# Patient Record
Sex: Male | Born: 1935
Health system: Southern US, Community
[De-identification: ages and names within clinical notes are randomized; demographics above are authoritative.]

## PROBLEM LIST (undated history)

## (undated) DIAGNOSIS — I77810 Thoracic aortic ectasia: Secondary | ICD-10-CM

## (undated) DIAGNOSIS — G250 Essential tremor: Principal | ICD-10-CM

## (undated) DIAGNOSIS — Z8719 Personal history of other diseases of the digestive system: Secondary | ICD-10-CM

## (undated) DIAGNOSIS — H3530 Unspecified macular degeneration: Secondary | ICD-10-CM

## (undated) DIAGNOSIS — G459 Transient cerebral ischemic attack, unspecified: Secondary | ICD-10-CM

## (undated) DIAGNOSIS — C801 Malignant (primary) neoplasm, unspecified: Secondary | ICD-10-CM

## (undated) DIAGNOSIS — M1990 Unspecified osteoarthritis, unspecified site: Secondary | ICD-10-CM

## (undated) DIAGNOSIS — N183 Chronic kidney disease, stage 3 unspecified: Secondary | ICD-10-CM

## (undated) DIAGNOSIS — G709 Myoneural disorder, unspecified: Secondary | ICD-10-CM

## (undated) DIAGNOSIS — I272 Pulmonary hypertension, unspecified: Secondary | ICD-10-CM

## (undated) DIAGNOSIS — I495 Sick sinus syndrome: Secondary | ICD-10-CM

## (undated) DIAGNOSIS — K469 Unspecified abdominal hernia without obstruction or gangrene: Secondary | ICD-10-CM

## (undated) DIAGNOSIS — Z950 Presence of cardiac pacemaker: Secondary | ICD-10-CM

## (undated) DIAGNOSIS — M545 Low back pain: Secondary | ICD-10-CM

## (undated) DIAGNOSIS — I480 Paroxysmal atrial fibrillation: Secondary | ICD-10-CM

## (undated) DIAGNOSIS — I5032 Chronic diastolic (congestive) heart failure: Secondary | ICD-10-CM

## (undated) DIAGNOSIS — G8929 Other chronic pain: Secondary | ICD-10-CM

## (undated) DIAGNOSIS — I499 Cardiac arrhythmia, unspecified: Secondary | ICD-10-CM

## (undated) DIAGNOSIS — K219 Gastro-esophageal reflux disease without esophagitis: Secondary | ICD-10-CM

## (undated) DIAGNOSIS — D649 Anemia, unspecified: Secondary | ICD-10-CM

## (undated) DIAGNOSIS — I2510 Atherosclerotic heart disease of native coronary artery without angina pectoris: Secondary | ICD-10-CM

## (undated) DIAGNOSIS — E785 Hyperlipidemia, unspecified: Secondary | ICD-10-CM

## (undated) DIAGNOSIS — I10 Essential (primary) hypertension: Secondary | ICD-10-CM

## (undated) DIAGNOSIS — I712 Thoracic aortic aneurysm, without rupture, unspecified: Secondary | ICD-10-CM

## (undated) DIAGNOSIS — D696 Thrombocytopenia, unspecified: Secondary | ICD-10-CM

## (undated) HISTORY — DX: Other chronic pain: G89.29

## (undated) HISTORY — DX: Thoracic aortic ectasia: I77.810

## (undated) HISTORY — DX: Essential tremor: G25.0

## (undated) HISTORY — PX: HERNIA REPAIR: SHX51

## (undated) HISTORY — DX: Pulmonary hypertension, unspecified: I27.20

## (undated) HISTORY — DX: Anemia, unspecified: D64.9

## (undated) HISTORY — DX: Low back pain: M54.5

## (undated) HISTORY — PX: APPENDECTOMY: SHX54

## (undated) HISTORY — PX: EYE SURGERY: SHX253

## (undated) HISTORY — DX: Thoracic aortic aneurysm, without rupture, unspecified: I71.20

## (undated) HISTORY — DX: Thrombocytopenia, unspecified: D69.6

## (undated) HISTORY — DX: Chronic diastolic (congestive) heart failure: I50.32

---

## 1999-05-01 ENCOUNTER — Other Ambulatory Visit: Admission: RE | Admit: 1999-05-01 | Discharge: 1999-05-01 | Payer: Self-pay | Admitting: *Deleted

## 2004-09-12 ENCOUNTER — Encounter (INDEPENDENT_AMBULATORY_CARE_PROVIDER_SITE_OTHER): Payer: Self-pay | Admitting: Specialist

## 2004-09-12 ENCOUNTER — Ambulatory Visit (HOSPITAL_COMMUNITY): Admission: RE | Admit: 2004-09-12 | Discharge: 2004-09-12 | Payer: Self-pay | Admitting: Urology

## 2006-04-24 ENCOUNTER — Encounter: Admission: RE | Admit: 2006-04-24 | Discharge: 2006-04-24 | Payer: Self-pay | Admitting: Urology

## 2006-04-28 ENCOUNTER — Ambulatory Visit (HOSPITAL_BASED_OUTPATIENT_CLINIC_OR_DEPARTMENT_OTHER): Admission: RE | Admit: 2006-04-28 | Discharge: 2006-04-28 | Payer: Self-pay | Admitting: Urology

## 2006-04-28 ENCOUNTER — Encounter (INDEPENDENT_AMBULATORY_CARE_PROVIDER_SITE_OTHER): Payer: Self-pay | Admitting: *Deleted

## 2006-06-25 ENCOUNTER — Ambulatory Visit (HOSPITAL_COMMUNITY): Admission: RE | Admit: 2006-06-25 | Discharge: 2006-06-25 | Payer: Self-pay | Admitting: Internal Medicine

## 2006-06-25 ENCOUNTER — Encounter: Payer: Self-pay | Admitting: Vascular Surgery

## 2007-04-29 ENCOUNTER — Ambulatory Visit: Payer: Self-pay | Admitting: Internal Medicine

## 2007-05-08 ENCOUNTER — Ambulatory Visit: Payer: Self-pay | Admitting: Internal Medicine

## 2007-05-15 ENCOUNTER — Ambulatory Visit: Payer: Self-pay | Admitting: Internal Medicine

## 2007-06-26 ENCOUNTER — Ambulatory Visit: Payer: Self-pay | Admitting: Internal Medicine

## 2007-10-18 ENCOUNTER — Encounter
Admission: RE | Admit: 2007-10-18 | Discharge: 2007-10-18 | Payer: Self-pay | Admitting: Physical Medicine and Rehabilitation

## 2007-10-29 ENCOUNTER — Ambulatory Visit: Payer: Self-pay | Admitting: Internal Medicine

## 2008-02-23 ENCOUNTER — Encounter: Payer: Self-pay | Admitting: Internal Medicine

## 2011-05-14 NOTE — Assessment & Plan Note (Signed)
[facility]                             PULMONARY OFFICE NOTE   Douglas Walker, Douglas Walker                   MRN:          [id number]  DATE:06/26/2007                            DOB:          [birth year]    PROBLEMS:  1. History of pneumonia.  2. Chronic bronchitis.  3. Rhinitis.   HISTORY:  Still coughing, but he remains much better than immediately  after his pneumonia. He and his wife report no new problems saying that  he is very active working outdoors all day. Dr. Kevan Ny tried changing  Propranolol to Metoprolol, but unfortunately that made no difference in  his cough which produces just scant white sputum. On careful  questioning, he denied reflux, indigestion, chest pain, or wheeze, and  he is really minimally aware of exertional dyspnea. He feels that he can  go on as he is indefinitely.   MEDICATIONS:  1. Simvastatin 40 mg.  2. Aspirin 81 mg x2.  3. Ocuvite.  4. Symbicort 160/4.5.  5. Metoprolol 25 mg b.i.d.  6. Gaviscon.   ALLERGIES:  No medication allergy.   OBJECTIVE:  Weight 244 pounds, blood pressure 136/78, pulse 54, room air  saturation 94%. Mild raspy airway rattle bilaterally, but unlabored. No  strider. No dullness. Heart sounds are regular without murmur. There is  no edema.   IMPRESSION:  Chronic bronchitis.   PLAN:  Mucinex. Continue Symbicort 160/4.5. Try sample of Singular 10  mg. Schedule return 4 months, earlier p.r.n.     Clinton D. Maple Hudson, MD, Tonny Bollman, FACP  Electronically Signed    CDY/MedQ  DD: 06/28/2007  DT: 06/29/2007  Job #: [id number]   cc:   Candyce Churn, M.D.

## 2011-05-14 NOTE — Assessment & Plan Note (Signed)
[facility]                             PULMONARY OFFICE NOTE   EVELYN, MOCH                   MRN:          [id number]  DATE:05/15/2007                            DOB:          Sep 25, 1925    PROBLEM:  1. Bronchitis, status post pneumonia.  2. Rhinitis.   HISTORY:  Mr. Petrik and his wife come back extremely pleased with his  improvement.  He is still coughing but has got his sense of energy  mostly back. He describes being able to get up, work some in his garden,  wash his car, etc.  He is not recognizing problems from the seasonal  pollens. Not coughing up much of anything.   MEDICATIONS:  Simvastatin 40 mg, aspirin 81 mg, Ocu-Vite, Symbicort  160/4.5, Propranolol 60 mg.   ALLERGIES:  No medication allergy.   OBJECTIVE:  VITAL SIGNS:  Weight 243 pounds, pulse regular 62, room air  saturation 96%.  GENERAL:  Raspy bronchitic type cough without focal dullness or wheeze,  no adenopathy, no stridor.  CARDIAC:  Heart sounds regular without murmur.   Chest x-ray on April 30 showed no acute cardiopulmonary disease, mild  hyperinflation could related to COPD and minimal left base scar.  Pulmonary function testing here on May 08, 2007:  Mild obstructive  disease mainly in small airways with insignificant response to  bronchodilator, normal lung volumes. Diffusion was moderately reduced.  His FEV1 was 2.09 (70% of predicted), and FEV1:FVC ratio was 0.66.  Diffusion was 64% of predicted.   IMPRESSION:  Residual bronchitis. He responded to the nebulizer  treatment of Depo-Medrol last time.  We discussed these and expectations  for persistent improvement with the decision that we would repeat it  once.   PLAN:  Xopenex 1.25 mg nebulizer treatments.  Depo-Medrol 80 mg IM.  Continue Symbicort.   Schedule return in 6 weeks, earlier p.r.n.     Clinton D. Maple Hudson, MD, Tonny Bollman, FACP  Electronically Signed    CDY/MedQ  DD: 05/16/2007  DT:  05/16/2007  Job #: [id number]   cc:   Candyce Churn, M.D.

## 2011-05-14 NOTE — Assessment & Plan Note (Signed)
[facility]                             PULMONARY OFFICE NOTE   Douglas Walker, Douglas Walker                   MRN:          [id number]  DATE:10/29/2007                            DOB:          [birth year]    PROBLEM:  1. History of pneumonia.  2. Chronic bronchitis.  3. Rhinitis.   HISTORY:  Four-month followup.  He is most bothered currently by  degenerative disc disease with MRI and pending epidural injection.  Occasional cough, not often, produces only scant white sputum.  He  plans flu vaccine at Dr. Isac Walker office.  No acute respiratory issues  related to the fall weather.  He seems satisfied with his pulmonary  status currently.   MEDICATIONS:  1. Simvastatin 40 mg.  2. Aspirin 81 mg x2.  3. Ocuvite.  4. Symbicort 160/4.5.  5. Metoprolol 25 mg b.i.d.  6. Gaviscon p.r.n.  7. Hydrocodone for his back.  8. He has not been using an acute respiratory inhaler.   ALLERGIES:  No medication allergies.   CLINICAL DATA:  Chest x-ray in April showed no acute cardiopulmonary  disease with mild hyperinflation, minimal left base scar.   OBJECTIVE:  VITAL SIGNS:  Weight 252 pounds, noting further increase.  Blood pressure 130/78.  Pulse 52.  Room air saturation 98%.  Pulse is  regular without murmur or gallop.  NECK:  There is no neck vein distention, adenopathy or peripheral edema.  CHEST:  Is very quiet.  I do not hear rales, rhonchi or wheeze.   IMPRESSION:  Chronic bronchitis, fairly well controlled, currently.  He  is very sensitive to cough because of his back trouble, which is a good  measure that he is not having significant bronchitis symptoms currently.  He has felt stable enough that I have suggested that he follow for his  primary care with Dr. Kevan Walker and return to see me p.r.n. He thought this  was a good approach.  I will be happy to see him if I can be helpful.     Douglas D. Maple Hudson, MD, Douglas Walker, FACP  Electronically Signed    CDY/MedQ  DD: 11/01/2007  DT: 11/02/2007  Job #: [id number]   cc:   Douglas Walker, M.D.

## 2011-05-14 NOTE — Assessment & Plan Note (Signed)
[facility]                             PULMONARY OFFICE NOTE   Douglas Walker, Douglas Walker                   MRN:          [id number]  DATE:04/29/2007                            DOB:          [birth year]    PULMONARY CONSULTATION:   PROBLEM:  [age]-year-old man seen at the kind request of Dr.  Kevan Ny in pulmonary consultation because of persistent cough and  bronchitis syndrome after pneumonia.   HISTORY:  His wife and son are patients here.  He denies significant  respiratory complaints before a pneumonia diagnosed by Dr. Kevan Ny in  January.  He has had several office visits since, multiple antibiotics,  at least 1 predinsone taper.  He has never really cleared, although he  did feel better.  He complains that he gives out.  This weekend he was  doing better, some gardening.  He still feels a rattle and congestion,  especially supine, and his wife says he will wheeze.  To avoid this, he  has tried to sleep propped up.  Phlegm has never been more than trace-  yellow, although cough has remained productive.  There has been no blood  and no fever, at least recently.  Chest just feels somewhat tight.  He  has had no chest pain.  He had a stress test by Dr. Katrinka Blazing fine.  No  fevers or sweats, no gasping, but he feels that he is using just part of  his lung.  He has never had an experience like this.   MEDICATIONS:  1. Simvastatin 40 mg.  2. Aspirin 81 mg x2.  3. Ocuvite two daily.  4. Benicar HCT 12.5 mg.  5. Symbicort 160/4.5.  6. P.r.n. use of Gaviscon and Zyrtec.   ALLERGIES:  No medication allergy.   REVIEW OF SYSTEMS:  Dyspnea with rest and activity, productive cough,  some anorexia, nasal congestion, no real pain, no palpitation,  adenopathy, purulent or bloody discharge.   PAST HISTORY:  1. Esophageal reflux treated with Gaviscon.  2. Bladder cancer resected in the past by Dr. Isabel Caprice, now clear.  3. Elevated cholesterol.  4. No prior  pneumonia or other lung disease and no history of      allergies.  5. He has had pneumococcal vaccine.  6. Appendectomy.  7. No intolerance to Latex, contrast dye or aspirin.   SOCIAL HISTORY:  He quit smoking in 1975.  Married with grown children.  He is retired from work as a Lawyer.  No exposure to  asbestos.   FAMILY HISTORY:  Wife is on chemotherapy for cervical cancer, metastatic  to lung.  Son Brett Canales is our patient with allergy problems.  No blood  relatives with other respiratory complaints.   OBJECTIVE:  Weight 240 pounds.  BP 136/80, pulse regular at 72, room air  saturation 95%.  This is an obese man with a rattling, wheezy, congested cough, but not  using accessory muscles.  There is no stridor, no dullness or pleural rub.  Heart sounds are regular.  I do not hear a murmur or gallop.  There is  no neck vein  distention and no peripheral edema.  I do not find adenopathy.  He does some have moderate nasal congestion with no visible postnasal  drainage.   CHEST X-RAY:  He brings films from [facility] [place].  Chest film of  April of this year shows faint residual infiltrate at the left base,  improved from March.  On the earliest films, there is a suggestion of a  little bit of interstitial prominence in the left base.  The overall  impression would be of a resolving pneumonia, but with some question of  underlying disease.   IMPRESSION:  1. Resolving pneumonia with residual bronchitis.  2. Watch for underlying disease, left lower lobe, in this man who quit      smoking 30 years ago.  3. Rhinitis with uncertain components of sinusitis versus seasonal      allergy.   PLAN:  1. Nebulizer treatments with Xopenex 1.25 mg with Depo-Medrol 80 mg      IM.  2. Doxycycline for 10 days.  3. Chest x-ray.  4. Schedule PFT.  5. He was using Symbicort just one puff b.i.d. and I have asked him to      increase to two puffs b.i.d. with mouth care.  6. Continue  Zyrtec if helpful.  7. Schedule return in 3 weeks, earlier p.r.n.   I appreciate the chance to meet him and hope we can be helpful.     Clinton D. Maple Hudson, MD, Tonny Bollman, FACP  Electronically Signed    CDY/MedQ  DD: 04/29/2007  DT: 04/30/2007  Job #: [id number]   cc:   Candyce Churn, M.D.

## 2011-05-17 NOTE — Op Note (Signed)
NAME:  Douglas Walker, Douglas Walker NO.:  [id number]   MEDICAL RECORD NO.:  [id number]          PATIENT TYPE:  AMB   LOCATION:  [facility]                         FACILITY:  [facility]   PHYSICIAN:  Valetta Fuller, M.D.  DATE OF BIRTH:  08-10-1925   DATE OF PROCEDURE:  04/28/2006  DATE OF DISCHARGE:                                 OPERATIVE REPORT   PREOPERATIVE DIAGNOSIS:  History of transitional cell carcinoma with recent  atypical urine cytology.   POSTOPERATIVE DIAGNOSIS:  History of transitional cell carcinoma with recent  atypical urine cytology.   OPERATION/PROCEDURE:  Cystoscopy, bilateral retrograde pyelogram, bladder  washings for cytology with random biopsies x3 and fulguration.   SURGEON:  Valetta Fuller, M.D.   ANESTHESIA:  General.   INDICATIONS:  Mr. Doro has had a history of transitional cell carcinoma  of the bladder.  He has had tumor originally diagnosed in September 2005.  The tumor was low grade and noninvasive.  The patient has never had a  recurrence.  Recent cystoscopy was unremarkable but urine cytology showed  moderate atypia.  We felt that he needed further evaluation to make sure  there was no obvious issues.   TECHNIQUE AND FINDINGS:  The patient was brought to the operating room where  he had the successful induction of general anesthesia.  He was placed in the  lithotomy position and prepped and draped in the usual manner.  Cystoscopy  revealed moderate trilobar hyperplasia and some chronic mild trabecular  change.   Attention was turned to retrograde pyelography.  A [place] cone-tipped  catheter was used.  Real time fluoroscopy was used for bilateral retrograde  pyelograms.  Both ureters and collecting systems were noted to be delicate  without evidence of obstruction, filling defector other abnormalities.  There were interpreted in real time.   Attention was then turned to the bladder itself.  Saline barbotage was  performed and sent for  cytology.  We then took cold cut biopsies of the  trigone, right and left lateral walls of the bladder which were then  fulgurated.  The patient appeared to tolerate the procedure well.  There  were no obvious complications or difficulties.           ______________________________  Valetta Fuller, M.D.  Electronically Signed     DSG/MEDQ  D:  04/28/2006  T:  04/29/2006  Job:  [id number]

## 2011-05-17 NOTE — Op Note (Signed)
NAME:  Douglas Walker, Douglas Walker                      ACCOUNT NO.:  [id number]   MEDICAL RECORD NO.:  [id number]                   PATIENT TYPE:  AMB   LOCATION:  DAY                                  [facility]ACILITY:  [facility]   PHYSICIAN:  Valetta Fuller, M.D.               DATE OF BIRTH:  10/29/1925   DATE OF PROCEDURE:  09/12/2004  DATE OF DISCHARGE:                                 OPERATIVE REPORT   PREOPERATIVE DIAGNOSIS:  Bladder tumor.   POSTOPERATIVE DIAGNOSIS:  Bladder tumor.   PROCEDURE PERFORMED:  1.  Cystoscopy.  2.  Transurethral resection of bladder tumor (2 cm).   SURGEON:  Valetta Fuller, M.D.   RESIDENT:  Thyra Breed, M.D.   ANESTHESIA:  General endotracheal anesthesia.   DRAINS:  20 French Foley catheter straight drain.   COMPLICATIONS:  None.   INDICATIONS FOR PROCEDURE:  Douglas Walker is a 77 year old male who was found  on a hematuria evaluation to have evidence of a bladder tumor.  Specifically, CT scan of his upper tracts was normal.  Office cystoscopy  demonstrated a papillary appearing tumor near the right ureteral orifice,  approximately 2 cm.  He is brought to the operating room at this time to  undergo transurethral resection of his tumor.  In addition, he understands  we may have to place a stent given the proximity of the tumor to his  ureteral orifice.  He understands the risks, benefits, and alternatives of  the procedure including bleeding, infection, damage to adjacent structures,  failure of the procedure, damage to the ureter, as well as risks of  anesthesia.  Informed consent was obtained.   PROCEDURE IN DETAIL:  Following identification by his arm bracelet, the  patient was brought to the operating room and placed in supine position.  Here, he underwent successful induction of general endotracheal anesthesia  and received preoperative IV antibiotics.  He was then moved to the dorsal  lithotomy position.  We initially placed a 12 degree cystoscopic  lens  through a 22.5 French sheath transurethrally.  This revealed a normal  appearing anterior and posterior urethra.  Upon entering the bladder, there  was bleeding noted which was coming from the base of the papillary tumor  just posterolateral to the right ureteral orifice.  Further cystoscopic  investigation of the bladder revealed no other evidence of tumors, mucosal  irregularities, bodies or stones.  The cystoscope was then removed.  Sissy Hoff sounds were then used to dilate to approximately 30 [place].  The  resectoscope sheath with obturator was then placed transurethrally into the  bladder.  The resectoscope was then assembled and continuous flow utilized.  In fact, upon initially draining the bladder through the resectoscope  sheath, most of the friable tumor exited through the resectoscope sheath.  Upon re-entry into the bladder, there was a small base of the papillary  stalk left posterolateral to the right ureteral  orifice.  Using the loop, a  deep bite of mucosa including muscle was taken for pathologic analysis.  The  edges were then fulgurated using the loop cautery.  There was no further  evidence of bleeding.  Indigo carmine had been given preoperatively and the  right ureteral orifice was seen to be effluxing without difficulty,  therefore, with no damage to the right ureteral orifice.  Therefore, we  elected not to place a stent.  However, we did elect to place a Foley  catheter postoperatively given the deep bite taken for biopsy.  The bladder  was then drained, the resectoscope removed, a 20 French Foley catheter was  placed.  15 mL of fluid were used to fill the balloon.  The Foley catheter  was then irrigated.  A few small blood clots were removed and the irrigant  was seen to be clear.  This marked the termination of the procedure.  The  patient tolerated the procedure well and there were no complications.  Please note that Dr. Isabel Caprice was present and participated  in the entire  procedure as he was the responsible surgeon.   DISPOSITION:  After awakening from general anesthesia, the patient was  transferred to the post anesthesia care unit in stable condition.  From  here, he will be transferred to home.  He is asked to call [phone] for a  return appointment with Dr. Isabel Caprice at which time his catheter will be  removed.  He is given a prescription for Vicodin, #30, as well as Cipro to  begin one day prior to his appointment in preparation for Foley catheter  removal.     Thyra Breed, MD                            Valetta Fuller, M.D.    EG/MEDQ  D:  09/12/2004  T:  09/12/2004  Job:  [id number]

## 2012-01-27 DIAGNOSIS — Z7901 Long term (current) use of anticoagulants: Secondary | ICD-10-CM | POA: Diagnosis not present

## 2012-01-27 DIAGNOSIS — R5383 Other fatigue: Secondary | ICD-10-CM | POA: Diagnosis not present

## 2012-01-27 DIAGNOSIS — R059 Cough, unspecified: Secondary | ICD-10-CM | POA: Diagnosis not present

## 2012-01-27 DIAGNOSIS — I4891 Unspecified atrial fibrillation: Secondary | ICD-10-CM | POA: Diagnosis not present

## 2012-01-27 DIAGNOSIS — R5381 Other malaise: Secondary | ICD-10-CM | POA: Diagnosis not present

## 2012-01-27 DIAGNOSIS — I10 Essential (primary) hypertension: Secondary | ICD-10-CM | POA: Diagnosis not present

## 2012-01-27 DIAGNOSIS — I5030 Unspecified diastolic (congestive) heart failure: Secondary | ICD-10-CM | POA: Diagnosis not present

## 2012-01-27 DIAGNOSIS — R05 Cough: Secondary | ICD-10-CM | POA: Diagnosis not present

## 2012-02-03 DIAGNOSIS — R5381 Other malaise: Secondary | ICD-10-CM | POA: Diagnosis not present

## 2012-02-03 DIAGNOSIS — I10 Essential (primary) hypertension: Secondary | ICD-10-CM | POA: Diagnosis not present

## 2012-02-03 DIAGNOSIS — R059 Cough, unspecified: Secondary | ICD-10-CM | POA: Diagnosis not present

## 2012-02-03 DIAGNOSIS — R05 Cough: Secondary | ICD-10-CM | POA: Diagnosis not present

## 2012-02-03 DIAGNOSIS — I5030 Unspecified diastolic (congestive) heart failure: Secondary | ICD-10-CM | POA: Diagnosis not present

## 2012-02-11 DIAGNOSIS — I4891 Unspecified atrial fibrillation: Secondary | ICD-10-CM | POA: Diagnosis not present

## 2012-02-11 DIAGNOSIS — Z7901 Long term (current) use of anticoagulants: Secondary | ICD-10-CM | POA: Diagnosis not present

## 2012-02-14 DIAGNOSIS — Z7901 Long term (current) use of anticoagulants: Secondary | ICD-10-CM | POA: Diagnosis not present

## 2012-02-14 DIAGNOSIS — I4891 Unspecified atrial fibrillation: Secondary | ICD-10-CM | POA: Diagnosis not present

## 2012-02-24 DIAGNOSIS — H353290 Exudative age-related macular degeneration, unspecified eye, stage unspecified: Secondary | ICD-10-CM | POA: Diagnosis not present

## 2012-02-24 DIAGNOSIS — H35059 Retinal neovascularization, unspecified, unspecified eye: Secondary | ICD-10-CM | POA: Diagnosis not present

## 2012-02-25 DIAGNOSIS — I4891 Unspecified atrial fibrillation: Secondary | ICD-10-CM | POA: Diagnosis not present

## 2012-02-25 DIAGNOSIS — Z7901 Long term (current) use of anticoagulants: Secondary | ICD-10-CM | POA: Diagnosis not present

## 2012-03-17 DIAGNOSIS — I4891 Unspecified atrial fibrillation: Secondary | ICD-10-CM | POA: Diagnosis not present

## 2012-03-17 DIAGNOSIS — Z7901 Long term (current) use of anticoagulants: Secondary | ICD-10-CM | POA: Diagnosis not present

## 2012-04-07 DIAGNOSIS — N3020 Other chronic cystitis without hematuria: Secondary | ICD-10-CM | POA: Diagnosis not present

## 2012-04-07 DIAGNOSIS — N401 Enlarged prostate with lower urinary tract symptoms: Secondary | ICD-10-CM | POA: Diagnosis not present

## 2012-04-07 DIAGNOSIS — Z8551 Personal history of malignant neoplasm of bladder: Secondary | ICD-10-CM | POA: Diagnosis not present

## 2012-04-07 DIAGNOSIS — N138 Other obstructive and reflux uropathy: Secondary | ICD-10-CM | POA: Diagnosis not present

## 2012-04-13 DIAGNOSIS — Z7901 Long term (current) use of anticoagulants: Secondary | ICD-10-CM | POA: Diagnosis not present

## 2012-04-13 DIAGNOSIS — I4891 Unspecified atrial fibrillation: Secondary | ICD-10-CM | POA: Diagnosis not present

## 2012-04-27 DIAGNOSIS — H353290 Exudative age-related macular degeneration, unspecified eye, stage unspecified: Secondary | ICD-10-CM | POA: Diagnosis not present

## 2012-04-27 DIAGNOSIS — H35059 Retinal neovascularization, unspecified, unspecified eye: Secondary | ICD-10-CM | POA: Diagnosis not present

## 2012-05-11 DIAGNOSIS — I4891 Unspecified atrial fibrillation: Secondary | ICD-10-CM | POA: Diagnosis not present

## 2012-05-11 DIAGNOSIS — Z7901 Long term (current) use of anticoagulants: Secondary | ICD-10-CM | POA: Diagnosis not present

## 2012-06-05 DIAGNOSIS — I4891 Unspecified atrial fibrillation: Secondary | ICD-10-CM | POA: Diagnosis not present

## 2012-06-05 DIAGNOSIS — Z7901 Long term (current) use of anticoagulants: Secondary | ICD-10-CM | POA: Diagnosis not present

## 2012-06-25 DIAGNOSIS — H353290 Exudative age-related macular degeneration, unspecified eye, stage unspecified: Secondary | ICD-10-CM | POA: Diagnosis not present

## 2012-06-25 DIAGNOSIS — H35059 Retinal neovascularization, unspecified, unspecified eye: Secondary | ICD-10-CM | POA: Diagnosis not present

## 2012-06-29 DIAGNOSIS — Z7901 Long term (current) use of anticoagulants: Secondary | ICD-10-CM | POA: Diagnosis not present

## 2012-06-29 DIAGNOSIS — I4891 Unspecified atrial fibrillation: Secondary | ICD-10-CM | POA: Diagnosis not present

## 2012-07-27 DIAGNOSIS — Z7901 Long term (current) use of anticoagulants: Secondary | ICD-10-CM | POA: Diagnosis not present

## 2012-07-27 DIAGNOSIS — I4891 Unspecified atrial fibrillation: Secondary | ICD-10-CM | POA: Diagnosis not present

## 2012-08-10 DIAGNOSIS — Z7901 Long term (current) use of anticoagulants: Secondary | ICD-10-CM | POA: Diagnosis not present

## 2012-08-10 DIAGNOSIS — I4891 Unspecified atrial fibrillation: Secondary | ICD-10-CM | POA: Diagnosis not present

## 2012-08-10 DIAGNOSIS — I5030 Unspecified diastolic (congestive) heart failure: Secondary | ICD-10-CM | POA: Diagnosis not present

## 2012-08-10 DIAGNOSIS — I10 Essential (primary) hypertension: Secondary | ICD-10-CM | POA: Diagnosis not present

## 2012-08-19 DIAGNOSIS — I10 Essential (primary) hypertension: Secondary | ICD-10-CM | POA: Diagnosis not present

## 2012-08-19 DIAGNOSIS — Z0000 Encounter for general adult medical examination without abnormal findings: Secondary | ICD-10-CM | POA: Diagnosis not present

## 2012-08-19 DIAGNOSIS — I4891 Unspecified atrial fibrillation: Secondary | ICD-10-CM | POA: Diagnosis not present

## 2012-08-19 DIAGNOSIS — Z1331 Encounter for screening for depression: Secondary | ICD-10-CM | POA: Diagnosis not present

## 2012-08-19 DIAGNOSIS — E782 Mixed hyperlipidemia: Secondary | ICD-10-CM | POA: Diagnosis not present

## 2012-08-19 DIAGNOSIS — C44319 Basal cell carcinoma of skin of other parts of face: Secondary | ICD-10-CM | POA: Diagnosis not present

## 2012-08-19 DIAGNOSIS — I5030 Unspecified diastolic (congestive) heart failure: Secondary | ICD-10-CM | POA: Diagnosis not present

## 2012-08-19 DIAGNOSIS — N400 Enlarged prostate without lower urinary tract symptoms: Secondary | ICD-10-CM | POA: Diagnosis not present

## 2012-08-24 DIAGNOSIS — I4891 Unspecified atrial fibrillation: Secondary | ICD-10-CM | POA: Diagnosis not present

## 2012-08-24 DIAGNOSIS — Z7901 Long term (current) use of anticoagulants: Secondary | ICD-10-CM | POA: Diagnosis not present

## 2012-08-26 DIAGNOSIS — H35059 Retinal neovascularization, unspecified, unspecified eye: Secondary | ICD-10-CM | POA: Diagnosis not present

## 2012-08-26 DIAGNOSIS — H353290 Exudative age-related macular degeneration, unspecified eye, stage unspecified: Secondary | ICD-10-CM | POA: Diagnosis not present

## 2012-08-27 DIAGNOSIS — I10 Essential (primary) hypertension: Secondary | ICD-10-CM | POA: Diagnosis not present

## 2012-09-04 DIAGNOSIS — H353290 Exudative age-related macular degeneration, unspecified eye, stage unspecified: Secondary | ICD-10-CM | POA: Diagnosis not present

## 2012-09-07 DIAGNOSIS — E782 Mixed hyperlipidemia: Secondary | ICD-10-CM | POA: Diagnosis not present

## 2012-09-11 DIAGNOSIS — I4891 Unspecified atrial fibrillation: Secondary | ICD-10-CM | POA: Diagnosis not present

## 2012-09-11 DIAGNOSIS — Z7901 Long term (current) use of anticoagulants: Secondary | ICD-10-CM | POA: Diagnosis not present

## 2012-09-14 DIAGNOSIS — C44319 Basal cell carcinoma of skin of other parts of face: Secondary | ICD-10-CM | POA: Diagnosis not present

## 2012-10-16 DIAGNOSIS — E782 Mixed hyperlipidemia: Secondary | ICD-10-CM | POA: Diagnosis not present

## 2012-10-16 DIAGNOSIS — Z7901 Long term (current) use of anticoagulants: Secondary | ICD-10-CM | POA: Diagnosis not present

## 2012-10-16 DIAGNOSIS — I4891 Unspecified atrial fibrillation: Secondary | ICD-10-CM | POA: Diagnosis not present

## 2012-10-23 DIAGNOSIS — I4891 Unspecified atrial fibrillation: Secondary | ICD-10-CM | POA: Diagnosis not present

## 2012-10-23 DIAGNOSIS — Z7901 Long term (current) use of anticoagulants: Secondary | ICD-10-CM | POA: Diagnosis not present

## 2012-10-28 DIAGNOSIS — H353290 Exudative age-related macular degeneration, unspecified eye, stage unspecified: Secondary | ICD-10-CM | POA: Diagnosis not present

## 2012-11-06 DIAGNOSIS — Z7901 Long term (current) use of anticoagulants: Secondary | ICD-10-CM | POA: Diagnosis not present

## 2012-11-06 DIAGNOSIS — I4891 Unspecified atrial fibrillation: Secondary | ICD-10-CM | POA: Diagnosis not present

## 2012-12-04 DIAGNOSIS — Z23 Encounter for immunization: Secondary | ICD-10-CM | POA: Diagnosis not present

## 2012-12-04 DIAGNOSIS — Z7901 Long term (current) use of anticoagulants: Secondary | ICD-10-CM | POA: Diagnosis not present

## 2012-12-04 DIAGNOSIS — I4891 Unspecified atrial fibrillation: Secondary | ICD-10-CM | POA: Diagnosis not present

## 2012-12-09 DIAGNOSIS — H35059 Retinal neovascularization, unspecified, unspecified eye: Secondary | ICD-10-CM | POA: Diagnosis not present

## 2012-12-09 DIAGNOSIS — H35359 Cystoid macular degeneration, unspecified eye: Secondary | ICD-10-CM | POA: Diagnosis not present

## 2012-12-09 DIAGNOSIS — H353290 Exudative age-related macular degeneration, unspecified eye, stage unspecified: Secondary | ICD-10-CM | POA: Diagnosis not present

## 2013-01-04 DIAGNOSIS — Z7901 Long term (current) use of anticoagulants: Secondary | ICD-10-CM | POA: Diagnosis not present

## 2013-01-04 DIAGNOSIS — I4891 Unspecified atrial fibrillation: Secondary | ICD-10-CM | POA: Diagnosis not present

## 2013-01-04 LAB — PROTIME-INR: INR: 1.7 — AB (ref 0.9–1.1)

## 2013-01-26 DIAGNOSIS — I4891 Unspecified atrial fibrillation: Secondary | ICD-10-CM | POA: Diagnosis not present

## 2013-01-26 DIAGNOSIS — Z7901 Long term (current) use of anticoagulants: Secondary | ICD-10-CM | POA: Diagnosis not present

## 2013-02-02 DIAGNOSIS — H35059 Retinal neovascularization, unspecified, unspecified eye: Secondary | ICD-10-CM | POA: Diagnosis not present

## 2013-02-02 DIAGNOSIS — H353290 Exudative age-related macular degeneration, unspecified eye, stage unspecified: Secondary | ICD-10-CM | POA: Diagnosis not present

## 2013-02-04 DIAGNOSIS — H353290 Exudative age-related macular degeneration, unspecified eye, stage unspecified: Secondary | ICD-10-CM | POA: Diagnosis not present

## 2013-02-04 DIAGNOSIS — H44009 Unspecified purulent endophthalmitis, unspecified eye: Secondary | ICD-10-CM | POA: Diagnosis not present

## 2013-02-04 DIAGNOSIS — H35059 Retinal neovascularization, unspecified, unspecified eye: Secondary | ICD-10-CM | POA: Diagnosis not present

## 2013-02-04 DIAGNOSIS — H35359 Cystoid macular degeneration, unspecified eye: Secondary | ICD-10-CM | POA: Diagnosis not present

## 2013-02-05 DIAGNOSIS — H44009 Unspecified purulent endophthalmitis, unspecified eye: Secondary | ICD-10-CM | POA: Diagnosis not present

## 2013-02-05 DIAGNOSIS — Z0000 Encounter for general adult medical examination without abnormal findings: Secondary | ICD-10-CM | POA: Diagnosis not present

## 2013-02-05 DIAGNOSIS — H20049 Secondary noninfectious iridocyclitis, unspecified eye: Secondary | ICD-10-CM | POA: Diagnosis not present

## 2013-02-08 DIAGNOSIS — H18239 Secondary corneal edema, unspecified eye: Secondary | ICD-10-CM | POA: Diagnosis not present

## 2013-02-12 ENCOUNTER — Emergency Department (INDEPENDENT_AMBULATORY_CARE_PROVIDER_SITE_OTHER): Payer: Medicare Other

## 2013-02-12 ENCOUNTER — Emergency Department (INDEPENDENT_AMBULATORY_CARE_PROVIDER_SITE_OTHER)
Admission: EM | Admit: 2013-02-12 | Discharge: 2013-02-12 | Disposition: A | Discharge status: Home / Self Care | Payer: Medicare Other | Source: Home / Self Care | Attending: Family Medicine | Admitting: Family Medicine

## 2013-02-12 ENCOUNTER — Encounter (HOSPITAL_COMMUNITY): Payer: Self-pay | Admitting: *Deleted

## 2013-02-12 DIAGNOSIS — N390 Urinary tract infection, site not specified: Secondary | ICD-10-CM

## 2013-02-12 DIAGNOSIS — R509 Fever, unspecified: Secondary | ICD-10-CM | POA: Diagnosis not present

## 2013-02-12 HISTORY — DX: Atherosclerotic heart disease of native coronary artery without angina pectoris: I25.10

## 2013-02-12 HISTORY — DX: Essential (primary) hypertension: I10

## 2013-02-12 HISTORY — DX: Hyperlipidemia, unspecified: E78.5

## 2013-02-12 LAB — CBC WITH DIFFERENTIAL/PLATELET
Basophils Absolute: 0 10*3/uL (ref 0.0–0.1)
Eosinophils Absolute: 0.1 10*3/uL (ref 0.0–0.7)
Lymphocytes Relative: 10 % — ABNORMAL LOW (ref 12–46)
Lymphs Abs: 0.9 10*3/uL (ref 0.7–4.0)
MCH: 32 pg (ref 26.0–34.0)
MCV: 93.9 fL (ref 78.0–100.0)
Monocytes Absolute: 0.6 10*3/uL (ref 0.1–1.0)
Monocytes Relative: 7 % (ref 3–12)
Neutro Abs: 6.8 10*3/uL (ref 1.7–7.7)
Neutrophils Relative %: 81 % — ABNORMAL HIGH (ref 43–77)
RBC: 4.25 MIL/uL (ref 4.22–5.81)

## 2013-02-12 LAB — POCT URINALYSIS DIP (DEVICE)
Nitrite: NEGATIVE
Protein, ur: 100 mg/dL — AB
Urobilinogen, UA: 1 mg/dL (ref 0.0–1.0)

## 2013-02-12 MED ORDER — CEPHALEXIN 500 MG PO CAPS: 500.0000 mg | ORAL_CAPSULE | Freq: Four times a day (QID) | ORAL | Status: DC

## 2013-02-12 NOTE — ED Notes (Signed)
Pt reports sudden onset of generalized body aches and extreme headache. Denies problems with urination, coughing or chest pain. Does report nighttime chills.

## 2013-02-12 NOTE — ED Provider Notes (Signed)
History     CSN: [id number]  Arrival date & time 02/12/13  1051   First MD Initiated Contact with Patient 02/12/13 1401      Chief Complaint  Patient presents with  . Generalized Body Aches    (Consider location/radiation/quality/duration/timing/severity/associated sxs/prior treatment) HPI Comments: Pt developed body aches 2 days ago.  Denies any other sx except maybe mild cough.  Simply states he does not feel good.   Patient is a 79 y.o. male presenting with general illness. The history is provided by the patient and a relative.  Illness  The current episode started 2 days ago. The onset was sudden. The problem occurs continuously. The problem has been unchanged. The problem is moderate. Nothing relieves the symptoms. Nothing aggravates the symptoms. Associated symptoms include a fever, muscle aches and cough. Pertinent negatives include no abdominal pain, no diarrhea, no nausea, no vomiting, no congestion, no ear pain, no rhinorrhea, no sore throat, no swollen glands, no neck pain, no URI, no rash, no eye discharge, no eye pain and no eye redness. He has been eating less than usual. Urine output has been normal.    Past Medical History  Diagnosis Date  . Hypertension   . Hyperlipemia   . Coronary artery disease     History reviewed. No pertinent past surgical history.  Family History  Problem Relation Age of Onset  . Family history unknown: Yes    History  Substance Use Topics  . Smoking status: Never Smoker   . Smokeless tobacco: Not on file  . Alcohol Use: No      Review of Systems  Constitutional: Positive for fever and chills.  HENT: Negative for ear pain, congestion, sore throat, rhinorrhea and neck pain.   Eyes: Negative for pain, discharge and redness.  Respiratory: Positive for cough.   Gastrointestinal: Negative for nausea, vomiting, abdominal pain and diarrhea.  Genitourinary: Negative for dysuria and flank pain.  Musculoskeletal: Positive for  myalgias.  Skin: Negative for rash.    Allergies  Review of patient's allergies indicates no known allergies.  Home Medications   Current Outpatient Rx  Name  Route  Sig  Dispense  Refill  . aspirin 81 MG tablet   Oral   Take 81 mg by mouth daily.         . cephALEXin (KEFLEX) 500 MG capsule   Oral   Take 1 capsule (500 mg total) by mouth 4 (four) times daily.   28 capsule   0   . metoprolol tartrate (LOPRESSOR) 25 MG tablet   Oral   Take 25 mg by mouth daily.         . rosuvastatin (CRESTOR) 20 MG tablet   Oral   Take 20 mg by mouth. M/w/f only         . simvastatin (ZOCOR) 40 MG tablet   Oral   Take 40 mg by mouth every evening.         . Tamsulosin HCl (FLOMAX) 0.4 MG CAPS   Oral   Take by mouth daily.         . valsartan-hydrochlorothiazide (DIOVAN-HCT) 160-25 MG per tablet   Oral   Take 1 tablet by mouth daily.         Marland Kitchen warfarin (COUMADIN) 2.5 MG tablet   Oral   Take 2.5 mg by mouth daily. 2.5 mg on m/w/f 5.0 on t/th/s/s/           BP 114/68  Pulse 66  Temp(Src) 100.8 F (  38.2 C) (Oral)  Resp 22  SpO2 97%  Physical Exam  Constitutional: He appears well-developed and well-nourished. No distress.  HENT:  Right Ear: Tympanic membrane, external ear and ear canal normal.  Left Ear: Tympanic membrane, external ear and ear canal normal.  Nose: Nose normal.  Mouth/Throat: Oropharynx is clear and moist.  Neck: Normal range of motion. Neck supple.  Cardiovascular: Normal rate and regular rhythm.   Pulmonary/Chest: Effort normal and breath sounds normal.  Abdominal: Soft. Bowel sounds are normal.  Lymphadenopathy:    He has no cervical adenopathy.  Skin: Skin is warm and dry. No rash noted.    ED Course  Procedures (including critical care time)  Labs Reviewed  CBC WITH DIFFERENTIAL - Abnormal; Notable for the following:    Platelets 80 (*)    Neutrophils Relative 81 (*)    Lymphocytes Relative 10 (*)    All other components  within normal limits  POCT URINALYSIS DIP (DEVICE) - Abnormal; Notable for the following:    Bilirubin Urine SMALL (*)    Ketones, ur TRACE (*)    Hgb urine dipstick MODERATE (*)    Protein, ur 100 (*)    Leukocytes, UA TRACE (*)    All other components within normal limits  URINE CULTURE   Dg Chest 2 View  02/12/2013  *RADIOLOGY REPORT*  Clinical Data: Fever and body aches.  CHEST - 2 VIEW  Comparison: PA and lateral chest 01/27/2012 and 08/15/2010.  Findings: The chest is hyperexpanded.  Lungs are clear.  Heart size is upper normal.  No pneumothorax or pleural effusion. The aorta is ectatic.  IMPRESSION: No acute finding.   Original Report Authenticated By: Holley Dexter, M.D.      1. UTI (lower urinary tract infection)       MDM  Pt sx c/w infection. WBC count normal, cxr normal. Urine shows some leuk and blood.  Sx could be from uti or from viral infection.  Will tx with keflex and have f/u with pcp next week. Reviewed concerning sx/reasons to go to ER.         Cathlyn Parsons, NP 02/12/13 1447

## 2013-02-13 NOTE — ED Provider Notes (Signed)
Medical screening examination/treatment/procedure(s) were performed by non-physician practitioner and as supervising physician I was immediately available for consultation/collaboration.   [facility]; MD  Sharin Grave, MD 02/13/13 [phone]

## 2013-02-15 ENCOUNTER — Telehealth (HOSPITAL_COMMUNITY): Payer: Self-pay | Admitting: *Deleted

## 2013-02-15 LAB — URINE CULTURE

## 2013-02-15 MED ORDER — SULFAMETHOXAZOLE-TRIMETHOPRIM 800-160 MG PO TABS: 1.0000 | ORAL_TABLET | Freq: Two times a day (BID) | ORAL | Status: DC

## 2013-02-15 NOTE — ED Notes (Signed)
Urine culture: 25,000 colonies E. Coli. Confirmed Extended Spectrum Beta-Lactamase producer ( ESBL).  It is resistant to Keflex.  Dr. Artis Flock notified and changed antibiotic to Septra DS.  He  e-prescribed Rx. to [facility] on [address].  I called and pt. was in the shower.  Pt.'s wife verified and given result.  Instructed her to tell him to stop the Keflex and start and take all of the Septra DS.  She said he can't drive at night but he will get it in the morning. Douglas Walker 02/15/2013

## 2013-02-22 DIAGNOSIS — I10 Essential (primary) hypertension: Secondary | ICD-10-CM | POA: Diagnosis not present

## 2013-02-22 DIAGNOSIS — N390 Urinary tract infection, site not specified: Secondary | ICD-10-CM | POA: Diagnosis not present

## 2013-02-22 DIAGNOSIS — Z79899 Other long term (current) drug therapy: Secondary | ICD-10-CM | POA: Diagnosis not present

## 2013-02-22 DIAGNOSIS — E782 Mixed hyperlipidemia: Secondary | ICD-10-CM | POA: Diagnosis not present

## 2013-02-22 DIAGNOSIS — R0789 Other chest pain: Secondary | ICD-10-CM | POA: Diagnosis not present

## 2013-02-22 DIAGNOSIS — R5381 Other malaise: Secondary | ICD-10-CM | POA: Diagnosis not present

## 2013-02-23 DIAGNOSIS — Z7901 Long term (current) use of anticoagulants: Secondary | ICD-10-CM | POA: Diagnosis not present

## 2013-02-23 DIAGNOSIS — I4891 Unspecified atrial fibrillation: Secondary | ICD-10-CM | POA: Diagnosis not present

## 2013-02-23 LAB — PROTIME-INR: INR: 3.7 — AB (ref 0.9–1.1)

## 2013-03-03 DIAGNOSIS — Z7901 Long term (current) use of anticoagulants: Secondary | ICD-10-CM | POA: Diagnosis not present

## 2013-03-03 DIAGNOSIS — I4891 Unspecified atrial fibrillation: Secondary | ICD-10-CM | POA: Diagnosis not present

## 2013-03-03 DIAGNOSIS — R0789 Other chest pain: Secondary | ICD-10-CM | POA: Diagnosis not present

## 2013-03-10 DIAGNOSIS — H353190 Nonexudative age-related macular degeneration, unspecified eye, stage unspecified: Secondary | ICD-10-CM | POA: Diagnosis not present

## 2013-03-10 DIAGNOSIS — H353290 Exudative age-related macular degeneration, unspecified eye, stage unspecified: Secondary | ICD-10-CM | POA: Diagnosis not present

## 2013-03-12 DIAGNOSIS — R5381 Other malaise: Secondary | ICD-10-CM | POA: Diagnosis not present

## 2013-03-12 DIAGNOSIS — I10 Essential (primary) hypertension: Secondary | ICD-10-CM | POA: Diagnosis not present

## 2013-03-12 DIAGNOSIS — E782 Mixed hyperlipidemia: Secondary | ICD-10-CM | POA: Diagnosis not present

## 2013-03-12 DIAGNOSIS — Z79899 Other long term (current) drug therapy: Secondary | ICD-10-CM | POA: Diagnosis not present

## 2013-03-12 DIAGNOSIS — N390 Urinary tract infection, site not specified: Secondary | ICD-10-CM | POA: Diagnosis not present

## 2013-03-12 DIAGNOSIS — I4891 Unspecified atrial fibrillation: Secondary | ICD-10-CM | POA: Diagnosis not present

## 2013-03-12 DIAGNOSIS — G609 Hereditary and idiopathic neuropathy, unspecified: Secondary | ICD-10-CM | POA: Diagnosis not present

## 2013-03-16 ENCOUNTER — Telehealth: Payer: Self-pay | Admitting: Internal Medicine

## 2013-03-16 NOTE — Telephone Encounter (Signed)
C/D 03/16/13 for appt. 04/06/13

## 2013-03-16 NOTE — Telephone Encounter (Signed)
S/W pt in re NP appt 4/9 @ 1:30 w/Dr. Arbutus Ped.  Referring Dr. Kevan Ny Dx- Abn labwork; low plts Welcome packet mailed.

## 2013-03-19 DIAGNOSIS — I4891 Unspecified atrial fibrillation: Secondary | ICD-10-CM | POA: Diagnosis not present

## 2013-03-19 DIAGNOSIS — Z7901 Long term (current) use of anticoagulants: Secondary | ICD-10-CM | POA: Diagnosis not present

## 2013-04-06 ENCOUNTER — Ambulatory Visit: Payer: Medicare Other

## 2013-04-06 ENCOUNTER — Encounter: Payer: Self-pay | Admitting: Internal Medicine

## 2013-04-06 ENCOUNTER — Other Ambulatory Visit (HOSPITAL_BASED_OUTPATIENT_CLINIC_OR_DEPARTMENT_OTHER): Payer: Medicare Other | Admitting: Lab

## 2013-04-06 ENCOUNTER — Ambulatory Visit (HOSPITAL_BASED_OUTPATIENT_CLINIC_OR_DEPARTMENT_OTHER): Payer: Medicare Other | Admitting: Internal Medicine

## 2013-04-06 ENCOUNTER — Telehealth: Payer: Self-pay | Admitting: Internal Medicine

## 2013-04-06 VITALS — BP 182/75 | HR 61 | Temp 97.0°F | Resp 20 | Ht 70.0 in | Wt 242.9 lb

## 2013-04-06 DIAGNOSIS — D696 Thrombocytopenia, unspecified: Secondary | ICD-10-CM

## 2013-04-06 DIAGNOSIS — N390 Urinary tract infection, site not specified: Secondary | ICD-10-CM | POA: Diagnosis not present

## 2013-04-06 LAB — CBC WITH DIFFERENTIAL/PLATELET
Basophils Absolute: 0 10*3/uL (ref 0.0–0.1)
EOS%: 2 % (ref 0.0–7.0)
HGB: 12 g/dL — ABNORMAL LOW (ref 13.0–17.1)
LYMPH%: 23.4 % (ref 14.0–49.0)
MCHC: 32.4 g/dL (ref 32.0–36.0)
MONO#: 0.5 10*3/uL (ref 0.1–0.9)
MONO%: 8.5 % (ref 0.0–14.0)
RBC: 3.89 10*6/uL — ABNORMAL LOW (ref 4.20–5.82)
RDW: 16 % — ABNORMAL HIGH (ref 11.0–14.6)
lymph#: 1.3 10*3/uL (ref 0.9–3.3)

## 2013-04-06 LAB — COMPREHENSIVE METABOLIC PANEL
ALT: 21 U/L (ref 0–53)
Alkaline Phosphatase: 72 U/L (ref 39–117)
BUN: 22 mg/dL (ref 6–23)
CO2: 25 mEq/L (ref 19–32)
Creatinine, Ser: 1.45 mg/dL — ABNORMAL HIGH (ref 0.50–1.35)
Potassium: 4.1 mEq/L (ref 3.5–5.3)
Sodium: 139 mEq/L (ref 135–145)
Total Bilirubin: 1.1 mg/dL (ref 0.3–1.2)

## 2013-04-06 NOTE — Progress Notes (Signed)
[facility] [phone]   [phone]) [phone]  CONSULT NOTE  REFERRING PHYSICIAN: Dr. Marden Noble  REASON FOR CONSULTATION:  79 years old white male with low platelets count  HPI Douglas Walker is a 79 y.o. male was past medical history significant for hypertension, dyslipidemia, GERD,COPD, atrial fibrillation as well as history of localized bladder cancer and TIA. The patient has been complaining recently of increasing fatigue and weakness as well as flulike symptoms and chest congestion. He was seen at one of the urgent care center and treated for urinary tract infection with Bactrim but he was allergic to it. This was switched to Keflex by his primary care physician. During his evaluation CBC was performed on 03/12/2013 and showed platelets count were down to 85,000. He was referred to me today for evaluation and recommendation regarding his low platelets count. The patient had low platelet count since 2008 but has always been above 100,000. He denied having any significant bleeding problems but he has some ecchymosis secondary to treatment with Coumadin. He has no bleeding per rectum. The patient has been on treatment for dyslipidemia with Zocor which was which it for a short period to Crestor but was discontinued recently secondary to myopathy.  He does not take any over-the-counter medication especially NSAIDs. He takes aspirin 81 mg by mouth daily. The patient denied having any significant weight loss or night sweats. He denied having any chest pain, shortness breath, cough or hemoptysis. His family history is unremarkable for any blood disease or leukemia. The patient is married and has one son, Douglas Walker who accompanied him to the visit today. He is currently retired and used to work for [employer]. He has remote history of smoking but quit more than 4 years ago, no history of alcohol or drug abuse.  @SFHPI @  Past Medical History  Diagnosis Date  .  Hypertension   . Hyperlipemia   . Coronary artery disease     History reviewed. No pertinent family history.  Social History History  Substance Use Topics  . Smoking status: Former Smoker    Quit date: 12/30/1962  . Smokeless tobacco: Not on file  . Alcohol Use: No    Allergies  Allergen Reactions  . Sulfa Antibiotics     Weakness, dizziness    Current Outpatient Prescriptions  Medication Sig Dispense Refill  . aspirin 81 MG tablet Take 81 mg by mouth daily.      . cephALEXin (KEFLEX) 500 MG capsule Take 1 capsule (500 mg total) by mouth 4 (four) times daily.  28 capsule  0  . metoprolol tartrate (LOPRESSOR) 25 MG tablet Take 25 mg by mouth daily.      . rosuvastatin (CRESTOR) 20 MG tablet Take 20 mg by mouth. M/w/f only      . Tamsulosin HCl (FLOMAX) 0.4 MG CAPS Take by mouth daily.      Marland Kitchen warfarin (COUMADIN) 2.5 MG tablet Take 2.5 mg by mouth daily. 2.5 mg on m/w/f 5.0 on t/th/s/s/       No current facility-administered medications for this visit.    Review of Systems  A comprehensive review of systems was negative except for: Constitutional: positive for fatigue  Physical Exam  AVW:UJWJX, healthy, no distress, well nourished and well developed SKIN: skin color, texture, turgor are normal HEAD: Normocephalic, No masses, lesions, tenderness or abnormalities EYES: normal EARS: External ears normal OROPHARYNX:no exudate and no erythema  NECK: supple, no adenopathy LYMPH:  no palpable lymphadenopathy, no  hepatosplenomegaly LUNGS: clear to auscultation  HEART: regular rate & rhythm and no murmurs ABDOMEN:abdomen soft, non-tender, normal bowel sounds and no masses or organomegaly BACK: Back symmetric, no curvature. EXTREMITIES:no joint deformities, effusion, or inflammation, no edema  NEURO: alert & oriented x 3 with fluent speech, no focal motor/sensory deficits  PERFORMANCE STATUS: ECOG 1  LABORATORY DATA: Lab Results  Component Value Date   WBC 5.6 04/06/2013    HGB 12.0* 04/06/2013   HCT 37.0* 04/06/2013   MCV 95.1 04/06/2013   PLT 107* 04/06/2013      Chemistry   No results found for this basename: NA, K, CL, CO2, BUN, CREATININE, GLU   No results found for this basename: CALCIUM, ALKPHOS, AST, ALT, BILITOT       RADIOGRAPHIC STUDIES: No results found.  ASSESSMENT: This is a very pleasant 79 years old white male with persistent thrombocytopenia most likely drug-induced (Zocor) plus/minus mild ITP. The patient had thrombocytopenia for more than 6 years and currently asymptomatic.   PLAN: I had a lengthy discussion with the patient and his son today about his condition. His platelets count is better today up to 107,000 I recommended for him to continue holding his Zocor for now I would see the patient back for follow up visit in one month with repeat CBC. If he has any further decline in his platelets count, I will consider the patient for further investigation but for now he is currently asymptomatic and he will continue on observation. He was advised to call me immediately if he has any concerning symptoms in the interval. All questions were answered. The patient knows to call the clinic with any problems, questions or concerns. We can certainly see the patient much sooner if necessary.  Thank you so much for allowing me to participate in the care of Douglas Walker. I will continue to follow up the patient with you and assist in his care.  I spent 30 minutes counseling the patient face to face. The total time spent in the appointment was 55 minutes.  Zyrah Wiswell K. 04/06/2013, 2:09 PM

## 2013-04-06 NOTE — Patient Instructions (Signed)
Your low platelets count are most likely secondary to medications especially Zocor. Continue to hold Zocor for now. Repeat CBC in one month.

## 2013-04-09 DIAGNOSIS — N138 Other obstructive and reflux uropathy: Secondary | ICD-10-CM | POA: Diagnosis not present

## 2013-04-09 DIAGNOSIS — Z7901 Long term (current) use of anticoagulants: Secondary | ICD-10-CM | POA: Diagnosis not present

## 2013-04-09 DIAGNOSIS — N401 Enlarged prostate with lower urinary tract symptoms: Secondary | ICD-10-CM | POA: Diagnosis not present

## 2013-04-09 DIAGNOSIS — Z8551 Personal history of malignant neoplasm of bladder: Secondary | ICD-10-CM | POA: Diagnosis not present

## 2013-04-09 DIAGNOSIS — N3020 Other chronic cystitis without hematuria: Secondary | ICD-10-CM | POA: Diagnosis not present

## 2013-04-09 DIAGNOSIS — I4891 Unspecified atrial fibrillation: Secondary | ICD-10-CM | POA: Diagnosis not present

## 2013-04-14 DIAGNOSIS — H353290 Exudative age-related macular degeneration, unspecified eye, stage unspecified: Secondary | ICD-10-CM | POA: Diagnosis not present

## 2013-04-14 DIAGNOSIS — H35059 Retinal neovascularization, unspecified, unspecified eye: Secondary | ICD-10-CM | POA: Diagnosis not present

## 2013-04-23 DIAGNOSIS — N183 Chronic kidney disease, stage 3 unspecified: Secondary | ICD-10-CM | POA: Diagnosis not present

## 2013-04-23 DIAGNOSIS — I10 Essential (primary) hypertension: Secondary | ICD-10-CM | POA: Diagnosis not present

## 2013-04-23 DIAGNOSIS — G609 Hereditary and idiopathic neuropathy, unspecified: Secondary | ICD-10-CM | POA: Diagnosis not present

## 2013-04-23 DIAGNOSIS — R946 Abnormal results of thyroid function studies: Secondary | ICD-10-CM | POA: Diagnosis not present

## 2013-04-23 DIAGNOSIS — I4891 Unspecified atrial fibrillation: Secondary | ICD-10-CM | POA: Diagnosis not present

## 2013-04-23 DIAGNOSIS — R5381 Other malaise: Secondary | ICD-10-CM | POA: Diagnosis not present

## 2013-04-23 DIAGNOSIS — E782 Mixed hyperlipidemia: Secondary | ICD-10-CM | POA: Diagnosis not present

## 2013-04-23 DIAGNOSIS — Z7901 Long term (current) use of anticoagulants: Secondary | ICD-10-CM | POA: Diagnosis not present

## 2013-04-23 DIAGNOSIS — N390 Urinary tract infection, site not specified: Secondary | ICD-10-CM | POA: Diagnosis not present

## 2013-04-23 DIAGNOSIS — R5383 Other fatigue: Secondary | ICD-10-CM | POA: Diagnosis not present

## 2013-04-23 DIAGNOSIS — D696 Thrombocytopenia, unspecified: Secondary | ICD-10-CM | POA: Diagnosis not present

## 2013-05-04 ENCOUNTER — Encounter: Payer: Self-pay | Admitting: Internal Medicine

## 2013-05-04 ENCOUNTER — Ambulatory Visit (HOSPITAL_BASED_OUTPATIENT_CLINIC_OR_DEPARTMENT_OTHER): Payer: Medicare Other | Admitting: Internal Medicine

## 2013-05-04 ENCOUNTER — Other Ambulatory Visit (HOSPITAL_BASED_OUTPATIENT_CLINIC_OR_DEPARTMENT_OTHER): Payer: Medicare Other | Admitting: Lab

## 2013-05-04 VITALS — BP 137/71 | HR 63 | Temp 98.5°F | Resp 18 | Ht 70.0 in | Wt 232.6 lb

## 2013-05-04 DIAGNOSIS — D696 Thrombocytopenia, unspecified: Secondary | ICD-10-CM

## 2013-05-04 DIAGNOSIS — N390 Urinary tract infection, site not specified: Secondary | ICD-10-CM | POA: Diagnosis not present

## 2013-05-04 LAB — CBC WITH DIFFERENTIAL/PLATELET
Basophils Absolute: 0 10*3/uL (ref 0.0–0.1)
Eosinophils Absolute: 0.2 10*3/uL (ref 0.0–0.5)
HGB: 13.1 g/dL (ref 13.0–17.1)
MCV: 94.6 fL (ref 79.3–98.0)
MONO%: 12.1 % (ref 0.0–14.0)
NEUT#: 3.1 10*3/uL (ref 1.5–6.5)
RDW: 14.7 % — ABNORMAL HIGH (ref 11.0–14.6)
lymph#: 1.3 10*3/uL (ref 0.9–3.3)

## 2013-05-04 NOTE — Progress Notes (Signed)
[facility] [phone]   [phone]  OFFICE PROGRESS NOTE  Hollice Espy, MD [address] [place] [address]  DIAGNOSIS: Thrombocytopenia most likely drug-induced plus/minus ITP  PRIOR THERAPY: None  CURRENT THERAPY: Observation   INTERVAL HISTORY: Douglas Walker 79 y.o. male returns to the clinic today for followup visit accompanied by his son. The patient is feeling fine today with no specific complaints. He denied having any significant bleeding issues. He continues to have some bruises especially in the upper extremities secondary to his Coumadin treatment. He denied having any weight loss or night sweats. He has no chest pain but continues to have shortness breath with exertion, no cough or hemoptysis. Dr. Kevan Ny discontinued his treatment with Zocor for the last few weeks. The patient is here today for reevaluation with repeat CBC.  MEDICAL HISTORY: Past Medical History  Diagnosis Date  . Hypertension   . Hyperlipemia   . Coronary artery disease     ALLERGIES:  is allergic to sulfa antibiotics.  MEDICATIONS:  Current Outpatient Prescriptions  Medication Sig Dispense Refill  . aspirin 81 MG tablet Take 81 mg by mouth daily.      . cephALEXin (KEFLEX) 500 MG capsule Take 1 capsule (500 mg total) by mouth 4 (four) times daily.  28 capsule  0  . metoprolol tartrate (LOPRESSOR) 25 MG tablet Take 25 mg by mouth daily.      . rosuvastatin (CRESTOR) 20 MG tablet Take 20 mg by mouth. M/w/f only      . Tamsulosin HCl (FLOMAX) 0.4 MG CAPS Take by mouth daily.      Marland Kitchen warfarin (COUMADIN) 2.5 MG tablet Take 2.5 mg by mouth daily. 2.5 mg on m/w/f 5.0 on t/th/s/s/       No current facility-administered medications for this visit.    REVIEW OF SYSTEMS:  A comprehensive review of systems was negative.   PHYSICAL EXAMINATION: General appearance: alert, cooperative and no distress Head: Normocephalic, without obvious  abnormality, atraumatic Neck: no adenopathy Lymph nodes: Cervical, supraclavicular, and axillary nodes normal. Resp: clear to auscultation bilaterally Cardio: regular rate and rhythm, S1, S2 normal, no murmur, click, rub or gallop GI: soft, non-tender; bowel sounds normal; no masses,  no organomegaly Extremities: extremities normal, atraumatic, no cyanosis or edema  ECOG PERFORMANCE STATUS: 1 - Symptomatic but completely ambulatory  Blood pressure 137/71, pulse 63, temperature 98.5 F (36.9 C), temperature source Oral, resp. rate 18, height 5\' 10"  (1.778 m), weight 232 lb 9.6 oz (105.507 kg).  LABORATORY DATA: Lab Results  Component Value Date   WBC 5.3 05/04/2013   HGB 13.1 05/04/2013   HCT 39.9 05/04/2013   MCV 94.6 05/04/2013   PLT 116* 05/04/2013      Chemistry      Component Value Date/Time   NA 139 04/06/2013 1322   K 4.1 04/06/2013 1322   CL 105 04/06/2013 1322   CO2 25 04/06/2013 1322   BUN 22 04/06/2013 1322   CREATININE 1.45* 04/06/2013 1322      Component Value Date/Time   CALCIUM 9.2 04/06/2013 1322   ALKPHOS 72 04/06/2013 1322   AST 18 04/06/2013 1322   ALT 21 04/06/2013 1322   BILITOT 1.1 04/06/2013 1322       RADIOGRAPHIC STUDIES: No results found.  ASSESSMENT: This is a very pleasant 79 years old white male with thrombocytopenia, drug-induced plus/minus ITP. He has improvement in his platelets count of the discontinued Zocor for the  last few weeks.   PLAN: I discussed the lab result with the patient today. I recommended for him to continue on observation with regular followup visit with his primary care physician Dr. Kevan Ny. I don't see a need to see the patient on a regular basis unless his platelets count is less than 50,000 or he has significant bleeding issues. The patient and his son agreed to the current plan.  All questions were answered. The patient knows to call the clinic with any problems, questions or concerns. We can certainly see the patient much sooner if  necessary.

## 2013-05-04 NOTE — Patient Instructions (Signed)
There is improvement in her platelets count if that he discontinue treatment with Zocor. Followup visit with your primary care physician as scheduled.

## 2013-05-31 DIAGNOSIS — Z7901 Long term (current) use of anticoagulants: Secondary | ICD-10-CM | POA: Diagnosis not present

## 2013-05-31 DIAGNOSIS — I4891 Unspecified atrial fibrillation: Secondary | ICD-10-CM | POA: Diagnosis not present

## 2013-06-28 DIAGNOSIS — Z7901 Long term (current) use of anticoagulants: Secondary | ICD-10-CM | POA: Diagnosis not present

## 2013-06-28 DIAGNOSIS — I4891 Unspecified atrial fibrillation: Secondary | ICD-10-CM | POA: Diagnosis not present

## 2013-07-23 DIAGNOSIS — G609 Hereditary and idiopathic neuropathy, unspecified: Secondary | ICD-10-CM | POA: Diagnosis not present

## 2013-07-23 DIAGNOSIS — R5381 Other malaise: Secondary | ICD-10-CM | POA: Diagnosis not present

## 2013-07-23 DIAGNOSIS — E669 Obesity, unspecified: Secondary | ICD-10-CM | POA: Diagnosis not present

## 2013-07-23 DIAGNOSIS — R5383 Other fatigue: Secondary | ICD-10-CM | POA: Diagnosis not present

## 2013-07-23 DIAGNOSIS — R946 Abnormal results of thyroid function studies: Secondary | ICD-10-CM | POA: Diagnosis not present

## 2013-07-23 DIAGNOSIS — I10 Essential (primary) hypertension: Secondary | ICD-10-CM | POA: Diagnosis not present

## 2013-07-23 DIAGNOSIS — D696 Thrombocytopenia, unspecified: Secondary | ICD-10-CM | POA: Diagnosis not present

## 2013-07-23 DIAGNOSIS — E782 Mixed hyperlipidemia: Secondary | ICD-10-CM | POA: Diagnosis not present

## 2013-07-23 DIAGNOSIS — Z7901 Long term (current) use of anticoagulants: Secondary | ICD-10-CM | POA: Diagnosis not present

## 2013-07-23 DIAGNOSIS — I4891 Unspecified atrial fibrillation: Secondary | ICD-10-CM | POA: Diagnosis not present

## 2013-08-10 DIAGNOSIS — Z7901 Long term (current) use of anticoagulants: Secondary | ICD-10-CM | POA: Diagnosis not present

## 2013-08-10 DIAGNOSIS — I5030 Unspecified diastolic (congestive) heart failure: Secondary | ICD-10-CM | POA: Diagnosis not present

## 2013-08-10 DIAGNOSIS — I4430 Unspecified atrioventricular block: Secondary | ICD-10-CM | POA: Diagnosis not present

## 2013-08-10 DIAGNOSIS — I4891 Unspecified atrial fibrillation: Secondary | ICD-10-CM | POA: Diagnosis not present

## 2013-08-10 DIAGNOSIS — I10 Essential (primary) hypertension: Secondary | ICD-10-CM | POA: Diagnosis not present

## 2013-08-17 DIAGNOSIS — I4430 Unspecified atrioventricular block: Secondary | ICD-10-CM | POA: Diagnosis not present

## 2013-08-17 DIAGNOSIS — Z7901 Long term (current) use of anticoagulants: Secondary | ICD-10-CM | POA: Diagnosis not present

## 2013-08-17 DIAGNOSIS — I5030 Unspecified diastolic (congestive) heart failure: Secondary | ICD-10-CM | POA: Diagnosis not present

## 2013-08-17 DIAGNOSIS — I10 Essential (primary) hypertension: Secondary | ICD-10-CM | POA: Diagnosis not present

## 2013-08-17 DIAGNOSIS — I4891 Unspecified atrial fibrillation: Secondary | ICD-10-CM | POA: Diagnosis not present

## 2013-08-20 DIAGNOSIS — I4891 Unspecified atrial fibrillation: Secondary | ICD-10-CM | POA: Diagnosis not present

## 2013-08-23 DIAGNOSIS — I4891 Unspecified atrial fibrillation: Secondary | ICD-10-CM | POA: Diagnosis not present

## 2013-08-25 DIAGNOSIS — I4891 Unspecified atrial fibrillation: Secondary | ICD-10-CM | POA: Diagnosis not present

## 2013-08-25 DIAGNOSIS — Z7901 Long term (current) use of anticoagulants: Secondary | ICD-10-CM | POA: Diagnosis not present

## 2013-08-25 DIAGNOSIS — Z961 Presence of intraocular lens: Secondary | ICD-10-CM | POA: Diagnosis not present

## 2013-08-25 DIAGNOSIS — Z947 Corneal transplant status: Secondary | ICD-10-CM | POA: Diagnosis not present

## 2013-09-15 DIAGNOSIS — Z7901 Long term (current) use of anticoagulants: Secondary | ICD-10-CM | POA: Diagnosis not present

## 2013-09-15 DIAGNOSIS — I4891 Unspecified atrial fibrillation: Secondary | ICD-10-CM | POA: Diagnosis not present

## 2013-09-28 ENCOUNTER — Other Ambulatory Visit: Payer: Self-pay | Admitting: Dermatology

## 2013-09-28 DIAGNOSIS — D2320 Other benign neoplasm of skin of unspecified ear and external auricular canal: Secondary | ICD-10-CM | POA: Diagnosis not present

## 2013-09-28 DIAGNOSIS — L821 Other seborrheic keratosis: Secondary | ICD-10-CM | POA: Diagnosis not present

## 2013-09-28 DIAGNOSIS — D239 Other benign neoplasm of skin, unspecified: Secondary | ICD-10-CM | POA: Diagnosis not present

## 2013-09-28 DIAGNOSIS — C44519 Basal cell carcinoma of skin of other part of trunk: Secondary | ICD-10-CM | POA: Diagnosis not present

## 2013-09-28 DIAGNOSIS — Z85828 Personal history of other malignant neoplasm of skin: Secondary | ICD-10-CM | POA: Diagnosis not present

## 2013-09-28 DIAGNOSIS — C44611 Basal cell carcinoma of skin of unspecified upper limb, including shoulder: Secondary | ICD-10-CM | POA: Diagnosis not present

## 2013-09-28 DIAGNOSIS — D485 Neoplasm of uncertain behavior of skin: Secondary | ICD-10-CM | POA: Diagnosis not present

## 2013-09-28 DIAGNOSIS — D230 Other benign neoplasm of skin of lip: Secondary | ICD-10-CM | POA: Diagnosis not present

## 2013-09-28 DIAGNOSIS — L570 Actinic keratosis: Secondary | ICD-10-CM | POA: Diagnosis not present

## 2013-10-15 DIAGNOSIS — Z23 Encounter for immunization: Secondary | ICD-10-CM | POA: Diagnosis not present

## 2013-10-28 ENCOUNTER — Ambulatory Visit (INDEPENDENT_AMBULATORY_CARE_PROVIDER_SITE_OTHER): Payer: Medicare Other | Admitting: Pharmacist

## 2013-10-28 DIAGNOSIS — I480 Paroxysmal atrial fibrillation: Secondary | ICD-10-CM | POA: Insufficient documentation

## 2013-10-28 DIAGNOSIS — I4891 Unspecified atrial fibrillation: Secondary | ICD-10-CM | POA: Diagnosis not present

## 2013-10-28 HISTORY — DX: Paroxysmal atrial fibrillation: I48.0

## 2013-11-02 ENCOUNTER — Encounter (HOSPITAL_COMMUNITY): Payer: Self-pay | Admitting: Emergency Medicine

## 2013-11-02 ENCOUNTER — Emergency Department (HOSPITAL_COMMUNITY)
Admission: EM | Admit: 2013-11-02 | Discharge: 2013-11-02 | Disposition: A | Discharge status: Home / Self Care | Payer: Medicare Other | Attending: Emergency Medicine | Admitting: Emergency Medicine

## 2013-11-02 ENCOUNTER — Emergency Department (HOSPITAL_COMMUNITY): Payer: Medicare Other

## 2013-11-02 DIAGNOSIS — Z8673 Personal history of transient ischemic attack (TIA), and cerebral infarction without residual deficits: Secondary | ICD-10-CM | POA: Diagnosis not present

## 2013-11-02 DIAGNOSIS — Z87891 Personal history of nicotine dependence: Secondary | ICD-10-CM | POA: Insufficient documentation

## 2013-11-02 DIAGNOSIS — K219 Gastro-esophageal reflux disease without esophagitis: Secondary | ICD-10-CM | POA: Insufficient documentation

## 2013-11-02 DIAGNOSIS — Z7901 Long term (current) use of anticoagulants: Secondary | ICD-10-CM | POA: Diagnosis not present

## 2013-11-02 DIAGNOSIS — Z7982 Long term (current) use of aspirin: Secondary | ICD-10-CM | POA: Insufficient documentation

## 2013-11-02 DIAGNOSIS — R404 Transient alteration of awareness: Secondary | ICD-10-CM | POA: Diagnosis not present

## 2013-11-02 DIAGNOSIS — Z8639 Personal history of other endocrine, nutritional and metabolic disease: Secondary | ICD-10-CM | POA: Insufficient documentation

## 2013-11-02 DIAGNOSIS — Z862 Personal history of diseases of the blood and blood-forming organs and certain disorders involving the immune mechanism: Secondary | ICD-10-CM | POA: Diagnosis not present

## 2013-11-02 DIAGNOSIS — I10 Essential (primary) hypertension: Secondary | ICD-10-CM | POA: Diagnosis not present

## 2013-11-02 DIAGNOSIS — Z8669 Personal history of other diseases of the nervous system and sense organs: Secondary | ICD-10-CM | POA: Insufficient documentation

## 2013-11-02 DIAGNOSIS — R55 Syncope and collapse: Secondary | ICD-10-CM | POA: Diagnosis not present

## 2013-11-02 DIAGNOSIS — E860 Dehydration: Secondary | ICD-10-CM | POA: Diagnosis not present

## 2013-11-02 DIAGNOSIS — Z79899 Other long term (current) drug therapy: Secondary | ICD-10-CM | POA: Diagnosis not present

## 2013-11-02 DIAGNOSIS — N289 Disorder of kidney and ureter, unspecified: Secondary | ICD-10-CM | POA: Diagnosis not present

## 2013-11-02 DIAGNOSIS — I2510 Atherosclerotic heart disease of native coronary artery without angina pectoris: Secondary | ICD-10-CM | POA: Insufficient documentation

## 2013-11-02 DIAGNOSIS — R61 Generalized hyperhidrosis: Secondary | ICD-10-CM | POA: Diagnosis not present

## 2013-11-02 DIAGNOSIS — R5381 Other malaise: Secondary | ICD-10-CM | POA: Diagnosis not present

## 2013-11-02 HISTORY — DX: Gastro-esophageal reflux disease without esophagitis: K21.9

## 2013-11-02 HISTORY — DX: Unspecified abdominal hernia without obstruction or gangrene: K46.9

## 2013-11-02 HISTORY — DX: Transient cerebral ischemic attack, unspecified: G45.9

## 2013-11-02 HISTORY — DX: Unspecified macular degeneration: H35.30

## 2013-11-02 LAB — CBC WITH DIFFERENTIAL/PLATELET
Basophils Absolute: 0 10*3/uL (ref 0.0–0.1)
Eosinophils Absolute: 0.1 10*3/uL (ref 0.0–0.7)
Eosinophils Relative: 2 % (ref 0–5)
Lymphs Abs: 1.8 10*3/uL (ref 0.7–4.0)
MCH: 32.2 pg (ref 26.0–34.0)
MCHC: 34.4 g/dL (ref 30.0–36.0)
MCV: 93.7 fL (ref 78.0–100.0)
Platelets: 126 10*3/uL — ABNORMAL LOW (ref 150–400)
RDW: 15.1 % (ref 11.5–15.5)

## 2013-11-02 LAB — LACTIC ACID, PLASMA: Lactic Acid, Venous: 2 mmol/L (ref 0.5–2.2)

## 2013-11-02 LAB — COMPREHENSIVE METABOLIC PANEL
ALT: 21 U/L (ref 0–53)
Albumin: 3.7 g/dL (ref 3.5–5.2)
Alkaline Phosphatase: 68 U/L (ref 39–117)
CO2: 23 mEq/L (ref 19–32)
Calcium: 9.2 mg/dL (ref 8.4–10.5)
GFR calc Af Amer: 30 mL/min — ABNORMAL LOW (ref >90)
Glucose, Bld: 111 mg/dL — ABNORMAL HIGH (ref 70–99)
Potassium: 4 mEq/L (ref 3.5–5.1)
Sodium: 141 mEq/L (ref 135–145)
Total Protein: 6.7 g/dL (ref 6.0–8.3)

## 2013-11-02 LAB — PROTIME-INR
INR: 3.06 — ABNORMAL HIGH (ref 0.00–1.49)
Prothrombin Time: 30.5 seconds — ABNORMAL HIGH (ref 11.6–15.2)

## 2013-11-02 MED ORDER — SODIUM CHLORIDE 0.9 % IV BOLUS (SEPSIS)
1000.0000 mL | Freq: Once | INTRAVENOUS | Status: AC
  Administered 2013-11-02: 1000 mL via INTRAVENOUS

## 2013-11-02 MED ORDER — SODIUM CHLORIDE 0.9 % IV BOLUS (SEPSIS)
500.0000 mL | Freq: Once | INTRAVENOUS | Status: AC
  Administered 2013-11-02: 500 mL via INTRAVENOUS

## 2013-11-02 NOTE — ED Notes (Signed)
Pt arrives via EMS due to becoming lethargic and diaphoretic as noted by family.  EMS arrived and pt was still lethargic and diaphoretic and obtunded.  After 5 min pt started coming around and is talkative and A&O x 4 upon arrival

## 2013-11-02 NOTE — ED Notes (Signed)
Patient transported to X-ray 

## 2013-11-02 NOTE — ED Provider Notes (Signed)
CSN: [id number]     Arrival date & time 11/02/13  2027 History   First MD Initiated Contact with Patient 11/02/13 2029     Chief Complaint  Patient presents with  . Hypotension   (Consider location/radiation/quality/duration/timing/severity/associated sxs/prior Treatment) HPI Here for presyncope, diaphoresis. Pt was at visitation for wife's recent death. Talking to friend and felt like he was going to pass out. Onset was sudden, just prior to arrival.  The pain is none. Pt without complaint at this time. Modifying factors: symptoms improved with sitting down in chair.  Associated symptoms: no chest pain or SOB.  Recent medical care: EMS placed PIV, gave some IVF. Pt reports similar episodes in the past that occurred at home for which he did not seek medical care.    Past Medical History  Diagnosis Date  . Hypertension   . Hyperlipemia   . Coronary artery disease   . TIA (transient ischemic attack)   . Hernia   . GERD (gastroesophageal reflux disease)   . Macular degeneration    Past Surgical History  Procedure Laterality Date  . Hernia repair    . Appendectomy     No family history on file. History  Substance Use Topics  . Smoking status: Former Smoker    Quit date: 12/30/1962  . Smokeless tobacco: Not on file  . Alcohol Use: No    Review of Systems Constitutional: Negative for fever.  Eyes: Negative for vision loss.  ENT: Negative for difficulty swallowing.  Cardiovascular: Negative for chest pain. Respiratory: Negative for respiratory distress.  Gastrointestinal:  Negative for vomiting.  Genitourinary: Negative for inability to void.  Musculoskeletal: Negative for gait problem.  Integumentary: Negative for rash.  Neurological: Negative for new focal weakness.     Allergies  Sulfa antibiotics  Home Medications   Current Outpatient Rx  Name  Route  Sig  Dispense  Refill  . acetaminophen (TYLENOL) 500 MG tablet   Oral   Take 500 mg by mouth daily as needed for  mild pain.         Marland Kitchen Alum Hydroxide-Mag Carbonate (GAVISCON PO)   Oral   Take 1 tablet by mouth daily as needed (for gas).         Marland Kitchen aspirin 81 MG tablet   Oral   Take 81 mg by mouth daily.         . beta carotene w/minerals (OCUVITE) tablet   Oral   Take 2 tablets by mouth 2 (two) times daily.         . Tamsulosin HCl (FLOMAX) 0.4 MG CAPS   Oral   Take 0.4 mg by mouth daily after breakfast.          . valsartan-hydrochlorothiazide (DIOVAN-HCT) 160-25 MG per tablet   Oral   Take 1 tablet by mouth daily.         Marland Kitchen warfarin (COUMADIN) 2.5 MG tablet   Oral   Take 2.5 mg by mouth daily. Takes 2.5 mg daily on Sun, Tues Takes 5.0mg  daily on Mon, Wed, Thurs, Fri, Sat          BP 129/56  Pulse 82  Temp(Src) 97.6 F (36.4 C) (Oral)  Resp 19  Ht 5\' 10"  (1.778 m)  Wt 240 lb (108.863 kg)  BMI 34.44 kg/m2  SpO2 99% Physical Exam Nursing note and vitals reviewed.  Constitutional: Pt is alert and appears stated age. Eyes: No injection, no scleral icterus. HENT: Atraumatic, airway open without erythema or exudate.  Respiratory:  No respiratory distress. Equal breathing bilaterally. Cardiovascular: Normal rate. Extremities warm and well perfused.  Abdomen: Soft, non-tender. MSK: Extremities are atraumatic without deformity. Skin: No rash, no wounds.   Neuro: No motor nor sensory deficit. GCS 15, normal coordination.      ED Course  Procedures (including critical care time) Labs Review Labs Reviewed  CBC WITH DIFFERENTIAL - Abnormal; Notable for the following:    RBC 3.97 (*)    Hemoglobin 12.8 (*)    HCT 37.2 (*)    Platelets 126 (*)    All other components within normal limits  COMPREHENSIVE METABOLIC PANEL - Abnormal; Notable for the following:    Glucose, Bld 111 (*)    BUN 41 (*)    Creatinine, Ser 2.17 (*)    GFR calc non Af Amer 26 (*)    GFR calc Af Amer 30 (*)    All other components within normal limits  PROTIME-INR - Abnormal; Notable for  the following:    Prothrombin Time 30.5 (*)    INR 3.06 (*)    All other components within normal limits  GLUCOSE, CAPILLARY - Abnormal; Notable for the following:    Glucose-Capillary 107 (*)    All other components within normal limits  TROPONIN I  LACTIC ACID, PLASMA  URINALYSIS, ROUTINE W REFLEX MICROSCOPIC   Imaging Review Dg Chest 2 View  11/02/2013   CLINICAL DATA:  79 year old male with altered mental status weakness and hypertension. Initial encounter.  EXAM: CHEST  2 VIEW  COMPARISON:  02/12/2013 and earlier.  FINDINGS: Stable tortuous thoracic aorta contour. Other mediastinal contours are within normal limits. Stable lung volumes. No pneumothorax, pulmonary edema, pleural effusion or acute pulmonary opacity. No acute osseous abnormality identified.  IMPRESSION: No acute cardiopulmonary abnormality.   Electronically Signed   By: Augusto Gamble M.D.   On: 11/02/2013 21:47    EKG Interpretation   None       MDM   1. Acute renal insufficiency   2. Dehydration    79 y.o. male w/ PMHx of HTN, HL, CAD, TIA presents w/ presyncope, hypotension. Arrived GCS 15, asking to go home. Initial BP here 87/52. No chest pain, no SOB. IVF ordered. Plan for w/u.   EKG without acute ischemic change. CXR without acute disease. INR slightly elevated at 3.06. Lactic acid wnl, Troponin neg. CMP with renal insufficiency. CBC without significant anemia.   On re-eval, blood pressure improved. Pt has been able to stand and ambulate here in ED. He is here with family requesting to be discharged so he can attend wife's funeral tomorrow. Family states pt didn't eat or drink much today, stood for 3 hours prior to event. Given circumstances, will proceed with discharge with plan for patient to present to ED tomorrow for repeat BMP. Counseling provided regarding diagnosis, treatment plan, follow up recommendations, and return precautions. Questions answered.      I independently viewed, interpreted, and used  in my medical decision making all ordered lab and imaging tests. Medical Decision Making discussed with ED attending Dr. Freida Busman.      Charm Barges, MD 11/03/13 [phone]

## 2013-11-02 NOTE — ED Provider Notes (Addendum)
I saw and evaluated the patient, reviewed the resident's note and I agree with the findings and plan.  EKG Interpretation   None     pt seen and examined. Patient relates that he needs to attend the funeral of his wife tomorrow. He was given a liter of IV fluids here and denies any dizziness with standing. He will return here tomorrow for a repeat bmet  Agree with residents interpretation of ecg which I reviewed myself    Toy Baker, MD 11/02/13 1610  Toy Baker, MD 12/13/13 [phone]

## 2013-11-03 ENCOUNTER — Emergency Department (HOSPITAL_COMMUNITY)
Admission: EM | Admit: 2013-11-03 | Discharge: 2013-11-03 | Disposition: A | Discharge status: Home / Self Care | Payer: Medicare Other | Attending: Emergency Medicine | Admitting: Emergency Medicine

## 2013-11-03 ENCOUNTER — Encounter (HOSPITAL_COMMUNITY): Payer: Self-pay | Admitting: Emergency Medicine

## 2013-11-03 DIAGNOSIS — Z8719 Personal history of other diseases of the digestive system: Secondary | ICD-10-CM | POA: Insufficient documentation

## 2013-11-03 DIAGNOSIS — I2510 Atherosclerotic heart disease of native coronary artery without angina pectoris: Secondary | ICD-10-CM | POA: Diagnosis not present

## 2013-11-03 DIAGNOSIS — N289 Disorder of kidney and ureter, unspecified: Secondary | ICD-10-CM

## 2013-11-03 DIAGNOSIS — Z8673 Personal history of transient ischemic attack (TIA), and cerebral infarction without residual deficits: Secondary | ICD-10-CM | POA: Insufficient documentation

## 2013-11-03 DIAGNOSIS — Z8669 Personal history of other diseases of the nervous system and sense organs: Secondary | ICD-10-CM | POA: Insufficient documentation

## 2013-11-03 DIAGNOSIS — Z79899 Other long term (current) drug therapy: Secondary | ICD-10-CM | POA: Diagnosis not present

## 2013-11-03 DIAGNOSIS — Z7901 Long term (current) use of anticoagulants: Secondary | ICD-10-CM | POA: Insufficient documentation

## 2013-11-03 DIAGNOSIS — Z87891 Personal history of nicotine dependence: Secondary | ICD-10-CM | POA: Insufficient documentation

## 2013-11-03 DIAGNOSIS — I10 Essential (primary) hypertension: Secondary | ICD-10-CM | POA: Insufficient documentation

## 2013-11-03 DIAGNOSIS — Z7982 Long term (current) use of aspirin: Secondary | ICD-10-CM | POA: Diagnosis not present

## 2013-11-03 DIAGNOSIS — Z862 Personal history of diseases of the blood and blood-forming organs and certain disorders involving the immune mechanism: Secondary | ICD-10-CM | POA: Diagnosis not present

## 2013-11-03 DIAGNOSIS — Z8639 Personal history of other endocrine, nutritional and metabolic disease: Secondary | ICD-10-CM | POA: Insufficient documentation

## 2013-11-03 LAB — COMPREHENSIVE METABOLIC PANEL
ALT: 31 U/L (ref 0–53)
AST: 35 U/L (ref 0–37)
Albumin: 3.4 g/dL — ABNORMAL LOW (ref 3.5–5.2)
Alkaline Phosphatase: 68 U/L (ref 39–117)
Creatinine, Ser: 1.93 mg/dL — ABNORMAL HIGH (ref 0.50–1.35)
Potassium: 4.1 mEq/L (ref 3.5–5.1)
Sodium: 135 mEq/L (ref 135–145)
Total Protein: 6.6 g/dL (ref 6.0–8.3)

## 2013-11-03 LAB — CBC WITH DIFFERENTIAL/PLATELET
Basophils Absolute: 0 10*3/uL (ref 0.0–0.1)
Basophils Relative: 0 % (ref 0–1)
Eosinophils Absolute: 0.1 10*3/uL (ref 0.0–0.7)
Eosinophils Relative: 1 % (ref 0–5)
Hemoglobin: 12.6 g/dL — ABNORMAL LOW (ref 13.0–17.0)
Lymphs Abs: 1 10*3/uL (ref 0.7–4.0)
MCH: 32.1 pg (ref 26.0–34.0)
MCHC: 34.2 g/dL (ref 30.0–36.0)
MCV: 93.9 fL (ref 78.0–100.0)
Monocytes Absolute: 0.5 10*3/uL (ref 0.1–1.0)
Neutrophils Relative %: 84 % — ABNORMAL HIGH (ref 43–77)
Platelets: 116 10*3/uL — ABNORMAL LOW (ref 150–400)
RBC: 3.92 MIL/uL — ABNORMAL LOW (ref 4.22–5.81)
RDW: 15.4 % (ref 11.5–15.5)

## 2013-11-03 NOTE — ED Notes (Signed)
Pt returns today for blook work today.  Yesterday patient had syncopal episode at funeral home, brought to the ED and was dx with acute renal insufficiency.  Patient had to leave last night so that he could go to a funeral this morning.  Pt does not have any new symptoms, denies cp, sob, n/v, dizziness, changes in vision.  VSS, a&Ox4

## 2013-11-03 NOTE — ED Notes (Signed)
The pt was sen here last pm and diagnosed with renal insufficiency and the edp wanted to admit him but his wife just died and her funeral was today and he would not stay last pm.  He was told to return today for blood work .  Alert no distress

## 2013-11-03 NOTE — ED Provider Notes (Signed)
CSN: [id number]     Arrival date & time 11/03/13  1615 History   First MD Initiated Contact with Patient 11/03/13 1817     Chief Complaint  Patient presents with  . needs blood work    (Consider location/radiation/quality/duration/timing/severity/associated sxs/prior Treatment) HPI Comments: Douglas Walker is a 79 y.o. male who is here to have repeat blood work to evaluate for renal insufficiency. He was in the emergency department last night after an episode of near syncope. He declined admission because he wanted to attend his wife's funeral today. She had had a prolonged illness and subsequently died 5 days ago. The patient denies recent illnesses. He reports that today, he is eating well, drinking fluids well, and able to ambulate without difficulty. He was able to attend his lisinopril earlier today. He has no additional complaints. There no other known modifying factors.   The history is provided by the patient.    Past Medical History  Diagnosis Date  . Hypertension   . Hyperlipemia   . Coronary artery disease   . TIA (transient ischemic attack)   . Hernia   . GERD (gastroesophageal reflux disease)   . Macular degeneration    Past Surgical History  Procedure Laterality Date  . Hernia repair    . Appendectomy    . Hernia repair     No family history on file. History  Substance Use Topics  . Smoking status: Former Smoker    Quit date: 12/30/1962  . Smokeless tobacco: Not on file  . Alcohol Use: No    Review of Systems  All other systems reviewed and are negative.    Allergies  Sulfa antibiotics  Home Medications   Current Outpatient Rx  Name  Route  Sig  Dispense  Refill  . acetaminophen (TYLENOL) 500 MG tablet   Oral   Take 500 mg by mouth daily as needed for mild pain.         Marland Kitchen Alum Hydroxide-Mag Carbonate (GAVISCON PO)   Oral   Take 1 tablet by mouth daily as needed (for gas).         Marland Kitchen aspirin 81 MG tablet   Oral   Take 81 mg by mouth  daily.         . beta carotene w/minerals (OCUVITE) tablet   Oral   Take 2 tablets by mouth 2 (two) times daily.         . Tamsulosin HCl (FLOMAX) 0.4 MG CAPS   Oral   Take 0.4 mg by mouth daily after breakfast.          . valsartan-hydrochlorothiazide (DIOVAN-HCT) 160-25 MG per tablet   Oral   Take 1 tablet by mouth daily.         Marland Kitchen warfarin (COUMADIN) 2.5 MG tablet   Oral   Take 2.5 mg by mouth daily. Takes 2.5 mg daily on Sun, Tues Takes 5.0mg  daily on Mon, Wed, Thurs, Fri, Sat          BP 115/58  Pulse 66  Temp(Src) 100.4 F (38 C) (Oral)  Resp 20  Wt 232 lb (105.235 kg)  SpO2 98% Physical Exam  Nursing note and vitals reviewed. Constitutional: He is oriented to person, place, and time. He appears well-developed.  Elderly, robust  HENT:  Head: Normocephalic and atraumatic.  Right Ear: External ear normal.  Left Ear: External ear normal.  Eyes: Conjunctivae and EOM are normal. Pupils are equal, round, and reactive to light.  Neck: Normal range  of motion and phonation normal. Neck supple.  Cardiovascular: Normal rate, regular rhythm, normal heart sounds and intact distal pulses.   Pulmonary/Chest: Effort normal and breath sounds normal. He exhibits no bony tenderness.  Abdominal: Soft. Normal appearance. There is no tenderness. There is no guarding.  Musculoskeletal: Normal range of motion.  Neurological: He is alert and oriented to person, place, and time. No cranial nerve deficit or sensory deficit. He exhibits normal muscle tone. Coordination normal.  Skin: Skin is warm, dry and intact.  Psychiatric: He has a normal mood and affect. His behavior is normal. Judgment and thought content normal.    ED Course  Procedures (including critical care time)   Findings discussed with patient and son, who is with him. All questions answered. They agree with discharge to followup with PCP.  Labs Review Labs Reviewed  CBC WITH DIFFERENTIAL - Abnormal; Notable  for the following:    RBC 3.92 (*)    Hemoglobin 12.6 (*)    HCT 36.8 (*)    Platelets 116 (*)    Neutrophils Relative % 84 (*)    Neutro Abs 8.2 (*)    Lymphocytes Relative 10 (*)    All other components within normal limits  COMPREHENSIVE METABOLIC PANEL - Abnormal; Notable for the following:    BUN 37 (*)    Creatinine, Ser 1.93 (*)    Albumin 3.4 (*)    GFR calc non Af Amer 30 (*)    GFR calc Af Amer 35 (*)    All other components within normal limits   Imaging Review Dg Chest 2 View  11/02/2013   CLINICAL DATA:  79 year old male with altered mental status weakness and hypertension. Initial encounter.  EXAM: CHEST  2 VIEW  COMPARISON:  02/12/2013 and earlier.  FINDINGS: Stable tortuous thoracic aorta contour. Other mediastinal contours are within normal limits. Stable lung volumes. No pneumothorax, pulmonary edema, pleural effusion or acute pulmonary opacity. No acute osseous abnormality identified.  IMPRESSION: No acute cardiopulmonary abnormality.   Electronically Signed   By: Augusto Gamble M.D.   On: 11/02/2013 21:47    EKG Interpretation   None       MDM   1. Renal insufficiency    Renal insufficiency, improving. Clinically, he is better. He can be managed at home with observation, and followup with his PCP for check up in one week.   Nursing Notes Reviewed/ Care Coordinated, and agree without changes. Applicable Imaging Reviewed.  Interpretation of Laboratory Data incorporated into ED treatment   Plan: Home Medications- usual; Home Treatments and Observation- rest and fluids; return here if the recommended treatment, does not improve the symptoms; Recommended follow up- PCP, in 5-7 days      Flint Melter, MD 11/03/13 2028

## 2013-11-03 NOTE — ED Notes (Signed)
Family at bedside. 

## 2013-11-04 NOTE — ED Provider Notes (Signed)
I saw and evaluated the patient, reviewed the resident's note and I agree with the findings and plan.  Tymber Stallings T Hosteen Kienast, MD 11/04/13 1922 

## 2013-11-11 DIAGNOSIS — G5790 Unspecified mononeuropathy of unspecified lower limb: Secondary | ICD-10-CM | POA: Diagnosis not present

## 2013-11-11 DIAGNOSIS — I10 Essential (primary) hypertension: Secondary | ICD-10-CM | POA: Diagnosis not present

## 2013-11-11 DIAGNOSIS — N183 Chronic kidney disease, stage 3 unspecified: Secondary | ICD-10-CM | POA: Diagnosis not present

## 2013-11-24 ENCOUNTER — Ambulatory Visit (INDEPENDENT_AMBULATORY_CARE_PROVIDER_SITE_OTHER): Payer: Medicare Other | Admitting: *Deleted

## 2013-11-24 DIAGNOSIS — I4891 Unspecified atrial fibrillation: Secondary | ICD-10-CM | POA: Diagnosis not present

## 2013-12-15 ENCOUNTER — Ambulatory Visit (INDEPENDENT_AMBULATORY_CARE_PROVIDER_SITE_OTHER): Payer: Medicare Other | Admitting: *Deleted

## 2013-12-15 DIAGNOSIS — I4891 Unspecified atrial fibrillation: Secondary | ICD-10-CM

## 2014-01-12 ENCOUNTER — Ambulatory Visit (INDEPENDENT_AMBULATORY_CARE_PROVIDER_SITE_OTHER): Payer: Medicare Other | Admitting: *Deleted

## 2014-01-12 DIAGNOSIS — I4891 Unspecified atrial fibrillation: Secondary | ICD-10-CM

## 2014-01-12 LAB — POCT INR: INR: 2.8

## 2014-01-31 ENCOUNTER — Other Ambulatory Visit: Payer: Self-pay | Admitting: *Deleted

## 2014-01-31 MED ORDER — WARFARIN SODIUM 2.5 MG PO TABS: ORAL_TABLET | ORAL | Status: DC

## 2014-02-02 DIAGNOSIS — D696 Thrombocytopenia, unspecified: Secondary | ICD-10-CM | POA: Diagnosis not present

## 2014-02-02 DIAGNOSIS — N183 Chronic kidney disease, stage 3 unspecified: Secondary | ICD-10-CM | POA: Diagnosis not present

## 2014-02-02 DIAGNOSIS — R5381 Other malaise: Secondary | ICD-10-CM | POA: Diagnosis not present

## 2014-02-02 DIAGNOSIS — G5790 Unspecified mononeuropathy of unspecified lower limb: Secondary | ICD-10-CM | POA: Diagnosis not present

## 2014-02-02 DIAGNOSIS — I10 Essential (primary) hypertension: Secondary | ICD-10-CM | POA: Diagnosis not present

## 2014-02-02 DIAGNOSIS — E782 Mixed hyperlipidemia: Secondary | ICD-10-CM | POA: Diagnosis not present

## 2014-02-02 DIAGNOSIS — N400 Enlarged prostate without lower urinary tract symptoms: Secondary | ICD-10-CM | POA: Diagnosis not present

## 2014-02-02 DIAGNOSIS — I4891 Unspecified atrial fibrillation: Secondary | ICD-10-CM | POA: Diagnosis not present

## 2014-02-02 DIAGNOSIS — R5383 Other fatigue: Secondary | ICD-10-CM | POA: Diagnosis not present

## 2014-02-09 ENCOUNTER — Ambulatory Visit (INDEPENDENT_AMBULATORY_CARE_PROVIDER_SITE_OTHER): Payer: Medicare Other | Admitting: *Deleted

## 2014-02-09 DIAGNOSIS — I4891 Unspecified atrial fibrillation: Secondary | ICD-10-CM | POA: Diagnosis not present

## 2014-02-09 DIAGNOSIS — Z5181 Encounter for therapeutic drug level monitoring: Secondary | ICD-10-CM | POA: Diagnosis not present

## 2014-02-09 LAB — POCT INR: INR: 2.6

## 2014-03-23 ENCOUNTER — Ambulatory Visit (INDEPENDENT_AMBULATORY_CARE_PROVIDER_SITE_OTHER): Payer: Medicare Other | Admitting: Pharmacist

## 2014-03-23 DIAGNOSIS — I4891 Unspecified atrial fibrillation: Secondary | ICD-10-CM

## 2014-03-23 DIAGNOSIS — Z5181 Encounter for therapeutic drug level monitoring: Secondary | ICD-10-CM | POA: Diagnosis not present

## 2014-03-23 LAB — POCT INR: INR: 1.8

## 2014-04-08 DIAGNOSIS — N401 Enlarged prostate with lower urinary tract symptoms: Secondary | ICD-10-CM | POA: Diagnosis not present

## 2014-04-08 DIAGNOSIS — N138 Other obstructive and reflux uropathy: Secondary | ICD-10-CM | POA: Diagnosis not present

## 2014-04-08 DIAGNOSIS — N390 Urinary tract infection, site not specified: Secondary | ICD-10-CM | POA: Diagnosis not present

## 2014-04-08 DIAGNOSIS — N139 Obstructive and reflux uropathy, unspecified: Secondary | ICD-10-CM | POA: Diagnosis not present

## 2014-04-08 DIAGNOSIS — Z8551 Personal history of malignant neoplasm of bladder: Secondary | ICD-10-CM | POA: Diagnosis not present

## 2014-04-20 ENCOUNTER — Ambulatory Visit (INDEPENDENT_AMBULATORY_CARE_PROVIDER_SITE_OTHER): Payer: Medicare Other | Admitting: Pharmacist

## 2014-04-20 DIAGNOSIS — Z5181 Encounter for therapeutic drug level monitoring: Secondary | ICD-10-CM

## 2014-04-20 DIAGNOSIS — I4891 Unspecified atrial fibrillation: Secondary | ICD-10-CM | POA: Diagnosis not present

## 2014-04-20 LAB — POCT INR: INR: 4.4

## 2014-04-28 ENCOUNTER — Ambulatory Visit (INDEPENDENT_AMBULATORY_CARE_PROVIDER_SITE_OTHER): Payer: Medicare Other | Admitting: *Deleted

## 2014-04-28 DIAGNOSIS — I4891 Unspecified atrial fibrillation: Secondary | ICD-10-CM | POA: Diagnosis not present

## 2014-04-28 DIAGNOSIS — Z5181 Encounter for therapeutic drug level monitoring: Secondary | ICD-10-CM

## 2014-04-28 LAB — POCT INR: INR: 2

## 2014-05-02 ENCOUNTER — Other Ambulatory Visit: Payer: Self-pay | Admitting: *Deleted

## 2014-05-02 MED ORDER — WARFARIN SODIUM 2.5 MG PO TABS: ORAL_TABLET | ORAL | Status: DC

## 2014-05-13 ENCOUNTER — Ambulatory Visit (INDEPENDENT_AMBULATORY_CARE_PROVIDER_SITE_OTHER): Payer: Medicare Other | Admitting: *Deleted

## 2014-05-13 DIAGNOSIS — I4891 Unspecified atrial fibrillation: Secondary | ICD-10-CM

## 2014-05-13 DIAGNOSIS — Z5181 Encounter for therapeutic drug level monitoring: Secondary | ICD-10-CM

## 2014-05-13 LAB — POCT INR: INR: 3.7

## 2014-05-27 ENCOUNTER — Ambulatory Visit (INDEPENDENT_AMBULATORY_CARE_PROVIDER_SITE_OTHER): Payer: Medicare Other | Admitting: Pharmacist

## 2014-05-27 DIAGNOSIS — I4891 Unspecified atrial fibrillation: Secondary | ICD-10-CM | POA: Diagnosis not present

## 2014-05-27 DIAGNOSIS — Z5181 Encounter for therapeutic drug level monitoring: Secondary | ICD-10-CM | POA: Diagnosis not present

## 2014-05-27 LAB — POCT INR: INR: 4.8

## 2014-06-03 ENCOUNTER — Ambulatory Visit (INDEPENDENT_AMBULATORY_CARE_PROVIDER_SITE_OTHER): Payer: Medicare Other | Admitting: *Deleted

## 2014-06-03 DIAGNOSIS — Z5181 Encounter for therapeutic drug level monitoring: Secondary | ICD-10-CM | POA: Diagnosis not present

## 2014-06-03 DIAGNOSIS — I4891 Unspecified atrial fibrillation: Secondary | ICD-10-CM

## 2014-06-03 LAB — POCT INR: INR: 1.9

## 2014-06-17 ENCOUNTER — Ambulatory Visit (INDEPENDENT_AMBULATORY_CARE_PROVIDER_SITE_OTHER): Payer: Medicare Other | Admitting: Pharmacist

## 2014-06-17 DIAGNOSIS — Z5181 Encounter for therapeutic drug level monitoring: Secondary | ICD-10-CM | POA: Diagnosis not present

## 2014-06-17 DIAGNOSIS — I4891 Unspecified atrial fibrillation: Secondary | ICD-10-CM

## 2014-06-17 LAB — POCT INR: INR: 2.6

## 2014-07-19 ENCOUNTER — Ambulatory Visit (INDEPENDENT_AMBULATORY_CARE_PROVIDER_SITE_OTHER): Payer: Medicare Other | Admitting: *Deleted

## 2014-07-19 DIAGNOSIS — Z5181 Encounter for therapeutic drug level monitoring: Secondary | ICD-10-CM | POA: Diagnosis not present

## 2014-07-19 DIAGNOSIS — I4891 Unspecified atrial fibrillation: Secondary | ICD-10-CM

## 2014-07-19 LAB — POCT INR: INR: 3.3

## 2014-08-02 ENCOUNTER — Ambulatory Visit (INDEPENDENT_AMBULATORY_CARE_PROVIDER_SITE_OTHER): Payer: Medicare Other | Admitting: Pharmacist

## 2014-08-02 DIAGNOSIS — I4891 Unspecified atrial fibrillation: Secondary | ICD-10-CM

## 2014-08-02 DIAGNOSIS — Z5181 Encounter for therapeutic drug level monitoring: Secondary | ICD-10-CM | POA: Diagnosis not present

## 2014-08-02 LAB — POCT INR: INR: 3.5

## 2014-08-15 DIAGNOSIS — Z0000 Encounter for general adult medical examination without abnormal findings: Secondary | ICD-10-CM | POA: Diagnosis not present

## 2014-08-15 DIAGNOSIS — R259 Unspecified abnormal involuntary movements: Secondary | ICD-10-CM | POA: Diagnosis not present

## 2014-08-15 DIAGNOSIS — R5381 Other malaise: Secondary | ICD-10-CM | POA: Diagnosis not present

## 2014-08-15 DIAGNOSIS — Z23 Encounter for immunization: Secondary | ICD-10-CM | POA: Diagnosis not present

## 2014-08-15 DIAGNOSIS — R5383 Other fatigue: Secondary | ICD-10-CM | POA: Diagnosis not present

## 2014-08-15 DIAGNOSIS — G5790 Unspecified mononeuropathy of unspecified lower limb: Secondary | ICD-10-CM | POA: Diagnosis not present

## 2014-08-15 DIAGNOSIS — F3289 Other specified depressive episodes: Secondary | ICD-10-CM | POA: Diagnosis not present

## 2014-08-15 DIAGNOSIS — E559 Vitamin D deficiency, unspecified: Secondary | ICD-10-CM | POA: Diagnosis not present

## 2014-08-15 DIAGNOSIS — Z1331 Encounter for screening for depression: Secondary | ICD-10-CM | POA: Diagnosis not present

## 2014-08-15 DIAGNOSIS — F329 Major depressive disorder, single episode, unspecified: Secondary | ICD-10-CM | POA: Diagnosis not present

## 2014-08-15 DIAGNOSIS — I672 Cerebral atherosclerosis: Secondary | ICD-10-CM | POA: Diagnosis not present

## 2014-08-16 ENCOUNTER — Ambulatory Visit (INDEPENDENT_AMBULATORY_CARE_PROVIDER_SITE_OTHER): Payer: Medicare Other | Admitting: Pharmacist Clinician (PhC)/ Clinical Pharmacy Specialist

## 2014-08-16 DIAGNOSIS — I4891 Unspecified atrial fibrillation: Secondary | ICD-10-CM | POA: Diagnosis not present

## 2014-08-16 DIAGNOSIS — Z5181 Encounter for therapeutic drug level monitoring: Secondary | ICD-10-CM | POA: Diagnosis not present

## 2014-08-16 LAB — POCT INR: INR: 2.7

## 2014-08-31 ENCOUNTER — Other Ambulatory Visit: Payer: Self-pay

## 2014-08-31 DIAGNOSIS — L570 Actinic keratosis: Secondary | ICD-10-CM | POA: Diagnosis not present

## 2014-08-31 DIAGNOSIS — Z85828 Personal history of other malignant neoplasm of skin: Secondary | ICD-10-CM | POA: Diagnosis not present

## 2014-08-31 DIAGNOSIS — D485 Neoplasm of uncertain behavior of skin: Secondary | ICD-10-CM | POA: Diagnosis not present

## 2014-09-04 ENCOUNTER — Encounter: Payer: Self-pay | Admitting: *Deleted

## 2014-09-13 ENCOUNTER — Encounter: Payer: Self-pay | Admitting: *Deleted

## 2014-09-13 ENCOUNTER — Encounter: Payer: Self-pay | Admitting: Interventional Cardiology

## 2014-09-13 ENCOUNTER — Encounter (INDEPENDENT_AMBULATORY_CARE_PROVIDER_SITE_OTHER): Payer: Medicare Other

## 2014-09-13 ENCOUNTER — Ambulatory Visit (INDEPENDENT_AMBULATORY_CARE_PROVIDER_SITE_OTHER): Payer: Medicare Other | Admitting: *Deleted

## 2014-09-13 ENCOUNTER — Ambulatory Visit (INDEPENDENT_AMBULATORY_CARE_PROVIDER_SITE_OTHER): Payer: Medicare Other | Admitting: Interventional Cardiology

## 2014-09-13 VITALS — BP 112/60 | HR 68 | Ht 70.0 in | Wt 239.0 lb

## 2014-09-13 DIAGNOSIS — I4891 Unspecified atrial fibrillation: Secondary | ICD-10-CM

## 2014-09-13 DIAGNOSIS — I480 Paroxysmal atrial fibrillation: Secondary | ICD-10-CM

## 2014-09-13 DIAGNOSIS — I495 Sick sinus syndrome: Secondary | ICD-10-CM

## 2014-09-13 DIAGNOSIS — Z5181 Encounter for therapeutic drug level monitoring: Secondary | ICD-10-CM | POA: Diagnosis not present

## 2014-09-13 DIAGNOSIS — Z7901 Long term (current) use of anticoagulants: Secondary | ICD-10-CM | POA: Diagnosis not present

## 2014-09-13 DIAGNOSIS — I441 Atrioventricular block, second degree: Secondary | ICD-10-CM

## 2014-09-13 DIAGNOSIS — I10 Essential (primary) hypertension: Secondary | ICD-10-CM | POA: Insufficient documentation

## 2014-09-13 HISTORY — DX: Sick sinus syndrome: I49.5

## 2014-09-13 LAB — POCT INR: INR: 1.7

## 2014-09-13 NOTE — Patient Instructions (Signed)
Your physician has recommended you make the following change in your medication:  1) STOP Aspirin Take all other medications as prescribed  Your physician has recommended that you wear a holter monitor. Holter monitors are medical devices that record the heart's electrical activity. Doctors most often use these monitors to diagnose arrhythmias. Arrhythmias are problems with the speed or rhythm of the heartbeat. The monitor is a small, portable device. You can wear one while you do your normal daily activities. This is usually used to diagnose what is causing palpitations/syncope (passing out).   Your physician wants you to follow-up in: 1 year You will receive a reminder letter in the mail two months in advance. If you don't receive a letter, please call our office to schedule the follow-up appointment.

## 2014-09-13 NOTE — Progress Notes (Signed)
Patient ID: Douglas Walker, male   DOB: [birth year], 80 y.o.   MRN: [id number] E-Cardio 24 hour holter monitor applied to patient.

## 2014-09-13 NOTE — Progress Notes (Signed)
Patient ID: TORRIN CRIHFIELD, male   DOB: 17-May-1925, 80 y.o.   MRN: [id number]    [address], [address] [place], [place]  [address] Phone: [phone] Fax:  [phone]  Date:  09/13/2014   ID:  [name], DOB June 14, 1925, MRN [id number]  PCP:  Marjorie Smolder, MD   ASSESSMENT:  1. Tachycardia/bradycardia syndrome with previous documentation of atrial fibrillation and also a history of AV block and bradycardia with 2:1 conduction on beta blocker therapy. 2. Paroxysmal atrial fibrillation, currently in sinus bradycardia 3. Sinus bradycardia with severe first degree AV block 4. Chronic anticoagulation for stroke prophylaxis, without complications 5. Recurring episodes of sudden weakness, and frequent 6. Chronic diastolic heart failure   PLAN:  1. 24 hour Holter monitor. This would help to exclude transient high grade AV block that might require pacing therapy to keep the patient safe. 2. Discontinue aspirin to decrease the risk of bleeding 3. Clinical followup in one year   SUBJECTIVE: SHAIL URBAS is a 80 y.o. male who has been previously diagnosed with atrial fibrillation. He is currently doing well. Since last seeing him his wife has died. He lives independently. He has exertional fatigue. He has not had syncope or near syncope. There is no orthopnea or dyspnea. No bleeding on Coumadin and he denies chest discomfort.   Wt Readings from Last 3 Encounters:  09/13/14 239 lb (108.41 kg)  11/03/13 232 lb (105.235 kg)  11/02/13 240 lb (108.863 kg)     Past Medical History  Diagnosis Date  . Hypertension   . Hyperlipemia   . Coronary artery disease   . TIA (transient ischemic attack)   . Hernia   . GERD (gastroesophageal reflux disease)   . Macular degeneration     Current Outpatient Prescriptions  Medication Sig Dispense Refill  . acetaminophen (TYLENOL) 500 MG tablet Take 500 mg by mouth daily as needed for mild pain.      . beta carotene  w/minerals (OCUVITE) tablet Take 2 tablets by mouth once.       . primidone (MYSOLINE) 50 MG tablet Take 50 mg by mouth once.       . Tamsulosin HCl (FLOMAX) 0.4 MG CAPS Take 0.4 mg by mouth daily after breakfast.       . valsartan-hydrochlorothiazide (DIOVAN-HCT) 160-25 MG per tablet Take 1 tablet by mouth daily.      Marland Kitchen warfarin (COUMADIN) 2.5 MG tablet 2 tablets on all days except 1 tablet on Tuesdays, Thursday, and Sundays or as directed by coumadin clinic      . Alum Hydroxide-Mag Carbonate (GAVISCON PO) Take 1 tablet by mouth daily as needed (for gas).       No current facility-administered medications for this visit.    Allergies:    Allergies  Allergen Reactions  . Sulfa Antibiotics Other (See Comments)    Weakness, dizziness    Social History:  The patient  reports that he quit smoking about 51 years ago. He does not have any smokeless tobacco history on file. He reports that he does not drink alcohol or use illicit drugs.   ROS:  Please see the history of present illness.   Denies blood in his urine or stool. No transient neurological complaints. No symptoms to suggest claudication.   All other systems reviewed and negative.   OBJECTIVE: VS:  BP 112/60  Pulse 68  Ht 5\' 10"  (1.778 m)  Wt 239 lb (108.41 kg)  BMI 34.29 kg/m2 Well nourished,  well developed, in no acute distress, elderly but healthy appearing HEENT: normal Neck: JVD flat. Carotid bruit absent  Cardiac:  normal S1, S2; RRR; no murmur. A loud S4 gallop is audible Lungs:  clear to auscultation bilaterally, no wheezing, rhonchi or rales Abd: soft, nontender, no hepatomegaly Ext: Edema absent. Pulses 2+ Skin: warm and dry Neuro:  CNs 2-12 intact, no focal abnormalities noted  EKG:  EKG demonstrates sinus rhythm at 60 beats per minute with first degree AV block and a PR interval of 360 ms       Signed, Illene Labrador III, MD 09/13/2014 10:24 AM

## 2014-09-27 ENCOUNTER — Ambulatory Visit (INDEPENDENT_AMBULATORY_CARE_PROVIDER_SITE_OTHER): Payer: Medicare Other | Admitting: Pharmacist

## 2014-09-27 DIAGNOSIS — I480 Paroxysmal atrial fibrillation: Secondary | ICD-10-CM

## 2014-09-27 DIAGNOSIS — Z5181 Encounter for therapeutic drug level monitoring: Secondary | ICD-10-CM

## 2014-09-27 DIAGNOSIS — I4891 Unspecified atrial fibrillation: Secondary | ICD-10-CM | POA: Diagnosis not present

## 2014-09-27 LAB — POCT INR: INR: 1.5

## 2014-09-28 ENCOUNTER — Other Ambulatory Visit: Payer: Self-pay | Admitting: Dermatology

## 2014-09-28 DIAGNOSIS — D485 Neoplasm of uncertain behavior of skin: Secondary | ICD-10-CM | POA: Diagnosis not present

## 2014-09-28 DIAGNOSIS — L570 Actinic keratosis: Secondary | ICD-10-CM | POA: Diagnosis not present

## 2014-09-28 DIAGNOSIS — Z85828 Personal history of other malignant neoplasm of skin: Secondary | ICD-10-CM | POA: Diagnosis not present

## 2014-09-28 DIAGNOSIS — L821 Other seborrheic keratosis: Secondary | ICD-10-CM | POA: Diagnosis not present

## 2014-09-28 DIAGNOSIS — C44621 Squamous cell carcinoma of skin of unspecified upper limb, including shoulder: Secondary | ICD-10-CM | POA: Diagnosis not present

## 2014-09-28 DIAGNOSIS — L739 Follicular disorder, unspecified: Secondary | ICD-10-CM | POA: Diagnosis not present

## 2014-10-05 ENCOUNTER — Telehealth: Payer: Self-pay

## 2014-10-05 NOTE — Telephone Encounter (Signed)
pt aware of holter monitor results -OK -No high risk findings pt verbalized understanding.

## 2014-10-11 ENCOUNTER — Ambulatory Visit (INDEPENDENT_AMBULATORY_CARE_PROVIDER_SITE_OTHER): Payer: Medicare Other

## 2014-10-11 DIAGNOSIS — Z5181 Encounter for therapeutic drug level monitoring: Secondary | ICD-10-CM

## 2014-10-11 DIAGNOSIS — I480 Paroxysmal atrial fibrillation: Secondary | ICD-10-CM

## 2014-10-11 DIAGNOSIS — I4891 Unspecified atrial fibrillation: Secondary | ICD-10-CM | POA: Diagnosis not present

## 2014-10-11 LAB — POCT INR: INR: 1.6

## 2014-10-25 ENCOUNTER — Ambulatory Visit (INDEPENDENT_AMBULATORY_CARE_PROVIDER_SITE_OTHER): Payer: Medicare Other | Admitting: *Deleted

## 2014-10-25 DIAGNOSIS — I480 Paroxysmal atrial fibrillation: Secondary | ICD-10-CM | POA: Diagnosis not present

## 2014-10-25 DIAGNOSIS — Z5181 Encounter for therapeutic drug level monitoring: Secondary | ICD-10-CM

## 2014-10-25 DIAGNOSIS — I4891 Unspecified atrial fibrillation: Secondary | ICD-10-CM | POA: Diagnosis not present

## 2014-10-25 LAB — POCT INR: INR: 2.2

## 2014-11-07 DIAGNOSIS — J209 Acute bronchitis, unspecified: Secondary | ICD-10-CM | POA: Diagnosis not present

## 2014-11-15 ENCOUNTER — Ambulatory Visit (INDEPENDENT_AMBULATORY_CARE_PROVIDER_SITE_OTHER): Payer: Medicare Other

## 2014-11-15 DIAGNOSIS — I480 Paroxysmal atrial fibrillation: Secondary | ICD-10-CM

## 2014-11-15 DIAGNOSIS — Z5181 Encounter for therapeutic drug level monitoring: Secondary | ICD-10-CM | POA: Diagnosis not present

## 2014-11-15 DIAGNOSIS — R51 Headache: Secondary | ICD-10-CM | POA: Diagnosis not present

## 2014-11-15 DIAGNOSIS — I4891 Unspecified atrial fibrillation: Secondary | ICD-10-CM | POA: Diagnosis not present

## 2014-11-15 LAB — POCT INR: INR: 3.1

## 2014-11-21 ENCOUNTER — Ambulatory Visit (INDEPENDENT_AMBULATORY_CARE_PROVIDER_SITE_OTHER): Payer: Medicare Other

## 2014-11-21 DIAGNOSIS — I480 Paroxysmal atrial fibrillation: Secondary | ICD-10-CM | POA: Diagnosis not present

## 2014-11-21 DIAGNOSIS — I4891 Unspecified atrial fibrillation: Secondary | ICD-10-CM | POA: Diagnosis not present

## 2014-11-21 DIAGNOSIS — Z5181 Encounter for therapeutic drug level monitoring: Secondary | ICD-10-CM | POA: Diagnosis not present

## 2014-11-21 LAB — POCT INR: INR: 2.5

## 2014-11-26 ENCOUNTER — Other Ambulatory Visit: Payer: Self-pay | Admitting: Interventional Cardiology

## 2014-11-28 ENCOUNTER — Ambulatory Visit (INDEPENDENT_AMBULATORY_CARE_PROVIDER_SITE_OTHER): Payer: Medicare Other | Admitting: Pharmacist

## 2014-11-28 DIAGNOSIS — I480 Paroxysmal atrial fibrillation: Secondary | ICD-10-CM | POA: Diagnosis not present

## 2014-11-28 DIAGNOSIS — Z5181 Encounter for therapeutic drug level monitoring: Secondary | ICD-10-CM

## 2014-11-28 DIAGNOSIS — I4891 Unspecified atrial fibrillation: Secondary | ICD-10-CM | POA: Diagnosis not present

## 2014-11-28 LAB — POCT INR: INR: 2.1

## 2014-12-12 ENCOUNTER — Ambulatory Visit (INDEPENDENT_AMBULATORY_CARE_PROVIDER_SITE_OTHER): Payer: Medicare Other | Admitting: *Deleted

## 2014-12-12 DIAGNOSIS — I4891 Unspecified atrial fibrillation: Secondary | ICD-10-CM | POA: Diagnosis not present

## 2014-12-12 DIAGNOSIS — I480 Paroxysmal atrial fibrillation: Secondary | ICD-10-CM | POA: Diagnosis not present

## 2014-12-12 DIAGNOSIS — Z5181 Encounter for therapeutic drug level monitoring: Secondary | ICD-10-CM

## 2014-12-12 LAB — POCT INR: INR: 5.5

## 2014-12-19 ENCOUNTER — Ambulatory Visit (INDEPENDENT_AMBULATORY_CARE_PROVIDER_SITE_OTHER): Payer: Medicare Other | Admitting: Surgery

## 2014-12-19 DIAGNOSIS — I480 Paroxysmal atrial fibrillation: Secondary | ICD-10-CM | POA: Diagnosis not present

## 2014-12-19 DIAGNOSIS — Z5181 Encounter for therapeutic drug level monitoring: Secondary | ICD-10-CM | POA: Diagnosis not present

## 2014-12-19 DIAGNOSIS — I4891 Unspecified atrial fibrillation: Secondary | ICD-10-CM

## 2014-12-19 LAB — POCT INR: INR: 1.2

## 2014-12-26 ENCOUNTER — Ambulatory Visit (INDEPENDENT_AMBULATORY_CARE_PROVIDER_SITE_OTHER): Payer: Medicare Other | Admitting: *Deleted

## 2014-12-26 DIAGNOSIS — I4891 Unspecified atrial fibrillation: Secondary | ICD-10-CM

## 2014-12-26 DIAGNOSIS — I480 Paroxysmal atrial fibrillation: Secondary | ICD-10-CM

## 2014-12-26 DIAGNOSIS — Z5181 Encounter for therapeutic drug level monitoring: Secondary | ICD-10-CM | POA: Diagnosis not present

## 2014-12-26 LAB — POCT INR: INR: 2

## 2015-01-09 ENCOUNTER — Ambulatory Visit (INDEPENDENT_AMBULATORY_CARE_PROVIDER_SITE_OTHER): Payer: Medicare Other | Admitting: Pharmacist

## 2015-01-09 DIAGNOSIS — I4891 Unspecified atrial fibrillation: Secondary | ICD-10-CM | POA: Diagnosis not present

## 2015-01-09 DIAGNOSIS — Z5181 Encounter for therapeutic drug level monitoring: Secondary | ICD-10-CM | POA: Diagnosis not present

## 2015-01-09 DIAGNOSIS — I480 Paroxysmal atrial fibrillation: Secondary | ICD-10-CM

## 2015-01-09 LAB — POCT INR: INR: 2

## 2015-01-30 ENCOUNTER — Ambulatory Visit (INDEPENDENT_AMBULATORY_CARE_PROVIDER_SITE_OTHER): Payer: Medicare Other | Admitting: Pharmacist

## 2015-01-30 DIAGNOSIS — I480 Paroxysmal atrial fibrillation: Secondary | ICD-10-CM

## 2015-01-30 DIAGNOSIS — I4891 Unspecified atrial fibrillation: Secondary | ICD-10-CM

## 2015-01-30 DIAGNOSIS — Z5181 Encounter for therapeutic drug level monitoring: Secondary | ICD-10-CM | POA: Diagnosis not present

## 2015-01-30 LAB — POCT INR: INR: 1.9

## 2015-02-20 ENCOUNTER — Ambulatory Visit (INDEPENDENT_AMBULATORY_CARE_PROVIDER_SITE_OTHER): Payer: Medicare Other

## 2015-02-20 DIAGNOSIS — I4891 Unspecified atrial fibrillation: Secondary | ICD-10-CM

## 2015-02-20 DIAGNOSIS — Z5181 Encounter for therapeutic drug level monitoring: Secondary | ICD-10-CM

## 2015-02-20 DIAGNOSIS — I480 Paroxysmal atrial fibrillation: Secondary | ICD-10-CM

## 2015-02-20 LAB — POCT INR: INR: 1.7

## 2015-03-06 ENCOUNTER — Ambulatory Visit (INDEPENDENT_AMBULATORY_CARE_PROVIDER_SITE_OTHER): Payer: Medicare Other | Admitting: Pharmacist

## 2015-03-06 DIAGNOSIS — I4891 Unspecified atrial fibrillation: Secondary | ICD-10-CM | POA: Diagnosis not present

## 2015-03-06 DIAGNOSIS — I480 Paroxysmal atrial fibrillation: Secondary | ICD-10-CM | POA: Diagnosis not present

## 2015-03-06 DIAGNOSIS — Z5181 Encounter for therapeutic drug level monitoring: Secondary | ICD-10-CM | POA: Diagnosis not present

## 2015-03-06 LAB — POCT INR: INR: 1.7

## 2015-03-10 DIAGNOSIS — G629 Polyneuropathy, unspecified: Secondary | ICD-10-CM | POA: Diagnosis not present

## 2015-03-20 ENCOUNTER — Ambulatory Visit (INDEPENDENT_AMBULATORY_CARE_PROVIDER_SITE_OTHER): Payer: Medicare Other

## 2015-03-20 DIAGNOSIS — Z5181 Encounter for therapeutic drug level monitoring: Secondary | ICD-10-CM

## 2015-03-20 DIAGNOSIS — I480 Paroxysmal atrial fibrillation: Secondary | ICD-10-CM

## 2015-03-20 DIAGNOSIS — I4891 Unspecified atrial fibrillation: Secondary | ICD-10-CM

## 2015-03-20 LAB — POCT INR: INR: 2.2

## 2015-04-10 ENCOUNTER — Ambulatory Visit (INDEPENDENT_AMBULATORY_CARE_PROVIDER_SITE_OTHER): Payer: Medicare Other | Admitting: *Deleted

## 2015-04-10 DIAGNOSIS — I4891 Unspecified atrial fibrillation: Secondary | ICD-10-CM

## 2015-04-10 DIAGNOSIS — I480 Paroxysmal atrial fibrillation: Secondary | ICD-10-CM | POA: Diagnosis not present

## 2015-04-10 DIAGNOSIS — Z5181 Encounter for therapeutic drug level monitoring: Secondary | ICD-10-CM

## 2015-04-10 LAB — POCT INR: INR: 1.8

## 2015-04-21 DIAGNOSIS — R251 Tremor, unspecified: Secondary | ICD-10-CM | POA: Diagnosis not present

## 2015-04-21 DIAGNOSIS — E782 Mixed hyperlipidemia: Secondary | ICD-10-CM | POA: Diagnosis not present

## 2015-04-21 DIAGNOSIS — Z79899 Other long term (current) drug therapy: Secondary | ICD-10-CM | POA: Diagnosis not present

## 2015-04-21 DIAGNOSIS — D696 Thrombocytopenia, unspecified: Secondary | ICD-10-CM | POA: Diagnosis not present

## 2015-04-21 DIAGNOSIS — R946 Abnormal results of thyroid function studies: Secondary | ICD-10-CM | POA: Diagnosis not present

## 2015-04-21 DIAGNOSIS — N183 Chronic kidney disease, stage 3 (moderate): Secondary | ICD-10-CM | POA: Diagnosis not present

## 2015-04-21 DIAGNOSIS — G629 Polyneuropathy, unspecified: Secondary | ICD-10-CM | POA: Diagnosis not present

## 2015-04-21 DIAGNOSIS — I10 Essential (primary) hypertension: Secondary | ICD-10-CM | POA: Diagnosis not present

## 2015-04-24 ENCOUNTER — Ambulatory Visit (INDEPENDENT_AMBULATORY_CARE_PROVIDER_SITE_OTHER): Payer: Medicare Other | Admitting: *Deleted

## 2015-04-24 DIAGNOSIS — Z5181 Encounter for therapeutic drug level monitoring: Secondary | ICD-10-CM | POA: Diagnosis not present

## 2015-04-24 DIAGNOSIS — I480 Paroxysmal atrial fibrillation: Secondary | ICD-10-CM | POA: Diagnosis not present

## 2015-04-24 DIAGNOSIS — I4891 Unspecified atrial fibrillation: Secondary | ICD-10-CM

## 2015-04-24 LAB — POCT INR: INR: 1.6

## 2015-05-08 ENCOUNTER — Ambulatory Visit (INDEPENDENT_AMBULATORY_CARE_PROVIDER_SITE_OTHER): Payer: Medicare Other | Admitting: *Deleted

## 2015-05-08 DIAGNOSIS — I4891 Unspecified atrial fibrillation: Secondary | ICD-10-CM

## 2015-05-08 DIAGNOSIS — I480 Paroxysmal atrial fibrillation: Secondary | ICD-10-CM | POA: Diagnosis not present

## 2015-05-08 DIAGNOSIS — Z5181 Encounter for therapeutic drug level monitoring: Secondary | ICD-10-CM

## 2015-05-08 LAB — POCT INR: INR: 1.2

## 2015-05-19 ENCOUNTER — Inpatient Hospital Stay (HOSPITAL_COMMUNITY): Payer: Medicare Other

## 2015-05-19 ENCOUNTER — Encounter (HOSPITAL_COMMUNITY): Payer: Self-pay | Admitting: *Deleted

## 2015-05-19 ENCOUNTER — Emergency Department (HOSPITAL_COMMUNITY): Payer: Medicare Other

## 2015-05-19 ENCOUNTER — Inpatient Hospital Stay (HOSPITAL_COMMUNITY)
Admission: EM | Admit: 2015-05-19 | Discharge: 2015-05-21 | DRG: 312 | Disposition: A | Discharge status: Home Health | Payer: Medicare Other | Attending: Internal Medicine | Admitting: Internal Medicine

## 2015-05-19 DIAGNOSIS — Z8673 Personal history of transient ischemic attack (TIA), and cerebral infarction without residual deficits: Secondary | ICD-10-CM | POA: Diagnosis not present

## 2015-05-19 DIAGNOSIS — I959 Hypotension, unspecified: Secondary | ICD-10-CM | POA: Diagnosis present

## 2015-05-19 DIAGNOSIS — I2510 Atherosclerotic heart disease of native coronary artery without angina pectoris: Secondary | ICD-10-CM | POA: Diagnosis present

## 2015-05-19 DIAGNOSIS — Z882 Allergy status to sulfonamides status: Secondary | ICD-10-CM | POA: Diagnosis not present

## 2015-05-19 DIAGNOSIS — Z7901 Long term (current) use of anticoagulants: Secondary | ICD-10-CM | POA: Diagnosis not present

## 2015-05-19 DIAGNOSIS — Z87891 Personal history of nicotine dependence: Secondary | ICD-10-CM

## 2015-05-19 DIAGNOSIS — N400 Enlarged prostate without lower urinary tract symptoms: Secondary | ICD-10-CM | POA: Diagnosis present

## 2015-05-19 DIAGNOSIS — R0902 Hypoxemia: Secondary | ICD-10-CM | POA: Diagnosis present

## 2015-05-19 DIAGNOSIS — I441 Atrioventricular block, second degree: Secondary | ICD-10-CM | POA: Diagnosis present

## 2015-05-19 DIAGNOSIS — T148 Other injury of unspecified body region: Secondary | ICD-10-CM | POA: Diagnosis not present

## 2015-05-19 DIAGNOSIS — Z66 Do not resuscitate: Secondary | ICD-10-CM | POA: Diagnosis present

## 2015-05-19 DIAGNOSIS — G629 Polyneuropathy, unspecified: Secondary | ICD-10-CM | POA: Diagnosis present

## 2015-05-19 DIAGNOSIS — E860 Dehydration: Secondary | ICD-10-CM | POA: Diagnosis not present

## 2015-05-19 DIAGNOSIS — K219 Gastro-esophageal reflux disease without esophagitis: Secondary | ICD-10-CM | POA: Diagnosis present

## 2015-05-19 DIAGNOSIS — W1830XA Fall on same level, unspecified, initial encounter: Secondary | ICD-10-CM | POA: Diagnosis present

## 2015-05-19 DIAGNOSIS — Z79899 Other long term (current) drug therapy: Secondary | ICD-10-CM

## 2015-05-19 DIAGNOSIS — I5032 Chronic diastolic (congestive) heart failure: Secondary | ICD-10-CM | POA: Diagnosis present

## 2015-05-19 DIAGNOSIS — Y92009 Unspecified place in unspecified non-institutional (private) residence as the place of occurrence of the external cause: Secondary | ICD-10-CM | POA: Diagnosis not present

## 2015-05-19 DIAGNOSIS — H3530 Unspecified macular degeneration: Secondary | ICD-10-CM | POA: Diagnosis present

## 2015-05-19 DIAGNOSIS — E785 Hyperlipidemia, unspecified: Secondary | ICD-10-CM | POA: Diagnosis present

## 2015-05-19 DIAGNOSIS — I480 Paroxysmal atrial fibrillation: Secondary | ICD-10-CM | POA: Diagnosis present

## 2015-05-19 DIAGNOSIS — B952 Enterococcus as the cause of diseases classified elsewhere: Secondary | ICD-10-CM | POA: Diagnosis present

## 2015-05-19 DIAGNOSIS — S2232XA Fracture of one rib, left side, initial encounter for closed fracture: Secondary | ICD-10-CM | POA: Diagnosis present

## 2015-05-19 DIAGNOSIS — I495 Sick sinus syndrome: Secondary | ICD-10-CM | POA: Diagnosis present

## 2015-05-19 DIAGNOSIS — S2232XB Fracture of one rib, left side, initial encounter for open fracture: Secondary | ICD-10-CM | POA: Diagnosis not present

## 2015-05-19 DIAGNOSIS — S3991XA Unspecified injury of abdomen, initial encounter: Secondary | ICD-10-CM | POA: Diagnosis not present

## 2015-05-19 DIAGNOSIS — W19XXXA Unspecified fall, initial encounter: Secondary | ICD-10-CM | POA: Insufficient documentation

## 2015-05-19 DIAGNOSIS — I10 Essential (primary) hypertension: Secondary | ICD-10-CM | POA: Diagnosis not present

## 2015-05-19 DIAGNOSIS — R0781 Pleurodynia: Secondary | ICD-10-CM | POA: Diagnosis not present

## 2015-05-19 DIAGNOSIS — S2242XA Multiple fractures of ribs, left side, initial encounter for closed fracture: Secondary | ICD-10-CM | POA: Diagnosis not present

## 2015-05-19 DIAGNOSIS — R079 Chest pain, unspecified: Secondary | ICD-10-CM | POA: Diagnosis not present

## 2015-05-19 DIAGNOSIS — R55 Syncope and collapse: Principal | ICD-10-CM | POA: Diagnosis present

## 2015-05-19 DIAGNOSIS — R51 Headache: Secondary | ICD-10-CM | POA: Diagnosis not present

## 2015-05-19 DIAGNOSIS — S0990XA Unspecified injury of head, initial encounter: Secondary | ICD-10-CM | POA: Diagnosis not present

## 2015-05-19 DIAGNOSIS — S199XXA Unspecified injury of neck, initial encounter: Secondary | ICD-10-CM | POA: Diagnosis not present

## 2015-05-19 DIAGNOSIS — M542 Cervicalgia: Secondary | ICD-10-CM | POA: Diagnosis not present

## 2015-05-19 DIAGNOSIS — R251 Tremor, unspecified: Secondary | ICD-10-CM | POA: Diagnosis present

## 2015-05-19 DIAGNOSIS — B9689 Other specified bacterial agents as the cause of diseases classified elsewhere: Secondary | ICD-10-CM | POA: Diagnosis not present

## 2015-05-19 DIAGNOSIS — N390 Urinary tract infection, site not specified: Secondary | ICD-10-CM | POA: Diagnosis present

## 2015-05-19 HISTORY — DX: Paroxysmal atrial fibrillation: I48.0

## 2015-05-19 HISTORY — DX: Sick sinus syndrome: I49.5

## 2015-05-19 LAB — URINALYSIS, ROUTINE W REFLEX MICROSCOPIC
Bilirubin Urine: NEGATIVE
Glucose, UA: NEGATIVE mg/dL
Ketones, ur: NEGATIVE mg/dL
NITRITE: NEGATIVE
PH: 7 (ref 5.0–8.0)
Protein, ur: NEGATIVE mg/dL
SPECIFIC GRAVITY, URINE: 1.012 (ref 1.005–1.030)
Urobilinogen, UA: 0.2 mg/dL (ref 0.0–1.0)

## 2015-05-19 LAB — ETHANOL

## 2015-05-19 LAB — CBC WITH DIFFERENTIAL/PLATELET
BASOS ABS: 0 10*3/uL (ref 0.0–0.1)
Basophils Relative: 0 % (ref 0–1)
Eosinophils Absolute: 0.1 10*3/uL (ref 0.0–0.7)
Eosinophils Relative: 2 % (ref 0–5)
HCT: 40.6 % (ref 39.0–52.0)
Hemoglobin: 13.5 g/dL (ref 13.0–17.0)
Lymphocytes Relative: 29 % (ref 12–46)
Lymphs Abs: 2.1 10*3/uL (ref 0.7–4.0)
MCH: 31.4 pg (ref 26.0–34.0)
MCHC: 33.3 g/dL (ref 30.0–36.0)
MCV: 94.4 fL (ref 78.0–100.0)
MONO ABS: 0.6 10*3/uL (ref 0.1–1.0)
Monocytes Relative: 8 % (ref 3–12)
NEUTROS ABS: 4.3 10*3/uL (ref 1.7–7.7)
NEUTROS PCT: 61 % (ref 43–77)
Platelets: 135 10*3/uL — ABNORMAL LOW (ref 150–400)
RBC: 4.3 MIL/uL (ref 4.22–5.81)
RDW: 14.1 % (ref 11.5–15.5)
WBC: 7.1 10*3/uL (ref 4.0–10.5)

## 2015-05-19 LAB — COMPREHENSIVE METABOLIC PANEL
ALK PHOS: 73 U/L (ref 38–126)
ALT: 16 U/L — ABNORMAL LOW (ref 17–63)
ANION GAP: 11 (ref 5–15)
AST: 18 U/L (ref 15–41)
Albumin: 3.3 g/dL — ABNORMAL LOW (ref 3.5–5.0)
BUN: 22 mg/dL — ABNORMAL HIGH (ref 6–20)
CO2: 26 mmol/L (ref 22–32)
Calcium: 9.1 mg/dL (ref 8.9–10.3)
Chloride: 99 mmol/L — ABNORMAL LOW (ref 101–111)
Creatinine, Ser: 1.54 mg/dL — ABNORMAL HIGH (ref 0.61–1.24)
GFR calc Af Amer: 45 mL/min — ABNORMAL LOW (ref >60)
GFR calc non Af Amer: 39 mL/min — ABNORMAL LOW (ref >60)
GLUCOSE: 119 mg/dL — AB (ref 65–99)
POTASSIUM: 3.6 mmol/L (ref 3.5–5.1)
SODIUM: 136 mmol/L (ref 135–145)
TOTAL PROTEIN: 6 g/dL — AB (ref 6.5–8.1)
Total Bilirubin: 0.6 mg/dL (ref 0.3–1.2)

## 2015-05-19 LAB — I-STAT CHEM 8, ED
BUN: 25 mg/dL — AB (ref 6–20)
CHLORIDE: 101 mmol/L (ref 101–111)
Calcium, Ion: 1.11 mmol/L — ABNORMAL LOW (ref 1.13–1.30)
Creatinine, Ser: 1.5 mg/dL — ABNORMAL HIGH (ref 0.61–1.24)
GLUCOSE: 123 mg/dL — AB (ref 65–99)
HCT: 43 % (ref 39.0–52.0)
Hemoglobin: 14.6 g/dL (ref 13.0–17.0)
Potassium: 3.6 mmol/L (ref 3.5–5.1)
Sodium: 138 mmol/L (ref 135–145)
TCO2: 24 mmol/L (ref 0–100)

## 2015-05-19 LAB — I-STAT TROPONIN, ED: Troponin i, poc: 0 ng/mL (ref 0.00–0.08)

## 2015-05-19 LAB — PROTIME-INR
INR: 1.99 — ABNORMAL HIGH (ref 0.00–1.49)
Prothrombin Time: 22.5 seconds — ABNORMAL HIGH (ref 11.6–15.2)

## 2015-05-19 LAB — CBG MONITORING, ED: Glucose-Capillary: 115 mg/dL — ABNORMAL HIGH (ref 65–99)

## 2015-05-19 LAB — URINE MICROSCOPIC-ADD ON

## 2015-05-19 LAB — SAMPLE TO BLOOD BANK

## 2015-05-19 LAB — I-STAT CG4 LACTIC ACID, ED: Lactic Acid, Venous: 1.64 mmol/L (ref 0.5–2.0)

## 2015-05-19 LAB — LIPASE, BLOOD: LIPASE: 48 U/L (ref 22–51)

## 2015-05-19 LAB — TSH: TSH: 4.839 u[IU]/mL — ABNORMAL HIGH (ref 0.350–4.500)

## 2015-05-19 MED ORDER — OCUVITE PO TABS: 2.0000 | ORAL_TABLET | Freq: Once | ORAL | Status: DC

## 2015-05-19 MED ORDER — WARFARIN - PHARMACIST DOSING INPATIENT
Freq: Every day | Status: DC
  Administered 2015-05-19: 18:00:00

## 2015-05-19 MED ORDER — DULOXETINE HCL 20 MG PO CPEP
20.0000 mg | ORAL_CAPSULE | Freq: Two times a day (BID) | ORAL | Status: DC
  Administered 2015-05-19 – 2015-05-21 (×5): 20 mg via ORAL
  Filled 2015-05-19 (×6): qty 1

## 2015-05-19 MED ORDER — LORAZEPAM 2 MG/ML IJ SOLN: 1.0000 mg | Freq: Once | INTRAMUSCULAR | Status: DC

## 2015-05-19 MED ORDER — DEXTROSE 5 % IV SOLN
1.0000 g | Freq: Once | INTRAVENOUS | Status: DC
  Administered 2015-05-19: 1 g via INTRAVENOUS
  Filled 2015-05-19: qty 10

## 2015-05-19 MED ORDER — ACETAMINOPHEN 325 MG PO TABS: 650.0000 mg | ORAL_TABLET | Freq: Four times a day (QID) | ORAL | Status: DC | PRN

## 2015-05-19 MED ORDER — MORPHINE SULFATE 4 MG/ML IJ SOLN
4.0000 mg | Freq: Once | INTRAMUSCULAR | Status: AC
  Administered 2015-05-19: 4 mg via INTRAVENOUS
  Filled 2015-05-19: qty 1

## 2015-05-19 MED ORDER — HYDROCODONE-ACETAMINOPHEN 5-325 MG PO TABS
1.0000 | ORAL_TABLET | ORAL | Status: DC | PRN
  Administered 2015-05-19 – 2015-05-21 (×7): 2 via ORAL
  Administered 2015-05-21: 1 via ORAL
  Filled 2015-05-19 (×8): qty 2

## 2015-05-19 MED ORDER — ONDANSETRON HCL 4 MG/2ML IJ SOLN: 4.0000 mg | Freq: Four times a day (QID) | INTRAMUSCULAR | Status: DC | PRN

## 2015-05-19 MED ORDER — SODIUM CHLORIDE 0.9 % IJ SOLN
3.0000 mL | Freq: Two times a day (BID) | INTRAMUSCULAR | Status: DC
  Administered 2015-05-19 – 2015-05-21 (×5): 3 mL via INTRAVENOUS

## 2015-05-19 MED ORDER — PROSIGHT PO TABS
2.0000 | ORAL_TABLET | Freq: Once | ORAL | Status: AC
  Administered 2015-05-19: 2 via ORAL
  Filled 2015-05-19: qty 2

## 2015-05-19 MED ORDER — ALUM & MAG HYDROXIDE-SIMETH 200-200-20 MG/5ML PO SUSP: 30.0000 mL | Freq: Four times a day (QID) | ORAL | Status: DC | PRN

## 2015-05-19 MED ORDER — PRIMIDONE 50 MG PO TABS
50.0000 mg | ORAL_TABLET | Freq: Three times a day (TID) | ORAL | Status: DC
  Administered 2015-05-19 – 2015-05-21 (×7): 50 mg via ORAL
  Filled 2015-05-19 (×9): qty 1

## 2015-05-19 MED ORDER — TAMSULOSIN HCL 0.4 MG PO CAPS
0.4000 mg | ORAL_CAPSULE | Freq: Every day | ORAL | Status: DC
  Administered 2015-05-20 – 2015-05-21 (×2): 0.4 mg via ORAL
  Filled 2015-05-19 (×3): qty 1

## 2015-05-19 MED ORDER — GABAPENTIN 100 MG PO CAPS
100.0000 mg | ORAL_CAPSULE | Freq: Three times a day (TID) | ORAL | Status: DC
  Administered 2015-05-19 – 2015-05-21 (×7): 100 mg via ORAL
  Filled 2015-05-19 (×9): qty 1

## 2015-05-19 MED ORDER — ONDANSETRON HCL 4 MG PO TABS: 4.0000 mg | ORAL_TABLET | Freq: Four times a day (QID) | ORAL | Status: DC | PRN

## 2015-05-19 MED ORDER — SODIUM CHLORIDE 0.9 % IV BOLUS (SEPSIS)
1000.0000 mL | Freq: Once | INTRAVENOUS | Status: AC
  Administered 2015-05-19: 1000 mL via INTRAVENOUS

## 2015-05-19 MED ORDER — DEXTROSE 5 % IV SOLN: 1.0000 g | INTRAVENOUS | Status: DC

## 2015-05-19 MED ORDER — SENNOSIDES-DOCUSATE SODIUM 8.6-50 MG PO TABS
1.0000 | ORAL_TABLET | Freq: Every evening | ORAL | Status: DC | PRN
  Filled 2015-05-19: qty 1

## 2015-05-19 MED ORDER — CEFTRIAXONE SODIUM IN DEXTROSE 20 MG/ML IV SOLN
1.0000 g | INTRAVENOUS | Status: DC
  Administered 2015-05-20 – 2015-05-21 (×2): 1 g via INTRAVENOUS
  Filled 2015-05-19 (×2): qty 50

## 2015-05-19 MED ORDER — DOCUSATE SODIUM 100 MG PO CAPS
100.0000 mg | ORAL_CAPSULE | Freq: Two times a day (BID) | ORAL | Status: DC
  Administered 2015-05-19 – 2015-05-21 (×5): 100 mg via ORAL
  Filled 2015-05-19 (×6): qty 1

## 2015-05-19 MED ORDER — LIDOCAINE 5 % EX PTCH
1.0000 | MEDICATED_PATCH | CUTANEOUS | Status: DC
  Administered 2015-05-19 – 2015-05-21 (×3): 1 via TRANSDERMAL
  Filled 2015-05-19 (×3): qty 1

## 2015-05-19 MED ORDER — ACETAMINOPHEN 650 MG RE SUPP: 650.0000 mg | Freq: Four times a day (QID) | RECTAL | Status: DC | PRN

## 2015-05-19 MED ORDER — WARFARIN SODIUM 5 MG PO TABS
5.0000 mg | ORAL_TABLET | Freq: Once | ORAL | Status: AC
  Administered 2015-05-19: 5 mg via ORAL
  Filled 2015-05-19: qty 1

## 2015-05-19 MED ORDER — IOHEXOL 300 MG/ML  SOLN
100.0000 mL | Freq: Once | INTRAMUSCULAR | Status: AC | PRN
  Administered 2015-05-19: 100 mL via INTRAVENOUS

## 2015-05-19 MED ORDER — SODIUM CHLORIDE 0.9 % IV SOLN
INTRAVENOUS | Status: AC
  Administered 2015-05-19 – 2015-05-20 (×2): via INTRAVENOUS

## 2015-05-19 MED ORDER — SODIUM CHLORIDE 0.9 % IV SOLN
INTRAVENOUS | Status: DC
  Administered 2015-05-19: 11:00:00 via INTRAVENOUS

## 2015-05-19 NOTE — Consult Note (Signed)
CARDIOLOGY CONSULT NOTE   Patient ID: Douglas Walker MRN: [id number], DOB/AGE: 1925-06-04   Admit date: 05/19/2015 Date of Consult: 05/19/2015   Primary Physician: Marjorie Smolder, MD Primary Cardiologist: Dr. Tamala Julian  Pt. Profile   81 year old Caucasian male with PMH of tachybradycardia with intermittent 2nd degree Mobitz II AV block on BB, history of atrial fibrillation on Coumadin, chronic diastolic heart failure on HCTZ, history of TIA, hypertension and hyperlipidemia present with syncope during urination  Problem List  Past Medical History  Diagnosis Date  . Hypertension   . Hyperlipemia   . Coronary artery disease   . TIA (transient ischemic attack)   . Hernia   . GERD (gastroesophageal reflux disease)   . Macular degeneration   . Paroxysmal atrial fibrillation 10/28/2013  . Tachycardia-bradycardia syndrome 09/13/2014    Past Surgical History  Procedure Laterality Date  . Hernia repair    . Appendectomy    . Hernia repair       Allergies  Allergies  Allergen Reactions  . Sulfa Antibiotics Other (See Comments)    Weakness, dizziness    HPI   The patient is a 81 year old Caucasian male with PMH of tachybradycardia with intermittent 2nd degree Mobitz II AV block on BB, history of atrial fibrillation on Coumadin, chronic diastolic heart failure on HCTZ, history of TIA, hypertension and hyperlipidemia. Although patient carries a diagnosis of CAD, however according to himself, he was noted to have chronic bradycardia in the 1980s, due to abnormal workup, he underwent cardiac catheterization at that time which showed a normal coronary. He has been followed by Dr. Tamala Julian and his last office visit was in September 2015, at which time he was doing well. He has chronic recurring weakness.  He was taken off of beta blocker due to presence of AV block. In order to further assess the degree of AV block, he was sent home to do a 24-hour Holter monitor. Holter monitor captured  40 PVCs, 2 couplets, single run of 6 beats of nonsustained VT. The longest captured pause was 2.1 second. No further invasive workup or PPM was felt to be necessary in this case. since then, he has been doing well at home. He has been living independently at home since his wife passed away. However for the past several weeks, he has been living with his son as he has bed bugs infestation in his own house. He denies any recent fever, chill, cough, lower extremity edema, orthopnea or paroxysmal nocturnal dyspnea. He states he always rest on the side of the bed for 5 minutes before gets up every morning.  He woke up around 5 AM in the morning of 05/19/2015 to go to the bathroom. He was standing in the bathroom urinating when he subsequently passed out. He does not remember any chest pain, or shortness breath prior to the event. He does not recall any sign of dizziness nor remember falling down. He states he woke up on the floor in a pool blood as he had hit his lips going down. He also urinated on the floor as well. He was subsequently sent to [facility] ED for further evaluation and was admitted to internal medicine service. Cardiology has been consulted for evaluation of syncope.  Inpatient Medications  . [START ON 05/20/2015] cefTRIAXone (ROCEPHIN)  IV  1 g Intravenous Q24H  . docusate sodium  100 mg Oral BID  . DULoxetine  20 mg Oral BID  . gabapentin  100 mg Oral TID  . lidocaine  1 patch Transdermal Q24H  . LORazepam  1 mg Intravenous Once  . primidone  50 mg Oral TID  . sodium chloride  3 mL Intravenous Q12H  . [START ON 05/20/2015] tamsulosin  0.4 mg Oral QPC breakfast  . warfarin  5 mg Oral ONCE-1800  . Warfarin - Pharmacist Dosing Inpatient   Does not apply q1800    Family History Family History  Problem Relation Age of Onset  . Family history unknown: Yes     Social History History   Social History  . Marital Status: Married    Spouse Name: N/A  . Number of Children: N/A  . Years  of Education: N/A   Occupational History  . Not on file.   Social History Main Topics  . Smoking status: Former Smoker    Quit date: 12/30/1962  . Smokeless tobacco: Never Used  . Alcohol Use: No  . Drug Use: No  . Sexual Activity: No   Other Topics Concern  . Not on file   Social History Narrative     Review of Systems  General:  No chills, fever, night sweats or weight changes.  Cardiovascular:  No dyspnea on exertion, edema, orthopnea, palpitations, paroxysmal nocturnal dyspnea. L sided rib pain after fall Dermatological: No rash, lesions/masses Respiratory: No cough, dyspnea Urologic: No hematuria, dysuria Abdominal:   No nausea, vomiting, diarrhea, bright red blood per rectum, melena, or hematemesis Neurologic:  No visual changes, changes in mental status. +syncope All other systems reviewed and are otherwise negative except as noted above.  Physical Exam  Blood pressure 129/55, pulse 58, temperature 98.3 F (36.8 C), temperature source Oral, resp. rate 18, height 5\' 9"  (1.753 m), weight 244 lb 4.3 oz (110.8 kg), SpO2 98 %.  General: Pleasant, NAD Psych: Normal affect. Neuro: Alert and oriented X 3. Moves all extremities spontaneously. HEENT: Normal  Neck: Supple without bruits or JVD. Lungs:  Resp regular and unlabored, CTA. Heart: RRR no s3, s4, or murmurs. Abdomen: Soft, non-tender, non-distended, BS + x 4.  Extremities: No clubbing, cyanosis or edema. DP/PT/Radials 2+ and equal bilaterally.  Labs  No results for input(s): CKTOTAL, CKMB, TROPONINI in the last 72 hours. Lab Results  Component Value Date   WBC 7.1 05/19/2015   HGB 14.6 05/19/2015   HCT 43.0 05/19/2015   MCV 94.4 05/19/2015   PLT 135* 05/19/2015     Recent Labs Lab 05/19/15 0630 05/19/15 0638  NA 136 138  K 3.6 3.6  CL 99* 101  CO2 26  --   BUN 22* 25*  CREATININE 1.54* 1.50*  CALCIUM 9.1  --   PROT 6.0*  --   BILITOT 0.6  --   ALKPHOS 73  --   ALT 16*  --   AST 18  --     GLUCOSE 119* 123*   No results found for: CHOL, HDL, LDLCALC, TRIG No results found for: DDIMER  Radiology/Studies  Ct Head Wo Contrast  05/19/2015   CLINICAL DATA:  [place] this morning while using the bathroom, posterior neck and head pain, initial encounter, history hypertension, hyperlipidemia, coronary artery disease, TIA, former smoker  EXAM: CT HEAD WITHOUT CONTRAST  CT CERVICAL SPINE WITHOUT CONTRAST  TECHNIQUE: Multidetector CT imaging of the head and cervical spine was performed following the standard protocol without intravenous contrast. Multiplanar CT image reconstructions of the cervical spine were also generated.  COMPARISON:  None  FINDINGS: CT HEAD FINDINGS  Generalized atrophy.  Normal ventricular morphology.  No midline shift  or mass effect.  Small vessel chronic ischemic changes of deep cerebral white matter.  Tiny old lacunar infarct at LEFT basal ganglia and questionably RIGHT thalamus and LEFT pons.  No intracranial hemorrhage, mass lesion, or acute infarction.  No extra-axial fluid collections.  Visualized paranasal sinuses and mastoid air cells clear.  Bones unremarkable.  Atherosclerotic calcifications at the carotid siphons bilaterally.  CT CERVICAL SPINE FINDINGS  Prevertebral soft tissues normal thickness.  Scattered mild facet degenerative changes.  Minimal disc space narrowing and endplate spur formation C4-C5, C5-C6, C6-C7.  Vertebral body heights maintained without fracture or subluxation.  No bone destruction.  Visualized skullbase intact.  Visualized lung apices clear.  IMPRESSION: Atrophy with small vessel chronic ischemic changes of deep cerebral white matter.  Tiny old lacunar infarcts questioned.  No acute intracranial abnormalities.  Multilevel degenerative disc and facet disease changes cervical spine.  No acute cervical spine abnormalities.   Electronically Signed   By: Lavonia Dana M.D.   On: 05/19/2015 08:34   Ct Chest W Contrast  05/19/2015   CLINICAL DATA:  Fall  this morning, neck pain, left rib pain  EXAM: CT CHEST, ABDOMEN, AND PELVIS WITH CONTRAST  TECHNIQUE: Multidetector CT imaging of the chest, abdomen and pelvis was performed following the standard protocol during bolus administration of intravenous contrast.  CONTRAST:  173mL OMNIPAQUE IOHEXOL 300 MG/ML  SOLN  COMPARISON:  None.  FINDINGS: CT CHEST FINDINGS  Sagittal images of the spine shows degenerative changes thoracic spine. Sagittal view of the sternum is unremarkable. No scapular fracture is noted.  Central airways are patent.  Mild atherosclerotic calcifications of thoracic aorta. No mediastinal hematoma or adenopathy. Atherosclerotic calcifications of coronary arteries. Heart size within normal limits. Small hiatal hernia.  No displaced rib fractures are identified. Mild atelectasis noted bilateral lung bases posteriorly.  Axial image 49 there is probable subtle nondisplaced fracture in left lower anterior ribs/left base anteriorly. Probable left sixth rib. Mild adjacent pleural thickening.  Images of the lung parenchyma shows no lung contusion. There is no pneumothorax. There is mild displaced fracture of the left lateral ninth rib see axial image 65.  CT ABDOMEN AND PELVIS FINDINGS  Sagittal images of the spine shows degenerative changes lumbar spine. No acute fractures are noted within lumbar spine. Mild degenerative changes bilateral SI joints. No pelvic fractures are noted.  Enhanced liver shows no laceration. Probable cyst in right hepatic lobe laterally measures 1 cm. The pancreas, spleen and adrenal glands are unremarkable. Kidneys are symmetrical in size and enhancement. A cyst in lower pole of the right kidney measures 1 cm. Cyst in midpole of the left kidney posterior aspect measures 2.5 cm. Atherosclerotic calcifications of abdominal aorta and iliac arteries.  No hydronephrosis or hydroureter. Delayed renal images shows bilateral renal symmetrical excretion. No evidence of renal laceration. No  calcified gallstones are noted within gallbladder.  No small bowel obstruction.  No ascites or free air.  No adenopathy.  Sigmoid colon diverticula are noted. No evidence of acute diverticulitis. There is mild thickening of wall of under distended urinary bladder. Mild cystitis cannot be excluded. Prostate gland calcifications are noted. The prostate gland is not enlarged. Seminal vesicles are unremarkable. Mild degenerative changes pubic symphysis.  Coronal images shows mild degenerative changes bilateral hip joints.  No definite sacral fracture is noted.  No aortic aneurysm.  IMPRESSION: 1. There is subtle nondisplaced fracture of the left anterior sixth rib. Mild displaced fracture of the left ninth rib. 2. No mediastinal hematoma or  adenopathy. 3. No lung contusion or pneumothorax. 4. Small hiatal hernia. 5. No acute visceral injury within abdomen or pelvis. 6. Degenerative changes lumbar spine. 7. Degenerative changes bilateral SI joints and hip joints. Mild degenerative changes pubic symphysis. 8. No pelvic fractures. 9. Sigmoid colon diverticula are noted. No evidence of acute diverticulitis. 10. No small bowel obstruction. 11. Mild thickening of wall of under distended urinary bladder. Mild cystitis cannot be excluded.   Electronically Signed   By: Lahoma Crocker M.D.   On: 05/19/2015 08:41   Ct Cervical Spine Wo Contrast  05/19/2015   CLINICAL DATA:  [place] this morning while using the bathroom, posterior neck and head pain, initial encounter, history hypertension, hyperlipidemia, coronary artery disease, TIA, former smoker  EXAM: CT HEAD WITHOUT CONTRAST  CT CERVICAL SPINE WITHOUT CONTRAST  TECHNIQUE: Multidetector CT imaging of the head and cervical spine was performed following the standard protocol without intravenous contrast. Multiplanar CT image reconstructions of the cervical spine were also generated.  COMPARISON:  None  FINDINGS: CT HEAD FINDINGS  Generalized atrophy.  Normal ventricular morphology.   No midline shift or mass effect.  Small vessel chronic ischemic changes of deep cerebral white matter.  Tiny old lacunar infarct at LEFT basal ganglia and questionably RIGHT thalamus and LEFT pons.  No intracranial hemorrhage, mass lesion, or acute infarction.  No extra-axial fluid collections.  Visualized paranasal sinuses and mastoid air cells clear.  Bones unremarkable.  Atherosclerotic calcifications at the carotid siphons bilaterally.  CT CERVICAL SPINE FINDINGS  Prevertebral soft tissues normal thickness.  Scattered mild facet degenerative changes.  Minimal disc space narrowing and endplate spur formation C4-C5, C5-C6, C6-C7.  Vertebral body heights maintained without fracture or subluxation.  No bone destruction.  Visualized skullbase intact.  Visualized lung apices clear.  IMPRESSION: Atrophy with small vessel chronic ischemic changes of deep cerebral white matter.  Tiny old lacunar infarcts questioned.  No acute intracranial abnormalities.  Multilevel degenerative disc and facet disease changes cervical spine.  No acute cervical spine abnormalities.   Electronically Signed   By: Lavonia Dana M.D.   On: 05/19/2015 08:34   Mr Brain Wo Contrast  05/19/2015   CLINICAL DATA:  Syncope.  EXAM: MRI HEAD WITHOUT CONTRAST  TECHNIQUE: Multiplanar, multiecho pulse sequences of the brain and surrounding structures were obtained without intravenous contrast.  COMPARISON:  Head CT 05/19/2015 and MRI 06/25/2006  FINDINGS: There is no evidence of acute infarct, intracranial hemorrhage, mass, midline shift, or extra-axial fluid collection. Punctate focus of susceptibility artifact at the inferior right cerebellum corresponds to calcification on CT. There is mild generalized cerebral atrophy. Patchy T2 hyperintensities throughout the subcortical and deep cerebral white matter have mildly progressed from the prior MRI and are nonspecific but compatible with moderate chronic small vessel ischemic disease.  Prior bilateral  cataract extraction is noted. Small maxillary sinus mucous retention cysts are present. Mastoid air cells are clear. Major intracranial vascular flow voids are preserved.  IMPRESSION: 1. No acute intracranial abnormality. 2. Moderate chronic small vessel ischemic disease.   Electronically Signed   By: Logan Bores   On: 05/19/2015 14:03   Ct Abdomen Pelvis W Contrast  05/19/2015   CLINICAL DATA:  Fall this morning, neck pain, left rib pain  EXAM: CT CHEST, ABDOMEN, AND PELVIS WITH CONTRAST  TECHNIQUE: Multidetector CT imaging of the chest, abdomen and pelvis was performed following the standard protocol during bolus administration of intravenous contrast.  CONTRAST:  12mL OMNIPAQUE IOHEXOL 300 MG/ML  SOLN  COMPARISON:  None.  FINDINGS: CT CHEST FINDINGS  Sagittal images of the spine shows degenerative changes thoracic spine. Sagittal view of the sternum is unremarkable. No scapular fracture is noted.  Central airways are patent.  Mild atherosclerotic calcifications of thoracic aorta. No mediastinal hematoma or adenopathy. Atherosclerotic calcifications of coronary arteries. Heart size within normal limits. Small hiatal hernia.  No displaced rib fractures are identified. Mild atelectasis noted bilateral lung bases posteriorly.  Axial image 49 there is probable subtle nondisplaced fracture in left lower anterior ribs/left base anteriorly. Probable left sixth rib. Mild adjacent pleural thickening.  Images of the lung parenchyma shows no lung contusion. There is no pneumothorax. There is mild displaced fracture of the left lateral ninth rib see axial image 65.  CT ABDOMEN AND PELVIS FINDINGS  Sagittal images of the spine shows degenerative changes lumbar spine. No acute fractures are noted within lumbar spine. Mild degenerative changes bilateral SI joints. No pelvic fractures are noted.  Enhanced liver shows no laceration. Probable cyst in right hepatic lobe laterally measures 1 cm. The pancreas, spleen and adrenal  glands are unremarkable. Kidneys are symmetrical in size and enhancement. A cyst in lower pole of the right kidney measures 1 cm. Cyst in midpole of the left kidney posterior aspect measures 2.5 cm. Atherosclerotic calcifications of abdominal aorta and iliac arteries.  No hydronephrosis or hydroureter. Delayed renal images shows bilateral renal symmetrical excretion. No evidence of renal laceration. No calcified gallstones are noted within gallbladder.  No small bowel obstruction.  No ascites or free air.  No adenopathy.  Sigmoid colon diverticula are noted. No evidence of acute diverticulitis. There is mild thickening of wall of under distended urinary bladder. Mild cystitis cannot be excluded. Prostate gland calcifications are noted. The prostate gland is not enlarged. Seminal vesicles are unremarkable. Mild degenerative changes pubic symphysis.  Coronal images shows mild degenerative changes bilateral hip joints.  No definite sacral fracture is noted.  No aortic aneurysm.  IMPRESSION: 1. There is subtle nondisplaced fracture of the left anterior sixth rib. Mild displaced fracture of the left ninth rib. 2. No mediastinal hematoma or adenopathy. 3. No lung contusion or pneumothorax. 4. Small hiatal hernia. 5. No acute visceral injury within abdomen or pelvis. 6. Degenerative changes lumbar spine. 7. Degenerative changes bilateral SI joints and hip joints. Mild degenerative changes pubic symphysis. 8. No pelvic fractures. 9. Sigmoid colon diverticula are noted. No evidence of acute diverticulitis. 10. No small bowel obstruction. 11. Mild thickening of wall of under distended urinary bladder. Mild cystitis cannot be excluded.   Electronically Signed   By: Lahoma Crocker M.D.   On: 05/19/2015 08:41   Dg Chest Portable 1 View  05/19/2015   CLINICAL DATA:  Recent fall with left-sided chest pain  EXAM: PORTABLE CHEST - 1 VIEW  COMPARISON:  11/02/2013  FINDINGS: Cardiac shadow is stable. Thoracic aorta is again tortuous  with mild calcifications. The lungs are well aerated with very minimal left basilar atelectasis. No bony abnormality is seen. No pneumothorax is noted.  IMPRESSION: Mild left basilar atelectasis.   Electronically Signed   By: Inez Catalina M.D.   On: 05/19/2015 07:19    ECG  NSR with prolonged 1st degree AV block  ASSESSMENT AND PLAN  1. Syncope  - Unclear cause, micturation vasovagal vs high grade AV block with prolonged pause.  - Echo 05/19/2015 EF 55-60%, no regional wall motion abnormality, mild AR.  - check orthostatic vital signs, observe overnight. Will do 30 day outpatient  event monitor on discharge unless high risk AV disease noted overnight to warrant PPM 2. L sided rib fracture 3. tachybradycardia with intermittent 2nd degree Mobitz II AV block on BB 4. history of atrial fibrillation on Coumadin 5. chronic diastolic heart failure on HCTZ 6. history of TIA 7. Hypertension 8. hyperlipidemia    Signed, Almyra Deforest, PA-C 05/19/2015, 3:02 PM   Patient seen and examined with Almyra Deforest, PA-C. We discussed all aspects of the encounter. I agree with the assessment and plan as stated above.  81 y/o male with h/o PAF and known AVN disease on b-blocker. Admitted with syncope after urinating. Clinically event sounds like micturition syncope. However, ECG reveals significant AV nodal disease with marked 1st AVB (~455ms) and normal infra-hisian system (QRS 89ms). Has not had any high degree block on tele.   I have discussed with Dr. Rayann Heman. Will ambulate and see if he progresses to higher-degree heart block. If not, will send home with 30-day event monitor and very close f/u with EP Clinic. Suspect he will need PPM in near future. He does not drive due to macular degeneration and I have cautioned him to sit when going to bathroom and use a walker as needed. Avoid all AV nodal blocking agents.   Bensimhon, Daniel,MD 3:39 PM

## 2015-05-19 NOTE — ED Provider Notes (Signed)
CSN: [id number]     Arrival date & time 05/19/15  8416 History   First MD Initiated Contact with Patient 05/19/15 [id number]     Chief Complaint  Patient presents with  . Fall     (Consider location/radiation/quality/duration/timing/severity/associated sxs/prior Treatment) Patient is a 81 y.o. male presenting with fall. The history is provided by the patient.  Fall This is a new problem. Associated symptoms include chest pain. Pertinent negatives include no abdominal pain, no headaches and no shortness of breath.   patient presents after syncope. Patient got up to urinate and woke up on the floor in the bathroom. He does not know what happened. He does not remember getting lightheaded. Complaining of pain in his neck and left chest. States that he thinks he injured his ribs. No previous episode of syncope. He's been ambulatory since the event. No difficulty breathing. He is on Coumadin. He also bit his cheek on the left side.  Past Medical History  Diagnosis Date  . Hypertension   . Hyperlipemia   . Coronary artery disease   . TIA (transient ischemic attack)   . Hernia   . GERD (gastroesophageal reflux disease)   . Macular degeneration    Past Surgical History  Procedure Laterality Date  . Hernia repair    . Appendectomy    . Hernia repair     No family history on file. History  Substance Use Topics  . Smoking status: Former Smoker    Quit date: 12/30/1962  . Smokeless tobacco: Never Used  . Alcohol Use: No    Review of Systems  Constitutional: Negative for activity change and appetite change.  Eyes: Negative for pain.  Respiratory: Negative for chest tightness and shortness of breath.   Cardiovascular: Positive for chest pain. Negative for leg swelling.  Gastrointestinal: Negative for nausea, vomiting, abdominal pain and diarrhea.  Genitourinary: Negative for flank pain.  Musculoskeletal: Positive for neck pain. Negative for back pain and neck stiffness.  Skin: Negative for  rash.  Neurological: Negative for weakness, numbness and headaches.  Hematological: Bruises/bleeds easily.  Psychiatric/Behavioral: Negative for behavioral problems.      Allergies  Sulfa antibiotics  Home Medications   Prior to Admission medications   Medication Sig Start Date End Date Taking? Authorizing Provider  beta carotene w/minerals (OCUVITE) tablet Take 2 tablets by mouth once.    Yes Historical Provider, MD  DULoxetine (CYMBALTA) 20 MG capsule Take 20 mg by mouth 2 (two) times daily.   Yes Historical Provider, MD  gabapentin (NEURONTIN) 100 MG capsule Take 100 mg by mouth 3 (three) times daily.   Yes Historical Provider, MD  primidone (MYSOLINE) 50 MG tablet Take 50 mg by mouth 3 (three) times daily.  08/15/14  Yes Historical Provider, MD  Tamsulosin HCl (FLOMAX) 0.4 MG CAPS Take 0.4 mg by mouth daily after breakfast.    Yes Historical Provider, MD  valsartan-hydrochlorothiazide (DIOVAN-HCT) 160-25 MG per tablet Take 1 tablet by mouth daily.   Yes Historical Provider, MD  warfarin (COUMADIN) 2.5 MG tablet Take 3.75-5 mg by mouth See admin instructions. Take 1 and 1/2 tablets on Tuesday and Saturday then take 2 tablets the other days 05/02/14  Yes Belva Crome, MD  warfarin (COUMADIN) 2.5 MG tablet 2 TABLETS ON ALL DAYS EXCEPT 1 TABLET ON TUESDAYS AND SUNDAYS OR AS DIRECTED BY COUMADIN CLINIC Patient not taking: Reported on 05/19/2015 11/28/14   Belva Crome, MD   BP 107/55 mmHg  Pulse 62  Temp(Src) 97.4 F (  36.3 C) (Oral)  Resp 16  Ht 5\' 9"  (1.753 m)  Wt 246 lb (111.585 kg)  BMI 36.31 kg/m2  SpO2 96% Physical Exam  Constitutional: He is oriented to person, place, and time. He appears well-developed.  HENT:  Head: Normocephalic.  There is a possible bite mark to left cheek on the mucosal surface somewhat anteriorly.  Eyes: Pupils are equal, round, and reactive to light.  Neck: Normal range of motion. Neck supple.  Mild upper midline tenderness. No step-off deformity   Cardiovascular: Normal rate and regular rhythm.   Pulmonary/Chest: He exhibits tenderness.  Tenderness with some possible crepitance to left lower anterior chest wall. Good breath sounds bilaterally.  Abdominal: There is tenderness.  Left upper quadrant to subcostal tenderness.  Musculoskeletal: He exhibits tenderness (tenderness to chest wall and superior neck. No extremity tenderness).  Neurological: He is alert and oriented to person, place, and time.  Skin: Skin is warm.    ED Course  Procedures (including critical care time) Labs Review Labs Reviewed  CBC WITH DIFFERENTIAL/PLATELET - Abnormal; Notable for the following:    Platelets 135 (*)    All other components within normal limits  COMPREHENSIVE METABOLIC PANEL - Abnormal; Notable for the following:    Chloride 99 (*)    Glucose, Bld 119 (*)    BUN 22 (*)    Creatinine, Ser 1.54 (*)    Total Protein 6.0 (*)    Albumin 3.3 (*)    ALT 16 (*)    GFR calc non Af Amer 39 (*)    GFR calc Af Amer 45 (*)    All other components within normal limits  PROTIME-INR - Abnormal; Notable for the following:    Prothrombin Time 22.5 (*)    INR 1.99 (*)    All other components within normal limits  URINALYSIS, ROUTINE W REFLEX MICROSCOPIC - Abnormal; Notable for the following:    APPearance HAZY (*)    Hgb urine dipstick TRACE (*)    Leukocytes, UA MODERATE (*)    All other components within normal limits  URINE MICROSCOPIC-ADD ON - Abnormal; Notable for the following:    Bacteria, UA FEW (*)    All other components within normal limits  CBG MONITORING, ED - Abnormal; Notable for the following:    Glucose-Capillary 115 (*)    All other components within normal limits  I-STAT CHEM 8, ED - Abnormal; Notable for the following:    BUN 25 (*)    Creatinine, Ser 1.50 (*)    Glucose, Bld 123 (*)    Calcium, Ion 1.11 (*)    All other components within normal limits  URINE CULTURE  LIPASE, BLOOD  ETHANOL  I-STAT CG4 LACTIC ACID,  ED  I-STAT TROPOININ, ED  SAMPLE TO BLOOD BANK    Imaging Review Ct Head Wo Contrast  05/19/2015   CLINICAL DATA:  [place] this morning while using the bathroom, posterior neck and head pain, initial encounter, history hypertension, hyperlipidemia, coronary artery disease, TIA, former smoker  EXAM: CT HEAD WITHOUT CONTRAST  CT CERVICAL SPINE WITHOUT CONTRAST  TECHNIQUE: Multidetector CT imaging of the head and cervical spine was performed following the standard protocol without intravenous contrast. Multiplanar CT image reconstructions of the cervical spine were also generated.  COMPARISON:  None  FINDINGS: CT HEAD FINDINGS  Generalized atrophy.  Normal ventricular morphology.  No midline shift or mass effect.  Small vessel chronic ischemic changes of deep cerebral white matter.  Tiny old lacunar infarct at  LEFT basal ganglia and questionably RIGHT thalamus and LEFT pons.  No intracranial hemorrhage, mass lesion, or acute infarction.  No extra-axial fluid collections.  Visualized paranasal sinuses and mastoid air cells clear.  Bones unremarkable.  Atherosclerotic calcifications at the carotid siphons bilaterally.  CT CERVICAL SPINE FINDINGS  Prevertebral soft tissues normal thickness.  Scattered mild facet degenerative changes.  Minimal disc space narrowing and endplate spur formation C4-C5, C5-C6, C6-C7.  Vertebral body heights maintained without fracture or subluxation.  No bone destruction.  Visualized skullbase intact.  Visualized lung apices clear.  IMPRESSION: Atrophy with small vessel chronic ischemic changes of deep cerebral white matter.  Tiny old lacunar infarcts questioned.  No acute intracranial abnormalities.  Multilevel degenerative disc and facet disease changes cervical spine.  No acute cervical spine abnormalities.   Electronically Signed   By: Lavonia Dana M.D.   On: 05/19/2015 08:34   Ct Chest W Contrast  05/19/2015   CLINICAL DATA:  Fall this morning, neck pain, left rib pain  EXAM: CT  CHEST, ABDOMEN, AND PELVIS WITH CONTRAST  TECHNIQUE: Multidetector CT imaging of the chest, abdomen and pelvis was performed following the standard protocol during bolus administration of intravenous contrast.  CONTRAST:  184mL OMNIPAQUE IOHEXOL 300 MG/ML  SOLN  COMPARISON:  None.  FINDINGS: CT CHEST FINDINGS  Sagittal images of the spine shows degenerative changes thoracic spine. Sagittal view of the sternum is unremarkable. No scapular fracture is noted.  Central airways are patent.  Mild atherosclerotic calcifications of thoracic aorta. No mediastinal hematoma or adenopathy. Atherosclerotic calcifications of coronary arteries. Heart size within normal limits. Small hiatal hernia.  No displaced rib fractures are identified. Mild atelectasis noted bilateral lung bases posteriorly.  Axial image 49 there is probable subtle nondisplaced fracture in left lower anterior ribs/left base anteriorly. Probable left sixth rib. Mild adjacent pleural thickening.  Images of the lung parenchyma shows no lung contusion. There is no pneumothorax. There is mild displaced fracture of the left lateral ninth rib see axial image 65.  CT ABDOMEN AND PELVIS FINDINGS  Sagittal images of the spine shows degenerative changes lumbar spine. No acute fractures are noted within lumbar spine. Mild degenerative changes bilateral SI joints. No pelvic fractures are noted.  Enhanced liver shows no laceration. Probable cyst in right hepatic lobe laterally measures 1 cm. The pancreas, spleen and adrenal glands are unremarkable. Kidneys are symmetrical in size and enhancement. A cyst in lower pole of the right kidney measures 1 cm. Cyst in midpole of the left kidney posterior aspect measures 2.5 cm. Atherosclerotic calcifications of abdominal aorta and iliac arteries.  No hydronephrosis or hydroureter. Delayed renal images shows bilateral renal symmetrical excretion. No evidence of renal laceration. No calcified gallstones are noted within gallbladder.   No small bowel obstruction.  No ascites or free air.  No adenopathy.  Sigmoid colon diverticula are noted. No evidence of acute diverticulitis. There is mild thickening of wall of under distended urinary bladder. Mild cystitis cannot be excluded. Prostate gland calcifications are noted. The prostate gland is not enlarged. Seminal vesicles are unremarkable. Mild degenerative changes pubic symphysis.  Coronal images shows mild degenerative changes bilateral hip joints.  No definite sacral fracture is noted.  No aortic aneurysm.  IMPRESSION: 1. There is subtle nondisplaced fracture of the left anterior sixth rib. Mild displaced fracture of the left ninth rib. 2. No mediastinal hematoma or adenopathy. 3. No lung contusion or pneumothorax. 4. Small hiatal hernia. 5. No acute visceral injury within abdomen or pelvis.  6. Degenerative changes lumbar spine. 7. Degenerative changes bilateral SI joints and hip joints. Mild degenerative changes pubic symphysis. 8. No pelvic fractures. 9. Sigmoid colon diverticula are noted. No evidence of acute diverticulitis. 10. No small bowel obstruction. 11. Mild thickening of wall of under distended urinary bladder. Mild cystitis cannot be excluded.   Electronically Signed   By: Lahoma Crocker M.D.   On: 05/19/2015 08:41   Ct Cervical Spine Wo Contrast  05/19/2015   CLINICAL DATA:  [place] this morning while using the bathroom, posterior neck and head pain, initial encounter, history hypertension, hyperlipidemia, coronary artery disease, TIA, former smoker  EXAM: CT HEAD WITHOUT CONTRAST  CT CERVICAL SPINE WITHOUT CONTRAST  TECHNIQUE: Multidetector CT imaging of the head and cervical spine was performed following the standard protocol without intravenous contrast. Multiplanar CT image reconstructions of the cervical spine were also generated.  COMPARISON:  None  FINDINGS: CT HEAD FINDINGS  Generalized atrophy.  Normal ventricular morphology.  No midline shift or mass effect.  Small vessel  chronic ischemic changes of deep cerebral white matter.  Tiny old lacunar infarct at LEFT basal ganglia and questionably RIGHT thalamus and LEFT pons.  No intracranial hemorrhage, mass lesion, or acute infarction.  No extra-axial fluid collections.  Visualized paranasal sinuses and mastoid air cells clear.  Bones unremarkable.  Atherosclerotic calcifications at the carotid siphons bilaterally.  CT CERVICAL SPINE FINDINGS  Prevertebral soft tissues normal thickness.  Scattered mild facet degenerative changes.  Minimal disc space narrowing and endplate spur formation C4-C5, C5-C6, C6-C7.  Vertebral body heights maintained without fracture or subluxation.  No bone destruction.  Visualized skullbase intact.  Visualized lung apices clear.  IMPRESSION: Atrophy with small vessel chronic ischemic changes of deep cerebral white matter.  Tiny old lacunar infarcts questioned.  No acute intracranial abnormalities.  Multilevel degenerative disc and facet disease changes cervical spine.  No acute cervical spine abnormalities.   Electronically Signed   By: Lavonia Dana M.D.   On: 05/19/2015 08:34   Ct Abdomen Pelvis W Contrast  05/19/2015   CLINICAL DATA:  Fall this morning, neck pain, left rib pain  EXAM: CT CHEST, ABDOMEN, AND PELVIS WITH CONTRAST  TECHNIQUE: Multidetector CT imaging of the chest, abdomen and pelvis was performed following the standard protocol during bolus administration of intravenous contrast.  CONTRAST:  122mL OMNIPAQUE IOHEXOL 300 MG/ML  SOLN  COMPARISON:  None.  FINDINGS: CT CHEST FINDINGS  Sagittal images of the spine shows degenerative changes thoracic spine. Sagittal view of the sternum is unremarkable. No scapular fracture is noted.  Central airways are patent.  Mild atherosclerotic calcifications of thoracic aorta. No mediastinal hematoma or adenopathy. Atherosclerotic calcifications of coronary arteries. Heart size within normal limits. Small hiatal hernia.  No displaced rib fractures are  identified. Mild atelectasis noted bilateral lung bases posteriorly.  Axial image 49 there is probable subtle nondisplaced fracture in left lower anterior ribs/left base anteriorly. Probable left sixth rib. Mild adjacent pleural thickening.  Images of the lung parenchyma shows no lung contusion. There is no pneumothorax. There is mild displaced fracture of the left lateral ninth rib see axial image 65.  CT ABDOMEN AND PELVIS FINDINGS  Sagittal images of the spine shows degenerative changes lumbar spine. No acute fractures are noted within lumbar spine. Mild degenerative changes bilateral SI joints. No pelvic fractures are noted.  Enhanced liver shows no laceration. Probable cyst in right hepatic lobe laterally measures 1 cm. The pancreas, spleen and adrenal glands are unremarkable. Kidneys are  symmetrical in size and enhancement. A cyst in lower pole of the right kidney measures 1 cm. Cyst in midpole of the left kidney posterior aspect measures 2.5 cm. Atherosclerotic calcifications of abdominal aorta and iliac arteries.  No hydronephrosis or hydroureter. Delayed renal images shows bilateral renal symmetrical excretion. No evidence of renal laceration. No calcified gallstones are noted within gallbladder.  No small bowel obstruction.  No ascites or free air.  No adenopathy.  Sigmoid colon diverticula are noted. No evidence of acute diverticulitis. There is mild thickening of wall of under distended urinary bladder. Mild cystitis cannot be excluded. Prostate gland calcifications are noted. The prostate gland is not enlarged. Seminal vesicles are unremarkable. Mild degenerative changes pubic symphysis.  Coronal images shows mild degenerative changes bilateral hip joints.  No definite sacral fracture is noted.  No aortic aneurysm.  IMPRESSION: 1. There is subtle nondisplaced fracture of the left anterior sixth rib. Mild displaced fracture of the left ninth rib. 2. No mediastinal hematoma or adenopathy. 3. No lung  contusion or pneumothorax. 4. Small hiatal hernia. 5. No acute visceral injury within abdomen or pelvis. 6. Degenerative changes lumbar spine. 7. Degenerative changes bilateral SI joints and hip joints. Mild degenerative changes pubic symphysis. 8. No pelvic fractures. 9. Sigmoid colon diverticula are noted. No evidence of acute diverticulitis. 10. No small bowel obstruction. 11. Mild thickening of wall of under distended urinary bladder. Mild cystitis cannot be excluded.   Electronically Signed   By: Lahoma Crocker M.D.   On: 05/19/2015 08:41   Dg Chest Portable 1 View  05/19/2015   CLINICAL DATA:  Recent fall with left-sided chest pain  EXAM: PORTABLE CHEST - 1 VIEW  COMPARISON:  11/02/2013  FINDINGS: Cardiac shadow is stable. Thoracic aorta is again tortuous with mild calcifications. The lungs are well aerated with very minimal left basilar atelectasis. No bony abnormality is seen. No pneumothorax is noted.  IMPRESSION: Mild left basilar atelectasis.   Electronically Signed   By: Inez Catalina M.D.   On: 05/19/2015 07:19     EKG Interpretation   Date/Time:  Friday May 19 2015 06:21:12 EDT Ventricular Rate:  58 PR Interval:    QRS Duration: 97 QT Interval:  432 QTC Calculation: 424 R Axis:   -83 Text Interpretation:  Atrial fibrillation Left anterior fascicular block  Confirmed by Glynn Octave [id number]) on 05/19/2015 6:26:00 AM Also  confirmed by Alvino Chapel  MD, Ovid Curd [phone])  on 05/19/2015 7:10:32 AM      MDM   Final diagnoses:  Syncope, unspecified syncope type  UTI (lower urinary tract infection)  Rib fractures, left, closed, initial encounter    Patient with syncope. History of A. fib and bradycardia. Also has had Mobitz block in the past. Has 2 rib fractures from a fall. No other severe injury. Will admit to internal medicine.     Davonna Belling, MD 05/19/15 1012

## 2015-05-19 NOTE — ED Notes (Signed)
Dr. Pickering back in to speak with patient.  

## 2015-05-19 NOTE — Progress Notes (Signed)
  Echocardiogram 2D Echocardiogram has been performed.  Donata Clay 05/19/2015, 12:27 PM

## 2015-05-19 NOTE — Progress Notes (Signed)
ANTICOAGULATION CONSULT NOTE - Initial Consult  Pharmacy Consult for coumadin Indication: atrial fibrillation  Allergies  Allergen Reactions  . Sulfa Antibiotics Other (See Comments)    Weakness, dizziness    Patient Measurements: Height: 5\' 9"  (175.3 cm) Weight: 246 lb (111.585 kg) IBW/kg (Calculated) : 70.7   Vital Signs: Temp: 97.4 F (36.3 C) (05/20 0628) Temp Source: Oral (05/20 0628) BP: 113/56 mmHg (05/20 1100) Pulse Rate: 55 (05/20 1100)  Labs:  Recent Labs  05/19/15 0630 05/19/15 0638  HGB 13.5 14.6  HCT 40.6 43.0  PLT 135*  --   LABPROT 22.5*  --   INR 1.99*  --   CREATININE 1.54* 1.50*    Estimated Creatinine Clearance: 42.7 mL/min (by C-G formula based on Cr of 1.5).   Medical History: Past Medical History  Diagnosis Date  . Hypertension   . Hyperlipemia   . Coronary artery disease   . TIA (transient ischemic attack)   . Hernia   . GERD (gastroesophageal reflux disease)   . Macular degeneration   . Paroxysmal atrial fibrillation 10/28/2013  . Tachycardia-bradycardia syndrome 09/13/2014    Medications:  Home coumadin dose 3.75 Tu and Sat and 5 mg other days, last dose 5/19  Assessment: 81 yo man to continue coumadin for afib.  INR 1.99 Goal of Therapy:  INR 2-3 Monitor platelets by anticoagulation protocol: Yes   Plan:  Coumadin 5 mg po today Daily PT/INR  Markan Cazarez Poteet 05/19/2015,11:13 AM

## 2015-05-19 NOTE — ED Notes (Signed)
Admitting at bedside 

## 2015-05-19 NOTE — ED Notes (Signed)
CBG: 115 

## 2015-05-19 NOTE — Progress Notes (Signed)
   05/19/15 0600  Clinical Encounter Type  Visited With Family;Health care provider  Visit Type Initial;ED;Trauma   Chaplain was paged to the ED for a level 2 trauma at 6:16 AM. Patient sustained injury due to a fall. When chaplain arrived, patient was alert and communicating with ED physician. Patient's son was already at the hospital waiting in the lobby. Chaplain introduced himself to patient's son and escorted him to bedside. No further chaplain support appeared needed at this time. Page Elodia Florence chaplain if patient or patient's family needs further support this morning.  Gar Ponto, Chaplain  6:34 AM

## 2015-05-19 NOTE — H&P (Signed)
[facility] History and Physical                                                                                    Douglas Walker, is a 81 y.o. male  MRN: [id number]   DOB - 07-02-1925  Admit Date - 05/19/2015  Outpatient Primary MD for the patient is Marjorie Smolder, MD  Referring Physician:    Chief Complaint:   Chief Complaint  Patient presents with  . Fall     HPI  Douglas Walker  is a 81 y.o. male, with a past medical history of paroxysmal atrial fibrillation (on Coumadin), Mobitz type II AV block, coronary artery disease, TIA, and hypertension who presents from home with his son after a syncopal episode last night. The patient reports that he went to bed about 11 PM he got up 2 times during the night to urinate. He got up a third time and proximally 5 AM to go to the bathroom. He doesn't remember anything after that. He had no dizziness or double vision, and no headache prior to losing consciousness. His son states that he heard his father fall and got up to go check on him. He found him sleeping/snoring on the floor with blood on his face and head. The patient denies any dizziness or headaches, recent illness, difficulty urinating or dysuria. He denies any palpitations, chest pain, difficulty breathing. There is no history of sudden cardiac death in his family.  In the emergency department the patient was found to have 2 broken ribs.  He has been relatively hypotensive and hypoxic (88%). Hgb is stable.  Creatinine is elevated at 1.5 but it appears that this is below his baseline.  U/A is positive for infection.  Review of Systems   In addition to the HPI above,  No Fever-chills, No Headache, No changes with Vision or hearing, No problems swallowing food or Liquids, No Chest pain, Cough or Shortness of Breath, No Abdominal pain, No Nausea or Vomiting, Bowel movements are regular, No Blood in stool or Urine, No dysuria, No new skin rashes or bruises, No new joints  pains-aches,  No new weakness, tingling, numbness in any extremity, No recent weight gain or loss, A full 10 point Review of Systems was done, except as stated above, all other Review of Systems were negative.  Past Medical History  Past Medical History  Diagnosis Date  . Hypertension   . Hyperlipemia   . Coronary artery disease   . TIA (transient ischemic attack)   . Hernia   . GERD (gastroesophageal reflux disease)   . Macular degeneration   . Paroxysmal atrial fibrillation 10/28/2013  . Tachycardia-bradycardia syndrome 09/13/2014    Past Surgical History  Procedure Laterality Date  . Hernia repair    . Appendectomy    . Hernia repair        Social History History  Substance Use Topics  . Smoking status: Former Smoker    Quit date: 12/30/1962  . Smokeless tobacco: Never Used  . Alcohol Use: No  Currently living with son.  Has Bed bugs in his home. Independent with ADLs.  Family History his mother died age 48 from  stomach problems. He did not know his father.   Prior to Admission medications   Medication Sig Start Date End Date Taking? Authorizing Provider  beta carotene w/minerals (OCUVITE) tablet Take 2 tablets by mouth once.    Yes Historical Provider, MD  DULoxetine (CYMBALTA) 20 MG capsule Take 20 mg by mouth 2 (two) times daily.   Yes Historical Provider, MD  gabapentin (NEURONTIN) 100 MG capsule Take 100 mg by mouth 3 (three) times daily.   Yes Historical Provider, MD  primidone (MYSOLINE) 50 MG tablet Take 50 mg by mouth 3 (three) times daily.  08/15/14  Yes Historical Provider, MD  Tamsulosin HCl (FLOMAX) 0.4 MG CAPS Take 0.4 mg by mouth daily after breakfast.    Yes Historical Provider, MD  valsartan-hydrochlorothiazide (DIOVAN-HCT) 160-25 MG per tablet Take 1 tablet by mouth daily.   Yes Historical Provider, MD  warfarin (COUMADIN) 2.5 MG tablet Take 3.75-5 mg by mouth See admin instructions. Take 1 and 1/2 tablets on Tuesday and Saturday then take 2 tablets  the other days 05/02/14  Yes Belva Crome, MD  warfarin (COUMADIN) 2.5 MG tablet 2 TABLETS ON ALL DAYS EXCEPT 1 TABLET ON TUESDAYS AND SUNDAYS OR AS DIRECTED BY COUMADIN CLINIC Patient not taking: Reported on 05/19/2015 11/28/14   Belva Crome, MD    Allergies  Allergen Reactions  . Sulfa Antibiotics Other (See Comments)    Weakness, dizziness    Physical Exam  Vitals  Blood pressure 113/56, pulse 55, temperature 97.4 F (36.3 C), temperature source Oral, resp. rate 16, height 5\' 9"  (1.753 m), weight 111.585 kg (246 lb), SpO2 99 %.   General: Well-developed, well-nourished elderly male lying in bed in NAD, hard of hearing. Son at bedside  Psych:  Normal affect and insight, Not Suicidal or Homicidal, Awake Alert, Oriented X 3.  Neuro:   No F.N deficits, ALL C.Nerves Intact, Strength 5/5 all 4 extremities, Sensation intact all 4 extremities. Mild horizontal nystagmus noted when testing extraocular movements.  ENT:  Ears and Eyes appear Normal, Conjunctivae clear, PER. Moist oral mucosa without erythema or exudates.  Neck:  Supple, No lymphadenopathy appreciated  Respiratory:  Symmetrical chest wall movement, Good air movement bilaterally, CTAB. Breathing is stilted due to pain.  Cardiac: Difficult to hear, bradycardic  No Murmurs, no LE edema noted, no JVD.    Abdomen:  Positive bowel sounds, Soft, Non tender, mildly distended,  No masses appreciated  Skin:  No Cyanosis, Normal Skin Turgor, No Skin Rash or Bruise.  Extremities:  Able to move all 4. 5/5 strength in each,  no effusions.  Data Review  CBC  Recent Labs Lab 05/19/15 0630 05/19/15 0638  WBC 7.1  --   HGB 13.5 14.6  HCT 40.6 43.0  PLT 135*  --   MCV 94.4  --   MCH 31.4  --   MCHC 33.3  --   RDW 14.1  --   LYMPHSABS 2.1  --   MONOABS 0.6  --   EOSABS 0.1  --   BASOSABS 0.0  --     Chemistries   Recent Labs Lab 05/19/15 0630 05/19/15 0638  NA 136 138  K 3.6 3.6  CL 99* 101  CO2 26  --    GLUCOSE 119* 123*  BUN 22* 25*  CREATININE 1.54* 1.50*  CALCIUM 9.1  --   AST 18  --   ALT 16*  --   ALKPHOS 73  --   BILITOT 0.6  --  Coagulation profile  Recent Labs Lab 05/19/15 0630  INR 1.99*    Urinalysis    Component Value Date/Time   COLORURINE YELLOW 05/19/2015 0738   APPEARANCEUR HAZY* 05/19/2015 0738   LABSPEC 1.012 05/19/2015 0738   PHURINE 7.0 05/19/2015 [place] 05/19/2015 0738   HGBUR TRACE* 05/19/2015 0738   BILIRUBINUR NEGATIVE 05/19/2015 0738   KETONESUR NEGATIVE 05/19/2015 0738   PROTEINUR NEGATIVE 05/19/2015 0738   UROBILINOGEN 0.2 05/19/2015 0738   NITRITE NEGATIVE 05/19/2015 0738   LEUKOCYTESUR MODERATE* 05/19/2015 0738    Imaging results:   Ct Head Wo Contrast  05/19/2015   CLINICAL DATA:  [place] this morning while using the bathroom, posterior neck and head pain, initial encounter, history hypertension, hyperlipidemia, coronary artery disease, TIA, former smoker  EXAM: CT HEAD WITHOUT CONTRAST  CT CERVICAL SPINE WITHOUT CONTRAST  TECHNIQUE: Multidetector CT imaging of the head and cervical spine was performed following the standard protocol without intravenous contrast. Multiplanar CT image reconstructions of the cervical spine were also generated.  COMPARISON:  None  FINDINGS: CT HEAD FINDINGS  Generalized atrophy.  Normal ventricular morphology.  No midline shift or mass effect.  Small vessel chronic ischemic changes of deep cerebral white matter.  Tiny old lacunar infarct at LEFT basal ganglia and questionably RIGHT thalamus and LEFT pons.  No intracranial hemorrhage, mass lesion, or acute infarction.  No extra-axial fluid collections.  Visualized paranasal sinuses and mastoid air cells clear.  Bones unremarkable.  Atherosclerotic calcifications at the carotid siphons bilaterally.  CT CERVICAL SPINE FINDINGS  Prevertebral soft tissues normal thickness.  Scattered mild facet degenerative changes.  Minimal disc space narrowing and  endplate spur formation C4-C5, C5-C6, C6-C7.  Vertebral body heights maintained without fracture or subluxation.  No bone destruction.  Visualized skullbase intact.  Visualized lung apices clear.  IMPRESSION: Atrophy with small vessel chronic ischemic changes of deep cerebral white matter.  Tiny old lacunar infarcts questioned.  No acute intracranial abnormalities.  Multilevel degenerative disc and facet disease changes cervical spine.  No acute cervical spine abnormalities.   Electronically Signed   By: Lavonia Dana M.D.   On: 05/19/2015 08:34   Ct Chest W Contrast  05/19/2015   CLINICAL DATA:  Fall this morning, neck pain, left rib pain  EXAM: CT CHEST, ABDOMEN, AND PELVIS WITH CONTRAST  TECHNIQUE: Multidetector CT imaging of the chest, abdomen and pelvis was performed following the standard protocol during bolus administration of intravenous contrast.  CONTRAST:  162mL OMNIPAQUE IOHEXOL 300 MG/ML  SOLN  COMPARISON:  None.  FINDINGS: CT CHEST FINDINGS  Sagittal images of the spine shows degenerative changes thoracic spine. Sagittal view of the sternum is unremarkable. No scapular fracture is noted.  Central airways are patent.  Mild atherosclerotic calcifications of thoracic aorta. No mediastinal hematoma or adenopathy. Atherosclerotic calcifications of coronary arteries. Heart size within normal limits. Small hiatal hernia.  No displaced rib fractures are identified. Mild atelectasis noted bilateral lung bases posteriorly.  Axial image 49 there is probable subtle nondisplaced fracture in left lower anterior ribs/left base anteriorly. Probable left sixth rib. Mild adjacent pleural thickening.  Images of the lung parenchyma shows no lung contusion. There is no pneumothorax. There is mild displaced fracture of the left lateral ninth rib see axial image 65.  CT ABDOMEN AND PELVIS FINDINGS  Sagittal images of the spine shows degenerative changes lumbar spine. No acute fractures are noted within lumbar spine. Mild  degenerative changes bilateral SI joints. No pelvic fractures are noted.  Enhanced liver shows no laceration. Probable cyst in right hepatic lobe laterally measures 1 cm. The pancreas, spleen and adrenal glands are unremarkable. Kidneys are symmetrical in size and enhancement. A cyst in lower pole of the right kidney measures 1 cm. Cyst in midpole of the left kidney posterior aspect measures 2.5 cm. Atherosclerotic calcifications of abdominal aorta and iliac arteries.  No hydronephrosis or hydroureter. Delayed renal images shows bilateral renal symmetrical excretion. No evidence of renal laceration. No calcified gallstones are noted within gallbladder.  No small bowel obstruction.  No ascites or free air.  No adenopathy.  Sigmoid colon diverticula are noted. No evidence of acute diverticulitis. There is mild thickening of wall of under distended urinary bladder. Mild cystitis cannot be excluded. Prostate gland calcifications are noted. The prostate gland is not enlarged. Seminal vesicles are unremarkable. Mild degenerative changes pubic symphysis.  Coronal images shows mild degenerative changes bilateral hip joints.  No definite sacral fracture is noted.  No aortic aneurysm.  IMPRESSION: 1. There is subtle nondisplaced fracture of the left anterior sixth rib. Mild displaced fracture of the left ninth rib. 2. No mediastinal hematoma or adenopathy. 3. No lung contusion or pneumothorax. 4. Small hiatal hernia. 5. No acute visceral injury within abdomen or pelvis. 6. Degenerative changes lumbar spine. 7. Degenerative changes bilateral SI joints and hip joints. Mild degenerative changes pubic symphysis. 8. No pelvic fractures. 9. Sigmoid colon diverticula are noted. No evidence of acute diverticulitis. 10. No small bowel obstruction. 11. Mild thickening of wall of under distended urinary bladder. Mild cystitis cannot be excluded.   Electronically Signed   By: Lahoma Crocker M.D.   On: 05/19/2015 08:41   Ct Cervical Spine  Wo Contrast  05/19/2015   CLINICAL DATA:  [place] this morning while using the bathroom, posterior neck and head pain, initial encounter, history hypertension, hyperlipidemia, coronary artery disease, TIA, former smoker  EXAM: CT HEAD WITHOUT CONTRAST  CT CERVICAL SPINE WITHOUT CONTRAST  TECHNIQUE: Multidetector CT imaging of the head and cervical spine was performed following the standard protocol without intravenous contrast. Multiplanar CT image reconstructions of the cervical spine were also generated.  COMPARISON:  None  FINDINGS: CT HEAD FINDINGS  Generalized atrophy.  Normal ventricular morphology.  No midline shift or mass effect.  Small vessel chronic ischemic changes of deep cerebral white matter.  Tiny old lacunar infarct at LEFT basal ganglia and questionably RIGHT thalamus and LEFT pons.  No intracranial hemorrhage, mass lesion, or acute infarction.  No extra-axial fluid collections.  Visualized paranasal sinuses and mastoid air cells clear.  Bones unremarkable.  Atherosclerotic calcifications at the carotid siphons bilaterally.  CT CERVICAL SPINE FINDINGS  Prevertebral soft tissues normal thickness.  Scattered mild facet degenerative changes.  Minimal disc space narrowing and endplate spur formation C4-C5, C5-C6, C6-C7.  Vertebral body heights maintained without fracture or subluxation.  No bone destruction.  Visualized skullbase intact.  Visualized lung apices clear.  IMPRESSION: Atrophy with small vessel chronic ischemic changes of deep cerebral white matter.  Tiny old lacunar infarcts questioned.  No acute intracranial abnormalities.  Multilevel degenerative disc and facet disease changes cervical spine.  No acute cervical spine abnormalities.   Electronically Signed   By: Lavonia Dana M.D.   On: 05/19/2015 08:34   Ct Abdomen Pelvis W Contrast  05/19/2015   CLINICAL DATA:  Fall this morning, neck pain, left rib pain  EXAM: CT CHEST, ABDOMEN, AND PELVIS WITH CONTRAST  TECHNIQUE: Multidetector CT  imaging of the chest, abdomen  and pelvis was performed following the standard protocol during bolus administration of intravenous contrast.  CONTRAST:  135mL OMNIPAQUE IOHEXOL 300 MG/ML  SOLN  COMPARISON:  None.  FINDINGS: CT CHEST FINDINGS  Sagittal images of the spine shows degenerative changes thoracic spine. Sagittal view of the sternum is unremarkable. No scapular fracture is noted.  Central airways are patent.  Mild atherosclerotic calcifications of thoracic aorta. No mediastinal hematoma or adenopathy. Atherosclerotic calcifications of coronary arteries. Heart size within normal limits. Small hiatal hernia.  No displaced rib fractures are identified. Mild atelectasis noted bilateral lung bases posteriorly.  Axial image 49 there is probable subtle nondisplaced fracture in left lower anterior ribs/left base anteriorly. Probable left sixth rib. Mild adjacent pleural thickening.  Images of the lung parenchyma shows no lung contusion. There is no pneumothorax. There is mild displaced fracture of the left lateral ninth rib see axial image 65.  CT ABDOMEN AND PELVIS FINDINGS  Sagittal images of the spine shows degenerative changes lumbar spine. No acute fractures are noted within lumbar spine. Mild degenerative changes bilateral SI joints. No pelvic fractures are noted.  Enhanced liver shows no laceration. Probable cyst in right hepatic lobe laterally measures 1 cm. The pancreas, spleen and adrenal glands are unremarkable. Kidneys are symmetrical in size and enhancement. A cyst in lower pole of the right kidney measures 1 cm. Cyst in midpole of the left kidney posterior aspect measures 2.5 cm. Atherosclerotic calcifications of abdominal aorta and iliac arteries.  No hydronephrosis or hydroureter. Delayed renal images shows bilateral renal symmetrical excretion. No evidence of renal laceration. No calcified gallstones are noted within gallbladder.  No small bowel obstruction.  No ascites or free air.  No adenopathy.   Sigmoid colon diverticula are noted. No evidence of acute diverticulitis. There is mild thickening of wall of under distended urinary bladder. Mild cystitis cannot be excluded. Prostate gland calcifications are noted. The prostate gland is not enlarged. Seminal vesicles are unremarkable. Mild degenerative changes pubic symphysis.  Coronal images shows mild degenerative changes bilateral hip joints.  No definite sacral fracture is noted.  No aortic aneurysm.  IMPRESSION: 1. There is subtle nondisplaced fracture of the left anterior sixth rib. Mild displaced fracture of the left ninth rib. 2. No mediastinal hematoma or adenopathy. 3. No lung contusion or pneumothorax. 4. Small hiatal hernia. 5. No acute visceral injury within abdomen or pelvis. 6. Degenerative changes lumbar spine. 7. Degenerative changes bilateral SI joints and hip joints. Mild degenerative changes pubic symphysis. 8. No pelvic fractures. 9. Sigmoid colon diverticula are noted. No evidence of acute diverticulitis. 10. No small bowel obstruction. 11. Mild thickening of wall of under distended urinary bladder. Mild cystitis cannot be excluded.   Electronically Signed   By: Lahoma Crocker M.D.   On: 05/19/2015 08:41   Dg Chest Portable 1 View  05/19/2015   CLINICAL DATA:  Recent fall with left-sided chest pain  EXAM: PORTABLE CHEST - 1 VIEW  COMPARISON:  11/02/2013  FINDINGS: Cardiac shadow is stable. Thoracic aorta is again tortuous with mild calcifications. The lungs are well aerated with very minimal left basilar atelectasis. No bony abnormality is seen. No pneumothorax is noted.  IMPRESSION: Mild left basilar atelectasis.   Electronically Signed   By: Inez Catalina M.D.   On: 05/19/2015 07:19    My personal review of EKG: 1 AV block, bradycardic, normal QT   Assessment & Plan  Principal Problem:   Syncope Active Problems:   UTI (lower urinary tract infection)  Hypotension   Fracture of rib of left side   Paroxysmal atrial fibrillation    Mobitz type II atrioventricular block   Essential hypertension   Chronic anticoagulation   Dehydration   Syncope and collapse  Syncope and collapse Multifactorial: UTI, dehydration, possible orthostasis, possible arrhythmia. Patient had some confusion when waking - will check MR to rule out CVA. Will check TSH, 2-D echo, check orthostatic vital signs, monitor on telemetry with continuous pulse ox. Physical therapy and occupational therapy evaluations requested. Cardiology consulted.  Rib fractures (6th and 9th ribs on the left side) Due to fall.  Now causing significant pain. Incentive spirometry ordered. Pain medications ordered. PT, OT evaluations requested.  Urinary tract infection History of BPH.  UA positive Culture pending Started on Rocephin  Hypotension. Normally hypertensive Possibly due to dehydration and infection Hold HCTZ and valsartan. Give gentle IV hydration.  Paroxysmal atrial fibrillation/history of Mobitz type II AV block and tachybradycardia syndrome Currently bradycardic. Not on beta blockers Is on chronic Coumadin. Coumadin per pharmacy ordered  Tremor Continue primidone  Neuropathy Continue gabapentin     Consultants Called:  None  Family Communication:   Son [name] at bedside  Code Status:  DO NOT RESUSCITATE  Condition:  Guarded  Potential Disposition: To be determined. Potentially home in 48 hours  Time spent in minutes : [address],  PA-C on 05/19/2015 at 11:18 AM Between 7am to 7pm - Pager - [phone] After 7pm go to [email or web address] - password [email or web address] And look for the night coverage person covering me after hours  [facility]

## 2015-05-19 NOTE — ED Notes (Signed)
Patient presents via EMS after a fall at home.  Patient states he got up to use the BR and remembers urinating and the next thing he remembers is laying on the floor with family talking to him. Bruising noted to upper lip and c/o pain to the left rib area  EMS adm 262ml saline

## 2015-05-19 NOTE — ED Notes (Signed)
Pt transported to CT ?

## 2015-05-19 NOTE — ED Notes (Signed)
Family at bedside. 

## 2015-05-20 ENCOUNTER — Other Ambulatory Visit: Payer: Self-pay | Admitting: Physician Assistant

## 2015-05-20 DIAGNOSIS — R55 Syncope and collapse: Secondary | ICD-10-CM

## 2015-05-20 DIAGNOSIS — I480 Paroxysmal atrial fibrillation: Secondary | ICD-10-CM

## 2015-05-20 DIAGNOSIS — S2232XB Fracture of one rib, left side, initial encounter for open fracture: Secondary | ICD-10-CM

## 2015-05-20 DIAGNOSIS — I959 Hypotension, unspecified: Secondary | ICD-10-CM

## 2015-05-20 DIAGNOSIS — N390 Urinary tract infection, site not specified: Secondary | ICD-10-CM

## 2015-05-20 DIAGNOSIS — W19XXXS Unspecified fall, sequela: Secondary | ICD-10-CM

## 2015-05-20 DIAGNOSIS — W19XXXA Unspecified fall, initial encounter: Secondary | ICD-10-CM | POA: Insufficient documentation

## 2015-05-20 LAB — BASIC METABOLIC PANEL
Anion gap: 7 (ref 5–15)
BUN: 19 mg/dL (ref 6–20)
CO2: 27 mmol/L (ref 22–32)
Calcium: 8.7 mg/dL — ABNORMAL LOW (ref 8.9–10.3)
Chloride: 102 mmol/L (ref 101–111)
Creatinine, Ser: 1.51 mg/dL — ABNORMAL HIGH (ref 0.61–1.24)
GFR calc Af Amer: 46 mL/min — ABNORMAL LOW (ref >60)
GFR calc non Af Amer: 40 mL/min — ABNORMAL LOW (ref >60)
Glucose, Bld: 97 mg/dL (ref 65–99)
Potassium: 3.9 mmol/L (ref 3.5–5.1)
Sodium: 136 mmol/L (ref 135–145)

## 2015-05-20 LAB — CBC
HCT: 37.2 % — ABNORMAL LOW (ref 39.0–52.0)
Hemoglobin: 12.4 g/dL — ABNORMAL LOW (ref 13.0–17.0)
MCH: 31.9 pg (ref 26.0–34.0)
MCHC: 33.3 g/dL (ref 30.0–36.0)
MCV: 95.6 fL (ref 78.0–100.0)
Platelets: 120 10*3/uL — ABNORMAL LOW (ref 150–400)
RBC: 3.89 MIL/uL — ABNORMAL LOW (ref 4.22–5.81)
RDW: 14.4 % (ref 11.5–15.5)
WBC: 7.3 10*3/uL (ref 4.0–10.5)

## 2015-05-20 LAB — PROTIME-INR
INR: 1.88 — ABNORMAL HIGH (ref 0.00–1.49)
Prothrombin Time: 21.5 seconds — ABNORMAL HIGH (ref 11.6–15.2)

## 2015-05-20 LAB — GLUCOSE, CAPILLARY: Glucose-Capillary: 110 mg/dL — ABNORMAL HIGH (ref 65–99)

## 2015-05-20 MED ORDER — WARFARIN SODIUM 5 MG PO TABS
5.0000 mg | ORAL_TABLET | Freq: Once | ORAL | Status: AC
  Administered 2015-05-20: 5 mg via ORAL
  Filled 2015-05-20: qty 1

## 2015-05-20 NOTE — Progress Notes (Signed)
[facility]                                                                              Patient Demographics  Douglas Walker, is a 81 y.o. male, DOB - [birth year], XBD:[id number]  Admit date - 05/19/2015   Admitting Physician Janece Canterbury, MD  Outpatient Primary MD for the patient is Marjorie Smolder, MD  LOS - 1   Chief Complaint  Patient presents with  . Fall      HPI on 05/19/2015 by Ms. Imogene Burn, [place] Douglas Walker is a 81 y.o. male, with a past medical history of paroxysmal atrial fibrillation (on Coumadin), Mobitz type II AV block, coronary artery disease, TIA, and hypertension who presents from home with his son after a syncopal episode last night. The patient reports that he went to bed about 11 PM he got up 2 times during the night to urinate. He got up a third time and proximally 5 AM to go to the bathroom. He doesn't remember anything after that. He had no dizziness or double vision, and no headache prior to losing consciousness. His son states that he heard his father fall and got up to go check on him. He found him sleeping/snoring on the floor with blood on his face and head. The patient denies any dizziness or headaches, recent illness, difficulty urinating or dysuria. He denies any palpitations, chest pain, difficulty breathing. There is no history of sudden cardiac death in his family. In the emergency department the patient was found to have 2 broken ribs. He has been relatively hypotensive and hypoxic (88%). Hgb is stable. Creatinine is elevated at 1.5 but it appears that this is below his baseline. U/A is positive for infection.  Assessment & Plan   Syncope -Unclear etiology, possibly vasovagal with maturation versus AV block -Echo: EF of 55-60%, no regional wall abnormality, mild AR -Cardiology consultation appreciated as patient had questionable second-degree Mobitz type II AV block -Patient may get a 30 day event monitor -Pending further  recommendations will cardiology  Rib fractures (6th and 9th ribs on the left side) -Secondary to ground-level fall -Continue incentive spirometry -PT and OT consult -Continue pain management  Urinary tract infection -History of BPH. UA positive -Culture pending -Continue Rocephin and Flomax   Hypotension. Normally hypertensive -Possibly due to dehydration and infection -Appears to have resolved  -Valsartan HCTZ held   Paroxysmal atrial fibrillation/history of Mobitz type II AV block and tachybradycardia syndrome -Patient is on Coumadin, continue per pharmacy -INR subtherapeutic at 1.88  Tremor -Continue primidone  Neuropathy -Continue gabapentin  Code Status: DNR  Family Communication: None at bedside  Disposition Plan: Admitted  Time Spent in minutes   30 minutes  Procedures  None  Consults   Cardiology  DVT Prophylaxis  Coumadin  Lab Results  Component Value Date   PLT 120* 05/20/2015    Medications  Scheduled Meds: . cefTRIAXone (ROCEPHIN)  IV  1 g Intravenous Q24H  . docusate sodium  100 mg Oral BID  . DULoxetine  20 mg Oral BID  . gabapentin  100 mg Oral TID  . lidocaine  1 patch Transdermal Q24H  . LORazepam  1 mg Intravenous  Once  . primidone  50 mg Oral TID  . sodium chloride  3 mL Intravenous Q12H  . tamsulosin  0.4 mg Oral QPC breakfast  . warfarin  5 mg Oral ONCE-1800  . Warfarin - Pharmacist Dosing Inpatient   Does not apply q1800   Continuous Infusions:  PRN Meds:.acetaminophen **OR** acetaminophen, alum & mag hydroxide-simeth, HYDROcodone-acetaminophen, ondansetron **OR** ondansetron (ZOFRAN) IV, senna-docusate  Antibiotics    Anti-infectives    Start     Dose/Rate Route Frequency Ordered Stop   05/20/15 1100  cefTRIAXone (ROCEPHIN) 1 g in dextrose 5 % 50 mL IVPB - Premix     1 g 100 mL/hr over 30 Minutes Intravenous Every 24 hours 05/19/15 1132     05/19/15 1115  cefTRIAXone (ROCEPHIN) 1 g in dextrose 5 % 50 mL IVPB  Status:   Discontinued     1 g 100 mL/hr over 30 Minutes Intravenous Every 24 hours 05/19/15 1103 05/19/15 1132   05/19/15 0930  cefTRIAXone (ROCEPHIN) 1 g in dextrose 5 % 50 mL IVPB  Status:  Discontinued     1 g 100 mL/hr over 30 Minutes Intravenous  Once 05/19/15 8119 05/19/15 1103        Subjective:   Douglas Walker seen and examined today.  Patient complains of soreness on the left.  He feels it is difficult to breath in deeply.  He denies chest pain, abdominal pain, dizziness, headache.    Objective:   Filed Vitals:   05/19/15 1833 05/19/15 2208 05/20/15 0251 05/20/15 0543  BP: 109/52 128/64 122/68 125/52  Pulse: 64 68 65 68  Temp: 98.4 F (36.9 C) 98.3 F (36.8 C) 98.2 F (36.8 C) 98.2 F (36.8 C)  TempSrc: Oral Oral Oral Oral  Resp: 18 18 18 18   Height:      Weight:    109.34 kg (241 lb 0.8 oz)  SpO2: 94% 96% 98% 100%    Wt Readings from Last 3 Encounters:  05/20/15 109.34 kg (241 lb 0.8 oz)  09/13/14 108.41 kg (239 lb)  11/03/13 105.235 kg (232 lb)     Intake/Output Summary (Last 24 hours) at 05/20/15 1245 Last data filed at 05/20/15 0600  Gross per 24 hour  Intake    360 ml  Output    100 ml  Net    260 ml    Exam  General: Well developed, well nourished, NAD, appears stated age  29: NCAT,mucous membranes moist.   Cardiovascular: S1 S2 auscultated, no rubs, murmurs or gallops. Regular rate and rhythm.  Respiratory: Clear to auscultation bilaterally with equal chest rise  Abdomen: Soft, nontender, nondistended, + bowel sounds  Extremities: warm dry without cyanosis clubbing or edema  Neuro: AAOx3, cranial nerves grossly intact. Strength 5/5 in patient's upper and lower extremities bilaterally  Psych: Normal affect and demeanor with intact judgement and insight  Data Review   Micro Results No results found for this or any previous visit (from the past 240 hour(s)).  Radiology Reports Ct Head Wo Contrast  05/19/2015   CLINICAL DATA:  [place]  this morning while using the bathroom, posterior neck and head pain, initial encounter, history hypertension, hyperlipidemia, coronary artery disease, TIA, former smoker  EXAM: CT HEAD WITHOUT CONTRAST  CT CERVICAL SPINE WITHOUT CONTRAST  TECHNIQUE: Multidetector CT imaging of the head and cervical spine was performed following the standard protocol without intravenous contrast. Multiplanar CT image reconstructions of the cervical spine were also generated.  COMPARISON:  None  FINDINGS: CT HEAD FINDINGS  Generalized atrophy.  Normal ventricular morphology.  No midline shift or mass effect.  Small vessel chronic ischemic changes of deep cerebral white matter.  Tiny old lacunar infarct at LEFT basal ganglia and questionably RIGHT thalamus and LEFT pons.  No intracranial hemorrhage, mass lesion, or acute infarction.  No extra-axial fluid collections.  Visualized paranasal sinuses and mastoid air cells clear.  Bones unremarkable.  Atherosclerotic calcifications at the carotid siphons bilaterally.  CT CERVICAL SPINE FINDINGS  Prevertebral soft tissues normal thickness.  Scattered mild facet degenerative changes.  Minimal disc space narrowing and endplate spur formation C4-C5, C5-C6, C6-C7.  Vertebral body heights maintained without fracture or subluxation.  No bone destruction.  Visualized skullbase intact.  Visualized lung apices clear.  IMPRESSION: Atrophy with small vessel chronic ischemic changes of deep cerebral white matter.  Tiny old lacunar infarcts questioned.  No acute intracranial abnormalities.  Multilevel degenerative disc and facet disease changes cervical spine.  No acute cervical spine abnormalities.   Electronically Signed   By: Lavonia Dana M.D.   On: 05/19/2015 08:34   Ct Chest W Contrast  05/19/2015   CLINICAL DATA:  Fall this morning, neck pain, left rib pain  EXAM: CT CHEST, ABDOMEN, AND PELVIS WITH CONTRAST  TECHNIQUE: Multidetector CT imaging of the chest, abdomen and pelvis was performed  following the standard protocol during bolus administration of intravenous contrast.  CONTRAST:  156mL OMNIPAQUE IOHEXOL 300 MG/ML  SOLN  COMPARISON:  None.  FINDINGS: CT CHEST FINDINGS  Sagittal images of the spine shows degenerative changes thoracic spine. Sagittal view of the sternum is unremarkable. No scapular fracture is noted.  Central airways are patent.  Mild atherosclerotic calcifications of thoracic aorta. No mediastinal hematoma or adenopathy. Atherosclerotic calcifications of coronary arteries. Heart size within normal limits. Small hiatal hernia.  No displaced rib fractures are identified. Mild atelectasis noted bilateral lung bases posteriorly.  Axial image 49 there is probable subtle nondisplaced fracture in left lower anterior ribs/left base anteriorly. Probable left sixth rib. Mild adjacent pleural thickening.  Images of the lung parenchyma shows no lung contusion. There is no pneumothorax. There is mild displaced fracture of the left lateral ninth rib see axial image 65.  CT ABDOMEN AND PELVIS FINDINGS  Sagittal images of the spine shows degenerative changes lumbar spine. No acute fractures are noted within lumbar spine. Mild degenerative changes bilateral SI joints. No pelvic fractures are noted.  Enhanced liver shows no laceration. Probable cyst in right hepatic lobe laterally measures 1 cm. The pancreas, spleen and adrenal glands are unremarkable. Kidneys are symmetrical in size and enhancement. A cyst in lower pole of the right kidney measures 1 cm. Cyst in midpole of the left kidney posterior aspect measures 2.5 cm. Atherosclerotic calcifications of abdominal aorta and iliac arteries.  No hydronephrosis or hydroureter. Delayed renal images shows bilateral renal symmetrical excretion. No evidence of renal laceration. No calcified gallstones are noted within gallbladder.  No small bowel obstruction.  No ascites or free air.  No adenopathy.  Sigmoid colon diverticula are noted. No evidence of  acute diverticulitis. There is mild thickening of wall of under distended urinary bladder. Mild cystitis cannot be excluded. Prostate gland calcifications are noted. The prostate gland is not enlarged. Seminal vesicles are unremarkable. Mild degenerative changes pubic symphysis.  Coronal images shows mild degenerative changes bilateral hip joints.  No definite sacral fracture is noted.  No aortic aneurysm.  IMPRESSION: 1. There is subtle nondisplaced fracture of the left anterior sixth rib. Mild displaced fracture  of the left ninth rib. 2. No mediastinal hematoma or adenopathy. 3. No lung contusion or pneumothorax. 4. Small hiatal hernia. 5. No acute visceral injury within abdomen or pelvis. 6. Degenerative changes lumbar spine. 7. Degenerative changes bilateral SI joints and hip joints. Mild degenerative changes pubic symphysis. 8. No pelvic fractures. 9. Sigmoid colon diverticula are noted. No evidence of acute diverticulitis. 10. No small bowel obstruction. 11. Mild thickening of wall of under distended urinary bladder. Mild cystitis cannot be excluded.   Electronically Signed   By: Lahoma Crocker M.D.   On: 05/19/2015 08:41   Ct Cervical Spine Wo Contrast  05/19/2015   CLINICAL DATA:  [place] this morning while using the bathroom, posterior neck and head pain, initial encounter, history hypertension, hyperlipidemia, coronary artery disease, TIA, former smoker  EXAM: CT HEAD WITHOUT CONTRAST  CT CERVICAL SPINE WITHOUT CONTRAST  TECHNIQUE: Multidetector CT imaging of the head and cervical spine was performed following the standard protocol without intravenous contrast. Multiplanar CT image reconstructions of the cervical spine were also generated.  COMPARISON:  None  FINDINGS: CT HEAD FINDINGS  Generalized atrophy.  Normal ventricular morphology.  No midline shift or mass effect.  Small vessel chronic ischemic changes of deep cerebral white matter.  Tiny old lacunar infarct at LEFT basal ganglia and questionably RIGHT  thalamus and LEFT pons.  No intracranial hemorrhage, mass lesion, or acute infarction.  No extra-axial fluid collections.  Visualized paranasal sinuses and mastoid air cells clear.  Bones unremarkable.  Atherosclerotic calcifications at the carotid siphons bilaterally.  CT CERVICAL SPINE FINDINGS  Prevertebral soft tissues normal thickness.  Scattered mild facet degenerative changes.  Minimal disc space narrowing and endplate spur formation C4-C5, C5-C6, C6-C7.  Vertebral body heights maintained without fracture or subluxation.  No bone destruction.  Visualized skullbase intact.  Visualized lung apices clear.  IMPRESSION: Atrophy with small vessel chronic ischemic changes of deep cerebral white matter.  Tiny old lacunar infarcts questioned.  No acute intracranial abnormalities.  Multilevel degenerative disc and facet disease changes cervical spine.  No acute cervical spine abnormalities.   Electronically Signed   By: Lavonia Dana M.D.   On: 05/19/2015 08:34   Mr Brain Wo Contrast  05/19/2015   CLINICAL DATA:  Syncope.  EXAM: MRI HEAD WITHOUT CONTRAST  TECHNIQUE: Multiplanar, multiecho pulse sequences of the brain and surrounding structures were obtained without intravenous contrast.  COMPARISON:  Head CT 05/19/2015 and MRI 06/25/2006  FINDINGS: There is no evidence of acute infarct, intracranial hemorrhage, mass, midline shift, or extra-axial fluid collection. Punctate focus of susceptibility artifact at the inferior right cerebellum corresponds to calcification on CT. There is mild generalized cerebral atrophy. Patchy T2 hyperintensities throughout the subcortical and deep cerebral white matter have mildly progressed from the prior MRI and are nonspecific but compatible with moderate chronic small vessel ischemic disease.  Prior bilateral cataract extraction is noted. Small maxillary sinus mucous retention cysts are present. Mastoid air cells are clear. Major intracranial vascular flow voids are preserved.   IMPRESSION: 1. No acute intracranial abnormality. 2. Moderate chronic small vessel ischemic disease.   Electronically Signed   By: Logan Bores   On: 05/19/2015 14:03   Ct Abdomen Pelvis W Contrast  05/19/2015   CLINICAL DATA:  Fall this morning, neck pain, left rib pain  EXAM: CT CHEST, ABDOMEN, AND PELVIS WITH CONTRAST  TECHNIQUE: Multidetector CT imaging of the chest, abdomen and pelvis was performed following the standard protocol during bolus administration of intravenous contrast.  CONTRAST:  132mL OMNIPAQUE IOHEXOL 300 MG/ML  SOLN  COMPARISON:  None.  FINDINGS: CT CHEST FINDINGS  Sagittal images of the spine shows degenerative changes thoracic spine. Sagittal view of the sternum is unremarkable. No scapular fracture is noted.  Central airways are patent.  Mild atherosclerotic calcifications of thoracic aorta. No mediastinal hematoma or adenopathy. Atherosclerotic calcifications of coronary arteries. Heart size within normal limits. Small hiatal hernia.  No displaced rib fractures are identified. Mild atelectasis noted bilateral lung bases posteriorly.  Axial image 49 there is probable subtle nondisplaced fracture in left lower anterior ribs/left base anteriorly. Probable left sixth rib. Mild adjacent pleural thickening.  Images of the lung parenchyma shows no lung contusion. There is no pneumothorax. There is mild displaced fracture of the left lateral ninth rib see axial image 65.  CT ABDOMEN AND PELVIS FINDINGS  Sagittal images of the spine shows degenerative changes lumbar spine. No acute fractures are noted within lumbar spine. Mild degenerative changes bilateral SI joints. No pelvic fractures are noted.  Enhanced liver shows no laceration. Probable cyst in right hepatic lobe laterally measures 1 cm. The pancreas, spleen and adrenal glands are unremarkable. Kidneys are symmetrical in size and enhancement. A cyst in lower pole of the right kidney measures 1 cm. Cyst in midpole of the left kidney  posterior aspect measures 2.5 cm. Atherosclerotic calcifications of abdominal aorta and iliac arteries.  No hydronephrosis or hydroureter. Delayed renal images shows bilateral renal symmetrical excretion. No evidence of renal laceration. No calcified gallstones are noted within gallbladder.  No small bowel obstruction.  No ascites or free air.  No adenopathy.  Sigmoid colon diverticula are noted. No evidence of acute diverticulitis. There is mild thickening of wall of under distended urinary bladder. Mild cystitis cannot be excluded. Prostate gland calcifications are noted. The prostate gland is not enlarged. Seminal vesicles are unremarkable. Mild degenerative changes pubic symphysis.  Coronal images shows mild degenerative changes bilateral hip joints.  No definite sacral fracture is noted.  No aortic aneurysm.  IMPRESSION: 1. There is subtle nondisplaced fracture of the left anterior sixth rib. Mild displaced fracture of the left ninth rib. 2. No mediastinal hematoma or adenopathy. 3. No lung contusion or pneumothorax. 4. Small hiatal hernia. 5. No acute visceral injury within abdomen or pelvis. 6. Degenerative changes lumbar spine. 7. Degenerative changes bilateral SI joints and hip joints. Mild degenerative changes pubic symphysis. 8. No pelvic fractures. 9. Sigmoid colon diverticula are noted. No evidence of acute diverticulitis. 10. No small bowel obstruction. 11. Mild thickening of wall of under distended urinary bladder. Mild cystitis cannot be excluded.   Electronically Signed   By: Lahoma Crocker M.D.   On: 05/19/2015 08:41   Dg Chest Portable 1 View  05/19/2015   CLINICAL DATA:  Recent fall with left-sided chest pain  EXAM: PORTABLE CHEST - 1 VIEW  COMPARISON:  11/02/2013  FINDINGS: Cardiac shadow is stable. Thoracic aorta is again tortuous with mild calcifications. The lungs are well aerated with very minimal left basilar atelectasis. No bony abnormality is seen. No pneumothorax is noted.  IMPRESSION:  Mild left basilar atelectasis.   Electronically Signed   By: Inez Catalina M.D.   On: 05/19/2015 07:19    CBC  Recent Labs Lab 05/19/15 0630 05/19/15 0638 05/20/15 0329  WBC 7.1  --  7.3  HGB 13.5 14.6 12.4*  HCT 40.6 43.0 37.2*  PLT 135*  --  120*  MCV 94.4  --  95.6  MCH 31.4  --  31.9  MCHC 33.3  --  33.3  RDW 14.1  --  14.4  LYMPHSABS 2.1  --   --   MONOABS 0.6  --   --   EOSABS 0.1  --   --   BASOSABS 0.0  --   --     Chemistries   Recent Labs Lab 05/19/15 0630 05/19/15 0638 05/20/15 0329  NA 136 138 136  K 3.6 3.6 3.9  CL 99* 101 102  CO2 26  --  27  GLUCOSE 119* 123* 97  BUN 22* 25* 19  CREATININE 1.54* 1.50* 1.51*  CALCIUM 9.1  --  8.7*  AST 18  --   --   ALT 16*  --   --   ALKPHOS 73  --   --   BILITOT 0.6  --   --    ------------------------------------------------------------------------------------------------------------------ estimated creatinine clearance is 42 mL/min (by C-G formula based on Cr of 1.51). ------------------------------------------------------------------------------------------------------------------ No results for input(s): HGBA1C in the last 72 hours. ------------------------------------------------------------------------------------------------------------------ No results for input(s): CHOL, HDL, LDLCALC, TRIG, CHOLHDL, LDLDIRECT in the last 72 hours. ------------------------------------------------------------------------------------------------------------------  Recent Labs  05/19/15 1559  TSH 4.839*   ------------------------------------------------------------------------------------------------------------------ No results for input(s): VITAMINB12, FOLATE, FERRITIN, TIBC, IRON, RETICCTPCT in the last 72 hours.  Coagulation profile  Recent Labs Lab 05/19/15 0630 05/20/15 0329  INR 1.99* 1.88*    No results for input(s): DDIMER in the last 72 hours.  Cardiac Enzymes No results for input(s): CKMB,  TROPONINI, MYOGLOBIN in the last 168 hours.  Invalid input(s): CK ------------------------------------------------------------------------------------------------------------------ Invalid input(s): POCBNP    Carnita Golob D.O. on 05/20/2015 at 12:45 PM  Between 7am to 7pm - Pager - [phone]  After 7pm go to [email or web address] - password [email or web address]  And look for the night coverage person covering for me after hours  [facility] Office  [phone]

## 2015-05-20 NOTE — Progress Notes (Signed)
SUBJECTIVE: Feeling well. No episodes of syncope since hospitalization.     Intake/Output Summary (Last 24 hours) at 05/20/15 1317 Last data filed at 05/20/15 0600  Gross per 24 hour  Intake    360 ml  Output    100 ml  Net    260 ml    Current Facility-Administered Medications  Medication Dose Route Frequency Provider Last Rate Last Dose  . acetaminophen (TYLENOL) tablet 650 mg  650 mg Oral Q6H PRN Melton Alar, PA-C       Or  . acetaminophen (TYLENOL) suppository 650 mg  650 mg Rectal Q6H PRN Melton Alar, PA-C      . alum & mag hydroxide-simeth (MAALOX/MYLANTA) 200-200-20 MG/5ML suspension 30 mL  30 mL Oral Q6H PRN Melton Alar, PA-C      . cefTRIAXone (ROCEPHIN) 1 g in dextrose 5 % 50 mL IVPB - Premix  1 g Intravenous Q24H Melton Alar, PA-C   1 g at 05/20/15 1316  . docusate sodium (COLACE) capsule 100 mg  100 mg Oral BID Melton Alar, PA-C   100 mg at 05/20/15 1021  . DULoxetine (CYMBALTA) DR capsule 20 mg  20 mg Oral BID Melton Alar, PA-C   20 mg at 05/20/15 1021  . gabapentin (NEURONTIN) capsule 100 mg  100 mg Oral TID Melton Alar, PA-C   100 mg at 05/20/15 1022  . HYDROcodone-acetaminophen (NORCO/VICODIN) 5-325 MG per tablet 1-2 tablet  1-2 tablet Oral Q4H PRN Melton Alar, PA-C   2 tablet at 05/20/15 0845  . lidocaine (LIDODERM) 5 % 1 patch  1 patch Transdermal Q24H Melton Alar, PA-C   1 patch at 05/20/15 1022  . LORazepam (ATIVAN) injection 1 mg  1 mg Intravenous Once Janece Canterbury, MD   1 mg at 05/19/15 1213  . ondansetron (ZOFRAN) tablet 4 mg  4 mg Oral Q6H PRN Melton Alar, PA-C       Or  . ondansetron [facility]) injection 4 mg  4 mg Intravenous Q6H PRN Melton Alar, PA-C      . primidone (MYSOLINE) tablet 50 mg  50 mg Oral TID Melton Alar, PA-C   50 mg at 05/20/15 1022  . senna-docusate (Senokot-S) tablet 1 tablet  1 tablet Oral QHS PRN Melton Alar, PA-C      . sodium chloride 0.9 % injection 3 mL  3 mL Intravenous  Q12H Marianne L York, PA-C   3 mL at 05/20/15 1023  . tamsulosin (FLOMAX) capsule 0.4 mg  0.4 mg Oral QPC breakfast Melton Alar, PA-C   0.4 mg at 05/20/15 1022  . warfarin (COUMADIN) tablet 5 mg  5 mg Oral ONCE-1800 Maryann Mikhail, DO      . Warfarin - Pharmacist Dosing Inpatient   Does not apply q1800 Janece Canterbury, MD        Filed Vitals:   05/19/15 6834 05/19/15 2208 05/20/15 0251 05/20/15 0543  BP: 109/52 128/64 122/68 125/52  Pulse: 64 68 65 68  Temp: 98.4 F (36.9 C) 98.3 F (36.8 C) 98.2 F (36.8 C) 98.2 F (36.8 C)  TempSrc: Oral Oral Oral Oral  Resp: 18 18 18 18   Height:      Weight:    241 lb 0.8 oz (109.34 kg)  SpO2: 94% 96% 98% 100%    PHYSICAL EXAM General: NAD HEENT: Normal. Neck: No JVD, no thyromegaly.  Lungs: Clear to auscultation bilaterally with normal  respiratory effort. CV: Nondisplaced PMI.  Regular rate and rhythm, normal S1/S2, no S3/S4, no murmur.  No pretibial edema.    Abdomen: Firm, obese. Neurologic: Alert and oriented.  Psych: Normal affect. Musculoskeletal: No gross deformities. Extremities: No clubbing or cyanosis.   TELEMETRY: Reviewed telemetry pt in sinus rhythm with marked 1st degree AV block.  LABS: Basic Metabolic Panel:  Recent Labs  05/19/15 0630 05/19/15 0638 05/20/15 0329  NA 136 138 136  K 3.6 3.6 3.9  CL 99* 101 102  CO2 26  --  27  GLUCOSE 119* 123* 97  BUN 22* 25* 19  CREATININE 1.54* 1.50* 1.51*  CALCIUM 9.1  --  8.7*   Liver Function Tests:  Recent Labs  05/19/15 0630  AST 18  ALT 16*  ALKPHOS 73  BILITOT 0.6  PROT 6.0*  ALBUMIN 3.3*    Recent Labs  05/19/15 0630  LIPASE 48   CBC:  Recent Labs  05/19/15 0630 05/19/15 0638 05/20/15 0329  WBC 7.1  --  7.3  NEUTROABS 4.3  --   --   HGB 13.5 14.6 12.4*  HCT 40.6 43.0 37.2*  MCV 94.4  --  95.6  PLT 135*  --  120*   Cardiac Enzymes: No results for input(s): CKTOTAL, CKMB, CKMBINDEX, TROPONINI in the last 72 hours. BNP: Invalid  input(s): POCBNP D-Dimer: No results for input(s): DDIMER in the last 72 hours. Hemoglobin A1C: No results for input(s): HGBA1C in the last 72 hours. Fasting Lipid Panel: No results for input(s): CHOL, HDL, LDLCALC, TRIG, CHOLHDL, LDLDIRECT in the last 72 hours. Thyroid Function Tests:  Recent Labs  05/19/15 1559  TSH 4.839*   Anemia Panel: No results for input(s): VITAMINB12, FOLATE, FERRITIN, TIBC, IRON, RETICCTPCT in the last 72 hours.  RADIOLOGY: Ct Head Wo Contrast  05/19/2015   CLINICAL DATA:  [place] this morning while using the bathroom, posterior neck and head pain, initial encounter, history hypertension, hyperlipidemia, coronary artery disease, TIA, former smoker  EXAM: CT HEAD WITHOUT CONTRAST  CT CERVICAL SPINE WITHOUT CONTRAST  TECHNIQUE: Multidetector CT imaging of the head and cervical spine was performed following the standard protocol without intravenous contrast. Multiplanar CT image reconstructions of the cervical spine were also generated.  COMPARISON:  None  FINDINGS: CT HEAD FINDINGS  Generalized atrophy.  Normal ventricular morphology.  No midline shift or mass effect.  Small vessel chronic ischemic changes of deep cerebral white matter.  Tiny old lacunar infarct at LEFT basal ganglia and questionably RIGHT thalamus and LEFT pons.  No intracranial hemorrhage, mass lesion, or acute infarction.  No extra-axial fluid collections.  Visualized paranasal sinuses and mastoid air cells clear.  Bones unremarkable.  Atherosclerotic calcifications at the carotid siphons bilaterally.  CT CERVICAL SPINE FINDINGS  Prevertebral soft tissues normal thickness.  Scattered mild facet degenerative changes.  Minimal disc space narrowing and endplate spur formation C4-C5, C5-C6, C6-C7.  Vertebral body heights maintained without fracture or subluxation.  No bone destruction.  Visualized skullbase intact.  Visualized lung apices clear.  IMPRESSION: Atrophy with small vessel chronic ischemic changes  of deep cerebral white matter.  Tiny old lacunar infarcts questioned.  No acute intracranial abnormalities.  Multilevel degenerative disc and facet disease changes cervical spine.  No acute cervical spine abnormalities.   Electronically Signed   By: Lavonia Dana M.D.   On: 05/19/2015 08:34   Ct Chest W Contrast  05/19/2015   CLINICAL DATA:  Fall this morning, neck pain, left rib pain  EXAM: CT  CHEST, ABDOMEN, AND PELVIS WITH CONTRAST  TECHNIQUE: Multidetector CT imaging of the chest, abdomen and pelvis was performed following the standard protocol during bolus administration of intravenous contrast.  CONTRAST:  137mL OMNIPAQUE IOHEXOL 300 MG/ML  SOLN  COMPARISON:  None.  FINDINGS: CT CHEST FINDINGS  Sagittal images of the spine shows degenerative changes thoracic spine. Sagittal view of the sternum is unremarkable. No scapular fracture is noted.  Central airways are patent.  Mild atherosclerotic calcifications of thoracic aorta. No mediastinal hematoma or adenopathy. Atherosclerotic calcifications of coronary arteries. Heart size within normal limits. Small hiatal hernia.  No displaced rib fractures are identified. Mild atelectasis noted bilateral lung bases posteriorly.  Axial image 49 there is probable subtle nondisplaced fracture in left lower anterior ribs/left base anteriorly. Probable left sixth rib. Mild adjacent pleural thickening.  Images of the lung parenchyma shows no lung contusion. There is no pneumothorax. There is mild displaced fracture of the left lateral ninth rib see axial image 65.  CT ABDOMEN AND PELVIS FINDINGS  Sagittal images of the spine shows degenerative changes lumbar spine. No acute fractures are noted within lumbar spine. Mild degenerative changes bilateral SI joints. No pelvic fractures are noted.  Enhanced liver shows no laceration. Probable cyst in right hepatic lobe laterally measures 1 cm. The pancreas, spleen and adrenal glands are unremarkable. Kidneys are symmetrical in size  and enhancement. A cyst in lower pole of the right kidney measures 1 cm. Cyst in midpole of the left kidney posterior aspect measures 2.5 cm. Atherosclerotic calcifications of abdominal aorta and iliac arteries.  No hydronephrosis or hydroureter. Delayed renal images shows bilateral renal symmetrical excretion. No evidence of renal laceration. No calcified gallstones are noted within gallbladder.  No small bowel obstruction.  No ascites or free air.  No adenopathy.  Sigmoid colon diverticula are noted. No evidence of acute diverticulitis. There is mild thickening of wall of under distended urinary bladder. Mild cystitis cannot be excluded. Prostate gland calcifications are noted. The prostate gland is not enlarged. Seminal vesicles are unremarkable. Mild degenerative changes pubic symphysis.  Coronal images shows mild degenerative changes bilateral hip joints.  No definite sacral fracture is noted.  No aortic aneurysm.  IMPRESSION: 1. There is subtle nondisplaced fracture of the left anterior sixth rib. Mild displaced fracture of the left ninth rib. 2. No mediastinal hematoma or adenopathy. 3. No lung contusion or pneumothorax. 4. Small hiatal hernia. 5. No acute visceral injury within abdomen or pelvis. 6. Degenerative changes lumbar spine. 7. Degenerative changes bilateral SI joints and hip joints. Mild degenerative changes pubic symphysis. 8. No pelvic fractures. 9. Sigmoid colon diverticula are noted. No evidence of acute diverticulitis. 10. No small bowel obstruction. 11. Mild thickening of wall of under distended urinary bladder. Mild cystitis cannot be excluded.   Electronically Signed   By: Lahoma Crocker M.D.   On: 05/19/2015 08:41   Ct Cervical Spine Wo Contrast  05/19/2015   CLINICAL DATA:  [place] this morning while using the bathroom, posterior neck and head pain, initial encounter, history hypertension, hyperlipidemia, coronary artery disease, TIA, former smoker  EXAM: CT HEAD WITHOUT CONTRAST  CT CERVICAL  SPINE WITHOUT CONTRAST  TECHNIQUE: Multidetector CT imaging of the head and cervical spine was performed following the standard protocol without intravenous contrast. Multiplanar CT image reconstructions of the cervical spine were also generated.  COMPARISON:  None  FINDINGS: CT HEAD FINDINGS  Generalized atrophy.  Normal ventricular morphology.  No midline shift or mass effect.  Small vessel  chronic ischemic changes of deep cerebral white matter.  Tiny old lacunar infarct at LEFT basal ganglia and questionably RIGHT thalamus and LEFT pons.  No intracranial hemorrhage, mass lesion, or acute infarction.  No extra-axial fluid collections.  Visualized paranasal sinuses and mastoid air cells clear.  Bones unremarkable.  Atherosclerotic calcifications at the carotid siphons bilaterally.  CT CERVICAL SPINE FINDINGS  Prevertebral soft tissues normal thickness.  Scattered mild facet degenerative changes.  Minimal disc space narrowing and endplate spur formation C4-C5, C5-C6, C6-C7.  Vertebral body heights maintained without fracture or subluxation.  No bone destruction.  Visualized skullbase intact.  Visualized lung apices clear.  IMPRESSION: Atrophy with small vessel chronic ischemic changes of deep cerebral white matter.  Tiny old lacunar infarcts questioned.  No acute intracranial abnormalities.  Multilevel degenerative disc and facet disease changes cervical spine.  No acute cervical spine abnormalities.   Electronically Signed   By: Lavonia Dana M.D.   On: 05/19/2015 08:34   Mr Brain Wo Contrast  05/19/2015   CLINICAL DATA:  Syncope.  EXAM: MRI HEAD WITHOUT CONTRAST  TECHNIQUE: Multiplanar, multiecho pulse sequences of the brain and surrounding structures were obtained without intravenous contrast.  COMPARISON:  Head CT 05/19/2015 and MRI 06/25/2006  FINDINGS: There is no evidence of acute infarct, intracranial hemorrhage, mass, midline shift, or extra-axial fluid collection. Punctate focus of susceptibility artifact  at the inferior right cerebellum corresponds to calcification on CT. There is mild generalized cerebral atrophy. Patchy T2 hyperintensities throughout the subcortical and deep cerebral white matter have mildly progressed from the prior MRI and are nonspecific but compatible with moderate chronic small vessel ischemic disease.  Prior bilateral cataract extraction is noted. Small maxillary sinus mucous retention cysts are present. Mastoid air cells are clear. Major intracranial vascular flow voids are preserved.  IMPRESSION: 1. No acute intracranial abnormality. 2. Moderate chronic small vessel ischemic disease.   Electronically Signed   By: Logan Bores   On: 05/19/2015 14:03   Ct Abdomen Pelvis W Contrast  05/19/2015   CLINICAL DATA:  Fall this morning, neck pain, left rib pain  EXAM: CT CHEST, ABDOMEN, AND PELVIS WITH CONTRAST  TECHNIQUE: Multidetector CT imaging of the chest, abdomen and pelvis was performed following the standard protocol during bolus administration of intravenous contrast.  CONTRAST:  151mL OMNIPAQUE IOHEXOL 300 MG/ML  SOLN  COMPARISON:  None.  FINDINGS: CT CHEST FINDINGS  Sagittal images of the spine shows degenerative changes thoracic spine. Sagittal view of the sternum is unremarkable. No scapular fracture is noted.  Central airways are patent.  Mild atherosclerotic calcifications of thoracic aorta. No mediastinal hematoma or adenopathy. Atherosclerotic calcifications of coronary arteries. Heart size within normal limits. Small hiatal hernia.  No displaced rib fractures are identified. Mild atelectasis noted bilateral lung bases posteriorly.  Axial image 49 there is probable subtle nondisplaced fracture in left lower anterior ribs/left base anteriorly. Probable left sixth rib. Mild adjacent pleural thickening.  Images of the lung parenchyma shows no lung contusion. There is no pneumothorax. There is mild displaced fracture of the left lateral ninth rib see axial image 65.  CT ABDOMEN AND  PELVIS FINDINGS  Sagittal images of the spine shows degenerative changes lumbar spine. No acute fractures are noted within lumbar spine. Mild degenerative changes bilateral SI joints. No pelvic fractures are noted.  Enhanced liver shows no laceration. Probable cyst in right hepatic lobe laterally measures 1 cm. The pancreas, spleen and adrenal glands are unremarkable. Kidneys are symmetrical in size and enhancement.  A cyst in lower pole of the right kidney measures 1 cm. Cyst in midpole of the left kidney posterior aspect measures 2.5 cm. Atherosclerotic calcifications of abdominal aorta and iliac arteries.  No hydronephrosis or hydroureter. Delayed renal images shows bilateral renal symmetrical excretion. No evidence of renal laceration. No calcified gallstones are noted within gallbladder.  No small bowel obstruction.  No ascites or free air.  No adenopathy.  Sigmoid colon diverticula are noted. No evidence of acute diverticulitis. There is mild thickening of wall of under distended urinary bladder. Mild cystitis cannot be excluded. Prostate gland calcifications are noted. The prostate gland is not enlarged. Seminal vesicles are unremarkable. Mild degenerative changes pubic symphysis.  Coronal images shows mild degenerative changes bilateral hip joints.  No definite sacral fracture is noted.  No aortic aneurysm.  IMPRESSION: 1. There is subtle nondisplaced fracture of the left anterior sixth rib. Mild displaced fracture of the left ninth rib. 2. No mediastinal hematoma or adenopathy. 3. No lung contusion or pneumothorax. 4. Small hiatal hernia. 5. No acute visceral injury within abdomen or pelvis. 6. Degenerative changes lumbar spine. 7. Degenerative changes bilateral SI joints and hip joints. Mild degenerative changes pubic symphysis. 8. No pelvic fractures. 9. Sigmoid colon diverticula are noted. No evidence of acute diverticulitis. 10. No small bowel obstruction. 11. Mild thickening of wall of under distended  urinary bladder. Mild cystitis cannot be excluded.   Electronically Signed   By: Lahoma Crocker M.D.   On: 05/19/2015 08:41   Dg Chest Portable 1 View  05/19/2015   CLINICAL DATA:  Recent fall with left-sided chest pain  EXAM: PORTABLE CHEST - 1 VIEW  COMPARISON:  11/02/2013  FINDINGS: Cardiac shadow is stable. Thoracic aorta is again tortuous with mild calcifications. The lungs are well aerated with very minimal left basilar atelectasis. No bony abnormality is seen. No pneumothorax is noted.  IMPRESSION: Mild left basilar atelectasis.   Electronically Signed   By: Inez Catalina M.D.   On: 05/19/2015 07:19      ASSESSMENT AND PLAN: 1. Syncope: Probably micturition-induced (vasovagal). Has known intermittent 2nd degree Mobitz II AV block when he had been on beta blocker. Does have marked 1st degree AV block (PR approximately 400 ms). Will plan for 30-day event monitor and our office will call patient to have arranged (I have discussed with Melina Copa PA-C who will assist with this). Will need close f/u in EP clinic. Will likely require pacemaker in the future.  Avoid all AV nodal blocking agents.  2. Paroxysmal atrial fibrillation: Continue warfarin. INR 1.88.  3. Chronic diastolic HF: Stable, euvolemic. On Diovan-HCTZ at home.  4. Essential HTN: Normal.  Dispo: Awaiting PT/OT eval prior to discharge. Discussed with Dr. Ree Kida.   Kate Sable, M.D., F.A.C.C.

## 2015-05-20 NOTE — Progress Notes (Signed)
I have left a message with office schedulers requesting a follow-up appointment and 30 day event monitor per Dr. Court Joy recommendation, and our office will call the patient with this information.  Zenon Leaf PA-C

## 2015-05-20 NOTE — Progress Notes (Signed)
ANTICOAGULATION CONSULT NOTE - Initial Consult  Pharmacy Consult for coumadin Indication: atrial fibrillation  Allergies  Allergen Reactions  . Sulfa Antibiotics Other (See Comments)    Weakness, dizziness    Patient Measurements: Height: 5\' 9"  (175.3 cm) Weight: 241 lb 0.8 oz (109.34 kg) IBW/kg (Calculated) : 70.7   Vital Signs: Temp: 98.2 F (36.8 C) (05/21 0543) Temp Source: Oral (05/21 0543) BP: 125/52 mmHg (05/21 0543) Pulse Rate: 68 (05/21 0543)  Labs:  Recent Labs  05/19/15 0630 05/19/15 3646 05/20/15 0329  HGB 13.5 14.6 12.4*  HCT 40.6 43.0 37.2*  PLT 135*  --  120*  LABPROT 22.5*  --  21.5*  INR 1.99*  --  1.88*  CREATININE 1.54* 1.50* 1.51*    Estimated Creatinine Clearance: 42 mL/min (by C-G formula based on Cr of 1.51).   Medical History: Past Medical History  Diagnosis Date  . Hypertension   . Hyperlipemia   . Coronary artery disease   . TIA (transient ischemic attack)   . Hernia   . GERD (gastroesophageal reflux disease)   . Macular degeneration   . Paroxysmal atrial fibrillation 10/28/2013  . Tachycardia-bradycardia syndrome 09/13/2014    Medications:  Home coumadin dose 3.75mg  Tu and Sat and 5 mg other days, last dose 5/19  Assessment: 81 yo man to continue coumadin for afib.  INR is still subtherapeutic this AM at 1.88. Going to use the higher dose today.   Goal of Therapy:  INR 2-3 Monitor platelets by anticoagulation protocol: Yes   Plan:   Coumadin 5 mg po today Daily PT/INR  Onnie Boer, PharmD Pager: [phone] 05/20/2015 8:31 AM

## 2015-05-20 NOTE — Progress Notes (Signed)
Pt informed PT staff that he has an infestation of bedbugs at home. First treatment ineffective- second treatment sched for this Friday. Pt has not been treated. Placed on contact precautions

## 2015-05-20 NOTE — Evaluation (Signed)
Physical Therapy Evaluation Patient Details Name: Douglas Walker MRN: [id number] DOB: [birth year] Today's Date: 05/20/2015   History of Present Illness  Pt is an 81 y/o male with a PMH of paroxysmal a-fib, AV block, CAD, TIA, HTN, who presents to the [facility] after a syncopal episode at home. In the ED, pt was found to have 2 broken ribs.  Clinical Impression  Pt admitted with above diagnosis. Pt currently with functional limitations due to the deficits listed below (see PT Problem List). At the time of PT eval pt was able to perform transfers and ambulation with occasional assist due to pain. Anticipate that pt will progress well as pain is controlled. Pt will benefit from skilled PT to increase their independence and safety with mobility to allow discharge to the venue listed below.       Follow Up Recommendations Home health PT    Equipment Recommendations  None recommended by PT    Recommendations for Other Services       Precautions / Restrictions Precautions Precautions: Fall Restrictions Weight Bearing Restrictions: No      Mobility  Bed Mobility Overal bed mobility: Needs Assistance Bed Mobility: Supine to Sit     Supine to sit: Supervision     General bed mobility comments: Increased time and supervision for safety.   Transfers Overall transfer level: Needs assistance Equipment used: Rolling walker (2 wheeled) Transfers: Sit to/from Stand Sit to Stand: Min assist         General transfer comment: Assist to power-up to full stand due to pain in ribs  Ambulation/Gait Ambulation/Gait assistance: Min guard Ambulation Distance (Feet): 100 Feet Assistive device: Rolling walker (2 wheeled) Gait Pattern/deviations: Step-through pattern;Decreased stride length;Trunk flexed Gait velocity: Decreased Gait velocity interpretation: Below normal speed for age/gender General Gait Details: Pt with noted intentional tremor in the R hand during walker use.   Stairs            Wheelchair Mobility    Modified Rankin (Stroke Patients Only)       Balance Overall balance assessment: Needs assistance Sitting-balance support: Feet supported;No upper extremity supported Sitting balance-Leahy Scale: Good     Standing balance support: Bilateral upper extremity supported Standing balance-Leahy Scale: Poor                               Pertinent Vitals/Pain Pain Assessment: 0-10 Pain Score: 8  Pain Location: Fx ribs Pain Descriptors / Indicators: Stabbing Pain Intervention(s): Limited activity within patient's tolerance;Monitored during session;Repositioned    Home Living Family/patient expects to be discharged to:: Private residence Living Arrangements: Alone (With son for a couple more weeks) Available Help at Discharge: Family;Available PRN/intermittently Type of Home: House Home Access: Level entry     Home Layout: Two level;Laundry or work area in [place]: Environmental consultant - 2 wheels;Cane - single point      Prior Function Level of Independence: Independent with assistive device(s)               Hand Dominance   Dominant Hand: Right    Extremity/Trunk Assessment   Upper Extremity Assessment: Defer to OT evaluation           Lower Extremity Assessment: Overall WFL for tasks assessed      Cervical / Trunk Assessment: Other exceptions  Communication   Communication: No difficulties  Cognition Arousal/Alertness: Awake/alert Behavior During Therapy: WFL for tasks assessed/performed Overall Cognitive Status: Within Functional Limits  for tasks assessed                      General Comments      Exercises        Assessment/Plan    PT Assessment Patient needs continued PT services  PT Diagnosis Difficulty walking;Acute pain   PT Problem List Decreased strength;Decreased range of motion;Decreased activity tolerance;Decreased balance;Decreased mobility;Decreased knowledge of use of  DME;Decreased safety awareness;Decreased knowledge of precautions;Pain  PT Treatment Interventions DME instruction;Gait training;Stair training;Functional mobility training;Therapeutic exercise;Therapeutic activities;Neuromuscular re-education;Patient/family education   PT Goals (Current goals can be found in the Care Plan section) Acute Rehab PT Goals Patient Stated Goal: Decrease his pain, get back to his home PT Goal Formulation: With patient Time For Goal Achievement: 05/27/15 Potential to Achieve Goals: Good    Frequency Min 3X/week   Barriers to discharge Decreased caregiver support Pt will eventually be going back to his home alone in the next few weeks    Co-evaluation               End of Session Equipment Utilized During Treatment: Gait belt Activity Tolerance: Patient tolerated treatment well Patient left: in chair;with call bell/phone within reach Nurse Communication: Mobility status         Time: 0272-5366 PT Time Calculation (min) (ACUTE ONLY): 36 min   Charges:   PT Evaluation $Initial PT Evaluation Tier I: 1 Procedure PT Treatments $Gait Training: 8-22 mins   PT G Codes:        Rolinda Roan 06-11-15, 1:33 PM   Rolinda Roan, PT, DPT Acute Rehabilitation Services Pager: [phone]

## 2015-05-21 DIAGNOSIS — W19XXXA Unspecified fall, initial encounter: Secondary | ICD-10-CM

## 2015-05-21 DIAGNOSIS — I9589 Other hypotension: Secondary | ICD-10-CM

## 2015-05-21 DIAGNOSIS — E860 Dehydration: Secondary | ICD-10-CM

## 2015-05-21 LAB — URINE CULTURE: Colony Count: >=100000

## 2015-05-21 LAB — PROTIME-INR
INR: 2.01 — ABNORMAL HIGH (ref 0.00–1.49)
PROTHROMBIN TIME: 22.6 s — AB (ref 11.6–15.2)

## 2015-05-21 LAB — GLUCOSE, CAPILLARY: GLUCOSE-CAPILLARY: 101 mg/dL — AB (ref 65–99)

## 2015-05-21 MED ORDER — FOSFOMYCIN TROMETHAMINE 3 G PO PACK
3.0000 g | PACK | Freq: Once | ORAL | Status: DC
  Filled 2015-05-21: qty 3

## 2015-05-21 MED ORDER — WARFARIN SODIUM 5 MG PO TABS
5.0000 mg | ORAL_TABLET | ORAL | Status: DC
  Filled 2015-05-21: qty 1

## 2015-05-21 MED ORDER — FOSFOMYCIN TROMETHAMINE 3 G PO PACK
3.0000 g | PACK | Freq: Once | ORAL | Status: AC
  Administered 2015-05-21: 3 g via ORAL
  Filled 2015-05-21 (×2): qty 3

## 2015-05-21 MED ORDER — HYDROCODONE-ACETAMINOPHEN 5-325 MG PO TABS: 1.0000 | ORAL_TABLET | ORAL | Status: DC | PRN

## 2015-05-21 MED ORDER — WARFARIN SODIUM 2.5 MG PO TABS: 3.7500 mg | ORAL_TABLET | ORAL | Status: DC

## 2015-05-21 MED ORDER — VALSARTAN-HYDROCHLOROTHIAZIDE 160-25 MG PO TABS: 0.5000 | ORAL_TABLET | Freq: Every day | ORAL | Status: DC

## 2015-05-21 NOTE — Progress Notes (Signed)
ANTICOAGULATION CONSULT NOTE - Initial Consult  Pharmacy Consult for coumadin Indication: atrial fibrillation  Allergies  Allergen Reactions  . Sulfa Antibiotics Other (See Comments)    Weakness, dizziness    Patient Measurements: Height: 5\' 9"  (175.3 cm) Weight: 244 lb 4.3 oz (110.8 kg) (bedscale ) IBW/kg (Calculated) : 70.7   Vital Signs: Temp: 98.1 F (36.7 C) (05/22 0525) Temp Source: Oral (05/22 0525) BP: 108/43 mmHg (05/22 0525) Pulse Rate: 62 (05/22 0525)  Labs:  Recent Labs  05/19/15 0630 05/19/15 3354 05/20/15 0329 05/21/15 0548  HGB 13.5 14.6 12.4*  --   HCT 40.6 43.0 37.2*  --   PLT 135*  --  120*  --   LABPROT 22.5*  --  21.5* 22.6*  INR 1.99*  --  1.88* 2.01*  CREATININE 1.54* 1.50* 1.51*  --     Estimated Creatinine Clearance: 42.3 mL/min (by C-G formula based on Cr of 1.51).   Medical History: Past Medical History  Diagnosis Date  . Hypertension   . Hyperlipemia   . Coronary artery disease   . TIA (transient ischemic attack)   . Hernia   . GERD (gastroesophageal reflux disease)   . Macular degeneration   . Paroxysmal atrial fibrillation 10/28/2013  . Tachycardia-bradycardia syndrome 09/13/2014    Medications:  Home coumadin dose 3.75mg  Tu and Sat and 5 mg other days, last dose 5/19  Assessment: 81 yo man to continue coumadin for afib.  INR is therapeutic today. Possible home today. Prob will need to increase home dose slightly.   Goal of Therapy:  INR 2-3 Monitor platelets by anticoagulation protocol: Yes   Plan:   Coumadin 5 mg po qday except 3.75mg  Tuesday Daily PT/INR  Onnie Boer, PharmD Pager: [phone] 05/21/2015 9:18 AM

## 2015-05-21 NOTE — Care Management Note (Signed)
Case Management Note  Patient Details  Name: Douglas Walker MRN: [id number] Date of Birth: September 11, 1925  Subjective/Objective:                    Action/Plan: CM spoke with son of pt, [name], [address] [place], [place] where pt will be recuperating.  Referral called to [facility]  With appropriate address and contact information.  No other CM needs were communicated.   Expected Discharge Date:                  Expected Discharge Plan:  [place]  In-House Referral:     Discharge planning Services  CM Consult  Post Acute Care Choice:  Home Health Choice offered to:  Adult Children  DME Arranged:    DME Agency:     HH Arranged:  RN, PT [place] Agency:  [facility]  Status of Service:  Completed, signed off  Medicare Important Message Given:    Date Medicare IM Given:    Medicare IM give by:    Date Additional Medicare IM Given:    Additional Medicare Important Message give by:     If discussed at [place] of Stay Meetings, dates discussed:    Additional Comments:  Dellie Catholic, RN 05/21/2015, 3:52 PM

## 2015-05-21 NOTE — Discharge Summary (Addendum)
Physician Discharge Summary  Douglas Walker OIN:[id number] DOB: September 19, 1925 DOA: 05/19/2015  PCP: Marjorie Smolder, MD  Admit date: 05/19/2015 Discharge date: 05/21/2015  Time spent: 45 minutes  Recommendations for Outpatient Follow-up:  Patient will be discharged to home with home health services, PT and RN.  Patient will need to follow up with primary care provider within one week of discharge.  Patient should continue medications as prescribed.  Patient should follow a heart healthy diet.  Patient will need to follow up with cardiology, regarding event monitor.   Discharge Diagnoses:  Syncope Rib fractures (6th and 9th ribs on the left side) Urinary tract infection Hypotension. Normally hypertensive Paroxysmal atrial fibrillation/history of Mobitz type II AV block and tachybradycardia syndrome Tremor Neuropathy  Discharge Condition: Stable  Diet recommendation: heart healthy diet  Filed Weights   05/19/15 1123 05/20/15 0543 05/21/15 0525  Weight: 110.8 kg (244 lb 4.3 oz) 109.34 kg (241 lb 0.8 oz) 110.8 kg (244 lb 4.3 oz)    History of present illness:  on 05/19/2015 by Ms. Imogene Burn, [place] Douglas Walker is a 81 y.o. male, with a past medical history of paroxysmal atrial fibrillation (on Coumadin), Mobitz type II AV block, coronary artery disease, TIA, and hypertension who presents from home with his son after a syncopal episode last night. The patient reports that he went to bed about 11 PM he got up 2 times during the night to urinate. He got up a third time and proximally 5 AM to go to the bathroom. He doesn't remember anything after that. He had no dizziness or double vision, and no headache prior to losing consciousness. His son states that he heard his father fall and got up to go check on him. He found him sleeping/snoring on the floor with blood on his face and head. The patient denies any dizziness or headaches, recent illness, difficulty urinating or dysuria. He  denies any palpitations, chest pain, difficulty breathing. There is no history of sudden cardiac death in his family. In the emergency department the patient was found to have 2 broken ribs. He has been relatively hypotensive and hypoxic (88%). Hgb is stable. Creatinine is elevated at 1.5 but it appears that this is below his baseline. U/A is positive for infection.  Hospital Course:  Syncope -Unclear etiology, possibly vasovagal with maturation versus AV block -Echo: EF of 55-60%, no regional wall abnormality, mild AR -Cardiology consultation appreciated as patient had questionable second-degree Mobitz type II AV block -Cardiology recommended 30 day event monitor- will be arranged for by cardiology.  Patient will also need close follow up with EP clinic for possible PM in the future.  Rib fractures (6th and 9th ribs on the left side) -Secondary to ground-level fall -Continue incentive spirometry -PT recommended home health -Continue pain management- patient give script for hydrocodone  Urinary tract infection -History of BPH. UA positive -Culture >100K Enterococcus, specificities pending -Initially placed on rocephin -Has history of ESBL Ecoli -One dose of fosfomycin  Hypotension. Normally hypertensive -Possibly due to dehydration and infection -Appears to have resolved  -Valsartan HCTZ held - but may continue upon discharge, however, decreased dose  Paroxysmal atrial fibrillation/history of Mobitz type II AV block and tachybradycardia syndrome -Patient is on Coumadin, continue home dose -INR subtherapeutic at 2.01  Tremor -Continue primidone  Neuropathy -Continue gabapentin  Code Status: DNR  Procedures  None  Consults  Cardiology Discharge Exam: Filed Vitals:   05/21/15 0525  BP: 108/43  Pulse: 62  Temp: 98.1 F (  36.7 C)  Resp: 18    General: Well developed, well nourished, No distress  HEENT: NCAT,mucous membranes moist.   Cardiovascular: S1 S2  auscultated, no murmurs, RRR  Respiratory: Clear to auscultation   Abdomen: Soft, nontender, nondistended, + bowel sounds  Extremities: warm dry without cyanosis clubbing or edema  Neuro: AAOx3, nonfocal  Psych: Normal affect and demeanor  Discharge Instructions      Discharge Instructions    Discharge instructions    Complete by:  As directed   Patient will be discharged to home with home health services, PT and RN.  Patient will need to follow up with primary care provider within one week of discharge.  Patient should continue medications as prescribed.  Patient should follow a heart healthy diet.            Medication List    TAKE these medications        beta carotene w/minerals tablet  Take 2 tablets by mouth once.     DULoxetine 20 MG capsule  Commonly known as:  CYMBALTA  Take 20 mg by mouth 2 (two) times daily.     gabapentin 100 MG capsule  Commonly known as:  NEURONTIN  Take 100 mg by mouth 3 (three) times daily.     HYDROcodone-acetaminophen 5-325 MG per tablet  Commonly known as:  NORCO/VICODIN  Take 1-2 tablets by mouth every 4 (four) hours as needed for moderate pain.     primidone 50 MG tablet  Commonly known as:  MYSOLINE  Take 50 mg by mouth 3 (three) times daily.     tamsulosin 0.4 MG Caps capsule  Commonly known as:  FLOMAX  Take 0.4 mg by mouth daily after breakfast.     valsartan-hydrochlorothiazide 160-25 MG per tablet  Commonly known as:  DIOVAN-HCT  Take 0.5 tablets by mouth daily.     warfarin 2.5 MG tablet  Commonly known as:  COUMADIN  Take 3.75-5 mg by mouth See admin instructions. Take 1 and 1/2 tablets on Tuesday and Saturday then take 2 tablets the other days       Allergies  Allergen Reactions  . Sulfa Antibiotics Other (See Comments)    Weakness, dizziness   Follow-up Information    Follow up with Sinclair Grooms, MD.   Specialty:  Cardiology   Why:  Office will call you for your followup appointment and to set up  your heart monitor. Call office if you have not heard back in 3 days.   Contact information:   [address] [place] [address] [phone]        The results of significant diagnostics from this hospitalization (including imaging, microbiology, ancillary and laboratory) are listed below for reference.    Significant Diagnostic Studies: Ct Head Wo Contrast  05/19/2015   CLINICAL DATA:  [place] this morning while using the bathroom, posterior neck and head pain, initial encounter, history hypertension, hyperlipidemia, coronary artery disease, TIA, former smoker  EXAM: CT HEAD WITHOUT CONTRAST  CT CERVICAL SPINE WITHOUT CONTRAST  TECHNIQUE: Multidetector CT imaging of the head and cervical spine was performed following the standard protocol without intravenous contrast. Multiplanar CT image reconstructions of the cervical spine were also generated.  COMPARISON:  None  FINDINGS: CT HEAD FINDINGS  Generalized atrophy.  Normal ventricular morphology.  No midline shift or mass effect.  Small vessel chronic ischemic changes of deep cerebral white matter.  Tiny old lacunar infarct at LEFT basal ganglia and questionably RIGHT thalamus  and LEFT pons.  No intracranial hemorrhage, mass lesion, or acute infarction.  No extra-axial fluid collections.  Visualized paranasal sinuses and mastoid air cells clear.  Bones unremarkable.  Atherosclerotic calcifications at the carotid siphons bilaterally.  CT CERVICAL SPINE FINDINGS  Prevertebral soft tissues normal thickness.  Scattered mild facet degenerative changes.  Minimal disc space narrowing and endplate spur formation C4-C5, C5-C6, C6-C7.  Vertebral body heights maintained without fracture or subluxation.  No bone destruction.  Visualized skullbase intact.  Visualized lung apices clear.  IMPRESSION: Atrophy with small vessel chronic ischemic changes of deep cerebral white matter.  Tiny old lacunar infarcts questioned.  No acute intracranial  abnormalities.  Multilevel degenerative disc and facet disease changes cervical spine.  No acute cervical spine abnormalities.   Electronically Signed   By: Lavonia Dana M.D.   On: 05/19/2015 08:34   Ct Chest W Contrast  05/19/2015   CLINICAL DATA:  Fall this morning, neck pain, left rib pain  EXAM: CT CHEST, ABDOMEN, AND PELVIS WITH CONTRAST  TECHNIQUE: Multidetector CT imaging of the chest, abdomen and pelvis was performed following the standard protocol during bolus administration of intravenous contrast.  CONTRAST:  169mL OMNIPAQUE IOHEXOL 300 MG/ML  SOLN  COMPARISON:  None.  FINDINGS: CT CHEST FINDINGS  Sagittal images of the spine shows degenerative changes thoracic spine. Sagittal view of the sternum is unremarkable. No scapular fracture is noted.  Central airways are patent.  Mild atherosclerotic calcifications of thoracic aorta. No mediastinal hematoma or adenopathy. Atherosclerotic calcifications of coronary arteries. Heart size within normal limits. Small hiatal hernia.  No displaced rib fractures are identified. Mild atelectasis noted bilateral lung bases posteriorly.  Axial image 49 there is probable subtle nondisplaced fracture in left lower anterior ribs/left base anteriorly. Probable left sixth rib. Mild adjacent pleural thickening.  Images of the lung parenchyma shows no lung contusion. There is no pneumothorax. There is mild displaced fracture of the left lateral ninth rib see axial image 65.  CT ABDOMEN AND PELVIS FINDINGS  Sagittal images of the spine shows degenerative changes lumbar spine. No acute fractures are noted within lumbar spine. Mild degenerative changes bilateral SI joints. No pelvic fractures are noted.  Enhanced liver shows no laceration. Probable cyst in right hepatic lobe laterally measures 1 cm. The pancreas, spleen and adrenal glands are unremarkable. Kidneys are symmetrical in size and enhancement. A cyst in lower pole of the right kidney measures 1 cm. Cyst in midpole of  the left kidney posterior aspect measures 2.5 cm. Atherosclerotic calcifications of abdominal aorta and iliac arteries.  No hydronephrosis or hydroureter. Delayed renal images shows bilateral renal symmetrical excretion. No evidence of renal laceration. No calcified gallstones are noted within gallbladder.  No small bowel obstruction.  No ascites or free air.  No adenopathy.  Sigmoid colon diverticula are noted. No evidence of acute diverticulitis. There is mild thickening of wall of under distended urinary bladder. Mild cystitis cannot be excluded. Prostate gland calcifications are noted. The prostate gland is not enlarged. Seminal vesicles are unremarkable. Mild degenerative changes pubic symphysis.  Coronal images shows mild degenerative changes bilateral hip joints.  No definite sacral fracture is noted.  No aortic aneurysm.  IMPRESSION: 1. There is subtle nondisplaced fracture of the left anterior sixth rib. Mild displaced fracture of the left ninth rib. 2. No mediastinal hematoma or adenopathy. 3. No lung contusion or pneumothorax. 4. Small hiatal hernia. 5. No acute visceral injury within abdomen or pelvis. 6. Degenerative changes lumbar spine. 7. Degenerative  changes bilateral SI joints and hip joints. Mild degenerative changes pubic symphysis. 8. No pelvic fractures. 9. Sigmoid colon diverticula are noted. No evidence of acute diverticulitis. 10. No small bowel obstruction. 11. Mild thickening of wall of under distended urinary bladder. Mild cystitis cannot be excluded.   Electronically Signed   By: Lahoma Crocker M.D.   On: 05/19/2015 08:41   Ct Cervical Spine Wo Contrast  05/19/2015   CLINICAL DATA:  [place] this morning while using the bathroom, posterior neck and head pain, initial encounter, history hypertension, hyperlipidemia, coronary artery disease, TIA, former smoker  EXAM: CT HEAD WITHOUT CONTRAST  CT CERVICAL SPINE WITHOUT CONTRAST  TECHNIQUE: Multidetector CT imaging of the head and cervical spine  was performed following the standard protocol without intravenous contrast. Multiplanar CT image reconstructions of the cervical spine were also generated.  COMPARISON:  None  FINDINGS: CT HEAD FINDINGS  Generalized atrophy.  Normal ventricular morphology.  No midline shift or mass effect.  Small vessel chronic ischemic changes of deep cerebral white matter.  Tiny old lacunar infarct at LEFT basal ganglia and questionably RIGHT thalamus and LEFT pons.  No intracranial hemorrhage, mass lesion, or acute infarction.  No extra-axial fluid collections.  Visualized paranasal sinuses and mastoid air cells clear.  Bones unremarkable.  Atherosclerotic calcifications at the carotid siphons bilaterally.  CT CERVICAL SPINE FINDINGS  Prevertebral soft tissues normal thickness.  Scattered mild facet degenerative changes.  Minimal disc space narrowing and endplate spur formation C4-C5, C5-C6, C6-C7.  Vertebral body heights maintained without fracture or subluxation.  No bone destruction.  Visualized skullbase intact.  Visualized lung apices clear.  IMPRESSION: Atrophy with small vessel chronic ischemic changes of deep cerebral white matter.  Tiny old lacunar infarcts questioned.  No acute intracranial abnormalities.  Multilevel degenerative disc and facet disease changes cervical spine.  No acute cervical spine abnormalities.   Electronically Signed   By: Lavonia Dana M.D.   On: 05/19/2015 08:34   Mr Brain Wo Contrast  05/19/2015   CLINICAL DATA:  Syncope.  EXAM: MRI HEAD WITHOUT CONTRAST  TECHNIQUE: Multiplanar, multiecho pulse sequences of the brain and surrounding structures were obtained without intravenous contrast.  COMPARISON:  Head CT 05/19/2015 and MRI 06/25/2006  FINDINGS: There is no evidence of acute infarct, intracranial hemorrhage, mass, midline shift, or extra-axial fluid collection. Punctate focus of susceptibility artifact at the inferior right cerebellum corresponds to calcification on CT. There is mild  generalized cerebral atrophy. Patchy T2 hyperintensities throughout the subcortical and deep cerebral white matter have mildly progressed from the prior MRI and are nonspecific but compatible with moderate chronic small vessel ischemic disease.  Prior bilateral cataract extraction is noted. Small maxillary sinus mucous retention cysts are present. Mastoid air cells are clear. Major intracranial vascular flow voids are preserved.  IMPRESSION: 1. No acute intracranial abnormality. 2. Moderate chronic small vessel ischemic disease.   Electronically Signed   By: Logan Bores   On: 05/19/2015 14:03   Ct Abdomen Pelvis W Contrast  05/19/2015   CLINICAL DATA:  Fall this morning, neck pain, left rib pain  EXAM: CT CHEST, ABDOMEN, AND PELVIS WITH CONTRAST  TECHNIQUE: Multidetector CT imaging of the chest, abdomen and pelvis was performed following the standard protocol during bolus administration of intravenous contrast.  CONTRAST:  164mL OMNIPAQUE IOHEXOL 300 MG/ML  SOLN  COMPARISON:  None.  FINDINGS: CT CHEST FINDINGS  Sagittal images of the spine shows degenerative changes thoracic spine. Sagittal view of the sternum is unremarkable. No  scapular fracture is noted.  Central airways are patent.  Mild atherosclerotic calcifications of thoracic aorta. No mediastinal hematoma or adenopathy. Atherosclerotic calcifications of coronary arteries. Heart size within normal limits. Small hiatal hernia.  No displaced rib fractures are identified. Mild atelectasis noted bilateral lung bases posteriorly.  Axial image 49 there is probable subtle nondisplaced fracture in left lower anterior ribs/left base anteriorly. Probable left sixth rib. Mild adjacent pleural thickening.  Images of the lung parenchyma shows no lung contusion. There is no pneumothorax. There is mild displaced fracture of the left lateral ninth rib see axial image 65.  CT ABDOMEN AND PELVIS FINDINGS  Sagittal images of the spine shows degenerative changes lumbar  spine. No acute fractures are noted within lumbar spine. Mild degenerative changes bilateral SI joints. No pelvic fractures are noted.  Enhanced liver shows no laceration. Probable cyst in right hepatic lobe laterally measures 1 cm. The pancreas, spleen and adrenal glands are unremarkable. Kidneys are symmetrical in size and enhancement. A cyst in lower pole of the right kidney measures 1 cm. Cyst in midpole of the left kidney posterior aspect measures 2.5 cm. Atherosclerotic calcifications of abdominal aorta and iliac arteries.  No hydronephrosis or hydroureter. Delayed renal images shows bilateral renal symmetrical excretion. No evidence of renal laceration. No calcified gallstones are noted within gallbladder.  No small bowel obstruction.  No ascites or free air.  No adenopathy.  Sigmoid colon diverticula are noted. No evidence of acute diverticulitis. There is mild thickening of wall of under distended urinary bladder. Mild cystitis cannot be excluded. Prostate gland calcifications are noted. The prostate gland is not enlarged. Seminal vesicles are unremarkable. Mild degenerative changes pubic symphysis.  Coronal images shows mild degenerative changes bilateral hip joints.  No definite sacral fracture is noted.  No aortic aneurysm.  IMPRESSION: 1. There is subtle nondisplaced fracture of the left anterior sixth rib. Mild displaced fracture of the left ninth rib. 2. No mediastinal hematoma or adenopathy. 3. No lung contusion or pneumothorax. 4. Small hiatal hernia. 5. No acute visceral injury within abdomen or pelvis. 6. Degenerative changes lumbar spine. 7. Degenerative changes bilateral SI joints and hip joints. Mild degenerative changes pubic symphysis. 8. No pelvic fractures. 9. Sigmoid colon diverticula are noted. No evidence of acute diverticulitis. 10. No small bowel obstruction. 11. Mild thickening of wall of under distended urinary bladder. Mild cystitis cannot be excluded.   Electronically Signed   By:  Lahoma Crocker M.D.   On: 05/19/2015 08:41   Dg Chest Portable 1 View  05/19/2015   CLINICAL DATA:  Recent fall with left-sided chest pain  EXAM: PORTABLE CHEST - 1 VIEW  COMPARISON:  11/02/2013  FINDINGS: Cardiac shadow is stable. Thoracic aorta is again tortuous with mild calcifications. The lungs are well aerated with very minimal left basilar atelectasis. No bony abnormality is seen. No pneumothorax is noted.  IMPRESSION: Mild left basilar atelectasis.   Electronically Signed   By: Inez Catalina M.D.   On: 05/19/2015 07:19    Microbiology: Recent Results (from the past 240 hour(s))  Urine culture     Status: None (Preliminary result)   Collection Time: 05/19/15  7:38 AM  Result Value Ref Range Status   Specimen Description URINE, RANDOM  Final   Special Requests NONE  Final   Colony Count   Final    >=100,000 COLONIES/ML Performed at [facility]    Culture   Final    ENTEROCOCCUS SPECIES Performed at [facility]  Report Status PENDING  Incomplete     Labs: Basic Metabolic Panel:  Recent Labs Lab 05/19/15 0630 05/19/15 0638 05/20/15 0329  NA 136 138 136  K 3.6 3.6 3.9  CL 99* 101 102  CO2 26  --  27  GLUCOSE 119* 123* 97  BUN 22* 25* 19  CREATININE 1.54* 1.50* 1.51*  CALCIUM 9.1  --  8.7*   Liver Function Tests:  Recent Labs Lab 05/19/15 0630  AST 18  ALT 16*  ALKPHOS 73  BILITOT 0.6  PROT 6.0*  ALBUMIN 3.3*    Recent Labs Lab 05/19/15 0630  LIPASE 48   No results for input(s): AMMONIA in the last 168 hours. CBC:  Recent Labs Lab 05/19/15 0630 05/19/15 0638 05/20/15 0329  WBC 7.1  --  7.3  NEUTROABS 4.3  --   --   HGB 13.5 14.6 12.4*  HCT 40.6 43.0 37.2*  MCV 94.4  --  95.6  PLT 135*  --  120*   Cardiac Enzymes: No results for input(s): CKTOTAL, CKMB, CKMBINDEX, TROPONINI in the last 168 hours. BNP: BNP (last 3 results) No results for input(s): BNP in the last 8760 hours.  ProBNP (last 3 results) No results for  input(s): PROBNP in the last 8760 hours.  CBG:  Recent Labs Lab 05/19/15 0644 05/20/15 0603 05/21/15 0640  GLUCAP 115* 110* 101*       Signed:  Cristal Ford  [facility] 05/21/2015, 10:52 AM

## 2015-05-21 NOTE — Evaluation (Signed)
Occupational Therapy Evaluation Patient Details Name: Douglas Walker MRN: [id number] DOB: 1925-05-18 Today's Date: 05/21/2015    History of Present Illness Pt is an 81 y/o male with a PMH of paroxysmal a-fib, AV block, CAD, TIA, HTN, who presents to the [facility] after a syncopal episode at home. In the ED, pt was found to have 2 broken ribs.   Clinical Impression   PTA pt lived at home and was independent with use of SPC. Pt is limited by pain in ribs and generalized weakness and requires assist for functional mobility and ADLs. Pt will benefit from acute OT to progress to Supervision level.      Follow Up Recommendations  Supervision/Assistance - 24 hour;No OT follow up    Equipment Recommendations  None recommended by OT    Recommendations for Other Services       Precautions / Restrictions Precautions Precautions: Fall Restrictions Weight Bearing Restrictions: No      Mobility Bed Mobility Overal bed mobility: Needs Assistance Bed Mobility: Supine to Sit     Supine to sit: Supervision     General bed mobility comments: VC's for sequencing as pt requesting assistance. Heavy use of bed rails. Increased time to complete  Transfers Overall transfer level: Needs assistance Equipment used: Rolling walker (2 wheeled) Transfers: Sit to/from Stand Sit to Stand: Min guard         General transfer comment: Min guard for safety. No physical assist needed.          ADL Overall ADL's : Needs assistance/impaired Eating/Feeding: Independent;Sitting   Grooming: Set up;Sitting   Upper Body Bathing: Set up;Sitting   Lower Body Bathing: Minimal assistance;Sit to/from stand   Upper Body Dressing : Set up;Sitting   Lower Body Dressing: Moderate assistance;Sit to/from stand   Toilet Transfer: Min guard;Ambulation;RW Toilet Transfer Details (indicate cue type and reason): bed>recliner         Functional mobility during ADLs: Min guard;Rolling walker General ADL  Comments: R hand tremors began after short distance ambulation and impaired safety with use of RW     Vision Additional Comments: No change from baseline          Pertinent Vitals/Pain Pain Assessment: 0-10 Pain Score: 6  Pain Location: Rib fxs Pain Descriptors / Indicators: Stabbing;Aching Pain Intervention(s): Limited activity within patient's tolerance;Monitored during session;Repositioned     Hand Dominance Right   Extremity/Trunk Assessment Upper Extremity Assessment Upper Extremity Assessment: Overall WFL for tasks assessed   Lower Extremity Assessment Lower Extremity Assessment: Defer to PT evaluation   Cervical / Trunk Assessment Cervical / Trunk Assessment: Other exceptions Cervical / Trunk Exceptions: Forward head posture   Communication Communication Communication: No difficulties   Cognition Arousal/Alertness: Awake/alert Behavior During Therapy: WFL for tasks assessed/performed Overall Cognitive Status: Within Functional Limits for tasks assessed                                Home Living Family/patient expects to be discharged to:: Private residence Living Arrangements: Alone (with son for a couple weeks) Available Help at Discharge: Family;Available PRN/intermittently Type of Home: House Home Access: Level entry     Home Layout: Two level;Laundry or work area in Building surveyor of Steps: Building control surveyor: [place]: Environmental consultant - 2 wheels;Cane - single point          Prior Functioning/Environment Level of  Independence: Independent with assistive device(s)             OT Diagnosis: Generalized weakness;Acute pain   OT Problem List: Decreased strength;Decreased range of motion;Decreased activity tolerance;Impaired balance (sitting and/or standing);Decreased safety awareness;Decreased knowledge of use of DME or AE;Decreased knowledge of precautions;Impaired UE functional  use;Pain   OT Treatment/Interventions: Self-care/ADL training;Therapeutic exercise;Energy conservation;DME and/or AE instruction;Therapeutic activities;Patient/family education;Balance training    OT Goals(Current goals can be found in the care plan section) Acute Rehab OT Goals Patient Stated Goal: Decrease his pain, get back to his home OT Goal Formulation: With patient Time For Goal Achievement: 06/04/15 Potential to Achieve Goals: Good ADL Goals Pt Will Perform Grooming: with supervision;standing Pt Will Perform Lower Body Bathing: with supervision;sit to/from stand Pt Will Perform Lower Body Dressing: with supervision;sit to/from stand Pt Will Transfer to Toilet: with supervision;ambulating Pt Will Perform Toileting - Clothing Manipulation and hygiene: with supervision;sit to/from stand  OT Frequency: Min 2X/week    End of Session Equipment Utilized During Treatment: Rolling walker;Gait belt Nurse Communication: Mobility status  Activity Tolerance: Patient tolerated treatment well Patient left: in chair;with call bell/phone within reach;with chair alarm set   Time: 0037-0488 OT Time Calculation (min): 18 min Charges:  OT General Charges $OT Visit: 1 Procedure OT Evaluation $Initial OT Evaluation Tier I: 1 Procedure G-Codes:    Juluis Rainier Jun 18, 2015, 11:34 AM  Cyndie Chime, OTR/L Occupational Therapist [phone] (pager)

## 2015-05-22 DIAGNOSIS — I2510 Atherosclerotic heart disease of native coronary artery without angina pectoris: Secondary | ICD-10-CM | POA: Diagnosis not present

## 2015-05-22 DIAGNOSIS — S4992XD Unspecified injury of left shoulder and upper arm, subsequent encounter: Secondary | ICD-10-CM | POA: Diagnosis not present

## 2015-05-22 DIAGNOSIS — W19XXXD Unspecified fall, subsequent encounter: Secondary | ICD-10-CM | POA: Diagnosis not present

## 2015-05-22 DIAGNOSIS — R269 Unspecified abnormalities of gait and mobility: Secondary | ICD-10-CM | POA: Diagnosis not present

## 2015-05-22 DIAGNOSIS — I10 Essential (primary) hypertension: Secondary | ICD-10-CM | POA: Diagnosis not present

## 2015-05-22 DIAGNOSIS — Z87440 Personal history of urinary (tract) infections: Secondary | ICD-10-CM | POA: Diagnosis not present

## 2015-05-22 DIAGNOSIS — I441 Atrioventricular block, second degree: Secondary | ICD-10-CM | POA: Diagnosis not present

## 2015-05-22 DIAGNOSIS — R251 Tremor, unspecified: Secondary | ICD-10-CM | POA: Diagnosis not present

## 2015-05-22 DIAGNOSIS — Z7901 Long term (current) use of anticoagulants: Secondary | ICD-10-CM | POA: Diagnosis not present

## 2015-05-22 DIAGNOSIS — I480 Paroxysmal atrial fibrillation: Secondary | ICD-10-CM | POA: Diagnosis not present

## 2015-05-22 DIAGNOSIS — S2242XD Multiple fractures of ribs, left side, subsequent encounter for fracture with routine healing: Secondary | ICD-10-CM | POA: Diagnosis not present

## 2015-05-23 DIAGNOSIS — S4992XD Unspecified injury of left shoulder and upper arm, subsequent encounter: Secondary | ICD-10-CM | POA: Diagnosis not present

## 2015-05-23 DIAGNOSIS — I480 Paroxysmal atrial fibrillation: Secondary | ICD-10-CM | POA: Diagnosis not present

## 2015-05-23 DIAGNOSIS — I441 Atrioventricular block, second degree: Secondary | ICD-10-CM | POA: Diagnosis not present

## 2015-05-23 DIAGNOSIS — S2242XD Multiple fractures of ribs, left side, subsequent encounter for fracture with routine healing: Secondary | ICD-10-CM | POA: Diagnosis not present

## 2015-05-23 DIAGNOSIS — I2510 Atherosclerotic heart disease of native coronary artery without angina pectoris: Secondary | ICD-10-CM | POA: Diagnosis not present

## 2015-05-23 DIAGNOSIS — I10 Essential (primary) hypertension: Secondary | ICD-10-CM | POA: Diagnosis not present

## 2015-05-25 DIAGNOSIS — I10 Essential (primary) hypertension: Secondary | ICD-10-CM | POA: Diagnosis not present

## 2015-05-25 DIAGNOSIS — I441 Atrioventricular block, second degree: Secondary | ICD-10-CM | POA: Diagnosis not present

## 2015-05-25 DIAGNOSIS — I480 Paroxysmal atrial fibrillation: Secondary | ICD-10-CM | POA: Diagnosis not present

## 2015-05-25 DIAGNOSIS — S4992XD Unspecified injury of left shoulder and upper arm, subsequent encounter: Secondary | ICD-10-CM | POA: Diagnosis not present

## 2015-05-25 DIAGNOSIS — I2510 Atherosclerotic heart disease of native coronary artery without angina pectoris: Secondary | ICD-10-CM | POA: Diagnosis not present

## 2015-05-25 DIAGNOSIS — S2242XD Multiple fractures of ribs, left side, subsequent encounter for fracture with routine healing: Secondary | ICD-10-CM | POA: Diagnosis not present

## 2015-05-30 ENCOUNTER — Telehealth: Payer: Self-pay | Admitting: Interventional Cardiology

## 2015-05-30 ENCOUNTER — Encounter: Payer: Self-pay | Admitting: Physician Assistant

## 2015-05-30 ENCOUNTER — Ambulatory Visit (INDEPENDENT_AMBULATORY_CARE_PROVIDER_SITE_OTHER): Payer: Medicare Other | Admitting: *Deleted

## 2015-05-30 ENCOUNTER — Ambulatory Visit (INDEPENDENT_AMBULATORY_CARE_PROVIDER_SITE_OTHER): Payer: Medicare Other | Admitting: Physician Assistant

## 2015-05-30 VITALS — BP 130/70 | HR 56 | Ht 69.0 in | Wt 248.0 lb

## 2015-05-30 DIAGNOSIS — I480 Paroxysmal atrial fibrillation: Secondary | ICD-10-CM | POA: Diagnosis not present

## 2015-05-30 DIAGNOSIS — R55 Syncope and collapse: Secondary | ICD-10-CM

## 2015-05-30 DIAGNOSIS — S4992XD Unspecified injury of left shoulder and upper arm, subsequent encounter: Secondary | ICD-10-CM | POA: Diagnosis not present

## 2015-05-30 DIAGNOSIS — Z5181 Encounter for therapeutic drug level monitoring: Secondary | ICD-10-CM

## 2015-05-30 DIAGNOSIS — I10 Essential (primary) hypertension: Secondary | ICD-10-CM

## 2015-05-30 DIAGNOSIS — I5032 Chronic diastolic (congestive) heart failure: Secondary | ICD-10-CM | POA: Insufficient documentation

## 2015-05-30 DIAGNOSIS — I4891 Unspecified atrial fibrillation: Secondary | ICD-10-CM

## 2015-05-30 DIAGNOSIS — S2242XD Multiple fractures of ribs, left side, subsequent encounter for fracture with routine healing: Secondary | ICD-10-CM | POA: Diagnosis not present

## 2015-05-30 DIAGNOSIS — I441 Atrioventricular block, second degree: Secondary | ICD-10-CM | POA: Diagnosis not present

## 2015-05-30 DIAGNOSIS — I2510 Atherosclerotic heart disease of native coronary artery without angina pectoris: Secondary | ICD-10-CM | POA: Diagnosis not present

## 2015-05-30 LAB — POCT INR: INR: 1.8

## 2015-05-30 MED ORDER — VALSARTAN-HYDROCHLOROTHIAZIDE 160-25 MG PO TABS: 1.0000 | ORAL_TABLET | Freq: Every day | ORAL | Status: DC

## 2015-05-30 MED ORDER — FUROSEMIDE 20 MG PO TABS: 20.0000 mg | ORAL_TABLET | Freq: Every day | ORAL | Status: DC | PRN

## 2015-05-30 NOTE — Patient Instructions (Addendum)
Medication Instructions:  You can take Lasix 20 mg once a day as needed.  See the instructions below.  Labwork: None today  Testing/Procedures: You will be set up for an Event Monitor for 30 days.  Follow-Up: 1. You are being referred to one of our Electrophysiologist once your monitor is completed.  This cardiologist will help [place] determine whether or not you should have a pacemaker implanted.  2. Schedule a follow up with Dr. Daneen Schick in 3 mos.  Any Other Special Instructions Will Be Listed Below (If Applicable).  Weigh daily:  If Weight goes up 3 lbs in one day, or you have increased swelling or increased shortness of breath, you can take one Lasix tablet.  Call us if you take more than 1 Lasix in 1 week's time.

## 2015-05-30 NOTE — Progress Notes (Signed)
Cardiology Office Note   Date:  05/30/2015   ID:  Douglas Walker, DOB July 20, 1925, MRN [id number]  PCP:  Marjorie Smolder, MD  Cardiologist:  Dr. Daneen Schick     Chief Complaint  Patient presents with  . Loss of Consciousness    post hospital follow up     History of Present Illness: Douglas Walker is a 81 y.o. male with a hx of PAF on Coumadin anticoagulation, tachy-brady syndrome with prior 2nd degree AVB Type 2 (2:1 conduction) on beta-blocker, 1st degree AVB, diastolic HF, HTN, HL, prior TIA, GERD, macular degen.  Last seen by Dr. Daneen Schick in 9/15.  Holter at that time did not demonstrate any significant bradycardia arrhythmias.    Admitted 5/20-5/22 with post micturition syncope. Patient was noted to have a marked first-degree AV block with a PR of 400 ms. No significant arrhythmias were noted on telemetry. Echocardiogram demonstrated normal LV function, mild AI, mild LAE. Outpatient 30 day event monitor was planned. The patient was to follow up with EP.  He suffered rib fractures from his fall on the left side. This was treated conservatively. He was also treated for UTI. He was noted to be hypotensive and his antihypertensives medications were held/adjusted.  He returns for FU.  Of note, the RN from [facility] called today b/c of LE edema.  He is here with his son.  He does not drive.  He denies any further syncope.  The episode of syncope that prompted his admission occurred shortly after urinating in the middle of the night.  He was standing.  His son found him on the floor.  He was snoring and initially thought he was in bed.  He denies any chest pain. He is short of breath with some activities.  This is overall stable.  He is NYHA 2b.  He sleeps on 2 pillows chronically. No PND.  He does not feel that his LE edema is any worse.  Denies any cough or wheezing.  He denies any further syncope.  Denies any dizziness or near syncope.     Studies/Reports Reviewed Today:  Echo  05/19/15 - EF 55%to 60%. Wall motion was normal;   - Aortic valve: There was mild regurgitation. - Left atrium: The atrium was mildly dilated. - Atrial septum: No defect or patent foramen ovale was identified.  Holter 09/13/14 NSR, sinus bradycardia, first-degree AV block  Past Medical History  Diagnosis Date  . Hypertension   . Hyperlipemia   . Coronary artery disease   . TIA (transient ischemic attack)   . Hernia   . GERD (gastroesophageal reflux disease)   . Macular degeneration   . Paroxysmal atrial fibrillation 10/28/2013  . Tachycardia-bradycardia syndrome 09/13/2014    Past Surgical History  Procedure Laterality Date  . Hernia repair    . Appendectomy    . Hernia repair       Current Outpatient Prescriptions  Medication Sig Dispense Refill  . beta carotene w/minerals (OCUVITE) tablet Take 2 tablets by mouth once.     . DULoxetine (CYMBALTA) 20 MG capsule Take 20 mg by mouth 2 (two) times daily.    Marland Kitchen gabapentin (NEURONTIN) 100 MG capsule Take 100 mg by mouth 3 (three) times daily.    Marland Kitchen HYDROcodone-acetaminophen (NORCO/VICODIN) 5-325 MG per tablet Take 1-2 tablets by mouth every 4 (four) hours as needed for moderate pain. 30 tablet 0  . primidone (MYSOLINE) 50 MG tablet Take 50 mg by mouth 3 (three) times daily.     Marland Kitchen  Tamsulosin HCl (FLOMAX) 0.4 MG CAPS Take 0.4 mg by mouth daily after breakfast.     . valsartan-hydrochlorothiazide (DIOVAN-HCT) 160-25 MG per tablet Take 1 tablet by mouth daily.    Marland Kitchen warfarin (COUMADIN) 2.5 MG tablet Take 3.75-5 mg by mouth See admin instructions. Take 1 and 1/2 tablets on Tuesday and Saturday then take 2 tablets the other days     No current facility-administered medications for this visit.    Allergies:   Sulfa antibiotics    Social History:  The patient  reports that he quit smoking about 52 years ago. He has never used smokeless tobacco. He reports that he does not drink alcohol or use illicit drugs.   Family History:  The  patient's family history is negative for Heart attack and Stroke.    ROS:   Please see the history of present illness.   Review of Systems  Cardiovascular: Positive for dyspnea on exertion, leg swelling and syncope.  Respiratory: Positive for snoring.   Hematologic/Lymphatic: Bruises/bleeds easily.  Musculoskeletal: Positive for back pain.  Gastrointestinal: Positive for diarrhea.  Neurological: Positive for loss of balance.     PHYSICAL EXAM: VS:  BP 130/70 mmHg  Pulse 56  Ht 5\' 9"  (1.753 m)  Wt 248 lb (112.492 kg)  BMI 36.61 kg/m2    Wt Readings from Last 3 Encounters:  05/30/15 248 lb (112.492 kg)  05/21/15 244 lb 4.3 oz (110.8 kg)  09/13/14 239 lb (108.41 kg)     GEN: Well nourished, well developed, in no acute distress HEENT: normal Neck: no JVD,   no masses Cardiac:  Normal S1/S2, RRR; no murmur, no rubs or gallops, 1+ bilateral LE edema   Respiratory:  clear to auscultation bilaterally, no wheezing, rhonchi or rales. GI: soft, nontender, nondistended, + BS MS: no deformity or atrophy Skin: warm and dry  Neuro:  CNs II-XII intact, Strength and sensation are intact Psych: Normal affect   EKG:  EKG is ordered today.  It demonstrates:   Sinus brady, HR 56, 1st degree AVB (PR 400 ms), no change from prior tracing   Recent Labs: 05/19/2015: ALT 16*; TSH 4.839* 05/20/2015: BUN 19; Creatinine 1.51*; Hemoglobin 12.4*; Platelets 120*; Potassium 3.9; Sodium 136    Lipid Panel No results found for: CHOL, TRIG, HDL, CHOLHDL, VLDL, LDLCALC, LDLDIRECT    ASSESSMENT AND PLAN:  Syncope, unspecified syncope type:  Likely post micturition syncope.  However, he has a very long 1st degree AVB.  He needs an event monitor arranged and then FU with EP for further recommendations, if any. This was the recommendation of the attending cardiologist in the hospital. He does not drive.  Avoid AVN blocking agents.  If he has any evidence of high grade heart block, he will likely need a  pacer.    Paroxysmal atrial fibrillation:  Maintaining NSR.  Continue Coumadin.  Arrange Event monitor.  Will try to get him in for FU with the [facility] today.    Essential hypertension:  BP controlled.   Chronic diastolic CHF (congestive heart failure):  He has some LE edema which is likely chronic.  He does not feel like it is any worse.  He is up 9 lbs since 9/15.  He already takes HCTZ in his Diovan/HCT.  I will give him Lasix 20 mg to take QD prn for increasing edema, weight or worsening dyspnea.  I do not want him to take this on a regular basis.  I have asked him to call us  if he has to take Lasix more than 1 time a week.    Current medicines are reviewed at length with the patient today.  Concerns regarding medicines are as outlined above.  The following changes have been made:    Lasix 20 QD prn for edema, weight gain, increasing dyspnea.   Labs/ tests ordered today include:  Orders Placed This Encounter  Procedures  . Cardiac event monitor  . EKG 12-Lead    Disposition:   Refer to EP for FU after Event Monitor completed.  FU with Dr. Daneen Schick 3 mos.    Signed, Versie Starks, MHS 05/30/2015 2:29 PM    [facility] [place], [place], [place]  [address] Phone: [phone]; Fax: [phone]

## 2015-05-30 NOTE — Telephone Encounter (Signed)
Pt seeing [name], [place] today which at that time he will address edema per [facility] RN

## 2015-05-30 NOTE — Telephone Encounter (Signed)
New Message  Rn from [facility] calling to let office know that after visit today (5/31) she noticed edema in LE and feet and that he had no fluid rx. Rn wanted to make sure our office address that issue during his OV today ( 5/31 @ 1:30).

## 2015-06-01 DIAGNOSIS — S4992XD Unspecified injury of left shoulder and upper arm, subsequent encounter: Secondary | ICD-10-CM | POA: Diagnosis not present

## 2015-06-01 DIAGNOSIS — I480 Paroxysmal atrial fibrillation: Secondary | ICD-10-CM | POA: Diagnosis not present

## 2015-06-01 DIAGNOSIS — I441 Atrioventricular block, second degree: Secondary | ICD-10-CM | POA: Diagnosis not present

## 2015-06-01 DIAGNOSIS — S2242XD Multiple fractures of ribs, left side, subsequent encounter for fracture with routine healing: Secondary | ICD-10-CM | POA: Diagnosis not present

## 2015-06-01 DIAGNOSIS — I10 Essential (primary) hypertension: Secondary | ICD-10-CM | POA: Diagnosis not present

## 2015-06-01 DIAGNOSIS — I2510 Atherosclerotic heart disease of native coronary artery without angina pectoris: Secondary | ICD-10-CM | POA: Diagnosis not present

## 2015-06-06 ENCOUNTER — Ambulatory Visit (INDEPENDENT_AMBULATORY_CARE_PROVIDER_SITE_OTHER): Payer: Medicare Other

## 2015-06-06 DIAGNOSIS — R55 Syncope and collapse: Secondary | ICD-10-CM | POA: Diagnosis not present

## 2015-06-07 DIAGNOSIS — I10 Essential (primary) hypertension: Secondary | ICD-10-CM | POA: Diagnosis not present

## 2015-06-07 DIAGNOSIS — I441 Atrioventricular block, second degree: Secondary | ICD-10-CM | POA: Diagnosis not present

## 2015-06-07 DIAGNOSIS — S2242XD Multiple fractures of ribs, left side, subsequent encounter for fracture with routine healing: Secondary | ICD-10-CM | POA: Diagnosis not present

## 2015-06-07 DIAGNOSIS — I480 Paroxysmal atrial fibrillation: Secondary | ICD-10-CM | POA: Diagnosis not present

## 2015-06-07 DIAGNOSIS — S4992XD Unspecified injury of left shoulder and upper arm, subsequent encounter: Secondary | ICD-10-CM | POA: Diagnosis not present

## 2015-06-07 DIAGNOSIS — I2510 Atherosclerotic heart disease of native coronary artery without angina pectoris: Secondary | ICD-10-CM | POA: Diagnosis not present

## 2015-06-09 DIAGNOSIS — S2242XD Multiple fractures of ribs, left side, subsequent encounter for fracture with routine healing: Secondary | ICD-10-CM | POA: Diagnosis not present

## 2015-06-09 DIAGNOSIS — I441 Atrioventricular block, second degree: Secondary | ICD-10-CM | POA: Diagnosis not present

## 2015-06-09 DIAGNOSIS — S4992XD Unspecified injury of left shoulder and upper arm, subsequent encounter: Secondary | ICD-10-CM | POA: Diagnosis not present

## 2015-06-09 DIAGNOSIS — I480 Paroxysmal atrial fibrillation: Secondary | ICD-10-CM | POA: Diagnosis not present

## 2015-06-09 DIAGNOSIS — I10 Essential (primary) hypertension: Secondary | ICD-10-CM | POA: Diagnosis not present

## 2015-06-09 DIAGNOSIS — I2510 Atherosclerotic heart disease of native coronary artery without angina pectoris: Secondary | ICD-10-CM | POA: Diagnosis not present

## 2015-06-12 DIAGNOSIS — I441 Atrioventricular block, second degree: Secondary | ICD-10-CM | POA: Diagnosis not present

## 2015-06-12 DIAGNOSIS — S2242XD Multiple fractures of ribs, left side, subsequent encounter for fracture with routine healing: Secondary | ICD-10-CM | POA: Diagnosis not present

## 2015-06-12 DIAGNOSIS — I480 Paroxysmal atrial fibrillation: Secondary | ICD-10-CM | POA: Diagnosis not present

## 2015-06-12 DIAGNOSIS — S4992XD Unspecified injury of left shoulder and upper arm, subsequent encounter: Secondary | ICD-10-CM | POA: Diagnosis not present

## 2015-06-12 DIAGNOSIS — I10 Essential (primary) hypertension: Secondary | ICD-10-CM | POA: Diagnosis not present

## 2015-06-12 DIAGNOSIS — I2510 Atherosclerotic heart disease of native coronary artery without angina pectoris: Secondary | ICD-10-CM | POA: Diagnosis not present

## 2015-06-13 ENCOUNTER — Ambulatory Visit (INDEPENDENT_AMBULATORY_CARE_PROVIDER_SITE_OTHER): Payer: Medicare Other

## 2015-06-13 DIAGNOSIS — Z5181 Encounter for therapeutic drug level monitoring: Secondary | ICD-10-CM

## 2015-06-13 DIAGNOSIS — I4891 Unspecified atrial fibrillation: Secondary | ICD-10-CM | POA: Diagnosis not present

## 2015-06-13 DIAGNOSIS — I480 Paroxysmal atrial fibrillation: Secondary | ICD-10-CM

## 2015-06-13 LAB — POCT INR: INR: 2.3

## 2015-06-15 DIAGNOSIS — I480 Paroxysmal atrial fibrillation: Secondary | ICD-10-CM | POA: Diagnosis not present

## 2015-06-15 DIAGNOSIS — I2510 Atherosclerotic heart disease of native coronary artery without angina pectoris: Secondary | ICD-10-CM | POA: Diagnosis not present

## 2015-06-15 DIAGNOSIS — I441 Atrioventricular block, second degree: Secondary | ICD-10-CM | POA: Diagnosis not present

## 2015-06-15 DIAGNOSIS — S4992XD Unspecified injury of left shoulder and upper arm, subsequent encounter: Secondary | ICD-10-CM | POA: Diagnosis not present

## 2015-06-15 DIAGNOSIS — S2242XD Multiple fractures of ribs, left side, subsequent encounter for fracture with routine healing: Secondary | ICD-10-CM | POA: Diagnosis not present

## 2015-06-15 DIAGNOSIS — I10 Essential (primary) hypertension: Secondary | ICD-10-CM | POA: Diagnosis not present

## 2015-06-18 ENCOUNTER — Inpatient Hospital Stay (HOSPITAL_COMMUNITY)
Admission: EM | Admit: 2015-06-18 | Discharge: 2015-06-21 | DRG: 149 | Disposition: A | Discharge status: Home / Self Care | Payer: Medicare Other | Attending: Internal Medicine | Admitting: Internal Medicine

## 2015-06-18 ENCOUNTER — Encounter (HOSPITAL_COMMUNITY): Payer: Self-pay | Admitting: Emergency Medicine

## 2015-06-18 ENCOUNTER — Emergency Department (HOSPITAL_COMMUNITY): Payer: Medicare Other

## 2015-06-18 DIAGNOSIS — Z8673 Personal history of transient ischemic attack (TIA), and cerebral infarction without residual deficits: Secondary | ICD-10-CM

## 2015-06-18 DIAGNOSIS — D696 Thrombocytopenia, unspecified: Secondary | ICD-10-CM | POA: Diagnosis present

## 2015-06-18 DIAGNOSIS — E785 Hyperlipidemia, unspecified: Secondary | ICD-10-CM | POA: Diagnosis present

## 2015-06-18 DIAGNOSIS — N183 Chronic kidney disease, stage 3 unspecified: Secondary | ICD-10-CM | POA: Diagnosis present

## 2015-06-18 DIAGNOSIS — I10 Essential (primary) hypertension: Secondary | ICD-10-CM

## 2015-06-18 DIAGNOSIS — R42 Dizziness and giddiness: Secondary | ICD-10-CM

## 2015-06-18 DIAGNOSIS — N3000 Acute cystitis without hematuria: Secondary | ICD-10-CM

## 2015-06-18 DIAGNOSIS — I2510 Atherosclerotic heart disease of native coronary artery without angina pectoris: Secondary | ICD-10-CM | POA: Diagnosis present

## 2015-06-18 DIAGNOSIS — S0990XA Unspecified injury of head, initial encounter: Secondary | ICD-10-CM | POA: Diagnosis not present

## 2015-06-18 DIAGNOSIS — H8110 Benign paroxysmal vertigo, unspecified ear: Principal | ICD-10-CM | POA: Diagnosis present

## 2015-06-18 DIAGNOSIS — Z882 Allergy status to sulfonamides status: Secondary | ICD-10-CM

## 2015-06-18 DIAGNOSIS — I129 Hypertensive chronic kidney disease with stage 1 through stage 4 chronic kidney disease, or unspecified chronic kidney disease: Secondary | ICD-10-CM | POA: Diagnosis present

## 2015-06-18 DIAGNOSIS — Z7901 Long term (current) use of anticoagulants: Secondary | ICD-10-CM | POA: Diagnosis not present

## 2015-06-18 DIAGNOSIS — Z87891 Personal history of nicotine dependence: Secondary | ICD-10-CM

## 2015-06-18 DIAGNOSIS — K219 Gastro-esophageal reflux disease without esophagitis: Secondary | ICD-10-CM | POA: Diagnosis present

## 2015-06-18 DIAGNOSIS — N390 Urinary tract infection, site not specified: Secondary | ICD-10-CM | POA: Diagnosis present

## 2015-06-18 DIAGNOSIS — I480 Paroxysmal atrial fibrillation: Secondary | ICD-10-CM | POA: Diagnosis present

## 2015-06-18 DIAGNOSIS — I495 Sick sinus syndrome: Secondary | ICD-10-CM | POA: Diagnosis present

## 2015-06-18 DIAGNOSIS — Z66 Do not resuscitate: Secondary | ICD-10-CM | POA: Diagnosis present

## 2015-06-18 DIAGNOSIS — R8271 Bacteriuria: Secondary | ICD-10-CM | POA: Diagnosis present

## 2015-06-18 DIAGNOSIS — I441 Atrioventricular block, second degree: Secondary | ICD-10-CM | POA: Diagnosis present

## 2015-06-18 DIAGNOSIS — N1831 Chronic kidney disease, stage 3a: Secondary | ICD-10-CM | POA: Diagnosis present

## 2015-06-18 DIAGNOSIS — H8113 Benign paroxysmal vertigo, bilateral: Secondary | ICD-10-CM | POA: Diagnosis not present

## 2015-06-18 DIAGNOSIS — I5032 Chronic diastolic (congestive) heart failure: Secondary | ICD-10-CM | POA: Diagnosis not present

## 2015-06-18 LAB — URINALYSIS, ROUTINE W REFLEX MICROSCOPIC
Bilirubin Urine: NEGATIVE
Glucose, UA: NEGATIVE mg/dL
KETONES UR: NEGATIVE mg/dL
Nitrite: NEGATIVE
PH: 6.5 (ref 5.0–8.0)
Protein, ur: NEGATIVE mg/dL
Specific Gravity, Urine: 1.009 (ref 1.005–1.030)
Urobilinogen, UA: 0.2 mg/dL (ref 0.0–1.0)

## 2015-06-18 LAB — BASIC METABOLIC PANEL
Anion gap: 8 (ref 5–15)
BUN: 23 mg/dL — ABNORMAL HIGH (ref 6–20)
CO2: 28 mmol/L (ref 22–32)
Calcium: 9 mg/dL (ref 8.9–10.3)
Chloride: 103 mmol/L (ref 101–111)
Creatinine, Ser: 1.58 mg/dL — ABNORMAL HIGH (ref 0.61–1.24)
GFR calc Af Amer: 44 mL/min — ABNORMAL LOW (ref >60)
GFR calc non Af Amer: 38 mL/min — ABNORMAL LOW (ref >60)
Glucose, Bld: 102 mg/dL — ABNORMAL HIGH (ref 65–99)
Potassium: 4.1 mmol/L (ref 3.5–5.1)
SODIUM: 139 mmol/L (ref 135–145)

## 2015-06-18 LAB — I-STAT CHEM 8, ED
BUN: 23 mg/dL — ABNORMAL HIGH (ref 6–20)
CALCIUM ION: 1.09 mmol/L — AB (ref 1.13–1.30)
Chloride: 102 mmol/L (ref 101–111)
Creatinine, Ser: 1.7 mg/dL — ABNORMAL HIGH (ref 0.61–1.24)
Glucose, Bld: 96 mg/dL (ref 65–99)
HEMATOCRIT: 40 % (ref 39.0–52.0)
HEMOGLOBIN: 13.6 g/dL (ref 13.0–17.0)
POTASSIUM: 4 mmol/L (ref 3.5–5.1)
Sodium: 138 mmol/L (ref 135–145)
TCO2: 24 mmol/L (ref 0–100)

## 2015-06-18 LAB — CBG MONITORING, ED: Glucose-Capillary: 92 mg/dL (ref 65–99)

## 2015-06-18 LAB — PROTIME-INR
INR: 2.19 — ABNORMAL HIGH (ref 0.00–1.49)
Prothrombin Time: 24.2 seconds — ABNORMAL HIGH (ref 11.6–15.2)

## 2015-06-18 LAB — CBC
HEMATOCRIT: 38 % — AB (ref 39.0–52.0)
HEMOGLOBIN: 12.3 g/dL — AB (ref 13.0–17.0)
MCH: 31.2 pg (ref 26.0–34.0)
MCHC: 32.4 g/dL (ref 30.0–36.0)
MCV: 96.4 fL (ref 78.0–100.0)
Platelets: 129 10*3/uL — ABNORMAL LOW (ref 150–400)
RBC: 3.94 MIL/uL — ABNORMAL LOW (ref 4.22–5.81)
RDW: 14.1 % (ref 11.5–15.5)
WBC: 5.7 10*3/uL (ref 4.0–10.5)

## 2015-06-18 LAB — URINE MICROSCOPIC-ADD ON

## 2015-06-18 LAB — MAGNESIUM: MAGNESIUM: 1.9 mg/dL (ref 1.7–2.4)

## 2015-06-18 LAB — APTT: aPTT: 38 seconds — ABNORMAL HIGH (ref 24–37)

## 2015-06-18 MED ORDER — MECLIZINE HCL 25 MG PO TABS
25.0000 mg | ORAL_TABLET | Freq: Once | ORAL | Status: AC
  Administered 2015-06-18: 25 mg via ORAL
  Filled 2015-06-18: qty 1

## 2015-06-18 MED ORDER — SODIUM CHLORIDE 0.9 % IJ SOLN
3.0000 mL | Freq: Two times a day (BID) | INTRAMUSCULAR | Status: DC
  Administered 2015-06-18 – 2015-06-21 (×5): 3 mL via INTRAVENOUS

## 2015-06-18 MED ORDER — GABAPENTIN 100 MG PO CAPS
100.0000 mg | ORAL_CAPSULE | Freq: Three times a day (TID) | ORAL | Status: DC
  Administered 2015-06-18 – 2015-06-21 (×10): 100 mg via ORAL
  Filled 2015-06-18 (×13): qty 1

## 2015-06-18 MED ORDER — WARFARIN - PHARMACIST DOSING INPATIENT
Freq: Every day | Status: DC
Start: 2015-06-18 — End: 2015-06-21

## 2015-06-18 MED ORDER — SODIUM CHLORIDE 0.9 % IV BOLUS (SEPSIS)
1000.0000 mL | Freq: Once | INTRAVENOUS | Status: AC
  Administered 2015-06-18: 1000 mL via INTRAVENOUS

## 2015-06-18 MED ORDER — ALUM & MAG HYDROXIDE-SIMETH 200-200-20 MG/5ML PO SUSP: 30.0000 mL | Freq: Four times a day (QID) | ORAL | Status: DC | PRN

## 2015-06-18 MED ORDER — ACETAMINOPHEN 500 MG PO TABS: 1000.0000 mg | ORAL_TABLET | Freq: Four times a day (QID) | ORAL | Status: DC | PRN

## 2015-06-18 MED ORDER — CEFTRIAXONE SODIUM IN DEXTROSE 20 MG/ML IV SOLN
1.0000 g | INTRAVENOUS | Status: DC
  Administered 2015-06-18 – 2015-06-20 (×3): 1 g via INTRAVENOUS
  Filled 2015-06-18 (×3): qty 50

## 2015-06-18 MED ORDER — POLYETHYLENE GLYCOL 3350 17 G PO PACK: 17.0000 g | PACK | Freq: Every day | ORAL | Status: DC | PRN

## 2015-06-18 MED ORDER — PRIMIDONE 50 MG PO TABS
50.0000 mg | ORAL_TABLET | Freq: Three times a day (TID) | ORAL | Status: DC
  Administered 2015-06-18 – 2015-06-21 (×11): 50 mg via ORAL
  Filled 2015-06-18 (×13): qty 1

## 2015-06-18 MED ORDER — WARFARIN SODIUM 5 MG PO TABS
5.0000 mg | ORAL_TABLET | Freq: Once | ORAL | Status: AC
  Administered 2015-06-18: 5 mg via ORAL
  Filled 2015-06-18: qty 1

## 2015-06-18 MED ORDER — OCUVITE PO TABS
2.0000 | ORAL_TABLET | Freq: Once | ORAL | Status: AC
  Administered 2015-06-18: 2 via ORAL
  Filled 2015-06-18: qty 2

## 2015-06-18 MED ORDER — PANTOPRAZOLE SODIUM 40 MG PO TBEC
40.0000 mg | DELAYED_RELEASE_TABLET | Freq: Every day | ORAL | Status: DC
  Administered 2015-06-18 – 2015-06-21 (×4): 40 mg via ORAL
  Filled 2015-06-18 (×5): qty 1

## 2015-06-18 MED ORDER — DOCUSATE SODIUM 100 MG PO CAPS
100.0000 mg | ORAL_CAPSULE | Freq: Two times a day (BID) | ORAL | Status: DC
  Administered 2015-06-18 – 2015-06-21 (×6): 100 mg via ORAL
  Filled 2015-06-18 (×9): qty 1

## 2015-06-18 MED ORDER — SORBITOL 70 % SOLN
30.0000 mL | Freq: Every day | Status: DC | PRN
Start: 2015-06-18 — End: 2015-06-21
  Filled 2015-06-18: qty 30

## 2015-06-18 MED ORDER — SODIUM CHLORIDE 0.9 % IV SOLN
INTRAVENOUS | Status: AC
  Administered 2015-06-18: 10:00:00 via INTRAVENOUS
  Administered 2015-06-18: 75 mL/h via INTRAVENOUS

## 2015-06-18 MED ORDER — DULOXETINE HCL 20 MG PO CPEP
20.0000 mg | ORAL_CAPSULE | Freq: Two times a day (BID) | ORAL | Status: DC
  Administered 2015-06-18 – 2015-06-21 (×7): 20 mg via ORAL
  Filled 2015-06-18 (×9): qty 1

## 2015-06-18 MED ORDER — TAMSULOSIN HCL 0.4 MG PO CAPS
0.4000 mg | ORAL_CAPSULE | Freq: Every day | ORAL | Status: DC
  Administered 2015-06-18 – 2015-06-21 (×4): 0.4 mg via ORAL
  Filled 2015-06-18 (×4): qty 1

## 2015-06-18 MED ORDER — ONDANSETRON HCL 4 MG/2ML IJ SOLN: 4.0000 mg | Freq: Four times a day (QID) | INTRAMUSCULAR | Status: DC | PRN

## 2015-06-18 MED ORDER — HYDROCODONE-ACETAMINOPHEN 5-325 MG PO TABS
1.0000 | ORAL_TABLET | ORAL | Status: DC | PRN
  Administered 2015-06-19 (×2): 1 via ORAL
  Administered 2015-06-21: 2 via ORAL
  Filled 2015-06-18 (×2): qty 1
  Filled 2015-06-18: qty 2

## 2015-06-18 MED ORDER — ALBUTEROL SULFATE (2.5 MG/3ML) 0.083% IN NEBU: 2.5000 mg | INHALATION_SOLUTION | RESPIRATORY_TRACT | Status: DC | PRN

## 2015-06-18 MED ORDER — ONDANSETRON HCL 4 MG PO TABS: 4.0000 mg | ORAL_TABLET | Freq: Four times a day (QID) | ORAL | Status: DC | PRN

## 2015-06-18 NOTE — ED Notes (Signed)
Per PTAR, patient woke up this am, was unable to get up from bed due to dizziness. Patient states after @ 20 minutes he called his son. PTAR states that patient was eventually able to stand and ambulate to stretcher. Patient with recent fall, is wearing a portable cardiac monitor on arrival to ER. Patient denies any pain, only complaint is dizziness.

## 2015-06-18 NOTE — ED Notes (Signed)
Spoke to patient's cardiac halter monitoring company, states they will fax over the patient's records.

## 2015-06-18 NOTE — ED Provider Notes (Signed)
Date: 06/18/2015  Rate: 56  Rhythm: normal sinus rhythm  QRS Axis: normal  Intervals: marked first degree av block ~639ms  ST/T Wave abnormalities: normal  Conduction Disutrbances: none  Narrative Interpretation: sinus bradycardia with marked 1st deg av block    Medical screening examination/treatment/procedure(s) were conducted as a shared visit with non-physician practitioner(s) and myself.  I personally evaluated the patient during the encounter.   EKG Interpretation None       Briefly, pt is a 81 y.o. male presenting with near syncopal episode.  He has recently been admitted for identical symptoms, with suspicion of tachy-brady syndrome.  I performed an examination on the patient including cardiac, pulmonary, and gi systems which were unremarkable.  Pt's symptoms could be orthostatic, but given the history, admitted for telemetry.  I attempted to call cardiology, however despite multiple attempts was unable to reach anyone.     Debby Freiberg, MD 06/18/15 [phone]

## 2015-06-18 NOTE — H&P (Signed)
[facility] History and Physical  Douglas Walker QQP:[id number] DOB: Aug 13, 1925 DOA: 06/18/2015  Referring physician: Antonietta Breach, PA PCP: Marjorie Smolder, MD  Cardiology: Dr. Daneen Schick  Chief Complaint: Dizziness  HPI: Douglas Walker is a 81 y.o. male  With history of hypertension, hyperlipidemia, coronary artery disease, prior history of TIA, paroxysmal atrial fibrillation on chronic Coumadin therapy, tachybradycardia syndrome with history of intermittent Mobitz type II AV block, who was recently hospitalized 05/19/2015 to 05/21/2015 with a syncopal episode with concerns for cardiogenic etiology and patient subsequently discharged with a Holter monitor which was placed on 06/06/2015. Patient presents to the ED with sudden onset of dizziness. Vision stated that one day prior to admission he felt fine all day had lunch with his nieces then subsequently went home his son came by to undo his Holter monitor so he could take a shower and Holter monitor was placed back on after that. Patient stated he ate dinner with his [employer] television and subsequently went to bed around 10 PM. Vision states woke up around 2 AM to go to the bathroom however when he tried to get up he felt very swimmy headed and it took 5 attempts following to get up from a supine to a seated position. Patient states a family got up and went to the bathroom and subsequently went back to bed however when sitting on the side of the bed felt very dizzy and subsequently laid back down. Patient denies any syncopal events. Patient denies any spinning sensations. Patient subsequently called his son who came by and 911 was called and patient was brought to the emergency room. Patient does endorse some generalized weakness, dysuria. Patient denies any chest pain, no shortness of breath, no fever, no chills, no nausea, no vomiting, no diaphoresis, no abdominal pain, no melanoma, no hematemesis, no hematochezia, no cough, no other  associated symptoms. Patient does state a Holter monitor was placed on 06/06/2015 and is 2, off on 07/06/2015. Patient was seen in the emergency room. EKG was not done. Basic metabolic profile are the BUN of 23 creatinine of 1.58 and subsequently 1.70 otherwise was within normal limits. CBC had a platelet count of 129 otherwise was within normal limits. INR was 2.19. CT head was negative. [facility] were consulted to admit the patient for further evaluation and management.   Review of Systems: As per history of present illness otherwise negative.  Constitutional:  No weight loss, night sweats, Fevers, chills, fatigue.  HEENT:  No headaches, Difficulty swallowing,Tooth/dental problems,Sore throat,  No sneezing, itching, ear ache, nasal congestion, post nasal drip,  Cardio-vascular:  No chest pain, Orthopnea, PND, swelling in lower extremities, anasarca, dizziness, palpitations  GI:  No heartburn, indigestion, abdominal pain, nausea, vomiting, diarrhea, change in bowel habits, loss of appetite  Resp:  No shortness of breath with exertion or at rest. No excess mucus, no productive cough, No non-productive cough, No coughing up of blood.No change in color of mucus.No wheezing.No chest wall deformity  Skin:  no rash or lesions.  GU:  no dysuria, change in color of urine, no urgency or frequency. No flank pain.  Musculoskeletal:  No joint pain or swelling. No decreased range of motion. No back pain.  Psych:  No change in mood or affect. No depression or anxiety. No memory loss.   Past Medical History  Diagnosis Date  . Hypertension   . Hyperlipemia   . Coronary artery disease   . TIA (transient ischemic attack)   . Hernia   .  GERD (gastroesophageal reflux disease)   . Macular degeneration   . Paroxysmal atrial fibrillation 10/28/2013  . Tachycardia-bradycardia syndrome 09/13/2014   Past Surgical History  Procedure Laterality Date  . Hernia repair    . Appendectomy    .  Hernia repair     Social History:  reports that he quit smoking about 52 years ago. He has never used smokeless tobacco. He reports that he does not drink alcohol or use illicit drugs.  Allergies  Allergen Reactions  . Sulfa Antibiotics Other (See Comments)    Weakness, dizziness    Family History  Problem Relation Age of Onset  . Heart attack Neg Hx   . Stroke Neg Hx    patient states did not know his father who left when he was very young around age 33. Patient states mother deceased at age 68 with unknown etiology of her cause of death. Patient is currently widowed.  Prior to Admission medications   Medication Sig Start Date End Date Taking? Authorizing Provider  acetaminophen (TYLENOL) 500 MG tablet Take 1,000 mg by mouth every 6 (six) hours as needed for mild pain.   Yes Historical Provider, MD  beta carotene w/minerals (OCUVITE) tablet Take 2 tablets by mouth once.    Yes Historical Provider, MD  DULoxetine (CYMBALTA) 20 MG capsule Take 20 mg by mouth 2 (two) times daily.   Yes Historical Provider, MD  gabapentin (NEURONTIN) 100 MG capsule Take 100 mg by mouth 3 (three) times daily.   Yes Historical Provider, MD  HYDROcodone-acetaminophen (NORCO/VICODIN) 5-325 MG per tablet Take 1-2 tablets by mouth every 4 (four) hours as needed for moderate pain. 05/21/15  Yes Maryann Mikhail, DO  primidone (MYSOLINE) 50 MG tablet Take 50 mg by mouth 3 (three) times daily.  08/15/14  Yes Historical Provider, MD  Tamsulosin HCl (FLOMAX) 0.4 MG CAPS Take 0.4 mg by mouth daily after breakfast.    Yes Historical Provider, MD  valsartan-hydrochlorothiazide (DIOVAN-HCT) 160-25 MG per tablet Take 1 tablet by mouth daily. 05/30/15  Yes Liliane Shi, PA-C  warfarin (COUMADIN) 2.5 MG tablet Take 5-6.25 mg by mouth See admin instructions. Take 2 and 1/2 tablets on Tuesday, Thursday, Saturday then take 2 tablets the other days 05/02/14  Yes Belva Crome, MD   Physical Exam: Filed Vitals:   06/18/15 0541  06/18/15 0647 06/18/15 0700 06/18/15 0701  BP: 123/55 145/57  145/73  Pulse: 57 58  54  Temp:    97.5 F (36.4 C)  TempSrc:    Oral  Resp: 15 16  18   Height:   5\' 9"  (1.753 m)   Weight:   112.2 kg (247 lb 5.7 oz)   SpO2: 99% 98%  99%    Wt Readings from Last 3 Encounters:  06/18/15 112.2 kg (247 lb 5.7 oz)  05/30/15 112.492 kg (248 lb)  05/21/15 110.8 kg (244 lb 4.3 oz)    General:  Well-developed well-nourished in no acute cardiopulmonary distress. Speaking in full sentences.  Eyes: PERRLA, EOMI, normal lids, irises & conjunctiva ENT: grossly normal hearing, lips & tongue Neck: no LAD, masses or thyromegaly Cardiovascular: bradycardia, no m/r/g. No LE edema. Telemetry:  probable 2.10 second pause with probable Mobitz type II AV block. Respiratory: CTA bilaterally, no w/r/r. Normal respiratory effort. Abdomen: soft, ntnd, positive bowel sounds, no rebound, no guarding Skin: no rash or induration seen on limited exam Musculoskeletal: grossly normal tone BUE/BLE Psychiatric: grossly normal mood and affect, speech fluent and appropriate Neurologic:  alert  and oriented 3. Cranial nerves II through XII are grossly intact. Sensation is intact. Gait not tested secondary to safety. No focal deficits.           Labs on Admission:  Basic Metabolic Panel:  Recent Labs Lab 06/18/15 0404 06/18/15 0411  NA 139 138  K 4.1 4.0  CL 103 102  CO2 28  --   GLUCOSE 102* 96  BUN 23* 23*  CREATININE 1.58* 1.70*  CALCIUM 9.0  --    Liver Function Tests: No results for input(s): AST, ALT, ALKPHOS, BILITOT, PROT, ALBUMIN in the last 168 hours. No results for input(s): LIPASE, AMYLASE in the last 168 hours. No results for input(s): AMMONIA in the last 168 hours. CBC:  Recent Labs Lab 06/18/15 0404 06/18/15 0411  WBC 5.7  --   HGB 12.3* 13.6  HCT 38.0* 40.0  MCV 96.4  --   PLT 129*  --    Cardiac Enzymes: No results for input(s): CKTOTAL, CKMB, CKMBINDEX, TROPONINI in the last  168 hours.  BNP (last 3 results) No results for input(s): BNP in the last 8760 hours.  ProBNP (last 3 results) No results for input(s): PROBNP in the last 8760 hours.  CBG:  Recent Labs Lab 06/18/15 0405  GLUCAP 92    Radiological Exams on Admission: Ct Head Wo Contrast  06/18/2015   CLINICAL DATA:  Patient woke up this morning with severe dizziness. Recent fall.  EXAM: CT HEAD WITHOUT CONTRAST  TECHNIQUE: Contiguous axial images were obtained from the base of the skull through the vertex without intravenous contrast.  COMPARISON:  MRI brain 05/19/2015.  CT head 05/19/2015.  FINDINGS: Diffuse cerebral atrophy. No ventricular dilatation. Patchy low-attenuation changes in the deep white matter consistent with small vessel ischemia. No mass effect or midline shift. No abnormal extra-axial fluid collections. Gray-white matter junctions are distinct. Basal cisterns are not effaced. No evidence of acute intracranial hemorrhage. No depressed skull fractures. Visualized paranasal sinuses and mastoid air cells are not opacified.  IMPRESSION: No acute intracranial abnormalities. Mild chronic atrophy and small vessel ischemic changes.   Electronically Signed   By: Lucienne Capers M.D.   On: 06/18/2015 05:58    EKG: Not done.  Assessment/Plan Principal Problem:   Dizziness Active Problems:   Thrombocytopenia   Paroxysmal atrial fibrillation   Mobitz type II atrioventricular block   Essential hypertension   Chronic anticoagulation   Tachycardia-bradycardia syndrome   Chronic diastolic CHF (congestive heart failure)   UTI (urinary tract infection)   CKD (chronic kidney disease) stage 3, GFR 30-59 ml/min: Probable  #1 dizziness/presyncope Questionable etiology. May be cardiogenic in nature. Patient recently hospitalized for syncopal episode and sent home on a Holter monitor. Patient with history of atrial fibrillation, intermittent mobile it's type II AV block with tachybradycardia syndrome,  significant first degree AV block. Patient on antihypertensives. Patient had a workup during his last hospitalization 05/19/2015 with a 2-D echo which was done which showed a EF of 55-60% with no wall abnormalities. Mild aortic valvular regurgitation. Mildly dilated left atrium. CT head done on admission was negative. Patient with no focal neurological symptoms. MRI of the head which was done during last hospitalization was negative for any acute abnormalities. Admit the patient to telemetry. Check a EKG. Check orthostatics. PT for vestibular evaluation. Cardiology consultation for further evaluation and management and concern for need for pacemaker. Gentle hydration. Supportive care. Recordings from Holter monitor have been requested. ED physician. Follow.  #2 atrial fibrillation/tachybradycardia syndrome/intermittent mobitz  type II AV block Patient currently rate controlled. Continue Coumadin for anticoagulation.  #3 hypertension Stable. Hold antihypertensives medications for now.  #4 probable urinary tract infection Urinalysis with moderate leukocytes negative nitrate 7-10 WBCs. Patient complaining of dysuria. Check a urine culture. Will place on  IV Rocephin. Follow.  #5 chronic diastolic CHF Stable. Follow for now. Patient is not on chronic diuretics. Unable to place on a beta blocker secondary to history of tachybradycardia syndrome with intermittent Mobitz type II AV block. Cardiology consultation pending.  #6 probable chronic kidney disease stage III Patient's baseline creatinine seems to range from 1.5-2 area currently at baseline. Follow for now.  #7 prophylaxis PPI for GI prophylaxis. On Coumadin for DVT prophylaxis.    Code Status: DNR DVT Prophylaxis: On coumadin Family Communication: updated patient. No family present Disposition Plan: ADMIT TO TELEMETRY  Time spent: [place] MD [facility] Pager [phone]

## 2015-06-18 NOTE — ED Notes (Signed)
Patient taken to CT.

## 2015-06-18 NOTE — ED Notes (Signed)
Pt states that yesterday planting flowers in the sun for a few hours. Afterwards he called his son to tell him the he was "beat".

## 2015-06-18 NOTE — ED Notes (Signed)
Bed: WA17 Expected date:  Expected time:  Means of arrival:  Comments: EMS 47M dizzy

## 2015-06-18 NOTE — Consult Note (Signed)
Admit date: 06/18/2015 Referring Physician  Dr. Grandville Silos Primary Physician Marjorie Smolder, MD Primary Cardiologist  Dr. Tamala Julian Reason for Consultation  Syncope  HPI: 81 year old male with tachycardia-bradycardia syndrome, paroxysmal atrial fibrillation on Coumadin, history of first-degree AV block as well as second-degree AV block type II with 2-1 conduction, prior TIA hypertension, hyperlipidemia who was admitted earlier this morning with weakness, near syncope, dizziness. Recently on June 7 had event monitor placed because of recurrent episodes of severe dizziness. Earlier this morning/late last night he was trying to get up out of bed and couldn't sit up. He fell 5 times back under was pillow out of significant weakness, feeling dizzy, swimmy headedness. His son came, pushed the event monitor button, took him to the hospital. EMS reported normal blood pressure.  This morning, when trying to sit up in bed, he feels "swimmy headed" I had him reduplicate this sensation and he struggled quite a bit to try to sit up. Telemetry monitor showed no evidence of any adverse arrhythmias.  Holter monitor back in September 2015 did not demonstrate any significant bradycardic arrhythmias. He was admitted May 20-22 of 2016 with micturition syncope. No significant arrhythmias were noted on telemetry. EF was normal with mild aortic insufficiency. Plan was to follow-up with electrophysiology. He unfortunately suffered rib fractures from his fall on his left side and this was treated conservatively. He was treated for urinary tract infection and is found to be hypotensive and several of his medications were adjusted.  He does not drive, lower extremity edema has been present. He has been taking Lasix 20 mg a day as needed for edema.  Currently we are waiting the faxed results from the event monitor company of what happened last night.     PMH:   Past Medical History  Diagnosis Date  . Hypertension   .  Hyperlipemia   . Coronary artery disease   . TIA (transient ischemic attack)   . Hernia   . GERD (gastroesophageal reflux disease)   . Macular degeneration   . Paroxysmal atrial fibrillation 10/28/2013  . Tachycardia-bradycardia syndrome 09/13/2014    PSH:   Past Surgical History  Procedure Laterality Date  . Hernia repair    . Appendectomy    . Hernia repair     Allergies:  Sulfa antibiotics Prior to Admit Meds:   Prior to Admission medications   Medication Sig Start Date End Date Taking? Authorizing Provider  acetaminophen (TYLENOL) 500 MG tablet Take 1,000 mg by mouth every 6 (six) hours as needed for mild pain.   Yes Historical Provider, MD  beta carotene w/minerals (OCUVITE) tablet Take 2 tablets by mouth once.    Yes Historical Provider, MD  DULoxetine (CYMBALTA) 20 MG capsule Take 20 mg by mouth 2 (two) times daily.   Yes Historical Provider, MD  gabapentin (NEURONTIN) 100 MG capsule Take 100 mg by mouth 3 (three) times daily.   Yes Historical Provider, MD  HYDROcodone-acetaminophen (NORCO/VICODIN) 5-325 MG per tablet Take 1-2 tablets by mouth every 4 (four) hours as needed for moderate pain. 05/21/15  Yes Maryann Mikhail, DO  primidone (MYSOLINE) 50 MG tablet Take 50 mg by mouth 3 (three) times daily.  08/15/14  Yes Historical Provider, MD  Tamsulosin HCl (FLOMAX) 0.4 MG CAPS Take 0.4 mg by mouth daily after breakfast.    Yes Historical Provider, MD  valsartan-hydrochlorothiazide (DIOVAN-HCT) 160-25 MG per tablet Take 1 tablet by mouth daily. 05/30/15  Yes Liliane Shi, PA-C  warfarin (COUMADIN) 2.5 MG  tablet Take 5-6.25 mg by mouth See admin instructions. Take 2 and 1/2 tablets on Tuesday, Thursday, Saturday then take 2 tablets the other days 05/02/14  Yes Belva Crome, MD  furosemide (LASIX) 20 MG tablet Take 1 tablet (20 mg total) by mouth daily as needed. Patient taking differently: Take 20 mg by mouth daily as needed for fluid.  05/30/15 05/29/16  Liliane Shi, PA-C   Fam  HX:    Family History  Problem Relation Age of Onset  . Heart attack Neg Hx   . Stroke Neg Hx    Social HX:    History   Social History  . Marital Status: Married    Spouse Name: N/A  . Number of Children: N/A  . Years of Education: N/A   Occupational History  . Not on file.   Social History Main Topics  . Smoking status: Former Smoker    Quit date: 12/30/1962  . Smokeless tobacco: Never Used  . Alcohol Use: No  . Drug Use: No  . Sexual Activity: No   Other Topics Concern  . Not on file   Social History Narrative     ROS: No recent fevers, chills, orthopnea, PND. Positive for weakness, dizziness. No syncope on this admission. All 11 ROS were addressed and are negative except what is stated in the HPI   Physical Exam: Blood pressure 145/73, pulse 54, temperature 97.5 F (36.4 C), temperature source Oral, resp. rate 18, height 5\' 9"  (1.753 m), weight 247 lb 5.7 oz (112.2 kg), SpO2 99 %.   General: Elderly in no acute distress Head: Eyes PERRLA, No xanthomas.   Normal cephalic and atramatic  Lungs:   Clear bilaterally to auscultation and percussion. Normal respiratory effort. No wheezes, no rales. Heart:   HRRR S1 S2 Pulses are 2+ & equal. No murmur, rubs, gallops.  No carotid bruit. No JVD.  No abdominal bruits.  Abdomen: Bowel sounds are positive, abdomen soft and non-tender without masses. No hepatosplenomegaly. Overweight Msk:  Back normal. Had difficulty trying to sit up, decreased strength Extremities:  No clubbing, cyanosis , 1+ bilateral lower extremity edema.  DP +1 Neuro: Alert and oriented X 3, non-focal, MAE x 4 GU: Deferred Rectal: Deferred Psych:  Good affect, responds appropriately      Labs: Lab Results  Component Value Date   WBC 5.7 06/18/2015   HGB 13.6 06/18/2015   HCT 40.0 06/18/2015   MCV 96.4 06/18/2015   PLT 129* 06/18/2015     Recent Labs Lab 06/18/15 0404 06/18/15 0411  NA 139 138  K 4.1 4.0  CL 103 102  CO2 28  --   BUN  23* 23*  CREATININE 1.58* 1.70*  CALCIUM 9.0  --   GLUCOSE 102* 96   No results for input(s): CKTOTAL, CKMB, TROPONINI in the last 72 hours. No results found for: CHOL, HDL, LDLCALC, TRIG No results found for: DDIMER   Radiology:  Ct Head Wo Contrast  06/18/2015   CLINICAL DATA:  Patient woke up this morning with severe dizziness. Recent fall.  EXAM: CT HEAD WITHOUT CONTRAST  TECHNIQUE: Contiguous axial images were obtained from the base of the skull through the vertex without intravenous contrast.  COMPARISON:  MRI brain 05/19/2015.  CT head 05/19/2015.  FINDINGS: Diffuse cerebral atrophy. No ventricular dilatation. Patchy low-attenuation changes in the deep white matter consistent with small vessel ischemia. No mass effect or midline shift. No abnormal extra-axial fluid collections. Gray-white matter junctions are distinct. Basal cisterns  are not effaced. No evidence of acute intracranial hemorrhage. No depressed skull fractures. Visualized paranasal sinuses and mastoid air cells are not opacified.  IMPRESSION: No acute intracranial abnormalities. Mild chronic atrophy and small vessel ischemic changes.   Electronically Signed   By: Lucienne Capers M.D.   On: 06/18/2015 05:58   Personally viewed.  EKG:   05/30/15-sinus rhythm with marked first-degree AV block, 400 ms Personally viewed.   Telemetry: Most recently, very low amplitude P waves are seen consistent with atrial fibrillation/flutter. Occasional pauses of 2.1 seconds noted. No pauses greater than 3 seconds. Awaiting faxed report from event monitor company  Echocardiogram: 05/19/15 - Left ventricle: The cavity size was normal. Systolic function was normal. The estimated ejection fraction was in the range of 55% to 60%. Wall motion was normal; there were no regional wall motion abnormalities. - Aortic valve: There was mild regurgitation. - Left atrium: The atrium was mildly dilated. - Atrial septum: No defect or patent foramen  ovale was identified.  ASSESSMENT/PLAN:    81 year old male with multiple recurrent syncopal episodes, last admission in May after suffering rib fractures from a fall with marked first-degree AV block, prior history of Mobitz 2, 2-1 conduction, dizziness, prior stroke, micturition syncope.  1. Near Syncope-more so dizziness, swimmy headedness he states. He adamantly states that he did not have syncope prompting this admission. Mostly weakness, dizziness, inability to get out of bed effectively. Awaiting results of event monitor. Marked first-degree AV block noted on prior EKG with history of 2-1 conduction. During prior hospitalization, it was thought that he was experiencing micturition syncope or perhaps vasovagal syncope. There certainly could be an autonomic component however he does have a marked conduction disorder. After his event monitor was going to be completed, started on June 7, plan was to have electrophysiology see him for possible evaluation of pacemaker. When I had him try to sit up in bed, he was extremely weak and was struggling to do so. He went back onto his parallel and stated that he had the "swimmy headed feeling ". I promptly reviewed telemetry which showed no evidence of any bradycardia or pauses. Despite his significant underlying conduction disorder, I'm not convinced that a pacemaker will rid him of his current symptoms and I explained this to him. Certainly, the prior episode of syncope where he fell and hit his flank, broke ribs could have been mediated by significant bradycardia but he was not under monitoring presence at that time. Continue to monitor on telemetry today. If no further adverse arrhythmias, I would recommend further physical therapy, potential he vestibular physical therapy and continue with outpatient electrophysiology referral as has been previously discussed by [name] PA in outpatient clinic.  2. Paroxysmal atrial fibrillation-warfarin for chronic  anticoagulation. Stable.  3. Essential hypertension-on Diovan HCTZ. Currently normotensive, slightly elevated. Checking orthostatics.  4. Chronic diastolic heart failure-stable. Euvolemic. Does have lower extremity edema at times. He will occasionally take as needed Lasix 20 mg.  We will follow.  Candee Furbish, MD  06/18/2015  8:39 AM

## 2015-06-18 NOTE — Evaluation (Signed)
Physical Therapy Evaluation Patient Details Name: Douglas Walker MRN: [id number] DOB: [birth year] Today's Date: 06/18/2015   History of Present Illness  Douglas Walker is a 81 y.o. male with history of hypertension, hyperlipidemia, coronary artery disease, prior history of TIA, paroxysmal atrial fibrillation on chronic Coumadin therapy, tachybradycardia syndrome with history of intermittent Mobitz type II AV block, who was recently hospitalized 05/19/2015 to 05/21/2015 with a syncopal episode, with L broken ribs, with concerns for cardiogenic etiology and patient subsequently discharged with a Holter monitor which was placed on 06/06/2015. Patient presents this visit with sudden onset of dizziness. Vision stated that one day prior to admission he felt fine all day had lunch with his nieces then subsequently went home his son came by to undo his Holter monitor so he could take a shower and Holter monitor was placed back on after that. Patient stated he ate dinner with his [employer] television and subsequently went to bed around 10 PM. Vision states woke up around 2 AM to go to the bathroom however when he tried to get up he felt very swimmy headed and it took 5 attempts following to get up from a supine to a seated position. Patient states a family got up and went to the bathroom and subsequently went back to bed however when sitting on the side of the bed felt very dizzy and subsequently laid back down..   Clinical Impression  Performed some vestibular testing with this very pleasant gentleman with modified sit to side lying test with very positive nystagmus for left side (seated turned head to right side and side lying to the left side with downward beating /left beating very evident nystagmus). This was performed on the other side with very minimal provoked nystagmus. Eye tests were all negative as well. Rolling to pt's left was also very positive for nystagmus. Pt seems positive for BPPV on Left side  and started some treatment today. Will need at least one more session for repositioning tomorrow before pt can begin to get upright on his feet safely. Will continue to follow. Ortho BPS were taken as well and entered, not positive for orthostatic hypertension during this session.     Follow Up Recommendations Home health PT (depending on pt's progress, will need to request VESTIBULAR f/u with HHPT )    Equipment Recommendations  None recommended by PT    Recommendations for Other Services       Precautions / Restrictions Precautions Precautions: Fall Precaution Comments: having great amounts of dizziness with movment      Mobility  Bed Mobility Overal bed mobility: Needs Assistance Bed Mobility: Supine to Sit;Sit to Supine     Supine to sit: Mod assist Sit to supine: Mod assist   General bed mobility comments: Mod A due to dizziness the movment provoked an pt unable to maiantian upright without assist and cues. rolling to pt's left provokes dizziness and great nystagmus. Educated pt and wrote on board and nursing that if pt needs to sit EOB, please exit on pt's right side of bed to minimize dizziness and nystagmus at this time.   Transfers Overall transfer level: Needs assistance   Transfers: Sit to/from Stand Sit to Stand: +2 safety/equipment;Min assist         General transfer comment: very garded educated to keep eyes and head fixed staright ahead with sit to stand and pt did very well with this. Pt very fearful of provoking dizziness for it is servere. Totlerated sadnign for about  2 minutes then sat back down. Strong on feet, just limited due to nystagumus and dizziness.   Ambulation/Gait Ambulation/Gait assistance:  (not tested today for we were focusingon testing for vestibular nad system was taxed. )              Stairs            Wheelchair Mobility    Modified Rankin (Stroke Patients Only)       Balance                                              Pertinent Vitals/Pain Pain Assessment: No/denies pain (occassion pain in L ribs from previous fall previous hopsitalization)    Home Living Family/patient expects to be discharged to:: Private residence Living Arrangements: Alone Available Help at Discharge: Family;Available PRN/intermittently (good family support with son) Type of Home: House Home Access: Stairs to enter Entrance Stairs-Rails: Right Entrance Stairs-Number of Steps: 2-3 Home Layout: Two level;Able to live on main level with bedroom/bathroom;Laundry or work area in [place]: Kasandra Knudsen - single point;Walker - 2 wheels;Bedside commode;Shower seat (all this equipment was for his wife)      Prior Function Level of Independence: Independent         Comments: occassionally uses wlaking stick when out int he yard. Does not drive anymore.      Hand Dominance        Extremity/Trunk Assessment               Lower Extremity Assessment: Overall WFL for tasks assessed (strength fairly well, just limited due to dizziness and vestibular issues. )         Communication   Communication: No difficulties  Cognition Arousal/Alertness: Awake/alert Behavior During Therapy: WFL for tasks assessed/performed Overall Cognitive Status: Within Functional Limits for tasks assessed                      General Comments      Exercises        Assessment/Plan    PT Assessment Patient needs continued PT services  PT Diagnosis Difficulty walking   PT Problem List Decreased activity tolerance;Decreased balance;Decreased mobility  PT Treatment Interventions Gait training;Functional mobility training;Therapeutic activities;Therapeutic exercise;Patient/family education   PT Goals (Current goals can be found in the Care Plan section) Acute Rehab PT Goals Patient Stated Goal: I want this spinning to go away PT Goal Formulation: With patient Time For Goal Achievement:  07/02/15 Potential to Achieve Goals: Good    Frequency Min 3X/week   Barriers to discharge        Co-evaluation               End of Session Equipment Utilized During Treatment: Gait belt Activity Tolerance: Patient tolerated treatment well Patient left: in bed;with call bell/phone within reach (with HOB elevated to minimize rolling and supine at this time after testing. ) Nurse Communication: Mobility status         Time: 6144-3154 PT Time Calculation (min) (ACUTE ONLY): 59 min   Charges:   PT Evaluation $Initial PT Evaluation Tier I: 1 Procedure PT Treatments $Therapeutic Exercise: 8-22 mins $Therapeutic Activity: 23-37 mins   PT G CodesClide Dales 07/04/2015, 6:31 PM  Clide Dales, PT Pager: [phone] 07/04/2015

## 2015-06-18 NOTE — ED Provider Notes (Signed)
CSN: [id number]     Arrival date & time 06/18/15  0092 History   First MD Initiated Contact with Patient 06/18/15 0405     Chief Complaint  Patient presents with  . Dizziness    (Consider location/radiation/quality/duration/timing/severity/associated sxs/prior Treatment) HPI Comments: 81 year old male with history of hypertension, hyperlipidemia, CAD, TIA, paroxysmal atrial fibrillation on chronic Coumadin, and tachycardia-bradycardia syndrome presents to the emergency department for further evaluation of dizziness. Patient reports going to bed at 2200 yesterday evening. He woke up to use the bathroom at approximately 2 AM and reports feeling "woozy" when trying to sit up. Patient reports persistent symptoms with position change. He reports that it took him 5 attempts to get from a supine to a seated position in his bed. Patient has had persistence of his symptoms which are relieved when he is not moving. He denies any headache, chest pain, shortness of breath, nausea, vomiting, extremity numbness/paresthesias, or extremity weakness with his symptoms. Patient is currently on a Holter monitor following admission at the end of May for a syncopal event. He denies any syncope prior to arrival. He was able to call his son who called [facility] to bring him to the emergency department. No falls PTA.  Cardiologist - Dr. Tamala Julian PCP - Dr. Inda Merlin  The history is provided by the patient. No language interpreter was used.    Past Medical History  Diagnosis Date  . Hypertension   . Hyperlipemia   . Coronary artery disease   . TIA (transient ischemic attack)   . Hernia   . GERD (gastroesophageal reflux disease)   . Macular degeneration   . Paroxysmal atrial fibrillation 10/28/2013  . Tachycardia-bradycardia syndrome 09/13/2014   Past Surgical History  Procedure Laterality Date  . Hernia repair    . Appendectomy    . Hernia repair     Family History  Problem Relation Age of Onset  . Heart attack Neg Hx    . Stroke Neg Hx    History  Substance Use Topics  . Smoking status: Former Smoker    Quit date: 12/30/1962  . Smokeless tobacco: Never Used  . Alcohol Use: No    Review of Systems  Constitutional: Negative for fever.  Respiratory: Negative for shortness of breath.   Cardiovascular: Negative for chest pain.  Gastrointestinal: Negative for nausea and vomiting.  Neurological: Positive for dizziness. Negative for syncope and headaches.  All other systems reviewed and are negative.   Allergies  Sulfa antibiotics  Home Medications   Prior to Admission medications   Medication Sig Start Date End Date Taking? Authorizing Provider  acetaminophen (TYLENOL) 500 MG tablet Take 1,000 mg by mouth every 6 (six) hours as needed for mild pain.   Yes Historical Provider, MD  beta carotene w/minerals (OCUVITE) tablet Take 2 tablets by mouth once.    Yes Historical Provider, MD  DULoxetine (CYMBALTA) 20 MG capsule Take 20 mg by mouth 2 (two) times daily.   Yes Historical Provider, MD  gabapentin (NEURONTIN) 100 MG capsule Take 100 mg by mouth 3 (three) times daily.   Yes Historical Provider, MD  HYDROcodone-acetaminophen (NORCO/VICODIN) 5-325 MG per tablet Take 1-2 tablets by mouth every 4 (four) hours as needed for moderate pain. 05/21/15  Yes Maryann Mikhail, DO  primidone (MYSOLINE) 50 MG tablet Take 50 mg by mouth 3 (three) times daily.  08/15/14  Yes Historical Provider, MD  Tamsulosin HCl (FLOMAX) 0.4 MG CAPS Take 0.4 mg by mouth daily after breakfast.    Yes Historical  Provider, MD  valsartan-hydrochlorothiazide (DIOVAN-HCT) 160-25 MG per tablet Take 1 tablet by mouth daily. 05/30/15  Yes Liliane Shi, PA-C  warfarin (COUMADIN) 2.5 MG tablet Take 5-6.25 mg by mouth See admin instructions. Take 2 and 1/2 tablets on Tuesday, Thursday, Saturday then take 2 tablets the other days 05/02/14  Yes Belva Crome, MD  furosemide (LASIX) 20 MG tablet Take 1 tablet (20 mg total) by mouth daily as  needed. Patient taking differently: Take 20 mg by mouth daily as needed for fluid.  05/30/15 05/29/16  Scott T Weaver, PA-C   BP 123/55 mmHg  Pulse 57  Temp(Src) 97.9 F (36.6 C) (Oral)  Resp 15  SpO2 99%   Physical Exam  Constitutional: He is oriented to person, place, and time. He appears well-developed and well-nourished. No distress.  Nontoxic/nonseptic appearing  HENT:  Head: Normocephalic and atraumatic.  Mouth/Throat: Oropharynx is clear and moist. No oropharyngeal exudate.  Symmetric rise of the uvula with phonation.  Eyes: Conjunctivae and EOM are normal. Pupils are equal, round, and reactive to light. No scleral icterus.  Neck: Normal range of motion.  Cardiovascular: Regular rhythm and intact distal pulses.   Bradycardic rate, regular rhythm  Pulmonary/Chest: Effort normal and breath sounds normal. No respiratory distress. He has no wheezes. He has no rales.  Lungs CTAB. Chest expansion symmetric.  Musculoskeletal: Normal range of motion.  Neurological: He is alert and oriented to person, place, and time. Coordination normal.  GCS 15. Speech is goal oriented. No notable cranial nerve deficits appreciated; difficult to assess nystagmus as patient with poor eyesight to track fingers. Tremor noted in RUE with purposeful movements; son reports that this is per patient's baseline. Sensation to light touch intact in all extremities.  Skin: Skin is warm and dry. No rash noted. He is not diaphoretic. No erythema. No pallor.  Psychiatric: He has a normal mood and affect. His behavior is normal.  Nursing note and vitals reviewed.   ED Course  Procedures (including critical care time) Labs Review Labs Reviewed  CBC - Abnormal; Notable for the following:    RBC 3.94 (*)    Hemoglobin 12.3 (*)    HCT 38.0 (*)    Platelets 129 (*)    All other components within normal limits  BASIC METABOLIC PANEL - Abnormal; Notable for the following:    Glucose, Bld 102 (*)    BUN 23 (*)     Creatinine, Ser 1.58 (*)    GFR calc non Af Amer 38 (*)    GFR calc Af Amer 44 (*)    All other components within normal limits  PROTIME-INR - Abnormal; Notable for the following:    Prothrombin Time 24.2 (*)    INR 2.19 (*)    All other components within normal limits  APTT - Abnormal; Notable for the following:    aPTT 38 (*)    All other components within normal limits  URINALYSIS, ROUTINE W REFLEX MICROSCOPIC (NOT AT [facility]) - Abnormal; Notable for the following:    Hgb urine dipstick MODERATE (*)    Leukocytes, UA MODERATE (*)    All other components within normal limits  I-STAT CHEM 8, ED - Abnormal; Notable for the following:    BUN 23 (*)    Creatinine, Ser 1.70 (*)    Calcium, Ion 1.09 (*)    All other components within normal limits  URINE MICROSCOPIC-ADD ON  CBG MONITORING, ED  Randolm Idol, ED    Imaging Review Ct  Head Wo Contrast  06/18/2015   CLINICAL DATA:  Patient woke up this morning with severe dizziness. Recent fall.  EXAM: CT HEAD WITHOUT CONTRAST  TECHNIQUE: Contiguous axial images were obtained from the base of the skull through the vertex without intravenous contrast.  COMPARISON:  MRI brain 05/19/2015.  CT head 05/19/2015.  FINDINGS: Diffuse cerebral atrophy. No ventricular dilatation. Patchy low-attenuation changes in the deep white matter consistent with small vessel ischemia. No mass effect or midline shift. No abnormal extra-axial fluid collections. Gray-white matter junctions are distinct. Basal cisterns are not effaced. No evidence of acute intracranial hemorrhage. No depressed skull fractures. Visualized paranasal sinuses and mastoid air cells are not opacified.  IMPRESSION: No acute intracranial abnormalities. Mild chronic atrophy and small vessel ischemic changes.   Electronically Signed   By: Lucienne Capers M.D.   On: 06/18/2015 05:58     EKG Interpretation None      MDM   Final diagnoses:  Dizziness    81 year old male presents to the  emergency department for further evaluation of dizziness. No syncope prior to arrival, the patient has a history of a syncopal event at the end of May. He is currently wearing a Holter monitor for further evaluation of this syncope. He has a history significant for paroxysmal atrial fibrillation. He also has a marked first degree AV block with history of intermittent Mobitz type II AV block. In addition, patient with tachycardia-bradycardia syndrome. Cardiology note from prior admission mentions low threshold for pacemaker placement in the future.  During evaluations by myself and my attending, Dr. Colin Rhein, patient's symptoms appear to be more consistent with orthostasis rather than vertigo. He has a negative head CT today and no focal neurologic deficits on exam. No complaints of headache. Holter monitor company in the process of faxing over results to see if symptoms correlate with any changes on the Holter monitor.  Patient lives alone in a house. He continues to have persistent symptoms with position change in the emergency department. I do not believe he is stable for discharge for this reason. Given cardiology's seemingly low threshold for pacemaker placement, he will likely benefit from an inpatient cardiac consult for further evaluation of his symptoms today. Case also discussed with Dr. Blaine Hamper of [facility]; [facility] to admit patient to hospital for further work up. Will consult morning cardiology team regarding patient's admission to the hospital today.   Filed Vitals:   06/18/15 0430 06/18/15 0504 06/18/15 0504 06/18/15 0541  BP:  123/55  123/55  Pulse: 56 53  57  Temp:   97.9 F (36.6 C)   TempSrc:      Resp: 13 15  15   SpO2: 99% 99%  99%     Antonietta Breach, PA-C 06/18/15 1660  Debby Freiberg, MD 06/22/15 [phone]

## 2015-06-18 NOTE — Progress Notes (Signed)
ANTICOAGULATION CONSULT NOTE - Initial Consult  Pharmacy Consult for warfarin Indication: atrial fibrillation  Allergies  Allergen Reactions  . Sulfa Antibiotics Other (See Comments)    Weakness, dizziness    Patient Measurements: Height: 5\' 9"  (175.3 cm) Weight: 247 lb 5.7 oz (112.2 kg) IBW/kg (Calculated) : 70.7  Vital Signs: Temp: 97.5 F (36.4 C) (06/19 0701) Temp Source: Oral (06/19 0701) BP: 145/73 mmHg (06/19 0701) Pulse Rate: 54 (06/19 0701)  Labs:  Recent Labs  06/18/15 0404 06/18/15 0411  HGB 12.3* 13.6  HCT 38.0* 40.0  PLT 129*  --   APTT 38*  --   LABPROT 24.2*  --   INR 2.19*  --   CREATININE 1.58* 1.70*    Estimated Creatinine Clearance: 37.8 mL/min (by C-G formula based on Cr of 1.7).   Medical History: Past Medical History  Diagnosis Date  . Hypertension   . Hyperlipemia   . Coronary artery disease   . TIA (transient ischemic attack)   . Hernia   . GERD (gastroesophageal reflux disease)   . Macular degeneration   . Paroxysmal atrial fibrillation 10/28/2013  . Tachycardia-bradycardia syndrome 09/13/2014    Medications:  Scheduled:  . beta carotene w/minerals  2 tablet Oral Once  . cefTRIAXone (ROCEPHIN)  IV  1 g Intravenous Q24H  . docusate sodium  100 mg Oral BID  . DULoxetine  20 mg Oral BID  . gabapentin  100 mg Oral TID  . pantoprazole  40 mg Oral Q0600  . primidone  50 mg Oral TID  . sodium chloride  3 mL Intravenous Q12H  . tamsulosin  0.4 mg Oral QPC breakfast    Assessment: 81 year old male with history of hypertension, hyperlipidemia, CAD, TIA, paroxysmal atrial fibrillation on chronic Coumadin, and tachycardia-bradycardia syndrome presents to the emergency department for further evaluation of dizziness.  Pharmacy consulted to continue dosing warfarin for atrial fibrillation.  Home dose: 5mg  on Sun, Mon, Wed,Fri and 6.25mg  on Tues, Thur, Sat.       Last dose 6/18  Today 6/19: INR 2.19 Scr 1.7 H/H WNL Cardiac diet  ordered No drug interactions noted  Goal of Therapy:  INR 2-3   Plan:  Warfarin 5mg  po x 1 @ 1800 Daily INR  Dolly Rias RPh 06/18/2015, 9:30 AM Pager [phone]

## 2015-06-19 DIAGNOSIS — H8110 Benign paroxysmal vertigo, unspecified ear: Secondary | ICD-10-CM | POA: Diagnosis present

## 2015-06-19 DIAGNOSIS — N183 Chronic kidney disease, stage 3 (moderate): Secondary | ICD-10-CM

## 2015-06-19 LAB — BASIC METABOLIC PANEL
ANION GAP: 6 (ref 5–15)
BUN: 21 mg/dL — ABNORMAL HIGH (ref 6–20)
CO2: 27 mmol/L (ref 22–32)
CREATININE: 1.47 mg/dL — AB (ref 0.61–1.24)
Calcium: 8.6 mg/dL — ABNORMAL LOW (ref 8.9–10.3)
Chloride: 105 mmol/L (ref 101–111)
GFR calc non Af Amer: 41 mL/min — ABNORMAL LOW (ref >60)
GFR, EST AFRICAN AMERICAN: 48 mL/min — AB (ref >60)
Glucose, Bld: 101 mg/dL — ABNORMAL HIGH (ref 65–99)
POTASSIUM: 4 mmol/L (ref 3.5–5.1)
Sodium: 138 mmol/L (ref 135–145)

## 2015-06-19 LAB — CBC
HCT: 35.9 % — ABNORMAL LOW (ref 39.0–52.0)
Hemoglobin: 11.8 g/dL — ABNORMAL LOW (ref 13.0–17.0)
MCH: 31.7 pg (ref 26.0–34.0)
MCHC: 32.9 g/dL (ref 30.0–36.0)
MCV: 96.5 fL (ref 78.0–100.0)
PLATELETS: 118 10*3/uL — AB (ref 150–400)
RBC: 3.72 MIL/uL — AB (ref 4.22–5.81)
RDW: 14.3 % (ref 11.5–15.5)
WBC: 6.2 10*3/uL (ref 4.0–10.5)

## 2015-06-19 LAB — URINE CULTURE

## 2015-06-19 LAB — PROTIME-INR
INR: 2.28 — ABNORMAL HIGH (ref 0.00–1.49)
PROTHROMBIN TIME: 24.9 s — AB (ref 11.6–15.2)

## 2015-06-19 MED ORDER — WARFARIN SODIUM 5 MG PO TABS
5.0000 mg | ORAL_TABLET | Freq: Once | ORAL | Status: AC
  Administered 2015-06-19: 5 mg via ORAL
  Filled 2015-06-19: qty 1

## 2015-06-19 MED ORDER — MECLIZINE HCL 25 MG PO TABS
25.0000 mg | ORAL_TABLET | Freq: Three times a day (TID) | ORAL | Status: DC
  Administered 2015-06-19 – 2015-06-21 (×7): 25 mg via ORAL
  Filled 2015-06-19 (×8): qty 1

## 2015-06-19 NOTE — Progress Notes (Signed)
[facility] PROGRESS NOTE  Douglas Walker FKC:[id number] DOB: 05-16-1925 DOA: 06/18/2015 PCP: Henrine Screws, MD  Assessment/Plan: #1 dizziness/Prob BPPV Likely multifactorial. May likely be secondary to BPPV as patient was noted to have a positive evaluation per PT. Patient with vestibular exercises yesterday and today. Patient still with dizziness. We'll place on meclizine 25 mg by mouth 3 times a day. PT following. Patient also noted to have 2-3 brief pauses over the past 12 hours. Patient has been assessed by cardiology who are not convinced that pacemaker will resolve his symptoms. Per cardiology patient has an appointment with EP on 07/19/2015 and is to follow-up at that time. No AV nodal blocking agents. Follow.  #2 paroxysmal atrial fibrillation/tachybradycardia syndrome/intermittent Mobitz type II AV block Currently rate controlled. Continue Coumadin for anticoagulation.  #3 hypertension Stable. Continue Flomax.  #4 probable UTI Urine cultures pending. Continue IV Rocephin.  #5 chronic diastolic CHF Stable. Unable to place patient on a beta blocker secondary to problem #2. Patient has been seen by cardiology and recommend outpatient follow-up.  #6 probable chronic kidney disease stage III Stable.  #7 prophylaxis PPI for GI prophylaxis. On Coumadin for DVT prophylaxis.   Code Status: DO NOT RESUSCITATE Family Communication: Updated patient no family present. Disposition Plan:  Home when dizziness has improved.   Consultants:  Cardiology: Dr. Marlou Porch 06/18/2015  Procedures:  CT head 06/18/2015    Antibiotics:  IV Rocephin 06/18/2015  HPI/Subjective: Patient still complaining of dizziness especially with positional changes of the head to the left. Patient denies any chest pain or shortness of breath.  Objective: Filed Vitals:   06/19/15 0525  BP: 132/63  Pulse: 59  Temp: 97.8 F (36.6 C)  Resp: 18    Intake/Output Summary (Last 24 hours)  at 06/19/15 0944 Last data filed at 06/19/15 0559  Gross per 24 hour  Intake 988.75 ml  Output    975 ml  Net  13.75 ml   Filed Weights   06/18/15 0700 06/19/15 0525  Weight: 112.2 kg (247 lb 5.7 oz) 113.2 kg (249 lb 9 oz)    Exam:   General:  NAD  Cardiovascular: RRR  Respiratory: CTAB  Abdomen: Soft/NT/ND/+BS  Musculoskeletal: No c/c/e  Data Reviewed: Basic Metabolic Panel:  Recent Labs Lab 06/18/15 0404 06/18/15 0411 06/19/15 0417  NA 139 138 138  K 4.1 4.0 4.0  CL 103 102 105  CO2 28  --  27  GLUCOSE 102* 96 101*  BUN 23* 23* 21*  CREATININE 1.58* 1.70* 1.47*  CALCIUM 9.0  --  8.6*  MG 1.9  --   --    Liver Function Tests: No results for input(s): AST, ALT, ALKPHOS, BILITOT, PROT, ALBUMIN in the last 168 hours. No results for input(s): LIPASE, AMYLASE in the last 168 hours. No results for input(s): AMMONIA in the last 168 hours. CBC:  Recent Labs Lab 06/18/15 0404 06/18/15 0411 06/19/15 0417  WBC 5.7  --  6.2  HGB 12.3* 13.6 11.8*  HCT 38.0* 40.0 35.9*  MCV 96.4  --  96.5  PLT 129*  --  118*   Cardiac Enzymes: No results for input(s): CKTOTAL, CKMB, CKMBINDEX, TROPONINI in the last 168 hours. BNP (last 3 results) No results for input(s): BNP in the last 8760 hours.  ProBNP (last 3 results) No results for input(s): PROBNP in the last 8760 hours.  CBG:  Recent Labs Lab 06/18/15 0405  GLUCAP 92    No results found for this or any previous visit (  from the past 240 hour(s)).   Studies: Ct Head Wo Contrast  06/18/2015   CLINICAL DATA:  Patient woke up this morning with severe dizziness. Recent fall.  EXAM: CT HEAD WITHOUT CONTRAST  TECHNIQUE: Contiguous axial images were obtained from the base of the skull through the vertex without intravenous contrast.  COMPARISON:  MRI brain 05/19/2015.  CT head 05/19/2015.  FINDINGS: Diffuse cerebral atrophy. No ventricular dilatation. Patchy low-attenuation changes in the deep white matter consistent  with small vessel ischemia. No mass effect or midline shift. No abnormal extra-axial fluid collections. Gray-white matter junctions are distinct. Basal cisterns are not effaced. No evidence of acute intracranial hemorrhage. No depressed skull fractures. Visualized paranasal sinuses and mastoid air cells are not opacified.  IMPRESSION: No acute intracranial abnormalities. Mild chronic atrophy and small vessel ischemic changes.   Electronically Signed   By: Lucienne Capers M.D.   On: 06/18/2015 05:58    Scheduled Meds: . cefTRIAXone (ROCEPHIN)  IV  1 g Intravenous Q24H  . docusate sodium  100 mg Oral BID  . DULoxetine  20 mg Oral BID  . gabapentin  100 mg Oral TID  . pantoprazole  40 mg Oral Q0600  . primidone  50 mg Oral TID  . sodium chloride  3 mL Intravenous Q12H  . tamsulosin  0.4 mg Oral QPC breakfast  . warfarin  5 mg Oral ONCE-1800  . Warfarin - Pharmacist Dosing Inpatient   Does not apply q1800   Continuous Infusions:   Principal Problem:   Dizziness Active Problems:   Thrombocytopenia   Paroxysmal atrial fibrillation   Mobitz type II atrioventricular block   Essential hypertension   Chronic anticoagulation   Tachycardia-bradycardia syndrome   Chronic diastolic CHF (congestive heart failure)   UTI (urinary tract infection)   CKD (chronic kidney disease) stage 3, GFR 30-59 ml/min: Probable   BPPV (benign paroxysmal positional vertigo)    Time spent: 40 mins    [facility] MD [facility] Pager [phone]. If 7PM-7AM, please contact night-coverage at [email or web address], password [facility] 06/19/2015, 9:44 AM  LOS: 1 day

## 2015-06-19 NOTE — Progress Notes (Signed)
Physical Therapy Treatment Patient Details Name: Douglas Walker MRN: [id number] DOB: 1925/06/21 Today's Date: 06/19/2015    History of Present Illness Douglas Walker is a 81 y.o. male with history of hypertension, hyperlipidemia, coronary artery disease, prior history of TIA, paroxysmal atrial fibrillation on chronic Coumadin therapy, tachybradycardia syndrome with history of intermittent Mobitz type II AV block, who was recently hospitalized 05/19/2015 to 05/21/2015 with a syncopal episode, with L broken ribs, with concerns for cardiogenic etiology and patient subsequently discharged with a Holter monitor which was placed on 06/06/2015. Patient presents this visit with sudden onset of dizziness. Vision stated that one day prior to admission he felt fine all day had lunch with his nieces then subsequently went home his son came by to undo his Holter monitor so he could take a shower and Holter monitor was placed back on after that. Patient stated he ate dinner with his Worst television and subsequently went to bed around 10 PM. Vision states woke up around 2 AM to go to the bathroom however when he tried to get up he felt very swimmy headed and it took 5 attempts following to get up from a supine to a seated position. Patient states a family got up and went to the bathroom and subsequently went back to bed however when sitting on the side of the bed felt very dizzy and subsequently laid back down..     PT Comments    Discussed vestibular exam with evaluating physical therapist, which indicates L BPPV.  Performed Marye Round on L side with positive nystagmus, thus performed canalith repositioning technique (Epleys) thereafter.  Pt also reports "swimmy-headed" feeling upon looking downwards.  Pt provided with cues to maintain forward or upward gaze and focus on stationary distant object during mobility.  Encouraged pt to continue bed mobility to R side as well as keep head and neck in neutral  position with no sudden movement since performing technique.  Discussed with RN and pt not likely to d/c today so will check on progress tomorrow.  Pt also to start Meclizine as well.  Pt will likely need f/u vestibular PT.    Follow Up Recommendations  Supervision/Assistance - 24 hour;Home health PT (needs vestibular therapist)     Equipment Recommendations  None recommended by PT    Recommendations for Other Services       Precautions / Restrictions Precautions Precautions: Fall Precaution Comments: vertigo, R UE tremor    Mobility  Bed Mobility Overal bed mobility: Needs Assistance;+ 2 for safety/equipment Bed Mobility: Supine to Sit;Sit to Supine     Supine to sit: Min assist Sit to supine: Supervision   General bed mobility comments: assist for steadying trunk EOB  Transfers Overall transfer level: Needs assistance Equipment used: Rolling walker (2 wheeled) Transfers: Sit to/from Stand Sit to Stand: +2 safety/equipment;Min assist         General transfer comment: slight assist to steady with rise, verbal cues for safe technique, pt appears to become "swimmy-headed" upon looking downward so educated pt to keep eyes level or looking upward  Ambulation/Gait Ambulation/Gait assistance: Min assist;+2 safety/equipment Ambulation Distance (Feet): 140 Feet Assistive device: Rolling walker (2 wheeled) Gait Pattern/deviations: Step-through pattern;Decreased stride length     General Gait Details: verbal cues for looking straight ahead or upward gaze as well as focusing on stationary distant object to decrease dizziness, pt reports only occasional minimal dizziness which became better with previous mentioned cues.recliner following for safety, R UE tremoring (pt reports chronic)  also observed   Financial trader Rankin (Stroke Patients Only)       Balance                                    Cognition  Arousal/Alertness: Awake/alert Behavior During Therapy: WFL for tasks assessed/performed Overall Cognitive Status: Within Functional Limits for tasks assessed                      Exercises      General Comments        Pertinent Vitals/Pain Pain Assessment: No/denies pain    Home Living                      Prior Function            PT Goals (current goals can now be found in the care plan section) Progress towards PT goals: Progressing toward goals    Frequency  Min 3X/week    PT Plan Current plan remains appropriate    Co-evaluation             End of Session Equipment Utilized During Treatment: Gait belt Activity Tolerance: Patient tolerated treatment well Patient left: in bed;with call bell/phone within reach     Time: 1336-1408 PT Time Calculation (min) (ACUTE ONLY): 32 min  Charges:  $Gait Training: 8-22 mins $Canalith Rep Proc: 8-22 mins                    G Codes:      Yorel Redder,KATHrine E Jul 07, 2015, 2:26 PM Carmelia Bake, PT, DPT 07-Jul-2015 Pager: [phone]

## 2015-06-19 NOTE — Progress Notes (Signed)
OT Cancellation Note  Patient Details Name: Douglas Walker MRN: [id number] DOB: [birth year]   Cancelled Treatment:    Reason Eval/Treat Not Completed: Other (comment) Note dizziness issues. Will monitor how pt is responding to vestibular treatment for ability to participate in OOB ADL. Thanks.  [place], [place] 06/19/2015, 1:00 PM

## 2015-06-19 NOTE — Progress Notes (Signed)
ANTICOAGULATION CONSULT NOTE - Power for warfarin Indication: atrial fibrillation  Allergies  Allergen Reactions  . Sulfa Antibiotics Other (See Comments)    Weakness, dizziness    Patient Measurements: Height: 5\' 9"  (175.3 cm) Weight: 249 lb 9 oz (113.2 kg) IBW/kg (Calculated) : 70.7  Vital Signs: Temp: 97.8 F (36.6 C) (06/20 0525) Temp Source: Oral (06/20 0525) BP: 132/63 mmHg (06/20 0525) Pulse Rate: 59 (06/20 0525)  Labs:  Recent Labs  06/18/15 0404 06/18/15 0411 06/19/15 0417  HGB 12.3* 13.6 11.8*  HCT 38.0* 40.0 35.9*  PLT 129*  --  118*  APTT 38*  --   --   LABPROT 24.2*  --  24.9*  INR 2.19*  --  2.28*  CREATININE 1.58* 1.70* 1.47*    Estimated Creatinine Clearance: 43.9 mL/min (by C-G formula based on Cr of 1.47).   Medical History: Past Medical History  Diagnosis Date  . Hypertension   . Hyperlipemia   . Coronary artery disease   . TIA (transient ischemic attack)   . Hernia   . GERD (gastroesophageal reflux disease)   . Macular degeneration   . Paroxysmal atrial fibrillation 10/28/2013  . Tachycardia-bradycardia syndrome 09/13/2014    Medications:  Scheduled:  . cefTRIAXone (ROCEPHIN)  IV  1 g Intravenous Q24H  . docusate sodium  100 mg Oral BID  . DULoxetine  20 mg Oral BID  . gabapentin  100 mg Oral TID  . pantoprazole  40 mg Oral Q0600  . primidone  50 mg Oral TID  . sodium chloride  3 mL Intravenous Q12H  . tamsulosin  0.4 mg Oral QPC breakfast  . warfarin  5 mg Oral ONCE-1800  . Warfarin - Pharmacist Dosing Inpatient   Does not apply q1800    Assessment: 81 y/o M with PMH of HTN, hyperlipidemia, CAD, TIA, paroxysmal atrial fibrillation on chronic warfarin therapy, and tachycardia-bradycardia syndrome who presented to [facility] ED 6/19 for further evaluation of dizziness.  Pharmacy consulted to continue dosing warfarin for atrial fibrillation.  Home dose: 5 mg on Sun, Mon, Wed, Fri and 6.25 mg on Tues,  Thurs, Sat. Last dose PTA 6/18  Today, 06/19/2015:  INR remains therapeutic at 2.28  CBC: Hgb decreased to 11.8, Pltc 118 (appears chronically low)  No bleeding issues reported/documented  Cardiac diet ordered  No new significant drug interactions noted  Goal of Therapy:  INR 2-3    Plan:   Warfarin 5 mg PO x 1 today as per home regimen.  Daily PT/INR  Monitor for s/s of bleeding.   Lindell Spar, PharmD, BCPS Pager: [phone] 06/19/2015 8:46 AM

## 2015-06-19 NOTE — Care Management Note (Signed)
Case Management Note  Patient Details  Name: JAYCEON TROY MRN: [id number] Date of Birth: 1925-08-04  Subjective/Objective:   81 y/o m admitted w/dizziness. From home. PT-HHC.HHPT ordered.                 Action/Plan: d/c plan home w/HHPT.   Expected Discharge Date:                  Expected Discharge Plan:  [place]  In-House Referral:     Discharge planning Services  CM Consult  Post Acute Care Choice:    Choice offered to:  Patient  DME Arranged:    DME Agency:     HH Arranged:    [place] Agency:     Status of Service:  In process, will continue to follow  Medicare Important Message Given:    Date Medicare IM Given:    Medicare IM give by:    Date Additional Medicare IM Given:    Additional Medicare Important Message give by:     If discussed at [place] of Stay Meetings, dates discussed:    Additional Comments:  Dessa Phi, RN 06/19/2015, 1:27 PM

## 2015-06-19 NOTE — Progress Notes (Signed)
Patient Profile: 81 year old male with tachycardia-bradycardia syndrome, paroxysmal atrial fibrillation on Coumadin, history of first-degree AV block as well as second-degree AV block type II with 2-1 conduction, normal EF of 55-60% on recent echo 05/19/15, prior TIA hypertension, hyperlipidemia who was admitted 06/18/15 with weakness, near syncope, dizziness. Scheduled for OP EP consult 7/20 with Dr. Lovena Le to consider PPM.  Subjective: Still with dizziness. Worse with positional changes, especially moving head to the left side. No chest pain, dyspnea, syncope.  Objective: Vital signs in last 24 hours: Temp:  [97.6 F (36.4 C)-97.8 F (36.6 C)] 97.8 F (36.6 C) (06/20 0525) Pulse Rate:  [59-74] 59 (06/20 0525) Resp:  [16-18] 18 (06/20 0525) BP: (132-154)/(63-77) 132/63 mmHg (06/20 0525) SpO2:  [94 %-99 %] 94 % (06/20 0525) Weight:  [249 lb 9 oz (113.2 kg)] 249 lb 9 oz (113.2 kg) (06/20 0525) Last BM Date: 06/17/15  Intake/Output from previous day: 06/19 0701 - 06/20 0700 In: 1038.8 [P.O.:600; I.V.:388.8; IV Piggyback:50] Out: 0093 [Urine:1175] Intake/Output this shift:    Medications Current Facility-Administered Medications  Medication Dose Route Frequency Provider Last Rate Last Dose  . 0.9 %  sodium chloride infusion   Intravenous Continuous Eugenie Filler, MD 75 mL/hr at 06/18/15 2304 75 mL/hr at 06/18/15 2304  . acetaminophen (TYLENOL) tablet 1,000 mg  1,000 mg Oral Q6H PRN Irine Seal V, MD      . albuterol (PROVENTIL) (2.5 MG/3ML) 0.083% nebulizer solution 2.5 mg  2.5 mg Nebulization Q2H PRN Eugenie Filler, MD      . alum & mag hydroxide-simeth (MAALOX/MYLANTA) 200-200-20 MG/5ML suspension 30 mL  30 mL Oral Q6H PRN Eugenie Filler, MD      . cefTRIAXone (ROCEPHIN) 1 g in dextrose 5 % 50 mL IVPB - Premix  1 g Intravenous Q24H Eugenie Filler, MD   1 g at 06/18/15 0930  . docusate sodium (COLACE) capsule 100 mg  100 mg Oral BID Eugenie Filler, MD   100 mg  at 06/18/15 2145  . DULoxetine (CYMBALTA) DR capsule 20 mg  20 mg Oral BID Eugenie Filler, MD   20 mg at 06/18/15 2145  . gabapentin (NEURONTIN) capsule 100 mg  100 mg Oral TID Eugenie Filler, MD   100 mg at 06/18/15 2145  . HYDROcodone-acetaminophen (NORCO/VICODIN) 5-325 MG per tablet 1-2 tablet  1-2 tablet Oral Q4H PRN Eugenie Filler, MD   1 tablet at 06/19/15 0002  . ondansetron (ZOFRAN) tablet 4 mg  4 mg Oral Q6H PRN Eugenie Filler, MD       Or  . ondansetron [facility]) injection 4 mg  4 mg Intravenous Q6H PRN Eugenie Filler, MD      . pantoprazole (PROTONIX) EC tablet 40 mg  40 mg Oral Q0600 Eugenie Filler, MD   40 mg at 06/19/15 0558  . polyethylene glycol (MIRALAX / GLYCOLAX) packet 17 g  17 g Oral Daily PRN Eugenie Filler, MD      . primidone (MYSOLINE) tablet 50 mg  50 mg Oral TID Eugenie Filler, MD   50 mg at 06/18/15 2145  . sodium chloride 0.9 % injection 3 mL  3 mL Intravenous Q12H Eugenie Filler, MD   3 mL at 06/18/15 0930  . sorbitol 70 % solution 30 mL  30 mL Oral Daily PRN Irine Seal V, MD      . tamsulosin [facility]) capsule 0.4 mg  0.4 mg Oral QPC breakfast Irine Seal  V, MD   0.4 mg at 06/18/15 0929  . Warfarin - Pharmacist Dosing Inpatient   Does not apply q1800 Eugenie Filler, MD        PE: General appearance: alert, cooperative, no distress and mild resting tremor Neck: no carotid bruit and no JVD Lungs: clear to auscultation bilaterally Heart: regular rate and rhythm, S1, S2 normal, no murmur, click, rub or gallop Extremities: 1+ bilateral edema Pulses: 2+ and symmetric Skin: warm and dry Neurologic: Grossly normal  Lab Results:   Recent Labs  06/18/15 0404 06/18/15 0411 06/19/15 0417  WBC 5.7  --  6.2  HGB 12.3* 13.6 11.8*  HCT 38.0* 40.0 35.9*  PLT 129*  --  118*   BMET  Recent Labs  06/18/15 0404 06/18/15 0411 06/19/15 0417  NA 139 138 138  K 4.1 4.0 4.0  CL 103 102 105  CO2 28  --  27  GLUCOSE 102* 96 101*   BUN 23* 23* 21*  CREATININE 1.58* 1.70* 1.47*  CALCIUM 9.0  --  8.6*   PT/INR  Recent Labs  06/18/15 0404 06/19/15 0417  LABPROT 24.2* 24.9*  INR 2.19* 2.28*   Cardiac Panel (last 3 results) No results for input(s): CKTOTAL, CKMB, TROPONINI, RELINDX in the last 72 hours.  Orthostatic VS for the past 24 hrs:  BP- Lying BP- Sitting BP- Standing at 0 minutes  06/18/15 1819 134/65 mmHg 147/65 mmHg 130/68 mmHg     Assessment/Plan  Principal Problem:   Dizziness Active Problems:   Thrombocytopenia   Paroxysmal atrial fibrillation   Mobitz type II atrioventricular block   Essential hypertension   Chronic anticoagulation   Tachycardia-bradycardia syndrome   Chronic diastolic CHF (congestive heart failure)   UTI (urinary tract infection)   CKD (chronic kidney disease) stage 3, GFR 30-59 ml/min: Probable    1. Dizziness: orthostatic vital signs ok (see above). Telemetry reviewed. He has had 2-3 brief pauses in the last 12 hrs, the maximum being 2.53 sec, but no severe, persistent, bradycardia. Currently NSR with HR of 67 bpm. His symptoms seem more consistent with vestibular issues (exacerbated by head positional changes). Per consult note, he complained of active symptoms at time of Dr. Marlou Porch' evaluation and telemetry showed no bradycardia at the time. Agree with Dr. Marlou Porch, "Despite his significant underlying conduction disorder, not convinced that a pacemaker will rid him of his current symptoms". Recommend continuation of vestibular physical therapy. Continue with outpatient monitor and EP referral. Appt scheduled for 7/20 with Dr. Lovena Le. No AVN blocking agents.   2. Paroxysmal atrial fibrillation: rate well controlled. Continue warfarin for chronic anticoagulation.  INR therapeutic at 2.28.   3. Essential hypertension: well controlled. Not orthostatic. Continue Diovan HCTZ.   4. Chronic diastolic heart failure:  Stable, but does have mild lower extremity edema. He takes  PRN Lasix at home. Monitor and give PO lasix as needed.    LOS: 1 day    Brittainy M. Ladoris Gene 06/19/2015 7:46 AM  History and all data above reviewed.  Patient examined.  I agree with the findings as above.  He was dizzy again today.  However, he did not have any orthostatic BP drop.    The patient exam reveals COR:RRR  ,  Lungs: Clear  ,  Abd: Positive bowel sounds, no rebound no guarding, Ext No edema  .  All available labs, radiology testing, previous records reviewed. Agree with documented assessment and plan.   Dizziness:  No clear cardiac etiology.  No  plan for further in patient cardiac work up.  Please call us if there are further questions.  He has follow up with EP already scheduled.   Jeneen Rinks Martrice Apt  12:05 PM  06/19/2015

## 2015-06-19 NOTE — Care Management Note (Signed)
Case Management Note  Patient Details  Name: DELTA DESHMUKH MRN: [id number] Date of Birth: 1925-10-04  Subjective/Objective:     Per patient request spoke to son [name] on phone about d/c plans.HHPT recommended.[facility] chosen [employer] rep aware of referral, & order.               Action/Plan:d/c plan home w/HHPT.   Expected Discharge Date:                  Expected Discharge Plan:  [place]  In-House Referral:     Discharge planning Services  CM Consult  Post Acute Care Choice:    Choice offered to:  Patient  DME Arranged:    DME Agency:     HH Arranged:  PT [place]:  [place]  Status of Service:  In process, will continue to follow  Medicare Important Message Given:    Date Medicare IM Given:    Medicare IM give by:    Date Additional Medicare IM Given:    Additional Medicare Important Message give by:     If discussed at [place] of Stay Meetings, dates discussed:    Additional Comments:  Dessa Phi, RN 06/19/2015, 4:18 PM

## 2015-06-20 ENCOUNTER — Telehealth: Payer: Self-pay | Admitting: Interventional Cardiology

## 2015-06-20 DIAGNOSIS — H8113 Benign paroxysmal vertigo, bilateral: Secondary | ICD-10-CM

## 2015-06-20 LAB — CBC
HEMATOCRIT: 36.2 % — AB (ref 39.0–52.0)
Hemoglobin: 11.8 g/dL — ABNORMAL LOW (ref 13.0–17.0)
MCH: 31.2 pg (ref 26.0–34.0)
MCHC: 32.6 g/dL (ref 30.0–36.0)
MCV: 95.8 fL (ref 78.0–100.0)
Platelets: 116 10*3/uL — ABNORMAL LOW (ref 150–400)
RBC: 3.78 MIL/uL — ABNORMAL LOW (ref 4.22–5.81)
RDW: 14.1 % (ref 11.5–15.5)
WBC: 6.4 10*3/uL (ref 4.0–10.5)

## 2015-06-20 LAB — BASIC METABOLIC PANEL
Anion gap: 9 (ref 5–15)
BUN: 20 mg/dL (ref 6–20)
CO2: 28 mmol/L (ref 22–32)
Calcium: 8.7 mg/dL — ABNORMAL LOW (ref 8.9–10.3)
Chloride: 100 mmol/L — ABNORMAL LOW (ref 101–111)
Creatinine, Ser: 1.4 mg/dL — ABNORMAL HIGH (ref 0.61–1.24)
GFR calc Af Amer: 51 mL/min — ABNORMAL LOW (ref >60)
GFR, EST NON AFRICAN AMERICAN: 44 mL/min — AB (ref >60)
Glucose, Bld: 95 mg/dL (ref 65–99)
Potassium: 4 mmol/L (ref 3.5–5.1)
Sodium: 137 mmol/L (ref 135–145)

## 2015-06-20 LAB — PROTIME-INR
INR: 2.22 — AB (ref 0.00–1.49)
Prothrombin Time: 24.4 seconds — ABNORMAL HIGH (ref 11.6–15.2)

## 2015-06-20 MED ORDER — WARFARIN 1.25 MG HALF TABLET
6.2500 mg | ORAL_TABLET | Freq: Once | ORAL | Status: AC
  Administered 2015-06-20: 6.25 mg via ORAL
  Filled 2015-06-20: qty 1

## 2015-06-20 NOTE — Telephone Encounter (Signed)
New message      Douglas Walker wanted to make sure Dr Tamala Julian knew his dad was in the [place],  He has been at [facility] since sunday

## 2015-06-20 NOTE — Evaluation (Signed)
Occupational Therapy Evaluation Patient Details Name: BRICK KETCHER MRN: [id number] DOB: [birth year] Today's Date: 06/20/2015    History of Present Illness ARRON TETRAULT is a 81 y.o. male with history of hypertension, hyperlipidemia, coronary artery disease, prior history of TIA, paroxysmal atrial fibrillation on chronic Coumadin therapy, tachybradycardia syndrome with history of intermittent Mobitz type II AV block, who was recently hospitalized 05/19/2015 to 05/21/2015 with a syncopal episode, with L broken ribs, with concerns for cardiogenic etiology and patient subsequently discharged with a Holter monitor which was placed on 06/06/2015. Patient presents this visit with sudden onset of dizziness. Vision stated that one day prior to admission he felt fine all day had lunch with his nieces then subsequently went home his son came by to undo his Holter monitor so he could take a shower and Holter monitor was placed back on after that. Patient stated he ate dinner with his Worst television and subsequently went to bed around 10 PM. Vision states woke up around 2 AM to go to the bathroom however when he tried to get up he felt very swimmy headed and it took 5 attempts following to get up from a supine to a seated position. Patient states a family got up and went to the bathroom and subsequently went back to bed however when sitting on the side of the bed felt very dizzy and subsequently laid back down..    Clinical Impression   Pt up for functional ADL in room with supervision to min guard assist and walker. Educated extensively on safety with ADL considering his episodes of dizziness--including having tubseat in shower just in case he gets dizzy/start out sitting down, crossing LEs up instead of bending forward to don clothing and having supervision initially especially for showering. Will follow to progress ADL safety.    Follow Up Recommendations  No OT follow up;Supervision/Assistance - 24  hour (initial supervision for safety)    Equipment Recommendations  None recommended by OT    Recommendations for Other Services       Precautions / Restrictions Precautions Precautions: Fall Precaution Comments: vertigo, R UE tremor Restrictions Weight Bearing Restrictions: No      Mobility Bed Mobility         Supine to sit: Supervision Sit to supine: Supervision   General bed mobility comments: supervision for safety.  Transfers Overall transfer level: Needs assistance Equipment used: Rolling walker (2 wheeled) Transfers: Sit to/from Stand Sit to Stand: Supervision         General transfer comment: cues for hand placement.    Balance                                            ADL Overall ADL's : Needs assistance/impaired Eating/Feeding: Independent;Sitting   Grooming: Wash/dry hands;Min guard;Standing   Upper Body Bathing: Set up;Sitting   Lower Body Bathing: Min guard;Sit to/from stand   Upper Body Dressing : Set up;Sitting   Lower Body Dressing: Min guard;Sit to/from stand   Toilet Transfer: Min guard;Ambulation;Comfort height toilet;Grab bars;RW   Toileting- Water quality scientist and Hygiene: Min guard;Sit to/from stand         General ADL Comments: Pt not complaining of dizziness during session. Exited bed on R side as per PT recommendation for managing dizziness. Educated extensively on safety with ADL including having tubseat already in tub or very close by in case he  gets dizzy while showering that son can place seat in tub. Advised pt that it would be even safer to already have seat in tub but pt states it may be more in the way. So advised pt to at least have it handy for son to place inside tub quickly. Discussed having supervision for showering initially as well as crossing LEs up to don clothing rather than bending forward which can provoke dizziness. Discussed not going down flight of stairs to basement right now to do  laundry and allow son to assist.      Vision     Perception     Praxis      Pertinent Vitals/Pain Pain Assessment: No/denies pain     Hand Dominance     Extremity/Trunk Assessment Upper Extremity Assessment Upper Extremity Assessment: RUE deficits/detail RUE Deficits / Details: note tremor in R UE; strength and ROM WFL           Communication Communication Communication: No difficulties   Cognition Arousal/Alertness: Awake/alert Behavior During Therapy: WFL for tasks assessed/performed Overall Cognitive Status: Within Functional Limits for tasks assessed                     General Comments       Exercises       Shoulder Instructions      Home Living Family/patient expects to be discharged to:: Private residence Living Arrangements: Alone Available Help at Discharge: Family;Available PRN/intermittently (good family support with son) Type of Home: House Home Access: Stairs to enter [employer] of Steps: 2-3 Entrance Stairs-Rails: Right Home Layout: Two level;Able to live on main level with bedroom/bathroom;Laundry or work area in basement Alternate Therapist, sports of Steps: flight Alternate Level Stairs-Rails: Can reach both Bathroom Shower/Tub: Teacher, early years/pre: Handicapped height     Home Equipment: [place] - single point;Walker - 2 wheels;Bedside commode;Shower seat;Grab bars - tub/shower (all this equipment was for his wife)          Prior Functioning/Environment Level of Independence: Independent        Comments: occassionally uses wlaking stick when out int he yard. Does not drive anymore.     OT Diagnosis: Generalized weakness   OT Problem List: Decreased strength;Decreased knowledge of use of DME or AE   OT Treatment/Interventions: Self-care/ADL training;Patient/family education;Therapeutic activities;DME and/or AE instruction    OT Goals(Current goals can be found in the care plan section) Acute  Rehab OT Goals Patient Stated Goal: to return to independence. OT Goal Formulation: With patient Time For Goal Achievement: 07/04/15 Potential to Achieve Goals: Good  OT Frequency: Min 2X/week   Barriers to D/C:            Co-evaluation              End of Session Equipment Utilized During Treatment: Rolling walker  Activity Tolerance: Patient tolerated treatment well Patient left: in bed;with call bell/phone within reach;with bed alarm set   Time: 1015-1036 OT Time Calculation (min): 21 min Charges:  OT General Charges $OT Visit: 1 Procedure OT Evaluation $Initial OT Evaluation Tier I: 1 Procedure G-Codes:    Jules Schick  [phone] 06/20/2015, 12:09 PM

## 2015-06-20 NOTE — Telephone Encounter (Signed)
I advised pt's son that I cannot locate DPR so I am unable to discuss pt's medical condition with him. I advised son to ask nurse at hospital if she can help him,he said he would and thanked me for the call.

## 2015-06-20 NOTE — Progress Notes (Signed)
Physical Therapy Treatment Patient Details Name: Douglas Walker MRN: [id number] DOB: March 11, 1925 Today's Date: 06/20/2015    History of Present Illness Douglas Walker is a 81 y.o. male with history of hypertension, hyperlipidemia, coronary artery disease, prior history of TIA, paroxysmal atrial fibrillation on chronic Coumadin therapy, tachybradycardia syndrome with history of intermittent Mobitz type II AV block, who was recently hospitalized 05/19/2015 to 05/21/2015 with a syncopal episode, with L broken ribs, with concerns for cardiogenic etiology and patient subsequently discharged with a Holter monitor which was placed on 06/06/2015. Patient presents this visit with sudden onset of dizziness. Vision stated that one day prior to admission he felt fine all day had lunch with his nieces then subsequently went home his son came by to undo his Holter monitor so he could take a shower and Holter monitor was placed back on after that. Patient stated he ate dinner with his Worst television and subsequently went to bed around 10 PM. Vision states woke up around 2 AM to go to the bathroom however when he tried to get up he felt very swimmy headed and it took 5 attempts following to get up from a supine to a seated position. Patient states a family got up and went to the bathroom and subsequently went back to bed however when sitting on the side of the bed felt very dizzy and subsequently laid back down..     PT Comments    Pt reports dizziness improved since yesterday.  Retested with modified Dix-Hallpike (sidelying test) to Right side (negative) and Left side (positive upbeating nystagmus - non-rotational today which only lasted approx 15 sec).  From Left Modified Dix Hallpike, performed Semont maneuver.  Pt then assisted with ambulation which was much improved compared to yesterday and pt denied dizziness.  Will need to provide Semont handout and review technique with pt prior to d/c.   Follow Up  Recommendations  Home health PT;Supervision - Intermittent (needs vestibular PT)     Equipment Recommendations  None recommended by PT    Recommendations for Other Services       Precautions / Restrictions Precautions Precautions: Fall Precaution Comments: vertigo, R UE tremor Restrictions Weight Bearing Restrictions: No    Mobility  Bed Mobility Overal bed mobility: Needs Assistance Bed Mobility: Supine to Sit     Supine to sit: Supervision Sit to supine: Supervision   General bed mobility comments: supervision for safety.  Transfers Overall transfer level: Needs assistance Equipment used: Rolling walker (2 wheeled) Transfers: Sit to/from Stand Sit to Stand: Supervision         General transfer comment: verbal cues for hand placement  Ambulation/Gait Ambulation/Gait assistance: Min guard Ambulation Distance (Feet): 400 Feet Assistive device: Rolling walker (2 wheeled) Gait Pattern/deviations: Step-through pattern;Decreased stride length Gait velocity: improved pace compared to yesterday   General Gait Details: only one verbal cue for looking straight ahead or upward gaze as well as focusing on stationary distant object to decrease dizziness, pt reports no dizziness during ambulation today   Stairs            Wheelchair Mobility    Modified Rankin (Stroke Patients Only)       Balance                                    Cognition Arousal/Alertness: Awake/alert Behavior During Therapy: WFL for tasks assessed/performed Overall Cognitive Status: Within Functional Limits for  tasks assessed                      Exercises      General Comments        Pertinent Vitals/Pain Pain Assessment: No/denies pain    Home Living Family/patient expects to be discharged to:: Private residence Living Arrangements: Alone Available Help at Discharge: Family;Available PRN/intermittently (good family support with son) Type of Home:  House Home Access: Stairs to enter Entrance Stairs-Rails: Right Home Layout: Two level;Able to live on main level with bedroom/bathroom;Laundry or work area in [place]: Kasandra Knudsen - single point;Walker - 2 wheels;Bedside commode;Shower seat;Grab bars - tub/shower (all this equipment was for his wife)      Prior Function Level of Independence: Independent      Comments: occassionally uses wlaking stick when out int he yard. Does not drive anymore.    PT Goals (current goals can now be found in the care plan section) Acute Rehab PT Goals Patient Stated Goal: to return to independence. Progress towards PT goals: Progressing toward goals    Frequency  Min 3X/week    PT Plan Current plan remains appropriate    Co-evaluation             End of Session Equipment Utilized During Treatment: Gait belt Activity Tolerance: Patient tolerated treatment well Patient left: in bed;with call bell/phone within reach     Time: 1145-1210 PT Time Calculation (min) (ACUTE ONLY): 25 min  Charges:  $Gait Training: 8-22 mins $Canalith Rep Proc: 8-22 mins                    G Codes:      Kemi Gell,KATHrine E 27-Jun-2015, 2:00 PM Carmelia Bake, PT, DPT 06/27/2015 Pager: [phone]

## 2015-06-20 NOTE — Progress Notes (Signed)
ANTICOAGULATION CONSULT NOTE - [place] for warfarin Indication: atrial fibrillation  Allergies  Allergen Reactions  . Sulfa Antibiotics Other (See Comments)    Weakness, dizziness    Patient Measurements: Height: 5\' 9"  (175.3 cm) Weight: 248 lb 10.9 oz (112.8 kg) IBW/kg (Calculated) : 70.7  Vital Signs: Temp: 97.6 F (36.4 C) (06/21 0504) Temp Source: Oral (06/21 0504) BP: 130/65 mmHg (06/21 0504) Pulse Rate: 55 (06/21 0504)  Labs:  Recent Labs  06/18/15 0404 06/18/15 0411 06/19/15 0417 06/20/15 0413  HGB 12.3* 13.6 11.8*  --   HCT 38.0* 40.0 35.9*  --   PLT 129*  --  118*  --   APTT 38*  --   --   --   LABPROT 24.2*  --  24.9* 24.4*  INR 2.19*  --  2.28* 2.22*  CREATININE 1.58* 1.70* 1.47*  --     Estimated Creatinine Clearance: 43.8 mL/min (by C-G formula based on Cr of 1.47).   Medical History: Past Medical History  Diagnosis Date  . Hypertension   . Hyperlipemia   . Coronary artery disease   . TIA (transient ischemic attack)   . Hernia   . GERD (gastroesophageal reflux disease)   . Macular degeneration   . Paroxysmal atrial fibrillation 10/28/2013  . Tachycardia-bradycardia syndrome 09/13/2014    Medications:  Scheduled:  . docusate sodium  100 mg Oral BID  . DULoxetine  20 mg Oral BID  . gabapentin  100 mg Oral TID  . meclizine  25 mg Oral TID  . pantoprazole  40 mg Oral Q0600  . primidone  50 mg Oral TID  . sodium chloride  3 mL Intravenous Q12H  . tamsulosin  0.4 mg Oral QPC breakfast  . warfarin  6.25 mg Oral ONCE-1800  . Warfarin - Pharmacist Dosing Inpatient   Does not apply q1800    Assessment: 81 y/o M with PMH of HTN, hyperlipidemia, CAD, TIA, paroxysmal atrial fibrillation on chronic warfarin therapy, and tachycardia-bradycardia syndrome who presented to [facility] ED 6/19 for further evaluation of dizziness.  Pharmacy consulted to continue dosing warfarin for atrial fibrillation.  Home dose: 5 mg on Sun, Mon,  Wed, Fri and 6.25 mg on Tues, Thurs, Sat. Last dose PTA 6/18  Today, 06/20/2015:  INR remains therapeutic at 2.22  CBC: Hgb decreased to 11.8 (6/20), Pltc 118 (appears chronically low)  No bleeding issues reported/documented  Cardiac diet ordered  No new significant drug interactions noted  Goal of Therapy:  INR 2-3    Plan:   Warfarin 6.25 mg PO x 1 today as per home regimen.  Daily PT/INR  Monitor for s/s of bleeding.    Lindell Spar, PharmD, BCPS Pager: [phone] 06/20/2015 7:58 AM

## 2015-06-20 NOTE — Progress Notes (Signed)
[facility] PROGRESS NOTE  Douglas Walker RDE:[id number] DOB: Sep 09, 1925 DOA: 06/18/2015 PCP: Henrine Screws, MD  Assessment/Plan: #1 dizziness secondary to BPPV Likely multifactorial. May likely be secondary to BPPV as patient was noted to have a positive evaluation per PT. Patient with vestibular exercises yesterday and today. Patient states improvement with his dizziness to the point is able to ambulate without looking down. Continue meclizine 25 mg by mouth 3 times a day. PT following. Patient also noted to have 2-3 brief pauses yesterday. Patient has been assessed by cardiology who are not convinced that pacemaker will resolve his symptoms. Per cardiology patient has an appointment with EP on 07/19/2015 and is to follow-up at that time. No AV nodal blocking agents. Follow.  #2 paroxysmal atrial fibrillation/tachybradycardia syndrome/intermittent Mobitz type II AV block Currently rate controlled. Continue Coumadin for anticoagulation.  #3 hypertension Stable. Continue Flomax.  #4 bacteriuria Urine cultures negative. DC IV Rocephin.   #5 chronic diastolic CHF Stable. Unable to place patient on a beta blocker secondary to problem #2. Patient has been seen by cardiology and recommend outpatient follow-up.  #6 probable chronic kidney disease stage III Stable.  #7 prophylaxis PPI for GI prophylaxis. On Coumadin for DVT prophylaxis.   Code Status: DO NOT RESUSCITATE Family Communication: Updated patient no family present. Disposition Plan:  Home when dizziness has improved hopefully tomorrow,   Consultants:  Cardiology: Dr. Marlou Porch 06/18/2015  Procedures:  CT head 06/18/2015    Antibiotics:  IV Rocephin 06/18/2015>>>>> 06/20/2015  HPI/Subjective: Patient states dizziness improving with head exercises been done by physical therapy. Patient is very impressed with physical therapies head exercises and treatments.  Objective: Filed Vitals:   06/20/15 1302   BP: 127/66  Pulse: 72  Temp: 98.5 F (36.9 C)  Resp: 18    Intake/Output Summary (Last 24 hours) at 06/20/15 1915 Last data filed at 06/20/15 1747  Gross per 24 hour  Intake   1320 ml  Output   2000 ml  Net   -680 ml   Filed Weights   06/18/15 0700 06/19/15 0525 06/20/15 0504  Weight: 112.2 kg (247 lb 5.7 oz) 113.2 kg (249 lb 9 oz) 112.8 kg (248 lb 10.9 oz)    Exam:   General:  NAD  Cardiovascular: RRR  Respiratory: CTAB  Abdomen: Soft/NT/ND/+BS  Musculoskeletal: No c/c/e  Data Reviewed: Basic Metabolic Panel:  Recent Labs Lab 06/18/15 0404 06/18/15 0411 06/19/15 0417 06/20/15 0413  NA 139 138 138 137  K 4.1 4.0 4.0 4.0  CL 103 102 105 100*  CO2 28  --  27 28  GLUCOSE 102* 96 101* 95  BUN 23* 23* 21* 20  CREATININE 1.58* 1.70* 1.47* 1.40*  CALCIUM 9.0  --  8.6* 8.7*  MG 1.9  --   --   --    Liver Function Tests: No results for input(s): AST, ALT, ALKPHOS, BILITOT, PROT, ALBUMIN in the last 168 hours. No results for input(s): LIPASE, AMYLASE in the last 168 hours. No results for input(s): AMMONIA in the last 168 hours. CBC:  Recent Labs Lab 06/18/15 0404 06/18/15 0411 06/19/15 0417 06/20/15 0413  WBC 5.7  --  6.2 6.4  HGB 12.3* 13.6 11.8* 11.8*  HCT 38.0* 40.0 35.9* 36.2*  MCV 96.4  --  96.5 95.8  PLT 129*  --  118* 116*   Cardiac Enzymes: No results for input(s): CKTOTAL, CKMB, CKMBINDEX, TROPONINI in the last 168 hours. BNP (last 3 results) No results for input(s): BNP in the last  8760 hours.  ProBNP (last 3 results) No results for input(s): PROBNP in the last 8760 hours.  CBG:  Recent Labs Lab 06/18/15 0405  GLUCAP 92    Recent Results (from the past 240 hour(s))  Culture, Urine     Status: None   Collection Time: 06/18/15  3:30 AM  Result Value Ref Range Status   Specimen Description URINE, CLEAN CATCH  Final   Special Requests NONE  Final   Culture   Final    <10,000 COLONIES/mL INSIGNIFICANT GROWTH Performed at [facility]    Report Status 06/19/2015 FINAL  Final     Studies: No results found.  Scheduled Meds: . docusate sodium  100 mg Oral BID  . DULoxetine  20 mg Oral BID  . gabapentin  100 mg Oral TID  . meclizine  25 mg Oral TID  . pantoprazole  40 mg Oral Q0600  . primidone  50 mg Oral TID  . sodium chloride  3 mL Intravenous Q12H  . tamsulosin  0.4 mg Oral QPC breakfast  . Warfarin - Pharmacist Dosing Inpatient   Does not apply q1800   Continuous Infusions:   Principal Problem:   Dizziness Active Problems:   BPPV (benign paroxysmal positional vertigo)   Thrombocytopenia   Paroxysmal atrial fibrillation   Mobitz type II atrioventricular block   Essential hypertension   Chronic anticoagulation   Tachycardia-bradycardia syndrome   Chronic diastolic CHF (congestive heart failure)   Bacteria in urine   CKD (chronic kidney disease) stage 3, GFR 30-59 ml/min: Probable    Time spent: 40 mins    [facility] MD [facility] Pager [phone]. If 7PM-7AM, please contact night-coverage at [email or web address], password [facility] 06/20/2015, 7:15 PM  LOS: 2 days

## 2015-06-21 LAB — BASIC METABOLIC PANEL
Anion gap: 9 (ref 5–15)
BUN: 23 mg/dL — ABNORMAL HIGH (ref 6–20)
CO2: 29 mmol/L (ref 22–32)
CREATININE: 1.39 mg/dL — AB (ref 0.61–1.24)
Calcium: 9 mg/dL (ref 8.9–10.3)
Chloride: 101 mmol/L (ref 101–111)
GFR calc Af Amer: 51 mL/min — ABNORMAL LOW (ref >60)
GFR calc non Af Amer: 44 mL/min — ABNORMAL LOW (ref >60)
Glucose, Bld: 99 mg/dL (ref 65–99)
Potassium: 4.1 mmol/L (ref 3.5–5.1)
Sodium: 139 mmol/L (ref 135–145)

## 2015-06-21 LAB — CBC
HCT: 37.3 % — ABNORMAL LOW (ref 39.0–52.0)
HEMOGLOBIN: 12.2 g/dL — AB (ref 13.0–17.0)
MCH: 31.2 pg (ref 26.0–34.0)
MCHC: 32.7 g/dL (ref 30.0–36.0)
MCV: 95.4 fL (ref 78.0–100.0)
Platelets: 114 10*3/uL — ABNORMAL LOW (ref 150–400)
RBC: 3.91 MIL/uL — ABNORMAL LOW (ref 4.22–5.81)
RDW: 14.1 % (ref 11.5–15.5)
WBC: 6.6 10*3/uL (ref 4.0–10.5)

## 2015-06-21 LAB — PROTIME-INR
INR: 2.17 — ABNORMAL HIGH (ref 0.00–1.49)
Prothrombin Time: 24 seconds — ABNORMAL HIGH (ref 11.6–15.2)

## 2015-06-21 MED ORDER — WARFARIN SODIUM 5 MG PO TABS
5.0000 mg | ORAL_TABLET | Freq: Once | ORAL | Status: DC
  Filled 2015-06-21: qty 1

## 2015-06-21 MED ORDER — MECLIZINE HCL 25 MG PO TABS: 25.0000 mg | ORAL_TABLET | Freq: Three times a day (TID) | ORAL | Status: AC | PRN

## 2015-06-21 NOTE — Care Management Note (Signed)
Case Management Note  Patient Details  Name: WILTON THRALL MRN: [id number] Date of Birth: Apr 02, 1925  Subjective/Objective:                    Action/Plan:d/c home w/HHC.AHC. No further d/c needs.   Expected Discharge Date:                  Expected Discharge Plan:  [place]  In-House Referral:     Discharge planning Services  CM Consult  Post Acute Care Choice:    Choice offered to:  Patient  DME Arranged:    DME Agency:     HH Arranged:  PT, OT, RN, Nurse's Aide [place] Agency:  [place]  Status of Service:  Completed, signed off  Medicare Important Message Given:  Yes Date Medicare IM Given:  06/21/15 Medicare IM give by:  Dessa Phi Date Additional Medicare IM Given:    Additional Medicare Important Message give by:     If discussed at [place] of Stay Meetings, dates discussed:    Additional Comments:  Dessa Phi, RN 06/21/2015, 9:28 AM

## 2015-06-21 NOTE — Discharge Instructions (Signed)
Follow with Primary MD GATES,ROBERT NEVILL, MD in 7 days  Continue to follow with warfarin clinic Get CBC, CMP, 2 view Chest X ray checked  by Primary MD next visit.    Activity: As tolerated with Full fall precautions use walker/cane & assistance as needed   Disposition Home    Diet: Heart Healthy  , with feeding assistance and aspiration precautions.  For Heart failure patients - Check your Weight same time everyday, if you gain over 2 pounds, or you develop in leg swelling, experience more shortness of breath or chest pain, call your Primary MD immediately. Follow Cardiac Low Salt Diet and 1.5 lit/day fluid restriction.   On your next visit with your primary care physician please Get Medicines reviewed and adjusted.   Please request your Prim.MD to go over all Hospital Tests and Procedure/Radiological results at the follow up, please get all Hospital records sent to your Prim MD by signing hospital release before you go home.   If you experience worsening of your admission symptoms, develop shortness of breath, life threatening emergency, suicidal or homicidal thoughts you must seek medical attention immediately by calling 911 or calling your MD immediately  if symptoms less severe.  You Must read complete instructions/literature along with all the possible adverse reactions/side effects for all the Medicines you take and that have been prescribed to you. Take any new Medicines after you have completely understood and accpet all the possible adverse reactions/side effects.   Do not drive, operating heavy machinery, perform activities at heights, swimming or participation in water activities or provide baby sitting services if your were admitted for syncope or siezures until you have seen by Primary MD or a Neurologist and advised to do so again.  Do not drive when taking Pain medications.    Do not take more than prescribed Pain, Sleep and Anxiety Medications  Special  Instructions: If you have smoked or chewed Tobacco  in the last 2 yrs please stop smoking, stop any regular Alcohol  and or any Recreational drug use.  Wear Seat belts while driving.   Please note  You were cared for by a hospitalist during your hospital stay. If you have any questions about your discharge medications or the care you received while you were in the hospital after you are discharged, you can call the unit and asked to speak with the hospitalist on call if the hospitalist that took care of you is not available. Once you are discharged, your primary care physician will handle any further medical issues. Please note that NO REFILLS for any discharge medications will be authorized once you are discharged, as it is imperative that you return to your primary care physician (or establish a relationship with a primary care physician if you do not have one) for your aftercare needs so that they can reassess your need for medications and monitor your lab values.

## 2015-06-21 NOTE — Progress Notes (Signed)
Physical Therapy Treatment Patient Details Name: KEVIS QU MRN: [id number] DOB: 20-May-1925 Today's Date: 06/21/2015    History of Present Illness Douglas Walker is a 81 y.o. male with history of hypertension, hyperlipidemia, coronary artery disease, prior history of TIA, paroxysmal atrial fibrillation on chronic Coumadin therapy, tachybradycardia syndrome with history of intermittent Mobitz type II AV block, who was recently hospitalized 05/19/2015 to 05/21/2015 with a syncopal episode, with L broken ribs, with concerns for cardiogenic etiology and patient subsequently discharged with a Holter monitor which was placed on 06/06/2015. Patient presents this visit with sudden onset of dizziness. Vision stated that one day prior to admission he felt fine all day had lunch with his nieces then subsequently went home his son came by to undo his Holter monitor so he could take a shower and Holter monitor was placed back on after that. Patient stated he ate dinner with his Worst television and subsequently went to bed around 10 PM. Vision states woke up around 2 AM to go to the bathroom however when he tried to get up he felt very swimmy headed and it took 5 attempts following to get up from a supine to a seated position. Patient states a family got up and went to the bathroom and subsequently went back to bed however when sitting on the side of the bed felt very dizzy and subsequently laid back down..     PT Comments    Performed sidelying test again today with positive rotational nystagmus L side so performed semont maneuver.  Pt educated to perform Semont and able to verbalize back to PT correct technique to perform at home.  Pt seemed hesitant about maneuver so recommended he perform with another person to supervise for safety and provided handout (pt unable to read due to macular degeneration but provided for second person and/or next therapist for reference).  Pt reports overall improvement  since admission however still presenting with dizziness with bed mobility to Left side and occasionally when looking downwards.  Also reviewed avoiding these positions which cause dizziness for safety at home.  Pt uncertain of d/c today or tomorrow however if remains will check back tomorrow.   Follow Up Recommendations  Home health PT;Supervision - Intermittent (needs vestibular PT)     Equipment Recommendations  None recommended by PT    Recommendations for Other Services       Precautions / Restrictions Precautions Precautions: Fall Precaution Comments: vertigo, R UE tremor Restrictions Weight Bearing Restrictions: No    Mobility  Bed Mobility               General bed mobility comments: pt sitting EOB on arrival  Transfers Overall transfer level: Needs assistance Equipment used: Rolling walker (2 wheeled) Transfers: Sit to/from Stand Sit to Stand: Supervision         General transfer comment: verbal cues for hand placement  Ambulation/Gait Ambulation/Gait assistance: Min guard;Supervision Ambulation Distance (Feet): 400 Feet Assistive device: Rolling walker (2 wheeled) Gait Pattern/deviations: Step-through pattern     General Gait Details: pt able to recall cues for looking straight ahead or upward gaze as well as focusing on stationary distant object to decrease dizziness from yesterday, pt reports no dizziness during ambulation today   Stairs            Wheelchair Mobility    Modified Rankin (Stroke Patients Only)       Balance  Cognition Arousal/Alertness: Awake/alert Behavior During Therapy: WFL for tasks assessed/performed Overall Cognitive Status: Within Functional Limits for tasks assessed                      Exercises      General Comments        Pertinent Vitals/Pain Pain Assessment: No/denies pain    Home Living                      Prior Function             PT Goals (current goals can now be found in the care plan section) Progress towards PT goals: Progressing toward goals    Frequency  Min 3X/week    PT Plan Current plan remains appropriate    Co-evaluation             End of Session Equipment Utilized During Treatment: Gait belt Activity Tolerance: Patient tolerated treatment well Patient left: in bed;with call bell/phone within reach (returned to sitting EOB)     Time: 9518-8416 PT Time Calculation (min) (ACUTE ONLY): 32 min  Charges:  $Gait Training: 8-22 mins $Canalith Rep Proc: 8-22 mins                    G Codes:      Jakim Drapeau,KATHrine E 07-18-15, 2:24 PM Carmelia Bake, PT, DPT 07-18-15 Pager: [phone]

## 2015-06-21 NOTE — Progress Notes (Signed)
ANTICOAGULATION CONSULT NOTE - [place] for warfarin Indication: atrial fibrillation  Allergies  Allergen Reactions  . Sulfa Antibiotics Other (See Comments)    Weakness, dizziness    Patient Measurements: Height: 5\' 9"  (175.3 cm) Weight: 245 lb 6 oz (111.3 kg) IBW/kg (Calculated) : 70.7  Vital Signs: Temp: 98.4 F (36.9 C) (06/22 0527) Temp Source: Oral (06/22 0527) BP: 127/65 mmHg (06/22 0527) Pulse Rate: 62 (06/22 0527)  Labs:  Recent Labs  06/19/15 0417 06/20/15 0413 06/21/15 0425  HGB 11.8* 11.8* 12.2*  HCT 35.9* 36.2* 37.3*  PLT 118* 116* 114*  LABPROT 24.9* 24.4* 24.0*  INR 2.28* 2.22* 2.17*  CREATININE 1.47* 1.40* 1.39*    Estimated Creatinine Clearance: 46 mL/min (by C-G formula based on Cr of 1.39).   Medical History: Past Medical History  Diagnosis Date  . Hypertension   . Hyperlipemia   . Coronary artery disease   . TIA (transient ischemic attack)   . Hernia   . GERD (gastroesophageal reflux disease)   . Macular degeneration   . Paroxysmal atrial fibrillation 10/28/2013  . Tachycardia-bradycardia syndrome 09/13/2014    Medications:  Scheduled:  . docusate sodium  100 mg Oral BID  . DULoxetine  20 mg Oral BID  . gabapentin  100 mg Oral TID  . meclizine  25 mg Oral TID  . pantoprazole  40 mg Oral Q0600  . primidone  50 mg Oral TID  . sodium chloride  3 mL Intravenous Q12H  . tamsulosin  0.4 mg Oral QPC breakfast  . Warfarin - Pharmacist Dosing Inpatient   Does not apply q1800    Assessment: 81 y/o M with PMH of HTN, hyperlipidemia, CAD, TIA, paroxysmal atrial fibrillation on chronic warfarin therapy, and tachycardia-bradycardia syndrome who presented to [facility] ED 6/19 for further evaluation of dizziness.  Pharmacy consulted to continue dosing warfarin for atrial fibrillation.  Home dose: 5 mg on Sun, Mon, Wed, Fri and 6.25 mg on Tues, Thurs, Sat. Last dose PTA 6/18  Today, 06/21/2015:  INR remains therapeutic  at 2.17  CBC: Hgb stable at 12.2, Pltc 114 (appears chronically low)  No bleeding issues reported/documented  Cardiac diet ordered  No new significant drug interactions noted  Goal of Therapy:  INR 2-3    Plan:   Warfarin 5 mg PO x 1 today as per home regimen.  Daily PT/INR  Monitor for s/s of bleeding.    Lindell Spar, PharmD, BCPS Pager: [phone] 06/21/2015 11:29 AM

## 2015-06-21 NOTE — Discharge Summary (Signed)
Douglas Walker, is a 81 y.o. male  DOB 05-Jan-1925  MRN [id number].  Admission date:  06/18/2015  Admitting Physician  Ivor Costa, MD  Discharge Date:  06/21/2015   Primary MD  Henrine Screws, MD  Recommendations for primary care physician for things to follow:  - Check basic labs including CBC, BMP due to next visit - Home care has been arranged with vestibular PT therapy   Admission Diagnosis  Dizziness [R42]   Discharge Diagnosis  Dizziness [R42]    Principal Problem:   Dizziness Active Problems:   Thrombocytopenia   Paroxysmal atrial fibrillation   Mobitz type II atrioventricular block   Essential hypertension   Chronic anticoagulation   Tachycardia-bradycardia syndrome   Chronic diastolic CHF (congestive heart failure)   Bacteria in urine   CKD (chronic kidney disease) stage 3, GFR 30-59 ml/min: Probable   BPPV (benign paroxysmal positional vertigo)      Past Medical History  Diagnosis Date  . Hypertension   . Hyperlipemia   . Coronary artery disease   . TIA (transient ischemic attack)   . Hernia   . GERD (gastroesophageal reflux disease)   . Macular degeneration   . Paroxysmal atrial fibrillation 10/28/2013  . Tachycardia-bradycardia syndrome 09/13/2014    Past Surgical History  Procedure Laterality Date  . Hernia repair    . Appendectomy    . Hernia repair         History of present illness and  Hospital Course:     Kindly see H&P for history of present illness and admission details, please review complete Labs, Consult reports and Test reports for all details in brief  HPI  from the history and physical done on the day of admission  Douglas Walker is a 81 y.o. male  With history of hypertension, hyperlipidemia, coronary artery disease, prior history of TIA, paroxysmal atrial fibrillation on chronic Coumadin therapy, tachybradycardia syndrome with  history of intermittent Mobitz type II AV block, who was recently hospitalized 05/19/2015 to 05/21/2015 with a syncopal episode with concerns for cardiogenic etiology and patient subsequently discharged with a Holter monitor which was placed on 06/06/2015. Patient presents to the ED with sudden onset of dizziness. Vision stated that one day prior to admission he felt fine all day had lunch with his nieces then subsequently went home his son came by to undo his Holter monitor so he could take a shower and Holter monitor was placed back on after that. Patient stated he ate dinner with his Worst television and subsequently went to bed around 10 PM. Vision states woke up around 2 AM to go to the bathroom however when he tried to get up he felt very swimmy headed and it took 5 attempts following to get up from a supine to a seated position. Patient states a family got up and went to the bathroom and subsequently went back to bed however when sitting on the side of the bed felt very dizzy and subsequently laid back down. Patient denies any syncopal  events. Patient denies any spinning sensations. Patient subsequently called his son who came by and 911 was called and patient was brought to the emergency room. Patient does endorse some generalized weakness, dysuria. Patient denies any chest pain, no shortness of breath, no fever, no chills, no nausea, no vomiting, no diaphoresis, no abdominal pain, no melanoma, no hematemesis, no hematochezia, no cough, no other associated symptoms. Patient does state a Holter monitor was placed on 06/06/2015 and is 2, off on 07/06/2015. Patient was seen in the emergency room. EKG was not done. Basic metabolic profile are the BUN of 23 creatinine of 1.58 and subsequently 1.70 otherwise was within normal limits. CBC had a platelet count of 129 otherwise was within normal limits. INR was 2.19. CT head was negative. [facility] were consulted to admit the patient for further  evaluation and management.    Hospital Course   #1 dizziness secondary to BPPV Likely multifactorial. May likely be secondary to BPPV as patient was noted to have a positive evaluation per PT. Patient with vestibular exercises during hospital stay. Patient states significant improvement with his dizziness to the point is able to ambulate .  shunt will continue with vestibular PT at home , kidney with meclizine at home . - Patient also noted to have 2-3 brief pauses on telemetry, he has been assessed by cardiology who are not convinced that pacemaker will resolve his symptoms. Per cardiology patient has an appointment with EP on 07/19/2015 and is to follow-up at that time. No AV nodal blocking agents.   #2 paroxysmal atrial fibrillation/tachybradycardia syndrome/intermittent Mobitz type II AV block Currently rate controlled. Continue Coumadin for anticoagulation.  #3 hypertensioresume home medication  #4 bacteriuria Urine cultures negative. stopped IV Rocephin.   #5 chronic diastolic CHF Stable. Unable to place patient on a beta blocker secondary to problem #2. Patient has been seen by cardiology and recommend outpatient follow-up.   #6 probable chronic kidney disease stage III Stable.creatinine is 1.39 at day of discharge       Consultants:  Cardiology: Dr. Marlou Porch 06/18/2015  Procedures:  CT head 06/18/2015    Antibiotics:  IV Rocephin 06/18/2015>>>>> 06/20/2015   Discharge Condition: stable   Follow UP  Follow-up Information    Follow up with [facility].   Why:  [facility]   Contact information:   [address] [place] [address] [phone]       Follow up with GATES,ROBERT NEVILL, MD. Schedule an appointment as soon as possible for a visit in 1 week.   Specialty:  Internal Medicine   Why:  Posthospitalization follow-up   Contact information:   [address] [place] [address] [phone]          Discharge Instructions  and  Discharge Medications         Discharge Instructions    Diet - low sodium heart healthy    Complete by:  As directed      Discharge instructions    Complete by:  As directed   Follow with Primary MD GATES,ROBERT NEVILL, MD in 7 days  Continue to follow with your warfarin clinic Get CBC, CMP, 2 view Chest X ray checked  by Primary MD next visit.    Activity: As tolerated with Full fall precautions use walker/cane & assistance as needed   Disposition Home    Diet: Heart Healthy  , with feeding assistance and aspiration precautions.  For Heart failure patients - Check your Weight same  time everyday, if you gain over 2 pounds, or you develop in leg swelling, experience more shortness of breath or chest pain, call your Primary MD immediately. Follow Cardiac Low Salt Diet and 1.5 lit/day fluid restriction.   On your next visit with your primary care physician please Get Medicines reviewed and adjusted.   Please request your Prim.MD to go over all Hospital Tests and Procedure/Radiological results at the follow up, please get all Hospital records sent to your Prim MD by signing hospital release before you go home.   If you experience worsening of your admission symptoms, develop shortness of breath, life threatening emergency, suicidal or homicidal thoughts you must seek medical attention immediately by calling 911 or calling your MD immediately  if symptoms less severe.  You Must read complete instructions/literature along with all the possible adverse reactions/side effects for all the Medicines you take and that have been prescribed to you. Take any new Medicines after you have completely understood and accpet all the possible adverse reactions/side effects.   Do not drive, operating heavy machinery, perform activities at heights, swimming or participation in water activities or provide baby sitting services if your were admitted for syncope or  siezures until you have seen by Primary MD or a Neurologist and advised to do so again.  Do not drive when taking Pain medications.    Do not take more than prescribed Pain, Sleep and Anxiety Medications  Special Instructions: If you have smoked or chewed Tobacco  in the last 2 yrs please stop smoking, stop any regular Alcohol  and or any Recreational drug use.  Wear Seat belts while driving.   Please note  You were cared for by a hospitalist during your hospital stay. If you have any questions about your discharge medications or the care you received while you were in the hospital after you are discharged, you can call the unit and asked to speak with the hospitalist on call if the hospitalist that took care of you is not available. Once you are discharged, your primary care physician will handle any further medical issues. Please note that NO REFILLS for any discharge medications will be authorized once you are discharged, as it is imperative that you return to your primary care physician (or establish a relationship with a primary care physician if you do not have one) for your aftercare needs so that they can reassess your need for medications and monitor your lab values.     Increase activity slowly    Complete by:  As directed             Medication List    TAKE these medications        acetaminophen 500 MG tablet  Commonly known as:  TYLENOL  Take 1,000 mg by mouth every 6 (six) hours as needed for mild pain.     beta carotene w/minerals tablet  Take 2 tablets by mouth once.     DULoxetine 20 MG capsule  Commonly known as:  CYMBALTA  Take 20 mg by mouth 2 (two) times daily.     gabapentin 100 MG capsule  Commonly known as:  NEURONTIN  Take 100 mg by mouth 3 (three) times daily.     HYDROcodone-acetaminophen 5-325 MG per tablet  Commonly known as:  NORCO/VICODIN  Take 1-2 tablets by mouth every 4 (four) hours as needed for moderate pain.     meclizine 25 MG tablet   Commonly known as:  ANTIVERT  Take 1 tablet (  25 mg total) by mouth 3 (three) times daily as needed for dizziness.     primidone 50 MG tablet  Commonly known as:  MYSOLINE  Take 50 mg by mouth 3 (three) times daily.     tamsulosin 0.4 MG Caps capsule  Commonly known as:  FLOMAX  Take 0.4 mg by mouth daily after breakfast.     valsartan-hydrochlorothiazide 160-25 MG per tablet  Commonly known as:  DIOVAN-HCT  Take 1 tablet by mouth daily.     warfarin 2.5 MG tablet  Commonly known as:  COUMADIN  Take 5-6.25 mg by mouth See admin instructions. Take 2 and 1/2 tablets on Tuesday, Thursday, Saturday then take 2 tablets the other days          Diet and Activity recommendation: See Discharge Instructions above   Consults obtained -  cardiology   Major procedures and Radiology Reports - PLEASE review detailed and final reports for all details, in brief -      Ct Head Wo Contrast  06/18/2015   CLINICAL DATA:  Patient woke up this morning with severe dizziness. Recent fall.  EXAM: CT HEAD WITHOUT CONTRAST  TECHNIQUE: Contiguous axial images were obtained from the base of the skull through the vertex without intravenous contrast.  COMPARISON:  MRI brain 05/19/2015.  CT head 05/19/2015.  FINDINGS: Diffuse cerebral atrophy. No ventricular dilatation. Patchy low-attenuation changes in the deep white matter consistent with small vessel ischemia. No mass effect or midline shift. No abnormal extra-axial fluid collections. Gray-white matter junctions are distinct. Basal cisterns are not effaced. No evidence of acute intracranial hemorrhage. No depressed skull fractures. Visualized paranasal sinuses and mastoid air cells are not opacified.  IMPRESSION: No acute intracranial abnormalities. Mild chronic atrophy and small vessel ischemic changes.   Electronically Signed   By: Lucienne Capers M.D.   On: 06/18/2015 05:58    Micro Results    Recent Results (from the past 240 hour(s))  Culture,  Urine     Status: None   Collection Time: 06/18/15  3:30 AM  Result Value Ref Range Status   Specimen Description URINE, CLEAN CATCH  Final   Special Requests NONE  Final   Culture   Final    <10,000 COLONIES/mL INSIGNIFICANT GROWTH Performed at [facility]    Report Status 06/19/2015 FINAL  Final       Today   Subjective:   Douglas Walker today has no headache,no chest abdominal pain,no new weakness tingling or numbness, feels much better today,  vertigo significantly subsided     Blood pressure 135/63, pulse 61, temperature 98.1 F (36.7 C), temperature source Oral, resp. rate 18, height 5\' 9"  (1.753 m), weight 111.3 kg (245 lb 6 oz), SpO2 97 %.   Intake/Output Summary (Last 24 hours) at 06/21/15 1456 Last data filed at 06/21/15 1244  Gross per 24 hour  Intake   1800 ml  Output   1775 ml  Net     25 ml    Exam Awake Alert, Oriented x 3, No new F.N deficits, Normal affect [place].AT,PERRAL Supple Neck,No JVD, No cervical lymphadenopathy appriciated.  Symmetrical Chest wall movement, Good air movement bilaterally, CTAB No Gallops,Rubs or new Murmurs, No Parasternal Heave +ve B.Sounds, Abd Soft, Non tender, No organomegaly appriciated, No rebound -guarding or rigidity. No Cyanosis, Clubbing or edema, No new Rash or bruise, patient was able to walk with a walker , without any problem with gait or instability .  Data Review   CBC w  Diff:  Lab Results  Component Value Date   WBC 6.6 06/21/2015   WBC 5.3 05/04/2013   HGB 12.2* 06/21/2015   HGB 13.1 05/04/2013   HCT 37.3* 06/21/2015   HCT 39.9 05/04/2013   PLT 114* 06/21/2015   PLT 116* 05/04/2013   LYMPHOPCT 29 05/19/2015   LYMPHOPCT 25.5 05/04/2013   MONOPCT 8 05/19/2015   MONOPCT 12.1 05/04/2013   EOSPCT 2 05/19/2015   EOSPCT 3.3 05/04/2013   BASOPCT 0 05/19/2015   BASOPCT 0.9 05/04/2013    CMP:  Lab Results  Component Value Date   NA 139 06/21/2015   K 4.1 06/21/2015   CL 101 06/21/2015    CO2 29 06/21/2015   BUN 23* 06/21/2015   CREATININE 1.39* 06/21/2015   PROT 6.0* 05/19/2015   ALBUMIN 3.3* 05/19/2015   BILITOT 0.6 05/19/2015   ALKPHOS 73 05/19/2015   AST 18 05/19/2015   ALT 16* 05/19/2015  .   Total Time in preparing paper work, data evaluation and todays exam - 35 minutes  Douglas Walker M.D on 06/21/2015 at 2:56 PM  [facility]   Office  [phone]

## 2015-06-23 DIAGNOSIS — S2242XD Multiple fractures of ribs, left side, subsequent encounter for fracture with routine healing: Secondary | ICD-10-CM | POA: Diagnosis not present

## 2015-06-23 DIAGNOSIS — I2510 Atherosclerotic heart disease of native coronary artery without angina pectoris: Secondary | ICD-10-CM | POA: Diagnosis not present

## 2015-06-23 DIAGNOSIS — I480 Paroxysmal atrial fibrillation: Secondary | ICD-10-CM | POA: Diagnosis not present

## 2015-06-23 DIAGNOSIS — S4992XD Unspecified injury of left shoulder and upper arm, subsequent encounter: Secondary | ICD-10-CM | POA: Diagnosis not present

## 2015-06-23 DIAGNOSIS — I10 Essential (primary) hypertension: Secondary | ICD-10-CM | POA: Diagnosis not present

## 2015-06-23 DIAGNOSIS — I441 Atrioventricular block, second degree: Secondary | ICD-10-CM | POA: Diagnosis not present

## 2015-06-26 DIAGNOSIS — I10 Essential (primary) hypertension: Secondary | ICD-10-CM | POA: Diagnosis not present

## 2015-06-26 DIAGNOSIS — I480 Paroxysmal atrial fibrillation: Secondary | ICD-10-CM | POA: Diagnosis not present

## 2015-06-26 DIAGNOSIS — I441 Atrioventricular block, second degree: Secondary | ICD-10-CM | POA: Diagnosis not present

## 2015-06-26 DIAGNOSIS — I2510 Atherosclerotic heart disease of native coronary artery without angina pectoris: Secondary | ICD-10-CM | POA: Diagnosis not present

## 2015-06-26 DIAGNOSIS — S2242XD Multiple fractures of ribs, left side, subsequent encounter for fracture with routine healing: Secondary | ICD-10-CM | POA: Diagnosis not present

## 2015-06-26 DIAGNOSIS — S4992XD Unspecified injury of left shoulder and upper arm, subsequent encounter: Secondary | ICD-10-CM | POA: Diagnosis not present

## 2015-06-27 ENCOUNTER — Other Ambulatory Visit: Payer: Self-pay | Admitting: Interventional Cardiology

## 2015-06-27 DIAGNOSIS — I480 Paroxysmal atrial fibrillation: Secondary | ICD-10-CM | POA: Diagnosis not present

## 2015-06-27 DIAGNOSIS — I10 Essential (primary) hypertension: Secondary | ICD-10-CM | POA: Diagnosis not present

## 2015-06-27 DIAGNOSIS — I441 Atrioventricular block, second degree: Secondary | ICD-10-CM | POA: Diagnosis not present

## 2015-06-27 DIAGNOSIS — S4992XD Unspecified injury of left shoulder and upper arm, subsequent encounter: Secondary | ICD-10-CM | POA: Diagnosis not present

## 2015-06-27 DIAGNOSIS — S2242XD Multiple fractures of ribs, left side, subsequent encounter for fracture with routine healing: Secondary | ICD-10-CM | POA: Diagnosis not present

## 2015-06-27 DIAGNOSIS — I2510 Atherosclerotic heart disease of native coronary artery without angina pectoris: Secondary | ICD-10-CM | POA: Diagnosis not present

## 2015-06-28 DIAGNOSIS — R55 Syncope and collapse: Secondary | ICD-10-CM | POA: Diagnosis not present

## 2015-06-28 DIAGNOSIS — R42 Dizziness and giddiness: Secondary | ICD-10-CM | POA: Diagnosis not present

## 2015-06-29 DIAGNOSIS — I10 Essential (primary) hypertension: Secondary | ICD-10-CM | POA: Diagnosis not present

## 2015-06-29 DIAGNOSIS — I2510 Atherosclerotic heart disease of native coronary artery without angina pectoris: Secondary | ICD-10-CM | POA: Diagnosis not present

## 2015-06-29 DIAGNOSIS — I441 Atrioventricular block, second degree: Secondary | ICD-10-CM | POA: Diagnosis not present

## 2015-06-29 DIAGNOSIS — S2242XD Multiple fractures of ribs, left side, subsequent encounter for fracture with routine healing: Secondary | ICD-10-CM | POA: Diagnosis not present

## 2015-06-29 DIAGNOSIS — I480 Paroxysmal atrial fibrillation: Secondary | ICD-10-CM | POA: Diagnosis not present

## 2015-06-29 DIAGNOSIS — S4992XD Unspecified injury of left shoulder and upper arm, subsequent encounter: Secondary | ICD-10-CM | POA: Diagnosis not present

## 2015-07-04 ENCOUNTER — Ambulatory Visit (INDEPENDENT_AMBULATORY_CARE_PROVIDER_SITE_OTHER): Payer: Medicare Other | Admitting: *Deleted

## 2015-07-04 DIAGNOSIS — Z5181 Encounter for therapeutic drug level monitoring: Secondary | ICD-10-CM

## 2015-07-04 DIAGNOSIS — I480 Paroxysmal atrial fibrillation: Secondary | ICD-10-CM | POA: Diagnosis not present

## 2015-07-04 DIAGNOSIS — I4891 Unspecified atrial fibrillation: Secondary | ICD-10-CM

## 2015-07-04 LAB — POCT INR: INR: 2.1

## 2015-07-05 DIAGNOSIS — I441 Atrioventricular block, second degree: Secondary | ICD-10-CM | POA: Diagnosis not present

## 2015-07-05 DIAGNOSIS — S4992XD Unspecified injury of left shoulder and upper arm, subsequent encounter: Secondary | ICD-10-CM | POA: Diagnosis not present

## 2015-07-05 DIAGNOSIS — I480 Paroxysmal atrial fibrillation: Secondary | ICD-10-CM | POA: Diagnosis not present

## 2015-07-05 DIAGNOSIS — I2510 Atherosclerotic heart disease of native coronary artery without angina pectoris: Secondary | ICD-10-CM | POA: Diagnosis not present

## 2015-07-05 DIAGNOSIS — S2242XD Multiple fractures of ribs, left side, subsequent encounter for fracture with routine healing: Secondary | ICD-10-CM | POA: Diagnosis not present

## 2015-07-05 DIAGNOSIS — I10 Essential (primary) hypertension: Secondary | ICD-10-CM | POA: Diagnosis not present

## 2015-07-07 DIAGNOSIS — I441 Atrioventricular block, second degree: Secondary | ICD-10-CM | POA: Diagnosis not present

## 2015-07-07 DIAGNOSIS — S4992XD Unspecified injury of left shoulder and upper arm, subsequent encounter: Secondary | ICD-10-CM | POA: Diagnosis not present

## 2015-07-07 DIAGNOSIS — I10 Essential (primary) hypertension: Secondary | ICD-10-CM | POA: Diagnosis not present

## 2015-07-07 DIAGNOSIS — I2510 Atherosclerotic heart disease of native coronary artery without angina pectoris: Secondary | ICD-10-CM | POA: Diagnosis not present

## 2015-07-07 DIAGNOSIS — S2242XD Multiple fractures of ribs, left side, subsequent encounter for fracture with routine healing: Secondary | ICD-10-CM | POA: Diagnosis not present

## 2015-07-07 DIAGNOSIS — I480 Paroxysmal atrial fibrillation: Secondary | ICD-10-CM | POA: Diagnosis not present

## 2015-07-11 DIAGNOSIS — S4992XD Unspecified injury of left shoulder and upper arm, subsequent encounter: Secondary | ICD-10-CM | POA: Diagnosis not present

## 2015-07-11 DIAGNOSIS — I2510 Atherosclerotic heart disease of native coronary artery without angina pectoris: Secondary | ICD-10-CM | POA: Diagnosis not present

## 2015-07-11 DIAGNOSIS — I10 Essential (primary) hypertension: Secondary | ICD-10-CM | POA: Diagnosis not present

## 2015-07-11 DIAGNOSIS — I480 Paroxysmal atrial fibrillation: Secondary | ICD-10-CM | POA: Diagnosis not present

## 2015-07-11 DIAGNOSIS — I441 Atrioventricular block, second degree: Secondary | ICD-10-CM | POA: Diagnosis not present

## 2015-07-11 DIAGNOSIS — S2242XD Multiple fractures of ribs, left side, subsequent encounter for fracture with routine healing: Secondary | ICD-10-CM | POA: Diagnosis not present

## 2015-07-13 DIAGNOSIS — S2242XD Multiple fractures of ribs, left side, subsequent encounter for fracture with routine healing: Secondary | ICD-10-CM | POA: Diagnosis not present

## 2015-07-13 DIAGNOSIS — I480 Paroxysmal atrial fibrillation: Secondary | ICD-10-CM | POA: Diagnosis not present

## 2015-07-13 DIAGNOSIS — I441 Atrioventricular block, second degree: Secondary | ICD-10-CM | POA: Diagnosis not present

## 2015-07-13 DIAGNOSIS — S4992XD Unspecified injury of left shoulder and upper arm, subsequent encounter: Secondary | ICD-10-CM | POA: Diagnosis not present

## 2015-07-13 DIAGNOSIS — I2510 Atherosclerotic heart disease of native coronary artery without angina pectoris: Secondary | ICD-10-CM | POA: Diagnosis not present

## 2015-07-13 DIAGNOSIS — I10 Essential (primary) hypertension: Secondary | ICD-10-CM | POA: Diagnosis not present

## 2015-07-17 DIAGNOSIS — N390 Urinary tract infection, site not specified: Secondary | ICD-10-CM | POA: Diagnosis not present

## 2015-07-18 DIAGNOSIS — I441 Atrioventricular block, second degree: Secondary | ICD-10-CM | POA: Diagnosis not present

## 2015-07-18 DIAGNOSIS — S4992XD Unspecified injury of left shoulder and upper arm, subsequent encounter: Secondary | ICD-10-CM | POA: Diagnosis not present

## 2015-07-18 DIAGNOSIS — I480 Paroxysmal atrial fibrillation: Secondary | ICD-10-CM | POA: Diagnosis not present

## 2015-07-18 DIAGNOSIS — S2242XD Multiple fractures of ribs, left side, subsequent encounter for fracture with routine healing: Secondary | ICD-10-CM | POA: Diagnosis not present

## 2015-07-18 DIAGNOSIS — I2510 Atherosclerotic heart disease of native coronary artery without angina pectoris: Secondary | ICD-10-CM | POA: Diagnosis not present

## 2015-07-18 DIAGNOSIS — I10 Essential (primary) hypertension: Secondary | ICD-10-CM | POA: Diagnosis not present

## 2015-07-19 ENCOUNTER — Other Ambulatory Visit: Payer: Self-pay

## 2015-07-19 ENCOUNTER — Ambulatory Visit (INDEPENDENT_AMBULATORY_CARE_PROVIDER_SITE_OTHER): Payer: Medicare Other | Admitting: Internal Medicine

## 2015-07-19 ENCOUNTER — Encounter: Payer: Self-pay | Admitting: Internal Medicine

## 2015-07-19 VITALS — BP 124/68 | HR 69 | Ht 70.0 in | Wt 248.8 lb

## 2015-07-19 DIAGNOSIS — I441 Atrioventricular block, second degree: Secondary | ICD-10-CM | POA: Diagnosis not present

## 2015-07-19 DIAGNOSIS — I480 Paroxysmal atrial fibrillation: Secondary | ICD-10-CM

## 2015-07-19 DIAGNOSIS — S4992XD Unspecified injury of left shoulder and upper arm, subsequent encounter: Secondary | ICD-10-CM | POA: Diagnosis not present

## 2015-07-19 DIAGNOSIS — S2242XD Multiple fractures of ribs, left side, subsequent encounter for fracture with routine healing: Secondary | ICD-10-CM | POA: Diagnosis not present

## 2015-07-19 DIAGNOSIS — I10 Essential (primary) hypertension: Secondary | ICD-10-CM | POA: Diagnosis not present

## 2015-07-19 DIAGNOSIS — I5032 Chronic diastolic (congestive) heart failure: Secondary | ICD-10-CM

## 2015-07-19 DIAGNOSIS — I2510 Atherosclerotic heart disease of native coronary artery without angina pectoris: Secondary | ICD-10-CM | POA: Diagnosis not present

## 2015-07-19 NOTE — Assessment & Plan Note (Signed)
His blood pressure is well controlled. No change in meds.  

## 2015-07-19 NOTE — Assessment & Plan Note (Signed)
The real question today is whether a PPM might help prevent his spells or help his energy level. He gets sob with exertion. I asked the patient to walk in the halls today with pulse oximetry. His HR increased from 60 to 110 while walking around the office. His pulse ox remained in the low 90's.

## 2015-07-19 NOTE — Assessment & Plan Note (Signed)
He is maintaining NSR. Hopefully he will not have any more falls. If so, warfarin should be stopped.

## 2015-07-19 NOTE — Progress Notes (Signed)
HPI Douglas Walker is referred today for evaluation of bradycardia. He is a pleasant 81 yo man with HTN, PAF, and sinus bradycardia. He has severe dizziness and was diagnosed with vertigo and has undergone PT/OT. He is better. He has had several syncopal episodes.  Allergies  Allergen Reactions  . Sulfa Antibiotics Other (See Comments)    Weakness, dizziness     Current Outpatient Prescriptions  Medication Sig Dispense Refill  . acetaminophen (TYLENOL) 500 MG tablet Take 1,000 mg by mouth every 6 (six) hours as needed for mild pain.    . beta carotene w/minerals (OCUVITE) tablet Take 2 tablets by mouth daily.     . ciprofloxacin (CIPRO) 500 MG tablet Take 1 tablet by mouth 2 (two) times daily.    . DULoxetine (CYMBALTA) 20 MG capsule Take 20 mg by mouth 2 (two) times daily.    . furosemide (LASIX) 20 MG tablet Take 1 tablet by mouth daily as needed. Swelling  3  . gabapentin (NEURONTIN) 100 MG capsule Take 100 mg by mouth 3 (three) times daily.    . primidone (MYSOLINE) 50 MG tablet Take 50 mg by mouth 3 (three) times daily.     . Tamsulosin HCl (FLOMAX) 0.4 MG CAPS Take 0.4 mg by mouth daily after breakfast.     . valsartan-hydrochlorothiazide (DIOVAN-HCT) 160-25 MG per tablet Take 1 tablet by mouth daily.    Marland Kitchen warfarin (COUMADIN) 2.5 MG tablet Take 1 tablet (2.5 mg total) by mouth as directed. 190 tablet 1   No current facility-administered medications for this visit.     Past Medical History  Diagnosis Date  . Hypertension   . Hyperlipemia   . Coronary artery disease   . TIA (transient ischemic attack)   . Hernia   . GERD (gastroesophageal reflux disease)   . Macular degeneration   . Paroxysmal atrial fibrillation 10/28/2013  . Tachycardia-bradycardia syndrome 09/13/2014    ROS:   All systems reviewed and negative except as noted in the HPI.   Past Surgical History  Procedure Laterality Date  . Hernia repair    . Appendectomy    . Hernia repair        Family History  Problem Relation Age of Onset  . Heart attack Neg Hx   . Stroke Neg Hx      History   Social History  . Marital Status: Married    Spouse Name: N/A  . Number of Children: N/A  . Years of Education: N/A   Occupational History  . Not on file.   Social History Main Topics  . Smoking status: Former Smoker    Quit date: 12/30/1962  . Smokeless tobacco: Never Used  . Alcohol Use: No  . Drug Use: No  . Sexual Activity: No   Other Topics Concern  . Not on file   Social History Narrative     BP 124/68 mmHg  Pulse 69  Ht 5\' 10"  (1.778 m)  Wt 248 lb 12.8 oz (112.855 kg)  BMI 35.70 kg/m2  Physical Exam:  Well appearing 81 yo man, looks younger than his stated age, NAD HEENT: Unremarkable Neck:  6 cm JVD, no thyromegally Back:  No CVA tenderness Lungs:  Clear with no wheezes HEART:  Regular rate rhythm, no murmurs, no rubs, no clicks Abd:  soft, positive bowel sounds, no organomegally, no rebound, no guarding Ext:  2 plus pulses, no edema, no cyanosis, no clubbing Skin:  No rashes no nodules Neuro:  CN II through XII intact, motor grossly intact  EKG - nsr with first degree AV block   Assess/Plan:

## 2015-07-19 NOTE — Patient Instructions (Signed)
Medication Instructions:   Your physician recommends that you continue on your current medications as directed. Please refer to the Current Medication list given to you today.    Labwork:   Testing/Procedures:   Follow-Up:  As needed for cardiac related symptoms     Any Other Special Instructions Will Be Listed Below (If Applicable).

## 2015-08-01 ENCOUNTER — Ambulatory Visit (INDEPENDENT_AMBULATORY_CARE_PROVIDER_SITE_OTHER): Payer: Medicare Other | Admitting: *Deleted

## 2015-08-01 DIAGNOSIS — I4891 Unspecified atrial fibrillation: Secondary | ICD-10-CM | POA: Diagnosis not present

## 2015-08-01 DIAGNOSIS — Z5181 Encounter for therapeutic drug level monitoring: Secondary | ICD-10-CM

## 2015-08-01 DIAGNOSIS — I480 Paroxysmal atrial fibrillation: Secondary | ICD-10-CM

## 2015-08-01 LAB — POCT INR: INR: 2.1

## 2015-08-09 DIAGNOSIS — H31301 Unspecified choroidal hemorrhage, right eye: Secondary | ICD-10-CM | POA: Diagnosis not present

## 2015-08-09 DIAGNOSIS — H3532 Exudative age-related macular degeneration: Secondary | ICD-10-CM | POA: Diagnosis not present

## 2015-08-09 DIAGNOSIS — H1851 Endothelial corneal dystrophy: Secondary | ICD-10-CM | POA: Diagnosis not present

## 2015-08-09 DIAGNOSIS — Z947 Corneal transplant status: Secondary | ICD-10-CM | POA: Diagnosis not present

## 2015-08-10 DIAGNOSIS — H3532 Exudative age-related macular degeneration: Secondary | ICD-10-CM | POA: Diagnosis not present

## 2015-08-10 DIAGNOSIS — H3561 Retinal hemorrhage, right eye: Secondary | ICD-10-CM | POA: Diagnosis not present

## 2015-08-10 DIAGNOSIS — H3531 Nonexudative age-related macular degeneration: Secondary | ICD-10-CM | POA: Diagnosis not present

## 2015-08-10 DIAGNOSIS — H35051 Retinal neovascularization, unspecified, right eye: Secondary | ICD-10-CM | POA: Diagnosis not present

## 2015-08-18 DIAGNOSIS — Z79899 Other long term (current) drug therapy: Secondary | ICD-10-CM | POA: Diagnosis not present

## 2015-08-18 DIAGNOSIS — E039 Hypothyroidism, unspecified: Secondary | ICD-10-CM | POA: Diagnosis not present

## 2015-08-18 DIAGNOSIS — D696 Thrombocytopenia, unspecified: Secondary | ICD-10-CM | POA: Diagnosis not present

## 2015-08-18 DIAGNOSIS — Z23 Encounter for immunization: Secondary | ICD-10-CM | POA: Diagnosis not present

## 2015-08-18 DIAGNOSIS — I480 Paroxysmal atrial fibrillation: Secondary | ICD-10-CM | POA: Diagnosis not present

## 2015-08-18 DIAGNOSIS — N183 Chronic kidney disease, stage 3 (moderate): Secondary | ICD-10-CM | POA: Diagnosis not present

## 2015-08-18 DIAGNOSIS — I10 Essential (primary) hypertension: Secondary | ICD-10-CM | POA: Diagnosis not present

## 2015-08-18 DIAGNOSIS — N390 Urinary tract infection, site not specified: Secondary | ICD-10-CM | POA: Diagnosis not present

## 2015-08-18 DIAGNOSIS — Z1389 Encounter for screening for other disorder: Secondary | ICD-10-CM | POA: Diagnosis not present

## 2015-08-18 DIAGNOSIS — I5030 Unspecified diastolic (congestive) heart failure: Secondary | ICD-10-CM | POA: Diagnosis not present

## 2015-08-18 DIAGNOSIS — Z0001 Encounter for general adult medical examination with abnormal findings: Secondary | ICD-10-CM | POA: Diagnosis not present

## 2015-08-18 DIAGNOSIS — E782 Mixed hyperlipidemia: Secondary | ICD-10-CM | POA: Diagnosis not present

## 2015-08-31 ENCOUNTER — Ambulatory Visit (INDEPENDENT_AMBULATORY_CARE_PROVIDER_SITE_OTHER): Payer: Medicare Other | Admitting: *Deleted

## 2015-08-31 ENCOUNTER — Ambulatory Visit (INDEPENDENT_AMBULATORY_CARE_PROVIDER_SITE_OTHER): Payer: Medicare Other | Admitting: Interventional Cardiology

## 2015-08-31 ENCOUNTER — Encounter: Payer: Self-pay | Admitting: Interventional Cardiology

## 2015-08-31 VITALS — BP 110/52 | HR 67 | Ht 70.0 in | Wt 252.0 lb

## 2015-08-31 DIAGNOSIS — N183 Chronic kidney disease, stage 3 unspecified: Secondary | ICD-10-CM

## 2015-08-31 DIAGNOSIS — I495 Sick sinus syndrome: Secondary | ICD-10-CM

## 2015-08-31 DIAGNOSIS — I5032 Chronic diastolic (congestive) heart failure: Secondary | ICD-10-CM

## 2015-08-31 DIAGNOSIS — I441 Atrioventricular block, second degree: Secondary | ICD-10-CM | POA: Diagnosis not present

## 2015-08-31 DIAGNOSIS — I480 Paroxysmal atrial fibrillation: Secondary | ICD-10-CM

## 2015-08-31 DIAGNOSIS — I4891 Unspecified atrial fibrillation: Secondary | ICD-10-CM | POA: Diagnosis not present

## 2015-08-31 DIAGNOSIS — Z5181 Encounter for therapeutic drug level monitoring: Secondary | ICD-10-CM

## 2015-08-31 LAB — POCT INR: INR: 1.4

## 2015-08-31 NOTE — Progress Notes (Signed)
Cardiology Office Note   Date:  08/31/2015   ID:  Douglas Walker, DOB [birth year], MRN [id number]  PCP:  Henrine Screws, MD  Cardiologist:  Sinclair Grooms, MD   Chief Complaint  Patient presents with  . Atrial Fibrillation      History of Present Illness: Douglas Walker is a 81 y.o. male who presents for tachycardia-bradycardia syndrome per 30 day monitor, history of recurring syncopal episodes, history of paroxysmal atrial fibrillation, chronic anticoagulation therapy, prior history of TIA, coronary artery disease.  Patient had an episode of syncope while peering up proximally 6 weeks ago. Subsequent to that he had another episode of severe dizziness and was diagnosed as vertigo. Otherwise he is done well. No other episodes of dizziness or syncope. He did see Dr. Lovena Le in July for consideration of pacemaker therapy, and based upon the node this was not felt to be indicated.  Past Medical History  Diagnosis Date  . Hypertension   . Hyperlipemia   . Coronary artery disease   . TIA (transient ischemic attack)   . Hernia   . GERD (gastroesophageal reflux disease)   . Macular degeneration   . Paroxysmal atrial fibrillation 10/28/2013  . Tachycardia-bradycardia syndrome 09/13/2014    Past Surgical History  Procedure Laterality Date  . Hernia repair    . Appendectomy    . Hernia repair       Current Outpatient Prescriptions  Medication Sig Dispense Refill  . acetaminophen (TYLENOL) 500 MG tablet Take 1,000 mg by mouth every 6 (six) hours as needed for mild pain.    Marland Kitchen amoxicillin (AMOXIL) 500 MG capsule Take 500 mg by mouth 3 (three) times daily.  0  . beta carotene w/minerals (OCUVITE) tablet Take 2 tablets by mouth daily.     . DULoxetine (CYMBALTA) 20 MG capsule Take 20 mg by mouth 2 (two) times daily.    . furosemide (LASIX) 20 MG tablet Take 1 tablet by mouth daily as needed. Swelling  3  . gabapentin (NEURONTIN) 100 MG capsule Take 200 mg by mouth 3  (three) times daily.    . primidone (MYSOLINE) 250 MG tablet Take 250 mg by mouth daily.  11  . Tamsulosin HCl (FLOMAX) 0.4 MG CAPS Take 0.4 mg by mouth daily after breakfast.     . valsartan-hydrochlorothiazide (DIOVAN-HCT) 160-25 MG per tablet Take 1 tablet by mouth daily.    Marland Kitchen warfarin (COUMADIN) 2.5 MG tablet Take 1 tablet (2.5 mg total) by mouth as directed. 190 tablet 1   No current facility-administered medications for this visit.    Allergies:   Sulfa antibiotics    Social History:  The patient  reports that he quit smoking about 52 years ago. He has never used smokeless tobacco. He reports that he does not drink alcohol or use illicit drugs.   Family History:  The patient's family history includes Blindness in his sister; GI problems in his mother; Hearing loss in his sister; Other in his sister. There is no history of Heart attack or Stroke.    ROS:  Please see the history of present illness.   Otherwise, review of systems are positive for snoring, difficulty with balance and gait, vertigo, easy bruising, back pain, visual disturbance..   All other systems are reviewed and negative.    PHYSICAL EXAM: VS:  BP 110/52 mmHg  Pulse 67  Ht 5\' 10"  (1.778 m)  Wt 114.306 kg (252 lb)  BMI 36.16 kg/m2  SpO2 96% , BMI  Body mass index is 36.16 kg/(m^2). GEN: Well nourished, well developed, in no acute distress. Obese HEENT: normal Neck: no JVD, carotid bruits, or masses Cardiac: RRR; no murmurs, rubs, or gallops,no edema  Respiratory:  clear to auscultation bilaterally, normal work of breathing GI: soft, nontender, nondistended, + BS MS: no deformity or atrophy Skin: warm and dry, no rash Neuro:  Strength and sensation are intact Psych: euthymic mood, full affect   EKG:  EKG is not ordered today.    Recent Labs: 05/19/2015: ALT 16*; TSH 4.839* 06/18/2015: Magnesium 1.9 06/21/2015: BUN 23*; Creatinine, Ser 1.39*; Hemoglobin 12.2*; Platelets 114*; Potassium 4.1; Sodium 139     Lipid Panel No results found for: CHOL, TRIG, HDL, CHOLHDL, VLDL, LDLCALC, LDLDIRECT    Wt Readings from Last 3 Encounters:  08/31/15 114.306 kg (252 lb)  07/19/15 112.855 kg (248 lb 12.8 oz)  06/21/15 111.3 kg (245 lb 6 oz)      Other studies Reviewed: Additional studies/ records that were reviewed today include: . Review of the above records demonstrates: reviewed the recent hospital records. Reviewed Dr. Tanna Furry electrophysiology consult.   ASSESSMENT AND PLAN:  1. Chronic diastolic CHF (congestive heart failure) No evidence of volume overload  2. Paroxysmal atrial fibrillation No symptomatic recurrences of A. fib  3. Mobitz type II atrioventricular block No documented high-grade AV block associated with symptoms  4. Tachycardia-bradycardia syndrome Based upon 30 day monitor done September 2015  5. CKD (chronic kidney disease) stage 3, GFR 30-59 ml/min: Probable stable    Current medicines are reviewed at length with the patient today.  The patient does not have concerns regarding medicines.  The following changes have been made:  Having reviewed Dr. Tanna Furry note, we will simply continue to follow the patient. No active cardiovascular evaluation or additional therapy at this time. He'll remain on anticoagulation therapy. They have been cautioned to notify me if any recurrences of syncope occur.  Labs/ tests ordered today include:  No orders of the defined types were placed in this encounter.     Disposition:   FU with HS in 1 years  Signed, Sinclair Grooms, MD  08/31/2015 1:53 PM    [facility] [place], [place], [place]  [address] Phone: [phone]; Fax: [phone]

## 2015-08-31 NOTE — Patient Instructions (Signed)
Medication Instructions:  Your physician recommends that you continue on your current medications as directed. Please refer to the Current Medication list given to you today.   Labwork: None ordered  Testing/Procedures: None ordered  Follow-Up: Your physician wants you to follow-up in: 1 year with Dr.Smith You will receive a reminder letter in the mail two months in advance. If you don't receive a letter, please call our office to schedule the follow-up appointment.   Any Other Special Instructions Will Be Listed Below (If Applicable). Call the office if you have any reoccurrences of syncope (passing out)

## 2015-09-06 DIAGNOSIS — S81802A Unspecified open wound, left lower leg, initial encounter: Secondary | ICD-10-CM | POA: Diagnosis not present

## 2015-09-06 DIAGNOSIS — L02419 Cutaneous abscess of limb, unspecified: Secondary | ICD-10-CM | POA: Diagnosis not present

## 2015-09-07 DIAGNOSIS — L03116 Cellulitis of left lower limb: Secondary | ICD-10-CM | POA: Diagnosis not present

## 2015-09-07 DIAGNOSIS — S81802A Unspecified open wound, left lower leg, initial encounter: Secondary | ICD-10-CM | POA: Diagnosis not present

## 2015-09-11 DIAGNOSIS — S81802A Unspecified open wound, left lower leg, initial encounter: Secondary | ICD-10-CM | POA: Diagnosis not present

## 2015-09-11 DIAGNOSIS — L03116 Cellulitis of left lower limb: Secondary | ICD-10-CM | POA: Diagnosis not present

## 2015-09-13 ENCOUNTER — Ambulatory Visit (INDEPENDENT_AMBULATORY_CARE_PROVIDER_SITE_OTHER): Payer: Medicare Other | Admitting: *Deleted

## 2015-09-13 DIAGNOSIS — I4891 Unspecified atrial fibrillation: Secondary | ICD-10-CM

## 2015-09-13 DIAGNOSIS — I480 Paroxysmal atrial fibrillation: Secondary | ICD-10-CM | POA: Diagnosis not present

## 2015-09-13 DIAGNOSIS — Z5181 Encounter for therapeutic drug level monitoring: Secondary | ICD-10-CM

## 2015-09-13 DIAGNOSIS — L02419 Cutaneous abscess of limb, unspecified: Secondary | ICD-10-CM | POA: Diagnosis not present

## 2015-09-13 LAB — POCT INR: INR: 3

## 2015-09-14 DIAGNOSIS — H3532 Exudative age-related macular degeneration: Secondary | ICD-10-CM | POA: Diagnosis not present

## 2015-09-14 DIAGNOSIS — L03116 Cellulitis of left lower limb: Secondary | ICD-10-CM | POA: Diagnosis not present

## 2015-09-14 DIAGNOSIS — S81802A Unspecified open wound, left lower leg, initial encounter: Secondary | ICD-10-CM | POA: Diagnosis not present

## 2015-09-15 DIAGNOSIS — L03116 Cellulitis of left lower limb: Secondary | ICD-10-CM | POA: Diagnosis not present

## 2015-09-15 DIAGNOSIS — S81802A Unspecified open wound, left lower leg, initial encounter: Secondary | ICD-10-CM | POA: Diagnosis not present

## 2015-09-18 DIAGNOSIS — S81802A Unspecified open wound, left lower leg, initial encounter: Secondary | ICD-10-CM | POA: Diagnosis not present

## 2015-09-18 DIAGNOSIS — L03116 Cellulitis of left lower limb: Secondary | ICD-10-CM | POA: Diagnosis not present

## 2015-09-20 DIAGNOSIS — L03116 Cellulitis of left lower limb: Secondary | ICD-10-CM | POA: Diagnosis not present

## 2015-09-20 DIAGNOSIS — S81802A Unspecified open wound, left lower leg, initial encounter: Secondary | ICD-10-CM | POA: Diagnosis not present

## 2015-09-22 DIAGNOSIS — L03116 Cellulitis of left lower limb: Secondary | ICD-10-CM | POA: Diagnosis not present

## 2015-09-22 DIAGNOSIS — S81802A Unspecified open wound, left lower leg, initial encounter: Secondary | ICD-10-CM | POA: Diagnosis not present

## 2015-09-25 DIAGNOSIS — L03116 Cellulitis of left lower limb: Secondary | ICD-10-CM | POA: Diagnosis not present

## 2015-09-25 DIAGNOSIS — S81802A Unspecified open wound, left lower leg, initial encounter: Secondary | ICD-10-CM | POA: Diagnosis not present

## 2015-09-27 ENCOUNTER — Ambulatory Visit (INDEPENDENT_AMBULATORY_CARE_PROVIDER_SITE_OTHER): Payer: Medicare Other | Admitting: *Deleted

## 2015-09-27 DIAGNOSIS — I4891 Unspecified atrial fibrillation: Secondary | ICD-10-CM | POA: Diagnosis not present

## 2015-09-27 DIAGNOSIS — S81802A Unspecified open wound, left lower leg, initial encounter: Secondary | ICD-10-CM | POA: Diagnosis not present

## 2015-09-27 DIAGNOSIS — Z5181 Encounter for therapeutic drug level monitoring: Secondary | ICD-10-CM | POA: Diagnosis not present

## 2015-09-27 DIAGNOSIS — L03116 Cellulitis of left lower limb: Secondary | ICD-10-CM | POA: Diagnosis not present

## 2015-09-27 DIAGNOSIS — I480 Paroxysmal atrial fibrillation: Secondary | ICD-10-CM | POA: Diagnosis not present

## 2015-09-27 LAB — POCT INR: INR: 2.5

## 2015-09-29 DIAGNOSIS — L03116 Cellulitis of left lower limb: Secondary | ICD-10-CM | POA: Diagnosis not present

## 2015-09-29 DIAGNOSIS — S81802A Unspecified open wound, left lower leg, initial encounter: Secondary | ICD-10-CM | POA: Diagnosis not present

## 2015-10-02 DIAGNOSIS — C44319 Basal cell carcinoma of skin of other parts of face: Secondary | ICD-10-CM | POA: Diagnosis not present

## 2015-10-02 DIAGNOSIS — L821 Other seborrheic keratosis: Secondary | ICD-10-CM | POA: Diagnosis not present

## 2015-10-02 DIAGNOSIS — D1801 Hemangioma of skin and subcutaneous tissue: Secondary | ICD-10-CM | POA: Diagnosis not present

## 2015-10-02 DIAGNOSIS — C44311 Basal cell carcinoma of skin of nose: Secondary | ICD-10-CM | POA: Diagnosis not present

## 2015-10-02 DIAGNOSIS — Z85828 Personal history of other malignant neoplasm of skin: Secondary | ICD-10-CM | POA: Diagnosis not present

## 2015-10-02 DIAGNOSIS — D485 Neoplasm of uncertain behavior of skin: Secondary | ICD-10-CM | POA: Diagnosis not present

## 2015-10-02 DIAGNOSIS — L570 Actinic keratosis: Secondary | ICD-10-CM | POA: Diagnosis not present

## 2015-10-25 ENCOUNTER — Ambulatory Visit (INDEPENDENT_AMBULATORY_CARE_PROVIDER_SITE_OTHER): Payer: Medicare Other | Admitting: *Deleted

## 2015-10-25 DIAGNOSIS — Z5181 Encounter for therapeutic drug level monitoring: Secondary | ICD-10-CM

## 2015-10-25 DIAGNOSIS — I4891 Unspecified atrial fibrillation: Secondary | ICD-10-CM

## 2015-10-25 DIAGNOSIS — I480 Paroxysmal atrial fibrillation: Secondary | ICD-10-CM | POA: Diagnosis not present

## 2015-10-25 LAB — POCT INR: INR: 1.7

## 2015-10-26 DIAGNOSIS — H353211 Exudative age-related macular degeneration, right eye, with active choroidal neovascularization: Secondary | ICD-10-CM | POA: Diagnosis not present

## 2015-11-15 ENCOUNTER — Telehealth: Payer: Self-pay | Admitting: *Deleted

## 2015-11-15 ENCOUNTER — Ambulatory Visit (INDEPENDENT_AMBULATORY_CARE_PROVIDER_SITE_OTHER): Payer: Medicare Other | Admitting: *Deleted

## 2015-11-15 DIAGNOSIS — I480 Paroxysmal atrial fibrillation: Secondary | ICD-10-CM

## 2015-11-15 DIAGNOSIS — I4891 Unspecified atrial fibrillation: Secondary | ICD-10-CM | POA: Diagnosis not present

## 2015-11-15 DIAGNOSIS — Z5181 Encounter for therapeutic drug level monitoring: Secondary | ICD-10-CM | POA: Diagnosis not present

## 2015-11-15 LAB — POCT INR: INR: 1.4

## 2015-11-15 NOTE — Telephone Encounter (Signed)
Pt's son called stating he had forgotten to tell us on coumadin visit this am that his father is getting injection in his eye for macular degeneration and that the opthomologist states he has bleed behind eye. Asked if the MD knew that he is on coumadin and the son states he does not know so instructed to call Opthomologist today and inform him of this and to call us back with any further information and he states understanding. Also informed son that we have many pts that are on coumadin and do receive injections in their eyes due to macular degeneration  wthout any problems

## 2015-11-21 ENCOUNTER — Ambulatory Visit (INDEPENDENT_AMBULATORY_CARE_PROVIDER_SITE_OTHER): Payer: Medicare Other | Admitting: *Deleted

## 2015-11-21 DIAGNOSIS — I4891 Unspecified atrial fibrillation: Secondary | ICD-10-CM | POA: Diagnosis not present

## 2015-11-21 DIAGNOSIS — I480 Paroxysmal atrial fibrillation: Secondary | ICD-10-CM

## 2015-11-21 DIAGNOSIS — Z5181 Encounter for therapeutic drug level monitoring: Secondary | ICD-10-CM | POA: Diagnosis not present

## 2015-11-21 LAB — POCT INR: INR: 1.7

## 2015-12-01 ENCOUNTER — Ambulatory Visit (INDEPENDENT_AMBULATORY_CARE_PROVIDER_SITE_OTHER): Payer: Medicare Other | Admitting: *Deleted

## 2015-12-01 DIAGNOSIS — Z5181 Encounter for therapeutic drug level monitoring: Secondary | ICD-10-CM

## 2015-12-01 DIAGNOSIS — I480 Paroxysmal atrial fibrillation: Secondary | ICD-10-CM

## 2015-12-01 DIAGNOSIS — I4891 Unspecified atrial fibrillation: Secondary | ICD-10-CM

## 2015-12-01 LAB — POCT INR: INR: 1.9

## 2015-12-15 ENCOUNTER — Ambulatory Visit (INDEPENDENT_AMBULATORY_CARE_PROVIDER_SITE_OTHER): Payer: Medicare Other | Admitting: *Deleted

## 2015-12-15 DIAGNOSIS — Z5181 Encounter for therapeutic drug level monitoring: Secondary | ICD-10-CM | POA: Diagnosis not present

## 2015-12-15 DIAGNOSIS — I4891 Unspecified atrial fibrillation: Secondary | ICD-10-CM | POA: Diagnosis not present

## 2015-12-15 DIAGNOSIS — I480 Paroxysmal atrial fibrillation: Secondary | ICD-10-CM | POA: Diagnosis not present

## 2015-12-15 LAB — POCT INR: INR: 2.3

## 2015-12-18 ENCOUNTER — Other Ambulatory Visit: Payer: Self-pay | Admitting: Interventional Cardiology

## 2015-12-21 DIAGNOSIS — H353211 Exudative age-related macular degeneration, right eye, with active choroidal neovascularization: Secondary | ICD-10-CM | POA: Diagnosis not present

## 2015-12-27 DIAGNOSIS — N390 Urinary tract infection, site not specified: Secondary | ICD-10-CM | POA: Diagnosis not present

## 2015-12-27 DIAGNOSIS — N401 Enlarged prostate with lower urinary tract symptoms: Secondary | ICD-10-CM | POA: Diagnosis not present

## 2015-12-27 DIAGNOSIS — N138 Other obstructive and reflux uropathy: Secondary | ICD-10-CM | POA: Diagnosis not present

## 2016-01-04 ENCOUNTER — Ambulatory Visit (INDEPENDENT_AMBULATORY_CARE_PROVIDER_SITE_OTHER): Payer: Medicare Other | Admitting: *Deleted

## 2016-01-04 DIAGNOSIS — I480 Paroxysmal atrial fibrillation: Secondary | ICD-10-CM | POA: Diagnosis not present

## 2016-01-04 DIAGNOSIS — I4891 Unspecified atrial fibrillation: Secondary | ICD-10-CM | POA: Diagnosis not present

## 2016-01-04 DIAGNOSIS — Z5181 Encounter for therapeutic drug level monitoring: Secondary | ICD-10-CM

## 2016-01-04 LAB — POCT INR: INR: 1.6

## 2016-01-18 ENCOUNTER — Ambulatory Visit (INDEPENDENT_AMBULATORY_CARE_PROVIDER_SITE_OTHER): Payer: Medicare Other | Admitting: *Deleted

## 2016-01-18 ENCOUNTER — Telehealth: Payer: Self-pay

## 2016-01-18 DIAGNOSIS — I4891 Unspecified atrial fibrillation: Secondary | ICD-10-CM

## 2016-01-18 DIAGNOSIS — Z5181 Encounter for therapeutic drug level monitoring: Secondary | ICD-10-CM

## 2016-01-18 DIAGNOSIS — I480 Paroxysmal atrial fibrillation: Secondary | ICD-10-CM | POA: Diagnosis not present

## 2016-01-18 LAB — POCT INR: INR: 2.1

## 2016-01-18 NOTE — Telephone Encounter (Signed)
Digoxin removed from pt med list

## 2016-01-18 NOTE — Telephone Encounter (Signed)
-----   Message from Fernande Boyden, RN sent at 01/18/2016 12:14 PM EST ----- Regarding: digoxin Son states he told dr Tamala Julian his father was taking digoxin but in fact it was his father in law and his dad has never taken digoxin, needs to be removed. Thanks, Lovett Sox, RN

## 2016-02-08 ENCOUNTER — Ambulatory Visit (INDEPENDENT_AMBULATORY_CARE_PROVIDER_SITE_OTHER): Payer: Medicare Other | Admitting: *Deleted

## 2016-02-08 DIAGNOSIS — I4891 Unspecified atrial fibrillation: Secondary | ICD-10-CM | POA: Diagnosis not present

## 2016-02-08 DIAGNOSIS — Z5181 Encounter for therapeutic drug level monitoring: Secondary | ICD-10-CM

## 2016-02-08 DIAGNOSIS — I480 Paroxysmal atrial fibrillation: Secondary | ICD-10-CM | POA: Diagnosis not present

## 2016-02-08 LAB — POCT INR: INR: 2

## 2016-02-15 DIAGNOSIS — H353133 Nonexudative age-related macular degeneration, bilateral, advanced atrophic without subfoveal involvement: Secondary | ICD-10-CM | POA: Diagnosis not present

## 2016-02-15 DIAGNOSIS — H35351 Cystoid macular degeneration, right eye: Secondary | ICD-10-CM | POA: Diagnosis not present

## 2016-02-15 DIAGNOSIS — H353211 Exudative age-related macular degeneration, right eye, with active choroidal neovascularization: Secondary | ICD-10-CM | POA: Diagnosis not present

## 2016-02-21 DIAGNOSIS — R251 Tremor, unspecified: Secondary | ICD-10-CM | POA: Diagnosis not present

## 2016-02-21 DIAGNOSIS — I480 Paroxysmal atrial fibrillation: Secondary | ICD-10-CM | POA: Diagnosis not present

## 2016-02-21 DIAGNOSIS — I10 Essential (primary) hypertension: Secondary | ICD-10-CM | POA: Diagnosis not present

## 2016-02-21 DIAGNOSIS — I5030 Unspecified diastolic (congestive) heart failure: Secondary | ICD-10-CM | POA: Diagnosis not present

## 2016-02-21 DIAGNOSIS — N183 Chronic kidney disease, stage 3 (moderate): Secondary | ICD-10-CM | POA: Diagnosis not present

## 2016-02-28 ENCOUNTER — Ambulatory Visit (INDEPENDENT_AMBULATORY_CARE_PROVIDER_SITE_OTHER): Payer: Medicare Other | Admitting: Neurology

## 2016-02-28 ENCOUNTER — Encounter: Payer: Self-pay | Admitting: Neurology

## 2016-02-28 VITALS — BP 135/64 | HR 67 | Ht 69.0 in | Wt 255.0 lb

## 2016-02-28 DIAGNOSIS — G250 Essential tremor: Secondary | ICD-10-CM

## 2016-02-28 HISTORY — DX: Essential tremor: G25.0

## 2016-02-28 MED ORDER — TOPIRAMATE 25 MG PO TABS: ORAL_TABLET | ORAL | Status: DC

## 2016-02-28 NOTE — Patient Instructions (Addendum)
Topamax (topiramate) is a seizure medication that has an FDA approval for seizures and for migraine headache. Potential side effects of this medication include weight loss, cognitive slowing, tingling in the fingers and toes, and carbonated drinks will taste bad. If any significant side effects are noted on this drug, please contact our office.  Essential Tremor A tremor is trembling or shaking that you cannot control. Most tremors affect the hands or arms. Tremors can also affect the head, vocal cords, face, and other parts of the body.  Essential tremor is a tremor without a known cause.  CAUSES Essential tremor has no known cause.  RISK FACTORS You may be at greater risk of essential tremor if:   You have a family member with essential tremor.   You are age 80 or older.   You take certain medicines. SIGNS AND SYMPTOMS The main sign of a tremor is uncontrolled and unintentional rhythmic shaking of a body part.  You may have difficulty eating with a spoon or fork.   You may have difficulty writing.   You may nod your head up and down or side to side.   You may have a quivering voice.  Your tremors:  May get worse over time.   May come and go.   May be more noticeable on one side of your body.   May get worse due to stress, fatigue, caffeine, and extreme heat or cold.  DIAGNOSIS Your health care provider can diagnose essential tremor based on your symptoms, medical history, and a physical examination. There is no single test to diagnose an essential tremor. However, your health care provider may perform a variety of tests to rule out other conditions. Tests may include:   Blood and urine tests.   Imaging studies of your brain, such as:   CT scan.   MRI.   A test that measures involuntary muscle movement (electromyogram). TREATMENT Your tremors may go away without treatment. Mild tremors may not need treatment if they do not affect your day-to-day  life. Severe tremors may need to be treated using one or a combination of the following options:   Medicines. This may include medicine that is injected.  Lifestyle changes.   Physical therapy.  HOME CARE INSTRUCTIONS  Take medicines only as directed by your health care provider.   Limit alcohol intake to no more than 1 drink per day for nonpregnant women and 2 drinks per day for men. One drink equals 12 oz of beer, 5 oz of wine, or 1 oz of hard liquor.  Do not use any tobacco products, including cigarettes, chewing tobacco, or electronic cigarettes. If you need help quitting, ask your health care provider.  Take medicines only as directed by your health care provider.   Avoid extreme heat or cold.   Limit the amount of caffeine you consumeas directed by your health care provider.   Try to get eight hours of sleep each night.  Find ways to manage your stress, such as meditation or yoga.  Keep all follow-up visits as directed by your health care provider. This is important. This includes any physical therapy visits. SEEK MEDICAL CARE IF:  You experience any changes in the location or intensity of your tremors.   You start having a tremor after starting a new medicine.   You have tremor with other symptoms such as:   Numbness.   Tingling.   Pain.   Weakness.   Your tremor gets worse.   Your tremor  interferes with your daily life.    This information is not intended to replace advice given to you by your health care provider. Make sure you discuss any questions you have with your health care provider.   Document Released: 01/06/2015 Document Reviewed: 01/06/2015 Elsevier Interactive Patient Education [employer].

## 2016-02-28 NOTE — Progress Notes (Signed)
Reason for visit: Tremor  Referring physician: Dr. Anner Crete is a 82 y.o. male  History of present illness:  Douglas Walker is an 82 year old right-handed white male with a history of a tremor that has been involving his arms primarily, right greater than left, for at least 15-20 years. Over the years, the tremor has become a bit more prominent. Within the last several years he has been developing a more prominent jaw tremor. He denies any vocal tremor. He has difficulty with handwriting, and difficulty holding objects or feeding himself. He has been placed on Mysoline, recently increased from 250 mg to 500 mg daily. He is having some drowsiness on the medication, but it is not suppressing his tremor to a significant degree. The patient is using his left hand primarily to feed himself at this point. The patient has a history of a peripheral neuropathy, he does have some gait instability, with occasional falls. He denies any issues controlling the bowels or the bladder. He denies any family history of tremor. He did not know his father, however. He is sent to this office for an evaluation.  Past Medical History  Diagnosis Date  . Hypertension   . Hyperlipemia   . Coronary artery disease   . TIA (transient ischemic attack)   . Hernia   . GERD (gastroesophageal reflux disease)   . Macular degeneration   . Paroxysmal atrial fibrillation ([place]) 10/28/2013  . Tachycardia-bradycardia syndrome ([place]) 09/13/2014  . Essential tremor 02/28/2016    Past Surgical History  Procedure Laterality Date  . Hernia repair    . Appendectomy    . Hernia repair      Family History  Problem Relation Age of Onset  . Heart attack Neg Hx   . Stroke Neg Hx   . GI problems Mother   . Other Sister     PAIN ISSUES  . Hearing loss Sister   . Blindness Sister     Social history:  reports that he quit smoking about 53 years ago. He has never used smokeless tobacco. He reports that he does not  drink alcohol or use illicit drugs.  Medications:  Prior to Admission medications   Medication Sig Start Date End Date Taking? Authorizing Provider  acetaminophen (TYLENOL) 500 MG tablet Take 1,000 mg by mouth every 6 (six) hours as needed for mild pain.   Yes Historical Provider, MD  beta carotene w/minerals (OCUVITE) tablet Take 2 tablets by mouth daily.    Yes Historical Provider, MD  DULoxetine (CYMBALTA) 20 MG capsule Take 20 mg by mouth 2 (two) times daily.   Yes Historical Provider, MD  furosemide (LASIX) 20 MG tablet Take 1 tablet by mouth daily as needed. Swelling 05/30/15  Yes Historical Provider, MD  gabapentin (NEURONTIN) 100 MG capsule Take 200 mg by mouth 3 (three) times daily. 08/29/15  Yes Historical Provider, MD  meclizine (ANTIVERT) 25 MG tablet Take 25 mg by mouth 2 (two) times daily.   Yes Historical Provider, MD  primidone (MYSOLINE) 250 MG tablet Take 250 mg by mouth daily. 08/18/15  Yes Historical Provider, MD  Tamsulosin HCl (FLOMAX) 0.4 MG CAPS Take 0.4 mg by mouth daily after breakfast.    Yes Historical Provider, MD  valsartan-hydrochlorothiazide (DIOVAN-HCT) 160-25 MG per tablet Take 1 tablet by mouth daily. 05/30/15  Yes Liliane Shi, PA-C  warfarin (COUMADIN) 2.5 MG tablet TAKE 1 TABLET (2.5 MG TOTAL) BY MOUTH AS DIRECTED. 12/18/15  Yes Belva Crome,  MD      Allergies  Allergen Reactions  . Sulfa Antibiotics Other (See Comments)    Weakness, dizziness    ROS:  Out of a complete 14 system review of symptoms, the patient complains only of the following symptoms, and all other reviewed systems are negative.  Swelling in the legs Hearing loss Loss of vision Snoring Dizziness, passing out, tremor Depression  Blood pressure 135/64, pulse 67, height 5\' 9"  (1.753 m), weight 255 lb (115.667 kg).  Physical Exam  General: The patient is alert and cooperative at the time of the examination. The patient is moderately obese.  Eyes: Pupils are equal, round, and  reactive to light. Discs are flat bilaterally.  Neck: The neck is supple, no carotid bruits are noted.  Respiratory: The respiratory examination is clear.  Cardiovascular: The cardiovascular examination reveals a regular rate and rhythm, no obvious murmurs or rubs are noted.  Skin: Extremities are with 1+ edema at the ankles bilaterally.  Neurologic Exam  Mental status: The patient is alert and oriented x 3 at the time of the examination. The patient has apparent normal recent and remote memory, with an apparently normal attention span and concentration ability.  Cranial nerves: Facial symmetry is present. There is good sensation of the face to pinprick and soft touch bilaterally. The strength of the facial muscles and the muscles to head turning and shoulder shrug are normal bilaterally. Speech is well enunciated, no aphasia or dysarthria is noted. Extraocular movements are full. Visual fields are full. The tongue is midline, and the patient has symmetric elevation of the soft palate. No obvious hearing deficits are noted. Mild masking of the face is seen. A prominent jaw tremor is noted.  Motor: The motor testing reveals 5 over 5 strength of all 4 extremities. Good symmetric motor tone is noted throughout.  Sensory: Sensory testing is intact to pinprick, soft touch, vibration sensation, and position sense on the upper extremities. With the lower extremities, there is a stocking pattern pinprick sensory deficit up to the knees. The patient has some moderate impairment of vibration sensation in both feet, left greater than right. Position sense is minimally impaired bilaterally in the feet. No evidence of extinction is noted.  Coordination: Cerebellar testing reveals good heel-to-shin bilaterally. The patient does have an intention tremor with the right greater than left upper extremity with finger-nose-finger.  Gait and station: Gait is normal. The patient has bilateral arm swing that is  symmetric. Tandem gait is slightly unsteady. Romberg is negative. No drift is seen.  Reflexes: Deep tendon reflexes are symmetric, but are depressed bilaterally. Toes are downgoing bilaterally.   MRI brain 05/19/15:  IMPRESSION: 1. No acute intracranial abnormality. 2. Moderate chronic small vessel ischemic disease.  * MRI scan images were reviewed online. I agree with the written report.    Assessment/Plan:  1. Essential tremor  2. Peripheral neuropathy  The patient has features that are most consistent with an essential tremor. The patient does have an intention type tremor with the upper extremities that can be quite severe with the right arm. The patient gained benefit with the Mysoline earlier in his treatment course, but as the tremor has gotten worse the Mysoline has become less effective. It is unlikely that any medication is going to completely eliminate the tremor. I may add a low-dose of Topamax at this time, if the patient is tolerating the medication and it is effective the dose can be increased. He will follow-up in about 4 months.  He is not a candidate for a deep brain stimulator.  Jill Alexanders MD 02/28/2016 7:02 PM  [facility] [address] [place], [place] [address]  Phone [phone] Fax [phone]

## 2016-03-07 ENCOUNTER — Ambulatory Visit (INDEPENDENT_AMBULATORY_CARE_PROVIDER_SITE_OTHER): Payer: Medicare Other | Admitting: *Deleted

## 2016-03-07 DIAGNOSIS — I480 Paroxysmal atrial fibrillation: Secondary | ICD-10-CM

## 2016-03-07 DIAGNOSIS — Z5181 Encounter for therapeutic drug level monitoring: Secondary | ICD-10-CM

## 2016-03-07 DIAGNOSIS — I4891 Unspecified atrial fibrillation: Secondary | ICD-10-CM

## 2016-03-07 LAB — POCT INR: INR: 2.3

## 2016-03-11 ENCOUNTER — Other Ambulatory Visit: Payer: Self-pay | Admitting: Interventional Cardiology

## 2016-03-13 DIAGNOSIS — R609 Edema, unspecified: Secondary | ICD-10-CM | POA: Diagnosis not present

## 2016-03-13 DIAGNOSIS — R300 Dysuria: Secondary | ICD-10-CM | POA: Diagnosis not present

## 2016-04-04 ENCOUNTER — Encounter: Payer: Self-pay | Admitting: Podiatry

## 2016-04-04 ENCOUNTER — Ambulatory Visit (INDEPENDENT_AMBULATORY_CARE_PROVIDER_SITE_OTHER): Payer: Medicare Other | Admitting: Podiatry

## 2016-04-04 VITALS — BP 130/69 | HR 60 | Resp 12

## 2016-04-04 DIAGNOSIS — M79676 Pain in unspecified toe(s): Secondary | ICD-10-CM

## 2016-04-04 DIAGNOSIS — E1142 Type 2 diabetes mellitus with diabetic polyneuropathy: Secondary | ICD-10-CM | POA: Diagnosis not present

## 2016-04-04 DIAGNOSIS — B351 Tinea unguium: Secondary | ICD-10-CM

## 2016-04-04 NOTE — Progress Notes (Signed)
   Subjective:    Patient ID: Douglas Walker, male    DOB: [birth year], 82 y.o.   MRN: [id number]  HPI he presents today with a chief complaint of painful toenails bilateral. He would like to have them trimmed. He states that he is unable to do so due to his tremors. He also states that he is on Coumadin and is afraid he may cut itself and bleeding to death.    Review of Systems  HENT: Positive for hearing loss.   Skin: Positive for color change.       Objective:   Physical Exam: Vital signs are stable alert and oriented 3 pulses are palpable bilateral considerable edema nonpitting in nature bilateral lower extremity. Neurologic sensorium is slightly diminished. Vibratory sensation but intact epicritic sensation is present. Muscle strength is normal orthopedic evaluation was resolved with distal to the angle full range of motion without crepitation. Cutaneous evaluation and straight supple hydrated cutis no erythema edema saline as drainage or odor other than after mentioned. Toenails are thick yellow dystrophic onychomycotic and painful on palpation with sharp incurvated nail margins.        Assessment & Plan:  Assessment: Pain and limb secondary to onychomycosis and sharp incurvated nail margins.  Plan: Debridement of toenails 1 through 5 bilateral. Follow up with [place] in 3 months

## 2016-04-11 DIAGNOSIS — H353211 Exudative age-related macular degeneration, right eye, with active choroidal neovascularization: Secondary | ICD-10-CM | POA: Diagnosis not present

## 2016-04-11 DIAGNOSIS — H353133 Nonexudative age-related macular degeneration, bilateral, advanced atrophic without subfoveal involvement: Secondary | ICD-10-CM | POA: Diagnosis not present

## 2016-04-12 ENCOUNTER — Ambulatory Visit (INDEPENDENT_AMBULATORY_CARE_PROVIDER_SITE_OTHER): Payer: Medicare Other

## 2016-04-12 ENCOUNTER — Encounter (HOSPITAL_COMMUNITY): Payer: Self-pay

## 2016-04-12 ENCOUNTER — Ambulatory Visit (HOSPITAL_COMMUNITY)
Admission: EM | Admit: 2016-04-12 | Discharge: 2016-04-12 | Disposition: A | Discharge status: Home / Self Care | Payer: Medicare Other | Attending: Emergency Medicine | Admitting: Emergency Medicine

## 2016-04-12 DIAGNOSIS — J189 Pneumonia, unspecified organism: Secondary | ICD-10-CM

## 2016-04-12 DIAGNOSIS — S2232XA Fracture of one rib, left side, initial encounter for closed fracture: Secondary | ICD-10-CM

## 2016-04-12 MED ORDER — AZITHROMYCIN 250 MG PO TABS: ORAL_TABLET | ORAL | Status: DC

## 2016-04-12 MED ORDER — AMOXICILLIN 500 MG PO CAPS: 1000.0000 mg | ORAL_CAPSULE | Freq: Two times a day (BID) | ORAL | Status: DC

## 2016-04-12 NOTE — ED Notes (Signed)
Patient states he fell on Tuesday 04/09/2016 in the bathroom and hurt his ribs on the left side and is in severe pain. Patient took tylenol today at 12:00pm  No acute distress Son at bedside

## 2016-04-12 NOTE — ED Provider Notes (Addendum)
CSN: [id number]     Arrival date & time 04/12/16  1329 History   First MD Initiated Contact with Patient 04/12/16 1549     Chief Complaint  Patient presents with  . Rib Injury   (Consider location/radiation/quality/duration/timing/severity/associated sxs/prior Treatment) HPI  He is an 82 year old man here with his son for evaluation of left rib pain. He states on Tuesday he fell in the bathroom, hitting his left side against the bathtub. He denies any dizziness or syncope. He states he over balanced which caused him to fall. He denies any head injury. He states he was doing okay until last night and this morning and he developed worsening pain in the left lateral ribs. He is on Coumadin for paroxysmal A. fib. His son states he has had several broken ribs in the past, also on the left side. He denies any chest pain or shortness of breath. Pain is worse with deep breaths.  Past Medical History  Diagnosis Date  . Hypertension   . Hyperlipemia   . Coronary artery disease   . TIA (transient ischemic attack)   . Hernia   . GERD (gastroesophageal reflux disease)   . Macular degeneration   . Paroxysmal atrial fibrillation ([place]) 10/28/2013  . Tachycardia-bradycardia syndrome ([place]) 09/13/2014  . Essential tremor 02/28/2016   Past Surgical History  Procedure Laterality Date  . Hernia repair    . Appendectomy    . Hernia repair     Family History  Problem Relation Age of Onset  . Heart attack Neg Hx   . Stroke Neg Hx   . GI problems Mother   . Other Sister     PAIN ISSUES  . Hearing loss Sister   . Blindness Sister    Social History  Substance Use Topics  . Smoking status: Former Smoker    Quit date: 12/30/1962  . Smokeless tobacco: Never Used  . Alcohol Use: No    Review of Systems As in history of present illness Allergies  Sulfa antibiotics  Home Medications   Prior to Admission medications   Medication Sig Start Date End Date Taking? Authorizing Provider  acetaminophen  (TYLENOL) 500 MG tablet Take 1,000 mg by mouth every 6 (six) hours as needed for mild pain.   Yes Historical Provider, MD  beta carotene w/minerals (OCUVITE) tablet Take 2 tablets by mouth daily.    Yes Historical Provider, MD  DULoxetine (CYMBALTA) 20 MG capsule Take 20 mg by mouth 2 (two) times daily.   Yes Historical Provider, MD  furosemide (LASIX) 20 MG tablet Take 1 tablet by mouth daily as needed. Swelling 05/30/15  Yes Historical Provider, MD  gabapentin (NEURONTIN) 100 MG capsule Take 200 mg by mouth 3 (three) times daily. 08/29/15  Yes Historical Provider, MD  primidone (MYSOLINE) 250 MG tablet Take 250 mg by mouth daily. 08/18/15  Yes Historical Provider, MD  Tamsulosin HCl (FLOMAX) 0.4 MG CAPS Take 0.4 mg by mouth daily after breakfast.    Yes Historical Provider, MD  topiramate (TOPAMAX) 25 MG tablet One tablet daily for 2 weeks then take one tablet twice a day 02/28/16  Yes Kathrynn Ducking, MD  valsartan-hydrochlorothiazide (DIOVAN-HCT) 160-25 MG per tablet Take 1 tablet by mouth daily. 05/30/15  Yes Liliane Shi, PA-C  warfarin (COUMADIN) 2.5 MG tablet Take as directed by Coumadin clinic. 03/11/16  Yes Belva Crome, MD  amoxicillin (AMOXIL) 500 MG capsule Take 2 capsules (1,000 mg total) by mouth 2 (two) times daily. 04/12/16  Melony Overly, MD  azithromycin (ZITHROMAX Z-PAK) 250 MG tablet Take 2 pills today, then 1 pill daily until gone. 04/12/16   Melony Overly, MD  meclizine (ANTIVERT) 25 MG tablet Take 25 mg by mouth 2 (two) times daily.    Historical Provider, MD   Meds Ordered and Administered this Visit  Medications - No data to display  BP 132/62 mmHg  Pulse 58  Temp(Src) 98.3 F (36.8 C) (Oral)  Resp 20  SpO2 97% No data found.   Physical Exam  Constitutional: He is oriented to person, place, and time. He appears well-developed and well-nourished. No distress.  Cardiovascular: Normal rate, regular rhythm and normal heart sounds.   No murmur heard. Pulmonary/Chest:  Effort normal and breath sounds normal. No respiratory distress. He has no wheezes. He has no rales.  He has a small amount of bruising in the left side. He is tender over the inferior ribs in the anterior to midaxillary line. This is superior to the bruising.  Neurological: He is alert and oriented to person, place, and time.    ED Course  Procedures (including critical care time)  Labs Review Labs Reviewed - No data to display  Imaging Review Dg Ribs Unilateral W/chest Left  04/12/2016  CLINICAL DATA:  Pain following fall 3 days prior EXAM: LEFT RIBS AND CHEST - 3+ VIEW COMPARISON:  Chest radiograph May 19, 2015 and chest CT May 19, 2015 FINDINGS: Frontal chest as well as oblique and cone-down lower rib images were obtained. There is a small area of infiltrate in the lateral left base. Lungs elsewhere clear. Heart size and pulmonary vascular normal. Aorta is tortuous but stable. There is a fracture of the anterior left eleventh rib in near anatomic alignment. IMPRESSION: Fracture anterior left eleventh rib in near anatomic alignment. No other fracture. No pneumothorax. Smaller of infiltrate lateral left base. These results will be called to the ordering clinician or representative by the Radiologist Assistant, and communication documented in the PACS or zVision Dashboard. Electronically Signed   By: Lowella [place] III M.D.   On: 04/12/2016 16:48     MDM   1. Left rib fracture, closed, initial encounter   2. CAP (community acquired pneumonia)    I had an extensive discussion with the patient and his son about his diagnosis. The patient currently lives on his own. He is adamant about returning home today. I have discussed that given his age and concern for pneumonia on x-ray, he does warrant observation in the hospital. He does not want to do this. I have also encouraged him to stay with his son, but he prefers to go home. I have sent in prescriptions for azithromycin and amoxicillin to  cover potential pneumonia. Given that he lives alone, will not prescribe narcotic medications for pain. Patient states he will use Tylenol as needed. I did review his INR and it has been well controlled the last several months. The son will check in frequently with his father over the next several days. He will continue to try and get his father to stay with him. If things are not going well at home or he develops worsening pain or trouble breathing, they will go directly to the emergency room. I have recommended follow-up with his primary care doctor on Monday or Tuesday.    Melony Overly, MD 04/12/16 1724  Called and spoke with patient on 04/13/16.  He states he is doing well.  He does report some soreness, worse  in the mornings.  He has been taking deep breaths as often as he can.  He denies any fevers or shortness of breath.    Melony Overly, MD 04/13/16 1320

## 2016-04-12 NOTE — Discharge Instructions (Signed)
You have a broken rib. The x-ray is also concerning for a developing pneumonia by the rib. Take azithromycin and amoxicillin as prescribed. You can take Tylenol as needed for pain. Make sure you are taking deep breaths at least every hour. I would prefer if you stayed with your son and his wife this weekend. If things are not going well, you are having worsening pain, or trouble breathing, please call your son and go to the emergency room right away. Follow-up with your regular doctor on Monday or Tuesday.

## 2016-04-16 DIAGNOSIS — S2232XA Fracture of one rib, left side, initial encounter for closed fracture: Secondary | ICD-10-CM | POA: Diagnosis not present

## 2016-04-16 DIAGNOSIS — R251 Tremor, unspecified: Secondary | ICD-10-CM | POA: Diagnosis not present

## 2016-04-18 ENCOUNTER — Ambulatory Visit (INDEPENDENT_AMBULATORY_CARE_PROVIDER_SITE_OTHER): Payer: Medicare Other | Admitting: *Deleted

## 2016-04-18 DIAGNOSIS — I4891 Unspecified atrial fibrillation: Secondary | ICD-10-CM | POA: Diagnosis not present

## 2016-04-18 DIAGNOSIS — I480 Paroxysmal atrial fibrillation: Secondary | ICD-10-CM | POA: Diagnosis not present

## 2016-04-18 DIAGNOSIS — Z5181 Encounter for therapeutic drug level monitoring: Secondary | ICD-10-CM

## 2016-04-18 LAB — POCT INR: INR: 2.7

## 2016-05-28 DIAGNOSIS — N390 Urinary tract infection, site not specified: Secondary | ICD-10-CM | POA: Diagnosis not present

## 2016-05-30 ENCOUNTER — Ambulatory Visit (INDEPENDENT_AMBULATORY_CARE_PROVIDER_SITE_OTHER): Payer: Medicare Other

## 2016-05-30 DIAGNOSIS — I4891 Unspecified atrial fibrillation: Secondary | ICD-10-CM | POA: Diagnosis not present

## 2016-05-30 DIAGNOSIS — Z5181 Encounter for therapeutic drug level monitoring: Secondary | ICD-10-CM | POA: Diagnosis not present

## 2016-05-30 DIAGNOSIS — I480 Paroxysmal atrial fibrillation: Secondary | ICD-10-CM

## 2016-05-30 LAB — POCT INR: INR: 2.3

## 2016-06-03 ENCOUNTER — Ambulatory Visit (INDEPENDENT_AMBULATORY_CARE_PROVIDER_SITE_OTHER): Payer: Medicare Other | Admitting: *Deleted

## 2016-06-03 DIAGNOSIS — I4891 Unspecified atrial fibrillation: Secondary | ICD-10-CM | POA: Diagnosis not present

## 2016-06-03 DIAGNOSIS — Z5181 Encounter for therapeutic drug level monitoring: Secondary | ICD-10-CM | POA: Diagnosis not present

## 2016-06-03 DIAGNOSIS — I480 Paroxysmal atrial fibrillation: Secondary | ICD-10-CM | POA: Diagnosis not present

## 2016-06-03 LAB — POCT INR: INR: 2.2

## 2016-06-06 ENCOUNTER — Other Ambulatory Visit: Payer: Self-pay | Admitting: Interventional Cardiology

## 2016-06-11 DIAGNOSIS — N390 Urinary tract infection, site not specified: Secondary | ICD-10-CM | POA: Diagnosis not present

## 2016-06-17 DIAGNOSIS — H353211 Exudative age-related macular degeneration, right eye, with active choroidal neovascularization: Secondary | ICD-10-CM | POA: Diagnosis not present

## 2016-06-18 ENCOUNTER — Other Ambulatory Visit: Payer: Self-pay | Admitting: Neurology

## 2016-07-01 ENCOUNTER — Encounter: Payer: Self-pay | Admitting: Neurology

## 2016-07-01 ENCOUNTER — Ambulatory Visit (INDEPENDENT_AMBULATORY_CARE_PROVIDER_SITE_OTHER): Payer: Medicare Other | Admitting: Neurology

## 2016-07-01 VITALS — Ht 69.5 in | Wt 247.5 lb

## 2016-07-01 DIAGNOSIS — G250 Essential tremor: Secondary | ICD-10-CM | POA: Diagnosis not present

## 2016-07-01 DIAGNOSIS — G609 Hereditary and idiopathic neuropathy, unspecified: Secondary | ICD-10-CM | POA: Insufficient documentation

## 2016-07-01 MED ORDER — TOPIRAMATE 25 MG PO TABS: ORAL_TABLET | ORAL | Status: DC

## 2016-07-01 NOTE — Progress Notes (Signed)
Reason for visit: Tremor  Douglas Walker is an 82 y.o. male  History of present illness:  Douglas Walker is an 82 year old right-handed white male with a history of a benign essential tremor that mainly affects the jaw, but also affects both upper extremities. The patient is on Mysoline and Topamax was recently added with some improvement. The jaw tremor has not been affected much, but he has better control over the arms. The patient has some difficulty with handwriting and feeding himself, he indicates that when he is holding an object in his hand he will start tremoring and he may drop the object. The patient has a peripheral neuropathy with some numbness up to the knees bilaterally, he denies any falls, he uses a cane for ambulation. Within the last 2 weeks he has noted some numbness in the hands, particularly in the middle and ring fingers. This does not keep him awake at night. He returns to this office for an evaluation.  Past Medical History  Diagnosis Date  . Hypertension   . Hyperlipemia   . Coronary artery disease   . TIA (transient ischemic attack)   . Hernia   . GERD (gastroesophageal reflux disease)   . Macular degeneration   . Paroxysmal atrial fibrillation ([facility]) 10/28/2013  . Tachycardia-bradycardia syndrome ([place]) 09/13/2014  . Essential tremor 02/28/2016    Past Surgical History  Procedure Laterality Date  . Hernia repair    . Appendectomy    . Hernia repair      Family History  Problem Relation Age of Onset  . Heart attack Neg Hx   . Stroke Neg Hx   . GI problems Mother   . Other Sister     PAIN ISSUES  . Hearing loss Sister   . Blindness Sister     Social history:  reports that he quit smoking about 53 years ago. He has never used smokeless tobacco. He reports that he does not drink alcohol or use illicit drugs.    Allergies  Allergen Reactions  . Sulfa Antibiotics Other (See Comments)    Weakness, dizziness    Medications:  Prior to Admission  medications   Medication Sig Start Date End Date Taking? Authorizing Provider  acetaminophen (TYLENOL) 500 MG tablet Take 1,000 mg by mouth every 6 (six) hours as needed for mild pain.    Historical Provider, MD  beta carotene w/minerals (OCUVITE) tablet Take 2 tablets by mouth daily.     Historical Provider, MD  DULoxetine (CYMBALTA) 20 MG capsule Take 20 mg by mouth 2 (two) times daily.    Historical Provider, MD  furosemide (LASIX) 20 MG tablet Take 1 tablet by mouth daily as needed. Swelling 05/30/15   Historical Provider, MD  gabapentin (NEURONTIN) 100 MG capsule Take 200 mg by mouth 3 (three) times daily. 08/29/15   Historical Provider, MD  meclizine (ANTIVERT) 25 MG tablet Take 25 mg by mouth 2 (two) times daily.    Historical Provider, MD  primidone (MYSOLINE) 250 MG tablet Take 250 mg by mouth daily. 08/18/15   Historical Provider, MD  Tamsulosin HCl (FLOMAX) 0.4 MG CAPS Take 0.4 mg by mouth daily after breakfast.     Historical Provider, MD  topiramate (TOPAMAX) 25 MG tablet Two tablets in the morning and one in the evening 07/01/16   Kathrynn Ducking, MD  traMADol (ULTRAM) 50 MG tablet Take by mouth every 6 (six) hours as needed.    Historical Provider, MD  valsartan (DIOVAN) 160 MG  tablet Take 160 mg by mouth daily. 05/28/16   Historical Provider, MD  valsartan-hydrochlorothiazide (DIOVAN-HCT) 160-25 MG per tablet Take 1 tablet by mouth daily. 05/30/15   Liliane Shi, PA-C  warfarin (COUMADIN) 2.5 MG tablet TAKE AS DIRECTED BY COUMADIN CLINIC. 06/07/16   Belva Crome, MD    ROS:  Out of a complete 14 system review of symptoms, the patient complains only of the following symptoms, and all other reviewed systems are negative.  Loss of vision, macular degeneration Numbness Tremor  Height 5' 9.5" (1.765 m), weight 247 lb 8 oz (112.265 kg).  Physical Exam  General: The patient is alert and cooperative at the time of the examination. The patient is moderately obese.  Skin: 2+ edema is  noted below the knees bilaterally.   Neurologic Exam  Mental status: The patient is alert and oriented x 3 at the time of the examination. The patient has apparent normal recent and remote memory, with an apparently normal attention span and concentration ability.   Cranial nerves: Facial symmetry is present. Speech is normal, no aphasia or dysarthria is noted. Extraocular movements are full. Visual fields are full. A prominent jaw tremor is noted.  Motor: The patient has good strength in all 4 extremities.  Sensory examination: Soft touch sensation is symmetric on the face, arms, and legs. Tinel's sign is positive at the wrists bilaterally.  Coordination: The patient has good finger-nose-finger and heel-to-shin bilaterally. The patient has mild tremor with finger-nose-finger bilaterally.  Gait and station: The patient has a slightly wide-based gait, the patient walks with a cane. Tandem gait was not attempted. Romberg is negative.  Reflexes: Deep tendon reflexes are symmetric, but are depressed bilaterally.   Assessment/Plan:  1. Peripheral neuropathy  2. Essential tremor  3. Gait disturbance  The patient will be increased on the Topamax taking 50 mg in the morning and 25 mg in the evening, he will continue the Mysoline. He will follow-up in 5 months, sooner if needed. He may have bilateral carpal tunnel syndrome symptoms of numbness in the hands. I have given him a prescription for wrist splints, if this does not improve the symptoms after 6 weeks, he is to contact our office. We may consider EMG and nerve conduction study evaluation.  Jill Alexanders MD 07/01/2016 6:32 PM  [facility] [address] [place], [place] [address]  Phone [phone] Fax [phone]

## 2016-07-04 ENCOUNTER — Ambulatory Visit (INDEPENDENT_AMBULATORY_CARE_PROVIDER_SITE_OTHER): Payer: Medicare Other | Admitting: Podiatry

## 2016-07-04 DIAGNOSIS — M79676 Pain in unspecified toe(s): Secondary | ICD-10-CM | POA: Diagnosis not present

## 2016-07-04 DIAGNOSIS — B351 Tinea unguium: Secondary | ICD-10-CM | POA: Diagnosis not present

## 2016-07-04 NOTE — Progress Notes (Signed)
He presents today with a chief complaint of painful elongated toenails.  Objective: Pulses are palpable bilateral. Nails are sharply incurvated thick yellow dystrophic and clinically mycotic painful palpation as well as debridement.  Assessment: Limb secondary to onychomycosis.  Plan: Debridement of toenails 1 through 5 bilateral.

## 2016-07-14 ENCOUNTER — Other Ambulatory Visit: Payer: Self-pay | Admitting: Neurology

## 2016-07-16 ENCOUNTER — Ambulatory Visit (INDEPENDENT_AMBULATORY_CARE_PROVIDER_SITE_OTHER): Payer: Medicare Other | Admitting: *Deleted

## 2016-07-16 DIAGNOSIS — Z5181 Encounter for therapeutic drug level monitoring: Secondary | ICD-10-CM

## 2016-07-16 DIAGNOSIS — I480 Paroxysmal atrial fibrillation: Secondary | ICD-10-CM | POA: Diagnosis not present

## 2016-07-16 LAB — POCT INR: INR: 2.2

## 2016-07-24 DIAGNOSIS — N3020 Other chronic cystitis without hematuria: Secondary | ICD-10-CM | POA: Diagnosis not present

## 2016-08-26 DIAGNOSIS — Z1389 Encounter for screening for other disorder: Secondary | ICD-10-CM | POA: Diagnosis not present

## 2016-08-26 DIAGNOSIS — Z6834 Body mass index (BMI) 34.0-34.9, adult: Secondary | ICD-10-CM | POA: Diagnosis not present

## 2016-08-26 DIAGNOSIS — Z0000 Encounter for general adult medical examination without abnormal findings: Secondary | ICD-10-CM | POA: Diagnosis not present

## 2016-08-26 DIAGNOSIS — Z23 Encounter for immunization: Secondary | ICD-10-CM | POA: Diagnosis not present

## 2016-08-26 DIAGNOSIS — I4891 Unspecified atrial fibrillation: Secondary | ICD-10-CM | POA: Diagnosis not present

## 2016-08-26 DIAGNOSIS — G629 Polyneuropathy, unspecified: Secondary | ICD-10-CM | POA: Diagnosis not present

## 2016-08-26 DIAGNOSIS — I480 Paroxysmal atrial fibrillation: Secondary | ICD-10-CM | POA: Diagnosis not present

## 2016-08-26 DIAGNOSIS — R609 Edema, unspecified: Secondary | ICD-10-CM | POA: Diagnosis not present

## 2016-08-26 DIAGNOSIS — N390 Urinary tract infection, site not specified: Secondary | ICD-10-CM | POA: Diagnosis not present

## 2016-08-26 DIAGNOSIS — D696 Thrombocytopenia, unspecified: Secondary | ICD-10-CM | POA: Diagnosis not present

## 2016-08-26 DIAGNOSIS — N183 Chronic kidney disease, stage 3 (moderate): Secondary | ICD-10-CM | POA: Diagnosis not present

## 2016-08-26 DIAGNOSIS — E782 Mixed hyperlipidemia: Secondary | ICD-10-CM | POA: Diagnosis not present

## 2016-08-26 DIAGNOSIS — R251 Tremor, unspecified: Secondary | ICD-10-CM | POA: Diagnosis not present

## 2016-08-26 DIAGNOSIS — I672 Cerebral atherosclerosis: Secondary | ICD-10-CM | POA: Diagnosis not present

## 2016-08-26 DIAGNOSIS — R946 Abnormal results of thyroid function studies: Secondary | ICD-10-CM | POA: Diagnosis not present

## 2016-08-27 ENCOUNTER — Ambulatory Visit (INDEPENDENT_AMBULATORY_CARE_PROVIDER_SITE_OTHER): Payer: Medicare Other

## 2016-08-27 DIAGNOSIS — Z5181 Encounter for therapeutic drug level monitoring: Secondary | ICD-10-CM | POA: Diagnosis not present

## 2016-08-27 DIAGNOSIS — I480 Paroxysmal atrial fibrillation: Secondary | ICD-10-CM

## 2016-08-27 LAB — POCT INR: INR: 2.3

## 2016-08-30 DIAGNOSIS — N3020 Other chronic cystitis without hematuria: Secondary | ICD-10-CM | POA: Diagnosis not present

## 2016-08-30 DIAGNOSIS — N390 Urinary tract infection, site not specified: Secondary | ICD-10-CM | POA: Diagnosis not present

## 2016-08-30 DIAGNOSIS — N401 Enlarged prostate with lower urinary tract symptoms: Secondary | ICD-10-CM | POA: Diagnosis not present

## 2016-08-30 DIAGNOSIS — R3915 Urgency of urination: Secondary | ICD-10-CM | POA: Diagnosis not present

## 2016-08-30 DIAGNOSIS — R351 Nocturia: Secondary | ICD-10-CM | POA: Diagnosis not present

## 2016-09-23 DIAGNOSIS — H353211 Exudative age-related macular degeneration, right eye, with active choroidal neovascularization: Secondary | ICD-10-CM | POA: Diagnosis not present

## 2016-09-26 DIAGNOSIS — I5030 Unspecified diastolic (congestive) heart failure: Secondary | ICD-10-CM | POA: Diagnosis not present

## 2016-09-26 DIAGNOSIS — N390 Urinary tract infection, site not specified: Secondary | ICD-10-CM | POA: Diagnosis not present

## 2016-09-26 DIAGNOSIS — F322 Major depressive disorder, single episode, severe without psychotic features: Secondary | ICD-10-CM | POA: Diagnosis not present

## 2016-09-26 DIAGNOSIS — G629 Polyneuropathy, unspecified: Secondary | ICD-10-CM | POA: Diagnosis not present

## 2016-09-26 DIAGNOSIS — M25512 Pain in left shoulder: Secondary | ICD-10-CM | POA: Diagnosis not present

## 2016-09-26 DIAGNOSIS — R609 Edema, unspecified: Secondary | ICD-10-CM | POA: Diagnosis not present

## 2016-09-26 DIAGNOSIS — M7582 Other shoulder lesions, left shoulder: Secondary | ICD-10-CM | POA: Diagnosis not present

## 2016-09-26 DIAGNOSIS — I10 Essential (primary) hypertension: Secondary | ICD-10-CM | POA: Diagnosis not present

## 2016-09-27 ENCOUNTER — Encounter: Payer: Self-pay | Admitting: Interventional Cardiology

## 2016-09-27 ENCOUNTER — Encounter (INDEPENDENT_AMBULATORY_CARE_PROVIDER_SITE_OTHER): Payer: Self-pay

## 2016-09-27 ENCOUNTER — Ambulatory Visit (INDEPENDENT_AMBULATORY_CARE_PROVIDER_SITE_OTHER): Payer: Medicare Other | Admitting: *Deleted

## 2016-09-27 ENCOUNTER — Ambulatory Visit (INDEPENDENT_AMBULATORY_CARE_PROVIDER_SITE_OTHER): Payer: Medicare Other | Admitting: Interventional Cardiology

## 2016-09-27 VITALS — BP 128/66 | HR 57 | Ht 69.5 in | Wt 232.0 lb

## 2016-09-27 DIAGNOSIS — I480 Paroxysmal atrial fibrillation: Secondary | ICD-10-CM | POA: Diagnosis not present

## 2016-09-27 DIAGNOSIS — Z7901 Long term (current) use of anticoagulants: Secondary | ICD-10-CM | POA: Diagnosis not present

## 2016-09-27 DIAGNOSIS — I10 Essential (primary) hypertension: Secondary | ICD-10-CM

## 2016-09-27 DIAGNOSIS — I5032 Chronic diastolic (congestive) heart failure: Secondary | ICD-10-CM | POA: Diagnosis not present

## 2016-09-27 DIAGNOSIS — I441 Atrioventricular block, second degree: Secondary | ICD-10-CM

## 2016-09-27 DIAGNOSIS — Z5181 Encounter for therapeutic drug level monitoring: Secondary | ICD-10-CM

## 2016-09-27 LAB — POCT INR: INR: 1.5

## 2016-09-27 NOTE — Progress Notes (Signed)
Cardiology Office Note    Date:  09/27/2016   ID:  Douglas Walker, DOB [birth year], MRN [id number]  PCP:  Henrine Screws, MD  Cardiologist: Sinclair Grooms, MD   Chief Complaint  Patient presents with  . Atrial Fibrillation    History of Present Illness:  Douglas Walker is a 82 y.o. male for tachycardia-bradycardia syndrome per 30 day monitor, history of recurring syncopal episodes, history of paroxysmal atrial fibrillation, chronic anticoagulation therapy, prior history of TIA, coronary artery disease.  Fell twice in April with rib fractures. One episode was a true syncopal episode during micturition. He denies orthopnea, PND, and recurrent syncope. He denies chest pain. He was seen by EP, Dr. Beckie Salts. It was felt that pacemaker was not indicated at that time.  No bleeding on Coumadin.  Past Medical History:  Diagnosis Date  . Coronary artery disease   . Essential tremor 02/28/2016  . GERD (gastroesophageal reflux disease)   . Hernia   . Hyperlipemia   . Hypertension   . Macular degeneration   . Paroxysmal atrial fibrillation ([place]) 10/28/2013  . Tachycardia-bradycardia syndrome ([place]) 09/13/2014  . TIA (transient ischemic attack)     Past Surgical History:  Procedure Laterality Date  . APPENDECTOMY    . HERNIA REPAIR    . HERNIA REPAIR      Current Medications: Outpatient Medications Prior to Visit  Medication Sig Dispense Refill  . acetaminophen (TYLENOL) 500 MG tablet Take 1,000 mg by mouth every 6 (six) hours as needed for mild pain.    . beta carotene w/minerals (OCUVITE) tablet Take 2 tablets by mouth daily.     . DULoxetine (CYMBALTA) 20 MG capsule Take 20 mg by mouth 2 (two) times daily.    . furosemide (LASIX) 20 MG tablet Take 1 tablet by mouth daily as needed. Swelling  3  . primidone (MYSOLINE) 250 MG tablet Take 250 mg by mouth daily.  11  . Tamsulosin HCl (FLOMAX) 0.4 MG CAPS Take 0.4 mg by mouth daily after breakfast.     . topiramate  (TOPAMAX) 25 MG tablet Two tablets in the morning and one in the evening 270 tablet 1  . valsartan (DIOVAN) 160 MG tablet Take 160 mg by mouth daily.  3  . warfarin (COUMADIN) 2.5 MG tablet TAKE AS DIRECTED BY COUMADIN CLINIC. 230 tablet 1  . gabapentin (NEURONTIN) 100 MG capsule Take 200 mg by mouth 3 (three) times daily.    . meclizine (ANTIVERT) 25 MG tablet Take 25 mg by mouth 2 (two) times daily.    . traMADol (ULTRAM) 50 MG tablet Take by mouth every 6 (six) hours as needed.    . valsartan-hydrochlorothiazide (DIOVAN-HCT) 160-25 MG per tablet Take 1 tablet by mouth daily. (Patient not taking: Reported on 09/27/2016)     No facility-administered medications prior to visit.      Allergies:   Sulfa antibiotics   Social History   Social History  . Marital status: Widowed    Spouse name: N/A  . Number of children: 1  . Years of education: 10   Occupational History  .      retired   Social History Main Topics  . Smoking status: Former Smoker    Quit date: 12/30/1962  . Smokeless tobacco: Never Used  . Alcohol use No  . Drug use: No  . Sexual activity: No   Other Topics Concern  . None   Social History Narrative   Lives alone  Right-handed   Caffeine use- caffeine free coffee, occas soda     Family History:  The patient's family history includes Blindness in his sister; GI problems in his mother; Hearing loss in his sister; Other in his sister.   ROS:   Please see the history of present illness.    20 pound weight loss with resolution of significant edema after discontinuation of gabapentin. Other complaints include recurrent abdominal discomfort, depression, dyspnea on exertion, back discomfort, muscle pain, easy bruising, snoring, difficulty urinating, and difficulty with balance. Occasional headaches.  All other systems reviewed and are negative.   PHYSICAL EXAM:   VS:  BP 128/66   Pulse (!) 57   Ht 5' 9.5" (1.765 m)   Wt 232 lb (105.2 kg)   BMI 33.77 kg/m      GEN: Well nourished, well developed, in no acute distress  HEENT: normal  Neck: no JVD, carotid bruits, or masses Cardiac: IIRR; no murmurs, rubs, or gallops,no edema  Respiratory:  clear to auscultation bilaterally, normal work of breathing GI: soft, nontender, nondistended, + BS MS: no deformity or atrophy  Skin: warm and dry, no rash Neuro:  Alert and Oriented x 3, Strength and sensation are intact Psych: euthymic mood, full affect  Wt Readings from Last 3 Encounters:  09/27/16 232 lb (105.2 kg)  07/01/16 247 lb 8 oz (112.3 kg)  02/28/16 255 lb (115.7 kg)      Studies/Labs Reviewed:   EKG:  EKG  Atrial fibrillation, left axis deviation, nonspecific ST abnormality, left anterior hemiblock. When compared to prior tracings  Recent Labs: No results found for requested labs within last 8760 hours.   Lipid Panel No results found for: CHOL, TRIG, HDL, CHOLHDL, VLDL, LDLCALC, LDLDIRECT  Additional studies/ records that were reviewed today include:  No new data    ASSESSMENT:    1. Chronic diastolic CHF (congestive heart failure) ([place])   2. Essential hypertension   3. Paroxysmal atrial fibrillation ([facility])   4. Chronic anticoagulation   5. Mobitz type II atrioventricular block      PLAN:  In order of problems listed above:  1. Lower extremity edema resolved after discontinuation of gabapentin. There is no clinical evidence of volume overload. 2. Very well controlled. 3. Atrial fibrillation with slow ventricular response implies AV conduction system disease. He was seen by EP this year after an episode of syncope but not felt to be a candidate for pacemaker at this time. We did discuss the possibility that pacemaker therapy may become possible. He will be willing if indicated. 4. No complications. He is on warfarin. Followed in Coumadin clinic. 5. Note above #3.    Medication Adjustments/Labs and Tests Ordered: Current medicines are reviewed at length with the patient  today.  Concerns regarding medicines are outlined above.  Medication changes, Labs and Tests ordered today are listed in the Patient Instructions below. Patient Instructions  Medication Instructions:  Your physician recommends that you continue on your current medications as directed. Please refer to the Current Medication list given to you today.   Labwork: None ordered  Testing/Procedures: None ordered  Follow-Up: Your physician wants you to follow-up in: 1 year with Dr.Smith You will receive a reminder letter in the mail two months in advance. If you don't receive a letter, please call our office to schedule the follow-up appointment.   Any Other Special Instructions Will Be Listed Below (If Applicable).     If you need a refill on your cardiac medications before  your next appointment, please call your pharmacy.      Signed, Sinclair Grooms, MD  09/27/2016 11:57 AM    [facility] [place], [place], [place]  [address] Phone: [phone]; Fax: [phone]

## 2016-09-27 NOTE — Patient Instructions (Signed)
Medication Instructions:  Your physician recommends that you continue on your current medications as directed. Please refer to the Current Medication list given to you today.   Labwork: None ordered  Testing/Procedures: None ordered  Follow-Up: Your physician wants you to follow-up in: 1 year with Dr.Smith You will receive a reminder letter in the mail two months in advance. If you don't receive a letter, please call our office to schedule the follow-up appointment.   Any Other Special Instructions Will Be Listed Below (If Applicable).     If you need a refill on your cardiac medications before your next appointment, please call your pharmacy.   

## 2016-10-03 ENCOUNTER — Encounter: Payer: Self-pay | Admitting: Podiatry

## 2016-10-03 ENCOUNTER — Ambulatory Visit (INDEPENDENT_AMBULATORY_CARE_PROVIDER_SITE_OTHER): Payer: Medicare Other | Admitting: Podiatry

## 2016-10-03 DIAGNOSIS — E1142 Type 2 diabetes mellitus with diabetic polyneuropathy: Secondary | ICD-10-CM | POA: Diagnosis not present

## 2016-10-03 DIAGNOSIS — B351 Tinea unguium: Secondary | ICD-10-CM | POA: Diagnosis not present

## 2016-10-03 DIAGNOSIS — M79676 Pain in unspecified toe(s): Secondary | ICD-10-CM | POA: Diagnosis not present

## 2016-10-03 NOTE — Progress Notes (Signed)
He presents today with chief complaint of painful elongated toenails.  Objective: Vital signs are stable he is alert and oriented 3. Pulses are palpable. Toenails are thick yellow dystrophic with mycotic no open lesions or wounds.  Assessment: Pain limb secondary to onychomycosis.  Plan: Debridement of toenails 1 through 5 bilateral.

## 2016-10-11 ENCOUNTER — Ambulatory Visit (INDEPENDENT_AMBULATORY_CARE_PROVIDER_SITE_OTHER): Payer: Medicare Other | Admitting: *Deleted

## 2016-10-11 DIAGNOSIS — I480 Paroxysmal atrial fibrillation: Secondary | ICD-10-CM

## 2016-10-11 DIAGNOSIS — Z5181 Encounter for therapeutic drug level monitoring: Secondary | ICD-10-CM | POA: Diagnosis not present

## 2016-10-11 LAB — POCT INR: INR: 1.1

## 2016-10-17 ENCOUNTER — Ambulatory Visit (INDEPENDENT_AMBULATORY_CARE_PROVIDER_SITE_OTHER): Payer: Medicare Other

## 2016-10-17 DIAGNOSIS — Z5181 Encounter for therapeutic drug level monitoring: Secondary | ICD-10-CM | POA: Diagnosis not present

## 2016-10-17 DIAGNOSIS — I480 Paroxysmal atrial fibrillation: Secondary | ICD-10-CM

## 2016-10-17 LAB — POCT INR: INR: 1.2

## 2016-10-24 ENCOUNTER — Ambulatory Visit (INDEPENDENT_AMBULATORY_CARE_PROVIDER_SITE_OTHER): Payer: Medicare Other

## 2016-10-24 DIAGNOSIS — Z5181 Encounter for therapeutic drug level monitoring: Secondary | ICD-10-CM | POA: Diagnosis not present

## 2016-10-24 DIAGNOSIS — I480 Paroxysmal atrial fibrillation: Secondary | ICD-10-CM

## 2016-10-24 LAB — POCT INR: INR: 1.3

## 2016-10-25 DIAGNOSIS — N3020 Other chronic cystitis without hematuria: Secondary | ICD-10-CM | POA: Diagnosis not present

## 2016-10-25 DIAGNOSIS — N401 Enlarged prostate with lower urinary tract symptoms: Secondary | ICD-10-CM | POA: Diagnosis not present

## 2016-10-25 DIAGNOSIS — Z8551 Personal history of malignant neoplasm of bladder: Secondary | ICD-10-CM | POA: Diagnosis not present

## 2016-11-01 ENCOUNTER — Ambulatory Visit (INDEPENDENT_AMBULATORY_CARE_PROVIDER_SITE_OTHER): Payer: Medicare Other | Admitting: *Deleted

## 2016-11-01 ENCOUNTER — Other Ambulatory Visit: Payer: Self-pay | Admitting: *Deleted

## 2016-11-01 DIAGNOSIS — Z5181 Encounter for therapeutic drug level monitoring: Secondary | ICD-10-CM

## 2016-11-01 DIAGNOSIS — I480 Paroxysmal atrial fibrillation: Secondary | ICD-10-CM | POA: Diagnosis not present

## 2016-11-01 LAB — POCT INR: INR: 1.3

## 2016-11-01 MED ORDER — WARFARIN SODIUM 5 MG PO TABS: 5.0000 mg | ORAL_TABLET | ORAL | 3 refills | Status: DC

## 2016-11-04 DIAGNOSIS — K5901 Slow transit constipation: Secondary | ICD-10-CM | POA: Diagnosis not present

## 2016-11-08 ENCOUNTER — Ambulatory Visit (INDEPENDENT_AMBULATORY_CARE_PROVIDER_SITE_OTHER): Payer: Medicare Other | Admitting: *Deleted

## 2016-11-08 DIAGNOSIS — I480 Paroxysmal atrial fibrillation: Secondary | ICD-10-CM

## 2016-11-08 DIAGNOSIS — Z5181 Encounter for therapeutic drug level monitoring: Secondary | ICD-10-CM

## 2016-11-08 LAB — PROTIME-INR
INR: 1.14
PROTHROMBIN TIME: 14.7 s (ref 11.4–15.2)

## 2016-11-08 LAB — POCT INR: INR: 1.1

## 2016-11-15 ENCOUNTER — Ambulatory Visit (INDEPENDENT_AMBULATORY_CARE_PROVIDER_SITE_OTHER): Payer: Medicare Other | Admitting: Pharmacist Clinician (PhC)/ Clinical Pharmacy Specialist

## 2016-11-15 DIAGNOSIS — I480 Paroxysmal atrial fibrillation: Secondary | ICD-10-CM | POA: Diagnosis not present

## 2016-11-15 DIAGNOSIS — Z5181 Encounter for therapeutic drug level monitoring: Secondary | ICD-10-CM | POA: Diagnosis not present

## 2016-11-15 LAB — POCT INR: INR: 1.2

## 2016-11-20 ENCOUNTER — Ambulatory Visit (INDEPENDENT_AMBULATORY_CARE_PROVIDER_SITE_OTHER): Payer: Medicare Other | Admitting: *Deleted

## 2016-11-20 DIAGNOSIS — I480 Paroxysmal atrial fibrillation: Secondary | ICD-10-CM | POA: Diagnosis not present

## 2016-11-20 DIAGNOSIS — Z5181 Encounter for therapeutic drug level monitoring: Secondary | ICD-10-CM

## 2016-11-20 LAB — POCT INR: INR: 1.5

## 2016-11-28 ENCOUNTER — Encounter (INDEPENDENT_AMBULATORY_CARE_PROVIDER_SITE_OTHER): Payer: Self-pay

## 2016-11-28 ENCOUNTER — Ambulatory Visit (INDEPENDENT_AMBULATORY_CARE_PROVIDER_SITE_OTHER): Payer: Medicare Other | Admitting: *Deleted

## 2016-11-28 DIAGNOSIS — I480 Paroxysmal atrial fibrillation: Secondary | ICD-10-CM

## 2016-11-28 DIAGNOSIS — Z5181 Encounter for therapeutic drug level monitoring: Secondary | ICD-10-CM | POA: Diagnosis not present

## 2016-11-28 LAB — POCT INR: INR: 1.2

## 2016-11-29 ENCOUNTER — Other Ambulatory Visit: Payer: Self-pay | Admitting: *Deleted

## 2016-11-29 MED ORDER — WARFARIN SODIUM 5 MG PO TABS: 5.0000 mg | ORAL_TABLET | ORAL | 3 refills | Status: DC

## 2016-12-09 ENCOUNTER — Ambulatory Visit (INDEPENDENT_AMBULATORY_CARE_PROVIDER_SITE_OTHER): Payer: Medicare Other | Admitting: *Deleted

## 2016-12-09 ENCOUNTER — Telehealth: Payer: Self-pay | Admitting: Neurology

## 2016-12-09 ENCOUNTER — Encounter: Payer: Self-pay | Admitting: Neurology

## 2016-12-09 ENCOUNTER — Ambulatory Visit (INDEPENDENT_AMBULATORY_CARE_PROVIDER_SITE_OTHER): Payer: Medicare Other | Admitting: Neurology

## 2016-12-09 VITALS — BP 129/70 | HR 57 | Ht 69.5 in | Wt 227.0 lb

## 2016-12-09 DIAGNOSIS — G250 Essential tremor: Secondary | ICD-10-CM | POA: Diagnosis not present

## 2016-12-09 DIAGNOSIS — Z5181 Encounter for therapeutic drug level monitoring: Secondary | ICD-10-CM | POA: Diagnosis not present

## 2016-12-09 DIAGNOSIS — I480 Paroxysmal atrial fibrillation: Secondary | ICD-10-CM | POA: Diagnosis not present

## 2016-12-09 LAB — POCT INR: INR: 2.1

## 2016-12-09 NOTE — Progress Notes (Signed)
Reason for visit:  Essential tremor  Douglas Walker is an 82 y.o. male  History of present illness:   Douglas Walker is an 82 year old right-handed white male with a history of an essential tremor that affects both upper extremities and the jaw. The patient has significantly impaired vision secondary to macular degeneration, and he does not operate a motor vehicle. He has had some benefit with the Topamax initially, but increasing the dose did not benefit him. The patient is on Primidone taking 250 mg twice daily. The patient is also on Coumadin therapy. The patient indicates that his jaw tremor will keep him awake at night. He is able to feed himself with some difficulty, he does not do much handwriting. He has difficulty using tools such as screwdrivers or pliers. He comes to this office for an evaluation.  Past Medical History:  Diagnosis Date  . Coronary artery disease   . Essential tremor 02/28/2016  . GERD (gastroesophageal reflux disease)   . Hernia   . Hyperlipemia   . Hypertension   . Macular degeneration   . Paroxysmal atrial fibrillation ([place]) 10/28/2013  . Tachycardia-bradycardia syndrome ([place]) 09/13/2014  . TIA (transient ischemic attack)     Past Surgical History:  Procedure Laterality Date  . APPENDECTOMY    . HERNIA REPAIR    . HERNIA REPAIR      Family History  Problem Relation Age of Onset  . GI problems Mother   . Other Sister     PAIN ISSUES  . Hearing loss Sister   . Blindness Sister   . Heart attack Neg Hx   . Stroke Neg Hx     Social history:  reports that he quit smoking about 53 years ago. He has never used smokeless tobacco. He reports that he does not drink alcohol or use drugs.    Allergies  Allergen Reactions  . Sulfa Antibiotics Other (See Comments)    Weakness, dizziness    Medications:  Prior to Admission medications   Medication Sig Start Date End Date Taking? Authorizing Provider  acetaminophen (TYLENOL) 500 MG tablet Take  1,000 mg by mouth every 6 (six) hours as needed for mild pain.   Yes Historical Provider, MD  beta carotene w/minerals (OCUVITE) tablet Take 2 tablets by mouth daily.    Yes Historical Provider, MD  DULoxetine (CYMBALTA) 20 MG capsule Take 20 mg by mouth 2 (two) times daily.   Yes Historical Provider, MD  furosemide (LASIX) 40 MG tablet  11/15/16  Yes Historical Provider, MD  meclizine (ANTIVERT) 25 MG tablet Take 25 mg by mouth 2 (two) times daily as needed for dizziness.   Yes Historical Provider, MD  primidone (MYSOLINE) 250 MG tablet Take 250 mg by mouth daily. 08/18/15  Yes Historical Provider, MD  Tamsulosin HCl (FLOMAX) 0.4 MG CAPS Take 0.4 mg by mouth daily after breakfast.    Yes Historical Provider, MD  topiramate (TOPAMAX) 25 MG tablet Two tablets in the morning and one in the evening 07/01/16  Yes Kathrynn Ducking, MD  traMADol (ULTRAM) 50 MG tablet Take 50 mg by mouth every 6 (six) hours as needed for moderate pain.   Yes Historical Provider, MD  valsartan (DIOVAN) 160 MG tablet Take 160 mg by mouth daily. 05/28/16  Yes Historical Provider, MD  warfarin (COUMADIN) 5 MG tablet Take 1 tablet (5 mg total) by mouth as directed. 11/29/16  Yes Belva Crome, MD    ROS:  Out of a complete  14 system review of symptoms, the patient complains only of the following symptoms, and all other reviewed systems are negative.   Hearing loss  Loss of vision  Frequency of urination, urinary urgency  Back pain , joint pain, achy muscles, walking difficulty  Daytime drowsiness, snoring  Numbness, tremors  Depression  Blood pressure 129/70, pulse (!) 57, height 5' 9.5" (1.765 m), weight 227 lb (103 kg).  Physical Exam  General: The patient is alert and cooperative at the time of the examination. The patient is moderately obese.  Skin: No significant peripheral edema is noted.   Neurologic Exam  Mental status: The patient is alert and oriented x 3 at the time of the examination. The patient has  apparent normal recent and remote memory, with an apparently normal attention span and concentration ability.   Cranial nerves: Facial symmetry is present. Speech is normal, no aphasia or dysarthria is noted. Extraocular movements are full. Visual fields are full. A prominent jaw tremor is seen.  Motor: The patient has good strength in all 4 extremities.  Sensory examination: Soft touch sensation is symmetric on the face, arms, and legs.  Coordination: The patient has good finger-nose-finger and heel-to-shin bilaterally. Prominent tremors are seen with finger-nose-finger bilaterally. The patient was able to draw a spiral with minimal tremor.  Gait and station: The patient has a slightly wide-based gait, the patient uses a cane for ambulation. Tandem gait was not attempted. Romberg is negative. No drift is seen.  Reflexes: Deep tendon reflexes are symmetric.   Assessment/Plan:   1. Essential tremor   The patient will have blood work checked today to see if the primidone dosing can be increased slightly. The patient has had significant ankle swelling from gabapentin in the past. He is to continue the current dose of Topamax, he will follow-up in 6 months.  Jill Alexanders MD 12/09/2016 3:02 PM  [facility] [address] [place], [place] [address]  Phone [phone] Fax [phone]

## 2016-12-09 NOTE — Telephone Encounter (Signed)
Med list updated

## 2016-12-09 NOTE — Telephone Encounter (Signed)
Patient stated at checkout that he is taking primidone 250 mg TWICE daily (not once).

## 2016-12-10 ENCOUNTER — Telehealth: Payer: Self-pay | Admitting: Neurology

## 2016-12-10 LAB — PRIMIDONE, SERUM
PRIMIDONE LVL: 12.1 ug/mL — AB (ref 5.0–12.0)
Phenobarbital, Serum: 15 ug/mL (ref 15–40)

## 2016-12-10 NOTE — Telephone Encounter (Signed)
I called the patient. The patient has had blood levels of tremor known that are just over the therapeutic range. Should probably not increase the dose.  Could increase the dose of the Topamax, the patient is willing to go up to 50 mg twice daily of this medication. He will call me if he needs any other dose adjustments.

## 2016-12-13 DIAGNOSIS — G629 Polyneuropathy, unspecified: Secondary | ICD-10-CM | POA: Diagnosis not present

## 2016-12-13 DIAGNOSIS — R609 Edema, unspecified: Secondary | ICD-10-CM | POA: Diagnosis not present

## 2016-12-13 DIAGNOSIS — K5901 Slow transit constipation: Secondary | ICD-10-CM | POA: Diagnosis not present

## 2016-12-13 DIAGNOSIS — I5030 Unspecified diastolic (congestive) heart failure: Secondary | ICD-10-CM | POA: Diagnosis not present

## 2016-12-13 DIAGNOSIS — I10 Essential (primary) hypertension: Secondary | ICD-10-CM | POA: Diagnosis not present

## 2016-12-13 DIAGNOSIS — M7582 Other shoulder lesions, left shoulder: Secondary | ICD-10-CM | POA: Diagnosis not present

## 2016-12-13 DIAGNOSIS — F322 Major depressive disorder, single episode, severe without psychotic features: Secondary | ICD-10-CM | POA: Diagnosis not present

## 2016-12-22 IMAGING — CT CT CHEST W/ CM
2 of 5 series · 13 of 36 positions shown, 16 images · IV contrast (APPLIED)
Comparison: None.

CLINICAL DATA: Fall this morning, neck pain, left rib pain

EXAM:
CT CHEST, ABDOMEN, AND PELVIS WITH CONTRAST
TECHNIQUE: Multidetector CT imaging of the chest, abdomen and pelvis was
performed following the standard protocol during bolus
administration of intravenous contrast.
CONTRAST:  100mL OMNIPAQUE IOHEXOL 300 MG/ML  SOLN

[Series 2: cap 5.0 i31f 1 · axial · 0.86mm/px · z∈[-1274,-694]mm · 10 of 134 slices shown, 13 images]
[im 9/134  mediastinal]
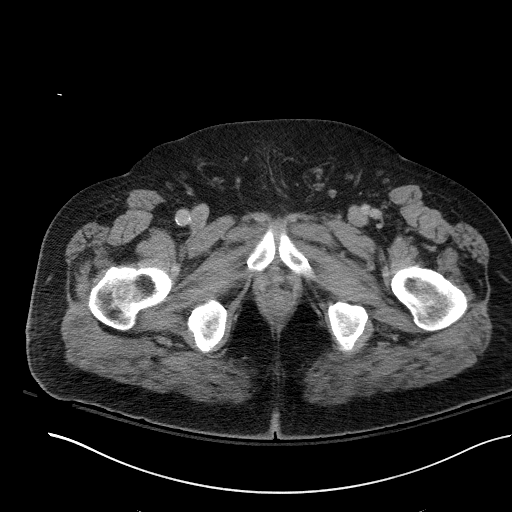
[im 9/134  lung]
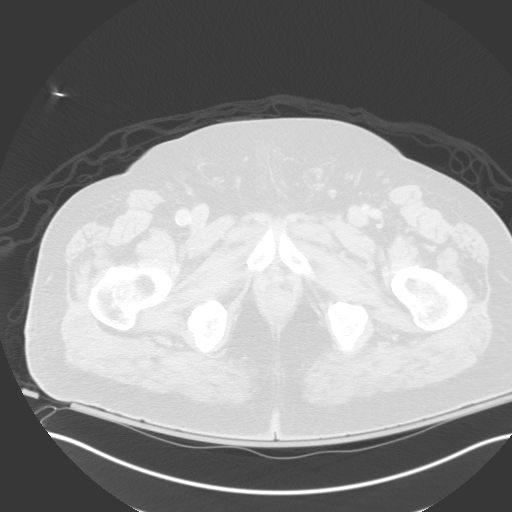
[im 25/134  lung]
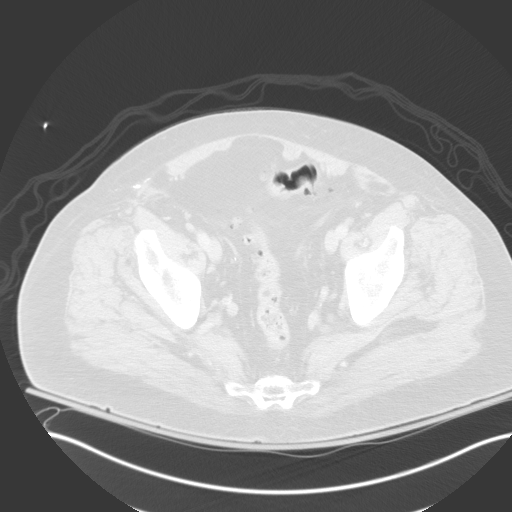
[im 34/134  lung]
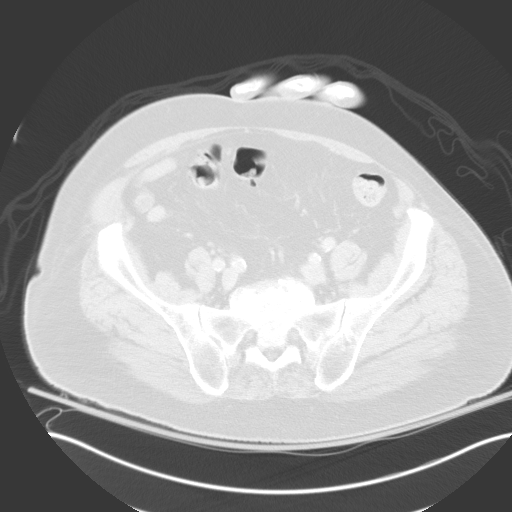
[im 50/134  lung]
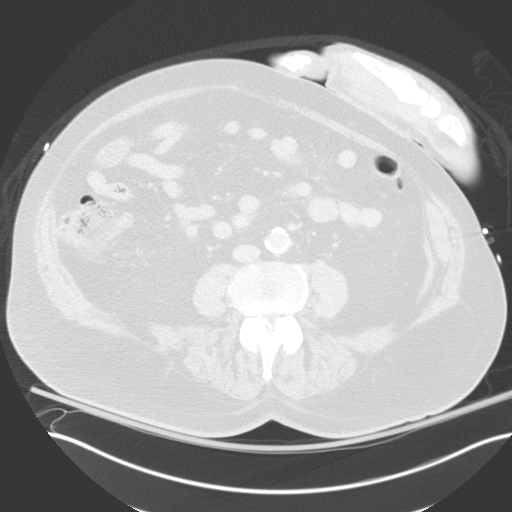
[im 59/134  mediastinal]
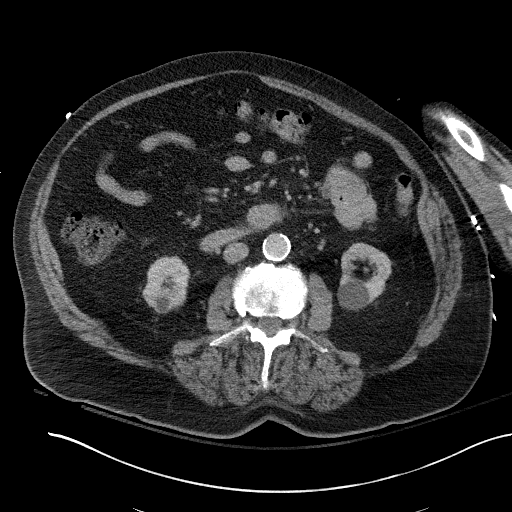
[im 59/134  lung]
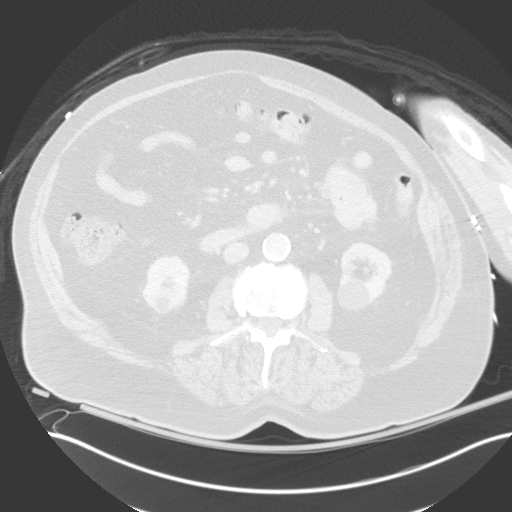
[im 75/134  lung]
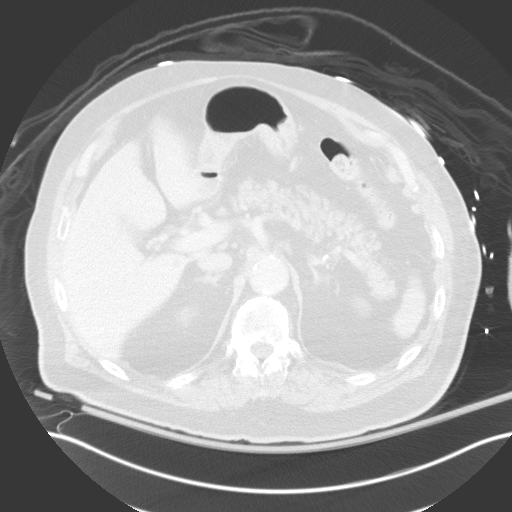
[im 84/134  lung]
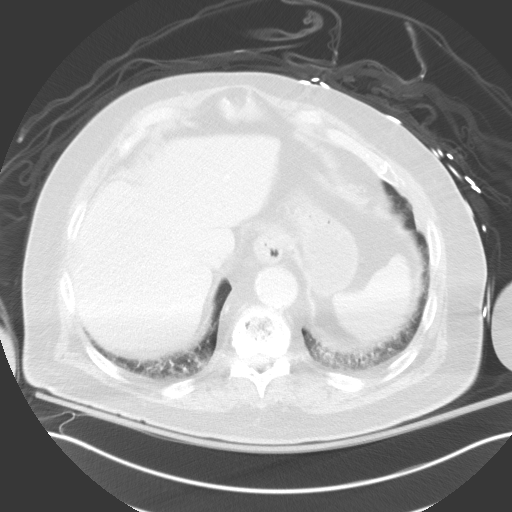
[im 100/134  lung]
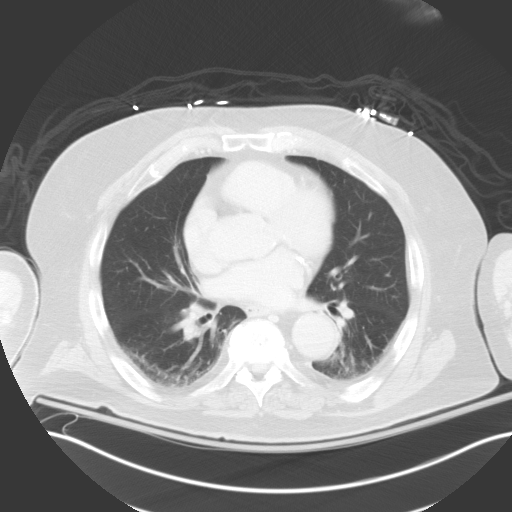
[im 109/134  mediastinal]
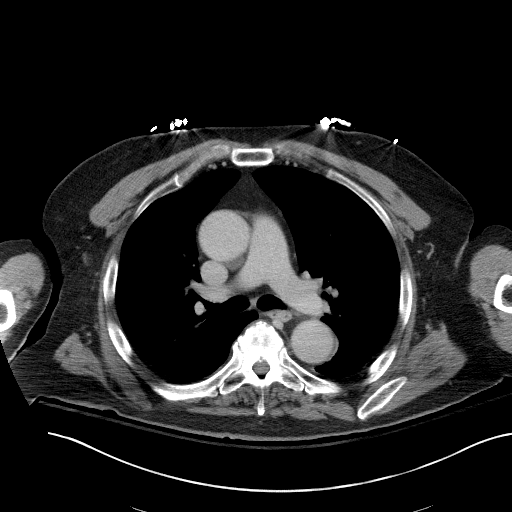
[im 109/134  lung]
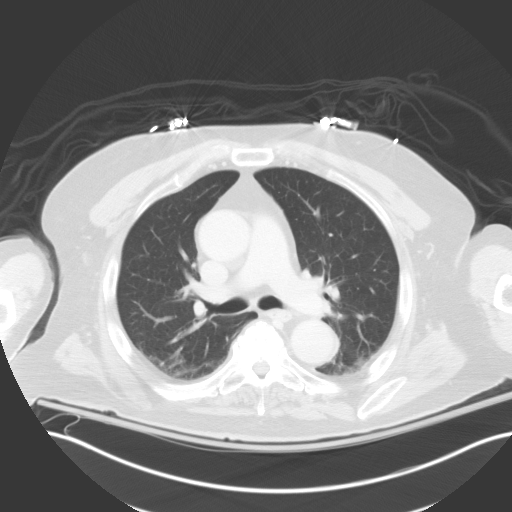
[im 125/134  lung]
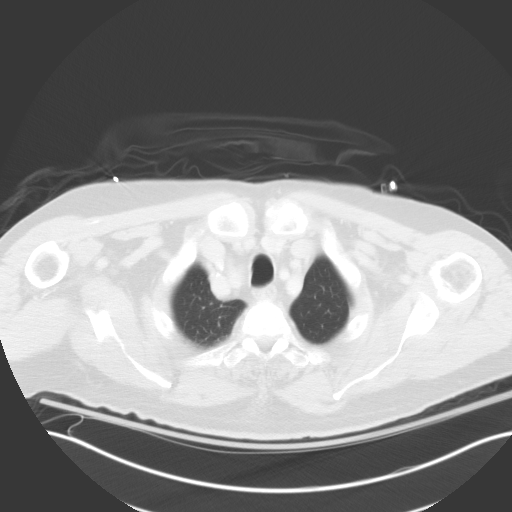

[Series 5: coronal · coronal · 0.85mm/px · 3 of 97 slices shown]
[im 20/97  lung]
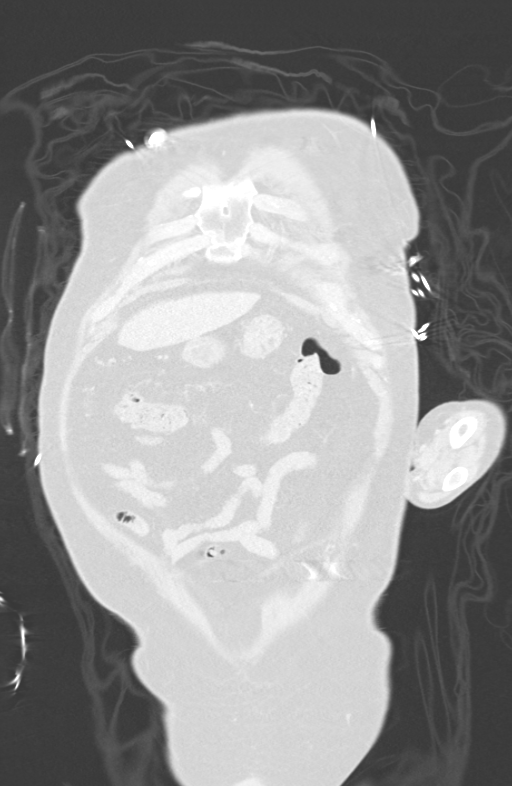
[im 39/97  lung]
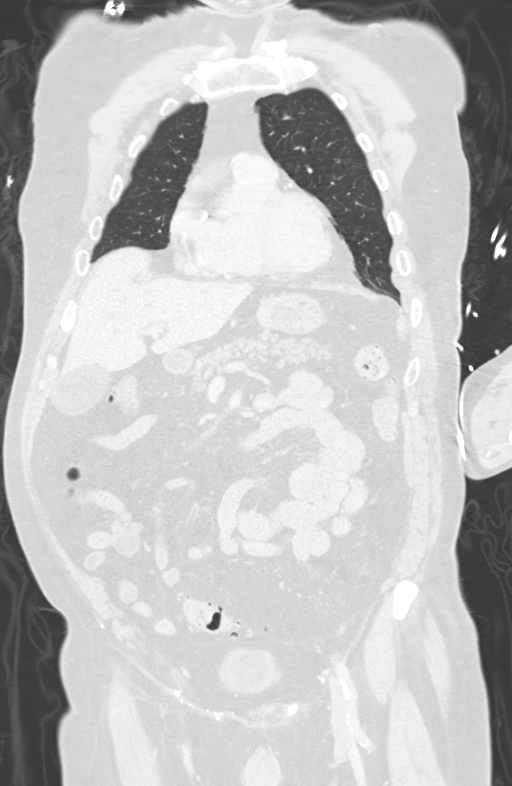
[im 58/97  lung]
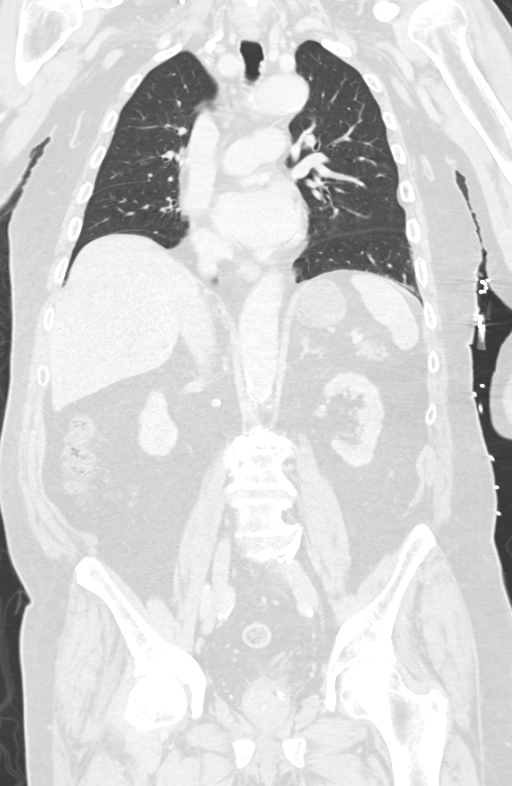

[13 of 36 positions shown; findings below may reference images not displayed]

FINDINGS: CT CHEST FINDINGS

Sagittal images of the spine shows degenerative changes thoracic
spine. Sagittal view of the sternum is unremarkable. No scapular
fracture is noted.

Central airways are patent.

Mild atherosclerotic calcifications of thoracic aorta. No
mediastinal hematoma or adenopathy. Atherosclerotic calcifications
of coronary arteries. Heart size within normal limits. Small hiatal
hernia.

No displaced rib fractures are identified. Mild atelectasis noted
bilateral lung bases posteriorly.

Axial image 49 there is probable subtle nondisplaced fracture in
left lower anterior ribs/left base anteriorly. Probable left sixth
rib. Mild adjacent pleural thickening.

Images of the lung parenchyma shows no lung contusion. There is no
pneumothorax. There is mild displaced fracture of the left lateral
ninth rib see axial image 65.

CT ABDOMEN AND PELVIS FINDINGS

Sagittal images of the spine shows degenerative changes lumbar
spine. No acute fractures are noted within lumbar spine. Mild
degenerative changes bilateral SI joints. No pelvic fractures are
noted.

Enhanced liver shows no laceration. Probable cyst in right hepatic
lobe laterally measures 1 cm. The pancreas, spleen and adrenal
glands are unremarkable. Kidneys are symmetrical in size and
enhancement. A cyst in lower pole of the right kidney measures 1 cm.
Cyst in midpole of the left kidney posterior aspect measures 2.5 cm.
Atherosclerotic calcifications of abdominal aorta and iliac
arteries.

No hydronephrosis or hydroureter. Delayed renal images shows
bilateral renal symmetrical excretion. No evidence of renal
laceration. No calcified gallstones are noted within gallbladder.

No small bowel obstruction.  No ascites or free air.  No adenopathy.

Sigmoid colon diverticula are noted. No evidence of acute
diverticulitis. There is mild thickening of wall of under distended
urinary bladder. Mild cystitis cannot be excluded. Prostate gland
calcifications are noted. The prostate gland is not enlarged.
Seminal vesicles are unremarkable. Mild degenerative changes pubic
symphysis.

Coronal images shows mild degenerative changes bilateral hip joints.

No definite sacral fracture is noted.  No aortic aneurysm.
IMPRESSION: 1. There is subtle nondisplaced fracture of the left anterior sixth
rib. Mild displaced fracture of the left ninth rib.
2. No mediastinal hematoma or adenopathy.
3. No lung contusion or pneumothorax.
4. Small hiatal hernia.
5. No acute visceral injury within abdomen or pelvis.
6. Degenerative changes lumbar spine.
7. Degenerative changes bilateral SI joints and hip joints. Mild
degenerative changes pubic symphysis.
8. No pelvic fractures.
9. Sigmoid colon diverticula are noted. No evidence of acute
diverticulitis.
10. No small bowel obstruction.
11. Mild thickening of wall of under distended urinary bladder. Mild
cystitis cannot be excluded.

## 2016-12-26 ENCOUNTER — Other Ambulatory Visit: Payer: Self-pay | Admitting: *Deleted

## 2016-12-26 DIAGNOSIS — H353133 Nonexudative age-related macular degeneration, bilateral, advanced atrophic without subfoveal involvement: Secondary | ICD-10-CM | POA: Diagnosis not present

## 2016-12-26 DIAGNOSIS — H353211 Exudative age-related macular degeneration, right eye, with active choroidal neovascularization: Secondary | ICD-10-CM | POA: Diagnosis not present

## 2016-12-26 DIAGNOSIS — H35351 Cystoid macular degeneration, right eye: Secondary | ICD-10-CM | POA: Diagnosis not present

## 2016-12-31 ENCOUNTER — Ambulatory Visit (INDEPENDENT_AMBULATORY_CARE_PROVIDER_SITE_OTHER): Payer: Medicare Other | Admitting: Pharmacist

## 2016-12-31 DIAGNOSIS — Z5181 Encounter for therapeutic drug level monitoring: Secondary | ICD-10-CM | POA: Diagnosis not present

## 2016-12-31 DIAGNOSIS — I480 Paroxysmal atrial fibrillation: Secondary | ICD-10-CM | POA: Diagnosis not present

## 2016-12-31 LAB — POCT INR: INR: 1.7

## 2017-01-02 ENCOUNTER — Ambulatory Visit: Payer: Medicare Other | Admitting: Podiatry

## 2017-01-09 ENCOUNTER — Other Ambulatory Visit: Payer: Self-pay | Admitting: Neurology

## 2017-01-09 DIAGNOSIS — N3020 Other chronic cystitis without hematuria: Secondary | ICD-10-CM | POA: Diagnosis not present

## 2017-01-09 DIAGNOSIS — R351 Nocturia: Secondary | ICD-10-CM | POA: Diagnosis not present

## 2017-01-14 ENCOUNTER — Ambulatory Visit (INDEPENDENT_AMBULATORY_CARE_PROVIDER_SITE_OTHER): Payer: Medicare Other | Admitting: *Deleted

## 2017-01-14 DIAGNOSIS — I480 Paroxysmal atrial fibrillation: Secondary | ICD-10-CM

## 2017-01-14 DIAGNOSIS — Z5181 Encounter for therapeutic drug level monitoring: Secondary | ICD-10-CM

## 2017-01-14 LAB — POCT INR: INR: 1.8

## 2017-01-20 ENCOUNTER — Ambulatory Visit (INDEPENDENT_AMBULATORY_CARE_PROVIDER_SITE_OTHER): Payer: Medicare Other | Admitting: Podiatry

## 2017-01-20 DIAGNOSIS — M79676 Pain in unspecified toe(s): Secondary | ICD-10-CM

## 2017-01-20 DIAGNOSIS — B351 Tinea unguium: Secondary | ICD-10-CM | POA: Diagnosis not present

## 2017-01-20 DIAGNOSIS — E1142 Type 2 diabetes mellitus with diabetic polyneuropathy: Secondary | ICD-10-CM

## 2017-01-22 NOTE — Progress Notes (Signed)
Subjective:     Patient ID: Douglas Walker, male   DOB: Aug 09, 1925, 83 y.o.   MRN: [id number]  HPI patient presents with thick yellow painful nailbeds 1-5 both feet that he's been unable to take care of   Review of Systems     Objective:   Physical Exam Neurovascular status intact with thick yellow brittle nailbeds 1-5 both feet with pain    Assessment:     Mycotic nail infection with pain 1-5 both feet    Plan:     Debris painful nailbeds 1-5 both feet with no iatrogenic bleeding noted

## 2017-01-28 ENCOUNTER — Ambulatory Visit (INDEPENDENT_AMBULATORY_CARE_PROVIDER_SITE_OTHER): Payer: Medicare Other | Admitting: *Deleted

## 2017-01-28 DIAGNOSIS — I480 Paroxysmal atrial fibrillation: Secondary | ICD-10-CM | POA: Diagnosis not present

## 2017-01-28 DIAGNOSIS — Z5181 Encounter for therapeutic drug level monitoring: Secondary | ICD-10-CM

## 2017-01-28 LAB — POCT INR: INR: 2.8

## 2017-02-18 ENCOUNTER — Ambulatory Visit (INDEPENDENT_AMBULATORY_CARE_PROVIDER_SITE_OTHER): Payer: Medicare Other | Admitting: *Deleted

## 2017-02-18 DIAGNOSIS — I480 Paroxysmal atrial fibrillation: Secondary | ICD-10-CM

## 2017-02-18 DIAGNOSIS — Z5181 Encounter for therapeutic drug level monitoring: Secondary | ICD-10-CM | POA: Diagnosis not present

## 2017-02-18 LAB — POCT INR: INR: 3.6

## 2017-03-05 ENCOUNTER — Ambulatory Visit (INDEPENDENT_AMBULATORY_CARE_PROVIDER_SITE_OTHER): Payer: Medicare Other | Admitting: *Deleted

## 2017-03-05 DIAGNOSIS — I480 Paroxysmal atrial fibrillation: Secondary | ICD-10-CM

## 2017-03-05 DIAGNOSIS — Z5181 Encounter for therapeutic drug level monitoring: Secondary | ICD-10-CM

## 2017-03-05 LAB — POCT INR: INR: 3.1

## 2017-03-19 ENCOUNTER — Ambulatory Visit (INDEPENDENT_AMBULATORY_CARE_PROVIDER_SITE_OTHER): Payer: Medicare Other | Admitting: *Deleted

## 2017-03-19 DIAGNOSIS — Z5181 Encounter for therapeutic drug level monitoring: Secondary | ICD-10-CM | POA: Diagnosis not present

## 2017-03-19 DIAGNOSIS — I480 Paroxysmal atrial fibrillation: Secondary | ICD-10-CM

## 2017-03-19 LAB — POCT INR: INR: 2.4

## 2017-03-27 DIAGNOSIS — H353212 Exudative age-related macular degeneration, right eye, with inactive choroidal neovascularization: Secondary | ICD-10-CM | POA: Diagnosis not present

## 2017-03-27 DIAGNOSIS — H3561 Retinal hemorrhage, right eye: Secondary | ICD-10-CM | POA: Diagnosis not present

## 2017-03-27 DIAGNOSIS — H353133 Nonexudative age-related macular degeneration, bilateral, advanced atrophic without subfoveal involvement: Secondary | ICD-10-CM | POA: Diagnosis not present

## 2017-03-27 DIAGNOSIS — H353222 Exudative age-related macular degeneration, left eye, with inactive choroidal neovascularization: Secondary | ICD-10-CM | POA: Diagnosis not present

## 2017-04-02 ENCOUNTER — Other Ambulatory Visit: Payer: Self-pay | Admitting: Interventional Cardiology

## 2017-04-09 ENCOUNTER — Ambulatory Visit (INDEPENDENT_AMBULATORY_CARE_PROVIDER_SITE_OTHER): Payer: Medicare Other | Admitting: *Deleted

## 2017-04-09 DIAGNOSIS — I480 Paroxysmal atrial fibrillation: Secondary | ICD-10-CM | POA: Diagnosis not present

## 2017-04-09 DIAGNOSIS — Z5181 Encounter for therapeutic drug level monitoring: Secondary | ICD-10-CM

## 2017-04-09 LAB — POCT INR: INR: 3.3

## 2017-04-18 DIAGNOSIS — R609 Edema, unspecified: Secondary | ICD-10-CM | POA: Diagnosis not present

## 2017-04-18 DIAGNOSIS — M7582 Other shoulder lesions, left shoulder: Secondary | ICD-10-CM | POA: Diagnosis not present

## 2017-04-18 DIAGNOSIS — R351 Nocturia: Secondary | ICD-10-CM | POA: Diagnosis not present

## 2017-04-22 ENCOUNTER — Ambulatory Visit (INDEPENDENT_AMBULATORY_CARE_PROVIDER_SITE_OTHER): Payer: Medicare Other | Admitting: Podiatry

## 2017-04-22 ENCOUNTER — Encounter: Payer: Self-pay | Admitting: Podiatry

## 2017-04-22 DIAGNOSIS — M79676 Pain in unspecified toe(s): Secondary | ICD-10-CM

## 2017-04-22 DIAGNOSIS — B351 Tinea unguium: Secondary | ICD-10-CM

## 2017-04-22 DIAGNOSIS — E1142 Type 2 diabetes mellitus with diabetic polyneuropathy: Secondary | ICD-10-CM

## 2017-04-22 NOTE — Progress Notes (Signed)
He presents today with chief complaint of painful elongated toenails bilateral.  Objective: Vital signs are stable alert and oriented 3. Pulses are palpable. His toenails are long thick yellow dystrophic lytic mycotic.  Assessment: Pain limb secondary onychomycosis.  Plan: Debridement of toenails 1 through 5 bilateral.

## 2017-04-30 ENCOUNTER — Ambulatory Visit (INDEPENDENT_AMBULATORY_CARE_PROVIDER_SITE_OTHER): Payer: Medicare Other | Admitting: *Deleted

## 2017-04-30 DIAGNOSIS — I480 Paroxysmal atrial fibrillation: Secondary | ICD-10-CM | POA: Diagnosis not present

## 2017-04-30 DIAGNOSIS — Z5181 Encounter for therapeutic drug level monitoring: Secondary | ICD-10-CM

## 2017-04-30 LAB — POCT INR: INR: 1.8

## 2017-05-21 ENCOUNTER — Ambulatory Visit (INDEPENDENT_AMBULATORY_CARE_PROVIDER_SITE_OTHER): Payer: Medicare Other | Admitting: *Deleted

## 2017-05-21 DIAGNOSIS — I480 Paroxysmal atrial fibrillation: Secondary | ICD-10-CM | POA: Diagnosis not present

## 2017-05-21 DIAGNOSIS — Z5181 Encounter for therapeutic drug level monitoring: Secondary | ICD-10-CM | POA: Diagnosis not present

## 2017-05-21 LAB — POCT INR: INR: 2.2

## 2017-06-10 ENCOUNTER — Encounter: Payer: Self-pay | Admitting: Neurology

## 2017-06-10 ENCOUNTER — Ambulatory Visit (INDEPENDENT_AMBULATORY_CARE_PROVIDER_SITE_OTHER): Payer: Medicare Other | Admitting: Neurology

## 2017-06-10 VITALS — BP 165/73 | HR 45 | Ht 69.5 in | Wt 221.0 lb

## 2017-06-10 DIAGNOSIS — G250 Essential tremor: Secondary | ICD-10-CM | POA: Diagnosis not present

## 2017-06-10 MED ORDER — TOPIRAMATE 50 MG PO TABS: 50.0000 mg | ORAL_TABLET | Freq: Two times a day (BID) | ORAL | 3 refills | Status: DC

## 2017-06-10 NOTE — Patient Instructions (Signed)
   We will continue Topamax 50 mg tablets, take one twice a day.

## 2017-06-10 NOTE — Progress Notes (Signed)
Reason for visit: Essential tremor  Douglas Walker is an 83 y.o. male  History of present illness:  Douglas Walker is an 83 year old right-handed white male with a history of an essential tremor that affects the jaw and both upper extremities. The patient has been on Mysoline, he is on a maximum dose taking 250 mg twice daily. He is on Topamax as well, he is not sure that this has helped, he has good days and bad days with the tremor. He is not sleeping well at night because of urinary frequency, he now has to get up 3 or 4 times each night to urinate. The patient does have some balance issues, uses a cane for ambulation, he has not reported any falls, he believes that his walking has actually improved over the last year. The patient does have decreased visual acuity, he does not operate a motor vehicle. He returns to this office for an evaluation.  Past Medical History:  Diagnosis Date  . Coronary artery disease   . Essential tremor 02/28/2016  . GERD (gastroesophageal reflux disease)   . Hernia   . Hyperlipemia   . Hypertension   . Macular degeneration   . Paroxysmal atrial fibrillation ([place]) 10/28/2013  . Tachycardia-bradycardia syndrome ([place]) 09/13/2014  . TIA (transient ischemic attack)     Past Surgical History:  Procedure Laterality Date  . APPENDECTOMY    . HERNIA REPAIR    . HERNIA REPAIR      Family History  Problem Relation Age of Onset  . GI problems Mother   . Other Sister        PAIN ISSUES  . Hearing loss Sister   . Blindness Sister   . Heart attack Neg Hx   . Stroke Neg Hx     Social history:  reports that he quit smoking about 54 years ago. He has never used smokeless tobacco. He reports that he does not drink alcohol or use drugs.    Allergies  Allergen Reactions  . Sulfa Antibiotics Other (See Comments)    Weakness, dizziness    Medications:  Prior to Admission medications   Medication Sig Start Date End Date Taking? Authorizing Provider    acetaminophen (TYLENOL) 500 MG tablet Take 1,000 mg by mouth every 6 (six) hours as needed for mild pain.   Yes [provider]  beta carotene w/minerals (OCUVITE) tablet Take 2 tablets by mouth daily.    Yes [provider]  DULoxetine (CYMBALTA) 20 MG capsule Take 20 mg by mouth 2 (two) times daily.   Yes [provider]  furosemide (LASIX) 40 MG tablet  11/15/16  Yes [provider]  meclizine (ANTIVERT) 25 MG tablet Take 25 mg by mouth 2 (two) times daily as needed for dizziness.   Yes [provider]  Mirabegron (MYRBETRIQ PO) Take 25 mg by mouth daily.    Yes [provider]  primidone (MYSOLINE) 250 MG tablet Take 250 mg by mouth 2 (two) times daily.  08/18/15  Yes [provider]  Tamsulosin HCl (FLOMAX) 0.4 MG CAPS Take 0.4 mg by mouth daily after breakfast.    Yes [provider]  topiramate (TOPAMAX) 25 MG tablet TWO TABLETS IN THE MORNING AND ONE IN THE EVENING Patient taking differently: TWO TABLETS IN THE MORNING AND two IN THE EVENING 01/09/17  Yes Kathrynn Ducking, MD  traMADol (ULTRAM) 50 MG tablet Take 50 mg by mouth every 6 (six) hours as needed for moderate pain.  Yes [provider]  valsartan (DIOVAN) 160 MG tablet Take 160 mg by mouth daily. 05/28/16  Yes [provider]  warfarin (COUMADIN) 5 MG tablet TAKE 1 TABLET (5 MG TOTAL) BY MOUTH AS DIRECTED. 04/02/17  Yes Belva Crome, MD    ROS:  Out of a complete 14 system review of symptoms, the patient complains only of the following symptoms, and all other reviewed systems are negative.  Hearing loss, runny nose Loss of vision, blurred vision Snoring Frequency of urination Joint pain, back pain, achy muscles, walking difficulty, neck pain Tremors  Blood pressure (!) 165/73, pulse (!) 45, height 5' 9.5" (1.765 m), weight 221 lb (100.2 kg).  Physical Exam  General: The patient is alert and cooperative at the time of the  examination.  Skin: 2+ edema below the knees is seen bilaterally.   Neurologic Exam  Mental status: The patient is alert and oriented x 3 at the time of the examination. The patient has apparent normal recent and remote memory, with an apparently normal attention span and concentration ability.   Cranial nerves: Facial symmetry is present. Speech is normal, no aphasia or dysarthria is noted. Extraocular movements are full. Visual fields are full. A jaw tremor is noted.  Motor: The patient has good strength in all 4 extremities.  Sensory examination: Soft touch sensation is symmetric on the face, arms, and legs.  Coordination: The patient has good finger-nose-finger and heel-to-shin bilaterally. The patient does have an intention tremor with both upper extremities with finger-nose-finger.  Gait and station: The patient has a slightly wide-based gait, the patient walks with a cane. Tandem gait was not attempted. Romberg is negative. No drift is seen.  Reflexes: Deep tendon reflexes are symmetric.   Assessment/Plan:  1. Essential tremor  2. Gait disorder  The patient is having good days and bad days with the tremor. He is not sleeping well, this could have some impact on the tremor. He is having significant urinary frequency night. He will go up on the Topamax dose to 50 mg twice daily, the prescription was sent in. The patient will follow up in 6 months.   Jill Alexanders MD 06/10/2017 10:16 AM  [facility] [address] [place],  [address]  Phone [phone] Fax [phone]

## 2017-06-18 ENCOUNTER — Ambulatory Visit (INDEPENDENT_AMBULATORY_CARE_PROVIDER_SITE_OTHER): Payer: Medicare Other | Admitting: *Deleted

## 2017-06-18 DIAGNOSIS — Z5181 Encounter for therapeutic drug level monitoring: Secondary | ICD-10-CM | POA: Diagnosis not present

## 2017-06-18 DIAGNOSIS — I480 Paroxysmal atrial fibrillation: Secondary | ICD-10-CM | POA: Diagnosis not present

## 2017-06-18 LAB — POCT INR: INR: 3.1

## 2017-06-18 MED ORDER — WARFARIN SODIUM 5 MG PO TABS: ORAL_TABLET | ORAL | 0 refills | Status: DC

## 2017-07-15 DIAGNOSIS — H353212 Exudative age-related macular degeneration, right eye, with inactive choroidal neovascularization: Secondary | ICD-10-CM | POA: Diagnosis not present

## 2017-07-15 DIAGNOSIS — H353133 Nonexudative age-related macular degeneration, bilateral, advanced atrophic without subfoveal involvement: Secondary | ICD-10-CM | POA: Diagnosis not present

## 2017-07-15 DIAGNOSIS — H3561 Retinal hemorrhage, right eye: Secondary | ICD-10-CM | POA: Diagnosis not present

## 2017-07-15 DIAGNOSIS — H353222 Exudative age-related macular degeneration, left eye, with inactive choroidal neovascularization: Secondary | ICD-10-CM | POA: Diagnosis not present

## 2017-07-16 ENCOUNTER — Ambulatory Visit (INDEPENDENT_AMBULATORY_CARE_PROVIDER_SITE_OTHER): Payer: Medicare Other | Admitting: *Deleted

## 2017-07-16 DIAGNOSIS — I480 Paroxysmal atrial fibrillation: Secondary | ICD-10-CM

## 2017-07-16 DIAGNOSIS — Z5181 Encounter for therapeutic drug level monitoring: Secondary | ICD-10-CM | POA: Diagnosis not present

## 2017-07-16 LAB — POCT INR: INR: 3.9

## 2017-07-22 ENCOUNTER — Ambulatory Visit (INDEPENDENT_AMBULATORY_CARE_PROVIDER_SITE_OTHER): Payer: Medicare Other | Admitting: Podiatry

## 2017-07-22 ENCOUNTER — Encounter: Payer: Self-pay | Admitting: Podiatry

## 2017-07-22 DIAGNOSIS — B351 Tinea unguium: Secondary | ICD-10-CM

## 2017-07-22 DIAGNOSIS — M79676 Pain in unspecified toe(s): Secondary | ICD-10-CM

## 2017-07-22 DIAGNOSIS — E1142 Type 2 diabetes mellitus with diabetic polyneuropathy: Secondary | ICD-10-CM

## 2017-07-22 NOTE — Progress Notes (Signed)
Presents with his son today for chief complaint of painful elongated toenails.  Objective: Pulses are palpable. Neurologic sensorium is intact. No open lesions or wounds are noted.  Assessment: Pain elicited onychomycosis.  Plan: Debridement toenails 1 through 5 bilateral.

## 2017-07-30 ENCOUNTER — Ambulatory Visit (INDEPENDENT_AMBULATORY_CARE_PROVIDER_SITE_OTHER): Payer: Medicare Other | Admitting: *Deleted

## 2017-07-30 DIAGNOSIS — Z5181 Encounter for therapeutic drug level monitoring: Secondary | ICD-10-CM

## 2017-07-30 DIAGNOSIS — I480 Paroxysmal atrial fibrillation: Secondary | ICD-10-CM

## 2017-07-30 LAB — POCT INR: INR: 3

## 2017-08-20 ENCOUNTER — Ambulatory Visit (INDEPENDENT_AMBULATORY_CARE_PROVIDER_SITE_OTHER): Payer: Medicare Other

## 2017-08-20 DIAGNOSIS — I480 Paroxysmal atrial fibrillation: Secondary | ICD-10-CM | POA: Diagnosis not present

## 2017-08-20 DIAGNOSIS — Z5181 Encounter for therapeutic drug level monitoring: Secondary | ICD-10-CM

## 2017-08-20 LAB — POCT INR: INR: 3.3

## 2017-08-27 DIAGNOSIS — I5030 Unspecified diastolic (congestive) heart failure: Secondary | ICD-10-CM | POA: Diagnosis not present

## 2017-08-27 DIAGNOSIS — I10 Essential (primary) hypertension: Secondary | ICD-10-CM | POA: Diagnosis not present

## 2017-08-27 DIAGNOSIS — K5901 Slow transit constipation: Secondary | ICD-10-CM | POA: Diagnosis not present

## 2017-08-27 DIAGNOSIS — Z0001 Encounter for general adult medical examination with abnormal findings: Secondary | ICD-10-CM | POA: Diagnosis not present

## 2017-08-27 DIAGNOSIS — M7582 Other shoulder lesions, left shoulder: Secondary | ICD-10-CM | POA: Diagnosis not present

## 2017-08-27 DIAGNOSIS — F322 Major depressive disorder, single episode, severe without psychotic features: Secondary | ICD-10-CM | POA: Diagnosis not present

## 2017-08-27 DIAGNOSIS — R351 Nocturia: Secondary | ICD-10-CM | POA: Diagnosis not present

## 2017-08-27 DIAGNOSIS — G629 Polyneuropathy, unspecified: Secondary | ICD-10-CM | POA: Diagnosis not present

## 2017-08-27 DIAGNOSIS — I672 Cerebral atherosclerosis: Secondary | ICD-10-CM | POA: Diagnosis not present

## 2017-08-27 DIAGNOSIS — R609 Edema, unspecified: Secondary | ICD-10-CM | POA: Diagnosis not present

## 2017-08-27 DIAGNOSIS — Z79899 Other long term (current) drug therapy: Secondary | ICD-10-CM | POA: Diagnosis not present

## 2017-09-10 ENCOUNTER — Ambulatory Visit (INDEPENDENT_AMBULATORY_CARE_PROVIDER_SITE_OTHER): Payer: Medicare Other | Admitting: *Deleted

## 2017-09-10 DIAGNOSIS — Z5181 Encounter for therapeutic drug level monitoring: Secondary | ICD-10-CM

## 2017-09-10 DIAGNOSIS — I480 Paroxysmal atrial fibrillation: Secondary | ICD-10-CM

## 2017-09-10 LAB — POCT INR: INR: 2.2

## 2017-09-29 ENCOUNTER — Encounter: Payer: Self-pay | Admitting: Interventional Cardiology

## 2017-09-29 ENCOUNTER — Ambulatory Visit (INDEPENDENT_AMBULATORY_CARE_PROVIDER_SITE_OTHER): Payer: Medicare Other | Admitting: *Deleted

## 2017-09-29 ENCOUNTER — Ambulatory Visit (INDEPENDENT_AMBULATORY_CARE_PROVIDER_SITE_OTHER): Payer: Medicare Other | Admitting: Interventional Cardiology

## 2017-09-29 VITALS — BP 132/56 | HR 63 | Ht 69.5 in | Wt 218.4 lb

## 2017-09-29 DIAGNOSIS — I10 Essential (primary) hypertension: Secondary | ICD-10-CM

## 2017-09-29 DIAGNOSIS — I5032 Chronic diastolic (congestive) heart failure: Secondary | ICD-10-CM

## 2017-09-29 DIAGNOSIS — N183 Chronic kidney disease, stage 3 unspecified: Secondary | ICD-10-CM

## 2017-09-29 DIAGNOSIS — I495 Sick sinus syndrome: Secondary | ICD-10-CM

## 2017-09-29 DIAGNOSIS — R55 Syncope and collapse: Secondary | ICD-10-CM | POA: Diagnosis not present

## 2017-09-29 DIAGNOSIS — Z7901 Long term (current) use of anticoagulants: Secondary | ICD-10-CM | POA: Diagnosis not present

## 2017-09-29 DIAGNOSIS — I480 Paroxysmal atrial fibrillation: Secondary | ICD-10-CM

## 2017-09-29 DIAGNOSIS — Z5181 Encounter for therapeutic drug level monitoring: Secondary | ICD-10-CM

## 2017-09-29 LAB — POCT INR: INR: 1.7

## 2017-09-29 NOTE — Progress Notes (Signed)
Cardiology Office Note    Date:  09/29/2017   ID:  Douglas Walker, DOB 1925/06/15, MRN [id number]  PCP:  Josetta Huddle, MD  Cardiologist: Sinclair Grooms, MD   No chief complaint on file.   History of Present Illness:  Douglas Walker is a 83 y.o. male for tachycardia-bradycardia syndrome per 30 day monitor, history of recurring syncopal episodes, history of paroxysmal atrial fibrillation, chronic anticoagulation therapy, prior history of TIA,  and coronary artery disease. He was seen by EP, Dr. Beckie Salts. It was felt that pacemaker was not indicated at that time   Doing well without cardiac complaints. He is very limited because vision is poor, he tires easily, and feels weak. He denies chest pain, or dyspnea, PND, lower extremity swelling, and syncope.  Past Medical History:  Diagnosis Date  . Coronary artery disease   . Essential tremor 02/28/2016  . GERD (gastroesophageal reflux disease)   . Hernia   . Hyperlipemia   . Hypertension   . Macular degeneration   . Paroxysmal atrial fibrillation ([place]) 10/28/2013  . Tachycardia-bradycardia syndrome ([place]) 09/13/2014  . TIA (transient ischemic attack)     Past Surgical History:  Procedure Laterality Date  . APPENDECTOMY    . HERNIA REPAIR    . HERNIA REPAIR      Current Medications: Outpatient Medications Prior to Visit  Medication Sig Dispense Refill  . acetaminophen (TYLENOL) 500 MG tablet Take 1,000 mg by mouth every 6 (six) hours as needed for mild pain.    . beta carotene w/minerals (OCUVITE) tablet Take 2 tablets by mouth daily.     . DULoxetine (CYMBALTA) 20 MG capsule Take 20 mg by mouth 2 (two) times daily.    . furosemide (LASIX) 40 MG tablet     . meclizine (ANTIVERT) 25 MG tablet Take 25 mg by mouth 2 (two) times daily as needed for dizziness.    . primidone (MYSOLINE) 250 MG tablet Take 250 mg by mouth 2 (two) times daily.   11  . Tamsulosin HCl (FLOMAX) 0.4 MG CAPS Take 0.4 mg by mouth daily after  breakfast.     . topiramate (TOPAMAX) 50 MG tablet Take 1 tablet (50 mg total) by mouth 2 (two) times daily. 180 tablet 3  . traMADol (ULTRAM) 50 MG tablet Take 50 mg by mouth every 6 (six) hours as needed for moderate pain.    Marland Kitchen warfarin (COUMADIN) 5 MG tablet Take as directed by coumadin clinic 165 tablet 0  . valsartan (DIOVAN) 160 MG tablet Take 160 mg by mouth daily.  3   No facility-administered medications prior to visit.      Allergies:   Sulfa antibiotics   Social History   Social History  . Marital status: Widowed    Spouse name: N/A  . Number of children: 1  . Years of education: 10   Occupational History  .      retired   Social History Main Topics  . Smoking status: Former Smoker    Quit date: 12/30/1962  . Smokeless tobacco: Never Used  . Alcohol use No  . Drug use: No  . Sexual activity: No   Other Topics Concern  . None   Social History Narrative   Lives alone   Right-handed   Caffeine use- caffeine free coffee, occas soda     Family History:  The patient's family history includes Blindness in his sister; GI problems in his mother; Hearing loss in his sister;  Other in his sister.   ROS:   Please see the history of present illness.    Some mild lower extremity swelling. Snoring, wheezing, and occasional leg pain. Difficulty with balance, back pain, shoulder pain, muscle pain, depression related to limitations. Easy bruising.  All other systems reviewed and are negative.   PHYSICAL EXAM:   VS:  BP (!) 132/56 (BP Location: Left Arm)   Pulse 63   Ht 5' 9.5" (1.765 m)   Wt 218 lb 6.4 oz (99.1 kg)   BMI 31.79 kg/m    GEN: Well nourished, well developed, in no acute distress  HEENT: normal  Neck: no JVD, carotid bruits, or masses Cardiac: RRR; no murmurs, rubs, or gallops,no edema  Respiratory:  clear to auscultation bilaterally, normal work of breathing GI: soft, nontender, nondistended, + BS MS: no deformity or atrophy  Skin: warm and dry, no  rash Neuro:  Alert and Oriented x 3, Strength and sensation are intact Psych: euthymic mood, full affect  Wt Readings from Last 3 Encounters:  09/29/17 218 lb 6.4 oz (99.1 kg)  06/10/17 221 lb (100.2 kg)  12/09/16 227 lb (103 kg)      Studies/Labs Reviewed:   EKG:  EKG  Atrial fibrillation with controlled ventricular response. Left axis deviation. When compared to prior tracings, no change has occurred.  Recent Labs: No results found for requested labs within last 8760 hours.   Lipid Panel No results found for: CHOL, TRIG, HDL, CHOLHDL, VLDL, LDLCALC, LDLDIRECT  Additional studies/ records that were reviewed today include:  None    ASSESSMENT:    1. Paroxysmal atrial fibrillation (HCC)   2. Essential hypertension   3. Tachycardia-bradycardia syndrome ([place])   4. Syncope, unspecified syncope type   5. Chronic diastolic CHF (congestive heart failure) ([place])   6. CKD (chronic kidney disease) stage 3, GFR 30-59 ml/min: Probable   7. Chronic anticoagulation      PLAN:  In order of problems listed above:  1. Controlled rate. 2. Excellent blood pressure control. 3. Asymptomatic. No fainting or clinical requirement for pacemaker at this time. 4. Has not recurred. 5. No evidence of significant volume overload. 6. Not addressed. Most recent blood work revealed creatinine of 1.48. 7. No bleeding complications on Coumadin therapy.  Overall stable. Clinical follow-up in one year. Call of syncope, chest pain, or dyspnea. Monitor for evidence of bleeding on Coumadin.  Medication Adjustments/Labs and Tests Ordered: Current medicines are reviewed at length with the patient today.  Concerns regarding medicines are outlined above.  Medication changes, Labs and Tests ordered today are listed in the Patient Instructions below. There are no Patient Instructions on file for this visit.   Signed, Sinclair Grooms, MD  09/29/2017 2:50 PM    [facility] [place], [place], [place]  [address] Phone: [phone]; Fax: [phone]

## 2017-09-29 NOTE — Patient Instructions (Signed)

## 2017-10-08 DIAGNOSIS — R001 Bradycardia, unspecified: Secondary | ICD-10-CM | POA: Diagnosis not present

## 2017-10-08 DIAGNOSIS — I5030 Unspecified diastolic (congestive) heart failure: Secondary | ICD-10-CM | POA: Diagnosis not present

## 2017-10-08 DIAGNOSIS — R351 Nocturia: Secondary | ICD-10-CM | POA: Diagnosis not present

## 2017-10-08 DIAGNOSIS — Z6834 Body mass index (BMI) 34.0-34.9, adult: Secondary | ICD-10-CM | POA: Diagnosis not present

## 2017-10-08 DIAGNOSIS — N390 Urinary tract infection, site not specified: Secondary | ICD-10-CM | POA: Diagnosis not present

## 2017-10-08 DIAGNOSIS — M7582 Other shoulder lesions, left shoulder: Secondary | ICD-10-CM | POA: Diagnosis not present

## 2017-10-08 DIAGNOSIS — Z23 Encounter for immunization: Secondary | ICD-10-CM | POA: Diagnosis not present

## 2017-10-08 DIAGNOSIS — R609 Edema, unspecified: Secondary | ICD-10-CM | POA: Diagnosis not present

## 2017-10-08 DIAGNOSIS — F322 Major depressive disorder, single episode, severe without psychotic features: Secondary | ICD-10-CM | POA: Diagnosis not present

## 2017-10-08 DIAGNOSIS — G629 Polyneuropathy, unspecified: Secondary | ICD-10-CM | POA: Diagnosis not present

## 2017-10-13 ENCOUNTER — Ambulatory Visit (INDEPENDENT_AMBULATORY_CARE_PROVIDER_SITE_OTHER): Payer: Medicare Other

## 2017-10-13 DIAGNOSIS — Z5181 Encounter for therapeutic drug level monitoring: Secondary | ICD-10-CM | POA: Diagnosis not present

## 2017-10-13 DIAGNOSIS — I480 Paroxysmal atrial fibrillation: Secondary | ICD-10-CM | POA: Diagnosis not present

## 2017-10-13 LAB — POCT INR: INR: 1.5

## 2017-10-21 ENCOUNTER — Ambulatory Visit: Payer: Medicare Other | Admitting: Podiatry

## 2017-10-27 ENCOUNTER — Other Ambulatory Visit: Payer: Self-pay | Admitting: Interventional Cardiology

## 2017-10-27 ENCOUNTER — Ambulatory Visit (INDEPENDENT_AMBULATORY_CARE_PROVIDER_SITE_OTHER): Payer: Medicare Other | Admitting: *Deleted

## 2017-10-27 DIAGNOSIS — I480 Paroxysmal atrial fibrillation: Secondary | ICD-10-CM

## 2017-10-27 DIAGNOSIS — Z5181 Encounter for therapeutic drug level monitoring: Secondary | ICD-10-CM | POA: Diagnosis not present

## 2017-10-27 LAB — POCT INR: INR: 1.5

## 2017-10-28 ENCOUNTER — Ambulatory Visit (INDEPENDENT_AMBULATORY_CARE_PROVIDER_SITE_OTHER): Payer: Medicare Other | Admitting: Podiatry

## 2017-10-28 ENCOUNTER — Encounter: Payer: Self-pay | Admitting: Podiatry

## 2017-10-28 DIAGNOSIS — B351 Tinea unguium: Secondary | ICD-10-CM | POA: Diagnosis not present

## 2017-10-28 DIAGNOSIS — E1142 Type 2 diabetes mellitus with diabetic polyneuropathy: Secondary | ICD-10-CM

## 2017-10-28 DIAGNOSIS — M79676 Pain in unspecified toe(s): Secondary | ICD-10-CM | POA: Diagnosis not present

## 2017-10-28 NOTE — Progress Notes (Signed)
He presents today chief complaint of painful elongated toenails 1 through 5 bilateral.  Pulses remain palpable no open lesions or wounds. Toenails are thick yellow dystrophic onychomycotic and painful on palpation.  Assessment: Pain in limb secondary to onychomycosis.  Plan: Debridement of toenails 1 through 5 bilateral.

## 2017-11-10 ENCOUNTER — Ambulatory Visit (INDEPENDENT_AMBULATORY_CARE_PROVIDER_SITE_OTHER): Payer: Medicare Other | Admitting: *Deleted

## 2017-11-10 DIAGNOSIS — I480 Paroxysmal atrial fibrillation: Secondary | ICD-10-CM | POA: Diagnosis not present

## 2017-11-10 DIAGNOSIS — Z5181 Encounter for therapeutic drug level monitoring: Secondary | ICD-10-CM | POA: Diagnosis not present

## 2017-11-10 LAB — POCT INR: INR: 2.2

## 2017-11-24 ENCOUNTER — Ambulatory Visit (INDEPENDENT_AMBULATORY_CARE_PROVIDER_SITE_OTHER): Payer: Medicare Other | Admitting: Pharmacist

## 2017-11-24 DIAGNOSIS — Z5181 Encounter for therapeutic drug level monitoring: Secondary | ICD-10-CM

## 2017-11-24 DIAGNOSIS — I480 Paroxysmal atrial fibrillation: Secondary | ICD-10-CM

## 2017-11-24 LAB — POCT INR: INR: 1.3

## 2017-11-24 NOTE — Patient Instructions (Signed)
Take 2 tablets today and tomorrow, then start taking 1.5 tablets every day, except 2 tablets on Saturdays. Recheck INR in 1 week. Call # [phone] if placed on any new medications.

## 2017-11-25 ENCOUNTER — Ambulatory Visit
Admission: RE | Admit: 2017-11-25 | Discharge: 2017-11-25 | Disposition: A | Discharge status: Home / Self Care | Payer: Medicare Other | Source: Ambulatory Visit | Attending: Internal Medicine | Admitting: Internal Medicine

## 2017-11-25 ENCOUNTER — Other Ambulatory Visit: Payer: Self-pay | Admitting: Internal Medicine

## 2017-11-25 DIAGNOSIS — M545 Low back pain, unspecified: Secondary | ICD-10-CM

## 2017-11-25 DIAGNOSIS — M5136 Other intervertebral disc degeneration, lumbar region: Secondary | ICD-10-CM | POA: Diagnosis not present

## 2017-12-01 ENCOUNTER — Ambulatory Visit (INDEPENDENT_AMBULATORY_CARE_PROVIDER_SITE_OTHER): Payer: Medicare Other | Admitting: *Deleted

## 2017-12-01 DIAGNOSIS — Z5181 Encounter for therapeutic drug level monitoring: Secondary | ICD-10-CM | POA: Diagnosis not present

## 2017-12-01 DIAGNOSIS — I480 Paroxysmal atrial fibrillation: Secondary | ICD-10-CM

## 2017-12-01 DIAGNOSIS — R1032 Left lower quadrant pain: Secondary | ICD-10-CM | POA: Diagnosis not present

## 2017-12-01 DIAGNOSIS — M545 Low back pain: Secondary | ICD-10-CM | POA: Diagnosis not present

## 2017-12-01 LAB — POCT INR: INR: 2.7

## 2017-12-01 NOTE — Patient Instructions (Signed)
Continue taking 1.5 tablets every day. Recheck INR in 2 weeks.  Call # [phone] if placed on any new medications.

## 2017-12-04 DIAGNOSIS — R1032 Left lower quadrant pain: Secondary | ICD-10-CM | POA: Diagnosis not present

## 2017-12-04 DIAGNOSIS — M545 Low back pain: Secondary | ICD-10-CM | POA: Diagnosis not present

## 2017-12-15 ENCOUNTER — Ambulatory Visit (INDEPENDENT_AMBULATORY_CARE_PROVIDER_SITE_OTHER): Payer: Medicare Other | Admitting: *Deleted

## 2017-12-15 ENCOUNTER — Encounter: Payer: Self-pay | Admitting: Neurology

## 2017-12-15 ENCOUNTER — Ambulatory Visit (INDEPENDENT_AMBULATORY_CARE_PROVIDER_SITE_OTHER): Payer: Medicare Other | Admitting: Neurology

## 2017-12-15 VITALS — BP 147/57 | HR 57 | Ht 69.5 in | Wt 214.5 lb

## 2017-12-15 DIAGNOSIS — M545 Low back pain, unspecified: Secondary | ICD-10-CM

## 2017-12-15 DIAGNOSIS — G250 Essential tremor: Secondary | ICD-10-CM | POA: Diagnosis not present

## 2017-12-15 DIAGNOSIS — G8929 Other chronic pain: Secondary | ICD-10-CM | POA: Diagnosis not present

## 2017-12-15 DIAGNOSIS — I480 Paroxysmal atrial fibrillation: Secondary | ICD-10-CM | POA: Diagnosis not present

## 2017-12-15 DIAGNOSIS — Z5181 Encounter for therapeutic drug level monitoring: Secondary | ICD-10-CM | POA: Diagnosis not present

## 2017-12-15 HISTORY — DX: Low back pain, unspecified: M54.50

## 2017-12-15 HISTORY — DX: Other chronic pain: G89.29

## 2017-12-15 LAB — POCT INR: INR: 2.4

## 2017-12-15 MED ORDER — PREGABALIN 25 MG PO CAPS
50.0000 mg | ORAL_CAPSULE | Freq: Every day | ORAL | 3 refills | Status: DC
Start: 2017-12-15 — End: 2018-06-16

## 2017-12-15 NOTE — Patient Instructions (Signed)
Description   Continue taking 1.5 tablets every day. Recheck INR in 3 weeks.  Call # [phone] if placed on any new medications.

## 2017-12-15 NOTE — Patient Instructions (Signed)
We will add low dose lyrica at night. Start at 25 mg at night for 2 weeks, then take 50 mg at night.  Look out for leg swelling.  Lyrica (pregabalin) may cause drowsiness, gait instability, ankle swelling, or cognitive slowing as well as possible dizziness. If any significant side effects occur associated with this medication, please contact our office.

## 2017-12-15 NOTE — Progress Notes (Signed)
Reason for visit: Essential tremor, low back pain  Douglas Walker is an 83 y.o. male  History of present illness:  Douglas Walker is a 83 year old right-handed white male with a history of an essential tremor, the patient is on Mysoline and Topamax without full control of the tremor.  The patient has come in with a new problem today, he began having low back pain about 1 month ago.  He has had back pain in the past, epidural steroid injections have helped him significantly but the pain that started again spontaneously in November.  The patient indicates that he feels well during the day, but when he first gets up in the morning or when he gets up to go the bathroom at night he has significant back pain with electric shock pains across the back without radiation down either leg.  He reports no weakness in the legs.  He has not had any falls, he walks with a cane.  Once he gets up and moves around, the back pain improves.  X-ray of the low back showed degenerative changes, but no fractures.  The patient has been given a trial on prednisone without much benefit.  He has seen a chiropractor in the past for his back.  He is on Coumadin.  Past Medical History:  Diagnosis Date  . Chronic low back pain 12/15/2017  . Coronary artery disease   . Essential tremor 02/28/2016  . GERD (gastroesophageal reflux disease)   . Hernia   . Hyperlipemia   . Hypertension   . Macular degeneration   . Paroxysmal atrial fibrillation ([place]) 10/28/2013  . Tachycardia-bradycardia syndrome ([place]) 09/13/2014  . TIA (transient ischemic attack)     Past Surgical History:  Procedure Laterality Date  . APPENDECTOMY    . HERNIA REPAIR    . HERNIA REPAIR      Family History  Problem Relation Age of Onset  . GI problems Mother   . Other Sister        PAIN ISSUES  . Hearing loss Sister   . Blindness Sister   . Heart attack Neg Hx   . Stroke Neg Hx     Social history:  reports that he quit smoking about 54 years  ago. he has never used smokeless tobacco. He reports that he does not drink alcohol or use drugs.    Allergies  Allergen Reactions  . Sulfa Antibiotics Other (See Comments)    Weakness, dizziness    Medications:  Prior to Admission medications   Medication Sig Start Date End Date Taking? Authorizing Provider  acetaminophen (TYLENOL) 500 MG tablet Take 1,000 mg by mouth every 6 (six) hours as needed for mild pain.    [provider]  beta carotene w/minerals (OCUVITE) tablet Take 2 tablets by mouth daily.     [provider]  DULoxetine (CYMBALTA) 20 MG capsule Take 20 mg by mouth 2 (two) times daily.    [provider]  furosemide (LASIX) 40 MG tablet  11/15/16   [provider]  losartan (COZAAR) 50 MG tablet Take 50 mg by mouth daily. 08/18/17   [provider]  meclizine (ANTIVERT) 25 MG tablet Take 25 mg by mouth 2 (two) times daily as needed for dizziness.    [provider]  primidone (MYSOLINE) 250 MG tablet Take 250 mg by mouth 2 (two) times daily.  08/18/15   [provider]  Tamsulosin HCl (FLOMAX) 0.4 MG CAPS Take 0.4 mg by mouth daily  after breakfast.     [provider]  tiZANidine (ZANAFLEX) 4 MG tablet Take 4 mg by mouth at bedtime as needed (for back pain).    [provider]  topiramate (TOPAMAX) 50 MG tablet Take 1 tablet (50 mg total) by mouth 2 (two) times daily. 06/10/17   Kathrynn Ducking, MD  traMADol (ULTRAM) 50 MG tablet Take 50 mg by mouth every 6 (six) hours as needed for moderate pain.    [provider]  warfarin (COUMADIN) 5 MG tablet TAKE AS DIRECTED BY COUMADIN CLINIC 10/28/17   Belva Crome, MD    ROS:  Out of a complete 14 system review of symptoms, the patient complains only of the following symptoms, and all other reviewed systems are negative.  Hearing loss Loss of vision Frequency of urination Joint pain, back pain, walking difficulty Numbness,  tremors Depression  Blood pressure (!) 147/57, pulse (!) 57, height 5' 9.5" (1.765 m), weight 214 lb 8 oz (97.3 kg).  Physical Exam  General: The patient is alert and cooperative at the time of the examination. Patient is moderately obese.  Skin: 1-2+ edema below the knees is seen bilaterally..   Neurologic Exam  Mental status: The patient is alert and oriented x 3 at the time of the examination. The patient has apparent normal recent and remote memory, with an apparently normal attention span and concentration ability.   Cranial nerves: Facial symmetry is present. Speech is normal, no aphasia or dysarthria is noted. Extraocular movements are full. Visual fields are full.  A prominent jaw tremor seen.  Motor: The patient has good strength in all 4 extremities.  Some tremors noted with finger-nose-finger bilaterally, worse on the left.  Sensory examination: Soft touch sensation is symmetric on the face, arms, and legs.  Coordination: The patient has good finger-nose-finger and heel-to-shin bilaterally.  Gait and station: The patient has a slightly wide-based gait, he does use a cane for ambulation.  Tandem gait was not attempted.  Romberg is negative. No drift is seen.  Reflexes: Deep tendon reflexes are symmetric.   Assessment/Plan:  1.  Essential tremor  2.  Low back pain  We will give a trial on low-dose Lyrica starting at 25 mg at night for 1 week and then go to 50 mg at night.  In the past, the patient has had severe ankle swelling on gabapentin necessitating discontinuance of this medication.  If he does develop swelling on the Lyrica, he will need to stop the drug.  He will continue the Mysoline and Topamax, he will follow-up in 6 months.  Jill Alexanders MD 12/15/2017 10:42 AM  [facility] [address] [place], [place] [address]  Phone [phone] Fax [phone]

## 2017-12-15 NOTE — Progress Notes (Signed)
Faxed printed/signed pregabalin to [facility] at [phone]. Received fax confirmation.

## 2017-12-24 ENCOUNTER — Other Ambulatory Visit: Payer: Self-pay | Admitting: Internal Medicine

## 2017-12-24 DIAGNOSIS — M5416 Radiculopathy, lumbar region: Secondary | ICD-10-CM

## 2017-12-24 DIAGNOSIS — M545 Low back pain: Secondary | ICD-10-CM | POA: Diagnosis not present

## 2017-12-24 DIAGNOSIS — R1032 Left lower quadrant pain: Secondary | ICD-10-CM | POA: Diagnosis not present

## 2017-12-29 DIAGNOSIS — Z85828 Personal history of other malignant neoplasm of skin: Secondary | ICD-10-CM | POA: Diagnosis not present

## 2017-12-29 DIAGNOSIS — L821 Other seborrheic keratosis: Secondary | ICD-10-CM | POA: Diagnosis not present

## 2017-12-29 DIAGNOSIS — L570 Actinic keratosis: Secondary | ICD-10-CM | POA: Diagnosis not present

## 2017-12-29 DIAGNOSIS — C44622 Squamous cell carcinoma of skin of right upper limb, including shoulder: Secondary | ICD-10-CM | POA: Diagnosis not present

## 2017-12-29 DIAGNOSIS — D485 Neoplasm of uncertain behavior of skin: Secondary | ICD-10-CM | POA: Diagnosis not present

## 2018-01-03 ENCOUNTER — Ambulatory Visit
Admission: RE | Admit: 2018-01-03 | Discharge: 2018-01-03 | Disposition: A | Discharge status: Home / Self Care | Payer: Medicare Other | Source: Ambulatory Visit | Attending: Internal Medicine | Admitting: Internal Medicine

## 2018-01-03 DIAGNOSIS — M48061 Spinal stenosis, lumbar region without neurogenic claudication: Secondary | ICD-10-CM | POA: Diagnosis not present

## 2018-01-03 DIAGNOSIS — M5416 Radiculopathy, lumbar region: Secondary | ICD-10-CM

## 2018-01-05 ENCOUNTER — Ambulatory Visit (INDEPENDENT_AMBULATORY_CARE_PROVIDER_SITE_OTHER): Payer: Medicare Other | Admitting: *Deleted

## 2018-01-05 DIAGNOSIS — I480 Paroxysmal atrial fibrillation: Secondary | ICD-10-CM

## 2018-01-05 DIAGNOSIS — Z5181 Encounter for therapeutic drug level monitoring: Secondary | ICD-10-CM

## 2018-01-05 LAB — POCT INR: INR: 3.2

## 2018-01-05 NOTE — Patient Instructions (Signed)
Description   Skip today's dose, then continue taking 1.5 tablets every day. Recheck INR in 3 weeks.  Call # [phone] if placed on any new medications.

## 2018-01-26 ENCOUNTER — Encounter (INDEPENDENT_AMBULATORY_CARE_PROVIDER_SITE_OTHER): Payer: Self-pay

## 2018-01-26 ENCOUNTER — Ambulatory Visit (INDEPENDENT_AMBULATORY_CARE_PROVIDER_SITE_OTHER): Payer: Medicare Other | Admitting: *Deleted

## 2018-01-26 DIAGNOSIS — I480 Paroxysmal atrial fibrillation: Secondary | ICD-10-CM

## 2018-01-26 DIAGNOSIS — Z5181 Encounter for therapeutic drug level monitoring: Secondary | ICD-10-CM

## 2018-01-26 LAB — POCT INR: INR: 3

## 2018-01-26 NOTE — Patient Instructions (Signed)
Description   Continue taking 1.5 tablets every day. Recheck INR in 4 weeks.  Call # [phone] if placed on any new medications.

## 2018-01-29 ENCOUNTER — Ambulatory Visit: Payer: Medicare Other | Admitting: Podiatry

## 2018-02-03 ENCOUNTER — Ambulatory Visit (INDEPENDENT_AMBULATORY_CARE_PROVIDER_SITE_OTHER): Payer: Medicare Other | Admitting: Podiatry

## 2018-02-03 ENCOUNTER — Encounter: Payer: Self-pay | Admitting: Podiatry

## 2018-02-03 DIAGNOSIS — B351 Tinea unguium: Secondary | ICD-10-CM | POA: Diagnosis not present

## 2018-02-03 DIAGNOSIS — M545 Low back pain: Secondary | ICD-10-CM | POA: Diagnosis not present

## 2018-02-03 DIAGNOSIS — M47816 Spondylosis without myelopathy or radiculopathy, lumbar region: Secondary | ICD-10-CM | POA: Diagnosis not present

## 2018-02-03 DIAGNOSIS — M546 Pain in thoracic spine: Secondary | ICD-10-CM | POA: Diagnosis not present

## 2018-02-03 DIAGNOSIS — M48061 Spinal stenosis, lumbar region without neurogenic claudication: Secondary | ICD-10-CM | POA: Diagnosis not present

## 2018-02-03 DIAGNOSIS — E1142 Type 2 diabetes mellitus with diabetic polyneuropathy: Secondary | ICD-10-CM

## 2018-02-03 DIAGNOSIS — M5126 Other intervertebral disc displacement, lumbar region: Secondary | ICD-10-CM | POA: Diagnosis not present

## 2018-02-03 DIAGNOSIS — M5136 Other intervertebral disc degeneration, lumbar region: Secondary | ICD-10-CM | POA: Diagnosis not present

## 2018-02-03 DIAGNOSIS — M79676 Pain in unspecified toe(s): Secondary | ICD-10-CM

## 2018-02-03 DIAGNOSIS — M549 Dorsalgia, unspecified: Secondary | ICD-10-CM | POA: Diagnosis not present

## 2018-02-03 NOTE — Progress Notes (Signed)
He presents today with his son for chief complaint of painful elongated toenails bilateral.  Objective: Vital signs are stable he is alert and oriented x3.  Pulses are palpable.  Cerumen is intact muscle strength is normal symmetrical.  Walks with an antalgic gait.  Cane left hand.  Toenails are thick yellow dystrophic-like mycotic painful palpation sharply incurvated nail margins are noted.  Assessment: Pain in limb secondary onychomycosis incurvated nail margins.  Plan: Toenails 1 through 5 bilateral cover service secondary to pain.

## 2018-02-05 DIAGNOSIS — N3020 Other chronic cystitis without hematuria: Secondary | ICD-10-CM | POA: Diagnosis not present

## 2018-02-05 DIAGNOSIS — N401 Enlarged prostate with lower urinary tract symptoms: Secondary | ICD-10-CM | POA: Diagnosis not present

## 2018-02-05 DIAGNOSIS — R3915 Urgency of urination: Secondary | ICD-10-CM | POA: Diagnosis not present

## 2018-02-05 DIAGNOSIS — R351 Nocturia: Secondary | ICD-10-CM | POA: Diagnosis not present

## 2018-02-06 DIAGNOSIS — J0110 Acute frontal sinusitis, unspecified: Secondary | ICD-10-CM | POA: Diagnosis not present

## 2018-02-23 ENCOUNTER — Ambulatory Visit (INDEPENDENT_AMBULATORY_CARE_PROVIDER_SITE_OTHER): Payer: Medicare Other | Admitting: *Deleted

## 2018-02-23 DIAGNOSIS — I480 Paroxysmal atrial fibrillation: Secondary | ICD-10-CM | POA: Diagnosis not present

## 2018-02-23 DIAGNOSIS — Z5181 Encounter for therapeutic drug level monitoring: Secondary | ICD-10-CM

## 2018-02-23 LAB — POCT INR: INR: 3.5

## 2018-02-23 NOTE — Patient Instructions (Signed)
Description   Do not take any Coumadin today then start taking 1.5 tablets every day except 1 tablet on Mondays. Recheck INR in 3 weeks.  Call # [phone] if placed on any new medications.

## 2018-03-16 ENCOUNTER — Ambulatory Visit (INDEPENDENT_AMBULATORY_CARE_PROVIDER_SITE_OTHER): Payer: Medicare Other | Admitting: *Deleted

## 2018-03-16 DIAGNOSIS — Z5181 Encounter for therapeutic drug level monitoring: Secondary | ICD-10-CM | POA: Diagnosis not present

## 2018-03-16 DIAGNOSIS — I480 Paroxysmal atrial fibrillation: Secondary | ICD-10-CM | POA: Diagnosis not present

## 2018-03-16 LAB — POCT INR: INR: 2.5

## 2018-03-16 NOTE — Patient Instructions (Signed)
Description   Continue taking 1.5 tablets every day except 1 tablet on Mondays. Recheck INR in 4 weeks.  Call # [phone] if placed on any new medications.

## 2018-04-13 DIAGNOSIS — R251 Tremor, unspecified: Secondary | ICD-10-CM | POA: Diagnosis not present

## 2018-04-13 DIAGNOSIS — I5030 Unspecified diastolic (congestive) heart failure: Secondary | ICD-10-CM | POA: Diagnosis not present

## 2018-04-13 DIAGNOSIS — H353222 Exudative age-related macular degeneration, left eye, with inactive choroidal neovascularization: Secondary | ICD-10-CM | POA: Diagnosis not present

## 2018-04-13 DIAGNOSIS — I672 Cerebral atherosclerosis: Secondary | ICD-10-CM | POA: Diagnosis not present

## 2018-04-13 DIAGNOSIS — M545 Low back pain: Secondary | ICD-10-CM | POA: Diagnosis not present

## 2018-04-13 DIAGNOSIS — M5416 Radiculopathy, lumbar region: Secondary | ICD-10-CM | POA: Diagnosis not present

## 2018-04-13 DIAGNOSIS — H353133 Nonexudative age-related macular degeneration, bilateral, advanced atrophic without subfoveal involvement: Secondary | ICD-10-CM | POA: Diagnosis not present

## 2018-04-13 DIAGNOSIS — N400 Enlarged prostate without lower urinary tract symptoms: Secondary | ICD-10-CM | POA: Diagnosis not present

## 2018-04-13 DIAGNOSIS — F322 Major depressive disorder, single episode, severe without psychotic features: Secondary | ICD-10-CM | POA: Diagnosis not present

## 2018-04-13 DIAGNOSIS — H3561 Retinal hemorrhage, right eye: Secondary | ICD-10-CM | POA: Diagnosis not present

## 2018-04-13 DIAGNOSIS — H353212 Exudative age-related macular degeneration, right eye, with inactive choroidal neovascularization: Secondary | ICD-10-CM | POA: Diagnosis not present

## 2018-04-13 DIAGNOSIS — I10 Essential (primary) hypertension: Secondary | ICD-10-CM | POA: Diagnosis not present

## 2018-04-13 DIAGNOSIS — G629 Polyneuropathy, unspecified: Secondary | ICD-10-CM | POA: Diagnosis not present

## 2018-04-13 DIAGNOSIS — M7580 Other shoulder lesions, unspecified shoulder: Secondary | ICD-10-CM | POA: Diagnosis not present

## 2018-04-14 ENCOUNTER — Ambulatory Visit (INDEPENDENT_AMBULATORY_CARE_PROVIDER_SITE_OTHER): Payer: Medicare Other

## 2018-04-14 DIAGNOSIS — I480 Paroxysmal atrial fibrillation: Secondary | ICD-10-CM | POA: Diagnosis not present

## 2018-04-14 DIAGNOSIS — Z5181 Encounter for therapeutic drug level monitoring: Secondary | ICD-10-CM | POA: Diagnosis not present

## 2018-04-14 LAB — POCT INR: INR: 3

## 2018-04-14 NOTE — Patient Instructions (Signed)
Description   Continue taking 1.5 tablets every day except 1 tablet on Mondays. Recheck INR in 4 weeks.  Call # [phone] if placed on any new medications.

## 2018-05-05 ENCOUNTER — Ambulatory Visit (INDEPENDENT_AMBULATORY_CARE_PROVIDER_SITE_OTHER): Payer: Medicare Other | Admitting: Podiatry

## 2018-05-05 ENCOUNTER — Other Ambulatory Visit: Payer: Self-pay

## 2018-05-05 ENCOUNTER — Encounter: Payer: Self-pay | Admitting: Podiatry

## 2018-05-05 DIAGNOSIS — M79676 Pain in unspecified toe(s): Secondary | ICD-10-CM

## 2018-05-05 DIAGNOSIS — B351 Tinea unguium: Secondary | ICD-10-CM | POA: Diagnosis not present

## 2018-05-05 DIAGNOSIS — E1142 Type 2 diabetes mellitus with diabetic polyneuropathy: Secondary | ICD-10-CM

## 2018-05-05 NOTE — Progress Notes (Signed)
Presents today chief complaint of painful elongated toenails.  Objective pulses remain palpable toenails are long thick yellow dystrophic-like mycotic no open lesions or wounds.  Assessment: Pain in limb secondary to onychomycosis.  Plan: Debridement of toenails 1 through 5 bilateral.  Recommended he purchase a size larger shoe to wear.  Currently the ones here to wearing just a little bit too small this could be causing his foot pain.

## 2018-05-13 ENCOUNTER — Ambulatory Visit (INDEPENDENT_AMBULATORY_CARE_PROVIDER_SITE_OTHER): Payer: Medicare Other | Admitting: Pharmacist

## 2018-05-13 DIAGNOSIS — Z5181 Encounter for therapeutic drug level monitoring: Secondary | ICD-10-CM

## 2018-05-13 DIAGNOSIS — I480 Paroxysmal atrial fibrillation: Secondary | ICD-10-CM | POA: Diagnosis not present

## 2018-05-13 LAB — POCT INR: INR: 2.4

## 2018-05-13 NOTE — Patient Instructions (Signed)
Description   Continue taking 1.5 tablets every day except 1 tablet on Mondays. Recheck INR in 6 weeks.  Call # [phone] if placed on any new medications.      

## 2018-05-21 ENCOUNTER — Other Ambulatory Visit: Payer: Self-pay | Admitting: Interventional Cardiology

## 2018-06-16 ENCOUNTER — Encounter: Payer: Self-pay | Admitting: Neurology

## 2018-06-16 ENCOUNTER — Ambulatory Visit (INDEPENDENT_AMBULATORY_CARE_PROVIDER_SITE_OTHER): Payer: Medicare Other | Admitting: Neurology

## 2018-06-16 VITALS — BP 100/60 | Ht 69.5 in | Wt 219.5 lb

## 2018-06-16 DIAGNOSIS — G8929 Other chronic pain: Secondary | ICD-10-CM | POA: Diagnosis not present

## 2018-06-16 DIAGNOSIS — G609 Hereditary and idiopathic neuropathy, unspecified: Secondary | ICD-10-CM | POA: Diagnosis not present

## 2018-06-16 DIAGNOSIS — M545 Low back pain, unspecified: Secondary | ICD-10-CM

## 2018-06-16 DIAGNOSIS — G250 Essential tremor: Secondary | ICD-10-CM

## 2018-06-16 MED ORDER — PREGABALIN 25 MG PO CAPS: 25.0000 mg | ORAL_CAPSULE | Freq: Every day | ORAL | 3 refills | Status: DC

## 2018-06-16 NOTE — Progress Notes (Signed)
Reason for visit: Essential tremor, peripheral neuropathy  Douglas Walker is an 84 y.o. male  History of present illness:  Douglas Walker is a 84 year old right-handed white male with a history of chronic low back pain, a recent MRI of the lumbar spine showed evidence of significant spinal stenosis at the L3-4 level.  The patient has a peripheral neuropathy with tingling sensations below the knees bilaterally, he has to wiggle his feet constantly throughout the day.  The patient does have some mild gait instability, uses a cane for ambulation.  He has had one fall since last seen when he fell over when he tried to stand up from a seated position.  The patient takes Lyrica 25 mg at night which has offered him good improvement in his symptoms, he sleeps well at night on the medication.  He takes Mysoline and Topamax for the tremor.  The patient has some right shoulder discomfort as well.  The patient is on Cymbalta in low-dose taking 20 mg twice daily.  The swelling in the ankles goes away overnight, but comes back as soon as he gets up and moves around.  He returns to this office for an evaluation.  Past Medical History:  Diagnosis Date  . Chronic low back pain 12/15/2017  . Coronary artery disease   . Essential tremor 02/28/2016  . GERD (gastroesophageal reflux disease)   . Hernia   . Hyperlipemia   . Hypertension   . Macular degeneration   . Paroxysmal atrial fibrillation ([place]) 10/28/2013  . Tachycardia-bradycardia syndrome ([place]) 09/13/2014  . TIA (transient ischemic attack)     Past Surgical History:  Procedure Laterality Date  . APPENDECTOMY    . HERNIA REPAIR    . HERNIA REPAIR      Family History  Problem Relation Age of Onset  . GI problems Mother   . Other Sister        PAIN ISSUES  . Hearing loss Sister   . Blindness Sister   . Heart attack Neg Hx   . Stroke Neg Hx     Social history:  reports that he quit smoking about 55 years ago. He has never used smokeless  tobacco. He reports that he does not drink alcohol or use drugs.    Allergies  Allergen Reactions  . Sulfa Antibiotics Other (See Comments)    Weakness, dizziness    Medications:  Prior to Admission medications   Medication Sig Start Date End Date Taking? Authorizing Provider  acetaminophen (TYLENOL) 500 MG tablet Take 1,000 mg by mouth every 6 (six) hours as needed for mild pain.   Yes [provider]  beta carotene w/minerals (OCUVITE) tablet Take 2 tablets by mouth daily.    Yes [provider]  DULoxetine (CYMBALTA) 20 MG capsule Take 20 mg by mouth 2 (two) times daily.   Yes [provider]  furosemide (LASIX) 40 MG tablet  11/15/16  Yes [provider]  losartan (COZAAR) 50 MG tablet Take 50 mg by mouth daily. 08/18/17  Yes [provider]  meclizine (ANTIVERT) 25 MG tablet Take 25 mg by mouth 2 (two) times daily as needed for dizziness.   Yes [provider]  pregabalin (LYRICA) 25 MG capsule Take 2 capsules (50 mg total) by mouth at bedtime. 12/15/17  Yes Kathrynn Ducking, MD  primidone (MYSOLINE) 250 MG tablet Take 250 mg by mouth 2 (two) times daily.  08/18/15  Yes [provider]  Tamsulosin HCl (FLOMAX) 0.4  MG CAPS Take 0.4 mg by mouth daily after breakfast.    Yes [provider]  tiZANidine (ZANAFLEX) 4 MG tablet Take 4 mg by mouth at bedtime as needed (for back pain).   Yes [provider]  topiramate (TOPAMAX) 50 MG tablet Take 1 tablet (50 mg total) by mouth 2 (two) times daily. 06/10/17  Yes Kathrynn Ducking, MD  traMADol (ULTRAM) 50 MG tablet Take 50 mg by mouth every 6 (six) hours as needed for moderate pain.   Yes [provider]  warfarin (COUMADIN) 5 MG tablet TAKE AS DIRECTED BY COUMADIN CLINIC 05/21/18  Yes Belva Crome, MD    ROS:  Out of a complete 14 system review of symptoms, the patient complains only of the following symptoms, and all other reviewed systems are  negative.  Hearing loss, drooling Loss of vision Leg swelling Snoring Back pain, walking difficulty Tremors Depression  Blood pressure 100/60, height 5' 9.5" (1.765 m), weight 219 lb 8 oz (99.6 kg).  Physical Exam  General: The patient is alert and cooperative at the time of the examination.  The patient is moderately obese.  Skin: 3+ edema below the knees is seen bilaterally.   Neurologic Exam  Mental status: The patient is alert and oriented x 3 at the time of the examination. The patient has apparent normal recent and remote memory, with an apparently normal attention span and concentration ability.   Cranial nerves: Facial symmetry is present. Speech is normal, no aphasia or dysarthria is noted. Extraocular movements are full. Visual fields are full.  A prominent jaw tremor is seen.  Motor: The patient has good strength in all 4 extremities.  Sensory examination: Soft touch sensation is symmetric on the face, arms, and legs.  Coordination: The patient has good finger-nose-finger and heel-to-shin bilaterally.  No significant tremor seen with finger-nose-finger on either side.  Gait and station: The patient has a slightly wide-based gait, the patient uses a cane for ambulation.  Tandem gait was not attempted.  Romberg is negative.  Reflexes: Deep tendon reflexes are symmetric, but are depressed.   MRI lumbar 01/03/18:  IMPRESSION: 1. Progressive disc degeneration at multiple levels, most notably L3-4 where a new disc extrusion contributes to severe spinal stenosis. 2. Mild lateral recess and neural foraminal narrowing at L4-5. 3. Chronic 12 mm intradural nodule at L1 which may represent a lipoma or dermoid.  * MRI scan images were reviewed online. I agree with the written report.    Assessment/Plan:  1.  Peripheral neuropathy  2.  Gait disturbance  3.  Essential tremor  4.  Lumbar spinal stenosis  The patient does not wish to go up on the Cymbalta dose to  help with the neuropathy discomfort.  He is encouraged to keep his feet propped up during the day when he is sitting.  The patient gets some benefit from very low-dose Lyrica, he will remain on 25 mg at night.  The patient will continue the Topamax for the tremor.  He will follow-up in 6 months.  Jill Alexanders MD 06/16/2018 10:27 AM  [facility] [address] [place],  [address]  Phone [phone] Fax [phone]

## 2018-06-24 ENCOUNTER — Ambulatory Visit (INDEPENDENT_AMBULATORY_CARE_PROVIDER_SITE_OTHER): Payer: Medicare Other | Admitting: *Deleted

## 2018-06-24 DIAGNOSIS — Z5181 Encounter for therapeutic drug level monitoring: Secondary | ICD-10-CM | POA: Diagnosis not present

## 2018-06-24 DIAGNOSIS — I480 Paroxysmal atrial fibrillation: Secondary | ICD-10-CM | POA: Diagnosis not present

## 2018-06-24 LAB — POCT INR: INR: 2 (ref 2.0–3.0)

## 2018-06-24 NOTE — Patient Instructions (Addendum)
Description   Continue taking 1.5 tablets every day except 1 tablet on Mondays. Recheck INR in 5-6 weeks.  Call # [phone] if placed on any new medications.

## 2018-06-29 DIAGNOSIS — D485 Neoplasm of uncertain behavior of skin: Secondary | ICD-10-CM | POA: Diagnosis not present

## 2018-06-29 DIAGNOSIS — L821 Other seborrheic keratosis: Secondary | ICD-10-CM | POA: Diagnosis not present

## 2018-06-29 DIAGNOSIS — L570 Actinic keratosis: Secondary | ICD-10-CM | POA: Diagnosis not present

## 2018-06-29 DIAGNOSIS — D692 Other nonthrombocytopenic purpura: Secondary | ICD-10-CM | POA: Diagnosis not present

## 2018-06-29 DIAGNOSIS — Z85828 Personal history of other malignant neoplasm of skin: Secondary | ICD-10-CM | POA: Diagnosis not present

## 2018-07-25 ENCOUNTER — Other Ambulatory Visit: Payer: Self-pay | Admitting: Neurology

## 2018-07-29 ENCOUNTER — Ambulatory Visit (INDEPENDENT_AMBULATORY_CARE_PROVIDER_SITE_OTHER): Payer: Medicare Other | Admitting: *Deleted

## 2018-07-29 DIAGNOSIS — Z5181 Encounter for therapeutic drug level monitoring: Secondary | ICD-10-CM | POA: Diagnosis not present

## 2018-07-29 DIAGNOSIS — I480 Paroxysmal atrial fibrillation: Secondary | ICD-10-CM | POA: Diagnosis not present

## 2018-07-29 LAB — POCT INR: INR: 2.7 (ref 2.0–3.0)

## 2018-07-29 NOTE — Patient Instructions (Signed)
Description   Continue taking 1.5 tablets every day except 1 tablet on Mondays. Recheck INR in 6 weeks.  Call # [phone] if placed on any new medications.

## 2018-08-13 ENCOUNTER — Encounter: Payer: Self-pay | Admitting: Podiatry

## 2018-08-13 ENCOUNTER — Ambulatory Visit (INDEPENDENT_AMBULATORY_CARE_PROVIDER_SITE_OTHER): Payer: Medicare Other | Admitting: Podiatry

## 2018-08-13 DIAGNOSIS — M79676 Pain in unspecified toe(s): Secondary | ICD-10-CM | POA: Diagnosis not present

## 2018-08-13 DIAGNOSIS — E1142 Type 2 diabetes mellitus with diabetic polyneuropathy: Secondary | ICD-10-CM

## 2018-08-13 DIAGNOSIS — D689 Coagulation defect, unspecified: Secondary | ICD-10-CM

## 2018-08-13 DIAGNOSIS — B351 Tinea unguium: Secondary | ICD-10-CM

## 2018-08-13 NOTE — Progress Notes (Signed)
He presents today chief complaint of painful elongated toenails.  Objective: Toenails are long thick yellow dystrophic-like mycotic painful palpation.  No open lesions or wounds.  Assessment: Pain in limb secondary to onychomycosis.  Plan: Debridement of toenails 1 through 5 bilateral cover service secondary to pain.

## 2018-09-07 DIAGNOSIS — I10 Essential (primary) hypertension: Secondary | ICD-10-CM | POA: Diagnosis not present

## 2018-09-07 DIAGNOSIS — M5416 Radiculopathy, lumbar region: Secondary | ICD-10-CM | POA: Diagnosis not present

## 2018-09-07 DIAGNOSIS — Z23 Encounter for immunization: Secondary | ICD-10-CM | POA: Diagnosis not present

## 2018-09-07 DIAGNOSIS — R251 Tremor, unspecified: Secondary | ICD-10-CM | POA: Diagnosis not present

## 2018-09-07 DIAGNOSIS — G259 Extrapyramidal and movement disorder, unspecified: Secondary | ICD-10-CM | POA: Diagnosis not present

## 2018-09-07 DIAGNOSIS — N400 Enlarged prostate without lower urinary tract symptoms: Secondary | ICD-10-CM | POA: Diagnosis not present

## 2018-09-07 DIAGNOSIS — I480 Paroxysmal atrial fibrillation: Secondary | ICD-10-CM | POA: Diagnosis not present

## 2018-09-07 DIAGNOSIS — G629 Polyneuropathy, unspecified: Secondary | ICD-10-CM | POA: Diagnosis not present

## 2018-09-07 DIAGNOSIS — I672 Cerebral atherosclerosis: Secondary | ICD-10-CM | POA: Diagnosis not present

## 2018-09-07 DIAGNOSIS — Z0000 Encounter for general adult medical examination without abnormal findings: Secondary | ICD-10-CM | POA: Diagnosis not present

## 2018-09-07 DIAGNOSIS — M545 Low back pain: Secondary | ICD-10-CM | POA: Diagnosis not present

## 2018-09-07 DIAGNOSIS — I5030 Unspecified diastolic (congestive) heart failure: Secondary | ICD-10-CM | POA: Diagnosis not present

## 2018-09-09 ENCOUNTER — Ambulatory Visit (INDEPENDENT_AMBULATORY_CARE_PROVIDER_SITE_OTHER): Payer: Medicare Other | Admitting: *Deleted

## 2018-09-09 DIAGNOSIS — I480 Paroxysmal atrial fibrillation: Secondary | ICD-10-CM | POA: Diagnosis not present

## 2018-09-09 DIAGNOSIS — Z5181 Encounter for therapeutic drug level monitoring: Secondary | ICD-10-CM | POA: Diagnosis not present

## 2018-09-09 LAB — POCT INR: INR: 2.3 (ref 2.0–3.0)

## 2018-09-09 NOTE — Patient Instructions (Signed)
Description   Continue taking 1.5 tablets every day except 1 tablet on Mondays. Recheck INR in 6 weeks.  Call # [phone] if placed on any new medications.

## 2018-09-27 ENCOUNTER — Other Ambulatory Visit: Payer: Self-pay | Admitting: Interventional Cardiology

## 2018-09-28 MED ORDER — WARFARIN SODIUM 5 MG PO TABS: ORAL_TABLET | ORAL | 1 refills | Status: DC

## 2018-09-28 NOTE — Addendum Note (Signed)
Addended by: Derrel Nip B on: 09/28/2018 02:59 PM   Modules accepted: Orders

## 2018-10-20 NOTE — Progress Notes (Signed)
Cardiology Office Note:    Date:  10/21/2018   ID:  Douglas Walker, DOB 01-07-1925, MRN [id number]  PCP:  Josetta Huddle, MD  Cardiologist:  No primary care provider on file.   Referring MD: Josetta Huddle, MD   Chief Complaint  Patient presents with  . Atrial Fibrillation  . Bradycardia    History of Present Illness:    Douglas Walker is a 84 y.o. male with a hx of tachycardia-bradycardia syndrome per 30 day monitor, history of recurring syncopal episodes, history of paroxysmal atrial fibrillation, chronic anticoagulation therapy, prior history of TIA,  and coronary artery disease.   84 year old gentleman, still living independently at home.  Has had relatively frequent falls.  He is on anticoagulation therapy and has not had any hot head trauma.  He has had some extremity lacerations with prolonged bleeding.  He otherwise only complains of exertional dyspnea.  There is no orthopnea.  He has significant lower extremity edema which was treated initially with diuretic therapy by his primary, Dr.Neville Gates.  Edema has recurred despite continuing to take furosemide.   Past Medical History:  Diagnosis Date  . Chronic low back pain 12/15/2017  . Coronary artery disease   . Essential tremor 02/28/2016  . GERD (gastroesophageal reflux disease)   . Hernia   . Hyperlipemia   . Hypertension   . Macular degeneration   . Paroxysmal atrial fibrillation ([place]) 10/28/2013  . Tachycardia-bradycardia syndrome ([place]) 09/13/2014  . TIA (transient ischemic attack)     Past Surgical History:  Procedure Laterality Date  . APPENDECTOMY    . HERNIA REPAIR    . HERNIA REPAIR      Current Medications: Current Meds  Medication Sig  . acetaminophen (TYLENOL) 500 MG tablet Take 1,000 mg by mouth every 6 (six) hours as needed for mild pain.  . beta carotene w/minerals (OCUVITE) tablet Take 2 tablets by mouth daily.   . DULoxetine (CYMBALTA) 20 MG capsule Take 20 mg by mouth 2 (two) times  daily.  . furosemide (LASIX) 40 MG tablet Take one and a half (1 1/2 tablets (60 mg) by mouth daily.  Marland Kitchen losartan (COZAAR) 50 MG tablet Take 50 mg by mouth daily.  Marland Kitchen LYRICA 25 MG capsule TAKE 2 CAPSULES BY MOUTH AT BED TIME.  . meclizine (ANTIVERT) 25 MG tablet Take 25 mg by mouth 2 (two) times daily as needed for dizziness.  . primidone (MYSOLINE) 250 MG tablet Take 250 mg by mouth 2 (two) times daily.   . Tamsulosin HCl (FLOMAX) 0.4 MG CAPS Take 0.4 mg by mouth daily after breakfast.   . tiZANidine (ZANAFLEX) 4 MG tablet Take 4 mg by mouth at bedtime as needed (for back pain).  . topiramate (TOPAMAX) 50 MG tablet TAKE 1 TABLET BY MOUTH TWICE A DAY  . traMADol (ULTRAM) 50 MG tablet Take 50 mg by mouth every 6 (six) hours as needed for moderate pain.  Marland Kitchen warfarin (COUMADIN) 5 MG tablet TAKE AS DIRECTED BY COUMADIN CLINIC     Allergies:   Sulfa antibiotics   Social History   Socioeconomic History  . Marital status: Widowed    Spouse name: Not on file  . Number of children: 1  . Years of education: 55  . Highest education level: Not on file  Occupational History    Comment: retired  Scientific laboratory technician  . Financial resource strain: Not on file  . Food insecurity:    Worry: Not on file    Inability: Not  on file  . Transportation needs:    Medical: Not on file    Non-medical: Not on file  Tobacco Use  . Smoking status: Former Smoker    Last attempt to quit: 12/30/1962    Years since quitting: 55.8  . Smokeless tobacco: Never Used  Substance and Sexual Activity  . Alcohol use: No  . Drug use: No  . Sexual activity: Never  Lifestyle  . Physical activity:    Days per week: Not on file    Minutes per session: Not on file  . Stress: Not on file  Relationships  . Social connections:    Talks on phone: Not on file    Gets together: Not on file    Attends religious service: Not on file    Active member of club or organization: Not on file    Attends meetings of clubs or organizations:  Not on file    Relationship status: Not on file  Other Topics Concern  . Not on file  Social History Narrative   Lives alone   Right-handed   Caffeine use- caffeine free coffee, occas soda     Family History: The patient's family history includes Blindness in his sister; GI problems in his mother; Hearing loss in his sister; Other in his sister. There is no history of Heart attack or Stroke.  ROS:   Please see the history of present illness.    He is concerned about lower extremity swelling, hearing loss, depression, leg pain, difficulty with balance, and recurring vertigo.  All other systems reviewed and are negative.  EKGs/Labs/Other Studies Reviewed:    The following studies were reviewed today: Continuous cardiac monitor performed in June 2016: Study Highlights    The study is difficult to interpret because of wandering baseline and decreased P-wave amplitude  Bradycardia to normal sinus rhythm noted most frequently  Accelerated junctional rhythm is felt to be present at times.  Possible wandering atrial pacemaker rhythm  No excessive pauses or bradycardia less than 45 bpm  No definite instances of atrial fibrillation were identified.    Findings are compatible with sinus node dysfunction  No pauses greater than 3 seconds.  No tach arrhythmias.  No definite atrial fibrillation     TSH performed by Dr. Inda Merlin on 09/07/2018 was 7.8  EKG:  EKG is  ordered today.  The ekg ordered today demonstrates atrial fibrillation with severe bradycardia, left axis deviation, and when compared to prior significant reduction in heart rate is noted.  Recent Labs: No results found for requested labs within last 8760 hours.  Recent Lipid Panel No results found for: CHOL, TRIG, HDL, CHOLHDL, VLDL, LDLCALC, LDLDIRECT  Physical Exam:    VS:  BP 136/64   Pulse (!) 37   Ht 5\' 9"  (1.753 m)   Wt 215 lb (97.5 kg)   BMI 31.75 kg/m     Wt Readings from Last 3 Encounters:  10/21/18 215  lb (97.5 kg)  06/16/18 219 lb 8 oz (99.6 kg)  12/15/17 214 lb 8 oz (97.3 kg)     GEN: Bili.  Well nourished, well developed in no acute distress HEENT: Normal NECK: No JVD. LYMPHATICS: No lymphadenopathy CARDIAC: Irregular slow RR, no murmur, no gallop, bilateral 2-3+ ankle to mid shin edema. VASCULAR: 2+ bilateral bradycardic pulses.  No bruits. RESPIRATORY:  Clear to auscultation without rales, wheezing or rhonchi  ABDOMEN: Soft, non-tender, non-distended, No pulsatile mass, MUSCULOSKELETAL: No deformity  SKIN: Warm and dry NEUROLOGIC:  Alert and  oriented x 3 PSYCHIATRIC:  Normal affect   ASSESSMENT:    1. Paroxysmal atrial fibrillation (HCC)   2. Bradycardia   3. Essential hypertension   4. Tachycardia-bradycardia syndrome ([place])   5. Chronic diastolic CHF (congestive heart failure) ([place])   6. Chronic anticoagulation   7. Elevated TSH    PLAN:    In order of problems listed above:  1. Continuous atrial fibrillation with marked bradycardia.  Heart rate on last office visit a year ago was 52.  He is on no rate controlling medications. 2. Severe bradycardia with chronic atrial fibrillation.  Long-standing suspicion of tachybradycardia syndrome.  Also noted today that a TSH performed by his primary physician within the past 6 weeks was elevated.  We will recheck TSH today we will also start the patient on Levoxyl 25 mcg/day.  TSH in 4 to 6 weeks and office follow-up with me in 3 months.  Refer to Dr. Osie Cheeks to consider permanent pacemaker implantation. 3. Adequate blood pressure control. 4. Needs pacemaker therapy most likely but will defer to Dr. Tanna Furry judgment. 5. Lower extremity edema possibly related to acute on chronic diastolic heart failure in the setting of severe bradycardia.  Rule out component related to hypothyroidism. 6. Continue anticoagulation therapy.  INR today.  Also gave counseling concerning dental work with relationship to anticoagulation.  If INR is  less than 1.8 today, they should go to the dentist and attempt to have extraction.  Otherwise they should wait and we should plan cessation of anticoagulation.  He has been off Coumadin for several days because he had a fall and laceration.  Coumadin was stopped by his family. 7. Could have myxedema.  Bradycardia possibly being contributed to by hypothyroidism.  Multiple significant problems.  Counseling concerning management of thyroid and bradycardia are discussed.  Prior imaging and monitoring studies also reviewed.  Greater than 50% of the time during this office visit was spent in education, counseling, and coordination of care related to underlying disease process and testing as outlined.   Medication Adjustments/Labs and Tests Ordered: Current medicines are reviewed at length with the patient today.  Concerns regarding medicines are outlined above.  Orders Placed This Encounter  Procedures  . TSH  . EKG 12-Lead   Meds ordered this encounter  Medications  . levothyroxine (SYNTHROID, LEVOTHROID) 25 MCG tablet    Sig: Take 1 tablet (25 mcg total) by mouth daily before breakfast.    Dispense:  90 tablet    Refill:  1    Patient Instructions  Your physician has recommended you make the following change in your medication: START LEVOTHYROXINE 25 MCG  EVERY DAY   Your physician recommends that you return for lab work in: TODAY TSH AND 6 [place] physician recommends that you schedule a follow-up appointment in: [place], Sinclair Grooms, MD  10/21/2018 10:21 AM    [place]

## 2018-10-21 ENCOUNTER — Telehealth: Payer: Self-pay | Admitting: Cardiology

## 2018-10-21 ENCOUNTER — Telehealth: Payer: Self-pay | Admitting: *Deleted

## 2018-10-21 ENCOUNTER — Encounter: Payer: Self-pay | Admitting: Interventional Cardiology

## 2018-10-21 ENCOUNTER — Ambulatory Visit (INDEPENDENT_AMBULATORY_CARE_PROVIDER_SITE_OTHER): Payer: Medicare Other | Admitting: Interventional Cardiology

## 2018-10-21 ENCOUNTER — Ambulatory Visit (INDEPENDENT_AMBULATORY_CARE_PROVIDER_SITE_OTHER): Payer: Medicare Other | Admitting: *Deleted

## 2018-10-21 VITALS — BP 136/64 | HR 37 | Ht 69.0 in | Wt 215.0 lb

## 2018-10-21 DIAGNOSIS — Z5181 Encounter for therapeutic drug level monitoring: Secondary | ICD-10-CM

## 2018-10-21 DIAGNOSIS — Z7901 Long term (current) use of anticoagulants: Secondary | ICD-10-CM | POA: Diagnosis not present

## 2018-10-21 DIAGNOSIS — I5032 Chronic diastolic (congestive) heart failure: Secondary | ICD-10-CM

## 2018-10-21 DIAGNOSIS — I480 Paroxysmal atrial fibrillation: Secondary | ICD-10-CM | POA: Diagnosis not present

## 2018-10-21 DIAGNOSIS — R001 Bradycardia, unspecified: Secondary | ICD-10-CM

## 2018-10-21 DIAGNOSIS — I10 Essential (primary) hypertension: Secondary | ICD-10-CM | POA: Diagnosis not present

## 2018-10-21 DIAGNOSIS — I495 Sick sinus syndrome: Secondary | ICD-10-CM | POA: Diagnosis not present

## 2018-10-21 DIAGNOSIS — R7989 Other specified abnormal findings of blood chemistry: Secondary | ICD-10-CM | POA: Diagnosis not present

## 2018-10-21 LAB — POCT INR: INR: 2.8 (ref 2.0–3.0)

## 2018-10-21 LAB — TSH: TSH: 10.78 u[IU]/mL — AB (ref 0.450–4.500)

## 2018-10-21 MED ORDER — LEVOTHYROXINE SODIUM 25 MCG PO TABS: 25.0000 ug | ORAL_TABLET | Freq: Every day | ORAL | 1 refills | Status: DC

## 2018-10-21 NOTE — Telephone Encounter (Signed)
Pt takes warfarin for afib with CHADS2VASc score of 7 (age x2, HTN, CHF, CAD, TIA). Do not want patient holding warfarin for 2 days prior to single dental extraction due to elevated cardiac risk (typically do not hold at all for single dental extraction).  Will advise pt to hold warfarin only on Sunday and will recheck INR Monday. Pt is aware of plan.

## 2018-10-21 NOTE — Patient Instructions (Signed)
Description   Continue taking 1.5 tablets every day except 1 tablet on Mondays. Recheck INR in 6 weeks.  Call # [phone] if placed on any new medications.

## 2018-10-21 NOTE — Patient Instructions (Addendum)
Your physician has recommended you make the following change in your medication: START LEVOTHYROXINE 25 [place]   Your physician recommends that you return for lab work in: TODAY TSH AND 6 [place] physician recommends that you schedule a follow-up appointment in: [place]

## 2018-10-21 NOTE — Telephone Encounter (Signed)
Called from dentist office. Patient was in the chair for single tooth extraction and they wanted to make sure this was OK with Dr Tamala Julian. In reviewing the patients chart it appears Dr Tamala Julian actually saw the patient today and made specific recommendations about this- "if INR less than 1.8 go ahead with extraction". The patients INR is 2.8. I spoke with the dentist. Plan is to hold Coumadin Sat and Sun and check INR again Monday.   Kerin Ransom PA-C 10/21/2018 2:54 PM

## 2018-10-21 NOTE — Telephone Encounter (Signed)
Discussed with Dr Tamala Julian and if dentist is okay with extracting a tooth with INR 2.8 that is his decision and from a heart standpoint is clear.Dentist office aware of recommendations ./cy

## 2018-10-21 NOTE — Telephone Encounter (Signed)
Message from front desk. Pt's son is at front desk and states patient is in dental chair and he needs a tooth pulled and the dental office wants clearance for this. Adv front desk that that following the clearance/pre op schedule, the office will need to send a clearance request if they need clearance.  Pt on chronic anticoagulation.

## 2018-10-26 ENCOUNTER — Ambulatory Visit (INDEPENDENT_AMBULATORY_CARE_PROVIDER_SITE_OTHER): Payer: Medicare Other | Admitting: Pharmacist

## 2018-10-26 DIAGNOSIS — I480 Paroxysmal atrial fibrillation: Secondary | ICD-10-CM | POA: Diagnosis not present

## 2018-10-26 DIAGNOSIS — Z5181 Encounter for therapeutic drug level monitoring: Secondary | ICD-10-CM

## 2018-10-26 LAB — POCT INR: INR: 1.9 — AB (ref 2.0–3.0)

## 2018-10-26 NOTE — Patient Instructions (Signed)
Description   Continue taking 1.5 tablets every day except 1 tablet on Mondays. Recheck INR in 6 weeks.  Call # [phone] if placed on any new medications.

## 2018-11-11 ENCOUNTER — Encounter: Payer: Self-pay | Admitting: Podiatry

## 2018-11-11 ENCOUNTER — Ambulatory Visit (INDEPENDENT_AMBULATORY_CARE_PROVIDER_SITE_OTHER): Payer: Medicare Other | Admitting: Podiatry

## 2018-11-11 DIAGNOSIS — L608 Other nail disorders: Secondary | ICD-10-CM

## 2018-11-11 DIAGNOSIS — B351 Tinea unguium: Secondary | ICD-10-CM | POA: Diagnosis not present

## 2018-11-11 DIAGNOSIS — D689 Coagulation defect, unspecified: Secondary | ICD-10-CM

## 2018-11-11 DIAGNOSIS — E1142 Type 2 diabetes mellitus with diabetic polyneuropathy: Secondary | ICD-10-CM

## 2018-11-11 DIAGNOSIS — M79676 Pain in unspecified toe(s): Secondary | ICD-10-CM | POA: Diagnosis not present

## 2018-11-11 NOTE — Progress Notes (Signed)
Complaint:  Visit Type: Patient returns to my office for continued preventative foot care services. Complaint: Patient states" my nails have grown long and thick and become painful to walk and wear shoes" Patient has been taking coumadin.. The patient presents for preventative foot care services. No changes to ROS  Podiatric Exam: Vascular: dorsalis pedis and posterior tibial pulses are not  palpable bilateral due tp severe swelling feet.. Capillary return is immediate. Temperature gradient is WNL. Skin turgor WNL  Sensorium: Normal Semmes Weinstein monofilament test. Normal tactile sensation bilaterally. Nail Exam: Pt has thick disfigured discolored nails with subungual debris noted bilateral entire nail hallux through fifth toenails.  Pincer nails hallux  B/L Ulcer Exam: There is no evidence of ulcer or pre-ulcerative changes or infection. Orthopedic Exam: Muscle tone and strength are WNL. No limitations in general ROM. No crepitus or effusions noted. Foot type and digits show no abnormalities. Bony prominences are unremarkable. Skin: No Porokeratosis. No infection or ulcers  Diagnosis:  Onychomycosis, , Pain in right toe, pain in left toes  Treatment & Plan Procedures and Treatment: Consent by patient was obtained for treatment procedures.   Debridement of mycotic and hypertrophic toenails, 1 through 5 bilateral and clearing of subungual debris. No ulceration, no infection noted.  Return Visit-Office Procedure: Patient instructed to return to the office for a follow up visit 3 months for continued evaluation and treatment.    Gardiner Barefoot DPM

## 2018-11-17 ENCOUNTER — Ambulatory Visit (INDEPENDENT_AMBULATORY_CARE_PROVIDER_SITE_OTHER): Payer: Medicare Other | Admitting: Internal Medicine

## 2018-11-17 ENCOUNTER — Encounter: Payer: Self-pay | Admitting: Internal Medicine

## 2018-11-17 VITALS — BP 138/64 | HR 40 | Ht 69.0 in | Wt 207.0 lb

## 2018-11-17 DIAGNOSIS — R001 Bradycardia, unspecified: Secondary | ICD-10-CM

## 2018-11-17 DIAGNOSIS — I480 Paroxysmal atrial fibrillation: Secondary | ICD-10-CM

## 2018-11-17 NOTE — Progress Notes (Signed)
HPI Mr. Shackett is re-referred today for evaluation of bradycardia. He is a pleasant now 84 yo man with HTN, PAF, and sinus bradycardia. He has severe dizziness and was diagnosed with vertigo and has undergone PT/OT. He is better. He has had several syncopal episodes though none recently. The patient now has persistent atrial fib and slow heart rates. He has a lack of energy. He is also bothered by peripheral neuropathy. Allergies  Allergen Reactions  . Sulfa Antibiotics Other (See Comments)    Weakness, dizziness     Current Outpatient Medications  Medication Sig Dispense Refill  . acetaminophen (TYLENOL) 500 MG tablet Take 1,000 mg by mouth every 6 (six) hours as needed for mild pain.    . beta carotene w/minerals (OCUVITE) tablet Take 2 tablets by mouth daily.     . DULoxetine (CYMBALTA) 20 MG capsule Take 20 mg by mouth 2 (two) times daily.    . furosemide (LASIX) 40 MG tablet Take one and a half (1 1/2 tablets (60 mg) by mouth daily.    Marland Kitchen levothyroxine (SYNTHROID, LEVOTHROID) 25 MCG tablet Take 1 tablet (25 mcg total) by mouth daily before breakfast. 90 tablet 1  . losartan (COZAAR) 50 MG tablet Take 50 mg by mouth daily.  1  . LYRICA 25 MG capsule TAKE 2 CAPSULES BY MOUTH AT BED TIME. 180 capsule 1  . meclizine (ANTIVERT) 25 MG tablet Take 25 mg by mouth 2 (two) times daily as needed for dizziness.    . primidone (MYSOLINE) 250 MG tablet Take 250 mg by mouth 2 (two) times daily.   11  . Tamsulosin HCl (FLOMAX) 0.4 MG CAPS Take 0.4 mg by mouth daily after breakfast.     . tiZANidine (ZANAFLEX) 4 MG tablet Take 4 mg by mouth at bedtime as needed (for back pain).    . topiramate (TOPAMAX) 50 MG tablet TAKE 1 TABLET BY MOUTH TWICE A DAY 180 tablet 3  . traMADol (ULTRAM) 50 MG tablet Take 50 mg by mouth every 6 (six) hours as needed for moderate pain.    Marland Kitchen warfarin (COUMADIN) 5 MG tablet TAKE AS DIRECTED BY COUMADIN CLINIC 150 tablet 1   No current facility-administered  medications for this visit.      Past Medical History:  Diagnosis Date  . Chronic low back pain 12/15/2017  . Coronary artery disease   . Essential tremor 02/28/2016  . GERD (gastroesophageal reflux disease)   . Hernia   . Hyperlipemia   . Hypertension   . Macular degeneration   . Paroxysmal atrial fibrillation ([place]) 10/28/2013  . Tachycardia-bradycardia syndrome ([place]) 09/13/2014  . TIA (transient ischemic attack)     ROS:   All systems reviewed and negative except as noted in the HPI.   Past Surgical History:  Procedure Laterality Date  . APPENDECTOMY    . HERNIA REPAIR    . HERNIA REPAIR       Family History  Problem Relation Age of Onset  . GI problems Mother   . Other Sister        PAIN ISSUES  . Hearing loss Sister   . Blindness Sister   . Heart attack Neg Hx   . Stroke Neg Hx      Social History   Socioeconomic History  . Marital status: Widowed    Spouse name: Not on file  . Number of children: 1  . Years of education: 18  . Highest education level: Not on file  Occupational History    Comment: retired  Scientific laboratory technician  . Financial resource strain: Not on file  . Food insecurity:    Worry: Not on file    Inability: Not on file  . Transportation needs:    Medical: Not on file    Non-medical: Not on file  Tobacco Use  . Smoking status: Former Smoker    Last attempt to quit: 12/30/1962    Years since quitting: 55.9  . Smokeless tobacco: Never Used  Substance and Sexual Activity  . Alcohol use: No  . Drug use: No  . Sexual activity: Never  Lifestyle  . Physical activity:    Days per week: Not on file    Minutes per session: Not on file  . Stress: Not on file  Relationships  . Social connections:    Talks on phone: Not on file    Gets together: Not on file    Attends religious service: Not on file    Active member of club or organization: Not on file    Attends meetings of clubs or organizations: Not on file    Relationship status: Not on  file  . Intimate partner violence:    Fear of current or ex partner: Not on file    Emotionally abused: Not on file    Physically abused: Not on file    Forced sexual activity: Not on file  Other Topics Concern  . Not on file  Social History Narrative   Lives alone   Right-handed   Caffeine use- caffeine free coffee, occas soda     BP 138/64   Pulse (!) 40   Ht 5\' 9"  (1.753 m)   Wt 207 lb (93.9 kg)   SpO2 98%   BMI 30.57 kg/m   Physical Exam:  elderly appearing 84 yo man, NAD HEENT: Unremarkable Neck:  7 cm JVD, no thyromegally Lymphatics:  No adenopathy Back:  No CVA tenderness Lungs:  Clear with no wheezes HEART:  IRegular brady rhythm, no murmurs, no rubs, no clicks Abd:  soft, positive bowel sounds, no organomegally, no rebound, no guarding Ext:  2 plus pulses, no edema, no cyanosis, no clubbing Skin:  No rashes no nodules Neuro:  CN II through XII intact, motor grossly intact  EKG - atrial fib with a slow VR  DEVICE  Normal device function.  See PaceArt for details.   Assess/Plan: 1. Atrial fib - he is asymptomatic as far as palpitations are concerned.  2. CHB - he does have some conduction but minimal. I discussed a PPM with the patient and explained the risks/benefits/goals/expectations of PPM insertion and he will call us after we have discussed with Dr. Tamala Julian. 3. Diastolic heart failure - his symptoms are class 2.   Mikle Bosworth.D.

## 2018-11-17 NOTE — H&P (View-Only) (Signed)
HPI [name] is re-referred today for evaluation of bradycardia. He is a pleasant now 84 yo man with HTN, PAF, and sinus bradycardia. He has severe dizziness and was diagnosed with vertigo and has undergone PT/OT. He is better. He has had several syncopal episodes though none recently. The patient now has persistent atrial fib and slow heart rates. He has a lack of energy. He is also bothered by peripheral neuropathy. Allergies  Allergen Reactions  . Sulfa Antibiotics Other (See Comments)    Weakness, dizziness     Current Outpatient Medications  Medication Sig Dispense Refill  . acetaminophen (TYLENOL) 500 MG tablet Take 1,000 mg by mouth every 6 (six) hours as needed for mild pain.    . beta carotene w/minerals (OCUVITE) tablet Take 2 tablets by mouth daily.     . DULoxetine (CYMBALTA) 20 MG capsule Take 20 mg by mouth 2 (two) times daily.    . furosemide (LASIX) 40 MG tablet Take one and a half (1 1/2 tablets (60 mg) by mouth daily.    Marland Kitchen levothyroxine (SYNTHROID, LEVOTHROID) 25 MCG tablet Take 1 tablet (25 mcg total) by mouth daily before breakfast. 90 tablet 1  . losartan (COZAAR) 50 MG tablet Take 50 mg by mouth daily.  1  . LYRICA 25 MG capsule TAKE 2 CAPSULES BY MOUTH AT BED TIME. 180 capsule 1  . meclizine (ANTIVERT) 25 MG tablet Take 25 mg by mouth 2 (two) times daily as needed for dizziness.    . primidone (MYSOLINE) 250 MG tablet Take 250 mg by mouth 2 (two) times daily.   11  . Tamsulosin HCl (FLOMAX) 0.4 MG CAPS Take 0.4 mg by mouth daily after breakfast.     . tiZANidine (ZANAFLEX) 4 MG tablet Take 4 mg by mouth at bedtime as needed (for back pain).    . topiramate (TOPAMAX) 50 MG tablet TAKE 1 TABLET BY MOUTH TWICE A DAY 180 tablet 3  . traMADol (ULTRAM) 50 MG tablet Take 50 mg by mouth every 6 (six) hours as needed for moderate pain.    Marland Kitchen warfarin (COUMADIN) 5 MG tablet TAKE AS DIRECTED BY COUMADIN CLINIC 150 tablet 1   No current facility-administered  medications for this visit.      Past Medical History:  Diagnosis Date  . Chronic low back pain 12/15/2017  . Coronary artery disease   . Essential tremor 02/28/2016  . GERD (gastroesophageal reflux disease)   . Hernia   . Hyperlipemia   . Hypertension   . Macular degeneration   . Paroxysmal atrial fibrillation ([place]) 10/28/2013  . Tachycardia-bradycardia syndrome ([place]) 09/13/2014  . TIA (transient ischemic attack)     ROS:   All systems reviewed and negative except as noted in the HPI.   Past Surgical History:  Procedure Laterality Date  . APPENDECTOMY    . HERNIA REPAIR    . HERNIA REPAIR       Family History  Problem Relation Age of Onset  . GI problems Mother   . Other Sister        PAIN ISSUES  . Hearing loss Sister   . Blindness Sister   . Heart attack Neg Hx   . Stroke Neg Hx      Social History   Socioeconomic History  . Marital status: Widowed    Spouse name: Not on file  . Number of children: 1  . Years of education: 65  . Highest education level: Not on file  Occupational History    Comment: retired  Scientific laboratory technician  . Financial resource strain: Not on file  . Food insecurity:    Worry: Not on file    Inability: Not on file  . Transportation needs:    Medical: Not on file    Non-medical: Not on file  Tobacco Use  . Smoking status: Former Smoker    Last attempt to quit: 12/30/1962    Years since quitting: 55.9  . Smokeless tobacco: Never Used  Substance and Sexual Activity  . Alcohol use: No  . Drug use: No  . Sexual activity: Never  Lifestyle  . Physical activity:    Days per week: Not on file    Minutes per session: Not on file  . Stress: Not on file  Relationships  . Social connections:    Talks on phone: Not on file    Gets together: Not on file    Attends religious service: Not on file    Active member of club or organization: Not on file    Attends meetings of clubs or organizations: Not on file    Relationship status: Not on  file  . Intimate partner violence:    Fear of current or ex partner: Not on file    Emotionally abused: Not on file    Physically abused: Not on file    Forced sexual activity: Not on file  Other Topics Concern  . Not on file  Social History Narrative   Lives alone   Right-handed   Caffeine use- caffeine free coffee, occas soda     BP 138/64   Pulse (!) 40   Ht 5\' 9"  (1.753 m)   Wt 207 lb (93.9 kg)   SpO2 98%   BMI 30.57 kg/m   Physical Exam:  elderly appearing 84 yo man, NAD HEENT: Unremarkable Neck:  7 cm JVD, no thyromegally Lymphatics:  No adenopathy Back:  No CVA tenderness Lungs:  Clear with no wheezes HEART:  IRegular brady rhythm, no murmurs, no rubs, no clicks Abd:  soft, positive bowel sounds, no organomegally, no rebound, no guarding Ext:  2 plus pulses, no edema, no cyanosis, no clubbing Skin:  No rashes no nodules Neuro:  CN II through XII intact, motor grossly intact  EKG - atrial fib with a slow VR  DEVICE  Normal device function.  See PaceArt for details.   Assess/Plan: 1. Atrial fib - he is asymptomatic as far as palpitations are concerned.  2. CHB - he does have some conduction but minimal. I discussed a PPM with the patient and explained the risks/benefits/goals/expectations of PPM insertion and he will call us after we have discussed with Dr. Tamala Julian. 3. Diastolic heart failure - his symptoms are class 2.   Douglas Walker.D.

## 2018-11-17 NOTE — Patient Instructions (Addendum)
Medication Instructions:  Your physician recommends that you continue on your current medications as directed. Please refer to the Current Medication list given to you today.  Labwork: None ordered.  Testing/Procedures: None ordered.  Follow-Up: Your physician wants you to follow-up based on conversation with Dr. Tamala Julian.   Any Other Special Instructions Will Be Listed Below (If Applicable).  If you need a refill on your cardiac medications before your next appointment, please call your pharmacy.

## 2018-11-20 ENCOUNTER — Telehealth: Payer: Self-pay | Admitting: Internal Medicine

## 2018-11-20 DIAGNOSIS — I495 Sick sinus syndrome: Secondary | ICD-10-CM

## 2018-11-20 NOTE — Telephone Encounter (Signed)
Returned call to son.  Dr. Lovena Le advises placing a pacemaker.  Dates available in December given.  Will call back to discuss with son 11/24/2018

## 2018-11-20 NOTE — Telephone Encounter (Signed)
New Message   Patients son [name] is calling on behalf of his father. He states that when they were in the office on the 19th they were advised that Dr. Lovena Le was going to speak with Dr. Tamala Julian about the possibility of a pacemaker. Please call.

## 2018-11-24 ENCOUNTER — Other Ambulatory Visit: Payer: Self-pay | Admitting: *Deleted

## 2018-11-24 DIAGNOSIS — E039 Hypothyroidism, unspecified: Secondary | ICD-10-CM

## 2018-11-24 NOTE — Telephone Encounter (Signed)
Returned call to son.  Device scheduled for December 04, 2018.  Labs entered.  Instruction letter with soap left at front.

## 2018-12-02 ENCOUNTER — Telehealth: Payer: Self-pay | Admitting: Internal Medicine

## 2018-12-02 ENCOUNTER — Other Ambulatory Visit: Payer: Medicare Other | Admitting: *Deleted

## 2018-12-02 DIAGNOSIS — I495 Sick sinus syndrome: Secondary | ICD-10-CM

## 2018-12-02 DIAGNOSIS — E039 Hypothyroidism, unspecified: Secondary | ICD-10-CM

## 2018-12-02 NOTE — Telephone Encounter (Signed)
New Message:    Patient son calling, because he is having a pac maker put in and he is not sure if this is the right blood work for his father. Please call patient son.

## 2018-12-03 LAB — CBC WITH DIFFERENTIAL/PLATELET
BASOS: 1 %
Basophils Absolute: 0 10*3/uL (ref 0.0–0.2)
EOS (ABSOLUTE): 0.1 10*3/uL (ref 0.0–0.4)
EOS: 3 %
HEMATOCRIT: 33.8 % — AB (ref 37.5–51.0)
HEMOGLOBIN: 11.2 g/dL — AB (ref 13.0–17.7)
IMMATURE GRANULOCYTES: 1 %
Immature Grans (Abs): 0 10*3/uL (ref 0.0–0.1)
Lymphocytes Absolute: 1.2 10*3/uL (ref 0.7–3.1)
Lymphs: 28 %
MCH: 32.9 pg (ref 26.6–33.0)
MCHC: 33.1 g/dL (ref 31.5–35.7)
MCV: 99 fL — ABNORMAL HIGH (ref 79–97)
MONOS ABS: 0.5 10*3/uL (ref 0.1–0.9)
Monocytes: 11 %
NEUTROS ABS: 2.5 10*3/uL (ref 1.4–7.0)
NEUTROS PCT: 56 %
Platelets: 94 10*3/uL — CL (ref 150–450)
RBC: 3.4 x10E6/uL — ABNORMAL LOW (ref 4.14–5.80)
RDW: 14.6 % (ref 12.3–15.4)
WBC: 4.4 10*3/uL (ref 3.4–10.8)

## 2018-12-03 LAB — BASIC METABOLIC PANEL
BUN/Creatinine Ratio: 20 (ref 10–24)
BUN: 29 mg/dL (ref 10–36)
CO2: 23 mmol/L (ref 20–29)
CREATININE: 1.45 mg/dL — AB (ref 0.76–1.27)
Calcium: 9.1 mg/dL (ref 8.6–10.2)
Chloride: 105 mmol/L (ref 96–106)
GFR, EST AFRICAN AMERICAN: 48 mL/min/{1.73_m2} — AB (ref >59)
GFR, EST NON AFRICAN AMERICAN: 42 mL/min/{1.73_m2} — AB (ref >59)
Glucose: 117 mg/dL — ABNORMAL HIGH (ref 65–99)
POTASSIUM: 4.3 mmol/L (ref 3.5–5.2)
SODIUM: 142 mmol/L (ref 134–144)

## 2018-12-03 LAB — PROTIME-INR
INR: 2.3 — ABNORMAL HIGH (ref 0.8–1.2)
PROTHROMBIN TIME: 22.2 s — AB (ref 9.1–12.0)

## 2018-12-03 LAB — TSH: TSH: 6.73 u[IU]/mL — ABNORMAL HIGH (ref 0.450–4.500)

## 2018-12-04 ENCOUNTER — Encounter (HOSPITAL_COMMUNITY): Payer: Self-pay | Admitting: General Practice

## 2018-12-04 ENCOUNTER — Inpatient Hospital Stay (HOSPITAL_COMMUNITY)
Admission: RE | Admit: 2018-12-04 | Discharge: 2018-12-07 | DRG: 244 | Disposition: A | Discharge status: Skilled Nursing Facility | Payer: Medicare Other | Attending: Internal Medicine | Admitting: Internal Medicine

## 2018-12-04 ENCOUNTER — Other Ambulatory Visit: Payer: Self-pay

## 2018-12-04 ENCOUNTER — Inpatient Hospital Stay (HOSPITAL_COMMUNITY)
Admission: RE | Disposition: A | Discharge status: Skilled Nursing Facility | Payer: Self-pay | Source: Home / Self Care | Attending: Internal Medicine

## 2018-12-04 DIAGNOSIS — G8929 Other chronic pain: Secondary | ICD-10-CM | POA: Diagnosis present

## 2018-12-04 DIAGNOSIS — I4819 Other persistent atrial fibrillation: Secondary | ICD-10-CM | POA: Diagnosis present

## 2018-12-04 DIAGNOSIS — Z8673 Personal history of transient ischemic attack (TIA), and cerebral infarction without residual deficits: Secondary | ICD-10-CM

## 2018-12-04 DIAGNOSIS — R55 Syncope and collapse: Secondary | ICD-10-CM | POA: Diagnosis present

## 2018-12-04 DIAGNOSIS — E785 Hyperlipidemia, unspecified: Secondary | ICD-10-CM | POA: Diagnosis present

## 2018-12-04 DIAGNOSIS — Z882 Allergy status to sulfonamides status: Secondary | ICD-10-CM | POA: Diagnosis not present

## 2018-12-04 DIAGNOSIS — Z7901 Long term (current) use of anticoagulants: Secondary | ICD-10-CM

## 2018-12-04 DIAGNOSIS — M545 Low back pain: Secondary | ICD-10-CM | POA: Diagnosis present

## 2018-12-04 DIAGNOSIS — K219 Gastro-esophageal reflux disease without esophagitis: Secondary | ICD-10-CM | POA: Diagnosis present

## 2018-12-04 DIAGNOSIS — Z79899 Other long term (current) drug therapy: Secondary | ICD-10-CM

## 2018-12-04 DIAGNOSIS — I495 Sick sinus syndrome: Secondary | ICD-10-CM | POA: Diagnosis present

## 2018-12-04 DIAGNOSIS — G250 Essential tremor: Secondary | ICD-10-CM | POA: Diagnosis present

## 2018-12-04 DIAGNOSIS — I2510 Atherosclerotic heart disease of native coronary artery without angina pectoris: Secondary | ICD-10-CM | POA: Diagnosis present

## 2018-12-04 DIAGNOSIS — I10 Essential (primary) hypertension: Secondary | ICD-10-CM | POA: Diagnosis present

## 2018-12-04 DIAGNOSIS — H3530 Unspecified macular degeneration: Secondary | ICD-10-CM | POA: Diagnosis present

## 2018-12-04 DIAGNOSIS — R296 Repeated falls: Secondary | ICD-10-CM | POA: Diagnosis present

## 2018-12-04 DIAGNOSIS — Z87891 Personal history of nicotine dependence: Secondary | ICD-10-CM | POA: Diagnosis not present

## 2018-12-04 DIAGNOSIS — G629 Polyneuropathy, unspecified: Secondary | ICD-10-CM | POA: Diagnosis present

## 2018-12-04 DIAGNOSIS — Z79890 Hormone replacement therapy (postmenopausal): Secondary | ICD-10-CM

## 2018-12-04 DIAGNOSIS — Z950 Presence of cardiac pacemaker: Secondary | ICD-10-CM

## 2018-12-04 DIAGNOSIS — I442 Atrioventricular block, complete: Secondary | ICD-10-CM | POA: Diagnosis not present

## 2018-12-04 HISTORY — PX: INSERT / REPLACE / REMOVE PACEMAKER: SUR710

## 2018-12-04 HISTORY — PX: PACEMAKER IMPLANT: EP1218

## 2018-12-04 HISTORY — DX: Chronic kidney disease, stage 3 (moderate): N18.3

## 2018-12-04 HISTORY — DX: Presence of cardiac pacemaker: Z95.0

## 2018-12-04 HISTORY — DX: Chronic kidney disease, stage 3 unspecified: N18.30

## 2018-12-04 LAB — SURGICAL PCR SCREEN
MRSA, PCR: NEGATIVE
STAPHYLOCOCCUS AUREUS: NEGATIVE

## 2018-12-04 LAB — PROTIME-INR
INR: 1.37
PROTHROMBIN TIME: 16.7 s — AB (ref 11.4–15.2)

## 2018-12-04 SURGERY — PACEMAKER IMPLANT

## 2018-12-04 MED ORDER — FUROSEMIDE 40 MG PO TABS
60.0000 mg | ORAL_TABLET | Freq: Every day | ORAL | Status: DC
  Administered 2018-12-04 – 2018-12-07 (×4): 60 mg via ORAL
  Filled 2018-12-04 (×4): qty 1

## 2018-12-04 MED ORDER — DULOXETINE HCL 20 MG PO CPEP
20.0000 mg | ORAL_CAPSULE | Freq: Two times a day (BID) | ORAL | Status: DC
  Administered 2018-12-04 – 2018-12-07 (×6): 20 mg via ORAL
  Filled 2018-12-04 (×8): qty 1

## 2018-12-04 MED ORDER — LEVOTHYROXINE SODIUM 25 MCG PO TABS
25.0000 ug | ORAL_TABLET | Freq: Every day | ORAL | Status: DC
  Administered 2018-12-05 – 2018-12-07 (×3): 25 ug via ORAL
  Filled 2018-12-04 (×3): qty 1

## 2018-12-04 MED ORDER — LOSARTAN POTASSIUM 50 MG PO TABS
50.0000 mg | ORAL_TABLET | Freq: Every day | ORAL | Status: DC
  Administered 2018-12-05 – 2018-12-07 (×3): 50 mg via ORAL
  Filled 2018-12-04 (×4): qty 1

## 2018-12-04 MED ORDER — ONDANSETRON HCL 4 MG/2ML IJ SOLN: 4.0000 mg | Freq: Four times a day (QID) | INTRAMUSCULAR | Status: DC | PRN

## 2018-12-04 MED ORDER — OCUVITE-LUTEIN PO CAPS
1.0000 | ORAL_CAPSULE | Freq: Every day | ORAL | Status: DC
  Filled 2018-12-04: qty 1

## 2018-12-04 MED ORDER — MUPIROCIN 2 % EX OINT
TOPICAL_OINTMENT | CUTANEOUS | Status: AC
  Administered 2018-12-04: 1 via NASAL
  Filled 2018-12-04: qty 22

## 2018-12-04 MED ORDER — HEPARIN (PORCINE) IN NACL 1000-0.9 UT/500ML-% IV SOLN
INTRAVENOUS | Status: DC | PRN
  Administered 2018-12-04: 500 mL

## 2018-12-04 MED ORDER — HEPARIN (PORCINE) IN NACL 1000-0.9 UT/500ML-% IV SOLN
INTRAVENOUS | Status: AC
  Filled 2018-12-04: qty 500

## 2018-12-04 MED ORDER — IOPAMIDOL (ISOVUE-370) INJECTION 76%
INTRAVENOUS | Status: DC | PRN
  Administered 2018-12-04: 10 mL via INTRAVENOUS

## 2018-12-04 MED ORDER — SODIUM CHLORIDE 0.9 % IV SOLN
80.0000 mg | INTRAVENOUS | Status: AC
  Administered 2018-12-04: 80 mg
  Filled 2018-12-04: qty 2

## 2018-12-04 MED ORDER — ACETAMINOPHEN 325 MG PO TABS
325.0000 mg | ORAL_TABLET | ORAL | Status: DC | PRN
  Administered 2018-12-05 – 2018-12-06 (×2): 650 mg via ORAL
  Filled 2018-12-04 (×2): qty 2

## 2018-12-04 MED ORDER — CEFAZOLIN SODIUM-DEXTROSE 1-4 GM/50ML-% IV SOLN
1.0000 g | Freq: Four times a day (QID) | INTRAVENOUS | Status: AC
  Administered 2018-12-04 – 2018-12-05 (×3): 1 g via INTRAVENOUS
  Filled 2018-12-04 (×3): qty 50

## 2018-12-04 MED ORDER — PRIMIDONE 250 MG PO TABS
250.0000 mg | ORAL_TABLET | Freq: Two times a day (BID) | ORAL | Status: DC
  Administered 2018-12-04 – 2018-12-07 (×6): 250 mg via ORAL
  Filled 2018-12-04 (×8): qty 1

## 2018-12-04 MED ORDER — PROSIGHT PO TABS
1.0000 | ORAL_TABLET | Freq: Every day | ORAL | Status: DC
  Administered 2018-12-05 – 2018-12-07 (×3): 1 via ORAL
  Filled 2018-12-04 (×4): qty 1

## 2018-12-04 MED ORDER — CHLORHEXIDINE GLUCONATE 4 % EX LIQD
60.0000 mL | Freq: Once | CUTANEOUS | Status: DC
  Filled 2018-12-04: qty 60

## 2018-12-04 MED ORDER — ACETAMINOPHEN 500 MG PO TABS
1000.0000 mg | ORAL_TABLET | Freq: Four times a day (QID) | ORAL | Status: DC | PRN
  Administered 2018-12-05: 1000 mg via ORAL
  Filled 2018-12-04: qty 2

## 2018-12-04 MED ORDER — CEFAZOLIN SODIUM-DEXTROSE 2-4 GM/100ML-% IV SOLN
INTRAVENOUS | Status: AC
  Filled 2018-12-04: qty 100

## 2018-12-04 MED ORDER — CEFAZOLIN SODIUM-DEXTROSE 2-4 GM/100ML-% IV SOLN
2.0000 g | INTRAVENOUS | Status: AC
  Administered 2018-12-04: 2 g via INTRAVENOUS
  Filled 2018-12-04: qty 100

## 2018-12-04 MED ORDER — HEPARIN (PORCINE) IN NACL 1000-0.9 UT/500ML-% IV SOLN
INTRAVENOUS | Status: AC
  Filled 2018-12-04: qty 1000

## 2018-12-04 MED ORDER — LIDOCAINE HCL 1 % IJ SOLN
INTRAMUSCULAR | Status: AC
  Filled 2018-12-04: qty 60

## 2018-12-04 MED ORDER — YOU HAVE A PACEMAKER BOOK
Freq: Once | Status: AC
  Administered 2018-12-04: 1
  Filled 2018-12-04: qty 1

## 2018-12-04 MED ORDER — SODIUM CHLORIDE 0.9 % IV SOLN
INTRAVENOUS | Status: AC
  Filled 2018-12-04: qty 2

## 2018-12-04 MED ORDER — SODIUM CHLORIDE 0.9 % IV SOLN
INTRAVENOUS | Status: DC
  Administered 2018-12-04: 10:00:00 via INTRAVENOUS

## 2018-12-04 MED ORDER — LIDOCAINE HCL (PF) 1 % IJ SOLN
INTRAMUSCULAR | Status: DC | PRN
  Administered 2018-12-04: 60 mL

## 2018-12-04 MED ORDER — TAMSULOSIN HCL 0.4 MG PO CAPS
0.4000 mg | ORAL_CAPSULE | Freq: Every evening | ORAL | Status: DC
  Administered 2018-12-04 – 2018-12-06 (×3): 0.4 mg via ORAL
  Filled 2018-12-04 (×3): qty 1

## 2018-12-04 MED ORDER — IOPAMIDOL (ISOVUE-370) INJECTION 76%
INTRAVENOUS | Status: AC
  Filled 2018-12-04: qty 50

## 2018-12-04 SURGICAL SUPPLY — 12 items
CABLE SURGICAL S-101-97-12 (CABLE) ×3 IMPLANT
CATH RIGHTSITE C315HIS02 (CATHETERS) ×3 IMPLANT
IPG PACE AZUR XT DR MRI W1DR01 (Pacemaker) ×1 IMPLANT
LEAD CAPSURE NOVUS 5076-58CM (Lead) ×3 IMPLANT
LEAD SELECT SECURE 3830 383069 (Lead) ×1 IMPLANT
PACE AZURE XT DR MRI W1DR01 (Pacemaker) ×3 IMPLANT
PAD PRO RADIOLUCENT 2001M-C (PAD) ×3 IMPLANT
SELECT SECURE 3830 383069 (Lead) ×3 IMPLANT
SHEATH CLASSIC 7F (SHEATH) ×6 IMPLANT
SLITTER 6232ADJ (MISCELLANEOUS) ×3 IMPLANT
TRAY PACEMAKER INSERTION (PACKS) ×3 IMPLANT
WIRE HI TORQ VERSACORE-J 145CM (WIRE) ×3 IMPLANT

## 2018-12-04 NOTE — Progress Notes (Signed)
Patient with multiple risk factors for safety at home, including:  Advanced age, lives alone, vertigo, poor vision, poor hearing, bilateral LE edema, bilateral LE neuropathy, coumadin use, impulsivity and questionable judgement re: safety issues.   Sates he uses a cane and "the walls" to get around at home.  Multiple falls, including falling into the bathtub while reaching for a towel.  Mobility further impaired now by restrictions to left arm s/p PPM implantation.  Also questionable whether he will be compliant with left arm restrictions.  Safety concerns discussed with patient and son.  PT and OT evals ordered.

## 2018-12-04 NOTE — Interval H&P Note (Signed)
History and Physical Interval Note:  12/04/2018 9:57 AM  Douglas Walker  has presented today for surgery, with the diagnosis of hb  The various methods of treatment have been discussed with the patient and family. After consideration of risks, benefits and other options for treatment, the patient has consented to  Procedure(s): PACEMAKER IMPLANT (N/A) as a surgical intervention .  The patient's history has been reviewed, patient examined, no change in status, stable for surgery.  I have reviewed the patient's chart and labs.  Questions were answered to the patient's satisfaction.     Douglas Walker

## 2018-12-04 NOTE — Discharge Instructions (Signed)
° ° °  Supplemental Discharge Instructions for  Pacemaker/Defibrillator Patients  Activity No heavy lifting or vigorous activity with your left/right arm for 6 to 8 weeks.  Do not raise your left/right arm above your head for one week.  Gradually raise your affected arm as drawn below.             12/08/18                   12/09/18                   12/10/18                12/11/18 __  NO DRIVING for  1 week   ; you may begin driving on   02/63/78  .  WOUND CARE - Keep the wound area clean and dry.  Do not get this area wet for one week. No showers for one week; you may shower on   12/11/18  . - The tape/steri-strips on your wound will fall off; do not pull them off.  No bandage is needed on the site.  DO  NOT apply any creams, oils, or ointments to the wound area. - If you notice any drainage or discharge from the wound, any swelling or bruising at the site, or you develop a fever > 101? F after you are discharged home, call the office at once.  Special Instructions - You are still able to use cellular telephones; use the ear opposite the side where you have your pacemaker/defibrillator.  Avoid carrying your cellular phone near your device. - When traveling through airports, show security personnel your identification card to avoid being screened in the metal detectors.  Ask the security personnel to use the hand wand. - Avoid arc welding equipment, MRI testing (magnetic resonance imaging), TENS units (transcutaneous nerve stimulators).  Call the office for questions about other devices. - Avoid electrical appliances that are in poor condition or are not properly grounded. - Microwave ovens are safe to be near or to operate.

## 2018-12-05 ENCOUNTER — Ambulatory Visit (HOSPITAL_COMMUNITY): Payer: Medicare Other

## 2018-12-05 DIAGNOSIS — I2510 Atherosclerotic heart disease of native coronary artery without angina pectoris: Secondary | ICD-10-CM | POA: Diagnosis not present

## 2018-12-05 DIAGNOSIS — I4819 Other persistent atrial fibrillation: Secondary | ICD-10-CM | POA: Diagnosis not present

## 2018-12-05 DIAGNOSIS — E785 Hyperlipidemia, unspecified: Secondary | ICD-10-CM | POA: Diagnosis not present

## 2018-12-05 DIAGNOSIS — G250 Essential tremor: Secondary | ICD-10-CM | POA: Diagnosis not present

## 2018-12-05 DIAGNOSIS — I442 Atrioventricular block, complete: Principal | ICD-10-CM

## 2018-12-05 DIAGNOSIS — I10 Essential (primary) hypertension: Secondary | ICD-10-CM | POA: Diagnosis not present

## 2018-12-05 DIAGNOSIS — R918 Other nonspecific abnormal finding of lung field: Secondary | ICD-10-CM | POA: Diagnosis not present

## 2018-12-05 DIAGNOSIS — R55 Syncope and collapse: Secondary | ICD-10-CM | POA: Diagnosis not present

## 2018-12-05 DIAGNOSIS — M545 Low back pain: Secondary | ICD-10-CM | POA: Diagnosis not present

## 2018-12-05 DIAGNOSIS — G629 Polyneuropathy, unspecified: Secondary | ICD-10-CM | POA: Diagnosis not present

## 2018-12-05 DIAGNOSIS — K219 Gastro-esophageal reflux disease without esophagitis: Secondary | ICD-10-CM | POA: Diagnosis not present

## 2018-12-05 DIAGNOSIS — G8929 Other chronic pain: Secondary | ICD-10-CM | POA: Diagnosis not present

## 2018-12-05 DIAGNOSIS — I495 Sick sinus syndrome: Secondary | ICD-10-CM | POA: Diagnosis not present

## 2018-12-05 MED ORDER — WARFARIN - PHYSICIAN DOSING INPATIENT: Freq: Every day | Status: DC

## 2018-12-05 MED ORDER — MECLIZINE HCL 25 MG PO TABS
12.5000 mg | ORAL_TABLET | Freq: Once | ORAL | Status: DC | PRN
  Filled 2018-12-05: qty 1

## 2018-12-05 MED ORDER — WARFARIN SODIUM 5 MG PO TABS
5.0000 mg | ORAL_TABLET | Freq: Once | ORAL | Status: AC
  Administered 2018-12-05: 5 mg via ORAL
  Filled 2018-12-05: qty 1

## 2018-12-05 NOTE — Progress Notes (Signed)
Rehab Admissions Coordinator Note:  Per PT and OT recommendation, Patient was screened by Jhonnie Garner for appropriateness for an Inpatient Acute Rehab Consult. Noted pt is currently in "Outpatient in Bed" status; With such status, I doubt he has the medical necessity to justify an inpatient rehab stay. However, if his status were to change to inpatient, he may then be considered for an inpatient rehab consult.     Jhonnie Garner 12/05/2018, 4:53 PM  I can be reached at [phone].

## 2018-12-05 NOTE — Progress Notes (Signed)
Doing well s/p PPM implantation  CXR reveals stable leads, no ptx.  Device interrogation is reviewed and reveals normal His bundle pacing.  Interestingly, the His lead is in the atrial port and there is an RV lead in the RV port.  The device is programmed DDI with an 80 msec delay and pacing from both leads is noted with pseudofusion from the RV lead.  There is no atrial lead.  EKG reveals nonselective His capture.  DC to home Routine wound care and follow-up Resume anticoagulation with coumadin  Thompson Grayer MD, [facility] 12/05/2018 8:50 AM

## 2018-12-05 NOTE — Evaluation (Signed)
Physical Therapy Evaluation Patient Details Name: Douglas Walker MRN: [id number] DOB: May 25, 1925 Today's Date: 12/05/2018   History of Present Illness  Pt is a 84 yo man with HTN, PAF, and sinus bradycardia. PMH includes vertigo and has undergone PT (2016). He has had several syncopal episodes and increasing falls at home recently. The patient now has persistent atrial fib and slow heart rates. He has a lack of energy. He is also bothered by peripheral neuropathy. Pt now s/p PPM implantation.   Clinical Impression  Pt was assessed and recommend to CIR as he should have help with caregivers based on families' planning to get people to come stay with him.  They are aware that the need is currently 24/7 and should be able to recover to some degree from rehab.  Should have a better idea after inpt care what assistance at home is finally needed.  Follow acutely for standing balance and control of his use of some different assistive devices.  Currently does not have a recommendation for an AD as he is backward leaning and unable to stand or transfer without assistance.    Follow Up Recommendations CIR    Equipment Recommendations  None recommended by PT    Recommendations for Other Services Rehab consult     Precautions / Restrictions Precautions Precautions: Fall;ICD/Pacemaker Precaution Comments: multiple falls recently Required Braces or Orthoses: Sling Restrictions Weight Bearing Restrictions: Yes LUE Weight Bearing: Non weight bearing      Mobility  Bed Mobility Overal bed mobility: Needs Assistance Bed Mobility: Supine to Sit;Sit to Supine     Supine to sit: Mod assist;+2 for physical assistance;+2 for safety/equipment;HOB elevated Sit to supine: Mod assist;+2 for physical assistance;+2 for safety/equipment;HOB elevated   General bed mobility comments: 2 person assist due to pt follow through on instructions being slow and concerns for restrictions of pacemaker  surgery  Transfers Overall transfer level: Needs assistance Equipment used: 1 person hand held assist;Straight cane Transfers: Sit to/from Stand Sit to Stand: Mod assist;+2 physical assistance;+2 safety/equipment;From elevated surface         General transfer comment: pt is slow to respond to cues from possibly hearing and cognition  Ambulation/Gait Ambulation/Gait assistance: Min assist;+2 physical assistance;+2 safety/equipment(two person due to his response time and backward lean ) Gait Distance (Feet): 5 Feet Assistive device: 1 person hand held assist;Straight cane Gait Pattern/deviations: Step-to pattern;Decreased stride length;Wide base of support;Ataxic;Shuffle;Decreased weight shift to left Gait velocity: reduced(lists against bed to control backward LOB) Gait velocity interpretation: <1.8 ft/sec, indicate of risk for recurrent falls General Gait Details: pt is struggling to control standing with LLE not accepting wgt well and being in sling on LUE  Stairs            Wheelchair Mobility    Modified Rankin (Stroke Patients Only)       Balance Overall balance assessment: History of Falls;Needs assistance Sitting-balance support: Feet supported;Bilateral upper extremity supported Sitting balance-Leahy Scale: Fair     Standing balance support: Single extremity supported;During functional activity Standing balance-Leahy Scale: Poor Standing balance comment: hard backward lean using bed to steady                             Pertinent Vitals/Pain Pain Assessment: 0-10 Pain Score: 2  Pain Location: incision site Pain Descriptors / Indicators: Operative site guarding    Home Living Family/patient expects to be discharged to:: Private residence Living Arrangements: Alone(family aware of need  for care 24/7) Available Help at Discharge: Family;Friend(s);Available 24 hours/day Type of Home: House Home Access: Stairs to enter Entrance Stairs-Rails:  Right Entrance Stairs-Number of Steps: 3 Home Layout: Multi-level;Laundry or work area in basement;Able to live on main level with bedroom/bathroom Home Equipment: Kasandra Knudsen - single point;Bedside commode;Shower seat;Grab bars - tub/shower Additional Comments: Pt's son and daughter in law very active and helpful    Prior Function Level of Independence: Independent with assistive device(s)         Comments: uses SPC     Hand Dominance   Dominant Hand: Right    Extremity/Trunk Assessment   Upper Extremity Assessment Upper Extremity Assessment: LUE deficits/detail;Defer to OT evaluation    Lower Extremity Assessment Lower Extremity Assessment: Generalized weakness    Cervical / Trunk Assessment Cervical / Trunk Assessment: Kyphotic  Communication   Communication: HOH  Cognition Arousal/Alertness: Awake/alert Behavior During Therapy: WFL for tasks assessed/performed Overall Cognitive Status: Difficult to assess                                 General Comments: pt is not very verbal and waits to respond to questions, some communication delay      General Comments General comments (skin integrity, edema, etc.): Pt is up to stand on [facility] but cannot self correct balance and cannot demonstrate full awareness of his limits, and how to control them    Exercises     Assessment/Plan    PT Assessment Patient needs continued PT services  PT Problem List Decreased strength;Decreased range of motion;Decreased activity tolerance;Decreased balance;Decreased mobility;Decreased coordination;Decreased knowledge of use of DME;Decreased safety awareness;Cardiopulmonary status limiting activity;Obesity;Decreased skin integrity;Pain       PT Treatment Interventions DME instruction;Gait training;Stair training;Functional mobility training;Therapeutic activities;Therapeutic exercise;Balance training;Neuromuscular re-education;Patient/family education    PT Goals (Current goals can  be found in the Care Plan section)  Acute Rehab PT Goals Patient Stated Goal: to get home and walk PT Goal Formulation: With patient/family Time For Goal Achievement: 12/19/18 Potential to Achieve Goals: Good    Frequency Min 2X/week   Barriers to discharge Inaccessible home environment;Decreased caregiver support home alone with stairs    Co-evaluation PT/OT/SLP Co-Evaluation/Treatment: Yes Reason for Co-Treatment: Complexity of the patient's impairments (multi-system involvement);For patient/therapist safety;To address functional/ADL transfers PT goals addressed during session: Mobility/safety with mobility;Proper use of DME;Balance         AM-PAC PT "6 Clicks" Mobility  Outcome Measure Help needed turning from your back to your side while in a flat bed without using bedrails?: A Lot Help needed moving from lying on your back to sitting on the side of a flat bed without using bedrails?: A Lot Help needed moving to and from a bed to a chair (including a wheelchair)?: A Lot Help needed standing up from a chair using your arms (e.g., wheelchair or bedside chair)?: A Lot Help needed to walk in hospital room?: A Lot Help needed climbing 3-5 steps with a railing? : Total 6 Click Score: 11    End of Session Equipment Utilized During Treatment: Gait belt Activity Tolerance: Patient limited by fatigue;Other (comment)(new sling on LUE layered onto balance issues emerging at hom) Patient left: in bed;with call bell/phone within reach;with bed alarm set;with family/visitor present Nurse Communication: Mobility status;Other (comment)(plans for DC) PT Visit Diagnosis: Unsteadiness on feet (R26.81);Muscle weakness (generalized) (M62.81);Repeated falls (R29.6);Ataxic gait (R26.0);Difficulty in walking, not elsewhere classified (R26.2);Adult, failure to thrive (R62.7);Pain Pain -  Right/Left: Left Pain - part of body: Shoulder    Time: 1101-1131 PT Time Calculation (min) (ACUTE ONLY): 30  min   Charges:   PT Evaluation $PT Eval Moderate Complexity: 1 Mod PT Treatments $Therapeutic Activity: 8-22 mins       Ramond Dial 12/05/2018, 12:38 PM   Mee Hives, PT MS Acute Rehab Dept. Number: [place]

## 2018-12-05 NOTE — Progress Notes (Addendum)
Pt has discharge order. Seen by Case Manager. Face to face completed and order for Home Health with [facility] received. OOB to ambulate to chair for dinner. C/O weakness and dizziness. Son and daughter in law in room refusing to take pt home. Dr. Garrison Columbus notified. Order received for Meclizine.

## 2018-12-05 NOTE — Discharge Summary (Addendum)
Discharge Summary    Patient ID: RECTOR DEVONSHIRE MRN: [id number]; DOB: [birth year]  Admit date: 12/04/2018 Discharge date:  12/07/18  Primary Care Provider: Josetta Huddle, MD  Primary Cardiologist: Sinclair Grooms, MD  Primary Electrophysiologist:  [name], MD   Discharge Diagnoses    Active Problems:   Complete heart block (HCC)   Recurrent syncope   PAF   Recurrent falls   CAD    Coumadin therapy   Allergies Allergies  Allergen Reactions  . Sulfa Antibiotics Other (See Comments)    Weakness, dizziness    Diagnostic Studies/Procedures    PACEMAKER IMPLANT  Conclusion   CONCLUSIONS:   1. Successful implantation of a Medtronic dual-chamber pacemaker for symptomatic bradycardia due to complete heart block  2. No early apparent complications.    Details SURGEON: [name], MD   PREPROCEDURE DIAGNOSIS: Symptomatic Bradycardia due to atrial fib and complete heart block  POSTPROCEDURE DIAGNOSIS: Same as preprocedure diagnosis  PROCEDURES:  1. Left upper extremity venography.  2. Pacemaker implantation.   INTRODUCTION: Douglas Walker is a 84 y.o. male with a history of bradycardia who presents today for pacemaker implantation. The patient reports intermittent episodes of dizziness over the past few months. No reversible causes have been identified. The patient therefore presents today for pacemaker implantation.   DESCRIPTION OF PROCEDURE: Informed written consent was obtained, and  the patient was brought to the electrophysiology lab in a fasting state. The patient required no sedation for the procedure today. The patients left chest was prepped and draped in the usual sterile fashion by the EP lab staff. The skin overlying the left deltopectoral region was infiltrated with lidocaine for local analgesia. A 4-cm incision was made over the left deltopectoral region. A left subcutaneous pacemaker pocket was fashioned using a combination of sharp and blunt  dissection. Electrocautery was required to assure hemostasis.   Left Upper Extremity Venography: A venogram of the left upper extremity was performed, which revealed a large left cephalic vein, which emptied into a large left subclavian vein. The left axillary vein was moderate in size.   RA/RV Lead Placement: The left axillary vein was punctured. Through the left axillary vein, a Medtronic [id number] (serial number [id number]) right atrial/His bundle lead and a Medtronic [id number] (serial number [id number]) right ventricular lead were advanced with fluoroscopic visualization into the right atrial appendage and right ventricular apical septal positions respectively. Initial atrial lead P- waves measured 2 mV with impedance of 532 ohms and a threshold of 1 V at 1 msec. Right ventricular lead R-waves measured 6 mV with an impedance of 954 ohms and a threshold of 0.5 V at 0.5 msec. Both leads were secured to the pectoralis fascia using #2-0 silk over the suture sleeves.   Device Placement: The leads were then connected to a Medtronic Azure (serial number [id number]) pacemaker. The pocket was irrigated with copious gentamicin solution. The pacemaker was then placed into the pocket. The pocket was then closed in 2 layers with 2.0 Vicryl suture for the subcutaneous and subcuticular layers. Steri-Strips and a sterile dressing were then applied. There were no early apparent complications.    Estimated blood loss <50 mL.   During this procedure no sedation was administered.      History of Present Illness     Douglas Walker is a 84 y.o. male with a hx of tachycardia-bradycardia syndrome per 30 day monitor, recurring syncopal episodes, paroxysmal atrial fibrillation, chronic anticoagulation therapy with warfarin, prior TIA,andcoronary artery disease  presented for schedule Pacemaker placement.   84 year old gentleman, still living independently at home.  Has had relatively frequent falls.  He is on  anticoagulation therapy and has not had any hot head trauma.  He has had some extremity lacerations with prolonged bleeding. He has severe dizziness and was diagnosed with vertigo and has undergone PT/OT. The patient was evaluated by Dr. Lovena Le and recommended pacemaker.   Hospital Course     Consultants: None  [name] underwent successful implantation of a Medtronic dual-chamber pacemaker for symptomatic bradycardia due to complete heart block. No immediate complications. CXR without pneumothorax. Device interrogation revealed normal His bundle pacing.  Interestingly, the His lead is in the atrial port and there is an RV lead in the RV port.  The device is programmed DDI with an 80 msec delay and pacing from both leads is noted with pseudofusion from the RV lead. Evaluated by PT/OT due to frequent falls. Resume home meds.   She has been seen by Dr. Rayann Heman today and deemed ready for discharge home. All follow-up appointments have been scheduled. Discharge medications are listed below.   Discharge Vitals Blood pressure 129/66, pulse 70, temperature 98.9 F (37.2 C), temperature source Oral, resp. rate 18, height 5\' 9"  (1.753 m), weight 96.5 kg, SpO2 94 %.  Filed Weights   12/04/18 0924 12/05/18 0304  Weight: 95.3 kg 96.5 kg   Physical Exam  Constitutional: He is oriented to person, place, and time and well-developed, well-nourished, and in no distress.  HENT:  Head: Normocephalic and atraumatic.  Eyes: Pupils are equal, round, and reactive to light. EOM are normal.  Neck: Normal range of motion. Neck supple.  Cardiovascular: Normal rate and regular rhythm.  L upper chest pain without erythema or edema  Pulmonary/Chest: Effort normal and breath sounds normal.  Abdominal: Soft. Bowel sounds are normal.  Musculoskeletal: Normal range of motion.  Neurological: He is alert and oriented to person, place, and time.  Skin: Skin is warm and dry.  Psychiatric: Affect normal.   Labs &  Radiologic Studies    CBC Recent Labs    12/02/18 1017  WBC 4.4  NEUTROABS 2.5  HGB 11.2*  HCT 33.8*  MCV 99*  PLT 94*   Basic Metabolic Panel Recent Labs    12/02/18 1017  NA 142  K 4.3  CL 105  CO2 23  GLUCOSE 117*  BUN 29  CREATININE 1.45*  CALCIUM 9.1   Thyroid Function Tests Recent Labs    12/02/18 1017  TSH 6.730*   _____________  Dg Chest 2 View  Result Date: 12/05/2018 CLINICAL DATA:  Pacemaker placement. EXAM: CHEST - 2 VIEW COMPARISON:  Chest x-ray dated April 12, 2016. FINDINGS: Interval dual lead left chest wall AICD placement. Cardiomegaly. Tortuous, atherosclerotic thoracic aorta. Normal pulmonary vascularity. Wedge-shaped opacity in the posterior left lower lobe. Trace left pleural effusion. No pneumothorax. Skin fold over the left hemithorax. No acute osseous abnormality. IMPRESSION: 1. Wedge-shaped opacity in the posterior left lower lobe, favored to reflect atelectasis. Trace left pleural effusion. 2. Cardiomegaly status post interval left chest wall AICD placement. No pneumothorax. Electronically Signed   By: Titus Dubin M.D.   On: 12/05/2018 09:09   Disposition   Pt is being discharged home today in good condition.  Follow-up Plans & Appointments    Follow-up Information    [facility].   Specialty:  Cardiology Why:  12/17/18 @ 11:00AM, coumadin clinic Contact information: [address]  [address] Follow up.   Specialty:  Cardiology Why:  12/17/18 @ 12:00PM (noon), wound check visit Contact information: [address] [place]       Evans Lance, MD Follow up.   Specialty:  Cardiology Why:  03/17/19 @ 11:15AM Contact information: [address] [address] [phone]          Discharge Instructions    Diet - low sodium  heart healthy   Complete by:  As directed    Increase activity slowly   Complete by:  As directed       Discharge Medications   Allergies as of 12/05/2018      Reactions   Sulfa Antibiotics Other (See Comments)   Weakness, dizziness      Medication List    TAKE these medications   acetaminophen 500 MG tablet Commonly known as:  TYLENOL Take 1,000 mg by mouth every 6 (six) hours as needed for mild pain.   beta carotene w/minerals tablet Take 1 tablet by mouth daily.   DULoxetine 20 MG capsule Commonly known as:  CYMBALTA Take 20 mg by mouth 2 (two) times daily.   furosemide 40 MG tablet Commonly known as:  LASIX Take 60 mg by mouth daily. Take one and a half (1 1/2 tablets (60 mg) by mouth daily.   levothyroxine 25 MCG tablet Commonly known as:  SYNTHROID, LEVOTHROID Take 1 tablet (25 mcg total) by mouth daily before breakfast.   losartan 50 MG tablet Commonly known as:  COZAAR Take 50 mg by mouth daily.   LYRICA 25 MG capsule Generic drug:  pregabalin TAKE 2 CAPSULES BY MOUTH AT BED TIME. What changed:  See the new instructions.   primidone 250 MG tablet Commonly known as:  MYSOLINE Take 250 mg by mouth 2 (two) times daily.   tamsulosin 0.4 MG Caps capsule Commonly known as:  FLOMAX Take 0.4 mg by mouth every evening.   topiramate 50 MG tablet Commonly known as:  TOPAMAX TAKE 1 TABLET BY MOUTH TWICE A DAY   warfarin 5 MG tablet Commonly known as:  COUMADIN Take as directed. If you are unsure how to take this medication, talk to your nurse or doctor. Original instructions:  TAKE AS DIRECTED BY COUMADIN CLINIC What changed:    how much to take  how to take this  when to take this  additional instructions        Acute coronary syndrome (MI, NSTEMI, STEMI, etc) this admission?: No.    Outstanding Labs/Studies   None  Duration of Discharge Encounter   Greater than 30 minutes including physician time.  Signed, Leanor Kail,  PA 12/05/2018, 10:16 AM   PT recommends SNF.  Patient and family are agreeable.  Will therefore keep in hospital over the weekend with plans for SNF placement early next week.  Thompson Grayer MD, [facility] 12/05/2018 3:58 PM   ADDEND 12/07/18: DISCHARGE DATE CORRECTED Patient's discharge held for SNF/rehab placement. The patient was seen and examined by Dr. Lovena Le this morning.   Cleared for discharge when SNF arrangements are completed, no change to home medicines.  OK to continue home dose warfarin/regime to allow therapeutic INR at this juncture. (his last dosing schedule was 5mg  on Monday's, 7.5mg  all other days) Follow up is in place  ACTIVITY RESTRICTIONS No heavy lifting or vigorous activity with your  left/right arm for 6 to 8 weeks.  Do not raise your left/right arm above your head for one week.  Gradually raise your affected arm as drawn below.             12/08/18                   12/09/18                   12/10/18                12/11/18 __  NO DRIVING for  1 week   ; you may begin driving on   37/85/88  .  WOUND CARE  Keep the wound area clean and dry.  Do not get this area wet for one week. No showers for one week; you may shower on   12/11/18  .  The tape/steri-strips on your wound will fall off; do not pull them off.  No bandage is needed on the site.  DO  NOT apply any creams, oils, or ointments to the wound area.  If you notice any drainage or discharge from the wound, any swelling or bruising at the site, or you develop a fever > 101? F after you are discharged home, call the office at once.  Tommye Standard, PA-C 12/07/2018   EP Attending  Patient seen and examined. Agree with the findings as noted above. He is stable for DC if ok with the PT/OT service.  Mikle Bosworth.D.

## 2018-12-05 NOTE — Evaluation (Signed)
Occupational Therapy Evaluation Patient Details Name: Douglas Walker MRN: [id number] DOB: 07-Sep-1925 Today's Date: 12/05/2018    History of Present Illness Pt is a 84 yo man with HTN, macular degeneration, PAF, and sinus bradycardia. PMH includes vertigo and has undergone PT (2016). He has had several syncopal episodes and increasing falls at home recently. The patient now has persistent atrial fib and slow heart rates. He has a lack of energy. He is also bothered by peripheral neuropathy. Pt now s/p PPM implantation.    Clinical Impression   PTA Pt was mod I for ADL and mobility at home with [facility]. Pt has macular degeneration, increased falls recently (once in the bathroom). Pt sharing that he is having dizziness again with head turns to the right (did not assess BPPV this session). Currently Pt is mod A +2 assist to perform sit <>stand transfer with heavy posterior lean requiring physical assist to maintain static standing (and Pt is unable to self-correct at this time). There is concern about Pt maintaining LUE in regards to post-op pacemaker precautions. Pt is highly motivated, good family support (already working on 24 hour caregivers), and very motivated to return home. A short stay at a CIR level with increased levels of therapy to challenge and improve balance, ADL compensatory strategies, activity tolerance, and comfort with DME would maximize safety and independence in ADL and functional transfers. Next session, please take pacemaker handout and focus on standing balance in conjunction with sink level grooming. This patient definitely requires post-acute OT.  Of Note: After the session, the son came out and spoke with the therapist about post-acute options and stated "His (the Pt's) problem is that he's ready to die, and he needs to get out of that mind frame" Family seems to need more education on DNR status, and setting goals of care with the patient being able to control his own  wants/needs. A Palliative consult seems appropriate to honor and respect the Patient.     Follow Up Recommendations  CIR;Supervision/Assistance - 24 hour    Equipment Recommendations  3 in 1 bedside commode;Other (comment)(defer to next venue)    Recommendations for Other Services Rehab consult;Other (comment)(Palliative Medicine)     Precautions / Restrictions Precautions Precautions: Fall;ICD/Pacemaker Precaution Comments: multiple falls recently Required Braces or Orthoses: Sling Restrictions Weight Bearing Restrictions: Yes LUE Weight Bearing: Non weight bearing      Mobility Bed Mobility Overal bed mobility: Needs Assistance Bed Mobility: Supine to Sit;Sit to Supine     Supine to sit: Mod assist;+2 for physical assistance;+2 for safety/equipment;HOB elevated Sit to supine: Mod assist;+2 for physical assistance;+2 for safety/equipment;HOB elevated   General bed mobility comments: 2 person assist due to pt follow through on instructions being slow and concerns for restrictions of pacemaker surgery  Transfers Overall transfer level: Needs assistance Equipment used: 1 person hand held assist;Straight cane Transfers: Sit to/from Stand Sit to Stand: Mod assist;+2 physical assistance;+2 safety/equipment;From elevated surface         General transfer comment: pt is slow to respond to cues from possibly hearing and cognition    Balance Overall balance assessment: History of Falls;Needs assistance Sitting-balance support: Feet supported;Bilateral upper extremity supported Sitting balance-Leahy Scale: Fair     Standing balance support: Single extremity supported;During functional activity Standing balance-Leahy Scale: Poor Standing balance comment: dependent on therapist/external support                           ADL  either performed or assessed with clinical judgement   ADL Overall ADL's : Needs assistance/impaired Eating/Feeding: Set up;Sitting    Grooming: Set up;Sitting Grooming Details (indicate cue type and reason): unable to maintain static standing without assist for balance Upper Body Bathing: Minimal assistance;Sitting   Lower Body Bathing: Minimal assistance;Sitting/lateral leans   Upper Body Dressing : Moderate assistance;Cueing for UE precautions Upper Body Dressing Details (indicate cue type and reason): to don sling, and for pacemaker precautions Lower Body Dressing: Moderate assistance;+2 for physical assistance;+2 for safety/equipment;Sit to/from stand   Toilet Transfer: Moderate assistance;+2 for physical assistance;+2 for safety/equipment;Stand-pivot Toilet Transfer Details (indicate cue type and reason): simulated through side steps up the bed         Functional mobility during ADLs: Moderate assistance;+2 for physical assistance;+2 for safety/equipment(2 person HHA) General ADL Comments: Pt with decreased safety awareness, vision problems, decreased balance, concern for maintaining LUE precautions     Vision Baseline Vision/History: Macular Degeneration       Perception     Praxis      Pertinent Vitals/Pain Pain Assessment: 0-10 Pain Score: 2  Pain Location: incision site Pain Descriptors / Indicators: Operative site guarding Pain Intervention(s): Monitored during session;Repositioned     Hand Dominance Right   Extremity/Trunk Assessment Upper Extremity Assessment Upper Extremity Assessment: LUE deficits/detail LUE Deficits / Details: per pacemaker protocol LUE Coordination: decreased gross motor   Lower Extremity Assessment Lower Extremity Assessment: Generalized weakness(noted edema in BLE)   Cervical / Trunk Assessment Cervical / Trunk Assessment: Kyphotic   Communication Communication Communication: HOH   Cognition Arousal/Alertness: Awake/alert Behavior During Therapy: WFL for tasks assessed/performed Overall Cognitive Status: Difficult to assess                                  General Comments: pt is not very verbal and waits to respond to questions, some communication delay   General Comments  VSS, although Pt is complaining of dizziness to the right (he claims it has caused at least 2 falls) After the session was over the son came to me in the hallway about how his Dad is always saying "I'm ready to die" according to the son, His father spoke with his PCP about being ready to die. Pt is a full code, I think that a Palliative medicine consult is warranted so that the PATIENTS wishes can be respected for his goals for plan of care    Exercises Exercises: Other exercises(LE strength is 4- throughout)   Shoulder Instructions      Home Living Family/patient expects to be discharged to:: Private residence Living Arrangements: Alone(excellent support- they are knowledgeable about 24 hour care) Available Help at Discharge: Family;Friend(s);Available 24 hours/day Type of Home: House Home Access: Stairs to enter [employer] of Steps: 3 Entrance Stairs-Rails: Right Home Layout: Multi-level;Laundry or work area in basement;Able to live on main level with bedroom/bathroom Alternate Therapist, sports of Steps: flight Alternate Level Stairs-Rails: Right;Left;Can reach both Bathroom Shower/Tub: Tub/shower unit;Curtain   Bathroom Toilet: Handicapped height     Home Equipment: Tripp - single point;Bedside commode;Shower seat;Grab bars - tub/shower   Additional Comments: Pt's son and daughter in law very active and helpful      Prior Functioning/Environment Level of Independence: Independent with assistive device(s)        Comments: uses SPC        OT Problem List: Decreased strength;Decreased range of motion;Decreased activity tolerance;Impaired balance (  sitting and/or standing);Impaired vision/perception;Decreased cognition;Decreased safety awareness;Decreased knowledge of use of DME or AE;Decreased knowledge of precautions;Impaired UE  functional use;Pain;Increased edema      OT Treatment/Interventions: Self-care/ADL training;Therapeutic exercise;DME and/or AE instruction;Energy conservation;Therapeutic activities;Patient/family education;Balance training;Visual/perceptual remediation/compensation    OT Goals(Current goals can be found in the care plan section) Acute Rehab OT Goals Patient Stated Goal: to get home and walk OT Goal Formulation: With patient/family Time For Goal Achievement: 12/19/18 Potential to Achieve Goals: Good ADL Goals Pt Will Perform Grooming: with supervision;standing Pt Will Perform Upper Body Dressing: with modified independence;sitting Pt Will Perform Lower Body Dressing: with supervision;sit to/from stand Pt Will Transfer to Toilet: with supervision;ambulating Pt Will Perform Toileting - Clothing Manipulation and hygiene: with set-up;sitting/lateral leans Pt Will Perform Tub/Shower Transfer: Tub transfer;with min assist;ambulating;grab bars  OT Frequency: Min 3X/week   Barriers to D/C:    family is very supportive and is working on 24 hour paid caregivers to have with him at discharge.        Co-evaluation PT/OT/SLP Co-Evaluation/Treatment: Yes Reason for Co-Treatment: Complexity of the patient's impairments (multi-system involvement);To address functional/ADL transfers;For patient/therapist safety PT goals addressed during session: Mobility/safety with mobility;Balance;Proper use of DME OT goals addressed during session: ADL's and self-care;Proper use of Adaptive equipment and DME      AM-PAC OT "6 Clicks" Daily Activity     Outcome Measure Help from another person eating meals?: A Little Help from another person taking care of personal grooming?: A Little Help from another person toileting, which includes using toliet, bedpan, or urinal?: A Lot Help from another person bathing (including washing, rinsing, drying)?: A Lot Help from another person to put on and taking off regular  upper body clothing?: A Lot Help from another person to put on and taking off regular lower body clothing?: A Lot 6 Click Score: 14   End of Session Equipment Utilized During Treatment: Gait belt(SPC) Nurse Communication: Mobility status;Weight bearing status  Activity Tolerance: Patient tolerated treatment well Patient left: in bed;with call bell/phone within reach;with bed alarm set;with family/visitor present  OT Visit Diagnosis: Unsteadiness on feet (R26.81);Other abnormalities of gait and mobility (R26.89);Repeated falls (R29.6);History of falling (Z91.81);Other symptoms and signs involving cognitive function                Time: 1012-1052 OT Time Calculation (min): 40 min Charges:  OT General Charges $OT Visit: 1 Visit OT Evaluation $OT Eval Moderate Complexity: [place] OTR/L Acute Rehabilitation Services Pager: [phone] Office: [place] 12/05/2018, 3:09 PM

## 2018-12-05 NOTE — Care Management Note (Addendum)
Case Management Note  Patient Details  Name: Douglas Walker MRN: [id number] Date of Birth: Oct 23, 1925  Subjective/Objective:                    Action/Plan:  Patient transferred from [facility] with DC order to home and supportive documentation in MD notes. PT OT evals rec CIR, discussed with RN and family that patient does not qualify for CIR or SNF. Patient's daughter in law stated that if she knew that at 11:00 this morning she would have made arrangements for additional help at home and taken him home at that time. Son and daughter in law state that prior to admission he was walking with a cane, he lived alone and they would be able to help at DC, and he would be staying with them. They were concerned that he is not able to stand on his own.  Spoke to UR RN to see if patient could switch to Inpatient, she confirmed that patient is appropriate in current level of OIB.  Discussed status and DC with Kerin Ransom. Requested [place] orders. Referral made to [facility] states that RN will be able to see patient tomorrow. Tommi Rumps will be in contact with family tonight.  Updated family at bedside that [facility] was accepted into [facility] and [facility] care would start in morning along with HHA help for several hours during the day. They are now refusing to leave.    Expected Discharge Date:  12/05/18               Expected Discharge Plan:  [place]  In-House Referral:     Discharge planning Services  CM Consult  Post Acute Care Choice:  Home Health Choice offered to:  Adult Children  DME Arranged:    DME Agency:     HH Arranged:  RN, PT, Nurse's Aide, Social Work [employer] Agency:  [facility])  Status of Service:  Completed, signed off  If discussed at [employer], dates discussed:    Additional Comments:  Carles Collet, RN 12/05/2018, 4:12 PM

## 2018-12-06 DIAGNOSIS — Z87891 Personal history of nicotine dependence: Secondary | ICD-10-CM | POA: Diagnosis not present

## 2018-12-06 DIAGNOSIS — Z7401 Bed confinement status: Secondary | ICD-10-CM | POA: Diagnosis not present

## 2018-12-06 DIAGNOSIS — I442 Atrioventricular block, complete: Secondary | ICD-10-CM | POA: Diagnosis present

## 2018-12-06 DIAGNOSIS — I495 Sick sinus syndrome: Secondary | ICD-10-CM | POA: Diagnosis present

## 2018-12-06 DIAGNOSIS — G8929 Other chronic pain: Secondary | ICD-10-CM | POA: Diagnosis present

## 2018-12-06 DIAGNOSIS — G250 Essential tremor: Secondary | ICD-10-CM | POA: Diagnosis present

## 2018-12-06 DIAGNOSIS — G629 Polyneuropathy, unspecified: Secondary | ICD-10-CM | POA: Diagnosis present

## 2018-12-06 DIAGNOSIS — Z79890 Hormone replacement therapy (postmenopausal): Secondary | ICD-10-CM | POA: Diagnosis not present

## 2018-12-06 DIAGNOSIS — M545 Low back pain: Secondary | ICD-10-CM | POA: Diagnosis present

## 2018-12-06 DIAGNOSIS — R296 Repeated falls: Secondary | ICD-10-CM | POA: Diagnosis present

## 2018-12-06 DIAGNOSIS — I4819 Other persistent atrial fibrillation: Secondary | ICD-10-CM | POA: Diagnosis present

## 2018-12-06 DIAGNOSIS — H3530 Unspecified macular degeneration: Secondary | ICD-10-CM | POA: Diagnosis present

## 2018-12-06 DIAGNOSIS — R55 Syncope and collapse: Secondary | ICD-10-CM | POA: Diagnosis present

## 2018-12-06 DIAGNOSIS — I2510 Atherosclerotic heart disease of native coronary artery without angina pectoris: Secondary | ICD-10-CM | POA: Diagnosis present

## 2018-12-06 DIAGNOSIS — R531 Weakness: Secondary | ICD-10-CM | POA: Diagnosis not present

## 2018-12-06 DIAGNOSIS — Z79899 Other long term (current) drug therapy: Secondary | ICD-10-CM | POA: Diagnosis not present

## 2018-12-06 DIAGNOSIS — Z7901 Long term (current) use of anticoagulants: Secondary | ICD-10-CM | POA: Diagnosis not present

## 2018-12-06 DIAGNOSIS — Z882 Allergy status to sulfonamides status: Secondary | ICD-10-CM | POA: Diagnosis not present

## 2018-12-06 DIAGNOSIS — I480 Paroxysmal atrial fibrillation: Secondary | ICD-10-CM | POA: Diagnosis not present

## 2018-12-06 DIAGNOSIS — K219 Gastro-esophageal reflux disease without esophagitis: Secondary | ICD-10-CM | POA: Diagnosis present

## 2018-12-06 DIAGNOSIS — E785 Hyperlipidemia, unspecified: Secondary | ICD-10-CM | POA: Diagnosis present

## 2018-12-06 DIAGNOSIS — E039 Hypothyroidism, unspecified: Secondary | ICD-10-CM | POA: Diagnosis not present

## 2018-12-06 DIAGNOSIS — Z950 Presence of cardiac pacemaker: Secondary | ICD-10-CM | POA: Diagnosis not present

## 2018-12-06 DIAGNOSIS — M2550 Pain in unspecified joint: Secondary | ICD-10-CM | POA: Diagnosis not present

## 2018-12-06 DIAGNOSIS — R001 Bradycardia, unspecified: Secondary | ICD-10-CM | POA: Diagnosis not present

## 2018-12-06 DIAGNOSIS — Z8673 Personal history of transient ischemic attack (TIA), and cerebral infarction without residual deficits: Secondary | ICD-10-CM | POA: Diagnosis not present

## 2018-12-06 DIAGNOSIS — I10 Essential (primary) hypertension: Secondary | ICD-10-CM | POA: Diagnosis present

## 2018-12-06 DIAGNOSIS — R251 Tremor, unspecified: Secondary | ICD-10-CM | POA: Diagnosis not present

## 2018-12-06 LAB — PROTIME-INR
INR: 1.2
Prothrombin Time: 15.1 seconds (ref 11.4–15.2)

## 2018-12-06 MED ORDER — WARFARIN SODIUM 5 MG PO TABS
5.0000 mg | ORAL_TABLET | Freq: Every day | ORAL | Status: AC
  Administered 2018-12-06: 5 mg via ORAL
  Filled 2018-12-06: qty 1

## 2018-12-06 NOTE — Plan of Care (Signed)
Monitor surgical site for s/s infection or other complications.

## 2018-12-06 NOTE — Plan of Care (Signed)
  Problem: Infection: Goal: Knowledge of Plan of Care will increase Outcome: Progressing

## 2018-12-06 NOTE — Progress Notes (Addendum)
   Progress Note   Subjective   Patient deconditioned and unsteady yesterday.  PT/OT recommended rehab.  Family does not feel that he can thrive independently without additional assistance.  Inpatient Medications    Scheduled Meds: . DULoxetine  20 mg Oral BID  . furosemide  60 mg Oral Daily  . levothyroxine  25 mcg Oral QAC breakfast  . losartan  50 mg Oral Daily  . multivitamin  1 tablet Oral Daily  . primidone  250 mg Oral BID  . tamsulosin  0.4 mg Oral QPM  . Warfarin - Physician Dosing Inpatient   Does not apply q1800   Continuous Infusions:  PRN Meds: acetaminophen, acetaminophen, meclizine, ondansetron (ZOFRAN) IV   Vital Signs    Vitals:   12/05/18 1521 12/05/18 1800 12/05/18 1949 12/06/18 0632  BP: 130/67  123/63 (!) 142/65  Pulse: 70  70 70  Resp:  19 15 (!) 21  Temp: 98.6 F (37 C)  98.8 F (37.1 C) 98.5 F (36.9 C)  TempSrc: Oral  Oral Oral  SpO2: 96%  96% 91%  Weight:    92 kg  Height:        Intake/Output Summary (Last 24 hours) at 12/06/2018 0827 Last data filed at 12/05/2018 1700 Gross per 24 hour  Intake -  Output 600 ml  Net -600 ml   Filed Weights   12/04/18 0924 12/05/18 0304 12/06/18 5056  Weight: 95.3 kg 96.5 kg 92 kg    Telemetry    V paced - Personally Reviewed  Physical Exam   GEN- The patient is elderly and frail appearing, alert and oriented x 3 today.   Head- normocephalic, atraumatic Eyes-  Sclera clear, conjunctiva pink Ears- hearing intact Oropharynx- clear Neck- supple, Lungs- Clear to ausculation bilaterally, normal work of breathing Heart- Regular rate and rhythm  GI- soft, NT, ND, + BS Extremities- no clubbing, cyanosis, + dependant edema  MS- diffuse atrophy Skin- no rash or lesion Psych- euthymic mood, full affect Neuro- strength and sensation are intact   Labs    Chemistry Recent Labs  Lab 12/02/18 1017  NA 142  K 4.3  CL 105  CO2 23  GLUCOSE 117*  BUN 29  CREATININE 1.45*  CALCIUM 9.1    GFRNONAA 42*  GFRAA 48*     Hematology Recent Labs  Lab 12/02/18 1017  WBC 4.4  RBC 3.40*  HGB 11.2*  HCT 33.8*  MCV 99*  MCH 32.9  MCHC 33.1  RDW 14.6  PLT 94*    Cardiac EnzymesNo results for input(s): TROPONINI in the last 168 hours. No results for input(s): TROPIPOC in the last 168 hours.      Assessment & Plan    1.  Deconditioning, unsteadiness, fall risks Family has requested consideration of rehab PT/OT also suggests rehab.  We will explore this option. Hopefully he will qualify for SNF.  If he does not then we will try to make sure that he has everything that he needs to live safely at home.  2. afib On coumadin Rate controlled Will need outpatient INR arranged prior to discharge  3. CHB/ SSS S/p pacemaker implant by Dr Lovena Le on Friday Stable No change required today  4. CAD No ischemic symptoms   Thompson Grayer MD, [facility] 12/06/2018 8:27 AM  ERROR

## 2018-12-06 NOTE — Progress Notes (Signed)
Inpatient Rehabilitation-Admissions Coordinator   Noted pt status has changed to inpatient. If pt and family would like to have pt be considered for CIR, please have MD place an [place].    Please call if questions .  Jhonnie Garner, OTR/L  Rehab Admissions Coordinator  [phone] 12/06/2018 8:57 AM

## 2018-12-07 ENCOUNTER — Encounter (HOSPITAL_COMMUNITY): Payer: Self-pay | Admitting: Internal Medicine

## 2018-12-07 DIAGNOSIS — D696 Thrombocytopenia, unspecified: Secondary | ICD-10-CM | POA: Diagnosis not present

## 2018-12-07 DIAGNOSIS — H3530 Unspecified macular degeneration: Secondary | ICD-10-CM | POA: Diagnosis not present

## 2018-12-07 DIAGNOSIS — Z7401 Bed confinement status: Secondary | ICD-10-CM | POA: Diagnosis not present

## 2018-12-07 DIAGNOSIS — Z95810 Presence of automatic (implantable) cardiac defibrillator: Secondary | ICD-10-CM | POA: Diagnosis not present

## 2018-12-07 DIAGNOSIS — M6281 Muscle weakness (generalized): Secondary | ICD-10-CM | POA: Diagnosis not present

## 2018-12-07 DIAGNOSIS — R001 Bradycardia, unspecified: Secondary | ICD-10-CM | POA: Diagnosis not present

## 2018-12-07 DIAGNOSIS — R251 Tremor, unspecified: Secondary | ICD-10-CM | POA: Diagnosis not present

## 2018-12-07 DIAGNOSIS — M2550 Pain in unspecified joint: Secondary | ICD-10-CM | POA: Diagnosis not present

## 2018-12-07 DIAGNOSIS — R296 Repeated falls: Secondary | ICD-10-CM | POA: Diagnosis not present

## 2018-12-07 DIAGNOSIS — R000 Tachycardia, unspecified: Secondary | ICD-10-CM | POA: Diagnosis not present

## 2018-12-07 DIAGNOSIS — R55 Syncope and collapse: Secondary | ICD-10-CM | POA: Diagnosis not present

## 2018-12-07 DIAGNOSIS — R41841 Cognitive communication deficit: Secondary | ICD-10-CM | POA: Diagnosis not present

## 2018-12-07 DIAGNOSIS — I2510 Atherosclerotic heart disease of native coronary artery without angina pectoris: Secondary | ICD-10-CM | POA: Diagnosis not present

## 2018-12-07 DIAGNOSIS — R531 Weakness: Secondary | ICD-10-CM | POA: Diagnosis not present

## 2018-12-07 DIAGNOSIS — I495 Sick sinus syndrome: Secondary | ICD-10-CM | POA: Diagnosis not present

## 2018-12-07 DIAGNOSIS — I442 Atrioventricular block, complete: Secondary | ICD-10-CM | POA: Diagnosis not present

## 2018-12-07 DIAGNOSIS — F329 Major depressive disorder, single episode, unspecified: Secondary | ICD-10-CM | POA: Diagnosis not present

## 2018-12-07 DIAGNOSIS — G629 Polyneuropathy, unspecified: Secondary | ICD-10-CM | POA: Diagnosis not present

## 2018-12-07 DIAGNOSIS — H8110 Benign paroxysmal vertigo, unspecified ear: Secondary | ICD-10-CM | POA: Diagnosis not present

## 2018-12-07 DIAGNOSIS — N400 Enlarged prostate without lower urinary tract symptoms: Secondary | ICD-10-CM | POA: Diagnosis not present

## 2018-12-07 DIAGNOSIS — I480 Paroxysmal atrial fibrillation: Secondary | ICD-10-CM | POA: Diagnosis not present

## 2018-12-07 DIAGNOSIS — M48061 Spinal stenosis, lumbar region without neurogenic claudication: Secondary | ICD-10-CM | POA: Diagnosis not present

## 2018-12-07 DIAGNOSIS — I10 Essential (primary) hypertension: Secondary | ICD-10-CM | POA: Diagnosis not present

## 2018-12-07 DIAGNOSIS — E039 Hypothyroidism, unspecified: Secondary | ICD-10-CM | POA: Diagnosis not present

## 2018-12-07 DIAGNOSIS — I4819 Other persistent atrial fibrillation: Secondary | ICD-10-CM | POA: Diagnosis not present

## 2018-12-07 DIAGNOSIS — Z9181 History of falling: Secondary | ICD-10-CM | POA: Diagnosis not present

## 2018-12-07 DIAGNOSIS — W19XXXA Unspecified fall, initial encounter: Secondary | ICD-10-CM | POA: Diagnosis not present

## 2018-12-07 DIAGNOSIS — Z950 Presence of cardiac pacemaker: Secondary | ICD-10-CM | POA: Diagnosis not present

## 2018-12-07 DIAGNOSIS — M4800 Spinal stenosis, site unspecified: Secondary | ICD-10-CM | POA: Diagnosis not present

## 2018-12-07 DIAGNOSIS — M6389 Disorders of muscle in diseases classified elsewhere, multiple sites: Secondary | ICD-10-CM | POA: Diagnosis not present

## 2018-12-07 LAB — PROTIME-INR
INR: 1.17
Prothrombin Time: 14.8 seconds (ref 11.4–15.2)

## 2018-12-07 MED ORDER — TOPIRAMATE 25 MG PO TABS
50.0000 mg | ORAL_TABLET | Freq: Two times a day (BID) | ORAL | Status: DC
  Administered 2018-12-07: 50 mg via ORAL
  Filled 2018-12-07 (×2): qty 2

## 2018-12-07 MED ORDER — WARFARIN SODIUM 10 MG PO TABS: 10.0000 mg | ORAL_TABLET | Freq: Once | ORAL | Status: DC

## 2018-12-07 MED FILL — Lidocaine HCl Local Inj 1%: INTRAMUSCULAR | Qty: 60 | Status: AC

## 2018-12-07 MED FILL — Heparin Sod (Porcine)-NaCl IV Soln 1000 Unit/500ML-0.9%: INTRAVENOUS | Qty: 500 | Status: AC

## 2018-12-07 NOTE — Progress Notes (Addendum)
Patient is approved through [facility] for Medicare 3 day waiver for SNF. Case ID # [id number].   Patient will discharge to [place] Anticipated discharge date: 12/07/18 Family notified: Jabez Molner, son Transportation by: PTAR  Nurse to call report to [phone]. Patient will go to room 206 at the facility.  CSW signing off.  Estanislado Emms, [place]  Clinical Social Worker

## 2018-12-07 NOTE — Progress Notes (Signed)
Physical Therapy Treatment Patient Details Name: Douglas Walker MRN: [id number] DOB: 07-06-1925 Today's Date: 12/07/2018    History of Present Illness Pt is a 84 yo man with HTN, macular degeneration, PAF, and sinus bradycardia. PMH includes vertigo and has undergone PT (2016). He has had several syncopal episodes and increasing falls at home recently. The patient now has persistent atrial fib and slow heart rates. He has a lack of energy. He is also bothered by peripheral neuropathy. Pt now s/p PPM implantation.     PT Comments    Patient seen for activity progression. Tolerated session well with increased ambulation distance. Continues to require hands on physical assist for all aspects of mobility and task performance. Current POC remains appropriate, patient will need post acute rehabilitation recommend SNF setting. Will continue to see as indicated and progress as tolerated.    Follow Up Recommendations  SNF;Supervision/Assistance - 24 hour     Equipment Recommendations  None recommended by PT    Recommendations for Other Services Rehab consult     Precautions / Restrictions Precautions Precautions: Fall;ICD/Pacemaker Precaution Comments: multiple falls recently Required Braces or Orthoses: Sling Restrictions Weight Bearing Restrictions: Yes LUE Weight Bearing: Weight bearing as tolerated Other Position/Activity Restrictions: no flexion beyond 90degrees    Mobility  Bed Mobility               General bed mobility comments: received sitting EOB supervision   Transfers Overall transfer level: Needs assistance Equipment used: 1 person hand held assist;Straight cane Transfers: Sit to/from Stand Sit to Stand: +2 physical assistance;Min assist         General transfer comment: +2 min assist to power up to standing with noted instability   Ambulation/Gait Ambulation/Gait assistance: Min assist;+2 physical assistance;+2 safety/equipment Gait Distance (Feet):  70 Feet(3 standing rest breaks) Assistive device: 1 person hand held assist;Straight cane Gait Pattern/deviations: Step-to pattern;Decreased stride length;Wide base of support;Ataxic;Shuffle;Decreased weight shift to left Gait velocity: decreased(lists against bed to control backward LOB) Gait velocity interpretation: <1.31 ft/sec, indicative of household ambulator General Gait Details: Patient with difficulty initiating stride requiring cues for pacing and performance. Bilateral physical assist with min assist and multiple incidens requiring moderate assist to prevent complete LOB. right lateral list during standing and mobility   Stairs             Wheelchair Mobility    Modified Rankin (Stroke Patients Only)       Balance Overall balance assessment: History of Falls;Needs assistance Sitting-balance support: Feet supported;Bilateral upper extremity supported Sitting balance-Leahy Scale: Fair   Postural control: Right lateral lean Standing balance support: During functional activity Standing balance-Leahy Scale: Poor Standing balance comment: right lateral list, reliance on external assist. [place] resting against counter surface                            Cognition Arousal/Alertness: Awake/alert Behavior During Therapy: WFL for tasks assessed/performed                                          Exercises      General Comments General comments (skin integrity, edema, etc.): VSS throughout session but patient did endorse multiple bouts of dizziness with activity      Pertinent Vitals/Pain Pain Assessment: Faces Faces Pain Scale: Hurts a little bit Pain Intervention(s): Monitored during session  Home Living                      Prior Function            PT Goals (current goals can now be found in the care plan section) Acute Rehab PT Goals Patient Stated Goal: to get better and walk PT Goal Formulation: With  patient/family Time For Goal Achievement: 12/19/18 Potential to Achieve Goals: Good Progress towards PT goals: Progressing toward goals    Frequency    Min 2X/week      PT Plan Current plan remains appropriate    Co-evaluation PT/OT/SLP Co-Evaluation/Treatment: Yes Reason for Co-Treatment: Complexity of the patient's impairments (multi-system involvement) PT goals addressed during session: Mobility/safety with mobility OT goals addressed during session: ADL's and self-care      AM-PAC PT "6 Clicks" Mobility   Outcome Measure  Help needed turning from your back to your side while in a flat bed without using bedrails?: A Little Help needed moving from lying on your back to sitting on the side of a flat bed without using bedrails?: A Lot Help needed moving to and from a bed to a chair (including a wheelchair)?: A Lot Help needed standing up from a chair using your arms (e.g., wheelchair or bedside chair)?: A Lot Help needed to walk in hospital room?: A Lot Help needed climbing 3-5 steps with a railing? : Total 6 Click Score: 12    End of Session Equipment Utilized During Treatment: Gait belt Activity Tolerance: Patient tolerated treatment well Patient left: in chair;with call bell/phone within reach;with chair alarm set;with family/visitor present Nurse Communication: Mobility status PT Visit Diagnosis: Unsteadiness on feet (R26.81);Muscle weakness (generalized) (M62.81);Repeated falls (R29.6);Ataxic gait (R26.0);Difficulty in walking, not elsewhere classified (R26.2);Adult, failure to thrive (R62.7);Pain Pain - Right/Left: Left Pain - part of body: Shoulder     Time: 6063-0160 PT Time Calculation (min) (ACUTE ONLY): 27 min  Charges:  $Gait Training: 8-22 mins                     Alben Deeds, PT DPT  Board Certified Neurologic Specialist [place] Pager [phone] Office [phone]    Duncan Dull 12/07/2018, 11:05 AM

## 2018-12-07 NOTE — Clinical Social Work Placement (Signed)
   CLINICAL SOCIAL WORK PLACEMENT  NOTE  Date:  12/07/2018  Patient Details  Name: Douglas Walker MRN: [id number] Date of Birth: [birth year]  Clinical Social Work is seeking post-discharge placement for this patient at the [place] level of care (*CSW will initial, date and re-position this form in  chart as items are completed):  Yes   Patient/family provided with [place] Work Department's list of facilities offering this level of care within the geographic area requested by the patient (or if unable, by the patient's family).  Yes   Patient/family informed of their freedom to choose among providers that offer the needed level of care, that participate in Medicare, Medicaid or managed care program needed by the patient, have an available bed and are willing to accept the patient.  Yes   Patient/family informed of [facility]'s ownership interest in [facility] and [facility], as well as of the fact that they are under no obligation to receive care at these facilities.  PASRR submitted to EDS on 12/07/18     PASRR number received on 12/07/18     Existing PASRR number confirmed on       FL2 transmitted to all facilities in geographic area requested by pt/family on 12/07/18     FL2 transmitted to all facilities within larger geographic area on       Patient informed that his/her managed care company has contracts with or will negotiate with certain facilities, including the following:  [facility], [place]     Yes   Patient/family informed of bed offers received.  Patient chooses bed at [place], [place]     Physician recommends and patient chooses bed at      Patient to be transferred to [place] on 12/07/18.  Patient to be transferred to facility by PTAR     Patient family notified on 12/07/18 of transfer.  Name of family member notified:  Ashad Fawbush, son     PHYSICIAN Please prepare priority  discharge summary, including medications, Please prepare prescriptions, Please sign FL2     Additional Comment:    _______________________________________________ Estanislado Emms, LCSW 12/07/2018, 2:40 PM

## 2018-12-07 NOTE — Progress Notes (Signed)
Report given to a nurse at [facility].  Milicent Acheampong, RN 

## 2018-12-07 NOTE — Progress Notes (Signed)
ANTICOAGULATION CONSULT NOTE - Follow Up Consult  Pharmacy Consult for Coumadin Indication: atrial fibrillation  Allergies  Allergen Reactions  . Sulfa Antibiotics Other (See Comments)    Weakness, dizziness    Patient Measurements: Height: 5\' 9"  (175.3 cm) Weight: 201 lb 6.4 oz (91.4 kg) IBW/kg (Calculated) : 70.7  Vital Signs: Temp: 98.6 F (37 C) (12/09 0444) Temp Source: Oral (12/09 0444) BP: 144/71 (12/09 0444) Pulse Rate: 69 (12/09 0444)  Labs: Recent Labs    12/06/18 0908 12/07/18 0431  LABPROT 15.1 14.8  INR 1.20 1.17    Estimated Creatinine Clearance: 37.1 mL/min (A) (by C-G formula based on SCr of 1.45 mg/dL (H)).   Assessment: Anticoag: Coumadin PTA for PAF. Cbc ok on 12/4. INR 1.17. Rx to manage. - PTA dose, 5mg  Mon, 7.5mg  all other days.  Goal of Therapy:  INR 2-3 Monitor platelets by anticoagulation protocol: Yes   Plan:  -Warfarin increase to 10mg  po x 1 today per pharmacy - Daily INR - Discharge when SNF available  Jorgen Wolfinger S. Alford Highland, PharmD, BCPS Clinical Staff Pharmacist Eilene Ghazi Stillinger 12/07/2018,10:25 AM

## 2018-12-07 NOTE — Progress Notes (Signed)
Progress Note  Patient Name: Douglas Walker Date of Encounter: 12/07/2018  Primary Cardiologist: Douglas Grooms, Walker   Subjective   "I am weak when I try to get up but then I'm ok." No chest pain or sob. Minimal incisional pain.   Inpatient Medications    Scheduled Meds: . DULoxetine  20 mg Oral BID  . furosemide  60 mg Oral Daily  . levothyroxine  25 mcg Oral QAC breakfast  . losartan  50 mg Oral Daily  . multivitamin  1 tablet Oral Daily  . primidone  250 mg Oral BID  . tamsulosin  0.4 mg Oral QPM  . Warfarin - Physician Dosing Inpatient   Does not apply q1800   Continuous Infusions:  PRN Meds: acetaminophen, acetaminophen, meclizine, ondansetron (ZOFRAN) IV   Vital Signs    Vitals:   12/06/18 1500 12/06/18 2027 12/07/18 0143 12/07/18 0444  BP: (!) 144/75 133/64  (!) 144/71  Pulse:  70  69  Resp:  20 19 15   Temp: 98.7 F (37.1 C) 98.8 F (37.1 C)  98.6 F (37 C)  TempSrc: Oral Oral  Oral  SpO2: 96% 95%  90%  Weight:    91.4 kg  Height:        Intake/Output Summary (Last 24 hours) at 12/07/2018 0843 Last data filed at 12/06/2018 1846 Gross per 24 hour  Intake -  Output 1930 ml  Net -1930 ml   Filed Weights   12/05/18 0304 12/06/18 0632 12/07/18 0444  Weight: 96.5 kg 92 kg 91.4 kg    Telemetry    Atrial fib with ventricular pacing - Personally Reviewed  ECG    none - Personally Reviewed  Physical Exam   GEN: elderly man, no acute distress.   Neck: 6 cm JVD Cardiac: RRR, no murmurs, rubs, or gallops.  Respiratory: Clear to auscultation bilaterally. GI: Soft, nontender, non-distended  MS: trace edema; No deformity. Neuro:  Nonfocal  Psych: Normal affect   Labs    Chemistry Recent Labs  Lab 12/02/18 1017  NA 142  K 4.3  CL 105  CO2 23  GLUCOSE 117*  BUN 29  CREATININE 1.45*  CALCIUM 9.1  GFRNONAA 42*  GFRAA 48*     Hematology Recent Labs  Lab 12/02/18 1017  WBC 4.4  RBC 3.40*  HGB 11.2*  HCT 33.8*  MCV 99*  MCH  32.9  MCHC 33.1  RDW 14.6  PLT 94*    Cardiac EnzymesNo results for input(s): TROPONINI in the last 168 hours. No results for input(s): TROPIPOC in the last 168 hours.   BNPNo results for input(s): BNP, PROBNP in the last 168 hours.   DDimer No results for input(s): DDIMER in the last 168 hours.   Radiology    No results found.  Cardiac Studies   none  Patient Profile     84 y.o. male admitted for PPM, with weakness, seen by PT and recommended rehab.  Assessment & Plan    1. CHB - he is stable s/p PPM 2. Atrial fib - his rate is well controlled.  3. PPM - his Medtronic device is working normally. 4. Disp. - he is stable for discharge to rehab.   Douglas Walker,M.D.  For questions or updates, please contact Douglas Walker Please consult [email or web address] for contact info under Cardiology/STEMI.      Signed, Douglas Walker  12/07/2018, 8:43 AM  Patient ID: Douglas Walker, male   DOB: 1925-01-19, 84 y.o.  MRN: [id number]

## 2018-12-07 NOTE — Clinical Social Work Note (Signed)
Clinical Social Work Assessment  Patient Details  Name: Douglas Walker MRN: [id number] Date of Birth: 05/20/1925  Date of referral:  12/07/18               Reason for consult:  Facility Placement, Discharge Planning                Permission sought to share information with:  Facility Contact Representative, Family Supports Permission granted to share information::  Yes, Verbal Permission Granted  Name::     [name] Wimberly  Agency::  SNFs  Relationship::  son  Contact Information:  [phone]  Housing/Transportation Living arrangements for the past 2 months:  Single Family Home Source of Information:  Patient, Adult Children Patient Interpreter Needed:  None Criminal Activity/Legal Involvement Pertinent to Current Situation/Hospitalization:  No - Comment as needed Significant Relationships:  Adult Children, Other Family Members Lives with:  Self Do you feel safe going back to the place where you live?  Yes Need for family participation in patient care:  Yes (Comment)  Care giving concerns: Patient from home alone. PT recommending SNF. Patient does not have a qualifying three night stay under Medicare for SNF.   Social Worker assessment / plan: CSW met with patient and family at bedside, including son, [name]. Patient alert and oriented, sitting up on edge of bed preparing to work with OT. CSW introduced self and role and discussed disposition planning.  Patient and son agreeable to SNF. CSW explained Medicare coverage for SNF and the fact that patient did not have a qualifying three night inpatient stay. Patient is a [facility] participant. CSW reviewed [facility] 3-day SNF waiver and provided patient and family with list of facilities that participate in waiver program. Also provided patient and family with list of SNF ratings from CMS. Explained that SNF choice will be based on bed offers.   CSW sent out initial referrals, awaiting bed offers. Patient does not currently have any  offers. Opened case with [facility], but facility must be identified before case ID # is assigned and patient is approved.  CSW to follow and provide bed offers to patient and family when available. Will support with discharge when bed available.  Employment status:  Retired Insurance information:  Medicare PT Recommendations:  Skilled Nursing Facility Information / Referral to community resources:  Skilled Nursing Facility  Patient/Family's Response to care: Patient and son appreciative of care.  Patient/Family's Understanding of and Emotional Response to Diagnosis, Current Treatment, and Prognosis: Patient and son with understanding of patient's condition and recommendation for SNF.  Emotional Assessment Appearance:  Appears stated age Attitude/Demeanor/Rapport:  Engaged Affect (typically observed):  Accepting, Calm, Appropriate Orientation:  Oriented to Self, Oriented to Place, Oriented to  Time, Oriented to Situation Alcohol / Substance use:  Not Applicable Psych involvement (Current and /or in the community):  No (Comment)  Discharge Needs  Concerns to be addressed:  Discharge Planning Concerns, Care Coordination Readmission within the last 30 days:  No Current discharge risk:  Physical Impairment, Lives alone Barriers to Discharge:  No SNF bed, Insurance Authorization    , LCSW 12/07/2018, 11:23 AM  

## 2018-12-07 NOTE — Progress Notes (Signed)
Patient has a bed offer from a [facility] Medicare waiver participating SNF - [place]. However, [facility] rep stating that patient does not need waiver due to being switched to inpatient yesterday. Have left message with [facility] to clarify, as Medicare still will not cover a SNF stay unless patient has been inpatient status for three nights (confirmed this with case management and with [facility] that a waiver is still needed.)  CSW updated patient's son on bed offer. Son is agreeable to [facility].  CSW to follow.  Estanislado Emms, LCSW [phone]

## 2018-12-07 NOTE — Discharge Summary (Addendum)
Please see discharge summary from 12/05/18 and Dr. Tanna Furry full note from today. Initially discharge planned for 12/05/18 though held the weekend for SNF/rehab placement base on PT recommendations and patient/family request.  Dr. Lovena Le has seen and examined the patient today, cleared for discharge when bed /arrangements completed.  I have reached out to case managment to follow up on SNF/rehab acceptance? vs home health, if he did not qualify over the weekend?  Patient is now in-patient status, not sure if that changes this or not.  Tommye Standard, PA-C  EP Attending  Agree with above.   Mikle Bosworth.D.

## 2018-12-07 NOTE — NC FL2 (Signed)
[place] LEVEL OF CARE SCREENING TOOL     IDENTIFICATION  Patient Name: Douglas Walker Birthdate: [birth year] Sex: male Admission Date (Current Location): 12/04/2018  Ambulatory Care Center and [place] Number:  Herbalist and Address:  [facility], [place] [address], [place], [place] [address]      Provider Number: [id number]  Attending Physician Name and Address:  Thompson Grayer, MD  Relative Name and Phone Number:  Rajohn Henery, son, [phone]    Current Level of Care: Hospital Recommended Level of Care: [place] Prior Approval Number:    Date Approved/Denied:   PASRR Number: [id number]  Discharge Plan: SNF    Current Diagnoses: Patient Active Problem List   Diagnosis Date Noted  . Sick sinus syndrome ([place]) 12/06/2018  . Complete heart block ([place]) 12/04/2018  . Chronic low back pain 12/15/2017  . Hereditary and idiopathic peripheral neuropathy 07/01/2016  . Essential tremor 02/28/2016  . BPPV (benign paroxysmal positional vertigo) 06/19/2015  . Dizziness 06/18/2015  . Bacteria in urine 06/18/2015  . CKD (chronic kidney disease) stage 3, GFR 30-59 ml/min: Probable 06/18/2015  . Chronic diastolic CHF (congestive heart failure) ([place]) 05/30/2015  . Fall   . Syncope 05/19/2015  . UTI (lower urinary tract infection) 05/19/2015  . Hypotension 05/19/2015  . Dehydration 05/19/2015  . Fracture of rib of left side 05/19/2015  . Syncope and collapse 05/19/2015  . Mobitz type II atrioventricular block 09/13/2014  . Essential hypertension 09/13/2014  . Chronic anticoagulation 09/13/2014  . Tachycardia-bradycardia syndrome ([place]) 09/13/2014  . Encounter for therapeutic drug monitoring 02/09/2014  . Paroxysmal atrial fibrillation ([place]) 10/28/2013  . Thrombocytopenia ([place]) 04/06/2013    Orientation RESPIRATION BLADDER Height & Weight     Self, Time, Situation, Place  Normal Incontinent, External catheter Weight: 91.4  kg Height:  5\' 9"  (175.3 cm)  BEHAVIORAL SYMPTOMS/MOOD NEUROLOGICAL BOWEL NUTRITION STATUS      Continent Diet(please see DC summary)  AMBULATORY STATUS COMMUNICATION OF NEEDS Skin   Limited Assist Verbally Surgical wounds(closed incision L side chest (pace maker))                       Personal Care Assistance Level of Assistance  Bathing, Feeding, Dressing Bathing Assistance: Limited assistance Feeding assistance: Independent Dressing Assistance: Limited assistance     Functional Limitations Info  Sight, Hearing, Speech Sight Info: Impaired Hearing Info: Impaired Speech Info: Adequate    SPECIAL CARE FACTORS FREQUENCY  PT (By licensed PT), OT (By licensed OT)     PT Frequency: 5x/week OT Frequency: 5x/week            Contractures Contractures Info: Not present    Additional Factors Info  Code Status, Allergies, Psychotropic Code Status Info: DNR Allergies Info: Sulfa Antibiotics Psychotropic Info: cymbalta         Current Medications (12/07/2018):  This is the current hospital active medication list Current Facility-Administered Medications  Medication Dose Route Frequency Provider Last Rate Last Dose  . acetaminophen (TYLENOL) tablet 1,000 mg  1,000 mg Oral Q6H PRN Evans Lance, MD   1,000 mg at 12/05/18 0930  . acetaminophen (TYLENOL) tablet 325-650 mg  325-650 mg Oral Q4H PRN Evans Lance, MD   650 mg at 12/06/18 2228  . DULoxetine (CYMBALTA) DR capsule 20 mg  20 mg Oral BID Evans Lance, MD   20 mg at 12/07/18 0950  . furosemide (LASIX) tablet 60 mg  60 mg Oral  Daily Evans Lance, MD   60 mg at 12/07/18 0947  . levothyroxine (SYNTHROID, LEVOTHROID) tablet 25 mcg  25 mcg Oral QAC breakfast Evans Lance, MD   25 mcg at 12/07/18 0631  . losartan (COZAAR) tablet 50 mg  50 mg Oral Daily Evans Lance, MD   50 mg at 12/07/18 0950  . meclizine (ANTIVERT) tablet 12.5 mg  12.5 mg Oral Once PRN Chriss Czar, MD      . multivitamin (PROSIGHT)  tablet 1 tablet  1 tablet Oral Daily Evans Lance, MD   1 tablet at 12/07/18 0946  . ondansetron (ZOFRAN) injection 4 mg  4 mg Intravenous Q6H PRN Evans Lance, MD      . primidone (MYSOLINE) tablet 250 mg  250 mg Oral BID Evans Lance, MD   250 mg at 12/07/18 0950  . tamsulosin (FLOMAX) capsule 0.4 mg  0.4 mg Oral QPM Evans Lance, MD   0.4 mg at 12/06/18 1815  . Warfarin - Physician Dosing Inpatient   Does not apply q1800 Thompson Grayer, MD         Discharge Medications: Please see discharge summary for a list of discharge medications.  Relevant Imaging Results:  Relevant Lab Results:   Additional Information SSN: [id number]  Estanislado Emms, LCSW

## 2018-12-07 NOTE — Progress Notes (Signed)
Occupational Therapy Treatment Patient Details Name: Douglas Walker MRN: [id number] DOB: 09/05/1925 Today's Date: 12/07/2018    History of present illness Pt is a 84 yo man with HTN, macular degeneration, PAF, and sinus bradycardia. PMH includes vertigo and has undergone PT (2016). He has had several syncopal episodes and increasing falls at home recently. The patient now has persistent atrial fib and slow heart rates. He has a lack of energy. He is also bothered by peripheral neuropathy. Pt now s/p PPM implantation.    OT comments  Pt presents sitting EOB, pleasant and motivated to work with therapy. Pt with improvements in activity tolerance this session, though continues to require consistent external assist during mobility and functional tasks with two person assist provided during mobility today and requiring at least minA for static standing balance during standing grooming ADL. Continued education regarding pacemaker precautions and compensatory techniques for performing ADLs given precautions/UE limitations. Pt verbalizing understanding and will benefit from continued review. Continue to recommend post-acute rehab services prior to discharge home to maximize pt's overall safety and independence with ADLs and mobility. Will continue to follow acutely.    Follow Up Recommendations  SNF;Supervision/Assistance - 24 hour    Equipment Recommendations  3 in 1 bedside commode;Other (comment)(defer to next venue)          Precautions / Restrictions Precautions Precautions: Fall;ICD/Pacemaker Precaution Comments: multiple falls recently Required Braces or Orthoses: Sling Restrictions Weight Bearing Restrictions: Yes LUE Weight Bearing: Weight bearing as tolerated Other Position/Activity Restrictions: no flexion beyond 90degrees       Mobility Bed Mobility               General bed mobility comments: received sitting EOB supervision   Transfers Overall transfer level:  Needs assistance Equipment used: 1 person hand held assist;Straight cane Transfers: Sit to/from Stand Sit to Stand: +2 physical assistance;Min assist         General transfer comment: +2 min assist to power up to standing with noted instability     Balance Overall balance assessment: History of Falls;Needs assistance Sitting-balance support: Feet supported;Bilateral upper extremity supported Sitting balance-Leahy Scale: Fair   Postural control: Right lateral lean Standing balance support: During functional activity Standing balance-Leahy Scale: Poor Standing balance comment: right lateral list, reliance on external assist. [place] resting against counter surface                           ADL either performed or assessed with clinical judgement   ADL Overall ADL's : Needs assistance/impaired     Grooming: Minimal assistance;Wash/dry face;Standing Grooming Details (indicate cue type and reason): +2 safety while standing; minA standing balance with pt bracing himself against the sink         Upper Body Dressing : Moderate assistance;Cueing for UE precautions Upper Body Dressing Details (indicate cue type and reason): reviewed compensatory techniques for UB dressing given pacemaker precautions Lower Body Dressing: Moderate assistance;+2 for physical assistance;+2 for safety/equipment;Sit to/from stand Lower Body Dressing Details (indicate cue type and reason): assisted with adjusting socks this session to maintain LUE precuations             Functional mobility during ADLs: Minimal assistance;+2 for physical assistance;+2 for safety/equipment;Cane General ADL Comments: pt requires consistent assist for mobility as he continues to have decreased stability and fatigue with moving     Vision       Perception     Praxis  Cognition Arousal/Alertness: Awake/alert Behavior During Therapy: WFL for tasks assessed/performed Overall Cognitive Status: Difficult  to assess                                 General Comments: when performing grooming ADLs pt verbalizing he is going to take off his glasses to wash his face, though pt not wearing glasses at this time        Exercises     Shoulder Instructions       General Comments VSS throughout session but patient did endorse multiple bouts of dizziness with activity    Pertinent Vitals/ Pain       Pain Assessment: Faces Faces Pain Scale: Hurts a little bit Pain Location: incision site Pain Descriptors / Indicators: Operative site guarding Pain Intervention(s): Monitored during session;Repositioned  Home Living                                          Prior Functioning/Environment              Frequency  Min 3X/week        Progress Toward Goals  OT Goals(current goals can now be found in the care plan section)  Progress towards OT goals: Progressing toward goals  Acute Rehab OT Goals Patient Stated Goal: to get better and walk OT Goal Formulation: With patient/family Time For Goal Achievement: 12/19/18 Potential to Achieve Goals: Good  Plan Discharge plan needs to be updated    Co-evaluation    PT/OT/SLP Co-Evaluation/Treatment: Yes Reason for Co-Treatment: Complexity of the patient's impairments (multi-system involvement);For patient/therapist safety;Necessary to address cognition/behavior during functional activity;To address functional/ADL transfers PT goals addressed during session: Mobility/safety with mobility OT goals addressed during session: ADL's and self-care;Strengthening/ROM      AM-PAC OT "6 Clicks" Daily Activity     Outcome Measure   Help from another person eating meals?: A Little Help from another person taking care of personal grooming?: A Little Help from another person toileting, which includes using toliet, bedpan, or urinal?: A Lot Help from another person bathing (including washing, rinsing, drying)?: A  Lot Help from another person to put on and taking off regular upper body clothing?: A Lot Help from another person to put on and taking off regular lower body clothing?: A Lot 6 Click Score: 14    End of Session Equipment Utilized During Treatment: Gait belt(SPC)  OT Visit Diagnosis: Unsteadiness on feet (R26.81);Other abnormalities of gait and mobility (R26.89);Repeated falls (R29.6);History of falling (Z91.81);Other symptoms and signs involving cognitive function   Activity Tolerance Patient tolerated treatment well   Patient Left in chair;with call bell/phone within reach;with chair alarm set;with family/visitor present   Nurse Communication Mobility status        Time: 4562-5638 OT Time Calculation (min): 30 min  Charges: OT General Charges $OT Visit: 1 Visit OT Treatments $Self Care/Home Management : 8-22 mins  Lou Cal, OT Supplemental Rehabilitation Services Pager [phone] Office [phone]    Raymondo Band 12/07/2018, 11:13 AM

## 2018-12-08 ENCOUNTER — Telehealth: Payer: Self-pay | Admitting: Pharmacist

## 2018-12-08 ENCOUNTER — Telehealth: Payer: Self-pay | Admitting: Neurology

## 2018-12-08 NOTE — Telephone Encounter (Signed)
Patients son called and stated that he is unsure if he should keep his fathers apt. He states that he doesn't know if it is necessary to keep it, the patient also has another apt that same day at 12 and he is a little concerned about transportation/timing issues. Please call and advise [phone]

## 2018-12-08 NOTE — Telephone Encounter (Signed)
I contacted the patient's son. He stated the patient is currently in [facility] for rehab due to leg weakness. The patient has not been advised yet by [facility] how long his rehabilitation will be and would like to cancel the appt scheduled for 12/17/2018 due to a pacemaker appt on the same day. Patient's appointment has been canceled and [name](pt's son) will cal back to reschedule once rehabilitation plan is recommended.  MB RN

## 2018-12-08 NOTE — Telephone Encounter (Signed)
Pt's son called to report that [facility] is monitoring coumadin therapy while there.

## 2018-12-08 NOTE — Telephone Encounter (Signed)
Son states that [facility] has been giving him different dose and that someone will be calling from the facility to ask about dose. Advised that we can tell them what dose he was on previously, but will defer to them for dose since they have INR results. Also advised that once pt is discharged to call our office so that we can resume management. He states understanding.

## 2018-12-11 DIAGNOSIS — Z9181 History of falling: Secondary | ICD-10-CM | POA: Diagnosis not present

## 2018-12-11 DIAGNOSIS — R55 Syncope and collapse: Secondary | ICD-10-CM | POA: Diagnosis not present

## 2018-12-11 DIAGNOSIS — I480 Paroxysmal atrial fibrillation: Secondary | ICD-10-CM | POA: Diagnosis not present

## 2018-12-11 DIAGNOSIS — I442 Atrioventricular block, complete: Secondary | ICD-10-CM | POA: Diagnosis not present

## 2018-12-15 NOTE — Consult Note (Signed)
            [facility] CM Primary Care Navigator  12/15/2018  Douglas Walker Mar 30, 1925 [id number]   Attempt to see patientat the bedsideto identify possible discharge needs but hewasalreadydischarged.  Per Inpatient CM note, patientwas discharged to skilled nursing facility per therapy recommendation. (SNF-[facility])  Per MD note, patient presented for pacemaker implantation related to intermittent episodes of dizziness over the past few months. (Symptomatic Bradycardia due to atrial fibrillation and complete heart block status post implantation of a Medtronic dual-chamber pacemaker)  Primary care provider's office is listed as providing transition of care (TOC) follow-up.  Patient has discharge instruction to follow-up with cardiology on 12/17/18.   For additional questions please contact:  Edwena Felty A. Jancie Kercher, BSN, RN-BC [facility] PRIMARY CARE Navigator Cell: [phone]

## 2018-12-16 ENCOUNTER — Other Ambulatory Visit: Payer: Self-pay | Admitting: *Deleted

## 2018-12-16 NOTE — Patient Outreach (Signed)
[facility]) Care Management  12/16/2018  Douglas Walker [birth year] [id number]   Met with d/c planner Douglas Walker at [facility] prior to visiting this patient .  Douglas Walker made me aware this patient's discharge plan is to stay at [facility] facility until Friday  And discharge to home on Saturday 12/19/18 with [facility] services.  Patient has a cardiology appointment on 12/17/18 which his son Douglas Walker is attending with him.  Met with patient at the bedside.  Patient sitting up in wheelchair.  Patient noted to be HOH. Patient aware his discharge plan is for Saturday.  Patient states he is frustrated to now have to constantly use a walker because he was walking fine prior to surgery.  [facility] Management services and patient agreed to services.  Patient stated he son fills his medication box and takes him to all his MD appointments.  Patient states calling his son about his care is best and gave verbal permission to reach out to son. [place].   Referral sent for [facility] Manager to reach out to patient at discharge. Made [facility] UM Team member [place] aware of [facility] CM involvement.   Rutherford Limerick RN, BSN [place] Acute Care Coordinator [phone]) Business Mobile [phone]) Toll free office

## 2018-12-17 ENCOUNTER — Ambulatory Visit (INDEPENDENT_AMBULATORY_CARE_PROVIDER_SITE_OTHER): Payer: Medicare Other | Admitting: *Deleted

## 2018-12-17 ENCOUNTER — Ambulatory Visit: Payer: Medicare Other | Admitting: Neurology

## 2018-12-17 DIAGNOSIS — I442 Atrioventricular block, complete: Secondary | ICD-10-CM | POA: Diagnosis not present

## 2018-12-17 NOTE — Progress Notes (Signed)
Wound check appointment. Steri-strips removed. Wound without redness or edema. Incision edges approximated, wound well healed. Normal device function. Thresholds, sensing, and impedances consistent with implant measurements. RA lead is HIS lead. HIS lead appears non selective until LOC at 0.50V@ 1.17ms  Device programmed at 3.5V programmed on for extra safety margin until 3 month visit. Histogram distribution appropriate for patient and level of activity. No mode switches or high ventricular rates noted. Patient educated about wound care, arm mobility, lifting restrictions. ROV w/ GT 03/17/2019

## 2018-12-19 DIAGNOSIS — I442 Atrioventricular block, complete: Secondary | ICD-10-CM | POA: Diagnosis not present

## 2018-12-19 DIAGNOSIS — R55 Syncope and collapse: Secondary | ICD-10-CM | POA: Diagnosis not present

## 2018-12-19 DIAGNOSIS — Z950 Presence of cardiac pacemaker: Secondary | ICD-10-CM | POA: Diagnosis not present

## 2018-12-19 DIAGNOSIS — W19XXXA Unspecified fall, initial encounter: Secondary | ICD-10-CM | POA: Diagnosis not present

## 2018-12-19 DIAGNOSIS — I480 Paroxysmal atrial fibrillation: Secondary | ICD-10-CM | POA: Diagnosis not present

## 2018-12-20 DIAGNOSIS — D696 Thrombocytopenia, unspecified: Secondary | ICD-10-CM | POA: Diagnosis not present

## 2018-12-20 DIAGNOSIS — I442 Atrioventricular block, complete: Secondary | ICD-10-CM | POA: Diagnosis not present

## 2018-12-20 DIAGNOSIS — G629 Polyneuropathy, unspecified: Secondary | ICD-10-CM | POA: Diagnosis not present

## 2018-12-20 DIAGNOSIS — N400 Enlarged prostate without lower urinary tract symptoms: Secondary | ICD-10-CM | POA: Diagnosis not present

## 2018-12-20 DIAGNOSIS — Z7901 Long term (current) use of anticoagulants: Secondary | ICD-10-CM | POA: Diagnosis not present

## 2018-12-20 DIAGNOSIS — Z95810 Presence of automatic (implantable) cardiac defibrillator: Secondary | ICD-10-CM | POA: Diagnosis not present

## 2018-12-20 DIAGNOSIS — M48061 Spinal stenosis, lumbar region without neurogenic claudication: Secondary | ICD-10-CM | POA: Diagnosis not present

## 2018-12-20 DIAGNOSIS — Z8673 Personal history of transient ischemic attack (TIA), and cerebral infarction without residual deficits: Secondary | ICD-10-CM | POA: Diagnosis not present

## 2018-12-20 DIAGNOSIS — I2510 Atherosclerotic heart disease of native coronary artery without angina pectoris: Secondary | ICD-10-CM | POA: Diagnosis not present

## 2018-12-20 DIAGNOSIS — H3530 Unspecified macular degeneration: Secondary | ICD-10-CM | POA: Diagnosis not present

## 2018-12-20 DIAGNOSIS — I5032 Chronic diastolic (congestive) heart failure: Secondary | ICD-10-CM | POA: Diagnosis not present

## 2018-12-20 DIAGNOSIS — Z48812 Encounter for surgical aftercare following surgery on the circulatory system: Secondary | ICD-10-CM | POA: Diagnosis not present

## 2018-12-20 DIAGNOSIS — E039 Hypothyroidism, unspecified: Secondary | ICD-10-CM | POA: Diagnosis not present

## 2018-12-20 DIAGNOSIS — F329 Major depressive disorder, single episode, unspecified: Secondary | ICD-10-CM | POA: Diagnosis not present

## 2018-12-20 DIAGNOSIS — I480 Paroxysmal atrial fibrillation: Secondary | ICD-10-CM | POA: Diagnosis not present

## 2018-12-20 DIAGNOSIS — I130 Hypertensive heart and chronic kidney disease with heart failure and stage 1 through stage 4 chronic kidney disease, or unspecified chronic kidney disease: Secondary | ICD-10-CM | POA: Diagnosis not present

## 2018-12-20 DIAGNOSIS — H8110 Benign paroxysmal vertigo, unspecified ear: Secondary | ICD-10-CM | POA: Diagnosis not present

## 2018-12-20 DIAGNOSIS — N183 Chronic kidney disease, stage 3 (moderate): Secondary | ICD-10-CM | POA: Diagnosis not present

## 2018-12-20 DIAGNOSIS — Z5181 Encounter for therapeutic drug level monitoring: Secondary | ICD-10-CM | POA: Diagnosis not present

## 2018-12-20 DIAGNOSIS — Z9181 History of falling: Secondary | ICD-10-CM | POA: Diagnosis not present

## 2018-12-20 DIAGNOSIS — R000 Tachycardia, unspecified: Secondary | ICD-10-CM | POA: Diagnosis not present

## 2018-12-21 ENCOUNTER — Other Ambulatory Visit: Payer: Self-pay

## 2018-12-21 NOTE — Patient Outreach (Signed)
[facility]) Care Management  12/21/2018  MARIOS GAISER 01-16-1925 [id number]   Received referral on 12-17-2018 due to member's recent discharge from [facility] Saturday Dec. 21 2019. Patient states his son [name] is his main care coordinator, but the patient lives alone. Per referral, patient gave permission to call his son [name] at [phone] to discuss any care concerns.   PMH: Recent hospital discharge due to PPM from 12-04-2018 to 12-07-2018; PAFIB; Mobitz type II atrioventricular block; Essential HTN; Tachycardia; BPPV; Complete Heart Block; SSS; Fracture of L rib; CKD-3; Thrombocytopenia (follows up at [facility]); Fall; Chronic Low Back pain.  Called member's son [name] at number above and introduced self and confirmed HIPPA identity. Explained [facility] and it's benefits and [name] stated that [facility] is currently providing all member's needs at this time and stated that [facility] services are not needed at this time.   Case closed. Will notify PCP.  Benjamine Mola "ANN" Josiah Lobo, RN-BSN  [facility] Care Management  Community Care Management Coordinator  [phone] [email or web address]

## 2018-12-22 DIAGNOSIS — Z48812 Encounter for surgical aftercare following surgery on the circulatory system: Secondary | ICD-10-CM | POA: Diagnosis not present

## 2018-12-22 DIAGNOSIS — I442 Atrioventricular block, complete: Secondary | ICD-10-CM | POA: Diagnosis not present

## 2018-12-22 DIAGNOSIS — I480 Paroxysmal atrial fibrillation: Secondary | ICD-10-CM | POA: Diagnosis not present

## 2018-12-22 DIAGNOSIS — I130 Hypertensive heart and chronic kidney disease with heart failure and stage 1 through stage 4 chronic kidney disease, or unspecified chronic kidney disease: Secondary | ICD-10-CM | POA: Diagnosis not present

## 2018-12-22 DIAGNOSIS — I5032 Chronic diastolic (congestive) heart failure: Secondary | ICD-10-CM | POA: Diagnosis not present

## 2018-12-22 DIAGNOSIS — I2510 Atherosclerotic heart disease of native coronary artery without angina pectoris: Secondary | ICD-10-CM | POA: Diagnosis not present

## 2018-12-24 ENCOUNTER — Ambulatory Visit (INDEPENDENT_AMBULATORY_CARE_PROVIDER_SITE_OTHER): Payer: Medicare Other | Admitting: *Deleted

## 2018-12-24 DIAGNOSIS — I130 Hypertensive heart and chronic kidney disease with heart failure and stage 1 through stage 4 chronic kidney disease, or unspecified chronic kidney disease: Secondary | ICD-10-CM | POA: Diagnosis not present

## 2018-12-24 DIAGNOSIS — I480 Paroxysmal atrial fibrillation: Secondary | ICD-10-CM

## 2018-12-24 DIAGNOSIS — I5032 Chronic diastolic (congestive) heart failure: Secondary | ICD-10-CM | POA: Diagnosis not present

## 2018-12-24 DIAGNOSIS — Z5181 Encounter for therapeutic drug level monitoring: Secondary | ICD-10-CM

## 2018-12-24 DIAGNOSIS — I442 Atrioventricular block, complete: Secondary | ICD-10-CM | POA: Diagnosis not present

## 2018-12-24 DIAGNOSIS — Z48812 Encounter for surgical aftercare following surgery on the circulatory system: Secondary | ICD-10-CM | POA: Diagnosis not present

## 2018-12-24 DIAGNOSIS — I2510 Atherosclerotic heart disease of native coronary artery without angina pectoris: Secondary | ICD-10-CM | POA: Diagnosis not present

## 2018-12-24 LAB — POCT INR: INR: 1.3 — AB (ref 2.0–3.0)

## 2018-12-24 NOTE — Patient Instructions (Signed)
Description   Today take 2 tablets then restart your normal dose which is 1.5 tablets every day except 1 tablet on Mondays. Recheck INR in 1 week.  Call # [phone] if placed on any new medications.

## 2018-12-25 DIAGNOSIS — Z48812 Encounter for surgical aftercare following surgery on the circulatory system: Secondary | ICD-10-CM | POA: Diagnosis not present

## 2018-12-25 DIAGNOSIS — I130 Hypertensive heart and chronic kidney disease with heart failure and stage 1 through stage 4 chronic kidney disease, or unspecified chronic kidney disease: Secondary | ICD-10-CM | POA: Diagnosis not present

## 2018-12-25 DIAGNOSIS — I480 Paroxysmal atrial fibrillation: Secondary | ICD-10-CM | POA: Diagnosis not present

## 2018-12-25 DIAGNOSIS — I442 Atrioventricular block, complete: Secondary | ICD-10-CM | POA: Diagnosis not present

## 2018-12-25 DIAGNOSIS — I5032 Chronic diastolic (congestive) heart failure: Secondary | ICD-10-CM | POA: Diagnosis not present

## 2018-12-25 DIAGNOSIS — I2510 Atherosclerotic heart disease of native coronary artery without angina pectoris: Secondary | ICD-10-CM | POA: Diagnosis not present

## 2018-12-28 DIAGNOSIS — I442 Atrioventricular block, complete: Secondary | ICD-10-CM | POA: Diagnosis not present

## 2018-12-28 DIAGNOSIS — I130 Hypertensive heart and chronic kidney disease with heart failure and stage 1 through stage 4 chronic kidney disease, or unspecified chronic kidney disease: Secondary | ICD-10-CM | POA: Diagnosis not present

## 2018-12-28 DIAGNOSIS — Z48812 Encounter for surgical aftercare following surgery on the circulatory system: Secondary | ICD-10-CM | POA: Diagnosis not present

## 2018-12-28 DIAGNOSIS — I480 Paroxysmal atrial fibrillation: Secondary | ICD-10-CM | POA: Diagnosis not present

## 2018-12-28 DIAGNOSIS — I2510 Atherosclerotic heart disease of native coronary artery without angina pectoris: Secondary | ICD-10-CM | POA: Diagnosis not present

## 2018-12-28 DIAGNOSIS — I5032 Chronic diastolic (congestive) heart failure: Secondary | ICD-10-CM | POA: Diagnosis not present

## 2018-12-29 DIAGNOSIS — I5032 Chronic diastolic (congestive) heart failure: Secondary | ICD-10-CM | POA: Diagnosis not present

## 2018-12-29 DIAGNOSIS — I2510 Atherosclerotic heart disease of native coronary artery without angina pectoris: Secondary | ICD-10-CM | POA: Diagnosis not present

## 2018-12-29 DIAGNOSIS — Z48812 Encounter for surgical aftercare following surgery on the circulatory system: Secondary | ICD-10-CM | POA: Diagnosis not present

## 2018-12-29 DIAGNOSIS — I130 Hypertensive heart and chronic kidney disease with heart failure and stage 1 through stage 4 chronic kidney disease, or unspecified chronic kidney disease: Secondary | ICD-10-CM | POA: Diagnosis not present

## 2018-12-29 DIAGNOSIS — I442 Atrioventricular block, complete: Secondary | ICD-10-CM | POA: Diagnosis not present

## 2018-12-29 DIAGNOSIS — I480 Paroxysmal atrial fibrillation: Secondary | ICD-10-CM | POA: Diagnosis not present

## 2018-12-31 ENCOUNTER — Ambulatory Visit (INDEPENDENT_AMBULATORY_CARE_PROVIDER_SITE_OTHER): Payer: Medicare Other

## 2018-12-31 DIAGNOSIS — I480 Paroxysmal atrial fibrillation: Secondary | ICD-10-CM

## 2018-12-31 DIAGNOSIS — M7581 Other shoulder lesions, right shoulder: Secondary | ICD-10-CM | POA: Diagnosis not present

## 2018-12-31 DIAGNOSIS — Z5181 Encounter for therapeutic drug level monitoring: Secondary | ICD-10-CM

## 2018-12-31 DIAGNOSIS — I442 Atrioventricular block, complete: Secondary | ICD-10-CM | POA: Diagnosis not present

## 2018-12-31 LAB — POCT INR: INR: 1.6 — AB (ref 2.0–3.0)

## 2018-12-31 NOTE — Patient Instructions (Signed)
Description   Today take 2 tablets then start taking 1.5 tablets every day.  Recheck INR in 1 week.  Call # [phone] if placed on any new medications.

## 2019-01-01 DIAGNOSIS — I480 Paroxysmal atrial fibrillation: Secondary | ICD-10-CM | POA: Diagnosis not present

## 2019-01-01 DIAGNOSIS — I2510 Atherosclerotic heart disease of native coronary artery without angina pectoris: Secondary | ICD-10-CM | POA: Diagnosis not present

## 2019-01-01 DIAGNOSIS — Z48812 Encounter for surgical aftercare following surgery on the circulatory system: Secondary | ICD-10-CM | POA: Diagnosis not present

## 2019-01-01 DIAGNOSIS — I5032 Chronic diastolic (congestive) heart failure: Secondary | ICD-10-CM | POA: Diagnosis not present

## 2019-01-01 DIAGNOSIS — I442 Atrioventricular block, complete: Secondary | ICD-10-CM | POA: Diagnosis not present

## 2019-01-01 DIAGNOSIS — I130 Hypertensive heart and chronic kidney disease with heart failure and stage 1 through stage 4 chronic kidney disease, or unspecified chronic kidney disease: Secondary | ICD-10-CM | POA: Diagnosis not present

## 2019-01-04 DIAGNOSIS — I2510 Atherosclerotic heart disease of native coronary artery without angina pectoris: Secondary | ICD-10-CM | POA: Diagnosis not present

## 2019-01-04 DIAGNOSIS — I130 Hypertensive heart and chronic kidney disease with heart failure and stage 1 through stage 4 chronic kidney disease, or unspecified chronic kidney disease: Secondary | ICD-10-CM | POA: Diagnosis not present

## 2019-01-04 DIAGNOSIS — I442 Atrioventricular block, complete: Secondary | ICD-10-CM | POA: Diagnosis not present

## 2019-01-04 DIAGNOSIS — I480 Paroxysmal atrial fibrillation: Secondary | ICD-10-CM | POA: Diagnosis not present

## 2019-01-04 DIAGNOSIS — I5032 Chronic diastolic (congestive) heart failure: Secondary | ICD-10-CM | POA: Diagnosis not present

## 2019-01-04 DIAGNOSIS — Z48812 Encounter for surgical aftercare following surgery on the circulatory system: Secondary | ICD-10-CM | POA: Diagnosis not present

## 2019-01-05 DIAGNOSIS — I5032 Chronic diastolic (congestive) heart failure: Secondary | ICD-10-CM | POA: Diagnosis not present

## 2019-01-05 DIAGNOSIS — I442 Atrioventricular block, complete: Secondary | ICD-10-CM | POA: Diagnosis not present

## 2019-01-05 DIAGNOSIS — Z48812 Encounter for surgical aftercare following surgery on the circulatory system: Secondary | ICD-10-CM | POA: Diagnosis not present

## 2019-01-05 DIAGNOSIS — I2510 Atherosclerotic heart disease of native coronary artery without angina pectoris: Secondary | ICD-10-CM | POA: Diagnosis not present

## 2019-01-05 DIAGNOSIS — I130 Hypertensive heart and chronic kidney disease with heart failure and stage 1 through stage 4 chronic kidney disease, or unspecified chronic kidney disease: Secondary | ICD-10-CM | POA: Diagnosis not present

## 2019-01-05 DIAGNOSIS — I480 Paroxysmal atrial fibrillation: Secondary | ICD-10-CM | POA: Diagnosis not present

## 2019-01-06 DIAGNOSIS — I2510 Atherosclerotic heart disease of native coronary artery without angina pectoris: Secondary | ICD-10-CM | POA: Diagnosis not present

## 2019-01-06 DIAGNOSIS — I5032 Chronic diastolic (congestive) heart failure: Secondary | ICD-10-CM | POA: Diagnosis not present

## 2019-01-06 DIAGNOSIS — I480 Paroxysmal atrial fibrillation: Secondary | ICD-10-CM | POA: Diagnosis not present

## 2019-01-06 DIAGNOSIS — I442 Atrioventricular block, complete: Secondary | ICD-10-CM | POA: Diagnosis not present

## 2019-01-06 DIAGNOSIS — Z48812 Encounter for surgical aftercare following surgery on the circulatory system: Secondary | ICD-10-CM | POA: Diagnosis not present

## 2019-01-06 DIAGNOSIS — I130 Hypertensive heart and chronic kidney disease with heart failure and stage 1 through stage 4 chronic kidney disease, or unspecified chronic kidney disease: Secondary | ICD-10-CM | POA: Diagnosis not present

## 2019-01-07 ENCOUNTER — Ambulatory Visit (INDEPENDENT_AMBULATORY_CARE_PROVIDER_SITE_OTHER): Payer: Medicare Other

## 2019-01-07 DIAGNOSIS — I480 Paroxysmal atrial fibrillation: Secondary | ICD-10-CM | POA: Diagnosis not present

## 2019-01-07 DIAGNOSIS — Z5181 Encounter for therapeutic drug level monitoring: Secondary | ICD-10-CM | POA: Diagnosis not present

## 2019-01-07 LAB — POCT INR: INR: 1.6 — AB (ref 2.0–3.0)

## 2019-01-07 NOTE — Patient Instructions (Signed)
Description   Today take 2 tablets then start taking 1.5 tablets daily except 2 tablets on Mondays.  Recheck INR in 1 week.  Call # [phone] if placed on any new medications.

## 2019-01-08 DIAGNOSIS — I2510 Atherosclerotic heart disease of native coronary artery without angina pectoris: Secondary | ICD-10-CM | POA: Diagnosis not present

## 2019-01-08 DIAGNOSIS — I5032 Chronic diastolic (congestive) heart failure: Secondary | ICD-10-CM | POA: Diagnosis not present

## 2019-01-08 DIAGNOSIS — Z48812 Encounter for surgical aftercare following surgery on the circulatory system: Secondary | ICD-10-CM | POA: Diagnosis not present

## 2019-01-08 DIAGNOSIS — I130 Hypertensive heart and chronic kidney disease with heart failure and stage 1 through stage 4 chronic kidney disease, or unspecified chronic kidney disease: Secondary | ICD-10-CM | POA: Diagnosis not present

## 2019-01-08 DIAGNOSIS — I442 Atrioventricular block, complete: Secondary | ICD-10-CM | POA: Diagnosis not present

## 2019-01-08 DIAGNOSIS — I480 Paroxysmal atrial fibrillation: Secondary | ICD-10-CM | POA: Diagnosis not present

## 2019-01-11 DIAGNOSIS — I5032 Chronic diastolic (congestive) heart failure: Secondary | ICD-10-CM | POA: Diagnosis not present

## 2019-01-11 DIAGNOSIS — I480 Paroxysmal atrial fibrillation: Secondary | ICD-10-CM | POA: Diagnosis not present

## 2019-01-11 DIAGNOSIS — Z48812 Encounter for surgical aftercare following surgery on the circulatory system: Secondary | ICD-10-CM | POA: Diagnosis not present

## 2019-01-11 DIAGNOSIS — I130 Hypertensive heart and chronic kidney disease with heart failure and stage 1 through stage 4 chronic kidney disease, or unspecified chronic kidney disease: Secondary | ICD-10-CM | POA: Diagnosis not present

## 2019-01-11 DIAGNOSIS — I442 Atrioventricular block, complete: Secondary | ICD-10-CM | POA: Diagnosis not present

## 2019-01-11 DIAGNOSIS — I2510 Atherosclerotic heart disease of native coronary artery without angina pectoris: Secondary | ICD-10-CM | POA: Diagnosis not present

## 2019-01-14 ENCOUNTER — Ambulatory Visit (INDEPENDENT_AMBULATORY_CARE_PROVIDER_SITE_OTHER): Payer: Medicare Other | Admitting: Pharmacist

## 2019-01-14 DIAGNOSIS — I480 Paroxysmal atrial fibrillation: Secondary | ICD-10-CM | POA: Diagnosis not present

## 2019-01-14 DIAGNOSIS — Z5181 Encounter for therapeutic drug level monitoring: Secondary | ICD-10-CM | POA: Diagnosis not present

## 2019-01-14 LAB — POCT INR: INR: 2.1 (ref 2.0–3.0)

## 2019-01-14 NOTE — Patient Instructions (Signed)
Description   Continue taking 1.5 tablets daily except 2 tablets on Mondays.  Recheck INR in 2 weeks.  Call # [phone] if placed on any new medications.

## 2019-01-19 DIAGNOSIS — I442 Atrioventricular block, complete: Secondary | ICD-10-CM | POA: Diagnosis not present

## 2019-01-19 DIAGNOSIS — I480 Paroxysmal atrial fibrillation: Secondary | ICD-10-CM | POA: Diagnosis not present

## 2019-01-19 DIAGNOSIS — I130 Hypertensive heart and chronic kidney disease with heart failure and stage 1 through stage 4 chronic kidney disease, or unspecified chronic kidney disease: Secondary | ICD-10-CM | POA: Diagnosis not present

## 2019-01-19 DIAGNOSIS — Z48812 Encounter for surgical aftercare following surgery on the circulatory system: Secondary | ICD-10-CM | POA: Diagnosis not present

## 2019-01-19 DIAGNOSIS — I2510 Atherosclerotic heart disease of native coronary artery without angina pectoris: Secondary | ICD-10-CM | POA: Diagnosis not present

## 2019-01-19 DIAGNOSIS — I5032 Chronic diastolic (congestive) heart failure: Secondary | ICD-10-CM | POA: Diagnosis not present

## 2019-01-20 ENCOUNTER — Ambulatory Visit (INDEPENDENT_AMBULATORY_CARE_PROVIDER_SITE_OTHER): Payer: Medicare Other | Admitting: Podiatry

## 2019-01-20 ENCOUNTER — Encounter: Payer: Self-pay | Admitting: Podiatry

## 2019-01-20 DIAGNOSIS — M79676 Pain in unspecified toe(s): Secondary | ICD-10-CM | POA: Diagnosis not present

## 2019-01-20 DIAGNOSIS — D689 Coagulation defect, unspecified: Secondary | ICD-10-CM

## 2019-01-20 DIAGNOSIS — L608 Other nail disorders: Secondary | ICD-10-CM | POA: Diagnosis not present

## 2019-01-20 DIAGNOSIS — E1142 Type 2 diabetes mellitus with diabetic polyneuropathy: Secondary | ICD-10-CM | POA: Diagnosis not present

## 2019-01-20 DIAGNOSIS — B351 Tinea unguium: Secondary | ICD-10-CM | POA: Diagnosis not present

## 2019-01-20 NOTE — Progress Notes (Signed)
Complaint:  Visit Type: Patient returns to my office for continued preventative foot care services. Complaint: Patient states" my nails have grown long and thick and become painful to walk and wear shoes" Patient has been taking coumadin.. The patient presents for preventative foot care services. No changes to ROS  Podiatric Exam: Vascular: dorsalis pedis and posterior tibial pulses are not  palpable bilateral due tp severe swelling feet.. Capillary return is immediate. Temperature gradient is WNL. Skin turgor WNL  Sensorium: Normal Semmes Weinstein monofilament test. Normal tactile sensation bilaterally. Nail Exam: Pt has thick disfigured discolored nails with subungual debris noted bilateral entire nail hallux through fifth toenails.  Pincer nails hallux  B/L.  Incurvation lateral border right great toenail.  No infection. Ulcer Exam: There is no evidence of ulcer or pre-ulcerative changes or infection. Orthopedic Exam: Muscle tone and strength are WNL. No limitations in general ROM. No crepitus or effusions noted. Foot type and digits show no abnormalities. Bony prominences are unremarkable. Skin: No Porokeratosis. No infection or ulcers  Diagnosis:  Onychomycosis, , Pain in right toe, pain in left toes  Treatment & Plan Procedures and Treatment: Consent by patient was obtained for treatment procedures.   Debridement of mycotic and hypertrophic toenails, 1 through 5 bilateral and clearing of subungual debris. No ulceration, no infection noted. Iatrogenic lesion cauterized in lateral border right hallux. Return Visit-Office Procedure: Patient instructed to return to the office for a follow up visit 3 months for continued evaluation and treatment.    Gardiner Barefoot DPM

## 2019-01-24 NOTE — Progress Notes (Signed)
Cardiology Office Note:    Date:  01/25/2019   ID:  Douglas Walker, DOB 1925/09/02, MRN [id number]  PCP:  Josetta Huddle, MD  Cardiologist:  Sinclair Grooms, MD   Referring MD: Josetta Huddle, MD   Chief Complaint  Patient presents with  . Loss of Consciousness    Pacemaker    History of Present Illness:    Douglas Walker is a 85 y.o. male with a hx of tachycardia-bradycardia syndrome per 30 day monitor, history of recurring syncopal episodes, history of paroxysmal atrial fibrillation, chronic anticoagulation therapy, prior history of TIA,coronary artery disease, and PPM therapy 11/24/2018.  He is accompanied by his son.  After permanent pacemaker insertion by Dr. Lovena Le late last year, 2 days of inactivity in the hospital led to significant deconditioning which resulted in a 10-day rehab stay.  He has been encouraged to use a walker to avoid falls.  No recurrence of near syncope/syncope.  He is very thankful that the pacemaker was placed.  Low energy, dyspnea, and significant lower extremity swelling has completely resolved.  His son also comments on his increased energy and ability to be more independent.  He has not been compliant with using his 4-prong walker as recommended by PT.  Past Medical History:  Diagnosis Date  . Chronic low back pain 12/15/2017  . CKD (chronic kidney disease) stage 3, GFR 30-59 ml/min (HCC)   . Coronary artery disease   . Essential tremor 02/28/2016  . GERD (gastroesophageal reflux disease)   . Hernia   . Hyperlipemia   . Hypertension   . Macular degeneration   . Paroxysmal atrial fibrillation ([place]) 10/28/2013  . Presence of permanent cardiac pacemaker 12/04/2018  . Tachycardia-bradycardia syndrome ([place]) 09/13/2014  . TIA (transient ischemic attack)     Past Surgical History:  Procedure Laterality Date  . APPENDECTOMY    . EYE SURGERY    . HERNIA REPAIR    . HERNIA REPAIR    . INSERT / REPLACE / REMOVE PACEMAKER  12/04/2018  .  PACEMAKER IMPLANT N/A 12/04/2018   Procedure: PACEMAKER IMPLANT;  Surgeon: Evans Lance, MD;  Location: [facility];  Service: Cardiovascular;  Laterality: N/A;    Current Medications: Current Meds  Medication Sig  . acetaminophen (TYLENOL) 500 MG tablet Take 1,000 mg by mouth every 6 (six) hours as needed for mild pain.  . beta carotene w/minerals (OCUVITE) tablet Take 1 tablet by mouth daily.   . DULoxetine (CYMBALTA) 20 MG capsule Take 20 mg by mouth 2 (two) times daily.  . furosemide (LASIX) 40 MG tablet Take 60 mg by mouth daily. Take one and a half (1 1/2 tablets (60 mg) by mouth daily.  Marland Kitchen levothyroxine (SYNTHROID, LEVOTHROID) 25 MCG tablet Take 1 tablet (25 mcg total) by mouth daily before breakfast.  . losartan (COZAAR) 50 MG tablet Take 50 mg by mouth daily.  . pregabalin (LYRICA) 25 MG capsule Take 25 mg by mouth at bedtime.  . primidone (MYSOLINE) 250 MG tablet Take 250 mg by mouth 2 (two) times daily.   . Tamsulosin HCl (FLOMAX) 0.4 MG CAPS Take 0.4 mg by mouth every evening.   . topiramate (TOPAMAX) 50 MG tablet Take 50 mg by mouth 2 (two) times daily.  Marland Kitchen warfarin (COUMADIN) 5 MG tablet TAKE AS DIRECTED BY COUMADIN CLINIC (Patient taking differently: Take 5-7.5 mg by mouth See admin instructions. TAKE AS DIRECTED BY COUMADIN CLINIC 5 mg on Monday; 7.5 mg all other days)  Allergies:   Sulfa antibiotics   Social History   Socioeconomic History  . Marital status: Widowed    Spouse name: Not on file  . Number of children: 1  . Years of education: 35  . Highest education level: Not on file  Occupational History    Comment: retired  Scientific laboratory technician  . Financial resource strain: Not on file  . Food insecurity:    Worry: Not on file    Inability: Not on file  . Transportation needs:    Medical: Not on file    Non-medical: Not on file  Tobacco Use  . Smoking status: Former Smoker    Last attempt to quit: 12/30/1962    Years since quitting: 56.1  . Smokeless  tobacco: Never Used  Substance and Sexual Activity  . Alcohol use: No  . Drug use: No  . Sexual activity: Not Currently  Lifestyle  . Physical activity:    Days per week: Not on file    Minutes per session: Not on file  . Stress: Not on file  Relationships  . Social connections:    Talks on phone: Not on file    Gets together: Not on file    Attends religious service: Not on file    Active member of club or organization: Not on file    Attends meetings of clubs or organizations: Not on file    Relationship status: Not on file  Other Topics Concern  . Not on file  Social History Narrative   Lives alone   Right-handed   Caffeine use- caffeine free coffee, occas soda     Family History: The patient's family history includes Blindness in his sister; GI problems in his mother; Hearing loss in his sister; Other in his sister. There is no history of Heart attack or Stroke.  ROS:   Please see the history of present illness.    Depression, back discomfort, and arthritis.  All other systems reviewed and are negative.  EKGs/Labs/Other Studies Reviewed:    The following studies were reviewed today: No new functional data.  EKG:  EKG performed on December 05, 2018 demonstrates atrial tracking, ventricular pacing, nonspecific ST-T wave abnormality.  The tracing was personally reviewed.  Recent Labs: 12/02/2018: BUN 29; Creatinine, Ser 1.45; Hemoglobin 11.2; Platelets 94; Potassium 4.3; Sodium 142; TSH 6.730  Recent Lipid Panel No results found for: CHOL, TRIG, HDL, CHOLHDL, VLDL, LDLCALC, LDLDIRECT  Physical Exam:    VS:  BP (!) 142/76   Pulse 70   Ht 5\' 9"  (1.753 m)   Wt 193 lb 12.8 oz (87.9 kg)   SpO2 95%   BMI 28.62 kg/m     Wt Readings from Last 3 Encounters:  01/25/19 193 lb 12.8 oz (87.9 kg)  12/07/18 201 lb 6.4 oz (91.4 kg)  11/17/18 207 lb (93.9 kg)     GEN: Appears compatible with his stated age.. No acute distress HEENT: Normal NECK: No JVD. LYMPHATICS: No  lymphadenopathy CARDIAC: RRR.  No murmur, no gallop, there is been complete resolution of peripheral edema VASCULAR: 2+ bilateral radial pulses, no carotid bruits RESPIRATORY:  Clear to auscultation without rales, wheezing or rhonchi  ABDOMEN: Soft, non-tender, non-distended, No pulsatile mass, MUSCULOSKELETAL: No deformity  SKIN: Warm and dry NEUROLOGIC:  Alert and oriented x 3 PSYCHIATRIC:  Normal affect   ASSESSMENT:    1. Paroxysmal atrial fibrillation (HCC)   2. Mobitz type II atrioventricular block   3. Essential hypertension   4. Chronic anticoagulation  5. Chronic diastolic CHF (congestive heart failure) ([place])   6. CKD (chronic kidney disease) stage 3, GFR 30-59 ml/min: Probable   7. Sick sinus syndrome (HCC)    PLAN:    In order of problems listed above:  1. Tachycardia-bradycardia syndrome related to paroxysmal atrial fibrillation and high-grade AV block is now been successfully treated with permanent pacemaker therapy.  No specific device related complications. 2. Pacemaker therapy has been instituted. 3. Blood pressure is relatively high today.  At his age it is not excessive.  Continue current therapy. 4. No bleeding complications on Coumadin.  Follows in the Coumadin clinic.  BUN and creatinine performed prior to pacemaker implantation were adequate.  Now that he has completely mobilize peripheral edema, he needs to have a repeat basic metabolic panel which I failed to order while he was in the office.  We will see if we can get this done sometime within the next several days to 1 week. 5. Volume status is back to normal.  Basic metabolic panel needs to be performed. 6. Needs basic metabolic panel.  Clinical follow-up with me in 9 to 12 months.  Cautioned to use his walker to avoid injury.   Medication Adjustments/Labs and Tests Ordered: Current medicines are reviewed at length with the patient today.  Concerns regarding medicines are outlined above.  No orders of  the defined types were placed in this encounter.  No orders of the defined types were placed in this encounter.   Patient Instructions  Medication Instructions:  Your physician recommends that you continue on your current medications as directed. Please refer to the Current Medication list given to you today.  If you need a refill on your cardiac medications before your next appointment, please call your pharmacy.   Lab work: None ordered If you have labs (blood work) drawn today and your tests are completely normal, you will receive your results only by: Marland Kitchen MyChart Message (if you have MyChart) OR . A paper copy in the mail If you have any lab test that is abnormal or we need to change your treatment, we will call you to review the results.  Testing/Procedures: None ordered  Follow-Up: At [facility], you and your health needs are our priority.  As part of our continuing mission to provide you with exceptional heart care, we have created designated Provider Care Teams.  These Care Teams include your primary Cardiologist (physician) and Advanced Practice Providers (APPs -  Physician Assistants and Nurse Practitioners) who all work together to provide you with the care you need, when you need it. . You will need a follow up appointment in 1 year.  Please call our office 2 months in advance to schedule this appointment.  You may see Sinclair Grooms, MD or one of the following Advanced Practice Providers on your designated Care Team:   . Truitt Merle, NP . Cecilie Kicks, NP . Kathyrn Drown, NP   Any Other Special Instructions Will Be Listed Below (If Applicable).     Signed, Sinclair Grooms, MD  01/25/2019 2:45 PM    [facility]

## 2019-01-25 ENCOUNTER — Telehealth: Payer: Self-pay | Admitting: Neurology

## 2019-01-25 ENCOUNTER — Ambulatory Visit (INDEPENDENT_AMBULATORY_CARE_PROVIDER_SITE_OTHER): Payer: Medicare Other | Admitting: Interventional Cardiology

## 2019-01-25 ENCOUNTER — Encounter: Payer: Self-pay | Admitting: Interventional Cardiology

## 2019-01-25 ENCOUNTER — Ambulatory Visit (INDEPENDENT_AMBULATORY_CARE_PROVIDER_SITE_OTHER): Payer: Medicare Other

## 2019-01-25 ENCOUNTER — Telehealth: Payer: Self-pay

## 2019-01-25 VITALS — BP 142/76 | HR 70 | Ht 69.0 in | Wt 193.8 lb

## 2019-01-25 DIAGNOSIS — N183 Chronic kidney disease, stage 3 unspecified: Secondary | ICD-10-CM

## 2019-01-25 DIAGNOSIS — Z5181 Encounter for therapeutic drug level monitoring: Secondary | ICD-10-CM

## 2019-01-25 DIAGNOSIS — I441 Atrioventricular block, second degree: Secondary | ICD-10-CM | POA: Diagnosis not present

## 2019-01-25 DIAGNOSIS — I480 Paroxysmal atrial fibrillation: Secondary | ICD-10-CM | POA: Diagnosis not present

## 2019-01-25 DIAGNOSIS — I10 Essential (primary) hypertension: Secondary | ICD-10-CM

## 2019-01-25 DIAGNOSIS — I495 Sick sinus syndrome: Secondary | ICD-10-CM | POA: Diagnosis not present

## 2019-01-25 DIAGNOSIS — Z7901 Long term (current) use of anticoagulants: Secondary | ICD-10-CM | POA: Diagnosis not present

## 2019-01-25 DIAGNOSIS — I5032 Chronic diastolic (congestive) heart failure: Secondary | ICD-10-CM

## 2019-01-25 LAB — POCT INR: INR: 1.9 — AB (ref 2.0–3.0)

## 2019-01-25 NOTE — Patient Instructions (Signed)
Description   Take 2 tablets today and tomorrow, then resume same dosage 1.5 tablets daily except 2 tablets on Mondays.  Recheck INR in 3 weeks.  Call # [phone] if placed on any new medications.

## 2019-01-25 NOTE — Telephone Encounter (Signed)
Dr. Tamala Julian decided after being seen in the clinic today that the patient needs a BMET in 3-10 days.   Attempted to call the patient and the caretaker answered and asked that I call the son [name] [phone]. Attempted to contact the son but there was no answer an VM not set up. Will try again at another time.

## 2019-01-25 NOTE — Patient Instructions (Signed)
Medication Instructions:  Your physician recommends that you continue on your current medications as directed. Please refer to the Current Medication list given to you today.  If you need a refill on your cardiac medications before your next appointment, please call your pharmacy.   Lab work: None ordered If you have labs (blood work) drawn today and your tests are completely normal, you will receive your results only by: Marland Kitchen MyChart Message (if you have MyChart) OR . A paper copy in the mail If you have any lab test that is abnormal or we need to change your treatment, we will call you to review the results.  Testing/Procedures: None ordered  Follow-Up: At [facility], you and your health needs are our priority.  As part of our continuing mission to provide you with exceptional heart care, we have created designated Provider Care Teams.  These Care Teams include your primary Cardiologist (physician) and Advanced Practice Providers (APPs -  Physician Assistants and Nurse Practitioners) who all work together to provide you with the care you need, when you need it. . You will need a follow up appointment in 1 year.  Please call our office 2 months in advance to schedule this appointment.  You may see Sinclair Grooms, MD or one of the following Advanced Practice Providers on your designated Care Team:   . Truitt Merle, NP . Cecilie Kicks, NP . Kathyrn Drown, NP   Any Other Special Instructions Will Be Listed Below (If Applicable).

## 2019-01-25 NOTE — Telephone Encounter (Signed)
Pts son [name] (on [place]) states that the pt had to miss his last appt on 12/17/18 due to being in a rehab. He states that he was told to call when the pt is out to see if he can be fitted in the schedule. Please advise.

## 2019-01-25 NOTE — Telephone Encounter (Signed)
Douglas Walker returning the phone call. Made him aware that Dr. Tamala Julian would like for patient to have BMET checked. Appointment made on 1/30.

## 2019-01-25 NOTE — Telephone Encounter (Signed)
I contacted pt's son [name] and was able to schedule f/u for 02/02/19 appt 12 pm with a check in time of 11:30. Son had no further questions/concerns.

## 2019-01-26 DIAGNOSIS — I2510 Atherosclerotic heart disease of native coronary artery without angina pectoris: Secondary | ICD-10-CM | POA: Diagnosis not present

## 2019-01-26 DIAGNOSIS — I480 Paroxysmal atrial fibrillation: Secondary | ICD-10-CM | POA: Diagnosis not present

## 2019-01-26 DIAGNOSIS — I5032 Chronic diastolic (congestive) heart failure: Secondary | ICD-10-CM | POA: Diagnosis not present

## 2019-01-26 DIAGNOSIS — I442 Atrioventricular block, complete: Secondary | ICD-10-CM | POA: Diagnosis not present

## 2019-01-26 DIAGNOSIS — I130 Hypertensive heart and chronic kidney disease with heart failure and stage 1 through stage 4 chronic kidney disease, or unspecified chronic kidney disease: Secondary | ICD-10-CM | POA: Diagnosis not present

## 2019-01-26 DIAGNOSIS — Z48812 Encounter for surgical aftercare following surgery on the circulatory system: Secondary | ICD-10-CM | POA: Diagnosis not present

## 2019-01-28 ENCOUNTER — Other Ambulatory Visit: Payer: Medicare Other | Admitting: *Deleted

## 2019-01-28 DIAGNOSIS — N183 Chronic kidney disease, stage 3 unspecified: Secondary | ICD-10-CM

## 2019-01-28 LAB — BASIC METABOLIC PANEL
BUN/Creatinine Ratio: 24 (ref 10–24)
BUN: 35 mg/dL (ref 10–36)
CO2: 25 mmol/L (ref 20–29)
Calcium: 9.3 mg/dL (ref 8.6–10.2)
Chloride: 101 mmol/L (ref 96–106)
Creatinine, Ser: 1.46 mg/dL — ABNORMAL HIGH (ref 0.76–1.27)
GFR calc Af Amer: 48 mL/min/{1.73_m2} — ABNORMAL LOW (ref >59)
GFR calc non Af Amer: 41 mL/min/{1.73_m2} — ABNORMAL LOW (ref >59)
Glucose: 80 mg/dL (ref 65–99)
Potassium: 3.8 mmol/L (ref 3.5–5.2)
Sodium: 140 mmol/L (ref 134–144)

## 2019-02-02 ENCOUNTER — Ambulatory Visit (INDEPENDENT_AMBULATORY_CARE_PROVIDER_SITE_OTHER): Payer: Medicare Other | Admitting: Neurology

## 2019-02-02 ENCOUNTER — Encounter: Payer: Self-pay | Admitting: Neurology

## 2019-02-02 VITALS — BP 127/69 | HR 69 | Ht 69.0 in | Wt 193.0 lb

## 2019-02-02 DIAGNOSIS — G609 Hereditary and idiopathic neuropathy, unspecified: Secondary | ICD-10-CM

## 2019-02-02 DIAGNOSIS — G250 Essential tremor: Secondary | ICD-10-CM

## 2019-02-02 DIAGNOSIS — M545 Low back pain: Secondary | ICD-10-CM

## 2019-02-02 DIAGNOSIS — G8929 Other chronic pain: Secondary | ICD-10-CM

## 2019-02-02 NOTE — Progress Notes (Signed)
Reason for visit: Essential tremor, peripheral neuropathy, gait disturbance  Douglas Walker is an 85 y.o. male  History of present illness:  Douglas Walker is a 85 year old right-handed white male with a history of an essential tremor.  The patient is on Mysoline taking 250 mg twice daily, he also takes Topamax for this, he is on Lyrica for a peripheral neuropathy.  He seems to tolerate the medications fairly well.  The patient is also on Cymbalta in low-dose.  He was just recently in the hospital for pacemaker placement on 04 December 2018.  The patient feels better following the pacemaker placement, his peripheral edema has improved.  The patient complains of some right shoulder pain, he does not sleep well at night at times because of this.  He is not having a lot of troubles with his low back or his peripheral neuropathy discomfort at this point.  He returns to this office for an evaluation.  Usually walks with a walker, he has not had any falls since the pacemaker has been placed.  Past Medical History:  Diagnosis Date  . Chronic low back pain 12/15/2017  . CKD (chronic kidney disease) stage 3, GFR 30-59 ml/min (HCC)   . Coronary artery disease   . Essential tremor 02/28/2016  . GERD (gastroesophageal reflux disease)   . Hernia   . Hyperlipemia   . Hypertension   . Macular degeneration   . Paroxysmal atrial fibrillation ([place]) 10/28/2013  . Presence of permanent cardiac pacemaker 12/04/2018  . Tachycardia-bradycardia syndrome ([place]) 09/13/2014  . TIA (transient ischemic attack)     Past Surgical History:  Procedure Laterality Date  . APPENDECTOMY    . EYE SURGERY    . HERNIA REPAIR    . HERNIA REPAIR    . INSERT / REPLACE / REMOVE PACEMAKER  12/04/2018  . PACEMAKER IMPLANT N/A 12/04/2018   Procedure: PACEMAKER IMPLANT;  Surgeon: Evans Lance, MD;  Location: [place];  Service: Cardiovascular;  Laterality: N/A;    Family History  Problem Relation Age of Onset  .  GI problems Mother   . Other Sister        PAIN ISSUES  . Hearing loss Sister   . Blindness Sister   . Heart attack Neg Hx   . Stroke Neg Hx     Social history:  reports that he quit smoking about 56 years ago. He has never used smokeless tobacco. He reports that he does not drink alcohol or use drugs.    Allergies  Allergen Reactions  . Sulfa Antibiotics Other (See Comments)    Weakness, dizziness    Medications:  Prior to Admission medications   Medication Sig Start Date End Date Taking? Authorizing Provider  acetaminophen (TYLENOL) 500 MG tablet Take 1,000 mg by mouth every 6 (six) hours as needed for mild pain.   Yes [provider]  beta carotene w/minerals (OCUVITE) tablet Take 1 tablet by mouth daily.    Yes [provider]  DULoxetine (CYMBALTA) 20 MG capsule Take 20 mg by mouth 2 (two) times daily.   Yes [provider]  furosemide (LASIX) 40 MG tablet Take 60 mg by mouth daily. Take one and a half (1 1/2 tablets (60 mg) by mouth daily. 11/15/16  Yes [provider]  levothyroxine (SYNTHROID, LEVOTHROID) 25 MCG tablet Take 1 tablet (25 mcg total) by mouth daily before breakfast. 10/21/18  Yes Belva Crome, MD  losartan (COZAAR) 50 MG tablet Take 50  mg by mouth daily. 08/18/17  Yes [provider]  pregabalin (LYRICA) 25 MG capsule Take 25 mg by mouth at bedtime.   Yes [provider]  primidone (MYSOLINE) 250 MG tablet Take 250 mg by mouth 2 (two) times daily.  08/18/15  Yes [provider]  Tamsulosin HCl (FLOMAX) 0.4 MG CAPS Take 0.4 mg by mouth every evening.    Yes [provider]  topiramate (TOPAMAX) 50 MG tablet Take 50 mg by mouth 2 (two) times daily.   Yes [provider]  warfarin (COUMADIN) 5 MG tablet TAKE AS DIRECTED BY COUMADIN CLINIC Patient taking differently: Take 5-7.5 mg by mouth See admin instructions. TAKE AS DIRECTED BY COUMADIN CLINIC 5 mg on Monday; 7.5 mg all other days  09/28/18  Yes Belva Crome, MD    ROS:  Out of a complete 14 system review of symptoms, the patient complains only of the following symptoms, and all other reviewed systems are negative.  Hearing loss Decreased vision Joint pain, back pain Difficulty urinating Numbness, tremors Depression  Blood pressure 127/69, pulse 69, height 5\' 9"  (1.753 m), weight 193 lb (87.5 kg).  Physical Exam  General: The patient is alert and cooperative at the time of the examination.  Skin: No significant peripheral edema is noted.   Neurologic Exam  Mental status: The patient is alert and oriented x 3 at the time of the examination. The patient has apparent normal recent and remote memory, with an apparently normal attention span and concentration ability.   Cranial nerves: Facial symmetry is present. Speech is normal, no aphasia or dysarthria is noted. Extraocular movements are full. Visual fields are full.  Some masking of the face is seen.  A jaw tremor is noted.  Motor: The patient has good strength in all 4 extremities.  Sensory examination: Soft touch sensation is symmetric on the face, arms, and legs.  Coordination: The patient has prominent tremors of the upper extremities, right greater than left.  Gait and station: The patient has a slightly wide-based gait, the patient can walk with a walker.  Romberg is negative.  Tandem gait was not attempted.  Reflexes: Deep tendon reflexes are symmetric.   Assessment/Plan:  1.  Peripheral neuropathy  2.  Essential tremor  3.  Chronic low back pain  The patient is overall doing fairly well at this point.  He feels better following the pacemaker placement.  We will not alter any medications today, he will follow-up in 6 months.  Jill Alexanders MD 02/02/2019 11:53 AM  [facility] [address] [place], [place] [address]  Phone [phone] Fax [phone]

## 2019-02-03 DIAGNOSIS — I2510 Atherosclerotic heart disease of native coronary artery without angina pectoris: Secondary | ICD-10-CM | POA: Diagnosis not present

## 2019-02-03 DIAGNOSIS — I130 Hypertensive heart and chronic kidney disease with heart failure and stage 1 through stage 4 chronic kidney disease, or unspecified chronic kidney disease: Secondary | ICD-10-CM | POA: Diagnosis not present

## 2019-02-03 DIAGNOSIS — I480 Paroxysmal atrial fibrillation: Secondary | ICD-10-CM | POA: Diagnosis not present

## 2019-02-03 DIAGNOSIS — I5032 Chronic diastolic (congestive) heart failure: Secondary | ICD-10-CM | POA: Diagnosis not present

## 2019-02-03 DIAGNOSIS — I442 Atrioventricular block, complete: Secondary | ICD-10-CM | POA: Diagnosis not present

## 2019-02-03 DIAGNOSIS — Z48812 Encounter for surgical aftercare following surgery on the circulatory system: Secondary | ICD-10-CM | POA: Diagnosis not present

## 2019-02-08 DIAGNOSIS — I5032 Chronic diastolic (congestive) heart failure: Secondary | ICD-10-CM | POA: Diagnosis not present

## 2019-02-08 DIAGNOSIS — I442 Atrioventricular block, complete: Secondary | ICD-10-CM | POA: Diagnosis not present

## 2019-02-08 DIAGNOSIS — I480 Paroxysmal atrial fibrillation: Secondary | ICD-10-CM | POA: Diagnosis not present

## 2019-02-08 DIAGNOSIS — I130 Hypertensive heart and chronic kidney disease with heart failure and stage 1 through stage 4 chronic kidney disease, or unspecified chronic kidney disease: Secondary | ICD-10-CM | POA: Diagnosis not present

## 2019-02-08 DIAGNOSIS — I2510 Atherosclerotic heart disease of native coronary artery without angina pectoris: Secondary | ICD-10-CM | POA: Diagnosis not present

## 2019-02-08 DIAGNOSIS — Z48812 Encounter for surgical aftercare following surgery on the circulatory system: Secondary | ICD-10-CM | POA: Diagnosis not present

## 2019-02-15 ENCOUNTER — Ambulatory Visit (INDEPENDENT_AMBULATORY_CARE_PROVIDER_SITE_OTHER): Payer: Medicare Other

## 2019-02-15 DIAGNOSIS — Z5181 Encounter for therapeutic drug level monitoring: Secondary | ICD-10-CM | POA: Diagnosis not present

## 2019-02-15 DIAGNOSIS — I480 Paroxysmal atrial fibrillation: Secondary | ICD-10-CM

## 2019-02-15 LAB — POCT INR: INR: 2.9 (ref 2.0–3.0)

## 2019-02-15 NOTE — Patient Instructions (Signed)
Description   Continue on same dosage 1.5 tablets daily except 2 tablets on Mondays.  Recheck INR in 4 weeks.  Call # [phone] if placed on any new medications.

## 2019-03-11 ENCOUNTER — Other Ambulatory Visit: Payer: Self-pay | Admitting: Neurology

## 2019-03-11 MED ORDER — PREGABALIN 25 MG PO CAPS: 25.0000 mg | ORAL_CAPSULE | Freq: Every day | ORAL | 1 refills | Status: DC

## 2019-03-11 NOTE — Telephone Encounter (Signed)
A prescription for Lyrica will be sent in.

## 2019-03-11 NOTE — Telephone Encounter (Signed)
[place] drug registry verified. Last refill was 07/27/18 # 180 for a 90 day supply provided by Dr. Jannifer Franklin. Pt has been compliant with f/u's.

## 2019-03-11 NOTE — Telephone Encounter (Signed)
Pt son(on DPR) has called re: pregabalin (LYRICA) 25 MG capsule, pt now has a new pharmacy [facility] on [address] [phone], son is asking the new script be sent there

## 2019-03-11 NOTE — Addendum Note (Signed)
Addended by: Kathrynn Ducking on: 03/11/2019 05:04 PM   Modules accepted: Orders

## 2019-03-15 ENCOUNTER — Telehealth: Payer: Self-pay

## 2019-03-15 NOTE — Telephone Encounter (Signed)
Call placed to Pt.  Spoke with Pt caregiver.  Advised at this time-routine follow up appointments are being cancelled to reduce possible exposure for our patients. Advised at this time his coumadin appointment was still scheduled.  Caregiver would receive a call if that appointment was cancelled.  Per caregiver--Pt is doing great.  Advised appointment would be cancelled and Pt would be contacted to reschedule.  Advised to call office if any needs.

## 2019-03-17 ENCOUNTER — Encounter: Payer: Medicare Other | Admitting: Internal Medicine

## 2019-03-17 ENCOUNTER — Other Ambulatory Visit: Payer: Self-pay

## 2019-03-17 ENCOUNTER — Ambulatory Visit (INDEPENDENT_AMBULATORY_CARE_PROVIDER_SITE_OTHER): Payer: Medicare Other | Admitting: *Deleted

## 2019-03-17 DIAGNOSIS — I480 Paroxysmal atrial fibrillation: Secondary | ICD-10-CM

## 2019-03-17 DIAGNOSIS — Z5181 Encounter for therapeutic drug level monitoring: Secondary | ICD-10-CM

## 2019-03-17 LAB — POCT INR: INR: 2.4 (ref 2.0–3.0)

## 2019-03-17 NOTE — Patient Instructions (Signed)
Description   Continue on same dosage 1.5 tablets daily except 2 tablets on Mondays.  Recheck INR in 5 weeks. Call # [phone] if placed on any new medications.

## 2019-03-31 ENCOUNTER — Ambulatory Visit (INDEPENDENT_AMBULATORY_CARE_PROVIDER_SITE_OTHER): Payer: Medicare Other | Admitting: Podiatry

## 2019-03-31 ENCOUNTER — Other Ambulatory Visit: Payer: Self-pay

## 2019-03-31 ENCOUNTER — Encounter: Payer: Self-pay | Admitting: Podiatry

## 2019-03-31 VITALS — Temp 97.2°F

## 2019-03-31 DIAGNOSIS — M79676 Pain in unspecified toe(s): Secondary | ICD-10-CM

## 2019-03-31 DIAGNOSIS — E1142 Type 2 diabetes mellitus with diabetic polyneuropathy: Secondary | ICD-10-CM | POA: Diagnosis not present

## 2019-03-31 DIAGNOSIS — L608 Other nail disorders: Secondary | ICD-10-CM

## 2019-03-31 DIAGNOSIS — D689 Coagulation defect, unspecified: Secondary | ICD-10-CM | POA: Diagnosis not present

## 2019-03-31 DIAGNOSIS — B351 Tinea unguium: Secondary | ICD-10-CM | POA: Diagnosis not present

## 2019-03-31 NOTE — Progress Notes (Signed)
Complaint:  Visit Type: Patient returns to my office for continued preventative foot care services. Complaint: Patient states" my nails have grown long and thick and become painful to walk and wear shoes" Patient has been taking coumadin.. The patient presents for preventative foot care services. No changes to ROS.  Patient is taking coumadin.  Podiatric Exam: Vascular: dorsalis pedis and posterior tibial pulses are not  palpable bilateral due tp severe swelling feet.. Capillary return is immediate. Temperature gradient is WNL. Skin turgor WNL  Sensorium: Normal Semmes Weinstein monofilament test. Normal tactile sensation bilaterally. Nail Exam: Pt has thick disfigured discolored nails with subungual debris noted bilateral entire nail hallux through fifth toenails.  Pincer nails hallux  B/L.  Incurvation lateral border right great toenail.  No infection. Ulcer Exam: There is no evidence of ulcer or pre-ulcerative changes or infection. Orthopedic Exam: Muscle tone and strength are WNL. No limitations in general ROM. No crepitus or effusions noted. Foot type and digits show no abnormalities. Bony prominences are unremarkable. Skin: No Porokeratosis. No infection or ulcers  Diagnosis:  Onychomycosis, , Pain in right toe, pain in left toes  Treatment & Plan Procedures and Treatment: Consent by patient was obtained for treatment procedures.   Debridement of mycotic and hypertrophic toenails, 1 through 5 bilateral and clearing of subungual debris. No ulceration, no infection noted.  Return Visit-Office Procedure: Patient instructed to return to the office for a follow up visit 10 weeks  for continued evaluation and treatment.    Gardiner Barefoot DPM

## 2019-04-05 DIAGNOSIS — Z7901 Long term (current) use of anticoagulants: Secondary | ICD-10-CM | POA: Diagnosis not present

## 2019-04-05 DIAGNOSIS — G629 Polyneuropathy, unspecified: Secondary | ICD-10-CM | POA: Diagnosis not present

## 2019-04-05 DIAGNOSIS — I480 Paroxysmal atrial fibrillation: Secondary | ICD-10-CM | POA: Diagnosis not present

## 2019-04-05 DIAGNOSIS — R251 Tremor, unspecified: Secondary | ICD-10-CM | POA: Diagnosis not present

## 2019-04-05 DIAGNOSIS — I2510 Atherosclerotic heart disease of native coronary artery without angina pectoris: Secondary | ICD-10-CM | POA: Diagnosis not present

## 2019-04-05 DIAGNOSIS — I442 Atrioventricular block, complete: Secondary | ICD-10-CM | POA: Diagnosis not present

## 2019-04-05 DIAGNOSIS — I672 Cerebral atherosclerosis: Secondary | ICD-10-CM | POA: Diagnosis not present

## 2019-04-05 DIAGNOSIS — I5032 Chronic diastolic (congestive) heart failure: Secondary | ICD-10-CM | POA: Diagnosis not present

## 2019-04-05 DIAGNOSIS — R197 Diarrhea, unspecified: Secondary | ICD-10-CM | POA: Diagnosis not present

## 2019-04-08 ENCOUNTER — Ambulatory Visit (INDEPENDENT_AMBULATORY_CARE_PROVIDER_SITE_OTHER): Payer: Medicare Other | Admitting: *Deleted

## 2019-04-08 ENCOUNTER — Other Ambulatory Visit: Payer: Self-pay

## 2019-04-08 DIAGNOSIS — I495 Sick sinus syndrome: Secondary | ICD-10-CM | POA: Diagnosis not present

## 2019-04-08 LAB — CUP PACEART REMOTE DEVICE CHECK
Battery Remaining Longevity: 54 mo
Battery Voltage: 3.01 V
Brady Statistic AP VP Percent: 99.73 %
Brady Statistic AP VS Percent: 0 %
Brady Statistic AS VP Percent: 0 %
Brady Statistic AS VS Percent: 0.28 %
Brady Statistic RA Percent Paced: 99.84 %
Brady Statistic RV Percent Paced: 99.73 %
Date Time Interrogation Session: 20200409084757
Implantable Lead Implant Date: 20191206
Implantable Lead Implant Date: 20191206
Implantable Lead Location: 753860
Implantable Lead Location: 753860
Implantable Lead Model: 3830
Implantable Lead Model: 5076
Implantable Pulse Generator Implant Date: 20191206
Lead Channel Impedance Value: 285 Ohm
Lead Channel Impedance Value: 361 Ohm
Lead Channel Impedance Value: 399 Ohm
Lead Channel Impedance Value: 456 Ohm
Lead Channel Sensing Intrinsic Amplitude: 18 mV
Lead Channel Setting Pacing Amplitude: 3.5 V
Lead Channel Setting Pacing Amplitude: 3.5 V
Lead Channel Setting Pacing Pulse Width: 0.4 ms
Lead Channel Setting Sensing Sensitivity: 1.2 mV

## 2019-04-19 ENCOUNTER — Telehealth: Payer: Self-pay

## 2019-04-19 ENCOUNTER — Telehealth: Payer: Self-pay | Admitting: Interventional Cardiology

## 2019-04-19 ENCOUNTER — Encounter: Payer: Self-pay | Admitting: Cardiology

## 2019-04-19 NOTE — Telephone Encounter (Signed)
Ok to fill.  He prescribed it.

## 2019-04-19 NOTE — Telephone Encounter (Signed)
This is not a cardiac med that we prescribe. It needs to come from PCP

## 2019-04-19 NOTE — Telephone Encounter (Signed)
[facility] pharmacy is requesting a refill on levothyroxine 25 mcg. Would Dr. Tamala Julian like to refill this medication? Please address

## 2019-04-19 NOTE — Progress Notes (Signed)
Remote pacemaker transmission.   

## 2019-04-19 NOTE — Telephone Encounter (Signed)
Contacted pharmacy to inform them the refills need to be addressed with PCP.

## 2019-04-19 NOTE — Telephone Encounter (Signed)

## 2019-04-21 ENCOUNTER — Ambulatory Visit (INDEPENDENT_AMBULATORY_CARE_PROVIDER_SITE_OTHER): Payer: Medicare Other | Admitting: Pharmacist

## 2019-04-21 ENCOUNTER — Other Ambulatory Visit: Payer: Self-pay

## 2019-04-21 DIAGNOSIS — Z5181 Encounter for therapeutic drug level monitoring: Secondary | ICD-10-CM | POA: Diagnosis not present

## 2019-04-21 DIAGNOSIS — I480 Paroxysmal atrial fibrillation: Secondary | ICD-10-CM | POA: Diagnosis not present

## 2019-04-21 LAB — POCT INR: INR: 2.6 (ref 2.0–3.0)

## 2019-04-21 NOTE — Patient Instructions (Signed)
Description   Spoke with son and instructed to continue on same dosage 1.5 tablets daily except 2 tablets on Mondays.  Recheck INR in 8 weeks. Call # [phone] if placed on any new medications.

## 2019-05-15 ENCOUNTER — Other Ambulatory Visit: Payer: Self-pay | Admitting: Interventional Cardiology

## 2019-05-19 DIAGNOSIS — H353133 Nonexudative age-related macular degeneration, bilateral, advanced atrophic without subfoveal involvement: Secondary | ICD-10-CM | POA: Diagnosis not present

## 2019-05-19 DIAGNOSIS — H353212 Exudative age-related macular degeneration, right eye, with inactive choroidal neovascularization: Secondary | ICD-10-CM | POA: Diagnosis not present

## 2019-05-19 DIAGNOSIS — H3561 Retinal hemorrhage, right eye: Secondary | ICD-10-CM | POA: Diagnosis not present

## 2019-05-19 DIAGNOSIS — H353222 Exudative age-related macular degeneration, left eye, with inactive choroidal neovascularization: Secondary | ICD-10-CM | POA: Diagnosis not present

## 2019-06-09 ENCOUNTER — Ambulatory Visit (INDEPENDENT_AMBULATORY_CARE_PROVIDER_SITE_OTHER): Payer: Medicare Other | Admitting: Podiatry

## 2019-06-09 ENCOUNTER — Other Ambulatory Visit: Payer: Self-pay

## 2019-06-09 ENCOUNTER — Encounter: Payer: Self-pay | Admitting: Podiatry

## 2019-06-09 VITALS — Temp 97.3°F

## 2019-06-09 DIAGNOSIS — B351 Tinea unguium: Secondary | ICD-10-CM

## 2019-06-09 DIAGNOSIS — E1142 Type 2 diabetes mellitus with diabetic polyneuropathy: Secondary | ICD-10-CM

## 2019-06-09 DIAGNOSIS — L608 Other nail disorders: Secondary | ICD-10-CM

## 2019-06-09 DIAGNOSIS — D689 Coagulation defect, unspecified: Secondary | ICD-10-CM

## 2019-06-09 DIAGNOSIS — M79676 Pain in unspecified toe(s): Secondary | ICD-10-CM

## 2019-06-09 NOTE — Progress Notes (Signed)
Complaint:  Visit Type: Patient returns to my office for continued preventative foot care services. Complaint: Patient states" my nails have grown long and thick and become painful to walk and wear shoes" Patient has been taking coumadin.. The patient presents for preventative foot care services. No changes to ROS.  Patient is taking coumadin.  Podiatric Exam: Vascular: dorsalis pedis and posterior tibial pulses are not  palpable bilateral due tp severe swelling feet.. Capillary return is immediate. Temperature gradient is WNL. Skin turgor WNL  Sensorium: Normal Semmes Weinstein monofilament test. Normal tactile sensation bilaterally. Nail Exam: Pt has thick disfigured discolored nails with subungual debris noted bilateral entire nail hallux through fifth toenails.  Pincer nails hallux  B/L.  Incurvation lateral border right great toenail.  No infection. Ulcer Exam: There is no evidence of ulcer or pre-ulcerative changes or infection. Orthopedic Exam: Muscle tone and strength are WNL. No limitations in general ROM. No crepitus or effusions noted. Foot type and digits show no abnormalities. Bony prominences are unremarkable. Skin: No Porokeratosis. No infection or ulcers  Diagnosis:  Onychomycosis, , Pain in right toe, pain in left toes  Treatment & Plan Procedures and Treatment: Consent by patient was obtained for treatment procedures.   Debridement of mycotic and hypertrophic toenails, 1 through 5 bilateral and clearing of subungual debris. No ulceration, no infection noted.  Return Visit-Office Procedure: Patient instructed to return to the office for a follow up visit 10 weeks  for continued evaluation and treatment.    Gardiner Barefoot DPM

## 2019-06-10 ENCOUNTER — Telehealth: Payer: Self-pay

## 2019-06-10 DIAGNOSIS — N644 Mastodynia: Secondary | ICD-10-CM | POA: Diagnosis not present

## 2019-06-10 NOTE — Telephone Encounter (Signed)

## 2019-06-16 ENCOUNTER — Other Ambulatory Visit: Payer: Self-pay

## 2019-06-16 ENCOUNTER — Ambulatory Visit (INDEPENDENT_AMBULATORY_CARE_PROVIDER_SITE_OTHER): Payer: Medicare Other | Admitting: *Deleted

## 2019-06-16 DIAGNOSIS — Z5181 Encounter for therapeutic drug level monitoring: Secondary | ICD-10-CM

## 2019-06-16 DIAGNOSIS — I480 Paroxysmal atrial fibrillation: Secondary | ICD-10-CM | POA: Diagnosis not present

## 2019-06-16 LAB — POCT INR: INR: 2.3 (ref 2.0–3.0)

## 2019-06-16 NOTE — Patient Instructions (Signed)
Description    Pt's son present. Instructed pt to continue on same dosage 1.5 tablets daily except 2 tablets on Mondays.  Recheck INR in 8 weeks. Call # [phone] if placed on any new medications.

## 2019-07-08 ENCOUNTER — Ambulatory Visit (INDEPENDENT_AMBULATORY_CARE_PROVIDER_SITE_OTHER): Payer: Medicare Other | Admitting: *Deleted

## 2019-07-08 DIAGNOSIS — I442 Atrioventricular block, complete: Secondary | ICD-10-CM

## 2019-07-08 DIAGNOSIS — I495 Sick sinus syndrome: Secondary | ICD-10-CM

## 2019-07-09 LAB — CUP PACEART REMOTE DEVICE CHECK
Battery Remaining Longevity: 52 mo
Battery Voltage: 2.97 V
Brady Statistic AP VP Percent: 99.96 %
Brady Statistic AP VS Percent: 0 %
Brady Statistic AS VP Percent: 0 %
Brady Statistic AS VS Percent: 0.04 %
Brady Statistic RA Percent Paced: 99.97 %
Brady Statistic RV Percent Paced: 99.96 %
Date Time Interrogation Session: 20200709074150
Implantable Lead Implant Date: 20191206
Implantable Lead Implant Date: 20191206
Implantable Lead Location: 753860
Implantable Lead Location: 753860
Implantable Lead Model: 3830
Implantable Lead Model: 5076
Implantable Pulse Generator Implant Date: 20191206
Lead Channel Impedance Value: 285 Ohm
Lead Channel Impedance Value: 380 Ohm
Lead Channel Impedance Value: 399 Ohm
Lead Channel Impedance Value: 494 Ohm
Lead Channel Sensing Intrinsic Amplitude: 18.5 mV
Lead Channel Sensing Intrinsic Amplitude: 18.5 mV
Lead Channel Sensing Intrinsic Amplitude: 2 mV
Lead Channel Setting Pacing Amplitude: 3.5 V
Lead Channel Setting Pacing Amplitude: 3.5 V
Lead Channel Setting Pacing Pulse Width: 0.4 ms
Lead Channel Setting Sensing Sensitivity: 1.2 mV

## 2019-07-17 ENCOUNTER — Other Ambulatory Visit: Payer: Self-pay | Admitting: Interventional Cardiology

## 2019-07-19 ENCOUNTER — Telehealth: Payer: Self-pay | Admitting: Interventional Cardiology

## 2019-07-19 NOTE — Telephone Encounter (Signed)
Returned call to the Pharmacy and spoke with Abby advised that we do not want to mix his refill as he is 85 yrs old and don't want to have him taking different manufactures and cause variations in his INR. She will have his normal manufacture pills on hand tomorrow and can fill at that time. So she will not fill RX today and fill the RX tomorrow so pt has the same manufacturer.

## 2019-07-19 NOTE — Progress Notes (Signed)
Remote pacemaker transmission.   

## 2019-07-19 NOTE — Telephone Encounter (Signed)
New Message  Pt c/o medication issue:  1. Name of Medication:   warfarin (COUMADIN) 5 MG tablet    2. How are you currently taking this medication (dosage and times per day)?  Take 1-1.5 tablets (5-7.5 mg total) by mouth See admin instructions. TAKE AS DIRECTED BY COUMADIN CLINIC 5 mg on Monday; 7.5 mg all other days  3. Are you having a reaction (difficulty breathing--STAT)? No  4. What is your medication issue?  Pharmacist states that patient is supposed to be getting a prescription for 135 pills. She has 100 of the pills in one manufacturer and 35 in another manufacturer. Is it ok her her to give the patient the prescription that way or does it all need to be under one manufacturer? Please contact.

## 2019-08-02 NOTE — Progress Notes (Signed)
PATIENT: Douglas Walker DOB: 09/13/1925  REASON FOR VISIT: follow up HISTORY FROM: patient  HISTORY OF PRESENT ILLNESS: Today 08/03/19  Douglas Walker is a 85 year old male with history of an essential tremor.  Patient remains on Mysoline, Topamax, and is taking Lyrica for peripheral neuropathy.  He is also on Cymbalta for neuropathy.  He had a pacemaker placed in December 2019. He reports his tremor is the same.  He has tremor to both extremities and his head.  He lives alone but has a Actuary for 12 hours during the day.  On average he sleeps well at night.  He uses a walker.  He has not had any recent falls.  He describes his neuropathy as feeling like he is walking on 2 pegs.  Both of his feet feel numb.  He reports regular follow-up with his primary care doctor.  He seems to tolerate medications well.  He presents today for follow-up accompanied by his son.  HISTORY 02/02/2019 Dr. Jannifer Franklin: Douglas Walker is a 85 year old right-handed white male with history of an essential tremor.  The patient is on Mysoline taking 250 mg twice daily, he also takes Topamax for this, he is on Lyrica for a peripheral neuropathy.  He seems to tolerate the medications fairly well.  The patient is also on Cymbalta in low-dose.  He was just recently in the hospital for pacemaker placement on 04 December 2018.  The patient feels better following the pacemaker placement, his peripheral edema has improved.  The patient complains of some right shoulder pain, he does not sleep well at night at times because of this.  He is not having a lot of troubles with his low back or his peripheral neuropathy discomfort at this point.  He returns to this office for an evaluation.  Usually walks with a walker, he has not had any falls since the pacemaker has been placed.  REVIEW OF SYSTEMS: Out of a complete 14 system review of symptoms, the patient complains only of the following symptoms, and all other reviewed systems are negative.   History of falls, neuropathy, tremor  ALLERGIES: Allergies  Allergen Reactions  . Sulfa Antibiotics Other (See Comments)    Weakness, dizziness    HOME MEDICATIONS: Outpatient Medications Prior to Visit  Medication Sig Dispense Refill  . acetaminophen (TYLENOL) 500 MG tablet Take 1,000 mg by mouth every 6 (six) hours as needed for mild pain.    . beta carotene w/minerals (OCUVITE) tablet Take 1 tablet by mouth daily.     . DULoxetine (CYMBALTA) 20 MG capsule Take 20 mg by mouth 2 (two) times daily.    . furosemide (LASIX) 40 MG tablet Take 60 mg by mouth daily. Take one and a half (1 1/2 tablets (60 mg) by mouth daily.    Marland Kitchen levothyroxine (SYNTHROID) 25 MCG tablet TAKE 1 TABLET (25 MCG TOTAL) BY MOUTH DAILY BEFORE BREAKFAST. 90 tablet 1  . losartan (COZAAR) 50 MG tablet Take 50 mg by mouth daily.  1  . pregabalin (LYRICA) 25 MG capsule Take 1 capsule (25 mg total) by mouth at bedtime. 90 capsule 1  . primidone (MYSOLINE) 250 MG tablet Take 250 mg by mouth 2 (two) times daily.   11  . Tamsulosin HCl (FLOMAX) 0.4 MG CAPS Take 0.4 mg by mouth every evening.     . topiramate (TOPAMAX) 50 MG tablet Take 50 mg by mouth 2 (two) times daily.    Marland Kitchen warfarin (COUMADIN) 5 MG tablet Take 1-1.5  tablets (5-7.5 mg total) by mouth See admin instructions. TAKE AS DIRECTED BY COUMADIN CLINIC 5 mg on Monday; 7.5 mg all other days 135 tablet 0  . metroNIDAZOLE (FLAGYL) 250 MG tablet TK 1 T PO BID FOR 7 DAYS     No facility-administered medications prior to visit.     PAST MEDICAL HISTORY: Past Medical History:  Diagnosis Date  . Chronic low back pain 12/15/2017  . CKD (chronic kidney disease) stage 3, GFR 30-59 ml/min (HCC)   . Coronary artery disease   . Essential tremor 02/28/2016  . GERD (gastroesophageal reflux disease)   . Hernia   . Hyperlipemia   . Hypertension   . Macular degeneration   . Paroxysmal atrial fibrillation ([place]) 10/28/2013  . Presence of permanent cardiac pacemaker 12/04/2018   . Tachycardia-bradycardia syndrome ([place]) 09/13/2014  . TIA (transient ischemic attack)     PAST SURGICAL HISTORY: Past Surgical History:  Procedure Laterality Date  . APPENDECTOMY    . EYE SURGERY    . HERNIA REPAIR    . HERNIA REPAIR    . INSERT / REPLACE / REMOVE PACEMAKER  12/04/2018  . PACEMAKER IMPLANT N/A 12/04/2018   Procedure: PACEMAKER IMPLANT;  Surgeon: Evans Lance, MD;  Location: [facility];  Service: Cardiovascular;  Laterality: N/A;    FAMILY HISTORY: Family History  Problem Relation Age of Onset  . GI problems Mother   . Other Sister        PAIN ISSUES  . Hearing loss Sister   . Blindness Sister   . Heart attack Neg Hx   . Stroke Neg Hx     SOCIAL HISTORY: Social History   Socioeconomic History  . Marital status: Widowed    Spouse name: Not on file  . Number of children: 1  . Years of education: 38  . Highest education level: Not on file  Occupational History    Comment: retired  Scientific laboratory technician  . Financial resource strain: Not on file  . Food insecurity    Worry: Not on file    Inability: Not on file  . Transportation needs    Medical: Not on file    Non-medical: Not on file  Tobacco Use  . Smoking status: Former Smoker    Quit date: 12/30/1962    Years since quitting: 56.6  . Smokeless tobacco: Never Used  Substance and Sexual Activity  . Alcohol use: No  . Drug use: No  . Sexual activity: Not Currently  Lifestyle  . Physical activity    Days per week: Not on file    Minutes per session: Not on file  . Stress: Not on file  Relationships  . Social Herbalist on phone: Not on file    Gets together: Not on file    Attends religious service: Not on file    Active member of club or organization: Not on file    Attends meetings of clubs or organizations: Not on file    Relationship status: Not on file  . Intimate partner violence    Fear of current or ex partner: Not on file    Emotionally abused: Not on file     Physically abused: Not on file    Forced sexual activity: Not on file  Other Topics Concern  . Not on file  Social History Narrative   Lives alone   Right-handed   Caffeine use- caffeine free coffee, occas soda    PHYSICAL EXAM  Vitals:   08/03/19 1257  BP: 118/78  Pulse: 73  Temp: 98.1 F (36.7 C)  SpO2: 98%  Weight: 196 lb 9.6 oz (89.2 kg)  Height: 5\' 9"  (1.753 m)   Body mass index is 29.03 kg/m.  Generalized: Well developed, in no acute distress   Neurological examination  Mentation: Alert oriented to time, place, history taking. Follows all commands speech and language fluent Cranial nerve II-XII:  Extraocular movements were full, visual field were full on confrontational test. Facial sensation and strength were normal. Head turning and shoulder shrug  were normal and symmetric.  A jaw tremor is noted. Motor: The motor testing reveals 5 over 5 strength of all 4 extremities. Good symmetric motor tone is noted throughout.  Sensory: Sensory testing is intact to soft touch on all 4 extremities. No evidence of extinction is noted.  Coordination: Cerebellar testing reveals good finger-nose-finger and heel-to-shin bilaterally.  He has prominent tremor of upper extremities. Gait and station: Gait is wide-based, moderate pace, using walker.  Tandem gait was not attempted Reflexes: Deep tendon reflexes are symmetric and normal bilaterally.   DIAGNOSTIC DATA (LABS, IMAGING, TESTING) - I reviewed patient records, labs, notes, testing and imaging myself where available.  Lab Results  Component Value Date   WBC 4.4 12/02/2018   HGB 11.2 (L) 12/02/2018   HCT 33.8 (L) 12/02/2018   MCV 99 (H) 12/02/2018   PLT 94 (LL) 12/02/2018      Component Value Date/Time   NA 140 01/28/2019 1112   K 3.8 01/28/2019 1112   CL 101 01/28/2019 1112   CO2 25 01/28/2019 1112   GLUCOSE 80 01/28/2019 1112   GLUCOSE 99 06/21/2015 0425   BUN 35 01/28/2019 1112   CREATININE 1.46 (H) 01/28/2019 1112    CALCIUM 9.3 01/28/2019 1112   PROT 6.0 (L) 05/19/2015 0630   ALBUMIN 3.3 (L) 05/19/2015 0630   AST 18 05/19/2015 0630   ALT 16 (L) 05/19/2015 0630   ALKPHOS 73 05/19/2015 0630   BILITOT 0.6 05/19/2015 0630   GFRNONAA 41 (L) 01/28/2019 1112   GFRAA 48 (L) 01/28/2019 1112   No results found for: CHOL, HDL, LDLCALC, LDLDIRECT, TRIG, CHOLHDL No results found for: HGBA1C No results found for: VITAMINB12 Lab Results  Component Value Date   TSH 6.730 (H) 12/02/2018    ASSESSMENT AND PLAN 85 y.o. year old male  has a past medical history of Chronic low back pain (12/15/2017), CKD (chronic kidney disease) stage 3, GFR 30-59 ml/min (HCC), Coronary artery disease, Essential tremor (02/28/2016), GERD (gastroesophageal reflux disease), Hernia, Hyperlipemia, Hypertension, Macular degeneration, Paroxysmal atrial fibrillation ([place]) (10/28/2013), Presence of permanent cardiac pacemaker (12/04/2018), Tachycardia-bradycardia syndrome ([place]) (09/13/2014), and TIA (transient ischemic attack). here with:  1.  Essential tremor 2.  Peripheral neuropathy  The patient is overall doing fairly well.  He will continue on Topamax and Lyrica prescribed by this office.  His tremor has been fairly stable over time.  He will continue on Mysoline and Cymbalta which is prescribed by his primary care doctor.  I will refill Topamax, he does not need a refill Lyrica at this time.  He will follow-up in 1 year or sooner if needed.  I advised him that if symptoms worsen or if he develops any new symptoms he should let us know.   I spent 15 minutes with the patient. 50% of this time was spent discussing his plan of care.   Butler Denmark, AGNP-C, DNP 08/03/2019, 1:31 PM [facility] [id number]  [address] [place], [place] [address] [phone]

## 2019-08-03 ENCOUNTER — Encounter: Payer: Self-pay | Admitting: Neurology

## 2019-08-03 ENCOUNTER — Other Ambulatory Visit: Payer: Self-pay

## 2019-08-03 ENCOUNTER — Ambulatory Visit (INDEPENDENT_AMBULATORY_CARE_PROVIDER_SITE_OTHER): Payer: Medicare Other | Admitting: Neurology

## 2019-08-03 VITALS — BP 118/78 | HR 73 | Temp 98.1°F | Ht 69.0 in | Wt 196.6 lb

## 2019-08-03 DIAGNOSIS — G250 Essential tremor: Secondary | ICD-10-CM | POA: Diagnosis not present

## 2019-08-03 DIAGNOSIS — G609 Hereditary and idiopathic neuropathy, unspecified: Secondary | ICD-10-CM | POA: Diagnosis not present

## 2019-08-03 MED ORDER — TOPIRAMATE 50 MG PO TABS: 50.0000 mg | ORAL_TABLET | Freq: Two times a day (BID) | ORAL | 1 refills | Status: DC

## 2019-08-03 NOTE — Progress Notes (Signed)
I have read the note, and I agree with the clinical assessment and plan.  Charles K Willis   

## 2019-08-03 NOTE — Patient Instructions (Signed)
It was wonderful to meet you today! We will keep the medications the same and not change anything.

## 2019-08-11 ENCOUNTER — Other Ambulatory Visit: Payer: Self-pay

## 2019-08-11 ENCOUNTER — Ambulatory Visit (INDEPENDENT_AMBULATORY_CARE_PROVIDER_SITE_OTHER): Payer: Medicare Other

## 2019-08-11 DIAGNOSIS — Z5181 Encounter for therapeutic drug level monitoring: Secondary | ICD-10-CM

## 2019-08-11 DIAGNOSIS — I480 Paroxysmal atrial fibrillation: Secondary | ICD-10-CM | POA: Diagnosis not present

## 2019-08-11 LAB — POCT INR: INR: 3.5 — AB (ref 2.0–3.0)

## 2019-08-11 NOTE — Patient Instructions (Signed)
Description    Pt's son present. Skip today's dosage of Coumadin, then resume same dosage 1.5 tablets daily except 2 tablets on Mondays.  Recheck INR in 4 weeks. Call # [phone] if placed on any new medications.

## 2019-08-18 ENCOUNTER — Encounter: Payer: Self-pay | Admitting: Podiatry

## 2019-08-18 ENCOUNTER — Other Ambulatory Visit: Payer: Self-pay

## 2019-08-18 ENCOUNTER — Ambulatory Visit (INDEPENDENT_AMBULATORY_CARE_PROVIDER_SITE_OTHER): Payer: Medicare Other | Admitting: Podiatry

## 2019-08-18 VITALS — Temp 97.7°F

## 2019-08-18 DIAGNOSIS — B351 Tinea unguium: Secondary | ICD-10-CM | POA: Diagnosis not present

## 2019-08-18 DIAGNOSIS — E1142 Type 2 diabetes mellitus with diabetic polyneuropathy: Secondary | ICD-10-CM

## 2019-08-18 DIAGNOSIS — M79676 Pain in unspecified toe(s): Secondary | ICD-10-CM | POA: Diagnosis not present

## 2019-08-18 DIAGNOSIS — L608 Other nail disorders: Secondary | ICD-10-CM

## 2019-08-18 DIAGNOSIS — D689 Coagulation defect, unspecified: Secondary | ICD-10-CM

## 2019-08-18 NOTE — Progress Notes (Signed)
Complaint:  Visit Type: Patient returns to my office for continued preventative foot care services. Complaint: Patient states" my nails have grown long and thick and become painful to walk and wear shoes" Patient has been taking coumadin.. The patient presents for preventative foot care services. No changes to ROS.  Patient is taking coumadin.  Podiatric Exam: Vascular: dorsalis pedis and posterior tibial pulses are not  palpable bilateral due tp severe swelling feet.. Capillary return is immediate. Temperature gradient is WNL. Skin turgor WNL  Sensorium: Normal Semmes Weinstein monofilament test. Normal tactile sensation bilaterally. Nail Exam: Pt has thick disfigured discolored nails with subungual debris noted bilateral entire nail hallux through fifth toenails.  Pincer nails hallux  B/L.  Incurvation lateral border right great toenail.  No infection. Ulcer Exam: There is no evidence of ulcer or pre-ulcerative changes or infection. Orthopedic Exam: Muscle tone and strength are WNL. No limitations in general ROM. No crepitus or effusions noted. Foot type and digits show no abnormalities. Bony prominences are unremarkable. Skin: No Porokeratosis. No infection or ulcers  Diagnosis:  Onychomycosis, , Pain in right toe, pain in left toes  Treatment & Plan Procedures and Treatment: Consent by patient was obtained for treatment procedures.   Debridement of mycotic and hypertrophic toenails, 1 through 5 bilateral and clearing of subungual debris. No ulceration, no infection noted.  Return Visit-Office Procedure: Patient instructed to return to the office for a follow up visit 10 weeks  for continued evaluation and treatment.    Gardiner Barefoot DPM

## 2019-08-30 DIAGNOSIS — F322 Major depressive disorder, single episode, severe without psychotic features: Secondary | ICD-10-CM | POA: Diagnosis not present

## 2019-08-30 DIAGNOSIS — I5030 Unspecified diastolic (congestive) heart failure: Secondary | ICD-10-CM | POA: Diagnosis not present

## 2019-08-30 DIAGNOSIS — I2510 Atherosclerotic heart disease of native coronary artery without angina pectoris: Secondary | ICD-10-CM | POA: Diagnosis not present

## 2019-08-30 DIAGNOSIS — N400 Enlarged prostate without lower urinary tract symptoms: Secondary | ICD-10-CM | POA: Diagnosis not present

## 2019-08-30 DIAGNOSIS — I480 Paroxysmal atrial fibrillation: Secondary | ICD-10-CM | POA: Diagnosis not present

## 2019-08-30 DIAGNOSIS — N183 Chronic kidney disease, stage 3 (moderate): Secondary | ICD-10-CM | POA: Diagnosis not present

## 2019-08-30 DIAGNOSIS — F329 Major depressive disorder, single episode, unspecified: Secondary | ICD-10-CM | POA: Diagnosis not present

## 2019-08-30 DIAGNOSIS — I10 Essential (primary) hypertension: Secondary | ICD-10-CM | POA: Diagnosis not present

## 2019-08-30 DIAGNOSIS — E782 Mixed hyperlipidemia: Secondary | ICD-10-CM | POA: Diagnosis not present

## 2019-08-30 DIAGNOSIS — G459 Transient cerebral ischemic attack, unspecified: Secondary | ICD-10-CM | POA: Diagnosis not present

## 2019-08-30 DIAGNOSIS — I5032 Chronic diastolic (congestive) heart failure: Secondary | ICD-10-CM | POA: Diagnosis not present

## 2019-08-30 DIAGNOSIS — I4891 Unspecified atrial fibrillation: Secondary | ICD-10-CM | POA: Diagnosis not present

## 2019-09-04 ENCOUNTER — Other Ambulatory Visit: Payer: Self-pay | Admitting: Neurology

## 2019-09-08 ENCOUNTER — Ambulatory Visit (INDEPENDENT_AMBULATORY_CARE_PROVIDER_SITE_OTHER): Payer: Medicare Other | Admitting: *Deleted

## 2019-09-08 ENCOUNTER — Other Ambulatory Visit: Payer: Self-pay

## 2019-09-08 DIAGNOSIS — Z5181 Encounter for therapeutic drug level monitoring: Secondary | ICD-10-CM

## 2019-09-08 DIAGNOSIS — I480 Paroxysmal atrial fibrillation: Secondary | ICD-10-CM | POA: Diagnosis not present

## 2019-09-08 LAB — POCT INR: INR: 3.2 — AB (ref 2.0–3.0)

## 2019-09-08 NOTE — Patient Instructions (Signed)
Description   Today take 1 tablet then start taking 1.5 tablets daily.  Recheck INR in 3 weeks. Call # [phone] if placed on any new medications.

## 2019-09-20 DIAGNOSIS — G629 Polyneuropathy, unspecified: Secondary | ICD-10-CM | POA: Diagnosis not present

## 2019-09-20 DIAGNOSIS — N644 Mastodynia: Secondary | ICD-10-CM | POA: Diagnosis not present

## 2019-09-20 DIAGNOSIS — N183 Chronic kidney disease, stage 3 (moderate): Secondary | ICD-10-CM | POA: Diagnosis not present

## 2019-09-20 DIAGNOSIS — I672 Cerebral atherosclerosis: Secondary | ICD-10-CM | POA: Diagnosis not present

## 2019-09-20 DIAGNOSIS — E039 Hypothyroidism, unspecified: Secondary | ICD-10-CM | POA: Diagnosis not present

## 2019-09-20 DIAGNOSIS — I2510 Atherosclerotic heart disease of native coronary artery without angina pectoris: Secondary | ICD-10-CM | POA: Diagnosis not present

## 2019-09-20 DIAGNOSIS — D6869 Other thrombophilia: Secondary | ICD-10-CM | POA: Diagnosis not present

## 2019-09-20 DIAGNOSIS — Z1389 Encounter for screening for other disorder: Secondary | ICD-10-CM | POA: Diagnosis not present

## 2019-09-20 DIAGNOSIS — I480 Paroxysmal atrial fibrillation: Secondary | ICD-10-CM | POA: Diagnosis not present

## 2019-09-20 DIAGNOSIS — I5030 Unspecified diastolic (congestive) heart failure: Secondary | ICD-10-CM | POA: Diagnosis not present

## 2019-09-20 DIAGNOSIS — Z23 Encounter for immunization: Secondary | ICD-10-CM | POA: Diagnosis not present

## 2019-09-20 DIAGNOSIS — I10 Essential (primary) hypertension: Secondary | ICD-10-CM | POA: Diagnosis not present

## 2019-09-20 DIAGNOSIS — Z79899 Other long term (current) drug therapy: Secondary | ICD-10-CM | POA: Diagnosis not present

## 2019-09-20 DIAGNOSIS — Z0000 Encounter for general adult medical examination without abnormal findings: Secondary | ICD-10-CM | POA: Diagnosis not present

## 2019-09-27 ENCOUNTER — Other Ambulatory Visit: Payer: Self-pay

## 2019-09-27 ENCOUNTER — Ambulatory Visit (INDEPENDENT_AMBULATORY_CARE_PROVIDER_SITE_OTHER): Payer: Medicare Other | Admitting: *Deleted

## 2019-09-27 DIAGNOSIS — Z5181 Encounter for therapeutic drug level monitoring: Secondary | ICD-10-CM

## 2019-09-27 DIAGNOSIS — I480 Paroxysmal atrial fibrillation: Secondary | ICD-10-CM | POA: Diagnosis not present

## 2019-09-27 LAB — POCT INR: INR: 2.1 (ref 2.0–3.0)

## 2019-09-27 NOTE — Patient Instructions (Signed)
Description   Continue taking 1.5 tablets daily.  Recheck INR in 3 weeks. Call # [phone] if placed on any new medications.

## 2019-09-28 DIAGNOSIS — F329 Major depressive disorder, single episode, unspecified: Secondary | ICD-10-CM | POA: Diagnosis not present

## 2019-09-28 DIAGNOSIS — I4891 Unspecified atrial fibrillation: Secondary | ICD-10-CM | POA: Diagnosis not present

## 2019-09-28 DIAGNOSIS — E039 Hypothyroidism, unspecified: Secondary | ICD-10-CM | POA: Diagnosis not present

## 2019-09-28 DIAGNOSIS — G459 Transient cerebral ischemic attack, unspecified: Secondary | ICD-10-CM | POA: Diagnosis not present

## 2019-09-28 DIAGNOSIS — N400 Enlarged prostate without lower urinary tract symptoms: Secondary | ICD-10-CM | POA: Diagnosis not present

## 2019-09-28 DIAGNOSIS — N183 Chronic kidney disease, stage 3 (moderate): Secondary | ICD-10-CM | POA: Diagnosis not present

## 2019-09-28 DIAGNOSIS — I5030 Unspecified diastolic (congestive) heart failure: Secondary | ICD-10-CM | POA: Diagnosis not present

## 2019-09-28 DIAGNOSIS — I480 Paroxysmal atrial fibrillation: Secondary | ICD-10-CM | POA: Diagnosis not present

## 2019-09-28 DIAGNOSIS — I2510 Atherosclerotic heart disease of native coronary artery without angina pectoris: Secondary | ICD-10-CM | POA: Diagnosis not present

## 2019-09-28 DIAGNOSIS — I5032 Chronic diastolic (congestive) heart failure: Secondary | ICD-10-CM | POA: Diagnosis not present

## 2019-09-28 DIAGNOSIS — I10 Essential (primary) hypertension: Secondary | ICD-10-CM | POA: Diagnosis not present

## 2019-09-28 DIAGNOSIS — F322 Major depressive disorder, single episode, severe without psychotic features: Secondary | ICD-10-CM | POA: Diagnosis not present

## 2019-10-08 ENCOUNTER — Ambulatory Visit (INDEPENDENT_AMBULATORY_CARE_PROVIDER_SITE_OTHER): Payer: Medicare Other | Admitting: *Deleted

## 2019-10-08 DIAGNOSIS — I495 Sick sinus syndrome: Secondary | ICD-10-CM | POA: Diagnosis not present

## 2019-10-08 DIAGNOSIS — I442 Atrioventricular block, complete: Secondary | ICD-10-CM

## 2019-10-08 LAB — CUP PACEART REMOTE DEVICE CHECK
Battery Remaining Longevity: 49 mo
Battery Voltage: 2.96 V
Brady Statistic AP VP Percent: 99.95 %
Brady Statistic AP VS Percent: 0 %
Brady Statistic AS VP Percent: 0 %
Brady Statistic AS VS Percent: 0.05 %
Brady Statistic RA Percent Paced: 99.97 %
Brady Statistic RV Percent Paced: 99.95 %
Date Time Interrogation Session: 20201009073318
Implantable Lead Implant Date: 20191206
Implantable Lead Implant Date: 20191206
Implantable Lead Location: 753860
Implantable Lead Location: 753860
Implantable Lead Model: 3830
Implantable Lead Model: 5076
Implantable Pulse Generator Implant Date: 20191206
Lead Channel Impedance Value: 285 Ohm
Lead Channel Impedance Value: 361 Ohm
Lead Channel Impedance Value: 399 Ohm
Lead Channel Impedance Value: 475 Ohm
Lead Channel Sensing Intrinsic Amplitude: 2 mV
Lead Channel Sensing Intrinsic Amplitude: 6.75 mV
Lead Channel Sensing Intrinsic Amplitude: 6.75 mV
Lead Channel Setting Pacing Amplitude: 3.5 V
Lead Channel Setting Pacing Amplitude: 3.5 V
Lead Channel Setting Pacing Pulse Width: 0.4 ms
Lead Channel Setting Sensing Sensitivity: 1.2 mV

## 2019-10-18 ENCOUNTER — Other Ambulatory Visit: Payer: Self-pay | Admitting: Pharmacist

## 2019-10-18 ENCOUNTER — Other Ambulatory Visit: Payer: Self-pay

## 2019-10-18 ENCOUNTER — Ambulatory Visit (INDEPENDENT_AMBULATORY_CARE_PROVIDER_SITE_OTHER): Payer: Medicare Other | Admitting: *Deleted

## 2019-10-18 DIAGNOSIS — Z5181 Encounter for therapeutic drug level monitoring: Secondary | ICD-10-CM

## 2019-10-18 DIAGNOSIS — I480 Paroxysmal atrial fibrillation: Secondary | ICD-10-CM | POA: Diagnosis not present

## 2019-10-18 LAB — POCT INR: INR: 1.7 — AB (ref 2.0–3.0)

## 2019-10-18 MED ORDER — WARFARIN SODIUM 5 MG PO TABS: ORAL_TABLET | ORAL | 0 refills | Status: DC

## 2019-10-18 NOTE — Patient Instructions (Signed)
Description    Start taking 1.5 tablets daily except for 2 tablets on Monday. Recheck INR in 2 weeks. Call # [phone] if placed on any new medications.

## 2019-10-20 NOTE — Progress Notes (Signed)
Remote pacemaker transmission.   

## 2019-10-25 ENCOUNTER — Other Ambulatory Visit: Payer: Self-pay

## 2019-10-25 DIAGNOSIS — Z20822 Contact with and (suspected) exposure to covid-19: Secondary | ICD-10-CM

## 2019-10-26 LAB — NOVEL CORONAVIRUS, NAA: SARS-CoV-2, NAA: NOT DETECTED

## 2019-10-29 ENCOUNTER — Encounter: Payer: Self-pay | Admitting: Podiatry

## 2019-10-29 ENCOUNTER — Other Ambulatory Visit: Payer: Self-pay

## 2019-10-29 ENCOUNTER — Ambulatory Visit (INDEPENDENT_AMBULATORY_CARE_PROVIDER_SITE_OTHER): Payer: Medicare Other | Admitting: Podiatry

## 2019-10-29 DIAGNOSIS — L608 Other nail disorders: Secondary | ICD-10-CM

## 2019-10-29 DIAGNOSIS — D689 Coagulation defect, unspecified: Secondary | ICD-10-CM

## 2019-10-29 DIAGNOSIS — M79676 Pain in unspecified toe(s): Secondary | ICD-10-CM | POA: Diagnosis not present

## 2019-10-29 DIAGNOSIS — B351 Tinea unguium: Secondary | ICD-10-CM | POA: Diagnosis not present

## 2019-10-29 DIAGNOSIS — E1142 Type 2 diabetes mellitus with diabetic polyneuropathy: Secondary | ICD-10-CM

## 2019-10-29 NOTE — Progress Notes (Signed)
Complaint:  Visit Type: Patient returns to my office for continued preventative foot care services. Complaint: Patient states" my nails have grown long and thick and become painful to walk and wear shoes" Patient has been taking coumadin.. The patient presents for preventative foot care services. No changes to ROS.  Patient is taking coumadin.  Podiatric Exam: Vascular: dorsalis pedis and posterior tibial pulses are not  palpable bilateral due tp severe swelling feet.. Capillary return is immediate. Temperature gradient is WNL. Skin turgor WNL  Sensorium: Normal Semmes Weinstein monofilament test. Normal tactile sensation bilaterally. Nail Exam: Pt has thick disfigured discolored nails with subungual debris noted bilateral entire nail hallux through fifth toenails.  Pincer nails hallux  B/L.  Incurvation lateral border right great toenail.  No infection. Ulcer Exam: There is no evidence of ulcer or pre-ulcerative changes or infection. Orthopedic Exam: Muscle tone and strength are WNL. No limitations in general ROM. No crepitus or effusions noted. Foot type and digits show no abnormalities. Bony prominences are unremarkable. Skin: No Porokeratosis. No infection or ulcers  Diagnosis:  Onychomycosis, , Pain in right toe, pain in left toes  Treatment & Plan Procedures and Treatment: Consent by patient was obtained for treatment procedures.   Debridement of mycotic and hypertrophic toenails, 1 through 5 bilateral and clearing of subungual debris. No ulceration, no infection noted.  Return Visit-Office Procedure: Patient instructed to return to the office for a follow up visit 10 weeks  for continued evaluation and treatment.    Gerrod Maule DPM 

## 2019-11-01 ENCOUNTER — Other Ambulatory Visit: Payer: Self-pay

## 2019-11-01 ENCOUNTER — Ambulatory Visit (INDEPENDENT_AMBULATORY_CARE_PROVIDER_SITE_OTHER): Payer: Medicare Other | Admitting: *Deleted

## 2019-11-01 DIAGNOSIS — I480 Paroxysmal atrial fibrillation: Secondary | ICD-10-CM | POA: Diagnosis not present

## 2019-11-01 DIAGNOSIS — Z5181 Encounter for therapeutic drug level monitoring: Secondary | ICD-10-CM

## 2019-11-01 LAB — POCT INR: INR: 2.4 (ref 2.0–3.0)

## 2019-11-01 NOTE — Patient Instructions (Signed)
Description   Continue taking 1.5 tablets daily except for 2 tablets on Monday. Recheck INR in 4 weeks. Call # [phone] if placed on any new medications.

## 2019-11-26 DIAGNOSIS — F329 Major depressive disorder, single episode, unspecified: Secondary | ICD-10-CM | POA: Diagnosis not present

## 2019-11-26 DIAGNOSIS — I10 Essential (primary) hypertension: Secondary | ICD-10-CM | POA: Diagnosis not present

## 2019-11-26 DIAGNOSIS — I4891 Unspecified atrial fibrillation: Secondary | ICD-10-CM | POA: Diagnosis not present

## 2019-11-26 DIAGNOSIS — G459 Transient cerebral ischemic attack, unspecified: Secondary | ICD-10-CM | POA: Diagnosis not present

## 2019-11-26 DIAGNOSIS — E039 Hypothyroidism, unspecified: Secondary | ICD-10-CM | POA: Diagnosis not present

## 2019-11-26 DIAGNOSIS — F322 Major depressive disorder, single episode, severe without psychotic features: Secondary | ICD-10-CM | POA: Diagnosis not present

## 2019-11-26 DIAGNOSIS — N400 Enlarged prostate without lower urinary tract symptoms: Secondary | ICD-10-CM | POA: Diagnosis not present

## 2019-11-26 DIAGNOSIS — I480 Paroxysmal atrial fibrillation: Secondary | ICD-10-CM | POA: Diagnosis not present

## 2019-11-26 DIAGNOSIS — I5030 Unspecified diastolic (congestive) heart failure: Secondary | ICD-10-CM | POA: Diagnosis not present

## 2019-11-26 DIAGNOSIS — N1831 Chronic kidney disease, stage 3a: Secondary | ICD-10-CM | POA: Diagnosis not present

## 2019-11-26 DIAGNOSIS — E782 Mixed hyperlipidemia: Secondary | ICD-10-CM | POA: Diagnosis not present

## 2019-11-29 ENCOUNTER — Ambulatory Visit (INDEPENDENT_AMBULATORY_CARE_PROVIDER_SITE_OTHER): Payer: Medicare Other | Admitting: *Deleted

## 2019-11-29 ENCOUNTER — Other Ambulatory Visit: Payer: Self-pay

## 2019-11-29 DIAGNOSIS — Z5181 Encounter for therapeutic drug level monitoring: Secondary | ICD-10-CM | POA: Diagnosis not present

## 2019-11-29 DIAGNOSIS — I480 Paroxysmal atrial fibrillation: Secondary | ICD-10-CM | POA: Diagnosis not present

## 2019-11-29 LAB — POCT INR: INR: 2 (ref 2.0–3.0)

## 2019-11-29 NOTE — Patient Instructions (Addendum)
Description   Continue taking 1.5 tablets daily except for 2 tablets on Monday. Recheck INR in 5 weeks. Call # [phone] if placed on any new medications.

## 2020-01-03 ENCOUNTER — Other Ambulatory Visit: Payer: Self-pay

## 2020-01-03 ENCOUNTER — Ambulatory Visit (INDEPENDENT_AMBULATORY_CARE_PROVIDER_SITE_OTHER): Payer: Medicare Other

## 2020-01-03 DIAGNOSIS — Z5181 Encounter for therapeutic drug level monitoring: Secondary | ICD-10-CM | POA: Diagnosis not present

## 2020-01-03 DIAGNOSIS — I480 Paroxysmal atrial fibrillation: Secondary | ICD-10-CM

## 2020-01-03 LAB — POCT INR: INR: 3.1 — AB (ref 2.0–3.0)

## 2020-01-03 NOTE — Patient Instructions (Signed)
Description   Take 1 tablet today, then resume same dosage 1.5 tablets daily except for 2 tablets on Mondays. Recheck INR in 5 weeks. Call # [phone] if placed on any new medications.

## 2020-01-07 ENCOUNTER — Ambulatory Visit (INDEPENDENT_AMBULATORY_CARE_PROVIDER_SITE_OTHER): Payer: Medicare Other | Admitting: *Deleted

## 2020-01-07 DIAGNOSIS — I495 Sick sinus syndrome: Secondary | ICD-10-CM | POA: Diagnosis not present

## 2020-01-07 LAB — CUP PACEART REMOTE DEVICE CHECK
Battery Remaining Longevity: 45 mo
Battery Voltage: 2.95 V
Brady Statistic AP VP Percent: 99.96 %
Brady Statistic AP VS Percent: 0 %
Brady Statistic AS VP Percent: 0 %
Brady Statistic AS VS Percent: 0.04 %
Brady Statistic RA Percent Paced: 99.97 %
Brady Statistic RV Percent Paced: 99.96 %
Date Time Interrogation Session: 20210107224500
Implantable Lead Implant Date: 20191206
Implantable Lead Implant Date: 20191206
Implantable Lead Location: 753860
Implantable Lead Location: 753860
Implantable Lead Model: 3830
Implantable Lead Model: 5076
Implantable Pulse Generator Implant Date: 20191206
Lead Channel Impedance Value: 285 Ohm
Lead Channel Impedance Value: 361 Ohm
Lead Channel Impedance Value: 399 Ohm
Lead Channel Impedance Value: 456 Ohm
Lead Channel Sensing Intrinsic Amplitude: 2 mV
Lead Channel Sensing Intrinsic Amplitude: 6.75 mV
Lead Channel Sensing Intrinsic Amplitude: 6.75 mV
Lead Channel Setting Pacing Amplitude: 3.5 V
Lead Channel Setting Pacing Amplitude: 3.5 V
Lead Channel Setting Pacing Pulse Width: 0.4 ms
Lead Channel Setting Sensing Sensitivity: 1.2 mV

## 2020-01-12 ENCOUNTER — Ambulatory Visit (INDEPENDENT_AMBULATORY_CARE_PROVIDER_SITE_OTHER): Payer: Medicare Other | Admitting: Podiatry

## 2020-01-12 ENCOUNTER — Other Ambulatory Visit: Payer: Self-pay

## 2020-01-12 ENCOUNTER — Encounter: Payer: Self-pay | Admitting: Podiatry

## 2020-01-12 DIAGNOSIS — E1142 Type 2 diabetes mellitus with diabetic polyneuropathy: Secondary | ICD-10-CM | POA: Diagnosis not present

## 2020-01-12 DIAGNOSIS — L608 Other nail disorders: Secondary | ICD-10-CM

## 2020-01-12 DIAGNOSIS — B351 Tinea unguium: Secondary | ICD-10-CM | POA: Diagnosis not present

## 2020-01-12 DIAGNOSIS — D689 Coagulation defect, unspecified: Secondary | ICD-10-CM | POA: Diagnosis not present

## 2020-01-12 DIAGNOSIS — M79676 Pain in unspecified toe(s): Secondary | ICD-10-CM

## 2020-01-12 NOTE — Progress Notes (Signed)
Complaint:  Visit Type: Patient returns to my office for continued preventative foot care services. Complaint: Patient states" my nails have grown long and thick and become painful to walk and wear shoes" Patient has been taking coumadin.. The patient presents for preventative foot care services. No changes to ROS.  Patient is taking coumadin.  Podiatric Exam: Vascular: dorsalis pedis and posterior tibial pulses are not  palpable bilateral due tp severe swelling feet.. Capillary return is immediate. Temperature gradient is WNL. Skin turgor WNL  Sensorium: Normal Semmes Weinstein monofilament test. Normal tactile sensation bilaterally. Nail Exam: Pt has thick disfigured discolored nails with subungual debris noted bilateral entire nail hallux through fifth toenails.  Pincer nails hallux  B/L.  Incurvation lateral border right great toenail.  No infection. Ulcer Exam: There is no evidence of ulcer or pre-ulcerative changes or infection. Orthopedic Exam: Muscle tone and strength are WNL. No limitations in general ROM. No crepitus or effusions noted. Foot type and digits show no abnormalities. Bony prominences are unremarkable. Skin: No Porokeratosis. No infection or ulcers  Diagnosis:  Onychomycosis, , Pain in right toe, pain in left toes  Treatment & Plan Procedures and Treatment: Consent by patient was obtained for treatment procedures.   Debridement of mycotic and hypertrophic toenails, 1 through 5 bilateral and clearing of subungual debris. No ulceration, no infection noted.  Return Visit-Office Procedure: Patient instructed to return to the office for a follow up visit 10 weeks  for continued evaluation and treatment.    Gardiner Barefoot DPM

## 2020-01-24 ENCOUNTER — Other Ambulatory Visit: Payer: Self-pay | Admitting: *Deleted

## 2020-01-24 MED ORDER — WARFARIN SODIUM 5 MG PO TABS: ORAL_TABLET | ORAL | 0 refills | Status: DC

## 2020-01-26 DIAGNOSIS — F322 Major depressive disorder, single episode, severe without psychotic features: Secondary | ICD-10-CM | POA: Diagnosis not present

## 2020-01-26 DIAGNOSIS — N1831 Chronic kidney disease, stage 3a: Secondary | ICD-10-CM | POA: Diagnosis not present

## 2020-01-26 DIAGNOSIS — N400 Enlarged prostate without lower urinary tract symptoms: Secondary | ICD-10-CM | POA: Diagnosis not present

## 2020-01-26 DIAGNOSIS — G459 Transient cerebral ischemic attack, unspecified: Secondary | ICD-10-CM | POA: Diagnosis not present

## 2020-01-26 DIAGNOSIS — I5032 Chronic diastolic (congestive) heart failure: Secondary | ICD-10-CM | POA: Diagnosis not present

## 2020-01-26 DIAGNOSIS — I480 Paroxysmal atrial fibrillation: Secondary | ICD-10-CM | POA: Diagnosis not present

## 2020-01-26 DIAGNOSIS — I5030 Unspecified diastolic (congestive) heart failure: Secondary | ICD-10-CM | POA: Diagnosis not present

## 2020-01-26 DIAGNOSIS — F329 Major depressive disorder, single episode, unspecified: Secondary | ICD-10-CM | POA: Diagnosis not present

## 2020-01-26 DIAGNOSIS — I10 Essential (primary) hypertension: Secondary | ICD-10-CM | POA: Diagnosis not present

## 2020-01-26 DIAGNOSIS — I4891 Unspecified atrial fibrillation: Secondary | ICD-10-CM | POA: Diagnosis not present

## 2020-01-26 DIAGNOSIS — E782 Mixed hyperlipidemia: Secondary | ICD-10-CM | POA: Diagnosis not present

## 2020-01-26 DIAGNOSIS — E039 Hypothyroidism, unspecified: Secondary | ICD-10-CM | POA: Diagnosis not present

## 2020-02-07 ENCOUNTER — Ambulatory Visit (INDEPENDENT_AMBULATORY_CARE_PROVIDER_SITE_OTHER): Payer: Medicare Other | Admitting: *Deleted

## 2020-02-07 ENCOUNTER — Other Ambulatory Visit: Payer: Self-pay

## 2020-02-07 ENCOUNTER — Other Ambulatory Visit: Payer: Self-pay | Admitting: *Deleted

## 2020-02-07 DIAGNOSIS — I480 Paroxysmal atrial fibrillation: Secondary | ICD-10-CM | POA: Diagnosis not present

## 2020-02-07 DIAGNOSIS — Z5181 Encounter for therapeutic drug level monitoring: Secondary | ICD-10-CM | POA: Diagnosis not present

## 2020-02-07 LAB — POCT INR: INR: 3.4 — AB (ref 2.0–3.0)

## 2020-02-07 MED ORDER — TOPIRAMATE 50 MG PO TABS: 50.0000 mg | ORAL_TABLET | Freq: Two times a day (BID) | ORAL | 1 refills | Status: DC

## 2020-02-07 NOTE — Patient Instructions (Signed)
Description   Hold today and then start taking 1.5 tablets everyday. Recheck INR in 3 weeks. Call # [phone] if placed on any new medications.

## 2020-02-23 DIAGNOSIS — I10 Essential (primary) hypertension: Secondary | ICD-10-CM | POA: Diagnosis not present

## 2020-02-23 DIAGNOSIS — E039 Hypothyroidism, unspecified: Secondary | ICD-10-CM | POA: Diagnosis not present

## 2020-02-23 DIAGNOSIS — I4891 Unspecified atrial fibrillation: Secondary | ICD-10-CM | POA: Diagnosis not present

## 2020-02-23 DIAGNOSIS — I480 Paroxysmal atrial fibrillation: Secondary | ICD-10-CM | POA: Diagnosis not present

## 2020-02-23 DIAGNOSIS — G459 Transient cerebral ischemic attack, unspecified: Secondary | ICD-10-CM | POA: Diagnosis not present

## 2020-02-23 DIAGNOSIS — I5030 Unspecified diastolic (congestive) heart failure: Secondary | ICD-10-CM | POA: Diagnosis not present

## 2020-02-23 DIAGNOSIS — N183 Chronic kidney disease, stage 3 unspecified: Secondary | ICD-10-CM | POA: Diagnosis not present

## 2020-02-23 DIAGNOSIS — N400 Enlarged prostate without lower urinary tract symptoms: Secondary | ICD-10-CM | POA: Diagnosis not present

## 2020-02-23 DIAGNOSIS — F322 Major depressive disorder, single episode, severe without psychotic features: Secondary | ICD-10-CM | POA: Diagnosis not present

## 2020-02-23 DIAGNOSIS — F329 Major depressive disorder, single episode, unspecified: Secondary | ICD-10-CM | POA: Diagnosis not present

## 2020-02-23 DIAGNOSIS — E782 Mixed hyperlipidemia: Secondary | ICD-10-CM | POA: Diagnosis not present

## 2020-02-27 NOTE — Progress Notes (Signed)
Cardiology Office Note:    Date:  02/28/2020   ID:  Douglas Walker, DOB 02/10/1925, MRN [id number]  PCP:  Josetta Huddle, MD  Cardiologist:  Sinclair Grooms, MD   Referring MD: Josetta Huddle, MD   Chief Complaint  Patient presents with  . Atrial Fibrillation  . Coronary Artery Disease    History of Present Illness:    Douglas Walker is a 86 y.o. male with a hx of tachycardia-bradycardia syndrome per 30 day monitor, history of recurring syncopal episodes, history of paroxysmal atrial fibrillation, chronic anticoagulation therapy, prior history of TIA,coronary artery disease, and PPM therapy 11/24/2018.  He is here today, being brought in by his son.  He has no particular cardiac complaints.  Specifically, he denies angina, orthopnea, PND, peripheral edema, and transient neurological symptoms.  He has not had blood in his urine or stool.  He has not had transient neurological symptoms.   Past Medical History:  Diagnosis Date  . Chronic low back pain 12/15/2017  . CKD (chronic kidney disease) stage 3, GFR 30-59 ml/min   . Coronary artery disease   . Essential tremor 02/28/2016  . GERD (gastroesophageal reflux disease)   . Hernia   . Hyperlipemia   . Hypertension   . Macular degeneration   . Paroxysmal atrial fibrillation ([place]) 10/28/2013  . Presence of permanent cardiac pacemaker 12/04/2018  . Tachycardia-bradycardia syndrome ([place]) 09/13/2014  . TIA (transient ischemic attack)     Past Surgical History:  Procedure Laterality Date  . APPENDECTOMY    . EYE SURGERY    . HERNIA REPAIR    . HERNIA REPAIR    . INSERT / REPLACE / REMOVE PACEMAKER  12/04/2018  . PACEMAKER IMPLANT N/A 12/04/2018   Procedure: PACEMAKER IMPLANT;  Surgeon: Evans Lance, MD;  Location: [facility];  Service: Cardiovascular;  Laterality: N/A;    Current Medications: Current Meds  Medication Sig  . acetaminophen (TYLENOL) 500 MG tablet Take 1,000 mg by mouth every 6 (six) hours as  needed for mild pain.  . beta carotene w/minerals (OCUVITE) tablet Take 1 tablet by mouth daily.   . DULoxetine (CYMBALTA) 20 MG capsule Take 20 mg by mouth 2 (two) times daily.  . furosemide (LASIX) 20 MG tablet Take 60 mg by mouth daily.  Marland Kitchen levothyroxine (SYNTHROID) 25 MCG tablet TAKE 1 TABLET (25 MCG TOTAL) BY MOUTH DAILY BEFORE BREAKFAST.  Marland Kitchen losartan (COZAAR) 50 MG tablet Take 50 mg by mouth daily.  . pregabalin (LYRICA) 25 MG capsule TAKE 1 CAPSULE(25 MG) BY MOUTH AT BEDTIME  . primidone (MYSOLINE) 250 MG tablet Take 250 mg by mouth 2 (two) times daily.   . Tamsulosin HCl (FLOMAX) 0.4 MG CAPS Take 0.4 mg by mouth every evening.   . topiramate (TOPAMAX) 50 MG tablet Take 1 tablet (50 mg total) by mouth 2 (two) times daily.  . traMADol (ULTRAM) 50 MG tablet TK 1 T PO Q 6 HOURS PRN FOR 30 DAYS  . warfarin (COUMADIN) 5 MG tablet Take 1.5 to 2 tablets daily as directed by Coumadin clinic     Allergies:   Sulfa antibiotics   Social History   Socioeconomic History  . Marital status: Widowed    Spouse name: Not on file  . Number of children: 1  . Years of education: 71  . Highest education level: Not on file  Occupational History    Comment: retired  Tobacco Use  . Smoking status: Former Smoker  Quit date: 12/30/1962    Years since quitting: 57.2  . Smokeless tobacco: Never Used  Substance and Sexual Activity  . Alcohol use: No  . Drug use: No  . Sexual activity: Not Currently  Other Topics Concern  . Not on file  Social History Narrative   Lives alone   Right-handed   Caffeine use- caffeine free coffee, occas soda   Social Determinants of Health   Financial Resource Strain:   . Difficulty of Paying Living Expenses: Not on file  Food Insecurity:   . Worried About Charity fundraiser in the Last Year: Not on file  . Ran Out of Food in the Last Year: Not on file  Transportation Needs:   . Lack of Transportation (Medical): Not on file  . Lack of Transportation  (Non-Medical): Not on file  Physical Activity:   . Days of Exercise per Week: Not on file  . Minutes of Exercise per Session: Not on file  Stress:   . Feeling of Stress : Not on file  Social Connections:   . Frequency of Communication with Friends and Family: Not on file  . Frequency of Social Gatherings with Friends and Family: Not on file  . Attends Religious Services: Not on file  . Active Member of Clubs or Organizations: Not on file  . Attends Archivist Meetings: Not on file  . Marital Status: Not on file     Family History: The patient's family history includes Blindness in his sister; GI problems in his mother; Hearing loss in his sister; Other in his sister. There is no history of Heart attack or Stroke.  ROS:   Please see the history of present illness.    Has good appetite.  He is not as strong as he would like to be.  He walks using a walker.  He has not had falls or trauma.  All other systems reviewed and are negative.  EKGs/Labs/Other Studies Reviewed:    The following studies were reviewed today: Reviewed pacemaker clinic records and no significant abnormalities are noted.  EKG:  EKG atrial fibrillation background with ventricular pacing at 70 bpm.  Recent Labs: No results found for requested labs within last 8760 hours.  Recent Lipid Panel No results found for: CHOL, TRIG, HDL, CHOLHDL, VLDL, LDLCALC, LDLDIRECT  Physical Exam:    VS:  BP 124/64   Pulse 70   Ht 5\' 9"  (1.753 m)   Wt 198 lb 1.9 oz (89.9 kg)   SpO2 97%   BMI 29.26 kg/m     Wt Readings from Last 3 Encounters:  02/28/20 198 lb 1.9 oz (89.9 kg)  08/03/19 196 lb 9.6 oz (89.2 kg)  02/02/19 193 lb (87.5 kg)     GEN: Appears younger than stated age. No acute distress HEENT: Normal NECK: No JVD. LYMPHATICS: No lymphadenopathy CARDIAC:  RRR without murmur, gallop, or edema. VASCULAR:  Normal Pulses. No bruits. RESPIRATORY:  Clear to auscultation without rales, wheezing or rhonchi    ABDOMEN: Soft, non-tender, non-distended, No pulsatile mass, MUSCULOSKELETAL: No deformity  SKIN: Warm and dry NEUROLOGIC:  Alert and oriented x 3 PSYCHIATRIC:  Normal affect   ASSESSMENT:    1. Paroxysmal atrial fibrillation (HCC)   2. Sick sinus syndrome ([place])   3. Complete heart block ([place])   4. Essential hypertension   5. Chronic diastolic CHF (congestive heart failure) ([place])   6. Chronic anticoagulation   7. Pacemaker   8. Educated about COVID-19 virus infection  PLAN:    In order of problems listed above:  1. Continuous atrial fibrillation with pacing at 70 bpm 2. No syncope or palpitations 3. Being treated with pacemaker 4. Excellent blood pressure 5. No evidence of volume overload.  Continue 60 mg of furosemide. 6. Continue Coumadin.  INR checked today. 7. He will continue to follow in the pacemaker clinic. 8. 3W's is being practiced.  He has not yet gotten the vaccine.  His son whom he depends upon to get him from place to place is trying to coordinate his shop with his father shot.  The son is not quite 72 but wants him to get it at the same time.  Hopefully the 55 and above group will become eligible by April.  I did encourage an earlier vaccine for Mr. Baldassari.  Overall education and awareness concerning primary/secondary risk prevention was discussed in detail: LDL less than 70, hemoglobin A1c less than 7, blood pressure target less than 130/80 mmHg, >150 minutes of moderate aerobic activity per week, avoidance of smoking, weight control (via diet and exercise), and continued surveillance/management of/for obstructive sleep apnea.    Medication Adjustments/Labs and Tests Ordered: Current medicines are reviewed at length with the patient today.  Concerns regarding medicines are outlined above.  Orders Placed This Encounter  Procedures  . EKG 12-Lead   No orders of the defined types were placed in this encounter.   There are no Patient Instructions on file  for this visit.   Signed, Sinclair Grooms, MD  02/28/2020 8:54 AM    [place]

## 2020-02-28 ENCOUNTER — Ambulatory Visit (INDEPENDENT_AMBULATORY_CARE_PROVIDER_SITE_OTHER): Payer: Medicare Other | Admitting: Interventional Cardiology

## 2020-02-28 ENCOUNTER — Other Ambulatory Visit: Payer: Self-pay

## 2020-02-28 ENCOUNTER — Encounter: Payer: Self-pay | Admitting: Interventional Cardiology

## 2020-02-28 ENCOUNTER — Ambulatory Visit (INDEPENDENT_AMBULATORY_CARE_PROVIDER_SITE_OTHER): Payer: Medicare Other | Admitting: Pharmacist

## 2020-02-28 VITALS — BP 124/64 | HR 70 | Ht 69.0 in | Wt 198.1 lb

## 2020-02-28 DIAGNOSIS — Z5181 Encounter for therapeutic drug level monitoring: Secondary | ICD-10-CM | POA: Diagnosis not present

## 2020-02-28 DIAGNOSIS — Z7901 Long term (current) use of anticoagulants: Secondary | ICD-10-CM

## 2020-02-28 DIAGNOSIS — I442 Atrioventricular block, complete: Secondary | ICD-10-CM

## 2020-02-28 DIAGNOSIS — I10 Essential (primary) hypertension: Secondary | ICD-10-CM

## 2020-02-28 DIAGNOSIS — I5032 Chronic diastolic (congestive) heart failure: Secondary | ICD-10-CM

## 2020-02-28 DIAGNOSIS — I495 Sick sinus syndrome: Secondary | ICD-10-CM

## 2020-02-28 DIAGNOSIS — Z950 Presence of cardiac pacemaker: Secondary | ICD-10-CM

## 2020-02-28 DIAGNOSIS — I480 Paroxysmal atrial fibrillation: Secondary | ICD-10-CM

## 2020-02-28 DIAGNOSIS — Z7189 Other specified counseling: Secondary | ICD-10-CM | POA: Diagnosis not present

## 2020-02-28 LAB — POCT INR: INR: 3.2 — AB (ref 2.0–3.0)

## 2020-02-28 NOTE — Patient Instructions (Signed)
Description   Hold today and then start taking 1.5 tablets everyday. Recheck INR in 3 weeks. Call # [phone] if placed on any new medications.

## 2020-02-28 NOTE — Patient Instructions (Signed)
Medication Instructions:  Your physician recommends that you continue on your current medications as directed. Please refer to the Current Medication list given to you today.  *If you need a refill on your cardiac medications before your next appointment, please call your pharmacy*   Lab Work: None If you have labs (blood work) drawn today and your tests are completely normal, you will receive your results only by: . MyChart Message (if you have MyChart) OR . A paper copy in the mail If you have any lab test that is abnormal or we need to change your treatment, we will call you to review the results.   Testing/Procedures: None   Follow-Up: At [facility], you and your health needs are our priority.  As part of our continuing mission to provide you with exceptional heart care, we have created designated Provider Care Teams.  These Care Teams include your primary Cardiologist (physician) and Advanced Practice Providers (APPs -  Physician Assistants and Nurse Practitioners) who all work together to provide you with the care you need, when you need it.  We recommend signing up for the patient portal called "MyChart".  Sign up information is provided on this After Visit Summary.  MyChart is used to connect with patients for Virtual Visits (Telemedicine).  Patients are able to view lab/test results, encounter notes, upcoming appointments, etc.  Non-urgent messages can be sent to your provider as well.   To learn more about what you can do with MyChart, go to [email or web address].    Your next appointment:   12 month(s)  The format for your next appointment:   In Person  Provider:   You may see Henry W Smith III, MD or one of the following Advanced Practice Providers on your designated Care Team:    Lori Gerhardt, NP  Laura Ingold, NP  Jill McDaniel, NP    Other Instructions   

## 2020-03-14 ENCOUNTER — Other Ambulatory Visit: Payer: Self-pay | Admitting: *Deleted

## 2020-03-14 MED ORDER — PREGABALIN 25 MG PO CAPS: ORAL_CAPSULE | ORAL | 1 refills | Status: DC

## 2020-03-20 ENCOUNTER — Other Ambulatory Visit: Payer: Self-pay

## 2020-03-20 ENCOUNTER — Ambulatory Visit (INDEPENDENT_AMBULATORY_CARE_PROVIDER_SITE_OTHER): Payer: Medicare Other | Admitting: *Deleted

## 2020-03-20 DIAGNOSIS — I5030 Unspecified diastolic (congestive) heart failure: Secondary | ICD-10-CM | POA: Diagnosis not present

## 2020-03-20 DIAGNOSIS — Z5181 Encounter for therapeutic drug level monitoring: Secondary | ICD-10-CM | POA: Diagnosis not present

## 2020-03-20 DIAGNOSIS — N183 Chronic kidney disease, stage 3 unspecified: Secondary | ICD-10-CM | POA: Diagnosis not present

## 2020-03-20 DIAGNOSIS — E538 Deficiency of other specified B group vitamins: Secondary | ICD-10-CM | POA: Diagnosis not present

## 2020-03-20 DIAGNOSIS — I480 Paroxysmal atrial fibrillation: Secondary | ICD-10-CM | POA: Diagnosis not present

## 2020-03-20 DIAGNOSIS — F322 Major depressive disorder, single episode, severe without psychotic features: Secondary | ICD-10-CM | POA: Diagnosis not present

## 2020-03-20 DIAGNOSIS — I10 Essential (primary) hypertension: Secondary | ICD-10-CM | POA: Diagnosis not present

## 2020-03-20 DIAGNOSIS — N400 Enlarged prostate without lower urinary tract symptoms: Secondary | ICD-10-CM | POA: Diagnosis not present

## 2020-03-20 DIAGNOSIS — I4891 Unspecified atrial fibrillation: Secondary | ICD-10-CM | POA: Diagnosis not present

## 2020-03-20 DIAGNOSIS — E782 Mixed hyperlipidemia: Secondary | ICD-10-CM | POA: Diagnosis not present

## 2020-03-20 DIAGNOSIS — I5032 Chronic diastolic (congestive) heart failure: Secondary | ICD-10-CM | POA: Diagnosis not present

## 2020-03-20 DIAGNOSIS — D6869 Other thrombophilia: Secondary | ICD-10-CM | POA: Diagnosis not present

## 2020-03-20 DIAGNOSIS — F329 Major depressive disorder, single episode, unspecified: Secondary | ICD-10-CM | POA: Diagnosis not present

## 2020-03-20 DIAGNOSIS — G459 Transient cerebral ischemic attack, unspecified: Secondary | ICD-10-CM | POA: Diagnosis not present

## 2020-03-20 DIAGNOSIS — E039 Hypothyroidism, unspecified: Secondary | ICD-10-CM | POA: Diagnosis not present

## 2020-03-20 LAB — POCT INR: INR: 5 — AB (ref 2.0–3.0)

## 2020-03-20 NOTE — Patient Instructions (Signed)
Description   Hold today and tomorrow  then start taking 1.5 tablets everyday except for 1 tablet on Mondays and Fridays. Recheck INR in 2 weeks. Call # [phone] if placed on any new medications.

## 2020-03-29 DIAGNOSIS — I5032 Chronic diastolic (congestive) heart failure: Secondary | ICD-10-CM | POA: Diagnosis not present

## 2020-04-03 ENCOUNTER — Other Ambulatory Visit: Payer: Self-pay

## 2020-04-03 ENCOUNTER — Ambulatory Visit (INDEPENDENT_AMBULATORY_CARE_PROVIDER_SITE_OTHER): Payer: Medicare Other | Admitting: *Deleted

## 2020-04-03 DIAGNOSIS — Z5181 Encounter for therapeutic drug level monitoring: Secondary | ICD-10-CM

## 2020-04-03 DIAGNOSIS — I480 Paroxysmal atrial fibrillation: Secondary | ICD-10-CM

## 2020-04-03 LAB — POCT INR: INR: 2.5 (ref 2.0–3.0)

## 2020-04-03 NOTE — Patient Instructions (Signed)
Description   Continue taking 1.5 tablets everyday except for 1 tablet on Mondays and Fridays. Recheck INR in 3 weeks. Call # [phone] if placed on any new medications.

## 2020-04-05 ENCOUNTER — Other Ambulatory Visit: Payer: Self-pay

## 2020-04-05 ENCOUNTER — Encounter: Payer: Self-pay | Admitting: Podiatry

## 2020-04-05 ENCOUNTER — Ambulatory Visit (INDEPENDENT_AMBULATORY_CARE_PROVIDER_SITE_OTHER): Payer: Medicare Other | Admitting: Podiatry

## 2020-04-05 VITALS — Temp 97.5°F

## 2020-04-05 DIAGNOSIS — B351 Tinea unguium: Secondary | ICD-10-CM

## 2020-04-05 DIAGNOSIS — E1142 Type 2 diabetes mellitus with diabetic polyneuropathy: Secondary | ICD-10-CM

## 2020-04-05 DIAGNOSIS — L608 Other nail disorders: Secondary | ICD-10-CM | POA: Diagnosis not present

## 2020-04-05 DIAGNOSIS — M79676 Pain in unspecified toe(s): Secondary | ICD-10-CM

## 2020-04-05 DIAGNOSIS — D689 Coagulation defect, unspecified: Secondary | ICD-10-CM | POA: Diagnosis not present

## 2020-04-05 NOTE — Progress Notes (Signed)
This patient returns to my office for at risk foot care.  This patient requires this care by a professional since this patient will be at risk due to having peripheral neuropathy, CKD, Coagulation defect and thrombocytopenia.  This patient is unable to cut nails himself since the patient cannot reach his nails.These nails are painful walking and wearing shoes.  This patient presents for at risk foot care today.  He presents to the office with male caregiver.  He is taking coumadin.  General Appearance  Alert, conversant and in no acute stress.  Vascular  Dorsalis pedis and posterior tibial  pulses are not  palpable secondary to severe swelling. bilaterally.  Capillary return is within normal limits  bilaterally. Temperature is within normal limits  bilaterally.  Neurologic  Senn-Weinstein monofilament wire test within normal limits  bilaterally. Muscle power within normal limits bilaterally.  Nails Thick disfigured discolored nails with subungual debris  from hallux to fifth toes bilaterally. Pincer nails hallux  B/L. No evidence of bacterial infection or drainage bilaterally.  Orthopedic  No limitations of motion  feet .  No crepitus or effusions noted.  No bony pathology or digital deformities noted.  Skin  normotropic skin with no porokeratosis noted bilaterally.  No signs of infections or ulcers noted.     Onychomycosis  Pain in right toes  Pain in left toes  Consent was obtained for treatment procedures.   Mechanical debridement of nails 1-5  bilaterally performed with a nail nipper.  Filed with dremel without incident.    Return office visit  10 weeks                   Told patient to return for periodic foot care and evaluation due to potential at risk complications.   Nicoya Friel DPM  

## 2020-04-07 ENCOUNTER — Ambulatory Visit (INDEPENDENT_AMBULATORY_CARE_PROVIDER_SITE_OTHER): Payer: Medicare Other | Admitting: *Deleted

## 2020-04-07 DIAGNOSIS — I495 Sick sinus syndrome: Secondary | ICD-10-CM | POA: Diagnosis not present

## 2020-04-07 LAB — CUP PACEART REMOTE DEVICE CHECK
Battery Remaining Longevity: 41 mo
Battery Voltage: 2.94 V
Brady Statistic AP VP Percent: 99.98 %
Brady Statistic AP VS Percent: 0 %
Brady Statistic AS VP Percent: 0 %
Brady Statistic AS VS Percent: 0.02 %
Brady Statistic RA Percent Paced: 99.99 %
Brady Statistic RV Percent Paced: 99.98 %
Date Time Interrogation Session: 20210408221814
Implantable Lead Implant Date: 20191206
Implantable Lead Implant Date: 20191206
Implantable Lead Location: 753860
Implantable Lead Location: 753860
Implantable Lead Model: 3830
Implantable Lead Model: 5076
Implantable Pulse Generator Implant Date: 20191206
Lead Channel Impedance Value: 285 Ohm
Lead Channel Impedance Value: 323 Ohm
Lead Channel Impedance Value: 399 Ohm
Lead Channel Impedance Value: 418 Ohm
Lead Channel Sensing Intrinsic Amplitude: 2 mV
Lead Channel Sensing Intrinsic Amplitude: 4 mV
Lead Channel Sensing Intrinsic Amplitude: 4 mV
Lead Channel Setting Pacing Amplitude: 3.5 V
Lead Channel Setting Pacing Amplitude: 3.5 V
Lead Channel Setting Pacing Pulse Width: 0.4 ms
Lead Channel Setting Sensing Sensitivity: 1.2 mV

## 2020-04-07 NOTE — Progress Notes (Signed)
PPM Remote  

## 2020-04-21 ENCOUNTER — Other Ambulatory Visit: Payer: Self-pay | Admitting: *Deleted

## 2020-04-21 MED ORDER — WARFARIN SODIUM 5 MG PO TABS: ORAL_TABLET | ORAL | 0 refills | Status: DC

## 2020-04-24 ENCOUNTER — Other Ambulatory Visit: Payer: Self-pay

## 2020-04-24 ENCOUNTER — Ambulatory Visit (INDEPENDENT_AMBULATORY_CARE_PROVIDER_SITE_OTHER): Payer: Medicare Other | Admitting: Pharmacist

## 2020-04-24 DIAGNOSIS — I480 Paroxysmal atrial fibrillation: Secondary | ICD-10-CM | POA: Diagnosis not present

## 2020-04-24 LAB — POCT INR: INR: 3.6 — AB (ref 2.0–3.0)

## 2020-04-24 NOTE — Patient Instructions (Signed)
Description   Hold your warfarin today, then start taking 1.5 tablets everyday except for 1 tablet on Mondays AND Fridays. Recheck INR in 2 weeks. Call # [phone] if placed on any new medications.

## 2020-04-27 DIAGNOSIS — E782 Mixed hyperlipidemia: Secondary | ICD-10-CM | POA: Diagnosis not present

## 2020-04-27 DIAGNOSIS — I2510 Atherosclerotic heart disease of native coronary artery without angina pectoris: Secondary | ICD-10-CM | POA: Diagnosis not present

## 2020-04-27 DIAGNOSIS — F322 Major depressive disorder, single episode, severe without psychotic features: Secondary | ICD-10-CM | POA: Diagnosis not present

## 2020-04-27 DIAGNOSIS — I10 Essential (primary) hypertension: Secondary | ICD-10-CM | POA: Diagnosis not present

## 2020-04-27 DIAGNOSIS — I5030 Unspecified diastolic (congestive) heart failure: Secondary | ICD-10-CM | POA: Diagnosis not present

## 2020-04-27 DIAGNOSIS — G459 Transient cerebral ischemic attack, unspecified: Secondary | ICD-10-CM | POA: Diagnosis not present

## 2020-04-27 DIAGNOSIS — I5032 Chronic diastolic (congestive) heart failure: Secondary | ICD-10-CM | POA: Diagnosis not present

## 2020-04-27 DIAGNOSIS — N400 Enlarged prostate without lower urinary tract symptoms: Secondary | ICD-10-CM | POA: Diagnosis not present

## 2020-04-27 DIAGNOSIS — I480 Paroxysmal atrial fibrillation: Secondary | ICD-10-CM | POA: Diagnosis not present

## 2020-04-27 DIAGNOSIS — E039 Hypothyroidism, unspecified: Secondary | ICD-10-CM | POA: Diagnosis not present

## 2020-04-27 DIAGNOSIS — F329 Major depressive disorder, single episode, unspecified: Secondary | ICD-10-CM | POA: Diagnosis not present

## 2020-04-28 DIAGNOSIS — I5032 Chronic diastolic (congestive) heart failure: Secondary | ICD-10-CM | POA: Diagnosis not present

## 2020-05-03 ENCOUNTER — Ambulatory Visit
Admission: RE | Admit: 2020-05-03 | Discharge: 2020-05-03 | Disposition: A | Discharge status: Home / Self Care | Payer: Medicare Other | Source: Ambulatory Visit | Attending: Internal Medicine | Admitting: Internal Medicine

## 2020-05-03 ENCOUNTER — Other Ambulatory Visit: Payer: Self-pay | Admitting: Internal Medicine

## 2020-05-03 DIAGNOSIS — K5900 Constipation, unspecified: Secondary | ICD-10-CM

## 2020-05-03 DIAGNOSIS — J9811 Atelectasis: Secondary | ICD-10-CM | POA: Diagnosis not present

## 2020-05-03 DIAGNOSIS — R0781 Pleurodynia: Secondary | ICD-10-CM | POA: Diagnosis not present

## 2020-05-05 ENCOUNTER — Other Ambulatory Visit: Payer: Self-pay | Admitting: Internal Medicine

## 2020-05-05 DIAGNOSIS — R222 Localized swelling, mass and lump, trunk: Secondary | ICD-10-CM

## 2020-05-08 ENCOUNTER — Ambulatory Visit (INDEPENDENT_AMBULATORY_CARE_PROVIDER_SITE_OTHER): Payer: Medicare Other | Admitting: *Deleted

## 2020-05-08 ENCOUNTER — Other Ambulatory Visit: Payer: Self-pay

## 2020-05-08 DIAGNOSIS — I480 Paroxysmal atrial fibrillation: Secondary | ICD-10-CM

## 2020-05-08 DIAGNOSIS — Z5181 Encounter for therapeutic drug level monitoring: Secondary | ICD-10-CM | POA: Diagnosis not present

## 2020-05-08 LAB — POCT INR: INR: 2.3 (ref 2.0–3.0)

## 2020-05-08 NOTE — Patient Instructions (Addendum)
Description   Continue taking 1.5 tablets everyday except for 1 tablet on Mondays and Fridays. Recheck INR in 3 weeks. Call # [phone] if placed on any new medications.

## 2020-05-10 ENCOUNTER — Encounter (INDEPENDENT_AMBULATORY_CARE_PROVIDER_SITE_OTHER): Payer: Self-pay | Admitting: Ophthalmology

## 2020-05-10 ENCOUNTER — Ambulatory Visit (INDEPENDENT_AMBULATORY_CARE_PROVIDER_SITE_OTHER): Payer: Medicare Other | Admitting: Ophthalmology

## 2020-05-10 ENCOUNTER — Other Ambulatory Visit: Payer: Self-pay

## 2020-05-10 DIAGNOSIS — H16213 Exposure keratoconjunctivitis, bilateral: Secondary | ICD-10-CM | POA: Diagnosis not present

## 2020-05-10 DIAGNOSIS — H353222 Exudative age-related macular degeneration, left eye, with inactive choroidal neovascularization: Secondary | ICD-10-CM

## 2020-05-10 DIAGNOSIS — H04123 Dry eye syndrome of bilateral lacrimal glands: Secondary | ICD-10-CM | POA: Diagnosis not present

## 2020-05-10 DIAGNOSIS — H353134 Nonexudative age-related macular degeneration, bilateral, advanced atrophic with subfoveal involvement: Secondary | ICD-10-CM | POA: Diagnosis not present

## 2020-05-10 DIAGNOSIS — H353212 Exudative age-related macular degeneration, right eye, with inactive choroidal neovascularization: Secondary | ICD-10-CM

## 2020-05-10 DIAGNOSIS — H3561 Retinal hemorrhage, right eye: Secondary | ICD-10-CM

## 2020-05-10 NOTE — Assessment & Plan Note (Signed)
I have informed Douglas Walker and his family that incomplete lid closure will lead to ocular surface drying and secondary reflex tearing.  I have encouraged [name] to either use artificial tears or even better to remember consciously to blink completely every 1 to 2 minutes

## 2020-05-10 NOTE — Progress Notes (Signed)
05/10/2020     CHIEF COMPLAINT Patient presents for Retina Follow Up   HISTORY OF PRESENT ILLNESS: Douglas Walker is a 86 y.o. male who presents to the clinic today for:   HPI    Retina Follow Up    Patient presents with  Wet AMD.  In both eyes.  This started 1 year ago.  Severity is mild.  Duration of 1 year.  Since onset it is gradually improving.          Comments    1 Year AMD F/U OU  Pt reports black spots in [place] OU are improving. Pt denies blur OU. Pt denies ocular pain or distortion OU.       Last edited by Rockie Neighbours, [place] on 05/10/2020  9:16 AM. (History)      Referring physician: Josetta Huddle, MD [address],  [place] [address]  HISTORICAL INFORMATION:   Selected notes from the [facility]: No current outpatient medications on file. (Ophthalmic Drugs)   No current facility-administered medications for this visit. (Ophthalmic Drugs)   Current Outpatient Medications (Other)  Medication Sig  . acetaminophen (TYLENOL) 500 MG tablet Take 1,000 mg by mouth every 6 (six) hours as needed for mild pain.  . beta carotene w/minerals (OCUVITE) tablet Take 1 tablet by mouth daily.   . DULoxetine (CYMBALTA) 20 MG capsule Take 20 mg by mouth 2 (two) times daily.  . furosemide (LASIX) 20 MG tablet Take 60 mg by mouth daily.  Marland Kitchen levothyroxine (SYNTHROID) 25 MCG tablet TAKE 1 TABLET (25 MCG TOTAL) BY MOUTH DAILY BEFORE BREAKFAST.  Marland Kitchen losartan (COZAAR) 50 MG tablet Take 50 mg by mouth daily.  . pregabalin (LYRICA) 25 MG capsule TAKE 1 CAPSULE(25 MG) BY MOUTH AT BEDTIME  . primidone (MYSOLINE) 250 MG tablet Take 250 mg by mouth 2 (two) times daily.   . Tamsulosin HCl (FLOMAX) 0.4 MG CAPS Take 0.4 mg by mouth every evening.   . topiramate (TOPAMAX) 50 MG tablet Take 1 tablet (50 mg total) by mouth 2 (two) times daily.  . traMADol (ULTRAM) 50 MG tablet TK 1 T PO Q 6 HOURS PRN FOR 30 DAYS  . warfarin (COUMADIN) 5  MG tablet Take as directed by the coumadin clinic   No current facility-administered medications for this visit. (Other)      REVIEW OF SYSTEMS:    ALLERGIES Allergies  Allergen Reactions  . Sulfa Antibiotics Other (See Comments)    Weakness, dizziness    PAST MEDICAL HISTORY Past Medical History:  Diagnosis Date  . Chronic low back pain 12/15/2017  . CKD (chronic kidney disease) stage 3, GFR 30-59 ml/min   . Coronary artery disease   . Essential tremor 02/28/2016  . GERD (gastroesophageal reflux disease)   . Hernia   . Hyperlipemia   . Hypertension   . Macular degeneration   . Paroxysmal atrial fibrillation ([place]) 10/28/2013  . Presence of permanent cardiac pacemaker 12/04/2018  . Tachycardia-bradycardia syndrome ([place]) 09/13/2014  . TIA (transient ischemic attack)    Past Surgical History:  Procedure Laterality Date  . APPENDECTOMY    . EYE SURGERY    . HERNIA REPAIR    . HERNIA REPAIR    . INSERT / REPLACE / REMOVE PACEMAKER  12/04/2018  . PACEMAKER IMPLANT N/A 12/04/2018   Procedure: PACEMAKER IMPLANT;  Surgeon: Evans Lance, MD;  Location: [facility];  Service: Cardiovascular;  Laterality: N/A;    FAMILY HISTORY Family History  Problem Relation Age of Onset  . GI problems Mother   . Other Sister        PAIN ISSUES  . Hearing loss Sister   . Blindness Sister   . Heart attack Neg Hx   . Stroke Neg Hx     SOCIAL HISTORY Social History   Tobacco Use  . Smoking status: Former Smoker    Quit date: 12/30/1962    Years since quitting: 57.4  . Smokeless tobacco: Never Used  Substance Use Topics  . Alcohol use: No  . Drug use: No         OPHTHALMIC EXAM:  Base Eye Exam    Visual Acuity (ETDRS)      Right Left   Dist cc 20/400 20/400   Dist ph cc NI NI   Correction: Glasses       Tonometry (Tonopen, 9:21 AM)      Right Left   Pressure 11 11       Pupils      Pupils Dark Light Shape React APD   Right PERRL 2 1 Round Minimal None     Left PERRL 2 1 Round Minimal None       Visual Fields (Counting fingers)      Left Right    Full    Restrictions  Total inferior temporal deficiency       Extraocular Movement      Right Left    Full Full       Neuro/Psych    Oriented x3: Yes   Mood/Affect: Normal       Dilation    Both eyes: 1.0% Mydriacyl, 2.5% Phenylephrine @ 9:21 AM        Slit Lamp and Fundus Exam    External Exam      Right Left   External Normal Normal       Slit Lamp Exam      Right Left   Lids/Lashes Normal Normal   Conjunctiva/Sclera White and quiet White and quiet   Cornea Clear,, old PKP graft.   Clear   Anterior Chamber Deep and quiet Deep and quiet   Iris Round and reactive Round and reactive   Lens Posterior chamber intraocular lens, Centered posterior chamber intraocular lens Posterior chamber intraocular lens, Centered posterior chamber intraocular lens   Anterior Vitreous Normal Normal       Fundus Exam      Right Left   Posterior Vitreous Posterior vitreous detachment Posterior vitreous detachment   Disc Normal Normal   C/D Ratio 0.45 0.45   Macula Geographic atrophy, Macular atrophy, Hard drusen, Disciform scar Geographic atrophy, Macular atrophy, Hard drusen, Disciform scar   Vessels Normal Normal   Periphery Normal Normal          IMAGING AND PROCEDURES  Imaging and Procedures for 05/10/20  Color Fundus Photography Optos - OU - Both Eyes       Right Eye Progression has been stable. Disc findings include normal observations. Macula : retinal pigment epithelium abnormalities, geographic atrophy, drusen. Vessels : normal observations. Periphery : normal observations.   Left Eye Progression has been stable. Disc findings include normal observations. Macula : drusen, geographic atrophy, retinal pigment epithelium abnormalities.   Notes Macular disciform scars, geographic retinal pigment epithelial atrophy in each eye centrally no change in stable                 ASSESSMENT/PLAN:  Exposure  keratopathy, bilateral I have informed Mr. Higinbotham and his family that incomplete lid closure will lead to ocular surface drying and secondary reflex tearing.  I have encouraged Mr. Vardeman to either use artificial tears or even better to remember consciously to blink completely every 1 to 2 minutes       ICD-10-CM   1. Exudative age-related macular degeneration of right eye with inactive choroidal neovascularization (HCC)  H35.3212 Color Fundus Photography Optos - OU - Both Eyes  2. Exudative age-related macular degeneration of left eye with inactive choroidal neovascularization ([place])  H35.3222   3. Retinal hemorrhage of right eye  H35.61   4. Bilateral dry eyes  H04.123   5. Exposure keratopathy, bilateral  H16.213   6. Advanced nonexudative age-related macular degeneration of both eyes with subfoveal involvement  H35.3134     1.  No interval changes in #1 or 2  2.  Dry age-related macular degeneration accounts for vision in addition to ocular surface drying  3. 4.  I have informed Mr. Stickels and his family that incomplete lid closure will lead to ocular surface drying and secondary reflex tearing.  I have encouraged Mr. Weitzner to either use artificial tears or even better to remember consciously to blink completely every 1 to 2 minutes   Ophthalmic Meds Ordered this visit:  No orders of the defined types were placed in this encounter.      No follow-ups on file.  There are no Patient Instructions on file for this visit.   Explained the diagnoses, plan, and follow up with the patient and they expressed understanding.  Patient expressed understanding of the importance of proper follow up care.   Clent Demark Kamali Sakata M.D. Diseases & Surgery of the Retina and Vitreous [facility] [place] 05/10/20     Abbreviations: M myopia (nearsighted); A astigmatism; H hyperopia (farsighted); P presbyopia; Mrx spectacle prescription;  CTL contact  lenses; OD right eye; OS left eye; OU both eyes  XT exotropia; ET esotropia; PEK punctate epithelial keratitis; PEE punctate epithelial erosions; DES dry eye syndrome; MGD meibomian gland dysfunction; ATs artificial tears; PFAT's preservative free artificial tears; [place] nuclear sclerotic cataract; PSC posterior subcapsular cataract; ERM epi-retinal membrane; PVD posterior vitreous detachment; RD retinal detachment; DM diabetes mellitus; DR diabetic retinopathy; NPDR non-proliferative diabetic retinopathy; PDR proliferative diabetic retinopathy; CSME clinically significant macular edema; DME diabetic macular edema; dbh dot blot hemorrhages; CWS cotton wool spot; POAG primary open angle glaucoma; C/D cup-to-disc ratio; HVF humphrey visual field; GVF goldmann visual field; OCT optical coherence tomography; IOP intraocular pressure; BRVO Branch retinal vein occlusion; CRVO central retinal vein occlusion; CRAO central retinal artery occlusion; BRAO branch retinal artery occlusion; RT retinal tear; SB scleral buckle; PPV pars plana vitrectomy; VH Vitreous hemorrhage; PRP panretinal laser photocoagulation; IVK intravitreal kenalog; VMT vitreomacular traction; MH Macular hole;  NVD neovascularization of the disc; NVE neovascularization elsewhere; AREDS age related eye disease study; ARMD age related macular degeneration; POAG primary open angle glaucoma; EBMD epithelial/anterior basement membrane dystrophy; ACIOL anterior chamber intraocular lens; IOL intraocular lens; PCIOL posterior chamber intraocular lens; Phaco/IOL phacoemulsification with intraocular lens placement; [place] photorefractive keratectomy; LASIK laser assisted in situ keratomileusis; HTN hypertension; DM diabetes mellitus; COPD chronic obstructive pulmonary disease

## 2020-05-17 ENCOUNTER — Other Ambulatory Visit: Payer: Self-pay

## 2020-05-17 ENCOUNTER — Ambulatory Visit
Admission: RE | Admit: 2020-05-17 | Discharge: 2020-05-17 | Disposition: A | Discharge status: Home / Self Care | Payer: Medicare Other | Source: Ambulatory Visit | Attending: Internal Medicine | Admitting: Internal Medicine

## 2020-05-17 DIAGNOSIS — R222 Localized swelling, mass and lump, trunk: Secondary | ICD-10-CM

## 2020-05-17 DIAGNOSIS — R918 Other nonspecific abnormal finding of lung field: Secondary | ICD-10-CM | POA: Diagnosis not present

## 2020-05-17 DIAGNOSIS — J8410 Pulmonary fibrosis, unspecified: Secondary | ICD-10-CM | POA: Diagnosis not present

## 2020-05-19 DIAGNOSIS — E039 Hypothyroidism, unspecified: Secondary | ICD-10-CM | POA: Diagnosis not present

## 2020-05-19 DIAGNOSIS — I2510 Atherosclerotic heart disease of native coronary artery without angina pectoris: Secondary | ICD-10-CM | POA: Diagnosis not present

## 2020-05-19 DIAGNOSIS — N183 Chronic kidney disease, stage 3 unspecified: Secondary | ICD-10-CM | POA: Diagnosis not present

## 2020-05-19 DIAGNOSIS — I480 Paroxysmal atrial fibrillation: Secondary | ICD-10-CM | POA: Diagnosis not present

## 2020-05-19 DIAGNOSIS — I5030 Unspecified diastolic (congestive) heart failure: Secondary | ICD-10-CM | POA: Diagnosis not present

## 2020-05-19 DIAGNOSIS — E782 Mixed hyperlipidemia: Secondary | ICD-10-CM | POA: Diagnosis not present

## 2020-05-19 DIAGNOSIS — F329 Major depressive disorder, single episode, unspecified: Secondary | ICD-10-CM | POA: Diagnosis not present

## 2020-05-19 DIAGNOSIS — N400 Enlarged prostate without lower urinary tract symptoms: Secondary | ICD-10-CM | POA: Diagnosis not present

## 2020-05-19 DIAGNOSIS — G459 Transient cerebral ischemic attack, unspecified: Secondary | ICD-10-CM | POA: Diagnosis not present

## 2020-05-19 DIAGNOSIS — I4891 Unspecified atrial fibrillation: Secondary | ICD-10-CM | POA: Diagnosis not present

## 2020-05-19 DIAGNOSIS — I10 Essential (primary) hypertension: Secondary | ICD-10-CM | POA: Diagnosis not present

## 2020-05-19 DIAGNOSIS — F322 Major depressive disorder, single episode, severe without psychotic features: Secondary | ICD-10-CM | POA: Diagnosis not present

## 2020-05-26 DIAGNOSIS — I5032 Chronic diastolic (congestive) heart failure: Secondary | ICD-10-CM | POA: Diagnosis not present

## 2020-05-30 ENCOUNTER — Ambulatory Visit (INDEPENDENT_AMBULATORY_CARE_PROVIDER_SITE_OTHER): Payer: Medicare Other | Admitting: Pharmacist

## 2020-05-30 ENCOUNTER — Other Ambulatory Visit: Payer: Self-pay

## 2020-05-30 DIAGNOSIS — I480 Paroxysmal atrial fibrillation: Secondary | ICD-10-CM | POA: Diagnosis not present

## 2020-05-30 DIAGNOSIS — Z5181 Encounter for therapeutic drug level monitoring: Secondary | ICD-10-CM | POA: Diagnosis not present

## 2020-05-30 LAB — POCT INR: INR: 2.2 (ref 2.0–3.0)

## 2020-05-30 NOTE — Patient Instructions (Signed)
Description   Continue taking 1.5 tablets everyday except for 1 tablet on Mondays and Fridays. Recheck INR in 6 weeks. Call # [phone] if placed on any new medications.

## 2020-06-14 ENCOUNTER — Ambulatory Visit: Payer: Medicare Other | Admitting: Podiatry

## 2020-06-16 ENCOUNTER — Other Ambulatory Visit: Payer: Self-pay

## 2020-06-16 ENCOUNTER — Encounter: Payer: Self-pay | Admitting: Podiatry

## 2020-06-16 ENCOUNTER — Ambulatory Visit (INDEPENDENT_AMBULATORY_CARE_PROVIDER_SITE_OTHER): Payer: Medicare Other | Admitting: Podiatry

## 2020-06-16 DIAGNOSIS — D689 Coagulation defect, unspecified: Secondary | ICD-10-CM

## 2020-06-16 DIAGNOSIS — E1142 Type 2 diabetes mellitus with diabetic polyneuropathy: Secondary | ICD-10-CM

## 2020-06-16 DIAGNOSIS — M79676 Pain in unspecified toe(s): Secondary | ICD-10-CM

## 2020-06-16 DIAGNOSIS — L608 Other nail disorders: Secondary | ICD-10-CM

## 2020-06-16 DIAGNOSIS — B351 Tinea unguium: Secondary | ICD-10-CM

## 2020-06-16 NOTE — Progress Notes (Signed)
This patient returns to my office for at risk foot care.  This patient requires this care by a professional since this patient will be at risk due to having peripheral neuropathy, CKD, Coagulation defect and thrombocytopenia.  This patient is unable to cut nails himself since the patient cannot reach his nails.These nails are painful walking and wearing shoes.  This patient presents for at risk foot care today.  He presents to the office with male caregiver.  He is taking coumadin.  General Appearance  Alert, conversant and in no acute stress.  Vascular  Dorsalis pedis and posterior tibial  pulses are not  palpable secondary to severe swelling. bilaterally.  Capillary return is within normal limits  bilaterally. Temperature is within normal limits  bilaterally.  Neurologic  Senn-Weinstein monofilament wire test within normal limits  bilaterally. Muscle power within normal limits bilaterally.  Nails Thick disfigured discolored nails with subungual debris  from hallux to fifth toes bilaterally. Pincer nails hallux  B/L. No evidence of bacterial infection or drainage bilaterally.  Orthopedic  No limitations of motion  feet .  No crepitus or effusions noted.  No bony pathology or digital deformities noted.  Skin  normotropic skin with no porokeratosis noted bilaterally.  No signs of infections or ulcers noted.     Onychomycosis  Pain in right toes  Pain in left toes  Consent was obtained for treatment procedures.   Mechanical debridement of nails 1-5  bilaterally performed with a nail nipper.  Filed with dremel without incident. Neosporin/DSD right hallux since nail grew into his skin.   Return office visit  10 weeks                   Told patient to return for periodic foot care and evaluation due to potential at risk complications.   Gardiner Barefoot DPM

## 2020-06-27 ENCOUNTER — Telehealth: Payer: Self-pay | Admitting: *Deleted

## 2020-06-27 ENCOUNTER — Other Ambulatory Visit: Payer: Self-pay | Admitting: General Surgery

## 2020-06-27 NOTE — Telephone Encounter (Signed)
° °  [facility] Pre-operative Risk Assessment    HEARTCARE STAFF: - Please ensure there is not already an duplicate clearance open for this procedure. - Under Visit Info/Reason for Call, type in Other and utilize the format Clearance MM/DD/YY or Clearance TBD. Do not use dashes or single digits. - If request is for dental extraction, please clarify the # of teeth to be extracted.  Request for surgical clearance:  1. What type of surgery is being performed?  EXCISION RIGHT CHEST WALL MASS   2. When is this surgery scheduled?  TBD   3. What type of clearance is required (medical clearance vs. Pharmacy clearance to hold med vs. Both)?  BOTH  4. Are there any medications that need to be held prior to surgery and how long?  COUMADIN  5. Practice name and name of physician performing surgery?  [facility] / DR. BYERLY   6. What is the office phone number?  [phone]   7.   What is the office fax number?  [phone]  8.   Anesthesia type (None, local, MAC, general) ?  GENERAL   Jeanann Lewandowsky 06/27/2020, 4:06 PM  _________________________________________________________________   (provider comments below)

## 2020-06-27 NOTE — Telephone Encounter (Signed)
   Primary Cardiologist: Sinclair Grooms, MD  Chart reviewed as part of pre-operative protocol coverage. H/o tachy-brady syndrome with recurrent syncope, PAF, chronic anticoagulation, TIA, CAD, PPM, CKD, low back pain, essential tremor, macular degeneration. Will route to pharm then patient will need call.  Charlie Pitter, PA-C 06/27/2020, 5:25 PM

## 2020-06-28 NOTE — Telephone Encounter (Signed)
Patient with diagnosis of afib on warfarin for anticoagulation.    Procedure: EXCISION RIGHT CHEST WALL MASS  Date of procedure: TBD  CHADS2-VASc score of  7 (CHF, HTN, AGE, stroke/tia x 2, CAD, AGE)  Patient does meet criteria for Lovenox bridge due to hx of TIA. However, due to age (28) I will ask Dr. Tamala Julian to weigh in.

## 2020-06-29 NOTE — Telephone Encounter (Signed)
   Primary Cardiologist: Sinclair Grooms, MD  Chart reviewed as part of pre-operative protocol coverage. Given past medical history and time since last visit, based on ACC/AHA guidelines, Douglas Walker would be at acceptable risk for the planned procedure without further cardiovascular testing.   Patient with diagnosis of afib on warfarin for anticoagulation.    Procedure: EXCISION RIGHT CHEST WALL MASS Date of procedure: TBD  CHADS2-VASc score of  7 (CHF, HTN, AGE, stroke/tia x 2, CAD, AGE)  Per Dr. Tamala Julian, patient will need lovenox bridge. Please have patient or pt son tell the coumadin clinic as soon as procedure is scheduled. Will need about a weeks notice to coordinate.  I will route this recommendation to the requesting party via Epic fax function and remove from pre-op pool.  Please call with questions.  Jossie Ng. Faith Patricelli NP-C    06/29/2020, 12:58 PM [place] [address] [address] Office [phone] Fax [phone]

## 2020-06-29 NOTE — Telephone Encounter (Signed)
Per Dr. Tamala Julian, patient will need lovenox bridge. Please have patient or pt son tell the coumadin as soon as procedure is scheduled. Will need about a weeks notice to coordinate.

## 2020-06-29 NOTE — Telephone Encounter (Signed)
If we can arrange Lovenox, let us do that.

## 2020-06-29 NOTE — Telephone Encounter (Signed)
Called requesting providers office and s/w

## 2020-06-30 NOTE — Telephone Encounter (Signed)
Patient has appointment with coumadin clinic on 7/13. We will coordinate bridge at that appointment.

## 2020-06-30 NOTE — Telephone Encounter (Signed)
Caryl Pina from [facility] calling stating the date of surgery is 07/20/2020.

## 2020-06-30 NOTE — Telephone Encounter (Signed)
   Primary Cardiologist: Sinclair Grooms, MD  Chart reviewed as part of pre-operative protocol coverage. Given past medical history and time since last visit, based on ACC/AHA guidelines, Douglas Walker would be at acceptable risk for the planned procedure without further cardiovascular testing.   Patient with diagnosis ofafibon warfarinfor anticoagulation.   Procedure:EXCISION RIGHT CHEST WALL MASS Date of procedure:TBD  CHADS2-VASc score of7(CHF, HTN, AGE, stroke/tia x 2, CAD, AGE)  Per Dr. Tamala Julian, patient will need lovenox bridge. Please have patient or pt son tell the coumadin clinic as soon as procedure is scheduled. Will need about a weeks notice to coordinate.  I will route this recommendation to the requesting party via Epic fax function and remove from pre-op pool.  Please call with questions.  Jossie Ng. Toma Arts NP-C    06/30/2020, 10:41 AM [place] [address] Office [phone] Fax [phone]

## 2020-06-30 NOTE — Telephone Encounter (Signed)
Douglas Walker from [facility] is calling to get clarification on lovenox bridge.

## 2020-06-30 NOTE — Telephone Encounter (Signed)
Patient's son returned call. Michela Pitcher you can reach him there.

## 2020-06-30 NOTE — Telephone Encounter (Signed)
Patient's son returning call. 

## 2020-07-05 NOTE — Telephone Encounter (Signed)
Patient's son called, had questions regarding bridging.  Explained that at his appointment on the 13th we will go over bridging instructions with him and what to do on each date..  Patient voiced understanding.

## 2020-07-07 ENCOUNTER — Ambulatory Visit (INDEPENDENT_AMBULATORY_CARE_PROVIDER_SITE_OTHER): Payer: Medicare Other | Admitting: *Deleted

## 2020-07-07 DIAGNOSIS — I442 Atrioventricular block, complete: Secondary | ICD-10-CM

## 2020-07-07 DIAGNOSIS — I495 Sick sinus syndrome: Secondary | ICD-10-CM | POA: Diagnosis not present

## 2020-07-08 LAB — CUP PACEART REMOTE DEVICE CHECK
Battery Remaining Longevity: 35 mo
Battery Voltage: 2.93 V
Brady Statistic AP VP Percent: 99.99 %
Brady Statistic AP VS Percent: 0 %
Brady Statistic AS VP Percent: 0 %
Brady Statistic AS VS Percent: 0.01 %
Brady Statistic RA Percent Paced: 99.99 %
Brady Statistic RV Percent Paced: 99.99 %
Date Time Interrogation Session: 20210709093336
Implantable Lead Implant Date: 20191206
Implantable Lead Implant Date: 20191206
Implantable Lead Location: 753860
Implantable Lead Location: 753860
Implantable Lead Model: 3830
Implantable Lead Model: 5076
Implantable Pulse Generator Implant Date: 20191206
Lead Channel Impedance Value: 285 Ohm
Lead Channel Impedance Value: 323 Ohm
Lead Channel Impedance Value: 399 Ohm
Lead Channel Impedance Value: 418 Ohm
Lead Channel Sensing Intrinsic Amplitude: 4 mV
Lead Channel Sensing Intrinsic Amplitude: 4 mV
Lead Channel Sensing Intrinsic Amplitude: 5.25 mV
Lead Channel Sensing Intrinsic Amplitude: 5.25 mV
Lead Channel Setting Pacing Amplitude: 3.5 V
Lead Channel Setting Pacing Amplitude: 3.5 V
Lead Channel Setting Pacing Pulse Width: 0.4 ms
Lead Channel Setting Sensing Sensitivity: 1.2 mV

## 2020-07-10 NOTE — Progress Notes (Signed)
Remote pacemaker transmission.   

## 2020-07-11 ENCOUNTER — Ambulatory Visit (INDEPENDENT_AMBULATORY_CARE_PROVIDER_SITE_OTHER): Payer: Medicare Other | Admitting: *Deleted

## 2020-07-11 ENCOUNTER — Other Ambulatory Visit: Payer: Self-pay

## 2020-07-11 DIAGNOSIS — I480 Paroxysmal atrial fibrillation: Secondary | ICD-10-CM

## 2020-07-11 DIAGNOSIS — Z5181 Encounter for therapeutic drug level monitoring: Secondary | ICD-10-CM | POA: Diagnosis not present

## 2020-07-11 LAB — POCT INR: INR: 2 (ref 2.0–3.0)

## 2020-07-11 MED ORDER — ENOXAPARIN SODIUM 120 MG/0.8ML ~~LOC~~ SOLN: 120.0000 mg | SUBCUTANEOUS | 1 refills | Status: DC

## 2020-07-11 NOTE — Patient Instructions (Addendum)
Description   Please follow instructions for upcoming Lovenox bridge. Continue taking 1.5 tablets everyday except for 1 tablet on Mondays and Fridays. Recheck INR in 1 week post procedure (normally 6 weeks). Call # [phone] if placed on any new medications.     7/16: Last dose of Coumadin.  7/17: No Coumadin or Lovenox.  7/18: Inject Lovenox 120mg  in the fatty abdominal tissue at least 2 inches from the belly button every 24 hours starting at 9PM, rotate sites. No Coumadin.  7/19: Inject Lovenox in the fatty tissue daily at 9 PM. No Coumadin.  7/20: Inject Lovenox in the fatty tissuedaily at 9 PM. No Coumadin.  7/21: No Lovenox, No Coumadin.  7/22: Procedure Day - No Lovenox - Resume Coumadin in the evening or as directed by doctor (take an extra half tablet with usual dose for 2 days then resume normal dose).  7/23: Resume Lovenox inject in the fatty tissue daily and take Coumadin.  7/24: Inject Lovenox in the fatty tissue daily and take Coumadin.  7/25: Inject Lovenox in the fatty tissue daily and take Coumadin.  7/26: Inject Lovenox in the fatty tissue daily and take Coumadin.  7/27: Inject Lovenox in the fatty tissue daily and take Coumadin.  7/28:  Inject Lovenox in the fatty tissue daily and take Coumadin.  7/29: Coumadin appt to check INR.

## 2020-07-13 ENCOUNTER — Encounter: Payer: Self-pay | Admitting: Internal Medicine

## 2020-07-13 NOTE — Pre-Procedure Instructions (Signed)
Douglas Walker  07/13/2020    Your procedure is scheduled on Thursday, July 20, 2020 at 10:30 AM.   Report to [facility] Entrance "A" Admitting Office at 8:30 AM.   Call this number if you have problems the morning of surgery: [phone]   Questions prior to day of surgery, please call [phone] between 8 & 4 PM.   Remember:  Do not eat after midnight Wednesday, 07/19/20.  You may drink clear liquids until 7:30 AM.  Clear liquids allowed are: Water, Juice (non-citric and without pulp - diabetics please choose diet or no sugar options), Carbonated beverages - (diabetics please choose diet or no sugar options), Clear Tea, Black Coffee only (no creamer, milk or cream including half and half) and Gatorade (diabetics please choose diet or no sugar options)    Take these medicines the morning of surgery with A SIP OF WATER: Duloxetine (Cymbalta), Levothyroxine (Synthroid), Primidone (Mysoline), Topiramate (Topamax), Acetaminophen (Tylenol) - if needed  Stop Coumadin (Warfarin) as instructed by cardiologist and start Lovenox as instructed by cardiologist. Stop Vitamins 7 days prior to surgery. Do not use Aspirin containing products, NSAIDS (Ibuprofen, Aleve, etc), Fish Oil or Herbal medications prior to surgery.    Do not wear jewelry.  Do not wear lotions, powders, cologne or deodorant.  Men may shave face and neck.  Do not bring valuables to the hospital.  [facility] is not responsible for any belongings or valuables.  Contacts, dentures or bridgework may not be worn into surgery.  Leave your suitcase in the car.  After surgery it may be brought to your room.  For patients admitted to the hospital, discharge time will be determined by your treatment team.  Patients discharged the day of surgery will not be allowed to drive home.   [place] - Preparing for Surgery  Before surgery, you can play an important role.  Because skin is not sterile, your skin needs to be as  free of germs as possible.  You can reduce the number of germs on you skin by washing with CHG (chlorahexidine gluconate) soap before surgery.  CHG is an antiseptic cleaner which kills germs and bonds with the skin to continue killing germs even after washing.  Oral Hygiene is also important in reducing the risk of infection.  Remember to brush your teeth with your regular toothpaste the morning of surgery.  Please DO NOT use if you have an allergy to CHG or antibacterial soaps.  If your skin becomes reddened/irritated stop using the CHG and inform your nurse when you arrive at Short Stay.  Do not shave (including legs and underarms) for at least 48 hours prior to the first CHG shower.  You may shave your face.  Please follow these instructions carefully:   1.  Shower with CHG Soap the night before surgery and the morning of Surgery.  2.  If you choose to wash your hair, wash your hair first as usual with your normal shampoo.  3.  After you shampoo, rinse your hair and body thoroughly to remove the shampoo. 4.  Use CHG as you would any other liquid soap.  You can apply chg directly to the skin and wash gently with a      scrungie or washcloth.           5.  Apply the CHG Soap to your body ONLY FROM THE NECK DOWN.   Do not use on open wounds or open sores. Avoid contact with  your eyes, ears, mouth and genitals (private parts).  Wash genitals (private parts) with your normal soap - do this prior to using CHG soap.  6.  Wash thoroughly, paying special attention to the area where your surgery will be performed.  7.  Thoroughly rinse your body with warm water from the neck down.  8.  DO NOT shower/wash with your normal soap after using and rinsing off the CHG Soap.  9.  Pat yourself dry with a clean towel.            10.  Wear clean pajamas.            11.  Place clean sheets on your bed the night of your first shower and do not sleep with pets.  Day of Surgery  Shower as above. Do not apply any  lotions/deodorants the morning of surgery.   Please wear clean clothes to the hospital. Remember to brush your teeth with toothpaste.  Please read over the fact sheets that you were given.

## 2020-07-13 NOTE — Progress Notes (Addendum)
[facility] DEVICE PROGRAMMING  Patient Information: Name:  NIXXON FARIA  DOB:  15-Mar-1925  MRN:  [id number]   Planned Procedure: Excision of right chest wall (posterior) mass  Surgeon: Dr. Barry Dienes  Date of Procedure: 07/20/20  Cautery will be used.  Position during surgery: prone/side   Please send documentation back to:  [facility] (Fax # [phone])   Device Information:  Clinic EP Physician:  [name], MD  Device Type:  Pacemaker Manufacturer and Phone #:  Medtronic: [phone] Pacemaker Dependent?:  Yes.   Date of Last Device Check:  07/07/2020 (remote transmission). Overdue for in-office follow-up--please contact industry representative. Normal Device Function?:  Yes.    Electrophysiologist's Recommendations:   Have magnet available.  Provide continuous ECG monitoring when magnet is used or reprogramming is to be performed.   Overdue for in-office follow-up--please contact industry representative.  Procedure will likely interfere with device function.  Device should be programmed:  Asynchronous pacing during procedure and returned to normal programming after procedure.  Per Device Clinic [address] [place], [place]  6:22 PM 07/13/2020

## 2020-07-14 ENCOUNTER — Other Ambulatory Visit: Payer: Self-pay

## 2020-07-14 ENCOUNTER — Encounter (HOSPITAL_COMMUNITY)
Admission: RE | Admit: 2020-07-14 | Discharge: 2020-07-14 | Disposition: A | Discharge status: Home / Self Care | Payer: Medicare Other | Source: Ambulatory Visit | Attending: General Surgery | Admitting: General Surgery

## 2020-07-14 ENCOUNTER — Encounter (HOSPITAL_COMMUNITY): Payer: Self-pay

## 2020-07-14 DIAGNOSIS — Z01812 Encounter for preprocedural laboratory examination: Secondary | ICD-10-CM | POA: Insufficient documentation

## 2020-07-14 HISTORY — DX: Personal history of other diseases of the digestive system: Z87.19

## 2020-07-14 HISTORY — DX: Myoneural disorder, unspecified: G70.9

## 2020-07-14 HISTORY — DX: Malignant (primary) neoplasm, unspecified: C80.1

## 2020-07-14 HISTORY — DX: Cardiac arrhythmia, unspecified: I49.9

## 2020-07-14 HISTORY — DX: Unspecified osteoarthritis, unspecified site: M19.90

## 2020-07-14 LAB — BASIC METABOLIC PANEL
Anion gap: 6 (ref 5–15)
BUN: 34 mg/dL — ABNORMAL HIGH (ref 8–23)
CO2: 28 mmol/L (ref 22–32)
Calcium: 8.5 mg/dL — ABNORMAL LOW (ref 8.9–10.3)
Chloride: 108 mmol/L (ref 98–111)
Creatinine, Ser: 1.74 mg/dL — ABNORMAL HIGH (ref 0.61–1.24)
GFR calc Af Amer: 39 mL/min — ABNORMAL LOW (ref >60)
GFR calc non Af Amer: 33 mL/min — ABNORMAL LOW (ref >60)
Glucose, Bld: 125 mg/dL — ABNORMAL HIGH (ref 70–99)
Potassium: 4.2 mmol/L (ref 3.5–5.1)
Sodium: 142 mmol/L (ref 135–145)

## 2020-07-14 LAB — CBC
HCT: 35.5 % — ABNORMAL LOW (ref 39.0–52.0)
Hemoglobin: 12.1 g/dL — ABNORMAL LOW (ref 13.0–17.0)
MCH: 35.1 pg — ABNORMAL HIGH (ref 26.0–34.0)
MCHC: 34.1 g/dL (ref 30.0–36.0)
MCV: 102.9 fL — ABNORMAL HIGH (ref 80.0–100.0)
Platelets: 89 10*3/uL — ABNORMAL LOW (ref 150–400)
RBC: 3.45 MIL/uL — ABNORMAL LOW (ref 4.22–5.81)
RDW: 15.8 % — ABNORMAL HIGH (ref 11.5–15.5)
WBC: 3.9 10*3/uL — ABNORMAL LOW (ref 4.0–10.5)
nRBC: 0 % (ref 0.0–0.2)

## 2020-07-14 NOTE — Progress Notes (Signed)
Called in abnormal platelet result of 89 to Dr. Marlowe Aschoff office.

## 2020-07-14 NOTE — Pre-Procedure Instructions (Signed)
Douglas Walker  07/14/2020    Your procedure is scheduled on Thursday, July 20, 2020.  Report to [facility] Entrance "A" Admitting Office at 8:30 AM.                Your surgery or procedure is scheduled to begin at 10:30 AM   Call this number if you have problems the morning of surgery: [phone] ( Pre- Surgery Desk)   Questions prior to day of surgery, please call [phone] between 8 & 4 PM.   Remember:  Do not eat after midnight Wednesday, 07/19/20.  You may drink clear liquids until 7:30 AM.  Clear liquids allowed are: Water, Juice (non-citric and without pulp - diabetics please choose diet or no sugar options), Carbonated beverages - (diabetics please choose diet or no sugar options), Clear Tea, Black Coffee only (no creamer, milk or cream including half and half) and Gatorade (diabetics please choose diet or no sugar options)    Take these medicines the morning of surgery with A SIP OF WATER: Duloxetine (Cymbalta), Levothyroxine (Synthroid), Primidone (Mysoline), Topiramate (Topamax), Acetaminophen (Tylenol), Tramadol if needed  Stop Coumadin (Warfarin) as instructed by cardiologist and start Lovenox as instructed by cardiologist. Stop Vitamins 7 days prior to surgery. Do not use Aspirin containing products, NSAIDS (Ibuprofen, Aleve, etc), Fish Oil or Herbal medications prior to surgery.      [place] - Preparing for Surgery  Before surgery, you can play an important role.  Because skin is not sterile, your skin needs to be as free of germs as possible.  You can reduce the number of germs on you skin by washing with CHG (chlorahexidine gluconate) soap before surgery.  CHG is an antiseptic cleaner which kills germs and bonds with the skin to continue killing germs even after washing.  Oral Hygiene is also important in reducing the risk of infection.  Remember to brush your teeth with your regular toothpaste the morning of surgery.  Please DO NOT use if you have an  allergy to CHG or antibacterial soaps.  If your skin becomes reddened/irritated stop using the CHG and inform your nurse when you arrive at Short Stay.  Do not shave (including legs and underarms) for at least 48 hours prior to the first CHG shower.  You may shave your face.  Please follow these instructions carefully:   1.  Shower with CHG Soap the night before surgery and the morning of Surgery.  2.  If you choose to wash your hair, wash your hair first as usual with your normal shampoo.  3.  After you shampoo, wash your face and private area with the soap you use at home, then rinse your hair and body thoroughly to remove the shampoo and soap.rinse your hair and body thoroughly to remove the shampoo. 4.  Use CHG as you would any other liquid soap.  You can apply chg directly to the skin and wash gently with a      scrungie or washcloth.           5.  Apply the CHG Soap to your body ONLY FROM THE NECK DOWN.   Do not use on open wounds or open sores. Avoid contact with your eyes, ears, mouth and genitals (private parts).   6.  Wash thoroughly, paying special attention to the area where your surgery will be performed.  7.  Thoroughly rinse your body with warm water from the neck down.  8.  DO NOT shower/wash  with your normal soap after using and rinsing off the CHG Soap.  9.  Pat yourself dry with a clean towel.            10.  Wear clean pajamas.            11.  Place clean sheets on your bed the night of your first shower and do not sleep with pets.  Day of Surgery  Shower as above  Do not wear lotions, powders, cologne or deodorant. Please wear clean clothes to the hospital. Remember to brush your teeth with toothpaste.  Do not wear jewelry.  Men may shave face and neck.  Do not bring valuables to the hospital.  [facility] is not responsible for any belongings or valuables.  Contacts, dentures or bridgework may not be worn into surgery.  Leave your suitcase in the car.  After  surgery it may be brought to your room.  For patients admitted to the hospital, discharge time will be determined by your treatment team.  Patients discharged the day of surgery will not be allowed to drive home.  Please read over the fact sheets that you were given.

## 2020-07-14 NOTE — Progress Notes (Addendum)
PCP - Josetta Huddle, MD Cardiologist - Daneen Schick, MD  PPM/ICD - Medtronic Pacemaker Device Orders - Yes Rep Notified - Yes, 07/14/20 [phone])  Chest x-ray - N/A EKG - 02/28/20 Stress Test - Denies ECHO - Denies Cardiac Cath - Denies  Sleep Study - Denies  Patient denies being diabetic.  Blood Thinner Instructions: Stop Coumadin 07/14/20; start Lovenox sq 07/18- 07/20; no Coumadin or Lovenox 07/21- 07/22 (am) Aspirin Instructions: N/A  ERAS Protcol - Yes PRE-SURGERY Ensure or G2- Not ordered.  COVID TEST- 07/17/20   Anesthesia review: Yes, cardiac hx.  Patient denies shortness of breath, fever, cough and chest pain at PAT appointment   All instructions explained to the patient, with a verbal understanding of the material. Patient agrees to go over the instructions while at home for a better understanding. Patient also instructed to self quarantine after being tested for COVID-19. The opportunity to ask questions was provided.

## 2020-07-17 ENCOUNTER — Other Ambulatory Visit (HOSPITAL_COMMUNITY)
Admission: RE | Admit: 2020-07-17 | Discharge: 2020-07-17 | Disposition: A | Discharge status: Home / Self Care | Payer: Medicare Other | Source: Ambulatory Visit | Attending: General Surgery | Admitting: General Surgery

## 2020-07-17 DIAGNOSIS — Z20822 Contact with and (suspected) exposure to covid-19: Secondary | ICD-10-CM | POA: Insufficient documentation

## 2020-07-17 DIAGNOSIS — Z01812 Encounter for preprocedural laboratory examination: Secondary | ICD-10-CM | POA: Diagnosis not present

## 2020-07-17 LAB — SARS CORONAVIRUS 2 (TAT 6-24 HRS): SARS Coronavirus 2: NEGATIVE

## 2020-07-17 NOTE — Anesthesia Preprocedure Evaluation (Addendum)
Anesthesia Evaluation  Patient identified by MRN, date of birth, ID band Patient awake    Reviewed: Allergy & Precautions, NPO status , Patient's Chart, lab work & pertinent test results  Airway Mallampati: II  TM Distance: >3 FB Neck ROM: Full    Dental  (+) Teeth Intact, Dental Advisory Given   Pulmonary former smoker,    Pulmonary exam normal breath sounds clear to auscultation       Cardiovascular hypertension, Pt. on medications + CAD and +CHF  + dysrhythmias + pacemaker  Rhythm:Irregular Rate:Abnormal     Neuro/Psych TIA Neuromuscular disease    GI/Hepatic Neg liver ROS, hiatal hernia, GERD  ,  Endo/Other  negative endocrine ROS  Renal/GU Renal disease     Musculoskeletal  (+) Arthritis ,   Abdominal   Peds  Hematology  (+) Blood dyscrasia (Warfarin), ,   Anesthesia Other Findings Day of surgery medications reviewed with the patient.  Reproductive/Obstetrics                           Anesthesia Physical Anesthesia Plan  ASA: II  Anesthesia Plan: General   Post-op Pain Management:    Induction: Intravenous  PONV Risk Score and Plan: 2 and Dexamethasone and Ondansetron  Airway Management Planned: LMA  Additional Equipment:   Intra-op Plan:   Post-operative Plan: Extubation in OR  Informed Consent: I have reviewed the patients History and Physical, chart, labs and discussed the procedure including the risks, benefits and alternatives for the proposed anesthesia with the patient or authorized representative who has indicated his/her understanding and acceptance.     Dental advisory given  Plan Discussed with: CRNA  Anesthesia Plan Comments: (PAT note by Karoline Caldwell, PA-C: Pt follows with cardiology for history of tachycardia-bradycardia syndrome with recurrent syncope s/p PPM insertion 12/04/18, atrial fibrillation on Coumadin, chronic anticoagulation therapy, prior  history of TIA,coronary artery disease.  Last seen by Dr. Tamala Julian 02/28/2020, reportedly doing well from cardiac standpoint that time.  Cardiac clearance per telephone encounter 06/30/2020.  Patient is on high Lovenox bridge per pharmacy instructions in encounter dated 07/11/2020. Coumadin LD 7/16.  CKD 3, creatinine on preop labs 1.74.   Platelets mildly low at 89K (review of history shows mild thrombocytopenia at baseline), mild anemia with hemoglobin 12.1.  Remainder of labs unremarkable.  EKG 02/28/20: Afib. V-pacing at 70bpm.   Perioperative cardiac device orders 07/13/2020: Device Information:  Clinic EP Physician:  [name], MD  Device Type:  Pacemaker Manufacturer and Phone #:  Medtronic: [phone] Pacemaker Dependent?:  Yes.   Date of Last Device Check:  07/07/2020 (remote transmission). Overdue for in-office follow-up--please contact industry representative. Normal Device Function?:  Yes.    Electrophysiologist's Recommendations:  Have magnet available. Provide continuous ECG monitoring when magnet is used or reprogramming is to be performed.  Overdue for in-office follow-up--please contact industry representative. Procedure will likely interfere with device function.  Device should be programmed:  Asynchronous pacing during procedure and returned to normal programming after procedure.  CT chest 05/17/2020: IMPRESSION: 1. There is a mass either arising from or invading the musculature immediately superficial to the ribs on the right at the T9-T10 level. This mass is solid and mildly inhomogeneous in appearance on noncontrast enhanced CT examination. This lesion measures 5.5 x 2.9 x 2.0 cm. While an inflammatory lesion could present in this manner, either a primary sarcoma or metastatic lesion could present in this manner. This lesion may well warrant tissue  sampling for further assessment given concern for potential neoplasm. No similar lesions elsewhere.  2. Scattered  granulomas. No lung edema or consolidation. No parenchymal lung nodule beyond small granulomas.  3.  No adenopathy.  4. Ascending thoracic aortic diameter of 4.7 x 4.7 cm. Ascending thoracic aortic aneurysm. Recommend semi-annual imaging followup by CTA or MRA and referral to cardiothoracic surgery if not already obtained. This recommendation follows 2010 ACCF/AHA/AATS/ACR/ASA/SCA/SCAI/SIR/STS/SVM Guidelines for the Diagnosis and Management of Patients With Thoracic Aortic Disease. Circulation. 2010; 121: V697-X480. Aortic aneurysm NOS (ICD10-I71.9). There is aortic atherosclerosis as well as foci of coronary artery calcification.  5.  Pacemaker leads attached to right atrium and right ventricle.  6. Question a degree of underlying hepatic cirrhosis. Liver has a subtly nodular contour. Appropriate laboratory correlation in this regard advised.  TTE 05/19/2015: - Left ventricle: The cavity size was normal. Systolic function was  normal. The estimated ejection fraction was in the range of 55%  to 60%. Wall motion was normal; there were no regional wall  motion abnormalities.  - Aortic valve: There was mild regurgitation.  - Left atrium: The atrium was mildly dilated.  - Atrial septum: No defect or patent foramen ovale was identified.  )       Anesthesia Quick Evaluation

## 2020-07-17 NOTE — Progress Notes (Signed)
Anesthesia Chart Review:  Pt follows with cardiology for history of tachycardia-bradycardia syndrome with recurrent syncope s/p PPM insertion 12/04/18, atrial fibrillation on Coumadin, chronic anticoagulation therapy, prior history of TIA,coronary artery disease.  Last seen by Dr. Tamala Julian 02/28/2020, reportedly doing well from cardiac standpoint that time.  Cardiac clearance per telephone encounter 06/30/2020.  Douglas Walker is on high Lovenox bridge per pharmacy instructions in encounter dated 07/11/2020. Coumadin LD 7/16.  CKD 3, creatinine on preop labs 1.74.   Platelets mildly low at 89K (review of history shows mild thrombocytopenia at baseline), mild anemia with hemoglobin 12.1.  Remainder of labs unremarkable.  EKG 02/28/20: Afib. V-pacing at 70bpm.   Perioperative cardiac device orders 07/13/2020: Device Information:  Clinic EP Physician:  [name], MD  Device Type:  Pacemaker Manufacturer and Phone #:  Medtronic: [id number] Pacemaker Dependent?:  Yes.   Date of Last Device Check:  07/07/2020 (remote transmission). Overdue for in-office follow-up--please contact industry representative. Normal Device Function?:  Yes.    Electrophysiologist's Recommendations:   Have magnet available.  Provide continuous ECG monitoring when magnet is used or reprogramming is to be performed.   Overdue for in-office follow-up--please contact industry representative.  Procedure will likely interfere with device function.  Device should be programmed:  Asynchronous pacing during procedure and returned to normal programming after procedure.  CT chest 05/17/2020: IMPRESSION: 1. There is a mass either arising from or invading the musculature immediately superficial to the ribs on the right at the T9-T10 level. This mass is solid and mildly inhomogeneous in appearance on noncontrast enhanced CT examination. This lesion measures 5.5 x 2.9 x 2.0 cm. While an inflammatory lesion could present in this  manner, either a primary sarcoma or metastatic lesion could present in this manner. This lesion may well warrant tissue sampling for further assessment given concern for potential neoplasm. No similar lesions elsewhere.  2. Scattered granulomas. No lung edema or consolidation. No parenchymal lung nodule beyond small granulomas.  3.  No adenopathy.  4. Ascending thoracic aortic diameter of 4.7 x 4.7 cm. Ascending thoracic aortic aneurysm. Recommend semi-annual imaging followup by CTA or MRA and referral to cardiothoracic surgery if not already obtained. This recommendation follows 2010 ACCF/AHA/AATS/ACR/ASA/SCA/SCAI/SIR/STS/SVM Guidelines for the Diagnosis and Management of Patients With Thoracic Aortic Disease. Circulation. 2010; 121: S937-D428. Aortic aneurysm NOS (ICD10-I71.9). There is aortic atherosclerosis as well as foci of coronary artery calcification.  5.  Pacemaker leads attached to right atrium and right ventricle.  6. Question a degree of underlying hepatic cirrhosis. Liver has a subtly nodular contour. Appropriate laboratory correlation in this regard advised.  TTE 05/19/2015: - Left ventricle: The cavity size was normal. Systolic function was  normal. The estimated ejection fraction was in the range of 55%  to 60%. Wall motion was normal; there were no regional wall  motion abnormalities.  - Aortic valve: There was mild regurgitation.  - Left atrium: The atrium was mildly dilated.  - Atrial septum: No defect or patent foramen ovale was identified.    Wynonia Musty [facility] Short Stay [facility]/Anesthesiology Phone [phone] 07/17/2020 12:58 PM

## 2020-07-19 NOTE — H&P (Signed)
Douglas Walker Appointment: 06/27/2020 9:45 AM Location: [facility] Patient #: [id number] DOB: 1925-03-09 Widowed / Language: Cleophus Molt / Race: White Male   History of Present Illness Stark Klein MD; 06/27/2020 10:09 AM) The patient is a 86 year old male who presents with a complaint of Mass. Pt is a 86 yo M who is referred by Dr. Josetta Huddle for a chest wall mass. He presents with a 2 month history of a right chest wall/back mass. He noted this around april. He felt a stinging or biting sensation in the area. When that did not go away, his son/PCP noted a mass in that region. He hasn't noted it to be getting larger. He has had some broken ribs near that area, but he isn't sure that is the exact spot. He has a history of bladder cancer, but he has no recent issues with this.   Of note, he has a pacer for tachy/brady syndrome and is on coumadin for atrial fibrillation.    CT chest without contrast 05/18/2020 anteriorly on the left.  IMPRESSION: 1. There is a mass either arising from or invading the musculature immediately superficial to the ribs on the right at the T9-T10 level. This mass is solid and mildly inhomogeneous in appearance on noncontrast enhanced CT examination. This lesion measures 5.5 x 2.9 x 2.0 cm. While an inflammatory lesion could present in this manner, either a primary sarcoma or metastatic lesion could present in this manner. This lesion may well warrant tissue sampling for further assessment given concern for potential neoplasm. No similar lesions elsewhere.  2. Scattered granulomas. No lung edema or consolidation. No parenchymal lung nodule beyond small granulomas.  3. No adenopathy.  4. Ascending thoracic aortic diameter of 4.7 x 4.7 cm. Ascending thoracic aortic aneurysm. Recommend semi-annual imaging followup by CTA or MRA and referral to cardiothoracic surgery if not already obtained. This recommendation follows  2010 ACCF/AHA/AATS/ACR/ASA/SCA/SCAI/SIR/STS/SVM Guidelines for the Diagnosis and Management of Patients With Thoracic Aortic Disease. Circulation. 2010; 121: W431-V400. Aortic aneurysm NOS (ICD10-I71.9). There is aortic atherosclerosis as well as foci of coronary artery calcification.  5. Pacemaker leads attached to right atrium and right ventricle.  6. Question a degree of underlying hepatic cirrhosis. Liver has a subtly nodular contour. Appropriate laboratory correlation in this regard advised.  Aortic Atherosclerosis (ICD10-I70.0).   Past Surgical History (April Staton, [place]; 06/27/2020 9:38 AM) Appendectomy  Cataract Surgery  Bilateral. Open Inguinal Hernia Surgery  Bilateral. Oral Surgery   Diagnostic Studies History (April Staton, CMA; 06/27/2020 9:38 AM) Colonoscopy  >10 years ago  Allergies (April Staton, CMA; 06/27/2020 9:40 AM) Sulfa Antibiotics   Social History (April Staton, CMA; 06/27/2020 9:38 AM) Alcohol use  Remotely quit alcohol use. Caffeine use  Carbonated beverages, Tea. No drug use  Tobacco use  Former smoker.  Family History (April Staton, [place]; 06/27/2020 9:38 AM) Arthritis  Mother, Sister. Diabetes Mellitus  Son.  Other Problems (April Staton, CMA; 06/27/2020 9:38 AM) Arthritis  Back Pain  Bladder Problems  Cancer  Cerebrovascular Accident  Chest pain  Congestive Heart Failure  Hemorrhoids  High blood pressure  Hypercholesterolemia  Inguinal Hernia  Melanoma  Thyroid Disease     Review of Systems (April Staton CMA; 06/27/2020 9:38 AM) General Not Present- Appetite Loss, Chills, Fatigue, Fever, Night Sweats, Weight Gain and Weight Loss. Skin Not Present- Change in Wart/Mole, Dryness, Hives, Jaundice, New Lesions, Non-Healing Wounds, Rash and Ulcer. HEENT Present- Hearing Loss and Wears glasses/contact lenses. Not Present- Earache, Hoarseness, Nose Bleed,  Oral Ulcers, Ringing in the Ears, Seasonal Allergies, Sinus Pain,  Sore Throat, Visual Disturbances and Yellow Eyes. Cardiovascular Present- Swelling of Extremities. Not Present- Chest Pain, Difficulty Breathing Lying Down, Leg Cramps, Palpitations, Rapid Heart Rate and Shortness of Breath. Gastrointestinal Not Present- Abdominal Pain, Bloating, Bloody Stool, Change in Bowel Habits, Chronic diarrhea, Constipation, Difficulty Swallowing, Excessive gas, Gets full quickly at meals, Hemorrhoids, Indigestion, Nausea, Rectal Pain and Vomiting. Musculoskeletal Present- Joint Stiffness, Muscle Pain, Muscle Weakness and Swelling of Extremities. Not Present- Back Pain and Joint Pain. Neurological Present- Numbness, Tremor and Trouble walking. Not Present- Decreased Memory, Fainting, Headaches, Seizures, Tingling and Weakness. Hematology Present- Blood Thinners and Easy Bruising. Not Present- Excessive bleeding, Gland problems, HIV and Persistent Infections.  Vitals (April Staton CMA; 06/27/2020 9:41 AM) 06/27/2020 9:41 AM Weight: 200.38 lb Height: 69in Body Surface Area: 2.07 m Body Mass Index: 29.59 kg/m  Temp.: 60F (Temporal)  Pulse: 70 (Regular)  P.OX: 98% (Room air) BP: 110/60(Sitting, Left Arm, Standard)       Physical Exam Stark Klein MD; 06/27/2020 10:11 AM) General Mental Status-Alert. General Appearance-Consistent with stated age. Hydration-Well hydrated. Voice-Normal.  Head and Neck Head-normocephalic, atraumatic with no lesions or palpable masses. Trachea-midline. Thyroid Gland Characteristics - normal size and consistency.  Eye Eyeball - Bilateral-Extraocular movements intact. Sclera/Conjunctiva - Bilateral-No scleral icterus.  Chest and Lung Exam Chest and lung exam reveals -quiet, even and easy respiratory effort with no use of accessory muscles and on auscultation, normal breath sounds, no adventitious sounds and normal vocal resonance. Inspection Chest Wall - Normal. Back - normal. Note: mass noted on  inspection of the right back/chest wall just behind the posterior axillary line. this does not invade the skin. It is a bit mobile, but does seem to tug on the ribs. It is non tender. there are no overlying skin changes.   Cardiovascular Cardiovascular examination reveals -normal heart sounds, regular rate and rhythm with no murmurs and normal pedal pulses bilaterally. Note: left sided pacer in place in the infraclavicular region.   Abdomen Inspection Inspection of the abdomen reveals - No Hernias. Palpation/Percussion Palpation and Percussion of the abdomen reveal - Soft, Non Tender, No Rebound tenderness, No Rigidity (guarding) and No hepatosplenomegaly. Auscultation Auscultation of the abdomen reveals - Bowel sounds normal.  Neurologic Neurologic evaluation reveals -alert and oriented x 3 with no impairment of recent or remote memory. Mental Status-Normal. Note: hard of hearing. hearing aids in place.   Musculoskeletal Global Assessment -Note: no gross deformities.  Normal Exam - Left-Upper Extremity Strength Normal and Lower Extremity Strength Normal. Normal Exam - Right-Upper Extremity Strength Normal and Lower Extremity Strength Normal.  Lymphatic Head & Neck  General Head & Neck Lymphatics: Bilateral - Description - Normal. Axillary  General Axillary Region: Bilateral - Description - Normal. Tenderness - Non Tender. Femoral & Inguinal  Generalized Femoral & Inguinal Lymphatics: Bilateral - Description - No Generalized lymphadenopathy.    Assessment & Plan Stark Klein MD; 06/27/2020 10:19 AM) MASS OF CHEST WALL, RIGHT (R22.2) Impression: This is concerning for a sarcoma. I do think it has grown a bit superficially compared to the CT in May. Will try to do this expeditiously. I reviewed risks of surgery. My main concerns are bleeding (due to anticoagulation), diagnosis of cancer with risk of positive margin and/or recurrence, and chronic pain. He also  has risks of wound issues like infection or breakdown based on the location. He uses a walker and the stress of getting up and down may tug  on the wound too much.  He will have potential cardiac risks as well. I have messaged Dr. Tamala Julian to see if he needs any risk stratification testing.  He will also need to hold anticoagulation.  He may need radiation if this is an aggressive lesion or if margins are close. Current Plans You are being scheduled for surgery- Our schedulers will call you.  You should hear from our office's scheduling department within 5 working days about the location, date, and time of surgery. We try to make accommodations for patient's preferences in scheduling surgery, but sometimes the OR schedule or the surgeon's schedule prevents [place] from making those accommodations.  If you have not heard from our office [phone]) in 5 working days, call the office and ask for your surgeon's nurse.  If you have other questions about your diagnosis, plan, or surgery, call the office and ask for your surgeon's nurse.  Pt Education - [facility] General Post-op HCI WARFARIN ANTICOAGULATION (Z79.01) Impression: see above. will need to hold pre op for 4 days. ATRIAL FIBRILLATION (I48.91) PACEMAKER (Z95.0) Impression: Anesthesia will need to know what to do with pacemaker.    Signed electronically by Stark Klein, MD (06/27/2020 10:19 AM)

## 2020-07-20 ENCOUNTER — Other Ambulatory Visit: Payer: Self-pay

## 2020-07-20 ENCOUNTER — Encounter (HOSPITAL_COMMUNITY)
Admission: RE | Disposition: A | Discharge status: Home / Self Care | Payer: Self-pay | Source: Home / Self Care | Attending: General Surgery

## 2020-07-20 ENCOUNTER — Telehealth: Payer: Self-pay | Admitting: Pharmacist

## 2020-07-20 ENCOUNTER — Ambulatory Visit (HOSPITAL_COMMUNITY)
Admission: RE | Admit: 2020-07-20 | Discharge: 2020-07-20 | Disposition: A | Discharge status: Home / Self Care | Payer: Medicare Other | Attending: General Surgery | Admitting: General Surgery

## 2020-07-20 ENCOUNTER — Encounter (HOSPITAL_COMMUNITY): Payer: Self-pay | Admitting: General Surgery

## 2020-07-20 ENCOUNTER — Ambulatory Visit (HOSPITAL_COMMUNITY): Payer: Medicare Other

## 2020-07-20 ENCOUNTER — Ambulatory Visit (HOSPITAL_COMMUNITY): Payer: Medicare Other | Admitting: Vascular Surgery

## 2020-07-20 DIAGNOSIS — I130 Hypertensive heart and chronic kidney disease with heart failure and stage 1 through stage 4 chronic kidney disease, or unspecified chronic kidney disease: Secondary | ICD-10-CM | POA: Diagnosis not present

## 2020-07-20 DIAGNOSIS — I495 Sick sinus syndrome: Secondary | ICD-10-CM | POA: Diagnosis not present

## 2020-07-20 DIAGNOSIS — H9190 Unspecified hearing loss, unspecified ear: Secondary | ICD-10-CM | POA: Insufficient documentation

## 2020-07-20 DIAGNOSIS — R222 Localized swelling, mass and lump, trunk: Secondary | ICD-10-CM | POA: Diagnosis not present

## 2020-07-20 DIAGNOSIS — Z87891 Personal history of nicotine dependence: Secondary | ICD-10-CM | POA: Diagnosis not present

## 2020-07-20 DIAGNOSIS — I509 Heart failure, unspecified: Secondary | ICD-10-CM | POA: Insufficient documentation

## 2020-07-20 DIAGNOSIS — M728 Other fibroblastic disorders: Secondary | ICD-10-CM | POA: Diagnosis not present

## 2020-07-20 DIAGNOSIS — M729 Fibroblastic disorder, unspecified: Secondary | ICD-10-CM | POA: Diagnosis not present

## 2020-07-20 DIAGNOSIS — Z8551 Personal history of malignant neoplasm of bladder: Secondary | ICD-10-CM | POA: Diagnosis not present

## 2020-07-20 DIAGNOSIS — I2510 Atherosclerotic heart disease of native coronary artery without angina pectoris: Secondary | ICD-10-CM | POA: Insufficient documentation

## 2020-07-20 DIAGNOSIS — Z8673 Personal history of transient ischemic attack (TIA), and cerebral infarction without residual deficits: Secondary | ICD-10-CM | POA: Diagnosis not present

## 2020-07-20 DIAGNOSIS — I5032 Chronic diastolic (congestive) heart failure: Secondary | ICD-10-CM | POA: Diagnosis not present

## 2020-07-20 DIAGNOSIS — Z950 Presence of cardiac pacemaker: Secondary | ICD-10-CM | POA: Insufficient documentation

## 2020-07-20 DIAGNOSIS — I4891 Unspecified atrial fibrillation: Secondary | ICD-10-CM | POA: Insufficient documentation

## 2020-07-20 DIAGNOSIS — Z85820 Personal history of malignant melanoma of skin: Secondary | ICD-10-CM | POA: Insufficient documentation

## 2020-07-20 DIAGNOSIS — I110 Hypertensive heart disease with heart failure: Secondary | ICD-10-CM | POA: Insufficient documentation

## 2020-07-20 DIAGNOSIS — Z7901 Long term (current) use of anticoagulants: Secondary | ICD-10-CM | POA: Diagnosis not present

## 2020-07-20 DIAGNOSIS — N183 Chronic kidney disease, stage 3 unspecified: Secondary | ICD-10-CM | POA: Diagnosis not present

## 2020-07-20 HISTORY — PX: MASS EXCISION: SHX2000

## 2020-07-20 LAB — PROTIME-INR
INR: 1.1 (ref 0.8–1.2)
Prothrombin Time: 13.8 seconds (ref 11.4–15.2)

## 2020-07-20 SURGERY — EXCISION MASS
Anesthesia: General | Site: Flank | Laterality: Right

## 2020-07-20 MED ORDER — ORAL CARE MOUTH RINSE: 15.0000 mL | Freq: Once | OROMUCOSAL | Status: AC

## 2020-07-20 MED ORDER — CHLORHEXIDINE GLUCONATE 0.12 % MT SOLN
15.0000 mL | Freq: Once | OROMUCOSAL | Status: AC
  Administered 2020-07-20: 15 mL via OROMUCOSAL
  Filled 2020-07-20: qty 15

## 2020-07-20 MED ORDER — PROPOFOL 10 MG/ML IV BOLUS
INTRAVENOUS | Status: AC
  Filled 2020-07-20: qty 20

## 2020-07-20 MED ORDER — BUPIVACAINE LIPOSOME 1.3 % IJ SUSP
20.0000 mL | Freq: Once | INTRAMUSCULAR | Status: DC
  Filled 2020-07-20: qty 20

## 2020-07-20 MED ORDER — LIDOCAINE 2% (20 MG/ML) 5 ML SYRINGE
INTRAMUSCULAR | Status: AC
  Filled 2020-07-20: qty 5

## 2020-07-20 MED ORDER — ROCURONIUM BROMIDE 10 MG/ML (PF) SYRINGE
PREFILLED_SYRINGE | INTRAVENOUS | Status: AC
  Filled 2020-07-20: qty 10

## 2020-07-20 MED ORDER — CEFAZOLIN SODIUM-DEXTROSE 2-4 GM/100ML-% IV SOLN
2.0000 g | INTRAVENOUS | Status: AC
  Administered 2020-07-20: 2 g via INTRAVENOUS
  Filled 2020-07-20: qty 100

## 2020-07-20 MED ORDER — LACTATED RINGERS IV SOLN: INTRAVENOUS | Status: DC

## 2020-07-20 MED ORDER — LIDOCAINE 2% (20 MG/ML) 5 ML SYRINGE
INTRAMUSCULAR | Status: DC | PRN
  Administered 2020-07-20: 80 mg via INTRAVENOUS

## 2020-07-20 MED ORDER — ROCURONIUM BROMIDE 10 MG/ML (PF) SYRINGE
PREFILLED_SYRINGE | INTRAVENOUS | Status: DC | PRN
  Administered 2020-07-20: 50 mg via INTRAVENOUS

## 2020-07-20 MED ORDER — ONDANSETRON HCL 4 MG/2ML IJ SOLN
INTRAMUSCULAR | Status: DC | PRN
  Administered 2020-07-20: 4 mg via INTRAVENOUS

## 2020-07-20 MED ORDER — BUPIVACAINE LIPOSOME 1.3 % IJ SUSP
INTRAMUSCULAR | Status: DC | PRN
  Administered 2020-07-20: 10 mL

## 2020-07-20 MED ORDER — TRAMADOL HCL 50 MG PO TABS: 50.0000 mg | ORAL_TABLET | Freq: Four times a day (QID) | ORAL | 0 refills | Status: DC | PRN

## 2020-07-20 MED ORDER — CHLORHEXIDINE GLUCONATE CLOTH 2 % EX PADS: 6.0000 | MEDICATED_PAD | Freq: Once | CUTANEOUS | Status: DC

## 2020-07-20 MED ORDER — LIDOCAINE-EPINEPHRINE 1 %-1:100000 IJ SOLN
INTRAMUSCULAR | Status: AC
  Filled 2020-07-20: qty 1

## 2020-07-20 MED ORDER — FENTANYL CITRATE (PF) 250 MCG/5ML IJ SOLN
INTRAMUSCULAR | Status: AC
  Filled 2020-07-20: qty 5

## 2020-07-20 MED ORDER — SUGAMMADEX SODIUM 200 MG/2ML IV SOLN
INTRAVENOUS | Status: DC | PRN
  Administered 2020-07-20: 200 mg via INTRAVENOUS

## 2020-07-20 MED ORDER — BUPIVACAINE HCL (PF) 0.25 % IJ SOLN
INTRAMUSCULAR | Status: AC
  Filled 2020-07-20: qty 30

## 2020-07-20 MED ORDER — PHENYLEPHRINE HCL (PRESSORS) 10 MG/ML IV SOLN
INTRAVENOUS | Status: DC | PRN
  Administered 2020-07-20: 40 ug via INTRAVENOUS

## 2020-07-20 MED ORDER — LIDOCAINE HCL 1 % IJ SOLN
INTRAMUSCULAR | Status: DC | PRN
  Administered 2020-07-20: 7 mL

## 2020-07-20 MED ORDER — ACETAMINOPHEN 500 MG PO TABS
1000.0000 mg | ORAL_TABLET | ORAL | Status: DC
  Filled 2020-07-20: qty 2

## 2020-07-20 MED ORDER — FENTANYL CITRATE (PF) 100 MCG/2ML IJ SOLN
INTRAMUSCULAR | Status: DC | PRN
  Administered 2020-07-20: 50 ug via INTRAVENOUS

## 2020-07-20 MED ORDER — PROPOFOL 10 MG/ML IV BOLUS
INTRAVENOUS | Status: DC | PRN
  Administered 2020-07-20: 100 mg via INTRAVENOUS

## 2020-07-20 MED ORDER — OXYCODONE HCL 5 MG PO TABS
2.5000 mg | ORAL_TABLET | Freq: Four times a day (QID) | ORAL | 0 refills | Status: DC | PRN
Start: 2020-07-20 — End: 2020-11-02

## 2020-07-20 SURGICAL SUPPLY — 42 items
BENZOIN TINCTURE PRP APPL 2/3 (GAUZE/BANDAGES/DRESSINGS) ×3 IMPLANT
CANISTER SUCT 3000ML PPV (MISCELLANEOUS) ×3 IMPLANT
CHLORAPREP W/TINT 26 (MISCELLANEOUS) ×3 IMPLANT
CLIP VESOCCLUDE LG 6/CT (CLIP) ×3 IMPLANT
CLOSURE STERI-STRIP 1/4X4 (GAUZE/BANDAGES/DRESSINGS) ×3 IMPLANT
CLOSURE WOUND 1/2 X4 (GAUZE/BANDAGES/DRESSINGS)
COVER SURGICAL LIGHT HANDLE (MISCELLANEOUS) ×3 IMPLANT
COVER WAND RF STERILE (DRAPES) ×3 IMPLANT
DERMABOND ADVANCED (GAUZE/BANDAGES/DRESSINGS)
DERMABOND ADVANCED .7 DNX12 (GAUZE/BANDAGES/DRESSINGS) IMPLANT
DRAPE LAPAROSCOPIC ABDOMINAL (DRAPES) IMPLANT
DRAPE LAPAROTOMY 100X72 PEDS (DRAPES) IMPLANT
DRSG TEGADERM 4X10 (GAUZE/BANDAGES/DRESSINGS) ×3 IMPLANT
DRSG TEGADERM 4X4.75 (GAUZE/BANDAGES/DRESSINGS) IMPLANT
ELECT REM PT RETURN 9FT ADLT (ELECTROSURGICAL) ×3
ELECTRODE REM PT RTRN 9FT ADLT (ELECTROSURGICAL) ×1 IMPLANT
GAUZE 4X4 16PLY RFD (DISPOSABLE) ×3 IMPLANT
GAUZE SPONGE 4X4 12PLY STRL (GAUZE/BANDAGES/DRESSINGS) ×3 IMPLANT
GLOVE BIO SURGEON STRL SZ 6 (GLOVE) ×3 IMPLANT
GLOVE INDICATOR 6.5 STRL GRN (GLOVE) ×3 IMPLANT
GOWN STRL REUS W/ TWL LRG LVL3 (GOWN DISPOSABLE) ×1 IMPLANT
GOWN STRL REUS W/TWL 2XL LVL3 (GOWN DISPOSABLE) ×3 IMPLANT
GOWN STRL REUS W/TWL LRG LVL3 (GOWN DISPOSABLE) ×2
KIT BASIN OR (CUSTOM PROCEDURE TRAY) ×3 IMPLANT
KIT TURNOVER KIT B (KITS) ×3 IMPLANT
NEEDLE HYPO 25GX1X1/2 BEV (NEEDLE) ×3 IMPLANT
NS IRRIG 1000ML POUR BTL (IV SOLUTION) ×3 IMPLANT
PACK GENERAL/GYN (CUSTOM PROCEDURE TRAY) ×3 IMPLANT
PAD ARMBOARD 7.5X6 YLW CONV (MISCELLANEOUS) ×6 IMPLANT
PENCIL SMOKE EVACUATOR (MISCELLANEOUS) ×3 IMPLANT
SPECIMEN JAR SMALL (MISCELLANEOUS) ×6 IMPLANT
SPONGE LAP 4X18 RFD (DISPOSABLE) IMPLANT
STRIP CLOSURE SKIN 1/2X4 (GAUZE/BANDAGES/DRESSINGS) IMPLANT
SUT MON AB 4-0 PC3 18 (SUTURE) ×3 IMPLANT
SUT SILK 2 0 PERMA HAND 18 BK (SUTURE) IMPLANT
SUT VIC AB 2-0 SH 27 (SUTURE) ×3
SUT VIC AB 2-0 SH 27XBRD (SUTURE) ×1 IMPLANT
SUT VIC AB 3-0 SH 27 (SUTURE) ×2
SUT VIC AB 3-0 SH 27X BRD (SUTURE) ×1 IMPLANT
SYR CONTROL 10ML LL (SYRINGE) ×3 IMPLANT
TOWEL GREEN STERILE (TOWEL DISPOSABLE) ×3 IMPLANT
TOWEL GREEN STERILE FF (TOWEL DISPOSABLE) ×3 IMPLANT

## 2020-07-20 NOTE — Transfer of Care (Signed)
Immediate Anesthesia Transfer of Care Note  Patient: Douglas Walker  Procedure(s) Performed: EXCISION OF RIGHT CHEST WALL MASS (Right Flank)  Patient Location: PACU  Anesthesia Type:General  Level of Consciousness: awake and drowsy  Airway & Oxygen Therapy: Patient Spontanous Breathing and Patient connected to face mask oxygen  Post-op Assessment: Report given to RN and Post -op Vital signs reviewed and stable  Post vital signs: Reviewed and stable  Last Vitals:  Vitals Value Taken Time  BP 135/77 07/20/20 1106  Temp    Pulse 67 07/20/20 1110  Resp 17 07/20/20 1110  SpO2 100 % 07/20/20 1110  Vitals shown include unvalidated device data.  Last Pain:  Vitals:   07/20/20 0940  PainSc: 0-No pain         Complications: No complications documented.

## 2020-07-20 NOTE — Telephone Encounter (Signed)
Son called stating the surgeon told his father to resume warfarin tomorrow but we said tonight. Advised it was ok to resume tomorrow. Give an extra 1/2 tablet tomorrow night and Saturday instead. Resume lovenox tomorrow. Keep same follow up appointment.

## 2020-07-20 NOTE — Anesthesia Procedure Notes (Signed)
Procedure Name: Intubation Date/Time: 07/20/2020 10:00 AM Performed by: Trinna Post., CRNA Pre-anesthesia Checklist: Patient identified, Emergency Drugs available, Suction available and Patient being monitored Patient Re-evaluated:Patient Re-evaluated prior to induction Oxygen Delivery Method: Circle system utilized Preoxygenation: Pre-oxygenation with 100% oxygen Induction Type: IV induction Ventilation: Mask ventilation without difficulty Laryngoscope Size: Miller and 3 Grade View: Grade I Tube type: Oral Tube size: 7.5 mm Number of attempts: 1 Airway Equipment and Method: Stylet and Oral airway Placement Confirmation: ETT inserted through vocal cords under direct vision,  positive ETCO2 and breath sounds checked- equal and bilateral Secured at: 23 cm Tube secured with: Tape Dental Injury: Teeth and Oropharynx as per pre-operative assessment  Comments: Placed by Rexford Maus, SRNA

## 2020-07-20 NOTE — Anesthesia Postprocedure Evaluation (Signed)
Anesthesia Post Note  Patient: Douglas Walker  Procedure(s) Performed: EXCISION OF RIGHT CHEST WALL MASS (Right Flank)     Patient location during evaluation: PACU Anesthesia Type: General Level of consciousness: awake and alert Pain management: pain level controlled Vital Signs Assessment: post-procedure vital signs reviewed and stable Respiratory status: spontaneous breathing, nonlabored ventilation, respiratory function stable and patient connected to nasal cannula oxygen Cardiovascular status: blood pressure returned to baseline and stable Postop Assessment: no apparent nausea or vomiting Anesthetic complications: no   No complications documented.  Last Vitals:  Vitals:   07/20/20 1137 07/20/20 1154  BP: (!) 133/74 (!) 148/84  Pulse: 69 70  Resp: 16 16  Temp:  36.7 C  SpO2: 98% 94%    Last Pain:  Vitals:   07/20/20 1154  PainSc: 0-No pain                 Catalina Gravel

## 2020-07-20 NOTE — Op Note (Signed)
PRE-OPERATIVE DIAGNOSIS: right chest wall/flank mass  POST-OPERATIVE DIAGNOSIS:  Same  PROCEDURE:  Procedure(s): Radical excision of right posterior chest wall/flank mass, subfascial, 4x2.5 cm  SURGEON:  Surgeon(s): Stark Klein, MD  ASSIST: Judyann Munson, RNFA  ANESTHESIA:   local and general  DRAINS: none   LOCAL MEDICATIONS USED:  OTHER exparel  SPECIMEN:  Source of Specimen:  right flank mass, additional deep margin  DISPOSITION OF SPECIMEN:  PATHOLOGY  COUNTS:  YES  DICTATION: .Dragon Dictation  PLAN OF CARE: Discharge to home after PACU  PATIENT DISPOSITION:  PACU - hemodynamically stable.  FINDINGS:  Firm subfascial mass in latissimus extending into intercostal muscles  EBL: min  PROCEDURE:   Patient was identified in the holding area and taken the operating room where he was placed supine on the operating room table. General anesthesia was induced. He was then placed into the lateral position with the left side down. The right posterior chest wall and flank were prepped and draped in sterile fashion. A timeout was performed according to the surgical safety checklist. When all was correct, we continued.  An oblique incision was made overlying the mass. This was noted to be firm and fixed. The skin hooks were used to elevate the skin and flaps were created around the mass. Care was taken to remain away from the mass given the characteristics of it. The dissection was continued down to the latissimus muscle and through the latissimus muscle with the cautery. Once it felt that we were completely around the mass, the tissue was elevated with Allis clamps and the cautery was used to dissect the deep portion of the mass. Care was taken to avoid clamping into the mass and only onto the surrounding muscular and fatty tissue.  This was taken directly off the ribs and some of the intercostal muscle came with this. This was marked with sutures with short stitch superior, long  stitch lateral, and single stitch deep margin. The deep margin is adjacent to the ribs.  There was some additional firm tissue laterally. This was elevated and taken back to grossly normal tissue. This dissection was performed with the cautery. At this point the wound was irrigated and hemostasis was achieved with cautery. The edges of the cavity were marked with large metal clips.  The skin was then closed with 2-0 Vicryl deep sutures followed by 3-0 Vicryl interrupted deep dermal sutures and 4-0 Monocryl running subcuticular suture.   The wound was then cleaned, dried, and dressed with benzoin, Steri-Strips, gauze, and Tegaderm. The patient was placed back supine and allowed to emerge from general anesthesia. He was then taken to the PACU in stable condition. Needle, sponge, and instrument counts were correct.

## 2020-07-20 NOTE — Telephone Encounter (Signed)
Patient's son is calling to follow up in regards to coumadin. He states the office performing the procedure advised for the patient to hold coumadin. Please call to discuss.

## 2020-07-20 NOTE — Discharge Instructions (Addendum)
Restart coumadin tomorrow, but no not load (take higher dose) than normal.    [employer] Phone Number [phone]   POST OP INSTRUCTIONS  Always review your discharge instruction sheet given to you by the facility where your surgery was performed.  IF YOU HAVE DISABILITY OR FAMILY LEAVE FORMS, YOU MUST BRING THEM TO THE OFFICE FOR PROCESSING.  DO NOT GIVE THEM TO YOUR DOCTOR.  1. A prescription for pain medication may be given to you upon discharge.  Take your pain medication as prescribed, if needed.  If narcotic pain medicine is not needed, then you may take acetaminophen (Tylenol) or ibuprofen (Advil) as needed. 2. Take your usually prescribed medications unless otherwise directed 3. If you need a refill on your pain medication, please contact your pharmacy.  They will contact our office to request authorization.  Prescriptions will not be filled after 5pm or on week-ends. 4. You should eat very light the first 24 hours after surgery, such as soup, crackers, pudding, etc.  Resume your normal diet the day after surgery 5. It is common to experience some constipation if taking pain medication after surgery.  Increasing fluid intake and taking a stool softener will usually help or prevent this problem from occurring.  A mild laxative (Milk of Magnesia or Miralax) should be taken according to package directions if there are no bowel movements after 48 hours. 6. You may shower in 48 hours.  After that you may take off outer dressing.  The strips will fall off on their own over the next few weeks.     7. ACTIVITIES:  No strenuous activity or heavy lifting for 3 week.   a. You may drive when you no longer are taking prescription pain medication, you can comfortably wear a seatbelt, and you can safely maneuver your car and apply brakes. b. RETURN TO WORK:  __________n/a_______________ Dennis Bast should see your doctor in the office for a follow-up appointment approximately three-four  weeks after your surgery.    WHEN TO CALL YOUR DOCTOR: 1. Fever over 101.0 2. Nausea and/or vomiting. 3. Extreme swelling or bruising. 4. Continued bleeding from incision. 5. Increased pain, redness, or drainage from the incision.  The clinic staff is available to answer your questions during regular business hours.  Please don't hesitate to call and ask to speak to one of the nurses for clinical concerns.  If you have a medical emergency, go to the nearest emergency room or call 911.  A surgeon from [facility] is always on call at the hospital.  For further questions, please visit [email or web address]

## 2020-07-21 ENCOUNTER — Encounter (HOSPITAL_COMMUNITY): Payer: Self-pay | Admitting: General Surgery

## 2020-07-21 NOTE — Telephone Encounter (Signed)
Returned call to Douglas Walker pt's son and dtr in law who are both on the [place] as I was on speaker phone by them. Advised that the pt was to take an extra 1/2 tablet for 2 days, a total of 7.5mg  today and 10mg  tomorrow since resuming Warfarin today per the physician then the pt will resume normal dose of warfarin, which is 7.5mg  daily except 5mg  Monday and Friday.   Also, they wanted to know about Lovenox since the pt will not have enough syringes until appt, advised that the pt has a refill on the prescription so they can get more at the pharmacy. Advised that they can pick up a couple syringes or the entire box. Explained to them that he will be on the Lovenox until INR is at 2.0 or greater and they verbalized understanding. Advised that if INR is less than 2.0 he will have to remain on it as it is protecting him from a clot or stroke while Warfarin is building up in the system and they verbalized understanding. They were thankful for the assistance and advised to call back if needed.

## 2020-07-21 NOTE — Telephone Encounter (Signed)
Pt has appt at [facility] 07/27/2020@10 :15 AM will discuss warfarin directions then.  Notified Korben Carcione of appt he states that he is going to be short of pt's Lovenox before his appointment. If he gets a refill he think that it will be too many. Please call pt.

## 2020-07-21 NOTE — Telephone Encounter (Signed)
Called [facility] and informed chris that I forwarded message

## 2020-07-24 ENCOUNTER — Encounter (HOSPITAL_COMMUNITY): Payer: Self-pay | Admitting: Surgery

## 2020-07-24 ENCOUNTER — Inpatient Hospital Stay (HOSPITAL_COMMUNITY)
Admission: AD | Admit: 2020-07-24 | Discharge: 2020-07-28 | DRG: 921 | Disposition: A | Discharge status: Skilled Nursing Facility | Payer: Medicare Other | Source: Ambulatory Visit | Attending: Surgery | Admitting: Surgery

## 2020-07-24 ENCOUNTER — Inpatient Hospital Stay (HOSPITAL_COMMUNITY): Payer: Medicare Other | Admitting: Certified Registered"

## 2020-07-24 ENCOUNTER — Other Ambulatory Visit: Payer: Self-pay

## 2020-07-24 ENCOUNTER — Encounter (HOSPITAL_COMMUNITY): Admission: AD | Disposition: A | Discharge status: Skilled Nursing Facility | Payer: Self-pay | Source: Ambulatory Visit

## 2020-07-24 DIAGNOSIS — I129 Hypertensive chronic kidney disease with stage 1 through stage 4 chronic kidney disease, or unspecified chronic kidney disease: Secondary | ICD-10-CM | POA: Diagnosis present

## 2020-07-24 DIAGNOSIS — I10 Essential (primary) hypertension: Secondary | ICD-10-CM | POA: Diagnosis not present

## 2020-07-24 DIAGNOSIS — H8110 Benign paroxysmal vertigo, unspecified ear: Secondary | ICD-10-CM | POA: Diagnosis not present

## 2020-07-24 DIAGNOSIS — Z79891 Long term (current) use of opiate analgesic: Secondary | ICD-10-CM

## 2020-07-24 DIAGNOSIS — Z79890 Hormone replacement therapy (postmenopausal): Secondary | ICD-10-CM | POA: Diagnosis not present

## 2020-07-24 DIAGNOSIS — Z79899 Other long term (current) drug therapy: Secondary | ICD-10-CM

## 2020-07-24 DIAGNOSIS — R400 Somnolence: Secondary | ICD-10-CM | POA: Diagnosis not present

## 2020-07-24 DIAGNOSIS — M479 Spondylosis, unspecified: Secondary | ICD-10-CM | POA: Diagnosis present

## 2020-07-24 DIAGNOSIS — N183 Chronic kidney disease, stage 3 unspecified: Secondary | ICD-10-CM | POA: Diagnosis present

## 2020-07-24 DIAGNOSIS — I480 Paroxysmal atrial fibrillation: Secondary | ICD-10-CM | POA: Diagnosis present

## 2020-07-24 DIAGNOSIS — Z8673 Personal history of transient ischemic attack (TIA), and cerebral infarction without residual deficits: Secondary | ICD-10-CM

## 2020-07-24 DIAGNOSIS — G252 Other specified forms of tremor: Secondary | ICD-10-CM | POA: Diagnosis not present

## 2020-07-24 DIAGNOSIS — M19012 Primary osteoarthritis, left shoulder: Secondary | ICD-10-CM | POA: Diagnosis present

## 2020-07-24 DIAGNOSIS — I2510 Atherosclerotic heart disease of native coronary artery without angina pectoris: Secondary | ICD-10-CM | POA: Diagnosis present

## 2020-07-24 DIAGNOSIS — S20211A Contusion of right front wall of thorax, initial encounter: Secondary | ICD-10-CM | POA: Diagnosis present

## 2020-07-24 DIAGNOSIS — S20219A Contusion of unspecified front wall of thorax, initial encounter: Secondary | ICD-10-CM | POA: Diagnosis present

## 2020-07-24 DIAGNOSIS — M96840 Postprocedural hematoma of a musculoskeletal structure following a musculoskeletal system procedure: Principal | ICD-10-CM | POA: Diagnosis present

## 2020-07-24 DIAGNOSIS — Z950 Presence of cardiac pacemaker: Secondary | ICD-10-CM

## 2020-07-24 DIAGNOSIS — I5032 Chronic diastolic (congestive) heart failure: Secondary | ICD-10-CM | POA: Diagnosis not present

## 2020-07-24 DIAGNOSIS — M545 Low back pain: Secondary | ICD-10-CM | POA: Diagnosis not present

## 2020-07-24 DIAGNOSIS — Z87891 Personal history of nicotine dependence: Secondary | ICD-10-CM | POA: Diagnosis not present

## 2020-07-24 DIAGNOSIS — M7989 Other specified soft tissue disorders: Secondary | ICD-10-CM | POA: Diagnosis not present

## 2020-07-24 DIAGNOSIS — I130 Hypertensive heart and chronic kidney disease with heart failure and stage 1 through stage 4 chronic kidney disease, or unspecified chronic kidney disease: Secondary | ICD-10-CM | POA: Diagnosis not present

## 2020-07-24 DIAGNOSIS — H3530 Unspecified macular degeneration: Secondary | ICD-10-CM | POA: Diagnosis present

## 2020-07-24 DIAGNOSIS — Z7401 Bed confinement status: Secondary | ICD-10-CM | POA: Diagnosis not present

## 2020-07-24 DIAGNOSIS — Z20822 Contact with and (suspected) exposure to covid-19: Secondary | ICD-10-CM | POA: Diagnosis present

## 2020-07-24 DIAGNOSIS — I959 Hypotension, unspecified: Secondary | ICD-10-CM | POA: Diagnosis not present

## 2020-07-24 DIAGNOSIS — Z821 Family history of blindness and visual loss: Secondary | ICD-10-CM | POA: Diagnosis not present

## 2020-07-24 DIAGNOSIS — G8929 Other chronic pain: Secondary | ICD-10-CM | POA: Diagnosis present

## 2020-07-24 DIAGNOSIS — L7622 Postprocedural hemorrhage and hematoma of skin and subcutaneous tissue following other procedure: Secondary | ICD-10-CM | POA: Diagnosis not present

## 2020-07-24 DIAGNOSIS — G629 Polyneuropathy, unspecified: Secondary | ICD-10-CM | POA: Diagnosis not present

## 2020-07-24 DIAGNOSIS — N400 Enlarged prostate without lower urinary tract symptoms: Secondary | ICD-10-CM | POA: Diagnosis not present

## 2020-07-24 DIAGNOSIS — M19011 Primary osteoarthritis, right shoulder: Secondary | ICD-10-CM | POA: Diagnosis present

## 2020-07-24 DIAGNOSIS — E785 Hyperlipidemia, unspecified: Secondary | ICD-10-CM | POA: Diagnosis present

## 2020-07-24 DIAGNOSIS — K219 Gastro-esophageal reflux disease without esophagitis: Secondary | ICD-10-CM | POA: Diagnosis present

## 2020-07-24 DIAGNOSIS — Z882 Allergy status to sulfonamides status: Secondary | ICD-10-CM

## 2020-07-24 DIAGNOSIS — Z85828 Personal history of other malignant neoplasm of skin: Secondary | ICD-10-CM | POA: Diagnosis not present

## 2020-07-24 DIAGNOSIS — M728 Other fibroblastic disorders: Secondary | ICD-10-CM | POA: Diagnosis not present

## 2020-07-24 DIAGNOSIS — I495 Sick sinus syndrome: Secondary | ICD-10-CM | POA: Diagnosis present

## 2020-07-24 DIAGNOSIS — Z7901 Long term (current) use of anticoagulants: Secondary | ICD-10-CM | POA: Diagnosis not present

## 2020-07-24 DIAGNOSIS — Z87440 Personal history of urinary (tract) infections: Secondary | ICD-10-CM | POA: Diagnosis not present

## 2020-07-24 DIAGNOSIS — G250 Essential tremor: Secondary | ICD-10-CM | POA: Diagnosis present

## 2020-07-24 DIAGNOSIS — H04123 Dry eye syndrome of bilateral lacrimal glands: Secondary | ICD-10-CM | POA: Diagnosis not present

## 2020-07-24 DIAGNOSIS — I4891 Unspecified atrial fibrillation: Secondary | ICD-10-CM | POA: Diagnosis not present

## 2020-07-24 DIAGNOSIS — M2550 Pain in unspecified joint: Secondary | ICD-10-CM | POA: Diagnosis not present

## 2020-07-24 DIAGNOSIS — S20219D Contusion of unspecified front wall of thorax, subsequent encounter: Secondary | ICD-10-CM | POA: Diagnosis not present

## 2020-07-24 DIAGNOSIS — F329 Major depressive disorder, single episode, unspecified: Secondary | ICD-10-CM | POA: Diagnosis not present

## 2020-07-24 HISTORY — PX: IRRIGATION AND DEBRIDEMENT ABSCESS: SHX5252

## 2020-07-24 LAB — COMPREHENSIVE METABOLIC PANEL
ALT: 33 U/L (ref 0–44)
AST: 23 U/L (ref 15–41)
Albumin: 3.5 g/dL (ref 3.5–5.0)
Alkaline Phosphatase: 131 U/L — ABNORMAL HIGH (ref 38–126)
Anion gap: 8 (ref 5–15)
BUN: 27 mg/dL — ABNORMAL HIGH (ref 8–23)
CO2: 26 mmol/L (ref 22–32)
Calcium: 8.7 mg/dL — ABNORMAL LOW (ref 8.9–10.3)
Chloride: 101 mmol/L (ref 98–111)
Creatinine, Ser: 1.61 mg/dL — ABNORMAL HIGH (ref 0.61–1.24)
GFR calc Af Amer: 42 mL/min — ABNORMAL LOW (ref >60)
GFR calc non Af Amer: 37 mL/min — ABNORMAL LOW (ref >60)
Glucose, Bld: 103 mg/dL — ABNORMAL HIGH (ref 70–99)
Potassium: 4.5 mmol/L (ref 3.5–5.1)
Sodium: 135 mmol/L (ref 135–145)
Total Bilirubin: 0.6 mg/dL (ref 0.3–1.2)
Total Protein: 5.9 g/dL — ABNORMAL LOW (ref 6.5–8.1)

## 2020-07-24 LAB — CBC
HCT: 29.6 % — ABNORMAL LOW (ref 39.0–52.0)
Hemoglobin: 9.6 g/dL — ABNORMAL LOW (ref 13.0–17.0)
MCH: 33 pg (ref 26.0–34.0)
MCHC: 32.4 g/dL (ref 30.0–36.0)
MCV: 101.7 fL — ABNORMAL HIGH (ref 80.0–100.0)
Platelets: 89 10*3/uL — ABNORMAL LOW (ref 150–400)
RBC: 2.91 MIL/uL — ABNORMAL LOW (ref 4.22–5.81)
RDW: 15.8 % — ABNORMAL HIGH (ref 11.5–15.5)
WBC: 5.3 10*3/uL (ref 4.0–10.5)
nRBC: 0 % (ref 0.0–0.2)

## 2020-07-24 LAB — PROTIME-INR
INR: 1.6 — ABNORMAL HIGH (ref 0.8–1.2)
Prothrombin Time: 18.8 seconds — ABNORMAL HIGH (ref 11.4–15.2)

## 2020-07-24 LAB — SARS CORONAVIRUS 2 BY RT PCR (HOSPITAL ORDER, PERFORMED IN ~~LOC~~ HOSPITAL LAB): SARS Coronavirus 2: NEGATIVE

## 2020-07-24 LAB — APTT: aPTT: 60 seconds — ABNORMAL HIGH (ref 24–36)

## 2020-07-24 SURGERY — IRRIGATION AND DEBRIDEMENT ABSCESS
Anesthesia: General | Laterality: Right

## 2020-07-24 MED ORDER — PROSIGHT PO TABS
1.0000 | ORAL_TABLET | Freq: Every day | ORAL | Status: DC
  Administered 2020-07-25 – 2020-07-28 (×4): 1 via ORAL
  Filled 2020-07-24 (×4): qty 1

## 2020-07-24 MED ORDER — PREGABALIN 25 MG PO CAPS
25.0000 mg | ORAL_CAPSULE | Freq: Every day | ORAL | Status: DC
  Administered 2020-07-24 – 2020-07-27 (×4): 25 mg via ORAL
  Filled 2020-07-24 (×4): qty 1

## 2020-07-24 MED ORDER — ONDANSETRON HCL 4 MG/2ML IJ SOLN: 4.0000 mg | Freq: Four times a day (QID) | INTRAMUSCULAR | Status: DC | PRN

## 2020-07-24 MED ORDER — LIDOCAINE-EPINEPHRINE 1 %-1:100000 IJ SOLN
INTRAMUSCULAR | Status: AC
  Filled 2020-07-24: qty 1

## 2020-07-24 MED ORDER — SODIUM CHLORIDE 0.9 % IV SOLN: INTRAVENOUS | Status: DC

## 2020-07-24 MED ORDER — CEFAZOLIN SODIUM-DEXTROSE 2-4 GM/100ML-% IV SOLN
INTRAVENOUS | Status: AC
  Filled 2020-07-24: qty 100

## 2020-07-24 MED ORDER — TOPIRAMATE 25 MG PO TABS
50.0000 mg | ORAL_TABLET | Freq: Two times a day (BID) | ORAL | Status: DC
  Administered 2020-07-24 – 2020-07-28 (×8): 50 mg via ORAL
  Filled 2020-07-24 (×10): qty 2

## 2020-07-24 MED ORDER — ACETAMINOPHEN 325 MG PO TABS
650.0000 mg | ORAL_TABLET | Freq: Four times a day (QID) | ORAL | Status: DC | PRN
  Administered 2020-07-25 (×2): 650 mg via ORAL
  Filled 2020-07-24 (×2): qty 2

## 2020-07-24 MED ORDER — ACETAMINOPHEN 650 MG RE SUPP: 650.0000 mg | Freq: Four times a day (QID) | RECTAL | Status: DC | PRN

## 2020-07-24 MED ORDER — ORAL CARE MOUTH RINSE: 15.0000 mL | Freq: Once | OROMUCOSAL | Status: AC

## 2020-07-24 MED ORDER — OXYCODONE HCL 5 MG PO TABS
2.5000 mg | ORAL_TABLET | Freq: Four times a day (QID) | ORAL | Status: DC | PRN
  Administered 2020-07-24 – 2020-07-26 (×6): 5 mg via ORAL
  Filled 2020-07-24 (×7): qty 1

## 2020-07-24 MED ORDER — LEVOTHYROXINE SODIUM 25 MCG PO TABS
25.0000 ug | ORAL_TABLET | Freq: Every day | ORAL | Status: DC
  Administered 2020-07-25 – 2020-07-28 (×4): 25 ug via ORAL
  Filled 2020-07-24 (×4): qty 1

## 2020-07-24 MED ORDER — METOPROLOL TARTRATE 5 MG/5ML IV SOLN: 5.0000 mg | Freq: Four times a day (QID) | INTRAVENOUS | Status: DC | PRN

## 2020-07-24 MED ORDER — FENTANYL CITRATE (PF) 250 MCG/5ML IJ SOLN
INTRAMUSCULAR | Status: AC
  Filled 2020-07-24: qty 5

## 2020-07-24 MED ORDER — ONDANSETRON HCL 4 MG/2ML IJ SOLN: 4.0000 mg | Freq: Once | INTRAMUSCULAR | Status: DC | PRN

## 2020-07-24 MED ORDER — ACETAMINOPHEN 10 MG/ML IV SOLN: 1000.0000 mg | Freq: Once | INTRAVENOUS | Status: DC | PRN

## 2020-07-24 MED ORDER — FENTANYL CITRATE (PF) 100 MCG/2ML IJ SOLN: 25.0000 ug | INTRAMUSCULAR | Status: DC | PRN

## 2020-07-24 MED ORDER — FUROSEMIDE 40 MG PO TABS
60.0000 mg | ORAL_TABLET | Freq: Every day | ORAL | Status: DC
  Administered 2020-07-25 – 2020-07-28 (×4): 60 mg via ORAL
  Filled 2020-07-24 (×4): qty 1

## 2020-07-24 MED ORDER — DULOXETINE HCL 20 MG PO CPEP
20.0000 mg | ORAL_CAPSULE | Freq: Two times a day (BID) | ORAL | Status: DC
  Administered 2020-07-25 – 2020-07-28 (×8): 20 mg via ORAL
  Filled 2020-07-24 (×9): qty 1

## 2020-07-24 MED ORDER — CHLORHEXIDINE GLUCONATE 0.12 % MT SOLN: 15.0000 mL | Freq: Once | OROMUCOSAL | Status: AC

## 2020-07-24 MED ORDER — TRAMADOL HCL 50 MG PO TABS
50.0000 mg | ORAL_TABLET | Freq: Four times a day (QID) | ORAL | Status: DC | PRN
  Administered 2020-07-24 – 2020-07-25 (×2): 50 mg via ORAL
  Filled 2020-07-24 (×2): qty 1

## 2020-07-24 MED ORDER — HEMOSTATIC AGENTS (NO CHARGE) OPTIME
TOPICAL | Status: DC | PRN
  Administered 2020-07-24: 2 via TOPICAL

## 2020-07-24 MED ORDER — CHLORHEXIDINE GLUCONATE 0.12 % MT SOLN
OROMUCOSAL | Status: AC
  Administered 2020-07-24: 15 mL via OROMUCOSAL
  Filled 2020-07-24: qty 15

## 2020-07-24 MED ORDER — TAMSULOSIN HCL 0.4 MG PO CAPS
0.4000 mg | ORAL_CAPSULE | Freq: Every evening | ORAL | Status: DC
  Administered 2020-07-24 – 2020-07-27 (×5): 0.4 mg via ORAL
  Filled 2020-07-24 (×5): qty 1

## 2020-07-24 MED ORDER — CEFAZOLIN SODIUM-DEXTROSE 2-4 GM/100ML-% IV SOLN
2.0000 g | Freq: Once | INTRAVENOUS | Status: AC
  Administered 2020-07-24: 2 g via INTRAVENOUS
  Filled 2020-07-24: qty 100

## 2020-07-24 MED ORDER — PRIMIDONE 250 MG PO TABS
250.0000 mg | ORAL_TABLET | Freq: Two times a day (BID) | ORAL | Status: DC
  Administered 2020-07-25 – 2020-07-28 (×8): 250 mg via ORAL
  Filled 2020-07-24 (×9): qty 1

## 2020-07-24 MED ORDER — DEXMEDETOMIDINE (PRECEDEX) IN NS 20 MCG/5ML (4 MCG/ML) IV SYRINGE
PREFILLED_SYRINGE | INTRAVENOUS | Status: DC | PRN
  Administered 2020-07-24: 8 ug via INTRAVENOUS
  Administered 2020-07-24: 4 ug via INTRAVENOUS

## 2020-07-24 MED ORDER — FENTANYL CITRATE (PF) 100 MCG/2ML IJ SOLN
INTRAMUSCULAR | Status: DC | PRN
  Administered 2020-07-24 (×3): 50 ug via INTRAVENOUS

## 2020-07-24 MED ORDER — SODIUM CHLORIDE (PF) 0.9 % IJ SOLN
INTRAMUSCULAR | Status: DC | PRN
  Administered 2020-07-24: 10 mL via INTRAMUSCULAR

## 2020-07-24 MED ORDER — VITAMIN B-12 1000 MCG PO TABS
1000.0000 ug | ORAL_TABLET | ORAL | Status: DC
  Administered 2020-07-25 – 2020-07-27 (×2): 1000 ug via ORAL
  Filled 2020-07-24 (×2): qty 1

## 2020-07-24 MED ORDER — LOSARTAN POTASSIUM 50 MG PO TABS
50.0000 mg | ORAL_TABLET | Freq: Every day | ORAL | Status: DC
  Administered 2020-07-25 – 2020-07-28 (×3): 50 mg via ORAL
  Filled 2020-07-24 (×4): qty 1

## 2020-07-24 MED ORDER — ONDANSETRON 4 MG PO TBDP: 4.0000 mg | ORAL_TABLET | Freq: Four times a day (QID) | ORAL | Status: DC | PRN

## 2020-07-24 MED ORDER — ACETAMINOPHEN 500 MG PO TABS: 1000.0000 mg | ORAL_TABLET | Freq: Four times a day (QID) | ORAL | Status: DC | PRN

## 2020-07-24 SURGICAL SUPPLY — 23 items
CANISTER SUCT 3000ML PPV (MISCELLANEOUS) ×3 IMPLANT
COVER SURGICAL LIGHT HANDLE (MISCELLANEOUS) ×3 IMPLANT
COVER WAND RF STERILE (DRAPES) ×3 IMPLANT
ELECT REM PT RETURN 9FT ADLT (ELECTROSURGICAL) ×3
ELECTRODE REM PT RTRN 9FT ADLT (ELECTROSURGICAL) ×1 IMPLANT
GAUZE SPONGE 4X4 12PLY STRL (GAUZE/BANDAGES/DRESSINGS) ×3 IMPLANT
GLOVE BIOGEL PI IND STRL 8 (GLOVE) ×1 IMPLANT
GLOVE BIOGEL PI INDICATOR 8 (GLOVE) ×2
GLOVE ECLIPSE 8.0 STRL XLNG CF (GLOVE) ×3 IMPLANT
GOWN STRL REUS W/ TWL LRG LVL3 (GOWN DISPOSABLE) ×2 IMPLANT
GOWN STRL REUS W/TWL LRG LVL3 (GOWN DISPOSABLE) ×6
HEMOSTAT ARISTA ABSORB 3G PWDR (HEMOSTASIS) ×6 IMPLANT
KIT BASIN OR (CUSTOM PROCEDURE TRAY) ×3 IMPLANT
KIT TURNOVER KIT B (KITS) ×3 IMPLANT
NS IRRIG 1000ML POUR BTL (IV SOLUTION) ×3 IMPLANT
PACK GENERAL/GYN (CUSTOM PROCEDURE TRAY) ×3 IMPLANT
PAD ARMBOARD 7.5X6 YLW CONV (MISCELLANEOUS) ×3 IMPLANT
PENCIL SMOKE EVACUATOR (MISCELLANEOUS) ×3 IMPLANT
SUT VIC AB 2-0 CT1 27 (SUTURE) ×6
SUT VIC AB 2-0 CT1 TAPERPNT 27 (SUTURE) ×2 IMPLANT
TAPE CLOTH 4X10 WHT NS (GAUZE/BANDAGES/DRESSINGS) ×3 IMPLANT
TOWEL GREEN STERILE (TOWEL DISPOSABLE) ×3 IMPLANT
TOWEL GREEN STERILE FF (TOWEL DISPOSABLE) ×3 IMPLANT

## 2020-07-24 NOTE — Anesthesia Postprocedure Evaluation (Signed)
Anesthesia Post Note  Patient: Douglas Walker  Procedure(s) Performed: IRRIGATION AND DEBRIDEMENT HEMATOMA (Right )     Patient location during evaluation: PACU Anesthesia Type: MAC Level of consciousness: awake Pain management: pain level controlled Vital Signs Assessment: post-procedure vital signs reviewed and stable Respiratory status: spontaneous breathing, nonlabored ventilation, respiratory function stable and patient connected to nasal cannula oxygen Cardiovascular status: stable and blood pressure returned to baseline Postop Assessment: no apparent nausea or vomiting Anesthetic complications: no   No complications documented.  Last Vitals:  Vitals:   07/24/20 1642 07/24/20 1706  BP: 119/65 (!) 130/61  Pulse: 70 70  Resp: 12 16  Temp: 36.7 C 36.7 C  SpO2: 95% 100%    Last Pain:  Vitals:   07/24/20 1706  TempSrc: Oral  PainSc: 0-No pain                 Auron Tadros P Locke Barrell

## 2020-07-24 NOTE — H&P (Signed)
Douglas Walker is an 86 y.o. male.   Chief Complaint: Right chest wall postoperative hematoma HPI: This is a 86 year old gentleman who had undergone resection of a right lateral chest wall mass by Dr. Barry Dienes on 7/21.  He is currently on Coumadin.  He developed a postoperative hematoma.  He was seen in our office today an attempt was made to aspirate the hematoma without success.  Because it is symptomatic decision was made to admit him to the hospital for return to the operating room.  He reports only pain at the operative site.  He has no chest pain or shortness of breath and is otherwise without complaints.  Past Medical History:  Diagnosis Date  . Arthritis    shoulders and back  . Cancer ([place])    skin cancers  . Chronic low back pain 12/15/2017  . CKD (chronic kidney disease) stage 3, GFR 30-59 ml/min   . Coronary artery disease   . Dysrhythmia     Paroxysmal atrial fibrillation; Tachycardia-bradycardia syndrome  . Essential tremor 02/28/2016  . GERD (gastroesophageal reflux disease)   . Hernia   . History of hiatal hernia   . Hyperlipemia   . Hypertension   . Macular degeneration   . Neuromuscular disorder ([facility])    neuropathy  . Paroxysmal atrial fibrillation ([place]) 10/28/2013  . Presence of permanent cardiac pacemaker 12/04/2018  . Tachycardia-bradycardia syndrome ([place]) 09/13/2014  . TIA (transient ischemic attack)     Past Surgical History:  Procedure Laterality Date  . APPENDECTOMY    . EYE SURGERY    . HERNIA REPAIR    . HERNIA REPAIR    . INSERT / REPLACE / REMOVE PACEMAKER  12/04/2018  . MASS EXCISION Right 07/20/2020   Procedure: EXCISION OF RIGHT CHEST WALL MASS;  Surgeon: Stark Klein, MD;  Location: [facility];  Service: General;  Laterality: Right;  . PACEMAKER IMPLANT N/A 12/04/2018   Procedure: PACEMAKER IMPLANT;  Surgeon: Evans Lance, MD;  Location: [facility];  Service: Cardiovascular;  Laterality: N/A;    Family History  Problem Relation Age of  Onset  . GI problems Mother   . Other Sister        PAIN ISSUES  . Hearing loss Sister   . Blindness Sister   . Heart attack Neg Hx   . Stroke Neg Hx    Social History:  reports that he quit smoking about 57 years ago. He has never used smokeless tobacco. He reports that he does not drink alcohol and does not use drugs.  Allergies:  Allergies  Allergen Reactions  . Sulfa Antibiotics Other (See Comments)    Weakness, dizziness    Medications Prior to Admission  Medication Sig Dispense Refill  . acetaminophen (TYLENOL) 500 MG tablet Take 1,000 mg by mouth every 6 (six) hours as needed for mild pain.    . beta carotene w/minerals (OCUVITE) tablet Take 1 tablet by mouth daily.     . DULoxetine (CYMBALTA) 20 MG capsule Take 20 mg by mouth 2 (two) times daily. 2nd dose at lunch    . enoxaparin (LOVENOX) 120 MG/0.8ML injection Inject 0.8 mLs (120 mg total) into the skin daily. 6.4 mL 1  . furosemide (LASIX) 20 MG tablet Take 60 mg by mouth daily.    Marland Kitchen levothyroxine (SYNTHROID) 25 MCG tablet TAKE 1 TABLET (25 MCG TOTAL) BY MOUTH DAILY BEFORE BREAKFAST. 90 tablet 1  . losartan (COZAAR) 50 MG tablet Take 50 mg by mouth daily.  1  . oxyCODONE (OXY IR/ROXICODONE) 5 MG immediate release tablet Take 0.5-1 tablets (2.5-5 mg total) by mouth every 6 (six) hours as needed for severe pain. 10 tablet 0  . pregabalin (LYRICA) 25 MG capsule TAKE 1 CAPSULE(25 MG) BY MOUTH AT BEDTIME (Patient taking differently: Take 25 mg by mouth at bedtime. ) 90 capsule 1  . primidone (MYSOLINE) 250 MG tablet Take 250 mg by mouth 2 (two) times daily. 2nd dose at lunch  11  . Tamsulosin HCl (FLOMAX) 0.4 MG CAPS Take 0.4 mg by mouth every evening.     . topiramate (TOPAMAX) 50 MG tablet Take 1 tablet (50 mg total) by mouth 2 (two) times daily. 180 tablet 1  . traMADol (ULTRAM) 50 MG tablet Take 50 mg by mouth every 6 (six) hours as needed for moderate pain.     . traMADol (ULTRAM) 50 MG tablet Take 1 tablet (50 mg total)  by mouth every 6 (six) hours as needed for moderate pain or severe pain. 30 tablet 0  . vitamin B-12 (CYANOCOBALAMIN) 1000 MCG tablet Take 1,000 mcg by mouth every other day. Lunch    . warfarin (COUMADIN) 5 MG tablet Take as directed by the coumadin clinic (Patient taking differently: Take 5-7.5 mg by mouth See admin instructions. Take 7.5 mg every TMA:UQJFHL Monday and Friday take 5 mg) 135 tablet 0    No results found for this or any previous visit (from the past 48 hour(s)). No results found.  Review of Systems  All other systems reviewed and are negative.   Blood pressure (!) 123/62, pulse 70, temperature 98.4 F (36.9 C), temperature source Oral, resp. rate 18, SpO2 99 %. Physical Exam Constitutional:      General: He is not in acute distress.    Appearance: He is not ill-appearing.  HENT:     Head: Normocephalic and atraumatic.     Right Ear: External ear normal.     Left Ear: External ear normal.     Nose: Nose normal.     Mouth/Throat:     Mouth: Mucous membranes are moist.     Pharynx: Oropharynx is clear.  Eyes:     Pupils: Pupils are equal, round, and reactive to light.  Cardiovascular:     Rate and Rhythm: Rhythm irregular.     Pulses: Normal pulses.  Pulmonary:     Effort: Pulmonary effort is normal.     Breath sounds: Normal breath sounds.     Comments: There is a large hematoma at the operative site on the right lateral chest.  There is no evidence of infection Abdominal:     Palpations: Abdomen is soft.     Tenderness: There is no abdominal tenderness.  Musculoskeletal:     Cervical back: Normal range of motion.  Skin:    General: Skin is warm and dry.     Findings: Ecchymosis present.  Neurological:     Mental Status: He is alert.      Assessment/Plan Postoperative right chest wall hematoma  I discussed this with the patient and his son.  Plan will be to proceed to the operating room to evacuate the hematoma and control any ongoing bleeding.  I  suspect there is not currently active bleeding going on.  I discussed the risks of further bleeding and infection as well as the need for further procedures.  They understand and agree to proceed with surgery  Coralie Keens, MD 07/24/2020, 1:49 PM

## 2020-07-24 NOTE — Op Note (Signed)
   Douglas Walker 07/24/2020   Pre-op Diagnosis: POSTOP HEMATOMA RIGHT LATERALCHEST WALL     Post-op Diagnosis: SAME  Procedure(s): EVACUATION OF RIGHT CHEST WALL HEMATOMA  Surgeon(s): Coralie Keens, MD  Anesthesia: Choice  Staff:  Circulator: Sadie Haber, RN Scrub Person: Buddy Duty, [place] Circulator Assistant: Ma Rings, RN  Indications: This is a 86 year old gentleman who underwent resection of a right lateral chest wall mass on 7/22 by Dr. Barry Dienes.  He developed a hematoma over the weekend and presented to our office today.  He was admitted to the hospital from the office for evacuation of the hematoma which was symptomatic  Procedure: Patient brought to operating identifies correct patient.  He is placed upon the operating table and turned slightly to the left lateral position.  His previous surgical site hematoma were then prepped and draped in usual sterile fashion.  I anesthetized the area with 1% lidocaine.  I opened up the old incision with a scalpel and evacuated a very large hematoma from the wound.  I then irrigated the wound significantly with normal saline.  I then explored the wound.  There were several areas of mild disease which I cauterized.  There was no visible actively bleeding vessel identified in the wound.  I applied the wound further and then placed Arista powder into the wound.  At this point, hemostasis appeared to be achieved.  I then closed the incision with interrupted 2-0 Vicryl sutures and closed the skin with interrupted 2-0 nylon sutures.  A pressure dressing was then applied.  The patient tolerated the procedure well.  All the counts were correct at the end of the procedure.  The patient was then taken in stable condition from the operating room to the recovery room.                         Coralie Keens   Date: 07/24/2020  Time: 4:10 PM

## 2020-07-24 NOTE — Progress Notes (Signed)
Dr. Roanna Banning notified of pacemaker.

## 2020-07-24 NOTE — Anesthesia Preprocedure Evaluation (Addendum)
Anesthesia Evaluation  Patient identified by MRN, date of birth, ID band Patient awake    Reviewed: Allergy & Precautions, NPO status , Patient's Chart, lab work & pertinent test results  Airway Mallampati: II  TM Distance: >3 FB Neck ROM: Full    Dental  (+) Edentulous Upper, Edentulous Lower   Pulmonary former smoker,    Pulmonary exam normal breath sounds clear to auscultation       Cardiovascular hypertension, Pt. on medications + CAD and +CHF  + dysrhythmias Atrial Fibrillation + pacemaker  Rhythm:Irregular Rate:Abnormal     Neuro/Psych TIA Neuromuscular disease negative psych ROS   GI/Hepatic Neg liver ROS, hiatal hernia,   Endo/Other  negative endocrine ROS  Renal/GU Renal InsufficiencyRenal disease     Musculoskeletal  (+) Arthritis ,   Abdominal   Peds  Hematology  (+) Blood dyscrasia (Warfarin), anemia ,   Anesthesia Other Findings  RIGHT SIDE CHEST HEMATOMA   Reproductive/Obstetrics                            Anesthesia Physical  Anesthesia Plan  ASA: III  Anesthesia Plan: MAC   Post-op Pain Management:    Induction: Intravenous  PONV Risk Score and Plan: 1 and Dexamethasone, Ondansetron, Treatment may vary due to age or medical condition and Propofol infusion  Airway Management Planned: Simple Face Mask  Additional Equipment:   Intra-op Plan:   Post-operative Plan:   Informed Consent: I have reviewed the patients History and Physical, chart, labs and discussed the procedure including the risks, benefits and alternatives for the proposed anesthesia with the patient or authorized representative who has indicated his/her understanding and acceptance.     Dental advisory given  Plan Discussed with: CRNA  Anesthesia Plan Comments: (PAT note by Karoline Caldwell, PA-C: Pt follows with cardiology for history of tachycardia-bradycardia syndrome with recurrent syncope s/p  PPM insertion 12/04/18, atrial fibrillation on Coumadin, chronic anticoagulation therapy, prior history of TIA,coronary artery disease.  Last seen by Dr. Tamala Julian 02/28/2020, reportedly doing well from cardiac standpoint that time.  Cardiac clearance per telephone encounter 06/30/2020.  Patient is on high Lovenox bridge per pharmacy instructions in encounter dated 07/11/2020. Coumadin LD 7/16.  CKD 3, creatinine on preop labs 1.74.   Platelets mildly low at 89K (review of history shows mild thrombocytopenia at baseline), mild anemia with hemoglobin 12.1.  Remainder of labs unremarkable.  EKG 02/28/20: Afib. V-pacing at 70bpm.   Perioperative cardiac device orders 07/13/2020: Device Information:  Clinic EP Physician:  [name], MD  Device Type:  Pacemaker Manufacturer and Phone #:  [employer]: [phone] Pacemaker Dependent?:  Yes.   Date of Last Device Check:  07/07/2020 (remote transmission). Overdue for in-office follow-up--please contact industry representative. Normal Device Function?:  Yes.    Electrophysiologist's Recommendations:  Have magnet available. Provide continuous ECG monitoring when magnet is used or reprogramming is to be performed.  Overdue for in-office follow-up--please contact industry representative. Procedure will likely interfere with device function.  Device should be programmed:  Asynchronous pacing during procedure and returned to normal programming after procedure.  CT chest 05/17/2020: IMPRESSION: 1. There is a mass either arising from or invading the musculature immediately superficial to the ribs on the right at the T9-T10 level. This mass is solid and mildly inhomogeneous in appearance on noncontrast enhanced CT examination. This lesion measures 5.5 x 2.9 x 2.0 cm. While an inflammatory lesion could present in this manner, either a primary sarcoma  or metastatic lesion could present in this manner. This lesion may well warrant tissue sampling for  further assessment given concern for potential neoplasm. No similar lesions elsewhere.  2. Scattered granulomas. No lung edema or consolidation. No parenchymal lung nodule beyond small granulomas.  3.  No adenopathy.  4. Ascending thoracic aortic diameter of 4.7 x 4.7 cm. Ascending thoracic aortic aneurysm. Recommend semi-annual imaging followup by CTA or MRA and referral to cardiothoracic surgery if not already obtained. This recommendation follows 2010 ACCF/AHA/AATS/ACR/ASA/SCA/SCAI/SIR/STS/SVM Guidelines for the Diagnosis and Management of Patients With Thoracic Aortic Disease. Circulation. 2010; 121: L893-T342. Aortic aneurysm NOS (ICD10-I71.9). There is aortic atherosclerosis as well as foci of coronary artery calcification.  5.  Pacemaker leads attached to right atrium and right ventricle.  6. Question a degree of underlying hepatic cirrhosis. Liver has a subtly nodular contour. Appropriate laboratory correlation in this regard advised.  TTE 05/19/2015: - Left ventricle: The cavity size was normal. Systolic function was  normal. The estimated ejection fraction was in the range of 55%  to 60%. Wall motion was normal; there were no regional wall  motion abnormalities.  - Aortic valve: There was mild regurgitation.  - Left atrium: The atrium was mildly dilated.  - Atrial septum: No defect or patent foramen ovale was identified.  )       Anesthesia Quick Evaluation

## 2020-07-24 NOTE — Transfer of Care (Signed)
Immediate Anesthesia Transfer of Care Note  Patient: Douglas Walker  Procedure(s) Performed: IRRIGATION AND DEBRIDEMENT HEMATOMA (Right )  Patient Location: PACU  Anesthesia Type:MAC  Level of Consciousness: awake, alert  and oriented  Airway & Oxygen Therapy: Patient Spontanous Breathing  Post-op Assessment: Report given to RN and Post -op Vital signs reviewed and stable  Post vital signs: Reviewed and stable  Last Vitals:  Vitals Value Taken Time  BP 114/61 07/24/20 1612  Temp    Pulse 70 07/24/20 1615  Resp 15 07/24/20 1615  SpO2 100 % 07/24/20 1615  Vitals shown include unvalidated device data.  Last Pain:  Vitals:   07/24/20 1401  TempSrc:   PainSc: 0-No pain      Patients Stated Pain Goal: 4 (29/51/88 4166)  Complications: No complications documented.

## 2020-07-25 ENCOUNTER — Encounter (HOSPITAL_COMMUNITY): Payer: Self-pay | Admitting: Surgery

## 2020-07-25 MED ORDER — LIDOCAINE 5 % EX PTCH
1.0000 | MEDICATED_PATCH | CUTANEOUS | Status: DC
  Administered 2020-07-25 – 2020-07-28 (×4): 1 via TRANSDERMAL
  Filled 2020-07-25 (×4): qty 1

## 2020-07-25 NOTE — Progress Notes (Signed)
[facility] Progress Note  1 Day Post-Op  Subjective: Patient reports pain in R back. RN reports area is swollen and appears like hematoma has re-accumulated. Had some lower BP overnight, improved this AM.   Objective: Vital signs in last 24 hours: Temp:  [97.8 F (36.6 C)-98.4 F (36.9 C)] 98 F (36.7 C) (07/27 0430) Pulse Rate:  [69-70] 70 (07/27 0430) Resp:  [12-18] 18 (07/27 0430) BP: (87-130)/(47-65) 107/47 (07/27 0900) SpO2:  [94 %-100 %] 94 % (07/27 0430) Weight:  [89.8 kg-91.9 kg] 89.8 kg (07/26 1706) Last BM Date: 07/22/20  Intake/Output from previous day: 07/26 0701 - 07/27 0700 In: 1250 [P.O.:750; I.V.:400; IV Piggyback:100] Out: 9470 [Urine:1175; Blood:20] Intake/Output this shift: Total I/O In: -  Out: 200 [Urine:200]  PE: General: pleasant, WD, chronically ill appearing elderly male who is laying in bed in NAD Heart: regular, rate, and rhythm. Palpable radial and pedal pulses bilaterally Lungs: CTAB, no wheezes, rhonchi, or rales noted.  Respiratory effort nonlabored Abd: soft, NT, ND, +BS, no masses, hernias, or organomegaly Back: R flank/back hematoma with ecchymosis, incision c/d/i with sutures in place, attempted to aspirate and could not get anything     Lab Results:  Recent Labs    07/24/20 1335  WBC 5.3  HGB 9.6*  HCT 29.6*  PLT 89*   BMET Recent Labs    07/24/20 1335  NA 135  K 4.5  CL 101  CO2 26  GLUCOSE 103*  BUN 27*  CREATININE 1.61*  CALCIUM 8.7*   PT/INR Recent Labs    07/24/20 1335  LABPROT 18.8*  INR 1.6*   CMP     Component Value Date/Time   NA 135 07/24/2020 1335   NA 140 01/28/2019 1112   K 4.5 07/24/2020 1335   CL 101 07/24/2020 1335   CO2 26 07/24/2020 1335   GLUCOSE 103 (H) 07/24/2020 1335   BUN 27 (H) 07/24/2020 1335   BUN 35 01/28/2019 1112   CREATININE 1.61 (H) 07/24/2020 1335   CALCIUM 8.7 (L) 07/24/2020 1335   PROT 5.9 (L) 07/24/2020 1335   ALBUMIN 3.5 07/24/2020 1335   AST 23  07/24/2020 1335   ALT 33 07/24/2020 1335   ALKPHOS 131 (H) 07/24/2020 1335   BILITOT 0.6 07/24/2020 1335   GFRNONAA 37 (L) 07/24/2020 1335   GFRAA 42 (L) 07/24/2020 1335   Lipase     Component Value Date/Time   LIPASE 48 05/19/2015 0630       Studies/Results: No results found.  Anti-infectives: Anti-infectives (From admission, onward)   Start     Dose/Rate Route Frequency Ordered Stop   07/24/20 1345  ceFAZolin (ANCEF) IVPB 2g/100 mL premix        2 g 200 mL/hr over 30 Minutes Intravenous  Once 07/24/20 1335 07/24/20 1534   07/24/20 1339  ceFAZolin (ANCEF) 2-4 GM/100ML-% IVPB       Note to Pharmacy: Henrine Screws   : cabinet override      07/24/20 1339 07/24/20 1536       Assessment/Plan Post-operative R chest wall hematoma S/p evacuation and closure 7/26 Dr. Ninfa Linden - POD#5/POD#1 - hematoma appears to have re-accumulated more posteriorly - apply ice and binder - trend CBC - h/h 9.6/29.6 this AM - attempted to aspirate but could not decompress - would not recommend taking back to OR at this time - add lidocaine patch for pain control   FEN: reg diet, gentle IVF VTE: SCDs ID: Ancef 7/26 Follow up: Dr. Barry Dienes in  2 weeks   LOS: 1 day    Norm Parcel , [facility] 07/25/2020, 10:11 AM Please see Amion for pager number during day hours 7:00am-4:30pm

## 2020-07-25 NOTE — Plan of Care (Signed)
  Problem: Education: Goal: Knowledge of General Education information will improve Description: Including pain rating scale, medication(s)/side effects and non-pharmacologic comfort measures Outcome: Progressing   Problem: Clinical Measurements: Goal: Ability to maintain clinical measurements within normal limits will improve Outcome: Progressing Goal: Will remain free from infection Outcome: Progressing Goal: Cardiovascular complication will be avoided Outcome: Progressing   Problem: Activity: Goal: Risk for activity intolerance will decrease Outcome: Progressing   Problem: Nutrition: Goal: Adequate nutrition will be maintained Outcome: Progressing   Problem: Coping: Goal: Level of anxiety will decrease Outcome: Progressing   Problem: Elimination: Goal: Will not experience complications related to bowel motility Outcome: Progressing Goal: Will not experience complications related to urinary retention Outcome: Progressing   Problem: Pain Managment: Goal: General experience of comfort will improve Outcome: Progressing

## 2020-07-26 LAB — CBC
HCT: 23.7 % — ABNORMAL LOW (ref 39.0–52.0)
HCT: 26.5 % — ABNORMAL LOW (ref 39.0–52.0)
Hemoglobin: 8 g/dL — ABNORMAL LOW (ref 13.0–17.0)
Hemoglobin: 8.9 g/dL — ABNORMAL LOW (ref 13.0–17.0)
MCH: 33.6 pg (ref 26.0–34.0)
MCH: 32.7 pg (ref 26.0–34.0)
MCHC: 33.8 g/dL (ref 30.0–36.0)
MCHC: 33.6 g/dL (ref 30.0–36.0)
MCV: 99.6 fL (ref 80.0–100.0)
MCV: 97.4 fL (ref 80.0–100.0)
Platelets: 91 10*3/uL — ABNORMAL LOW (ref 150–400)
Platelets: 93 10*3/uL — ABNORMAL LOW (ref 150–400)
RBC: 2.38 MIL/uL — ABNORMAL LOW (ref 4.22–5.81)
RBC: 2.72 MIL/uL — ABNORMAL LOW (ref 4.22–5.81)
RDW: 15.8 % — ABNORMAL HIGH (ref 11.5–15.5)
RDW: 17 % — ABNORMAL HIGH (ref 11.5–15.5)
WBC: 7.9 10*3/uL (ref 4.0–10.5)
WBC: 8.4 10*3/uL (ref 4.0–10.5)
nRBC: 0 % (ref 0.0–0.2)
nRBC: 0 % (ref 0.0–0.2)

## 2020-07-26 LAB — BASIC METABOLIC PANEL
Anion gap: 8 (ref 5–15)
BUN: 27 mg/dL — ABNORMAL HIGH (ref 8–23)
CO2: 24 mmol/L (ref 22–32)
Calcium: 8.3 mg/dL — ABNORMAL LOW (ref 8.9–10.3)
Chloride: 106 mmol/L (ref 98–111)
Creatinine, Ser: 1.68 mg/dL — ABNORMAL HIGH (ref 0.61–1.24)
GFR calc Af Amer: 40 mL/min — ABNORMAL LOW (ref >60)
GFR calc non Af Amer: 35 mL/min — ABNORMAL LOW (ref >60)
Glucose, Bld: 114 mg/dL — ABNORMAL HIGH (ref 70–99)
Potassium: 4.3 mmol/L (ref 3.5–5.1)
Sodium: 138 mmol/L (ref 135–145)

## 2020-07-26 LAB — ABO/RH: ABO/RH(D): A POS

## 2020-07-26 LAB — PREPARE RBC (CROSSMATCH)

## 2020-07-26 MED ORDER — METHOCARBAMOL 500 MG PO TABS
500.0000 mg | ORAL_TABLET | Freq: Three times a day (TID) | ORAL | Status: DC
  Administered 2020-07-26 – 2020-07-28 (×7): 500 mg via ORAL
  Filled 2020-07-26 (×7): qty 1

## 2020-07-26 MED ORDER — ACETAMINOPHEN 325 MG PO TABS
650.0000 mg | ORAL_TABLET | Freq: Four times a day (QID) | ORAL | Status: DC
  Administered 2020-07-26 – 2020-07-28 (×10): 650 mg via ORAL
  Filled 2020-07-26 (×10): qty 2

## 2020-07-26 MED ORDER — SODIUM CHLORIDE 0.9% IV SOLUTION: Freq: Once | INTRAVENOUS | Status: AC

## 2020-07-26 NOTE — Evaluation (Signed)
Physical Therapy Evaluation Patient Details Name: Douglas Walker MRN: [id number] DOB: [birth year] Today's Date: 07/26/2020   History of Present Illness  Pt is a 86 y.o. M with significant PMH of skin CA, chronic low back pain, CKD, atrial fibrillation, pacemaker, TIA, who presents s/p resection of a right lateral chest wall mass on 7/21 with a postoperative hematoma. Now s/p evacuation of right chest wall hematoma.   Clinical Impression  Prior to admission, pt stays with his son during the day and his home at night, uses a walker for ambulation and is independent with ADL's. Pt presents with decreased functional mobility secondary to generalized weakness, poor balance, activity tolerance, and pain. Requiring min assist for transfers, ambulating a couple steps forward with significant time/effort before requiring a seat back onto bed due to knee instability and fatigue. Discussed with pt son via phone. If pt plans to discharge to his home, will likely need PTAR due to having 4 steps to enter (CSW notified).     Follow Up Recommendations SNF;Supervision/Assistance - 24 hour (pt son preferring pt go home; will need HHPT)    Equipment Recommendations  3in1 (PT)    Recommendations for Other Services       Precautions / Restrictions Precautions Precautions: Fall;Other (comment) Precaution Comments: binder Restrictions Weight Bearing Restrictions: No      Mobility  Bed Mobility Overal bed mobility: Needs Assistance Bed Mobility: Rolling;Sidelying to Sit;Sit to Sidelying Rolling: Modified independent (Device/Increase time) Sidelying to sit: Min guard     Sit to sidelying: Mod assist General bed mobility comments: No physical assist to progress to edge of bed, increased time and effort. ModA for BLE negotiation back into bed  Transfers Overall transfer level: Needs assistance Equipment used: Rolling walker (2 wheeled) Transfers: Sit to/from Stand Sit to Stand: Min assist          General transfer comment: MinA to rise to stand from elevated bed height  Ambulation/Gait Ambulation/Gait assistance: Min assist Gait Distance (Feet): 3 Feet Assistive device: Rolling walker (2 wheeled) Gait Pattern/deviations: Step-to pattern;Decreased stride length;Trunk flexed Gait velocity: decreased Gait velocity interpretation: <1.31 ft/sec, indicative of household ambulator General Gait Details: Pt taking several small steps forward, backwards, and sideways. Further ambulation distance limited due to knee instability and fatigue. Increased time required for stepping initiation.  Stairs            Wheelchair Mobility    Modified Rankin (Stroke Patients Only)       Balance Overall balance assessment: Needs assistance Sitting-balance support: Feet supported Sitting balance-Leahy Scale: Fair     Standing balance support: Bilateral upper extremity supported Standing balance-Leahy Scale: Poor Standing balance comment: Reliant on BUE support                             Pertinent Vitals/Pain Pain Assessment: Faces Faces Pain Scale: Hurts even more Pain Location: back Pain Descriptors / Indicators: Grimacing;Guarding Pain Intervention(s): Monitored during session;Limited activity within patient's tolerance    Home Living Family/patient expects to be discharged to:: Private residence Living Arrangements: Alone Available Help at Discharge: Family;Available PRN/intermittently Type of Home: House Home Access: Stairs to enter   [employer] of Steps: 4 (pt son has a "half a step," to get into his house) Home Layout: Able to live on main level with bedroom/bathroom Home Equipment: Other (comment);Walker - 2 wheels;Wheelchair - manual (walking stick) Additional Comments: Pt spends the day at his son's house until 6  PM, sleeps alone at night at his house    Prior Function Level of Independence: Needs assistance   Gait / Transfers Assistance  Needed: Uses walker  ADL's / Homemaking Assistance Needed: Pt son assists with IADL's i.e. cooking        Hand Dominance        Extremity/Trunk Assessment   Upper Extremity Assessment Upper Extremity Assessment: Generalized weakness    Lower Extremity Assessment Lower Extremity Assessment: Generalized weakness    Cervical / Trunk Assessment Cervical / Trunk Assessment: Kyphotic  Communication   Communication: HOH  Cognition Arousal/Alertness: Awake/alert Behavior During Therapy: Flat affect Overall Cognitive Status: Difficult to assess                                 General Comments: Pt significantly HOH. Answers questions appropriately, although does seem to have decreased awareness of safety/deficits      General Comments      Exercises     Assessment/Plan    PT Assessment Patient needs continued PT services  PT Problem List Decreased strength;Decreased activity tolerance;Decreased balance;Decreased mobility;Decreased cognition;Decreased safety awareness;Pain       PT Treatment Interventions DME instruction;Gait training;Stair training;Functional mobility training;Therapeutic activities;Therapeutic exercise;Balance training;Patient/family education    PT Goals (Current goals can be found in the Care Plan section)  Acute Rehab PT Goals Patient Stated Goal: pt son would like patient to go home PT Goal Formulation: With patient Time For Goal Achievement: 08/09/20 Potential to Achieve Goals: Fair    Frequency Min 3X/week   Barriers to discharge        Co-evaluation               AM-PAC PT "6 Clicks" Mobility  Outcome Measure Help needed turning from your back to your side while in a flat bed without using bedrails?: None Help needed moving from lying on your back to sitting on the side of a flat bed without using bedrails?: A Little Help needed moving to and from a bed to a chair (including a wheelchair)?: A Little Help needed  standing up from a chair using your arms (e.g., wheelchair or bedside chair)?: A Little Help needed to walk in hospital room?: A Little Help needed climbing 3-5 steps with a railing? : Total 6 Click Score: 17    End of Session Equipment Utilized During Treatment: Gait belt Activity Tolerance: Patient limited by fatigue Patient left: in bed;with call bell/phone within reach;with bed alarm set Nurse Communication: Mobility status PT Visit Diagnosis: Unsteadiness on feet (R26.81);Muscle weakness (generalized) (M62.81);Difficulty in walking, not elsewhere classified (R26.2);Pain Pain - part of body:  (back)    Time: 7412-8786 PT Time Calculation (min) (ACUTE ONLY): 31 min   Charges:   PT Evaluation $PT Eval Moderate Complexity: 1 Mod PT Treatments $Therapeutic Activity: 8-22 mins          Wyona Almas, PT, DPT Acute Rehabilitation Services Pager [phone] Office [phone]   Deno Etienne 07/26/2020, 3:37 PM

## 2020-07-26 NOTE — Progress Notes (Signed)
[facility] Progress Note  2 Days Post-Op  Subjective: Patient reports pain in R side. He has binder on and has been using some ice. He does not report dizziness or lightheadedness  Objective: Vital signs in last 24 hours: Temp:  [98.1 F (36.7 C)-101.1 F (38.4 C)] 98.1 F (36.7 C) (07/28 0536) Pulse Rate:  [69] 69 (07/28 0536) Resp:  [15-17] 16 (07/28 0536) BP: (91-111)/(45-52) 100/52 (07/28 0536) SpO2:  [95 %-97 %] 95 % (07/28 0536) Last BM Date: 07/22/20  Intake/Output from previous day: 07/27 0701 - 07/28 0700 In: 320 [P.O.:320] Out: 700 [Urine:700] Intake/Output this shift: No intake/output data recorded.  PE: General: pleasant, WD, chronically ill appearing elderly male who is laying in bed in NAD Heart: regular, rate, and rhythm. Palpable radial and pedal pulses bilaterally Lungs: CTAB, no wheezes, rhonchi, or rales noted.  Respiratory effort nonlabored Abd: soft, NT, ND, +BS, no masses, hernias, or organomegaly Back: R flank/back hematoma with ecchymosis, incision c/d/i with sutures in place, attempted to aspirate and could not get anything  Lab Results:  Recent Labs    07/24/20 1335 07/26/20 0501  WBC 5.3 7.9  HGB 9.6* 8.0*  HCT 29.6* 23.7*  PLT 89* 91*   BMET Recent Labs    07/24/20 1335 07/26/20 0501  NA 135 138  K 4.5 4.3  CL 101 106  CO2 26 24  GLUCOSE 103* 114*  BUN 27* 27*  CREATININE 1.61* 1.68*  CALCIUM 8.7* 8.3*   PT/INR Recent Labs    07/24/20 1335  LABPROT 18.8*  INR 1.6*   CMP     Component Value Date/Time   NA 138 07/26/2020 0501   NA 140 01/28/2019 1112   K 4.3 07/26/2020 0501   CL 106 07/26/2020 0501   CO2 24 07/26/2020 0501   GLUCOSE 114 (H) 07/26/2020 0501   BUN 27 (H) 07/26/2020 0501   BUN 35 01/28/2019 1112   CREATININE 1.68 (H) 07/26/2020 0501   CALCIUM 8.3 (L) 07/26/2020 0501   PROT 5.9 (L) 07/24/2020 1335   ALBUMIN 3.5 07/24/2020 1335   AST 23 07/24/2020 1335   ALT 33 07/24/2020 1335    ALKPHOS 131 (H) 07/24/2020 1335   BILITOT 0.6 07/24/2020 1335   GFRNONAA 35 (L) 07/26/2020 0501   GFRAA 40 (L) 07/26/2020 0501   Lipase     Component Value Date/Time   LIPASE 48 05/19/2015 0630       Studies/Results: No results found.  Anti-infectives: Anti-infectives (From admission, onward)   Start     Dose/Rate Route Frequency Ordered Stop   07/24/20 1345  ceFAZolin (ANCEF) IVPB 2g/100 mL premix        2 g 200 mL/hr over 30 Minutes Intravenous  Once 07/24/20 1335 07/24/20 1534   07/24/20 1339  ceFAZolin (ANCEF) 2-4 GM/100ML-% IVPB       Note to Pharmacy: Henrine Screws   : cabinet override      07/24/20 1339 07/24/20 1536       Assessment/Plan Post-operative R chest wall hematoma S/p evacuation and closure 7/26 Dr. Ninfa Linden - POD#6/POD#2 - hematoma appears to have re-accumulated more posteriorly - continue ice and binder - trend CBC - h/h 9.6/29.6 yesterday, 8.0/23.7 this AM - recheck CBC this afternoon  - attempted to aspirate 7/27 but could not decompress - would not recommend taking back to OR at this time - PT evaluation   FEN: reg diet, SLIV VTE: SCDs ID: Ancef 7/26 Follow up: Dr. Barry Dienes in 2 weeks  LOS: 2 days    Norm Parcel , [facility] 07/26/2020, 8:07 AM Please see Amion for pager number during day hours 7:00am-4:30pm

## 2020-07-26 NOTE — TOC Initial Note (Signed)
Transition of Care [facility]) - Initial/Assessment Note    Patient Details  Name: Douglas Walker MRN: [id number] Date of Birth: 1925/09/01  [facility]) CM/SW Contact:    Alexander Mt, LCSW Phone Number: 07/26/2020, 10:07 AM  Clinical Narrative:                 CSW spoke with pt at bedside, pt is a bit hard of hearing but answered questions appopriately. Introduced self, role, reason for visit. Pt from home; confirmed address and PCP. He states that he stays at his son's home during the day and his home at night. He states that he doesn't have any home services [facility] etc) that he is aware of that comes in the home. He usually uses a 4 wheel walker to get around.   TOC team following for therapy assessment later today, anticipate pt will need some post-discharge therapy services.   Expected Discharge Plan: [place] (vs SNF) Barriers to Discharge: Continued Medical Work up   Patient Goals and CMS Choice Patient states their goals for this hospitalization and ongoing recovery are:: feel better   Choice offered to / list presented to : Patient, Adult Children  Expected Discharge Plan and Services Expected Discharge Plan: [place] (vs SNF) In-house Referral: Clinical Social Work Discharge Planning Services: CM Consult Living arrangements for the past 2 months: [place]       Prior Living Arrangements/Services Living arrangements for the past 2 months: Single Family Home Lives with:: Self Patient language and need for interpreter reviewed:: Yes (no needs)        Need for Family Participation in Patient Care: Yes (Comment) (assistance with daily cares as needed) Care giver support system in place?: Yes (comment) (pt son) Current home services: DME Criminal Activity/Legal Involvement Pertinent to Current Situation/Hospitalization: No - Comment as needed  Activities of Daily Living Home Assistive Devices/Equipment:  Eyeglasses, Hearing aid, Wheelchair ADL Screening (condition at time of admission) Patient's cognitive ability adequate to safely complete daily activities?: Yes Is the patient deaf or have difficulty hearing?: Yes Does the patient have difficulty seeing, even when wearing glasses/contacts?: No Does the patient have difficulty concentrating, remembering, or making decisions?: No Patient able to express need for assistance with ADLs?: Yes Does the patient have difficulty dressing or bathing?: Yes Independently performs ADLs?: Yes (appropriate for developmental age) Does the patient have difficulty walking or climbing stairs?: Yes Weakness of Legs: None Weakness of Arms/Hands: None  Permission Sought/Granted Permission sought to share information with : Family Supports Permission granted to share information with : Yes, Verbal Permission Granted  Share Information with NAME: Breanna Mcdaniel     Permission granted to share info w Relationship: son  Permission granted to share info w Contact Information: [phone]  Emotional Assessment Appearance:: Appears stated age Attitude/Demeanor/Rapport: Lethargic Affect (typically observed): Quiet Orientation: : Oriented to Self, Oriented to  Time, Oriented to Place, Oriented to Situation, Fluctuating Orientation (Suspected and/or reported Sundowners) Alcohol / Substance Use: Not Applicable Psych Involvement: No (comment)  Admission diagnosis:  Hematoma, chest wall [S20.219A] Patient Active Problem List   Diagnosis Date Noted   Hematoma, chest wall 07/24/2020   Exudative age-related macular degeneration of right eye with inactive choroidal neovascularization ([place]) 05/10/2020   Exudative age-related macular degeneration of left eye with inactive choroidal neovascularization ([place]) 05/10/2020   Retinal hemorrhage of right eye 05/10/2020   Exposure keratopathy, bilateral 05/10/2020   Bilateral dry eyes 05/10/2020  Advanced nonexudative  age-related macular degeneration of both eyes with subfoveal involvement 05/10/2020   Sick sinus syndrome ([place]) 12/06/2018   Complete heart block (HCC) 12/04/2018   Chronic low back pain 12/15/2017   Hereditary and idiopathic peripheral neuropathy 07/01/2016   Essential tremor 02/28/2016   BPPV (benign paroxysmal positional vertigo) 06/19/2015   Dizziness 06/18/2015   Bacteria in urine 06/18/2015   CKD (chronic kidney disease) stage 3, GFR 30-59 ml/min: Probable 06/18/2015   Chronic diastolic CHF (congestive heart failure) ([place]) 05/30/2015   Fall    Syncope 05/19/2015   UTI (lower urinary tract infection) 05/19/2015   Hypotension 05/19/2015   Dehydration 05/19/2015   Fracture of rib of left side 05/19/2015   Syncope and collapse 05/19/2015   Mobitz type II atrioventricular block 09/13/2014   Essential hypertension 09/13/2014   Chronic anticoagulation 09/13/2014   Tachycardia-bradycardia syndrome ([place]) 09/13/2014   Encounter for therapeutic drug monitoring 02/09/2014   Paroxysmal atrial fibrillation ([place]) 10/28/2013   Thrombocytopenia ([place]) 04/06/2013   PCP:  Josetta Huddle, MD Pharmacy:   [facility] [place], [place] - [address] [place] [address] Phone: [phone] Fax: [phone]  Readmission Risk Interventions Readmission Risk Prevention Plan 07/26/2020  Transportation Screening Complete  PCP or Specialist Appt within 5-7 Days Not Complete  Not Complete comments pending disposition  Home Care Screening Complete  Medication Review (RN CM) Referral to Pharmacy  Some recent data might be hidden

## 2020-07-26 NOTE — Discharge Summary (Signed)
[facility] Discharge Summary   Patient ID: Douglas Walker MRN: [id number] DOB/AGE: January 31, 1925 86 y.o.  Admit date: 07/24/2020 Discharge date: 07/28/2020   Discharge Diagnosis Patient Active Problem List   Diagnosis Date Noted  . Hematoma, chest wall 07/24/2020  . Exudative age-related macular degeneration of right eye with inactive choroidal neovascularization ([place]) 05/10/2020  . Exudative age-related macular degeneration of left eye with inactive choroidal neovascularization ([place]) 05/10/2020  . Retinal hemorrhage of right eye 05/10/2020  . Exposure keratopathy, bilateral 05/10/2020  . Bilateral dry eyes 05/10/2020  . Advanced nonexudative age-related macular degeneration of both eyes with subfoveal involvement 05/10/2020  . Sick sinus syndrome ([place]) 12/06/2018  . Complete heart block ([place]) 12/04/2018  . Chronic low back pain 12/15/2017  . Hereditary and idiopathic peripheral neuropathy 07/01/2016  . Essential tremor 02/28/2016  . BPPV (benign paroxysmal positional vertigo) 06/19/2015  . Dizziness 06/18/2015  . Bacteria in urine 06/18/2015  . CKD (chronic kidney disease) stage 3, GFR 30-59 ml/min: Probable 06/18/2015  . Chronic diastolic CHF (congestive heart failure) ([place]) 05/30/2015  . Fall   . Syncope 05/19/2015  . UTI (lower urinary tract infection) 05/19/2015  . Hypotension 05/19/2015  . Dehydration 05/19/2015  . Fracture of rib of left side 05/19/2015  . Syncope and collapse 05/19/2015  . Mobitz type II atrioventricular block 09/13/2014  . Essential hypertension 09/13/2014  . Chronic anticoagulation 09/13/2014  . Tachycardia-bradycardia syndrome ([place]) 09/13/2014  . Encounter for therapeutic drug monitoring 02/09/2014  . Paroxysmal atrial fibrillation ([place]) 10/28/2013  . Thrombocytopenia ([place]) 04/06/2013   Consultants None  Imaging: No results found.  Procedures 07/24/20 Dr. Ninfa Linden - evacuation of right chest wall hematoma   HPI:  This is a  86 year old gentleman who had undergone resection of a right lateral chest wall mass by Dr. Barry Dienes on 7/22.  He is currently on Coumadin.  He developed a postoperative hematoma.  He was seen in our office today an attempt was made to aspirate the hematoma without success.  Because it is symptomatic decision was made to admit him to the hspital for possible need to return to the operating room.  He reports only pain at the operative site.  He has no chest pain or shortness of breath and is otherwise without coomplaints.  Hospital Course:   Patient was admitted and underwent procedure listed above.  Tolerated procedure well and was transferred to the floor.  Diet was advanced as tolerated. Post-operatively the patient did have some swelling and some recurrent hematoma. Attempt to aspirate the hematoma at bedside was unsuccessful.  The patient required 1 u pRBC post-operatively and then he remained hemodynamically stable.  On POD#8, the patient was voiding well, tolerating diet, working with therapies, pain well controlled, vital signs stable, incisions c/d/i and felt stable for discharge to SNF.  Patient will follow up in our office as below and knows to call with questions or concerns.  Patient is to continue holding anti-coagulation medication and follow up with PCP to discuss risk vs benefit of restarting. I spoke with patients son prior to patients discharge.   Physical Exam: Please see progress note from earlier today   Allergies as of 07/28/2020      Reactions   Sulfa Antibiotics Other (See Comments)   Weakness, dizziness      Medication List    STOP taking these medications   enoxaparin 120 MG/0.8ML injection Commonly known as: LOVENOX   warfarin 5 MG tablet Commonly known as: COUMADIN  TAKE these medications   acetaminophen 500 MG tablet Commonly known as: TYLENOL Take 1,000 mg by mouth every 6 (six) hours as needed for mild pain.   beta carotene w/minerals tablet Take 1 tablet  by mouth daily.   DULoxetine 20 MG capsule Commonly known as: CYMBALTA Take 20 mg by mouth 2 (two) times daily. 2nd dose at lunch   furosemide 20 MG tablet Commonly known as: LASIX Take 60 mg by mouth daily.   levothyroxine 25 MCG tablet Commonly known as: SYNTHROID TAKE 1 TABLET (25 MCG TOTAL) BY MOUTH DAILY BEFORE BREAKFAST.   losartan 50 MG tablet Commonly known as: COZAAR Take 50 mg by mouth daily.   oxyCODONE 5 MG immediate release tablet Commonly known as: Oxy IR/ROXICODONE Take 0.5-1 tablets (2.5-5 mg total) by mouth every 6 (six) hours as needed for severe pain.   pregabalin 25 MG capsule Commonly known as: LYRICA TAKE 1 CAPSULE(25 MG) BY MOUTH AT BEDTIME What changed:   how much to take  how to take this  when to take this  additional instructions   primidone 250 MG tablet Commonly known as: MYSOLINE Take 250 mg by mouth 2 (two) times daily. 2nd dose at lunch   tamsulosin 0.4 MG Caps capsule Commonly known as: FLOMAX Take 0.4 mg by mouth every evening.   topiramate 50 MG tablet Commonly known as: TOPAMAX Take 1 tablet (50 mg total) by mouth 2 (two) times daily.   traMADol 50 MG tablet Commonly known as: ULTRAM Take 1 tablet (50 mg total) by mouth every 6 (six) hours as needed for severe pain. What changed: reasons to take this   vitamin B-12 1000 MCG tablet Commonly known as: CYANOCOBALAMIN Take 1,000 mcg by mouth every other day. Lunch            Durable Medical Equipment  (From admission, onward)         Start     Ordered   07/26/20 1452  For home use only DME 3 n 1  Once        07/26/20 1452            Follow-up Information    Stark Klein, MD. Go on 08/08/2020.   Specialty: General Surgery Why: Follow up scheduled for 3:15 PM. Please arrive 15 min prior to appointment time. Bring photo ID and insurance information.  Contact information: [address] [place] [address] [phone]        Josetta Huddle,  MD. Call.   Specialty: Internal Medicine Why: Call and schedule a follow up appointment in 1-2 weeks. Recommend discussing risk vs benefit of continuing on anticoagulation at this time.  Contact information: [address] [place] [address] [phone]               Signed: Alferd Apa, [facility] 07/28/2020, 12:21 PM

## 2020-07-27 LAB — CBC
HCT: 25 % — ABNORMAL LOW (ref 39.0–52.0)
HCT: 27.5 % — ABNORMAL LOW (ref 39.0–52.0)
Hemoglobin: 8.2 g/dL — ABNORMAL LOW (ref 13.0–17.0)
Hemoglobin: 9.2 g/dL — ABNORMAL LOW (ref 13.0–17.0)
MCH: 31.9 pg (ref 26.0–34.0)
MCH: 32.7 pg (ref 26.0–34.0)
MCHC: 32.8 g/dL (ref 30.0–36.0)
MCHC: 33.5 g/dL (ref 30.0–36.0)
MCV: 97.3 fL (ref 80.0–100.0)
MCV: 97.9 fL (ref 80.0–100.0)
Platelets: 89 10*3/uL — ABNORMAL LOW (ref 150–400)
Platelets: 103 10*3/uL — ABNORMAL LOW (ref 150–400)
RBC: 2.57 MIL/uL — ABNORMAL LOW (ref 4.22–5.81)
RBC: 2.81 MIL/uL — ABNORMAL LOW (ref 4.22–5.81)
RDW: 17 % — ABNORMAL HIGH (ref 11.5–15.5)
RDW: 17 % — ABNORMAL HIGH (ref 11.5–15.5)
WBC: 6.8 10*3/uL (ref 4.0–10.5)
WBC: 7.4 10*3/uL (ref 4.0–10.5)
nRBC: 0 % (ref 0.0–0.2)
nRBC: 0 % (ref 0.0–0.2)

## 2020-07-27 LAB — BASIC METABOLIC PANEL
Anion gap: 7 (ref 5–15)
BUN: 27 mg/dL — ABNORMAL HIGH (ref 8–23)
CO2: 24 mmol/L (ref 22–32)
Calcium: 8.1 mg/dL — ABNORMAL LOW (ref 8.9–10.3)
Chloride: 105 mmol/L (ref 98–111)
Creatinine, Ser: 1.71 mg/dL — ABNORMAL HIGH (ref 0.61–1.24)
GFR calc Af Amer: 39 mL/min — ABNORMAL LOW (ref >60)
GFR calc non Af Amer: 34 mL/min — ABNORMAL LOW (ref >60)
Glucose, Bld: 110 mg/dL — ABNORMAL HIGH (ref 70–99)
Potassium: 4.1 mmol/L (ref 3.5–5.1)
Sodium: 136 mmol/L (ref 135–145)

## 2020-07-27 LAB — TYPE AND SCREEN
ABO/RH(D): A POS
Antibody Screen: NEGATIVE
Unit division: 0

## 2020-07-27 LAB — BPAM RBC
Blood Product Expiration Date: 202108182359
ISSUE DATE / TIME: 202107281148
Unit Type and Rh: 6200

## 2020-07-27 LAB — SARS CORONAVIRUS 2 BY RT PCR (HOSPITAL ORDER, PERFORMED IN ~~LOC~~ HOSPITAL LAB): SARS Coronavirus 2: NEGATIVE

## 2020-07-27 MED ORDER — TRAMADOL HCL 50 MG PO TABS
50.0000 mg | ORAL_TABLET | Freq: Four times a day (QID) | ORAL | Status: DC | PRN
  Administered 2020-07-28: 100 mg via ORAL
  Filled 2020-07-27: qty 1
  Filled 2020-07-27: qty 2

## 2020-07-27 MED ORDER — OXYCODONE HCL 5 MG PO TABS: 2.5000 mg | ORAL_TABLET | Freq: Four times a day (QID) | ORAL | Status: DC | PRN

## 2020-07-27 NOTE — NC FL2 (Signed)
[place] LEVEL OF CARE SCREENING TOOL     IDENTIFICATION  Patient Name: Douglas Walker Birthdate: 08-10-1925 Sex: male Admission Date (Current Location): 07/24/2020  [facility] and [place] Number:  Herbalist and Address:  [facility], [place] [address], [place], [place] [address]      Provider Number: [id number]  Attending Physician Name and Address:  Edison Pace, Md, MD  Relative Name and Phone Number:       Current Level of Care: Hospital Recommended Level of Care: [place] Prior Approval Number:    Date Approved/Denied:   PASRR Number: [id number]  Discharge Plan: SNF    Current Diagnoses: Patient Active Problem List   Diagnosis Date Noted   Hematoma, chest wall 07/24/2020   Exudative age-related macular degeneration of right eye with inactive choroidal neovascularization ([place]) 05/10/2020   Exudative age-related macular degeneration of left eye with inactive choroidal neovascularization ([place]) 05/10/2020   Retinal hemorrhage of right eye 05/10/2020   Exposure keratopathy, bilateral 05/10/2020   Bilateral dry eyes 05/10/2020   Advanced nonexudative age-related macular degeneration of both eyes with subfoveal involvement 05/10/2020   Sick sinus syndrome ([place]) 12/06/2018   Complete heart block ([place]) 12/04/2018   Chronic low back pain 12/15/2017   Hereditary and idiopathic peripheral neuropathy 07/01/2016   Essential tremor 02/28/2016   BPPV (benign paroxysmal positional vertigo) 06/19/2015   Dizziness 06/18/2015   Bacteria in urine 06/18/2015   CKD (chronic kidney disease) stage 3, GFR 30-59 ml/min: Probable 06/18/2015   Chronic diastolic CHF (congestive heart failure) ([place]) 05/30/2015   Fall    Syncope 05/19/2015   UTI (lower urinary tract infection) 05/19/2015   Hypotension 05/19/2015   Dehydration 05/19/2015   Fracture of rib of left side 05/19/2015   Syncope and collapse  05/19/2015   Mobitz type II atrioventricular block 09/13/2014   Essential hypertension 09/13/2014   Chronic anticoagulation 09/13/2014   Tachycardia-bradycardia syndrome ([place]) 09/13/2014   Encounter for therapeutic drug monitoring 02/09/2014   Paroxysmal atrial fibrillation ([place]) 10/28/2013   Thrombocytopenia ([place]) 04/06/2013    Orientation RESPIRATION BLADDER Height & Weight     Time, Self, Situation, Place  Normal Incontinent Weight: 198 lb (89.8 kg) Height:  5\' 11"  (180.3 cm)  BEHAVIORAL SYMPTOMS/MOOD NEUROLOGICAL BOWEL NUTRITION STATUS      Continent Diet  AMBULATORY STATUS COMMUNICATION OF NEEDS Skin   Extensive Assist Verbally Surgical wounds (closed incision on back and chest with gauze dressings)                       Personal Care Assistance Level of Assistance  Bathing, Feeding, Dressing Bathing Assistance: Maximum assistance Feeding assistance: Independent Dressing Assistance: Maximum assistance     Functional Limitations Info  Sight, Hearing, Speech Sight Info: Adequate Hearing Info: Impaired Speech Info: Adequate    SPECIAL CARE FACTORS FREQUENCY  OT (By licensed OT), PT (By licensed PT)     PT Frequency: 5x week OT Frequency: 5x week            Contractures Contractures Info: Not present    Additional Factors Info  Code Status, Allergies, Psychotropic Code Status Info: DNR Allergies Info: Sulfa Antibiotics Psychotropic Info: DULoxetine (CYMBALTA) DR capsule 20 mg 2x daily with breakfast and lunch         Current Medications (07/27/2020):  This is the current hospital active medication list Current Facility-Administered Medications  Medication Dose Route Frequency Provider Last Rate Last Admin  acetaminophen (TYLENOL) tablet 650 mg  650 mg Oral Q6H Norm Parcel, PA-C   650 mg at 07/27/20 0754   DULoxetine (CYMBALTA) DR capsule 20 mg  20 mg Oral BID WC Barkley Boards R, PA-C   20 mg at 07/27/20 0754   furosemide (LASIX)  tablet 60 mg  60 mg Oral Daily Norm Parcel, PA-C   60 mg at 07/26/20 1003   levothyroxine (SYNTHROID) tablet 25 mcg  25 mcg Oral QAC breakfast Norm Parcel, PA-C   25 mcg at 07/27/20 0538   lidocaine (LIDODERM) 5 % 1 patch  1 patch Transdermal Q24H Norm Parcel, PA-C   1 patch at 07/26/20 1012   losartan (COZAAR) tablet 50 mg  50 mg Oral Daily Norm Parcel, PA-C   50 mg at 07/26/20 1003   methocarbamol (ROBAXIN) tablet 500 mg  500 mg Oral TID Norm Parcel, PA-C   500 mg at 07/27/20 3716   metoprolol tartrate (LOPRESSOR) injection 5 mg  5 mg Intravenous Q6H PRN Norm Parcel, PA-C       multivitamin (PROSIGHT) tablet 1 tablet  1 tablet Oral Daily Norm Parcel, PA-C   1 tablet at 07/26/20 1003   ondansetron (ZOFRAN-ODT) disintegrating tablet 4 mg  4 mg Oral Q6H PRN Norm Parcel, PA-C       Or   ondansetron [facility]) injection 4 mg  4 mg Intravenous Q6H PRN Norm Parcel, PA-C       oxyCODONE (Oxy IR/ROXICODONE) immediate release tablet 2.5 mg  2.5 mg Oral Q6H PRN Norm Parcel, PA-C       pregabalin (LYRICA) capsule 25 mg  25 mg Oral QHS Norm Parcel, PA-C   25 mg at 07/26/20 2011   primidone (MYSOLINE) tablet 250 mg  250 mg Oral BID WC Barkley Boards R, PA-C   250 mg at 07/27/20 0754   tamsulosin (FLOMAX) capsule 0.4 mg  0.4 mg Oral QPM Barkley Boards R, PA-C   0.4 mg at 07/26/20 1744   topiramate (TOPAMAX) tablet 50 mg  50 mg Oral BID Norm Parcel, PA-C   50 mg at 07/26/20 2011   traMADol (ULTRAM) tablet 50-100 mg  50-100 mg Oral Q6H PRN Norm Parcel, PA-C       vitamin B-12 (CYANOCOBALAMIN) tablet 1,000 mcg  1,000 mcg Oral QODAY Norm Parcel, PA-C   1,000 mcg at 07/25/20 1206     Discharge Medications: Please see discharge summary for a list of discharge medications.  Relevant Imaging Results:  Relevant Lab Results:   Additional Information SSN: [place], [place]

## 2020-07-27 NOTE — Progress Notes (Signed)
Physical Therapy Treatment Patient Details Name: Douglas Walker MRN: [id number] DOB: 12/14/1925 Today's Date: 07/27/2020    History of Present Illness Pt is a 86 y.o. M with significant PMH of skin CA, chronic low back pain, CKD, atrial fibrillation, pacemaker, TIA, who presents s/p resection of a right lateral chest wall mass on 7/21 with a postoperative hematoma. Now s/p evacuation of right chest wall hematoma.     PT Comments    Pt was seen for progression of gait and ex's with a better tolerance, better control of standing and improved pain with repositioning on chair.  Family in to offer encouragement and will be available with pt at dc.  Follow acutely for goals of therapy, and to make progress with gait and balance to reduce need to stay in rehab.  Pt is motivated and working well, should make excellent progress in rehab.   Follow Up Recommendations  SNF     Equipment Recommendations  3in1 (PT)    Recommendations for Other Services       Precautions / Restrictions Precautions Precautions: Fall;Other (comment) Precaution Comments: binder Restrictions Weight Bearing Restrictions: No Other Position/Activity Restrictions: R side of back is painful from hematoma evacuation    Mobility  Bed Mobility Overal bed mobility: Needs Assistance             General bed mobility comments: in chair when PT arrived  Transfers Overall transfer level: Needs assistance Equipment used: Rolling walker (2 wheeled) Transfers: Sit to/from Stand Sit to Stand: Mod assist         General transfer comment: mod to power up   Ambulation/Gait Ambulation/Gait assistance: Min guard Gait Distance (Feet): 40 Feet Assistive device: Rolling walker (2 wheeled) Gait Pattern/deviations: Step-to pattern;Decreased stride length;Trunk flexed Gait velocity: decreased Gait velocity interpretation: <1.31 ft/sec, indicative of household ambulator General Gait Details: smaller strides     Stairs             Wheelchair Mobility    Modified Rankin (Stroke Patients Only)       Balance Overall balance assessment: Needs assistance Sitting-balance support: Feet supported Sitting balance-Leahy Scale: Good     Standing balance support: Bilateral upper extremity supported;During functional activity Standing balance-Leahy Scale: Poor                              Cognition Arousal/Alertness: Awake/alert Behavior During Therapy: WFL for tasks assessed/performed Overall Cognitive Status: Within Functional Limits for tasks assessed                               Problem Solving: Requires verbal cues;Requires tactile cues General Comments: pt is hearing impaired and taking extra time to understand at least in part from this      Exercises General Exercises - Lower Extremity Ankle Circles/Pumps: AROM;5 reps Long Arc Quad: Strengthening;10 reps Heel Slides: Strengthening;10 reps Hip ABduction/ADduction: Strengthening;10 reps    General Comments General comments (skin integrity, edema, etc.): pt is in some pain from hematoma and feeling weak but LE mm's are stronger      Pertinent Vitals/Pain Pain Assessment: Faces Faces Pain Scale: Hurts little more Pain Location: back Pain Descriptors / Indicators: Grimacing;Guarding Pain Intervention(s): Limited activity within patient's tolerance;Monitored during session;Premedicated before session;Repositioned    Home Living  Prior Function            PT Goals (current goals can now be found in the care plan section) Acute Rehab PT Goals Patient Stated Goal: be able to walk again Progress towards PT goals: Progressing toward goals    Frequency    Min 3X/week      PT Plan Current plan remains appropriate    Co-evaluation              AM-PAC PT "6 Clicks" Mobility   Outcome Measure  Help needed turning from your back to your side while in a  flat bed without using bedrails?: None Help needed moving from lying on your back to sitting on the side of a flat bed without using bedrails?: A Little Help needed moving to and from a bed to a chair (including a wheelchair)?: A Lot Help needed standing up from a chair using your arms (e.g., wheelchair or bedside chair)?: A Little Help needed to walk in hospital room?: A Little Help needed climbing 3-5 steps with a railing? : A Lot 6 Click Score: 17    End of Session Equipment Utilized During Treatment: Gait belt Activity Tolerance: Patient limited by fatigue Patient left: with call bell/phone within reach;in chair;with chair alarm set;with family/visitor present Nurse Communication: Mobility status PT Visit Diagnosis: Unsteadiness on feet (R26.81);Muscle weakness (generalized) (M62.81);Difficulty in walking, not elsewhere classified (R26.2);Pain Pain - Right/Left: Right Pain - part of body:  (back)     Time: 0263-7858 PT Time Calculation (min) (ACUTE ONLY): 25 min  Charges:  $Gait Training: 8-22 mins $Therapeutic Exercise: 8-22 mins                     Ramond Dial 07/27/2020, 7:51 PM  Mee Hives, PT MS Acute Rehab Dept. Number: [place]

## 2020-07-27 NOTE — Evaluation (Signed)
Occupational Therapy Evaluation Patient Details Name: Douglas Walker MRN: [id number] DOB: Feb 05, 1925 Today's Date: 07/27/2020    History of Present Illness Pt is a 86 y.o. M with significant PMH of skin CA, chronic low back pain, CKD, atrial fibrillation, pacemaker, TIA, who presents s/p resection of a right lateral chest wall mass on 7/21 with a postoperative hematoma. Now s/p evacuation of right chest wall hematoma.    Clinical Impression   PTA pt living alone with family assist for IADL. Pt reports he would stay at sons home until evening, but spend nights alone. He also endorses using RW PTA. At time of eval, pt presents with ability to complete bed mobility at min A and sit <> stands at mod A with RW. Pt was able to pivot to [facility] for BM with mod A and cues for safe hand placement on RW. He currently requires max A for posterior peri care and total A for LB dressing. Noted cognitive deficits in safety and awareness of deficits. Given current status, recommend SNF at d/c to support safety, BADL engagement, and to reduce risk of falls. OT will continue to follow per POC listed below.    Follow Up Recommendations  SNF    Equipment Recommendations  Hospital bed    Recommendations for Other Services       Precautions / Restrictions Precautions Precautions: Fall;Other (comment) Precaution Comments: binder Restrictions Weight Bearing Restrictions: No      Mobility Bed Mobility Overal bed mobility: Needs Assistance Bed Mobility: Sidelying to Sit   Sidelying to sit: Min assist       General bed mobility comments: pt required assist to manage BLE translation to EOB and to support trunk to come into sitting  Transfers Overall transfer level: Needs assistance Equipment used: Rolling walker (2 wheeled) Transfers: Sit to/from Stand Sit to Stand: Mod assist         General transfer comment: pt required assist to power up into full stand with cues for appropriate hand  placement on RW    Balance Overall balance assessment: Needs assistance Sitting-balance support: Feet supported Sitting balance-Leahy Scale: Fair     Standing balance support: Bilateral upper extremity supported Standing balance-Leahy Scale: Poor Standing balance comment: Reliant on BUE support                           ADL either performed or assessed with clinical judgement   ADL Overall ADL's : Needs assistance/impaired Eating/Feeding: Set up;Sitting   Grooming: Set up;Sitting   Upper Body Bathing: Sitting;Moderate assistance   Lower Body Bathing: Maximal assistance;Sit to/from stand;Sitting/lateral leans   Upper Body Dressing : Minimal assistance;Sitting   Lower Body Dressing: Maximal assistance;Sit to/from stand;Sitting/lateral leans Lower Body Dressing Details (indicate cue type and reason): to don socks Toilet Transfer: Moderate assistance;Stand-pivot;RW;BSC Toilet Transfer Details (indicate cue type and reason): assist to rise and steady off of bed to pivot to [facility]. Assistance needed to manage posterior lean and to sequence transfer [place] and Hygiene: Maximal assistance;Sit to/from stand Toileting - Clothing Manipulation Details (indicate cue type and reason): max A for posterior peri care after BM     Functional mobility during ADLs: Moderate assistance;Rolling walker;Cueing for safety (pivots only)       Vision Baseline Vision/History: Wears glasses;Macular Degeneration Wears Glasses: At all times Patient Visual Report: No change from baseline Additional Comments: reports his macular degeneration at baseline limits his ability to watch TV unless he is  at home and sits very close to it     Perception     Praxis      Pertinent Vitals/Pain Pain Assessment: Faces Faces Pain Scale: Hurts little more Pain Location: back Pain Descriptors / Indicators: Grimacing;Guarding Pain Intervention(s): Limited activity within patient's  tolerance;Monitored during session     Hand Dominance     Extremity/Trunk Assessment Upper Extremity Assessment Upper Extremity Assessment: Generalized weakness   Lower Extremity Assessment Lower Extremity Assessment: Generalized weakness       Communication Communication Communication: HOH   Cognition Arousal/Alertness: Awake/alert Behavior During Therapy: Flat affect Overall Cognitive Status: Difficult to assess Area of Impairment: Problem solving                             Problem Solving: Slow processing;Requires verbal cues General Comments: difficult to fully assess 2/2 extreme HOH. Increased time to process requests. Decreased awareness of safety   General Comments       Exercises     Shoulder Instructions      Home Living Family/patient expects to be discharged to:: Private residence Living Arrangements: Alone Available Help at Discharge: Family;Available PRN/intermittently Type of Home: House Home Access: Stairs to enter Entrance Stairs-Number of Steps: 4   Home Layout: Able to live on main level with bedroom/bathroom     Bathroom Shower/Tub: Tub/shower unit         Home Equipment: Other (comment);Walker - 2 wheels;Wheelchair - manual (walking stick)   Additional Comments: Pt spends the day at his son's house until 6 PM, sleeps alone at night at his house      Prior Functioning/Environment Level of Independence: Needs assistance  Gait / Transfers Assistance Needed: Uses walker ADL's / Homemaking Assistance Needed: Pt son assists with IADL's i.e. cooking            OT Problem List: Decreased strength;Decreased knowledge of use of DME or AE;Decreased activity tolerance;Impaired balance (sitting and/or standing);Decreased safety awareness;Cardiopulmonary status limiting activity;Pain      OT Treatment/Interventions: Self-care/ADL training;Therapeutic exercise;Patient/family education;Balance training;Energy conservation;Therapeutic  activities;DME and/or AE instruction    OT Goals(Current goals can be found in the care plan section) Acute Rehab OT Goals Patient Stated Goal: be able to walk again OT Goal Formulation: With patient Time For Goal Achievement: 08/10/20 Potential to Achieve Goals: Good  OT Frequency: Min 2X/week   Barriers to D/C:            Co-evaluation              AM-PAC OT "6 Clicks" Daily Activity     Outcome Measure Help from another person eating meals?: A Little Help from another person taking care of personal grooming?: A Little Help from another person toileting, which includes using toliet, bedpan, or urinal?: A Lot Help from another person bathing (including washing, rinsing, drying)?: A Lot Help from another person to put on and taking off regular upper body clothing?: A Lot Help from another person to put on and taking off regular lower body clothing?: Total 6 Click Score: 13   End of Session Equipment Utilized During Treatment: Gait belt;Rolling walker Nurse Communication: Mobility status  Activity Tolerance: Patient tolerated treatment well Patient left: in chair;with call bell/phone within reach;with chair alarm set  OT Visit Diagnosis: Unsteadiness on feet (R26.81);Other abnormalities of gait and mobility (R26.89);Muscle weakness (generalized) (M62.81);History of falling (Z91.81)  Time: 1901-2224 OT Time Calculation (min): 32 min Charges:  OT General Charges $OT Visit: 1 Visit OT Evaluation $OT Eval Moderate Complexity: 1 Mod OT Treatments $Self Care/Home Management : 8-22 mins  Zenovia Jarred, MSOT, OTR/L Acute Rehabilitation Services [facility] Office Number: [phone] Pager: [phone]  Zenovia Jarred 07/27/2020, 11:44 AM

## 2020-07-27 NOTE — TOC Progression Note (Addendum)
Transition of Care [facility]) - Progression Note    Patient Details  Name: DANTHONY KENDRIX MRN: [id number] Date of Birth: [birth year]  Transition of Care [facility]) CM/SW [place], LCSW Phone Number: 07/27/2020, 4:15 PM  Clinical Narrative:    [facility] able to accept patient tomorrow pending updated COVID test. PA paged.  CSW made patient's son aware that [facility] had a staff member test positive (but have retested everyone and everyone is negative) but will not allow visitation for the next week. Patient's son aware and still would like to proceed with [facility]. He is requesting [facility] for transport. DNR on chart to be signed by rounding MD tomorrow.     Expected Discharge Plan: [place] (vs SNF) Barriers to Discharge: Continued Medical Work up  Expected Discharge Plan and Services Expected Discharge Plan: [place] (vs SNF) In-house Referral: Clinical Social Work Discharge Planning Services: CM Consult   Living arrangements for the past 2 months: Single Family Home                                       Social Determinants of Health (SDOH) Interventions    Readmission Risk Interventions Readmission Risk Prevention Plan 07/26/2020  Transportation Screening Complete  PCP or Specialist Appt within 5-7 Days Not Complete  Not Complete comments pending disposition  Home Care Screening Complete  Medication Review (RN CM) Referral to Pharmacy  Some recent data might be hidden

## 2020-07-27 NOTE — Progress Notes (Signed)
[facility] Progress Note  3 Days Post-Op  Subjective: Patient reports continued pain in R side although he states it is a little better than yesterday. Family requested that oxycodone usage be decreased due to patient seeming drowsy yesterday and concerns for addiction. Patient has tolerated tramadol well in the past and we discussed trying to use that more instead. PT recommended SNF placement for patient, however patient and family are resistant to this due to concerns for COVID safety as well as not being able to visit if he is there for several weeks. They also are concerned about bringing him home and the risk of falling.   Objective: Vital signs in last 24 hours: Temp:  [98.3 F (36.8 C)-98.9 F (37.2 C)] 98.3 F (36.8 C) (07/29 0556) Pulse Rate:  [61-70] 69 (07/29 0556) Resp:  [16] 16 (07/29 0556) BP: (102-113)/(50-62) 109/55 (07/29 0556) SpO2:  [95 %-98 %] 96 % (07/29 0556) Last BM Date: 07/22/20  Intake/Output from previous day: 07/28 0701 - 07/29 0700 In: 3646.2 [P.O.:297; I.V.:3045; Blood:304.2] Out: 740 [Urine:740] Intake/Output this shift: No intake/output data recorded.  PE: General: pleasant, WD,chronically ill appearing elderlymale who is laying in bed in NAD Heart: regular, rate, and rhythm. Palpable radial and pedal pulses bilaterally Lungs: CTAB, no wheezes, rhonchi, or rales noted. Respiratory effort nonlabored Abd: soft, NT, ND, +BS, no masses, hernias, or organomegaly Back: R flank/back hematoma with ecchymosis, incision c/d/i with sutures in place   Lab Results:  Recent Labs    07/26/20 1551 07/27/20 0127  WBC 8.4 6.8  HGB 8.9* 8.2*  HCT 26.5* 25.0*  PLT 93* 89*   BMET Recent Labs    07/26/20 0501 07/27/20 0127  NA 138 136  K 4.3 4.1  CL 106 105  CO2 24 24  GLUCOSE 114* 110*  BUN 27* 27*  CREATININE 1.68* 1.71*  CALCIUM 8.3* 8.1*   PT/INR Recent Labs    07/24/20 1335  LABPROT 18.8*  INR 1.6*   CMP     Component  Value Date/Time   NA 136 07/27/2020 0127   NA 140 01/28/2019 1112   K 4.1 07/27/2020 0127   CL 105 07/27/2020 0127   CO2 24 07/27/2020 0127   GLUCOSE 110 (H) 07/27/2020 0127   BUN 27 (H) 07/27/2020 0127   BUN 35 01/28/2019 1112   CREATININE 1.71 (H) 07/27/2020 0127   CALCIUM 8.1 (L) 07/27/2020 0127   PROT 5.9 (L) 07/24/2020 1335   ALBUMIN 3.5 07/24/2020 1335   AST 23 07/24/2020 1335   ALT 33 07/24/2020 1335   ALKPHOS 131 (H) 07/24/2020 1335   BILITOT 0.6 07/24/2020 1335   GFRNONAA 34 (L) 07/27/2020 0127   GFRAA 39 (L) 07/27/2020 0127   Lipase     Component Value Date/Time   LIPASE 48 05/19/2015 0630       Studies/Results: No results found.  Anti-infectives: Anti-infectives (From admission, onward)   Start     Dose/Rate Route Frequency Ordered Stop   07/24/20 1345  ceFAZolin (ANCEF) IVPB 2g/100 mL premix        2 g 200 mL/hr over 30 Minutes Intravenous  Once 07/24/20 1335 07/24/20 1534   07/24/20 1339  ceFAZolin (ANCEF) 2-4 GM/100ML-% IVPB       Note to Pharmacy: Henrine Screws   : cabinet override      07/24/20 1339 07/24/20 1536       Assessment/Plan Post-operative R chest wall hematoma S/p evacuation and closure 7/26 Dr. Ninfa Linden - POD#7/POD#3 - hematoma  re-accumulated more posteriorly - continue ice and binder - trend CBC - s/p 1 unit PRBC yesterday, h/h 8.2/25 this AM, platelets 89K - recheck CBC this afternoon  - attempted to aspirate 7/27 but could not get anything out - would not recommend taking back to OR at this time - PT recommending SNF, family does not want that due to concerns about COVID and not being able to see him - continue to hold anticoagulation, discussed with family that he will need to stay off of this for a minimum of 1 week and that he will need to have a risk/benefit discussion with PCP about resuming/continuing coumadin  - at this point patient is medically stable for discharge, however disposition remains unclear  FEN: reg diet,  SLIV VTE: SCDs ID: Ancef 7/26 Follow up: Dr. Barry Dienes in 2 weeks  LOS: 3 days    Norm Parcel , [facility] 07/27/2020, 8:43 AM Please see Amion for pager number during day hours 7:00am-4:30pm

## 2020-07-27 NOTE — TOC Progression Note (Signed)
Transition of Care [facility] - Progression Note    Patient Details  Name: Douglas Walker MRN: [id number] Date of Birth: 1925/10/01  Transition of Care [facility]) CM/SW [place], [place] Phone Number: 07/27/2020, 10:50 AM  Clinical Narrative:    CSW spoke with [name] and pt daughter in law via telephone at [phone]. Pt from home with family, reconfirmed home address and PCP and home arrangements. Pt son and daughter in law are hesitant about pt returning right home just due to increased mobility needs. The preferred plan for pt as discussed with pt family is for: 1. A referral to [facility] where pt has been before, if [facility] can take pt they would like to accept for short term rehab (they understand how long this is is dependent on medical necessity).  2. If [facility] cannot take pt for pt to be referred to [facility] for [place] if eligible.   TOC team has sent secure referral through hub to [facility] and will alert [facility] if [facility] cannot accept. Pt has been vaccinated against COVID-19.    Expected Discharge Plan: [place] (vs SNF) Barriers to Discharge: Continued Medical Work up  Expected Discharge Plan and Services Expected Discharge Plan: [place] (vs SNF) In-house Referral: Clinical Social Work Discharge Planning Services: CM Consult Living arrangements for the past 2 months: Single Family Home  Readmission Risk Interventions Readmission Risk Prevention Plan 07/26/2020  Transportation Screening Complete  PCP or Specialist Appt within 5-7 Days Not Complete  Not Complete comments pending disposition  Home Care Screening Complete  Medication Review (RN CM) Referral to Pharmacy  Some recent data might be hidden

## 2020-07-28 DIAGNOSIS — H3530 Unspecified macular degeneration: Secondary | ICD-10-CM | POA: Diagnosis not present

## 2020-07-28 DIAGNOSIS — M728 Other fibroblastic disorders: Secondary | ICD-10-CM | POA: Diagnosis not present

## 2020-07-28 DIAGNOSIS — M7989 Other specified soft tissue disorders: Secondary | ICD-10-CM | POA: Diagnosis not present

## 2020-07-28 DIAGNOSIS — I5030 Unspecified diastolic (congestive) heart failure: Secondary | ICD-10-CM | POA: Diagnosis not present

## 2020-07-28 DIAGNOSIS — I959 Hypotension, unspecified: Secondary | ICD-10-CM | POA: Diagnosis not present

## 2020-07-28 DIAGNOSIS — N400 Enlarged prostate without lower urinary tract symptoms: Secondary | ICD-10-CM | POA: Diagnosis not present

## 2020-07-28 DIAGNOSIS — H04123 Dry eye syndrome of bilateral lacrimal glands: Secondary | ICD-10-CM | POA: Diagnosis not present

## 2020-07-28 DIAGNOSIS — M2550 Pain in unspecified joint: Secondary | ICD-10-CM | POA: Diagnosis not present

## 2020-07-28 DIAGNOSIS — I10 Essential (primary) hypertension: Secondary | ICD-10-CM | POA: Diagnosis not present

## 2020-07-28 DIAGNOSIS — H8110 Benign paroxysmal vertigo, unspecified ear: Secondary | ICD-10-CM | POA: Diagnosis not present

## 2020-07-28 DIAGNOSIS — Z7401 Bed confinement status: Secondary | ICD-10-CM | POA: Diagnosis not present

## 2020-07-28 DIAGNOSIS — S20219D Contusion of unspecified front wall of thorax, subsequent encounter: Secondary | ICD-10-CM | POA: Diagnosis not present

## 2020-07-28 DIAGNOSIS — F329 Major depressive disorder, single episode, unspecified: Secondary | ICD-10-CM | POA: Diagnosis not present

## 2020-07-28 DIAGNOSIS — R222 Localized swelling, mass and lump, trunk: Secondary | ICD-10-CM | POA: Diagnosis not present

## 2020-07-28 DIAGNOSIS — I4891 Unspecified atrial fibrillation: Secondary | ICD-10-CM | POA: Diagnosis not present

## 2020-07-28 DIAGNOSIS — N183 Chronic kidney disease, stage 3 unspecified: Secondary | ICD-10-CM | POA: Diagnosis not present

## 2020-07-28 DIAGNOSIS — G252 Other specified forms of tremor: Secondary | ICD-10-CM | POA: Diagnosis not present

## 2020-07-28 DIAGNOSIS — Z87440 Personal history of urinary (tract) infections: Secondary | ICD-10-CM | POA: Diagnosis not present

## 2020-07-28 DIAGNOSIS — Z8673 Personal history of transient ischemic attack (TIA), and cerebral infarction without residual deficits: Secondary | ICD-10-CM | POA: Diagnosis not present

## 2020-07-28 DIAGNOSIS — M545 Low back pain: Secondary | ICD-10-CM | POA: Diagnosis not present

## 2020-07-28 DIAGNOSIS — Z7901 Long term (current) use of anticoagulants: Secondary | ICD-10-CM | POA: Diagnosis not present

## 2020-07-28 DIAGNOSIS — G629 Polyneuropathy, unspecified: Secondary | ICD-10-CM | POA: Diagnosis not present

## 2020-07-28 DIAGNOSIS — S20219A Contusion of unspecified front wall of thorax, initial encounter: Secondary | ICD-10-CM | POA: Diagnosis not present

## 2020-07-28 DIAGNOSIS — I495 Sick sinus syndrome: Secondary | ICD-10-CM | POA: Diagnosis not present

## 2020-07-28 DIAGNOSIS — I480 Paroxysmal atrial fibrillation: Secondary | ICD-10-CM | POA: Diagnosis not present

## 2020-07-28 DIAGNOSIS — I5032 Chronic diastolic (congestive) heart failure: Secondary | ICD-10-CM | POA: Diagnosis not present

## 2020-07-28 DIAGNOSIS — Z950 Presence of cardiac pacemaker: Secondary | ICD-10-CM | POA: Diagnosis not present

## 2020-07-28 DIAGNOSIS — G8929 Other chronic pain: Secondary | ICD-10-CM | POA: Diagnosis not present

## 2020-07-28 LAB — BASIC METABOLIC PANEL
Anion gap: 10 (ref 5–15)
BUN: 29 mg/dL — ABNORMAL HIGH (ref 8–23)
CO2: 23 mmol/L (ref 22–32)
Calcium: 8.2 mg/dL — ABNORMAL LOW (ref 8.9–10.3)
Chloride: 104 mmol/L (ref 98–111)
Creatinine, Ser: 1.72 mg/dL — ABNORMAL HIGH (ref 0.61–1.24)
GFR calc Af Amer: 39 mL/min — ABNORMAL LOW (ref >60)
GFR calc non Af Amer: 34 mL/min — ABNORMAL LOW (ref >60)
Glucose, Bld: 100 mg/dL — ABNORMAL HIGH (ref 70–99)
Potassium: 3.9 mmol/L (ref 3.5–5.1)
Sodium: 137 mmol/L (ref 135–145)

## 2020-07-28 LAB — CBC
HCT: 24.9 % — ABNORMAL LOW (ref 39.0–52.0)
Hemoglobin: 8.4 g/dL — ABNORMAL LOW (ref 13.0–17.0)
MCH: 32.9 pg (ref 26.0–34.0)
MCHC: 33.7 g/dL (ref 30.0–36.0)
MCV: 97.6 fL (ref 80.0–100.0)
Platelets: 115 10*3/uL — ABNORMAL LOW (ref 150–400)
RBC: 2.55 MIL/uL — ABNORMAL LOW (ref 4.22–5.81)
RDW: 16.6 % — ABNORMAL HIGH (ref 11.5–15.5)
WBC: 6.9 10*3/uL (ref 4.0–10.5)
nRBC: 0 % (ref 0.0–0.2)

## 2020-07-28 MED ORDER — ENOXAPARIN SODIUM 40 MG/0.4ML ~~LOC~~ SOLN: 40.0000 mg | SUBCUTANEOUS | Status: DC

## 2020-07-28 MED ORDER — TRAMADOL HCL 50 MG PO TABS: 50.0000 mg | ORAL_TABLET | Freq: Four times a day (QID) | ORAL | 0 refills | Status: DC | PRN

## 2020-07-28 NOTE — Discharge Instructions (Signed)
Please follow up with your PCP to discuss your risk vs benefit of restarting your anti-coagulation  Please wear your binder across hematoma as able Please follow up in the office with Dr. Barry Dienes for follow up and suture removal  Hematoma A hematoma is a collection of blood. A hematoma can happen:  Under the skin.  In an organ.  In a body space.  In a joint space.  In other tissues. The blood can thicken (clot) to form a lump that you can see and feel. The lump is often hard and may become sore and tender. The lump can be very small or very big. Most hematomas get better in a few days to weeks. However, some hematomas may be serious and need medical care. What are the causes? This condition is caused by:  An injury.  Blood that leaks under the skin.  Problems from surgeries.  Medical conditions that cause bleeding or bruising. What increases the risk? You are more likely to develop this condition if:  You are an older adult.  You use medicines that thin your blood. What are the signs or symptoms? Symptoms depend on where the hematoma is in your body.  If the hematoma is under the skin, there is: ? A firm lump on the body. ? Pain and tenderness in the area. ? Bruising. The skin above the lump may be blue, dark blue, purple-red, or yellowish.  If the hematoma is deep in the tissues or body spaces, there may be: ? Blood in the stomach. This may cause pain in the belly (abdomen), weakness, passing out (fainting), and shortness of breath. ? Blood in the head. This may cause a headache, weakness, trouble speaking or understanding speech, or passing out. How is this diagnosed? This condition is diagnosed based on:  Your medical history.  A physical exam.  Imaging tests, such as ultrasound or CT scan.  Blood tests. How is this treated? Treatment depends on the cause, size, and location of the hematoma. Treatment may include:  Doing nothing. Many hematomas go away on  their own without treatment.  Surgery or close monitoring. This may be needed for large hematomas or hematomas that affect the body's organs.  Medicines. These may be given if a medical condition caused the hematoma. Follow these instructions at home: Managing pain, stiffness, and swelling   If told, put ice on the area. ? Put ice in a plastic bag. ? Place a towel between your skin and the bag. ? Leave the ice on for 20 minutes, 2-3 times a day for the first two days.  If told, put heat on the affected area after putting ice on the area for two days. Use the heat source that your doctor tells you to use. This could be a moist heat pack or a heating pad. To do this: ? Place a towel between your skin and the heat source. ? Leave the heat on for 20-30 minutes. ? Remove the heat if your skin turns bright red. This is very important if you are unable to feel pain, heat, or cold. You may have a greater risk of getting burned.  Raise (elevate) the affected area above the level of your heart while you are sitting or lying down.  Wrap the affected area with an elastic bandage, if told by your doctor. Do not wrap the bandage too tightly.  If your hematoma is on a leg or foot and is painful, your doctor may give you crutches. Use them  as told by your doctor. General instructions  Take over-the-counter and prescription medicines only as told by your doctor.  Keep all follow-up visits as told by your doctor. This is important. Contact a doctor if:  You have a fever.  The swelling or bruising gets worse.  You start to get more hematomas. Get help right away if:  Your pain gets worse.  Your pain is not getting better with medicine.  Your skin over the hematoma breaks or starts to bleed.  Your hematoma is in your chest or belly and you: ? Pass out. ? Feel weak. ? Become short of breath.  You have a hematoma on your scalp that is caused by a fall or injury, and you: ? Have a headache  that gets worse. ? Have trouble speaking or understanding speech. ? Become less alert or you pass out. Summary  A hematoma is a collection of blood in any part of your body.  Most hematomas get better on their own in a few days to weeks. Some may need medical care.  Follow instructions from your doctor about how to care for your hematoma.  Contact a doctor if the swelling or bruising gets worse, or if you are short of breath. This information is not intended to replace advice given to you by your health care provider. Make sure you discuss any questions you have with your health care provider. Document Revised: 05/21/2018 Document Reviewed: 05/21/2018 Elsevier Patient Education  2020 [employer].

## 2020-07-28 NOTE — Progress Notes (Signed)
AVS, signed printed prescription, signed DNR form, and social worker's paperwork placed in discharge packet. Report called and given to Jenny Reichmann, RN at [facility]. All questions answered to satisfaction. PTAR to transport pt with all belongings.

## 2020-07-28 NOTE — TOC Transition Note (Signed)
Transition of Care [facility]) - CM/SW Discharge Note   Patient Details  Name: JEFFERIE HOLSTON MRN: [id number] Date of Birth: 1925-02-23  Transition of Care [facility]) CM/SW Contact:  Amador Cunas, [place] Phone Number: 07/28/2020, 2:07 PM   Clinical Narrative:   Pt for dc to [facility] today. Spoke to [facility] in [place] admissions who confirmed they are prepared to admit pt to room 306 today. RN provided with number for report and PTAR arranged for transport. Son at bedside and agreeable to dc plan. SW signing off at dc.   Wandra Feinstein, MSW, LCSW [phone] (coverage)       Final next level of care: Skilled Nursing Facility Barriers to Discharge: No Barriers Identified   Patient Goals and CMS Choice Patient states their goals for this hospitalization and ongoing recovery are:: feel better CMS Medicare.gov Compare Post Acute Care list provided to:: Other (Comment Required) (family refused, only agreeable to [facility]) Choice offered to / list presented to : Patient, Adult Children  Discharge Placement              Patient chooses bed at: [facility] Patient to be transferred to facility by: [facility] Name of family member notified: Yong Grieser [phone] Patient and family notified of of transfer: 07/28/20  Discharge Plan and Services In-house Referral: Clinical Social Work Discharge Planning Services: CM Consult                                 Social Determinants of Health ([place]) Interventions     Readmission Risk Interventions Readmission Risk Prevention Plan 07/26/2020  Transportation Screening Complete  PCP or Specialist Appt within 5-7 Days Not Complete  Not Complete comments pending disposition  Home Care Screening Complete  Medication Review (RN CM) Referral to Pharmacy  Some recent data might be hidden

## 2020-07-28 NOTE — Progress Notes (Signed)
4 Days Post-Op  Subjective: CC: Patient with continued pain on the right side. Worse with palpation. Tolerating diet without n/v. Family agreeable per CM/SW notes for [facility] short term rehab.   Objective: Vital signs in last 24 hours: Temp:  [98.6 F (37 C)-99 F (37.2 C)] 98.6 F (37 C) (07/30 0409) Pulse Rate:  [69-70] 69 (07/30 0409) Resp:  [16-18] 17 (07/30 0409) BP: (95-128)/(46-62) 125/60 (07/30 0409) SpO2:  [95 %-97 %] 97 % (07/30 0409) Last BM Date: 07/22/20  Intake/Output from previous day: 07/29 0701 - 07/30 0700 In: 720 [P.O.:720] Out: 450 [Urine:450] Intake/Output this shift: No intake/output data recorded.  PE: Genl: pleasant, WD,chronically ill appearing elderlymale who is laying in bed in NAD Heart: RRR Lungs: CTA b/l, normal rate and effort  Abd: soft, NT, ND, +BS Back: R flank/back hematoma with ecchymosis, incision c/d/i with sutures in place  Lab Results:  Recent Labs    07/27/20 1459 07/28/20 0101  WBC 7.4 6.9  HGB 9.2* 8.4*  HCT 27.5* 24.9*  PLT 103* 115*   BMET Recent Labs    07/27/20 0127 07/28/20 0101  NA 136 137  K 4.1 3.9  CL 105 104  CO2 24 23  GLUCOSE 110* 100*  BUN 27* 29*  CREATININE 1.71* 1.72*  CALCIUM 8.1* 8.2*   PT/INR No results for input(s): LABPROT, INR in the last 72 hours. CMP     Component Value Date/Time   NA 137 07/28/2020 0101   NA 140 01/28/2019 1112   K 3.9 07/28/2020 0101   CL 104 07/28/2020 0101   CO2 23 07/28/2020 0101   GLUCOSE 100 (H) 07/28/2020 0101   BUN 29 (H) 07/28/2020 0101   BUN 35 01/28/2019 1112   CREATININE 1.72 (H) 07/28/2020 0101   CALCIUM 8.2 (L) 07/28/2020 0101   PROT 5.9 (L) 07/24/2020 1335   ALBUMIN 3.5 07/24/2020 1335   AST 23 07/24/2020 1335   ALT 33 07/24/2020 1335   ALKPHOS 131 (H) 07/24/2020 1335   BILITOT 0.6 07/24/2020 1335   GFRNONAA 34 (L) 07/28/2020 0101   GFRAA 39 (L) 07/28/2020 0101   Lipase     Component Value Date/Time   LIPASE 48 05/19/2015 0630        Studies/Results: No results found.  Anti-infectives: Anti-infectives (From admission, onward)   Start     Dose/Rate Route Frequency Ordered Stop   07/24/20 1345  ceFAZolin (ANCEF) IVPB 2g/100 mL premix        2 g 200 mL/hr over 30 Minutes Intravenous  Once 07/24/20 1335 07/24/20 1534   07/24/20 1339  ceFAZolin (ANCEF) 2-4 GM/100ML-% IVPB       Note to Pharmacy: Henrine Screws   : cabinet override      07/24/20 1339 07/24/20 1536       Assessment/Plan Post-operative R chest wall hematoma S/p evacuation and closure 7/26 Dr. Ninfa Linden - POD#8/POD#4 - hematoma re-accumulated more posteriorly - Attempted to aspirate7/27but could not get anything out - Would not recommend taking back to OR at this time -Continueice and binder - Trend CBC - s/p 1 unit PRBC 7/28. Hgb stable at 8.4. Plt 115k - Continue to hold anticoagulation, discussed with family that he will need to stay off of this for a minimum of 1 week and that he will need to have a risk/benefit discussion with PCP about resuming/continuing coumadin.  -PT/OT recommending SNF. Family agreeable to [facility]. COVID test yesterday negative. Medically cleared for d/c  - At this point patient  is medically stable for discharge  FEN: Reg diet,SLIV VTE: SCDs, Start Lovenox in AM if hgb stable  ID: Ancef 7/26. None currently  Follow up: Dr. Barry Dienes in 2 weeks. Will discuss with MD timing of suture removal   LOS: 4 days    Jillyn Ledger , [facility] 07/28/2020, 8:32 AM Please see Amion for pager number during day hours 7:00am-4:30pm

## 2020-07-28 NOTE — Progress Notes (Signed)
I called and updated patients son, Douglas Walker. I also confirmed with patients DNR status with the patients son and filled out the form.

## 2020-07-28 NOTE — Plan of Care (Signed)
°  Problem: Education: ?Goal: Knowledge of General Education information will improve ?Description: Including pain rating scale, medication(s)/side effects and non-pharmacologic comfort measures ?Outcome: Progressing ?  ?Problem: Health Behavior/Discharge Planning: ?Goal: Ability to manage health-related needs will improve ?Outcome: Progressing ?  ?Problem: Clinical Measurements: ?Goal: Ability to maintain clinical measurements within normal limits will improve ?Outcome: Progressing ?Goal: Respiratory complications will improve ?Outcome: Progressing ?Goal: Cardiovascular complication will be avoided ?Outcome: Progressing ?  ?Problem: Pain Managment: ?Goal: General experience of comfort will improve ?Outcome: Progressing ?  ?Problem: Safety: ?Goal: Ability to remain free from injury will improve ?Outcome: Progressing ?  ?Problem: Skin Integrity: ?Goal: Risk for impaired skin integrity will decrease ?Outcome: Progressing ?  ?

## 2020-07-29 DIAGNOSIS — I5032 Chronic diastolic (congestive) heart failure: Secondary | ICD-10-CM | POA: Diagnosis not present

## 2020-07-30 DIAGNOSIS — S20219A Contusion of unspecified front wall of thorax, initial encounter: Secondary | ICD-10-CM | POA: Diagnosis not present

## 2020-07-30 DIAGNOSIS — R222 Localized swelling, mass and lump, trunk: Secondary | ICD-10-CM | POA: Diagnosis not present

## 2020-07-30 DIAGNOSIS — N400 Enlarged prostate without lower urinary tract symptoms: Secondary | ICD-10-CM | POA: Diagnosis not present

## 2020-07-30 DIAGNOSIS — I5030 Unspecified diastolic (congestive) heart failure: Secondary | ICD-10-CM | POA: Diagnosis not present

## 2020-08-03 ENCOUNTER — Encounter (HOSPITAL_COMMUNITY): Payer: Self-pay

## 2020-08-08 ENCOUNTER — Encounter (HOSPITAL_COMMUNITY): Payer: Self-pay

## 2020-08-08 ENCOUNTER — Ambulatory Visit: Payer: Medicare Other | Admitting: Neurology

## 2020-08-08 LAB — SURGICAL PATHOLOGY

## 2020-08-09 ENCOUNTER — Other Ambulatory Visit: Payer: Self-pay | Admitting: *Deleted

## 2020-08-09 NOTE — Patient Outreach (Signed)
Member screened for potential [facility] Care Management needs as a benefit of [place] Medicare.  Mr. Kreitzer is receiving skilled therapy at [facility]. Update received from facility SW. Will have more information regarding transition plans after scheduled care plan meeting today.  Will continue to follow for potential [facility] Care Management needs while member resides in SNF.     Marthenia Rolling, MSN-Ed, RN,BSN [place] Acute Care Coordinator [phone] [facility]) [phone]  (Toll free office)

## 2020-08-14 ENCOUNTER — Telehealth: Payer: Self-pay | Admitting: *Deleted

## 2020-08-14 MED ORDER — TOPIRAMATE 50 MG PO TABS: 50.0000 mg | ORAL_TABLET | Freq: Two times a day (BID) | ORAL | 1 refills | Status: DC

## 2020-08-14 NOTE — Telephone Encounter (Signed)
Refill done.  

## 2020-08-16 ENCOUNTER — Other Ambulatory Visit: Payer: Self-pay | Admitting: *Deleted

## 2020-08-16 DIAGNOSIS — M25462 Effusion, left knee: Secondary | ICD-10-CM | POA: Diagnosis not present

## 2020-08-16 DIAGNOSIS — M25562 Pain in left knee: Secondary | ICD-10-CM | POA: Diagnosis not present

## 2020-08-16 NOTE — Patient Outreach (Signed)
[facility] Post- Acute Care Coordinator follow up. Member screened for potential [facility] Care Management needs as a benefit of [place] Medicare.  Verified with [facility] SN[facility] SW that Mr. Baumgardner transitioned to home on 08/15/20. He will have [facility]. Advised Probation officer to contact member's son Bertrand Vowels to discuss [place] Management services.  Telephone call made to Edrei Norgaard (son/DPR) [phone]. Patient identifiers confirmed. [name] states they have everything covered regarding management of member's chronic conditions and medications.   Explained [place] Management program services. [name] politely declines [facility] Care Management follow up. [name] reports member is currently at the PCP office receiving a lidocaine injection in his knee.   Will sign off. [facility] Care Management services currently declined.    Marthenia Rolling, MSN-Ed, RN,BSN [place] Acute Care Coordinator [phone] [facility]) [phone]  (Toll free office)

## 2020-08-17 DIAGNOSIS — I480 Paroxysmal atrial fibrillation: Secondary | ICD-10-CM | POA: Diagnosis not present

## 2020-08-17 DIAGNOSIS — I5032 Chronic diastolic (congestive) heart failure: Secondary | ICD-10-CM | POA: Diagnosis not present

## 2020-08-17 DIAGNOSIS — E349 Endocrine disorder, unspecified: Secondary | ICD-10-CM | POA: Diagnosis not present

## 2020-08-17 DIAGNOSIS — I130 Hypertensive heart and chronic kidney disease with heart failure and stage 1 through stage 4 chronic kidney disease, or unspecified chronic kidney disease: Secondary | ICD-10-CM | POA: Diagnosis not present

## 2020-08-17 DIAGNOSIS — G609 Hereditary and idiopathic neuropathy, unspecified: Secondary | ICD-10-CM | POA: Diagnosis not present

## 2020-08-17 DIAGNOSIS — L7632 Postprocedural hematoma of skin and subcutaneous tissue following other procedure: Secondary | ICD-10-CM | POA: Diagnosis not present

## 2020-08-17 DIAGNOSIS — D509 Iron deficiency anemia, unspecified: Secondary | ICD-10-CM | POA: Diagnosis not present

## 2020-08-17 DIAGNOSIS — I10 Essential (primary) hypertension: Secondary | ICD-10-CM | POA: Diagnosis not present

## 2020-08-17 DIAGNOSIS — G250 Essential tremor: Secondary | ICD-10-CM | POA: Diagnosis not present

## 2020-08-17 DIAGNOSIS — R41841 Cognitive communication deficit: Secondary | ICD-10-CM | POA: Diagnosis not present

## 2020-08-17 DIAGNOSIS — I495 Sick sinus syndrome: Secondary | ICD-10-CM | POA: Diagnosis not present

## 2020-08-17 DIAGNOSIS — I442 Atrioventricular block, complete: Secondary | ICD-10-CM | POA: Diagnosis not present

## 2020-08-17 DIAGNOSIS — M729 Fibroblastic disorder, unspecified: Secondary | ICD-10-CM | POA: Diagnosis not present

## 2020-08-21 ENCOUNTER — Telehealth: Payer: Self-pay | Admitting: *Deleted

## 2020-08-21 NOTE — Telephone Encounter (Signed)
Pt is overdue for an INR check and has been in the Hospital. According to the chart, pt was taken off warfarin and will be evaluated by Dr. Inda Merlin prior to resuming. He has a chest hematoma and has required a lot of care.  Called the son, [name], to get an update and he stated the pt is at home, receiving [facility], and states he is doing better. Also he states the pt does remain off warfarin at this time and has been prescribed ASA 81mg  daily. Will continue to follow/evaluate as they do plan to resume Warfarin once the hematoma is improved, unless a Physician decides otherwise.  Per 07/28/2020 discharge: "Patient is to continue holding anti-coagulation medication and follow up with PCP to discuss risk vs benefit of restarting".

## 2020-08-24 ENCOUNTER — Other Ambulatory Visit: Payer: Self-pay

## 2020-08-24 ENCOUNTER — Emergency Department (HOSPITAL_COMMUNITY)
Admission: EM | Admit: 2020-08-24 | Discharge: 2020-08-24 | Disposition: A | Discharge status: Home / Self Care | Payer: Medicare Other | Attending: Emergency Medicine | Admitting: Emergency Medicine

## 2020-08-24 ENCOUNTER — Emergency Department (HOSPITAL_COMMUNITY): Payer: Medicare Other

## 2020-08-24 DIAGNOSIS — Z79899 Other long term (current) drug therapy: Secondary | ICD-10-CM | POA: Insufficient documentation

## 2020-08-24 DIAGNOSIS — R0902 Hypoxemia: Secondary | ICD-10-CM | POA: Diagnosis not present

## 2020-08-24 DIAGNOSIS — M25561 Pain in right knee: Secondary | ICD-10-CM | POA: Diagnosis not present

## 2020-08-24 DIAGNOSIS — R350 Frequency of micturition: Secondary | ICD-10-CM | POA: Diagnosis not present

## 2020-08-24 DIAGNOSIS — W19XXXA Unspecified fall, initial encounter: Secondary | ICD-10-CM | POA: Diagnosis not present

## 2020-08-24 DIAGNOSIS — N183 Chronic kidney disease, stage 3 unspecified: Secondary | ICD-10-CM | POA: Insufficient documentation

## 2020-08-24 DIAGNOSIS — I5032 Chronic diastolic (congestive) heart failure: Secondary | ICD-10-CM | POA: Diagnosis not present

## 2020-08-24 DIAGNOSIS — I130 Hypertensive heart and chronic kidney disease with heart failure and stage 1 through stage 4 chronic kidney disease, or unspecified chronic kidney disease: Secondary | ICD-10-CM | POA: Diagnosis not present

## 2020-08-24 DIAGNOSIS — Z79890 Hormone replacement therapy (postmenopausal): Secondary | ICD-10-CM | POA: Insufficient documentation

## 2020-08-24 DIAGNOSIS — I959 Hypotension, unspecified: Secondary | ICD-10-CM | POA: Diagnosis not present

## 2020-08-24 DIAGNOSIS — N3000 Acute cystitis without hematuria: Secondary | ICD-10-CM | POA: Diagnosis not present

## 2020-08-24 DIAGNOSIS — Z950 Presence of cardiac pacemaker: Secondary | ICD-10-CM | POA: Diagnosis not present

## 2020-08-24 DIAGNOSIS — I2510 Atherosclerotic heart disease of native coronary artery without angina pectoris: Secondary | ICD-10-CM | POA: Insufficient documentation

## 2020-08-24 DIAGNOSIS — R531 Weakness: Secondary | ICD-10-CM | POA: Diagnosis not present

## 2020-08-24 DIAGNOSIS — Z87891 Personal history of nicotine dependence: Secondary | ICD-10-CM | POA: Insufficient documentation

## 2020-08-24 DIAGNOSIS — R5383 Other fatigue: Secondary | ICD-10-CM | POA: Diagnosis not present

## 2020-08-24 DIAGNOSIS — J811 Chronic pulmonary edema: Secondary | ICD-10-CM | POA: Diagnosis not present

## 2020-08-24 DIAGNOSIS — I10 Essential (primary) hypertension: Secondary | ICD-10-CM | POA: Diagnosis not present

## 2020-08-24 DIAGNOSIS — Z043 Encounter for examination and observation following other accident: Secondary | ICD-10-CM | POA: Diagnosis not present

## 2020-08-24 DIAGNOSIS — M1711 Unilateral primary osteoarthritis, right knee: Secondary | ICD-10-CM | POA: Diagnosis not present

## 2020-08-24 DIAGNOSIS — I708 Atherosclerosis of other arteries: Secondary | ICD-10-CM | POA: Diagnosis not present

## 2020-08-24 LAB — CBC WITH DIFFERENTIAL/PLATELET
Abs Immature Granulocytes: 0.1 10*3/uL — ABNORMAL HIGH (ref 0.00–0.07)
Basophils Absolute: 0 10*3/uL (ref 0.0–0.1)
Basophils Relative: 0 %
Eosinophils Absolute: 0.1 10*3/uL (ref 0.0–0.5)
Eosinophils Relative: 1 %
HCT: 30.8 % — ABNORMAL LOW (ref 39.0–52.0)
Hemoglobin: 9.8 g/dL — ABNORMAL LOW (ref 13.0–17.0)
Immature Granulocytes: 1 %
Lymphocytes Relative: 8 %
Lymphs Abs: 0.9 10*3/uL (ref 0.7–4.0)
MCH: 31.7 pg (ref 26.0–34.0)
MCHC: 31.8 g/dL (ref 30.0–36.0)
MCV: 99.7 fL (ref 80.0–100.0)
Monocytes Absolute: 1.2 10*3/uL — ABNORMAL HIGH (ref 0.1–1.0)
Monocytes Relative: 11 %
Neutro Abs: 8.5 10*3/uL — ABNORMAL HIGH (ref 1.7–7.7)
Neutrophils Relative %: 79 %
Platelets: 140 10*3/uL — ABNORMAL LOW (ref 150–400)
RBC: 3.09 MIL/uL — ABNORMAL LOW (ref 4.22–5.81)
RDW: 16.7 % — ABNORMAL HIGH (ref 11.5–15.5)
WBC: 10.8 10*3/uL — ABNORMAL HIGH (ref 4.0–10.5)
nRBC: 0 % (ref 0.0–0.2)

## 2020-08-24 LAB — URINALYSIS, ROUTINE W REFLEX MICROSCOPIC
Bilirubin Urine: NEGATIVE
Glucose, UA: NEGATIVE mg/dL
Ketones, ur: NEGATIVE mg/dL
Nitrite: NEGATIVE
Protein, ur: 30 mg/dL — AB
Specific Gravity, Urine: 1.013 (ref 1.005–1.030)
WBC, UA: >50 WBC/hpf — ABNORMAL HIGH (ref 0–5)
pH: 7 (ref 5.0–8.0)

## 2020-08-24 LAB — COMPREHENSIVE METABOLIC PANEL
ALT: 21 U/L (ref 0–44)
AST: 20 U/L (ref 15–41)
Albumin: 3.5 g/dL (ref 3.5–5.0)
Alkaline Phosphatase: 154 U/L — ABNORMAL HIGH (ref 38–126)
Anion gap: 10 (ref 5–15)
BUN: 24 mg/dL — ABNORMAL HIGH (ref 8–23)
CO2: 26 mmol/L (ref 22–32)
Calcium: 8.9 mg/dL (ref 8.9–10.3)
Chloride: 103 mmol/L (ref 98–111)
Creatinine, Ser: 1.53 mg/dL — ABNORMAL HIGH (ref 0.61–1.24)
GFR calc Af Amer: 45 mL/min — ABNORMAL LOW (ref >60)
GFR calc non Af Amer: 39 mL/min — ABNORMAL LOW (ref >60)
Glucose, Bld: 102 mg/dL — ABNORMAL HIGH (ref 70–99)
Potassium: 4.2 mmol/L (ref 3.5–5.1)
Sodium: 139 mmol/L (ref 135–145)
Total Bilirubin: 0.6 mg/dL (ref 0.3–1.2)
Total Protein: 7.2 g/dL (ref 6.5–8.1)

## 2020-08-24 LAB — PROTIME-INR
INR: 1.1 (ref 0.8–1.2)
Prothrombin Time: 13.8 seconds (ref 11.4–15.2)

## 2020-08-24 LAB — TSH: TSH: 3.564 u[IU]/mL (ref 0.350–4.500)

## 2020-08-24 LAB — BRAIN NATRIURETIC PEPTIDE: B Natriuretic Peptide: 341.4 pg/mL — ABNORMAL HIGH (ref 0.0–100.0)

## 2020-08-24 MED ORDER — CEPHALEXIN 500 MG PO CAPS
500.0000 mg | ORAL_CAPSULE | Freq: Four times a day (QID) | ORAL | 0 refills | Status: DC
Start: 2020-08-24 — End: 2020-08-30

## 2020-08-24 MED ORDER — CEPHALEXIN 500 MG PO CAPS
500.0000 mg | ORAL_CAPSULE | Freq: Once | ORAL | Status: AC
  Administered 2020-08-24: 500 mg via ORAL
  Filled 2020-08-24: qty 1

## 2020-08-24 NOTE — ED Provider Notes (Signed)
[facility] DEPT Provider Note   CSN: [id number] Arrival date & time: 08/24/20  8676     History Chief Complaint  Patient presents with  . Knee Pain    Right    Douglas Walker is a 86 y.o. male.  The history is provided by the patient, the EMS personnel and medical records (ems report to nursing). No language interpreter was used.  Knee Pain Location:  Knee Time since incident:  1 hour Injury: yes   Mechanism of injury: fall   Fall:    Fall occurred:  Standing   Entrapped after fall: no   Knee location:  R knee Pain details:    Quality: no pain now.   Radiates to:  Does not radiate   Severity:  No pain   Onset quality:  Sudden   Timing:  Rare   Progression:  Resolved Chronicity:  New Dislocation: no   Tetanus status:  Unknown Prior injury to area:  No Relieved by:  Nothing Worsened by:  Nothing Associated symptoms: no back pain, no decreased ROM, no fatigue, no fever, no muscle weakness, no neck pain, no numbness and no stiffness        Past Medical History:  Diagnosis Date  . Arthritis    shoulders and back  . Cancer ([place])    skin cancers  . Chronic low back pain 12/15/2017  . CKD (chronic kidney disease) stage 3, GFR 30-59 ml/min   . Coronary artery disease   . Dysrhythmia     Paroxysmal atrial fibrillation; Tachycardia-bradycardia syndrome  . Essential tremor 02/28/2016  . GERD (gastroesophageal reflux disease)   . Hernia   . History of hiatal hernia   . Hyperlipemia   . Hypertension   . Macular degeneration   . Neuromuscular disorder (HCC)    neuropathy  . Paroxysmal atrial fibrillation ([place]) 10/28/2013  . Presence of permanent cardiac pacemaker 12/04/2018  . Tachycardia-bradycardia syndrome ([place]) 09/13/2014  . TIA (transient ischemic attack)     Patient Active Problem List   Diagnosis Date Noted  . Hematoma, chest wall 07/24/2020  . Exudative age-related macular degeneration of right eye with inactive  choroidal neovascularization ([place]) 05/10/2020  . Exudative age-related macular degeneration of left eye with inactive choroidal neovascularization ([place]) 05/10/2020  . Retinal hemorrhage of right eye 05/10/2020  . Exposure keratopathy, bilateral 05/10/2020  . Bilateral dry eyes 05/10/2020  . Advanced nonexudative age-related macular degeneration of both eyes with subfoveal involvement 05/10/2020  . Sick sinus syndrome ([place]) 12/06/2018  . Complete heart block ([place]) 12/04/2018  . Chronic low back pain 12/15/2017  . Hereditary and idiopathic peripheral neuropathy 07/01/2016  . Essential tremor 02/28/2016  . BPPV (benign paroxysmal positional vertigo) 06/19/2015  . Dizziness 06/18/2015  . Bacteria in urine 06/18/2015  . CKD (chronic kidney disease) stage 3, GFR 30-59 ml/min: Probable 06/18/2015  . Chronic diastolic CHF (congestive heart failure) ([place]) 05/30/2015  . Fall   . Syncope 05/19/2015  . UTI (lower urinary tract infection) 05/19/2015  . Hypotension 05/19/2015  . Dehydration 05/19/2015  . Fracture of rib of left side 05/19/2015  . Syncope and collapse 05/19/2015  . Mobitz type II atrioventricular block 09/13/2014  . Essential hypertension 09/13/2014  . Chronic anticoagulation 09/13/2014  . Tachycardia-bradycardia syndrome ([place]) 09/13/2014  . Encounter for therapeutic drug monitoring 02/09/2014  . Paroxysmal atrial fibrillation ([place]) 10/28/2013  . Thrombocytopenia ([place]) 04/06/2013    Past Surgical History:  Procedure Laterality Date  . APPENDECTOMY    .  EYE SURGERY    . HERNIA REPAIR    . HERNIA REPAIR    . INSERT / REPLACE / REMOVE PACEMAKER  12/04/2018  . IRRIGATION AND DEBRIDEMENT ABSCESS Right 07/24/2020   Procedure: IRRIGATION AND DEBRIDEMENT HEMATOMA;  Surgeon: Coralie Keens, MD;  Location: [facility];  Service: General;  Laterality: Right;  . MASS EXCISION Right 07/20/2020   Procedure: EXCISION OF RIGHT CHEST WALL MASS;  Surgeon: Stark Klein, MD;  Location: [facility];   Service: General;  Laterality: Right;  . PACEMAKER IMPLANT N/A 12/04/2018   Procedure: PACEMAKER IMPLANT;  Surgeon: Evans Lance, MD;  Location: [facility];  Service: Cardiovascular;  Laterality: N/A;       Family History  Problem Relation Age of Onset  . GI problems Mother   . Other Sister        PAIN ISSUES  . Hearing loss Sister   . Blindness Sister   . Heart attack Neg Hx   . Stroke Neg Hx     Social History   Tobacco Use  . Smoking status: Former Smoker    Quit date: 12/30/1962    Years since quitting: 57.6  . Smokeless tobacco: Never Used  Vaping Use  . Vaping Use: Never used  Substance Use Topics  . Alcohol use: No  . Drug use: No    Home Medications Prior to Admission medications   Medication Sig Start Date End Date Taking? Authorizing Provider  acetaminophen (TYLENOL) 500 MG tablet Take 1,000 mg by mouth every 6 (six) hours as needed for mild pain.    [provider]  beta carotene w/minerals (OCUVITE) tablet Take 1 tablet by mouth daily.     [provider]  DULoxetine (CYMBALTA) 20 MG capsule Take 20 mg by mouth 2 (two) times daily. 2nd dose at lunch    [provider]  furosemide (LASIX) 20 MG tablet Take 60 mg by mouth daily. 11/27/19   [provider]  levothyroxine (SYNTHROID) 25 MCG tablet TAKE 1 TABLET (25 MCG TOTAL) BY MOUTH DAILY BEFORE BREAKFAST. 05/17/19   Belva Crome, MD  losartan (COZAAR) 50 MG tablet Take 50 mg by mouth daily. 08/18/17   [provider]  oxyCODONE (OXY IR/ROXICODONE) 5 MG immediate release tablet Take 0.5-1 tablets (2.5-5 mg total) by mouth every 6 (six) hours as needed for severe pain. 07/20/20   Stark Klein, MD  pregabalin (LYRICA) 25 MG capsule TAKE 1 CAPSULE(25 MG) BY MOUTH AT BEDTIME Patient taking differently: Take 25 mg by mouth at bedtime.  03/14/20   Suzzanne Cloud, NP  primidone (MYSOLINE) 250 MG tablet Take 250 mg by mouth 2 (two) times daily. 2nd dose at lunch 08/18/15    [provider]  Tamsulosin HCl (FLOMAX) 0.4 MG CAPS Take 0.4 mg by mouth every evening.     [provider]  topiramate (TOPAMAX) 50 MG tablet Take 1 tablet (50 mg total) by mouth 2 (two) times daily. 08/14/20   Suzzanne Cloud, NP  traMADol (ULTRAM) 50 MG tablet Take 1 tablet (50 mg total) by mouth every 6 (six) hours as needed for severe pain. 07/28/20   Maczis, Barth Kirks, PA-C  vitamin B-12 (CYANOCOBALAMIN) 1000 MCG tablet Take 1,000 mcg by mouth every other day. Lunch    [provider]    Allergies    Sulfa antibiotics  Review of Systems   Review of Systems  Constitutional: Negative for chills, diaphoresis, fatigue and fever.  HENT: Negative for congestion.  Eyes: Negative for visual disturbance.  Respiratory: Negative for cough, chest tightness, shortness of breath and wheezing.   Gastrointestinal: Negative for abdominal pain, constipation, diarrhea, nausea and rectal pain.  Genitourinary: Negative for dysuria and flank pain.  Musculoskeletal: Negative for back pain, neck pain, neck stiffness and stiffness.  Skin: Positive for wound (on R lateral chest wall). Negative for rash.  Neurological: Positive for light-headedness (resolved). Negative for dizziness, syncope, weakness, numbness and headaches.  Psychiatric/Behavioral: Negative for agitation and confusion.  All other systems reviewed and are negative.   Physical Exam Updated Vital Signs BP 131/72 (BP Location: Left Arm)   Pulse 70   Temp 98.5 F (36.9 C) (Oral)   Resp 10   Ht 5' 10.5" (1.791 m)   Wt 91.6 kg   SpO2 96%   BMI 28.57 kg/m   Physical Exam Vitals and nursing note reviewed.  Constitutional:      General: He is not in acute distress.    Appearance: Normal appearance. He is well-developed. He is not ill-appearing, toxic-appearing or diaphoretic.  HENT:     Head: Normocephalic and atraumatic.     Nose: Nose normal. No congestion or rhinorrhea.     Mouth/Throat:     Mouth:  Mucous membranes are moist.     Pharynx: No oropharyngeal exudate or posterior oropharyngeal erythema.  Eyes:     Conjunctiva/sclera: Conjunctivae normal.     Pupils: Pupils are equal, round, and reactive to light.  Cardiovascular:     Rate and Rhythm: Normal rate and regular rhythm.     Pulses: Normal pulses.     Heart sounds: No murmur heard.   Pulmonary:     Effort: Pulmonary effort is normal. No respiratory distress.     Breath sounds: Normal breath sounds. No wheezing, rhonchi or rales.  Chest:     Chest wall: No tenderness.  Abdominal:     General: Abdomen is flat.     Palpations: Abdomen is soft.     Tenderness: There is no abdominal tenderness. There is no right CVA tenderness, left CVA tenderness, guarding or rebound.  Musculoskeletal:        General: No tenderness.     Cervical back: Neck supple. No tenderness.     Right lower leg: No edema.     Left lower leg: No edema.  Skin:    General: Skin is warm and dry.     Capillary Refill: Capillary refill takes less than 2 seconds.     Findings: No erythema or rash.  Neurological:     General: No focal deficit present.     Mental Status: He is alert.  Psychiatric:        Mood and Affect: Mood normal.     ED Results / Procedures / Treatments   Labs (all labs ordered are listed, but only abnormal results are displayed) Labs Reviewed  CBC WITH DIFFERENTIAL/PLATELET - Abnormal; Notable for the following components:      Result Value   WBC 10.8 (*)    RBC 3.09 (*)    Hemoglobin 9.8 (*)    HCT 30.8 (*)    RDW 16.7 (*)    Platelets 140 (*)    Neutro Abs 8.5 (*)    Monocytes Absolute 1.2 (*)    Abs Immature Granulocytes 0.10 (*)    All other components within normal limits  COMPREHENSIVE METABOLIC PANEL - Abnormal; Notable for the following components:   Glucose, Bld 102 (*)    BUN 24 (*)  Creatinine, Ser 1.53 (*)    Alkaline Phosphatase 154 (*)    GFR calc non Af Amer 39 (*)    GFR calc Af Amer 45 (*)    All  other components within normal limits  URINALYSIS, ROUTINE W REFLEX MICROSCOPIC - Abnormal; Notable for the following components:   APPearance CLOUDY (*)    Hgb urine dipstick SMALL (*)    Protein, ur 30 (*)    Leukocytes,Ua LARGE (*)    WBC, UA >50 (*)    Bacteria, UA MANY (*)    All other components within normal limits  BRAIN NATRIURETIC PEPTIDE - Abnormal; Notable for the following components:   B Natriuretic Peptide 341.4 (*)    All other components within normal limits  URINE CULTURE  TSH  PROTIME-INR    EKG None  EKG was paced with no STEMI. ED ECG REPORT   Date: 08/24/2020  Rate: 70  Rhythm: PAced  QRS Axis: normal  Intervals: normal  ST/T Wave abnormalities: normal  Conduction Disutrbances:paced  Narrative Interpretation:   Old EKG Reviewed: unchanged  I have personally reviewed the EKG tracing and agree with the computerized printout as noted.   Radiology DG Chest 2 View  Result Date: 08/24/2020 CLINICAL DATA:  Severe fatigue.  Weakness.  Fall today. EXAM: CHEST - 2 VIEW COMPARISON:  CT chest 05/17/2020 FINDINGS: Left chest wall pacer device is noted with leads in the right atrial appendage and right ventricle. The heart size is enlarged. Aortic atherosclerosis. No pleural effusion. Pulmonary vascular congestion is noted. No airspace consolidation. IMPRESSION: Cardiac enlargement and pulmonary vascular congestion. Electronically Signed   By: Kerby Moors M.D.   On: 08/24/2020 12:08   DG Knee Complete 4 Views Right  Result Date: 08/24/2020 CLINICAL DATA:  Pain following fall EXAM: RIGHT KNEE - COMPLETE 4+ VIEW COMPARISON:  None. FINDINGS: Frontal, lateral, and bilateral oblique views were obtained. There is no fracture or dislocation. No joint effusion. There is moderately severe joint space medially. There is mild narrowing of the patellofemoral joint. There is chondrocalcinosis. Calcification noted in the suprapatellar bursa. There is arterial vascular  calcification in the distal superficial femoral and popliteal arteries as well as in the proximal trifurcation arteries. IMPRESSION: Osteoarthritic change, most notably medially. There is chondrocalcinosis which may be seen with osteoarthritis or calcium pyrophosphate deposition disease. Calcification in the suprapatellar bursa may have arthropathic or posttraumatic etiology. No acute fracture or dislocation. No joint effusion. There is arterial vascular calcification at multiple sites. Electronically Signed   By: Lowella Grip III M.D.   On: 08/24/2020 10:20    Procedures Procedures (including critical care time)  Medications Ordered in ED Medications  cephALEXin (KEFLEX) capsule 500 mg (has no administration in time range)    ED Course  I have reviewed the triage vital signs and the nursing notes.  Pertinent labs & imaging results that were available during my care of the patient were reviewed by me and considered in my medical decision making (see chart for details).    MDM Rules/Calculators/A&P                          Douglas Walker is a 86 y.o. male with a complicated past medical history including paroxysmal atrial fibrillation, hypertension, CHF, BPPV, CKD, sick sinus syndrome and complete heart block status post pacemaker, chronic neuropathies, skin cancer, TIA, and recurrent nosebleeds who presents with right knee pain after falling to the ground.  Patient  is brought by EMS.  According to patient, he was blowing his nose this morning for chronic and frequent nosebleeds when he got lightheaded.  He reports he did not fall to the ground but he describes he gently went to the ground but bumped his right knee on the ground as he was sitting down.  He reports the pain was mild to moderate and is now resolved.  He reports no discomfort at this time.  He reports his family wanted him evaluated for right knee injury but he denies any complaints.  He reports that he has not had any  recent fevers, chills, chest pain, shortness of breath, nausea, vomiting, urinary symptoms, or GI symptoms.  He denies any leg pain at this time.  He does report that he had a right chest wall mass and subsequent hematoma resected last month but is denying any complaints from this and he denies any evidence of infection with no right chest wall pain, drainage, fevers, chills, or erythema.  He denies other complaints.  He reports he did not want to come to the emergency department.  On exam, lungs are clear and chest is nontender.  Wound was examined on the right chest wall and there is a 1 to 2 cm wound that is not having any purulent drainage, not foul-smelling, and has no tenderness or erythema.  Breath sounds were symmetric.  Chest and abdomen are nontender.  Patient had normal range of motion and strength in both knees.  Good pulses, sensation, strength in legs.  He reports chronic neuropathies that are at their baseline.  He denies any knee pain with knee manipulation.  Had a shared conversation with patient.  As he reports he is having no other symptoms and only had some mild knee pain, he does not want other work-up aside from getting an x-ray of the right knee.  He did not pass out or have any chest pain or palpitations or other preceding symptoms.  Given his complaints, we will get a x-ray of the knee but will hold on other lab work or work-up at this time.  Patient's vital signs are completely reassuring he is afebrile, not tachycardic, not bradycardic, not tachypneic, and has normal oxygen saturations.    Suspect mild soft tissue injury to the right knee from the gentle fall to the ground.  Unless he complains of other symptoms, anticipate discharge after imaging.  11:03 AM Patient's x-ray showed degenerative changes but no acute injuries or fractures or dislocations discovered.  I want to tell the patient these encouraging results and then patient was asking why he was feeling lightheaded and  fatigued and tired.  This is contradictory to the conversation we had at the initiation of his work-up.  Patient now would like to be worked up to make sure there are no significant abnormalities or changes causing his more fatigue recently leading to his tumble to the ground today.  We will get work-up to look for occult infection such as urinalysis and chest x-ray, labs to look for abnormalities with his TSH or electrolytes, and check his INR with his frequent nosebleeds.  As he did not hit his head, we will still hold on head imaging at this time.  We will reassess the patient after work-up.  His vital signs are completely reassuring on my reassessment prior to ordering all these labs.  1:38 PM Patient's work-up returned and it does appear as a UTI with leukocytes bacteria and no evidence of contamination.  I  asked the patient and he is having urinary frequency for the last few days.  I suspect is contributing to his lightheaded episode.  Patient otherwise had reassuring work-up similar to prior.  His BNP is elevated but I do not have a comparison.  He does not have any shortness of breath and his legs do not seem extremely edematous to me.  Lungs were clear with no evidence of pneumonia.  Some vascular congestion was seen but he is not hypoxic or tachycardic or hypotensive.    Patient was given a dose of Keflex will be discharged home to treat UTI as an outpatient and follow-up with his PCP.  He will get Keflex 4 times a day for the next week as this is a complicated UTI as he is a male.  His imaging was reassuring for no evidence of acute knee injury and he no longer has any pain at this time.  Patient be discharged home to follow-up with PCP and understands good return precautions.  He had no other questions or concerns and was discharged in good condition.   Final Clinical Impression(s) / ED Diagnoses Final diagnoses:  Fall, initial encounter  Acute pain of right knee  Acute cystitis without  hematuria  Urinary frequency    Rx / DC Orders ED Discharge Orders         Ordered    cephALEXin (KEFLEX) 500 MG capsule  4 times daily        08/24/20 1343         Clinical Impression: 1. Fall, initial encounter   2. Acute pain of right knee   3. Acute cystitis without hematuria   4. Urinary frequency     Disposition: Discharge  Condition: Good  I have discussed the results, Dx and Tx plan with the pt(& family if present). He/she/they expressed understanding and agree(s) with the plan. Discharge instructions discussed at great length. Strict return precautions discussed and pt &/or family have verbalized understanding of the instructions. No further questions at time of discharge.    New Prescriptions   CEPHALEXIN (KEFLEX) 500 MG CAPSULE    Take 1 capsule (500 mg total) by mouth 4 (four) times daily for 7 days.    Follow Up: Josetta Huddle, MD [address]  [place] [address] [place] DEPT [place] [id number] mc [address] [place]       Ashtynn Berke, Gwenyth Allegra, MD 08/24/20 [phone]

## 2020-08-24 NOTE — Discharge Instructions (Signed)
Your traumatic work-up today did not show evidence of acute injury and your x-ray of your knee showed no fracture, dislocation, or other injury.  I suspect mild musculoskeletal and soft tissue injury that has resolved on reassessment.  We also did work-up for the lightheadedness that caused you to go to the ground today and it does appear you have a urinary tract infection.  Other work-up was similar or improved from prior.  Given your stable vital signs and otherwise well appearance, we will treat you for UTI and allow you to follow-up with your primary doctor.  If any symptoms change or worsen, please return to the nearest emergency department.

## 2020-08-24 NOTE — ED Notes (Signed)
[name], son-[phone]-please call if patient is admitted or discharged

## 2020-08-24 NOTE — ED Notes (Signed)
Discharge paperwork discussed with patient  Awaiting son Mr Giangrande to arrive

## 2020-08-24 NOTE — ED Notes (Signed)
Pt to xray

## 2020-08-24 NOTE — ED Notes (Signed)
Updated son regarding plan of care

## 2020-08-24 NOTE — ED Triage Notes (Signed)
Patient arriving from home Blood around nose, family states this is normal  Patient got weak in the knees and fell. No LOC, no obvious deformities/ injuries  Right knee pain is only complaint  Patient did not want to come to emergency room, family made him to get his knee checked out

## 2020-08-24 NOTE — ED Notes (Signed)
Provider at bedside

## 2020-08-26 LAB — URINE CULTURE: Culture: >=100000 — AB

## 2020-08-27 ENCOUNTER — Telehealth: Payer: Self-pay | Admitting: Emergency Medicine

## 2020-08-27 NOTE — Progress Notes (Signed)
ED Antimicrobial Stewardship Positive Culture Follow Up   Douglas Walker is an 86 y.o. male who presented to [facility] on 08/24/2020 with a chief complaint of  Chief Complaint  Patient presents with  . Knee Pain    Right    Recent Results (from the past 720 hour(s))  Urine culture     Status: Abnormal   Collection Time: 08/24/20 11:23 AM   Specimen: Urine, Clean Catch  Result Value Ref Range Status   Specimen Description   Final    URINE, CLEAN CATCH Performed at [facility], [place] [address], [place], [place] [address]    Special Requests   Final    NONE Performed at [facility], [place] [address] [place], [place] [address]    Culture (A)  Final    >=100,000 COLONIES/mL ESCHERICHIA COLI Confirmed Extended Spectrum Beta-Lactamase Producer (ESBL).  In bloodstream infections from ESBL organisms, carbapenems are preferred over piperacillin/tazobactam. They are shown to have a lower risk of mortality.    Report Status 08/26/2020 FINAL  Final   Organism ID, Bacteria ESCHERICHIA COLI (A)  Final      Susceptibility   Escherichia coli - MIC*    AMPICILLIN >=32 RESISTANT Resistant     CEFAZOLIN >=64 RESISTANT Resistant     CEFTRIAXONE >=64 RESISTANT Resistant     CIPROFLOXACIN >=4 RESISTANT Resistant     GENTAMICIN <=1 SENSITIVE Sensitive     IMIPENEM <=0.25 SENSITIVE Sensitive     NITROFURANTOIN <=16 SENSITIVE Sensitive     TRIMETH/SULFA <=20 SENSITIVE Sensitive     AMPICILLIN/SULBACTAM 4 SENSITIVE Sensitive     PIP/TAZO <=4 SENSITIVE Sensitive     * >=100,000 COLONIES/mL ESCHERICHIA COLI    [x]  Treated with Keflex, organism resistant to prescribed antimicrobial []  Patient discharged originally without antimicrobial agent and treatment is now indicated  New antibiotic prescription: Fosfomycin 3gm q3 days x 2 doses  ED Provider: Coral Ceo, PA  Thank you,  Minda Ditto 08/27/2020, 10:32 AM Clinical Pharmacist [phone]

## 2020-08-27 NOTE — Telephone Encounter (Signed)
Post ED Visit - Positive Culture Follow-up: Successful Patient Follow-Up  Culture assessed and recommendations reviewed by:  []  Elenor Quinones, Pharm.D. []  Heide Guile, Pharm.D., BCPS AQ-ID []  Parks Neptune, Pharm.D., BCPS []  Alycia Rossetti, Pharm.D., BCPS []  [place], [place].D., BCPS, AAHIVP []  Legrand Como, Pharm.D., BCPS, AAHIVP []  Salome Arnt, PharmD, BCPS []  Johnnette Gourd, PharmD, BCPS []  Hughes Better, PharmD, BCPS [x]  Graylin Shiver, PharmD  Positive urine culture  []  Patient discharged without antimicrobial prescription and treatment is now indicated [x]  Organism is resistant to prescribed ED discharge antimicrobial []  Patient with positive blood cultures  Changes discussed with ED provider: Coral Ceo PA New antibiotic prescription: Fosfomycin 3 g  q 3 days x two doses Called to [facility] [phone])  Contacted patient, date 08/27/20, time [place] 08/27/2020, 4:30 PM

## 2020-08-29 ENCOUNTER — Encounter: Payer: Self-pay | Admitting: Podiatry

## 2020-08-29 ENCOUNTER — Ambulatory Visit (INDEPENDENT_AMBULATORY_CARE_PROVIDER_SITE_OTHER): Payer: Medicare Other | Admitting: Podiatry

## 2020-08-29 ENCOUNTER — Other Ambulatory Visit: Payer: Self-pay

## 2020-08-29 DIAGNOSIS — B351 Tinea unguium: Secondary | ICD-10-CM | POA: Diagnosis not present

## 2020-08-29 DIAGNOSIS — D689 Coagulation defect, unspecified: Secondary | ICD-10-CM | POA: Diagnosis not present

## 2020-08-29 DIAGNOSIS — L608 Other nail disorders: Secondary | ICD-10-CM

## 2020-08-29 DIAGNOSIS — M79676 Pain in unspecified toe(s): Secondary | ICD-10-CM

## 2020-08-29 DIAGNOSIS — E1142 Type 2 diabetes mellitus with diabetic polyneuropathy: Secondary | ICD-10-CM

## 2020-08-29 NOTE — Progress Notes (Signed)
This patient returns to my office for at risk foot care.  This patient requires this care by a professional since this patient will be at risk due to having peripheral neuropathy, CKD, Coagulation defect and thrombocytopenia.  This patient is unable to cut nails himself since the patient cannot reach his nails.These nails are painful walking and wearing shoes.  This patient presents for at risk foot care today.  He presents to the office with male caregiver.  He is taking coumadin.  General Appearance  Alert, conversant and in no acute stress.  Vascular  Dorsalis pedis and posterior tibial  pulses are not  palpable secondary to severe swelling. bilaterally.  Capillary return is within normal limits  bilaterally. Temperature is within normal limits  bilaterally.  Neurologic  Senn-Weinstein monofilament wire test within normal limits  bilaterally. Muscle power within normal limits bilaterally.  Nails Thick disfigured discolored nails with subungual debris  from hallux to fifth toes bilaterally. Pincer nails hallux  B/L. No evidence of bacterial infection or drainage bilaterally.  Orthopedic  No limitations of motion  feet .  No crepitus or effusions noted.  No bony pathology or digital deformities noted.  Skin  normotropic skin with no porokeratosis noted bilaterally.  No signs of infections or ulcers noted.     Onychomycosis  Pain in right toes  Pain in left toes  Consent was obtained for treatment procedures.   Mechanical debridement of nails 1-5  bilaterally performed with a nail nipper.  Filed with dremel without incident.    Return office visit  10 weeks                   Told patient to return for periodic foot care and evaluation due to potential at risk complications.   Goldman Birchall DPM  

## 2020-08-30 ENCOUNTER — Ambulatory Visit (INDEPENDENT_AMBULATORY_CARE_PROVIDER_SITE_OTHER): Payer: Medicare Other | Admitting: Internal Medicine

## 2020-08-30 ENCOUNTER — Encounter: Payer: Self-pay | Admitting: Internal Medicine

## 2020-08-30 VITALS — BP 142/64 | HR 79 | Ht 70.5 in | Wt 197.2 lb

## 2020-08-30 DIAGNOSIS — Z950 Presence of cardiac pacemaker: Secondary | ICD-10-CM | POA: Insufficient documentation

## 2020-08-30 DIAGNOSIS — I480 Paroxysmal atrial fibrillation: Secondary | ICD-10-CM | POA: Diagnosis not present

## 2020-08-30 DIAGNOSIS — I5032 Chronic diastolic (congestive) heart failure: Secondary | ICD-10-CM

## 2020-08-30 DIAGNOSIS — I442 Atrioventricular block, complete: Secondary | ICD-10-CM

## 2020-08-30 LAB — CUP PACEART INCLINIC DEVICE CHECK
Battery Remaining Longevity: 5.1
Battery Voltage: 2.93 V
Brady Statistic AP VP Percent: 99.73 %
Brady Statistic AP VS Percent: 0 %
Brady Statistic AS VP Percent: 0 %
Brady Statistic AS VS Percent: 0.27 %
Brady Statistic RA Percent Paced: 99.79 %
Brady Statistic RV Percent Paced: 99.73 %
Date Time Interrogation Session: 20210901155801
Implantable Lead Implant Date: 20191206
Implantable Lead Implant Date: 20191206
Implantable Lead Location: 753860
Implantable Lead Location: 753860
Implantable Lead Model: 3830
Implantable Lead Model: 5076
Implantable Pulse Generator Implant Date: 20191206
Lead Channel Impedance Value: 304 Ohm
Lead Channel Impedance Value: 342 Ohm
Lead Channel Impedance Value: 399 Ohm
Lead Channel Impedance Value: 418 Ohm
Lead Channel Pacing Threshold Amplitude: 0.75 V
Lead Channel Pacing Threshold Amplitude: 1 V
Lead Channel Pacing Threshold Amplitude: 1 V
Lead Channel Pacing Threshold Pulse Width: 0.4 ms
Lead Channel Pacing Threshold Pulse Width: 0.6 ms
Lead Channel Pacing Threshold Pulse Width: 1 ms
Lead Channel Sensing Intrinsic Amplitude: 2.875 mV
Lead Channel Sensing Intrinsic Amplitude: 4.875 mV
Lead Channel Setting Pacing Amplitude: 2 V
Lead Channel Setting Pacing Amplitude: 2.5 V
Lead Channel Setting Pacing Pulse Width: 0.4 ms
Lead Channel Setting Sensing Sensitivity: 1.2 mV

## 2020-08-30 NOTE — Patient Instructions (Signed)
Medication Instructions:  Your physician recommends that you continue on your current medications as directed. Please refer to the Current Medication list given to you today.  Labwork: None ordered.  Testing/Procedures: None ordered.  Follow-Up: Your physician wants you to follow-up in: one year with Dr. Lovena Le.   You will receive a reminder letter in the mail two months in advance. If you don't receive a letter, please call our office to schedule the follow-up appointment.  Remote monitoring is used to monitor your Pacemaker from home. This monitoring reduces the number of office visits required to check your device to one time per year. It allows [place] to keep an eye on the functioning of your device to ensure it is working properly. You are scheduled for a device check from home on 10/06/2020. You may send your transmission at any time that day. If you have a wireless device, the transmission will be sent automatically. After your physician reviews your transmission, you will receive a postcard with your next transmission date.  Any Other Special Instructions Will Be Listed Below (If Applicable).  If you need a refill on your cardiac medications before your next appointment, please call your pharmacy.

## 2020-08-30 NOTE — Progress Notes (Signed)
HPI Mr. Finken returns today for followup. He has been in the ER with multiple medical problems including atrial fib, chronic diastolic heart failure, right chest wall mass, s/p removal complicated by hematoma. He has a h/o sinus node dysfunction and s/p PPM insertion. He has not had syncope but has fallen and injured his back. He is limited in his activity by severe peripheral neuropathy. Allergies  Allergen Reactions  . Sulfa Antibiotics Other (See Comments)    Weakness, dizziness     Current Outpatient Medications  Medication Sig Dispense Refill  . acetaminophen (TYLENOL) 500 MG tablet Take 1,000 mg by mouth every 6 (six) hours as needed for mild pain.    Marland Kitchen aspirin EC 81 MG tablet Take 81 mg by mouth daily. Swallow whole.    . beta carotene w/minerals (OCUVITE) tablet Take 1 tablet by mouth daily.     . diclofenac Sodium (VOLTAREN) 1 % GEL Apply 2 g topically 4 (four) times daily as needed (pain).    . DULoxetine (CYMBALTA) 20 MG capsule Take 20 mg by mouth 2 (two) times daily. 2nd dose at lunch    . finasteride (PROSCAR) 5 MG tablet Take 5 mg by mouth daily.    . furosemide (LASIX) 20 MG tablet Take 40 mg by mouth 2 (two) times daily.     Marland Kitchen levothyroxine (SYNTHROID) 25 MCG tablet TAKE 1 TABLET (25 MCG TOTAL) BY MOUTH DAILY BEFORE BREAKFAST. 90 tablet 1  . losartan (COZAAR) 50 MG tablet Take 50 mg by mouth daily.  1  . nitrofurantoin, macrocrystal-monohydrate, (MACROBID) 100 MG capsule Take 100 mg by mouth 2 (two) times daily.    Marland Kitchen oxyCODONE (OXY IR/ROXICODONE) 5 MG immediate release tablet Take 0.5-1 tablets (2.5-5 mg total) by mouth every 6 (six) hours as needed for severe pain. 10 tablet 0  . pregabalin (LYRICA) 25 MG capsule TAKE 1 CAPSULE(25 MG) BY MOUTH AT BEDTIME (Patient taking differently: Take 25 mg by mouth at bedtime. ) 90 capsule 1  . primidone (MYSOLINE) 250 MG tablet Take 250 mg by mouth 2 (two) times daily. 2nd dose at lunch  11  . Tamsulosin HCl (FLOMAX) 0.4 MG  CAPS Take 0.4 mg by mouth every evening.     . topiramate (TOPAMAX) 50 MG tablet Take 1 tablet (50 mg total) by mouth 2 (two) times daily. 180 tablet 1  . traMADol (ULTRAM) 50 MG tablet Take 1 tablet (50 mg total) by mouth every 6 (six) hours as needed for severe pain. 20 tablet 0  . vitamin B-12 (CYANOCOBALAMIN) 1000 MCG tablet Take 1,000 mcg by mouth every other day. Lunch     No current facility-administered medications for this visit.     Past Medical History:  Diagnosis Date  . Arthritis    shoulders and back  . Cancer ([place])    skin cancers  . Chronic low back pain 12/15/2017  . CKD (chronic kidney disease) stage 3, GFR 30-59 ml/min   . Coronary artery disease   . Dysrhythmia     Paroxysmal atrial fibrillation; Tachycardia-bradycardia syndrome  . Essential tremor 02/28/2016  . GERD (gastroesophageal reflux disease)   . Hernia   . History of hiatal hernia   . Hyperlipemia   . Hypertension   . Macular degeneration   . Neuromuscular disorder (HCC)    neuropathy  . Paroxysmal atrial fibrillation ([place]) 10/28/2013  . Presence of permanent cardiac pacemaker 12/04/2018  . Tachycardia-bradycardia syndrome ([place]) 09/13/2014  . TIA (transient ischemic attack)  ROS:   All systems reviewed and negative except as noted in the HPI.   Past Surgical History:  Procedure Laterality Date  . APPENDECTOMY    . EYE SURGERY    . HERNIA REPAIR    . HERNIA REPAIR    . INSERT / REPLACE / REMOVE PACEMAKER  12/04/2018  . IRRIGATION AND DEBRIDEMENT ABSCESS Right 07/24/2020   Procedure: IRRIGATION AND DEBRIDEMENT HEMATOMA;  Surgeon: Coralie Keens, MD;  Location: [facility];  Service: General;  Laterality: Right;  . MASS EXCISION Right 07/20/2020   Procedure: EXCISION OF RIGHT CHEST WALL MASS;  Surgeon: Stark Klein, MD;  Location: [facility];  Service: General;  Laterality: Right;  . PACEMAKER IMPLANT N/A 12/04/2018   Procedure: PACEMAKER IMPLANT;  Surgeon: Evans Lance, MD;  Location: [facility];  Service: Cardiovascular;  Laterality: N/A;     Family History  Problem Relation Age of Onset  . GI problems Mother   . Other Sister        PAIN ISSUES  . Hearing loss Sister   . Blindness Sister   . Heart attack Neg Hx   . Stroke Neg Hx      Social History   Socioeconomic History  . Marital status: Widowed    Spouse name: Not on file  . Number of children: 1  . Years of education: 28  . Highest education level: Not on file  Occupational History    Comment: retired  Tobacco Use  . Smoking status: Former Smoker    Quit date: 12/30/1962    Years since quitting: 57.7  . Smokeless tobacco: Never Used  Vaping Use  . Vaping Use: Never used  Substance and Sexual Activity  . Alcohol use: No  . Drug use: No  . Sexual activity: Not Currently  Other Topics Concern  . Not on file  Social History Narrative   Lives alone   Right-handed   Caffeine use- caffeine free coffee, occas soda   Social Determinants of Health   Financial Resource Strain:   . Difficulty of Paying Living Expenses: Not on file  Food Insecurity:   . Worried About Charity fundraiser in the Last Year: Not on file  . Ran Out of Food in the Last Year: Not on file  Transportation Needs:   . Lack of Transportation (Medical): Not on file  . Lack of Transportation (Non-Medical): Not on file  Physical Activity:   . Days of Exercise per Week: Not on file  . Minutes of Exercise per Session: Not on file  Stress:   . Feeling of Stress : Not on file  Social Connections:   . Frequency of Communication with Friends and Family: Not on file  . Frequency of Social Gatherings with Friends and Family: Not on file  . Attends Religious Services: Not on file  . Active Member of Clubs or Organizations: Not on file  . Attends Archivist Meetings: Not on file  . Marital Status: Not on file  Intimate Partner Violence:   . Fear of Current or Ex-Partner: Not on file  . Emotionally Abused: Not on  file  . Physically Abused: Not on file  . Sexually Abused: Not on file     BP (!) 142/64   Pulse 79   Ht 5' 10.5" (1.791 m)   Wt 197 lb 3.2 oz (89.4 kg)   SpO2 92%   BMI 27.90 kg/m   Physical Exam:  frail appearing NAD HEENT: Unremarkable Neck:  No JVD, no thyromegally Lymphatics:  No adenopathy Back:  No CVA tenderness Lungs:  Clear with no wheezes HEART:  Regular rate rhythm, no murmurs, no rubs, no clicks Abd:  soft, positive bowel sounds, no organomegally, no rebound, no guarding Ext:  2 plus pulses, no edema, no cyanosis, no clubbing Skin:  No rashes no nodules Neuro:  CN II through XII intact, motor grossly intact   DEVICE  Normal device function.  See PaceArt for details.   Assess/Plan: 1. Atrial fib - his rates are controlled. No change in his meds. 2. PPM -his medtronic DDD PM is working normally. 3. Falls - he is not a candidate for coumadin therapy. 4. HTN - his SBP is up a bit but with his h/o falls we do not want to aggressively try to lower too much.  Salome Spotted.

## 2020-08-31 DIAGNOSIS — I959 Hypotension, unspecified: Secondary | ICD-10-CM | POA: Diagnosis not present

## 2020-08-31 DIAGNOSIS — I5032 Chronic diastolic (congestive) heart failure: Secondary | ICD-10-CM | POA: Diagnosis not present

## 2020-08-31 DIAGNOSIS — S41102A Unspecified open wound of left upper arm, initial encounter: Secondary | ICD-10-CM | POA: Diagnosis not present

## 2020-08-31 DIAGNOSIS — R609 Edema, unspecified: Secondary | ICD-10-CM | POA: Diagnosis not present

## 2020-08-31 DIAGNOSIS — W19XXXA Unspecified fall, initial encounter: Secondary | ICD-10-CM | POA: Diagnosis not present

## 2020-08-31 DIAGNOSIS — A4151 Sepsis due to Escherichia coli [E. coli]: Secondary | ICD-10-CM | POA: Diagnosis not present

## 2020-09-16 DIAGNOSIS — L7632 Postprocedural hematoma of skin and subcutaneous tissue following other procedure: Secondary | ICD-10-CM | POA: Diagnosis not present

## 2020-09-26 DIAGNOSIS — I10 Essential (primary) hypertension: Secondary | ICD-10-CM | POA: Diagnosis not present

## 2020-09-26 DIAGNOSIS — I2510 Atherosclerotic heart disease of native coronary artery without angina pectoris: Secondary | ICD-10-CM | POA: Diagnosis not present

## 2020-09-26 DIAGNOSIS — I5030 Unspecified diastolic (congestive) heart failure: Secondary | ICD-10-CM | POA: Diagnosis not present

## 2020-09-26 DIAGNOSIS — I480 Paroxysmal atrial fibrillation: Secondary | ICD-10-CM | POA: Diagnosis not present

## 2020-09-26 DIAGNOSIS — I4891 Unspecified atrial fibrillation: Secondary | ICD-10-CM | POA: Diagnosis not present

## 2020-09-26 DIAGNOSIS — E039 Hypothyroidism, unspecified: Secondary | ICD-10-CM | POA: Diagnosis not present

## 2020-09-26 DIAGNOSIS — E782 Mixed hyperlipidemia: Secondary | ICD-10-CM | POA: Diagnosis not present

## 2020-09-26 DIAGNOSIS — F329 Major depressive disorder, single episode, unspecified: Secondary | ICD-10-CM | POA: Diagnosis not present

## 2020-09-26 DIAGNOSIS — I5032 Chronic diastolic (congestive) heart failure: Secondary | ICD-10-CM | POA: Diagnosis not present

## 2020-09-26 DIAGNOSIS — F322 Major depressive disorder, single episode, severe without psychotic features: Secondary | ICD-10-CM | POA: Diagnosis not present

## 2020-09-26 DIAGNOSIS — I130 Hypertensive heart and chronic kidney disease with heart failure and stage 1 through stage 4 chronic kidney disease, or unspecified chronic kidney disease: Secondary | ICD-10-CM | POA: Diagnosis not present

## 2020-09-26 DIAGNOSIS — G459 Transient cerebral ischemic attack, unspecified: Secondary | ICD-10-CM | POA: Diagnosis not present

## 2020-09-28 DIAGNOSIS — I5032 Chronic diastolic (congestive) heart failure: Secondary | ICD-10-CM | POA: Diagnosis not present

## 2020-10-04 DIAGNOSIS — D6869 Other thrombophilia: Secondary | ICD-10-CM | POA: Diagnosis not present

## 2020-10-04 DIAGNOSIS — G609 Hereditary and idiopathic neuropathy, unspecified: Secondary | ICD-10-CM | POA: Diagnosis not present

## 2020-10-04 DIAGNOSIS — I5032 Chronic diastolic (congestive) heart failure: Secondary | ICD-10-CM | POA: Diagnosis not present

## 2020-10-04 DIAGNOSIS — I480 Paroxysmal atrial fibrillation: Secondary | ICD-10-CM | POA: Diagnosis not present

## 2020-10-04 DIAGNOSIS — G250 Essential tremor: Secondary | ICD-10-CM | POA: Diagnosis not present

## 2020-10-04 DIAGNOSIS — Z23 Encounter for immunization: Secondary | ICD-10-CM | POA: Diagnosis not present

## 2020-10-04 DIAGNOSIS — Z0001 Encounter for general adult medical examination with abnormal findings: Secondary | ICD-10-CM | POA: Diagnosis not present

## 2020-10-04 DIAGNOSIS — N390 Urinary tract infection, site not specified: Secondary | ICD-10-CM | POA: Diagnosis not present

## 2020-10-04 DIAGNOSIS — D509 Iron deficiency anemia, unspecified: Secondary | ICD-10-CM | POA: Diagnosis not present

## 2020-10-04 DIAGNOSIS — G629 Polyneuropathy, unspecified: Secondary | ICD-10-CM | POA: Diagnosis not present

## 2020-10-04 DIAGNOSIS — I130 Hypertensive heart and chronic kidney disease with heart failure and stage 1 through stage 4 chronic kidney disease, or unspecified chronic kidney disease: Secondary | ICD-10-CM | POA: Diagnosis not present

## 2020-10-04 DIAGNOSIS — F322 Major depressive disorder, single episode, severe without psychotic features: Secondary | ICD-10-CM | POA: Diagnosis not present

## 2020-10-06 ENCOUNTER — Ambulatory Visit (INDEPENDENT_AMBULATORY_CARE_PROVIDER_SITE_OTHER): Payer: Medicare Other

## 2020-10-06 DIAGNOSIS — I495 Sick sinus syndrome: Secondary | ICD-10-CM

## 2020-10-06 LAB — CUP PACEART REMOTE DEVICE CHECK
Battery Remaining Longevity: 65 mo
Battery Voltage: 2.96 V
Brady Statistic AP VP Percent: 99.93 %
Brady Statistic AP VS Percent: 0 %
Brady Statistic AS VP Percent: 0 %
Brady Statistic AS VS Percent: 0.07 %
Brady Statistic RA Percent Paced: 99.95 %
Brady Statistic RV Percent Paced: 99.93 %
Date Time Interrogation Session: 20211008003729
Implantable Lead Implant Date: 20191206
Implantable Lead Implant Date: 20191206
Implantable Lead Location: 753860
Implantable Lead Location: 753860
Implantable Lead Model: 3830
Implantable Lead Model: 5076
Implantable Pulse Generator Implant Date: 20191206
Lead Channel Impedance Value: 304 Ohm
Lead Channel Impedance Value: 323 Ohm
Lead Channel Impedance Value: 399 Ohm
Lead Channel Impedance Value: 399 Ohm
Lead Channel Sensing Intrinsic Amplitude: 2.875 mV
Lead Channel Sensing Intrinsic Amplitude: 3.75 mV
Lead Channel Sensing Intrinsic Amplitude: 4.25 mV
Lead Channel Sensing Intrinsic Amplitude: 4.875 mV
Lead Channel Setting Pacing Amplitude: 2 V
Lead Channel Setting Pacing Amplitude: 2.5 V
Lead Channel Setting Pacing Pulse Width: 0.4 ms
Lead Channel Setting Sensing Sensitivity: 1.2 mV

## 2020-10-10 NOTE — Progress Notes (Signed)
Remote pacemaker transmission.   

## 2020-10-18 ENCOUNTER — Telehealth: Payer: Self-pay | Admitting: Internal Medicine

## 2020-10-18 NOTE — Telephone Encounter (Signed)
New message: Mariann Laster from [facility] calling concering a order.  Please call back

## 2020-10-18 NOTE — Telephone Encounter (Signed)
Spoke with Mariann Laster from [facility] who states she is in need of updated home health orders from Dr. Lovena Le for this patient.   Mariann Laster states she has faxed the orders to be signed multiple times but as not heard back. Mariann Laster confirmed main fax number and medical records fax number as well. She states she just received confirmation that the fax was delivered. I informed Mariann Laster that I would let Dr. Tanna Furry nurse know that these papers were on the way.

## 2020-10-18 NOTE — Telephone Encounter (Signed)
Returned call to Douglas Walker  Advised Dr. Lovena Le DID NOT order [facility] for this Pt.  Pt fell and had hematoma drained by Dr. Coralie Keens.  Advised to contact their office for orders.

## 2020-10-23 DIAGNOSIS — N390 Urinary tract infection, site not specified: Secondary | ICD-10-CM | POA: Diagnosis not present

## 2020-10-26 ENCOUNTER — Other Ambulatory Visit: Payer: Self-pay | Admitting: *Deleted

## 2020-10-26 DIAGNOSIS — I130 Hypertensive heart and chronic kidney disease with heart failure and stage 1 through stage 4 chronic kidney disease, or unspecified chronic kidney disease: Secondary | ICD-10-CM | POA: Diagnosis not present

## 2020-10-26 DIAGNOSIS — I5032 Chronic diastolic (congestive) heart failure: Secondary | ICD-10-CM | POA: Diagnosis not present

## 2020-10-26 DIAGNOSIS — E039 Hypothyroidism, unspecified: Secondary | ICD-10-CM | POA: Diagnosis not present

## 2020-10-26 DIAGNOSIS — E782 Mixed hyperlipidemia: Secondary | ICD-10-CM | POA: Diagnosis not present

## 2020-10-26 DIAGNOSIS — I2510 Atherosclerotic heart disease of native coronary artery without angina pectoris: Secondary | ICD-10-CM | POA: Diagnosis not present

## 2020-10-26 DIAGNOSIS — I10 Essential (primary) hypertension: Secondary | ICD-10-CM | POA: Diagnosis not present

## 2020-10-26 DIAGNOSIS — N183 Chronic kidney disease, stage 3 unspecified: Secondary | ICD-10-CM | POA: Diagnosis not present

## 2020-10-26 DIAGNOSIS — I480 Paroxysmal atrial fibrillation: Secondary | ICD-10-CM | POA: Diagnosis not present

## 2020-10-26 DIAGNOSIS — F322 Major depressive disorder, single episode, severe without psychotic features: Secondary | ICD-10-CM | POA: Diagnosis not present

## 2020-10-26 DIAGNOSIS — G459 Transient cerebral ischemic attack, unspecified: Secondary | ICD-10-CM | POA: Diagnosis not present

## 2020-10-26 MED ORDER — PREGABALIN 25 MG PO CAPS: ORAL_CAPSULE | ORAL | 1 refills | Status: DC

## 2020-10-27 DIAGNOSIS — I5032 Chronic diastolic (congestive) heart failure: Secondary | ICD-10-CM | POA: Diagnosis not present

## 2020-10-31 ENCOUNTER — Ambulatory Visit: Payer: Self-pay | Admitting: Neurology

## 2020-11-02 ENCOUNTER — Ambulatory Visit (INDEPENDENT_AMBULATORY_CARE_PROVIDER_SITE_OTHER): Payer: Medicare Other | Admitting: Neurology

## 2020-11-02 ENCOUNTER — Encounter: Payer: Self-pay | Admitting: Neurology

## 2020-11-02 VITALS — BP 120/72 | Ht 70.5 in | Wt 197.6 lb

## 2020-11-02 DIAGNOSIS — G250 Essential tremor: Secondary | ICD-10-CM

## 2020-11-02 DIAGNOSIS — G609 Hereditary and idiopathic neuropathy, unspecified: Secondary | ICD-10-CM

## 2020-11-02 MED ORDER — TOPIRAMATE 50 MG PO TABS: 50.0000 mg | ORAL_TABLET | Freq: Two times a day (BID) | ORAL | 3 refills | Status: DC

## 2020-11-02 NOTE — Progress Notes (Signed)
PATIENT: [name] DOB: 12-Oct-1925  REASON FOR VISIT: follow up HISTORY FROM: patient  HISTORY OF PRESENT ILLNESS: Today 11/02/20 Mr. Plessinger is a 86 year old male with history of essential tremor and peripheral neuropathy.  He is on Topamax and Lyrica from this office, prescribed primidone and Cymbalta from his PCP.  He is now living with his son.  He had a mass removed to his right chest in July, had to be drained twice, was ischemic fasciitis.  He went to [facility] for rehab for 3 weeks, then went to live with his son.  He had 1 fall, went to the ER, found to have UTI.  He has a pacemaker.  His essential tremor affects his head, jaw, both upper extremities, right more than left.  No recent falls, using walker.  Neuropathy is most bothersome during the day feels best when tapping his feet, sleeps well at night.  He has routine follow-up with his PCP.  I talked with his son, prefers to continue current medications for now.  He just finished HH PT/OT.  Presents today for evaluation with his son.  HISTORY 08/03/2019 SS: Mr. Carreon is a 86 year old male with history of an essential tremor.  Patient remains on Mysoline, Topamax, and is taking Lyrica for peripheral neuropathy.  He is also on Cymbalta for neuropathy.  He had a pacemaker placed in December 2019. He reports his tremor is the same.  He has tremor to both extremities and his head.  He lives alone but has a Actuary for 12 hours during the day.  On average he sleeps well at night.  He uses a walker.  He has not had any recent falls.  He describes his neuropathy as feeling like he is walking on 2 pegs.  Both of his feet feel numb.  He reports regular follow-up with his primary care doctor.  He seems to tolerate medications well.  He presents today for follow-up accompanied by his son.   REVIEW OF SYSTEMS: Out of a complete 14 system review of symptoms, the patient complains only of the following symptoms, and all other reviewed systems  are negative.  Tremor, numbness  ALLERGIES: Allergies  Allergen Reactions   Sulfa Antibiotics Other (See Comments)    Weakness, dizziness    HOME MEDICATIONS: Outpatient Medications Prior to Visit  Medication Sig Dispense Refill   acetaminophen (TYLENOL) 500 MG tablet Take 1,000 mg by mouth every 6 (six) hours as needed for mild pain.     aspirin EC 81 MG tablet Take 81 mg by mouth daily. Swallow whole.     beta carotene w/minerals (OCUVITE) tablet Take 1 tablet by mouth daily.      diclofenac Sodium (VOLTAREN) 1 % GEL Apply 2 g topically 4 (four) times daily as needed (pain).     DULoxetine (CYMBALTA) 20 MG capsule Take 20 mg by mouth in the morning, at noon, and at bedtime. 2nd dose at lunch     finasteride (PROSCAR) 5 MG tablet Take 5 mg by mouth daily.     furosemide (LASIX) 20 MG tablet Take 60 mg by mouth daily.      levothyroxine (SYNTHROID) 25 MCG tablet TAKE 1 TABLET (25 MCG TOTAL) BY MOUTH DAILY BEFORE BREAKFAST. 90 tablet 1   pregabalin (LYRICA) 25 MG capsule TAKE 1 CAPSULE(25 MG) BY MOUTH AT BEDTIME 90 capsule 1   primidone (MYSOLINE) 250 MG tablet Take 250 mg by mouth 2 (two) times daily. 2nd dose at lunch  11  Tamsulosin HCl (FLOMAX) 0.4 MG CAPS Take 0.4 mg by mouth 2 (two) times daily.      vitamin B-12 (CYANOCOBALAMIN) 1000 MCG tablet Take 1,000 mcg by mouth every other day. Lunch     topiramate (TOPAMAX) 50 MG tablet Take 1 tablet (50 mg total) by mouth 2 (two) times daily. 180 tablet 1   losartan (COZAAR) 50 MG tablet Take 50 mg by mouth daily.  1   nitrofurantoin, macrocrystal-monohydrate, (MACROBID) 100 MG capsule Take 100 mg by mouth 2 (two) times daily.     oxyCODONE (OXY IR/ROXICODONE) 5 MG immediate release tablet Take 0.5-1 tablets (2.5-5 mg total) by mouth every 6 (six) hours as needed for severe pain. 10 tablet 0   traMADol (ULTRAM) 50 MG tablet Take 1 tablet (50 mg total) by mouth every 6 (six) hours as needed for severe pain. 20 tablet 0     No facility-administered medications prior to visit.    PAST MEDICAL HISTORY: Past Medical History:  Diagnosis Date   Arthritis    shoulders and back   Cancer ([place])    skin cancers   Chronic low back pain 12/15/2017   CKD (chronic kidney disease) stage 3, GFR 30-59 ml/min (HCC)    Coronary artery disease    Dysrhythmia     Paroxysmal atrial fibrillation; Tachycardia-bradycardia syndrome   Essential tremor 02/28/2016   GERD (gastroesophageal reflux disease)    Hernia    History of hiatal hernia    Hyperlipemia    Hypertension    Macular degeneration    Neuromuscular disorder (HCC)    neuropathy   Paroxysmal atrial fibrillation ([place]) 10/28/2013   Presence of permanent cardiac pacemaker 12/04/2018   Tachycardia-bradycardia syndrome ([place]) 09/13/2014   TIA (transient ischemic attack)     PAST SURGICAL HISTORY: Past Surgical History:  Procedure Laterality Date   [place] / REPLACE / REMOVE PACEMAKER  12/04/2018   IRRIGATION AND DEBRIDEMENT ABSCESS Right 07/24/2020   Procedure: IRRIGATION AND DEBRIDEMENT HEMATOMA;  Surgeon: Coralie Keens, MD;  Location: [facility];  Service: General;  Laterality: Right;   MASS EXCISION Right 07/20/2020   Procedure: EXCISION OF RIGHT CHEST WALL MASS;  Surgeon: Stark Klein, MD;  Location: [facility];  Service: General;  Laterality: Right;   PACEMAKER IMPLANT N/A 12/04/2018   Procedure: PACEMAKER IMPLANT;  Surgeon: Evans Lance, MD;  Location: [facility];  Service: Cardiovascular;  Laterality: N/A;    FAMILY HISTORY: Family History  Problem Relation Age of Onset   GI problems Mother    Other Sister        PAIN ISSUES   Hearing loss Sister    Blindness Sister    Heart attack Neg Hx    Stroke Neg Hx     SOCIAL HISTORY: Social History   Socioeconomic History   Marital status: Widowed    Spouse name: Not on file   Number of  children: 1   Years of education: 10   Highest education level: Not on file  Occupational History    Comment: retired  Tobacco Use   Smoking status: Former Smoker    Quit date: 12/30/1962    Years since quitting: 57.8   Smokeless tobacco: Never Used  Vaping Use   Vaping Use: Never used  Substance and Sexual Activity   Alcohol use: No   Drug use: No   Sexual  activity: Not Currently  Other Topics Concern   Not on file  Social History Narrative   Lives alone   Right-handed   Caffeine use- caffeine free coffee, occas soda   Social Determinants of Health   Financial Resource Strain:    Difficulty of Paying Living Expenses: Not on file  Food Insecurity:    Worried About New Site in the Last Year: Not on file   Ran Out of Food in the Last Year: Not on file  Transportation Needs:    Lack of Transportation (Medical): Not on file   Lack of Transportation (Non-Medical): Not on file  Physical Activity:    Days of Exercise per Week: Not on file   Minutes of Exercise per Session: Not on file  Stress:    Feeling of Stress : Not on file  Social Connections:    Frequency of Communication with Friends and Family: Not on file   Frequency of Social Gatherings with Friends and Family: Not on file   Attends Religious Services: Not on file   Active Member of Clubs or Organizations: Not on file   Attends Archivist Meetings: Not on file   Marital Status: Not on file  Intimate Partner Violence:    Fear of Current or Ex-Partner: Not on file   Emotionally Abused: Not on file   Physically Abused: Not on file   Sexually Abused: Not on file   PHYSICAL EXAM  Vitals:   11/02/20 1325  BP: 120/72  Weight: 197 lb 9.6 oz (89.6 kg)  Height: 5' 10.5" (1.791 m)   Body mass index is 27.95 kg/m.  Generalized: Well developed, in no acute distress  Neurological examination  Mentation: Alert oriented to time, place, history taking. Follows all  commands speech and language fluent, hard of hearing Cranial nerve II-XII: Pupils were equal round reactive to light. Extraocular movements were full, visual field were full on confrontational test, reported legally blind. Facial sensation and strength were normal.  Head turning and shoulder shrug  were normal and symmetric.  A head and jaw tremor is noted. Motor: The motor testing reveals 5 over 5 strength of all 4 extremities. Good symmetric motor tone is noted throughout.  Sensory: Sensory testing is intact to soft touch on all 4 extremities. No evidence of extinction is noted.  Coordination: Cerebellar testing reveals good finger-nose-finger and heel-to-shin bilaterally.  Moderate tremor with finger-nose-finger. Gait and station: Gait is wide-based, using a walker Reflexes: Deep tendon reflexes are symmetric and normal bilaterally.   DIAGNOSTIC DATA (LABS, IMAGING, TESTING) - I reviewed patient records, labs, notes, testing and imaging myself where available.  Lab Results  Component Value Date   WBC 10.8 (H) 08/24/2020   HGB 9.8 (L) 08/24/2020   HCT 30.8 (L) 08/24/2020   MCV 99.7 08/24/2020   PLT 140 (L) 08/24/2020      Component Value Date/Time   NA 139 08/24/2020 1123   NA 140 01/28/2019 1112   K 4.2 08/24/2020 1123   CL 103 08/24/2020 1123   CO2 26 08/24/2020 1123   GLUCOSE 102 (H) 08/24/2020 1123   BUN 24 (H) 08/24/2020 1123   BUN 35 01/28/2019 1112   CREATININE 1.53 (H) 08/24/2020 1123   CALCIUM 8.9 08/24/2020 1123   PROT 7.2 08/24/2020 1123   ALBUMIN 3.5 08/24/2020 1123   AST 20 08/24/2020 1123   ALT 21 08/24/2020 1123   ALKPHOS 154 (H) 08/24/2020 1123   BILITOT 0.6 08/24/2020 1123   GFRNONAA 39 (  L) 08/24/2020 1123   GFRAA 45 (L) 08/24/2020 1123   No results found for: CHOL, HDL, LDLCALC, LDLDIRECT, TRIG, CHOLHDL No results found for: HGBA1C No results found for: VITAMINB12 Lab Results  Component Value Date   TSH 3.564 08/24/2020      ASSESSMENT AND  PLAN 86 y.o. year old male  has a past medical history of Arthritis, Cancer ([place]), Chronic low back pain (12/15/2017), CKD (chronic kidney disease) stage 3, GFR 30-59 ml/min ([place]), Coronary artery disease, Dysrhythmia, Essential tremor (02/28/2016), GERD (gastroesophageal reflux disease), Hernia, History of hiatal hernia, Hyperlipemia, Hypertension, Macular degeneration, Neuromuscular disorder ([place]), Paroxysmal atrial fibrillation ([place]) (10/28/2013), Presence of permanent cardiac pacemaker (12/04/2018), Tachycardia-bradycardia syndrome ([place]) (09/13/2014), and TIA (transient ischemic attack). here with:  1.  Essential tremor 2.  Peripheral neuropathy  Remains overall stable, he is now living with his son.  We talked about making sure he ambulates safely, using his walker, careful not to fall.  He will remain on Topamax and Lyrica, they do not wish to adjust any medications.  Cymbalta and primidone come from PCP.  At this point, he will continue routine follow-up with PCP, follow-up in our office on an as-needed basis.  I spent 30 minutes of face-to-face and non-face-to-face time with patient.  This included previsit chart review, lab review, study review, order entry, electronic health record documentation, patient education.  Butler Denmark, AGNP-C, DNP 11/02/2020, 2:18 PM [facility] [address] [place], [place] [address] [phone]

## 2020-11-02 NOTE — Patient Instructions (Addendum)
We can continue the current medications Please be careful not to fall  Continue seeing your primary doctor  Follow-up as needed

## 2020-11-03 NOTE — Progress Notes (Signed)
I have read the note, and I agree with the clinical assessment and plan.  Cletus Mehlhoff K Wendel Homeyer   

## 2020-11-10 DIAGNOSIS — Z23 Encounter for immunization: Secondary | ICD-10-CM | POA: Diagnosis not present

## 2020-11-14 DIAGNOSIS — N183 Chronic kidney disease, stage 3 unspecified: Secondary | ICD-10-CM | POA: Diagnosis not present

## 2020-11-14 DIAGNOSIS — K5900 Constipation, unspecified: Secondary | ICD-10-CM | POA: Diagnosis not present

## 2020-11-14 DIAGNOSIS — R319 Hematuria, unspecified: Secondary | ICD-10-CM | POA: Diagnosis not present

## 2020-11-14 DIAGNOSIS — Z8551 Personal history of malignant neoplasm of bladder: Secondary | ICD-10-CM | POA: Diagnosis not present

## 2020-11-15 ENCOUNTER — Ambulatory Visit (INDEPENDENT_AMBULATORY_CARE_PROVIDER_SITE_OTHER): Payer: Medicare Other | Admitting: Podiatry

## 2020-11-15 ENCOUNTER — Other Ambulatory Visit: Payer: Self-pay

## 2020-11-15 ENCOUNTER — Encounter: Payer: Self-pay | Admitting: Podiatry

## 2020-11-15 DIAGNOSIS — B351 Tinea unguium: Secondary | ICD-10-CM | POA: Diagnosis not present

## 2020-11-15 DIAGNOSIS — E1142 Type 2 diabetes mellitus with diabetic polyneuropathy: Secondary | ICD-10-CM | POA: Diagnosis not present

## 2020-11-15 DIAGNOSIS — L608 Other nail disorders: Secondary | ICD-10-CM

## 2020-11-15 DIAGNOSIS — M79676 Pain in unspecified toe(s): Secondary | ICD-10-CM

## 2020-11-15 DIAGNOSIS — D689 Coagulation defect, unspecified: Secondary | ICD-10-CM

## 2020-11-15 NOTE — Progress Notes (Signed)
This patient returns to my office for at risk foot care.  This patient requires this care by a professional since this patient will be at risk due to having peripheral neuropathy, CKD, Coagulation defect and thrombocytopenia.  This patient is unable to cut nails himself since the patient cannot reach his nails.These nails are painful walking and wearing shoes.  This patient presents for at risk foot care today.  He presents to the office with male caregiver.  He is taking coumadin.  General Appearance  Alert, conversant and in no acute stress.  Vascular  Dorsalis pedis and posterior tibial  pulses are not  palpable secondary to severe swelling. bilaterally.  Capillary return is within normal limits  bilaterally. Temperature is within normal limits  bilaterally.  Neurologic  Senn-Weinstein monofilament wire test within normal limits  bilaterally. Muscle power within normal limits bilaterally.  Nails Thick disfigured discolored nails with subungual debris  from hallux to fifth toes bilaterally. Pincer nails hallux  B/L. No evidence of bacterial infection or drainage bilaterally.  Orthopedic  No limitations of motion  feet .  No crepitus or effusions noted.  No bony pathology or digital deformities noted.  Skin  normotropic skin with no porokeratosis noted bilaterally.  No signs of infections or ulcers noted.     Onychomycosis  Pain in right toes  Pain in left toes  Consent was obtained for treatment procedures.   Mechanical debridement of nails 1-5  bilaterally performed with a nail nipper.  Filed with dremel without incident.    Return office visit  10 weeks                   Told patient to return for periodic foot care and evaluation due to potential at risk complications.   Gardiner Barefoot DPM

## 2020-11-20 DIAGNOSIS — J684 Chronic respiratory conditions due to chemicals, gases, fumes and vapors: Secondary | ICD-10-CM | POA: Diagnosis not present

## 2020-11-20 DIAGNOSIS — E039 Hypothyroidism, unspecified: Secondary | ICD-10-CM | POA: Diagnosis not present

## 2020-11-20 DIAGNOSIS — N183 Chronic kidney disease, stage 3 unspecified: Secondary | ICD-10-CM | POA: Diagnosis not present

## 2020-11-20 DIAGNOSIS — E782 Mixed hyperlipidemia: Secondary | ICD-10-CM | POA: Diagnosis not present

## 2020-11-20 DIAGNOSIS — I2510 Atherosclerotic heart disease of native coronary artery without angina pectoris: Secondary | ICD-10-CM | POA: Diagnosis not present

## 2020-11-20 DIAGNOSIS — G459 Transient cerebral ischemic attack, unspecified: Secondary | ICD-10-CM | POA: Diagnosis not present

## 2020-11-20 DIAGNOSIS — F329 Major depressive disorder, single episode, unspecified: Secondary | ICD-10-CM | POA: Diagnosis not present

## 2020-11-20 DIAGNOSIS — I130 Hypertensive heart and chronic kidney disease with heart failure and stage 1 through stage 4 chronic kidney disease, or unspecified chronic kidney disease: Secondary | ICD-10-CM | POA: Diagnosis not present

## 2020-11-20 DIAGNOSIS — I10 Essential (primary) hypertension: Secondary | ICD-10-CM | POA: Diagnosis not present

## 2020-11-20 DIAGNOSIS — I480 Paroxysmal atrial fibrillation: Secondary | ICD-10-CM | POA: Diagnosis not present

## 2020-11-20 DIAGNOSIS — N400 Enlarged prostate without lower urinary tract symptoms: Secondary | ICD-10-CM | POA: Diagnosis not present

## 2020-11-20 DIAGNOSIS — I5032 Chronic diastolic (congestive) heart failure: Secondary | ICD-10-CM | POA: Diagnosis not present

## 2020-11-28 DIAGNOSIS — I5032 Chronic diastolic (congestive) heart failure: Secondary | ICD-10-CM | POA: Diagnosis not present

## 2020-12-18 DIAGNOSIS — Z8551 Personal history of malignant neoplasm of bladder: Secondary | ICD-10-CM | POA: Diagnosis not present

## 2020-12-18 DIAGNOSIS — N3021 Other chronic cystitis with hematuria: Secondary | ICD-10-CM | POA: Diagnosis not present

## 2020-12-18 DIAGNOSIS — R310 Gross hematuria: Secondary | ICD-10-CM | POA: Diagnosis not present

## 2020-12-29 DIAGNOSIS — I5032 Chronic diastolic (congestive) heart failure: Secondary | ICD-10-CM | POA: Diagnosis not present

## 2020-12-30 DIAGNOSIS — I639 Cerebral infarction, unspecified: Secondary | ICD-10-CM

## 2020-12-30 HISTORY — DX: Cerebral infarction, unspecified: I63.9

## 2021-01-04 DIAGNOSIS — I130 Hypertensive heart and chronic kidney disease with heart failure and stage 1 through stage 4 chronic kidney disease, or unspecified chronic kidney disease: Secondary | ICD-10-CM | POA: Diagnosis not present

## 2021-01-04 DIAGNOSIS — D6869 Other thrombophilia: Secondary | ICD-10-CM | POA: Diagnosis not present

## 2021-01-04 DIAGNOSIS — I5032 Chronic diastolic (congestive) heart failure: Secondary | ICD-10-CM | POA: Diagnosis not present

## 2021-01-04 DIAGNOSIS — I480 Paroxysmal atrial fibrillation: Secondary | ICD-10-CM | POA: Diagnosis not present

## 2021-01-04 DIAGNOSIS — F322 Major depressive disorder, single episode, severe without psychotic features: Secondary | ICD-10-CM | POA: Diagnosis not present

## 2021-01-04 DIAGNOSIS — G250 Essential tremor: Secondary | ICD-10-CM | POA: Diagnosis not present

## 2021-01-04 DIAGNOSIS — N390 Urinary tract infection, site not specified: Secondary | ICD-10-CM | POA: Diagnosis not present

## 2021-01-04 DIAGNOSIS — G629 Polyneuropathy, unspecified: Secondary | ICD-10-CM | POA: Diagnosis not present

## 2021-01-04 DIAGNOSIS — N400 Enlarged prostate without lower urinary tract symptoms: Secondary | ICD-10-CM | POA: Diagnosis not present

## 2021-01-04 DIAGNOSIS — R32 Unspecified urinary incontinence: Secondary | ICD-10-CM | POA: Diagnosis not present

## 2021-01-04 DIAGNOSIS — D509 Iron deficiency anemia, unspecified: Secondary | ICD-10-CM | POA: Diagnosis not present

## 2021-01-04 DIAGNOSIS — I2510 Atherosclerotic heart disease of native coronary artery without angina pectoris: Secondary | ICD-10-CM | POA: Diagnosis not present

## 2021-01-05 ENCOUNTER — Ambulatory Visit (INDEPENDENT_AMBULATORY_CARE_PROVIDER_SITE_OTHER): Payer: Medicare Other

## 2021-01-05 DIAGNOSIS — I495 Sick sinus syndrome: Secondary | ICD-10-CM

## 2021-01-06 LAB — CUP PACEART REMOTE DEVICE CHECK
Battery Remaining Longevity: 58 mo
Battery Voltage: 2.95 V
Brady Statistic AP VP Percent: 99.97 %
Brady Statistic AP VS Percent: 0 %
Brady Statistic AS VP Percent: 0 %
Brady Statistic AS VS Percent: 0.03 %
Brady Statistic RA Percent Paced: 99.98 %
Brady Statistic RV Percent Paced: 99.97 %
Date Time Interrogation Session: 20220107021704
Implantable Lead Implant Date: 20191206
Implantable Lead Implant Date: 20191206
Implantable Lead Location: 753860
Implantable Lead Location: 753860
Implantable Lead Model: 3830
Implantable Lead Model: 5076
Implantable Pulse Generator Implant Date: 20191206
Lead Channel Impedance Value: 304 Ohm
Lead Channel Impedance Value: 323 Ohm
Lead Channel Impedance Value: 399 Ohm
Lead Channel Impedance Value: 418 Ohm
Lead Channel Sensing Intrinsic Amplitude: 2.875 mV
Lead Channel Sensing Intrinsic Amplitude: 4.25 mV
Lead Channel Sensing Intrinsic Amplitude: 4.375 mV
Lead Channel Sensing Intrinsic Amplitude: 4.375 mV
Lead Channel Setting Pacing Amplitude: 2 V
Lead Channel Setting Pacing Amplitude: 2.5 V
Lead Channel Setting Pacing Pulse Width: 0.4 ms
Lead Channel Setting Sensing Sensitivity: 1.2 mV

## 2021-01-19 NOTE — Progress Notes (Signed)
Remote pacemaker transmission.   

## 2021-01-24 ENCOUNTER — Other Ambulatory Visit: Payer: Self-pay

## 2021-01-24 ENCOUNTER — Ambulatory Visit (INDEPENDENT_AMBULATORY_CARE_PROVIDER_SITE_OTHER): Payer: Medicare Other | Admitting: Podiatry

## 2021-01-24 ENCOUNTER — Encounter: Payer: Self-pay | Admitting: Podiatry

## 2021-01-24 DIAGNOSIS — L608 Other nail disorders: Secondary | ICD-10-CM | POA: Diagnosis not present

## 2021-01-24 DIAGNOSIS — I2510 Atherosclerotic heart disease of native coronary artery without angina pectoris: Secondary | ICD-10-CM | POA: Diagnosis not present

## 2021-01-24 DIAGNOSIS — E1142 Type 2 diabetes mellitus with diabetic polyneuropathy: Secondary | ICD-10-CM | POA: Diagnosis not present

## 2021-01-24 DIAGNOSIS — D689 Coagulation defect, unspecified: Secondary | ICD-10-CM | POA: Diagnosis not present

## 2021-01-24 DIAGNOSIS — N183 Chronic kidney disease, stage 3 unspecified: Secondary | ICD-10-CM | POA: Diagnosis not present

## 2021-01-24 DIAGNOSIS — I10 Essential (primary) hypertension: Secondary | ICD-10-CM | POA: Diagnosis not present

## 2021-01-24 DIAGNOSIS — M79676 Pain in unspecified toe(s): Secondary | ICD-10-CM | POA: Diagnosis not present

## 2021-01-24 DIAGNOSIS — B351 Tinea unguium: Secondary | ICD-10-CM

## 2021-01-24 DIAGNOSIS — G459 Transient cerebral ischemic attack, unspecified: Secondary | ICD-10-CM | POA: Diagnosis not present

## 2021-01-24 DIAGNOSIS — E782 Mixed hyperlipidemia: Secondary | ICD-10-CM | POA: Diagnosis not present

## 2021-01-24 DIAGNOSIS — K219 Gastro-esophageal reflux disease without esophagitis: Secondary | ICD-10-CM | POA: Diagnosis not present

## 2021-01-24 DIAGNOSIS — I5032 Chronic diastolic (congestive) heart failure: Secondary | ICD-10-CM | POA: Diagnosis not present

## 2021-01-24 DIAGNOSIS — E039 Hypothyroidism, unspecified: Secondary | ICD-10-CM | POA: Diagnosis not present

## 2021-01-24 DIAGNOSIS — I5030 Unspecified diastolic (congestive) heart failure: Secondary | ICD-10-CM | POA: Diagnosis not present

## 2021-01-24 DIAGNOSIS — N400 Enlarged prostate without lower urinary tract symptoms: Secondary | ICD-10-CM | POA: Diagnosis not present

## 2021-01-24 DIAGNOSIS — I480 Paroxysmal atrial fibrillation: Secondary | ICD-10-CM | POA: Diagnosis not present

## 2021-01-24 DIAGNOSIS — I4891 Unspecified atrial fibrillation: Secondary | ICD-10-CM | POA: Diagnosis not present

## 2021-01-24 NOTE — Progress Notes (Signed)
This patient returns to my office for at risk foot care.  This patient requires this care by a professional since this patient will be at risk due to having peripheral neuropathy, CKD, Coagulation defect and thrombocytopenia.  This patient is unable to cut nails himself since the patient cannot reach his nails.These nails are painful walking and wearing shoes.  This patient presents for at risk foot care today.  He presents to the office with male caregiver.  He is taking coumadin.  General Appearance  Alert, conversant and in no acute stress.  Vascular  Dorsalis pedis and posterior tibial  pulses are not  palpable  bilaterally.  Capillary return is within normal limits  bilaterally. Temperature is within normal limits  Bilaterally. Swelling feet/legs  Neurologic  Senn-Weinstein monofilament wire test within normal limits  bilaterally. Muscle power within normal limits bilaterally.  Nails Thick disfigured discolored nails with subungual debris  from hallux to fifth toes bilaterally. Pincer nails hallux  B/L. No evidence of bacterial infection or drainage bilaterally.  Orthopedic  No limitations of motion  feet .  No crepitus or effusions noted.  No bony pathology or digital deformities noted.  Skin  normotropic skin with no porokeratosis noted bilaterally.  No signs of infections or ulcers noted.     Onychomycosis  Pain in right toes  Pain in left toes  Consent was obtained for treatment procedures.   Mechanical debridement of nails 1-5  bilaterally performed with a nail nipper.  Filed with dremel without incident.    Return office visit  10 weeks                   Told patient to return for periodic foot care and evaluation due to potential at risk complications.   Gardiner Barefoot DPM

## 2021-01-28 ENCOUNTER — Emergency Department (HOSPITAL_COMMUNITY): Payer: Medicare Other

## 2021-01-28 ENCOUNTER — Inpatient Hospital Stay (HOSPITAL_COMMUNITY): Payer: Medicare Other

## 2021-01-28 ENCOUNTER — Inpatient Hospital Stay (HOSPITAL_COMMUNITY)
Admission: EM | Admit: 2021-01-28 | Discharge: 2021-02-01 | DRG: 065 | Disposition: A | Discharge status: Rehabilitation Facility | Payer: Medicare Other | Attending: Student | Admitting: Student

## 2021-01-28 DIAGNOSIS — G8929 Other chronic pain: Secondary | ICD-10-CM | POA: Diagnosis present

## 2021-01-28 DIAGNOSIS — R58 Hemorrhage, not elsewhere classified: Secondary | ICD-10-CM | POA: Diagnosis present

## 2021-01-28 DIAGNOSIS — I6523 Occlusion and stenosis of bilateral carotid arteries: Secondary | ICD-10-CM | POA: Diagnosis not present

## 2021-01-28 DIAGNOSIS — Z888 Allergy status to other drugs, medicaments and biological substances status: Secondary | ICD-10-CM

## 2021-01-28 DIAGNOSIS — I69351 Hemiplegia and hemiparesis following cerebral infarction affecting right dominant side: Secondary | ICD-10-CM | POA: Diagnosis present

## 2021-01-28 DIAGNOSIS — G250 Essential tremor: Secondary | ICD-10-CM | POA: Diagnosis present

## 2021-01-28 DIAGNOSIS — R4701 Aphasia: Secondary | ICD-10-CM | POA: Diagnosis present

## 2021-01-28 DIAGNOSIS — I480 Paroxysmal atrial fibrillation: Secondary | ICD-10-CM | POA: Diagnosis present

## 2021-01-28 DIAGNOSIS — R29810 Facial weakness: Secondary | ICD-10-CM | POA: Diagnosis not present

## 2021-01-28 DIAGNOSIS — I639 Cerebral infarction, unspecified: Principal | ICD-10-CM

## 2021-01-28 DIAGNOSIS — I69391 Dysphagia following cerebral infarction: Secondary | ICD-10-CM | POA: Diagnosis not present

## 2021-01-28 DIAGNOSIS — S20229A Contusion of unspecified back wall of thorax, initial encounter: Secondary | ICD-10-CM | POA: Diagnosis present

## 2021-01-28 DIAGNOSIS — G9340 Encephalopathy, unspecified: Secondary | ICD-10-CM | POA: Diagnosis present

## 2021-01-28 DIAGNOSIS — I2510 Atherosclerotic heart disease of native coronary artery without angina pectoris: Secondary | ICD-10-CM | POA: Diagnosis present

## 2021-01-28 DIAGNOSIS — I63512 Cerebral infarction due to unspecified occlusion or stenosis of left middle cerebral artery: Secondary | ICD-10-CM | POA: Diagnosis not present

## 2021-01-28 DIAGNOSIS — Z20822 Contact with and (suspected) exposure to covid-19: Secondary | ICD-10-CM | POA: Diagnosis present

## 2021-01-28 DIAGNOSIS — Z8673 Personal history of transient ischemic attack (TIA), and cerebral infarction without residual deficits: Secondary | ICD-10-CM

## 2021-01-28 DIAGNOSIS — K219 Gastro-esophageal reflux disease without esophagitis: Secondary | ICD-10-CM | POA: Diagnosis present

## 2021-01-28 DIAGNOSIS — R4781 Slurred speech: Secondary | ICD-10-CM | POA: Diagnosis not present

## 2021-01-28 DIAGNOSIS — R531 Weakness: Secondary | ICD-10-CM | POA: Diagnosis not present

## 2021-01-28 DIAGNOSIS — R5381 Other malaise: Secondary | ICD-10-CM | POA: Diagnosis present

## 2021-01-28 DIAGNOSIS — D631 Anemia in chronic kidney disease: Secondary | ICD-10-CM | POA: Diagnosis not present

## 2021-01-28 DIAGNOSIS — R404 Transient alteration of awareness: Secondary | ICD-10-CM | POA: Diagnosis not present

## 2021-01-28 DIAGNOSIS — D649 Anemia, unspecified: Secondary | ICD-10-CM | POA: Diagnosis not present

## 2021-01-28 DIAGNOSIS — Z7901 Long term (current) use of anticoagulants: Secondary | ICD-10-CM | POA: Diagnosis not present

## 2021-01-28 DIAGNOSIS — I6389 Other cerebral infarction: Secondary | ICD-10-CM | POA: Diagnosis not present

## 2021-01-28 DIAGNOSIS — E785 Hyperlipidemia, unspecified: Secondary | ICD-10-CM | POA: Diagnosis present

## 2021-01-28 DIAGNOSIS — R29818 Other symptoms and signs involving the nervous system: Secondary | ICD-10-CM | POA: Diagnosis not present

## 2021-01-28 DIAGNOSIS — Z79899 Other long term (current) drug therapy: Secondary | ICD-10-CM

## 2021-01-28 DIAGNOSIS — Z66 Do not resuscitate: Secondary | ICD-10-CM | POA: Diagnosis present

## 2021-01-28 DIAGNOSIS — N1832 Chronic kidney disease, stage 3b: Secondary | ICD-10-CM | POA: Diagnosis present

## 2021-01-28 DIAGNOSIS — I5032 Chronic diastolic (congestive) heart failure: Secondary | ICD-10-CM | POA: Diagnosis not present

## 2021-01-28 DIAGNOSIS — I495 Sick sinus syndrome: Secondary | ICD-10-CM | POA: Diagnosis present

## 2021-01-28 DIAGNOSIS — R296 Repeated falls: Secondary | ICD-10-CM | POA: Diagnosis present

## 2021-01-28 DIAGNOSIS — I63412 Cerebral infarction due to embolism of left middle cerebral artery: Secondary | ICD-10-CM | POA: Diagnosis not present

## 2021-01-28 DIAGNOSIS — I130 Hypertensive heart and chronic kidney disease with heart failure and stage 1 through stage 4 chronic kidney disease, or unspecified chronic kidney disease: Secondary | ICD-10-CM | POA: Diagnosis present

## 2021-01-28 DIAGNOSIS — R1312 Dysphagia, oropharyngeal phase: Secondary | ICD-10-CM | POA: Diagnosis not present

## 2021-01-28 DIAGNOSIS — R0681 Apnea, not elsewhere classified: Secondary | ICD-10-CM | POA: Diagnosis present

## 2021-01-28 DIAGNOSIS — I6781 Acute cerebrovascular insufficiency: Secondary | ICD-10-CM | POA: Diagnosis not present

## 2021-01-28 DIAGNOSIS — Z7982 Long term (current) use of aspirin: Secondary | ICD-10-CM

## 2021-01-28 DIAGNOSIS — E039 Hypothyroidism, unspecified: Secondary | ICD-10-CM | POA: Diagnosis present

## 2021-01-28 DIAGNOSIS — I69392 Facial weakness following cerebral infarction: Secondary | ICD-10-CM | POA: Diagnosis not present

## 2021-01-28 DIAGNOSIS — R1310 Dysphagia, unspecified: Secondary | ICD-10-CM | POA: Diagnosis present

## 2021-01-28 DIAGNOSIS — R7989 Other specified abnormal findings of blood chemistry: Secondary | ICD-10-CM | POA: Diagnosis not present

## 2021-01-28 DIAGNOSIS — Z85828 Personal history of other malignant neoplasm of skin: Secondary | ICD-10-CM

## 2021-01-28 DIAGNOSIS — Z87891 Personal history of nicotine dependence: Secondary | ICD-10-CM | POA: Diagnosis not present

## 2021-01-28 DIAGNOSIS — I77810 Thoracic aortic ectasia: Secondary | ICD-10-CM | POA: Diagnosis present

## 2021-01-28 DIAGNOSIS — R471 Dysarthria and anarthria: Secondary | ICD-10-CM | POA: Diagnosis present

## 2021-01-28 DIAGNOSIS — G629 Polyneuropathy, unspecified: Secondary | ICD-10-CM | POA: Diagnosis present

## 2021-01-28 DIAGNOSIS — Z8619 Personal history of other infectious and parasitic diseases: Secondary | ICD-10-CM | POA: Diagnosis not present

## 2021-01-28 DIAGNOSIS — I10 Essential (primary) hypertension: Secondary | ICD-10-CM | POA: Diagnosis not present

## 2021-01-28 DIAGNOSIS — N400 Enlarged prostate without lower urinary tract symptoms: Secondary | ICD-10-CM | POA: Diagnosis present

## 2021-01-28 DIAGNOSIS — R269 Unspecified abnormalities of gait and mobility: Secondary | ICD-10-CM | POA: Diagnosis present

## 2021-01-28 DIAGNOSIS — Z821 Family history of blindness and visual loss: Secondary | ICD-10-CM

## 2021-01-28 DIAGNOSIS — Z882 Allergy status to sulfonamides status: Secondary | ICD-10-CM | POA: Diagnosis not present

## 2021-01-28 DIAGNOSIS — H548 Legal blindness, as defined in USA: Secondary | ICD-10-CM | POA: Diagnosis present

## 2021-01-28 DIAGNOSIS — I129 Hypertensive chronic kidney disease with stage 1 through stage 4 chronic kidney disease, or unspecified chronic kidney disease: Secondary | ICD-10-CM | POA: Diagnosis present

## 2021-01-28 DIAGNOSIS — H9190 Unspecified hearing loss, unspecified ear: Secondary | ICD-10-CM | POA: Diagnosis present

## 2021-01-28 DIAGNOSIS — I6602 Occlusion and stenosis of left middle cerebral artery: Secondary | ICD-10-CM | POA: Diagnosis not present

## 2021-01-28 DIAGNOSIS — Z950 Presence of cardiac pacemaker: Secondary | ICD-10-CM | POA: Diagnosis not present

## 2021-01-28 DIAGNOSIS — D696 Thrombocytopenia, unspecified: Secondary | ICD-10-CM | POA: Diagnosis not present

## 2021-01-28 DIAGNOSIS — H3530 Unspecified macular degeneration: Secondary | ICD-10-CM | POA: Diagnosis present

## 2021-01-28 DIAGNOSIS — I6609 Occlusion and stenosis of unspecified middle cerebral artery: Secondary | ICD-10-CM | POA: Diagnosis present

## 2021-01-28 DIAGNOSIS — G894 Chronic pain syndrome: Secondary | ICD-10-CM | POA: Diagnosis present

## 2021-01-28 DIAGNOSIS — M549 Dorsalgia, unspecified: Secondary | ICD-10-CM | POA: Diagnosis present

## 2021-01-28 DIAGNOSIS — Z79890 Hormone replacement therapy (postmenopausal): Secondary | ICD-10-CM

## 2021-01-28 DIAGNOSIS — I69320 Aphasia following cerebral infarction: Secondary | ICD-10-CM | POA: Diagnosis not present

## 2021-01-28 DIAGNOSIS — I6782 Cerebral ischemia: Secondary | ICD-10-CM | POA: Diagnosis not present

## 2021-01-28 LAB — AMMONIA: Ammonia: 21 umol/L (ref 9–35)

## 2021-01-28 LAB — ECHOCARDIOGRAM COMPLETE
Area-P 1/2: 3.6 cm2
Height: 70.5 in
S' Lateral: 3.4 cm
Weight: 3255.75 oz

## 2021-01-28 LAB — URINALYSIS, COMPLETE (UACMP) WITH MICROSCOPIC
Bilirubin Urine: NEGATIVE
Glucose, UA: NEGATIVE mg/dL
Hgb urine dipstick: NEGATIVE
Ketones, ur: NEGATIVE mg/dL
Nitrite: NEGATIVE
Protein, ur: NEGATIVE mg/dL
Specific Gravity, Urine: 1.02 (ref 1.005–1.030)
WBC, UA: >50 WBC/hpf — ABNORMAL HIGH (ref 0–5)
pH: 7 (ref 5.0–8.0)

## 2021-01-28 LAB — RAPID URINE DRUG SCREEN, HOSP PERFORMED
Amphetamines: NOT DETECTED
Barbiturates: POSITIVE — AB
Benzodiazepines: NOT DETECTED
Cocaine: NOT DETECTED
Opiates: NOT DETECTED
Tetrahydrocannabinol: NOT DETECTED

## 2021-01-28 LAB — DIFFERENTIAL
Abs Immature Granulocytes: 0.02 10*3/uL (ref 0.00–0.07)
Basophils Absolute: 0 10*3/uL (ref 0.0–0.1)
Basophils Relative: 0 %
Eosinophils Absolute: 0.1 10*3/uL (ref 0.0–0.5)
Eosinophils Relative: 2 %
Immature Granulocytes: 0 %
Lymphocytes Relative: 24 %
Lymphs Abs: 1.5 10*3/uL (ref 0.7–4.0)
Monocytes Absolute: 0.8 10*3/uL (ref 0.1–1.0)
Monocytes Relative: 13 %
Neutro Abs: 3.7 10*3/uL (ref 1.7–7.7)
Neutrophils Relative %: 61 %

## 2021-01-28 LAB — COMPREHENSIVE METABOLIC PANEL
ALT: 26 U/L (ref 0–44)
AST: 21 U/L (ref 15–41)
Albumin: 3.6 g/dL (ref 3.5–5.0)
Alkaline Phosphatase: 120 U/L (ref 38–126)
Anion gap: 10 (ref 5–15)
BUN: 34 mg/dL — ABNORMAL HIGH (ref 8–23)
CO2: 26 mmol/L (ref 22–32)
Calcium: 9 mg/dL (ref 8.9–10.3)
Chloride: 103 mmol/L (ref 98–111)
Creatinine, Ser: 1.54 mg/dL — ABNORMAL HIGH (ref 0.61–1.24)
GFR, Estimated: 42 mL/min — ABNORMAL LOW (ref >60)
Glucose, Bld: 105 mg/dL — ABNORMAL HIGH (ref 70–99)
Potassium: 3.9 mmol/L (ref 3.5–5.1)
Sodium: 139 mmol/L (ref 135–145)
Total Bilirubin: 0.7 mg/dL (ref 0.3–1.2)
Total Protein: 6.7 g/dL (ref 6.5–8.1)

## 2021-01-28 LAB — CBC
HCT: 35.9 % — ABNORMAL LOW (ref 39.0–52.0)
Hemoglobin: 11.6 g/dL — ABNORMAL LOW (ref 13.0–17.0)
MCH: 32.2 pg (ref 26.0–34.0)
MCHC: 32.3 g/dL (ref 30.0–36.0)
MCV: 99.7 fL (ref 80.0–100.0)
Platelets: 106 10*3/uL — ABNORMAL LOW (ref 150–400)
RBC: 3.6 MIL/uL — ABNORMAL LOW (ref 4.22–5.81)
RDW: 16.5 % — ABNORMAL HIGH (ref 11.5–15.5)
WBC: 6.2 10*3/uL (ref 4.0–10.5)
nRBC: 0 % (ref 0.0–0.2)

## 2021-01-28 LAB — I-STAT CHEM 8, ED
BUN: 35 mg/dL — ABNORMAL HIGH (ref 8–23)
Calcium, Ion: 1.18 mmol/L (ref 1.15–1.40)
Chloride: 103 mmol/L (ref 98–111)
Creatinine, Ser: 1.4 mg/dL — ABNORMAL HIGH (ref 0.61–1.24)
Glucose, Bld: 97 mg/dL (ref 70–99)
HCT: 35 % — ABNORMAL LOW (ref 39.0–52.0)
Hemoglobin: 11.9 g/dL — ABNORMAL LOW (ref 13.0–17.0)
Potassium: 3.9 mmol/L (ref 3.5–5.1)
Sodium: 141 mmol/L (ref 135–145)
TCO2: 25 mmol/L (ref 22–32)

## 2021-01-28 LAB — SARS CORONAVIRUS 2 BY RT PCR (HOSPITAL ORDER, PERFORMED IN ~~LOC~~ HOSPITAL LAB): SARS Coronavirus 2: NEGATIVE

## 2021-01-28 LAB — PROTIME-INR
INR: 1.1 (ref 0.8–1.2)
Prothrombin Time: 13.6 seconds (ref 11.4–15.2)

## 2021-01-28 LAB — ETHANOL: Alcohol, Ethyl (B): <10 mg/dL (ref <10)

## 2021-01-28 LAB — CBG MONITORING, ED: Glucose-Capillary: 86 mg/dL (ref 70–99)

## 2021-01-28 LAB — APTT: aPTT: 31 seconds (ref 24–36)

## 2021-01-28 LAB — LACTIC ACID, PLASMA: Lactic Acid, Venous: 1 mmol/L (ref 0.5–1.9)

## 2021-01-28 MED ORDER — ASPIRIN 300 MG RE SUPP
300.0000 mg | Freq: Every day | RECTAL | Status: DC
  Administered 2021-01-29: 300 mg via RECTAL
  Filled 2021-01-28: qty 1

## 2021-01-28 MED ORDER — STROKE: EARLY STAGES OF RECOVERY BOOK
Freq: Once | Status: AC
  Filled 2021-01-28: qty 1

## 2021-01-28 MED ORDER — HEPARIN SODIUM (PORCINE) 5000 UNIT/ML IJ SOLN
5000.0000 [IU] | Freq: Two times a day (BID) | INTRAMUSCULAR | Status: DC
  Administered 2021-01-28 – 2021-01-30 (×4): 5000 [IU] via SUBCUTANEOUS
  Filled 2021-01-28 (×4): qty 1

## 2021-01-28 MED ORDER — PRIMIDONE 250 MG PO TABS
250.0000 mg | ORAL_TABLET | Freq: Two times a day (BID) | ORAL | Status: DC
  Administered 2021-01-29 – 2021-02-01 (×7): 250 mg via ORAL
  Filled 2021-01-28 (×10): qty 1

## 2021-01-28 MED ORDER — FINASTERIDE 5 MG PO TABS
5.0000 mg | ORAL_TABLET | Freq: Every day | ORAL | Status: DC
Start: 2021-01-29 — End: 2021-02-01
  Administered 2021-01-29 – 2021-02-01 (×4): 5 mg via ORAL
  Filled 2021-01-28 (×4): qty 1

## 2021-01-28 MED ORDER — SODIUM CHLORIDE 0.9 % IV BOLUS
750.0000 mL | Freq: Once | INTRAVENOUS | Status: AC
  Administered 2021-01-28: 750 mL via INTRAVENOUS

## 2021-01-28 MED ORDER — LEVOTHYROXINE SODIUM 25 MCG PO TABS
25.0000 ug | ORAL_TABLET | Freq: Every day | ORAL | Status: DC
  Administered 2021-01-29 – 2021-02-01 (×4): 25 ug via ORAL
  Filled 2021-01-28 (×4): qty 1

## 2021-01-28 MED ORDER — ACETAMINOPHEN 325 MG PO TABS
650.0000 mg | ORAL_TABLET | ORAL | Status: DC | PRN
  Administered 2021-01-28 – 2021-02-01 (×7): 650 mg via ORAL
  Filled 2021-01-28 (×7): qty 2

## 2021-01-28 MED ORDER — TOPIRAMATE 25 MG PO TABS
50.0000 mg | ORAL_TABLET | Freq: Two times a day (BID) | ORAL | Status: DC
  Administered 2021-01-29 – 2021-02-01 (×7): 50 mg via ORAL
  Filled 2021-01-28 (×7): qty 2

## 2021-01-28 MED ORDER — TAMSULOSIN HCL 0.4 MG PO CAPS
0.4000 mg | ORAL_CAPSULE | Freq: Two times a day (BID) | ORAL | Status: DC
  Administered 2021-01-29 – 2021-02-01 (×7): 0.4 mg via ORAL
  Filled 2021-01-28 (×7): qty 1

## 2021-01-28 MED ORDER — SODIUM CHLORIDE 0.9 % IV BOLUS
250.0000 mL | Freq: Once | INTRAVENOUS | Status: AC
  Administered 2021-01-28: 250 mL via INTRAVENOUS

## 2021-01-28 MED ORDER — ASPIRIN 300 MG RE SUPP
300.0000 mg | Freq: Once | RECTAL | Status: AC
  Administered 2021-01-28: 300 mg via RECTAL

## 2021-01-28 MED ORDER — ACETAMINOPHEN 650 MG RE SUPP: 650.0000 mg | RECTAL | Status: DC | PRN

## 2021-01-28 MED ORDER — SODIUM CHLORIDE 0.9 % IV SOLN: INTRAVENOUS | Status: AC

## 2021-01-28 MED ORDER — IOHEXOL 350 MG/ML SOLN
100.0000 mL | Freq: Once | INTRAVENOUS | Status: AC | PRN
  Administered 2021-01-28: 100 mL via INTRAVENOUS

## 2021-01-28 MED ORDER — PREGABALIN 25 MG PO CAPS
25.0000 mg | ORAL_CAPSULE | Freq: Every day | ORAL | Status: DC
  Administered 2021-01-29 – 2021-01-31 (×3): 25 mg via ORAL
  Filled 2021-01-28 (×3): qty 1

## 2021-01-28 MED ORDER — HYDRALAZINE HCL 20 MG/ML IJ SOLN: 5.0000 mg | INTRAMUSCULAR | Status: DC | PRN

## 2021-01-28 MED ORDER — SENNOSIDES-DOCUSATE SODIUM 8.6-50 MG PO TABS: 1.0000 | ORAL_TABLET | Freq: Every evening | ORAL | Status: DC | PRN

## 2021-01-28 MED ORDER — ACETAMINOPHEN 160 MG/5ML PO SOLN: 650.0000 mg | ORAL | Status: DC | PRN

## 2021-01-28 MED ORDER — DULOXETINE HCL 20 MG PO CPEP
20.0000 mg | ORAL_CAPSULE | Freq: Every day | ORAL | Status: DC
Start: 2021-01-29 — End: 2021-02-01
  Administered 2021-01-29 – 2021-02-01 (×4): 20 mg via ORAL
  Filled 2021-01-28 (×4): qty 1

## 2021-01-28 NOTE — ED Triage Notes (Signed)
To ED via [facility] from home, with son stating that at 0945 his dad was not acting right-- he heard him walk to the bathroom at 4am and again at 7am- saw him at 0945 and pt was unable to talk On arrival pt is drooling, is aphasic and dysrthric. Equal grips

## 2021-01-28 NOTE — Consult Note (Signed)
Neurology Consultation  Reason for Consult: right facial droop, aphasia Referring Physician: Margarita Mail  CC: right facial droop, aphasia  History is obtained from: patient son via telephone, EDP, chart review  HPI: Douglas Walker is a 87 y.o. male with a medical history significant for essential tremor (on primidone), chronic kidney disease stage III, congestive heart failure, coronary artery disease, paroxysmal atrial fibrillation (not on [facility]), GERD, hypertension, hyperlipidemia, TIA, and tachycardia syndrome with permanent pacemaker who presented to [facility] today 01/28/21 with right facial droop on daily aspirin, and aphasia. Per son, patient was last seen normal at 21:45 last night (1/29) and was heard going to the bathroom at 04:00 and 07:00. Patient got out of bed at 07:00 and got dressed and laid back down in bed (this is normal per patient son who he lives with) but when his son went to wake him at 09:00 for breakfast, he noticed right facial drooping and that Douglas Walker was unable to get out of bed, leaning to the side, and was unable to communicate. EMS was activated and neurology was consulted for a stroke alert by EDP.   Of note, at baseline patient lives with his son, uses a walker for ambulation, is communicative with an intact memory, and is able to attend to ADLs like dressing and voiding independently.  Additionally, he had been on warfarin for his paroxysmal atrial fibrillation but had been taken off of this medication due to having a bleeding complication of a hematoma on his back.  Additionally his blood pressure medications have been stopped in August, family checks blood pressure regularly and reports that it typically runs with systolics in the Q000111Q to 0000000 and diastolics in the 0000000 to Q000111Q at home.  He is legally blind at baseline but, for example will come right up to the TV screen to watch a football game.  Family does feel like he may have had some slight cognitive decline in  the last 2 to 3 weeks with a difficulty understanding none at times.  Otherwise review of systems is provided by son and daughter-in-law is negative  LKW: 01/27/2021 21:45 tpa given?: no, outside of time window IR? No, M3/M4 occlusion too distal for thrombectomy  ROS: Unable to obtain due to altered mental status/Broca's aphasia.   Past Medical History:  Diagnosis Date  . Arthritis    shoulders and back  . Cancer ([place])    skin cancers  . Chronic low back pain 12/15/2017  . CKD (chronic kidney disease) stage 3, GFR 30-59 ml/min (HCC)   . Coronary artery disease   . Dysrhythmia     Paroxysmal atrial fibrillation; Tachycardia-bradycardia syndrome  . Essential tremor 02/28/2016  . GERD (gastroesophageal reflux disease)   . Hernia   . History of hiatal hernia   . Hyperlipemia   . Hypertension   . Macular degeneration   . Neuromuscular disorder (HCC)    neuropathy  . Paroxysmal atrial fibrillation ([place]) 10/28/2013  . Presence of permanent cardiac pacemaker 12/04/2018  . Tachycardia-bradycardia syndrome ([place]) 09/13/2014  . TIA (transient ischemic attack)    Past Surgical History:  Procedure Laterality Date  . APPENDECTOMY    . EYE SURGERY    . HERNIA REPAIR    . HERNIA REPAIR    . INSERT / REPLACE / REMOVE PACEMAKER  12/04/2018  . IRRIGATION AND DEBRIDEMENT ABSCESS Right 07/24/2020   Procedure: IRRIGATION AND DEBRIDEMENT HEMATOMA;  Surgeon: Coralie Keens, MD;  Location: [place];  Service: General;  Laterality: Right;  .  MASS EXCISION Right 07/20/2020   Procedure: EXCISION OF RIGHT CHEST WALL MASS;  Surgeon: Stark Klein, MD;  Location: [place];  Service: General;  Laterality: Right;  . PACEMAKER IMPLANT N/A 12/04/2018   Procedure: PACEMAKER IMPLANT;  Surgeon: Evans Lance, MD;  Location: [place] [facility];  Service: Cardiovascular;  Laterality: N/A;   Family History  Problem Relation Age of Onset  . GI problems Mother   . Other Sister        PAIN ISSUES  . Hearing loss  Sister   . Blindness Sister   . Heart attack Neg Hx   . Stroke Neg Hx    Social History:   reports that he quit smoking about 58 years ago. He has never used smokeless tobacco. He reports that he does not drink alcohol and does not use drugs.  Medications Current Outpatient Medications  Medication Instructions  . acetaminophen (TYLENOL) 1,000 mg, Oral, Every 6 hours PRN  . amoxicillin-clavulanate (AUGMENTIN) 875-125 MG tablet 1 tablet, Oral, 2 times daily  . aspirin EC 81 mg, Oral, Daily, Swallow whole.   . beta carotene w/minerals (OCUVITE) tablet 1 tablet, Oral, Daily  . diclofenac Sodium (VOLTAREN) 2 g, Topical, 4 times daily PRN  . DULoxetine (CYMBALTA) 20 mg, Oral, 3 times daily, 2nd dose at lunch  . finasteride (PROSCAR) 5 mg, Oral, Daily  . furosemide (LASIX) 60 mg, Oral, Daily  . levothyroxine (SYNTHROID) 25 mcg, Oral, Daily before breakfast  . nitrofurantoin (macrocrystal-monohydrate) (MACROBID) 100 mg, Oral, 2 times daily  . pregabalin (LYRICA) 25 MG capsule TAKE 1 CAPSULE(25 MG) BY MOUTH AT BEDTIME  . primidone (MYSOLINE) 250 mg, Oral, 2 times daily, 2nd dose at lunch  . tamsulosin (FLOMAX) 0.4 mg, Oral, 2 times daily  . topiramate (TOPAMAX) 50 mg, Oral, 2 times daily  . vitamin B-12 (CYANOCOBALAMIN) 1,000 mcg, Oral, Every other day, Lunch    Exam: Current vital signs: BP (!) 153/77   Pulse 70   Resp (!) 21   SpO2 97%  Vital signs in last 24 hours: Pulse Rate:  [70] 70 (01/30 1115) Resp:  [21] 21 (01/30 1115) BP: (153)/(77) 153/77 (01/30 1115) SpO2:  [97 %] 97 % (01/30 1115)  GENERAL: Awake, alert, drooling from right mouth on initial assessment, tremor present in bilateral upper extremities, head, and neck.   HEAD: - Normocephalic and atraumatic EENT: wears eyeglasses at baseline, normal conjunctiva, no OP obstruction. Dry mm. Hearing-aids in place bilaterally LUNGS - Normal respiratory effort without labored breathing CV - regular rate and rhythm noted on  cardiac monitor ABDOMEN - Soft, non-tender, non-distended Ext: warm, well perfused  NEURO:  Mental Status: alert, speech is aphasic, incomprehensible but garbled speech when he attempts to vocalize. Decreased vocalization. Follows commands intermittently, missed commands partially due to hearing impairment at baseline. Speech/Language: Broca's aphasia- garbled, mostly moans with vocalization. Does not produce any comprehensible speech. Cranial Nerves:  II: PERRL 2 mm -> 1 mm. Attends to stimuli in all visual fields.  III, IV, VI: EOMI without ptosis. (Initially with leftward gaze palsy, unable to cross midline. 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 VII: Face is asymmetric with partial paresis of right mouth and nasolabial fold initially; stuttering periodically with resolution and recurrence during examination.   VIII: Hard of hearing at baseline but intact to loud voice with hearing-aids.  IX, X: Palate elevation is symmetric. XI: Normal sternocleidomastoid and trapezius muscle strength XII: Tongue protrudes midline Motor: 5/5 strength in bilateral upper and lower extremities with multiple attempts; limited assessment initially due to hard of hearing without hearing-aids in for CT imaging. Antigravity movement present in all extremities without pronator drift. Essential tremor is noted in bilateral upper extremities right more than left, neck, and head, increasing with intention- this is baseline for patient. Tone is normal. Bulk is normal.  Sensation- Patient does not answer questions about sensation lateralization/quality.  Coordination: Patient does not follow complex commands; unable to assess coordination with FNF, HKS. DTRs: 2+ bilateral upper extremities biceps and brachioradialis, unable to obtain patellae reflexes. Gait- deferred but  patient able to stand and balance with minimal assistance.  1a Level of Conscious.: 0 1b LOC Questions: 2 1c LOC Commands: 0 2 Best Gaze: 1; following initial CT imaging, leftward gaze palsy resolved for a 0 score 3 Visual: 2; following initial CT imaging, visual fields were reassessed with resolution of hemianopia for a 0 score 4 Facial Palsy: 2; following initial CT imaging, facial palsy with resolution 0 score 5a Motor Arm - left: 0 5b Motor Arm - Right: 0 6a Motor Leg - Left: 0 6b Motor Leg - Right: 0 7 Limb Ataxia: 0 8 Sensory: 0 9 Best Language: 2 10 Dysarthria: 2 11 Extinct. and Inatten.: 1; following initial CT imaging, reassessment revealed resolution for a 0 score TOTAL: 13 initially (10:45)             --->     (11:00) Following initial CT imaging, NIHSS 6  Labs I have reviewed labs in epic and the results pertinent to this consultation are: CBC    Component Value Date/Time   WBC 6.2 01/28/2021 1045   RBC 3.60 (L) 01/28/2021 1045   HGB 11.9 (L) 01/28/2021 1048   HGB 11.2 (L) 12/02/2018 1017   HGB 13.1 05/04/2013 1138   HCT 35.0 (L) 01/28/2021 1048   HCT 33.8 (L) 12/02/2018 1017   HCT 39.9 05/04/2013 1138   PLT 106 (L) 01/28/2021 1045   PLT 94 (LL) 12/02/2018 1017   MCV 99.7 01/28/2021 1045   MCV 99 (H) 12/02/2018 1017   MCV 94.6 05/04/2013 1138   MCH 32.2 01/28/2021 1045   MCHC 32.3 01/28/2021 1045   RDW 16.5 (H) 01/28/2021 1045   RDW 14.6 12/02/2018 1017   RDW 14.7 (H) 05/04/2013 1138   LYMPHSABS 1.5 01/28/2021 1045   LYMPHSABS 1.2 12/02/2018 1017   LYMPHSABS 1.3 05/04/2013 1138   MONOABS 0.8 01/28/2021 1045   MONOABS 0.6 05/04/2013 1138   EOSABS 0.1 01/28/2021 1045   EOSABS 0.1 12/02/2018 1017   BASOSABS 0.0 01/28/2021 1045   BASOSABS 0.0 12/02/2018 1017   BASOSABS 0.0 05/04/2013 1138   CMP     Component Value Date/Time   NA 141 01/28/2021 1048   NA 140 01/28/2019 1112   K 3.9 01/28/2021 1048   CL 103 01/28/2021 1048   CO2 26 08/24/2020 1123    GLUCOSE 97 01/28/2021 1048   BUN 35 (H) 01/28/2021 1048   BUN 35 01/28/2019 1112   CREATININE 1.40 (H) 01/28/2021 1048   CALCIUM 8.9 08/24/2020 1123   PROT 7.2 08/24/2020 1123   ALBUMIN 3.5 08/24/2020 1123   AST 20 08/24/2020 1123   ALT 21 08/24/2020 1123   ALKPHOS 154 (H) 08/24/2020 1123   BILITOT 0.6 08/24/2020 1123   GFRNONAA 39 (L) 08/24/2020 1123   GFRAA 45 (L)  08/24/2020 1123   U tox appropriately positive for barbiturates (on primidone at home)  Imaging I have reviewed the images obtained: CT-scan of the brain  IMPRESSION: 1. Aging brain without acute finding. 2. ASPECTS is 10  CT angio head and neck and CT cerebral perfusion IMPRESSION: 1. Left M3/4 branch occlusion with 25 cc of ischemic range cerebral perfusion. 2. No emergent large vessel occlusion. 3. Atherosclerosis without proximal flow limiting stenosis.  Assessment: 87 year old male with multiple stroke risk factors on home aspirin, as above, presented to [facility]28[facility]21 with right facial droop, aphasia, and drooling from right mouth and was activated as a stroke alert by EDP. CT head was obtained without evidence of acute intracranial process. CT angio and perfusion were obtained with evidence of left M3/M4 occlusion with 25cc of ischemic range cerebral perfusion, too distal for thrombectomy intervention. Last known well was last night at 21:45; despite waking and dressing himself this morning, the last family spoke with Douglas Walker without aphasia last night putting him outside of the window for thrombolytic therapy.  MRI imaging was considered but patient was unable to undergo MRI imaging for wake-up stroke protocol since he has a permanent pacemaker and appropriate staff to manage pacemaker for MRI are not available emergently. Etiology of occlusion likely cardioembolic in the setting of known atrial fibrillation and less likely atherosclerotic.  Recommendations:  # Left M3/M4 superior division MCA occlusion,  cardioembolic in the setting of A. fib not on anticoagulation - HgbA1c, fasting lipid panel - MRI when able to be obtained (permanent pacemaker) - Frequent neuro checks - Echocardiogram  - Permissive hypertension to 220/120; fluids as described under "history of congestive heart failure" - Prophylactic therapy- Antiplatelet med: Aspirin - dose 325mg  PO or 300mg  PR once and then 81 mg daily, add clopidogrel 300 mg load and 75 mg daily for 21 days if patient passes swallow study.  No NG tube per patient's goals of care - Risk factor modification - Telemetry monitoring - PT consult, OT consult, Speech consult - Stroke team to follow  # History of congestive heart failure -Received 250 cc of normal saline in the ED -Additional 750 cc at rate of 75 cc an hour.  Please reassess respiratory status and continue IV fluids as heart failure allows -Appreciate management of this and patient's additional comorbidities per primary team  # Goals of care Of note the patient's daughter-in-law reports her father had a similar course recently, where he was taken off of his Coumadin for his atrial fibrillation, had a large stroke, received tPA, had a bleeding complication, and ultimately had feeding tube placement.  In this setting they have had extensive goals of care discussion with Douglas Walker, and he has been very clear that he would not want aggressive end-of-life measures and would prefer a Palliative and comfort care route, including the fact that he would not want a feeding tube.  They noted that communication will be especially challenging for him given his hearing impairment and vision impairment at baseline.  Pt seen by NP/Neuro and later by MD. Note/plan to be edited by MD as needed.  Anibal Henderson, AGAC-NP [facility] Pager: [phone]) [phone]  85 minutes of critical care time were provided for this patient in consideration of acute interventions for a sudden change in mental status with  potentially life-threatening complications.  This included goals of care discussion with family as documented above, and is independent of time spent by the nurse practitioner  Lesleigh Noe MD-PhD [facility]  [phone]   

## 2021-01-28 NOTE — Evaluation (Signed)
Clinical/Bedside Swallow Evaluation Patient Details  Name: Douglas Walker MRN: [id number] Date of Birth: 1925-10-12  Today's Date: 01/28/2021 Time: SLP Start Time (ACUTE ONLY): 28 SLP Stop Time (ACUTE ONLY): 1555 SLP Time Calculation (min) (ACUTE ONLY): 25 min  Past Medical History:  Past Medical History:  Diagnosis Date  . Arthritis    shoulders and back  . Cancer ([place])    skin cancers  . Chronic low back pain 12/15/2017  . CKD (chronic kidney disease) stage 3, GFR 30-59 ml/min (HCC)   . Coronary artery disease   . Dysrhythmia     Paroxysmal atrial fibrillation; Tachycardia-bradycardia syndrome  . Essential tremor 02/28/2016  . GERD (gastroesophageal reflux disease)   . Hernia   . History of hiatal hernia   . Hyperlipemia   . Hypertension   . Macular degeneration   . Neuromuscular disorder (HCC)    neuropathy  . Paroxysmal atrial fibrillation ([place]) 10/28/2013  . Presence of permanent cardiac pacemaker 12/04/2018  . Tachycardia-bradycardia syndrome ([place]) 09/13/2014  . TIA (transient ischemic attack)    Past Surgical History:  Past Surgical History:  Procedure Laterality Date  . APPENDECTOMY    . EYE SURGERY    . HERNIA REPAIR    . HERNIA REPAIR    . INSERT / REPLACE / REMOVE PACEMAKER  12/04/2018  . IRRIGATION AND DEBRIDEMENT ABSCESS Right 07/24/2020   Procedure: IRRIGATION AND DEBRIDEMENT HEMATOMA;  Surgeon: Coralie Keens, MD;  Location: [place];  Service: General;  Laterality: Right;  . MASS EXCISION Right 07/20/2020   Procedure: EXCISION OF RIGHT CHEST WALL MASS;  Surgeon: Stark Klein, MD;  Location: [place];  Service: General;  Laterality: Right;  . PACEMAKER IMPLANT N/A 12/04/2018   Procedure: PACEMAKER IMPLANT;  Surgeon: Evans Lance, MD;  Location: [place] [facility];  Service: Cardiovascular;  Laterality: N/A;   HPI:  Patient is a 87 y.o. mqale with PMH: paroxysmal afib, not on anticoagulants due to frequent falls, peripheral neuropathy, essential tremor,  HTN, CKD stage IIIB, who presented to hospital from home (lives with son) with AMS. Son had reported sudden onset confusion, unable to talk , some drooling and wobbly when walking. CT head did not find anything significant, CTA showed occluded left M3/M4 but no intervention due to size of branch. MRI pending patient being more oriented.   Assessment / Plan / Recommendation Clinical Impression  Patient presents with a mild-mod oral and mild pharyngeal dysphagia. He exhibited one instance of delayed cough but overall, demonstrated good toleration of thin liquids via cups sips. Patient was not able to form lips around straw and had difficulty with oral preparatory phase when SLP giving cup sips, but when he was able to hold cup (he used both hands), he gave self sips and managed with only supervision. He exhibited delays in mastication and oral transit of regular solids. Patient appears safe to start on Dys 1, thin liquids diet with full supervision. SLP Visit Diagnosis: Dysphagia, unspecified (R13.10)    Aspiration Risk  Mild aspiration risk    Diet Recommendation Dysphagia 1 (Puree);Thin liquid   Liquid Administration via: Cup;No straw Medication Administration: Whole meds with puree Supervision: Patient able to self feed;Full supervision/cueing for compensatory strategies;Staff to assist with self feeding Compensations: Small sips/bites;Minimize environmental distractions;Slow rate Postural Changes: Seated upright at 90 degrees    Other  Recommendations Oral Care Recommendations: Oral care BID;Staff/trained caregiver to provide oral care   Follow up Recommendations Other (comment) (TBD)      Frequency  and Duration min 2x/week  1 week       Prognosis Prognosis for Safe Diet Advancement: Good      Swallow Study   General Date of Onset: 01/28/21 HPI: Patient is a 87 y.o. mqale with PMH: paroxysmal afib, not on anticoagulants due to frequent falls, peripheral neuropathy, essential  tremor, HTN, CKD stage IIIB, who presented to hospital from home (lives with son) with AMS. Son had reported sudden onset confusion, unable to talk , some drooling and wobbly when walking. CT head did not find anything significant, CTA showed occluded left M3/M4 but no intervention due to size of branch. MRI pending patient being more oriented. Type of Study: Bedside Swallow Evaluation Previous Swallow Assessment: None Diet Prior to this Study: NPO Temperature Spikes Noted: No Respiratory Status: Room air History of Recent Intubation: No Behavior/Cognition: Alert;Cooperative;Pleasant mood Oral Cavity Assessment: Within Functional Limits Oral Care Completed by SLP: Yes Oral Cavity - Dentition: Dentures, bottom;Dentures, top Vision: Functional for self-feeding Self-Feeding Abilities: Needs assist;Needs set up;Able to feed self Patient Positioning: Upright in bed Baseline Vocal Quality: Normal Volitional Cough: Cognitively unable to elicit Volitional Swallow: Unable to elicit    Oral/Motor/Sensory Function Overall Oral Motor/Sensory Function: Mild impairment Facial ROM: Reduced right;Reduced left Facial Symmetry: Within Functional Limits Lingual ROM: Reduced right;Reduced left Lingual Symmetry: Within Functional Limits Lingual Strength: Reduced   Ice Chips     Thin Liquid Thin Liquid: Impaired Presentation: Straw;Self Fed;Cup Oral Phase Impairments: Reduced labial seal Pharyngeal  Phase Impairments: Cough - Delayed Other Comments: One delayed cough with thin liquids. Not able to form lips around straw.    Nectar Thick     Honey Thick     Puree Puree: Within functional limits Presentation: Spoon   Solid     Solid: Impaired Oral Phase Impairments: Impaired mastication;Reduced lingual movement/coordination      Sonia Baller, MA, CCC-SLP Speech Therapy [facility]

## 2021-01-28 NOTE — ED Provider Notes (Signed)
[facility] EMERGENCY DEPARTMENT Provider Note   CSN: [id number] Arrival date & time: 01/28/21  1031     History Chief Complaint  Patient presents with  . Stroke Symptoms  . Code Stroke    Douglas Walker is a 87 y.o. male.  The history is provided by the EMS personnel and a relative. History limited by: expressive aphasia.  Altered Mental Status Presenting symptoms comment:  Limited ability to follow commands, R facial droop Dysarthria and expressive aphasia Inability to stand  Severity:  Severe Most recent episode:  Today (patient was able to dress himself at 7:00AM, seen again at 9:00am with presenting sxs) Episode history:  Continuous Timing:  Constant Progression:  Waxing and waning Chronicity:  New Context: recent infection (recent UTI)   Context: not alcohol use, not dementia, not head injury and not recent change in medication   Associated symptoms: eye deviation (evolving Left gaze preference), slurred speech and weakness        Past Medical History:  Diagnosis Date  . Arthritis    shoulders and back  . Cancer ([place])    skin cancers  . Chronic low back pain 12/15/2017  . CKD (chronic kidney disease) stage 3, GFR 30-59 ml/min (HCC)   . Coronary artery disease   . Dysrhythmia     Paroxysmal atrial fibrillation; Tachycardia-bradycardia syndrome  . Essential tremor 02/28/2016  . GERD (gastroesophageal reflux disease)   . Hernia   . History of hiatal hernia   . Hyperlipemia   . Hypertension   . Macular degeneration   . Neuromuscular disorder (HCC)    neuropathy  . Paroxysmal atrial fibrillation ([place]) 10/28/2013  . Presence of permanent cardiac pacemaker 12/04/2018  . Tachycardia-bradycardia syndrome ([place]) 09/13/2014  . TIA (transient ischemic attack)     Patient Active Problem List   Diagnosis Date Noted  . Pacemaker 08/30/2020  . Hematoma, chest wall 07/24/2020  . Exudative age-related macular degeneration of right eye with  inactive choroidal neovascularization ([place]) 05/10/2020  . Exudative age-related macular degeneration of left eye with inactive choroidal neovascularization ([place]) 05/10/2020  . Retinal hemorrhage of right eye 05/10/2020  . Exposure keratopathy, bilateral 05/10/2020  . Bilateral dry eyes 05/10/2020  . Advanced nonexudative age-related macular degeneration of both eyes with subfoveal involvement 05/10/2020  . Sick sinus syndrome ([place]) 12/06/2018  . Complete heart block ([place]) 12/04/2018  . Chronic low back pain 12/15/2017  . Hereditary and idiopathic peripheral neuropathy 07/01/2016  . Essential tremor 02/28/2016  . BPPV (benign paroxysmal positional vertigo) 06/19/2015  . Dizziness 06/18/2015  . Bacteria in urine 06/18/2015  . CKD (chronic kidney disease) stage 3, GFR 30-59 ml/min: Probable 06/18/2015  . Chronic diastolic CHF (congestive heart failure) ([place]) 05/30/2015  . Fall   . Syncope 05/19/2015  . UTI (lower urinary tract infection) 05/19/2015  . Hypotension 05/19/2015  . Dehydration 05/19/2015  . Fracture of rib of left side 05/19/2015  . Syncope and collapse 05/19/2015  . Mobitz type II atrioventricular block 09/13/2014  . Essential hypertension 09/13/2014  . Chronic anticoagulation 09/13/2014  . Tachycardia-bradycardia syndrome ([place]) 09/13/2014  . Paroxysmal atrial fibrillation ([place]) 10/28/2013  . Thrombocytopenia ([place]) 04/06/2013    Past Surgical History:  Procedure Laterality Date  . APPENDECTOMY    . EYE SURGERY    . HERNIA REPAIR    . HERNIA REPAIR    . INSERT / REPLACE / REMOVE PACEMAKER  12/04/2018  . IRRIGATION AND DEBRIDEMENT ABSCESS Right 07/24/2020   Procedure: IRRIGATION AND DEBRIDEMENT  HEMATOMA;  Surgeon: Coralie Keens, MD;  Location: [facility];  Service: General;  Laterality: Right;  . MASS EXCISION Right 07/20/2020   Procedure: EXCISION OF RIGHT CHEST WALL MASS;  Surgeon: Stark Klein, MD;  Location: [facility];  Service: General;  Laterality: Right;  .  PACEMAKER IMPLANT N/A 12/04/2018   Procedure: PACEMAKER IMPLANT;  Surgeon: Evans Lance, MD;  Location: [facility];  Service: Cardiovascular;  Laterality: N/A;       Family History  Problem Relation Age of Onset  . GI problems Mother   . Other Sister        PAIN ISSUES  . Hearing loss Sister   . Blindness Sister   . Heart attack Neg Hx   . Stroke Neg Hx     Social History   Tobacco Use  . Smoking status: Former Smoker    Quit date: 12/30/1962    Years since quitting: 58.1  . Smokeless tobacco: Never Used  Vaping Use  . Vaping Use: Never used  Substance Use Topics  . Alcohol use: No  . Drug use: No    Home Medications Prior to Admission medications   Medication Sig Start Date End Date Taking? Authorizing Provider  acetaminophen (TYLENOL) 500 MG tablet Take 1,000 mg by mouth every 6 (six) hours as needed for mild pain.    [provider]  amoxicillin-clavulanate (AUGMENTIN) 875-125 MG tablet Take 1 tablet by mouth 2 (two) times daily. 12/21/20   [provider]  aspirin EC 81 MG tablet Take 81 mg by mouth daily. Swallow whole.    [provider]  beta carotene w/minerals (OCUVITE) tablet Take 1 tablet by mouth daily.     [provider]  diclofenac Sodium (VOLTAREN) 1 % GEL Apply 2 g topically 4 (four) times daily as needed (pain).    [provider]  DULoxetine (CYMBALTA) 20 MG capsule Take 20 mg by mouth in the morning, at noon, and at bedtime. 2nd dose at lunch    [provider]  finasteride (PROSCAR) 5 MG tablet Take 5 mg by mouth daily.    [provider]  furosemide (LASIX) 20 MG tablet Take 60 mg by mouth daily.  11/27/19   [provider]  levothyroxine (SYNTHROID) 25 MCG tablet TAKE 1 TABLET (25 MCG TOTAL) BY MOUTH DAILY BEFORE BREAKFAST. 05/17/19   Belva Crome, MD  nitrofurantoin, macrocrystal-monohydrate, (MACROBID) 100 MG capsule Take 100 mg by mouth 2 (two) times daily. 11/14/20    [provider]  pregabalin (LYRICA) 25 MG capsule TAKE 1 CAPSULE(25 MG) BY MOUTH AT BEDTIME 10/26/20   Suzzanne Cloud, NP  primidone (MYSOLINE) 250 MG tablet Take 250 mg by mouth 2 (two) times daily. 2nd dose at lunch 08/18/15   [provider]  Tamsulosin HCl (FLOMAX) 0.4 MG CAPS Take 0.4 mg by mouth 2 (two) times daily.     [provider]  topiramate (TOPAMAX) 50 MG tablet Take 1 tablet (50 mg total) by mouth 2 (two) times daily. 11/02/20   Suzzanne Cloud, NP  vitamin B-12 (CYANOCOBALAMIN) 1000 MCG tablet Take 1,000 mcg by mouth every other day. Lunch    [provider]    Allergies    Sulfa antibiotics  Review of Systems   Review of Systems  Unable to perform ROS: Mental status change  Neurological: Positive for weakness.    Physical Exam Updated Vital Signs BP (!) 153/77   Pulse 70   Resp (!) 21  SpO2 97%   Physical Exam Vitals and nursing note reviewed.  Constitutional:      General: He is not in acute distress.    Appearance: He is well-developed and well-nourished. He is not diaphoretic.  HENT:     Head: Normocephalic and atraumatic.  Eyes:     General: No scleral icterus.    Conjunctiva/sclera: Conjunctivae normal.  Cardiovascular:     Rate and Rhythm: Normal rate and regular rhythm.     Heart sounds: Normal heart sounds.  Pulmonary:     Effort: Pulmonary effort is normal. No respiratory distress.     Breath sounds: Normal breath sounds.  Abdominal:     Palpations: Abdomen is soft.     Tenderness: There is no abdominal tenderness.  Musculoskeletal:        General: No edema.     Cervical back: Normal range of motion and neck supple.  Skin:    General: Skin is warm and dry.  Neurological:     Mental Status: He is alert.     GCS: GCS eye subscore is 4. GCS verbal subscore is 2. GCS motor subscore is 5.     Cranial Nerves: Cranial nerve deficit, dysarthria and facial asymmetry present.     Motor: Tremor present. No pronator  drift.  Psychiatric:        Behavior: Behavior normal.     ED Results / Procedures / Treatments   Labs (all labs ordered are listed, but only abnormal results are displayed) Labs Reviewed  CBC - Abnormal; Notable for the following components:      Result Value   RBC 3.60 (*)    Hemoglobin 11.6 (*)    HCT 35.9 (*)    RDW 16.5 (*)    Platelets 106 (*)    All other components within normal limits  I-STAT CHEM 8, ED - Abnormal; Notable for the following components:   BUN 35 (*)    Creatinine, Ser 1.40 (*)    Hemoglobin 11.9 (*)    HCT 35.0 (*)    All other components within normal limits  SARS CORONAVIRUS 2 BY RT PCR (HOSPITAL ORDER, [facility])  ETHANOL  PROTIME-INR  APTT  DIFFERENTIAL  COMPREHENSIVE METABOLIC PANEL  RAPID URINE DRUG SCREEN, HOSP PERFORMED  URINALYSIS, ROUTINE W REFLEX MICROSCOPIC  URINALYSIS, COMPLETE (UACMP) WITH MICROSCOPIC  LACTIC ACID, PLASMA  AMMONIA  CBG MONITORING, ED    EKG EKG Interpretation  Date/Time:  Sunday January 28 2021 11:15:21 EST Ventricular Rate:  70 PR Interval:    QRS Duration: 128 QT Interval:  415 QTC Calculation: 448 R Axis:   16 Text Interpretation: Sinus rhythm Intraventricular conduction delay No significant change since last tracing Confirmed by Blanchie Dessert [id number]) on 01/28/2021 11:19:48 AM   Radiology CT Code Stroke CTA Head W/WO contrast  Result Date: 01/28/2021 CLINICAL DATA:  Facial droop.  Acute stroke suspected EXAM: CT ANGIOGRAPHY HEAD AND NECK CT PERFUSION BRAIN TECHNIQUE: Multidetector CT imaging of the head and neck was performed using the standard protocol during bolus administration of intravenous contrast. Multiplanar CT image reconstructions and MIPs were obtained to evaluate the vascular anatomy. Carotid stenosis measurements (when applicable) are obtained utilizing NASCET criteria, using the distal internal carotid diameter as the denominator. Multiphase CT imaging of the  brain was performed following IV bolus contrast injection. Subsequent parametric perfusion maps were calculated using RAPID software. CONTRAST:  Dose is currently not known COMPARISON:  Head CT from earlier today FINDINGS: CTA  NECK FINDINGS Aortic arch: Atheromatous plaque with 3 vessel branching. Right carotid system: Mild for age atheromatous plaque mainly at the bifurcation where there is mild proximal ICA narrowing mainly due to wasting rather than luminal plaque buildup. No ulceration. Left carotid system: Atheromatous wall thickening of the common carotid and proximal ICA, overall mild for age. No stenosis or ulceration. Vertebral arteries: No proximal subclavian stenosis. Mild left vertebral artery dominance. Scattered atheromatous calcifications of the vertebral arteries without flow limiting stenosis. No beading or dissection. Skeleton: No acute finding Other neck: No evidence of inflammation or mass. Upper chest: Negative Review of the MIP images confirms the above findings CTA HEAD FINDINGS Anterior circulation: Atheromatous calcification of the bilateral carotid siphon. No large vessel occlusion, beading, or proximal stenosis. A left M3/4 branch occlusion is noted, as marked on sagittal reformats. Posterior circulation: Mild left vertebral artery dominance. Vertebral and basilar arteries are smooth and widely patent. Fetal type right PCA. No branch occlusion, beading, or aneurysm Venous sinuses: Diffusely patent Anatomic variants: As above Review of the MIP images confirms the above findings CT Brain Perfusion Findings: ASPECTS: 10 CBF (<30%) Volume: 35mL Perfusion (Tmax>6.0s) volume: 8mL Mismatch Volume: 40mL IMPRESSION: 1. Left M3/4 branch occlusion with 25 cc of ischemic range cerebral perfusion. 2. No emergent large vessel occlusion. 3. Atherosclerosis without proximal flow limiting stenosis. Electronically Signed   By: Monte Fantasia M.D.   On: 01/28/2021 11:25   CT Code Stroke CTA Neck W/WO  contrast  Result Date: 01/28/2021 CLINICAL DATA:  Facial droop.  Acute stroke suspected EXAM: CT ANGIOGRAPHY HEAD AND NECK CT PERFUSION BRAIN TECHNIQUE: Multidetector CT imaging of the head and neck was performed using the standard protocol during bolus administration of intravenous contrast. Multiplanar CT image reconstructions and MIPs were obtained to evaluate the vascular anatomy. Carotid stenosis measurements (when applicable) are obtained utilizing NASCET criteria, using the distal internal carotid diameter as the denominator. Multiphase CT imaging of the brain was performed following IV bolus contrast injection. Subsequent parametric perfusion maps were calculated using RAPID software. CONTRAST:  Dose is currently not known COMPARISON:  Head CT from earlier today FINDINGS: CTA NECK FINDINGS Aortic arch: Atheromatous plaque with 3 vessel branching. Right carotid system: Mild for age atheromatous plaque mainly at the bifurcation where there is mild proximal ICA narrowing mainly due to wasting rather than luminal plaque buildup. No ulceration. Left carotid system: Atheromatous wall thickening of the common carotid and proximal ICA, overall mild for age. No stenosis or ulceration. Vertebral arteries: No proximal subclavian stenosis. Mild left vertebral artery dominance. Scattered atheromatous calcifications of the vertebral arteries without flow limiting stenosis. No beading or dissection. Skeleton: No acute finding Other neck: No evidence of inflammation or mass. Upper chest: Negative Review of the MIP images confirms the above findings CTA HEAD FINDINGS Anterior circulation: Atheromatous calcification of the bilateral carotid siphon. No large vessel occlusion, beading, or proximal stenosis. A left M3/4 branch occlusion is noted, as marked on sagittal reformats. Posterior circulation: Mild left vertebral artery dominance. Vertebral and basilar arteries are smooth and widely patent. Fetal type right PCA. No  branch occlusion, beading, or aneurysm Venous sinuses: Diffusely patent Anatomic variants: As above Review of the MIP images confirms the above findings CT Brain Perfusion Findings: ASPECTS: 10 CBF (<30%) Volume: 42mL Perfusion (Tmax>6.0s) volume: 56mL Mismatch Volume: 63mL IMPRESSION: 1. Left M3/4 branch occlusion with 25 cc of ischemic range cerebral perfusion. 2. No emergent large vessel occlusion. 3. Atherosclerosis without proximal flow limiting stenosis. Electronically Signed  By: Monte Fantasia M.D.   On: 01/28/2021 11:25   CT Code Stroke Cerebral Perfusion with contrast  Result Date: 01/28/2021 CLINICAL DATA:  Facial droop.  Acute stroke suspected EXAM: CT ANGIOGRAPHY HEAD AND NECK CT PERFUSION BRAIN TECHNIQUE: Multidetector CT imaging of the head and neck was performed using the standard protocol during bolus administration of intravenous contrast. Multiplanar CT image reconstructions and MIPs were obtained to evaluate the vascular anatomy. Carotid stenosis measurements (when applicable) are obtained utilizing NASCET criteria, using the distal internal carotid diameter as the denominator. Multiphase CT imaging of the brain was performed following IV bolus contrast injection. Subsequent parametric perfusion maps were calculated using RAPID software. CONTRAST:  Dose is currently not known COMPARISON:  Head CT from earlier today FINDINGS: CTA NECK FINDINGS Aortic arch: Atheromatous plaque with 3 vessel branching. Right carotid system: Mild for age atheromatous plaque mainly at the bifurcation where there is mild proximal ICA narrowing mainly due to wasting rather than luminal plaque buildup. No ulceration. Left carotid system: Atheromatous wall thickening of the common carotid and proximal ICA, overall mild for age. No stenosis or ulceration. Vertebral arteries: No proximal subclavian stenosis. Mild left vertebral artery dominance. Scattered atheromatous calcifications of the vertebral arteries without  flow limiting stenosis. No beading or dissection. Skeleton: No acute finding Other neck: No evidence of inflammation or mass. Upper chest: Negative Review of the MIP images confirms the above findings CTA HEAD FINDINGS Anterior circulation: Atheromatous calcification of the bilateral carotid siphon. No large vessel occlusion, beading, or proximal stenosis. A left M3/4 branch occlusion is noted, as marked on sagittal reformats. Posterior circulation: Mild left vertebral artery dominance. Vertebral and basilar arteries are smooth and widely patent. Fetal type right PCA. No branch occlusion, beading, or aneurysm Venous sinuses: Diffusely patent Anatomic variants: As above Review of the MIP images confirms the above findings CT Brain Perfusion Findings: ASPECTS: 10 CBF (<30%) Volume: 34mL Perfusion (Tmax>6.0s) volume: 6mL Mismatch Volume: 60mL IMPRESSION: 1. Left M3/4 branch occlusion with 25 cc of ischemic range cerebral perfusion. 2. No emergent large vessel occlusion. 3. Atherosclerosis without proximal flow limiting stenosis. Electronically Signed   By: Monte Fantasia M.D.   On: 01/28/2021 11:25   CT HEAD CODE STROKE WO CONTRAST  Result Date: 01/28/2021 CLINICAL DATA:  Code stroke. Facial droop and altered mental status, side not specified EXAM: CT HEAD WITHOUT CONTRAST TECHNIQUE: Contiguous axial images were obtained from the base of the skull through the vertex without intravenous contrast. COMPARISON:  07/18/2015 FINDINGS: Brain: No evidence of acute infarction, hemorrhage, hydrocephalus, extra-axial collection or mass lesion/mass effect. Generalized atrophy. Moderate chronic small vessel ischemia in the cerebral white matter. Vascular: No definite hyperdense vessel. A left M2 branch appears prominent in density on coronal reformats but not on the other projections. Skull: Normal. Negative for fracture or focal lesion. Sinuses/Orbits: No acute finding. Other: These results were communicated to Dr. Curly Shores at  11:00 amon 1/30/2022by text page via the [facility] messaging system. ASPECTS [facility] Stroke Program Early CT Score) - Ganglionic level infarction (caudate, lentiform nuclei, internal capsule, insula, M1-M3 cortex): 7 - Supraganglionic infarction (M4-M6 cortex): 3 Total score (0-10 with 10 being normal): 10 IMPRESSION: 1. Aging brain without acute finding. 2. ASPECTS is 10 Electronically Signed   By: Monte Fantasia M.D.   On: 01/28/2021 11:02    Procedures .Critical Care Performed by: Margarita Mail, PA-C Authorized by: Margarita Mail, PA-C   Critical care provider statement:    Critical care time (minutes):  60  Critical care time was exclusive of:  Separately billable procedures and treating other patients   Critical care was necessary to treat or prevent imminent or life-threatening deterioration of the following conditions:  CNS failure or compromise   Critical care was time spent personally by me on the following activities:  Discussions with consultants, evaluation of patient's response to treatment, examination of patient, ordering and performing treatments and interventions, ordering and review of laboratory studies, ordering and review of radiographic studies, pulse oximetry, re-evaluation of patient's condition, obtaining history from patient or surrogate and review of old charts    Medications Ordered in ED Medications  sodium chloride 0.9 % bolus 250 mL (has no administration in time range)  iohexol (OMNIPAQUE) 350 MG/ML injection 100 mL (100 mLs Intravenous Contrast Given 01/28/21 1117)    ED Course  I have reviewed the triage vital signs and the nursing notes.  Pertinent labs & imaging results that were available during my care of the patient were reviewed by me and considered in my medical decision making (see chart for details).    MDM Rules/Calculators/A&P                          [id number] mental status VS:  Vitals:   01/28/21 1130 01/28/21 1200 01/28/21 1201  01/28/21 1205  BP: (!) 153/78 (!) 152/82  (!) 176/81  Pulse: 69 69  70  Resp: 14 13  19   SpO2: 97% 100%  99%  Weight:   92.3 kg   Height:   5' 10.5" (1.791 m)     KZ:SWFUXNA is gathered by patient's son by phone and EMS. Previous records obtained and reviewed. DDX:The patient's complaint of altered mental status involves an extensive number of diagnostic and treatment options, and is a complaint that carries with it a high risk of complications, morbidity, and potential mortality. Given the large differential diagnosis, medical decision making is of high complexity. The differential diagnosis for AMS is extensive and includes, but is not limited to: drug overdose - opioids, alcohol, sedatives, antipsychotics, drug withdrawal, others; Metabolic: hypoxia, hypoglycemia, hyperglycemia, hypercalcemia, hypernatremia, hyponatremia, uremia, hepatic encephalopathy, hypothyroidism, hyperthyroidism, vitamin B12 or thiamine deficiency, carbon monoxide poisoning, Wilson's disease, Lactic acidosis, DKA/HHOS; Infectious: meningitis, encephalitis, bacteremia/sepsis, urinary tract infection, pneumonia, neurosyphilis; Structural: Space-occupying lesion, (brain tumor, subdural hematoma, hydrocephalus,); Vascular: stroke, subarachnoid hemorrhage, coronary ischemia, hypertensive encephalopathy, CNS vasculitis, thrombotic thrombocytopenic purpura, disseminated intravascular coagulation, hyperviscosity; Psychiatric: Schizophrenia, depression; Other: Seizure, hypothermia, heat stroke, ICU psychosis, dementia -"sundowning."   Labs: I ordered reviewed and interpreted labs which include CBC which shows mildly low hemoglobin of insignificant value in my note mild thrombocytopenia.  UDS positive for barbiturates which the patient is prescribed.  The UA appears to be infected patient currently on antibiotics.  Ammonia within normal limits Imaging: I ordered and reviewed images which included portable 1 view chest x-ray, multiple  head CTs for evaluation of stroke. I independently visualized and interpreted all imaging. Significant findings include acute left MCA branch infarct (M3-M4). There are no acute, significant findings on t the patient's chest x-ray EKG: Junctional rhythm at a rate of 70 Consults: Dr. Brooke Dare neurohospitalist MDM: 87 year old male here with acute change in mental status.  We are unsure of the last seen normal.  The patient was well going to bed last night at 9:45 PM.'s morning at 7 AM his son heard him get up to go to the bathroom and get dressed.  At 9 AM he came  to get him for breakfast and the patient was unable to stand.  Had facial droop and was drooling.  The patient's son try to give him a lifesaver which fell out of his mouth. Patient symptoms have been somewhat waxing and waning down here.  I discussed the case with Dr. Roosevelt Locks who will admit the patient.  Dr. Curly Shores request the patient go to stepdown unit for every hour neurochecks. Patient disposition:The patient appears reasonably stabilized for admission considering the current resources, flow, and capabilities available in the ED at this time, and I doubt any other [facility] requiring further screening and/or treatment in the ED prior to admission.        Final Clinical Impression(s) / ED Diagnoses Final diagnoses:  Acute CVA (cerebrovascular accident) [facility])    Rx / [place] Orders ED Discharge Orders    None       Margarita Mail, PA-C 01/28/21 [place], MD 02/01/21 [phone]

## 2021-01-28 NOTE — H&P (Signed)
History and Physical    Douglas Walker [id number] DOB: 12/03/1925 DOA: 01/28/2021  PCP: Josetta Huddle, MD (Confirm with patient/family/NH records and if not entered, this has to be entered at [facility] point of entry) Patient coming from: Home  I have personally briefly reviewed patient's old medical records in [place]  Chief Complaint: AMS  HPI: Douglas Walker is a 87 y.o. male with medical history significant of paroxysmal A. Fib(not on systemic anticoagulation due to frequent falls),  peripheral neuropathy, essential tremor, SSS S/P DDDPPM, HTN, CKD stage IIIB, presented with altered mentations.  Patient confused, unable to provide any history, most history obtained from patient's son.  Son reported the patient was last seen at his baseline last night, this morning around 945, patient was found confused and unable to talk.  Also noticed some drooling and very wobbly when walking. ED Course: Code stroke called. CT head no significant finding, CTA showed occluded left M3/M4, but due to the size of the branch no intervention be considered.  Review of Systems: Unable to perform patient confused Past Medical History:  Diagnosis Date  . Arthritis    shoulders and back  . Cancer ([place])    skin cancers  . Chronic low back pain 12/15/2017  . CKD (chronic kidney disease) stage 3, GFR 30-59 ml/min (HCC)   . Coronary artery disease   . Dysrhythmia     Paroxysmal atrial fibrillation; Tachycardia-bradycardia syndrome  . Essential tremor 02/28/2016  . GERD (gastroesophageal reflux disease)   . Hernia   . History of hiatal hernia   . Hyperlipemia   . Hypertension   . Macular degeneration   . Neuromuscular disorder (HCC)    neuropathy  . Paroxysmal atrial fibrillation ([place]) 10/28/2013  . Presence of permanent cardiac pacemaker 12/04/2018  . Tachycardia-bradycardia syndrome ([place]) 09/13/2014  . TIA (transient ischemic attack)     Past Surgical History:  Procedure Laterality  Date  . APPENDECTOMY    . EYE SURGERY    . HERNIA REPAIR    . HERNIA REPAIR    . INSERT / REPLACE / REMOVE PACEMAKER  12/04/2018  . IRRIGATION AND DEBRIDEMENT ABSCESS Right 07/24/2020   Procedure: IRRIGATION AND DEBRIDEMENT HEMATOMA;  Surgeon: Coralie Keens, MD;  Location: [place];  Service: General;  Laterality: Right;  . MASS EXCISION Right 07/20/2020   Procedure: EXCISION OF RIGHT CHEST WALL MASS;  Surgeon: Stark Klein, MD;  Location: [place];  Service: General;  Laterality: Right;  . PACEMAKER IMPLANT N/A 12/04/2018   Procedure: PACEMAKER IMPLANT;  Surgeon: Evans Lance, MD;  Location: [place] [facility];  Service: Cardiovascular;  Laterality: N/A;     reports that he quit smoking about 58 years ago. He has never used smokeless tobacco. He reports that he does not drink alcohol and does not use drugs.  Allergies  Allergen Reactions  . Sulfa Antibiotics Other (See Comments)    Weakness, dizziness  . Primidone     Other reaction(s): increased sedation  . Simvastatin     Other reaction(s): arthralgias and fatigue, severe    Family History  Problem Relation Age of Onset  . GI problems Mother   . Other Sister        PAIN ISSUES  . Hearing loss Sister   . Blindness Sister   . Heart attack Neg Hx   . Stroke Neg Hx     Prior to Admission medications   Medication Sig Start Date End Date Taking? Authorizing Provider  acetaminophen (  TYLENOL) 500 MG tablet Take 1,000 mg by mouth 2 (two) times daily as needed for moderate pain.   Yes [provider]  aspirin EC 81 MG tablet Take 81 mg by mouth daily. Swallow whole.   Yes [provider]  beta carotene w/minerals (OCUVITE) tablet Take 1 tablet by mouth daily.    Yes [provider]  DULoxetine (CYMBALTA) 20 MG capsule Take 20 mg by mouth in the morning, at noon, and at bedtime.   Yes [provider]  finasteride (PROSCAR) 5 MG tablet Take 5 mg by mouth daily.   Yes [provider]   furosemide (LASIX) 20 MG tablet Take 60 mg by mouth daily.  11/27/19  Yes [provider]  levothyroxine (SYNTHROID) 25 MCG tablet TAKE 1 TABLET (25 MCG TOTAL) BY MOUTH DAILY BEFORE BREAKFAST. 05/17/19  Yes Belva Crome, MD  pregabalin (LYRICA) 25 MG capsule TAKE 1 CAPSULE(25 MG) BY MOUTH AT BEDTIME Patient taking differently: Take 25 mg by mouth daily. 10/26/20  Yes Suzzanne Cloud, NP  primidone (MYSOLINE) 250 MG tablet Take 250 mg by mouth 2 (two) times daily. 08/18/15  Yes [provider]  Tamsulosin HCl (FLOMAX) 0.4 MG CAPS Take 0.4 mg by mouth 2 (two) times daily.    Yes [provider]  topiramate (TOPAMAX) 50 MG tablet Take 1 tablet (50 mg total) by mouth 2 (two) times daily. 11/02/20  Yes Suzzanne Cloud, NP  vitamin B-12 (CYANOCOBALAMIN) 1000 MCG tablet Take 1,000 mcg by mouth every other day.   Yes [provider]    Physical Exam: Vitals:   01/28/21 1130 01/28/21 1200 01/28/21 1201 01/28/21 1205  BP: (!) 153/78 (!) 152/82  (!) 176/81  Pulse: 69 69  70  Resp: 14 13  19   SpO2: 97% 100%  99%  Weight:   92.3 kg   Height:   5' 10.5" (1.791 m)     Constitutional: NAD, calm, comfortable Vitals:   01/28/21 1130 01/28/21 1200 01/28/21 1201 01/28/21 1205  BP: (!) 153/78 (!) 152/82  (!) 176/81  Pulse: 69 69  70  Resp: 14 13  19   SpO2: 97% 100%  99%  Weight:   92.3 kg   Height:   5' 10.5" (1.791 m)    Eyes: PERRL, lids and conjunctivae normal ENMT: Mucous membranes are dry. Posterior pharynx clear of any exudate or lesions.Normal dentition.  Neck: normal, supple, no masses, no thyromegaly Respiratory: clear to auscultation bilaterally, no wheezing, no crackles. Normal respiratory effort. No accessory muscle use.  Cardiovascular: Regular rate and rhythm, no murmurs / rubs / gallops. No extremity edema. 2+ pedal pulses. No carotid bruits.  Abdomen: no tenderness, no masses palpated. No hepatosplenomegaly. Bowel sounds positive.  Musculoskeletal:  no clubbing / cyanosis. No joint deformity upper and lower extremities. Good ROM, no contractures. Normal muscle tone.  Skin: no rashes, lesions, ulcers. No induration Neurologic: No significant facial drooping, muscle strength appears to be symmetrical, not following commands.  Psychiatric: Confused, nonverbal.   Labs on Admission: I have personally reviewed following labs and imaging studies  CBC: Recent Labs  Lab 01/28/21 1045 01/28/21 1048  WBC 6.2  --   NEUTROABS 3.7  --   HGB 11.6* 11.9*  HCT 35.9* 35.0*  MCV 99.7  --   PLT 106*  --    Basic Metabolic Panel: Recent Labs  Lab 01/28/21 1045 01/28/21 1048  NA 139 141  K 3.9 3.9  CL 103 103  CO2 26  --  GLUCOSE 105* 97  BUN 34* 35*  CREATININE 1.54* 1.40*  CALCIUM 9.0  --    GFR: Estimated Creatinine Clearance: 38 mL/min (A) (by C-G formula based on SCr of 1.4 mg/dL (H)). Liver Function Tests: Recent Labs  Lab 01/28/21 1045  AST 21  ALT 26  ALKPHOS 120  BILITOT 0.7  PROT 6.7  ALBUMIN 3.6   No results for input(s): LIPASE, AMYLASE in the last 168 hours. Recent Labs  Lab 01/28/21 1046  AMMONIA 21   Coagulation Profile: Recent Labs  Lab 01/28/21 1045  INR 1.1   Cardiac Enzymes: No results for input(s): CKTOTAL, CKMB, CKMBINDEX, TROPONINI in the last 168 hours. BNP (last 3 results) No results for input(s): PROBNP in the last 8760 hours. HbA1C: No results for input(s): HGBA1C in the last 72 hours. CBG: Recent Labs  Lab 01/28/21 1032  GLUCAP 86   Lipid Profile: No results for input(s): CHOL, HDL, LDLCALC, TRIG, CHOLHDL, LDLDIRECT in the last 72 hours. Thyroid Function Tests: No results for input(s): TSH, T4TOTAL, FREET4, T3FREE, THYROIDAB in the last 72 hours. Anemia Panel: No results for input(s): VITAMINB12, FOLATE, FERRITIN, TIBC, IRON, RETICCTPCT in the last 72 hours. Urine analysis:    Component Value Date/Time   COLORURINE AMBER (A) 01/28/2021 1146   APPEARANCEUR TURBID (A) 01/28/2021  1146   LABSPEC 1.020 01/28/2021 1146   PHURINE 7.0 01/28/2021 1146   GLUCOSEU NEGATIVE 01/28/2021 1146   HGBUR NEGATIVE 01/28/2021 [place] 01/28/2021 [place] 01/28/2021 1146   PROTEINUR NEGATIVE 01/28/2021 1146   UROBILINOGEN 0.2 06/18/2015 0441   NITRITE NEGATIVE 01/28/2021 1146   LEUKOCYTESUR LARGE (A) 01/28/2021 1146    Radiological Exams on Admission: CT Code Stroke CTA Head W/WO contrast  Result Date: 01/28/2021 CLINICAL DATA:  Facial droop.  Acute stroke suspected EXAM: CT ANGIOGRAPHY HEAD AND NECK CT PERFUSION BRAIN TECHNIQUE: Multidetector CT imaging of the head and neck was performed using the standard protocol during bolus administration of intravenous contrast. Multiplanar CT image reconstructions and MIPs were obtained to evaluate the vascular anatomy. Carotid stenosis measurements (when applicable) are obtained utilizing NASCET criteria, using the distal internal carotid diameter as the denominator. Multiphase CT imaging of the brain was performed following IV bolus contrast injection. Subsequent parametric perfusion maps were calculated using RAPID software. CONTRAST:  Dose is currently not known COMPARISON:  Head CT from earlier today FINDINGS: CTA NECK FINDINGS Aortic arch: Atheromatous plaque with 3 vessel branching. Right carotid system: Mild for age atheromatous plaque mainly at the bifurcation where there is mild proximal ICA narrowing mainly due to wasting rather than luminal plaque buildup. No ulceration. Left carotid system: Atheromatous wall thickening of the common carotid and proximal ICA, overall mild for age. No stenosis or ulceration. Vertebral arteries: No proximal subclavian stenosis. Mild left vertebral artery dominance. Scattered atheromatous calcifications of the vertebral arteries without flow limiting stenosis. No beading or dissection. Skeleton: No acute finding Other neck: No evidence of inflammation or mass. Upper chest:  Negative Review of the MIP images confirms the above findings CTA HEAD FINDINGS Anterior circulation: Atheromatous calcification of the bilateral carotid siphon. No large vessel occlusion, beading, or proximal stenosis. A left M3/4 branch occlusion is noted, as marked on sagittal reformats. Posterior circulation: Mild left vertebral artery dominance. Vertebral and basilar arteries are smooth and widely patent. Fetal type right PCA. No branch occlusion, beading, or aneurysm Venous sinuses: Diffusely patent Anatomic variants: As above Review of the MIP images confirms the above  findings CT Brain Perfusion Findings: ASPECTS: 10 CBF (<30%) Volume: 83mL Perfusion (Tmax>6.0s) volume: 7mL Mismatch Volume: 48mL IMPRESSION: 1. Left M3/4 branch occlusion with 25 cc of ischemic range cerebral perfusion. 2. No emergent large vessel occlusion. 3. Atherosclerosis without proximal flow limiting stenosis. Electronically Signed   By: Monte Fantasia M.D.   On: 01/28/2021 11:25   DG Chest 1 View  Result Date: 01/28/2021 CLINICAL DATA:  Code stroke. EXAM: CHEST  1 VIEW COMPARISON:  Chest x-ray dated 08/24/2020 FINDINGS: Stable cardiomegaly. Overall cardiomediastinal silhouette is stable in size and configuration. LEFT chest wall pacemaker/ICD apparatus appears stable in position. Lungs are clear.  No pleural effusion or pneumothorax is seen. IMPRESSION: No active cardiopulmonary disease. No evidence of pneumonia or pulmonary edema. Stable cardiomegaly. Electronically Signed   By: Franki Cabot M.D.   On: 01/28/2021 11:46   CT Code Stroke CTA Neck W/WO contrast  Result Date: 01/28/2021 CLINICAL DATA:  Facial droop.  Acute stroke suspected EXAM: CT ANGIOGRAPHY HEAD AND NECK CT PERFUSION BRAIN TECHNIQUE: Multidetector CT imaging of the head and neck was performed using the standard protocol during bolus administration of intravenous contrast. Multiplanar CT image reconstructions and MIPs were obtained to evaluate the vascular  anatomy. Carotid stenosis measurements (when applicable) are obtained utilizing NASCET criteria, using the distal internal carotid diameter as the denominator. Multiphase CT imaging of the brain was performed following IV bolus contrast injection. Subsequent parametric perfusion maps were calculated using RAPID software. CONTRAST:  Dose is currently not known COMPARISON:  Head CT from earlier today FINDINGS: CTA NECK FINDINGS Aortic arch: Atheromatous plaque with 3 vessel branching. Right carotid system: Mild for age atheromatous plaque mainly at the bifurcation where there is mild proximal ICA narrowing mainly due to wasting rather than luminal plaque buildup. No ulceration. Left carotid system: Atheromatous wall thickening of the common carotid and proximal ICA, overall mild for age. No stenosis or ulceration. Vertebral arteries: No proximal subclavian stenosis. Mild left vertebral artery dominance. Scattered atheromatous calcifications of the vertebral arteries without flow limiting stenosis. No beading or dissection. Skeleton: No acute finding Other neck: No evidence of inflammation or mass. Upper chest: Negative Review of the MIP images confirms the above findings CTA HEAD FINDINGS Anterior circulation: Atheromatous calcification of the bilateral carotid siphon. No large vessel occlusion, beading, or proximal stenosis. A left M3/4 branch occlusion is noted, as marked on sagittal reformats. Posterior circulation: Mild left vertebral artery dominance. Vertebral and basilar arteries are smooth and widely patent. Fetal type right PCA. No branch occlusion, beading, or aneurysm Venous sinuses: Diffusely patent Anatomic variants: As above Review of the MIP images confirms the above findings CT Brain Perfusion Findings: ASPECTS: 10 CBF (<30%) Volume: 60mL Perfusion (Tmax>6.0s) volume: 4mL Mismatch Volume: 25mL IMPRESSION: 1. Left M3/4 branch occlusion with 25 cc of ischemic range cerebral perfusion. 2. No emergent  large vessel occlusion. 3. Atherosclerosis without proximal flow limiting stenosis. Electronically Signed   By: Monte Fantasia M.D.   On: 01/28/2021 11:25   CT Code Stroke Cerebral Perfusion with contrast  Result Date: 01/28/2021 CLINICAL DATA:  Facial droop.  Acute stroke suspected EXAM: CT ANGIOGRAPHY HEAD AND NECK CT PERFUSION BRAIN TECHNIQUE: Multidetector CT imaging of the head and neck was performed using the standard protocol during bolus administration of intravenous contrast. Multiplanar CT image reconstructions and MIPs were obtained to evaluate the vascular anatomy. Carotid stenosis measurements (when applicable) are obtained utilizing NASCET criteria, using the distal internal carotid diameter as the denominator. Multiphase CT imaging  of the brain was performed following IV bolus contrast injection. Subsequent parametric perfusion maps were calculated using RAPID software. CONTRAST:  Dose is currently not known COMPARISON:  Head CT from earlier today FINDINGS: CTA NECK FINDINGS Aortic arch: Atheromatous plaque with 3 vessel branching. Right carotid system: Mild for age atheromatous plaque mainly at the bifurcation where there is mild proximal ICA narrowing mainly due to wasting rather than luminal plaque buildup. No ulceration. Left carotid system: Atheromatous wall thickening of the common carotid and proximal ICA, overall mild for age. No stenosis or ulceration. Vertebral arteries: No proximal subclavian stenosis. Mild left vertebral artery dominance. Scattered atheromatous calcifications of the vertebral arteries without flow limiting stenosis. No beading or dissection. Skeleton: No acute finding Other neck: No evidence of inflammation or mass. Upper chest: Negative Review of the MIP images confirms the above findings CTA HEAD FINDINGS Anterior circulation: Atheromatous calcification of the bilateral carotid siphon. No large vessel occlusion, beading, or proximal stenosis. A left M3/4 branch  occlusion is noted, as marked on sagittal reformats. Posterior circulation: Mild left vertebral artery dominance. Vertebral and basilar arteries are smooth and widely patent. Fetal type right PCA. No branch occlusion, beading, or aneurysm Venous sinuses: Diffusely patent Anatomic variants: As above Review of the MIP images confirms the above findings CT Brain Perfusion Findings: ASPECTS: 10 CBF (<30%) Volume: 73mL Perfusion (Tmax>6.0s) volume: 51mL Mismatch Volume: 63mL IMPRESSION: 1. Left M3/4 branch occlusion with 25 cc of ischemic range cerebral perfusion. 2. No emergent large vessel occlusion. 3. Atherosclerosis without proximal flow limiting stenosis. Electronically Signed   By: Monte Fantasia M.D.   On: 01/28/2021 11:25   CT HEAD CODE STROKE WO CONTRAST  Result Date: 01/28/2021 CLINICAL DATA:  Code stroke. Facial droop and altered mental status, side not specified EXAM: CT HEAD WITHOUT CONTRAST TECHNIQUE: Contiguous axial images were obtained from the base of the skull through the vertex without intravenous contrast. COMPARISON:  07/18/2015 FINDINGS: Brain: No evidence of acute infarction, hemorrhage, hydrocephalus, extra-axial collection or mass lesion/mass effect. Generalized atrophy. Moderate chronic small vessel ischemia in the cerebral white matter. Vascular: No definite hyperdense vessel. A left M2 branch appears prominent in density on coronal reformats but not on the other projections. Skull: Normal. Negative for fracture or focal lesion. Sinuses/Orbits: No acute finding. Other: These results were communicated to Dr. Curly Shores at 11:00 amon 1/30/2022by text page via the [facility] messaging system. ASPECTS [facility] Stroke Program Early CT Score) - Ganglionic level infarction (caudate, lentiform nuclei, internal capsule, insula, M1-M3 cortex): 7 - Supraganglionic infarction (M4-M6 cortex): 3 Total score (0-10 with 10 being normal): 10 IMPRESSION: 1. Aging brain without acute finding. 2. ASPECTS is 10  Electronically Signed   By: Monte Fantasia M.D.   On: 01/28/2021 11:02    EKG: Independently reviewed. Paced (reported as junctional rhythm)  Assessment/Plan Active Problems:   CVA (cerebral vascular accident) ([place])  (please populate well all problems here in Problem List. (For example, if patient is on BP meds at home and you resume or decide to hold them, it is a problem that needs to be her. Same for CAD, COPD, HLD and so on)  CVA -Doubt he can cooperate/tolerate an MRI today, consider MRI tomorrow or at least until patient is more oriented. -ASA -A1c and lipid panel -Allow permissive hypertension -N.p.o. and IV fluid for now until cleared by speech evaluation.  Paroxysmal A. Fib -Ventricular paced -Not on anticoagulation due to frequent falls  HTN -Allow permissive hypertension  Essential tremor -Continue  Topamax and Lyrica  CKD 3B -Creatinine level stable, euvolemic   DVT prophylaxis: Heparin subcu  code Status: DNR Family Communication: Son Disposition Plan: Expect more than 2 midnight hospital stay for stroke work-up and PT evaluation. Consults called: Neurology Admission status: PCU as per request from neurology for frequent neuro checks.   Lequita Halt MD [facility] Pager [phone]  01/28/2021, 12:50 PM

## 2021-01-28 NOTE — ED Notes (Signed)
Spoke with son on phone, [name]-- would like to be notified when pt moves to a room.  [phone]  Pt is visually impaired -- completely blind in Left eye, per son.

## 2021-01-28 NOTE — ED Notes (Signed)
Pt frustrated and upset because he wasn't able to speak. Placed on bedpan for BM.

## 2021-01-28 NOTE — Progress Notes (Signed)
  Echocardiogram 2D Echocardiogram has been performed.  Douglas Walker 01/28/2021, 2:01 PM

## 2021-01-29 DIAGNOSIS — I5032 Chronic diastolic (congestive) heart failure: Secondary | ICD-10-CM | POA: Diagnosis not present

## 2021-01-29 LAB — LIPID PANEL
Cholesterol: 188 mg/dL (ref 0–200)
HDL: 43 mg/dL (ref >40)
LDL Cholesterol: 126 mg/dL — ABNORMAL HIGH (ref 0–99)
Total CHOL/HDL Ratio: 4.4 RATIO
Triglycerides: 96 mg/dL (ref <150)
VLDL: 19 mg/dL (ref 0–40)

## 2021-01-29 LAB — HEMOGLOBIN A1C
Hgb A1c MFr Bld: 5.3 % (ref 4.8–5.6)
Mean Plasma Glucose: 105.41 mg/dL

## 2021-01-29 LAB — BRAIN NATRIURETIC PEPTIDE: B Natriuretic Peptide: 391.1 pg/mL — ABNORMAL HIGH (ref 0.0–100.0)

## 2021-01-29 NOTE — Progress Notes (Signed)
STROKE TEAM PROGRESS NOTE   INTERVAL HISTORY No acute events since arrival.  Patient was transferred from ED to 3W this am. He is resting quietly. His MRI was not obtained due to confusion and agitation per chart review.  He is calm and cooperative this morning. HOH. Follows loudly spoken commands . Severe expressive aphasia limits ROS and ability to discuss current status, stroke work up and diagnosis.  He moves all 4 extremities against gravity.  Vitals:   01/29/21 0515 01/29/21 0530 01/29/21 0700 01/29/21 0715  BP: (!) 143/75 (!) 142/70 (!) 144/69 134/66  Pulse: 70 70 70 70  Resp: 18 19 10 11   Temp:      TempSrc:      SpO2: 91% 91% 93% 92%  Weight:      Height:       CBC:  Recent Labs  Lab 01/28/21 1045 01/28/21 1048  WBC 6.2  --   NEUTROABS 3.7  --   HGB 11.6* 11.9*  HCT 35.9* 35.0*  MCV 99.7  --   PLT 106*  --    Basic Metabolic Panel:  Recent Labs  Lab 01/28/21 1045 01/28/21 1048  NA 139 141  K 3.9 3.9  CL 103 103  CO2 26  --   GLUCOSE 105* 97  BUN 34* 35*  CREATININE 1.54* 1.40*  CALCIUM 9.0  --    Lipid Panel:  Recent Labs  Lab 01/29/21 0237  CHOL 188  TRIG 96  HDL 43  CHOLHDL 4.4  VLDL 19  LDLCALC 126*   HgbA1c:  Recent Labs  Lab 01/29/21 0237  HGBA1C 5.3   Urine Drug Screen:  Recent Labs  Lab 01/28/21 1146  LABOPIA NONE DETECTED  COCAINSCRNUR NONE DETECTED  LABBENZ NONE DETECTED  AMPHETMU NONE DETECTED  THCU NONE DETECTED  LABBARB POSITIVE*    Alcohol Level  Recent Labs  Lab 01/28/21 1045  ETH <10   01/28/21 Ammonia 9 - 35 umol/L 21    01/18/21 Component Ref Range & Units 1 d ago  (01/28/21) 6 mo ago  (07/27/20) 6 mo ago  (07/24/20) 6 mo ago  (07/17/20)  SARS Coronavirus 2 NEGATIVE NEGATIVE  NEGATIVE CM  NEGATIVE CM  NEGATIVE     Component Ref Range & Units 02:37 5 mo ago  B Natriuretic Peptide 0.0 - 100.0 pg/mL 391.1High  341.4High     IMAGING past 24 hours CT Code Stroke CTA Head W/WO contrast  Result Date:  01/28/2021 CLINICAL DATA:  Facial droop.  Acute stroke suspected EXAM: CT ANGIOGRAPHY HEAD AND NECK CT PERFUSION BRAIN TECHNIQUE: Multidetector CT imaging of the head and neck was performed using the standard protocol during bolus administration of intravenous contrast. Multiplanar CT image reconstructions and MIPs were obtained to evaluate the vascular anatomy. Carotid stenosis measurements (when applicable) are obtained utilizing NASCET criteria, using the distal internal carotid diameter as the denominator. Multiphase CT imaging of the brain was performed following IV bolus contrast injection. Subsequent parametric perfusion maps were calculated using RAPID software. CONTRAST:  Dose is currently not known COMPARISON:  Head CT from earlier today FINDINGS: CTA NECK FINDINGS Aortic arch: Atheromatous plaque with 3 vessel branching. Right carotid system: Mild for age atheromatous plaque mainly at the bifurcation where there is mild proximal ICA narrowing mainly due to wasting rather than luminal plaque buildup. No ulceration. Left carotid system: Atheromatous wall thickening of the common carotid and proximal ICA, overall mild for age. No stenosis or ulceration. Vertebral arteries: No proximal subclavian stenosis. Mild  left vertebral artery dominance. Scattered atheromatous calcifications of the vertebral arteries without flow limiting stenosis. No beading or dissection. Skeleton: No acute finding Other neck: No evidence of inflammation or mass. Upper chest: Negative Review of the MIP images confirms the above findings CTA HEAD FINDINGS Anterior circulation: Atheromatous calcification of the bilateral carotid siphon. No large vessel occlusion, beading, or proximal stenosis. A left M3/4 branch occlusion is noted, as marked on sagittal reformats. Posterior circulation: Mild left vertebral artery dominance. Vertebral and basilar arteries are smooth and widely patent. Fetal type right PCA. No branch occlusion, beading,  or aneurysm Venous sinuses: Diffusely patent Anatomic variants: As above Review of the MIP images confirms the above findings CT Brain Perfusion Findings: ASPECTS: 10 CBF (<30%) Volume: 24mL Perfusion (Tmax>6.0s) volume: 95mL Mismatch Volume: 12mL IMPRESSION: 1. Left M3/4 branch occlusion with 25 cc of ischemic range cerebral perfusion. 2. No emergent large vessel occlusion. 3. Atherosclerosis without proximal flow limiting stenosis. Electronically Signed   By: Monte Fantasia M.D.   On: 01/28/2021 11:25   DG Chest 1 View  Result Date: 01/28/2021 CLINICAL DATA:  Code stroke. EXAM: CHEST  1 VIEW COMPARISON:  Chest x-ray dated 08/24/2020 FINDINGS: Stable cardiomegaly. Overall cardiomediastinal silhouette is stable in size and configuration. LEFT chest wall pacemaker/ICD apparatus appears stable in position. Lungs are clear.  No pleural effusion or pneumothorax is seen. IMPRESSION: No active cardiopulmonary disease. No evidence of pneumonia or pulmonary edema. Stable cardiomegaly. Electronically Signed   By: Franki Cabot M.D.   On: 01/28/2021 11:46   CT Code Stroke CTA Neck W/WO contrast  Result Date: 01/28/2021 CLINICAL DATA:  Facial droop.  Acute stroke suspected EXAM: CT ANGIOGRAPHY HEAD AND NECK CT PERFUSION BRAIN TECHNIQUE: Multidetector CT imaging of the head and neck was performed using the standard protocol during bolus administration of intravenous contrast. Multiplanar CT image reconstructions and MIPs were obtained to evaluate the vascular anatomy. Carotid stenosis measurements (when applicable) are obtained utilizing NASCET criteria, using the distal internal carotid diameter as the denominator. Multiphase CT imaging of the brain was performed following IV bolus contrast injection. Subsequent parametric perfusion maps were calculated using RAPID software. CONTRAST:  Dose is currently not known COMPARISON:  Head CT from earlier today FINDINGS: CTA NECK FINDINGS Aortic arch: Atheromatous plaque with 3  vessel branching. Right carotid system: Mild for age atheromatous plaque mainly at the bifurcation where there is mild proximal ICA narrowing mainly due to wasting rather than luminal plaque buildup. No ulceration. Left carotid system: Atheromatous wall thickening of the common carotid and proximal ICA, overall mild for age. No stenosis or ulceration. Vertebral arteries: No proximal subclavian stenosis. Mild left vertebral artery dominance. Scattered atheromatous calcifications of the vertebral arteries without flow limiting stenosis. No beading or dissection. Skeleton: No acute finding Other neck: No evidence of inflammation or mass. Upper chest: Negative Review of the MIP images confirms the above findings CTA HEAD FINDINGS Anterior circulation: Atheromatous calcification of the bilateral carotid siphon. No large vessel occlusion, beading, or proximal stenosis. A left M3/4 branch occlusion is noted, as marked on sagittal reformats. Posterior circulation: Mild left vertebral artery dominance. Vertebral and basilar arteries are smooth and widely patent. Fetal type right PCA. No branch occlusion, beading, or aneurysm Venous sinuses: Diffusely patent Anatomic variants: As above Review of the MIP images confirms the above findings CT Brain Perfusion Findings: ASPECTS: 10 CBF (<30%) Volume: 79mL Perfusion (Tmax>6.0s) volume: 50mL Mismatch Volume: 48mL IMPRESSION: 1. Left M3/4 branch occlusion with 25 cc of ischemic  range cerebral perfusion. 2. No emergent large vessel occlusion. 3. Atherosclerosis without proximal flow limiting stenosis. Electronically Signed   By: Monte Fantasia M.D.   On: 01/28/2021 11:25   CT Code Stroke Cerebral Perfusion with contrast  Result Date: 01/28/2021 CLINICAL DATA:  Facial droop.  Acute stroke suspected EXAM: CT ANGIOGRAPHY HEAD AND NECK CT PERFUSION BRAIN TECHNIQUE: Multidetector CT imaging of the head and neck was performed using the standard protocol during bolus administration of  intravenous contrast. Multiplanar CT image reconstructions and MIPs were obtained to evaluate the vascular anatomy. Carotid stenosis measurements (when applicable) are obtained utilizing NASCET criteria, using the distal internal carotid diameter as the denominator. Multiphase CT imaging of the brain was performed following IV bolus contrast injection. Subsequent parametric perfusion maps were calculated using RAPID software. CONTRAST:  Dose is currently not known COMPARISON:  Head CT from earlier today FINDINGS: CTA NECK FINDINGS Aortic arch: Atheromatous plaque with 3 vessel branching. Right carotid system: Mild for age atheromatous plaque mainly at the bifurcation where there is mild proximal ICA narrowing mainly due to wasting rather than luminal plaque buildup. No ulceration. Left carotid system: Atheromatous wall thickening of the common carotid and proximal ICA, overall mild for age. No stenosis or ulceration. Vertebral arteries: No proximal subclavian stenosis. Mild left vertebral artery dominance. Scattered atheromatous calcifications of the vertebral arteries without flow limiting stenosis. No beading or dissection. Skeleton: No acute finding Other neck: No evidence of inflammation or mass. Upper chest: Negative Review of the MIP images confirms the above findings CTA HEAD FINDINGS Anterior circulation: Atheromatous calcification of the bilateral carotid siphon. No large vessel occlusion, beading, or proximal stenosis. A left M3/4 branch occlusion is noted, as marked on sagittal reformats. Posterior circulation: Mild left vertebral artery dominance. Vertebral and basilar arteries are smooth and widely patent. Fetal type right PCA. No branch occlusion, beading, or aneurysm Venous sinuses: Diffusely patent Anatomic variants: As above Review of the MIP images confirms the above findings CT Brain Perfusion Findings: ASPECTS: 10 CBF (<30%) Volume: 50mL Perfusion (Tmax>6.0s) volume: 89mL Mismatch Volume: 41mL  IMPRESSION: 1. Left M3/4 branch occlusion with 25 cc of ischemic range cerebral perfusion. 2. No emergent large vessel occlusion. 3. Atherosclerosis without proximal flow limiting stenosis. Electronically Signed   By: Monte Fantasia M.D.   On: 01/28/2021 11:25   ECHOCARDIOGRAM COMPLETE  Result Date: 01/28/2021    ECHOCARDIOGRAM REPORT   Patient Name:   Douglas Walker Date of Exam: 01/28/2021 Medical Rec #:  [id number]          Height:       70.5 in Accession #:    [id number]         Weight:       203.5 lb Date of Birth:  1925/04/09          BSA:          2.114 m Patient Age:    87 years           BP:           176/81 mmHg Patient Gender: M                  HR:           70 bpm. Exam Location:  Inpatient Procedure: 2D Echo Indications:    TIA 435.9  History:        Patient has prior history of Echocardiogram examinations, most  recent 05/19/2015. Chronic kidney disease, Arrythmias:Atrial                 Fibrillation; Risk Factors:Hypertension.  Sonographer:    Johny Chess Referring Phys: [id number] PING T ZHANG  Sonographer Comments: Image acquisition challenging due to uncooperative patient. IMPRESSIONS  1. Left ventricular ejection fraction, by estimation, is 50 to 55%. The left ventricle has low normal function. The left ventricle has no regional wall motion abnormalities. Left ventricular diastolic function could not be evaluated.  2. Right ventricular systolic function is mildly reduced. The right ventricular size is normal. There is moderately elevated pulmonary artery systolic pressure.  3. Left atrial size was moderately dilated.  4. Right atrial size was moderately dilated.  5. The mitral valve is normal in structure. No evidence of mitral valve regurgitation.  6. The aortic valve is tricuspid. Aortic valve regurgitation is mild. Mild aortic valve sclerosis is present, with no evidence of aortic valve stenosis.  7. Aortic dilatation noted. There is moderate dilatation of the ascending  aorta, measuring 47 mm.  8. The inferior vena cava is dilated in size with <50% respiratory variability, suggesting right atrial pressure of 15 mmHg. Comparison(s): Prior images unable to be directly viewed, comparison made by report only. Ascending aorta dilation has worsened since 2019 (but measurement is similar to CT angio report from may 2021). FINDINGS  Left Ventricle: Left ventricular ejection fraction, by estimation, is 50 to 55%. The left ventricle has low normal function. The left ventricle has no regional wall motion abnormalities. The left ventricular internal cavity size was normal in size. There is no left ventricular hypertrophy. Abnormal (paradoxical) septal motion, consistent with RV pacemaker. Left ventricular diastolic function could not be evaluated due to atrial fibrillation. Left ventricular diastolic function could not be evaluated. Right Ventricle: The right ventricular size is normal. No increase in right ventricular wall thickness. Right ventricular systolic function is mildly reduced. There is moderately elevated pulmonary artery systolic pressure. The tricuspid regurgitant velocity is 2.76 m/s, and with an assumed right atrial pressure of 15 mmHg, the estimated right ventricular systolic pressure is [id number] mmHg. Left Atrium: Left atrial size was moderately dilated. Right Atrium: Right atrial size was moderately dilated. Pericardium: There is no evidence of pericardial effusion. Mitral Valve: The mitral valve is normal in structure. No evidence of mitral valve regurgitation. Tricuspid Valve: The tricuspid valve is normal in structure. Tricuspid valve regurgitation is mild. Aortic Valve: The aortic valve is tricuspid. Aortic valve regurgitation is mild. Mild aortic valve sclerosis is present, with no evidence of aortic valve stenosis. Pulmonic Valve: The pulmonic valve was not well visualized. Pulmonic valve regurgitation is not visualized. Aorta: Aortic dilatation noted. There is moderate  dilatation of the ascending aorta, measuring 47 mm. Venous: The inferior vena cava is dilated in size with less than 50% respiratory variability, suggesting right atrial pressure of 15 mmHg. IAS/Shunts: No atrial level shunt detected by color flow Doppler. Additional Comments: A pacer wire is visualized.  LEFT VENTRICLE PLAX 2D LVIDd:         4.70 cm Diastology LVIDs:         3.40 cm LV e' medial:    5.87 cm/s LV PW:         1.30 cm LV E/e' medial:  21.0 LV IVS:        1.00 cm LV e' lateral:   10.40 cm/s  LV E/e' lateral: 11.8  RIGHT VENTRICLE            IVC RV S prime:     8.38 cm/s  IVC diam: 2.40 cm TAPSE (M-mode): 1.1 cm LEFT ATRIUM             Index       RIGHT ATRIUM           Index LA diam:        4.20 cm 1.99 cm/m  RA Area:     16.00 cm LA Vol (A2C):   63.8 ml 30.18 ml/m RA Volume:   41.30 ml  19.54 ml/m LA Vol (A4C):   67.7 ml 32.03 ml/m LA Biplane Vol: 65.7 ml 31.08 ml/m  AORTIC VALVE LVOT Vmax:   97.90 cm/s LVOT Vmean:  62.500 cm/s LVOT VTI:    0.228 m  AORTA Ao Asc diam: 4.70 cm MITRAL VALVE                TRICUSPID VALVE MV Area (PHT): 3.60 cm     TR Peak grad:   30.5 mmHg MV Decel Time: 211 msec     TR Vmax:        276.00 cm/s MV E velocity: 123.00 cm/s MV A velocity: 33.10 cm/s   SHUNTS MV E/A ratio:  3.72         Systemic VTI: 0.23 m Dani Gobble Croitoru MD Electronically signed by Sanda Klein MD Signature Date/Time: 01/28/2021/2:10:39 PM    Final    CT HEAD CODE STROKE WO CONTRAST  Result Date: 01/28/2021 CLINICAL DATA:  Code stroke. Facial droop and altered mental status, side not specified EXAM: CT HEAD WITHOUT CONTRAST TECHNIQUE: Contiguous axial images were obtained from the base of the skull through the vertex without intravenous contrast. COMPARISON:  07/18/2015 FINDINGS: Brain: No evidence of acute infarction, hemorrhage, hydrocephalus, extra-axial collection or mass lesion/mass effect. Generalized atrophy. Moderate chronic small vessel ischemia in the cerebral  white matter. Vascular: No definite hyperdense vessel. A left M2 branch appears prominent in density on coronal reformats but not on the other projections. Skull: Normal. Negative for fracture or focal lesion. Sinuses/Orbits: No acute finding. Other: These results were communicated to Dr. Curly Shores at 11:00 amon 1/30/2022by text page via the [facility] messaging system. ASPECTS [facility] Stroke Program Early CT Score) - Ganglionic level infarction (caudate, lentiform nuclei, internal capsule, insula, M1-M3 cortex): 7 - Supraganglionic infarction (M4-M6 cortex): 3 Total score (0-10 with 10 being normal): 10 IMPRESSION: 1. Aging brain without acute finding. 2. ASPECTS is 10 Electronically Signed   By: Monte Fantasia M.D.   On: 01/28/2021 11:02   PHYSICAL EXAM GENERAL: Pleasant elderly Caucasian male not in distress., tremor present in bilateral upper extremities, head, and neck.   HEAD: - Normocephalic and atraumatic EENT: wears eyeglasses at baseline, normal conjunctiva, no OP obstruction. Dry mm. Hearing-aids in place bilaterally LUNGS - Normal respiratory effort without labored breathing CV - regular rate and rhythm noted on cardiac monitor ABDOMEN - Soft, non-tender, non-distended Ext: warm, well perfused  NEURO:  Mental Status: alert, speech is aphasic, one word answers occasionally with incomprehensible speech when he attempts to vocalize. Decreased vocalization. Follows loudly spoken commands intermittently Speech/Language: Broca's aphasia- garbled, mostly moans with vocalization. Does not produce any comprehensible speech. Cranial Nerves:  II: PERRL 2 mm -> 1 mm. Attends to stimuli in all visual fields.  III, IV, VI: EOMI without ptosis. (Initially with leftward gaze palsy, unable to cross midline. After CT, gaze palsy  had resolved and EOMs were noted to be fully intact)  V: Blinks to threat inconsistently in all visual fields --legally blind at baseline though he does have some vision. VII: Face is  asymmetric with partial paresis of right mouth and nasolabial fold initially; stuttering periodically with resolution and recurrence during examination.   VIII: Hard of hearing at baseline but intact to loud voice with hearing-aids. IX, X: Palate elevation is symmetric. XI: Normal sternocleidomastoid and trapezius muscle strength XII: Tongue protrudes midline Motor: 5/5 strength in bilateral upper and lower extremities with multiple attempts; limited assessment initially due to hard of hearing without hearing- . Antigravity movement present in all extremities without pronator drift. Essential tremor is noted in bilateral upper extremities right more than left, neck, and head, and face increasing with intention- this is baseline for patient. Tone is normal. Bulk is normal.  Sensation- Patient does not answer questions about sensation lateralization/quality.  Coordination: Patient does not follow complex commands; unable to assess coordination with FNF, HKS. DTRs: 2+ bilateral upper extremities biceps and brachioradialis, unable to obtain patellae reflexes. Gait- deferred but patient able to stand and balance with minimal assistance.  ASSESSMENT/PLAN: FLEMING MATEL is a 87 y.o. male with medical history significant of paroxysmal A. Fib (not on systemic anticoagulation due to frequent falls), baseline hearing and visual deficits, peripheral neuropathy, essential tremor, SSS S/P DDDPPM (2019), HTN, CKD stage IIIB, CHF presented with altered mental status.  Patient was found confused and aphasic with drooling and impaired balance.   Left M3/M4 superior division MCA occlusion, cardioembolic in the setting of A. fib not on anticoagulation  Code Stroke: CT head No acute abnormality.   CTA head & neck: 1. Left M3/4 branch occlusion with 25 cc of ischemic range cerebral perfusion. 2. No emergent large vessel occlusion. 3. Atherosclerosis without proximal flow limiting stenosis.  CT perfusion:   1. Left M3/4 branch occlusion with 25 cc of ischemic range cerebral perfusion. 2. No emergent large vessel occlusion. 3. Atherosclerosis without proximal flow limiting stenosis  2D Echo:EF by estimation, is 50 to 55%.Left atrial size was moderately dilated. Right atrial size was moderately dilated  MRI PENDING plan-was not obtained due to confusion and agitation and also has Pacemaker   LDL 126  HgbA1c 5.3  Swallow eval is PENDING  VTE prophylaxis - Is recommendedNo AC prior to admission in setting of known afib due to one episode of hematoma on back and frequent falls  ASA 81 mg daily po if he passes swallow eval   Therapy recommendations:  TBD  Disposition:  TBD  Hypertension  Home meds:  None (stopped for several months with family monitoring daily) . Permissive hypertension (OK if < 220/120) but gradually normalize in 5-7 days . Long-term BP goal normotensive  Hyperlipidemia  Home meds:  None  LDL 126, goal < 70  Pending swallow eval  Other Stroke Risk Factors  Advanced Age >/= 42   CHF, Diastolic, listed [id number]  History of TIA  He quit smoking 58 years ago   Other Active Problems Per Dr. Lyn Records admission consultation: #Goals of Care: "Mr. Steinberg, and he has been very clear that he would not want aggressive end-of-life measures and would prefer a Palliative and comfort care route, including the fact that he would not want a feeding tube."   #Possible UTI Large leukocytes and rare bacteria on UA yesterday.  Monitor and consider sending culture   I have personally obtained history,examined this patient, reviewed notes, independently viewed imaging  studies, participated in medical decision making and plan of care.ROS completed by me personally and pertinent positives fully documented  I have made any additions or clarifications directly to the above note. Agree with note above.  Patient has history of A. fib but has not been on anticoagulation due to being a  fall risk and his advanced age.Marland Kitchen  He presented with aphasia secondary to embolic left MCA branch infarct from his atrial fibrillation.  Continue aspirin given significant risk of hemorrhage being fall risk and baseline.  Continue ongoing stroke work-up.  Speech therapy for swallow eval.  Patient's has been previously quite clear in his goals that he would not want [place] feeding tube and palliative and comfort care route. Recommend discussion with family goals of care if patient is unable to swallow safely soon palliative care approach may be reasonable.  Greater than 50% time during this 35-minute visit was spent in counseling and coordination of care and discussion with care team.  Discussed with [facility] Antony Contras, MD Medical Director [place] Pager: [phone] 01/29/2021 4:16 PM   Hospital day # 1  To contact Stroke Continuity provider, please refer to [email or web address] After hours, contact General Neurology

## 2021-01-29 NOTE — ED Notes (Signed)
Attempted to call nursing report to 3W 

## 2021-01-29 NOTE — Progress Notes (Signed)
Inpatient Rehab Admissions Coordinator Note:   Per therapy recommendations, pt was screened for CIR candidacy by Clemens Catholic, [place] CCC-SLP. At this time, Pt. Appears to have functional decline and is a good candidate for CIR. Will place order for rehab consult per protocol.  Please contact me with questions.  Of note, Pt. Is ambulating at min A and is supervision-mod I for some ADLs. If Pt. Progresses with therapies during this acute stay, Pt. May not continue to qualify for CIR.    Clemens Catholic, [place], [place] Admissions Coordinator  [phone] ([place]) [phone] (office)

## 2021-01-29 NOTE — ED Notes (Signed)
Assisted pt with eating breakfast. Pt took a few bites of food and sips of water.

## 2021-01-29 NOTE — ED Notes (Signed)
Breakfast ordered 

## 2021-01-29 NOTE — Evaluation (Signed)
Physical Therapy Evaluation Patient Details Name: Douglas Walker MRN: [id number] DOB: Aug 26, 1925 Today's Date: 01/29/2021   History of Present Illness  87 y.o. male presenting with AMS and difficulty ambulating. CTA (+) occluded left M3/M4. MRI pending. PMHx significant for A-fib, peripheral neuropathy, essential tremor, HTN, CKD stage IIIb, and Hx of falls.  Clinical Impression   Patient received in recliner, son present and assisted in providing PLOF and information about mobility during session. Able to mobilize on a MinA basis with RW, however does demonstrate some impairment in general strength, coordination, and gait; I also suspect possible R sided visual impairment as he repeatedly ran into objects in the room and hallway on the right side and needed MinA/Max cues to avoid them during ambulation. Had a hard time getting him to turn his head to attend to the right side, but he was able to actively track me by turning his head to the right. Difficult to assess cognition today due to aphasia and HOH, also very hard to accurately assess strength, RAM, and sensation for the same reasons. Able to intermittently follow simple, rote commands such as " Douglas Walker, stand up" but had a hard time with sequencing and problem solving through novel tasks. Left up in recliner with all needs met and son providing direct supervision. Education provided to both patient and son about possible effects of CVA as well as how to communicate with aphasia to avoid frustrating the patient, also PT POC moving forward. Would benefit from intensive therapies in the CIR setting moving forward.     Follow Up Recommendations CIR;Supervision/Assistance - 24 hour    Equipment Recommendations  Rolling walker with 5" wheels;3in1 (PT)    Recommendations for Other Services       Precautions / Restrictions Precautions Precautions: Fall Precaution Comments: Aphasic, HOH, legally blind in L eye, possible R inattention and/or  visual impairment Restrictions Weight Bearing Restrictions: No      Mobility  Bed Mobility Overal bed mobility: Needs Assistance Bed Mobility: Supine to Sit     Supine to sit: Min guard     General bed mobility comments: OOB in recliner upon entry    Transfers Overall transfer level: Needs assistance Equipment used: Rolling walker (2 wheeled) Transfers: Sit to/from Stand Sit to Stand: Min assist Stand pivot transfers: Min guard       General transfer comment: MinA to boost to standing with RW, good carryover of hand placement form OT session; condom cath immediately slid off onto the floor when we stood up  Ambulation/Gait Ambulation/Gait assistance: Herbalist (Feet): 80 Feet Assistive device: Rolling walker (2 wheeled) Gait Pattern/deviations: Step-through pattern;Decreased step length - right;Decreased step length - left;Narrow base of support;Trendelenburg;Drifts right/left;Trunk flexed Gait velocity: decreased   General Gait Details: slow with RW, able to maintain safe proximity from device but with strong drift to the right; ran into obstacles in the hallway multiple times and was surprised (question R sided vision impairment post-CVA) and needed MinA/Max cues to avoid running into other obstacles on his right side. VSS on RA.  Stairs            Wheelchair Mobility    Modified Rankin (Stroke Patients Only)       Balance Overall balance assessment: Needs assistance Sitting-balance support: No upper extremity supported;Feet supported Sitting balance-Leahy Scale: Fair Sitting balance - Comments: Patient able to maintain static sitting at EOB without LOB. Unable to doff/don footwear without external assist.   Standing balance support: Bilateral upper  extremity supported;During functional activity Standing balance-Leahy Scale: Poor Standing balance comment: Reliant on BUE on RW.                             Pertinent  Vitals/Pain Pain Assessment: Faces Faces Pain Scale: Hurts a little bit Pain Location: generalized Pain Descriptors / Indicators: Discomfort;Sore Pain Intervention(s): Limited activity within patient's tolerance;Monitored during session;Repositioned    Home Living Family/patient expects to be discharged to:: Private residence Living Arrangements: Children Available Help at Discharge: Family;Available 24 hours/day Type of Home: House Home Access: Level entry     Home Layout: One level Home Equipment: Shower seat;Grab bars - toilet;Walker - 2 wheels;Cane - single point;Bedside commode;Grab bars - tub/shower;Hand held shower head;Wheelchair - manual      Prior Function Level of Independence: Needs assistance   Gait / Transfers Assistance Needed: Uses RW at baseline.  ADL's / Homemaking Assistance Needed: Completed ADLs with Mod I. Pt son assists with IADL's i.e. cooking.        Hand Dominance   Dominant Hand: Right    Extremity/Trunk Assessment   Upper Extremity Assessment Upper Extremity Assessment: Defer to OT evaluation    Lower Extremity Assessment Lower Extremity Assessment: Generalized weakness;Difficult to assess due to impaired cognition (able to intermittently follow familiar rote commands such as "stand up" but could not follow cues for MMT)    Cervical / Trunk Assessment Cervical / Trunk Assessment: Kyphotic  Communication   Communication: HOH;Other (comment) (Hearing aids)  Cognition Arousal/Alertness: Awake/alert Behavior During Therapy: Flat affect;Impulsive Overall Cognitive Status: Difficult to assess                                 General Comments: makes multiple attempts to verbalize but gets frustrated when he cannot get the words out due to aphasia. Cognition and cue following difficult to assess due to combination of aphasia and being very HOH. Mild impulsivity with transfers.      General Comments General comments (skin  integrity, edema, etc.): VSS on RA    Exercises     Assessment/Plan    PT Assessment Patient needs continued PT services  PT Problem List Decreased strength;Decreased cognition;Decreased activity tolerance;Decreased safety awareness;Decreased balance;Decreased mobility;Decreased coordination       PT Treatment Interventions DME instruction;Balance training;Gait training;Neuromuscular re-education;Stair training;Cognitive remediation;Functional mobility training;Patient/family education;Therapeutic activities;Therapeutic exercise    PT Goals (Current goals can be found in the Care Plan section)  Acute Rehab PT Goals Patient Stated Goal: to go to rehab before home PT Goal Formulation: With patient/family Time For Goal Achievement: 02/06/21 Potential to Achieve Goals: Good    Frequency Min 4X/week   Barriers to discharge        Co-evaluation               AM-PAC PT "6 Clicks" Mobility  Outcome Measure Help needed turning from your back to your side while in a flat bed without using bedrails?: A Little Help needed moving from lying on your back to sitting on the side of a flat bed without using bedrails?: A Little Help needed moving to and from a bed to a chair (including a wheelchair)?: A Little Help needed standing up from a chair using your arms (e.g., wheelchair or bedside chair)?: A Little Help needed to walk in hospital room?: A Little Help needed climbing 3-5 steps with a railing? : A Lot  6 Click Score: 17    End of Session Equipment Utilized During Treatment: Gait belt Activity Tolerance: Patient tolerated treatment well Patient left: in chair;with call bell/phone within reach;with family/visitor present Nurse Communication: Mobility status;Other (comment) (needs help eating, condom cath slipped off during session) PT Visit Diagnosis: Unsteadiness on feet (R26.81);Difficulty in walking, not elsewhere classified (R26.2);Other symptoms and signs involving the  nervous system (R29.898);Muscle weakness (generalized) (M62.81)    Time: 1610-9604 PT Time Calculation (min) (ACUTE ONLY): 40 min   Charges:   PT Evaluation $PT Eval Moderate Complexity: 1 Mod PT Treatments $Gait Training: 8-22 mins $Self Care/Home Management: 8-22        Windell Norfolk, DPT, PN1   Supplemental Physical Therapist [place]    Pager [phone] Acute Rehab Office [phone]

## 2021-01-29 NOTE — Progress Notes (Addendum)
Progress Note    [name]  [id number] DOB: 07/16/1925  DOA: 01/28/2021 PCP: Josetta Huddle, MD      Brief Narrative:    Medical records reviewed and are as summarized below:  Douglas Walker is a 87 y.o. male  with medical history significant of paroxysmal A. Fib(not on systemic anticoagulation due to frequent falls),  peripheral neuropathy, essential tremor, SSS S/P DDDPPM, HTN, CKD stage IIIB, who was brought to the hospital because of altered mental status and aphasia.   He was admitted to the hospital for acute ischemic stroke.     Assessment/Plan:   Active Problems:   Acute CVA (cerebrovascular accident) ([place])   Body mass index is 28.78 kg/m.    Acute stroke with aphasia and dysphagia: Continue aspirin.  Speech therapist recommended dysphagia 1 diet.   2D echo showed EF estimated at 50 to 55%, moderately dilated left and right atria. Lipid panel showed LDL of 126, HDL of 43, triglycerides of 96 and total cholesterol 188.  Hemoglobin A1c is 5.3. Follow-up with neurologist for further recommendations.  Paroxysmal atrial fibrillation, h/o tachybradycardia syndrome SSS s/p permanent pacemaker in place: He is not on anticoagulation because of frequent falls and advanced age.  Hypertension:BP is okay.  No indication for antihypertensives at this time.  Essential tremor: Continue Topamax.  CKD stage IIIb: Creatinine is stable.   Plan discussed with his son, [name], and his daughter-in-law.   Diet Order            DIET - DYS 1 Room service appropriate? Yes with Assist; Fluid consistency: Thin  Diet effective now                    Consultants:  Neurologist  Procedures:  None    Medications:   . aspirin  300 mg Rectal Daily  . DULoxetine  20 mg Oral Daily  . finasteride  5 mg Oral Daily  . heparin  5,000 Units Subcutaneous Q12H  . levothyroxine  25 mcg Oral QAC breakfast  . pregabalin  25 mg Oral QHS  . primidone  250 mg  Oral BID  . tamsulosin  0.4 mg Oral BID  . topiramate  50 mg Oral BID   Continuous Infusions:   Anti-infectives (From admission, onward)   None             Family Communication/Anticipated D/C date and plan/Code Status   DVT prophylaxis: heparin injection 5,000 Units Start: 01/28/21 1315     Code Status: DNR  Family Communication: son, [name] Disposition Plan:    Status is: Inpatient  Remains inpatient appropriate because:Unsafe d/c plan and Inpatient level of care appropriate due to severity of illness   Dispo: The patient is from: Home              Anticipated d/c is to: [facility]              Anticipated d/c date is: 2 days              Patient currently is not medically stable to d/c.   Difficult to place patient No           Subjective:   Interval events noted.  He is unable to provide any history because of expressive aphasia.  Objective:    Vitals:   01/29/21 0815 01/29/21 0845 01/29/21 1000 01/29/21 1312  BP: 140/66 133/69  138/70  Pulse: 70 70  70  Resp: 16 18 16  19  Temp:    99.4 F (37.4 C)  TempSrc:    Oral  SpO2: 93% 93% 94% 97%  Weight:      Height:       No data found.   Intake/Output Summary (Last 24 hours) at 01/29/2021 1519 Last data filed at 01/29/2021 1502 Gross per 24 hour  Intake 1000 ml  Output --  Net 1000 ml   Filed Weights   01/28/21 1201  Weight: 92.3 kg    Exam:  GEN: NAD SKIN: No rash EYES: EOMI, PERRLA ENT: MMM CV: RRR PULM: CTA B ABD: soft, ND, NT, +BS CNS: Alert.  He is able to follow simple commands.  He moves all 4 extremities spontaneously.  He has expressive aphasia. EXT: No edema or tenderness   Data Reviewed:   I have personally reviewed following labs and imaging studies:  Labs: Labs show the following:   Basic Metabolic Panel: Recent Labs  Lab 01/28/21 1045 01/28/21 1048  NA 139 141  K 3.9 3.9  CL 103 103  CO2 26  --   GLUCOSE 105* 97  BUN 34* 35*  CREATININE 1.54* 1.40*   CALCIUM 9.0  --    GFR Estimated Creatinine Clearance: 38 mL/min (A) (by C-G formula based on SCr of 1.4 mg/dL (H)). Liver Function Tests: Recent Labs  Lab 01/28/21 1045  AST 21  ALT 26  ALKPHOS 120  BILITOT 0.7  PROT 6.7  ALBUMIN 3.6   No results for input(s): LIPASE, AMYLASE in the last 168 hours. Recent Labs  Lab 01/28/21 1046  AMMONIA 21   Coagulation profile Recent Labs  Lab 01/28/21 1045  INR 1.1    CBC: Recent Labs  Lab 01/28/21 1045 01/28/21 1048  WBC 6.2  --   NEUTROABS 3.7  --   HGB 11.6* 11.9*  HCT 35.9* 35.0*  MCV 99.7  --   PLT 106*  --    Cardiac Enzymes: No results for input(s): CKTOTAL, CKMB, CKMBINDEX, TROPONINI in the last 168 hours. BNP (last 3 results) No results for input(s): PROBNP in the last 8760 hours. CBG: Recent Labs  Lab 01/28/21 1032  GLUCAP 86   D-Dimer: No results for input(s): DDIMER in the last 72 hours. Hgb A1c: Recent Labs    01/29/21 0237  HGBA1C 5.3   Lipid Profile: Recent Labs    01/29/21 0237  CHOL 188  HDL 43  LDLCALC 126*  TRIG 96  CHOLHDL 4.4   Thyroid function studies: No results for input(s): TSH, T4TOTAL, T3FREE, THYROIDAB in the last 72 hours.  Invalid input(s): FREET3 Anemia work up: No results for input(s): VITAMINB12, FOLATE, FERRITIN, TIBC, IRON, RETICCTPCT in the last 72 hours. Sepsis Labs: Recent Labs  Lab 01/28/21 1045 01/28/21 1046  WBC 6.2  --   LATICACIDVEN  --  1.0    Microbiology Recent Results (from the past 240 hour(s))  SARS Coronavirus 2 by RT PCR (hospital order, performed in [facility] hospital lab) Nasopharyngeal Nasopharyngeal Swab     Status: None   Collection Time: 01/28/21 11:25 AM   Specimen: Nasopharyngeal Swab  Result Value Ref Range Status   SARS Coronavirus 2 NEGATIVE NEGATIVE Final    Comment: (NOTE) SARS-CoV-2 target nucleic acids are NOT DETECTED.  The SARS-CoV-2 RNA is generally detectable in upper and lower respiratory specimens during the  acute phase of infection. The lowest concentration of SARS-CoV-2 viral copies this assay can detect is 250 copies / mL. A negative result does not preclude SARS-CoV-2  infection and should not be used as the sole basis for treatment or other patient management decisions.  A negative result may occur with improper specimen collection / handling, submission of specimen other than nasopharyngeal swab, presence of viral mutation(s) within the areas targeted by this assay, and inadequate number of viral copies (<250 copies / mL). A negative result must be combined with clinical observations, patient history, and epidemiological information.  Fact Sheet for Patients:   StrictlyIdeas.no  Fact Sheet for Healthcare Providers: BankingDealers.co.za  This test is not yet approved or  cleared by the [place] FDA and has been authorized for detection and/or diagnosis of SARS-CoV-2 by FDA under an Emergency Use Authorization (EUA).  This EUA will remain in effect (meaning this test can be used) for the duration of the COVID-19 declaration under Section 564(b)(1) of the Act, 21 U.S.C. section 360bbb-3(b)(1), unless the authorization is terminated or revoked sooner.  Performed at [facility], [place] [address] [place], [place] [address]     Procedures and diagnostic studies:  CT Code Stroke CTA Head W/WO contrast  Result Date: 01/28/2021 CLINICAL DATA:  Facial droop.  Acute stroke suspected EXAM: CT ANGIOGRAPHY HEAD AND NECK CT PERFUSION BRAIN TECHNIQUE: Multidetector CT imaging of the head and neck was performed using the standard protocol during bolus administration of intravenous contrast. Multiplanar CT image reconstructions and MIPs were obtained to evaluate the vascular anatomy. Carotid stenosis measurements (when applicable) are obtained utilizing NASCET criteria, using the distal internal carotid diameter as the denominator. Multiphase  CT imaging of the brain was performed following IV bolus contrast injection. Subsequent parametric perfusion maps were calculated using RAPID software. CONTRAST:  Dose is currently not known COMPARISON:  Head CT from earlier today FINDINGS: CTA NECK FINDINGS Aortic arch: Atheromatous plaque with 3 vessel branching. Right carotid system: Mild for age atheromatous plaque mainly at the bifurcation where there is mild proximal ICA narrowing mainly due to wasting rather than luminal plaque buildup. No ulceration. Left carotid system: Atheromatous wall thickening of the common carotid and proximal ICA, overall mild for age. No stenosis or ulceration. Vertebral arteries: No proximal subclavian stenosis. Mild left vertebral artery dominance. Scattered atheromatous calcifications of the vertebral arteries without flow limiting stenosis. No beading or dissection. Skeleton: No acute finding Other neck: No evidence of inflammation or mass. Upper chest: Negative Review of the MIP images confirms the above findings CTA HEAD FINDINGS Anterior circulation: Atheromatous calcification of the bilateral carotid siphon. No large vessel occlusion, beading, or proximal stenosis. A left M3/4 branch occlusion is noted, as marked on sagittal reformats. Posterior circulation: Mild left vertebral artery dominance. Vertebral and basilar arteries are smooth and widely patent. Fetal type right PCA. No branch occlusion, beading, or aneurysm Venous sinuses: Diffusely patent Anatomic variants: As above Review of the MIP images confirms the above findings CT Brain Perfusion Findings: ASPECTS: 10 CBF (<30%) Volume: 30mL Perfusion (Tmax>6.0s) volume: 69mL Mismatch Volume: 10mL IMPRESSION: 1. Left M3/4 branch occlusion with 25 cc of ischemic range cerebral perfusion. 2. No emergent large vessel occlusion. 3. Atherosclerosis without proximal flow limiting stenosis. Electronically Signed   By: Monte Fantasia M.D.   On: 01/28/2021 11:25   DG Chest 1  View  Result Date: 01/28/2021 CLINICAL DATA:  Code stroke. EXAM: CHEST  1 VIEW COMPARISON:  Chest x-ray dated 08/24/2020 FINDINGS: Stable cardiomegaly. Overall cardiomediastinal silhouette is stable in size and configuration. LEFT chest wall pacemaker/ICD apparatus appears stable in position. Lungs are clear.  No pleural effusion  or pneumothorax is seen. IMPRESSION: No active cardiopulmonary disease. No evidence of pneumonia or pulmonary edema. Stable cardiomegaly. Electronically Signed   By: Franki Cabot M.D.   On: 01/28/2021 11:46   CT Code Stroke CTA Neck W/WO contrast  Result Date: 01/28/2021 CLINICAL DATA:  Facial droop.  Acute stroke suspected EXAM: CT ANGIOGRAPHY HEAD AND NECK CT PERFUSION BRAIN TECHNIQUE: Multidetector CT imaging of the head and neck was performed using the standard protocol during bolus administration of intravenous contrast. Multiplanar CT image reconstructions and MIPs were obtained to evaluate the vascular anatomy. Carotid stenosis measurements (when applicable) are obtained utilizing NASCET criteria, using the distal internal carotid diameter as the denominator. Multiphase CT imaging of the brain was performed following IV bolus contrast injection. Subsequent parametric perfusion maps were calculated using RAPID software. CONTRAST:  Dose is currently not known COMPARISON:  Head CT from earlier today FINDINGS: CTA NECK FINDINGS Aortic arch: Atheromatous plaque with 3 vessel branching. Right carotid system: Mild for age atheromatous plaque mainly at the bifurcation where there is mild proximal ICA narrowing mainly due to wasting rather than luminal plaque buildup. No ulceration. Left carotid system: Atheromatous wall thickening of the common carotid and proximal ICA, overall mild for age. No stenosis or ulceration. Vertebral arteries: No proximal subclavian stenosis. Mild left vertebral artery dominance. Scattered atheromatous calcifications of the vertebral arteries without flow  limiting stenosis. No beading or dissection. Skeleton: No acute finding Other neck: No evidence of inflammation or mass. Upper chest: Negative Review of the MIP images confirms the above findings CTA HEAD FINDINGS Anterior circulation: Atheromatous calcification of the bilateral carotid siphon. No large vessel occlusion, beading, or proximal stenosis. A left M3/4 branch occlusion is noted, as marked on sagittal reformats. Posterior circulation: Mild left vertebral artery dominance. Vertebral and basilar arteries are smooth and widely patent. Fetal type right PCA. No branch occlusion, beading, or aneurysm Venous sinuses: Diffusely patent Anatomic variants: As above Review of the MIP images confirms the above findings CT Brain Perfusion Findings: ASPECTS: 10 CBF (<30%) Volume: 3mL Perfusion (Tmax>6.0s) volume: 100mL Mismatch Volume: 34mL IMPRESSION: 1. Left M3/4 branch occlusion with 25 cc of ischemic range cerebral perfusion. 2. No emergent large vessel occlusion. 3. Atherosclerosis without proximal flow limiting stenosis. Electronically Signed   By: Monte Fantasia M.D.   On: 01/28/2021 11:25   CT Code Stroke Cerebral Perfusion with contrast  Result Date: 01/28/2021 CLINICAL DATA:  Facial droop.  Acute stroke suspected EXAM: CT ANGIOGRAPHY HEAD AND NECK CT PERFUSION BRAIN TECHNIQUE: Multidetector CT imaging of the head and neck was performed using the standard protocol during bolus administration of intravenous contrast. Multiplanar CT image reconstructions and MIPs were obtained to evaluate the vascular anatomy. Carotid stenosis measurements (when applicable) are obtained utilizing NASCET criteria, using the distal internal carotid diameter as the denominator. Multiphase CT imaging of the brain was performed following IV bolus contrast injection. Subsequent parametric perfusion maps were calculated using RAPID software. CONTRAST:  Dose is currently not known COMPARISON:  Head CT from earlier today FINDINGS: CTA  NECK FINDINGS Aortic arch: Atheromatous plaque with 3 vessel branching. Right carotid system: Mild for age atheromatous plaque mainly at the bifurcation where there is mild proximal ICA narrowing mainly due to wasting rather than luminal plaque buildup. No ulceration. Left carotid system: Atheromatous wall thickening of the common carotid and proximal ICA, overall mild for age. No stenosis or ulceration. Vertebral arteries: No proximal subclavian stenosis. Mild left vertebral artery dominance. Scattered atheromatous calcifications of the vertebral  arteries without flow limiting stenosis. No beading or dissection. Skeleton: No acute finding Other neck: No evidence of inflammation or mass. Upper chest: Negative Review of the MIP images confirms the above findings CTA HEAD FINDINGS Anterior circulation: Atheromatous calcification of the bilateral carotid siphon. No large vessel occlusion, beading, or proximal stenosis. A left M3/4 branch occlusion is noted, as marked on sagittal reformats. Posterior circulation: Mild left vertebral artery dominance. Vertebral and basilar arteries are smooth and widely patent. Fetal type right PCA. No branch occlusion, beading, or aneurysm Venous sinuses: Diffusely patent Anatomic variants: As above Review of the MIP images confirms the above findings CT Brain Perfusion Findings: ASPECTS: 10 CBF (<30%) Volume: 65mL Perfusion (Tmax>6.0s) volume: 16mL Mismatch Volume: 63mL IMPRESSION: 1. Left M3/4 branch occlusion with 25 cc of ischemic range cerebral perfusion. 2. No emergent large vessel occlusion. 3. Atherosclerosis without proximal flow limiting stenosis. Electronically Signed   By: Monte Fantasia M.D.   On: 01/28/2021 11:25   ECHOCARDIOGRAM COMPLETE  Result Date: 01/28/2021    ECHOCARDIOGRAM REPORT   Patient Name:   DIANA GERSHON Date of Exam: 01/28/2021 Medical Rec #:  [id number]          Height:       70.5 in Accession #:    [id number]         Weight:       203.5 lb Date of  Birth:  Dec 24, 1925          BSA:          2.114 m Patient Age:    65 years           BP:           176/81 mmHg Patient Gender: M                  HR:           70 bpm. Exam Location:  Inpatient Procedure: 2D Echo Indications:    TIA 435.9  History:        Patient has prior history of Echocardiogram examinations, most                 recent 05/19/2015. Chronic kidney disease, Arrythmias:Atrial                 Fibrillation; Risk Factors:Hypertension.  Sonographer:    Johny Chess Referring Phys: [id number] PING T ZHANG  Sonographer Comments: Image acquisition challenging due to uncooperative patient. IMPRESSIONS  1. Left ventricular ejection fraction, by estimation, is 50 to 55%. The left ventricle has low normal function. The left ventricle has no regional wall motion abnormalities. Left ventricular diastolic function could not be evaluated.  2. Right ventricular systolic function is mildly reduced. The right ventricular size is normal. There is moderately elevated pulmonary artery systolic pressure.  3. Left atrial size was moderately dilated.  4. Right atrial size was moderately dilated.  5. The mitral valve is normal in structure. No evidence of mitral valve regurgitation.  6. The aortic valve is tricuspid. Aortic valve regurgitation is mild. Mild aortic valve sclerosis is present, with no evidence of aortic valve stenosis.  7. Aortic dilatation noted. There is moderate dilatation of the ascending aorta, measuring 47 mm.  8. The inferior vena cava is dilated in size with <50% respiratory variability, suggesting right atrial pressure of 15 mmHg. Comparison(s): Prior images unable to be directly viewed, comparison made by report only. Ascending aorta dilation has worsened since 2019 (but measurement is similar to  CT angio report from may 2021). FINDINGS  Left Ventricle: Left ventricular ejection fraction, by estimation, is 50 to 55%. The left ventricle has low normal function. The left ventricle has no regional  wall motion abnormalities. The left ventricular internal cavity size was normal in size. There is no left ventricular hypertrophy. Abnormal (paradoxical) septal motion, consistent with RV pacemaker. Left ventricular diastolic function could not be evaluated due to atrial fibrillation. Left ventricular diastolic function could not be evaluated. Right Ventricle: The right ventricular size is normal. No increase in right ventricular wall thickness. Right ventricular systolic function is mildly reduced. There is moderately elevated pulmonary artery systolic pressure. The tricuspid regurgitant velocity is 2.76 m/s, and with an assumed right atrial pressure of 15 mmHg, the estimated right ventricular systolic pressure is 41.9 mmHg. Left Atrium: Left atrial size was moderately dilated. Right Atrium: Right atrial size was moderately dilated. Pericardium: There is no evidence of pericardial effusion. Mitral Valve: The mitral valve is normal in structure. No evidence of mitral valve regurgitation. Tricuspid Valve: The tricuspid valve is normal in structure. Tricuspid valve regurgitation is mild. Aortic Valve: The aortic valve is tricuspid. Aortic valve regurgitation is mild. Mild aortic valve sclerosis is present, with no evidence of aortic valve stenosis. Pulmonic Valve: The pulmonic valve was not well visualized. Pulmonic valve regurgitation is not visualized. Aorta: Aortic dilatation noted. There is moderate dilatation of the ascending aorta, measuring 47 mm. Venous: The inferior vena cava is dilated in size with less than 50% respiratory variability, suggesting right atrial pressure of 15 mmHg. IAS/Shunts: No atrial level shunt detected by color flow Doppler. Additional Comments: A pacer wire is visualized.  LEFT VENTRICLE PLAX 2D LVIDd:         4.70 cm Diastology LVIDs:         3.40 cm LV e' medial:    5.87 cm/s LV PW:         1.30 cm LV E/e' medial:  21.0 LV IVS:        1.00 cm LV e' lateral:   10.40 cm/s                         LV E/e' lateral: 11.8  RIGHT VENTRICLE            IVC RV S prime:     8.38 cm/s  IVC diam: 2.40 cm TAPSE (M-mode): 1.1 cm LEFT ATRIUM             Index       RIGHT ATRIUM           Index LA diam:        4.20 cm 1.99 cm/m  RA Area:     16.00 cm LA Vol (A2C):   63.8 ml 30.18 ml/m RA Volume:   41.30 ml  19.54 ml/m LA Vol (A4C):   67.7 ml 32.03 ml/m LA Biplane Vol: 65.7 ml 31.08 ml/m  AORTIC VALVE LVOT Vmax:   97.90 cm/s LVOT Vmean:  62.500 cm/s LVOT VTI:    0.228 m  AORTA Ao Asc diam: 4.70 cm MITRAL VALVE                TRICUSPID VALVE MV Area (PHT): 3.60 cm     TR Peak grad:   30.5 mmHg MV Decel Time: 211 msec     TR Vmax:        276.00 cm/s MV E velocity: 123.00 cm/s MV A velocity: 33.10 cm/s  SHUNTS MV E/A ratio:  3.72         Systemic VTI: 0.23 m Dani Gobble Croitoru MD Electronically signed by Sanda Klein MD Signature Date/Time: 01/28/2021/2:10:39 PM    Final    CT HEAD CODE STROKE WO CONTRAST  Result Date: 01/28/2021 CLINICAL DATA:  Code stroke. Facial droop and altered mental status, side not specified EXAM: CT HEAD WITHOUT CONTRAST TECHNIQUE: Contiguous axial images were obtained from the base of the skull through the vertex without intravenous contrast. COMPARISON:  07/18/2015 FINDINGS: Brain: No evidence of acute infarction, hemorrhage, hydrocephalus, extra-axial collection or mass lesion/mass effect. Generalized atrophy. Moderate chronic small vessel ischemia in the cerebral white matter. Vascular: No definite hyperdense vessel. A left M2 branch appears prominent in density on coronal reformats but not on the other projections. Skull: Normal. Negative for fracture or focal lesion. Sinuses/Orbits: No acute finding. Other: These results were communicated to Dr. Curly Shores at 11:00 amon 1/30/2022by text page via the [facility] messaging system. ASPECTS [facility] Stroke Program Early CT Score) - Ganglionic level infarction (caudate, lentiform nuclei, internal capsule, insula, M1-M3 cortex): 7 -  Supraganglionic infarction (M4-M6 cortex): 3 Total score (0-10 with 10 being normal): 10 IMPRESSION: 1. Aging brain without acute finding. 2. ASPECTS is 10 Electronically Signed   By: Monte Fantasia M.D.   On: 01/28/2021 11:02               LOS: 1 day   Ryanna Teschner  [facility]   Pager on [email or web address]. If 7PM-7AM, please contact night-coverage at [email or web address]     01/29/2021, 3:19 PM

## 2021-01-29 NOTE — Evaluation (Signed)
Occupational Therapy Evaluation Patient Details Name: Douglas Walker MRN: [id number] DOB: 1925/10/14 Today's Date: 01/29/2021    History of Present Illness 87 y.o. male presenting with AMS and difficulty ambulating. CTA (+) occluded left M3/M4. MRI pending. PMHx significant for A-fib, peripheral neuropathy, essential tremor, HTN, CKD stage IIIb, and Hx of falls.   Clinical Impression   PTA patient was living with his son in a single-level private residence and was completing ADLs and household mobility with Mod I and use of RW. Son was providing assist with IADLs including transportation. Patient currently limited by aphasia with ability to answer "yes/no" questions with decreased/questionable accuracy. Patient also limited by decreased standing balance, decreased attention to R visual field, and mild R hemiparesis. Patient would benefit from continued acute OT services to maximize safety and independence in prep for safe d/c to next level of care. Given patient PLOF and current deficits, recommendation for CIR. Patient/family in agreement with POC.     Follow Up Recommendations  CIR;Supervision/Assistance - 24 hour    Equipment Recommendations  None recommended by OT    Recommendations for Other Services Rehab consult     Precautions / Restrictions Precautions Precautions: Fall Precaution Comments: Aphasic, HOH Restrictions Weight Bearing Restrictions: No      Mobility Bed Mobility Overal bed mobility: Needs Assistance Bed Mobility: Supine to Sit     Supine to sit: Min guard     General bed mobility comments: Min guard with HOB elevated and increased time. No use of rails.    Transfers Overall transfer level: Needs assistance Equipment used: Rolling walker (2 wheeled) Transfers: Sit to/from [facility] Sit to Stand: Min assist Stand pivot transfers: Min guard       General transfer comment: Min assist for sit to stand from EOB to RW with  increased time and cues for hand placement. Min guard for stand-pivot transfer to recliner.    Balance Overall balance assessment: Needs assistance Sitting-balance support: No upper extremity supported;Feet supported Sitting balance-Leahy Scale: Fair Sitting balance - Comments: Patient able to maintain static sitting at EOB without LOB. Unable to doff/don footwear without external assist.   Standing balance support: Bilateral upper extremity supported;During functional activity Standing balance-Leahy Scale: Poor Standing balance comment: Reliant on BUE on RW.                           ADL either performed or assessed with clinical judgement   ADL                                               Vision Baseline Vision/History: Wears glasses Wears Glasses: At all times Patient Visual Report: Other (comment) (Macular degeneration) Vision Assessment?: Vision impaired- to be further tested in functional context     Perception     Praxis      Pertinent Vitals/Pain Pain Assessment: Faces Faces Pain Scale: No hurt     Hand Dominance Right   Extremity/Trunk Assessment Upper Extremity Assessment Upper Extremity Assessment: Generalized weakness;Difficult to assess due to impaired cognition (Patient able to follow commands for AROM assessment but unable to follow commands necessary for MMT.)           Communication Communication Communication: HOH;Other (comment) (Hearing aids)   Cognition Arousal/Alertness: Awake/alert Behavior During Therapy: Flat affect Overall Cognitive Status: Difficult to assess  General Comments: Patient answers "yes/no" questions with increased time. Questionable accuracy. Mild impuslivity with transfers.   General Comments  VSS on RA.    Exercises     Shoulder Instructions      Home Living Family/patient expects to be discharged to:: Private residence Living  Arrangements: Children Available Help at Discharge: Family;Available 24 hours/day Type of Home: House Home Access: Level entry     Home Layout: One level     Bathroom Shower/Tub: Occupational psychologist: Handicapped height     Home Equipment: Shower seat;Grab bars - toilet;Walker - 2 wheels;Cane - single point;Bedside commode;Grab bars - tub/shower;Hand held shower head;Wheelchair - manual          Prior Functioning/Environment Level of Independence: Needs assistance  Gait / Transfers Assistance Needed: Uses RW at baseline. ADL's / Homemaking Assistance Needed: Completed ADLs with Mod I. Pt son assists with IADL's i.e. cooking.            OT Problem List: Decreased strength;Impaired balance (sitting and/or standing);Decreased safety awareness      OT Treatment/Interventions: Self-care/ADL training;Therapeutic exercise;Neuromuscular education;Therapeutic activities;Cognitive remediation/compensation;Patient/family education;Balance training    OT Goals(Current goals can be found in the care plan section) Acute Rehab OT Goals Patient Stated Goal: Unable to state. OT Goal Formulation: With patient/family Time For Goal Achievement: 02/12/21 Potential to Achieve Goals: Good ADL Goals Pt Will Perform Eating: Independently;sitting Pt Will Perform Grooming: with modified independence;standing Pt Will Perform Upper Body Dressing: sitting;with modified independence Pt Will Perform Lower Body Dressing: sit to/from stand;with supervision Pt Will Transfer to Toilet: ambulating;with supervision Pt Will Perform Toileting - Clothing Manipulation and hygiene: with supervision;sit to/from stand Pt/caregiver will Perform Home Exercise Program: Both right and left upper extremity;With written HEP provided  OT Frequency: Min 2X/week   Barriers to D/C:            Co-evaluation              AM-PAC OT "6 Clicks" Daily Activity     Outcome Measure Help from another  person eating meals?: A Little Help from another person taking care of personal grooming?: A Little Help from another person toileting, which includes using toliet, bedpan, or urinal?: A Little Help from another person bathing (including washing, rinsing, drying)?: A Little Help from another person to put on and taking off regular upper body clothing?: A Little Help from another person to put on and taking off regular lower body clothing?: A Little 6 Click Score: 18   End of Session Equipment Utilized During Treatment: Gait belt;Rolling walker Nurse Communication: Mobility status  Activity Tolerance: Patient tolerated treatment well Patient left: in chair;with call bell/phone within reach;with chair alarm set  OT Visit Diagnosis: Unsteadiness on feet (R26.81);Muscle weakness (generalized) (M62.81)                Time: 8242-3536 OT Time Calculation (min): 24 min Charges:  OT General Charges $OT Visit: 1 Visit OT Evaluation $OT Eval Moderate Complexity: 1 Mod OT Treatments $Therapeutic Activity: 8-22 mins  Hutson Luft H. OTR/L Supplemental OT, Department of rehab services [phone]  Morgon Pamer R H. 01/29/2021, 12:26 PM

## 2021-01-30 DIAGNOSIS — I63412 Cerebral infarction due to embolism of left middle cerebral artery: Secondary | ICD-10-CM

## 2021-01-30 DIAGNOSIS — D631 Anemia in chronic kidney disease: Secondary | ICD-10-CM

## 2021-01-30 DIAGNOSIS — R1312 Dysphagia, oropharyngeal phase: Secondary | ICD-10-CM

## 2021-01-30 DIAGNOSIS — R5381 Other malaise: Secondary | ICD-10-CM

## 2021-01-30 DIAGNOSIS — N1832 Chronic kidney disease, stage 3b: Secondary | ICD-10-CM

## 2021-01-30 DIAGNOSIS — G250 Essential tremor: Secondary | ICD-10-CM

## 2021-01-30 DIAGNOSIS — E039 Hypothyroidism, unspecified: Secondary | ICD-10-CM

## 2021-01-30 DIAGNOSIS — I480 Paroxysmal atrial fibrillation: Secondary | ICD-10-CM

## 2021-01-30 MED ORDER — APIXABAN 5 MG PO TABS
5.0000 mg | ORAL_TABLET | Freq: Two times a day (BID) | ORAL | Status: DC
  Administered 2021-01-30 – 2021-02-01 (×4): 5 mg via ORAL
  Filled 2021-01-30 (×4): qty 1

## 2021-01-30 MED ORDER — ASPIRIN 325 MG PO TABS
325.0000 mg | ORAL_TABLET | Freq: Every day | ORAL | Status: DC
  Administered 2021-01-30: 325 mg via ORAL
  Filled 2021-01-30: qty 1

## 2021-01-30 MED ORDER — STROKE: EARLY STAGES OF RECOVERY BOOK
Status: AC
  Filled 2021-01-30: qty 1

## 2021-01-30 NOTE — Progress Notes (Signed)
PROGRESS NOTE  Douglas Walker [id number] DOB: Jul 18, 1925   PCP: Josetta Huddle, MD  Patient is from: Home  DOA: 01/28/2021 LOS: 2  Chief complaints: Altered mental status and aphasia  Brief Narrative / Interim history: 87 year old male with PMH of paroxysmal A. fib not on AC due to frequent falls, peripheral neuropathy, SSS s/p PPM, CKD-3B and essential tremor brought to ED due to altered mental status and aphasia, and found to have acute left MCA branch CVA with CTA and CT perfusion study suggesting M3-4 occlusion.  Neurology recommended aspirin.  Not a candidate for anticoagulation.  Therapy recommended CIR.  SLP recommended dysphagia 1 diet.  Subjective: Seen and examined earlier this morning.  No major events overnight of this morning.  No complaints but patient with severe expressive aphasia not able to verbalize.  He appropriately follows commands.  Does not appear to be in distress.  Patient son at bedside.  No questions or concerns.  Objective: Vitals:   01/29/21 1913 01/29/21 2313 01/30/21 0800 01/30/21 1107  BP: (!) 148/73 (!) 152/72  (!) 102/58  Pulse: 70 69  70  Resp: 19 15 20  (!) 21  Temp: 98.2 F (36.8 C) 98.2 F (36.8 C)  99.5 F (37.5 C)  TempSrc: Oral Oral  Oral  SpO2: 95% 95% 95% 94%  Weight:      Height:        Intake/Output Summary (Last 24 hours) at 01/30/2021 1334 Last data filed at 01/30/2021 1055 Gross per 24 hour  Intake 1592 ml  Output 800 ml  Net 792 ml   Filed Weights   01/28/21 1201  Weight: 92.3 kg    Examination:  GENERAL: No apparent distress.  Nontoxic. HEENT: MMM.  Vision and hearing grossly intact.  NECK: Supple.  No apparent JVD.  RESP: On RA.  No IWOB.  Fair aeration bilaterally. CVS: Irregular rhythm.  Normal rate. Heart sounds normal.  ABD/GI/GU: BS+. Abd soft, NTND.  MSK/EXT:  Moves extremities. No apparent deformity. No edema.  SKIN: no apparent skin lesion or wound NEURO: Awake and alert.  No facial asymmetry.  Motor  symmetric and intact.  Patellar reflex symmetric.  Essential tremor in place. PSYCH: Calm. Normal affect.  Procedures:  None  Microbiology summarized: COVID-19 PCR nonreactive.  Assessment & Plan: Acute left MCA CVA in patient with known history of PAF not on anticoagulation: Likely ischemic but at risk for embolic CVA as well. CTA and CT perfusion study suggesting M3-4 occlusion.  Presented with acute encephalopathy and aphasia.  TTE without significant finding.  LDL 126.  A1c 5.3.  Still with significant aphasia and dysphagia. -Continue full dose aspirin -Not a candidate for anticoagulation due to frequent fall -Therapy recommended CIR  Dysphagia due to CVA-SLP recommended dysphagia 1 diet. -We will continue  Paroxysmal A. Fib: Not on medication for this.  Not a candidate for [facility] due to recurrent 4. -Continue full dose aspirin as above  History of SSS s/p PPM -Noted.  Essential hypertension: BP within acceptable range.  CKD-3B: Stable.  Essential tremor: Stable. -Continue home medication  Hypothyroidism: Stable. -Continue home Synthroid  Debility -CIR for PT/OT  BPH: Stable -Continue home meds.   Body mass index is 28.78 kg/m.         DVT prophylaxis:  heparin injection 5,000 Units Start: 01/28/21 1315  Code Status: DNR/DNI Family Communication: Updated patient's son at bedside. Level of care: Progressive Status is: Inpatient  Remains inpatient appropriate because:Unsafe d/c plan   Dispo: The patient  is from: Home              Anticipated d/c is to: CIR              Anticipated d/c date is: 1 day              Patient currently is medically stable to d/c.   Difficult to place patient Yes       Consultants:  Neurology   Sch Meds:  Scheduled Meds: . aspirin  325 mg Oral Daily  . DULoxetine  20 mg Oral Daily  . finasteride  5 mg Oral Daily  . heparin  5,000 Units Subcutaneous Q12H  . levothyroxine  25 mcg Oral QAC breakfast  . pregabalin   25 mg Oral QHS  . primidone  250 mg Oral BID  . tamsulosin  0.4 mg Oral BID  . topiramate  50 mg Oral BID   Continuous Infusions: PRN Meds:.acetaminophen **OR** acetaminophen (TYLENOL) oral liquid 160 mg/5 mL **OR** acetaminophen, hydrALAZINE, senna-docusate  Antimicrobials: Anti-infectives (From admission, onward)   None       I have personally reviewed the following labs and images: CBC: Recent Labs  Lab 01/28/21 1045 01/28/21 1048  WBC 6.2  --   NEUTROABS 3.7  --   HGB 11.6* 11.9*  HCT 35.9* 35.0*  MCV 99.7  --   PLT 106*  --    BMP &GFR Recent Labs  Lab 01/28/21 1045 01/28/21 1048  NA 139 141  K 3.9 3.9  CL 103 103  CO2 26  --   GLUCOSE 105* 97  BUN 34* 35*  CREATININE 1.54* 1.40*  CALCIUM 9.0  --    Estimated Creatinine Clearance: 38 mL/min (A) (by C-G formula based on SCr of 1.4 mg/dL (H)). Liver & Pancreas: Recent Labs  Lab 01/28/21 1045  AST 21  ALT 26  ALKPHOS 120  BILITOT 0.7  PROT 6.7  ALBUMIN 3.6   No results for input(s): LIPASE, AMYLASE in the last 168 hours. Recent Labs  Lab 01/28/21 1046  AMMONIA 21   Diabetic: Recent Labs    01/29/21 0237  HGBA1C 5.3   Recent Labs  Lab 01/28/21 1032  GLUCAP 86   Cardiac Enzymes: No results for input(s): CKTOTAL, CKMB, CKMBINDEX, TROPONINI in the last 168 hours. No results for input(s): PROBNP in the last 8760 hours. Coagulation Profile: Recent Labs  Lab 01/28/21 1045  INR 1.1   Thyroid Function Tests: No results for input(s): TSH, T4TOTAL, FREET4, T3FREE, THYROIDAB in the last 72 hours. Lipid Profile: Recent Labs    01/29/21 0237  CHOL 188  HDL 43  LDLCALC 126*  TRIG 96  CHOLHDL 4.4   Anemia Panel: No results for input(s): VITAMINB12, FOLATE, FERRITIN, TIBC, IRON, RETICCTPCT in the last 72 hours. Urine analysis:    Component Value Date/Time   COLORURINE AMBER (A) 01/28/2021 1146   APPEARANCEUR TURBID (A) 01/28/2021 1146   LABSPEC 1.020 01/28/2021 1146   PHURINE 7.0  01/28/2021 1146   GLUCOSEU NEGATIVE 01/28/2021 1146   HGBUR NEGATIVE 01/28/2021 [place] 01/28/2021 [place] 01/28/2021 1146   PROTEINUR NEGATIVE 01/28/2021 1146   UROBILINOGEN 0.2 06/18/2015 0441   NITRITE NEGATIVE 01/28/2021 1146   LEUKOCYTESUR LARGE (A) 01/28/2021 1146   Sepsis Labs: Invalid input(s): PROCALCITONIN, [place]  Microbiology: Recent Results (from the past 240 hour(s))  SARS Coronavirus 2 by RT PCR (hospital order, performed in [facility] hospital lab) Nasopharyngeal Nasopharyngeal Swab  Status: None   Collection Time: 01/28/21 11:25 AM   Specimen: Nasopharyngeal Swab  Result Value Ref Range Status   SARS Coronavirus 2 NEGATIVE NEGATIVE Final    Comment: (NOTE) SARS-CoV-2 target nucleic acids are NOT DETECTED.  The SARS-CoV-2 RNA is generally detectable in upper and lower respiratory specimens during the acute phase of infection. The lowest concentration of SARS-CoV-2 viral copies this assay can detect is 250 copies / mL. A negative result does not preclude SARS-CoV-2 infection and should not be used as the sole basis for treatment or other patient management decisions.  A negative result may occur with improper specimen collection / handling, submission of specimen other than nasopharyngeal swab, presence of viral mutation(s) within the areas targeted by this assay, and inadequate number of viral copies (<250 copies / mL). A negative result must be combined with clinical observations, patient history, and epidemiological information.  Fact Sheet for Patients:   StrictlyIdeas.no  Fact Sheet for Healthcare Providers: BankingDealers.co.za  This test is not yet approved or  cleared by the [place] FDA and has been authorized for detection and/or diagnosis of SARS-CoV-2 by FDA under an Emergency Use Authorization (EUA).  This EUA will remain in effect (meaning this  test can be used) for the duration of the COVID-19 declaration under Section 564(b)(1) of the Act, 21 U.S.C. section 360bbb-3(b)(1), unless the authorization is terminated or revoked sooner.  Performed at [facility], [place] [address], [place], [place] [address]     Radiology Studies: No results found.    Shady Padron T. [place]  If 7PM-7AM, please contact night-coverage [email or web address] 01/30/2021, 1:34 PM

## 2021-01-30 NOTE — Evaluation (Signed)
Speech Language Pathology Evaluation Patient Details Name: Douglas Walker MRN: [id number] DOB: [birth year] Today's Date: 01/30/2021 Time: 3267-1245 SLP Time Calculation (min) (ACUTE ONLY): 17 min  Problem List:  Patient Active Problem List   Diagnosis Date Noted  . Acute CVA (cerebrovascular accident) ([place]) 01/28/2021  . Pacemaker 08/30/2020  . Hematoma, chest wall 07/24/2020  . Exudative age-related macular degeneration of right eye with inactive choroidal neovascularization ([place]) 05/10/2020  . Exudative age-related macular degeneration of left eye with inactive choroidal neovascularization ([place]) 05/10/2020  . Retinal hemorrhage of right eye 05/10/2020  . Exposure keratopathy, bilateral 05/10/2020  . Bilateral dry eyes 05/10/2020  . Advanced nonexudative age-related macular degeneration of both eyes with subfoveal involvement 05/10/2020  . Sick sinus syndrome ([place]) 12/06/2018  . Complete heart block ([place]) 12/04/2018  . Chronic low back pain 12/15/2017  . Hereditary and idiopathic peripheral neuropathy 07/01/2016  . Essential tremor 02/28/2016  . BPPV (benign paroxysmal positional vertigo) 06/19/2015  . Dizziness 06/18/2015  . Bacteria in urine 06/18/2015  . CKD (chronic kidney disease) stage 3, GFR 30-59 ml/min: Probable 06/18/2015  . Chronic diastolic CHF (congestive heart failure) () 05/30/2015  . Fall   . Syncope 05/19/2015  . UTI (lower urinary tract infection) 05/19/2015  . Hypotension 05/19/2015  . Dehydration 05/19/2015  . Fracture of rib of left side 05/19/2015  . Syncope and collapse 05/19/2015  . Mobitz type II atrioventricular block 09/13/2014  . Essential hypertension 09/13/2014  . Chronic anticoagulation 09/13/2014  . Tachycardia-bradycardia syndrome ([place]) 09/13/2014  . Paroxysmal atrial fibrillation ([place]) 10/28/2013  . Thrombocytopenia ([place]) 04/06/2013   Past Medical History:  Past Medical History:  Diagnosis Date  . Arthritis    shoulders and back   . Cancer ([place])    skin cancers  . Chronic low back pain 12/15/2017  . CKD (chronic kidney disease) stage 3, GFR 30-59 ml/min (HCC)   . Coronary artery disease   . Dysrhythmia     Paroxysmal atrial fibrillation; Tachycardia-bradycardia syndrome  . Essential tremor 02/28/2016  . GERD (gastroesophageal reflux disease)   . Hernia   . History of hiatal hernia   . Hyperlipemia   . Hypertension   . Macular degeneration   . Neuromuscular disorder (HCC)    neuropathy  . Paroxysmal atrial fibrillation ([place]) 10/28/2013  . Presence of permanent cardiac pacemaker 12/04/2018  . Tachycardia-bradycardia syndrome ([place]) 09/13/2014  . TIA (transient ischemic attack)    Past Surgical History:  Past Surgical History:  Procedure Laterality Date  . APPENDECTOMY    . EYE SURGERY    . HERNIA REPAIR    . HERNIA REPAIR    . INSERT / REPLACE / REMOVE PACEMAKER  12/04/2018  . IRRIGATION AND DEBRIDEMENT ABSCESS Right 07/24/2020   Procedure: IRRIGATION AND DEBRIDEMENT HEMATOMA;  Surgeon: Coralie Keens, MD;  Location: [facility];  Service: General;  Laterality: Right;  . MASS EXCISION Right 07/20/2020   Procedure: EXCISION OF RIGHT CHEST WALL MASS;  Surgeon: Stark Klein, MD;  Location: [facility];  Service: General;  Laterality: Right;  . PACEMAKER IMPLANT N/A 12/04/2018   Procedure: PACEMAKER IMPLANT;  Surgeon: Evans Lance, MD;  Location: [facility];  Service: Cardiovascular;  Laterality: N/A;   HPI:  Patient is a 87 y.o. mqale with PMH: paroxysmal afib, not on anticoagulants due to frequent falls, peripheral neuropathy, essential tremor, HTN, CKD stage IIIB, who presented to hospital from home (lives with son) with AMS. Son had reported sudden onset confusion, unable to talk , some  drooling and wobbly when walking. CT head did not find anything significant, CTA showed occluded left M3/M4 but no intervention due to size of branch. MRI pending patient being more oriented.   Assessment / Plan /  Recommendation Clinical Impression  Pt presents with a severe expressive aphasia with suspected deficits in receptive language as well. Difficult to assess cognition fully due to aphasic errors, however per chart review cognition appears to be impaired. Pt nonfluent with speech only able to ellicit single word utterances with maximal cueing. Pt appeared to follow some simple commands with repetition inconsistently (hearing deficits suspected to worsen auditory comprehension; of note bilateral hearing aids were in use). Pt with reduced attention to right visual field per chart review. Motor speech skills appeared intact. No family member or caregiver present to provide further insight into PLOF and current level of function. SLP to follow up for aphasic and suspected cognitive deficits.    SLP Assessment  SLP Recommendation/Assessment: Patient needs continued Speech Lanaguage Pathology Services SLP Visit Diagnosis: Aphasia (R47.01);Cognitive communication deficit (R41.841);Attention and concentration deficit    Follow Up Recommendations  24 hour supervision/assistance;Inpatient Rehab    Frequency and Duration min 2x/week  2 weeks      SLP Evaluation Cognition  Overall Cognitive Status: Difficult to assess (though suspected to be worsened from baseline) Arousal/Alertness: Awake/alert Orientation Level: Other (comment) (signficant expressive aphasia) Attention: Focused;Sustained Focused Attention: Impaired Focused Attention Impairment: Functional basic;Verbal basic Sustained Attention: Impaired Sustained Attention Impairment: Verbal basic;Functional basic Memory:  (difficult to assess) Awareness: Impaired Problem Solving: Impaired Problem Solving Impairment: Verbal complex;Functional complex Executive Function: Organizing;Sequencing Sequencing: Impaired Organizing: Impaired Safety/Judgment: Impaired       Conservation officer, historic buildings Overall Auditory Comprehension:  Impaired Yes/No Questions: Impaired Interfering Components: Hearing;Processing speed;Attention EffectiveTechniques: Extra processing time;Increased volume;Slowed speech;Repetition;Visual/Gestural cues    Expression Verbal Expression Overall Verbal Expression: Impaired Initiation: Impaired Level of Generative/Spontaneous Verbalization: Word Repetition: Impaired Level of Impairment: Word level;Phrase level;Sentence level Naming: Impairment Written Expression Dominant Hand: Right   Oral / Motor  Oral Motor/Sensory Function Overall Oral Motor/Sensory Function: Mild impairment Facial ROM: Reduced right;Reduced left Facial Symmetry: Within Functional Limits Lingual ROM: Reduced right;Reduced left Lingual Symmetry: Within Functional Limits Lingual Strength: Reduced Motor Speech Overall Motor Speech: Appears within functional limits for tasks assessed   GO                    Hayden Rasmussen MA, CCC-SLP Acute Rehabilitation Services   01/30/2021, 2:14 PM

## 2021-01-30 NOTE — Progress Notes (Signed)
Physical Therapy Treatment Patient Details Name: Douglas Walker MRN: [id number] DOB: 1925-11-26 Today's Date: 01/30/2021    History of Present Illness 87 y.o. male presenting with AMS and difficulty ambulating. CTA (+) occluded left M3/M4. MRI pending. PMHx significant for A-fib, peripheral neuropathy, essential tremor, HTN, CKD stage IIIb, and Hx of falls.    PT Comments    Pt continues to need encouragement and cues to try to communicate. Worked on this with breakfast in chair after ambulating. Pt stands with min-guard A, vc's for safety due to impulsivity and decreased awareness. Pt ambulated 150' with 2 standing rest breaks. Min A to steady and to increase attention to R side where he continues to run into obstacles. Continue to recommend CIR. PT will continue to follow.    Follow Up Recommendations  CIR;Supervision/Assistance - 24 hour     Equipment Recommendations  Rolling walker with 5" wheels;3in1 (PT)    Recommendations for Other Services       Precautions / Restrictions Precautions Precautions: Fall Precaution Comments: Aphasic, HOH, legally blind in L eye, possible R inattention and/or visual impairment. Says "yes" the most, even when he means no Required Braces or Orthoses:  (hearing aids) Restrictions Weight Bearing Restrictions: No    Mobility  Bed Mobility Overal bed mobility: Needs Assistance Bed Mobility: Supine to Sit     Supine to sit: Supervision     General bed mobility comments: increased time needed but no physical assist to come to EOB  Transfers Overall transfer level: Needs assistance Equipment used: Rolling walker (2 wheeled) Transfers: Sit to/from Stand Sit to Stand: Min guard         General transfer comment: pt initially stood with min-guard but indicated that he needed to urinate. Sat back down and tube reconnected. Able to retun to standing min-guard A. Pt mildly impulsive with vc's to wait for line mgmt before  standing  Ambulation/Gait Ambulation/Gait assistance: Min assist Gait Distance (Feet): 150 Feet Assistive device: Rolling walker (2 wheeled) Gait Pattern/deviations: Step-through pattern;Decreased step length - right;Decreased step length - left;Narrow base of support;Trendelenburg;Drifts right/left;Trunk flexed Gait velocity: decreased Gait velocity interpretation: <1.31 ft/sec, indicative of household ambulator General Gait Details: pt continues to need cues to attend to R sided obstacles. Follows instructions to navigate around obstacles and at end of session did correct one time on his own. Needs min A for safety and increased asssist when turning. Gait very slow   Stairs             Wheelchair Mobility    Modified Rankin (Stroke Patients Only) Modified Rankin (Stroke Patients Only) Pre-Morbid Rankin Score: No symptoms Modified Rankin: Moderately severe disability     Balance Overall balance assessment: Needs assistance Sitting-balance support: No upper extremity supported;Feet supported Sitting balance-Leahy Scale: Fair Sitting balance - Comments: Patient able to maintain static sitting at EOB without LOB. Unable to doff/don footwear without external assist.   Standing balance support: Bilateral upper extremity supported;During functional activity Standing balance-Leahy Scale: Poor Standing balance comment: Reliant on BUE on RW.                            Cognition Arousal/Alertness: Awake/alert Behavior During Therapy: Flat affect;Impulsive Overall Cognitive Status: Difficult to assess                                 General Comments: encouraged pt to  try to verbalize or gesture. He does this at times but gets frustrated. Follows 1 step commands consistently      Exercises      General Comments General comments (skin integrity, edema, etc.): SpO2 93% on RA. HR 70-74 bpm while ambulating. Assisted pt in eating breakfast once in chair,  working on him self directing what he ate and when he was done      Pertinent Vitals/Pain Pain Assessment: Faces Faces Pain Scale: No hurt    Home Living                      Prior Function            PT Goals (current goals can now be found in the care plan section) Acute Rehab PT Goals Patient Stated Goal: to go to rehab before home PT Goal Formulation: With patient/family Time For Goal Achievement: 02/06/21 Potential to Achieve Goals: Good Progress towards PT goals: Progressing toward goals    Frequency    Min 4X/week      PT Plan Current plan remains appropriate    Co-evaluation              AM-PAC PT "6 Clicks" Mobility   Outcome Measure  Help needed turning from your back to your side while in a flat bed without using bedrails?: A Little Help needed moving from lying on your back to sitting on the side of a flat bed without using bedrails?: A Little Help needed moving to and from a bed to a chair (including a wheelchair)?: A Little Help needed standing up from a chair using your arms (e.g., wheelchair or bedside chair)?: A Little Help needed to walk in hospital room?: A Little Help needed climbing 3-5 steps with a railing? : A Lot 6 Click Score: 17    End of Session Equipment Utilized During Treatment: Gait belt Activity Tolerance: Patient tolerated treatment well Patient left: in chair;with call bell/phone within reach;with chair alarm set Nurse Communication: Mobility status PT Visit Diagnosis: Unsteadiness on feet (R26.81);Difficulty in walking, not elsewhere classified (R26.2);Other symptoms and signs involving the nervous system (R29.898);Muscle weakness (generalized) (M62.81)     Time: 2355-7322 PT Time Calculation (min) (ACUTE ONLY): 40 min  Charges:  $Gait Training: 8-22 mins $Therapeutic Activity: 8-22 mins $Self Care/Home Management: Strawn, PT  Acute Rehab Services  Pager  [phone] Office [place] 01/30/2021, 10:57 AM

## 2021-01-30 NOTE — Progress Notes (Signed)
  Speech Language Pathology Treatment: Dysphagia  Patient Details Name: Douglas Walker MRN: [id number] DOB: [birth year] Today's Date: 01/30/2021 Time: 8338-2505 SLP Time Calculation (min) (ACUTE ONLY): 16 min  Assessment / Plan / Recommendation Clinical Impression  Followed up for diet tolerance. Pt observed with lunch meal sitting upright in chair at bedside. Pt appears very HOH, aphasia persists. Pt with consistent isolated coughing with thin liquids in 4/4 trials, concerning for reduced airway protection. Assessed with nectar thick liquids. No overt s/sx of aspiration exhibited. Pt exhibited delayed AP transit and poor bolus cohesion with solid PO. Recommend continue dysphagia 1 (puree) textures and liquid downgrade to nectar thick liquids with medicines in puree and full supervision with all PO.    HPI HPI: Patient is a 87 y.o. mqale with PMH: paroxysmal afib, not on anticoagulants due to frequent falls, peripheral neuropathy, essential tremor, HTN, CKD stage IIIB, who presented to hospital from home (lives with son) with AMS. Son had reported sudden onset confusion, unable to talk , some drooling and wobbly when walking. CT head did not find anything significant, CTA showed occluded left M3/M4 but no intervention due to size of branch. MRI pending patient being more oriented.      SLP Plan  Continue with current plan of care       Recommendations  Diet recommendations: Dysphagia 1 (puree);Nectar-thick liquid Liquids provided via: Cup Medication Administration: Whole meds with puree Supervision: Full supervision/cueing for compensatory strategies;Staff to assist with self feeding Compensations: Small sips/bites;Minimize environmental distractions;Slow rate Postural Changes and/or Swallow Maneuvers: Seated upright 90 degrees;Upright 30-60 min after meal                General recommendations: Rehab consult Oral Care Recommendations: Oral care BID;Staff/trained caregiver to  provide oral care Follow up Recommendations: 24 hour supervision/assistance;Inpatient Rehab SLP Visit Diagnosis: Dysphagia, oral phase (R13.11);Dysphagia, unspecified (R13.10) Plan: Continue with current plan of care       Bailey, CCC-SLP Acute Rehabilitation Services   01/30/2021, 1:46 PM

## 2021-01-30 NOTE — Progress Notes (Signed)
STROKE TEAM PROGRESS NOTE   INTERVAL HISTORY No acute events since arrival.  Patient is sitting up in chair with son at bedside. He is able to speak a 3 word sentence today but still with moderate to severe receptive and expressive aphasia. Mimics well for exam. He is wearing his hearing aids today.  His son reports patient was taken off warfarin primarily due to postoperative hematoma after mass was removed from chest last July. He states hematoma continued to re-accumulate and patient was kept of AC by Dr. Barry Dienes for this reason. Son also reports patient is very safe ambulating in his home environment despite visual deficits because he is thoroughly familiar with it and has counted steps and mapped out the home from memory. Son is agreeable to consideration of restarting anticoagulation. Stroke diagnosis and plan of care were discussed with patient and son. Son states understanding and questions answered.   Vitals:   01/29/21 2313 01/30/21 0800 01/30/21 1107 01/30/21 1503  BP: (!) 152/72  (!) 102/58 138/71  Pulse: 69  70 69  Resp: 15 20 (!) 21 19  Temp: 98.2 F (36.8 C)  99.5 F (37.5 C) 98.1 F (36.7 C)  TempSrc: Oral  Oral Oral  SpO2: 95% 95% 94% 95%  Weight:      Height:       CBC:  Recent Labs  Lab 01/28/21 1045 01/28/21 1048  WBC 6.2  --   NEUTROABS 3.7  --   HGB 11.6* 11.9*  HCT 35.9* 35.0*  MCV 99.7  --   PLT 106*  --    Basic Metabolic Panel:  Recent Labs  Lab 01/28/21 1045 01/28/21 1048  NA 139 141  K 3.9 3.9  CL 103 103  CO2 26  --   GLUCOSE 105* 97  BUN 34* 35*  CREATININE 1.54* 1.40*  CALCIUM 9.0  --    Lipid Panel:  Recent Labs  Lab 01/29/21 0237  CHOL 188  TRIG 96  HDL 43  CHOLHDL 4.4  VLDL 19  LDLCALC 126*   HgbA1c:  Recent Labs  Lab 01/29/21 0237  HGBA1C 5.3   Urine Drug Screen:  Recent Labs  Lab 01/28/21 1146  LABOPIA NONE DETECTED  COCAINSCRNUR NONE DETECTED  LABBENZ NONE DETECTED  AMPHETMU NONE DETECTED  THCU NONE DETECTED   LABBARB POSITIVE*    Alcohol Level  Recent Labs  Lab 01/28/21 1045  ETH <10   01/28/21 Ammonia 9 - 35 umol/L 21    01/18/21 Component Ref Range & Units 1 d ago  (01/28/21) 6 mo ago  (07/27/20) 6 mo ago  (07/24/20) 6 mo ago  (07/17/20)  SARS Coronavirus 2 NEGATIVE NEGATIVE  NEGATIVE CM  NEGATIVE CM  NEGATIVE     Component Ref Range & Units 02:37 5 mo ago  B Natriuretic Peptide 0.0 - 100.0 pg/mL 391.1High  341.4High     IMAGING past 24 hours No results found. PHYSICAL EXAM GENERAL: Pleasant elderly Caucasian male not in distress., tremor present in bilateral upper extremities, head, and neck.   HEAD: - Normocephalic and atraumatic EENT: wears eyeglasses at baseline, normal conjunctiva, no OP obstruction. Dry mm. Hearing-aids in place bilaterally LUNGS - Normal respiratory effort without labored breathing CV - regular rate and rhythm noted on cardiac monitor ABDOMEN - Soft, non-tender, non-distended Ext: warm, well perfused  NEURO:  Mental Status: alert, speech is aphasic, one word answers occasionally with incomprehensible speech when he attempts to vocalize. Decreased vocalization. Follows loudly spoken commands intermittently Speech/Language: Broca's  aphasia- garbled, mostly moans with vocalization. Does not produce any comprehensible speech. Cranial Nerves:  II: PERRL 2 mm -> 1 mm. Attends to stimuli in all visual fields.  III, IV, VI: EOMI without ptosis. (Initially with leftward gaze palsy, unable to cross midline. 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 VII: Face is asymmetric with partial paresis of right mouth and nasolabial fold initially; stuttering periodically with resolution and recurrence during examination.   VIII: Hard of hearing at baseline but intact to loud voice with hearing-aids. IX, X: Palate elevation  is symmetric. XI: Normal sternocleidomastoid and trapezius muscle strength XII: Tongue protrudes midline Motor: 5/5 strength in bilateral upper and lower extremities with multiple attempts; limited assessment initially due to hard of hearing without hearing- . Antigravity movement present in all extremities without pronator drift. Essential tremor is noted in bilateral upper extremities right more than left, neck, and head, and face increasing with intention- this is baseline for patient. Tone is normal. Bulk is normal.  Sensation- Patient does not answer questions about sensation lateralization/quality.  Coordination: Patient does not follow complex commands; unable to assess coordination with FNF, HKS. DTRs: 2+ bilateral upper extremities biceps and brachioradialis, unable to obtain patellae reflexes. Gait- deferred but patient able to stand and balance with minimal assistance.  ASSESSMENT/PLAN: Douglas Walker is a 87 y.o. male with medical history significant of paroxysmal A. Fib (not on systemic anticoagulation due to frequent falls), baseline hearing and visual deficits, peripheral neuropathy, essential tremor, SSS S/P DDDPPM (2019), HTN, CKD stage IIIB, CHF presented with altered mental status.  Patient was found confused and aphasic with drooling and impaired balance.   Left M3/M4 superior division MCA occlusion, cardioembolic in the setting of A. fib not on anticoagulation   Per risk/benefit discussion with son will start Eliquis for anticoagulation as hematoma incident is remote and after discussion falls do not seem to be an ongoing issue.     Code Stroke: CT head No acute abnormality.   CTA head & neck: 1. Left M3/4 branch occlusion with 25 cc of ischemic range cerebral perfusion. 2. No emergent large vessel occlusion. 3. Atherosclerosis without proximal flow limiting stenosis.  CT perfusion:  1. Left M3/4 branch occlusion with 25 cc of ischemic range  cerebral perfusion. 2. No emergent large vessel occlusion. 3. Atherosclerosis without proximal flow limiting stenosis  2D Echo:EF by estimation, is 50 to 55%.Left atrial size was moderately dilated. Right atrial size was moderately dilated  MRI -was not obtained due to confusion and agitation and also has Pacemaker. As this will not change the plan will not pursue.   LDL 126  HgbA1c 5.3  Swallow eval: Passed for puree diet.   VTE prophylaxis - Is recommendedNo AC prior to admission in setting of known afib due to one episode of hematoma on back and frequent falls  ASA 81 mg daily po if he passes swallow eval   Therapy recommendations:  Inpatient rehab  Disposition:  CIR   Hypertension  Home meds:  None (stopped for several months with family monitoring daily) . Permissive hypertension (OK if < 220/120) but gradually normalize in 5-7 days . Long-term BP goal normotensive  Hyperlipidemia  Home meds:  None  LDL 126, goal < 70  Pending swallow eval  Other Stroke Risk Factors  Advanced Age >/= 91   CHF,  Diastolic, listed [id number]  History of TIA  He quit smoking 58 years ago   Other Active Problems Per Dr. Lyn Records admission consultation: #Goals of Care: "Mr. Arens, and he has been very clear that he would not want aggressive end-of-life measures and would prefer a Palliative and comfort care route, including the fact that he would not want a feeding tube."   #Possible UTI Large leukocytes and rare bacteria on UA on admission.  Monitor and consider sending culture    01/30/2021 3:16 PM I have personally obtained history,examined this patient, reviewed notes, independently viewed imaging studies, participated in medical decision making and plan of care.ROS completed by me personally and pertinent positives fully documented  I have made any additions or clarifications directly to the above note. Agree with note above.  Long d/w pt and son about risk benefit of  anticoagulation in AFIB and stroke prevention versus hemorrhage risk .Start eliquis pt and son agree.I have spent a total of  25  minutes with the patient reviewing hospital notes,  test results, labs and examining the patient as well as establishing an assessment and plan that was discussed personally with the patient.  > 50% of time was spent in direct patient care.      Antony Contras, MD Medical Director [facility] Pager: [phone] 01/30/2021 [facility] day # 2  To contact Stroke Continuity provider, please refer to [email or web address] After hours, contact General Neurology

## 2021-01-30 NOTE — Progress Notes (Signed)
STROKE TEAM PROGRESS NOTE   INTERVAL HISTORY No acute events since arrival.  Patient is sitting up in chair with son at bedside. He is able to speak a 3 word sentence today but still with moderate to severe receptive and expressive aphasia. Mimics well for exam. He is wearing his hearing aids today.  His son reports patient was taken off warfarin primarily due to postoperative hematoma after mass was removed from chest last July. He states hematoma continued to re-accumulate and patient was kept of AC by Dr. Barry Dienes for this reason. Son also reports patient is very safe ambulating in his home environment despite visual deficits because he is thoroughly familiar with it and has counted steps and mapped out the home from memory. Son is agreeable to consideration of restarting anticoagulation. Stroke diagnosis and plan of care were discussed with patient and son. Son states understanding and questions answered.   Vitals:   01/29/21 2313 01/30/21 0800 01/30/21 1107 01/30/21 1503  BP: (!) 152/72  (!) 102/58 138/71  Pulse: 69  70 69  Resp: 15 20 (!) 21 19  Temp: 98.2 F (36.8 C)  99.5 F (37.5 C) 98.1 F (36.7 C)  TempSrc: Oral  Oral Oral  SpO2: 95% 95% 94% 95%  Weight:      Height:       CBC:  Recent Labs  Lab 01/28/21 1045 01/28/21 1048  WBC 6.2  --   NEUTROABS 3.7  --   HGB 11.6* 11.9*  HCT 35.9* 35.0*  MCV 99.7  --   PLT 106*  --    Basic Metabolic Panel:  Recent Labs  Lab 01/28/21 1045 01/28/21 1048  NA 139 141  K 3.9 3.9  CL 103 103  CO2 26  --   GLUCOSE 105* 97  BUN 34* 35*  CREATININE 1.54* 1.40*  CALCIUM 9.0  --    Lipid Panel:  Recent Labs  Lab 01/29/21 0237  CHOL 188  TRIG 96  HDL 43  CHOLHDL 4.4  VLDL 19  LDLCALC 126*   HgbA1c:  Recent Labs  Lab 01/29/21 0237  HGBA1C 5.3   Urine Drug Screen:  Recent Labs  Lab 01/28/21 1146  LABOPIA NONE DETECTED  COCAINSCRNUR NONE DETECTED  LABBENZ NONE DETECTED  AMPHETMU NONE DETECTED  THCU NONE DETECTED   LABBARB POSITIVE*    Alcohol Level  Recent Labs  Lab 01/28/21 1045  ETH <10   01/28/21 Ammonia 9 - 35 umol/L 21    01/18/21 Component Ref Range & Units 1 d ago  (01/28/21) 6 mo ago  (07/27/20) 6 mo ago  (07/24/20) 6 mo ago  (07/17/20)  SARS Coronavirus 2 NEGATIVE NEGATIVE  NEGATIVE CM  NEGATIVE CM  NEGATIVE     Component Ref Range & Units 02:37 5 mo ago  B Natriuretic Peptide 0.0 - 100.0 pg/mL 391.1High  341.4High     IMAGING past 24 hours No results found. PHYSICAL EXAM GENERAL: Pleasant elderly Caucasian male not in distress., tremor present in bilateral upper extremities, head, and neck.   HEAD: - Normocephalic and atraumatic EENT: wears eyeglasses at baseline, normal conjunctiva, no OP obstruction. Dry mm. Hearing-aids in place bilaterally LUNGS - Normal respiratory effort without labored breathing CV - regular rate and rhythm noted on cardiac monitor ABDOMEN - Soft, non-tender, non-distended Ext: warm, well perfused  NEURO:  Mental Status: alert, speech is aphasic, one word answers occasionally with incomprehensible speech when he attempts to vocalize. Decreased vocalization. Follows loudly spoken commands intermittently Speech/Language: Broca's  aphasia- garbled, mostly moans with vocalization. Does not produce any comprehensible speech. Cranial Nerves:  II: PERRL 2 mm -> 1 mm. Attends to stimuli in all visual fields.  III, IV, VI: EOMI without ptosis. (Initially with leftward gaze palsy, unable to cross midline. 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 VII: Face is asymmetric with partial paresis of right mouth and nasolabial fold initially; stuttering periodically with resolution and recurrence during examination.   VIII: Hard of hearing at baseline but intact to loud voice with hearing-aids. IX, X: Palate elevation  is symmetric. XI: Normal sternocleidomastoid and trapezius muscle strength XII: Tongue protrudes midline Motor: 5/5 strength in bilateral upper and lower extremities with multiple attempts; limited assessment initially due to hard of hearing without hearing- . Antigravity movement present in all extremities without pronator drift. Essential tremor is noted in bilateral upper extremities right more than left, neck, and head, and face increasing with intention- this is baseline for patient. Tone is normal. Bulk is normal.  Sensation- Patient does not answer questions about sensation lateralization/quality.  Coordination: Patient does not follow complex commands; unable to assess coordination with FNF, HKS. DTRs: 2+ bilateral upper extremities biceps and brachioradialis, unable to obtain patellae reflexes. Gait- deferred but patient able to stand and balance with minimal assistance.  ASSESSMENT/PLAN: Douglas Walker is a 87 y.o. male with medical history significant of paroxysmal A. Fib (not on systemic anticoagulation due to frequent falls), baseline hearing and visual deficits, peripheral neuropathy, essential tremor, SSS S/P DDDPPM (2019), HTN, CKD stage IIIB, CHF presented with altered mental status.  Patient was found confused and aphasic with drooling and impaired balance.   Left M3/M4 superior division MCA occlusion, cardioembolic in the setting of A. fib not on anticoagulation   Per discussion with son will start Eliquis for anticoagulation as hematoma incident is remote and after discussion falls do not seem to be an ongoing issue.     Code Stroke: CT head No acute abnormality.   CTA head & neck: 1. Left M3/4 branch occlusion with 25 cc of ischemic range cerebral perfusion. 2. No emergent large vessel occlusion. 3. Atherosclerosis without proximal flow limiting stenosis.  CT perfusion:  1. Left M3/4 branch occlusion with 25 cc of ischemic range cerebral perfusion. 2. No  emergent large vessel occlusion. 3. Atherosclerosis without proximal flow limiting stenosis  2D Echo:EF by estimation, is 50 to 55%.Left atrial size was moderately dilated. Right atrial size was moderately dilated  MRI -was not obtained due to confusion and agitation and also has Pacemaker. As this will not change the plan will not pursue.   LDL 126  HgbA1c 5.3  Swallow eval: Passed for puree diet.   VTE prophylaxis - Is recommendedNo AC prior to admission in setting of known afib due to one episode of hematoma on back and frequent falls  ASA 81 mg daily po if he passes swallow eval   Therapy recommendations:  Inpatient rehab  Disposition:  CIR   Hypertension  Home meds:  None (stopped for several months with family monitoring daily) . Permissive hypertension (OK if < 220/120) but gradually normalize in 5-7 days . Long-term BP goal normotensive  Hyperlipidemia  Home meds:  None  LDL 126, goal < 70  Pending swallow eval  Other Stroke Risk Factors  Advanced Age >/= 79   CHF, Diastolic,  listed 2016  History of TIA  He quit smoking 58 years ago   Other Active Problems Per Dr. Lyn Records admission consultation: #Goals of Care: "Mr. Prindle, and he has been very clear that he would not want aggressive end-of-life measures and would prefer a Palliative and comfort care route, including the fact that he would not want a feeding tube."   #Possible UTI Large leukocytes and rare bacteria on UA on admission.  Monitor and consider sending culture    01/30/2021 3:06 PM   Hospital day # 2  To contact Stroke Continuity provider, please refer to [email or web address] After hours, contact General Neurology

## 2021-01-30 NOTE — Consult Note (Addendum)
Physical Medicine and Rehabilitation Consult Reason for Consult: Gait abnormality, right facial droop and aphasia Referring Physician: [facility]   HPI: Douglas Walker is a 87 y.o. right-handed male with history of essential tremor maintained on primidone, chronic kidney disease stage III, legally blind, CHF, CAD, PAF on Coumadin in the past but discontinued due to hematoma on his back, GERD hypertension hyperlipidemia TIA tachycardia syndrome with permanent pacemaker.  Per chart review patient lives with his son.  1 level home.  Family assistance as needed.  Used a rolling walker at baseline.  He attends to his ADLs independently.  Presented 01/28/2021 with right facial droop and aphasia of acute onset.  Cranial CT scan negative for acute changes.  CT angiogram of head and neck no emergent large vessel occlusion.  Echocardiogram with ejection fraction of 50 to 55% no wall motion abnormalities.  Admission chemistries unremarkable except glucose 105 BUN 34 creatinine 1.54, lactic acid 1.0, ammonia 21.  Currently maintained on aspirin for CVA prophylaxis.  Subcutaneous heparin for DVT prophylaxis.  Dysphagia #1 thin liquid diet.  Due to patient decrease in functional mobility physical medicine rehab consult requested.   Review of Systems  HENT: Positive for hearing loss.   Eyes:       Legally blind  Respiratory: Negative for cough and shortness of breath.   Cardiovascular: Positive for palpitations. Negative for chest pain and leg swelling.  Gastrointestinal: Positive for constipation. Negative for heartburn, nausea and vomiting.       GERD  Genitourinary: Positive for urgency. Negative for dysuria, flank pain and hematuria.  Musculoskeletal: Positive for back pain.  Neurological: Positive for speech change and weakness.  All other systems reviewed and are negative.  Past Medical History:  Diagnosis Date  . Arthritis    shoulders and back  . Cancer ([place])    skin cancers  . Chronic  low back pain 12/15/2017  . CKD (chronic kidney disease) stage 3, GFR 30-59 ml/min (HCC)   . Coronary artery disease   . Dysrhythmia     Paroxysmal atrial fibrillation; Tachycardia-bradycardia syndrome  . Essential tremor 02/28/2016  . GERD (gastroesophageal reflux disease)   . Hernia   . History of hiatal hernia   . Hyperlipemia   . Hypertension   . Macular degeneration   . Neuromuscular disorder (HCC)    neuropathy  . Paroxysmal atrial fibrillation ([place]) 10/28/2013  . Presence of permanent cardiac pacemaker 12/04/2018  . Tachycardia-bradycardia syndrome ([place]) 09/13/2014  . TIA (transient ischemic attack)    Past Surgical History:  Procedure Laterality Date  . APPENDECTOMY    . EYE SURGERY    . HERNIA REPAIR    . HERNIA REPAIR    . INSERT / REPLACE / REMOVE PACEMAKER  12/04/2018  . IRRIGATION AND DEBRIDEMENT ABSCESS Right 07/24/2020   Procedure: IRRIGATION AND DEBRIDEMENT HEMATOMA;  Surgeon: Coralie Keens, MD;  Location: [facility];  Service: General;  Laterality: Right;  . MASS EXCISION Right 07/20/2020   Procedure: EXCISION OF RIGHT CHEST WALL MASS;  Surgeon: Stark Klein, MD;  Location: [facility];  Service: General;  Laterality: Right;  . PACEMAKER IMPLANT N/A 12/04/2018   Procedure: PACEMAKER IMPLANT;  Surgeon: Evans Lance, MD;  Location: [facility];  Service: Cardiovascular;  Laterality: N/A;   Family History  Problem Relation Age of Onset  . GI problems Mother   . Other Sister        PAIN ISSUES  . Hearing loss Sister   .  Blindness Sister   . Heart attack Neg Hx   . Stroke Neg Hx    Social History:  reports that he quit smoking about 58 years ago. He has never used smokeless tobacco. He reports that he does not drink alcohol and does not use drugs. Allergies:  Allergies  Allergen Reactions  . Sulfa Antibiotics Other (See Comments)    Weakness, dizziness  . Primidone     Other reaction(s): increased sedation  . Simvastatin     Other reaction(s): arthralgias  and fatigue, severe   Medications Prior to Admission  Medication Sig Dispense Refill  . acetaminophen (TYLENOL) 500 MG tablet Take 1,000 mg by mouth 2 (two) times daily as needed for moderate pain.    Marland Kitchen aspirin EC 81 MG tablet Take 81 mg by mouth daily. Swallow whole.    . beta carotene w/minerals (OCUVITE) tablet Take 1 tablet by mouth daily.     . DULoxetine (CYMBALTA) 20 MG capsule Take 20 mg by mouth in the morning, at noon, and at bedtime.    . finasteride (PROSCAR) 5 MG tablet Take 5 mg by mouth daily.    . furosemide (LASIX) 20 MG tablet Take 60 mg by mouth daily.     Marland Kitchen levothyroxine (SYNTHROID) 25 MCG tablet TAKE 1 TABLET (25 MCG TOTAL) BY MOUTH DAILY BEFORE BREAKFAST. 90 tablet 1  . Multiple Vitamin (MULTIVITAMIN WITH MINERALS) TABS tablet Take 1 tablet by mouth daily.    . pregabalin (LYRICA) 25 MG capsule TAKE 1 CAPSULE(25 MG) BY MOUTH AT BEDTIME (Patient taking differently: Take 25 mg by mouth daily.) 90 capsule 1  . primidone (MYSOLINE) 250 MG tablet Take 250 mg by mouth 2 (two) times daily.  11  . senna (SENOKOT) 8.6 MG tablet Take 1 tablet by mouth daily as needed for constipation.    . Tamsulosin HCl (FLOMAX) 0.4 MG CAPS Take 0.4 mg by mouth 2 (two) times daily.     Marland Kitchen topiramate (TOPAMAX) 50 MG tablet Take 1 tablet (50 mg total) by mouth 2 (two) times daily. 180 tablet 3  . vitamin B-12 (CYANOCOBALAMIN) 1000 MCG tablet Take 1,000 mcg by mouth every other day.      Home: Home Living Family/patient expects to be discharged to:: Private residence Living Arrangements: Children Available Help at Discharge: Family,Available 24 hours/day Type of Home: House Home Access: Level entry [place]: One level Bathroom Shower/Tub: Multimedia programmer: Handicapped height [place]: Shower seat,Grab bars - toilet,Walker - 2 wheels,Cane - single point,Bedside commode,Grab bars - tub/shower,Hand held shower head,Wheelchair - manual  Functional History: Prior  Function Level of Independence: Needs assistance Gait / Transfers Assistance Needed: Uses RW at baseline. ADL's / Homemaking Assistance Needed: Completed ADLs with Mod I. Pt son assists with IADL's i.e. cooking. Functional Status:  Mobility: Bed Mobility Overal bed mobility: Needs Assistance Bed Mobility: Supine to Sit Supine to sit: Min guard General bed mobility comments: OOB in recliner upon entry Transfers Overall transfer level: Needs assistance Equipment used: Rolling walker (2 wheeled) Transfers: Sit to/from Stand Sit to Stand: Min assist Stand pivot transfers: Min guard General transfer comment: MinA to boost to standing with RW, good carryover of hand placement form OT session; condom cath immediately slid off onto the floor when we stood up Ambulation/Gait Ambulation/Gait assistance: Min Web designer (Feet): 80 Feet Assistive device: Rolling walker (2 wheeled) Gait Pattern/deviations: Step-through pattern,Decreased step length - right,Decreased step length - left,Narrow base of support,Trendelenburg,Drifts right/left,Trunk flexed General Gait Details: slow  with RW, able to maintain safe proximity from device but with strong drift to the right; ran into obstacles in the hallway multiple times and was surprised (question R sided vision impairment post-CVA) and needed MinA/Max cues to avoid running into other obstacles on his right side. VSS on RA. Gait velocity: decreased    ADL:    Cognition: Cognition Overall Cognitive Status: Difficult to assess Orientation Level: Oriented to person,Oriented to situation Cognition Arousal/Alertness: Awake/alert Behavior During Therapy: Flat affect,Impulsive Overall Cognitive Status: Difficult to assess General Comments: makes multiple attempts to verbalize but gets frustrated when he cannot get the words out due to aphasia. Cognition and cue following difficult to assess due to combination of aphasia and being very HOH. Mild  impulsivity with transfers. Difficult to assess due to: Hard of hearing/deaf (aphasic)  Blood pressure (!) 152/72, pulse 69, temperature 98.2 F (36.8 C), temperature source Oral, resp. rate 15, height 5' 10.5" (1.791 m), weight 92.3 kg, SpO2 95 %. Physical Exam Constitutional:      General: He is not in acute distress. HENT:     Head: Normocephalic.     Right Ear: External ear normal.     Left Ear: External ear normal.     Nose: Nose normal.  Cardiovascular:     Rate and Rhythm: Normal rate.     Heart sounds: No murmur heard. No gallop.   Pulmonary:     Effort: No respiratory distress.  Abdominal:     General: There is no distension.  Musculoskeletal:     Cervical back: Normal range of motion.  Neurological:     Mental Status: He is alert.     Comments: Patient is alert and hard of hearing.  Makes eye contact with examiner. Continuous facial tic/tremor. Right facial droop. Aphasic but will occasionally utter something spontaneously which is pertinent to conversation. Vision is impaired. He tracks more easily to right than left. Asked him to ID his styrofoam drinking cup. He could not, but reached for it and sipped from the straw without any problems. RUE grossly 3+ to 4-/5 with delays in movement, apraxia. RLE 2 to 3-/5 prox to distal with apraxia. When I pinched his right leg he said "pinch". DTR's 2+  Psychiatric:     Comments: Pt was flat but cooperative     No results found for this or any previous visit (from the past 24 hour(s)). CT Code Stroke CTA Head W/WO contrast  Result Date: 01/28/2021 CLINICAL DATA:  Facial droop.  Acute stroke suspected EXAM: CT ANGIOGRAPHY HEAD AND NECK CT PERFUSION BRAIN TECHNIQUE: Multidetector CT imaging of the head and neck was performed using the standard protocol during bolus administration of intravenous contrast. Multiplanar CT image reconstructions and MIPs were obtained to evaluate the vascular anatomy. Carotid stenosis measurements (when  applicable) are obtained utilizing NASCET criteria, using the distal internal carotid diameter as the denominator. Multiphase CT imaging of the brain was performed following IV bolus contrast injection. Subsequent parametric perfusion maps were calculated using RAPID software. CONTRAST:  Dose is currently not known COMPARISON:  Head CT from earlier today FINDINGS: CTA NECK FINDINGS Aortic arch: Atheromatous plaque with 3 vessel branching. Right carotid system: Mild for age atheromatous plaque mainly at the bifurcation where there is mild proximal ICA narrowing mainly due to wasting rather than luminal plaque buildup. No ulceration. Left carotid system: Atheromatous wall thickening of the common carotid and proximal ICA, overall mild for age. No stenosis or ulceration. Vertebral arteries: No proximal subclavian stenosis.  Mild left vertebral artery dominance. Scattered atheromatous calcifications of the vertebral arteries without flow limiting stenosis. No beading or dissection. Skeleton: No acute finding Other neck: No evidence of inflammation or mass. Upper chest: Negative Review of the MIP images confirms the above findings CTA HEAD FINDINGS Anterior circulation: Atheromatous calcification of the bilateral carotid siphon. No large vessel occlusion, beading, or proximal stenosis. A left M3/4 branch occlusion is noted, as marked on sagittal reformats. Posterior circulation: Mild left vertebral artery dominance. Vertebral and basilar arteries are smooth and widely patent. Fetal type right PCA. No branch occlusion, beading, or aneurysm Venous sinuses: Diffusely patent Anatomic variants: As above Review of the MIP images confirms the above findings CT Brain Perfusion Findings: ASPECTS: 10 CBF (<30%) Volume: 43mL Perfusion (Tmax>6.0s) volume: 74mL Mismatch Volume: 17mL IMPRESSION: 1. Left M3/4 branch occlusion with 25 cc of ischemic range cerebral perfusion. 2. No emergent large vessel occlusion. 3. Atherosclerosis  without proximal flow limiting stenosis. Electronically Signed   By: Monte Fantasia M.D.   On: 01/28/2021 11:25   DG Chest 1 View  Result Date: 01/28/2021 CLINICAL DATA:  Code stroke. EXAM: CHEST  1 VIEW COMPARISON:  Chest x-ray dated 08/24/2020 FINDINGS: Stable cardiomegaly. Overall cardiomediastinal silhouette is stable in size and configuration. LEFT chest wall pacemaker/ICD apparatus appears stable in position. Lungs are clear.  No pleural effusion or pneumothorax is seen. IMPRESSION: No active cardiopulmonary disease. No evidence of pneumonia or pulmonary edema. Stable cardiomegaly. Electronically Signed   By: Franki Cabot M.D.   On: 01/28/2021 11:46   CT Code Stroke CTA Neck W/WO contrast  Result Date: 01/28/2021 CLINICAL DATA:  Facial droop.  Acute stroke suspected EXAM: CT ANGIOGRAPHY HEAD AND NECK CT PERFUSION BRAIN TECHNIQUE: Multidetector CT imaging of the head and neck was performed using the standard protocol during bolus administration of intravenous contrast. Multiplanar CT image reconstructions and MIPs were obtained to evaluate the vascular anatomy. Carotid stenosis measurements (when applicable) are obtained utilizing NASCET criteria, using the distal internal carotid diameter as the denominator. Multiphase CT imaging of the brain was performed following IV bolus contrast injection. Subsequent parametric perfusion maps were calculated using RAPID software. CONTRAST:  Dose is currently not known COMPARISON:  Head CT from earlier today FINDINGS: CTA NECK FINDINGS Aortic arch: Atheromatous plaque with 3 vessel branching. Right carotid system: Mild for age atheromatous plaque mainly at the bifurcation where there is mild proximal ICA narrowing mainly due to wasting rather than luminal plaque buildup. No ulceration. Left carotid system: Atheromatous wall thickening of the common carotid and proximal ICA, overall mild for age. No stenosis or ulceration. Vertebral arteries: No proximal  subclavian stenosis. Mild left vertebral artery dominance. Scattered atheromatous calcifications of the vertebral arteries without flow limiting stenosis. No beading or dissection. Skeleton: No acute finding Other neck: No evidence of inflammation or mass. Upper chest: Negative Review of the MIP images confirms the above findings CTA HEAD FINDINGS Anterior circulation: Atheromatous calcification of the bilateral carotid siphon. No large vessel occlusion, beading, or proximal stenosis. A left M3/4 branch occlusion is noted, as marked on sagittal reformats. Posterior circulation: Mild left vertebral artery dominance. Vertebral and basilar arteries are smooth and widely patent. Fetal type right PCA. No branch occlusion, beading, or aneurysm Venous sinuses: Diffusely patent Anatomic variants: As above Review of the MIP images confirms the above findings CT Brain Perfusion Findings: ASPECTS: 10 CBF (<30%) Volume: 33mL Perfusion (Tmax>6.0s) volume: 30mL Mismatch Volume: 67mL IMPRESSION: 1. Left M3/4 branch occlusion with 25 cc of  ischemic range cerebral perfusion. 2. No emergent large vessel occlusion. 3. Atherosclerosis without proximal flow limiting stenosis. Electronically Signed   By: Monte Fantasia M.D.   On: 01/28/2021 11:25   CT Code Stroke Cerebral Perfusion with contrast  Result Date: 01/28/2021 CLINICAL DATA:  Facial droop.  Acute stroke suspected EXAM: CT ANGIOGRAPHY HEAD AND NECK CT PERFUSION BRAIN TECHNIQUE: Multidetector CT imaging of the head and neck was performed using the standard protocol during bolus administration of intravenous contrast. Multiplanar CT image reconstructions and MIPs were obtained to evaluate the vascular anatomy. Carotid stenosis measurements (when applicable) are obtained utilizing NASCET criteria, using the distal internal carotid diameter as the denominator. Multiphase CT imaging of the brain was performed following IV bolus contrast injection. Subsequent parametric perfusion  maps were calculated using RAPID software. CONTRAST:  Dose is currently not known COMPARISON:  Head CT from earlier today FINDINGS: CTA NECK FINDINGS Aortic arch: Atheromatous plaque with 3 vessel branching. Right carotid system: Mild for age atheromatous plaque mainly at the bifurcation where there is mild proximal ICA narrowing mainly due to wasting rather than luminal plaque buildup. No ulceration. Left carotid system: Atheromatous wall thickening of the common carotid and proximal ICA, overall mild for age. No stenosis or ulceration. Vertebral arteries: No proximal subclavian stenosis. Mild left vertebral artery dominance. Scattered atheromatous calcifications of the vertebral arteries without flow limiting stenosis. No beading or dissection. Skeleton: No acute finding Other neck: No evidence of inflammation or mass. Upper chest: Negative Review of the MIP images confirms the above findings CTA HEAD FINDINGS Anterior circulation: Atheromatous calcification of the bilateral carotid siphon. No large vessel occlusion, beading, or proximal stenosis. A left M3/4 branch occlusion is noted, as marked on sagittal reformats. Posterior circulation: Mild left vertebral artery dominance. Vertebral and basilar arteries are smooth and widely patent. Fetal type right PCA. No branch occlusion, beading, or aneurysm Venous sinuses: Diffusely patent Anatomic variants: As above Review of the MIP images confirms the above findings CT Brain Perfusion Findings: ASPECTS: 10 CBF (<30%) Volume: 73mL Perfusion (Tmax>6.0s) volume: 5mL Mismatch Volume: 30mL IMPRESSION: 1. Left M3/4 branch occlusion with 25 cc of ischemic range cerebral perfusion. 2. No emergent large vessel occlusion. 3. Atherosclerosis without proximal flow limiting stenosis. Electronically Signed   By: Monte Fantasia M.D.   On: 01/28/2021 11:25   ECHOCARDIOGRAM COMPLETE  Result Date: 01/28/2021    ECHOCARDIOGRAM REPORT   Patient Name:   ABHIJIT NOLTING Date of  Exam: 01/28/2021 Medical Rec #:  [id number]          Height:       70.5 in Accession #:    [id number]         Weight:       203.5 lb Date of Birth:  01-28-1925          BSA:          2.114 m Patient Age:    57 years           BP:           176/81 mmHg Patient Gender: M                  HR:           70 bpm. Exam Location:  Inpatient Procedure: 2D Echo Indications:    TIA 435.9  History:        Patient has prior history of Echocardiogram examinations, most  recent 05/19/2015. Chronic kidney disease, Arrythmias:Atrial                 Fibrillation; Risk Factors:Hypertension.  Sonographer:    Johny Chess Referring Phys: [id number] PING T ZHANG  Sonographer Comments: Image acquisition challenging due to uncooperative patient. IMPRESSIONS  1. Left ventricular ejection fraction, by estimation, is 50 to 55%. The left ventricle has low normal function. The left ventricle has no regional wall motion abnormalities. Left ventricular diastolic function could not be evaluated.  2. Right ventricular systolic function is mildly reduced. The right ventricular size is normal. There is moderately elevated pulmonary artery systolic pressure.  3. Left atrial size was moderately dilated.  4. Right atrial size was moderately dilated.  5. The mitral valve is normal in structure. No evidence of mitral valve regurgitation.  6. The aortic valve is tricuspid. Aortic valve regurgitation is mild. Mild aortic valve sclerosis is present, with no evidence of aortic valve stenosis.  7. Aortic dilatation noted. There is moderate dilatation of the ascending aorta, measuring 47 mm.  8. The inferior vena cava is dilated in size with <50% respiratory variability, suggesting right atrial pressure of 15 mmHg. Comparison(s): Prior images unable to be directly viewed, comparison made by report only. Ascending aorta dilation has worsened since 2019 (but measurement is similar to CT angio report from may 2021). FINDINGS  Left Ventricle: Left  ventricular ejection fraction, by estimation, is 50 to 55%. The left ventricle has low normal function. The left ventricle has no regional wall motion abnormalities. The left ventricular internal cavity size was normal in size. There is no left ventricular hypertrophy. Abnormal (paradoxical) septal motion, consistent with RV pacemaker. Left ventricular diastolic function could not be evaluated due to atrial fibrillation. Left ventricular diastolic function could not be evaluated. Right Ventricle: The right ventricular size is normal. No increase in right ventricular wall thickness. Right ventricular systolic function is mildly reduced. There is moderately elevated pulmonary artery systolic pressure. The tricuspid regurgitant velocity is 2.76 m/s, and with an assumed right atrial pressure of 15 mmHg, the estimated right ventricular systolic pressure is 15.1 mmHg. Left Atrium: Left atrial size was moderately dilated. Right Atrium: Right atrial size was moderately dilated. Pericardium: There is no evidence of pericardial effusion. Mitral Valve: The mitral valve is normal in structure. No evidence of mitral valve regurgitation. Tricuspid Valve: The tricuspid valve is normal in structure. Tricuspid valve regurgitation is mild. Aortic Valve: The aortic valve is tricuspid. Aortic valve regurgitation is mild. Mild aortic valve sclerosis is present, with no evidence of aortic valve stenosis. Pulmonic Valve: The pulmonic valve was not well visualized. Pulmonic valve regurgitation is not visualized. Aorta: Aortic dilatation noted. There is moderate dilatation of the ascending aorta, measuring 47 mm. Venous: The inferior vena cava is dilated in size with less than 50% respiratory variability, suggesting right atrial pressure of 15 mmHg. IAS/Shunts: No atrial level shunt detected by color flow Doppler. Additional Comments: A pacer wire is visualized.  LEFT VENTRICLE PLAX 2D LVIDd:         4.70 cm Diastology LVIDs:         3.40  cm LV e' medial:    5.87 cm/s LV PW:         1.30 cm LV E/e' medial:  21.0 LV IVS:        1.00 cm LV e' lateral:   10.40 cm/s  LV E/e' lateral: 11.8  RIGHT VENTRICLE            IVC RV S prime:     8.38 cm/s  IVC diam: 2.40 cm TAPSE (M-mode): 1.1 cm LEFT ATRIUM             Index       RIGHT ATRIUM           Index LA diam:        4.20 cm 1.99 cm/m  RA Area:     16.00 cm LA Vol (A2C):   63.8 ml 30.18 ml/m RA Volume:   41.30 ml  19.54 ml/m LA Vol (A4C):   67.7 ml 32.03 ml/m LA Biplane Vol: 65.7 ml 31.08 ml/m  AORTIC VALVE LVOT Vmax:   97.90 cm/s LVOT Vmean:  62.500 cm/s LVOT VTI:    0.228 m  AORTA Ao Asc diam: 4.70 cm MITRAL VALVE                TRICUSPID VALVE MV Area (PHT): 3.60 cm     TR Peak grad:   30.5 mmHg MV Decel Time: 211 msec     TR Vmax:        276.00 cm/s MV E velocity: 123.00 cm/s MV A velocity: 33.10 cm/s   SHUNTS MV E/A ratio:  3.72         Systemic VTI: 0.23 m Dani Gobble Croitoru MD Electronically signed by Sanda Klein MD Signature Date/Time: 01/28/2021/2:10:39 PM    Final    CT HEAD CODE STROKE WO CONTRAST  Result Date: 01/28/2021 CLINICAL DATA:  Code stroke. Facial droop and altered mental status, side not specified EXAM: CT HEAD WITHOUT CONTRAST TECHNIQUE: Contiguous axial images were obtained from the base of the skull through the vertex without intravenous contrast. COMPARISON:  07/18/2015 FINDINGS: Brain: No evidence of acute infarction, hemorrhage, hydrocephalus, extra-axial collection or mass lesion/mass effect. Generalized atrophy. Moderate chronic small vessel ischemia in the cerebral white matter. Vascular: No definite hyperdense vessel. A left M2 branch appears prominent in density on coronal reformats but not on the other projections. Skull: Normal. Negative for fracture or focal lesion. Sinuses/Orbits: No acute finding. Other: These results were communicated to Dr. Curly Shores at 11:00 amon 1/30/2022by text page via the [facility] messaging system. ASPECTS [facility]  Stroke Program Early CT Score) - Ganglionic level infarction (caudate, lentiform nuclei, internal capsule, insula, M1-M3 cortex): 7 - Supraganglionic infarction (M4-M6 cortex): 3 Total score (0-10 with 10 being normal): 10 IMPRESSION: 1. Aging brain without acute finding. 2. ASPECTS is 10 Electronically Signed   By: Monte Fantasia M.D.   On: 01/28/2021 11:02     Assessment/Plan: Diagnosis: Left cortical infarct, potentially embolic left MCA with right hemiparesis and aphasia 1. Does the need for close, 24 hr/day medical supervision in concert with the patient's rehab needs make it unreasonable for this patient to be served in a less intensive setting? Yes 2. Co-Morbidities requiring supervision/potential complications: CHF, HTN, PAF 3. Due to bladder management, bowel management, safety, skin/wound care, disease management, medication administration, pain management and patient education, does the patient require 24 hr/day rehab nursing? Yes 4. Does the patient require coordinated care of a physician, rehab nurse, therapy disciplines of PT, OT, SLP to address physical and functional deficits in the context of the above medical diagnosis(es)? Yes Addressing deficits in the following areas: balance, endurance, locomotion, strength, transferring, bowel/bladder control, bathing, dressing, feeding, grooming, toileting, cognition, speech, language and psychosocial support 5. Can the patient actively participate in  an intensive therapy program of at least 3 hrs of therapy per day at least 5 days per week? Yes 6. The potential for patient to make measurable gains while on inpatient rehab is excellent 7. Anticipated functional outcomes upon discharge from inpatient rehab are supervision  with PT, supervision with OT, min assist with SLP. 8. Estimated rehab length of stay to reach the above functional goals is: 8-13 days 9. Anticipated discharge destination: Home 10. Overall Rehab/Functional Prognosis:  excellent  RECOMMENDATIONS: This patient's condition is appropriate for continued rehabilitative care in the following setting: CIR Patient has agreed to participate in recommended program. Yes (son,family) Note that insurance prior authorization may be required for reimbursement for recommended care.  Comment: Pt was active and fairly independent prior to admit. He has good family supports. Rehab Admissions Coordinator to follow up.  Thanks,  Meredith Staggers, MD, Mellody Drown  I have personally performed a face to face diagnostic evaluation of this patient. Additionally, I have examined pertinent labs and radiographic images. I have reviewed and concur with the physician assistant's documentation above.    Lavon Paganini Angiulli, PA-C 01/30/2021

## 2021-01-31 NOTE — Plan of Care (Signed)
  Problem: Education: Goal: Knowledge of General Education information will improve Description: Including pain rating scale, medication(s)/side effects and non-pharmacologic comfort measures Outcome: Progressing   Problem: Health Behavior/Discharge Planning: Goal: Ability to manage health-related needs will improve Outcome: Progressing   Problem: Clinical Measurements: Goal: Ability to maintain clinical measurements within normal limits will improve Outcome: Progressing Goal: Will remain free from infection Outcome: Progressing Goal: Diagnostic test results will improve Outcome: Progressing Goal: Respiratory complications will improve Outcome: Progressing Goal: Cardiovascular complication will be avoided Outcome: Progressing   Problem: Activity: Goal: Risk for activity intolerance will decrease Outcome: Progressing   Problem: Nutrition: Goal: Adequate nutrition will be maintained Outcome: Progressing   Problem: Coping: Goal: Level of anxiety will decrease Outcome: Progressing   Problem: Elimination: Goal: Will not experience complications related to bowel motility Outcome: Progressing Goal: Will not experience complications related to urinary retention Outcome: Progressing   Problem: Pain Managment: Goal: General experience of comfort will improve Outcome: Progressing   Problem: Safety: Goal: Ability to remain free from injury will improve Outcome: Progressing   Problem: Skin Integrity: Goal: Risk for impaired skin integrity will decrease Outcome: Progressing   Problem: Education: Goal: Knowledge of disease or condition will improve Outcome: Progressing Goal: Knowledge of secondary prevention will improve Outcome: Progressing Goal: Knowledge of patient specific risk factors addressed and post discharge goals established will improve Outcome: Progressing Goal: Individualized Educational Video(s) Outcome: Progressing   Problem: Coping: Goal: Will identify  appropriate support needs Outcome: Progressing   Problem: Self-Care: Goal: Verbalization of feelings and concerns over difficulty with self-care will improve Outcome: Progressing Goal: Ability to communicate needs accurately will improve Outcome: Progressing   Problem: Nutrition: Goal: Dietary intake will improve Outcome: Progressing

## 2021-01-31 NOTE — Progress Notes (Signed)
Inpatient Rehab Admissions Coordinator:   Met with patient and his son at bedside.  We discussed recommendations for CIR versus outpatient and I explained the differences.  They are hopeful for CIR admission for intense therapy and verbalize understanding of supervision level goals with estimated length of stay to be about a week.  Pt lived with his son and family prior to admission and will return after CIR.  Pt was mod I at home, but family can provide 24/7 supervision at discharge from [facility].  Will follow for potential admission pending bed availability.   Shann Medal, PT, DPT Admissions Coordinator [phone] 01/31/21  3:05 PM

## 2021-01-31 NOTE — Progress Notes (Signed)
STROKE TEAM PROGRESS NOTE   INTERVAL HISTORY No acute events overnight. Patient is sitting up in chair bathing with OT. He is alert, cooperative and in NAD. His speech continues to improve today.   No visitors at bedside.  Vital signs are stable.  Neuro exam unchanged  Vitals:   01/30/21 1914 01/30/21 2314 01/31/21 0314 01/31/21 0715  BP: 128/64 137/63 136/66 (!) 145/68  Pulse: 70 70 70 70  Resp: 20 (!) 21 18 (!) 21  Temp: 98.1 F (36.7 C) 99.4 F (37.4 C)  98.2 F (36.8 C)  TempSrc: Oral Axillary  Oral  SpO2: 98% 98% 96% 95%  Weight:      Height:       CBC:  Recent Labs  Lab 01/28/21 1045 01/28/21 1048  WBC 6.2  --   NEUTROABS 3.7  --   HGB 11.6* 11.9*  HCT 35.9* 35.0*  MCV 99.7  --   PLT 106*  --    Basic Metabolic Panel:  Recent Labs  Lab 01/28/21 1045 01/28/21 1048  NA 139 141  K 3.9 3.9  CL 103 103  CO2 26  --   GLUCOSE 105* 97  BUN 34* 35*  CREATININE 1.54* 1.40*  CALCIUM 9.0  --    Lipid Panel:  Recent Labs  Lab 01/29/21 0237  CHOL 188  TRIG 96  HDL 43  CHOLHDL 4.4  VLDL 19  LDLCALC 126*   HgbA1c:  Recent Labs  Lab 01/29/21 0237  HGBA1C 5.3   Urine Drug Screen:  Recent Labs  Lab 01/28/21 1146  LABOPIA NONE DETECTED  COCAINSCRNUR NONE DETECTED  LABBENZ NONE DETECTED  AMPHETMU NONE DETECTED  THCU NONE DETECTED  LABBARB POSITIVE*    Alcohol Level  Recent Labs  Lab 01/28/21 1045  ETH <10   01/28/21 Ammonia 9 - 35 umol/L 21    01/18/21 Component Ref Range & Units 1 d ago  (01/28/21) 6 mo ago  (07/27/20) 6 mo ago  (07/24/20) 6 mo ago  (07/17/20)  SARS Coronavirus 2 NEGATIVE NEGATIVE  NEGATIVE CM  NEGATIVE CM  NEGATIVE     Component Ref Range & Units 02:37 5 mo ago  B Natriuretic Peptide 0.0 - 100.0 pg/mL 391.1High  341.4High     IMAGING past 24 hours No results found. PHYSICAL EXAM GENERAL: Pleasant elderly Caucasian male not in distress., tremor present in bilateral upper extremities, head, and neck.   HEAD: -  Normocephalic and atraumatic EENT: wears eyeglasses at baseline, normal conjunctiva, no OP obstruction. Dry mm. Hearing-aids in place bilaterally LUNGS - Normal respiratory effort without labored breathing CV - regular rate and rhythm noted on cardiac monitor ABDOMEN - Soft, non-tender, non-distended Ext: warm, well perfused  NEURO:  Mental Status: alert, speech is aphasic, one word answers occasionally with incomprehensible speech when he attempts to vocalize. Decreased vocalization. Follows loudly spoken commands intermittently Speech/Language: Broca's aphasia- garbled, mostly moans with vocalization. Does not produce any comprehensible speech. Cranial Nerves:  II: PERRL 2 mm -> 1 mm. Attends to stimuli in all visual fields.  III, IV, VI: EOMI without ptosis. (Initially with leftward gaze palsy, unable to cross midline. 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 VII: Face is asymmetric with partial paresis of right mouth and nasolabial fold initially; stuttering periodically with resolution and recurrence during examination.   VIII: Hard of  hearing at baseline but intact to loud voice with hearing-aids. IX, X: Palate elevation is symmetric. XI: Normal sternocleidomastoid and trapezius muscle strength XII: Tongue protrudes midline Motor: 5/5 strength in bilateral upper and lower extremities with multiple attempts; limited assessment initially due to hard of hearing without hearing- . Antigravity movement present in all extremities without pronator drift. Essential tremor is noted in bilateral upper extremities right more than left, neck, and head, and face increasing with intention- this is baseline for patient. Tone is normal. Bulk is normal.  Sensation- Patient does not answer questions about sensation lateralization/quality.  Coordination:  Patient does not follow complex commands; unable to assess coordination with FNF, HKS.  ASSESSMENT/PLAN: Douglas Walker is a 87 y.o. male with medical history significant of paroxysmal A. Fib (not on systemic anticoagulation due to frequent falls), baseline hearing and visual deficits, peripheral neuropathy, essential tremor, SSS S/P DDDPPM (2019), HTN, CKD stage IIIB, CHF presented with altered mental status.  Patient was found confused and aphasic with drooling and impaired balance.   Left M3/M4 superior division MCA occlusion, cardioembolic in the setting of A. fib not on anticoagulation   Per risk/benefit discussion  Eliquis was started for anticoagulation as hematoma incident is remote and after discussion falls do not seem to be an ongoing issue.     Code Stroke: CT head No acute abnormality.   CTA head & neck: 1. Left M3/4 branch occlusion with 25 cc of ischemic range cerebral perfusion. 2. No emergent large vessel occlusion. 3. Atherosclerosis without proximal flow limiting stenosis.  CT perfusion:  1. Left M3/4 branch occlusion with 25 cc of ischemic range cerebral perfusion. 2. No emergent large vessel occlusion. 3. Atherosclerosis without proximal flow limiting stenosis  2D Echo:EF by estimation, is 50 to 55%.Left atrial size was moderately dilated. Right atrial size was moderately dilated  MRI -was not obtained due to confusion and agitation and also has Pacemaker. As this will not change the plan will not pursue.   LDL 126  HgbA1c 5.3  Swallow eval: Passed for puree diet.   VTE prophylaxis - Is recommendedNo AC prior to admission in setting of known afib due to one episode of hematoma on back and frequent falls  ASA 81 mg daily po if he passes swallow eval   Therapy recommendations:  Inpatient rehab  Disposition:  CIR   Hypertension  Home meds:  None (stopped for several months with family monitoring daily)  BP stable 761-607 systolic  . Permissive  hypertension (OK if < 220/120) but gradually normalize in 5-7 days . Long-term BP goal normotensive  Hyperlipidemia  Home meds:  None  LDL 126, goal < 70  Pending swallow eval  Other Stroke Risk Factors  Advanced Age >/= 17   CHF, Diastolic, listed 3710  History of TIA  He quit smoking 58 years ago   Other Active Problems Per Dr. Lyn Records admission consultation: #Goals of Care: "Mr. Kaufhold, and he has been very clear that he would not want aggressive end-of-life measures and would prefer a Palliative and comfort care route, including the fact that he would not want a feeding tube."   Hospital day # 3 Continue Eliquis for stroke prevention.  Mobilize out of bed.  Continue ongoing therapies.  Stroke team will sign off.  Kindly call for questions.  Follow-up as outpatient stroke clinic in 6 weeks  Antony Contras, MD To contact Stroke Continuity provider, please refer to [email or web address] After hours, contact General Neurology

## 2021-01-31 NOTE — Progress Notes (Signed)
PROGRESS NOTE  Douglas Walker [id number] DOB: [birth year]   PCP: Josetta Huddle, MD  Patient is from: Home  DOA: 01/28/2021 LOS: 3  Chief complaints: Altered mental status and aphasia  Brief Narrative / Interim history: 87 year old male with PMH of paroxysmal A. fib not on AC due to frequent falls, peripheral neuropathy, SSS s/p PPM, CKD-3B and essential tremor brought to ED due to altered mental status and aphasia, and found to have acute left MCA branch CVA with CTA and CT perfusion study suggesting M3-4 occlusion.  Neurology recommended recommended Eliquis. Therapy recommended CIR.  SLP recommended dysphagia 1 diet.  Subjective: Seen and examined earlier this morning.  No major events overnight of this morning.  No complaints but patient with severe expressive aphasia not able to verbalize.  Appropriately follows commands.  Does not appear to be in distress.  Objective: Vitals:   01/30/21 2314 01/31/21 0314 01/31/21 0715 01/31/21 1201  BP: 137/63 136/66 (!) 145/68 125/68  Pulse: 70 70 70 71  Resp: (!) 21 18 (!) 21 16  Temp: 99.4 F (37.4 C)  98.2 F (36.8 C) 98.3 F (36.8 C)  TempSrc: Axillary  Oral Oral  SpO2: 98% 96% 95% 96%  Weight:      Height:        Intake/Output Summary (Last 24 hours) at 01/31/2021 1422 Last data filed at 01/31/2021 0956 Gross per 24 hour  Intake -  Output 1050 ml  Net -1050 ml   Filed Weights   01/28/21 1201  Weight: 92.3 kg    Examination:  GENERAL: No apparent distress.  Nontoxic. HEENT: MMM.  Vision and hearing grossly intact.  NECK: Supple.  No apparent JVD.  RESP:  No IWOB.  Fair aeration bilaterally. CVS: Regular rhythm.  Normal rate. Heart sounds normal.  ABD/GI/GU: BS+. Abd soft, NTND.  MSK/EXT:  Moves extremities. No apparent deformity. No edema.  SKIN: no apparent skin lesion or wound NEURO: Awake and alert.  Severe expressive aphasia.  No facial asymmetry.  Motor and patellar reflex grossly intact. PSYCH: Calm. Normal  affect.  Procedures:  None  Microbiology summarized: COVID-19 PCR nonreactive.  Assessment & Plan: Acute left MCA CVA in patient with known history of PAF not on anticoagulation: Likely ischemic but at risk for embolic CVA as well. CTA and CT perfusion study suggesting M3-4 occlusion.  Presented with acute encephalopathy and aphasia.  TTE without significant finding.  LDL 126.  A1c 5.3.  Still with significant aphasia and dysphagia. -Started on Eliquis by neurology -Therapy recommended CIR -On dysphagia 1 diet.  Chronic diastolic CHF: No mention of diastolic dysfunction on his TTE in 2016.  Diastolic function could not be evaluated on current TTE.  Appears to be on p.o. Lasix 60 mg daily at home from PCP which has been on hold since admission.  Does not appear fluid overloaded.  No cardiopulmonary symptoms. -Continue holding Lasix -Monitor fluid and respiratory status  Dysphagia due to CVA-SLP recommended dysphagia 1 diet. -We will continue  Paroxysmal A. Fib: Not on medication for this.  -Started on Eliquis.  History of SSS s/p PPM -Noted.  Essential hypertension: BP within acceptable range.  CKD-3B: Stable.  Essential tremor: Stable. -Continue home medication  Hypothyroidism: Stable. -Continue home Synthroid  Debility -CIR for PT/OT  BPH: Stable -Continue home meds.   Body mass index is 28.78 kg/m.         DVT prophylaxis:   apixaban (ELIQUIS) tablet 5 mg  Code Status: DNR/DNI Family Communication: Updated patient's  son at bedside on 2/2. Level of care: Progressive Status is: Inpatient  Remains inpatient appropriate because:Unsafe d/c plan   Dispo: The patient is from: Home              Anticipated d/c is to: CIR              Anticipated d/c date is: 1 day              Patient currently is medically stable to d/c.   Difficult to place patient No       Consultants:  Neurology   Sch Meds:  Scheduled Meds: . apixaban  5 mg Oral BID  .  DULoxetine  20 mg Oral Daily  . finasteride  5 mg Oral Daily  . levothyroxine  25 mcg Oral QAC breakfast  . pregabalin  25 mg Oral QHS  . primidone  250 mg Oral BID  . tamsulosin  0.4 mg Oral BID  . topiramate  50 mg Oral BID   Continuous Infusions: PRN Meds:.acetaminophen **OR** acetaminophen (TYLENOL) oral liquid 160 mg/5 mL **OR** acetaminophen, hydrALAZINE, senna-docusate  Antimicrobials: Anti-infectives (From admission, onward)   None       I have personally reviewed the following labs and images: CBC: Recent Labs  Lab 01/28/21 1045 01/28/21 1048  WBC 6.2  --   NEUTROABS 3.7  --   HGB 11.6* 11.9*  HCT 35.9* 35.0*  MCV 99.7  --   PLT 106*  --    BMP &GFR Recent Labs  Lab 01/28/21 1045 01/28/21 1048  NA 139 141  K 3.9 3.9  CL 103 103  CO2 26  --   GLUCOSE 105* 97  BUN 34* 35*  CREATININE 1.54* 1.40*  CALCIUM 9.0  --    Estimated Creatinine Clearance: 38 mL/min (A) (by C-G formula based on SCr of 1.4 mg/dL (H)). Liver & Pancreas: Recent Labs  Lab 01/28/21 1045  AST 21  ALT 26  ALKPHOS 120  BILITOT 0.7  PROT 6.7  ALBUMIN 3.6   No results for input(s): LIPASE, AMYLASE in the last 168 hours. Recent Labs  Lab 01/28/21 1046  AMMONIA 21   Diabetic: Recent Labs    01/29/21 0237  HGBA1C 5.3   Recent Labs  Lab 01/28/21 1032  GLUCAP 86   Cardiac Enzymes: No results for input(s): CKTOTAL, CKMB, CKMBINDEX, TROPONINI in the last 168 hours. No results for input(s): PROBNP in the last 8760 hours. Coagulation Profile: Recent Labs  Lab 01/28/21 1045  INR 1.1   Thyroid Function Tests: No results for input(s): TSH, T4TOTAL, FREET4, T3FREE, THYROIDAB in the last 72 hours. Lipid Profile: Recent Labs    01/29/21 0237  CHOL 188  HDL 43  LDLCALC 126*  TRIG 96  CHOLHDL 4.4   Anemia Panel: No results for input(s): VITAMINB12, FOLATE, FERRITIN, TIBC, IRON, RETICCTPCT in the last 72 hours. Urine analysis:    Component Value Date/Time    COLORURINE AMBER (A) 01/28/2021 1146   APPEARANCEUR TURBID (A) 01/28/2021 1146   LABSPEC 1.020 01/28/2021 1146   PHURINE 7.0 01/28/2021 1146   GLUCOSEU NEGATIVE 01/28/2021 1146   HGBUR NEGATIVE 01/28/2021 [place] 01/28/2021 [place] 01/28/2021 1146   PROTEINUR NEGATIVE 01/28/2021 1146   UROBILINOGEN 0.2 06/18/2015 0441   NITRITE NEGATIVE 01/28/2021 1146   LEUKOCYTESUR LARGE (A) 01/28/2021 1146   Sepsis Labs: Invalid input(s): PROCALCITONIN, LACTICIDVEN  Microbiology: Recent Results (from the past 240 hour(s))  SARS Coronavirus 2 by  RT PCR (hospital order, performed in [facility] hospital lab) Nasopharyngeal Nasopharyngeal Swab     Status: None   Collection Time: 01/28/21 11:25 AM   Specimen: Nasopharyngeal Swab  Result Value Ref Range Status   SARS Coronavirus 2 NEGATIVE NEGATIVE Final    Comment: (NOTE) SARS-CoV-2 target nucleic acids are NOT DETECTED.  The SARS-CoV-2 RNA is generally detectable in upper and lower respiratory specimens during the acute phase of infection. The lowest concentration of SARS-CoV-2 viral copies this assay can detect is 250 copies / mL. A negative result does not preclude SARS-CoV-2 infection and should not be used as the sole basis for treatment or other patient management decisions.  A negative result may occur with improper specimen collection / handling, submission of specimen other than nasopharyngeal swab, presence of viral mutation(s) within the areas targeted by this assay, and inadequate number of viral copies (<250 copies / mL). A negative result must be combined with clinical observations, patient history, and epidemiological information.  Fact Sheet for Patients:   StrictlyIdeas.no  Fact Sheet for Healthcare Providers: BankingDealers.co.za  This test is not yet approved or  cleared by the [place] FDA and has been authorized for detection  and/or diagnosis of SARS-CoV-2 by FDA under an Emergency Use Authorization (EUA).  This EUA will remain in effect (meaning this test can be used) for the duration of the COVID-19 declaration under Section 564(b)(1) of the Act, 21 U.S.C. section 360bbb-3(b)(1), unless the authorization is terminated or revoked sooner.  Performed at [facility], [place] [address] [place] [employer], [place] [address]     Radiology Studies: No results found.    Taye T. [place]  If 7PM-7AM, please contact night-coverage [email or web address] 01/31/2021, 2:22 PM

## 2021-01-31 NOTE — Progress Notes (Signed)
Occupational Therapy Treatment Patient Details Name: Douglas Walker MRN: [id number] DOB: August 28, 1925 Today's Date: 01/31/2021    History of present illness 87 y.o. male presenting with AMS and difficulty ambulating. CTA (+) occluded left M3/M4. MRI pending. PMHx significant for A-fib, peripheral neuropathy, essential tremor, HTN, CKD stage IIIb, and Hx of falls.   OT comments  OT treatment session with focus on self-care re-education, ADL transfers, and functional communication in prep for ADLs. Patient continues to demonstrate difficulty making needs known 2/2 aphasia and often becomes frustrated by lack of ability to communicate. Patient able to get small phrases out this date but continues to answer "yes/no" questions with decreased accuracy. Patients "yes" often does not mean yes. Patient currently requiring Min guard with short-distance mobility and use of RW, Min guard for toilet transfers, and Min A for LB ADLs with increased time. Recommendation updated to OPOT and 24hr supervision/assist.    Follow Up Recommendations  Outpatient OT;Supervision/Assistance - 24 hour    Equipment Recommendations  None recommended by OT    Recommendations for Other Services      Precautions / Restrictions Precautions Precautions: Fall Precaution Comments: Aphasic, HOH, legally blind in L eye, possible R inattention and/or visual impairment. Says "yes" the most, even when he means no Restrictions Weight Bearing Restrictions: No       Mobility Bed Mobility Overal bed mobility: Needs Assistance Bed Mobility: Supine to Sit     Supine to sit: Supervision     General bed mobility comments: Seated in recliner upon entry.  Transfers Overall transfer level: Needs assistance Equipment used: Rolling walker (2 wheeled);None Transfers: Sit to/from [employer] to Stand: Min guard Stand pivot transfers: Min guard       General transfer comment: Min guard for sit to stand x2  from low recliner and from standard commode with use of grab bar.    Balance Overall balance assessment: Needs assistance Sitting-balance support: No upper extremity supported;Feet supported Sitting balance-Leahy Scale: Good Sitting balance - Comments: Able to doff/don LB clothing in sitting/standing without overt LOB.   Standing balance support: Single extremity supported;Bilateral upper extremity supported Standing balance-Leahy Scale: Fair Standing balance comment: Able to don/doff LB clothing in sitting/standing and manage clothing during toileting tasks without UE support or LOB.                           ADL either performed or assessed with clinical judgement   ADL Overall ADL's : Needs assistance/impaired     Grooming: Supervision/safety;Standing Grooming Details (indicate cue type and reason): Hand hygiene at sink level. Difficulty locating items on sink surface likely 2/2 visual deficits at baseline vs. new R visual deficits.             Lower Body Dressing: Minimal assistance;Sit to/from stand Lower Body Dressing Details (indicate cue type and reason): Min A to thread BLE. Patient able to hike pants over hips in standing without UE support on RW. Increased time needed. Toilet Transfer: Designer, television/film set Details (indicate cue type and reason): Min guard for toilet transfer to commode in bathroom with use of grab bar. Toileting- Water quality scientist and Hygiene: Min guard;Sit to/from stand Toileting - Clothing Manipulation Details (indicate cue type and reason): Min guard for steadying/safety     Functional mobility during ADLs: Min guard;Rolling walker General ADL Comments: Min guard for short-distance mobility in room. Cues to avoid obstacles.     Vision  Perception     Praxis      Cognition Arousal/Alertness: Awake/alert Behavior During Therapy: Flat affect Overall Cognitive Status: Difficult to assess                                  General Comments: pt follows one step commands well this session, repsonds to PT intermittently with one-two word phrases, typically responding with yes/no answers. Difficult to determine if the pt does no respond to all questions due to hearing impairment or receptive aphasia.        Exercises     Shoulder Instructions       General Comments VSS    Pertinent Vitals/ Pain       Pain Assessment: No/denies pain  Home Living                                          Prior Functioning/Environment              Frequency  Min 2X/week        Progress Toward Goals  OT Goals(current goals can now be found in the care plan section)  Progress towards OT goals: Progressing toward goals  Acute Rehab OT Goals Patient Stated Goal: Per son, to get better. OT Goal Formulation: With patient/family Time For Goal Achievement: 02/12/21 Potential to Achieve Goals: Good ADL Goals Pt Will Perform Eating: Independently;sitting Pt Will Perform Grooming: with modified independence;standing Pt Will Perform Upper Body Dressing: sitting;with modified independence Pt Will Perform Lower Body Dressing: sit to/from stand;with supervision Pt Will Transfer to Toilet: ambulating;with supervision Pt Will Perform Toileting - Clothing Manipulation and hygiene: with supervision;sit to/from stand Pt/caregiver will Perform Home Exercise Program: Both right and left upper extremity;With written HEP provided  Plan Discharge plan needs to be updated;Frequency remains appropriate    Co-evaluation                 AM-PAC OT "6 Clicks" Daily Activity     Outcome Measure   Help from another person eating meals?: A Little Help from another person taking care of personal grooming?: A Little Help from another person toileting, which includes using toliet, bedpan, or urinal?: A Little Help from another person bathing (including washing, rinsing, drying)?: A  Little Help from another person to put on and taking off regular upper body clothing?: A Little Help from another person to put on and taking off regular lower body clothing?: A Little 6 Click Score: 18    End of Session Equipment Utilized During Treatment: Gait belt;Rolling walker  OT Visit Diagnosis: Unsteadiness on feet (R26.81);Muscle weakness (generalized) (M62.81)   Activity Tolerance Patient tolerated treatment well   Patient Left in chair;with call bell/phone within reach;with chair alarm set   Nurse Communication          Time: 1638-4536 OT Time Calculation (min): 25 min  Charges: OT General Charges $OT Visit: 1 Visit OT Treatments $Self Care/Home Management : 23-37 mins  Carnelia Oscar H. OTR/L Supplemental OT, Department of rehab services [phone]   Jaimes Eckert R H. 01/31/2021, 11:54 AM

## 2021-01-31 NOTE — Discharge Instructions (Signed)

## 2021-01-31 NOTE — Progress Notes (Signed)
Physical Therapy Treatment Patient Details Name: Douglas Walker MRN: [id number] DOB: 01-Nov-1925 Today's Date: 01/31/2021    History of Present Illness 87 y.o. male presenting with AMS and difficulty ambulating. CTA (+) occluded left M3/M4. MRI pending. PMHx significant for A-fib, peripheral neuropathy, essential tremor, HTN, CKD stage IIIb, and Hx of falls.    PT Comments    Pt tolerates treatment well, although he does seem to become emotional at the end of session with some tears visible. Pt responds with "yes" when asked if he is having a difficult time getting words out, he seems very frustrated by this. Pt does continue to bump into objects in the room but pt has chronic vision deficits and may perform better in a familiar environment. Pt does continue to require some assistance for all transfers and OOB mobility and remains at an increased falls risk due to imbalance, weakness, and vision deficits. PT continues to recommend CIR as the pt was functioning at a modI level prior to admission and demonstrates the potential to return to a modI level. If patient and family elect to discharge home then PT recommends outpatient PT services.  Follow Up Recommendations  CIR     Equipment Recommendations  3in1 (PT)    Recommendations for Other Services       Precautions / Restrictions Precautions Precautions: Fall Precaution Comments: Aphasic, HOH, legally blind in L eye, possible R inattention and/or visual impairment. Says "yes" the most, even when he means no Restrictions Weight Bearing Restrictions: No    Mobility  Bed Mobility Overal bed mobility: Needs Assistance Bed Mobility: Supine to Sit     Supine to sit: Supervision        Transfers Overall transfer level: Needs assistance Equipment used: Rolling walker (2 wheeled);None Transfers: Sit to/from [employer] to Stand: Min assist Stand pivot transfers: Min guard       General transfer comment:  pt requries minA to stand without UE support of RW, minG SPT to recliner with RW  Ambulation/Gait Ambulation/Gait assistance: Min assist Gait Distance (Feet): 200 Feet Assistive device: Rolling walker (2 wheeled) Gait Pattern/deviations: Step-through pattern Gait velocity: reduced Gait velocity interpretation: <1.31 ft/sec, indicative of household ambulator General Gait Details: pt with slowed step-through gait pattern. Pt bumps into 3 objects, 2 on right and one on left, when navigating in tight quarters in the room. When ambulating in hallway pt is able to navigate around most objects present although it is well spaced. Pt ambulates with steady step through gait, slowing with fatigue.   Stairs             Wheelchair Mobility    Modified Rankin (Stroke Patients Only) Modified Rankin (Stroke Patients Only) Pre-Morbid Rankin Score: No symptoms Modified Rankin: Moderately severe disability     Balance Overall balance assessment: Needs assistance Sitting-balance support: No upper extremity supported;Feet supported Sitting balance-Leahy Scale: Good     Standing balance support: Single extremity supported;Bilateral upper extremity supported Standing balance-Leahy Scale: Poor Standing balance comment: reliant on UE support of RW                            Cognition Arousal/Alertness: Awake/alert Behavior During Therapy: Flat affect (does become tearful at end of session) Overall Cognitive Status: Difficult to assess (impaired communication)  General Comments: pt follows one step commands well this session, repsonds to PT intermittently with one-two word phrases, typically responding with yes/no answers. Difficult to determine if the pt does no respond to all questions due to hearing impairment or receptive aphasia.      Exercises      General Comments General comments (skin integrity, edema, etc.): VSS on RA       Pertinent Vitals/Pain Pain Assessment: No/denies pain    Home Living                      Prior Function            PT Goals (current goals can now be found in the care plan section) Acute Rehab PT Goals Patient Stated Goal: to go to rehab before home Progress towards PT goals: Progressing toward goals    Frequency    Min 4X/week      PT Plan Current plan remains appropriate    Co-evaluation              AM-PAC PT "6 Clicks" Mobility   Outcome Measure  Help needed turning from your back to your side while in a flat bed without using bedrails?: A Little Help needed moving from lying on your back to sitting on the side of a flat bed without using bedrails?: A Little Help needed moving to and from a bed to a chair (including a wheelchair)?: A Little Help needed standing up from a chair using your arms (e.g., wheelchair or bedside chair)?: A Little Help needed to walk in hospital room?: A Little Help needed climbing 3-5 steps with a railing? : A Lot 6 Click Score: 17    End of Session Equipment Utilized During Treatment: Gait belt Activity Tolerance: Patient tolerated treatment well Patient left: in chair;with call bell/phone within reach;with chair alarm set Nurse Communication: Mobility status PT Visit Diagnosis: Unsteadiness on feet (R26.81);Difficulty in walking, not elsewhere classified (R26.2);Other symptoms and signs involving the nervous system (R29.898);Muscle weakness (generalized) (M62.81)     Time: 4650-3546 PT Time Calculation (min) (ACUTE ONLY): 31 min  Charges:  $Gait Training: 8-22 mins $Therapeutic Activity: 8-22 mins                     Zenaida Niece, PT, DPT Acute Rehabilitation Pager: [phone]    Zenaida Niece 01/31/2021, 10:36 AM

## 2021-02-01 ENCOUNTER — Other Ambulatory Visit: Payer: Self-pay

## 2021-02-01 ENCOUNTER — Inpatient Hospital Stay (HOSPITAL_COMMUNITY)
Admission: RE | Admit: 2021-02-01 | Discharge: 2021-02-15 | DRG: 057 | Disposition: A | Discharge status: Home Health | Payer: Medicare Other | Source: Intra-hospital | Attending: Physical Medicine and Rehabilitation | Admitting: Physical Medicine and Rehabilitation

## 2021-02-01 ENCOUNTER — Encounter (HOSPITAL_COMMUNITY): Payer: Self-pay | Admitting: Physical Medicine and Rehabilitation

## 2021-02-01 DIAGNOSIS — Z85828 Personal history of other malignant neoplasm of skin: Secondary | ICD-10-CM

## 2021-02-01 DIAGNOSIS — I63512 Cerebral infarction due to unspecified occlusion or stenosis of left middle cerebral artery: Secondary | ICD-10-CM | POA: Diagnosis not present

## 2021-02-01 DIAGNOSIS — K219 Gastro-esophageal reflux disease without esophagitis: Secondary | ICD-10-CM | POA: Diagnosis present

## 2021-02-01 DIAGNOSIS — D649 Anemia, unspecified: Secondary | ICD-10-CM

## 2021-02-01 DIAGNOSIS — H9190 Unspecified hearing loss, unspecified ear: Secondary | ICD-10-CM | POA: Diagnosis present

## 2021-02-01 DIAGNOSIS — R58 Hemorrhage, not elsewhere classified: Secondary | ICD-10-CM | POA: Diagnosis present

## 2021-02-01 DIAGNOSIS — Z821 Family history of blindness and visual loss: Secondary | ICD-10-CM

## 2021-02-01 DIAGNOSIS — I69391 Dysphagia following cerebral infarction: Secondary | ICD-10-CM | POA: Diagnosis not present

## 2021-02-01 DIAGNOSIS — N400 Enlarged prostate without lower urinary tract symptoms: Secondary | ICD-10-CM | POA: Diagnosis present

## 2021-02-01 DIAGNOSIS — I130 Hypertensive heart and chronic kidney disease with heart failure and stage 1 through stage 4 chronic kidney disease, or unspecified chronic kidney disease: Secondary | ICD-10-CM | POA: Diagnosis present

## 2021-02-01 DIAGNOSIS — D696 Thrombocytopenia, unspecified: Secondary | ICD-10-CM

## 2021-02-01 DIAGNOSIS — R0681 Apnea, not elsewhere classified: Secondary | ICD-10-CM | POA: Diagnosis present

## 2021-02-01 DIAGNOSIS — Z950 Presence of cardiac pacemaker: Secondary | ICD-10-CM

## 2021-02-01 DIAGNOSIS — I5032 Chronic diastolic (congestive) heart failure: Secondary | ICD-10-CM | POA: Diagnosis present

## 2021-02-01 DIAGNOSIS — E039 Hypothyroidism, unspecified: Secondary | ICD-10-CM | POA: Diagnosis present

## 2021-02-01 DIAGNOSIS — I2510 Atherosclerotic heart disease of native coronary artery without angina pectoris: Secondary | ICD-10-CM | POA: Diagnosis present

## 2021-02-01 DIAGNOSIS — Z79890 Hormone replacement therapy (postmenopausal): Secondary | ICD-10-CM

## 2021-02-01 DIAGNOSIS — H548 Legal blindness, as defined in USA: Secondary | ICD-10-CM | POA: Diagnosis present

## 2021-02-01 DIAGNOSIS — G250 Essential tremor: Secondary | ICD-10-CM | POA: Diagnosis present

## 2021-02-01 DIAGNOSIS — Z87891 Personal history of nicotine dependence: Secondary | ICD-10-CM

## 2021-02-01 DIAGNOSIS — E785 Hyperlipidemia, unspecified: Secondary | ICD-10-CM | POA: Diagnosis present

## 2021-02-01 DIAGNOSIS — N1832 Chronic kidney disease, stage 3b: Secondary | ICD-10-CM | POA: Diagnosis present

## 2021-02-01 DIAGNOSIS — R040 Epistaxis: Secondary | ICD-10-CM | POA: Diagnosis not present

## 2021-02-01 DIAGNOSIS — I69351 Hemiplegia and hemiparesis following cerebral infarction affecting right dominant side: Principal | ICD-10-CM

## 2021-02-01 DIAGNOSIS — G894 Chronic pain syndrome: Secondary | ICD-10-CM

## 2021-02-01 DIAGNOSIS — M549 Dorsalgia, unspecified: Secondary | ICD-10-CM | POA: Diagnosis present

## 2021-02-01 DIAGNOSIS — Z8619 Personal history of other infectious and parasitic diseases: Secondary | ICD-10-CM

## 2021-02-01 DIAGNOSIS — R21 Rash and other nonspecific skin eruption: Secondary | ICD-10-CM | POA: Diagnosis not present

## 2021-02-01 DIAGNOSIS — Z7901 Long term (current) use of anticoagulants: Secondary | ICD-10-CM

## 2021-02-01 DIAGNOSIS — I69320 Aphasia following cerebral infarction: Secondary | ICD-10-CM

## 2021-02-01 DIAGNOSIS — L299 Pruritus, unspecified: Secondary | ICD-10-CM | POA: Diagnosis not present

## 2021-02-01 DIAGNOSIS — R1312 Dysphagia, oropharyngeal phase: Secondary | ICD-10-CM | POA: Diagnosis present

## 2021-02-01 DIAGNOSIS — Z79899 Other long term (current) drug therapy: Secondary | ICD-10-CM

## 2021-02-01 DIAGNOSIS — I480 Paroxysmal atrial fibrillation: Secondary | ICD-10-CM | POA: Diagnosis present

## 2021-02-01 DIAGNOSIS — I69392 Facial weakness following cerebral infarction: Secondary | ICD-10-CM

## 2021-02-01 DIAGNOSIS — K5900 Constipation, unspecified: Secondary | ICD-10-CM | POA: Diagnosis present

## 2021-02-01 DIAGNOSIS — R0981 Nasal congestion: Secondary | ICD-10-CM | POA: Diagnosis not present

## 2021-02-01 DIAGNOSIS — R7989 Other specified abnormal findings of blood chemistry: Secondary | ICD-10-CM | POA: Diagnosis not present

## 2021-02-01 LAB — CBC
HCT: 33.3 % — ABNORMAL LOW (ref 39.0–52.0)
Hemoglobin: 10.9 g/dL — ABNORMAL LOW (ref 13.0–17.0)
MCH: 32.1 pg (ref 26.0–34.0)
MCHC: 32.7 g/dL (ref 30.0–36.0)
MCV: 97.9 fL (ref 80.0–100.0)
Platelets: 98 10*3/uL — ABNORMAL LOW (ref 150–400)
RBC: 3.4 MIL/uL — ABNORMAL LOW (ref 4.22–5.81)
RDW: 16.1 % — ABNORMAL HIGH (ref 11.5–15.5)
WBC: 7.5 10*3/uL (ref 4.0–10.5)
nRBC: 0 % (ref 0.0–0.2)

## 2021-02-01 LAB — MAGNESIUM: Magnesium: 2.1 mg/dL (ref 1.7–2.4)

## 2021-02-01 LAB — RENAL FUNCTION PANEL
Albumin: 2.9 g/dL — ABNORMAL LOW (ref 3.5–5.0)
Anion gap: 11 (ref 5–15)
BUN: 28 mg/dL — ABNORMAL HIGH (ref 8–23)
CO2: 23 mmol/L (ref 22–32)
Calcium: 8.8 mg/dL — ABNORMAL LOW (ref 8.9–10.3)
Chloride: 109 mmol/L (ref 98–111)
Creatinine, Ser: 1.43 mg/dL — ABNORMAL HIGH (ref 0.61–1.24)
GFR, Estimated: 46 mL/min — ABNORMAL LOW (ref >60)
Glucose, Bld: 99 mg/dL (ref 70–99)
Phosphorus: 3.8 mg/dL (ref 2.5–4.6)
Potassium: 3.7 mmol/L (ref 3.5–5.1)
Sodium: 143 mmol/L (ref 135–145)

## 2021-02-01 LAB — GLUCOSE, CAPILLARY
Glucose-Capillary: 94 mg/dL (ref 70–99)
Glucose-Capillary: 89 mg/dL (ref 70–99)

## 2021-02-01 MED ORDER — TOPIRAMATE 25 MG PO TABS
50.0000 mg | ORAL_TABLET | Freq: Two times a day (BID) | ORAL | Status: DC
  Administered 2021-02-01 – 2021-02-15 (×28): 50 mg via ORAL
  Filled 2021-02-01 (×28): qty 2

## 2021-02-01 MED ORDER — LEVOTHYROXINE SODIUM 25 MCG PO TABS
25.0000 ug | ORAL_TABLET | Freq: Every day | ORAL | Status: DC
  Administered 2021-02-02 – 2021-02-15 (×14): 25 ug via ORAL
  Filled 2021-02-01 (×14): qty 1

## 2021-02-01 MED ORDER — FINASTERIDE 5 MG PO TABS
5.0000 mg | ORAL_TABLET | Freq: Every day | ORAL | Status: DC
  Administered 2021-02-02 – 2021-02-15 (×14): 5 mg via ORAL
  Filled 2021-02-01 (×14): qty 1

## 2021-02-01 MED ORDER — ACETAMINOPHEN 160 MG/5ML PO SOLN
650.0000 mg | ORAL | Status: DC | PRN
Start: 2021-02-01 — End: 2021-02-15

## 2021-02-01 MED ORDER — DULOXETINE HCL 20 MG PO CPEP
20.0000 mg | ORAL_CAPSULE | Freq: Every day | ORAL | Status: DC
  Administered 2021-02-02 – 2021-02-07 (×6): 20 mg via ORAL
  Filled 2021-02-01 (×7): qty 1

## 2021-02-01 MED ORDER — APIXABAN 5 MG PO TABS: 5.0000 mg | ORAL_TABLET | Freq: Two times a day (BID) | ORAL | Status: DC

## 2021-02-01 MED ORDER — SENNOSIDES-DOCUSATE SODIUM 8.6-50 MG PO TABS
1.0000 | ORAL_TABLET | Freq: Every evening | ORAL | Status: DC | PRN
  Administered 2021-02-05: 1 via ORAL
  Filled 2021-02-01: qty 1

## 2021-02-01 MED ORDER — ACETAMINOPHEN 650 MG RE SUPP: 650.0000 mg | RECTAL | Status: DC | PRN

## 2021-02-01 MED ORDER — TRAMADOL HCL 50 MG PO TABS
50.0000 mg | ORAL_TABLET | Freq: Four times a day (QID) | ORAL | Status: DC | PRN
  Administered 2021-02-09 – 2021-02-14 (×8): 50 mg via ORAL
  Filled 2021-02-01 (×8): qty 1

## 2021-02-01 MED ORDER — TAMSULOSIN HCL 0.4 MG PO CAPS
0.4000 mg | ORAL_CAPSULE | Freq: Two times a day (BID) | ORAL | Status: DC
  Administered 2021-02-01 – 2021-02-15 (×28): 0.4 mg via ORAL
  Filled 2021-02-01 (×28): qty 1

## 2021-02-01 MED ORDER — PREGABALIN 25 MG PO CAPS
25.0000 mg | ORAL_CAPSULE | Freq: Every day | ORAL | Status: DC
  Administered 2021-02-01 – 2021-02-14 (×14): 25 mg via ORAL
  Filled 2021-02-01 (×14): qty 1

## 2021-02-01 MED ORDER — PRIMIDONE 250 MG PO TABS
250.0000 mg | ORAL_TABLET | Freq: Two times a day (BID) | ORAL | Status: DC
  Administered 2021-02-01 – 2021-02-15 (×28): 250 mg via ORAL
  Filled 2021-02-01 (×28): qty 1

## 2021-02-01 MED ORDER — APIXABAN 5 MG PO TABS
5.0000 mg | ORAL_TABLET | Freq: Two times a day (BID) | ORAL | Status: DC
  Administered 2021-02-01 – 2021-02-15 (×28): 5 mg via ORAL
  Filled 2021-02-01 (×28): qty 1

## 2021-02-01 MED ORDER — ACETAMINOPHEN 325 MG PO TABS
650.0000 mg | ORAL_TABLET | ORAL | Status: DC | PRN
  Administered 2021-02-01 – 2021-02-15 (×22): 650 mg via ORAL
  Filled 2021-02-01 (×21): qty 2

## 2021-02-01 MED ORDER — FUROSEMIDE 20 MG PO TABS: 40.0000 mg | ORAL_TABLET | Freq: Every day | ORAL | Status: DC

## 2021-02-01 NOTE — Progress Notes (Signed)
Inpatient Rehabilitation  Patient information reviewed and entered into eRehab system by Aerion Bagdasarian M. Leea Rambeau, M.A., CCC/SLP, PPS Coordinator.  Information including medical coding, functional ability and quality indicators will be reviewed and updated through discharge.    

## 2021-02-01 NOTE — Progress Notes (Signed)
Inpatient Rehabilitation Medication Review by a Pharmacist  A complete drug regimen review was completed for this patient to identify any potential clinically significant medication issues.  Clinically significant medication issues were identified: Yes   Type of Medication Issue Identified Description of Issue Urgent (address now) Non-Urgent (address on AM team rounds) Plan Plan Accepted by Provider? (Yes / No / Pending AM Rounds)  Drug Interaction(s) (clinically significant)       Duplicate Therapy       Allergy       No Medication Administration End Date       Incorrect Dose       Additional Drug Therapy Needed       Other  The following medications were listed on pt's discharge med list from [facility] (pt did not receive these meds at [facility], and they were not ordered on admission to CIR): Furosemide MVI with minerals Vitamin B12 tablet Non urgent Pending review by CIR team on AM rounds Pending AM rounds    Name of provider notified for urgent issues identified:  N/A  For non-urgent medication issues to be resolved on team rounds tomorrow morning a [facility] Secure Chat Handoff was sent to:  [place], [place]  Time spent performing this drug regimen review (minutes):  [place], PharmD, BCPS, [facility] Clinical Pharmacist 02/01/2021 4:42 PM

## 2021-02-01 NOTE — TOC Transition Note (Signed)
Transition of Care [facility]) - CM/SW Discharge Note   Patient Details  Name: Douglas Walker MRN: [id number] Date of Birth: 03-Feb-1925  [facility]) CM/SW Contact:  Pollie Friar, RN Phone Number: 02/01/2021, 10:14 AM   Clinical Narrative:    Pt is discharging to CIR today. CM signing off.   Final next level of care: IP Rehab Facility Barriers to Discharge: No Barriers Identified   Patient Goals and CMS Choice        Discharge Placement                       Discharge Plan and Services                                     Social Determinants of Health (SDOH) Interventions     Readmission Risk Interventions Readmission Risk Prevention Plan 07/26/2020  Transportation Screening Complete  PCP or Specialist Appt within 5-7 Days Not Complete  Not Complete comments pending disposition  Home Care Screening Complete  Medication Review (RN CM) Referral to Pharmacy  Some recent data might be hidden

## 2021-02-01 NOTE — Progress Notes (Signed)
  Speech Language Pathology Treatment: Dysphagia;Cognitive-Linquistic  Patient Details Name: Douglas Walker MRN: [id number] DOB: 07-07-1925 Today's Date: 02/01/2021 Time: 9935-7017 SLP Time Calculation (min) (ACUTE ONLY): 17 min  Assessment / Plan / Recommendation Clinical Impression  Assessed pt during breakfast meal of puree and nectar thick liquids, pt tolerating well. Performed oral care, lingual coating noted, improving some post oral care. Pt consumed thin liquid water by cup with continued overt s/sx of suspected reduced airway protection including immediate and delayed coughing. No overt s/sx of aspiration with puree or nectar thick liquids. SLP to continue to follow  Pt remains heavily aphasic, expressive language worsened than receptive language. Pt stimulable for some single word utterances and had one instance of short sentence level expression with multimodal cues; "I do not want grits". Interventions limited by pts legally blind status and HOH.    HPI HPI: Patient is a 87 y.o. mqale with PMH: paroxysmal afib, not on anticoagulants due to frequent falls, peripheral neuropathy, essential tremor, HTN, CKD stage IIIB, who presented to hospital from home (lives with son) with AMS. Son had reported sudden onset confusion, unable to talk , some drooling and wobbly when walking. CT head did not find anything significant, CTA showed occluded left M3/M4 but no intervention due to size of branch. MRI pending patient being more oriented.      SLP Plan  Continue with current plan of care       Recommendations  Diet recommendations: Dysphagia 1 (puree);Nectar-thick liquid Liquids provided via: Cup Medication Administration: Whole meds with puree Supervision: Full supervision/cueing for compensatory strategies;Staff to assist with self feeding Compensations: Small sips/bites;Minimize environmental distractions;Slow rate Postural Changes and/or Swallow Maneuvers: Seated upright 90  degrees;Upright 30-60 min after meal                Oral Care Recommendations: Oral care BID;Staff/trained caregiver to provide oral care Follow up Recommendations: 24 hour supervision/assistance;Inpatient Rehab SLP Visit Diagnosis: Aphasia (R47.01);Cognitive communication deficit (R41.841);Attention and concentration deficit;Dysphagia, oral phase (R13.11);Dysphagia, unspecified (R13.10) Attention and concentration deficit following: Other cerebrovascular disease Plan: Continue with current plan of care       [place], [place]   02/01/2021, 9:44 AM

## 2021-02-01 NOTE — H&P (Incomplete)
Physical Medicine and Rehabilitation Admission H&P    Chief Complaint  Patient presents with  . Stroke Symptoms  . Code Stroke  : HPI: Douglas Walker is a 87 year old right-handed male with history of ESBL 07/2020, essential tremor maintained on primidone, chronic kidney disease stage III, legally blind, diastolic congestive heart failure, CAD, PAF on Coumadin in the past but discontinued due to hematoma on his back, hypertension, hyperlipidemia, TIA, tachycardia syndrome with permanent pacemaker.  Per chart review patient lives with his son 1 level home.  Family assistance as needed.  Uses a rolling walker at baseline.  He attends do his own ADLs independently.  Presented 01/28/2021 with right facial droop and aphasia of acute onset.  Cranial CT scan negative for acute changes.  CT angiogram of head and neck no emergent large vessel occlusion.  Left M3/4 branch occlusion with 25 cc of ischemic range cerebral perfusion.  Echocardiogram with ejection fraction of 50 to 55% no wall motion abnormalities.  Admission chemistries unremarkable except INR 1.1, glucose 105 BUN 34 creatinine 1.54 lactic acid 1.0 ammonia 21, urine drug screen positive barbiturates, SARS coronavirus negative.  Currently maintained on Eliquis for CVA prophylaxis.    Dysphagia #1 nectar thick liquid diet.  Due to patient's decrease in functional mobility he was admitted for a comprehensive rehab program.  Review of Systems  Constitutional: Negative for chills and fever.  HENT: Positive for hearing loss.   Eyes: Negative for double vision.       Legally blind  Respiratory: Negative for cough and shortness of breath.   Cardiovascular: Positive for palpitations.  Gastrointestinal: Positive for constipation. Negative for heartburn, nausea and vomiting.       GERD  Genitourinary: Positive for urgency. Negative for dysuria, flank pain and hematuria.  Musculoskeletal: Positive for back pain.  Neurological: Positive for  tremors, speech change and weakness.  All other systems reviewed and are negative.  Past Medical History:  Diagnosis Date  . Arthritis    shoulders and back  . Cancer ([place])    skin cancers  . Chronic low back pain 12/15/2017  . CKD (chronic kidney disease) stage 3, GFR 30-59 ml/min (HCC)   . Coronary artery disease   . Dysrhythmia     Paroxysmal atrial fibrillation; Tachycardia-bradycardia syndrome  . Essential tremor 02/28/2016  . GERD (gastroesophageal reflux disease)   . Hernia   . History of hiatal hernia   . Hyperlipemia   . Hypertension   . Macular degeneration   . Neuromuscular disorder (HCC)    neuropathy  . Paroxysmal atrial fibrillation ([place]) 10/28/2013  . Presence of permanent cardiac pacemaker 12/04/2018  . Tachycardia-bradycardia syndrome ([place]) 09/13/2014  . TIA (transient ischemic attack)    Past Surgical History:  Procedure Laterality Date  . APPENDECTOMY    . EYE SURGERY    . HERNIA REPAIR    . HERNIA REPAIR    . INSERT / REPLACE / REMOVE PACEMAKER  12/04/2018  . IRRIGATION AND DEBRIDEMENT ABSCESS Right 07/24/2020   Procedure: IRRIGATION AND DEBRIDEMENT HEMATOMA;  Surgeon: Coralie Keens, MD;  Location: [facility];  Service: General;  Laterality: Right;  . MASS EXCISION Right 07/20/2020   Procedure: EXCISION OF RIGHT CHEST WALL MASS;  Surgeon: Stark Klein, MD;  Location: [facility];  Service: General;  Laterality: Right;  . PACEMAKER IMPLANT N/A 12/04/2018   Procedure: PACEMAKER IMPLANT;  Surgeon: Evans Lance, MD;  Location: [facility];  Service: Cardiovascular;  Laterality: N/A;   Family  History  Problem Relation Age of Onset  . GI problems Mother   . Other Sister        PAIN ISSUES  . Hearing loss Sister   . Blindness Sister   . Heart attack Neg Hx   . Stroke Neg Hx    Social History:  reports that he quit smoking about 58 years ago. He has never used smokeless tobacco. He reports that he does not drink alcohol and does not use drugs. Allergies:   Allergies  Allergen Reactions  . Sulfa Antibiotics Other (See Comments)    Weakness, dizziness  . Primidone     Other reaction(s): increased sedation  . Simvastatin     Other reaction(s): arthralgias and fatigue, severe   Medications Prior to Admission  Medication Sig Dispense Refill  . acetaminophen (TYLENOL) 500 MG tablet Take 1,000 mg by mouth 2 (two) times daily as needed for moderate pain.    Marland Kitchen aspirin EC 81 MG tablet Take 81 mg by mouth daily. Swallow whole.    . beta carotene w/minerals (OCUVITE) tablet Take 1 tablet by mouth daily.     . DULoxetine (CYMBALTA) 20 MG capsule Take 20 mg by mouth in the morning, at noon, and at bedtime.    . finasteride (PROSCAR) 5 MG tablet Take 5 mg by mouth daily.    . furosemide (LASIX) 20 MG tablet Take 60 mg by mouth daily.     Marland Kitchen levothyroxine (SYNTHROID) 25 MCG tablet TAKE 1 TABLET (25 MCG TOTAL) BY MOUTH DAILY BEFORE BREAKFAST. 90 tablet 1  . Multiple Vitamin (MULTIVITAMIN WITH MINERALS) TABS tablet Take 1 tablet by mouth daily.    . pregabalin (LYRICA) 25 MG capsule TAKE 1 CAPSULE(25 MG) BY MOUTH AT BEDTIME (Patient taking differently: Take 25 mg by mouth daily.) 90 capsule 1  . primidone (MYSOLINE) 250 MG tablet Take 250 mg by mouth 2 (two) times daily.  11  . senna (SENOKOT) 8.6 MG tablet Take 1 tablet by mouth daily as needed for constipation.    . Tamsulosin HCl (FLOMAX) 0.4 MG CAPS Take 0.4 mg by mouth 2 (two) times daily.     Marland Kitchen topiramate (TOPAMAX) 50 MG tablet Take 1 tablet (50 mg total) by mouth 2 (two) times daily. 180 tablet 3  . vitamin B-12 (CYANOCOBALAMIN) 1000 MCG tablet Take 1,000 mcg by mouth every other day.      Drug Regimen Review Drug regimen was reviewed and remains appropriate with no significant issues identified  Home: Home Living Family/patient expects to be discharged to:: Private residence Living Arrangements: Children Available Help at Discharge: Family,Available 24 hours/day Type of Home: House Home  Access: Level entry Home Layout: One level Bathroom Shower/Tub: Multimedia programmer: Handicapped height Home Equipment: Shower seat,Grab bars - toilet,Walker - 2 wheels,Cane - single point,Bedside commode,Grab bars - tub/shower,Hand held shower head,Wheelchair - manual  Lives With: Son   Functional History: Prior Function Level of Independence: Needs assistance Gait / Transfers Assistance Needed: Uses RW at baseline. ADL's / Homemaking Assistance Needed: Completed ADLs with Mod I. Pt son assists with IADL's i.e. cooking.  Functional Status:  Mobility: Bed Mobility Overal bed mobility: Needs Assistance Bed Mobility: Supine to Sit Supine to sit: Supervision General bed mobility comments: Seated in recliner upon entry. Transfers Overall transfer level: Needs assistance Equipment used: Rolling walker (2 wheeled),None Transfers: Sit to/from [employer] Sit to Stand: Min guard Stand pivot transfers: Min guard General transfer comment: Min guard for sit to stand x2  from low recliner and from standard commode with use of grab bar. Ambulation/Gait Ambulation/Gait assistance: Min assist Gait Distance (Feet): 200 Feet Assistive device: Rolling walker (2 wheeled) Gait Pattern/deviations: Step-through pattern General Gait Details: pt with slowed step-through gait pattern. Pt bumps into 3 objects, 2 on right and one on left, when navigating in tight quarters in the room. When ambulating in hallway pt is able to navigate around most objects present although it is well spaced. Pt ambulates with steady step through gait, slowing with fatigue. Gait velocity: reduced Gait velocity interpretation: <1.31 ft/sec, indicative of household ambulator    ADL: ADL Overall ADL's : Needs assistance/impaired Grooming: Supervision/safety,Standing Grooming Details (indicate cue type and reason): Hand hygiene at sink level. Difficulty locating items on sink surface likely 2/2 visual  deficits at baseline vs. new R visual deficits. Lower Body Dressing: Minimal assistance,Sit to/from stand Lower Body Dressing Details (indicate cue type and reason): Min A to thread BLE. Patient able to hike pants over hips in standing without UE support on RW. Increased time needed. Toilet Transfer: Programmer, systems Details (indicate cue type and reason): Min guard for toilet transfer to commode in bathroom with use of grab bar. Toileting- Water quality scientist and Hygiene: Min guard,Sit to/from stand Toileting - Water quality scientist Details (indicate cue type and reason): Min guard for steadying/safety Functional mobility during ADLs: Min guard,Rolling walker General ADL Comments: Min guard for short-distance mobility in room. Cues to avoid obstacles.  Cognition: Cognition Overall Cognitive Status: Difficult to assess Arousal/Alertness: Awake/alert Orientation Level: Oriented to person,Oriented to place,Disoriented to situation,Disoriented to time Attention: Focused,Sustained Focused Attention: Impaired Focused Attention Impairment: Functional basic,Verbal basic Sustained Attention: Impaired Sustained Attention Impairment: Verbal basic,Functional basic Memory:  (difficult to assess) Awareness: Impaired Problem Solving: Impaired Problem Solving Impairment: Verbal complex,Functional complex Executive Function: Organizing,Sequencing Sequencing: Impaired Organizing: Impaired Safety/Judgment: Impaired Cognition Arousal/Alertness: Awake/alert Behavior During Therapy: Flat affect Overall Cognitive Status: Difficult to assess General Comments: pt follows one step commands well this session, repsonds to PT intermittently with one-two word phrases, typically responding with yes/no answers. Difficult to determine if the pt does no respond to all questions due to hearing impairment or receptive aphasia. Difficult to assess due to: Hard of hearing/deaf,Impaired  communication  Physical Exam: Blood pressure 128/68, pulse 69, temperature 98.2 F (36.8 C), temperature source Oral, resp. rate 17, height 5' 10.5" (1.791 m), weight 92.3 kg, SpO2 95 %. Physical Exam Neurological:     Comments: Patient is alert and hard of hearing.  Makes eye contact with examiner but visually impaired.  Right facial tics/tremor and facial droop.  He is aphasic.     Results for orders placed or performed during the hospital encounter of 01/28/21 (from the past 48 hour(s))  Renal function panel     Status: Abnormal   Collection Time: 02/01/21  3:51 AM  Result Value Ref Range   Sodium 143 135 - 145 mmol/L   Potassium 3.7 3.5 - 5.1 mmol/L   Chloride 109 98 - 111 mmol/L   CO2 23 22 - 32 mmol/L   Glucose, Bld 99 70 - 99 mg/dL    Comment: Glucose reference range applies only to samples taken after fasting for at least 8 hours.   BUN 28 (H) 8 - 23 mg/dL   Creatinine, Ser 1.43 (H) 0.61 - 1.24 mg/dL   Calcium 8.8 (L) 8.9 - 10.3 mg/dL   Phosphorus 3.8 2.5 - 4.6 mg/dL   Albumin 2.9 (L) 3.5 - 5.0 g/dL  GFR, Estimated 46 (L) >60 mL/min    Comment: (NOTE) Calculated using the CKD-EPI Creatinine Equation (2021)    Anion gap 11 5 - 15    Comment: Performed at [facility], [place] [address], [place], [place] [address]  Magnesium     Status: None   Collection Time: 02/01/21  3:51 AM  Result Value Ref Range   Magnesium 2.1 1.7 - 2.4 mg/dL    Comment: Performed at [facility] [address] [place], [place] [address]  CBC     Status: Abnormal   Collection Time: 02/01/21  3:51 AM  Result Value Ref Range   WBC 7.5 4.0 - 10.5 K/uL   RBC 3.40 (L) 4.22 - 5.81 MIL/uL   Hemoglobin 10.9 (L) 13.0 - 17.0 g/dL   HCT 33.3 (L) 39.0 - 52.0 %   MCV 97.9 80.0 - 100.0 fL   MCH 32.1 26.0 - 34.0 pg   MCHC 32.7 30.0 - 36.0 g/dL   RDW 16.1 (H) 11.5 - 15.5 %   Platelets 98 (L) 150 - 400 K/uL    Comment: REPEATED TO VERIFY SPECIMEN CHECKED FOR CLOTS Immature Platelet Fraction  may be clinically indicated, consider ordering this additional test [id number] CONSISTENT WITH PREVIOUS RESULT    nRBC 0.0 0.0 - 0.2 %    Comment: Performed at [facility], [place] [address] [place], [place] [address]   No results found.     Medical Problem List and Plan: 1.  Right hemiparesis and aphasia secondary to left cortical infarct/embolic left MCA  -patient may *** shower  -ELOS/Goals: *** 2.  Antithrombotics: -DVT/anticoagulation: Eliquis  -antiplatelet therapy: N/A 3. Pain Management: Topamax 50 mg twice daily, Lyrica 25 mg nightly, Cymbalta 20 mg daily 4. Mood: Provide emotional support  -antipsychotic agents: N/A 5. Neuropsych: This patient is capable of making decisions on his own behalf. 6. Skin/Wound Care: Routine skin checks 7. Fluids/Electrolytes/Nutrition: Routine in and outs with follow-up chemistries 8.  PAF/pacemaker.  Cardiac rate controlled.  Continue Eliquis 9.  CKD stage III.  Follow-up chemistries 10.  Diastolic congestive heart failure.  Patient on Lasix 60 mg daily prior to admission and has been on hold since admission.  Resume as needed monitor for any signs of fluid overload 11.  Essential tremor.  Mysoline 250 mg twice daily 12.  BPH.  Flomax 0.4 mg twice daily, Proscar 5 mg daily 13.  Legally blind.  Patient independent ADLs prior to admission 14.  Hypothyroidism.  Synthroid 15.  History of ESBL 07/2020.  Contact precautions  ***  Cathlyn Parsons, PA-C 02/01/2021

## 2021-02-01 NOTE — Progress Notes (Signed)
Palliative Medicine RN Note: Consult order noted. It is likely PMT won't have a provider until 2/4. If he moves to CIR and PMT involvement is still desired, please make sure a consult order is placed once he gets to CIR.  Marjie Skiff Ashdon Gillson, RN, BSN, [facility] Palliative Medicine Team 02/01/2021 10:52 AM Office [phone]

## 2021-02-01 NOTE — Progress Notes (Signed)
Inpatient Rehab Admissions Coordinator:    I have a bed available for pt to admit to CIR today. Dr. Cyndia Skeeters in agreement.  Will let pt/family and TOC team know.   Shann Medal, PT, DPT Admissions Coordinator [phone] 02/01/21  10:14 AM

## 2021-02-01 NOTE — Progress Notes (Signed)
PMR Admission Coordinator Pre-Admission Assessment   Patient: Douglas Walker is an 87 y.o., male MRN: [id number] DOB: [birth year] Height: 5' 10.5" (179.1 cm) Weight: 92.3 kg                                                                                                                                                  Insurance Information HMO:     PPO:      PCP:      IPA:      80/20:      OTHER:  PRIMARY: Medicare A and B      Policy#: [phone]      Subscriber: pt CM Name:       Phone#:      Fax#:  Pre-Cert#: verified Health and safety inspector:  Benefits:  Phone #:      Name:  Eff. Date: A and B 07/30/92     Deduct: $1556      Out of Pocket Max: n/a      Life Max: n/a  CIR: 100%      SNF: 20 full days Outpatient: 80%     Co-Pay: 20% Home Health: 100%      Co-Pay:  DME: 80%     Co-Pay: 20% Providers: pt choice SECONDARY: [employer]      Policy#: [id number]      Phone#: [phone]   Financial Counselor:       Phone#:    The "Data Collection Information Summary" for patients in Inpatient Rehabilitation Facilities with attached "Privacy Act Statement-Health Care Records" was provided and verbally reviewed with: Patient   Emergency Contact Information         Contact Information     Name Relation Home Work [place] Son [phone]   [phone]    Abdifatah, [place] Daughter     [phone]       Current Medical History  Patient Admitting Diagnosis: CVA   History of Present Illness: Mancel L. Rosendale is a 87 year old right-handed male with history of ESBL infection 07/2020, essential tremor maintained on primidone, chronic kidney disease stage III, legally blind, diastolic congestive heart failure, CAD, PAF on Coumadin in the past but discontinued due to hematoma on his back, hypertension, hyperlipidemia, TIA, tachycardia syndrome with permanent pacemaker. Presented 01/28/2021 with right facial droop and aphasia of acute onset.  Cranial CT scan negative for acute changes.  CT  angiogram of head and neck no emergent large vessel occlusion.  Left M3/4 branch occlusion with 25 cc of ischemic range cerebral perfusion.  Echocardiogram with ejection fraction of 50 to 55% no wall motion abnormalities.  Admission chemistries unremarkable except INR 1.1, glucose 105 BUN 34 creatinine 1.54 lactic acid 1.0 ammonia 21, urine drug screen positive barbiturates, SARS coronavirus negative.  Currently maintained on Eliquis for CVA prophylaxis.  Dysphagia #1 nectar thick liquid diet.  Due to patient's decrease in functional mobility he was recommended for a comprehensive rehab program.   Complete NIHSS TOTAL: 12 Glasgow Coma Scale Score: 14   Past Medical History      Past Medical History:  Diagnosis Date  . Arthritis      shoulders and back  . Cancer ([place])      skin cancers  . Chronic low back pain 12/15/2017  . CKD (chronic kidney disease) stage 3, GFR 30-59 ml/min (HCC)    . Coronary artery disease    . Dysrhythmia       Paroxysmal atrial fibrillation; Tachycardia-bradycardia syndrome  . Essential tremor 02/28/2016  . GERD (gastroesophageal reflux disease)    . Hernia    . History of hiatal hernia    . Hyperlipemia    . Hypertension    . Macular degeneration    . Neuromuscular disorder (HCC)      neuropathy  . Paroxysmal atrial fibrillation ([place]) 10/28/2013  . Presence of permanent cardiac pacemaker 12/04/2018  . Tachycardia-bradycardia syndrome ([place]) 09/13/2014  . TIA (transient ischemic attack)        Family History  family history includes Blindness in his sister; GI problems in his mother; Hearing loss in his sister; Other in his sister.   Prior Rehab/Hospitalizations:  Has the patient had prior rehab or hospitalizations prior to admission? Yes   Has the patient had major surgery during 100 days prior to admission? No   Current Medications    Current Facility-Administered Medications:  .  acetaminophen (TYLENOL) tablet 650 mg, 650 mg, Oral, Q4H PRN, 650 mg  at 01/31/21 2020 **OR** acetaminophen (TYLENOL) 160 MG/5ML solution 650 mg, 650 mg, Per Tube, Q4H PRN **OR** acetaminophen (TYLENOL) suppository 650 mg, 650 mg, Rectal, Q4H PRN, Wynetta Fines T, MD .  apixaban (ELIQUIS) tablet 5 mg, 5 mg, Oral, BID, Cyndia Skeeters, Taye T, MD, 5 mg at 01/31/21 2209 .  DULoxetine (CYMBALTA) DR capsule 20 mg, 20 mg, Oral, Daily, Wynetta Fines T, MD, 20 mg at 01/31/21 0858 .  finasteride (PROSCAR) tablet 5 mg, 5 mg, Oral, Daily, Wynetta Fines T, MD, 5 mg at 01/31/21 0858 .  hydrALAZINE (APRESOLINE) injection 5 mg, 5 mg, Intravenous, Q4H PRN, Wynetta Fines T, MD .  levothyroxine (SYNTHROID) tablet 25 mcg, 25 mcg, Oral, QAC breakfast, Wynetta Fines T, MD, 25 mcg at 02/01/21 [phone] .  pregabalin (LYRICA) capsule 25 mg, 25 mg, Oral, QHS, Wynetta Fines T, MD, 25 mg at 01/31/21 2209 .  primidone (MYSOLINE) tablet 250 mg, 250 mg, Oral, BID, Wynetta Fines T, MD, 250 mg at 01/31/21 2208 .  senna-docusate (Senokot-S) tablet 1 tablet, 1 tablet, Oral, QHS PRN, Wynetta Fines T, MD .  tamsulosin [facility]) capsule 0.4 mg, 0.4 mg, Oral, BID, Wynetta Fines T, MD, 0.4 mg at 01/31/21 2208 .  topiramate (TOPAMAX) tablet 50 mg, 50 mg, Oral, BID, Wynetta Fines T, MD, 50 mg at 01/31/21 2209   Patients Current Diet:     Diet Order                      DIET - DYS 1 Room service appropriate? Yes with Assist; Fluid consistency: Nectar Thick  Diet effective now                      Precautions / Restrictions Precautions Precautions: Fall Precaution Comments: Aphasic, HOH, legally blind in L eye, possible R inattention and/or visual impairment.  Says "yes" the most, even when he means no Restrictions Weight Bearing Restrictions: No    Has the patient had 2 or more falls or a fall with injury in the past year? 1 fall with minor abrasions in the last year   Prior Activity Level Household: household mobility with RW, mod I with all mobility and ADLs, assist for pillbox but then able to take pills correctly (per  son)   Prior Functional Level Prior Function Level of Independence: Needs assistance Gait / Transfers Assistance Needed: Uses RW at baseline. ADL's / Homemaking Assistance Needed: Completed ADLs with Mod I. Pt son assists with IADL's i.e. cooking.   Self Care: Did the patient need help bathing, dressing, using the toilet or eating?  Independent   Indoor Mobility: Did the patient need assistance with walking from room to room (with or without device)? Independent   Stairs: Did the patient need assistance with internal or external stairs (with or without device)? Needed some help   Functional Cognition: Did the patient need help planning regular tasks such as shopping or remembering to take medications? Needed some help   Home Assistive Devices / Equipment Home Equipment: Shower seat,Grab bars - toilet,Walker - 2 wheels,Cane - single point,Bedside commode,Grab bars - tub/shower,Hand held shower head,Wheelchair - manual   Prior Device Use: Indicate devices/aids used by the patient prior to current illness, exacerbation or injury? Walker   Current Functional Level Cognition   Arousal/Alertness: Awake/alert Overall Cognitive Status: Difficult to assess Difficult to assess due to: Hard of hearing/deaf,Impaired communication Orientation Level: Oriented to person,Oriented to place,Disoriented to situation,Disoriented to time General Comments: pt follows one step commands well this session, repsonds to PT intermittently with one-two word phrases, typically responding with yes/no answers. Difficult to determine if the pt does no respond to all questions due to hearing impairment or receptive aphasia. Attention: Focused,Sustained Focused Attention: Impaired Focused Attention Impairment: Functional basic,Verbal basic Sustained Attention: Impaired Sustained Attention Impairment: Verbal basic,Functional basic Memory:  (difficult to assess) Awareness: Impaired Problem Solving: Impaired Problem  Solving Impairment: Verbal complex,Functional complex Executive Function: Organizing,Sequencing Sequencing: Impaired Organizing: Impaired Safety/Judgment: Impaired    Extremity Assessment (includes Sensation/Coordination)   Upper Extremity Assessment: RUE deficits/detail RUE Deficits / Details: RUE intention tremors at baseline.  Lower Extremity Assessment: Generalized weakness,Difficult to assess due to impaired cognition (able to intermittently follow familiar rote commands such as "stand up" but could not follow cues for MMT)     ADLs   Overall ADL's : Needs assistance/impaired Grooming: Supervision/safety,Standing Grooming Details (indicate cue type and reason): Hand hygiene at sink level. Difficulty locating items on sink surface likely 2/2 visual deficits at baseline vs. new R visual deficits. Lower Body Dressing: Minimal assistance,Sit to/from stand Lower Body Dressing Details (indicate cue type and reason): Min A to thread BLE. Patient able to hike pants over hips in standing without UE support on RW. Increased time needed. Toilet Transfer: Programmer, systems Details (indicate cue type and reason): Min guard for toilet transfer to commode in bathroom with use of grab bar. Toileting- Water quality scientist and Hygiene: Min guard,Sit to/from stand Toileting - Water quality scientist Details (indicate cue type and reason): Min guard for steadying/safety Functional mobility during ADLs: Min guard,Rolling walker General ADL Comments: Min guard for short-distance mobility in room. Cues to avoid obstacles.     Mobility   Overal bed mobility: Needs Assistance Bed Mobility: Supine to Sit Supine to sit: Supervision General bed mobility comments: Seated in recliner upon entry.  Transfers   Overall transfer level: Needs assistance Equipment used: Rolling walker (2 wheeled),None Transfers: Sit to/from [employer] Sit to Stand: Min guard Stand pivot transfers:  Min guard General transfer comment: Min guard for sit to stand x2 from low recliner and from standard commode with use of grab bar.     Ambulation / Gait / Stairs / Wheelchair Mobility   Ambulation/Gait Ambulation/Gait assistance: Herbalist (Feet): 200 Feet Assistive device: Rolling walker (2 wheeled) Gait Pattern/deviations: Step-through pattern General Gait Details: pt with slowed step-through gait pattern. Pt bumps into 3 objects, 2 on right and one on left, when navigating in tight quarters in the room. When ambulating in hallway pt is able to navigate around most objects present although it is well spaced. Pt ambulates with steady step through gait, slowing with fatigue. Gait velocity: reduced Gait velocity interpretation: <1.31 ft/sec, indicative of household ambulator     Posture / Balance Dynamic Sitting Balance Sitting balance - Comments: Able to doff/don LB clothing in sitting/standing without overt LOB. Balance Overall balance assessment: Needs assistance Sitting-balance support: No upper extremity supported,Feet supported Sitting balance-Leahy Scale: Good Sitting balance - Comments: Able to doff/don LB clothing in sitting/standing without overt LOB. Standing balance support: Single extremity supported,Bilateral upper extremity supported Standing balance-Leahy Scale: Fair Standing balance comment: Able to don/doff LB clothing in sitting/standing and manage clothing during toileting tasks without UE support or LOB.     Special needs/care consideration Special service needs visual deficits and Designated visitor Axton Dutch (son)        Previous Home Environment (from acute therapy documentation) Living Arrangements: Children  Lives With: Son Available Help at Discharge: Family,Available 24 hours/day Type of Home: House Home Layout: One level Home Access: Level entry Bathroom Shower/Tub: Multimedia programmer: Handicapped height   Discharge  Living Setting Plans for Discharge Living Setting: Lives with (comment) (children) Type of Home at Discharge: House Discharge [place]: One level (sunken den, but pt does not need to access) Discharge Home Access: Level entry Discharge Bathroom Shower/Tub: Walk-in shower Discharge Bathroom Toilet: Handicapped height Discharge Bathroom Accessibility: Yes How Accessible: Accessible via walker Does the patient have any problems obtaining your medications?: No   Social/Family/Support Systems Anticipated Caregiver: Son [name] is primary caregiver (also has daughter in law, grand children, and daughter available to assist) Anticipated Caregiver's Contact Information: [name]: [phone] Ability/Limitations of Caregiver: n/a Caregiver Availability: 24/7 Discharge Plan Discussed with Primary Caregiver: Yes Is Caregiver In Agreement with Plan?: Yes Does Caregiver/Family have Issues with Lodging/Transportation while Pt is in Rehab?: No     Goals Patient/Family Goal for Rehab: PT/OT supervision, SLP supervision to min assist Expected length of stay: 8-12 days Pt/Family Agrees to Admission and willing to participate: Yes Program Orientation Provided & Reviewed with Pt/Caregiver Including Roles  & Responsibilities: Yes     Decrease burden of Care through IP rehab admission: n/a     Possible need for SNF placement upon discharge: No.  Pt with supervision level goals and excellent family support.    Patient Condition: This patient's medical and functional status has changed since the consult dated: 01/30/2021 in which the Rehabilitation Physician determined and documented that the patient's condition is appropriate for intensive rehabilitative care in an inpatient rehabilitation facility. See "History of Present Illness" (above) for medical update. Functional changes are: pt min assist with mobility and ADLs. Patient's medical and functional status update has been discussed with the Rehabilitation  physician and patient remains appropriate for  inpatient rehabilitation. Will admit to inpatient rehab today.   Preadmission Screen Completed By:  Michel Santee, PT, DPT 02/01/2021 10:20 AM ______________________________________________________________________   Discussed status with Dr. Dagoberto Ligas on 02/01/21 at 10:30 AM  and received approval for admission today.   Admission Coordinator:  Michel Santee, PT, DPT time 10:31 AM Sudie Grumbling 02/01/21              Cosigned by: Courtney Heys, MD at 02/01/2021 11:26 AM

## 2021-02-01 NOTE — H&P (Signed)
Physical Medicine and Rehabilitation Admission H&P    No chief complaint on file. : HPI: Douglas Walker is a 87 year old right-handed male with history of ESBL 07/2020, essential tremor maintained on primidone, chronic kidney disease stage III, legally blind, diastolic congestive heart failure, CAD, PAF on Coumadin in the past but discontinued due to hematoma on his back, hypertension, hyperlipidemia, TIA, tachycardia syndrome with permanent pacemaker.  Per chart review patient lives with his son 1 level home.  Family assistance as needed.  Uses a rolling walker at baseline.  He attends do his own ADLs independently.  Presented 01/28/2021 with right facial droop and aphasia of acute onset.  Cranial CT scan negative for acute changes.  CT angiogram of head and neck no emergent large vessel occlusion.  Left M3/4 branch occlusion with 25 cc of ischemic range cerebral perfusion.  Echocardiogram with ejection fraction of 50 to 55% no wall motion abnormalities.  Admission chemistries unremarkable except INR 1.1, glucose 105 BUN 34 creatinine 1.54 lactic acid 1.0 ammonia 21, urine drug screen positive barbiturates, SARS coronavirus negative.  Currently maintained on Eliquis for CVA prophylaxis.    Dysphagia #1 nectar thick liquid diet.  Due to patient's decrease in functional mobility he was admitted for a comprehensive rehab program.  Pt reports "his sides hurt"- but due to expressive aphasia, was unable to explain why or how.  LBM yesterday - 'cleaned out". - has male [place] currently.   Review of Systems  Constitutional: Negative for chills and fever.  HENT: Positive for hearing loss.   Eyes: Negative for double vision.       Legally blind  Respiratory: Negative for cough and shortness of breath.   Cardiovascular: Positive for palpitations.  Gastrointestinal: Positive for constipation. Negative for heartburn, nausea and vomiting.       GERD  Genitourinary: Positive for urgency. Negative for  dysuria, flank pain and hematuria.  Musculoskeletal: Positive for back pain.  Neurological: Positive for tremors, speech change and weakness.  All other systems reviewed and are negative.  Past Medical History:  Diagnosis Date  . Arthritis    shoulders and back  . Cancer ([place])    skin cancers  . Chronic low back pain 12/15/2017  . CKD (chronic kidney disease) stage 3, GFR 30-59 ml/min (HCC)   . Coronary artery disease   . Dysrhythmia     Paroxysmal atrial fibrillation; Tachycardia-bradycardia syndrome  . Essential tremor 02/28/2016  . GERD (gastroesophageal reflux disease)   . Hernia   . History of hiatal hernia   . Hyperlipemia   . Hypertension   . Macular degeneration   . Neuromuscular disorder (HCC)    neuropathy  . Paroxysmal atrial fibrillation ([place]) 10/28/2013  . Presence of permanent cardiac pacemaker 12/04/2018  . Tachycardia-bradycardia syndrome ([place]) 09/13/2014  . TIA (transient ischemic attack)    Past Surgical History:  Procedure Laterality Date  . APPENDECTOMY    . EYE SURGERY    . HERNIA REPAIR    . HERNIA REPAIR    . INSERT / REPLACE / REMOVE PACEMAKER  12/04/2018  . IRRIGATION AND DEBRIDEMENT ABSCESS Right 07/24/2020   Procedure: IRRIGATION AND DEBRIDEMENT HEMATOMA;  Surgeon: Coralie Keens, MD;  Location: [facility];  Service: General;  Laterality: Right;  . MASS EXCISION Right 07/20/2020   Procedure: EXCISION OF RIGHT CHEST WALL MASS;  Surgeon: Stark Klein, MD;  Location: [facility];  Service: General;  Laterality: Right;  . PACEMAKER IMPLANT N/A 12/04/2018   Procedure: PACEMAKER IMPLANT;  Surgeon: Evans Lance, MD;  Location: [facility];  Service: Cardiovascular;  Laterality: N/A;   Family History  Problem Relation Age of Onset  . GI problems Mother   . Other Sister        PAIN ISSUES  . Hearing loss Sister   . Blindness Sister   . Heart attack Neg Hx   . Stroke Neg Hx    Social History:  reports that he quit smoking about 58 years ago. He has  never used smokeless tobacco. He reports that he does not drink alcohol and does not use drugs. Allergies:  Allergies  Allergen Reactions  . Sulfa Antibiotics Other (See Comments)    Weakness, dizziness  . Primidone     Other reaction(s): increased sedation  . Simvastatin     Other reaction(s): arthralgias and fatigue, severe   Medications Prior to Admission  Medication Sig Dispense Refill  . acetaminophen (TYLENOL) 500 MG tablet Take 1,000 mg by mouth 2 (two) times daily as needed for moderate pain.    Marland Kitchen apixaban (ELIQUIS) 5 MG TABS tablet Take 1 tablet (5 mg total) by mouth 2 (two) times daily. 60 tablet   . DULoxetine (CYMBALTA) 20 MG capsule Take 20 mg by mouth in the morning, at noon, and at bedtime.    . finasteride (PROSCAR) 5 MG tablet Take 5 mg by mouth daily.    . furosemide (LASIX) 20 MG tablet Take 2 tablets (40 mg total) by mouth daily. 30 tablet   . levothyroxine (SYNTHROID) 25 MCG tablet TAKE 1 TABLET (25 MCG TOTAL) BY MOUTH DAILY BEFORE BREAKFAST. 90 tablet 1  . Multiple Vitamin (MULTIVITAMIN WITH MINERALS) TABS tablet Take 1 tablet by mouth daily.    . pregabalin (LYRICA) 25 MG capsule TAKE 1 CAPSULE(25 MG) BY MOUTH AT BEDTIME (Patient taking differently: Take 25 mg by mouth daily.) 90 capsule 1  . primidone (MYSOLINE) 250 MG tablet Take 250 mg by mouth 2 (two) times daily.  11  . senna (SENOKOT) 8.6 MG tablet Take 1 tablet by mouth daily as needed for constipation.    . Tamsulosin HCl (FLOMAX) 0.4 MG CAPS Take 0.4 mg by mouth 2 (two) times daily.     Marland Kitchen topiramate (TOPAMAX) 50 MG tablet Take 1 tablet (50 mg total) by mouth 2 (two) times daily. 180 tablet 3  . vitamin B-12 (CYANOCOBALAMIN) 1000 MCG tablet Take 1,000 mcg by mouth every other day.      Drug Regimen Review Drug regimen was reviewed and remains appropriate with no significant issues identified  Home: Home Living Family/patient expects to be discharged to:: Private residence Living Arrangements:  Children   Functional History:    Functional Status:  Mobility:          ADL:    Cognition:      Physical Exam: Blood pressure 130/67, pulse 69, temperature 98.4 F (36.9 C), resp. rate 17, height 5\' 9"  (1.753 m), weight 90.8 kg, SpO2 97 %. Physical Exam Vitals and nursing note reviewed.  Constitutional:      Comments: Pt initially asleep- having apneic episodes where stopped breathing x2- sats dropped from 95+% to 84% before I was able to get him to wake up- Edentulous mostly, supine in bed, NAD  HENT:     Head: Normocephalic and atraumatic.     Comments: Edentulous, wasn't able to smile, but facial appearance equal; tongue midline Constant jaw tremors/trembling when asleep or awake    Right Ear: External ear normal.  Left Ear: External ear normal.     Nose: Nose normal. No congestion.     Mouth/Throat:     Mouth: Mucous membranes are dry.     Pharynx: Oropharynx is clear. No oropharyngeal exudate.  Eyes:     General:        Right eye: No discharge.        Left eye: No discharge.     Extraocular Movements: Extraocular movements intact.     Comments: Blind- legally  Cardiovascular:     Comments: Irregular rhythm- rate controlled in 70s-80s- no JVD Pulmonary:     Comments: CTA B/L- no W/R/R- good air movement Except with apneic episodes as above- sats dropped to 84% before woke up with my help Abdominal:     Comments: Soft, NT, ND, (+)BS -hypoactive  Genitourinary:    Comments: Has male purewick in place Musculoskeletal:     Cervical back: Normal range of motion and neck supple.     Comments: At least 3/5 in RUE and RLE was moving- lifted off bed some- couldn't get him to move much, so sleepy- also RUE tremors WITH movement LUE- strong- likely 5-/5 LLE- good DF and PF 5-/5- but couldn't get to test higher due to sleepiness  Skin:    Comments: ecchymoses in arms B/L L forearm IV- looks OK  Neurological:     Comments: Patient is alert and hard of hearing.   Makes eye contact with examiner but visually impaired.  Right facial tics/tremor and facial droop.  He is aphasic.  Moderately to severely aphasic- only said a few words Cannot assess sensation due to aphasia and sedation Hard of hearing- says 3 phrases total- unable to say more to express himself  Psychiatric:     Comments: Flat, sleepy     Results for orders placed or performed during the hospital encounter of 02/01/21 (from the past 48 hour(s))  Glucose, capillary     Status: None   Collection Time: 02/01/21  5:26 PM  Result Value Ref Range   Glucose-Capillary 94 70 - 99 mg/dL    Comment: Glucose reference range applies only to samples taken after fasting for at least 8 hours.   No results found.     Medical Problem List and Plan: 1.  Right hemiparesis and aphasia secondary to left cortical infarct/embolic left MCA  -patient may  shower  -ELOS/Goals:78-12 days- min A to supervision 2.  Antithrombotics: -DVT/anticoagulation: Eliquis  -antiplatelet therapy: N/A 3. Pain Management: Topamax 50 mg twice daily, Lyrica 25 mg nightly, Cymbalta 20 mg daily- c/o sides hurting- will add tramadol prn for severe pain 4. Mood: Provide emotional support  -antipsychotic agents: N/A 5. Neuropsych: This patient is capable of making decisions on his own behalf. 6. Skin/Wound Care: Routine skin checks 7. Fluids/Electrolytes/Nutrition: Routine in and outs with follow-up chemistries 8.  PAF/pacemaker.  Cardiac rate controlled.  Continue Eliquis 9.  CKD stage IIIB  Follow-up chemistries 10.  Diastolic congestive heart failure.  Patient on Lasix 60 mg daily prior to admission and has been on hold since admission.  Resume as needed monitor for any signs of fluid overload 11.  Essential tremor.  Mysoline 250 mg twice daily- mainly RUE and jaw 12.  BPH.  Flomax 0.4 mg twice daily, Proscar 5 mg daily 13.  Legally blind.  Patient independent ADLs prior to admission 14.  Hypothyroidism.  Synthroid 15.   History of ESBL 07/2020.  Contact precautions for life per hospital policy.   Lavon Paganini  [place]- PA-C 02/01/2021   I have personally performed a face to face diagnostic evaluation of this patient and formulated the key components of the plan.  Additionally, I have personally reviewed laboratory data, imaging studies, as well as relevant notes and concur with the physician assistant's documentation above.  Courtney Heys, MD 02/01/2021

## 2021-02-01 NOTE — Discharge Summary (Addendum)
Physician Discharge Summary  Douglas Walker [id number] DOB: 23-May-1925 DOA: 01/28/2021  PCP: Josetta Huddle, MD  Admit date: 01/28/2021 Discharge date: 02/01/2021  Admitted From: Home Disposition: [facility] inpatient rehab  Recommendations for Outpatient Follow-up:  1. Follow ups as below. 2. Please obtain CBC/BMP/Mag in 1 week 3. Please follow up on the following pending results: None   Discharge Condition: Stable CODE STATUS: DNR/DNI   Hospital Course: 87 year old male with PMH of paroxysmal A. fib not on AC, , peripheral neuropathy, SSS s/p PPM, CKD-3B and essential tremor brought to ED due to altered mental status and aphasia, and found to have acute left MCA branch CVA with CTA and CT perfusion study suggesting M3-4 occlusion.  Neurology recommended recommended Eliquis.  SLP recommended dysphagia 1 diet.  Discharged to [facility] per therapy recommendation.  Ambulatory referral to neurology ordered.  Needs follow-up in 4 to 6 weeks.  See individual problem list below for more on hospital course.  Discharge Diagnoses:  Acute left MCA CVA in patient with known history of PAF not on anticoagulation: Likely ischemic but at risk for embolic CVA as well. CTA and CT perfusion study suggesting M3-4 occlusion.  Presented with acute encephalopathy and aphasia.  TTE without significant finding.  LDL 126.  A1c 5.3.  Still with significant aphasia and dysphagia. -Started on Eliquis by neurology after risk-benefit discussion -Continue therapy at [facility]. -On dysphagia 1 diet.  Chronic diastolic CHF: No mention of diastolic dysfunction on his TTE in 2016.  Diastolic function could not be evaluated on current TTE.  Appears to be on p.o. Lasix 60 mg daily at home from PCP which has been on hold since admission.  Does not appear fluid overloaded.  No cardiopulmonary symptoms. -Continue home Lasix at 40 mg daily -Monitor fluid status.  Check BMP and Mg in 1 week  Dysphagia due to CVA-SLP recommended  dysphagia 1 diet. -Continue SLP  Paroxysmal A. Fib: Not on medication for this.  -Started on Eliquis.  History of SSS s/p PPM -Noted.  Essential hypertension: BP within acceptable range. -Continue Lasix as above  CKD-3B: Stable. -Check BMP in 1 week  Normocytic anemia: Likely anemia of chronic disease.  H&H stable. Recent Labs    07/24/20 1335 07/26/20 0501 07/26/20 1551 07/27/20 0127 07/27/20 1459 07/28/20 0101 08/24/20 1123 01/28/21 1045 01/28/21 1048 02/01/21 0351  HGB 9.6* 8.0* 8.9* 8.2* 9.2* 8.4* 9.8* 11.6* 11.9* 10.9*  -Recheck CBC in 1 week  Chronic thrombocytopenia: At baseline. -Recheck CBC in 1 week  Essential tremor: Stable. -Continue home primidone and Lyrica  Hypothyroidism: Stable. -Continue home Synthroid  Debility -Continue therapy at CIR.  BPH: Stable -Continue home Flomax and Proscar.   Body mass index is 28.78 kg/m.            Discharge Exam: Vitals:   02/01/21 0337 02/01/21 0723  BP: 128/68 (!) 141/67  Pulse: 69 70  Resp: 17 19  Temp: 98.2 F (36.8 C) 98.3 F (36.8 C)  SpO2: 95% 95%    GENERAL: No apparent distress.  Nontoxic. HEENT: MMM.  Vision and hearing grossly intact.  NECK: Supple.  No apparent JVD.  RESP:  No IWOB.  Fair aeration bilaterally. CVS:  RRR. Heart sounds normal.  ABD/GI/GU: Bowel sounds present. Soft. Non tender.  MSK/EXT:  Moves extremities. No apparent deformity. No edema.  SKIN: no apparent skin lesion or wound NEURO: Awake and alert.  Oriented x2.  Significant expressive aphasia.  Essential tremor.  Monitor 3+/5 on on the right  and 4/5 on the left in both upper and lower extremities. PSYCH: Calm. Normal affect.  Discharge Instructions  Discharge Instructions    Ambulatory referral to Neurology   Complete by: As directed    An appointment is requested in approximately: 4weeks   Diet - low sodium heart healthy   Complete by: As directed    Dysphagia-1 diet   Increase activity  slowly   Complete by: As directed      Allergies as of 02/01/2021      Reactions   Sulfa Antibiotics Other (See Comments)   Weakness, dizziness   Primidone    Other reaction(s): increased sedation   Simvastatin    Other reaction(s): arthralgias and fatigue, severe      Medication List    STOP taking these medications   aspirin EC 81 MG tablet   beta carotene w/minerals tablet     TAKE these medications   acetaminophen 500 MG tablet Commonly known as: TYLENOL Take 1,000 mg by mouth 2 (two) times daily as needed for moderate pain.   apixaban 5 MG Tabs tablet Commonly known as: ELIQUIS Take 1 tablet (5 mg total) by mouth 2 (two) times daily.   DULoxetine 20 MG capsule Commonly known as: CYMBALTA Take 20 mg by mouth in the morning, at noon, and at bedtime.   finasteride 5 MG tablet Commonly known as: PROSCAR Take 5 mg by mouth daily.   furosemide 20 MG tablet Commonly known as: LASIX Take 2 tablets (40 mg total) by mouth daily. What changed: how much to take   levothyroxine 25 MCG tablet Commonly known as: SYNTHROID TAKE 1 TABLET (25 MCG TOTAL) BY MOUTH DAILY BEFORE BREAKFAST.   multivitamin with minerals Tabs tablet Take 1 tablet by mouth daily.   pregabalin 25 MG capsule Commonly known as: LYRICA TAKE 1 CAPSULE(25 MG) BY MOUTH AT BEDTIME What changed:   how much to take  how to take this  when to take this  additional instructions   primidone 250 MG tablet Commonly known as: MYSOLINE Take 250 mg by mouth 2 (two) times daily.   senna 8.6 MG tablet Commonly known as: SENOKOT Take 1 tablet by mouth daily as needed for constipation.   tamsulosin 0.4 MG Caps capsule Commonly known as: FLOMAX Take 0.4 mg by mouth 2 (two) times daily.   topiramate 50 MG tablet Commonly known as: TOPAMAX Take 1 tablet (50 mg total) by mouth 2 (two) times daily.   vitamin B-12 1000 MCG tablet Commonly known as: CYANOCOBALAMIN Take 1,000 mcg by mouth every other  day.       Consultations:  Neurology  Procedures/Studies:  2D Echo on 01/28/2021 1. Left ventricular ejection fraction, by estimation, is 50 to 55%. The  left ventricle has low normal function. The left ventricle has no regional  wall motion abnormalities. Left ventricular diastolic function could not  be evaluated.  2. Right ventricular systolic function is mildly reduced. The right  ventricular size is normal. There is moderately elevated pulmonary artery  systolic pressure.  3. Left atrial size was moderately dilated.  4. Right atrial size was moderately dilated.  5. The mitral valve is normal in structure. No evidence of mitral valve  regurgitation.  6. The aortic valve is tricuspid. Aortic valve regurgitation is mild.  Mild aortic valve sclerosis is present, with no evidence of aortic valve  stenosis.  7. Aortic dilatation noted. There is moderate dilatation of the ascending  aorta, measuring 47 mm.  8. The  inferior vena cava is dilated in size with <50% respiratory  variability, suggesting right atrial pressure of 15 mmHg.    CT Code Stroke CTA Head W/WO contrast  Result Date: 01/28/2021 CLINICAL DATA:  Facial droop.  Acute stroke suspected EXAM: CT ANGIOGRAPHY HEAD AND NECK CT PERFUSION BRAIN TECHNIQUE: Multidetector CT imaging of the head and neck was performed using the standard protocol during bolus administration of intravenous contrast. Multiplanar CT image reconstructions and MIPs were obtained to evaluate the vascular anatomy. Carotid stenosis measurements (when applicable) are obtained utilizing NASCET criteria, using the distal internal carotid diameter as the denominator. Multiphase CT imaging of the brain was performed following IV bolus contrast injection. Subsequent parametric perfusion maps were calculated using RAPID software. CONTRAST:  Dose is currently not known COMPARISON:  Head CT from earlier today FINDINGS: CTA NECK FINDINGS Aortic arch:  Atheromatous plaque with 3 vessel branching. Right carotid system: Mild for age atheromatous plaque mainly at the bifurcation where there is mild proximal ICA narrowing mainly due to wasting rather than luminal plaque buildup. No ulceration. Left carotid system: Atheromatous wall thickening of the common carotid and proximal ICA, overall mild for age. No stenosis or ulceration. Vertebral arteries: No proximal subclavian stenosis. Mild left vertebral artery dominance. Scattered atheromatous calcifications of the vertebral arteries without flow limiting stenosis. No beading or dissection. Skeleton: No acute finding Other neck: No evidence of inflammation or mass. Upper chest: Negative Review of the MIP images confirms the above findings CTA HEAD FINDINGS Anterior circulation: Atheromatous calcification of the bilateral carotid siphon. No large vessel occlusion, beading, or proximal stenosis. A left M3/4 branch occlusion is noted, as marked on sagittal reformats. Posterior circulation: Mild left vertebral artery dominance. Vertebral and basilar arteries are smooth and widely patent. Fetal type right PCA. No branch occlusion, beading, or aneurysm Venous sinuses: Diffusely patent Anatomic variants: As above Review of the MIP images confirms the above findings CT Brain Perfusion Findings: ASPECTS: 10 CBF (<30%) Volume: 11mL Perfusion (Tmax>6.0s) volume: 33mL Mismatch Volume: 57mL IMPRESSION: 1. Left M3/4 branch occlusion with 25 cc of ischemic range cerebral perfusion. 2. No emergent large vessel occlusion. 3. Atherosclerosis without proximal flow limiting stenosis. Electronically Signed   By: [place] Fantasia M.D.   On: 01/28/2021 11:25   DG Chest 1 View  Result Date: 01/28/2021 CLINICAL DATA:  Code stroke. EXAM: CHEST  1 VIEW COMPARISON:  Chest x-ray dated 08/24/2020 FINDINGS: Stable cardiomegaly. Overall cardiomediastinal silhouette is stable in size and configuration. LEFT chest wall pacemaker/ICD apparatus appears  stable in position. Lungs are clear.  No pleural effusion or pneumothorax is seen. IMPRESSION: No active cardiopulmonary disease. No evidence of pneumonia or pulmonary edema. Stable cardiomegaly. Electronically Signed   By: Franki Cabot M.D.   On: 01/28/2021 11:46   CT Code Stroke CTA Neck W/WO contrast  Result Date: 01/28/2021 CLINICAL DATA:  Facial droop.  Acute stroke suspected EXAM: CT ANGIOGRAPHY HEAD AND NECK CT PERFUSION BRAIN TECHNIQUE: Multidetector CT imaging of the head and neck was performed using the standard protocol during bolus administration of intravenous contrast. Multiplanar CT image reconstructions and MIPs were obtained to evaluate the vascular anatomy. Carotid stenosis measurements (when applicable) are obtained utilizing NASCET criteria, using the distal internal carotid diameter as the denominator. Multiphase CT imaging of the brain was performed following IV bolus contrast injection. Subsequent parametric perfusion maps were calculated using RAPID software. CONTRAST:  Dose is currently not known COMPARISON:  Head CT from earlier today FINDINGS: CTA NECK FINDINGS Aortic arch:  Atheromatous plaque with 3 vessel branching. Right carotid system: Mild for age atheromatous plaque mainly at the bifurcation where there is mild proximal ICA narrowing mainly due to wasting rather than luminal plaque buildup. No ulceration. Left carotid system: Atheromatous wall thickening of the common carotid and proximal ICA, overall mild for age. No stenosis or ulceration. Vertebral arteries: No proximal subclavian stenosis. Mild left vertebral artery dominance. Scattered atheromatous calcifications of the vertebral arteries without flow limiting stenosis. No beading or dissection. Skeleton: No acute finding Other neck: No evidence of inflammation or mass. Upper chest: Negative Review of the MIP images confirms the above findings CTA HEAD FINDINGS Anterior circulation: Atheromatous calcification of the  bilateral carotid siphon. No large vessel occlusion, beading, or proximal stenosis. A left M3/4 branch occlusion is noted, as marked on sagittal reformats. Posterior circulation: Mild left vertebral artery dominance. Vertebral and basilar arteries are smooth and widely patent. Fetal type right PCA. No branch occlusion, beading, or aneurysm Venous sinuses: Diffusely patent Anatomic variants: As above Review of the MIP images confirms the above findings CT Brain Perfusion Findings: ASPECTS: 10 CBF (<30%) Volume: 76mL Perfusion (Tmax>6.0s) volume: 12mL Mismatch Volume: 68mL IMPRESSION: 1. Left M3/4 branch occlusion with 25 cc of ischemic range cerebral perfusion. 2. No emergent large vessel occlusion. 3. Atherosclerosis without proximal flow limiting stenosis. Electronically Signed   By: [place] Fantasia M.D.   On: 01/28/2021 11:25   CT Code Stroke Cerebral Perfusion with contrast  Result Date: 01/28/2021 CLINICAL DATA:  Facial droop.  Acute stroke suspected EXAM: CT ANGIOGRAPHY HEAD AND NECK CT PERFUSION BRAIN TECHNIQUE: Multidetector CT imaging of the head and neck was performed using the standard protocol during bolus administration of intravenous contrast. Multiplanar CT image reconstructions and MIPs were obtained to evaluate the vascular anatomy. Carotid stenosis measurements (when applicable) are obtained utilizing NASCET criteria, using the distal internal carotid diameter as the denominator. Multiphase CT imaging of the brain was performed following IV bolus contrast injection. Subsequent parametric perfusion maps were calculated using RAPID software. CONTRAST:  Dose is currently not known COMPARISON:  Head CT from earlier today FINDINGS: CTA NECK FINDINGS Aortic arch: Atheromatous plaque with 3 vessel branching. Right carotid system: Mild for age atheromatous plaque mainly at the bifurcation where there is mild proximal ICA narrowing mainly due to wasting rather than luminal plaque buildup. No ulceration.  Left carotid system: Atheromatous wall thickening of the common carotid and proximal ICA, overall mild for age. No stenosis or ulceration. Vertebral arteries: No proximal subclavian stenosis. Mild left vertebral artery dominance. Scattered atheromatous calcifications of the vertebral arteries without flow limiting stenosis. No beading or dissection. Skeleton: No acute finding Other neck: No evidence of inflammation or mass. Upper chest: Negative Review of the MIP images confirms the above findings CTA HEAD FINDINGS Anterior circulation: Atheromatous calcification of the bilateral carotid siphon. No large vessel occlusion, beading, or proximal stenosis. A left M3/4 branch occlusion is noted, as marked on sagittal reformats. Posterior circulation: Mild left vertebral artery dominance. Vertebral and basilar arteries are smooth and widely patent. Fetal type right PCA. No branch occlusion, beading, or aneurysm Venous sinuses: Diffusely patent Anatomic variants: As above Review of the MIP images confirms the above findings CT Brain Perfusion Findings: ASPECTS: 10 CBF (<30%) Volume: 92mL Perfusion (Tmax>6.0s) volume: 15mL Mismatch Volume: 37mL IMPRESSION: 1. Left M3/4 branch occlusion with 25 cc of ischemic range cerebral perfusion. 2. No emergent large vessel occlusion. 3. Atherosclerosis without proximal flow limiting stenosis. Electronically Signed   By: Angelica Chessman  Watts M.D.   On: 01/28/2021 11:25   ECHOCARDIOGRAM COMPLETE  Result Date: 01/28/2021    ECHOCARDIOGRAM REPORT   Patient Name:   EMILLIO NGO Date of Exam: 01/28/2021 Medical Rec #:  [id number]          Height:       70.5 in Accession #:    [id number]         Weight:       203.5 lb Date of Birth:  [birth year]          BSA:          2.114 m Patient Age:    48 years           BP:           176/81 mmHg Patient Gender: M                  HR:           70 bpm. Exam Location:  Inpatient Procedure: 2D Echo Indications:    TIA 435.9  History:        Patient has  prior history of Echocardiogram examinations, most                 recent 05/19/2015. Chronic kidney disease, Arrythmias:Atrial                 Fibrillation; Risk Factors:Hypertension.  Sonographer:    Johny Chess Referring Phys: [id number] PING T ZHANG  Sonographer Comments: Image acquisition challenging due to uncooperative patient. IMPRESSIONS  1. Left ventricular ejection fraction, by estimation, is 50 to 55%. The left ventricle has low normal function. The left ventricle has no regional wall motion abnormalities. Left ventricular diastolic function could not be evaluated.  2. Right ventricular systolic function is mildly reduced. The right ventricular size is normal. There is moderately elevated pulmonary artery systolic pressure.  3. Left atrial size was moderately dilated.  4. Right atrial size was moderately dilated.  5. The mitral valve is normal in structure. No evidence of mitral valve regurgitation.  6. The aortic valve is tricuspid. Aortic valve regurgitation is mild. Mild aortic valve sclerosis is present, with no evidence of aortic valve stenosis.  7. Aortic dilatation noted. There is moderate dilatation of the ascending aorta, measuring 47 mm.  8. The inferior vena cava is dilated in size with <50% respiratory variability, suggesting right atrial pressure of 15 mmHg. Comparison(s): Prior images unable to be directly viewed, comparison made by report only. Ascending aorta dilation has worsened since 2019 (but measurement is similar to CT angio report from may 2021). FINDINGS  Left Ventricle: Left ventricular ejection fraction, by estimation, is 50 to 55%. The left ventricle has low normal function. The left ventricle has no regional wall motion abnormalities. The left ventricular internal cavity size was normal in size. There is no left ventricular hypertrophy. Abnormal (paradoxical) septal motion, consistent with RV pacemaker. Left ventricular diastolic function could not be evaluated due to atrial  fibrillation. Left ventricular diastolic function could not be evaluated. Right Ventricle: The right ventricular size is normal. No increase in right ventricular wall thickness. Right ventricular systolic function is mildly reduced. There is moderately elevated pulmonary artery systolic pressure. The tricuspid regurgitant velocity is 2.76 m/s, and with an assumed right atrial pressure of 15 mmHg, the estimated right ventricular systolic pressure is 54.0 mmHg. Left Atrium: Left atrial size was moderately dilated. Right Atrium: Right atrial size was moderately dilated. Pericardium: There is no evidence of  pericardial effusion. Mitral Valve: The mitral valve is normal in structure. No evidence of mitral valve regurgitation. Tricuspid Valve: The tricuspid valve is normal in structure. Tricuspid valve regurgitation is mild. Aortic Valve: The aortic valve is tricuspid. Aortic valve regurgitation is mild. Mild aortic valve sclerosis is present, with no evidence of aortic valve stenosis. Pulmonic Valve: The pulmonic valve was not well visualized. Pulmonic valve regurgitation is not visualized. Aorta: Aortic dilatation noted. There is moderate dilatation of the ascending aorta, measuring 47 mm. Venous: The inferior vena cava is dilated in size with less than 50% respiratory variability, suggesting right atrial pressure of 15 mmHg. IAS/Shunts: No atrial level shunt detected by color flow Doppler. Additional Comments: A pacer wire is visualized.  LEFT VENTRICLE PLAX 2D LVIDd:         4.70 cm Diastology LVIDs:         3.40 cm LV e' medial:    5.87 cm/s LV PW:         1.30 cm LV E/e' medial:  21.0 LV IVS:        1.00 cm LV e' lateral:   10.40 cm/s                        LV E/e' lateral: 11.8  RIGHT VENTRICLE            IVC RV S prime:     8.38 cm/s  IVC diam: 2.40 cm TAPSE (M-mode): 1.1 cm LEFT ATRIUM             Index       RIGHT ATRIUM           Index LA diam:        4.20 cm 1.99 cm/m  RA Area:     16.00 cm LA Vol (A2C):    63.8 ml 30.18 ml/m RA Volume:   41.30 ml  19.54 ml/m LA Vol (A4C):   67.7 ml 32.03 ml/m LA Biplane Vol: 65.7 ml 31.08 ml/m  AORTIC VALVE LVOT Vmax:   97.90 cm/s LVOT Vmean:  62.500 cm/s LVOT VTI:    0.228 m  AORTA Ao Asc diam: 4.70 cm MITRAL VALVE                TRICUSPID VALVE MV Area (PHT): 3.60 cm     TR Peak grad:   30.5 mmHg MV Decel Time: 211 msec     TR Vmax:        276.00 cm/s MV E velocity: 123.00 cm/s MV A velocity: 33.10 cm/s   SHUNTS MV E/A ratio:  3.72         Systemic VTI: 0.23 m Dani Gobble Croitoru MD Electronically signed by Sanda Klein MD Signature Date/Time: 01/28/2021/2:10:39 PM    Final    CUP PACEART REMOTE DEVICE CHECK  Result Date: 01/06/2021 Scheduled remote reviewed. Histogram stacked at 70bpm, consistent with previous.  Normal device function.  R. Powers, LPN, CVRS Next remote 91 days.  CT HEAD CODE STROKE WO CONTRAST  Result Date: 01/28/2021 CLINICAL DATA:  Code stroke. Facial droop and altered mental status, side not specified EXAM: CT HEAD WITHOUT CONTRAST TECHNIQUE: Contiguous axial images were obtained from the base of the skull through the vertex without intravenous contrast. COMPARISON:  07/18/2015 FINDINGS: Brain: No evidence of acute infarction, hemorrhage, hydrocephalus, extra-axial collection or mass lesion/mass effect. Generalized atrophy. Moderate chronic small vessel ischemia in the cerebral white matter. Vascular: No definite hyperdense vessel. A left M2 branch appears  prominent in density on coronal reformats but not on the other projections. Skull: Normal. Negative for fracture or focal lesion. Sinuses/Orbits: No acute finding. Other: These results were communicated to Dr. Curly Shores at 11:00 amon 1/30/2022by text page via the [facility] messaging system. ASPECTS [facility] Stroke Program Early CT Score) - Ganglionic level infarction (caudate, lentiform nuclei, internal capsule, insula, M1-M3 cortex): 7 - Supraganglionic infarction (M4-M6 cortex): 3 Total score (0-10 with 10  being normal): 10 IMPRESSION: 1. Aging brain without acute finding. 2. ASPECTS is 10 Electronically Signed   By: [place] Fantasia M.D.   On: 01/28/2021 11:02        The results of significant diagnostics from this hospitalization (including imaging, microbiology, ancillary and laboratory) are listed below for reference.     Microbiology: Recent Results (from the past 240 hour(s))  SARS Coronavirus 2 by RT PCR (hospital order, performed in [facility] hospital lab) Nasopharyngeal Nasopharyngeal Swab     Status: None   Collection Time: 01/28/21 11:25 AM   Specimen: Nasopharyngeal Swab  Result Value Ref Range Status   SARS Coronavirus 2 NEGATIVE NEGATIVE Final    Comment: (NOTE) SARS-CoV-2 target nucleic acids are NOT DETECTED.  The SARS-CoV-2 RNA is generally detectable in upper and lower respiratory specimens during the acute phase of infection. The lowest concentration of SARS-CoV-2 viral copies this assay can detect is 250 copies / mL. A negative result does not preclude SARS-CoV-2 infection and should not be used as the sole basis for treatment or other patient management decisions.  A negative result may occur with improper specimen collection / handling, submission of specimen other than nasopharyngeal swab, presence of viral mutation(s) within the areas targeted by this assay, and inadequate number of viral copies (<250 copies / mL). A negative result must be combined with clinical observations, patient history, and epidemiological information.  Fact Sheet for Patients:   StrictlyIdeas.no  Fact Sheet for Healthcare Providers: BankingDealers.co.za  This test is not yet approved or  cleared by the [place] FDA and has been authorized for detection and/or diagnosis of SARS-CoV-2 by FDA under an Emergency Use Authorization (EUA).  This EUA will remain in effect (meaning this test can be used) for the duration of the COVID-19  declaration under Section 564(b)(1) of the Act, 21 U.S.C. section 360bbb-3(b)(1), unless the authorization is terminated or revoked sooner.  Performed at [facility], [place] [address], [place], [place] [address]      Labs:  CBC: Recent Labs  Lab 01/28/21 1045 01/28/21 1048 02/01/21 0351  WBC 6.2  --  7.5  NEUTROABS 3.7  --   --   HGB 11.6* 11.9* 10.9*  HCT 35.9* 35.0* 33.3*  MCV 99.7  --  97.9  PLT 106*  --  98*   BMP &GFR Recent Labs  Lab 01/28/21 1045 01/28/21 1048 02/01/21 0351  NA 139 141 143  K 3.9 3.9 3.7  CL 103 103 109  CO2 26  --  23  GLUCOSE 105* 97 99  BUN 34* 35* 28*  CREATININE 1.54* 1.40* 1.43*  CALCIUM 9.0  --  8.8*  MG  --   --  2.1  PHOS  --   --  3.8   Estimated Creatinine Clearance: 37.2 mL/min (A) (by C-G formula based on SCr of 1.43 mg/dL (H)). Liver & Pancreas: Recent Labs  Lab 01/28/21 1045 02/01/21 0351  AST 21  --   ALT 26  --   ALKPHOS 120  --   BILITOT  0.7  --   PROT 6.7  --   ALBUMIN 3.6 2.9*   No results for input(s): LIPASE, AMYLASE in the last 168 hours. Recent Labs  Lab 01/28/21 1046  AMMONIA 21   Diabetic: No results for input(s): HGBA1C in the last 72 hours. Recent Labs  Lab 01/28/21 1032  GLUCAP 86   Cardiac Enzymes: No results for input(s): CKTOTAL, CKMB, CKMBINDEX, TROPONINI in the last 168 hours. No results for input(s): PROBNP in the last 8760 hours. Coagulation Profile: Recent Labs  Lab 01/28/21 1045  INR 1.1   Thyroid Function Tests: No results for input(s): TSH, T4TOTAL, FREET4, T3FREE, THYROIDAB in the last 72 hours. Lipid Profile: No results for input(s): CHOL, HDL, LDLCALC, TRIG, CHOLHDL, LDLDIRECT in the last 72 hours. Anemia Panel: No results for input(s): VITAMINB12, FOLATE, FERRITIN, TIBC, IRON, RETICCTPCT in the last 72 hours. Urine analysis:    Component Value Date/Time   COLORURINE AMBER (A) 01/28/2021 1146   APPEARANCEUR TURBID (A) 01/28/2021 1146   LABSPEC 1.020 01/28/2021  1146   PHURINE 7.0 01/28/2021 1146   GLUCOSEU NEGATIVE 01/28/2021 1146   HGBUR NEGATIVE 01/28/2021 [place] 01/28/2021 [place] 01/28/2021 1146   PROTEINUR NEGATIVE 01/28/2021 1146   UROBILINOGEN 0.2 06/18/2015 0441   NITRITE NEGATIVE 01/28/2021 1146   LEUKOCYTESUR LARGE (A) 01/28/2021 1146   Sepsis Labs: Invalid input(s): PROCALCITONIN, LACTICIDVEN   Time coordinating discharge: 35  minutes  SIGNED:  Mercy Riding, MD  [facility] 02/01/2021, 10:37 AM  If 7PM-7AM, please contact night-coverage [email or web address]

## 2021-02-01 NOTE — PMR Pre-admission (Signed)
PMR Admission Coordinator Pre-Admission Assessment  Patient: Douglas Walker is an 87 y.o., male MRN: [id number] DOB: 10-Jan-1925 Height: 5' 10.5" (179.1 cm) Weight: 92.3 kg              Insurance Information HMO:     PPO:      PCP:      IPA:      80/20:      OTHER:  PRIMARY: Medicare A and B      Policy#: [id number]      Subscriber: pt CM Name:       Phone#:      Fax#:  Pre-Cert#: verified Civil engineer, contracting:  Benefits:  Phone #:      Name:  Eff. Date: A and B 07/30/92     Deduct: $1556      Out of Pocket Max: n/a      Life Max: n/a  CIR: 100%      SNF: 20 full days Outpatient: 80%     Co-Pay: 20% Home Health: 100%      Co-Pay:  DME: 80%     Co-Pay: 20% Providers: pt choice SECONDARY: A[employer]      Policy#: [id number]      Phone#: [phone]  Financial Counselor:       Phone#:   The "Data Collection Information Summary" for patients in Inpatient Rehabilitation Facilities with attached "Privacy Act [place] Records" was provided and verbally reviewed with: Patient  Emergency Contact Information Contact Information    Name Relation Home Work Douglas Walker Son [phone]  [phone]   [name], [name] Daughter   [phone]     Current Medical History  Patient Admitting Diagnosis: CVA  History of Present Illness: Douglas Walker is a 87 year old right-handed male with history of ESBL infection 07/2020, essential tremor maintained on primidone, chronic kidney disease stage III, legally blind, diastolic congestive heart failure, CAD, PAF on Coumadin in the past but discontinued due to hematoma on his back, hypertension, hyperlipidemia, TIA, tachycardia syndrome with permanent pacemaker. Presented 01/28/2021 with right facial droop and aphasia of acute onset.  Cranial CT scan negative for acute changes.  CT angiogram of head and neck no emergent large vessel occlusion.  Left M3/4 branch occlusion with 25 cc of ischemic range cerebral perfusion.   Echocardiogram with ejection fraction of 50 to 55% no wall motion abnormalities.  Admission chemistries unremarkable except INR 1.1, glucose 105 BUN 34 creatinine 1.54 lactic acid 1.0 ammonia 21, urine drug screen positive barbiturates, SARS coronavirus negative.  Currently maintained on Eliquis for CVA prophylaxis.    Dysphagia #1 nectar thick liquid diet.  Due to patient's decrease in functional mobility he was recommended for a comprehensive rehab program.  Complete NIHSS TOTAL: 12 Glasgow Coma Scale Score: 14  Past Medical History  Past Medical History:  Diagnosis Date  . Arthritis    shoulders and back  . Cancer ([place])    skin cancers  . Chronic low back pain 12/15/2017  . CKD (chronic kidney disease) stage 3, GFR 30-59 ml/min (HCC)   . Coronary artery disease   . Dysrhythmia     Paroxysmal atrial fibrillation; Tachycardia-bradycardia syndrome  . Essential tremor 02/28/2016  . GERD (gastroesophageal reflux disease)   . Hernia   . History of hiatal hernia   . Hyperlipemia   . Hypertension   . Macular degeneration   . Neuromuscular disorder (HCC)    neuropathy  . Paroxysmal atrial fibrillation ([place]) 10/28/2013  .  Presence of permanent cardiac pacemaker 12/04/2018  . Tachycardia-bradycardia syndrome ([place]) 09/13/2014  . TIA (transient ischemic attack)     Family History  family history includes Blindness in his sister; GI problems in his mother; Hearing loss in his sister; Other in his sister.  Prior Rehab/Hospitalizations:  Has the patient had prior rehab or hospitalizations prior to admission? Yes  Has the patient had major surgery during 100 days prior to admission? No  Current Medications   Current Facility-Administered Medications:  .  acetaminophen (TYLENOL) tablet 650 mg, 650 mg, Oral, Q4H PRN, 650 mg at 01/31/21 2020 **OR** acetaminophen (TYLENOL) 160 MG/5ML solution 650 mg, 650 mg, Per Tube, Q4H PRN **OR** acetaminophen (TYLENOL) suppository 650 mg, 650 mg, Rectal,  Q4H PRN, Wynetta Fines T, MD .  apixaban (ELIQUIS) tablet 5 mg, 5 mg, Oral, BID, Cyndia Skeeters, Taye T, MD, 5 mg at 01/31/21 2209 .  DULoxetine (CYMBALTA) DR capsule 20 mg, 20 mg, Oral, Daily, Wynetta Fines T, MD, 20 mg at 01/31/21 0858 .  finasteride (PROSCAR) tablet 5 mg, 5 mg, Oral, Daily, Wynetta Fines T, MD, 5 mg at 01/31/21 0858 .  hydrALAZINE (APRESOLINE) injection 5 mg, 5 mg, Intravenous, Q4H PRN, Wynetta Fines T, MD .  levothyroxine (SYNTHROID) tablet 25 mcg, 25 mcg, Oral, QAC breakfast, Wynetta Fines T, MD, 25 mcg at 02/01/21 [phone] .  pregabalin (LYRICA) capsule 25 mg, 25 mg, Oral, QHS, Wynetta Fines T, MD, 25 mg at 01/31/21 2209 .  primidone (MYSOLINE) tablet 250 mg, 250 mg, Oral, BID, Wynetta Fines T, MD, 250 mg at 01/31/21 2208 .  senna-docusate (Senokot-S) tablet 1 tablet, 1 tablet, Oral, QHS PRN, Wynetta Fines T, MD .  tamsulosin [facility]) capsule 0.4 mg, 0.4 mg, Oral, BID, Wynetta Fines T, MD, 0.4 mg at 01/31/21 2208 .  topiramate (TOPAMAX) tablet 50 mg, 50 mg, Oral, BID, Wynetta Fines T, MD, 50 mg at 01/31/21 2209  Patients Current Diet:  Diet Order            DIET - DYS 1 Room service appropriate? Yes with Assist; Fluid consistency: Nectar Thick  Diet effective now                 Precautions / Restrictions Precautions Precautions: Fall Precaution Comments: Aphasic, HOH, legally blind in L eye, possible R inattention and/or visual impairment. Says "yes" the most, even when he means no Restrictions Weight Bearing Restrictions: No   Has the patient had 2 or more falls or a fall with injury in the past year? 1 fall with minor abrasions in the last year  Prior Activity Level Household: household mobility with RW, mod I with all mobility and ADLs, assist for pillbox but then able to take pills correctly (per son)  Prior Functional Level Prior Function Level of Independence: Needs assistance Gait / Transfers Assistance Needed: Uses RW at baseline. ADL's / Homemaking Assistance Needed: Completed  ADLs with Mod I. Pt son assists with IADL's i.e. cooking.  Self Care: Did the patient need help bathing, dressing, using the toilet or eating?  Independent  Indoor Mobility: Did the patient need assistance with walking from room to room (with or without device)? Independent  Stairs: Did the patient need assistance with internal or external stairs (with or without device)? Needed some help  Functional Cognition: Did the patient need help planning regular tasks such as shopping or remembering to take medications? Needed some help  Home Assistive Devices / Equipment Home Equipment: Shower seat,Grab bars - toilet,Walker - 2 wheels,Cane -  single point,Bedside commode,Grab bars - tub/shower,Hand held shower head,Wheelchair - manual  Prior Device Use: Indicate devices/aids used by the patient prior to current illness, exacerbation or injury? Walker  Current Functional Level Cognition  Arousal/Alertness: Awake/alert Overall Cognitive Status: Difficult to assess Difficult to assess due to: Hard of hearing/deaf,Impaired communication Orientation Level: Oriented to person,Oriented to place,Disoriented to situation,Disoriented to time General Comments: pt follows one step commands well this session, repsonds to PT intermittently with one-two word phrases, typically responding with yes/no answers. Difficult to determine if the pt does no respond to all questions due to hearing impairment or receptive aphasia. Attention: Focused,Sustained Focused Attention: Impaired Focused Attention Impairment: Functional basic,Verbal basic Sustained Attention: Impaired Sustained Attention Impairment: Verbal basic,Functional basic Memory:  (difficult to assess) Awareness: Impaired Problem Solving: Impaired Problem Solving Impairment: Verbal complex,Functional complex Executive Function: Organizing,Sequencing Sequencing: Impaired Organizing: Impaired Safety/Judgment: Impaired    Extremity Assessment (includes  Sensation/Coordination)  Upper Extremity Assessment: RUE deficits/detail RUE Deficits / Details: RUE intention tremors at baseline.  Lower Extremity Assessment: Generalized weakness,Difficult to assess due to impaired cognition (able to intermittently follow familiar rote commands such as "stand up" but could not follow cues for MMT)    ADLs  Overall ADL's : Needs assistance/impaired Grooming: Supervision/safety,Standing Grooming Details (indicate cue type and reason): Hand hygiene at sink level. Difficulty locating items on sink surface likely 2/2 visual deficits at baseline vs. new R visual deficits. Lower Body Dressing: Minimal assistance,Sit to/from stand Lower Body Dressing Details (indicate cue type and reason): Min A to thread BLE. Patient able to hike pants over hips in standing without UE support on RW. Increased time needed. Toilet Transfer: Programmer, systems Details (indicate cue type and reason): Min guard for toilet transfer to commode in bathroom with use of grab bar. Toileting- Water quality scientist and Hygiene: Min guard,Sit to/from stand Toileting - Water quality scientist Details (indicate cue type and reason): Min guard for steadying/safety Functional mobility during ADLs: Min guard,Rolling walker General ADL Comments: Min guard for short-distance mobility in room. Cues to avoid obstacles.    Mobility  Overal bed mobility: Needs Assistance Bed Mobility: Supine to Sit Supine to sit: Supervision General bed mobility comments: Seated in recliner upon entry.    Transfers  Overall transfer level: Needs assistance Equipment used: Rolling walker (2 wheeled),None Transfers: Sit to/from [employer] Sit to Stand: Min guard Stand pivot transfers: Min guard General transfer comment: Min guard for sit to stand x2 from low recliner and from standard commode with use of grab bar.    Ambulation / Gait / Stairs / Wheelchair Mobility   Ambulation/Gait Ambulation/Gait assistance: Herbalist (Feet): 200 Feet Assistive device: Rolling walker (2 wheeled) Gait Pattern/deviations: Step-through pattern General Gait Details: pt with slowed step-through gait pattern. Pt bumps into 3 objects, 2 on right and one on left, when navigating in tight quarters in the room. When ambulating in hallway pt is able to navigate around most objects present although it is well spaced. Pt ambulates with steady step through gait, slowing with fatigue. Gait velocity: reduced Gait velocity interpretation: <1.31 ft/sec, indicative of household ambulator    Posture / Balance Dynamic Sitting Balance Sitting balance - Comments: Able to doff/don LB clothing in sitting/standing without overt LOB. Balance Overall balance assessment: Needs assistance Sitting-balance support: No upper extremity supported,Feet supported Sitting balance-Leahy Scale: Good Sitting balance - Comments: Able to doff/don LB clothing in sitting/standing without overt LOB. Standing balance support: Single extremity supported,Bilateral upper extremity supported Standing  balance-Leahy Scale: Fair Standing balance comment: Able to don/doff LB clothing in sitting/standing and manage clothing during toileting tasks without UE support or LOB.    Special needs/care consideration Special service needs visual deficits and Designated visitor Meagan Spease (son)     Previous Home Environment (from acute therapy documentation) Living Arrangements: Children  Lives With: Son Available Help at Discharge: Family,Available 24 hours/day Type of Home: House Home Layout: One level Home Access: Level entry Bathroom Shower/Tub: Multimedia programmer: Handicapped height  Discharge Living Setting Plans for Discharge Living Setting: Lives with (comment) (children) Type of Home at Discharge: House Discharge Talent: One level (sunken den, but pt does not need to  access) Discharge Home Access: Level entry Discharge Bathroom Shower/Tub: Walk-in shower Discharge Bathroom Toilet: Handicapped height Discharge Bathroom Accessibility: Yes How Accessible: Accessible via walker Does the patient have any problems obtaining your medications?: No  Social/Family/Support Systems Anticipated Caregiver: Son [name] is primary caregiver (also has daughter in law, grand children, and daughter available to assist) Anticipated Caregiver's Contact Information: [name]: [phone] Ability/Limitations of Caregiver: n/a Caregiver Availability: 24/7 Discharge Plan Discussed with Primary Caregiver: Yes Is Caregiver In Agreement with Plan?: Yes Does Caregiver/Family have Issues with Lodging/Transportation while Pt is in Rehab?: No   Goals Patient/Family Goal for Rehab: PT/OT supervision, SLP supervision to min assist Expected length of stay: 8-12 days Pt/Family Agrees to Admission and willing to participate: Yes Program Orientation Provided & Reviewed with Pt/Caregiver Including Roles  & Responsibilities: Yes   Decrease burden of Care through IP rehab admission: n/a   Possible need for SNF placement upon discharge: No.  Pt with supervision level goals and excellent family support.   Patient Condition: This patient's medical and functional status has changed since the consult dated: 01/30/2021 in which the Rehabilitation Physician determined and documented that the patient's condition is appropriate for intensive rehabilitative care in an inpatient rehabilitation facility. See "History of Present Illness" (above) for medical update. Functional changes are: pt min assist with mobility and ADLs. Patient's medical and functional status update has been discussed with the Rehabilitation physician and patient remains appropriate for inpatient rehabilitation. Will admit to inpatient rehab today.  Preadmission Screen Completed By:  Michel Santee, PT, DPT 02/01/2021 10:20  AM ______________________________________________________________________   Discussed status with Dr. Dagoberto Ligas on 02/01/21 at 10:30 AM  and received approval for admission today.  Admission Coordinator:  Michel Santee, PT, DPT time 10:31 AM Sudie Grumbling 02/01/21

## 2021-02-01 NOTE — Progress Notes (Signed)
Physical Medicine and Rehabilitation Consult Reason for Consult: Gait abnormality, right facial droop and aphasia Referring Physician: [facility]     HPI: Douglas Walker is a 87 y.o. right-handed male with history of essential tremor maintained on primidone, chronic kidney disease stage III, legally blind, CHF, CAD, PAF on Coumadin in the past but discontinued due to hematoma on his back, GERD hypertension hyperlipidemia TIA tachycardia syndrome with permanent pacemaker.  Per chart review patient lives with his son.  1 level home.  Family assistance as needed.  Used a rolling walker at baseline.  He attends to his ADLs independently.  Presented 01/28/2021 with right facial droop and aphasia of acute onset.  Cranial CT scan negative for acute changes.  CT angiogram of head and neck no emergent large vessel occlusion.  Echocardiogram with ejection fraction of 50 to 55% no wall motion abnormalities.  Admission chemistries unremarkable except glucose 105 BUN 34 creatinine 1.54, lactic acid 1.0, ammonia 21.  Currently maintained on aspirin for CVA prophylaxis.  Subcutaneous heparin for DVT prophylaxis.  Dysphagia #1 thin liquid diet.  Due to patient decrease in functional mobility physical medicine rehab consult requested.     Review of Systems  HENT: Positive for hearing loss.   Eyes:       Legally blind  Respiratory: Negative for cough and shortness of breath.   Cardiovascular: Positive for palpitations. Negative for chest pain and leg swelling.  Gastrointestinal: Positive for constipation. Negative for heartburn, nausea and vomiting.       GERD  Genitourinary: Positive for urgency. Negative for dysuria, flank pain and hematuria.  Musculoskeletal: Positive for back pain.  Neurological: Positive for speech change and weakness.  All other systems reviewed and are negative.       Past Medical History:  Diagnosis Date  . Arthritis      shoulders and back  . Cancer ([place])      skin  cancers  . Chronic low back pain 12/15/2017  . CKD (chronic kidney disease) stage 3, GFR 30-59 ml/min (HCC)    . Coronary artery disease    . Dysrhythmia       Paroxysmal atrial fibrillation; Tachycardia-bradycardia syndrome  . Essential tremor 02/28/2016  . GERD (gastroesophageal reflux disease)    . Hernia    . History of hiatal hernia    . Hyperlipemia    . Hypertension    . Macular degeneration    . Neuromuscular disorder (HCC)      neuropathy  . Paroxysmal atrial fibrillation ([place]) 10/28/2013  . Presence of permanent cardiac pacemaker 12/04/2018  . Tachycardia-bradycardia syndrome ([place]) 09/13/2014  . TIA (transient ischemic attack)           Past Surgical History:  Procedure Laterality Date  . APPENDECTOMY      . EYE SURGERY      . HERNIA REPAIR      . HERNIA REPAIR      . INSERT / REPLACE / REMOVE PACEMAKER   12/04/2018  . IRRIGATION AND DEBRIDEMENT ABSCESS Right 07/24/2020    Procedure: IRRIGATION AND DEBRIDEMENT HEMATOMA;  Surgeon: Coralie Keens, MD;  Location: [facility];  Service: General;  Laterality: Right;  . MASS EXCISION Right 07/20/2020    Procedure: EXCISION OF RIGHT CHEST WALL MASS;  Surgeon: Stark Klein, MD;  Location: [facility];  Service: General;  Laterality: Right;  . PACEMAKER IMPLANT N/A 12/04/2018    Procedure: PACEMAKER IMPLANT;  Surgeon: Evans Lance, MD;  Location: [facility];  Service: Cardiovascular;  Laterality: N/A;         Family History  Problem Relation Age of Onset  . GI problems Mother    . Other Sister          PAIN ISSUES  . Hearing loss Sister    . Blindness Sister    . Heart attack Neg Hx    . Stroke Neg Hx      Social History:  reports that he quit smoking about 58 years ago. He has never used smokeless tobacco. He reports that he does not drink alcohol and does not use drugs. Allergies:       Allergies  Allergen Reactions  . Sulfa Antibiotics Other (See Comments)      Weakness, dizziness  . Primidone        Other  reaction(s): increased sedation  . Simvastatin        Other reaction(s): arthralgias and fatigue, severe          Medications Prior to Admission  Medication Sig Dispense Refill  . acetaminophen (TYLENOL) 500 MG tablet Take 1,000 mg by mouth 2 (two) times daily as needed for moderate pain.      Marland Kitchen aspirin EC 81 MG tablet Take 81 mg by mouth daily. Swallow whole.      . beta carotene w/minerals (OCUVITE) tablet Take 1 tablet by mouth daily.       . DULoxetine (CYMBALTA) 20 MG capsule Take 20 mg by mouth in the morning, at noon, and at bedtime.      . finasteride (PROSCAR) 5 MG tablet Take 5 mg by mouth daily.      . furosemide (LASIX) 20 MG tablet Take 60 mg by mouth daily.       Marland Kitchen levothyroxine (SYNTHROID) 25 MCG tablet TAKE 1 TABLET (25 MCG TOTAL) BY MOUTH DAILY BEFORE BREAKFAST. 90 tablet 1  . Multiple Vitamin (MULTIVITAMIN WITH MINERALS) TABS tablet Take 1 tablet by mouth daily.      . pregabalin (LYRICA) 25 MG capsule TAKE 1 CAPSULE(25 MG) BY MOUTH AT BEDTIME (Patient taking differently: Take 25 mg by mouth daily.) 90 capsule 1  . primidone (MYSOLINE) 250 MG tablet Take 250 mg by mouth 2 (two) times daily.   11  . senna (SENOKOT) 8.6 MG tablet Take 1 tablet by mouth daily as needed for constipation.      . Tamsulosin HCl (FLOMAX) 0.4 MG CAPS Take 0.4 mg by mouth 2 (two) times daily.       Marland Kitchen topiramate (TOPAMAX) 50 MG tablet Take 1 tablet (50 mg total) by mouth 2 (two) times daily. 180 tablet 3  . vitamin B-12 (CYANOCOBALAMIN) 1000 MCG tablet Take 1,000 mcg by mouth every other day.          Home: Home Living Family/patient expects to be discharged to:: Private residence Living Arrangements: Children Available Help at Discharge: Family,Available 24 hours/day Type of Home: House Home Access: Level entry [place]: One level Bathroom Shower/Tub: Multimedia programmer: Handicapped height [place]: Shower seat,Grab bars - toilet,Walker - 2 wheels,Cane - single  point,Bedside commode,Grab bars - tub/shower,Hand held shower head,Wheelchair - manual  Functional History: Prior Function Level of Independence: Needs assistance Gait / Transfers Assistance Needed: Uses RW at baseline. ADL's / Homemaking Assistance Needed: Completed ADLs with Mod I. Pt son assists with IADL's i.e. cooking. Functional Status:  Mobility: Bed Mobility Overal bed mobility: Needs Assistance Bed Mobility: Supine to Sit Supine to  sit: Min guard General bed mobility comments: OOB in recliner upon entry Transfers Overall transfer level: Needs assistance Equipment used: Rolling walker (2 wheeled) Transfers: Sit to/from Stand Sit to Stand: Min assist Stand pivot transfers: Min guard General transfer comment: MinA to boost to standing with RW, good carryover of hand placement form OT session; condom cath immediately slid off onto the floor when we stood up Ambulation/Gait Ambulation/Gait assistance: Min Web designer (Feet): 80 Feet Assistive device: Rolling walker (2 wheeled) Gait Pattern/deviations: Step-through pattern,Decreased step length - right,Decreased step length - left,Narrow base of support,Trendelenburg,Drifts right/left,Trunk flexed General Gait Details: slow with RW, able to maintain safe proximity from device but with strong drift to the right; ran into obstacles in the hallway multiple times and was surprised (question R sided vision impairment post-CVA) and needed MinA/Max cues to avoid running into other obstacles on his right side. VSS on RA. Gait velocity: decreased   ADL:   Cognition: Cognition Overall Cognitive Status: Difficult to assess Orientation Level: Oriented to person,Oriented to situation Cognition Arousal/Alertness: Awake/alert Behavior During Therapy: Flat affect,Impulsive Overall Cognitive Status: Difficult to assess General Comments: makes multiple attempts to verbalize but gets frustrated when he cannot get the words out due to  aphasia. Cognition and cue following difficult to assess due to combination of aphasia and being very HOH. Mild impulsivity with transfers. Difficult to assess due to: Hard of hearing/deaf (aphasic)   Blood pressure (!) 152/72, pulse 69, temperature 98.2 F (36.8 C), temperature source Oral, resp. rate 15, height 5' 10.5" (1.791 m), weight 92.3 kg, SpO2 95 %. Physical Exam Constitutional:      General: He is not in acute distress. HENT:     Head: Normocephalic.     Right Ear: External ear normal.     Left Ear: External ear normal.     Nose: Nose normal.  Cardiovascular:     Rate and Rhythm: Normal rate.     Heart sounds: No murmur heard. No gallop.   Pulmonary:     Effort: No respiratory distress.  Abdominal:     General: There is no distension.  Musculoskeletal:     Cervical back: Normal range of motion.  Neurological:     Mental Status: He is alert.     Comments: Patient is alert and hard of hearing.  Makes eye contact with examiner. Continuous facial tic/tremor. Right facial droop. Aphasic but will occasionally utter something spontaneously which is pertinent to conversation. Vision is impaired. He tracks more easily to right than left. Asked him to ID his styrofoam drinking cup. He could not, but reached for it and sipped from the straw without any problems. RUE grossly 3+ to 4-/5 with delays in movement, apraxia. RLE 2 to 3-/5 prox to distal with apraxia. When I pinched his right leg he said "pinch". DTR's 2+  Psychiatric:     Comments: Pt was flat but cooperative        Lab Results Last 24 Hours  No results found for this or any previous visit (from the past 24 hour(s)).    Imaging Results (Last 48 hours)  CT Code Stroke CTA Head W/WO contrast   Result Date: 01/28/2021 CLINICAL DATA:  Facial droop.  Acute stroke suspected EXAM: CT ANGIOGRAPHY HEAD AND NECK CT PERFUSION BRAIN TECHNIQUE: Multidetector CT imaging of the head and neck was performed using the standard protocol  during bolus administration of intravenous contrast. Multiplanar CT image reconstructions and MIPs were obtained to evaluate the vascular anatomy. Carotid  stenosis measurements (when applicable) are obtained utilizing NASCET criteria, using the distal internal carotid diameter as the denominator. Multiphase CT imaging of the brain was performed following IV bolus contrast injection. Subsequent parametric perfusion maps were calculated using RAPID software. CONTRAST:  Dose is currently not known COMPARISON:  Head CT from earlier today FINDINGS: CTA NECK FINDINGS Aortic arch: Atheromatous plaque with 3 vessel branching. Right carotid system: Mild for age atheromatous plaque mainly at the bifurcation where there is mild proximal ICA narrowing mainly due to wasting rather than luminal plaque buildup. No ulceration. Left carotid system: Atheromatous wall thickening of the common carotid and proximal ICA, overall mild for age. No stenosis or ulceration. Vertebral arteries: No proximal subclavian stenosis. Mild left vertebral artery dominance. Scattered atheromatous calcifications of the vertebral arteries without flow limiting stenosis. No beading or dissection. Skeleton: No acute finding Other neck: No evidence of inflammation or mass. Upper chest: Negative Review of the MIP images confirms the above findings CTA HEAD FINDINGS Anterior circulation: Atheromatous calcification of the bilateral carotid siphon. No large vessel occlusion, beading, or proximal stenosis. A left M3/4 branch occlusion is noted, as marked on sagittal reformats. Posterior circulation: Mild left vertebral artery dominance. Vertebral and basilar arteries are smooth and widely patent. Fetal type right PCA. No branch occlusion, beading, or aneurysm Venous sinuses: Diffusely patent Anatomic variants: As above Review of the MIP images confirms the above findings CT Brain Perfusion Findings: ASPECTS: 10 CBF (<30%) Volume: 28mL Perfusion (Tmax>6.0s) volume:  39mL Mismatch Volume: 35mL IMPRESSION: 1. Left M3/4 branch occlusion with 25 cc of ischemic range cerebral perfusion. 2. No emergent large vessel occlusion. 3. Atherosclerosis without proximal flow limiting stenosis. Electronically Signed   By: Monte Fantasia M.D.   On: 01/28/2021 11:25    DG Chest 1 View   Result Date: 01/28/2021 CLINICAL DATA:  Code stroke. EXAM: CHEST  1 VIEW COMPARISON:  Chest x-ray dated 08/24/2020 FINDINGS: Stable cardiomegaly. Overall cardiomediastinal silhouette is stable in size and configuration. LEFT chest wall pacemaker/ICD apparatus appears stable in position. Lungs are clear.  No pleural effusion or pneumothorax is seen. IMPRESSION: No active cardiopulmonary disease. No evidence of pneumonia or pulmonary edema. Stable cardiomegaly. Electronically Signed   By: Franki Cabot M.D.   On: 01/28/2021 11:46    CT Code Stroke CTA Neck W/WO contrast   Result Date: 01/28/2021 CLINICAL DATA:  Facial droop.  Acute stroke suspected EXAM: CT ANGIOGRAPHY HEAD AND NECK CT PERFUSION BRAIN TECHNIQUE: Multidetector CT imaging of the head and neck was performed using the standard protocol during bolus administration of intravenous contrast. Multiplanar CT image reconstructions and MIPs were obtained to evaluate the vascular anatomy. Carotid stenosis measurements (when applicable) are obtained utilizing NASCET criteria, using the distal internal carotid diameter as the denominator. Multiphase CT imaging of the brain was performed following IV bolus contrast injection. Subsequent parametric perfusion maps were calculated using RAPID software. CONTRAST:  Dose is currently not known COMPARISON:  Head CT from earlier today FINDINGS: CTA NECK FINDINGS Aortic arch: Atheromatous plaque with 3 vessel branching. Right carotid system: Mild for age atheromatous plaque mainly at the bifurcation where there is mild proximal ICA narrowing mainly due to wasting rather than luminal plaque buildup. No ulceration.  Left carotid system: Atheromatous wall thickening of the common carotid and proximal ICA, overall mild for age. No stenosis or ulceration. Vertebral arteries: No proximal subclavian stenosis. Mild left vertebral artery dominance. Scattered atheromatous calcifications of the vertebral arteries without flow limiting stenosis. No beading or  dissection. Skeleton: No acute finding Other neck: No evidence of inflammation or mass. Upper chest: Negative Review of the MIP images confirms the above findings CTA HEAD FINDINGS Anterior circulation: Atheromatous calcification of the bilateral carotid siphon. No large vessel occlusion, beading, or proximal stenosis. A left M3/4 branch occlusion is noted, as marked on sagittal reformats. Posterior circulation: Mild left vertebral artery dominance. Vertebral and basilar arteries are smooth and widely patent. Fetal type right PCA. No branch occlusion, beading, or aneurysm Venous sinuses: Diffusely patent Anatomic variants: As above Review of the MIP images confirms the above findings CT Brain Perfusion Findings: ASPECTS: 10 CBF (<30%) Volume: 33mL Perfusion (Tmax>6.0s) volume: 84mL Mismatch Volume: 33mL IMPRESSION: 1. Left M3/4 branch occlusion with 25 cc of ischemic range cerebral perfusion. 2. No emergent large vessel occlusion. 3. Atherosclerosis without proximal flow limiting stenosis. Electronically Signed   By: Monte Fantasia M.D.   On: 01/28/2021 11:25    CT Code Stroke Cerebral Perfusion with contrast   Result Date: 01/28/2021 CLINICAL DATA:  Facial droop.  Acute stroke suspected EXAM: CT ANGIOGRAPHY HEAD AND NECK CT PERFUSION BRAIN TECHNIQUE: Multidetector CT imaging of the head and neck was performed using the standard protocol during bolus administration of intravenous contrast. Multiplanar CT image reconstructions and MIPs were obtained to evaluate the vascular anatomy. Carotid stenosis measurements (when applicable) are obtained utilizing NASCET criteria, using the  distal internal carotid diameter as the denominator. Multiphase CT imaging of the brain was performed following IV bolus contrast injection. Subsequent parametric perfusion maps were calculated using RAPID software. CONTRAST:  Dose is currently not known COMPARISON:  Head CT from earlier today FINDINGS: CTA NECK FINDINGS Aortic arch: Atheromatous plaque with 3 vessel branching. Right carotid system: Mild for age atheromatous plaque mainly at the bifurcation where there is mild proximal ICA narrowing mainly due to wasting rather than luminal plaque buildup. No ulceration. Left carotid system: Atheromatous wall thickening of the common carotid and proximal ICA, overall mild for age. No stenosis or ulceration. Vertebral arteries: No proximal subclavian stenosis. Mild left vertebral artery dominance. Scattered atheromatous calcifications of the vertebral arteries without flow limiting stenosis. No beading or dissection. Skeleton: No acute finding Other neck: No evidence of inflammation or mass. Upper chest: Negative Review of the MIP images confirms the above findings CTA HEAD FINDINGS Anterior circulation: Atheromatous calcification of the bilateral carotid siphon. No large vessel occlusion, beading, or proximal stenosis. A left M3/4 branch occlusion is noted, as marked on sagittal reformats. Posterior circulation: Mild left vertebral artery dominance. Vertebral and basilar arteries are smooth and widely patent. Fetal type right PCA. No branch occlusion, beading, or aneurysm Venous sinuses: Diffusely patent Anatomic variants: As above Review of the MIP images confirms the above findings CT Brain Perfusion Findings: ASPECTS: 10 CBF (<30%) Volume: 71mL Perfusion (Tmax>6.0s) volume: 46mL Mismatch Volume: 75mL IMPRESSION: 1. Left M3/4 branch occlusion with 25 cc of ischemic range cerebral perfusion. 2. No emergent large vessel occlusion. 3. Atherosclerosis without proximal flow limiting stenosis. Electronically Signed   By:  Monte Fantasia M.D.   On: 01/28/2021 11:25    ECHOCARDIOGRAM COMPLETE   Result Date: 01/28/2021    ECHOCARDIOGRAM REPORT   Patient Name:   Douglas Walker Date of Exam: 01/28/2021 Medical Rec #:  [id number]          Height:       70.5 in Accession #:    [id number]         Weight:  203.5 lb Date of Birth:  February 21, 1925          BSA:          2.114 m Patient Age:    89 years           BP:           176/81 mmHg Patient Gender: M                  HR:           70 bpm. Exam Location:  Inpatient Procedure: 2D Echo Indications:    TIA 435.9  History:        Patient has prior history of Echocardiogram examinations, most                 recent 05/19/2015. Chronic kidney disease, Arrythmias:Atrial                 Fibrillation; Risk Factors:Hypertension.  Sonographer:    Delcie Roch Referring Phys: [id number] PING T ZHANG  Sonographer Comments: Image acquisition challenging due to uncooperative patient. IMPRESSIONS  1. Left ventricular ejection fraction, by estimation, is 50 to 55%. The left ventricle has low normal function. The left ventricle has no regional wall motion abnormalities. Left ventricular diastolic function could not be evaluated.  2. Right ventricular systolic function is mildly reduced. The right ventricular size is normal. There is moderately elevated pulmonary artery systolic pressure.  3. Left atrial size was moderately dilated.  4. Right atrial size was moderately dilated.  5. The mitral valve is normal in structure. No evidence of mitral valve regurgitation.  6. The aortic valve is tricuspid. Aortic valve regurgitation is mild. Mild aortic valve sclerosis is present, with no evidence of aortic valve stenosis.  7. Aortic dilatation noted. There is moderate dilatation of the ascending aorta, measuring 47 mm.  8. The inferior vena cava is dilated in size with <50% respiratory variability, suggesting right atrial pressure of 15 mmHg. Comparison(s): Prior images unable to be directly viewed,  comparison made by report only. Ascending aorta dilation has worsened since 2019 (but measurement is similar to CT angio report from may 2021). FINDINGS  Left Ventricle: Left ventricular ejection fraction, by estimation, is 50 to 55%. The left ventricle has low normal function. The left ventricle has no regional wall motion abnormalities. The left ventricular internal cavity size was normal in size. There is no left ventricular hypertrophy. Abnormal (paradoxical) septal motion, consistent with RV pacemaker. Left ventricular diastolic function could not be evaluated due to atrial fibrillation. Left ventricular diastolic function could not be evaluated. Right Ventricle: The right ventricular size is normal. No increase in right ventricular wall thickness. Right ventricular systolic function is mildly reduced. There is moderately elevated pulmonary artery systolic pressure. The tricuspid regurgitant velocity is 2.76 m/s, and with an assumed right atrial pressure of 15 mmHg, the estimated right ventricular systolic pressure is 45.5 mmHg. Left Atrium: Left atrial size was moderately dilated. Right Atrium: Right atrial size was moderately dilated. Pericardium: There is no evidence of pericardial effusion. Mitral Valve: The mitral valve is normal in structure. No evidence of mitral valve regurgitation. Tricuspid Valve: The tricuspid valve is normal in structure. Tricuspid valve regurgitation is mild. Aortic Valve: The aortic valve is tricuspid. Aortic valve regurgitation is mild. Mild aortic valve sclerosis is present, with no evidence of aortic valve stenosis. Pulmonic Valve: The pulmonic valve was not well visualized. Pulmonic valve regurgitation is not visualized. Aorta: Aortic dilatation noted. There is moderate  dilatation of the ascending aorta, measuring 47 mm. Venous: The inferior vena cava is dilated in size with less than 50% respiratory variability, suggesting right atrial pressure of 15 mmHg. IAS/Shunts: No  atrial level shunt detected by color flow Doppler. Additional Comments: A pacer wire is visualized.  LEFT VENTRICLE PLAX 2D LVIDd:         4.70 cm Diastology LVIDs:         3.40 cm LV e' medial:    5.87 cm/s LV PW:         1.30 cm LV E/e' medial:  21.0 LV IVS:        1.00 cm LV e' lateral:   10.40 cm/s                        LV E/e' lateral: 11.8  RIGHT VENTRICLE            IVC RV S prime:     8.38 cm/s  IVC diam: 2.40 cm TAPSE (M-mode): 1.1 cm LEFT ATRIUM             Index       RIGHT ATRIUM           Index LA diam:        4.20 cm 1.99 cm/m  RA Area:     16.00 cm LA Vol (A2C):   63.8 ml 30.18 ml/m RA Volume:   41.30 ml  19.54 ml/m LA Vol (A4C):   67.7 ml 32.03 ml/m LA Biplane Vol: 65.7 ml 31.08 ml/m  AORTIC VALVE LVOT Vmax:   97.90 cm/s LVOT Vmean:  62.500 cm/s LVOT VTI:    0.228 m  AORTA Ao Asc diam: 4.70 cm MITRAL VALVE                TRICUSPID VALVE MV Area (PHT): 3.60 cm     TR Peak grad:   30.5 mmHg MV Decel Time: 211 msec     TR Vmax:        276.00 cm/s MV E velocity: 123.00 cm/s MV A velocity: 33.10 cm/s   SHUNTS MV E/A ratio:  3.72         Systemic VTI: 0.23 m Dani Gobble Croitoru MD Electronically signed by Sanda Klein MD Signature Date/Time: 01/28/2021/2:10:39 PM    Final     CT HEAD CODE STROKE WO CONTRAST   Result Date: 01/28/2021 CLINICAL DATA:  Code stroke. Facial droop and altered mental status, side not specified EXAM: CT HEAD WITHOUT CONTRAST TECHNIQUE: Contiguous axial images were obtained from the base of the skull through the vertex without intravenous contrast. COMPARISON:  07/18/2015 FINDINGS: Brain: No evidence of acute infarction, hemorrhage, hydrocephalus, extra-axial collection or mass lesion/mass effect. Generalized atrophy. Moderate chronic small vessel ischemia in the cerebral white matter. Vascular: No definite hyperdense vessel. A left M2 branch appears prominent in density on coronal reformats but not on the other projections. Skull: Normal. Negative for fracture or focal  lesion. Sinuses/Orbits: No acute finding. Other: These results were communicated to Dr. Curly Shores at 11:00 amon 1/30/2022by text page via the [facility] messaging system. ASPECTS [facility] Stroke Program Early CT Score) - Ganglionic level infarction (caudate, lentiform nuclei, internal capsule, insula, M1-M3 cortex): 7 - Supraganglionic infarction (M4-M6 cortex): 3 Total score (0-10 with 10 being normal): 10 IMPRESSION: 1. Aging brain without acute finding. 2. ASPECTS is 10 Electronically Signed   By: Monte Fantasia M.D.   On: 01/28/2021 11:02  Assessment/Plan: Diagnosis: Left cortical infarct, potentially embolic left MCA with right hemiparesis and aphasia 1. Does the need for close, 24 hr/day medical supervision in concert with the patient's rehab needs make it unreasonable for this patient to be served in a less intensive setting? Yes 2. Co-Morbidities requiring supervision/potential complications: CHF, HTN, PAF 3. Due to bladder management, bowel management, safety, skin/wound care, disease management, medication administration, pain management and patient education, does the patient require 24 hr/day rehab nursing? Yes 4. Does the patient require coordinated care of a physician, rehab nurse, therapy disciplines of PT, OT, SLP to address physical and functional deficits in the context of the above medical diagnosis(es)? Yes Addressing deficits in the following areas: balance, endurance, locomotion, strength, transferring, bowel/bladder control, bathing, dressing, feeding, grooming, toileting, cognition, speech, language and psychosocial support 5. Can the patient actively participate in an intensive therapy program of at least 3 hrs of therapy per day at least 5 days per week? Yes 6. The potential for patient to make measurable gains while on inpatient rehab is excellent 7. Anticipated functional outcomes upon discharge from inpatient rehab are supervision  with PT, supervision with OT, min assist  with SLP. 8. Estimated rehab length of stay to reach the above functional goals is: 8-13 days 9. Anticipated discharge destination: Home 10. Overall Rehab/Functional Prognosis: excellent   RECOMMENDATIONS: This patient's condition is appropriate for continued rehabilitative care in the following setting: CIR Patient has agreed to participate in recommended program. Yes (son,family) Note that insurance prior authorization may be required for reimbursement for recommended care.   Comment: Pt was active and fairly independent prior to admit. He has good family supports. Rehab Admissions Coordinator to follow up.   Thanks,   Meredith Staggers, MD, Mell[facility] Drown   I have personally performed a face to face diagnostic evaluation of this patient. Additionally, I have examined pertinent labs and radiographic images. I have reviewed and concur with the physician assistant's documentation above.     Lavon Paganini Angiulli, PA-C 01/30/2021

## 2021-02-02 LAB — COMPREHENSIVE METABOLIC PANEL
ALT: 24 U/L (ref 0–44)
AST: 22 U/L (ref 15–41)
Albumin: 2.9 g/dL — ABNORMAL LOW (ref 3.5–5.0)
Alkaline Phosphatase: 121 U/L (ref 38–126)
Anion gap: 9 (ref 5–15)
BUN: 27 mg/dL — ABNORMAL HIGH (ref 8–23)
CO2: 25 mmol/L (ref 22–32)
Calcium: 8.8 mg/dL — ABNORMAL LOW (ref 8.9–10.3)
Chloride: 109 mmol/L (ref 98–111)
Creatinine, Ser: 1.37 mg/dL — ABNORMAL HIGH (ref 0.61–1.24)
GFR, Estimated: 48 mL/min — ABNORMAL LOW (ref >60)
Glucose, Bld: 126 mg/dL — ABNORMAL HIGH (ref 70–99)
Potassium: 3.6 mmol/L (ref 3.5–5.1)
Sodium: 143 mmol/L (ref 135–145)
Total Bilirubin: 0.4 mg/dL (ref 0.3–1.2)
Total Protein: 6.1 g/dL — ABNORMAL LOW (ref 6.5–8.1)

## 2021-02-02 LAB — CBC WITH DIFFERENTIAL/PLATELET
Abs Immature Granulocytes: 0.03 10*3/uL (ref 0.00–0.07)
Basophils Absolute: 0 10*3/uL (ref 0.0–0.1)
Basophils Relative: 1 %
Eosinophils Absolute: 0.3 10*3/uL (ref 0.0–0.5)
Eosinophils Relative: 4 %
HCT: 31.3 % — ABNORMAL LOW (ref 39.0–52.0)
Hemoglobin: 10.6 g/dL — ABNORMAL LOW (ref 13.0–17.0)
Immature Granulocytes: 1 %
Lymphocytes Relative: 19 %
Lymphs Abs: 1.2 10*3/uL (ref 0.7–4.0)
MCH: 33.1 pg (ref 26.0–34.0)
MCHC: 33.9 g/dL (ref 30.0–36.0)
MCV: 97.8 fL (ref 80.0–100.0)
Monocytes Absolute: 0.9 10*3/uL (ref 0.1–1.0)
Monocytes Relative: 13 %
Neutro Abs: 4.1 10*3/uL (ref 1.7–7.7)
Neutrophils Relative %: 62 %
Platelets: 100 10*3/uL — ABNORMAL LOW (ref 150–400)
RBC: 3.2 MIL/uL — ABNORMAL LOW (ref 4.22–5.81)
RDW: 16.3 % — ABNORMAL HIGH (ref 11.5–15.5)
WBC: 6.5 10*3/uL (ref 4.0–10.5)
nRBC: 0 % (ref 0.0–0.2)

## 2021-02-02 LAB — GLUCOSE, CAPILLARY
Glucose-Capillary: 99 mg/dL (ref 70–99)
Glucose-Capillary: 85 mg/dL (ref 70–99)
Glucose-Capillary: 114 mg/dL — ABNORMAL HIGH (ref 70–99)

## 2021-02-02 MED ORDER — HYDROCORTISONE 1 % EX CREA
TOPICAL_CREAM | Freq: Three times a day (TID) | CUTANEOUS | Status: DC
  Administered 2021-02-02 – 2021-02-03 (×2): 1 via TOPICAL
  Filled 2021-02-02 (×3): qty 28

## 2021-02-02 NOTE — Plan of Care (Signed)
  Problem: RH Balance Goal: LTG Patient will maintain dynamic standing with ADLs (OT) Description: LTG:  Patient will maintain dynamic standing balance with assist during activities of daily living (OT)  Flowsheets (Taken 02/02/2021 1607) LTG: Pt will maintain dynamic standing balance during ADLs with: Supervision/Verbal cueing   Problem: Sit to Stand Goal: LTG:  Patient will perform sit to stand in prep for activites of daily living with assistance level (OT) Description: LTG:  Patient will perform sit to stand in prep for activites of daily living with assistance level (OT) Flowsheets (Taken 02/02/2021 1607) LTG: PT will perform sit to stand in prep for activites of daily living with assistance level: Supervision/Verbal cueing   Problem: RH Eating Goal: LTG Patient will perform eating w/assist, cues/equip (OT) Description: LTG: Patient will perform eating with assist, with/without cues using equipment (OT) Flowsheets (Taken 02/02/2021 1607) LTG: Pt will perform eating with assistance level of: Supervision/Verbal cueing   Problem: RH Grooming Goal: LTG Patient will perform grooming w/assist,cues/equip (OT) Description: LTG: Patient will perform grooming with assist, with/without cues using equipment (OT) Flowsheets (Taken 02/02/2021 1607) LTG: Pt will perform grooming with assistance level of: Supervision/Verbal cueing   Problem: RH Bathing Goal: LTG Patient will bathe all body parts with assist levels (OT) Description: LTG: Patient will bathe all body parts with assist levels (OT) Flowsheets (Taken 02/02/2021 1607) LTG: Pt will perform bathing with assistance level/cueing: Supervision/Verbal cueing   Problem: RH Dressing Goal: LTG Patient will perform upper body dressing (OT) Description: LTG Patient will perform upper body dressing with assist, with/without cues (OT). Flowsheets (Taken 02/02/2021 1607) LTG: Pt will perform upper body dressing with assistance level of: Supervision/Verbal  cueing Goal: LTG Patient will perform lower body dressing w/assist (OT) Description: LTG: Patient will perform lower body dressing with assist, with/without cues in positioning using equipment (OT) Flowsheets (Taken 02/02/2021 1607) LTG: Pt will perform lower body dressing with assistance level of: (excluding Teds)  Supervision/Verbal cueing  Other (Comment)   Problem: RH Toileting Goal: LTG Patient will perform toileting task (3/3 steps) with assistance level (OT) Description: LTG: Patient will perform toileting task (3/3 steps) with assistance level (OT)  Flowsheets (Taken 02/02/2021 1607) LTG: Pt will perform toileting task (3/3 steps) with assistance level: Supervision/Verbal cueing   Problem: RH Toilet Transfers Goal: LTG Patient will perform toilet transfers w/assist (OT) Description: LTG: Patient will perform toilet transfers with assist, with/without cues using equipment (OT) Flowsheets (Taken 02/02/2021 1607) LTG: Pt will perform toilet transfers with assistance level of: Supervision/Verbal cueing   Problem: RH Tub/Shower Transfers Goal: LTG Patient will perform tub/shower transfers w/assist (OT) Description: LTG: Patient will perform tub/shower transfers with assist, with/without cues using equipment (OT) Flowsheets (Taken 02/02/2021 1607) LTG: Pt will perform tub/shower stall transfers with assistance level of: Supervision/Verbal cueing

## 2021-02-02 NOTE — Evaluation (Signed)
Occupational Therapy Assessment and Plan  Patient Details  Name: DEMETRICK EICHENBERGER MRN: [id number] Date of Birth: November 17, 1925  OT Diagnosis: acute pain, blindness and low vision, cognitive deficits, hemiplegia affecting dominant side, muscle weakness (generalized) and coordination disorder Rehab Potential: Rehab Potential (ACUTE ONLY): Good ELOS: 10-12 days   Today's Date: 02/02/2021 OT Individual Time: 1300-1414 OT Individual Time Calculation (min): 74 min     Hospital Problem: Principal Problem:   Left middle cerebral artery stroke [facility])   Past Medical History:  Past Medical History:  Diagnosis Date  . Arthritis    shoulders and back  . Cancer ([place])    skin cancers  . Chronic low back pain 12/15/2017  . CKD (chronic kidney disease) stage 3, GFR 30-59 ml/min (HCC)   . Coronary artery disease   . Dysrhythmia     Paroxysmal atrial fibrillation; Tachycardia-bradycardia syndrome  . Essential tremor 02/28/2016  . GERD (gastroesophageal reflux disease)   . Hernia   . History of hiatal hernia   . Hyperlipemia   . Hypertension   . Macular degeneration   . Neuromuscular disorder (HCC)    neuropathy  . Paroxysmal atrial fibrillation ([place]) 10/28/2013  . Presence of permanent cardiac pacemaker 12/04/2018  . Tachycardia-bradycardia syndrome ([place]) 09/13/2014  . TIA (transient ischemic attack)    Past Surgical History:  Past Surgical History:  Procedure Laterality Date  . APPENDECTOMY    . EYE SURGERY    . HERNIA REPAIR    . HERNIA REPAIR    . INSERT / REPLACE / REMOVE PACEMAKER  12/04/2018  . IRRIGATION AND DEBRIDEMENT ABSCESS Right 07/24/2020   Procedure: IRRIGATION AND DEBRIDEMENT HEMATOMA;  Surgeon: Coralie Keens, MD;  Location: [facility];  Service: General;  Laterality: Right;  . MASS EXCISION Right 07/20/2020   Procedure: EXCISION OF RIGHT CHEST WALL MASS;  Surgeon: Stark Klein, MD;  Location: [facility];  Service: General;  Laterality: Right;  . PACEMAKER IMPLANT N/A 12/04/2018    Procedure: PACEMAKER IMPLANT;  Surgeon: Evans Lance, MD;  Location: [facility];  Service: Cardiovascular;  Laterality: N/A;    Assessment & Plan Clinical Impression:  Hendrick L. Karnes is a 87 year old right-handed male with history of ESBL 07/2020, essential tremor maintained on primidone, chronic kidney disease stage III, legally blind, diastolic congestive heart failure, CAD, PAF on Coumadin in the past but discontinued due to hematoma on his back, hypertension, hyperlipidemia, TIA, tachycardia syndrome with permanent pacemaker.  Per chart review patient lives with his son 1 level home.  Family assistance as needed.  Uses a rolling walker at baseline.  He attends do his own ADLs independently.  Presented 01/28/2021 with right facial droop and aphasia of acute onset.  Cranial CT scan negative for acute changes.  CT angiogram of head and neck no emergent large vessel occlusion.  Left M3/4 branch occlusion with 25 cc of ischemic range cerebral perfusion.  Echocardiogram with ejection fraction of 50 to 55% no wall motion abnormalities.  Admission chemistries unremarkable except INR 1.1, glucose 105 BUN 34 creatinine 1.54 lactic acid 1.0 ammonia 21, urine drug screen positive barbiturates, SARS coronavirus negative.  Currently maintained on Eliquis for CVA prophylaxis.    Dysphagia #1 nectar thick liquid diet.  Due to patient's decrease in functional mobility he was admitted for a comprehensive rehab program.  Patient currently requires min with basic self-care skills secondary to muscle weakness, decreased cardiorespiratoy endurance, decreased coordination and decreased motor planning, blindness, decreased motor planning, decreased awareness and delayed processing  and decreased standing balance and hemiplegia.  Prior to hospitalization, patient could complete BADLs with supervision.  Patient will benefit from skilled intervention to increase independence with basic self-care skills prior to  discharge home with family support.  Anticipate patient will require 24 hour supervision and follow up home health.  OT Assessment Rehab Potential (ACUTE ONLY): Good OT Barriers to Discharge: Incontinence OT Patient demonstrates impairments in the following area(s): Balance;Cognition;Endurance;Perception;Pain;Safety;Skin Integrity;Vision OT Basic ADL's Functional Problem(s): Eating;Grooming;Bathing;Dressing;Toileting OT Advanced ADL's Functional Problem(s): Simple Meal Preparation OT Transfers Functional Problem(s): Toilet;Tub/Shower OT Additional Impairment(s): Fuctional Use of Upper Extremity OT Plan OT Intensity: Minimum of 1-2 x/day, 45 to 90 minutes OT Frequency: 5 out of 7 days OT Duration/Estimated Length of Stay: 10-12 days OT Treatment/Interventions: Balance/vestibular training;DME/adaptive equipment instruction;Patient/family education;Therapeutic Activities;Psychosocial support;Therapeutic Exercise;Cognitive remediation/compensation;Community reintegration;Functional mobility training;Self Care/advanced ADL retraining;UE/LE Strength taining/ROM;UE/LE Coordination activities;Skin care/wound managment;Neuromuscular re-education;Discharge planning;Disease mangement/prevention;Pain management;Visual/perceptual remediation/compensation OT Self Feeding Anticipated Outcome(s): Supervision OT Basic Self-Care Anticipated Outcome(s): Supervision OT Toileting Anticipated Outcome(s): Supervision OT Bathroom Transfers Anticipated Outcome(s): Supervision OT Recommendation Recommendations for Other Services:  (n/a) Follow Up Recommendations: Home health OT Equipment Recommended: To be determined   OT Evaluation Precautions/Restrictions  Precautions Precautions: Fall Precaution Comments: Aphasic, HOH, legally blind in L eye. Vital Signs Therapy Vitals Temp: 98.1 F (36.7 C) Pulse Rate: 70 Resp: 17 BP: 133/69 Patient Position (if appropriate): Lying Oxygen Therapy SpO2: 99 % O2  Device: Room Air Pain Pain Assessment Pain Scale: 0-10 Pain Score: 3  Faces Pain Scale: No hurt Pain Type: Acute pain Pain Location: Head Pain Intervention(s): Medication (See eMAR) Home Living/Prior [place] expects to be discharged to:: Private residence Living Arrangements: Children (Lives with son) Available Help at Discharge: Family,Available 24 hours/day Type of Home: House Home Access: Level entry Home Layout: One level Bathroom Shower/Tub: Multimedia programmer: Handicapped height Bathroom Accessibility: Yes  Lives With: Son IADL History 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 IADL Comments: Son assisted with IADLs, pt used a RW for household mobility PTA Prior Function Level of Independence: Independent with basic ADLs,Needs assistance with homemaking,Requires assistive device for independence (son reported that pt participated in ADLs with supervision assist PTA)  Able to Take Stairs?: No Driving: No Vision Patient Visual Report: Other (comment) (per chart pt legally blind) Perception  Perception: Within Functional Limits Praxis Praxis: Impaired Praxis Impairment Details: Motor planning Cognition Overall Cognitive Status: Impaired/Different from baseline Arousal/Alertness: Awake/alert Orientation Level: Person;Place;Situation Person: Oriented Place: Oriented (given option of 2) Situation: Oriented Year: Other (Comment) ("20227") Month: February (when given option of 2) Day of Week: Incorrect Immediate Memory Recall:  (BIMs not attempted due to expressive aphasia) Safety/Judgment: Impaired Sensation Coordination Gross Motor Movements are Fluid and Coordinated: No Fine Motor Movements are Fluid and Coordinated: No Coordination and Movement Description: Tremulous, most prominant in the Rt UE and face Finger Nose Finger Test: Tremulous Rt>Rt Motor  Motor Motor: Within Functional Limits Motor -  Skilled Clinical Observations: does have baseline essential tremor in R UE and at mouth  Trunk/Postural Assessment  Cervical Assessment Cervical Assessment: Exceptions to [facility] (forward head) Thoracic Assessment Thoracic Assessment: Exceptions to [facility] (kyphotic) Lumbar Assessment Lumbar Assessment: Exceptions to [facility] (posterior pelvic tilt) Postural Control Postural Control: Deficits on evaluation (limited in standing, needs AD for safety during transfers)  Balance Balance Balance Assessed: Yes Dynamic Sitting Balance Dynamic Sitting - Balance Support: Feet supported Dynamic Sitting - Level of Assistance: 5: Stand by assistance (donning gripper socks) Dynamic Standing Balance  Dynamic Standing - Balance Support: No upper extremity supported;During functional activity Dynamic Standing - Level of Assistance: 4: Min assist Dynamic Standing - Balance Activities: Lateral lean/weight shifting;Forward lean/weight shifting (LB dressing) Extremity/Trunk Assessment RUE Assessment RUE Assessment: Exceptions to [facility] (tremulous) Active Range of Motion (AROM) Comments: Shoulder mobility limited <90 degrees, pt lacking IR abilities to complete perihygiene in the back LUE Assessment LUE Assessment: Within Functional Limits (mildly tremulous compared to the Rt UE) Active Range of Motion (AROM) Comments: Shoulder mobility limited <90 degrees  Care Tool Care Tool Self Care Eating    Per RN, Max A    Oral Care    Oral Care Assist Level: Contact Guard/Toucning assist (standing)    Bathing   Body parts bathed by patient: Right arm;Left arm;Chest;Abdomen;Front perineal area;Buttocks;Right upper leg;Left upper leg;Right lower leg;Left lower leg;Face     Assist Level: Contact Guard/Touching assist    Upper Body Dressing(including orthotics)   What is the patient wearing?: Pull over shirt   Assist Level: Contact Guard/Touching assist    Lower Body Dressing (excluding footwear)   What is the patient  wearing?: Pants;Underwear/pull up Assist for lower body dressing: Moderate Assistance - Patient 50 - 74%    Putting on/Taking off footwear   What is the patient wearing?: Non-skid slipper socks Assist for footwear: Supervision/Verbal cueing       Care Tool Toileting Toileting activity Toileting Activity did not occur (Clothing management and hygiene only): N/A (no void or bm)       Care Tool Bed Mobility Roll left and right activity   Roll left and right assist level: Contact Guard/Touching assist    Sit to lying activity   Sit to lying assist level: Contact Guard/Touching assist    Lying to sitting edge of bed activity   Lying to sitting edge of bed assist level: Contact Guard/Touching assist     Care Tool Transfers Sit to stand transfer   Sit to stand assist level: Minimal Assistance - Patient > 75%    Chair/bed transfer   Chair/bed transfer assist level: Minimal Assistance - Patient > 75%     Toilet transfer   Assist Level: Minimal Assistance - Patient > 75%     Care Tool Cognition Expression of Ideas and Wants Expression of Ideas and Wants: Frequent difficulty - frequently exhibits difficulty with expressing needs and ideas   Understanding Verbal and Non-Verbal Content Understanding Verbal and Non-Verbal Content: Usually understands - understands most conversations, but misses some part/intent of message. Requires cues at times to understand   Memory/Recall Ability *first 3 days only Memory/Recall Ability *first 3 days only: Location of own room;That he or she is in a hospital/hospital unit    Refer to Care Plan for [place] 1 OT Short Term Goal 1 (Week 1): Pt will complete toilet transfer with CGA OT Short Term Goal 2 (Week 1): Pt will correctly orient his shirt without cues and with 1st presentation OT Short Term Goal 3 (Week 1): Pt will complete LB dressing with Min A sit<stand  Recommendations for other services: None    Skilled  Therapeutic Intervention Skilled OT session completed with focus on initial evaluation, education on OT role/POC, and establishment of patient-centered goals.   [name] in the recliner, reporting that he had a HA and already asked the RN for pain medicine. RN provided pt with pain medicine during session. Pt then used the RW to ambulate to a chair placed in front  of sink with CGA-Min A. Pt asking OT if he needed to sit in 1 chair or the chair beside it. Pt completed most of the ADL in standing per preference. His expressive aphasia was visibly frustrating so we communicated mostly by yes/no which he did well with. Note that he did try to thread his shirt over his leg like pants and needed cues to recognize this. Pt able to locate needed ADL items placed Rt/Lt on the sink during oral care/grooming tasks given increased time, states that he has been legally blind for over 20 years but can see shapes/colors. When his pant leg was inside out, OT offered to help however pt declined help and was able to self problem solve given increased time. He doffed clothing afterwards due to preferring to wear a hospital gown to bed. Note that throughout session pt used his Rt hand at the nondominant level though reports being Rt hand dominant at baseline. Ambulatory transfer to bed completed using RW with CGA, pt making x2 laps around the room before transferring to bed. He remained in bed at close of session, all needs within reach and bed alarm set.   ADL ADL Eating: Not assessed Grooming: Contact guard Where Assessed-Grooming: Standing at sink Upper Body Bathing: Contact guard Where Assessed-Upper Body Bathing: Standing at sink Lower Body Bathing: Contact guard Where Assessed-Lower Body Bathing: Sitting at sink;Standing at sink Upper Body Dressing: Contact guard Where Assessed-Upper Body Dressing: Standing at sink Lower Body Dressing: Moderate assistance Where Assessed-Lower Body Dressing: Standing at  sink;Sitting at sink Toileting: Not assessed Tub/Shower Transfer: Not assessed   Discharge Criteria: Patient will be discharged from OT if patient refuses treatment 3 consecutive times without medical reason, if treatment goals not met, if there is a change in medical status, if patient makes no progress towards goals or if patient is discharged from hospital.  The above assessment, treatment plan, treatment alternatives and goals were discussed and mutually agreed upon: by patient  Skeet Simmer 02/02/2021, 4:02 PM

## 2021-02-02 NOTE — Progress Notes (Signed)
Inpatient [facility] [place] Individual Statement of Services  Patient Name:  Douglas Walker  Date:  02/02/2021  Welcome to the [facility].  Our goal is to provide you with an individualized program based on your diagnosis and situation, designed to meet your specific needs.  With this comprehensive rehabilitation program, you will be expected to participate in at least 3 hours of rehabilitation therapies Monday-Friday, with modified therapy programming on the weekends.  Your rehabilitation program will include the following services:  Physical Therapy (PT), Occupational Therapy (OT), Speech Therapy (ST), 24 hour per day rehabilitation nursing, Therapeutic Recreaction (TR), Neuropsychology, Care Coordinator, Rehabilitation Medicine, Nutrition Services, Pharmacy Services and Other  Weekly team conferences will be held on Tuesday to discuss your progress.  Your Inpatient Rehabilitation Care Coordinator will talk with you frequently to get your input and to update you on team discussions.  Team conferences with you and your family in attendance may also be held.  Expected length of stay: 8-12 Days  Overall anticipated outcome: Supervision to Min A  Depending on your progress and recovery, your program may change. Your Inpatient Rehabilitation Care Coordinator will coordinate services and will keep you informed of any changes. Your Inpatient Rehabilitation Care Coordinator's name and contact numbers are listed  below.  The following services may also be recommended but are not provided by the [facility]:    [place] will be made to provide these services after discharge if needed.  Arrangements include referral to agencies that provide these services.  Your insurance has been verified to be:  Medicare A & B Your primary doctor is:  Josetta Huddle, MD  Pertinent  information will be shared with your doctor and your insurance company.  Inpatient Rehabilitation Care Coordinator:  Erlene Quan, [place] or [phone]  Information discussed with and copy given to patient by: [name], 02/02/2021, 11:42 AM

## 2021-02-02 NOTE — Evaluation (Signed)
Speech Language Pathology Assessment and Plan  Patient Details  Name: Douglas Walker MRN: [id number] Date of Birth: [birth year]  SLP Diagnosis: Cognitive Impairments;Aphasia;Speech and Language deficits;Dysphagia  Rehab Potential: Good ELOS: 10-14 days    Today's Date: 02/02/2021 SLP Individual Time: 0932-6712 SLP Individual Time Calculation (min): 58 min   Hospital Problem: Principal Problem:   Left middle cerebral artery stroke [facility])  Past Medical History:  Past Medical History:  Diagnosis Date  . Arthritis    shoulders and back  . Cancer ([place])    skin cancers  . Chronic low back pain 12/15/2017  . CKD (chronic kidney disease) stage 3, GFR 30-59 ml/min (HCC)   . Coronary artery disease   . Dysrhythmia     Paroxysmal atrial fibrillation; Tachycardia-bradycardia syndrome  . Essential tremor 02/28/2016  . GERD (gastroesophageal reflux disease)   . Hernia   . History of hiatal hernia   . Hyperlipemia   . Hypertension   . Macular degeneration   . Neuromuscular disorder (HCC)    neuropathy  . Paroxysmal atrial fibrillation ([place]) 10/28/2013  . Presence of permanent cardiac pacemaker 12/04/2018  . Tachycardia-bradycardia syndrome ([place]) 09/13/2014  . TIA (transient ischemic attack)    Past Surgical History:  Past Surgical History:  Procedure Laterality Date  . APPENDECTOMY    . EYE SURGERY    . HERNIA REPAIR    . HERNIA REPAIR    . INSERT / REPLACE / REMOVE PACEMAKER  12/04/2018  . IRRIGATION AND DEBRIDEMENT ABSCESS Right 07/24/2020   Procedure: IRRIGATION AND DEBRIDEMENT HEMATOMA;  Surgeon: Coralie Keens, MD;  Location: [place];  Service: General;  Laterality: Right;  . MASS EXCISION Right 07/20/2020   Procedure: EXCISION OF RIGHT CHEST WALL MASS;  Surgeon: Stark Klein, MD;  Location: [place];  Service: General;  Laterality: Right;  . PACEMAKER IMPLANT N/A 12/04/2018   Procedure: PACEMAKER IMPLANT;  Surgeon: Evans Lance, MD;  Location: [place] CV [facility];  Service:  Cardiovascular;  Laterality: N/A;    Assessment / Plan / Recommendation Clinical Impression   HPI: Douglas Walker is a 87 year old right-handed male with history of ESBL 07/2020, essential tremor maintained on primidone, chronic kidney disease stage III, legally blind, diastolic congestive heart failure, CAD, PAF on Coumadin in the past but discontinued due to hematoma on his back, hypertension, hyperlipidemia, TIA, tachycardia syndrome with permanent pacemaker.  Per chart review patient lives with his son 1 level home.  Family assistance as needed.  Uses a rolling walker at baseline.  He attends do his own ADLs independently.  Presented 01/28/2021 with right facial droop and aphasia of acute onset.  Cranial CT scan negative for acute changes.  CT angiogram of head and neck no emergent large vessel occlusion.  Left M3/4 branch occlusion with 25 cc of ischemic range cerebral perfusion.  Echocardiogram with ejection fraction of 50 to 55% no wall motion abnormalities.  Admission chemistries unremarkable except INR 1.1, glucose 105 BUN 34 creatinine 1.54 lactic acid 1.0 ammonia 21, urine drug screen positive barbiturates, SARS coronavirus negative.  Currently maintained on Eliquis for CVA prophylaxis.    Dysphagia #1 nectar thick liquid diet.  Due to patient's decrease in functional mobility he was admitted for a comprehensive rehab program 02/01/21 and SLP evaluations were administered 02/02/21 with results as follows:  Pt presents with moderate but improving mostly expressive aphasia. Pt is also hard of hearing, but his hearing aids were donned and in working order throughout evaluation. Although verbal output was very  limited initially, as pt began to arouse and engage more in activities in increased significantly. Verbal perseveration on words and phrases as well as effortful speech noted, although he did demonstrate ability to communicate at the sentence level. He was oriented to self, place, and month, but  required cueing for orientation to situation. Pt's son at bedside reports pt has significant macular degeneration, therefore, confrontation naming opportunities were limited, although he did name 3/3 items accurately with extra time. Divergent naming was [facility], convergent ~90% accurate with Moderate semantic and phonemic cueing, responsive naming also 90% accuracy with Min A cues. His speech was fully intelligible without evidence of dysarthria. Pt sustained his attention well in a quiet environment but required some cueing when more distractions present. He also demonstrated difficulty executing some 1-step directions during oral mechanism exam.  Pt also presents with s/sx moderate oral and pharyngeal dysphagia. His oral and lingual manipulation of puree boluses was generally minimal and weak in nature, however full oral clearance was achieved. After oral care, he accepted trials of ice chips and tsp of thin H2O without overt s/sx aspiration, although upon palpation, hyoid movement occurred ~2-5 seconds after PO presented with tsp of thin. Self-fed cup sips of H2O resulted in cough response in 50% of trials. Pt consumed 3 oz nectar thick water (self-fed) without any overt s/sx aspiration. Would recommend pt continue current dys 1 (puree) texture diet with nectar thick liquids, full supervision and may need assist with some self feeding. Medications should be crushed in puree.   Recommend pt receive skilled ST services to address speech/language, cognitive, and swallowing deficits in order to ensure diet safety and efficiency and maximize his functional communication, safety, and independence prior to discharge.     Skilled Therapeutic Interventions          Bedside swallow and cognitive-linguistic evaluations were administered and results were reviewed with pt (please see above for details regarding the results).   SLP Assessment  Patient will need skilled Speech Lanaguage Pathology Services during CIR  admission    Recommendations  SLP Diet Recommendations: Nectar;Dysphagia 1 (Puree) Liquid Administration via: Cup Medication Administration: Crushed with puree Supervision: Full supervision/cueing for compensatory strategies;Staff to assist with self feeding;Patient able to self feed Compensations: Small sips/bites;Minimize environmental distractions;Slow rate Postural Changes and/or Swallow Maneuvers: Seated upright 90 degrees;Upright 30-60 min after meal Oral Care Recommendations: Oral care BID Patient destination: Home Follow up Recommendations: Home Health SLP;24 hour supervision/assistance Equipment Recommended: None recommended by SLP    SLP Frequency 3 to 5 out of 7 days   SLP Duration  SLP Intensity  SLP Treatment/Interventions 10-14 days  Minumum of 1-2 x/day, 30 to 90 minutes  Functional tasks;Dysphagia/aspiration precaution training;Cueing hierarchy;Cognitive remediation/compensation;Speech/Language facilitation;Therapeutic Activities;Patient/family education;Internal/external aids    Pain Pain Assessment Pain Scale: Faces Faces Pain Scale: No hurt   Prior Functioning Type of Home: House  Lives With: Son Available Help at Discharge: Family;Available 24 hours/day  SLP Evaluation Cognition Overall Cognitive Status: Impaired/Different from baseline Arousal/Alertness: Awake/alert Orientation Level: Disoriented to person;Oriented to situation;Oriented to person;Disoriented to place Attention: Focused;Sustained Focused Attention: Impaired Focused Attention Impairment: Functional basic;Verbal basic Sustained Attention: Impaired Sustained Attention Impairment: Verbal basic;Functional basic Selective Attention: Impaired Selective Attention Impairment: Verbal basic Memory:  (needs further assessment) Awareness: Impaired Awareness Impairment: Emergent impairment Problem Solving: Impaired Problem Solving Impairment: Verbal complex;Functional complex Sequencing:  Appears intact Sequencing Impairment: Verbal basic Organizing: Impaired Organizing Impairment: Functional basic Safety/Judgment: Impaired  Comprehension Auditory Comprehension Overall Auditory Comprehension: Impaired Yes/No Questions:  Within Functional Limits Commands: Impaired Two Step Basic Commands: 50-74% accurate Conversation: Complex Interfering Components: Hearing;Processing speed;Attention EffectiveTechniques: Extra processing time;Increased volume;Slowed speech;Repetition;Visual/Gestural cues Visual Recognition/Discrimination Discrimination: Not tested Reading Comprehension Reading Status: Not tested (pt has macular degeneration) Expression Expression Primary Mode of Expression: Verbal Verbal Expression Overall Verbal Expression: Impaired Initiation: Impaired Level of Generative/Spontaneous Verbalization: Sentence Repetition: No impairment Naming: Impairment Responsive: 76-100% accurate Confrontation: Within functional limits Convergent: 75-100% accurate Divergent: 75-100% accurate Other Naming Comments: verbal perseveration, effortful speech Verbal Errors: Perseveration Pragmatics: No impairment Interfering Components: Attention Effective Techniques: Phonemic cues;Sentence completion;Semantic cues Non-Verbal Means of Communication: Not applicable Written Expression Dominant Hand: Right Written Expression: Not tested Oral Motor Oral Motor/Sensory Function Overall Oral Motor/Sensory Function: Mild impairment Facial ROM: Reduced right;Reduced left Facial Symmetry: Within Functional Limits Lingual ROM: Reduced right;Reduced left Lingual Symmetry: Within Functional Limits Lingual Strength: Reduced Velum: Within Functional Limits Mandible: Within Functional Limits Motor Speech Overall Motor Speech: Appears within functional limits for tasks assessed Intelligibility: Intelligible Motor Speech Errors: Not applicable  Care Tool Care Tool Cognition Expression  of Ideas and Wants Expression of Ideas and Wants: Frequent difficulty - frequently exhibits difficulty with expressing needs and ideas   Understanding Verbal and Non-Verbal Content Understanding Verbal and Non-Verbal Content: Usually understands - understands most conversations, but misses some part/intent of message. Requires cues at times to understand   Memory/Recall Ability *first 3 days only Memory/Recall Ability *first 3 days only: Location of own room;That he or she is in a hospital/hospital unit      Intelligibility: Intelligible  Bedside Swallowing Assessment General Date of Onset: 01/28/21 Previous Swallow Assessment: BSE 1/30 Diet Prior to this Study: Dysphagia 1 (puree);Nectar-thick liquids Temperature Spikes Noted: No Respiratory Status: Room air Behavior/Cognition: Alert;Cooperative;Requires cueing Self-Feeding Abilities: Needs assist;Able to feed self Vision: Functional for self-feeding Patient Positioning: Upright in bed Baseline Vocal Quality: Normal Volitional Cough: Weak Volitional Swallow: Unable to elicit  Oral Care Assessment Does patient have any of the following "high(er) risk" factors?: None of the above Does patient have any of the following "at risk" factors?: Other - dysphagia;Saliva - thick, dry mouth;Tongue - coated;Diet - patient on thickened liquids Patient is AT RISK: Order set for Adult Oral Care Protocol initiated -  "At Risk Patients" option selected (see row information) Patient is LOW RISK: Follow universal precautions (see row information) Ice Chips Ice chips: Within functional limits Presentation: Spoon Thin Liquid Thin Liquid: Impaired Presentation: Self Fed;Spoon;Cup Oral Phase Impairments: Reduced lingual movement/coordination Pharyngeal  Phase Impairments: Multiple swallows;Cough - Immediate Nectar Thick Nectar Thick Liquid: Within functional limits Presentation: Self Fed;Cup Honey Thick Honey Thick Liquid: Not  tested Puree Puree: Impaired Presentation: Spoon;Self Fed Oral Phase Impairments: Reduced lingual movement/coordination Solid Solid: Not tested BSE Assessment Risk for Aspiration Impact on safety and function: Mild aspiration risk Other Related Risk Factors: Cognitive impairment  Short Term Goals: Week 1: SLP Short Term Goal 1 (Week 1): Pt will consume therapeutic trials of thin H2O with minimal overt s/sx X2 prior to MBS. SLP Short Term Goal 2 (Week 1): Pt will consume trials of dysphagia 2 (minced/ground) solids with efficienct mastication and oral clearance X2 prior to advancement. SLP Short Term Goal 3 (Week 1): Pt will demonstrate ability to express his thoughts and needs with overall Min A cues for use of strategies for word finding and fluency. SLP Short Term Goal 4 (Week 1): Pt will detect and correct verbal errors (including perseverations) with Min A cues. SLP Short Term Goal 5 (Week 1):  Pt will selectively attend to functional tasks for 5 minute intervals with Min A cues for redirection.  Refer to Care Plan for Long Term Goals  Recommendations for other services: None   Discharge Criteria: Patient will be discharged from SLP if patient refuses treatment 3 consecutive times without medical reason, if treatment goals not met, if there is a change in medical status, if patient makes no progress towards goals or if patient is discharged from hospital.  The above assessment, treatment plan, treatment alternatives and goals were discussed and mutually agreed upon: by patient and his son  Arbutus Leas 02/02/2021, 12:30 PM

## 2021-02-02 NOTE — Progress Notes (Signed)
Inpatient Rehabilitation Care Coordinator Assessment and Plan Patient Details  Name: Douglas Walker MRN: [id number] Date of Birth: January 14, 1925  Today's Date: 02/02/2021  Hospital Problems: Principal Problem:   Left middle cerebral artery stroke [facility])  Past Medical History:  Past Medical History:  Diagnosis Date  . Arthritis    shoulders and back  . Cancer ([place])    skin cancers  . Chronic low back pain 12/15/2017  . CKD (chronic kidney disease) stage 3, GFR 30-59 ml/min (HCC)   . Coronary artery disease   . Dysrhythmia     Paroxysmal atrial fibrillation; Tachycardia-bradycardia syndrome  . Essential tremor 02/28/2016  . GERD (gastroesophageal reflux disease)   . Hernia   . History of hiatal hernia   . Hyperlipemia   . Hypertension   . Macular degeneration   . Neuromuscular disorder (HCC)    neuropathy  . Paroxysmal atrial fibrillation ([place]) 10/28/2013  . Presence of permanent cardiac pacemaker 12/04/2018  . Tachycardia-bradycardia syndrome ([place]) 09/13/2014  . TIA (transient ischemic attack)    Past Surgical History:  Past Surgical History:  Procedure Laterality Date  . APPENDECTOMY    . EYE SURGERY    . HERNIA REPAIR    . HERNIA REPAIR    . INSERT / REPLACE / REMOVE PACEMAKER  12/04/2018  . IRRIGATION AND DEBRIDEMENT ABSCESS Right 07/24/2020   Procedure: IRRIGATION AND DEBRIDEMENT HEMATOMA;  Surgeon: Coralie Keens, MD;  Location: [place];  Service: General;  Laterality: Right;  . MASS EXCISION Right 07/20/2020   Procedure: EXCISION OF RIGHT CHEST WALL MASS;  Surgeon: Stark Klein, MD;  Location: [place];  Service: General;  Laterality: Right;  . PACEMAKER IMPLANT N/A 12/04/2018   Procedure: PACEMAKER IMPLANT;  Surgeon: Evans Lance, MD;  Location: [place] [facility];  Service: Cardiovascular;  Laterality: N/A;   Social History:  reports that he quit smoking about 58 years ago. He has never used smokeless tobacco. He reports that he does not drink alcohol and does not  use drugs.  Family / Support Systems Marital Status: Widow/Widower Patient Roles: Spouse Children: Douglas Walker (Son), Designer, jewellery ([place]) Other Supports: Douglas-in-Walker, grandchildren Anticipated Caregiver: Douglas Walker (son) Ability/Limitations of Caregiver: NONE Caregiver Availability: 24/7  Social History Preferred language: English Religion: Baptist Read: Yes Write: Yes Employment Status: Disabled Public relations account executive Issues: n/a Guardian/Conservator: Douglas Walker and Douglas Walker   Abuse/Neglect Abuse/Neglect Assessment Can Be Completed: Yes Physical Abuse: Denies Verbal Abuse: Denies Sexual Abuse: Denies Exploitation of patient/patient's resources: Denies Self-Neglect: Denies  Emotional Status Pt's affect, behavior and adjustment status: no Recent Psychosocial Issues: no Psychiatric History: no Substance Abuse History: no  Patient / Family Perceptions, Expectations & Goals Pt/Family understanding of illness & functional limitations: yes Premorbid pt/family roles/activities: MOD I W/ household mobilility W/RW. MOD all mobility and ADLS, medication assistance (pillbox) and cooking assistance from son Anticipated changes in roles/activities/participation: Some assitance Pt/family expectations/goals: Supervision to [employer]: None Premorbid Home Care/DME Agencies: Other (Comment) (Shower seat, gran bars (tub and shower), [place], [place], [employer], [employer], [employer]) Transportation available at discharge: Family able to transport  Discharge Planning Living Arrangements: Children (Lives with son) Support Systems: Children Type of Residence: Private residence (1 level home, entry level) Administrator, sports: Chartered certified accountant Resources: Family Training and development officer Screen Referred: No Living Expenses: Lives with family Money Management: Patient,Family Does the patient have any problems obtaining your medications?: No Home Management:  Medication assistance from son Patient/Family Preliminary Plans: Will continue Care Coordinator Anticipated Follow  Up Needs: HH/OP DC Planning Additional Notes/Comments: Patient legally blind in L eye, patient reports "yes", even when he means "no" Expected length of stay: 8-12 Days  Clinical Impression SW met with patient. Called patient son at bedside. Introduced self, explained role and addressed questions and concerns. Son will be primary contact, son spouse is patient primary caregiver. Patient will discharge back home with son and Douglas Walker. 1 level home, no steps to enter. Sw will continue to follow up with questions and concerns.   Douglas Walker 02/02/2021, 12:35 PM

## 2021-02-02 NOTE — Progress Notes (Signed)
[place] PHYSICAL MEDICINE & REHABILITATION PROGRESS NOTE   Subjective/Complaints:   IV looks irritated.  Per staff, slept OK.  Still appears to be having apnea episodes when I listen to him snore/stop breathing for a few seconds, then restarts.    Did eat >50% of breakfast.    ROS: Limited due to aphasia/cognition  Objective:   No results found. Recent Labs    02/01/21 0351 02/02/21 0527  WBC 7.5 6.5  HGB 10.9* 10.6*  HCT 33.3* 31.3*  PLT 98* 100*   Recent Labs    02/01/21 0351 02/02/21 0527  NA 143 143  K 3.7 3.6  CL 109 109  CO2 23 25  GLUCOSE 99 126*  BUN 28* 27*  CREATININE 1.43* 1.37*  CALCIUM 8.8* 8.8*    Intake/Output Summary (Last 24 hours) at 02/02/2021 1130 Last data filed at 02/02/2021 0853 Gross per 24 hour  Intake 408 ml  Output -  Net 408 ml        Physical Exam: Vital Signs Blood pressure (!) 148/66, pulse 70, temperature 97.9 F (36.6 C), resp. rate 20, height 5\' 9"  (1.753 m), weight 90.8 kg, SpO2 97 %.  Constitutional:  asleep, woke slowly- had 1 more apneic episode I saw- but not on pulse ox, so don't know if sats dropped. Supine in bed, NAD  HENT:  Edentulous; snoring intermittently- dry mouth; trembling/tremors in jaw Cardiovascular: irregular rhythm, rate controlled Pulmonary: CTA B/L- no W/R/R- good air movement Abdominal: Soft, NT, ND, (+)BS  Musculoskeletal:     Cervical back: Normal range of motion and neck supple.     Comments: At least 3/5 in RUE and RLE was moving- lifted off bed some- couldn't get him to move much, so sleepy- also RUE tremors WITH movement LUE- strong- likely 5-/5 LLE- good DF and PF 5-/5- but couldn't get to test higher due to sleepiness  Skin:    Comments: ecchymoses in arms B/L L forearm IV- looks a little red/irritated Neurological:     Comments: Patient is alert and hard of hearing.  Makes eye contact with examiner but visually impaired.  Right facial tics/tremor and facial droop.  He is  aphasic.  Moderately to severely aphasic- only said a few words Cannot assess sensation due to aphasia and sedation Hard of hearing- says 3 phrases total- unable to say more to express himself  Psychiatric: very sleepy    Assessment/Plan: 1. Functional deficits which require 3+ hours per day of interdisciplinary therapy in a comprehensive inpatient rehab setting.  Physiatrist is providing close team supervision and 24 hour management of active medical problems listed below.  Physiatrist and rehab team continue to assess barriers to discharge/monitor patient progress toward functional and medical goals  Care Tool:  Bathing              Bathing assist       Upper Body Dressing/Undressing Upper body dressing        Upper body assist      Lower Body Dressing/Undressing Lower body dressing            Lower body assist       Toileting Toileting    Toileting assist       Transfers Chair/bed transfer  Transfers assist           Locomotion Ambulation   Ambulation assist              Walk 10 feet activity   Assist  Walk 50 feet activity   Assist           Walk 150 feet activity   Assist           Walk 10 feet on uneven surface  activity   Assist           Wheelchair     Assist               Wheelchair 50 feet with 2 turns activity    Assist            Wheelchair 150 feet activity     Assist          Blood pressure (!) 148/66, pulse 70, temperature 97.9 F (36.6 C), resp. rate 20, height 5\' 9"  (1.753 m), weight 90.8 kg, SpO2 97 %.    Medical Problem List and Plan: 1.  Right hemiparesis and aphasia secondary to left cortical infarct/embolic left MCA             -patient may  shower             -ELOS/Goals:78-12 days- min A to supervision 2.  Antithrombotics: -DVT/anticoagulation: Eliquis             -antiplatelet therapy: N/A 3. Pain Management: Topamax 50 mg twice  daily, Lyrica 25 mg nightly, Cymbalta 20 mg daily- c/o sides hurting- will add tramadol prn for severe pain  2/4- didn't complain of pain today- con't regimen  4. Mood: Provide emotional support             -antipsychotic agents: N/A 5. Neuropsych: This patient is capable of making decisions on his own behalf. 6. Skin/Wound Care: Routine skin checks  2/4- IV looks irritated- will d/c.  7. Fluids/Electrolytes/Nutrition: Routine in and outs with follow-up chemistries 8.  PAF/pacemaker.  Cardiac rate controlled.  Continue Eliquis 9.  CKD stage IIIB  Follow-up chemistries  2/4- Cr 1.37- stable to slightly better- con't reigmen 10.  Diastolic congestive heart failure.  Patient on Lasix 60 mg daily prior to admission and has been on hold since admission.  Resume as needed monitor for any signs of fluid overload Filed Weights   02/01/21 1608  Weight: 90.8 kg    2/4- weight 90.8- kg- will monitor daily.  11.  Essential tremor.  Mysoline 250 mg twice daily- mainly RUE and jaw  2/4- looks a litlte better this AM- on scheduled meds- con't regimen 12.  BPH.  Flomax 0.4 mg twice daily, Proscar 5 mg daily 13.  Legally blind.  Patient independent ADLs prior to admission 14.  Hypothyroidism.  Synthroid 15.  History of ESBL 07/2020.  Contact precautions for life per hospital policy.  16. Apnea episodes  2/4- dropped sats to 84% during apnea episode yesterday- still having apnea- will try and get CPAP for pt- hopefully would wear.    LOS: 1 days A FACE TO FACE EVALUATION WAS PERFORMED  Asuzena Weis 02/02/2021, 11:30 AM

## 2021-02-02 NOTE — Evaluation (Signed)
Physical Therapy Assessment and Plan  Patient Details  Name: Douglas Walker MRN: [id number] Date of Birth: January 23, 1925  PT Diagnosis: Abnormal posture, Abnormality of gait, Difficulty walking, Impaired cognition and Muscle weakness Rehab Potential: Good ELOS: 10-14 days   Today's Date: 02/02/2021 PT Individual Time: 1027-1120 PT Individual Time Calculation (min): 53 min    Hospital Problem: Principal Problem:   Left middle cerebral artery stroke [facility])   Past Medical History:  Past Medical History:  Diagnosis Date  . Arthritis    shoulders and back  . Cancer ([place])    skin cancers  . Chronic low back pain 12/15/2017  . CKD (chronic kidney disease) stage 3, GFR 30-59 ml/min (HCC)   . Coronary artery disease   . Dysrhythmia     Paroxysmal atrial fibrillation; Tachycardia-bradycardia syndrome  . Essential tremor 02/28/2016  . GERD (gastroesophageal reflux disease)   . Hernia   . History of hiatal hernia   . Hyperlipemia   . Hypertension   . Macular degeneration   . Neuromuscular disorder (HCC)    neuropathy  . Paroxysmal atrial fibrillation ([place]) 10/28/2013  . Presence of permanent cardiac pacemaker 12/04/2018  . Tachycardia-bradycardia syndrome ([place]) 09/13/2014  . TIA (transient ischemic attack)    Past Surgical History:  Past Surgical History:  Procedure Laterality Date  . APPENDECTOMY    . EYE SURGERY    . HERNIA REPAIR    . HERNIA REPAIR    . INSERT / REPLACE / REMOVE PACEMAKER  12/04/2018  . IRRIGATION AND DEBRIDEMENT ABSCESS Right 07/24/2020   Procedure: IRRIGATION AND DEBRIDEMENT HEMATOMA;  Surgeon: Coralie Keens, MD;  Location: [place];  Service: General;  Laterality: Right;  . MASS EXCISION Right 07/20/2020   Procedure: EXCISION OF RIGHT CHEST WALL MASS;  Surgeon: Stark Klein, MD;  Location: [place];  Service: General;  Laterality: Right;  . PACEMAKER IMPLANT N/A 12/04/2018   Procedure: PACEMAKER IMPLANT;  Surgeon: Evans Lance, MD;  Location: [place]  CV LAB;  Service: Cardiovascular;  Laterality: N/A;    Assessment & Plan Clinical Impression: Patient is a 87 y.o. year old male with history of ESBL 07/2020, essential tremor maintained on primidone, chronic kidney disease stage III, legally blind, diastolic congestive heart failure, CAD, PAF on Coumadin in the past but discontinued due to hematoma on his back, hypertension, hyperlipidemia, TIA, tachycardia syndrome with permanent pacemaker.  Per chart review patient lives with his son 1 level home.  Family assistance as needed.  Uses a rolling walker at baseline.  He attends do his own ADLs independently.  Presented 01/28/2021 with right facial droop and aphasia of acute onset.  Cranial CT scan negative for acute changes.  CT angiogram of head and neck no emergent large vessel occlusion.  Left M3/4 branch occlusion with 25 cc of ischemic range cerebral perfusion.  Echocardiogram with ejection fraction of 50 to 55% no wall motion abnormalities.  Admission chemistries unremarkable except INR 1.1, glucose 105 BUN 34 creatinine 1.54 lactic acid 1.0 ammonia 21, urine drug screen positive barbiturates, SARS coronavirus negative.  Currently maintained on Eliquis for CVA prophylaxis.    Dysphagia #1 nectar thick liquid diet.  Due to patient's decrease in functional mobility he was admitted for a comprehensive rehab program.  Patient transferred to CIR on 02/01/2021 .   Patient currently requires min with mobility secondary to muscle weakness, decreased cardiorespiratoy endurance and decreased sitting balance, decreased standing balance, decreased postural control and decreased balance strategies.  Prior to hospitalization, patient was  supervision with mobility and lived with Son in a House home.  Home access is  Level entry.  Patient will benefit from skilled PT intervention to maximize safe functional mobility, minimize fall risk and decrease caregiver burden for planned discharge home with 24 hour supervision.   Anticipate patient will benefit from follow up [place] at discharge.  PT - End of Session Activity Tolerance: Tolerates 30+ min activity with multiple rests Endurance Deficit: Yes PT Assessment Rehab Potential (ACUTE/IP ONLY): Good PT Barriers to Discharge: Home environment access/layout PT Barriers to Discharge Comments: low vision, mild cognitive impairments PT Patient demonstrates impairments in the following area(s): Behavior;Balance;Safety;Skin Integrity;Endurance;Pain PT Transfers Functional Problem(s): Furniture;Car;Bed to Chair;Bed Mobility PT Locomotion Functional Problem(s): Wheelchair Mobility;Ambulation PT Plan PT Intensity: Minimum of 1-2 x/day ,45 to 90 minutes PT Frequency: 5 out of 7 days PT Duration Estimated Length of Stay: 10-14 days PT Treatment/Interventions: Ambulation/gait training;Community reintegration;DME/adaptive equipment instruction;Neuromuscular re-education;Psychosocial support;Stair training;UE/LE Strength taining/ROM;Wheelchair propulsion/positioning;UE/LE Coordination activities;Therapeutic Activities;Skin care/wound management;Pain management;Discharge planning;Balance/vestibular training;Functional electrical stimulation;Cognitive remediation/compensation;Disease management/prevention;Functional mobility training;Patient/family education;Splinting/orthotics;Therapeutic Exercise;Visual/perceptual remediation/compensation PT Transfers Anticipated Outcome(s): CGA PT Locomotion Anticipated Outcome(s): CGA PT Recommendation Patient destination: Home Equipment Recommended: To be determined Equipment Details: has RW   PT Evaluation Precautions/Restrictions Precautions Precautions: Fall Precaution Comments: Aphasic, HOH, legally blind in L eye. Restrictions Weight Bearing Restrictions: No General   Vital Signs Pain Pain Assessment Pain Scale: 0-10 Pain Score: 5  Pain Type: Acute pain Pain Location: Back Pain Onset: On-going Pain Intervention(s):  Repositioned;RN made aware Home Living/Prior Functioning Home Living Living Arrangements: Children (Lives with son) Available Help at Discharge: Family;Available 24 hours/day Type of Home: House Home Access: Level entry Home Layout: One level Bathroom Shower/Tub: Multimedia programmer: Handicapped height Bathroom Accessibility: Yes  Lives With: Son Prior Function Level of Independence: Requires assistive device for independence;Independent with gait;Independent with basic ADLs;Independent with transfers;Other (comment) (family assisted with meals, housework, appointments)  Able to Take Stairs?: No Driving: No Comments: walker walker Vision/Perception     Cognition Overall Cognitive Status: Impaired/Different from baseline Arousal/Alertness: Awake/alert Orientation Level: Disoriented to person;Oriented to situation;Oriented to person;Disoriented to place Attention: Focused;Sustained Focused Attention: Impaired Focused Attention Impairment: Functional basic;Verbal basic Sustained Attention: Impaired Sustained Attention Impairment: Verbal basic;Functional basic Selective Attention: Impaired Selective Attention Impairment: Verbal basic Memory:  (needs further assessment) Awareness: Impaired Awareness Impairment: Emergent impairment Problem Solving: Impaired Problem Solving Impairment: Verbal complex;Functional complex Sequencing: Appears intact Sequencing Impairment: Verbal basic Organizing: Impaired Organizing Impairment: Functional basic Safety/Judgment: Impaired Sensation Sensation Light Touch: Appears Intact Motor  Motor Motor: Within Functional Limits Motor - Skilled Clinical Observations: does have baseline essential tremor in R UE and at mouth   Trunk/Postural Assessment  Cervical Assessment Cervical Assessment:  (forward head carriage) Thoracic Assessment Thoracic Assessment:  (mildly kyphotic) Lumbar Assessment Lumbar Assessment:  (posterior pelvic  tilt)  Balance Balance Balance Assessed: Yes Static Sitting Balance Static Sitting - Balance Support: Feet supported Static Sitting - Level of Assistance: 5: Stand by assistance Dynamic Sitting Balance Dynamic Sitting - Balance Support: Feet supported Dynamic Sitting - Level of Assistance: 4: Min assist Dynamic Sitting - Balance Activities: Reaching across midline;Forward lean/weight shifting;Lateral lean/weight shifting Static Standing Balance Static Standing - Balance Support: Right upper extremity supported Static Standing - Level of Assistance: 4: Min assist Dynamic Standing Balance Dynamic Standing - Balance Support: Bilateral upper extremity supported Dynamic Standing - Level of Assistance: 4: Min assist Extremity Assessment      RLE Assessment RLE Assessment: Exceptions to [facility] RLE Strength Right  Hip Flexion: 4-/5 Right Hip ABduction: 4-/5 Right Hip ADduction: 4-/5 Right Knee Flexion: 4-/5 Right Knee Extension: 4-/5 Right Ankle Dorsiflexion: 4-/5 Right Ankle Plantar Flexion: 4-/5 LLE Assessment LLE Assessment: Exceptions to [facility] LLE Strength Left Hip Flexion: 4-/5 Left Hip ABduction: 4-/5 Left Hip ADduction: 4-/5 Left Knee Flexion: 4-/5 Left Knee Extension: 4-/5 Left Ankle Dorsiflexion: 4-/5 Left Ankle Plantar Flexion: 4-/5  Care Tool Care Tool Bed Mobility Roll left and right activity   Roll left and right assist level: Contact Guard/Touching assist    Sit to lying activity   Sit to lying assist level: Contact Guard/Touching assist    Lying to sitting edge of bed activity   Lying to sitting edge of bed assist level: Contact Guard/Touching assist     Care Tool Transfers Sit to stand transfer   Sit to stand assist level: Minimal Assistance - Patient > 75%    Chair/bed transfer   Chair/bed transfer assist level: Minimal Assistance - Patient > 75%     Toilet transfer   Assist Level: Minimal Assistance - Patient > 75%    Car transfer Car transfer  activity did not occur: Safety/medical concerns        Care Tool Locomotion Ambulation   Assist level: Minimal Assistance - Patient > 75% Assistive device: Walker-rolling Max distance: 150  Walk 10 feet activity   Assist level: Minimal Assistance - Patient > 75% Assistive device: Walker-rolling   Walk 50 feet with 2 turns activity   Assist level: Minimal Assistance - Patient > 75% Assistive device: Walker-rolling  Walk 150 feet activity   Assist level: Minimal Assistance - Patient > 75% Assistive device: Walker-rolling  Walk 10 feet on uneven surfaces activity   Assist level: Minimal Assistance - Patient > 75% Assistive device: Administrator, sports Stair activity did not occur: Safety/medical concerns        Walk up/down 1 step activity Walk up/down 1 step or curb (drop down) activity did not occur: Safety/medical concerns     Walk up/down 4 steps activity did not occuR: Safety/medical concerns  Walk up/down 4 steps activity      Walk up/down 12 steps activity Walk up/down 12 steps activity did not occur: Safety/medical concerns      Pick up small objects from floor Pick up small object from the floor (from standing position) activity did not occur: Safety/medical concerns      Wheelchair Will patient use wheelchair at discharge?: No          Wheel 50 feet with 2 turns activity      Wheel 150 feet activity        Refer to Care Plan for Long Term Goals  SHORT TERM GOAL WEEK 1 PT Short Term Goal 1 (Week 1): pt to demonstrate supervision supine<>sit PT Short Term Goal 2 (Week 1): pt to demonstrate CGA sit<>stand with LRAD PT Short Term Goal 3 (Week 1): pt to demonstrate gait with LRAD for 200' at [facility]  Recommendations for other services: None  Evaluation completed (see details above and below) with education on PT POC and goals and individual treatment initiated with focus on  Bed mobility, transfer training, gait training, safety awareness, call light use, DC  planning, and standing balance. pt received in bed and agreeable to therapy however very distractible as his back was hurting and itchy per pt and son. PT assisted in washing back, total A at pt request and applying lotion for nursing until nursing discusses with MD. Pt reported  this felt better and he was agreeable to sitting EOB, once back more manageable pt demonstrated much better attention to task and following commands and appropriately answered questions. Pt directed in supine>sit from flat bed, CGA with extra time, no bed rails. Pt sat EOB CGA-SBA. Pt directed in Sit to stand x2 from EOB lowest height to Rolling walker min A for stability. Pt denied dizziness. Pt directed in gait training with Rolling walker min A grossly improving overall with increased distances 150' total with pt denying need of rest break and min A for stability and assistance with navigation 2/2 poor vision in new environment. Pt agreeable to transferring to recliner at end of session. Pt left with son, in recliner, alarm set, All needs in reach and in good condition. Call light in hand.  Pt also setup with call light in hand with extra time spent on hitting button for assistance, pt knew to do this but just needed to be set up to complete task with low vision. Pt and son denied further questions or needs and nursing updated on pt's status and location.     Skilled Therapeutic Intervention Mobility Bed Mobility Bed Mobility: Rolling Right;Rolling Left;Supine to Sit;Sit to Supine;Sitting - Scoot to [employer] of Bed Rolling Right: Contact Guard/Touching assist Rolling Left: Contact Guard/Touching assist Supine to Sit: Contact Guard/Touching assist Sitting - Scoot to Edge of Bed: Contact Guard/Touching assist Sit to Supine: Contact Guard/Touching assist Transfers Transfers: Sit to Stand;Stand Pivot Transfers Sit to Stand: Minimal Assistance - Patient > 75% Stand Pivot Transfers: Minimal Assistance - Patient > 75% Stand Pivot  Transfer Details: Verbal cues for technique;Verbal cues for safe use of DME/AE;Verbal cues for precautions/safety;Verbal cues for sequencing;Verbal cues for gait pattern Transfer (Assistive device): Rolling walker Locomotion  Gait Ambulation: Yes Gait Assistance: Minimal Assistance - Patient > 75% Gait Distance (Feet): 150 Feet Assistive device: Rolling walker Gait Assistance Details: Verbal cues for sequencing;Verbal cues for gait pattern;Verbal cues for technique;Verbal cues for safe use of DME/AE;Verbal cues for precautions/safety Gait Assistance Details: limited in Independence in new environemtn 2/2 poor vision Gait Gait: Yes Gait Pattern: Impaired Gait Pattern: Trunk flexed;Step-to pattern;Decreased step length - left;Decreased stance time - right;Decreased stride length Gait velocity: reduced Stairs / Additional Locomotion Stairs: No Wheelchair Mobility Wheelchair Mobility: No   Discharge Criteria: Patient will be discharged from PT if patient refuses treatment 3 consecutive times without medical reason, if treatment goals not met, if there is a change in medical status, if patient makes no progress towards goals or if patient is discharged from hospital.  The above assessment, treatment plan, treatment alternatives and goals were discussed and mutually agreed upon: by patient and by family  [name] 02/02/2021, 12:20 PM

## 2021-02-02 NOTE — Plan of Care (Signed)
  Problem: Consults Goal: RH STROKE PATIENT EDUCATION Description: See Patient Education module for education specifics  Outcome: Progressing Goal: Nutrition Consult-if indicated Outcome: Progressing   Problem: RH BOWEL ELIMINATION Goal: RH STG MANAGE BOWEL WITH ASSISTANCE Description: STG Manage Bowel with supervision Assistance. Outcome: Progressing Goal: RH STG MANAGE BOWEL W/MEDICATION W/ASSISTANCE Description: STG Manage Bowel with Medication with supervision Assistance. Outcome: Progressing   Problem: RH BLADDER ELIMINATION Goal: RH STG MANAGE BLADDER WITH ASSISTANCE Description: STG Manage Bladder With supervision Assistance Outcome: Progressing   Problem: RH SKIN INTEGRITY Goal: RH STG MAINTAIN SKIN INTEGRITY WITH ASSISTANCE Description: STG Maintain Skin Integrity With supervision Assistance. Outcome: Progressing Goal: RH STG ABLE TO PERFORM INCISION/WOUND CARE W/ASSISTANCE Description: STG Able To Perform Incision/Wound Care With supervision Assistance. Outcome: Progressing   Problem: RH SAFETY Goal: RH STG ADHERE TO SAFETY PRECAUTIONS W/ASSISTANCE/DEVICE Description: STG Adhere to Safety Precautions With supervision Assistance/Device. Outcome: Progressing   Problem: RH PAIN MANAGEMENT Goal: RH STG PAIN MANAGED AT OR BELOW PT'S PAIN GOAL Description: <3 on a 0-10 pain scale. Outcome: Progressing   Problem: RH KNOWLEDGE DEFICIT Goal: RH STG INCREASE KNOWLEDGE OF DYSPHAGIA/FLUID INTAKE Description: Patient will demonstrate safe swallowing techniques of thickened liquids and dysphagia diets so staff can advance diet and liquids to next level. Outcome: Progressing Goal: RH STG INCREASE KNOWLEDGE OF STROKE PROPHYLAXIS Description: Patient will demonstrate knowledge of medications used to aid in prevention of future strokes with education materials and handouts provided by staff. Outcome: Progressing

## 2021-02-03 DIAGNOSIS — G894 Chronic pain syndrome: Secondary | ICD-10-CM

## 2021-02-03 DIAGNOSIS — I5032 Chronic diastolic (congestive) heart failure: Secondary | ICD-10-CM

## 2021-02-03 DIAGNOSIS — I69391 Dysphagia following cerebral infarction: Secondary | ICD-10-CM

## 2021-02-03 DIAGNOSIS — N1832 Chronic kidney disease, stage 3b: Secondary | ICD-10-CM

## 2021-02-03 DIAGNOSIS — G250 Essential tremor: Secondary | ICD-10-CM

## 2021-02-03 NOTE — Plan of Care (Signed)
  Problem: Consults Goal: RH STROKE PATIENT EDUCATION Description: See Patient Education module for education specifics  Outcome: Progressing Goal: Nutrition Consult-if indicated Outcome: Progressing   Problem: RH BOWEL ELIMINATION Goal: RH STG MANAGE BOWEL WITH ASSISTANCE Description: STG Manage Bowel with supervision Assistance. Outcome: Progressing Goal: RH STG MANAGE BOWEL W/MEDICATION W/ASSISTANCE Description: STG Manage Bowel with Medication with supervision Assistance. Outcome: Progressing   Problem: RH BLADDER ELIMINATION Goal: RH STG MANAGE BLADDER WITH ASSISTANCE Description: STG Manage Bladder With supervision Assistance Outcome: Progressing   Problem: RH SKIN INTEGRITY Goal: RH STG MAINTAIN SKIN INTEGRITY WITH ASSISTANCE Description: STG Maintain Skin Integrity With supervision Assistance. Outcome: Progressing Goal: RH STG ABLE TO PERFORM INCISION/WOUND CARE W/ASSISTANCE Description: STG Able To Perform Incision/Wound Care With supervision Assistance. Outcome: Progressing   Problem: RH SAFETY Goal: RH STG ADHERE TO SAFETY PRECAUTIONS W/ASSISTANCE/DEVICE Description: STG Adhere to Safety Precautions With supervision Assistance/Device. Outcome: Progressing   Problem: RH PAIN MANAGEMENT Goal: RH STG PAIN MANAGED AT OR BELOW PT'S PAIN GOAL Description: <3 on a 0-10 pain scale. Outcome: Progressing   Problem: RH KNOWLEDGE DEFICIT Goal: RH STG INCREASE KNOWLEDGE OF DYSPHAGIA/FLUID INTAKE Description: Patient will demonstrate safe swallowing techniques of thickened liquids and dysphagia diets so staff can advance diet and liquids to next level. Outcome: Progressing Goal: RH STG INCREASE KNOWLEDGE OF STROKE PROPHYLAXIS Description: Patient will demonstrate knowledge of medications used to aid in prevention of future strokes with education materials and handouts provided by staff. Outcome: Progressing   

## 2021-02-03 NOTE — Progress Notes (Signed)
Occupational Therapy Session Note  Patient Details  Name: Douglas Walker MRN: [id number] Date of Birth: 15-May-1925  Today's Date: 02/03/2021 OT Individual Time: 4481-8563 OT Individual Time Calculation (min): 58 min  Short Term Goals: Week 1:  OT Short Term Goal 1 (Week 1): Pt will complete toilet transfer with CGA OT Short Term Goal 2 (Week 1): Pt will correctly orient his shirt without cues and with 1st presentation OT Short Term Goal 3 (Week 1): Pt will complete LB dressing with Min A sit<stand  Skilled Therapeutic Interventions/Progress Updates:    [name] in bed, very motivated to shower. CGA for ambulatory transfer to the TTB using RW. Pt bathed while standing for 90% of the time with close supervision-CGA for balance. Dressing completed sit<stand from TTB using the RW and then he transferred to the recliner. Once he was positioned comfortably, pt completed grooming tasks with setup assist, setup for shaving as well using electric razor with pt reporting that he needs significantly increased time before finishing. Notified nursing staff to check in on him. Pt left in the recliner with all needs within reach and chair alarm set.  Note that pt did not have his hearing aides today and more time was needed for communication due to that and also due to expressive aphasia  Therapy Documentation Precautions:  Precautions Precautions: Fall Precaution Comments: Aphasic, HOH, legally blind in L eye. Restrictions Weight Bearing Restrictions: No Vital Signs: Therapy Vitals Temp: 98.1 F (36.7 C) Pulse Rate: 69 Resp: 20 BP: 124/60 Patient Position (if appropriate): Lying Oxygen Therapy SpO2: 100 % O2 Device: Room Air Pain: no c/o pain during tx   ADL: ADL Eating: Not assessed Grooming: Contact guard Where Assessed-Grooming: Standing at sink Upper Body Bathing: Contact guard Where Assessed-Upper Body Bathing: Standing at sink Lower Body Bathing: Contact guard Where  Assessed-Lower Body Bathing: Sitting at sink,Standing at sink Upper Body Dressing: Contact guard Where Assessed-Upper Body Dressing: Standing at sink Lower Body Dressing: Moderate assistance Where Assessed-Lower Body Dressing: Standing at sink,Sitting at sink Toileting: Not assessed Tub/Shower Transfer: Not assessed      Therapy/Group: Individual Therapy  Monnica Saltsman A Shanese Riemenschneider 02/03/2021, 3:50 PM

## 2021-02-03 NOTE — Progress Notes (Signed)
RN made RT aware pt could not tolerate CPAP. Pt back on RA with good O2 sats.

## 2021-02-03 NOTE — Progress Notes (Signed)
[place] PHYSICAL MEDICINE & REHABILITATION PROGRESS NOTE   Subjective/Complaints: Patient seen sitting up in bed this morning.  He states he slept well overnight.  He notes some back pain, but has not requested medications.  ROS: Limited due to cognition, but appears to deny CP, shortness of breath, nausea, vomiting, diarrhea.  Objective:   No results found. Recent Labs    02/01/21 0351 02/02/21 0527  WBC 7.5 6.5  HGB 10.9* 10.6*  HCT 33.3* 31.3*  PLT 98* 100*   Recent Labs    02/01/21 0351 02/02/21 0527  NA 143 143  K 3.7 3.6  CL 109 109  CO2 23 25  GLUCOSE 99 126*  BUN 28* 27*  CREATININE 1.43* 1.37*  CALCIUM 8.8* 8.8*    Intake/Output Summary (Last 24 hours) at 02/03/2021 1036 Last data filed at 02/03/2021 1012 Gross per 24 hour  Intake 500 ml  Output 300 ml  Net 200 ml        Physical Exam: Vital Signs Blood pressure 125/71, pulse 70, temperature 98.5 F (36.9 C), temperature source Oral, resp. rate 20, height 5\' 9"  (1.753 m), weight 90.8 kg, SpO2 95 %. Constitutional: No distress . Vital signs reviewed. HENT: Normocephalic.  Atraumatic. Eyes: EOMI. No discharge. Cardiovascular: No JVD.  RRR. Respiratory: Normal effort.  No stridor.  Bilateral clear to auscultation. GI: Non-distended.  BS +. Skin: Warm and dry.   Scattered ecchymosis Psych: Slowed.  Flat. Musc: No edema in extremities.  No tenderness in extremities. Neuro: Alert HOH Facial tremor Motor: LUE/LLE: 4+/5 RUE/RLE: Appears to be 4/5 proximal distal  Assessment/Plan: 1. Functional deficits which require 3+ hours per day of interdisciplinary therapy in a comprehensive inpatient rehab setting.  Physiatrist is providing close team supervision and 24 hour management of active medical problems listed below.  Physiatrist and rehab team continue to assess barriers to discharge/monitor patient progress toward functional and medical goals  Care Tool:  Bathing    Body parts bathed by  patient: Right arm,Left arm,Chest,Abdomen,Front perineal area,Buttocks,Right upper leg,Left upper leg,Right lower leg,Left lower leg,Face         Bathing assist Assist Level: Contact Guard/Touching assist     Upper Body Dressing/Undressing Upper body dressing   What is the patient wearing?: Pull over shirt    Upper body assist Assist Level: Contact Guard/Touching assist    Lower Body Dressing/Undressing Lower body dressing      What is the patient wearing?: Pants,Underwear/pull up     Lower body assist Assist for lower body dressing: Moderate Assistance - Patient 50 - 74%     Toileting Toileting Toileting Activity did not occur (Clothing management and hygiene only): N/A (no void or bm)  Toileting assist Assist for toileting: Moderate Assistance - Patient 50 - 74%     Transfers Chair/bed transfer  Transfers assist     Chair/bed transfer assist level: Minimal Assistance - Patient > 75%     Locomotion Ambulation   Ambulation assist      Assist level: Minimal Assistance - Patient > 75% Assistive device: Walker-rolling Max distance: 150   Walk 10 feet activity   Assist     Assist level: Minimal Assistance - Patient > 75% Assistive device: Walker-rolling   Walk 50 feet activity   Assist    Assist level: Minimal Assistance - Patient > 75% Assistive device: Walker-rolling    Walk 150 feet activity   Assist    Assist level: Minimal Assistance - Patient > 75% Assistive device: Walker-rolling  Walk 10 feet on uneven surface  activity   Assist     Assist level: Minimal Assistance - Patient > 75% Assistive device: Aeronautical engineer Will patient use wheelchair at discharge?: No             Wheelchair 50 feet with 2 turns activity    Assist            Wheelchair 150 feet activity     Assist          Blood pressure 125/71, pulse 70, temperature 98.5 F (36.9 C), temperature source Oral,  resp. rate 20, height 5\' 9"  (1.753 m), weight 90.8 kg, SpO2 95 %.    Medical Problem List and Plan: 1.  Right hemiparesis and aphasia secondary to left cortical infarct/embolic left MCA  Continue CIR 2.  Antithrombotics: -DVT/anticoagulation: Eliquis             -antiplatelet therapy: N/A 3. Pain Management: Topamax 50 mg twice daily, Lyrica 25 mg nightly, Cymbalta 20 mg daily- c/o sides hurting- will add tramadol prn for severe pain  Appears to be controlled on 2/5 4. Mood: Provide emotional support             -antipsychotic agents: N/A 5. Neuropsych: This patient is capable of making decisions on his own behalf. 6. Skin/Wound Care: Routine skin checks 7. Fluids/Electrolytes/Nutrition: Routine in and outs. 8.  PAF/pacemaker.  Cardiac rate controlled.  Continue Eliquis 9.  CKD stage IIIB    Creatinine 1.37 on 2/4, labs ordered for Monday  Encourage fluids 10.  Diastolic congestive heart failure.  Patient on Lasix 60 mg daily prior to admission and has been on hold since admission.  Resume as needed monitor for any signs of fluid overload Filed Weights   02/01/21 1608  Weight: 90.8 kg   Daily weights ordered 11.  Essential tremor.  Mysoline 250 mg twice daily- mainly RUE and jaw  Persistent on 2/5 12.  BPH.  Flomax 0.4 mg twice daily, Proscar 5 mg daily 13.  Legally blind.  Patient independent ADLs prior to admission 14.  Hypothyroidism: Synthroid 15.  History of ESBL 07/2020.  Contact precautions for life per hospital policy.  16. Apnea episodes  2/4- dropped sats to 84% during apnea episode yesterday- still having apnea- will try and get CPAP for pt- hopefully would wear.  17.  Post stroke dysphagia  D1 nectars, advance diet as tolerated  LOS: 2 days A FACE TO FACE EVALUATION WAS PERFORMED  Seva Chancy Lorie Phenix 02/03/2021, 10:36 AM

## 2021-02-03 NOTE — Progress Notes (Signed)
Pt attempted to wear CPAP overnight nut unable to tolerate. Pt exhibiting anxienty while wearing the mask stating he felt he couldn't breathe or sleep with the mask on. O2 saturations were monitored and maintained at 99%. Pt no longer wearing mask and is currently on RA at 98% O2 saturation. Will continue to monitor.

## 2021-02-04 NOTE — Progress Notes (Signed)
Speech Language Pathology Daily Session Note  Patient Details  Name: Douglas Walker MRN: [id number] Date of Birth: 1925-06-27  Today's Date: 02/04/2021 SLP Individual Time: 1105-1200 SLP Individual Time Calculation (min): 55 min  Short Term Goals: Week 1: SLP Short Term Goal 1 (Week 1): Pt will consume therapeutic trials of thin H2O with minimal overt s/sx X2 prior to MBS. SLP Short Term Goal 2 (Week 1): Pt will consume trials of dysphagia 2 (minced/ground) solids with efficienct mastication and oral clearance X2 prior to advancement. SLP Short Term Goal 3 (Week 1): Pt will demonstrate ability to express his thoughts and needs with overall Min A cues for use of strategies for word finding and fluency. SLP Short Term Goal 4 (Week 1): Pt will detect and correct verbal errors (including perseverations) with Min A cues. SLP Short Term Goal 5 (Week 1): Pt will selectively attend to functional tasks for 5 minute intervals with Min A cues for redirection.  Skilled Therapeutic Interventions:  Pt was seen for skilled ST targeting goals for communication.  During functional conversations with SLP, pt needed mod assist and more than a reasonable amount of time to recognize and correct verbal errors.  Pt became visibly frustrated x3 with the difficulty he was experiencing finding the word he wanted to say but he remained motivated to stay engaged in conversations with SLP.   Pt was left in bed with bed alarm set and call bell within reach.  Continue per current plan of care.    Pain Pain Assessment Pain Scale: 0-10 Pain Score: 0-No pain  Therapy/Group: Individual Therapy  Abria Vannostrand, Selinda Orion 02/04/2021, 3:32 PM

## 2021-02-04 NOTE — Plan of Care (Signed)
  Problem: Consults Goal: RH STROKE PATIENT EDUCATION Description: See Patient Education module for education specifics  Outcome: Progressing Goal: Nutrition Consult-if indicated Outcome: Progressing   Problem: RH BOWEL ELIMINATION Goal: RH STG MANAGE BOWEL WITH ASSISTANCE Description: STG Manage Bowel with supervision Assistance. Outcome: Progressing Goal: RH STG MANAGE BOWEL W/MEDICATION W/ASSISTANCE Description: STG Manage Bowel with Medication with supervision Assistance. Outcome: Progressing   Problem: RH BLADDER ELIMINATION Goal: RH STG MANAGE BLADDER WITH ASSISTANCE Description: STG Manage Bladder With supervision Assistance Outcome: Progressing   Problem: RH SKIN INTEGRITY Goal: RH STG MAINTAIN SKIN INTEGRITY WITH ASSISTANCE Description: STG Maintain Skin Integrity With supervision Assistance. Outcome: Progressing Goal: RH STG ABLE TO PERFORM INCISION/WOUND CARE W/ASSISTANCE Description: STG Able To Perform Incision/Wound Care With supervision Assistance. Outcome: Progressing   Problem: RH SAFETY Goal: RH STG ADHERE TO SAFETY PRECAUTIONS W/ASSISTANCE/DEVICE Description: STG Adhere to Safety Precautions With supervision Assistance/Device. Outcome: Progressing   Problem: RH PAIN MANAGEMENT Goal: RH STG PAIN MANAGED AT OR BELOW PT'S PAIN GOAL Description: <3 on a 0-10 pain scale. Outcome: Progressing   Problem: RH KNOWLEDGE DEFICIT Goal: RH STG INCREASE KNOWLEDGE OF DYSPHAGIA/FLUID INTAKE Description: Patient will demonstrate safe swallowing techniques of thickened liquids and dysphagia diets so staff can advance diet and liquids to next level. Outcome: Progressing Goal: RH STG INCREASE KNOWLEDGE OF STROKE PROPHYLAXIS Description: Patient will demonstrate knowledge of medications used to aid in prevention of future strokes with education materials and handouts provided by staff. Outcome: Progressing   

## 2021-02-04 NOTE — Progress Notes (Signed)
Physical Therapy Session Note  Patient Details  Name: Douglas Walker MRN: [id number] Date of Birth: Sep 21, 1925  Today's Date: 02/04/2021 PT Individual Time: 1300-1415 PT Individual Time Calculation (min): 75 min   Short Term Goals: Week 1:  PT Short Term Goal 1 (Week 1): pt to demonstrate supervision supine<>sit PT Short Term Goal 2 (Week 1): pt to demonstrate CGA sit<>stand with LRAD PT Short Term Goal 3 (Week 1): pt to demonstrate gait with LRAD for 200' at [place] Therapeutic Interventions/Progress Updates:    Pt received seated in bed, agreeable to PT session. No complaints of pain. Bed mobility Supervision with use of bedrail. Sit to stand with Supervision to RW throughout session. Ambulation 2 x 150 ft with RW and CGA for balance, cues to keep RW closer to body during gait. Ascend/descend 12 x 6" stairs with 2 handrails and CGA for balance, step-through gait pattern. Ambulation through obstacle course navigating up/down 4" to 6" step, stepping over obstacles, standing on airex with no UE support with min A x 15 sec, weaving through cones, and performing alt L/R cone taps with RW and CGA overall needed for balance. Sit to stand from low, pliable surface of recliner with RW and min A. Sit to/from supine on real bed at 26" height at Supervision level. Toilet transfer with Supervision, assist needed for brief management. Pt returned to bed at end of session. Pt left semi-reclined in bed with needs in reach, bed alarm in place, son present.  Therapy Documentation Precautions:  Precautions Precautions: Fall Precaution Comments: Aphasic, HOH, legally blind in L eye. Restrictions Weight Bearing Restrictions: No    Therapy/Group: Individual Therapy   Excell Seltzer, PT, DPT  02/04/2021, 5:55 PM

## 2021-02-04 NOTE — IPOC Note (Addendum)
Overall Plan of Care [facility]) Patient Details Name: Douglas Walker MRN: [id number] DOB: Apr 14, 1925  Admitting Diagnosis: Left middle cerebral artery stroke [facility])  Hospital Problems: Principal Problem:   Left middle cerebral artery stroke [facility]) Active Problems:   Dysphagia, post-stroke   Chronic diastolic congestive heart failure (HCC)   Chronic pain syndrome     Functional Problem List: Nursing Medication Management,Safety,Sensory,Perception  PT Behavior,Balance,Safety,Skin Integrity,Endurance,Pain  OT Balance,Cognition,Endurance,Perception,Pain,Safety,Skin Integrity,Vision  SLP Cognition,Linguistic,Nutrition  TR         Basic ADL's: OT Eating,Grooming,Bathing,Dressing,Toileting     Advanced  ADL's: OT Simple Meal Preparation     Transfers: PT Furniture,Car,Bed to Chair,Bed Mobility  OT Toilet,Tub/Shower     Locomotion: PT Wheelchair Mobility,Ambulation     Additional Impairments: OT Fuctional Use of Upper Extremity  SLP Swallowing,Communication,Social Cognition expression Attention  TR      Anticipated Outcomes Item Anticipated Outcome  Self Feeding Supervision  Swallowing  Supervision A   Basic self-care  Supervision  Toileting  Supervision   Bathroom Transfers Supervision  Bowel/Bladder  patient will continue to be continent of bowel and bladder during admission  Transfers  CGA  Locomotion  CGA  Communication  Supervision A  Cognition  Supervision A  Pain  patient will be pain free or pain less than 3 during admission  Safety/Judgment  Patient will be free from falls and adhere to safety plan   Therapy Plan: PT Intensity: Minimum of 1-2 x/day ,45 to 90 minutes PT Frequency: 5 out of 7 days PT Duration Estimated Length of Stay: 10-14 days OT Intensity: Minimum of 1-2 x/day, 45 to 90 minutes OT Frequency: 5 out of 7 days OT Duration/Estimated Length of Stay: 10-12 days SLP Intensity: Minumum of 1-2 x/day, 30 to 90 minutes SLP Frequency:  3 to 5 out of 7 days SLP Duration/Estimated Length of Stay: 10-14 days   Due to the current state of emergency, patients may not be receiving their 3-hours of Medicare-mandated therapy.   Team Interventions: Nursing Interventions Patient/Family Education,Disease Management/Prevention,Medication [employer] Precaution Training  PT interventions Ambulation/gait training,Community Corporate treasurer re-education,Psychosocial support,Stair training,UE/LE Strength taining/ROM,Wheelchair propulsion/positioning,UE/LE Coordination activities,Therapeutic Activities,Skin care/wound management,Pain management,Discharge planning,Balance/vestibular training,Functional electrical stimulation,Cognitive remediation/compensation,Disease management/prevention,Functional mobility training,Patient/family education,Splinting/orthotics,Therapeutic Exercise,Visual/perceptual remediation/compensation  OT Interventions Balance/vestibular training,DME/adaptive equipment instruction,Patient/family education,Therapeutic Activities,Psychosocial support,Therapeutic Exercise,Cognitive remediation/compensation,Community reintegration,Functional mobility training,Self Care/advanced ADL retraining,UE/LE Strength taining/ROM,UE/LE Coordination activities,Skin care/wound managment,Neuromuscular re-education,Discharge planning,Disease mangement/prevention,Pain management,Visual/perceptual remediation/compensation  SLP Interventions Functional tasks,Dysphagia/aspiration precaution training,Cueing hierarchy,Cognitive remediation/compensation,Speech/Language facilitation,Therapeutic Activities,Patient/family education,Internal/external aids  TR Interventions    SW/CM Interventions Discharge Planning,Psychosocial Support,Patient/Family Education,Disease Management/Prevention   Barriers to Discharge MD  Medical stability, Home enviroment access/loayout, Incontinence, Lack  of/limited family support, Weight, Weight bearing restrictions and cognition/aphasia  Nursing Inaccessible home environment,Decreased caregiver support,Home environment access/layout,Incontinence,Lack of/limited family support,Medication compliance,Behavior,Nutrition means    PT Home environment access/layout low vision, mild cognitive impairments  OT Incontinence    SLP      SW       Team Discharge Planning: Destination: PT-Home ,OT-  Home , SLP-Home Projected Follow-up: PT- , OT-  Home health OT, SLP-Home Health SLP,24 hour supervision/assistance Projected Equipment Needs: PT-To be determined, OT- To be determined, SLP-None recommended by SLP Equipment Details: PT-has RW, OT-  Patient/family involved in discharge planning: PT- Patient,Family member/caregiver,  OT-Patient, SLP-Patient,Family member/caregiver  MD ELOS: 10-14 days Medical Rehab Prognosis:  Fair Assessment: Pt is a 87 yr old male with hx of  PAF, Headaches, dCHF, legally blind, and essential tremor with CKD 3b, who sustained a L cortical infarct  with R hemiparesis and aphasia.  He also has been having apneic episodes- trying to get him a CPAP.   Goals- Supervision to CGA by d/c.    See Team Conference Notes for weekly updates to the plan of care

## 2021-02-04 NOTE — Progress Notes (Signed)
Physical Therapy Session Note  Patient Details  Name: Douglas Walker MRN: [id number] Date of Birth: 1925-10-11  Today's Date: 02/04/2021 PT Individual Time: 0808-0909 PT Individual Time Calculation (min): 61 min   Short Term Goals: Week 1:  PT Short Term Goal 1 (Week 1): pt to demonstrate supervision supine<>sit PT Short Term Goal 2 (Week 1): pt to demonstrate CGA sit<>stand with LRAD PT Short Term Goal 3 (Week 1): pt to demonstrate gait with LRAD for 200' at [place] Therapeutic Interventions/Progress Updates: Pt presented in bed agreeable to therapy. Performed bed mobility with use of bed features and supervision A. Pt donned shirt with set up and then ambulated to toilet with RW and CGA. Performed toilet transfers with supervision (+ urinary void). Pt then returned to bed and threaded pants with minA for threading L foot only as pt was able to complete rest with supervision. Pt stood with close S and pulled pants over hips then ambulated to sink to perform oral hygiene and wash face in standing. Pt was able to maintain standing ~51mn then ambulated to rehab gym with RW and close S and no rest breaks. Pt participated in toe taps to 4in step for wt shifting and balance 2 x10. Pt also performed standing hip abd/add, hamstring curls x 10 bilaterally as well as seated hip flexion and LAQ x 10 bilaterally.Participated in 2 bouts of horseshoes for dynamic reaching tasks with supervision. Pt then ambulated back to room in same manner as prior and agreeable to sit in recliner for short period as bed was wet from sweat. Transferred to recliner with supervision and pt requesting to eat some applesauce which pt ate without coughing. Pt left with call bell within reach and needs met with NT notified of pt's desire to return to bed soon.      Therapy Documentation Precautions:  Precautions Precautions: Fall Precaution Comments: Aphasic, HOH, legally blind in L eye. Restrictions Weight Bearing  Restrictions: No   Therapy/Group: Individual Therapy  Duard Spiewak  [place], PTA  02/04/2021, 1:35 PM

## 2021-02-05 MED ORDER — POLYETHYLENE GLYCOL 3350 17 G PO PACK
17.0000 g | PACK | Freq: Every day | ORAL | Status: DC | PRN
  Administered 2021-02-05: 17 g via ORAL
  Filled 2021-02-05: qty 1

## 2021-02-05 MED ORDER — RESOURCE THICKENUP CLEAR PO POWD
ORAL | Status: DC | PRN
  Filled 2021-02-05: qty 125

## 2021-02-05 NOTE — Progress Notes (Signed)
Physical Therapy Session Note  Patient Details  Name: Douglas Walker MRN: [id number] Date of Birth: Aug 09, 1925  Today's Date: 02/05/2021 PT Individual Time: 0901-0958 PT Individual Time Calculation (min): 57 min   Short Term Goals: Week 1:  PT Short Term Goal 1 (Week 1): pt to demonstrate supervision supine<>sit PT Short Term Goal 2 (Week 1): pt to demonstrate CGA sit<>stand with LRAD PT Short Term Goal 3 (Week 1): pt to demonstrate gait with LRAD for 200' at [place] Therapeutic Interventions/Progress Updates:    pt received in bed and agreeable to therapy. Pt requested to wash up and change clothes prior to leaving room. Pt directed in supine>sit CGA from flat bed, and donned pants in sitting min A and miN A for Sit to stand to Rolling walker and pulled pants over hips CGA. Pt ambulated to sink, min A 15'  And stood with one UE for 15 mins for washing up and donning shirt, CGA -min A with wife BOS and mildly flexed trunk without ability to correct with VC. Pt then directed in gait training with 250'  min A-CGA. Pt directed in NMRE standing balance training with and without UE support at [facility] for reaching cross midline and overhead 2x10. Pt then directed in gait training with Rolling walker to return to room, 250' min A with VC for trunk extension and increased stride length. Pt directed to transfer to recliner, completed at [facility]. Pt left in recliner, alarm set, All needs in reach and in good condition. Call light in hand.    Therapy Documentation Precautions:  Precautions Precautions: Fall Precaution Comments: Aphasic, HOH, legally blind in L eye. Restrictions Weight Bearing Restrictions: No General:   Vital Signs:  Pain: Pain Assessment Pain Scale: 0-10 Pain Score: 0-No pain Pain Type: Acute pain Pain Location: Head Pain Descriptors / Indicators: Aching Pain Frequency: Intermittent Patients Stated Pain Goal: 1 Mobility:   Locomotion :    Trunk/Postural Assessment :     Balance:   Exercises:   Other Treatments:      Therapy/Group: Individual Therapy  [name] 02/05/2021, 9:59 AM

## 2021-02-05 NOTE — Progress Notes (Signed)
RT NOTES: Pt's son requested to speak with RT about pt's CPAP. Son asked about the settings and if patient could have humidification added to his machine d/t patient complaining about it "drying him out". This RT explained that he was set to auto titrate on the cpap and explained what that meant. Also, told son that it would be passed on to nightshift about the humidification. Son expressed thanks and understanding.

## 2021-02-05 NOTE — Progress Notes (Signed)
Occupational Therapy Session Note  Patient Details  Name: Douglas Walker MRN: [id number] Date of Birth: 1925/09/03  Today's Date: 02/05/2021 OT Individual Time: 1215-1300 OT Individual Time Calculation (min): 45 min   Short Term Goals: Week 1:  OT Short Term Goal 1 (Week 1): Pt will complete toilet transfer with CGA OT Short Term Goal 2 (Week 1): Pt will correctly orient his shirt without cues and with 1st presentation OT Short Term Goal 3 (Week 1): Pt will complete LB dressing with Min A sit<stand  Skilled Therapeutic Interventions/Progress Updates:    [name] in the bed with no c/o pain. Agreeable to eat his lunch. He transitioned to EOB with 1 cue to initiate. Used clock method to explain to pt the location of his pureed food items due to blindness. He initially required assistance to scoop his food due to Rt hand tremor. Pt progressing to doing this himself when we problem solved an adaptive method enabling him to use both UEs (fork in the Lt hand, spoon in the Rt) to scoop and manage food on plate without additional assistance. He initiated leaning closer towards tray to limit degrees of freedom and decrease spillage. Pt required significantly increased time to meet demands of eating tasks at [place] level overall, consuming ~95% of his meal. He then scooted up towards [facility] with supervision assist and then returned to supine. Left him in bed with all needs within reach and bed alarm set.   Therapy Documentation Precautions:  Precautions Precautions: Fall Precaution Comments: Aphasic, HOH, legally blind in L eye. Restrictions Weight Bearing Restrictions: No Vital Signs: Therapy Vitals Temp: 97.8 F (36.6 C) Temp Source: Oral Pulse Rate: 69 Resp: 18 BP: 134/69 Patient Position (if appropriate): Lying Oxygen Therapy SpO2: 93 % O2 Device: Room Air Pain: Pain Assessment Pain Scale: 0-10 Pain Score: 0-No pain Faces Pain Scale: No hurt ADL: ADL Eating: Not  assessed Grooming: Contact guard Where Assessed-Grooming: Standing at sink Upper Body Bathing: Contact guard Where Assessed-Upper Body Bathing: Standing at sink Lower Body Bathing: Contact guard Where Assessed-Lower Body Bathing: Sitting at sink,Standing at sink Upper Body Dressing: Contact guard Where Assessed-Upper Body Dressing: Standing at sink Lower Body Dressing: Moderate assistance Where Assessed-Lower Body Dressing: Standing at sink,Sitting at sink Toileting: Not assessed Tub/Shower Transfer: Not assessed      Therapy/Group: Individual Therapy  Trevor Duty A Yoland Scherr 02/05/2021, 3:39 PM

## 2021-02-05 NOTE — Progress Notes (Signed)
[place] PHYSICAL MEDICINE & REHABILITATION PROGRESS NOTE   Subjective/Complaints: Pt reports "I'm alive".  Didn't sleep well.  LBM Friday- will give Miralax.   Eating well- ate entire breakfast- in spite of D1 diet- and drinking nectar thick liquids.  .  ROS: limited due to cognition/aphasia  Objective:   No results found. No results for input(s): WBC, HGB, HCT, PLT in the last 72 hours. No results for input(s): NA, K, CL, CO2, GLUCOSE, BUN, CREATININE, CALCIUM in the last 72 hours.  Intake/Output Summary (Last 24 hours) at 02/05/2021 0834 Last data filed at 02/05/2021 0320 Gross per 24 hour  Intake 480 ml  Output --  Net 480 ml        Physical Exam: Vital Signs Blood pressure 128/74, pulse 70, temperature (!) 97.5 F (36.4 C), resp. rate 20, height 5\' 9"  (1.753 m), weight 91.1 kg, SpO2 97 %. Constitutional: awake, eating puree diet, appropriate, aphasia, NAD HENT: Normocephalic.  Atraumatic. Eyes: EOMI. No discharge. Cardiovascular: RRR- no JVD Respiratory: CTA B/L- no W/R/R- good air movement GI: Soft, NT, ND, (+)BS  Skin: Warm and dry.   Scattered ecchymosis Psych: aphasic, slowed responses, flat affect; HOH Musc: No edema in extremities.  No tenderness in extremities. Neuro: Alert, HOH Facial tremor Motor: LUE/LLE: 4+/5 RUE/RLE: Appears to be 4/5 proximal distal  Assessment/Plan: 1. Functional deficits which require 3+ hours per day of interdisciplinary therapy in a comprehensive inpatient rehab setting.  Physiatrist is providing close team supervision and 24 hour management of active medical problems listed below.  Physiatrist and rehab team continue to assess barriers to discharge/monitor patient progress toward functional and medical goals  Care Tool:  Bathing    Body parts bathed by patient: Right arm,Left arm,Chest,Abdomen,Front perineal area,Buttocks,Right upper leg,Left upper leg,Face   Body parts bathed by helper: Right lower leg,Left lower  leg (due to tight space in shower, was able to wash feet sitting at the sink)     Bathing assist Assist Level: Contact Guard/Touching assist     Upper Body Dressing/Undressing Upper body dressing   What is the patient wearing?: Pull over shirt    Upper body assist Assist Level: Contact Guard/Touching assist    Lower Body Dressing/Undressing Lower body dressing      What is the patient wearing?: Incontinence brief     Lower body assist Assist for lower body dressing: Moderate Assistance - Patient 50 - 74%     Toileting Toileting Toileting Activity did not occur (Clothing management and hygiene only): N/A (no void or bm)  Toileting assist Assist for toileting: Moderate Assistance - Patient 50 - 74%     Transfers Chair/bed transfer  Transfers assist     Chair/bed transfer assist level: Supervision/Verbal cueing     Locomotion Ambulation   Ambulation assist      Assist level: Contact Guard/Touching assist Assistive device: Walker-rolling Max distance: 150   Walk 10 feet activity   Assist     Assist level: Contact Guard/Touching assist Assistive device: Walker-rolling   Walk 50 feet activity   Assist    Assist level: Contact Guard/Touching assist Assistive device: Walker-rolling    Walk 150 feet activity   Assist    Assist level: Contact Guard/Touching assist Assistive device: Walker-rolling    Walk 10 feet on uneven surface  activity   Assist     Assist level: Minimal Assistance - Patient > 75% Assistive device: Aeronautical engineer Will patient use wheelchair at discharge?: No  Wheelchair 50 feet with 2 turns activity    Assist            Wheelchair 150 feet activity     Assist          Blood pressure 128/74, pulse 70, temperature (!) 97.5 F (36.4 C), resp. rate 20, height 5\' 9"  (1.753 m), weight 91.1 kg, SpO2 97 %.    Medical Problem List and Plan: 1.  Right  hemiparesis and aphasia secondary to left cortical infarct/embolic left MCA  Continue CIR 2.  Antithrombotics: -DVT/anticoagulation: Eliquis             -antiplatelet therapy: N/A 3. Pain Management: Topamax 50 mg twice daily, Lyrica 25 mg nightly, Cymbalta 20 mg daily- c/o sides hurting- will add tramadol prn for severe pain  2/7- on pain regimen- doing well- con't regimen 4. Mood: Provide emotional support             -antipsychotic agents: N/A 5. Neuropsych: This patient is capable of making decisions on his own behalf. 6. Skin/Wound Care: Routine skin checks 7. Fluids/Electrolytes/Nutrition: Routine in and outs. 8.  PAF/pacemaker.  Cardiac rate controlled.  Continue Eliquis 9.  CKD stage IIIB    Creatinine 1.37 on 2/4, labs ordered for Monday  2/7- will order labs for Tuesday 2/8- since don't have today   Encourage fluids 10.  Diastolic congestive heart failure.  Patient on Lasix 60 mg daily prior to admission and has been on hold since admission.  Resume as needed monitor for any signs of fluid overload Filed Weights   02/01/21 1608 02/04/21 0532 02/05/21 0500  Weight: 90.8 kg 89.8 kg 91.1 kg   Daily weights ordered 11.  Essential tremor.  Mysoline 250 mg twice daily- mainly RUE and jaw  Persistent on 2/5  2/7- still have tremors- in lower jaw and RUE- con't regimen 12.  BPH.  Flomax 0.4 mg twice daily, Proscar 5 mg daily 13.  Legally blind.  Patient independent ADLs prior to admission 14.  Hypothyroidism: Synthroid 15.  History of ESBL 07/2020.  Contact precautions for life per hospital policy.  16. Apnea episodes  2/4- dropped sats to 84% during apnea episode yesterday- still having apnea- will try and get CPAP for pt- hopefully would wear.  17.  Post stroke dysphagia  D1 nectars, advance diet as tolerated  2/7- is doing well with intake including nectar thick liquids- con't regimen 18. Constipation  2/7- LBM Friday 2/4- will try Miralax today and if not effective, will give  Sorbitol.   LOS: 4 days A FACE TO FACE EVALUATION WAS PERFORMED  Jadine Brumley 02/05/2021, 8:34 AM

## 2021-02-05 NOTE — Progress Notes (Signed)
RT to patients room to discuss CPAP machine and options for humidity. Explained why I was in the room, showed pt CPAP and mask, and asked if mask "dried him out" last night. Pt's reply was "I dont need any mask". Pt remains on RA at this time. RT will continue to monitor as needed.

## 2021-02-05 NOTE — Progress Notes (Signed)
Occupational Therapy Session Note  Patient Details  Name: Douglas Walker MRN: [id number] Date of Birth: 1925/06/19  Today's Date: 02/05/2021 OT Individual Time: 1001-1056 OT Individual Time Calculation (min): 55 min    Short Term Goals: Week 1:  OT Short Term Goal 1 (Week 1): Pt will complete toilet transfer with CGA OT Short Term Goal 2 (Week 1): Pt will correctly orient his shirt without cues and with 1st presentation OT Short Term Goal 3 (Week 1): Pt will complete LB dressing with Min A sit<stand   Skilled Therapeutic Interventions/Progress Updates:    [name] at time of session sitting up in chair in room, agreeable to OT session, no pain. Pt very HOH despite hearing aides, increased volume provided throughout session. Expressive aphasia also present, pt provided increased time throughout for communication deficits. Pt needing to use bathroom, adamantly declined gait belt but walked with RW chair > toilet > sink with close supervision/CGA. 3/3 toilet tasks with CGA. Able to wash hands at sink standing CGA. Walked room <> gym with close supervision/CGA as well, performed 2 rounds of dynamic standing activities at [place] for NMR coordination training, first round on level 10 width and 81% accuracy, second round level 9 with 83% accuracy. Note size of object enlarged d/t visual deficits. Once back in room, set up with nectar thick drink and pt drank with set up. Pt in chair with alarm on call bell in reach.   Therapy Documentation Precautions:  Precautions Precautions: Fall Precaution Comments: Aphasic, HOH, legally blind in L eye. Restrictions Weight Bearing Restrictions: No     Therapy/Group: Individual Therapy  [name] 02/05/2021, 7:19 AM

## 2021-02-05 NOTE — Progress Notes (Signed)
Physical Therapy Session Note  Patient Details  Name: Douglas Walker MRN: [id number] Date of Birth: 01/29/1925  Today's Date: 02/05/2021 PT Individual Time: 5427-0623 PT Individual Time Calculation (min): 40 min   Short Term Goals: Week 1:  PT Short Term Goal 1 (Week 1): pt to demonstrate supervision supine<>sit PT Short Term Goal 2 (Week 1): pt to demonstrate CGA sit<>stand with LRAD PT Short Term Goal 3 (Week 1): pt to demonstrate gait with LRAD for 200' at [place] Therapeutic Interventions/Progress Updates:    Patient supine and asleep in bed upon PT arrival. Patient  Easily aroused and agreeable to PT session. Patient with no pain complaint throughout session. Extra time in session required for toilet transfer and toileting.  Therapeutic Activity: Bed Mobility: Patient performed supine to/from sit with supervision. Provided verbal cues for scooting forward for Bil foot placement on floor. On return to supine, vc provided for improved positioning in bed for more neutral posturing. Transfers: Patient performed STS and SPVT transfers at supervision throughout session to/ from various seats/ heights including toilet and with/ without armrests. No cueing required this session.   Gait Training:  Patient ambulated >300 x2 feet using RW with SBA/ supervision. Ambulated with forward flexed posture and increased gait speed. With vc pt is able to decrease speed and sustain good control. Increased tremor noted in RUE with fatigue, but no effect on overall quality of gait or walker mgmt. VC required  for use of RW at end of session in order to ambulate from middle of pt's room to bathroom as pt attempted to discard walker with turn to bathroom. Pt insists he does not need the walker to walk to the bathroom. Reminded pt that this PT just inquired of him in the gym if he could walk without the walker, and he replied, "NO." Pt accepts walker and continues to toilet.  Neuromuscular Re-ed: NMR  facilitated during session with focus onstatic and dynamic standing balance. Pt guided in dynamic step to reach activity to contrasting color targets on wall. He demos good use of RW for unilateral UE support, step out with one LE and reach to target with ipsi- and contralateral hand. No LOB noted. Initial unsteadiness improves with practice bilaterally.  Pt also guided in static stance on Airex pad for improved proprioception. Initial static stance requires time and tactile cueing for movements toward edge of BOS and then pt able to self correct. Improves with time and practice. Progressed to self perturbations with Bil arm movements out to sides and out in front of pt. Pt requires intermittent grabs to RW to maintain balance. Intermittent Min A with tc to maintaint.    In STS, pt also attains good standing balance on rise to stand without BUE support. Slight sway noted with increase in time of static stance, but no LOB and good ankle strategy to maintain.   NMR performed for improvements in motor control and coordination, balance, sequencing, judgement, and self confidence/ efficacy in performing all aspects of mobility at highest level of independence.   Patient supine in bed at end of session with brakes locked, bed alarm set, and all needs within reach.    Therapy Documentation Precautions:  Precautions Precautions: Fall Precaution Comments: Aphasic, HOH, legally blind in L eye. Restrictions Weight Bearing Restrictions: No   Therapy/Group: Individual Therapy  Alger Simons 02/05/2021, 4:26 PM

## 2021-02-06 LAB — CBC WITH DIFFERENTIAL/PLATELET
Abs Immature Granulocytes: 0.03 10*3/uL (ref 0.00–0.07)
Basophils Absolute: 0 10*3/uL (ref 0.0–0.1)
Basophils Relative: 1 %
Eosinophils Absolute: 0.2 10*3/uL (ref 0.0–0.5)
Eosinophils Relative: 5 %
HCT: 31.9 % — ABNORMAL LOW (ref 39.0–52.0)
Hemoglobin: 10.7 g/dL — ABNORMAL LOW (ref 13.0–17.0)
Immature Granulocytes: 1 %
Lymphocytes Relative: 27 %
Lymphs Abs: 1.3 10*3/uL (ref 0.7–4.0)
MCH: 32.8 pg (ref 26.0–34.0)
MCHC: 33.5 g/dL (ref 30.0–36.0)
MCV: 97.9 fL (ref 80.0–100.0)
Monocytes Absolute: 0.6 10*3/uL (ref 0.1–1.0)
Monocytes Relative: 12 %
Neutro Abs: 2.6 10*3/uL (ref 1.7–7.7)
Neutrophils Relative %: 54 %
Platelets: 112 10*3/uL — ABNORMAL LOW (ref 150–400)
RBC: 3.26 MIL/uL — ABNORMAL LOW (ref 4.22–5.81)
RDW: 16.1 % — ABNORMAL HIGH (ref 11.5–15.5)
WBC: 4.7 10*3/uL (ref 4.0–10.5)
nRBC: 0 % (ref 0.0–0.2)

## 2021-02-06 LAB — COMPREHENSIVE METABOLIC PANEL WITH GFR
ALT: 30 U/L (ref 0–44)
AST: 24 U/L (ref 15–41)
Albumin: 3.1 g/dL — ABNORMAL LOW (ref 3.5–5.0)
Alkaline Phosphatase: 120 U/L (ref 38–126)
Anion gap: 10 (ref 5–15)
BUN: 31 mg/dL — ABNORMAL HIGH (ref 8–23)
CO2: 24 mmol/L (ref 22–32)
Calcium: 9 mg/dL (ref 8.9–10.3)
Chloride: 107 mmol/L (ref 98–111)
Creatinine, Ser: 1.21 mg/dL (ref 0.61–1.24)
GFR, Estimated: 56 mL/min — ABNORMAL LOW (ref >60)
Glucose, Bld: 97 mg/dL (ref 70–99)
Potassium: 4.3 mmol/L (ref 3.5–5.1)
Sodium: 141 mmol/L (ref 135–145)
Total Bilirubin: 0.5 mg/dL (ref 0.3–1.2)
Total Protein: 6.4 g/dL — ABNORMAL LOW (ref 6.5–8.1)

## 2021-02-06 MED ORDER — SORBITOL 70 % SOLN
30.0000 mL | Freq: Once | Status: AC
  Administered 2021-02-06: 30 mL via ORAL
  Filled 2021-02-06: qty 30

## 2021-02-06 MED ORDER — SODIUM CHLORIDE 0.9 % IV SOLN: INTRAVENOUS | Status: AC

## 2021-02-06 NOTE — Progress Notes (Signed)
[place] PHYSICAL MEDICINE & REHABILITATION PROGRESS NOTE   Subjective/Complaints:  Hurts all over- wants some tylenol.   "don't know how he's feeling", but then said "OK"- doesn't need any help. Doesn't want CPAP in spite of apnea episodes seen on exam.    GBT:DVVOHYW due to aphasia/cognition  Objective:   No results found. Recent Labs    02/06/21 0452  WBC 4.7  HGB 10.7*  HCT 31.9*  PLT 112*   Recent Labs    02/06/21 0452  NA 141  K 4.3  CL 107  CO2 24  GLUCOSE 97  BUN 31*  CREATININE 1.21  CALCIUM 9.0    Intake/Output Summary (Last 24 hours) at 02/06/2021 0901 Last data filed at 02/06/2021 0830 Gross per 24 hour  Intake 1115 ml  Output --  Net 1115 ml        Physical Exam: Vital Signs Blood pressure 126/74, pulse 70, temperature 97.8 F (36.6 C), resp. rate 18, height 5\' 9"  (1.753 m), weight 93.6 kg, SpO2 98 %. Constitutional: awake, sitting up EOB - eating rapidly, NAD HENT: Normocephalic.  Atraumatic. Eyes: EOMI. No discharge. Cardiovascular: RRR- no JVD Respiratory: CTA B/L- no W/R/R- good air movement GI: Soft, NT, ND, (+)BS  Skin: Warm and dry.   Scattered ecchymosis Psych: aphasic, HOH- flat affect- very flat Musc: No edema in extremities.  No tenderness in extremities. Neuro: Alert, HOH Facial tremor Motor: LUE/LLE: 4+/5 RUE/RLE: Appears to be 4/5 proximal distal  Assessment/Plan: 1. Functional deficits which require 3+ hours per day of interdisciplinary therapy in a comprehensive inpatient rehab setting.  Physiatrist is providing close team supervision and 24 hour management of active medical problems listed below.  Physiatrist and rehab team continue to assess barriers to discharge/monitor patient progress toward functional and medical goals  Care Tool:  Bathing    Body parts bathed by patient: Right arm,Left arm,Chest,Abdomen,Front perineal area,Buttocks,Right upper leg,Left upper leg,Face   Body parts bathed by helper: Right  lower leg,Left lower leg (due to tight space in shower, was able to wash feet sitting at the sink)     Bathing assist Assist Level: Contact Guard/Touching assist     Upper Body Dressing/Undressing Upper body dressing   What is the patient wearing?: Pull over shirt    Upper body assist Assist Level: Contact Guard/Touching assist    Lower Body Dressing/Undressing Lower body dressing      What is the patient wearing?: Incontinence brief     Lower body assist Assist for lower body dressing: Moderate Assistance - Patient 50 - 74%     Toileting Toileting Toileting Activity did not occur Landscape architect and hygiene only): N/A (no void or bm)  Toileting assist Assist for toileting: Contact Guard/Touching assist     Transfers Chair/bed transfer  Transfers assist     Chair/bed transfer assist level: Supervision/Verbal cueing     Locomotion Ambulation   Ambulation assist      Assist level: Supervision/Verbal cueing Assistive device: Walker-rolling Max distance: 300 ft   Walk 10 feet activity   Assist     Assist level: Supervision/Verbal cueing Assistive device: Walker-rolling   Walk 50 feet activity   Assist    Assist level: Supervision/Verbal cueing Assistive device: Walker-rolling    Walk 150 feet activity   Assist    Assist level: Supervision/Verbal cueing Assistive device: Walker-rolling    Walk 10 feet on uneven surface  activity   Assist     Assist level: Minimal Assistance - Patient > 75%  Assistive device: Aeronautical engineer Will patient use wheelchair at discharge?: No             Wheelchair 50 feet with 2 turns activity    Assist            Wheelchair 150 feet activity     Assist          Blood pressure 126/74, pulse 70, temperature 97.8 F (36.6 C), resp. rate 18, height 5\' 9"  (1.753 m), weight 93.6 kg, SpO2 98 %.    Medical Problem List and Plan: 1.  Right  hemiparesis and aphasia secondary to left cortical infarct/embolic left MCA  Continue CIR 2.  Antithrombotics: -DVT/anticoagulation: Eliquis             -antiplatelet therapy: N/A 3. Pain Management: Topamax 50 mg twice daily, Lyrica 25 mg nightly, Cymbalta 20 mg daily- c/o sides hurting- will add tramadol prn for severe pain  2/7- on pain regimen- doing well- con't regimen  2/8- wants tylenol- "hurts all over"- will let nursing knw- con't regimen-  4. Mood: Provide emotional support             -antipsychotic agents: N/A 5. Neuropsych: This patient is capable of making decisions on his own behalf. 6. Skin/Wound Care: Routine skin checks 7. Fluids/Electrolytes/Nutrition: Routine in and outs. 8.  PAF/pacemaker.  Cardiac rate controlled.  Continue Eliquis 9.  CKD stage IIIB    Creatinine 1.37 on 2/4, labs ordered for Monday  2/7- will order labs for Tuesday 2/8- since don't have today  2/8- Cr 1.01 and BUN 31- slightly dry- but on nectar thick liquids- will recheck Thursday and if rising more, will give IVFs.    Encourage fluids 10.  Diastolic congestive heart failure.  Patient on Lasix 60 mg daily prior to admission and has been on hold since admission.  Resume as needed monitor for any signs of fluid overload Filed Weights   02/04/21 0532 02/05/21 0500 02/06/21 0500  Weight: 89.8 kg 91.1 kg 93.6 kg   Daily weights ordered 11.  Essential tremor.  Mysoline 250 mg twice daily- mainly RUE and jaw  Persistent on 2/5  2/8- tremors slightly better this AM-c on't regimen 12.  BPH.  Flomax 0.4 mg twice daily, Proscar 5 mg daily 13.  Legally blind.  Patient independent ADLs prior to admission 14.  Hypothyroidism: Synthroid 15.  History of ESBL 07/2020.  Contact precautions for life per hospital policy.  16. Apnea episodes  2/4- dropped sats to 84% during apnea episode yesterday- still having apnea- will try and get CPAP for pt- hopefully would wear.   2/8- pt refusing- says doesn't need- will  try and get to wear 17.  Post stroke dysphagia  D1 nectars, advance diet as tolerated  2/7- is doing well with intake including nectar thick liquids- con't regimen  2/8- BUN 31- per staff drinking well, but need to push more fluids PO 18. Constipation  2/7- LBM Friday 2/4- will try Miralax today and if not effective, will give Sorbitol.   2/8- LBM 2/6 small- will give Sorbitol today.    LOS: 5 days A FACE TO FACE EVALUATION WAS PERFORMED  Douglas Walker 02/06/2021, 9:01 AM

## 2021-02-06 NOTE — Progress Notes (Signed)
Occupational Therapy Session Note  Patient Details  Name: Douglas Walker MRN: [id number] Date of Birth: January 26, 1925  Today's Date: 02/06/2021 OT Individual Time: 4627-0350 OT Individual Time Calculation (min): 57 min    Short Term Goals: Week 1:  OT Short Term Goal 1 (Week 1): Pt will complete toilet transfer with CGA OT Short Term Goal 2 (Week 1): Pt will correctly orient his shirt without cues and with 1st presentation OT Short Term Goal 3 (Week 1): Pt will complete LB dressing with Min A sit<stand   Skilled Therapeutic Interventions/Progress Updates:    [name] at time of session semireclined in bed agreeable to OT session, wanting to take a shower. No pain. Supine > sit Supervision and ambulated with RW to bathroom CGA/close supervision, transferred to toilet in same manner and doffed clothes here. (+) for urine, no BM. Ambulatory transfer to shower with cues for technique CGA. Performed UB/LB bathing CGA overall, frequent cues for when to hold grab bar and sit for safety but pt prefers standing for most tasks. Dried off in same manner, encouraged figure four to dry feet. Ambulated shower > bed CGA/close supervision and donned shirt set up, brief set up like underwear and pants Min A to thread. Provided nectar thick cranberry juice, provided full supervision. Pt in bed resting with alarm on call bell in reach    Therapy Documentation Precautions:  Precautions Precautions: Fall Precaution Comments: Aphasic, HOH, legally blind in L eye. Restrictions Weight Bearing Restrictions: No     Therapy/Group: Individual Therapy  [name] 02/06/2021, 2:01 PM

## 2021-02-06 NOTE — Progress Notes (Signed)
Physical Therapy Session Note  Patient Details  Name: Douglas Walker MRN: [id number] Date of Birth: 04/05/1925  Today's Date: 02/06/2021 PT Individual Time: 0800-0900 PT Individual Time Calculation (min): 60 min   Short Term Goals: Week 1:  PT Short Term Goal 1 (Week 1): pt to demonstrate supervision supine<>sit PT Short Term Goal 2 (Week 1): pt to demonstrate CGA sit<>stand with LRAD PT Short Term Goal 3 (Week 1): pt to demonstrate gait with LRAD for 200' at [facility]      Skilled Therapeutic Interventions/Progress Updates:    pt received in sitting edge of bed and agreeable to therapy however wanting to finish breakfast. Pt currently full supervision, PT to assist with needs during this meal time, pt required intermittent min A for finding food, made more therapeutic with encouraging pt to scan tray, attempt to feed self, and VC for smaller bites and fully clearing mouth before taking additional bites. Pt then directed in multiple Sit to stand throughout session CGA to complete to Rolling walker consistently. Pt requesting to wash up, get dressed and then leave room for therapy. Pt directed in dynamic standing balance with VC for no UE support at sink with PT encouraging pt to reach in and outside his BOS for items for washing up to further challenge balance, pt able to complete face washing, hair combing setup A and CGA for balance training with reaching. Pt directed in donning pants in sitting min A and CGA to stand and pull over hips without external support and min A for donning shirt in standing. Pt required max A for washing back and applying anti itch cream. Pt then directed in gait training with Rolling walker for 500' CGA, VC and visual cues for trunk extension, walker proximity, and increased stride length. Pt requested to return to bed at end of session, CGA to complete sitting transfer and sit>supine. Pt left in supine, HOB elevated, All needs in reach and in good condition. Call light in  hand.  And alarm set.   Therapy Documentation Precautions:  Precautions Precautions: Fall Precaution Comments: Aphasic, HOH, legally blind in L eye. Restrictions Weight Bearing Restrictions: No General:   Vital Signs:   Pain:   Mobility:   Locomotion :    Trunk/Postural Assessment :    Balance:   Exercises:   Other Treatments:      Therapy/Group: Individual Therapy  [name] 02/06/2021, 9:50 AM

## 2021-02-06 NOTE — Progress Notes (Signed)
Physical Therapy Session Note  Patient Details  Name: Douglas Walker MRN: [id number] Date of Birth: 1925/10/14  Today's Date: 02/06/2021 PT Individual Time: 1100-1130 PT Individual Time Calculation (min): 30 min   Short Term Goals: Week 1:  PT Short Term Goal 1 (Week 1): pt to demonstrate supervision supine<>sit PT Short Term Goal 2 (Week 1): pt to demonstrate CGA sit<>stand with LRAD PT Short Term Goal 3 (Week 1): pt to demonstrate gait with LRAD for 200' at [facility]  Skilled Therapeutic Interventions/Progress Updates:    pt received in bed sleeping and aroused to name and agreeable to therapy. Pt reported headache but did not rank. Pt directed in supine>sit HOB slightly elevated SBA, reported he needed to use the restroom, Sit to stand with Rolling walker CGA with good safety awareness and technique, directed in gait training with Rolling walker for 15' to toilet, CGA for transfer and able to void bladder. Pt unaware however that he had urinated slightly in the floor and on his socks, directed in doffing socks and donning clean pair max A. Pt CGA for pants management. Pt directed in gait training with Rolling walker to sink CGA for hand washing. Pt then directed in gait training with Rolling walker 250' CGA with VC for trunk extension and to maintain walker distance closer to him for safety. Pt directed in ascending and descending x12 stairs with B handrails for improved dynamic standing balance and BLE strengthening. Pt then directed in gait training back to room with Rolling walker 250' CGA. Pt requested to return to bed, CGA and NCT present for bladder scan. Pt left in bed, with NCT, All needs in reach and in good condition. Call light in hand.    Therapy Documentation Precautions:  Precautions Precautions: Fall Precaution Comments: Aphasic, HOH, legally blind in L eye. Restrictions Weight Bearing Restrictions: No General:   Vital Signs:   Pain:   Mobility:   Locomotion :     Trunk/Postural Assessment :    Balance:   Exercises:   Other Treatments:      Therapy/Group: Individual Therapy  [name] 02/06/2021, 11:30 AM

## 2021-02-06 NOTE — Progress Notes (Signed)
Pt refusing cpap at this time. RT will continue to monitor as needed.

## 2021-02-06 NOTE — Patient Care Conference (Signed)
Inpatient RehabilitationTeam Conference and Plan of Care Update Date: 02/06/2021   Time: 11:33 AM    Patient Name: Douglas Walker      Medical Record Number: [id number]  Date of Birth: [birth year] Sex: Male         Room/Bed: 4M08C/4M08C-01 Payor Info: Payor: MEDICARE / Plan: MEDICARE PART A AND B / Product Type: *No Product type* /    Admit Date/Time:  02/01/2021  4:00 PM  Primary Diagnosis:  Left middle cerebral artery stroke [facility])  Hospital Problems: Principal Problem:   Left middle cerebral artery stroke (HCC) Active Problems:   Dysphagia, post-stroke   Chronic diastolic congestive heart failure (HCC)   Chronic pain syndrome    Expected Discharge Date: Expected Discharge Date: 02/15/21  Team Members Present: Physician leading conference: Dr. Courtney Heys Care Coodinator Present: Erlene Quan, BSW;Luis Sami Creig Hines, RN, BSN, [place] Nurse Present: Mohammed Kindle, RN PT Present: Stacy Gardner, PT OT Present: Lillia Corporal, OT PPS Coordinator present : Gunnar Fusi, SLP     Current Status/Progress Goal Weekly Team Focus  Bowel/Bladder   Pt continent of b/b, LBM 02/04/21  Pt will remain continent x2  Q2h toileting/PRN   Swallow/Nutrition/ Hydration   dys 1, nectar, full supervision  supervision least restrictive diet  trials thin and d2, repeat mbss, education, carryover swallow strategies   ADL's   Min LB dress, CGA bathing shower level for standing tasks, Supervision/CGA functional mobility and ADL transfers, UB ADLs Supervision, aphasic, some decreased safety  Supervision  LB ADLs with AE PRN, ADL retraining, endurance/general strengthening, functional mobility with RW   Mobility   CGA for bed mob and transfers with RW; min A-CGA for gait ~300'; standing balance with one UE support CGA  supervision bed mob, transfers, gait with RW  strengthening, balance training, gait training, tolerance to activity   Communication   Min  supervision  speech fluency and word finding    Safety/Cognition/ Behavioral Observations  Min  Supervisoin  selective attention, near baseline otherwise per son   Pain   Pt c/o generalized pain, PRN tylenol effectiveq  Pt will be free of pain  Assess pain Qshift/PRN   Skin   Pt has bilateral bruising to arms.  No further breakdon or infection  Assess skin Qshift/PRN     Discharge Planning:  Discharging home with family (son, daughter in law and grandchildren)   Team Discussion: Although patient is on nectar thick fluids, we still need to push fluids. MD witnessed Apnea episodes on acute and on rehab, CPAP ordered but patient is refusing. We need to try and explain to patient why he needs it. Full supervision with meals, push PO fluids. Last PVR showed 129cc, MD decided to give IVF. Continent B/B, had small bowel, to give Sorbitol. Going home with son and daughter-n-law. Patient on target to meet rehab goals: Not good with AD, doesn't understand how to use them. Decreased safety and awareness.has supervision goals. Contact guard for bed mobility and transfers with RW. Min assist-contact guard for gait about 300 ft. Supervision goals with RW. Speech reports he is near baseline, he has selective attention. Working on speech fluency and word fining. Has supervision goals.  *See Care Plan and progress notes for long and short-term goals.   Revisions to Treatment Plan:  MD ordered IVF.  Teaching Needs: Family education, medication management, transfer training, gait training, endurance management, safety awareness.  Current Barriers to Discharge: Inaccessible home environment, Decreased caregiver support, Home enviroment access/layout, Lack of/limited family support, Medication  compliance, Behavior and Nutritional means  Possible Resolutions to Barriers: Continue current medications, offer nutritional support, offer fluids, provide emotional support to patient and family.     Medical Summary Current Status: no wounds; close SBA-  doesn't drink a lot of nectar thick- will push PO; incontinent urine- very small-amounts -will give IVFs; very HOH; aphasic- LBM 2/6- small- need to go- unaware urinated on sock/floor  Barriers to Discharge: Decreased family/caregiver support;Home enviroment access/layout;Weight;Medical stability;Medication compliance;Other (comments)  Barriers to Discharge Comments: son/DIL are caregivers- needs CPAP- has sleep apnea- push this-due to apnea; Possible Resolutions to [employer] Focus: min A LB ADLs; limited safety awareness/aphasia; - SLP- have discharged him?; functionally doing well with aphasia- d/c 2/17   Continued Need for Acute Rehabilitation Level of Care: The patient requires daily medical management by a physician with specialized training in physical medicine and rehabilitation for the following reasons: Direction of a multidisciplinary physical rehabilitation program to maximize functional independence : Yes Medical management of patient stability for increased activity during participation in an intensive rehabilitation regime.: Yes Analysis of laboratory values and/or radiology reports with any subsequent need for medication adjustment and/or medical intervention. : Yes   I attest that I was present, lead the team conference, and concur with the assessment and plan of the team.   Cristi Loron 02/06/2021, 6:57 PM

## 2021-02-06 NOTE — Progress Notes (Signed)
Patient ID: Douglas Walker, male   DOB: 1925/08/08, 87 y.o.   MRN: [id number]  Team Conference Report to Patient/Family  Team Conference discussion was reviewed with the patient and caregiver, including goals, any changes in plan of care and target discharge date.  Patient and caregiver express understanding and are in agreement.  The patient has a target discharge date of 02/15/21.  [name] 02/06/2021, 12:21 PM

## 2021-02-07 LAB — BASIC METABOLIC PANEL
Anion gap: 11 (ref 5–15)
BUN: 27 mg/dL — ABNORMAL HIGH (ref 8–23)
CO2: 23 mmol/L (ref 22–32)
Calcium: 8.8 mg/dL — ABNORMAL LOW (ref 8.9–10.3)
Chloride: 104 mmol/L (ref 98–111)
Creatinine, Ser: 1.29 mg/dL — ABNORMAL HIGH (ref 0.61–1.24)
GFR, Estimated: 52 mL/min — ABNORMAL LOW (ref >60)
Glucose, Bld: 92 mg/dL (ref 70–99)
Potassium: 3.9 mmol/L (ref 3.5–5.1)
Sodium: 138 mmol/L (ref 135–145)

## 2021-02-07 MED ORDER — TRIAMCINOLONE ACETONIDE 0.1 % EX LOTN
TOPICAL_LOTION | Freq: Three times a day (TID) | CUTANEOUS | Status: DC
  Administered 2021-02-08 – 2021-02-12 (×2): 1 via TOPICAL
  Filled 2021-02-07: qty 60

## 2021-02-07 MED ORDER — SALINE SPRAY 0.65 % NA SOLN
1.0000 | NASAL | Status: DC | PRN
  Filled 2021-02-07: qty 44

## 2021-02-07 MED ORDER — HYDROCORTISONE 1 % EX LOTN: TOPICAL_LOTION | Freq: Three times a day (TID) | CUTANEOUS | Status: DC

## 2021-02-07 NOTE — Progress Notes (Signed)
Physical Therapy Session Note  Patient Details  Name: CLANCE BAQUERO MRN: [id number] Date of Birth: 05-27-1925  Today's Date: 02/07/2021 PT Individual Time: 0900-0930 PT Individual Time Calculation (min): 30 min   Short Term Goals: Week 1:  PT Short Term Goal 1 (Week 1): pt to demonstrate supervision supine<>sit PT Short Term Goal 2 (Week 1): pt to demonstrate CGA sit<>stand with LRAD PT Short Term Goal 3 (Week 1): pt to demonstrate gait with LRAD for 200' at [place] Therapeutic Interventions/Progress Updates:   Patient received reclined in bed, agreeable to PT. He denies pain when asked. Patient able to come sit edge of bed with supervision and sit <> stand with CGA using RW. Patient ambulating to ortho gym with RW and CGA. Forward flexed posture and decreased B stride length noted. Patient at times suddenly increasing speed and then decreasing speed, but no LOB noted. Patient completing 29mins on NuStep with B LE only for increased reciprocal coordination. Good carryover to gait noted with ambulation back to room with RW and CGA. Increased and consistent stride length noted. Patient returning to bed, per his request. Bed alarm on, call light within reach.    Therapy Documentation Precautions:  Precautions Precautions: Fall Precaution Comments: Aphasic, HOH, legally blind in L eye. Restrictions Weight Bearing Restrictions: No    Therapy/Group: Individual Therapy  Karoline Caldwell, PT, DPT, CBIS  02/07/2021, 7:44 AM

## 2021-02-07 NOTE — Progress Notes (Signed)
Physical Therapy Session Note  Patient Details  Name: Douglas Walker MRN: [id number] Date of Birth: [birth year]  Today's Date: 02/07/2021 PT Individual Time: 1339-1420 PT Individual Time Calculation (min): 41 min   Short Term Goals: Week 1:  PT Short Term Goal 1 (Week 1): pt to demonstrate supervision supine<>sit PT Short Term Goal 2 (Week 1): pt to demonstrate CGA sit<>stand with LRAD PT Short Term Goal 3 (Week 1): pt to demonstrate gait with LRAD for 200' at [place] Therapeutic Interventions/Progress Updates:    Patient in supine with daughter in law in the room.  Patient HOH so used gestures and demonstration as well as increased volume throughout session for improved understanding.  Patient performed supine to sit with S.  Requesting to toilet in bathroom.  Sit to stand to RW with CGA.  Ambulated to bathroom with RW and min A.  Patient doffed and donned brief and pants with CGA to S.  Performed toilet transfer with CGA.  Patient ambulated to therapy gym.  In parallel bars gait and side stepping through floor ladder to work on foot clearance, stride length, hip strength and coordination.  Bilateral UE support throughout.  Patient performed standing balance at rebounder to toss and catch ball without UE support with CGA to min A.  Patient ambulated to room with RW and CGA.  Seated EOB noted rash on back and areas of blood on t-shirt.  RN informed and brought cream and applied to his back.  Daughter in law provided new t-shirt and pt donned with min a.  Sit to supine with S HOB elevated.  Left with bed alarm active, call bell in reach and daughter in law in the room.   Therapy Documentation Precautions:  Precautions Precautions: Fall Precaution Comments: Aphasic, HOH, legally blind in L eye. Restrictions Weight Bearing Restrictions: No Pain: Pain Assessment Faces Pain Scale: No hurt    Therapy/Group: Individual Therapy  Reginia Naas  [place], [place] 02/07/2021, 5:28 PM

## 2021-02-07 NOTE — Progress Notes (Signed)
Pt refused to wear CPAP. RN educated pt. Pt refused.

## 2021-02-07 NOTE — Progress Notes (Signed)
Patient refused CPAP for the night  

## 2021-02-07 NOTE — Progress Notes (Signed)
[facility] PHYSICAL MEDICINE & REHABILITATION PROGRESS NOTE   Subjective/Complaints:  Pt refusing CPAP in spite of [place] talking to him.   Per NT< not drinking great-  Has a rash on back, so nursing started hypoallergenic sheets- which have helped.   Per nursing notes, pt had nose bleed this AM- didn't say how long lasted, but was stopped with pressure.     ROS:l limited due to aphasia/cognition/HOH  Objective:   No results found. Recent Labs    02/06/21 0452  WBC 4.7  HGB 10.7*  HCT 31.9*  PLT 112*   Recent Labs    02/06/21 0452 02/07/21 0513  NA 141 138  K 4.3 3.9  CL 107 104  CO2 24 23  GLUCOSE 97 92  BUN 31* 27*  CREATININE 1.21 1.29*  CALCIUM 9.0 8.8*    Intake/Output Summary (Last 24 hours) at 02/07/2021 0926 Last data filed at 02/07/2021 0300 Gross per 24 hour  Intake 1415.42 ml  Output --  Net 1415.42 ml        Physical Exam: Vital Signs Blood pressure 129/73, pulse 69, temperature 97.7 F (36.5 C), resp. rate 18, height 5\' 9"  (1.753 m), weight 94.2 kg, SpO2 94 %. Constitutional: awake, alert, eating slower, more judicious speed; NT in room, needed hearing aids to hear me, NAD HENT: Normocephalic.  Atraumatic. Hearing aids put in Eyes: EOMI. No discharge. Cardiovascular: RRR - no JVD Respiratory: CTA B/L- no W/R/R- good air movement GI: Soft, NT, ND, (+)BS  Skin: Warm and dry. But has flat pruritic rash on back- some dried blood on shirt- scattered blood spots;   Scattered ecchymosis Psych: aphasic; flat affect Musc: No edema in extremities.  No tenderness in extremities. Neuro: Alert, HOH Facial tremor constant jaw tremors Motor: LUE/LLE: 4+/5 RUE/RLE: Appears to be 4/5 proximal distal  Assessment/Plan: 1. Functional deficits which require 3+ hours per day of interdisciplinary therapy in a comprehensive inpatient rehab setting.  Physiatrist is providing close team supervision and 24 hour management of active medical problems listed  below.  Physiatrist and rehab team continue to assess barriers to discharge/monitor patient progress toward functional and medical goals  Care Tool:  Bathing    Body parts bathed by patient: Right arm,Left arm,Chest,Abdomen,Front perineal area,Buttocks,Right upper leg,Left upper leg,Face,Right lower leg,Left lower leg   Body parts bathed by helper: Right lower leg,Left lower leg (due to tight space in shower, was able to wash feet sitting at the sink)     Bathing assist Assist Level: Contact Guard/Touching assist     Upper Body Dressing/Undressing Upper body dressing   What is the patient wearing?: Pull over shirt    Upper body assist Assist Level: Contact Guard/Touching assist    Lower Body Dressing/Undressing Lower body dressing      What is the patient wearing?: Incontinence brief,Pants     Lower body assist Assist for lower body dressing: Minimal Assistance - Patient > 75% (with brief made into underwear)     Toileting Toileting Toileting Activity did not occur (Clothing management and hygiene only): N/A (no void or bm)  Toileting assist Assist for toileting: Contact Guard/Touching assist     Transfers Chair/bed transfer  Transfers assist     Chair/bed transfer assist level: Supervision/Verbal cueing     Locomotion Ambulation   Ambulation assist      Assist level: Supervision/Verbal cueing Assistive device: Walker-rolling Max distance: 300 ft   Walk 10 feet activity   Assist     Assist level: Supervision/Verbal  cueing Assistive device: Walker-rolling   Walk 50 feet activity   Assist    Assist level: Supervision/Verbal cueing Assistive device: Walker-rolling    Walk 150 feet activity   Assist    Assist level: Supervision/Verbal cueing Assistive device: Walker-rolling    Walk 10 feet on uneven surface  activity   Assist     Assist level: Minimal Assistance - Patient > 75% Assistive device: Horticulturist, commercial Will patient use wheelchair at discharge?: No             Wheelchair 50 feet with 2 turns activity    Assist            Wheelchair 150 feet activity     Assist          Blood pressure 129/73, pulse 69, temperature 97.7 F (36.5 C), resp. rate 18, height 5\' 9"  (1.753 m), weight 94.2 kg, SpO2 94 %.    Medical Problem List and Plan: 1.  Right hemiparesis and aphasia secondary to left cortical infarct/embolic left MCA  Continue CIR 2.  Antithrombotics: -DVT/anticoagulation: Eliquis             -antiplatelet therapy: N/A 3. Pain Management: Topamax 50 mg twice daily, Lyrica 25 mg nightly, Cymbalta 20 mg daily- c/o sides hurting- will add tramadol prn for severe pain  2/7- on pain regimen- doing well- con't regimen  2/8- wants tylenol- "hurts all over"- will let nursing knw- con't regimen-  4. Mood: Provide emotional support             -antipsychotic agents: N/A 5. Neuropsych: This patient is capable of making decisions on his own behalf. 6. Skin/Wound Care: Routine skin checks 7. Fluids/Electrolytes/Nutrition: Routine in and outs. 8.  PAF/pacemaker.  Cardiac rate controlled.  Continue Eliquis 9.  CKD stage IIIB    Creatinine 1.37 on 2/4, labs ordered for Monday  2/7- will order labs for Tuesday 2/8- since don't have today  2/8- Cr 1.01 and BUN 31- slightly dry- but on nectar thick liquids- will recheck Thursday and if rising more, will give IVFs.  2/9- actually started on IVFs 2/8- Cr up slightly- BUN down slightly to 27- will write for IVFs at night for now.   Encourage fluids 10.  Diastolic congestive heart failure.  Patient on Lasix 60 mg daily prior to admission and has been on hold since admission.  Resume as needed monitor for any signs of fluid overload Filed Weights   02/05/21 0500 02/06/21 0500 02/07/21 0500  Weight: 91.1 kg 93.6 kg 94.2 kg   Daily weights ordered 11.  Essential tremor.  Mysoline 250 mg twice daily- mainly  RUE and jaw  Persistent on 2/5  2/8- tremors slightly better this AM-c on't regimen  2/9- tremors worse this AM- con't regimen 12.  BPH.  Flomax 0.4 mg twice daily, Proscar 5 mg daily 13.  Legally blind.  Patient independent ADLs prior to admission 14.  Hypothyroidism: Synthroid 15.  History of ESBL 07/2020.  Contact precautions for life per hospital policy.  16. Apnea episodes  2/4- dropped sats to 84% during apnea episode yesterday- still having apnea- will try and get CPAP for pt- hopefully would wear.   2/8- pt refusing- says doesn't need- will try and get to wear  2/9- still refusing 17.  Post stroke dysphagia  D1 nectars, advance diet as tolerated  2/7- is doing well with intake including nectar thick liquids- con't regimen  2/8- BUN 31-  per staff drinking well, but need to push more fluids PO  2/9- will order night time IVFs since not drinking nectar thick liquids well 18. Constipation  2/7- LBM Friday 2/4- will try Miralax today and if not effective, will give Sorbitol.   2/8- LBM 2/6 small- will give Sorbitol today.   2/9- LBM last night after sorbitol  LOS: 6 days A FACE TO FACE EVALUATION WAS PERFORMED  Douglas Walker 02/07/2021, 9:26 AM

## 2021-02-07 NOTE — Progress Notes (Signed)
Occupational Therapy Session Note  Patient Details  Name: Douglas Walker MRN: [id number] Date of Birth: [birth year]  Today's Date: 02/07/2021 OT Individual Time: 5259-1028 OT Individual Time Calculation (min): 57 min    Short Term Goals: Week 1:  OT Short Term Goal 1 (Week 1): Pt will complete toilet transfer with CGA OT Short Term Goal 2 (Week 1): Pt will correctly orient his shirt without cues and with 1st presentation OT Short Term Goal 3 (Week 1): Pt will complete LB dressing with Min A sit<stand   Skilled Therapeutic Interventions/Progress Updates:    [name] at time of session supine in bed just finishing PT session, hand off to OT. Agreeable to OT session, no pain, declined ADL as he had just showered and changed clothes yesterday. Requesting more cranberry juice at beginning of session which was provided and thickened to nectar thick, provided supervision. Supine > sit Supervision and ambulated to bathroom in same manner with assist for IV pole, 3/3 toilet tasks Supervision with extended time to complete. Walked close supervision/CGA with RW room <> gym and performed dynamic standing tasks at [facility] for 3 minute intervals no fatigue, RUE only with 84% accuracy and BUE with 70% accuracy, all to improve coordination and standing balance. Sit > supine/semireclined with Supervision. Call bell in reach all needs met.   Therapy Documentation Precautions:  Precautions Precautions: Fall Precaution Comments: Aphasic, HOH, legally blind in L eye. Restrictions Weight Bearing Restrictions: No     Therapy/Group: Individual Therapy  [name] 02/07/2021, 11:28 AM

## 2021-02-07 NOTE — Progress Notes (Signed)
Pt developed a nose bleed. RN held pressure until the bleeding stopped. RN will notify day shift nurse and PA on call. Pt stable at this time. RN will continue to monitor.

## 2021-02-07 NOTE — Progress Notes (Signed)
Speech Language Pathology Daily Session Note  Patient Details  Name: Douglas Walker MRN: [id number] Date of Birth: 08-20-1925  Today's Date: 02/07/2021 SLP Individual Time: 1430-1500 SLP Individual Time Calculation (min): 30 min  Short Term Goals: Week 1: SLP Short Term Goal 1 (Week 1): Pt will consume therapeutic trials of thin H2O with minimal overt s/sx X2 prior to MBS. SLP Short Term Goal 2 (Week 1): Pt will consume trials of dysphagia 2 (minced/ground) solids with efficienct mastication and oral clearance X2 prior to advancement. SLP Short Term Goal 3 (Week 1): Pt will demonstrate ability to express his thoughts and needs with overall Min A cues for use of strategies for word finding and fluency. SLP Short Term Goal 4 (Week 1): Pt will detect and correct verbal errors (including perseverations) with Min A cues. SLP Short Term Goal 5 (Week 1): Pt will selectively attend to functional tasks for 5 minute intervals with Min A cues for redirection.  Skilled Therapeutic Interventions: Skilled treatment session focused on communication and dysphagia goals. Upon arrival, patient was awake in bed but appeared lethargic. Patient's daughter-in-law present and asking appropriate question regarding swallowing function. SLP provided education regarding clinical reasoning for current diet, goals for [facility] tomorrow and possible outcomes. She verbalized understanding. Patient requested cranberry juice and consumed 4 oz via cup without overt s/s of aspiration. Patient also required more than a resonable amount of time and overall Mod A multimodal cues for word-finding while attempting to express wants/needs at the phrase and sentence level. SLP provided education to the patient and his daughter-in-law regarding how to maximize verbal expression such as minimize verbal expression when fatigued, take a breath and try again in order to reduce frustration as this only makes communication more difficult and the  importance of not attempting to communicate while performing another task. Both verbalized understanding but will need reinforcement. Patient left upright in bed with alarm on and all needs within reach. Continue with current plan of care.      Pain No/Denies Pain   Therapy/Group: Individual Therapy  [name] 02/07/2021, 3:45 PM

## 2021-02-07 NOTE — Progress Notes (Signed)
Physical Therapy Session Note  Patient Details  Name: Douglas Walker MRN: [id number] Date of Birth: 19-Jun-1925  Today's Date: 02/07/2021 PT Individual Time: 1105-1210 PT Individual Time Calculation (min): 65 min   Short Term Goals: Week 1:  PT Short Term Goal 1 (Week 1): pt to demonstrate supervision supine<>sit PT Short Term Goal 2 (Week 1): pt to demonstrate CGA sit<>stand with LRAD PT Short Term Goal 3 (Week 1): pt to demonstrate gait with LRAD for 200' at [place] Therapeutic Interventions/Progress Updates: Pt presented in bed sleeping but easily aroused, requiring minimal encouragement to participate dur to fatigue. Performed bed mobility with supervision and use of bed features. Pt ambulated to rehab gym with RW and CGA to close S. Pt participated in standing balance activities for dynamic balance and general conditioning. Pt participated in dynamic reaching initially on level tile reaching for clothespins and attempting to place on basketball net however pt with difficulty accurately placing on net therefore PTA changed to horseshoes on basketball rim. PTA added challenge of placing single LE on 4in step and performing same activity with pt alternating arms for reaching. Lastly pt placed horseshoes while standing on Airex with intermittent posterior LOB requiring minA from PTA for correction. Pt also participated in static stand on Airex and alternating shoulder flexion to 90 on Airex x 20 with intermittent posterior lean near LOB with pt able to correct by holding onto RW.  Pt also participated in use of rebounder with 1Kg ball x 20 standing on level tile. Pt able to tolerate performing without AD and maintaining CGA with no overt LOB. Pt performed STS x 10 while holding onto red physioball x 15 with pt demonstrating more reliance on bracing BLE against mat and decreased eccentric control with fatigue. Pt ambulated back to room at end of session in same manner as prior and pt going to  bathroom prior to returning to bed demonstrating supervision with LB clothing management and toilet transfers. Pt returned to bed and performed sit to supine with supervision. Pt requesting cranberry juice and PTA providing and providing supervision with no over s/s coughing/aspiration. Pt left in bed at end of session with bed alarm on, call bell within reach and DIL present.      Therapy Documentation Precautions:  Precautions Precautions: Fall Precaution Comments: Aphasic, HOH, legally blind in L eye. Restrictions Weight Bearing Restrictions: No General:   Vital Signs: Therapy Vitals Temp: 98 F (36.7 C) Temp Source: Oral Pulse Rate: 70 Resp: 16 BP: 127/66 Patient Position (if appropriate): Lying Oxygen Therapy SpO2: 99 % O2 Device: Room Air Pain:   Mobility:   Locomotion :    Trunk/Postural Assessment :    Balance:   Exercises:   Other Treatments:      Therapy/Group: Individual Therapy  Felicidad Sugarman 02/07/2021, 4:12 PM

## 2021-02-08 ENCOUNTER — Inpatient Hospital Stay (HOSPITAL_COMMUNITY): Payer: Medicare Other

## 2021-02-08 MED ORDER — DULOXETINE HCL 20 MG PO CPEP
40.0000 mg | ORAL_CAPSULE | Freq: Every day | ORAL | Status: DC
  Administered 2021-02-08 – 2021-02-15 (×8): 40 mg via ORAL
  Filled 2021-02-08 (×7): qty 2

## 2021-02-08 NOTE — Progress Notes (Signed)
Occupational Therapy Session Note  Patient Details  Name: Douglas Walker MRN: [id number] Date of Birth: 10-19-1925  Today's Date: 02/08/2021 OT Individual Time: 1301-1400  And 1550-1643 OT Individual Time Calculation (min): 59 min and 53 min   Short Term Goals: Week 1:  OT Short Term Goal 1 (Week 1): Pt will complete toilet transfer with CGA OT Short Term Goal 2 (Week 1): Pt will correctly orient his shirt without cues and with 1st presentation OT Short Term Goal 3 (Week 1): Pt will complete LB dressing with Min A sit<stand   Skilled Therapeutic Interventions/Progress Updates:    Session 1: [name] at time of session supine in bed resting agreeable to OT session, no c/o pain. Supine > sit supervision and ambulated to sink surface where he completed shaving with electric razor from home with set up/suprevision for thoroughness. Pt performed seated as he does in home environment with attending to both R/L sides. Walked room <> gym close supervision/CGA with RW and performed dynamic standing tasks with incorporating word finding to improve speech patterns and word retrieval for words up/down/left/right with 50% accuracy during activity. Also performed matching activity with colors red, green, yellow, and blue using clothes pins for NMR of RUE while saying colors out loud. Extended time for word finding and approx 60-75% accuracy. Once in room, sit > supine Supervision. Alarm on call bell in reach.   Session 2: [name] at time of session supine in bed sleeping but easily aroused and agreeable to OT session. Time spent at beginning of session changing batteries for hearing aides, but pt continued to state they were "bad" and not working so declined to wear. Ambulated > bathroom CGA/supervision and transfer/toilet tasks same manner. RUE tremor present when reaching back for handle as well. Walked to gym CGA/supervision with RW, cues to remain inside walker for better support. Performed dynamic  seated activity with reaching and crossing midline for colored bean bags, having to say color prior to crossing midline with extended time required for word retrieval but approx 60-75% accuracy with familiar colors. Easily frustrated at times with aphasia but encouraged. Engaged in casual conversation as well for familiar names, family members, birthdays, etc to encourage verbal communication. Walked back to room in same manner as above, using toilet CGA overall before walking to bed. Sit > supine supervision, alarm on call bell in reach.   Therapy Documentation Precautions:  Precautions Precautions: Fall Precaution Comments: Aphasic, HOH, legally blind in L eye. Restrictions Weight Bearing Restrictions: No    Therapy/Group: Individual Therapy  [name] 02/08/2021, 12:44 PM

## 2021-02-08 NOTE — Progress Notes (Signed)
Patient ID: RAEL YO, male   DOB: 05/03/1925, 87 y.o.   MRN: [id number]   [facility] agencies presented to patient son. Patient previously set up with [facility]. SW will reach out to have agency resume care at D/C.  [place], [place]

## 2021-02-08 NOTE — Progress Notes (Signed)
Modified Barium Swallow Progress Note  Patient Details  Name: BARY LIMBACH MRN: [id number] Date of Birth: [birth year]  Today's Date: 02/08/2021  Modified Barium Swallow completed.  Full report located under Chart Review in the Imaging Section.  Brief recommendations include the following:  Clinical Impression  Patient presents with a moderate sensory-motor based dysphagia resulting in silent aspiration of thin liquids and nectar thick liquids. Oral phase was mildly impaired with decreased mastication of regular solids and mild delay with oral transit of puree and regular solid boluses. He exhibited swallow initiation delay to level of vallecular sinus with puree solids,regular solids and honey thick liquids and delays to pyriform sinus with thin and nectar thick liquids. Aspiration events occured with thin liquids and nectar thick liquids during swallow secondary to very poor epiglottic deflection and airway closure.  He exhibited mild aspiration or thin liquids which reduced to trace with chin tuck posture. Aspiration with nectar thick liquids occured slightly less than 50% of the time, but when aspiration event occured, amount of bolus aspirated was moderate. Chin tuck posture prevented aspiration and penetration with cup sips of nectar thick liquids. Patient unable to clear any aspirate with cued cough. Trace residuals remained briefly in posterior pharyngeal wall with initial swallow of regular solids, but trace to no residuals remained in pharynx post swallows of puree solids, nectar thick liquids and thin liquids.   Swallow Evaluation Recommendations       SLP Diet Recommendations: Dysphagia 2 (Fine chop) solids   Liquid Administration via: Cup;No straw   Medication Administration: Whole meds with puree   Supervision: Full assist for feeding;Staff to assist with self feeding   Compensations: Minimize environmental distractions;Slow rate;Small sips/bites;Chin tuck   Postural  Changes: Seated upright at 90 degrees   Oral Care Recommendations: Oral care BID      Sonia Baller, MA, CCC-SLP Speech Therapy

## 2021-02-08 NOTE — Progress Notes (Signed)
Physical Therapy Session Note  Patient Details  Name: Douglas Walker MRN: [id number] Date of Birth: [birth year]  Today's Date: 02/08/2021 PT Individual Time: 1101-1158 PT Individual Time Calculation (min): 57 min   Short Term Goals: Week 1:  PT Short Term Goal 1 (Week 1): pt to demonstrate supervision supine<>sit PT Short Term Goal 2 (Week 1): pt to demonstrate CGA sit<>stand with LRAD PT Short Term Goal 3 (Week 1): pt to demonstrate gait with LRAD for 200' at [place] Therapeutic Interventions/Progress Updates:    pt received in bed and agreeable to therapy. Son present. Pt directed in supine>sit with HOB elevated at SBA. Pt directed in Sit to stand to Rolling walker SBA and directed in gait to restroom at [facility] and pt (+) void of bladder with CGA for clothing management. Pt directed in gait training with Rolling walker to ortho gym 150' CGA. Pt directed in car transfer at [facility] with Rolling walker to initiate and VC to complete. Pt directed in furniture transfers from sofa and recliner CGA to complete with Rolling walker Pt then directed in gait training to day room  200' CGA VC for trunk extension. Pt directed in NMRE with standing dynamic balance at CGA, one small LOB L laterally min  A  To right, 3x10 bean bags to 6' target distance. Pt reported he needed to have BM, gait training back to room, CGA 350' and SBA toilet transfers, max A for pericare. Pt then requested to return to bed, SBA. Pt left in bed, alarm set, All needs in reach and in good condition. Call light in hand.    Therapy Documentation Precautions:  Precautions Precautions: Fall Precaution Comments: Aphasic, HOH, legally blind in L eye. Restrictions Weight Bearing Restrictions: No General:   Vital Signs:   Pain: Pain Assessment Pain Scale: 0-10 Pain Score: 6  Pain Location: Head Pain Descriptors / Indicators: Headache Patients Stated Pain Goal: 3 Pain Intervention(s): Medication (See eMAR) Mobility:    Locomotion :    Trunk/Postural Assessment :    Balance:   Exercises:   Other Treatments:      Therapy/Group: Individual Therapy  [name] 02/08/2021, 12:20 PM

## 2021-02-08 NOTE — Progress Notes (Signed)
Douglas Walker PROGRESS NOTE   Subjective/Complaints:  Pt said tried CPAP- didn't go well at all- will NOT use again- asked me to d/c order- since he will not use.  Also noted, his wife has apnea episodes as well- neither use CPAP.   Feels good- is trying to drink nectar thick liquids- doesn't want IVFs at night, unless f/u labs show he NEEDS it.     Douglas Walker:Douglas Walker due to aphasia/HOH/cognition  Objective:   No results found. Recent Labs    02/06/21 0452  WBC 4.7  HGB 10.7*  HCT 31.9*  PLT 112*   Recent Labs    02/06/21 0452 02/07/21 0513  NA 141 138  K 4.3 3.9  CL 107 104  CO2 24 23  GLUCOSE 97 92  BUN 31* 27*  CREATININE 1.21 1.29*  CALCIUM 9.0 8.8*    Intake/Output Summary (Last 24 hours) at 02/08/2021 0855 Last data filed at 02/07/2021 1856 Gross per 24 hour  Intake 840 ml  Output --  Net 840 ml        Physical Exam: Vital Signs Blood pressure 134/66, pulse 69, temperature 98.1 F (36.7 C), temperature source Oral, resp. rate 16, height 5\' 9"  (1.753 m), weight 93.9 kg, SpO2 94 %. Constitutional: awake, EOB, with NT in room; eating breakfast, NAD HENT: Normocephalic.  Atraumatic. Hearing aids in- can hear better Eyes: EOMI. No discharge. Cardiovascular: RRR- no JVD Respiratory: CTA B/L- no W/R/R- good air movement GI: Soft, NT, ND, (+)BS  Skin: Warm and dry. But has flat pruritic rash on back- some dried blood on shirt- scattered blood spots;   Scattered ecchymosis Psych: tearful due to aphasia issues Musc: No edema in extremities.  No tenderness in extremities. Neuro: HOH- alert Facial tremor- worse this AM Motor: LUE/LLE: 4+/5 RUE/RLE: Appears to be 4/5 proximal distal  Assessment/Plan: 1. Functional deficits which require 3+ hours per day of interdisciplinary therapy in a comprehensive inpatient rehab setting.  Physiatrist is providing close team supervision and 24 hour management of active medical problems listed  below.  Physiatrist and rehab team continue to assess barriers to discharge/monitor patient progress toward functional and medical goals  Care Tool:  Bathing    Body parts bathed by patient: Right arm,Left arm,Chest,Abdomen,Front perineal area,Buttocks,Right upper leg,Left upper leg,Face,Right lower leg,Left lower leg   Body parts bathed by helper: Right lower leg,Left lower leg (due to tight space in shower, was able to wash feet sitting at the sink)     Bathing assist Assist Level: Contact Guard/Touching assist     Upper Body Dressing/Undressing Upper body dressing   What is the patient wearing?: Pull over shirt    Upper body assist Assist Level: Contact Guard/Touching assist    Lower Body Dressing/Undressing Lower body dressing      What is the patient wearing?: Incontinence brief,Pants     Lower body assist Assist for lower body dressing: Minimal Assistance - Patient > 75% (with brief made into underwear)     Toileting Toileting Toileting Activity did not occur (Clothing management and hygiene only): N/A (no void or bm)  Toileting assist Assist for toileting: Contact Guard/Touching assist     Transfers Chair/bed transfer  Transfers assist     Chair/bed transfer assist level: Supervision/Verbal cueing     Locomotion Ambulation   Ambulation assist      Assist level: Contact Guard/Touching assist Assistive device: Walker-rolling Max distance: 150   Walk 10 feet activity   Assist  Assist level: Contact Guard/Touching assist Assistive device: Walker-rolling   Walk 50 feet activity   Assist    Assist level: Contact Guard/Touching assist Assistive device: Walker-rolling    Walk 150 feet activity   Assist    Assist level: Contact Guard/Touching assist Assistive device: Walker-rolling    Walk 10 feet on uneven surface  activity   Assist     Assist level: Minimal Assistance - Patient > 75% Assistive device: Horticulturist, commercial Will patient use wheelchair at discharge?: No             Wheelchair 50 feet with 2 turns activity    Assist            Wheelchair 150 feet activity     Assist          Blood pressure 134/66, pulse 69, temperature 98.1 F (36.7 C), temperature source Oral, resp. rate 16, height 5\' 9"  (1.753 m), weight 93.9 kg, SpO2 94 %.    Medical Problem List and Plan: 1.  Right hemiparesis and aphasia secondary to left cortical infarct/embolic left MCA  Continue CIR 2.  Antithrombotics: -DVT/anticoagulation: Eliquis             -antiplatelet therapy: N/A 3. Pain Management: Topamax 50 mg twice daily, Lyrica 25 mg nightly, Cymbalta 20 mg daily- c/o sides hurting- will add tramadol prn for severe pain  2/7- on pain regimen- doing well- con't regimen  2/8- wants tylenol- "hurts all over"- will let nursing know- con't regimen-  2/10- said hurting- wants tylenol this AM- con't regimen  4. Mood: Provide emotional support  2/10- will increase Duloxetine to 40 mg daily- - due to tearfulness- concerned about mood             -antipsychotic agents: N/A 5. Neuropsych: This patient is capable of making decisions on his own behalf. 6. Skin/Wound Care: Routine skin checks 7. Fluids/Electrolytes/Nutrition: Routine in and outs. 8.  PAF/pacemaker.  Cardiac rate controlled.  Continue Eliquis 9.  CKD stage IIIB    Creatinine 1.37 on 2/4, labs ordered for Monday  2/7- will order labs for Tuesday 2/8- since don't have today  2/8- Cr 1.01 and BUN 31- slightly dry- but on nectar thick liquids- will recheck Thursday and if rising more, will give IVFs.  2/9- actually started on IVFs 2/8- Cr up slightly- BUN down slightly to 27- will write for IVFs at night for now.   Encourage fluids  2/10- will recheck labs in AM- drinking nectar thick liquids, but only has 2 4-6 oz drinks on plate- needs more-  10.  Diastolic congestive heart failure.  Patient on Lasix 60 mg daily  prior to admission and has been on hold since admission.  Resume as needed monitor for any signs of fluid overload Filed Weights   02/06/21 0500 02/07/21 0500 02/08/21 0314  Weight: 93.6 kg 94.2 kg 93.9 kg   Daily weights ordered  2/10- weight stable - con't regimen 11.  Essential tremor.  Mysoline 250 mg twice daily- mainly RUE and jaw  Persistent on 2/5  2/8- tremors slightly better this AM-c on't regimen  2/9- tremors worse this AM- con't regimen 12.  BPH.  Flomax 0.4 mg twice daily, Proscar 5 mg daily 13.  Legally blind.  Patient independent ADLs prior to admission 14.  Hypothyroidism: Synthroid 15.  History of ESBL 07/2020.    2/10- taken off contact precautions per Infection control  16. Apnea episodes  2/4-  dropped sats to 84% during apnea episode yesterday- still having apnea- will try and get CPAP for pt- hopefully would wear.   2/8- pt refusing- says doesn't need- will try and get to wear  2/9- still refusing  2/10- will d/c CPAP- pt refuses to use, in spite of description of apnea episodes 17.  Post stroke dysphagia  D1 nectars, advance diet as tolerated  2/7- is doing well with intake including nectar thick liquids- con't regimen  2/8- BUN 31- per staff drinking well, but need to push more fluids PO  2/9- will order night time IVFs since not drinking nectar thick liquids well  2/10- pt refused night time IVFs- will recheck tomorrow and if worse, will then do night time IVFs 18. Constipation  2/7- LBM Friday 2/4- will try Miralax today and if not effective, will give Sorbitol.   2/8- LBM 2/6 small- will give Sorbitol today.   2/9- LBM last night after sorbitol  LOS: 7 days A FACE TO FACE EVALUATION WAS PERFORMED  Douglas Walker 02/08/2021, 8:55 AM

## 2021-02-08 NOTE — Progress Notes (Signed)
Patient vehemently refused CPAP

## 2021-02-09 LAB — BASIC METABOLIC PANEL
Anion gap: 8 (ref 5–15)
BUN: 26 mg/dL — ABNORMAL HIGH (ref 8–23)
CO2: 25 mmol/L (ref 22–32)
Calcium: 9 mg/dL (ref 8.9–10.3)
Chloride: 107 mmol/L (ref 98–111)
Creatinine, Ser: 1.33 mg/dL — ABNORMAL HIGH (ref 0.61–1.24)
GFR, Estimated: 50 mL/min — ABNORMAL LOW (ref >60)
Glucose, Bld: 92 mg/dL (ref 70–99)
Potassium: 4.2 mmol/L (ref 3.5–5.1)
Sodium: 140 mmol/L (ref 135–145)

## 2021-02-09 NOTE — Progress Notes (Signed)
Speech Language Pathology Weekly Progress and Session Note  Patient Details  Name: Douglas Walker MRN: [id number] Date of Birth: April 06, 1925  Beginning of progress report period: 02/02/2021 End of progress report period:  02/09/2021  Today's Date: 02/09/2021 SLP Individual Time: 1400-1445 SLP Individual Time Calculation (min): 45 min  Short Term Goals: Week 1: SLP Short Term Goal 1 (Week 1): Pt will consume therapeutic trials of thin H2O with minimal overt s/sx X2 prior to MBS. SLP Short Term Goal 1 - Progress (Week 1): Met SLP Short Term Goal 2 (Week 1): Pt will consume trials of dysphagia 2 (minced/ground) solids with efficienct mastication and oral clearance X2 prior to advancement. SLP Short Term Goal 2 - Progress (Week 1): Met SLP Short Term Goal 3 (Week 1): Pt will demonstrate ability to express his thoughts and needs with overall Min A cues for use of strategies for word finding and fluency. SLP Short Term Goal 3 - Progress (Week 1): Progressing toward goal SLP Short Term Goal 4 (Week 1): Pt will detect and correct verbal errors (including perseverations) with Min A cues. SLP Short Term Goal 4 - Progress (Week 1): Progressing toward goal SLP Short Term Goal 5 (Week 1): Pt will selectively attend to functional tasks for 5 minute intervals with Min A cues for redirection. SLP Short Term Goal 5 - Progress (Week 1): Met    New Short Term Goals: Week 2: SLP Short Term Goal 1 (Week 2): STG=LTG (ELOS of 10-14 days)  Weekly Progress Updates:  Patient made good progress and met 3/5 STG's and for the two he did not meet, is progressing steadily with. He had MBS which revealed silent aspiration with nectar thick and thin liquids but with aspiration prevented with chin tuck with nectar thick liquids only. Patient is demonstrating good awareness to his expressive aphasia as well as good safety awareness and problem solving with ADL's.   Intensity: Minumum of 1-2 x/day, 30 to 90  minutes Frequency: 3 to 5 out of 7 days Duration/Length of Stay: 10-14 days Treatment/Interventions: Functional tasks;Dysphagia/aspiration precaution training;Cueing hierarchy;Cognitive remediation/compensation;Speech/Language facilitation;Therapeutic Activities;Patient/family education;Internal/external aids   Daily Session  Skilled Therapeutic Interventions: Patient seen to address speech-language goals. Patient demonstrates awareness and understandable frustration with his expressive aphasia which presents most significantly during longer phrase and conversational level. "it comes to me and I want to say it but it wont come out the way I want to say it". He was struggling with trying to tell SLP about his work history and would exhibit phonemic paraphasias, perseverations on words and numbers, and word finding as well as semantic errors. Every time he made an error he would comment or otherwise indicate it wasn't right and would get frustrated but did not give up. He was able to make personal needs known, telling SLP that he wanted "a cold washcloth", that he thought his RW was too low and "call bell" when SLP was leaving room. Patient continues to demonstrate good progress overall and benefits from SLP intervention to maximize swallow function and speech-language and cognitive function prior to discharge.     General    Pain Pain Assessment Pain Scale: 0-10 Pain Score: 0-No pain  Therapy/Group: Individual Therapy  Sonia Baller, MA, CCC-SLP Speech Therapy

## 2021-02-09 NOTE — Progress Notes (Signed)
Speech Language Pathology Daily Session Note  Patient Details  Name: Douglas Walker MRN: [id number] Date of Birth: 1925/09/13  Today's Date: 02/09/2021 SLP Individual Time: 0805-0900 SLP Individual Time Calculation (min): 55 min  Short Term Goals: Week 1: SLP Short Term Goal 1 (Week 1): Pt will consume therapeutic trials of thin H2O with minimal overt s/sx X2 prior to MBS. SLP Short Term Goal 2 (Week 1): Pt will consume trials of dysphagia 2 (minced/ground) solids with efficienct mastication and oral clearance X2 prior to advancement. SLP Short Term Goal 3 (Week 1): Pt will demonstrate ability to express his thoughts and needs with overall Min A cues for use of strategies for word finding and fluency. SLP Short Term Goal 4 (Week 1): Pt will detect and correct verbal errors (including perseverations) with Min A cues. SLP Short Term Goal 5 (Week 1): Pt will selectively attend to functional tasks for 5 minute intervals with Min A cues for redirection.  Skilled Therapeutic Interventions:   Patient seen for skilled ST session focused on dysphagia and cognitive goals. Patient feeding himself breakfast but required cues and assistance for cutting up eggs into small bites. After SLP cued patient for chin tuck two times, he then demonstrated accurate and consistent chin tuck with sips of nectar thick liquids on 8/10 sips. Patient then stood up with RW and ambulated with CGA to sink. He washed his face and brushed teeth with supervision level A. No unsafe behaviors observed during session. Patient continues to benefit from skilled SLP intervention to maximize cognitive-linguistic and swallow function prior to discharge.  Pain Pain Assessment Pain Scale: 0-10 Pain Score: 0-No pain  Therapy/Group: Individual Therapy  Sonia Baller, MA, CCC-SLP 02/09/21 4:17 PM

## 2021-02-09 NOTE — Progress Notes (Signed)
Occupational Therapy Session Note  Patient Details  Name: Douglas Walker MRN: [id number] Date of Birth: September 12, 1925  Today's Date: 02/09/2021 OT Individual Time: 1250-1400 OT Individual Time Calculation (min): 70 min   Short Term Goals: Week 1:  OT Short Term Goal 1 (Week 1): Pt will complete toilet transfer with CGA OT Short Term Goal 2 (Week 1): Pt will correctly orient his shirt without cues and with 1st presentation OT Short Term Goal 3 (Week 1): Pt will complete LB dressing with Min A sit<stand    Skilled Therapeutic Interventions/Progress Updates:    [name] EOB, eating his lunch with NT present. OT provided supervision assistance for remainder of his meal, pt requiring cues for chin tuck post sips of beverage per swallowing instructions. His son, [name], arrived at this time. [name] was agreeable to participate in hands on family training during session. Pt has a walk-in shower with a seat, installed grab bars, and nonslip treads on the floor at home. OT demonstrated back-up shower transfer method and [name] stated that he can position the shower seat so that they can replicate this transfer technique at home. They already have a BSC and would like f/u OT to be home health. Discussed with [name] beforehand OT recommendation to have pt sit vs stand as much as possible for safety during functional tasks and to ensure that pt uses the RW during all aspects of functional transfers. [name] then provided supervision assist while pt ambulated using RW to the shower (after OT covered his IV). [name] provided supervision/cues while pt bathed on TTB at sit<stand level, dressing completed sit<stand using RW from TTB seat once pt dried himself off. [name] provided supervision assist while pt ambulated towards bed, combed his hair in standing with supervision from son (using RW). Pt then sat down and he was left EOB with SLP for next therapy session.  Therapy Documentation Precautions:   Precautions Precautions: Fall Precaution Comments: Aphasic, HOH, legally blind in L eye. Restrictions Weight Bearing Restrictions: No Pain: no s/s pain during tx   ADL: ADL Eating: Not assessed Grooming: Contact guard Where Assessed-Grooming: Standing at sink Upper Body Bathing: Contact guard Where Assessed-Upper Body Bathing: Standing at sink Lower Body Bathing: Contact guard Where Assessed-Lower Body Bathing: Sitting at sink,Standing at sink Upper Body Dressing: Contact guard Where Assessed-Upper Body Dressing: Standing at sink Lower Body Dressing: Moderate assistance Where Assessed-Lower Body Dressing: Standing at sink,Sitting at sink Toileting: Not assessed Tub/Shower Transfer: Not assessed      Therapy/Group: Individual Therapy  Robyne Matar A Sherra Kimmons 02/09/2021, 3:15 PM

## 2021-02-09 NOTE — Progress Notes (Signed)
Physical Therapy Weekly Progress Note  Patient Details  Name: Douglas Walker MRN: [id number] Date of Birth: [birth year]  Beginning of progress report period: February 02, 2021 End of progress report period: February 09, 2021  Today's Date: 02/09/2021 PT Individual Time: 5003-7048 PT Individual Time Calculation (min): 60 min   Patient has met 3 of 3 short term goals.  Pt has shown great progress with PT and currently supervision for bed mobility; CGA-SBA for transfers with Rolling walker and CGA for gait for 300'. Pt limited 2/2 word finding and need of extra time  Patient continues to demonstrate the following deficits muscle weakness, decreased cardiorespiratoy endurance and decreased standing balance and decreased balance strategies and therefore will continue to benefit from skilled PT intervention to increase functional independence with mobility.  Patient progressing toward long term goals..  Continue plan of care.  PT Short Term Goals Week 1:  PT Short Term Goal 1 (Week 1): pt to demonstrate supervision supine<>sit PT Short Term Goal 1 - Progress (Week 1): Met PT Short Term Goal 2 (Week 1): pt to demonstrate CGA sit<>stand with LRAD PT Short Term Goal 2 - Progress (Week 1): Met PT Short Term Goal 3 (Week 1): pt to demonstrate gait with LRAD for 200' at [facility] PT Short Term Goal 3 - Progress (Week 1): Met Week 2:  PT Short Term Goal 1 (Week 2): pt to demonstrate supervision for transfers with LRAD PT Short Term Goal 2 (Week 2): pt to demonstrate supervision for gait with LRAD 300'  Skilled Therapeutic Interventions/Progress Updates:    pt received in bed and agreeable to therapy. Pt directed in supine>sit supervision, Sit to stand CGA to Rolling walker gait training to toilet with Rolling walker CGA and (+) bladder void, CGA for clothing management and gait to sink, SBA for hand hygiene. Pt then directed in gait training with Rolling walker for 350' to day room, CGA with VC for trunk  extension, walker proximity. Pt directed in NMRE of  Reaching in various directions without UE support and VC for scanning environment to find object, pt is limited with vision and benefits from VC to assist in finding objects to reach for, CGA no LOB. Pt then directed in gait training to return to room, CGA no LOB and minimal VC, improved ability to maintain good posture. Pt then requested to use restroom, had BM and required max A for pericare. Pt then directed in gait to sink CGA and SBA for hand hygiene. Pt requested to return to bed, supervision to complete. Pt left in bed, All needs in reach and in good condition. Call light in hand.  Alarm set.    Therapy Documentation Precautions:  Precautions Precautions: Fall Precaution Comments: Aphasic, HOH, legally blind in L eye. Restrictions Weight Bearing Restrictions: No General:   Vital Signs:   Pain: Pain Assessment Pain Scale: 0-10 Pain Score: 8  Pain Type: Chronic pain Pain Location: Generalized Pain Frequency: Intermittent Pain Onset: On-going Pain Intervention(s): Medication (See eMAR) Vision/Perception     Mobility:   Locomotion :    Trunk/Postural Assessment :    Balance:   Exercises:   Other Treatments:     Therapy/Group: Individual Therapy  Douglas Walker 02/09/2021, 12:06 PM

## 2021-02-09 NOTE — Progress Notes (Signed)
[facility] PHYSICAL MEDICINE & REHABILITATION PROGRESS NOTE   Subjective/Complaints:  Pt reports needs to pee immediately- needs NT to get him into bathroom with RW.   Said hearing aids broken- couldn't explain how/why.   Upgraded to D2 nectar thick liquids.   HDQ:QIWLNLG due to [place]  Objective:   DG Swallowing Func-Speech Pathology  Result Date: 02/08/2021 Objective Swallowing Evaluation: Type of Study: MBS-Modified Barium Swallow Study  Patient Details Name: Douglas Walker MRN: [id number] Date of Birth: October 18, 1925 Today's Date: 02/08/2021 Time: SLP Start Time (ACUTE ONLY): 0814 -SLP Stop Time (ACUTE ONLY): 0942 SLP Time Calculation (min) (ACUTE ONLY): 17 min Past Medical History: Past Medical History: Diagnosis Date . Arthritis   shoulders and back . Cancer ([place])   skin cancers . Chronic low back pain 12/15/2017 . CKD (chronic kidney disease) stage 3, GFR 30-59 ml/min (HCC)  . Coronary artery disease  . Dysrhythmia    Paroxysmal atrial fibrillation; Tachycardia-bradycardia syndrome . Essential tremor 02/28/2016 . GERD (gastroesophageal reflux disease)  . Hernia  . History of hiatal hernia  . Hyperlipemia  . Hypertension  . Macular degeneration  . Neuromuscular disorder (HCC)   neuropathy . Paroxysmal atrial fibrillation ([place]) 10/28/2013 . Presence of permanent cardiac pacemaker 12/04/2018 . Tachycardia-bradycardia syndrome ([place]) 09/13/2014 . TIA (transient ischemic attack)  Past Surgical History: Past Surgical History: Procedure Laterality Date . APPENDECTOMY   . EYE SURGERY   . HERNIA REPAIR   . HERNIA REPAIR   . INSERT / REPLACE / REMOVE PACEMAKER  12/04/2018 . IRRIGATION AND DEBRIDEMENT ABSCESS Right 07/24/2020  Procedure: IRRIGATION AND DEBRIDEMENT HEMATOMA;  Surgeon: Coralie Keens, MD;  Location: [facility];  Service: General;  Laterality: Right; . MASS EXCISION Right 07/20/2020  Procedure: EXCISION OF RIGHT CHEST WALL MASS;  Surgeon: Stark Klein, MD;  Location: [facility];  Service:  General;  Laterality: Right; . PACEMAKER IMPLANT N/A 12/04/2018  Procedure: PACEMAKER IMPLANT;  Surgeon: Evans Lance, MD;  Location: [facility];  Service: Cardiovascular;  Laterality: N/A; HPI: Patient is a 87 y.o. mqale with PMH: paroxysmal afib, not on anticoagulants due to frequent falls, peripheral neuropathy, essential tremor, HTN, CKD stage IIIB, who presented to hospital from home (lives with son) with AMS. Son had reported sudden onset confusion, unable to talk , some drooling and wobbly when walking. CT head did not find anything significant, CTA showed occluded left M3/M4 but no intervention due to size of branch.  Subjective: upright in chair at bedside, aphasia noted, very HOH Assessment / Plan / Recommendation CHL IP CLINICAL IMPRESSIONS 02/08/2021 Clinical Impression Patient presents with a moderate sensory-motor based dysphagia resulting in silent aspiration of thin liquids and nectar thick liquids. Oral phase was mildly impaired with decreased mastication of regular solids and mild delay with oral transit of puree and regular solid boluses. He exhibited swallow initiation delay to level of vallecular sinus with puree solids,regular solids and honey thick liquids and delays to pyriform sinus with thin and nectar thick liquids. Aspiration events occured with thin liquids and nectar thick liquids during swallow secondary to very poor epiglottic deflection and airway closure.  He exhibited mild aspiration or thin liquids which reduced to trace with chin tuck posture. Aspiration with nectar thick liquids occured slightly less than 50% of the time, but when aspiration event occured, amount of bolus aspirated was moderate. Chin tuck posture prevented aspiration and penetration with cup sips of nectar thick liquids. Patient unable to clear any aspirate with cued cough. Trace residuals remained briefly  in posterior pharyngeal wall with initial swallow of regular solids, but trace to no residuals  remained in pharynx post swallows of puree solids, nectar thick liquids and thin liquids. SLP Visit Diagnosis -- Attention and concentration deficit following -- Frontal lobe and executive function deficit following -- Impact on safety and function --   CHL IP TREATMENT RECOMMENDATION 02/08/2021 Treatment Recommendations Therapy as outlined in treatment plan below   Prognosis 02/08/2021 Prognosis for Safe Diet Advancement Fair Barriers to Reach Goals -- Barriers/Prognosis Comment -- CHL IP DIET RECOMMENDATION 02/08/2021 SLP Diet Recommendations Dysphagia 2 (Fine chop) solids Liquid Administration via Cup;No straw Medication Administration Whole meds with puree Compensations Minimize environmental distractions;Slow rate;Small sips/bites;Chin tuck Postural Changes Seated upright at 90 degrees   CHL IP OTHER RECOMMENDATIONS 02/08/2021 Recommended Consults -- Oral Care Recommendations Oral care BID Other Recommendations --   CHL IP FOLLOW UP RECOMMENDATIONS 02/08/2021 Follow up Recommendations 24 hour supervision/assistance   CHL IP FREQUENCY AND DURATION 01/30/2021 Speech Therapy Frequency (ACUTE ONLY) min 2x/week Treatment Duration --      CHL IP ORAL PHASE 02/08/2021 Oral Phase Impaired Oral - Pudding Teaspoon -- Oral - Pudding Cup -- Oral - Honey Teaspoon -- Oral - Honey Cup -- Oral - Nectar Teaspoon WFL Oral - Nectar Cup Reduced posterior propulsion Oral - Nectar Straw -- Oral - Thin Teaspoon WFL Oral - Thin Cup Premature spillage Oral - Thin Straw -- Oral - Puree Reduced posterior propulsion Oral - Mech Soft -- Oral - Regular Reduced posterior propulsion;Impaired mastication Oral - Multi-Consistency -- Oral - Pill Reduced posterior propulsion Oral Phase - Comment --  CHL IP PHARYNGEAL PHASE 02/08/2021 Pharyngeal Phase Impaired Pharyngeal- Pudding Teaspoon -- Pharyngeal -- Pharyngeal- Pudding Cup -- Pharyngeal -- Pharyngeal- Honey Teaspoon Delayed swallow initiation-vallecula Pharyngeal Material does not enter airway  Pharyngeal- Honey Cup Delayed swallow initiation-vallecula;Pharyngeal residue - valleculae Pharyngeal Material does not enter airway Pharyngeal- Nectar Teaspoon Delayed swallow initiation-vallecula;Reduced airway/laryngeal closure;Penetration/Aspiration during swallow;Trace aspiration Pharyngeal Material enters airway, passes BELOW cords then ejected out Pharyngeal- Nectar Cup Delayed swallow initiation-pyriform sinuses;Reduced airway/laryngeal closure;Penetration/Aspiration during swallow;Moderate aspiration;Compensatory strategies attempted (with notebox) Pharyngeal Material enters airway, passes BELOW cords without attempt by patient to eject out (silent aspiration) Pharyngeal- Nectar Straw -- Pharyngeal -- Pharyngeal- Thin Teaspoon Delayed swallow initiation-pyriform sinuses;Trace aspiration Pharyngeal Material enters airway, passes BELOW cords without attempt by patient to eject out (silent aspiration) Pharyngeal- Thin Cup Delayed swallow initiation-pyriform sinuses Pharyngeal Material enters airway, passes BELOW cords without attempt by patient to eject out (silent aspiration) Pharyngeal- Thin Straw -- Pharyngeal -- Pharyngeal- Puree Delayed swallow initiation-vallecula Pharyngeal -- Pharyngeal- Mechanical Soft -- Pharyngeal -- Pharyngeal- Regular Delayed swallow initiation-vallecula;Pharyngeal residue - posterior pharnyx Pharyngeal -- Pharyngeal- Multi-consistency -- Pharyngeal -- Pharyngeal- Pill Delayed swallow initiation-vallecula Pharyngeal -- Pharyngeal Comment --  CHL IP CERVICAL ESOPHAGEAL PHASE 02/08/2021 Cervical Esophageal Phase WFL Pudding Teaspoon -- Pudding Cup -- Honey Teaspoon -- Honey Cup -- Nectar Teaspoon -- Nectar Cup -- Nectar Straw -- Thin Teaspoon -- Thin Cup -- Thin Straw -- Puree -- Mechanical Soft -- Regular -- Multi-consistency -- Pill -- Cervical Esophageal Comment -- Sonia Baller, MA, CCC-SLP Speech Therapy             No results for input(s): WBC, HGB, HCT, PLT in the last 72  hours. Recent Labs    02/07/21 0513 02/09/21 0507  NA 138 140  K 3.9 4.2  CL 104 107  CO2 23 25  GLUCOSE 92 92  BUN 27* 26*  CREATININE  1.29* 1.33*  CALCIUM 8.8* 9.0    Intake/Output Summary (Last 24 hours) at 02/09/2021 1110 Last data filed at 02/09/2021 0800 Gross per 24 hour  Intake 836 ml  Output --  Net 836 ml        Physical Exam: Vital Signs Blood pressure 122/61, pulse 70, temperature 98 F (36.7 C), resp. rate 20, height 5\' 9"  (1.753 m), weight 94.1 kg, SpO2 97 %. Constitutional: awake, but sleepy; stood with NT with RW to get to bathroom, NAD- aphasic HENT: Normocephalic.  Atraumatic. Hearing aids  "broken"- in their case Eyes: EOMI. No discharge. Cardiovascular: RRR- no JVD Respiratory: CTA B/L- no W/R/R- good air movement GI: Soft, NT, ND, (+)BS  Skin: Warm and dry. But has flat pruritic rash on back- some dried blood on shirt- scattered blood spots;  Looks a little better Scattered ecchymosis Psych: hurried to go pee-  Musc: No edema in extremities.  No tenderness in extremities. Neuro: HOH- alert Facial tremor- worse this AM- esp worse due to hurrying to bathroom Motor: LUE/LLE: 4+/5 RUE/RLE: Appears to be 4/5 proximal distal  Assessment/Plan: 1. Functional deficits which require 3+ hours per day of interdisciplinary therapy in a comprehensive inpatient rehab setting.  Physiatrist is providing close team supervision and 24 hour management of active medical problems listed below.  Physiatrist and rehab team continue to assess barriers to discharge/monitor patient progress toward functional and medical goals  Care Tool:  Bathing    Body parts bathed by patient: Right arm,Left arm,Chest,Abdomen,Front perineal area,Buttocks,Right upper leg,Left upper leg,Face,Right lower leg,Left lower leg   Body parts bathed by helper: Right lower leg,Left lower leg (due to tight space in shower, was able to wash feet sitting at the sink)     Bathing assist Assist  Level: Contact Guard/Touching assist     Upper Body Dressing/Undressing Upper body dressing   What is the patient wearing?: Pull over shirt    Upper body assist Assist Level: Contact Guard/Touching assist    Lower Body Dressing/Undressing Lower body dressing      What is the patient wearing?: Incontinence brief,Pants     Lower body assist Assist for lower body dressing: Minimal Assistance - Patient > 75% (with brief made into underwear)     Toileting Toileting Toileting Activity did not occur (Clothing management and hygiene only): N/A (no void or bm)  Toileting assist Assist for toileting: Contact Guard/Touching assist     Transfers Chair/bed transfer  Transfers assist     Chair/bed transfer assist level: Supervision/Verbal cueing     Locomotion Ambulation   Ambulation assist      Assist level: Contact Guard/Touching assist Assistive device: Walker-rolling Max distance: 150   Walk 10 feet activity   Assist     Assist level: Contact Guard/Touching assist Assistive device: Walker-rolling   Walk 50 feet activity   Assist    Assist level: Contact Guard/Touching assist Assistive device: Walker-rolling    Walk 150 feet activity   Assist    Assist level: Contact Guard/Touching assist Assistive device: Walker-rolling    Walk 10 feet on uneven surface  activity   Assist     Assist level: Minimal Assistance - Patient > 75% Assistive device: Aeronautical engineer Will patient use wheelchair at discharge?: No             Wheelchair 50 feet with 2 turns activity    Assist            Wheelchair  150 feet activity     Assist          Blood pressure 122/61, pulse 70, temperature 98 F (36.7 C), resp. rate 20, height 5\' 9"  (1.753 m), weight 94.1 kg, SpO2 97 %.    Medical Problem List and Plan: 1.  Right hemiparesis and aphasia secondary to left cortical infarct/embolic left MCA  Continue  CIR 2.  Antithrombotics: -DVT/anticoagulation: Eliquis             -antiplatelet therapy: N/A 3. Pain Management: Topamax 50 mg twice daily, Lyrica 25 mg nightly, Cymbalta 20 mg daily- c/o sides hurting- will add tramadol prn for severe pain  2/7- on pain regimen- doing well- con't regimen  2/8- wants tylenol- "hurts all over"- will let nursing know- con't regimen-  2/10- said hurting- wants tylenol this AM- con't regimen  4. Mood: Provide emotional support  2/10- will increase Duloxetine to 40 mg daily- - due to tearfulness- concerned about mood  2/11- denies side effects- con't regimen             -antipsychotic agents: N/A 5. Neuropsych: This patient is capable of making decisions on his own behalf. 6. Skin/Wound Care: Routine skin checks 7. Fluids/Electrolytes/Nutrition: Routine in and outs. 8.  PAF/pacemaker.  Cardiac rate controlled.  Continue Eliquis 9.  CKD stage IIIB    Creatinine 1.37 on 2/4, labs ordered for Monday  2/7- will order labs for Tuesday 2/8- since don't have today  2/8- Cr 1.01 and BUN 31- slightly dry- but on nectar thick liquids- will recheck Thursday and if rising more, will give IVFs.  2/9- actually started on IVFs 2/8- Cr up slightly- BUN down slightly to 27- will write for IVFs at night for now.   2/11- d/w nursing to push PO fluids more- esp since on nectar thick liquids - BUN stable at 26; but Cr slightly up at 1.33 from 1.29 (up from 1.21)- pt doesn't want IVFs- will recheck Saturday, and Monday to see if worsening- if gets worse, will NEED IVFs, regardless of pt desire.  10.  Diastolic congestive heart failure.  Patient on Lasix 60 mg daily prior to admission and has been on hold since admission.  Resume as needed monitor for any signs of fluid overload Filed Weights   02/08/21 0314 02/09/21 0332 02/09/21 0500  Weight: 93.9 kg 94.2 kg 94.1 kg   Daily weights ordered  2/10- weight stable - con't regimen 11.  Essential tremor.  Mysoline 250 mg twice daily-  mainly RUE and jaw  Persistent on 2/5  2/8- tremors slightly better this AM-c on't regimen  2/9- tremors worse this AM- con't regimen 12.  BPH.  Flomax 0.4 mg twice daily, Proscar 5 mg daily 13.  Legally blind.  Patient independent ADLs prior to admission 14.  Hypothyroidism: Synthroid 15.  History of ESBL 07/2020.    2/10- taken off contact precautions per Infection control  16. Apnea episodes  2/4- dropped sats to 84% during apnea episode yesterday- still having apnea- will try and get CPAP for pt- hopefully would wear.   2/8- pt refusing- says doesn't need- will try and get to wear  2/9- still refusing  2/10- will d/c CPAP- pt refuses to use, in spite of description of apnea episodes  2/11- d/c'd CPAP order 17.  Post stroke dysphagia  D1 nectars, advance diet as tolerated  2/7- is doing well with intake including nectar thick liquids- con't regimen  2/8- BUN 31- per staff drinking well, but need  to push more fluids PO  2/9- will order night time IVFs since not drinking nectar thick liquids well  2/10- pt refused night time IVFs- will recheck tomorrow and if worse, will then do night time IVFs  2/11- BUN stable - 26- was 33 when SO dry, but stable at 26- Cr 1.33- will recheck in AM since pt refusing IVFs at this time.  18. Constipation  2/7- LBM Friday 2/4- will try Miralax today and if not effective, will give Sorbitol.   2/8- LBM 2/6 small- will give Sorbitol today.   2/9- LBM last night after sorbitol  LOS: 8 days A FACE TO FACE EVALUATION WAS PERFORMED  Catheline Hixon 02/09/2021, 11:10 AM

## 2021-02-10 DIAGNOSIS — R7989 Other specified abnormal findings of blood chemistry: Secondary | ICD-10-CM

## 2021-02-10 LAB — BASIC METABOLIC PANEL
Anion gap: 8 (ref 5–15)
BUN: 24 mg/dL — ABNORMAL HIGH (ref 8–23)
CO2: 26 mmol/L (ref 22–32)
Calcium: 8.9 mg/dL (ref 8.9–10.3)
Chloride: 105 mmol/L (ref 98–111)
Creatinine, Ser: 1.27 mg/dL — ABNORMAL HIGH (ref 0.61–1.24)
GFR, Estimated: 53 mL/min — ABNORMAL LOW (ref >60)
Glucose, Bld: 101 mg/dL — ABNORMAL HIGH (ref 70–99)
Potassium: 4 mmol/L (ref 3.5–5.1)
Sodium: 139 mmol/L (ref 135–145)

## 2021-02-10 LAB — CBC WITH DIFFERENTIAL/PLATELET
Abs Immature Granulocytes: 0.02 10*3/uL (ref 0.00–0.07)
Basophils Absolute: 0 10*3/uL (ref 0.0–0.1)
Basophils Relative: 1 %
Eosinophils Absolute: 0.2 10*3/uL (ref 0.0–0.5)
Eosinophils Relative: 4 %
HCT: 34 % — ABNORMAL LOW (ref 39.0–52.0)
Hemoglobin: 10.8 g/dL — ABNORMAL LOW (ref 13.0–17.0)
Immature Granulocytes: 0 %
Lymphocytes Relative: 28 %
Lymphs Abs: 1.5 10*3/uL (ref 0.7–4.0)
MCH: 31.9 pg (ref 26.0–34.0)
MCHC: 31.8 g/dL (ref 30.0–36.0)
MCV: 100.3 fL — ABNORMAL HIGH (ref 80.0–100.0)
Monocytes Absolute: 0.6 10*3/uL (ref 0.1–1.0)
Monocytes Relative: 11 %
Neutro Abs: 3 10*3/uL (ref 1.7–7.7)
Neutrophils Relative %: 56 %
Platelets: 131 10*3/uL — ABNORMAL LOW (ref 150–400)
RBC: 3.39 MIL/uL — ABNORMAL LOW (ref 4.22–5.81)
RDW: 16 % — ABNORMAL HIGH (ref 11.5–15.5)
WBC: 5.3 10*3/uL (ref 4.0–10.5)
nRBC: 0 % (ref 0.0–0.2)

## 2021-02-10 MED ORDER — SORBITOL 70 % SOLN: 30.0000 mL | Freq: Every day | Status: DC | PRN

## 2021-02-10 MED ORDER — SENNOSIDES-DOCUSATE SODIUM 8.6-50 MG PO TABS
2.0000 | ORAL_TABLET | Freq: Every day | ORAL | Status: DC
  Administered 2021-02-10 – 2021-02-14 (×5): 2 via ORAL
  Filled 2021-02-10 (×5): qty 2

## 2021-02-10 NOTE — Progress Notes (Signed)
Physical Therapy Session Note  Patient Details  Name: Douglas Walker MRN: [id number] Date of Birth: May 10, 1925  Today's Date: 02/10/2021 PT Individual Time: 0905-0950 PT Individual Time Calculation (min): 45 min   Short Term Goals: Week 2:  PT Short Term Goal 1 (Week 2): pt to demonstrate supervision for transfers with LRAD PT Short Term Goal 2 (Week 2): pt to demonstrate supervision for gait with LRAD 300' Week 3:     Skilled Therapeutic Interventions/Progress Updates:     Patient supine in bed asleep upon PT arrival. Patient easily roused and agreeable to PT session. Patient denied pain during session.  Therapeutic Activity: Bed Mobility: Pt performs supine <> sit at supervision/ Mod I level.  Transfers: Throughout session, pt performs STS, SPVT and toilet transfers with supervision. Provided intermittent verbal cues for walker mgmt.  Gait Training:  Patient ambulated >350 feet x2 using RW with supervision. Activity tolerance is noted to be improving. Provided verbal cues for controlling speed, managing proximity to walker.   Neuromuscular Re-ed: NMR facilitated during session with focus on standing balance. Pt guided in static standing on pliant wedge for force of balance over heels, facilitation of ankle strategy, and achilles tendon/ gastroc/ soleus complex stretch. Progressed with addition of 3# weighted ball and self perturbations with arm movements. Pt also guided in forward lunges using RW x10 bilaterally and progressed to reciprocal forward lunges with HHA to reduce UE support. Good balance strategies demosntrated.  NMR performed for improvements in motor control and coordination, balance, sequencing, judgement, and self confidence/ efficacy in performing all aspects of mobility at highest level of independence.   Patient supine in bed at end of session with bed alarm set, and all needs within reach, and NT in room.   Therapy Documentation Precautions:   Precautions Precautions: Fall Precaution Comments: Aphasic, HOH, legally blind in L eye. Restrictions Weight Bearing Restrictions: No   Therapy/Group: Individual Therapy  Alger Simons 02/10/2021, 8:57 AM

## 2021-02-10 NOTE — Progress Notes (Signed)
[facility] PHYSICAL MEDICINE & REHABILITATION PROGRESS NOTE   Subjective/Complaints:  Pt sleeping when I entered. Indicated no problems this morning.   ROS: Limited due to cognitive/behavioral    Objective:   No results found. Recent Labs    02/10/21 0525  WBC 5.3  HGB 10.8*  HCT 34.0*  PLT 131*   Recent Labs    02/09/21 0507 02/10/21 0525  NA 140 139  K 4.2 4.0  CL 107 105  CO2 25 26  GLUCOSE 92 101*  BUN 26* 24*  CREATININE 1.33* 1.27*  CALCIUM 9.0 8.9    Intake/Output Summary (Last 24 hours) at 02/10/2021 0957 Last data filed at 02/10/2021 2423 Gross per 24 hour  Intake 718 ml  Output --  Net 718 ml        Physical Exam: Vital Signs Blood pressure 126/69, pulse 69, temperature 97.7 F (36.5 C), resp. rate 20, height 5\' 9"  (1.753 m), weight 94 kg, SpO2 98 %. Constitutional: No distress . Vital signs reviewed. HEENT: EOMI, oral membranes moist Neck: supple Cardiovascular: RRR without murmur. No JVD    Respiratory/Chest: CTA Bilaterally without wheezes or rales. Normal effort    GI/Abdomen: BS +, non-tender, non-distended Ext: no clubbing, cyanosis, or edema Psych: flat but cooperative Skin: Warm and dry.  Rash on back better Scattered ecchymosis  Musc: No edema in extremities.  No tenderness in extremities. Neuro: HOH- slow to arouse Motor: LUE/LLE: 4+/5 RUE/RLE: Appears to be 4/5 proximal distal  Assessment/Plan: 1. Functional deficits which require 3+ hours per day of interdisciplinary therapy in a comprehensive inpatient rehab setting.  Physiatrist is providing close team supervision and 24 hour management of active medical problems listed below.  Physiatrist and rehab team continue to assess barriers to discharge/monitor patient progress toward functional and medical goals  Care Tool:  Bathing    Body parts bathed by patient: Right arm,Left arm,Chest,Abdomen,Front perineal area,Buttocks,Right upper leg,Left upper leg,Face,Right lower  leg,Left lower leg   Body parts bathed by helper: Right lower leg,Left lower leg (due to tight space in shower, was able to wash feet sitting at the sink)     Bathing assist Assist Level: Supervision/Verbal cueing     Upper Body Dressing/Undressing Upper body dressing   What is the patient wearing?: Pull over shirt    Upper body assist Assist Level: Set up assist    Lower Body Dressing/Undressing Lower body dressing      What is the patient wearing?: Underwear/pull up,Pants     Lower body assist Assist for lower body dressing: Supervision/Verbal cueing     Toileting Toileting Toileting Activity did not occur (Clothing management and hygiene only): N/A (no void or bm)  Toileting assist Assist for toileting: Contact Guard/Touching assist     Transfers Chair/bed transfer  Transfers assist     Chair/bed transfer assist level: Supervision/Verbal cueing     Locomotion Ambulation   Ambulation assist      Assist level: Contact Guard/Touching assist Assistive device: Walker-rolling Max distance: 150   Walk 10 feet activity   Assist     Assist level: Contact Guard/Touching assist Assistive device: Walker-rolling   Walk 50 feet activity   Assist    Assist level: Contact Guard/Touching assist Assistive device: Walker-rolling    Walk 150 feet activity   Assist    Assist level: Contact Guard/Touching assist Assistive device: Walker-rolling    Walk 10 feet on uneven surface  activity   Assist     Assist level: Minimal Assistance -  Patient > 75% Assistive device: Aeronautical engineer Will patient use wheelchair at discharge?: No             Wheelchair 50 feet with 2 turns activity    Assist            Wheelchair 150 feet activity     Assist          Blood pressure 126/69, pulse 69, temperature 97.7 F (36.5 C), resp. rate 20, height 5\' 9"  (1.753 m), weight 94 kg, SpO2 98 %.    Medical  Problem List and Plan: 1.  Right hemiparesis and aphasia secondary to left cortical infarct/embolic left MCA  Continue CIR 2.  Antithrombotics: -DVT/anticoagulation: Eliquis             -antiplatelet therapy: N/A 3. Pain Management: Topamax 50 mg twice daily, Lyrica 25 mg nightly, Cymbalta 20 mg daily- c/o sides hurting- will add tramadol prn for severe pain  2/7- on pain regimen- doing well- con't regimen  2/8- wants tylenol- "hurts all over"- will let nursing know- con't regimen-  2/10- said hurting- wants tylenol this AM- con't regimen  4. Mood: Provide emotional support  2/10- will increase Duloxetine to 40 mg daily- - due to tearfulness- concerned about mood  2/11- denies side effects- con't regimen             -antipsychotic agents: N/A 5. Neuropsych: This patient is capable of making decisions on his own behalf. 6. Skin/Wound Care: Routine skin checks 7. Fluids/Electrolytes/Nutrition: Routine in and outs. 8.  PAF/pacemaker.  Cardiac rate controlled.  Continue Eliquis 9.  CKD stage IIIB    Creatinine 1.37 on 2/4, labs ordered for Monday  2/7- will order labs for Tuesday 2/8- since don't have today  2/8- Cr 1.01 and BUN 31- slightly dry- but on nectar thick liquids- will recheck Thursday and if rising more, will give IVFs.  2/9- actually started on IVFs 2/8- Cr up slightly- BUN down slightly to 27- will write for IVFs at night for now.   2/11- d/w nursing to push PO fluids more- esp since on nectar thick liquids - BUN stable at 26; but Cr slightly up at 1.33 from 1.29 (up from 1.21)- pt doesn't want IVFs- will recheck Saturday, and Monday to see if worsening- if gets worse, will NEED IVFs, regardless of pt desire.   2/12 BUN/Cr stable to improved 24/1.27   -continue to encourage fluids   -recheck Monday  10.  Diastolic congestive heart failure.  Patient on Lasix 60 mg daily prior to admission and has been on hold since admission.  Resume as needed monitor for any signs of fluid  overload Filed Weights   02/09/21 0500 02/10/21 0422 02/10/21 0500  Weight: 94.1 kg 94 kg 94 kg   Daily weights ordered  2/10-12- weight stable - con't regimen 11.  Essential tremor.  Mysoline 250 mg twice daily- mainly RUE and jaw  Persistent on 2/5  2/8- tremors slightly better this AM-c on't regimen  2/12- tremors stable. Continue above 12.  BPH.  Flomax 0.4 mg twice daily, Proscar 5 mg daily 13.  Legally blind.  Patient independent ADLs prior to admission 14.  Hypothyroidism: Synthroid 15.  History of ESBL 07/2020.    2/10- taken off contact precautions per Infection control  16. Apnea episodes  2/4- dropped sats to 84% during apnea episode yesterday- still having apnea- will try and get CPAP for pt- hopefully would wear.  2/8- pt refusing- says doesn't need- will try and get to wear  2/9- still refusing  2/10- will d/c CPAP- pt refuses to use, in spite of description of apnea episodes  2/11- d/c'd CPAP order 17.  Post stroke dysphagia  D1 nectars, advance diet as tolerated  2/7- is doing well with intake including nectar thick liquids- con't regimen  2/8- BUN 31- per staff drinking well, but need to push more fluids PO  2/9- will order night time IVFs since not drinking nectar thick liquids well  2/12 see #9 18. Constipation  2/7- LBM Friday 2/4- will try Miralax today and if not effective, will give Sorbitol.   2/8- LBM 2/6 small- will give Sorbitol today.   2/9- LBM last night after sorbitol  2/12 needs another bmt this weekend   -repeat sorbitol   -schedule senna-s  LOS: 9 days A FACE TO FACE EVALUATION WAS PERFORMED  Meredith Staggers 02/10/2021, 9:57 AM

## 2021-02-11 NOTE — Progress Notes (Signed)
Occupational Therapy Weekly Progress Note  Patient Details  Name: Douglas Walker MRN: [id number] Date of Birth: Jan 17, 1925  Beginning of progress report period: 02/02/2021 End of progress report period: 02/11/2021  Today's Date: 02/11/2021 OT Individual Time: 1045-1200 OT Individual Time Calculation (min): 75 min   Patient has met 3 of 3 short term goals.    Pt has made measurable progress at time of report. He completes functional tasks and transfers at CGA-supervision level using the RW. Hands on family education has been completed with his son, [name]. Pt to d/c this week at a solid supervision level to d/c home with family support and 24/7 supervision.    Patient continues to demonstrate the following deficits: muscle weakness, decreased cardiorespiratoy endurance, decreased coordination, decreased visual acuity and Lt eye blindness, decreased problem solving and decreased safety awareness and decreased standing balance, hemiplegia and decreased balance strategies and therefore will continue to benefit from skilled OT intervention to enhance overall performance with BADL.  Patient progressing toward long term goals..  Continue plan of care.  OT Short Term Goals Week 1:  OT Short Term Goal 1 (Week 1): Pt will complete toilet transfer with CGA OT Short Term Goal 1 - Progress (Week 1): Met OT Short Term Goal 2 (Week 1): Pt will correctly orient his shirt without cues and with 1st presentation OT Short Term Goal 2 - Progress (Week 1): Met OT Short Term Goal 3 (Week 1): Pt will complete LB dressing with Min A sit<stand OT Short Term Goal 3 - Progress (Week 1): Met Week 2:  OT Short Term Goal 1 (Week 2): STGs=LTGs due to ELOS  Skilled Therapeutic Interventions/Progress Updates:    [name] in bed, agreeable to shower and initiating removal of his hearing aide. Supervision for ambulation into bathroom using RW where pt was provided with multimodal cues to doff clothes in sitting vs  standing. Per son from previous session, pt tends to prefer doing things his own way and will ignore cuing. He had 1 LOB onto the TTB while backing up to it with his pants around his ankles, CGA for safe descent to transfer surface. Pt then bathed while standing ~90% of the time with grab bar support, supervision for balance with pt adamant about using the hand held shower with his tremulous Rt UE. Increased time required for drying the shower area due to water spray. He dressed sit<stand from toilet after, significantly increased time required for donning pants and gripper socks, cues for orienting his pull ups and overall problem solving. Increased time for pt to express that he wanted a white vs blue shirt however he had no more white shirts in his clothing bag. Pt returned to bed using the RW and was left with all needs within reach and bed alarm set. Tx focus placed on functional transfers, ADL retraining, dynamic balance, and activity tolerance.     Therapy Documentation Precautions:  Precautions Precautions: Fall Precaution Comments: Aphasic, HOH, legally blind in L eye. Restrictions Weight Bearing Restrictions: No Vital Signs: Therapy Vitals Temp: 98.2 F (36.8 C) Temp Source: Oral Pulse Rate: 70 Resp: 18 BP: 130/71 Patient Position (if appropriate): Lying Oxygen Therapy SpO2: 95 % O2 Device: Room Air Pain: in knees bilaterally near end of session, notified RN of pts request for pain medicine    ADL: ADL Eating: Not assessed Grooming: Contact guard Where Assessed-Grooming: Standing at sink Upper Body Bathing: Contact guard Where Assessed-Upper Body Bathing: Standing at sink Lower Body Bathing: Contact guard  Where Assessed-Lower Body Bathing: Sitting at sink,Standing at sink Upper Body Dressing: Contact guard Where Assessed-Upper Body Dressing: Standing at sink Lower Body Dressing: Moderate assistance Where Assessed-Lower Body Dressing: Standing at sink,Sitting at  sink Toileting: Not assessed Tub/Shower Transfer: Not assessed      Therapy/Group: Individual Therapy  Aneesh Faller A Seryna Marek 02/11/2021, 7:19 AM

## 2021-02-11 NOTE — Progress Notes (Signed)
[facility] PHYSICAL MEDICINE & REHABILITATION PROGRESS NOTE   Subjective/Complaints:  No new issues. Mild headache at times. Slept per nursing  ROS: Limited due to cognitive/behavioral    Objective:   No results found. Recent Labs    02/10/21 0525  WBC 5.3  HGB 10.8*  HCT 34.0*  PLT 131*   Recent Labs    02/09/21 0507 02/10/21 0525  NA 140 139  K 4.2 4.0  CL 107 105  CO2 25 26  GLUCOSE 92 101*  BUN 26* 24*  CREATININE 1.33* 1.27*  CALCIUM 9.0 8.9    Intake/Output Summary (Last 24 hours) at 02/11/2021 0834 Last data filed at 02/10/2021 2138 Gross per 24 hour  Intake 560 ml  Output --  Net 560 ml        Physical Exam: Vital Signs Blood pressure 130/71, pulse 70, temperature 98.2 F (36.8 C), temperature source Oral, resp. rate 18, height 5\' 9"  (1.753 m), weight 96.3 kg, SpO2 95 %. Constitutional: No distress . Vital signs reviewed. HEENT: EOMI, oral membranes moist Neck: supple Cardiovascular: RRR without murmur. No JVD    Respiratory/Chest: CTA Bilaterally without wheezes or rales. Normal effort    GI/Abdomen: BS +, non-tender, non-distended Ext: no clubbing, cyanosis, or edema Psych: flat, cooperates Skin: Warm and dry.  Rash on back better Scattered ecchymosis  Musc: No edema in extremities.  No tenderness in extremities. Neuro: HOH- delayed processing Motor: LUE/LLE: 4+/5 RUE/RLE: Appears to be 4/5 proximal distal  Assessment/Plan: 1. Functional deficits which require 3+ hours per day of interdisciplinary therapy in a comprehensive inpatient rehab setting.  Physiatrist is providing close team supervision and 24 hour management of active medical problems listed below.  Physiatrist and rehab team continue to assess barriers to discharge/monitor patient progress toward functional and medical goals  Care Tool:  Bathing    Body parts bathed by patient: Right arm,Left arm,Chest,Abdomen,Front perineal area,Buttocks,Right upper leg,Left upper  leg,Face,Right lower leg,Left lower leg   Body parts bathed by helper: Right lower leg,Left lower leg (due to tight space in shower, was able to wash feet sitting at the sink)     Bathing assist Assist Level: Supervision/Verbal cueing     Upper Body Dressing/Undressing Upper body dressing   What is the patient wearing?: Pull over shirt    Upper body assist Assist Level: Set up assist    Lower Body Dressing/Undressing Lower body dressing      What is the patient wearing?: Underwear/pull up,Pants     Lower body assist Assist for lower body dressing: Supervision/Verbal cueing     Toileting Toileting Toileting Activity did not occur (Clothing management and hygiene only): N/A (no void or bm)  Toileting assist Assist for toileting: Contact Guard/Touching assist     Transfers Chair/bed transfer  Transfers assist     Chair/bed transfer assist level: Supervision/Verbal cueing     Locomotion Ambulation   Ambulation assist      Assist level: Contact Guard/Touching assist Assistive device: Walker-rolling Max distance: 150   Walk 10 feet activity   Assist     Assist level: Contact Guard/Touching assist Assistive device: Walker-rolling   Walk 50 feet activity   Assist    Assist level: Contact Guard/Touching assist Assistive device: Walker-rolling    Walk 150 feet activity   Assist    Assist level: Contact Guard/Touching assist Assistive device: Walker-rolling    Walk 10 feet on uneven surface  activity   Assist     Assist level: Minimal Assistance -  Patient > 75% Assistive device: Aeronautical engineer Will patient use wheelchair at discharge?: No             Wheelchair 50 feet with 2 turns activity    Assist            Wheelchair 150 feet activity     Assist          Blood pressure 130/71, pulse 70, temperature 98.2 F (36.8 C), temperature source Oral, resp. rate 18, height 5\' 9"  (1.753  m), weight 96.3 kg, SpO2 95 %.    Medical Problem List and Plan: 1.  Right hemiparesis and aphasia secondary to left cortical infarct/embolic left MCA  Continue CIR 2.  Antithrombotics: -DVT/anticoagulation: Eliquis             -antiplatelet therapy: N/A 3. Pain Management: Topamax 50 mg twice daily, Lyrica 25 mg nightly, Cymbalta 20 mg daily- c/o sides hurting- will add tramadol prn for severe pain  2/7- on pain regimen- doing well- con't regimen  2/8- wants tylenol- "hurts all over"- will let nursing know- con't regimen-  2/10- said hurting- wants tylenol this AM- con't regimen  4. Mood: Provide emotional support  2/10- will increase Duloxetine to 40 mg daily- - due to tearfulness- concerned about mood  2/11- denies side effects- con't regimen             -antipsychotic agents: N/A 5. Neuropsych: This patient is capable of making decisions on his own behalf. 6. Skin/Wound Care: Routine skin checks 7. Fluids/Electrolytes/Nutrition: Routine in and outs. 8.  PAF/pacemaker.  Cardiac rate controlled.  Continue Eliquis 9.  CKD stage IIIB    Creatinine 1.37 on 2/4, labs ordered for Monday  2/7- will order labs for Tuesday 2/8- since don't have today  2/8- Cr 1.01 and BUN 31- slightly dry- but on nectar thick liquids- will recheck Thursday and if rising more, will give IVFs.  2/9- actually started on IVFs 2/8- Cr up slightly- BUN down slightly to 27- will write for IVFs at night for now.   2/11- d/w nursing to push PO fluids more- esp since on nectar thick liquids - BUN stable at 26; but Cr slightly up at 1.33 from 1.29 (up from 1.21)- pt doesn't want IVFs- will recheck Saturday, and Monday to see if worsening- if gets worse, will NEED IVFs, regardless of pt desire.   2/12 BUN/Cr stable to improved 24/1.27   -continue to encourage fluids  2/13 eating well. +680cc yesterday.  recheck BMET Monday  10.  Diastolic congestive heart failure.  Patient on Lasix 60 mg daily prior to admission and has  been on hold since admission.  Resume as needed monitor for any signs of fluid overload Filed Weights   02/10/21 0422 02/10/21 0500 02/11/21 0422  Weight: 94 kg 94 kg 96.3 kg   Daily weights ordered  2/10-12- weight stable - con't regimen 11.  Essential tremor.  Mysoline 250 mg twice daily- mainly RUE and jaw  Persistent on 2/5  2/8- tremors slightly better this AM-c on't regimen  2/12-13: tremors stable. Continue above 12.  BPH.  Flomax 0.4 mg twice daily, Proscar 5 mg daily 13.  Legally blind.  Patient independent ADLs prior to admission 14.  Hypothyroidism: Synthroid 15.  History of ESBL 07/2020.    2/10- taken off contact precautions per Infection control  16. Apnea episodes  2/4- dropped sats to 84% during apnea episode yesterday- still having apnea- will try and  get CPAP for pt- hopefully would wear.   2/8- pt refusing- says doesn't need- will try and get to wear  2/9- still refusing  2/10- will d/c CPAP- pt refuses to use, in spite of description of apnea episodes  2/11- d/c'd CPAP order 17.  Post stroke dysphagia  D1 nectars, advance diet as tolerated  2/7- is doing well with intake including nectar thick liquids- con't regimen  2/8- BUN 31- per staff drinking well, but need to push more fluids PO  2/9- will order night time IVFs since not drinking nectar thick liquids well  2/13 see #9 18. Constipation  2/7- LBM Friday 2/4- will try Miralax today and if not effective, will give Sorbitol.   2/8- LBM 2/6 small- will give Sorbitol today.   2/9- LBM last night after sorbitol  2/13 sorbitol yesterday   -bm yesterday   -continue schedule senna-s  LOS: 10 days A FACE TO FACE EVALUATION WAS PERFORMED  Meredith Staggers 02/11/2021, 8:34 AM

## 2021-02-11 NOTE — Progress Notes (Signed)
Occupational Therapy Session Note  Patient Details  Name: Douglas Walker MRN: [id number] Date of Birth: 1925/10/05  Today's Date: 02/11/2021 OT Individual Time: 1400-1445 OT Individual Time Calculation (min): 45 min    Short Term Goals: Week 1:  OT Short Term Goal 1 (Week 1): Pt will complete toilet transfer with CGA OT Short Term Goal 2 (Week 1): Pt will correctly orient his shirt without cues and with 1st presentation OT Short Term Goal 3 (Week 1): Pt will complete LB dressing with Min A sit<stand  Skilled Therapeutic Interventions/Progress Updates:    Patient in bed, alert, HOH, pleasant and cooperative.  Supine to sitting edge of bed with CS.  He c/o back being itchy - assisted with applying hydrocortisone cream as per nursing instructions.  He changed his shirt with set up/CS.  Sit to stand and ambulation on unit with RW to/from therapy gym with CS/CGA.  He completed light UB and trunk mobility activities without difficulty.  After return to room completed toileting with min A for hygiene and pants up.  Ambulates with RW to sink - hand hygiene CS.  Returned to bed at close of session, sit to supine CS.  Bed alarm set and call bell in reach.    Therapy Documentation Precautions:  Precautions Precautions: Fall Precaution Comments: Aphasic, HOH, legally blind in L eye. Restrictions Weight Bearing Restrictions: No  Therapy/Group: Individual Therapy  Carlos Levering 02/11/2021, 7:32 AM

## 2021-02-12 LAB — COMPREHENSIVE METABOLIC PANEL
ALT: 32 U/L (ref 0–44)
AST: 23 U/L (ref 15–41)
Albumin: 3.2 g/dL — ABNORMAL LOW (ref 3.5–5.0)
Alkaline Phosphatase: 117 U/L (ref 38–126)
Anion gap: 8 (ref 5–15)
BUN: 25 mg/dL — ABNORMAL HIGH (ref 8–23)
CO2: 26 mmol/L (ref 22–32)
Calcium: 8.9 mg/dL (ref 8.9–10.3)
Chloride: 105 mmol/L (ref 98–111)
Creatinine, Ser: 1.24 mg/dL (ref 0.61–1.24)
GFR, Estimated: 54 mL/min — ABNORMAL LOW (ref >60)
Glucose, Bld: 96 mg/dL (ref 70–99)
Potassium: 4 mmol/L (ref 3.5–5.1)
Sodium: 139 mmol/L (ref 135–145)
Total Bilirubin: 0.8 mg/dL (ref 0.3–1.2)
Total Protein: 6.3 g/dL — ABNORMAL LOW (ref 6.5–8.1)

## 2021-02-12 LAB — CBC WITH DIFFERENTIAL/PLATELET
Abs Immature Granulocytes: 0.03 10*3/uL (ref 0.00–0.07)
Basophils Absolute: 0 10*3/uL (ref 0.0–0.1)
Basophils Relative: 1 %
Eosinophils Absolute: 0.2 10*3/uL (ref 0.0–0.5)
Eosinophils Relative: 4 %
HCT: 34.7 % — ABNORMAL LOW (ref 39.0–52.0)
Hemoglobin: 11.1 g/dL — ABNORMAL LOW (ref 13.0–17.0)
Immature Granulocytes: 1 %
Lymphocytes Relative: 30 %
Lymphs Abs: 1.6 10*3/uL (ref 0.7–4.0)
MCH: 32.1 pg (ref 26.0–34.0)
MCHC: 32 g/dL (ref 30.0–36.0)
MCV: 100.3 fL — ABNORMAL HIGH (ref 80.0–100.0)
Monocytes Absolute: 0.6 10*3/uL (ref 0.1–1.0)
Monocytes Relative: 11 %
Neutro Abs: 2.8 10*3/uL (ref 1.7–7.7)
Neutrophils Relative %: 53 %
Platelets: 131 10*3/uL — ABNORMAL LOW (ref 150–400)
RBC: 3.46 MIL/uL — ABNORMAL LOW (ref 4.22–5.81)
RDW: 15.9 % — ABNORMAL HIGH (ref 11.5–15.5)
WBC: 5.2 10*3/uL (ref 4.0–10.5)
nRBC: 0 % (ref 0.0–0.2)

## 2021-02-12 MED ORDER — FLUTICASONE PROPIONATE 50 MCG/ACT NA SUSP
2.0000 | Freq: Every day | NASAL | Status: DC
  Administered 2021-02-12 – 2021-02-15 (×4): 2 via NASAL
  Filled 2021-02-12: qty 16

## 2021-02-12 NOTE — Progress Notes (Signed)
[place] PHYSICAL MEDICINE & REHABILITATION PROGRESS NOTE   Subjective/Complaints:  Pt reports his nose is itchy and dripping-  Inside the nose- he notes that is trying to drink everything people are bringing him- but  He doesn't think it's enough.   Talked to NT to push and get him MORE fluids.      ROS: limited due to cognitive/aphasia   Objective:   No results found. Recent Labs    02/10/21 0525 02/12/21 0557  WBC 5.3 5.2  HGB 10.8* 11.1*  HCT 34.0* 34.7*  PLT 131* 131*   Recent Labs    02/10/21 0525 02/12/21 0557  NA 139 139  K 4.0 4.0  CL 105 105  CO2 26 26  GLUCOSE 101* 96  BUN 24* 25*  CREATININE 1.27* 1.24  CALCIUM 8.9 8.9    Intake/Output Summary (Last 24 hours) at 02/12/2021 0914 Last data filed at 02/12/2021 0855 Gross per 24 hour  Intake 1040 ml  Output --  Net 1040 ml        Physical Exam: Vital Signs Blood pressure 137/74, pulse 70, temperature 98.5 F (36.9 C), temperature source Oral, resp. rate 20, height 5\' 9"  (1.753 m), weight 96.3 kg, SpO2 94 %. Constitutional: No distress . Vital signs reviewed. Awake, sitting EOB eating fast again, NT at bedside- had 8oz fluid on tray- per NT, added another 4 oz.  HEENT: EOMI, oral membranes a little dry- but better; very congested- and nares appear a little erythematous.  Neck: supple Cardiovascular: RRR- no JVD  Respiratory/Chest: .CTA B/L- no W/R/R- good air movement  GI/Abdomen: Soft, NT, ND, (+)BS  Ext: no clubbing, cyanosis, or edema Psych: frustrated due to aphasia, not finding words Skin: Warm and dry.  Rash on back better Scattered ecchymosis  Musc: No edema in extremities.  No tenderness in extremities. Neuro: HOH- slow to arouse Motor: LUE/LLE: 4+/5 RUE/RLE: Appears to be 4/5 proximal distal  Assessment/Plan: 1. Functional deficits which require 3+ hours per day of interdisciplinary therapy in a comprehensive inpatient rehab setting.  Physiatrist is providing close team  supervision and 24 hour management of active medical problems listed below.  Physiatrist and rehab team continue to assess barriers to discharge/monitor patient progress toward functional and medical goals  Care Tool:  Bathing    Body parts bathed by patient: Right arm,Left arm,Chest,Abdomen,Front perineal area,Buttocks,Right upper leg,Left upper leg,Face,Right lower leg,Left lower leg   Body parts bathed by helper: Right lower leg,Left lower leg (due to tight space in shower, was able to wash feet sitting at the sink)     Bathing assist Assist Level: Supervision/Verbal cueing     Upper Body Dressing/Undressing Upper body dressing   What is the patient wearing?: Pull over shirt    Upper body assist Assist Level: Supervision/Verbal cueing    Lower Body Dressing/Undressing Lower body dressing      What is the patient wearing?: Underwear/pull up,Pants     Lower body assist Assist for lower body dressing: Supervision/Verbal cueing     Toileting Toileting Toileting Activity did not occur (Clothing management and hygiene only): N/A (no void or bm)  Toileting assist Assist for toileting: Contact Guard/Touching assist     Transfers Chair/bed transfer  Transfers assist     Chair/bed transfer assist level: Supervision/Verbal cueing     Locomotion Ambulation   Ambulation assist      Assist level: Contact Guard/Touching assist Assistive device: Walker-rolling Max distance: 150   Walk 10 feet activity   Assist  Assist level: Contact Guard/Touching assist Assistive device: Walker-rolling   Walk 50 feet activity   Assist    Assist level: Contact Guard/Touching assist Assistive device: Walker-rolling    Walk 150 feet activity   Assist    Assist level: Contact Guard/Touching assist Assistive device: Walker-rolling    Walk 10 feet on uneven surface  activity   Assist     Assist level: Minimal Assistance - Patient > 75% Assistive device:  Aeronautical engineer Will patient use wheelchair at discharge?: No             Wheelchair 50 feet with 2 turns activity    Assist            Wheelchair 150 feet activity     Assist          Blood pressure 137/74, pulse 70, temperature 98.5 F (36.9 C), temperature source Oral, resp. rate 20, height 5\' 9"  (1.753 m), weight 96.3 kg, SpO2 94 %.    Medical Problem List and Plan: 1.  Right hemiparesis and aphasia secondary to left cortical infarct/embolic left MCA  Continue CIR 2.  Antithrombotics: -DVT/anticoagulation: Eliquis             -antiplatelet therapy: N/A 3. Pain Management: Topamax 50 mg twice daily, Lyrica 25 mg nightly, Cymbalta 20 mg daily- c/o sides hurting- will add tramadol prn for severe pain  2/7- on pain regimen- doing well- con't regimen  2/8- wants tylenol- "hurts all over"- will let nursing know- con't regimen-  2/10- said hurting- wants tylenol this AM- con't regimen  2/14- pain controlled when needs tylenol- con't regimen  4. Mood: Provide emotional support  2/10- will increase Duloxetine to 40 mg daily- - due to tearfulness- concerned about mood  2/11- denies side effects- con't regimen             -antipsychotic agents: N/A 5. Neuropsych: This patient is capable of making decisions on his own behalf. 6. Skin/Wound Care: Routine skin checks 7. Fluids/Electrolytes/Nutrition: Routine in and outs. 8.  PAF/pacemaker.  Cardiac rate controlled.  Continue Eliquis 9.  CKD stage IIIB    Creatinine 1.37 on 2/4, labs ordered for Monday  2/7- will order labs for Tuesday 2/8- since don't have today  2/8- Cr 1.01 and BUN 31- slightly dry- but on nectar thick liquids- will recheck Thursday and if rising more, will give IVFs.  2/9- actually started on IVFs 2/8- Cr up slightly- BUN down slightly to 27- will write for IVFs at night for now.   2/11- d/w nursing to push PO fluids more- esp since on nectar thick liquids - BUN stable  at 26; but Cr slightly up at 1.33 from 1.29 (up from 1.21)- pt doesn't want IVFs- will recheck Saturday, and Monday to see if worsening- if gets worse, will NEED IVFs, regardless of pt desire.   2/12 BUN/Cr stable to improved 24/1.27   -continue to encourage fluids   -recheck Monday   2/14- Cr down to 1.24 and BUN stable at 25- push nectar thick liquids- d/w NT 10.  Diastolic congestive heart failure.  Patient on Lasix 60 mg daily prior to admission and has been on hold since admission.  Resume as needed monitor for any signs of fluid overload Filed Weights   02/10/21 0500 02/11/21 0422 02/12/21 0500  Weight: 94 kg 96.3 kg 96.3 kg   Daily weights ordered  2/10-12- weight stable - con't regimen 11.  Essential tremor.  Mysoline  250 mg twice daily- mainly RUE and jaw  Persistent on 2/5  2/8- tremors slightly better this AM-c on't regimen  2/12- tremors stable. Continue above 12.  BPH.  Flomax 0.4 mg twice daily, Proscar 5 mg daily 13.  Legally blind.  Patient independent ADLs prior to admission 14.  Hypothyroidism: Synthroid 15.  History of ESBL 07/2020.    2/10- taken off contact precautions per Infection control  16. Apnea episodes  2/4- dropped sats to 84% during apnea episode yesterday- still having apnea- will try and get CPAP for pt- hopefully would wear.   2/8- pt refusing- says doesn't need- will try and get to wear  2/9- still refusing  2/10- will d/c CPAP- pt refuses to use, in spite of description of apnea episodes  2/11- d/c'd CPAP order 17.  Post stroke dysphagia  D1 nectars, advance diet as tolerated  2/7- is doing well with intake including nectar thick liquids- con't regimen  2/8- BUN 31- per staff drinking well, but need to push more fluids PO  2/9- will order night time IVFs since not drinking nectar thick liquids well  2/12 see #9  2/14- refusing night time IVFs- con't regimen 18. Constipation  2/7- LBM Friday 2/4- will try Miralax today and if not effective, will  give Sorbitol.   2/8- LBM 2/6 small- will give Sorbitol today.   2/9- LBM last night after sorbitol  2/12 needs another bmt this weekend   -repeat sorbitol   -schedule senna-s 19. Nasal congestion/itching  2/14- added Flonase 2 sprays each nare daily  LOS: 11 days A FACE TO FACE EVALUATION WAS PERFORMED  Karessa Onorato 02/12/2021, 9:14 AM

## 2021-02-12 NOTE — Progress Notes (Signed)
Speech Language Pathology Daily Session Note  Patient Details  Name: Douglas Walker MRN: [id number] Date of Birth: 1925-08-14  Today's Date: 02/12/2021 SLP Individual Time: 1402-1500 SLP Individual Time Calculation (min): 58 min  Short Term Goals: Week 2: SLP Short Term Goal 1 (Week 2): STG=LTG (ELOS of 10-14 days)  Skilled Therapeutic Interventions: Pt seen for skilled ST with focus on speech and dysphagia goals, son present throughout tx session. Pt participating in trials of ice chips and thin liquid via tsp and cup. Pt independently able to recall use of chin tuck, however benefits from min A verbal cues throughout trials to utilize. Pt consuming 6oz thin liquid with 1 episode immediate weak throat clear. Pt, son and SLP discussing hx of dysphagia and results of previous MBS (silent aspiration) and agreed that patient would benefit from another instrumental swallow examination before discharge to determine safest diet recommendations. SLP facilitating yes/no questions related to wants/needs and simple conversation by providing extra time and repetition to reduce frustration and increase expressive language skills. Pt left in bed with alarm set and all needs within reach. Cont ST POC.   Pain Pain Assessment Pain Scale: 0-10 Pain Score: 0-No pain  Therapy/Group: Individual Therapy  Dewaine Conger 02/12/2021, 3:05 PM

## 2021-02-12 NOTE — Progress Notes (Signed)
Physical Therapy Session Note  Patient Details  Name: Douglas Walker MRN: [id number] Date of Birth: Nov 29, 1925  Today's Date: 02/12/2021 PT Individual Time: 0900-0956 PT Individual Time Calculation (min): 56 min   Short Term Goals: Week 1:  PT Short Term Goal 1 (Week 1): pt to demonstrate supervision supine<>sit PT Short Term Goal 1 - Progress (Week 1): Met PT Short Term Goal 2 (Week 1): pt to demonstrate CGA sit<>stand with LRAD PT Short Term Goal 2 - Progress (Week 1): Met PT Short Term Goal 3 (Week 1): pt to demonstrate gait with LRAD for 200' at [facility] PT Short Term Goal 3 - Progress (Week 1): Met Week 2:  PT Short Term Goal 1 (Week 2): pt to demonstrate supervision for transfers with LRAD PT Short Term Goal 2 (Week 2): pt to demonstrate supervision for gait with LRAD 300'  Skilled Therapeutic Interventions/Progress Updates:    pt received in bed and agreeable to therapy. Pt directed in supine>sit supervision without hospital bed functions, Sit to stand supervision to Rolling walker and gait training with Rolling walker to restroom at supervision. Pt (+) bladder void supervision for pants management and hygiene. Pt directed in gait training with Rolling walker for 350' supervision with VC for trunk extension. Pt then directed in cone weaving with Rolling walker 4x4 cones with emphasis on tighter turning and improved balance reactions grossly supervision. Pt directed in gait training back to room for BM, supervision-CGA 350'. Pt then directed in toilet transfer with Rolling walker supervision and supervision for clothing management and had small BM however required max A for pericare. Pt then supervision for pulling pants over hips, gait training to sink 10' supervision with VC for walker proximity for safety, supervision for hand hygiene. Pt then directed in gait in room 10' to bedside at supervision. Pt sat at EOB for brief rest break and pt handing off to OT for her session. Pt left sitting  EOB, OT present. Overall pt has improved with tolerance to activity, gait training and standing balance.   Therapy Documentation Precautions:  Precautions Precautions: Fall Precaution Comments: Aphasic, HOH, legally blind in L eye. Restrictions Weight Bearing Restrictions: No General:   Vital Signs:   Pain: Pain Assessment Pain Scale: Faces Faces Pain Scale: Hurts a little bit Pain Type: Acute pain Pain Location: Head Pain Descriptors / Indicators: Headache Pain Onset: On-going Pain Intervention(s): Medication (See eMAR) (tylenol given) Mobility:   Locomotion :    Trunk/Postural Assessment :    Balance:   Exercises:   Other Treatments:      Therapy/Group: Individual Therapy  [name] 02/12/2021, 12:17 PM

## 2021-02-12 NOTE — Progress Notes (Signed)
Occupational Therapy Session Note  Patient Details  Name: Douglas Walker MRN: [id number] Date of Birth: December 15, 1925  Today's Date: 02/12/2021 OT Individual Time: 1001-1059 OT Individual Time Calculation (min): 58 min   Short Term Goals: Week 2:  OT Short Term Goal 1 (Week 2): STGs=LTGs due to ELOS  Skilled Therapeutic Interventions/Progress Updates:    [name] via PT handoff with no c/o pain, started session by showing OT the cards he received, requiring Min A overall to retrieve cards from envelope and place them back into envelope after he listened to the sounds they made which appeared to brighten his affect. Pt then ambulated ~480 ft using RW with close supervision-CGA assist, verbal and tactile cues for standing closer to walker. After taking 1 seated rest break in the [employer], looking outside of the bright sunny windows, he ambulated the same distance back to his room. With HOB elevated in bed he consumed nectar thickened cranberry juice with full supervision with verbal/tactile cues for chin tuck. No s/s aspiration. We conversed about Valentine's Day for orientation/verbal expression with pt very motivated to tell OT about his wife Douglas Walker, how they met and how she was a "blessing" in his life. Pt becoming teary and was provided with emotional support and encouragement at this time. He remained in bed at close of session, left with all needs within reach and bed alarm set.    Therapy Documentation Precautions:  Precautions Precautions: Fall Precaution Comments: Aphasic, HOH, legally blind in L eye. Restrictions Weight Bearing Restrictions: No ADL: ADL Eating: Not assessed Grooming: Contact guard Where Assessed-Grooming: Standing at sink Upper Body Bathing: Contact guard Where Assessed-Upper Body Bathing: Standing at sink Lower Body Bathing: Contact guard Where Assessed-Lower Body Bathing: Sitting at sink,Standing at sink Upper Body Dressing: Contact guard Where  Assessed-Upper Body Dressing: Standing at sink Lower Body Dressing: Moderate assistance Where Assessed-Lower Body Dressing: Standing at sink,Sitting at sink Toileting: Not assessed Tub/Shower Transfer: Not assessed      Therapy/Group: Individual Therapy  Micholas Drumwright A Isley Zinni 02/12/2021, 12:01 PM

## 2021-02-13 MED ORDER — FLUOCINONIDE 0.05 % EX CREA
TOPICAL_CREAM | Freq: Three times a day (TID) | CUTANEOUS | Status: DC
  Administered 2021-02-13: 1 via TOPICAL
  Filled 2021-02-13: qty 15

## 2021-02-13 NOTE — Discharge Summary (Signed)
Physician Discharge Summary  Patient ID: Douglas Walker MRN: [id number] DOB/AGE: [birth year] 87 y.o.  Admit date: 02/01/2021 Discharge date: 02/15/2021  Discharge Diagnoses:  Principal Problem:   Left middle cerebral artery stroke [facility]) Active Problems:   Dysphagia, post-stroke   Chronic diastolic congestive heart failure (HCC)   Chronic pain syndrome CKD stage III PAF/pacemaker Mood stabilization Essential tremor BPH Legally blind Hypothyroidism History of ESBL   Discharged Condition: Stable  Significant Diagnostic Studies: CT Code Stroke CTA Head W/WO contrast  Result Date: 01/28/2021 CLINICAL DATA:  Facial droop.  Acute stroke suspected EXAM: CT ANGIOGRAPHY HEAD AND NECK CT PERFUSION BRAIN TECHNIQUE: Multidetector CT imaging of the head and neck was performed using the standard protocol during bolus administration of intravenous contrast. Multiplanar CT image reconstructions and MIPs were obtained to evaluate the vascular anatomy. Carotid stenosis measurements (when applicable) are obtained utilizing NASCET criteria, using the distal internal carotid diameter as the denominator. Multiphase CT imaging of the brain was performed following IV bolus contrast injection. Subsequent parametric perfusion maps were calculated using RAPID software. CONTRAST:  Dose is currently not known COMPARISON:  Head CT from earlier today FINDINGS: CTA NECK FINDINGS Aortic arch: Atheromatous plaque with 3 vessel branching. Right carotid system: Mild for age atheromatous plaque mainly at the bifurcation where there is mild proximal ICA narrowing mainly due to wasting rather than luminal plaque buildup. No ulceration. Left carotid system: Atheromatous wall thickening of the common carotid and proximal ICA, overall mild for age. No stenosis or ulceration. Vertebral arteries: No proximal subclavian stenosis. Mild left vertebral artery dominance. Scattered atheromatous calcifications of the vertebral arteries  without flow limiting stenosis. No beading or dissection. Skeleton: No acute finding Other neck: No evidence of inflammation or mass. Upper chest: Negative Review of the MIP images confirms the above findings CTA HEAD FINDINGS Anterior circulation: Atheromatous calcification of the bilateral carotid siphon. No large vessel occlusion, beading, or proximal stenosis. A left M3/4 branch occlusion is noted, as marked on sagittal reformats. Posterior circulation: Mild left vertebral artery dominance. Vertebral and basilar arteries are smooth and widely patent. Fetal type right PCA. No branch occlusion, beading, or aneurysm Venous sinuses: Diffusely patent Anatomic variants: As above Review of the MIP images confirms the above findings CT Brain Perfusion Findings: ASPECTS: 10 CBF (<30%) Volume: 29mL Perfusion (Tmax>6.0s) volume: 79mL Mismatch Volume: 23mL IMPRESSION: 1. Left M3/4 branch occlusion with 25 cc of ischemic range cerebral perfusion. 2. No emergent large vessel occlusion. 3. Atherosclerosis without proximal flow limiting stenosis. Electronically Signed   By: Monte Fantasia M.D.   On: 01/28/2021 11:25   DG Chest 1 View  Result Date: 01/28/2021 CLINICAL DATA:  Code stroke. EXAM: CHEST  1 VIEW COMPARISON:  Chest x-ray dated 08/24/2020 FINDINGS: Stable cardiomegaly. Overall cardiomediastinal silhouette is stable in size and configuration. LEFT chest wall pacemaker/ICD apparatus appears stable in position. Lungs are clear.  No pleural effusion or pneumothorax is seen. IMPRESSION: No active cardiopulmonary disease. No evidence of pneumonia or pulmonary edema. Stable cardiomegaly. Electronically Signed   By: Franki Cabot M.D.   On: 01/28/2021 11:46   CT Code Stroke CTA Neck W/WO contrast  Result Date: 01/28/2021 CLINICAL DATA:  Facial droop.  Acute stroke suspected EXAM: CT ANGIOGRAPHY HEAD AND NECK CT PERFUSION BRAIN TECHNIQUE: Multidetector CT imaging of the head and neck was performed using the standard  protocol during bolus administration of intravenous contrast. Multiplanar CT image reconstructions and MIPs were obtained to evaluate the vascular anatomy. Carotid stenosis measurements (when  applicable) are obtained utilizing NASCET criteria, using the distal internal carotid diameter as the denominator. Multiphase CT imaging of the brain was performed following IV bolus contrast injection. Subsequent parametric perfusion maps were calculated using RAPID software. CONTRAST:  Dose is currently not known COMPARISON:  Head CT from earlier today FINDINGS: CTA NECK FINDINGS Aortic arch: Atheromatous plaque with 3 vessel branching. Right carotid system: Mild for age atheromatous plaque mainly at the bifurcation where there is mild proximal ICA narrowing mainly due to wasting rather than luminal plaque buildup. No ulceration. Left carotid system: Atheromatous wall thickening of the common carotid and proximal ICA, overall mild for age. No stenosis or ulceration. Vertebral arteries: No proximal subclavian stenosis. Mild left vertebral artery dominance. Scattered atheromatous calcifications of the vertebral arteries without flow limiting stenosis. No beading or dissection. Skeleton: No acute finding Other neck: No evidence of inflammation or mass. Upper chest: Negative Review of the MIP images confirms the above findings CTA HEAD FINDINGS Anterior circulation: Atheromatous calcification of the bilateral carotid siphon. No large vessel occlusion, beading, or proximal stenosis. A left M3/4 branch occlusion is noted, as marked on sagittal reformats. Posterior circulation: Mild left vertebral artery dominance. Vertebral and basilar arteries are smooth and widely patent. Fetal type right PCA. No branch occlusion, beading, or aneurysm Venous sinuses: Diffusely patent Anatomic variants: As above Review of the MIP images confirms the above findings CT Brain Perfusion Findings: ASPECTS: 10 CBF (<30%) Volume: 51mL Perfusion  (Tmax>6.0s) volume: 52mL Mismatch Volume: 32mL IMPRESSION: 1. Left M3/4 branch occlusion with 25 cc of ischemic range cerebral perfusion. 2. No emergent large vessel occlusion. 3. Atherosclerosis without proximal flow limiting stenosis. Electronically Signed   By: Monte Fantasia M.D.   On: 01/28/2021 11:25   CT Code Stroke Cerebral Perfusion with contrast  Result Date: 01/28/2021 CLINICAL DATA:  Facial droop.  Acute stroke suspected EXAM: CT ANGIOGRAPHY HEAD AND NECK CT PERFUSION BRAIN TECHNIQUE: Multidetector CT imaging of the head and neck was performed using the standard protocol during bolus administration of intravenous contrast. Multiplanar CT image reconstructions and MIPs were obtained to evaluate the vascular anatomy. Carotid stenosis measurements (when applicable) are obtained utilizing NASCET criteria, using the distal internal carotid diameter as the denominator. Multiphase CT imaging of the brain was performed following IV bolus contrast injection. Subsequent parametric perfusion maps were calculated using RAPID software. CONTRAST:  Dose is currently not known COMPARISON:  Head CT from earlier today FINDINGS: CTA NECK FINDINGS Aortic arch: Atheromatous plaque with 3 vessel branching. Right carotid system: Mild for age atheromatous plaque mainly at the bifurcation where there is mild proximal ICA narrowing mainly due to wasting rather than luminal plaque buildup. No ulceration. Left carotid system: Atheromatous wall thickening of the common carotid and proximal ICA, overall mild for age. No stenosis or ulceration. Vertebral arteries: No proximal subclavian stenosis. Mild left vertebral artery dominance. Scattered atheromatous calcifications of the vertebral arteries without flow limiting stenosis. No beading or dissection. Skeleton: No acute finding Other neck: No evidence of inflammation or mass. Upper chest: Negative Review of the MIP images confirms the above findings CTA HEAD FINDINGS Anterior  circulation: Atheromatous calcification of the bilateral carotid siphon. No large vessel occlusion, beading, or proximal stenosis. A left M3/4 branch occlusion is noted, as marked on sagittal reformats. Posterior circulation: Mild left vertebral artery dominance. Vertebral and basilar arteries are smooth and widely patent. Fetal type right PCA. No branch occlusion, beading, or aneurysm Venous sinuses: Diffusely patent Anatomic variants: As above Review of  the MIP images confirms the above findings CT Brain Perfusion Findings: ASPECTS: 10 CBF (<30%) Volume: 19mL Perfusion (Tmax>6.0s) volume: 41mL Mismatch Volume: 2mL IMPRESSION: 1. Left M3/4 branch occlusion with 25 cc of ischemic range cerebral perfusion. 2. No emergent large vessel occlusion. 3. Atherosclerosis without proximal flow limiting stenosis. Electronically Signed   By: Monte Fantasia M.D.   On: 01/28/2021 11:25   DG Swallowing Func-Speech Pathology  Result Date: 02/14/2021 Objective Swallowing Evaluation: Type of Study: MBS-Modified Barium Swallow Study  Patient Details Name: Douglas Walker MRN: [id number] Date of Birth: 1925/12/13 Today's Date: 02/14/2021 Time: SLP Start Time (ACUTE ONLY): 4098 -SLP Stop Time (ACUTE ONLY): 0942 SLP Time Calculation (min) (ACUTE ONLY): 17 min Past Medical History: Past Medical History: Diagnosis Date . Arthritis   shoulders and back . Cancer ([place])   skin cancers . Chronic low back pain 12/15/2017 . CKD (chronic kidney disease) stage 3, GFR 30-59 ml/min (HCC)  . Coronary artery disease  . Dysrhythmia    Paroxysmal atrial fibrillation; Tachycardia-bradycardia syndrome . Essential tremor 02/28/2016 . GERD (gastroesophageal reflux disease)  . Hernia  . History of hiatal hernia  . Hyperlipemia  . Hypertension  . Macular degeneration  . Neuromuscular disorder (HCC)   neuropathy . Paroxysmal atrial fibrillation ([place]) 10/28/2013 . Presence of permanent cardiac pacemaker 12/04/2018 . Tachycardia-bradycardia syndrome ([place])  09/13/2014 . TIA (transient ischemic attack)  Past Surgical History: Past Surgical History: Procedure Laterality Date . APPENDECTOMY   . EYE SURGERY   . HERNIA REPAIR   . HERNIA REPAIR   . INSERT / REPLACE / REMOVE PACEMAKER  12/04/2018 . IRRIGATION AND DEBRIDEMENT ABSCESS Right 07/24/2020  Procedure: IRRIGATION AND DEBRIDEMENT HEMATOMA;  Surgeon: Coralie Keens, MD;  Location: [place];  Service: General;  Laterality: Right; . MASS EXCISION Right 07/20/2020  Procedure: EXCISION OF RIGHT CHEST WALL MASS;  Surgeon: Stark Klein, MD;  Location: [place];  Service: General;  Laterality: Right; . PACEMAKER IMPLANT N/A 12/04/2018  Procedure: PACEMAKER IMPLANT;  Surgeon: Evans Lance, MD;  Location: [place] [facility];  Service: Cardiovascular;  Laterality: N/A; HPI: Patient is a 87 y.o. mqale with PMH: paroxysmal afib, not on anticoagulants due to frequent falls, peripheral neuropathy, essential tremor, HTN, CKD stage IIIB, who presented to hospital from home (lives with son) with AMS. Son had reported sudden onset confusion, unable to talk , some drooling and wobbly when walking. CT head did not find anything significant, CTA showed occluded left M3/M4 but no intervention due to size of branch.  Subjective: upright in chair at bedside, aphasia noted, very HOH Assessment / Plan / Recommendation CHL IP CLINICAL IMPRESSIONS 02/14/2021 Clinical Impression Patient presents with slightly improved swallow function as compared to most recent MBS 6 days ago. He did not exhibit any aspiration with nectar thick liquids, but continues with swallow initiation delay to level of vallecular sinus. Patient continues with silent aspiration of trace-min amount with thin liquids via cup and spoon and without any benefit from chin tuck posture. Aspiration events with thin liquids occured more frequently and slightly increased amount (previously trace and today trace to min). He exhibited mild delays of mastication of regular solids but no  significant amount of oral or pharyngeal residuals with puree solids, regular solids, thin liquids or nectar thick liquids. SLP Visit Diagnosis Dysphagia, oropharyngeal phase (R13.12) Attention and concentration deficit following -- Frontal lobe and executive function deficit following -- Impact on safety and function Moderate aspiration risk;Mild aspiration risk   CHL IP TREATMENT RECOMMENDATION  02/14/2021 Treatment Recommendations Therapy as outlined in treatment plan below   Prognosis 02/08/2021 Prognosis for Safe Diet Advancement Fair Barriers to Reach Goals -- Barriers/Prognosis Comment -- CHL IP DIET RECOMMENDATION 02/14/2021 SLP Diet Recommendations Dysphagia 3 (Mech soft) solids;Nectar thick liquid Liquid Administration via Cup;No straw Medication Administration Whole meds with puree Compensations Minimize environmental distractions;Slow rate;Small sips/bites Postural Changes Seated upright at 90 degrees   CHL IP OTHER RECOMMENDATIONS 02/14/2021 Recommended Consults -- Oral Care Recommendations Oral care BID Other Recommendations --   CHL IP FOLLOW UP RECOMMENDATIONS 02/14/2021 Follow up Recommendations 24 hour supervision/assistance;Home health SLP   CHL IP FREQUENCY AND DURATION 01/30/2021 Speech Therapy Frequency (ACUTE ONLY) min 2x/week Treatment Duration --      CHL IP ORAL PHASE 02/14/2021 Oral Phase Impaired Oral - Pudding Teaspoon -- Oral - Pudding Cup -- Oral - Honey Teaspoon -- Oral - Honey Cup -- Oral - Nectar Teaspoon NT Oral - Nectar Cup Reduced posterior propulsion Oral - Nectar Straw -- Oral - Thin Teaspoon Premature spillage Oral - Thin Cup Premature spillage Oral - Thin Straw -- Oral - Puree Reduced posterior propulsion Oral - Mech Soft -- Oral - Regular Reduced posterior propulsion;Impaired mastication Oral - Multi-Consistency -- Oral - Pill NT Oral Phase - Comment --  CHL IP PHARYNGEAL PHASE 02/14/2021 Pharyngeal Phase Impaired Pharyngeal- Pudding Teaspoon -- Pharyngeal -- Pharyngeal- Pudding Cup  -- Pharyngeal -- Pharyngeal- Honey Teaspoon NT Pharyngeal -- Pharyngeal- Honey Cup NT Pharyngeal -- Pharyngeal- Nectar Teaspoon NT Pharyngeal -- Pharyngeal- Nectar Cup Delayed swallow initiation-vallecula;Pharyngeal residue - valleculae;Pharyngeal residue - posterior pharnyx Pharyngeal Material does not enter airway Pharyngeal- Nectar Straw -- Pharyngeal -- Pharyngeal- Thin Teaspoon Delayed swallow initiation-pyriform sinuses;Penetration/Aspiration before swallow;Penetration/Aspiration during swallow Pharyngeal Material enters airway, passes BELOW cords without attempt by patient to eject out (silent aspiration) Pharyngeal- Thin Cup Delayed swallow initiation-pyriform sinuses;Reduced airway/laryngeal closure;Penetration/Aspiration during swallow;Penetration/Aspiration before swallow Pharyngeal Material enters airway, passes BELOW cords without attempt by patient to eject out (silent aspiration) Pharyngeal- Thin Straw -- Pharyngeal -- Pharyngeal- Puree Delayed swallow initiation-vallecula Pharyngeal -- Pharyngeal- Mechanical Soft -- Pharyngeal -- Pharyngeal- Regular Delayed swallow initiation-vallecula Pharyngeal -- Pharyngeal- Multi-consistency -- Pharyngeal -- Pharyngeal- Pill NT Pharyngeal -- Pharyngeal Comment --  CHL IP CERVICAL ESOPHAGEAL PHASE 02/14/2021 Cervical Esophageal Phase WFL Pudding Teaspoon -- Pudding Cup -- Honey Teaspoon -- Honey Cup -- Nectar Teaspoon -- Nectar Cup -- Nectar Straw -- Thin Teaspoon -- Thin Cup -- Thin Straw -- Puree -- Mechanical Soft -- Regular -- Multi-consistency -- Pill -- Cervical Esophageal Comment -- Sonia Baller, MA, CCC-SLP Speech Therapy             DG Swallowing Func-Speech Pathology  Result Date: 02/08/2021 Objective Swallowing Evaluation: Type of Study: MBS-Modified Barium Swallow Study  Patient Details Name: Douglas Walker MRN: [id number] Date of Birth: [birth year] Today's Date: 02/08/2021 Time: SLP Start Time (ACUTE ONLY): 4917 -SLP Stop Time (ACUTE ONLY): 0942  SLP Time Calculation (min) (ACUTE ONLY): 17 min Past Medical History: Past Medical History: Diagnosis Date . Arthritis   shoulders and back . Cancer ([place])   skin cancers . Chronic low back pain 12/15/2017 . CKD (chronic kidney disease) stage 3, GFR 30-59 ml/min (HCC)  . Coronary artery disease  . Dysrhythmia    Paroxysmal atrial fibrillation; Tachycardia-bradycardia syndrome . Essential tremor 02/28/2016 . GERD (gastroesophageal reflux disease)  . Hernia  . History of hiatal hernia  . Hyperlipemia  . Hypertension  . Macular degeneration  . Neuromuscular disorder (HCC)   neuropathy .  Paroxysmal atrial fibrillation ([place]) 10/28/2013 . Presence of permanent cardiac pacemaker 12/04/2018 . Tachycardia-bradycardia syndrome ([place]) 09/13/2014 . TIA (transient ischemic attack)  Past Surgical History: Past Surgical History: Procedure Laterality Date . APPENDECTOMY   . EYE SURGERY   . HERNIA REPAIR   . HERNIA REPAIR   . INSERT / REPLACE / REMOVE PACEMAKER  12/04/2018 . IRRIGATION AND DEBRIDEMENT ABSCESS Right 07/24/2020  Procedure: IRRIGATION AND DEBRIDEMENT HEMATOMA;  Surgeon: Coralie Keens, MD;  Location: [place];  Service: General;  Laterality: Right; . MASS EXCISION Right 07/20/2020  Procedure: EXCISION OF RIGHT CHEST WALL MASS;  Surgeon: Stark Klein, MD;  Location: [place];  Service: General;  Laterality: Right; . PACEMAKER IMPLANT N/A 12/04/2018  Procedure: PACEMAKER IMPLANT;  Surgeon: Evans Lance, MD;  Location: [place] [facility];  Service: Cardiovascular;  Laterality: N/A; HPI: Patient is a 87 y.o. mqale with PMH: paroxysmal afib, not on anticoagulants due to frequent falls, peripheral neuropathy, essential tremor, HTN, CKD stage IIIB, who presented to hospital from home (lives with son) with AMS. Son had reported sudden onset confusion, unable to talk , some drooling and wobbly when walking. CT head did not find anything significant, CTA showed occluded left M3/M4 but no intervention due to size of branch.  Subjective:  upright in chair at bedside, aphasia noted, very HOH Assessment / Plan / Recommendation CHL IP CLINICAL IMPRESSIONS 02/08/2021 Clinical Impression Patient presents with a moderate sensory-motor based dysphagia resulting in silent aspiration of thin liquids and nectar thick liquids. Oral phase was mildly impaired with decreased mastication of regular solids and mild delay with oral transit of puree and regular solid boluses. He exhibited swallow initiation delay to level of vallecular sinus with puree solids,regular solids and honey thick liquids and delays to pyriform sinus with thin and nectar thick liquids. Aspiration events occured with thin liquids and nectar thick liquids during swallow secondary to very poor epiglottic deflection and airway closure.  He exhibited mild aspiration or thin liquids which reduced to trace with chin tuck posture. Aspiration with nectar thick liquids occured slightly less than 50% of the time, but when aspiration event occured, amount of bolus aspirated was moderate. Chin tuck posture prevented aspiration and penetration with cup sips of nectar thick liquids. Patient unable to clear any aspirate with cued cough. Trace residuals remained briefly in posterior pharyngeal wall with initial swallow of regular solids, but trace to no residuals remained in pharynx post swallows of puree solids, nectar thick liquids and thin liquids. SLP Visit Diagnosis -- Attention and concentration deficit following -- Frontal lobe and executive function deficit following -- Impact on safety and function --   CHL IP TREATMENT RECOMMENDATION 02/08/2021 Treatment Recommendations Therapy as outlined in treatment plan below   Prognosis 02/08/2021 Prognosis for Safe Diet Advancement Fair Barriers to Reach Goals -- Barriers/Prognosis Comment -- CHL IP DIET RECOMMENDATION 02/08/2021 SLP Diet Recommendations Dysphagia 2 (Fine chop) solids Liquid Administration via Cup;No straw Medication Administration Whole meds with  puree Compensations Minimize environmental distractions;Slow rate;Small sips/bites;Chin tuck Postural Changes Seated upright at 90 degrees   CHL IP OTHER RECOMMENDATIONS 02/08/2021 Recommended Consults -- Oral Care Recommendations Oral care BID Other Recommendations --   CHL IP FOLLOW UP RECOMMENDATIONS 02/08/2021 Follow up Recommendations 24 hour supervision/assistance   CHL IP FREQUENCY AND DURATION 01/30/2021 Speech Therapy Frequency (ACUTE ONLY) min 2x/week Treatment Duration --      CHL IP ORAL PHASE 02/08/2021 Oral Phase Impaired Oral - Pudding Teaspoon -- Oral - Pudding Cup -- Oral -  Honey Teaspoon -- Oral - Honey Cup -- Oral - Nectar Teaspoon WFL Oral - Nectar Cup Reduced posterior propulsion Oral - Nectar Straw -- Oral - Thin Teaspoon WFL Oral - Thin Cup Premature spillage Oral - Thin Straw -- Oral - Puree Reduced posterior propulsion Oral - Mech Soft -- Oral - Regular Reduced posterior propulsion;Impaired mastication Oral - Multi-Consistency -- Oral - Pill Reduced posterior propulsion Oral Phase - Comment --  CHL IP PHARYNGEAL PHASE 02/08/2021 Pharyngeal Phase Impaired Pharyngeal- Pudding Teaspoon -- Pharyngeal -- Pharyngeal- Pudding Cup -- Pharyngeal -- Pharyngeal- Honey Teaspoon Delayed swallow initiation-vallecula Pharyngeal Material does not enter airway Pharyngeal- Honey Cup Delayed swallow initiation-vallecula;Pharyngeal residue - valleculae Pharyngeal Material does not enter airway Pharyngeal- Nectar Teaspoon Delayed swallow initiation-vallecula;Reduced airway/laryngeal closure;Penetration/Aspiration during swallow;Trace aspiration Pharyngeal Material enters airway, passes BELOW cords then ejected out Pharyngeal- Nectar Cup Delayed swallow initiation-pyriform sinuses;Reduced airway/laryngeal closure;Penetration/Aspiration during swallow;Moderate aspiration;Compensatory strategies attempted (with notebox) Pharyngeal Material enters airway, passes BELOW cords without attempt by patient to eject out (silent  aspiration) Pharyngeal- Nectar Straw -- Pharyngeal -- Pharyngeal- Thin Teaspoon Delayed swallow initiation-pyriform sinuses;Trace aspiration Pharyngeal Material enters airway, passes BELOW cords without attempt by patient to eject out (silent aspiration) Pharyngeal- Thin Cup Delayed swallow initiation-pyriform sinuses Pharyngeal Material enters airway, passes BELOW cords without attempt by patient to eject out (silent aspiration) Pharyngeal- Thin Straw -- Pharyngeal -- Pharyngeal- Puree Delayed swallow initiation-vallecula Pharyngeal -- Pharyngeal- Mechanical Soft -- Pharyngeal -- Pharyngeal- Regular Delayed swallow initiation-vallecula;Pharyngeal residue - posterior pharnyx Pharyngeal -- Pharyngeal- Multi-consistency -- Pharyngeal -- Pharyngeal- Pill Delayed swallow initiation-vallecula Pharyngeal -- Pharyngeal Comment --  CHL IP CERVICAL ESOPHAGEAL PHASE 02/08/2021 Cervical Esophageal Phase WFL Pudding Teaspoon -- Pudding Cup -- Honey Teaspoon -- Honey Cup -- Nectar Teaspoon -- Nectar Cup -- Nectar Straw -- Thin Teaspoon -- Thin Cup -- Thin Straw -- Puree -- Mechanical Soft -- Regular -- Multi-consistency -- Pill -- Cervical Esophageal Comment -- Sonia Baller, MA, CCC-SLP Speech Therapy             ECHOCARDIOGRAM COMPLETE  Result Date: 01/28/2021    ECHOCARDIOGRAM REPORT   Patient Name:   Douglas Walker Date of Exam: 01/28/2021 Medical Rec #:  [id number]          Height:       70.5 in Accession #:    [id number]         Weight:       203.5 lb Date of Birth:  1925/11/18          BSA:          2.114 m Patient Age:    44 years           BP:           176/81 mmHg Patient Gender: M                  HR:           70 bpm. Exam Location:  Inpatient Procedure: 2D Echo Indications:    TIA 435.9  History:        Patient has prior history of Echocardiogram examinations, most                 recent 05/19/2015. Chronic kidney disease, Arrythmias:Atrial                 Fibrillation; Risk Factors:Hypertension.  Sonographer:     Johny Chess Referring Phys: [id number] Lequita Halt  Sonographer Comments: Image acquisition challenging due  to uncooperative patient. IMPRESSIONS  1. Left ventricular ejection fraction, by estimation, is 50 to 55%. The left ventricle has low normal function. The left ventricle has no regional wall motion abnormalities. Left ventricular diastolic function could not be evaluated.  2. Right ventricular systolic function is mildly reduced. The right ventricular size is normal. There is moderately elevated pulmonary artery systolic pressure.  3. Left atrial size was moderately dilated.  4. Right atrial size was moderately dilated.  5. The mitral valve is normal in structure. No evidence of mitral valve regurgitation.  6. The aortic valve is tricuspid. Aortic valve regurgitation is mild. Mild aortic valve sclerosis is present, with no evidence of aortic valve stenosis.  7. Aortic dilatation noted. There is moderate dilatation of the ascending aorta, measuring 47 mm.  8. The inferior vena cava is dilated in size with <50% respiratory variability, suggesting right atrial pressure of 15 mmHg. Comparison(s): Prior images unable to be directly viewed, comparison made by report only. Ascending aorta dilation has worsened since 2019 (but measurement is similar to CT angio report from may 2021). FINDINGS  Left Ventricle: Left ventricular ejection fraction, by estimation, is 50 to 55%. The left ventricle has low normal function. The left ventricle has no regional wall motion abnormalities. The left ventricular internal cavity size was normal in size. There is no left ventricular hypertrophy. Abnormal (paradoxical) septal motion, consistent with RV pacemaker. Left ventricular diastolic function could not be evaluated due to atrial fibrillation. Left ventricular diastolic function could not be evaluated. Right Ventricle: The right ventricular size is normal. No increase in right ventricular wall thickness. Right ventricular  systolic function is mildly reduced. There is moderately elevated pulmonary artery systolic pressure. The tricuspid regurgitant velocity is 2.76 m/s, and with an assumed right atrial pressure of 15 mmHg, the estimated right ventricular systolic pressure is 07.6 mmHg. Left Atrium: Left atrial size was moderately dilated. Right Atrium: Right atrial size was moderately dilated. Pericardium: There is no evidence of pericardial effusion. Mitral Valve: The mitral valve is normal in structure. No evidence of mitral valve regurgitation. Tricuspid Valve: The tricuspid valve is normal in structure. Tricuspid valve regurgitation is mild. Aortic Valve: The aortic valve is tricuspid. Aortic valve regurgitation is mild. Mild aortic valve sclerosis is present, with no evidence of aortic valve stenosis. Pulmonic Valve: The pulmonic valve was not well visualized. Pulmonic valve regurgitation is not visualized. Aorta: Aortic dilatation noted. There is moderate dilatation of the ascending aorta, measuring 47 mm. Venous: The inferior vena cava is dilated in size with less than 50% respiratory variability, suggesting right atrial pressure of 15 mmHg. IAS/Shunts: No atrial level shunt detected by color flow Doppler. Additional Comments: A pacer wire is visualized.  LEFT VENTRICLE PLAX 2D LVIDd:         4.70 cm Diastology LVIDs:         3.40 cm LV e' medial:    5.87 cm/s LV PW:         1.30 cm LV E/e' medial:  21.0 LV IVS:        1.00 cm LV e' lateral:   10.40 cm/s                        LV E/e' lateral: 11.8  RIGHT VENTRICLE            IVC RV S prime:     8.38 cm/s  IVC diam: 2.40 cm TAPSE (M-mode): 1.1 cm LEFT ATRIUM  Index       RIGHT ATRIUM           Index LA diam:        4.20 cm 1.99 cm/m  RA Area:     16.00 cm LA Vol (A2C):   63.8 ml 30.18 ml/m RA Volume:   41.30 ml  19.54 ml/m LA Vol (A4C):   67.7 ml 32.03 ml/m LA Biplane Vol: 65.7 ml 31.08 ml/m  AORTIC VALVE LVOT Vmax:   97.90 cm/s LVOT Vmean:  62.500 cm/s LVOT  VTI:    0.228 m  AORTA Ao Asc diam: 4.70 cm MITRAL VALVE                TRICUSPID VALVE MV Area (PHT): 3.60 cm     TR Peak grad:   30.5 mmHg MV Decel Time: 211 msec     TR Vmax:        276.00 cm/s MV E velocity: 123.00 cm/s MV A velocity: 33.10 cm/s   SHUNTS MV E/A ratio:  3.72         Systemic VTI: 0.23 m Dani Gobble Croitoru MD Electronically signed by Sanda Klein MD Signature Date/Time: 01/28/2021/2:10:39 PM    Final    CT HEAD CODE STROKE WO CONTRAST  Result Date: 01/28/2021 CLINICAL DATA:  Code stroke. Facial droop and altered mental status, side not specified EXAM: CT HEAD WITHOUT CONTRAST TECHNIQUE: Contiguous axial images were obtained from the base of the skull through the vertex without intravenous contrast. COMPARISON:  07/18/2015 FINDINGS: Brain: No evidence of acute infarction, hemorrhage, hydrocephalus, extra-axial collection or mass lesion/mass effect. Generalized atrophy. Moderate chronic small vessel ischemia in the cerebral white matter. Vascular: No definite hyperdense vessel. A left M2 branch appears prominent in density on coronal reformats but not on the other projections. Skull: Normal. Negative for fracture or focal lesion. Sinuses/Orbits: No acute finding. Other: These results were communicated to Dr. Curly Shores at 11:00 amon 1/30/2022by text page via the [facility] messaging system. ASPECTS [facility] Stroke Program Early CT Score) - Ganglionic level infarction (caudate, lentiform nuclei, internal capsule, insula, M1-M3 cortex): 7 - Supraganglionic infarction (M4-M6 cortex): 3 Total score (0-10 with 10 being normal): 10 IMPRESSION: 1. Aging brain without acute finding. 2. ASPECTS is 10 Electronically Signed   By: Monte Fantasia M.D.   On: 01/28/2021 11:02    Labs:  Basic Metabolic Panel: Recent Labs  Lab 02/09/21 0507 02/10/21 0525 02/12/21 0557 02/14/21 0833  NA 140 139 139 140  K 4.2 4.0 4.0 3.7  CL 107 105 105 105  CO2 25 26 26 27   GLUCOSE 92 101* 96 110*  BUN 26* 24* 25* 22   CREATININE 1.33* 1.27* 1.24 1.31*  CALCIUM 9.0 8.9 8.9 8.9    CBC: Recent Labs  Lab 02/10/21 0525 02/12/21 0557  WBC 5.3 5.2  NEUTROABS 3.0 2.8  HGB 10.8* 11.1*  HCT 34.0* 34.7*  MCV 100.3* 100.3*  PLT 131* 131*    CBG: No results for input(s): GLUCAP in the last 168 hours.  Family history.  Sister with blindness.  Mother with GI issues.  Denies any colon cancer esophageal cancer rectal cancer  Brief HPI:   Douglas Walker is a 87 y.o. right-handed male with history of ESBL 07/2020, essential tremor maintained on primidone, chronic kidney disease stage III legally blind diastolic congestive heart failure, CAD, PAF on Coumadin in the past but discontinued due to hematoma on his back, hypertension, hyperlipidemia, TIA, tachycardia syndrome with permanent pacemaker.  Per chart  review lives with son 1 level home.  Family assistance as needed.  Uses a rolling walker at baseline.  He attends to his own ADLs independently.  Presented 01/28/2021 with right facial droop and aphasia of acute onset.  Cranial CT scan negative for acute changes.  CT angiogram of head and neck no emergent large vessel occlusion.  Left M3/4 branch occlusion with 25 cc of ischemic range cerebral perfusion.  Echocardiogram with ejection fraction of 50 to 55% no wall motion abnormalities.  Admission chemistries unremarkable except INR 1.1 glucose 105 BUN 34 creatinine 1.54 lactic acid 1.0 ammonia level 21 urine drug screen positive barbiturates, SARS coronavirus negative.  Currently maintained on Eliquis for CVA prophylaxis.  Dysphagia #1 nectar thick liquid diet.  Due to patient's decrease in functional mobility he was admitted for a comprehensive rehab program.   Hospital Course: KAYLUB DETIENNE was admitted to rehab 02/01/2021 for inpatient therapies to consist of PT, ST and OT at least three hours five days a week. Past admission physiatrist, therapy team and rehab RN have worked together to provide customized  collaborative inpatient rehab.  Pertaining to patient's left cortical infarction embolic left MCA.  He remained on Eliquis.  No bleeding episodes.  He would follow-up with neurology services.  Pain managed with use of Topamax scheduled 50 mg twice daily, Lyrica nightly 25 mg, Cymbalta 20 mg daily.  He was using tramadol as needed for breakthrough pain.  Patient with history of CKD stage III and latest creatinine 1.24.  Diastolic congestive heart failure he exhibited no signs of fluid overload he would continue on Lasix 40 mg daily.  Essential tremor Mysoline as advised.  BPH with Flomax and Proscar no dysuria hematuria.  Patient was legally blind but independent with ADLs prior to admission.  Synthroid ongoing for hypothyroidism.  History of ESBL 07/2020 taken off contact precautions per infection control.  He was currently maintained on dysphagia #2 nectar thick liquid diet followed by speech therapy.   Blood pressures were monitored on TID basis and controlled     Rehab course: During patient's stay in rehab weekly team conferences were held to monitor patient's progress, set goals and discuss barriers to discharge. At admission, patient required minimal guard stand pivot transfers minimal assist sit to stand minimal assist 200 feet rolling walker minimal assist lower body dressing minimal assistance upper body dressing.  Physical exam.  Blood pressure 130/67 pulse 69 temperature 98.4 respirations 18 oxygen saturations 98% room air Constitutional.  No acute distress HEENT. Head.  Edentulous Eyes.  Pupils round and reactive to light no discharge.nystagmus Neck.  Supple nontender no JVD without thyromegaly Cardiac regular rate rhythm without extra sounds or murmur heard Abdomen.  Soft nontender positive bowel sounds without rebound Respiratory effort normal no respiratory distress without wheeze Musculoskeletal Comments.  Normal range of motion Patient at least 3/5 in right upper extremity right  lower extremity was moving lifted off the bed. Left upper extremity likely 5 -/5 Left lower extremity good dorsiflexion plantar flexion 5 -/5 Skin.  Ecchymosis in arms bilaterally Neurologic.  Alert hard of hearing makes eye contact with examiner but visually impaired.  Right facial tic/tremor with facial droop. Moderate to severe aphasia only said a few words could not assess sensation due to aphasia.   He/She  has had improvement in activity tolerance, balance, postural control as well as ability to compensate for deficits. He/She has had improvement in functional use RUE/LUE  and RLE/LLE as well as improvement in awareness.  Supine to sit with supervision without hospital bed functions.  Sit to stand supervision to rolling walker and gait training with rolling walker.  Ambulates 350-480 feet supervision.  Patient directed in cone weaving with rolling walker 4 x 4 cones with emphasis on tighter turning.  Toilet transfers rolling walker supervision.  He did require some assist for pericare.  Supervision for pulling up his pants.  Family instructed on dysphagia diet.  Patient was able to communicate simple needs.  Full family teaching completed plan discharge to home       Disposition: Discharged to home    Diet: Dysphagia #2 nectar liquids  Special Instructions: No driving smoking or alcohol  Medications at discharge 1.  Tylenol as needed 2.  Eliquis 5 mg p.o. twice daily 3.  Cymbalta 40 mg p.o. daily 4.  Proscar 5 mg p.o. daily 5.  Synthroid 25 mcg p.o. before breakfast 6.  Lyrica 25 mg p.o. nightly 7.  Mysoline 250 mg p.o. twice daily 8.  Senokot S2 tablets p.o. nightly hold for loose stools 9.  Flomax 0.4 mg p.o. twice daily 10.  Topamax 50 mg p.o. twice daily 11.  Tramadol 50 mg p.o. every 6 hours as needed severe pain 12.  Lasix 40 mg p.o. daily 13.Lidex cream 0.05% as directed  30-35 minutes were spent completing discharge summary and discharge planning Discharge  Instructions    Ambulatory referral to Neurology   Complete by: As directed    An appointment is requested in approximately 4 weeks left MCA infarction   Ambulatory referral to Physical Medicine Rehab   Complete by: As directed    Moderate complexity follow-up 1 to 2 weeks left MCA infarction       Follow-up Information    Lovorn, Jinny Blossom, MD Follow up.   Specialty: Physical Medicine and Rehabilitation Why: Office to call for appointment Contact information: [address] [place] [address] [phone]               Signed: Cathlyn Parsons 02/15/2021, 5:12 AM

## 2021-02-13 NOTE — Progress Notes (Signed)
Occupational Therapy Session Note  Patient Details  Name: Douglas Walker MRN: [id number] Date of Birth: 04/09/1925  Today's Date: 02/13/2021 OT Individual Time: 1779-3903 OT Individual Time Calculation (min): 68 min    Short Term Goals: Week 1:  OT Short Term Goal 1 (Week 1): Pt will complete toilet transfer with CGA OT Short Term Goal 1 - Progress (Week 1): Met OT Short Term Goal 2 (Week 1): Pt will correctly orient his shirt without cues and with 1st presentation OT Short Term Goal 2 - Progress (Week 1): Met OT Short Term Goal 3 (Week 1): Pt will complete LB dressing with Min A sit<stand OT Short Term Goal 3 - Progress (Week 1): Met Week 2:  OT Short Term Goal 1 (Week 2): STGs=LTGs due to ELOS  Skilled Therapeutic Interventions/Progress Updates:    [name] at time of session supine in bed sleeping, but aroused with loud verbal stimuli. Agreeable to OT session, no pain, wanting to take a shower. Supine > sit Supervision, walked to bathroom and transferred to toilet in same manner. Pt did void urine only, Supervision for clothing management. Pt has needed help with hygiene after BM, but not with urine, pt stating he normally sits to complete BM hygiene at home. Walked toilet > shower with supervision with RW, CGA transfer into shower with cues for technique and sequencing. UB/LB bathing with Supervision overall, pt has his own way of doing things despite cues to sit for shower for safety and energy conservation, prefers to stand. States he has grab bars all in shower as well and is aware he needs a seat PRN. Dried off in same manner except assist to dry off his back. Donned briefs from home and pants Supervision before walking to sink for grooming tasks. Sat EOB to drink 2 cranberry juices with full supervision provided. Sit > supine Supervision. Pt was Supervision overall for all ADL, just needs occasional CGA for some ADL transfers. Alarm on call bell in reach.   Therapy  Documentation Precautions:  Precautions Precautions: Fall Precaution Comments: Aphasic, HOH, legally blind in L eye. Restrictions Weight Bearing Restrictions: No     Therapy/Group: Individual Therapy  [name] 02/13/2021, 11:08 AM

## 2021-02-13 NOTE — Progress Notes (Signed)
Pt called to use the bathroom. This nurse immediately goes to pt's room. By the time this nurse is almost to room pt is screaming "I got to go to the bathroom" very loud. Pt's face is red. This nurse takes pt to the bathroom and he makes it to toilet. Offered a urinal in case staff can not immediately take him to the restroom. Pt aggressively yells "no".

## 2021-02-13 NOTE — Progress Notes (Signed)
Physical Therapy Session Note  Patient Details  Name: Douglas Walker MRN: [id number] Date of Birth: 1925-03-25  Today's Date: 02/13/2021 PT Individual Time: 1310-1405 PT Individual Time Calculation (min): 55 min   Short Term Goals: Week 1:  PT Short Term Goal 1 (Week 1): pt to demonstrate supervision supine<>sit PT Short Term Goal 1 - Progress (Week 1): Met PT Short Term Goal 2 (Week 1): pt to demonstrate CGA sit<>stand with LRAD PT Short Term Goal 2 - Progress (Week 1): Met PT Short Term Goal 3 (Week 1): pt to demonstrate gait with LRAD for 200' at [facility] PT Short Term Goal 3 - Progress (Week 1): Met Week 2:  PT Short Term Goal 1 (Week 2): pt to demonstrate supervision for transfers with LRAD PT Short Term Goal 2 (Week 2): pt to demonstrate supervision for gait with LRAD 300'  Skilled Therapeutic Interventions/Progress Updates:    pt received in bed and agreeable to therapy, son present. Son educated on pt's current status, need for supervision for standing mobility, continues to be aphasic. Son agrees and reports no concerns with going home with pt at current status. Pt directed in supine>sit mod I from flat bed; Sit to stand with supervision to Rolling walker and gait training 150' with Rolling walker supervision. Pt then directed in car transfer supervision; ascending and descending ramp with RW CGA and directed in gait training 150' to gym supervision with RW. Pt directed in ascending and descending 12 stairs CGA with 2 rails step through pattern. Pt then directed in standing tolerance and balance activity of underhanded bean bag toss 2x10 CGA with one UE support, no LOB noted VC for trunk extension and reaching in various directions for bags. Pt then directed in gait to return to room, requested to use restroom, supervision for urinating in sitting and clothing management. Pt then directed in gait to sink, supervision for all needs and returned to bed supervision with Rolling walker and  sit>supine to flat bed mod I. Pt left in bed, alarm set, All needs in reach and in good condition. Call light in hand.    Therapy Documentation Precautions:  Precautions Precautions: Fall Precaution Comments: Aphasic, HOH, legally blind in L eye and decreased vision in R eye per son Restrictions Weight Bearing Restrictions: No General:   Vital Signs: Therapy Vitals Temp: 97.7 F (36.5 C) Pulse Rate: 70 Resp: 18 BP: 126/65 Oxygen Therapy SpO2: 100 % Pain: Pain Assessment Pain Scale: 0-10 Pain Score: 0-No pain Mobility: Bed Mobility Bed Mobility: Rolling Right;Rolling Left;Supine to Sit;Sit to Supine;Sitting - Scoot to [employer] of Bed Rolling Right: Independent with assistive device Rolling Left: Independent with assistive device Supine to Sit: Independent with assistive device Sitting - Scoot to Edge of Bed: Independent with assistive device Sit to Supine: Independent with assistive device Transfers Transfers: Sit to Stand;Stand Pivot Transfers Sit to Stand: Supervision/Verbal cueing Stand Pivot Transfers: Supervision/Verbal cueing Stand Pivot Transfer Details: Verbal cues for precautions/safety Transfer (Assistive device): Rolling walker Locomotion : Gait Ambulation: Yes Gait Assistance: Supervision/Verbal cueing Gait Distance (Feet): 300 Feet Assistive device: Rolling walker Gait Assistance Details: Verbal cues for technique;Verbal cues for precautions/safety Gait Gait: Yes Gait Pattern: Impaired Gait Pattern: Trunk flexed Gait velocity: reduced Stairs / Additional Locomotion Stairs: Yes Stairs Assistance: Contact Guard/Touching assist Stair Management Technique: Two rails Number of Stairs: 12 Height of Stairs: 6 Ramp: Contact Guard/touching assist Curb: Cytogeneticist Wheelchair Mobility: No  Trunk/Postural Assessment : Cervical Assessment Cervical Assessment: Exceptions to  WFL (forward head) Thoracic Assessment Thoracic  Assessment: Exceptions to [facility] (kyphotic) Lumbar Assessment Lumbar Assessment: Exceptions to [facility] (posterior pelvic tilt) Postural Control Postural Control: Deficits on evaluation (needs AD for safety during transfers)  Balance: Balance Balance Assessed: Yes Static Sitting Balance Static Sitting - Balance Support: Feet supported Static Sitting - Level of Assistance: 6: Modified independent (Device/Increase time) Dynamic Sitting Balance Dynamic Sitting - Balance Support: Feet supported Dynamic Sitting - Level of Assistance: 6: Modified independent (Device/Increase time) Dynamic Sitting - Balance Activities: Reaching across midline;Forward lean/weight shifting;Lateral lean/weight shifting Static Standing Balance Static Standing - Balance Support: No upper extremity supported Static Standing - Level of Assistance: 5: Stand by assistance Dynamic Standing Balance Dynamic Standing - Balance Support: During functional activity;Left upper extremity supported Dynamic Standing - Level of Assistance: 5: Stand by assistance Dynamic Standing - Balance Activities: Lateral lean/weight shifting;Forward lean/weight shifting Exercises:   Other Treatments:      Therapy/Group: Individual Therapy  [name] 02/13/2021, 2:21 PM

## 2021-02-13 NOTE — Progress Notes (Signed)
Patient ID: Douglas Walker, male   DOB: 13-Jun-1925, 87 y.o.   MRN: [id number] Team Conference Report to Patient/Family  Team Conference discussion was reviewed with the patient and caregiver, including goals, any changes in plan of care and target discharge date.  Patient and caregiver express understanding and are in agreement.  The patient has a target discharge date of 02/15/21.  [name] 02/13/2021, 1:44 PM

## 2021-02-13 NOTE — Patient Care Conference (Signed)
Inpatient RehabilitationTeam Conference and Plan of Care Update Date: 02/13/2021   Time: 11:30 AM    Patient Name: Douglas Walker      Medical Record Number: [id number]  Date of Birth: March 02, 1925 Sex: Male         Room/Bed: 4M08C/4M08C-01 Payor Info: Payor: MEDICARE / Plan: MEDICARE PART A AND B / Product Type: *No Product type* /    Admit Date/Time:  02/01/2021  4:00 PM  Primary Diagnosis:  Left middle cerebral artery stroke [facility])  Hospital Problems: Principal Problem:   Left middle cerebral artery stroke [facility]) Active Problems:   Dysphagia, post-stroke   Chronic diastolic congestive heart failure (HCC)   Chronic pain syndrome    Expected Discharge Date: Expected Discharge Date: 02/15/21  Team Members Present: Physician leading conference: Dr. Courtney Heys Care Coodinator Present: Erlene Quan, BSW;Krista Som Creig Hines, RN, BSN, CRRN Nurse Present: Isla Pence, RN PT Present: Stacy Gardner, PT OT Present: Lillia Corporal, OT PPS Coordinator present : Ileana Ladd, Burna Mortimer, SLP     Current Status/Progress Goal Weekly Team Focus  Bowel/Bladder   Pt continent of B/B LBM 02/12/2021  Continued continence regular BMs every 3 days or less  Assess B/B every shift and PRN   Swallow/Nutrition/ Hydration             ADL's   Supervision overall for bathing, dressing, functional mobility. Continues to be Supervision - Mod A with toileting for BM hygiene  Supervision  ADL retraining, safety awareness, endurance/general strengthening, functional transfers, DC prep   Mobility   supervision-mod I for bed mobility; transfer with RW supervision- CGA; gait training CGA long distances and supervision short distances 300'; standing balance supervision-CGA  supervision bed mob, transfers, gait with RW  balance training, gait training, tolerance to activity, transfers   Communication             Safety/Cognition/ Behavioral Observations            Pain   FACES scale 6/10  Tylenol and tramadol for pain PRN  FACES scale <3/10  Assess pain every shift and PRN   Skin   intact  No breakdown  Assess skin every shift and PRN     Discharge Planning:  Discharging home with family (son, daughter in law and grandchildren)   Team Discussion: Push Nectar thick fluids, refusing nightly IVF's. Started Flonase, rash seems worse, increased intensity of cream. Recheck labs tomorrow morning. Has Tylenol for pain. A&O to person and place, continent B/B, doesn't want to use urinal, has his own way. Left side worse than right with rash. Gave Tramadol this morning for pain and he hasn't called for additional medication. Pain medication was for head, knees, and back. Family education with son. Son was here over the weekend and saw a good session and they are very comfortable with taking care of him. Patient on target to meet rehab goals: At goal level, son has been here a lot. Supervision level overall.   *See Care Plan and progress notes for long and short-term goals.   Revisions to Treatment Plan:   MD would like to send him home on thin liquids if at all possible. Teaching Needs: Family education, medication management, transfer training, gait training, endurance training, skin/wound care, fluid management,  Current Barriers to Discharge: Inaccessible home environment, Decreased caregiver support, Home enviroment access/layout, Wound care, Lack of/limited family support, Medication compliance and Behavior  Possible Resolutions to Barriers: Continue current medications, offer fluids often, provide emotional support to patient and  family.     Medical Summary Current Status: tylenol/tramadol for "hurting all over"; Ox2; drinks nectar thick liquids well; LBM 2/14; continent B/B; rash worse /itching on back- new /higher intensity cream  Barriers to Discharge: Home enviroment access/layout;Behavior;Decreased family/caregiver support;Medical stability;Wound care;Other (comments)   Barriers to Discharge Comments: aphasia- still major limiter; SW- family going to take care of pt- family education- OT/PT done this weekend; rash on back d/c 2/17- Possible Resolutions to [employer] Focus: occ with BM- wants help wiping- but is SBA otherwise; can do BSC at night only; PT- at supervision right now; SLP-will see if can get to regular liquids   Continued Need for Acute Rehabilitation Level of Care: The patient requires daily medical management by a physician with specialized training in physical medicine and rehabilitation for the following reasons: Direction of a multidisciplinary physical rehabilitation program to maximize functional independence : Yes Medical management of patient stability for increased activity during participation in an intensive rehabilitation regime.: Yes Analysis of laboratory values and/or radiology reports with any subsequent need for medication adjustment and/or medical intervention. : Yes   I attest that I was present, lead the team conference, and concur with the assessment and plan of the team.   Cristi Loron 02/13/2021, 5:23 PM

## 2021-02-13 NOTE — Progress Notes (Signed)
[facility] PHYSICAL MEDICINE & REHABILITATION PROGRESS NOTE   Subjective/Complaints:  Pt , per staff, upset about urinal- will not use- insists he has to get up to bathroom, not even BSC.   Pt also c/o hurting all over this AM- rated pain 7/10- wants tylenol d/w nursing to give him prn tylenol.   Per NT, drank 12 oz of nectar thick fluids this AM.    ROS: limited by aphasia/cognition   Objective:   No results found. Recent Labs    02/12/21 0557  WBC 5.2  HGB 11.1*  HCT 34.7*  PLT 131*   Recent Labs    02/12/21 0557  NA 139  K 4.0  CL 105  CO2 26  GLUCOSE 96  BUN 25*  CREATININE 1.24  CALCIUM 8.9    Intake/Output Summary (Last 24 hours) at 02/13/2021 0905 Last data filed at 02/13/2021 0308 Gross per 24 hour  Intake 1440 ml  Output --  Net 1440 ml        Physical Exam: Vital Signs Blood pressure (!) 142/76, pulse 68, temperature 98.4 F (36.9 C), temperature source Oral, resp. rate 16, height 5\' 9"  (1.753 m), weight 93 kg, SpO2 97 %. Constitutional: sitting EOB, finished breakfast, RN and NT in room, NAD HEENT: EOMI, oral mucosa a little dry, but better; nasal congestion a little better  Neck: supple Cardiovascular: RRR- no JVD  Respiratory/Chest: .CTA B/L- no W/R/R- good air movement GI/Abdomen: Soft, NT, ND, (+)BS   Ext: no clubbing, cyanosis, or edema Psych: frustrated due to aphasia, not finding words Skin: Warm and dry.  Rash on back better- almost resolved Scattered ecchymosis  Musc: No edema in extremities.  No tenderness in extremities. Neuro: HOH- slow to arouse Motor: LUE/LLE: 4+/5 RUE/RLE: Appears to be 4/5 proximal distal  Assessment/Plan: 1. Functional deficits which require 3+ hours per day of interdisciplinary therapy in a comprehensive inpatient rehab setting.  Physiatrist is providing close team supervision and 24 hour management of active medical problems listed below.  Physiatrist and rehab team continue to assess barriers to  discharge/monitor patient progress toward functional and medical goals  Care Tool:  Bathing    Body parts bathed by patient: Right arm,Left arm,Chest,Abdomen,Front perineal area,Buttocks,Right upper leg,Left upper leg,Face,Right lower leg,Left lower leg   Body parts bathed by helper: Right lower leg,Left lower leg (due to tight space in shower, was able to wash feet sitting at the sink)     Bathing assist Assist Level: Supervision/Verbal cueing     Upper Body Dressing/Undressing Upper body dressing   What is the patient wearing?: Pull over shirt    Upper body assist Assist Level: Supervision/Verbal cueing    Lower Body Dressing/Undressing Lower body dressing      What is the patient wearing?: Underwear/pull up,Pants     Lower body assist Assist for lower body dressing: Supervision/Verbal cueing     Toileting Toileting Toileting Activity did not occur (Clothing management and hygiene only): N/A (no void or bm)  Toileting assist Assist for toileting: Moderate Assistance - Patient 50 - 74% (mod A for hyiene post BM; supervision for urination)     Transfers Chair/bed transfer  Transfers assist     Chair/bed transfer assist level: Supervision/Verbal cueing     Locomotion Ambulation   Ambulation assist      Assist level: Contact Guard/Touching assist Assistive device: Walker-rolling Max distance: 150   Walk 10 feet activity   Assist     Assist level: Contact Guard/Touching assist Assistive device:  Walker-rolling   Walk 50 feet activity   Assist    Assist level: Contact Guard/Touching assist Assistive device: Walker-rolling    Walk 150 feet activity   Assist    Assist level: Contact Guard/Touching assist Assistive device: Walker-rolling    Walk 10 feet on uneven surface  activity   Assist     Assist level: Minimal Assistance - Patient > 75% Assistive device: Aeronautical engineer Will patient use wheelchair  at discharge?: No             Wheelchair 50 feet with 2 turns activity    Assist            Wheelchair 150 feet activity     Assist          Blood pressure (!) 142/76, pulse 68, temperature 98.4 F (36.9 C), temperature source Oral, resp. rate 16, height 5\' 9"  (1.753 m), weight 93 kg, SpO2 97 %.    Medical Problem List and Plan: 1.  Right hemiparesis and aphasia secondary to left cortical infarct/embolic left MCA  Continue CIR 2.  Antithrombotics: -DVT/anticoagulation: Eliquis             -antiplatelet therapy: N/A 3. Pain Management: Topamax 50 mg twice daily, Lyrica 25 mg nightly, Cymbalta 20 mg daily- c/o sides hurting- will add tramadol prn for severe pain  2/7- on pain regimen- doing well- con't regimen  2/8- wants tylenol- "hurts all over"- will let nursing know- con't regimen-  2/10- said hurting- wants tylenol this AM- con't regimen  2/14- pain controlled when needs tylenol- con't regimen  2/15- pain controlled when takes Tylenol- con't regimen  4. Mood: Provide emotional support  2/10- will increase Duloxetine to 40 mg daily- - due to tearfulness- concerned about mood  2/11- denies side effects- con't regimen             -antipsychotic agents: N/A 5. Neuropsych: This patient is capable of making decisions on his own behalf. 6. Skin/Wound Care: Routine skin checks 7. Fluids/Electrolytes/Nutrition: Routine in and outs. 8.  PAF/pacemaker.  Cardiac rate controlled.  Continue Eliquis 9.  CKD stage IIIB    Creatinine 1.37 on 2/4, labs ordered for Monday  2/7- will order labs for Tuesday 2/8- since don't have today  2/8- Cr 1.01 and BUN 31- slightly dry- but on nectar thick liquids- will recheck Thursday and if rising more, will give IVFs.  2/9- actually started on IVFs 2/8- Cr up slightly- BUN down slightly to 27- will write for IVFs at night for now.   2/11- d/w nursing to push PO fluids more- esp since on nectar thick liquids - BUN stable at 26; but Cr  slightly up at 1.33 from 1.29 (up from 1.21)- pt doesn't want IVFs- will recheck Saturday, and Monday to see if worsening- if gets worse, will NEED IVFs, regardless of pt desire.   2/12 BUN/Cr stable to improved 24/1.27   -continue to encourage fluids   -recheck Monday   2/14- Cr down to 1.24 and BUN stable at 25- push nectar thick liquids- d/w NT  2/15- pushing pt to drink at least 12 oz, if not more with each meal and in between- needs more than normal.  10.  Diastolic congestive heart failure.  Patient on Lasix 60 mg daily prior to admission and has been on hold since admission.  Resume as needed monitor for any signs of fluid overload Filed Weights   02/11/21 0422 02/12/21 0500 02/13/21  0500  Weight: 96.3 kg 96.3 kg 93 kg   Daily weights ordered  2/10-12- weight stable - con't regimen 11.  Essential tremor.  Mysoline 250 mg twice daily- mainly RUE and jaw  Persistent on 2/5  2/8- tremors slightly better this AM-c on't regimen  2/15- still has severe jaw tremors- worse when gets upset- con't regimen 12.  BPH.  Flomax 0.4 mg twice daily, Proscar 5 mg daily 13.  Legally blind.  Patient independent ADLs prior to admission 14.  Hypothyroidism: Synthroid 15.  History of ESBL 07/2020.    2/10- taken off contact precautions per Infection control  16. Apnea episodes  2/4- dropped sats to 84% during apnea episode yesterday- still having apnea- will try and get CPAP for pt- hopefully would wear.   2/8- pt refusing- says doesn't need- will try and get to wear  2/9- still refusing  2/10- will d/c CPAP- pt refuses to use, in spite of description of apnea episodes  2/11- d/c'd CPAP order 17.  Post stroke dysphagia  D1 nectars, advance diet as tolerated  2/7- is doing well with intake including nectar thick liquids- con't regimen  2/8- BUN 31- per staff drinking well, but need to push more fluids PO  2/9- will order night time IVFs since not drinking nectar thick liquids well  2/12 see #9  2/14-  refusing night time IVFs- con't regimen 18. Constipation  2/7- LBM Friday 2/4- will try Miralax today and if not effective, will give Sorbitol.   2/8- LBM 2/6 small- will give Sorbitol today.   2/9- LBM last night after sorbitol  2/12 needs another bmt this weekend   -repeat sorbitol   -schedule senna-s 19. Nasal congestion/itching  2/14- added Flonase 2 sprays each nare daily  2/15- a little better this AM  LOS: 12 days A FACE TO FACE EVALUATION WAS PERFORMED  Brizza Nathanson 02/13/2021, 9:05 AM

## 2021-02-13 NOTE — Progress Notes (Signed)
Physical Therapy Discharge Summary  Patient Details  Name: Douglas Walker MRN: [id number] Date of Birth: [birth year]  Today's Date: 02/14/2021    Patient has met 8 of 8 long term goals due to improved activity tolerance, improved balance, improved postural control, increased strength, decreased pain, improved attention and improved awareness.  Patient to discharge at an ambulatory level Supervision.   Patient's care partner is independent to provide the necessary physical and cognitive assistance at discharge.  Reasons goals not met: n/a  Recommendation:  Patient will benefit from ongoing skilled PT services in home health setting to continue to advance safe functional mobility, address ongoing impairments in strength and balance, and minimize fall risk.  Equipment: No equipment provided  Reasons for discharge: treatment goals met  Patient/family agrees with progress made and goals achieved: Yes  PT Discharge Precautions/Restrictions Precautions Precautions: Fall Precaution Comments: Aphasic, HOH, legally blind in L eye and decreased vision in R eye per son Restrictions Weight Bearing Restrictions: No Pain Pain Assessment Pain Scale: 0-10 Pain Score: 0-No pain Faces Pain Scale: Hurts a little bit Pain Type: Chronic pain Pain Location: Knee Pain Orientation: Right;Left Pain Descriptors / Indicators: Aching Pain Onset: With Activity Pain Intervention(s): Rest Vision/Perception  Perception Perception: Within Functional Limits Praxis Praxis: Intact  Cognition Overall Cognitive Status: Within Functional Limits for tasks assessed Arousal/Alertness: Awake/alert Orientation Level: Oriented to person;Oriented to place;Oriented to time Attention: Sustained;Focused Focused Attention: Impaired Focused Attention Impairment: Verbal complex;Functional complex Sustained Attention: Impaired Sustained Attention Impairment: Verbal complex;Functional complex Selective Attention:  Appears intact Awareness: Impaired Awareness Impairment: Emergent impairment Problem Solving: Impaired Problem Solving Impairment: Verbal complex;Functional complex Executive Function: Technical brewer: Appears intact Safety/Judgment: Impaired (slightly impaired and continues to require supervision) Sensation Sensation Light Touch: Impaired by gross assessment Additional Comments: reports numbness in fingertips and in lower legs due to neuropathy Coordination Gross Motor Movements are Fluid and Coordinated: No Fine Motor Movements are Fluid and Coordinated: No Coordination and Movement Description: Tremulous, most prominant in the Rt UE and face but still able to complete ADL tasks w/ S Motor  Motor Motor: Within Functional Limits Motor - Skilled Clinical Observations: does have baseline essential tremor in R UE and at mouth  Mobility Bed Mobility Bed Mobility: Rolling Right;Rolling Left;Supine to Sit;Sit to Supine;Sitting - Scoot to [employer] of Bed Rolling Right: Independent with assistive device Rolling Left: Independent with assistive device Supine to Sit: Independent with assistive device Sitting - Scoot to Edge of Bed: Independent with assistive device Sit to Supine: Independent with assistive device Transfers Transfers: Sit to Stand;Stand Pivot Transfers Sit to Stand: Supervision/Verbal cueing Stand Pivot Transfers: Supervision/Verbal cueing Stand Pivot Transfer Details: Verbal cues for precautions/safety Transfer (Assistive device): Rolling walker Locomotion  Gait Ambulation: Yes Gait Assistance: Supervision/Verbal cueing Gait Distance (Feet): 300 Feet Assistive device: Rolling walker Gait Assistance Details: Verbal cues for technique;Verbal cues for precautions/safety Gait Gait: Yes Gait Pattern: Impaired Gait Pattern: Trunk flexed Gait velocity: reduced Stairs / Additional Locomotion Stairs: Yes Stairs Assistance: Contact Guard/Touching assist Stair  Management Technique: Two rails Number of Stairs: 12 Height of Stairs: 6 Ramp: Contact Guard/touching assist Curb: Cytogeneticist Wheelchair Mobility: No  Trunk/Postural Assessment  Cervical Assessment Cervical Assessment: Exceptions to [facility] (forward head) Thoracic Assessment Thoracic Assessment: Exceptions to [facility] (kyphotic) Lumbar Assessment Lumbar Assessment: Exceptions to [facility] (posterior pelvic tilt) Postural Control Postural Control: Deficits on evaluation (needs AD for safety during transfers)  Balance Balance Balance Assessed: Yes Standardized Balance Assessment Standardized Balance Assessment: Merrilee Jansky  Balance Test Berg Balance Test Sit to Stand: Able to stand  independently using hands Standing Unsupported: Able to stand safely 2 minutes Sitting with Back Unsupported but Feet Supported on Floor or Stool: Able to sit safely and securely 2 minutes Stand to Sit: Sits safely with minimal use of hands Transfers: Able to transfer safely, definite need of hands Standing Unsupported with Eyes Closed: Able to stand 10 seconds with supervision Standing Ubsupported with Feet Together: Needs help to attain position but able to stand for 30 seconds with feet together From Standing, Reach Forward with Outstretched Arm: Can reach forward >5 cm safely (2") From Standing Position, Pick up Object from Floor: Able to pick up shoe, needs supervision From Standing Position, Turn to Look Behind Over each Shoulder: Turn sideways only but maintains balance Turn 360 Degrees: Needs close supervision or verbal cueing Standing Unsupported, Alternately Place Feet on Step/Stool: Able to complete >2 steps/needs minimal assist Standing Unsupported, One Foot in Front: Loses balance while stepping or standing Standing on One Leg: Unable to try or needs assist to prevent fall Total Score: 31 Static Sitting Balance Static Sitting - Balance Support: Feet supported Static  Sitting - Level of Assistance: 6: Modified independent (Device/Increase time) Dynamic Sitting Balance Dynamic Sitting - Balance Support: Feet supported Dynamic Sitting - Level of Assistance: 6: Modified independent (Device/Increase time) Dynamic Sitting - Balance Activities: Reaching across midline;Forward lean/weight shifting;Lateral lean/weight shifting Static Standing Balance Static Standing - Balance Support: No upper extremity supported Static Standing - Level of Assistance: 5: Stand by assistance Dynamic Standing Balance Dynamic Standing - Balance Support: During functional activity;Left upper extremity supported Dynamic Standing - Level of Assistance: 5: Stand by assistance Dynamic Standing - Balance Activities: Lateral lean/weight shifting;Forward lean/weight shifting Extremity Assessment      RLE Assessment RLE Assessment: Exceptions to [facility] Active Range of Motion (AROM) Comments: WFL RLE Strength RLE Overall Strength: Deficits Right Hip Flexion: 4/5 Right Hip ABduction: 4/5 Right Knee Extension: 4/5 Right Ankle Dorsiflexion: 4+/5 LLE Assessment LLE Assessment: Exceptions to [facility] Active Range of Motion (AROM) Comments: WFL LLE Strength LLE Overall Strength: Deficits Left Hip Flexion: 4/5 Left Hip ABduction: 4-/5 Left Knee Extension: 4/5 Left Ankle Dorsiflexion: 4/5    [name] 02/13/2021, 3:52 PM   Magda Kiel, PT 02/14/2021

## 2021-02-13 NOTE — Progress Notes (Signed)
Physical Therapy Session Note  Patient Details  Name: Douglas Walker MRN: [id number] Date of Birth: July 08, 1925  Today's Date: 02/13/2021 PT Individual Time: 1447-1600 PT Individual Time Calculation (min): 73 min   Short Term Goals: Week 2:  PT Short Term Goal 1 (Week 2): pt to demonstrate supervision for transfers with LRAD PT Short Term Goal 2 (Week 2): pt to demonstrate supervision for gait with LRAD 300'  Skilled Therapeutic Interventions/Progress Updates:    Patient in supine with son in the room just leaving.  Reports "did everything" in previous therapy sessions today.  Patient supine to sit with S.  Sit to stand S to RW.  Ambulated to therapy gym x 160' with close S increased proximity to walker and slight flexed posture.  In gym to complete Berg balance assessment as noted below.  Discussed need for RW at all times for fall prevention.    Patient ambulated around cones with RW with mod cues needed to clear cones on far left side.  Education in need to turn head to visualize obstacles on L If turning.  Patient ambulated to ortho gym x 49' with RW and close S.  Negotiated ramp with RW and CGA.  Then side stepping using rail up and down ramp with cues x 3.  Standing LE therex at counter hip extension, hip abduction, heel raises x 10-15 reps with UE support.  Backward walking to mat for seated rest, no device min A.  Nu Step level 2 UE/LE x 5 minutes cues to stop if knee pain too much, but pt continued then reported knee pain 6/10.  RN made aware and delivered pain medication.  Patient toileted with distant S.  Patient sit to supine S and left with call bell in reach and bed alarm active.   Therapy Documentation Precautions:  Precautions Precautions: Fall Precaution Comments: Aphasic, HOH, legally blind in L eye and decreased vision in R eye per son Restrictions Weight Bearing Restrictions: No Pain: Pain Assessment Pain Scale: 0-10 Pain Score: 0-No pain Faces Pain Scale: Hurts a  little bit Pain Type: Chronic pain Pain Location: Knee Pain Orientation: Right;Left Pain Descriptors / Indicators: Aching Pain Onset: With Activity Pain Intervention(s): Rest  Balance: Balance Balance Assessed: Yes Standardized Balance Assessment Standardized Balance Assessment: Berg Balance Test Berg Balance Test Sit to Stand: Able to stand  independently using hands Standing Unsupported: Able to stand safely 2 minutes Sitting with Back Unsupported but Feet Supported on Floor or Stool: Able to sit safely and securely 2 minutes Stand to Sit: Sits safely with minimal use of hands Transfers: Able to transfer safely, definite need of hands Standing Unsupported with Eyes Closed: Able to stand 10 seconds with supervision Standing Ubsupported with Feet Together: Needs help to attain position but able to stand for 30 seconds with feet together From Standing, Reach Forward with Outstretched Arm: Can reach forward >5 cm safely (2") From Standing Position, Pick up Object from Floor: Able to pick up shoe, needs supervision From Standing Position, Turn to Look Behind Over each Shoulder: Turn sideways only but maintains balance Turn 360 Degrees: Needs close supervision or verbal cueing Standing Unsupported, Alternately Place Feet on Step/Stool: Able to complete >2 steps/needs minimal assist Standing Unsupported, One Foot in Front: Loses balance while stepping or standing Standing on One Leg: Unable to try or needs assist to prevent fall Total Score: 31   Therapy/Group: Individual Therapy  Reginia Naas  Magda Kiel, PT 02/13/2021, 3:18 PM

## 2021-02-14 ENCOUNTER — Inpatient Hospital Stay (HOSPITAL_COMMUNITY): Payer: Medicare Other

## 2021-02-14 LAB — BASIC METABOLIC PANEL
Anion gap: 8 (ref 5–15)
BUN: 22 mg/dL (ref 8–23)
CO2: 27 mmol/L (ref 22–32)
Calcium: 8.9 mg/dL (ref 8.9–10.3)
Chloride: 105 mmol/L (ref 98–111)
Creatinine, Ser: 1.31 mg/dL — ABNORMAL HIGH (ref 0.61–1.24)
GFR, Estimated: 51 mL/min — ABNORMAL LOW (ref >60)
Glucose, Bld: 110 mg/dL — ABNORMAL HIGH (ref 70–99)
Potassium: 3.7 mmol/L (ref 3.5–5.1)
Sodium: 140 mmol/L (ref 135–145)

## 2021-02-14 MED ORDER — TRAMADOL HCL 50 MG PO TABS: 50.0000 mg | ORAL_TABLET | Freq: Four times a day (QID) | ORAL | 0 refills | Status: DC | PRN

## 2021-02-14 MED ORDER — VITAMIN B-12 1000 MCG PO TABS: 1000.0000 ug | ORAL_TABLET | ORAL | 0 refills | Status: DC

## 2021-02-14 MED ORDER — ACETAMINOPHEN 325 MG PO TABS: 650.0000 mg | ORAL_TABLET | ORAL | Status: DC | PRN

## 2021-02-14 MED ORDER — FLUOCINONIDE 0.05 % EX CREA: TOPICAL_CREAM | Freq: Three times a day (TID) | CUTANEOUS | 0 refills | Status: DC

## 2021-02-14 MED ORDER — LEVOTHYROXINE SODIUM 25 MCG PO TABS: 25.0000 ug | ORAL_TABLET | Freq: Every day | ORAL | 1 refills | Status: DC

## 2021-02-14 MED ORDER — DULOXETINE HCL 40 MG PO CPEP: 40.0000 mg | ORAL_CAPSULE | Freq: Every day | ORAL | 3 refills | Status: DC

## 2021-02-14 MED ORDER — TAMSULOSIN HCL 0.4 MG PO CAPS: 0.4000 mg | ORAL_CAPSULE | Freq: Two times a day (BID) | ORAL | 0 refills | Status: DC

## 2021-02-14 MED ORDER — TOPIRAMATE 50 MG PO TABS: 50.0000 mg | ORAL_TABLET | Freq: Two times a day (BID) | ORAL | 3 refills | Status: DC

## 2021-02-14 MED ORDER — FUROSEMIDE 20 MG PO TABS: 40.0000 mg | ORAL_TABLET | Freq: Every day | ORAL | 0 refills | Status: DC

## 2021-02-14 MED ORDER — PREGABALIN 25 MG PO CAPS: ORAL_CAPSULE | ORAL | 1 refills | Status: DC

## 2021-02-14 MED ORDER — LORATADINE 10 MG PO TABS
10.0000 mg | ORAL_TABLET | Freq: Every day | ORAL | Status: DC
Start: 2021-02-14 — End: 2021-02-15
  Administered 2021-02-14 – 2021-02-15 (×2): 10 mg via ORAL
  Filled 2021-02-14 (×2): qty 1

## 2021-02-14 MED ORDER — FINASTERIDE 5 MG PO TABS: 5.0000 mg | ORAL_TABLET | Freq: Every day | ORAL | 0 refills | Status: DC

## 2021-02-14 MED ORDER — APIXABAN 5 MG PO TABS: 5.0000 mg | ORAL_TABLET | Freq: Two times a day (BID) | ORAL | 0 refills | Status: DC

## 2021-02-14 MED ORDER — PRIMIDONE 250 MG PO TABS: 250.0000 mg | ORAL_TABLET | Freq: Two times a day (BID) | ORAL | 11 refills | Status: DC

## 2021-02-14 MED ORDER — RESOURCE THICKENUP CLEAR PO POWD: 1.0000 | ORAL | 0 refills | Status: DC | PRN

## 2021-02-14 NOTE — Progress Notes (Signed)
Physical Therapy Session Note  Patient Details  Name: Douglas Walker MRN: [id number] Date of Birth: 03-02-1925  Today's Date: 02/14/2021 PT Individual Time: 9476-5465; 1405-1500 PT Individual Time Calculation (min): 60 min & 55 min  Short Term Goals: Week 2:  PT Short Term Goal 1 (Week 2): pt to demonstrate supervision for transfers with LRAD PT Short Term Goal 2 (Week 2): pt to demonstrate supervision for gait with LRAD 300'  Skilled Therapeutic Interventions/Progress Updates:    Session1:  Patient seated EOB and initially not ready to walk again after finishing with OT.  Discussion about fall risk reduction discussing issues about vision, lighting, footwear, using walker and having supervision.  Patient needing lots of extra time to verbalize with difficulty with word finding and determined to get the word out.  Patient sit to stand S and ambulated x 170' with S with RW and min cues for proximity to walker and hip extension. Patient negotiated 4" curb step with RW with CGA.  Ambulated over compliant surface with RW to simulate grassy surface with bean bags under with CGA.  Patient standing without UE support to toss bean bags for cornhole with close S to CGA.  Patient ambulated back to room with S with RW.  Sit <> supine I without rails or HOB elevation.  Patient toileted with S for clothing management and A for hygiene after BM.  Patient left in supine with bed alarm on and needs in reach.  Session2:Patient in supine and resting, but easily roused.  Son in the room and reports doesn't feel he needs to go through therapies today.  Did review fall prevention tips with son as reviewed with pt in earlier session.  Patient requesting to toilet and toileted with distant supervision.  Washed hands at sink with S and intermittent UE support.  Ambulated to ortho gym 160'  and while seated performed strength/coordination/sensation assessment.  Performed car transfer to simulated sedan height with CGA.   Pt preference to step in with L leg first prior to sitting despite cues from PT to sit first.  Patient able to get legs out on his own, but demonstrated with frustration that he cannot get legs in if sits on edge of car seat first.  Patient ambulated to general gym and negotiated 12 steps with rails and S.  Standing at counter for side stepping, forward march and attempted hip extension, but reports too much pain in L knee.  Patient ambulated to room 200' with RW and S.  Requesting to toilet so assisted to bathroom and pt toileted with distant S, but requesting help for hygiene after BM.  Patient sit to supine Independent and left with needs in reach and bed alarm active.   Therapy Documentation Precautions:  Precautions Precautions: Fall Precaution Comments: Aphasic, HOH, legally blind in L eye and decreased vision in R eye per son Restrictions Weight Bearing Restrictions: No Pain: Pain Assessment Pain Score: 4  Faces Pain Scale: Hurts little more Pain Type: Chronic pain Pain Location: Knee Pain Orientation: Right;Left Pain Descriptors / Indicators: Aching Pain Onset: With Activity Pain Intervention(s): Rest   Therapy/Group: Individual Therapy  Reginia Naas  [place], [place] 02/14/2021, 12:24 PM

## 2021-02-14 NOTE — Progress Notes (Signed)
[facility] PHYSICAL MEDICINE & REHABILITATION PROGRESS NOTE   Subjective/Complaints:  Douglas Walker has 16 oz of nectar thick fluids on tray- drinking well.  Nose itching- driving him nuts- spray helps, but doesn't resolve pain.   Rash itches less.    [id number] by cognition/aphasia   Objective:   No results found. Recent Labs    02/12/21 0557  WBC 5.2  HGB 11.1*  HCT 34.7*  PLT 131*   Recent Labs    02/12/21 0557  NA 139  K 4.0  CL 105  CO2 26  GLUCOSE 96  BUN 25*  CREATININE 1.24  CALCIUM 8.9    Intake/Output Summary (Last 24 hours) at 02/14/2021 0981 Last data filed at 02/14/2021 0401 Gross per 24 hour  Intake 1380 ml  Output -  Net 1380 ml        Physical Exam: Vital Signs Blood pressure (!) 141/66, pulse 70, temperature 98 F (36.7 C), resp. rate 16, height 5\' 9"  (1.753 m), weight 93.6 kg, SpO2 97 %. Constitutional: sitting EOB, appropriate, eating well, NT in room, NAD HEENT: EOMI, oral mucosa a little dry, still, but constantly scratching nose due to itching Neck: supple Cardiovascular: RRR- no JVD Respiratory/Chest: .CTA B/L- no W/R/R- good air movement GI/Abdomen: Soft, NT, ND, (+)BS   Ext: no clubbing, cyanosis, or edema Psych: frustrated due to aphasia, not finding words still- esp  today Skin: Warm and dry.  Rash on back less red, covers less area- scratchings less.  Scattered ecchymosis  Musc: No edema in extremities.  No tenderness in extremities. Neuro: HOH- Alert Motor: LUE/LLE: 4+/5 RUE/RLE: Appears to be 4/5 proximal distal  Assessment/Plan: 1. Functional deficits which require 3+ hours per day of interdisciplinary therapy in a comprehensive inpatient rehab setting.  Physiatrist is providing close team supervision and 24 hour management of active medical problems listed below.  Physiatrist and rehab team continue to assess barriers to discharge/monitor patient progress toward functional and medical goals  Care Tool:  Bathing     Body parts bathed by patient: Right arm,Left arm,Chest,Abdomen,Front perineal area,Buttocks,Right upper leg,Left upper leg,Face,Right lower leg,Left lower leg   Body parts bathed by helper: Right lower leg,Left lower leg (due to tight space in shower, was able to wash feet sitting at the sink)     Bathing assist Assist Level: Supervision/Verbal cueing     Upper Body Dressing/Undressing Upper body dressing   What is the patient wearing?: Pull over shirt    Upper body assist Assist Level: Supervision/Verbal cueing    Lower Body Dressing/Undressing Lower body dressing      What is the patient wearing?: Underwear/pull up,Pants     Lower body assist Assist for lower body dressing: Supervision/Verbal cueing     Toileting Toileting Toileting Activity did not occur (Clothing management and hygiene only): N/A (no void or bm)  Toileting assist Assist for toileting: Supervision/Verbal cueing (urine only)     Transfers Chair/bed transfer  Transfers assist     Chair/bed transfer assist level: Supervision/Verbal cueing     Locomotion Ambulation   Ambulation assist      Assist level: Supervision/Verbal cueing Assistive device: Walker-rolling Max distance: 300   Walk 10 feet activity   Assist     Assist level: Supervision/Verbal cueing Assistive device: Walker-rolling   Walk 50 feet activity   Assist    Assist level: Supervision/Verbal cueing Assistive device: Walker-rolling    Walk 150 feet activity   Assist    Assist level: Supervision/Verbal cueing Assistive device: Walker-rolling  Walk 10 feet on uneven surface  activity   Assist     Assist level: Contact Guard/Touching assist Assistive device: Walker-rolling   Wheelchair     Assist Will patient use wheelchair at discharge?: No             Wheelchair 50 feet with 2 turns activity    Assist            Wheelchair 150 feet activity     Assist           Blood pressure (!) 141/66, pulse 70, temperature 98 F (36.7 C), resp. rate 16, height 5\' 9"  (1.753 m), weight 93.6 kg, SpO2 97 %.    Medical Problem List and Plan: 1.  Right hemiparesis and aphasia secondary to left cortical infarct/embolic left MCA  Continue CIR 2.  Antithrombotics: -DVT/anticoagulation: Eliquis             -antiplatelet therapy: N/A 3. Pain Management: Topamax 50 mg twice daily, Lyrica 25 mg nightly, Cymbalta 20 mg daily- c/o sides hurting- will add tramadol prn for severe pain  2/7- on pain regimen- doing well- con't regimen  2/8- wants tylenol- "hurts all over"- will let nursing know- con't regimen-  2/16- nursing has used tramadol sometimes- con't tylenol/tramadol prn 4. Mood: Provide emotional support  2/10- will increase Duloxetine to 40 mg daily- - due to tearfulness- concerned about mood  2/11- denies side effects- con't regimen             -antipsychotic agents: N/A 5. Neuropsych: This patient is capable of making decisions on his own behalf. 6. Skin/Wound Care: Routine skin checks 7. Fluids/Electrolytes/Nutrition: Routine in and outs. 8.  PAF/pacemaker.  Cardiac rate controlled.  Continue Eliquis 9.  CKD stage IIIB    Creatinine 1.37 on 2/4, labs ordered for Monday  2/7- will order labs for Tuesday 2/8- since don't have today  2/8- Cr 1.01 and BUN 31- slightly dry- but on nectar thick liquids- will recheck Thursday and if rising more, will give IVFs.  2/9- actually started on IVFs 2/8- Cr up slightly- BUN down slightly to 27- will write for IVFs at night for now.   2/11- d/w nursing to push PO fluids more- esp since on nectar thick liquids - BUN stable at 26; but Cr slightly up at 1.33 from 1.29 (up from 1.21)- Douglas Walker doesn't want IVFs- will recheck Saturday, and Monday to see if worsening- if gets worse, will NEED IVFs, regardless of Douglas Walker desire.   2/12 BUN/Cr stable to improved 24/1.27   -continue to encourage fluids   -recheck Monday   2/14- Cr down to 1.24  and BUN stable at 25- push nectar thick liquids- d/w NT  2/15- pushing Douglas Walker to drink at least 12 oz, if not more with each meal and in between- needs more than normal.   2/16- drinking well but will recheck labs one more time before d/c to make sure BUN/Cr better 10.  Diastolic congestive heart failure.  Patient on Lasix 60 mg daily prior to admission and has been on hold since admission.  Resume as needed monitor for any signs of fluid overload Filed Weights   02/12/21 0500 02/13/21 0500 02/14/21 0354  Weight: 96.3 kg 93 kg 93.6 kg   Daily weights ordered  2/10-12- weight stable - con't regimen 11.  Essential tremor.  Mysoline 250 mg twice daily- mainly RUE and jaw  Persistent on 2/5  2/8- tremors slightly better this AM-c on't regimen  2/15- still has  severe jaw tremors- worse when gets upset- con't regimen 12.  BPH.  Flomax 0.4 mg twice daily, Proscar 5 mg daily 13.  Legally blind.  Patient independent ADLs prior to admission 14.  Hypothyroidism: Synthroid 15.  History of ESBL 07/2020.    2/10- taken off contact precautions per Infection control  16. Apnea episodes  2/4- dropped sats to 84% during apnea episode yesterday- still having apnea- will try and get CPAP for Douglas Walker- hopefully would wear.   2/8- Douglas Walker refusing- says doesn't need- will try and get to wear  2/9- still refusing  2/10- will d/c CPAP- Douglas Walker refuses to use, in spite of description of apnea episodes  2/11- d/c'd CPAP order 17.  Post stroke dysphagia  D1 nectars, advance diet as tolerated  2/7- is doing well with intake including nectar thick liquids- con't regimen  2/8- BUN 31- per staff drinking well, but need to push more fluids PO  2/9- will order night time IVFs since not drinking nectar thick liquids well  2/12 see #9  2/14- refusing night time IVFs- con't regimen 18. Constipation  2/7- LBM Friday 2/4- will try Miralax today and if not effective, will give Sorbitol.   2/8- LBM 2/6 small- will give Sorbitol today.    2/9- LBM last night after sorbitol  2/12 needs another bmt this weekend   -repeat sorbitol   -schedule senna-s 19. Nasal congestion/itching  2/14- added Flonase 2 sprays each nare daily  2/15- a little better this AM  2/16- will add Claritin for itching- 10 mg daily.   LOS: 13 days A FACE TO FACE EVALUATION WAS PERFORMED  Henson Fraticelli 02/14/2021, 8:08 AM

## 2021-02-14 NOTE — Progress Notes (Signed)
Modified Barium Swallow Progress Note  Patient Details  Name: Douglas Walker MRN: [id number] Date of Birth: 04-06-1925  Today's Date: 02/14/2021  Modified Barium Swallow completed.  Full report located under Chart Review in the Imaging Section.  Brief recommendations include the following:  Clinical Impression  Patient presents with slightly improved swallow function as compared to most recent MBS 6 days ago. He did not exhibit any aspiration with nectar thick liquids, but continues with swallow initiation delay to level of vallecular sinus. Patient continues with silent aspiration of trace-min amount with thin liquids via cup and spoon and without any benefit from chin tuck posture. Aspiration events with thin liquids occured more frequently and slightly increased amount (previously trace and today trace to min). He exhibited mild delays of mastication of regular solids but no significant amount of oral or pharyngeal residuals with puree solids, regular solids, thin liquids or nectar thick liquids.   Swallow Evaluation Recommendations       SLP Diet Recommendations: Dysphagia 3 (Mech soft) solids;Nectar thick liquid   Liquid Administration via: Cup;No straw   Medication Administration: Whole meds with puree   Supervision: Patient able to self feed;Intermittent supervision to cue for compensatory strategies   Compensations: Minimize environmental distractions;Slow rate;Small sips/bites   Postural Changes: Seated upright at 90 degrees   Oral Care Recommendations: Oral care BID       Sonia Baller, MA, CCC-SLP Speech Therapy

## 2021-02-14 NOTE — Progress Notes (Signed)
Occupational Therapy Discharge Summary  Patient Details  Name: Douglas Walker MRN: [id number] Date of Birth: [birth year]  Today's Date: 02/14/2021 OT Individual Time: 4097-3532 OT Individual Time Calculation (min): 54 min    Patient has met 9 of 10 long term goals due to improved activity tolerance, improved balance, postural control, ability to compensate for deficits, functional use of  RIGHT upper extremity, improved attention, improved awareness and improved coordination.  Patient to discharge at overall Supervision level.  Patient's care partner is independent to provide the necessary physical and cognitive assistance at discharge.  Pt is overall Supervision with BADLs including showering at shower level, dressing for UB/LB including underwear and pants with compensatory techniques, and toilet transfers and toileting tasks. Pt will occasionally need assist for hygiene post BM in standing but can perform sitting. Pt does have expressive aphasia that has been limiting, but pt is able to generally communicate his needs with extended time. Family ed completed with son [name].   Reasons goals not met: pt did not meet self feeding goal, needing Min-Mod at last assessment d/t visual deficits and tremors.   Recommendation:  Patient will benefit from ongoing skilled OT services in home health setting to continue to advance functional skills in the area of BADL and Reduce care partner burden.  Equipment: No equipment provided Pt has 3-in1 BSC and shower seat  Reasons for discharge: treatment goals met and discharge from hospital  Patient/family agrees with progress made and goals achieved: Yes   Skilled Intervention: [name] at time of session supine in bed sleeping, agreeable to OT session and no pain. Declined shower level bathing and toileting, since he had a shower yesterday and said he just went to the bathroom with nursing staff. Provided nectar thick cranberry juice at beginning of  session per pt request and provided full supervision cues for chin tuck. Donned new shirt with set up and deodorant in same manner. Walked room > gym Supervision with RW. Nustep for 12 mins on level 3 for BUE/BLE strengthening and cardio endurance. Pt encouraged to take rest break but wanted to keep going for full 12 mins with no c/o knee pain or discomfort. Walked gym > room Supervision with RW doing well maneuvering obstacles and tight turns. Walked > bathroom Supervision and performed 3/3 toileting tasks in same manner. When walking to sink for hand hygiene, PT entered for session and hand off to PT.   OT Discharge Precautions/Restrictions  Precautions Precautions: Fall Precaution Comments: Aphasic, HOH, legally blind in L eye and decreased vision in R eye per son Restrictions Weight Bearing Restrictions: No Pain Pain Assessment Pain Scale: 0-10 Pain Score: 0-No pain Faces Pain Scale: Hurts little more Pain Type: Chronic pain Pain Location: Knee Pain Orientation: Right;Left Pain Descriptors / Indicators: Aching Pain Onset: With Activity Pain Intervention(s): Rest ADL ADL Eating: Not assessed Grooming: Supervision/safety Where Assessed-Grooming: Standing at sink Upper Body Bathing: Setup Where Assessed-Upper Body Bathing: Shower Lower Body Bathing: Supervision/safety Where Assessed-Lower Body Bathing: Shower Upper Body Dressing: Supervision/safety Where Assessed-Upper Body Dressing: Standing at sink Lower Body Dressing: Supervision/safety Where Assessed-Lower Body Dressing: Standing at sink,Sitting at sink Toileting: Supervision/safety Where Assessed-Toileting: Glass blower/designer: Close supervision Armed forces technical officer Method: Counselling psychologist: Radiographer, therapeutic: Not assessed Social research officer, government: Close supervision Vision Baseline Vision/History: Wears glasses Wears Glasses: At all times Perception  Perception: Within Functional  Limits Praxis Praxis: Intact Cognition Overall Cognitive Status: Within Functional Limits for tasks assessed Arousal/Alertness: Awake/alert Orientation Level: Oriented  to person;Oriented to place Focused Attention: Impaired Sustained Attention: Impaired Selective Attention: Appears intact Awareness: Impaired Awareness Impairment: Emergent impairment Problem Solving: Impaired Sequencing: Appears intact Safety/Judgment: Impaired Sensation Sensation Light Touch: Appears Intact Coordination Gross Motor Movements are Fluid and Coordinated: No Fine Motor Movements are Fluid and Coordinated: No Coordination and Movement Description: Tremulous, most prominant in the Rt UE and face but still able to complete ADL tasks w/ S Motor  Motor Motor: Within Functional Limits Motor - Skilled Clinical Observations: does have baseline essential tremor in R UE and at mouth Mobility  Bed Mobility Supine to Sit: Independent with assistive device Sitting - Scoot to Edge of Bed: Independent with assistive device Sit to Supine: Independent with assistive device Transfers Sit to Stand: Supervision/Verbal cueing  Trunk/Postural Assessment  Cervical Assessment Cervical Assessment: Exceptions to [facility] Thoracic Assessment Thoracic Assessment: Exceptions to [facility] Lumbar Assessment Lumbar Assessment: Exceptions to [facility] Postural Control Postural Control: Deficits on evaluation  Balance Balance Balance Assessed: Yes Static Sitting Balance Static Sitting - Balance Support: Feet supported Static Sitting - Level of Assistance: 6: Modified independent (Device/Increase time) Dynamic Sitting Balance Dynamic Sitting - Balance Support: Feet supported Dynamic Sitting - Level of Assistance: 6: Modified independent (Device/Increase time) Dynamic Sitting - Balance Activities: Reaching across midline;Forward lean/weight shifting;Lateral lean/weight shifting;Reaching for objects Static Standing Balance Static  Standing - Balance Support: During functional activity Static Standing - Level of Assistance: 5: Stand by assistance Dynamic Standing Balance Dynamic Standing - Balance Support: During functional activity;Left upper extremity supported Dynamic Standing - Level of Assistance: 5: Stand by assistance Dynamic Standing - Balance Activities: Lateral lean/weight shifting;Forward lean/weight shifting;Reaching for objects Extremity/Trunk Assessment RUE Assessment RUE Assessment: Exceptions to [facility] Active Range of Motion (AROM) Comments: shoulder mobility limited but able to complete BADLs, tremulous LUE Assessment LUE Assessment: Within Functional Limits   [name] 02/14/2021, 12:39 PM

## 2021-02-15 NOTE — Progress Notes (Signed)
[place] PHYSICAL MEDICINE & REHABILITATION PROGRESS NOTE   Subjective/Complaints:  Pt reports son coming to get him- didn't get 2 additional nectar thick liquids like usual this AM- will ask to give more fluids.  Advised to drink as much as he can- at least 32 oz/day at home.    ROS: limited by aphasia/cognition   Objective:   DG Swallowing Func-Speech Pathology  Result Date: 02/14/2021 Objective Swallowing Evaluation: Type of Study: MBS-Modified Barium Swallow Study  Patient Details Name: Douglas Walker MRN: [id number] Date of Birth: 1925-12-14 Today's Date: 02/14/2021 Time: SLP Start Time (ACUTE ONLY): 0630 -SLP Stop Time (ACUTE ONLY): 0942 SLP Time Calculation (min) (ACUTE ONLY): 17 min Past Medical History: Past Medical History: Diagnosis Date . Arthritis   shoulders and back . Cancer ([place])   skin cancers . Chronic low back pain 12/15/2017 . CKD (chronic kidney disease) stage 3, GFR 30-59 ml/min (HCC)  . Coronary artery disease  . Dysrhythmia    Paroxysmal atrial fibrillation; Tachycardia-bradycardia syndrome . Essential tremor 02/28/2016 . GERD (gastroesophageal reflux disease)  . Hernia  . History of hiatal hernia  . Hyperlipemia  . Hypertension  . Macular degeneration  . Neuromuscular disorder (HCC)   neuropathy . Paroxysmal atrial fibrillation ([place]) 10/28/2013 . Presence of permanent cardiac pacemaker 12/04/2018 . Tachycardia-bradycardia syndrome ([place]) 09/13/2014 . TIA (transient ischemic attack)  Past Surgical History: Past Surgical History: Procedure Laterality Date . APPENDECTOMY   . EYE SURGERY   . HERNIA REPAIR   . HERNIA REPAIR   . INSERT / REPLACE / REMOVE PACEMAKER  12/04/2018 . IRRIGATION AND DEBRIDEMENT ABSCESS Right 07/24/2020  Procedure: IRRIGATION AND DEBRIDEMENT HEMATOMA;  Surgeon: Coralie Keens, MD;  Location: [facility];  Service: General;  Laterality: Right; . MASS EXCISION Right 07/20/2020  Procedure: EXCISION OF RIGHT CHEST WALL MASS;  Surgeon: Stark Klein, MD;   Location: [facility];  Service: General;  Laterality: Right; . PACEMAKER IMPLANT N/A 12/04/2018  Procedure: PACEMAKER IMPLANT;  Surgeon: Evans Lance, MD;  Location: [facility];  Service: Cardiovascular;  Laterality: N/A; HPI: Patient is a 87 y.o. mqale with PMH: paroxysmal afib, not on anticoagulants due to frequent falls, peripheral neuropathy, essential tremor, HTN, CKD stage IIIB, who presented to hospital from home (lives with son) with AMS. Son had reported sudden onset confusion, unable to talk , some drooling and wobbly when walking. CT head did not find anything significant, CTA showed occluded left M3/M4 but no intervention due to size of branch.  Subjective: upright in chair at bedside, aphasia noted, very HOH Assessment / Plan / Recommendation CHL IP CLINICAL IMPRESSIONS 02/14/2021 Clinical Impression Patient presents with slightly improved swallow function as compared to most recent MBS 6 days ago. He did not exhibit any aspiration with nectar thick liquids, but continues with swallow initiation delay to level of vallecular sinus. Patient continues with silent aspiration of trace-min amount with thin liquids via cup and spoon and without any benefit from chin tuck posture. Aspiration events with thin liquids occured more frequently and slightly increased amount (previously trace and today trace to min). He exhibited mild delays of mastication of regular solids but no significant amount of oral or pharyngeal residuals with puree solids, regular solids, thin liquids or nectar thick liquids. SLP Visit Diagnosis Dysphagia, oropharyngeal phase (R13.12) Attention and concentration deficit following -- Frontal lobe and executive function deficit following -- Impact on safety and function Moderate aspiration risk;Mild aspiration risk   CHL IP TREATMENT RECOMMENDATION 02/14/2021 Treatment Recommendations Therapy as  outlined in treatment plan below   Prognosis 02/08/2021 Prognosis for Safe Diet Advancement Fair  Barriers to Reach Goals -- Barriers/Prognosis Comment -- CHL IP DIET RECOMMENDATION 02/14/2021 SLP Diet Recommendations Dysphagia 3 (Mech soft) solids;Nectar thick liquid Liquid Administration via Cup;No straw Medication Administration Whole meds with puree Compensations Minimize environmental distractions;Slow rate;Small sips/bites Postural Changes Seated upright at 90 degrees   CHL IP OTHER RECOMMENDATIONS 02/14/2021 Recommended Consults -- Oral Care Recommendations Oral care BID Other Recommendations --   CHL IP FOLLOW UP RECOMMENDATIONS 02/14/2021 Follow up Recommendations 24 hour supervision/assistance;Home health SLP   CHL IP FREQUENCY AND DURATION 01/30/2021 Speech Therapy Frequency (ACUTE ONLY) min 2x/week Treatment Duration --      CHL IP ORAL PHASE 02/14/2021 Oral Phase Impaired Oral - Pudding Teaspoon -- Oral - Pudding Cup -- Oral - Honey Teaspoon -- Oral - Honey Cup -- Oral - Nectar Teaspoon NT Oral - Nectar Cup Reduced posterior propulsion Oral - Nectar Straw -- Oral - Thin Teaspoon Premature spillage Oral - Thin Cup Premature spillage Oral - Thin Straw -- Oral - Puree Reduced posterior propulsion Oral - Mech Soft -- Oral - Regular Reduced posterior propulsion;Impaired mastication Oral - Multi-Consistency -- Oral - Pill NT Oral Phase - Comment --  CHL IP PHARYNGEAL PHASE 02/14/2021 Pharyngeal Phase Impaired Pharyngeal- Pudding Teaspoon -- Pharyngeal -- Pharyngeal- Pudding Cup -- Pharyngeal -- Pharyngeal- Honey Teaspoon NT Pharyngeal -- Pharyngeal- Honey Cup NT Pharyngeal -- Pharyngeal- Nectar Teaspoon NT Pharyngeal -- Pharyngeal- Nectar Cup Delayed swallow initiation-vallecula;Pharyngeal residue - valleculae;Pharyngeal residue - posterior pharnyx Pharyngeal Material does not enter airway Pharyngeal- Nectar Straw -- Pharyngeal -- Pharyngeal- Thin Teaspoon Delayed swallow initiation-pyriform sinuses;Penetration/Aspiration before swallow;Penetration/Aspiration during swallow Pharyngeal Material enters airway,  passes BELOW cords without attempt by patient to eject out (silent aspiration) Pharyngeal- Thin Cup Delayed swallow initiation-pyriform sinuses;Reduced airway/laryngeal closure;Penetration/Aspiration during swallow;Penetration/Aspiration before swallow Pharyngeal Material enters airway, passes BELOW cords without attempt by patient to eject out (silent aspiration) Pharyngeal- Thin Straw -- Pharyngeal -- Pharyngeal- Puree Delayed swallow initiation-vallecula Pharyngeal -- Pharyngeal- Mechanical Soft -- Pharyngeal -- Pharyngeal- Regular Delayed swallow initiation-vallecula Pharyngeal -- Pharyngeal- Multi-consistency -- Pharyngeal -- Pharyngeal- Pill NT Pharyngeal -- Pharyngeal Comment --  CHL IP CERVICAL ESOPHAGEAL PHASE 02/14/2021 Cervical Esophageal Phase WFL Pudding Teaspoon -- Pudding Cup -- Honey Teaspoon -- Honey Cup -- Nectar Teaspoon -- Nectar Cup -- Nectar Straw -- Thin Teaspoon -- Thin Cup -- Thin Straw -- Puree -- Mechanical Soft -- Regular -- Multi-consistency -- Pill -- Cervical Esophageal Comment -- Sonia Baller, MA, CCC-SLP Speech Therapy             No results for input(s): WBC, HGB, HCT, PLT in the last 72 hours. Recent Labs    02/14/21 0833  NA 140  K 3.7  CL 105  CO2 27  GLUCOSE 110*  BUN 22  CREATININE 1.31*  CALCIUM 8.9    Intake/Output Summary (Last 24 hours) at 02/15/2021 0916 Last data filed at 02/15/2021 0809 Gross per 24 hour  Intake 1280 ml  Output --  Net 1280 ml        Physical Exam: Vital Signs Blood pressure (!) 146/69, pulse 70, temperature 98.4 F (36.9 C), temperature source Oral, resp. rate 16, height 5\' 9"  (1.753 m), weight 93.7 kg, SpO2 98 %. Constitutional: sitting up in bed; appropriate, NAD HEENT: EOMI, oral mucosa a little dry Neck: supple Cardiovascular: RRR_ no JVD Respiratory/Chest: .CTA B/L- no W/R/R- good air movement GI/Abdomen: Soft, NT, ND, (+)BS  Ext: no clubbing, cyanosis, or edema Psych: frustrated due to aphasia, not finding  words still- no change Skin: Warm and dry.  Rash on back less red, covers less area- scratching less. Looks stable to better today  Scattered ecchymosis- no change in UEs  Musc: No edema in extremities.  No tenderness in extremities. Neuro: HOH- Alert Motor: LUE/LLE: 4+/5 RUE/RLE: Appears to be 4/5 proximal distal  Assessment/Plan: 1. Functional deficits which require 3+ hours per day of interdisciplinary therapy in a comprehensive inpatient rehab setting.  Physiatrist is providing close team supervision and 24 hour management of active medical problems listed below.  Physiatrist and rehab team continue to assess barriers to discharge/monitor patient progress toward functional and medical goals  Care Tool:  Bathing    Body parts bathed by patient: Right arm,Left arm,Chest,Abdomen,Front perineal area,Buttocks,Right upper leg,Left upper leg,Face,Right lower leg,Left lower leg   Body parts bathed by helper: Right lower leg,Left lower leg (due to tight space in shower, was able to wash feet sitting at the sink)     Bathing assist Assist Level: Supervision/Verbal cueing     Upper Body Dressing/Undressing Upper body dressing   What is the patient wearing?: Pull over shirt    Upper body assist Assist Level: Supervision/Verbal cueing    Lower Body Dressing/Undressing Lower body dressing      What is the patient wearing?: Underwear/pull up,Pants     Lower body assist Assist for lower body dressing: Supervision/Verbal cueing     Toileting Toileting Toileting Activity did not occur (Clothing management and hygiene only): N/A (no void or bm)  Toileting assist Assist for toileting: Minimal Assistance - Patient > 75%     Transfers Chair/bed transfer  Transfers assist     Chair/bed transfer assist level: Supervision/Verbal cueing     Locomotion Ambulation   Ambulation assist      Assist level: Supervision/Verbal cueing Assistive device: Walker-rolling Max  distance: 170   Walk 10 feet activity   Assist     Assist level: Supervision/Verbal cueing Assistive device: Walker-rolling   Walk 50 feet activity   Assist    Assist level: Supervision/Verbal cueing Assistive device: Walker-rolling    Walk 150 feet activity   Assist    Assist level: Supervision/Verbal cueing Assistive device: Walker-rolling    Walk 10 feet on uneven surface  activity   Assist     Assist level: Contact Guard/Touching assist Assistive device: Aeronautical engineer Will patient use wheelchair at discharge?: No             Wheelchair 50 feet with 2 turns activity    Assist            Wheelchair 150 feet activity     Assist          Blood pressure (!) 146/69, pulse 70, temperature 98.4 F (36.9 C), temperature source Oral, resp. rate 16, height 5\' 9"  (1.753 m), weight 93.7 kg, SpO2 98 %.    Medical Problem List and Plan: 1.  Right hemiparesis and aphasia secondary to left cortical infarct/embolic left MCA  Continue CIR 2.  Antithrombotics: -DVT/anticoagulation: Eliquis             -antiplatelet therapy: N/A 3. Pain Management: Topamax 50 mg twice daily, Lyrica 25 mg nightly, Cymbalta 20 mg daily- c/o sides hurting- will add tramadol prn for severe pain  2/7- on pain regimen- doing well- con't regimen  2/8- wants tylenol- "hurts all over"- will let nursing  know- con't regimen-  2/16- nursing has used tramadol sometimes- con't tylenol/tramadol prn  2/17- send home on tramadol prn 4. Mood: Provide emotional support  2/10- will increase Duloxetine to 40 mg daily- - due to tearfulness- concerned about mood  2/11- denies side effects- con't regimen             -antipsychotic agents: N/A 5. Neuropsych: This patient is capable of making decisions on his own behalf. 6. Skin/Wound Care: Routine skin checks 7. Fluids/Electrolytes/Nutrition: Routine in and outs. 8.  PAF/pacemaker.  Cardiac rate  controlled.  Continue Eliquis 9.  CKD stage IIIB    Creatinine 1.37 on 2/4, labs ordered for Monday  2/7- will order labs for Tuesday 2/8- since don't have today  2/8- Cr 1.01 and BUN 31- slightly dry- but on nectar thick liquids- will recheck Thursday and if rising more, will give IVFs.  2/9- actually started on IVFs 2/8- Cr up slightly- BUN down slightly to 27- will write for IVFs at night for now.   2/11- d/w nursing to push PO fluids more- esp since on nectar thick liquids - BUN stable at 26; but Cr slightly up at 1.33 from 1.29 (up from 1.21)- pt doesn't want IVFs- will recheck Saturday, and Monday to see if worsening- if gets worse, will NEED IVFs, regardless of pt desire.   2/12 BUN/Cr stable to improved 24/1.27   -continue to encourage fluids   -recheck Monday   2/14- Cr down to 1.24 and BUN stable at 25- push nectar thick liquids- d/w NT  2/15- pushing pt to drink at least 12 oz, if not more with each meal and in between- needs more than normal.   2/16- drinking well but will recheck labs one more time before d/c to make sure BUN/Cr better  2/17- Cr 1.31- but BUN better at 22-advised pt to increase lfuid intake, esp at home- at least 32 oz/day, and can drink more if need be 10.  Diastolic congestive heart failure.  Patient on Lasix 60 mg daily prior to admission and has been on hold since admission.  Resume as needed monitor for any signs of fluid overload Filed Weights   02/13/21 0500 02/14/21 0354 02/15/21 0546  Weight: 93 kg 93.6 kg 93.7 kg   Daily weights ordered  2/17- Weight stable- con't regimen 11.  Essential tremor.  Mysoline 250 mg twice daily- mainly RUE and jaw  Persistent on 2/5  2/8- tremors slightly better this AM-c on't regimen  2/15- still has severe jaw tremors- worse when gets upset- con't regimen 12.  BPH.  Flomax 0.4 mg twice daily, Proscar 5 mg daily 13.  Legally blind.  Patient independent ADLs prior to admission 14.  Hypothyroidism: Synthroid 15.  History of  ESBL 07/2020.    2/10- taken off contact precautions per Infection control  16. Apnea episodes  2/4- dropped sats to 84% during apnea episode yesterday- still having apnea- will try and get CPAP for pt- hopefully would wear.   2/8- pt refusing- says doesn't need- will try and get to wear  2/9- still refusing  2/10- will d/c CPAP- pt refuses to use, in spite of description of apnea episodes  2/11- d/c'd CPAP order 17.  Post stroke dysphagia  D1 nectars, advance diet as tolerated  2/7- is doing well with intake including nectar thick liquids- con't regimen  2/8- BUN 31- per staff drinking well, but need to push more fluids PO  2/9- will order night time IVFs since not drinking  nectar thick liquids well  2/12 see #9  2/14- refusing night time IVFs- con't regimen  2/17- has upgraded diet to D3 with nectar thick liquids-  18. Constipation  2/7- LBM Friday 2/4- will try Miralax today and if not effective, will give Sorbitol.   2/8- LBM 2/6 small- will give Sorbitol today.   2/9- LBM last night after sorbitol  2/12 needs another bmt this weekend   -repeat sorbitol   -schedule senna-s 19. Nasal congestion/itching  2/14- added Flonase 2 sprays each nare daily  2/15- a little better this AM  2/16- will add Claritin for itching- 10 mg daily.   2/17- somewhat helpful.   LOS: 14 days A FACE TO FACE EVALUATION WAS PERFORMED  Alize Borrayo 02/15/2021, 9:16 AM

## 2021-02-15 NOTE — Progress Notes (Signed)
Inpatient Rehabilitation Care Coordinator Discharge Note  The overall goal for the admission was met for:   Discharge location: Yes, home  Length of Stay: Yes, 14 Days  Discharge activity level: Yes, ambulatory level Supervision  Home/community participation: Yes  Services provided included: MD, RD, PT, OT, SLP, RN, CM, TR, Pharmacy, Neuropsych and SW  Financial Services: Medicare  Choices offered to/list presented BD:[id number] son   Follow-up services arranged: [facility] Health: [facility]  Comments (or additional information): PT OT [facility] NO DME   Patient/Family verbalized understanding of follow-up arrangements: Yes  Individual responsible for coordination of the follow-up plan: [name] [phone]  Confirmed correct DME delivered: [name] 02/15/2021    [name]

## 2021-02-15 NOTE — Progress Notes (Signed)
Speech Language Pathology Discharge Summary  Patient Details  Name: Douglas Walker MRN: [id number] Date of Birth: 01-19-1925  Today's Date: 02/15/2021   Patient has met 5 of 5 long term goals.  Patient to discharge at overall Supervision level.  Reasons goals not met: N/A   Clinical Impression/Discharge Summary: Patient made good progress overall and met all 5 LTG"s in areas of cognition, language and swallow function. Patient continues with decreased initiation, slow to process and respond, however is able to feed self and follow swallow precautions (previously chin tuck but upgraded to not needing chin tuck) with setup assistance and intermittent supervision with meals.Repeat MBS on 2/16 showed improved toleration with nectar thick liquids and solids but continued aspiraiton with thin liquids. Patient also continues with mild-mod expressive aphasia with word finding errors at phrase and sentence level, but with patient demonstrating consistent awareness to errors. SLP completed education with patient, his son and his daughter (daughter via phone call) to discuss management of modified diet at home as well as suggestions for working with his aphasia .  Care Partner:  Caregiver Able to Provide Assistance: Yes;Other (comment) (dysphagia (modified diet management))  Type of Caregiver Assistance: Physical;Cognitive  Recommendation:  Home Health SLP;24 hour supervision/assistance  Rationale for SLP Follow Up: Reduce caregiver burden;Maximize functional communication;Maximize swallowing safety   Equipment: None for ST   Reasons for discharge: Discharged from hospital   Patient/Family Agrees with Progress Made and Goals Achieved: Yes   Sonia Baller, MA, CCC-SLP Speech Therapy

## 2021-02-15 NOTE — Discharge Instructions (Signed)
Inpatient Rehab Discharge Instructions  [facility] Discharge date and time: No discharge date for patient encounter.   Activities/Precautions/ Functional Status: Activity: activity as tolerated Diet: Dysphagia #2 nectar liquids Wound Care: Routine skin checks Functional status:  ___ No restrictions     ___ Walk up steps independently ___ 24/7 supervision/assistance   ___ Walk up steps with assistance ___ Intermittent supervision/assistance  ___ Bathe/dress independently ___ Walk with walker     _x__ Bathe/dress with assistance ___ Walk Independently    ___ Shower independently ___ Walk with assistance    ___ Shower with assistance ___ No alcohol     ___ Return to work/school ________  COMMUNITY REFERRALS UPON DISCHARGE:    Home Health:   PT    OT   ST   SNA                   Agency: [facility] Phone: [phone]    Medical Equipment/Items Ordered:                                                 Agency/Supplier:   Special Instructions: No driving smoking or alcohol  ENCOURAGE NECTAR THICK LIQUIDS  STROKE/TIA DISCHARGE INSTRUCTIONS SMOKING Cigarette smoking nearly doubles your risk of having a stroke & is the single most alterable risk factor  If you smoke or have smoked in the last 12 months, you are advised to quit smoking for your health.  Most of the excess cardiovascular risk related to smoking disappears within a year of stopping.  Ask you doctor about anti-smoking medications   Quit Line: 1-800-QUIT NOW  Free Smoking Cessation Classes [phone]  CHOLESTEROL Know your levels; limit fat & cholesterol in your diet  Lipid Panel     Component Value Date/Time   CHOL 188 01/29/2021 0237   TRIG 96 01/29/2021 0237   HDL 43 01/29/2021 0237   CHOLHDL 4.4 01/29/2021 0237   VLDL 19 01/29/2021 0237   LDLCALC 126 (H) 01/29/2021 0237      Many patients benefit from treatment even if their cholesterol is at goal.  Goal: Total Cholesterol (CHOL)  less than 160  Goal:  Triglycerides (TRIG) less than 150  Goal:  HDL greater than 40  Goal:  LDL (LDLCALC) less than 100   BLOOD PRESSURE American Stroke Association blood pressure target is less that 120/80 mm/Hg  Your discharge blood pressure is:  BP: 130/67  Monitor your blood pressure  Limit your salt and alcohol intake  Many individuals will require more than one medication for high blood pressure  DIABETES (A1c is a blood sugar average for last 3 months) Goal HGBA1c is under 7% (HBGA1c is blood sugar average for last 3 months)  Diabetes: No known diagnosis of diabetes    Lab Results  Component Value Date   HGBA1C 5.3 01/29/2021     Your HGBA1c can be lowered with medications, healthy diet, and exercise.  Check your blood sugar as directed by your physician  Call your physician if you experience unexplained or low blood sugars.  PHYSICAL ACTIVITY/REHABILITATION Goal is 30 minutes at least 4 days per week  Activity: Increase activity slowly, Therapies: Physical Therapy: Home Health Return to work:   Activity decreases your risk of heart attack and stroke and makes your heart stronger.  It helps control your weight and  blood pressure; helps you relax and can improve your mood.  Participate in a regular exercise program.  Talk with your doctor about the best form of exercise for you (dancing, walking, swimming, cycling).  DIET/WEIGHT Goal is to maintain a healthy weight  Your discharge diet is:  Diet Order            DIET - DYS 1 Room service appropriate? Yes with Assist; Fluid consistency: Nectar Thick  Diet effective now                 liquids Your height is:  Height: 5\' 9"  (175.3 cm) Your current weight is: Weight: 90.8 kg Your Body Mass Index (BMI) is:  BMI (Calculated): 29.55  Following the type of diet specifically designed for you will help prevent another stroke.  Your goal weight range is:    Your goal Body Mass Index (BMI) is 19-24.  Healthy food  habits can help reduce 3 risk factors for stroke:  High cholesterol, hypertension, and excess weight.  RESOURCES Stroke/Support Group:  Call [phone]   STROKE EDUCATION PROVIDED/REVIEWED AND GIVEN TO PATIENT Stroke warning signs and symptoms How to activate emergency medical system (call 911). Medications prescribed at discharge. Need for follow-up after discharge. Personal risk factors for stroke. Pneumonia vaccine given:  Flu vaccine given:  My questions have been answered, the writing is legible, and I understand these instructions.  I will adhere to these goals & educational materials that have been provided to me after my discharge from the hospital.      My questions have been answered and I understand these instructions. I will adhere to these goals and the provided educational materials after my discharge from the hospital.  Patient/Caregiver Signature _______________________________ Date __________  Clinician Signature _______________________________________ Date __________  Please bring this form and your medication list with you to all your follow-up doctor's appointments.

## 2021-02-15 NOTE — Progress Notes (Signed)
Pt discharged home with family. D/c instructions given by Linna Hoff, PA. No further questions from pt or family. Belongings sent with pt. Pt stable at time of discharge.  Gerald Stabs, RN

## 2021-02-16 DIAGNOSIS — M19012 Primary osteoarthritis, left shoulder: Secondary | ICD-10-CM | POA: Diagnosis not present

## 2021-02-16 DIAGNOSIS — I2510 Atherosclerotic heart disease of native coronary artery without angina pectoris: Secondary | ICD-10-CM | POA: Diagnosis not present

## 2021-02-16 DIAGNOSIS — M19011 Primary osteoarthritis, right shoulder: Secondary | ICD-10-CM | POA: Diagnosis not present

## 2021-02-16 DIAGNOSIS — M479 Spondylosis, unspecified: Secondary | ICD-10-CM | POA: Diagnosis not present

## 2021-02-16 DIAGNOSIS — I495 Sick sinus syndrome: Secondary | ICD-10-CM | POA: Diagnosis not present

## 2021-02-16 DIAGNOSIS — Z9181 History of falling: Secondary | ICD-10-CM | POA: Diagnosis not present

## 2021-02-16 DIAGNOSIS — I69391 Dysphagia following cerebral infarction: Secondary | ICD-10-CM | POA: Diagnosis not present

## 2021-02-16 DIAGNOSIS — Z7901 Long term (current) use of anticoagulants: Secondary | ICD-10-CM | POA: Diagnosis not present

## 2021-02-16 DIAGNOSIS — Z79891 Long term (current) use of opiate analgesic: Secondary | ICD-10-CM | POA: Diagnosis not present

## 2021-02-16 DIAGNOSIS — I480 Paroxysmal atrial fibrillation: Secondary | ICD-10-CM | POA: Diagnosis not present

## 2021-02-16 DIAGNOSIS — K219 Gastro-esophageal reflux disease without esophagitis: Secondary | ICD-10-CM | POA: Diagnosis not present

## 2021-02-16 DIAGNOSIS — I5032 Chronic diastolic (congestive) heart failure: Secondary | ICD-10-CM | POA: Diagnosis not present

## 2021-02-16 DIAGNOSIS — H3530 Unspecified macular degeneration: Secondary | ICD-10-CM | POA: Diagnosis not present

## 2021-02-16 DIAGNOSIS — H548 Legal blindness, as defined in USA: Secondary | ICD-10-CM | POA: Diagnosis not present

## 2021-02-16 DIAGNOSIS — N1832 Chronic kidney disease, stage 3b: Secondary | ICD-10-CM | POA: Diagnosis not present

## 2021-02-16 DIAGNOSIS — E039 Hypothyroidism, unspecified: Secondary | ICD-10-CM | POA: Diagnosis not present

## 2021-02-16 DIAGNOSIS — G629 Polyneuropathy, unspecified: Secondary | ICD-10-CM | POA: Diagnosis not present

## 2021-02-16 DIAGNOSIS — I69351 Hemiplegia and hemiparesis following cerebral infarction affecting right dominant side: Secondary | ICD-10-CM | POA: Diagnosis not present

## 2021-02-16 DIAGNOSIS — G250 Essential tremor: Secondary | ICD-10-CM | POA: Diagnosis not present

## 2021-02-16 DIAGNOSIS — I69320 Aphasia following cerebral infarction: Secondary | ICD-10-CM | POA: Diagnosis not present

## 2021-02-16 DIAGNOSIS — N400 Enlarged prostate without lower urinary tract symptoms: Secondary | ICD-10-CM | POA: Diagnosis not present

## 2021-02-16 DIAGNOSIS — R1312 Dysphagia, oropharyngeal phase: Secondary | ICD-10-CM | POA: Diagnosis not present

## 2021-02-16 DIAGNOSIS — I69392 Facial weakness following cerebral infarction: Secondary | ICD-10-CM | POA: Diagnosis not present

## 2021-02-16 DIAGNOSIS — I1310 Hypertensive heart and chronic kidney disease without heart failure, with stage 1 through stage 4 chronic kidney disease, or unspecified chronic kidney disease: Secondary | ICD-10-CM | POA: Diagnosis not present

## 2021-02-16 DIAGNOSIS — G894 Chronic pain syndrome: Secondary | ICD-10-CM | POA: Diagnosis not present

## 2021-02-19 ENCOUNTER — Telehealth: Payer: Self-pay

## 2021-02-19 DIAGNOSIS — I1310 Hypertensive heart and chronic kidney disease without heart failure, with stage 1 through stage 4 chronic kidney disease, or unspecified chronic kidney disease: Secondary | ICD-10-CM | POA: Diagnosis not present

## 2021-02-19 DIAGNOSIS — I69392 Facial weakness following cerebral infarction: Secondary | ICD-10-CM | POA: Diagnosis not present

## 2021-02-19 DIAGNOSIS — I69391 Dysphagia following cerebral infarction: Secondary | ICD-10-CM | POA: Diagnosis not present

## 2021-02-19 DIAGNOSIS — I69351 Hemiplegia and hemiparesis following cerebral infarction affecting right dominant side: Secondary | ICD-10-CM | POA: Diagnosis not present

## 2021-02-19 DIAGNOSIS — R1312 Dysphagia, oropharyngeal phase: Secondary | ICD-10-CM | POA: Diagnosis not present

## 2021-02-19 DIAGNOSIS — I69320 Aphasia following cerebral infarction: Secondary | ICD-10-CM | POA: Diagnosis not present

## 2021-02-19 NOTE — Telephone Encounter (Signed)
Transitional Care call--son [name]    1. Are you/is patient experiencing any problems since coming home? No Are there any questions regarding any aspect of care? No 2. Are there any questions regarding medications administration/dosing? No Are meds being taken as prescribed? Yes Patient should review meds with caller to confirm 3. Have there been any falls? No 4. Has Home Health been to the house and/or have they contacted you? Yes If not, have you tried to contact them? Can we help you contact them? 5. Are bowels and bladder emptying properly? Yes Are there any unexpected incontinence issues? No If applicable, is patient following bowel/bladder programs? 6. Any fevers, problems with breathing, unexpected pain? No 7. Are there any skin problems or new areas of breakdown? Yes 8. Has the patient/family member arranged specialty MD follow up (ie cardiology/neurology/renal/surgical/etc)? Yes  Can we help arrange? 9. Does the patient need any other services or support that we can help arrange? No 10. Are caregivers following through as expected in assisting the patient? Yes 11. Has the patient quit smoking, drinking alcohol, or using drugs as recommended? Yes  Appointment time 10:00am, arrive time 9:40am with [place] [address] [phone]

## 2021-02-20 DIAGNOSIS — I69392 Facial weakness following cerebral infarction: Secondary | ICD-10-CM | POA: Diagnosis not present

## 2021-02-20 DIAGNOSIS — I69351 Hemiplegia and hemiparesis following cerebral infarction affecting right dominant side: Secondary | ICD-10-CM | POA: Diagnosis not present

## 2021-02-20 DIAGNOSIS — I1310 Hypertensive heart and chronic kidney disease without heart failure, with stage 1 through stage 4 chronic kidney disease, or unspecified chronic kidney disease: Secondary | ICD-10-CM | POA: Diagnosis not present

## 2021-02-20 DIAGNOSIS — I69320 Aphasia following cerebral infarction: Secondary | ICD-10-CM | POA: Diagnosis not present

## 2021-02-20 DIAGNOSIS — R1312 Dysphagia, oropharyngeal phase: Secondary | ICD-10-CM | POA: Diagnosis not present

## 2021-02-20 DIAGNOSIS — I69391 Dysphagia following cerebral infarction: Secondary | ICD-10-CM | POA: Diagnosis not present

## 2021-02-22 DIAGNOSIS — K219 Gastro-esophageal reflux disease without esophagitis: Secondary | ICD-10-CM | POA: Diagnosis not present

## 2021-02-22 DIAGNOSIS — I4891 Unspecified atrial fibrillation: Secondary | ICD-10-CM | POA: Diagnosis not present

## 2021-02-22 DIAGNOSIS — I63512 Cerebral infarction due to unspecified occlusion or stenosis of left middle cerebral artery: Secondary | ICD-10-CM | POA: Diagnosis not present

## 2021-02-22 DIAGNOSIS — E782 Mixed hyperlipidemia: Secondary | ICD-10-CM | POA: Diagnosis not present

## 2021-02-22 DIAGNOSIS — I69391 Dysphagia following cerebral infarction: Secondary | ICD-10-CM | POA: Diagnosis not present

## 2021-02-22 DIAGNOSIS — I5032 Chronic diastolic (congestive) heart failure: Secondary | ICD-10-CM | POA: Diagnosis not present

## 2021-02-22 DIAGNOSIS — E039 Hypothyroidism, unspecified: Secondary | ICD-10-CM | POA: Diagnosis not present

## 2021-02-22 DIAGNOSIS — I1310 Hypertensive heart and chronic kidney disease without heart failure, with stage 1 through stage 4 chronic kidney disease, or unspecified chronic kidney disease: Secondary | ICD-10-CM | POA: Diagnosis not present

## 2021-02-22 DIAGNOSIS — I69351 Hemiplegia and hemiparesis following cerebral infarction affecting right dominant side: Secondary | ICD-10-CM | POA: Diagnosis not present

## 2021-02-22 DIAGNOSIS — N400 Enlarged prostate without lower urinary tract symptoms: Secondary | ICD-10-CM | POA: Diagnosis not present

## 2021-02-22 DIAGNOSIS — D509 Iron deficiency anemia, unspecified: Secondary | ICD-10-CM | POA: Diagnosis not present

## 2021-02-22 DIAGNOSIS — G459 Transient cerebral ischemic attack, unspecified: Secondary | ICD-10-CM | POA: Diagnosis not present

## 2021-02-22 DIAGNOSIS — I69320 Aphasia following cerebral infarction: Secondary | ICD-10-CM | POA: Diagnosis not present

## 2021-02-22 DIAGNOSIS — R1312 Dysphagia, oropharyngeal phase: Secondary | ICD-10-CM | POA: Diagnosis not present

## 2021-02-22 DIAGNOSIS — N183 Chronic kidney disease, stage 3 unspecified: Secondary | ICD-10-CM | POA: Diagnosis not present

## 2021-02-22 DIAGNOSIS — F329 Major depressive disorder, single episode, unspecified: Secondary | ICD-10-CM | POA: Diagnosis not present

## 2021-02-22 DIAGNOSIS — I69392 Facial weakness following cerebral infarction: Secondary | ICD-10-CM | POA: Diagnosis not present

## 2021-02-22 DIAGNOSIS — I10 Essential (primary) hypertension: Secondary | ICD-10-CM | POA: Diagnosis not present

## 2021-02-26 DIAGNOSIS — M1712 Unilateral primary osteoarthritis, left knee: Secondary | ICD-10-CM | POA: Diagnosis not present

## 2021-02-26 DIAGNOSIS — I63512 Cerebral infarction due to unspecified occlusion or stenosis of left middle cerebral artery: Secondary | ICD-10-CM | POA: Diagnosis not present

## 2021-02-27 DIAGNOSIS — R1312 Dysphagia, oropharyngeal phase: Secondary | ICD-10-CM | POA: Diagnosis not present

## 2021-02-27 DIAGNOSIS — I69320 Aphasia following cerebral infarction: Secondary | ICD-10-CM | POA: Diagnosis not present

## 2021-02-27 DIAGNOSIS — I1310 Hypertensive heart and chronic kidney disease without heart failure, with stage 1 through stage 4 chronic kidney disease, or unspecified chronic kidney disease: Secondary | ICD-10-CM | POA: Diagnosis not present

## 2021-02-27 DIAGNOSIS — I69351 Hemiplegia and hemiparesis following cerebral infarction affecting right dominant side: Secondary | ICD-10-CM | POA: Diagnosis not present

## 2021-02-27 DIAGNOSIS — I69392 Facial weakness following cerebral infarction: Secondary | ICD-10-CM | POA: Diagnosis not present

## 2021-02-27 DIAGNOSIS — I69391 Dysphagia following cerebral infarction: Secondary | ICD-10-CM | POA: Diagnosis not present

## 2021-02-28 ENCOUNTER — Encounter: Payer: Medicare Other | Attending: Registered Nurse | Admitting: Registered Nurse

## 2021-02-28 ENCOUNTER — Other Ambulatory Visit: Payer: Self-pay

## 2021-02-28 ENCOUNTER — Encounter: Payer: Self-pay | Admitting: Registered Nurse

## 2021-02-28 VITALS — BP 143/75 | HR 69 | Temp 98.8°F | Ht 69.5 in | Wt 208.0 lb

## 2021-02-28 DIAGNOSIS — I1310 Hypertensive heart and chronic kidney disease without heart failure, with stage 1 through stage 4 chronic kidney disease, or unspecified chronic kidney disease: Secondary | ICD-10-CM | POA: Diagnosis not present

## 2021-02-28 DIAGNOSIS — I69320 Aphasia following cerebral infarction: Secondary | ICD-10-CM | POA: Diagnosis not present

## 2021-02-28 DIAGNOSIS — R1312 Dysphagia, oropharyngeal phase: Secondary | ICD-10-CM | POA: Diagnosis not present

## 2021-02-28 DIAGNOSIS — I639 Cerebral infarction, unspecified: Secondary | ICD-10-CM

## 2021-02-28 DIAGNOSIS — I5032 Chronic diastolic (congestive) heart failure: Secondary | ICD-10-CM | POA: Diagnosis not present

## 2021-02-28 DIAGNOSIS — I63512 Cerebral infarction due to unspecified occlusion or stenosis of left middle cerebral artery: Secondary | ICD-10-CM | POA: Diagnosis not present

## 2021-02-28 DIAGNOSIS — I69391 Dysphagia following cerebral infarction: Secondary | ICD-10-CM | POA: Diagnosis not present

## 2021-02-28 DIAGNOSIS — I69351 Hemiplegia and hemiparesis following cerebral infarction affecting right dominant side: Secondary | ICD-10-CM | POA: Diagnosis not present

## 2021-02-28 DIAGNOSIS — I69392 Facial weakness following cerebral infarction: Secondary | ICD-10-CM | POA: Diagnosis not present

## 2021-02-28 MED ORDER — APIXABAN 5 MG PO TABS: 5.0000 mg | ORAL_TABLET | Freq: Two times a day (BID) | ORAL | 1 refills | Status: DC

## 2021-02-28 NOTE — Progress Notes (Signed)
Subjective:    Patient ID: Douglas Walker, male    DOB: 1925-02-09, 87 y.o.   MRN: [id number]  HPI: Douglas Walker is a 87 y.o. male who is here for Transitional care visit for follow up of his Left Middle Cerebral Artery Stroke and Chronic Diastolic Congestive heart Failure. He presented to [facility] on 01/28/2021 for AMS and aphasia, [place] was called. Neurology consulted.  CT Head  IMPRESSION: 1. Aging brain without acute finding. CTA:  IMPRESSION: 1. Left M3/4 branch occlusion with 25 cc of ischemic range cerebral perfusion. 2. No emergent large vessel occlusion. 3. Atherosclerosis without proximal flow limiting stenosis. Douglas Walker was maintained on Eliquis for CVA prophylaxis.   Douglas Walker was admitted to inpatient Rehabilitation on 02/01/2021 and discharged home on 02/15/2021. He is receiving Home Health Therapy with [facility]. He denies any pain. He rated his pain on Health and History 4. Also reports he has a good appetite.   Douglas Walker is legally Blind, his son in room.    Pain Inventory Average Pain 4 Pain Right Now 4 My pain is sharp, stabbing and aching  LOCATION OF PAIN  Knee   BOWEL Number of stools per week: 5 Oral laxative use Yes  Type of laxative senna Enema or suppository use No  History of colostomy No  Incontinent No   BLADDER Normal In and out cath, frequency n/a  Able to self cath No  Bladder incontinence No  Frequent urination Yes  Leakage with coughing No  Difficulty starting stream No  Incomplete bladder emptying No    Mobility walk with assistance use a walker ability to climb steps?  yes do you drive?  no  Function retired I need assistance with the following:  bathing, meal prep, household duties and shopping  Neuro/Psych numbness tremor tingling trouble walking confusion depression anxiety  Prior Studies transitional care  Physicians involved in your care transitional  care   Family History  Problem Relation Age of Onset  . GI problems Mother   . Other Sister        PAIN ISSUES  . Hearing loss Sister   . Blindness Sister   . Heart attack Neg Hx   . Stroke Neg Hx    Social History   Socioeconomic History  . Marital status: Widowed    Spouse name: Not on file  . Number of children: 1  . Years of education: 62  . Highest education level: Not on file  Occupational History    Comment: retired  Tobacco Use  . Smoking status: Former Smoker    Quit date: 12/30/1962    Years since quitting: 58.2  . Smokeless tobacco: Never Used  Vaping Use  . Vaping Use: Never used  Substance and Sexual Activity  . Alcohol use: No  . Drug use: No  . Sexual activity: Not Currently  Other Topics Concern  . Not on file  Social History Narrative   Lives alone   Right-handed   Caffeine use- caffeine free coffee, occas soda   Social Determinants of Health   Financial Resource Strain: Not on file  Food Insecurity: Not on file  Transportation Needs: Not on file  Physical Activity: Not on file  Stress: Not on file  Social Connections: Not on file   Past Surgical History:  Procedure Laterality Date  . APPENDECTOMY    . EYE SURGERY    . HERNIA REPAIR    . HERNIA REPAIR    .  INSERT / REPLACE / REMOVE PACEMAKER  12/04/2018  . IRRIGATION AND DEBRIDEMENT ABSCESS Right 07/24/2020   Procedure: IRRIGATION AND DEBRIDEMENT HEMATOMA;  Surgeon: Coralie Keens, MD;  Location: [place];  Service: General;  Laterality: Right;  . MASS EXCISION Right 07/20/2020   Procedure: EXCISION OF RIGHT CHEST WALL MASS;  Surgeon: Stark Klein, MD;  Location: [place];  Service: General;  Laterality: Right;  . PACEMAKER IMPLANT N/A 12/04/2018   Procedure: PACEMAKER IMPLANT;  Surgeon: Evans Lance, MD;  Location: [place] [facility];  Service: Cardiovascular;  Laterality: N/A;   Past Medical History:  Diagnosis Date  . Arthritis    shoulders and back  . Cancer ([place])    skin cancers   . Chronic low back pain 12/15/2017  . CKD (chronic kidney disease) stage 3, GFR 30-59 ml/min (HCC)   . Coronary artery disease   . Dysrhythmia     Paroxysmal atrial fibrillation; Tachycardia-bradycardia syndrome  . Essential tremor 02/28/2016  . GERD (gastroesophageal reflux disease)   . Hernia   . History of hiatal hernia   . Hyperlipemia   . Hypertension   . Macular degeneration   . Neuromuscular disorder (HCC)    neuropathy  . Paroxysmal atrial fibrillation ([place]) 10/28/2013  . Presence of permanent cardiac pacemaker 12/04/2018  . Tachycardia-bradycardia syndrome ([place]) 09/13/2014  . TIA (transient ischemic attack)    BP (!) 143/75   Pulse 69   Temp 98.8 F (37.1 C)   Ht 5' 9.5" (1.765 m)   Wt 208 lb (94.3 kg)   SpO2 96%   BMI 30.28 kg/m   Opioid Risk Score:   Fall Risk Score:  `1  Depression screen PHQ 2/9  Depression screen PHQ 2/9 02/28/2021  Decreased Interest 3  Down, Depressed, Hopeless 3  PHQ - 2 Score 6  Altered sleeping 1  Tired, decreased energy 1  Change in appetite 0  Feeling bad or failure about yourself  3  Trouble concentrating 2  Moving slowly or fidgety/restless 1  Suicidal thoughts 3  PHQ-9 Score 17  Difficult doing work/chores Somewhat difficult    Review of Systems  Constitutional: Negative.   HENT: Negative.   Eyes: Negative.   Respiratory: Positive for apnea.   Cardiovascular: Negative.   Gastrointestinal: Negative.   Endocrine: Negative.   Genitourinary: Negative.   Musculoskeletal: Positive for arthralgias and gait problem.  Skin: Positive for rash.  Allergic/Immunologic: Negative.   Neurological: Positive for tremors and numbness.       Tingling  Hematological: Bruises/bleeds easily.  Psychiatric/Behavioral: Positive for confusion, dysphoric mood and suicidal ideas. The patient is nervous/anxious.   All other systems reviewed and are negative.      Objective:   Physical Exam Vitals and nursing note reviewed.   Constitutional:      Appearance: Normal appearance.  Cardiovascular:     Rate and Rhythm: Normal rate and regular rhythm.     Pulses: Normal pulses.     Heart sounds: Normal heart sounds.  Pulmonary:     Effort: Pulmonary effort is normal.     Breath sounds: Normal breath sounds.  Musculoskeletal:     Cervical back: Normal range of motion and neck supple.     Comments: Normal Muscle Bulk and Muscle Testing Reveals:  Upper Extremities: Full ROM and Muscle Strength 5/5 Lower Extremities: Decreased ROM and Muscle Strength 4/5 Arises from Table Slowly using walker for support Narrow based  Gait   Skin:    General: Skin is warm and  dry.  Neurological:     Mental Status: He is alert and oriented to person, place, and time.  Psychiatric:        Mood and Affect: Mood normal.        Behavior: Behavior normal.           Assessment & Plan:  1. Left Middle Cerebral Artery Stroke: Continue Home Health Therapy with [facility]. He has a scheduled appointment with Neurology. Continue to monitor.   2.Chronic Diastolic Congestive heart Failure: Continue current medication regimen..Cardiology Following.   F/U with Dr Dagoberto Ligas in 4- 6 weeks

## 2021-03-01 DIAGNOSIS — I69392 Facial weakness following cerebral infarction: Secondary | ICD-10-CM | POA: Diagnosis not present

## 2021-03-01 DIAGNOSIS — I69391 Dysphagia following cerebral infarction: Secondary | ICD-10-CM | POA: Diagnosis not present

## 2021-03-01 DIAGNOSIS — I69351 Hemiplegia and hemiparesis following cerebral infarction affecting right dominant side: Secondary | ICD-10-CM | POA: Diagnosis not present

## 2021-03-01 DIAGNOSIS — I69320 Aphasia following cerebral infarction: Secondary | ICD-10-CM | POA: Diagnosis not present

## 2021-03-01 DIAGNOSIS — I1310 Hypertensive heart and chronic kidney disease without heart failure, with stage 1 through stage 4 chronic kidney disease, or unspecified chronic kidney disease: Secondary | ICD-10-CM | POA: Diagnosis not present

## 2021-03-01 DIAGNOSIS — R1312 Dysphagia, oropharyngeal phase: Secondary | ICD-10-CM | POA: Diagnosis not present

## 2021-03-06 DIAGNOSIS — I69320 Aphasia following cerebral infarction: Secondary | ICD-10-CM | POA: Diagnosis not present

## 2021-03-06 DIAGNOSIS — I1310 Hypertensive heart and chronic kidney disease without heart failure, with stage 1 through stage 4 chronic kidney disease, or unspecified chronic kidney disease: Secondary | ICD-10-CM | POA: Diagnosis not present

## 2021-03-06 DIAGNOSIS — I69391 Dysphagia following cerebral infarction: Secondary | ICD-10-CM | POA: Diagnosis not present

## 2021-03-06 DIAGNOSIS — I69351 Hemiplegia and hemiparesis following cerebral infarction affecting right dominant side: Secondary | ICD-10-CM | POA: Diagnosis not present

## 2021-03-06 DIAGNOSIS — R1312 Dysphagia, oropharyngeal phase: Secondary | ICD-10-CM | POA: Diagnosis not present

## 2021-03-06 DIAGNOSIS — I69392 Facial weakness following cerebral infarction: Secondary | ICD-10-CM | POA: Diagnosis not present

## 2021-03-08 DIAGNOSIS — I69351 Hemiplegia and hemiparesis following cerebral infarction affecting right dominant side: Secondary | ICD-10-CM | POA: Diagnosis not present

## 2021-03-08 DIAGNOSIS — I69320 Aphasia following cerebral infarction: Secondary | ICD-10-CM | POA: Diagnosis not present

## 2021-03-08 DIAGNOSIS — I69391 Dysphagia following cerebral infarction: Secondary | ICD-10-CM | POA: Diagnosis not present

## 2021-03-08 DIAGNOSIS — R1312 Dysphagia, oropharyngeal phase: Secondary | ICD-10-CM | POA: Diagnosis not present

## 2021-03-08 DIAGNOSIS — I1310 Hypertensive heart and chronic kidney disease without heart failure, with stage 1 through stage 4 chronic kidney disease, or unspecified chronic kidney disease: Secondary | ICD-10-CM | POA: Diagnosis not present

## 2021-03-08 DIAGNOSIS — I69392 Facial weakness following cerebral infarction: Secondary | ICD-10-CM | POA: Diagnosis not present

## 2021-03-12 ENCOUNTER — Other Ambulatory Visit (HOSPITAL_COMMUNITY): Payer: Self-pay

## 2021-03-12 DIAGNOSIS — R059 Cough, unspecified: Secondary | ICD-10-CM

## 2021-03-12 DIAGNOSIS — I69391 Dysphagia following cerebral infarction: Secondary | ICD-10-CM | POA: Diagnosis not present

## 2021-03-12 DIAGNOSIS — I69320 Aphasia following cerebral infarction: Secondary | ICD-10-CM | POA: Diagnosis not present

## 2021-03-12 DIAGNOSIS — I69392 Facial weakness following cerebral infarction: Secondary | ICD-10-CM | POA: Diagnosis not present

## 2021-03-12 DIAGNOSIS — R1312 Dysphagia, oropharyngeal phase: Secondary | ICD-10-CM | POA: Diagnosis not present

## 2021-03-12 DIAGNOSIS — I69351 Hemiplegia and hemiparesis following cerebral infarction affecting right dominant side: Secondary | ICD-10-CM | POA: Diagnosis not present

## 2021-03-12 DIAGNOSIS — R1310 Dysphagia, unspecified: Secondary | ICD-10-CM

## 2021-03-12 DIAGNOSIS — I1310 Hypertensive heart and chronic kidney disease without heart failure, with stage 1 through stage 4 chronic kidney disease, or unspecified chronic kidney disease: Secondary | ICD-10-CM | POA: Diagnosis not present

## 2021-03-13 DIAGNOSIS — I69391 Dysphagia following cerebral infarction: Secondary | ICD-10-CM | POA: Diagnosis not present

## 2021-03-13 DIAGNOSIS — R1312 Dysphagia, oropharyngeal phase: Secondary | ICD-10-CM | POA: Diagnosis not present

## 2021-03-13 DIAGNOSIS — I69351 Hemiplegia and hemiparesis following cerebral infarction affecting right dominant side: Secondary | ICD-10-CM | POA: Diagnosis not present

## 2021-03-13 DIAGNOSIS — I1310 Hypertensive heart and chronic kidney disease without heart failure, with stage 1 through stage 4 chronic kidney disease, or unspecified chronic kidney disease: Secondary | ICD-10-CM | POA: Diagnosis not present

## 2021-03-13 DIAGNOSIS — I69320 Aphasia following cerebral infarction: Secondary | ICD-10-CM | POA: Diagnosis not present

## 2021-03-13 DIAGNOSIS — I69392 Facial weakness following cerebral infarction: Secondary | ICD-10-CM | POA: Diagnosis not present

## 2021-03-15 DIAGNOSIS — I1310 Hypertensive heart and chronic kidney disease without heart failure, with stage 1 through stage 4 chronic kidney disease, or unspecified chronic kidney disease: Secondary | ICD-10-CM | POA: Diagnosis not present

## 2021-03-15 DIAGNOSIS — I69391 Dysphagia following cerebral infarction: Secondary | ICD-10-CM | POA: Diagnosis not present

## 2021-03-15 DIAGNOSIS — I69351 Hemiplegia and hemiparesis following cerebral infarction affecting right dominant side: Secondary | ICD-10-CM | POA: Diagnosis not present

## 2021-03-15 DIAGNOSIS — R1312 Dysphagia, oropharyngeal phase: Secondary | ICD-10-CM | POA: Diagnosis not present

## 2021-03-15 DIAGNOSIS — I69392 Facial weakness following cerebral infarction: Secondary | ICD-10-CM | POA: Diagnosis not present

## 2021-03-15 DIAGNOSIS — I69320 Aphasia following cerebral infarction: Secondary | ICD-10-CM | POA: Diagnosis not present

## 2021-03-16 DIAGNOSIS — R319 Hematuria, unspecified: Secondary | ICD-10-CM | POA: Diagnosis not present

## 2021-03-18 DIAGNOSIS — Z9181 History of falling: Secondary | ICD-10-CM | POA: Diagnosis not present

## 2021-03-18 DIAGNOSIS — Z79891 Long term (current) use of opiate analgesic: Secondary | ICD-10-CM | POA: Diagnosis not present

## 2021-03-18 DIAGNOSIS — I69320 Aphasia following cerebral infarction: Secondary | ICD-10-CM | POA: Diagnosis not present

## 2021-03-18 DIAGNOSIS — M19012 Primary osteoarthritis, left shoulder: Secondary | ICD-10-CM | POA: Diagnosis not present

## 2021-03-18 DIAGNOSIS — M479 Spondylosis, unspecified: Secondary | ICD-10-CM | POA: Diagnosis not present

## 2021-03-18 DIAGNOSIS — H3530 Unspecified macular degeneration: Secondary | ICD-10-CM | POA: Diagnosis not present

## 2021-03-18 DIAGNOSIS — H548 Legal blindness, as defined in USA: Secondary | ICD-10-CM | POA: Diagnosis not present

## 2021-03-18 DIAGNOSIS — I1310 Hypertensive heart and chronic kidney disease without heart failure, with stage 1 through stage 4 chronic kidney disease, or unspecified chronic kidney disease: Secondary | ICD-10-CM | POA: Diagnosis not present

## 2021-03-18 DIAGNOSIS — G629 Polyneuropathy, unspecified: Secondary | ICD-10-CM | POA: Diagnosis not present

## 2021-03-18 DIAGNOSIS — I69351 Hemiplegia and hemiparesis following cerebral infarction affecting right dominant side: Secondary | ICD-10-CM | POA: Diagnosis not present

## 2021-03-18 DIAGNOSIS — Z7901 Long term (current) use of anticoagulants: Secondary | ICD-10-CM | POA: Diagnosis not present

## 2021-03-18 DIAGNOSIS — I2510 Atherosclerotic heart disease of native coronary artery without angina pectoris: Secondary | ICD-10-CM | POA: Diagnosis not present

## 2021-03-18 DIAGNOSIS — G894 Chronic pain syndrome: Secondary | ICD-10-CM | POA: Diagnosis not present

## 2021-03-18 DIAGNOSIS — I69391 Dysphagia following cerebral infarction: Secondary | ICD-10-CM | POA: Diagnosis not present

## 2021-03-18 DIAGNOSIS — N400 Enlarged prostate without lower urinary tract symptoms: Secondary | ICD-10-CM | POA: Diagnosis not present

## 2021-03-18 DIAGNOSIS — R1312 Dysphagia, oropharyngeal phase: Secondary | ICD-10-CM | POA: Diagnosis not present

## 2021-03-18 DIAGNOSIS — K219 Gastro-esophageal reflux disease without esophagitis: Secondary | ICD-10-CM | POA: Diagnosis not present

## 2021-03-18 DIAGNOSIS — M19011 Primary osteoarthritis, right shoulder: Secondary | ICD-10-CM | POA: Diagnosis not present

## 2021-03-18 DIAGNOSIS — N1832 Chronic kidney disease, stage 3b: Secondary | ICD-10-CM | POA: Diagnosis not present

## 2021-03-18 DIAGNOSIS — I495 Sick sinus syndrome: Secondary | ICD-10-CM | POA: Diagnosis not present

## 2021-03-18 DIAGNOSIS — E039 Hypothyroidism, unspecified: Secondary | ICD-10-CM | POA: Diagnosis not present

## 2021-03-18 DIAGNOSIS — G250 Essential tremor: Secondary | ICD-10-CM | POA: Diagnosis not present

## 2021-03-18 DIAGNOSIS — I480 Paroxysmal atrial fibrillation: Secondary | ICD-10-CM | POA: Diagnosis not present

## 2021-03-18 DIAGNOSIS — I5032 Chronic diastolic (congestive) heart failure: Secondary | ICD-10-CM | POA: Diagnosis not present

## 2021-03-18 DIAGNOSIS — I69392 Facial weakness following cerebral infarction: Secondary | ICD-10-CM | POA: Diagnosis not present

## 2021-03-19 DIAGNOSIS — I69351 Hemiplegia and hemiparesis following cerebral infarction affecting right dominant side: Secondary | ICD-10-CM | POA: Diagnosis not present

## 2021-03-19 DIAGNOSIS — I69391 Dysphagia following cerebral infarction: Secondary | ICD-10-CM | POA: Diagnosis not present

## 2021-03-19 DIAGNOSIS — I1310 Hypertensive heart and chronic kidney disease without heart failure, with stage 1 through stage 4 chronic kidney disease, or unspecified chronic kidney disease: Secondary | ICD-10-CM | POA: Diagnosis not present

## 2021-03-19 DIAGNOSIS — R1312 Dysphagia, oropharyngeal phase: Secondary | ICD-10-CM | POA: Diagnosis not present

## 2021-03-19 DIAGNOSIS — I69392 Facial weakness following cerebral infarction: Secondary | ICD-10-CM | POA: Diagnosis not present

## 2021-03-19 DIAGNOSIS — I69320 Aphasia following cerebral infarction: Secondary | ICD-10-CM | POA: Diagnosis not present

## 2021-03-20 ENCOUNTER — Ambulatory Visit (HOSPITAL_COMMUNITY)
Admission: RE | Admit: 2021-03-20 | Discharge: 2021-03-20 | Disposition: A | Discharge status: Home / Self Care | Payer: Medicare Other | Source: Ambulatory Visit | Attending: Internal Medicine | Admitting: Internal Medicine

## 2021-03-20 ENCOUNTER — Other Ambulatory Visit: Payer: Self-pay

## 2021-03-20 DIAGNOSIS — R1312 Dysphagia, oropharyngeal phase: Secondary | ICD-10-CM | POA: Insufficient documentation

## 2021-03-20 DIAGNOSIS — Z8673 Personal history of transient ischemic attack (TIA), and cerebral infarction without residual deficits: Secondary | ICD-10-CM | POA: Insufficient documentation

## 2021-03-20 DIAGNOSIS — R471 Dysarthria and anarthria: Secondary | ICD-10-CM | POA: Insufficient documentation

## 2021-03-20 DIAGNOSIS — R059 Cough, unspecified: Secondary | ICD-10-CM | POA: Insufficient documentation

## 2021-03-20 DIAGNOSIS — R1310 Dysphagia, unspecified: Secondary | ICD-10-CM | POA: Diagnosis not present

## 2021-03-20 DIAGNOSIS — Z0389 Encounter for observation for other suspected diseases and conditions ruled out: Secondary | ICD-10-CM | POA: Diagnosis not present

## 2021-03-20 NOTE — Progress Notes (Signed)
Objective Swallowing Evaluation: Type of Study: MBS-Modified Barium Swallow Study   Patient Details  Name: BOLTON CANUPP MRN: [id number] Date of Birth: 1925/04/17  Today's Date: 03/20/2021 Time: SLP Start Time (ACUTE ONLY): 1055 -SLP Stop Time (ACUTE ONLY): 1115  SLP Time Calculation (min) (ACUTE ONLY): 20 min   Past Medical History:  Past Medical History:  Diagnosis Date  . Arthritis    shoulders and back  . Cancer ([place])    skin cancers  . Chronic low back pain 12/15/2017  . CKD (chronic kidney disease) stage 3, GFR 30-59 ml/min (HCC)   . Coronary artery disease   . Dysrhythmia     Paroxysmal atrial fibrillation; Tachycardia-bradycardia syndrome  . Essential tremor 02/28/2016  . GERD (gastroesophageal reflux disease)   . Hernia   . History of hiatal hernia   . Hyperlipemia   . Hypertension   . Macular degeneration   . Neuromuscular disorder (HCC)    neuropathy  . Paroxysmal atrial fibrillation ([place]) 10/28/2013  . Presence of permanent cardiac pacemaker 12/04/2018  . Tachycardia-bradycardia syndrome ([place]) 09/13/2014  . TIA (transient ischemic attack)    Past Surgical History:  Past Surgical History:  Procedure Laterality Date  . APPENDECTOMY    . EYE SURGERY    . HERNIA REPAIR    . HERNIA REPAIR    . INSERT / REPLACE / REMOVE PACEMAKER  12/04/2018  . IRRIGATION AND DEBRIDEMENT ABSCESS Right 07/24/2020   Procedure: IRRIGATION AND DEBRIDEMENT HEMATOMA;  Surgeon: Coralie Keens, MD;  Location: [place];  Service: General;  Laterality: Right;  . MASS EXCISION Right 07/20/2020   Procedure: EXCISION OF RIGHT CHEST WALL MASS;  Surgeon: Stark Klein, MD;  Location: [place];  Service: General;  Laterality: Right;  . PACEMAKER IMPLANT N/A 12/04/2018   Procedure: PACEMAKER IMPLANT;  Surgeon: Evans Lance, MD;  Location: [place] [facility];  Service: Cardiovascular;  Laterality: N/A;   HPI: Patient is a 87 y.o. male with PMH: paroxysmal afib, not on anticoagulants due to  frequent falls, peripheral neuropathy, essential tremor, HTN, CKD stage IIIB, who had recent admission to [facility] (acute stay followed by CIR 2/3-2/17) for left MCA stroke with resulting aphasia and dysphagia. Pt had two prior MBS studies and participated in dysphagia tx while admitted; now receiving [facility] SLP. He continues to eat soft solid diet with nectar thick liquids at home per his son.  Last MBS on 2/16 revealed mild dysphagia with silent aspiration of thin liquids; no benefit from chin tuck; mild delays in mastication; no pharyngeal residue.   Subjective: alert, cooperative    Assessment / Plan / Recommendation  CHL IP CLINICAL IMPRESSIONS 03/20/2021  Clinical Impression Pt presents with improved oropharyngeal swallow since last MBS. He continues to demonstrate mild issues with bolus cohesion with piecemeal bolusing of solids into pharynx.  Pharyngeal swallow is triggered at the level of the pyriforms for thin liquids- this leads to occasional penetration of thin liquids before the swallow, reaching the level of the vocal folds (trace amount) but never observed to be aspirated.  There was good pharyngeal contraction with no residue post-swallow. Given the pt's improving mobility and the benefits that offers for staving off aspiration pna, as well as improved pharyngeal swallow and no observed aspiration, recommend allowing thin liquids - take small sips and don't mix with solids in mouth. Continue soft solids. D/W pt and son after study. Pt will benefit from continued [facility] SLP to address safety/toleration.  SLP Visit Diagnosis Dysphagia, oropharyngeal phase (R13.12)  Attention and concentration deficit following --  Frontal lobe and executive function deficit following --  Impact on safety and function Mild aspiration risk      CHL IP TREATMENT RECOMMENDATION 03/20/2021  Treatment Recommendations Defer treatment plan to f/u with SLP     Prognosis 02/08/2021  Prognosis for Safe Diet Advancement Fair   Barriers to Reach Goals --  Barriers/Prognosis Comment --    CHL IP DIET RECOMMENDATION 03/20/2021  SLP Diet Recommendations Dysphagia 3 (Mech soft) solids;Thin liquid  Liquid Administration via Cup;Straw  Medication Administration Whole meds with puree  Compensations Minimize environmental distractions;Small sips/bites  Postural Changes --      CHL IP OTHER RECOMMENDATIONS 03/20/2021  Recommended Consults --  Oral Care Recommendations Oral care BID  Other Recommendations --      CHL IP FOLLOW UP RECOMMENDATIONS 03/20/2021  Follow up Recommendations Home health SLP      Amanda L. Tivis Ringer, [place] CCC/SLP Acute Rehabilitation Services Office number [phone] Pager [phone]                Juan Quam Laurice 03/20/2021, 2:06 PM

## 2021-03-21 DIAGNOSIS — I1310 Hypertensive heart and chronic kidney disease without heart failure, with stage 1 through stage 4 chronic kidney disease, or unspecified chronic kidney disease: Secondary | ICD-10-CM | POA: Diagnosis not present

## 2021-03-21 DIAGNOSIS — I69320 Aphasia following cerebral infarction: Secondary | ICD-10-CM | POA: Diagnosis not present

## 2021-03-21 DIAGNOSIS — I69351 Hemiplegia and hemiparesis following cerebral infarction affecting right dominant side: Secondary | ICD-10-CM | POA: Diagnosis not present

## 2021-03-21 DIAGNOSIS — R1312 Dysphagia, oropharyngeal phase: Secondary | ICD-10-CM | POA: Diagnosis not present

## 2021-03-21 DIAGNOSIS — I69391 Dysphagia following cerebral infarction: Secondary | ICD-10-CM | POA: Diagnosis not present

## 2021-03-21 DIAGNOSIS — I69392 Facial weakness following cerebral infarction: Secondary | ICD-10-CM | POA: Diagnosis not present

## 2021-03-23 DIAGNOSIS — I69392 Facial weakness following cerebral infarction: Secondary | ICD-10-CM | POA: Diagnosis not present

## 2021-03-23 DIAGNOSIS — R1312 Dysphagia, oropharyngeal phase: Secondary | ICD-10-CM | POA: Diagnosis not present

## 2021-03-23 DIAGNOSIS — I69391 Dysphagia following cerebral infarction: Secondary | ICD-10-CM | POA: Diagnosis not present

## 2021-03-23 DIAGNOSIS — I1310 Hypertensive heart and chronic kidney disease without heart failure, with stage 1 through stage 4 chronic kidney disease, or unspecified chronic kidney disease: Secondary | ICD-10-CM | POA: Diagnosis not present

## 2021-03-23 DIAGNOSIS — I69351 Hemiplegia and hemiparesis following cerebral infarction affecting right dominant side: Secondary | ICD-10-CM | POA: Diagnosis not present

## 2021-03-23 DIAGNOSIS — I69320 Aphasia following cerebral infarction: Secondary | ICD-10-CM | POA: Diagnosis not present

## 2021-03-26 DIAGNOSIS — I1310 Hypertensive heart and chronic kidney disease without heart failure, with stage 1 through stage 4 chronic kidney disease, or unspecified chronic kidney disease: Secondary | ICD-10-CM | POA: Diagnosis not present

## 2021-03-26 DIAGNOSIS — R1312 Dysphagia, oropharyngeal phase: Secondary | ICD-10-CM | POA: Diagnosis not present

## 2021-03-26 DIAGNOSIS — I69391 Dysphagia following cerebral infarction: Secondary | ICD-10-CM | POA: Diagnosis not present

## 2021-03-26 DIAGNOSIS — I69392 Facial weakness following cerebral infarction: Secondary | ICD-10-CM | POA: Diagnosis not present

## 2021-03-26 DIAGNOSIS — I69320 Aphasia following cerebral infarction: Secondary | ICD-10-CM | POA: Diagnosis not present

## 2021-03-26 DIAGNOSIS — I69351 Hemiplegia and hemiparesis following cerebral infarction affecting right dominant side: Secondary | ICD-10-CM | POA: Diagnosis not present

## 2021-03-29 DIAGNOSIS — I5032 Chronic diastolic (congestive) heart failure: Secondary | ICD-10-CM | POA: Diagnosis not present

## 2021-04-02 DIAGNOSIS — I69351 Hemiplegia and hemiparesis following cerebral infarction affecting right dominant side: Secondary | ICD-10-CM | POA: Diagnosis not present

## 2021-04-02 DIAGNOSIS — I1310 Hypertensive heart and chronic kidney disease without heart failure, with stage 1 through stage 4 chronic kidney disease, or unspecified chronic kidney disease: Secondary | ICD-10-CM | POA: Diagnosis not present

## 2021-04-02 DIAGNOSIS — I69320 Aphasia following cerebral infarction: Secondary | ICD-10-CM | POA: Diagnosis not present

## 2021-04-02 DIAGNOSIS — R1312 Dysphagia, oropharyngeal phase: Secondary | ICD-10-CM | POA: Diagnosis not present

## 2021-04-02 DIAGNOSIS — I69392 Facial weakness following cerebral infarction: Secondary | ICD-10-CM | POA: Diagnosis not present

## 2021-04-02 DIAGNOSIS — I69391 Dysphagia following cerebral infarction: Secondary | ICD-10-CM | POA: Diagnosis not present

## 2021-04-04 ENCOUNTER — Ambulatory Visit (INDEPENDENT_AMBULATORY_CARE_PROVIDER_SITE_OTHER): Payer: Medicare Other | Admitting: Podiatry

## 2021-04-04 ENCOUNTER — Encounter: Payer: Self-pay | Admitting: Podiatry

## 2021-04-04 ENCOUNTER — Other Ambulatory Visit: Payer: Self-pay

## 2021-04-04 DIAGNOSIS — M79676 Pain in unspecified toe(s): Secondary | ICD-10-CM

## 2021-04-04 DIAGNOSIS — Z8551 Personal history of malignant neoplasm of bladder: Secondary | ICD-10-CM | POA: Insufficient documentation

## 2021-04-04 DIAGNOSIS — R1032 Left lower quadrant pain: Secondary | ICD-10-CM | POA: Insufficient documentation

## 2021-04-04 DIAGNOSIS — D696 Thrombocytopenia, unspecified: Secondary | ICD-10-CM | POA: Diagnosis not present

## 2021-04-04 DIAGNOSIS — R32 Unspecified urinary incontinence: Secondary | ICD-10-CM | POA: Insufficient documentation

## 2021-04-04 DIAGNOSIS — E669 Obesity, unspecified: Secondary | ICD-10-CM | POA: Insufficient documentation

## 2021-04-04 DIAGNOSIS — R946 Abnormal results of thyroid function studies: Secondary | ICD-10-CM | POA: Insufficient documentation

## 2021-04-04 DIAGNOSIS — B351 Tinea unguium: Secondary | ICD-10-CM | POA: Diagnosis not present

## 2021-04-04 DIAGNOSIS — K219 Gastro-esophageal reflux disease without esophagitis: Secondary | ICD-10-CM | POA: Insufficient documentation

## 2021-04-04 DIAGNOSIS — L608 Other nail disorders: Secondary | ICD-10-CM

## 2021-04-04 DIAGNOSIS — R251 Tremor, unspecified: Secondary | ICD-10-CM | POA: Insufficient documentation

## 2021-04-04 DIAGNOSIS — S2239XA Fracture of one rib, unspecified side, initial encounter for closed fracture: Secondary | ICD-10-CM | POA: Insufficient documentation

## 2021-04-04 DIAGNOSIS — E1142 Type 2 diabetes mellitus with diabetic polyneuropathy: Secondary | ICD-10-CM

## 2021-04-04 DIAGNOSIS — L293 Anogenital pruritus, unspecified: Secondary | ICD-10-CM | POA: Insufficient documentation

## 2021-04-04 DIAGNOSIS — R001 Bradycardia, unspecified: Secondary | ICD-10-CM | POA: Insufficient documentation

## 2021-04-04 DIAGNOSIS — M79609 Pain in unspecified limb: Secondary | ICD-10-CM | POA: Insufficient documentation

## 2021-04-04 DIAGNOSIS — N400 Enlarged prostate without lower urinary tract symptoms: Secondary | ICD-10-CM | POA: Insufficient documentation

## 2021-04-04 DIAGNOSIS — G5790 Unspecified mononeuropathy of unspecified lower limb: Secondary | ICD-10-CM | POA: Insufficient documentation

## 2021-04-04 DIAGNOSIS — R531 Weakness: Secondary | ICD-10-CM | POA: Insufficient documentation

## 2021-04-04 DIAGNOSIS — R222 Localized swelling, mass and lump, trunk: Secondary | ICD-10-CM | POA: Insufficient documentation

## 2021-04-04 DIAGNOSIS — R351 Nocturia: Secondary | ICD-10-CM | POA: Insufficient documentation

## 2021-04-04 DIAGNOSIS — M7581 Other shoulder lesions, right shoulder: Secondary | ICD-10-CM | POA: Insufficient documentation

## 2021-04-04 DIAGNOSIS — I130 Hypertensive heart and chronic kidney disease with heart failure and stage 1 through stage 4 chronic kidney disease, or unspecified chronic kidney disease: Secondary | ICD-10-CM | POA: Insufficient documentation

## 2021-04-04 DIAGNOSIS — F329 Major depressive disorder, single episode, unspecified: Secondary | ICD-10-CM | POA: Insufficient documentation

## 2021-04-04 DIAGNOSIS — M1712 Unilateral primary osteoarthritis, left knee: Secondary | ICD-10-CM | POA: Insufficient documentation

## 2021-04-04 DIAGNOSIS — M5416 Radiculopathy, lumbar region: Secondary | ICD-10-CM | POA: Insufficient documentation

## 2021-04-04 DIAGNOSIS — C673 Malignant neoplasm of anterior wall of bladder: Secondary | ICD-10-CM | POA: Insufficient documentation

## 2021-04-04 DIAGNOSIS — I672 Cerebral atherosclerosis: Secondary | ICD-10-CM | POA: Insufficient documentation

## 2021-04-04 DIAGNOSIS — D689 Coagulation defect, unspecified: Secondary | ICD-10-CM

## 2021-04-04 DIAGNOSIS — R609 Edema, unspecified: Secondary | ICD-10-CM | POA: Insufficient documentation

## 2021-04-04 DIAGNOSIS — D508 Other iron deficiency anemias: Secondary | ICD-10-CM | POA: Insufficient documentation

## 2021-04-04 DIAGNOSIS — D6869 Other thrombophilia: Secondary | ICD-10-CM | POA: Insufficient documentation

## 2021-04-04 DIAGNOSIS — E039 Hypothyroidism, unspecified: Secondary | ICD-10-CM | POA: Insufficient documentation

## 2021-04-04 DIAGNOSIS — E782 Mixed hyperlipidemia: Secondary | ICD-10-CM | POA: Insufficient documentation

## 2021-04-04 DIAGNOSIS — R5383 Other fatigue: Secondary | ICD-10-CM | POA: Insufficient documentation

## 2021-04-04 DIAGNOSIS — C44311 Basal cell carcinoma of skin of nose: Secondary | ICD-10-CM | POA: Insufficient documentation

## 2021-04-04 DIAGNOSIS — K5901 Slow transit constipation: Secondary | ICD-10-CM | POA: Insufficient documentation

## 2021-04-04 NOTE — Progress Notes (Signed)
This patient returns to my office for at risk foot care.  This patient requires this care by a professional since this patient will be at risk due to having peripheral neuropathy, CKD, Coagulation defect and thrombocytopenia.  This patient is unable to cut nails himself since the patient cannot reach his nails.These nails are painful walking and wearing shoes.  This patient presents for at risk foot care today.  He presents to the office with male son.  He is taking coumadin.  Patient had stroke in 01/28/21 and is taking eliquis.  General Appearance  Alert, conversant and in no acute stress.  Vascular  Dorsalis pedis and posterior tibial  pulses are not  palpable  bilaterally.  Capillary return is within normal limits  bilaterally. Temperature is within normal limits  Bilaterally. Swelling feet/legs  Neurologic  Senn-Weinstein monofilament wire test within normal limits  bilaterally. Muscle power within normal limits bilaterally.  Nails Thick disfigured discolored nails with subungual debris  from hallux to fifth toes bilaterally. Pincer nails hallux  B/L. No evidence of bacterial infection or drainage bilaterally.  Orthopedic  No limitations of motion  feet .  No crepitus or effusions noted.  No bony pathology or digital deformities noted.  Skin  normotropic skin with no porokeratosis noted bilaterally.  No signs of infections or ulcers noted.     Onychomycosis  Pain in right toes  Pain in left toes  Consent was obtained for treatment procedures.   Mechanical debridement of nails 1-5  bilaterally performed with a nail nipper.  Filed with dremel without incident. Told him to soak his right foot in soapy sudsy water.   Return office visit  10 weeks                   Told patient to return for periodic foot care and evaluation due to potential at risk complications.   Gardiner Barefoot DPM

## 2021-04-06 ENCOUNTER — Ambulatory Visit (INDEPENDENT_AMBULATORY_CARE_PROVIDER_SITE_OTHER): Payer: Medicare Other

## 2021-04-06 DIAGNOSIS — I495 Sick sinus syndrome: Secondary | ICD-10-CM

## 2021-04-06 LAB — CUP PACEART REMOTE DEVICE CHECK
Battery Remaining Longevity: 51 mo
Battery Voltage: 2.95 V
Brady Statistic AP VP Percent: 99.93 %
Brady Statistic AP VS Percent: 0 %
Brady Statistic AS VP Percent: 0 %
Brady Statistic AS VS Percent: 0.07 %
Brady Statistic RA Percent Paced: 99.97 %
Brady Statistic RV Percent Paced: 99.93 %
Date Time Interrogation Session: 20220407203802
Implantable Lead Implant Date: 20191206
Implantable Lead Implant Date: 20191206
Implantable Lead Location: 753860
Implantable Lead Location: 753860
Implantable Lead Model: 3830
Implantable Lead Model: 5076
Implantable Pulse Generator Implant Date: 20191206
Lead Channel Impedance Value: 304 Ohm
Lead Channel Impedance Value: 323 Ohm
Lead Channel Impedance Value: 399 Ohm
Lead Channel Impedance Value: 399 Ohm
Lead Channel Sensing Intrinsic Amplitude: 2.875 mV
Lead Channel Sensing Intrinsic Amplitude: 4.25 mV
Lead Channel Sensing Intrinsic Amplitude: 4.625 mV
Lead Channel Sensing Intrinsic Amplitude: 4.625 mV
Lead Channel Setting Pacing Amplitude: 2 V
Lead Channel Setting Pacing Amplitude: 2.5 V
Lead Channel Setting Pacing Pulse Width: 0.4 ms
Lead Channel Setting Sensing Sensitivity: 1.2 mV

## 2021-04-09 ENCOUNTER — Ambulatory Visit: Payer: Medicare Other | Admitting: Physical Medicine and Rehabilitation

## 2021-04-09 DIAGNOSIS — N3021 Other chronic cystitis with hematuria: Secondary | ICD-10-CM | POA: Diagnosis not present

## 2021-04-20 NOTE — Progress Notes (Signed)
Remote pacemaker transmission.   

## 2021-04-23 ENCOUNTER — Other Ambulatory Visit: Payer: Self-pay

## 2021-04-23 ENCOUNTER — Encounter: Payer: Medicare Other | Attending: Registered Nurse | Admitting: Physical Medicine and Rehabilitation

## 2021-04-23 ENCOUNTER — Encounter: Payer: Self-pay | Admitting: Physical Medicine and Rehabilitation

## 2021-04-23 VITALS — BP 142/66 | HR 70 | Temp 98.2°F | Ht 69.5 in | Wt 204.0 lb

## 2021-04-23 DIAGNOSIS — R269 Unspecified abnormalities of gait and mobility: Secondary | ICD-10-CM | POA: Insufficient documentation

## 2021-04-23 DIAGNOSIS — I63412 Cerebral infarction due to embolism of left middle cerebral artery: Secondary | ICD-10-CM | POA: Diagnosis not present

## 2021-04-23 DIAGNOSIS — F329 Major depressive disorder, single episode, unspecified: Secondary | ICD-10-CM | POA: Insufficient documentation

## 2021-04-23 DIAGNOSIS — G8191 Hemiplegia, unspecified affecting right dominant side: Secondary | ICD-10-CM | POA: Diagnosis not present

## 2021-04-23 DIAGNOSIS — G629 Polyneuropathy, unspecified: Secondary | ICD-10-CM | POA: Insufficient documentation

## 2021-04-23 MED ORDER — DULOXETINE HCL 60 MG PO CPEP: 60.0000 mg | ORAL_CAPSULE | Freq: Every day | ORAL | 11 refills | Status: DC

## 2021-04-23 NOTE — Progress Notes (Signed)
Subjective:    Patient ID: Douglas Walker, male    DOB: 03-22-1925, 87 y.o.   MRN: [id number]  HPI  Pt is a 87 yr old male with recent L cortical infarct with R hemiparesis and aphasia, as well as PAF, pacemaker, CKD3B, sick sinus syndrome per chart, dCHF, essential tremor, dysphagia due to recent stroke, here for f/u on stroke Sx's.   Did go back for MBSS- new one- didn't aspirate- clearing throat more-  swallowing was better-  So thicken his liquids when eating food, but give thin liquids when drinking by itself-   Aphasia- still "iffy".  Sometimes "comes out pretty good", but "sometimes cannot get the words out". Tries not to speak in complete sentences (but pt won't try that).   Hasn't noticed any muscle tightness.   "always have pain". - starting to give 1 tramadol/day.  Had leftover pills from before- still going through them right now.   PCP prescribes the Tramadol.  Asking who started Eiliquis Rx-  Had stroke (hx of being on Coumadin).     Doing a good job drinking a lot of fluids.  Gives applesauce with pills- no water.   Pain Inventory Average Pain 6 Pain Right Now 5 My pain is intermittent, burning and tingling  In the last 24 hours, has pain interfered with the following? General activity 0 Relation with others 0 Enjoyment of life 5 What TIME of day is your pain at its worst? daytime Sleep (in general) Fair  Pain is worse with: walking and bending Pain improves with: rest and medication Relief from Meds: 5  Family History  Problem Relation Age of Onset  . GI problems Mother   . Other Sister        PAIN ISSUES  . Hearing loss Sister   . Blindness Sister   . Heart attack Neg Hx   . Stroke Neg Hx    Social History   Socioeconomic History  . Marital status: Widowed    Spouse name: Not on file  . Number of children: 1  . Years of education: 72  . Highest education level: Not on file  Occupational History    Comment: retired  Tobacco Use  .  Smoking status: Former Smoker    Quit date: 12/30/1962    Years since quitting: 58.3  . Smokeless tobacco: Never Used  Vaping Use  . Vaping Use: Never used  Substance and Sexual Activity  . Alcohol use: No  . Drug use: No  . Sexual activity: Not Currently  Other Topics Concern  . Not on file  Social History Narrative   Lives alone   Right-handed   Caffeine use- caffeine free coffee, occas soda   Social Determinants of Health   Financial Resource Strain: Not on file  Food Insecurity: Not on file  Transportation Needs: Not on file  Physical Activity: Not on file  Stress: Not on file  Social Connections: Not on file   Past Surgical History:  Procedure Laterality Date  . APPENDECTOMY    . EYE SURGERY    . HERNIA REPAIR    . HERNIA REPAIR    . INSERT / REPLACE / REMOVE PACEMAKER  12/04/2018  . IRRIGATION AND DEBRIDEMENT ABSCESS Right 07/24/2020   Procedure: IRRIGATION AND DEBRIDEMENT HEMATOMA;  Surgeon: Coralie Keens, MD;  Location: [place];  Service: General;  Laterality: Right;  . MASS EXCISION Right 07/20/2020   Procedure: EXCISION OF RIGHT CHEST WALL MASS;  Surgeon: Stark Klein, MD;  [facility];  Service: General;  Laterality: Right;  . PACEMAKER IMPLANT N/A 12/04/2018   Procedure: PACEMAKER IMPLANT;  Surgeon: Evans Lance, MD;  Location: [place] [facility];  Service: Cardiovascular;  Laterality: N/A;   Past Surgical History:  Procedure Laterality Date  . APPENDECTOMY    . EYE SURGERY    . HERNIA REPAIR    . HERNIA REPAIR    . INSERT / REPLACE / REMOVE PACEMAKER  12/04/2018  . IRRIGATION AND DEBRIDEMENT ABSCESS Right 07/24/2020   Procedure: IRRIGATION AND DEBRIDEMENT HEMATOMA;  Surgeon: Coralie Keens, MD;  Location: [place];  Service: General;  Laterality: Right;  . MASS EXCISION Right 07/20/2020   Procedure: EXCISION OF RIGHT CHEST WALL MASS;  Surgeon: Stark Klein, MD;  Location: [place];  Service: General;  Laterality: Right;  . PACEMAKER IMPLANT N/A  12/04/2018   Procedure: PACEMAKER IMPLANT;  Surgeon: Evans Lance, MD;  Location: [place] [facility];  Service: Cardiovascular;  Laterality: N/A;   Past Medical History:  Diagnosis Date  . Arthritis    shoulders and back  . Cancer ([place])    skin cancers  . Chronic low back pain 12/15/2017  . CKD (chronic kidney disease) stage 3, GFR 30-59 ml/min (HCC)   . Coronary artery disease   . Dysrhythmia     Paroxysmal atrial fibrillation; Tachycardia-bradycardia syndrome  . Essential tremor 02/28/2016  . GERD (gastroesophageal reflux disease)   . Hernia   . History of hiatal hernia   . Hyperlipemia   . Hypertension   . Macular degeneration   . Neuromuscular disorder (HCC)    neuropathy  . Paroxysmal atrial fibrillation ([employer]) 10/28/2013  . Presence of permanent cardiac pacemaker 12/04/2018  . Tachycardia-bradycardia syndrome ([place]) 09/13/2014  . TIA (transient ischemic attack)    BP (!) 142/66   Pulse 70   Temp 98.2 F (36.8 C)   Ht 5' 9.5" (1.765 m)   Wt 204 lb (92.5 kg)   SpO2 94%   BMI 29.69 kg/m   Opioid Risk Score:   Fall Risk Score:  `1  Depression screen PHQ 2/9  Depression screen PHQ 2/9 02/28/2021  Decreased Interest 3  Down, Depressed, Hopeless 3  PHQ - 2 Score 6  Altered sleeping 1  Tired, decreased energy 1  Change in appetite 0  Feeling bad or failure about yourself  3  Trouble concentrating 2  Moving slowly or fidgety/restless 1  Suicidal thoughts 3  PHQ-9 Score 17  Difficult doing work/chores Somewhat difficult  Some recent data might be hidden    Review of Systems  Constitutional: Negative.   HENT: Negative.   Eyes: Negative.   Respiratory: Negative.   Cardiovascular: Negative.   Gastrointestinal: Negative.   Endocrine: Negative.   Genitourinary: Negative.   Musculoskeletal: Positive for arthralgias, back pain and gait problem.  Skin: Negative.   Allergic/Immunologic: Negative.   Hematological: Negative.   All other systems reviewed and are  negative.      Objective:   Physical Exam   Awake, alert, appropriate, accompanied by son; has RW and used to get in room, NAD Neuro: No increased tone or spasms seen- has RUE tremors, but no Hoffman's; perseverative on commands- can't move forward to  next task easily. Mild to moderate aphasia/word searching.   MS:  RUE- biceps 5-/5, triceps 5-/5, WE- pt couldn't do; grip 4+/5, and finger abd 4+/5 RLE- 5-/5 in HF, KE, KD and PF LUE/LLE 5/5  2+ LE edema to mid calf B/L-  Assessment & Plan:   Pt is a 87 yr old male with recent L cortical infarct with R hemiparesis and aphasia, as well as PAF, pacemaker, CKD3B, sick sinus syndrome per chart, dCHF, essential tremor, dysphagia due to recent stroke, here for f/u on stroke Sx's.   1. Ask Neurology- About Eiliquis-  Since hx of Coumadin.   2. Will get Tramadol from PCP. Continue for pain.   3. Drinks 16 oz with each meal and some between- check with Cards about amount of fluid he's drinking- usually require 60oz max  4. No spasticity- unlikely to develop since has been 3 months.   5. Continue Duloxetine for mood and pain. Has depression- premorbid. Has neuropathy in legs as well . Sent in 60 mg daily- and 11 refills.   6. Reset brain with counting to 10 and can go on to next topic- perseveration is the cause- and complicates aphasia and following tasks.   7. Can try an app for phone (since done with therapy) for aphasia. Do a few times per week.   8. F/U as needed-    I spent a total of 25 minutes going over perseveration and aphasia and how ot handle- unlikely to develop spasticity- went over that as well.

## 2021-04-23 NOTE — Patient Instructions (Signed)
  Pt is a 87 yr old male with recent L cortical infarct with R hemiparesis and aphasia, as well as PAF, pacemaker, CKD3B, sick sinus syndrome per chart, dCHF, essential tremor, dysphagia due to recent stroke, here for f/u on stroke Sx's.   1. Ask Neurology- About Eiliquis-  Since hx of Coumadin.   2. Will get Tramadol from PCP. Continue for pain.   3. Drinks 16 oz with each meal and some between- check with Cards about amount of fluid he's drinking- usually require 60oz max  4. No spasticity- unlikely to develop since has been 3 months.   5. Continue Duloxetine for mood and pain. Has depression- premorbid. Has neuropathy in legs as well . Sent in 60 mg daily- and 11 refills.   6. Reset brain with counting to 10 and can go on to next topic- perseveration is the cause- and complicates aphasia and following tasks.   7. Can try an app for phone (since done with therapy) for aphasia. Do a few times per week.   8. F/U as needed-

## 2021-04-24 NOTE — Progress Notes (Signed)
Cardiology Office Note:    Date:  04/24/2021   ID:  [name], DOB 10/14/1925, MRN [id number]  PCP:  Josetta Huddle, MD  Cardiologist:  Sinclair Grooms, MD   Referring MD: Josetta Huddle, MD   No chief complaint on file.   History of Present Illness:    Douglas Walker is a 87 y.o. male with a hx of  tachycardia-bradycardia(SSS) syndrome per 30 day monitor, history of recurring syncopal episodes, history of paroxysmal atrial fibrillation, chronic anticoagulation therapy, prior history of TIA,coronary artery disease, and PPM therapy 11/24/2018.  Mr. 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  Back at home.  Not in a nursing home.  Had a stent and rehab after CVA.  He is ambulatory with a 4 pronged walker.  Stroke is left him with some speech difficulty.  He has not had palpitations.  No episodes of syncope.  Pacemaker function has been normal.  Past Medical History:  Diagnosis Date  . Arthritis    shoulders and back  . Cancer ([place])    skin cancers  . Chronic low back pain 12/15/2017  . CKD (chronic kidney disease) stage 3, GFR 30-59 ml/min (HCC)   . Coronary artery disease   . Dysrhythmia     Paroxysmal atrial fibrillation; Tachycardia-bradycardia syndrome  . Essential tremor 02/28/2016  . GERD (gastroesophageal reflux disease)   . Hernia   . History of hiatal hernia   . Hyperlipemia   . Hypertension   . Macular degeneration   . Neuromuscular disorder (HCC)    neuropathy  . Paroxysmal atrial fibrillation ([place]) 10/28/2013  . Presence of permanent cardiac pacemaker 12/04/2018  . Tachycardia-bradycardia syndrome ([place]) 09/13/2014  . TIA (transient ischemic attack)     Past Surgical History:  Procedure Laterality Date  . APPENDECTOMY    . EYE SURGERY    . HERNIA REPAIR    . HERNIA REPAIR    . INSERT / REPLACE / REMOVE PACEMAKER  12/04/2018  . IRRIGATION AND DEBRIDEMENT ABSCESS Right 07/24/2020   Procedure: IRRIGATION AND  DEBRIDEMENT HEMATOMA;  Surgeon: Coralie Keens, MD;  Location: [place];  Service: General;  Laterality: Right;  . MASS EXCISION Right 07/20/2020   Procedure: EXCISION OF RIGHT CHEST WALL MASS;  Surgeon: Stark Klein, MD;  Location: [place];  Service: General;  Laterality: Right;  . PACEMAKER IMPLANT N/A 12/04/2018   Procedure: PACEMAKER IMPLANT;  Surgeon: Evans Lance, MD;  Location: [facility];  Service: Cardiovascular;  Laterality: N/A;    Current Medications: No outpatient medications have been marked as taking for the 04/25/21 encounter (Appointment) with Belva Crome, MD.     Allergies:   Sulfa antibiotics, Primidone, and Simvastatin   Social History   Socioeconomic History  . Marital status: Widowed    Spouse name: Not on file  . Number of children: 1  . Years of education: 97  . Highest education level: Not on file  Occupational History    Comment: retired  Tobacco Use  . Smoking status: Former Smoker    Quit date: 12/30/1962    Years since quitting: 58.3  . Smokeless tobacco: Never Used  Vaping Use  . Vaping Use: Never used  Substance and Sexual Activity  . Alcohol use: No  . Drug use: No  . Sexual activity: Not Currently  Other Topics Concern  . Not on file  Social History Narrative   Lives alone   Right-handed  Caffeine use- caffeine free coffee, occas soda   Social Determinants of Health   Financial Resource Strain: Not on file  Food Insecurity: Not on file  Transportation Needs: Not on file  Physical Activity: Not on file  Stress: Not on file  Social Connections: Not on file     Family History: The patient's family history includes Blindness in his sister; GI problems in his mother; Hearing loss in his sister; Other in his sister. There is no history of Heart attack or Stroke.  ROS:   Please see the history of present illness.    Had difficulty healing lumbar spine operative site and also developed a hematoma.  This led to further  operations.  All other systems reviewed and are negative.  EKGs/Labs/Other Studies Reviewed:    The following studies were reviewed today:  2D Doppler ECHOCARDIOGRAM 01/28/2021: IMPRESSIONS    1. Left ventricular ejection fraction, by estimation, is 50 to 55%. The  left ventricle has low normal function. The left ventricle has no regional  wall motion abnormalities. Left ventricular diastolic function could not  be evaluated.  2. Right ventricular systolic function is mildly reduced. The right  ventricular size is normal. There is moderately elevated pulmonary artery  systolic pressure.  3. Left atrial size was moderately dilated.  4. Right atrial size was moderately dilated.  5. The mitral valve is normal in structure. No evidence of mitral valve  regurgitation.  6. The aortic valve is tricuspid. Aortic valve regurgitation is mild.  Mild aortic valve sclerosis is present, with no evidence of aortic valve  stenosis.  7. Aortic dilatation noted. There is moderate dilatation of the ascending  aorta, measuring 47 mm.  8. The inferior vena cava is dilated in size with <50% respiratory  variability, suggesting right atrial pressure of 15 mmHg.   Comparison(s): Prior images unable to be directly viewed, comparison made  by report only. Ascending aorta dilation has worsened since 2019 (but  measurement is similar to CT angio report from may 2021).    EKG:  EKG EKG is not performed.   Recent Labs: 08/24/2020: TSH 3.564 01/29/2021: B Natriuretic Peptide 391.1 02/01/2021: Magnesium 2.1 02/12/2021: ALT 32; Hemoglobin 11.1; Platelets 131 02/14/2021: BUN 22; Creatinine, Ser 1.31; Potassium 3.7; Sodium 140  Recent Lipid Panel    Component Value Date/Time   CHOL 188 01/29/2021 0237   TRIG 96 01/29/2021 0237   HDL 43 01/29/2021 0237   CHOLHDL 4.4 01/29/2021 0237   VLDL 19 01/29/2021 0237   LDLCALC 126 (H) 01/29/2021 0237    Physical Exam:    VS:  There were no vitals taken  for this visit.    Wt Readings from Last 3 Encounters:  04/23/21 204 lb (92.5 kg)  02/28/21 208 lb (94.3 kg)  02/15/21 206 lb 9.1 oz (93.7 kg)     GEN: Skin has ecchymoses.. No acute distress HEENT: Normal NECK: No JVD. LYMPHATICS: No lymphadenopathy CARDIAC: 1/6 systolic murmur. RRR no gallop, or edema. VASCULAR:  Normal Pulses. No bruits. RESPIRATORY:  Clear to auscultation without rales, wheezing or rhonchi  ABDOMEN: Soft, non-tender, non-distended, No pulsatile mass, MUSCULOSKELETAL: No deformity  SKIN: Warm and dry NEUROLOGIC:  Alert and oriented x 3 PSYCHIATRIC:  Normal affect   ASSESSMENT:    1. Sick sinus syndrome ([place])   2. Pacemaker   3. Paroxysmal atrial fibrillation (HCC)   4. Chronic diastolic CHF (congestive heart failure) ([place])   5. Encounter for therapeutic drug monitoring   6. Essential hypertension  7. Chronic anticoagulation    PLAN:    In order of problems listed above:  1. Stable with pacemaker therapy.  Now also on Eliquis.  Was previously on Coumadin.  Had a recent CVA. 2. Pacemaker is being followed in device clinic 3. Presumed that stroke may have been related to brief atrial fibrillation while off Coumadin therapy 4. No evidence of volume overload 5. Continue Eliquis therapy 6. Continue furosemide. 7. Eliquis therapy as mentioned.  68-month clinical follow-up   Medication Adjustments/Labs and Tests Ordered: Current medicines are reviewed at length with the patient today.  Concerns regarding medicines are outlined above.  No orders of the defined types were placed in this encounter.  No orders of the defined types were placed in this encounter.   There are no Patient Instructions on file for this visit.   Signed, Sinclair Grooms, MD  04/24/2021 3:15 PM    [facility]

## 2021-04-25 ENCOUNTER — Ambulatory Visit (INDEPENDENT_AMBULATORY_CARE_PROVIDER_SITE_OTHER): Payer: Medicare Other | Admitting: Interventional Cardiology

## 2021-04-25 ENCOUNTER — Encounter: Payer: Self-pay | Admitting: Interventional Cardiology

## 2021-04-25 ENCOUNTER — Other Ambulatory Visit: Payer: Self-pay

## 2021-04-25 VITALS — BP 130/60 | HR 69 | Ht 69.5 in | Wt 201.0 lb

## 2021-04-25 DIAGNOSIS — I495 Sick sinus syndrome: Secondary | ICD-10-CM | POA: Diagnosis not present

## 2021-04-25 DIAGNOSIS — I10 Essential (primary) hypertension: Secondary | ICD-10-CM | POA: Diagnosis not present

## 2021-04-25 DIAGNOSIS — I639 Cerebral infarction, unspecified: Secondary | ICD-10-CM | POA: Diagnosis not present

## 2021-04-25 DIAGNOSIS — Z5181 Encounter for therapeutic drug level monitoring: Secondary | ICD-10-CM

## 2021-04-25 DIAGNOSIS — Z950 Presence of cardiac pacemaker: Secondary | ICD-10-CM

## 2021-04-25 DIAGNOSIS — I480 Paroxysmal atrial fibrillation: Secondary | ICD-10-CM

## 2021-04-25 DIAGNOSIS — I5032 Chronic diastolic (congestive) heart failure: Secondary | ICD-10-CM | POA: Diagnosis not present

## 2021-04-25 DIAGNOSIS — Z7901 Long term (current) use of anticoagulants: Secondary | ICD-10-CM

## 2021-04-25 NOTE — Patient Instructions (Signed)
Medication Instructions:  Your physician recommends that you continue on your current medications as directed. Please refer to the Current Medication list given to you today.  *If you need a refill on your cardiac medications before your next appointment, please call your pharmacy*   Lab Work: None If you have labs (blood work) drawn today and your tests are completely normal, you will receive your results only by: . MyChart Message (if you have MyChart) OR . A paper copy in the mail If you have any lab test that is abnormal or we need to change your treatment, we will call you to review the results.   Testing/Procedures: None   Follow-Up: At [facility], you and your health needs are our priority.  As part of our continuing mission to provide you with exceptional heart care, we have created designated Provider Care Teams.  These Care Teams include your primary Cardiologist (physician) and Advanced Practice Providers (APPs -  Physician Assistants and Nurse Practitioners) who all work together to provide you with the care you need, when you need it.  We recommend signing up for the patient portal called "MyChart".  Sign up information is provided on this After Visit Summary.  MyChart is used to connect with patients for Virtual Visits (Telemedicine).  Patients are able to view lab/test results, encounter notes, upcoming appointments, etc.  Non-urgent messages can be sent to your provider as well.   To learn more about what you can do with MyChart, go to [email or web address].    Your next appointment:   6 month(s)  The format for your next appointment:   In Person  Provider:   You may see Henry W Smith III, MD or one of the following Advanced Practice Providers on your designated Care Team:    Jill McDaniel, NP    Other Instructions   

## 2021-04-27 ENCOUNTER — Encounter: Payer: Self-pay | Admitting: Neurology

## 2021-04-27 ENCOUNTER — Other Ambulatory Visit: Payer: Self-pay

## 2021-04-27 ENCOUNTER — Ambulatory Visit (INDEPENDENT_AMBULATORY_CARE_PROVIDER_SITE_OTHER): Payer: Medicare Other | Admitting: Neurology

## 2021-04-27 VITALS — BP 102/59 | HR 69 | Ht 71.4 in | Wt 202.8 lb

## 2021-04-27 DIAGNOSIS — I639 Cerebral infarction, unspecified: Secondary | ICD-10-CM

## 2021-04-27 DIAGNOSIS — G250 Essential tremor: Secondary | ICD-10-CM

## 2021-04-27 DIAGNOSIS — N3021 Other chronic cystitis with hematuria: Secondary | ICD-10-CM | POA: Diagnosis not present

## 2021-04-27 DIAGNOSIS — I5032 Chronic diastolic (congestive) heart failure: Secondary | ICD-10-CM | POA: Diagnosis not present

## 2021-04-27 DIAGNOSIS — I63512 Cerebral infarction due to unspecified occlusion or stenosis of left middle cerebral artery: Secondary | ICD-10-CM

## 2021-04-27 NOTE — Progress Notes (Signed)
Reason for visit: Stroke, essential tremor, peripheral neuropathy, gait disorder  Referring physician: [place] is a 87 y.o. male  History of present illness:  [name] is a 87 year old right-handed white male with a history of atrial fibrillation, he has a pacemaker in place.  He has an essential tremor and a peripheral neuropathy with a gait disorder associated with this.  He went into the hospital on 28 January 2021 with onset of a left middle cerebral artery distribution stroke.  The patient presented with aphasia and confusion.  The patient has had difficulty with swallowing, he was followed through speech therapy for a time.  The patient underwent inpatient rehab and then returned home with home health therapy that has since ended.  The patient has regained most of his speech but his speech has never fully returned to normal, he still has some word finding problems.  A CT perfusion study suggested a left middle cerebral artery distribution stroke.  The patient was off of Coumadin because of bleeding complications following surgery on the back, the patient had recurring hematoma.  This surgical site however has now healed.  The patient was placed on Eliquis in the hospital, but he has remained on primidone 250 mg twice daily, he also takes Lyrica for his peripheral neuropathy and Topamax for his tremor as well.  The patient is sent to this office for further evaluation.  He is at home living with his family, the note that he is fairly independent.  He is able to bathe himself, do his toiletries, and dress himself.  The patient has occasional constipation, his bladder is working fairly well.  He reports no other new numbness or weakness of the arms or legs.  He walks with a walker, he has not had any falls, he is safe with ambulation.   Past Medical History:  Diagnosis Date  . Arthritis    shoulders and back  . Cancer ([place])    skin cancers  . Chronic low back  pain 12/15/2017  . CKD (chronic kidney disease) stage 3, GFR 30-59 ml/min (HCC)   . Coronary artery disease   . Dysrhythmia     Paroxysmal atrial fibrillation; Tachycardia-bradycardia syndrome  . Essential tremor 02/28/2016  . GERD (gastroesophageal reflux disease)   . Hernia   . History of hiatal hernia   . Hyperlipemia   . Hypertension   . Macular degeneration   . Neuromuscular disorder (HCC)    neuropathy  . Paroxysmal atrial fibrillation ([place]) 10/28/2013  . Presence of permanent cardiac pacemaker 12/04/2018  . Stroke ([place]) 12/2020  . Tachycardia-bradycardia syndrome ([place]) 09/13/2014  . TIA (transient ischemic attack)     Past Surgical History:  Procedure Laterality Date  . APPENDECTOMY    . EYE SURGERY    . HERNIA REPAIR    . HERNIA REPAIR    . INSERT / REPLACE / REMOVE PACEMAKER  12/04/2018  . IRRIGATION AND DEBRIDEMENT ABSCESS Right 07/24/2020   Procedure: IRRIGATION AND DEBRIDEMENT HEMATOMA;  Surgeon: Coralie Keens, MD;  Location: [place];  Service: General;  Laterality: Right;  . MASS EXCISION Right 07/20/2020   Procedure: EXCISION OF RIGHT CHEST WALL MASS;  Surgeon: Stark Klein, MD;  Location: [place];  Service: General;  Laterality: Right;  . PACEMAKER IMPLANT N/A 12/04/2018   Procedure: PACEMAKER IMPLANT;  Surgeon: Evans Lance, MD;  Location: [facility];  Service: Cardiovascular;  Laterality: N/A;    Family History  Problem Relation  Age of Onset  . GI problems Mother   . Other Sister        PAIN ISSUES  . Hearing loss Sister   . Blindness Sister   . Heart attack Neg Hx   . Stroke Neg Hx     Social history:  reports that he quit smoking about 58 years ago. He has never used smokeless tobacco. He reports that he does not drink alcohol and does not use drugs.  Medications:  Prior to Admission medications   Medication Sig Start Date End Date Taking? Authorizing Provider  acetaminophen (TYLENOL) 325 MG tablet Take 2 tablets (650 mg total) by mouth  every 4 (four) hours as needed for mild pain (or temp > 37.5 C (99.5 F)). 02/14/21  Yes Angiulli, Lavon Paganini, PA-C  apixaban (ELIQUIS) 5 MG TABS tablet Take 1 tablet (5 mg total) by mouth 2 (two) times daily. 02/28/21  Yes Belva Crome, MD  DULoxetine (CYMBALTA) 60 MG capsule Take 1 capsule (60 mg total) by mouth daily. 04/23/21  Yes Lovorn, Jinny Blossom, MD  finasteride (PROSCAR) 5 MG tablet Take 1 tablet (5 mg total) by mouth daily. 02/14/21  Yes Angiulli, Lavon Paganini, PA-C  fluocinonide cream (LIDEX) 0.05 % Apply topically 3 (three) times daily. Apply to affected area 02/14/21  Yes Angiulli, Lavon Paganini, PA-C  furosemide (LASIX) 20 MG tablet Take 2 tablets (40 mg total) by mouth daily. 02/14/21  Yes Angiulli, Lavon Paganini, PA-C  levothyroxine (SYNTHROID) 25 MCG tablet Take 1 tablet (25 mcg total) by mouth daily before breakfast. 02/14/21  Yes Angiulli, Lavon Paganini, PA-C  Maltodextrin-Xanthan Gum (RESOURCE THICKENUP CLEAR) POWD Take 120 g by mouth as needed. 02/14/21  Yes Angiulli, Lavon Paganini, PA-C  Multiple Vitamin (MULTIVITAMIN WITH MINERALS) TABS tablet Take 1 tablet by mouth daily.   Yes [provider]  pregabalin (LYRICA) 25 MG capsule TAKE 1 CAPSULE(25 MG) BY MOUTH AT BEDTIME 02/14/21  Yes Angiulli, Lavon Paganini, PA-C  primidone (MYSOLINE) 250 MG tablet Take 1 tablet (250 mg total) by mouth 2 (two) times daily. 02/14/21  Yes Angiulli, Lavon Paganini, PA-C  senna (SENOKOT) 8.6 MG tablet Take 1 tablet by mouth daily as needed for constipation.   Yes [provider]  tamsulosin (FLOMAX) 0.4 MG CAPS capsule Take 1 capsule (0.4 mg total) by mouth 2 (two) times daily. 02/14/21  Yes Angiulli, Lavon Paganini, PA-C  topiramate (TOPAMAX) 50 MG tablet Take 1 tablet (50 mg total) by mouth 2 (two) times daily. 02/14/21  Yes Angiulli, Lavon Paganini, PA-C  traMADol (ULTRAM) 50 MG tablet Take 1 tablet (50 mg total) by mouth every 6 (six) hours as needed for severe pain (if tylenol not effective). 02/14/21  Yes Angiulli, Lavon Paganini, PA-C  vitamin  B-12 (CYANOCOBALAMIN) 1000 MCG tablet Take 1 tablet (1,000 mcg total) by mouth every other day. 02/14/21  Yes Angiulli, Lavon Paganini, PA-C  apixaban (ELIQUIS) 5 MG TABS tablet Take 1 tablet (5 mg total) by mouth 2 (two) times daily. 02/14/21   Angiulli, Lavon Paganini, PA-C  nitrofurantoin, macrocrystal-monohydrate, (MACROBID) 100 MG capsule 1 capsule with food 03/20/21   [provider]      Allergies  Allergen Reactions  . Sulfa Antibiotics Other (See Comments)    Weakness, dizziness  . Primidone     Other reaction(s): increased sedation  . Simvastatin     Other reaction(s): arthralgias and fatigue, severe    ROS:  Out of a complete 14 system review of symptoms, the patient complains only  of the following symptoms, and all other reviewed systems are negative.  Hard of hearing Walking difficulty Numbness in the feet and hands Speech problems  Blood pressure (!) 102/59, pulse 69, height 5' 11.4" (1.814 m), weight 202 lb 12.8 oz (92 kg).  Physical Exam  General: The patient is alert and cooperative at the time of the examination.  Eyes: Pupils are pinpoint bilaterally.  Discs cannot be visualized.  Neck: The neck is supple, no carotid bruits are noted.  Respiratory: The respiratory examination is clear.  Cardiovascular: The cardiovascular examination reveals a regular rate and rhythm, no obvious murmurs or rubs are noted.  Skin: Extremities are with 1-2+ edema below the knees bilaterally.  Neurologic Exam  Mental status: The patient is alert and oriented x 3 at the time of the examination. The patient has apparent normal recent and remote memory, with an apparently normal attention span and concentration ability.  Cranial nerves: Facial symmetry is present. There is good sensation of the face to pinprick and soft touch bilaterally. The strength of the facial muscles and the muscles to head turning and shoulder shrug are normal bilaterally. Speech is well enunciated, no aphasia  or dysarthria is noted. Extraocular movements are full. Visual fields are full. The tongue is midline, and the patient has symmetric elevation of the soft palate. No obvious hearing deficits are noted.  Motor: The motor testing reveals 5 over 5 strength of all 4 extremities. Good symmetric motor tone is noted throughout.  Sensory: Sensory testing is intact to pinprick, soft touch, vibration sensation, and position sense on all 4 extremities.  No definite sensory stocking pattern deficit was noted in the legs.  No evidence of extinction is noted.  Coordination: Cerebellar testing reveals good finger-nose-finger and heel-to-shin bilaterally.  Gait and station: Gait is wide-based, the patient walks with a walker.  Reflexes: Deep tendon reflexes are symmetric, but are depressed bilaterally. Toes are downgoing bilaterally.   CTA head and neck, perfusion study 01/28/21:  IMPRESSION: 1. Left M3/4 branch occlusion with 25 cc of ischemic range cerebral perfusion. 2. No emergent large vessel occlusion. 3. Atherosclerosis without proximal flow limiting stenosis.   CT head 01/28/21:  IMPRESSION: 1. Aging brain without acute finding. 2. ASPECTS is 10  * CT scan images were reviewed online. I agree with the written report.   2D echo 01/28/21:  IMPRESSIONS    1. Left ventricular ejection fraction, by estimation, is 50 to 55%. The  left ventricle has low normal function. The left ventricle has no regional  wall motion abnormalities. Left ventricular diastolic function could not  be evaluated.  2. Right ventricular systolic function is mildly reduced. The right  ventricular size is normal. There is moderately elevated pulmonary artery  systolic pressure.  3. Left atrial size was moderately dilated.  4. Right atrial size was moderately dilated.  5. The mitral valve is normal in structure. No evidence of mitral valve  regurgitation.  6. The aortic valve is tricuspid. Aortic valve  regurgitation is mild.  Mild aortic valve sclerosis is present, with no evidence of aortic valve  stenosis.  7. Aortic dilatation noted. There is moderate dilatation of the ascending  aorta, measuring 47 mm.  8. The inferior vena cava is dilated in size with <50% respiratory  variability, suggesting right atrial pressure of 15 mmHg.     Assessment/Plan:  1.  Left middle cerebral artery distribution stroke  2.  Atrial fibrillation  3.  Essential tremor  4.  Gait disorder  5.  Peripheral neuropathy  The patient was apparently placed back on anticoagulation therapy with Eliquis in the hospital through Dr. Leonie Man, however the patient is on primidone which does have an interaction with Eliquis.  The use of primidone will make the Eliquis less effective.  I would recommend that he get back into the Coumadin clinic and have his medication managed there.  The patient will continue his Topamax and primidone and Lyrica for now.  He will follow-up here in 6 months.  He has had a relatively good recovery from the stroke.  Jill Alexanders MD 04/27/2021 10:53 AM  [facility] [address] [place], [place] [address]  Phone [phone] Fax [phone]

## 2021-04-30 NOTE — Progress Notes (Signed)
Please help with Mr. Moch.  Please note the concerns of Dr. Jannifer Franklin about interaction between Eliquis and a medicine used for gait disturbance.  Should he be converted to Coumadin as suggested?  Is that the same issue with Xarelto?

## 2021-05-01 ENCOUNTER — Telehealth: Payer: Self-pay | Admitting: Pharmacist

## 2021-05-01 DIAGNOSIS — N3021 Other chronic cystitis with hematuria: Secondary | ICD-10-CM | POA: Diagnosis not present

## 2021-05-01 DIAGNOSIS — K5730 Diverticulosis of large intestine without perforation or abscess without bleeding: Secondary | ICD-10-CM | POA: Diagnosis not present

## 2021-05-01 DIAGNOSIS — N281 Cyst of kidney, acquired: Secondary | ICD-10-CM | POA: Diagnosis not present

## 2021-05-01 MED ORDER — WARFARIN SODIUM 5 MG PO TABS: ORAL_TABLET | ORAL | 1 refills | Status: DC

## 2021-05-01 MED ORDER — DABIGATRAN ETEXILATE MESYLATE 150 MG PO CAPS: 150.0000 mg | ORAL_CAPSULE | Freq: Two times a day (BID) | ORAL | 5 refills | Status: DC

## 2021-05-01 MED ORDER — EDOXABAN TOSYLATE 30 MG PO TABS: 30.0000 mg | ORAL_TABLET | Freq: Every day | ORAL | 5 refills | Status: DC

## 2021-05-01 NOTE — Telephone Encounter (Signed)
Spoke with pt's son, Douglas Walker. He is in agreement with pt changing back to warfarin due to drug interaction with primidone and Eliquis. Will resume warfarin at prior therapeutic dose he took in 2021 which was 7.5mg  daily except 5mg  on Mondays and Fridays, first dose today. Pt will complete 3-4 day overlap with Eliquis with last dose 5/6 in AM to help provide better anticoagulation coverage while INR increases, especially given hx of CVA (other option would be bridging with Lovenox which is less desirable given pt's age/higher bleeding risk of Lovenox than Eliquis). INR check scheduled for Monday 5/9. Pt's son verbalized understanding of treatment plan.

## 2021-05-01 NOTE — Telephone Encounter (Signed)
Savaysa prior authorization approved through 12/29/21. Rx sent to pharmacy for Savaysa and Pradaxa to see which is more affordable. Waited on hold for over 30 minutes with [facility] pharmacy to speak with someone. Pradaxa is $211/month, Savaysa is $166/month, and he has been paying $45 for Eliquis.  Due to cost, Eliquis or warfarin would be the 2 choices for anticoagulation for pt. Eliquis carries major drug interaction with primidone making it less effective. Warfarin would require pt to resume in-office INR monitoring. Given his age and combination of fall history/blindness (per son - legally blind in L eye and reduced vision in R eye), this may not be the safest either.  Will forward to Dr Jannifer Franklin and Dr Tamala Julian for input.

## 2021-05-01 NOTE — Telephone Encounter (Addendum)
Received message from Dr Jannifer Franklin with neuro regarding drug interaction between pt's Eliquis and primidone. These meds have a major interaction due to decreased efficacy of Eliquis. Pt was previously on warfarin. He had a stroke and was admitted 01/28/21-02/01/21 and had been off warfarin since July of 2021 due to bleeding complications following back surgery with recurrent hematoma. Plan was for pt to follow up with PCP regarding risk/benefit of restarting anticoagulation. Cards visit 08/2020 stated pt was not a candidate for anticoag due to history of falls. He was subsequently placed on Eliquis in the hospital 12/2020 after CVA by Dr Leonie Man on 2/1. Per discussion with son, falls at home have not been a recent issue with pt since he is familiar with the layout of his home.  I am following up with pt's insurance to see if they will cover Savaysa or Pradaxa instead, which would avoid the major interaction with primidone and be a safer option for pt to take. Changing back to warfarin is an option but would be preferred last line due to prior hematoma on warfarin and pt's advanced age.   Author: Belva Crome, MD Service: Cardiology Author Type: Physician  Filed: 04/30/2021 6:15 PM Encounter Date: 04/27/2021 Status: Signed  Editor: Belva Crome, MD (Physician)      Please help with Douglas Walker.  Please note the concerns of Dr. Jannifer Franklin about interaction between Eliquis and a medicine used for gait disturbance.  Should he be converted to Coumadin as suggested?  Is that the same issue with Xarelto?

## 2021-05-01 NOTE — Addendum Note (Signed)
Addended by: Breyonna Nault E on: 05/01/2021 04:32 PM   Modules accepted: Orders

## 2021-05-01 NOTE — Telephone Encounter (Signed)
The Coumadin still may be the best option, does require monitoring but is much more affordable and will be effective with the primidone.

## 2021-05-03 DIAGNOSIS — D6869 Other thrombophilia: Secondary | ICD-10-CM | POA: Diagnosis not present

## 2021-05-03 DIAGNOSIS — G629 Polyneuropathy, unspecified: Secondary | ICD-10-CM | POA: Diagnosis not present

## 2021-05-03 DIAGNOSIS — I480 Paroxysmal atrial fibrillation: Secondary | ICD-10-CM | POA: Diagnosis not present

## 2021-05-03 DIAGNOSIS — G250 Essential tremor: Secondary | ICD-10-CM | POA: Diagnosis not present

## 2021-05-03 DIAGNOSIS — L570 Actinic keratosis: Secondary | ICD-10-CM | POA: Diagnosis not present

## 2021-05-03 DIAGNOSIS — I5032 Chronic diastolic (congestive) heart failure: Secondary | ICD-10-CM | POA: Diagnosis not present

## 2021-05-07 ENCOUNTER — Other Ambulatory Visit: Payer: Self-pay

## 2021-05-07 ENCOUNTER — Ambulatory Visit (INDEPENDENT_AMBULATORY_CARE_PROVIDER_SITE_OTHER): Payer: Medicare Other

## 2021-05-07 DIAGNOSIS — Z5181 Encounter for therapeutic drug level monitoring: Secondary | ICD-10-CM | POA: Diagnosis not present

## 2021-05-07 DIAGNOSIS — I480 Paroxysmal atrial fibrillation: Secondary | ICD-10-CM | POA: Diagnosis not present

## 2021-05-07 LAB — POCT INR: INR: 2.2 (ref 2.0–3.0)

## 2021-05-07 NOTE — Patient Instructions (Signed)
Continue taking 1.5 tablets everyday except for 1 tablet on Mondays and Fridays.  Recheck INR in 3 weeks.  Call # [phone] if placed on any new medications.

## 2021-05-10 ENCOUNTER — Other Ambulatory Visit: Payer: Self-pay

## 2021-05-10 ENCOUNTER — Encounter (INDEPENDENT_AMBULATORY_CARE_PROVIDER_SITE_OTHER): Payer: Self-pay | Admitting: Ophthalmology

## 2021-05-10 ENCOUNTER — Ambulatory Visit (INDEPENDENT_AMBULATORY_CARE_PROVIDER_SITE_OTHER): Payer: Medicare Other | Admitting: Ophthalmology

## 2021-05-10 DIAGNOSIS — H3561 Retinal hemorrhage, right eye: Secondary | ICD-10-CM | POA: Diagnosis not present

## 2021-05-10 DIAGNOSIS — H353212 Exudative age-related macular degeneration, right eye, with inactive choroidal neovascularization: Secondary | ICD-10-CM | POA: Diagnosis not present

## 2021-05-10 DIAGNOSIS — H16213 Exposure keratoconjunctivitis, bilateral: Secondary | ICD-10-CM | POA: Diagnosis not present

## 2021-05-10 DIAGNOSIS — H353134 Nonexudative age-related macular degeneration, bilateral, advanced atrophic with subfoveal involvement: Secondary | ICD-10-CM | POA: Diagnosis not present

## 2021-05-10 NOTE — Assessment & Plan Note (Signed)
No significant SPK visible today

## 2021-05-10 NOTE — Assessment & Plan Note (Signed)
No active CNVM OU, stable OU

## 2021-05-10 NOTE — Assessment & Plan Note (Signed)
This condition resolved not active today

## 2021-05-10 NOTE — Progress Notes (Signed)
05/10/2021     CHIEF COMPLAINT Patient presents for Retina Follow Up (1 year fu OU and OCT/Pt states VA OU stable since last visit. Pt denies FOL, floaters, or ocular pain OU. /Pt's son advises that pt had a recent stroke 12/2020)   HISTORY OF PRESENT ILLNESS: Douglas Walker is a 87 y.o. male who presents to the clinic today for:   HPI    Retina Follow Up    Diagnosis: Wet AMD   Laterality: right eye   Onset: 1 year ago   Severity: mild   Duration: 1 year   Course: stable   Comments: 1 year fu OU and OCT Pt states VA OU stable since last visit. Pt denies FOL, floaters, or ocular pain OU.  Pt's son advises that pt had a recent stroke 12/2020       Last edited by Kendra Opitz, COA on 05/10/2021  9:37 AM. (History)      Referring physician: Josetta Huddle, MD [address] [place],  [place] [address]  HISTORICAL INFORMATION:   Selected notes from the MEDICAL RECORD NUMBER    Lab Results  Component Value Date   HGBA1C 5.3 01/29/2021     CURRENT MEDICATIONS: No current outpatient medications on file. (Ophthalmic Drugs)   No current facility-administered medications for this visit. (Ophthalmic Drugs)   Current Outpatient Medications (Other)  Medication Sig  . acetaminophen (TYLENOL) 325 MG tablet Take 2 tablets (650 mg total) by mouth every 4 (four) hours as needed for mild pain (or temp > 37.5 C (99.5 F)).  . DULoxetine (CYMBALTA) 60 MG capsule Take 1 capsule (60 mg total) by mouth daily.  . finasteride (PROSCAR) 5 MG tablet Take 1 tablet (5 mg total) by mouth daily.  . fluocinonide cream (LIDEX) 0.05 % Apply topically 3 (three) times daily. Apply to affected area  . furosemide (LASIX) 20 MG tablet Take 2 tablets (40 mg total) by mouth daily.  Marland Kitchen levothyroxine (SYNTHROID) 25 MCG tablet Take 1 tablet (25 mcg total) by mouth daily before breakfast.  . Maltodextrin-Xanthan Gum (RESOURCE THICKENUP CLEAR) POWD Take 120 g by mouth as needed.  . Multiple  Vitamin (MULTIVITAMIN WITH MINERALS) TABS tablet Take 1 tablet by mouth daily.  . pregabalin (LYRICA) 25 MG capsule TAKE 1 CAPSULE(25 MG) BY MOUTH AT BEDTIME  . primidone (MYSOLINE) 250 MG tablet Take 1 tablet (250 mg total) by mouth 2 (two) times daily.  Marland Kitchen senna (SENOKOT) 8.6 MG tablet Take 1 tablet by mouth daily as needed for constipation.  . tamsulosin (FLOMAX) 0.4 MG CAPS capsule Take 1 capsule (0.4 mg total) by mouth 2 (two) times daily.  Marland Kitchen topiramate (TOPAMAX) 50 MG tablet Take 1 tablet (50 mg total) by mouth 2 (two) times daily.  . traMADol (ULTRAM) 50 MG tablet Take 1 tablet (50 mg total) by mouth every 6 (six) hours as needed for severe pain (if tylenol not effective).  . vitamin B-12 (CYANOCOBALAMIN) 1000 MCG tablet Take 1 tablet (1,000 mcg total) by mouth every other day.  . warfarin (COUMADIN) 5 MG tablet Take 1 to 1.5 tablets daily as directed by Coumadin clinic   No current facility-administered medications for this visit. (Other)      REVIEW OF SYSTEMS:    ALLERGIES Allergies  Allergen Reactions  . Sulfa Antibiotics Other (See Comments)    Weakness, dizziness  . Primidone     Other reaction(s): increased sedation  . Simvastatin     Other reaction(s): arthralgias  and fatigue, severe    PAST MEDICAL HISTORY Past Medical History:  Diagnosis Date  . Arthritis    shoulders and back  . Cancer ([place])    skin cancers  . Chronic low back pain 12/15/2017  . CKD (chronic kidney disease) stage 3, GFR 30-59 ml/min (HCC)   . Coronary artery disease   . Dysrhythmia     Paroxysmal atrial fibrillation; Tachycardia-bradycardia syndrome  . Essential tremor 02/28/2016  . GERD (gastroesophageal reflux disease)   . Hernia   . History of hiatal hernia   . Hyperlipemia   . Hypertension   . Macular degeneration   . Neuromuscular disorder (HCC)    neuropathy  . Paroxysmal atrial fibrillation ([place]) 10/28/2013  . Presence of permanent cardiac pacemaker 12/04/2018  . Stroke ([place])  12/2020  . Tachycardia-bradycardia syndrome ([place]) 09/13/2014  . TIA (transient ischemic attack)    Past Surgical History:  Procedure Laterality Date  . APPENDECTOMY    . EYE SURGERY    . HERNIA REPAIR    . HERNIA REPAIR    . INSERT / REPLACE / REMOVE PACEMAKER  12/04/2018  . IRRIGATION AND DEBRIDEMENT ABSCESS Right 07/24/2020   Procedure: IRRIGATION AND DEBRIDEMENT HEMATOMA;  Surgeon: Coralie Keens, MD;  Location: [facility];  Service: General;  Laterality: Right;  . MASS EXCISION Right 07/20/2020   Procedure: EXCISION OF RIGHT CHEST WALL MASS;  Surgeon: Stark Klein, MD;  Location: [facility];  Service: General;  Laterality: Right;  . PACEMAKER IMPLANT N/A 12/04/2018   Procedure: PACEMAKER IMPLANT;  Surgeon: Evans Lance, MD;  Location: [facility];  Service: Cardiovascular;  Laterality: N/A;    FAMILY HISTORY Family History  Problem Relation Age of Onset  . GI problems Mother   . Other Sister        PAIN ISSUES  . Hearing loss Sister   . Blindness Sister   . Heart attack Neg Hx   . Stroke Neg Hx     SOCIAL HISTORY Social History   Tobacco Use  . Smoking status: Former Smoker    Quit date: 12/30/1962    Years since quitting: 58.4  . Smokeless tobacco: Never Used  Vaping Use  . Vaping Use: Never used  Substance Use Topics  . Alcohol use: No  . Drug use: No         OPHTHALMIC EXAM: Base Eye Exam    Visual Acuity (ETDRS)      Right Left   Dist cc 20/400 20/400   Dist ph cc NI NI   Correction: Glasses       Tonometry (Tonopen, 9:40 AM)      Right Left   Pressure 10 11       Pupils      Pupils Dark Light Shape React APD   Right PERRL 2 1 Round Minimal None   Left PERRL 2 1 Round Minimal None       Visual Fields      Left Right    Full    Restrictions  Partial outer inferior temporal deficiency       Extraocular Movement      Right Left    Full Full       Neuro/Psych    Oriented x3: Yes   Mood/Affect: Normal       Dilation    Both eyes:  1.0% Mydriacyl, 2.5% Phenylephrine @ 9:40 AM        Slit Lamp and Fundus Exam    External Exam  Right Left   External Normal Normal       Slit Lamp Exam      Right Left   Lids/Lashes Normal Normal   Conjunctiva/Sclera White and quiet White and quiet   Cornea Clear,, old PKP graft.   Clear   Anterior Chamber Deep and quiet Deep and quiet   Iris Round and reactive Round and reactive   Lens Posterior chamber intraocular lens, Centered posterior chamber intraocular lens Posterior chamber intraocular lens, Centered posterior chamber intraocular lens   Anterior Vitreous Normal Normal       Fundus Exam      Right Left   Posterior Vitreous Posterior vitreous detachment Posterior vitreous detachment   Disc Normal Normal   C/D Ratio 0.45 0.45   Macula Geographic atrophy, Macular atrophy, Hard drusen, Disciform scar Geographic atrophy, Macular atrophy, Hard drusen, Disciform scar   Vessels Normal Normal   Periphery Normal Normal          IMAGING AND PROCEDURES  Imaging and Procedures for 05/10/21  OCT, Retina - OU - Both Eyes       Right Eye Quality was borderline. Progression has been stable. Findings include subretinal scarring, central retinal atrophy, outer retinal atrophy.   Left Eye Quality was borderline. Progression has been stable. Findings include subretinal scarring, central retinal atrophy, outer retinal atrophy.   Notes No active maculopathy, will observe                ASSESSMENT/PLAN:  Exposure keratopathy, bilateral No significant SPK visible today  Advanced nonexudative age-related macular degeneration of both eyes with subfoveal involvement No active CNVM OU, stable OU  Retinal hemorrhage of right eye This condition resolved not active today      ICD-10-CM   1. Exudative age-related macular degeneration of right eye with inactive choroidal neovascularization (HCC)  H35.3212 OCT, Retina - OU - Both Eyes  2. Exposure keratopathy,  bilateral  H16.213   3. Advanced nonexudative age-related macular degeneration of both eyes with subfoveal involvement  H35.3134   4. Retinal hemorrhage of right eye  H35.61     1.  I do recommend artificial tears for the dry sensation he sometimes experiences  2.  3.  Ophthalmic Meds Ordered this visit:  No orders of the defined types were placed in this encounter.      Return in about 1 year (around 05/10/2022) for DILATE OU, COLOR FP, OCT.  There are no Patient Instructions on file for this visit.   Explained the diagnoses, plan, and follow up with the patient and they expressed understanding.  Patient expressed understanding of the importance of proper follow up care.   Clent Demark Abanoub Hanken M.D. Diseases & Surgery of the Retina and Vitreous [facility] [place] 05/10/21     Abbreviations: M myopia (nearsighted); A astigmatism; H hyperopia (farsighted); P presbyopia; Mrx spectacle prescription;  CTL contact lenses; OD right eye; OS left eye; OU both eyes  XT exotropia; ET esotropia; PEK punctate epithelial keratitis; PEE punctate epithelial erosions; DES dry eye syndrome; MGD meibomian gland dysfunction; ATs artificial tears; PFAT's preservative free artificial tears; [place] nuclear sclerotic cataract; PSC posterior subcapsular cataract; ERM epi-retinal membrane; PVD posterior vitreous detachment; RD retinal detachment; DM diabetes mellitus; DR diabetic retinopathy; NPDR non-proliferative diabetic retinopathy; PDR proliferative diabetic retinopathy; CSME clinically significant macular edema; DME diabetic macular edema; dbh dot blot hemorrhages; CWS cotton wool spot; POAG primary open angle glaucoma; C/D cup-to-disc ratio; HVF humphrey visual field; GVF goldmann visual field; OCT  optical coherence tomography; IOP intraocular pressure; BRVO Branch retinal vein occlusion; CRVO central retinal vein occlusion; CRAO central retinal artery occlusion; BRAO branch retinal artery occlusion;  RT retinal tear; SB scleral buckle; PPV pars plana vitrectomy; VH Vitreous hemorrhage; PRP panretinal laser photocoagulation; IVK intravitreal kenalog; VMT vitreomacular traction; MH Macular hole;  NVD neovascularization of the disc; NVE neovascularization elsewhere; AREDS age related eye disease study; ARMD age related macular degeneration; POAG primary open angle glaucoma; EBMD epithelial/anterior basement membrane dystrophy; ACIOL anterior chamber intraocular lens; IOL intraocular lens; PCIOL posterior chamber intraocular lens; Phaco/IOL phacoemulsification with intraocular lens placement; [place] photorefractive keratectomy; LASIK laser assisted in situ keratomileusis; HTN hypertension; DM diabetes mellitus; COPD chronic obstructive pulmonary disease

## 2021-05-23 DIAGNOSIS — I10 Essential (primary) hypertension: Secondary | ICD-10-CM | POA: Diagnosis not present

## 2021-05-23 DIAGNOSIS — I5032 Chronic diastolic (congestive) heart failure: Secondary | ICD-10-CM | POA: Diagnosis not present

## 2021-05-23 DIAGNOSIS — K219 Gastro-esophageal reflux disease without esophagitis: Secondary | ICD-10-CM | POA: Diagnosis not present

## 2021-05-23 DIAGNOSIS — N183 Chronic kidney disease, stage 3 unspecified: Secondary | ICD-10-CM | POA: Diagnosis not present

## 2021-05-23 DIAGNOSIS — I5030 Unspecified diastolic (congestive) heart failure: Secondary | ICD-10-CM | POA: Diagnosis not present

## 2021-05-23 DIAGNOSIS — E039 Hypothyroidism, unspecified: Secondary | ICD-10-CM | POA: Diagnosis not present

## 2021-05-23 DIAGNOSIS — I2510 Atherosclerotic heart disease of native coronary artery without angina pectoris: Secondary | ICD-10-CM | POA: Diagnosis not present

## 2021-05-23 DIAGNOSIS — N400 Enlarged prostate without lower urinary tract symptoms: Secondary | ICD-10-CM | POA: Diagnosis not present

## 2021-05-23 DIAGNOSIS — E782 Mixed hyperlipidemia: Secondary | ICD-10-CM | POA: Diagnosis not present

## 2021-05-23 DIAGNOSIS — I480 Paroxysmal atrial fibrillation: Secondary | ICD-10-CM | POA: Diagnosis not present

## 2021-05-23 DIAGNOSIS — G459 Transient cerebral ischemic attack, unspecified: Secondary | ICD-10-CM | POA: Diagnosis not present

## 2021-05-23 DIAGNOSIS — F322 Major depressive disorder, single episode, severe without psychotic features: Secondary | ICD-10-CM | POA: Diagnosis not present

## 2021-05-25 ENCOUNTER — Ambulatory Visit (INDEPENDENT_AMBULATORY_CARE_PROVIDER_SITE_OTHER): Payer: Medicare Other

## 2021-05-25 ENCOUNTER — Other Ambulatory Visit: Payer: Self-pay

## 2021-05-25 DIAGNOSIS — I480 Paroxysmal atrial fibrillation: Secondary | ICD-10-CM | POA: Diagnosis not present

## 2021-05-25 LAB — POCT INR: INR: 3.2 — AB (ref 2.0–3.0)

## 2021-05-25 NOTE — Patient Instructions (Signed)
Description   Skip today's dosage of Warfarin, then start taking 1.5 tablets daily except for 1 tablet on Mondays, Wednesdays and Fridays.  Recheck INR in 3 weeks. Call # [phone] if placed on any new medications.

## 2021-05-29 DIAGNOSIS — I5032 Chronic diastolic (congestive) heart failure: Secondary | ICD-10-CM | POA: Diagnosis not present

## 2021-06-07 DIAGNOSIS — R3915 Urgency of urination: Secondary | ICD-10-CM | POA: Diagnosis not present

## 2021-06-07 DIAGNOSIS — N401 Enlarged prostate with lower urinary tract symptoms: Secondary | ICD-10-CM | POA: Diagnosis not present

## 2021-06-07 DIAGNOSIS — N3021 Other chronic cystitis with hematuria: Secondary | ICD-10-CM | POA: Diagnosis not present

## 2021-06-07 DIAGNOSIS — R8279 Other abnormal findings on microbiological examination of urine: Secondary | ICD-10-CM | POA: Diagnosis not present

## 2021-06-08 ENCOUNTER — Encounter: Payer: Self-pay | Admitting: Internal Medicine

## 2021-06-08 ENCOUNTER — Ambulatory Visit (INDEPENDENT_AMBULATORY_CARE_PROVIDER_SITE_OTHER): Payer: Medicare Other | Admitting: Internal Medicine

## 2021-06-08 ENCOUNTER — Other Ambulatory Visit: Payer: Self-pay

## 2021-06-08 VITALS — BP 130/70 | HR 69 | Ht 69.5 in | Wt 205.6 lb

## 2021-06-08 DIAGNOSIS — I495 Sick sinus syndrome: Secondary | ICD-10-CM

## 2021-06-08 DIAGNOSIS — Z950 Presence of cardiac pacemaker: Secondary | ICD-10-CM

## 2021-06-08 DIAGNOSIS — I639 Cerebral infarction, unspecified: Secondary | ICD-10-CM | POA: Diagnosis not present

## 2021-06-08 NOTE — Progress Notes (Signed)
HPI [name] returns today for followup. He has been in the ER with multiple medical problems including atrial fib, chronic diastolic heart failure, right chest wall mass, s/p removal complicated by hematoma. He has a h/o sinus node dysfunction and s/p PPM insertion. He has not had syncope but has fallen and injured his back. He is limited in his activity by severe peripheral neuropathy. His son notes that he more sedentary. Allergies  Allergen Reactions   Sulfa Antibiotics Other (See Comments)    Weakness, dizziness   Primidone     Other reaction(s): increased sedation   Simvastatin     Other reaction(s): arthralgias and fatigue, severe     Current Outpatient Medications  Medication Sig Dispense Refill   acetaminophen (TYLENOL) 325 MG tablet Take 2 tablets (650 mg total) by mouth every 4 (four) hours as needed for mild pain (or temp > 37.5 C (99.5 F)).     DULoxetine (CYMBALTA) 60 MG capsule Take 1 capsule (60 mg total) by mouth daily. 30 capsule 11   finasteride (PROSCAR) 5 MG tablet Take 1 tablet (5 mg total) by mouth daily. 30 tablet 0   fluocinonide cream (LIDEX) 0.05 % Apply topically 3 (three) times daily. Apply to affected area 30 g 0   furosemide (LASIX) 20 MG tablet Take 2 tablets (40 mg total) by mouth daily. 60 tablet 0   levothyroxine (SYNTHROID) 25 MCG tablet Take 1 tablet (25 mcg total) by mouth daily before breakfast. 90 tablet 1   Maltodextrin-Xanthan Gum (RESOURCE THICKENUP CLEAR) POWD Take 120 g by mouth as needed. 125 g 0   Multiple Vitamin (MULTIVITAMIN WITH MINERALS) TABS tablet Take 1 tablet by mouth daily.     pregabalin (LYRICA) 25 MG capsule TAKE 1 CAPSULE(25 MG) BY MOUTH AT BEDTIME 90 capsule 1   primidone (MYSOLINE) 250 MG tablet Take 1 tablet (250 mg total) by mouth 2 (two) times daily. 60 tablet 11   senna (SENOKOT) 8.6 MG tablet Take 1 tablet by mouth daily as needed for constipation.     tamsulosin (FLOMAX) 0.4 MG CAPS capsule Take 1 capsule (0.4  mg total) by mouth 2 (two) times daily. 30 capsule 0   topiramate (TOPAMAX) 50 MG tablet Take 1 tablet (50 mg total) by mouth 2 (two) times daily. 180 tablet 3   traMADol (ULTRAM) 50 MG tablet Take 1 tablet (50 mg total) by mouth every 6 (six) hours as needed for severe pain (if tylenol not effective). 30 tablet 0   vitamin B-12 (CYANOCOBALAMIN) 1000 MCG tablet Take 1 tablet (1,000 mcg total) by mouth every other day. 30 tablet 0   warfarin (COUMADIN) 5 MG tablet Take 1 to 1.5 tablets daily as directed by Coumadin clinic 45 tablet 1   No current facility-administered medications for this visit.     Past Medical History:  Diagnosis Date   Arthritis    shoulders and back   Cancer ([place])    skin cancers   Chronic low back pain 12/15/2017   CKD (chronic kidney disease) stage 3, GFR 30-59 ml/min (HCC)    Coronary artery disease    Dysrhythmia     Paroxysmal atrial fibrillation; Tachycardia-bradycardia syndrome   Essential tremor 02/28/2016   GERD (gastroesophageal reflux disease)    Hernia    History of hiatal hernia    Hyperlipemia    Hypertension    Macular degeneration    Neuromuscular disorder (HCC)    neuropathy   Paroxysmal atrial fibrillation ([place])  10/28/2013   Presence of permanent cardiac pacemaker 12/04/2018   Stroke ([facility]) 12/2020   Tachycardia-bradycardia syndrome ([place] [facility]) 09/13/2014   TIA (transient ischemic attack)     ROS:   All systems reviewed and negative except as noted in the HPI.   Past Surgical History:  Procedure Laterality Date   APPENDECTOMY     EYE SURGERY     HERNIA REPAIR     HERNIA REPAIR     INSERT / REPLACE / REMOVE PACEMAKER  12/04/2018   IRRIGATION AND DEBRIDEMENT ABSCESS Right 07/24/2020   Procedure: IRRIGATION AND DEBRIDEMENT HEMATOMA;  Surgeon: Coralie Keens, MD;  Location: [facility];  Service: General;  Laterality: Right;   MASS EXCISION Right 07/20/2020   Procedure: EXCISION OF RIGHT CHEST WALL MASS;  Surgeon: Stark Klein, MD;  Location: [facility];  Service: General;  Laterality: Right;   PACEMAKER IMPLANT N/A 12/04/2018   Procedure: PACEMAKER IMPLANT;  Surgeon: Evans Lance, MD;  Location: [facility];  Service: Cardiovascular;  Laterality: N/A;     Family History  Problem Relation Age of Onset   GI problems Mother    Other Sister        PAIN ISSUES   Hearing loss Sister    Blindness Sister    Heart attack Neg Hx    Stroke Neg Hx      Social History   Socioeconomic History   Marital status: Widowed    Spouse name: Not on file   Number of children: 1   Years of education: 10   Highest education level: Not on file  Occupational History    Comment: retired  Tobacco Use   Smoking status: Former    Pack years: 0.00    Types: Cigarettes    Quit date: 12/30/1962    Years since quitting: 58.4   Smokeless tobacco: Never  Vaping Use   Vaping Use: Never used  Substance and Sexual Activity   Alcohol use: No   Drug use: No   Sexual activity: Not Currently  Other Topics Concern   Not on file  Social History Narrative   Lives alone   Right-handed   Caffeine use- caffeine free coffee, occas soda   Social Determinants of Health   Financial Resource Strain: Not on file  Food Insecurity: Not on file  Transportation Needs: Not on file  Physical Activity: Not on file  Stress: Not on file  Social Connections: Not on file  Intimate Partner Violence: Not on file     BP 130/70   Pulse 69   Ht 5' 9.5" (1.765 m)   Wt 205 lb 9.6 oz (93.3 kg)   SpO2 94%   BMI 29.93 kg/m   Physical Exam:  Well appearing NAD HEENT: Unremarkable Neck:  No JVD, no thyromegally Lymphatics:  No adenopathy Back:  No CVA tenderness Lungs:  Clear with no wheezes HEART:  Regular rate rhythm, no murmurs, no rubs, no clicks Abd:  soft, positive bowel sounds, no organomegally, no rebound, no guarding Ext:  2 plus pulses, no edema, no cyanosis, no clubbing Skin:  No rashes no nodules Neuro:  CN II through XII intact, motor  grossly intact   DEVICE  Normal device function.  See PaceArt for details.   Assess/Plan:  1. Atrial fib - his rates are controlled. No change in his meds. 2. PPM -his medtronic DDD PM is working normally. 3. Falls - he has not had any. He has been placed back on coumadin after having  a stroke. 4. HTN - his SBP is up a bit but with his h/o falls we do not want to aggressively try to lower too much.   Salome Spotted.

## 2021-06-08 NOTE — Patient Instructions (Signed)
Medication Instructions:  Your physician recommends that you continue on your current medications as directed. Please refer to the Current Medication list given to you today.  Labwork: None ordered.  Testing/Procedures: None ordered.  Follow-Up: Your physician wants you to follow-up in: one year with Gregg Taylor, MD or one of the following Advanced Practice Providers on your designated Care Team:    Amber Seiler, NP  Renee Ursuy, PA-C  Michael "Andy" Tillery, PA-C  Remote monitoring is used to monitor your Pacemaker from home. This monitoring reduces the number of office visits required to check your device to one time per year. It allows us to keep an eye on the functioning of your device to ensure it is working properly. You are scheduled for a device check from home on 07/06/2021. You may send your transmission at any time that day. If you have a wireless device, the transmission will be sent automatically. After your physician reviews your transmission, you will receive a postcard with your next transmission date.  Any Other Special Instructions Will Be Listed Below (If Applicable).  If you need a refill on your cardiac medications before your next appointment, please call your pharmacy.       

## 2021-06-13 ENCOUNTER — Encounter: Payer: Self-pay | Admitting: Podiatry

## 2021-06-13 ENCOUNTER — Ambulatory Visit (INDEPENDENT_AMBULATORY_CARE_PROVIDER_SITE_OTHER): Payer: Medicare Other | Admitting: Podiatry

## 2021-06-13 ENCOUNTER — Other Ambulatory Visit: Payer: Self-pay

## 2021-06-13 DIAGNOSIS — M79676 Pain in unspecified toe(s): Secondary | ICD-10-CM | POA: Diagnosis not present

## 2021-06-13 DIAGNOSIS — D689 Coagulation defect, unspecified: Secondary | ICD-10-CM | POA: Diagnosis not present

## 2021-06-13 DIAGNOSIS — L608 Other nail disorders: Secondary | ICD-10-CM | POA: Diagnosis not present

## 2021-06-13 DIAGNOSIS — D696 Thrombocytopenia, unspecified: Secondary | ICD-10-CM

## 2021-06-13 DIAGNOSIS — B351 Tinea unguium: Secondary | ICD-10-CM

## 2021-06-13 DIAGNOSIS — E1142 Type 2 diabetes mellitus with diabetic polyneuropathy: Secondary | ICD-10-CM | POA: Diagnosis not present

## 2021-06-13 NOTE — Progress Notes (Signed)
This patient returns to my office for at risk foot care.  This patient requires this care by a professional since this patient will be at risk due to having peripheral neuropathy, CKD, Coagulation defect and thrombocytopenia.  This patient is unable to cut nails himself since the patient cannot reach his nails.These nails are painful walking and wearing shoes.  This patient presents for at risk foot care today.  He presents to the office with male son.  He is taking coumadin.    General Appearance  Alert, conversant and in no acute stress.  Vascular  Dorsalis pedis and posterior tibial  pulses are not  palpable  bilaterally.  Capillary return is within normal limits  bilaterally. Temperature is within normal limits  Bilaterally. Swelling feet/legs  Neurologic  Senn-Weinstein monofilament wire test within normal limits  bilaterally. Muscle power within normal limits bilaterally.  Nails Thick disfigured discolored nails with subungual debris  from hallux to fifth toes bilaterally. Pincer nails hallux  B/L. No evidence of bacterial infection or drainage bilaterally.  Orthopedic  No limitations of motion  feet .  No crepitus or effusions noted.  No bony pathology or digital deformities noted.  Skin  normotropic skin with no porokeratosis noted bilaterally.  No signs of infections or ulcers noted.     Onychomycosis  Pain in right toes  Pain in left toes  Consent was obtained for treatment procedures.   Mechanical debridement of nails 1-5  bilaterally performed with a nail nipper.  Filed with dremel without incident. Told him to soak his right foot in soapy sudsy water.   Return office visit  10 weeks                   Told patient to return for periodic foot care and evaluation due to potential at risk complications.   Gardiner Barefoot DPM

## 2021-06-15 ENCOUNTER — Other Ambulatory Visit: Payer: Self-pay

## 2021-06-15 ENCOUNTER — Ambulatory Visit (INDEPENDENT_AMBULATORY_CARE_PROVIDER_SITE_OTHER): Payer: Medicare Other | Admitting: Pharmacist

## 2021-06-15 DIAGNOSIS — Z7901 Long term (current) use of anticoagulants: Secondary | ICD-10-CM | POA: Diagnosis not present

## 2021-06-15 DIAGNOSIS — I480 Paroxysmal atrial fibrillation: Secondary | ICD-10-CM | POA: Diagnosis not present

## 2021-06-15 LAB — POCT INR: INR: 1.9 — AB (ref 2.0–3.0)

## 2021-06-15 NOTE — Patient Instructions (Signed)
Take 1.5 tablets today then continue taking 1.5 tablets daily except for 1 tablet on Mondays, Wednesdays and Fridays.  Recheck INR in 3 weeks. Call # [phone] if placed on any new medications.

## 2021-06-28 DIAGNOSIS — I5032 Chronic diastolic (congestive) heart failure: Secondary | ICD-10-CM | POA: Diagnosis not present

## 2021-07-02 ENCOUNTER — Inpatient Hospital Stay (HOSPITAL_COMMUNITY): Payer: Medicare Other

## 2021-07-02 ENCOUNTER — Encounter (HOSPITAL_COMMUNITY): Payer: Self-pay

## 2021-07-02 ENCOUNTER — Emergency Department (HOSPITAL_COMMUNITY): Payer: Medicare Other

## 2021-07-02 ENCOUNTER — Other Ambulatory Visit: Payer: Self-pay

## 2021-07-02 ENCOUNTER — Inpatient Hospital Stay (HOSPITAL_COMMUNITY)
Admission: EM | Admit: 2021-07-02 | Discharge: 2021-07-07 | DRG: 689 | Disposition: A | Discharge status: Home / Self Care | Payer: Medicare Other | Attending: Family Medicine | Admitting: Family Medicine

## 2021-07-02 DIAGNOSIS — Z882 Allergy status to sulfonamides status: Secondary | ICD-10-CM

## 2021-07-02 DIAGNOSIS — N50819 Testicular pain, unspecified: Secondary | ICD-10-CM | POA: Diagnosis not present

## 2021-07-02 DIAGNOSIS — F39 Unspecified mood [affective] disorder: Secondary | ICD-10-CM | POA: Diagnosis present

## 2021-07-02 DIAGNOSIS — N1832 Chronic kidney disease, stage 3b: Secondary | ICD-10-CM | POA: Diagnosis present

## 2021-07-02 DIAGNOSIS — M19012 Primary osteoarthritis, left shoulder: Secondary | ICD-10-CM | POA: Diagnosis present

## 2021-07-02 DIAGNOSIS — N179 Acute kidney failure, unspecified: Secondary | ICD-10-CM | POA: Diagnosis present

## 2021-07-02 DIAGNOSIS — S3991XA Unspecified injury of abdomen, initial encounter: Secondary | ICD-10-CM | POA: Diagnosis not present

## 2021-07-02 DIAGNOSIS — N50812 Left testicular pain: Secondary | ICD-10-CM | POA: Diagnosis not present

## 2021-07-02 DIAGNOSIS — K219 Gastro-esophageal reflux disease without esophagitis: Secondary | ICD-10-CM | POA: Diagnosis present

## 2021-07-02 DIAGNOSIS — N50811 Right testicular pain: Secondary | ICD-10-CM | POA: Diagnosis not present

## 2021-07-02 DIAGNOSIS — N503 Cyst of epididymis: Secondary | ICD-10-CM | POA: Diagnosis not present

## 2021-07-02 DIAGNOSIS — A419 Sepsis, unspecified organism: Secondary | ICD-10-CM | POA: Diagnosis not present

## 2021-07-02 DIAGNOSIS — Z85828 Personal history of other malignant neoplasm of skin: Secondary | ICD-10-CM

## 2021-07-02 DIAGNOSIS — R531 Weakness: Secondary | ICD-10-CM | POA: Diagnosis not present

## 2021-07-02 DIAGNOSIS — G250 Essential tremor: Secondary | ICD-10-CM | POA: Diagnosis present

## 2021-07-02 DIAGNOSIS — M479 Spondylosis, unspecified: Secondary | ICD-10-CM | POA: Diagnosis present

## 2021-07-02 DIAGNOSIS — I5032 Chronic diastolic (congestive) heart failure: Secondary | ICD-10-CM | POA: Diagnosis not present

## 2021-07-02 DIAGNOSIS — D696 Thrombocytopenia, unspecified: Secondary | ICD-10-CM | POA: Diagnosis present

## 2021-07-02 DIAGNOSIS — S0990XA Unspecified injury of head, initial encounter: Secondary | ICD-10-CM | POA: Diagnosis not present

## 2021-07-02 DIAGNOSIS — R29810 Facial weakness: Secondary | ICD-10-CM | POA: Diagnosis not present

## 2021-07-02 DIAGNOSIS — Z87440 Personal history of urinary (tract) infections: Secondary | ICD-10-CM

## 2021-07-02 DIAGNOSIS — Z79899 Other long term (current) drug therapy: Secondary | ICD-10-CM

## 2021-07-02 DIAGNOSIS — I959 Hypotension, unspecified: Secondary | ICD-10-CM | POA: Diagnosis not present

## 2021-07-02 DIAGNOSIS — G9341 Metabolic encephalopathy: Secondary | ICD-10-CM | POA: Diagnosis present

## 2021-07-02 DIAGNOSIS — S80919A Unspecified superficial injury of unspecified knee, initial encounter: Secondary | ICD-10-CM | POA: Diagnosis not present

## 2021-07-02 DIAGNOSIS — Z8673 Personal history of transient ischemic attack (TIA), and cerebral infarction without residual deficits: Secondary | ICD-10-CM

## 2021-07-02 DIAGNOSIS — Z792 Long term (current) use of antibiotics: Secondary | ICD-10-CM

## 2021-07-02 DIAGNOSIS — M19011 Primary osteoarthritis, right shoulder: Secondary | ICD-10-CM | POA: Diagnosis present

## 2021-07-02 DIAGNOSIS — E039 Hypothyroidism, unspecified: Secondary | ICD-10-CM | POA: Diagnosis present

## 2021-07-02 DIAGNOSIS — I10 Essential (primary) hypertension: Secondary | ICD-10-CM | POA: Diagnosis present

## 2021-07-02 DIAGNOSIS — N3000 Acute cystitis without hematuria: Principal | ICD-10-CM

## 2021-07-02 DIAGNOSIS — N390 Urinary tract infection, site not specified: Secondary | ICD-10-CM | POA: Diagnosis present

## 2021-07-02 DIAGNOSIS — I6523 Occlusion and stenosis of bilateral carotid arteries: Secondary | ICD-10-CM | POA: Diagnosis not present

## 2021-07-02 DIAGNOSIS — N189 Chronic kidney disease, unspecified: Secondary | ICD-10-CM

## 2021-07-02 DIAGNOSIS — N442 Benign cyst of testis: Secondary | ICD-10-CM | POA: Diagnosis not present

## 2021-07-02 DIAGNOSIS — G9389 Other specified disorders of brain: Secondary | ICD-10-CM | POA: Diagnosis not present

## 2021-07-02 DIAGNOSIS — I495 Sick sinus syndrome: Secondary | ICD-10-CM | POA: Diagnosis present

## 2021-07-02 DIAGNOSIS — I2510 Atherosclerotic heart disease of native coronary artery without angina pectoris: Secondary | ICD-10-CM | POA: Diagnosis present

## 2021-07-02 DIAGNOSIS — Z66 Do not resuscitate: Secondary | ICD-10-CM | POA: Diagnosis present

## 2021-07-02 DIAGNOSIS — N400 Enlarged prostate without lower urinary tract symptoms: Secondary | ICD-10-CM | POA: Diagnosis present

## 2021-07-02 DIAGNOSIS — K5909 Other constipation: Secondary | ICD-10-CM | POA: Diagnosis present

## 2021-07-02 DIAGNOSIS — R000 Tachycardia, unspecified: Secondary | ICD-10-CM | POA: Diagnosis not present

## 2021-07-02 DIAGNOSIS — I130 Hypertensive heart and chronic kidney disease with heart failure and stage 1 through stage 4 chronic kidney disease, or unspecified chronic kidney disease: Secondary | ICD-10-CM | POA: Diagnosis present

## 2021-07-02 DIAGNOSIS — Z7901 Long term (current) use of anticoagulants: Secondary | ICD-10-CM

## 2021-07-02 DIAGNOSIS — I480 Paroxysmal atrial fibrillation: Secondary | ICD-10-CM | POA: Diagnosis present

## 2021-07-02 DIAGNOSIS — Z79890 Hormone replacement therapy (postmenopausal): Secondary | ICD-10-CM

## 2021-07-02 DIAGNOSIS — Z20822 Contact with and (suspected) exposure to covid-19: Secondary | ICD-10-CM | POA: Diagnosis present

## 2021-07-02 DIAGNOSIS — E785 Hyperlipidemia, unspecified: Secondary | ICD-10-CM | POA: Diagnosis present

## 2021-07-02 DIAGNOSIS — Z1612 Extended spectrum beta lactamase (ESBL) resistance: Secondary | ICD-10-CM | POA: Diagnosis present

## 2021-07-02 DIAGNOSIS — K5730 Diverticulosis of large intestine without perforation or abscess without bleeding: Secondary | ICD-10-CM | POA: Diagnosis not present

## 2021-07-02 DIAGNOSIS — B9620 Unspecified Escherichia coli [E. coli] as the cause of diseases classified elsewhere: Secondary | ICD-10-CM | POA: Diagnosis present

## 2021-07-02 DIAGNOSIS — R0902 Hypoxemia: Secondary | ICD-10-CM | POA: Diagnosis not present

## 2021-07-02 DIAGNOSIS — Z888 Allergy status to other drugs, medicaments and biological substances status: Secondary | ICD-10-CM

## 2021-07-02 DIAGNOSIS — G629 Polyneuropathy, unspecified: Secondary | ICD-10-CM | POA: Diagnosis present

## 2021-07-02 DIAGNOSIS — H3530 Unspecified macular degeneration: Secondary | ICD-10-CM | POA: Diagnosis present

## 2021-07-02 DIAGNOSIS — N281 Cyst of kidney, acquired: Secondary | ICD-10-CM | POA: Diagnosis not present

## 2021-07-02 DIAGNOSIS — Z950 Presence of cardiac pacemaker: Secondary | ICD-10-CM

## 2021-07-02 LAB — CBC
HCT: 34.3 % — ABNORMAL LOW (ref 39.0–52.0)
Hemoglobin: 11.2 g/dL — ABNORMAL LOW (ref 13.0–17.0)
MCH: 32.7 pg (ref 26.0–34.0)
MCHC: 32.7 g/dL (ref 30.0–36.0)
MCV: 100 fL (ref 80.0–100.0)
Platelets: 100 10*3/uL — ABNORMAL LOW (ref 150–400)
RBC: 3.43 MIL/uL — ABNORMAL LOW (ref 4.22–5.81)
RDW: 15.8 % — ABNORMAL HIGH (ref 11.5–15.5)
WBC: 10.9 10*3/uL — ABNORMAL HIGH (ref 4.0–10.5)
nRBC: 0 % (ref 0.0–0.2)

## 2021-07-02 LAB — BASIC METABOLIC PANEL
Anion gap: 7 (ref 5–15)
BUN: 26 mg/dL — ABNORMAL HIGH (ref 8–23)
CO2: 28 mmol/L (ref 22–32)
Calcium: 9 mg/dL (ref 8.9–10.3)
Chloride: 102 mmol/L (ref 98–111)
Creatinine, Ser: 1.68 mg/dL — ABNORMAL HIGH (ref 0.61–1.24)
GFR, Estimated: 38 mL/min — ABNORMAL LOW (ref >60)
Glucose, Bld: 107 mg/dL — ABNORMAL HIGH (ref 70–99)
Potassium: 4.3 mmol/L (ref 3.5–5.1)
Sodium: 137 mmol/L (ref 135–145)

## 2021-07-02 LAB — URINALYSIS, ROUTINE W REFLEX MICROSCOPIC
Bilirubin Urine: NEGATIVE
Glucose, UA: NEGATIVE mg/dL
Ketones, ur: NEGATIVE mg/dL
Nitrite: NEGATIVE
Protein, ur: NEGATIVE mg/dL
Specific Gravity, Urine: 1.018 (ref 1.005–1.030)
WBC, UA: >50 WBC/hpf — ABNORMAL HIGH (ref 0–5)
pH: 6 (ref 5.0–8.0)

## 2021-07-02 LAB — APTT: aPTT: 47 seconds — ABNORMAL HIGH (ref 24–36)

## 2021-07-02 LAB — RESP PANEL BY RT-PCR (FLU A&B, COVID) ARPGX2
Influenza A by PCR: NEGATIVE
Influenza B by PCR: NEGATIVE
SARS Coronavirus 2 by RT PCR: NEGATIVE

## 2021-07-02 LAB — LACTIC ACID, PLASMA: Lactic Acid, Venous: 1.1 mmol/L (ref 0.5–1.9)

## 2021-07-02 LAB — PROTIME-INR
INR: 1.7 — ABNORMAL HIGH (ref 0.8–1.2)
Prothrombin Time: 20.1 seconds — ABNORMAL HIGH (ref 11.4–15.2)

## 2021-07-02 MED ORDER — POLYETHYLENE GLYCOL 3350 17 G PO PACK
17.0000 g | PACK | Freq: Every day | ORAL | Status: DC
  Administered 2021-07-03 – 2021-07-07 (×5): 17 g via ORAL
  Filled 2021-07-02 (×5): qty 1

## 2021-07-02 MED ORDER — ACETAMINOPHEN 500 MG PO TABS
1000.0000 mg | ORAL_TABLET | Freq: Once | ORAL | Status: AC
  Administered 2021-07-02: 1000 mg via ORAL
  Filled 2021-07-02: qty 2

## 2021-07-02 MED ORDER — SENNA 8.6 MG PO TABS: 1.0000 | ORAL_TABLET | Freq: Every day | ORAL | Status: DC | PRN

## 2021-07-02 MED ORDER — WARFARIN SODIUM 7.5 MG PO TABS
7.5000 mg | ORAL_TABLET | Freq: Once | ORAL | Status: AC
  Administered 2021-07-02: 7.5 mg via ORAL
  Filled 2021-07-02: qty 1

## 2021-07-02 MED ORDER — SODIUM CHLORIDE 0.9 % IV SOLN
1.0000 g | Freq: Once | INTRAVENOUS | Status: AC
  Administered 2021-07-02: 1 g via INTRAVENOUS
  Filled 2021-07-02: qty 10

## 2021-07-02 MED ORDER — SODIUM CHLORIDE 0.9 % IV SOLN
1.0000 g | Freq: Two times a day (BID) | INTRAVENOUS | Status: DC
  Administered 2021-07-02 – 2021-07-04 (×4): 1 g via INTRAVENOUS
  Filled 2021-07-02 (×6): qty 1

## 2021-07-02 MED ORDER — IOHEXOL 300 MG/ML  SOLN
70.0000 mL | Freq: Once | INTRAMUSCULAR | Status: AC | PRN
  Administered 2021-07-02: 70 mL via INTRAVENOUS

## 2021-07-02 MED ORDER — ADULT MULTIVITAMIN W/MINERALS CH
1.0000 | ORAL_TABLET | Freq: Every day | ORAL | Status: DC
  Administered 2021-07-03 – 2021-07-07 (×5): 1 via ORAL
  Filled 2021-07-02 (×5): qty 1

## 2021-07-02 MED ORDER — DULOXETINE HCL 20 MG PO CPEP
20.0000 mg | ORAL_CAPSULE | Freq: Every day | ORAL | Status: DC
  Administered 2021-07-02 – 2021-07-07 (×6): 20 mg via ORAL
  Filled 2021-07-02 (×6): qty 1

## 2021-07-02 MED ORDER — ACETAMINOPHEN 325 MG PO TABS
650.0000 mg | ORAL_TABLET | ORAL | Status: DC | PRN
  Administered 2021-07-02 – 2021-07-05 (×5): 650 mg via ORAL
  Filled 2021-07-02 (×5): qty 2

## 2021-07-02 MED ORDER — TETANUS-DIPHTH-ACELL PERTUSSIS 5-2.5-18.5 LF-MCG/0.5 IM SUSY: 0.5000 mL | PREFILLED_SYRINGE | Freq: Once | INTRAMUSCULAR | Status: DC

## 2021-07-02 MED ORDER — TAMSULOSIN HCL 0.4 MG PO CAPS
0.4000 mg | ORAL_CAPSULE | Freq: Two times a day (BID) | ORAL | Status: DC
  Administered 2021-07-02 – 2021-07-07 (×10): 0.4 mg via ORAL
  Filled 2021-07-02 (×10): qty 1

## 2021-07-02 MED ORDER — SODIUM CHLORIDE 0.9 % IV BOLUS
500.0000 mL | Freq: Once | INTRAVENOUS | Status: AC
  Administered 2021-07-02: 500 mL via INTRAVENOUS

## 2021-07-02 MED ORDER — PRIMIDONE 250 MG PO TABS
250.0000 mg | ORAL_TABLET | Freq: Two times a day (BID) | ORAL | Status: DC
  Administered 2021-07-03 – 2021-07-07 (×9): 250 mg via ORAL
  Filled 2021-07-02 (×11): qty 1

## 2021-07-02 MED ORDER — WARFARIN - PHARMACIST DOSING INPATIENT: Freq: Every day | Status: DC

## 2021-07-02 MED ORDER — SODIUM CHLORIDE 0.9 % IV SOLN: INTRAVENOUS | Status: AC

## 2021-07-02 MED ORDER — FUROSEMIDE 40 MG PO TABS
40.0000 mg | ORAL_TABLET | Freq: Every day | ORAL | Status: DC
  Administered 2021-07-02: 40 mg via ORAL
  Filled 2021-07-02: qty 2

## 2021-07-02 MED ORDER — LEVOTHYROXINE SODIUM 25 MCG PO TABS
25.0000 ug | ORAL_TABLET | Freq: Every day | ORAL | Status: DC
  Administered 2021-07-03 – 2021-07-07 (×5): 25 ug via ORAL
  Filled 2021-07-02 (×5): qty 1

## 2021-07-02 MED ORDER — TOPIRAMATE 25 MG PO TABS
50.0000 mg | ORAL_TABLET | Freq: Two times a day (BID) | ORAL | Status: DC
  Administered 2021-07-03 – 2021-07-07 (×9): 50 mg via ORAL
  Filled 2021-07-02 (×10): qty 2

## 2021-07-02 MED ORDER — FINASTERIDE 5 MG PO TABS
5.0000 mg | ORAL_TABLET | Freq: Every day | ORAL | Status: DC
  Administered 2021-07-03 – 2021-07-07 (×6): 5 mg via ORAL
  Filled 2021-07-02 (×7): qty 1

## 2021-07-02 MED ORDER — ENOXAPARIN SODIUM 40 MG/0.4ML IJ SOSY: 40.0000 mg | PREFILLED_SYRINGE | INTRAMUSCULAR | Status: DC

## 2021-07-02 MED ORDER — TRAMADOL HCL 50 MG PO TABS
50.0000 mg | ORAL_TABLET | Freq: Four times a day (QID) | ORAL | Status: DC | PRN
Start: 2021-07-02 — End: 2021-07-07
  Administered 2021-07-07: 50 mg via ORAL
  Filled 2021-07-02: qty 1

## 2021-07-02 MED ORDER — VITAMIN B-12 1000 MCG PO TABS
1000.0000 ug | ORAL_TABLET | ORAL | Status: DC
  Administered 2021-07-02 – 2021-07-06 (×3): 1000 ug via ORAL
  Filled 2021-07-02 (×5): qty 1

## 2021-07-02 MED ORDER — PREGABALIN 25 MG PO CAPS
25.0000 mg | ORAL_CAPSULE | Freq: Every day | ORAL | Status: DC
  Administered 2021-07-02 – 2021-07-06 (×5): 25 mg via ORAL
  Filled 2021-07-02 (×5): qty 1

## 2021-07-02 NOTE — ED Triage Notes (Signed)
Patient arrived from home by [facility]. Patient complains of chills and weakness with cough x 3 days. This am fell due to weakness and skin tear to left upper arm. On thinners but no head trauma. Denies pain. Alert and oriented, fine tremor noted

## 2021-07-02 NOTE — Progress Notes (Addendum)
ANTICOAGULATION/ANTIBIOTIC CONSULT NOTE - Initial Consult  Pharmacy Consult for Coumadin and Meropenem Indication:  afib, stroke  and ESBL UTI  Allergies  Allergen Reactions   Sulfa Antibiotics Other (See Comments)    Weakness, dizziness   Primidone     Other reaction(s): increased sedation   Simvastatin     Other reaction(s): arthralgias and fatigue, severe    Patient Measurements:    Vital Signs: Temp: 100.4 F (38 C) (07/04 1222) Temp Source: Rectal (07/04 1222) BP: 119/60 (07/04 1445) Pulse Rate: 70 (07/04 1445)  Labs: Recent Labs    07/02/21 0902 07/02/21 1420  HGB 11.2*  --   HCT 34.3*  --   PLT 100*  --   APTT  --  47*  LABPROT 20.1*  --   INR 1.7*  --   CREATININE 1.68*  --     CrCl cannot be calculated (Unknown ideal weight.).   Medical History: Past Medical History:  Diagnosis Date   Arthritis    shoulders and back   Cancer ([place])    skin cancers   Chronic low back pain 12/15/2017   CKD (chronic kidney disease) stage 3, GFR 30-59 ml/min (HCC)    Coronary artery disease    Dysrhythmia     Paroxysmal atrial fibrillation; Tachycardia-bradycardia syndrome   Essential tremor 02/28/2016   GERD (gastroesophageal reflux disease)    Hernia    History of hiatal hernia    Hyperlipemia    Hypertension    Macular degeneration    Neuromuscular disorder (HCC)    neuropathy   Paroxysmal atrial fibrillation ([place]) 10/28/2013   Presence of permanent cardiac pacemaker 12/04/2018   Stroke ([place]) 12/2020   Tachycardia-bradycardia syndrome ([place]) 09/13/2014   TIA (transient ischemic attack)     Medications:  Awaiting home med rec  Assessment: 87 y.o. M presents s/p fall. He has a skin tear. Head CT negative for bleed.   AC: Pt on warfarin PTA for afib, h/o stroke. Admission INR 1.7. Hgb 11.2, plt 100. Home dose: 5mg  Mon/Wed/Fri and 7.5mg  all other days MD ordered Lovenox for VTE prophylaxis while INR <2.  ID: Meropenem for UTI - h/o recurrent ESBL  infection  7/4 Meropenem >>  7/4 BCx: 7/4 UCx:  Goal of Therapy:  INR 2-3 Monitor platelets by anticoagulation protocol: Yes   Plan:  Coumadin 7.5mg  today Daily PT/INR D/c Lovenox when INR >/= 2 Meropenem 1 gm IV q12h F/u SCr, micro data, and pt's clinical condition  Sherlon Handing, PharmD, BCPS Please see amion for complete clinical pharmacist phone list 07/02/2021,3:08 PM

## 2021-07-02 NOTE — ED Provider Notes (Signed)
[facility] EMERGENCY DEPARTMENT Provider Note   CSN: [id number] Arrival date & time: 07/02/21  0844     History No chief complaint on file.   Douglas Walker is a 87 y.o. male past med history of CKD, essential tremor, paroxysmal A. fib, TIA who presents for evaluation of weakness.  Patient reports that he was at home and states he was try to get up out of bed today and was very weak.  He states he fell.  He is unclear of the exact details of him falling.  He states that he thinks he fell against a wall on his left side.  He did sustain a skin tear.  He states he has felt weak and had chills for the last few days.  He does not think he hit his head.  He is on blood thinners for A. fib.  He reports some abdominal pain.  Denies any chest pain.  He states occasionally, he feels like he has trouble breathing.  Denies any nausea/vomiting.  Difficulty obtaining history secondary to [facility].  The history is provided by the patient.      Past Medical History:  Diagnosis Date   Arthritis    shoulders and back   Cancer ([place])    skin cancers   Chronic low back pain 12/15/2017   CKD (chronic kidney disease) stage 3, GFR 30-59 ml/min (HCC)    Coronary artery disease    Dysrhythmia     Paroxysmal atrial fibrillation; Tachycardia-bradycardia syndrome   Essential tremor 02/28/2016   GERD (gastroesophageal reflux disease)    Hernia    History of hiatal hernia    Hyperlipemia    Hypertension    Macular degeneration    Neuromuscular disorder (HCC)    neuropathy   Paroxysmal atrial fibrillation ([place]) 10/28/2013   Presence of permanent cardiac pacemaker 12/04/2018   Stroke ([place]) 12/2020   Tachycardia-bradycardia syndrome ([place]) 09/13/2014   TIA (transient ischemic attack)     Patient Active Problem List   Diagnosis Date Noted   UTI (urinary tract infection) 07/02/2021   Pain in both testicles    Right hemiparesis ([place]) 04/23/2021   Cerebrovascular accident (CVA) due to  embolism of left middle cerebral artery ([place]) 04/23/2021   Neuropathy 04/23/2021   Abnormality of gait 04/23/2021   Abnormal results of thyroid function studies 04/04/2021   Acquired iron deficiency anemia due to decreased absorption 04/04/2021   Acquired thrombophilia ([place]) 04/04/2021   Cerebral atherosclerosis 04/04/2021   Basal cell carcinoma of nose 04/04/2021   Benign hypertensive heart and renal disease, with heart and renal failure ([place]) 04/04/2021   Benign prostatic hyperplasia 04/04/2021   Obesity 04/04/2021   Bradycardia 04/04/2021   Closed fracture of one rib 04/04/2021   Constipation by delayed colonic transit 04/04/2021   Fatigue 04/04/2021   Gastro-esophageal reflux disease without esophagitis 04/04/2021   Hypothyroidism 04/04/2021   Left lower quadrant pain 04/04/2021   Lumbar radiculopathy 04/04/2021   Reactive depression 04/04/2021   Malignant neoplasm of anterior wall of urinary bladder ([place]) 04/04/2021   Mass of chest wall 04/04/2021   Mixed hyperlipidemia 04/04/2021   Nocturia 04/04/2021   Other shoulder lesions, right shoulder 04/04/2021   Pain in limb 04/04/2021   Peripheral edema 04/04/2021   Personal history of malignant neoplasm of bladder 04/04/2021   Pruritus of genitalia 04/04/2021   Tremor 04/04/2021   Unilateral primary osteoarthritis, left knee 04/04/2021   Unspecified mononeuropathy of unspecified lower limb 04/04/2021   Urinary incontinence  04/04/2021   Dysphagia, post-stroke    Chronic diastolic congestive heart failure (HCC)    Chronic pain syndrome    Left middle cerebral artery stroke ([place]) 02/01/2021   Acute CVA (cerebrovascular accident) ([place]) 01/28/2021   Pacemaker 08/30/2020   Hematoma, chest wall 07/24/2020   Exudative age-related macular degeneration of right eye with inactive choroidal neovascularization ([place]) 05/10/2020   Exudative age-related macular degeneration of left eye with inactive choroidal neovascularization ([place])  05/10/2020   Retinal hemorrhage of right eye 05/10/2020   Exposure keratopathy, bilateral 05/10/2020   Bilateral dry eyes 05/10/2020   Advanced nonexudative age-related macular degeneration of both eyes with subfoveal involvement 05/10/2020   Sick sinus syndrome ([place]) 12/06/2018   Complete heart block ([place]) 12/04/2018   Chronic low back pain 12/15/2017   Hereditary and idiopathic peripheral neuropathy 07/01/2016   Essential tremor 02/28/2016   BPPV (benign paroxysmal positional vertigo) 06/19/2015   Dizziness 06/18/2015   Bacteria in urine 06/18/2015   CKD (chronic kidney disease) stage 3, GFR 30-59 ml/min: Probable 06/18/2015   Chronic diastolic CHF (congestive heart failure) ([place]) 05/30/2015   Fall    Syncope 05/19/2015   Lower urinary tract infectious disease 05/19/2015   Hypotension 05/19/2015   Dehydration 05/19/2015   Fracture of rib of left side 05/19/2015   Syncope and collapse 05/19/2015   Mobitz type II atrioventricular block 09/13/2014   Essential hypertension 09/13/2014   Chronic anticoagulation 09/13/2014   Tachycardia-bradycardia syndrome ([place]) 09/13/2014   Paroxysmal atrial fibrillation ([place]) 10/28/2013   Thrombocytopenia ([place]) 04/06/2013    Past Surgical History:  Procedure Laterality Date   APPENDECTOMY     EYE SURGERY     HERNIA REPAIR     HERNIA REPAIR     INSERT / REPLACE / REMOVE PACEMAKER  12/04/2018   IRRIGATION AND DEBRIDEMENT ABSCESS Right 07/24/2020   Procedure: IRRIGATION AND DEBRIDEMENT HEMATOMA;  Surgeon: Coralie Keens, MD;  Location: [facility];  Service: General;  Laterality: Right;   MASS EXCISION Right 07/20/2020   Procedure: EXCISION OF RIGHT CHEST WALL MASS;  Surgeon: Stark Klein, MD;  Location: [facility];  Service: General;  Laterality: Right;   PACEMAKER IMPLANT N/A 12/04/2018   Procedure: PACEMAKER IMPLANT;  Surgeon: Evans Lance, MD;  Location: [facility];  Service: Cardiovascular;  Laterality: N/A;       Family History   Problem Relation Age of Onset   GI problems Mother    Other Sister        PAIN ISSUES   Hearing loss Sister    Blindness Sister    Heart attack Neg Hx    Stroke Neg Hx     Social History   Tobacco Use   Smoking status: Former    Pack years: 0.00    Types: Cigarettes    Quit date: 12/30/1962    Years since quitting: 58.5   Smokeless tobacco: Never  Vaping Use   Vaping Use: Never used  Substance Use Topics   Alcohol use: No   Drug use: No    Home Medications Prior to Admission medications   Medication Sig Start Date End Date Taking? Authorizing Provider  acetaminophen (TYLENOL) 325 MG tablet Take 2 tablets (650 mg total) by mouth every 4 (four) hours as needed for mild pain (or temp > 37.5 C (99.5 F)). 02/14/21  Yes Angiulli, Lavon Paganini, PA-C  DULoxetine (CYMBALTA) 60 MG capsule Take 1 capsule (60 mg total) by mouth daily. 04/23/21  Yes Lovorn, Jinny Blossom, MD  finasteride (PROSCAR)  5 MG tablet Take 1 tablet (5 mg total) by mouth daily. 02/14/21  Yes Angiulli, Lavon Paganini, PA-C  furosemide (LASIX) 20 MG tablet Take 2 tablets (40 mg total) by mouth daily. Patient taking differently: Take 60 mg by mouth daily. 02/14/21  Yes Angiulli, Lavon Paganini, PA-C  levothyroxine (SYNTHROID) 25 MCG tablet Take 1 tablet (25 mcg total) by mouth daily before breakfast. 02/14/21  Yes Angiulli, Lavon Paganini, PA-C  loratadine (CLARITIN) 10 MG tablet Take 10 mg by mouth daily.   Yes [provider]  Multiple Vitamin (MULTIVITAMIN WITH MINERALS) TABS tablet Take 1 tablet by mouth daily.   Yes [provider]  Multiple Vitamins-Minerals (OCUVITE ADULT 50+) CAPS Take 1 capsule by mouth daily.   Yes [provider]  nitrofurantoin, macrocrystal-monohydrate, (MACROBID) 100 MG capsule Take 100 mg by mouth daily. 06/07/21  Yes [provider]  pregabalin (LYRICA) 25 MG capsule TAKE 1 CAPSULE(25 MG) BY MOUTH AT BEDTIME 02/14/21  Yes Angiulli, Lavon Paganini, PA-C  primidone (MYSOLINE) 250 MG tablet Take 1  tablet (250 mg total) by mouth 2 (two) times daily. 02/14/21  Yes Angiulli, Lavon Paganini, PA-C  tamsulosin (FLOMAX) 0.4 MG CAPS capsule Take 1 capsule (0.4 mg total) by mouth 2 (two) times daily. 02/14/21  Yes Angiulli, Lavon Paganini, PA-C  topiramate (TOPAMAX) 50 MG tablet Take 1 tablet (50 mg total) by mouth 2 (two) times daily. 02/14/21  Yes Angiulli, Lavon Paganini, PA-C  vitamin B-12 (CYANOCOBALAMIN) 1000 MCG tablet Take 1 tablet (1,000 mcg total) by mouth every other day. 02/14/21  Yes Angiulli, Lavon Paganini, PA-C  warfarin (COUMADIN) 5 MG tablet Take 1 to 1.5 tablets daily as directed by Coumadin clinic 05/01/21  Yes Belva Crome, MD  DULoxetine (CYMBALTA) 20 MG capsule Take by mouth. 06/11/21   [provider]  fluocinonide cream (LIDEX) 0.05 % Apply topically 3 (three) times daily. Apply to affected area 02/14/21   Angiulli, Lavon Paganini, PA-C  Maltodextrin-Xanthan Gum (RESOURCE THICKENUP CLEAR) POWD Take 120 g by mouth as needed. 02/14/21   Angiulli, Lavon Paganini, PA-C  senna (SENOKOT) 8.6 MG tablet Take 1 tablet by mouth daily as needed for constipation.    [provider]  traMADol (ULTRAM) 50 MG tablet Take 1 tablet (50 mg total) by mouth every 6 (six) hours as needed for severe pain (if tylenol not effective). 02/14/21   Angiulli, Lavon Paganini, PA-C    Allergies    Sulfa antibiotics, Primidone, and Simvastatin  Review of Systems   Review of Systems  Constitutional:  Positive for chills. Negative for fever.  Respiratory:  Negative for cough and shortness of breath.   Cardiovascular:  Negative for chest pain.  Gastrointestinal:  Positive for abdominal pain. Negative for nausea and vomiting.  Genitourinary:  Negative for dysuria and hematuria.  Neurological:  Positive for weakness (generalized). Negative for headaches.  All other systems reviewed and are negative.  Physical Exam Updated Vital Signs BP 119/60   Pulse 70   Temp (!) 100.4 F (38 C) (Rectal)   Resp 18   Wt 93 kg   SpO2 98%   BMI  29.84 kg/m   Physical Exam Vitals and nursing note reviewed.  Constitutional:      Appearance: Normal appearance. He is well-developed.  HENT:     Head: Normocephalic and atraumatic.  Eyes:     General: Lids are normal.     Conjunctiva/sclera: Conjunctivae normal.     Pupils: Pupils are equal, round, and reactive to light.  Cardiovascular:     Rate and Rhythm: Normal rate and regular rhythm.     Pulses: Normal pulses.     Heart sounds: Normal heart sounds. No murmur heard.   No friction rub. No gallop.  Pulmonary:     Effort: Pulmonary effort is normal.     Breath sounds: Normal breath sounds.     Comments: Lungs clear to auscultation bilaterally.  Symmetric chest rise.  No wheezing, rales, rhonchi. Abdominal:     Palpations: Abdomen is soft. Abdomen is not rigid.     Tenderness: There is generalized abdominal tenderness. There is no guarding.     Comments: Generalized tenderness noted diffusely.  No rigidity, guarding.  Musculoskeletal:        General: Normal range of motion.     Cervical back: Full passive range of motion without pain.  Skin:    General: Skin is warm and dry.     Capillary Refill: Capillary refill takes less than 2 seconds.  Neurological:     Mental Status: He is alert.     Comments: Follows some commands MAE 5/5 strength of BUE and BLE.  CN III-XII without any difficulty.  No slurred speech. No facial dropp.   Psychiatric:        Speech: Speech normal.    ED Results / Procedures / Treatments   Labs (all labs ordered are listed, but only abnormal results are displayed) Labs Reviewed  BASIC METABOLIC PANEL - Abnormal; Notable for the following components:      Result Value   Glucose, Bld 107 (*)    BUN 26 (*)    Creatinine, Ser 1.68 (*)    GFR, Estimated 38 (*)    All other components within normal limits  CBC - Abnormal; Notable for the following components:   WBC 10.9 (*)    RBC 3.43 (*)    Hemoglobin 11.2 (*)    HCT 34.3 (*)    RDW 15.8  (*)    Platelets 100 (*)    All other components within normal limits  PROTIME-INR - Abnormal; Notable for the following components:   Prothrombin Time 20.1 (*)    INR 1.7 (*)    All other components within normal limits  URINALYSIS, ROUTINE W REFLEX MICROSCOPIC - Abnormal; Notable for the following components:   Color, Urine AMBER (*)    APPearance CLOUDY (*)    Hgb urine dipstick SMALL (*)    Leukocytes,Ua LARGE (*)    WBC, UA >50 (*)    Bacteria, UA MANY (*)    All other components within normal limits  APTT - Abnormal; Notable for the following components:   aPTT 47 (*)    All other components within normal limits  RESP PANEL BY RT-PCR (FLU A&B, COVID) [id number]  CULTURE, BLOOD (ROUTINE X 2)  CULTURE, BLOOD (ROUTINE X 2)  URINE CULTURE  LACTIC ACID, PLASMA    EKG EKG Interpretation  Date/Time:  Monday July 02 2021 08:52:42 EDT Ventricular Rate:  70 PR Interval:  26 QRS Duration: 120 QT Interval:  398 QTC Calculation: 429 R Axis:   -24 Text Interpretation: V paced rhythm Abnormal ECG No sig change fromr prior ecg Jan 28 2021 Confirmed by Octaviano Glow [phone]) on 07/02/2021 10:04:55 AM  Radiology DG Chest 2 View  Result Date: 07/02/2021 CLINICAL DATA:  Weakness. EXAM: CHEST - 2 VIEW COMPARISON:  01/28/2021. FINDINGS: Cardiac silhouette is borderline enlarged. No mediastinal or hilar masses. No evidence of adenopathy. There are opacities in  the lung bases, similar to the prior exams, consistent with atelectasis/scarring. Remainder of the lungs is clear. No pleural effusion or pneumothorax. Left anterior chest wall sequential pacemaker is stable. Skeletal structures are demineralized, grossly intact. IMPRESSION: No acute cardiopulmonary disease. Electronically Signed   By: Lajean Manes M.D.   On: 07/02/2021 09:33   CT Head Wo Contrast  Result Date: 07/02/2021 CLINICAL DATA:  Head trauma.  Weakness. EXAM: CT HEAD WITHOUT CONTRAST TECHNIQUE: Contiguous axial images were obtained  from the base of the skull through the vertex without intravenous contrast. COMPARISON:  01/28/2021 FINDINGS: Brain: Left frontal lobe encephalomalacia in the MCA distribution corresponding to the geographic region of involvement shown on perfusion brain study of 01/28/2021. Periventricular white matter and corona radiata hypodensities favor chronic ischemic microvascular white matter disease. Otherwise, the brainstem, cerebellum, cerebral peduncles, thalamus, basal ganglia, basilar cisterns, and ventricular system appear within normal limits. No intracranial hemorrhage, mass lesion, or acute CVA. Vascular: There is atherosclerotic calcification of the cavernous carotid arteries bilaterally. Skull: Unremarkable Sinuses/Orbits: Unremarkable Other: No supplemental non-categorized findings. IMPRESSION: 1. No acute intracranial findings. 2. Encephalomalacia in the left MCA distribution corresponding to the region of involvement shown on perfusion brain study of 01/28/2021. 3. Periventricular white matter and corona radiata hypodensities favor chronic ischemic microvascular white matter disease. Electronically Signed   By: Van Clines M.D.   On: 07/02/2021 11:58   CT ABDOMEN PELVIS W CONTRAST  Result Date: 07/02/2021 CLINICAL DATA:  Abdominal trauma, weakness. EXAM: CT ABDOMEN AND PELVIS WITH CONTRAST TECHNIQUE: Multidetector CT imaging of the abdomen and pelvis was performed using the standard protocol following bolus administration of intravenous contrast. CONTRAST:  87mL OMNIPAQUE IOHEXOL 300 MG/ML  SOLN COMPARISON:  05/01/2021 FINDINGS: Despite efforts by the technologist and patient, motion artifact is present on today's exam and could not be eliminated. This reduces exam sensitivity and specificity. Lower chest: Dependent subsegmental atelectasis in both lower lobes. Aortic and coronary atherosclerosis. Dual lead pacer in place. Mild cardiomegaly. Mild gynecomastia. Hepatobiliary: Small hypodense lesions  in the liver are technically nonspecific although unchanged and statistically likely to be benign cysts or similar benign lesions. Punctate calcifications in the liver compatible with old granulomatous disease. No biliary dilatation. Gallbladder unremarkable. Pancreas: Unremarkable Spleen: Unremarkable Adrenals/Urinary Tract: Both adrenal glands appear normal. 0.9 by 0.7 cm hypodense lesion medially in the right kidney upper pole is similar to the prior exam probably a cyst but technically too small to characterize. 3.0 by 2.5 cm fluid density lesion in the left mid kidney posteriorly compatible with cyst. Small suspected cysts of the right kidney lower pole noted. Multiple bladder cellules are present. Mild urinary bladder wall thickening may be from nondistention although cystitis is not excluded and correlation with urine analysis is suggested. Stomach/Bowel: Scattered diverticula of the terminal ileum. Sigmoid colon diverticulosis without active diverticulitis identified. Vascular/Lymphatic: Aortoiliac atherosclerotic vascular disease. Reproductive: Dystrophic calcifications in the prostate gland. Other: No supplemental non-categorized findings. Musculoskeletal: Mild grade 1 degenerative anterolisthesis at L3-4. Thoracolumbar spondylosis and degenerative disc disease causing multilevel impingement. IMPRESSION: 1. No acute posttraumatic findings in the abdomen. 2. Bladder cellules with mild wall thickening of the urinary bladder, cellulitis not excluded. 3. Other imaging findings of potential clinical significance: Aortic Atherosclerosis (ICD10-I70.0). Mild cardiomegaly. Dual lead pacer. Hepatic and renal cysts. Sigmoid colon diverticulosis without active diverticulitis. Scattered diverticula of the sigmoid colon. Multilevel lumbar impingement. Electronically Signed   By: Van Clines M.D.   On: 07/02/2021 11:53    Procedures .Critical Care  Date/Time: 07/02/2021 3:58 PM Performed by: Volanda Napoleon,  PA-C Authorized by: Volanda Napoleon, PA-C   Critical care provider statement:    Critical care time (minutes):  35   Critical care time was exclusive of:  Separately billable procedures and treating other patients   Critical care was necessary to treat or prevent imminent or life-threatening deterioration of the following conditions:  Sepsis   Critical care was time spent personally by me on the following activities:  Discussions with consultants, evaluation of patient's response to treatment, examination of patient, ordering and performing treatments and interventions, ordering and review of laboratory studies, ordering and review of radiographic studies, pulse oximetry, re-evaluation of patient's condition, obtaining history from patient or surrogate and review of old charts   I assumed direction of critical care for this patient from another provider in my specialty: yes     Care discussed with: admitting provider     Medications Ordered in ED Medications  acetaminophen (TYLENOL) tablet 650 mg (has no administration in time range)  traMADol (ULTRAM) tablet 50 mg (has no administration in time range)  furosemide (LASIX) tablet 40 mg (has no administration in time range)  DULoxetine (CYMBALTA) DR capsule 20 mg (has no administration in time range)  levothyroxine (SYNTHROID) tablet 25 mcg (has no administration in time range)  senna (SENOKOT) tablet 8.6 mg (has no administration in time range)  finasteride (PROSCAR) tablet 5 mg (has no administration in time range)  tamsulosin (FLOMAX) capsule 0.4 mg (has no administration in time range)  vitamin B-12 (CYANOCOBALAMIN) tablet 1,000 mcg (has no administration in time range)  pregabalin (LYRICA) capsule 25 mg (has no administration in time range)  primidone (MYSOLINE) tablet 250 mg (has no administration in time range)  topiramate (TOPAMAX) tablet 50 mg (has no administration in time range)  multivitamin with minerals tablet 1 tablet (has no  administration in time range)  enoxaparin (LOVENOX) injection 40 mg (has no administration in time range)  polyethylene glycol (MIRALAX / GLYCOLAX) packet 17 g (has no administration in time range)  Warfarin - Pharmacist Dosing Inpatient ( Does not apply Not Given 07/02/21 1513)  warfarin (COUMADIN) tablet 7.5 mg (has no administration in time range)  0.9 %  sodium chloride infusion (has no administration in time range)  meropenem (MERREM) 1 g in sodium chloride 0.9 % 100 mL IVPB (has no administration in time range)  sodium chloride 0.9 % bolus 500 mL (0 mLs Intravenous Stopped 07/02/21 1144)  iohexol (OMNIPAQUE) 300 MG/ML solution 70 mL (70 mLs Intravenous Contrast Given 07/02/21 1139)  acetaminophen (TYLENOL) tablet 1,000 mg (1,000 mg Oral Given 07/02/21 1248)  cefTRIAXone (ROCEPHIN) 1 g in sodium chloride 0.9 % 100 mL IVPB (0 g Intravenous Stopped 07/02/21 1403)  sodium chloride 0.9 % bolus 500 mL (500 mLs Intravenous New Bag/Given 07/02/21 1420)    ED Course  I have reviewed the triage vital signs and the nursing notes.  Pertinent labs & imaging results that were available during my care of the patient were reviewed by me and considered in my medical decision making (see chart for details).    MDM Rules/Calculators/A&P                          87 year old male who presents for evaluation of generalized weakness.  Reports falling today as he was trying to get out of bed.  Does not think he hit his head but is on Coumadin.  He states  this fall was unwitnessed.  On initial arrival, he has a low-grade fever.  He is slightly tachypneic.  Vitals otherwise stable.  Blood pressure was initially soft is improved.  On exam, he has some abdominal tenderness noted diffusely.  We will plan to check labs, imaging of chest, head, abdomen.  Chest x-ray negative.  CT ab pelvis shows no acute findings in the abdomen.  Bladder does have some mild wall thickening.  CT head negative.  CBC shows Hgb 11.2, slight  leukocytosis of 10.9. BMP shows BUN of 26, Cr of 1.68.  Given his history of CHF, will do a small amount of fluid resuscitation for AKI. UA shows small hemoglobin, large leukocytes, pyuria, bacteria.  Urine culture sent.  Lactic is 1.1.  Code sepsis initiated.  Patient started on Rocephin given concerns for urinary tract infection.  Given that patient's lactic is 1.1 and he has a history of CHF, will hold off on full 30 cc/kg of fluid.   Discussed patient with Dr. Markus Jarvis (hosiptalist) who accepts patient for admission.   Portions of this note were generated with [employer]. Dictation errors may occur despite best attempts at proofreading.   Final Clinical Impression(s) / ED Diagnoses Final diagnoses:  Acute cystitis without hematuria  Sepsis, due to unspecified organism, unspecified whether acute organ dysfunction present [facility])    Rx / DC Orders ED Discharge Orders     None        Desma Mcgregor 07/02/21 1558    Wyvonnia Dusky, MD 07/02/21 1652

## 2021-07-02 NOTE — H&P (Signed)
History and Physical    Douglas Walker OZH:[id number] DOB: 1925/04/06 DOA: 07/02/2021  PCP: Josetta Huddle, MD (Confirm with patient/family/NH records and if not entered, this has to be entered at [facility] point of entry) Patient coming from: Home  I have personally briefly reviewed patient's old medical records in [place]  Chief Complaint: Scrotum hurts  HPI: Douglas Walker is a 87 y.o. male with medical history significant of recurrent ESBL UTI, poorly controlled BPH, CVA and PAF on Coumadin, CKD stage III, SSS s/p PPM, hypothyroidism presented with worsening of bilateral scrotum pain and urinary frequency.  Patient confused, most history provided by patient daughter and son over the phone.  Family reported that patient has had multiple of ESBL UTI since last year, and was started on suppressive nitrofurantoin once a day, by urology on June 07 2021.  Despite, patient continued to have urinary frequency, and woke up at night 3-4 times to urinate.  Last few days, patient has complained about bilateral scrotum pain "my testicles are bigger" and patient thought he had hernia, but son did not feel any hernia with home exam.  Moreover, patient complained lower abdominal pain and developed generalized weakness.  No focal, no fever at home.  Chronic constipated, has been eating and drinking well denies any chest pain or shortness of breath.  ED Course: Low-grade fever 100.4, no tachycardia hypotension, lactic acid within normal limits, UA showed compatible with UTI. CT abdomen pelvis  Review of Systems: Unable to perform, patient confused and hard of hearing.   Past Medical History:  Diagnosis Date   Arthritis    shoulders and back   Cancer ([place])    skin cancers   Chronic low back pain 12/15/2017   CKD (chronic kidney disease) stage 3, GFR 30-59 ml/min (HCC)    Coronary artery disease    Dysrhythmia     Paroxysmal atrial fibrillation; Tachycardia-bradycardia syndrome   Essential  tremor 02/28/2016   GERD (gastroesophageal reflux disease)    Hernia    History of hiatal hernia    Hyperlipemia    Hypertension    Macular degeneration    Neuromuscular disorder (HCC)    neuropathy   Paroxysmal atrial fibrillation ([place]) 10/28/2013   Presence of permanent cardiac pacemaker 12/04/2018   Stroke ([place]) 12/2020   Tachycardia-bradycardia syndrome ([place]) 09/13/2014   TIA (transient ischemic attack)     Past Surgical History:  Procedure Laterality Date   [place] / REPLACE / REMOVE PACEMAKER  12/04/2018   IRRIGATION AND DEBRIDEMENT ABSCESS Right 07/24/2020   Procedure: IRRIGATION AND DEBRIDEMENT HEMATOMA;  Surgeon: Coralie Keens, MD;  Location: [place] [facility];  Service: General;  Laterality: Right;   MASS EXCISION Right 07/20/2020   Procedure: EXCISION OF RIGHT CHEST WALL MASS;  Surgeon: Stark Klein, MD;  Location: [facility];  Service: General;  Laterality: Right;   PACEMAKER IMPLANT N/A 12/04/2018   Procedure: PACEMAKER IMPLANT;  Surgeon: Evans Lance, MD;  Location: [facility];  Service: Cardiovascular;  Laterality: N/A;     reports that he quit smoking about 58 years ago. His smoking use included cigarettes. He has never used smokeless tobacco. He reports that he does not drink alcohol and does not use drugs.  Allergies  Allergen Reactions   Sulfa Antibiotics Other (See Comments)    Weakness, dizziness   Primidone  Other reaction(s): increased sedation   Simvastatin     Other reaction(s): arthralgias and fatigue, severe    Family History  Problem Relation Age of Onset   GI problems Mother    Other Sister        PAIN ISSUES   Hearing loss Sister    Blindness Sister    Heart attack Neg Hx    Stroke Neg Hx      Prior to Admission medications   Medication Sig Start Date End Date Taking? Authorizing Provider  acetaminophen (TYLENOL) 325 MG tablet Take 2 tablets (650 mg total) by mouth  every 4 (four) hours as needed for mild pain (or temp > 37.5 C (99.5 F)). 02/14/21   Angiulli, Lavon Paganini, PA-C  DULoxetine (CYMBALTA) 20 MG capsule Take by mouth. 06/11/21   [provider]  DULoxetine (CYMBALTA) 60 MG capsule Take 1 capsule (60 mg total) by mouth daily. 04/23/21   Lovorn, Jinny Blossom, MD  finasteride (PROSCAR) 5 MG tablet Take 1 tablet (5 mg total) by mouth daily. 02/14/21   Angiulli, Lavon Paganini, PA-C  fluocinonide cream (LIDEX) 0.05 % Apply topically 3 (three) times daily. Apply to affected area 02/14/21   Angiulli, Lavon Paganini, PA-C  furosemide (LASIX) 20 MG tablet Take 2 tablets (40 mg total) by mouth daily. 02/14/21   Angiulli, Lavon Paganini, PA-C  levothyroxine (SYNTHROID) 25 MCG tablet Take 1 tablet (25 mcg total) by mouth daily before breakfast. 02/14/21   Angiulli, Lavon Paganini, PA-C  Maltodextrin-Xanthan Gum (RESOURCE THICKENUP CLEAR) POWD Take 120 g by mouth as needed. 02/14/21   Angiulli, Lavon Paganini, PA-C  Multiple Vitamin (MULTIVITAMIN WITH MINERALS) TABS tablet Take 1 tablet by mouth daily.    [provider]  nitrofurantoin, macrocrystal-monohydrate, (MACROBID) 100 MG capsule Take 100 mg by mouth daily. 06/07/21   [provider]  pregabalin (LYRICA) 25 MG capsule TAKE 1 CAPSULE(25 MG) BY MOUTH AT BEDTIME 02/14/21   Angiulli, Lavon Paganini, PA-C  primidone (MYSOLINE) 250 MG tablet Take 1 tablet (250 mg total) by mouth 2 (two) times daily. 02/14/21   Angiulli, Lavon Paganini, PA-C  senna (SENOKOT) 8.6 MG tablet Take 1 tablet by mouth daily as needed for constipation.    [provider]  tamsulosin (FLOMAX) 0.4 MG CAPS capsule Take 1 capsule (0.4 mg total) by mouth 2 (two) times daily. 02/14/21   Angiulli, Lavon Paganini, PA-C  topiramate (TOPAMAX) 50 MG tablet Take 1 tablet (50 mg total) by mouth 2 (two) times daily. 02/14/21   Angiulli, Lavon Paganini, PA-C  traMADol (ULTRAM) 50 MG tablet Take 1 tablet (50 mg total) by mouth every 6 (six) hours as needed for severe pain (if tylenol not  effective). 02/14/21   Angiulli, Lavon Paganini, PA-C  vitamin B-12 (CYANOCOBALAMIN) 1000 MCG tablet Take 1 tablet (1,000 mcg total) by mouth every other day. 02/14/21   Angiulli, Lavon Paganini, PA-C  warfarin (COUMADIN) 5 MG tablet Take 1 to 1.5 tablets daily as directed by Coumadin clinic 05/01/21   Belva Crome, MD    Physical Exam: Vitals:   07/02/21 1230 07/02/21 1330 07/02/21 1430 07/02/21 1445  BP: (!) 118/58 (!) 110/59 (!) 114/58 119/60  Pulse: 70 70 69 70  Resp: 19 19 17 18   Temp:      TempSrc:      SpO2: 98% 99% 99% 98%    Constitutional: NAD, calm, comfortable Vitals:   07/02/21 1230 07/02/21 1330 07/02/21 1430 07/02/21 1445  BP: (!) 118/58 (!) 110/59 (!) 114/58 119/60  Pulse: 70 70 69 70  Resp: 19 19 17 18   Temp:      TempSrc:      SpO2: 98% 99% 99% 98%   Eyes: PERRL, lids and conjunctivae normal ENMT: Mucous membranes are moist. Posterior pharynx clear of any exudate or lesions.Normal dentition.  Neck: normal, supple, no masses, no thyromegaly Respiratory: clear to auscultation bilaterally, no wheezing, no crackles. Normal respiratory effort. No accessory muscle use.  Cardiovascular: Regular rate and rhythm, no murmurs / rubs / gallops. No extremity edema. 2+ pedal pulses. No carotid bruits.  Abdomen: mild tenderness on suprapubic area, no rebound no guarding, tenderness on bilateral scrotum, no hernia sac felt on bilateral groin area, no masses palpated. No hepatosplenomegaly. Bowel sounds positive.  Musculoskeletal: no clubbing / cyanosis. No joint deformity upper and lower extremities. Good ROM, no contractures. Normal muscle tone.  Skin: no rashes, lesions, ulcers. No induration Neurologic: No facial droops, moving all limbs, following simple command. Psychiatric: Confused.    Labs on Admission: I have personally reviewed following labs and imaging studies  CBC: Recent Labs  Lab 07/02/21 0902  WBC 10.9*  HGB 11.2*  HCT 34.3*  MCV 100.0  PLT 100*   Basic  Metabolic Panel: Recent Labs  Lab 07/02/21 0902  NA 137  K 4.3  CL 102  CO2 28  GLUCOSE 107*  BUN 26*  CREATININE 1.68*  CALCIUM 9.0   GFR: CrCl cannot be calculated (Unknown ideal weight.). Liver Function Tests: No results for input(s): AST, ALT, ALKPHOS, BILITOT, PROT, ALBUMIN in the last 168 hours. No results for input(s): LIPASE, AMYLASE in the last 168 hours. No results for input(s): AMMONIA in the last 168 hours. Coagulation Profile: Recent Labs  Lab 07/02/21 0902  INR 1.7*   Cardiac Enzymes: No results for input(s): CKTOTAL, CKMB, CKMBINDEX, TROPONINI in the last 168 hours. BNP (last 3 results) No results for input(s): PROBNP in the last 8760 hours. HbA1C: No results for input(s): HGBA1C in the last 72 hours. CBG: No results for input(s): GLUCAP in the last 168 hours. Lipid Profile: No results for input(s): CHOL, HDL, LDLCALC, TRIG, CHOLHDL, LDLDIRECT in the last 72 hours. Thyroid Function Tests: No results for input(s): TSH, T4TOTAL, FREET4, T3FREE, THYROIDAB in the last 72 hours. Anemia Panel: No results for input(s): VITAMINB12, FOLATE, FERRITIN, TIBC, IRON, RETICCTPCT in the last 72 hours. Urine analysis:    Component Value Date/Time   COLORURINE AMBER (A) 07/02/2021 1016   APPEARANCEUR CLOUDY (A) 07/02/2021 1016   LABSPEC 1.018 07/02/2021 1016   PHURINE 6.0 07/02/2021 1016   GLUCOSEU NEGATIVE 07/02/2021 1016   HGBUR SMALL (A) 07/02/2021 1016   BILIRUBINUR NEGATIVE 07/02/2021 1016   KETONESUR NEGATIVE 07/02/2021 1016   PROTEINUR NEGATIVE 07/02/2021 1016   UROBILINOGEN 0.2 06/18/2015 0441   NITRITE NEGATIVE 07/02/2021 1016   LEUKOCYTESUR LARGE (A) 07/02/2021 1016    Radiological Exams on Admission: DG Chest 2 View  Result Date: 07/02/2021 CLINICAL DATA:  Weakness. EXAM: CHEST - 2 VIEW COMPARISON:  01/28/2021. FINDINGS: Cardiac silhouette is borderline enlarged. No mediastinal or hilar masses. No evidence of adenopathy. There are opacities in the  lung bases, similar to the prior exams, consistent with atelectasis/scarring. Remainder of the lungs is clear. No pleural effusion or pneumothorax. Left anterior chest wall sequential pacemaker is stable. Skeletal structures are demineralized, grossly intact. IMPRESSION: No acute cardiopulmonary disease. Electronically Signed   By: Lajean Manes M.D.   On: 07/02/2021 09:33   CT Head Wo Contrast  Result Date:  07/02/2021 CLINICAL DATA:  Head trauma.  Weakness. EXAM: CT HEAD WITHOUT CONTRAST TECHNIQUE: Contiguous axial images were obtained from the base of the skull through the vertex without intravenous contrast. COMPARISON:  01/28/2021 FINDINGS: Brain: Left frontal lobe encephalomalacia in the MCA distribution corresponding to the geographic region of involvement shown on perfusion brain study of 01/28/2021. Periventricular white matter and corona radiata hypodensities favor chronic ischemic microvascular white matter disease. Otherwise, the brainstem, cerebellum, cerebral peduncles, thalamus, basal ganglia, basilar cisterns, and ventricular system appear within normal limits. No intracranial hemorrhage, mass lesion, or acute CVA. Vascular: There is atherosclerotic calcification of the cavernous carotid arteries bilaterally. Skull: Unremarkable Sinuses/Orbits: Unremarkable Other: No supplemental non-categorized findings. IMPRESSION: 1. No acute intracranial findings. 2. Encephalomalacia in the left MCA distribution corresponding to the region of involvement shown on perfusion brain study of 01/28/2021. 3. Periventricular white matter and corona radiata hypodensities favor chronic ischemic microvascular white matter disease. Electronically Signed   By: Van Clines M.D.   On: 07/02/2021 11:58   CT ABDOMEN PELVIS W CONTRAST  Result Date: 07/02/2021 CLINICAL DATA:  Abdominal trauma, weakness. EXAM: CT ABDOMEN AND PELVIS WITH CONTRAST TECHNIQUE: Multidetector CT imaging of the abdomen and pelvis was performed  using the standard protocol following bolus administration of intravenous contrast. CONTRAST:  58mL OMNIPAQUE IOHEXOL 300 MG/ML  SOLN COMPARISON:  05/01/2021 FINDINGS: Despite efforts by the technologist and patient, motion artifact is present on today's exam and could not be eliminated. This reduces exam sensitivity and specificity. Lower chest: Dependent subsegmental atelectasis in both lower lobes. Aortic and coronary atherosclerosis. Dual lead pacer in place. Mild cardiomegaly. Mild gynecomastia. Hepatobiliary: Small hypodense lesions in the liver are technically nonspecific although unchanged and statistically likely to be benign cysts or similar benign lesions. Punctate calcifications in the liver compatible with old granulomatous disease. No biliary dilatation. Gallbladder unremarkable. Pancreas: Unremarkable Spleen: Unremarkable Adrenals/Urinary Tract: Both adrenal glands appear normal. 0.9 by 0.7 cm hypodense lesion medially in the right kidney upper pole is similar to the prior exam probably a cyst but technically too small to characterize. 3.0 by 2.5 cm fluid density lesion in the left mid kidney posteriorly compatible with cyst. Small suspected cysts of the right kidney lower pole noted. Multiple bladder cellules are present. Mild urinary bladder wall thickening may be from nondistention although cystitis is not excluded and correlation with urine analysis is suggested. Stomach/Bowel: Scattered diverticula of the terminal ileum. Sigmoid colon diverticulosis without active diverticulitis identified. Vascular/Lymphatic: Aortoiliac atherosclerotic vascular disease. Reproductive: Dystrophic calcifications in the prostate gland. Other: No supplemental non-categorized findings. Musculoskeletal: Mild grade 1 degenerative anterolisthesis at L3-4. Thoracolumbar spondylosis and degenerative disc disease causing multilevel impingement. IMPRESSION: 1. No acute posttraumatic findings in the abdomen. 2. Bladder  cellules with mild wall thickening of the urinary bladder, cellulitis not excluded. 3. Other imaging findings of potential clinical significance: Aortic Atherosclerosis (ICD10-I70.0). Mild cardiomegaly. Dual lead pacer. Hepatic and renal cysts. Sigmoid colon diverticulosis without active diverticulitis. Scattered diverticula of the sigmoid colon. Multilevel lumbar impingement. Electronically Signed   By: Van Clines M.D.   On: 07/02/2021 11:53    EKG: Independently reviewed. V paced  Assessment/Plan Active Problems:   Lower urinary tract infectious disease   UTI (urinary tract infection)  (please populate well all problems here in Problem List. (For example, if patient is on BP meds at home and you resume or decide to hold them, it is a problem that needs to be her. Same for CAD, COPD, HLD and so on)  Recurrent UTI -Failed outpatient suppression ABX regimen of Macrobid -Escalate antibiotics to meropenem, while waiting for urine culture, consult pharmacy.  Bilateral scrotum pain -Suspect epididymitis concurrent with UTI.  Check scrotal ultrasound.  Acute metabolic encephalopathy -Secondary to UTI, treat UTI and reevaluate.  CT head reassuring.  PAF -Continue Coumadin and beta-blocker  CKD stage III -Creatinine level stable, euvolemic, received IV contrast today, will run 12 hours normal saline for renal protection.  Repeat BMP tomorrow. -Hold Lasix today while on hydration  BPH -Poorly controlled despite on finasteride and Flomax -Check PVR. -Urology not considering surgery given patient's age.  Deconditioning -And acute ambulatory dysfunction -PT evaluation  DVT prophylaxis: Coumadin Code Status: DNR Family Communication: Son and daughter over the phone Disposition Plan: Expect more than 2 midnight hospital stay Consults called: None Admission status: MedSurg Admit   Lequita Halt MD [facility] Pager [phone]  07/02/2021, 3:13 PM

## 2021-07-02 NOTE — Progress Notes (Signed)
Cardiac monitoring set up for pt, pt has a permanent pacemaker.

## 2021-07-03 LAB — BASIC METABOLIC PANEL
Anion gap: 8 (ref 5–15)
BUN: 28 mg/dL — ABNORMAL HIGH (ref 8–23)
CO2: 25 mmol/L (ref 22–32)
Calcium: 8.5 mg/dL — ABNORMAL LOW (ref 8.9–10.3)
Chloride: 103 mmol/L (ref 98–111)
Creatinine, Ser: 1.6 mg/dL — ABNORMAL HIGH (ref 0.61–1.24)
GFR, Estimated: 40 mL/min — ABNORMAL LOW (ref >60)
Glucose, Bld: 94 mg/dL (ref 70–99)
Potassium: 3.7 mmol/L (ref 3.5–5.1)
Sodium: 136 mmol/L (ref 135–145)

## 2021-07-03 LAB — CBC WITH DIFFERENTIAL/PLATELET
Abs Immature Granulocytes: 0.07 10*3/uL (ref 0.00–0.07)
Basophils Absolute: 0 10*3/uL (ref 0.0–0.1)
Basophils Relative: 0 %
Eosinophils Absolute: 0.1 10*3/uL (ref 0.0–0.5)
Eosinophils Relative: 1 %
HCT: 30.4 % — ABNORMAL LOW (ref 39.0–52.0)
Hemoglobin: 10.4 g/dL — ABNORMAL LOW (ref 13.0–17.0)
Immature Granulocytes: 1 %
Lymphocytes Relative: 15 %
Lymphs Abs: 1.5 10*3/uL (ref 0.7–4.0)
MCH: 33.4 pg (ref 26.0–34.0)
MCHC: 34.2 g/dL (ref 30.0–36.0)
MCV: 97.7 fL (ref 80.0–100.0)
Monocytes Absolute: 1.3 10*3/uL — ABNORMAL HIGH (ref 0.1–1.0)
Monocytes Relative: 13 %
Neutro Abs: 6.9 10*3/uL (ref 1.7–7.7)
Neutrophils Relative %: 70 %
Platelets: 87 10*3/uL — ABNORMAL LOW (ref 150–400)
RBC: 3.11 MIL/uL — ABNORMAL LOW (ref 4.22–5.81)
RDW: 15.8 % — ABNORMAL HIGH (ref 11.5–15.5)
WBC: 9.9 10*3/uL (ref 4.0–10.5)
nRBC: 0 % (ref 0.0–0.2)

## 2021-07-03 LAB — PROTIME-INR
INR: 2 — ABNORMAL HIGH (ref 0.8–1.2)
Prothrombin Time: 22.4 seconds — ABNORMAL HIGH (ref 11.4–15.2)

## 2021-07-03 MED ORDER — WARFARIN SODIUM 5 MG PO TABS
5.0000 mg | ORAL_TABLET | ORAL | Status: DC
  Administered 2021-07-04: 5 mg via ORAL
  Filled 2021-07-03: qty 1

## 2021-07-03 MED ORDER — WARFARIN SODIUM 7.5 MG PO TABS
7.5000 mg | ORAL_TABLET | ORAL | Status: DC
  Administered 2021-07-03: 7.5 mg via ORAL
  Filled 2021-07-03: qty 1

## 2021-07-03 NOTE — Care Management (Signed)
PT recommending HHPT and 24 hour supervision. Patient currently confused. Called and left message for patient's daughter [name] [phone]. Per chart looks like he had [facility] in Feb 2022.

## 2021-07-03 NOTE — Evaluation (Signed)
Physical Therapy Evaluation Patient Details Name: Douglas Walker MRN: [id number] DOB: 02-Jan-1925 Today's Date: 07/03/2021   History of Present Illness  87 y.o. male admitted with UTI, weakness, confusion, abdominal pain. PMH includes CKD, HTN, macular degeneration, CVA, OA.  Clinical Impression  Pt admitted with above diagnosis. Pt ambulated 52' with RW, distance limited by fatigue. Pt reports he has 36* assist at home. Pt currently with functional limitations due to the deficits listed below (see PT Problem List). Pt will benefit from skilled PT to increase their independence and safety with mobility to allow discharge to the venue listed below.       Follow Up Recommendations Home health PT;Supervision/Assistance - 24 hour    Equipment Recommendations  None recommended by PT    Recommendations for Other Services       Precautions / Restrictions Precautions Precautions: Fall Precaution Comments: denies falls in past 6 months; HOH & poor vision (bumps into objects with RW) Restrictions Weight Bearing Restrictions: No      Mobility  Bed Mobility Overal bed mobility: Needs Assistance Bed Mobility: Supine to Sit     Supine to sit: Supervision;HOB elevated     General bed mobility comments: supervision for safety    Transfers Overall transfer level: Needs assistance Equipment used: Rolling walker (2 wheeled) Transfers: Sit to/from Stand Sit to Stand: Min guard         General transfer comment: min/guard for safety  Ambulation/Gait Ambulation/Gait assistance: Min Web designer (Feet): 35 Feet Assistive device: Rolling walker (2 wheeled) Gait Pattern/deviations: Step-through pattern;Decreased stride length Gait velocity: decr   General Gait Details: steady, no loss of balance, frequently bumped into objects with RW, pt stated he has poor vision, min A to maneuver RW around objects  Stairs            Wheelchair Mobility    Modified Rankin  (Stroke Patients Only)       Balance Overall balance assessment: Needs assistance   Sitting balance-Leahy Scale: Good     Standing balance support: Bilateral upper extremity supported Standing balance-Leahy Scale: Fair                               Pertinent Vitals/Pain Pain Assessment: Faces Faces Pain Scale: Hurts little more Pain Location: B shoulders Pain Descriptors / Indicators: Sore Pain Intervention(s): Limited activity within patient's tolerance;Monitored during session;Repositioned    Home Living Family/patient expects to be discharged to:: Private residence Living Arrangements: Children Available Help at Discharge: Family;Available 24 hours/day Type of Home: House Home Access: Level entry     Home Layout: One level Home Equipment: Shower seat;Grab bars - toilet;Walker - 2 wheels;Cane - single point;Bedside commode;Grab bars - tub/shower;Hand held shower head;Wheelchair - manual Additional Comments: lives with son and DIL    Prior Function Level of Independence: Independent with assistive device(s)         Comments: walks with RW, independent ADLs     Hand Dominance   Dominant Hand: Right    Extremity/Trunk Assessment   Upper Extremity Assessment Upper Extremity Assessment: Generalized weakness    Lower Extremity Assessment Lower Extremity Assessment: Generalized weakness    Cervical / Trunk Assessment Cervical / Trunk Assessment: Normal  Communication   Communication: HOH  Cognition Arousal/Alertness: Awake/alert Behavior During Therapy: WFL for tasks assessed/performed Overall Cognitive Status: Difficult to assess  General Comments      Exercises     Assessment/Plan    PT Assessment Patient needs continued PT services  PT Problem List Decreased mobility;Decreased activity tolerance       PT Treatment Interventions Gait training;Therapeutic exercise;Balance  training;Functional mobility training;Patient/family education    PT Goals (Current goals can be found in the Care Plan section)  Acute Rehab PT Goals Patient Stated Goal: return home PT Goal Formulation: With patient Time For Goal Achievement: 07/17/21 Potential to Achieve Goals: Good    Frequency Min 3X/week   Barriers to discharge        Co-evaluation               AM-PAC PT "6 Clicks" Mobility  Outcome Measure Help needed turning from your back to your side while in a flat bed without using bedrails?: A Little Help needed moving from lying on your back to sitting on the side of a flat bed without using bedrails?: A Little Help needed moving to and from a bed to a chair (including a wheelchair)?: A Little Help needed standing up from a chair using your arms (e.g., wheelchair or bedside chair)?: A Little Help needed to walk in hospital room?: A Little Help needed climbing 3-5 steps with a railing? : A Little 6 Click Score: 18    End of Session Equipment Utilized During Treatment: Gait belt Activity Tolerance: Patient limited by fatigue;Patient tolerated treatment well Patient left: in chair;with call bell/phone within reach;with chair alarm set Nurse Communication: Mobility status PT Visit Diagnosis: Difficulty in walking, not elsewhere classified (R26.2)    Time: 3967-2897 PT Time Calculation (min) (ACUTE ONLY): 22 min   Charges:   PT Evaluation $PT Eval Moderate Complexity: 1 Mod         Philomena Doheny PT 07/03/2021  Acute Rehabilitation Services Pager [phone] Office [phone]

## 2021-07-03 NOTE — Progress Notes (Signed)
[facility] Progress Note  Patient: Douglas Walker    ELF:[id number]  DOA: 07/02/2021     Date of Service: the patient was seen and examined on 07/03/2021  Brief hospital course: Past medical history of BPH, CVA, PAF, GERD, CKD 3B, CAD, HTN, HLD, SP PPM implant.  Presents with complaints of fever and chills and generalized fatigue.  Found to have UTI likely from ESBL E. coli. Currently plan is continue with IV antibiotics and monitor response.  Subjective: No acute complaint.  No nausea no vomiting.  No further fever.  No chest pain abdominal pain.  Reports fatigue and tiredness.  Assessment and Plan: 1.  Recurrent ESBL UTI BPH On chronic outpatient suppression antibiotics with Macrobid. Currently presents with fever and chills. Fever curve improving.  Leukocytosis improving. On IV meropenem. Urine culture growing ESBL Currently on Flomax and finasteride.  Which we will continue. Recommend outpatient follow-up with urologist again.  2.  Scrotal pain. Suspected epididymitis-ruled out Ultrasound scrotum negative for any acute abnormality. 3 mm testicular cyst and family meter left epididymal cyst. No further work-up.  3.  Acute metabolic encephalopathy Present on admission. Presented with confusion currently back to baseline. Mentation improving after treating UTI.  4.  Paroxysmal A. fib, currently rate controlled Chronic diastolic CHF On warfarin. Continue to hold rate control medication Currently appears euvolemic. Holding antihypertensive regimen as well as Lasix.  5.  Hypothyroidism Continue Synthroid  6.  Mood disorder, essential tremors Currently on pregabalin, Topamax, Cymbalta. Will continue.  7.  Acute kidney injury on CKD 3B. Baseline serum creatinine around 1.3.  On presentation serum creatinine 1.6. Takes furosemide at home which is currently on hold. Monitor.  8.  Acute on chronic thrombocytopenia with acute component likely secondary to  infection. No evidence of DIC or TTP right now.  Monitor.  Scheduled Meds:  DULoxetine  20 mg Oral Daily   finasteride  5 mg Oral Daily   levothyroxine  25 mcg Oral QAC breakfast   multivitamin with minerals  1 tablet Oral Daily   polyethylene glycol  17 g Oral Daily   pregabalin  25 mg Oral QHS   primidone  250 mg Oral BID   tamsulosin  0.4 mg Oral BID   topiramate  50 mg Oral BID   vitamin B-12  1,000 mcg Oral QODAY   [START ON 07/04/2021] warfarin  5 mg Oral Once per day on Mon Wed Fri   And   warfarin  7.5 mg Oral Once per day on Sun Tue Thu Sat   Warfarin - Pharmacist Dosing Inpatient   Does not apply q1600   Continuous Infusions:  meropenem (MERREM) IV 1 g (07/03/21 1627)   PRN Meds: acetaminophen, traMADol  Body mass index is 29.84 kg/m.        DVT Prophylaxis:  warfarin (COUMADIN) tablet 7.5 mg  warfarin (COUMADIN) tablet 5 mg    Advance goals of care discussion: Pt is DNR.  Family Communication: family was present at bedside, at the time of interview.  The pt provided permission to discuss medical plan with the family. Opportunity was given to ask question and all questions were answered satisfactorily.   Data Reviewed: I have personally reviewed and interpreted daily labs, tele strips, imaging. Sodium stable.  Serum creatinine elevated.  Hemoglobin dropped from 11.2-10.4 likely dilutional. Chronic thrombocytopenia mildly worsening.  Physical Exam:  General: Appear in mild distress, no Rash; Oral Mucosa Clear, dry. no Abnormal Neck Mass Or lumps, Conjunctiva normal  Cardiovascular: S1  and S2 Present, no Murmur, Respiratory: good respiratory effort, Bilateral Air entry present and CTA, no Crackles, no wheezes Abdomen: Bowel Sound present, Soft and no tenderness Extremities: no Pedal edema Neurology: alert and oriented to time, place, and person affect appropriate. no new focal deficit Gait not checked due to patient safety concerns  Vitals:   07/02/21 1813  07/02/21 2207 07/03/21 0510 07/03/21 1321  BP: 119/63 121/74 (!) 123/51 (!) 114/57  Pulse: 70 70 70 69  Resp: 20 18 17    Temp: 98 F (36.7 C) 98.3 F (36.8 C) 99.4 F (37.4 C) 99.2 F (37.3 C)  TempSrc: Oral Oral Oral Oral  SpO2: 99% 98% 95% 90%  Weight:        Disposition:  Status is: Inpatient  Remains inpatient appropriate because:IV treatments appropriate due to intensity of illness or inability to take PO  Dispo: The patient is from: Home              Anticipated d/c is to: Home              Patient currently is not medically stable to d/c.   Difficult to place patient No  Time spent: 35 minutes. I reviewed all nursing notes, pharmacy notes, vitals, pertinent old records. I have discussed plan of care as described above with RN.  Author: Berle Mull, MD [facility] 07/03/2021 5:08 PM  To reach On-call, see care teams to locate the attending and reach out via [email or web address]. Between 7PM-7AM, please contact night-coverage If you still have difficulty reaching the attending provider, please page the [facility] (Director on Call) for [facility] on amion for assistance.

## 2021-07-03 NOTE — Progress Notes (Signed)
ANTICOAGULATION CONSULT NOTE - Initial Consult  Pharmacy Consult for warfarin Indication: atrial fibrillation  Allergies  Allergen Reactions   Sulfa Antibiotics Other (See Comments)    Weakness, dizziness   Primidone     Other reaction(s): increased sedation   Simvastatin     Other reaction(s): arthralgias and fatigue, severe    Patient Measurements: Weight: 93 kg (205 lb 0.4 oz)    Vital Signs: Temp: 99.4 F (37.4 C) (07/05 0510) Temp Source: Oral (07/05 0510) BP: 123/51 (07/05 0510) Pulse Rate: 70 (07/05 0510)  Labs: Recent Labs    07/02/21 0902 07/02/21 1420 07/03/21 0203  HGB 11.2*  --  10.4*  HCT 34.3*  --  30.4*  PLT 100*  --  87*  APTT  --  47*  --   LABPROT 20.1*  --  22.4*  INR 1.7*  --  2.0*  CREATININE 1.68*  --  1.60*    Estimated Creatinine Clearance: 32.8 mL/min (A) (by C-G formula based on SCr of 1.6 mg/dL (H)).   Medical History: Past Medical History:  Diagnosis Date   Arthritis    shoulders and back   Cancer ([place])    skin cancers   Chronic low back pain 12/15/2017   CKD (chronic kidney disease) stage 3, GFR 30-59 ml/min (HCC)    Coronary artery disease    Dysrhythmia     Paroxysmal atrial fibrillation; Tachycardia-bradycardia syndrome   Essential tremor 02/28/2016   GERD (gastroesophageal reflux disease)    Hernia    History of hiatal hernia    Hyperlipemia    Hypertension    Macular degeneration    Neuromuscular disorder (HCC)    neuropathy   Paroxysmal atrial fibrillation ([place]) 10/28/2013   Presence of permanent cardiac pacemaker 12/04/2018   Stroke ([place]) 12/2020   Tachycardia-bradycardia syndrome ([place]) 09/13/2014   TIA (transient ischemic attack)       Assessment: 87 yo M on warfarin PTA for Afib, also with hx CVA. Admit INR 1.7.   PTA Warfarin 5mg  MWF, 7.5mg  TTSS   INR up to 2 today. No new interactions.   Goal of Therapy:  INR 2-3 Monitor platelets by anticoagulation protocol: Yes   Plan:   Resume home dose   Monitor daily INR- when stable, decrese frequency  Monitor CBC and for signs/symptoms of bleeding     Benetta Spar, PharmD, BCPS, BCCP Clinical Pharmacist  Please check AMION for all [place] phone numbers After 10:00 PM, call [place] [phone]

## 2021-07-03 NOTE — TOC Initial Note (Addendum)
Transition of Care [facility]) - Initial/Assessment Note    Patient Details  Name: Douglas Walker MRN: [id number] Date of Birth: 09-Sep-1925  Transition of [facility] CM/SW Contact:    Douglas Favre, RN Phone Number: 07/03/2021, 4:35 PM  Clinical Narrative:                  Patient's daughter Douglas Walker returned call. Discussed PT recommendation for HHPT with family providing 24/7 supervision. Carla in agreement. Patient lives with her, he has someone with him at all times. He has had [facility] in the past and she would like [place] again , the same PT and OT if possible.   Patient has walker and shower chair at home already. Patient currently on oxygen but does not have oxygen at home.   Cindie with [name] accepted referral for HHPT/OT and will see if same HHPT/OT available  Expected Discharge Plan: [place]     Patient Goals and CMS Choice Patient states their goals for this hospitalization and ongoing recovery are:: to return CMS Medicare.gov Compare Post Acute Care list provided to:: Patient Choice offered to / list presented to : Patient, Adult Children  Expected Discharge Plan and Services Expected Discharge Plan: [place]   Discharge Planning Services: CM Consult Post Acute Care Choice: Home Health Living arrangements for the past 2 months: Single Family Home                 DME Arranged: N/A DME Agency: NA       HH Arranged: PT, OT HH Agency: [place] Date [facility] Agency Contacted: 07/03/21 Time HH Agency Contacted: 1632 Representative spoke with at [place]: Cindie  Prior Living Arrangements/Services Living arrangements for the past 2 months: [place] Lives with:: Adult Children Patient language and need for interpreter reviewed:: Yes Do you feel safe going back to the place where you live?: Yes      Need for Family Participation in Patient Care: Yes (Comment) Care giver support system in  place?: Yes (comment)   Criminal Activity/Legal Involvement Pertinent to Current Situation/Hospitalization: No - Comment as needed  Activities of Daily Living      Permission Sought/Granted   Permission granted to share information with : Yes, Verbal Permission Granted  Share Information with NAME: Douglas Walker daughter  Permission granted to share info w AGENCY: [name]  Permission granted to share info w Relationship: daughter     Emotional Assessment Appearance:: Appears stated age     Orientation: : Oriented to Self Alcohol / Substance Use: Not Applicable Psych Involvement: No (comment)  Admission diagnosis:  Testicular pain [N50.819] UTI (urinary tract infection) [N39.0] Acute cystitis without hematuria [N30.00] Sepsis, due to unspecified organism, unspecified whether acute organ dysfunction present [facility]) [A41.9] Patient Active Problem List   Diagnosis Date Noted   UTI (urinary tract infection) 07/02/2021   Pain in both testicles    Right hemiparesis ([place]) 04/23/2021   Cerebrovascular accident (CVA) due to embolism of left middle cerebral artery ([place]) 04/23/2021   Neuropathy 04/23/2021   Abnormality of gait 04/23/2021   Abnormal results of thyroid function studies 04/04/2021   Acquired iron deficiency anemia due to decreased absorption 04/04/2021   Acquired thrombophilia ([place]) 04/04/2021   Cerebral atherosclerosis 04/04/2021   Basal cell carcinoma of nose 04/04/2021   Benign hypertensive heart and renal disease, with heart and renal failure ([place]) 04/04/2021   Benign prostatic hyperplasia 04/04/2021   Obesity  04/04/2021   Bradycardia 04/04/2021   Closed fracture of one rib 04/04/2021   Constipation by delayed colonic transit 04/04/2021   Fatigue 04/04/2021   Gastro-esophageal reflux disease without esophagitis 04/04/2021   Hypothyroidism 04/04/2021   Left lower quadrant pain 04/04/2021   Lumbar radiculopathy 04/04/2021   Reactive depression 04/04/2021    Malignant neoplasm of anterior wall of urinary bladder ([place]) 04/04/2021   Mass of chest wall 04/04/2021   Mixed hyperlipidemia 04/04/2021   Nocturia 04/04/2021   Other shoulder lesions, right shoulder 04/04/2021   Pain in limb 04/04/2021   Peripheral edema 04/04/2021   Personal history of malignant neoplasm of bladder 04/04/2021   Pruritus of genitalia 04/04/2021   Tremor 04/04/2021   Unilateral primary osteoarthritis, left knee 04/04/2021   Unspecified mononeuropathy of unspecified lower limb 04/04/2021   Urinary incontinence 04/04/2021   Dysphagia, post-stroke    Chronic diastolic congestive heart failure (HCC)    Chronic pain syndrome    Left middle cerebral artery stroke ([place]) 02/01/2021   Acute CVA (cerebrovascular accident) ([place]) 01/28/2021   Pacemaker 08/30/2020   Hematoma, chest wall 07/24/2020   Exudative age-related macular degeneration of right eye with inactive choroidal neovascularization ([place]) 05/10/2020   Exudative age-related macular degeneration of left eye with inactive choroidal neovascularization ([place]) 05/10/2020   Retinal hemorrhage of right eye 05/10/2020   Exposure keratopathy, bilateral 05/10/2020   Bilateral dry eyes 05/10/2020   Advanced nonexudative age-related macular degeneration of both eyes with subfoveal involvement 05/10/2020   Sick sinus syndrome ([place]) 12/06/2018   Complete heart block (HCC) 12/04/2018   Chronic low back pain 12/15/2017   Hereditary and idiopathic peripheral neuropathy 07/01/2016   Essential tremor 02/28/2016   BPPV (benign paroxysmal positional vertigo) 06/19/2015   Dizziness 06/18/2015   Bacteria in urine 06/18/2015   CKD (chronic kidney disease) stage 3, GFR 30-59 ml/min: Probable 06/18/2015   Chronic diastolic CHF (congestive heart failure) ([place]) 05/30/2015   Fall    Syncope 05/19/2015   Lower urinary tract infectious disease 05/19/2015   Hypotension 05/19/2015   Dehydration 05/19/2015   Fracture of rib of left side  05/19/2015   Syncope and collapse 05/19/2015   Mobitz type II atrioventricular block 09/13/2014   Essential hypertension 09/13/2014   Chronic anticoagulation 09/13/2014   Tachycardia-bradycardia syndrome ([place]) 09/13/2014   Paroxysmal atrial fibrillation ([place]) 10/28/2013   Thrombocytopenia ([place]) 04/06/2013   PCP:  Josetta Huddle, MD Pharmacy:   [facility] [place], [address] [place] [address] Phone: [phone] Fax: [phone]     Social Determinants of Health (SDOH) Interventions    Readmission Risk Interventions Readmission Risk Prevention Plan 07/26/2020  Transportation Screening Complete  PCP or Specialist Appt within 5-7 Days Not Complete  Not Complete comments pending disposition  Home Care Screening Complete  Medication Review (RN CM) Referral to Pharmacy  Some recent data might be hidden

## 2021-07-04 LAB — BASIC METABOLIC PANEL
Anion gap: 8 (ref 5–15)
BUN: 29 mg/dL — ABNORMAL HIGH (ref 8–23)
CO2: 26 mmol/L (ref 22–32)
Calcium: 8.5 mg/dL — ABNORMAL LOW (ref 8.9–10.3)
Chloride: 104 mmol/L (ref 98–111)
Creatinine, Ser: 1.71 mg/dL — ABNORMAL HIGH (ref 0.61–1.24)
GFR, Estimated: 37 mL/min — ABNORMAL LOW (ref >60)
Glucose, Bld: 142 mg/dL — ABNORMAL HIGH (ref 70–99)
Potassium: 3.8 mmol/L (ref 3.5–5.1)
Sodium: 138 mmol/L (ref 135–145)

## 2021-07-04 LAB — CBC WITH DIFFERENTIAL/PLATELET
Abs Immature Granulocytes: 0.06 10*3/uL (ref 0.00–0.07)
Basophils Absolute: 0 10*3/uL (ref 0.0–0.1)
Basophils Relative: 0 %
Eosinophils Absolute: 0.1 10*3/uL (ref 0.0–0.5)
Eosinophils Relative: 1 %
HCT: 31.9 % — ABNORMAL LOW (ref 39.0–52.0)
Hemoglobin: 10.6 g/dL — ABNORMAL LOW (ref 13.0–17.0)
Immature Granulocytes: 1 %
Lymphocytes Relative: 8 %
Lymphs Abs: 0.8 10*3/uL (ref 0.7–4.0)
MCH: 32.8 pg (ref 26.0–34.0)
MCHC: 33.2 g/dL (ref 30.0–36.0)
MCV: 98.8 fL (ref 80.0–100.0)
Monocytes Absolute: 1.4 10*3/uL — ABNORMAL HIGH (ref 0.1–1.0)
Monocytes Relative: 13 %
Neutro Abs: 8.2 10*3/uL — ABNORMAL HIGH (ref 1.7–7.7)
Neutrophils Relative %: 77 %
Platelets: 85 10*3/uL — ABNORMAL LOW (ref 150–400)
RBC: 3.23 MIL/uL — ABNORMAL LOW (ref 4.22–5.81)
RDW: 15.8 % — ABNORMAL HIGH (ref 11.5–15.5)
WBC: 10.6 10*3/uL — ABNORMAL HIGH (ref 4.0–10.5)
nRBC: 0 % (ref 0.0–0.2)

## 2021-07-04 LAB — PROTIME-INR
INR: 2.1 — ABNORMAL HIGH (ref 0.8–1.2)
Prothrombin Time: 23.4 seconds — ABNORMAL HIGH (ref 11.4–15.2)

## 2021-07-04 LAB — URINE CULTURE: Culture: >=100000 — AB

## 2021-07-04 LAB — TECHNOLOGIST SMEAR REVIEW

## 2021-07-04 LAB — MAGNESIUM: Magnesium: 2.2 mg/dL (ref 1.7–2.4)

## 2021-07-04 MED ORDER — SODIUM CHLORIDE 0.9 % IV SOLN: INTRAVENOUS | Status: AC

## 2021-07-04 MED ORDER — FOSFOMYCIN TROMETHAMINE 3 G PO PACK
3.0000 g | PACK | Freq: Once | ORAL | Status: DC
  Filled 2021-07-04: qty 3

## 2021-07-04 NOTE — Progress Notes (Signed)
[facility] Progress Note  Patient: Douglas Walker    NAT:[id number]  DOA: 07/02/2021     Date of Service: the patient was seen and examined on 07/04/2021  Brief hospital course: Past medical history of BPH, CVA, PAF, GERD, CKD 3B, CAD, HTN, HLD, SP PPM implant.  Presents with complaints of fever and chills and generalized fatigue.  Found to have UTI likely from ESBL E. coli. Currently plan is continue with antibiotics, treat AKI and monitor response.  Subjective: No acute complaint.  Feeling better.  No nausea no vomiting.  No abdominal pain.  No diarrhea no constipation.  Had a fever during the day.  Assessment and Plan: 1.  Recurrent ESBL UTI BPH On chronic outpatient suppression antibiotics with Macrobid. Currently presents with fever and chills. Fever curve improving.  Leukocytosis improving. On IV meropenem.  We will switch to fosfomycin and monitor. Urine culture growing ESBL Currently on Flomax and finasteride.  Which we will continue. Recommend outpatient follow-up with urologist again.  2.  Scrotal pain. Suspected epididymitis-ruled out Ultrasound scrotum negative for any acute abnormality. 3 mm testicular cyst and family meter left epididymal cyst. No further work-up.  3.  Acute metabolic encephalopathy Present on admission. Presented with confusion currently back to baseline. Mentation improving after treating UTI.  4.  Paroxysmal A. fib, currently rate controlled Chronic diastolic CHF On warfarin. Continue to hold rate control medication Currently appears euvolemic. Holding antihypertensive regimen as well as Lasix.  5.  Hypothyroidism Continue Synthroid  6.  Mood disorder, essential tremors Currently on pregabalin, Topamax, Cymbalta. Will continue.  7.  Acute kidney injury on CKD 3B. Baseline serum creatinine around 1.3.  On presentation serum creatinine 1.6. Takes furosemide at home which is currently on hold. Due to worsening renal function we  will provide IV fluids.  8.  Acute on chronic thrombocytopenia with acute component likely secondary to infection. Platelet count currently stable.  Hemoglobin stable. No evidence of DIC or TTP right now.  Monitor.  Scheduled Meds:  DULoxetine  20 mg Oral Daily   finasteride  5 mg Oral Daily   fosfomycin  3 g Oral Once   levothyroxine  25 mcg Oral QAC breakfast   multivitamin with minerals  1 tablet Oral Daily   polyethylene glycol  17 g Oral Daily   pregabalin  25 mg Oral QHS   primidone  250 mg Oral BID   tamsulosin  0.4 mg Oral BID   topiramate  50 mg Oral BID   vitamin B-12  1,000 mcg Oral QODAY   warfarin  5 mg Oral Once per day on Mon Wed Fri   And   warfarin  7.5 mg Oral Once per day on Sun Tue Thu Sat   Warfarin - Pharmacist Dosing Inpatient   Does not apply q1600   Continuous Infusions:  sodium chloride 75 mL/hr at 07/04/21 1324   PRN Meds: acetaminophen, traMADol  Body mass index is 29.84 kg/m.        DVT Prophylaxis:  warfarin (COUMADIN) tablet 7.5 mg  warfarin (COUMADIN) tablet 5 mg    Advance goals of care discussion: Pt is DNR.  Family Communication: family was present at bedside, at the time of interview.  The pt provided permission to discuss medical plan with the family. Opportunity was given to ask question and all questions were answered satisfactorily.   Data Reviewed: I have personally reviewed and interpreted daily labs, tele strips, imaging. Sodium 138.  Potassium 3.8.  Serum creatinine increased  from 1.6-1.71.  Hemoglobin stable.  Platelets stable at 85.  Physical Exam:  General: Appear in mild distress, no Rash; Oral Mucosa Clear, moist. no Abnormal Neck Mass Or lumps, Conjunctiva normal  Cardiovascular: S1 and S2 Present, no Murmur, Respiratory: good respiratory effort, Bilateral Air entry present and CTA, no Crackles, no wheezes Abdomen: Bowel Sound present, Soft and no tenderness Extremities: no Pedal edema Neurology: alert and not to  time, place, and person affect appropriate. no new focal deficit Gait not checked due to patient safety concerns  Vitals:   07/04/21 0522 07/04/21 1319 07/04/21 1526 07/04/21 1727  BP: 131/68 137/71 115/61 118/67  Pulse: 70 70 70 69  Resp: 17 19 19 18   Temp: 98.1 F (36.7 C) (!) 102.8 F (39.3 C) 98.6 F (37 C) 98.7 F (37.1 C)  TempSrc: Oral Oral Oral Oral  SpO2: 100% 99% 97% 96%  Weight:        Disposition:  Status is: Inpatient  Remains inpatient appropriate because:IV treatments appropriate due to intensity of illness or inability to take PO  Dispo: The patient is from: Home              Anticipated d/c is to: Home              Patient currently is not medically stable to d/c.   Difficult to place patient No  Time spent: 35 minutes. I reviewed all nursing notes, pharmacy notes, vitals, pertinent old records. I have discussed plan of care as described above with RN.  Author: Berle Mull, MD [facility] 07/04/2021 6:05 PM  To reach On-call, see care teams to locate the attending and reach out via [email or web address]. Between 7PM-7AM, please contact night-coverage If you still have difficulty reaching the attending provider, please page the [facility] (Director on Call) for [facility] on amion for assistance.

## 2021-07-04 NOTE — Progress Notes (Signed)
   07/04/21 1319  Assess: MEWS Score  Temp (!) 102.8 F (39.3 C)  BP 137/71  Pulse Rate 70  Resp 19  SpO2 99 %  O2 Device Nasal Cannula  Assess: MEWS Score  MEWS Temp 2  MEWS Systolic 0  MEWS Pulse 0  MEWS RR 0  MEWS LOC 0  MEWS Score 2  MEWS Score Color Yellow  Assess: if the MEWS score is Yellow or Red  Were vital signs taken at a resting state? Yes  Focused Assessment No change from prior assessment  Early Detection of Sepsis Score *See Row Information* Medium  MEWS guidelines implemented *See Row Information* Yes  Treat  MEWS Interventions Administered prn meds/treatments  Pain Scale 0-10  Pain Score 0  Take Vital Signs  Increase Vital Sign Frequency  Yellow: Q 2hr X 2 then Q 4hr X 2, if remains yellow, continue Q 4hrs  Escalate  MEWS: Escalate Yellow: discuss with charge nurse/RN and consider discussing with provider and RRT  Notify: Charge Nurse/RN  Name of Charge Nurse/RN Notified Lanisha  Date Charge Nurse/RN Notified 07/04/21  Time Charge Nurse/RN Notified 1330  Notify: Provider  Provider Name/Title Dr Posey Pronto  Date Provider Notified 07/04/21  Time Provider Notified 1343  Notification Type Page  Notification Reason Other (Comment) (fever and yellow mews)  Provider response No new orders  Document  Progress note created (see row info) Yes  Pt had a fever of 102.8 triggering yellow MEWS. Environmental factors such as blankets and heat in room adjusted. Prn tylenol administered. MD notified via text page, no new orders received at this time

## 2021-07-04 NOTE — Progress Notes (Signed)
ANTICOAGULATION CONSULT NOTE - Initial Consult  Pharmacy Consult for warfarin Indication: atrial fibrillation  Allergies  Allergen Reactions   Sulfa Antibiotics Other (See Comments)    Weakness, dizziness   Primidone     Other reaction(s): increased sedation   Simvastatin     Other reaction(s): arthralgias and fatigue, severe    Patient Measurements: Weight: 93 kg (205 lb 0.4 oz)    Vital Signs: Temp: 98.1 F (36.7 C) (07/06 0522) Temp Source: Oral (07/06 0522) BP: 131/68 (07/06 0522) Pulse Rate: 70 (07/06 0522)  Labs: Recent Labs    07/02/21 0902 07/02/21 1420 07/03/21 0203 07/04/21 0041  HGB 11.2*  --  10.4*  --   HCT 34.3*  --  30.4*  --   PLT 100*  --  87*  --   APTT  --  47*  --   --   LABPROT 20.1*  --  22.4* 23.4*  INR 1.7*  --  2.0* 2.1*  CREATININE 1.68*  --  1.60*  --      Estimated Creatinine Clearance: 32.8 mL/min (A) (by C-G formula based on SCr of 1.6 mg/dL (H)).   Medical History: Past Medical History:  Diagnosis Date   Arthritis    shoulders and back   Cancer ([place])    skin cancers   Chronic low back pain 12/15/2017   CKD (chronic kidney disease) stage 3, GFR 30-59 ml/min (HCC)    Coronary artery disease    Dysrhythmia     Paroxysmal atrial fibrillation; Tachycardia-bradycardia syndrome   Essential tremor 02/28/2016   GERD (gastroesophageal reflux disease)    Hernia    History of hiatal hernia    Hyperlipemia    Hypertension    Macular degeneration    Neuromuscular disorder (HCC)    neuropathy   Paroxysmal atrial fibrillation ([place]) 10/28/2013   Presence of permanent cardiac pacemaker 12/04/2018   Stroke ([place]) 12/2020   Tachycardia-bradycardia syndrome ([place]) 09/13/2014   TIA (transient ischemic attack)       Assessment: 87 yo M on warfarin PTA for Afib, also with hx CVA. Admit INR 1.7.   PTA Warfarin 5mg  MWF, 7.5mg  TTSS   INR 2.1 and therapeutic on home dose. No new interactions.   Goal of Therapy:  INR 2-3 Monitor  platelets by anticoagulation protocol: Yes   Plan:   Continue home dose  Monitor daily INR- when stable, decrese frequency  Monitor CBC and for signs/symptoms of bleeding     Benetta Spar, PharmD, BCPS, BCCP Clinical Pharmacist  Please check AMION for all [place] phone numbers After 10:00 PM, call [place] [phone]

## 2021-07-04 NOTE — Hospital Course (Signed)
87 year old community dwelling white male tachybradycardia a Mobitz type 2+ PPM since 2016-on Coumadin HFpEF Prior history ESBL E. coli on suppressive Macrobid at home Prior TIA CKD 3B Hyperthyroidism BPH   Present chills weakness 07/02/2021 [facility] ED + fall and left upper arm tear Urine found to have ESBL UTI--placed on meropenem Also had scrotal pain swelling on admission-ultrasound scrotum showed only 3 mm testicular cyst and left epididymal cyst

## 2021-07-05 LAB — PROTIME-INR
INR: 1.8 — ABNORMAL HIGH (ref 0.8–1.2)
Prothrombin Time: 21.2 seconds — ABNORMAL HIGH (ref 11.4–15.2)

## 2021-07-05 LAB — BASIC METABOLIC PANEL
Anion gap: 9 (ref 5–15)
BUN: 32 mg/dL — ABNORMAL HIGH (ref 8–23)
CO2: 26 mmol/L (ref 22–32)
Calcium: 8.6 mg/dL — ABNORMAL LOW (ref 8.9–10.3)
Chloride: 104 mmol/L (ref 98–111)
Creatinine, Ser: 1.48 mg/dL — ABNORMAL HIGH (ref 0.61–1.24)
GFR, Estimated: 44 mL/min — ABNORMAL LOW (ref >60)
Glucose, Bld: 102 mg/dL — ABNORMAL HIGH (ref 70–99)
Potassium: 3.7 mmol/L (ref 3.5–5.1)
Sodium: 139 mmol/L (ref 135–145)

## 2021-07-05 LAB — CBC
HCT: 30.4 % — ABNORMAL LOW (ref 39.0–52.0)
Hemoglobin: 10.1 g/dL — ABNORMAL LOW (ref 13.0–17.0)
MCH: 32.6 pg (ref 26.0–34.0)
MCHC: 33.2 g/dL (ref 30.0–36.0)
MCV: 98.1 fL (ref 80.0–100.0)
Platelets: 84 10*3/uL — ABNORMAL LOW (ref 150–400)
RBC: 3.1 MIL/uL — ABNORMAL LOW (ref 4.22–5.81)
RDW: 15.4 % (ref 11.5–15.5)
WBC: 7.4 10*3/uL (ref 4.0–10.5)
nRBC: 0 % (ref 0.0–0.2)

## 2021-07-05 MED ORDER — WARFARIN SODIUM 5 MG PO TABS
10.0000 mg | ORAL_TABLET | Freq: Once | ORAL | Status: AC
  Administered 2021-07-05: 10 mg via ORAL
  Filled 2021-07-05: qty 2

## 2021-07-05 MED ORDER — FOSFOMYCIN TROMETHAMINE 3 G PO PACK
3.0000 g | PACK | Freq: Once | ORAL | Status: AC
  Administered 2021-07-05: 3 g via ORAL
  Filled 2021-07-05: qty 3

## 2021-07-05 NOTE — Plan of Care (Signed)

## 2021-07-05 NOTE — Progress Notes (Signed)
[facility] Progress Note  Patient: Douglas Walker    MGQ:[id number]  DOA: 07/02/2021     Date of Service: the patient was seen and examined on 07/05/2021  Brief hospital course: Past medical history of BPH, CVA, PAF, GERD, CKD 3B, CAD, HTN, HLD, SP PPM implant. Presents with complaints of fever and chills and generalized fatigue. Found to have UTI likely from ESBL E. coli. Currently plan is monitor for 24-hour fever free.  Likely can go home tomorrow.  Subjective: No further fever.  No nausea no vomiting.  No diarrhea.  No constipation.  Eating okay.  Assessment and Plan: 1.  Recurrent ESBL UTI BPH On chronic outpatient suppression antibiotics with Macrobid. Currently presents with fever and chills. Fever curve improving.  Leukocytosis improving.Urine culture growing ESBL On IV meropenem.  Patient was given fosfomycin on 7/7.  Monitor overnight Currently on Flomax and finasteride.  Which we will continue. Macrobid will likely not be a good antibiotic for suppression going forward. Recommend outpatient follow-up with urologist again.  2.  Scrotal pain. Suspected epididymitis-ruled out Ultrasound scrotum negative for any acute abnormality. 3 mm testicular cyst and family meter left epididymal cyst. No further work-up.  3.  Acute metabolic encephalopathy Present on admission. Presented with confusion currently back to baseline. Mentation improving after treating UTI.  4.  Paroxysmal A. fib, currently rate controlled Chronic diastolic CHF On warfarin. Continue to hold rate control medication Currently appears euvolemic. Holding antihypertensive regimen as well as Lasix. Family is worried that the patient is not making significant amount of urine.  I told them that this is likely secondary to poor p.o. intake in the setting of UTI.  5.  Hypothyroidism Continue Synthroid  6.  Mood disorder, essential tremors Currently on pregabalin, Topamax, Cymbalta. Will  continue.  7.  Acute kidney injury on CKD 3B. Baseline serum creatinine around 1.3.  On presentation serum creatinine 1.6.  Now improved to 1.4 after IV hydration. Takes furosemide at home which is currently on hold.  8.  Acute on chronic thrombocytopenia with acute component likely secondary to infection. Platelet count currently stable.  Hemoglobin stable. No evidence of DIC or TTP right now.  Monitor.  Scheduled Meds:  DULoxetine  20 mg Oral Daily   finasteride  5 mg Oral Daily   levothyroxine  25 mcg Oral QAC breakfast   multivitamin with minerals  1 tablet Oral Daily   polyethylene glycol  17 g Oral Daily   pregabalin  25 mg Oral QHS   primidone  250 mg Oral BID   tamsulosin  0.4 mg Oral BID   topiramate  50 mg Oral BID   vitamin B-12  1,000 mcg Oral QODAY   Warfarin - Pharmacist Dosing Inpatient   Does not apply q1600   Continuous Infusions:   PRN Meds: acetaminophen, traMADol  Body mass index is 29.84 kg/m.        DVT Prophylaxis:     Advance goals of care discussion: Pt is DNR.  Family Communication: family was present at bedside, at the time of interview.  The pt provided permission to discuss medical plan with the family. Opportunity was given to ask question and all questions were answered satisfactorily.   Data Reviewed: I have personally reviewed and interpreted daily labs, tele strips, imaging. Sodium potassium remaining stable.  Serum creatinine improving.  1.71 to 1.48.  Hemoglobin stable.  WBC continues to improve.  Platelet remained stable.  Physical Exam:  General: Appear in mild distress, no Rash;  Oral Mucosa Clear, moist. no Abnormal Neck Mass Or lumps, Conjunctiva normal  Cardiovascular: S1 and S2 Present, no Murmur, Respiratory: good respiratory effort, Bilateral Air entry present and CTA, no Crackles, no wheezes Abdomen: Bowel Sound present, Soft and no tenderness Extremities: no Pedal edema Neurology: alert and oriented to time, place, and  person affect appropriate. no new focal deficit, chronic lower jaw tremor. Gait not checked due to patient safety concerns   Vitals:   07/05/21 0605 07/05/21 0830 07/05/21 1218 07/05/21 1608  BP: 125/70 118/66 134/79 137/70  Pulse: 69 69 70 70  Resp: 19 18 17    Temp: 98.2 F (36.8 C) 98 F (36.7 C) 98.6 F (37 C) 98.9 F (37.2 C)  TempSrc: Oral Oral Oral Oral  SpO2: 98% 97% 98% 96%  Weight:        Disposition:  Status is: Inpatient  Remains inpatient appropriate because:IV treatments appropriate due to intensity of illness or inability to take PO  Dispo: The patient is from: Home              Anticipated d/c is to: Home              Patient currently is not medically stable to d/c.   Difficult to place patient No  Time spent: 35 minutes. I reviewed all nursing notes, pharmacy notes, vitals, pertinent old records. I have discussed plan of care as described above with RN.  Author: Berle Mull, MD [facility] 07/05/2021 5:24 PM  To reach On-call, see care teams to locate the attending and reach out via [email or web address]. Between 7PM-7AM, please contact night-coverage If you still have difficulty reaching the attending provider, please page the [facility] (Director on Call) for [facility] on amion for assistance.

## 2021-07-05 NOTE — Plan of Care (Signed)

## 2021-07-05 NOTE — Progress Notes (Signed)
Physical Therapy Treatment Patient Details Name: Douglas Walker MRN: [id number] DOB: 1925/12/19 Today's Date: 07/05/2021    History of Present Illness 87 y.o. male admitted with UTI, weakness, confusion, abdominal pain. PMH includes CKD, HTN, macular degeneration, CVA, OA.    PT Comments    Pt is demonstrating improved endurance and balance through being able to ambulate up to ~90 ft with a RW and min guard majority of time with intermittent minA. Pt tends to push himself and need cues to rest to ensure he maintains his safety as his legs appear to weaken as distance progresses. Educated pt and family on being aware of increasing weakness and cuing pt to sit to rest instead of continuing to ensure his safety at home. Performed supine warm-up exercises prior to ambulating to encourage increased ease and tolerance with mobility. Will continue to follow acutely. Current recommendations remain appropriate.  SpO2 >/= 90% throughout on RA, maintaining 95% sitting at rest end of session on RA    Follow Up Recommendations  Home health PT;Supervision/Assistance - 24 hour     Equipment Recommendations  None recommended by PT    Recommendations for Other Services       Precautions / Restrictions Precautions Precautions: Fall Precaution Comments: denies falls in past 6 months; HOH & poor vision (bumps into objects with RW) Restrictions Weight Bearing Restrictions: No    Mobility  Bed Mobility Overal bed mobility: Needs Assistance Bed Mobility: Supine to Sit     Supine to sit: Supervision;HOB elevated     General bed mobility comments: extra time with pt using rails to pull to sit up EOB with supervision for safety.    Transfers Overall transfer level: Needs assistance Equipment used: Rolling walker (2 wheeled) Transfers: Sit to/from Stand Sit to Stand: Min assist         General transfer comment: MinA to power up to stand from low surface of  EOB.  Ambulation/Gait Ambulation/Gait assistance: Min guard;Min assist Gait Distance (Feet): 90 Feet Assistive device: Rolling walker (2 wheeled) Gait Pattern/deviations: Step-through pattern;Decreased stride length;Shuffle Gait velocity: decr Gait velocity interpretation: <1.8 ft/sec, indicate of risk for recurrent falls General Gait Details: Pt with shakiness and poor bil feet clearance. Cues provided to improve posture and feet clearance, momentary success. Pt with increased hip and knee flexion with fatigue, intermittently needing minA to ensure he maintains balance otherwise min guard for safety. Pt tends to pick up RW during turns.   Stairs             Wheelchair Mobility    Modified Rankin (Stroke Patients Only) Modified Rankin (Stroke Patients Only) Pre-Morbid Rankin Score: Moderate disability Modified Rankin: Moderately severe disability     Balance Overall balance assessment: Needs assistance   Sitting balance-Leahy Scale: Good     Standing balance support: Bilateral upper extremity supported Standing balance-Leahy Scale: Poor Standing balance comment: Reliant on UE support.                            Cognition Arousal/Alertness: Awake/alert Behavior During Therapy: WFL for tasks assessed/performed Overall Cognitive Status: Difficult to assess                                 General Comments: Pt following commands with increased time.      Exercises General Exercises - Upper Extremity Shoulder Flexion: Both;10 reps;Supine General Exercises - Lower  Extremity Ankle Circles/Pumps: Both;10 reps;Supine Heel Slides: Both;10 reps;Supine Straight Leg Raises: Both;5 reps;Supine    General Comments General comments (skin integrity, edema, etc.): SpO2 >/= 90% throughout on RA, resting at 95% sitting end of session so kept [place] off, notified RN      Pertinent Vitals/Pain Pain Assessment: Faces Faces Pain Scale: Hurts little  more Pain Location: bil superior lateral back with noted redness, RN made aware Pain Descriptors / Indicators: Sore Pain Intervention(s): Limited activity within patient's tolerance;Monitored during session;Repositioned;Other (comment) (notified RN)    Home Living                      Prior Function            PT Goals (current goals can now be found in the care plan section) Acute Rehab PT Goals Patient Stated Goal: to go home with family PT Goal Formulation: With patient/family Time For Goal Achievement: 07/17/21 Potential to Achieve Goals: Good Progress towards PT goals: Progressing toward goals    Frequency    Min 3X/week      PT Plan Current plan remains appropriate    Co-evaluation              AM-PAC PT "6 Clicks" Mobility   Outcome Measure  Help needed turning from your back to your side while in a flat bed without using bedrails?: A Little Help needed moving from lying on your back to sitting on the side of a flat bed without using bedrails?: A Little Help needed moving to and from a bed to a chair (including a wheelchair)?: A Little Help needed standing up from a chair using your arms (e.g., wheelchair or bedside chair)?: A Little Help needed to walk in hospital room?: A Little Help needed climbing 3-5 steps with a railing? : A Little 6 Click Score: 18    End of Session Equipment Utilized During Treatment: Gait belt Activity Tolerance: Patient tolerated treatment well Patient left: in chair;with call bell/phone within reach;with chair alarm set;with family/visitor present Nurse Communication: Mobility status;Other (comment) (sats and left pt with [place] doffed, redness and pain on back) PT Visit Diagnosis: Difficulty in walking, not elsewhere classified (R26.2);Unsteadiness on feet (R26.81);Other abnormalities of gait and mobility (R26.89)     Time: 7741-4239 PT Time Calculation (min) (ACUTE ONLY): 25 min  Charges:  $Gait Training: 8-22  mins $Therapeutic Exercise: 8-22 mins                     Moishe Spice, PT, DPT Acute Rehabilitation Services  Pager: [phone] Office: [place] 07/05/2021, 1:50 PM

## 2021-07-05 NOTE — Progress Notes (Signed)
ANTICOAGULATION CONSULT NOTE - Initial Consult  Pharmacy Consult for warfarin Indication: atrial fibrillation  Allergies  Allergen Reactions   Sulfa Antibiotics Other (See Comments)    Weakness, dizziness   Primidone     Other reaction(s): increased sedation   Simvastatin     Other reaction(s): arthralgias and fatigue, severe    Patient Measurements: Weight: 93 kg (205 lb 0.4 oz)    Vital Signs: Temp: 98 F (36.7 C) (07/07 0830) Temp Source: Oral (07/07 0830) BP: 118/66 (07/07 0830) Pulse Rate: 69 (07/07 0830)  Labs: Recent Labs    07/02/21 0902 07/02/21 1420 07/03/21 0203 07/04/21 0041 07/04/21 1004 07/05/21 0059  HGB 11.2*  --  10.4*  --  10.6*  --   HCT 34.3*  --  30.4*  --  31.9*  --   PLT 100*  --  87*  --  85*  --   APTT  --  47*  --   --   --   --   LABPROT 20.1*  --  22.4* 23.4*  --  21.2*  INR 1.7*  --  2.0* 2.1*  --  1.8*  CREATININE 1.68*  --  1.60*  --  1.71*  --      Estimated Creatinine Clearance: 30.7 mL/min (A) (by C-G formula based on SCr of 1.71 mg/dL (H)).   Medical History: Past Medical History:  Diagnosis Date   Arthritis    shoulders and back   Cancer ([place])    skin cancers   Chronic low back pain 12/15/2017   CKD (chronic kidney disease) stage 3, GFR 30-59 ml/min (HCC)    Coronary artery disease    Dysrhythmia     Paroxysmal atrial fibrillation; Tachycardia-bradycardia syndrome   Essential tremor 02/28/2016   GERD (gastroesophageal reflux disease)    Hernia    History of hiatal hernia    Hyperlipemia    Hypertension    Macular degeneration    Neuromuscular disorder (HCC)    neuropathy   Paroxysmal atrial fibrillation ([place]) 10/28/2013   Presence of permanent cardiac pacemaker 12/04/2018   Stroke ([place]) 12/2020   Tachycardia-bradycardia syndrome ([place]) 09/13/2014   TIA (transient ischemic attack)       Assessment: 87 yo M on warfarin PTA for Afib, also with hx CVA. Admit INR 1.7.   PTA Warfarin 5mg  MWF, 7.5mg  TTSS   INR  1.8 after 5mg  dose and subtherapeutic. No new interactions.   Goal of Therapy:  INR 2-3 Monitor platelets by anticoagulation protocol: Yes   Plan:   Give warfarin 10mg  x1  Monitor daily INR- when stable, decrese frequency  Monitor CBC and for signs/symptoms of bleeding  For discharge, suggest increasing to warfarin 5mg  on Mon/Fri, 7.5mg  all other days    Benetta Spar, PharmD, BCPS, [facility] Clinical Pharmacist  Please check AMION for all [place] phone numbers After 10:00 PM, call [place]

## 2021-07-05 NOTE — Consult Note (Signed)
   [facility] Inpatient Consult   07/05/2021  EMETT STAPEL 1925-04-18 [id number]  [place] [facility] [ACO] Patient: Medicare CMS DCE  Primary Care Provider:  Josetta Huddle, MD [facility], this provider is listed for the [facility] calls and follow up.  Patient screened for hospitalization with noted extreme high risk score for unplanned readmission risk and to assess for potential [facility] [facility] service needs for post hospital transition.  Review of patient's medical record reveals patient is for home with home health with 24 hour supervision as recommended by PT/OT evals. Reviewed inpatient TO RNCM progress notes for disposition and transition of care needs.   Plan: Follow for progress and disposition.  For questions contact:   Natividad Brood, RN BSN [place] [facility] Liaison  [phone] business mobile phone Toll free office [phone]  Fax number: [phone] [email or web address]

## 2021-07-06 DIAGNOSIS — I5032 Chronic diastolic (congestive) heart failure: Secondary | ICD-10-CM

## 2021-07-06 DIAGNOSIS — N179 Acute kidney failure, unspecified: Secondary | ICD-10-CM

## 2021-07-06 DIAGNOSIS — N390 Urinary tract infection, site not specified: Secondary | ICD-10-CM

## 2021-07-06 DIAGNOSIS — I10 Essential (primary) hypertension: Secondary | ICD-10-CM

## 2021-07-06 DIAGNOSIS — N189 Chronic kidney disease, unspecified: Secondary | ICD-10-CM

## 2021-07-06 DIAGNOSIS — E039 Hypothyroidism, unspecified: Secondary | ICD-10-CM

## 2021-07-06 LAB — PROTIME-INR
INR: 1.8 — ABNORMAL HIGH (ref 0.8–1.2)
Prothrombin Time: 20.9 seconds — ABNORMAL HIGH (ref 11.4–15.2)

## 2021-07-06 MED ORDER — WARFARIN SODIUM 7.5 MG PO TABS
7.5000 mg | ORAL_TABLET | Freq: Once | ORAL | Status: AC
  Administered 2021-07-06: 7.5 mg via ORAL
  Filled 2021-07-06 (×3): qty 1

## 2021-07-06 NOTE — Progress Notes (Signed)
PROGRESS NOTE    Douglas Walker  TKW:[id number] DOB: [birth year] DOA: 07/02/2021 PCP: Josetta Huddle, MD   Brief Narrative: Douglas Walker is a 87 y.o. male with a history of BPH, CVA, recurrent UTI, paroxysmal atrial fibrillation, GERD, CKD stage IIIb, hypertension, PPM placement. Patient presented secondary to fever, chills and generalized fatigue, found to have evidence of acute cystitis. Patient was started on meropenem and found to have ESBL E. Coli.    Assessment & Plan:   Active Problems:   Essential hypertension   Lower urinary tract infectious disease   Chronic diastolic congestive heart failure (HCC)   Hypothyroidism   UTI (urinary tract infection)   AKI (acute kidney injury) ([place])   UTI Recurrent issue. Patient follows with urology as an outpatient and is on prophylactic nitrofurantoin. Urine culture significant for ESBL E. Coli. Patient was initially treated empirically with meropenem, and then given a dose of fosfomycin on 7/7. Discussed with ID who recommended transition to Bactrim on discharge to complete total course.  BPH Patient is on finasteride and tamsulosin as an outpatient. Follows with urology closely.   Scrotal pain Scrotal ultrasound significant for no acute abnormality. Ultrasound did identify 3 mm left testicular and 5 mm left epididymal head cysts; likely incidental.  Acute metabolic encephalopathy In setting of infection. Resolved.  Paroxysmal atrial fibrillation Patient is on coumadin as an outpatient. Not on rate control medication. -Continue Coumadin  Chronic diastolic heart failure Stable. Patient is Lasix as an outpatient. Euvolemic. -Continue Lasix  Hypothyroidism -Continue Synthroid  Mood disorder -Continue Topamax, Cymbalta, Lyrica  AKI on CKD stage IIIb Baseline creatinine of 1.3. Peak creatinine of 1.71. AKI now resolved with creatinine of 1.48, back towards baseline.  Chronic thrombocytopenia Stable  DVT  prophylaxis: Coumadin Code Status:   Code Status: DNR Family Communication: Son and daughter-in-law at bedside Disposition Plan: Discharge home likely in 24 hours   Consultants:  None  Procedures:  None  Antimicrobials: Meropenem IV Fosfomycin    Subjective: No abdominal pain. No concerns today.  Objective: Vitals:   07/06/21 0030 07/06/21 0508 07/06/21 0735 07/06/21 1147  BP: 120/65 132/74 (!) 154/78 (!) 147/70  Pulse: 70 68 70 70  Resp: 19 18 18 18   Temp: 98.6 F (37 C) 98 F (36.7 C) 98 F (36.7 C) 97.6 F (36.4 C)  TempSrc: Oral Oral Oral Oral  SpO2: 96% 92% 94% 96%  Weight:        Intake/Output Summary (Last 24 hours) at 07/06/2021 1610 Last data filed at 07/06/2021 1402 Gross per 24 hour  Intake 120 ml  Output 1700 ml  Net -1580 ml   Filed Weights   07/02/21 1500  Weight: 93 kg    Examination:  General exam: Appears calm and comfortable  Respiratory system: Clear to auscultation. Respiratory effort normal. Cardiovascular system: S1 & S2 heard, RRR. No murmurs, rubs, gallops or clicks. Gastrointestinal system: Abdomen is nondistended, soft and nontender. No organomegaly or masses felt. Normal bowel sounds heard. Central nervous system: Alert. No focal neurological deficits. Hard of hearing. Musculoskeletal: No edema. No calf tenderness Skin: No cyanosis. No rashes Psychiatry: Judgement and insight appear normal. Mood & affect appropriate.     Data Reviewed: I have personally reviewed following labs and imaging studies  CBC Lab Results  Component Value Date   WBC 7.4 07/05/2021   RBC 3.10 (L) 07/05/2021   HGB 10.1 (L) 07/05/2021   HCT 30.4 (L) 07/05/2021   MCV 98.1 07/05/2021   MCH  32.6 07/05/2021   PLT 84 (L) 07/05/2021   MCHC 33.2 07/05/2021   RDW 15.4 07/05/2021   LYMPHSABS 0.8 07/04/2021   MONOABS 1.4 (H) 07/04/2021   EOSABS 0.1 07/04/2021   BASOSABS 0.0 97/98/9211     Last metabolic panel Lab Results  Component Value Date   NA  139 07/05/2021   K 3.7 07/05/2021   CL 104 07/05/2021   CO2 26 07/05/2021   BUN 32 (H) 07/05/2021   CREATININE 1.48 (H) 07/05/2021   GLUCOSE 102 (H) 07/05/2021   GFRNONAA 44 (L) 07/05/2021   GFRAA 45 (L) 08/24/2020   CALCIUM 8.6 (L) 07/05/2021   PHOS 3.8 02/01/2021   PROT 6.3 (L) 02/12/2021   ALBUMIN 3.2 (L) 02/12/2021   BILITOT 0.8 02/12/2021   ALKPHOS 117 02/12/2021   AST 23 02/12/2021   ALT 32 02/12/2021   ANIONGAP 9 07/05/2021    CBG (last 3)  No results for input(s): GLUCAP in the last 72 hours.   GFR: Estimated Creatinine Clearance: 35.4 mL/min (A) (by C-G formula based on SCr of 1.48 mg/dL (H)).  Coagulation Profile: Recent Labs  Lab 07/02/21 0902 07/03/21 0203 07/04/21 0041 07/05/21 0059 07/06/21 0134  INR 1.7* 2.0* 2.1* 1.8* 1.8*    Recent Results (from the past 240 hour(s))  Resp Panel by RT-PCR (Flu A&B, Covid) Nasopharyngeal Swab     Status: None   Collection Time: 07/02/21 10:17 AM   Specimen: Nasopharyngeal Swab; Nasopharyngeal(NP) swabs in vial transport medium  Result Value Ref Range Status   SARS Coronavirus 2 by RT PCR NEGATIVE NEGATIVE Final    Comment: (NOTE) SARS-CoV-2 target nucleic acids are NOT DETECTED.  The SARS-CoV-2 RNA is generally detectable in upper respiratory specimens during the acute phase of infection. The lowest concentration of SARS-CoV-2 viral copies this assay can detect is 138 copies/mL. A negative result does not preclude SARS-Cov-2 infection and should not be used as the sole basis for treatment or other patient management decisions. A negative result may occur with  improper specimen collection/handling, submission of specimen other than nasopharyngeal swab, presence of viral mutation(s) within the areas targeted by this assay, and inadequate number of viral copies(<138 copies/mL). A negative result must be combined with clinical observations, patient history, and epidemiological information. The expected result is  Negative.  Fact Sheet for Patients:  EntrepreneurPulse.com.au  Fact Sheet for Healthcare Providers:  IncredibleEmployment.be  This test is no t yet approved or cleared by the [place] FDA and  has been authorized for detection and/or diagnosis of SARS-CoV-2 by FDA under an Emergency Use Authorization (EUA). This EUA will remain  in effect (meaning this test can be used) for the duration of the COVID-19 declaration under Section 564(b)(1) of the Act, 21 U.S.C.section 360bbb-3(b)(1), unless the authorization is terminated  or revoked sooner.       Influenza A by PCR NEGATIVE NEGATIVE Final   Influenza B by PCR NEGATIVE NEGATIVE Final    Comment: (NOTE) The Xpert Xpress SARS-CoV-2/FLU/RSV plus assay is intended as an aid in the diagnosis of influenza from Nasopharyngeal swab specimens and should not be used as a sole basis for treatment. Nasal washings and aspirates are unacceptable for Xpert Xpress SARS-CoV-2/FLU/RSV testing.  Fact Sheet for Patients: EntrepreneurPulse.com.au  Fact Sheet for Healthcare Providers: IncredibleEmployment.be  This test is not yet approved or cleared by the [place] FDA and has been authorized for detection and/or diagnosis of SARS-CoV-2 by FDA under an Emergency Use Authorization (EUA). This EUA will remain in  effect (meaning this test can be used) for the duration of the COVID-19 declaration under Section 564(b)(1) of the Act, 21 U.S.C. section 360bbb-3(b)(1), unless the authorization is terminated or revoked.  Performed at [facility], [place] [address] [place], [place] [address]   Blood culture (routine x 2)     Status: None (Preliminary result)   Collection Time: 07/02/21 12:21 PM   Specimen: BLOOD  Result Value Ref Range Status   Specimen Description BLOOD RIGHT ANTECUBITAL  Final   Special Requests   Final    BOTTLES DRAWN AEROBIC AND ANAEROBIC Blood  Culture adequate volume   Culture   Final    NO GROWTH 4 DAYS Performed at [facility], [place] [address] [place], [place] [address]    Report Status PENDING  Incomplete  Blood culture (routine x 2)     Status: None (Preliminary result)   Collection Time: 07/02/21 12:26 PM   Specimen: BLOOD  Result Value Ref Range Status   Specimen Description BLOOD LEFT ANTECUBITAL  Final   Special Requests   Final    BOTTLES DRAWN AEROBIC AND ANAEROBIC Blood Culture results may not be optimal due to an inadequate volume of blood received in culture bottles   Culture   Final    NO GROWTH 4 DAYS Performed at [facility], [place] [address] [place], [place] [address]    Report Status PENDING  Incomplete  Urine culture     Status: Abnormal   Collection Time: 07/02/21 12:46 PM   Specimen: Urine, Random  Result Value Ref Range Status   Specimen Description URINE, RANDOM  Final   Special Requests   Final    NONE Performed at [facility], [place] [address] [place],  [address]    Culture (A)  Final    >=100,000 COLONIES/mL ESCHERICHIA COLI Confirmed Extended Spectrum Beta-Lactamase Producer (ESBL).  In bloodstream infections from ESBL organisms, carbapenems are preferred over piperacillin/tazobactam. They are shown to have a lower risk of mortality.    Report Status 07/04/2021 FINAL  Final   Organism ID, Bacteria ESCHERICHIA COLI (A)  Final      Susceptibility   Escherichia coli - MIC*    AMPICILLIN >=32 RESISTANT Resistant     CEFAZOLIN >=64 RESISTANT Resistant     CEFEPIME 4 INTERMEDIATE Intermediate     CEFTRIAXONE >=64 RESISTANT Resistant     CIPROFLOXACIN >=4 RESISTANT Resistant     GENTAMICIN <=1 SENSITIVE Sensitive     IMIPENEM <=0.25 SENSITIVE Sensitive     NITROFURANTOIN 64 INTERMEDIATE Intermediate     TRIMETH/SULFA <=20 SENSITIVE Sensitive     AMPICILLIN/SULBACTAM 16 INTERMEDIATE Intermediate     PIP/TAZO <=4 SENSITIVE Sensitive     * >=100,000 COLONIES/mL  ESCHERICHIA COLI        Radiology Studies: No results found.      Scheduled Meds:  DULoxetine  20 mg Oral Daily   finasteride  5 mg Oral Daily   levothyroxine  25 mcg Oral QAC breakfast   multivitamin with minerals  1 tablet Oral Daily   polyethylene glycol  17 g Oral Daily   pregabalin  25 mg Oral QHS   primidone  250 mg Oral BID   tamsulosin  0.4 mg Oral BID   topiramate  50 mg Oral BID   vitamin B-12  1,000 mcg Oral QODAY   warfarin  7.5 mg Oral ONCE-1600   Warfarin - Pharmacist Dosing Inpatient   Does not apply [id number]  Continuous Infusions:   LOS: 4 days     Cordelia Poche, MD [facility] 07/06/2021, 4:10 PM  If 7PM-7AM, please contact night-coverage [email or web address]

## 2021-07-06 NOTE — Progress Notes (Signed)
ANTICOAGULATION CONSULT NOTE - Initial Consult  Pharmacy Consult for warfarin Indication: atrial fibrillation  Allergies  Allergen Reactions   Sulfa Antibiotics Other (See Comments)    Weakness, dizziness   Primidone     Other reaction(s): increased sedation   Simvastatin     Other reaction(s): arthralgias and fatigue, severe    Patient Measurements: Weight: 93 kg (205 lb 0.4 oz)    Vital Signs: Temp: 98 F (36.7 C) (07/08 0735) Temp Source: Oral (07/08 0735) BP: 154/78 (07/08 0735) Pulse Rate: 70 (07/08 0735)  Labs: Recent Labs    07/04/21 0041 07/04/21 1004 07/05/21 0059 07/05/21 1040 07/06/21 0134  HGB  --  10.6*  --  10.1*  --   HCT  --  31.9*  --  30.4*  --   PLT  --  85*  --  84*  --   LABPROT 23.4*  --  21.2*  --  20.9*  INR 2.1*  --  1.8*  --  1.8*  CREATININE  --  1.71*  --  1.48*  --      Estimated Creatinine Clearance: 35.4 mL/min (A) (by C-G formula based on SCr of 1.48 mg/dL (H)).   Medical History: Past Medical History:  Diagnosis Date   Arthritis    shoulders and back   Cancer ([place])    skin cancers   Chronic low back pain 12/15/2017   CKD (chronic kidney disease) stage 3, GFR 30-59 ml/min (HCC)    Coronary artery disease    Dysrhythmia     Paroxysmal atrial fibrillation; Tachycardia-bradycardia syndrome   Essential tremor 02/28/2016   GERD (gastroesophageal reflux disease)    Hernia    History of hiatal hernia    Hyperlipemia    Hypertension    Macular degeneration    Neuromuscular disorder (HCC)    neuropathy   Paroxysmal atrial fibrillation ([place]) 10/28/2013   Presence of permanent cardiac pacemaker 12/04/2018   Stroke ([place]) 12/2020   Tachycardia-bradycardia syndrome ([place]) 09/13/2014   TIA (transient ischemic attack)       Assessment: 87 yo M on warfarin PTA for Afib, also with hx CVA. Admit INR 1.7.   PTA Warfarin 5mg  MWF, 7.5mg  TTSS   INR 1.8 after 10 mg dose and subtherapeutic. No new interactions.   Goal of Therapy:   INR 2-3 Monitor platelets by anticoagulation protocol: Yes   Plan:   Give warfarin 7.5 mg Monitor daily INR- when stable, decrese frequency  Monitor CBC and for signs/symptoms of bleeding  For discharge, suggest increasing to warfarin 5mg  on Mon/Fri, 7.5mg  all other days    Varney Daily, PharmD PGY1 [place] Resident  Please check AMION for all [facility] pharmacy phone numbers After 10:00 PM call main pharmacy [phone]

## 2021-07-07 LAB — PROTIME-INR
INR: 1.8 — ABNORMAL HIGH (ref 0.8–1.2)
Prothrombin Time: 20.8 seconds — ABNORMAL HIGH (ref 11.4–15.2)

## 2021-07-07 LAB — CULTURE, BLOOD (ROUTINE X 2)
Culture: NO GROWTH
Culture: NO GROWTH
Special Requests: ADEQUATE

## 2021-07-07 MED ORDER — POLYETHYLENE GLYCOL 3350 17 G PO PACK: 17.0000 g | PACK | Freq: Every day | ORAL | 0 refills | Status: DC

## 2021-07-07 MED ORDER — FOSFOMYCIN TROMETHAMINE 3 G PO PACK
3.0000 g | PACK | Freq: Once | ORAL | Status: AC
  Administered 2021-07-07: 3 g via ORAL
  Filled 2021-07-07: qty 3

## 2021-07-07 MED ORDER — WARFARIN SODIUM 5 MG PO TABS
10.0000 mg | ORAL_TABLET | Freq: Once | ORAL | Status: AC
  Administered 2021-07-07: 10 mg via ORAL
  Filled 2021-07-07: qty 2

## 2021-07-07 NOTE — Progress Notes (Signed)
Physical Therapy Treatment Patient Details Name: Douglas Walker MRN: [id number] DOB: 1925-06-08 Today's Date: 07/07/2021    History of Present Illness 87 y.o. male admitted with UTI, weakness, confusion, abdominal pain. PMH includes CKD, HTN, macular degeneration, CVA, OA.    PT Comments    Patient did well with ambulation (4 laps in room due to isolation--family feels this is equal to distance he will walk from car to his bedroom). Son and daughter in law present and feel comfortable taking pt home. MD and RN notified.     Follow Up Recommendations  Home health PT;Supervision/Assistance - 24 hour     Equipment Recommendations  None recommended by PT    Recommendations for Other Services       Precautions / Restrictions Precautions Precautions: Fall Precaution Comments: family reports fell day of admission    Mobility  Bed Mobility                    Transfers Overall transfer level: Needs assistance Equipment used: Rolling walker (2 wheeled) Transfers: Sit to/from Stand Sit to Stand: Min guard         General transfer comment: from toilet  Ambulation/Gait Ambulation/Gait assistance: Min guard Gait Distance (Feet): 195 Feet Assistive device: Rolling walker (2 wheeled) Gait Pattern/deviations: Step-through pattern;Decreased stride length;Shuffle Gait velocity: decr   General Gait Details: pt with tremors (primarily chin, UEs) but with good strength in legs   Stairs             Wheelchair Mobility    Modified Rankin (Stroke Patients Only)       Balance Overall balance assessment: Needs assistance   Sitting balance-Leahy Scale: Good     Standing balance support: Bilateral upper extremity supported Standing balance-Leahy Scale: Poor Standing balance comment: Reliant on UE support.                            Cognition Arousal/Alertness: Awake/alert Behavior During Therapy: WFL for tasks assessed/performed Overall  Cognitive Status: Difficult to assess                                 General Comments: Pt following commands with increased time.      Exercises      General Comments General comments (skin integrity, edema, etc.): Son and DIL presnt during session; agree his strength in walking is better and they can manage pt at home      Pertinent Vitals/Pain Pain Assessment: Faces Faces Pain Scale: Hurts little more Pain Location: bil LEs neuropathy Pain Descriptors / Indicators: Sore;Tingling Pain Intervention(s): Limited activity within patient's tolerance;Repositioned    Home Living Family/patient expects to be discharged to:: Private residence Living Arrangements: Children                  Prior Function            PT Goals (current goals can now be found in the care plan section) Acute Rehab PT Goals Patient Stated Goal: to go home with family PT Goal Formulation: With patient/family Time For Goal Achievement: 07/17/21 Potential to Achieve Goals: Good Progress towards PT goals: Progressing toward goals    Frequency    Min 3X/week      PT Plan Current plan remains appropriate    Co-evaluation              AM-PAC PT "6  Clicks" Mobility   Outcome Measure  Help needed turning from your back to your side while in a flat bed without using bedrails?: A Little Help needed moving from lying on your back to sitting on the side of a flat bed without using bedrails?: A Little Help needed moving to and from a bed to a chair (including a wheelchair)?: A Little Help needed standing up from a chair using your arms (e.g., wheelchair or bedside chair)?: A Little Help needed to walk in hospital room?: A Little Help needed climbing 3-5 steps with a railing? : A Little 6 Click Score: 18    End of Session   Activity Tolerance: Patient tolerated treatment well Patient left: in chair;with call bell/phone within reach;with family/visitor present Nurse  Communication: Mobility status;Other (comment) (ok to d/c per PT/mobility standpoint) PT Visit Diagnosis: Difficulty in walking, not elsewhere classified (R26.2);Unsteadiness on feet (R26.81);Other abnormalities of gait and mobility (R26.89)     Time: 8850-2774 PT Time Calculation (min) (ACUTE ONLY): 31 min  Charges:  $Gait Training: 8-22 mins (time pt in bathroom subtracted for calculating charges)                    Arby Barrette, PT Pager [phone]    Jeanie Cooks Mckensie Scotti 07/07/2021, 2:18 PM

## 2021-07-07 NOTE — Discharge Summary (Signed)
Physician Discharge Summary  [name] XNT:[id number] DOB: 15-Jun-1925 DOA: 07/02/2021  PCP: Josetta Huddle, MD  Admit date: 07/02/2021 Discharge date: 07/07/2021  Admitted From: Home Disposition: Home  Recommendations for Outpatient Follow-up:  Follow up with PCP in 1 week Follow up with urology Please follow up on the following pending results: None  Home Health: PT/OT Equipment/Devices: None  Discharge Condition: Stable CODE STATUS: DNR Diet recommendation: Salt restricted diet   Brief/Interim Summary:  Admission HPI written by Lequita Halt, MD   HPI: Douglas Walker is a 87 y.o. male with medical history significant of recurrent ESBL UTI, poorly controlled BPH, CVA and PAF on Coumadin, CKD stage III, SSS s/p PPM, hypothyroidism presented with worsening of bilateral scrotum pain and urinary frequency.   Patient confused, most history provided by patient daughter and son over the phone.  Family reported that patient has had multiple of ESBL UTI since last year, and was started on suppressive nitrofurantoin once a day, by urology on June 07 2021.  Despite, patient continued to have urinary frequency, and woke up at night 3-4 times to urinate.  Last few days, patient has complained about bilateral scrotum pain "my testicles are bigger" and patient thought he had hernia, but son did not feel any hernia with home exam.  Moreover, patient complained lower abdominal pain and developed generalized weakness.  No focal, no fever at home.  Chronic constipated, has been eating and drinking well denies any chest pain or shortness of breath.   Hospital course:  Complicated UTI Associated fever. Recurrent issue. Patient follows with urology as an outpatient and is on prophylactic nitrofurantoin. Urine culture significant for ESBL E. Coli. Patient was initially treated empirically with meropenem for three days, and then given a dose of fosfomycin on 7/7. Patient given one more dose of  fosfomycin on 7/9 to treat for a total of 8 days. Blood cultures negative.   BPH Patient is on finasteride and tamsulosin as an outpatient. Follows with urology closely.   Scrotal pain Scrotal ultrasound significant for no acute abnormality. Ultrasound did identify 3 mm left testicular and 5 mm left epididymal head cysts; likely incidental.   Acute metabolic encephalopathy In setting of infection. Resolved.   Paroxysmal atrial fibrillation Patient is on coumadin as an outpatient. Not on rate control medication. Continue Coumadin   Chronic diastolic heart failure Stable. Patient is Lasix as an outpatient. Euvolemic. Continue Lasix   Hypothyroidism Continue Synthroid   Mood disorder Continue Topamax, Cymbalta, Lyrica   AKI on CKD stage IIIb Baseline creatinine of 1.3. Peak creatinine of 1.71. AKI now resolved with creatinine of 1.48, back towards baseline.   Chronic thrombocytopenia Stable  Discharge Diagnoses:  Active Problems:   Essential hypertension   Lower urinary tract infectious disease   Chronic diastolic congestive heart failure (HCC)   Hypothyroidism   UTI (urinary tract infection)   AKI (acute kidney injury) ([place])    Discharge Instructions   Allergies as of 07/07/2021       Reactions   Sulfa Antibiotics Other (See Comments)   Weakness, dizziness   Primidone    Other reaction(s): increased sedation   Simvastatin    Other reaction(s): arthralgias and fatigue, severe        Medication List     TAKE these medications    acetaminophen 325 MG tablet Commonly known as: TYLENOL Take 2 tablets (650 mg total) by mouth every 4 (four) hours as needed for mild pain (or temp >  37.5 C (99.5 F)).   DULoxetine 60 MG capsule Commonly known as: Cymbalta Take 1 capsule (60 mg total) by mouth daily.   finasteride 5 MG tablet Commonly known as: PROSCAR Take 1 tablet (5 mg total) by mouth daily.   levothyroxine 25 MCG tablet Commonly known as: SYNTHROID Take  1 tablet (25 mcg total) by mouth daily before breakfast.   loratadine 10 MG tablet Commonly known as: CLARITIN Take 10 mg by mouth daily.   multivitamin with minerals Tabs tablet Take 1 tablet by mouth daily.   nitrofurantoin (macrocrystal-monohydrate) 100 MG capsule Commonly known as: MACROBID Take 100 mg by mouth daily.   Ocuvite Adult 50+ Caps Take 1 capsule by mouth daily.   polyethylene glycol 17 g packet Commonly known as: MIRALAX / GLYCOLAX Take 17 g by mouth daily. Start taking on: July 08, 2021   pregabalin 25 MG capsule Commonly known as: LYRICA TAKE 1 CAPSULE(25 MG) BY MOUTH AT BEDTIME   primidone 250 MG tablet Commonly known as: MYSOLINE Take 1 tablet (250 mg total) by mouth 2 (two) times daily.   senna 8.6 MG tablet Commonly known as: SENOKOT Take 1 tablet by mouth daily as needed for constipation.   tamsulosin 0.4 MG Caps capsule Commonly known as: FLOMAX Take 1 capsule (0.4 mg total) by mouth 2 (two) times daily.   topiramate 50 MG tablet Commonly known as: TOPAMAX Take 1 tablet (50 mg total) by mouth 2 (two) times daily.   traMADol 50 MG tablet Commonly known as: ULTRAM Take 1 tablet (50 mg total) by mouth every 6 (six) hours as needed for severe pain (if tylenol not effective).   vitamin B-12 1000 MCG tablet Commonly known as: CYANOCOBALAMIN Take 1 tablet (1,000 mcg total) by mouth every other day.       ASK your doctor about these medications    furosemide 20 MG tablet Commonly known as: LASIX Take 2 tablets (40 mg total) by mouth daily.   warfarin 5 MG tablet Commonly known as: COUMADIN Take as directed. If you are unsure how to take this medication, talk to your nurse or doctor. Original instructions: Take 1 to 1.5 tablets daily as directed by Coumadin clinic        Follow-up Information     [facility] Follow up.   Specialty: Home Health Services Contact information: [place]  [phone]         Josetta Huddle, MD. Schedule an appointment as soon as possible for a visit.   Specialty: Internal Medicine Why: For hospital follow-up Contact information: [address] [place] [address] [phone]         Ceasar Mons, MD. Schedule an appointment as soon as possible for a visit in 1 week(s).   Specialty: Urology Why: For hospital follow-up Contact information: [address] [place] [address] [phone]                Allergies  Allergen Reactions   Sulfa Antibiotics Other (See Comments)    Weakness, dizziness   Primidone     Other reaction(s): increased sedation   Simvastatin     Other reaction(s): arthralgias and fatigue, severe    Consultations: None   Procedures/Studies: DG Chest 2 View  Result Date: 07/02/2021 CLINICAL DATA:  Weakness. EXAM: CHEST - 2 VIEW COMPARISON:  01/28/2021. FINDINGS: Cardiac silhouette is borderline enlarged. No mediastinal or hilar masses. No evidence of adenopathy.  There are opacities in the lung bases, similar to the prior exams, consistent with atelectasis/scarring. Remainder of the lungs is clear. No pleural effusion or pneumothorax. Left anterior chest wall sequential pacemaker is stable. Skeletal structures are demineralized, grossly intact. IMPRESSION: No acute cardiopulmonary disease. Electronically Signed   By: Lajean Manes M.D.   On: 07/02/2021 09:33   CT Head Wo Contrast  Result Date: 07/02/2021 CLINICAL DATA:  Head trauma.  Weakness. EXAM: CT HEAD WITHOUT CONTRAST TECHNIQUE: Contiguous axial images were obtained from the base of the skull through the vertex without intravenous contrast. COMPARISON:  01/28/2021 FINDINGS: Brain: Left frontal lobe encephalomalacia in the MCA distribution corresponding to the geographic region of involvement shown on perfusion brain study of 01/28/2021. Periventricular white matter and corona radiata hypodensities  favor chronic ischemic microvascular white matter disease. Otherwise, the brainstem, cerebellum, cerebral peduncles, thalamus, basal ganglia, basilar cisterns, and ventricular system appear within normal limits. No intracranial hemorrhage, mass lesion, or acute CVA. Vascular: There is atherosclerotic calcification of the cavernous carotid arteries bilaterally. Skull: Unremarkable Sinuses/Orbits: Unremarkable Other: No supplemental non-categorized findings. IMPRESSION: 1. No acute intracranial findings. 2. Encephalomalacia in the left MCA distribution corresponding to the region of involvement shown on perfusion brain study of 01/28/2021. 3. Periventricular white matter and corona radiata hypodensities favor chronic ischemic microvascular white matter disease. Electronically Signed   By: Van Clines M.D.   On: 07/02/2021 11:58   CT ABDOMEN PELVIS W CONTRAST  Result Date: 07/02/2021 CLINICAL DATA:  Abdominal trauma, weakness. EXAM: CT ABDOMEN AND PELVIS WITH CONTRAST TECHNIQUE: Multidetector CT imaging of the abdomen and pelvis was performed using the standard protocol following bolus administration of intravenous contrast. CONTRAST:  24mL OMNIPAQUE IOHEXOL 300 MG/ML  SOLN COMPARISON:  05/01/2021 FINDINGS: Despite efforts by the technologist and patient, motion artifact is present on today's exam and could not be eliminated. This reduces exam sensitivity and specificity. Lower chest: Dependent subsegmental atelectasis in both lower lobes. Aortic and coronary atherosclerosis. Dual lead pacer in place. Mild cardiomegaly. Mild gynecomastia. Hepatobiliary: Small hypodense lesions in the liver are technically nonspecific although unchanged and statistically likely to be benign cysts or similar benign lesions. Punctate calcifications in the liver compatible with old granulomatous disease. No biliary dilatation. Gallbladder unremarkable. Pancreas: Unremarkable Spleen: Unremarkable Adrenals/Urinary Tract: Both  adrenal glands appear normal. 0.9 by 0.7 cm hypodense lesion medially in the right kidney upper pole is similar to the prior exam probably a cyst but technically too small to characterize. 3.0 by 2.5 cm fluid density lesion in the left mid kidney posteriorly compatible with cyst. Small suspected cysts of the right kidney lower pole noted. Multiple bladder cellules are present. Mild urinary bladder wall thickening may be from nondistention although cystitis is not excluded and correlation with urine analysis is suggested. Stomach/Bowel: Scattered diverticula of the terminal ileum. Sigmoid colon diverticulosis without active diverticulitis identified. Vascular/Lymphatic: Aortoiliac atherosclerotic vascular disease. Reproductive: Dystrophic calcifications in the prostate gland. Other: No supplemental non-categorized findings. Musculoskeletal: Mild grade 1 degenerative anterolisthesis at L3-4. Thoracolumbar spondylosis and degenerative disc disease causing multilevel impingement. IMPRESSION: 1. No acute posttraumatic findings in the abdomen. 2. Bladder cellules with mild wall thickening of the urinary bladder, cellulitis not excluded. 3. Other imaging findings of potential clinical significance: Aortic Atherosclerosis (ICD10-I70.0). Mild cardiomegaly. Dual lead pacer. Hepatic and renal cysts. Sigmoid colon diverticulosis without active diverticulitis. Scattered diverticula of the sigmoid colon. Multilevel lumbar impingement. Electronically Signed   By: Van Clines M.D.   On: 07/02/2021 11:53   US  SCROTUM W/DOPPLER  Result Date: 07/02/2021 CLINICAL DATA:  87 year old with testicular pain. EXAM: SCROTAL ULTRASOUND DOPPLER ULTRASOUND OF THE TESTICLES TECHNIQUE: Complete ultrasound examination of the testicles, epididymis, and other scrotal structures was performed. Color and spectral Doppler ultrasound were also utilized to evaluate blood flow to the testicles. COMPARISON:  None. FINDINGS: Right testicle  Measurements: 3.5 x 2.1 x 3.8 cm. Homogeneous echogenicity. Normal blood flow. No mass or microlithiasis visualized. Left testicle Measurements: 4.0 x 2.0 x 3.2 cm. Rete testis noted. 3 mm peripheral intra testicular cyst. Single testicular calcification. No solid lesion. Normal blood flow. Right epididymis:  Not well delineated on the current exam. Left epididymis: Epididymal head cyst measures 5 mm. Otherwise normal. Hydrocele:  None visualized. Varicocele:  None visualized. Pulsed Doppler interrogation of both testes demonstrates normal low resistance arterial and venous waveforms bilaterally. IMPRESSION: 1. No explanation for pain. 2. A 3 mm left testicular cyst as well as 5 mm left epididymal head cyst are likely incidental and of doubtful clinical significance. Electronically Signed   By: Keith Rake M.D.   On: 07/02/2021 16:19      Subjective: No concerns this morning.  Discharge Exam: Vitals:   07/07/21 0501 07/07/21 0818  BP: 122/63 120/64  Pulse: 70 69  Resp: 17 18  Temp: 98.3 F (36.8 C) 98.3 F (36.8 C)  SpO2: 96% 97%   Vitals:   07/06/21 2103 07/07/21 0023 07/07/21 0501 07/07/21 0818  BP: (!) 142/75 122/69 122/63 120/64  Pulse: 71 70 70 69  Resp: 17 17 17 18   Temp: 98.6 F (37 C) 98.6 F (37 C) 98.3 F (36.8 C) 98.3 F (36.8 C)  TempSrc: Oral Oral Oral Oral  SpO2: 94% 94% 96% 97%  Weight:        General: Pt is alert, awake, not in acute distress Cardiovascular: RRR, S1/S2 +, no rubs, no gallops Respiratory: CTA bilaterally, no wheezing, no rhonchi Abdominal: Soft, NT, ND, bowel sounds + Extremities:  no cyanosis    The results of significant diagnostics from this hospitalization (including imaging, microbiology, ancillary and laboratory) are listed below for reference.     Microbiology: Recent Results (from the past 240 hour(s))  Resp Panel by RT-PCR (Flu A&B, Covid) Nasopharyngeal Swab     Status: None   Collection Time: 07/02/21 10:17 AM   Specimen:  Nasopharyngeal Swab; Nasopharyngeal(NP) swabs in vial transport medium  Result Value Ref Range Status   SARS Coronavirus 2 by RT PCR NEGATIVE NEGATIVE Final    Comment: (NOTE) SARS-CoV-2 target nucleic acids are NOT DETECTED.  The SARS-CoV-2 RNA is generally detectable in upper respiratory specimens during the acute phase of infection. The lowest concentration of SARS-CoV-2 viral copies this assay can detect is 138 copies/mL. A negative result does not preclude SARS-Cov-2 infection and should not be used as the sole basis for treatment or other patient management decisions. A negative result may occur with  improper specimen collection/handling, submission of specimen other than nasopharyngeal swab, presence of viral mutation(s) within the areas targeted by this assay, and inadequate number of viral copies(<138 copies/mL). A negative result must be combined with clinical observations, patient history, and epidemiological information. The expected result is Negative.  Fact Sheet for Patients:  EntrepreneurPulse.com.au  Fact Sheet for Healthcare Providers:  IncredibleEmployment.be  This test is no t yet approved or cleared by the [place] FDA and  has been authorized for detection and/or diagnosis of SARS-CoV-2 by FDA under an Emergency Use Authorization (EUA). This EUA will remain  in effect (meaning this test can be used) for the duration of the COVID-19 declaration under Section 564(b)(1) of the Act, 21 U.S.C.section 360bbb-3(b)(1), unless the authorization is terminated  or revoked sooner.       Influenza A by PCR NEGATIVE NEGATIVE Final   Influenza B by PCR NEGATIVE NEGATIVE Final    Comment: (NOTE) The Xpert Xpress SARS-CoV-2/FLU/RSV plus assay is intended as an aid in the diagnosis of influenza from Nasopharyngeal swab specimens and should not be used as a sole basis for treatment. Nasal washings and aspirates are unacceptable for  Xpert Xpress SARS-CoV-2/FLU/RSV testing.  Fact Sheet for Patients: EntrepreneurPulse.com.au  Fact Sheet for Healthcare Providers: IncredibleEmployment.be  This test is not yet approved or cleared by the [place] FDA and has been authorized for detection and/or diagnosis of SARS-CoV-2 by FDA under an Emergency Use Authorization (EUA). This EUA will remain in effect (meaning this test can be used) for the duration of the COVID-19 declaration under Section 564(b)(1) of the Act, 21 U.S.C. section 360bbb-3(b)(1), unless the authorization is terminated or revoked.  Performed at [facility], [place] [address], [place], [place] [address]   Blood culture (routine x 2)     Status: None   Collection Time: 07/02/21 12:21 PM   Specimen: BLOOD  Result Value Ref Range Status   Specimen Description BLOOD RIGHT ANTECUBITAL  Final   Special Requests   Final    BOTTLES DRAWN AEROBIC AND ANAEROBIC Blood Culture adequate volume   Culture   Final    NO GROWTH 5 DAYS Performed at [facility], [place] [address], [place], [place] [address]    Report Status 07/07/2021 FINAL  Final  Blood culture (routine x 2)     Status: None   Collection Time: 07/02/21 12:26 PM   Specimen: BLOOD  Result Value Ref Range Status   Specimen Description BLOOD LEFT ANTECUBITAL  Final   Special Requests   Final    BOTTLES DRAWN AEROBIC AND ANAEROBIC Blood Culture results may not be optimal due to an inadequate volume of blood received in culture bottles   Culture   Final    NO GROWTH 5 DAYS Performed at [facility], [place] [address] [place], [place] [address]    Report Status 07/07/2021 FINAL  Final  Urine culture     Status: Abnormal   Collection Time: 07/02/21 12:46 PM   Specimen: Urine, Random  Result Value Ref Range Status   Specimen Description URINE, RANDOM  Final   Special Requests   Final    NONE Performed at [facility], [place] [address]  [place], [place] [address]    Culture (A)  Final    >=100,000 COLONIES/mL ESCHERICHIA COLI Confirmed Extended Spectrum Beta-Lactamase Producer (ESBL).  In bloodstream infections from ESBL organisms, carbapenems are preferred over piperacillin/tazobactam. They are shown to have a lower risk of mortality.    Report Status 07/04/2021 FINAL  Final   Organism ID, Bacteria ESCHERICHIA COLI (A)  Final      Susceptibility   Escherichia coli - MIC*    AMPICILLIN >=32 RESISTANT Resistant     CEFAZOLIN >=64 RESISTANT Resistant     CEFEPIME 4 INTERMEDIATE Intermediate     CEFTRIAXONE >=64 RESISTANT Resistant     CIPROFLOXACIN >=4 RESISTANT Resistant     GENTAMICIN <=1 SENSITIVE Sensitive     IMIPENEM <=0.25 SENSITIVE Sensitive     NITROFURANTOIN 64 INTERMEDIATE Intermediate     TRIMETH/SULFA <=20 SENSITIVE  Sensitive     AMPICILLIN/SULBACTAM 16 INTERMEDIATE Intermediate     PIP/TAZO <=4 SENSITIVE Sensitive     * >=100,000 COLONIES/mL ESCHERICHIA COLI     Labs: BNP (last 3 results) Recent Labs    08/24/20 1124 01/29/21 0237  BNP 341.4* 268.3*   Basic Metabolic Panel: Recent Labs  Lab 07/02/21 0902 07/03/21 0203 07/04/21 1004 07/05/21 1040  NA 137 136 138 139  K 4.3 3.7 3.8 3.7  CL 102 103 104 104  CO2 28 25 26 26   GLUCOSE 107* 94 142* 102*  BUN 26* 28* 29* 32*  CREATININE 1.68* 1.60* 1.71* 1.48*  CALCIUM 9.0 8.5* 8.5* 8.6*  MG  --   --  2.2  --    Liver Function Tests: No results for input(s): AST, ALT, ALKPHOS, BILITOT, PROT, ALBUMIN in the last 168 hours. No results for input(s): LIPASE, AMYLASE in the last 168 hours. No results for input(s): AMMONIA in the last 168 hours. CBC: Recent Labs  Lab 07/02/21 0902 07/03/21 0203 07/04/21 1004 07/05/21 1040  WBC 10.9* 9.9 10.6* 7.4  NEUTROABS  --  6.9 8.2*  --   HGB 11.2* 10.4* 10.6* 10.1*  HCT 34.3* 30.4* 31.9* 30.4*  MCV 100.0 97.7 98.8 98.1  PLT 100* 87* 85* 84*   Cardiac Enzymes: No results for input(s): CKTOTAL, CKMB,  CKMBINDEX, TROPONINI in the last 168 hours. BNP: Invalid input(s): POCBNP CBG: No results for input(s): GLUCAP in the last 168 hours. D-Dimer No results for input(s): DDIMER in the last 72 hours. Hgb A1c No results for input(s): HGBA1C in the last 72 hours. Lipid Profile No results for input(s): CHOL, HDL, LDLCALC, TRIG, CHOLHDL, LDLDIRECT in the last 72 hours. Thyroid function studies No results for input(s): TSH, T4TOTAL, T3FREE, THYROIDAB in the last 72 hours.  Invalid input(s): FREET3 Anemia work up No results for input(s): VITAMINB12, FOLATE, FERRITIN, TIBC, IRON, RETICCTPCT in the last 72 hours. Urinalysis    Component Value Date/Time   COLORURINE AMBER (A) 07/02/2021 1016   APPEARANCEUR CLOUDY (A) 07/02/2021 1016   LABSPEC 1.018 07/02/2021 1016   PHURINE 6.0 07/02/2021 1016   GLUCOSEU NEGATIVE 07/02/2021 1016   HGBUR SMALL (A) 07/02/2021 1016   BILIRUBINUR NEGATIVE 07/02/2021 1016   KETONESUR NEGATIVE 07/02/2021 1016   PROTEINUR NEGATIVE 07/02/2021 1016   UROBILINOGEN 0.2 06/18/2015 0441   NITRITE NEGATIVE 07/02/2021 1016   LEUKOCYTESUR LARGE (A) 07/02/2021 1016   Sepsis Labs Invalid input(s): PROCALCITONIN,  WBC,  LACTICIDVEN Microbiology Recent Results (from the past 240 hour(s))  Resp Panel by RT-PCR (Flu A&B, Covid) Nasopharyngeal Swab     Status: None   Collection Time: 07/02/21 10:17 AM   Specimen: Nasopharyngeal Swab; Nasopharyngeal(NP) swabs in vial transport medium  Result Value Ref Range Status   SARS Coronavirus 2 by RT PCR NEGATIVE NEGATIVE Final    Comment: (NOTE) SARS-CoV-2 target nucleic acids are NOT DETECTED.  The SARS-CoV-2 RNA is generally detectable in upper respiratory specimens during the acute phase of infection. The lowest concentration of SARS-CoV-2 viral copies this assay can detect is 138 copies/mL. A negative result does not preclude SARS-Cov-2 infection and should not be used as the sole basis for treatment or other patient  management decisions. A negative result may occur with  improper specimen collection/handling, submission of specimen other than nasopharyngeal swab, presence of viral mutation(s) within the areas targeted by this assay, and inadequate number of viral copies(<138 copies/mL). A negative result must be combined with clinical observations, patient history, and epidemiological information.  The expected result is Negative.  Fact Sheet for Patients:  EntrepreneurPulse.com.au  Fact Sheet for Healthcare Providers:  IncredibleEmployment.be  This test is no t yet approved or cleared by the [place] FDA and  has been authorized for detection and/or diagnosis of SARS-CoV-2 by FDA under an Emergency Use Authorization (EUA). This EUA will remain  in effect (meaning this test can be used) for the duration of the COVID-19 declaration under Section 564(b)(1) of the Act, 21 U.S.C.section 360bbb-3(b)(1), unless the authorization is terminated  or revoked sooner.       Influenza A by PCR NEGATIVE NEGATIVE Final   Influenza B by PCR NEGATIVE NEGATIVE Final    Comment: (NOTE) The Xpert Xpress SARS-CoV-2/FLU/RSV plus assay is intended as an aid in the diagnosis of influenza from Nasopharyngeal swab specimens and should not be used as a sole basis for treatment. Nasal washings and aspirates are unacceptable for Xpert Xpress SARS-CoV-2/FLU/RSV testing.  Fact Sheet for Patients: EntrepreneurPulse.com.au  Fact Sheet for Healthcare Providers: IncredibleEmployment.be  This test is not yet approved or cleared by the [place] FDA and has been authorized for detection and/or diagnosis of SARS-CoV-2 by FDA under an Emergency Use Authorization (EUA). This EUA will remain in effect (meaning this test can be used) for the duration of the COVID-19 declaration under Section 564(b)(1) of the Act, 21 U.S.C. section 360bbb-3(b)(1),  unless the authorization is terminated or revoked.  Performed at [facility], [place] [address], [place], [place] [address]   Blood culture (routine x 2)     Status: None   Collection Time: 07/02/21 12:21 PM   Specimen: BLOOD  Result Value Ref Range Status   Specimen Description BLOOD RIGHT ANTECUBITAL  Final   Special Requests   Final    BOTTLES DRAWN AEROBIC AND ANAEROBIC Blood Culture adequate volume   Culture   Final    NO GROWTH 5 DAYS Performed at [facility], [place] [address] [place], [place] [address]    Report Status 07/07/2021 FINAL  Final  Blood culture (routine x 2)     Status: None   Collection Time: 07/02/21 12:26 PM   Specimen: BLOOD  Result Value Ref Range Status   Specimen Description BLOOD LEFT ANTECUBITAL  Final   Special Requests   Final    BOTTLES DRAWN AEROBIC AND ANAEROBIC Blood Culture results may not be optimal due to an inadequate volume of blood received in culture bottles   Culture   Final    NO GROWTH 5 DAYS Performed at [facility], [place] [address], [place], [place] [address]    Report Status 07/07/2021 FINAL  Final  Urine culture     Status: Abnormal   Collection Time: 07/02/21 12:46 PM   Specimen: Urine, Random  Result Value Ref Range Status   Specimen Description URINE, RANDOM  Final   Special Requests   Final    NONE Performed at [facility], [place] [address] [place], [place] [address]    Culture (A)  Final    >=100,000 COLONIES/mL ESCHERICHIA COLI Confirmed Extended Spectrum Beta-Lactamase Producer (ESBL).  In bloodstream infections from ESBL organisms, carbapenems are preferred over piperacillin/tazobactam. They are shown to have a lower risk of mortality.    Report Status 07/04/2021 FINAL  Final   Organism ID, Bacteria ESCHERICHIA COLI (A)  Final      Susceptibility   Escherichia coli - MIC*    AMPICILLIN >=32 RESISTANT Resistant     CEFAZOLIN >=  64 RESISTANT Resistant     CEFEPIME 4 INTERMEDIATE Intermediate      CEFTRIAXONE >=64 RESISTANT Resistant     CIPROFLOXACIN >=4 RESISTANT Resistant     GENTAMICIN <=1 SENSITIVE Sensitive     IMIPENEM <=0.25 SENSITIVE Sensitive     NITROFURANTOIN 64 INTERMEDIATE Intermediate     TRIMETH/SULFA <=20 SENSITIVE Sensitive     AMPICILLIN/SULBACTAM 16 INTERMEDIATE Intermediate     PIP/TAZO <=4 SENSITIVE Sensitive     * >=100,000 COLONIES/mL ESCHERICHIA COLI     Time coordinating discharge: 35 minutes  SIGNED:   Cordelia Poche, MD [facility] 07/07/2021, 1:43 PM

## 2021-07-07 NOTE — Progress Notes (Addendum)
Paged PT & OT, awaiting call back.    [place] with [employer] from PT.

## 2021-07-07 NOTE — Plan of Care (Signed)

## 2021-07-07 NOTE — Discharge Instructions (Signed)
Douglas Walker,  You were in the hospital because of confusion and found to have a UTI. This was treated with antibiotics. Please follow-up with your urologist and PCP.

## 2021-07-07 NOTE — Progress Notes (Signed)
ANTICOAGULATION CONSULT NOTE - Follow Up  Pharmacy Consult for Warfarin Indication: atrial fibrillation  Allergies  Allergen Reactions   Sulfa Antibiotics Other (See Comments)    Weakness, dizziness   Primidone     Other reaction(s): increased sedation   Simvastatin     Other reaction(s): arthralgias and fatigue, severe    Patient Measurements: Weight: 93 kg (205 lb 0.4 oz)   Vital Signs: Temp: 98.3 F (36.8 C) (07/09 0818) Temp Source: Oral (07/09 0818) BP: 120/64 (07/09 0818) Pulse Rate: 69 (07/09 0818)  Labs: Recent Labs    07/04/21 1004 07/05/21 0059 07/05/21 1040 07/06/21 0134 07/07/21 0055  HGB 10.6*  --  10.1*  --   --   HCT 31.9*  --  30.4*  --   --   PLT 85*  --  84*  --   --   LABPROT  --  21.2*  --  20.9* 20.8*  INR  --  1.8*  --  1.8* 1.8*  CREATININE 1.71*  --  1.48*  --   --     Estimated Creatinine Clearance: 35.4 mL/min (A) (by C-G formula based on SCr of 1.48 mg/dL (H)).   Medical History: Past Medical History:  Diagnosis Date   Arthritis    shoulders and back   Cancer ([place])    skin cancers   Chronic low back pain 12/15/2017   CKD (chronic kidney disease) stage 3, GFR 30-59 ml/min (HCC)    Coronary artery disease    Dysrhythmia     Paroxysmal atrial fibrillation; Tachycardia-bradycardia syndrome   Essential tremor 02/28/2016   GERD (gastroesophageal reflux disease)    Hernia    History of hiatal hernia    Hyperlipemia    Hypertension    Macular degeneration    Neuromuscular disorder (HCC)    neuropathy   Paroxysmal atrial fibrillation ([place]) 10/28/2013   Presence of permanent cardiac pacemaker 12/04/2018   Stroke ([place]) 12/2020   Tachycardia-bradycardia syndrome ([place]) 09/13/2014   TIA (transient ischemic attack)      Assessment: 87 yo male on warfarin PTA for atrial fibrillation and history of CVA. INR 1.7 upon admission.  - PTA warfarin dose: 5mg  MWF, 7.5mg  TTSS  Today, INR subtherapeutic at 1.8 s/p a 10 mg dose on 7/7 and 7.5  mg dose on 7/8. The patient's diet intake has increased slightly. No new interacting medications have been started.  Goal of Therapy:  INR 2-3 Monitor platelets by anticoagulation protocol: Yes   Plan:   Give warfarin PO 10 mg x1 tonight Obtain daily PT/INR and qweekly CBC Monitor for signs and symptoms of bleeding   Shauna Hugh, PharmD, [place]  PGY-1 Pharmacy Resident 07/07/2021 9:17 AM  Please check [email or web address] for unit-specific pharmacy phone numbers.

## 2021-07-09 DIAGNOSIS — N179 Acute kidney failure, unspecified: Secondary | ICD-10-CM | POA: Diagnosis not present

## 2021-07-09 DIAGNOSIS — I2510 Atherosclerotic heart disease of native coronary artery without angina pectoris: Secondary | ICD-10-CM | POA: Diagnosis not present

## 2021-07-09 DIAGNOSIS — I480 Paroxysmal atrial fibrillation: Secondary | ICD-10-CM | POA: Diagnosis not present

## 2021-07-09 DIAGNOSIS — M19011 Primary osteoarthritis, right shoulder: Secondary | ICD-10-CM | POA: Diagnosis not present

## 2021-07-09 DIAGNOSIS — G894 Chronic pain syndrome: Secondary | ICD-10-CM | POA: Diagnosis not present

## 2021-07-09 DIAGNOSIS — J9811 Atelectasis: Secondary | ICD-10-CM | POA: Diagnosis not present

## 2021-07-09 DIAGNOSIS — N1832 Chronic kidney disease, stage 3b: Secondary | ICD-10-CM | POA: Diagnosis not present

## 2021-07-09 DIAGNOSIS — M5135 Other intervertebral disc degeneration, thoracolumbar region: Secondary | ICD-10-CM | POA: Diagnosis not present

## 2021-07-09 DIAGNOSIS — D509 Iron deficiency anemia, unspecified: Secondary | ICD-10-CM | POA: Diagnosis not present

## 2021-07-09 DIAGNOSIS — D631 Anemia in chronic kidney disease: Secondary | ICD-10-CM | POA: Diagnosis not present

## 2021-07-09 DIAGNOSIS — I082 Rheumatic disorders of both aortic and tricuspid valves: Secondary | ICD-10-CM | POA: Diagnosis not present

## 2021-07-09 DIAGNOSIS — M170 Bilateral primary osteoarthritis of knee: Secondary | ICD-10-CM | POA: Diagnosis not present

## 2021-07-09 DIAGNOSIS — M19012 Primary osteoarthritis, left shoulder: Secondary | ICD-10-CM | POA: Diagnosis not present

## 2021-07-09 DIAGNOSIS — G629 Polyneuropathy, unspecified: Secondary | ICD-10-CM | POA: Diagnosis not present

## 2021-07-09 DIAGNOSIS — B9620 Unspecified Escherichia coli [E. coli] as the cause of diseases classified elsewhere: Secondary | ICD-10-CM | POA: Diagnosis not present

## 2021-07-09 DIAGNOSIS — N401 Enlarged prostate with lower urinary tract symptoms: Secondary | ICD-10-CM | POA: Diagnosis not present

## 2021-07-09 DIAGNOSIS — I130 Hypertensive heart and chronic kidney disease with heart failure and stage 1 through stage 4 chronic kidney disease, or unspecified chronic kidney disease: Secondary | ICD-10-CM | POA: Diagnosis not present

## 2021-07-09 DIAGNOSIS — M47815 Spondylosis without myelopathy or radiculopathy, thoracolumbar region: Secondary | ICD-10-CM | POA: Diagnosis not present

## 2021-07-09 DIAGNOSIS — J449 Chronic obstructive pulmonary disease, unspecified: Secondary | ICD-10-CM | POA: Diagnosis not present

## 2021-07-09 DIAGNOSIS — D696 Thrombocytopenia, unspecified: Secondary | ICD-10-CM | POA: Diagnosis not present

## 2021-07-09 DIAGNOSIS — I495 Sick sinus syndrome: Secondary | ICD-10-CM | POA: Diagnosis not present

## 2021-07-09 DIAGNOSIS — I5032 Chronic diastolic (congestive) heart failure: Secondary | ICD-10-CM | POA: Diagnosis not present

## 2021-07-09 DIAGNOSIS — G250 Essential tremor: Secondary | ICD-10-CM | POA: Diagnosis not present

## 2021-07-09 DIAGNOSIS — R350 Frequency of micturition: Secondary | ICD-10-CM | POA: Diagnosis not present

## 2021-07-09 DIAGNOSIS — N390 Urinary tract infection, site not specified: Secondary | ICD-10-CM | POA: Diagnosis not present

## 2021-07-10 ENCOUNTER — Other Ambulatory Visit: Payer: Self-pay

## 2021-07-10 ENCOUNTER — Ambulatory Visit (INDEPENDENT_AMBULATORY_CARE_PROVIDER_SITE_OTHER): Payer: Medicare Other | Admitting: *Deleted

## 2021-07-10 DIAGNOSIS — I480 Paroxysmal atrial fibrillation: Secondary | ICD-10-CM | POA: Diagnosis not present

## 2021-07-10 DIAGNOSIS — I5032 Chronic diastolic (congestive) heart failure: Secondary | ICD-10-CM | POA: Diagnosis not present

## 2021-07-10 DIAGNOSIS — Z5181 Encounter for therapeutic drug level monitoring: Secondary | ICD-10-CM | POA: Diagnosis not present

## 2021-07-10 DIAGNOSIS — N390 Urinary tract infection, site not specified: Secondary | ICD-10-CM | POA: Diagnosis not present

## 2021-07-10 DIAGNOSIS — F322 Major depressive disorder, single episode, severe without psychotic features: Secondary | ICD-10-CM | POA: Diagnosis not present

## 2021-07-10 DIAGNOSIS — E039 Hypothyroidism, unspecified: Secondary | ICD-10-CM | POA: Diagnosis not present

## 2021-07-10 DIAGNOSIS — I130 Hypertensive heart and chronic kidney disease with heart failure and stage 1 through stage 4 chronic kidney disease, or unspecified chronic kidney disease: Secondary | ICD-10-CM | POA: Diagnosis not present

## 2021-07-10 LAB — POCT INR: INR: 2.1 (ref 2.0–3.0)

## 2021-07-10 NOTE — Patient Instructions (Signed)
Description   Continue taking 1.5 tablets daily except for 1 tablet on Mondays, Wednesdays and Fridays.  Recheck INR in 1 week. Call # [phone] if placed on any new medications.

## 2021-07-11 DIAGNOSIS — N390 Urinary tract infection, site not specified: Secondary | ICD-10-CM | POA: Diagnosis not present

## 2021-07-11 DIAGNOSIS — B9620 Unspecified Escherichia coli [E. coli] as the cause of diseases classified elsewhere: Secondary | ICD-10-CM | POA: Diagnosis not present

## 2021-07-11 DIAGNOSIS — R350 Frequency of micturition: Secondary | ICD-10-CM | POA: Diagnosis not present

## 2021-07-11 DIAGNOSIS — I5032 Chronic diastolic (congestive) heart failure: Secondary | ICD-10-CM | POA: Diagnosis not present

## 2021-07-11 DIAGNOSIS — I130 Hypertensive heart and chronic kidney disease with heart failure and stage 1 through stage 4 chronic kidney disease, or unspecified chronic kidney disease: Secondary | ICD-10-CM | POA: Diagnosis not present

## 2021-07-11 DIAGNOSIS — N401 Enlarged prostate with lower urinary tract symptoms: Secondary | ICD-10-CM | POA: Diagnosis not present

## 2021-07-12 DIAGNOSIS — G459 Transient cerebral ischemic attack, unspecified: Secondary | ICD-10-CM | POA: Diagnosis not present

## 2021-07-12 DIAGNOSIS — I480 Paroxysmal atrial fibrillation: Secondary | ICD-10-CM | POA: Diagnosis not present

## 2021-07-12 DIAGNOSIS — E782 Mixed hyperlipidemia: Secondary | ICD-10-CM | POA: Diagnosis not present

## 2021-07-12 DIAGNOSIS — K219 Gastro-esophageal reflux disease without esophagitis: Secondary | ICD-10-CM | POA: Diagnosis not present

## 2021-07-12 DIAGNOSIS — I10 Essential (primary) hypertension: Secondary | ICD-10-CM | POA: Diagnosis not present

## 2021-07-12 DIAGNOSIS — I2510 Atherosclerotic heart disease of native coronary artery without angina pectoris: Secondary | ICD-10-CM | POA: Diagnosis not present

## 2021-07-12 DIAGNOSIS — I5032 Chronic diastolic (congestive) heart failure: Secondary | ICD-10-CM | POA: Diagnosis not present

## 2021-07-12 DIAGNOSIS — I130 Hypertensive heart and chronic kidney disease with heart failure and stage 1 through stage 4 chronic kidney disease, or unspecified chronic kidney disease: Secondary | ICD-10-CM | POA: Diagnosis not present

## 2021-07-12 DIAGNOSIS — N400 Enlarged prostate without lower urinary tract symptoms: Secondary | ICD-10-CM | POA: Diagnosis not present

## 2021-07-12 DIAGNOSIS — N183 Chronic kidney disease, stage 3 unspecified: Secondary | ICD-10-CM | POA: Diagnosis not present

## 2021-07-12 DIAGNOSIS — E039 Hypothyroidism, unspecified: Secondary | ICD-10-CM | POA: Diagnosis not present

## 2021-07-17 ENCOUNTER — Ambulatory Visit (INDEPENDENT_AMBULATORY_CARE_PROVIDER_SITE_OTHER): Payer: Medicare Other | Admitting: *Deleted

## 2021-07-17 ENCOUNTER — Other Ambulatory Visit: Payer: Self-pay

## 2021-07-17 DIAGNOSIS — I130 Hypertensive heart and chronic kidney disease with heart failure and stage 1 through stage 4 chronic kidney disease, or unspecified chronic kidney disease: Secondary | ICD-10-CM | POA: Diagnosis not present

## 2021-07-17 DIAGNOSIS — I480 Paroxysmal atrial fibrillation: Secondary | ICD-10-CM

## 2021-07-17 DIAGNOSIS — I5032 Chronic diastolic (congestive) heart failure: Secondary | ICD-10-CM | POA: Diagnosis not present

## 2021-07-17 DIAGNOSIS — N390 Urinary tract infection, site not specified: Secondary | ICD-10-CM | POA: Diagnosis not present

## 2021-07-17 DIAGNOSIS — Z5181 Encounter for therapeutic drug level monitoring: Secondary | ICD-10-CM | POA: Diagnosis not present

## 2021-07-17 DIAGNOSIS — N401 Enlarged prostate with lower urinary tract symptoms: Secondary | ICD-10-CM | POA: Diagnosis not present

## 2021-07-17 DIAGNOSIS — R350 Frequency of micturition: Secondary | ICD-10-CM | POA: Diagnosis not present

## 2021-07-17 DIAGNOSIS — B9620 Unspecified Escherichia coli [E. coli] as the cause of diseases classified elsewhere: Secondary | ICD-10-CM | POA: Diagnosis not present

## 2021-07-17 LAB — POCT INR: INR: 1.8 — AB (ref 2.0–3.0)

## 2021-07-17 NOTE — Patient Instructions (Addendum)
Description    Take 2 tablets of warfarin today then START taking warfarin 1.5 tablets daily except for 1 tablet on Wednesday and Fridays. Recheck INR in 10 days. [facility] [phone].

## 2021-07-19 ENCOUNTER — Ambulatory Visit (INDEPENDENT_AMBULATORY_CARE_PROVIDER_SITE_OTHER): Payer: Medicare Other

## 2021-07-19 DIAGNOSIS — B9620 Unspecified Escherichia coli [E. coli] as the cause of diseases classified elsewhere: Secondary | ICD-10-CM | POA: Diagnosis not present

## 2021-07-19 DIAGNOSIS — I130 Hypertensive heart and chronic kidney disease with heart failure and stage 1 through stage 4 chronic kidney disease, or unspecified chronic kidney disease: Secondary | ICD-10-CM | POA: Diagnosis not present

## 2021-07-19 DIAGNOSIS — N390 Urinary tract infection, site not specified: Secondary | ICD-10-CM | POA: Diagnosis not present

## 2021-07-19 DIAGNOSIS — R350 Frequency of micturition: Secondary | ICD-10-CM | POA: Diagnosis not present

## 2021-07-19 DIAGNOSIS — I495 Sick sinus syndrome: Secondary | ICD-10-CM | POA: Diagnosis not present

## 2021-07-19 DIAGNOSIS — I5032 Chronic diastolic (congestive) heart failure: Secondary | ICD-10-CM | POA: Diagnosis not present

## 2021-07-19 DIAGNOSIS — N401 Enlarged prostate with lower urinary tract symptoms: Secondary | ICD-10-CM | POA: Diagnosis not present

## 2021-07-19 LAB — CUP PACEART REMOTE DEVICE CHECK
Battery Remaining Longevity: 43 mo
Battery Voltage: 2.94 V
Brady Statistic AP VP Percent: 99.93 %
Brady Statistic AP VS Percent: 0 %
Brady Statistic AS VP Percent: 0 %
Brady Statistic AS VS Percent: 0.07 %
Brady Statistic RA Percent Paced: 99.97 %
Brady Statistic RV Percent Paced: 99.93 %
Date Time Interrogation Session: 20220720203546
Implantable Lead Implant Date: 20191206
Implantable Lead Implant Date: 20191206
Implantable Lead Location: 753860
Implantable Lead Location: 753860
Implantable Lead Model: 3830
Implantable Lead Model: 5076
Implantable Pulse Generator Implant Date: 20191206
Lead Channel Impedance Value: 285 Ohm
Lead Channel Impedance Value: 304 Ohm
Lead Channel Impedance Value: 380 Ohm
Lead Channel Impedance Value: 380 Ohm
Lead Channel Sensing Intrinsic Amplitude: 2.875 mV
Lead Channel Sensing Intrinsic Amplitude: 3.375 mV
Lead Channel Sensing Intrinsic Amplitude: 3.375 mV
Lead Channel Sensing Intrinsic Amplitude: 4.25 mV
Lead Channel Setting Pacing Amplitude: 2 V
Lead Channel Setting Pacing Amplitude: 2.5 V
Lead Channel Setting Pacing Pulse Width: 0.4 ms
Lead Channel Setting Sensing Sensitivity: 1.2 mV

## 2021-07-23 DIAGNOSIS — N3021 Other chronic cystitis with hematuria: Secondary | ICD-10-CM | POA: Diagnosis not present

## 2021-07-24 DIAGNOSIS — N390 Urinary tract infection, site not specified: Secondary | ICD-10-CM | POA: Diagnosis not present

## 2021-07-24 DIAGNOSIS — N401 Enlarged prostate with lower urinary tract symptoms: Secondary | ICD-10-CM | POA: Diagnosis not present

## 2021-07-24 DIAGNOSIS — I5032 Chronic diastolic (congestive) heart failure: Secondary | ICD-10-CM | POA: Diagnosis not present

## 2021-07-24 DIAGNOSIS — R350 Frequency of micturition: Secondary | ICD-10-CM | POA: Diagnosis not present

## 2021-07-24 DIAGNOSIS — B9620 Unspecified Escherichia coli [E. coli] as the cause of diseases classified elsewhere: Secondary | ICD-10-CM | POA: Diagnosis not present

## 2021-07-24 DIAGNOSIS — I130 Hypertensive heart and chronic kidney disease with heart failure and stage 1 through stage 4 chronic kidney disease, or unspecified chronic kidney disease: Secondary | ICD-10-CM | POA: Diagnosis not present

## 2021-07-26 DIAGNOSIS — I130 Hypertensive heart and chronic kidney disease with heart failure and stage 1 through stage 4 chronic kidney disease, or unspecified chronic kidney disease: Secondary | ICD-10-CM | POA: Diagnosis not present

## 2021-07-26 DIAGNOSIS — R350 Frequency of micturition: Secondary | ICD-10-CM | POA: Diagnosis not present

## 2021-07-26 DIAGNOSIS — I5032 Chronic diastolic (congestive) heart failure: Secondary | ICD-10-CM | POA: Diagnosis not present

## 2021-07-26 DIAGNOSIS — B9620 Unspecified Escherichia coli [E. coli] as the cause of diseases classified elsewhere: Secondary | ICD-10-CM | POA: Diagnosis not present

## 2021-07-26 DIAGNOSIS — N390 Urinary tract infection, site not specified: Secondary | ICD-10-CM | POA: Diagnosis not present

## 2021-07-26 DIAGNOSIS — N401 Enlarged prostate with lower urinary tract symptoms: Secondary | ICD-10-CM | POA: Diagnosis not present

## 2021-07-27 ENCOUNTER — Other Ambulatory Visit: Payer: Self-pay

## 2021-07-27 ENCOUNTER — Ambulatory Visit (INDEPENDENT_AMBULATORY_CARE_PROVIDER_SITE_OTHER): Payer: Medicare Other

## 2021-07-27 DIAGNOSIS — Z5181 Encounter for therapeutic drug level monitoring: Secondary | ICD-10-CM | POA: Diagnosis not present

## 2021-07-27 DIAGNOSIS — I480 Paroxysmal atrial fibrillation: Secondary | ICD-10-CM | POA: Diagnosis not present

## 2021-07-27 LAB — POCT INR: INR: 1.7 — AB (ref 2.0–3.0)

## 2021-07-27 NOTE — Patient Instructions (Signed)
Description    Take 2 tablets of warfarin today then START taking warfarin 1.5 tablets daily except for 1 tablet on Wednesday. Recheck INR in 2 weeks. [facility] [phone].

## 2021-07-30 DIAGNOSIS — N3021 Other chronic cystitis with hematuria: Secondary | ICD-10-CM | POA: Diagnosis not present

## 2021-07-31 DIAGNOSIS — B9620 Unspecified Escherichia coli [E. coli] as the cause of diseases classified elsewhere: Secondary | ICD-10-CM | POA: Diagnosis not present

## 2021-07-31 DIAGNOSIS — I5032 Chronic diastolic (congestive) heart failure: Secondary | ICD-10-CM | POA: Diagnosis not present

## 2021-07-31 DIAGNOSIS — N390 Urinary tract infection, site not specified: Secondary | ICD-10-CM | POA: Diagnosis not present

## 2021-07-31 DIAGNOSIS — N401 Enlarged prostate with lower urinary tract symptoms: Secondary | ICD-10-CM | POA: Diagnosis not present

## 2021-07-31 DIAGNOSIS — I130 Hypertensive heart and chronic kidney disease with heart failure and stage 1 through stage 4 chronic kidney disease, or unspecified chronic kidney disease: Secondary | ICD-10-CM | POA: Diagnosis not present

## 2021-07-31 DIAGNOSIS — R350 Frequency of micturition: Secondary | ICD-10-CM | POA: Diagnosis not present

## 2021-08-02 DIAGNOSIS — R350 Frequency of micturition: Secondary | ICD-10-CM | POA: Diagnosis not present

## 2021-08-02 DIAGNOSIS — N401 Enlarged prostate with lower urinary tract symptoms: Secondary | ICD-10-CM | POA: Diagnosis not present

## 2021-08-02 DIAGNOSIS — N390 Urinary tract infection, site not specified: Secondary | ICD-10-CM | POA: Diagnosis not present

## 2021-08-02 DIAGNOSIS — I5032 Chronic diastolic (congestive) heart failure: Secondary | ICD-10-CM | POA: Diagnosis not present

## 2021-08-02 DIAGNOSIS — I130 Hypertensive heart and chronic kidney disease with heart failure and stage 1 through stage 4 chronic kidney disease, or unspecified chronic kidney disease: Secondary | ICD-10-CM | POA: Diagnosis not present

## 2021-08-02 DIAGNOSIS — B9620 Unspecified Escherichia coli [E. coli] as the cause of diseases classified elsewhere: Secondary | ICD-10-CM | POA: Diagnosis not present

## 2021-08-03 DIAGNOSIS — N401 Enlarged prostate with lower urinary tract symptoms: Secondary | ICD-10-CM | POA: Diagnosis not present

## 2021-08-03 DIAGNOSIS — R338 Other retention of urine: Secondary | ICD-10-CM | POA: Diagnosis not present

## 2021-08-07 DIAGNOSIS — N390 Urinary tract infection, site not specified: Secondary | ICD-10-CM | POA: Diagnosis not present

## 2021-08-07 DIAGNOSIS — I130 Hypertensive heart and chronic kidney disease with heart failure and stage 1 through stage 4 chronic kidney disease, or unspecified chronic kidney disease: Secondary | ICD-10-CM | POA: Diagnosis not present

## 2021-08-07 DIAGNOSIS — I5032 Chronic diastolic (congestive) heart failure: Secondary | ICD-10-CM | POA: Diagnosis not present

## 2021-08-07 DIAGNOSIS — R350 Frequency of micturition: Secondary | ICD-10-CM | POA: Diagnosis not present

## 2021-08-07 DIAGNOSIS — N401 Enlarged prostate with lower urinary tract symptoms: Secondary | ICD-10-CM | POA: Diagnosis not present

## 2021-08-07 DIAGNOSIS — B9620 Unspecified Escherichia coli [E. coli] as the cause of diseases classified elsewhere: Secondary | ICD-10-CM | POA: Diagnosis not present

## 2021-08-08 DIAGNOSIS — M47815 Spondylosis without myelopathy or radiculopathy, thoracolumbar region: Secondary | ICD-10-CM | POA: Diagnosis not present

## 2021-08-08 DIAGNOSIS — N179 Acute kidney failure, unspecified: Secondary | ICD-10-CM | POA: Diagnosis not present

## 2021-08-08 DIAGNOSIS — I130 Hypertensive heart and chronic kidney disease with heart failure and stage 1 through stage 4 chronic kidney disease, or unspecified chronic kidney disease: Secondary | ICD-10-CM | POA: Diagnosis not present

## 2021-08-08 DIAGNOSIS — N390 Urinary tract infection, site not specified: Secondary | ICD-10-CM | POA: Diagnosis not present

## 2021-08-08 DIAGNOSIS — J9811 Atelectasis: Secondary | ICD-10-CM | POA: Diagnosis not present

## 2021-08-08 DIAGNOSIS — G250 Essential tremor: Secondary | ICD-10-CM | POA: Diagnosis not present

## 2021-08-08 DIAGNOSIS — G629 Polyneuropathy, unspecified: Secondary | ICD-10-CM | POA: Diagnosis not present

## 2021-08-08 DIAGNOSIS — R350 Frequency of micturition: Secondary | ICD-10-CM | POA: Diagnosis not present

## 2021-08-08 DIAGNOSIS — I5032 Chronic diastolic (congestive) heart failure: Secondary | ICD-10-CM | POA: Diagnosis not present

## 2021-08-08 DIAGNOSIS — M5135 Other intervertebral disc degeneration, thoracolumbar region: Secondary | ICD-10-CM | POA: Diagnosis not present

## 2021-08-08 DIAGNOSIS — I495 Sick sinus syndrome: Secondary | ICD-10-CM | POA: Diagnosis not present

## 2021-08-08 DIAGNOSIS — I082 Rheumatic disorders of both aortic and tricuspid valves: Secondary | ICD-10-CM | POA: Diagnosis not present

## 2021-08-08 DIAGNOSIS — G894 Chronic pain syndrome: Secondary | ICD-10-CM | POA: Diagnosis not present

## 2021-08-08 DIAGNOSIS — M19012 Primary osteoarthritis, left shoulder: Secondary | ICD-10-CM | POA: Diagnosis not present

## 2021-08-08 DIAGNOSIS — D509 Iron deficiency anemia, unspecified: Secondary | ICD-10-CM | POA: Diagnosis not present

## 2021-08-08 DIAGNOSIS — I2510 Atherosclerotic heart disease of native coronary artery without angina pectoris: Secondary | ICD-10-CM | POA: Diagnosis not present

## 2021-08-08 DIAGNOSIS — D696 Thrombocytopenia, unspecified: Secondary | ICD-10-CM | POA: Diagnosis not present

## 2021-08-08 DIAGNOSIS — N401 Enlarged prostate with lower urinary tract symptoms: Secondary | ICD-10-CM | POA: Diagnosis not present

## 2021-08-08 DIAGNOSIS — M170 Bilateral primary osteoarthritis of knee: Secondary | ICD-10-CM | POA: Diagnosis not present

## 2021-08-08 DIAGNOSIS — M19011 Primary osteoarthritis, right shoulder: Secondary | ICD-10-CM | POA: Diagnosis not present

## 2021-08-08 DIAGNOSIS — D631 Anemia in chronic kidney disease: Secondary | ICD-10-CM | POA: Diagnosis not present

## 2021-08-08 DIAGNOSIS — N1832 Chronic kidney disease, stage 3b: Secondary | ICD-10-CM | POA: Diagnosis not present

## 2021-08-08 DIAGNOSIS — J449 Chronic obstructive pulmonary disease, unspecified: Secondary | ICD-10-CM | POA: Diagnosis not present

## 2021-08-08 DIAGNOSIS — I480 Paroxysmal atrial fibrillation: Secondary | ICD-10-CM | POA: Diagnosis not present

## 2021-08-08 DIAGNOSIS — B9620 Unspecified Escherichia coli [E. coli] as the cause of diseases classified elsewhere: Secondary | ICD-10-CM | POA: Diagnosis not present

## 2021-08-10 ENCOUNTER — Ambulatory Visit (INDEPENDENT_AMBULATORY_CARE_PROVIDER_SITE_OTHER): Payer: Medicare Other

## 2021-08-10 ENCOUNTER — Other Ambulatory Visit: Payer: Self-pay

## 2021-08-10 DIAGNOSIS — I480 Paroxysmal atrial fibrillation: Secondary | ICD-10-CM | POA: Diagnosis not present

## 2021-08-10 DIAGNOSIS — Z5181 Encounter for therapeutic drug level monitoring: Secondary | ICD-10-CM | POA: Diagnosis not present

## 2021-08-10 LAB — POCT INR: INR: 2.1 (ref 2.0–3.0)

## 2021-08-10 NOTE — Patient Instructions (Signed)
Description   Continue taking warfarin 1.5 tablets daily except for 1 tablet on Wednesday. Recheck INR in 3 weeks. [facility] [phone].

## 2021-08-13 NOTE — Progress Notes (Signed)
Remote pacemaker transmission.   

## 2021-08-14 ENCOUNTER — Other Ambulatory Visit: Payer: Self-pay

## 2021-08-14 DIAGNOSIS — I130 Hypertensive heart and chronic kidney disease with heart failure and stage 1 through stage 4 chronic kidney disease, or unspecified chronic kidney disease: Secondary | ICD-10-CM | POA: Diagnosis not present

## 2021-08-14 DIAGNOSIS — N390 Urinary tract infection, site not specified: Secondary | ICD-10-CM | POA: Diagnosis not present

## 2021-08-14 DIAGNOSIS — R350 Frequency of micturition: Secondary | ICD-10-CM | POA: Diagnosis not present

## 2021-08-14 DIAGNOSIS — I5032 Chronic diastolic (congestive) heart failure: Secondary | ICD-10-CM | POA: Diagnosis not present

## 2021-08-14 DIAGNOSIS — N401 Enlarged prostate with lower urinary tract symptoms: Secondary | ICD-10-CM | POA: Diagnosis not present

## 2021-08-14 DIAGNOSIS — B9620 Unspecified Escherichia coli [E. coli] as the cause of diseases classified elsewhere: Secondary | ICD-10-CM | POA: Diagnosis not present

## 2021-08-14 MED ORDER — WARFARIN SODIUM 5 MG PO TABS: ORAL_TABLET | ORAL | 3 refills | Status: DC

## 2021-08-14 NOTE — Telephone Encounter (Signed)
Received faxed refill request from [facility] for Warfarin.

## 2021-08-18 DIAGNOSIS — N3001 Acute cystitis with hematuria: Secondary | ICD-10-CM | POA: Diagnosis not present

## 2021-08-21 DIAGNOSIS — I5032 Chronic diastolic (congestive) heart failure: Secondary | ICD-10-CM | POA: Diagnosis not present

## 2021-08-21 DIAGNOSIS — N390 Urinary tract infection, site not specified: Secondary | ICD-10-CM | POA: Diagnosis not present

## 2021-08-21 DIAGNOSIS — B9620 Unspecified Escherichia coli [E. coli] as the cause of diseases classified elsewhere: Secondary | ICD-10-CM | POA: Diagnosis not present

## 2021-08-21 DIAGNOSIS — I130 Hypertensive heart and chronic kidney disease with heart failure and stage 1 through stage 4 chronic kidney disease, or unspecified chronic kidney disease: Secondary | ICD-10-CM | POA: Diagnosis not present

## 2021-08-21 DIAGNOSIS — R338 Other retention of urine: Secondary | ICD-10-CM | POA: Diagnosis not present

## 2021-08-21 DIAGNOSIS — R350 Frequency of micturition: Secondary | ICD-10-CM | POA: Diagnosis not present

## 2021-08-21 DIAGNOSIS — N401 Enlarged prostate with lower urinary tract symptoms: Secondary | ICD-10-CM | POA: Diagnosis not present

## 2021-08-21 DIAGNOSIS — N3020 Other chronic cystitis without hematuria: Secondary | ICD-10-CM | POA: Diagnosis not present

## 2021-08-24 ENCOUNTER — Ambulatory Visit: Payer: Medicare Other | Admitting: Podiatry

## 2021-08-28 DIAGNOSIS — I5032 Chronic diastolic (congestive) heart failure: Secondary | ICD-10-CM | POA: Diagnosis not present

## 2021-08-28 DIAGNOSIS — R350 Frequency of micturition: Secondary | ICD-10-CM | POA: Diagnosis not present

## 2021-08-28 DIAGNOSIS — B9620 Unspecified Escherichia coli [E. coli] as the cause of diseases classified elsewhere: Secondary | ICD-10-CM | POA: Diagnosis not present

## 2021-08-28 DIAGNOSIS — N401 Enlarged prostate with lower urinary tract symptoms: Secondary | ICD-10-CM | POA: Diagnosis not present

## 2021-08-28 DIAGNOSIS — I130 Hypertensive heart and chronic kidney disease with heart failure and stage 1 through stage 4 chronic kidney disease, or unspecified chronic kidney disease: Secondary | ICD-10-CM | POA: Diagnosis not present

## 2021-08-28 DIAGNOSIS — N390 Urinary tract infection, site not specified: Secondary | ICD-10-CM | POA: Diagnosis not present

## 2021-08-29 ENCOUNTER — Ambulatory Visit (INDEPENDENT_AMBULATORY_CARE_PROVIDER_SITE_OTHER): Payer: Medicare Other | Admitting: Podiatry

## 2021-08-29 ENCOUNTER — Other Ambulatory Visit: Payer: Self-pay

## 2021-08-29 ENCOUNTER — Encounter: Payer: Self-pay | Admitting: Podiatry

## 2021-08-29 DIAGNOSIS — D696 Thrombocytopenia, unspecified: Secondary | ICD-10-CM

## 2021-08-29 DIAGNOSIS — B351 Tinea unguium: Secondary | ICD-10-CM | POA: Diagnosis not present

## 2021-08-29 DIAGNOSIS — M79676 Pain in unspecified toe(s): Secondary | ICD-10-CM | POA: Diagnosis not present

## 2021-08-29 DIAGNOSIS — D689 Coagulation defect, unspecified: Secondary | ICD-10-CM | POA: Diagnosis not present

## 2021-08-29 DIAGNOSIS — E1142 Type 2 diabetes mellitus with diabetic polyneuropathy: Secondary | ICD-10-CM | POA: Diagnosis not present

## 2021-08-29 DIAGNOSIS — L608 Other nail disorders: Secondary | ICD-10-CM

## 2021-08-29 NOTE — Progress Notes (Signed)
This patient returns to my office for at risk foot care.  This patient requires this care by a professional since this patient will be at risk due to having peripheral neuropathy, CKD, Coagulation defect and thrombocytopenia.  This patient is unable to cut nails himself since the patient cannot reach his nails.These nails are painful walking and wearing shoes.  This patient presents for at risk foot care today.  He presents to the office with male son.  He is taking coumadin.    General Appearance  Alert, conversant and in no acute stress.  Vascular  Dorsalis pedis and posterior tibial  pulses are not  palpable  bilaterally.  Capillary return is within normal limits  bilaterally. Temperature is within normal limits  Bilaterally. Swelling feet/legs  Neurologic  Senn-Weinstein monofilament wire test within normal limits  bilaterally. Muscle power within normal limits bilaterally.  Nails Thick disfigured discolored nails with subungual debris  from hallux to fifth toes bilaterally. Pincer nails hallux  B/L. No evidence of bacterial infection or drainage bilaterally.  Orthopedic  No limitations of motion  feet .  No crepitus or effusions noted.  No bony pathology or digital deformities noted.  HAV  B/L.  Skin  normotropic skin with no porokeratosis noted bilaterally.  No signs of infections or ulcers noted.     Onychomycosis  Pain in right toes  Pain in left toes  Consent was obtained for treatment procedures.   Mechanical debridement of nails 1-5  bilaterally performed with a nail nipper.  Filed with dremel without incident. Told him to soak his right foot in soapy sudsy water.   Return office visit  10 weeks                   Told patient to return for periodic foot care and evaluation due to potential at risk complications.   Gardiner Barefoot DPM

## 2021-08-30 DIAGNOSIS — R338 Other retention of urine: Secondary | ICD-10-CM | POA: Diagnosis not present

## 2021-08-30 DIAGNOSIS — N3020 Other chronic cystitis without hematuria: Secondary | ICD-10-CM | POA: Diagnosis not present

## 2021-08-31 ENCOUNTER — Other Ambulatory Visit: Payer: Self-pay

## 2021-08-31 ENCOUNTER — Ambulatory Visit (INDEPENDENT_AMBULATORY_CARE_PROVIDER_SITE_OTHER): Payer: Medicare Other | Admitting: *Deleted

## 2021-08-31 DIAGNOSIS — I480 Paroxysmal atrial fibrillation: Secondary | ICD-10-CM | POA: Diagnosis not present

## 2021-08-31 LAB — POCT INR: INR: 1.6 — AB (ref 2.0–3.0)

## 2021-08-31 NOTE — Patient Instructions (Signed)
Description   Take 2 tablets of warfarin today and tomorrow, then continue taking warfarin 1.5 tablets daily except for 1 tablet on Wednesday. Recheck INR in 2 weeks. [facility] [phone].

## 2021-09-14 ENCOUNTER — Other Ambulatory Visit: Payer: Self-pay

## 2021-09-14 ENCOUNTER — Ambulatory Visit (INDEPENDENT_AMBULATORY_CARE_PROVIDER_SITE_OTHER): Payer: Medicare Other | Admitting: *Deleted

## 2021-09-14 DIAGNOSIS — I480 Paroxysmal atrial fibrillation: Secondary | ICD-10-CM | POA: Diagnosis not present

## 2021-09-14 DIAGNOSIS — Z5181 Encounter for therapeutic drug level monitoring: Secondary | ICD-10-CM

## 2021-09-14 LAB — POCT INR: INR: 1.8 — AB (ref 2.0–3.0)

## 2021-09-14 NOTE — Patient Instructions (Signed)
Description    Take 2 tablets of warfarin today and then START taking warfarin 1.5 tablets daily. Recheck INR in 2 weeks. Coumadin Clinic [phone].

## 2021-09-15 ENCOUNTER — Encounter (HOSPITAL_COMMUNITY): Payer: Self-pay

## 2021-09-15 ENCOUNTER — Other Ambulatory Visit: Payer: Self-pay

## 2021-09-15 ENCOUNTER — Emergency Department (HOSPITAL_COMMUNITY)
Admission: EM | Admit: 2021-09-15 | Discharge: 2021-09-15 | Disposition: A | Discharge status: Home / Self Care | Payer: Medicare Other | Attending: Emergency Medicine | Admitting: Emergency Medicine

## 2021-09-15 DIAGNOSIS — N3001 Acute cystitis with hematuria: Secondary | ICD-10-CM | POA: Diagnosis not present

## 2021-09-15 DIAGNOSIS — I480 Paroxysmal atrial fibrillation: Secondary | ICD-10-CM | POA: Insufficient documentation

## 2021-09-15 DIAGNOSIS — I5032 Chronic diastolic (congestive) heart failure: Secondary | ICD-10-CM | POA: Insufficient documentation

## 2021-09-15 DIAGNOSIS — Z950 Presence of cardiac pacemaker: Secondary | ICD-10-CM | POA: Diagnosis not present

## 2021-09-15 DIAGNOSIS — I2510 Atherosclerotic heart disease of native coronary artery without angina pectoris: Secondary | ICD-10-CM | POA: Diagnosis not present

## 2021-09-15 DIAGNOSIS — Z87891 Personal history of nicotine dependence: Secondary | ICD-10-CM | POA: Insufficient documentation

## 2021-09-15 DIAGNOSIS — Z7901 Long term (current) use of anticoagulants: Secondary | ICD-10-CM | POA: Insufficient documentation

## 2021-09-15 DIAGNOSIS — B9689 Other specified bacterial agents as the cause of diseases classified elsewhere: Secondary | ICD-10-CM | POA: Insufficient documentation

## 2021-09-15 DIAGNOSIS — I130 Hypertensive heart and chronic kidney disease with heart failure and stage 1 through stage 4 chronic kidney disease, or unspecified chronic kidney disease: Secondary | ICD-10-CM | POA: Diagnosis not present

## 2021-09-15 DIAGNOSIS — E039 Hypothyroidism, unspecified: Secondary | ICD-10-CM | POA: Insufficient documentation

## 2021-09-15 DIAGNOSIS — N183 Chronic kidney disease, stage 3 unspecified: Secondary | ICD-10-CM | POA: Insufficient documentation

## 2021-09-15 DIAGNOSIS — Z85828 Personal history of other malignant neoplasm of skin: Secondary | ICD-10-CM | POA: Insufficient documentation

## 2021-09-15 DIAGNOSIS — Z8551 Personal history of malignant neoplasm of bladder: Secondary | ICD-10-CM | POA: Insufficient documentation

## 2021-09-15 DIAGNOSIS — R1030 Lower abdominal pain, unspecified: Secondary | ICD-10-CM | POA: Diagnosis present

## 2021-09-15 DIAGNOSIS — Z79899 Other long term (current) drug therapy: Secondary | ICD-10-CM | POA: Insufficient documentation

## 2021-09-15 LAB — URINALYSIS, ROUTINE W REFLEX MICROSCOPIC
Bilirubin Urine: NEGATIVE
Glucose, UA: NEGATIVE mg/dL
Ketones, ur: NEGATIVE mg/dL
Nitrite: POSITIVE — AB
Protein, ur: NEGATIVE mg/dL
Specific Gravity, Urine: 1.015 (ref 1.005–1.030)
WBC, UA: >50 WBC/hpf — ABNORMAL HIGH (ref 0–5)
pH: 7 (ref 5.0–8.0)

## 2021-09-15 LAB — I-STAT CHEM 8, ED
BUN: 24 mg/dL — ABNORMAL HIGH (ref 8–23)
Calcium, Ion: 1.21 mmol/L (ref 1.15–1.40)
Chloride: 107 mmol/L (ref 98–111)
Creatinine, Ser: 1.6 mg/dL — ABNORMAL HIGH (ref 0.61–1.24)
Glucose, Bld: 75 mg/dL (ref 70–99)
HCT: 36 % — ABNORMAL LOW (ref 39.0–52.0)
Hemoglobin: 12.2 g/dL — ABNORMAL LOW (ref 13.0–17.0)
Potassium: 3.8 mmol/L (ref 3.5–5.1)
Sodium: 140 mmol/L (ref 135–145)
TCO2: 25 mmol/L (ref 22–32)

## 2021-09-15 MED ORDER — CEPHALEXIN 500 MG PO CAPS
500.0000 mg | ORAL_CAPSULE | Freq: Once | ORAL | Status: AC
  Administered 2021-09-15: 500 mg via ORAL
  Filled 2021-09-15: qty 1

## 2021-09-15 MED ORDER — CEPHALEXIN 500 MG PO CAPS: 500.0000 mg | ORAL_CAPSULE | Freq: Four times a day (QID) | ORAL | 0 refills | Status: DC

## 2021-09-15 NOTE — ED Provider Notes (Signed)
[facility] DEPT Provider Note   CSN: [id number] Arrival date & time: 09/15/21  1615     History Chief Complaint  Patient presents with   Foley Catheter problem    Douglas Walker is a 87 y.o. male.  Patient is a Foley catheter in place.  Patient had severe pain over her bladder earlier today which is resolved.  The history is provided by the patient and medical records. No language interpreter was used.  Abdominal Pain Pain location:  Suprapubic Pain quality: aching   Pain radiates to:  Does not radiate Pain severity:  Mild Onset quality:  Sudden Timing:  Intermittent Progression:  Resolved Chronicity:  New Context: not alcohol use   Relieved by:  Nothing Associated symptoms: no chest pain, no cough, no diarrhea, no fatigue and no hematuria       Past Medical History:  Diagnosis Date   Arthritis    shoulders and back   Cancer ([place])    skin cancers   Chronic low back pain 12/15/2017   CKD (chronic kidney disease) stage 3, GFR 30-59 ml/min (HCC)    Coronary artery disease    Dysrhythmia     Paroxysmal atrial fibrillation; Tachycardia-bradycardia syndrome   Essential tremor 02/28/2016   GERD (gastroesophageal reflux disease)    Hernia    History of hiatal hernia    Hyperlipemia    Hypertension    Macular degeneration    Neuromuscular disorder (HCC)    neuropathy   Paroxysmal atrial fibrillation ([place]) 10/28/2013   Presence of permanent cardiac pacemaker 12/04/2018   Stroke ([place]) 12/2020   Tachycardia-bradycardia syndrome ([place]) 09/13/2014   TIA (transient ischemic attack)     Patient Active Problem List   Diagnosis Date Noted   AKI (acute kidney injury) ([place]) 07/06/2021   UTI (urinary tract infection) 07/02/2021   Pain in both testicles    Right hemiparesis ([place]) 04/23/2021   Cerebrovascular accident (CVA) due to embolism of left middle cerebral artery ([place]) 04/23/2021   Neuropathy 04/23/2021   Abnormality of gait  04/23/2021   Abnormal results of thyroid function studies 04/04/2021   Acquired iron deficiency anemia due to decreased absorption 04/04/2021   Acquired thrombophilia ([place]) 04/04/2021   Cerebral atherosclerosis 04/04/2021   Basal cell carcinoma of nose 04/04/2021   Benign hypertensive heart and renal disease, with heart and renal failure ([place]) 04/04/2021   Benign prostatic hyperplasia 04/04/2021   Obesity 04/04/2021   Bradycardia 04/04/2021   Closed fracture of one rib 04/04/2021   Constipation by delayed colonic transit 04/04/2021   Fatigue 04/04/2021   Gastro-esophageal reflux disease without esophagitis 04/04/2021   Hypothyroidism 04/04/2021   Left lower quadrant pain 04/04/2021   Lumbar radiculopathy 04/04/2021   Reactive depression 04/04/2021   Malignant neoplasm of anterior wall of urinary bladder ([place]) 04/04/2021   Mass of chest wall 04/04/2021   Mixed hyperlipidemia 04/04/2021   Nocturia 04/04/2021   Other shoulder lesions, right shoulder 04/04/2021   Pain in limb 04/04/2021   Peripheral edema 04/04/2021   Personal history of malignant neoplasm of bladder 04/04/2021   Pruritus of genitalia 04/04/2021   Tremor 04/04/2021   Unilateral primary osteoarthritis, left knee 04/04/2021   Unspecified mononeuropathy of unspecified lower limb 04/04/2021   Urinary incontinence 04/04/2021   Dysphagia, post-stroke    Chronic diastolic congestive heart failure (HCC)    Chronic pain syndrome    Left middle cerebral artery stroke ([place]) 02/01/2021   Acute CVA (cerebrovascular accident) ([place]) 01/28/2021   Pacemaker  08/30/2020   Hematoma, chest wall 07/24/2020   Exudative age-related macular degeneration of right eye with inactive choroidal neovascularization ([place]) 05/10/2020   Exudative age-related macular degeneration of left eye with inactive choroidal neovascularization ([place]) 05/10/2020   Retinal hemorrhage of right eye 05/10/2020   Exposure keratopathy, bilateral 05/10/2020    Bilateral dry eyes 05/10/2020   Advanced nonexudative age-related macular degeneration of both eyes with subfoveal involvement 05/10/2020   Sick sinus syndrome ([place]) 12/06/2018   Complete heart block (HCC) 12/04/2018   Chronic low back pain 12/15/2017   Hereditary and idiopathic peripheral neuropathy 07/01/2016   Essential tremor 02/28/2016   BPPV (benign paroxysmal positional vertigo) 06/19/2015   Dizziness 06/18/2015   Bacteria in urine 06/18/2015   CKD (chronic kidney disease) stage 3, GFR 30-59 ml/min: Probable 06/18/2015   Chronic diastolic CHF (congestive heart failure) ([place]) 05/30/2015   Fall    Syncope 05/19/2015   Lower urinary tract infectious disease 05/19/2015   Hypotension 05/19/2015   Dehydration 05/19/2015   Fracture of rib of left side 05/19/2015   Syncope and collapse 05/19/2015   Mobitz type II atrioventricular block 09/13/2014   Essential hypertension 09/13/2014   Chronic anticoagulation 09/13/2014   Tachycardia-bradycardia syndrome ([place]) 09/13/2014   Paroxysmal atrial fibrillation ([place]) 10/28/2013   Thrombocytopenia ([place]) 04/06/2013    Past Surgical History:  Procedure Laterality Date   APPENDECTOMY     EYE SURGERY     HERNIA REPAIR     HERNIA REPAIR     INSERT / REPLACE / REMOVE PACEMAKER  12/04/2018   IRRIGATION AND DEBRIDEMENT ABSCESS Right 07/24/2020   Procedure: IRRIGATION AND DEBRIDEMENT HEMATOMA;  Surgeon: Coralie Keens, MD;  Location: [facility];  Service: General;  Laterality: Right;   MASS EXCISION Right 07/20/2020   Procedure: EXCISION OF RIGHT CHEST WALL MASS;  Surgeon: Stark Klein, MD;  Location: [facility];  Service: General;  Laterality: Right;   PACEMAKER IMPLANT N/A 12/04/2018   Procedure: PACEMAKER IMPLANT;  Surgeon: Evans Lance, MD;  Location: [facility];  Service: Cardiovascular;  Laterality: N/A;       Family History  Problem Relation Age of Onset   GI problems Mother    Other Sister        PAIN ISSUES   Hearing loss  Sister    Blindness Sister    Heart attack Neg Hx    Stroke Neg Hx     Social History   Tobacco Use   Smoking status: Former    Types: Cigarettes    Quit date: 12/30/1962    Years since quitting: 58.7   Smokeless tobacco: Never  Vaping Use   Vaping Use: Never used  Substance Use Topics   Alcohol use: No   Drug use: No    Home Medications Prior to Admission medications   Medication Sig Start Date End Date Taking? Authorizing Provider  cephALEXin (KEFLEX) 500 MG capsule Take 1 capsule (500 mg total) by mouth 4 (four) times daily. 09/15/21  Yes Milton Ferguson, MD  acetaminophen (TYLENOL) 325 MG tablet Take 2 tablets (650 mg total) by mouth every 4 (four) hours as needed for mild pain (or temp > 37.5 C (99.5 F)). 02/14/21   Angiulli, Lavon Paganini, PA-C  DULoxetine (CYMBALTA) 60 MG capsule Take 1 capsule (60 mg total) by mouth daily. 04/23/21   Lovorn, Jinny Blossom, MD  finasteride (PROSCAR) 5 MG tablet Take 1 tablet (5 mg total) by mouth daily. 02/14/21   Angiulli, Lavon Paganini, PA-C  furosemide (LASIX) 20  MG tablet Take 2 tablets (40 mg total) by mouth daily. Patient taking differently: Take 60 mg by mouth daily. 02/14/21   Angiulli, Lavon Paganini, PA-C  levothyroxine (SYNTHROID) 25 MCG tablet Take 1 tablet (25 mcg total) by mouth daily before breakfast. 02/14/21   Angiulli, Lavon Paganini, PA-C  loratadine (CLARITIN) 10 MG tablet Take 10 mg by mouth daily.    [provider]  Multiple Vitamin (MULTIVITAMIN WITH MINERALS) TABS tablet Take 1 tablet by mouth daily.    [provider]  Multiple Vitamins-Minerals (OCUVITE ADULT 50+) CAPS Take 1 capsule by mouth daily.    [provider]  nitrofurantoin, macrocrystal-monohydrate, (MACROBID) 100 MG capsule Take 100 mg by mouth daily. 06/07/21   [provider]  polyethylene glycol (MIRALAX / GLYCOLAX) 17 g packet Take 17 g by mouth daily. 07/08/21   Mariel Aloe, MD  pregabalin (LYRICA) 25 MG capsule TAKE 1 CAPSULE(25 MG) BY MOUTH AT  BEDTIME 02/14/21   Angiulli, Lavon Paganini, PA-C  primidone (MYSOLINE) 250 MG tablet Take 1 tablet (250 mg total) by mouth 2 (two) times daily. 02/14/21   Angiulli, Lavon Paganini, PA-C  senna (SENOKOT) 8.6 MG tablet Take 1 tablet by mouth daily as needed for constipation.    [provider]  tamsulosin (FLOMAX) 0.4 MG CAPS capsule Take 1 capsule (0.4 mg total) by mouth 2 (two) times daily. 02/14/21   Angiulli, Lavon Paganini, PA-C  topiramate (TOPAMAX) 50 MG tablet Take 1 tablet (50 mg total) by mouth 2 (two) times daily. 02/14/21   Angiulli, Lavon Paganini, PA-C  traMADol (ULTRAM) 50 MG tablet Take 1 tablet (50 mg total) by mouth every 6 (six) hours as needed for severe pain (if tylenol not effective). 02/14/21   Angiulli, Lavon Paganini, PA-C  vitamin B-12 (CYANOCOBALAMIN) 1000 MCG tablet Take 1 tablet (1,000 mcg total) by mouth every other day. 02/14/21   Angiulli, Lavon Paganini, PA-C  warfarin (COUMADIN) 5 MG tablet Take 1 to 1.5 tablets daily as directed by Coumadin clinic 08/14/21   Belva Crome, MD    Allergies    Sulfa antibiotics, Primidone, and Simvastatin  Review of Systems   Review of Systems  Constitutional:  Negative for appetite change and fatigue.  HENT:  Negative for congestion, ear discharge and sinus pressure.   Eyes:  Negative for discharge.  Respiratory:  Negative for cough.   Cardiovascular:  Negative for chest pain.  Gastrointestinal:  Positive for abdominal pain. Negative for diarrhea.  Genitourinary:  Negative for frequency and hematuria.  Musculoskeletal:  Negative for back pain.  Skin:  Negative for rash.  Neurological:  Negative for seizures and headaches.  Psychiatric/Behavioral:  Negative for hallucinations.    Physical Exam Updated Vital Signs BP (!) 150/72 (BP Location: Right Arm)   Pulse 70   Temp 98 F (36.7 C) (Oral)   Resp 20   SpO2 98%   Physical Exam Vitals and nursing note reviewed.  Constitutional:      Appearance: He is well-developed.  HENT:     Head:  Normocephalic.     Mouth/Throat:     Mouth: Mucous membranes are moist.  Eyes:     General: No scleral icterus.    Conjunctiva/sclera: Conjunctivae normal.  Neck:     Thyroid: No thyromegaly.  Cardiovascular:     Rate and Rhythm: Normal rate and regular rhythm.     Heart sounds: No murmur heard.   No friction rub. No gallop.  Pulmonary:     Breath sounds:  No stridor. No wheezing or rales.  Chest:     Chest wall: No tenderness.  Abdominal:     General: There is no distension.     Tenderness: There is no abdominal tenderness. There is no rebound.  Musculoskeletal:        General: Normal range of motion.     Cervical back: Neck supple.  Lymphadenopathy:     Cervical: No cervical adenopathy.  Skin:    Findings: No erythema or rash.  Neurological:     Mental Status: He is alert and oriented to person, place, and time.     Motor: No abnormal muscle tone.     Coordination: Coordination normal.  Psychiatric:        Behavior: Behavior normal.    ED Results / Procedures / Treatments   Labs (all labs ordered are listed, but only abnormal results are displayed) Labs Reviewed  URINALYSIS, ROUTINE W REFLEX MICROSCOPIC - Abnormal; Notable for the following components:      Result Value   Color, Urine ORANGE (*)    APPearance CLEAR (*)    Hgb urine dipstick TRACE (*)    Nitrite POSITIVE (*)    Leukocytes,Ua LARGE (*)    WBC, UA >50 (*)    Bacteria, UA RARE (*)    All other components within normal limits  I-STAT CHEM 8, ED - Abnormal; Notable for the following components:   BUN 24 (*)    Creatinine, Ser 1.60 (*)    Hemoglobin 12.2 (*)    HCT 36.0 (*)    All other components within normal limits  URINE CULTURE    EKG None  Radiology No results found.  Procedures Procedures   Medications Ordered in ED Medications  cephALEXin (KEFLEX) capsule 500 mg (has no administration in time range)    ED Course  I have reviewed the triage vital signs and the nursing  notes.  Pertinent labs & imaging results that were available during my care of the patient were reviewed by me and considered in my medical decision making (see chart for details).    MDM Rules/Calculators/A&P                           Patient with a possible urinary tract infection with Foley catheter in place.  His urine is cultured he is empirically started on Keflex and will follow up with urologist next few days Final Clinical Impression(s) / ED Diagnoses Final diagnoses:  Acute cystitis with hematuria    Rx / DC Orders ED Discharge Orders          Ordered    cephALEXin (KEFLEX) 500 MG capsule  4 times daily        09/15/21 2107             Milton Ferguson, MD 09/15/21 2112

## 2021-09-15 NOTE — ED Provider Notes (Signed)
Emergency Medicine Provider Triage Evaluation Note  Douglas Walker , a 87 y.o. male  was evaluated in triage.  Pt complains of foley complication.  Review of Systems  Positive: Pain at foley site Negative: Fever, abd pain  Physical Exam  BP (!) 156/82 (BP Location: Left Arm)   Pulse 69   Temp 98 F (36.7 C) (Oral)   Resp 18   SpO2 96%  Gen:   Awake, no distress   Resp:  Normal effort  MSK:   Moves extremities without difficulty  Other:  Chaperone present.  Foley catheter in place, with leg bag holding 300cc straw color urine.  Abd soft and nondistended.  Medical Decision Making  Medically screening exam initiated at 4:49 PM.  Appropriate orders placed.  Douglas Walker was informed that the remainder of the evaluation will be completed by another provider, this initial triage assessment does not replace that evaluation, and the importance of remaining in the ED until their evaluation is complete.  Pt has indwelling foley, last changed Sept 1st.  Had it due to recurrent UTI according to son.  This AM pt appears in pain, screaming, urine was filled on the bed and in his Depends.  Urology was called, felt it could be due to the catheter balloon migrating to urethra causing discomfort, but subsequently retract back to bladder.  He is current sxs free.     Douglas Moras, PA-C 09/15/21 1652    Douglas Ferguson, MD 09/17/21 1734

## 2021-09-15 NOTE — ED Triage Notes (Signed)
Pt arrives with Foley in place. Son states he woke up with some suprapubic pain. Was concerned the foley was blocked. Foley appears to be draining and pt reports pain has resolved.

## 2021-09-15 NOTE — Discharge Instructions (Addendum)
Follow-up with your urologist next week °

## 2021-09-17 LAB — URINE CULTURE

## 2021-09-28 ENCOUNTER — Ambulatory Visit (INDEPENDENT_AMBULATORY_CARE_PROVIDER_SITE_OTHER): Payer: Medicare Other | Admitting: *Deleted

## 2021-09-28 ENCOUNTER — Other Ambulatory Visit: Payer: Self-pay

## 2021-09-28 DIAGNOSIS — I480 Paroxysmal atrial fibrillation: Secondary | ICD-10-CM | POA: Diagnosis not present

## 2021-09-28 DIAGNOSIS — Z5181 Encounter for therapeutic drug level monitoring: Secondary | ICD-10-CM

## 2021-09-28 LAB — POCT INR: INR: 2 (ref 2.0–3.0)

## 2021-09-28 NOTE — Patient Instructions (Signed)
Description   Take 2 tablets of warfarin today and then start taking warfarin 1.5 tablets daily except 2 tablets on Fridays. Recheck INR in 3 weeks. Coumadin Clinic [phone].

## 2021-10-02 DIAGNOSIS — R338 Other retention of urine: Secondary | ICD-10-CM | POA: Diagnosis not present

## 2021-10-05 DIAGNOSIS — Z23 Encounter for immunization: Secondary | ICD-10-CM | POA: Diagnosis not present

## 2021-10-08 ENCOUNTER — Inpatient Hospital Stay (HOSPITAL_COMMUNITY)
Admission: EM | Admit: 2021-10-08 | Discharge: 2021-10-15 | DRG: 698 | Disposition: A | Discharge status: Skilled Nursing Facility | Payer: Medicare Other | Attending: Family Medicine | Admitting: Family Medicine

## 2021-10-08 ENCOUNTER — Emergency Department (HOSPITAL_COMMUNITY): Payer: Medicare Other

## 2021-10-08 ENCOUNTER — Encounter (HOSPITAL_COMMUNITY): Payer: Self-pay

## 2021-10-08 ENCOUNTER — Other Ambulatory Visit: Payer: Self-pay

## 2021-10-08 DIAGNOSIS — T83511A Infection and inflammatory reaction due to indwelling urethral catheter, initial encounter: Secondary | ICD-10-CM | POA: Diagnosis not present

## 2021-10-08 DIAGNOSIS — I5032 Chronic diastolic (congestive) heart failure: Secondary | ICD-10-CM | POA: Diagnosis present

## 2021-10-08 DIAGNOSIS — Z1629 Resistance to other single specified antibiotic: Secondary | ICD-10-CM | POA: Diagnosis present

## 2021-10-08 DIAGNOSIS — Z23 Encounter for immunization: Secondary | ICD-10-CM

## 2021-10-08 DIAGNOSIS — A4159 Other Gram-negative sepsis: Secondary | ICD-10-CM | POA: Diagnosis not present

## 2021-10-08 DIAGNOSIS — Z79899 Other long term (current) drug therapy: Secondary | ICD-10-CM

## 2021-10-08 DIAGNOSIS — Z888 Allergy status to other drugs, medicaments and biological substances status: Secondary | ICD-10-CM

## 2021-10-08 DIAGNOSIS — R251 Tremor, unspecified: Secondary | ICD-10-CM | POA: Diagnosis not present

## 2021-10-08 DIAGNOSIS — R5081 Fever presenting with conditions classified elsewhere: Secondary | ICD-10-CM

## 2021-10-08 DIAGNOSIS — A419 Sepsis, unspecified organism: Secondary | ICD-10-CM | POA: Diagnosis present

## 2021-10-08 DIAGNOSIS — N400 Enlarged prostate without lower urinary tract symptoms: Secondary | ICD-10-CM | POA: Diagnosis present

## 2021-10-08 DIAGNOSIS — Z882 Allergy status to sulfonamides status: Secondary | ICD-10-CM

## 2021-10-08 DIAGNOSIS — Z7901 Long term (current) use of anticoagulants: Secondary | ICD-10-CM

## 2021-10-08 DIAGNOSIS — Y846 Urinary catheterization as the cause of abnormal reaction of the patient, or of later complication, without mention of misadventure at the time of the procedure: Secondary | ICD-10-CM | POA: Diagnosis present

## 2021-10-08 DIAGNOSIS — Z8551 Personal history of malignant neoplasm of bladder: Secondary | ICD-10-CM

## 2021-10-08 DIAGNOSIS — J9601 Acute respiratory failure with hypoxia: Secondary | ICD-10-CM | POA: Diagnosis present

## 2021-10-08 DIAGNOSIS — I130 Hypertensive heart and chronic kidney disease with heart failure and stage 1 through stage 4 chronic kidney disease, or unspecified chronic kidney disease: Secondary | ICD-10-CM | POA: Diagnosis present

## 2021-10-08 DIAGNOSIS — Z20822 Contact with and (suspected) exposure to covid-19: Secondary | ICD-10-CM | POA: Diagnosis not present

## 2021-10-08 DIAGNOSIS — I480 Paroxysmal atrial fibrillation: Secondary | ICD-10-CM | POA: Diagnosis present

## 2021-10-08 DIAGNOSIS — Z66 Do not resuscitate: Secondary | ICD-10-CM | POA: Diagnosis not present

## 2021-10-08 DIAGNOSIS — I9589 Other hypotension: Secondary | ICD-10-CM

## 2021-10-08 DIAGNOSIS — D696 Thrombocytopenia, unspecified: Secondary | ICD-10-CM | POA: Diagnosis present

## 2021-10-08 DIAGNOSIS — Z792 Long term (current) use of antibiotics: Secondary | ICD-10-CM

## 2021-10-08 DIAGNOSIS — Z87440 Personal history of urinary (tract) infections: Secondary | ICD-10-CM

## 2021-10-08 DIAGNOSIS — E039 Hypothyroidism, unspecified: Secondary | ICD-10-CM | POA: Diagnosis present

## 2021-10-08 DIAGNOSIS — Z87891 Personal history of nicotine dependence: Secondary | ICD-10-CM

## 2021-10-08 DIAGNOSIS — R6520 Severe sepsis without septic shock: Secondary | ICD-10-CM | POA: Diagnosis present

## 2021-10-08 DIAGNOSIS — I63412 Cerebral infarction due to embolism of left middle cerebral artery: Secondary | ICD-10-CM | POA: Diagnosis present

## 2021-10-08 DIAGNOSIS — N179 Acute kidney failure, unspecified: Secondary | ICD-10-CM | POA: Diagnosis present

## 2021-10-08 DIAGNOSIS — Z8673 Personal history of transient ischemic attack (TIA), and cerebral infarction without residual deficits: Secondary | ICD-10-CM

## 2021-10-08 DIAGNOSIS — N1832 Chronic kidney disease, stage 3b: Secondary | ICD-10-CM | POA: Diagnosis present

## 2021-10-08 DIAGNOSIS — Z79890 Hormone replacement therapy (postmenopausal): Secondary | ICD-10-CM

## 2021-10-08 DIAGNOSIS — K5900 Constipation, unspecified: Secondary | ICD-10-CM | POA: Diagnosis present

## 2021-10-08 DIAGNOSIS — B964 Proteus (mirabilis) (morganii) as the cause of diseases classified elsewhere: Secondary | ICD-10-CM | POA: Diagnosis present

## 2021-10-08 DIAGNOSIS — N183 Chronic kidney disease, stage 3 unspecified: Secondary | ICD-10-CM | POA: Diagnosis present

## 2021-10-08 DIAGNOSIS — R7881 Bacteremia: Secondary | ICD-10-CM

## 2021-10-08 DIAGNOSIS — R509 Fever, unspecified: Secondary | ICD-10-CM | POA: Diagnosis not present

## 2021-10-08 DIAGNOSIS — J181 Lobar pneumonia, unspecified organism: Secondary | ICD-10-CM | POA: Diagnosis not present

## 2021-10-08 DIAGNOSIS — N1831 Chronic kidney disease, stage 3a: Secondary | ICD-10-CM | POA: Diagnosis present

## 2021-10-08 DIAGNOSIS — R0902 Hypoxemia: Secondary | ICD-10-CM | POA: Diagnosis not present

## 2021-10-08 DIAGNOSIS — Z950 Presence of cardiac pacemaker: Secondary | ICD-10-CM

## 2021-10-08 DIAGNOSIS — G250 Essential tremor: Secondary | ICD-10-CM | POA: Diagnosis present

## 2021-10-08 DIAGNOSIS — N12 Tubulo-interstitial nephritis, not specified as acute or chronic: Secondary | ICD-10-CM | POA: Diagnosis present

## 2021-10-08 DIAGNOSIS — I495 Sick sinus syndrome: Secondary | ICD-10-CM | POA: Diagnosis present

## 2021-10-08 DIAGNOSIS — I77810 Thoracic aortic ectasia: Secondary | ICD-10-CM | POA: Diagnosis not present

## 2021-10-08 DIAGNOSIS — I959 Hypotension, unspecified: Secondary | ICD-10-CM | POA: Diagnosis not present

## 2021-10-08 LAB — URINALYSIS, ROUTINE W REFLEX MICROSCOPIC
Glucose, UA: NEGATIVE mg/dL
Hgb urine dipstick: NEGATIVE
Ketones, ur: NEGATIVE mg/dL
Nitrite: NEGATIVE
Protein, ur: 100 mg/dL — AB
Specific Gravity, Urine: 1.018 (ref 1.005–1.030)
pH: 8 (ref 5.0–8.0)

## 2021-10-08 LAB — COMPREHENSIVE METABOLIC PANEL
ALT: 33 U/L (ref 0–44)
AST: 32 U/L (ref 15–41)
Albumin: 4.3 g/dL (ref 3.5–5.0)
Alkaline Phosphatase: 108 U/L (ref 38–126)
Anion gap: 8 (ref 5–15)
BUN: 33 mg/dL — ABNORMAL HIGH (ref 8–23)
CO2: 28 mmol/L (ref 22–32)
Calcium: 9.2 mg/dL (ref 8.9–10.3)
Chloride: 100 mmol/L (ref 98–111)
Creatinine, Ser: 1.51 mg/dL — ABNORMAL HIGH (ref 0.61–1.24)
GFR, Estimated: 43 mL/min — ABNORMAL LOW (ref >60)
Glucose, Bld: 96 mg/dL (ref 70–99)
Potassium: 4.2 mmol/L (ref 3.5–5.1)
Sodium: 136 mmol/L (ref 135–145)
Total Bilirubin: 0.7 mg/dL (ref 0.3–1.2)
Total Protein: 7.6 g/dL (ref 6.5–8.1)

## 2021-10-08 LAB — CBC WITH DIFFERENTIAL/PLATELET
Abs Immature Granulocytes: 0.05 10*3/uL (ref 0.00–0.07)
Basophils Absolute: 0 10*3/uL (ref 0.0–0.1)
Basophils Relative: 0 %
Eosinophils Absolute: 0 10*3/uL (ref 0.0–0.5)
Eosinophils Relative: 0 %
HCT: 37 % — ABNORMAL LOW (ref 39.0–52.0)
Hemoglobin: 12.1 g/dL — ABNORMAL LOW (ref 13.0–17.0)
Immature Granulocytes: 1 %
Lymphocytes Relative: 3 %
Lymphs Abs: 0.3 10*3/uL — ABNORMAL LOW (ref 0.7–4.0)
MCH: 33.2 pg (ref 26.0–34.0)
MCHC: 32.7 g/dL (ref 30.0–36.0)
MCV: 101.4 fL — ABNORMAL HIGH (ref 80.0–100.0)
Monocytes Absolute: 0.5 10*3/uL (ref 0.1–1.0)
Monocytes Relative: 5 %
Neutro Abs: 8.8 10*3/uL — ABNORMAL HIGH (ref 1.7–7.7)
Neutrophils Relative %: 91 %
Platelets: 107 10*3/uL — ABNORMAL LOW (ref 150–400)
RBC: 3.65 MIL/uL — ABNORMAL LOW (ref 4.22–5.81)
RDW: 15 % (ref 11.5–15.5)
WBC: 9.7 10*3/uL (ref 4.0–10.5)
nRBC: 0 % (ref 0.0–0.2)

## 2021-10-08 LAB — APTT: aPTT: 39 seconds — ABNORMAL HIGH (ref 24–36)

## 2021-10-08 LAB — RESP PANEL BY RT-PCR (FLU A&B, COVID) ARPGX2
Influenza A by PCR: NEGATIVE
Influenza B by PCR: NEGATIVE
SARS Coronavirus 2 by RT PCR: NEGATIVE

## 2021-10-08 LAB — PROTIME-INR
INR: 2.3 — ABNORMAL HIGH (ref 0.8–1.2)
Prothrombin Time: 25.1 seconds — ABNORMAL HIGH (ref 11.4–15.2)

## 2021-10-08 LAB — LACTIC ACID, PLASMA: Lactic Acid, Venous: 2.1 mmol/L (ref 0.5–1.9)

## 2021-10-08 NOTE — ED Provider Notes (Signed)
Emergency Medicine Provider Triage Evaluation Note  JEFFERSON FULLAM , a 87 y.o. male  was evaluated in triage.  Pt complains of chills and rigors.  Patient brought to emergency department by EMS from home.  Patient lives with son.  Patient received COVID booster on Friday.  Review of Systems  Positive: Rigors, chills, fevers Negative:   Physical Exam  BP (!) 105/56 (BP Location: Right Arm)   Pulse 80   Temp (!) 102.6 F (39.2 C) (Oral)   Resp (!) 26   SpO2 94%  Gen:   Awake, no distress   Resp:  Normal effort, respiratory wheezing noted to bilateral lower lobes MSK:   Moves extremities without difficulty  Other:  Abdomen soft, nondistended, nontender  Medical Decision Making  Medically screening exam initiated at 10:02 PM.  Appropriate orders placed.  [name] was informed that the remainder of the evaluation will be completed by another provider, this initial triage assessment does not replace that evaluation, and the importance of remaining in the ED until their evaluation is complete.  Patient febrile with temperature of 102.6, will start ED evolving sepsis at this time.   Loni Beckwith, PA-C 10/08/21 2205    Valarie Merino, MD 10/08/21 [phone]

## 2021-10-08 NOTE — ED Triage Notes (Signed)
Pt BIB EMS from home. Pt received his Covid booster on Friday and is complaining of being cold and having increased tremors. Pt is taking abts, EMS is unsure about what for. Pt's son is on the way. Pt has an indwelling catheter in place.   [name] [phone]

## 2021-10-08 NOTE — ED Notes (Signed)
Patient transported to X-ray 

## 2021-10-09 ENCOUNTER — Encounter (HOSPITAL_COMMUNITY): Payer: Self-pay | Admitting: Family Medicine

## 2021-10-09 DIAGNOSIS — R6520 Severe sepsis without septic shock: Secondary | ICD-10-CM | POA: Diagnosis present

## 2021-10-09 DIAGNOSIS — J9601 Acute respiratory failure with hypoxia: Secondary | ICD-10-CM | POA: Diagnosis present

## 2021-10-09 DIAGNOSIS — I495 Sick sinus syndrome: Secondary | ICD-10-CM | POA: Diagnosis present

## 2021-10-09 DIAGNOSIS — B964 Proteus (mirabilis) (morganii) as the cause of diseases classified elsewhere: Secondary | ICD-10-CM | POA: Diagnosis present

## 2021-10-09 DIAGNOSIS — Z743 Need for continuous supervision: Secondary | ICD-10-CM | POA: Diagnosis not present

## 2021-10-09 DIAGNOSIS — Z23 Encounter for immunization: Secondary | ICD-10-CM | POA: Diagnosis not present

## 2021-10-09 DIAGNOSIS — N12 Tubulo-interstitial nephritis, not specified as acute or chronic: Secondary | ICD-10-CM | POA: Diagnosis present

## 2021-10-09 DIAGNOSIS — N390 Urinary tract infection, site not specified: Secondary | ICD-10-CM | POA: Diagnosis present

## 2021-10-09 DIAGNOSIS — E039 Hypothyroidism, unspecified: Secondary | ICD-10-CM | POA: Diagnosis present

## 2021-10-09 DIAGNOSIS — Z87440 Personal history of urinary (tract) infections: Secondary | ICD-10-CM | POA: Diagnosis not present

## 2021-10-09 DIAGNOSIS — T83511A Infection and inflammatory reaction due to indwelling urethral catheter, initial encounter: Secondary | ICD-10-CM | POA: Diagnosis present

## 2021-10-09 DIAGNOSIS — Z8673 Personal history of transient ischemic attack (TIA), and cerebral infarction without residual deficits: Secondary | ICD-10-CM | POA: Diagnosis not present

## 2021-10-09 DIAGNOSIS — R2689 Other abnormalities of gait and mobility: Secondary | ICD-10-CM | POA: Diagnosis not present

## 2021-10-09 DIAGNOSIS — N400 Enlarged prostate without lower urinary tract symptoms: Secondary | ICD-10-CM | POA: Diagnosis not present

## 2021-10-09 DIAGNOSIS — A4159 Other Gram-negative sepsis: Secondary | ICD-10-CM | POA: Diagnosis present

## 2021-10-09 DIAGNOSIS — I480 Paroxysmal atrial fibrillation: Secondary | ICD-10-CM | POA: Diagnosis present

## 2021-10-09 DIAGNOSIS — A419 Sepsis, unspecified organism: Secondary | ICD-10-CM | POA: Diagnosis present

## 2021-10-09 DIAGNOSIS — Z7901 Long term (current) use of anticoagulants: Secondary | ICD-10-CM | POA: Diagnosis not present

## 2021-10-09 DIAGNOSIS — Z79890 Hormone replacement therapy (postmenopausal): Secondary | ICD-10-CM | POA: Diagnosis not present

## 2021-10-09 DIAGNOSIS — I63412 Cerebral infarction due to embolism of left middle cerebral artery: Secondary | ICD-10-CM | POA: Diagnosis not present

## 2021-10-09 DIAGNOSIS — Z20822 Contact with and (suspected) exposure to covid-19: Secondary | ICD-10-CM | POA: Diagnosis present

## 2021-10-09 DIAGNOSIS — Z66 Do not resuscitate: Secondary | ICD-10-CM | POA: Diagnosis present

## 2021-10-09 DIAGNOSIS — D696 Thrombocytopenia, unspecified: Secondary | ICD-10-CM | POA: Diagnosis present

## 2021-10-09 DIAGNOSIS — R278 Other lack of coordination: Secondary | ICD-10-CM | POA: Diagnosis not present

## 2021-10-09 DIAGNOSIS — Y846 Urinary catheterization as the cause of abnormal reaction of the patient, or of later complication, without mention of misadventure at the time of the procedure: Secondary | ICD-10-CM | POA: Diagnosis present

## 2021-10-09 DIAGNOSIS — M6281 Muscle weakness (generalized): Secondary | ICD-10-CM | POA: Diagnosis not present

## 2021-10-09 DIAGNOSIS — N179 Acute kidney failure, unspecified: Secondary | ICD-10-CM | POA: Diagnosis present

## 2021-10-09 DIAGNOSIS — I5032 Chronic diastolic (congestive) heart failure: Secondary | ICD-10-CM

## 2021-10-09 DIAGNOSIS — N1832 Chronic kidney disease, stage 3b: Secondary | ICD-10-CM | POA: Diagnosis present

## 2021-10-09 DIAGNOSIS — K5900 Constipation, unspecified: Secondary | ICD-10-CM | POA: Diagnosis present

## 2021-10-09 DIAGNOSIS — Z1629 Resistance to other single specified antibiotic: Secondary | ICD-10-CM | POA: Diagnosis present

## 2021-10-09 DIAGNOSIS — I9589 Other hypotension: Secondary | ICD-10-CM | POA: Diagnosis not present

## 2021-10-09 DIAGNOSIS — R7881 Bacteremia: Secondary | ICD-10-CM | POA: Diagnosis not present

## 2021-10-09 DIAGNOSIS — G250 Essential tremor: Secondary | ICD-10-CM | POA: Diagnosis present

## 2021-10-09 DIAGNOSIS — I130 Hypertensive heart and chronic kidney disease with heart failure and stage 1 through stage 4 chronic kidney disease, or unspecified chronic kidney disease: Secondary | ICD-10-CM | POA: Diagnosis present

## 2021-10-09 DIAGNOSIS — R5081 Fever presenting with conditions classified elsewhere: Secondary | ICD-10-CM | POA: Diagnosis not present

## 2021-10-09 DIAGNOSIS — R0902 Hypoxemia: Secondary | ICD-10-CM | POA: Diagnosis not present

## 2021-10-09 LAB — HEPATIC FUNCTION PANEL
ALT: 58 U/L — ABNORMAL HIGH (ref 0–44)
AST: 65 U/L — ABNORMAL HIGH (ref 15–41)
Albumin: 3.2 g/dL — ABNORMAL LOW (ref 3.5–5.0)
Alkaline Phosphatase: 94 U/L (ref 38–126)
Bilirubin, Direct: 0.3 mg/dL — ABNORMAL HIGH (ref 0.0–0.2)
Indirect Bilirubin: 0.5 mg/dL (ref 0.3–0.9)
Total Bilirubin: 0.8 mg/dL (ref 0.3–1.2)
Total Protein: 5.7 g/dL — ABNORMAL LOW (ref 6.5–8.1)

## 2021-10-09 LAB — BASIC METABOLIC PANEL
Anion gap: 5 (ref 5–15)
BUN: 36 mg/dL — ABNORMAL HIGH (ref 8–23)
CO2: 25 mmol/L (ref 22–32)
Calcium: 7.8 mg/dL — ABNORMAL LOW (ref 8.9–10.3)
Chloride: 107 mmol/L (ref 98–111)
Creatinine, Ser: 1.77 mg/dL — ABNORMAL HIGH (ref 0.61–1.24)
GFR, Estimated: 35 mL/min — ABNORMAL LOW (ref >60)
Glucose, Bld: 109 mg/dL — ABNORMAL HIGH (ref 70–99)
Potassium: 3.9 mmol/L (ref 3.5–5.1)
Sodium: 137 mmol/L (ref 135–145)

## 2021-10-09 LAB — CBC
HCT: 31.2 % — ABNORMAL LOW (ref 39.0–52.0)
Hemoglobin: 10.1 g/dL — ABNORMAL LOW (ref 13.0–17.0)
MCH: 33.3 pg (ref 26.0–34.0)
MCHC: 32.4 g/dL (ref 30.0–36.0)
MCV: 103 fL — ABNORMAL HIGH (ref 80.0–100.0)
Platelets: 82 10*3/uL — ABNORMAL LOW (ref 150–400)
RBC: 3.03 MIL/uL — ABNORMAL LOW (ref 4.22–5.81)
RDW: 15.2 % (ref 11.5–15.5)
WBC: 19.6 10*3/uL — ABNORMAL HIGH (ref 4.0–10.5)
nRBC: 0 % (ref 0.0–0.2)

## 2021-10-09 LAB — LACTIC ACID, PLASMA: Lactic Acid, Venous: 1.6 mmol/L (ref 0.5–1.9)

## 2021-10-09 LAB — MAGNESIUM: Magnesium: 1.9 mg/dL (ref 1.7–2.4)

## 2021-10-09 LAB — MRSA NEXT GEN BY PCR, NASAL: MRSA by PCR Next Gen: NOT DETECTED

## 2021-10-09 MED ORDER — ONDANSETRON HCL 4 MG/2ML IJ SOLN
4.0000 mg | Freq: Four times a day (QID) | INTRAMUSCULAR | Status: DC | PRN
  Administered 2021-10-11: 4 mg via INTRAVENOUS
  Filled 2021-10-09: qty 2

## 2021-10-09 MED ORDER — SODIUM CHLORIDE 0.9 % IV SOLN
1.0000 g | Freq: Once | INTRAVENOUS | Status: AC
  Administered 2021-10-09: 1 g via INTRAVENOUS
  Filled 2021-10-09: qty 10

## 2021-10-09 MED ORDER — CHLORHEXIDINE GLUCONATE CLOTH 2 % EX PADS
6.0000 | MEDICATED_PAD | Freq: Every day | CUTANEOUS | Status: DC
  Administered 2021-10-09 – 2021-10-14 (×6): 6 via TOPICAL

## 2021-10-09 MED ORDER — SODIUM CHLORIDE 0.9 % IV BOLUS (SEPSIS)
1000.0000 mL | Freq: Once | INTRAVENOUS | Status: AC
  Administered 2021-10-09: 1000 mL via INTRAVENOUS

## 2021-10-09 MED ORDER — WARFARIN - PHARMACIST DOSING INPATIENT: Freq: Every day | Status: DC

## 2021-10-09 MED ORDER — SODIUM CHLORIDE 0.9 % IV BOLUS
1000.0000 mL | Freq: Once | INTRAVENOUS | Status: AC
  Administered 2021-10-09: 1000 mL via INTRAVENOUS

## 2021-10-09 MED ORDER — PHENOL 1.4 % MT LIQD
1.0000 | OROMUCOSAL | Status: DC | PRN
  Administered 2021-10-09 – 2021-10-10 (×3): 1 via OROMUCOSAL
  Filled 2021-10-09: qty 177

## 2021-10-09 MED ORDER — SODIUM CHLORIDE 0.9 % IV SOLN: INTRAVENOUS | Status: AC

## 2021-10-09 MED ORDER — ACETAMINOPHEN 500 MG PO TABS
1000.0000 mg | ORAL_TABLET | Freq: Once | ORAL | Status: AC
  Administered 2021-10-09: 1000 mg via ORAL
  Filled 2021-10-09: qty 2

## 2021-10-09 MED ORDER — ACETAMINOPHEN 325 MG PO TABS
650.0000 mg | ORAL_TABLET | Freq: Four times a day (QID) | ORAL | Status: DC | PRN
  Administered 2021-10-09 – 2021-10-14 (×12): 650 mg via ORAL
  Filled 2021-10-09 (×13): qty 2

## 2021-10-09 MED ORDER — SODIUM CHLORIDE 0.9 % IV SOLN
1000.0000 mL | INTRAVENOUS | Status: DC
  Administered 2021-10-09: 1000 mL via INTRAVENOUS

## 2021-10-09 MED ORDER — ACETAMINOPHEN 650 MG RE SUPP: 650.0000 mg | Freq: Four times a day (QID) | RECTAL | Status: DC | PRN

## 2021-10-09 MED ORDER — SODIUM CHLORIDE 0.9 % IV SOLN
1.0000 g | Freq: Two times a day (BID) | INTRAVENOUS | Status: DC
  Administered 2021-10-09 – 2021-10-10 (×3): 1 g via INTRAVENOUS
  Filled 2021-10-09 (×4): qty 1

## 2021-10-09 MED ORDER — ONDANSETRON HCL 4 MG PO TABS: 4.0000 mg | ORAL_TABLET | Freq: Four times a day (QID) | ORAL | Status: DC | PRN

## 2021-10-09 MED ORDER — WARFARIN SODIUM 5 MG PO TABS
7.5000 mg | ORAL_TABLET | Freq: Once | ORAL | Status: AC
  Administered 2021-10-09: 7.5 mg via ORAL
  Filled 2021-10-09 (×2): qty 1

## 2021-10-09 NOTE — ED Notes (Signed)
Pt turned to R side and towels placed for stability.  O2 turned down to 1L [place].

## 2021-10-09 NOTE — Progress Notes (Signed)
ANTICOAGULATION CONSULT NOTE - Initial Consult  Pharmacy Consult for Warfarin Indication: atrial fibrillation  Allergies  Allergen Reactions   Sulfa Antibiotics Other (See Comments)    Weakness, dizziness   Primidone     Other reaction(s): increased sedation   Simvastatin     Other reaction(s): arthralgias and fatigue, severe    Patient Measurements: Height: 5' 9.5" (176.5 cm) Weight: 93 kg (205 lb) IBW/kg (Calculated) : 71.85   Vital Signs: Temp: 98.7 F (37.1 C) (10/11 0252) Temp Source: Oral (10/11 0252) BP: 106/52 (10/11 0500) Pulse Rate: 67 (10/11 0500)  Labs: Recent Labs    10/08/21 2202  HGB 12.1*  HCT 37.0*  PLT 107*  APTT 39*  LABPROT 25.1*  INR 2.3*  CREATININE 1.51*    Estimated Creatinine Clearance: 34 mL/min (A) (by C-G formula based on SCr of 1.51 mg/dL (H)).   Medical History: Past Medical History:  Diagnosis Date   Arthritis    shoulders and back   Cancer ([place])    skin cancers   Chronic low back pain 12/15/2017   CKD (chronic kidney disease) stage 3, GFR 30-59 ml/min (HCC)    Coronary artery disease    Dysrhythmia     Paroxysmal atrial fibrillation; Tachycardia-bradycardia syndrome   Essential tremor 02/28/2016   GERD (gastroesophageal reflux disease)    Hernia    History of hiatal hernia    Hyperlipemia    Hypertension    Macular degeneration    Neuromuscular disorder (HCC)    neuropathy   Paroxysmal atrial fibrillation ([place]) 10/28/2013   Presence of permanent cardiac pacemaker 12/04/2018   Stroke ([place]) 12/2020   Tachycardia-bradycardia syndrome ([place]) 09/13/2014   TIA (transient ischemic attack)     Medications:  PTA Warfarin  Assessment: 87 yr male admitted with suspected sepsis secondary to UTI with a history of ESBL infections.  PMH significant for chronic dCHF, PAF, CVS, CKD and thrombocytopenia Patient on warfarin PTA. Med history attempted around 3:15 AM at which point patient unable to tell technician warfarin regimen  or last dose  Goal of Therapy:  INR 2-3    Plan:  F/U with either completed med rec history 10/11 AM or with warfarin clinic to determine current warfarin regimen Order warfarin dose 10/11 once PTA regimen determined Daily PT/INR  Berdella Bacot, Toribio Harbour, PharmD 10/09/2021,5:47 AM

## 2021-10-09 NOTE — H&P (Signed)
History and Physical    AADIL SUR WGN:[id number] DOB: August 07, 1925 DOA: 10/08/2021  PCP: Josetta Huddle, MD   Patient coming from: Home   Chief Complaint: Shaking chills, confusion   HPI: Douglas Walker is a pleasant 87 y.o. male with medical history significant for sick sinus syndrome with pacer, paroxysmal atrial fibrillation on warfarin, history of CVA, chronic diastolic CHF, Foley catheter, and recurrent UTI with ESBL, now presenting to the emergency department with shaking chills and confusion.  Patient lives with his son and daughter-in-law who noted that he seemed to be a little confused yesterday and states that he developed shaking chills after dinner.  He had not been complaining of anything, did not appear to be short of breath or coughing, and there was no vomiting noted.  Patient was treated for UTI on 09/15/2021 after presenting to the emergency department with suprapubic pain.  He seemed to improve with Keflex at that time but family notes that his urine became malodorous within a couple days of completing the course.  ED Course: Upon arrival to the ED, patient is found to be febrile to 39.2 C, saturating upper 80s to mid 90s on room air, slightly tachypneic, and with systolic blood pressures low 70s.  EKG features sinus rhythm with PVC and LBBB.  Chest x-ray with left basilar atelectasis or scarring.  Chemistry panel with creatinine 1.51.  CBC with a slight microcytic anemia and chronic thrombocytopenia.  INR therapeutic at 2.3.  Initial lactic acid 2.1.  Blood and urine cultures were collected in the ED, 4 L of normal saline was bolused, and patient was given acetaminophen and 1 g IV Rocephin.  Review of Systems:  ROS limited by the patient's clinical condition.  Past Medical History:  Diagnosis Date   Arthritis    shoulders and back   Cancer ([place])    skin cancers   Chronic low back pain 12/15/2017   CKD (chronic kidney disease) stage 3, GFR 30-59 ml/min (HCC)     Coronary artery disease    Dysrhythmia     Paroxysmal atrial fibrillation; Tachycardia-bradycardia syndrome   Essential tremor 02/28/2016   GERD (gastroesophageal reflux disease)    Hernia    History of hiatal hernia    Hyperlipemia    Hypertension    Macular degeneration    Neuromuscular disorder (HCC)    neuropathy   Paroxysmal atrial fibrillation ([place]) 10/28/2013   Presence of permanent cardiac pacemaker 12/04/2018   Stroke ([place]) 12/2020   Tachycardia-bradycardia syndrome ([place]) 09/13/2014   TIA (transient ischemic attack)     Past Surgical History:  Procedure Laterality Date   [place] / REPLACE / REMOVE PACEMAKER  12/04/2018   IRRIGATION AND DEBRIDEMENT ABSCESS Right 07/24/2020   Procedure: IRRIGATION AND DEBRIDEMENT HEMATOMA;  Surgeon: Coralie Keens, MD;  Location: [facility];  Service: General;  Laterality: Right;   MASS EXCISION Right 07/20/2020   Procedure: EXCISION OF RIGHT CHEST WALL MASS;  Surgeon: Stark Klein, MD;  Location: [facility];  Service: General;  Laterality: Right;   PACEMAKER IMPLANT N/A 12/04/2018   Procedure: PACEMAKER IMPLANT;  Surgeon: Evans Lance, MD;  Location: [facility];  Service: Cardiovascular;  Laterality: N/A;    Social History:   reports that he quit smoking about 58 years ago. His smoking use included cigarettes. He has never used smokeless tobacco. He reports that  he does not drink alcohol and does not use drugs.  Allergies  Allergen Reactions   Sulfa Antibiotics Other (See Comments)    Weakness, dizziness   Primidone     Other reaction(s): increased sedation   Simvastatin     Other reaction(s): arthralgias and fatigue, severe    Family History  Problem Relation Age of Onset   GI problems Mother    Other Sister        PAIN ISSUES   Hearing loss Sister    Blindness Sister    Heart attack Neg Hx    Stroke Neg Hx      Prior to Admission medications    Medication Sig Start Date End Date Taking? Authorizing Provider  acetaminophen (TYLENOL) 325 MG tablet Take 2 tablets (650 mg total) by mouth every 4 (four) hours as needed for mild pain (or temp > 37.5 C (99.5 F)). 02/14/21   Angiulli, Lavon Paganini, PA-C  cephALEXin (KEFLEX) 500 MG capsule Take 1 capsule (500 mg total) by mouth 4 (four) times daily. 09/15/21   Milton Ferguson, MD  DULoxetine (CYMBALTA) 60 MG capsule Take 1 capsule (60 mg total) by mouth daily. 04/23/21   Lovorn, Jinny Blossom, MD  finasteride (PROSCAR) 5 MG tablet Take 1 tablet (5 mg total) by mouth daily. 02/14/21   Angiulli, Lavon Paganini, PA-C  furosemide (LASIX) 20 MG tablet Take 2 tablets (40 mg total) by mouth daily. Patient taking differently: Take 60 mg by mouth daily. 02/14/21   Angiulli, Lavon Paganini, PA-C  levothyroxine (SYNTHROID) 25 MCG tablet Take 1 tablet (25 mcg total) by mouth daily before breakfast. 02/14/21   Angiulli, Lavon Paganini, PA-C  loratadine (CLARITIN) 10 MG tablet Take 10 mg by mouth daily.    [provider]  Multiple Vitamin (MULTIVITAMIN WITH MINERALS) TABS tablet Take 1 tablet by mouth daily.    [provider]  Multiple Vitamins-Minerals (OCUVITE ADULT 50+) CAPS Take 1 capsule by mouth daily.    [provider]  nitrofurantoin, macrocrystal-monohydrate, (MACROBID) 100 MG capsule Take 100 mg by mouth daily. 06/07/21   [provider]  polyethylene glycol (MIRALAX / GLYCOLAX) 17 g packet Take 17 g by mouth daily. 07/08/21   Mariel Aloe, MD  pregabalin (LYRICA) 25 MG capsule TAKE 1 CAPSULE(25 MG) BY MOUTH AT BEDTIME 02/14/21   Angiulli, Lavon Paganini, PA-C  primidone (MYSOLINE) 250 MG tablet Take 1 tablet (250 mg total) by mouth 2 (two) times daily. 02/14/21   Angiulli, Lavon Paganini, PA-C  senna (SENOKOT) 8.6 MG tablet Take 1 tablet by mouth daily as needed for constipation.    [provider]  tamsulosin (FLOMAX) 0.4 MG CAPS capsule Take 1 capsule (0.4 mg total) by mouth 2 (two) times daily.  02/14/21   Angiulli, Lavon Paganini, PA-C  topiramate (TOPAMAX) 50 MG tablet Take 1 tablet (50 mg total) by mouth 2 (two) times daily. 02/14/21   Angiulli, Lavon Paganini, PA-C  traMADol (ULTRAM) 50 MG tablet Take 1 tablet (50 mg total) by mouth every 6 (six) hours as needed for severe pain (if tylenol not effective). 02/14/21   Angiulli, Lavon Paganini, PA-C  vitamin B-12 (CYANOCOBALAMIN) 1000 MCG tablet Take 1 tablet (1,000 mcg total) by mouth every other day. 02/14/21   Angiulli, Lavon Paganini, PA-C  warfarin (COUMADIN) 5 MG tablet Take 1 to 1.5 tablets daily as directed by Coumadin clinic 08/14/21   Belva Crome, MD    Physical Exam: Vitals:   10/09/21 0400 10/09/21 0415 10/09/21 0430 10/09/21  0500  BP: (!) 99/50 (!) 101/53 (!) 98/51 (!) 106/52  Pulse: 70 69 70 67  Resp: 16 18 16 16   Temp:      TempSrc:      SpO2: 94% 97% 93% 91%  Weight:      Height:        Constitutional: NAD, calm  Eyes: PERTLA, lids and conjunctivae normal ENMT: Mucous membranes are moist. Posterior pharynx clear of any exudate or lesions.   Neck: supple, no masses  Respiratory:  no wheezing, no crackles. No accessory muscle use.  Cardiovascular: S1 & S2 heard, regular rate and rhythm. Mild lower leg swelling bilaterally.   Abdomen: No distension, soft, no guarding. Bowel sounds active.  Musculoskeletal: no clubbing / cyanosis. No joint deformity upper and lower extremities.   Skin: no significant rashes, lesions, ulcers. Warm, dry, well-perfused. Neurologic: CN 2-12 grossly intact. Resting tremor. Moving all extremities. Alert and oriented to person and place only.  Psychiatric: Calm. Cooperative.    Labs and Imaging on Admission: I have personally reviewed following labs and imaging studies  CBC: Recent Labs  Lab 10/08/21 2202  WBC 9.7  NEUTROABS 8.8*  HGB 12.1*  HCT 37.0*  MCV 101.4*  PLT 841*   Basic Metabolic Panel: Recent Labs  Lab 10/08/21 2202  NA 136  K 4.2  CL 100  CO2 28  GLUCOSE 96  BUN 33*   CREATININE 1.51*  CALCIUM 9.2   GFR: Estimated Creatinine Clearance: 34 mL/min (A) (by C-G formula based on SCr of 1.51 mg/dL (H)). Liver Function Tests: Recent Labs  Lab 10/08/21 2202  AST 32  ALT 33  ALKPHOS 108  BILITOT 0.7  PROT 7.6  ALBUMIN 4.3   No results for input(s): LIPASE, AMYLASE in the last 168 hours. No results for input(s): AMMONIA in the last 168 hours. Coagulation Profile: Recent Labs  Lab 10/08/21 2202  INR 2.3*   Cardiac Enzymes: No results for input(s): CKTOTAL, CKMB, CKMBINDEX, TROPONINI in the last 168 hours. BNP (last 3 results) No results for input(s): PROBNP in the last 8760 hours. HbA1C: No results for input(s): HGBA1C in the last 72 hours. CBG: No results for input(s): GLUCAP in the last 168 hours. Lipid Profile: No results for input(s): CHOL, HDL, LDLCALC, TRIG, CHOLHDL, LDLDIRECT in the last 72 hours. Thyroid Function Tests: No results for input(s): TSH, T4TOTAL, FREET4, T3FREE, THYROIDAB in the last 72 hours. Anemia Panel: No results for input(s): VITAMINB12, FOLATE, FERRITIN, TIBC, IRON, RETICCTPCT in the last 72 hours. Urine analysis:    Component Value Date/Time   COLORURINE AMBER (A) 10/08/2021 2222   APPEARANCEUR CLOUDY (A) 10/08/2021 2222   LABSPEC 1.018 10/08/2021 2222   PHURINE 8.0 10/08/2021 2222   GLUCOSEU NEGATIVE 10/08/2021 2222   HGBUR NEGATIVE 10/08/2021 2222   BILIRUBINUR SMALL (A) 10/08/2021 2222   KETONESUR NEGATIVE 10/08/2021 2222   PROTEINUR 100 (A) 10/08/2021 2222   UROBILINOGEN 0.2 06/18/2015 0441   NITRITE NEGATIVE 10/08/2021 2222   LEUKOCYTESUR LARGE (A) 10/08/2021 2222   Sepsis Labs: @LABRCNTIP (procalcitonin:4,lacticidven:4) ) Recent Results (from the past 240 hour(s))  Resp Panel by RT-PCR (Flu A&B, Covid) Nasopharyngeal Swab     Status: None   Collection Time: 10/08/21 10:02 PM   Specimen: Nasopharyngeal Swab; Nasopharyngeal(NP) swabs in vial transport medium  Result Value Ref Range Status   SARS  Coronavirus 2 by RT PCR NEGATIVE NEGATIVE Final    Comment: (NOTE) SARS-CoV-2 target nucleic acids are NOT DETECTED.  The SARS-CoV-2 RNA is generally detectable  in upper respiratory specimens during the acute phase of infection. The lowest concentration of SARS-CoV-2 viral copies this assay can detect is 138 copies/mL. A negative result does not preclude SARS-Cov-2 infection and should not be used as the sole basis for treatment or other patient management decisions. A negative result may occur with  improper specimen collection/handling, submission of specimen other than nasopharyngeal swab, presence of viral mutation(s) within the areas targeted by this assay, and inadequate number of viral copies(<138 copies/mL). A negative result must be combined with clinical observations, patient history, and epidemiological information. The expected result is Negative.  Fact Sheet for Patients:  EntrepreneurPulse.com.au  Fact Sheet for Healthcare Providers:  IncredibleEmployment.be  This test is no t yet approved or cleared by the [place] FDA and  has been authorized for detection and/or diagnosis of SARS-CoV-2 by FDA under an Emergency Use Authorization (EUA). This EUA will remain  in effect (meaning this test can be used) for the duration of the COVID-19 declaration under Section 564(b)(1) of the Act, 21 U.S.C.section 360bbb-3(b)(1), unless the authorization is terminated  or revoked sooner.       Influenza A by PCR NEGATIVE NEGATIVE Final   Influenza B by PCR NEGATIVE NEGATIVE Final    Comment: (NOTE) The Xpert Xpress SARS-CoV-2/FLU/RSV plus assay is intended as an aid in the diagnosis of influenza from Nasopharyngeal swab specimens and should not be used as a sole basis for treatment. Nasal washings and aspirates are unacceptable for Xpert Xpress SARS-CoV-2/FLU/RSV testing.  Fact Sheet for  Patients: EntrepreneurPulse.com.au  Fact Sheet for Healthcare Providers: IncredibleEmployment.be  This test is not yet approved or cleared by the [place] FDA and has been authorized for detection and/or diagnosis of SARS-CoV-2 by FDA under an Emergency Use Authorization (EUA). This EUA will remain in effect (meaning this test can be used) for the duration of the COVID-19 declaration under Section 564(b)(1) of the Act, 21 U.S.C. section 360bbb-3(b)(1), unless the authorization is terminated or revoked.  Performed at [facility], [place] [address], [place], [place] [address]      Radiological Exams on Admission: DG Chest Port 1 View  Result Date: 10/08/2021 CLINICAL DATA:  Tremors, questionable sepsis EXAM: PORTABLE CHEST 1 VIEW COMPARISON:  07/02/2021 FINDINGS: Frontal view of the chest demonstrates stable pacer. Cardiac silhouette is unremarkable. Continued ectasia of the thoracic aorta. Linear consolidation at the left lung base may reflect scarring or atelectasis. No acute airspace disease, effusion, or pneumothorax. No acute bony abnormalities. IMPRESSION: 1. Linear consolidation left lung base likely representing atelectasis or scarring. 2. Otherwise unremarkable exam. Electronically Signed   By: Randa Ngo M.D.   On: 10/08/2021 22:28    EKG: Independently reviewed. SR, PVC, LBBB.   Assessment/Plan   1. Suspected sepsis d/t UTI; hx of ESBL infections  - Pt with chronic Foley and recurrent ESBL UTIs presents with rigors and hypotension concerning for bacteremia from urinary source  - In the ED, blood and urine cultures collected in ED and he was treated with 4 L NS bolus and 1 g IV Rocephin  - Change to meropenem, continue fluid-resuscitation, follow cultures and clinical course     2. Chronic diastolic CHF  - EF was low normal on TTE in January 2022  - Appears compensated  - Underwent fluid-resuscitation with 4  liters saline in ED  - Hold Lasix in light of hypotension and concern for evolving sepsis, follow weights and I/Os     3. Paroxysmal atrial fibrillation  -  In SR in ED  - CHADS-VASc at least 5 (CVA x2, age x2, CHF), continue warfarin   4. Hx of CVA  - Continue warfarin    5. CKD IIIb  - SCr is 1.51 on admission, similar to priors  - Renally-dose medications, monitor   6. Thrombocytopenia   - Appears to be chronic and stable    DVT prophylaxis: Warfarin  Code Status: DNR. Discussed with family at time of admission. Pt has been adamant about no CPR or extraordinary measures but family think he'd be okay with pressers via PIV briefly if needed but not central line.  Level of Care: Level of care: Stepdown Family Communication: Son and daugther-in-law updated by phone  Disposition Plan:  Patient is from: Home  Anticipated d/c is to: TBD Anticipated d/c date is: 10/12/21 Patient currently: Pending cultures, improved/stable blood pressure Consults called: none  Admission status: Inpatient     Vianne Bulls, MD [facility]  10/09/2021, 5:37 AM

## 2021-10-09 NOTE — Progress Notes (Signed)
Pharmacy Antibiotic Note  Douglas Walker is a 87 y.o. male admitted on 10/08/2021 with ESBL infection.  Pharmacy has been consulted for Meropenem dosing.  Plan: Meropenem 1gm IV q12h Follow renal function F/u culture results and sensitivities  Height: 5' 9.5" (176.5 cm) Weight: 93 kg (205 lb) IBW/kg (Calculated) : 71.85  Temp (24hrs), Avg:100.7 F (38.2 C), Min:98.7 F (37.1 C), Max:102.6 F (39.2 C)  Recent Labs  Lab 10/08/21 2202 10/09/21 0046  WBC 9.7  --   CREATININE 1.51*  --   LATICACIDVEN 2.1* 1.6    Estimated Creatinine Clearance: 34 mL/min (A) (by C-G formula based on SCr of 1.51 mg/dL (H)).    Allergies  Allergen Reactions   Sulfa Antibiotics Other (See Comments)    Weakness, dizziness   Primidone     Other reaction(s): increased sedation   Simvastatin     Other reaction(s): arthralgias and fatigue, severe    Antimicrobials this admission: 10/11 Ceftriaxone x 1 10/11 Meropenem >>    Dose adjustments this admission:    Microbiology results: 10/10 BCx:   10/10 UCx:      Thank you for allowing pharmacy to be a part of this patient's care.  Everette Rank, PharmD 10/09/2021 5:14 AM

## 2021-10-09 NOTE — Progress Notes (Signed)
Patient seen and examined. Admitted after midnight with concerns for sever sepsis due to UTI. Patient met criteria for severe sepsis on admission with fever, low BP, elevated RR, lactic acid of 2.1 and AMS; source of infection suspected to be Urine (UTI) and patient had prior hx of ESBL. Please refer to H&P written by Dr. Myna Hidalgo on 10/09/21 for further info/details on admission.  Plan: -continue to follow culture results and clinical course. -continue to maintain adequate hydration -continue current antibiotics (Meropenem) and narrow base on culture results. -continue to follow INR and rec's by pharmacy on patient's anticoagulation. -patient remains at high risk for decompensation and after discussing with family code status has been confirmed.   Barton Dubois MD [phone]

## 2021-10-09 NOTE — Plan of Care (Signed)
Discussed with patient plan of care for the evening, pain management and use of a bedpan versus bedside commode with some teach back displayed.  Patient unable to have a bowel movement at the time will try bedside commode to have one in the morning when less week.  Problem: Respiratory: Goal: Ability to maintain adequate ventilation will improve Outcome: Progressing

## 2021-10-09 NOTE — ED Provider Notes (Signed)
[facility] DEPT Provider Note  CSN: [id number] Arrival date & time: 10/08/21 2136  Chief Complaint(s) Chills and Tremors  HPI Douglas Walker is a 87 y.o. male with a past medical history listed below who presents to the emergency department for fever chills and rigors.  Patient had COVID booster 3 days ago.  Was doing well up until earlier today when he noted patient was a bit more confused.  He had been up and about throughout the day.  In the evening patient called out to family who noted he was shaking.  They checked his temperature and it was in the low 100s.  They deny any recent coughs or congestion.  No emesis.  No diarrhea.  Patient has not complained of any abdominal pain.   Patient has a history of indwelling catheter due to recurrent urinary tract infections currently on prophylactic Macrobid.  He was recently treated for UTI last month.  Family reports that the patient's catheter was replaced 1 week ago.  HPI  Past Medical History Past Medical History:  Diagnosis Date   Arthritis    shoulders and back   Cancer ([place])    skin cancers   Chronic low back pain 12/15/2017   CKD (chronic kidney disease) stage 3, GFR 30-59 ml/min (HCC)    Coronary artery disease    Dysrhythmia     Paroxysmal atrial fibrillation; Tachycardia-bradycardia syndrome   Essential tremor 02/28/2016   GERD (gastroesophageal reflux disease)    Hernia    History of hiatal hernia    Hyperlipemia    Hypertension    Macular degeneration    Neuromuscular disorder (HCC)    neuropathy   Paroxysmal atrial fibrillation ([place]) 10/28/2013   Presence of permanent cardiac pacemaker 12/04/2018   Stroke ([place]) 12/2020   Tachycardia-bradycardia syndrome ([place]) 09/13/2014   TIA (transient ischemic attack)    Patient Active Problem List   Diagnosis Date Noted   AKI (acute kidney injury) ([place]) 07/06/2021   UTI (urinary tract infection) 07/02/2021   Pain in both testicles    Right  hemiparesis ([place]) 04/23/2021   Cerebrovascular accident (CVA) due to embolism of left middle cerebral artery ([place]) 04/23/2021   Neuropathy 04/23/2021   Abnormality of gait 04/23/2021   Abnormal results of thyroid function studies 04/04/2021   Acquired iron deficiency anemia due to decreased absorption 04/04/2021   Acquired thrombophilia ([place]) 04/04/2021   Cerebral atherosclerosis 04/04/2021   Basal cell carcinoma of nose 04/04/2021   Benign hypertensive heart and renal disease, with heart and renal failure ([place]) 04/04/2021   Benign prostatic hyperplasia 04/04/2021   Obesity 04/04/2021   Bradycardia 04/04/2021   Closed fracture of one rib 04/04/2021   Constipation by delayed colonic transit 04/04/2021   Fatigue 04/04/2021   Gastro-esophageal reflux disease without esophagitis 04/04/2021   Hypothyroidism 04/04/2021   Left lower quadrant pain 04/04/2021   Lumbar radiculopathy 04/04/2021   Reactive depression 04/04/2021   Malignant neoplasm of anterior wall of urinary bladder ([place]) 04/04/2021   Mass of chest wall 04/04/2021   Mixed hyperlipidemia 04/04/2021   Nocturia 04/04/2021   Other shoulder lesions, right shoulder 04/04/2021   Pain in limb 04/04/2021   Peripheral edema 04/04/2021   Personal history of malignant neoplasm of bladder 04/04/2021   Pruritus of genitalia 04/04/2021   Tremor 04/04/2021   Unilateral primary osteoarthritis, left knee 04/04/2021   Unspecified mononeuropathy of unspecified lower limb 04/04/2021   Urinary incontinence 04/04/2021   Dysphagia, post-stroke    Chronic diastolic  congestive heart failure (HCC)    Chronic pain syndrome    Left middle cerebral artery stroke ([place]) 02/01/2021   Acute CVA (cerebrovascular accident) ([place]) 01/28/2021   Pacemaker 08/30/2020   Hematoma, chest wall 07/24/2020   Exudative age-related macular degeneration of right eye with inactive choroidal neovascularization ([place]) 05/10/2020   Exudative age-related macular  degeneration of left eye with inactive choroidal neovascularization ([place]) 05/10/2020   Retinal hemorrhage of right eye 05/10/2020   Exposure keratopathy, bilateral 05/10/2020   Bilateral dry eyes 05/10/2020   Advanced nonexudative age-related macular degeneration of both eyes with subfoveal involvement 05/10/2020   Sick sinus syndrome ([place]) 12/06/2018   Complete heart block (HCC) 12/04/2018   Chronic low back pain 12/15/2017   Hereditary and idiopathic peripheral neuropathy 07/01/2016   Essential tremor 02/28/2016   BPPV (benign paroxysmal positional vertigo) 06/19/2015   Dizziness 06/18/2015   Bacteria in urine 06/18/2015   CKD (chronic kidney disease) stage 3, GFR 30-59 ml/min: Probable 06/18/2015   Chronic diastolic CHF (congestive heart failure) ([place]) 05/30/2015   Fall    Syncope 05/19/2015   Lower urinary tract infectious disease 05/19/2015   Hypotension 05/19/2015   Dehydration 05/19/2015   Fracture of rib of left side 05/19/2015   Syncope and collapse 05/19/2015   Mobitz type II atrioventricular block 09/13/2014   Essential hypertension 09/13/2014   Chronic anticoagulation 09/13/2014   Tachycardia-bradycardia syndrome ([place]) 09/13/2014   Paroxysmal atrial fibrillation ([place]) 10/28/2013   Thrombocytopenia ([place]) 04/06/2013   Home Medication(s) Prior to Admission medications   Medication Sig Start Date End Date Taking? Authorizing Provider  acetaminophen (TYLENOL) 325 MG tablet Take 2 tablets (650 mg total) by mouth every 4 (four) hours as needed for mild pain (or temp > 37.5 C (99.5 F)). 02/14/21   Angiulli, Lavon Paganini, PA-C  cephALEXin (KEFLEX) 500 MG capsule Take 1 capsule (500 mg total) by mouth 4 (four) times daily. 09/15/21   Milton Ferguson, MD  DULoxetine (CYMBALTA) 60 MG capsule Take 1 capsule (60 mg total) by mouth daily. 04/23/21   Lovorn, Jinny Blossom, MD  finasteride (PROSCAR) 5 MG tablet Take 1 tablet (5 mg total) by mouth daily. 02/14/21   Angiulli, Lavon Paganini, PA-C  furosemide  (LASIX) 20 MG tablet Take 2 tablets (40 mg total) by mouth daily. Patient taking differently: Take 60 mg by mouth daily. 02/14/21   Angiulli, Lavon Paganini, PA-C  levothyroxine (SYNTHROID) 25 MCG tablet Take 1 tablet (25 mcg total) by mouth daily before breakfast. 02/14/21   Angiulli, Lavon Paganini, PA-C  loratadine (CLARITIN) 10 MG tablet Take 10 mg by mouth daily.    [provider]  Multiple Vitamin (MULTIVITAMIN WITH MINERALS) TABS tablet Take 1 tablet by mouth daily.    [provider]  Multiple Vitamins-Minerals (OCUVITE ADULT 50+) CAPS Take 1 capsule by mouth daily.    [provider]  nitrofurantoin, macrocrystal-monohydrate, (MACROBID) 100 MG capsule Take 100 mg by mouth daily. 06/07/21   [provider]  polyethylene glycol (MIRALAX / GLYCOLAX) 17 g packet Take 17 g by mouth daily. 07/08/21   Mariel Aloe, MD  pregabalin (LYRICA) 25 MG capsule TAKE 1 CAPSULE(25 MG) BY MOUTH AT BEDTIME 02/14/21   Angiulli, Lavon Paganini, PA-C  primidone (MYSOLINE) 250 MG tablet Take 1 tablet (250 mg total) by mouth 2 (two) times daily. 02/14/21   Angiulli, Lavon Paganini, PA-C  senna (SENOKOT) 8.6 MG tablet Take 1 tablet by mouth daily as needed for constipation.    [provider]  tamsulosin (  FLOMAX) 0.4 MG CAPS capsule Take 1 capsule (0.4 mg total) by mouth 2 (two) times daily. 02/14/21   Angiulli, Lavon Paganini, PA-C  topiramate (TOPAMAX) 50 MG tablet Take 1 tablet (50 mg total) by mouth 2 (two) times daily. 02/14/21   Angiulli, Lavon Paganini, PA-C  traMADol (ULTRAM) 50 MG tablet Take 1 tablet (50 mg total) by mouth every 6 (six) hours as needed for severe pain (if tylenol not effective). 02/14/21   Angiulli, Lavon Paganini, PA-C  vitamin B-12 (CYANOCOBALAMIN) 1000 MCG tablet Take 1 tablet (1,000 mcg total) by mouth every other day. 02/14/21   Angiulli, Lavon Paganini, PA-C  warfarin (COUMADIN) 5 MG tablet Take 1 to 1.5 tablets daily as directed by Coumadin clinic 08/14/21   Belva Crome, MD                                                                                                                                     Past Surgical History Past Surgical History:  Procedure Laterality Date   APPENDECTOMY     EYE SURGERY     HERNIA REPAIR     HERNIA REPAIR     INSERT / REPLACE / REMOVE PACEMAKER  12/04/2018   IRRIGATION AND DEBRIDEMENT ABSCESS Right 07/24/2020   Procedure: IRRIGATION AND DEBRIDEMENT HEMATOMA;  Surgeon: Coralie Keens, MD;  Location: [facility];  Service: General;  Laterality: Right;   MASS EXCISION Right 07/20/2020   Procedure: EXCISION OF RIGHT CHEST WALL MASS;  Surgeon: Stark Klein, MD;  Location: [facility];  Service: General;  Laterality: Right;   PACEMAKER IMPLANT N/A 12/04/2018   Procedure: PACEMAKER IMPLANT;  Surgeon: Evans Lance, MD;  Location: [facility];  Service: Cardiovascular;  Laterality: N/A;   Family History Family History  Problem Relation Age of Onset   GI problems Mother    Other Sister        PAIN ISSUES   Hearing loss Sister    Blindness Sister    Heart attack Neg Hx    Stroke Neg Hx     Social History Social History   Tobacco Use   Smoking status: Former    Types: Cigarettes    Quit date: 12/30/1962    Years since quitting: 58.8   Smokeless tobacco: Never  Vaping Use   Vaping Use: Never used  Substance Use Topics   Alcohol use: No   Drug use: No   Allergies Sulfa antibiotics, Primidone, and Simvastatin  Review of Systems Review of Systems All other systems are reviewed and are negative for acute change except as noted in the HPI  Physical Exam Vital Signs  I have reviewed the triage vital signs BP (!) 98/51   Pulse 70   Temp 98.7 F (37.1 C) (Oral)   Resp 16   Ht 5' 9.5" (1.765 m)   Wt 93 kg   SpO2 93%   BMI 29.84 kg/m  Physical Exam Vitals reviewed.  Constitutional:      General: He is not in acute distress.    Appearance: He is well-developed. He is not diaphoretic.  HENT:     Head: Normocephalic and  atraumatic.     Nose: Nose normal.  Eyes:     General: No scleral icterus.       Right eye: No discharge.        Left eye: No discharge.     Conjunctiva/sclera: Conjunctivae normal.     Pupils: Pupils are equal, round, and reactive to light.  Cardiovascular:     Rate and Rhythm: Normal rate and regular rhythm.     Heart sounds: No murmur heard.   No friction rub. No gallop.  Pulmonary:     Effort: Pulmonary effort is normal. No respiratory distress.     Breath sounds: Normal breath sounds. No stridor. No rales.  Abdominal:     General: There is no distension.     Palpations: Abdomen is soft.     Tenderness: There is no abdominal tenderness.  Genitourinary:    Comments: Indwelling foley Musculoskeletal:        General: No tenderness.     Cervical back: Normal range of motion and neck supple.  Skin:    General: Skin is warm and dry.     Findings: No erythema or rash.  Neurological:     Mental Status: He is alert.     Motor: Tremor (essential (baseline)) present.    ED Results and Treatments Labs (all labs ordered are listed, but only abnormal results are displayed) Labs Reviewed  LACTIC ACID, PLASMA - Abnormal; Notable for the following components:      Result Value   Lactic Acid, Venous 2.1 (*)    All other components within normal limits  COMPREHENSIVE METABOLIC PANEL - Abnormal; Notable for the following components:   BUN 33 (*)    Creatinine, Ser 1.51 (*)    GFR, Estimated 43 (*)    All other components within normal limits  CBC WITH DIFFERENTIAL/PLATELET - Abnormal; Notable for the following components:   RBC 3.65 (*)    Hemoglobin 12.1 (*)    HCT 37.0 (*)    MCV 101.4 (*)    Platelets 107 (*)    Neutro Abs 8.8 (*)    Lymphs Abs 0.3 (*)    All other components within normal limits  PROTIME-INR - Abnormal; Notable for the following components:   Prothrombin Time 25.1 (*)    INR 2.3 (*)    All other components within normal limits  APTT - Abnormal; Notable  for the following components:   aPTT 39 (*)    All other components within normal limits  URINALYSIS, ROUTINE W REFLEX MICROSCOPIC - Abnormal; Notable for the following components:   Color, Urine AMBER (*)    APPearance CLOUDY (*)    Bilirubin Urine SMALL (*)    Protein, ur 100 (*)    Leukocytes,Ua LARGE (*)    Bacteria, UA RARE (*)    All other components within normal limits  RESP PANEL BY RT-PCR (FLU A&B, COVID) ARPGX2  CULTURE, BLOOD (SINGLE)  URINE CULTURE  LACTIC ACID, PLASMA  EKG  EKG Interpretation  Date/Time:  Monday October 08 2021 22:01:41 EDT Ventricular Rate:  70 PR Interval:    QRS Duration: 121 QT Interval:  387 QTC Calculation: 418 R Axis:   -28 Text Interpretation: Sinus rhythm Ventricular premature complex Left bundle branch block Confirmed by Addison Lank [phone]) on 10/09/2021 4:59:01 AM       Radiology DG Chest Port 1 View  Result Date: 10/08/2021 CLINICAL DATA:  Tremors, questionable sepsis EXAM: PORTABLE CHEST 1 VIEW COMPARISON:  07/02/2021 FINDINGS: Frontal view of the chest demonstrates stable pacer. Cardiac silhouette is unremarkable. Continued ectasia of the thoracic aorta. Linear consolidation at the left lung base may reflect scarring or atelectasis. No acute airspace disease, effusion, or pneumothorax. No acute bony abnormalities. IMPRESSION: 1. Linear consolidation left lung base likely representing atelectasis or scarring. 2. Otherwise unremarkable exam. Electronically Signed   By: Randa Ngo M.D.   On: 10/08/2021 22:28    Pertinent labs & imaging results that were available during my care of the patient were reviewed by me and considered in my medical decision making (see MDM for details).  Medications Ordered in ED Medications  sodium chloride 0.9 % bolus 1,000 mL (0 mLs Intravenous Stopped 10/09/21 0409)    Followed by   0.9 %  sodium chloride infusion (1,000 mLs Intravenous New Bag/Given (Non-Interop) 10/09/21 0230)  acetaminophen (TYLENOL) tablet 1,000 mg (1,000 mg Oral Given 10/09/21 0011)  cefTRIAXone (ROCEPHIN) 1 g in sodium chloride 0.9 % 100 mL IVPB (0 g Intravenous Stopped 10/09/21 0325)  sodium chloride 0.9 % bolus 1,000 mL (0 mLs Intravenous Stopped 10/09/21 0414)  sodium chloride 0.9 % bolus 1,000 mL (0 mLs Intravenous Stopped 10/09/21 0414)  sodium chloride 0.9 % bolus 1,000 mL (1,000 mLs Intravenous New Bag/Given 10/09/21 0447)                                                                                                                                     Procedures .1-3 Lead EKG Interpretation Performed by: Fatima Blank, MD Authorized by: Fatima Blank, MD     Interpretation: normal     ECG rate:  87   ECG rate assessment: normal     Rhythm: sinus rhythm     Ectopy: none     Conduction: normal   .Critical Care Performed by: Fatima Blank, MD Authorized by: Fatima Blank, MD   Critical care provider statement:    Critical care time (minutes):  78   Critical care was necessary to treat or prevent imminent or life-threatening deterioration of the following conditions:  Circulatory failure   Critical care was time spent personally by me on the following activities:  Development of treatment plan with patient or surrogate, evaluation of patient's response to treatment, examination of patient, obtaining history from patient or surrogate, review of old charts, re-evaluation of patient's condition, pulse oximetry, ordering and review of radiographic studies,  ordering and review of laboratory studies and ordering and performing treatments and interventions   Care discussed with: admitting provider    (including critical care time)  Medical Decision Making / ED Course I have reviewed the nursing notes for this encounter and the patient's prior records (if  available in EHR or on provided paperwork).  CORLEY MAFFEO was evaluated in Emergency Department on 10/09/2021 for the symptoms described in the history of present illness. He was evaluated in the context of the global COVID-19 pandemic, which necessitated consideration that the patient might be at risk for infection with the SARS-CoV-2 virus that causes COVID-19. Institutional protocols and algorithms that pertain to the evaluation of patients at risk for COVID-19 are in a state of rapid change based on information released by regulatory bodies including the CDC and federal and state organizations. These policies and algorithms were followed during the patient's care in the ED.     Patient noted to be febrile and tachycardic. Also noted to be hypoxic on room air. Placed on supplemental oxygen.  Fever may be secondary to recent vaccine. However given the hypoxia and his history of recurrent urinary tract infections, work-up was expanded. Infectious work-up obtain.  Pertinent labs & imaging results that were available during my care of the patient were reviewed by me and considered in my medical decision making:  Chest x-ray without evidence of pneumonia.Marland Kitchen UA suspicious for urinary tract infection but it looks better than his previous samples.  We will send for culture. CBC without leukocytosis. Metabolic panel without significant electrolyte derangements.  Renal function stable and at his baseline. Patient's initial lactic acid was mildly elevated but cleared on its own.  Patient defervesced after Tylenol. During his encounter patient's blood pressure noted to be downtrending. He was started on IV fluids and empiric antibiotics. Patient has received 3 L of IV fluids.  Blood pressure response to IV fluids but downtrends while off.  He will require additional hydration and management. Plan to admit.   Final Clinical Impression(s) / ED Diagnoses Final diagnoses:  Other specified  hypotension  Fever in other diseases     This chart was dictated using voice recognition software.  Despite best efforts to proofread,  errors can occur which can change the documentation meaning.    Fatima Blank, MD 10/09/21 [phone]

## 2021-10-09 NOTE — Progress Notes (Signed)
ANTICOAGULATION CONSULT NOTE - follow up  Pharmacy Consult for Warfarin Indication: atrial fibrillation  Allergies  Allergen Reactions   Sulfa Antibiotics Other (See Comments)    Weakness, dizziness   Primidone     Other reaction(s): increased sedation   Simvastatin     Other reaction(s): arthralgias and fatigue, severe    Patient Measurements: Height: 5' 9.5" (176.5 cm) Weight: 93 kg (205 lb) IBW/kg (Calculated) : 71.85   Vital Signs: Temp: 98.7 F (37.1 C) (10/11 0252) Temp Source: Oral (10/11 0252) BP: 113/57 (10/11 0900) Pulse Rate: 70 (10/11 0900)  Labs: Recent Labs    10/08/21 2202 10/09/21 0537  HGB 12.1* 10.1*  HCT 37.0* 31.2*  PLT 107* 82*  APTT 39*  --   LABPROT 25.1*  --   INR 2.3*  --   CREATININE 1.51* 1.77*     Estimated Creatinine Clearance: 29 mL/min (A) (by C-G formula based on SCr of 1.77 mg/dL (H)).   Medical History: Past Medical History:  Diagnosis Date   Arthritis    shoulders and back   Cancer ([place])    skin cancers   Chronic low back pain 12/15/2017   CKD (chronic kidney disease) stage 3, GFR 30-59 ml/min (HCC)    Coronary artery disease    Dysrhythmia     Paroxysmal atrial fibrillation; Tachycardia-bradycardia syndrome   Essential tremor 02/28/2016   GERD (gastroesophageal reflux disease)    Hernia    History of hiatal hernia    Hyperlipemia    Hypertension    Macular degeneration    Neuromuscular disorder (HCC)    neuropathy   Paroxysmal atrial fibrillation ([place]) 10/28/2013   Presence of permanent cardiac pacemaker 12/04/2018   Stroke ([place]) 12/2020   Tachycardia-bradycardia syndrome ([place]) 09/13/2014   TIA (transient ischemic attack)     Medications:  PTA Warfarin  Assessment: 87 yr male admitted with suspected sepsis secondary to UTI with a history of ESBL infections.  PMH significant for chronic dCHF, PAF, CVS, CKD and thrombocytopenia Patient on warfarin PTA for Afib at a reported dose of 7.5mg  daily except 10mg  on  Fridays, last dose PTA reported as being taken 10/10 at 1830  10/09/21 INR therapeutic PM 10/10 CBC - Hgb 10.1 and Plts 82 both dropped - monitor closely No reported bleeding  Goal of Therapy:  INR 2-3    Plan:  Warfarin dose per home regimen - 7.5mg  today at 1600 Daily PT/INR   Adrian Saran, PharmD, BCPS Secure Chat if ?s 10/09/2021 10:25 AM ml

## 2021-10-10 LAB — URINE CULTURE: Culture: 90000 — AB

## 2021-10-10 LAB — CBC
HCT: 31.9 % — ABNORMAL LOW (ref 39.0–52.0)
Hemoglobin: 10.5 g/dL — ABNORMAL LOW (ref 13.0–17.0)
MCH: 32.9 pg (ref 26.0–34.0)
MCHC: 32.9 g/dL (ref 30.0–36.0)
MCV: 100 fL (ref 80.0–100.0)
Platelets: 80 10*3/uL — ABNORMAL LOW (ref 150–400)
RBC: 3.19 MIL/uL — ABNORMAL LOW (ref 4.22–5.81)
RDW: 15.4 % (ref 11.5–15.5)
WBC: 13.2 10*3/uL — ABNORMAL HIGH (ref 4.0–10.5)
nRBC: 0 % (ref 0.0–0.2)

## 2021-10-10 LAB — BASIC METABOLIC PANEL
Anion gap: 7 (ref 5–15)
BUN: 34 mg/dL — ABNORMAL HIGH (ref 8–23)
CO2: 23 mmol/L (ref 22–32)
Calcium: 8.6 mg/dL — ABNORMAL LOW (ref 8.9–10.3)
Chloride: 108 mmol/L (ref 98–111)
Creatinine, Ser: 1.76 mg/dL — ABNORMAL HIGH (ref 0.61–1.24)
GFR, Estimated: 35 mL/min — ABNORMAL LOW (ref >60)
Glucose, Bld: 111 mg/dL — ABNORMAL HIGH (ref 70–99)
Potassium: 3.8 mmol/L (ref 3.5–5.1)
Sodium: 138 mmol/L (ref 135–145)

## 2021-10-10 LAB — BLOOD CULTURE ID PANEL (REFLEXED) - BCID2

## 2021-10-10 LAB — PROTIME-INR
INR: 3.8 — ABNORMAL HIGH (ref 0.8–1.2)
Prothrombin Time: 37.7 seconds — ABNORMAL HIGH (ref 11.4–15.2)

## 2021-10-10 LAB — PROCALCITONIN: Procalcitonin: 4.21 ng/mL

## 2021-10-10 MED ORDER — LEVOTHYROXINE SODIUM 25 MCG PO TABS
25.0000 ug | ORAL_TABLET | Freq: Every day | ORAL | Status: DC
  Administered 2021-10-11 – 2021-10-15 (×5): 25 ug via ORAL
  Filled 2021-10-10 (×5): qty 1

## 2021-10-10 MED ORDER — SODIUM CHLORIDE 0.9 % IV SOLN
2.0000 g | Freq: Three times a day (TID) | INTRAVENOUS | Status: DC
  Administered 2021-10-10 – 2021-10-11 (×3): 2 g via INTRAVENOUS
  Filled 2021-10-10 (×3): qty 2000

## 2021-10-10 MED ORDER — PREGABALIN 25 MG PO CAPS
25.0000 mg | ORAL_CAPSULE | Freq: Every day | ORAL | Status: DC
  Administered 2021-10-10 – 2021-10-14 (×5): 25 mg via ORAL
  Filled 2021-10-10 (×5): qty 1

## 2021-10-10 MED ORDER — DULOXETINE HCL 30 MG PO CPEP
60.0000 mg | ORAL_CAPSULE | Freq: Every day | ORAL | Status: DC
  Administered 2021-10-10 – 2021-10-15 (×6): 60 mg via ORAL
  Filled 2021-10-10 (×6): qty 2

## 2021-10-10 MED ORDER — TOPIRAMATE 25 MG PO TABS
50.0000 mg | ORAL_TABLET | Freq: Two times a day (BID) | ORAL | Status: DC
  Administered 2021-10-10 – 2021-10-15 (×10): 50 mg via ORAL
  Filled 2021-10-10 (×10): qty 2

## 2021-10-10 NOTE — Evaluation (Signed)
Occupational Therapy Evaluation Patient Details Name: Douglas Walker MRN: [id number] DOB: Feb 21, 1925 Today's Date: 10/10/2021   History of Present Illness Douglas Walker is a 87 y.o. male with a history of sick sinus syndrome with pacer, paroxysmal atrial fibrillation on warfarin, history of CVA, chronic diastolic CHF, Foley catheter, and recurrent UTI with ESBL. Patient presented secondary to chills and confusion with concern for UTI/sepsi   Clinical Impression   Patient is a 87 year old male who was admitted for above. Patient was noted to have increased confusion with no family available in room to provide PLOF. Patient was noted to have decreased activity tolerance, decreased endurance, decreased sitting balance, and decreased strength impacting participation in ADLs. Patient was noted to have O2 range from 87% to 91% during session with nursing aware. Patient would continue to benefit from skilled OT services at this time while admitted and after d/c to address noted deficits in order to improve overall safety and independence in ADLs.       Recommendations for follow up therapy are one component of a multi-disciplinary discharge planning process, led by the attending physician.  Recommendations may be updated based on patient status, additional functional criteria and insurance authorization.   Follow Up Recommendations  SNF    Equipment Recommendations  None recommended by OT    Recommendations for Other Services       Precautions / Restrictions Precautions Precautions: Fall Restrictions Weight Bearing Restrictions: No      Mobility Bed Mobility Overal bed mobility: Needs Assistance Bed Mobility: Sidelying to Sit;Rolling Rolling: Mod assist Sidelying to sit: Max assist;HOB elevated       General bed mobility comments: patient needed hand held assistance to reach bed rail on side of bed. patietn needed physical assistance for movement of BLE to edge of bed and  transition into upright sitting.    Transfers Overall transfer level: Needs assistance   Transfers: Sit to/from Stand;Stand Pivot Transfers Sit to Stand: +2 safety/equipment;+2 physical assistance;Max assist Stand pivot transfers: +2 physical assistance;+2 safety/equipment;Max assist       General transfer comment: Patient required max assist of 2 to stnad from bed, heavily listing to  right, supported by theropaist. Patient began to take a  step, improved stature once starting to take a  sep, Able  to step to recliner, multimodal cues due to [facility].    Balance Overall balance assessment: Needs assistance Sitting-balance support: Feet supported;Bilateral upper extremity supported Sitting balance-Leahy Scale: Poor   Postural control: Right lateral lean Standing balance support: During functional activity;Bilateral upper extremity supported Standing balance-Leahy Scale: Poor Standing balance comment: support to stnad, listing to right                           ADL either performed or assessed with clinical judgement   ADL Overall ADL's : Needs assistance/impaired Eating/Feeding: Bed level;Minimal assistance Eating/Feeding Details (indicate cue type and reason): patient noted to have resting tremor in UE Grooming: Wash/dry face;Wash/dry hands;Bed level;Set up   Upper Body Bathing: Moderate assistance;Bed level   Lower Body Bathing: Bed level;Maximal assistance   Upper Body Dressing : Bed level;Moderate assistance   Lower Body Dressing: Bed level;Maximal assistance   Toilet Transfer: +2 for physical assistance;Total assistance;+2 for safety/equipment Toilet Transfer Details (indicate cue type and reason): defered on this date. Toileting- Clothing Manipulation and Hygiene: Total assistance;Bed level       Functional mobility during ADLs: +2 for safety/equipment;+2 for physical  assistance       Vision Baseline Vision/History: 1 Wears glasses Patient Visual  Report: No change from baseline ("you know i have had a stroke") Additional Comments: patient was noted to keep L eye closed during session with glasses in place.     Perception     Praxis      Pertinent Vitals/Pain Pain Assessment: No/denies pain     Hand Dominance Right   Extremity/Trunk Assessment Upper Extremity Assessment Upper Extremity Assessment: Generalized weakness (patient reported bilateral shoulder are sore from helping roll in bed.)   Lower Extremity Assessment Lower Extremity Assessment: Defer to PT evaluation   Cervical / Trunk Assessment Cervical / Trunk Assessment: Other exceptions Cervical / Trunk Exceptions: leans to right.   Communication Communication Communication: HOH   Cognition Arousal/Alertness: Awake/alert Behavior During Therapy: Restless;Anxious;Impulsive Overall Cognitive Status: Impaired/Different from baseline Area of Impairment: Orientation                 Orientation Level: Situation             General Comments: patient is able to follow simple commands when spoken loud. patient is very [place] Comments       Exercises     Shoulder Instructions      Home Living Family/patient expects to be discharged to:: Private residence Living Arrangements: Children;Other relatives Available Help at Discharge: Family;Available 24 hours/day Type of Home: House Home Access: Level entry     Home Layout: One level     Bathroom Shower/Tub: Occupational psychologist: Handicapped height Bathroom Accessibility: Yes How Accessible: Accessible via walker Home Equipment: Shower seat;Grab bars - toilet;Walker - 2 wheels;Cane - single point;Bedside commode;Grab bars - tub/shower;Hand held shower head;Wheelchair - manual   Additional Comments: lives with son and DIL,  home information taken from previous encounter, patient not able to provide.      Prior Functioning/Environment Level of Independence: Independent with  assistive device(s)        Comments: unsure if still mod I with RW, no family present patient reports being able to complete shower independently. patient does not appear to be a reliable sourse of info at this time.        OT Problem List: Decreased strength;Decreased activity tolerance;Impaired balance (sitting and/or standing);Decreased safety awareness;Impaired UE functional use;Decreased knowledge of use of DME or AE      OT Treatment/Interventions: Self-care/ADL training;DME and/or AE instruction;Energy conservation;Balance training;Patient/family education    OT Goals(Current goals can be found in the care plan section) Acute Rehab OT Goals Patient Stated Goal: none stated OT Goal Formulation: With patient Time For Goal Achievement: 10/24/21 Potential to Achieve Goals: Fair  OT Frequency: Min 2X/week   Barriers to D/C:    patient indicated completing tasks MI at home with RW. unclear of physical assist available       Co-evaluation              AM-PAC OT "6 Clicks" Daily Activity     Outcome Measure Help from another person eating meals?: A Little Help from another person taking care of personal grooming?: A Little Help from another person toileting, which includes using toliet, bedpan, or urinal?: Total Help from another person bathing (including washing, rinsing, drying)?: Total Help from another person to put on and taking off regular upper body clothing?: Total Help from another person to put on and taking off regular lower body clothing?: Total 6 Click Score: 10   End of Session  Nurse Communication: Other (comment) (nurse cleared patient to particiapte and was updated after session)  Activity Tolerance: Patient limited by fatigue Patient left: in bed;with call bell/phone within reach;with nursing/sitter in room  OT Visit Diagnosis: Unsteadiness on feet (R26.81);Muscle weakness (generalized) (M62.81)                Time: 9937-1696 OT Time Calculation  (min): 18 min Charges:  OT General Charges $OT Visit: 1 Visit OT Evaluation $OT Eval Moderate Complexity: 1 Mod  Jackelyn Poling OTR/L, MS Acute Rehabilitation Department Office# [phone] Pager# [phone]   [place] 10/10/2021, 3:37 PM

## 2021-10-10 NOTE — Progress Notes (Signed)
Called son bring hearing aid

## 2021-10-10 NOTE — Progress Notes (Signed)
   10/10/21 2040  Assess: MEWS Score  Temp (!) 102.4 F (39.1 C) (Told the nurse Mercy Riding 10/10/21)  BP (!) 154/73 (Told the nurse Mercy Riding 10/10/21)  Pulse Rate 69  Resp (!) 28 (Told the nurse Mercy Riding 10/10/21)  Level of Consciousness Alert  SpO2 (!) 89 % (Told the nurse Mercy Riding 10/10/21)  O2 Device Room Air  Assess: MEWS Score  MEWS Temp 2  MEWS Systolic 0  MEWS Pulse 0  MEWS RR 2  MEWS LOC 0  MEWS Score 4  MEWS Score Color Red  Assess: SIRS CRITERIA  SIRS Temperature  1  SIRS Pulse 0  SIRS Respirations  1  SIRS WBC 0  SIRS Score Sum  2  RED MEWS , tylenol 650po given, placed on O2 at 2lnc, [facility] hospitalist notified, RR nurse informed.

## 2021-10-10 NOTE — Progress Notes (Signed)
   10/10/21 1237  Assess: MEWS Score  Temp (!) 103.1 F (39.5 C)  BP (!) 152/72  Pulse Rate 70  Resp (!) 22  Level of Consciousness Alert  SpO2 (!) 88 %  O2 Device Room Air  Assess: MEWS Score  MEWS Temp 2  MEWS Systolic 0  MEWS Pulse 0  MEWS RR 1  MEWS LOC 0  MEWS Score 3  MEWS Score Color Yellow  Treat  Pain Scale 0-10  Pain Score 0  Complains of Fever  Interventions Medication (see MAR)  Take Vital Signs  Increase Vital Sign Frequency  Yellow: Q 2hr X 2 then Q 4hr X 2, if remains yellow, continue Q 4hrs  Notify: Provider  Provider Name/Title MD Netty  Date Provider Notified 10/10/21  Notification Type  (secure chat)  Notification Reason Change in status;Requested by patient/family  Provider response Evaluate remotely  Assess: SIRS CRITERIA  SIRS Temperature  1  SIRS Pulse 0  SIRS Respirations  1  SIRS WBC 1  SIRS Score Sum  3   Pt transferred from ICU to floor with elevated temp and slight confusion. MD Netty made aware via secure chat and received new orders; also made aware of pt's family response to wanting furosemide restarted. Per MD med still on hold until BP improves. Family made aware of all new med changes and why furosemide not being started. They verbalize understanding and have left room. Pt's temp has decreased with interventions in place. Yellow MEWS protocol in place.

## 2021-10-10 NOTE — Evaluation (Signed)
Physical Therapy Evaluation Patient Details Name: Douglas Walker MRN: [id number] DOB: Aug 19, 1925 Today's Date: 10/10/2021  History of Present Illness  Douglas Walker is a 87 y.o. male with a history of sick sinus syndrome with pacer, paroxysmal atrial fibrillation on warfarin, history of CVA, chronic diastolic CHF, Foley catheter, and recurrent UTI with ESBL. Patient presented secondary to chills and confusion with concern for UTI/sepsi  Clinical Impression  The patient  appears restless in bed. When told he was getting to recliner, stated" I thought I was getting a bath." Then" I always get up on that side. "  Patient  with noted tremors of head and arms.could not hold cup to drink due to tremors.  Patient working hard to get self to bed edge, required max of 2 to fully sit up, heavily listing to right, Assisted to stand with max assistance, still heavily leaning to right being held up by therapist. Patient  started taking steps to  toward recliner with less lean and supporting self on Rw. Able to take a few steps  and turn to sit down. Unsure of prior level of function, if ambulates independently. Patient could not answer, no family present. Pt admitted with above diagnosis.   Pt currently with functional limitations due to the deficits listed below (see PT Problem List). Pt will benefit from skilled PT to increase their independence and safety with mobility to allow discharge to the venue listed below.    HR 70,  91% RA 146/58         Recommendations for follow up therapy are one component of a multi-disciplinary discharge planning process, led by the attending physician.  Recommendations may be updated based on patient status, additional functional criteria and insurance authorization.  Follow Up Recommendations Home health PT;SNF unless family provides 24/7 assistance.    Equipment Recommendations  None recommended by PT    Recommendations for Other Services        Precautions / Restrictions Precautions Precautions: Fall      Mobility  Bed Mobility Overal bed mobility: Needs Assistance Bed Mobility: Sidelying to Sit;Rolling Rolling: Min assist Sidelying to sit: Max assist;+2 for physical assistance;+2 for safety/equipment       General bed mobility comments: patient worked hard to get legs to bed edge after rolling to side with bottom rail up while therapist set up chair.Patient required max assistance to get to sitting, strongly leaning to the right. Transfers Overall transfer level: Needs assistance   Transfers: Sit to/from Stand;Stand Pivot Transfers Sit to Stand: +2 safety/equipment;+2 physical assistance;Max assist Stand pivot transfers: +2 physical assistance;+2 safety/equipment;Max assist       General transfer comment: Patient required max assist of 2 to stnad from bed, heavily listing to  right, supported by theropaist. Patient began to take a  step, improved stature once starting to take a  step, Able  to step to recliner,holding  onto RW, max assistance for support. multimodal cues due to [facility].  Ambulation/Gait                Stairs            Wheelchair Mobility    Modified Rankin (Stroke Patients Only)       Balance Overall balance assessment: Needs assistance Sitting-balance support: Feet supported;Bilateral upper extremity supported Sitting balance-Leahy Scale: Poor   Postural control: Right lateral lean Standing balance support: During functional activity;Bilateral upper extremity supported Standing balance-Leahy Scale: Poor Standing balance comment: support to stnad, listing to right  Pertinent Vitals/Pain Pain Assessment: No/denies pain    Home Living Family/patient expects to be discharged to:: Private residence Living Arrangements: Children;Other relatives Available Help at Discharge: Family;Available 24 hours/day Type of Home: House Home Access:  Level entry     Home Layout: One level Home Equipment: Shower seat;Grab bars - toilet;Walker - 2 wheels;Cane - single point;Bedside commode;Grab bars - tub/shower;Hand held shower head;Wheelchair - manual Additional Comments: lives with son and DIL,  home information taken from previous encounter, patient not able to provide.    Prior Function           Comments: unsure if still mod I with RW, no family present     Hand Dominance        Extremity/Trunk Assessment   Upper Extremity Assessment Upper Extremity Assessment:  (significant tremors both  arms.)    Lower Extremity Assessment Lower Extremity Assessment: Generalized weakness    Cervical / Trunk Assessment Cervical / Trunk Assessment: Other exceptions Cervical / Trunk Exceptions: leans to right.  Communication   Communication: HOH  Cognition Arousal/Alertness: Awake/alert Behavior During Therapy: Restless;Anxious;Impulsive Overall Cognitive Status: Impaired/Different from baseline Area of Impairment: Orientation                 Orientation Level: Situation             General Comments: states at [employer], lives with son and D-I-L, follows simple directions when spoken to  close due to [facility].      General Comments      Exercises     Assessment/Plan    PT Assessment Patient needs continued PT services  PT Problem List Decreased strength;Decreased mobility;Decreased safety awareness;Decreased activity tolerance;Decreased balance;Decreased knowledge of precautions       PT Treatment Interventions DME instruction;Therapeutic activities;Gait training;Therapeutic exercise;Patient/family education;Functional mobility training    PT Goals (Current goals can be found in the Care Plan section)  Acute Rehab PT Goals Patient Stated Goal: difficulty with communication with HOH. PT Goal Formulation: Patient unable to participate in goal setting Time For Goal Achievement: 10/24/21 Potential to Achieve  Goals: Fair    Frequency Min 2X/week   Barriers to discharge        Co-evaluation               AM-PAC PT "6 Clicks" Mobility  Outcome Measure Help needed turning from your back to your side while in a flat bed without using bedrails?: A Lot Help needed moving from lying on your back to sitting on the side of a flat bed without using bedrails?: A Lot Help needed moving to and from a bed to a chair (including a wheelchair)?: Total Help needed standing up from a chair using your arms (e.g., wheelchair or bedside chair)?: Total Help needed to walk in hospital room?: Total Help needed climbing 3-5 steps with a railing? : Total 6 Click Score: 8    End of Session Equipment Utilized During Treatment: Gait belt Activity Tolerance: Patient limited by fatigue Patient left: in chair;with call bell/phone within reach;with chair alarm set Nurse Communication: Mobility status PT Visit Diagnosis: Unsteadiness on feet (R26.81);Difficulty in walking, not elsewhere classified (R26.2)    Time: 1610-9604 PT Time Calculation (min) (ACUTE ONLY): 26 min   Charges:   PT Evaluation $PT Eval Low Complexity: 1 Low PT Treatments $Therapeutic Activity: 8-22 mins        Tresa Endo PT Acute Rehabilitation Services Pager [phone] Office [phone]   Claretha Cooper 10/10/2021, 2:12 PM

## 2021-10-10 NOTE — Progress Notes (Signed)
ANTICOAGULATION CONSULT NOTE - follow up  Pharmacy Consult for Warfarin Indication: atrial fibrillation  Allergies  Allergen Reactions   Sulfa Antibiotics Other (See Comments)    Weakness, dizziness   Primidone     Other reaction(s): increased sedation   Simvastatin     Other reaction(s): arthralgias and fatigue, severe    Patient Measurements: Height: 5' 9.5" (176.5 cm) Weight: 99.5 kg (219 lb 5.7 oz) IBW/kg (Calculated) : 71.85   Vital Signs: Temp: 99.2 F (37.3 C) (10/12 0800) Temp Source: Oral (10/12 0800) BP: 167/57 (10/12 0900) Pulse Rate: 70 (10/12 0900)  Labs: Recent Labs    10/08/21 2202 10/09/21 0537 10/10/21 0252  HGB 12.1* 10.1* 10.5*  HCT 37.0* 31.2* 31.9*  PLT 107* 82* 80*  APTT 39*  --   --   LABPROT 25.1*  --  37.7*  INR 2.3*  --  3.8*  CREATININE 1.51* 1.77* 1.76*     Estimated Creatinine Clearance: 30.1 mL/min (A) (by C-G formula based on SCr of 1.76 mg/dL (H)).   Medical History: Past Medical History:  Diagnosis Date   Arthritis    shoulders and back   Cancer ([place])    skin cancers   Chronic low back pain 12/15/2017   CKD (chronic kidney disease) stage 3, GFR 30-59 ml/min (HCC)    Coronary artery disease    Dysrhythmia     Paroxysmal atrial fibrillation; Tachycardia-bradycardia syndrome   Essential tremor 02/28/2016   GERD (gastroesophageal reflux disease)    Hernia    History of hiatal hernia    Hyperlipemia    Hypertension    Macular degeneration    Neuromuscular disorder (HCC)    neuropathy   Paroxysmal atrial fibrillation ([place]) 10/28/2013   Presence of permanent cardiac pacemaker 12/04/2018   Stroke ([place]) 12/2020   Tachycardia-bradycardia syndrome ([place]) 09/13/2014   TIA (transient ischemic attack)     Medications:  PTA Warfarin  Assessment: 87 yr male admitted with suspected sepsis secondary to UTI with a history of ESBL infections.  PMH significant for chronic dCHF, PAF, CVS, CKD and thrombocytopenia Patient on  warfarin PTA for Afib at a reported dose of 7.5mg  daily except 10mg  on Fridays, last dose PTA reported as being taken 10/10 at 1830  10/10/21 INR now SUPRAtherapeutic CBC ok No reported bleeding Per charting, eating 70-75% meals  Goal of Therapy:  INR 2-3    Plan:  No warfarin dose today Daily PT/INR   Adrian Saran, PharmD, BCPS Secure Chat if ?s 10/10/2021 10:22 AM ml

## 2021-10-10 NOTE — Progress Notes (Signed)
PROGRESS NOTE    Douglas Walker  LFY:[id number] DOB: 1925-10-21 DOA: 10/08/2021 PCP: Josetta Huddle, MD   Brief Narrative: Douglas Walker is a 87 y.o. male with a history of sick sinus syndrome with pacer, paroxysmal atrial fibrillation on warfarin, history of CVA, chronic diastolic CHF, Foley catheter, and recurrent UTI with ESBL. Patient presented secondary to chills and confusion with concern for UTI/sepsis. Empiric antibiotics initiated and cultures obtained with urine culture significant for proteus mirabilis. Blood culture pending.   Assessment & Plan:   Principal Problem:   Sepsis secondary to UTI [facility]) Active Problems:   Thrombocytopenia (HCC)   Paroxysmal atrial fibrillation (HCC)   CKD (chronic kidney disease) stage 3, GFR 30-59 ml/min: Probable   Chronic diastolic congestive heart failure (HCC)   Cerebrovascular accident (CVA) due to embolism of left middle cerebral artery ([place])   Severe sepsis Patient on admission with a Tmax of 39.2 C with associated WBC of 19,600 and lactic acid of 2.1. Secondary to UTI/pyelonephritis. Meropenem IV started empirically for history of ESBL infection. Urine and blood cultures obtained on admission and are significant for proteus mirabilis.  CAUTI/Pyelonephritis Bacteremia Present on admission. Foley catheter exchanged this admission. Cultures significant for proteus mirabilis. No flank pain. No recurrent/persistent fevers. Urine culture sensitivities not consistent with ESBL; blood culture pending. -Continue meropenem pending blood culture sensitivities then likely switch to ampicillin  Paroxysmal atrial fibrillation In sinus rhythm. Patient is on warfarin as an outpatient.  Chronic diastolic heart failure Stable. EF of 50-55% in January 2022. Patient is on Lasix as an outpatient which was held on admission secondary to sepsis -Continue to hold Lasix  History of CVA -Continue Warfarin  AKI on CKD stage IIIb Baseline  creatinine of about 1.3-1.4. Creatinine of 1.51 on admission with worsening up to 1.77. In setting of hypotension. -Trend BMP  Chronic foley catheter BPH Patient follows with Urology. He is on nitrofurantoin, finasteride/tamsulosin (recently discontinued) as an outpatient -Resume home tamsulosin and Flomax  Hypothyroidism -Resume home Synthroid 25 mcg daily  Thrombocytopenia Chronic. Stable.  Essential tremor Previously diagnosed. Has been evaluated by neurology.  Sick sinus syndrome S/p pacemaker   DVT prophylaxis: Warfarin Code Status:   Code Status: DNR Family Communication: None at bedside. Called son on telephone but no response Disposition Plan: Discharge home vs SNF likely in 2-5 days pending culture results, possible transition to oral antibiotics, PT/OT eval   Consultants:  None  Procedures:  None  Antimicrobials: Meropenem IV    Subjective: Patient reports no issues today. None overnight.  Objective: Vitals:   10/10/21 0500 10/10/21 0517 10/10/21 0600 10/10/21 0700  BP: (!) 126/53  (!) 137/58 (!) 137/55  Pulse: 69  69 70  Resp: (!) 24  (!) 22 17  Temp:      TempSrc:      SpO2: (!) 87% 90% 92% 92%  Weight:      Height:        Intake/Output Summary (Last 24 hours) at 10/10/2021 0756 Last data filed at 10/10/2021 0600 Gross per 24 hour  Intake 1709.17 ml  Output 800 ml  Net 909.17 ml   Filed Weights   10/08/21 2209 10/09/21 1315 10/10/21 0309  Weight: 93 kg 99.4 kg 99.5 kg    Examination:  General exam: Appears calm and comfortable and in no acute distress. Conversant Respiratory: Clear to auscultation. Respiratory effort normal with no intercostal retractions or use of accessory muscles Cardiovascular: S1 & S2 heard, RRR. No murmurs, rubs, gallops  or clicks.  Gastrointestinal: Abdomen is non-distended, soft and non-tender. No masses felt. Normal bowel sounds heard Neurologic: No focal neurological deficits. Tremor of R>L upper  extremity Musculoskeletal: No calf tenderness Skin: No cyanosis. No new rashes Psychiatry: Alert and oriented. Memory intact. Mood & affect appropriate    Data Reviewed: I have personally reviewed following labs and imaging studies  CBC Lab Results  Component Value Date   WBC 13.2 (H) 10/10/2021   RBC 3.19 (L) 10/10/2021   HGB 10.5 (L) 10/10/2021   HCT 31.9 (L) 10/10/2021   MCV 100.0 10/10/2021   MCH 32.9 10/10/2021   PLT 80 (L) 10/10/2021   MCHC 32.9 10/10/2021   RDW 15.4 10/10/2021   LYMPHSABS 0.3 (L) 10/08/2021   MONOABS 0.5 10/08/2021   EOSABS 0.0 10/08/2021   BASOSABS 0.0 97/94/8016     Last metabolic panel Lab Results  Component Value Date   NA 138 10/10/2021   K 3.8 10/10/2021   CL 108 10/10/2021   CO2 23 10/10/2021   BUN 34 (H) 10/10/2021   CREATININE 1.76 (H) 10/10/2021   GLUCOSE 111 (H) 10/10/2021   GFRNONAA 35 (L) 10/10/2021   GFRAA 45 (L) 08/24/2020   CALCIUM 8.6 (L) 10/10/2021   PHOS 3.8 02/01/2021   PROT 5.7 (L) 10/09/2021   ALBUMIN 3.2 (L) 10/09/2021   BILITOT 0.8 10/09/2021   ALKPHOS 94 10/09/2021   AST 65 (H) 10/09/2021   ALT 58 (H) 10/09/2021   ANIONGAP 7 10/10/2021    CBG (last 3)  No results for input(s): GLUCAP in the last 72 hours.   GFR: Estimated Creatinine Clearance: 30.1 mL/min (A) (by C-G formula based on SCr of 1.76 mg/dL (H)).  Coagulation Profile: Recent Labs  Lab 10/08/21 2202 10/10/21 0252  INR 2.3* 3.8*    Recent Results (from the past 240 hour(s))  Resp Panel by RT-PCR (Flu A&B, Covid) Nasopharyngeal Swab     Status: None   Collection Time: 10/08/21 10:02 PM   Specimen: Nasopharyngeal Swab; Nasopharyngeal(NP) swabs in vial transport medium  Result Value Ref Range Status   SARS Coronavirus 2 by RT PCR NEGATIVE NEGATIVE Final    Comment: (NOTE) SARS-CoV-2 target nucleic acids are NOT DETECTED.  The SARS-CoV-2 RNA is generally detectable in upper respiratory specimens during the acute phase of infection. The  lowest concentration of SARS-CoV-2 viral copies this assay can detect is 138 copies/mL. A negative result does not preclude SARS-Cov-2 infection and should not be used as the sole basis for treatment or other patient management decisions. A negative result may occur with  improper specimen collection/handling, submission of specimen other than nasopharyngeal swab, presence of viral mutation(s) within the areas targeted by this assay, and inadequate number of viral copies(<138 copies/mL). A negative result must be combined with clinical observations, patient history, and epidemiological information. The expected result is Negative.  Fact Sheet for Patients:  EntrepreneurPulse.com.au  Fact Sheet for Healthcare Providers:  IncredibleEmployment.be  This test is no t yet approved or cleared by the [place] FDA and  has been authorized for detection and/or diagnosis of SARS-CoV-2 by FDA under an Emergency Use Authorization (EUA). This EUA will remain  in effect (meaning this test can be used) for the duration of the COVID-19 declaration under Section 564(b)(1) of the Act, 21 U.S.C.section 360bbb-3(b)(1), unless the authorization is terminated  or revoked sooner.       Influenza A by PCR NEGATIVE NEGATIVE Final   Influenza B by PCR NEGATIVE NEGATIVE Final  Comment: (NOTE) The Xpert Xpress SARS-CoV-2/FLU/RSV plus assay is intended as an aid in the diagnosis of influenza from Nasopharyngeal swab specimens and should not be used as a sole basis for treatment. Nasal washings and aspirates are unacceptable for Xpert Xpress SARS-CoV-2/FLU/RSV testing.  Fact Sheet for Patients: EntrepreneurPulse.com.au  Fact Sheet for Healthcare Providers: IncredibleEmployment.be  This test is not yet approved or cleared by the [place] FDA and has been authorized for detection and/or diagnosis of SARS-CoV-2 by FDA under  an Emergency Use Authorization (EUA). This EUA will remain in effect (meaning this test can be used) for the duration of the COVID-19 declaration under Section 564(b)(1) of the Act, 21 U.S.C. section 360bbb-3(b)(1), unless the authorization is terminated or revoked.  Performed at [facility], [place] [address], [place], [place] [address]   Blood culture (routine single)     Status: None (Preliminary result)   Collection Time: 10/08/21 10:11 PM   Specimen: BLOOD  Result Value Ref Range Status   Specimen Description   Final    BLOOD RIGHT ANTECUBITAL Performed at [facility] [address], [place], [place] [address]    Special Requests   Final    BOTTLES DRAWN AEROBIC AND ANAEROBIC Blood Culture adequate volume Performed at [facility] [address], [place], [place] [address]    Culture  Setup Time   Final    GRAM NEGATIVE RODS AEROBIC BOTTLE ONLY CRITICAL RESULT CALLED TO, READ BACK BY AND VERIFIED WITH: PHARMD LEIGHANN P. 10/10/21@2 :28 BY TW Performed at [facility], [place] [address] [place], [place] [address]    Culture PENDING  Incomplete   Report Status PENDING  Incomplete  Blood Culture ID Panel (Reflexed)     Status: Abnormal   Collection Time: 10/08/21 10:11 PM  Result Value Ref Range Status   Enterococcus faecalis NOT DETECTED NOT DETECTED Final   Enterococcus Faecium NOT DETECTED NOT DETECTED Final   Listeria monocytogenes NOT DETECTED NOT DETECTED Final   Staphylococcus species NOT DETECTED NOT DETECTED Final   Staphylococcus aureus (BCID) NOT DETECTED NOT DETECTED Final   Staphylococcus epidermidis NOT DETECTED NOT DETECTED Final   Staphylococcus lugdunensis NOT DETECTED NOT DETECTED Final   Streptococcus species NOT DETECTED NOT DETECTED Final   Streptococcus agalactiae NOT DETECTED NOT DETECTED Final   Streptococcus pneumoniae NOT DETECTED NOT DETECTED Final   Streptococcus pyogenes NOT DETECTED  NOT DETECTED Final   A.calcoaceticus-baumannii NOT DETECTED NOT DETECTED Final   Bacteroides fragilis NOT DETECTED NOT DETECTED Final   Enterobacterales DETECTED (A) NOT DETECTED Final    Comment: Enterobacterales represent a large order of gram negative bacteria, not a single organism. CRITICAL RESULT CALLED TO, READ BACK BY AND VERIFIED WITH: PHARMD LEIGHANN P. 10/10/21@2 :28 BY TW    Enterobacter cloacae complex NOT DETECTED NOT DETECTED Final   Escherichia coli NOT DETECTED NOT DETECTED Final   Klebsiella aerogenes NOT DETECTED NOT DETECTED Final   Klebsiella oxytoca NOT DETECTED NOT DETECTED Final   Klebsiella pneumoniae NOT DETECTED NOT DETECTED Final   Proteus species DETECTED (A) NOT DETECTED Final    Comment: CRITICAL RESULT CALLED TO, READ BACK BY AND VERIFIED WITH: PHARMD LEIGHANN P. 10/10/21@2 :28 BY TW    Salmonella species NOT DETECTED NOT DETECTED Final   Serratia marcescens NOT DETECTED NOT DETECTED Final   Haemophilus influenzae NOT DETECTED NOT DETECTED Final   Neisseria meningitidis NOT DETECTED NOT DETECTED Final   Pseudomonas aeruginosa NOT DETECTED NOT DETECTED Final  Stenotrophomonas maltophilia NOT DETECTED NOT DETECTED Final   Candida albicans NOT DETECTED NOT DETECTED Final   Candida auris NOT DETECTED NOT DETECTED Final   Candida glabrata NOT DETECTED NOT DETECTED Final   Candida krusei NOT DETECTED NOT DETECTED Final   Candida parapsilosis NOT DETECTED NOT DETECTED Final   Candida tropicalis NOT DETECTED NOT DETECTED Final   Cryptococcus neoformans/gattii NOT DETECTED NOT DETECTED Final   CTX-M ESBL NOT DETECTED NOT DETECTED Final   Carbapenem resistance IMP NOT DETECTED NOT DETECTED Final   Carbapenem resistance KPC NOT DETECTED NOT DETECTED Final   Carbapenem resistance NDM NOT DETECTED NOT DETECTED Final   Carbapenem resist OXA 48 LIKE NOT DETECTED NOT DETECTED Final   Carbapenem resistance VIM NOT DETECTED NOT DETECTED Final    Comment: Performed at  [facility], [address] [place], [place] [address]  Urine Culture     Status: Abnormal   Collection Time: 10/08/21 10:22 PM   Specimen: In/Out Cath Urine  Result Value Ref Range Status   Specimen Description   Final    IN/OUT CATH URINE Performed at [facility], [place] [address], [place], [place] [address]    Special Requests   Final    NONE Performed at [facility], [place] [address], [place], [place] [address]    Culture 90,000 COLONIES/mL PROTEUS MIRABILIS (A)  Final   Report Status 10/10/2021 FINAL  Final   Organism ID, Bacteria PROTEUS MIRABILIS (A)  Final      Susceptibility   Proteus mirabilis - MIC*    AMPICILLIN <=2 SENSITIVE Sensitive     CEFAZOLIN <=4 SENSITIVE Sensitive     CEFEPIME <=0.12 SENSITIVE Sensitive     CEFTRIAXONE <=0.25 SENSITIVE Sensitive     CIPROFLOXACIN <=0.25 SENSITIVE Sensitive     GENTAMICIN <=1 SENSITIVE Sensitive     IMIPENEM 2 SENSITIVE Sensitive     NITROFURANTOIN 128 RESISTANT Resistant     TRIMETH/SULFA <=20 SENSITIVE Sensitive     AMPICILLIN/SULBACTAM <=2 SENSITIVE Sensitive     PIP/TAZO <=4 SENSITIVE Sensitive     * 90,000 COLONIES/mL PROTEUS MIRABILIS  MRSA Next Gen by PCR, Nasal     Status: None   Collection Time: 10/09/21  1:53 PM   Specimen: Nasal Mucosa; Nasal Swab  Result Value Ref Range Status   MRSA by PCR Next Gen NOT DETECTED NOT DETECTED Final    Comment: (NOTE) The GeneXpert MRSA Assay (FDA approved for NASAL specimens only), is one component of a comprehensive MRSA colonization surveillance program. It is not intended to diagnose MRSA infection nor to guide or monitor treatment for MRSA infections. Test performance is not FDA approved in patients less than 69 years old. Performed at [facility], [place] [address], [place], [place] [address]         Radiology Studies: DG Chest Port 1 View  Result Date: 10/08/2021 CLINICAL DATA:  Tremors, questionable  sepsis EXAM: PORTABLE CHEST 1 VIEW COMPARISON:  07/02/2021 FINDINGS: Frontal view of the chest demonstrates stable pacer. Cardiac silhouette is unremarkable. Continued ectasia of the thoracic aorta. Linear consolidation at the left lung base may reflect scarring or atelectasis. No acute airspace disease, effusion, or pneumothorax. No acute bony abnormalities. IMPRESSION: 1. Linear consolidation left lung base likely representing atelectasis or scarring. 2. Otherwise unremarkable exam. Electronically Signed   By: Randa Ngo M.D.   On: 10/08/2021 22:28        Scheduled Meds:  Chlorhexidine Gluconate Cloth  6  each Topical Daily   Warfarin - Pharmacist Dosing Inpatient   Does not apply q1600   Continuous Infusions:  meropenem (MERREM) IV Stopped (10/10/21 0530)     LOS: 1 day     Cordelia Poche, MD [facility] 10/10/2021, 7:56 AM  If 7PM-7AM, please contact night-coverage [email or web address]

## 2021-10-10 NOTE — Progress Notes (Signed)
Informed of RED MEWS triggered by fever, hypertension, and tachypnea. Patient reportedly alert, oriented, Tylenol administered, and environmental cooling of room and patient with ice in process. No other acute distress at this time. Unit nurses monitoring care needs at this time and will notify RR if any changes occur.

## 2021-10-10 NOTE — Progress Notes (Signed)
PHARMACY - PHYSICIAN COMMUNICATION CRITICAL VALUE ALERT - BLOOD CULTURE IDENTIFICATION (BCID)  Douglas Walker is an 87 y.o. male who presented to [facility] on 10/08/2021 withsepsis due to UTI.  Patient with history of ESBL  Assessment:  BCID + for Proteus.  No ESBL detected.  Patient also with a Urine Culture in process (currently growing GNR; organism not yet identified)   Name of physician (or Provider) Contacted: Gershon Cull, FNP  Current antibiotics: Meropenem  Changes to prescribed antibiotics recommended:  Await urine culture result before narrowing antibiotics. Patient is on recommended antibiotics - No changes needed  Results for orders placed or performed during the hospital encounter of 10/08/21  Blood Culture ID Panel (Reflexed) (Collected: 10/08/2021 10:11 PM)  Result Value Ref Range   Enterococcus faecalis NOT DETECTED NOT DETECTED   Enterococcus Faecium NOT DETECTED NOT DETECTED   Listeria monocytogenes NOT DETECTED NOT DETECTED   Staphylococcus species NOT DETECTED NOT DETECTED   Staphylococcus aureus (BCID) NOT DETECTED NOT DETECTED   Staphylococcus epidermidis NOT DETECTED NOT DETECTED   Staphylococcus lugdunensis NOT DETECTED NOT DETECTED   Streptococcus species NOT DETECTED NOT DETECTED   Streptococcus agalactiae NOT DETECTED NOT DETECTED   Streptococcus pneumoniae NOT DETECTED NOT DETECTED   Streptococcus pyogenes NOT DETECTED NOT DETECTED   A.calcoaceticus-baumannii NOT DETECTED NOT DETECTED   Bacteroides fragilis NOT DETECTED NOT DETECTED   Enterobacterales DETECTED (A) NOT DETECTED   Enterobacter cloacae complex NOT DETECTED NOT DETECTED   Escherichia coli NOT DETECTED NOT DETECTED   Klebsiella aerogenes NOT DETECTED NOT DETECTED   Klebsiella oxytoca NOT DETECTED NOT DETECTED   Klebsiella pneumoniae NOT DETECTED NOT DETECTED   Proteus species DETECTED (A) NOT DETECTED   Salmonella species NOT DETECTED NOT DETECTED   Serratia marcescens NOT  DETECTED NOT DETECTED   Haemophilus influenzae NOT DETECTED NOT DETECTED   Neisseria meningitidis NOT DETECTED NOT DETECTED   Pseudomonas aeruginosa NOT DETECTED NOT DETECTED   Stenotrophomonas maltophilia NOT DETECTED NOT DETECTED   Candida albicans NOT DETECTED NOT DETECTED   Candida auris NOT DETECTED NOT DETECTED   Candida glabrata NOT DETECTED NOT DETECTED   Candida krusei NOT DETECTED NOT DETECTED   Candida parapsilosis NOT DETECTED NOT DETECTED   Candida tropicalis NOT DETECTED NOT DETECTED   Cryptococcus neoformans/gattii NOT DETECTED NOT DETECTED   CTX-M ESBL NOT DETECTED NOT DETECTED   Carbapenem resistance IMP NOT DETECTED NOT DETECTED   Carbapenem resistance KPC NOT DETECTED NOT DETECTED   Carbapenem resistance NDM NOT DETECTED NOT DETECTED   Carbapenem resist OXA 48 LIKE NOT DETECTED NOT DETECTED   Carbapenem resistance VIM NOT DETECTED NOT DETECTED    Everette Rank, PharmD 10/10/2021  2:50 AM

## 2021-10-11 DIAGNOSIS — R7881 Bacteremia: Secondary | ICD-10-CM | POA: Diagnosis not present

## 2021-10-11 DIAGNOSIS — B964 Proteus (mirabilis) (morganii) as the cause of diseases classified elsewhere: Secondary | ICD-10-CM

## 2021-10-11 DIAGNOSIS — N400 Enlarged prostate without lower urinary tract symptoms: Secondary | ICD-10-CM | POA: Diagnosis not present

## 2021-10-11 DIAGNOSIS — I480 Paroxysmal atrial fibrillation: Secondary | ICD-10-CM | POA: Diagnosis not present

## 2021-10-11 DIAGNOSIS — N390 Urinary tract infection, site not specified: Secondary | ICD-10-CM

## 2021-10-11 DIAGNOSIS — D696 Thrombocytopenia, unspecified: Secondary | ICD-10-CM

## 2021-10-11 DIAGNOSIS — N1832 Chronic kidney disease, stage 3b: Secondary | ICD-10-CM

## 2021-10-11 DIAGNOSIS — A419 Sepsis, unspecified organism: Secondary | ICD-10-CM

## 2021-10-11 LAB — BASIC METABOLIC PANEL
Anion gap: 9 (ref 5–15)
BUN: 31 mg/dL — ABNORMAL HIGH (ref 8–23)
CO2: 22 mmol/L (ref 22–32)
Calcium: 9 mg/dL (ref 8.9–10.3)
Chloride: 109 mmol/L (ref 98–111)
Creatinine, Ser: 1.4 mg/dL — ABNORMAL HIGH (ref 0.61–1.24)
GFR, Estimated: 47 mL/min — ABNORMAL LOW (ref >60)
Glucose, Bld: 120 mg/dL — ABNORMAL HIGH (ref 70–99)
Potassium: 4.2 mmol/L (ref 3.5–5.1)
Sodium: 140 mmol/L (ref 135–145)

## 2021-10-11 LAB — PROTIME-INR
INR: 2.7 — ABNORMAL HIGH (ref 0.8–1.2)
Prothrombin Time: 28.9 seconds — ABNORMAL HIGH (ref 11.4–15.2)

## 2021-10-11 LAB — CBC
HCT: 31.9 % — ABNORMAL LOW (ref 39.0–52.0)
Hemoglobin: 10.7 g/dL — ABNORMAL LOW (ref 13.0–17.0)
MCH: 33.5 pg (ref 26.0–34.0)
MCHC: 33.5 g/dL (ref 30.0–36.0)
MCV: 100 fL (ref 80.0–100.0)
Platelets: 68 10*3/uL — ABNORMAL LOW (ref 150–400)
RBC: 3.19 MIL/uL — ABNORMAL LOW (ref 4.22–5.81)
RDW: 15.3 % (ref 11.5–15.5)
WBC: 11.7 10*3/uL — ABNORMAL HIGH (ref 4.0–10.5)
nRBC: 0 % (ref 0.0–0.2)

## 2021-10-11 LAB — PROCALCITONIN: Procalcitonin: 2.73 ng/mL

## 2021-10-11 MED ORDER — WARFARIN SODIUM 5 MG PO TABS
5.0000 mg | ORAL_TABLET | Freq: Once | ORAL | Status: AC
  Administered 2021-10-11: 5 mg via ORAL
  Filled 2021-10-11: qty 1

## 2021-10-11 MED ORDER — SODIUM CHLORIDE 0.9 % IV SOLN
2.0000 g | Freq: Four times a day (QID) | INTRAVENOUS | Status: DC
  Administered 2021-10-11 – 2021-10-15 (×16): 2 g via INTRAVENOUS
  Filled 2021-10-11: qty 2
  Filled 2021-10-11 (×5): qty 2000
  Filled 2021-10-11: qty 2
  Filled 2021-10-11: qty 2000
  Filled 2021-10-11 (×5): qty 2
  Filled 2021-10-11: qty 2000
  Filled 2021-10-11: qty 2
  Filled 2021-10-11: qty 2000

## 2021-10-11 MED ORDER — POLYETHYLENE GLYCOL 3350 17 G PO PACK
17.0000 g | PACK | Freq: Every day | ORAL | Status: DC
  Administered 2021-10-11 – 2021-10-14 (×4): 17 g via ORAL
  Filled 2021-10-11 (×5): qty 1

## 2021-10-11 NOTE — Consult Note (Signed)
[facility] Infectious [facility]    Date of Admission:  10/08/2021     Reason for Consult:Bacteremia     Referring Physician: Dr Lonny Prude  Current antibiotics: Ampicillin 10/12-present  Previous antibiotics: Meropenem 10/10-10/11 Ceftriaxone x 1 dose 10/10  ASSESSMENT:    87 y.o. male admitted with:  Proteus bacteremia: Secondary to complicated urinary tract infection in the setting of BPH and chronic Foley catheter.  Foley catheter exchanged upon admission and patient improved on antibiotics.  Severe sepsis: Secondary to #1. AKI on CKD: Creatine baseline 1.3-1.4.  Peaked at 1.77 and down to 1.4 today. BPH with history of recurrent UTI and chronic Foley: Follows with urology as an outpatient.  On Macrobid suppression per urology. Sick sinus syndrome: Status post pacemaker. Atrial fibrillation: On Warfarin outpatient.  Thrombocytopenia: Chronic issue potentially exacerbated by sepsis.  Relatively stable.   RECOMMENDATIONS:    Continue ampicillin Routine lab monitoring Will follow for transition to PO therapy and recommendations on duration of therapy   Principal Problem:   Sepsis secondary to UTI [facility]) Active Problems:   Thrombocytopenia (HCC)   Paroxysmal atrial fibrillation (HCC)   CKD (chronic kidney disease) stage 3, GFR 30-59 ml/min: Probable   Chronic diastolic congestive heart failure (HCC)   Benign prostatic hyperplasia   Cerebrovascular accident (CVA) due to embolism of left middle cerebral artery (HCC)   MEDICATIONS:    Scheduled Meds:  Chlorhexidine Gluconate Cloth  6 each Topical Daily   DULoxetine  60 mg Oral Daily   levothyroxine  25 mcg Oral Q0600   pregabalin  25 mg Oral QHS   topiramate  50 mg Oral BID   warfarin  5 mg Oral ONCE-1600   Warfarin - Pharmacist Dosing Inpatient   Does not apply q1600   Continuous Infusions:  ampicillin (OMNIPEN) IV     PRN Meds:.acetaminophen **OR** acetaminophen, ondansetron **OR** ondansetron (ZOFRAN)  IV, phenol  HPI:    Douglas Walker is a 87 y.o. male with atrial fibrillation, SSS s/p PPM, hx of CVA, HFpEF, BPH with chronic Foley, CKD, hx of recurrent UTI on prophylactic Macrobid, and essential tremor who presented 10/08/21 with chills, fevers, and confusion.  Found to be septic secondary to UTI with urine cultures growing Proteus and blood cultures growing GNR (BCID with Proteus).  He was initially on meropenem given history of ESBL, however, was narrowed to ampicillin based on susceptibilities from his urine cultures.  His Foley catheter was exchanged upon admission.  His WBC has improved from 19.6 down to 11.7 and his creatinine has improved as well from peak of 1.77 (baseline 1.3-1.4).  Over the last 24 hrs he has been febrile with Tmax of 103.1 but this seems to be improved with antibiotics.  We have been consulted for further recommendations.    Past Medical History:  Diagnosis Date   Arthritis    shoulders and back   Cancer ([place])    skin cancers   Chronic low back pain 12/15/2017   CKD (chronic kidney disease) stage 3, GFR 30-59 ml/min (HCC)    Coronary artery disease    Dysrhythmia     Paroxysmal atrial fibrillation; Tachycardia-bradycardia syndrome   Essential tremor 02/28/2016   GERD (gastroesophageal reflux disease)    Hernia    History of hiatal hernia    Hyperlipemia    Hypertension    Macular degeneration    Neuromuscular disorder (HCC)    neuropathy   Paroxysmal atrial fibrillation ([name]) 10/28/2013   Presence of permanent cardiac  pacemaker 12/04/2018   Stroke ([name]) 12/2020   Tachycardia-bradycardia syndrome ([place]) 09/13/2014   TIA (transient ischemic attack)     Social History   Tobacco Use   Smoking status: Former    Types: Cigarettes    Quit date: 12/30/1962    Years since quitting: 58.8   Smokeless tobacco: Never  Vaping Use   Vaping Use: Never used  Substance Use Topics   Alcohol use: No   Drug use: No    Family History  Problem Relation Age of  Onset   GI problems Mother    Other Sister        PAIN ISSUES   Hearing loss Sister    Blindness Sister    Heart attack Neg Hx    Stroke Neg Hx     Allergies  Allergen Reactions   Sulfa Antibiotics Other (See Comments)    Weakness, dizziness   Primidone     Other reaction(s): increased sedation   Simvastatin     Other reaction(s): arthralgias and fatigue, severe    Review of Systems  Constitutional:  Positive for chills, fever and malaise/fatigue.  Respiratory: Negative.    Cardiovascular: Negative.   Gastrointestinal:  Positive for abdominal pain. Negative for nausea and vomiting.  Skin: Negative.   Neurological:        + Confusion.  All other systems reviewed and are negative.  OBJECTIVE:   Blood pressure (!) 130/93, pulse 70, temperature 98.5 F (36.9 C), temperature source Oral, resp. rate (!) 22, height 5' 9.5" (1.765 m), weight 102.8 kg, SpO2 93 %. Body mass index is 32.99 kg/m.  Physical Exam Constitutional:      General: He is not in acute distress.    Appearance: Normal appearance.  HENT:     Head: Normocephalic and atraumatic.  Cardiovascular:     Rate and Rhythm: Normal rate and regular rhythm.  Pulmonary:     Effort: Pulmonary effort is normal. No respiratory distress.     Comments: Diminished breath sounds with crackles at the bases.  Abdominal:     General: Abdomen is flat. There is no distension.     Palpations: Abdomen is soft.     Tenderness: There is abdominal tenderness. There is no guarding or rebound.  Musculoskeletal:        General: Normal range of motion.  Skin:    General: Skin is warm and dry.     Findings: No rash.  Neurological:     General: No focal deficit present.     Mental Status: He is alert and oriented to person, place, and time.     Cranial Nerves: No cranial nerve deficit.     Comments: Tremor noted.   Psychiatric:        Mood and Affect: Mood normal.        Behavior: Behavior normal.     Lab Results: Lab  Results  Component Value Date   WBC 11.7 (H) 10/11/2021   HGB 10.7 (L) 10/11/2021   HCT 31.9 (L) 10/11/2021   MCV 100.0 10/11/2021   PLT 68 (L) 10/11/2021    Lab Results  Component Value Date   NA 140 10/11/2021   K 4.2 10/11/2021   CO2 22 10/11/2021   GLUCOSE 120 (H) 10/11/2021   BUN 31 (H) 10/11/2021   CREATININE 1.40 (H) 10/11/2021   CALCIUM 9.0 10/11/2021   GFRNONAA 47 (L) 10/11/2021   GFRAA 45 (L) 08/24/2020    Lab Results  Component Value Date   ALT  58 (H) 10/09/2021   AST 65 (H) 10/09/2021   ALKPHOS 94 10/09/2021   BILITOT 0.8 10/09/2021    No results found for: CRP  No results found for: ESRSEDRATE  I have reviewed the micro and lab results in Epic.  Imaging: No results found.   Imaging independently reviewed in Epic.  Raynelle [facility] for Infectious Disease [facility] [phone] pager 10/11/2021, 11:04 AM

## 2021-10-11 NOTE — Progress Notes (Signed)
PHARMACY NOTE:  ANTIMICROBIAL RENAL DOSAGE ADJUSTMENT  Current antimicrobial regimen includes a mismatch between antimicrobial dosage and estimated renal function.  As per policy approved by the Pharmacy & Therapeutics and Medical Executive Committees, the antimicrobial dosage will be adjusted accordingly.  Current antimicrobial dosage: Ampicillin 2gm q8  Indication: Proteus Bacteremia, UTI  Renal Function:   Estimated Creatinine Clearance: 38.5 mL/min (A) (by C-G formula based on SCr of 1.4 mg/dL (H)). []      On intermittent HD, scheduled: []      On CRRT    Antimicrobial dosage has been changed to:  Ampicillin 2gm q6   Additional Comments: monitoring renal fx closely, await GNR ID/sens BCID, Urine Cx growing Proteus mirabilis   Thank you for allowing pharmacy to be a part of this patient's care.  Minda Ditto PharmD 10/11/2021 10:09 AM

## 2021-10-11 NOTE — Progress Notes (Signed)
PROGRESS NOTE    Douglas Walker  TOI:[id number] DOB: 08/09/1925 DOA: 10/08/2021 PCP: Josetta Huddle, MD   Brief Narrative: Douglas Walker is a 87 y.o. male with a history of sick sinus syndrome with pacer, paroxysmal atrial fibrillation on warfarin, history of CVA, chronic diastolic CHF, Foley catheter, and recurrent UTI with ESBL. Patient presented secondary to chills and confusion with concern for UTI/sepsis. Empiric antibiotics initiated and cultures obtained with urine culture significant for proteus mirabilis. Blood culture pending.   Assessment & Plan:   Principal Problem:   Sepsis secondary to UTI [facility]) Active Problems:   Thrombocytopenia (HCC)   Paroxysmal atrial fibrillation (HCC)   CKD (chronic kidney disease) stage 3, GFR 30-59 ml/min: Probable   Chronic diastolic congestive heart failure (HCC)   Benign prostatic hyperplasia   Cerebrovascular accident (CVA) due to embolism of left middle cerebral artery ([place])   Severe sepsis Patient on admission with a Tmax of 39.2 C with associated WBC of 19,600 and lactic acid of 2.1. Secondary to UTI/pyelonephritis. Meropenem IV started empirically for history of ESBL infection. Urine and blood cultures obtained on admission and are significant for proteus mirabilis. Meropenem transitioned to ampicillin IV. Procalcitonin trending down. WBC trending down.  CAUTI/Pyelonephritis Bacteremia Present on admission. Foley catheter exchanged this admission. Cultures significant for proteus mirabilis. No flank pain. No recurrent/persistent fevers. Urine culture sensitivities not consistent with ESBL; blood culture sensitivities pending. Fevers overnight but none this morning. No symptoms concerning for abscess formation at this time. -Continue ampicillin IV -Consult ID today  Paroxysmal atrial fibrillation In sinus rhythm. Patient is on warfarin as an outpatient.  Chronic diastolic heart failure Stable. EF of 50-55% in January  2022. Patient is on Lasix as an outpatient which was held on admission secondary to sepsis. -Continue to hold Lasix; may resume tomorrow -Strict in and out  History of CVA -Continue Warfarin  AKI on CKD stage IIIb Baseline creatinine of about 1.3-1.4. Creatinine of 1.51 on admission with worsening up to peak of 1.77. In setting of hypotension. Resolved.  Chronic foley catheter BPH Patient follows with Urology. He is on nitrofurantoin, finasteride/tamsulosin (recently discontinued) as an outpatient.  Acute respiratory failure with hypoxia Hypoxic. No symptoms noted.  -Wean to room air  Hypothyroidism -Continue home Synthroid 25 mcg daily  Thrombocytopenia Chronic. Stable.  Essential tremor Previously diagnosed. Has been evaluated by neurology.  Sick sinus syndrome S/p pacemaker  35 minutes spent including discussing the plan with patient/nursing/family, calling/discussing case with consulting physician, reviewing/ordering labs.   DVT prophylaxis: Warfarin Code Status:   Code Status: DNR Family Communication: Son and daughter-in-law via telephone (26 minutes) Disposition Plan: Discharge home vs SNF likely in 2-4 days pending culture results, possible transition to oral antibiotics, PT/OT eval, ID recommendations   Consultants:  Infectious disease  Procedures:  None  Antimicrobials: Meropenem IV  Ampicillin IV   Subjective: No concerns this morning. Discussed need for SNF and he recommended that I discuss with his son, Douglas Walker.  Objective: Vitals:   10/10/21 2338 10/11/21 0039 10/11/21 0440 10/11/21 0441  BP: (!) 124/58 132/64 136/60   Pulse: 70 70 70   Resp: (!) 22 (!) 24 (!) 22   Temp: 98.5 F (36.9 C) 98.8 F (37.1 C) 98.2 F (36.8 C)   TempSrc: Axillary Axillary Axillary   SpO2: 93% 93% 91%   Weight:    102.8 kg  Height:        Intake/Output Summary (Last 24 hours) at 10/11/2021 8338 Last data  filed at 10/11/2021 4401 Gross per 24 hour  Intake  820 ml  Output 1500 ml  Net -680 ml    Filed Weights   10/09/21 1315 10/10/21 0309 10/11/21 0441  Weight: 99.4 kg 99.5 kg 102.8 kg    Examination:  General exam: Appears calm and comfortable Respiratory system: Clear to auscultation. Respiratory effort normal. Cardiovascular system: S1 & S2 heard, RRR. No murmurs, rubs, gallops or clicks. Gastrointestinal system: Abdomen is nondistended, soft and nontender. No organomegaly or masses felt. Normal bowel sounds heard. Central nervous system: Alert and oriented. Musculoskeletal: No edema. No calf tenderness Skin: No cyanosis. No rashes Psychiatry: Judgement and insight appear impaired.    Data Reviewed: I have personally reviewed following labs and imaging studies  CBC Lab Results  Component Value Date   WBC 11.7 (H) 10/11/2021   RBC 3.19 (L) 10/11/2021   HGB 10.7 (L) 10/11/2021   HCT 31.9 (L) 10/11/2021   MCV 100.0 10/11/2021   MCH 33.5 10/11/2021   PLT 68 (L) 10/11/2021   MCHC 33.5 10/11/2021   RDW 15.3 10/11/2021   LYMPHSABS 0.3 (L) 10/08/2021   MONOABS 0.5 10/08/2021   EOSABS 0.0 10/08/2021   BASOSABS 0.0 02/72/5366     Last metabolic panel Lab Results  Component Value Date   NA 140 10/11/2021   K 4.2 10/11/2021   CL 109 10/11/2021   CO2 22 10/11/2021   BUN 31 (H) 10/11/2021   CREATININE 1.40 (H) 10/11/2021   GLUCOSE 120 (H) 10/11/2021   GFRNONAA 47 (L) 10/11/2021   GFRAA 45 (L) 08/24/2020   CALCIUM 9.0 10/11/2021   PHOS 3.8 02/01/2021   PROT 5.7 (L) 10/09/2021   ALBUMIN 3.2 (L) 10/09/2021   BILITOT 0.8 10/09/2021   ALKPHOS 94 10/09/2021   AST 65 (H) 10/09/2021   ALT 58 (H) 10/09/2021   ANIONGAP 9 10/11/2021    CBG (last 3)  No results for input(s): GLUCAP in the last 72 hours.   GFR: Estimated Creatinine Clearance: 38.5 mL/min (A) (by C-G formula based on SCr of 1.4 mg/dL (H)).  Coagulation Profile: Recent Labs  Lab 10/08/21 2202 10/10/21 0252 10/11/21 0443  INR 2.3* 3.8* 2.7*      Recent Results (from the past 240 hour(s))  Resp Panel by RT-PCR (Flu A&B, Covid) Nasopharyngeal Swab     Status: None   Collection Time: 10/08/21 10:02 PM   Specimen: Nasopharyngeal Swab; Nasopharyngeal(NP) swabs in vial transport medium  Result Value Ref Range Status   SARS Coronavirus 2 by RT PCR NEGATIVE NEGATIVE Final    Comment: (NOTE) SARS-CoV-2 target nucleic acids are NOT DETECTED.  The SARS-CoV-2 RNA is generally detectable in upper respiratory specimens during the acute phase of infection. The lowest concentration of SARS-CoV-2 viral copies this assay can detect is 138 copies/mL. A negative result does not preclude SARS-Cov-2 infection and should not be used as the sole basis for treatment or other patient management decisions. A negative result may occur with  improper specimen collection/handling, submission of specimen other than nasopharyngeal swab, presence of viral mutation(s) within the areas targeted by this assay, and inadequate number of viral copies(<138 copies/mL). A negative result must be combined with clinical observations, patient history, and epidemiological information. The expected result is Negative.  Fact Sheet for Patients:  EntrepreneurPulse.com.au  Fact Sheet for Healthcare Providers:  IncredibleEmployment.be  This test is no t yet approved or cleared by the [place] FDA and  has been authorized for detection and/or diagnosis of SARS-CoV-2 by FDA  under an Emergency Use Authorization (EUA). This EUA will remain  in effect (meaning this test can be used) for the duration of the COVID-19 declaration under Section 564(b)(1) of the Act, 21 U.S.C.section 360bbb-3(b)(1), unless the authorization is terminated  or revoked sooner.       Influenza A by PCR NEGATIVE NEGATIVE Final   Influenza B by PCR NEGATIVE NEGATIVE Final    Comment: (NOTE) The Xpert Xpress SARS-CoV-2/FLU/RSV plus assay is intended as  an aid in the diagnosis of influenza from Nasopharyngeal swab specimens and should not be used as a sole basis for treatment. Nasal washings and aspirates are unacceptable for Xpert Xpress SARS-CoV-2/FLU/RSV testing.  Fact Sheet for Patients: EntrepreneurPulse.com.au  Fact Sheet for Healthcare Providers: IncredibleEmployment.be  This test is not yet approved or cleared by the [place] FDA and has been authorized for detection and/or diagnosis of SARS-CoV-2 by FDA under an Emergency Use Authorization (EUA). This EUA will remain in effect (meaning this test can be used) for the duration of the COVID-19 declaration under Section 564(b)(1) of the Act, 21 U.S.C. section 360bbb-3(b)(1), unless the authorization is terminated or revoked.  Performed at [facility], [place] [address] [place], [place] [address]   Blood culture (routine single)     Status: None (Preliminary result)   Collection Time: 10/08/21 10:11 PM   Specimen: BLOOD  Result Value Ref Range Status   Specimen Description   Final    BLOOD RIGHT ANTECUBITAL Performed at [facility] [address], [place], [place] [address]    Special Requests   Final    BOTTLES DRAWN AEROBIC AND ANAEROBIC Blood Culture adequate volume Performed at [facility] [address] [place], [place] [address]    Culture  Setup Time   Final    GRAM NEGATIVE RODS AEROBIC BOTTLE ONLY CRITICAL RESULT CALLED TO, READ BACK BY AND VERIFIED WITH: PHARMD LEIGHANN P. 10/10/21@2 :28 BY TW Performed at [facility], [place] [address], [place], [place] [address]    Culture GRAM NEGATIVE RODS  Final   Report Status PENDING  Incomplete  Blood Culture ID Panel (Reflexed)     Status: Abnormal   Collection Time: 10/08/21 10:11 PM  Result Value Ref Range Status   Enterococcus faecalis NOT DETECTED NOT DETECTED Final   Enterococcus Faecium NOT DETECTED NOT  DETECTED Final   Listeria monocytogenes NOT DETECTED NOT DETECTED Final   Staphylococcus species NOT DETECTED NOT DETECTED Final   Staphylococcus aureus (BCID) NOT DETECTED NOT DETECTED Final   Staphylococcus epidermidis NOT DETECTED NOT DETECTED Final   Staphylococcus lugdunensis NOT DETECTED NOT DETECTED Final   Streptococcus species NOT DETECTED NOT DETECTED Final   Streptococcus agalactiae NOT DETECTED NOT DETECTED Final   Streptococcus pneumoniae NOT DETECTED NOT DETECTED Final   Streptococcus pyogenes NOT DETECTED NOT DETECTED Final   A.calcoaceticus-baumannii NOT DETECTED NOT DETECTED Final   Bacteroides fragilis NOT DETECTED NOT DETECTED Final   Enterobacterales DETECTED (A) NOT DETECTED Final    Comment: Enterobacterales represent a large order of gram negative bacteria, not a single organism. CRITICAL RESULT CALLED TO, READ BACK BY AND VERIFIED WITH: PHARMD LEIGHANN P. 10/10/21@2 :28 BY TW    Enterobacter cloacae complex NOT DETECTED NOT DETECTED Final   Escherichia coli NOT DETECTED NOT DETECTED Final   Klebsiella aerogenes NOT DETECTED NOT DETECTED Final   Klebsiella oxytoca NOT DETECTED NOT DETECTED Final   Klebsiella pneumoniae NOT DETECTED NOT DETECTED Final   Proteus species DETECTED (A)  NOT DETECTED Final    Comment: CRITICAL RESULT CALLED TO, READ BACK BY AND VERIFIED WITH: PHARMD LEIGHANN P. 10/10/21@2 :28 BY TW    Salmonella species NOT DETECTED NOT DETECTED Final   Serratia marcescens NOT DETECTED NOT DETECTED Final   Haemophilus influenzae NOT DETECTED NOT DETECTED Final   Neisseria meningitidis NOT DETECTED NOT DETECTED Final   Pseudomonas aeruginosa NOT DETECTED NOT DETECTED Final   Stenotrophomonas maltophilia NOT DETECTED NOT DETECTED Final   Candida albicans NOT DETECTED NOT DETECTED Final   Candida auris NOT DETECTED NOT DETECTED Final   Candida glabrata NOT DETECTED NOT DETECTED Final   Candida krusei NOT DETECTED NOT DETECTED Final   Candida  parapsilosis NOT DETECTED NOT DETECTED Final   Candida tropicalis NOT DETECTED NOT DETECTED Final   Cryptococcus neoformans/gattii NOT DETECTED NOT DETECTED Final   CTX-M ESBL NOT DETECTED NOT DETECTED Final   Carbapenem resistance IMP NOT DETECTED NOT DETECTED Final   Carbapenem resistance KPC NOT DETECTED NOT DETECTED Final   Carbapenem resistance NDM NOT DETECTED NOT DETECTED Final   Carbapenem resist OXA 48 LIKE NOT DETECTED NOT DETECTED Final   Carbapenem resistance VIM NOT DETECTED NOT DETECTED Final    Comment: Performed at [facility], [address], [place], [place] [address]  Urine Culture     Status: Abnormal   Collection Time: 10/08/21 10:22 PM   Specimen: In/Out Cath Urine  Result Value Ref Range Status   Specimen Description   Final    IN/OUT CATH URINE Performed at [facility], [place] [address], [place], [place] [address]    Special Requests   Final    NONE Performed at [facility], [place] [address] [place], [place] [address]    Culture 90,000 COLONIES/mL PROTEUS MIRABILIS (A)  Final   Report Status 10/10/2021 FINAL  Final   Organism ID, Bacteria PROTEUS MIRABILIS (A)  Final      Susceptibility   Proteus mirabilis - MIC*    AMPICILLIN <=2 SENSITIVE Sensitive     CEFAZOLIN <=4 SENSITIVE Sensitive     CEFEPIME <=0.12 SENSITIVE Sensitive     CEFTRIAXONE <=0.25 SENSITIVE Sensitive     CIPROFLOXACIN <=0.25 SENSITIVE Sensitive     GENTAMICIN <=1 SENSITIVE Sensitive     IMIPENEM 2 SENSITIVE Sensitive     NITROFURANTOIN 128 RESISTANT Resistant     TRIMETH/SULFA <=20 SENSITIVE Sensitive     AMPICILLIN/SULBACTAM <=2 SENSITIVE Sensitive     PIP/TAZO <=4 SENSITIVE Sensitive     * 90,000 COLONIES/mL PROTEUS MIRABILIS  MRSA Next Gen by PCR, Nasal     Status: None   Collection Time: 10/09/21  1:53 PM   Specimen: Nasal Mucosa; Nasal Swab  Result Value Ref Range Status   MRSA by PCR Next Gen NOT DETECTED NOT DETECTED Final     Comment: (NOTE) The GeneXpert MRSA Assay (FDA approved for NASAL specimens only), is one component of a comprehensive MRSA colonization surveillance program. It is not intended to diagnose MRSA infection nor to guide or monitor treatment for MRSA infections. Test performance is not FDA approved in patients less than 41 years old. Performed at [facility], [place] [address] [place], [place] [address]          Radiology Studies: No results found.      Scheduled Meds:  Chlorhexidine Gluconate Cloth  6 each Topical Daily   DULoxetine  60 mg Oral Daily   levothyroxine  25 mcg Oral Q0600   pregabalin  25 mg Oral QHS   topiramate  50 mg Oral BID   Warfarin - Pharmacist Dosing Inpatient   Does not apply q1600   Continuous Infusions:  ampicillin (OMNIPEN) IV       LOS: 2 days     Cordelia Poche, MD [facility] 10/11/2021, 8:21 AM  If 7PM-7AM, please contact night-coverage [email or web address]

## 2021-10-11 NOTE — Progress Notes (Signed)
[facility] for Warfarin Indication: atrial fibrillation  Allergies  Allergen Reactions   Sulfa Antibiotics Other (See Comments)    Weakness, dizziness   Primidone     Other reaction(s): increased sedation   Simvastatin     Other reaction(s): arthralgias and fatigue, severe   Patient Measurements: Height: 5' 9.5" (176.5 cm) Weight: 102.8 kg (226 lb 10.1 oz) IBW/kg (Calculated) : 71.85  Vital Signs: Temp: 98.2 F (36.8 C) (10/13 0440) Temp Source: Axillary (10/13 0440) BP: 136/60 (10/13 0440) Pulse Rate: 70 (10/13 0440)  Labs: Recent Labs    10/08/21 2202 10/09/21 0537 10/10/21 0252 10/11/21 0443  HGB 12.1* 10.1* 10.5* 10.7*  HCT 37.0* 31.2* 31.9* 31.9*  PLT 107* 82* 80* 68*  APTT 39*  --   --   --   LABPROT 25.1*  --  37.7* 28.9*  INR 2.3*  --  3.8* 2.7*  CREATININE 1.51* 1.77* 1.76* 1.40*    Estimated Creatinine Clearance: 38.5 mL/min (A) (by C-G formula based on SCr of 1.4 mg/dL (H)).  Medications: PTA Warfarin  Assessment: 87 yr male admitted with suspected sepsis secondary to UTI with a history of ESBL infections.  PMH significant for chronic dCHF, PAF, CVS, CKD and thrombocytopenia Patient on warfarin PTA for Afib at a reported dose of 7.5mg  daily except 10mg  on Fridays, last dose PTA reported as being taken 10/10 at 1830  10/11/21 INR 2.7 today - in therapeutic range (Warfarin held 10/12 for INR 3.8) CBC ok No reported bleeding Per charting, eating 70-75% meals  Goal of Therapy:  INR 2-3   Plan:  Warfarin 5mg  today at 1600 Daily PT/INR  Minda Ditto PharmD [facility] [phone] 10/11/2021, 10:07 AM

## 2021-10-12 DIAGNOSIS — R7881 Bacteremia: Secondary | ICD-10-CM | POA: Diagnosis not present

## 2021-10-12 DIAGNOSIS — N400 Enlarged prostate without lower urinary tract symptoms: Secondary | ICD-10-CM | POA: Diagnosis not present

## 2021-10-12 DIAGNOSIS — R5081 Fever presenting with conditions classified elsewhere: Secondary | ICD-10-CM | POA: Diagnosis not present

## 2021-10-12 DIAGNOSIS — N1832 Chronic kidney disease, stage 3b: Secondary | ICD-10-CM | POA: Diagnosis not present

## 2021-10-12 DIAGNOSIS — N179 Acute kidney failure, unspecified: Secondary | ICD-10-CM

## 2021-10-12 LAB — BASIC METABOLIC PANEL
Anion gap: 8 (ref 5–15)
BUN: 28 mg/dL — ABNORMAL HIGH (ref 8–23)
CO2: 22 mmol/L (ref 22–32)
Calcium: 8.2 mg/dL — ABNORMAL LOW (ref 8.9–10.3)
Chloride: 105 mmol/L (ref 98–111)
Creatinine, Ser: 1.21 mg/dL (ref 0.61–1.24)
GFR, Estimated: 55 mL/min — ABNORMAL LOW (ref >60)
Glucose, Bld: 111 mg/dL — ABNORMAL HIGH (ref 70–99)
Potassium: 4 mmol/L (ref 3.5–5.1)
Sodium: 135 mmol/L (ref 135–145)

## 2021-10-12 LAB — CBC
HCT: 29.4 % — ABNORMAL LOW (ref 39.0–52.0)
Hemoglobin: 9.9 g/dL — ABNORMAL LOW (ref 13.0–17.0)
MCH: 33.3 pg (ref 26.0–34.0)
MCHC: 33.7 g/dL (ref 30.0–36.0)
MCV: 99 fL (ref 80.0–100.0)
Platelets: 63 10*3/uL — ABNORMAL LOW (ref 150–400)
RBC: 2.97 MIL/uL — ABNORMAL LOW (ref 4.22–5.81)
RDW: 15.2 % (ref 11.5–15.5)
WBC: 9 10*3/uL (ref 4.0–10.5)
nRBC: 0 % (ref 0.0–0.2)

## 2021-10-12 LAB — PROTIME-INR
INR: 2.1 — ABNORMAL HIGH (ref 0.8–1.2)
Prothrombin Time: 23.4 seconds — ABNORMAL HIGH (ref 11.4–15.2)

## 2021-10-12 LAB — PROCALCITONIN: Procalcitonin: 1.5 ng/mL

## 2021-10-12 MED ORDER — WARFARIN SODIUM 5 MG PO TABS
7.5000 mg | ORAL_TABLET | Freq: Once | ORAL | Status: AC
  Administered 2021-10-12: 7.5 mg via ORAL
  Filled 2021-10-12: qty 1

## 2021-10-12 MED ORDER — INFLUENZA VAC A&B SA ADJ QUAD 0.5 ML IM PRSY
0.5000 mL | PREFILLED_SYRINGE | INTRAMUSCULAR | Status: AC
  Administered 2021-10-13: 0.5 mL via INTRAMUSCULAR
  Filled 2021-10-12: qty 0.5

## 2021-10-12 MED ORDER — FUROSEMIDE 40 MG PO TABS
40.0000 mg | ORAL_TABLET | Freq: Every day | ORAL | Status: DC
  Administered 2021-10-12 – 2021-10-15 (×4): 40 mg via ORAL
  Filled 2021-10-12 (×4): qty 1

## 2021-10-12 NOTE — Progress Notes (Signed)
[place] for Warfarin Indication: atrial fibrillation  Allergies  Allergen Reactions   Sulfa Antibiotics Other (See Comments)    Weakness, dizziness   Primidone     Other reaction(s): increased sedation   Simvastatin     Other reaction(s): arthralgias and fatigue, severe   Patient Measurements: Height: 5' 9.5" (176.5 cm) Weight: 102.8 kg (226 lb 10.1 oz) IBW/kg (Calculated) : 71.85  Vital Signs: Temp: 98.7 F (37.1 C) (10/14 0507) Temp Source: Oral (10/14 0507) BP: 134/68 (10/14 0507) Pulse Rate: 70 (10/14 0507)  Labs: Recent Labs    10/10/21 0252 10/11/21 0443 10/12/21 0449  HGB 10.5* 10.7* 9.9*  HCT 31.9* 31.9* 29.4*  PLT 80* 68* 63*  LABPROT 37.7* 28.9* 23.4*  INR 3.8* 2.7* 2.1*  CREATININE 1.76* 1.40* 1.21    Estimated Creatinine Clearance: 44.5 mL/min (by C-G formula based on SCr of 1.21 mg/dL).  Medications: PTA Warfarin  Assessment: 87 yr male admitted with suspected sepsis secondary to UTI with a history of ESBL infections.  PMH significant for chronic dCHF, PAF, CVS, CKD and thrombocytopenia Patient on warfarin PTA for Afib at a reported dose of 7.5mg  daily except 10mg  on Fridays, last dose PTA reported as being taken 10/10 at 1830  10/12/21 INR 2.1 today - low therapeutic range (Warfarin held 10/12 for INR 3.8) CBC ok No reported bleeding   Goal of Therapy:  INR 2-3   Plan:  Warfarin 7.5 mg today at 1000 Daily PT/INR  Minda Ditto PharmD [facility] [phone] 10/12/2021, 7:00 AM

## 2021-10-12 NOTE — TOC Initial Note (Addendum)
Transition of Care [facility]) - Initial/Assessment Note    Patient Details  Name: Douglas Walker MRN: [id number] Date of Birth: [birth year]  Transition [facility]) CM/SW Contact:    Trish Mage, LCSW Phone Number: 10/12/2021, 11:54 AM  Clinical Narrative:   Patient seen in follow up to PT/OT recommendation of SNF.  Spoke with patient, who deferred to son, daughter-in-law, with whom he resides.  Spoke with both while in room with patient.  He has his own area in the house, joins them for meals, previously was ambulatory and taking care of all ADL's.  They would prefer to him to return home where they would care for him if he progresses beyond current state, but if he remains weak, would need to go to SNF due to safety concerns. Bed search initiated. TOC will continue to follow during the course of hospitalization.  Addendum:  Family chooses [facility] if he goes to SNF.                 Expected Discharge Plan: Skilled Nursing Facility Barriers to Discharge: SNF Pending bed offer   Patient Goals and CMS Choice     Choice offered to / list presented to : Patient, Adult Children  Expected Discharge Plan and Services Expected Discharge Plan: [place]   Discharge Planning Services: CM Consult Post Acute Care Choice: [place] Living arrangements for the past 2 months: Single Family Home                                      Prior Living Arrangements/Services Living arrangements for the past 2 months: Single Family Home Lives with:: Adult Children Patient language and need for interpreter reviewed:: Yes        Need for Family Participation in Patient Care: Yes (Comment) Care giver support system in place?: Yes (comment)   Criminal Activity/Legal Involvement Pertinent to Current Situation/Hospitalization: No - Comment as needed  Activities of Daily Living Home Assistive Devices/Equipment: Cane (specify quad or straight), Dentures  (specify type), Eyeglasses, Grab bars in shower, Hand-held shower hose, Shower chair without back, Environmental consultant (specify type), Wheelchair, Other (Comment) (walk-in shower, single point cane, front wheeled walker, handicap height toilet, upper/lower dentures, foley catheter) ADL Screening (condition at time of admission) Patient's cognitive ability adequate to safely complete daily activities?: Yes Is the patient deaf or have difficulty hearing?: Yes (Very HOH) Does the patient have difficulty seeing, even when wearing glasses/contacts?: Yes Does the patient have difficulty concentrating, remembering, or making decisions?: Yes Patient able to express need for assistance with ADLs?: Yes Does the patient have difficulty dressing or bathing?: Yes Independently performs ADLs?: No Communication: Independent Dressing (OT): Needs assistance Is this a change from baseline?: Change from baseline, expected to last >3 days Grooming: Needs assistance Is this a change from baseline?: Change from baseline, expected to last >3 days Feeding: Needs assistance Is this a change from baseline?: Change from baseline, expected to last >3 days Bathing: Needs assistance Is this a change from baseline?: Change from baseline, expected to last >3 days Toileting: Needs assistance (usually independent with device) Is this a change from baseline?: Change from baseline, expected to last >3days In/Out Bed: Needs assistance Is this a change from baseline?: Change from baseline, expected to last >3 days Walks in Home: Needs assistance (usually independent with his walker) Is this a change from baseline?: Change from baseline,  expected to last >3 days Does the patient have difficulty walking or climbing stairs?: Yes (secondary to wekaness) Weakness of Legs: Both Weakness of Arms/Hands: None  Permission Sought/Granted Permission sought to share information with : Family Supports Permission granted to share information with :  Yes, Verbal Permission Granted  Share Information with NAME: Miloh, Alcocer (Son)   [phone]           Emotional Assessment Appearance:: Appears stated age Attitude/Demeanor/Rapport: Unable to Assess Affect (typically observed): Unable to Assess Orientation: : Oriented to Self, Oriented to Situation Alcohol / Substance Use: Not Applicable Psych Involvement: No (comment)  Admission diagnosis:  Other specified hypotension [I95.89] Sepsis secondary to UTI ([place]) [A41.9, N39.0] Fever in other diseases [R50.81] Patient Active Problem List   Diagnosis Date Noted   Bacteremia due to Proteus species 10/11/2021   Sepsis secondary to UTI ([place]) 10/09/2021   AKI (acute kidney injury) ([place]) 07/06/2021   UTI (urinary tract infection) 07/02/2021   Pain in both testicles    Right hemiparesis ([place]) 04/23/2021   Cerebrovascular accident (CVA) due to embolism of left middle cerebral artery ([place]) 04/23/2021   Neuropathy 04/23/2021   Abnormality of gait 04/23/2021   Abnormal results of thyroid function studies 04/04/2021   Acquired iron deficiency anemia due to decreased absorption 04/04/2021   Acquired thrombophilia ([place]) 04/04/2021   Cerebral atherosclerosis 04/04/2021   Basal cell carcinoma of nose 04/04/2021   Benign hypertensive heart and renal disease, with heart and renal failure ([place]) 04/04/2021   Benign prostatic hyperplasia 04/04/2021   Obesity 04/04/2021   Bradycardia 04/04/2021   Closed fracture of one rib 04/04/2021   Constipation by delayed colonic transit 04/04/2021   Fatigue 04/04/2021   Gastro-esophageal reflux disease without esophagitis 04/04/2021   Hypothyroidism 04/04/2021   Left lower quadrant pain 04/04/2021   Lumbar radiculopathy 04/04/2021   Reactive depression 04/04/2021   Malignant neoplasm of anterior wall of urinary bladder ([place]) 04/04/2021   Mass of chest wall 04/04/2021   Mixed hyperlipidemia 04/04/2021   Nocturia 04/04/2021   Other shoulder lesions, right  shoulder 04/04/2021   Pain in limb 04/04/2021   Peripheral edema 04/04/2021   Personal history of malignant neoplasm of bladder 04/04/2021   Pruritus of genitalia 04/04/2021   Tremor 04/04/2021   Unilateral primary osteoarthritis, left knee 04/04/2021   Unspecified mononeuropathy of unspecified lower limb 04/04/2021   Urinary incontinence 04/04/2021   Dysphagia, post-stroke    Chronic diastolic congestive heart failure (HCC)    Chronic pain syndrome    Left middle cerebral artery stroke ([place]) 02/01/2021   Acute CVA (cerebrovascular accident) ([place]) 01/28/2021   Pacemaker 08/30/2020   Hematoma, chest wall 07/24/2020   Exudative age-related macular degeneration of right eye with inactive choroidal neovascularization ([place]) 05/10/2020   Exudative age-related macular degeneration of left eye with inactive choroidal neovascularization ([place]) 05/10/2020   Retinal hemorrhage of right eye 05/10/2020   Exposure keratopathy, bilateral 05/10/2020   Bilateral dry eyes 05/10/2020   Advanced nonexudative age-related macular degeneration of both eyes with subfoveal involvement 05/10/2020   Sick sinus syndrome ([place]) 12/06/2018   Complete heart block (HCC) 12/04/2018   Chronic low back pain 12/15/2017   Hereditary and idiopathic peripheral neuropathy 07/01/2016   Essential tremor 02/28/2016   BPPV (benign paroxysmal positional vertigo) 06/19/2015   Dizziness 06/18/2015   Bacteria in urine 06/18/2015   CKD (chronic kidney disease) stage 3, GFR 30-59 ml/min: Probable 06/18/2015   Chronic diastolic CHF (congestive heart failure) ([place]) 05/30/2015   Fall  Syncope 05/19/2015   Lower urinary tract infectious disease 05/19/2015   Hypotension 05/19/2015   Dehydration 05/19/2015   Fracture of rib of left side 05/19/2015   Syncope and collapse 05/19/2015   Mobitz type II atrioventricular block 09/13/2014   Essential hypertension 09/13/2014   Chronic anticoagulation 09/13/2014   Tachycardia-bradycardia  syndrome ([place]) 09/13/2014   Paroxysmal atrial fibrillation ([place]) 10/28/2013   Thrombocytopenia ([place]) 04/06/2013   PCP:  Josetta Huddle, MD Pharmacy:   [facility] [place] - [place] [address] [place] [address] Phone: [phone] Fax: [phone]     Social Determinants of Health ([place]) Interventions    Readmission Risk Interventions Readmission Risk Prevention Plan 07/26/2020  Transportation Screening Complete  PCP or Specialist Appt within 5-7 Days Not Complete  Not Complete comments pending disposition  Home Care Screening Complete  Medication Review (RN CM) Referral to Pharmacy  Some recent data might be hidden

## 2021-10-12 NOTE — Progress Notes (Signed)
Occupational Therapy Treatment Patient Details Name: Douglas Walker MRN: [id number] DOB: 15-Aug-1925 Today's Date: 10/12/2021   History of present illness Douglas Walker is a 87 y.o. male with a history of sick sinus syndrome with pacer, paroxysmal atrial fibrillation on warfarin, history of CVA, chronic diastolic CHF, Foley catheter, and recurrent UTI with ESBL. Patient presented secondary to chills and confusion with concern for UTI/sepsi   OT comments  Patient transferred to side of bed with min assist to guide lower extremities to side of bed and min assist to stand. Patient able to ambulate to bathroom with min assist for walker management (patient has very low vision). Patient still needing total assist for toileting but patient able to stand and stabilize with walker during pericare. On RA o2 sat dropped to 89% after ambulating to toilet but recovered with deep breathing. After toilet and return to reclilner patient's o2 sat 83%. Required 2 L to return to above 90%. Patient making excellent progress.    Recommendations for follow up therapy are one component of a multi-disciplinary discharge planning process, led by the attending physician.  Recommendations may be updated based on patient status, additional functional criteria and insurance authorization.    Follow Up Recommendations  SNF    Equipment Recommendations  None recommended by OT    Recommendations for Other Services      Precautions / Restrictions Precautions Precautions: Fall Precaution Comments: HOH and very low vision Restrictions Weight Bearing Restrictions: No       Mobility Bed Mobility Overal bed mobility: Needs Assistance Bed Mobility: Supine to Sit     Supine to sit: Min assist     General bed mobility comments: min assist to inititate legs toward the edge of the bed (assistance needed for direction) but then patient able to transfer to side of bed.    Transfers Overall transfer level:  Needs assistance Equipment used: Rolling walker (2 wheeled) Transfers: Sit to/from Stand Sit to Stand: Min assist;From elevated surface         General transfer comment: Min assist from bed and toilet to stand with RW. [place] able to ambulate with min assist (for walker management to bathroom and back out to recliner.)    Balance Overall balance assessment: Needs assistance Sitting-balance support: No upper extremity supported Sitting balance-Leahy Scale: Good     Standing balance support: During functional activity;Bilateral upper extremity supported Standing balance-Leahy Scale: Poor Standing balance comment: reliant on walker                           ADL either performed or assessed with clinical judgement   ADL Overall ADL's : Needs assistance/impaired                         Toilet Transfer: Minimal assistance;+2 for safety/equipment;RW;BSC;Regular Toilet Toilet Transfer Details (indicate cue type and reason): Patient ambulated to bathroom with +2 for safety. Therapist managing patient's walker due to poor vision and min assist for sit/stand transfer. Second person to assist wtih IV pole. Toileting- Clothing Manipulation and Hygiene: Total assistance;Sit to/from stand Toileting - Clothing Manipulation Details (indicate cue type and reason): Total assist for tioleting. But patient able to assist with sit to stand and steady himself on walker       General ADL Comments: Patient able to lean over and assist with pulling socks up. Could not don socks.     Vision Baseline Vision/History: 6  Macular Degeneration Patient Visual Report: No change from baseline     Perception     Praxis      Cognition Arousal/Alertness: Awake/alert Behavior During Therapy: WFL for tasks assessed/performed Overall Cognitive Status: Within Functional Limits for tasks assessed                                          Exercises     Shoulder  Instructions       General Comments      Pertinent Vitals/ Pain       Pain Assessment: No/denies pain  Home Living                                          Prior Functioning/Environment              Frequency  Min 2X/week        Progress Toward Goals  OT Goals(current goals can now be found in the care plan section)  Progress towards OT goals: Progressing toward goals  Acute Rehab OT Goals Patient Stated Goal: none stated OT Goal Formulation: With patient Time For Goal Achievement: 10/24/21 Potential to Achieve Goals: Good  Plan Discharge plan remains appropriate    Co-evaluation                 AM-PAC OT "6 Clicks" Daily Activity     Outcome Measure   Help from another person eating meals?: A Little Help from another person taking care of personal grooming?: A Little Help from another person toileting, which includes using toliet, bedpan, or urinal?: Total Help from another person bathing (including washing, rinsing, drying)?: A Lot Help from another person to put on and taking off regular upper body clothing?: A Lot Help from another person to put on and taking off regular lower body clothing?: A Lot 6 Click Score: 13    End of Session Equipment Utilized During Treatment: Rolling walker;Gait belt  OT Visit Diagnosis: Unsteadiness on feet (R26.81);Muscle weakness (generalized) (M62.81)   Activity Tolerance Patient tolerated treatment well   Patient Left in chair;with call bell/phone within reach;with chair alarm set   Nurse Communication Mobility status        Time: 4098-1191 OT Time Calculation (min): 33 min  Charges: OT General Charges $OT Visit: 1 Visit OT Treatments $Self Care/Home Management : 23-37 mins  Jamerion Cabello, OTR/L [place]  Office [phone] Pager: [place] 10/12/2021, 1:54 PM

## 2021-10-12 NOTE — Progress Notes (Signed)
Physical Therapy Treatment Patient Details Name: Douglas Walker MRN: [id number] DOB: [birth year] Today's Date: 10/12/2021   History of Present Illness KAMALI NEPHEW is a 87 y.o. male admitted with  UTI/sepsis on 10/08/21.  Pt with a history of sick sinus syndrome with pacer, paroxysmal atrial fibrillation on warfarin, history of CVA, chronic diastolic CHF, Foley catheter, and recurrent UTI with ESBL.    PT Comments    Pt making good progress today but still fatigues easily and requiring min-mod A for transfers/gait.  Continue to recommend SNF.     Recommendations for follow up therapy are one component of a multi-disciplinary discharge planning process, led by the attending physician.  Recommendations may be updated based on patient status, additional functional criteria and insurance authorization.  Follow Up Recommendations  SNF     Equipment Recommendations  None recommended by PT    Recommendations for Other Services       Precautions / Restrictions Precautions Precautions: Fall Precaution Comments: HOH and very low vision Restrictions Weight Bearing Restrictions: No     Mobility  Bed Mobility Overal bed mobility: Needs Assistance Bed Mobility: Sit to Supine     Supine to sit: Min assist Sit to supine: Mod assist   General bed mobility comments: Increased time, cues to sequence, assist with legs and to readjust trunk    Transfers Overall transfer level: Needs assistance Equipment used: Rolling walker (2 wheeled) Transfers: Sit to/from Stand Sit to Stand: Min assist;From elevated surface         General transfer comment: Min A to rise with cues for hand placement and to steady  Ambulation/Gait Ambulation/Gait assistance: Min assist Gait Distance (Feet): 80 Feet Assistive device: Rolling walker (2 wheeled) Gait Pattern/deviations: Step-to pattern;Decreased stride length;Shuffle Gait velocity: decreased   General Gait Details: Fatigued easily, 1  standing rest break, cues and assist for [employer]             Wheelchair Mobility    Modified Rankin (Stroke Patients Only)       Balance Overall balance assessment: Needs assistance Sitting-balance support: No upper extremity supported Sitting balance-Leahy Scale: Good     Standing balance support: During functional activity;Bilateral upper extremity supported Standing balance-Leahy Scale: Poor Standing balance comment: Requiring RW and min A at times                            Cognition Arousal/Alertness: Awake/alert Behavior During Therapy: WFL for tasks assessed/performed Overall Cognitive Status: Within Functional Limits for tasks assessed                                 General Comments: patient is able to follow simple commands when spoken loud. patient is very Dietitian Comments General comments (skin integrity, edema, etc.): Declined further exercise due to fatigue      Pertinent Vitals/Pain Pain Assessment: No/denies pain    Home Living                      Prior Function            PT Goals (current goals can now be found in the care plan section) Acute Rehab PT Goals Patient Stated Goal: none stated Progress towards PT goals: Progressing toward goals    Frequency  Min 2X/week      PT Plan Current plan remains appropriate    Co-evaluation              AM-PAC PT "6 Clicks" Mobility   Outcome Measure  Help needed turning from your back to your side while in a flat bed without using bedrails?: A Lot Help needed moving from lying on your back to sitting on the side of a flat bed without using bedrails?: A Lot Help needed moving to and from a bed to a chair (including a wheelchair)?: A Little Help needed standing up from a chair using your arms (e.g., wheelchair or bedside chair)?: A Little Help needed to walk in hospital room?: A Little Help needed climbing 3-5  steps with a railing? : Total 6 Click Score: 14    End of Session Equipment Utilized During Treatment: Gait belt Activity Tolerance: Patient limited by fatigue Patient left: with call bell/phone within reach;in bed;with bed alarm set Nurse Communication: Mobility status PT Visit Diagnosis: Unsteadiness on feet (R26.81);Difficulty in walking, not elsewhere classified (R26.2)     Time: 1355-1415 PT Time Calculation (min) (ACUTE ONLY): 20 min  Charges:  $Gait Training: 8-22 mins                     Abran Richard, PT Acute Rehab Services Pager [phone] [facility] [place] 10/12/2021, 3:25 PM

## 2021-10-12 NOTE — NC FL2 (Signed)
[place] LEVEL OF CARE SCREENING TOOL     IDENTIFICATION  Patient Name: Douglas Walker Birthdate: January 17, 1925 Sex: male Admission Date (Current Location): 10/08/2021  [facility] and [place] Number:  Herbalist and Address:  [facility],  [place], [place]      Provider Number: [id number]  Attending Physician Name and Address:  Mariel Aloe, MD  Relative Name and Phone Number:  Gevin, [place])   [phone]    Current Level of Care: Hospital Recommended Level of Care: [place] Prior Approval Number:    Date Approved/Denied:   PASRR Number: [id number]  Discharge Plan: SNF    Current Diagnoses: Patient Active Problem List   Diagnosis Date Noted   Bacteremia due to Proteus species 10/11/2021   Sepsis secondary to UTI ([place]) 10/09/2021   AKI (acute kidney injury) ([place]) 07/06/2021   UTI (urinary tract infection) 07/02/2021   Pain in both testicles    Right hemiparesis ([place]) 04/23/2021   Cerebrovascular accident (CVA) due to embolism of left middle cerebral artery ([place]) 04/23/2021   Neuropathy 04/23/2021   Abnormality of gait 04/23/2021   Abnormal results of thyroid function studies 04/04/2021   Acquired iron deficiency anemia due to decreased absorption 04/04/2021   Acquired thrombophilia ([place]) 04/04/2021   Cerebral atherosclerosis 04/04/2021   Basal cell carcinoma of nose 04/04/2021   Benign hypertensive heart and renal disease, with heart and renal failure ([place]) 04/04/2021   Benign prostatic hyperplasia 04/04/2021   Obesity 04/04/2021   Bradycardia 04/04/2021   Closed fracture of one rib 04/04/2021   Constipation by delayed colonic transit 04/04/2021   Fatigue 04/04/2021   Gastro-esophageal reflux disease without esophagitis 04/04/2021   Hypothyroidism 04/04/2021   Left lower quadrant pain 04/04/2021   Lumbar radiculopathy 04/04/2021   Reactive depression 04/04/2021   Malignant  neoplasm of anterior wall of urinary bladder ([place]) 04/04/2021   Mass of chest wall 04/04/2021   Mixed hyperlipidemia 04/04/2021   Nocturia 04/04/2021   Other shoulder lesions, right shoulder 04/04/2021   Pain in limb 04/04/2021   Peripheral edema 04/04/2021   Personal history of malignant neoplasm of bladder 04/04/2021   Pruritus of genitalia 04/04/2021   Tremor 04/04/2021   Unilateral primary osteoarthritis, left knee 04/04/2021   Unspecified mononeuropathy of unspecified lower limb 04/04/2021   Urinary incontinence 04/04/2021   Dysphagia, post-stroke    Chronic diastolic congestive heart failure (HCC)    Chronic pain syndrome    Left middle cerebral artery stroke ([place]) 02/01/2021   Acute CVA (cerebrovascular accident) ([place]) 01/28/2021   Pacemaker 08/30/2020   Hematoma, chest wall 07/24/2020   Exudative age-related macular degeneration of right eye with inactive choroidal neovascularization ([place]) 05/10/2020   Exudative age-related macular degeneration of left eye with inactive choroidal neovascularization ([place]) 05/10/2020   Retinal hemorrhage of right eye 05/10/2020   Exposure keratopathy, bilateral 05/10/2020   Bilateral dry eyes 05/10/2020   Advanced nonexudative age-related macular degeneration of both eyes with subfoveal involvement 05/10/2020   Sick sinus syndrome ([place]) 12/06/2018   Complete heart block (HCC) 12/04/2018   Chronic low back pain 12/15/2017   Hereditary and idiopathic peripheral neuropathy 07/01/2016   Essential tremor 02/28/2016   BPPV (benign paroxysmal positional vertigo) 06/19/2015   Dizziness 06/18/2015   Bacteria in urine 06/18/2015   CKD (chronic kidney disease) stage 3, GFR 30-59 ml/min: Probable 06/18/2015   Chronic diastolic CHF (congestive heart failure) ([place]) 05/30/2015   Fall    Syncope 05/19/2015  Lower urinary tract infectious disease 05/19/2015   Hypotension 05/19/2015   Dehydration 05/19/2015   Fracture of rib of left side 05/19/2015    Syncope and collapse 05/19/2015   Mobitz type II atrioventricular block 09/13/2014   Essential hypertension 09/13/2014   Chronic anticoagulation 09/13/2014   Tachycardia-bradycardia syndrome ([place]) 09/13/2014   Paroxysmal atrial fibrillation ([place]) 10/28/2013   Thrombocytopenia ([place]) 04/06/2013    Orientation RESPIRATION BLADDER Height & Weight     Self, Situation  O2 (2L [place]) Indwelling catheter Weight: 102.8 kg Height:  5' 9.5" (176.5 cm)  BEHAVIORAL SYMPTOMS/MOOD NEUROLOGICAL BOWEL NUTRITION STATUS      Continent Diet (see d/c summary)  AMBULATORY STATUS COMMUNICATION OF NEEDS Skin   Extensive Assist Verbally Normal                       Personal Care Assistance Level of Assistance  Bathing, Feeding, Dressing Bathing Assistance: Maximum assistance Feeding assistance: Limited assistance Dressing Assistance: Maximum assistance     Functional Limitations Info  Sight, Hearing, Speech Sight Info: Adequate Hearing Info: Impaired (corrected with hearing aids) Speech Info: Adequate    SPECIAL CARE FACTORS FREQUENCY  PT (By licensed PT), OT (By licensed OT)     PT Frequency: 5X/W OT Frequency: 5X/W            Contractures      Additional Factors Info  Code Status, Allergies Code Status Info: DNR Allergies Info: Sulfa Antibiotics, Primidone, Simvastatin           Current Medications (10/12/2021):  This is the current hospital active medication list Current Facility-Administered Medications  Medication Dose Route Frequency Provider Last Rate Last Admin   acetaminophen (TYLENOL) tablet 650 mg  650 mg Oral Q6H PRN Opyd, Ilene Qua, MD   650 mg at 10/12/21 0857   Or   acetaminophen (TYLENOL) suppository 650 mg  650 mg Rectal Q6H PRN Opyd, Ilene Qua, MD       ampicillin (OMNIPEN) 2 g in sodium chloride 0.9 % 100 mL IVPB  2 g Intravenous Q6H Green, Terri L, RPH 300 mL/hr at 10/12/21 1118 2 g at 10/12/21 1118   Chlorhexidine Gluconate Cloth 2 % PADS 6 each  6 each  Topical Daily Opyd, Ilene Qua, MD   6 each at 10/12/21 0858   DULoxetine (CYMBALTA) DR capsule 60 mg  60 mg Oral Daily Mariel Aloe, MD   60 mg at 10/12/21 0858   furosemide (LASIX) tablet 40 mg  40 mg Oral Daily Mariel Aloe, MD   40 mg at 10/12/21 1121   levothyroxine (SYNTHROID) tablet 25 mcg  25 mcg Oral Q0600 Mariel Aloe, MD   25 mcg at 10/12/21 0511   ondansetron (ZOFRAN) tablet 4 mg  4 mg Oral Q6H PRN Opyd, Ilene Qua, MD       Or   ondansetron (ZOFRAN) injection 4 mg  4 mg Intravenous Q6H PRN Opyd, Ilene Qua, MD   4 mg at 10/11/21 1855   phenol (CHLORASEPTIC) mouth spray 1 spray  1 spray Mouth/Throat PRN Opyd, Ilene Qua, MD   1 spray at 10/10/21 0312   polyethylene glycol (MIRALAX / GLYCOLAX) packet 17 g  17 g Oral Daily Mariel Aloe, MD   17 g at 10/12/21 0858   pregabalin (LYRICA) capsule 25 mg  25 mg Oral QHS Mariel Aloe, MD   25 mg at 10/11/21 2142   topiramate (TOPAMAX) tablet 50 mg  50 mg Oral BID  Mariel Aloe, MD   50 mg at 10/12/21 3668   Warfarin - Pharmacist Dosing Inpatient   Does not apply [id number] Nilda Simmer [facility]   Given at 10/11/21 1651     Discharge Medications: Please see discharge summary for a list of discharge medications.  Relevant Imaging Results:  Relevant Lab Results:   Additional Information SSN: [place], [place]

## 2021-10-12 NOTE — Progress Notes (Signed)
PROGRESS NOTE    Douglas Walker  GYB:[id number] DOB: 1925-10-29 DOA: 10/08/2021 PCP: Josetta Huddle, MD   Brief Narrative: Douglas Walker is a 87 y.o. male with a history of sick sinus syndrome with pacer, paroxysmal atrial fibrillation on warfarin, history of CVA, chronic diastolic CHF, Foley catheter, and recurrent UTI with ESBL. Patient presented secondary to chills and confusion with concern for UTI/sepsis. Empiric antibiotics initiated and cultures obtained with urine culture significant for proteus mirabilis. Blood culture pending.   Assessment & Plan:   Principal Problem:   Bacteremia due to Proteus species Active Problems:   Thrombocytopenia (HCC)   Paroxysmal atrial fibrillation (HCC)   CKD (chronic kidney disease) stage 3, GFR 30-59 ml/min: Probable   Chronic diastolic congestive heart failure (HCC)   Benign prostatic hyperplasia   Cerebrovascular accident (CVA) due to embolism of left middle cerebral artery ([place])   Sepsis secondary to UTI ([place])   Severe sepsis Patient on admission with a Tmax of 39.2 C with associated WBC of 19,600 and lactic acid of 2.1. Secondary to UTI/pyelonephritis. Meropenem IV started empirically for history of ESBL infection. Urine and blood cultures obtained on admission and are significant for proteus mirabilis. Meropenem transitioned to ampicillin IV. Procalcitonin trending down. WBC trending down.  CAUTI/Pyelonephritis Bacteremia Present on admission. Foley catheter exchanged this admission. Cultures significant for proteus mirabilis. No flank pain. No recurrent/persistent fevers. Urine culture sensitivities not consistent with ESBL; blood culture sensitivities pending. No symptoms concerning for abscess formation at this time. Fever yesterday afternoon. -Continue ampicillin IV -ID recommendations: continue ampicillin IV  Paroxysmal atrial fibrillation In sinus rhythm. Patient is on warfarin as an outpatient.  Chronic diastolic  heart failure Stable. EF of 50-55% in January 2022. Patient is on Lasix as an outpatient which was held on admission secondary to sepsis. Weight is pending today -Resume lasix today -Strict in and out and daily weights  History of CVA -Continue Warfarin  AKI on CKD stage IIIb Baseline creatinine of about 1.3-1.4. Creatinine of 1.51 on admission with worsening up to peak of 1.77. In setting of hypotension. Resolved.  Chronic foley catheter BPH Patient follows with Urology. He is on nitrofurantoin, finasteride/tamsulosin (recently discontinued) as an outpatient. Current infection is resistant to nitrofurantoin.  Constipation -Miralax, Senokot  Acute respiratory failure with hypoxia Hypoxic. No symptoms noted.  -Wean to room air  Hypothyroidism -Continue home Synthroid 25 mcg daily  Thrombocytopenia Chronic. Stable.  Essential tremor Previously diagnosed. Has been evaluated by neurology.  Sick sinus syndrome S/p pacemaker   DVT prophylaxis: Warfarin Code Status:   Code Status: DNR Family Communication: Son and daughter-in-law via telephone (26 minutes) Disposition Plan: Discharge home vs SNF likely in 2-4 days pending culture results, possible transition to oral antibiotics, PT/OT eval, ID recommendations   Consultants:  Infectious disease  Procedures:  None  Antimicrobials: Meropenem IV  Ampicillin IV   Subjective: No concerns today. Feels well. No bowel movement.  Objective: Vitals:   10/11/21 1656 10/11/21 1658 10/11/21 2043 10/12/21 0507  BP:   (!) 144/67 134/68  Pulse:   70 70  Resp:   (!) 24 (!) 22  Temp:   99.4 F (37.4 C) 98.7 F (37.1 C)  TempSrc:   Oral Oral  SpO2: (!) 89% 92% 97% 91%  Weight:      Height:        Intake/Output Summary (Last 24 hours) at 10/12/2021 0927 Last data filed at 10/12/2021 0833 Gross per 24 hour  Intake 794 ml  Output 650 ml  Net 144 ml    Filed Weights   10/09/21 1315 10/10/21 0309 10/11/21 0441  Weight:  99.4 kg 99.5 kg 102.8 kg    Examination:  General exam: Appears calm and comfortable Respiratory system: Clear to auscultation. Respiratory effort normal. Cardiovascular system: S1 & S2 heard, RRR. No murmurs, rubs, gallops or clicks. Gastrointestinal system: Abdomen is nondistended, soft and mild abdominal tenderness. No organomegaly or masses felt. Normal bowel sounds heard. Central nervous system: Alert and oriented. No focal neurological deficits. Musculoskeletal: 1+ BLE edema. No calf tenderness Skin: No cyanosis. No rashes Psychiatry: Judgement and insight appear normal. Mood & affect appropriate.     Data Reviewed: I have personally reviewed following labs and imaging studies  CBC Lab Results  Component Value Date   WBC 9.0 10/12/2021   RBC 2.97 (L) 10/12/2021   HGB 9.9 (L) 10/12/2021   HCT 29.4 (L) 10/12/2021   MCV 99.0 10/12/2021   MCH 33.3 10/12/2021   PLT 63 (L) 10/12/2021   MCHC 33.7 10/12/2021   RDW 15.2 10/12/2021   LYMPHSABS 0.3 (L) 10/08/2021   MONOABS 0.5 10/08/2021   EOSABS 0.0 10/08/2021   BASOSABS 0.0 90/30/0923     Last metabolic panel Lab Results  Component Value Date   NA 135 10/12/2021   K 4.0 10/12/2021   CL 105 10/12/2021   CO2 22 10/12/2021   BUN 28 (H) 10/12/2021   CREATININE 1.21 10/12/2021   GLUCOSE 111 (H) 10/12/2021   GFRNONAA 55 (L) 10/12/2021   GFRAA 45 (L) 08/24/2020   CALCIUM 8.2 (L) 10/12/2021   PHOS 3.8 02/01/2021   PROT 5.7 (L) 10/09/2021   ALBUMIN 3.2 (L) 10/09/2021   BILITOT 0.8 10/09/2021   ALKPHOS 94 10/09/2021   AST 65 (H) 10/09/2021   ALT 58 (H) 10/09/2021   ANIONGAP 8 10/12/2021    CBG (last 3)  No results for input(s): GLUCAP in the last 72 hours.   GFR: Estimated Creatinine Clearance: 44.5 mL/min (by C-G formula based on SCr of 1.21 mg/dL).  Coagulation Profile: Recent Labs  Lab 10/08/21 2202 10/10/21 0252 10/11/21 0443 10/12/21 0449  INR 2.3* 3.8* 2.7* 2.1*     Recent Results (from the past  240 hour(s))  Resp Panel by RT-PCR (Flu A&B, Covid) Nasopharyngeal Swab     Status: None   Collection Time: 10/08/21 10:02 PM   Specimen: Nasopharyngeal Swab; Nasopharyngeal(NP) swabs in vial transport medium  Result Value Ref Range Status   SARS Coronavirus 2 by RT PCR NEGATIVE NEGATIVE Final    Comment: (NOTE) SARS-CoV-2 target nucleic acids are NOT DETECTED.  The SARS-CoV-2 RNA is generally detectable in upper respiratory specimens during the acute phase of infection. The lowest concentration of SARS-CoV-2 viral copies this assay can detect is 138 copies/mL. A negative result does not preclude SARS-Cov-2 infection and should not be used as the sole basis for treatment or other patient management decisions. A negative result may occur with  improper specimen collection/handling, submission of specimen other than nasopharyngeal swab, presence of viral mutation(s) within the areas targeted by this assay, and inadequate number of viral copies(<138 copies/mL). A negative result must be combined with clinical observations, patient history, and epidemiological information. The expected result is Negative.  Fact Sheet for Patients:  EntrepreneurPulse.com.au  Fact Sheet for Healthcare Providers:  IncredibleEmployment.be  This test is no t yet approved or cleared by the [place] FDA and  has been authorized for detection and/or diagnosis of SARS-CoV-2 by FDA under  an Emergency Use Authorization (EUA). This EUA will remain  in effect (meaning this test can be used) for the duration of the COVID-19 declaration under Section 564(b)(1) of the Act, 21 U.S.C.section 360bbb-3(b)(1), unless the authorization is terminated  or revoked sooner.       Influenza A by PCR NEGATIVE NEGATIVE Final   Influenza B by PCR NEGATIVE NEGATIVE Final    Comment: (NOTE) The Xpert Xpress SARS-CoV-2/FLU/RSV plus assay is intended as an aid in the diagnosis of influenza  from Nasopharyngeal swab specimens and should not be used as a sole basis for treatment. Nasal washings and aspirates are unacceptable for Xpert Xpress SARS-CoV-2/FLU/RSV testing.  Fact Sheet for Patients: EntrepreneurPulse.com.au  Fact Sheet for Healthcare Providers: IncredibleEmployment.be  This test is not yet approved or cleared by the [place] FDA and has been authorized for detection and/or diagnosis of SARS-CoV-2 by FDA under an Emergency Use Authorization (EUA). This EUA will remain in effect (meaning this test can be used) for the duration of the COVID-19 declaration under Section 564(b)(1) of the Act, 21 U.S.C. section 360bbb-3(b)(1), unless the authorization is terminated or revoked.  Performed at [facility], [place] [address], [place], [place] [address]   Blood culture (routine single)     Status: Abnormal (Preliminary result)   Collection Time: 10/08/21 10:11 PM   Specimen: BLOOD  Result Value Ref Range Status   Specimen Description   Final    BLOOD RIGHT ANTECUBITAL Performed at [facility] [address] [place], [place] [address]    Special Requests   Final    BOTTLES DRAWN AEROBIC AND ANAEROBIC Blood Culture adequate volume Performed at [facility] [address] [place], [place] [address]    Culture  Setup Time   Final    GRAM NEGATIVE RODS AEROBIC BOTTLE ONLY CRITICAL RESULT CALLED TO, READ BACK BY AND VERIFIED WITH: PHARMD LEIGHANN P. 10/10/21@2 :28 BY TW    Culture (A)  Final    PROTEUS MIRABILIS SUSCEPTIBILITIES TO FOLLOW Performed at [facility], [place] [address] [place], [place] [address]    Report Status PENDING  Incomplete  Blood Culture ID Panel (Reflexed)     Status: Abnormal   Collection Time: 10/08/21 10:11 PM  Result Value Ref Range Status   Enterococcus faecalis NOT DETECTED NOT DETECTED Final   Enterococcus Faecium NOT DETECTED  NOT DETECTED Final   Listeria monocytogenes NOT DETECTED NOT DETECTED Final   Staphylococcus species NOT DETECTED NOT DETECTED Final   Staphylococcus aureus (BCID) NOT DETECTED NOT DETECTED Final   Staphylococcus epidermidis NOT DETECTED NOT DETECTED Final   Staphylococcus lugdunensis NOT DETECTED NOT DETECTED Final   Streptococcus species NOT DETECTED NOT DETECTED Final   Streptococcus agalactiae NOT DETECTED NOT DETECTED Final   Streptococcus pneumoniae NOT DETECTED NOT DETECTED Final   Streptococcus pyogenes NOT DETECTED NOT DETECTED Final   A.calcoaceticus-baumannii NOT DETECTED NOT DETECTED Final   Bacteroides fragilis NOT DETECTED NOT DETECTED Final   Enterobacterales DETECTED (A) NOT DETECTED Final    Comment: Enterobacterales represent a large order of gram negative bacteria, not a single organism. CRITICAL RESULT CALLED TO, READ BACK BY AND VERIFIED WITH: PHARMD LEIGHANN P. 10/10/21@2 :28 BY TW    Enterobacter cloacae complex NOT DETECTED NOT DETECTED Final   Escherichia coli NOT DETECTED NOT DETECTED Final   Klebsiella aerogenes NOT DETECTED NOT DETECTED Final   Klebsiella oxytoca NOT DETECTED NOT DETECTED Final   Klebsiella pneumoniae NOT DETECTED NOT DETECTED Final  Proteus species DETECTED (A) NOT DETECTED Final    Comment: CRITICAL RESULT CALLED TO, READ BACK BY AND VERIFIED WITH: PHARMD LEIGHANN P. 10/10/21@2 :28 BY TW    Salmonella species NOT DETECTED NOT DETECTED Final   Serratia marcescens NOT DETECTED NOT DETECTED Final   Haemophilus influenzae NOT DETECTED NOT DETECTED Final   Neisseria meningitidis NOT DETECTED NOT DETECTED Final   Pseudomonas aeruginosa NOT DETECTED NOT DETECTED Final   Stenotrophomonas maltophilia NOT DETECTED NOT DETECTED Final   Candida albicans NOT DETECTED NOT DETECTED Final   Candida auris NOT DETECTED NOT DETECTED Final   Candida glabrata NOT DETECTED NOT DETECTED Final   Candida krusei NOT DETECTED NOT DETECTED Final   Candida  parapsilosis NOT DETECTED NOT DETECTED Final   Candida tropicalis NOT DETECTED NOT DETECTED Final   Cryptococcus neoformans/gattii NOT DETECTED NOT DETECTED Final   CTX-M ESBL NOT DETECTED NOT DETECTED Final   Carbapenem resistance IMP NOT DETECTED NOT DETECTED Final   Carbapenem resistance KPC NOT DETECTED NOT DETECTED Final   Carbapenem resistance NDM NOT DETECTED NOT DETECTED Final   Carbapenem resist OXA 48 LIKE NOT DETECTED NOT DETECTED Final   Carbapenem resistance VIM NOT DETECTED NOT DETECTED Final    Comment: Performed at [facility], [address] [place], [place] [address]  Urine Culture     Status: Abnormal   Collection Time: 10/08/21 10:22 PM   Specimen: In/Out Cath Urine  Result Value Ref Range Status   Specimen Description   Final    IN/OUT CATH URINE Performed at [facility], [place] [address] [place], [place] [address]    Special Requests   Final    NONE Performed at [facility], [place] [address], [place], [place] [address]    Culture 90,000 COLONIES/mL PROTEUS MIRABILIS (A)  Final   Report Status 10/10/2021 FINAL  Final   Organism ID, Bacteria PROTEUS MIRABILIS (A)  Final      Susceptibility   Proteus mirabilis - MIC*    AMPICILLIN <=2 SENSITIVE Sensitive     CEFAZOLIN <=4 SENSITIVE Sensitive     CEFEPIME <=0.12 SENSITIVE Sensitive     CEFTRIAXONE <=0.25 SENSITIVE Sensitive     CIPROFLOXACIN <=0.25 SENSITIVE Sensitive     GENTAMICIN <=1 SENSITIVE Sensitive     IMIPENEM 2 SENSITIVE Sensitive     NITROFURANTOIN 128 RESISTANT Resistant     TRIMETH/SULFA <=20 SENSITIVE Sensitive     AMPICILLIN/SULBACTAM <=2 SENSITIVE Sensitive     PIP/TAZO <=4 SENSITIVE Sensitive     * 90,000 COLONIES/mL PROTEUS MIRABILIS  MRSA Next Gen by PCR, Nasal     Status: None   Collection Time: 10/09/21  1:53 PM   Specimen: Nasal Mucosa; Nasal Swab  Result Value Ref Range Status   MRSA by PCR Next Gen NOT DETECTED NOT DETECTED Final     Comment: (NOTE) The GeneXpert MRSA Assay (FDA approved for NASAL specimens only), is one component of a comprehensive MRSA colonization surveillance program. It is not intended to diagnose MRSA infection nor to guide or monitor treatment for MRSA infections. Test performance is not FDA approved in patients less than 69 years old. Performed at [facility], [place] [address] [place], [place] [address]          Radiology Studies: No results found.      Scheduled Meds:  Chlorhexidine Gluconate Cloth  6 each Topical Daily   DULoxetine  60 mg Oral Daily   levothyroxine  25 mcg Oral  Q0600   polyethylene glycol  17 g Oral Daily   pregabalin  25 mg Oral QHS   topiramate  50 mg Oral BID   warfarin  7.5 mg Oral Once   Warfarin - Pharmacist Dosing Inpatient   Does not apply q1600   Continuous Infusions:  ampicillin (OMNIPEN) IV 2 g (10/12/21 0509)     LOS: 3 days     Cordelia Poche, MD [facility] 10/12/2021, 9:27 AM  If 7PM-7AM, please contact night-coverage [email or web address]

## 2021-10-12 NOTE — Care Management Important Message (Signed)
Important Message  Patient Details IM Letter placed in the Patients room. Name: Douglas Walker MRN: [id number] Date of Birth: [birth year]   Medicare Important Message Given:  Yes     [name] 10/12/2021, 11:34 AM

## 2021-10-12 NOTE — Progress Notes (Signed)
[facility] for [facility]ious Disease  Date of Admission:  10/08/2021           Reason for visit: Follow up on bacteremia  Current antibiotics: Ampicillin 10/12-present   Previous antibiotics: Meropenem 10/10-10/11 Ceftriaxone x 1 dose 10/10   ASSESSMENT:    87 y.o. male admitted with:  Proteus mirabilis bacteremia: Secondary to complicated urinary tract infection in the setting of chronic Foley catheter and BPH.  His catheter was exchanged upon admission and he has improved on antibiotics.  He has been febrile over the last 24 hours with a T-max of 102.5. Severe sepsis: Secondary to #1. AKI on CKD: Creatinine baseline 1.3-1.4.  Peaked at 1.77 this admission and down to 1.21 today. BPH with history of recurrent UTI and chronic Foley: Follows as an outpatient with urology and on Macrobid suppression per urology.  RECOMMENDATIONS:    Continue ampicillin for now. Routine lab monitoring. Follow-up susceptibilities from blood cultures. If he is afebrile for 24 to 48 hours, anticipate transitioning to oral therapy to complete antibiotic duration. Will follow. Dr West Bali available over the weekend as needed.   Principal Problem:   Bacteremia due to Proteus species Active Problems:   Thrombocytopenia (HCC)   Paroxysmal atrial fibrillation (HCC)   CKD (chronic kidney disease) stage 3, GFR 30-59 ml/min: Probable   Chronic diastolic congestive heart failure (HCC)   Benign prostatic hyperplasia   Cerebrovascular accident (CVA) due to embolism of left middle cerebral artery (HCC)   Sepsis secondary to UTI [facility])    MEDICATIONS:    Scheduled Meds:  Chlorhexidine Gluconate Cloth  6 each Topical Daily   DULoxetine  60 mg Oral Daily   levothyroxine  25 mcg Oral Q0600   polyethylene glycol  17 g Oral Daily   pregabalin  25 mg Oral QHS   topiramate  50 mg Oral BID   Warfarin - Pharmacist Dosing Inpatient   Does not apply q1600   Continuous Infusions:  ampicillin  (OMNIPEN) IV 2 g (10/12/21 0509)   PRN Meds:.acetaminophen **OR** acetaminophen, ondansetron **OR** ondansetron (ZOFRAN) IV, phenol  SUBJECTIVE:   24 hour events:  Tmax 102.5 at 1pm yesterday WBC normalized Creatinine 1.21  Blood cx susceptibilities pending Urine cx with sensitive proteus On Ampicillin.   No acute complaints.  Reported some emesis last night after dinner.  Not currently nauseous.  Complains of some abdominal pain.  No fevers or chills at this time.  Review of Systems  All other systems reviewed and are negative.    OBJECTIVE:   Blood pressure 134/68, pulse 70, temperature 98.7 F (37.1 C), temperature source Oral, resp. rate (!) 22, height 5' 9.5" (1.765 m), weight 102.8 kg, SpO2 91 %. Body mass index is 32.99 kg/m.  Physical Exam Constitutional:      General: He is not in acute distress.    Appearance: Normal appearance.  HENT:     Head: Normocephalic and atraumatic.  Eyes:     Extraocular Movements: Extraocular movements intact.     Conjunctiva/sclera: Conjunctivae normal.  Pulmonary:     Effort: Pulmonary effort is normal. No respiratory distress.     Comments: On nasal cannula  Abdominal:     General: There is no distension.     Palpations: Abdomen is soft.     Tenderness: There is abdominal tenderness. There is no guarding or rebound.  Skin:    General: Skin is warm and dry.     Findings: No rash.  Neurological:  General: No focal deficit present.     Mental Status: He is alert. Mental status is at baseline.     Comments: Tremor noted.  Psychiatric:        Mood and Affect: Mood normal.        Behavior: Behavior normal.     Lab Results: Lab Results  Component Value Date   WBC 9.0 10/12/2021   HGB 9.9 (L) 10/12/2021   HCT 29.4 (L) 10/12/2021   MCV 99.0 10/12/2021   PLT 63 (L) 10/12/2021    Lab Results  Component Value Date   NA 135 10/12/2021   K 4.0 10/12/2021   CO2 22 10/12/2021   GLUCOSE 111 (H) 10/12/2021   BUN 28 (H)  10/12/2021   CREATININE 1.21 10/12/2021   CALCIUM 8.2 (L) 10/12/2021   GFRNONAA 55 (L) 10/12/2021   GFRAA 45 (L) 08/24/2020    Lab Results  Component Value Date   ALT 58 (H) 10/09/2021   AST 65 (H) 10/09/2021   ALKPHOS 94 10/09/2021   BILITOT 0.8 10/09/2021    No results found for: CRP  No results found for: ESRSEDRATE   I have reviewed the micro and lab results in Epic.  Imaging: No results found.   Imaging independently reviewed in Epic.    Raynelle Highland for Infectious Disease [facility] [phone] pager 10/12/2021, 6:35 AM  I spent greater than 35 minutes with the patient including greater than 50% of time in face to face counsel of the patient and in coordination of their care.

## 2021-10-13 LAB — PROTIME-INR
INR: 2.6 — ABNORMAL HIGH (ref 0.8–1.2)
Prothrombin Time: 27.6 seconds — ABNORMAL HIGH (ref 11.4–15.2)

## 2021-10-13 MED ORDER — WARFARIN SODIUM 5 MG PO TABS
7.5000 mg | ORAL_TABLET | Freq: Once | ORAL | Status: AC
  Administered 2021-10-13: 7.5 mg via ORAL
  Filled 2021-10-13: qty 1

## 2021-10-13 MED ORDER — LORATADINE 10 MG PO TABS
10.0000 mg | ORAL_TABLET | Freq: Every day | ORAL | Status: DC
  Administered 2021-10-13 – 2021-10-15 (×3): 10 mg via ORAL
  Filled 2021-10-13 (×3): qty 1

## 2021-10-13 NOTE — Progress Notes (Signed)
PROGRESS NOTE    Douglas Walker  YPP:[id number] DOB: June 17, 1925 DOA: 10/08/2021 PCP: Josetta Huddle, MD   Brief Narrative: Douglas Walker is a 87 y.o. male with a history of sick sinus syndrome with pacer, paroxysmal atrial fibrillation on warfarin, history of CVA, chronic diastolic CHF, Foley catheter, and recurrent UTI with ESBL. Patient presented secondary to chills and confusion with concern for UTI/sepsis. Empiric antibiotics initiated and cultures obtained with urine culture significant for proteus mirabilis. Blood culture pending.   Assessment & Plan:   Principal Problem:   Bacteremia due to Proteus species Active Problems:   Thrombocytopenia (HCC)   Paroxysmal atrial fibrillation (HCC)   CKD (chronic kidney disease) stage 3, GFR 30-59 ml/min: Probable   Chronic diastolic congestive heart failure (HCC)   Benign prostatic hyperplasia   Cerebrovascular accident (CVA) due to embolism of left middle cerebral artery ([place])   Sepsis secondary to UTI ([place])   Severe sepsis Patient on admission with a Tmax of 39.2 C with associated WBC of 19,600 and lactic acid of 2.1. Secondary to UTI/pyelonephritis. Meropenem IV started empirically for history of ESBL infection. Urine and blood cultures obtained on admission and are significant for proteus mirabilis. Meropenem transitioned to ampicillin IV. Procalcitonin trending down. WBC trending down.  CAUTI/Pyelonephritis Bacteremia Present on admission. Foley catheter exchanged this admission. Cultures significant for proteus mirabilis. No flank pain. No recurrent/persistent fevers. Urine culture sensitivities not consistent with ESBL; blood culture sensitivities available. No symptoms concerning for abscess formation at this time. No fever overnight. -Continue ampicillin IV -ID recommendations: Continue ampicillin IV  Paroxysmal atrial fibrillation In sinus rhythm. Patient is on warfarin as an outpatient.  Chronic diastolic heart  failure Stable. EF of 50-55% in January 2022. Patient is on Lasix as an outpatient which was held on admission secondary to sepsis. Weight is pending today -Continue lasix today -Strict in and out and daily weights  History of CVA -Continue Warfarin  AKI on CKD stage IIIb Baseline creatinine of about 1.3-1.4. Creatinine of 1.51 on admission with worsening up to peak of 1.77. In setting of hypotension. Resolved.  Chronic foley catheter BPH Patient follows with Urology. He is on nitrofurantoin, finasteride/tamsulosin (recently discontinued) as an outpatient. Current infection is resistant to nitrofurantoin.  Constipation -Miralax, Senokot  Acute respiratory failure with hypoxia Hypoxic. No symptoms noted.  -Wean to room air  Hypothyroidism -Continue home Synthroid 25 mcg daily  Thrombocytopenia Chronic. Stable.  Essential tremor Previously diagnosed. Has been evaluated by neurology.  Sick sinus syndrome S/p pacemaker   DVT prophylaxis: Warfarin Code Status:   Code Status: DNR Family Communication: None at bedside Disposition Plan: Discharge SNF likely in 1- days pending ID recommendations   Consultants:  Infectious disease  Procedures:  None  Antimicrobials: Meropenem IV  Ampicillin IV   Subjective: No concerns today. Had a bowel movement.  Objective: Vitals:   10/12/21 0507 10/12/21 1445 10/12/21 2115 10/13/21 0453  BP: 134/68 126/66 129/72 (!) 147/78  Pulse: 70 70 70 70  Resp: (!) 22 16 16 16   Temp: 98.7 F (37.1 C) 98.6 F (37 C) 98.5 F (36.9 C) 98.6 F (37 C)  TempSrc: Oral Oral Oral Oral  SpO2: 91% 95% 96% 97%  Weight:    102.9 kg  Height:        Intake/Output Summary (Last 24 hours) at 10/13/2021 1214 Last data filed at 10/13/2021 1100 Gross per 24 hour  Intake 1185 ml  Output 1426 ml  Net -241 ml  Filed Weights   10/10/21 0309 10/11/21 0441 10/13/21 0453  Weight: 99.5 kg 102.8 kg 102.9 kg    Examination:  General exam:  Appears calm and comfortable Respiratory system: Clear to auscultation. Respiratory effort normal. Cardiovascular system: S1 & S2 heard, RRR. No murmurs, rubs, gallops or clicks. Gastrointestinal system: Abdomen is nondistended, soft and nontender. No organomegaly or masses felt. Normal bowel sounds heard. Central nervous system: Alert and oriented. No focal neurological deficits. Musculoskeletal: No calf tenderness Skin: No cyanosis. No rashes Psychiatry: Judgement and insight appear normal. Mood & affect appropriate.     Data Reviewed: I have personally reviewed following labs and imaging studies  CBC Lab Results  Component Value Date   WBC 9.0 10/12/2021   RBC 2.97 (L) 10/12/2021   HGB 9.9 (L) 10/12/2021   HCT 29.4 (L) 10/12/2021   MCV 99.0 10/12/2021   MCH 33.3 10/12/2021   PLT 63 (L) 10/12/2021   MCHC 33.7 10/12/2021   RDW 15.2 10/12/2021   LYMPHSABS 0.3 (L) 10/08/2021   MONOABS 0.5 10/08/2021   EOSABS 0.0 10/08/2021   BASOSABS 0.0 56/25/6389     Last metabolic panel Lab Results  Component Value Date   NA 135 10/12/2021   K 4.0 10/12/2021   CL 105 10/12/2021   CO2 22 10/12/2021   BUN 28 (H) 10/12/2021   CREATININE 1.21 10/12/2021   GLUCOSE 111 (H) 10/12/2021   GFRNONAA 55 (L) 10/12/2021   GFRAA 45 (L) 08/24/2020   CALCIUM 8.2 (L) 10/12/2021   PHOS 3.8 02/01/2021   PROT 5.7 (L) 10/09/2021   ALBUMIN 3.2 (L) 10/09/2021   BILITOT 0.8 10/09/2021   ALKPHOS 94 10/09/2021   AST 65 (H) 10/09/2021   ALT 58 (H) 10/09/2021   ANIONGAP 8 10/12/2021    CBG (last 3)  No results for input(s): GLUCAP in the last 72 hours.   GFR: Estimated Creatinine Clearance: 44.5 mL/min (by C-G formula based on SCr of 1.21 mg/dL).  Coagulation Profile: Recent Labs  Lab 10/08/21 2202 10/10/21 0252 10/11/21 0443 10/12/21 0449 10/13/21 0532  INR 2.3* 3.8* 2.7* 2.1* 2.6*     Recent Results (from the past 240 hour(s))  Resp Panel by RT-PCR (Flu A&B, Covid) Nasopharyngeal Swab      Status: None   Collection Time: 10/08/21 10:02 PM   Specimen: Nasopharyngeal Swab; Nasopharyngeal(NP) swabs in vial transport medium  Result Value Ref Range Status   SARS Coronavirus 2 by RT PCR NEGATIVE NEGATIVE Final    Comment: (NOTE) SARS-CoV-2 target nucleic acids are NOT DETECTED.  The SARS-CoV-2 RNA is generally detectable in upper respiratory specimens during the acute phase of infection. The lowest concentration of SARS-CoV-2 viral copies this assay can detect is 138 copies/mL. A negative result does not preclude SARS-Cov-2 infection and should not be used as the sole basis for treatment or other patient management decisions. A negative result may occur with  improper specimen collection/handling, submission of specimen other than nasopharyngeal swab, presence of viral mutation(s) within the areas targeted by this assay, and inadequate number of viral copies(<138 copies/mL). A negative result must be combined with clinical observations, patient history, and epidemiological information. The expected result is Negative.  Fact Sheet for Patients:  EntrepreneurPulse.com.au  Fact Sheet for Healthcare Providers:  IncredibleEmployment.be  This test is no t yet approved or cleared by the [place] FDA and  has been authorized for detection and/or diagnosis of SARS-CoV-2 by FDA under an Emergency Use Authorization (EUA). This EUA will remain  in effect (  meaning this test can be used) for the duration of the COVID-19 declaration under Section 564(b)(1) of the Act, 21 U.S.C.section 360bbb-3(b)(1), unless the authorization is terminated  or revoked sooner.       Influenza A by PCR NEGATIVE NEGATIVE Final   Influenza B by PCR NEGATIVE NEGATIVE Final    Comment: (NOTE) The Xpert Xpress SARS-CoV-2/FLU/RSV plus assay is intended as an aid in the diagnosis of influenza from Nasopharyngeal swab specimens and should not be used as a sole  basis for treatment. Nasal washings and aspirates are unacceptable for Xpert Xpress SARS-CoV-2/FLU/RSV testing.  Fact Sheet for Patients: EntrepreneurPulse.com.au  Fact Sheet for Healthcare Providers: IncredibleEmployment.be  This test is not yet approved or cleared by the [place] FDA and has been authorized for detection and/or diagnosis of SARS-CoV-2 by FDA under an Emergency Use Authorization (EUA). This EUA will remain in effect (meaning this test can be used) for the duration of the COVID-19 declaration under Section 564(b)(1) of the Act, 21 U.S.C. section 360bbb-3(b)(1), unless the authorization is terminated or revoked.  Performed at [facility], [place] [address], [place], [place] [address]   Blood culture (routine single)     Status: Abnormal   Collection Time: 10/08/21 10:11 PM   Specimen: BLOOD  Result Value Ref Range Status   Specimen Description   Final    BLOOD RIGHT ANTECUBITAL Performed at [facility] [address] [place], [place] [address]    Special Requests   Final    BOTTLES DRAWN AEROBIC AND ANAEROBIC Blood Culture adequate volume Performed at [facility] [address], [place], [place] [address]    Culture  Setup Time   Final    GRAM NEGATIVE RODS AEROBIC BOTTLE ONLY CRITICAL RESULT CALLED TO, READ BACK BY AND VERIFIED WITH: PHARMD LEIGHANN P. 10/10/21@2 :28 BY TW Performed at [facility], [place] [address], [place],  [address]    Culture PROTEUS MIRABILIS (A)  Final   Report Status 10/12/2021 FINAL  Final   Organism ID, Bacteria PROTEUS MIRABILIS  Final      Susceptibility   Proteus mirabilis - MIC*    AMPICILLIN <=2 SENSITIVE Sensitive     CEFAZOLIN <=4 SENSITIVE Sensitive     CEFEPIME <=0.12 SENSITIVE Sensitive     CEFTAZIDIME <=1 SENSITIVE Sensitive     CEFTRIAXONE <=0.25 SENSITIVE Sensitive     CIPROFLOXACIN <=0.25 SENSITIVE  Sensitive     GENTAMICIN <=1 SENSITIVE Sensitive     IMIPENEM 2 SENSITIVE Sensitive     TRIMETH/SULFA <=20 SENSITIVE Sensitive     AMPICILLIN/SULBACTAM <=2 SENSITIVE Sensitive     PIP/TAZO <=4 SENSITIVE Sensitive     * PROTEUS MIRABILIS  Blood Culture ID Panel (Reflexed)     Status: Abnormal   Collection Time: 10/08/21 10:11 PM  Result Value Ref Range Status   Enterococcus faecalis NOT DETECTED NOT DETECTED Final   Enterococcus Faecium NOT DETECTED NOT DETECTED Final   Listeria monocytogenes NOT DETECTED NOT DETECTED Final   Staphylococcus species NOT DETECTED NOT DETECTED Final   Staphylococcus aureus (BCID) NOT DETECTED NOT DETECTED Final   Staphylococcus epidermidis NOT DETECTED NOT DETECTED Final   Staphylococcus lugdunensis NOT DETECTED NOT DETECTED Final   Streptococcus species NOT DETECTED NOT DETECTED Final   Streptococcus agalactiae NOT DETECTED NOT DETECTED Final   Streptococcus pneumoniae NOT DETECTED NOT DETECTED Final   Streptococcus pyogenes NOT DETECTED NOT DETECTED Final   A.calcoaceticus-baumannii NOT DETECTED NOT DETECTED Final  Bacteroides fragilis NOT DETECTED NOT DETECTED Final   Enterobacterales DETECTED (A) NOT DETECTED Final    Comment: Enterobacterales represent a large order of gram negative bacteria, not a single organism. CRITICAL RESULT CALLED TO, READ BACK BY AND VERIFIED WITH: PHARMD LEIGHANN P. 10/10/21@2 :28 BY TW    Enterobacter cloacae complex NOT DETECTED NOT DETECTED Final   Escherichia coli NOT DETECTED NOT DETECTED Final   Klebsiella aerogenes NOT DETECTED NOT DETECTED Final   Klebsiella oxytoca NOT DETECTED NOT DETECTED Final   Klebsiella pneumoniae NOT DETECTED NOT DETECTED Final   Proteus species DETECTED (A) NOT DETECTED Final    Comment: CRITICAL RESULT CALLED TO, READ BACK BY AND VERIFIED WITH: PHARMD LEIGHANN P. 10/10/21@2 :28 BY TW    Salmonella species NOT DETECTED NOT DETECTED Final   Serratia marcescens NOT DETECTED NOT DETECTED  Final   Haemophilus influenzae NOT DETECTED NOT DETECTED Final   Neisseria meningitidis NOT DETECTED NOT DETECTED Final   Pseudomonas aeruginosa NOT DETECTED NOT DETECTED Final   Stenotrophomonas maltophilia NOT DETECTED NOT DETECTED Final   Candida albicans NOT DETECTED NOT DETECTED Final   Candida auris NOT DETECTED NOT DETECTED Final   Candida glabrata NOT DETECTED NOT DETECTED Final   Candida krusei NOT DETECTED NOT DETECTED Final   Candida parapsilosis NOT DETECTED NOT DETECTED Final   Candida tropicalis NOT DETECTED NOT DETECTED Final   Cryptococcus neoformans/gattii NOT DETECTED NOT DETECTED Final   CTX-M ESBL NOT DETECTED NOT DETECTED Final   Carbapenem resistance IMP NOT DETECTED NOT DETECTED Final   Carbapenem resistance KPC NOT DETECTED NOT DETECTED Final   Carbapenem resistance NDM NOT DETECTED NOT DETECTED Final   Carbapenem resist OXA 48 LIKE NOT DETECTED NOT DETECTED Final   Carbapenem resistance VIM NOT DETECTED NOT DETECTED Final    Comment: Performed at [facility], [address], [place], [place] [address]  Urine Culture     Status: Abnormal   Collection Time: 10/08/21 10:22 PM   Specimen: In/Out Cath Urine  Result Value Ref Range Status   Specimen Description   Final    IN/OUT CATH URINE Performed at [facility], [place] [address], [place], [place] [address]    Special Requests   Final    NONE Performed at [facility], [place] [address] [place], [place] [address]    Culture 90,000 COLONIES/mL PROTEUS MIRABILIS (A)  Final   Report Status 10/10/2021 FINAL  Final   Organism ID, Bacteria PROTEUS MIRABILIS (A)  Final      Susceptibility   Proteus mirabilis - MIC*    AMPICILLIN <=2 SENSITIVE Sensitive     CEFAZOLIN <=4 SENSITIVE Sensitive     CEFEPIME <=0.12 SENSITIVE Sensitive     CEFTRIAXONE <=0.25 SENSITIVE Sensitive     CIPROFLOXACIN <=0.25 SENSITIVE Sensitive     GENTAMICIN <=1 SENSITIVE Sensitive     IMIPENEM 2  SENSITIVE Sensitive     NITROFURANTOIN 128 RESISTANT Resistant     TRIMETH/SULFA <=20 SENSITIVE Sensitive     AMPICILLIN/SULBACTAM <=2 SENSITIVE Sensitive     PIP/TAZO <=4 SENSITIVE Sensitive     * 90,000 COLONIES/mL PROTEUS MIRABILIS  MRSA Next Gen by PCR, Nasal     Status: None   Collection Time: 10/09/21  1:53 PM   Specimen: Nasal Mucosa; Nasal Swab  Result Value Ref Range Status   MRSA by PCR Next Gen NOT DETECTED NOT DETECTED Final    Comment: (NOTE) The GeneXpert MRSA Assay (FDA approved for NASAL specimens only), is  one component of a comprehensive MRSA colonization surveillance program. It is not intended to diagnose MRSA infection nor to guide or monitor treatment for MRSA infections. Test performance is not FDA approved in patients less than 44 years old. Performed at [facility], [place] [address] [place], [place] [address]          Radiology Studies: No results found.      Scheduled Meds:  Chlorhexidine Gluconate Cloth  6 each Topical Daily   DULoxetine  60 mg Oral Daily   furosemide  40 mg Oral Daily   influenza vaccine adjuvanted  0.5 mL Intramuscular Tomorrow-1000   levothyroxine  25 mcg Oral Q0600   loratadine  10 mg Oral Daily   polyethylene glycol  17 g Oral Daily   pregabalin  25 mg Oral QHS   topiramate  50 mg Oral BID   Warfarin - Pharmacist Dosing Inpatient   Does not apply q1600   Continuous Infusions:  ampicillin (OMNIPEN) IV 2 g (10/13/21 1100)     LOS: 4 days     Cordelia Poche, MD [facility] 10/13/2021, 12:14 PM  If 7PM-7AM, please contact night-coverage [email or web address]

## 2021-10-13 NOTE — Progress Notes (Signed)
[place] for Warfarin Indication: atrial fibrillation  Allergies  Allergen Reactions   Sulfa Antibiotics Other (See Comments)    Weakness, dizziness   Primidone     Other reaction(s): increased sedation   Simvastatin     Other reaction(s): arthralgias and fatigue, severe   Patient Measurements: Height: 5' 9.5" (176.5 cm) Weight: 102.9 kg (226 lb 13.7 oz) IBW/kg (Calculated) : 71.85  Vital Signs: Temp: 98.6 F (37 C) (10/15 0453) Temp Source: Oral (10/15 0453) BP: 147/78 (10/15 0453) Pulse Rate: 70 (10/15 0453)  Labs: Recent Labs    10/11/21 0443 10/12/21 0449 10/13/21 0532  HGB 10.7* 9.9*  --   HCT 31.9* 29.4*  --   PLT 68* 63*  --   LABPROT 28.9* 23.4* 27.6*  INR 2.7* 2.1* 2.6*  CREATININE 1.40* 1.21  --     Estimated Creatinine Clearance: 44.5 mL/min (by C-G formula based on SCr of 1.21 mg/dL).  Medications: PTA Warfarin  Assessment: 87 yr male admitted with suspected sepsis secondary to UTI with a history of ESBL infections.  PMH significant for chronic dCHF, PAF, CVS, CKD and thrombocytopenia Patient on warfarin PTA for Afib at a reported dose of 7.5mg  daily except 10mg  on Fridays, last dose PTA reported as being taken 10/10 at 1830  10/13/21 INR 2.6 today-  therapeutic (Warfarin held 10/12 for INR 3.8) No reported bleeding Last CBC 10/14 HG 9.9, PLT 63  Goal of Therapy:  INR 2-3   Plan:  Repeat Warfarin 7.5 mg po x 1 dose today Daily PT/INR  Eudelia Bunch, Pharm.D 10/13/2021 1:55 PM [facility] [phone] 10/13/2021, 1:54 PM

## 2021-10-14 LAB — PROTIME-INR
INR: 2.6 — ABNORMAL HIGH (ref 0.8–1.2)
Prothrombin Time: 28.2 s — ABNORMAL HIGH (ref 11.4–15.2)

## 2021-10-14 LAB — CULTURE, BLOOD (SINGLE): Special Requests: ADEQUATE

## 2021-10-14 LAB — RESP PANEL BY RT-PCR (FLU A&B, COVID) ARPGX2
Influenza A by PCR: NEGATIVE
Influenza B by PCR: NEGATIVE
SARS Coronavirus 2 by RT PCR: NEGATIVE

## 2021-10-14 MED ORDER — WARFARIN SODIUM 5 MG PO TABS
7.5000 mg | ORAL_TABLET | ORAL | Status: DC
  Administered 2021-10-14: 7.5 mg via ORAL
  Filled 2021-10-14: qty 1

## 2021-10-14 MED ORDER — TRAMADOL HCL 50 MG PO TABS
50.0000 mg | ORAL_TABLET | Freq: Once | ORAL | Status: AC
  Administered 2021-10-14: 50 mg via ORAL
  Filled 2021-10-14: qty 1

## 2021-10-14 MED ORDER — WARFARIN SODIUM 5 MG PO TABS: 10.0000 mg | ORAL_TABLET | ORAL | Status: DC

## 2021-10-14 NOTE — Plan of Care (Signed)
  Problem: Clinical Measurements: Goal: Diagnostic test results will improve Outcome: Progressing Goal: Signs and symptoms of infection will decrease Outcome: Progressing   Problem: Respiratory: Goal: Ability to maintain adequate ventilation will improve Outcome: Progressing   Problem: Education: Goal: Knowledge of General Education information will improve Description: Including pain rating scale, medication(s)/side effects and non-pharmacologic comfort measures Outcome: Progressing   Problem: Pain Managment: Goal: General experience of comfort will improve Outcome: Progressing   Problem: Safety: Goal: Ability to remain free from injury will improve Outcome: Progressing   Problem: Skin Integrity: Goal: Risk for impaired skin integrity will decrease Outcome: Progressing

## 2021-10-14 NOTE — Progress Notes (Signed)
ID Brief Note  Can transition Ampicillin to Amoxicillin when ready for discharge  Duration of tx total 10 days ( IV and PO).  Rosiland Oz, MD Infectious Disease Physician [facility] [address] [place], [place] [address] Phone: [phone]  Fax: [phone]

## 2021-10-14 NOTE — TOC Progression Note (Addendum)
Transition of Care [facility]) - Progression Note    Patient Details  Name: Douglas Walker MRN: [id number] Date of Birth: 03-06-1925  Transition of Care [facility]) CM/SW Contact  Ross Ludwig, [place] Phone Number: 10/14/2021, 11:56 AM  Clinical Narrative:     CSW called patient's daughter in law to confirm the plan is to still go to SNF for short term rehab depending on how patient is doing.  Per CSW note from last week, patient's family have chosen [facility]  CSW sent updated clinicals to SNFs for short term rehab.  CSW  attempted to contact [facility] to confirm bed is still available for patient later in this week if he is medically ready for discharge, CSW had to leave a message waiting for call back.  CSW requested attending physician to order a new covid test, per physician he will order it.  TOC to continue to follow patient's progress throughout discharge planning.    Expected Discharge Plan: Skilled Nursing Facility Barriers to Discharge: SNF Pending bed offer  Expected Discharge Plan and Services Expected Discharge Plan: [place]   Discharge Planning Services: CM Consult Post Acute Care Choice: [place] Living arrangements for the past 2 months: Single Family Home                                       Social Determinants of Health (SDOH) Interventions    Readmission Risk Interventions Readmission Risk Prevention Plan 07/26/2020  Transportation Screening Complete  PCP or Specialist Appt within 5-7 Days Not Complete  Not Complete comments pending disposition  Home Care Screening Complete  Medication Review (RN CM) Referral to Pharmacy  Some recent data might be hidden

## 2021-10-14 NOTE — Progress Notes (Signed)
PROGRESS NOTE    Douglas Walker  CVE:[id number] DOB: February 16, 1925 DOA: 10/08/2021 PCP: Josetta Huddle, MD   Brief Narrative: Douglas Walker is a 87 y.o. male with a history of sick sinus syndrome with pacer, paroxysmal atrial fibrillation on warfarin, history of CVA, chronic diastolic CHF, Foley catheter, and recurrent UTI with ESBL. Patient presented secondary to chills and confusion with concern for UTI/sepsis. Empiric antibiotics initiated and cultures obtained with urine culture significant for proteus mirabilis. Blood culture pending.   Assessment & Plan:   Principal Problem:   Bacteremia due to Proteus species Active Problems:   Thrombocytopenia (HCC)   Paroxysmal atrial fibrillation (HCC)   CKD (chronic kidney disease) stage 3, GFR 30-59 ml/min: Probable   Chronic diastolic congestive heart failure (HCC)   Benign prostatic hyperplasia   Cerebrovascular accident (CVA) due to embolism of left middle cerebral artery ([place])   Sepsis secondary to UTI ([place])   Severe sepsis Patient on admission with a Tmax of 39.2 C with associated WBC of 19,600 and lactic acid of 2.1. Secondary to UTI/pyelonephritis. Meropenem IV started empirically for history of ESBL infection. Urine and blood cultures obtained on admission and are significant for proteus mirabilis. Meropenem transitioned to ampicillin IV. Procalcitonin trended down. Leukocytosis resolved.  CAUTI/Pyelonephritis Bacteremia Present on admission. Foley catheter exchanged this admission. Cultures significant for proteus mirabilis. No flank pain. No recurrent/persistent fevers. Urine culture sensitivities not consistent with ESBL; blood culture sensitivities available. No symptoms concerning for abscess formation at this time. No fever overnight. -Continue ampicillin IV -ID recommendations (10/14): Continue ampicillin IV; new recommendations pending  Paroxysmal atrial fibrillation In sinus rhythm. Patient is on warfarin as an  outpatient.  Chronic diastolic heart failure Stable. EF of 50-55% in January 2022. Patient is on Lasix as an outpatient which was held on admission secondary to sepsis. Weight of 99.4 kg on admission. Weight of 102.9 kg on 10/15 which is stable. UOP of 3 L over the last 24 hours -Continue lasix -Strict in and out and daily weights  History of CVA -Continue Warfarin  AKI on CKD stage IIIb Baseline creatinine of about 1.3-1.4. Creatinine of 1.51 on admission with worsening up to peak of 1.77. In setting of hypotension. Resolved.  Chronic foley catheter BPH Patient follows with Urology. He is on nitrofurantoin, finasteride/tamsulosin (recently discontinued) as an outpatient. Current infection is resistant to nitrofurantoin.  Constipation -Miralax, Senokot  Acute respiratory failure with hypoxia Hypoxic. No symptoms noted.  -Wean to room air  Hypothyroidism -Continue home Synthroid 25 mcg daily  Thrombocytopenia Chronic. Stable.  Essential tremor Previously diagnosed. Has been evaluated by neurology.  Sick sinus syndrome S/p pacemaker   DVT prophylaxis: Warfarin Code Status:   Code Status: DNR Family Communication: None at bedside Disposition Plan: Discharge SNF likely in 24 hours pending ID recommendations. COVID test ordered 10/16   Consultants:  Infectious disease  Procedures:  None  Antimicrobials: Meropenem IV  Ampicillin IV   Subjective: No issues overnight or this morning.  Objective: Vitals:   10/13/21 0453 10/13/21 1357 10/13/21 2207 10/14/21 0459  BP: (!) 147/78 (!) 158/81 (!) 149/83 (!) 148/86  Pulse: 70 69 70 70  Resp: 16 18 20 18   Temp: 98.6 F (37 C) 98.4 F (36.9 C) 99.1 F (37.3 C) 98.2 F (36.8 C)  TempSrc: Oral  Oral Oral  SpO2: 97% 100% 100% 97%  Weight: 102.9 kg     Height:        Intake/Output Summary (Last 24 hours) at  10/14/2021 1208 Last data filed at 10/14/2021 1610 Gross per 24 hour  Intake 1441 ml  Output 3001 ml   Net -1560 ml    Filed Weights   10/10/21 0309 10/11/21 0441 10/13/21 0453  Weight: 99.5 kg 102.8 kg 102.9 kg    Examination:  General exam: Appears calm and comfortable  Respiratory system: Clear to auscultation. Respiratory effort normal. Cardiovascular system: S1 & S2 heard, RRR. No murmurs, rubs, gallops or clicks. Gastrointestinal system: Abdomen is nondistended, soft and nontender. No organomegaly or masses felt. Normal bowel sounds heard. Central nervous system: Alert and oriented. Musculoskeletal: No calf tenderness Skin: No cyanosis. No rashes    Data Reviewed: I have personally reviewed following labs and imaging studies  CBC Lab Results  Component Value Date   WBC 9.0 10/12/2021   RBC 2.97 (L) 10/12/2021   HGB 9.9 (L) 10/12/2021   HCT 29.4 (L) 10/12/2021   MCV 99.0 10/12/2021   MCH 33.3 10/12/2021   PLT 63 (L) 10/12/2021   MCHC 33.7 10/12/2021   RDW 15.2 10/12/2021   LYMPHSABS 0.3 (L) 10/08/2021   MONOABS 0.5 10/08/2021   EOSABS 0.0 10/08/2021   BASOSABS 0.0 96/03/5408     Last metabolic panel Lab Results  Component Value Date   NA 135 10/12/2021   K 4.0 10/12/2021   CL 105 10/12/2021   CO2 22 10/12/2021   BUN 28 (H) 10/12/2021   CREATININE 1.21 10/12/2021   GLUCOSE 111 (H) 10/12/2021   GFRNONAA 55 (L) 10/12/2021   GFRAA 45 (L) 08/24/2020   CALCIUM 8.2 (L) 10/12/2021   PHOS 3.8 02/01/2021   PROT 5.7 (L) 10/09/2021   ALBUMIN 3.2 (L) 10/09/2021   BILITOT 0.8 10/09/2021   ALKPHOS 94 10/09/2021   AST 65 (H) 10/09/2021   ALT 58 (H) 10/09/2021   ANIONGAP 8 10/12/2021    CBG (last 3)  No results for input(s): GLUCAP in the last 72 hours.   GFR: Estimated Creatinine Clearance: 44.5 mL/min (by C-G formula based on SCr of 1.21 mg/dL).  Coagulation Profile: Recent Labs  Lab 10/10/21 0252 10/11/21 0443 10/12/21 0449 10/13/21 0532 10/14/21 0447  INR 3.8* 2.7* 2.1* 2.6* 2.6*     Recent Results (from the past 240 hour(s))  Resp Panel by  RT-PCR (Flu A&B, Covid) Nasopharyngeal Swab     Status: None   Collection Time: 10/08/21 10:02 PM   Specimen: Nasopharyngeal Swab; Nasopharyngeal(NP) swabs in vial transport medium  Result Value Ref Range Status   SARS Coronavirus 2 by RT PCR NEGATIVE NEGATIVE Final    Comment: (NOTE) SARS-CoV-2 target nucleic acids are NOT DETECTED.  The SARS-CoV-2 RNA is generally detectable in upper respiratory specimens during the acute phase of infection. The lowest concentration of SARS-CoV-2 viral copies this assay can detect is 138 copies/mL. A negative result does not preclude SARS-Cov-2 infection and should not be used as the sole basis for treatment or other patient management decisions. A negative result may occur with  improper specimen collection/handling, submission of specimen other than nasopharyngeal swab, presence of viral mutation(s) within the areas targeted by this assay, and inadequate number of viral copies(<138 copies/mL). A negative result must be combined with clinical observations, patient history, and epidemiological information. The expected result is Negative.  Fact Sheet for Patients:  EntrepreneurPulse.com.au  Fact Sheet for Healthcare Providers:  IncredibleEmployment.be  This test is no t yet approved or cleared by the [place] FDA and  has been authorized for detection and/or diagnosis of SARS-CoV-2 by FDA  under an Emergency Use Authorization (EUA). This EUA will remain  in effect (meaning this test can be used) for the duration of the COVID-19 declaration under Section 564(b)(1) of the Act, 21 U.S.C.section 360bbb-3(b)(1), unless the authorization is terminated  or revoked sooner.       Influenza A by PCR NEGATIVE NEGATIVE Final   Influenza B by PCR NEGATIVE NEGATIVE Final    Comment: (NOTE) The Xpert Xpress SARS-CoV-2/FLU/RSV plus assay is intended as an aid in the diagnosis of influenza from Nasopharyngeal swab  specimens and should not be used as a sole basis for treatment. Nasal washings and aspirates are unacceptable for Xpert Xpress SARS-CoV-2/FLU/RSV testing.  Fact Sheet for Patients: EntrepreneurPulse.com.au  Fact Sheet for Healthcare Providers: IncredibleEmployment.be  This test is not yet approved or cleared by the [place] FDA and has been authorized for detection and/or diagnosis of SARS-CoV-2 by FDA under an Emergency Use Authorization (EUA). This EUA will remain in effect (meaning this test can be used) for the duration of the COVID-19 declaration under Section 564(b)(1) of the Act, 21 U.S.C. section 360bbb-3(b)(1), unless the authorization is terminated or revoked.  Performed at [facility], [place] [address], [place], [place] [address]   Blood culture (routine single)     Status: Abnormal   Collection Time: 10/08/21 10:11 PM   Specimen: BLOOD  Result Value Ref Range Status   Specimen Description   Final    BLOOD RIGHT ANTECUBITAL Performed at [facility] [address] [place], [place] [address]    Special Requests   Final    BOTTLES DRAWN AEROBIC AND ANAEROBIC Blood Culture adequate volume Performed at [facility] [address] [place], [place] [address]    Culture  Setup Time   Final    GRAM NEGATIVE RODS IN BOTH AEROBIC AND ANAEROBIC BOTTLES CRITICAL RESULT CALLED TO, READ BACK BY AND VERIFIED WITH: PHARMD LEIGHANN P. 10/10/21@2 :28 BY TW Performed at [facility], [place] [address], [place], [place] [address]    Culture PROTEUS MIRABILIS (A)  Final   Report Status 10/14/2021 FINAL  Final   Organism ID, Bacteria PROTEUS MIRABILIS  Final      Susceptibility   Proteus mirabilis - MIC*    AMPICILLIN <=2 SENSITIVE Sensitive     CEFAZOLIN <=4 SENSITIVE Sensitive     CEFEPIME <=0.12 SENSITIVE Sensitive     CEFTAZIDIME <=1 SENSITIVE Sensitive     CEFTRIAXONE  <=0.25 SENSITIVE Sensitive     CIPROFLOXACIN <=0.25 SENSITIVE Sensitive     GENTAMICIN <=1 SENSITIVE Sensitive     IMIPENEM 2 SENSITIVE Sensitive     TRIMETH/SULFA <=20 SENSITIVE Sensitive     AMPICILLIN/SULBACTAM <=2 SENSITIVE Sensitive     PIP/TAZO <=4 SENSITIVE Sensitive     * PROTEUS MIRABILIS  Blood Culture ID Panel (Reflexed)     Status: Abnormal   Collection Time: 10/08/21 10:11 PM  Result Value Ref Range Status   Enterococcus faecalis NOT DETECTED NOT DETECTED Final   Enterococcus Faecium NOT DETECTED NOT DETECTED Final   Listeria monocytogenes NOT DETECTED NOT DETECTED Final   Staphylococcus species NOT DETECTED NOT DETECTED Final   Staphylococcus aureus (BCID) NOT DETECTED NOT DETECTED Final   Staphylococcus epidermidis NOT DETECTED NOT DETECTED Final   Staphylococcus lugdunensis NOT DETECTED NOT DETECTED Final   Streptococcus species NOT DETECTED NOT DETECTED Final   Streptococcus agalactiae NOT DETECTED NOT DETECTED Final   Streptococcus pneumoniae NOT DETECTED NOT DETECTED Final   Streptococcus  pyogenes NOT DETECTED NOT DETECTED Final   A.calcoaceticus-baumannii NOT DETECTED NOT DETECTED Final   Bacteroides fragilis NOT DETECTED NOT DETECTED Final   Enterobacterales DETECTED (A) NOT DETECTED Final    Comment: Enterobacterales represent a large order of gram negative bacteria, not a single organism. CRITICAL RESULT CALLED TO, READ BACK BY AND VERIFIED WITH: PHARMD LEIGHANN P. 10/10/21@2 :28 BY TW    Enterobacter cloacae complex NOT DETECTED NOT DETECTED Final   Escherichia coli NOT DETECTED NOT DETECTED Final   Klebsiella aerogenes NOT DETECTED NOT DETECTED Final   Klebsiella oxytoca NOT DETECTED NOT DETECTED Final   Klebsiella pneumoniae NOT DETECTED NOT DETECTED Final   Proteus species DETECTED (A) NOT DETECTED Final    Comment: CRITICAL RESULT CALLED TO, READ BACK BY AND VERIFIED WITH: PHARMD LEIGHANN P. 10/10/21@2 :28 BY TW    Salmonella species NOT DETECTED NOT  DETECTED Final   Serratia marcescens NOT DETECTED NOT DETECTED Final   Haemophilus influenzae NOT DETECTED NOT DETECTED Final   Neisseria meningitidis NOT DETECTED NOT DETECTED Final   Pseudomonas aeruginosa NOT DETECTED NOT DETECTED Final   Stenotrophomonas maltophilia NOT DETECTED NOT DETECTED Final   Candida albicans NOT DETECTED NOT DETECTED Final   Candida auris NOT DETECTED NOT DETECTED Final   Candida glabrata NOT DETECTED NOT DETECTED Final   Candida krusei NOT DETECTED NOT DETECTED Final   Candida parapsilosis NOT DETECTED NOT DETECTED Final   Candida tropicalis NOT DETECTED NOT DETECTED Final   Cryptococcus neoformans/gattii NOT DETECTED NOT DETECTED Final   CTX-M ESBL NOT DETECTED NOT DETECTED Final   Carbapenem resistance IMP NOT DETECTED NOT DETECTED Final   Carbapenem resistance KPC NOT DETECTED NOT DETECTED Final   Carbapenem resistance NDM NOT DETECTED NOT DETECTED Final   Carbapenem resist OXA 48 LIKE NOT DETECTED NOT DETECTED Final   Carbapenem resistance VIM NOT DETECTED NOT DETECTED Final    Comment: Performed at [facility], [address] [place], [place] [address]  Urine Culture     Status: Abnormal   Collection Time: 10/08/21 10:22 PM   Specimen: In/Out Cath Urine  Result Value Ref Range Status   Specimen Description   Final    IN/OUT CATH URINE Performed at [facility], [place] [address] [place], [place] [address]    Special Requests   Final    NONE Performed at [facility], [place] [address], [place], [place] [address]    Culture 90,000 COLONIES/mL PROTEUS MIRABILIS (A)  Final   Report Status 10/10/2021 FINAL  Final   Organism ID, Bacteria PROTEUS MIRABILIS (A)  Final      Susceptibility   Proteus mirabilis - MIC*    AMPICILLIN <=2 SENSITIVE Sensitive     CEFAZOLIN <=4 SENSITIVE Sensitive     CEFEPIME <=0.12 SENSITIVE Sensitive     CEFTRIAXONE <=0.25 SENSITIVE Sensitive     CIPROFLOXACIN <=0.25 SENSITIVE  Sensitive     GENTAMICIN <=1 SENSITIVE Sensitive     IMIPENEM 2 SENSITIVE Sensitive     NITROFURANTOIN 128 RESISTANT Resistant     TRIMETH/SULFA <=20 SENSITIVE Sensitive     AMPICILLIN/SULBACTAM <=2 SENSITIVE Sensitive     PIP/TAZO <=4 SENSITIVE Sensitive     * 90,000 COLONIES/mL PROTEUS MIRABILIS  MRSA Next Gen by PCR, Nasal     Status: None   Collection Time: 10/09/21  1:53 PM   Specimen: Nasal Mucosa; Nasal Swab  Result Value Ref Range Status   MRSA by PCR Next Gen NOT DETECTED NOT DETECTED Final  Comment: (NOTE) The GeneXpert MRSA Assay (FDA approved for NASAL specimens only), is one component of a comprehensive MRSA colonization surveillance program. It is not intended to diagnose MRSA infection nor to guide or monitor treatment for MRSA infections. Test performance is not FDA approved in patients less than 20 years old. Performed at [facility], [place] [address], [place], [place] [address]          Radiology Studies: No results found.      Scheduled Meds:  Chlorhexidine Gluconate Cloth  6 each Topical Daily   DULoxetine  60 mg Oral Daily   furosemide  40 mg Oral Daily   levothyroxine  25 mcg Oral Q0600   loratadine  10 mg Oral Daily   polyethylene glycol  17 g Oral Daily   pregabalin  25 mg Oral QHS   topiramate  50 mg Oral BID   Warfarin - Pharmacist Dosing Inpatient   Does not apply q1600   Continuous Infusions:  ampicillin (OMNIPEN) IV 2 g (10/14/21 0516)     LOS: 5 days     Cordelia Poche, MD [facility] 10/14/2021, 12:08 PM  If 7PM-7AM, please contact night-coverage [email or web address]

## 2021-10-14 NOTE — Progress Notes (Signed)
[facility] for Warfarin Indication: atrial fibrillation  Allergies  Allergen Reactions   Sulfa Antibiotics Other (See Comments)    Weakness, dizziness   Primidone     Other reaction(s): increased sedation   Simvastatin     Other reaction(s): arthralgias and fatigue, severe   Patient Measurements: Height: 5' 9.5" (176.5 cm) Weight: 102.9 kg (226 lb 13.7 oz) IBW/kg (Calculated) : 71.85  Vital Signs: Temp: 98.2 F (36.8 C) (10/16 0459) Temp Source: Oral (10/16 0459) BP: 148/86 (10/16 0459) Pulse Rate: 70 (10/16 0459)  Labs: Recent Labs    10/12/21 0449 10/13/21 0532 10/14/21 0447  HGB 9.9*  --   --   HCT 29.4*  --   --   PLT 63*  --   --   LABPROT 23.4* 27.6* 28.2*  INR 2.1* 2.6* 2.6*  CREATININE 1.21  --   --     Estimated Creatinine Clearance: 44.5 mL/min (by C-G formula based on SCr of 1.21 mg/dL).  Medications: PTA Warfarin  Assessment: 87 yr male admitted with suspected sepsis secondary to UTI with a history of ESBL infections.  PMH significant for chronic dCHF, PAF, CVS, CKD and thrombocytopenia Patient on warfarin PTA for Afib at a reported dose of 7.5mg  daily except 10mg  on Fridays, last dose PTA reported as being taken 10/10 at 1830  10/14/21 INR remains 2.6 today-  therapeutic (Warfarin held 10/12 for INR 3.8) No reported bleeding Last CBC 10/14 HG 9.9, PLT 63  Goal of Therapy:  INR 2-3   Plan:  Resume home dose of  Warfarin 7.5 mg po daily x 10 mg on Fridays Decrease INRs to TTS  Eudelia Bunch, Pharm.D 10/14/2021 1:16 PM [facility] [phone] 10/14/2021, 1:16 PM

## 2021-10-15 DIAGNOSIS — B964 Proteus (mirabilis) (morganii) as the cause of diseases classified elsewhere: Secondary | ICD-10-CM | POA: Diagnosis not present

## 2021-10-15 DIAGNOSIS — N401 Enlarged prostate with lower urinary tract symptoms: Secondary | ICD-10-CM | POA: Diagnosis not present

## 2021-10-15 DIAGNOSIS — R7881 Bacteremia: Secondary | ICD-10-CM | POA: Diagnosis not present

## 2021-10-15 DIAGNOSIS — G250 Essential tremor: Secondary | ICD-10-CM | POA: Diagnosis not present

## 2021-10-15 DIAGNOSIS — R0902 Hypoxemia: Secondary | ICD-10-CM | POA: Diagnosis not present

## 2021-10-15 DIAGNOSIS — Z23 Encounter for immunization: Secondary | ICD-10-CM | POA: Diagnosis not present

## 2021-10-15 DIAGNOSIS — M6281 Muscle weakness (generalized): Secondary | ICD-10-CM | POA: Diagnosis not present

## 2021-10-15 DIAGNOSIS — I63412 Cerebral infarction due to embolism of left middle cerebral artery: Secondary | ICD-10-CM | POA: Diagnosis not present

## 2021-10-15 DIAGNOSIS — E039 Hypothyroidism, unspecified: Secondary | ICD-10-CM | POA: Diagnosis not present

## 2021-10-15 DIAGNOSIS — I5032 Chronic diastolic (congestive) heart failure: Secondary | ICD-10-CM | POA: Diagnosis not present

## 2021-10-15 DIAGNOSIS — I5042 Chronic combined systolic (congestive) and diastolic (congestive) heart failure: Secondary | ICD-10-CM | POA: Diagnosis not present

## 2021-10-15 DIAGNOSIS — R2689 Other abnormalities of gait and mobility: Secondary | ICD-10-CM | POA: Diagnosis not present

## 2021-10-15 DIAGNOSIS — Z743 Need for continuous supervision: Secondary | ICD-10-CM | POA: Diagnosis not present

## 2021-10-15 DIAGNOSIS — I639 Cerebral infarction, unspecified: Secondary | ICD-10-CM | POA: Diagnosis not present

## 2021-10-15 DIAGNOSIS — I63512 Cerebral infarction due to unspecified occlusion or stenosis of left middle cerebral artery: Secondary | ICD-10-CM | POA: Diagnosis not present

## 2021-10-15 DIAGNOSIS — I495 Sick sinus syndrome: Secondary | ICD-10-CM | POA: Diagnosis not present

## 2021-10-15 DIAGNOSIS — N390 Urinary tract infection, site not specified: Secondary | ICD-10-CM | POA: Diagnosis not present

## 2021-10-15 DIAGNOSIS — N1832 Chronic kidney disease, stage 3b: Secondary | ICD-10-CM | POA: Diagnosis not present

## 2021-10-15 DIAGNOSIS — R338 Other retention of urine: Secondary | ICD-10-CM | POA: Diagnosis not present

## 2021-10-15 DIAGNOSIS — D696 Thrombocytopenia, unspecified: Secondary | ICD-10-CM | POA: Diagnosis not present

## 2021-10-15 DIAGNOSIS — I480 Paroxysmal atrial fibrillation: Secondary | ICD-10-CM | POA: Diagnosis not present

## 2021-10-15 DIAGNOSIS — N3021 Other chronic cystitis with hematuria: Secondary | ICD-10-CM | POA: Diagnosis not present

## 2021-10-15 DIAGNOSIS — Z5181 Encounter for therapeutic drug level monitoring: Secondary | ICD-10-CM | POA: Diagnosis not present

## 2021-10-15 DIAGNOSIS — Y846 Urinary catheterization as the cause of abnormal reaction of the patient, or of later complication, without mention of misadventure at the time of the procedure: Secondary | ICD-10-CM | POA: Diagnosis not present

## 2021-10-15 DIAGNOSIS — G609 Hereditary and idiopathic neuropathy, unspecified: Secondary | ICD-10-CM | POA: Diagnosis not present

## 2021-10-15 DIAGNOSIS — A419 Sepsis, unspecified organism: Secondary | ICD-10-CM | POA: Diagnosis not present

## 2021-10-15 DIAGNOSIS — N400 Enlarged prostate without lower urinary tract symptoms: Secondary | ICD-10-CM | POA: Diagnosis not present

## 2021-10-15 DIAGNOSIS — R278 Other lack of coordination: Secondary | ICD-10-CM | POA: Diagnosis not present

## 2021-10-15 DIAGNOSIS — N1831 Chronic kidney disease, stage 3a: Secondary | ICD-10-CM | POA: Diagnosis not present

## 2021-10-15 DIAGNOSIS — Z8673 Personal history of transient ischemic attack (TIA), and cerebral infarction without residual deficits: Secondary | ICD-10-CM | POA: Diagnosis not present

## 2021-10-15 DIAGNOSIS — R8279 Other abnormal findings on microbiological examination of urine: Secondary | ICD-10-CM | POA: Diagnosis not present

## 2021-10-15 MED ORDER — AMOXICILLIN 500 MG PO CAPS
500.0000 mg | ORAL_CAPSULE | Freq: Three times a day (TID) | ORAL | Status: DC
  Administered 2021-10-15: 500 mg via ORAL
  Filled 2021-10-15 (×2): qty 1

## 2021-10-15 MED ORDER — AMOXICILLIN 500 MG PO CAPS
500.0000 mg | ORAL_CAPSULE | Freq: Three times a day (TID) | ORAL | Status: DC
  Filled 2021-10-15: qty 1

## 2021-10-15 MED ORDER — AMOXICILLIN 500 MG PO CAPS: 500.0000 mg | ORAL_CAPSULE | Freq: Three times a day (TID) | ORAL | Status: AC

## 2021-10-15 MED ORDER — AMOXICILLIN 500 MG PO CAPS: 500.0000 mg | ORAL_CAPSULE | Freq: Three times a day (TID) | ORAL | Status: DC

## 2021-10-15 NOTE — Care Management Important Message (Signed)
Important Message  Patient Details IM Letter placed in Patients room. Name: Douglas Walker MRN: [id number] Date of Birth: September 25, 1925   Medicare Important Message Given:  Yes     [name] 10/15/2021, 11:33 AM

## 2021-10-15 NOTE — Consult Note (Signed)
[facility] CM Inpatient Consult   10/15/2021  DECARLO RIVET 18-Jan-1925 [phone]  [facility]) Care Management Services   Patient chart reviewed due to high unplanned readmission risk score to assess for post hospital community based chronic complex disease management services with [facility]. Per review, current disposition is for skilled nursing facility. No [facility] care management needs at this time.  Of note, [facility] Care Management services does not replace or interfere with any services that are arranged by inpatient case management or social work.   Netta Cedars, MSN, [facility] Liaison Nurse Mobile Phone [phone]  Toll free office [phone]

## 2021-10-15 NOTE — Progress Notes (Signed)
[facility] for Infectious Disease  Date of Admission:  10/08/2021           Reason for visit: Follow up on bacteremia  Current antibiotics: Amoxicillin PO 10/17-present   Previous antibiotics: Meropenem 10/10-10/11 Ceftriaxone x 1 dose 10/10 Ampicillin 10/12-10/16   ASSESSMENT:    87 y.o. male admitted with:  Proteus mirabilis bacteremia: Secondary to complicated UTI in the setting of chronic Foley and BPH.  Catheter was exchanged this admission and he has improved.  Now transitioned to PO Amoxicillin to complete course. Severe sepsis: Resolved and due to #1 AKI on CKD: Creatinine improved. BPH with hx of chronic Foley: Followed by urology outpatient and on Macrobid suppressoin PTA.  RECOMMENDATIONS:    Continue oral amoxicillin to complete 10 day total course of antibiotics through 10/18/21 Will sign off, please call as needed.   Principal Problem:   Bacteremia due to Proteus species Active Problems:   Thrombocytopenia (HCC)   Paroxysmal atrial fibrillation (HCC)   CKD (chronic kidney disease) stage 3, GFR 30-59 ml/min: Probable   Chronic diastolic congestive heart failure (HCC)   Benign prostatic hyperplasia   Cerebrovascular accident (CVA) due to embolism of left middle cerebral artery (HCC)   Sepsis secondary to UTI [facility])    MEDICATIONS:    Scheduled Meds:  amoxicillin  500 mg Oral Q8H   Chlorhexidine Gluconate Cloth  6 each Topical Daily   DULoxetine  60 mg Oral Daily   furosemide  40 mg Oral Daily   levothyroxine  25 mcg Oral Q0600   loratadine  10 mg Oral Daily   polyethylene glycol  17 g Oral Daily   pregabalin  25 mg Oral QHS   topiramate  50 mg Oral BID   warfarin  7.5 mg Oral Once per day on Sun Mon Tue Wed Thu Sat   And   [START ON 10/19/2021] warfarin  10 mg Oral Once per day on Fri   Warfarin - Pharmacist Dosing Inpatient   Does not apply q1600   Continuous Infusions: PRN Meds:.acetaminophen **OR** acetaminophen, ondansetron  **OR** ondansetron (ZOFRAN) IV, phenol  SUBJECTIVE:   24 hour events:  No events Afebrile Transitioned to PO amoxicillin to complete 10 days abx.  No new complaints.  Feels about the same.  No fevers chills.  No n/v.  TOlerating antibiotics.   Review of Systems  All other systems reviewed and are negative.    OBJECTIVE:   Blood pressure (!) 158/88, pulse 70, temperature 98.1 F (36.7 C), temperature source Axillary, resp. rate 18, height 5' 9.5" (1.765 m), weight 100.9 kg, SpO2 90 %. Body mass index is 32.38 kg/m.  Physical Exam Constitutional:      General: He is not in acute distress.    Appearance: Normal appearance.  HENT:     Head: Normocephalic and atraumatic.  Abdominal:     General: There is no distension.     Palpations: Abdomen is soft.     Tenderness: There is no abdominal tenderness.  Musculoskeletal:        General: Normal range of motion.  Skin:    General: Skin is warm and dry.     Findings: No rash.  Neurological:     General: No focal deficit present.     Mental Status: He is alert and oriented to person, place, and time.  Psychiatric:        Mood and Affect: Mood normal.        Behavior: Behavior normal.  Lab Results: Lab Results  Component Value Date   WBC 9.0 10/12/2021   HGB 9.9 (L) 10/12/2021   HCT 29.4 (L) 10/12/2021   MCV 99.0 10/12/2021   PLT 63 (L) 10/12/2021    Lab Results  Component Value Date   NA 135 10/12/2021   K 4.0 10/12/2021   CO2 22 10/12/2021   GLUCOSE 111 (H) 10/12/2021   BUN 28 (H) 10/12/2021   CREATININE 1.21 10/12/2021   CALCIUM 8.2 (L) 10/12/2021   GFRNONAA 55 (L) 10/12/2021   GFRAA 45 (L) 08/24/2020    Lab Results  Component Value Date   ALT 58 (H) 10/09/2021   AST 65 (H) 10/09/2021   ALKPHOS 94 10/09/2021   BILITOT 0.8 10/09/2021    No results found for: CRP  No results found for: ESRSEDRATE   I have reviewed the micro and lab results in Epic.  Imaging: No results found.   Imaging  independently reviewed in Epic.    Raynelle High[facility] for Infectious Disease [facility] [phone] pager 10/15/2021, 8:18 AM  I spent greater than 35 minutes with the patient including greater than 50% of time in face to face counsel of the patient and in coordination of their care.

## 2021-10-15 NOTE — TOC Transition Note (Signed)
Transition of Care [facility]) - CM/SW Discharge Note   Patient Details  Name: Douglas Walker MRN: [id number] Date of Birth: Aug 04, 1925  Transition of Care [facility]) CM/SW Contact:  Trish Mage, LCSW Phone Number: 10/15/2021, 11:09 AM   Clinical Narrative:   Patient who is stable for d/c will transfer to [facility] SNF today.  Family alerted.  PTAR arranged.  Nursing, please call report to [phone], room 407P. TOC sign off.    Final next level of care: Skilled Nursing Facility Barriers to Discharge: Barriers Resolved   Patient Goals and CMS Choice     Choice offered to / list presented to : Patient, Adult Children  Discharge Placement                       Discharge Plan and Services   Discharge Planning Services: CM Consult Post Acute Care Choice: [place]                               Social Determinants of Health (SDOH) Interventions     Readmission Risk Interventions Readmission Risk Prevention Plan 10/15/2021 07/26/2020  Transportation Screening Complete Complete  PCP or Specialist Appt within 5-7 Days - Not Complete  Not Complete comments - pending disposition  Home Care Screening - Complete  Medication Review (RN CM) - Referral to Pharmacy  Medication Review (RN Care Manager) Complete -  PCP or Specialist appointment within 3-5 days of discharge Complete -  [place] or Home Care Consult Complete -  SW Recovery Care/Counseling Consult Complete -  Palliative Care Screening Not Applicable -  Skilled Nursing Facility Complete -  Some recent data might be hidden

## 2021-10-15 NOTE — Discharge Summary (Signed)
Physician Discharge Summary  [name]:[id number] DOB: December 19, 1925 DOA: 10/08/2021  PCP: Josetta Huddle, MD  Admit date: 10/08/2021 Discharge date: 10/15/2021  Admitted From: Home Disposition: SNF  Recommendations for Outpatient Follow-up:  Follow up with PCP in 1 week Please obtain BMP/CBC in one week Please follow up on the following pending results: None  Discharge Condition: Stable CODE STATUS: DNR Diet recommendation: Dysphagia 3 diet   Brief/Interim Summary:  Admission HPI written by Vianne Bulls, MD   HPI: Douglas Walker is a pleasant 87 y.o. male with medical history significant for sick sinus syndrome with pacer, paroxysmal atrial fibrillation on warfarin, history of CVA, chronic diastolic CHF, Foley catheter, and recurrent UTI with ESBL, now presenting to the emergency department with shaking chills and confusion.  Patient lives with his son and daughter-in-law who noted that he seemed to be a little confused yesterday and states that he developed shaking chills after dinner.  He had not been complaining of anything, did not appear to be short of breath or coughing, and there was no vomiting noted.  Patient was treated for UTI on 09/15/2021 after presenting to the emergency department with suprapubic pain.  He seemed to improve with Keflex at that time but family notes that his urine became malodorous within a couple days of completing the course.   Hospital course:  Severe sepsis Patient on admission with a Tmax of 39.2 C with associated WBC of 19,600 and lactic acid of 2.1. Secondary to UTI/pyelonephritis. Meropenem IV started empirically for history of ESBL infection. Urine and blood cultures obtained on admission and are significant for proteus mirabilis. Meropenem transitioned to ampicillin IV. Procalcitonin trended down. Leukocytosis resolved.   CAUTI/Pyelonephritis Bacteremia Present on admission. Foley catheter exchanged this admission.  Cultures significant for proteus mirabilis. No flank pain. No recurrent/persistent fevers. Urine culture sensitivities not consistent with ESBL; blood culture sensitivities available. No symptoms concerning for abscess formation at this time. Afebrile. ID consulted and recommended 10 day total antibiotic regimen. Discharged on amoxicillin.   Paroxysmal atrial fibrillation In sinus rhythm. Patient is on warfarin as an outpatient.   Chronic diastolic heart failure Stable. EF of 50-55% in January 2022. Patient is on Lasix as an outpatient which was held on admission secondary to sepsis. Weight of 99.4 kg on admission. Weight of 100.9 kg on day of discharge. UOP of 3.1 L over the last 24 hours   History of CVA Continue Warfarin   AKI on CKD stage IIIb Baseline creatinine of about 1.3-1.4. Creatinine of 1.51 on admission with worsening up to peak of 1.77. In setting of hypotension. Resolved.   Chronic foley catheter BPH Patient follows with Urology. He is on nitrofurantoin, finasteride/tamsulosin (recently discontinued) as an outpatient. Current infection is resistant to nitrofurantoin.   Constipation Miralax, Senokot   Acute respiratory failure with hypoxia Hypoxic. No symptoms noted. Weaned to room air. Resolved.   Hypothyroidism Continue home Synthroid 25 mcg daily   Thrombocytopenia Chronic. Stable.   Essential tremor Previously diagnosed. Has been evaluated by neurology.   Sick sinus syndrome S/p pacemaker  Discharge Diagnoses:  Principal Problem:   Bacteremia due to Proteus species Active Problems:   Thrombocytopenia (HCC)   Paroxysmal atrial fibrillation (HCC)   CKD (chronic kidney disease) stage 3, GFR 30-59 ml/min: Probable   Chronic diastolic congestive heart failure (HCC)   Benign prostatic hyperplasia   Cerebrovascular accident (CVA) due to embolism of left middle cerebral artery ([place])   Sepsis secondary to UTI (  [facility])    Discharge Instructions   Allergies as  of 10/15/2021       Reactions   Sulfa Antibiotics Other (See Comments)   Weakness, dizziness   Primidone    Other reaction(s): increased sedation   Simvastatin    Other reaction(s): arthralgias and fatigue, severe        Medication List     STOP taking these medications    cephALEXin 500 MG capsule Commonly known as: KEFLEX   finasteride 5 MG tablet Commonly known as: PROSCAR   tamsulosin 0.4 MG Caps capsule Commonly known as: FLOMAX       TAKE these medications    acetaminophen 325 MG tablet Commonly known as: TYLENOL Take 2 tablets (650 mg total) by mouth every 4 (four) hours as needed for mild pain (or temp > 37.5 C (99.5 F)).   amoxicillin 500 MG capsule Commonly known as: AMOXIL Take 1 capsule (500 mg total) by mouth 3 (three) times daily for 4 days.   DULoxetine 60 MG capsule Commonly known as: Cymbalta Take 1 capsule (60 mg total) by mouth daily.   furosemide 20 MG tablet Commonly known as: LASIX Take 2 tablets (40 mg total) by mouth daily.   levothyroxine 25 MCG tablet Commonly known as: SYNTHROID Take 1 tablet (25 mcg total) by mouth daily before breakfast.   loratadine 10 MG tablet Commonly known as: CLARITIN Take 10 mg by mouth daily.   multivitamin with minerals Tabs tablet Take 1 tablet by mouth daily.   nitrofurantoin (macrocrystal-monohydrate) 100 MG capsule Commonly known as: MACROBID Take 100 mg by mouth daily.   Ocuvite Adult 50+ Caps Take 1 capsule by mouth daily.   phenazopyridine 200 MG tablet Commonly known as: PYRIDIUM Take 200 mg by mouth See admin instructions. 200mg  oral at night And 200mg  extra twice daily as needed for bladder spasms   polyethylene glycol 17 g packet Commonly known as: MIRALAX / GLYCOLAX Take 17 g by mouth daily. What changed:  when to take this reasons to take this   pregabalin 25 MG capsule Commonly known as: LYRICA TAKE 1 CAPSULE(25 MG) BY MOUTH AT BEDTIME What changed:  how much to  take how to take this when to take this additional instructions   primidone 250 MG tablet Commonly known as: MYSOLINE Take 1 tablet (250 mg total) by mouth 2 (two) times daily.   topiramate 50 MG tablet Commonly known as: TOPAMAX Take 1 tablet (50 mg total) by mouth 2 (two) times daily.   vitamin B-12 1000 MCG tablet Commonly known as: CYANOCOBALAMIN Take 1 tablet (1,000 mcg total) by mouth every other day. What changed:  when to take this additional instructions   vitamin C 1000 MG tablet Take 1,000 mg by mouth daily.   warfarin 5 MG tablet Commonly known as: COUMADIN Take as directed. If you are unsure how to take this medication, talk to your nurse or doctor. Original instructions: Take 1 to 1.5 tablets daily as directed by Coumadin clinic What changed:  how much to take how to take this when to take this additional instructions        Allergies  Allergen Reactions   Sulfa Antibiotics Other (See Comments)    Weakness, dizziness   Primidone     Other reaction(s): increased sedation   Simvastatin     Other reaction(s): arthralgias and fatigue, severe    Consultations: Infectious disease   Procedures/Studies: DG Chest Port 1 View  Result Date: 10/08/2021 CLINICAL DATA:  Tremors, questionable sepsis EXAM: PORTABLE CHEST 1 VIEW COMPARISON:  07/02/2021 FINDINGS: Frontal view of the chest demonstrates stable pacer. Cardiac silhouette is unremarkable. Continued ectasia of the thoracic aorta. Linear consolidation at the left lung base may reflect scarring or atelectasis. No acute airspace disease, effusion, or pneumothorax. No acute bony abnormalities. IMPRESSION: 1. Linear consolidation left lung base likely representing atelectasis or scarring. 2. Otherwise unremarkable exam. Electronically Signed   By: Randa Ngo M.D.   On: 10/08/2021 22:28      Subjective: No issues overnight  Discharge Exam: Vitals:   10/14/21 2050 10/15/21 0428  BP: (!) 141/67 (!)  158/88  Pulse: 70 70  Resp: 16 18  Temp: 98.9 F (37.2 C) 98.1 F (36.7 C)  SpO2: 93% 90%   Vitals:   10/14/21 0459 10/14/21 1404 10/14/21 2050 10/15/21 0428  BP: (!) 148/86 (!) 149/75 (!) 141/67 (!) 158/88  Pulse: 70 69 70 70  Resp: 18 19 16 18   Temp: 98.2 F (36.8 C) 98.6 F (37 C) 98.9 F (37.2 C) 98.1 F (36.7 C)  TempSrc: Oral  Oral Axillary  SpO2: 97% (!) 86% 93% 90%  Weight:    100.9 kg  Height:        General exam: Appears calm and comfortable Respiratory system: Clear to auscultation. Respiratory effort normal. Cardiovascular system: S1 & S2 heard, RRR. No murmurs, rubs, gallops or clicks. Gastrointestinal system: Abdomen is nondistended, soft and nontender. No organomegaly or masses felt. Normal bowel sounds heard. Central nervous system: Alert and oriented. No focal neurological deficits. Musculoskeletal: BLE 1+ edema. No calf tenderness Skin: No cyanosis. No rashes Psychiatry: Judgement and insight appear normal. Mood & affect appropriate.     The results of significant diagnostics from this hospitalization (including imaging, microbiology, ancillary and laboratory) are listed below for reference.     Microbiology: Recent Results (from the past 240 hour(s))  Resp Panel by RT-PCR (Flu A&B, Covid) Nasopharyngeal Swab     Status: None   Collection Time: 10/08/21 10:02 PM   Specimen: Nasopharyngeal Swab; Nasopharyngeal(NP) swabs in vial transport medium  Result Value Ref Range Status   SARS Coronavirus 2 by RT PCR NEGATIVE NEGATIVE Final    Comment: (NOTE) SARS-CoV-2 target nucleic acids are NOT DETECTED.  The SARS-CoV-2 RNA is generally detectable in upper respiratory specimens during the acute phase of infection. The lowest concentration of SARS-CoV-2 viral copies this assay can detect is 138 copies/mL. A negative result does not preclude SARS-Cov-2 infection and should not be used as the sole basis for treatment or other patient management decisions. A  negative result may occur with  improper specimen collection/handling, submission of specimen other than nasopharyngeal swab, presence of viral mutation(s) within the areas targeted by this assay, and inadequate number of viral copies(<138 copies/mL). A negative result must be combined with clinical observations, patient history, and epidemiological information. The expected result is Negative.  Fact Sheet for Patients:  EntrepreneurPulse.com.au  Fact Sheet for Healthcare Providers:  IncredibleEmployment.be  This test is no t yet approved or cleared by the [place] FDA and  has been authorized for detection and/or diagnosis of SARS-CoV-2 by FDA under an Emergency Use Authorization (EUA). This EUA will remain  in effect (meaning this test can be used) for the duration of the COVID-19 declaration under Section 564(b)(1) of the Act, 21 U.S.C.section 360bbb-3(b)(1), unless the authorization is terminated  or revoked sooner.       Influenza A by PCR NEGATIVE NEGATIVE Final   Influenza  B by PCR NEGATIVE NEGATIVE Final    Comment: (NOTE) The Xpert Xpress SARS-CoV-2/FLU/RSV plus assay is intended as an aid in the diagnosis of influenza from Nasopharyngeal swab specimens and should not be used as a sole basis for treatment. Nasal washings and aspirates are unacceptable for Xpert Xpress SARS-CoV-2/FLU/RSV testing.  Fact Sheet for Patients: EntrepreneurPulse.com.au  Fact Sheet for Healthcare Providers: IncredibleEmployment.be  This test is not yet approved or cleared by the [place] FDA and has been authorized for detection and/or diagnosis of SARS-CoV-2 by FDA under an Emergency Use Authorization (EUA). This EUA will remain in effect (meaning this test can be used) for the duration of the COVID-19 declaration under Section 564(b)(1) of the Act, 21 U.S.C. section 360bbb-3(b)(1), unless the authorization  is terminated or revoked.  Performed at [facility], [place] [address], [place], [place] [address]   Blood culture (routine single)     Status: Abnormal   Collection Time: 10/08/21 10:11 PM   Specimen: BLOOD  Result Value Ref Range Status   Specimen Description   Final    BLOOD RIGHT ANTECUBITAL Performed at [facility] [address], [place], [place] [address]    Special Requests   Final    BOTTLES DRAWN AEROBIC AND ANAEROBIC Blood Culture adequate volume Performed at [facility] [address], [place], [place] [address]    Culture  Setup Time   Final    GRAM NEGATIVE RODS IN BOTH AEROBIC AND ANAEROBIC BOTTLES CRITICAL RESULT CALLED TO, READ BACK BY AND VERIFIED WITH: PHARMD LEIGHANN P. 10/10/21@2 :28 BY TW Performed at [facility], [place] [address], [place], [place] [address]    Culture PROTEUS MIRABILIS (A)  Final   Report Status 10/14/2021 FINAL  Final   Organism ID, Bacteria PROTEUS MIRABILIS  Final      Susceptibility   Proteus mirabilis - MIC*    AMPICILLIN <=2 SENSITIVE Sensitive     CEFAZOLIN <=4 SENSITIVE Sensitive     CEFEPIME <=0.12 SENSITIVE Sensitive     CEFTAZIDIME <=1 SENSITIVE Sensitive     CEFTRIAXONE <=0.25 SENSITIVE Sensitive     CIPROFLOXACIN <=0.25 SENSITIVE Sensitive     GENTAMICIN <=1 SENSITIVE Sensitive     IMIPENEM 2 SENSITIVE Sensitive     TRIMETH/SULFA <=20 SENSITIVE Sensitive     AMPICILLIN/SULBACTAM <=2 SENSITIVE Sensitive     PIP/TAZO <=4 SENSITIVE Sensitive     * PROTEUS MIRABILIS  Blood Culture ID Panel (Reflexed)     Status: Abnormal   Collection Time: 10/08/21 10:11 PM  Result Value Ref Range Status   Enterococcus faecalis NOT DETECTED NOT DETECTED Final   Enterococcus Faecium NOT DETECTED NOT DETECTED Final   Listeria monocytogenes NOT DETECTED NOT DETECTED Final   Staphylococcus species NOT DETECTED NOT DETECTED Final   Staphylococcus aureus (BCID) NOT DETECTED NOT  DETECTED Final   Staphylococcus epidermidis NOT DETECTED NOT DETECTED Final   Staphylococcus lugdunensis NOT DETECTED NOT DETECTED Final   Streptococcus species NOT DETECTED NOT DETECTED Final   Streptococcus agalactiae NOT DETECTED NOT DETECTED Final   Streptococcus pneumoniae NOT DETECTED NOT DETECTED Final   Streptococcus pyogenes NOT DETECTED NOT DETECTED Final   A.calcoaceticus-baumannii NOT DETECTED NOT DETECTED Final   Bacteroides fragilis NOT DETECTED NOT DETECTED Final   Enterobacterales DETECTED (A) NOT DETECTED Final    Comment: Enterobacterales represent a large order of gram negative bacteria, not a single organism. CRITICAL RESULT CALLED TO, READ BACK BY AND VERIFIED WITH: PHARMD LEIGHANN  P. 10/10/21@2 :28 BY TW    Enterobacter cloacae complex NOT DETECTED NOT DETECTED Final   Escherichia coli NOT DETECTED NOT DETECTED Final   Klebsiella aerogenes NOT DETECTED NOT DETECTED Final   Klebsiella oxytoca NOT DETECTED NOT DETECTED Final   Klebsiella pneumoniae NOT DETECTED NOT DETECTED Final   Proteus species DETECTED (A) NOT DETECTED Final    Comment: CRITICAL RESULT CALLED TO, READ BACK BY AND VERIFIED WITH: PHARMD LEIGHANN P. 10/10/21@2 :28 BY TW    Salmonella species NOT DETECTED NOT DETECTED Final   Serratia marcescens NOT DETECTED NOT DETECTED Final   Haemophilus influenzae NOT DETECTED NOT DETECTED Final   Neisseria meningitidis NOT DETECTED NOT DETECTED Final   Pseudomonas aeruginosa NOT DETECTED NOT DETECTED Final   Stenotrophomonas maltophilia NOT DETECTED NOT DETECTED Final   Candida albicans NOT DETECTED NOT DETECTED Final   Candida auris NOT DETECTED NOT DETECTED Final   Candida glabrata NOT DETECTED NOT DETECTED Final   Candida krusei NOT DETECTED NOT DETECTED Final   Candida parapsilosis NOT DETECTED NOT DETECTED Final   Candida tropicalis NOT DETECTED NOT DETECTED Final   Cryptococcus neoformans/gattii NOT DETECTED NOT DETECTED Final   CTX-M ESBL NOT DETECTED  NOT DETECTED Final   Carbapenem resistance IMP NOT DETECTED NOT DETECTED Final   Carbapenem resistance KPC NOT DETECTED NOT DETECTED Final   Carbapenem resistance NDM NOT DETECTED NOT DETECTED Final   Carbapenem resist OXA 48 LIKE NOT DETECTED NOT DETECTED Final   Carbapenem resistance VIM NOT DETECTED NOT DETECTED Final    Comment: Performed at [facility], [address] [place], [place] [address]  Urine Culture     Status: Abnormal   Collection Time: 10/08/21 10:22 PM   Specimen: In/Out Cath Urine  Result Value Ref Range Status   Specimen Description   Final    IN/OUT CATH URINE Performed at [facility], [place] [address] [place], [place] [address]    Special Requests   Final    NONE Performed at [facility], [place] [address], [place], [place] [address]    Culture 90,000 COLONIES/mL PROTEUS MIRABILIS (A)  Final   Report Status 10/10/2021 FINAL  Final   Organism ID, Bacteria PROTEUS MIRABILIS (A)  Final      Susceptibility   Proteus mirabilis - MIC*    AMPICILLIN <=2 SENSITIVE Sensitive     CEFAZOLIN <=4 SENSITIVE Sensitive     CEFEPIME <=0.12 SENSITIVE Sensitive     CEFTRIAXONE <=0.25 SENSITIVE Sensitive     CIPROFLOXACIN <=0.25 SENSITIVE Sensitive     GENTAMICIN <=1 SENSITIVE Sensitive     IMIPENEM 2 SENSITIVE Sensitive     NITROFURANTOIN 128 RESISTANT Resistant     TRIMETH/SULFA <=20 SENSITIVE Sensitive     AMPICILLIN/SULBACTAM <=2 SENSITIVE Sensitive     PIP/TAZO <=4 SENSITIVE Sensitive     * 90,000 COLONIES/mL PROTEUS MIRABILIS  MRSA Next Gen by PCR, Nasal     Status: None   Collection Time: 10/09/21  1:53 PM   Specimen: Nasal Mucosa; Nasal Swab  Result Value Ref Range Status   MRSA by PCR Next Gen NOT DETECTED NOT DETECTED Final    Comment: (NOTE) The GeneXpert MRSA Assay (FDA approved for NASAL specimens only), is one component of a comprehensive MRSA colonization surveillance program. It is not intended to diagnose MRSA  infection nor to guide or monitor treatment for MRSA infections. Test performance is not FDA approved in patients less than 17 years old. Performed at [facility], [place] [address], [place], [place] [address]   Resp Panel by RT-PCR (Flu A&B, Covid) Nasopharyngeal Swab     Status: None   Collection Time: 10/14/21 12:13 PM   Specimen: Nasopharyngeal Swab; Nasopharyngeal(NP) swabs in vial transport medium  Result Value Ref Range Status   SARS Coronavirus 2 by RT PCR NEGATIVE NEGATIVE Final    Comment: (NOTE) SARS-CoV-2 target nucleic acids are NOT DETECTED.  The SARS-CoV-2 RNA is generally detectable in upper respiratory specimens during the acute phase of infection. The lowest concentration of SARS-CoV-2 viral copies this assay can detect is 138 copies/mL. A negative result does not preclude SARS-Cov-2 infection and should not be used as the sole basis for treatment or other patient management decisions. A negative result may occur with  improper specimen collection/handling, submission of specimen other than nasopharyngeal swab, presence of viral mutation(s) within the areas targeted by this assay, and inadequate number of viral copies(<138 copies/mL). A negative result must be combined with clinical observations, patient history, and epidemiological information. The expected result is Negative.  Fact Sheet for Patients:  EntrepreneurPulse.com.au  Fact Sheet for Healthcare Providers:  IncredibleEmployment.be  This test is no t yet approved or cleared by the [place] FDA and  has been authorized for detection and/or diagnosis of SARS-CoV-2 by FDA under an Emergency Use Authorization (EUA). This EUA will remain  in effect (meaning this test can be used) for the duration of the COVID-19 declaration under Section 564(b)(1) of the Act, 21 U.S.C.section 360bbb-3(b)(1), unless the authorization is terminated  or revoked sooner.        Influenza A by PCR NEGATIVE NEGATIVE Final   Influenza B by PCR NEGATIVE NEGATIVE Final    Comment: (NOTE) The Xpert Xpress SARS-CoV-2/FLU/RSV plus assay is intended as an aid in the diagnosis of influenza from Nasopharyngeal swab specimens and should not be used as a sole basis for treatment. Nasal washings and aspirates are unacceptable for Xpert Xpress SARS-CoV-2/FLU/RSV testing.  Fact Sheet for Patients: EntrepreneurPulse.com.au  Fact Sheet for Healthcare Providers: IncredibleEmployment.be  This test is not yet approved or cleared by the [place] FDA and has been authorized for detection and/or diagnosis of SARS-CoV-2 by FDA under an Emergency Use Authorization (EUA). This EUA will remain in effect (meaning this test can be used) for the duration of the COVID-19 declaration under Section 564(b)(1) of the Act, 21 U.S.C. section 360bbb-3(b)(1), unless the authorization is terminated or revoked.  Performed at [facility], [place] [address] [place], [place] [address]      Labs: BNP (last 3 results) Recent Labs    01/29/21 0237  BNP 390.3*   Basic Metabolic Panel: Recent Labs  Lab 10/08/21 2202 10/09/21 0537 10/10/21 0252 10/11/21 0443 10/12/21 0449  NA 136 137 138 140 135  K 4.2 3.9 3.8 4.2 4.0  CL 100 107 108 109 105  CO2 28 25 23 22 22   GLUCOSE 96 109* 111* 120* 111*  BUN 33* 36* 34* 31* 28*  CREATININE 1.51* 1.77* 1.76* 1.40* 1.21  CALCIUM 9.2 7.8* 8.6* 9.0 8.2*  MG  --  1.9  --   --   --    Liver Function Tests: Recent Labs  Lab 10/08/21 2202 10/09/21 0537  AST 32 65*  ALT 33 58*  ALKPHOS 108 94  BILITOT 0.7 0.8  PROT 7.6 5.7*  ALBUMIN 4.3 3.2*   No results for input(s): LIPASE, AMYLASE in the last 168 hours. No results for input(s): AMMONIA in the  last 168 hours. CBC: Recent Labs  Lab 10/08/21 2202 10/09/21 0537 10/10/21 0252 10/11/21 0443 10/12/21 0449  WBC 9.7 19.6*  13.2* 11.7* 9.0  NEUTROABS 8.8*  --   --   --   --   HGB 12.1* 10.1* 10.5* 10.7* 9.9*  HCT 37.0* 31.2* 31.9* 31.9* 29.4*  MCV 101.4* 103.0* 100.0 100.0 99.0  PLT 107* 82* 80* 68* 63*   Cardiac Enzymes: No results for input(s): CKTOTAL, CKMB, CKMBINDEX, TROPONINI in the last 168 hours. BNP: Invalid input(s): POCBNP CBG: No results for input(s): GLUCAP in the last 168 hours. D-Dimer No results for input(s): DDIMER in the last 72 hours. Hgb A1c No results for input(s): HGBA1C in the last 72 hours. Lipid Profile No results for input(s): CHOL, HDL, LDLCALC, TRIG, CHOLHDL, LDLDIRECT in the last 72 hours. Thyroid function studies No results for input(s): TSH, T4TOTAL, T3FREE, THYROIDAB in the last 72 hours.  Invalid input(s): FREET3 Anemia work up No results for input(s): VITAMINB12, FOLATE, FERRITIN, TIBC, IRON, RETICCTPCT in the last 72 hours. Urinalysis    Component Value Date/Time   COLORURINE AMBER (A) 10/08/2021 2222   APPEARANCEUR CLOUDY (A) 10/08/2021 2222   LABSPEC 1.018 10/08/2021 2222   PHURINE 8.0 10/08/2021 2222   GLUCOSEU NEGATIVE 10/08/2021 2222   HGBUR NEGATIVE 10/08/2021 2222   BILIRUBINUR SMALL (A) 10/08/2021 2222   KETONESUR NEGATIVE 10/08/2021 2222   PROTEINUR 100 (A) 10/08/2021 2222   UROBILINOGEN 0.2 06/18/2015 0441   NITRITE NEGATIVE 10/08/2021 2222   LEUKOCYTESUR LARGE (A) 10/08/2021 2222   Sepsis Labs Invalid input(s): PROCALCITONIN,  WBC,  LACTICIDVEN Microbiology Recent Results (from the past 240 hour(s))  Resp Panel by RT-PCR (Flu A&B, Covid) Nasopharyngeal Swab     Status: None   Collection Time: 10/08/21 10:02 PM   Specimen: Nasopharyngeal Swab; Nasopharyngeal(NP) swabs in vial transport medium  Result Value Ref Range Status   SARS Coronavirus 2 by RT PCR NEGATIVE NEGATIVE Final    Comment: (NOTE) SARS-CoV-2 target nucleic acids are NOT DETECTED.  The SARS-CoV-2 RNA is generally detectable in upper respiratory specimens during the acute  phase of infection. The lowest concentration of SARS-CoV-2 viral copies this assay can detect is 138 copies/mL. A negative result does not preclude SARS-Cov-2 infection and should not be used as the sole basis for treatment or other patient management decisions. A negative result may occur with  improper specimen collection/handling, submission of specimen other than nasopharyngeal swab, presence of viral mutation(s) within the areas targeted by this assay, and inadequate number of viral copies(<138 copies/mL). A negative result must be combined with clinical observations, patient history, and epidemiological information. The expected result is Negative.  Fact Sheet for Patients:  EntrepreneurPulse.com.au  Fact Sheet for Healthcare Providers:  IncredibleEmployment.be  This test is no t yet approved or cleared by the [place] FDA and  has been authorized for detection and/or diagnosis of SARS-CoV-2 by FDA under an Emergency Use Authorization (EUA). This EUA will remain  in effect (meaning this test can be used) for the duration of the COVID-19 declaration under Section 564(b)(1) of the Act, 21 U.S.C.section 360bbb-3(b)(1), unless the authorization is terminated  or revoked sooner.       Influenza A by PCR NEGATIVE NEGATIVE Final   Influenza B by PCR NEGATIVE NEGATIVE Final    Comment: (NOTE) The Xpert Xpress SARS-CoV-2/FLU/RSV plus assay is intended as an aid in the diagnosis of influenza from Nasopharyngeal swab specimens and should not be used as a sole basis for treatment.  Nasal washings and aspirates are unacceptable for Xpert Xpress SARS-CoV-2/FLU/RSV testing.  Fact Sheet for Patients: EntrepreneurPulse.com.au  Fact Sheet for Healthcare Providers: IncredibleEmployment.be  This test is not yet approved or cleared by the [place] FDA and has been authorized for detection and/or diagnosis of  SARS-CoV-2 by FDA under an Emergency Use Authorization (EUA). This EUA will remain in effect (meaning this test can be used) for the duration of the COVID-19 declaration under Section 564(b)(1) of the Act, 21 U.S.C. section 360bbb-3(b)(1), unless the authorization is terminated or revoked.  Performed at [facility], [place] [address], [place], [place] [address]   Blood culture (routine single)     Status: Abnormal   Collection Time: 10/08/21 10:11 PM   Specimen: BLOOD  Result Value Ref Range Status   Specimen Description   Final    BLOOD RIGHT ANTECUBITAL Performed at [facility] [address], [place], [place] [address]    Special Requests   Final    BOTTLES DRAWN AEROBIC AND ANAEROBIC Blood Culture adequate volume Performed at [facility] [address] [place], [place] [address]    Culture  Setup Time   Final    GRAM NEGATIVE RODS IN BOTH AEROBIC AND ANAEROBIC BOTTLES CRITICAL RESULT CALLED TO, READ BACK BY AND VERIFIED WITH: PHARMD LEIGHANN P. 10/10/21@2 :28 BY TW Performed at [facility], [place] [address] [place], [place] [address]    Culture PROTEUS MIRABILIS (A)  Final   Report Status 10/14/2021 FINAL  Final   Organism ID, Bacteria PROTEUS MIRABILIS  Final      Susceptibility   Proteus mirabilis - MIC*    AMPICILLIN <=2 SENSITIVE Sensitive     CEFAZOLIN <=4 SENSITIVE Sensitive     CEFEPIME <=0.12 SENSITIVE Sensitive     CEFTAZIDIME <=1 SENSITIVE Sensitive     CEFTRIAXONE <=0.25 SENSITIVE Sensitive     CIPROFLOXACIN <=0.25 SENSITIVE Sensitive     GENTAMICIN <=1 SENSITIVE Sensitive     IMIPENEM 2 SENSITIVE Sensitive     TRIMETH/SULFA <=20 SENSITIVE Sensitive     AMPICILLIN/SULBACTAM <=2 SENSITIVE Sensitive     PIP/TAZO <=4 SENSITIVE Sensitive     * PROTEUS MIRABILIS  Blood Culture ID Panel (Reflexed)     Status: Abnormal   Collection Time: 10/08/21 10:11 PM  Result Value Ref Range Status    Enterococcus faecalis NOT DETECTED NOT DETECTED Final   Enterococcus Faecium NOT DETECTED NOT DETECTED Final   Listeria monocytogenes NOT DETECTED NOT DETECTED Final   Staphylococcus species NOT DETECTED NOT DETECTED Final   Staphylococcus aureus (BCID) NOT DETECTED NOT DETECTED Final   Staphylococcus epidermidis NOT DETECTED NOT DETECTED Final   Staphylococcus lugdunensis NOT DETECTED NOT DETECTED Final   Streptococcus species NOT DETECTED NOT DETECTED Final   Streptococcus agalactiae NOT DETECTED NOT DETECTED Final   Streptococcus pneumoniae NOT DETECTED NOT DETECTED Final   Streptococcus pyogenes NOT DETECTED NOT DETECTED Final   A.calcoaceticus-baumannii NOT DETECTED NOT DETECTED Final   Bacteroides fragilis NOT DETECTED NOT DETECTED Final   Enterobacterales DETECTED (A) NOT DETECTED Final    Comment: Enterobacterales represent a large order of gram negative bacteria, not a single organism. CRITICAL RESULT CALLED TO, READ BACK BY AND VERIFIED WITH: PHARMD LEIGHANN P. 10/10/21@2 :28 BY TW    Enterobacter cloacae complex NOT DETECTED NOT DETECTED Final   Escherichia coli NOT DETECTED NOT DETECTED Final   Klebsiella aerogenes NOT DETECTED NOT DETECTED Final   Klebsiella oxytoca NOT DETECTED NOT DETECTED Final  Klebsiella pneumoniae NOT DETECTED NOT DETECTED Final   Proteus species DETECTED (A) NOT DETECTED Final    Comment: CRITICAL RESULT CALLED TO, READ BACK BY AND VERIFIED WITH: PHARMD LEIGHANN P. 10/10/21@2 :28 BY TW    Salmonella species NOT DETECTED NOT DETECTED Final   Serratia marcescens NOT DETECTED NOT DETECTED Final   Haemophilus influenzae NOT DETECTED NOT DETECTED Final   Neisseria meningitidis NOT DETECTED NOT DETECTED Final   Pseudomonas aeruginosa NOT DETECTED NOT DETECTED Final   Stenotrophomonas maltophilia NOT DETECTED NOT DETECTED Final   Candida albicans NOT DETECTED NOT DETECTED Final   Candida auris NOT DETECTED NOT DETECTED Final   Candida glabrata NOT  DETECTED NOT DETECTED Final   Candida krusei NOT DETECTED NOT DETECTED Final   Candida parapsilosis NOT DETECTED NOT DETECTED Final   Candida tropicalis NOT DETECTED NOT DETECTED Final   Cryptococcus neoformans/gattii NOT DETECTED NOT DETECTED Final   CTX-M ESBL NOT DETECTED NOT DETECTED Final   Carbapenem resistance IMP NOT DETECTED NOT DETECTED Final   Carbapenem resistance KPC NOT DETECTED NOT DETECTED Final   Carbapenem resistance NDM NOT DETECTED NOT DETECTED Final   Carbapenem resist OXA 48 LIKE NOT DETECTED NOT DETECTED Final   Carbapenem resistance VIM NOT DETECTED NOT DETECTED Final    Comment: Performed at [facility], [address], [place], [place] [address]  Urine Culture     Status: Abnormal   Collection Time: 10/08/21 10:22 PM   Specimen: In/Out Cath Urine  Result Value Ref Range Status   Specimen Description   Final    IN/OUT CATH URINE Performed at [facility], [place] [address] [place], [place] [address]    Special Requests   Final    NONE Performed at [facility], [place] [address] [place], [place] [address]    Culture 90,000 COLONIES/mL PROTEUS MIRABILIS (A)  Final   Report Status 10/10/2021 FINAL  Final   Organism ID, Bacteria PROTEUS MIRABILIS (A)  Final      Susceptibility   Proteus mirabilis - MIC*    AMPICILLIN <=2 SENSITIVE Sensitive     CEFAZOLIN <=4 SENSITIVE Sensitive     CEFEPIME <=0.12 SENSITIVE Sensitive     CEFTRIAXONE <=0.25 SENSITIVE Sensitive     CIPROFLOXACIN <=0.25 SENSITIVE Sensitive     GENTAMICIN <=1 SENSITIVE Sensitive     IMIPENEM 2 SENSITIVE Sensitive     NITROFURANTOIN 128 RESISTANT Resistant     TRIMETH/SULFA <=20 SENSITIVE Sensitive     AMPICILLIN/SULBACTAM <=2 SENSITIVE Sensitive     PIP/TAZO <=4 SENSITIVE Sensitive     * 90,000 COLONIES/mL PROTEUS MIRABILIS  MRSA Next Gen by PCR, Nasal     Status: None   Collection Time: 10/09/21  1:53 PM   Specimen: Nasal Mucosa; Nasal Swab   Result Value Ref Range Status   MRSA by PCR Next Gen NOT DETECTED NOT DETECTED Final    Comment: (NOTE) The GeneXpert MRSA Assay (FDA approved for NASAL specimens only), is one component of a comprehensive MRSA colonization surveillance program. It is not intended to diagnose MRSA infection nor to guide or monitor treatment for MRSA infections. Test performance is not FDA approved in patients less than 84 years old. Performed at [facility], [place] [address] [place], [place] [address]   Resp Panel by RT-PCR (Flu A&B, Covid) Nasopharyngeal Swab     Status: None   Collection Time: 10/14/21 12:13 PM   Specimen: Nasopharyngeal Swab; Nasopharyngeal(NP) swabs in vial transport medium  Result Value Ref Range Status   SARS Coronavirus 2 by RT PCR NEGATIVE NEGATIVE Final    Comment: (NOTE) SARS-CoV-2 target nucleic acids are NOT DETECTED.  The SARS-CoV-2 RNA is generally detectable in upper respiratory specimens during the acute phase of infection. The lowest concentration of SARS-CoV-2 viral copies this assay can detect is 138 copies/mL. A negative result does not preclude SARS-Cov-2 infection and should not be used as the sole basis for treatment or other patient management decisions. A negative result may occur with  improper specimen collection/handling, submission of specimen other than nasopharyngeal swab, presence of viral mutation(s) within the areas targeted by this assay, and inadequate number of viral copies(<138 copies/mL). A negative result must be combined with clinical observations, patient history, and epidemiological information. The expected result is Negative.  Fact Sheet for Patients:  EntrepreneurPulse.com.au  Fact Sheet for Healthcare Providers:  IncredibleEmployment.be  This test is no t yet approved or cleared by the [place] FDA and  has been authorized for detection and/or diagnosis of SARS-CoV-2  by FDA under an Emergency Use Authorization (EUA). This EUA will remain  in effect (meaning this test can be used) for the duration of the COVID-19 declaration under Section 564(b)(1) of the Act, 21 U.S.C.section 360bbb-3(b)(1), unless the authorization is terminated  or revoked sooner.       Influenza A by PCR NEGATIVE NEGATIVE Final   Influenza B by PCR NEGATIVE NEGATIVE Final    Comment: (NOTE) The Xpert Xpress SARS-CoV-2/FLU/RSV plus assay is intended as an aid in the diagnosis of influenza from Nasopharyngeal swab specimens and should not be used as a sole basis for treatment. Nasal washings and aspirates are unacceptable for Xpert Xpress SARS-CoV-2/FLU/RSV testing.  Fact Sheet for Patients: EntrepreneurPulse.com.au  Fact Sheet for Healthcare Providers: IncredibleEmployment.be  This test is not yet approved or cleared by the [place] FDA and has been authorized for detection and/or diagnosis of SARS-CoV-2 by FDA under an Emergency Use Authorization (EUA). This EUA will remain in effect (meaning this test can be used) for the duration of the COVID-19 declaration under Section 564(b)(1) of the Act, 21 U.S.C. section 360bbb-3(b)(1), unless the authorization is terminated or revoked.  Performed at [facility], [place] [address], [place], [place] [address]      Time coordinating discharge: 35 minutes  SIGNED:   Cordelia Poche, MD [facility] 10/15/2021, 7:44 AM

## 2021-10-15 NOTE — Progress Notes (Signed)
[facility] for Warfarin Indication: atrial fibrillation  Allergies  Allergen Reactions   Sulfa Antibiotics Other (See Comments)    Weakness, dizziness   Primidone     Other reaction(s): increased sedation   Simvastatin     Other reaction(s): arthralgias and fatigue, severe   Patient Measurements: Height: 5' 9.5" (176.5 cm) Weight: 100.9 kg (222 lb 7.1 oz) IBW/kg (Calculated) : 71.85  Vital Signs: Temp: 98.1 F (36.7 C) (10/17 0428) Temp Source: Axillary (10/17 0428) BP: 158/88 (10/17 0428) Pulse Rate: 70 (10/17 0428)  Labs: Recent Labs    10/13/21 0532 10/14/21 0447  LABPROT 27.6* 28.2*  INR 2.6* 2.6*    Estimated Creatinine Clearance: 44.1 mL/min (by C-G formula based on SCr of 1.21 mg/dL).  Medications: PTA Warfarin Home dose: Warfarin 7.5 mg PO daily except 10 mg on Fridays [place]ase dose PTA: 10/10  Assessment: 87 yr male admitted with sepsis, found to have UTI/bacteremia, currently on antibiotic regimen. PMH significant for atrial fibrillation for which pt is prescribed warfarin; history of thrombocytopenia noted. INR was therapeutic on admission.   10/15/21, Today INR has remained therapeutic x4 days on antibiotic course. INR checks reduced from daily to 3x/week. No INR this morning. CBC (10/14): Hgb low but stable; Plt low  No significant drug interactions with warfarin  Goal of Therapy:  INR 2-3   Plan:  Continue home dose of  warfarin 7.5 mg po daily except 10 mg on Fridays INR check 3x/week on TTS CBC with AM labs tomorrow  At this time, would recommend continuation of home dose of warfarin at discharge with INR check within a week of discharge.  Lenis Noon, PharmD 10/15/21 8:16 AM

## 2021-10-15 NOTE — Progress Notes (Signed)
Patient will be discharging via [facility] today. Patient belongings were returned. Education on medication will be provided. Report will be called in to receiving facility.

## 2021-10-17 ENCOUNTER — Ambulatory Visit (INDEPENDENT_AMBULATORY_CARE_PROVIDER_SITE_OTHER): Payer: Medicare Other

## 2021-10-17 ENCOUNTER — Telehealth: Payer: Self-pay | Admitting: Interventional Cardiology

## 2021-10-17 DIAGNOSIS — I480 Paroxysmal atrial fibrillation: Secondary | ICD-10-CM | POA: Diagnosis not present

## 2021-10-17 DIAGNOSIS — E039 Hypothyroidism, unspecified: Secondary | ICD-10-CM | POA: Diagnosis not present

## 2021-10-17 DIAGNOSIS — N401 Enlarged prostate with lower urinary tract symptoms: Secondary | ICD-10-CM | POA: Diagnosis not present

## 2021-10-17 DIAGNOSIS — G250 Essential tremor: Secondary | ICD-10-CM | POA: Diagnosis not present

## 2021-10-17 DIAGNOSIS — N1831 Chronic kidney disease, stage 3a: Secondary | ICD-10-CM | POA: Diagnosis not present

## 2021-10-17 DIAGNOSIS — Z8673 Personal history of transient ischemic attack (TIA), and cerebral infarction without residual deficits: Secondary | ICD-10-CM | POA: Diagnosis not present

## 2021-10-17 DIAGNOSIS — N390 Urinary tract infection, site not specified: Secondary | ICD-10-CM | POA: Diagnosis not present

## 2021-10-17 DIAGNOSIS — I639 Cerebral infarction, unspecified: Secondary | ICD-10-CM

## 2021-10-17 DIAGNOSIS — I63412 Cerebral infarction due to embolism of left middle cerebral artery: Secondary | ICD-10-CM

## 2021-10-17 DIAGNOSIS — I5042 Chronic combined systolic (congestive) and diastolic (congestive) heart failure: Secondary | ICD-10-CM | POA: Diagnosis not present

## 2021-10-17 DIAGNOSIS — I495 Sick sinus syndrome: Secondary | ICD-10-CM | POA: Diagnosis not present

## 2021-10-17 DIAGNOSIS — D696 Thrombocytopenia, unspecified: Secondary | ICD-10-CM | POA: Diagnosis not present

## 2021-10-17 LAB — POCT INR: INR: 1.4 — AB (ref 2.0–3.0)

## 2021-10-17 NOTE — Telephone Encounter (Signed)
Stephanie with [facility] is calling to report pts INR level to coumadin clinic, and obtain orders.  Per Colletta Maryland, pts INR was 1.4 today. Colletta Maryland provided her cell number for Coumadin Clinic to call her back at at [phone]. Informed Colletta Maryland I will go and speak with Coumadin clinic now and have them call her right back to address pts INR level and coumadin dosing.

## 2021-10-17 NOTE — Telephone Encounter (Signed)
Returned call to [place] at [facility], INR addressed see anticoagulation note in Epic.

## 2021-10-17 NOTE — Patient Instructions (Signed)
Description   Spoke with Colletta Maryland from [facility], advised to have pt start taking warfarin 1.5 tablets daily. Recheck INR on Monday. Colletta Maryland at [facility] cell [phone].  Coumadin Clinic [phone].

## 2021-10-17 NOTE — Telephone Encounter (Signed)
STEPHANIE FROM [facility] IS CALLING WITH INR RESULTS FOR THIS PT

## 2021-10-17 NOTE — Telephone Encounter (Signed)
Coumadin clinic made aware to contact [facility] with [facility] re[facility], on her cell phone, to receive pts INR level from today.  Coumadin clinic will contact her now.

## 2021-10-18 ENCOUNTER — Ambulatory Visit (INDEPENDENT_AMBULATORY_CARE_PROVIDER_SITE_OTHER): Payer: Medicare Other

## 2021-10-18 DIAGNOSIS — I639 Cerebral infarction, unspecified: Secondary | ICD-10-CM

## 2021-10-18 DIAGNOSIS — N1831 Chronic kidney disease, stage 3a: Secondary | ICD-10-CM | POA: Diagnosis not present

## 2021-10-18 DIAGNOSIS — I495 Sick sinus syndrome: Secondary | ICD-10-CM | POA: Diagnosis not present

## 2021-10-18 DIAGNOSIS — E039 Hypothyroidism, unspecified: Secondary | ICD-10-CM | POA: Diagnosis not present

## 2021-10-18 DIAGNOSIS — G250 Essential tremor: Secondary | ICD-10-CM | POA: Diagnosis not present

## 2021-10-18 DIAGNOSIS — Z8673 Personal history of transient ischemic attack (TIA), and cerebral infarction without residual deficits: Secondary | ICD-10-CM | POA: Diagnosis not present

## 2021-10-18 DIAGNOSIS — I5032 Chronic diastolic (congestive) heart failure: Secondary | ICD-10-CM | POA: Diagnosis not present

## 2021-10-18 DIAGNOSIS — D696 Thrombocytopenia, unspecified: Secondary | ICD-10-CM | POA: Diagnosis not present

## 2021-10-18 DIAGNOSIS — I480 Paroxysmal atrial fibrillation: Secondary | ICD-10-CM | POA: Diagnosis not present

## 2021-10-18 LAB — CUP PACEART REMOTE DEVICE CHECK
Battery Remaining Longevity: 36 mo
Battery Voltage: 2.94 V
Brady Statistic AP VP Percent: 99.84 %
Brady Statistic AP VS Percent: 0 %
Brady Statistic AS VP Percent: 0 %
Brady Statistic AS VS Percent: 0.16 %
Brady Statistic RA Percent Paced: 99.99 %
Brady Statistic RV Percent Paced: 99.84 %
Date Time Interrogation Session: 20221019161739
Implantable Lead Implant Date: 20191206
Implantable Lead Implant Date: 20191206
Implantable Lead Location: 753860
Implantable Lead Location: 753860
Implantable Lead Model: 3830
Implantable Lead Model: 5076
Implantable Pulse Generator Implant Date: 20191206
Lead Channel Impedance Value: 285 Ohm
Lead Channel Impedance Value: 285 Ohm
Lead Channel Impedance Value: 361 Ohm
Lead Channel Impedance Value: 399 Ohm
Lead Channel Sensing Intrinsic Amplitude: 2.875 mV
Lead Channel Sensing Intrinsic Amplitude: 3.75 mV
Lead Channel Sensing Intrinsic Amplitude: 3.75 mV
Lead Channel Sensing Intrinsic Amplitude: 4.25 mV
Lead Channel Setting Pacing Amplitude: 2 V
Lead Channel Setting Pacing Amplitude: 2.5 V
Lead Channel Setting Pacing Pulse Width: 0.4 ms
Lead Channel Setting Sensing Sensitivity: 1.2 mV

## 2021-10-19 DIAGNOSIS — N390 Urinary tract infection, site not specified: Secondary | ICD-10-CM | POA: Diagnosis not present

## 2021-10-19 DIAGNOSIS — D696 Thrombocytopenia, unspecified: Secondary | ICD-10-CM | POA: Diagnosis not present

## 2021-10-19 DIAGNOSIS — I480 Paroxysmal atrial fibrillation: Secondary | ICD-10-CM | POA: Diagnosis not present

## 2021-10-19 DIAGNOSIS — G250 Essential tremor: Secondary | ICD-10-CM | POA: Diagnosis not present

## 2021-10-19 DIAGNOSIS — I5042 Chronic combined systolic (congestive) and diastolic (congestive) heart failure: Secondary | ICD-10-CM | POA: Diagnosis not present

## 2021-10-19 DIAGNOSIS — I495 Sick sinus syndrome: Secondary | ICD-10-CM | POA: Diagnosis not present

## 2021-10-19 DIAGNOSIS — N401 Enlarged prostate with lower urinary tract symptoms: Secondary | ICD-10-CM | POA: Diagnosis not present

## 2021-10-19 DIAGNOSIS — N1831 Chronic kidney disease, stage 3a: Secondary | ICD-10-CM | POA: Diagnosis not present

## 2021-10-19 DIAGNOSIS — E039 Hypothyroidism, unspecified: Secondary | ICD-10-CM | POA: Diagnosis not present

## 2021-10-22 ENCOUNTER — Ambulatory Visit (INDEPENDENT_AMBULATORY_CARE_PROVIDER_SITE_OTHER): Payer: Medicare Other

## 2021-10-22 DIAGNOSIS — N401 Enlarged prostate with lower urinary tract symptoms: Secondary | ICD-10-CM | POA: Diagnosis not present

## 2021-10-22 DIAGNOSIS — I5042 Chronic combined systolic (congestive) and diastolic (congestive) heart failure: Secondary | ICD-10-CM | POA: Diagnosis not present

## 2021-10-22 DIAGNOSIS — I480 Paroxysmal atrial fibrillation: Secondary | ICD-10-CM

## 2021-10-22 DIAGNOSIS — N390 Urinary tract infection, site not specified: Secondary | ICD-10-CM | POA: Diagnosis not present

## 2021-10-22 DIAGNOSIS — I495 Sick sinus syndrome: Secondary | ICD-10-CM | POA: Diagnosis not present

## 2021-10-22 DIAGNOSIS — Z5181 Encounter for therapeutic drug level monitoring: Secondary | ICD-10-CM | POA: Diagnosis not present

## 2021-10-22 DIAGNOSIS — D696 Thrombocytopenia, unspecified: Secondary | ICD-10-CM | POA: Diagnosis not present

## 2021-10-22 DIAGNOSIS — Z8673 Personal history of transient ischemic attack (TIA), and cerebral infarction without residual deficits: Secondary | ICD-10-CM | POA: Diagnosis not present

## 2021-10-22 DIAGNOSIS — G250 Essential tremor: Secondary | ICD-10-CM | POA: Diagnosis not present

## 2021-10-22 DIAGNOSIS — E039 Hypothyroidism, unspecified: Secondary | ICD-10-CM | POA: Diagnosis not present

## 2021-10-22 LAB — POCT INR: INR: 1.4 — AB (ref 2.0–3.0)

## 2021-10-22 NOTE — Patient Instructions (Signed)
Spoke with Colletta Maryland from [facility], advised to have pt take 2 tablets warfarin tonight and tomorrow, then resume taking warfarin 1.5 tablets daily. Recheck INR on Monday. Colletta Maryland at [facility] cell [phone].  Coumadin Clinic [phone].

## 2021-10-24 DIAGNOSIS — E039 Hypothyroidism, unspecified: Secondary | ICD-10-CM | POA: Diagnosis not present

## 2021-10-24 DIAGNOSIS — I5042 Chronic combined systolic (congestive) and diastolic (congestive) heart failure: Secondary | ICD-10-CM | POA: Diagnosis not present

## 2021-10-24 DIAGNOSIS — N390 Urinary tract infection, site not specified: Secondary | ICD-10-CM | POA: Diagnosis not present

## 2021-10-24 DIAGNOSIS — I480 Paroxysmal atrial fibrillation: Secondary | ICD-10-CM | POA: Diagnosis not present

## 2021-10-24 DIAGNOSIS — N1831 Chronic kidney disease, stage 3a: Secondary | ICD-10-CM | POA: Diagnosis not present

## 2021-10-24 DIAGNOSIS — D696 Thrombocytopenia, unspecified: Secondary | ICD-10-CM | POA: Diagnosis not present

## 2021-10-24 DIAGNOSIS — G250 Essential tremor: Secondary | ICD-10-CM | POA: Diagnosis not present

## 2021-10-24 DIAGNOSIS — Z8673 Personal history of transient ischemic attack (TIA), and cerebral infarction without residual deficits: Secondary | ICD-10-CM | POA: Diagnosis not present

## 2021-10-24 DIAGNOSIS — I495 Sick sinus syndrome: Secondary | ICD-10-CM | POA: Diagnosis not present

## 2021-10-24 DIAGNOSIS — N401 Enlarged prostate with lower urinary tract symptoms: Secondary | ICD-10-CM | POA: Diagnosis not present

## 2021-10-25 DIAGNOSIS — R338 Other retention of urine: Secondary | ICD-10-CM | POA: Diagnosis not present

## 2021-10-25 DIAGNOSIS — N3021 Other chronic cystitis with hematuria: Secondary | ICD-10-CM | POA: Diagnosis not present

## 2021-10-26 DIAGNOSIS — I495 Sick sinus syndrome: Secondary | ICD-10-CM | POA: Diagnosis not present

## 2021-10-26 DIAGNOSIS — D696 Thrombocytopenia, unspecified: Secondary | ICD-10-CM | POA: Diagnosis not present

## 2021-10-26 DIAGNOSIS — N1831 Chronic kidney disease, stage 3a: Secondary | ICD-10-CM | POA: Diagnosis not present

## 2021-10-26 DIAGNOSIS — N390 Urinary tract infection, site not specified: Secondary | ICD-10-CM | POA: Diagnosis not present

## 2021-10-26 DIAGNOSIS — Z8673 Personal history of transient ischemic attack (TIA), and cerebral infarction without residual deficits: Secondary | ICD-10-CM | POA: Diagnosis not present

## 2021-10-26 DIAGNOSIS — I480 Paroxysmal atrial fibrillation: Secondary | ICD-10-CM | POA: Diagnosis not present

## 2021-10-26 DIAGNOSIS — I5042 Chronic combined systolic (congestive) and diastolic (congestive) heart failure: Secondary | ICD-10-CM | POA: Diagnosis not present

## 2021-10-26 DIAGNOSIS — G250 Essential tremor: Secondary | ICD-10-CM | POA: Diagnosis not present

## 2021-10-26 DIAGNOSIS — N401 Enlarged prostate with lower urinary tract symptoms: Secondary | ICD-10-CM | POA: Diagnosis not present

## 2021-10-26 NOTE — Progress Notes (Signed)
Remote pacemaker transmission.   

## 2021-10-29 ENCOUNTER — Telehealth: Payer: Self-pay | Admitting: Adult Health

## 2021-10-29 ENCOUNTER — Encounter: Payer: Self-pay | Admitting: Adult Health

## 2021-10-29 ENCOUNTER — Encounter: Payer: Self-pay | Admitting: Physician Assistant

## 2021-10-29 ENCOUNTER — Ambulatory Visit (INDEPENDENT_AMBULATORY_CARE_PROVIDER_SITE_OTHER): Payer: Medicare Other | Admitting: Adult Health

## 2021-10-29 VITALS — BP 137/63 | HR 69 | Ht 69.5 in | Wt 202.2 lb

## 2021-10-29 DIAGNOSIS — I63512 Cerebral infarction due to unspecified occlusion or stenosis of left middle cerebral artery: Secondary | ICD-10-CM | POA: Diagnosis not present

## 2021-10-29 DIAGNOSIS — I639 Cerebral infarction, unspecified: Secondary | ICD-10-CM

## 2021-10-29 DIAGNOSIS — G609 Hereditary and idiopathic neuropathy, unspecified: Secondary | ICD-10-CM | POA: Diagnosis not present

## 2021-10-29 DIAGNOSIS — G250 Essential tremor: Secondary | ICD-10-CM | POA: Diagnosis not present

## 2021-10-29 MED ORDER — PREGABALIN 25 MG PO CAPS
50.0000 mg | ORAL_CAPSULE | Freq: Every day | ORAL | 1 refills | Status: DC
Start: 2021-10-29 — End: 2022-08-12

## 2021-10-29 NOTE — Telephone Encounter (Signed)
I called and spoke to [facility] , pharmacist at [facility] and I relayed that pregabalin is 50mg   (2 of 25mg  caps) po qhs #180 and one refill.  She verbalized understanding.

## 2021-10-29 NOTE — Telephone Encounter (Signed)
Estill Bamberg from [facility] called needing to clear up the instructions on the pt's pregabalin (LYRICA) 25 MG capsule Please advise.

## 2021-10-29 NOTE — Telephone Encounter (Signed)
Lyrica 50 mg at bedtime

## 2021-10-29 NOTE — Progress Notes (Signed)
PATIENT: Douglas Walker DOB: Mar 05, 1925  REASON FOR VISIT: follow up HISTORY FROM: patient   HISTORY OF PRESENT ILLNESS: Today 10/29/21: Douglas Walker is a 87 year old male with a history of left middle cerebral artery stroke, essential tremor and peripheral neuropathy.  He returns today for follow-up.  He is currently back on Coumadin and tolerating it well.  He feels that his tremors have gotten worse.  He notices the tremor in the right hand and in the jaw.  Currently on primidone 250 mg twice a day and Topamax 50 mg twice a day.  He reports burning sensations in the feet primarily at night.  He is currently taking Lyrica 25 mg twice a day.  He uses a walker when ambulating.  He was recently hospitalized for sepsis.  He was discharged to rehab at [facility].  He is returning home today from [facility].   HISTORY Douglas Walker is a 87 year old right-handed white male with a history of atrial fibrillation, he has a pacemaker in place.  He has an essential tremor and a peripheral neuropathy with a gait disorder associated with this.  He went into the hospital on 28 January 2021 with onset of a left middle cerebral artery distribution stroke.  The patient presented with aphasia and confusion.  The patient has had difficulty with swallowing, he was followed through speech therapy for a time.  The patient underwent inpatient rehab and then returned home with home health therapy that has since ended.  The patient has regained most of his speech but his speech has never fully returned to normal, he still has some word finding problems.  A CT perfusion study suggested a left middle cerebral artery distribution stroke.  The patient was off of Coumadin because of bleeding complications following surgery on the back, the patient had recurring hematoma.  This surgical site however has now healed.  The patient was placed on Eliquis in the hospital, but he has remained on primidone 250 mg twice daily, he  also takes Lyrica for his peripheral neuropathy and Topamax for his tremor as well.  The patient is sent to this office for further evaluation.  He is at home living with his family, the note that he is fairly independent.  He is able to bathe himself, do his toiletries, and dress himself.  The patient has occasional constipation, his bladder is working fairly well.  He reports no other new numbness or weakness of the arms or legs.  He walks with a walker, he has not had any falls, he is safe with ambulation.   REVIEW OF SYSTEMS: Out of a complete 14 system review of symptoms, the patient complains only of the following symptoms, and all other reviewed systems are negative.  See HPI  ALLERGIES: Allergies  Allergen Reactions   Sulfa Antibiotics Other (See Comments)    Weakness, dizziness   Primidone     Other reaction(s): increased sedation   Simvastatin     Other reaction(s): arthralgias and fatigue, severe    HOME MEDICATIONS: Outpatient Medications Prior to Visit  Medication Sig Dispense Refill   acetaminophen (TYLENOL) 325 MG tablet Take 2 tablets (650 mg total) by mouth every 4 (four) hours as needed for mild pain (or temp > 37.5 C (99.5 F)).     Ascorbic Acid (VITAMIN C) 1000 MG tablet Take 1,000 mg by mouth daily.     cephALEXin (KEFLEX) 250 MG capsule Take 250 mg by mouth daily.     DULoxetine (  CYMBALTA) 60 MG capsule Take 1 capsule (60 mg total) by mouth daily. 30 capsule 11   furosemide (LASIX) 20 MG tablet Take 2 tablets (40 mg total) by mouth daily. 60 tablet 0   levothyroxine (SYNTHROID) 25 MCG tablet Take 1 tablet (25 mcg total) by mouth daily before breakfast. 90 tablet 1   loratadine (CLARITIN) 10 MG tablet Take 10 mg by mouth daily.     Multiple Vitamin (MULTIVITAMIN WITH MINERALS) TABS tablet Take 1 tablet by mouth daily.     Multiple Vitamins-Minerals (OCUVITE ADULT 50+) CAPS Take 1 capsule by mouth daily.     phenazopyridine (PYRIDIUM) 200 MG tablet Take 200 mg by  mouth See admin instructions. 200mg  oral at night And 200mg  extra twice daily as needed for bladder spasms     polyethylene glycol (MIRALAX / GLYCOLAX) 17 g packet Take 17 g by mouth daily. (Patient taking differently: Take 17 g by mouth daily as needed for mild constipation.) 30 each 0   pregabalin (LYRICA) 25 MG capsule TAKE 1 CAPSULE(25 MG) BY MOUTH AT BEDTIME (Patient taking differently: Take 25 mg by mouth at bedtime.) 90 capsule 1   primidone (MYSOLINE) 250 MG tablet Take 1 tablet (250 mg total) by mouth 2 (two) times daily. 60 tablet 11   topiramate (TOPAMAX) 50 MG tablet Take 1 tablet (50 mg total) by mouth 2 (two) times daily. 180 tablet 3   vitamin B-12 (CYANOCOBALAMIN) 1000 MCG tablet Take 1 tablet (1,000 mcg total) by mouth every other day. (Patient taking differently: Take 1,000 mcg by mouth See admin instructions. 1074mcg oral daily on Sunday, Tuesday, Wednesday, Thursday, repeat.) 30 tablet 0   warfarin (COUMADIN) 5 MG tablet Take 1 to 1.5 tablets daily as directed by Coumadin clinic (Patient taking differently: Take 7.5-10 mg by mouth See admin instructions. 7.5mg  on everyday except Friday. Friday take 10mg ) 45 tablet 3   nitrofurantoin, macrocrystal-monohydrate, (MACROBID) 100 MG capsule Take 100 mg by mouth daily. (Patient not taking: Reported on 10/29/2021)     No facility-administered medications prior to visit.    PAST MEDICAL HISTORY: Past Medical History:  Diagnosis Date   Arthritis    shoulders and back   Cancer ([place])    skin cancers   Chronic low back pain 12/15/2017   CKD (chronic kidney disease) stage 3, GFR 30-59 ml/min (HCC)    Coronary artery disease    Dysrhythmia     Paroxysmal atrial fibrillation; Tachycardia-bradycardia syndrome   Essential tremor 02/28/2016   GERD (gastroesophageal reflux disease)    Hernia    History of hiatal hernia    Hyperlipemia    Hypertension    Macular degeneration    Neuromuscular disorder ([place])    neuropathy   Paroxysmal  atrial fibrillation ([place]) 10/28/2013   Presence of permanent cardiac pacemaker 12/04/2018   Stroke ([place]) 12/2020   Tachycardia-bradycardia syndrome ([place]) 09/13/2014   TIA (transient ischemic attack)     PAST SURGICAL HISTORY: Past Surgical History:  Procedure Laterality Date   [place] / REPLACE / REMOVE PACEMAKER  12/04/2018   IRRIGATION AND DEBRIDEMENT ABSCESS Right 07/24/2020   Procedure: IRRIGATION AND DEBRIDEMENT HEMATOMA;  Surgeon: Coralie Keens, MD;  Location: [place];  Service: General;  Laterality: Right;   MASS EXCISION Right 07/20/2020   Procedure: EXCISION OF RIGHT CHEST WALL MASS;  Surgeon: Stark Klein, MD;  Location: [place];  Service: General;  Laterality: Right;   PACEMAKER IMPLANT N/A 12/04/2018   Procedure: PACEMAKER IMPLANT;  Surgeon: Evans Lance, MD;  Location: [place] [facility];  Service: Cardiovascular;  Laterality: N/A;    FAMILY HISTORY: Family History  Problem Relation Age of Onset   GI problems Mother    Other Sister        PAIN ISSUES   Hearing loss Sister    Blindness Sister    Heart attack Neg Hx    Stroke Neg Hx     SOCIAL HISTORY: Social History   Socioeconomic History   Marital status: Widowed    Spouse name: Not on file   Number of children: 1   Years of education: 10   Highest education level: Not on file  Occupational History    Comment: retired  Tobacco Use   Smoking status: Former    Types: Cigarettes    Quit date: 12/30/1962    Years since quitting: 58.8   Smokeless tobacco: Never  Vaping Use   Vaping Use: Never used  Substance and Sexual Activity   Alcohol use: No   Drug use: No   Sexual activity: Not Currently  Other Topics Concern   Not on file  Social History Narrative   Lives alone   Right-handed   Caffeine use- caffeine free coffee, occas soda   Social Determinants of Health   Financial Resource Strain: Not on file  Food Insecurity: Not on  file  Transportation Needs: Not on file  Physical Activity: Not on file  Stress: Not on file  Social Connections: Not on file  Intimate Partner Violence: Not on file      PHYSICAL EXAM  Vitals:   10/29/21 1131  BP: 137/63  Pulse: 69  Weight: 202 lb 3.2 oz (91.7 kg)  Height: 5' 9.5" (1.765 m)   Body mass index is 29.43 kg/m.  Generalized: Well developed, in no acute distress   Neurological examination  Mentation: Alert oriented to time, place, history taking. Follows all commands speech and language fluent Cranial nerve II-XII: Pupils were equal round reactive to light. Extraocular movements were full, visual field were full on confrontational test. Facial sensation and strength were normal. Uvula tongue midline. Head turning and shoulder shrug  were normal and symmetric.  Tremor noted in jaw Motor: The motor testing reveals 5 over 5 strength of all 4 extremities. Good symmetric motor tone is noted throughout.  Sensory: Sensory testing is intact to soft touch on all 4 extremities. No evidence of extinction is noted.  Coordination: Cerebellar testing reveals good finger-nose-finger and heel-to-shin bilaterally.  Mild tremor noted in the right extremity Gait and station: Uses a walker when ambulating Reflexes: Deep tendon reflexes are symmetric and normal bilaterally.   DIAGNOSTIC DATA (LABS, IMAGING, TESTING) - I reviewed patient records, labs, notes, testing and imaging myself where available.  Lab Results  Component Value Date   WBC 9.0 10/12/2021   HGB 9.9 (L) 10/12/2021   HCT 29.4 (L) 10/12/2021   MCV 99.0 10/12/2021   PLT 63 (L) 10/12/2021      Component Value Date/Time   NA 135 10/12/2021 0449   NA 140 01/28/2019 1112   K 4.0 10/12/2021 0449   CL 105 10/12/2021 0449   CO2 22 10/12/2021 0449   GLUCOSE 111 (H) 10/12/2021 0449   BUN 28 (H) 10/12/2021 0449   BUN 35 01/28/2019 1112   CREATININE 1.21 10/12/2021 0449   CALCIUM 8.2 (L) 10/12/2021 0449   PROT  5.7  (L) 10/09/2021 0537   ALBUMIN 3.2 (L) 10/09/2021 0537   AST 65 (H) 10/09/2021 0537   ALT 58 (H) 10/09/2021 0537   ALKPHOS 94 10/09/2021 0537   BILITOT 0.8 10/09/2021 0537   GFRNONAA 55 (L) 10/12/2021 0449   GFRAA 45 (L) 08/24/2020 1123   Lab Results  Component Value Date   CHOL 188 01/29/2021   HDL 43 01/29/2021   LDLCALC 126 (H) 01/29/2021   TRIG 96 01/29/2021   CHOLHDL 4.4 01/29/2021   Lab Results  Component Value Date   HGBA1C 5.3 01/29/2021   No results found for: VITAMINB12 Lab Results  Component Value Date   TSH 3.564 08/24/2020      ASSESSMENT AND PLAN 87 y.o. year old male  has a past medical history of Arthritis, Cancer ([place]), Chronic low back pain (12/15/2017), CKD (chronic kidney disease) stage 3, GFR 30-59 ml/min (HCC), Coronary artery disease, Dysrhythmia, Essential tremor (02/28/2016), GERD (gastroesophageal reflux disease), Hernia, History of hiatal hernia, Hyperlipemia, Hypertension, Macular degeneration, Neuromuscular disorder ([place]), Paroxysmal atrial fibrillation ([place]) (10/28/2013), Presence of permanent cardiac pacemaker (12/04/2018), Stroke ([place]) (12/2020), Tachycardia-bradycardia syndrome ([place]) (09/13/2014), and TIA (transient ischemic attack). here with:  1.  History of stroke  -Continue to manage risk factors with PCP -Keep good control of blood pressure with goal less than 130/90, LDL less than 70 and hemoglobin A1c less than 6.5%  2.  Peripheral neuropathy  -Increase Lyrica to 50 mg at bedtime.  We will not go much higher than this due to kidney function  3.  Essential tremor  -Continue primidone 250 mg twice a day -Continue Topamax 50 mg twice a day  Follow-up in  6 months or sooner if needed     Ward Givens, MSN, NP-C 10/29/2021, 11:43 AM [facility] [address], [place], [place] [address] [phone]

## 2021-10-29 NOTE — Telephone Encounter (Signed)
[place] drug registry checked last fill 08-28-21 #[id number] (Dr Barrie Folk Gates)(pregabalin 25mg  po daily)    Pregabalin 25mg  (what dose do you want this pt on (you last fill states 50mg  then 25mg ).  Son, stated you increased to 50mg .  Please clarify the dose and I will call pharmacy back, thanks

## 2021-10-29 NOTE — Progress Notes (Addendum)
Cardiology Office Note    Date:  10/31/2021   ID:  Douglas Walker, DOB January 07, 1925, MRN [id number]  PCP:  Josetta Huddle, MD  Cardiologist:  Sinclair Grooms, MD  Electrophysiologist:  [name], MD   Chief Complaint: f/u CHF, afib  History of Present Illness:   Douglas Walker is a 87 y.o. male with history of atrial fibrillation, chronic diastolic CHF, pulmonary HTN, sinus node dysfunction s/p PPM, dilated ascending aorta, mild AI, arthritis, chronic back pain, essential tremor, ?CAD, GERD, hiatal hernia, HLD, HTN, macular degeneration, stroke, peripheral neuropathy who presents for follow-up.  He has known history of atrial fibrillation and recurrent syncope. He was ultimately found to have tachy-brady syndrome. In 2019 he underwent [employer] PPM implantation which has been followed by Dr. Lovena Le. CAD is listed under PMH but no further details available. No cath or nuc on file. Last echo 12/2020 EF 98-92%, diastolic function could not be evaluated, mildly reduced RV function, moderately elevated PASP, mod BAE, mild AI, moderate dilation of ascending aorta. He is on warfarin because of prior drug interaction between Eliquis and primidone. He last saw Dr. Lovena Le 05/2021 and was felt reasonably stable. He was admitted 12/1939 with complicated UTI and 74/0814 with severe sepsis/bacteremia in setting of CAUTI/pyelonephritis with chronic foley catheter (blood cx + Proteus).  He is seen back for follow-up accompanied by his son. The patient does not contribute much to the conversation. His son is very helpful at filling in recent events and follow-up. The patient has also been following up with urology and neurology. He states [facility] ran repeat testing and determined there may still be infection present that oral antibiotics may not address so they are seeing ID tomorrow (Dr. Gale Journey). The patient complains of fatigue and knee pain - Lyrica recently increased by neurology for neuropathy.  Otherwise no CP, SOB or acute cardiac complaints. Uses walker.  Labwork independently reviewed: [facility] 10/25/21 Hgb 15.7, Cr 1.14, K 4.1 09/2021 K 4.0, Cr 1.21, Hgb 9.9, albumin 3.2, AST/ALT up 65/58, Mg 1.9   Past Medical History:  Diagnosis Date   Arthritis    shoulders and back   Ascending aorta dilatation (HCC)    Cancer (HCC)    skin cancers   Chronic diastolic (congestive) heart failure (HCC)    Chronic low back pain 12/15/2017   CKD (chronic kidney disease) stage 3, GFR 30-59 ml/min (HCC)    Dysrhythmia     Paroxysmal atrial fibrillation; Tachycardia-bradycardia syndrome   Essential tremor 02/28/2016   GERD (gastroesophageal reflux disease)    Hernia    History of hiatal hernia    Hyperlipemia    Hypertension    Macular degeneration    Neuromuscular disorder ([place])    neuropathy   Paroxysmal atrial fibrillation ([place]) 10/28/2013   Presence of permanent cardiac pacemaker 12/04/2018   Pulmonary hypertension ([place])    Stroke ([place]) 12/2020   Tachycardia-bradycardia syndrome ([place]) 09/13/2014   TIA (transient ischemic attack)     Past Surgical History:  Procedure Laterality Date   [place] / REPLACE / REMOVE PACEMAKER  12/04/2018   IRRIGATION AND DEBRIDEMENT ABSCESS Right 07/24/2020   Procedure: IRRIGATION AND DEBRIDEMENT HEMATOMA;  Surgeon: Coralie Keens, MD;  Location: [place];  Service: General;  Laterality: Right;   MASS EXCISION Right 07/20/2020   Procedure: EXCISION OF RIGHT CHEST WALL MASS;  Surgeon: Stark Klein, MD;  Location: [place];  Service: General;  Laterality: Right;   PACEMAKER IMPLANT N/A 12/04/2018   Procedure: PACEMAKER IMPLANT;  Surgeon: Evans Lance, MD;  Location: [place] [facility];  Service: Cardiovascular;  Laterality: N/A;    Current Medications: Current Meds  Medication Sig   acetaminophen (TYLENOL) 325 MG tablet Take 2 tablets (650 mg total) by mouth every 4 (four) hours  as needed for mild pain (or temp > 37.5 C (99.5 F)).   Ascorbic Acid (VITAMIN C) 1000 MG tablet Take 1,000 mg by mouth daily.   cephALEXin (KEFLEX) 250 MG capsule Take 250 mg by mouth daily.   DULoxetine (CYMBALTA) 60 MG capsule Take 1 capsule (60 mg total) by mouth daily.   furosemide (LASIX) 20 MG tablet Take 2 tablets (40 mg total) by mouth daily.   levothyroxine (SYNTHROID) 25 MCG tablet Take 1 tablet (25 mcg total) by mouth daily before breakfast.   loratadine (CLARITIN) 10 MG tablet Take 10 mg by mouth daily.   Multiple Vitamin (MULTIVITAMIN WITH MINERALS) TABS tablet Take 1 tablet by mouth daily.   Multiple Vitamins-Minerals (OCUVITE ADULT 50+) CAPS Take 1 capsule by mouth daily.   OVER THE COUNTER MEDICATION Take 15,000 mg by mouth 2 (two) times daily. PT takes a Cranberry Supplement that treats UTI 1 cap twice a day   phenazopyridine (PYRIDIUM) 200 MG tablet Take 200 mg by mouth See admin instructions. 200mg  oral at night And 200mg  extra twice daily as needed for bladder spasms   polyethylene glycol (MIRALAX / GLYCOLAX) 17 g packet Take 17 g by mouth daily. (Patient taking differently: Take 17 g by mouth daily as needed for mild constipation.)   pregabalin (LYRICA) 25 MG capsule Take 2 capsules (50 mg total) by mouth at bedtime. TAKE 1 CAPSULE(25 MG) BY MOUTH AT BEDTIME   primidone (MYSOLINE) 250 MG tablet Take 1 tablet (250 mg total) by mouth 2 (two) times daily.   topiramate (TOPAMAX) 50 MG tablet Take 1 tablet (50 mg total) by mouth 2 (two) times daily.   vitamin B-12 (CYANOCOBALAMIN) 1000 MCG tablet Take 1 tablet (1,000 mcg total) by mouth every other day. (Patient taking differently: Take 1,000 mcg by mouth See admin instructions. 1056mcg oral daily on Sunday, Tuesday, Wednesday, Thursday, repeat.)   warfarin (COUMADIN) 5 MG tablet Take 1 to 1.5 tablets daily as directed by Coumadin clinic (Patient taking differently: Take 7.5-10 mg by mouth See admin instructions. 7.5mg  on everyday  except Friday. Friday take 10mg )      Allergies:   Sulfa antibiotics, Primidone, and Simvastatin   Social History   Socioeconomic History   Marital status: Widowed    Spouse name: Not on file   Number of children: 1   Years of education: 10   Highest education level: Not on file  Occupational History    Comment: retired  Tobacco Use   Smoking status: Former    Types: Cigarettes    Quit date: 12/30/1962    Years since quitting: 58.8   Smokeless tobacco: Never  Vaping Use   Vaping Use: Never used  Substance and Sexual Activity   Alcohol use: No   Drug use: No   Sexual activity: Not Currently  Other Topics Concern   Not on file  Social History Narrative   Lives alone   Right-handed   Caffeine use- caffeine free coffee, occas soda   Social Determinants of Health   Financial Resource Strain: Not on file  Food  Insecurity: Not on file  Transportation Needs: Not on file  Physical Activity: Not on file  Stress: Not on file  Social Connections: Not on file     Family History:  The patient's family history includes Blindness in his sister; GI problems in his mother; Hearing loss in his sister; Other in his sister. There is no history of Heart attack or Stroke.  ROS:   Please see the history of present illness. All other systems are reviewed and otherwise negative.    EKGs/Labs/Other Studies Reviewed:    Studies reviewed are outlined and summarized above. Reports included below if pertinent.  2D Echo 12/2020  1. Left ventricular ejection fraction, by estimation, is 50 to 55%. The  left ventricle has low normal function. The left ventricle has no regional  wall motion abnormalities. Left ventricular diastolic function could not  be evaluated.   2. Right ventricular systolic function is mildly reduced. The right  ventricular size is normal. There is moderately elevated pulmonary artery  systolic pressure.   3. Left atrial size was moderately dilated.   4. Right  atrial size was moderately dilated.   5. The mitral valve is normal in structure. No evidence of mitral valve  regurgitation.   6. The aortic valve is tricuspid. Aortic valve regurgitation is mild.  Mild aortic valve sclerosis is present, with no evidence of aortic valve  stenosis.   7. Aortic dilatation noted. There is moderate dilatation of the ascending  aorta, measuring 47 mm.   8. The inferior vena cava is dilated in size with <50% respiratory  variability, suggesting right atrial pressure of 15 mmHg.   Comparison(s): Prior images unable to be directly viewed, comparison made  by report only. Ascending aorta dilation has worsened since 2019 (but  measurement is similar to CT angio report from may 2021).     EKG:  EKG is not ordered today but reviewed from 10/08/21 - Difficult to discern without obvious P wave activity therefore suspect atrial fib with V pacing, one PVC  Recent Labs: 01/29/2021: B Natriuretic Peptide 391.1 10/09/2021: ALT 58; Magnesium 1.9 10/12/2021: BUN 28; Creatinine, Ser 1.21; Hemoglobin 9.9; Platelets 63; Potassium 4.0; Sodium 135  Recent Lipid Panel    Component Value Date/Time   CHOL 188 01/29/2021 0237   TRIG 96 01/29/2021 0237   HDL 43 01/29/2021 0237   CHOLHDL 4.4 01/29/2021 0237   VLDL 19 01/29/2021 0237   LDLCALC 126 (H) 01/29/2021 0237    PHYSICAL EXAM:    VS:  BP (!) 108/58   Pulse 70   Ht 5' 9.5" (1.765 m)   Wt 203 lb 3.2 oz (92.2 kg)   SpO2 96%   BMI 29.58 kg/m   BMI: Body mass index is 29.58 kg/m.  GEN: Frail appearing elderly male in no acute distress HEENT: normocephalic, atraumatic Neck: no JVD, carotid bruits, or masses Cardiac: RRR; no murmurs, rubs, or gallops, trivial sockline edema bilaterally Respiratory:  clear to auscultation bilaterally, normal work of breathing GI: soft, nontender, nondistended, + BS MS: kyphotic posture Skin: warm and dry, no rash Neuro:  Alert and Oriented x 3 but does not contribute much to  conversation, Strength and sensation are intact, follows commands, essential tremor noted most prominent in mouth Psych: reserved affect  Wt Readings from Last 3 Encounters:  10/31/21 203 lb 3.2 oz (92.2 kg)  10/29/21 202 lb 3.2 oz (91.7 kg)  10/15/21 222 lb 7.1 oz (100.9 kg)     ASSESSMENT & PLAN:  1. Persistent atrial fibrillation - rates are controlled without any AVN blocking agents. Recent EKG 10/08/21 did not show any convincing atrial activity, similar to prior EKGs dating back to 02/2020 when he was felt to be in atrial fib -- therefore this likely represents persistent atrial fib with V pacing. Continue strategy of rhythm observation and anticoagulation. Warfarin is managed by our Coumadin clinic. He is not on DOAC due to prior acknowledged interaction with his other medication as above.  2. Tachy-brady syndrome s/p PPM - last device interrogation 10/17/21 showed normal device function. Continue usual EP follow-up as previously recommended - will see Dr. Lovena Le 05/2022. Of note he has had recent issues with bacteremia - he is seeing ID tomorrow so will await any recommendations regarding any further testing given that he has a device.  3. Chronic diastolic CHF w/ Pulmonary HTN by echo 12/2020 - volume status looks OK. Son felt there is swelling present. Fortunately there is only trace sockline edema. I would continue current Lasix dose without escalation given BP (rechecked by me @ 110/70). Reviewed 2g sodium restriction, 64oz fluid restriction.  4. Moderate dilation of ascending aorta - seen on prior echo 12/2020. Dilation had worsened since 2019 but was similar to CT angio report in 04/2020. Consideration could be given to a follow-up echo in 2023 but given his comorbidities and advanced age this would likely be purely informational only as he would be a poor candidate for surgical intervention if this ever progressed to that point.  5. Mild AI - by echo 12/2020 - follow clinically.     Disposition: F/u with Dr. Tamala Julian in 6 months. Also was previously recommended to f/u 05/2022 with Dr. Lovena Le.   Medication Adjustments/Labs and Tests Ordered: Current medicines are reviewed at length with the patient today.  Concerns regarding medicines are outlined above. Medication changes, Labs and Tests ordered today are summarized above and listed in the Patient Instructions accessible in Encounters.   Signed, Charlie Pitter, PA-C  10/31/2021 11:38 AM    [address], [place], [place]  [address] Phone: [phone]; Fax: [phone]

## 2021-10-30 DIAGNOSIS — L570 Actinic keratosis: Secondary | ICD-10-CM | POA: Diagnosis not present

## 2021-10-30 DIAGNOSIS — N390 Urinary tract infection, site not specified: Secondary | ICD-10-CM | POA: Diagnosis not present

## 2021-10-30 DIAGNOSIS — Z9289 Personal history of other medical treatment: Secondary | ICD-10-CM | POA: Diagnosis not present

## 2021-10-30 DIAGNOSIS — M170 Bilateral primary osteoarthritis of knee: Secondary | ICD-10-CM | POA: Diagnosis not present

## 2021-10-30 DIAGNOSIS — A419 Sepsis, unspecified organism: Secondary | ICD-10-CM | POA: Diagnosis not present

## 2021-10-31 ENCOUNTER — Other Ambulatory Visit: Payer: Self-pay

## 2021-10-31 ENCOUNTER — Encounter: Payer: Self-pay | Admitting: Physician Assistant

## 2021-10-31 ENCOUNTER — Ambulatory Visit (INDEPENDENT_AMBULATORY_CARE_PROVIDER_SITE_OTHER): Payer: Medicare Other

## 2021-10-31 ENCOUNTER — Ambulatory Visit (INDEPENDENT_AMBULATORY_CARE_PROVIDER_SITE_OTHER): Payer: Medicare Other | Admitting: Physician Assistant

## 2021-10-31 VITALS — BP 108/58 | HR 70 | Ht 69.5 in | Wt 203.2 lb

## 2021-10-31 DIAGNOSIS — Z5181 Encounter for therapeutic drug level monitoring: Secondary | ICD-10-CM

## 2021-10-31 DIAGNOSIS — I639 Cerebral infarction, unspecified: Secondary | ICD-10-CM | POA: Diagnosis not present

## 2021-10-31 DIAGNOSIS — I351 Nonrheumatic aortic (valve) insufficiency: Secondary | ICD-10-CM | POA: Diagnosis not present

## 2021-10-31 DIAGNOSIS — I77810 Thoracic aortic ectasia: Secondary | ICD-10-CM | POA: Diagnosis not present

## 2021-10-31 DIAGNOSIS — I495 Sick sinus syndrome: Secondary | ICD-10-CM

## 2021-10-31 DIAGNOSIS — Z950 Presence of cardiac pacemaker: Secondary | ICD-10-CM

## 2021-10-31 DIAGNOSIS — I5032 Chronic diastolic (congestive) heart failure: Secondary | ICD-10-CM

## 2021-10-31 DIAGNOSIS — K5900 Constipation, unspecified: Secondary | ICD-10-CM | POA: Diagnosis not present

## 2021-10-31 DIAGNOSIS — I272 Pulmonary hypertension, unspecified: Secondary | ICD-10-CM

## 2021-10-31 DIAGNOSIS — Z466 Encounter for fitting and adjustment of urinary device: Secondary | ICD-10-CM | POA: Diagnosis not present

## 2021-10-31 DIAGNOSIS — I480 Paroxysmal atrial fibrillation: Secondary | ICD-10-CM

## 2021-10-31 DIAGNOSIS — I5042 Chronic combined systolic (congestive) and diastolic (congestive) heart failure: Secondary | ICD-10-CM | POA: Diagnosis not present

## 2021-10-31 DIAGNOSIS — N401 Enlarged prostate with lower urinary tract symptoms: Secondary | ICD-10-CM | POA: Diagnosis not present

## 2021-10-31 DIAGNOSIS — E039 Hypothyroidism, unspecified: Secondary | ICD-10-CM | POA: Diagnosis not present

## 2021-10-31 DIAGNOSIS — G250 Essential tremor: Secondary | ICD-10-CM | POA: Diagnosis not present

## 2021-10-31 DIAGNOSIS — I4819 Other persistent atrial fibrillation: Secondary | ICD-10-CM | POA: Diagnosis not present

## 2021-10-31 DIAGNOSIS — Z1612 Extended spectrum beta lactamase (ESBL) resistance: Secondary | ICD-10-CM | POA: Diagnosis not present

## 2021-10-31 DIAGNOSIS — Z8673 Personal history of transient ischemic attack (TIA), and cerebral infarction without residual deficits: Secondary | ICD-10-CM | POA: Diagnosis not present

## 2021-10-31 DIAGNOSIS — D696 Thrombocytopenia, unspecified: Secondary | ICD-10-CM | POA: Diagnosis not present

## 2021-10-31 DIAGNOSIS — Z7901 Long term (current) use of anticoagulants: Secondary | ICD-10-CM | POA: Diagnosis not present

## 2021-10-31 DIAGNOSIS — N1832 Chronic kidney disease, stage 3b: Secondary | ICD-10-CM | POA: Diagnosis not present

## 2021-10-31 DIAGNOSIS — Z87440 Personal history of urinary (tract) infections: Secondary | ICD-10-CM | POA: Diagnosis not present

## 2021-10-31 DIAGNOSIS — I129 Hypertensive chronic kidney disease with stage 1 through stage 4 chronic kidney disease, or unspecified chronic kidney disease: Secondary | ICD-10-CM | POA: Diagnosis not present

## 2021-10-31 LAB — POCT INR: INR: 1.2 — AB (ref 2.0–3.0)

## 2021-10-31 NOTE — Patient Instructions (Signed)
Medication Instructions:  Your physician recommends that you continue on your current medications as directed. Please refer to the Current Medication list given to you today.  *If you need a refill on your cardiac medications before your next appointment, please call your pharmacy*   Lab Work: None ordered  If you have labs (blood work) drawn today and your tests are completely normal, you will receive your results only by: [place] (if you have MyChart) OR A paper copy in the mail If you have any lab test that is abnormal or we need to change your treatment, we will call you to review the results.   Testing/Procedures: None ordere    Follow-Up: At [facility], you and your health needs are our priority.  As part of our continuing mission to provide you with exceptional heart care, we have created designated Provider Care Teams.  These Care Teams include your primary Cardiologist (physician) and Advanced Practice Providers (APPs -  Physician Assistants and Nurse Practitioners) who all work together to provide you with the care you need, when you need it.  We recommend signing up for the patient portal called "MyChart".  Sign up information is provided on this After Visit Summary.  MyChart is used to connect with patients for Virtual Visits (Telemedicine).  Patients are able to view lab/test results, encounter notes, upcoming appointments, etc.  Non-urgent messages can be sent to your provider as well.   To learn more about what you can do with MyChart, go to [email or web address].    Your next appointment:   6 month(s)  The format for your next appointment:   In Person  Provider:   Daneen Schick, MD or Melina Copa, PA-C   Other Instructions

## 2021-10-31 NOTE — Patient Instructions (Signed)
Description   Take 2 tablets warfarin tonight and tomorrow, then resume taking warfarin 1.5 tablets daily. Recheck INR on Monday. Coumadin Clinic [phone].

## 2021-11-01 ENCOUNTER — Ambulatory Visit (INDEPENDENT_AMBULATORY_CARE_PROVIDER_SITE_OTHER): Payer: Medicare Other | Admitting: Internal Medicine

## 2021-11-01 ENCOUNTER — Other Ambulatory Visit: Payer: Self-pay

## 2021-11-01 ENCOUNTER — Encounter: Payer: Self-pay | Admitting: Internal Medicine

## 2021-11-01 VITALS — BP 112/61 | HR 69 | Temp 98.1°F

## 2021-11-01 DIAGNOSIS — N401 Enlarged prostate with lower urinary tract symptoms: Secondary | ICD-10-CM

## 2021-11-01 DIAGNOSIS — N390 Urinary tract infection, site not specified: Secondary | ICD-10-CM | POA: Diagnosis not present

## 2021-11-01 DIAGNOSIS — Z978 Presence of other specified devices: Secondary | ICD-10-CM

## 2021-11-01 MED ORDER — NITROFURANTOIN MONOHYD MACRO 100 MG PO CAPS
100.0000 mg | ORAL_CAPSULE | Freq: Every day | ORAL | 5 refills | Status: DC
Start: 2021-11-01 — End: 2022-01-03

## 2021-11-01 NOTE — Progress Notes (Signed)
[facility] for Infectious Disease  Reason for Consult:MDRO uti  Referring Provider: Inda Merlin    Patient Active Problem List   Diagnosis Date Noted   Bacteremia due to Proteus species 10/11/2021   Sepsis secondary to UTI ([place]) 10/09/2021   AKI (acute kidney injury) ([place]) 07/06/2021   UTI (urinary tract infection) 07/02/2021   Pain in both testicles    Right hemiparesis ([place]) 04/23/2021   Cerebrovascular accident (CVA) due to embolism of left middle cerebral artery ([place]) 04/23/2021   Neuropathy 04/23/2021   Abnormality of gait 04/23/2021   Abnormal results of thyroid function studies 04/04/2021   Acquired iron deficiency anemia due to decreased absorption 04/04/2021   Acquired thrombophilia ([place]) 04/04/2021   Cerebral atherosclerosis 04/04/2021   Basal cell carcinoma of nose 04/04/2021   Benign hypertensive heart and renal disease, with heart and renal failure ([place]) 04/04/2021   Benign prostatic hyperplasia 04/04/2021   Obesity 04/04/2021   Bradycardia 04/04/2021   Closed fracture of one rib 04/04/2021   Constipation by delayed colonic transit 04/04/2021   Fatigue 04/04/2021   Gastro-esophageal reflux disease without esophagitis 04/04/2021   Hypothyroidism 04/04/2021   Left lower quadrant pain 04/04/2021   Lumbar radiculopathy 04/04/2021   Reactive depression 04/04/2021   Malignant neoplasm of anterior wall of urinary bladder ([place]) 04/04/2021   Mass of chest wall 04/04/2021   Mixed hyperlipidemia 04/04/2021   Nocturia 04/04/2021   Other shoulder lesions, right shoulder 04/04/2021   Pain in limb 04/04/2021   Peripheral edema 04/04/2021   Personal history of malignant neoplasm of bladder 04/04/2021   Pruritus of genitalia 04/04/2021   Tremor 04/04/2021   Unilateral primary osteoarthritis, left knee 04/04/2021   Unspecified mononeuropathy of unspecified lower limb 04/04/2021   Urinary incontinence 04/04/2021   Dysphagia, post-stroke    Chronic diastolic  congestive heart failure (HCC)    Chronic pain syndrome    Left middle cerebral artery stroke ([place]) 02/01/2021   Acute CVA (cerebrovascular accident) ([place]) 01/28/2021   Pacemaker 08/30/2020   Hematoma, chest wall 07/24/2020   Exudative age-related macular degeneration of right eye with inactive choroidal neovascularization ([place]) 05/10/2020   Exudative age-related macular degeneration of left eye with inactive choroidal neovascularization ([place]) 05/10/2020   Retinal hemorrhage of right eye 05/10/2020   Exposure keratopathy, bilateral 05/10/2020   Bilateral dry eyes 05/10/2020   Advanced nonexudative age-related macular degeneration of both eyes with subfoveal involvement 05/10/2020   Sick sinus syndrome ([place]) 12/06/2018   Complete heart block (HCC) 12/04/2018   Chronic low back pain 12/15/2017   Hereditary and idiopathic peripheral neuropathy 07/01/2016   Essential tremor 02/28/2016   BPPV (benign paroxysmal positional vertigo) 06/19/2015   Dizziness 06/18/2015   Bacteria in urine 06/18/2015   CKD (chronic kidney disease) stage 3, GFR 30-59 ml/min: Probable 06/18/2015   Chronic diastolic CHF (congestive heart failure) ([place]) 05/30/2015   Fall    Syncope 05/19/2015   Lower urinary tract infectious disease 05/19/2015   Hypotension 05/19/2015   Dehydration 05/19/2015   Fracture of rib of left side 05/19/2015   Syncope and collapse 05/19/2015   Mobitz type II atrioventricular block 09/13/2014   Essential hypertension 09/13/2014   Chronic anticoagulation 09/13/2014   Tachycardia-bradycardia syndrome ([place]) 09/13/2014   Paroxysmal atrial fibrillation ([place]) 10/28/2013   Thrombocytopenia ([place]) 04/06/2013      HPI: Douglas Walker is a 87 y.o. male hx pacemaker, neuropathy, LUTS in setting bph, hx chronic prostatitis, left MCA cva, chf with preserved ef,  ckd3, pAfib, hx low-grade transitional cell carcinoma of bladder 2005 (no recurrence on subsequent cystoscopy), referred here to id  clinic for hx of MDRO uti  Reviewed outside record: He has been seen by [facility] urology who has recently recommended continuous bladder drainage with urinary catheter for bph/luts (at least 3 months prior to 11/01/2021 visit, to be changed every 3 weeks) Patient noted to have had 2 uti requiring hospital admission for sepsis. One in 06/2024 with esbl, and one 10/25/21 with proteus. Both given meropenem. He has imaging which showed no GU stone  He appears to have been placed on cephalexin chronic suppression  Previous cx: 10/27 [facility] urine cx >100k proteus mirabilis (R amp/sulb, ceftriaxone, cefazolin, cipro, cefotetan, nitrofurantoin, bactrim; S ertapenem, tobra, amikacin) 09/01 [facility] urine cx > 100k pseudomonas (pan sensitive piptazo, meropenem, cipro, ceftaz, aztreonam, gent) 07/25 [facility] ucx >100k e coli esbl (R cipro; s amikacin, ertapenem, nitrofurantoin, gent, bactrim) 06/09 [facility] citrobacter murliniae (R ceftriaxone, cefotaxim, cipro; S nitrofurantoin, gent, amikacin, nitrofurantoin) 11/2020 [facility] esbl ecoli (R cipro; S gent, amikacin, nitrofurantoin, bactrim)     Of note, patient was on macrobid when his first sepsis (06/2021) occurred. Patient was on macrobid for about 3 months   He wasn't on any other prophylaxis at that time   He wasn't on methanamene before. But he is getting cranberry/vit c     Meds:  Reviewed Coumadin Duloxetine Furosemide Lyrica Primidone Topiramate Valsartan flomax cephalixin  Review of Systems: ROS All other ROS negative       Past Medical History:  Diagnosis Date   Arthritis    shoulders and back   Ascending aorta dilatation (HCC)    Cancer (HCC)    skin cancers   Chronic diastolic (congestive) heart failure (HCC)    Chronic low back pain 12/15/2017   CKD (chronic kidney disease) stage 3, GFR 30-59 ml/min (HCC)    Dysrhythmia     Paroxysmal atrial fibrillation; Tachycardia-bradycardia syndrome    Essential tremor 02/28/2016   GERD (gastroesophageal reflux disease)    Hernia    History of hiatal hernia    Hyperlipemia    Hypertension    Macular degeneration    Neuromuscular disorder (HCC)    neuropathy   Paroxysmal atrial fibrillation ([place]) 10/28/2013   Presence of permanent cardiac pacemaker 12/04/2018   Pulmonary hypertension ([place])    Stroke ([place]) 12/2020   Tachycardia-bradycardia syndrome ([place]) 09/13/2014   TIA (transient ischemic attack)     Social History   Tobacco Use   Smoking status: Former    Types: Cigarettes    Quit date: 12/30/1962    Years since quitting: 58.8   Smokeless tobacco: Never  Vaping Use   Vaping Use: Never used  Substance Use Topics   Alcohol use: No   Drug use: No    Family History  Problem Relation Age of Onset   GI problems Mother    Other Sister        PAIN ISSUES   Hearing loss Sister    Blindness Sister    Heart attack Neg Hx    Stroke Neg Hx     Allergies  Allergen Reactions   Sulfa Antibiotics Other (See Comments)    Weakness, dizziness   Primidone     Other reaction(s): increased sedation   Simvastatin     Other reaction(s): arthralgias and fatigue, severe    OBJECTIVE: There were no vitals filed for this visit. There is no height or weight on file to  calculate BMI.   Physical Exam General/constitutional: no distress, pleasant; resting tremor; walks with walker HEENT: Normocephalic, PER, Conj Clear, EOMI, Oropharynx clear Neck supple CV: rrr no mrg Lungs: clear to auscultation, normal respiratory effort Abd: Soft, Nontender Ext: no edema Skin: No Rash Neuro: nonfocal MSK: no peripheral joint swelling/tenderness/warmth; back spines nontender GU: foley catheter with clear yellow urine Psych: alert, not much verbal   Lab: Lab Results  Component Value Date   WBC 9.0 10/12/2021   HGB 9.9 (L) 10/12/2021   HCT 29.4 (L) 10/12/2021   MCV 99.0 10/12/2021   PLT 63 (L) 46/80/3212   Last metabolic panel Lab  Results  Component Value Date   GLUCOSE 111 (H) 10/12/2021   NA 135 10/12/2021   K 4.0 10/12/2021   CL 105 10/12/2021   CO2 22 10/12/2021   BUN 28 (H) 10/12/2021   CREATININE 1.21 10/12/2021   GFRNONAA 55 (L) 10/12/2021   CALCIUM 8.2 (L) 10/12/2021   PHOS 3.8 02/01/2021   PROT 5.7 (L) 10/09/2021   ALBUMIN 3.2 (L) 10/09/2021   BILITOT 0.8 10/09/2021   ALKPHOS 94 10/09/2021   AST 65 (H) 10/09/2021   ALT 58 (H) 10/09/2021   ANIONGAP 8 10/12/2021    Microbiology:  Serology:  Imaging:   Assessment/plan: Problem List Items Addressed This Visit       Genitourinary   Benign prostatic hyperplasia - Primary   Other Visit Diagnoses     Chronic indwelling Foley catheter       Recurrent UTI           As of 11/01/2021 per patient's son, he has had several at least 83 a year  Discussed assymptomatic bacteriuria and risk of this with his bph, foley He does seem to have admissions related to uti/sepsis 06/2021 and 09/2021.  Discuss things to avoid looking at in terms of dx for uti such as odor  Advise patient and his son to call our clinic if concern for uti  Reviewed micros form the past year; majority is sensitive to macrobid. Reviewed current evidence of various gfr which doesn't affect macrobid if >30 mL/min  Discuss with patient and his son also the failure is determined long term (over 6 months to a year) in terms of seeing reduction in uti frequency. I do not think he has failed macrobid  All his previous bacteria are resistant to cephalexin    -restart macrobid 100 mg twice a day for 2 weeks then once daily -stop cephalexin -follow up in 3 months or sooner if concern for uti sx -continue to follow urology for catheter care -there is also option of methenamine/vit c use in the future if needed -can continue cranberry juice/vit c   I spent 60 minute reviewing data/chart, and coordinating care and >50% direct face to face time providing counseling/discussing  diagnostics/treatment plan with patient     Follow-up: No follow-ups on file.  Douglas Walker, [place] for [place] [phone] pager   [phone] cell 11/01/2021, 10:08 AM

## 2021-11-01 NOTE — Patient Instructions (Addendum)
You'll always have bacteria in your urine  Do not check urine randomly   Diagnosis of UTI is by symptoms (fever, chill, persistent progressive decreased mentation) and supported by lab   You could call our clinic from now on for concern of infection. We'll manage your antibiotics   For now, we'll go back on macrobid (twice daily for 2 weeks, then once a day before bedtime afterward). We need at least 6 months to a year of data and see how you do before calling it not working. If you have a decreased in frequency of uti, then it works. No medication is perfect in terms of totally eliminated all uti bouts.

## 2021-11-02 ENCOUNTER — Telehealth: Payer: Self-pay

## 2021-11-02 NOTE — Telephone Encounter (Signed)
Pt's son called to let us know that pt will have [facility] nurse at his home on Monday, 11/7 and she will be able to do fingerstick and call us w/ results for dosing. Cancelled pt's appt w/ [place] on that day and gave him direct # for nurse to call.

## 2021-11-05 ENCOUNTER — Ambulatory Visit (INDEPENDENT_AMBULATORY_CARE_PROVIDER_SITE_OTHER): Payer: Medicare Other

## 2021-11-05 DIAGNOSIS — Z5181 Encounter for therapeutic drug level monitoring: Secondary | ICD-10-CM

## 2021-11-05 DIAGNOSIS — I129 Hypertensive chronic kidney disease with stage 1 through stage 4 chronic kidney disease, or unspecified chronic kidney disease: Secondary | ICD-10-CM | POA: Diagnosis not present

## 2021-11-05 DIAGNOSIS — I480 Paroxysmal atrial fibrillation: Secondary | ICD-10-CM

## 2021-11-05 DIAGNOSIS — N401 Enlarged prostate with lower urinary tract symptoms: Secondary | ICD-10-CM | POA: Diagnosis not present

## 2021-11-05 DIAGNOSIS — N1832 Chronic kidney disease, stage 3b: Secondary | ICD-10-CM | POA: Diagnosis not present

## 2021-11-05 DIAGNOSIS — Z466 Encounter for fitting and adjustment of urinary device: Secondary | ICD-10-CM | POA: Diagnosis not present

## 2021-11-05 DIAGNOSIS — I5042 Chronic combined systolic (congestive) and diastolic (congestive) heart failure: Secondary | ICD-10-CM | POA: Diagnosis not present

## 2021-11-05 LAB — POCT INR: INR: 2 (ref 2.0–3.0)

## 2021-11-05 NOTE — Patient Instructions (Signed)
Left message for Glenard Haring, RN w/ [facility].  Advised her to have pt continue taking warfarin 1.5 tablets daily. Recheck INR in 1 week. Coumadin Clinic [phone].

## 2021-11-06 DIAGNOSIS — I129 Hypertensive chronic kidney disease with stage 1 through stage 4 chronic kidney disease, or unspecified chronic kidney disease: Secondary | ICD-10-CM | POA: Diagnosis not present

## 2021-11-06 DIAGNOSIS — N401 Enlarged prostate with lower urinary tract symptoms: Secondary | ICD-10-CM | POA: Diagnosis not present

## 2021-11-06 DIAGNOSIS — N1832 Chronic kidney disease, stage 3b: Secondary | ICD-10-CM | POA: Diagnosis not present

## 2021-11-06 DIAGNOSIS — I5042 Chronic combined systolic (congestive) and diastolic (congestive) heart failure: Secondary | ICD-10-CM | POA: Diagnosis not present

## 2021-11-06 DIAGNOSIS — I480 Paroxysmal atrial fibrillation: Secondary | ICD-10-CM | POA: Diagnosis not present

## 2021-11-06 DIAGNOSIS — Z466 Encounter for fitting and adjustment of urinary device: Secondary | ICD-10-CM | POA: Diagnosis not present

## 2021-11-07 ENCOUNTER — Encounter: Payer: Self-pay | Admitting: Podiatry

## 2021-11-07 ENCOUNTER — Ambulatory Visit (INDEPENDENT_AMBULATORY_CARE_PROVIDER_SITE_OTHER): Payer: Medicare Other | Admitting: Podiatry

## 2021-11-07 ENCOUNTER — Other Ambulatory Visit: Payer: Self-pay

## 2021-11-07 DIAGNOSIS — L608 Other nail disorders: Secondary | ICD-10-CM

## 2021-11-07 DIAGNOSIS — I480 Paroxysmal atrial fibrillation: Secondary | ICD-10-CM | POA: Diagnosis not present

## 2021-11-07 DIAGNOSIS — M79676 Pain in unspecified toe(s): Secondary | ICD-10-CM

## 2021-11-07 DIAGNOSIS — B351 Tinea unguium: Secondary | ICD-10-CM | POA: Diagnosis not present

## 2021-11-07 DIAGNOSIS — D696 Thrombocytopenia, unspecified: Secondary | ICD-10-CM | POA: Diagnosis not present

## 2021-11-07 DIAGNOSIS — I5042 Chronic combined systolic (congestive) and diastolic (congestive) heart failure: Secondary | ICD-10-CM | POA: Diagnosis not present

## 2021-11-07 DIAGNOSIS — N1832 Chronic kidney disease, stage 3b: Secondary | ICD-10-CM | POA: Diagnosis not present

## 2021-11-07 DIAGNOSIS — N401 Enlarged prostate with lower urinary tract symptoms: Secondary | ICD-10-CM | POA: Diagnosis not present

## 2021-11-07 DIAGNOSIS — D689 Coagulation defect, unspecified: Secondary | ICD-10-CM | POA: Diagnosis not present

## 2021-11-07 DIAGNOSIS — E1142 Type 2 diabetes mellitus with diabetic polyneuropathy: Secondary | ICD-10-CM | POA: Diagnosis not present

## 2021-11-07 DIAGNOSIS — I129 Hypertensive chronic kidney disease with stage 1 through stage 4 chronic kidney disease, or unspecified chronic kidney disease: Secondary | ICD-10-CM | POA: Diagnosis not present

## 2021-11-07 DIAGNOSIS — Z466 Encounter for fitting and adjustment of urinary device: Secondary | ICD-10-CM | POA: Diagnosis not present

## 2021-11-07 NOTE — Progress Notes (Signed)
This patient returns to my office for at risk foot care.  This patient requires this care by a professional since this patient will be at risk due to having peripheral neuropathy, CKD, Coagulation defect and thrombocytopenia.  This patient is unable to cut nails himself since the patient cannot reach his nails.These nails are painful walking and wearing shoes.  This patient presents for at risk foot care today.  He presents to the office with male son.  He is taking coumadin.    General Appearance  Alert, conversant and in no acute stress.  Vascular  Dorsalis pedis and posterior tibial  pulses are not  palpable  bilaterally.  Capillary return is within normal limits  bilaterally. Temperature is within normal limits  Bilaterally. Swelling feet/legs  Neurologic  Senn-Weinstein monofilament wire test within normal limits  bilaterally. Muscle power within normal limits bilaterally.  Nails Thick disfigured discolored nails with subungual debris  from hallux to fifth toes bilaterally. Pincer nails hallux  B/L. No evidence of bacterial infection or drainage bilaterally.  Orthopedic  No limitations of motion  feet .  No crepitus or effusions noted.  No bony pathology or digital deformities noted.  HAV  B/L.  Skin  normotropic skin with no porokeratosis noted bilaterally.  No signs of infections or ulcers noted.     Onychomycosis  Pain in right toes  Pain in left toes  Consent was obtained for treatment procedures.   Mechanical debridement of nails 1-5  bilaterally performed with a nail nipper.  Filed with dremel without incident. Told him to soak his right foot in soapy sudsy water.   Return office visit  10 weeks                   Told patient to return for periodic foot care and evaluation due to potential at risk complications.   Gardiner Barefoot DPM

## 2021-11-09 ENCOUNTER — Telehealth: Payer: Self-pay

## 2021-11-09 NOTE — Telephone Encounter (Signed)
Spoke with patient's son [name] and DIL regarding scheduling a Palliative Care consult. They seemed upset that they were not made aware of this referral. Carla DIL states "they are fine and do not need any help" [name] advised that [facility] referral would be canceled and referring provider made aware.

## 2021-11-12 ENCOUNTER — Ambulatory Visit (INDEPENDENT_AMBULATORY_CARE_PROVIDER_SITE_OTHER): Payer: Medicare Other | Admitting: *Deleted

## 2021-11-12 DIAGNOSIS — Z466 Encounter for fitting and adjustment of urinary device: Secondary | ICD-10-CM | POA: Diagnosis not present

## 2021-11-12 DIAGNOSIS — I129 Hypertensive chronic kidney disease with stage 1 through stage 4 chronic kidney disease, or unspecified chronic kidney disease: Secondary | ICD-10-CM | POA: Diagnosis not present

## 2021-11-12 DIAGNOSIS — Z5181 Encounter for therapeutic drug level monitoring: Secondary | ICD-10-CM | POA: Diagnosis not present

## 2021-11-12 DIAGNOSIS — I5042 Chronic combined systolic (congestive) and diastolic (congestive) heart failure: Secondary | ICD-10-CM | POA: Diagnosis not present

## 2021-11-12 DIAGNOSIS — I480 Paroxysmal atrial fibrillation: Secondary | ICD-10-CM | POA: Diagnosis not present

## 2021-11-12 DIAGNOSIS — N401 Enlarged prostate with lower urinary tract symptoms: Secondary | ICD-10-CM | POA: Diagnosis not present

## 2021-11-12 DIAGNOSIS — N1832 Chronic kidney disease, stage 3b: Secondary | ICD-10-CM | POA: Diagnosis not present

## 2021-11-12 LAB — POCT INR: INR: 2.4 (ref 2.0–3.0)

## 2021-11-13 DIAGNOSIS — I480 Paroxysmal atrial fibrillation: Secondary | ICD-10-CM | POA: Diagnosis not present

## 2021-11-13 DIAGNOSIS — N1832 Chronic kidney disease, stage 3b: Secondary | ICD-10-CM | POA: Diagnosis not present

## 2021-11-13 DIAGNOSIS — I129 Hypertensive chronic kidney disease with stage 1 through stage 4 chronic kidney disease, or unspecified chronic kidney disease: Secondary | ICD-10-CM | POA: Diagnosis not present

## 2021-11-13 DIAGNOSIS — Z466 Encounter for fitting and adjustment of urinary device: Secondary | ICD-10-CM | POA: Diagnosis not present

## 2021-11-13 DIAGNOSIS — I5042 Chronic combined systolic (congestive) and diastolic (congestive) heart failure: Secondary | ICD-10-CM | POA: Diagnosis not present

## 2021-11-13 DIAGNOSIS — N401 Enlarged prostate with lower urinary tract symptoms: Secondary | ICD-10-CM | POA: Diagnosis not present

## 2021-11-19 DIAGNOSIS — I129 Hypertensive chronic kidney disease with stage 1 through stage 4 chronic kidney disease, or unspecified chronic kidney disease: Secondary | ICD-10-CM | POA: Diagnosis not present

## 2021-11-19 DIAGNOSIS — I5042 Chronic combined systolic (congestive) and diastolic (congestive) heart failure: Secondary | ICD-10-CM | POA: Diagnosis not present

## 2021-11-19 DIAGNOSIS — Z466 Encounter for fitting and adjustment of urinary device: Secondary | ICD-10-CM | POA: Diagnosis not present

## 2021-11-19 DIAGNOSIS — N401 Enlarged prostate with lower urinary tract symptoms: Secondary | ICD-10-CM | POA: Diagnosis not present

## 2021-11-19 DIAGNOSIS — I480 Paroxysmal atrial fibrillation: Secondary | ICD-10-CM | POA: Diagnosis not present

## 2021-11-19 DIAGNOSIS — N1832 Chronic kidney disease, stage 3b: Secondary | ICD-10-CM | POA: Diagnosis not present

## 2021-11-20 DIAGNOSIS — I480 Paroxysmal atrial fibrillation: Secondary | ICD-10-CM | POA: Diagnosis not present

## 2021-11-20 DIAGNOSIS — Z466 Encounter for fitting and adjustment of urinary device: Secondary | ICD-10-CM | POA: Diagnosis not present

## 2021-11-20 DIAGNOSIS — R338 Other retention of urine: Secondary | ICD-10-CM | POA: Diagnosis not present

## 2021-11-20 DIAGNOSIS — I5042 Chronic combined systolic (congestive) and diastolic (congestive) heart failure: Secondary | ICD-10-CM | POA: Diagnosis not present

## 2021-11-20 DIAGNOSIS — N1832 Chronic kidney disease, stage 3b: Secondary | ICD-10-CM | POA: Diagnosis not present

## 2021-11-20 DIAGNOSIS — I129 Hypertensive chronic kidney disease with stage 1 through stage 4 chronic kidney disease, or unspecified chronic kidney disease: Secondary | ICD-10-CM | POA: Diagnosis not present

## 2021-11-20 DIAGNOSIS — N401 Enlarged prostate with lower urinary tract symptoms: Secondary | ICD-10-CM | POA: Diagnosis not present

## 2021-11-26 ENCOUNTER — Ambulatory Visit (INDEPENDENT_AMBULATORY_CARE_PROVIDER_SITE_OTHER): Payer: Medicare Other | Admitting: *Deleted

## 2021-11-26 DIAGNOSIS — Z5181 Encounter for therapeutic drug level monitoring: Secondary | ICD-10-CM | POA: Diagnosis not present

## 2021-11-26 DIAGNOSIS — N401 Enlarged prostate with lower urinary tract symptoms: Secondary | ICD-10-CM | POA: Diagnosis not present

## 2021-11-26 DIAGNOSIS — I480 Paroxysmal atrial fibrillation: Secondary | ICD-10-CM | POA: Diagnosis not present

## 2021-11-26 DIAGNOSIS — N1832 Chronic kidney disease, stage 3b: Secondary | ICD-10-CM | POA: Diagnosis not present

## 2021-11-26 DIAGNOSIS — Z466 Encounter for fitting and adjustment of urinary device: Secondary | ICD-10-CM | POA: Diagnosis not present

## 2021-11-26 DIAGNOSIS — I129 Hypertensive chronic kidney disease with stage 1 through stage 4 chronic kidney disease, or unspecified chronic kidney disease: Secondary | ICD-10-CM | POA: Diagnosis not present

## 2021-11-26 DIAGNOSIS — I5042 Chronic combined systolic (congestive) and diastolic (congestive) heart failure: Secondary | ICD-10-CM | POA: Diagnosis not present

## 2021-11-26 LAB — POCT INR: INR: 2.5 (ref 2.0–3.0)

## 2021-11-26 NOTE — Patient Instructions (Signed)
Description   Spoke with Glenard Haring with [facility] and advised her to have pt continue taking warfarin 1.5 tablets daily. Recheck INR in 3 weeks. Coumadin Clinic [phone].

## 2021-11-27 DIAGNOSIS — N1832 Chronic kidney disease, stage 3b: Secondary | ICD-10-CM | POA: Diagnosis not present

## 2021-11-27 DIAGNOSIS — I5042 Chronic combined systolic (congestive) and diastolic (congestive) heart failure: Secondary | ICD-10-CM | POA: Diagnosis not present

## 2021-11-27 DIAGNOSIS — Z466 Encounter for fitting and adjustment of urinary device: Secondary | ICD-10-CM | POA: Diagnosis not present

## 2021-11-27 DIAGNOSIS — N401 Enlarged prostate with lower urinary tract symptoms: Secondary | ICD-10-CM | POA: Diagnosis not present

## 2021-11-27 DIAGNOSIS — I480 Paroxysmal atrial fibrillation: Secondary | ICD-10-CM | POA: Diagnosis not present

## 2021-11-27 DIAGNOSIS — I129 Hypertensive chronic kidney disease with stage 1 through stage 4 chronic kidney disease, or unspecified chronic kidney disease: Secondary | ICD-10-CM | POA: Diagnosis not present

## 2021-11-28 DIAGNOSIS — I5032 Chronic diastolic (congestive) heart failure: Secondary | ICD-10-CM | POA: Diagnosis not present

## 2021-11-29 DIAGNOSIS — Z466 Encounter for fitting and adjustment of urinary device: Secondary | ICD-10-CM | POA: Diagnosis not present

## 2021-11-29 DIAGNOSIS — I480 Paroxysmal atrial fibrillation: Secondary | ICD-10-CM | POA: Diagnosis not present

## 2021-11-29 DIAGNOSIS — N1832 Chronic kidney disease, stage 3b: Secondary | ICD-10-CM | POA: Diagnosis not present

## 2021-11-29 DIAGNOSIS — I5042 Chronic combined systolic (congestive) and diastolic (congestive) heart failure: Secondary | ICD-10-CM | POA: Diagnosis not present

## 2021-11-29 DIAGNOSIS — N401 Enlarged prostate with lower urinary tract symptoms: Secondary | ICD-10-CM | POA: Diagnosis not present

## 2021-11-29 DIAGNOSIS — I129 Hypertensive chronic kidney disease with stage 1 through stage 4 chronic kidney disease, or unspecified chronic kidney disease: Secondary | ICD-10-CM | POA: Diagnosis not present

## 2021-11-30 DIAGNOSIS — Z466 Encounter for fitting and adjustment of urinary device: Secondary | ICD-10-CM | POA: Diagnosis not present

## 2021-11-30 DIAGNOSIS — Z8673 Personal history of transient ischemic attack (TIA), and cerebral infarction without residual deficits: Secondary | ICD-10-CM | POA: Diagnosis not present

## 2021-11-30 DIAGNOSIS — E039 Hypothyroidism, unspecified: Secondary | ICD-10-CM | POA: Diagnosis not present

## 2021-11-30 DIAGNOSIS — I495 Sick sinus syndrome: Secondary | ICD-10-CM | POA: Diagnosis not present

## 2021-11-30 DIAGNOSIS — Z950 Presence of cardiac pacemaker: Secondary | ICD-10-CM | POA: Diagnosis not present

## 2021-11-30 DIAGNOSIS — Z1612 Extended spectrum beta lactamase (ESBL) resistance: Secondary | ICD-10-CM | POA: Diagnosis not present

## 2021-11-30 DIAGNOSIS — D696 Thrombocytopenia, unspecified: Secondary | ICD-10-CM | POA: Diagnosis not present

## 2021-11-30 DIAGNOSIS — I129 Hypertensive chronic kidney disease with stage 1 through stage 4 chronic kidney disease, or unspecified chronic kidney disease: Secondary | ICD-10-CM | POA: Diagnosis not present

## 2021-11-30 DIAGNOSIS — I5042 Chronic combined systolic (congestive) and diastolic (congestive) heart failure: Secondary | ICD-10-CM | POA: Diagnosis not present

## 2021-11-30 DIAGNOSIS — K5900 Constipation, unspecified: Secondary | ICD-10-CM | POA: Diagnosis not present

## 2021-11-30 DIAGNOSIS — G250 Essential tremor: Secondary | ICD-10-CM | POA: Diagnosis not present

## 2021-11-30 DIAGNOSIS — I480 Paroxysmal atrial fibrillation: Secondary | ICD-10-CM | POA: Diagnosis not present

## 2021-11-30 DIAGNOSIS — N401 Enlarged prostate with lower urinary tract symptoms: Secondary | ICD-10-CM | POA: Diagnosis not present

## 2021-11-30 DIAGNOSIS — Z87440 Personal history of urinary (tract) infections: Secondary | ICD-10-CM | POA: Diagnosis not present

## 2021-11-30 DIAGNOSIS — Z7901 Long term (current) use of anticoagulants: Secondary | ICD-10-CM | POA: Diagnosis not present

## 2021-11-30 DIAGNOSIS — Z5181 Encounter for therapeutic drug level monitoring: Secondary | ICD-10-CM | POA: Diagnosis not present

## 2021-11-30 DIAGNOSIS — N1832 Chronic kidney disease, stage 3b: Secondary | ICD-10-CM | POA: Diagnosis not present

## 2021-12-03 DIAGNOSIS — Z466 Encounter for fitting and adjustment of urinary device: Secondary | ICD-10-CM | POA: Diagnosis not present

## 2021-12-03 DIAGNOSIS — I5042 Chronic combined systolic (congestive) and diastolic (congestive) heart failure: Secondary | ICD-10-CM | POA: Diagnosis not present

## 2021-12-03 DIAGNOSIS — I129 Hypertensive chronic kidney disease with stage 1 through stage 4 chronic kidney disease, or unspecified chronic kidney disease: Secondary | ICD-10-CM | POA: Diagnosis not present

## 2021-12-03 DIAGNOSIS — I480 Paroxysmal atrial fibrillation: Secondary | ICD-10-CM | POA: Diagnosis not present

## 2021-12-03 DIAGNOSIS — N1832 Chronic kidney disease, stage 3b: Secondary | ICD-10-CM | POA: Diagnosis not present

## 2021-12-03 DIAGNOSIS — N401 Enlarged prostate with lower urinary tract symptoms: Secondary | ICD-10-CM | POA: Diagnosis not present

## 2021-12-04 DIAGNOSIS — I5042 Chronic combined systolic (congestive) and diastolic (congestive) heart failure: Secondary | ICD-10-CM | POA: Diagnosis not present

## 2021-12-04 DIAGNOSIS — I480 Paroxysmal atrial fibrillation: Secondary | ICD-10-CM | POA: Diagnosis not present

## 2021-12-04 DIAGNOSIS — N401 Enlarged prostate with lower urinary tract symptoms: Secondary | ICD-10-CM | POA: Diagnosis not present

## 2021-12-04 DIAGNOSIS — N1832 Chronic kidney disease, stage 3b: Secondary | ICD-10-CM | POA: Diagnosis not present

## 2021-12-04 DIAGNOSIS — I129 Hypertensive chronic kidney disease with stage 1 through stage 4 chronic kidney disease, or unspecified chronic kidney disease: Secondary | ICD-10-CM | POA: Diagnosis not present

## 2021-12-04 DIAGNOSIS — Z466 Encounter for fitting and adjustment of urinary device: Secondary | ICD-10-CM | POA: Diagnosis not present

## 2021-12-07 DIAGNOSIS — I5042 Chronic combined systolic (congestive) and diastolic (congestive) heart failure: Secondary | ICD-10-CM | POA: Diagnosis not present

## 2021-12-07 DIAGNOSIS — N401 Enlarged prostate with lower urinary tract symptoms: Secondary | ICD-10-CM | POA: Diagnosis not present

## 2021-12-07 DIAGNOSIS — Z466 Encounter for fitting and adjustment of urinary device: Secondary | ICD-10-CM | POA: Diagnosis not present

## 2021-12-07 DIAGNOSIS — N1832 Chronic kidney disease, stage 3b: Secondary | ICD-10-CM | POA: Diagnosis not present

## 2021-12-07 DIAGNOSIS — I480 Paroxysmal atrial fibrillation: Secondary | ICD-10-CM | POA: Diagnosis not present

## 2021-12-07 DIAGNOSIS — I129 Hypertensive chronic kidney disease with stage 1 through stage 4 chronic kidney disease, or unspecified chronic kidney disease: Secondary | ICD-10-CM | POA: Diagnosis not present

## 2021-12-10 DIAGNOSIS — I5042 Chronic combined systolic (congestive) and diastolic (congestive) heart failure: Secondary | ICD-10-CM | POA: Diagnosis not present

## 2021-12-10 DIAGNOSIS — N401 Enlarged prostate with lower urinary tract symptoms: Secondary | ICD-10-CM | POA: Diagnosis not present

## 2021-12-10 DIAGNOSIS — I129 Hypertensive chronic kidney disease with stage 1 through stage 4 chronic kidney disease, or unspecified chronic kidney disease: Secondary | ICD-10-CM | POA: Diagnosis not present

## 2021-12-10 DIAGNOSIS — N1832 Chronic kidney disease, stage 3b: Secondary | ICD-10-CM | POA: Diagnosis not present

## 2021-12-10 DIAGNOSIS — I480 Paroxysmal atrial fibrillation: Secondary | ICD-10-CM | POA: Diagnosis not present

## 2021-12-10 DIAGNOSIS — Z466 Encounter for fitting and adjustment of urinary device: Secondary | ICD-10-CM | POA: Diagnosis not present

## 2021-12-11 ENCOUNTER — Ambulatory Visit (INDEPENDENT_AMBULATORY_CARE_PROVIDER_SITE_OTHER): Payer: Medicare Other | Admitting: *Deleted

## 2021-12-11 DIAGNOSIS — Z5181 Encounter for therapeutic drug level monitoring: Secondary | ICD-10-CM | POA: Diagnosis not present

## 2021-12-11 DIAGNOSIS — Z466 Encounter for fitting and adjustment of urinary device: Secondary | ICD-10-CM | POA: Diagnosis not present

## 2021-12-11 DIAGNOSIS — I5042 Chronic combined systolic (congestive) and diastolic (congestive) heart failure: Secondary | ICD-10-CM | POA: Diagnosis not present

## 2021-12-11 DIAGNOSIS — I129 Hypertensive chronic kidney disease with stage 1 through stage 4 chronic kidney disease, or unspecified chronic kidney disease: Secondary | ICD-10-CM | POA: Diagnosis not present

## 2021-12-11 DIAGNOSIS — N401 Enlarged prostate with lower urinary tract symptoms: Secondary | ICD-10-CM | POA: Diagnosis not present

## 2021-12-11 DIAGNOSIS — R338 Other retention of urine: Secondary | ICD-10-CM | POA: Diagnosis not present

## 2021-12-11 DIAGNOSIS — I480 Paroxysmal atrial fibrillation: Secondary | ICD-10-CM | POA: Diagnosis not present

## 2021-12-11 DIAGNOSIS — N1832 Chronic kidney disease, stage 3b: Secondary | ICD-10-CM | POA: Diagnosis not present

## 2021-12-11 LAB — POCT INR: INR: 2.2 (ref 2.0–3.0)

## 2021-12-14 DIAGNOSIS — I5042 Chronic combined systolic (congestive) and diastolic (congestive) heart failure: Secondary | ICD-10-CM | POA: Diagnosis not present

## 2021-12-14 DIAGNOSIS — Z87440 Personal history of urinary (tract) infections: Secondary | ICD-10-CM | POA: Diagnosis not present

## 2021-12-14 DIAGNOSIS — I5032 Chronic diastolic (congestive) heart failure: Secondary | ICD-10-CM | POA: Diagnosis not present

## 2021-12-14 DIAGNOSIS — R269 Unspecified abnormalities of gait and mobility: Secondary | ICD-10-CM | POA: Diagnosis not present

## 2021-12-14 DIAGNOSIS — M170 Bilateral primary osteoarthritis of knee: Secondary | ICD-10-CM | POA: Diagnosis not present

## 2021-12-14 DIAGNOSIS — N401 Enlarged prostate with lower urinary tract symptoms: Secondary | ICD-10-CM | POA: Diagnosis not present

## 2021-12-18 DIAGNOSIS — I5042 Chronic combined systolic (congestive) and diastolic (congestive) heart failure: Secondary | ICD-10-CM | POA: Diagnosis not present

## 2021-12-18 DIAGNOSIS — N401 Enlarged prostate with lower urinary tract symptoms: Secondary | ICD-10-CM | POA: Diagnosis not present

## 2021-12-18 DIAGNOSIS — N1832 Chronic kidney disease, stage 3b: Secondary | ICD-10-CM | POA: Diagnosis not present

## 2021-12-18 DIAGNOSIS — I129 Hypertensive chronic kidney disease with stage 1 through stage 4 chronic kidney disease, or unspecified chronic kidney disease: Secondary | ICD-10-CM | POA: Diagnosis not present

## 2021-12-18 DIAGNOSIS — Z466 Encounter for fitting and adjustment of urinary device: Secondary | ICD-10-CM | POA: Diagnosis not present

## 2021-12-18 DIAGNOSIS — I480 Paroxysmal atrial fibrillation: Secondary | ICD-10-CM | POA: Diagnosis not present

## 2021-12-20 DIAGNOSIS — I130 Hypertensive heart and chronic kidney disease with heart failure and stage 1 through stage 4 chronic kidney disease, or unspecified chronic kidney disease: Secondary | ICD-10-CM | POA: Diagnosis not present

## 2021-12-20 DIAGNOSIS — I10 Essential (primary) hypertension: Secondary | ICD-10-CM | POA: Diagnosis not present

## 2021-12-20 DIAGNOSIS — E782 Mixed hyperlipidemia: Secondary | ICD-10-CM | POA: Diagnosis not present

## 2021-12-20 DIAGNOSIS — I5042 Chronic combined systolic (congestive) and diastolic (congestive) heart failure: Secondary | ICD-10-CM | POA: Diagnosis not present

## 2021-12-20 DIAGNOSIS — I480 Paroxysmal atrial fibrillation: Secondary | ICD-10-CM | POA: Diagnosis not present

## 2021-12-20 DIAGNOSIS — N183 Chronic kidney disease, stage 3 unspecified: Secondary | ICD-10-CM | POA: Diagnosis not present

## 2021-12-20 DIAGNOSIS — I5032 Chronic diastolic (congestive) heart failure: Secondary | ICD-10-CM | POA: Diagnosis not present

## 2021-12-25 ENCOUNTER — Telehealth: Payer: Self-pay

## 2021-12-25 NOTE — Telephone Encounter (Signed)
Spoke with patient's son Douglas Walker and scheduled an in-person Palliative Consult for 01/10/22 @ 10 AM with Dr. Hollace Kinnier. Documentation will be noted in [facility].   COVID screening was negative. Pets in the home will put away. Patient's son, DIL, and grandson lives with him. Patient's last day receiving services through [facility] will be 12/28/21.   Consent obtained; updated Outlook/Netsmart/Team List and Epic.   Family is aware they may be receiving a call from provider the day before or day of to confirm appointment.

## 2021-12-28 ENCOUNTER — Telehealth: Payer: Self-pay | Admitting: *Deleted

## 2021-12-28 NOTE — Telephone Encounter (Signed)
Pt was suppose to have INR checked today by home health. However, home health nurse called and was not able to check INR today. Gave order for nurse to go check INR on Tuesday 01/01/2022 and call results to the coumadin clinic.

## 2021-12-30 DIAGNOSIS — Z7901 Long term (current) use of anticoagulants: Secondary | ICD-10-CM | POA: Diagnosis not present

## 2021-12-30 DIAGNOSIS — E039 Hypothyroidism, unspecified: Secondary | ICD-10-CM | POA: Diagnosis not present

## 2021-12-30 DIAGNOSIS — Z8673 Personal history of transient ischemic attack (TIA), and cerebral infarction without residual deficits: Secondary | ICD-10-CM | POA: Diagnosis not present

## 2021-12-30 DIAGNOSIS — N401 Enlarged prostate with lower urinary tract symptoms: Secondary | ICD-10-CM | POA: Diagnosis not present

## 2021-12-30 DIAGNOSIS — G250 Essential tremor: Secondary | ICD-10-CM | POA: Diagnosis not present

## 2021-12-30 DIAGNOSIS — Z466 Encounter for fitting and adjustment of urinary device: Secondary | ICD-10-CM | POA: Diagnosis not present

## 2021-12-30 DIAGNOSIS — Z87440 Personal history of urinary (tract) infections: Secondary | ICD-10-CM | POA: Diagnosis not present

## 2021-12-30 DIAGNOSIS — I5042 Chronic combined systolic (congestive) and diastolic (congestive) heart failure: Secondary | ICD-10-CM | POA: Diagnosis not present

## 2021-12-30 DIAGNOSIS — D696 Thrombocytopenia, unspecified: Secondary | ICD-10-CM | POA: Diagnosis not present

## 2021-12-30 DIAGNOSIS — Z9181 History of falling: Secondary | ICD-10-CM | POA: Diagnosis not present

## 2021-12-30 DIAGNOSIS — Z1612 Extended spectrum beta lactamase (ESBL) resistance: Secondary | ICD-10-CM | POA: Diagnosis not present

## 2021-12-30 DIAGNOSIS — I130 Hypertensive heart and chronic kidney disease with heart failure and stage 1 through stage 4 chronic kidney disease, or unspecified chronic kidney disease: Secondary | ICD-10-CM | POA: Diagnosis not present

## 2021-12-30 DIAGNOSIS — Z950 Presence of cardiac pacemaker: Secondary | ICD-10-CM | POA: Diagnosis not present

## 2021-12-30 DIAGNOSIS — N1832 Chronic kidney disease, stage 3b: Secondary | ICD-10-CM | POA: Diagnosis not present

## 2021-12-30 DIAGNOSIS — I480 Paroxysmal atrial fibrillation: Secondary | ICD-10-CM | POA: Diagnosis not present

## 2021-12-30 DIAGNOSIS — I495 Sick sinus syndrome: Secondary | ICD-10-CM | POA: Diagnosis not present

## 2021-12-30 DIAGNOSIS — Z5181 Encounter for therapeutic drug level monitoring: Secondary | ICD-10-CM | POA: Diagnosis not present

## 2022-01-01 ENCOUNTER — Ambulatory Visit (INDEPENDENT_AMBULATORY_CARE_PROVIDER_SITE_OTHER): Payer: Medicare Other

## 2022-01-01 DIAGNOSIS — N401 Enlarged prostate with lower urinary tract symptoms: Secondary | ICD-10-CM | POA: Diagnosis not present

## 2022-01-01 DIAGNOSIS — Z5181 Encounter for therapeutic drug level monitoring: Secondary | ICD-10-CM

## 2022-01-01 DIAGNOSIS — I130 Hypertensive heart and chronic kidney disease with heart failure and stage 1 through stage 4 chronic kidney disease, or unspecified chronic kidney disease: Secondary | ICD-10-CM | POA: Diagnosis not present

## 2022-01-01 DIAGNOSIS — Z466 Encounter for fitting and adjustment of urinary device: Secondary | ICD-10-CM | POA: Diagnosis not present

## 2022-01-01 DIAGNOSIS — R338 Other retention of urine: Secondary | ICD-10-CM | POA: Diagnosis not present

## 2022-01-01 DIAGNOSIS — N1832 Chronic kidney disease, stage 3b: Secondary | ICD-10-CM | POA: Diagnosis not present

## 2022-01-01 DIAGNOSIS — I480 Paroxysmal atrial fibrillation: Secondary | ICD-10-CM

## 2022-01-01 DIAGNOSIS — I5042 Chronic combined systolic (congestive) and diastolic (congestive) heart failure: Secondary | ICD-10-CM | POA: Diagnosis not present

## 2022-01-01 LAB — POCT INR: INR: 1.7 — AB (ref 2.0–3.0)

## 2022-01-01 NOTE — Patient Instructions (Signed)
Spoke with Glenard Haring with [facility] and advised her to have pt take 2 tablets warfarin tonight, then continue taking warfarin 1.5 tablets daily. Recheck INR in 2 weeks-prior to [facility] d/c. Coumadin Clinic [phone].

## 2022-01-02 ENCOUNTER — Encounter: Payer: Self-pay | Admitting: Internal Medicine

## 2022-01-03 ENCOUNTER — Other Ambulatory Visit: Payer: Self-pay

## 2022-01-03 MED ORDER — NITROFURANTOIN MONOHYD MACRO 100 MG PO CAPS: 100.0000 mg | ORAL_CAPSULE | Freq: Every day | ORAL | 1 refills | Status: AC

## 2022-01-10 DIAGNOSIS — Z515 Encounter for palliative care: Secondary | ICD-10-CM | POA: Diagnosis not present

## 2022-01-10 DIAGNOSIS — I69391 Dysphagia following cerebral infarction: Secondary | ICD-10-CM | POA: Diagnosis not present

## 2022-01-10 DIAGNOSIS — R338 Other retention of urine: Secondary | ICD-10-CM | POA: Diagnosis not present

## 2022-01-10 DIAGNOSIS — I69393 Ataxia following cerebral infarction: Secondary | ICD-10-CM | POA: Diagnosis not present

## 2022-01-10 DIAGNOSIS — I69320 Aphasia following cerebral infarction: Secondary | ICD-10-CM | POA: Diagnosis not present

## 2022-01-10 DIAGNOSIS — M792 Neuralgia and neuritis, unspecified: Secondary | ICD-10-CM | POA: Diagnosis not present

## 2022-01-15 ENCOUNTER — Ambulatory Visit (INDEPENDENT_AMBULATORY_CARE_PROVIDER_SITE_OTHER): Payer: Medicare Other | Admitting: *Deleted

## 2022-01-15 DIAGNOSIS — Z5181 Encounter for therapeutic drug level monitoring: Secondary | ICD-10-CM

## 2022-01-15 DIAGNOSIS — I480 Paroxysmal atrial fibrillation: Secondary | ICD-10-CM | POA: Diagnosis not present

## 2022-01-15 DIAGNOSIS — I130 Hypertensive heart and chronic kidney disease with heart failure and stage 1 through stage 4 chronic kidney disease, or unspecified chronic kidney disease: Secondary | ICD-10-CM | POA: Diagnosis not present

## 2022-01-15 DIAGNOSIS — Z466 Encounter for fitting and adjustment of urinary device: Secondary | ICD-10-CM | POA: Diagnosis not present

## 2022-01-15 DIAGNOSIS — N401 Enlarged prostate with lower urinary tract symptoms: Secondary | ICD-10-CM | POA: Diagnosis not present

## 2022-01-15 DIAGNOSIS — N1832 Chronic kidney disease, stage 3b: Secondary | ICD-10-CM | POA: Diagnosis not present

## 2022-01-15 DIAGNOSIS — I5042 Chronic combined systolic (congestive) and diastolic (congestive) heart failure: Secondary | ICD-10-CM | POA: Diagnosis not present

## 2022-01-15 LAB — POCT INR: INR: 2.1 (ref 2.0–3.0)

## 2022-01-15 NOTE — Patient Instructions (Signed)
Description   Spoke with Glenard Haring with [facility] and advised her to have pt continue taking warfarin 1.5 tablets daily. Recheck INR in 2 weeks-will be d/c from [facility] in 4 weeks. Coumadin Clinic [phone].

## 2022-01-17 ENCOUNTER — Ambulatory Visit (INDEPENDENT_AMBULATORY_CARE_PROVIDER_SITE_OTHER): Payer: Medicare Other

## 2022-01-17 DIAGNOSIS — I495 Sick sinus syndrome: Secondary | ICD-10-CM | POA: Diagnosis not present

## 2022-01-17 LAB — CUP PACEART REMOTE DEVICE CHECK
Battery Remaining Longevity: 29 mo
Battery Voltage: 2.93 V
Brady Statistic AP VP Percent: 99.86 %
Brady Statistic AP VS Percent: 0 %
Brady Statistic AS VP Percent: 0 %
Brady Statistic AS VS Percent: 0.14 %
Brady Statistic RA Percent Paced: 100 %
Brady Statistic RV Percent Paced: 99.86 %
Date Time Interrogation Session: 20230119061435
Implantable Lead Implant Date: 20191206
Implantable Lead Implant Date: 20191206
Implantable Lead Location: 753860
Implantable Lead Location: 753860
Implantable Lead Model: 3830
Implantable Lead Model: 5076
Implantable Pulse Generator Implant Date: 20191206
Lead Channel Impedance Value: 304 Ohm
Lead Channel Impedance Value: 304 Ohm
Lead Channel Impedance Value: 380 Ohm
Lead Channel Impedance Value: 399 Ohm
Lead Channel Sensing Intrinsic Amplitude: 2.875 mV
Lead Channel Sensing Intrinsic Amplitude: 4.25 mV
Lead Channel Sensing Intrinsic Amplitude: 5 mV
Lead Channel Sensing Intrinsic Amplitude: 5 mV
Lead Channel Setting Pacing Amplitude: 2 V
Lead Channel Setting Pacing Amplitude: 2.5 V
Lead Channel Setting Pacing Pulse Width: 0.4 ms
Lead Channel Setting Sensing Sensitivity: 1.2 mV

## 2022-01-23 DIAGNOSIS — I5042 Chronic combined systolic (congestive) and diastolic (congestive) heart failure: Secondary | ICD-10-CM | POA: Diagnosis not present

## 2022-01-23 DIAGNOSIS — I130 Hypertensive heart and chronic kidney disease with heart failure and stage 1 through stage 4 chronic kidney disease, or unspecified chronic kidney disease: Secondary | ICD-10-CM | POA: Diagnosis not present

## 2022-01-23 DIAGNOSIS — N401 Enlarged prostate with lower urinary tract symptoms: Secondary | ICD-10-CM | POA: Diagnosis not present

## 2022-01-23 DIAGNOSIS — R338 Other retention of urine: Secondary | ICD-10-CM | POA: Diagnosis not present

## 2022-01-23 DIAGNOSIS — N1832 Chronic kidney disease, stage 3b: Secondary | ICD-10-CM | POA: Diagnosis not present

## 2022-01-23 DIAGNOSIS — Z466 Encounter for fitting and adjustment of urinary device: Secondary | ICD-10-CM | POA: Diagnosis not present

## 2022-01-23 DIAGNOSIS — I480 Paroxysmal atrial fibrillation: Secondary | ICD-10-CM | POA: Diagnosis not present

## 2022-01-24 DIAGNOSIS — N401 Enlarged prostate with lower urinary tract symptoms: Secondary | ICD-10-CM | POA: Diagnosis not present

## 2022-01-24 DIAGNOSIS — I5042 Chronic combined systolic (congestive) and diastolic (congestive) heart failure: Secondary | ICD-10-CM | POA: Diagnosis not present

## 2022-01-24 DIAGNOSIS — Z466 Encounter for fitting and adjustment of urinary device: Secondary | ICD-10-CM | POA: Diagnosis not present

## 2022-01-24 DIAGNOSIS — N1832 Chronic kidney disease, stage 3b: Secondary | ICD-10-CM | POA: Diagnosis not present

## 2022-01-24 DIAGNOSIS — I480 Paroxysmal atrial fibrillation: Secondary | ICD-10-CM | POA: Diagnosis not present

## 2022-01-24 DIAGNOSIS — I130 Hypertensive heart and chronic kidney disease with heart failure and stage 1 through stage 4 chronic kidney disease, or unspecified chronic kidney disease: Secondary | ICD-10-CM | POA: Diagnosis not present

## 2022-01-25 DIAGNOSIS — N1832 Chronic kidney disease, stage 3b: Secondary | ICD-10-CM | POA: Diagnosis not present

## 2022-01-25 DIAGNOSIS — I5042 Chronic combined systolic (congestive) and diastolic (congestive) heart failure: Secondary | ICD-10-CM | POA: Diagnosis not present

## 2022-01-25 DIAGNOSIS — I130 Hypertensive heart and chronic kidney disease with heart failure and stage 1 through stage 4 chronic kidney disease, or unspecified chronic kidney disease: Secondary | ICD-10-CM | POA: Diagnosis not present

## 2022-01-25 DIAGNOSIS — I480 Paroxysmal atrial fibrillation: Secondary | ICD-10-CM | POA: Diagnosis not present

## 2022-01-25 DIAGNOSIS — N401 Enlarged prostate with lower urinary tract symptoms: Secondary | ICD-10-CM | POA: Diagnosis not present

## 2022-01-25 DIAGNOSIS — Z466 Encounter for fitting and adjustment of urinary device: Secondary | ICD-10-CM | POA: Diagnosis not present

## 2022-01-29 ENCOUNTER — Ambulatory Visit (INDEPENDENT_AMBULATORY_CARE_PROVIDER_SITE_OTHER): Payer: Medicare Other | Admitting: *Deleted

## 2022-01-29 DIAGNOSIS — Z466 Encounter for fitting and adjustment of urinary device: Secondary | ICD-10-CM | POA: Diagnosis not present

## 2022-01-29 DIAGNOSIS — I480 Paroxysmal atrial fibrillation: Secondary | ICD-10-CM

## 2022-01-29 DIAGNOSIS — Z87440 Personal history of urinary (tract) infections: Secondary | ICD-10-CM | POA: Diagnosis not present

## 2022-01-29 DIAGNOSIS — I5042 Chronic combined systolic (congestive) and diastolic (congestive) heart failure: Secondary | ICD-10-CM | POA: Diagnosis not present

## 2022-01-29 DIAGNOSIS — E039 Hypothyroidism, unspecified: Secondary | ICD-10-CM | POA: Diagnosis not present

## 2022-01-29 DIAGNOSIS — Z5181 Encounter for therapeutic drug level monitoring: Secondary | ICD-10-CM | POA: Diagnosis not present

## 2022-01-29 DIAGNOSIS — N1832 Chronic kidney disease, stage 3b: Secondary | ICD-10-CM | POA: Diagnosis not present

## 2022-01-29 DIAGNOSIS — I495 Sick sinus syndrome: Secondary | ICD-10-CM | POA: Diagnosis not present

## 2022-01-29 DIAGNOSIS — D696 Thrombocytopenia, unspecified: Secondary | ICD-10-CM | POA: Diagnosis not present

## 2022-01-29 DIAGNOSIS — N401 Enlarged prostate with lower urinary tract symptoms: Secondary | ICD-10-CM | POA: Diagnosis not present

## 2022-01-29 DIAGNOSIS — Z1612 Extended spectrum beta lactamase (ESBL) resistance: Secondary | ICD-10-CM | POA: Diagnosis not present

## 2022-01-29 DIAGNOSIS — Z7901 Long term (current) use of anticoagulants: Secondary | ICD-10-CM | POA: Diagnosis not present

## 2022-01-29 DIAGNOSIS — I130 Hypertensive heart and chronic kidney disease with heart failure and stage 1 through stage 4 chronic kidney disease, or unspecified chronic kidney disease: Secondary | ICD-10-CM | POA: Diagnosis not present

## 2022-01-29 DIAGNOSIS — Z950 Presence of cardiac pacemaker: Secondary | ICD-10-CM | POA: Diagnosis not present

## 2022-01-29 DIAGNOSIS — Z9181 History of falling: Secondary | ICD-10-CM | POA: Diagnosis not present

## 2022-01-29 DIAGNOSIS — Z8673 Personal history of transient ischemic attack (TIA), and cerebral infarction without residual deficits: Secondary | ICD-10-CM | POA: Diagnosis not present

## 2022-01-29 DIAGNOSIS — G250 Essential tremor: Secondary | ICD-10-CM | POA: Diagnosis not present

## 2022-01-29 LAB — POCT INR: INR: 1.8 — AB (ref 2.0–3.0)

## 2022-01-29 NOTE — Patient Instructions (Addendum)
Description   Spoke with Glenard Haring with [facility] and advised her to have pt take 2 tablets today then continue taking warfarin 1.5 tablets daily. Recheck INR in 2 weeks. Coumadin Clinic [phone].

## 2022-01-30 DIAGNOSIS — I5042 Chronic combined systolic (congestive) and diastolic (congestive) heart failure: Secondary | ICD-10-CM | POA: Diagnosis not present

## 2022-01-30 DIAGNOSIS — Z466 Encounter for fitting and adjustment of urinary device: Secondary | ICD-10-CM | POA: Diagnosis not present

## 2022-01-30 DIAGNOSIS — N1832 Chronic kidney disease, stage 3b: Secondary | ICD-10-CM | POA: Diagnosis not present

## 2022-01-30 DIAGNOSIS — I130 Hypertensive heart and chronic kidney disease with heart failure and stage 1 through stage 4 chronic kidney disease, or unspecified chronic kidney disease: Secondary | ICD-10-CM | POA: Diagnosis not present

## 2022-01-30 DIAGNOSIS — I480 Paroxysmal atrial fibrillation: Secondary | ICD-10-CM | POA: Diagnosis not present

## 2022-01-30 DIAGNOSIS — N401 Enlarged prostate with lower urinary tract symptoms: Secondary | ICD-10-CM | POA: Diagnosis not present

## 2022-01-30 NOTE — Progress Notes (Signed)
Remote pacemaker transmission.   

## 2022-01-31 ENCOUNTER — Ambulatory Visit (INDEPENDENT_AMBULATORY_CARE_PROVIDER_SITE_OTHER): Payer: Medicare Other | Admitting: Infectious Diseases

## 2022-01-31 ENCOUNTER — Other Ambulatory Visit: Payer: Self-pay

## 2022-01-31 ENCOUNTER — Encounter: Payer: Self-pay | Admitting: Infectious Diseases

## 2022-01-31 VITALS — BP 122/58 | HR 73 | Temp 97.4°F | Wt 203.0 lb

## 2022-01-31 DIAGNOSIS — N401 Enlarged prostate with lower urinary tract symptoms: Secondary | ICD-10-CM | POA: Diagnosis not present

## 2022-01-31 DIAGNOSIS — Z5181 Encounter for therapeutic drug level monitoring: Secondary | ICD-10-CM | POA: Diagnosis not present

## 2022-01-31 DIAGNOSIS — Z978 Presence of other specified devices: Secondary | ICD-10-CM | POA: Diagnosis not present

## 2022-01-31 DIAGNOSIS — Z1624 Resistance to multiple antibiotics: Secondary | ICD-10-CM

## 2022-01-31 DIAGNOSIS — N390 Urinary tract infection, site not specified: Secondary | ICD-10-CM | POA: Insufficient documentation

## 2022-01-31 NOTE — Progress Notes (Signed)
Patient Active Problem List   Diagnosis Date Noted   Bacteremia due to Proteus species 10/11/2021   Sepsis secondary to UTI ([place]) 10/09/2021   AKI (acute kidney injury) ([place]) 07/06/2021   UTI (urinary tract infection) 07/02/2021   Pain in both testicles    Right hemiparesis ([place]) 04/23/2021   Cerebrovascular accident (CVA) due to embolism of left middle cerebral artery ([place]) 04/23/2021   Neuropathy 04/23/2021   Abnormality of gait 04/23/2021   Abnormal results of thyroid function studies 04/04/2021   Acquired iron deficiency anemia due to decreased absorption 04/04/2021   Acquired thrombophilia ([place]) 04/04/2021   Cerebral atherosclerosis 04/04/2021   Basal cell carcinoma of nose 04/04/2021   Benign hypertensive heart and renal disease, with heart and renal failure ([place]) 04/04/2021   Benign prostatic hyperplasia 04/04/2021   Obesity 04/04/2021   Bradycardia 04/04/2021   Closed fracture of one rib 04/04/2021   Constipation by delayed colonic transit 04/04/2021   Fatigue 04/04/2021   Gastro-esophageal reflux disease without esophagitis 04/04/2021   Hypothyroidism 04/04/2021   Left lower quadrant pain 04/04/2021   Lumbar radiculopathy 04/04/2021   Reactive depression 04/04/2021   Malignant neoplasm of anterior wall of urinary bladder ([place]) 04/04/2021   Mass of chest wall 04/04/2021   Mixed hyperlipidemia 04/04/2021   Nocturia 04/04/2021   Other shoulder lesions, right shoulder 04/04/2021   Pain in limb 04/04/2021   Peripheral edema 04/04/2021   Personal history of malignant neoplasm of bladder 04/04/2021   Pruritus of genitalia 04/04/2021   Tremor 04/04/2021   Unilateral primary osteoarthritis, left knee 04/04/2021   Unspecified mononeuropathy of unspecified lower limb 04/04/2021   Urinary incontinence 04/04/2021   Dysphagia, post-stroke    Chronic diastolic congestive heart failure (HCC)    Chronic pain syndrome    Left middle cerebral artery stroke ([place])  02/01/2021   Acute CVA (cerebrovascular accident) ([place]) 01/28/2021   Pacemaker 08/30/2020   Hematoma, chest wall 07/24/2020   Exudative age-related macular degeneration of right eye with inactive choroidal neovascularization ([place]) 05/10/2020   Exudative age-related macular degeneration of left eye with inactive choroidal neovascularization ([place]) 05/10/2020   Retinal hemorrhage of right eye 05/10/2020   Exposure keratopathy, bilateral 05/10/2020   Bilateral dry eyes 05/10/2020   Advanced nonexudative age-related macular degeneration of both eyes with subfoveal involvement 05/10/2020   Sick sinus syndrome ([place]) 12/06/2018   Complete heart block (HCC) 12/04/2018   Chronic low back pain 12/15/2017   Hereditary and idiopathic peripheral neuropathy 07/01/2016   Essential tremor 02/28/2016   BPPV (benign paroxysmal positional vertigo) 06/19/2015   Dizziness 06/18/2015   Bacteria in urine 06/18/2015   CKD (chronic kidney disease) stage 3, GFR 30-59 ml/min: Probable 06/18/2015   Chronic diastolic CHF (congestive heart failure) ([place]) 05/30/2015   Fall    Syncope 05/19/2015   Lower urinary tract infectious disease 05/19/2015   Hypotension 05/19/2015   Dehydration 05/19/2015   Fracture of rib of left side 05/19/2015   Syncope and collapse 05/19/2015   Mobitz type II atrioventricular block 09/13/2014   Essential hypertension 09/13/2014   Chronic anticoagulation 09/13/2014   Tachycardia-bradycardia syndrome ([place]) 09/13/2014   Paroxysmal atrial fibrillation ([place]) 10/28/2013   Thrombocytopenia ([place]) 04/06/2013   Current Outpatient Medications on File Prior to Visit  Medication Sig Dispense Refill   acetaminophen (TYLENOL) 325 MG tablet Take 2 tablets (650 mg total) by mouth every 4 (four) hours as needed for mild pain (or temp > 37.5 C (99.5 F)).  Ascorbic Acid (VITAMIN C) 1000 MG tablet Take 1,000 mg by mouth daily.     DULoxetine (CYMBALTA) 60 MG capsule Take 1 capsule (60 mg total) by  mouth daily. 30 capsule 11   furosemide (LASIX) 20 MG tablet Take 2 tablets (40 mg total) by mouth daily. 60 tablet 0   levothyroxine (SYNTHROID) 25 MCG tablet Take 1 tablet (25 mcg total) by mouth daily before breakfast. 90 tablet 1   loratadine (CLARITIN) 10 MG tablet Take 10 mg by mouth daily.     Multiple Vitamin (MULTIVITAMIN WITH MINERALS) TABS tablet Take 1 tablet by mouth daily.     Multiple Vitamins-Minerals (OCUVITE ADULT 50+) CAPS Take 1 capsule by mouth daily.     nitrofurantoin, macrocrystal-monohydrate, (MACROBID) 100 MG capsule Take 1 capsule (100 mg total) by mouth at bedtime. 90 capsule 1   OVER THE COUNTER MEDICATION Take 15,000 mg by mouth 2 (two) times daily. PT takes a Cranberry Supplement that treats UTI 1 cap twice a day     phenazopyridine (PYRIDIUM) 200 MG tablet Take 200 mg by mouth See admin instructions. 200mg  oral at night And 200mg  extra twice daily as needed for bladder spasms     polyethylene glycol (MIRALAX / GLYCOLAX) 17 g packet Take 17 g by mouth daily. (Patient taking differently: Take 17 g by mouth daily as needed for mild constipation.) 30 each 0   pregabalin (LYRICA) 25 MG capsule Take 2 capsules (50 mg total) by mouth at bedtime. TAKE 1 CAPSULE(25 MG) BY MOUTH AT BEDTIME 180 capsule 1   primidone (MYSOLINE) 250 MG tablet Take 1 tablet (250 mg total) by mouth 2 (two) times daily. (Patient taking differently: Take 250 mg by mouth 2 (two) times daily. Taking 100mg ) 60 tablet 11   topiramate (TOPAMAX) 50 MG tablet Take 1 tablet (50 mg total) by mouth 2 (two) times daily. 180 tablet 3   traMADol (ULTRAM) 50 MG tablet Take 50-100 mg by mouth 3 (three) times daily as needed.     vitamin B-12 (CYANOCOBALAMIN) 1000 MCG tablet Take 1 tablet (1,000 mcg total) by mouth every other day. (Patient taking differently: Take 1,000 mcg by mouth See admin instructions. 1018mcg oral daily on Sunday, Tuesday, Wednesday, Thursday, repeat.) 30 tablet 0   warfarin (COUMADIN) 5 MG  tablet Take 1 to 1.5 tablets daily as directed by Coumadin clinic (Patient taking differently: Take 7.5-10 mg by mouth See admin instructions. 7.5mg  on everyday except Friday. Friday take 10mg ) 45 tablet 3   No current facility-administered medications on file prior to visit.      Subjective: 88 YO male with PMH of CVA s/p rt sided weakness, PAFib/Tachy-Brady syndome, AV Block s/p PPM, HTN, HLD, dCHF, GERD, Hypothyroidism, CKD, Essential tremor, Neuropathy. BCC of nose, low grade transitional cell ca of bladder 2005  with no recurrence on subsequent cystoscopy/BPH/recurrent UTI s/p chronic indwelling foleys catheter, Depression, age related macular degeneration of bilateral eyes here for regular follow up for recurrent UTI/MDRO UTI.   Last seen by Dr Gale Journey on 11/01/21. Prior notes/Labs/Imaging/Microbiologic data reviewed.   Accompanied by son. Denies any h/o UTI since seen by Dr Gale Journey last in the clinic in Nov 2022. Follows Dr Lovena Neighbours at [facility] urology where he gets his [facility] changed every 3 weeks. Last changed was a week ago.   Son says urine has been bypassing the catheter since last 2 weeks. He was started on pyridium for concerns of bladder spasm when seen by Urology last clinic visit. Son says urine  has a strong odor but denied other symptoms like burning, increased frequency and suprapubic pain. Currently taking macrobid once a day at bedtime without any issues.   Review of Systems: ROS Mostly obtained from his son; difficult given hearing difficulty of the patient   Past Medical History:  Diagnosis Date   Arthritis    shoulders and back   Ascending aorta dilatation (HCC)    Cancer (HCC)    skin cancers   Chronic diastolic (congestive) heart failure (HCC)    Chronic low back pain 12/15/2017   CKD (chronic kidney disease) stage 3, GFR 30-59 ml/min (HCC)    Dysrhythmia     Paroxysmal atrial fibrillation; Tachycardia-bradycardia syndrome   Essential tremor 02/28/2016   GERD  (gastroesophageal reflux disease)    Hernia    History of hiatal hernia    Hyperlipemia    Hypertension    Macular degeneration    Neuromuscular disorder (HCC)    neuropathy   Paroxysmal atrial fibrillation ([place]) 10/28/2013   Presence of permanent cardiac pacemaker 12/04/2018   Pulmonary hypertension ([place])    Stroke ([place]) 12/2020   Tachycardia-bradycardia syndrome ([place]) 09/13/2014   TIA (transient ischemic attack)    Past Surgical History:  Procedure Laterality Date   [place] / REPLACE / REMOVE PACEMAKER  12/04/2018   IRRIGATION AND DEBRIDEMENT ABSCESS Right 07/24/2020   Procedure: IRRIGATION AND DEBRIDEMENT HEMATOMA;  Surgeon: Coralie Keens, MD;  Location: [facility];  Service: General;  Laterality: Right;   MASS EXCISION Right 07/20/2020   Procedure: EXCISION OF RIGHT CHEST WALL MASS;  Surgeon: Stark Klein, MD;  Location: [facility];  Service: General;  Laterality: Right;   PACEMAKER IMPLANT N/A 12/04/2018   Procedure: PACEMAKER IMPLANT;  Surgeon: Evans Lance, MD;  Location: [facility];  Service: Cardiovascular;  Laterality: N/A;    Social History   Tobacco Use   Smoking status: Former    Types: Cigarettes    Quit date: 12/30/1962    Years since quitting: 59.1   Smokeless tobacco: Never  Vaping Use   Vaping Use: Never used  Substance Use Topics   Alcohol use: No   Drug use: No    Family History  Problem Relation Age of Onset   GI problems Mother    Other Sister        PAIN ISSUES   Hearing loss Sister    Blindness Sister    Heart attack Neg Hx    Stroke Neg Hx     Allergies  Allergen Reactions   Sulfa Antibiotics Other (See Comments)    Weakness, dizziness   Primidone Other (See Comments)    Other reaction(s): increased sedation   Simvastatin Other (See Comments)    Other reaction(s): arthralgias and fatigue, severe    Health Maintenance  Topic Date Due   URINE MICROALBUMIN   Never done   Zoster Vaccines- Shingrix (1 of 2) Never done   TETANUS/TDAP  10/03/2019   COVID-19 Vaccine (4 - Booster for Pfizer series) 01/05/2021   HEMOGLOBIN A1C  07/29/2021   OPHTHALMOLOGY EXAM  05/10/2022   FOOT EXAM  11/07/2022   Pneumonia Vaccine 83+ Years old  Completed   INFLUENZA VACCINE  Completed   HPV VACCINES  Aged Out    Objective: BP (!) 122/58    Pulse 73    Temp (!) 97.4 F (36.3 C) (Temporal)  Wt 203 lb (92.1 kg)    SpO2 95%    BMI 29.55 kg/m    Physical Exam Constitutional:      Appearance: Frail looking elderly male,tremors of lower jaw HENT: hearing difficulty     Head: Normocephalic and atraumatic.      Mouth: Mucous membranes are moist.  Eyes:    Conjunctiva/sclera: Conjunctivae normal.     Pupils: Pupils are equal, round   Cardiovascular:     Rate and Rhythm: Normal rate and regular rhythm.     Heart sounds:   Pulmonary:     Effort: Pulmonary effort is normal.     Breath sounds: Normal breath sounds.   Abdominal:     General: Non distended     Palpations: soft. Indwelling foley's with attached urobag with clear urine  Musculoskeletal:        General: Normal range of motion.   Skin:    General: Skin is warm and dry.     Comments:  Neurological:     General: Rt sided weakness    Mental Status: awake, and follows commands   Lab Results Lab Results  Component Value Date   WBC 9.0 10/12/2021   HGB 9.9 (L) 10/12/2021   HCT 29.4 (L) 10/12/2021   MCV 99.0 10/12/2021   PLT 63 (L) 10/12/2021    Lab Results  Component Value Date   CREATININE 1.21 10/12/2021   BUN 28 (H) 10/12/2021   NA 135 10/12/2021   K 4.0 10/12/2021   CL 105 10/12/2021   CO2 22 10/12/2021    Lab Results  Component Value Date   ALT 58 (H) 10/09/2021   AST 65 (H) 10/09/2021   ALKPHOS 94 10/09/2021   BILITOT 0.8 10/09/2021    Lab Results  Component Value Date   CHOL 188 01/29/2021   HDL 43 01/29/2021   LDLCALC 126 (H) 01/29/2021   TRIG 96 01/29/2021    CHOLHDL 4.4 01/29/2021   No results found for: LABRPR, RPRTITER No results found for: HIV1RNAQUANT, HIV1RNAVL, CD4TABS   Problem List Items Addressed This Visit       Genitourinary   Recurrent UTI - Primary   Benign prostatic hyperplasia with lower urinary tract symptoms     Other   Chronic indwelling Foley catheter   Medication monitoring encounter   Multiple drug resistant organism (MDRO) culture positive   Recurrent UTI/MDR UTI  H/o Transitional cell ca of bladder on remission BPH with LUTS s/p chronic indwelling Foleys Medication Monitoring   Discussed with son to inform about the urinary leakage to his Urologist Continue Macrobid 100mg  PO daily, seems to be working in terms of preventing further episodes of recurrent UTI Discussed that strong odor is not a symptom of UTI Fu with Dr Gale Journey in 3 months and sooner if concerns   I have personally spent 52 minutes involved in face-to-face and non-face-to-face activities for this patient on the day of the visit including review of prior medical records, discussion of plan, coordination of care and counseling of the patient/family   Wilber Oliphant, [facility] 01/31/2022, 9:22 AM

## 2022-02-01 ENCOUNTER — Ambulatory Visit: Payer: Medicare Other | Admitting: Internal Medicine

## 2022-02-01 DIAGNOSIS — N401 Enlarged prostate with lower urinary tract symptoms: Secondary | ICD-10-CM | POA: Diagnosis not present

## 2022-02-01 DIAGNOSIS — I130 Hypertensive heart and chronic kidney disease with heart failure and stage 1 through stage 4 chronic kidney disease, or unspecified chronic kidney disease: Secondary | ICD-10-CM | POA: Diagnosis not present

## 2022-02-01 DIAGNOSIS — N1832 Chronic kidney disease, stage 3b: Secondary | ICD-10-CM | POA: Diagnosis not present

## 2022-02-01 DIAGNOSIS — I480 Paroxysmal atrial fibrillation: Secondary | ICD-10-CM | POA: Diagnosis not present

## 2022-02-01 DIAGNOSIS — I5042 Chronic combined systolic (congestive) and diastolic (congestive) heart failure: Secondary | ICD-10-CM | POA: Diagnosis not present

## 2022-02-01 DIAGNOSIS — Z466 Encounter for fitting and adjustment of urinary device: Secondary | ICD-10-CM | POA: Diagnosis not present

## 2022-02-05 DIAGNOSIS — N1832 Chronic kidney disease, stage 3b: Secondary | ICD-10-CM | POA: Diagnosis not present

## 2022-02-05 DIAGNOSIS — I130 Hypertensive heart and chronic kidney disease with heart failure and stage 1 through stage 4 chronic kidney disease, or unspecified chronic kidney disease: Secondary | ICD-10-CM | POA: Diagnosis not present

## 2022-02-05 DIAGNOSIS — N401 Enlarged prostate with lower urinary tract symptoms: Secondary | ICD-10-CM | POA: Diagnosis not present

## 2022-02-05 DIAGNOSIS — Z466 Encounter for fitting and adjustment of urinary device: Secondary | ICD-10-CM | POA: Diagnosis not present

## 2022-02-05 DIAGNOSIS — I5042 Chronic combined systolic (congestive) and diastolic (congestive) heart failure: Secondary | ICD-10-CM | POA: Diagnosis not present

## 2022-02-05 DIAGNOSIS — I480 Paroxysmal atrial fibrillation: Secondary | ICD-10-CM | POA: Diagnosis not present

## 2022-02-07 DIAGNOSIS — I480 Paroxysmal atrial fibrillation: Secondary | ICD-10-CM | POA: Diagnosis not present

## 2022-02-07 DIAGNOSIS — N1832 Chronic kidney disease, stage 3b: Secondary | ICD-10-CM | POA: Diagnosis not present

## 2022-02-07 DIAGNOSIS — I130 Hypertensive heart and chronic kidney disease with heart failure and stage 1 through stage 4 chronic kidney disease, or unspecified chronic kidney disease: Secondary | ICD-10-CM | POA: Diagnosis not present

## 2022-02-07 DIAGNOSIS — N401 Enlarged prostate with lower urinary tract symptoms: Secondary | ICD-10-CM | POA: Diagnosis not present

## 2022-02-07 DIAGNOSIS — I5042 Chronic combined systolic (congestive) and diastolic (congestive) heart failure: Secondary | ICD-10-CM | POA: Diagnosis not present

## 2022-02-07 DIAGNOSIS — Z466 Encounter for fitting and adjustment of urinary device: Secondary | ICD-10-CM | POA: Diagnosis not present

## 2022-02-11 DIAGNOSIS — N401 Enlarged prostate with lower urinary tract symptoms: Secondary | ICD-10-CM | POA: Diagnosis not present

## 2022-02-11 DIAGNOSIS — I480 Paroxysmal atrial fibrillation: Secondary | ICD-10-CM | POA: Diagnosis not present

## 2022-02-11 DIAGNOSIS — Z466 Encounter for fitting and adjustment of urinary device: Secondary | ICD-10-CM | POA: Diagnosis not present

## 2022-02-11 DIAGNOSIS — N1832 Chronic kidney disease, stage 3b: Secondary | ICD-10-CM | POA: Diagnosis not present

## 2022-02-11 DIAGNOSIS — I5042 Chronic combined systolic (congestive) and diastolic (congestive) heart failure: Secondary | ICD-10-CM | POA: Diagnosis not present

## 2022-02-11 DIAGNOSIS — I130 Hypertensive heart and chronic kidney disease with heart failure and stage 1 through stage 4 chronic kidney disease, or unspecified chronic kidney disease: Secondary | ICD-10-CM | POA: Diagnosis not present

## 2022-02-12 ENCOUNTER — Ambulatory Visit (INDEPENDENT_AMBULATORY_CARE_PROVIDER_SITE_OTHER): Payer: Medicare Other | Admitting: *Deleted

## 2022-02-12 DIAGNOSIS — N401 Enlarged prostate with lower urinary tract symptoms: Secondary | ICD-10-CM | POA: Diagnosis not present

## 2022-02-12 DIAGNOSIS — I480 Paroxysmal atrial fibrillation: Secondary | ICD-10-CM

## 2022-02-12 DIAGNOSIS — I5042 Chronic combined systolic (congestive) and diastolic (congestive) heart failure: Secondary | ICD-10-CM | POA: Diagnosis not present

## 2022-02-12 DIAGNOSIS — Z466 Encounter for fitting and adjustment of urinary device: Secondary | ICD-10-CM | POA: Diagnosis not present

## 2022-02-12 DIAGNOSIS — Z5181 Encounter for therapeutic drug level monitoring: Secondary | ICD-10-CM

## 2022-02-12 DIAGNOSIS — I130 Hypertensive heart and chronic kidney disease with heart failure and stage 1 through stage 4 chronic kidney disease, or unspecified chronic kidney disease: Secondary | ICD-10-CM | POA: Diagnosis not present

## 2022-02-12 DIAGNOSIS — N1832 Chronic kidney disease, stage 3b: Secondary | ICD-10-CM | POA: Diagnosis not present

## 2022-02-12 DIAGNOSIS — R338 Other retention of urine: Secondary | ICD-10-CM | POA: Diagnosis not present

## 2022-02-12 LAB — POCT INR: INR: 2.1 (ref 2.0–3.0)

## 2022-02-12 NOTE — Patient Instructions (Signed)
Description   Spoke with Douglas Walker with [facility] and advised her to continue taking warfarin 1.5 tablets daily. Recheck INR in 2 weeks-recert pending. Coumadin Clinic [phone].

## 2022-02-13 ENCOUNTER — Other Ambulatory Visit: Payer: Self-pay

## 2022-02-13 ENCOUNTER — Ambulatory Visit (INDEPENDENT_AMBULATORY_CARE_PROVIDER_SITE_OTHER): Payer: Medicare Other | Admitting: Podiatry

## 2022-02-13 DIAGNOSIS — L608 Other nail disorders: Secondary | ICD-10-CM

## 2022-02-13 DIAGNOSIS — D696 Thrombocytopenia, unspecified: Secondary | ICD-10-CM

## 2022-02-13 DIAGNOSIS — D689 Coagulation defect, unspecified: Secondary | ICD-10-CM | POA: Diagnosis not present

## 2022-02-13 DIAGNOSIS — M79676 Pain in unspecified toe(s): Secondary | ICD-10-CM | POA: Diagnosis not present

## 2022-02-13 DIAGNOSIS — E1142 Type 2 diabetes mellitus with diabetic polyneuropathy: Secondary | ICD-10-CM | POA: Diagnosis not present

## 2022-02-13 DIAGNOSIS — B351 Tinea unguium: Secondary | ICD-10-CM

## 2022-02-13 NOTE — Progress Notes (Signed)
This patient returns to my office for at risk foot care.  This patient requires this care by a professional since this patient will be at risk due to having peripheral neuropathy, CKD, Coagulation defect and thrombocytopenia.  This patient is unable to cut nails himself since the patient cannot reach his nails.These nails are painful walking and wearing shoes.  This patient presents for at risk foot care today.  He presents to the office with male son.  He is taking coumadin.    General Appearance  Alert, conversant and in no acute stress.  Vascular  Dorsalis pedis and posterior tibial  pulses are not  palpable  bilaterally.  Capillary return is within normal limits  bilaterally. Temperature is within normal limits  Bilaterally. Swelling feet/legs  Neurologic  Senn-Weinstein monofilament wire test within normal limits  bilaterally. Muscle power within normal limits bilaterally.  Nails Thick disfigured discolored nails with subungual debris  from hallux to fifth toes bilaterally. Pincer nails hallux  B/L. No evidence of bacterial infection or drainage bilaterally.  Orthopedic  No limitations of motion  feet .  No crepitus or effusions noted.  No bony pathology or digital deformities noted.  HAV  B/L.  Skin  normotropic skin with no porokeratosis noted bilaterally.  No signs of infections or ulcers noted.     Onychomycosis  Pain in right toes  Pain in left toes  Consent was obtained for treatment procedures.   Mechanical debridement of nails 1-5  bilaterally performed with a nail nipper.  Filed with dremel without incident. Cauterized right hallux at iatrogenic lesion.   Return office visit  10 weeks                   Told patient to return for periodic foot care and evaluation due to potential at risk complications.   [place] Karl DPM  

## 2022-02-20 DIAGNOSIS — E782 Mixed hyperlipidemia: Secondary | ICD-10-CM | POA: Diagnosis not present

## 2022-02-20 DIAGNOSIS — I10 Essential (primary) hypertension: Secondary | ICD-10-CM | POA: Diagnosis not present

## 2022-02-20 DIAGNOSIS — I5042 Chronic combined systolic (congestive) and diastolic (congestive) heart failure: Secondary | ICD-10-CM | POA: Diagnosis not present

## 2022-02-21 ENCOUNTER — Other Ambulatory Visit: Payer: Medicare Other | Admitting: Internal Medicine

## 2022-02-21 ENCOUNTER — Other Ambulatory Visit: Payer: Self-pay

## 2022-02-21 VITALS — BP 104/58 | HR 67 | Temp 98.2°F | Resp 16 | Wt 201.8 lb

## 2022-02-21 DIAGNOSIS — I69391 Dysphagia following cerebral infarction: Secondary | ICD-10-CM | POA: Diagnosis not present

## 2022-02-21 DIAGNOSIS — G8929 Other chronic pain: Secondary | ICD-10-CM

## 2022-02-21 DIAGNOSIS — R2681 Unsteadiness on feet: Secondary | ICD-10-CM | POA: Diagnosis not present

## 2022-02-21 DIAGNOSIS — R338 Other retention of urine: Secondary | ICD-10-CM | POA: Diagnosis not present

## 2022-02-21 DIAGNOSIS — N401 Enlarged prostate with lower urinary tract symptoms: Secondary | ICD-10-CM | POA: Diagnosis not present

## 2022-02-21 DIAGNOSIS — K5901 Slow transit constipation: Secondary | ICD-10-CM | POA: Diagnosis not present

## 2022-02-21 DIAGNOSIS — I69320 Aphasia following cerebral infarction: Secondary | ICD-10-CM

## 2022-02-21 NOTE — Progress Notes (Signed)
[facility] Follow-Up Visit Telephone: [phone]  Fax: [phone]   Date of encounter: 02/21/22 5:49 AM PATIENT NAME: Douglas Walker [address] [place] [address]   [phone] (home)  DOB: 1925/06/07 MRN: [id number] PRIMARY CARE PROVIDER:    Josetta Huddle, MD,  [address] [place] [address] [phone]  REFERRING PROVIDER:   Josetta Huddle, MD [address] [place],  [place] [address] [phone]  RESPONSIBLE PARTY:    Contact Information     Name Relation Home Work [place] Son [phone]  [phone]   Douglas, Walker Daughter   [phone]        I met face to face with patient and family in his home. Palliative Care was asked to follow this patient by consultation request of  Douglas Huddle, MD to address advance care planning and complex medical decision making. This is follow-up visit.                                     ASSESSMENT AND PLAN / RECOMMENDATIONS:   Advance Care Planning/Goals of Care: Goals include to maximize quality of life and symptom management. Patient/health care surrogate gave his/her permission to discuss.Our advance care planning conversation included a discussion about:    CODE STATUS:  DNR, MOST\ Last visit discussed:  As long as humanly possible, Douglas Walker would like to keep him at home.  His daughter-in-law says that they may need him to go to SNF if he'Douglas not ambulatory and hoyer is used (not physically possible in their home).  Douglas Walker has been working with him for PT.  he is fairly independent.  Gets up in am, his son changes his catheter bag, he washes, dresses, feeds himself, his son gives him his meds.  He does his exercises by the kitchen sink and walks up and down the hallway.  Not sob when he walks, but does get tired in his legs.  He likes to be around his grandchildren and 4 legged grands also.  His family had to make a  difficult decision about withdrawing care for a family member in the past.  Douglas Walker, his grandson, had downs' syndrome and they had to take him off a machine.    Symptom Management/Plan: Unsteady gait: PT evaluated and felt he was not in need of ongoing services, cont use of walker for balance at all times Dysphagia and aphasia:  Continue recommendations from speech therapy to look up and at people when speaking which seems to help him get the words out; take small bites followed by sips to prevent choking Chronic pain:  continue Lyrica, Cymbalta, voltaren, tried to add therapy but it was felt he was not in need when assessment done Urinary retention:  getting catheter changes through RN and likes her so they did not want to change this; f/u with Douglas Walker Constipation:  use miralax qod and hold for loose stools.   Palliative care encounter:  MOST-initiated last visit, but still need to complete formally.    Follow up Palliative Care Visit: Palliative care will continue to follow for complex medical decision making, advance care planning, and clarification of goals. Return 6 weeks or prn.  This visit was coded based on medical decision making (MDM)  PPS: 50%  HOSPICE ELIGIBILITY/DIAGNOSIS: TBD  Chief Complaint: Follow-up palliative visit  HISTORY OF PRESENT ILLNESS:  Douglas Walker is a 88 y.o. year old male  with prior embolic stroke with mild right hemiparesis, aphasia, dysphagia, paroxysmal afib, constipation, hypothyroidism, essential tremor,  left knee OA,   Since I saw Douglas Walker last time, he'Douglas been to anticoagulation clinic for INR checks with last INR 2.1 on 2/14 at [facility] [address]  He remains on coumadin due to his primidone interacting with eliquis.  He has also been to podiatry for toenail trimming on 2/15.    He saw ID on 2/2 for recurrent UTI with multi-drug resistant organism and indwelling foley.  Foley is changed every 3 weeks at [facility] (Douglas Walker).  There had been  urinary leakage noted so advised to f/u with urology about that.  Pt was continued on macrobid 153m po daily.  Next f/u is in May with ID.  Update on 2/23:  Pt reports poor sleep.  He has had 5 mattresses per Douglas Landryand none are comfortable.  A long story was shared about his hematoma on his right side from rib fxs that seemed to develop from use of a very firm mattress at one point.  The hematoma required draining x 2.  When coumadin was held at that time, Douglas Landryreports that that is when pt had his stroke.    Pt is doing better with swallowing by taking fluid between bites (had been eating all of his food and drinking at the end).  He'Douglas also eating smaller portions, but does not take snacks.    Bowels are moving but not on a consistent schedule which bothers pt so we discussed qod miralax with holds for loose stools.    He reports enjoying having [place]when asked [place]).  He did become tearful about this.   History obtained from review of EMR, discussion with primary team, and interview with family, facility staff/caregiver and/or Douglas Walker   I reviewed available labs, medications, imaging, studies and related documents from the EMR.  Records reviewed and summarized above.   ROS  General: NAD EYES: denies vision changes ENMT: has dysphagia Cardiovascular: denies chest pain, has DOE but no change Pulmonary: denies cough, has SOB Abdomen: endorses good appetite, has constipation, endorses continence of bowel GU: denies dysuria, has foley for retention of urine MSK: no  increased weakness--unchanged,  no falls reported Skin: denies rashes or wounds but picks at skin and had place bleeding on his face Neurological: reports chronic pain, reports insomnia but sleeps throughout day when watching tv Psych: Endorses positive mood--is very sarcastic, says he'Douglas content actually Heme/lymph/immuno: easily bruises and  bleeds  Physical Exam: Current and past weights: 201.8 lbs and ranges 198-204 over past 6 wks Constitutional: NAD General: frail appearing, WNWD EYES: anicteric sclera, lids intact, no discharge, tearing of right eye, wears glasses, poor eye contact due to visual loss ENMT: hard of hearing, oral mucous membranes moist, dentition intact CV: irreg irreg, 1+ LE edema Pulmonary: LCTA, no increased work of breathing, no cough, room air Abdomen: intake 75% of prior intake, normo-active BS + 4 quadrants, soft and non tender, no ascites GU: deferred MSK: some sarcopenia, moves all extremities, ambulatory with walker Skin: warm and dry, bleeding spot on face after picking Neuro:  has generalized weakness, has  cognitive impairment, has aphasia Psych: non-anxious affect, A and O x 3 but slow to respond  Hem/lymph/immuno: no widespread bruising, but does have on easily bumped areas like forearms, hands,  shins  CURRENT PROBLEM LIST:  Patient Active Problem List   Diagnosis Date Noted   Recurrent UTI 01/31/2022   Chronic indwelling Foley catheter 01/31/2022   Benign prostatic hyperplasia with lower urinary tract symptoms 01/31/2022   Medication monitoring encounter 01/31/2022   Multiple drug resistant organism (MDRO) culture positive 01/31/2022   Bacteremia due to Proteus species 10/11/2021   Sepsis secondary to UTI ([place]) 10/09/2021   AKI (acute kidney injury) ([place]) 07/06/2021   UTI (urinary tract infection) 07/02/2021   Pain in both testicles    Right hemiparesis ([place]) 04/23/2021   Cerebrovascular accident (CVA) due to embolism of left middle cerebral artery ([place]) 04/23/2021   Neuropathy 04/23/2021   Abnormality of gait 04/23/2021   Abnormal results of thyroid function studies 04/04/2021   Acquired iron deficiency anemia due to decreased absorption 04/04/2021   Acquired thrombophilia ([place]) 04/04/2021   Cerebral atherosclerosis 04/04/2021   Basal cell carcinoma of nose 04/04/2021   Benign  hypertensive heart and renal disease, with heart and renal failure ([place]) 04/04/2021   Benign prostatic hyperplasia 04/04/2021   Obesity 04/04/2021   Bradycardia 04/04/2021   Closed fracture of one rib 04/04/2021   Constipation by delayed colonic transit 04/04/2021   Fatigue 04/04/2021   Gastro-esophageal reflux disease without esophagitis 04/04/2021   Hypothyroidism 04/04/2021   Left lower quadrant pain 04/04/2021   Lumbar radiculopathy 04/04/2021   Reactive depression 04/04/2021   Malignant neoplasm of anterior wall of urinary bladder (Walker) 04/04/2021   Mass of chest wall 04/04/2021   Mixed hyperlipidemia 04/04/2021   Nocturia 04/04/2021   Other shoulder lesions, right shoulder 04/04/2021   Pain in limb 04/04/2021   Peripheral edema 04/04/2021   Personal history of malignant neoplasm of bladder 04/04/2021   Pruritus of genitalia 04/04/2021   Tremor 04/04/2021   Unilateral primary osteoarthritis, left knee 04/04/2021   Unspecified mononeuropathy of unspecified lower limb 04/04/2021   Urinary incontinence 04/04/2021   Dysphagia, post-stroke    Chronic diastolic congestive heart failure (HCC)    Chronic pain syndrome    Left middle cerebral artery stroke ([place]) 02/01/2021   Acute CVA (cerebrovascular accident) ([place]) 01/28/2021   Pacemaker 08/30/2020   Hematoma, chest wall 07/24/2020   Exudative age-related macular degeneration of right eye with inactive choroidal neovascularization ([place]) 05/10/2020   Exudative age-related macular degeneration of left eye with inactive choroidal neovascularization ([place]) 05/10/2020   Retinal hemorrhage of right eye 05/10/2020   Exposure keratopathy, bilateral 05/10/2020   Bilateral dry eyes 05/10/2020   Advanced nonexudative age-related macular degeneration of both eyes with subfoveal involvement 05/10/2020   Sick sinus syndrome ([place]) 12/06/2018   Complete heart block (HCC) 12/04/2018   Chronic low back pain 12/15/2017   Hereditary and idiopathic  peripheral neuropathy 07/01/2016   Essential tremor 02/28/2016   BPPV (benign paroxysmal positional vertigo) 06/19/2015   Dizziness 06/18/2015   Bacteria in urine 06/18/2015   CKD (chronic kidney disease) stage 3, GFR 30-59 ml/min: Probable 06/18/2015   Chronic diastolic CHF (congestive heart failure) ([place]) 05/30/2015   Fall    Syncope 05/19/2015   Lower urinary tract infectious disease 05/19/2015   Hypotension 05/19/2015   Dehydration 05/19/2015   Fracture of rib of left side 05/19/2015   Syncope and collapse 05/19/2015   Mobitz type II atrioventricular block 09/13/2014   Essential hypertension 09/13/2014   Chronic anticoagulation 09/13/2014   Tachycardia-bradycardia syndrome ([place]) 09/13/2014   Paroxysmal atrial fibrillation ([place]) 10/28/2013   Thrombocytopenia ([place]) 04/06/2013   PAST MEDICAL HISTORY:  Active Ambulatory  Problems    Diagnosis Date Noted   Thrombocytopenia ([place]) 04/06/2013   Paroxysmal atrial fibrillation ([place]) 10/28/2013   Mobitz type II atrioventricular block 09/13/2014   Essential hypertension 09/13/2014   Chronic anticoagulation 09/13/2014   Tachycardia-bradycardia syndrome ([place]) 09/13/2014   Syncope 05/19/2015   Lower urinary tract infectious disease 05/19/2015   Hypotension 05/19/2015   Dehydration 05/19/2015   Fracture of rib of left side 05/19/2015   Syncope and collapse 05/19/2015   Fall    Chronic diastolic CHF (congestive heart failure) ([place]) 05/30/2015   Dizziness 06/18/2015   Bacteria in urine 06/18/2015   CKD (chronic kidney disease) stage 3, GFR 30-59 ml/min: Probable 06/18/2015   BPPV (benign paroxysmal positional vertigo) 06/19/2015   Essential tremor 02/28/2016   Hereditary and idiopathic peripheral neuropathy 07/01/2016   Chronic low back pain 12/15/2017   Complete heart block (HCC) 12/04/2018   Sick sinus syndrome ([place]) 12/06/2018   Exudative age-related macular degeneration of right eye with inactive choroidal neovascularization ([place])  05/10/2020   Exudative age-related macular degeneration of left eye with inactive choroidal neovascularization ([place]) 05/10/2020   Retinal hemorrhage of right eye 05/10/2020   Exposure keratopathy, bilateral 05/10/2020   Bilateral dry eyes 05/10/2020   Advanced nonexudative age-related macular degeneration of both eyes with subfoveal involvement 05/10/2020   Hematoma, chest wall 07/24/2020   Pacemaker 08/30/2020   Acute CVA (cerebrovascular accident) ([place]) 01/28/2021   Left middle cerebral artery stroke ([place]) 02/01/2021   Dysphagia, post-stroke    Chronic diastolic congestive heart failure (HCC)    Chronic pain syndrome    Abnormal results of thyroid function studies 04/04/2021   Acquired iron deficiency anemia due to decreased absorption 04/04/2021   Acquired thrombophilia ([place]) 04/04/2021   Cerebral atherosclerosis 04/04/2021   Basal cell carcinoma of nose 04/04/2021   Benign hypertensive heart and renal disease, with heart and renal failure ([place]) 04/04/2021   Benign prostatic hyperplasia 04/04/2021   Obesity 04/04/2021   Bradycardia 04/04/2021   Closed fracture of one rib 04/04/2021   Constipation by delayed colonic transit 04/04/2021   Fatigue 04/04/2021   Gastro-esophageal reflux disease without esophagitis 04/04/2021   Hypothyroidism 04/04/2021   Left lower quadrant pain 04/04/2021   Lumbar radiculopathy 04/04/2021   Reactive depression 04/04/2021   Malignant neoplasm of anterior wall of urinary bladder ([place]) 04/04/2021   Mass of chest wall 04/04/2021   Mixed hyperlipidemia 04/04/2021   Nocturia 04/04/2021   Other shoulder lesions, right shoulder 04/04/2021   Pain in limb 04/04/2021   Peripheral edema 04/04/2021   Personal history of malignant neoplasm of bladder 04/04/2021   Pruritus of genitalia 04/04/2021   Tremor 04/04/2021   Unilateral primary osteoarthritis, left knee 04/04/2021   Unspecified mononeuropathy of unspecified lower limb 04/04/2021   Urinary  incontinence 04/04/2021   Right hemiparesis ([place]) 04/23/2021   Cerebrovascular accident (CVA) due to embolism of left middle cerebral artery ([place]) 04/23/2021   Neuropathy 04/23/2021   Abnormality of gait 04/23/2021   UTI (urinary tract infection) 07/02/2021   Pain in both testicles    AKI (acute kidney injury) ([place]) 07/06/2021   Sepsis secondary to UTI ([place]) 10/09/2021   Bacteremia due to Proteus species 10/11/2021   Recurrent UTI 01/31/2022   Chronic indwelling Foley catheter 01/31/2022   Benign prostatic hyperplasia with lower urinary tract symptoms 01/31/2022   Medication monitoring encounter 01/31/2022   Multiple drug resistant organism (MDRO) culture positive 01/31/2022   Resolved Ambulatory Problems    Diagnosis Date Noted   Encounter for therapeutic  drug monitoring 02/09/2014   Past Medical History:  Diagnosis Date   Arthritis    Ascending aorta dilatation (HCC)    Cancer (HCC)    Chronic diastolic (congestive) heart failure (HCC)    Dysrhythmia    GERD (gastroesophageal reflux disease)    Hernia    History of hiatal hernia    Hyperlipemia    Hypertension    Macular degeneration    Neuromuscular disorder ([employer])    Presence of permanent cardiac pacemaker 12/04/2018   Pulmonary hypertension ([place])    Stroke ([place]) 12/2020   TIA (transient ischemic attack)    SOCIAL HX:  Social History   Tobacco Use   Smoking status: Former    Types: Cigarettes    Quit date: 12/30/1962    Years since quitting: 59.1   Smokeless tobacco: Never  Substance Use Topics   Alcohol use: No     ALLERGIES:  Allergies  Allergen Reactions   Sulfa Antibiotics Other (See Comments)    Weakness, dizziness   Primidone Other (See Comments)    Other reaction(Douglas): increased sedation   Simvastatin Other (See Comments)    Other reaction(Douglas): arthralgias and fatigue, severe     PERTINENT MEDICATIONS:  Outpatient Encounter Medications as of 02/21/2022  Medication Sig   acetaminophen (TYLENOL) 325  MG tablet Take 2 tablets (650 mg total) by mouth every 4 (four) hours as needed for mild pain (or temp > 37.5 C (99.5 F)).   Ascorbic Acid (VITAMIN C) 1000 MG tablet Take 1,000 mg by mouth daily.   DULoxetine (CYMBALTA) 60 MG capsule Take 1 capsule (60 mg total) by mouth daily.   furosemide (LASIX) 20 MG tablet Take 2 tablets (40 mg total) by mouth daily.   levothyroxine (SYNTHROID) 25 MCG tablet Take 1 tablet (25 mcg total) by mouth daily before breakfast.   loratadine (CLARITIN) 10 MG tablet Take 10 mg by mouth daily.   Multiple Vitamin (MULTIVITAMIN WITH MINERALS) TABS tablet Take 1 tablet by mouth daily.   Multiple Vitamins-Minerals (OCUVITE ADULT 50+) CAPS Take 1 capsule by mouth daily.   nitrofurantoin, macrocrystal-monohydrate, (MACROBID) 100 MG capsule Take 1 capsule (100 mg total) by mouth at bedtime.   OVER THE COUNTER MEDICATION Take 15,000 mg by mouth 2 (two) times daily. PT takes a Cranberry Supplement that treats UTI 1 cap twice a day   phenazopyridine (PYRIDIUM) 200 MG tablet Take 200 mg by mouth See admin instructions. 269m oral at night And 2038mextra twice daily as needed for bladder spasms   polyethylene glycol (MIRALAX / GLYCOLAX) 17 g packet Take 17 g by mouth daily. (Patient taking differently: Take 17 g by mouth daily as needed for mild constipation.)   pregabalin (LYRICA) 25 MG capsule Take 2 capsules (50 mg total) by mouth at bedtime. TAKE 1 CAPSULE(25 MG) BY MOUTH AT BEDTIME   primidone (MYSOLINE) 250 MG tablet Take 1 tablet (250 mg total) by mouth 2 (two) times daily. (Patient taking differently: Take 250 mg by mouth 2 (two) times daily. Taking 1003m  topiramate (TOPAMAX) 50 MG tablet Take 1 tablet (50 mg total) by mouth 2 (two) times daily.   traMADol (ULTRAM) 50 MG tablet Take 50-100 mg by mouth 3 (three) times daily as needed.   vitamin B-12 (CYANOCOBALAMIN) 1000 MCG tablet Take 1 tablet (1,000 mcg total) by mouth every other day. (Patient taking differently: Take  1,000 mcg by mouth See admin instructions. 1000m2mral daily on Sunday, Tuesday, Wednesday, Thursday, repeat.)   warfarin (COUMADIN)  5 MG tablet Take 1 to 1.5 tablets daily as directed by Coumadin clinic (Patient taking differently: Take 7.5-10 mg by mouth See admin instructions. 7.46m on everyday except Friday. Friday take 151m   No facility-administered encounter medications on file as of 02/21/2022.   Thank you for the opportunity to participate in the care of Mr. StBartolomei The palliative care team will continue to follow. Please call our office at [phone] we can be of additional assistance.   TiHollace KinnierDO  COVID-19 PATIENT SCREENING TOOL Asked and negative response unless otherwise noted:  Have you had symptoms of covid, tested positive or been in contact with someone with symptoms/positive test in the past 5-10 days? no

## 2022-02-25 ENCOUNTER — Encounter: Payer: Self-pay | Admitting: Internal Medicine

## 2022-02-26 DIAGNOSIS — N401 Enlarged prostate with lower urinary tract symptoms: Secondary | ICD-10-CM | POA: Diagnosis not present

## 2022-02-26 DIAGNOSIS — I130 Hypertensive heart and chronic kidney disease with heart failure and stage 1 through stage 4 chronic kidney disease, or unspecified chronic kidney disease: Secondary | ICD-10-CM | POA: Diagnosis not present

## 2022-02-26 DIAGNOSIS — Z466 Encounter for fitting and adjustment of urinary device: Secondary | ICD-10-CM | POA: Diagnosis not present

## 2022-02-26 DIAGNOSIS — I480 Paroxysmal atrial fibrillation: Secondary | ICD-10-CM | POA: Diagnosis not present

## 2022-02-26 DIAGNOSIS — I5042 Chronic combined systolic (congestive) and diastolic (congestive) heart failure: Secondary | ICD-10-CM | POA: Diagnosis not present

## 2022-02-26 DIAGNOSIS — N1832 Chronic kidney disease, stage 3b: Secondary | ICD-10-CM | POA: Diagnosis not present

## 2022-02-27 ENCOUNTER — Ambulatory Visit (INDEPENDENT_AMBULATORY_CARE_PROVIDER_SITE_OTHER): Payer: Medicare Other | Admitting: Interventional Cardiology

## 2022-02-27 ENCOUNTER — Telehealth: Payer: Self-pay | Admitting: Adult Health

## 2022-02-27 DIAGNOSIS — Z5181 Encounter for therapeutic drug level monitoring: Secondary | ICD-10-CM

## 2022-02-27 DIAGNOSIS — I480 Paroxysmal atrial fibrillation: Secondary | ICD-10-CM | POA: Diagnosis not present

## 2022-02-27 DIAGNOSIS — N401 Enlarged prostate with lower urinary tract symptoms: Secondary | ICD-10-CM | POA: Diagnosis not present

## 2022-02-27 DIAGNOSIS — N1832 Chronic kidney disease, stage 3b: Secondary | ICD-10-CM | POA: Diagnosis not present

## 2022-02-27 DIAGNOSIS — Z466 Encounter for fitting and adjustment of urinary device: Secondary | ICD-10-CM | POA: Diagnosis not present

## 2022-02-27 DIAGNOSIS — I5042 Chronic combined systolic (congestive) and diastolic (congestive) heart failure: Secondary | ICD-10-CM | POA: Diagnosis not present

## 2022-02-27 DIAGNOSIS — I130 Hypertensive heart and chronic kidney disease with heart failure and stage 1 through stage 4 chronic kidney disease, or unspecified chronic kidney disease: Secondary | ICD-10-CM | POA: Diagnosis not present

## 2022-02-27 LAB — POCT INR: INR: 2.3 (ref 2.0–3.0)

## 2022-02-27 MED ORDER — TOPIRAMATE 50 MG PO TABS: 50.0000 mg | ORAL_TABLET | Freq: Two times a day (BID) | ORAL | 0 refills | Status: DC

## 2022-02-27 NOTE — Patient Instructions (Signed)
Description   ?Spoke with Glenard Haring with [facility] and advised her to continue taking warfarin 1.5 tablets daily. Recheck INR in 2 weeks- by home health. Coumadin Clinic [phone].  ?  ? ? ?

## 2022-02-27 NOTE — Telephone Encounter (Signed)
Pt's son called and is needing a refill request fot the pt's topiramate (TOPAMAX) 50 MG tablet sent in to the [facility] on [address].  ?

## 2022-02-27 NOTE — Telephone Encounter (Signed)
1 refill sent. Pending appt on 05/09/22 with Judson Roch NP.  ?

## 2022-02-28 DIAGNOSIS — Z950 Presence of cardiac pacemaker: Secondary | ICD-10-CM | POA: Diagnosis not present

## 2022-02-28 DIAGNOSIS — Z7901 Long term (current) use of anticoagulants: Secondary | ICD-10-CM | POA: Diagnosis not present

## 2022-02-28 DIAGNOSIS — Z5181 Encounter for therapeutic drug level monitoring: Secondary | ICD-10-CM | POA: Diagnosis not present

## 2022-02-28 DIAGNOSIS — E039 Hypothyroidism, unspecified: Secondary | ICD-10-CM | POA: Diagnosis not present

## 2022-02-28 DIAGNOSIS — G250 Essential tremor: Secondary | ICD-10-CM | POA: Diagnosis not present

## 2022-02-28 DIAGNOSIS — Z9181 History of falling: Secondary | ICD-10-CM | POA: Diagnosis not present

## 2022-02-28 DIAGNOSIS — I495 Sick sinus syndrome: Secondary | ICD-10-CM | POA: Diagnosis not present

## 2022-02-28 DIAGNOSIS — N1832 Chronic kidney disease, stage 3b: Secondary | ICD-10-CM | POA: Diagnosis not present

## 2022-02-28 DIAGNOSIS — I5042 Chronic combined systolic (congestive) and diastolic (congestive) heart failure: Secondary | ICD-10-CM | POA: Diagnosis not present

## 2022-02-28 DIAGNOSIS — Z8673 Personal history of transient ischemic attack (TIA), and cerebral infarction without residual deficits: Secondary | ICD-10-CM | POA: Diagnosis not present

## 2022-02-28 DIAGNOSIS — I130 Hypertensive heart and chronic kidney disease with heart failure and stage 1 through stage 4 chronic kidney disease, or unspecified chronic kidney disease: Secondary | ICD-10-CM | POA: Diagnosis not present

## 2022-02-28 DIAGNOSIS — I480 Paroxysmal atrial fibrillation: Secondary | ICD-10-CM | POA: Diagnosis not present

## 2022-02-28 DIAGNOSIS — D696 Thrombocytopenia, unspecified: Secondary | ICD-10-CM | POA: Diagnosis not present

## 2022-02-28 DIAGNOSIS — Z87440 Personal history of urinary (tract) infections: Secondary | ICD-10-CM | POA: Diagnosis not present

## 2022-02-28 DIAGNOSIS — Z466 Encounter for fitting and adjustment of urinary device: Secondary | ICD-10-CM | POA: Diagnosis not present

## 2022-02-28 DIAGNOSIS — N401 Enlarged prostate with lower urinary tract symptoms: Secondary | ICD-10-CM | POA: Diagnosis not present

## 2022-03-05 DIAGNOSIS — I480 Paroxysmal atrial fibrillation: Secondary | ICD-10-CM | POA: Diagnosis not present

## 2022-03-05 DIAGNOSIS — N401 Enlarged prostate with lower urinary tract symptoms: Secondary | ICD-10-CM | POA: Diagnosis not present

## 2022-03-05 DIAGNOSIS — I130 Hypertensive heart and chronic kidney disease with heart failure and stage 1 through stage 4 chronic kidney disease, or unspecified chronic kidney disease: Secondary | ICD-10-CM | POA: Diagnosis not present

## 2022-03-05 DIAGNOSIS — N1832 Chronic kidney disease, stage 3b: Secondary | ICD-10-CM | POA: Diagnosis not present

## 2022-03-05 DIAGNOSIS — I5042 Chronic combined systolic (congestive) and diastolic (congestive) heart failure: Secondary | ICD-10-CM | POA: Diagnosis not present

## 2022-03-05 DIAGNOSIS — Z466 Encounter for fitting and adjustment of urinary device: Secondary | ICD-10-CM | POA: Diagnosis not present

## 2022-03-13 ENCOUNTER — Ambulatory Visit (INDEPENDENT_AMBULATORY_CARE_PROVIDER_SITE_OTHER): Payer: Medicare Other

## 2022-03-13 DIAGNOSIS — N401 Enlarged prostate with lower urinary tract symptoms: Secondary | ICD-10-CM | POA: Diagnosis not present

## 2022-03-13 DIAGNOSIS — I130 Hypertensive heart and chronic kidney disease with heart failure and stage 1 through stage 4 chronic kidney disease, or unspecified chronic kidney disease: Secondary | ICD-10-CM | POA: Diagnosis not present

## 2022-03-13 DIAGNOSIS — Z466 Encounter for fitting and adjustment of urinary device: Secondary | ICD-10-CM | POA: Diagnosis not present

## 2022-03-13 DIAGNOSIS — Z5181 Encounter for therapeutic drug level monitoring: Secondary | ICD-10-CM

## 2022-03-13 DIAGNOSIS — N1832 Chronic kidney disease, stage 3b: Secondary | ICD-10-CM | POA: Diagnosis not present

## 2022-03-13 DIAGNOSIS — I480 Paroxysmal atrial fibrillation: Secondary | ICD-10-CM | POA: Diagnosis not present

## 2022-03-13 DIAGNOSIS — I5042 Chronic combined systolic (congestive) and diastolic (congestive) heart failure: Secondary | ICD-10-CM | POA: Diagnosis not present

## 2022-03-13 LAB — POCT INR: INR: 1.9 — AB (ref 2.0–3.0)

## 2022-03-13 NOTE — Patient Instructions (Signed)
Description   ?Spoke with Glenard Haring with [facility] and advised to take 2 tablets today and then continue taking warfarin 1.5 tablets daily.  ?Recheck INR in 2 weeks- by home health.  ?Coumadin Clinic [phone].  ?  ?   ?

## 2022-03-26 ENCOUNTER — Ambulatory Visit (INDEPENDENT_AMBULATORY_CARE_PROVIDER_SITE_OTHER): Payer: Medicare Other | Admitting: *Deleted

## 2022-03-26 DIAGNOSIS — I130 Hypertensive heart and chronic kidney disease with heart failure and stage 1 through stage 4 chronic kidney disease, or unspecified chronic kidney disease: Secondary | ICD-10-CM | POA: Diagnosis not present

## 2022-03-26 DIAGNOSIS — N401 Enlarged prostate with lower urinary tract symptoms: Secondary | ICD-10-CM | POA: Diagnosis not present

## 2022-03-26 DIAGNOSIS — I480 Paroxysmal atrial fibrillation: Secondary | ICD-10-CM

## 2022-03-26 DIAGNOSIS — Z5181 Encounter for therapeutic drug level monitoring: Secondary | ICD-10-CM

## 2022-03-26 DIAGNOSIS — Z466 Encounter for fitting and adjustment of urinary device: Secondary | ICD-10-CM | POA: Diagnosis not present

## 2022-03-26 DIAGNOSIS — N1832 Chronic kidney disease, stage 3b: Secondary | ICD-10-CM | POA: Diagnosis not present

## 2022-03-26 DIAGNOSIS — I5042 Chronic combined systolic (congestive) and diastolic (congestive) heart failure: Secondary | ICD-10-CM | POA: Diagnosis not present

## 2022-03-26 DIAGNOSIS — R338 Other retention of urine: Secondary | ICD-10-CM | POA: Diagnosis not present

## 2022-03-26 LAB — POCT INR: INR: 1.9 — AB (ref 2.0–3.0)

## 2022-03-26 NOTE — Patient Instructions (Signed)
Description   ?Spoke with Glenard Haring with [facility] and advised to take 2 tablets today and then start taking warfarin 1.5 tablets daily except 2 tablets on Sundays. Recheck INR in 2 weeks-by home health. Coumadin Clinic [phone].  ?  ?  ?

## 2022-03-30 DIAGNOSIS — N401 Enlarged prostate with lower urinary tract symptoms: Secondary | ICD-10-CM | POA: Diagnosis not present

## 2022-03-30 DIAGNOSIS — I5042 Chronic combined systolic (congestive) and diastolic (congestive) heart failure: Secondary | ICD-10-CM | POA: Diagnosis not present

## 2022-03-30 DIAGNOSIS — E039 Hypothyroidism, unspecified: Secondary | ICD-10-CM | POA: Diagnosis not present

## 2022-03-30 DIAGNOSIS — N1832 Chronic kidney disease, stage 3b: Secondary | ICD-10-CM | POA: Diagnosis not present

## 2022-03-30 DIAGNOSIS — I495 Sick sinus syndrome: Secondary | ICD-10-CM | POA: Diagnosis not present

## 2022-03-30 DIAGNOSIS — Z8673 Personal history of transient ischemic attack (TIA), and cerebral infarction without residual deficits: Secondary | ICD-10-CM | POA: Diagnosis not present

## 2022-03-30 DIAGNOSIS — Z7901 Long term (current) use of anticoagulants: Secondary | ICD-10-CM | POA: Diagnosis not present

## 2022-03-30 DIAGNOSIS — I130 Hypertensive heart and chronic kidney disease with heart failure and stage 1 through stage 4 chronic kidney disease, or unspecified chronic kidney disease: Secondary | ICD-10-CM | POA: Diagnosis not present

## 2022-03-30 DIAGNOSIS — Z9181 History of falling: Secondary | ICD-10-CM | POA: Diagnosis not present

## 2022-03-30 DIAGNOSIS — D696 Thrombocytopenia, unspecified: Secondary | ICD-10-CM | POA: Diagnosis not present

## 2022-03-30 DIAGNOSIS — Z5181 Encounter for therapeutic drug level monitoring: Secondary | ICD-10-CM | POA: Diagnosis not present

## 2022-03-30 DIAGNOSIS — G250 Essential tremor: Secondary | ICD-10-CM | POA: Diagnosis not present

## 2022-03-30 DIAGNOSIS — I480 Paroxysmal atrial fibrillation: Secondary | ICD-10-CM | POA: Diagnosis not present

## 2022-03-30 DIAGNOSIS — Z950 Presence of cardiac pacemaker: Secondary | ICD-10-CM | POA: Diagnosis not present

## 2022-03-30 DIAGNOSIS — Z87440 Personal history of urinary (tract) infections: Secondary | ICD-10-CM | POA: Diagnosis not present

## 2022-03-30 DIAGNOSIS — Z466 Encounter for fitting and adjustment of urinary device: Secondary | ICD-10-CM | POA: Diagnosis not present

## 2022-04-10 ENCOUNTER — Ambulatory Visit (INDEPENDENT_AMBULATORY_CARE_PROVIDER_SITE_OTHER): Payer: Medicare Other

## 2022-04-10 DIAGNOSIS — I5042 Chronic combined systolic (congestive) and diastolic (congestive) heart failure: Secondary | ICD-10-CM | POA: Diagnosis not present

## 2022-04-10 DIAGNOSIS — I130 Hypertensive heart and chronic kidney disease with heart failure and stage 1 through stage 4 chronic kidney disease, or unspecified chronic kidney disease: Secondary | ICD-10-CM | POA: Diagnosis not present

## 2022-04-10 DIAGNOSIS — I480 Paroxysmal atrial fibrillation: Secondary | ICD-10-CM

## 2022-04-10 DIAGNOSIS — N1832 Chronic kidney disease, stage 3b: Secondary | ICD-10-CM | POA: Diagnosis not present

## 2022-04-10 DIAGNOSIS — Z466 Encounter for fitting and adjustment of urinary device: Secondary | ICD-10-CM | POA: Diagnosis not present

## 2022-04-10 DIAGNOSIS — N401 Enlarged prostate with lower urinary tract symptoms: Secondary | ICD-10-CM | POA: Diagnosis not present

## 2022-04-10 LAB — POCT INR: INR: 2 (ref 2.0–3.0)

## 2022-04-10 MED ORDER — WARFARIN SODIUM 5 MG PO TABS
ORAL_TABLET | ORAL | 0 refills | Status: DC
Start: 2022-04-10 — End: 2022-07-08

## 2022-04-10 NOTE — Patient Instructions (Signed)
Description   ?Spoke with Douglas Walker with [facility] and advised to have pt continue on same dosage of Warfarin 1.5 tablets daily except 2 tablets on Sundays. Recheck INR in 2 weeks-by home health. Coumadin Clinic [phone].  ?  ?  ?

## 2022-04-12 DIAGNOSIS — R338 Other retention of urine: Secondary | ICD-10-CM | POA: Diagnosis not present

## 2022-04-16 DIAGNOSIS — M1711 Unilateral primary osteoarthritis, right knee: Secondary | ICD-10-CM | POA: Diagnosis not present

## 2022-04-16 DIAGNOSIS — N1832 Chronic kidney disease, stage 3b: Secondary | ICD-10-CM | POA: Diagnosis not present

## 2022-04-16 DIAGNOSIS — F322 Major depressive disorder, single episode, severe without psychotic features: Secondary | ICD-10-CM | POA: Diagnosis not present

## 2022-04-16 DIAGNOSIS — N401 Enlarged prostate with lower urinary tract symptoms: Secondary | ICD-10-CM | POA: Diagnosis not present

## 2022-04-16 DIAGNOSIS — I480 Paroxysmal atrial fibrillation: Secondary | ICD-10-CM | POA: Diagnosis not present

## 2022-04-16 DIAGNOSIS — D6869 Other thrombophilia: Secondary | ICD-10-CM | POA: Diagnosis not present

## 2022-04-16 DIAGNOSIS — I442 Atrioventricular block, complete: Secondary | ICD-10-CM | POA: Diagnosis not present

## 2022-04-16 DIAGNOSIS — I5042 Chronic combined systolic (congestive) and diastolic (congestive) heart failure: Secondary | ICD-10-CM | POA: Diagnosis not present

## 2022-04-16 DIAGNOSIS — R32 Unspecified urinary incontinence: Secondary | ICD-10-CM | POA: Diagnosis not present

## 2022-04-16 DIAGNOSIS — G629 Polyneuropathy, unspecified: Secondary | ICD-10-CM | POA: Diagnosis not present

## 2022-04-16 DIAGNOSIS — I495 Sick sinus syndrome: Secondary | ICD-10-CM | POA: Diagnosis not present

## 2022-04-18 ENCOUNTER — Ambulatory Visit (INDEPENDENT_AMBULATORY_CARE_PROVIDER_SITE_OTHER): Payer: Medicare Other

## 2022-04-18 DIAGNOSIS — I495 Sick sinus syndrome: Secondary | ICD-10-CM | POA: Diagnosis not present

## 2022-04-18 LAB — CUP PACEART REMOTE DEVICE CHECK
Battery Remaining Longevity: 24 mo
Battery Voltage: 2.92 V
Brady Statistic AP VP Percent: 99.99 %
Brady Statistic AP VS Percent: 0 %
Brady Statistic AS VP Percent: 0 %
Brady Statistic AS VS Percent: 0.01 %
Brady Statistic RA Percent Paced: 100 %
Brady Statistic RV Percent Paced: 99.99 %
Date Time Interrogation Session: 20230420021427
Implantable Lead Implant Date: 20191206
Implantable Lead Implant Date: 20191206
Implantable Lead Location: 753860
Implantable Lead Location: 753860
Implantable Lead Model: 3830
Implantable Lead Model: 5076
Implantable Pulse Generator Implant Date: 20191206
Lead Channel Impedance Value: 304 Ohm
Lead Channel Impedance Value: 323 Ohm
Lead Channel Impedance Value: 380 Ohm
Lead Channel Impedance Value: 418 Ohm
Lead Channel Sensing Intrinsic Amplitude: 2.875 mV
Lead Channel Sensing Intrinsic Amplitude: 4.25 mV
Lead Channel Sensing Intrinsic Amplitude: 5 mV
Lead Channel Sensing Intrinsic Amplitude: 5 mV
Lead Channel Setting Pacing Amplitude: 2 V
Lead Channel Setting Pacing Amplitude: 2.5 V
Lead Channel Setting Pacing Pulse Width: 0.4 ms
Lead Channel Setting Sensing Sensitivity: 1.2 mV

## 2022-04-23 ENCOUNTER — Other Ambulatory Visit: Payer: Self-pay | Admitting: Internal Medicine

## 2022-04-23 DIAGNOSIS — R0781 Pleurodynia: Secondary | ICD-10-CM | POA: Diagnosis not present

## 2022-04-24 ENCOUNTER — Ambulatory Visit (INDEPENDENT_AMBULATORY_CARE_PROVIDER_SITE_OTHER): Payer: Medicare Other | Admitting: Podiatry

## 2022-04-24 ENCOUNTER — Ambulatory Visit (INDEPENDENT_AMBULATORY_CARE_PROVIDER_SITE_OTHER): Payer: Medicare Other

## 2022-04-24 ENCOUNTER — Encounter: Payer: Self-pay | Admitting: Podiatry

## 2022-04-24 DIAGNOSIS — M79676 Pain in unspecified toe(s): Secondary | ICD-10-CM

## 2022-04-24 DIAGNOSIS — Z466 Encounter for fitting and adjustment of urinary device: Secondary | ICD-10-CM | POA: Diagnosis not present

## 2022-04-24 DIAGNOSIS — D696 Thrombocytopenia, unspecified: Secondary | ICD-10-CM

## 2022-04-24 DIAGNOSIS — E1142 Type 2 diabetes mellitus with diabetic polyneuropathy: Secondary | ICD-10-CM

## 2022-04-24 DIAGNOSIS — Z5181 Encounter for therapeutic drug level monitoring: Secondary | ICD-10-CM | POA: Diagnosis not present

## 2022-04-24 DIAGNOSIS — I5042 Chronic combined systolic (congestive) and diastolic (congestive) heart failure: Secondary | ICD-10-CM | POA: Diagnosis not present

## 2022-04-24 DIAGNOSIS — N401 Enlarged prostate with lower urinary tract symptoms: Secondary | ICD-10-CM | POA: Diagnosis not present

## 2022-04-24 DIAGNOSIS — L608 Other nail disorders: Secondary | ICD-10-CM

## 2022-04-24 DIAGNOSIS — I480 Paroxysmal atrial fibrillation: Secondary | ICD-10-CM | POA: Diagnosis not present

## 2022-04-24 DIAGNOSIS — I130 Hypertensive heart and chronic kidney disease with heart failure and stage 1 through stage 4 chronic kidney disease, or unspecified chronic kidney disease: Secondary | ICD-10-CM | POA: Diagnosis not present

## 2022-04-24 DIAGNOSIS — B351 Tinea unguium: Secondary | ICD-10-CM | POA: Diagnosis not present

## 2022-04-24 DIAGNOSIS — D689 Coagulation defect, unspecified: Secondary | ICD-10-CM

## 2022-04-24 DIAGNOSIS — N1832 Chronic kidney disease, stage 3b: Secondary | ICD-10-CM | POA: Diagnosis not present

## 2022-04-24 LAB — POCT INR: INR: 1.7 — AB (ref 2.0–3.0)

## 2022-04-24 NOTE — Patient Instructions (Signed)
Description   ?Spoke with Douglas Walker with [facility] and advised to have pt take 2 tablets today and then continue on same dosage of Warfarin 1.5 tablets daily except 2 tablets on Sundays.  ?Recheck INR in 2 weeks in office. ?Coumadin Clinic [phone].  ?  ?   ?

## 2022-04-24 NOTE — Progress Notes (Signed)
This patient returns to my office for at risk foot care.  This patient requires this care by a professional since this patient will be at risk due to having peripheral neuropathy, CKD, Coagulation defect and thrombocytopenia.  This patient is unable to cut nails himself since the patient cannot reach his nails.These nails are painful walking and wearing shoes.  This patient presents for at risk foot care today.  He presents to the office with male son.  He is taking coumadin.    General Appearance  Alert, conversant and in no acute stress.  Vascular  Dorsalis pedis and posterior tibial  pulses are not  palpable  bilaterally.  Capillary return is within normal limits  bilaterally. Temperature is within normal limits  Bilaterally. Swelling feet/legs  Neurologic  Senn-Weinstein monofilament wire test within normal limits  bilaterally. Muscle power within normal limits bilaterally.  Nails Thick disfigured discolored nails with subungual debris  from hallux to fifth toes bilaterally. Pincer nails hallux  B/L. No evidence of bacterial infection or drainage bilaterally.  Orthopedic  No limitations of motion  feet .  No crepitus or effusions noted.  No bony pathology or digital deformities noted.  HAV  B/L.  Skin  normotropic skin with no porokeratosis noted bilaterally.  No signs of infections or ulcers noted.     Onychomycosis  Pain in right toes  Pain in left toes  Consent was obtained for treatment procedures.   Mechanical debridement of nails 1-5  bilaterally performed with a nail nipper.  Filed with dremel without incident. Cauterized right hallux at iatrogenic lesion.   Return office visit  10 weeks                   Told patient to return for periodic foot care and evaluation due to potential at risk complications.   Elzada Pytel DPM  

## 2022-04-25 DIAGNOSIS — R338 Other retention of urine: Secondary | ICD-10-CM | POA: Diagnosis not present

## 2022-04-26 ENCOUNTER — Ambulatory Visit
Admission: RE | Admit: 2022-04-26 | Discharge: 2022-04-26 | Disposition: A | Discharge status: Home / Self Care | Payer: Medicare Other | Source: Ambulatory Visit | Attending: Internal Medicine | Admitting: Internal Medicine

## 2022-04-26 DIAGNOSIS — R0781 Pleurodynia: Secondary | ICD-10-CM

## 2022-04-29 ENCOUNTER — Encounter: Payer: Self-pay | Admitting: Internal Medicine

## 2022-04-29 ENCOUNTER — Encounter: Payer: Self-pay | Admitting: Interventional Cardiology

## 2022-04-30 ENCOUNTER — Ambulatory Visit (INDEPENDENT_AMBULATORY_CARE_PROVIDER_SITE_OTHER): Payer: Medicare Other | Admitting: Internal Medicine

## 2022-04-30 ENCOUNTER — Other Ambulatory Visit: Payer: Self-pay

## 2022-04-30 VITALS — BP 125/61 | HR 69 | Resp 16 | Ht 69.5 in | Wt 199.0 lb

## 2022-04-30 DIAGNOSIS — Z8551 Personal history of malignant neoplasm of bladder: Secondary | ICD-10-CM | POA: Diagnosis not present

## 2022-04-30 DIAGNOSIS — N400 Enlarged prostate without lower urinary tract symptoms: Secondary | ICD-10-CM

## 2022-04-30 DIAGNOSIS — Z978 Presence of other specified devices: Secondary | ICD-10-CM

## 2022-04-30 DIAGNOSIS — N390 Urinary tract infection, site not specified: Secondary | ICD-10-CM | POA: Diagnosis not present

## 2022-04-30 NOTE — Patient Instructions (Addendum)
You do not have sign of bladder infection ? ?The trauma with repeated foley replacement is likely causing some inflammation around the urethra opening and inside. I suspect it will be a couple more weeks before this gets much better ? ? ? ?To do starting today ? ?-stop neosporin ?-use vaseline as needed for skin care -- if urology ok with lidocaine ointment could use for symptoms control at this time ?-look for sign of infection such as suprapubic pain/flank pain, fever, chill, lethargy/malaise and to call our office immediately  ?-continue macrobid for uti prophylaxis ?-monitor for sign of lung pneumonitis (cough, chest pain, short of breath) ?

## 2022-04-30 NOTE — Progress Notes (Signed)
?  ? ? ? ? ?[facility] ? ?Reason for Consult:MDRO uti  ?Referring Provider: Inda Merlin ? ? ? ?Patient Active Problem List  ? Diagnosis Date Noted  ? Recurrent UTI 01/31/2022  ? Chronic indwelling Foley catheter 01/31/2022  ? Benign prostatic hyperplasia with lower urinary tract symptoms 01/31/2022  ? Medication monitoring encounter 01/31/2022  ? Multiple drug resistant organism (MDRO) culture positive 01/31/2022  ? Bacteremia due to Proteus species 10/11/2021  ? Sepsis secondary to UTI ([place]) 10/09/2021  ? AKI (acute kidney injury) ([place]) 07/06/2021  ? UTI (urinary tract infection) 07/02/2021  ? Pain in both testicles   ? Right hemiparesis ([place]) 04/23/2021  ? Cerebrovascular accident (CVA) due to embolism of left middle cerebral artery ([place]) 04/23/2021  ? Neuropathy 04/23/2021  ? Abnormality of gait 04/23/2021  ? Abnormal results of thyroid function studies 04/04/2021  ? Acquired iron deficiency anemia due to decreased absorption 04/04/2021  ? Acquired thrombophilia ([place]) 04/04/2021  ? Cerebral atherosclerosis 04/04/2021  ? Basal cell carcinoma of nose 04/04/2021  ? Benign hypertensive heart and renal disease, with heart and renal failure ([place]) 04/04/2021  ? Benign prostatic hyperplasia 04/04/2021  ? Obesity 04/04/2021  ? Bradycardia 04/04/2021  ? Closed fracture of one rib 04/04/2021  ? Constipation by delayed colonic transit 04/04/2021  ? Fatigue 04/04/2021  ? Gastro-esophageal reflux disease without esophagitis 04/04/2021  ? Hypothyroidism 04/04/2021  ? Left lower quadrant pain 04/04/2021  ? Lumbar radiculopathy 04/04/2021  ? Reactive depression 04/04/2021  ? Malignant neoplasm of anterior wall of urinary bladder ([place]) 04/04/2021  ? Mass of chest wall 04/04/2021  ? Mixed hyperlipidemia 04/04/2021  ? Nocturia 04/04/2021  ? Other shoulder lesions, right shoulder 04/04/2021  ? Pain in limb 04/04/2021  ? Peripheral edema 04/04/2021  ? Personal history of malignant neoplasm of bladder 04/04/2021   ? Pruritus of genitalia 04/04/2021  ? Tremor 04/04/2021  ? Unilateral primary osteoarthritis, left knee 04/04/2021  ? Unspecified mononeuropathy of unspecified lower limb 04/04/2021  ? Urinary incontinence 04/04/2021  ? Dysphagia, post-stroke   ? Chronic diastolic congestive heart failure ([place])   ? Chronic pain syndrome   ? Left middle cerebral artery stroke ([place]) 02/01/2021  ? Acute CVA (cerebrovascular accident) ([place]) 01/28/2021  ? Pacemaker 08/30/2020  ? Hematoma, chest wall 07/24/2020  ? Exudative age-related macular degeneration of right eye with inactive choroidal neovascularization ([place]) 05/10/2020  ? Exudative age-related macular degeneration of left eye with inactive choroidal neovascularization ([place]) 05/10/2020  ? Retinal hemorrhage of right eye 05/10/2020  ? Exposure keratopathy, bilateral 05/10/2020  ? Bilateral dry eyes 05/10/2020  ? Advanced nonexudative age-related macular degeneration of both eyes with subfoveal involvement 05/10/2020  ? Sick sinus syndrome ([place]) 12/06/2018  ? Complete heart block ([place]) 12/04/2018  ? Chronic low back pain 12/15/2017  ? Hereditary and idiopathic peripheral neuropathy 07/01/2016  ? Essential tremor 02/28/2016  ? BPPV (benign paroxysmal positional vertigo) 06/19/2015  ? Dizziness 06/18/2015  ? Bacteria in urine 06/18/2015  ? CKD (chronic kidney disease) stage 3, GFR 30-59 ml/min: Probable 06/18/2015  ? Chronic diastolic CHF (congestive heart failure) ([place]) 05/30/2015  ? Fall   ? Syncope 05/19/2015  ? Lower urinary tract infectious disease 05/19/2015  ? Hypotension 05/19/2015  ? Dehydration 05/19/2015  ? Fracture of rib of left side 05/19/2015  ? Syncope and collapse 05/19/2015  ? Mobitz type II atrioventricular block 09/13/2014  ? Essential hypertension 09/13/2014  ? Chronic anticoagulation 09/13/2014  ? Tachycardia-bradycardia syndrome ([place]) 09/13/2014  ? Paroxysmal atrial  fibrillation ([place]) 10/28/2013  ? Thrombocytopenia ([place]) 04/06/2013  ? ? ?Cc -- f/u recurrent  uti/mdro  ? ?HPI: Douglas Walker is a 88 y.o. male hx pacemaker, neuropathy, LUTS in setting bph, hx chronic prostatitis, left MCA cva, chf with preserved ef, ckd3, pAfib, hx low-grade transitional cell carcinoma of bladder 2005 (no recurrence on subsequent cystoscopy), referred here to id clinic for hx of MDRO uti ? ?Reviewed outside record: ?He has been seen by [facility] urology who has recently recommended continuous bladder drainage with urinary catheter for bph/luts (at least 3 months prior to 11/01/2021 visit, to be changed every 3 weeks) ?Patient noted to have had 2 uti requiring hospital admission for sepsis. One in 06/2024 with esbl, and one 10/25/21 with proteus. Both given meropenem. He has imaging which showed no GU stone ? ?He appears to have been placed on cephalexin chronic suppression ? ?Previous cx: ?10/27 [facility] urine cx >100k proteus mirabilis (R amp/sulb, ceftriaxone, cefazolin, cipro, cefotetan, nitrofurantoin, bactrim; S ertapenem, tobra, amikacin) ?09/01 [facility] urine cx > 100k pseudomonas (pan sensitive piptazo, meropenem, cipro, ceftaz, aztreonam, gent) ?07/25 [facility] ucx >100k e coli esbl (R cipro; s amikacin, ertapenem, nitrofurantoin, gent, bactrim) ?06/09 [facility] citrobacter murliniae (R ceftriaxone, cefotaxim, cipro; S nitrofurantoin, gent, amikacin, nitrofurantoin) ?11/2020 [facility] esbl ecoli (R cipro; S gent, amikacin, nitrofurantoin, bactrim) ? ? ? ? ?Of note, patient was on macrobid when his first sepsis (06/2021) occurred. Patient was on macrobid for about 3 months ? ? ?He wasn't on any other prophylaxis at that time ? ? ?He wasn't on methanamene before. But he is getting cranberry/vit c ? ? ?----------------- ?04/30/22 id clinic f/u ?He saw my partner Dr West Bali for follow up on 01/31/22, doing well without recurrent uti on suppressive macrobid ?He had peri-foley urinary leakage at that time. He had a HH nurse trying to change the foley three times for 45 minutes  per his son Douglas Walker's report unsuccessfully. He then went to the urologist and had 1 placed in the clinic ? ?He has had slight bloody drainage. He last have it changed again a week ago -- no gross blood. ? ?He has been putting on neosporin. I had told him to stop neosporin via mychart the last 24-48 hours ? ?He can see some bloody stain on the depend though ? ? ?No fever/chill/flank pain, suprapubic pain ? ?He has been complaining of discomfort in the urethral area  but no f/c, malaise, altered mentation, lethargy.  ? ? ? ?Meds:  ?Reviewed ?Coumadin ?Duloxetine ?Furosemide ?Lyrica ?Primidone ?Topiramate ?Valsartan ?flomax ?cephalixin ? ?Review of Systems: ?ROS ?All other ROS negative ? ? ? ? ? ? ?Past Medical History:  ?Diagnosis Date  ? Arthritis   ? shoulders and back  ? Ascending aorta dilatation (HCC)   ? Cancer [facility])   ? skin cancers  ? Chronic diastolic (congestive) heart failure (HCC)   ? Chronic low back pain 12/15/2017  ? CKD (chronic kidney disease) stage 3, GFR 30-59 ml/min (HCC)   ? Dysrhythmia   ?  Paroxysmal atrial fibrillation; Tachycardia-bradycardia syndrome  ? Essential tremor 02/28/2016  ? GERD (gastroesophageal reflux disease)   ? Hernia   ? History of hiatal hernia   ? Hyperlipemia   ? Hypertension   ? Macular degeneration   ? Neuromuscular disorder ([place])   ? neuropathy  ? Paroxysmal atrial fibrillation ([place]) 10/28/2013  ? Presence of permanent cardiac pacemaker 12/04/2018  ? Pulmonary hypertension ([place])   ? Stroke [facility]) 12/2020  ?  Tachycardia-bradycardia syndrome ([place]) 09/13/2014  ? TIA (transient ischemic attack)   ? ? ?Social History  ? ?Tobacco Use  ? Smoking status: Former  ?  Types: Cigarettes  ?  Quit date: 12/30/1962  ?  Years since quitting: 59.3  ? Smokeless tobacco: Never  ?Vaping Use  ? Vaping Use: Never used  ?Substance Use Topics  ? Alcohol use: No  ? Drug use: No  ? ? ?Family History  ?Problem Relation Age of Onset  ? GI problems Mother   ? Other Sister   ?     PAIN ISSUES  ? Hearing  loss Sister   ? Blindness Sister   ? Heart attack Neg Hx   ? Stroke Neg Hx   ? ? ?Allergies  ?Allergen Reactions  ? Sulfa Antibiotics Other (See Comments)  ?  Weakness, dizziness  ? Primidone Other (See

## 2022-05-06 NOTE — Progress Notes (Signed)
Remote pacemaker transmission.   

## 2022-05-08 ENCOUNTER — Ambulatory Visit (INDEPENDENT_AMBULATORY_CARE_PROVIDER_SITE_OTHER): Payer: Medicare Other

## 2022-05-08 DIAGNOSIS — I480 Paroxysmal atrial fibrillation: Secondary | ICD-10-CM | POA: Diagnosis not present

## 2022-05-08 DIAGNOSIS — Z5181 Encounter for therapeutic drug level monitoring: Secondary | ICD-10-CM | POA: Diagnosis not present

## 2022-05-08 LAB — POCT INR: INR: 1.9 — AB (ref 2.0–3.0)

## 2022-05-08 NOTE — Patient Instructions (Signed)
Description   ?START taking Warfarin 1.5 tablets daily except 2 tablets on Sundays and Wednesdays.  ?Recheck INR in 2 weeks in office. ?Coumadin Clinic [phone].  ?  ?   ?

## 2022-05-08 NOTE — Progress Notes (Addendum)
? ? ?Patient: Douglas Walker ?Date of Birth: 1925/08/13 ? ?Reason for Visit: Follow up ?History from: Patient, son  ?Primary Neurologist: Dr. Julius Bowels. Rexene Alberts ? ?ASSESSMENT AND PLAN ?88 y.o. year old male  ? ?1.  History of stroke, left MCA, A-fib on Coumadin ?2.  Essential tremor ?3.  Peripheral neuropathy ?4.  Gait disorder ? ?-Continue follow-up with PCP, will not adjust any medications, he can continue taking primidone and Topamax for tremor; Lyrica for peripheral neuropathy, his son indicates they do not need any refills, follow-up at our office on an as-needed basis ? ?HISTORY OF PRESENT ILLNESS: ?Today 05/09/22 ?Douglas Walker is here today for follow-up.  On primidone and Topamax for essential tremor.  On Lyrica for peripheral neuropathy.  Has history of A-fib, has a pacemaker, on Coumadin.  History of stroke left MCA. Is living with his son, Douglas Walker. Since stroke has some expressive aphasia. Completed [place], gets frustrated when he can't communicate long sentences. Using walker, no recent falls. Tremor in jaw, both hands is involving the left hand more. Blind in the right eye. Left eye vision isn't great. Lower legs burn, along with hands. Increased Lyrica last visit to 50 mg at bedtime, not sure any change. Legs are cool at night. Has catheter with leg bag. No changes in his chronic conditions. ? ?HISTORY  ?10/29/2021 MM: [name] is a 88 year old male with a history of left middle cerebral artery stroke, essential tremor and peripheral neuropathy.  He returns today for follow-up.  He is currently back on Coumadin and tolerating it well.  He feels that his tremors have gotten worse.  He notices the tremor in the right hand and in the jaw.  Currently on primidone 250 mg twice a day and Topamax 50 mg twice a day.  He reports burning sensations in the feet primarily at night.  He is currently taking Lyrica 25 mg twice a day.  He uses a walker when ambulating.  He was recently hospitalized for sepsis.  He  was discharged to rehab at [facility].  He is returning home today from [facility]. ? ?REVIEW OF SYSTEMS: Out of a complete 14 system review of symptoms, the patient complains only of the following symptoms, and all other reviewed systems are negative. ? ?See HPI ? ?ALLERGIES: ?Allergies  ?Allergen Reactions  ? Sulfa Antibiotics Other (See Comments)  ?  Weakness, dizziness  ? Primidone Other (See Comments)  ?  Other reaction(s): increased sedation  ? Simvastatin Other (See Comments)  ?  Other reaction(s): arthralgias and fatigue, severe  ? ? ?HOME MEDICATIONS: ?Outpatient Medications Prior to Visit  ?Medication Sig Dispense Refill  ? acetaminophen (TYLENOL) 325 MG tablet Take 2 tablets (650 mg total) by mouth every 4 (four) hours as needed for mild pain (or temp > 37.5 C (99.5 F)).    ? Ascorbic Acid (VITAMIN C) 1000 MG tablet Take 1,000 mg by mouth daily.    ? DULoxetine (CYMBALTA) 60 MG capsule Take 1 capsule (60 mg total) by mouth daily. 30 capsule 11  ? furosemide (LASIX) 20 MG tablet Take 2 tablets (40 mg total) by mouth daily. 60 tablet 0  ? levothyroxine (SYNTHROID) 25 MCG tablet Take 1 tablet (25 mcg total) by mouth daily before breakfast. 90 tablet 1  ? loratadine (CLARITIN) 10 MG tablet Take 10 mg by mouth daily.    ? Multiple Vitamin (MULTIVITAMIN WITH MINERALS) TABS tablet Take 1 tablet by mouth daily.    ? Multiple Vitamins-Minerals (OCUVITE  ADULT 50+) CAPS Take 1 capsule by mouth daily.    ? nitrofurantoin, macrocrystal-monohydrate, (MACROBID) 100 MG capsule Take 1 capsule (100 mg total) by mouth at bedtime. 90 capsule 1  ? phenazopyridine (PYRIDIUM) 200 MG tablet Take 200 mg by mouth See admin instructions. '200mg'$  oral at night ?And '200mg'$  extra twice daily as needed for bladder spasms    ? polyethylene glycol (MIRALAX / GLYCOLAX) 17 g packet Take 17 g by mouth daily. (Patient taking differently: Take 17 g by mouth daily as needed for mild constipation.) 30 each 0  ? pregabalin (LYRICA) 25 MG  capsule Take 2 capsules (50 mg total) by mouth at bedtime. TAKE 1 CAPSULE(25 MG) BY MOUTH AT BEDTIME 180 capsule 1  ? primidone (MYSOLINE) 250 MG tablet Take 1 tablet (250 mg total) by mouth 2 (two) times daily. (Patient taking differently: Take 250 mg by mouth 2 (two) times daily. Taking '100mg'$ ) 60 tablet 11  ? topiramate (TOPAMAX) 50 MG tablet Take 1 tablet (50 mg total) by mouth 2 (two) times daily. 180 tablet 0  ? traMADol (ULTRAM) 50 MG tablet Take 50-100 mg by mouth 3 (three) times daily as needed.    ? vitamin B-12 (CYANOCOBALAMIN) 1000 MCG tablet Take 1 tablet (1,000 mcg total) by mouth every other day. (Patient taking differently: Take 1,000 mcg by mouth See admin instructions. 101mg oral daily on Sunday, Tuesday, Wednesday, Thursday, repeat.) 30 tablet 0  ? warfarin (COUMADIN) 5 MG tablet Take 1.5 to 2 tablets by mouth once as directed by Coumadin clinic 150 tablet 0  ? OVER THE COUNTER MEDICATION Take 15,000 mg by mouth 2 (two) times daily. PT takes a Cranberry Supplement that treats UTI 1 cap twice a day    ? ?No facility-administered medications prior to visit.  ? ? ?PAST MEDICAL HISTORY: ?Past Medical History:  ?Diagnosis Date  ? Arthritis   ? shoulders and back  ? Ascending aorta dilatation (HCC)   ? Cancer ([facility]   ? skin cancers  ? Chronic diastolic (congestive) heart failure (HCC)   ? Chronic low back pain 12/15/2017  ? CKD (chronic kidney disease) stage 3, GFR 30-59 ml/min (HCC)   ? Dysrhythmia   ?  Paroxysmal atrial fibrillation; Tachycardia-bradycardia syndrome  ? Essential tremor 02/28/2016  ? GERD (gastroesophageal reflux disease)   ? Hernia   ? History of hiatal hernia   ? Hyperlipemia   ? Hypertension   ? Macular degeneration   ? Neuromuscular disorder ([place]   ? neuropathy  ? Paroxysmal atrial fibrillation ([place] 10/28/2013  ? Presence of permanent cardiac pacemaker 12/04/2018  ? Pulmonary hypertension ([place]   ? Stroke ([facility] 12/2020  ? Tachycardia-bradycardia syndrome ([place] 09/13/2014  ? TIA  (transient ischemic attack)   ? ? ?PAST SURGICAL HISTORY: ?Past Surgical History:  ?Procedure Laterality Date  ? APPENDECTOMY    ? EYE SURGERY    ? HERNIA REPAIR    ? HERNIA REPAIR    ? INSERT / REPLACE / REMOVE PACEMAKER  12/04/2018  ? IRRIGATION AND DEBRIDEMENT ABSCESS Right 07/24/2020  ? Procedure: IRRIGATION AND DEBRIDEMENT HEMATOMA;  Surgeon: BCoralie Keens MD;  [place]  Service: General;  Laterality: Right;  ? MASS EXCISION Right 07/20/2020  ? Procedure: EXCISION OF RIGHT CHEST WALL MASS;  Surgeon: BStark Klein MD;  [place] [facility]  Service: General;  Laterality: Right;  ? PACEMAKER IMPLANT N/A 12/04/2018  ? Procedure: PACEMAKER IMPLANT;  Surgeon: TEvans Lance MD;  [facility];  Service:  Cardiovascular;  Laterality: N/A;  ? ? ?FAMILY HISTORY: ?Family History  ?Problem Relation Age of Onset  ? GI problems Mother   ? Other Sister   ?     PAIN ISSUES  ? Hearing loss Sister   ? Blindness Sister   ? Heart attack Neg Hx   ? Stroke Neg Hx   ? ? ?SOCIAL HISTORY: ?Social History  ? ?Socioeconomic History  ? Marital status: Widowed  ?  Spouse name: Not on file  ? Number of children: 1  ? Years of education: 10  ? Highest education level: Not on file  ?Occupational History  ?  Comment: retired  ?Tobacco Use  ? Smoking status: Former  ?  Types: Cigarettes  ?  Quit date: 12/30/1962  ?  Years since quitting: 59.3  ? Smokeless tobacco: Never  ?Vaping Use  ? Vaping Use: Never used  ?Substance and Sexual Activity  ? Alcohol use: No  ? Drug use: No  ? Sexual activity: Not Currently  ?Other Topics Concern  ? Not on file  ?Social History Narrative  ? Lives alone  ? Right-handed  ? Caffeine use- caffeine free coffee, occas soda  ? ?Social Determinants of Health  ? ?Financial Resource Strain: Not on file  ?Food Insecurity: Not on file  ?Transportation Needs: Not on file  ?Physical Activity: Not on file  ?Stress: Not on file  ?Social Connections: Not on file  ?Intimate Partner Violence: Not on file   ? ? ?PHYSICAL EXAM ? ?Vitals:  ? 05/09/22 1045  ?BP: 137/66  ?Pulse: 71  ?Weight: 200 lb (90.7 kg)  ?Height: '5\' 9"'$  (1.753 m)  ? ?Body mass index is 29.53 kg/m?. ? ?Generalized: Well developed, in no acute dist

## 2022-05-09 ENCOUNTER — Encounter: Payer: Self-pay | Admitting: Neurology

## 2022-05-09 ENCOUNTER — Ambulatory Visit (INDEPENDENT_AMBULATORY_CARE_PROVIDER_SITE_OTHER): Payer: Medicare Other | Admitting: Neurology

## 2022-05-09 VITALS — BP 137/66 | HR 71 | Ht 69.0 in | Wt 200.0 lb

## 2022-05-09 DIAGNOSIS — G629 Polyneuropathy, unspecified: Secondary | ICD-10-CM

## 2022-05-09 DIAGNOSIS — G250 Essential tremor: Secondary | ICD-10-CM

## 2022-05-09 NOTE — Patient Instructions (Signed)
Let's continue current medications ?Continue to see Dr. Inda Merlin for refills going forward ?Return here as needed ?

## 2022-05-10 DIAGNOSIS — L720 Epidermal cyst: Secondary | ICD-10-CM | POA: Diagnosis not present

## 2022-05-10 DIAGNOSIS — L858 Other specified epidermal thickening: Secondary | ICD-10-CM | POA: Diagnosis not present

## 2022-05-10 DIAGNOSIS — H61001 Unspecified perichondritis of right external ear: Secondary | ICD-10-CM | POA: Diagnosis not present

## 2022-05-10 DIAGNOSIS — Z85828 Personal history of other malignant neoplasm of skin: Secondary | ICD-10-CM | POA: Diagnosis not present

## 2022-05-10 DIAGNOSIS — D485 Neoplasm of uncertain behavior of skin: Secondary | ICD-10-CM | POA: Diagnosis not present

## 2022-05-10 DIAGNOSIS — L821 Other seborrheic keratosis: Secondary | ICD-10-CM | POA: Diagnosis not present

## 2022-05-16 ENCOUNTER — Encounter (INDEPENDENT_AMBULATORY_CARE_PROVIDER_SITE_OTHER): Payer: Self-pay | Admitting: Ophthalmology

## 2022-05-16 ENCOUNTER — Ambulatory Visit (INDEPENDENT_AMBULATORY_CARE_PROVIDER_SITE_OTHER): Payer: Medicare Other | Admitting: Ophthalmology

## 2022-05-16 DIAGNOSIS — H353212 Exudative age-related macular degeneration, right eye, with inactive choroidal neovascularization: Secondary | ICD-10-CM

## 2022-05-16 DIAGNOSIS — H353134 Nonexudative age-related macular degeneration, bilateral, advanced atrophic with subfoveal involvement: Secondary | ICD-10-CM | POA: Diagnosis not present

## 2022-05-16 DIAGNOSIS — H353222 Exudative age-related macular degeneration, left eye, with inactive choroidal neovascularization: Secondary | ICD-10-CM

## 2022-05-16 DIAGNOSIS — Z947 Corneal transplant status: Secondary | ICD-10-CM | POA: Insufficient documentation

## 2022-05-16 NOTE — Assessment & Plan Note (Signed)
OS central disciform scar stable no active disease

## 2022-05-16 NOTE — Progress Notes (Signed)
05/16/2022     CHIEF COMPLAINT Patient presents for  Chief Complaint  Patient presents with   Macular Degeneration      HISTORY OF PRESENT ILLNESS: Douglas Walker is a 88 y.o. male who presents to the clinic today for:   HPI   1 yr fu OU OCT FP. "He says he can't see out of his right eye. We took him to his neurologist last week. She tried and couldn't get much of a pupil reaction out of the right eye. He was at the neurologist for his tremors. Near the end of 2022 he had a stroke, they said he is fairly recovered from that. He has trouble with his speech. This is the only eye doctor he has." Last edited by Laurin Coder on 05/16/2022  9:33 AM.      Referring physician: Josetta Huddle, MD [address] [place],  [place] [address]  HISTORICAL INFORMATION:   Selected notes from the MEDICAL RECORD NUMBER    Lab Results  Component Value Date   HGBA1C 5.3 01/29/2021     CURRENT MEDICATIONS: No current outpatient medications on file. (Ophthalmic Drugs)   No current facility-administered medications for this visit. (Ophthalmic Drugs)   Current Outpatient Medications (Other)  Medication Sig   acetaminophen (TYLENOL) 325 MG tablet Take 2 tablets (650 mg total) by mouth every 4 (four) hours as needed for mild pain (or temp > 37.5 C (99.5 F)).   Ascorbic Acid (VITAMIN C) 1000 MG tablet Take 1,000 mg by mouth daily.   DULoxetine (CYMBALTA) 60 MG capsule Take 1 capsule (60 mg total) by mouth daily.   furosemide (LASIX) 20 MG tablet Take 2 tablets (40 mg total) by mouth daily.   levothyroxine (SYNTHROID) 25 MCG tablet Take 1 tablet (25 mcg total) by mouth daily before breakfast.   loratadine (CLARITIN) 10 MG tablet Take 10 mg by mouth daily.   Multiple Vitamin (MULTIVITAMIN WITH MINERALS) TABS tablet Take 1 tablet by mouth daily.   Multiple Vitamins-Minerals (OCUVITE ADULT 50+) CAPS Take 1 capsule by mouth daily.   nitrofurantoin, macrocrystal-monohydrate,  (MACROBID) 100 MG capsule Take 1 capsule (100 mg total) by mouth at bedtime.   phenazopyridine (PYRIDIUM) 200 MG tablet Take 200 mg by mouth See admin instructions. '200mg'$  oral at night And '200mg'$  extra twice daily as needed for bladder spasms   polyethylene glycol (MIRALAX / GLYCOLAX) 17 g packet Take 17 g by mouth daily. (Patient taking differently: Take 17 g by mouth daily as needed for mild constipation.)   pregabalin (LYRICA) 25 MG capsule Take 2 capsules (50 mg total) by mouth at bedtime. TAKE 1 CAPSULE(25 MG) BY MOUTH AT BEDTIME   primidone (MYSOLINE) 250 MG tablet Take 1 tablet (250 mg total) by mouth 2 (two) times daily. (Patient taking differently: Take 250 mg by mouth 2 (two) times daily. Taking '100mg'$ )   topiramate (TOPAMAX) 50 MG tablet Take 1 tablet (50 mg total) by mouth 2 (two) times daily.   traMADol (ULTRAM) 50 MG tablet Take 50-100 mg by mouth 3 (three) times daily as needed.   vitamin B-12 (CYANOCOBALAMIN) 1000 MCG tablet Take 1 tablet (1,000 mcg total) by mouth every other day. (Patient taking differently: Take 1,000 mcg by mouth See admin instructions. 1039mg oral daily on Sunday, Tuesday, Wednesday, Thursday, repeat.)   warfarin (COUMADIN) 5 MG tablet Take 1.5 to 2 tablets by mouth once as directed by Coumadin clinic   No current facility-administered medications for this visit. (  Other)      REVIEW OF SYSTEMS: ROS   Negative for: Constitutional, Gastrointestinal, Neurological, Skin, Genitourinary, Musculoskeletal, HENT, Endocrine, Cardiovascular, Eyes, Respiratory, Psychiatric, Allergic/Imm, Heme/Lymph Last edited by Hurman Horn, MD on 05/16/2022 10:33 AM.       ALLERGIES Allergies  Allergen Reactions   Sulfa Antibiotics Other (See Comments)    Weakness, dizziness   Primidone Other (See Comments)    Other reaction(s): increased sedation   Simvastatin Other (See Comments)    Other reaction(s): arthralgias and fatigue, severe    PAST MEDICAL HISTORY Past  Medical History:  Diagnosis Date   Arthritis    shoulders and back   Ascending aorta dilatation (HCC)    Cancer (HCC)    skin cancers   Chronic diastolic (congestive) heart failure (HCC)    Chronic low back pain 12/15/2017   CKD (chronic kidney disease) stage 3, GFR 30-59 ml/min (HCC)    Dysrhythmia     Paroxysmal atrial fibrillation; Tachycardia-bradycardia syndrome   Essential tremor 02/28/2016   GERD (gastroesophageal reflux disease)    Hernia    History of hiatal hernia    Hyperlipemia    Hypertension    Macular degeneration    Neuromuscular disorder (HCC)    neuropathy   Paroxysmal atrial fibrillation ([place]) 10/28/2013   Presence of permanent cardiac pacemaker 12/04/2018   Pulmonary hypertension ([place])    Stroke ([place]) 12/2020   Tachycardia-bradycardia syndrome ([place]) 09/13/2014   TIA (transient ischemic attack)    Past Surgical History:  Procedure Laterality Date   [place] / REPLACE / REMOVE PACEMAKER  12/04/2018   IRRIGATION AND DEBRIDEMENT ABSCESS Right 07/24/2020   Procedure: IRRIGATION AND DEBRIDEMENT HEMATOMA;  Surgeon: Coralie Keens, MD;  Location: [facility];  Service: General;  Laterality: Right;   MASS EXCISION Right 07/20/2020   Procedure: EXCISION OF RIGHT CHEST WALL MASS;  Surgeon: Stark Klein, MD;  Location: [facility];  Service: General;  Laterality: Right;   PACEMAKER IMPLANT N/A 12/04/2018   Procedure: PACEMAKER IMPLANT;  Surgeon: Evans Lance, MD;  Location: [facility];  Service: Cardiovascular;  Laterality: N/A;    FAMILY HISTORY Family History  Problem Relation Age of Onset   GI problems Mother    Other Sister        PAIN ISSUES   Hearing loss Sister    Blindness Sister    Heart attack Neg Hx    Stroke Neg Hx     SOCIAL HISTORY Social History   Tobacco Use   Smoking status: Former    Types: Cigarettes    Quit date: 12/30/1962    Years since quitting: 59.4    Smokeless tobacco: Never  Vaping Use   Vaping Use: Never used  Substance Use Topics   Alcohol use: No   Drug use: No         OPHTHALMIC EXAM:  Base Eye Exam     Visual Acuity (ETDRS)       Right Left   Dist cc CF at 3' 20/400   Dist ph cc NI 20/300    Correction: Glasses         Tonometry (Tonopen, 9:44 AM)       Right Left   Pressure 8 5         Pupils       Pupils Dark Light Shape React APD  Right PERRL 2 1 Slightly Irregular Minimal None   Left PERRL 2 1 Round Minimal None         Visual Fields (Counting fingers)       Left Right   Restrictions Total inferior temporal, superior nasal deficiencies; Partial outer superior temporal, inferior nasal deficiencies Partial outer superior temporal, inferior temporal, superior nasal, inferior nasal deficiencies         Extraocular Movement       Right Left    Full Full         Neuro/Psych     Oriented x3: Yes   Mood/Affect: Normal         Dilation     Both eyes: 1.0% Mydriacyl, 2.5% Phenylephrine @ 9:44 AM           Slit Lamp and Fundus Exam     External Exam       Right Left   External Normal Normal         Slit Lamp Exam       Right Left   Lids/Lashes Normal Normal   Conjunctiva/Sclera White and quiet White and quiet   Cornea Clear, old PKP graft. clear no cells Clear   Anterior Chamber Deep and quiet Deep and quiet   Iris Round and reactive Round and reactive   Lens Posterior chamber intraocular lens, Centered posterior chamber intraocular lens Posterior chamber intraocular lens, Centered posterior chamber intraocular lens   Anterior Vitreous Normal Normal         Fundus Exam       Right Left   Posterior Vitreous Posterior vitreous detachment Posterior vitreous detachment   Disc Normal Normal   C/D Ratio 0.45 0.45   Macula Geographic atrophy, Macular atrophy, Hard drusen, Disciform scar, entire clinicians macula involvement Geographic atrophy, Macular atrophy, Hard  drusen, Disciform scar   Vessels Normal Normal   Periphery Normal Normal            IMAGING AND PROCEDURES  Imaging and Procedures for 05/16/22  OCT, Retina - OU - Both Eyes       Right Eye Quality was borderline. Progression has been stable. Findings include subretinal scarring, central retinal atrophy, outer retinal atrophy.   Left Eye Quality was borderline. Progression has been stable. Findings include subretinal scarring, central retinal atrophy, outer retinal atrophy.   Notes No active maculopathy, will observe cloudy media     Color Fundus Photography Optos - OU - Both Eyes       Right Eye Progression has been stable. Disc findings include normal observations. Macula : retinal pigment epithelium abnormalities, geographic atrophy, drusen. Vessels : normal observations. Periphery : normal observations.   Left Eye Progression has been stable. Disc findings include normal observations. Macula : drusen, geographic atrophy, retinal pigment epithelium abnormalities.   Notes Macular disciform scars, geographic retinal pigment epithelial atrophy in each eye centrally no change in stable             ASSESSMENT/PLAN:  Exudative age-related macular degeneration of right eye with inactive choroidal neovascularization (HCC) No signs of reactivation of CNVM accounts for acuity centrally OS  Exudative age-related macular degeneration of left eye with inactive choroidal neovascularization (HCC) OS central disciform scar stable no active disease  Advanced nonexudative age-related macular degeneration of both eyes with subfoveal involvement Central geographic atrophy OU.  History of corneal transplant OD no signs of graft failure no cellular infiltrates no KPs     ICD-10-CM   1. Exudative age-related macular degeneration  of right eye with inactive choroidal neovascularization ([place])  H35.3212 OCT, Retina - OU - Both Eyes    Color Fundus Photography Optos - OU - Both  Eyes    2. Exudative age-related macular degeneration of left eye with inactive choroidal neovascularization ([place])  H35.3222     3. Advanced nonexudative age-related macular degeneration of both eyes with subfoveal involvement  H35.3134     4. History of corneal transplant  Z94.7       1.  OU with intact peripheral vision  2.  Review low vision aids limited with bilateral massive disciform scars  3.  Ophthalmic Meds Ordered this visit:  No orders of the defined types were placed in this encounter.      Return in about 1 year (around 05/17/2023) for DILATE OU, COLOR FP, OCT.  There are no Patient Instructions on file for this visit.   Explained the diagnoses, plan, and follow up with the patient and they expressed understanding.  Patient expressed understanding of the importance of proper follow up care.   Clent Demark Sayre Witherington M.D. Diseases & Surgery of the Retina and Vitreous [facility] [place] 05/16/22     Abbreviations: M myopia (nearsighted); A astigmatism; H hyperopia (farsighted); P presbyopia; Mrx spectacle prescription;  CTL contact lenses; OD right eye; OS left eye; OU both eyes  XT exotropia; ET esotropia; PEK punctate epithelial keratitis; PEE punctate epithelial erosions; DES dry eye syndrome; MGD meibomian gland dysfunction; ATs artificial tears; PFAT's preservative free artificial tears; [place] nuclear sclerotic cataract; PSC posterior subcapsular cataract; ERM epi-retinal membrane; PVD posterior vitreous detachment; RD retinal detachment; DM diabetes mellitus; DR diabetic retinopathy; NPDR non-proliferative diabetic retinopathy; PDR proliferative diabetic retinopathy; CSME clinically significant macular edema; DME diabetic macular edema; dbh dot blot hemorrhages; CWS cotton wool spot; POAG primary open angle glaucoma; C/D cup-to-disc ratio; HVF humphrey visual field; GVF goldmann visual field; OCT optical coherence tomography; IOP intraocular pressure; BRVO Branch  retinal vein occlusion; CRVO central retinal vein occlusion; CRAO central retinal artery occlusion; BRAO branch retinal artery occlusion; RT retinal tear; SB scleral buckle; PPV pars plana vitrectomy; VH Vitreous hemorrhage; PRP panretinal laser photocoagulation; IVK intravitreal kenalog; VMT vitreomacular traction; MH Macular hole;  NVD neovascularization of the disc; NVE neovascularization elsewhere; AREDS age related eye disease study; ARMD age related macular degeneration; POAG primary open angle glaucoma; EBMD epithelial/anterior basement membrane dystrophy; ACIOL anterior chamber intraocular lens; IOL intraocular lens; PCIOL posterior chamber intraocular lens; Phaco/IOL phacoemulsification with intraocular lens placement; [place] photorefractive keratectomy; LASIK laser assisted in situ keratomileusis; HTN hypertension; DM diabetes mellitus; COPD chronic obstructive pulmonary disease

## 2022-05-16 NOTE — Assessment & Plan Note (Signed)
OD no signs of graft failure no cellular infiltrates no KPs

## 2022-05-16 NOTE — Assessment & Plan Note (Signed)
No signs of reactivation of CNVM accounts for acuity centrally OS

## 2022-05-16 NOTE — Assessment & Plan Note (Signed)
Central geographic atrophy OU.

## 2022-05-17 ENCOUNTER — Other Ambulatory Visit: Payer: Self-pay | Admitting: Physical Medicine and Rehabilitation

## 2022-05-20 DIAGNOSIS — K219 Gastro-esophageal reflux disease without esophagitis: Secondary | ICD-10-CM | POA: Diagnosis not present

## 2022-05-20 DIAGNOSIS — E039 Hypothyroidism, unspecified: Secondary | ICD-10-CM | POA: Diagnosis not present

## 2022-05-20 DIAGNOSIS — I480 Paroxysmal atrial fibrillation: Secondary | ICD-10-CM | POA: Diagnosis not present

## 2022-05-20 DIAGNOSIS — F322 Major depressive disorder, single episode, severe without psychotic features: Secondary | ICD-10-CM | POA: Diagnosis not present

## 2022-05-20 DIAGNOSIS — E782 Mixed hyperlipidemia: Secondary | ICD-10-CM | POA: Diagnosis not present

## 2022-05-20 DIAGNOSIS — I5032 Chronic diastolic (congestive) heart failure: Secondary | ICD-10-CM | POA: Diagnosis not present

## 2022-05-20 DIAGNOSIS — I10 Essential (primary) hypertension: Secondary | ICD-10-CM | POA: Diagnosis not present

## 2022-05-21 DIAGNOSIS — R338 Other retention of urine: Secondary | ICD-10-CM | POA: Diagnosis not present

## 2022-05-22 ENCOUNTER — Ambulatory Visit (INDEPENDENT_AMBULATORY_CARE_PROVIDER_SITE_OTHER): Payer: Medicare Other | Admitting: *Deleted

## 2022-05-22 DIAGNOSIS — I480 Paroxysmal atrial fibrillation: Secondary | ICD-10-CM

## 2022-05-22 LAB — POCT INR: INR: 2.5 (ref 2.0–3.0)

## 2022-05-22 NOTE — Patient Instructions (Signed)
Description   Continue taking Warfarin 1.5 tablets daily except 2 tablets on Sundays and Wednesdays.  Recheck INR in 3 weeks. [facility] [phone].

## 2022-06-11 ENCOUNTER — Ambulatory Visit (INDEPENDENT_AMBULATORY_CARE_PROVIDER_SITE_OTHER): Payer: Medicare Other | Admitting: *Deleted

## 2022-06-11 DIAGNOSIS — Z5181 Encounter for therapeutic drug level monitoring: Secondary | ICD-10-CM | POA: Diagnosis not present

## 2022-06-11 DIAGNOSIS — I480 Paroxysmal atrial fibrillation: Secondary | ICD-10-CM | POA: Diagnosis not present

## 2022-06-11 LAB — POCT INR: INR: 2.2 (ref 2.0–3.0)

## 2022-06-11 NOTE — Patient Instructions (Signed)
Description   Continue taking Warfarin 1.5 tablets daily except 2 tablets on Sundays and Wednesdays.  Recheck INR in 4 weeks. Coumadin Clinic [phone].

## 2022-06-12 DIAGNOSIS — R338 Other retention of urine: Secondary | ICD-10-CM | POA: Diagnosis not present

## 2022-07-07 ENCOUNTER — Other Ambulatory Visit: Payer: Self-pay | Admitting: Interventional Cardiology

## 2022-07-07 DIAGNOSIS — I480 Paroxysmal atrial fibrillation: Secondary | ICD-10-CM

## 2022-07-09 ENCOUNTER — Ambulatory Visit (INDEPENDENT_AMBULATORY_CARE_PROVIDER_SITE_OTHER): Payer: Medicare Other | Admitting: *Deleted

## 2022-07-09 DIAGNOSIS — I480 Paroxysmal atrial fibrillation: Secondary | ICD-10-CM | POA: Diagnosis not present

## 2022-07-09 DIAGNOSIS — R338 Other retention of urine: Secondary | ICD-10-CM | POA: Diagnosis not present

## 2022-07-09 LAB — POCT INR: INR: 2.1 (ref 2.0–3.0)

## 2022-07-09 NOTE — Patient Instructions (Signed)
Description   Continue taking Warfarin 1.5 tablets daily except 2 tablets on Sundays and Wednesdays.  Recheck INR in 6 weeks. Coumadin Clinic [phone].

## 2022-07-10 ENCOUNTER — Encounter: Payer: Self-pay | Admitting: Podiatry

## 2022-07-10 ENCOUNTER — Ambulatory Visit (INDEPENDENT_AMBULATORY_CARE_PROVIDER_SITE_OTHER): Payer: Medicare Other | Admitting: Podiatry

## 2022-07-10 DIAGNOSIS — M79676 Pain in unspecified toe(s): Secondary | ICD-10-CM

## 2022-07-10 DIAGNOSIS — E1142 Type 2 diabetes mellitus with diabetic polyneuropathy: Secondary | ICD-10-CM

## 2022-07-10 DIAGNOSIS — B351 Tinea unguium: Secondary | ICD-10-CM

## 2022-07-10 DIAGNOSIS — L608 Other nail disorders: Secondary | ICD-10-CM

## 2022-07-10 DIAGNOSIS — D689 Coagulation defect, unspecified: Secondary | ICD-10-CM | POA: Diagnosis not present

## 2022-07-10 DIAGNOSIS — D696 Thrombocytopenia, unspecified: Secondary | ICD-10-CM

## 2022-07-10 NOTE — Progress Notes (Signed)
This patient returns to my office for at risk foot care.  This patient requires this care by a professional since this patient will be at risk due to having peripheral neuropathy, CKD, Coagulation defect and thrombocytopenia.  This patient is unable to cut nails himself since the patient cannot reach his nails.These nails are painful walking and wearing shoes.  This patient presents for at risk foot care today.  He presents to the office with male son.  He is taking coumadin.    General Appearance  Alert, conversant and in no acute stress.  Vascular  Dorsalis pedis and posterior tibial  pulses are not  palpable  bilaterally.  Capillary return is within normal limits  bilaterally. Temperature is within normal limits  Bilaterally. Swelling feet/legs  Neurologic  Senn-Weinstein monofilament wire test within normal limits  bilaterally. Muscle power within normal limits bilaterally.  Nails Thick disfigured discolored nails with subungual debris  from hallux to fifth toes bilaterally. Pincer nails hallux  B/L. No evidence of bacterial infection or drainage bilaterally.  Orthopedic  No limitations of motion  feet .  No crepitus or effusions noted.  No bony pathology or digital deformities noted.  HAV  B/L.  Skin  normotropic skin with no porokeratosis noted bilaterally.  No signs of infections or ulcers noted.     Onychomycosis  Pain in right toes  Pain in left toes  Consent was obtained for treatment procedures.   Mechanical debridement of nails 1-5  bilaterally performed with a nail nipper.  Filed with dremel without incident. Cauterized right hallux at iatrogenic lesion.   Return office visit  10 weeks                   Told patient to return for periodic foot care and evaluation due to potential at risk complications.   Gardiner Barefoot DPM

## 2022-07-18 ENCOUNTER — Ambulatory Visit (INDEPENDENT_AMBULATORY_CARE_PROVIDER_SITE_OTHER): Payer: Medicare Other

## 2022-07-18 DIAGNOSIS — I495 Sick sinus syndrome: Secondary | ICD-10-CM

## 2022-07-18 LAB — CUP PACEART REMOTE DEVICE CHECK
Battery Remaining Longevity: 21 mo
Battery Voltage: 2.91 V
Brady Statistic AP VP Percent: 100 %
Brady Statistic AP VS Percent: 0 %
Brady Statistic AS VP Percent: 0 %
Brady Statistic AS VS Percent: 0.01 %
Brady Statistic RA Percent Paced: 100 %
Brady Statistic RV Percent Paced: 100 %
Date Time Interrogation Session: 20230720001211
Implantable Lead Implant Date: 20191206
Implantable Lead Implant Date: 20191206
Implantable Lead Location: 753860
Implantable Lead Location: 753860
Implantable Lead Model: 3830
Implantable Lead Model: 5076
Implantable Pulse Generator Implant Date: 20191206
Lead Channel Impedance Value: 285 Ohm
Lead Channel Impedance Value: 304 Ohm
Lead Channel Impedance Value: 361 Ohm
Lead Channel Impedance Value: 399 Ohm
Lead Channel Sensing Intrinsic Amplitude: 2.875 mV
Lead Channel Sensing Intrinsic Amplitude: 4.25 mV
Lead Channel Sensing Intrinsic Amplitude: 5 mV
Lead Channel Sensing Intrinsic Amplitude: 5 mV
Lead Channel Setting Pacing Amplitude: 2 V
Lead Channel Setting Pacing Amplitude: 2.5 V
Lead Channel Setting Pacing Pulse Width: 0.4 ms
Lead Channel Setting Sensing Sensitivity: 1.2 mV

## 2022-07-24 ENCOUNTER — Encounter: Payer: Self-pay | Admitting: Internal Medicine

## 2022-07-24 ENCOUNTER — Ambulatory Visit (INDEPENDENT_AMBULATORY_CARE_PROVIDER_SITE_OTHER): Payer: Medicare Other | Admitting: Internal Medicine

## 2022-07-24 ENCOUNTER — Other Ambulatory Visit: Payer: Self-pay

## 2022-07-24 VITALS — BP 122/64 | HR 69 | Temp 98.1°F | Ht 69.0 in | Wt 199.0 lb

## 2022-07-24 DIAGNOSIS — N390 Urinary tract infection, site not specified: Secondary | ICD-10-CM | POA: Diagnosis not present

## 2022-07-24 DIAGNOSIS — N401 Enlarged prostate with lower urinary tract symptoms: Secondary | ICD-10-CM | POA: Diagnosis not present

## 2022-07-24 NOTE — Patient Instructions (Addendum)
You are doing well  Things to look for while on macrobid prophylaxis 1) chronic cough 2) fever chill with cough 3) increasing short of breath Basically sign of pneumonia  Let us know if this is the case   Continue to maintain good fluid intake  See me in 6 months for routine follow up. Will check blood test at that time

## 2022-07-24 NOTE — Progress Notes (Signed)
[facility] for Infectious Disease  Reason for Consult:MDRO uti  Referring Provider: Inda Merlin    Patient Active Problem List   Diagnosis Date Noted   History of corneal transplant 05/16/2022   Recurrent UTI 01/31/2022   Chronic indwelling Foley catheter 01/31/2022   Benign prostatic hyperplasia with lower urinary tract symptoms 01/31/2022   Medication monitoring encounter 01/31/2022   Multiple drug resistant organism (MDRO) culture positive 01/31/2022   Bacteremia due to Proteus species 10/11/2021   Sepsis secondary to UTI ([place]) 10/09/2021   AKI (acute kidney injury) ([place]) 07/06/2021   UTI (urinary tract infection) 07/02/2021   Pain in both testicles    Right hemiparesis ([place]) 04/23/2021   Cerebrovascular accident (CVA) due to embolism of left middle cerebral artery ([place]) 04/23/2021   Neuropathy 04/23/2021   Abnormality of gait 04/23/2021   Abnormal results of thyroid function studies 04/04/2021   Acquired iron deficiency anemia due to decreased absorption 04/04/2021   Acquired thrombophilia ([place]) 04/04/2021   Cerebral atherosclerosis 04/04/2021   Basal cell carcinoma of nose 04/04/2021   Benign hypertensive heart and renal disease, with heart and renal failure ([place]) 04/04/2021   Benign prostatic hyperplasia 04/04/2021   Obesity 04/04/2021   Bradycardia 04/04/2021   Closed fracture of one rib 04/04/2021   Constipation by delayed colonic transit 04/04/2021   Fatigue 04/04/2021   Gastro-esophageal reflux disease without esophagitis 04/04/2021   Hypothyroidism 04/04/2021   Left lower quadrant pain 04/04/2021   Lumbar radiculopathy 04/04/2021   Reactive depression 04/04/2021   Malignant neoplasm of anterior wall of urinary bladder ([place]) 04/04/2021   Mass of chest wall 04/04/2021   Mixed hyperlipidemia 04/04/2021   Nocturia 04/04/2021   Other shoulder lesions, right shoulder 04/04/2021   Pain in limb 04/04/2021   Peripheral edema 04/04/2021   Personal history  of malignant neoplasm of bladder 04/04/2021   Pruritus of genitalia 04/04/2021   Tremor 04/04/2021   Unilateral primary osteoarthritis, left knee 04/04/2021   Unspecified mononeuropathy of unspecified lower limb 04/04/2021   Urinary incontinence 04/04/2021   Dysphagia, post-stroke    Chronic diastolic congestive heart failure (HCC)    Chronic pain syndrome    Left middle cerebral artery stroke ([place]) 02/01/2021   Acute CVA (cerebrovascular accident) ([place]) 01/28/2021   Pacemaker 08/30/2020   Hematoma, chest wall 07/24/2020   Exudative age-related macular degeneration of right eye with inactive choroidal neovascularization ([place]) 05/10/2020   Exudative age-related macular degeneration of left eye with inactive choroidal neovascularization ([place]) 05/10/2020   Retinal hemorrhage of right eye 05/10/2020   Exposure keratopathy, bilateral 05/10/2020   Bilateral dry eyes 05/10/2020   Advanced nonexudative age-related macular degeneration of both eyes with subfoveal involvement 05/10/2020   Sick sinus syndrome ([place]) 12/06/2018   Complete heart block (HCC) 12/04/2018   Chronic low back pain 12/15/2017   Hereditary and idiopathic peripheral neuropathy 07/01/2016   Essential tremor 02/28/2016   BPPV (benign paroxysmal positional vertigo) 06/19/2015   Dizziness 06/18/2015   Bacteria in urine 06/18/2015   CKD (chronic kidney disease) stage 3, GFR 30-59 ml/min: Probable 06/18/2015   Chronic diastolic CHF (congestive heart failure) ([place]) 05/30/2015   Fall    Syncope 05/19/2015   Lower urinary tract infectious disease 05/19/2015   Hypotension 05/19/2015   Dehydration 05/19/2015   Fracture of rib of left side 05/19/2015   Syncope and collapse 05/19/2015   Mobitz type II atrioventricular block 09/13/2014   Essential hypertension 09/13/2014   Chronic anticoagulation 09/13/2014   Tachycardia-bradycardia  syndrome ([place]) 09/13/2014   Paroxysmal atrial fibrillation ([place]) 10/28/2013   Thrombocytopenia  ([place]) 04/06/2013    Cc -- f/u recurrent uti/mdro   HPI: Douglas Walker is a 88 y.o. male hx pacemaker, neuropathy, LUTS in setting bph, hx chronic prostatitis, left MCA cva, chf with preserved ef, ckd3, pAfib, hx low-grade transitional cell carcinoma of bladder 2005 (no recurrence on subsequent cystoscopy), referred here to id clinic for hx of MDRO uti. Today patient is here for routine follow up  07/24/22 id clinic visit Patient doing well no uti since I last saw him, might be some gap in his macrobid as he "just started" a couple weeks ago Stable legs swelling Some sob with laying flat chronically Foley changed every 3 weeks  No complaint today    ----------- Reviewed outside record: He has been seen by [facility] urology who has recently recommended continuous bladder drainage with urinary catheter for bph/luts (at least 3 months prior to 11/01/2021 visit, to be changed every 3 weeks) Patient noted to have had 2 uti requiring hospital admission for sepsis. One in 06/2024 with esbl, and one 10/25/21 with proteus. Both given meropenem. He has imaging which showed no GU stone  He appears to have been placed on cephalexin chronic suppression  Previous cx: 10/27 [facility] urine cx >100k proteus mirabilis (R amp/sulb, ceftriaxone, cefazolin, cipro, cefotetan, nitrofurantoin, bactrim; S ertapenem, tobra, amikacin) 09/01 [facility] urine cx > 100k pseudomonas (pan sensitive piptazo, meropenem, cipro, ceftaz, aztreonam, gent) 07/25 [facility] ucx >100k e coli esbl (R cipro; s amikacin, ertapenem, nitrofurantoin, gent, bactrim) 06/09 [facility] citrobacter murliniae (R ceftriaxone, cefotaxim, cipro; S nitrofurantoin, gent, amikacin, nitrofurantoin) 11/2020 [facility] esbl ecoli (R cipro; S gent, amikacin, nitrofurantoin, bactrim)     Of note, patient was on macrobid when his first sepsis (06/2021) occurred. Patient was on macrobid for about 3 months   He wasn't on any other  prophylaxis at that time   He wasn't on methanamene before. But he is getting cranberry/vit c   ----------------- 04/30/22 id clinic f/u He saw my partner Dr West Bali for follow up on 01/31/22, doing well without recurrent uti on suppressive macrobid He had peri-foley urinary leakage at that time. He had a HH nurse trying to change the foley three times for 45 minutes per his son [name]'s report unsuccessfully. He then went to the urologist and had 1 placed in the clinic  He has had slight bloody drainage. He last have it changed again a week ago -- no gross blood.  He has been putting on neosporin. I had told him to stop neosporin via mychart the last 24-48 hours  He can see some bloody stain on the depend though   No fever/chill/flank pain, suprapubic pain  He has been complaining of discomfort in the urethral area  but no f/c, malaise, altered mentation, lethargy.     Meds:  Reviewed Coumadin Duloxetine Furosemide Lyrica Primidone Topiramate Valsartan flomax cephalixin  Review of Systems: ROS All other ROS negative       Past Medical History:  Diagnosis Date   Arthritis    shoulders and back   Ascending aorta dilatation (HCC)    Cancer (HCC)    skin cancers   Chronic diastolic (congestive) heart failure (HCC)    Chronic low back pain 12/15/2017   CKD (chronic kidney disease) stage 3, GFR 30-59 ml/min (HCC)    Dysrhythmia     Paroxysmal atrial fibrillation; Tachycardia-bradycardia syndrome   Essential tremor 02/28/2016  GERD (gastroesophageal reflux disease)    Hernia    History of hiatal hernia    Hyperlipemia    Hypertension    Macular degeneration    Neuromuscular disorder (HCC)    neuropathy   Paroxysmal atrial fibrillation ([place]) 10/28/2013   Presence of permanent cardiac pacemaker 12/04/2018   Pulmonary hypertension ([place])    Stroke ([place]) 12/2020   Tachycardia-bradycardia syndrome ([place]) 09/13/2014   TIA (transient ischemic attack)      Social History   Tobacco Use   Smoking status: Former    Types: Cigarettes    Quit date: 12/30/1962    Years since quitting: 59.6   Smokeless tobacco: Never  Vaping Use   Vaping Use: Never used  Substance Use Topics   Alcohol use: No   Drug use: No    Family History  Problem Relation Age of Onset   GI problems Mother    Other Sister        PAIN ISSUES   Hearing loss Sister    Blindness Sister    Heart attack Neg Hx    Stroke Neg Hx     Allergies  Allergen Reactions   Sulfa Antibiotics Other (See Comments)    Weakness, dizziness Other reaction(s): has caused increased sweating in the past   Primidone Other (See Comments)    Other reaction(s): increased sedation Other reaction(s): increased sedation   Simvastatin Other (See Comments)    Other reaction(s): arthralgias and fatigue, severe Other reaction(s): arthralgias and fatigue, severe    OBJECTIVE: Vitals:   07/24/22 1344  Weight: 199 lb (90.3 kg)   Body mass index is 29.39 kg/m.   Physical Exam General/constitutional: no distress, pleasant HEENT: Normocephalic, PER, Conj Clear, EOMI, Oropharynx clear Neck supple CV: rrr no mrg Lungs: clear to auscultation, normal respiratory effort Abd: Soft, Nontender Ext: bilateral trace edema to knees Skin: echymosis left upper ext Neuro: nonfocal; resting tremor; hard at hearing, wears hearing aides MSK: no peripheral joint swelling/tenderness/warmth; back spines nontender  Foley in place   Lab: Lab Results  Component Value Date   WBC 9.0 10/12/2021   HGB 9.9 (L) 10/12/2021   HCT 29.4 (L) 10/12/2021   MCV 99.0 10/12/2021   PLT 63 (L) 81/12/7508   Last metabolic panel Lab Results  Component Value Date   GLUCOSE 111 (H) 10/12/2021   NA 135 10/12/2021   K 4.0 10/12/2021   CL 105 10/12/2021   CO2 22 10/12/2021   BUN 28 (H) 10/12/2021   CREATININE 1.21 10/12/2021   GFRNONAA 55 (L) 10/12/2021   CALCIUM 8.2 (L) 10/12/2021   PHOS 3.8 02/01/2021    PROT 5.7 (L) 10/09/2021   ALBUMIN 3.2 (L) 10/09/2021   BILITOT 0.8 10/09/2021   ALKPHOS 94 10/09/2021   AST 65 (H) 10/09/2021   ALT 58 (H) 10/09/2021   ANIONGAP 8 10/12/2021    Microbiology:  Serology:  Imaging:   Assessment/plan: Problem List Items Addressed This Visit   None   Recurrent UTI/MDR UTI  H/o Transitional cell ca of bladder on remission BPH with LUTS s/p chronic indwelling Foleys  As of 11/01/2021 per patient's son, he has had several's uti at least 103 a year  Discussed assymptomatic bacteriuria and risk of this with his bph, foley He does seem to have admissions related to uti/sepsis 06/2021 and 09/2021.  Discuss things to avoid looking at in terms of dx for uti such as odor  Advise patient and his son to call our clinic if concern for uti  Reviewed micros form the past year; majority is sensitive to macrobid. Reviewed current evidence of various gfr which doesn't affect macrobid if >30 mL/min  Discuss with patient and his son also the failure is determined long term (over 6 months to a year) in terms of seeing reduction in uti frequency. I do not think he has failed macrobid  All his previous bacteria are resistant to cephalexin   ------------ 04/30/22 Doing well without clinical evidence of uti His son had discussed spc before but at this time not ready Likely having urethral/penile meatus irritation via trauma with recent repeated foley placement  -stop neosporin -use vaseline as needed for skin care -- if urology ok with lidocaine ointment could use for symptoms control at this time -look for sign of infection such as suprapubic pain/flank pain, fever, chill, lethargy/malaise and to call our office immediately  -continue macrobid for uti prophy -monitor for sign of lung pneumonitis  07/24/22 id assessment Doing well No macrobid side effect Chronic foley with q3wk change  -return to clinic 6 months -s/s of macrobid pneumotoxicity  discussed -patient without question or concern  Follow-up: Return in about 6 months (around 01/24/2023).  Jabier Mutton, [place] [facility] [phone] pager   [phone] cell 07/24/2022, 1:58 PM

## 2022-07-30 DIAGNOSIS — R338 Other retention of urine: Secondary | ICD-10-CM | POA: Diagnosis not present

## 2022-08-02 ENCOUNTER — Ambulatory Visit (INDEPENDENT_AMBULATORY_CARE_PROVIDER_SITE_OTHER): Payer: Medicare Other | Admitting: Internal Medicine

## 2022-08-02 ENCOUNTER — Encounter: Payer: Self-pay | Admitting: Internal Medicine

## 2022-08-02 VITALS — BP 124/60 | HR 69 | Ht 69.0 in | Wt 197.0 lb

## 2022-08-02 DIAGNOSIS — I480 Paroxysmal atrial fibrillation: Secondary | ICD-10-CM | POA: Diagnosis not present

## 2022-08-02 DIAGNOSIS — I10 Essential (primary) hypertension: Secondary | ICD-10-CM | POA: Diagnosis not present

## 2022-08-02 DIAGNOSIS — I442 Atrioventricular block, complete: Secondary | ICD-10-CM | POA: Diagnosis not present

## 2022-08-02 LAB — CUP PACEART INCLINIC DEVICE CHECK
Battery Remaining Longevity: 21 mo
Battery Voltage: 2.91 V
Brady Statistic AP VP Percent: 99.93 %
Brady Statistic AP VS Percent: 0 %
Brady Statistic AS VP Percent: 0 %
Brady Statistic AS VS Percent: 0.07 %
Brady Statistic RA Percent Paced: 99.99 %
Brady Statistic RV Percent Paced: 99.93 %
Date Time Interrogation Session: 20230804090438
Implantable Lead Implant Date: 20191206
Implantable Lead Implant Date: 20191206
Implantable Lead Location: 753860
Implantable Lead Location: 753860
Implantable Lead Model: 3830
Implantable Lead Model: 5076
Implantable Pulse Generator Implant Date: 20191206
Lead Channel Impedance Value: 285 Ohm
Lead Channel Impedance Value: 304 Ohm
Lead Channel Impedance Value: 361 Ohm
Lead Channel Impedance Value: 399 Ohm
Lead Channel Pacing Threshold Amplitude: 0.75 V
Lead Channel Pacing Threshold Amplitude: 1 V
Lead Channel Pacing Threshold Pulse Width: 0.4 ms
Lead Channel Pacing Threshold Pulse Width: 0.6 ms
Lead Channel Sensing Intrinsic Amplitude: 4.25 mV
Lead Channel Sensing Intrinsic Amplitude: 5 mV
Lead Channel Sensing Intrinsic Amplitude: 5 mV
Lead Channel Sensing Intrinsic Amplitude: 5.5 mV
Lead Channel Setting Pacing Amplitude: 2 V
Lead Channel Setting Pacing Amplitude: 2.5 V
Lead Channel Setting Pacing Pulse Width: 0.4 ms
Lead Channel Setting Sensing Sensitivity: 1.2 mV

## 2022-08-02 NOTE — Patient Instructions (Signed)
Medication Instructions:  Your physician recommends that you continue on your current medications as directed. Please refer to the Current Medication list given to you today.  *If you need a refill on your cardiac medications before your next appointment, please call your pharmacy*  Lab Work: None ordered.  If you have labs (blood work) drawn today and your tests are completely normal, you will receive your results only by: [place] (if you have MyChart) OR A paper copy in the mail If you have any lab test that is abnormal or we need to change your treatment, we will call you to review the results.  Testing/Procedures: None ordered.  Follow-Up: At [facility], you and your health needs are our priority.  As part of our continuing mission to provide you with exceptional heart care, we have created designated Provider Care Teams.  These Care Teams include your primary Cardiologist (physician) and Advanced Practice Providers (APPs -  Physician Assistants and Nurse Practitioners) who all work together to provide you with the care you need, when you need it.  We recommend signing up for the patient portal called "MyChart".  Sign up information is provided on this After Visit Summary.  MyChart is used to connect with patients for Virtual Visits (Telemedicine).  Patients are able to view lab/test results, encounter notes, upcoming appointments, etc.  Non-urgent messages can be sent to your provider as well.   To learn more about what you can do with MyChart, go to [email or web address].    Your next appointment:   1 year(s)  The format for your next appointment:   In Person  Provider:   [name], MD{or one of the following Advanced Practice Providers on your designated Care Team:   Tommye Standard, [place] Legrand Como "Jonni Sanger" Chalmers Cater, [place]  Remote monitoring is used to monitor your Pacemaker from home. This monitoring reduces the number of office visits required to check your device to  one time per year. It allows [place] to keep an eye on the functioning of your device to ensure it is working properly. You are scheduled for a device check from home on 10/17/2022. You may send your transmission at any time that day. If you have a wireless device, the transmission will be sent automatically. After your physician reviews your transmission, you will receive a postcard with your next transmission date.  Important Information About Sugar

## 2022-08-02 NOTE — Progress Notes (Signed)
HPI Douglas Walker returns today for followup. He has a h/o multiple problems including atrial fib, chronic diastolic heart failure, right chest wall mass, s/p removal complicated by hematoma. He has a h/o sinus node dysfunction and s/p PPM insertion. He has not had syncope but has fallen and injured his back. He is limited in his activity by severe peripheral neuropathy. His son notes that he more sedentary. He denies chest pain or sob.  Allergies  Allergen Reactions   Sulfa Antibiotics Other (See Comments)    Weakness, dizziness Other reaction(s): has caused increased sweating in the past   Primidone Other (See Comments)    Other reaction(s): increased sedation Other reaction(s): increased sedation   Simvastatin Other (See Comments)    Other reaction(s): arthralgias and fatigue, severe Other reaction(s): arthralgias and fatigue, severe     Current Outpatient Medications  Medication Sig Dispense Refill   acetaminophen (TYLENOL) 325 MG tablet Take 2 tablets (650 mg total) by mouth every 4 (four) hours as needed for mild pain (or temp > 37.5 C (99.5 F)).     Ascorbic Acid (VITAMIN C) 1000 MG tablet Take 1,000 mg by mouth daily.     DULoxetine (CYMBALTA) 60 MG capsule Take 1 capsule (60 mg total) by mouth daily. 30 capsule 11   furosemide (LASIX) 20 MG tablet Take 2 tablets (40 mg total) by mouth daily. 60 tablet 0   levothyroxine (SYNTHROID) 25 MCG tablet Take 1 tablet (25 mcg total) by mouth daily before breakfast. 90 tablet 1   loratadine (CLARITIN) 10 MG tablet Take 10 mg by mouth daily.     Multiple Vitamin (MULTIVITAMIN WITH MINERALS) TABS tablet Take 1 tablet by mouth daily.     Multiple Vitamins-Minerals (OCUVITE ADULT 50+) CAPS Take 1 capsule by mouth daily.     nitrofurantoin, macrocrystal-monohydrate, (MACROBID) 100 MG capsule Take 100 mg by mouth daily.     phenazopyridine (PYRIDIUM) 200 MG tablet Take 200 mg by mouth See admin instructions. '200mg'$  oral at night And '200mg'$   extra twice daily as needed for bladder spasms     polyethylene glycol (MIRALAX / GLYCOLAX) 17 g packet Take 17 g by mouth daily. (Patient taking differently: Take 17 g by mouth daily as needed for mild constipation.) 30 each 0   pregabalin (LYRICA) 25 MG capsule Take 2 capsules (50 mg total) by mouth at bedtime. TAKE 1 CAPSULE(25 MG) BY MOUTH AT BEDTIME 180 capsule 1   primidone (MYSOLINE) 250 MG tablet Take 1 tablet (250 mg total) by mouth 2 (two) times daily. (Patient taking differently: Take 250 mg by mouth 2 (two) times daily. Taking '100mg'$ ) 60 tablet 11   topiramate (TOPAMAX) 50 MG tablet Take 1 tablet (50 mg total) by mouth 2 (two) times daily. 180 tablet 0   traMADol (ULTRAM) 50 MG tablet Take 50-100 mg by mouth 3 (three) times daily as needed.     vitamin B-12 (CYANOCOBALAMIN) 1000 MCG tablet Take 1 tablet (1,000 mcg total) by mouth every other day. (Patient taking differently: Take 1,000 mcg by mouth See admin instructions. 1061mg oral daily on Sunday, Tuesday, Wednesday, Thursday, repeat.) 30 tablet 0   warfarin (COUMADIN) 5 MG tablet TAKE 1 AND 1/2 TO 2 TABLETS BY MOUTH ONCE DAILY OR AS DIRECTED BY COUMADIN CLINIC 100 tablet 0   No current facility-administered medications for this visit.     Past Medical History:  Diagnosis Date   Arthritis    shoulders and back   Ascending aorta  dilatation (HCC)    Cancer (HCC)    skin cancers   Chronic diastolic (congestive) heart failure (HCC)    Chronic low back pain 12/15/2017   CKD (chronic kidney disease) stage 3, GFR 30-59 ml/min (HCC)    Dysrhythmia     Paroxysmal atrial fibrillation; Tachycardia-bradycardia syndrome   Essential tremor 02/28/2016   GERD (gastroesophageal reflux disease)    Hernia    History of hiatal hernia    Hyperlipemia    Hypertension    Macular degeneration    Neuromuscular disorder (HCC)    neuropathy   Paroxysmal atrial fibrillation ([place]) 10/28/2013   Presence of permanent cardiac pacemaker 12/04/2018    Pulmonary hypertension ([place])    Stroke ([place]) 12/2020   Tachycardia-bradycardia syndrome ([place]) 09/13/2014   TIA (transient ischemic attack)     ROS:   All systems reviewed and negative except as noted in the HPI.   Past Surgical History:  Procedure Laterality Date   APPENDECTOMY     EYE SURGERY     HERNIA REPAIR     HERNIA REPAIR     INSERT / REPLACE / REMOVE PACEMAKER  12/04/2018   IRRIGATION AND DEBRIDEMENT ABSCESS Right 07/24/2020   Procedure: IRRIGATION AND DEBRIDEMENT HEMATOMA;  Surgeon: Coralie Keens, MD;  Location: [facility];  Service: General;  Laterality: Right;   MASS EXCISION Right 07/20/2020   Procedure: EXCISION OF RIGHT CHEST WALL MASS;  Surgeon: Stark Klein, MD;  Location: [facility];  Service: General;  Laterality: Right;   PACEMAKER IMPLANT N/A 12/04/2018   Procedure: PACEMAKER IMPLANT;  Surgeon: Evans Lance, MD;  Location: [facility];  Service: Cardiovascular;  Laterality: N/A;     Family History  Problem Relation Age of Onset   GI problems Mother    Other Sister        PAIN ISSUES   Hearing loss Sister    Blindness Sister    Heart attack Neg Hx    Stroke Neg Hx      Social History   Socioeconomic History   Marital status: Widowed    Spouse name: Not on file   Number of children: 1   Years of education: 10   Highest education level: Not on file  Occupational History    Comment: retired  Tobacco Use   Smoking status: Former    Types: Cigarettes    Quit date: 12/30/1962    Years since quitting: 59.6   Smokeless tobacco: Never  Vaping Use   Vaping Use: Never used  Substance and Sexual Activity   Alcohol use: No   Drug use: No   Sexual activity: Not Currently  Other Topics Concern   Not on file  Social History Narrative   Lives alone   Right-handed   Caffeine use- caffeine free coffee, occas soda   Social Determinants of Health   Financial Resource Strain: Not on file  Food Insecurity: Not on file  Transportation Needs: Not  on file  Physical Activity: Not on file  Stress: Not on file  Social Connections: Not on file  Intimate Partner Violence: Not on file     BP 124/60   Pulse 69   Ht '5\' 9"'$  (1.753 m)   Wt 197 lb (89.4 kg)   SpO2 95%   BMI 29.09 kg/m   Physical Exam:  Elderly appearing NAD HEENT: Unremarkable Neck:  No JVD, no thyromegally Lymphatics:  No adenopathy Back:  No CVA tenderness Lungs:  Clear with no wheezes HEART:  Regular  rate rhythm, no murmurs, no rubs, no clicks Abd:  soft, positive bowel sounds, no organomegally, no rebound, no guarding Ext:  2 plus pulses, no edema, no cyanosis, no clubbing, tremor is present, ecchymosis in the right arm. Skin:  No rashes no nodules Neuro:  CN II through XII intact, motor grossly intact   DEVICE  Normal device function.  See PaceArt for details.   Assess/Plan:  1. Atrial fib - his rates are controlled. No change in his meds. He is in sinus today. 2. PPM -his medtronic DDD PM is working normally. 3. Falls - he has had one but no injury. He has been placed back on coumadin after having a stroke. 4. HTN - his SBP is up a bit but with his h/o falls we do not want to aggressively try to lower too much.   Douglas Walker.

## 2022-08-05 ENCOUNTER — Encounter (INDEPENDENT_AMBULATORY_CARE_PROVIDER_SITE_OTHER): Payer: Self-pay | Admitting: Ophthalmology

## 2022-08-05 ENCOUNTER — Ambulatory Visit (INDEPENDENT_AMBULATORY_CARE_PROVIDER_SITE_OTHER): Payer: Medicare Other | Admitting: Ophthalmology

## 2022-08-05 DIAGNOSIS — H4051X2 Glaucoma secondary to other eye disorders, right eye, moderate stage: Secondary | ICD-10-CM | POA: Diagnosis not present

## 2022-08-05 DIAGNOSIS — H353222 Exudative age-related macular degeneration, left eye, with inactive choroidal neovascularization: Secondary | ICD-10-CM

## 2022-08-05 DIAGNOSIS — Z947 Corneal transplant status: Secondary | ICD-10-CM | POA: Diagnosis not present

## 2022-08-05 DIAGNOSIS — H4311 Vitreous hemorrhage, right eye: Secondary | ICD-10-CM | POA: Diagnosis not present

## 2022-08-05 DIAGNOSIS — H353134 Nonexudative age-related macular degeneration, bilateral, advanced atrophic with subfoveal involvement: Secondary | ICD-10-CM | POA: Diagnosis not present

## 2022-08-05 DIAGNOSIS — H04123 Dry eye syndrome of bilateral lacrimal glands: Secondary | ICD-10-CM | POA: Diagnosis not present

## 2022-08-05 DIAGNOSIS — H353212 Exudative age-related macular degeneration, right eye, with inactive choroidal neovascularization: Secondary | ICD-10-CM

## 2022-08-05 DIAGNOSIS — H31301 Unspecified choroidal hemorrhage, right eye: Secondary | ICD-10-CM | POA: Diagnosis not present

## 2022-08-05 MED ORDER — PREDNISOLONE ACETATE 1 % OP SUSP: 1.0000 [drp] | Freq: Two times a day (BID) | OPHTHALMIC | 3 refills | Status: DC

## 2022-08-05 MED ORDER — ATROPINE SULFATE 1 % OP SOLN: 1.0000 [drp] | Freq: Two times a day (BID) | OPHTHALMIC | 12 refills | Status: DC

## 2022-08-05 NOTE — Assessment & Plan Note (Signed)
No sign of graft inflammation

## 2022-08-05 NOTE — Progress Notes (Signed)
08/05/2022     CHIEF COMPLAINT Patient presents for  Chief Complaint  Patient presents with   Macular Degeneration    With new onset profound vision loss right eye accompanied by pain  HISTORY OF PRESENT ILLNESS: Douglas Walker is a 88 y.o. male who presents to the clinic today for:   HPI   11 weeks dilate OU Color FP OCT  Pt states his vision has not been stable Pt states he has lost his vision in his right eye 2 weeks ago Pt denies any new floaters or FOL Pt states he is having a lot of pain in his right eye and it's very irritated Last edited by Morene Rankins, CMA on 08/05/2022  2:19 PM.      Referring physician: Josetta Huddle, MD [address] [place],  [place] [address]  HISTORICAL INFORMATION:   Selected notes from the MEDICAL RECORD NUMBER    Lab Results  Component Value Date   HGBA1C 5.3 01/29/2021     CURRENT MEDICATIONS: Current Outpatient Medications (Ophthalmic Drugs)  Medication Sig   atropine 1 % ophthalmic solution Place 1 drop into the right eye 2 (two) times daily.   prednisoLONE acetate (PRED FORTE) 1 % ophthalmic suspension Place 1 drop into the right eye 2 (two) times daily.   brimonidine (ALPHAGAN) 0.2 % ophthalmic solution Place 1 drop into the right eye every 8 (eight) hours.   dorzolamide-timolol (COSOPT) 22.3-6.8 MG/ML ophthalmic solution Place 1 drop into the right eye 2 (two) times daily.   No current facility-administered medications for this visit. (Ophthalmic Drugs)   Current Outpatient Medications (Other)  Medication Sig   acetaminophen (TYLENOL) 325 MG tablet Take 2 tablets (650 mg total) by mouth every 4 (four) hours as needed for mild pain (or temp > 37.5 C (99.5 F)).   apixaban (ELIQUIS) 5 MG TABS tablet Take 1 tablet (5 mg total) by mouth 2 (two) times daily.   Ascorbic Acid (VITAMIN C) 1000 MG tablet Take 1,000 mg by mouth daily.   DULoxetine (CYMBALTA) 60 MG capsule Take 1 capsule (60 mg total) by mouth  daily.   furosemide (LASIX) 20 MG tablet Take 2 tablets (40 mg total) by mouth daily. (Patient taking differently: Take 60 mg by mouth daily.)   levothyroxine (SYNTHROID) 25 MCG tablet Take 1 tablet (25 mcg total) by mouth daily before breakfast.   loratadine (CLARITIN) 10 MG tablet Take 10 mg by mouth daily.   magnesium hydroxide (MILK OF MAGNESIA) 400 MG/5ML suspension Take 30 mLs by mouth daily as needed for mild constipation.   Multiple Vitamin (MULTIVITAMIN WITH MINERALS) TABS tablet Take 1 tablet by mouth daily.   Multiple Vitamins-Minerals (OCUVITE ADULT 50+) CAPS Take 1 capsule by mouth daily.   nitrofurantoin, macrocrystal-monohydrate, (MACROBID) 100 MG capsule Take 100 mg by mouth daily. Continuous course.   polyethylene glycol (MIRALAX / GLYCOLAX) 17 g packet Take 17 g by mouth daily. (Patient taking differently: Take 17 g by mouth daily as needed for mild constipation.)   pregabalin (LYRICA) 50 MG capsule Take 50 mg by mouth at bedtime.   primidone (MYSOLINE) 250 MG tablet Take 1 tablet (250 mg total) by mouth 2 (two) times daily.   topiramate (TOPAMAX) 50 MG tablet Take 1 tablet (50 mg total) by mouth 2 (two) times daily.   vitamin B-12 (CYANOCOBALAMIN) 1000 MCG tablet Take 1 tablet (1,000 mcg total) by mouth every other day. (Patient taking differently: Take 1,000 mcg by mouth See  admin instructions. 1072mg oral daily on Sunday, Tuesday, Wednesday, Thursday, repeat.)   No current facility-administered medications for this visit. (Other)      REVIEW OF SYSTEMS: ROS   Negative for: Constitutional, Gastrointestinal, Neurological, Skin, Genitourinary, Musculoskeletal, HENT, Endocrine, Cardiovascular, Eyes, Respiratory, Psychiatric, Allergic/Imm, Heme/Lymph Last edited by PMorene Rankins CMA on 08/05/2022  2:19 PM.       ALLERGIES Allergies  Allergen Reactions   Sulfa Antibiotics Other (See Comments)    Weakness Dizziness  Sweats    Mysoline [Primidone] Other (See  Comments)    Sedation    Zocor [Simvastatin] Other (See Comments)    Arthralgias Fatigue    PAST MEDICAL HISTORY Past Medical History:  Diagnosis Date   Arthritis    shoulders and back   Ascending aorta dilatation (HCC)    Cancer ([facility] [place]    skin cancers   Chronic diastolic (congestive) heart failure (HCC)    Chronic low back pain 12/15/2017   CKD (chronic kidney disease) stage 3, GFR 30-59 ml/min (HCC)    Dysrhythmia     Paroxysmal atrial fibrillation; Tachycardia-bradycardia syndrome   Essential tremor 02/28/2016   GERD (gastroesophageal reflux disease)    Hernia    History of hiatal hernia    Hyperlipemia    Hypertension    Macular degeneration    Neuromuscular disorder (HCC)    neuropathy   Paroxysmal atrial fibrillation ([facility] [place] 10/28/2013   Presence of permanent cardiac pacemaker 12/04/2018   [place]    [place] 12[place]   Tachy[place]bradycardia syndrome ([place] 09/13/2014   TIA (transient ischemic attack)    Past Surgical History:  Procedure Laterality Date   AWinside/ REPLACE / REMOVE PACEMAKER  12/04/2018   IRRIGATION AND DEBRIDEMENT ABSCESS Right 07/24/2020   Procedure: IRRIGATION AND DEBRIDEMENT HEMATOMA;  Surgeon: BCoralie Keens MD;  [facility]  Service: General;  Laterality: Right;   MASS EXCISION Right 07/20/2020   Procedure: EXCISION OF RIGHT CHEST WALL MASS;  Surgeon: BStark Klein MD;  [facility]  Service: General;  Laterality: Right;   PACEMAKER IMPLANT N/A 12/04/2018   Procedure: PACEMAKER IMPLANT;  Surgeon: TEvans Lance MD;  [facility];  Service: Cardiovascular;  Laterality: N/A;    FAMILY HISTORY Family History  Problem Relation Age of Onset   GI problems Mother    Other Sister        PAIN ISSUES   Hearing loss Sister    Blindness Sister    Heart attack Neg Hx    Stroke Neg Hx     SOCIAL HISTORY Social History    Tobacco Use   Smoking status: Former    Types: Cigarettes    Quit date: 12/30/1962    Years since quitting: 59.7   Smokeless tobacco: Never  Vaping Use   Vaping Use: Never used  Substance Use Topics   Alcohol use: No   Drug use: No         OPHTHALMIC EXAM:  Base Eye Exam     Visual Acuity (ETDRS)       Right Left   Dist Elliott LP 20/400         Tonometry (Tonopen, 2:24 PM)       Right Left   Pressure 14 14         Tonometry #2 (Palpation, 3:10 PM)  Right Left   Pressure 35          Tonometry Comments   Tender to palpation OD        Neuro/Psych     Oriented x3: Yes   Mood/Affect: Normal         Dilation     Both eyes: 1.0% Mydriacyl, 2.5% Phenylephrine @ 2:21 PM           Slit Lamp and Fundus Exam     External Exam       Right Left   External Normal Normal         Slit Lamp Exam       Right Left   Lids/Lashes Normal Normal   Conjunctiva/Sclera White and quiet White and quiet   Cornea old PKP graft.  Slightly edema but no endothelial cells Clear   Anterior Chamber Deep and quiet Deep and quiet   Iris Fixed Round and reactive   Lens Posterior chamber intraocular lens, Centered posterior chamber intraocular lens Posterior chamber intraocular lens, Centered posterior chamber intraocular lens   Anterior Vitreous Dense hemorrhage against the posterior capsule Normal         Fundus Exam       Right Left   Posterior Vitreous Dense vitreous hemorrhage, no view posteriorly OD Posterior vitreous detachment   Disc  Normal   C/D Ratio  0.45   Macula  Geographic atrophy, Macular atrophy, Hard drusen, Disciform scar   Vessels  Normal   Periphery  Normal            IMAGING AND PROCEDURES  Imaging and Procedures for 09/16/22  OCT, Retina - OU - Both Eyes       Left Eye Quality was poor. Scan locations included subfoveal. Progression has been stable. Findings include abnormal foveal contour, disciform scar, outer  retinal atrophy.      Color Fundus Photography Optos - OU - Both Eyes       Right Eye Macula : retinal pigment epithelium abnormalities.   Left Eye Progression has been stable. Disc findings include normal observations. Macula : drusen, geographic atrophy, retinal pigment epithelium abnormalities.   Notes Macular disciform scars, geographic retinal pigment epithelial atrophy in left eye eye centrally no change in stable  OD no view posteriorly apparent dense vitreous hemorrhage     B-Scan Ultrasound - OD - Right Eye       Quality was good. Findings included choroidal detachment, vitreous hemorrhage.   Notes Total hemorrhagic choroidal detachment, and hemorrhage             ASSESSMENT/PLAN:  Vitreous hemorrhage of right eye (HCC) OD very dense hemorrhage not a salvageable eye.  We will make comfort the #1 goal right eye and use cycloplegic  Associated with pain suggesting this is a choroidal hemorrhage etiology  Choroidal hemorrhage, right Spontaneous with elevated intraocular pressure not likely with simple vitreous hemorrhage less likely a choroidal hemorrhage spontaneous with no injury, and no recent surgery.  May be associated with previous disciform macular scarring or perhaps a new ram nonetheless patient is on blood thinners not an operable candidate  Pain and elevated intraocular pressure suggest the choroidal hemorrhage component  Exudative age-related macular degeneration of left eye with inactive choroidal neovascularization (HCC) No sign of CNVM OS  History of corneal transplant No sign of graft inflammation  Secondary glaucoma due to combination mechanisms, right, moderate stage Choroidal very low chance of visual acuity improvement with drainage and high risk of complication rate.  Thus we will make pain control #1 the goal of this right eye     ICD-10-CM   1. Choroidal hemorrhage, right  H31.301     2. Vitreous hemorrhage of right eye (HCC)   H43.11 B-Scan Ultrasound - OD - Right Eye    3. Exudative age-related macular degeneration of right eye with inactive choroidal neovascularization (HCC)  H35.3212 OCT, Retina - OU - Both Eyes    Color Fundus Photography Optos - OU - Both Eyes    B-Scan Ultrasound - OD - Right Eye    4. Advanced nonexudative age-related macular degeneration of both eyes with subfoveal involvement  H35.3134     5. Bilateral dry eyes  H04.123     6. Exudative age-related macular degeneration of left eye with inactive choroidal neovascularization ([place])  H35.3222     7. History of corneal transplant  Z94.7     8. Secondary glaucoma due to combination mechanisms, right, moderate stage  H40.51X2       1.  Apparent dense vitreous hemorrhage likely etiology some element of spontaneous choroidal hemorrhage as patient has extensive disciform macular scarring from AMD, wet AMD exudative AMD as well as need for blood thinners.  2.  We will make control of pain #1 goal.  Follow-up in 3 weeks to assess clearance of vitreous hemorrhage.  If this does not clear may consider vitrectomy at that point.  3.  Commence with topical therapy atropine 1 drop right eye twice daily and prednisolone acetate 1 drop right eye twice daily for pain control  Ophthalmic Meds Ordered this visit:  Meds ordered this encounter  Medications   atropine 1 % ophthalmic solution    Sig: Place 1 drop into the right eye 2 (two) times daily.    Dispense:  2 mL    Refill:  12   prednisoLONE acetate (PRED FORTE) 1 % ophthalmic suspension    Sig: Place 1 drop into the right eye 2 (two) times daily.    Dispense:  10 mL    Refill:  3       Return in about 3 weeks (around 08/26/2022) for dilate, OD, B-scan OD.  There are no Patient Instructions on file for this visit.   Explained the diagnoses, plan, and follow up with the patient and they expressed understanding.  Patient expressed understanding of the importance of proper follow up care.    Douglas Walker M.D. Diseases & Surgery of the Retina and Vitreous [facility] [place] 09/16/22     Abbreviations: M myopia (nearsighted); A astigmatism; H hyperopia (farsighted); P presbyopia; Mrx spectacle prescription;  CTL contact lenses; OD right eye; OS left eye; OU both eyes  XT exotropia; ET esotropia; PEK punctate epithelial keratitis; PEE punctate epithelial erosions; DES dry eye syndrome; MGD meibomian gland dysfunction; ATs artificial tears; PFAT's preservative free artificial tears; [place] nuclear sclerotic cataract; PSC posterior subcapsular cataract; ERM epi-retinal membrane; PVD posterior vitreous detachment; RD retinal detachment; DM diabetes mellitus; DR diabetic retinopathy; NPDR non-proliferative diabetic retinopathy; PDR proliferative diabetic retinopathy; CSME clinically significant macular edema; DME diabetic macular edema; dbh dot blot hemorrhages; CWS cotton wool spot; POAG primary open angle glaucoma; C/D cup-to-disc ratio; HVF humphrey visual field; GVF goldmann visual field; OCT optical coherence tomography; IOP intraocular pressure; BRVO Branch retinal vein occlusion; CRVO central retinal vein occlusion; CRAO central retinal artery occlusion; BRAO branch retinal artery occlusion; RT retinal tear; SB scleral buckle; PPV pars plana vitrectomy; VH Vitreous hemorrhage; PRP panretinal laser photocoagulation;  IVK intravitreal kenalog; VMT vitreomacular traction; MH Macular hole;  NVD neovascularization of the disc; NVE neovascularization elsewhere; AREDS age related eye disease study; ARMD age related macular degeneration; POAG primary open angle glaucoma; EBMD epithelial/anterior basement membrane dystrophy; ACIOL anterior chamber intraocular lens; IOL intraocular lens; PCIOL posterior chamber intraocular lens; Phaco/IOL phacoemulsification with intraocular lens placement; Latimer photorefractive keratectomy; LASIK laser assisted in situ keratomileusis; HTN hypertension; DM  diabetes mellitus; COPD chronic obstructive pulmonary disease

## 2022-08-05 NOTE — Assessment & Plan Note (Signed)
Choroidal very low chance of visual acuity improvement with drainage and high risk of complication rate.  Thus we will make pain control #1 the goal of this right eye

## 2022-08-05 NOTE — Assessment & Plan Note (Addendum)
OD very dense hemorrhage not a salvageable eye.  We will make comfort the #1 goal right eye and use cycloplegic  Associated with pain suggesting this is a choroidal hemorrhage etiology

## 2022-08-05 NOTE — Assessment & Plan Note (Signed)
No sign of CNVM OS 

## 2022-08-05 NOTE — Assessment & Plan Note (Signed)
Spontaneous with elevated intraocular pressure not likely with simple vitreous hemorrhage less likely a choroidal hemorrhage spontaneous with no injury, and no recent surgery.  May be associated with previous disciform macular scarring or perhaps a new ram nonetheless patient is on blood thinners not an operable candidate  Pain and elevated intraocular pressure suggest the choroidal hemorrhage component

## 2022-08-08 ENCOUNTER — Ambulatory Visit (INDEPENDENT_AMBULATORY_CARE_PROVIDER_SITE_OTHER): Payer: Medicare Other | Admitting: Ophthalmology

## 2022-08-08 DIAGNOSIS — H4311 Vitreous hemorrhage, right eye: Secondary | ICD-10-CM

## 2022-08-08 DIAGNOSIS — H31301 Unspecified choroidal hemorrhage, right eye: Secondary | ICD-10-CM | POA: Diagnosis not present

## 2022-08-08 DIAGNOSIS — H5711 Ocular pain, right eye: Secondary | ICD-10-CM

## 2022-08-08 DIAGNOSIS — I2510 Atherosclerotic heart disease of native coronary artery without angina pectoris: Secondary | ICD-10-CM | POA: Diagnosis not present

## 2022-08-08 DIAGNOSIS — H5440 Blindness, one eye, unspecified eye: Secondary | ICD-10-CM | POA: Diagnosis not present

## 2022-08-08 MED ORDER — TIMOLOL MALEATE 0.5 % OP SOLN: 1.0000 [drp] | Freq: Two times a day (BID) | OPHTHALMIC | 1 refills | Status: DC

## 2022-08-08 MED ORDER — BRIMONIDINE TARTRATE 0.2 % OP SOLN: 1.0000 [drp] | Freq: Three times a day (TID) | OPHTHALMIC | 1 refills | Status: DC

## 2022-08-08 NOTE — Progress Notes (Signed)
08/08/2022     CHIEF COMPLAINT Patient presents for  Chief Complaint  Patient presents with   Eye Problem      HISTORY OF PRESENT ILLNESS: Douglas Walker is a 88 y.o. male who presents to the clinic today for:   HPI   WIP- Pain and Burning in OD. Pt stated, "I haven't slept well in the past 3 days. I had a sudden sharp pain in my right eye."   Last edited by Silvestre Moment on 08/08/2022  8:45 AM.      Referring physician: Josetta Huddle, MD [address] [place],  [place] [address]  HISTORICAL INFORMATION:   Selected notes from the MEDICAL RECORD NUMBER    Lab Results  Component Value Date   HGBA1C 5.3 01/29/2021     CURRENT MEDICATIONS: Current Outpatient Medications (Ophthalmic Drugs)  Medication Sig   erythromycin ophthalmic ointment SMARTSIG:In Eye(s)   tobramycin-dexamethasone (TOBRADEX) ophthalmic solution Place into the right eye.   No current facility-administered medications for this visit. (Ophthalmic Drugs)   Current Outpatient Medications (Other)  Medication Sig   acetaminophen (TYLENOL) 325 MG tablet Take 2 tablets (650 mg total) by mouth every 4 (four) hours as needed for mild pain (or temp > 37.5 C (99.5 F)).   apixaban (ELIQUIS) 5 MG TABS tablet Take 1 tablet (5 mg total) by mouth 2 (two) times daily.   Ascorbic Acid (VITAMIN C) 1000 MG tablet Take 1,000 mg by mouth daily.   DULoxetine (CYMBALTA) 60 MG capsule Take 1 capsule (60 mg total) by mouth daily.   furosemide (LASIX) 20 MG tablet Take 2 tablets (40 mg total) by mouth daily. You amy take 1 additional tablet daily as needed for swelling if top bp number over 105   levothyroxine (SYNTHROID) 25 MCG tablet Take 1 tablet (25 mcg total) by mouth daily before breakfast.   loratadine (CLARITIN) 10 MG tablet Take 10 mg by mouth daily.   magnesium hydroxide (MILK OF MAGNESIA) 400 MG/5ML suspension Take 30 mLs by mouth daily as needed for mild constipation.   Multiple Vitamin (MULTIVITAMIN  WITH MINERALS) TABS tablet Take 1 tablet by mouth daily.   Multiple Vitamins-Minerals (OCUVITE ADULT 50+) CAPS Take 1 capsule by mouth daily.   nitrofurantoin, macrocrystal-monohydrate, (MACROBID) 100 MG capsule Take 100 mg by mouth daily. Continuous course.   polyethylene glycol (MIRALAX / GLYCOLAX) 17 g packet Take 17 g by mouth daily. (Patient taking differently: Take 17 g by mouth daily as needed for mild constipation.)   predniSONE (DELTASONE) 5 MG tablet Take 5 mg by mouth daily.   pregabalin (LYRICA) 50 MG capsule Take 50 mg by mouth at bedtime.   primidone (MYSOLINE) 250 MG tablet Take 1 tablet (250 mg total) by mouth 2 (two) times daily.   topiramate (TOPAMAX) 50 MG tablet Take 1 tablet (50 mg total) by mouth 2 (two) times daily.   vitamin B-12 (CYANOCOBALAMIN) 1000 MCG tablet Take 1 tablet (1,000 mcg total) by mouth every other day. (Patient taking differently: Take 1,000 mcg by mouth See admin instructions. 1069mg oral daily on Sunday, Tuesday, Wednesday, Thursday, repeat.)   No current facility-administered medications for this visit. (Other)      REVIEW OF SYSTEMS: ROS   Negative for: Constitutional, Gastrointestinal, Neurological, Skin, Genitourinary, Musculoskeletal, HENT, Endocrine, Cardiovascular, Eyes, Respiratory, Psychiatric, Allergic/Imm, Heme/Lymph Last edited by MSilvestre Momenton 08/08/2022  8:45 AM.       ALLERGIES Allergies  Allergen Reactions   Sulfa Antibiotics Other (See  Comments)    Weakness Dizziness  Sweats    Mysoline [Primidone] Other (See Comments)    Sedation    Zocor [Simvastatin] Other (See Comments)    Arthralgias Fatigue    PAST MEDICAL HISTORY Past Medical History:  Diagnosis Date   Anemia    Arthritis    shoulders and back   Cancer ([place])    skin cancers   Chronic diastolic (congestive) heart failure (HCC)    Chronic low back pain 12/15/2017   CKD (chronic kidney disease) stage 3, GFR 30-59 ml/min (HCC)    Essential tremor 02/28/2016    GERD (gastroesophageal reflux disease)    Hernia    History of hiatal hernia    Hyperlipemia    Hypertension    Macular degeneration    Neuromuscular disorder (HCC)    neuropathy   Paroxysmal atrial fibrillation ([place]) 10/28/2013   Presence of permanent cardiac pacemaker 12/04/2018   Pulmonary hypertension ([place])    Stroke ([place]) 12/2020   Tachycardia-bradycardia syndrome ([place]) 09/13/2014   Thoracic aortic aneurysm (HCC)    Thrombocytopenia (HCC)    TIA (transient ischemic attack)    Past Surgical History:  Procedure Laterality Date   [place] / REPLACE / REMOVE PACEMAKER  12/04/2018   IRRIGATION AND DEBRIDEMENT ABSCESS Right 07/24/2020   Procedure: IRRIGATION AND DEBRIDEMENT HEMATOMA;  Surgeon: Coralie Keens, MD;  Location: [facility];  Service: General;  Laterality: Right;   MASS EXCISION Right 07/20/2020   Procedure: EXCISION OF RIGHT CHEST WALL MASS;  Surgeon: Stark Klein, MD;  Location: [facility];  Service: General;  Laterality: Right;   PACEMAKER IMPLANT N/A 12/04/2018   Procedure: PACEMAKER IMPLANT;  Surgeon: Evans Lance, MD;  Location: [facility];  Service: Cardiovascular;  Laterality: N/A;    FAMILY HISTORY Family History  Problem Relation Age of Onset   GI problems Mother    Other Sister        PAIN ISSUES   Hearing loss Sister    Blindness Sister    Heart attack Neg Hx    Stroke Neg Hx     SOCIAL HISTORY Social History   Tobacco Use   Smoking status: Former    Types: Cigarettes    Quit date: 12/30/1962    Years since quitting: 59.7   Smokeless tobacco: Never  Vaping Use   Vaping Use: Never used  Substance Use Topics   Alcohol use: No   Drug use: No         OPHTHALMIC EXAM:  Base Eye Exam     Visual Acuity (ETDRS)       Right Left   Dist cc LP CF at face    Correction: Glasses         Tonometry (Tonopen, 8:52 AM)       Right Left   Pressure 40 12          Pupils       Pupils APD   Right PERRL None   Left PERRL None         Visual Fields       Left Right   Restrictions Total inferior temporal, superior nasal deficiencies; Partial outer superior temporal, inferior nasal deficiencies Partial outer superior temporal, inferior temporal, superior nasal, inferior nasal deficiencies         Extraocular Movement       Right Left  Full Full         Neuro/Psych     Oriented x3: Yes   Mood/Affect: Normal         Dilation     Right eye: 2.5% Phenylephrine, 1.0% Mydriacyl @ 8:52 AM           Slit Lamp and Fundus Exam     External Exam       Right Left   External Normal Normal         Slit Lamp Exam       Right Left   Lids/Lashes Normal Normal   Conjunctiva/Sclera White and quiet White and quiet   Cornea old PKP graft.  Slightly edema but no endothelial cells Clear   Anterior Chamber dispersed hemorrhage, reddish hue, often seen with choroidal hemorrhage Deep and quiet   Iris Fixed, visible Round and reactive   Lens Posterior chamber intraocular lens, Centered posterior chamber intraocular lens Posterior chamber intraocular lens, Centered posterior chamber intraocular lens   Anterior Vitreous Dense hemorrhage against the posterior capsule Normal         Fundus Exam       Right Left   Posterior Vitreous Dense vitreous hemorrhage, no view posteriorly OD Posterior vitreous detachment   Disc  Normal   C/D Ratio  0.45   Macula  Geographic atrophy, Macular atrophy, Hard drusen, Disciform scar   Vessels  Normal   Periphery  Normal            IMAGING AND PROCEDURES  Imaging and Procedures for 09/23/22  Color Fundus Photography Optos - OU - Both Eyes       Right Eye Macula : retinal pigment epithelium abnormalities.   Left Eye Progression has been stable. Disc findings include normal observations. Macula : drusen, geographic atrophy, retinal pigment epithelium abnormalities.   Notes No  view  OS, stable     B-Scan Ultrasound - OD - Right Eye       Quality was good. Findings included choroidal detachment, extensive retinal detachment, vitreous hemorrhage.   Notes Total retinal detachment secondary to spontaneous expulsion of hemorrhagic choroidal detachments,             ASSESSMENT/PLAN:  Vitreous hemorrhage of right eye (HCC) Severe, dispersed  Choroidal hemorrhage, right Recent confirmation by B-scan ultrasonography with complete loss of all landmarks posteriorly associated with this type of severe pain and now elevated intraocular pressure likely Fontaine is choroidal hemorrhage on the patient in a patient with needing life-saving Coumadin therapy.  Blind painful right eye Onset some 2 weeks previous with spontaneous choroidal hemorrhage, secondary vitreous hemorrhage likely due to chronic anticoagulation needs, warfarin in conjunction with prior disciform macular scarring.  Secondary elevation intraocular pressure and severe aching pain most likely from the choroidal stretching.  Patient still not tolerating the pain despite use of topical atropine.  Will add IOP lowering medications   Blind painful right eye with choroidal hemorrhage and his need for chronic anticoagulation and profound vision loss this is not an operable condition to restore salvage meaningful vision in the right eye.  Is for this reason that I will refer to oculoplastics who may be able to temporize the condition with retrobulbar injection alcohol or consideration for evisceration for comfort reasons.  In the meantime we will add lower IOP lowering medications.     ICD-10-CM   1. Choroidal hemorrhage, right  H31.301 Color Fundus Photography Optos - OU - Both Eyes    B-Scan Ultrasound - OD - Right  Eye    2. Vitreous hemorrhage of right eye (HCC)  H43.11 B-Scan Ultrasound - OD - Right Eye    3. Blind painful right eye  H54.40    H57.11       1.  Continue on topical atropine  1 drop right eye twice daily  2.  Continue on topical Pred forte 1 drop right eye twice daily  3.  Will add timolol twice daily right eye  Will add brimonidine 1 drop right eye 3 times daily  4.  Referred to oculoplastics for opinion regarding possible temporizing techniques that might include retrobulbar alcohol injection and/or evisceration  Ophthalmic Meds Ordered this visit:  Meds ordered this encounter  Medications   DISCONTD: timolol (TIMOPTIC) 0.5 % ophthalmic solution    Sig: Place 1 drop into the right eye 2 (two) times daily.    Dispense:  5 mL    Refill:  1   DISCONTD: brimonidine (ALPHAGAN) 0.2 % ophthalmic solution    Sig: Place 1 drop into the right eye every 8 (eight) hours.    Dispense:  5 mL    Refill:  1       Return for Recommend oculoplastic evaluation Dr. Leonard Schwartz, possible retrobulbar alcohol or evisceration, OD.  There are no Patient Instructions on file for this visit.   Explained the diagnoses, plan, and follow up with the patient and they expressed understanding.  Patient expressed understanding of the importance of proper follow up care.   Clent Demark Alfio Loescher M.D. Diseases & Surgery of the Retina and Vitreous [facility] 09/23/22     Abbreviations: M myopia (nearsighted); A astigmatism; H hyperopia (farsighted); P presbyopia; Mrx spectacle prescription;  CTL contact lenses; OD right eye; OS left eye; OU both eyes  XT exotropia; ET esotropia; PEK punctate epithelial keratitis; PEE punctate epithelial erosions; DES dry eye syndrome; MGD meibomian gland dysfunction; ATs artificial tears; PFAT's preservative free artificial tears; [place] nuclear sclerotic cataract; PSC posterior subcapsular cataract; ERM epi-retinal membrane; PVD posterior vitreous detachment; RD retinal detachment; DM diabetes mellitus; DR diabetic retinopathy; NPDR non-proliferative diabetic retinopathy; PDR proliferative diabetic retinopathy; CSME clinically significant  macular edema; DME diabetic macular edema; dbh dot blot hemorrhages; CWS cotton wool spot; POAG primary open angle glaucoma; C/D cup-to-disc ratio; HVF humphrey visual field; GVF goldmann visual field; OCT optical coherence tomography; IOP intraocular pressure; BRVO Branch retinal vein occlusion; CRVO central retinal vein occlusion; CRAO central retinal artery occlusion; BRAO branch retinal artery occlusion; RT retinal tear; SB scleral buckle; PPV pars plana vitrectomy; VH Vitreous hemorrhage; PRP panretinal laser photocoagulation; IVK intravitreal kenalog; VMT vitreomacular traction; MH Macular hole;  NVD neovascularization of the disc; NVE neovascularization elsewhere; AREDS age related eye disease study; ARMD age related macular degeneration; POAG primary open angle glaucoma; EBMD epithelial/anterior basement membrane dystrophy; ACIOL anterior chamber intraocular lens; IOL intraocular lens; PCIOL posterior chamber intraocular lens; Phaco/IOL phacoemulsification with intraocular lens placement; [place] photorefractive keratectomy; LASIK laser assisted in situ keratomileusis; HTN hypertension; DM diabetes mellitus; COPD chronic obstructive pulmonary disease

## 2022-08-08 NOTE — Assessment & Plan Note (Signed)
Recent confirmation by B-scan ultrasonography with complete loss of all landmarks posteriorly associated with this type of severe pain and now elevated intraocular pressure likely Fontaine is choroidal hemorrhage on the patient in a patient with needing life-saving Coumadin therapy.

## 2022-08-08 NOTE — Assessment & Plan Note (Signed)
Onset some 2 weeks previous with spontaneous choroidal hemorrhage, secondary vitreous hemorrhage likely due to chronic anticoagulation needs, warfarin in conjunction with prior disciform macular scarring.  Secondary elevation intraocular pressure and severe aching pain most likely from the choroidal stretching.  Patient still not tolerating the pain despite use of topical atropine.  Will add IOP lowering medications   Blind painful right eye with choroidal hemorrhage and his need for chronic anticoagulation and profound vision loss this is not an operable condition to restore salvage meaningful vision in the right eye.  Is for this reason that I will refer to oculoplastics who may be able to temporize the condition with retrobulbar injection alcohol or consideration for evisceration for comfort reasons.  In the meantime we will add lower IOP lowering medications.

## 2022-08-08 NOTE — Assessment & Plan Note (Signed)
Severe, dispersed

## 2022-08-09 ENCOUNTER — Ambulatory Visit (HOSPITAL_COMMUNITY)
Admission: EM | Admit: 2022-08-09 | Discharge: 2022-08-09 | Disposition: A | Discharge status: Home / Self Care | Payer: Medicare Other

## 2022-08-09 ENCOUNTER — Telehealth: Payer: Self-pay

## 2022-08-09 ENCOUNTER — Encounter (HOSPITAL_COMMUNITY): Payer: Self-pay | Admitting: *Deleted

## 2022-08-09 ENCOUNTER — Encounter (HOSPITAL_COMMUNITY): Payer: Self-pay | Admitting: Emergency Medicine

## 2022-08-09 ENCOUNTER — Other Ambulatory Visit: Payer: Self-pay

## 2022-08-09 ENCOUNTER — Inpatient Hospital Stay (HOSPITAL_COMMUNITY)
Admission: EM | Admit: 2022-08-09 | Discharge: 2022-08-13 | DRG: 125 | Disposition: A | Discharge status: Home / Self Care | Payer: Medicare Other | Attending: Internal Medicine | Admitting: Internal Medicine

## 2022-08-09 ENCOUNTER — Emergency Department (HOSPITAL_COMMUNITY): Payer: Medicare Other

## 2022-08-09 DIAGNOSIS — I495 Sick sinus syndrome: Secondary | ICD-10-CM | POA: Diagnosis present

## 2022-08-09 DIAGNOSIS — R52 Pain, unspecified: Principal | ICD-10-CM | POA: Diagnosis present

## 2022-08-09 DIAGNOSIS — H5711 Ocular pain, right eye: Secondary | ICD-10-CM

## 2022-08-09 DIAGNOSIS — G629 Polyneuropathy, unspecified: Secondary | ICD-10-CM | POA: Diagnosis present

## 2022-08-09 DIAGNOSIS — Z792 Long term (current) use of antibiotics: Secondary | ICD-10-CM

## 2022-08-09 DIAGNOSIS — M545 Low back pain, unspecified: Secondary | ICD-10-CM | POA: Diagnosis present

## 2022-08-09 DIAGNOSIS — K219 Gastro-esophageal reflux disease without esophagitis: Secondary | ICD-10-CM | POA: Diagnosis present

## 2022-08-09 DIAGNOSIS — H31301 Unspecified choroidal hemorrhage, right eye: Principal | ICD-10-CM | POA: Diagnosis present

## 2022-08-09 DIAGNOSIS — G8929 Other chronic pain: Secondary | ICD-10-CM | POA: Diagnosis present

## 2022-08-09 DIAGNOSIS — I480 Paroxysmal atrial fibrillation: Secondary | ICD-10-CM | POA: Diagnosis not present

## 2022-08-09 DIAGNOSIS — M19012 Primary osteoarthritis, left shoulder: Secondary | ICD-10-CM | POA: Diagnosis present

## 2022-08-09 DIAGNOSIS — Z85828 Personal history of other malignant neoplasm of skin: Secondary | ICD-10-CM | POA: Diagnosis not present

## 2022-08-09 DIAGNOSIS — G250 Essential tremor: Secondary | ICD-10-CM | POA: Diagnosis present

## 2022-08-09 DIAGNOSIS — N183 Chronic kidney disease, stage 3 unspecified: Secondary | ICD-10-CM | POA: Diagnosis present

## 2022-08-09 DIAGNOSIS — M19011 Primary osteoarthritis, right shoulder: Secondary | ICD-10-CM | POA: Diagnosis present

## 2022-08-09 DIAGNOSIS — I130 Hypertensive heart and chronic kidney disease with heart failure and stage 1 through stage 4 chronic kidney disease, or unspecified chronic kidney disease: Secondary | ICD-10-CM | POA: Diagnosis present

## 2022-08-09 DIAGNOSIS — I10 Essential (primary) hypertension: Secondary | ICD-10-CM | POA: Diagnosis present

## 2022-08-09 DIAGNOSIS — H31411 Hemorrhagic choroidal detachment, right eye: Secondary | ICD-10-CM | POA: Diagnosis not present

## 2022-08-09 DIAGNOSIS — N1831 Chronic kidney disease, stage 3a: Secondary | ICD-10-CM | POA: Diagnosis not present

## 2022-08-09 DIAGNOSIS — Z950 Presence of cardiac pacemaker: Secondary | ICD-10-CM | POA: Diagnosis not present

## 2022-08-09 DIAGNOSIS — Z8673 Personal history of transient ischemic attack (TIA), and cerebral infarction without residual deficits: Secondary | ICD-10-CM | POA: Diagnosis not present

## 2022-08-09 DIAGNOSIS — Z888 Allergy status to other drugs, medicaments and biological substances status: Secondary | ICD-10-CM

## 2022-08-09 DIAGNOSIS — Z882 Allergy status to sulfonamides status: Secondary | ICD-10-CM

## 2022-08-09 DIAGNOSIS — Z79890 Hormone replacement therapy (postmenopausal): Secondary | ICD-10-CM

## 2022-08-09 DIAGNOSIS — Z821 Family history of blindness and visual loss: Secondary | ICD-10-CM

## 2022-08-09 DIAGNOSIS — M479 Spondylosis, unspecified: Secondary | ICD-10-CM | POA: Diagnosis present

## 2022-08-09 DIAGNOSIS — H3530 Unspecified macular degeneration: Secondary | ICD-10-CM | POA: Diagnosis present

## 2022-08-09 DIAGNOSIS — R8290 Unspecified abnormal findings in urine: Secondary | ICD-10-CM | POA: Diagnosis not present

## 2022-08-09 DIAGNOSIS — Z7901 Long term (current) use of anticoagulants: Secondary | ICD-10-CM | POA: Diagnosis not present

## 2022-08-09 DIAGNOSIS — D696 Thrombocytopenia, unspecified: Secondary | ICD-10-CM | POA: Diagnosis not present

## 2022-08-09 DIAGNOSIS — I5032 Chronic diastolic (congestive) heart failure: Secondary | ICD-10-CM | POA: Diagnosis not present

## 2022-08-09 DIAGNOSIS — Z66 Do not resuscitate: Secondary | ICD-10-CM | POA: Diagnosis present

## 2022-08-09 DIAGNOSIS — H9190 Unspecified hearing loss, unspecified ear: Secondary | ICD-10-CM | POA: Diagnosis present

## 2022-08-09 DIAGNOSIS — I272 Pulmonary hypertension, unspecified: Secondary | ICD-10-CM | POA: Diagnosis present

## 2022-08-09 DIAGNOSIS — H3561 Retinal hemorrhage, right eye: Secondary | ICD-10-CM | POA: Diagnosis not present

## 2022-08-09 DIAGNOSIS — Z79899 Other long term (current) drug therapy: Secondary | ICD-10-CM

## 2022-08-09 DIAGNOSIS — R519 Headache, unspecified: Secondary | ICD-10-CM | POA: Diagnosis not present

## 2022-08-09 DIAGNOSIS — Z87891 Personal history of nicotine dependence: Secondary | ICD-10-CM

## 2022-08-09 DIAGNOSIS — H44811 Hemophthalmos, right eye: Secondary | ICD-10-CM

## 2022-08-09 DIAGNOSIS — I160 Hypertensive urgency: Secondary | ICD-10-CM | POA: Diagnosis present

## 2022-08-09 LAB — CBC
HCT: 32.9 % — ABNORMAL LOW (ref 39.0–52.0)
Hemoglobin: 11.2 g/dL — ABNORMAL LOW (ref 13.0–17.0)
MCH: 33 pg (ref 26.0–34.0)
MCHC: 34 g/dL (ref 30.0–36.0)
MCV: 97.1 fL (ref 80.0–100.0)
Platelets: 93 10*3/uL — ABNORMAL LOW (ref 150–400)
RBC: 3.39 MIL/uL — ABNORMAL LOW (ref 4.22–5.81)
RDW: 14.6 % (ref 11.5–15.5)
WBC: 8.9 10*3/uL (ref 4.0–10.5)
nRBC: 0 % (ref 0.0–0.2)

## 2022-08-09 LAB — BASIC METABOLIC PANEL
Anion gap: 10 (ref 5–15)
BUN: 22 mg/dL (ref 8–23)
CO2: 27 mmol/L (ref 22–32)
Calcium: 9.4 mg/dL (ref 8.9–10.3)
Chloride: 97 mmol/L — ABNORMAL LOW (ref 98–111)
Creatinine, Ser: 1.29 mg/dL — ABNORMAL HIGH (ref 0.61–1.24)
GFR, Estimated: 51 mL/min — ABNORMAL LOW (ref >60)
Glucose, Bld: 108 mg/dL — ABNORMAL HIGH (ref 70–99)
Potassium: 3.9 mmol/L (ref 3.5–5.1)
Sodium: 134 mmol/L — ABNORMAL LOW (ref 135–145)

## 2022-08-09 LAB — URINALYSIS, ROUTINE W REFLEX MICROSCOPIC
Bacteria, UA: NONE SEEN
Bilirubin Urine: NEGATIVE
Glucose, UA: NEGATIVE mg/dL
Ketones, ur: NEGATIVE mg/dL
Nitrite: POSITIVE — AB
Protein, ur: NEGATIVE mg/dL
Specific Gravity, Urine: 1.005 (ref 1.005–1.030)
pH: 7 (ref 5.0–8.0)

## 2022-08-09 MED ORDER — IOHEXOL 300 MG/ML  SOLN
75.0000 mL | Freq: Once | INTRAMUSCULAR | Status: AC | PRN
Start: 2022-08-09 — End: 2022-08-09
  Administered 2022-08-09: 75 mL via INTRAVENOUS

## 2022-08-09 NOTE — Telephone Encounter (Signed)
Patient's son called stating patient has had a low grade fever for a couple of days and this morning his temp was 99.5. Patient also having headaches, urine odor and cloudy urine. Patient also currently taking the [place]. Patient's son states the patient is having no other symptoms. I did advise him that Dr. Gale Journey is currently out of the office. I also recommended ED and UC for the patient. Please advise Lucion Dilger T Brooks Sailors

## 2022-08-09 NOTE — Progress Notes (Signed)
Remote pacemaker transmission.   

## 2022-08-09 NOTE — ED Triage Notes (Signed)
Patient was seen yesterday for right eye pain and headache by eye doctor. Patient was referred to Dr Leonard Schwartz for further evaluation. He is already using eye drops given by eye doctor. Here for eye pain and states that he has a temp 99.5 which son states if high for patient. They seen  PA at his PCP office which did UA (PCP is Dr Inda Merlin). Per son the office said increase fluids.

## 2022-08-09 NOTE — Telephone Encounter (Signed)
I spoke to the patient's son and advised him to make sure the patient is hydrating adequately to see if that improves the symptoms also if no improvement or patient develops additional symptoms he should get further care. Patient's son agrees with the plan and stated they were also able to get the patient in with his pcp today to be evaluated.  Saylah Ketner T Brooks Sailors

## 2022-08-09 NOTE — ED Triage Notes (Addendum)
Patient reports persistent right eye pain/headache onset this week , seen by ophthalmologist yesterday prescribed with eye drops with no relief. Family also requesting urine test for UTI .

## 2022-08-09 NOTE — ED Provider Notes (Signed)
Presents to urgent care with 9/10 right eye pain. His son is present and provides the history. Was seen by eye specialist yesterday and diagnosed with hemorrhage of the eye. Put on timolol and brimonidine drops which have not helped with pain, in addition to using atropine and prednisone drops.  Reports severe pain, headache, pain with eye movement. On exam, chemosis is noted with subconjunctival hemorrhage. Clear drainage. Cloudy lens although patient has history of glaucoma and is blind in the right eye. Afebrile.  With severe pain and pain with eye motion, patient likely needs CAT scan of the orbits to evaluate for infection.  Discussed with son that patient needs a higher level of care than what we can offer him in the urgent care.  He is discharged to the emergency department via personal vehicle.     Kyra Leyland 08/09/22 1925

## 2022-08-09 NOTE — Discharge Instructions (Signed)
D/c to ED

## 2022-08-09 NOTE — ED Provider Triage Note (Signed)
Emergency Medicine Provider Triage Evaluation Note  Douglas Walker , a 88 y.o. male  was evaluated in triage.  Pt complains of right eye pain for the last 3 weeks.  Patient presents with son who provides majority the history.  Per patient's son, the patient has had a painful right eye and headache for the last 3 weeks.  The patient has been seen by his ophthalmologist numerous times, provided with atropine, steroids, timolol drops.  The patient continues to complain of right eye pain.  Patient recently diagnosed with choroidal hemorrhage of right eye and referred to oculoplastics.  This is a patient of Dr. Zadie Rhine.  Patient was seen in urgent care and referred to the ED for further management.  The patient does have painful eye movement, hemorrhaging to the right eye.  Review of Systems  Positive:  Negative:   Physical Exam  BP (!) 176/86   Pulse 70   Temp 99.5 F (37.5 C) (Oral)   Resp 18   SpO2 96%  Gen:   Awake, no distress   Resp:  Normal effort  MSK:   Moves extremities without difficulty  Other:    Medical Decision Making  Medically screening exam initiated at 8:49 PM.  Appropriate orders placed.  [name] was informed that the remainder of the evaluation will be completed by another provider, this initial triage assessment does not replace that evaluation, and the importance of remaining in the ED until their evaluation is complete.     Azucena Cecil, PA-C 08/09/22 2050

## 2022-08-09 NOTE — Telephone Encounter (Signed)
With no other symptoms would recommend adequate hydration first to see if that improves the cloudy and urine odor and possibly even the headache. Would recommend further care if he has continued fevers or develops any other urinary symptoms.

## 2022-08-10 ENCOUNTER — Encounter (HOSPITAL_COMMUNITY): Payer: Self-pay | Admitting: Family Medicine

## 2022-08-10 DIAGNOSIS — M19011 Primary osteoarthritis, right shoulder: Secondary | ICD-10-CM | POA: Diagnosis present

## 2022-08-10 DIAGNOSIS — G250 Essential tremor: Secondary | ICD-10-CM | POA: Diagnosis present

## 2022-08-10 DIAGNOSIS — N1831 Chronic kidney disease, stage 3a: Secondary | ICD-10-CM | POA: Diagnosis present

## 2022-08-10 DIAGNOSIS — R52 Pain, unspecified: Principal | ICD-10-CM | POA: Diagnosis present

## 2022-08-10 DIAGNOSIS — D696 Thrombocytopenia, unspecified: Secondary | ICD-10-CM

## 2022-08-10 DIAGNOSIS — Z8673 Personal history of transient ischemic attack (TIA), and cerebral infarction without residual deficits: Secondary | ICD-10-CM | POA: Diagnosis not present

## 2022-08-10 DIAGNOSIS — M545 Low back pain, unspecified: Secondary | ICD-10-CM | POA: Diagnosis present

## 2022-08-10 DIAGNOSIS — K219 Gastro-esophageal reflux disease without esophagitis: Secondary | ICD-10-CM | POA: Diagnosis present

## 2022-08-10 DIAGNOSIS — Z792 Long term (current) use of antibiotics: Secondary | ICD-10-CM | POA: Diagnosis not present

## 2022-08-10 DIAGNOSIS — Z66 Do not resuscitate: Secondary | ICD-10-CM | POA: Diagnosis present

## 2022-08-10 DIAGNOSIS — Z85828 Personal history of other malignant neoplasm of skin: Secondary | ICD-10-CM | POA: Diagnosis not present

## 2022-08-10 DIAGNOSIS — H31301 Unspecified choroidal hemorrhage, right eye: Secondary | ICD-10-CM

## 2022-08-10 DIAGNOSIS — I272 Pulmonary hypertension, unspecified: Secondary | ICD-10-CM | POA: Diagnosis present

## 2022-08-10 DIAGNOSIS — I10 Essential (primary) hypertension: Secondary | ICD-10-CM

## 2022-08-10 DIAGNOSIS — G629 Polyneuropathy, unspecified: Secondary | ICD-10-CM | POA: Diagnosis present

## 2022-08-10 DIAGNOSIS — I130 Hypertensive heart and chronic kidney disease with heart failure and stage 1 through stage 4 chronic kidney disease, or unspecified chronic kidney disease: Secondary | ICD-10-CM | POA: Diagnosis present

## 2022-08-10 DIAGNOSIS — Z79890 Hormone replacement therapy (postmenopausal): Secondary | ICD-10-CM | POA: Diagnosis not present

## 2022-08-10 DIAGNOSIS — H5711 Ocular pain, right eye: Secondary | ICD-10-CM | POA: Diagnosis present

## 2022-08-10 DIAGNOSIS — M19012 Primary osteoarthritis, left shoulder: Secondary | ICD-10-CM | POA: Diagnosis present

## 2022-08-10 DIAGNOSIS — H3530 Unspecified macular degeneration: Secondary | ICD-10-CM | POA: Diagnosis present

## 2022-08-10 DIAGNOSIS — I5032 Chronic diastolic (congestive) heart failure: Secondary | ICD-10-CM | POA: Diagnosis present

## 2022-08-10 DIAGNOSIS — I480 Paroxysmal atrial fibrillation: Secondary | ICD-10-CM | POA: Diagnosis not present

## 2022-08-10 DIAGNOSIS — G8929 Other chronic pain: Secondary | ICD-10-CM | POA: Diagnosis present

## 2022-08-10 DIAGNOSIS — M479 Spondylosis, unspecified: Secondary | ICD-10-CM | POA: Diagnosis present

## 2022-08-10 DIAGNOSIS — I495 Sick sinus syndrome: Secondary | ICD-10-CM | POA: Diagnosis present

## 2022-08-10 DIAGNOSIS — Z950 Presence of cardiac pacemaker: Secondary | ICD-10-CM | POA: Diagnosis not present

## 2022-08-10 DIAGNOSIS — Z7901 Long term (current) use of anticoagulants: Secondary | ICD-10-CM | POA: Diagnosis not present

## 2022-08-10 LAB — CBC
HCT: 32 % — ABNORMAL LOW (ref 39.0–52.0)
Hemoglobin: 11.2 g/dL — ABNORMAL LOW (ref 13.0–17.0)
MCH: 33.5 pg (ref 26.0–34.0)
MCHC: 35 g/dL (ref 30.0–36.0)
MCV: 95.8 fL (ref 80.0–100.0)
Platelets: 81 10*3/uL — ABNORMAL LOW (ref 150–400)
RBC: 3.34 MIL/uL — ABNORMAL LOW (ref 4.22–5.81)
RDW: 14.6 % (ref 11.5–15.5)
WBC: 8.6 10*3/uL (ref 4.0–10.5)
nRBC: 0 % (ref 0.0–0.2)

## 2022-08-10 LAB — PROTIME-INR
INR: 2.4 — ABNORMAL HIGH (ref 0.8–1.2)
Prothrombin Time: 26 seconds — ABNORMAL HIGH (ref 11.4–15.2)

## 2022-08-10 LAB — BASIC METABOLIC PANEL
Anion gap: 11 (ref 5–15)
BUN: 20 mg/dL (ref 8–23)
CO2: 25 mmol/L (ref 22–32)
Calcium: 9 mg/dL (ref 8.9–10.3)
Chloride: 98 mmol/L (ref 98–111)
Creatinine, Ser: 1.27 mg/dL — ABNORMAL HIGH (ref 0.61–1.24)
GFR, Estimated: 52 mL/min — ABNORMAL LOW (ref >60)
Glucose, Bld: 107 mg/dL — ABNORMAL HIGH (ref 70–99)
Potassium: 4 mmol/L (ref 3.5–5.1)
Sodium: 134 mmol/L — ABNORMAL LOW (ref 135–145)

## 2022-08-10 MED ORDER — TIMOLOL MALEATE 0.5 % OP SOLN
1.0000 [drp] | Freq: Two times a day (BID) | OPHTHALMIC | Status: DC
Start: 2022-08-10 — End: 2022-08-11
  Administered 2022-08-10: 1 [drp] via OPHTHALMIC
  Filled 2022-08-10: qty 5

## 2022-08-10 MED ORDER — LEVOTHYROXINE SODIUM 25 MCG PO TABS
25.0000 ug | ORAL_TABLET | Freq: Every day | ORAL | Status: DC
  Administered 2022-08-10 – 2022-08-13 (×4): 25 ug via ORAL
  Filled 2022-08-10 (×4): qty 1

## 2022-08-10 MED ORDER — BRIMONIDINE TARTRATE 0.2 % OP SOLN
1.0000 [drp] | Freq: Three times a day (TID) | OPHTHALMIC | Status: DC
  Administered 2022-08-10 – 2022-08-13 (×9): 1 [drp] via OPHTHALMIC
  Filled 2022-08-10: qty 5

## 2022-08-10 MED ORDER — ATROPINE SULFATE 1 % OP SOLN
1.0000 [drp] | Freq: Two times a day (BID) | OPHTHALMIC | Status: DC
  Administered 2022-08-10 – 2022-08-13 (×6): 1 [drp] via OPHTHALMIC
  Filled 2022-08-10: qty 2

## 2022-08-10 MED ORDER — OXYCODONE-ACETAMINOPHEN 5-325 MG PO TABS
1.0000 | ORAL_TABLET | Freq: Once | ORAL | Status: AC
  Administered 2022-08-10: 1 via ORAL
  Filled 2022-08-10: qty 1

## 2022-08-10 MED ORDER — ACETAMINOPHEN 325 MG PO TABS
650.0000 mg | ORAL_TABLET | Freq: Four times a day (QID) | ORAL | Status: DC
  Administered 2022-08-10 – 2022-08-13 (×13): 650 mg via ORAL
  Filled 2022-08-10 (×13): qty 2

## 2022-08-10 MED ORDER — WARFARIN - PHARMACIST DOSING INPATIENT: Freq: Every day | Status: DC

## 2022-08-10 MED ORDER — HYDROMORPHONE HCL 1 MG/ML IJ SOLN
0.5000 mg | Freq: Once | INTRAMUSCULAR | Status: AC
  Administered 2022-08-10: 0.5 mg via INTRAVENOUS
  Filled 2022-08-10: qty 1

## 2022-08-10 MED ORDER — HYDRALAZINE HCL 25 MG PO TABS: 25.0000 mg | ORAL_TABLET | Freq: Four times a day (QID) | ORAL | Status: DC | PRN

## 2022-08-10 MED ORDER — HYDRALAZINE HCL 25 MG PO TABS
25.0000 mg | ORAL_TABLET | Freq: Three times a day (TID) | ORAL | Status: DC
  Administered 2022-08-10 – 2022-08-12 (×6): 25 mg via ORAL
  Filled 2022-08-10 (×6): qty 1

## 2022-08-10 MED ORDER — PREGABALIN 50 MG PO CAPS
50.0000 mg | ORAL_CAPSULE | Freq: Every day | ORAL | Status: DC
  Administered 2022-08-10 – 2022-08-12 (×3): 50 mg via ORAL
  Filled 2022-08-10 (×3): qty 1

## 2022-08-10 MED ORDER — DULOXETINE HCL 60 MG PO CPEP
60.0000 mg | ORAL_CAPSULE | Freq: Every day | ORAL | Status: DC
  Administered 2022-08-11 – 2022-08-13 (×3): 60 mg via ORAL
  Filled 2022-08-10 (×3): qty 1

## 2022-08-10 MED ORDER — WARFARIN SODIUM 7.5 MG PO TABS
7.5000 mg | ORAL_TABLET | Freq: Once | ORAL | Status: AC
Start: 2022-08-10 — End: 2022-08-10
  Administered 2022-08-10: 7.5 mg via ORAL
  Filled 2022-08-10: qty 1

## 2022-08-10 MED ORDER — TOPIRAMATE 25 MG PO TABS
50.0000 mg | ORAL_TABLET | Freq: Two times a day (BID) | ORAL | Status: DC
  Administered 2022-08-10 – 2022-08-13 (×6): 50 mg via ORAL
  Filled 2022-08-10 (×6): qty 2

## 2022-08-10 MED ORDER — POLYETHYLENE GLYCOL 3350 17 G PO PACK: 17.0000 g | PACK | Freq: Every day | ORAL | Status: DC | PRN

## 2022-08-10 MED ORDER — NITROFURANTOIN MONOHYD MACRO 100 MG PO CAPS
100.0000 mg | ORAL_CAPSULE | Freq: Every day | ORAL | Status: DC
  Administered 2022-08-10 – 2022-08-13 (×4): 100 mg via ORAL
  Filled 2022-08-10 (×5): qty 1

## 2022-08-10 MED ORDER — PREDNISOLONE ACETATE 1 % OP SUSP
1.0000 [drp] | Freq: Two times a day (BID) | OPHTHALMIC | Status: DC
  Administered 2022-08-10: 1 [drp] via OPHTHALMIC
  Filled 2022-08-10: qty 5

## 2022-08-10 MED ORDER — HYDROMORPHONE HCL 1 MG/ML IJ SOLN
0.5000 mg | INTRAMUSCULAR | Status: DC | PRN
  Administered 2022-08-10 – 2022-08-11 (×5): 0.5 mg via INTRAVENOUS
  Filled 2022-08-10 (×3): qty 0.5
  Filled 2022-08-10: qty 1
  Filled 2022-08-10: qty 0.5

## 2022-08-10 MED ORDER — FUROSEMIDE 40 MG PO TABS
40.0000 mg | ORAL_TABLET | Freq: Every day | ORAL | Status: DC
  Administered 2022-08-11 – 2022-08-13 (×3): 40 mg via ORAL
  Filled 2022-08-10 (×3): qty 1

## 2022-08-10 MED ORDER — CHLORHEXIDINE GLUCONATE CLOTH 2 % EX PADS
6.0000 | MEDICATED_PAD | Freq: Every day | CUTANEOUS | Status: DC
Start: 2022-08-10 — End: 2022-08-13
  Administered 2022-08-10 – 2022-08-13 (×3): 6 via TOPICAL

## 2022-08-10 MED ORDER — OXYCODONE HCL 5 MG PO TABS
5.0000 mg | ORAL_TABLET | ORAL | Status: DC | PRN
  Administered 2022-08-10 – 2022-08-13 (×8): 5 mg via ORAL
  Filled 2022-08-10 (×8): qty 1

## 2022-08-10 NOTE — ED Provider Notes (Signed)
[facility] EMERGENCY DEPARTMENT Provider Note   CSN: [id number] Arrival date & time: 08/09/22  1930     History  Chief Complaint  Patient presents with   Right Eye Pain / UTI     Douglas Walker is a 88 y.o. male.  HPI Patient sent in for intractable right eye pain.  Has a history of choroidal hemorrhage on the right side.  Began around 2 weeks ago.  Has been seeing Dr. Zadie Rhine for it.  Has plan to see oculoplastics.  Right eye vision is not considered salvageable.  Is on Coumadin for atrial fibrillation and head bleed.  Has been on various drops without consistent pain relief.  Is on Ultram.  Has been unable to sleep due to the pain. Was seen at urgent care and reportedly sent here for CT scan to rule out infection. Also normally is somewhat hypothermic and reportedly temperature was more normal.  Urine is cloudy but has chronic Foley catheter.  Family was worried about UTI.  Sees Dr. Gale Journey from infectious disease for frequent UTIs.  No fevers and no confusion.   Past Medical History:  Diagnosis Date   Arthritis    shoulders and back   Ascending aorta dilatation (HCC)    Cancer (HCC)    skin cancers   Chronic diastolic (congestive) heart failure (HCC)    Chronic low back pain 12/15/2017   CKD (chronic kidney disease) stage 3, GFR 30-59 ml/min (HCC)    Dysrhythmia     Paroxysmal atrial fibrillation; Tachycardia-bradycardia syndrome   Essential tremor 02/28/2016   GERD (gastroesophageal reflux disease)    Hernia    History of hiatal hernia    Hyperlipemia    Hypertension    Macular degeneration    Neuromuscular disorder (HCC)    neuropathy   Paroxysmal atrial fibrillation ([place]) 10/28/2013   Presence of permanent cardiac pacemaker 12/04/2018   Pulmonary hypertension ([place])    Stroke ([place]) 12/2020   Tachycardia-bradycardia syndrome ([place]) 09/13/2014   TIA (transient ischemic attack)     Home Medications Prior to Admission medications   Medication Sig  Start Date End Date Taking? Authorizing Provider  acetaminophen (TYLENOL) 325 MG tablet Take 2 tablets (650 mg total) by mouth every 4 (four) hours as needed for mild pain (or temp > 37.5 C (99.5 F)). 02/14/21   Angiulli, Lavon Paganini, PA-C  Ascorbic Acid (VITAMIN C) 1000 MG tablet Take 1,000 mg by mouth daily.    [provider]  atropine 1 % ophthalmic solution Place 1 drop into the right eye 2 (two) times daily. 08/05/22   Rankin, Clent Demark, MD  brimonidine (ALPHAGAN) 0.2 % ophthalmic solution Place 1 drop into the right eye every 8 (eight) hours. 08/08/22 08/08/23  Rankin, Clent Demark, MD  brimonidine (ALPHAGAN) 0.2 % ophthalmic solution 1 drop into affected eye    [provider]  DULoxetine (CYMBALTA) 60 MG capsule Take 1 capsule (60 mg total) by mouth daily. 04/23/21   Lovorn, Jinny Blossom, MD  furosemide (LASIX) 20 MG tablet Take 2 tablets (40 mg total) by mouth daily. 02/14/21   Angiulli, Lavon Paganini, PA-C  levothyroxine (SYNTHROID) 25 MCG tablet Take 1 tablet (25 mcg total) by mouth daily before breakfast. 02/14/21   Angiulli, Lavon Paganini, PA-C  loratadine (CLARITIN) 10 MG tablet Take 10 mg by mouth daily.    [provider]  Multiple Vitamin (MULTIVITAMIN WITH MINERALS) TABS tablet Take 1 tablet by mouth daily.    [provider]  Multiple Vitamins-Minerals (OCUVITE ADULT 50+) CAPS Take 1 capsule by mouth daily.    [provider]  nitrofurantoin, macrocrystal-monohydrate, (MACROBID) 100 MG capsule Take 100 mg by mouth daily.    [provider]  phenazopyridine (PYRIDIUM) 200 MG tablet Take 200 mg by mouth See admin instructions. '200mg'$  oral at night And '200mg'$  extra twice daily as needed for bladder spasms 10/05/21   [provider]  polyethylene glycol (MIRALAX / GLYCOLAX) 17 g packet Take 17 g by mouth daily. Patient taking differently: Take 17 g by mouth daily as needed for mild constipation. 07/08/21   Mariel Aloe, MD  prednisoLONE acetate (PRED FORTE) 1  % ophthalmic suspension Place 1 drop into the right eye 2 (two) times daily. 08/05/22   Rankin, Clent Demark, MD  prednisoLONE acetate (PRED FORTE) 1 % ophthalmic suspension 1 drop into affected eye    [provider]  pregabalin (LYRICA) 25 MG capsule Take 2 capsules (50 mg total) by mouth at bedtime. TAKE 1 CAPSULE(25 MG) BY MOUTH AT BEDTIME 10/29/21   Ward Givens, NP  primidone (MYSOLINE) 250 MG tablet Take 1 tablet (250 mg total) by mouth 2 (two) times daily. Patient taking differently: Take 250 mg by mouth 2 (two) times daily. Taking '100mg'$  02/14/21   Angiulli, Lavon Paganini, PA-C  timolol (TIMOPTIC) 0.5 % ophthalmic solution Place 1 drop into the right eye 2 (two) times daily. 08/08/22 08/08/23  Rankin, Clent Demark, MD  timolol (TIMOPTIC) 0.5 % ophthalmic solution 1 drop into affected eye    [provider]  topiramate (TOPAMAX) 50 MG tablet Take 1 tablet (50 mg total) by mouth 2 (two) times daily. 02/27/22   Ward Givens, NP  traMADol (ULTRAM) 50 MG tablet Take 50-100 mg by mouth 3 (three) times daily as needed. 10/30/21   [provider]  vitamin B-12 (CYANOCOBALAMIN) 1000 MCG tablet Take 1 tablet (1,000 mcg total) by mouth every other day. Patient taking differently: Take 1,000 mcg by mouth See admin instructions. 1010mg oral daily on Sunday, Tuesday, Wednesday, Thursday, repeat. 02/14/21   Angiulli, DLavon Paganini PA-C  warfarin (COUMADIN) 5 MG tablet TAKE 1 AND 1/2 TO 2 TABLETS BY MOUTH ONCE DAILY OR AS DIRECTED BY COUMADIN CLINIC 07/08/22   SBelva Crome MD      Allergies    Sulfa antibiotics, Primidone, and Simvastatin    Review of Systems   Review of Systems  Physical Exam Updated Vital Signs BP (!) 180/86   Pulse 70   Temp 98.8 F (37.1 C) (Oral)   Resp 18   SpO2 92%  Physical Exam Vitals reviewed.  HENT:     Head:     Comments: Right I held closed.  Some chemosis and redness of eye. Cardiovascular:     Rate and Rhythm: Normal rate.  Pulmonary:     Breath  sounds: No wheezing.  Abdominal:     Tenderness: There is no abdominal tenderness.  Musculoskeletal:        General: No tenderness.  Skin:    General: Skin is warm.  Neurological:     Mental Status: He is alert.     Comments: Somewhat hard of hearing but able to answer questions.     ED Results / Procedures / Treatments   Labs (all labs ordered are listed, but only abnormal results are displayed) Labs Reviewed  BASIC METABOLIC PANEL - Abnormal; Notable for the following components:      Result Value   Sodium 134 (*)  Chloride 97 (*)    Glucose, Bld 108 (*)    Creatinine, Ser 1.29 (*)    GFR, Estimated 51 (*)    All other components within normal limits  CBC - Abnormal; Notable for the following components:   RBC 3.39 (*)    Hemoglobin 11.2 (*)    HCT 32.9 (*)    Platelets 93 (*)    All other components within normal limits  URINALYSIS, ROUTINE W REFLEX MICROSCOPIC - Abnormal; Notable for the following components:   Color, Urine STRAW (*)    Hgb urine dipstick LARGE (*)    Nitrite POSITIVE (*)    Leukocytes,Ua MODERATE (*)    All other components within normal limits  URINE CULTURE    EKG None  Radiology CT Orbits W Contrast  Result Date: 08/09/2022 CLINICAL DATA:  Retinal disorder. EXAM: CT ORBITS WITH CONTRAST TECHNIQUE: Multidetector CT images was performed according to the standard protocol following intravenous contrast administration. RADIATION DOSE REDUCTION: This exam was performed according to the departmental dose-optimization program which includes automated exposure control, adjustment of the mA and/or kV according to patient size and/or use of iterative reconstruction technique. CONTRAST:  45m OMNIPAQUE IOHEXOL 300 MG/ML  SOLN COMPARISON:  None Available. FINDINGS: Orbits: A large amount heterogeneous increased attenuation (approximately 90.04 Hounsfield units) is seen within the right globe. The left globe is unremarkable. No evidence of orbital  inflammation. Normal appearance of the optic nerve-sheath complexes, extraocular muscles, orbital fat and lacrimal glands. Visible paranasal sinuses: Clear. Soft tissues: Normal. Osseous: No fracture or aggressive lesion. Limited intracranial: No acute or significant finding. IMPRESSION: Large amount of hemorrhagic material within the right globe. Correlation with ophthalmologic examination is recommended. Electronically Signed   By: TVirgina NorfolkM.D.   On: 08/09/2022 23:25   CT Head Wo Contrast  Result Date: 08/09/2022 CLINICAL DATA:  Headache. EXAM: CT HEAD WITHOUT CONTRAST TECHNIQUE: Contiguous axial images were obtained from the base of the skull through the vertex without intravenous contrast. RADIATION DOSE REDUCTION: This exam was performed according to the departmental dose-optimization program which includes automated exposure control, adjustment of the mA and/or kV according to patient size and/or use of iterative reconstruction technique. COMPARISON:  July 02, 2021 FINDINGS: Brain: There is moderate severity cerebral atrophy with widening of the extra-axial spaces and ventricular dilatation. There are areas of decreased attenuation within the white matter tracts of the supratentorial brain, consistent with microvascular disease changes. A chronic left frontal parietal infarct is seen. Vascular: No hyperdense vessel or unexpected calcification. Skull: Normal. Negative for fracture or focal lesion. Sinuses/Orbits: A large amount of heterogeneous, hyperdense material (approximately 63.18 Hounsfield units) is seen within the right globe. This represents a new finding when compared to the prior study. Other: None. IMPRESSION: 1. Large amount of hemorrhagic material within the right globe. This represents a new finding when compared to the prior study and may represent sequelae associated with retinal detachment. Ophthalmology consult is recommended. 2. Chronic left frontal parietal infarct. 3.  Generalized cerebral atrophy and chronic white matter small vessel ischemic changes, without evidence of an acute intracranial abnormality. Electronically Signed   By: TVirgina NorfolkM.D.   On: 08/09/2022 23:20   Color Fundus Photography Optos - OU - Both Eyes  Result Date: 08/08/2022 Right Eye Macula : retinal pigment epithelium abnormalities. Left Eye Progression has been stable. Disc findings include normal observations. Macula : drusen, geographic atrophy, retinal pigment epithelium abnormalities. Notes No view OS, stable   Procedures Procedures  Medications Ordered in ED Medications  HYDROmorphone (DILAUDID) injection 0.5 mg (has no administration in time range)  iohexol (OMNIPAQUE) 300 MG/ML solution 75 mL (75 mLs Intravenous Contrast Given 08/09/22 2302)  oxyCODONE-acetaminophen (PERCOCET/ROXICET) 5-325 MG per tablet 1 tablet (1 tablet Oral Given 08/10/22 0203)    ED Course/ Medical Decision Making/ A&P                           Medical Decision Making Amount and/or Complexity of Data Reviewed Labs: ordered.  Risk Prescription drug management.  Patient presents with right eye pain.  Reviewed ophthalmology notes and urgent care notes.  Has hemorrhage.  Has been on drops without adequate pain relief.  Has also been on oral narcotics.  Unable to manage due to the pain.  Had plans for outpatient follow-up with oculoplastics but vision is not considered salvageable.  Has had some oxycodone in the past.  Patient's son states he began to take it more frequently when he had back surgery but is not currently on oxycodone.  Is on tramadol.  CT scan done and independently interpreted and shows intraocular hemorrhage.  This was previously known.  However has been given oral narcotics without adequate pain relief.  I feel that he would benefit from admission to the hospital for more pain control.  Potentially more ophthalmology consult but does not have to be done emergently.  Has indwelling  catheter.  Urine does not necessarily show infection.  White count is reassuring.  Culture sent but will not empirically treat at this time.  Is already on Macrobid.         Final Clinical Impression(s) / ED Diagnoses Final diagnoses:  Intractable pain  Choroid hemorrhage, right    Rx / DC Orders ED Discharge Orders     None         Davonna Belling, MD 08/10/22 [phone]

## 2022-08-10 NOTE — ED Notes (Signed)
ED TO INPATIENT HANDOFF REPORT   S Name/Age/Gender Douglas Walker 88 y.o. male Room/Bed: 002C/002C  Code Status   Code Status: DNR  Home/SNF/Other Home Patient oriented to: self, place, time, and situation Is this baseline? Yes   Triage Complete: Triage complete  Chief Complaint Intractable pain [R52] Eye pain, right [H57.11]  Triage Note Patient reports persistent right eye pain/headache onset this week , seen by ophthalmologist yesterday prescribed with eye drops with no relief. Family also requesting urine test for UTI .    Allergies Allergies  Allergen Reactions   Sulfa Antibiotics Other (See Comments)    Weakness, dizziness Other reaction(s): has caused increased sweating in the past   Primidone Other (See Comments)    Other reaction(s): increased sedation Other reaction(s): increased sedation   Simvastatin Other (See Comments)    Other reaction(s): arthralgias and fatigue, severe Other reaction(s): arthralgias and fatigue, severe    Level of Care/Admitting Diagnosis ED Disposition     ED Disposition  Admit   Condition  --   [place]: [place] [[id number]]  Level of Care: Med-Surg [16]  May admit patient to [facility] or [facility] if equivalent level of care is available:: No  Covid Evaluation: Asymptomatic - no recent exposure (last 10 days) testing not required  Diagnosis: Eye pain, right [[id number]]  Admitting Physician: Antonieta Pert [[id number]]  Attending Physician: Antonieta Pert [[id number]]  Certification:: I certify this patient will need inpatient services for at least 2 midnights  Estimated Length of Stay: 2          B Medical/Surgery History Past Medical History:  Diagnosis Date   Arthritis    shoulders and back   Ascending aorta dilatation ([place])    Cancer ([place])    skin cancers   Chronic diastolic (congestive) heart failure (HCC)    Chronic low back pain 12/15/2017   CKD (chronic kidney disease) stage 3,  GFR 30-59 ml/min (HCC)    Dysrhythmia     Paroxysmal atrial fibrillation; Tachycardia-bradycardia syndrome   Essential tremor 02/28/2016   GERD (gastroesophageal reflux disease)    Hernia    History of hiatal hernia    Hyperlipemia    Hypertension    Macular degeneration    Neuromuscular disorder (HCC)    neuropathy   Paroxysmal atrial fibrillation ([place]) 10/28/2013   Presence of permanent cardiac pacemaker 12/04/2018   Pulmonary hypertension ([place])    Stroke ([place]) 12/2020   Tachycardia-bradycardia syndrome ([place]) 09/13/2014   TIA (transient ischemic attack)    Past Surgical History:  Procedure Laterality Date   [place] / REPLACE / REMOVE PACEMAKER  12/04/2018   IRRIGATION AND DEBRIDEMENT ABSCESS Right 07/24/2020   Procedure: IRRIGATION AND DEBRIDEMENT HEMATOMA;  Surgeon: Coralie Keens, MD;  Location: [place];  Service: General;  Laterality: Right;   MASS EXCISION Right 07/20/2020   Procedure: EXCISION OF RIGHT CHEST WALL MASS;  Surgeon: Stark Klein, MD;  Location: [place];  Service: General;  Laterality: Right;   PACEMAKER IMPLANT N/A 12/04/2018   Procedure: PACEMAKER IMPLANT;  Surgeon: Evans Lance, MD;  Location: [facility];  Service: Cardiovascular;  Laterality: N/A;     A IV Location/Drains/Wounds Patient Lines/Drains/Airways Status     Active Line/Drains/Airways     Name Placement date Placement time Site Days   Peripheral IV 10/09/21 20 G Anterior;Right  Forearm 10/09/21  0049  Forearm  305   Peripheral IV 08/09/22 20 G 1" Right Antecubital 08/09/22  2245  Antecubital  1   Urethral Catheter Double-lumen 10/08/21  2140  Double-lumen  306   Incision (Closed) 12/04/18 Chest Left 12/04/18  1200  -- 1345   Incision (Closed) 07/20/20 Back Right 07/20/20  1044  -- 751   Incision (Closed) 07/24/20 Back Right 07/24/20  1556  -- 747            Intake/Output Last 24 hours  Intake/Output  Summary (Last 24 hours) at 08/10/2022 1119 Last data filed at 08/10/2022 0245 Gross per 24 hour  Intake --  Output 600 ml  Net -600 ml    Labs/Imaging Results for orders placed or performed during the hospital encounter of 08/09/22 (from the past 48 hour(s))  Basic metabolic panel     Status: Abnormal   Collection Time: 08/09/22  8:55 PM  Result Value Ref Range   Sodium 134 (L) 135 - 145 mmol/L   Potassium 3.9 3.5 - 5.1 mmol/L   Chloride 97 (L) 98 - 111 mmol/L   CO2 27 22 - 32 mmol/L   Glucose, Bld 108 (H) 70 - 99 mg/dL    Comment: Glucose reference range applies only to samples taken after fasting for at least 8 hours.   BUN 22 8 - 23 mg/dL   Creatinine, Ser 1.29 (H) 0.61 - 1.24 mg/dL   Calcium 9.4 8.9 - 10.3 mg/dL   GFR, Estimated 51 (L) >60 mL/min    Comment: (NOTE) Calculated using the CKD-EPI Creatinine Equation (2021)    Anion gap 10 5 - 15    Comment: Performed at [facility] [address], [place], [place] [address]  CBC     Status: Abnormal   Collection Time: 08/09/22  8:55 PM  Result Value Ref Range   WBC 8.9 4.0 - 10.5 K/uL   RBC 3.39 (L) 4.22 - 5.81 MIL/uL   Hemoglobin 11.2 (L) 13.0 - 17.0 g/dL   HCT 32.9 (L) 39.0 - 52.0 %   MCV 97.1 80.0 - 100.0 fL   MCH 33.0 26.0 - 34.0 pg   MCHC 34.0 30.0 - 36.0 g/dL   RDW 14.6 11.5 - 15.5 %   Platelets 93 (L) 150 - 400 K/uL    Comment: Immature Platelet Fraction may be clinically indicated, consider ordering this additional test [id number] REPEATED TO VERIFY    nRBC 0.0 0.0 - 0.2 %    Comment: Performed at [facility], [place] [address] [place], [place] [address]  Urinalysis, Routine w reflex microscopic Urine, Catheterized     Status: Abnormal   Collection Time: 08/09/22  9:15 PM  Result Value Ref Range   Color, Urine STRAW (A) YELLOW   APPearance CLEAR CLEAR   Specific Gravity, Urine 1.005 1.005 - 1.030   pH 7.0 5.0 - 8.0   Glucose, UA NEGATIVE NEGATIVE mg/dL   Hgb urine dipstick LARGE (A) NEGATIVE    Bilirubin Urine NEGATIVE NEGATIVE   Ketones, ur NEGATIVE NEGATIVE mg/dL   Protein, ur NEGATIVE NEGATIVE mg/dL   Nitrite POSITIVE (A) NEGATIVE   Leukocytes,Ua MODERATE (A) NEGATIVE   RBC / HPF 21-50 0 - 5 RBC/hpf   WBC, UA 6-10 0 - 5 WBC/hpf   Bacteria, UA NONE SEEN NONE SEEN   Squamous Epithelial / LPF 0-5 0 - 5    Comment: Performed at [facility] [address], [place], [place]  [address]  Basic metabolic panel     Status: Abnormal   Collection Time: 08/10/22  4:53 AM  Result Value Ref Range   Sodium 134 (L) 135 - 145 mmol/L   Potassium 4.0 3.5 - 5.1 mmol/L   Chloride 98 98 - 111 mmol/L   CO2 25 22 - 32 mmol/L   Glucose, Bld 107 (H) 70 - 99 mg/dL    Comment: Glucose reference range applies only to samples taken after fasting for at least 8 hours.   BUN 20 8 - 23 mg/dL   Creatinine, Ser 1.27 (H) 0.61 - 1.24 mg/dL   Calcium 9.0 8.9 - 10.3 mg/dL   GFR, Estimated 52 (L) >60 mL/min    Comment: (NOTE) Calculated using the CKD-EPI Creatinine Equation (2021)    Anion gap 11 5 - 15    Comment: Performed at [facility] [address] [place], [place] [address]  CBC     Status: Abnormal   Collection Time: 08/10/22  4:53 AM  Result Value Ref Range   WBC 8.6 4.0 - 10.5 K/uL   RBC 3.34 (L) 4.22 - 5.81 MIL/uL   Hemoglobin 11.2 (L) 13.0 - 17.0 g/dL   HCT 32.0 (L) 39.0 - 52.0 %   MCV 95.8 80.0 - 100.0 fL   MCH 33.5 26.0 - 34.0 pg   MCHC 35.0 30.0 - 36.0 g/dL   RDW 14.6 11.5 - 15.5 %   Platelets 81 (L) 150 - 400 K/uL    Comment: Immature Platelet Fraction may be clinically indicated, consider ordering this additional test [id number] REPEATED TO VERIFY    nRBC 0.0 0.0 - 0.2 %    Comment: Performed at [facility], [place] [address], [place], [place] [address]  Protime-INR     Status: Abnormal   Collection Time: 08/10/22  4:53 AM  Result Value Ref Range   Prothrombin Time 26.0 (H) 11.4 - 15.2 seconds   INR 2.4 (H) 0.8 - 1.2    Comment: (NOTE) INR goal varies  based on device and disease states. Performed at [facility], [place] [address], [place], [place] [address]    CT Orbits W Contrast  Result Date: 08/09/2022 CLINICAL DATA:  Retinal disorder. EXAM: CT ORBITS WITH CONTRAST TECHNIQUE: Multidetector CT images was performed according to the standard protocol following intravenous contrast administration. RADIATION DOSE REDUCTION: This exam was performed according to the departmental dose-optimization program which includes automated exposure control, adjustment of the mA and/or kV according to patient size and/or use of iterative reconstruction technique. CONTRAST:  83m OMNIPAQUE IOHEXOL 300 MG/ML  SOLN COMPARISON:  None Available. FINDINGS: Orbits: A large amount heterogeneous increased attenuation (approximately 90.04 Hounsfield units) is seen within the right globe. The left globe is unremarkable. No evidence of orbital inflammation. Normal appearance of the optic nerve-sheath complexes, extraocular muscles, orbital fat and lacrimal glands. Visible paranasal sinuses: Clear. Soft tissues: Normal. Osseous: No fracture or aggressive lesion. Limited intracranial: No acute or significant finding. IMPRESSION: Large amount of hemorrhagic material within the right globe. Correlation with ophthalmologic examination is recommended. Electronically Signed   By: TVirgina NorfolkM.D.   On: 08/09/2022 23:25   CT Head Wo Contrast  Result Date: 08/09/2022 CLINICAL DATA:  Headache. EXAM: CT HEAD WITHOUT CONTRAST TECHNIQUE: Contiguous axial images were obtained from the base of the skull through the vertex without intravenous contrast. RADIATION DOSE REDUCTION: This exam was performed according to the departmental dose-optimization program which includes automated exposure control, adjustment of  the mA and/or kV according to patient size and/or use of iterative reconstruction technique. COMPARISON:  July 02, 2021 FINDINGS: Brain: There is moderate severity cerebral  atrophy with widening of the extra-axial spaces and ventricular dilatation. There are areas of decreased attenuation within the white matter tracts of the supratentorial brain, consistent with microvascular disease changes. A chronic left frontal parietal infarct is seen. Vascular: No hyperdense vessel or unexpected calcification. Skull: Normal. Negative for fracture or focal lesion. Sinuses/Orbits: A large amount of heterogeneous, hyperdense material (approximately 63.18 Hounsfield units) is seen within the right globe. This represents a new finding when compared to the prior study. Other: None. IMPRESSION: 1. Large amount of hemorrhagic material within the right globe. This represents a new finding when compared to the prior study and may represent sequelae associated with retinal detachment. Ophthalmology consult is recommended. 2. Chronic left frontal parietal infarct. 3. Generalized cerebral atrophy and chronic white matter small vessel ischemic changes, without evidence of an acute intracranial abnormality. Electronically Signed   By: Virgina Norfolk M.D.   On: 08/09/2022 23:20    Pending Labs Unresulted Labs (From admission, onward)     Start     Ordered   08/10/22 7628  Basic metabolic panel  Daily at 5am,   R      08/10/22 0337   08/10/22 0500  CBC  Daily at 5am,   R      08/10/22 0337   08/10/22 0144  Urine Culture  Once,   URGENT       Question:  Indication  Answer:  Urgency/frequency   08/10/22 0143            Vitals/Pain Today's Vitals   08/10/22 0800 08/10/22 0815 08/10/22 0830 08/10/22 0840  BP: (!) 175/84 (!) 173/86 (!) 175/85 (!) 175/85  Pulse: 69 70 70 69  Resp:    16  Temp:    97.8 F (36.6 C)  TempSrc:    Oral  SpO2: 96% 90% 95% 95%  PainSc:        Isolation Precautions No active isolations  Medications Medications  nitrofurantoin (macrocrystal-monohydrate) (MACROBID) capsule 100 mg (has no administration in time range)  furosemide (LASIX) tablet 40 mg  (has no administration in time range)  DULoxetine (CYMBALTA) DR capsule 60 mg (has no administration in time range)  levothyroxine (SYNTHROID) tablet 25 mcg (25 mcg Oral Given 08/10/22 0719)  pregabalin (LYRICA) capsule 50 mg (has no administration in time range)  topiramate (TOPAMAX) tablet 50 mg (has no administration in time range)  atropine 1 % ophthalmic solution 1 drop (has no administration in time range)  brimonidine (ALPHAGAN) 0.2 % ophthalmic solution 1 drop (has no administration in time range)  prednisoLONE acetate (PRED FORTE) 1 % ophthalmic suspension 1 drop (has no administration in time range)  timolol (TIMOPTIC) 0.5 % ophthalmic solution 1 drop (has no administration in time range)  acetaminophen (TYLENOL) tablet 650 mg (650 mg Oral Given 08/10/22 0448)  oxyCODONE (Oxy IR/ROXICODONE) immediate release tablet 5 mg (has no administration in time range)  HYDROmorphone (DILAUDID) injection 0.5 mg (0.5 mg Intravenous Given 08/10/22 0722)  polyethylene glycol (MIRALAX / GLYCOLAX) packet 17 g (has no administration in time range)  hydrALAZINE (APRESOLINE) tablet 25 mg (has no administration in time range)  hydrALAZINE (APRESOLINE) tablet 25 mg (has no administration in time range)  iohexol (OMNIPAQUE) 300 MG/ML solution 75 mL (75 mLs Intravenous Contrast Given 08/09/22 2302)  oxyCODONE-acetaminophen (PERCOCET/ROXICET) 5-325 MG per tablet 1 tablet (1 tablet Oral  Given 08/10/22 0203)  HYDROmorphone (DILAUDID) injection 0.5 mg (0.5 mg Intravenous Given 08/10/22 0242)    Mobility walks with device Low fall risk   F R Recommendations: See Admitting Provider Note  Report given to:

## 2022-08-10 NOTE — Plan of Care (Signed)

## 2022-08-10 NOTE — Progress Notes (Addendum)
PROGRESS NOTE Douglas Walker  JKD:[id number] DOB: 1925-12-17 DOA: 08/09/2022 PCP: Josetta Huddle, MD   Brief Narrative/Hospital Course: 88 y.o. male with medical history significant for CKD 3a, tremor, chronic diastolic CHF, PAF on warfarin, chronic Foley catheter, recurrent UTI, and choroidal hemorrhage on the right presented to the ED with severe right eye pain ongoing 2 to 3 weeks,has been following with ophthalmology and found to have choroidal hemorrhage, per ophthalmology, the vision is not salvageable, warfarin has been continued, and he is being treated with topical atropine, Pred forte, and brimonidine, po tramadol at home but is unable to sleep due to the pain.     ED Course: Upon arrival to the ED, patient is found to be afebrile and hypertensive.  Chemistry panel with creatinine 1.29.  CBC with chronic stable thrombocytopenia.  CT of the head and orbits notable for hemorrhagic material in the right globe.  Patient was given Percocet in the ED, continued to have pain, and was given IV Dilaudid.  Patient admitted for intractable pain hypertensive urgency    Subjective: Seen and examined this morning.  Complains of ongoing right eye pain-reports a good relief with IV Dilaudid.   Assessment and Plan: Principal Problem:   Intractable pain Active Problems:   Thrombocytopenia (HCC)   Paroxysmal atrial fibrillation (HCC)   Essential hypertension   Chronic diastolic CHF (congestive heart failure) (HCC)   Stage 3a chronic kidney disease (CKD) (HCC)   Choroidal hemorrhage, right   Eye pain, right   Intractable pain on rt eye with known choroidal hemorrhage-Blind painful right eye Secondary elevation of intraocular pressure: Ongoing 2-3 weeks,has been following with ophthalmology and found to have choroidal hemorrhage, per ophthalmology Dr Zadie Rhine, the vision is not salvageable, warfarin has been continued, and he is being treated with topical atropine, Pred forte, and brimonidine, po  tramadol.  Continue pain control. Will obtain Ophthalmology consulted Dr Tobe Sos- he advised ongoing pain control, would recommend current eyedrop, can consider acetazolamide eventually may need enucleation-await further ophthalmology recommendations.  On IV Dilaudid and oral oxy for pain control.  "He was referred to oculoplastics for opinion regarding possible temporizing techniques-retrobulbar alcohol injection and/or evisceration"  Thrombocytopenia:Platelet 80s to 93k,monitor.  KDX:IPJA controlled on anticoagulation with Coumadin pharmacy consulted.  Felt to be safe to continue per outpatient ophthalmology. INR 2.4.He is followed by Dr. Lovena Le and Dr. Tamala Julian from cardiology as OP.  Essential hypertension W/ hypertensive urgency: BP in 180s.  Likely due to pain continue pain control continue Lasix, prn meds. Add hydralazine 25 mg po S5KN  Chronic diastolic CHF: Continue home Lasix  Stage 3a chronic kidney disease: At baseline  I spent 20 minutes in the care of this patient excluding procedures  DVT prophylaxis:   On Coumadin Code Status:   Code Status: DNR Family Communication: plan of care discussed with patient/no family at bedside. I called and updated son and is aware about the plan.  Patient status is: Admitted as observation but remains hospitalized for ongoing pain control and will change to inpatient.   Level of care: Med-Surg  Dispo: The patient is from: home            Anticipated disposition: TBD 2-3 days Mobility Assessment (last 72 hours)     Mobility Assessment   No documentation.          Objective: Vitals last 24 hrs: Vitals:   08/10/22 0800 08/10/22 0815 08/10/22 0830 08/10/22 0840  BP: (!) 175/84 (!) 173/86 (!) 175/85 (!) 175/85  Pulse:  69 70 70 69  Resp:    16  Temp:    97.8 F (36.6 C)  TempSrc:    Oral  SpO2: 96% 90% 95% 95%   Weight change:   Physical Examination: General exam: alert awake,older than stated age, weak appearing. HEENT:Oral  mucosa moist, Ear/Nose WNL grossly, dentition normal. Respiratory system: bilaterally diminished BS, no use of accessory muscle Cardiovascular system: S1 & S2 +, No JVD. Gastrointestinal system: Abdomen soft,NT,ND, BS+ Nervous System:Alert, awake, moving extremities and grossly nonfocal,right thigh closed with unable to see. Extremities: LE edema neg,distal peripheral pulses palpable.  Skin: No rashes,no icterus. MSK: Normal muscle bulk,tone, power  Medications reviewed:  Scheduled Meds:  acetaminophen  650 mg Oral Q6H   atropine  1 drop Right Eye BID   brimonidine  1 drop Right Eye TID   DULoxetine  60 mg Oral Daily   furosemide  40 mg Oral Daily   hydrALAZINE  25 mg Oral Q8H   levothyroxine  25 mcg Oral QAC breakfast   nitrofurantoin (macrocrystal-monohydrate)  100 mg Oral Daily   prednisoLONE acetate  1 drop Right Eye BID   pregabalin  50 mg Oral QHS   timolol  1 drop Right Eye BID   topiramate  50 mg Oral BID   Continuous Infusions:    Diet Order             Diet regular Room service appropriate? Yes; Fluid consistency: Thin  Diet effective now                     Intake/Output Summary (Last 24 hours) at 08/10/2022 1117 Last data filed at 08/10/2022 0245 Gross per 24 hour  Intake --  Output 600 ml  Net -600 ml   Net IO Since Admission: -600 mL [08/10/22 1117]  Wt Readings from Last 3 Encounters:  08/02/22 89.4 kg  07/24/22 90.3 kg  05/09/22 90.7 kg     Unresulted Labs (From admission, onward)     Start     Ordered   08/10/22 1540  Basic metabolic panel  Daily at 5am,   R      08/10/22 0337   08/10/22 0500  CBC  Daily at 5am,   R      08/10/22 0337   08/10/22 0144  Urine Culture  Once,   URGENT       Question:  Indication  Answer:  Urgency/frequency   08/10/22 0143          Data Reviewed: I have personally reviewed following labs and imaging studies CBC: Recent Labs  Lab 08/09/22 2055 08/10/22 0453  WBC 8.9 8.6  HGB 11.2* 11.2*  HCT 32.9*  32.0*  MCV 97.1 95.8  PLT 93* 81*   Basic Metabolic Panel: Recent Labs  Lab 08/09/22 2055 08/10/22 0453  NA 134* 134*  K 3.9 4.0  CL 97* 98  CO2 27 25  GLUCOSE 108* 107*  BUN 22 20  CREATININE 1.29* 1.27*  CALCIUM 9.4 9.0   GFR: Estimated Creatinine Clearance: 39.3 mL/min (A) (by C-G formula based on SCr of 1.27 mg/dL (H)). Liver Function Tests: No results for input(s): "AST", "ALT", "ALKPHOS", "BILITOT", "PROT", "ALBUMIN" in the last 168 hours. No results for input(s): "LIPASE", "AMYLASE" in the last 168 hours. No results for input(s): "AMMONIA" in the last 168 hours. Coagulation Profile: Recent Labs  Lab 08/10/22 0453  INR 2.4*   BNP (last 3 results) No results for input(s): "PROBNP" in the last 8760  hours. HbA1C: No results for input(s): "HGBA1C" in the last 72 hours. CBG: No results for input(s): "GLUCAP" in the last 168 hours. Lipid Profile: No results for input(s): "CHOL", "HDL", "LDLCALC", "TRIG", "CHOLHDL", "LDLDIRECT" in the last 72 hours. Thyroid Function Tests: No results for input(s): "TSH", "T4TOTAL", "FREET4", "T3FREE", "THYROIDAB" in the last 72 hours. Sepsis Labs: No results for input(s): "PROCALCITON", "LATICACIDVEN" in the last 168 hours.  No results found for this or any previous visit (from the past 240 hour(s)).  Antimicrobials: Anti-infectives (From admission, onward)    Start     Dose/Rate Route Frequency Ordered Stop   08/10/22 1000  nitrofurantoin (macrocrystal-monohydrate) (MACROBID) capsule 100 mg        100 mg Oral Daily 08/10/22 0337        Culture/Microbiology    Component Value Date/Time   SDES  10/08/2021 2222    IN/OUT CATH URINE Performed at [facility], [place] [address], [place], [place] [address]    SPECREQUEST  10/08/2021 2222    NONE Performed at [facility], [place] [address], [place], [place] [address]    CULT 90,000 COLONIES/mL PROTEUS MIRABILIS (A) 10/08/2021 2222   REPTSTATUS  10/10/2021 FINAL 10/08/2021 2222    Other culture-see note Radiology Studies: CT Orbits W Contrast  Result Date: 08/09/2022 CLINICAL DATA:  Retinal disorder. EXAM: CT ORBITS WITH CONTRAST TECHNIQUE: Multidetector CT images was performed according to the standard protocol following intravenous contrast administration. RADIATION DOSE REDUCTION: This exam was performed according to the departmental dose-optimization program which includes automated exposure control, adjustment of the mA and/or kV according to patient size and/or use of iterative reconstruction technique. CONTRAST:  49m OMNIPAQUE IOHEXOL 300 MG/ML  SOLN COMPARISON:  None Available. FINDINGS: Orbits: A large amount heterogeneous increased attenuation (approximately 90.04 Hounsfield units) is seen within the right globe. The left globe is unremarkable. No evidence of orbital inflammation. Normal appearance of the optic nerve-sheath complexes, extraocular muscles, orbital fat and lacrimal glands. Visible paranasal sinuses: Clear. Soft tissues: Normal. Osseous: No fracture or aggressive lesion. Limited intracranial: No acute or significant finding. IMPRESSION: Large amount of hemorrhagic material within the right globe. Correlation with ophthalmologic examination is recommended. Electronically Signed   By: TVirgina NorfolkM.D.   On: 08/09/2022 23:25   CT Head Wo Contrast  Result Date: 08/09/2022 CLINICAL DATA:  Headache. EXAM: CT HEAD WITHOUT CONTRAST TECHNIQUE: Contiguous axial images were obtained from the base of the skull through the vertex without intravenous contrast. RADIATION DOSE REDUCTION: This exam was performed according to the departmental dose-optimization program which includes automated exposure control, adjustment of the mA and/or kV according to patient size and/or use of iterative reconstruction technique. COMPARISON:  July 02, 2021 FINDINGS: Brain: There is moderate severity cerebral atrophy with widening of the extra-axial  spaces and ventricular dilatation. There are areas of decreased attenuation within the white matter tracts of the supratentorial brain, consistent with microvascular disease changes. A chronic left frontal parietal infarct is seen. Vascular: No hyperdense vessel or unexpected calcification. Skull: Normal. Negative for fracture or focal lesion. Sinuses/Orbits: A large amount of heterogeneous, hyperdense material (approximately 63.18 Hounsfield units) is seen within the right globe. This represents a new finding when compared to the prior study. Other: None. IMPRESSION: 1. Large amount of hemorrhagic material within the right globe. This represents a new finding when compared to the prior study and may represent sequelae associated with retinal detachment. Ophthalmology consult is recommended. 2. Chronic left frontal parietal infarct. 3. Generalized  cerebral atrophy and chronic white matter small vessel ischemic changes, without evidence of an acute intracranial abnormality. Electronically Signed   By: Virgina Norfolk M.D.   On: 08/09/2022 23:20     LOS: 0 days   Antonieta Pert, MD [facility]  08/10/2022, 11:17 AM

## 2022-08-10 NOTE — Consult Note (Signed)
CC:  Chief Complaint  Patient presents with   Right Eye Pain / UTI     HPI: Douglas Walker is a 88 y.o. male w/ POH of NVAMD OU, hx PKP OD and PMH below who presents for evaluation of right eye pain. Symptoms started two weeks ago.  Pt is established patient of Dr. Zadie Rhine and hx of inactive AMD OU.  Pt taking Coumadin.  A few weeks ago noted onset of spontaneous choroidal hemorrhage.  Dr. Zadie Rhine has determined the vision is not salvageable, and goals of care were transitioned to comfort.  Pt presented to ED due to intractable pain in OD.   ROS: Negative except as otherwise stated.  PMH: Past Medical History:  Diagnosis Date   Arthritis    shoulders and back   Ascending aorta dilatation (HCC)    Cancer (HCC)    skin cancers   Chronic diastolic (congestive) heart failure (HCC)    Chronic low back pain 12/15/2017   CKD (chronic kidney disease) stage 3, GFR 30-59 ml/min (HCC)    Dysrhythmia     Paroxysmal atrial fibrillation; Tachycardia-bradycardia syndrome   Essential tremor 02/28/2016   GERD (gastroesophageal reflux disease)    Hernia    History of hiatal hernia    Hyperlipemia    Hypertension    Macular degeneration    Neuromuscular disorder (HCC)    neuropathy   Paroxysmal atrial fibrillation ([place]) 10/28/2013   Presence of permanent cardiac pacemaker 12/04/2018   Pulmonary hypertension ([place])    Stroke ([place]) 12/2020   Tachycardia-bradycardia syndrome ([place]) 09/13/2014   TIA (transient ischemic attack)     PSH: Past Surgical History:  Procedure Laterality Date   [place] / REPLACE / REMOVE PACEMAKER  12/04/2018   IRRIGATION AND DEBRIDEMENT ABSCESS Right 07/24/2020   Procedure: IRRIGATION AND DEBRIDEMENT HEMATOMA;  Surgeon: Coralie Keens, MD;  Location: [facility];  Service: General;  Laterality: Right;   MASS EXCISION Right 07/20/2020   Procedure: EXCISION OF RIGHT CHEST WALL MASS;  Surgeon: Stark Klein, MD;  Location: [facility];  Service: General;  Laterality: Right;   PACEMAKER IMPLANT N/A 12/04/2018   Procedure: PACEMAKER IMPLANT;  Surgeon: Evans Lance, MD;  Location: [facility];  Service: Cardiovascular;  Laterality: N/A;    Meds: No current facility-administered medications on file prior to encounter.   Current Outpatient Medications on File Prior to Encounter  Medication Sig Dispense Refill   acetaminophen (TYLENOL) 325 MG tablet Take 2 tablets (650 mg total) by mouth every 4 (four) hours as needed for mild pain (or temp > 37.5 C (99.5 F)).     Ascorbic Acid (VITAMIN C) 1000 MG tablet Take 1,000 mg by mouth daily.     atropine 1 % ophthalmic solution Place 1 drop into the right eye 2 (two) times daily. 2 mL 12   brimonidine (ALPHAGAN) 0.2 % ophthalmic solution Place 1 drop into the right eye every 8 (eight) hours. 5 mL 1   brimonidine (ALPHAGAN) 0.2 % ophthalmic solution 1 drop into affected eye     DULoxetine (CYMBALTA) 60 MG capsule Take 1 capsule (60 mg total) by mouth daily. 30 capsule 11   furosemide (LASIX) 20 MG tablet Take 2 tablets (40 mg total) by mouth daily. 60 tablet 0   levothyroxine (SYNTHROID) 25 MCG tablet Take 1 tablet (25 mcg total) by mouth daily  before breakfast. 90 tablet 1   loratadine (CLARITIN) 10 MG tablet Take 10 mg by mouth daily.     Multiple Vitamin (MULTIVITAMIN WITH MINERALS) TABS tablet Take 1 tablet by mouth daily.     Multiple Vitamins-Minerals (OCUVITE ADULT 50+) CAPS Take 1 capsule by mouth daily.     nitrofurantoin, macrocrystal-monohydrate, (MACROBID) 100 MG capsule Take 100 mg by mouth daily.     phenazopyridine (PYRIDIUM) 200 MG tablet Take 200 mg by mouth See admin instructions. '200mg'$  oral at night And '200mg'$  extra twice daily as needed for bladder spasms     polyethylene glycol (MIRALAX / GLYCOLAX) 17 g packet Take 17 g by mouth daily. (Patient taking differently: Take 17 g by mouth daily as needed for mild constipation.) 30 each 0    prednisoLONE acetate (PRED FORTE) 1 % ophthalmic suspension Place 1 drop into the right eye 2 (two) times daily. 10 mL 3   prednisoLONE acetate (PRED FORTE) 1 % ophthalmic suspension 1 drop into affected eye     pregabalin (LYRICA) 25 MG capsule Take 2 capsules (50 mg total) by mouth at bedtime. TAKE 1 CAPSULE(25 MG) BY MOUTH AT BEDTIME 180 capsule 1   primidone (MYSOLINE) 250 MG tablet Take 1 tablet (250 mg total) by mouth 2 (two) times daily. (Patient taking differently: Take 250 mg by mouth 2 (two) times daily. Taking '100mg'$ ) 60 tablet 11   timolol (TIMOPTIC) 0.5 % ophthalmic solution Place 1 drop into the right eye 2 (two) times daily. 5 mL 1   timolol (TIMOPTIC) 0.5 % ophthalmic solution 1 drop into affected eye     topiramate (TOPAMAX) 50 MG tablet Take 1 tablet (50 mg total) by mouth 2 (two) times daily. 180 tablet 0   traMADol (ULTRAM) 50 MG tablet Take 50-100 mg by mouth 3 (three) times daily as needed.     vitamin B-12 (CYANOCOBALAMIN) 1000 MCG tablet Take 1 tablet (1,000 mcg total) by mouth every other day. (Patient taking differently: Take 1,000 mcg by mouth See admin instructions. 105mg oral daily on Sunday, Tuesday, Wednesday, Thursday, repeat.) 30 tablet 0   warfarin (COUMADIN) 5 MG tablet TAKE 1 AND 1/2 TO 2 TABLETS BY MOUTH ONCE DAILY OR AS DIRECTED BY COUMADIN CLINIC 100 tablet 0    SH: Social History   Socioeconomic History   Marital status: Widowed    Spouse name: Not on file   Number of children: 1   Years of education: 10   Highest education level: Not on file  Occupational History    Comment: retired  Tobacco Use   Smoking status: Former    Types: Cigarettes    Quit date: 12/30/1962    Years since quitting: 59.6   Smokeless tobacco: Never  Vaping Use   Vaping Use: Never used  Substance and Sexual Activity   Alcohol use: No   Drug use: No   Sexual activity: Not Currently  Other Topics Concern   Not on file  Social History Narrative   Lives alone    Right-handed   Caffeine use- caffeine free coffee, occas soda   Social Determinants of Health   Financial Resource Strain: Not on file  Food Insecurity: Not on file  Transportation Needs: Not on file  Physical Activity: Not on file  Stress: Not on file  Social Connections: Not on file    FH: Family History  Problem Relation Age of Onset   GI problems Mother    Other Sister  PAIN ISSUES   Hearing loss Sister    Blindness Sister    Heart attack Neg Hx    Stroke Neg Hx       Last Eye Exam: 08/08/22   Primary Eye Care: Dr. Zadie Rhine   Past Medical History:  Diagnosis Date   Arthritis    shoulders and back   Ascending aorta dilatation (HCC)    Cancer (HCC)    skin cancers   Chronic diastolic (congestive) heart failure (HCC)    Chronic low back pain 12/15/2017   CKD (chronic kidney disease) stage 3, GFR 30-59 ml/min (HCC)    Dysrhythmia     Paroxysmal atrial fibrillation; Tachycardia-bradycardia syndrome   Essential tremor 02/28/2016   GERD (gastroesophageal reflux disease)    Hernia    History of hiatal hernia    Hyperlipemia    Hypertension    Macular degeneration    Neuromuscular disorder (HCC)    neuropathy   Paroxysmal atrial fibrillation ([place]) 10/28/2013   Presence of permanent cardiac pacemaker 12/04/2018   Pulmonary hypertension ([place])    Stroke ([place]) 12/2020   Tachycardia-bradycardia syndrome ([place]) 09/13/2014   TIA (transient ischemic attack)      Past Surgical History:  Procedure Laterality Date   [place] / REPLACE / REMOVE PACEMAKER  12/04/2018   IRRIGATION AND DEBRIDEMENT ABSCESS Right 07/24/2020   Procedure: IRRIGATION AND DEBRIDEMENT HEMATOMA;  Surgeon: Coralie Keens, MD;  Location: [facility];  Service: General;  Laterality: Right;   MASS EXCISION Right 07/20/2020   Procedure: EXCISION OF RIGHT CHEST WALL MASS;  Surgeon: Stark Klein, MD;  Location: [facility];  Service:  General;  Laterality: Right;   PACEMAKER IMPLANT N/A 12/04/2018   Procedure: PACEMAKER IMPLANT;  Surgeon: Evans Lance, MD;  Location: [facility];  Service: Cardiovascular;  Laterality: N/A;     Social History   Socioeconomic History   Marital status: Widowed    Spouse name: Not on file   Number of children: 1   Years of education: 10   Highest education level: Not on file  Occupational History    Comment: retired  Tobacco Use   Smoking status: Former    Types: Cigarettes    Quit date: 12/30/1962    Years since quitting: 59.6   Smokeless tobacco: Never  Vaping Use   Vaping Use: Never used  Substance and Sexual Activity   Alcohol use: No   Drug use: No   Sexual activity: Not Currently  Other Topics Concern   Not on file  Social History Narrative   Lives alone   Right-handed   Caffeine use- caffeine free coffee, occas soda   Social Determinants of Health   Financial Resource Strain: Not on file  Food Insecurity: Not on file  Transportation Needs: Not on file  Physical Activity: Not on file  Stress: Not on file  Social Connections: Not on file  Intimate Partner Violence: Not on file     Allergies  Allergen Reactions   Sulfa Antibiotics Other (See Comments)    Weakness, dizziness Other reaction(s): has caused increased sweating in the past   Primidone Other (See Comments)    Other reaction(s): increased sedation Other reaction(s): increased sedation   Simvastatin Other (See Comments)    Other reaction(s): arthralgias and fatigue, severe Other reaction(s): arthralgias and fatigue, severe     No current facility-administered medications  on file prior to encounter.   Current Outpatient Medications on File Prior to Encounter  Medication Sig Dispense Refill   acetaminophen (TYLENOL) 325 MG tablet Take 2 tablets (650 mg total) by mouth every 4 (four) hours as needed for mild pain (or temp > 37.5 C (99.5 F)).     Ascorbic Acid (VITAMIN C) 1000 MG tablet  Take 1,000 mg by mouth daily.     atropine 1 % ophthalmic solution Place 1 drop into the right eye 2 (two) times daily. 2 mL 12   brimonidine (ALPHAGAN) 0.2 % ophthalmic solution Place 1 drop into the right eye every 8 (eight) hours. 5 mL 1   brimonidine (ALPHAGAN) 0.2 % ophthalmic solution 1 drop into affected eye     DULoxetine (CYMBALTA) 60 MG capsule Take 1 capsule (60 mg total) by mouth daily. 30 capsule 11   furosemide (LASIX) 20 MG tablet Take 2 tablets (40 mg total) by mouth daily. 60 tablet 0   levothyroxine (SYNTHROID) 25 MCG tablet Take 1 tablet (25 mcg total) by mouth daily before breakfast. 90 tablet 1   loratadine (CLARITIN) 10 MG tablet Take 10 mg by mouth daily.     Multiple Vitamin (MULTIVITAMIN WITH MINERALS) TABS tablet Take 1 tablet by mouth daily.     Multiple Vitamins-Minerals (OCUVITE ADULT 50+) CAPS Take 1 capsule by mouth daily.     nitrofurantoin, macrocrystal-monohydrate, (MACROBID) 100 MG capsule Take 100 mg by mouth daily.     phenazopyridine (PYRIDIUM) 200 MG tablet Take 200 mg by mouth See admin instructions. '200mg'$  oral at night And '200mg'$  extra twice daily as needed for bladder spasms     polyethylene glycol (MIRALAX / GLYCOLAX) 17 g packet Take 17 g by mouth daily. (Patient taking differently: Take 17 g by mouth daily as needed for mild constipation.) 30 each 0   prednisoLONE acetate (PRED FORTE) 1 % ophthalmic suspension Place 1 drop into the right eye 2 (two) times daily. 10 mL 3   prednisoLONE acetate (PRED FORTE) 1 % ophthalmic suspension 1 drop into affected eye     pregabalin (LYRICA) 25 MG capsule Take 2 capsules (50 mg total) by mouth at bedtime. TAKE 1 CAPSULE(25 MG) BY MOUTH AT BEDTIME 180 capsule 1   primidone (MYSOLINE) 250 MG tablet Take 1 tablet (250 mg total) by mouth 2 (two) times daily. (Patient taking differently: Take 250 mg by mouth 2 (two) times daily. Taking '100mg'$ ) 60 tablet 11   timolol (TIMOPTIC) 0.5 % ophthalmic solution Place 1 drop into the  right eye 2 (two) times daily. 5 mL 1   timolol (TIMOPTIC) 0.5 % ophthalmic solution 1 drop into affected eye     topiramate (TOPAMAX) 50 MG tablet Take 1 tablet (50 mg total) by mouth 2 (two) times daily. 180 tablet 0   traMADol (ULTRAM) 50 MG tablet Take 50-100 mg by mouth 3 (three) times daily as needed.     vitamin B-12 (CYANOCOBALAMIN) 1000 MCG tablet Take 1 tablet (1,000 mcg total) by mouth every other day. (Patient taking differently: Take 1,000 mcg by mouth See admin instructions. 1011mg oral daily on Sunday, Tuesday, Wednesday, Thursday, repeat.) 30 tablet 0   warfarin (COUMADIN) 5 MG tablet TAKE 1 AND 1/2 TO 2 TABLETS BY MOUTH ONCE DAILY OR AS DIRECTED BY COUMADIN CLINIC 100 tablet 0     ROS    Exam:  General: Awake, Alert, Oriented?  Vision (near):   OD: NLP  OS: CF  Extraocular Motility:  Full ductions  and versions, both eyes  Pupils  OD: no view  OS: 70m to 144mreactive without afferent pupillary defect (APD)   IOP(tonopen)  OD: error (generally above 60)  OS: stp  Slit Lamp Exam:  Lids/Lashes  OD: Normal Lids and lashes, No lesion or injury  OS: Normal lids and lashes, nor lesion or injury  Conjucntiva/Sclera  OD: 1+ injection, chemosis  OS: White and quiet  Cornea  OD: Graft edematous  OS: Clear without abrasion or defect  Anterior Chamber  OD: ?formed  OS: Deep and quiet  Iris  OD: no view  OS: Normal Iris Architecture   Lens  OD: no view  OS: PCIOL  Anterior Vitreous  OD: no view   POSTERIOR POLE EXAM  View:   OD: no view    Vitreous:   OD: no view   Disc:   OD: no view   C:D Ratio:   OD: no view  Macula  OD: no view  Vessels  OD: no view  Periphery  OD: no view  08/09/22 CT Orbits w/ contrast IMPRESSION: Large amount of hemorrhagic material within the right globe. Correlation with ophthalmologic examination is recommended.  Assessment and Plan:   Choroidal Hemorrhage, right eye -Onset ~2 weeks ago; vision  was deemed 'not salvageable' by Dr. RaZadie RhineNow presenting with intractable pain requiring admission.  Notably, during my exam patient says he is 'much better' and was asleep when I entered the room. -IOP significantly elevated on exam which is likely etiology of pain -Agree with current regimen of atropine BID -Inc prednisolone to QID in right eye -Continue brimonidine TID right eye -Stop timolol; switch to dorzolamide 2%/timolol 0.5% and Rx BID in right eye -I agree that next step is palliative measures with oculoplastics.  Either retrobulbar alcohol or enucleation would be reasonable.  Generally this can occur as outpatient.  Ophthalmology will sign off.  Please call or reconsult if questions arise or clinical status changes.  NiRichmond CampbellMD Ophthalmology [facility]Gl[phone] Time Spent: 20 minutes

## 2022-08-10 NOTE — Hospital Course (Addendum)
88 y.o. male with medical history significant for CKD 3a, tremor, chronic diastolic CHF, PAF on warfarin, chronic Foley catheter, recurrent UTI, and choroidal hemorrhage on the right presented to the ED with severe right eye pain ongoing 2 to 3 weeks,has been following with ophthalmology and found to have choroidal hemorrhage, per ophthalmology, the vision is not salvageable, warfarin has been continued, and he is being treated with topical atropine, Pred forte, and brimonidine, po tramadol at home but is unable to sleep due to the pain.     ED Course: Upon arrival to the ED, patient is found to be afebrile and hypertensive.  Chemistry panel with creatinine 1.29.  CBC with chronic stable thrombocytopenia.  CT of the head and orbits notable for hemorrhagic material in the right globe.  Patient was given Percocet in the ED, continued to have pain, and was given IV Dilaudid.  Patient admitted for intractable pain hypertensive urgency

## 2022-08-10 NOTE — ED Notes (Signed)
Pt placed on 2L [place] for comfort, sats decrease after administration of IV dilaudid

## 2022-08-10 NOTE — Progress Notes (Signed)
[facility] for Warfarin  Indication: atrial fibrillation  Allergies  Allergen Reactions   Sulfa Antibiotics Other (See Comments)    Weakness, dizziness Other reaction(s): has caused increased sweating in the past   Primidone Other (See Comments)    Other reaction(s): increased sedation Other reaction(s): increased sedation   Simvastatin Other (See Comments)    Other reaction(s): arthralgias and fatigue, severe Other reaction(s): arthralgias and fatigue, severe   Vital Signs: Temp: 98.6 F (37 C) (08/12 1244) Temp Source: Oral (08/12 1244) BP: 175/81 (08/12 1244) Pulse Rate: 71 (08/12 1244)  Labs: Recent Labs    08/09/22 2055 08/10/22 0453  HGB 11.2* 11.2*  HCT 32.9* 32.0*  PLT 93* 81*  LABPROT  --  26.0*  INR  --  2.4*  CREATININE 1.29* 1.27*    Estimated Creatinine Clearance: 39.4 mL/min (A) (by C-G formula based on SCr of 1.27 mg/dL (H)).  Medical History: Past Medical History:  Diagnosis Date   Arthritis    shoulders and back   Ascending aorta dilatation (HCC)    Cancer (HCC)    skin cancers   Chronic diastolic (congestive) heart failure (HCC)    Chronic low back pain 12/15/2017   CKD (chronic kidney disease) stage 3, GFR 30-59 ml/min (HCC)    Dysrhythmia     Paroxysmal atrial fibrillation; Tachycardia-bradycardia syndrome   Essential tremor 02/28/2016   GERD (gastroesophageal reflux disease)    Hernia    History of hiatal hernia    Hyperlipemia    Hypertension    Macular degeneration    Neuromuscular disorder (HCC)    neuropathy   Paroxysmal atrial fibrillation ([place]) 10/28/2013   Presence of permanent cardiac pacemaker 12/04/2018   Pulmonary hypertension ([place])    Stroke ([place]) 12/2020   Tachycardia-bradycardia syndrome ([place]) 09/13/2014   TIA (transient ischemic attack)      Assessment: 88 y/o M admitted with right eye pain. Patient has a known choroidal hemorrhage of the right eye and warfarin therapy was  continued given risk-benefit. Pharmacy consulted for warfarin dosing. Outpatient warfarin regimen as of 07/09/22 is warfarin 7.5 mg daily except 10 mg on Sundays and Wednesdays.   INR today is within goal of 2-3 at 2.4 on home regimen. Hgb 11.2 stable, plt 81 and decreasing.  Goal of Therapy:  INR 2-3 Monitor platelets by anticoagulation protocol: Yes   Plan:  Give warfarin 7.5 mg x1 tonight. Daily INR and monitor CBC. Monitor for signs/symptoms of bleeding.  Jeneen Rinks, Pharm.D PGY1 Pharmacy Resident 08/10/2022 2:49 PM

## 2022-08-10 NOTE — ED Notes (Signed)
Breakfast order placed ?

## 2022-08-10 NOTE — Progress Notes (Signed)
ANTICOAGULATION CONSULT NOTE - Initial Consult  Pharmacy Consult for Warfarin  Indication: atrial fibrillation  Allergies  Allergen Reactions   Sulfa Antibiotics Other (See Comments)    Weakness, dizziness Other reaction(s): has caused increased sweating in the past   Primidone Other (See Comments)    Other reaction(s): increased sedation Other reaction(s): increased sedation   Simvastatin Other (See Comments)    Other reaction(s): arthralgias and fatigue, severe Other reaction(s): arthralgias and fatigue, severe     Vital Signs: Temp: 98.8 F (37.1 C) (08/12 0104) Temp Source: Oral (08/12 0104) BP: 180/86 (08/12 0230) Pulse Rate: 70 (08/12 0230)  Labs: Recent Labs    08/09/22 2055  HGB 11.2*  HCT 32.9*  PLT 93*  CREATININE 1.29*    Estimated Creatinine Clearance: 38.7 mL/min (A) (by C-G formula based on SCr of 1.29 mg/dL (H)).   Medical History: Past Medical History:  Diagnosis Date   Arthritis    shoulders and back   Ascending aorta dilatation (HCC)    Cancer (HCC)    skin cancers   Chronic diastolic (congestive) heart failure (HCC)    Chronic low back pain 12/15/2017   CKD (chronic kidney disease) stage 3, GFR 30-59 ml/min (HCC)    Dysrhythmia     Paroxysmal atrial fibrillation; Tachycardia-bradycardia syndrome   Essential tremor 02/28/2016   GERD (gastroesophageal reflux disease)    Hernia    History of hiatal hernia    Hyperlipemia    Hypertension    Macular degeneration    Neuromuscular disorder (HCC)    neuropathy   Paroxysmal atrial fibrillation ([place]) 10/28/2013   Presence of permanent cardiac pacemaker 12/04/2018   Pulmonary hypertension ([place])    Stroke ([place]) 12/2020   Tachycardia-bradycardia syndrome ([place]) 09/13/2014   TIA (transient ischemic attack)      Assessment: 88 y/o M with right eye pain. Has known choroidal hemorrhage of the right eye-looks like warfarin therapy was not held given risk-benefit. Continuing warfarin here. No INR  yet.   Goal of Therapy:  INR 2-3 Monitor platelets by anticoagulation protocol: Yes   Plan:  INR with AM labs to assess dosing needs  Narda Bonds, PharmD, BCPS Clinical Pharmacist Phone: [phone]

## 2022-08-10 NOTE — H&P (Signed)
History and Physical    Douglas Walker ION:[id number] DOB: [birth year] DOA: 08/09/2022  PCP: Douglas Huddle, MD   Patient coming from: Home   Chief Complaint: Eye pain   HPI: Douglas Walker is a pleasant 88 y.o. male with medical history significant for CKD 3A, tremor, chronic diastolic CHF, PAF on warfarin, chronic Foley catheter, recurrent UTI, and choroidal hemorrhage on the right, now presenting to the emergency department with severe right eye pain.  Patient has had right eye pain for 2 to 3 weeks, has been following with ophthalmology and found to have choroidal hemorrhage.  Per ophthalmology, the vision is not salvageable, warfarin has been continued, and he is being treated with topical atropine, Pred forte, and brimonidine.  Patient has also been taking tramadol at home but is unable to sleep due to the pain.    ED Course: Upon arrival to the ED, patient is found to be afebrile and hypertensive.  Chemistry panel with creatinine 1.29.  CBC with chronic stable thrombocytopenia.  CT of the head and orbits notable for hemorrhagic material in the right globe.  Patient was given Percocet in the ED, continued to have pain, and was given IV Dilaudid.  Review of Systems:  All other systems reviewed and apart from HPI, are negative.  Past Medical History:  Diagnosis Date   Arthritis    shoulders and back   Ascending aorta dilatation (HCC)    Cancer (HCC)    skin cancers   Chronic diastolic (congestive) heart failure (HCC)    Chronic low back pain 12/15/2017   CKD (chronic kidney disease) stage 3, GFR 30-59 ml/min (HCC)    Dysrhythmia     Paroxysmal atrial fibrillation; Tachycardia-bradycardia syndrome   Essential tremor 02/28/2016   GERD (gastroesophageal reflux disease)    Hernia    History of hiatal hernia    Hyperlipemia    Hypertension    Macular degeneration    Neuromuscular disorder (HCC)    neuropathy   Paroxysmal atrial fibrillation ([place]) 10/28/2013   Presence of  permanent cardiac pacemaker 12/04/2018   Pulmonary hypertension ([place])    Stroke ([place]) 12/2020   Tachycardia-bradycardia syndrome ([place]) 09/13/2014   TIA (transient ischemic attack)     Past Surgical History:  Procedure Laterality Date   [place] / REPLACE / REMOVE PACEMAKER  12/04/2018   IRRIGATION AND DEBRIDEMENT ABSCESS Right 07/24/2020   Procedure: IRRIGATION AND DEBRIDEMENT HEMATOMA;  Surgeon: Coralie Keens, MD;  Location: [facility];  Service: General;  Laterality: Right;   MASS EXCISION Right 07/20/2020   Procedure: EXCISION OF RIGHT CHEST WALL MASS;  Surgeon: Stark Klein, MD;  Location: [facility];  Service: General;  Laterality: Right;   PACEMAKER IMPLANT N/A 12/04/2018   Procedure: PACEMAKER IMPLANT;  Surgeon: Evans Lance, MD;  Location: [facility];  Service: Cardiovascular;  Laterality: N/A;    Social History:   reports that he quit smoking about 59 years ago. His smoking use included cigarettes. He has never used smokeless tobacco. He reports that he does not drink alcohol and does not use drugs.  Allergies  Allergen Reactions   Sulfa Antibiotics Other (See Comments)    Weakness, dizziness Other reaction(s): has caused increased sweating in the past   Primidone Other (See Comments)    Other reaction(s): increased sedation Other reaction(s): increased sedation   Simvastatin Other (See Comments)  Other reaction(s): arthralgias and fatigue, severe Other reaction(s): arthralgias and fatigue, severe    Family History  Problem Relation Age of Onset   GI problems Mother    Other Sister        PAIN ISSUES   Hearing loss Sister    Blindness Sister    Heart attack Neg Hx    Stroke Neg Hx      Prior to Admission medications   Medication Sig Start Date End Date Taking? Authorizing Provider  acetaminophen (TYLENOL) 325 MG tablet Take 2 tablets (650 mg total) by mouth every 4 (four) hours as  needed for mild pain (or temp > 37.5 C (99.5 F)). 02/14/21   Angiulli, Lavon Paganini, PA-C  Ascorbic Acid (VITAMIN C) 1000 MG tablet Take 1,000 mg by mouth daily.    [provider]  atropine 1 % ophthalmic solution Place 1 drop into the right eye 2 (two) times daily. 08/05/22   Rankin, Clent Demark, MD  brimonidine (ALPHAGAN) 0.2 % ophthalmic solution Place 1 drop into the right eye every 8 (eight) hours. 08/08/22 08/08/23  Rankin, Clent Demark, MD  brimonidine (ALPHAGAN) 0.2 % ophthalmic solution 1 drop into affected eye    [provider]  DULoxetine (CYMBALTA) 60 MG capsule Take 1 capsule (60 mg total) by mouth daily. 04/23/21   Lovorn, Jinny Blossom, MD  furosemide (LASIX) 20 MG tablet Take 2 tablets (40 mg total) by mouth daily. 02/14/21   Angiulli, Lavon Paganini, PA-C  levothyroxine (SYNTHROID) 25 MCG tablet Take 1 tablet (25 mcg total) by mouth daily before breakfast. 02/14/21   Angiulli, Lavon Paganini, PA-C  loratadine (CLARITIN) 10 MG tablet Take 10 mg by mouth daily.    [provider]  Multiple Vitamin (MULTIVITAMIN WITH MINERALS) TABS tablet Take 1 tablet by mouth daily.    [provider]  Multiple Vitamins-Minerals (OCUVITE ADULT 50+) CAPS Take 1 capsule by mouth daily.    [provider]  nitrofurantoin, macrocrystal-monohydrate, (MACROBID) 100 MG capsule Take 100 mg by mouth daily.    [provider]  phenazopyridine (PYRIDIUM) 200 MG tablet Take 200 mg by mouth See admin instructions. '200mg'$  oral at night And '200mg'$  extra twice daily as needed for bladder spasms 10/05/21   [provider]  polyethylene glycol (MIRALAX / GLYCOLAX) 17 g packet Take 17 g by mouth daily. Patient taking differently: Take 17 g by mouth daily as needed for mild constipation. 07/08/21   Mariel Aloe, MD  prednisoLONE acetate (PRED FORTE) 1 % ophthalmic suspension Place 1 drop into the right eye 2 (two) times daily. 08/05/22   Rankin, Clent Demark, MD  prednisoLONE acetate (PRED FORTE) 1 %  ophthalmic suspension 1 drop into affected eye    [provider]  pregabalin (LYRICA) 25 MG capsule Take 2 capsules (50 mg total) by mouth at bedtime. TAKE 1 CAPSULE(25 MG) BY MOUTH AT BEDTIME 10/29/21   Ward Givens, NP  primidone (MYSOLINE) 250 MG tablet Take 1 tablet (250 mg total) by mouth 2 (two) times daily. Patient taking differently: Take 250 mg by mouth 2 (two) times daily. Taking '100mg'$  02/14/21   Angiulli, Lavon Paganini, PA-C  timolol (TIMOPTIC) 0.5 % ophthalmic solution Place 1 drop into the right eye 2 (two) times daily. 08/08/22 08/08/23  Rankin, Clent Demark, MD  timolol (TIMOPTIC) 0.5 % ophthalmic solution 1 drop into affected eye    [provider]  topiramate (TOPAMAX) 50 MG tablet Take 1 tablet (50 mg total) by mouth 2 (two) times  daily. 02/27/22   Ward Givens, NP  traMADol (ULTRAM) 50 MG tablet Take 50-100 mg by mouth 3 (three) times daily as needed. 10/30/21   [provider]  vitamin B-12 (CYANOCOBALAMIN) 1000 MCG tablet Take 1 tablet (1,000 mcg total) by mouth every other day. Patient taking differently: Take 1,000 mcg by mouth See admin instructions. 1056mg oral daily on Sunday, Tuesday, Wednesday, Thursday, repeat. 02/14/21   Angiulli, DLavon Paganini PA-C  warfarin (COUMADIN) 5 MG tablet TAKE 1 AND 1/2 TO 2 TABLETS BY MOUTH ONCE DAILY OR AS DIRECTED BY COUMADIN CLINIC 07/08/22   SBelva Crome MD    Physical Exam: Vitals:   08/10/22 0115 08/10/22 0130 08/10/22 0200 08/10/22 0230  BP: (!) 184/91 (!) 180/87 (!) 179/87 (!) 180/86  Pulse: 69 70 70 70  Resp:   18 18  Temp:      TempSrc:      SpO2: 95% 95% 100% 92%    Constitutional: NAD, calm  Eyes: lids normal, conjunctival injection  ENMT: Mucous membranes are moist. Posterior pharynx clear of any exudate or lesions.   Neck: supple, no masses  Respiratory: no wheezing, no crackles. No accessory muscle use.  Cardiovascular: S1 & S2 heard, regular rate and rhythm. No significant JVD. Abdomen: No  distension, no tenderness, soft. Bowel sounds active.  Musculoskeletal: no clubbing / cyanosis. No joint deformity upper and lower extremities.   Skin: no significant rashes, lesions, ulcers. Warm, dry, well-perfused. Neurologic: No gross facial asymmetry. Gross hearing deficit. Moving all extremities. Alert and oriented. Resting tremor.  Psychiatric: Pleasant. Cooperative.    Labs and Imaging on Admission: I have personally reviewed following labs and imaging studies  CBC: Recent Labs  Lab 08/09/22 2055 08/10/22 0453  WBC 8.9 8.6  HGB 11.2* 11.2*  HCT 32.9* 32.0*  MCV 97.1 95.8  PLT 93* 81*   Basic Metabolic Panel: Recent Labs  Lab 08/09/22 2055  NA 134*  K 3.9  CL 97*  CO2 27  GLUCOSE 108*  BUN 22  CREATININE 1.29*  CALCIUM 9.4   GFR: Estimated Creatinine Clearance: 38.7 mL/min (A) (by C-G formula based on SCr of 1.29 mg/dL (H)). Liver Function Tests: No results for input(s): "AST", "ALT", "ALKPHOS", "BILITOT", "PROT", "ALBUMIN" in the last 168 hours. No results for input(s): "LIPASE", "AMYLASE" in the last 168 hours. No results for input(s): "AMMONIA" in the last 168 hours. Coagulation Profile: No results for input(s): "INR", "PROTIME" in the last 168 hours. Cardiac Enzymes: No results for input(s): "CKTOTAL", "CKMB", "CKMBINDEX", "TROPONINI" in the last 168 hours. BNP (last 3 results) No results for input(s): "PROBNP" in the last 8760 hours. HbA1C: No results for input(s): "HGBA1C" in the last 72 hours. CBG: No results for input(s): "GLUCAP" in the last 168 hours. Lipid Profile: No results for input(s): "CHOL", "HDL", "LDLCALC", "TRIG", "CHOLHDL", "LDLDIRECT" in the last 72 hours. Thyroid Function Tests: No results for input(s): "TSH", "T4TOTAL", "FREET4", "T3FREE", "THYROIDAB" in the last 72 hours. Anemia Panel: No results for input(s): "VITAMINB12", "FOLATE", "FERRITIN", "TIBC", "IRON", "RETICCTPCT" in the last 72 hours. Urine analysis:    Component  Value Date/Time   COLORURINE STRAW (A) 08/09/2022 2115   APPEARANCEUR CLEAR 08/09/2022 2115   LABSPEC 1.005 08/09/2022 2115   PHURINE 7.0 08/09/2022 2115   GLUCOSEU NEGATIVE 08/09/2022 2115   HGBUR LARGE (A) 08/09/2022 2115   BILIRUBINUR NEGATIVE 08/09/2022 2115   KETONESUR NEGATIVE 08/09/2022 2115   PROTEINUR NEGATIVE 08/09/2022 2115   UROBILINOGEN 0.2 06/18/2015 0441   NITRITE POSITIVE (  A) 08/09/2022 2115   LEUKOCYTESUR MODERATE (A) 08/09/2022 2115   Sepsis Labs: '@LABRCNTIP'$ (procalcitonin:4,lacticidven:4) )No results found for this or any previous visit (from the past 240 hour(s)).   Radiological Exams on Admission: CT Orbits W Contrast  Result Date: 08/09/2022 CLINICAL DATA:  Retinal disorder. EXAM: CT ORBITS WITH CONTRAST TECHNIQUE: Multidetector CT images was performed according to the standard protocol following intravenous contrast administration. RADIATION DOSE REDUCTION: This exam was performed according to the departmental dose-optimization program which includes automated exposure control, adjustment of the mA and/or kV according to patient size and/or use of iterative reconstruction technique. CONTRAST:  49m OMNIPAQUE IOHEXOL 300 MG/ML  SOLN COMPARISON:  None Available. FINDINGS: Orbits: A large amount heterogeneous increased attenuation (approximately 90.04 Hounsfield units) is seen within the right globe. The left globe is unremarkable. No evidence of orbital inflammation. Normal appearance of the optic nerve-sheath complexes, extraocular muscles, orbital fat and lacrimal glands. Visible paranasal sinuses: Clear. Soft tissues: Normal. Osseous: No fracture or aggressive lesion. Limited intracranial: No acute or significant finding. IMPRESSION: Large amount of hemorrhagic material within the right globe. Correlation with ophthalmologic examination is recommended. Electronically Signed   By: TVirgina NorfolkM.D.   On: 08/09/2022 23:25   CT Head Wo Contrast  Result Date:  08/09/2022 CLINICAL DATA:  Headache. EXAM: CT HEAD WITHOUT CONTRAST TECHNIQUE: Contiguous axial images were obtained from the base of the skull through the vertex without intravenous contrast. RADIATION DOSE REDUCTION: This exam was performed according to the departmental dose-optimization program which includes automated exposure control, adjustment of the mA and/or kV according to patient size and/or use of iterative reconstruction technique. COMPARISON:  July 02, 2021 FINDINGS: Brain: There is moderate severity cerebral atrophy with widening of the extra-axial spaces and ventricular dilatation. There are areas of decreased attenuation within the white matter tracts of the supratentorial brain, consistent with microvascular disease changes. A chronic left frontal parietal infarct is seen. Vascular: No hyperdense vessel or unexpected calcification. Skull: Normal. Negative for fracture or focal lesion. Sinuses/Orbits: A large amount of heterogeneous, hyperdense material (approximately 63.18 Hounsfield units) is seen within the right globe. This represents a new finding when compared to the prior study. Other: None. IMPRESSION: 1. Large amount of hemorrhagic material within the right globe. This represents a new finding when compared to the prior study and may represent sequelae associated with retinal detachment. Ophthalmology consult is recommended. 2. Chronic left frontal parietal infarct. 3. Generalized cerebral atrophy and chronic white matter small vessel ischemic changes, without evidence of an acute intracranial abnormality. Electronically Signed   By: TVirgina NorfolkM.D.   On: 08/09/2022 23:20   Color Fundus Photography Optos - OU - Both Eyes  Result Date: 08/08/2022 Right Eye Macula : retinal pigment epithelium abnormalities. Left Eye Progression has been stable. Disc findings include normal observations. Macula : drusen, geographic atrophy, retinal pigment epithelium abnormalities. Notes No view OS,  stable    Assessment/Plan   1. Intractable pain; choroidal hemorrhage  - Pt with choroidal hemorrhage followed by ophthalmology presents with severe right eye pain, worsening over 2-3 wks despite eye drops and oral analgesics  - Vision not salvageable per ophthalmology notes  - Requiring IV analgesia in ED  - Continue pain-control, discuss with ophthalmology in am    2. Hypertensive urgency  - SBP as high as 187 in ED  - Hx of HTN but no longer on antihypertensives  - Pain likely contributing   - Control pain, use hydralazine as needed    3.  CKD IIIa  - SCr is 1.29 on admission  - Renally-dose medications, monitor    4. PAF  - He has high CHADS-VASc, hx of CVA, and continued on warfarin despite choroidal hemorrhage    5. Thrombocytopenia  - Chronic, appears stable    DVT prophylaxis: warfarin  Code Status: DNR, confirmed on admission  Level of Care: Level of care: Med-Surg Family Communication: Son at bedside  Disposition Plan:  Patient is from: home  Anticipated d/c is to: Home  Anticipated d/c date is: 08/11/22  Patient currently: Pending pain-control, transition to oral medications  Consults called: none  Admission status: Observation     Vianne Bulls, MD [facility]  08/10/2022, 5:04 AM

## 2022-08-11 ENCOUNTER — Encounter (INDEPENDENT_AMBULATORY_CARE_PROVIDER_SITE_OTHER): Payer: Self-pay | Admitting: Ophthalmology

## 2022-08-11 DIAGNOSIS — R52 Pain, unspecified: Secondary | ICD-10-CM | POA: Diagnosis not present

## 2022-08-11 LAB — CBC
HCT: 30 % — ABNORMAL LOW (ref 39.0–52.0)
Hemoglobin: 10.5 g/dL — ABNORMAL LOW (ref 13.0–17.0)
MCH: 33.1 pg (ref 26.0–34.0)
MCHC: 35 g/dL (ref 30.0–36.0)
MCV: 94.6 fL (ref 80.0–100.0)
Platelets: 84 10*3/uL — ABNORMAL LOW (ref 150–400)
RBC: 3.17 MIL/uL — ABNORMAL LOW (ref 4.22–5.81)
RDW: 14.4 % (ref 11.5–15.5)
WBC: 8.4 10*3/uL (ref 4.0–10.5)
nRBC: 0 % (ref 0.0–0.2)

## 2022-08-11 LAB — BASIC METABOLIC PANEL
Anion gap: 9 (ref 5–15)
BUN: 16 mg/dL (ref 8–23)
CO2: 24 mmol/L (ref 22–32)
Calcium: 8.8 mg/dL — ABNORMAL LOW (ref 8.9–10.3)
Chloride: 98 mmol/L (ref 98–111)
Creatinine, Ser: 1.18 mg/dL (ref 0.61–1.24)
GFR, Estimated: 57 mL/min — ABNORMAL LOW (ref >60)
Glucose, Bld: 105 mg/dL — ABNORMAL HIGH (ref 70–99)
Potassium: 4.1 mmol/L (ref 3.5–5.1)
Sodium: 131 mmol/L — ABNORMAL LOW (ref 135–145)

## 2022-08-11 LAB — URINE CULTURE

## 2022-08-11 LAB — PROTIME-INR
INR: 1.9 — ABNORMAL HIGH (ref 0.8–1.2)
Prothrombin Time: 21.3 seconds — ABNORMAL HIGH (ref 11.4–15.2)

## 2022-08-11 MED ORDER — WARFARIN SODIUM 10 MG PO TABS
10.0000 mg | ORAL_TABLET | Freq: Once | ORAL | Status: AC
  Administered 2022-08-11: 10 mg via ORAL
  Filled 2022-08-11: qty 1

## 2022-08-11 MED ORDER — PREDNISOLONE ACETATE 1 % OP SUSP
1.0000 [drp] | Freq: Three times a day (TID) | OPHTHALMIC | Status: DC
  Administered 2022-08-11 – 2022-08-13 (×10): 1 [drp] via OPHTHALMIC

## 2022-08-11 MED ORDER — DORZOLAMIDE HCL-TIMOLOL MAL 2-0.5 % OP SOLN
1.0000 [drp] | Freq: Two times a day (BID) | OPHTHALMIC | Status: DC
Start: 2022-08-11 — End: 2022-08-13
  Administered 2022-08-11 – 2022-08-13 (×5): 1 [drp] via OPHTHALMIC
  Filled 2022-08-11: qty 10

## 2022-08-11 NOTE — Progress Notes (Signed)
PROGRESS NOTE Douglas Walker  MEQ:[id number] DOB: 17-Jan-1925 DOA: 08/09/2022 PCP: Josetta Huddle, MD   Brief Narrative/Hospital Course: 88 y.o. male with medical history significant for CKD 3a, tremor, chronic diastolic CHF, PAF on warfarin, chronic Foley catheter, recurrent UTI, and choroidal hemorrhage on the right presented to the ED with severe right eye pain ongoing 2 to 3 weeks,has been following with ophthalmology and found to have choroidal hemorrhage, per ophthalmology, the vision is not salvageable, warfarin has been continued, and he is being treated with topical atropine, Pred forte, and brimonidine, po tramadol at home but is unable to sleep due to the pain.     ED Course: Upon arrival to the ED, patient is found to be afebrile and hypertensive.  Chemistry panel with creatinine 1.29.  CBC with chronic stable thrombocytopenia.  CT of the head and orbits notable for hemorrhagic material in the right globe.  Patient was given Percocet in the ED, continued to have pain, and was given IV Dilaudid.  Patient admitted for intractable pain hypertensive urgency    Subjective: Overnight afebrile, patient has been needing IV Dilaudid during night and this morning along with oral oxy, Blood pressure improving He started complaining pain this afternoon. Got iv dialudid early this am   Assessment and Plan: Principal Problem:   Intractable pain Active Problems:   Thrombocytopenia (HCC)   Paroxysmal atrial fibrillation (HCC)   Essential hypertension   Chronic diastolic CHF (congestive heart failure) (HCC)   Stage 3a chronic kidney disease (CKD) (HCC)   Choroidal hemorrhage, right   Eye pain, right   Intractable pain on rt eye with known choroidal hemorrhage-Blind painful right eye Secondary elevation of intraocular pressure: Ongoing 2-3 weeks,has been following with ophthalmology and found to have choroidal hemorrhage-per ophthalmology Dr Zadie Rhine, the vision is not salvageable.appreciate  ophthalmology input, continue current measures to decrease IOP with atropine twice daily, prednisone increased to 4 times daily, continue brimonidine, timolol discontinued and switched to dorzolamide/timolol twice daily right eye.  Continue IV Dilaudid/oxycodone, transition to oral narcotics to optimize pain control. "He was referred to oculoplastics for opinion regarding possible temporizing techniques-retrobulbar alcohol injection and/or evisceration"-and will be pursued outpatient once pain is optimized  Thrombocytopenia:Platelet 80s to 93k,monitor.  WLN:LGXQ controlled on anticoagulation with Coumadin pharmacy consulted-to dose Coumadin continue to keep therapeutic 2-3.He is followed by Dr. Lovena Le and Dr. Tamala Julian from cardiology as OP. Recent Labs  Lab 08/10/22 0453 08/11/22 0222  INR 2.4* 1.9*    Essential hypertension W/ hypertensive urgency: BP in 180s on admission likely due to pain continue pain control continue Lasix, prn meds. Added hydralazine 25 mg po q8hr-and pain is improving  Chronic diastolic CHF: Euvolemic on home Lasix  Stage 3a chronic kidney disease: creat at baseline Recent Labs  Lab 08/09/22 2055 08/10/22 0453 08/11/22 0222  BUN '22 20 16  '$ CREATININE 1.29* 1.27* 1.18    DVT prophylaxis:   On Coumadin Code Status:   Code Status: DNR Family Communication: plan of care discussed with patient/no family at bedside. I called and updated son and is aware about the plan.  Patient status is: Inpatient for ongoing pain control Level of care: Med-Surg  Dispo: The patient is from: home            Anticipated disposition: home 1-2 days once pain controlled on oral regimen Mobility Assessment (last 72 hours)     Mobility Assessment   No documentation.          Objective: Vitals last 24 hrs:  Vitals:   08/10/22 1927 08/11/22 0026 08/11/22 0607 08/11/22 0932  BP: (!) 164/78 (!) 154/74 (!) 159/79 (!) 154/80  Pulse: 69 69 70 69  Resp: '20 17 16 18  '$ Temp: 98.3 F  (36.8 C) 98 F (36.7 C) 98.6 F (37 C) 99.1 F (37.3 C)  TempSrc: Oral Oral Oral Oral  SpO2: 98% 95% 93% 92%  Weight:      Height:       Weight change:   Physical Examination: General exam: AA oriented,older than stated age, weak appearing. HEENT:Oral mucosa moist, Ear/Nose WNL grossly, dentition normal. Respiratory system: bilaterally diminished, no use of accessory muscle Cardiovascular system: S1 & S2 +, No JVD,. Gastrointestinal system: Abdomen soft,NT,ND,BS+ Nervous System:Alert, awake, moving extremities and grossly nonfocal. Rt eye closed. Extremities: LE ankle edema neg, distal peripheral pulses palpable.  Skin: No rashes,no icterus. MSK: Normal muscle bulk,tone, power   Medications reviewed:  Scheduled Meds:  acetaminophen  650 mg Oral Q6H   atropine  1 drop Right Eye BID   brimonidine  1 drop Right Eye TID   Chlorhexidine Gluconate Cloth  6 each Topical Daily   dorzolamide-timolol  1 drop Right Eye BID   DULoxetine  60 mg Oral Daily   furosemide  40 mg Oral Daily   hydrALAZINE  25 mg Oral Q8H   levothyroxine  25 mcg Oral QAC breakfast   nitrofurantoin (macrocrystal-monohydrate)  100 mg Oral Daily   prednisoLONE acetate  1 drop Right Eye TID AC & HS   pregabalin  50 mg Oral QHS   topiramate  50 mg Oral BID   warfarin  10 mg Oral ONCE-1600   Warfarin - Pharmacist Dosing Inpatient   Does not apply q1600   Continuous Infusions:    Diet Order             Diet regular Room service appropriate? No; Fluid consistency: Thin  Diet effective now                     Intake/Output Summary (Last 24 hours) at 08/11/2022 1338 Last data filed at 08/11/2022 0924 Gross per 24 hour  Intake 1310 ml  Output 1000 ml  Net 310 ml   Net IO Since Admission: -290 mL [08/11/22 1338]  Wt Readings from Last 3 Encounters:  08/10/22 89.6 kg  08/02/22 89.4 kg  07/24/22 90.3 kg     Unresulted Labs (From admission, onward)     Start     Ordered   08/11/22 0500   Protime-INR  Daily at 5am,   R      08/10/22 1451          Data Reviewed: I have personally reviewed following labs and imaging studies CBC: Recent Labs  Lab 08/09/22 2055 08/10/22 0453 08/11/22 0222  WBC 8.9 8.6 8.4  HGB 11.2* 11.2* 10.5*  HCT 32.9* 32.0* 30.0*  MCV 97.1 95.8 94.6  PLT 93* 81* 84*   Basic Metabolic Panel: Recent Labs  Lab 08/09/22 2055 08/10/22 0453 08/11/22 0222  NA 134* 134* 131*  K 3.9 4.0 4.1  CL 97* 98 98  CO2 '27 25 24  '$ GLUCOSE 108* 107* 105*  BUN '22 20 16  '$ CREATININE 1.29* 1.27* 1.18  CALCIUM 9.4 9.0 8.8*   GFR: Estimated Creatinine Clearance: 42.4 mL/min (by C-G formula based on SCr of 1.18 mg/dL). Liver Function Tests: No results for input(s): "AST", "ALT", "ALKPHOS", "BILITOT", "PROT", "ALBUMIN" in the last 168 hours. No results for input(s): "LIPASE", "AMYLASE"  in the last 168 hours. No results for input(s): "AMMONIA" in the last 168 hours. Coagulation Profile: Recent Labs  Lab 08/10/22 0453 08/11/22 0222  INR 2.4* 1.9*   BNP (last 3 results) No results for input(s): "PROBNP" in the last 8760 hours. HbA1C: No results for input(s): "HGBA1C" in the last 72 hours. CBG: No results for input(s): "GLUCAP" in the last 168 hours. Lipid Profile: No results for input(s): "CHOL", "HDL", "LDLCALC", "TRIG", "CHOLHDL", "LDLDIRECT" in the last 72 hours. Thyroid Function Tests: No results for input(s): "TSH", "T4TOTAL", "FREET4", "T3FREE", "THYROIDAB" in the last 72 hours. Sepsis Labs: No results for input(s): "PROCALCITON", "LATICACIDVEN" in the last 168 hours.  Recent Results (from the past 240 hour(s))  Urine Culture     Status: Abnormal   Collection Time: 08/10/22  1:44 AM   Specimen: Urine, Catheterized  Result Value Ref Range Status   Specimen Description URINE, CATHETERIZED  Final   Special Requests   Final    NONE Performed at [facility], [address] [place], [place] [address]    Culture MULTIPLE SPECIES PRESENT,  SUGGEST RECOLLECTION (A)  Final   Report Status 08/11/2022 FINAL  Final    Antimicrobials: Anti-infectives (From admission, onward)    Start     Dose/Rate Route Frequency Ordered Stop   08/10/22 1000  nitrofurantoin (macrocrystal-monohydrate) (MACROBID) capsule 100 mg        100 mg Oral Daily 08/10/22 0337        Culture/Microbiology    Component Value Date/Time   SDES URINE, CATHETERIZED 08/10/2022 0144   SPECREQUEST  08/10/2022 0144    NONE Performed at [facility], [place] [address] [place], [place] [address]    CULT MULTIPLE SPECIES PRESENT, SUGGEST RECOLLECTION (A) 08/10/2022 0144   REPTSTATUS 08/11/2022 FINAL 08/10/2022 0144    Other culture-see note Radiology Studies: CT Orbits W Contrast  Result Date: 08/09/2022 CLINICAL DATA:  Retinal disorder. EXAM: CT ORBITS WITH CONTRAST TECHNIQUE: Multidetector CT images was performed according to the standard protocol following intravenous contrast administration. RADIATION DOSE REDUCTION: This exam was performed according to the departmental dose-optimization program which includes automated exposure control, adjustment of the mA and/or kV according to patient size and/or use of iterative reconstruction technique. CONTRAST:  82m OMNIPAQUE IOHEXOL 300 MG/ML  SOLN COMPARISON:  None Available. FINDINGS: Orbits: A large amount heterogeneous increased attenuation (approximately 90.04 Hounsfield units) is seen within the right globe. The left globe is unremarkable. No evidence of orbital inflammation. Normal appearance of the optic nerve-sheath complexes, extraocular muscles, orbital fat and lacrimal glands. Visible paranasal sinuses: Clear. Soft tissues: Normal. Osseous: No fracture or aggressive lesion. Limited intracranial: No acute or significant finding. IMPRESSION: Large amount of hemorrhagic material within the right globe. Correlation with ophthalmologic examination is recommended. Electronically Signed   By: TVirgina NorfolkM.D.    On: 08/09/2022 23:25   CT Head Wo Contrast  Result Date: 08/09/2022 CLINICAL DATA:  Headache. EXAM: CT HEAD WITHOUT CONTRAST TECHNIQUE: Contiguous axial images were obtained from the base of the skull through the vertex without intravenous contrast. RADIATION DOSE REDUCTION: This exam was performed according to the departmental dose-optimization program which includes automated exposure control, adjustment of the mA and/or kV according to patient size and/or use of iterative reconstruction technique. COMPARISON:  July 02, 2021 FINDINGS: Brain: There is moderate severity cerebral atrophy with widening of the extra-axial spaces and ventricular dilatation. There are areas of decreased attenuation within the white matter tracts of the supratentorial brain,  consistent with microvascular disease changes. A chronic left frontal parietal infarct is seen. Vascular: No hyperdense vessel or unexpected calcification. Skull: Normal. Negative for fracture or focal lesion. Sinuses/Orbits: A large amount of heterogeneous, hyperdense material (approximately 63.18 Hounsfield units) is seen within the right globe. This represents a new finding when compared to the prior study. Other: None. IMPRESSION: 1. Large amount of hemorrhagic material within the right globe. This represents a new finding when compared to the prior study and may represent sequelae associated with retinal detachment. Ophthalmology consult is recommended. 2. Chronic left frontal parietal infarct. 3. Generalized cerebral atrophy and chronic white matter small vessel ischemic changes, without evidence of an acute intracranial abnormality. Electronically Signed   By: Virgina Norfolk M.D.   On: 08/09/2022 23:20     LOS: 1 day   Antonieta Pert, MD [facility]  08/11/2022, 1:38 PM

## 2022-08-11 NOTE — Progress Notes (Signed)
ANTICOAGULATION CONSULT NOTE  Pharmacy Consult for Warfarin  Indication: atrial fibrillation  Allergies  Allergen Reactions   Sulfa Antibiotics Other (See Comments)    Weakness Dizziness  Sweats    Mysoline [Primidone] Other (See Comments)    Sedation    Zocor [Simvastatin] Other (See Comments)    Arthralgias Fatigue   Vital Signs: Temp: 99.1 F (37.3 C) (08/13 0932) Temp Source: Oral (08/13 0932) BP: 154/80 (08/13 0932) Pulse Rate: 69 (08/13 0932)  Labs: Recent Labs    08/09/22 2055 08/10/22 0453 08/11/22 0222  HGB 11.2* 11.2* 10.5*  HCT 32.9* 32.0* 30.0*  PLT 93* 81* 84*  LABPROT  --  26.0* 21.3*  INR  --  2.4* 1.9*  CREATININE 1.29* 1.27* 1.18    Estimated Creatinine Clearance: 42.4 mL/min (by C-G formula based on SCr of 1.18 mg/dL).  Medical History: Past Medical History:  Diagnosis Date   Arthritis    shoulders and back   Ascending aorta dilatation (HCC)    Cancer (HCC)    skin cancers   Chronic diastolic (congestive) heart failure (HCC)    Chronic low back pain 12/15/2017   CKD (chronic kidney disease) stage 3, GFR 30-59 ml/min (HCC)    Dysrhythmia     Paroxysmal atrial fibrillation; Tachycardia-bradycardia syndrome   Essential tremor 02/28/2016   GERD (gastroesophageal reflux disease)    Hernia    History of hiatal hernia    Hyperlipemia    Hypertension    Macular degeneration    Neuromuscular disorder (HCC)    neuropathy   Paroxysmal atrial fibrillation ([place]) 10/28/2013   Presence of permanent cardiac pacemaker 12/04/2018   Pulmonary hypertension ([place])    Stroke ([place]) 12/2020   Tachycardia-bradycardia syndrome ([place]) 09/13/2014   TIA (transient ischemic attack)    Assessment: 88 y/o M admitted with right eye pain. Patient has a known choroidal hemorrhage of the right eye and warfarin therapy was continued given risk-benefit. Pharmacy consulted for warfarin dosing. Outpatient warfarin regimen as of 07/09/22 is warfarin 7.5 mg daily except 10  mg on Sundays and Wednesdays.   INR today is slightly below goal of 2-3 at 1.9 on home regimen. Hgb 10.5 stable, plt 84 stable.  Goal of Therapy:  INR 2-3 Monitor platelets by anticoagulation protocol: Yes   Plan:  Give warfarin 10 mg x1 tonight. Daily INR and monitor CBC. Monitor for signs/symptoms of bleeding.  Jeneen Rinks, Pharm.D PGY1 Pharmacy Resident 08/11/2022 9:52 AM

## 2022-08-12 DIAGNOSIS — R52 Pain, unspecified: Secondary | ICD-10-CM | POA: Diagnosis not present

## 2022-08-12 LAB — PROTIME-INR
INR: 2.3 — ABNORMAL HIGH (ref 0.8–1.2)
Prothrombin Time: 25.2 seconds — ABNORMAL HIGH (ref 11.4–15.2)

## 2022-08-12 MED ORDER — HYDRALAZINE HCL 25 MG PO TABS: 25.0000 mg | ORAL_TABLET | Freq: Four times a day (QID) | ORAL | Status: DC | PRN

## 2022-08-12 MED ORDER — WARFARIN SODIUM 7.5 MG PO TABS
7.5000 mg | ORAL_TABLET | ORAL | Status: DC
  Administered 2022-08-12: 7.5 mg via ORAL
  Filled 2022-08-12 (×2): qty 1

## 2022-08-12 MED ORDER — WARFARIN SODIUM 10 MG PO TABS
10.0000 mg | ORAL_TABLET | ORAL | Status: DC
Start: 2022-08-14 — End: 2022-08-13

## 2022-08-12 MED ORDER — ACETAMINOPHEN 325 MG PO TABS
650.0000 mg | ORAL_TABLET | Freq: Four times a day (QID) | ORAL | Status: DC | PRN
Start: 2022-08-12 — End: 2022-08-13

## 2022-08-12 NOTE — Progress Notes (Signed)
PROGRESS NOTE Douglas Walker  ZTI:[id number] DOB: March 24, 1925 DOA: 08/09/2022 PCP: Josetta Huddle, MD   Brief Narrative/Hospital Course: 88 y.o. male with medical history significant for CKD 3a, tremor, chronic diastolic CHF, PAF on warfarin, chronic Foley catheter, recurrent UTI, and choroidal hemorrhage on the right presented to the ED with severe right eye pain ongoing 2 to 3 weeks,has been following with ophthalmology and found to have choroidal hemorrhage, per ophthalmology, the vision is not salvageable, warfarin has been continued, and he is being treated with topical atropine, Pred forte, and brimonidine, po tramadol at home but is unable to sleep due to the pain.     ED Course: Upon arrival to the ED, patient is found to be afebrile and hypertensive.  Chemistry panel with creatinine 1.29.  CBC with chronic stable thrombocytopenia.  CT of the head and orbits notable for hemorrhagic material in the right globe.  Patient was given Percocet in the ED, continued to have pain, and was given IV Dilaudid.  Patient admitted for intractable pain hypertensive urgency    Subjective:  Seen this am, he c/o being cold No c/o eye pain per nursing. Needed iv dilaudid iv twice yesterday Family wants to see how he does w/o iv pain meds and they are trying to arrange/contact ophthalmologist for referral to oculoplasty.    Assessment and Plan: Principal Problem:   Intractable pain Active Problems:   Thrombocytopenia (HCC)   Paroxysmal atrial fibrillation (HCC)   Essential hypertension   Chronic diastolic CHF (congestive heart failure) (HCC)   Stage 3a chronic kidney disease (CKD) (HCC)   Choroidal hemorrhage, right   Eye pain, right   Intractable pain on rt eye with known choroidal hemorrhage-Blind painful right eye Secondary elevation of intraocular pressure: Ongoing 2-3 weeks,has been following with ophthalmology and found to have choroidal hemorrhage-per ophthalmology Dr Zadie Rhine, the vision is  not salvageable.appreciate ophthalmology input, continue current measures to decrease IOP with atropine twice daily, prednisone increased to 4 times daily, continue brimonidine, timolol discontinued and switched to dorzolamide/timolol twice daily right eye.  Add tyoenol,  cont  po oxycodone, to optimize pain control. "He was referred to oculoplastics for opinion regarding possible temporizing techniques-retrobulbar alcohol injection and/or evisceration"-and will be pursued outpatient once pain is optimized-family actively working on it.  We will monitor how he does on oral regimen alone today.  Thrombocytopenia:Platelet 80s to 93k,monitor.  ASN:KNLZ controlled on anticoagulation with Coumadin- pharmacy dosing.  Is followed by Dr. Lovena Le and Dr. Tamala Julian from cardiology as OP. Recent Labs  Lab 08/10/22 0453 08/11/22 0222 08/12/22 0650  INR 2.4* 1.9* 2.3*     Essential hypertension W/ hypertensive urgency: BP in 180s on admission likely due to pain continue pain control continue Lasix, prn meds.  We will take him off  scheduled hydralazine 25 mg  and cont prn only.  Chronic diastolic CHF: Volume status stable/euvolemic on home Lasix   Stage 3a chronic kidney disease: creat at baseline.  Monitor Recent Labs  Lab 08/09/22 2055 08/10/22 0453 08/11/22 0222  BUN '22 20 16  '$ CREATININE 1.29* 1.27* 1.18     DVT prophylaxis:   On Coumadin Code Status:   Code Status: DNR Family Communication: plan of care discussed with patient/no family at bedside. I called and updated son/daughter again today  Patient status is: Inpatient for ongoing pain control Level of care: Med-Surg  Dispo: The patient is from: home            Anticipated disposition: home tomorrow, if pain  is optimized on oral regimen   Mobility Assessment (last 72 hours)     Mobility Assessment     Row Name 08/11/22 0845           Does patient have an order for bedrest or is patient medically unstable No - Continue assessment        What is the highest level of mobility based on the progressive mobility assessment? Level 5 (Walks with assist in room/hall) - Balance while stepping forward/back and can walk in room with assist - Complete               Objective: Vitals last 24 hrs: Vitals:   08/11/22 0932 08/11/22 2012 08/12/22 0529 08/12/22 0841  BP: (!) 154/80 (!) 152/75 134/66 128/65  Pulse: 69 70  69  Resp: '18 17  18  '$ Temp: 99.1 F (37.3 C) 97.6 F (36.4 C)  98 F (36.7 C)  TempSrc: Oral Oral  Oral  SpO2: 92% 95%  95%  Weight:      Height:       Weight change:   Physical Examination: General exam: AA, elderly, frail older than stated age, weak appearing. HEENT:Oral mucosa moist, Ear/Nose WNL grossly, dentition normal. Respiratory system: bilaterally diminished, no use of accessory muscle Cardiovascular system: S1 & S2 +, No JVD,. Gastrointestinal system: Abdomen soft,NT,ND,BS+ Nervous System:Alert, awake, moving extremities and grossly nonfocal, rt eye closed. Extremities: LE ankle edema neg, distal peripheral pulses palpable.  Skin: No rashes,no icterus. MSK: Normal muscle bulk,tone, power   Medications reviewed:  Scheduled Meds:  acetaminophen  650 mg Oral Q6H   atropine  1 drop Right Eye BID   brimonidine  1 drop Right Eye TID   Chlorhexidine Gluconate Cloth  6 each Topical Daily   dorzolamide-timolol  1 drop Right Eye BID   DULoxetine  60 mg Oral Daily   furosemide  40 mg Oral Daily   levothyroxine  25 mcg Oral QAC breakfast   nitrofurantoin (macrocrystal-monohydrate)  100 mg Oral Daily   prednisoLONE acetate  1 drop Right Eye TID AC & HS   pregabalin  50 mg Oral QHS   topiramate  50 mg Oral BID   Warfarin - Pharmacist Dosing Inpatient   Does not apply q1600   Continuous Infusions:    Diet Order             Diet regular Room service appropriate? No; Fluid consistency: Thin  Diet effective now                     Intake/Output Summary (Last 24 hours) at 08/12/2022  1018 Last data filed at 08/12/2022 0600 Gross per 24 hour  Intake 350 ml  Output 2050 ml  Net -1700 ml    Net IO Since Admission: -1,990 mL [08/12/22 1018]  Wt Readings from Last 3 Encounters:  08/10/22 89.6 kg  08/02/22 89.4 kg  07/24/22 90.3 kg     Unresulted Labs (From admission, onward)     Start     Ordered   08/11/22 0500  Protime-INR  Daily at 5am,   R      08/10/22 1451          Data Reviewed: I have personally reviewed following labs and imaging studies CBC: Recent Labs  Lab 08/09/22 2055 08/10/22 0453 08/11/22 0222  WBC 8.9 8.6 8.4  HGB 11.2* 11.2* 10.5*  HCT 32.9* 32.0* 30.0*  MCV 97.1 95.8 94.6  PLT 93* 81* 84*  Basic Metabolic Panel: Recent Labs  Lab 08/09/22 2055 08/10/22 0453 08/11/22 0222  NA 134* 134* 131*  K 3.9 4.0 4.1  CL 97* 98 98  CO2 '27 25 24  '$ GLUCOSE 108* 107* 105*  BUN '22 20 16  '$ CREATININE 1.29* 1.27* 1.18  CALCIUM 9.4 9.0 8.8*    GFR: Estimated Creatinine Clearance: 42.4 mL/min (by C-G formula based on SCr of 1.18 mg/dL). Liver Function Tests: No results for input(s): "AST", "ALT", "ALKPHOS", "BILITOT", "PROT", "ALBUMIN" in the last 168 hours. No results for input(s): "LIPASE", "AMYLASE" in the last 168 hours. No results for input(s): "AMMONIA" in the last 168 hours. Coagulation Profile: Recent Labs  Lab 08/10/22 0453 08/11/22 0222 08/12/22 0650  INR 2.4* 1.9* 2.3*    BNP (last 3 results) No results for input(s): "PROBNP" in the last 8760 hours. HbA1C: No results for input(s): "HGBA1C" in the last 72 hours. CBG: No results for input(s): "GLUCAP" in the last 168 hours. Lipid Profile: No results for input(s): "CHOL", "HDL", "LDLCALC", "TRIG", "CHOLHDL", "LDLDIRECT" in the last 72 hours. Thyroid Function Tests: No results for input(s): "TSH", "T4TOTAL", "FREET4", "T3FREE", "THYROIDAB" in the last 72 hours. Sepsis Labs: No results for input(s): "PROCALCITON", "LATICACIDVEN" in the last 168 hours.  Recent Results  (from the past 240 hour(s))  Urine Culture     Status: Abnormal   Collection Time: 08/10/22  1:44 AM   Specimen: Urine, Catheterized  Result Value Ref Range Status   Specimen Description URINE, CATHETERIZED  Final   Special Requests   Final    NONE Performed at [facility], [address] [place], [place] [address]    Culture MULTIPLE SPECIES PRESENT, SUGGEST RECOLLECTION (A)  Final   Report Status 08/11/2022 FINAL  Final    Antimicrobials: Anti-infectives (From admission, onward)    Start     Dose/Rate Route Frequency Ordered Stop   08/10/22 1000  nitrofurantoin (macrocrystal-monohydrate) (MACROBID) capsule 100 mg        100 mg Oral Daily 08/10/22 0337        Culture/Microbiology    Component Value Date/Time   SDES URINE, CATHETERIZED 08/10/2022 0144   SPECREQUEST  08/10/2022 0144    NONE Performed at [facility], [place] [address] [place], [place] [address]    CULT MULTIPLE SPECIES PRESENT, SUGGEST RECOLLECTION (A) 08/10/2022 0144   REPTSTATUS 08/11/2022 FINAL 08/10/2022 0144    Other culture-see note Radiology Studies: No results found.   LOS: 2 days   Antonieta Pert, MD [facility]  08/12/2022, 10:18 AM

## 2022-08-12 NOTE — Progress Notes (Signed)
ANTICOAGULATION CONSULT NOTE  Pharmacy Consult for Warfarin  Indication: atrial fibrillation  Allergies  Allergen Reactions   Sulfa Antibiotics Other (See Comments)    Weakness Dizziness  Sweats    Mysoline [Primidone] Other (See Comments)    Sedation    Zocor [Simvastatin] Other (See Comments)    Arthralgias Fatigue   Vital Signs: Temp: 98 F (36.7 C) (08/14 0841) Temp Source: Oral (08/14 0841) BP: 128/65 (08/14 0841) Pulse Rate: 69 (08/14 0841)  Labs: Recent Labs    08/09/22 2055 08/10/22 0453 08/11/22 0222 08/12/22 0650  HGB 11.2* 11.2* 10.5*  --   HCT 32.9* 32.0* 30.0*  --   PLT 93* 81* 84*  --   LABPROT  --  26.0* 21.3* 25.2*  INR  --  2.4* 1.9* 2.3*  CREATININE 1.29* 1.27* 1.18  --     Estimated Creatinine Clearance: 42.4 mL/min (by C-G formula based on SCr of 1.18 mg/dL).  Medical History: Past Medical History:  Diagnosis Date   Arthritis    shoulders and back   Ascending aorta dilatation (HCC)    Cancer (HCC)    skin cancers   Chronic diastolic (congestive) heart failure (HCC)    Chronic low back pain 12/15/2017   CKD (chronic kidney disease) stage 3, GFR 30-59 ml/min (HCC)    Dysrhythmia     Paroxysmal atrial fibrillation; Tachycardia-bradycardia syndrome   Essential tremor 02/28/2016   GERD (gastroesophageal reflux disease)    Hernia    History of hiatal hernia    Hyperlipemia    Hypertension    Macular degeneration    Neuromuscular disorder (HCC)    neuropathy   Paroxysmal atrial fibrillation ([place]) 10/28/2013   Presence of permanent cardiac pacemaker 12/04/2018   Pulmonary hypertension ([place])    Stroke ([place]) 12/2020   Tachycardia-bradycardia syndrome ([place]) 09/13/2014   TIA (transient ischemic attack)    Assessment: 88 y/o M admitted with right eye pain. Patient has a known choroidal hemorrhage of the right eye and warfarin therapy was continued given risk-benefit. Pharmacy consulted for warfarin dosing. Outpatient warfarin regimen as  of 07/09/22 is warfarin 7.5 mg daily except 10 mg on Sundays and Wednesdays. INR on admit 2.4. INRs in the last 10 months have been 1.7 -2.5.   INR 2.3 at goal.   Goal of Therapy:  INR 2-3 Monitor platelets by anticoagulation protocol: Yes   Plan:  Schedule home dose 7.'5mg'$  daily except '10mg'$  on Sun/Wed F/u INR QMon/Thur Monitor for signs/symptoms of bleeding.   Benetta Spar, PharmD, BCPS, BCCP Clinical Pharmacist  Please check AMION for all [facility] phone numbers After 10:00 PM, call [place] [phone]

## 2022-08-12 NOTE — Progress Notes (Signed)
Mobility Specialist - Progress Note   08/12/22 1500  Mobility  Activity Ambulated with assistance in hallway  Level of Assistance Standby assist, set-up cues, supervision of patient - no hands on  Assistive Device Front wheel walker  Distance Ambulated (ft) 30 ft  Activity Response Tolerated well  $Mobility charge 1 Mobility    Pt received in bathroom and agreeable to mobility. Needed cues to locate doorway/directions. Left in bathroom with call bell and all needs met.   [name] Mobility Specialist

## 2022-08-13 DIAGNOSIS — R52 Pain, unspecified: Secondary | ICD-10-CM | POA: Diagnosis not present

## 2022-08-13 DIAGNOSIS — H31301 Unspecified choroidal hemorrhage, right eye: Secondary | ICD-10-CM | POA: Diagnosis not present

## 2022-08-13 DIAGNOSIS — I5032 Chronic diastolic (congestive) heart failure: Secondary | ICD-10-CM | POA: Diagnosis not present

## 2022-08-13 DIAGNOSIS — I10 Essential (primary) hypertension: Secondary | ICD-10-CM | POA: Diagnosis not present

## 2022-08-13 DIAGNOSIS — H5711 Ocular pain, right eye: Secondary | ICD-10-CM

## 2022-08-13 MED ORDER — DORZOLAMIDE HCL-TIMOLOL MAL 2-0.5 % OP SOLN: 1.0000 [drp] | Freq: Two times a day (BID) | OPHTHALMIC | 0 refills | Status: DC

## 2022-08-13 MED ORDER — OXYCODONE HCL 5 MG PO TABS: 5.0000 mg | ORAL_TABLET | ORAL | 0 refills | Status: AC | PRN

## 2022-08-13 NOTE — Discharge Summary (Signed)
Physician Discharge Summary   Patient: Douglas Walker MRN: [id number] DOB: 07/22/1925  Admit date:     08/09/2022  Discharge date: 08/13/22  Discharge Physician: Flora Lipps   PCP: Josetta Huddle, MD   Recommendations at discharge:   Follow-up with your ophthalmologist as has been scheduled as outpatient.  Follow-up with primary care physician in 1 to 2 weeks or as necessary.    Discharge Diagnoses: Active Problems:   Thrombocytopenia (HCC)   Paroxysmal atrial fibrillation (HCC)   Essential hypertension   Chronic diastolic CHF (congestive heart failure) (HCC)   Stage 3a chronic kidney disease (CKD) (HCC)   Choroidal hemorrhage, right   Eye pain, right  Principal Problem (Resolved):   Intractable pain  Hospital Course:  88 years old male with past medical history of CKD stage IIIa, chronic diastolic congestive heart failure, paroxysmal atrial fibrillation on warfarin, chronic Foley catheter with recurrent UTI and choroidal hemorrhage on the right presented to the ED with severe right eye pain for 2 to 3 weeks.  Patient had been following with ophthalmology and found to have choroidal hemorrhage. Per ophthalmology, the vision was not salvageable.  Warfarin was continued but and was continued on Pred forte, and brimonidine po tramadol at home but was unable to  sleep due to the pain, so he presented to the ED.  In the ED patient was hypertensive.  Creatinine was 1.2.  CBC showed mild thrombocytopenia.  CT scan of the head and orbits showed hemorrhagic material in the right globe.  Patient did have intractable pain and hypertensive urgency so was admitted to the hospital.    Following conditions were addressed during hospitalization,  Intractable pain on right eye with known choroidal hemorrhage-Blind painful right eye, Secondary elevation of intraocular pressure: Pain has improved with eyedrops and oxycodone.  Has been seen by by ophthalmology in hospitalization and the impression  is that the the vision is not salvageable.condition is outpatient follow-up for palliative surgical treatment while continuing  atropine, prednisone, dorzolamide/timolol drops to decrease ocular pressure.  Continue tylenol,  oxycodone on discharge.  Tramadol from home has been discontinued.Marland Kitchen  He will follow-up with ophthalmology as outpatient for oculoplastics -retrobulbar alcohol injection and/or enucleation.  Communicated with the patient's son on the phone who stated that he does have an appointment with ophthalmologist this week.     Thrombocytopenia: Thrombocytopenia with latest platelet count of 84.   Paroxysmal atrial fibrillation Rate controlled.  On Coumadin for anticoagulation.  Patient follows up with Dr. Lovena Le and Dr. Tamala Julian from cardiology as OP.  Latest INR of 2.3.   Essential hypertension with hypertensive urgency:  Elevated blood pressure on presentation likely secondary to pain.    Blood pressure is better at this time after pain control.  Not on medications at home.   Chronic diastolic CHF: Continue Lasix from home.   Stage 3a chronic kidney disease: Creatinine of 1.1 today.  Likely at baseline.  Consultants: Ophthalmology  Procedures performed: None  Disposition: Home.  Spoke with the patient's son prior to disposition.  Diet recommendation:  Discharge Diet Orders (From admission, onward)     Start     Ordered   08/13/22 0000  Diet general        08/13/22 1057           Regular diet DISCHARGE MEDICATION: Allergies as of 08/13/2022       Reactions   Sulfa Antibiotics Other (See Comments)   Weakness Dizziness  Sweats   Mysoline [primidone] Other (See  Comments)   Sedation    Zocor [simvastatin] Other (See Comments)   Arthralgias Fatigue        Medication List     STOP taking these medications    timolol 0.5 % ophthalmic solution Commonly known as: TIMOPTIC   traMADol 50 MG tablet Commonly known as: ULTRAM       TAKE these medications     acetaminophen 325 MG tablet Commonly known as: TYLENOL Take 2 tablets (650 mg total) by mouth every 4 (four) hours as needed for mild pain (or temp > 37.5 C (99.5 F)).   atropine 1 % ophthalmic solution Place 1 drop into the right eye 2 (two) times daily.   brimonidine 0.2 % ophthalmic solution Commonly known as: ALPHAGAN Place 1 drop into the right eye every 8 (eight) hours.   cyanocobalamin 1000 MCG tablet Commonly known as: VITAMIN B12 Take 1 tablet (1,000 mcg total) by mouth every other day. What changed:  when to take this additional instructions   dorzolamide-timolol 22.3-6.8 MG/ML ophthalmic solution Commonly known as: COSOPT Place 1 drop into the right eye 2 (two) times daily.   DULoxetine 60 MG capsule Commonly known as: Cymbalta Take 1 capsule (60 mg total) by mouth daily.   furosemide 20 MG tablet Commonly known as: LASIX Take 2 tablets (40 mg total) by mouth daily. What changed: how much to take   levothyroxine 25 MCG tablet Commonly known as: SYNTHROID Take 1 tablet (25 mcg total) by mouth daily before breakfast.   loratadine 10 MG tablet Commonly known as: CLARITIN Take 10 mg by mouth daily.   Milk of Magnesia 400 MG/5ML suspension Generic drug: magnesium hydroxide Take 30 mLs by mouth daily as needed for mild constipation.   multivitamin with minerals Tabs tablet Take 1 tablet by mouth daily.   nitrofurantoin (macrocrystal-monohydrate) 100 MG capsule Commonly known as: MACROBID Take 100 mg by mouth daily. Continuous course.   Ocuvite Adult 50+ Caps Take 1 capsule by mouth daily.   oxyCODONE 5 MG immediate release tablet Commonly known as: Oxy IR/ROXICODONE Take 1 tablet (5 mg total) by mouth every 4 (four) hours as needed for up to 5 days for moderate pain or severe pain.   polyethylene glycol 17 g packet Commonly known as: MIRALAX / GLYCOLAX Take 17 g by mouth daily. What changed:  when to take this reasons to take this    prednisoLONE acetate 1 % ophthalmic suspension Commonly known as: PRED FORTE Place 1 drop into the right eye 2 (two) times daily.   pregabalin 50 MG capsule Commonly known as: LYRICA Take 50 mg by mouth at bedtime.   primidone 250 MG tablet Commonly known as: MYSOLINE Take 1 tablet (250 mg total) by mouth 2 (two) times daily.   topiramate 50 MG tablet Commonly known as: TOPAMAX Take 1 tablet (50 mg total) by mouth 2 (two) times daily.   vitamin C 1000 MG tablet Take 1,000 mg by mouth daily.   warfarin 5 MG tablet Commonly known as: COUMADIN Take as directed. If you are unsure how to take this medication, talk to your nurse or doctor. Original instructions: TAKE 1 AND 1/2 TO 2 TABLETS BY MOUTH ONCE DAILY OR AS DIRECTED BY COUMADIN CLINIC What changed:  how much to take how to take this when to take this additional instructions        Follow-up Information     Josetta Huddle, MD Follow up in 1 week(s).   Specialty: Internal Medicine Contact information:  [place] [place] [employer] [place] [address] [phone]         Belva Crome, MD .   Specialty: Cardiology Contact information: [phone] [place] [address] [phone]         Evans Lance, MD .   Specialty: Cardiology Contact information: [phone] [place] [address] [phone]                Subjective. Today, patient was seen and examined at bedside.  Hard of hearing.  Elderly frail patient with tearful eye.  Denies overt pain.  Discharge Exam: Filed Weights   08/10/22 1244  Weight: 89.6 kg      08/13/2022    7:22 AM 08/12/2022    7:24 PM 08/12/2022    8:41 AM  Vitals with BMI  Systolic 546 568 127  Diastolic 67 60 65  Pulse 70 69 69   Body mass index is 29.17 kg/m.   General:  Average built, not in obvious distress elderly male, he is hard of hearing, frail appearing. HENT:   No scleral pallor or icterus noted.   Tearful right eye. Chest: Clear CVS: S1 &S2 heard. No murmur.  Regular rate and rhythm. Abdomen: Soft, nontender, nondistended.  Bowel sounds are heard.   Extremities: No cyanosis, clubbing or edema.  Peripheral pulses are palpable. Psych: Alert, awake and communicative CNS: Moving extremities. Skin: Warm and dry.  No rashes noted.   Condition at discharge: fair  The results of significant diagnostics from this hospitalization (including imaging, microbiology, ancillary and laboratory) are listed below for reference.   Imaging Studies: CT Orbits W Contrast  Result Date: 08/09/2022 CLINICAL DATA:  Retinal disorder. EXAM: CT ORBITS WITH CONTRAST TECHNIQUE: Multidetector CT images was performed according to the standard protocol following intravenous contrast administration. RADIATION DOSE REDUCTION: This exam was performed according to the departmental dose-optimization program which includes automated exposure control, adjustment of the mA and/or kV according to patient size and/or use of iterative reconstruction technique. CONTRAST:  73m OMNIPAQUE IOHEXOL 300 MG/ML  SOLN COMPARISON:  None Available. FINDINGS: Orbits: A large amount heterogeneous increased attenuation (approximately 90.04 Hounsfield units) is seen within the right globe. The left globe is unremarkable. No evidence of orbital inflammation. Normal appearance of the optic nerve-sheath complexes, extraocular muscles, orbital fat and lacrimal glands. Visible paranasal sinuses: Clear. Soft tissues: Normal. Osseous: No fracture or aggressive lesion. Limited intracranial: No acute or significant finding. IMPRESSION: Large amount of hemorrhagic material within the right globe. Correlation with ophthalmologic examination is recommended. Electronically Signed   By: TVirgina NorfolkM.D.   On: 08/09/2022 23:25   CT Head Wo Contrast  Result Date: 08/09/2022 CLINICAL DATA:  Headache. EXAM: CT HEAD WITHOUT CONTRAST TECHNIQUE: Contiguous axial  images were obtained from the base of the skull through the vertex without intravenous contrast. RADIATION DOSE REDUCTION: This exam was performed according to the departmental dose-optimization program which includes automated exposure control, adjustment of the mA and/or kV according to patient size and/or use of iterative reconstruction technique. COMPARISON:  July 02, 2021 FINDINGS: Brain: There is moderate severity cerebral atrophy with widening of the extra-axial spaces and ventricular dilatation. There are areas of decreased attenuation within the white matter tracts of the supratentorial brain, consistent with microvascular disease changes. A chronic left frontal parietal infarct is seen. Vascular: No hyperdense vessel or unexpected calcification. Skull: Normal. Negative for fracture or focal lesion. Sinuses/Orbits: A large amount of heterogeneous, hyperdense  material (approximately 63.18 Hounsfield units) is seen within the right globe. This represents a new finding when compared to the prior study. Other: None. IMPRESSION: 1. Large amount of hemorrhagic material within the right globe. This represents a new finding when compared to the prior study and may represent sequelae associated with retinal detachment. Ophthalmology consult is recommended. 2. Chronic left frontal parietal infarct. 3. Generalized cerebral atrophy and chronic white matter small vessel ischemic changes, without evidence of an acute intracranial abnormality. Electronically Signed   By: Virgina Norfolk M.D.   On: 08/09/2022 23:20   Color Fundus Photography Optos - OU - Both Eyes  Result Date: 08/08/2022 Right Eye Macula : retinal pigment epithelium abnormalities. Left Eye Progression has been stable. Disc findings include normal observations. Macula : drusen, geographic atrophy, retinal pigment epithelium abnormalities. Notes No view OS, stable  OCT, Retina - OU - Both Eyes  Result Date: 08/05/2022 Left Eye Quality was poor. Scan  locations included subfoveal. Progression has been stable. Findings include abnormal foveal contour, disciform scar, outer retinal atrophy.   Color Fundus Photography Optos - OU - Both Eyes  Result Date: 08/05/2022 Right Eye Macula : retinal pigment epithelium abnormalities. Left Eye Progression has been stable. Disc findings include normal observations. Macula : drusen, geographic atrophy, retinal pigment epithelium abnormalities. Notes Macular disciform scars, geographic retinal pigment epithelial atrophy in left eye eye centrally no change in stable OD no view posteriorly apparent dense vitreous hemorrhage  CUP PACEART INCLINIC DEVICE CHECK  Result Date: 08/02/2022 Pacemaker check in clinic. Normal device function. Thresholds, sensing, impedances consistent with previous measurements. Device programmed to maximize longevity. 1 NS-VT previously noted on remote. Device programmed at appropriate safety margins. Histogram distribution appropriate for patient activity level. Device programmed to optimize intrinsic conduction. Estimated longevity 1.7 years Patient enrolled in remote follow-up. Patient education completed.Theodoro Doing BSN,RN,CCDS  CUP PACEART REMOTE DEVICE CHECK  Result Date: 07/18/2022 Scheduled remote reviewed. Normal device function.  Next remote 91 days. Kathy Breach, RN, CCDS, [employer]   Microbiology: Results for orders placed or performed during the hospital encounter of 08/09/22  Urine Culture     Status: Abnormal   Collection Time: 08/10/22  1:44 AM   Specimen: Urine, Catheterized  Result Value Ref Range Status   Specimen Description URINE, CATHETERIZED  Final   Special Requests   Final    NONE Performed at [facility], [address], [place], [place] [address]    Culture MULTIPLE SPECIES PRESENT, SUGGEST RECOLLECTION (A)  Final   Report Status 08/11/2022 FINAL  Final    Labs: CBC: Recent Labs  Lab 08/09/22 2055 08/10/22 0453 08/11/22 0222   WBC 8.9 8.6 8.4  HGB 11.2* 11.2* 10.5*  HCT 32.9* 32.0* 30.0*  MCV 97.1 95.8 94.6  PLT 93* 81* 84*   Basic Metabolic Panel: Recent Labs  Lab 08/09/22 2055 08/10/22 0453 08/11/22 0222  NA 134* 134* 131*  K 3.9 4.0 4.1  CL 97* 98 98  CO2 '27 25 24  '$ GLUCOSE 108* 107* 105*  BUN '22 20 16  '$ CREATININE 1.29* 1.27* 1.18  CALCIUM 9.4 9.0 8.8*   Liver Function Tests: No results for input(s): "AST", "ALT", "ALKPHOS", "BILITOT", "PROT", "ALBUMIN" in the last 168 hours. CBG: No results for input(s): "GLUCAP" in the last 168 hours.  Discharge time spent: greater than 30 minutes.  Signed: Flora Lipps, MD [facility] 08/13/2022

## 2022-08-13 NOTE — Progress Notes (Signed)
Mobility Specialist - Progress Note     08/13/22 1100  Mobility  Activity Ambulated with assistance in hallway  Level of Assistance Contact guard assist, steadying assist  Assistive Device Front wheel walker  Distance Ambulated (ft) 70 ft  Activity Response Tolerated well  $Mobility charge 1 Mobility   Pt was agreeable to mobilize was found in bed. No complaints while ambulating. Returned to bed with all necessities in reach and bed alarm on.   Ferd Hibbs Mobility Specialist

## 2022-08-13 NOTE — Progress Notes (Signed)
Discharge AVS printed and reviewed with Lynford Citizen and patient. All questions/concerns addressed. Peripheral IV removed. Discharged home via private auto

## 2022-08-15 ENCOUNTER — Encounter: Payer: Self-pay | Admitting: Interventional Cardiology

## 2022-08-15 DIAGNOSIS — H31311 Expulsive choroidal hemorrhage, right eye: Secondary | ICD-10-CM | POA: Diagnosis not present

## 2022-08-15 DIAGNOSIS — I4891 Unspecified atrial fibrillation: Secondary | ICD-10-CM | POA: Diagnosis not present

## 2022-08-15 DIAGNOSIS — H5711 Ocular pain, right eye: Secondary | ICD-10-CM | POA: Diagnosis not present

## 2022-08-15 DIAGNOSIS — H0270 Unspecified degenerative disorders of eyelid and periocular area: Secondary | ICD-10-CM | POA: Diagnosis not present

## 2022-08-15 DIAGNOSIS — H31411 Hemorrhagic choroidal detachment, right eye: Secondary | ICD-10-CM | POA: Diagnosis not present

## 2022-08-15 DIAGNOSIS — I480 Paroxysmal atrial fibrillation: Secondary | ICD-10-CM

## 2022-08-15 DIAGNOSIS — D696 Thrombocytopenia, unspecified: Secondary | ICD-10-CM | POA: Diagnosis not present

## 2022-08-19 MED ORDER — APIXABAN 5 MG PO TABS: 5.0000 mg | ORAL_TABLET | Freq: Two times a day (BID) | ORAL | 11 refills | Status: DC

## 2022-08-19 MED ORDER — WARFARIN SODIUM 5 MG PO TABS: 7.5000 mg | ORAL_TABLET | ORAL | Status: DC

## 2022-08-19 NOTE — Addendum Note (Signed)
Addended by: Molli Barrows on: 08/19/2022 10:18 AM   Modules accepted: Orders

## 2022-08-19 NOTE — Addendum Note (Signed)
Addended by: Molli Barrows on: 08/19/2022 09:28 AM   Modules accepted: Orders

## 2022-08-21 ENCOUNTER — Telehealth: Payer: Self-pay

## 2022-08-21 DIAGNOSIS — R338 Other retention of urine: Secondary | ICD-10-CM | POA: Diagnosis not present

## 2022-08-21 NOTE — Telephone Encounter (Signed)
Received call from pt's son stating pt has switched to Eliquis and wanted to see if pt should still come to [facility] appt this afternoon.   Per note on 08/15/22, Dr Tamala Julian approved and is aware of the pt switching to Eliquis.   Made pt's son aware that pt does not need to come to [facility]madin Clinic.

## 2022-08-23 DIAGNOSIS — H5711 Ocular pain, right eye: Secondary | ICD-10-CM | POA: Diagnosis not present

## 2022-08-23 DIAGNOSIS — Z8551 Personal history of malignant neoplasm of bladder: Secondary | ICD-10-CM | POA: Diagnosis not present

## 2022-08-23 DIAGNOSIS — M25569 Pain in unspecified knee: Secondary | ICD-10-CM | POA: Diagnosis not present

## 2022-08-24 ENCOUNTER — Encounter: Payer: Self-pay | Admitting: Interventional Cardiology

## 2022-08-26 ENCOUNTER — Encounter (INDEPENDENT_AMBULATORY_CARE_PROVIDER_SITE_OTHER): Payer: Medicare Other | Admitting: Ophthalmology

## 2022-08-26 ENCOUNTER — Encounter: Payer: Self-pay | Admitting: Internal Medicine

## 2022-08-26 ENCOUNTER — Telehealth: Payer: Self-pay | Admitting: *Deleted

## 2022-08-26 NOTE — Telephone Encounter (Addendum)
   Name: Douglas Walker DOB: April 10, 1925  MRN: [id number]  Primary Cardiologist: Sinclair Grooms, MD  Chart reviewed as part of pre-operative protocol coverage.   The patient has a history of atrial fibrillation, HFpEF, pulm hypertension, ascending aortic aneurysm, sick sinus syndrome status post pacemaker, mild aortic insufficiency, hypertension, hyperlipidemia, prior stroke.  He was last seen by Melina Copa, PA-C on 10/31/2021 and Dr. Lovena Le on 08/02/2022.  He is in need of emergent eye surgery tomorrow.  Dr. Tamala Julian is already provided instructions to hold Eliquis for 48 hours prior to his procedure.  His ophthalmologist recommended stopping it after Sunday.  Today was his first day without taking Eliquis.  Patient was contacted 08/26/2022 in reference to pre-operative risk assessment for pending surgery as outlined below.    The patient could not come to the phone.  I spoke with [name], his son.  DPR is on file.  Since last seen, [name] has done well without chest pain, shortness of breath or syncope.    Recommendations: His surgery is fairly urgent. He has not had any unstable symptoms recently. The patient may proceed without further cardiovascular testing.  He can hold Eliquis for 48 hours prior to his procedure and resume post op when it is felt to be safe by the surgeon.    Please call with questions. [name], PA-C 08/26/2022, 4:09 PM

## 2022-08-26 NOTE — Progress Notes (Signed)
PERIOPERATIVE PRESCRIPTION FOR IMPLANTED CARDIAC DEVICE PROGRAMMING  Patient Information: Name:  [name]  DOB:  1925/11/01  MRN:  [id number]    Procedure:   RIGHT EYE EVISCERATION WITH IMPLANT AND RIGHT TEMPORARY TARSORRHAPHY   Date of Surgery:  Clearance 08/27/22 URGENT                                Surgeon:  DR. TAL RUBINSTEIN Surgeon's Group or Practice Name:  [facility]  Phone number:  [phone] Fax number:  Procedure:   RIGHT EYE EVISCERATION WITH IMPLANT AND RIGHT TEMPORARY TARSORRHAPHY   Date of Surgery:  Clearance 08/27/22 URGENT                                Procedure:   RIGHT EYE EVISCERATION WITH IMPLANT AND RIGHT TEMPORARY TARSORRHAPHY                              Phone number:  [phone] Fax number:  [phone] Device Information:  Clinic EP Physician:  [name], MD   Device Type:  Pacemaker Manufacturer and Phone #:  [employer]: [id number] Pacemaker Dependent?:  Yes.   Date of Last Device Check:  08/02/2022 Normal Device Function?:  Yes.    Electrophysiologist's Recommendations:  Have magnet available. Provide continuous ECG monitoring when magnet is used or reprogramming is to be performed.  Procedure may interfere with device function.  Magnet should be placed over device during procedure.  Per Device Clinic Standing Orders, Wanda Plump, RN  1:19 PM 08/26/2022

## 2022-08-26 NOTE — Telephone Encounter (Signed)
   Pre-operative Risk Assessment    Patient Name: Douglas Walker  DOB: [birth year] MRN: [id number]      Request for Surgical Clearance    Procedure:   RIGHT EYE EVISCERATION WITH IMPLANT AND RIGHT TEMPORARY TARSORRHAPHY  Date of Surgery:  Clearance 08/27/22 URGENT                                Surgeon:  DR. TAL RUBINSTEIN Surgeon's Group or Practice Name:  [facility]  Phone number:  [phone] Fax number:  [phone]   Type of Clearance Requested:   - Medical  - Pharmacy:  Hold Warfarin (Coumadin) PER CLEARANCE REQUEST THE PT WILL ALSO NEED DEVICE CLEARANCE AS WELL. I WILL SEND THIS TO DEVICE CLINIC AS WELL.    Type of Anesthesia:  General    Additional requests/questions:    Jiles Prows   08/26/2022, 12:46 PM

## 2022-08-26 NOTE — Telephone Encounter (Signed)
Notes faxed to surgeon. This phone note will be removed from the preop pool. [name], PA-C  08/26/2022 4:14 PM

## 2022-08-27 DIAGNOSIS — Z8673 Personal history of transient ischemic attack (TIA), and cerebral infarction without residual deficits: Secondary | ICD-10-CM | POA: Diagnosis not present

## 2022-08-27 DIAGNOSIS — H189 Unspecified disorder of cornea: Secondary | ICD-10-CM | POA: Diagnosis not present

## 2022-08-27 DIAGNOSIS — N183 Chronic kidney disease, stage 3 unspecified: Secondary | ICD-10-CM | POA: Diagnosis not present

## 2022-08-27 DIAGNOSIS — H54415A Blindness right eye category 5, normal vision left eye: Secondary | ICD-10-CM | POA: Diagnosis not present

## 2022-08-27 DIAGNOSIS — J449 Chronic obstructive pulmonary disease, unspecified: Secondary | ICD-10-CM | POA: Diagnosis not present

## 2022-08-27 DIAGNOSIS — E039 Hypothyroidism, unspecified: Secondary | ICD-10-CM | POA: Diagnosis not present

## 2022-08-27 DIAGNOSIS — H54413A Blindness right eye category 3, normal vision left eye: Secondary | ICD-10-CM | POA: Diagnosis not present

## 2022-08-27 DIAGNOSIS — Z7901 Long term (current) use of anticoagulants: Secondary | ICD-10-CM | POA: Diagnosis not present

## 2022-08-27 DIAGNOSIS — H31311 Expulsive choroidal hemorrhage, right eye: Secondary | ICD-10-CM | POA: Diagnosis not present

## 2022-08-27 DIAGNOSIS — I5032 Chronic diastolic (congestive) heart failure: Secondary | ICD-10-CM | POA: Diagnosis not present

## 2022-08-27 DIAGNOSIS — H31411 Hemorrhagic choroidal detachment, right eye: Secondary | ICD-10-CM | POA: Diagnosis not present

## 2022-08-27 DIAGNOSIS — H5711 Ocular pain, right eye: Secondary | ICD-10-CM | POA: Diagnosis not present

## 2022-08-27 DIAGNOSIS — Z87891 Personal history of nicotine dependence: Secondary | ICD-10-CM | POA: Diagnosis not present

## 2022-08-27 DIAGNOSIS — I130 Hypertensive heart and chronic kidney disease with heart failure and stage 1 through stage 4 chronic kidney disease, or unspecified chronic kidney disease: Secondary | ICD-10-CM | POA: Diagnosis not present

## 2022-08-27 DIAGNOSIS — Z882 Allergy status to sulfonamides status: Secondary | ICD-10-CM | POA: Diagnosis not present

## 2022-08-27 DIAGNOSIS — Z79899 Other long term (current) drug therapy: Secondary | ICD-10-CM | POA: Diagnosis not present

## 2022-08-27 DIAGNOSIS — E785 Hyperlipidemia, unspecified: Secondary | ICD-10-CM | POA: Diagnosis not present

## 2022-08-29 ENCOUNTER — Emergency Department (HOSPITAL_COMMUNITY): Payer: Medicare Other

## 2022-08-29 ENCOUNTER — Other Ambulatory Visit: Payer: Self-pay

## 2022-08-29 ENCOUNTER — Encounter (HOSPITAL_COMMUNITY): Payer: Self-pay

## 2022-08-29 ENCOUNTER — Emergency Department (HOSPITAL_COMMUNITY)
Admission: EM | Admit: 2022-08-29 | Discharge: 2022-08-29 | Disposition: A | Discharge status: Home / Self Care | Payer: Medicare Other | Attending: Emergency Medicine | Admitting: Emergency Medicine

## 2022-08-29 DIAGNOSIS — H5711 Ocular pain, right eye: Secondary | ICD-10-CM | POA: Insufficient documentation

## 2022-08-29 DIAGNOSIS — R778 Other specified abnormalities of plasma proteins: Secondary | ICD-10-CM | POA: Insufficient documentation

## 2022-08-29 DIAGNOSIS — I130 Hypertensive heart and chronic kidney disease with heart failure and stage 1 through stage 4 chronic kidney disease, or unspecified chronic kidney disease: Secondary | ICD-10-CM | POA: Diagnosis not present

## 2022-08-29 DIAGNOSIS — Z8673 Personal history of transient ischemic attack (TIA), and cerebral infarction without residual deficits: Secondary | ICD-10-CM | POA: Diagnosis not present

## 2022-08-29 DIAGNOSIS — I701 Atherosclerosis of renal artery: Secondary | ICD-10-CM | POA: Diagnosis not present

## 2022-08-29 DIAGNOSIS — N1831 Chronic kidney disease, stage 3a: Secondary | ICD-10-CM | POA: Diagnosis not present

## 2022-08-29 DIAGNOSIS — R410 Disorientation, unspecified: Secondary | ICD-10-CM | POA: Diagnosis not present

## 2022-08-29 DIAGNOSIS — Z950 Presence of cardiac pacemaker: Secondary | ICD-10-CM | POA: Insufficient documentation

## 2022-08-29 DIAGNOSIS — I4891 Unspecified atrial fibrillation: Secondary | ICD-10-CM | POA: Insufficient documentation

## 2022-08-29 DIAGNOSIS — R5381 Other malaise: Secondary | ICD-10-CM

## 2022-08-29 DIAGNOSIS — J80 Acute respiratory distress syndrome: Secondary | ICD-10-CM | POA: Diagnosis not present

## 2022-08-29 DIAGNOSIS — R079 Chest pain, unspecified: Secondary | ICD-10-CM | POA: Diagnosis not present

## 2022-08-29 DIAGNOSIS — Z85828 Personal history of other malignant neoplasm of skin: Secondary | ICD-10-CM | POA: Insufficient documentation

## 2022-08-29 DIAGNOSIS — Z87891 Personal history of nicotine dependence: Secondary | ICD-10-CM | POA: Diagnosis not present

## 2022-08-29 DIAGNOSIS — Z7901 Long term (current) use of anticoagulants: Secondary | ICD-10-CM | POA: Diagnosis not present

## 2022-08-29 DIAGNOSIS — R0602 Shortness of breath: Secondary | ICD-10-CM | POA: Diagnosis not present

## 2022-08-29 DIAGNOSIS — E039 Hypothyroidism, unspecified: Secondary | ICD-10-CM | POA: Diagnosis not present

## 2022-08-29 DIAGNOSIS — R791 Abnormal coagulation profile: Secondary | ICD-10-CM | POA: Diagnosis not present

## 2022-08-29 DIAGNOSIS — K5730 Diverticulosis of large intestine without perforation or abscess without bleeding: Secondary | ICD-10-CM | POA: Diagnosis not present

## 2022-08-29 DIAGNOSIS — Z79899 Other long term (current) drug therapy: Secondary | ICD-10-CM | POA: Diagnosis not present

## 2022-08-29 DIAGNOSIS — R531 Weakness: Secondary | ICD-10-CM | POA: Diagnosis not present

## 2022-08-29 DIAGNOSIS — I5032 Chronic diastolic (congestive) heart failure: Secondary | ICD-10-CM | POA: Diagnosis not present

## 2022-08-29 DIAGNOSIS — I2510 Atherosclerotic heart disease of native coronary artery without angina pectoris: Secondary | ICD-10-CM | POA: Diagnosis not present

## 2022-08-29 DIAGNOSIS — I700 Atherosclerosis of aorta: Secondary | ICD-10-CM | POA: Diagnosis not present

## 2022-08-29 DIAGNOSIS — I712 Thoracic aortic aneurysm, without rupture, unspecified: Secondary | ICD-10-CM | POA: Diagnosis not present

## 2022-08-29 LAB — URINALYSIS, ROUTINE W REFLEX MICROSCOPIC
Bilirubin Urine: NEGATIVE
Glucose, UA: NEGATIVE mg/dL
Hgb urine dipstick: NEGATIVE
Ketones, ur: NEGATIVE mg/dL
Nitrite: POSITIVE — AB
Protein, ur: NEGATIVE mg/dL
Specific Gravity, Urine: 1.005 (ref 1.005–1.030)
pH: 7 (ref 5.0–8.0)

## 2022-08-29 LAB — PROTIME-INR
INR: 1.1 (ref 0.8–1.2)
Prothrombin Time: 14 seconds (ref 11.4–15.2)

## 2022-08-29 LAB — CBC WITH DIFFERENTIAL/PLATELET
Abs Immature Granulocytes: 0.04 10*3/uL (ref 0.00–0.07)
Basophils Absolute: 0 10*3/uL (ref 0.0–0.1)
Basophils Relative: 0 %
Eosinophils Absolute: 0 10*3/uL (ref 0.0–0.5)
Eosinophils Relative: 0 %
HCT: 31.4 % — ABNORMAL LOW (ref 39.0–52.0)
Hemoglobin: 10.7 g/dL — ABNORMAL LOW (ref 13.0–17.0)
Immature Granulocytes: 1 %
Lymphocytes Relative: 20 %
Lymphs Abs: 1.5 10*3/uL (ref 0.7–4.0)
MCH: 32.5 pg (ref 26.0–34.0)
MCHC: 34.1 g/dL (ref 30.0–36.0)
MCV: 95.4 fL (ref 80.0–100.0)
Monocytes Absolute: 0.7 10*3/uL (ref 0.1–1.0)
Monocytes Relative: 10 %
Neutro Abs: 5.2 10*3/uL (ref 1.7–7.7)
Neutrophils Relative %: 69 %
Platelets: 104 10*3/uL — ABNORMAL LOW (ref 150–400)
RBC: 3.29 MIL/uL — ABNORMAL LOW (ref 4.22–5.81)
RDW: 14.6 % (ref 11.5–15.5)
WBC: 7.5 10*3/uL (ref 4.0–10.5)
nRBC: 0 % (ref 0.0–0.2)

## 2022-08-29 LAB — I-STAT VENOUS BLOOD GAS, ED
Acid-Base Excess: 5 mmol/L — ABNORMAL HIGH (ref 0.0–2.0)
Bicarbonate: 29.5 mmol/L — ABNORMAL HIGH (ref 20.0–28.0)
Calcium, Ion: 1.19 mmol/L (ref 1.15–1.40)
HCT: 32 % — ABNORMAL LOW (ref 39.0–52.0)
Hemoglobin: 10.9 g/dL — ABNORMAL LOW (ref 13.0–17.0)
O2 Saturation: 80 %
Potassium: 4.2 mmol/L (ref 3.5–5.1)
Sodium: 137 mmol/L (ref 135–145)
TCO2: 31 mmol/L (ref 22–32)
pCO2, Ven: 42.5 mmHg — ABNORMAL LOW (ref 44–60)
pH, Ven: 7.449 — ABNORMAL HIGH (ref 7.25–7.43)
pO2, Ven: 43 mmHg (ref 32–45)

## 2022-08-29 LAB — D-DIMER, QUANTITATIVE: D-Dimer, Quant: 0.87 ug/mL-FEU — ABNORMAL HIGH (ref 0.00–0.50)

## 2022-08-29 LAB — BASIC METABOLIC PANEL
Anion gap: 12 (ref 5–15)
BUN: 25 mg/dL — ABNORMAL HIGH (ref 8–23)
CO2: 27 mmol/L (ref 22–32)
Calcium: 9.6 mg/dL (ref 8.9–10.3)
Chloride: 99 mmol/L (ref 98–111)
Creatinine, Ser: 1.45 mg/dL — ABNORMAL HIGH (ref 0.61–1.24)
GFR, Estimated: 44 mL/min — ABNORMAL LOW (ref >60)
Glucose, Bld: 96 mg/dL (ref 70–99)
Potassium: 4.3 mmol/L (ref 3.5–5.1)
Sodium: 138 mmol/L (ref 135–145)

## 2022-08-29 LAB — BRAIN NATRIURETIC PEPTIDE: B Natriuretic Peptide: 357.1 pg/mL — ABNORMAL HIGH (ref 0.0–100.0)

## 2022-08-29 LAB — TROPONIN I (HIGH SENSITIVITY)
Troponin I (High Sensitivity): 30 ng/L — ABNORMAL HIGH (ref <18)
Troponin I (High Sensitivity): 30 ng/L — ABNORMAL HIGH (ref <18)

## 2022-08-29 MED ORDER — IOHEXOL 350 MG/ML SOLN
80.0000 mL | Freq: Once | INTRAVENOUS | Status: AC | PRN
  Administered 2022-08-29: 80 mL via INTRAVENOUS

## 2022-08-29 NOTE — ED Notes (Signed)
Family updated as to patient's status.

## 2022-08-29 NOTE — Assessment & Plan Note (Signed)
After speaking at length with the patient's son and daughter-in-law, I think the patient is stable for discharge.  Troponins are flat.  No evidence of PE on CTPA.  He is already on Eliquis.  I think the patient's shortness of breath is likely due to a combination of recent weight loss due to pain and lack of eating over the last 2 weeks due to his choroidal hemorrhage in his right eye.  Also due to the lack of his regular physical activity due to continued pain.  Family states that they have been giving him pain medicine over the last 2 weeks due to his severe eye pain.  They have stopped giving him his pain medicine since his eye surgery.  No concern for opiate withdrawal.  In addition, patient recently started on some corticosteroids that was prescribed by his ophthalmologist.  This could also affect his sleep-wake cycle.  Stressed to the family that they should try to fit in all his steroid doses during his waking hours and avoid steroid administration within 6 hours of his bedtime.  Interestingly, the son states that the patient is late wife used to tell him that he needed to suffer before he died.  Family states that today when he woke up, the patient had no complaints of any eye pain.  Son seems to think that patient interpreted his lack of pain to mean that he had already died.  It seems possible based on the history.  Overall I do not think that inpatient admission would benefit this patient.  Given his late age at 23, he would not be a candidate for any aggressive cardiac intervention anyway.  Discussed with the family that patient may benefit from physical therapy but they did not think that it would help him at his age of 47.  I agree with this.  Patient can also be referred to physical therapy by his PCP if needed.  I have sent a secure chat to the ED provider telling them that the patient is stable for discharge.

## 2022-08-29 NOTE — Discharge Instructions (Addendum)
We evaluated you in the emergency department for your shortness of breath.  We did not find sign of a pneumonia, collapsed lung, or fluid in the lung to explain your shortness of breath.  Your symptoms did not go along with a an exacerbation of emphysema.  We talked to the hospitalist about your symptoms and results, and he agreed that it is safe to go home.  Please follow-up closely with your primary care physician.  If you develop any new or worsening symptoms such as fever, fainting, severe pain, please return to the emergency department.

## 2022-08-29 NOTE — Subjective & Objective (Signed)
Chief complaint: Shortness of breath History present illness: 88 year old male history of A-fib, was on Coumadin but now on Eliquis with a recent right eye hemorrhage about 2 to 3 weeks ago.  He required inpatient admission 2 weeks ago due to severe right eye pain.  He has been seen by ophthalmology.  It was determined that vision salvage in his eye with the hemorrhage was not possible.  He underwent eye enucleation at [facility] on 08/27/2022.  Patient has had continued pain in the right side of his head since then.  Family called his ophthalmologist who prescribed a Medrol Dosepak that was started yesterday.  Today when the patient was getting out of bed to have his chronic Foley catheter draining, they noticed him being very short of breath when he stood up so his catheter could be drained.  This led them to be concerned.  They called EMS and the patient was brought to the ER.  Work-up in the ER was essentially negative for causes of his shortness of breath.  CTPA was negative for PE.  2 sets of troponins were 30.  His EKG shows a paced rhythm.  He has some mild dehydration with a BUN of 25 and a creatinine of 1.45.  Family states that he has not been eating or drinking well for several weeks.  Due to his constellation of symptoms, [facility] hospitalist contacted for consultation.

## 2022-08-29 NOTE — ED Triage Notes (Signed)
Pt BIB [facility] from home d/t c/o SOB (per family), EMS reports he shows no distress but has also been showing a little more confusion that usual. He recently had his Rt eye removed & was taken off his thinners for the procedure & restarted them yesterday. His lung sounds were clear/equal/bilateral. HOH, 148/84, 96% on RA, 70 bpm. Does have a Hx of Lt sided deficits from a Stroke in 2002.

## 2022-08-29 NOTE — Consult Note (Signed)
History and Physical    CASMER YEPIZ GDJ:[id number] DOB: [birth year] DOA: 08/29/2022  DOS: the patient was seen and examined on 08/29/2022  PCP: Josetta Huddle, MD   Patient coming from: Home  I have personally briefly reviewed patient's old medical records in [place]  Chief complaint: Shortness of breath History present illness: 88 year old male history of A-fib, was on Coumadin but now on Eliquis with a recent right eye hemorrhage about 2 to 3 weeks ago.  He required inpatient admission 2 weeks ago due to severe right eye pain.  He has been seen by ophthalmology.  It was determined that vision salvage in his eye with the hemorrhage was not possible.  He underwent eye enucleation at [facility] on 08/27/2022.  Patient has had continued pain in the right side of his head since then.  Family called his ophthalmologist who prescribed a Medrol Dosepak that was started yesterday.  Today when the patient was getting out of bed to have his chronic Foley catheter draining, they noticed him being very short of breath when he stood up so his catheter could be drained.  This led them to be concerned.  They called EMS and the patient was brought to the ER.  Work-up in the ER was essentially negative for causes of his shortness of breath.  CTPA was negative for PE.  2 sets of troponins were 30.  His EKG shows a paced rhythm.  He has some mild dehydration with a BUN of 25 and a creatinine of 1.45.  Family states that he has not been eating or drinking well for several weeks.  Due to his constellation of symptoms, [facility] hospitalist contacted for consultation.   ED Course: Work-up in the ER shows a negative CTPA for PE.  Troponins are flat at 30.  Patient has chronic thrombocytopenia which is known.  Mildly dehydrated with a BUN of 25 and a creatinine 1.45.  UA is actually quite bland with specific gravity of 1.005.  This was taken from his chronic Foley catheter.  EKG on my  interpretation shows paced rhythm.  Review of Systems:  Review of Systems  Unable to perform ROS: Dementia    Past Medical History:  Diagnosis Date   Arthritis    shoulders and back   Ascending aorta dilatation (HCC)    Cancer (HCC)    skin cancers   Chronic diastolic (congestive) heart failure (HCC)    Chronic low back pain 12/15/2017   CKD (chronic kidney disease) stage 3, GFR 30-59 ml/min (HCC)    Dysrhythmia     Paroxysmal atrial fibrillation; Tachycardia-bradycardia syndrome   Essential tremor 02/28/2016   GERD (gastroesophageal reflux disease)    Hernia    History of hiatal hernia    Hyperlipemia    Hypertension    Macular degeneration    Neuromuscular disorder ([place])    neuropathy   Paroxysmal atrial fibrillation ([place]) 10/28/2013   Presence of permanent cardiac pacemaker 12/04/2018   Pulmonary hypertension ([place])    Stroke ([place]) 12/2020   Tachycardia-bradycardia syndrome ([place]) 09/13/2014   TIA (transient ischemic attack)     Past Surgical History:  Procedure Laterality Date   [place] / REPLACE / REMOVE PACEMAKER  12/04/2018   IRRIGATION AND DEBRIDEMENT ABSCESS Right 07/24/2020   Procedure: IRRIGATION AND DEBRIDEMENT HEMATOMA;  Surgeon: Coralie Keens, MD;  Location: [facility];  Service:  General;  Laterality: Right;   MASS EXCISION Right 07/20/2020   Procedure: EXCISION OF RIGHT CHEST WALL MASS;  Surgeon: Stark Klein, MD;  Location: [facility];  Service: General;  Laterality: Right;   PACEMAKER IMPLANT N/A 12/04/2018   Procedure: PACEMAKER IMPLANT;  Surgeon: Evans Lance, MD;  Location: [facility];  Service: Cardiovascular;  Laterality: N/A;     reports that he quit smoking about 59 years ago. His smoking use included cigarettes. He has never used smokeless tobacco. He reports that he does not drink alcohol and does not use drugs.  Allergies  Allergen Reactions   Sulfa Antibiotics Other  (See Comments)    Weakness Dizziness  Sweats    Mysoline [Primidone] Other (See Comments)    Sedation    Zocor [Simvastatin] Other (See Comments)    Arthralgias Fatigue    Family History  Problem Relation Age of Onset   GI problems Mother    Other Sister        PAIN ISSUES   Hearing loss Sister    Blindness Sister    Heart attack Neg Hx    Stroke Neg Hx     Prior to Admission medications   Medication Sig Start Date End Date Taking? Authorizing Provider  acetaminophen (TYLENOL) 325 MG tablet Take 2 tablets (650 mg total) by mouth every 4 (four) hours as needed for mild pain (or temp > 37.5 C (99.5 F)). 02/14/21   Angiulli, Lavon Paganini, PA-C  apixaban (ELIQUIS) 5 MG TABS tablet Take 1 tablet (5 mg total) by mouth 2 (two) times daily. 08/19/22   Belva Crome, MD  Ascorbic Acid (VITAMIN C) 1000 MG tablet Take 1,000 mg by mouth daily.    [provider]  atropine 1 % ophthalmic solution Place 1 drop into the right eye 2 (two) times daily. 08/05/22   Rankin, Clent Demark, MD  brimonidine (ALPHAGAN) 0.2 % ophthalmic solution Place 1 drop into the right eye every 8 (eight) hours. 08/08/22 08/08/23  Rankin, Clent Demark, MD  dorzolamide-timolol (COSOPT) 22.3-6.8 MG/ML ophthalmic solution Place 1 drop into the right eye 2 (two) times daily. 08/13/22   Pokhrel, Corrie Mckusick, MD  DULoxetine (CYMBALTA) 60 MG capsule Take 1 capsule (60 mg total) by mouth daily. 04/23/21   Lovorn, Jinny Blossom, MD  furosemide (LASIX) 20 MG tablet Take 2 tablets (40 mg total) by mouth daily. Patient taking differently: Take 60 mg by mouth daily. 02/14/21   Angiulli, Lavon Paganini, PA-C  levothyroxine (SYNTHROID) 25 MCG tablet Take 1 tablet (25 mcg total) by mouth daily before breakfast. 02/14/21   Angiulli, Lavon Paganini, PA-C  loratadine (CLARITIN) 10 MG tablet Take 10 mg by mouth daily.    [provider]  magnesium hydroxide (MILK OF MAGNESIA) 400 MG/5ML suspension Take 30 mLs by mouth daily as needed for mild constipation.    [provider]  Multiple Vitamin (MULTIVITAMIN WITH MINERALS) TABS tablet Take 1 tablet by mouth daily.    [provider]  Multiple Vitamins-Minerals (OCUVITE ADULT 50+) CAPS Take 1 capsule by mouth daily.    [provider]  nitrofurantoin, macrocrystal-monohydrate, (MACROBID) 100 MG capsule Take 100 mg by mouth daily. Continuous course.    [provider]  polyethylene glycol (MIRALAX / GLYCOLAX) 17 g packet Take 17 g by mouth daily. Patient taking differently: Take 17 g by mouth daily as needed for mild constipation. 07/08/21   Mariel Aloe, MD  prednisoLONE acetate (PRED FORTE) 1 % ophthalmic suspension Place  1 drop into the right eye 2 (two) times daily. 08/05/22   Rankin, Clent Demark, MD  pregabalin (LYRICA) 50 MG capsule Take 50 mg by mouth at bedtime.    [provider]  primidone (MYSOLINE) 250 MG tablet Take 1 tablet (250 mg total) by mouth 2 (two) times daily. 02/14/21   Angiulli, Lavon Paganini, PA-C  topiramate (TOPAMAX) 50 MG tablet Take 1 tablet (50 mg total) by mouth 2 (two) times daily. 02/27/22   Ward Givens, NP  vitamin B-12 (CYANOCOBALAMIN) 1000 MCG tablet Take 1 tablet (1,000 mcg total) by mouth every other day. Patient taking differently: Take 1,000 mcg by mouth See admin instructions. 1016mg oral daily on Sunday, Tuesday, Wednesday, Thursday, repeat. 02/14/21   ACathlyn Parsons PA-C    Physical Exam: Vitals:   08/29/22 1500 08/29/22 1503 08/29/22 1752  BP: 137/74  (!) 144/90  Pulse: 70  68  Resp: 16  18  Temp: 98.9 F (37.2 C)  98.1 F (36.7 C)  TempSrc: Oral  Oral  SpO2: 95% 96% 95%    Physical Exam Vitals and nursing note reviewed.  Constitutional:      General: He is not in acute distress.    Appearance: He is not ill-appearing.     Comments: Elderly white male.  Has a significant mouth tremor.  Family states that this is chronic.  HENT:     Head: Normocephalic.  Eyes:     Comments: Bulky dressing over his right orbital  socket.  Abdominal:     General: There is no distension.  Musculoskeletal:     Right lower leg: No edema.     Left lower leg: No edema.  Neurological:     Comments: Has a tremor of his face especially his lips.  He was able to drink 12 ounces of ice water through a straw without a problem.      Labs on Admission: I have personally reviewed following labs and imaging studies  CBC: Recent Labs  Lab 08/29/22 1545 08/29/22 1553  WBC 7.5  --   NEUTROABS 5.2  --   HGB 10.7* 10.9*  HCT 31.4* 32.0*  MCV 95.4  --   PLT 104*  --    Basic Metabolic Panel: Recent Labs  Lab 08/29/22 1545 08/29/22 1553  NA 138 137  K 4.3 4.2  CL 99  --   CO2 27  --   GLUCOSE 96  --   BUN 25*  --   CREATININE 1.45*  --   CALCIUM 9.6  --    GFR: CrCl cannot be calculated (Unknown ideal weight.). Liver Function Tests: No results for input(s): "AST", "ALT", "ALKPHOS", "BILITOT", "PROT", "ALBUMIN" in the last 168 hours. No results for input(s): "LIPASE", "AMYLASE" in the last 168 hours. No results for input(s): "AMMONIA" in the last 168 hours. Coagulation Profile: Recent Labs  Lab 08/29/22 1545  INR 1.1   Cardiac Enzymes: Recent Labs  Lab 08/29/22 1545 08/29/22 1725  TROPONINIHS 30* 30*   BNP (last 3 results) No results for input(s): "PROBNP" in the last 8760 hours. HbA1C: No results for input(s): "HGBA1C" in the last 72 hours. CBG: No results for input(s): "GLUCAP" in the last 168 hours. Lipid Profile: No results for input(s): "CHOL", "HDL", "LDLCALC", "TRIG", "CHOLHDL", "LDLDIRECT" in the last 72 hours. Thyroid Function Tests: No results for input(s): "TSH", "T4TOTAL", "FREET4", "T3FREE", "THYROIDAB" in the last 72 hours. Anemia Panel: No results for input(s): "VITAMINB12", "FOLATE", "FERRITIN", "TIBC", "IRON", "RETICCTPCT" in the last  72 hours. Urine analysis:    Component Value Date/Time   COLORURINE STRAW (A) 08/29/2022 1526   APPEARANCEUR CLEAR 08/29/2022 1526   LABSPEC  1.005 08/29/2022 1526   PHURINE 7.0 08/29/2022 1526   GLUCOSEU NEGATIVE 08/29/2022 1526   HGBUR NEGATIVE 08/29/2022 [place] 08/29/2022 1526   KETONESUR NEGATIVE 08/29/2022 1526   PROTEINUR NEGATIVE 08/29/2022 1526   UROBILINOGEN 0.2 06/18/2015 0441   NITRITE POSITIVE (A) 08/29/2022 1526   LEUKOCYTESUR LARGE (A) 08/29/2022 1526    Radiological Exams on Admission: I have personally reviewed images CT Angio Chest/Abd/Pel for Dissection W and/or W/WO  Result Date: 08/29/2022 CLINICAL DATA:  Acute aortic syndrome. EXAM: CT ANGIOGRAPHY CHEST, ABDOMEN AND PELVIS TECHNIQUE: Non-contrast CT of the chest was initially obtained. Multidetector CT imaging through the chest, abdomen and pelvis was performed using the standard protocol during bolus administration of intravenous contrast. Multiplanar reconstructed images and MIPs were obtained and reviewed to evaluate the vascular anatomy. RADIATION DOSE REDUCTION: This exam was performed according to the departmental dose-optimization program which includes automated exposure control, adjustment of the mA and/or kV according to patient size and/or use of iterative reconstruction technique. CONTRAST:  45m OMNIPAQUE IOHEXOL 350 MG/ML SOLN COMPARISON:  April 26, 2022.  July 02, 2021. FINDINGS: CTA CHEST FINDINGS Cardiovascular: 5.1 cm ascending thoracic aortic aneurysm is noted. Great vessels are widely patent. No dissection is noted. Normal cardiac size. No pericardial effusion. Extensive coronary artery calcifications are noted. Mediastinum/Nodes: No enlarged mediastinal, hilar, or axillary lymph nodes. Thyroid gland, trachea, and esophagus demonstrate no significant findings. Lungs/Pleura: Lungs are clear. No pleural effusion or pneumothorax. Musculoskeletal: No chest wall abnormality. No acute or significant osseous findings. Review of the MIP images confirms the above findings. CTA ABDOMEN AND PELVIS FINDINGS VASCULAR Aorta: Atherosclerosis of  abdominal aorta is noted without aneurysm or dissection. Celiac: Patent without evidence of aneurysm, dissection, vasculitis or significant stenosis. SMA: Patent without evidence of aneurysm, dissection, vasculitis or significant stenosis. Renals: Severe stenosis is seen involving the proximal portion of the left renal artery. Right renal artery is widely patent. IMA: Patent without evidence of aneurysm, dissection, vasculitis or significant stenosis. Inflow: Patent without evidence of aneurysm, dissection, vasculitis or significant stenosis. Veins: No obvious venous abnormality within the limitations of this arterial phase study. Review of the MIP images confirms the above findings. NON-VASCULAR Hepatobiliary: No gallstones or biliary dilatation is noted. Stable probable hepatic cysts. Pancreas: Unremarkable. No pancreatic ductal dilatation or surrounding inflammatory changes. Spleen: Normal in size without focal abnormality. Adrenals/Urinary Tract: Adrenal glands appear normal. No hydronephrosis or renal obstruction is noted. No renal or ureteral calculi are noted. Urinary bladder is decompressed secondary to Foley catheter. Stomach/Bowel: Stomach appears normal. There is no evidence of bowel obstruction or inflammation. Sigmoid diverticulosis is noted without inflammation. Lymphatic: No significant adenopathy is noted. Reproductive: Prostatic calcifications are noted. Other: No abdominal wall hernia or abnormality. No abdominopelvic ascites. Musculoskeletal: No acute or significant osseous findings. Review of the MIP images confirms the above findings. IMPRESSION: No evidence of thoracic or abdominal aortic dissection. 5.1 cm ascending thoracic aortic aneurysm. Recommend semi-annual imaging followup by CTA or MRA and referral to cardiothoracic surgery if not already obtained. This recommendation follows 2010 ACCF/AHA/AATS/ACR/ASA/SCA/SCAI/SIR/STS/SVM Guidelines for the Diagnosis and Management of Patients With  Thoracic Aortic Disease. Circulation. 2010; 121:: [id number] Aortic aneurysm NOS (ICD10-I71.9). Extensive coronary artery calcifications are noted suggesting coronary artery disease. Severe stenosis is noted involving the proximal portion of the left renal artery. Sigmoid diverticulosis  without inflammation. Aortic Atherosclerosis (ICD10-I70.0). Electronically Signed   By: Marijo Conception M.D.   On: 08/29/2022 19:03   DG Chest 1 View  Result Date: 08/29/2022 CLINICAL DATA:  Chest pain EXAM: CHEST  1 VIEW COMPARISON:  10/08/2021 FINDINGS: Left-sided pacing device as before. Cardiomegaly with vascular congestion. Mild diffuse interstitial opacity. No pleural effusion or pneumothorax. Aortic atherosclerosis. IMPRESSION: Cardiomegaly with central vascular congestion and mild diffuse interstitial opacity suggestive of mild edema Electronically Signed   By: Donavan Foil M.D.   On: 08/29/2022 18:25    EKG: My personal interpretation of EKG shows: paced rhythm    Assessment/Plan Principal Problem:   Physical deconditioning    Assessment and Plan: * Physical deconditioning After speaking at length with the patient's son and daughter-in-law, I think the patient is stable for discharge.  Troponins are flat.  No evidence of PE on CTPA.  He is already on Eliquis.  I think the patient's shortness of breath is likely due to a combination of recent weight loss due to pain and lack of eating over the last 2 weeks due to his choroidal hemorrhage in his right eye.  Also due to the lack of his regular physical activity due to continued pain.  Family states that they have been giving him pain medicine over the last 2 weeks due to his severe eye pain.  They have stopped giving him his pain medicine since his eye surgery.  No concern for opiate withdrawal.  In addition, patient recently started on some corticosteroids that was prescribed by his ophthalmologist.  This could also affect his sleep-wake cycle.  Stressed to  the family that they should try to fit in all his steroid doses during his waking hours and avoid steroid administration within 6 hours of his bedtime.  Interestingly, the son states that the patient is late wife used to tell him that he needed to suffer before he died.  Family states that today when he woke up, the patient had no complaints of any eye pain.  Son seems to think that patient interpreted his lack of pain to mean that he had already died.  It seems possible based on the history.  Overall I do not think that inpatient admission would benefit this patient.  Given his late age at 68, he would not be a candidate for any aggressive cardiac intervention anyway.  Discussed with the family that patient may benefit from physical therapy but they did not think that it would help him at his age of 54.  I agree with this.  Patient can also be referred to physical therapy by his PCP if needed.  I have sent a secure chat to the ED provider telling them that the patient is stable for discharge.   Family Communication: discussed with pt's son and dtr-in-law at bedside  Disposition Plan: return home  Consults called: none  Admission status:  pt does not required hospitalization ,  discussed via secure chat with EDP. Pt stable for DC to home.   Kristopher Oppenheim, DO [facility] 08/29/2022, 8:31 PM

## 2022-08-29 NOTE — ED Provider Notes (Signed)
[facility] EMERGENCY DEPARTMENT Provider Note  CSN: [id number] Arrival date & time: 08/29/22 1447  Chief Complaint(s) Shortness of Breath  HPI Douglas Walker is a 88 y.o. male with history of hypertension, hyperlipidemia, paroxysmal A-fib on Eliquis, prior stroke with chronic left-sided weakness presenting to the emergency department shortness of breath.  Patient reports that he developed shortness of breath this morning.  Reports some lower abdominal pain, no dysuria.  No fevers or chills.  No cough.  No leg swelling.  Nothing makes his symptoms better or worse.  Denies chest pain.  Patient did notably have a right eye enucleation recently for intractable right eye pain.   Past Medical History Past Medical History:  Diagnosis Date   Arthritis    shoulders and back   Ascending aorta dilatation (HCC)    Cancer (HCC)    skin cancers   Chronic diastolic (congestive) heart failure (HCC)    Chronic low back pain 12/15/2017   CKD (chronic kidney disease) stage 3, GFR 30-59 ml/min (HCC)    Dysrhythmia     Paroxysmal atrial fibrillation; Tachycardia-bradycardia syndrome   Essential tremor 02/28/2016   GERD (gastroesophageal reflux disease)    Hernia    History of hiatal hernia    Hyperlipemia    Hypertension    Macular degeneration    Neuromuscular disorder (HCC)    neuropathy   Paroxysmal atrial fibrillation ([place]) 10/28/2013   Presence of permanent cardiac pacemaker 12/04/2018   Pulmonary hypertension ([place])    Stroke ([place]) 12/2020   Tachycardia-bradycardia syndrome ([place]) 09/13/2014   TIA (transient ischemic attack)    Patient Active Problem List   Diagnosis Date Noted   Physical deconditioning 08/29/2022   Eye pain, right 08/10/2022   Blind painful right eye 08/08/2022   Vitreous hemorrhage of right eye ([place]) 08/05/2022   Choroidal hemorrhage, right 08/05/2022   Secondary glaucoma due to combination mechanisms, right, moderate stage 08/05/2022    History of corneal transplant 05/16/2022   Recurrent UTI 01/31/2022   Chronic indwelling Foley catheter 01/31/2022   Benign prostatic hyperplasia with lower urinary tract symptoms 01/31/2022   Medication monitoring encounter 01/31/2022   Multiple drug resistant organism (MDRO) culture positive 01/31/2022   Bacteremia due to Proteus species 10/11/2021   AKI (acute kidney injury) ([place]) 07/06/2021   UTI (urinary tract infection) 07/02/2021   Pain in both testicles    Right hemiparesis ([place]) 04/23/2021   Cerebrovascular accident (CVA) due to embolism of left middle cerebral artery ([place]) 04/23/2021   Neuropathy 04/23/2021   Abnormality of gait 04/23/2021   Abnormal results of thyroid function studies 04/04/2021   Acquired iron deficiency anemia due to decreased absorption 04/04/2021   Acquired thrombophilia ([place]) 04/04/2021   Cerebral atherosclerosis 04/04/2021   Basal cell carcinoma of nose 04/04/2021   Benign hypertensive heart and renal disease, with heart and renal failure ([place]) 04/04/2021   Benign prostatic hyperplasia 04/04/2021   Obesity 04/04/2021   Bradycardia 04/04/2021   Closed fracture of one rib 04/04/2021   Constipation by delayed colonic transit 04/04/2021   Fatigue 04/04/2021   Gastro-esophageal reflux disease without esophagitis 04/04/2021   Hypothyroidism 04/04/2021   Left lower quadrant pain 04/04/2021   Lumbar radiculopathy 04/04/2021   Reactive depression 04/04/2021   Malignant neoplasm of anterior wall of urinary bladder ([place]) 04/04/2021   Mass of chest wall 04/04/2021   Mixed hyperlipidemia 04/04/2021   Nocturia 04/04/2021   Other shoulder lesions, right shoulder 04/04/2021   Pain in limb 04/04/2021  Peripheral edema 04/04/2021   Personal history of malignant neoplasm of bladder 04/04/2021   Pruritus of genitalia 04/04/2021   Tremor 04/04/2021   Unilateral primary osteoarthritis, left knee 04/04/2021   Unspecified mononeuropathy of unspecified lower limb  04/04/2021   Urinary incontinence 04/04/2021   Dysphagia, post-stroke    Chronic diastolic congestive heart failure (HCC)    Chronic pain syndrome    Left middle cerebral artery stroke ([place]) 02/01/2021   Acute CVA (cerebrovascular accident) ([place]) 01/28/2021   Pacemaker 08/30/2020   Hematoma, chest wall 07/24/2020   Exudative age-related macular degeneration of right eye with inactive choroidal neovascularization ([place]) 05/10/2020   Exudative age-related macular degeneration of left eye with inactive choroidal neovascularization ([place]) 05/10/2020   Retinal hemorrhage of right eye 05/10/2020   Exposure keratopathy, bilateral 05/10/2020   Bilateral dry eyes 05/10/2020   Advanced nonexudative age-related macular degeneration of both eyes with subfoveal involvement 05/10/2020   Sick sinus syndrome ([place]) 12/06/2018   Complete heart block (HCC) 12/04/2018   Chronic low back pain 12/15/2017   Hereditary and idiopathic peripheral neuropathy 07/01/2016   Essential tremor 02/28/2016   BPPV (benign paroxysmal positional vertigo) 06/19/2015   Dizziness 06/18/2015   Bacteria in urine 06/18/2015   Stage 3a chronic kidney disease (CKD) ([place]) 06/18/2015   Chronic diastolic CHF (congestive heart failure) ([place]) 05/30/2015   Fall    Syncope 05/19/2015   Lower urinary tract infectious disease 05/19/2015   Hypotension 05/19/2015   Dehydration 05/19/2015   Fracture of rib of left side 05/19/2015   Syncope and collapse 05/19/2015   Mobitz type II atrioventricular block 09/13/2014   Essential hypertension 09/13/2014   Chronic anticoagulation 09/13/2014   Tachycardia-bradycardia syndrome ([place]) 09/13/2014   Paroxysmal atrial fibrillation ([place]) 10/28/2013   Thrombocytopenia ([place]) 04/06/2013   Home Medication(s) Prior to Admission medications   Medication Sig Start Date End Date Taking? Authorizing Provider  acetaminophen (TYLENOL) 325 MG tablet Take 2 tablets (650 mg total) by mouth every 4 (four) hours  as needed for mild pain (or temp > 37.5 C (99.5 F)). 02/14/21   Angiulli, Lavon Paganini, PA-C  apixaban (ELIQUIS) 5 MG TABS tablet Take 1 tablet (5 mg total) by mouth 2 (two) times daily. 08/19/22   Belva Crome, MD  Ascorbic Acid (VITAMIN C) 1000 MG tablet Take 1,000 mg by mouth daily.    [provider]  atropine 1 % ophthalmic solution Place 1 drop into the right eye 2 (two) times daily. 08/05/22   Rankin, Clent Demark, MD  brimonidine (ALPHAGAN) 0.2 % ophthalmic solution Place 1 drop into the right eye every 8 (eight) hours. 08/08/22 08/08/23  Rankin, Clent Demark, MD  dorzolamide-timolol (COSOPT) 22.3-6.8 MG/ML ophthalmic solution Place 1 drop into the right eye 2 (two) times daily. 08/13/22   Pokhrel, Corrie Mckusick, MD  DULoxetine (CYMBALTA) 60 MG capsule Take 1 capsule (60 mg total) by mouth daily. 04/23/21   Lovorn, Jinny Blossom, MD  furosemide (LASIX) 20 MG tablet Take 2 tablets (40 mg total) by mouth daily. Patient taking differently: Take 60 mg by mouth daily. 02/14/21   Angiulli, Lavon Paganini, PA-C  levothyroxine (SYNTHROID) 25 MCG tablet Take 1 tablet (25 mcg total) by mouth daily before breakfast. 02/14/21   Angiulli, Lavon Paganini, PA-C  loratadine (CLARITIN) 10 MG tablet Take 10 mg by mouth daily.    [provider]  magnesium hydroxide (MILK OF MAGNESIA) 400 MG/5ML suspension Take 30 mLs by mouth daily as needed for mild constipation.    [provider]  Multiple Vitamin (MULTIVITAMIN WITH MINERALS) TABS tablet Take 1 tablet by mouth daily.    [provider]  Multiple Vitamins-Minerals (OCUVITE ADULT 50+) CAPS Take 1 capsule by mouth daily.    [provider]  nitrofurantoin, macrocrystal-monohydrate, (MACROBID) 100 MG capsule Take 100 mg by mouth daily. Continuous course.    [provider]  polyethylene glycol (MIRALAX / GLYCOLAX) 17 g packet Take 17 g by mouth daily. Patient taking differently: Take 17 g by mouth daily as needed for mild constipation. 07/08/21   Mariel Aloe, MD  prednisoLONE acetate (PRED FORTE) 1 % ophthalmic suspension Place 1 drop into the right eye 2 (two) times daily. 08/05/22   Rankin, Clent Demark, MD  pregabalin (LYRICA) 50 MG capsule Take 50 mg by mouth at bedtime.    [provider]  primidone (MYSOLINE) 250 MG tablet Take 1 tablet (250 mg total) by mouth 2 (two) times daily. 02/14/21   Angiulli, Lavon Paganini, PA-C  topiramate (TOPAMAX) 50 MG tablet Take 1 tablet (50 mg total) by mouth 2 (two) times daily. 02/27/22   Ward Givens, NP  vitamin B-12 (CYANOCOBALAMIN) 1000 MCG tablet Take 1 tablet (1,000 mcg total) by mouth every other day. Patient taking differently: Take 1,000 mcg by mouth See admin instructions. 1075mg oral daily on Sunday, Tuesday, Wednesday, Thursday, repeat. 02/14/21   Angiulli, DLavon Paganini PA-C                                                                                                                                    Past Surgical History Past Surgical History:  Procedure Laterality Date   APPENDECTOMY     EYE SURGERY     HERNIA REPAIR     HERNIA REPAIR     INSERT / REPLACE / REMOVE PACEMAKER  12/04/2018   IRRIGATION AND DEBRIDEMENT ABSCESS Right 07/24/2020   Procedure: IRRIGATION AND DEBRIDEMENT HEMATOMA;  Surgeon: BCoralie Keens MD;  [facility]  Service: General;  Laterality: Right;   MASS EXCISION Right 07/20/2020   Procedure: EXCISION OF RIGHT CHEST WALL MASS;  Surgeon: BStark Klein MD;  [facility]  Service: General;  Laterality: Right;   PACEMAKER IMPLANT N/A 12/04/2018   Procedure: PACEMAKER IMPLANT;  Surgeon: TEvans Lance MD;  [facility];  Service: Cardiovascular;  Laterality: N/A;   Family History Family History  Problem Relation Age of Onset   GI problems Mother    Other Sister        PAIN ISSUES   Hearing loss Sister    Blindness Sister    Heart attack Neg Hx    Stroke Neg Hx     Social History Social History   Tobacco Use   Smoking status: Former     Types: Cigarettes    Quit date: 12/30/1962    Years since quitting: 59.7   Smokeless tobacco: Never  Vaping Use   Vaping  Use: Never used  Substance Use Topics   Alcohol use: No   Drug use: No   Allergies Sulfa antibiotics, Mysoline [primidone], and Zocor [simvastatin]  Review of Systems Review of Systems  All other systems reviewed and are negative.   Physical Exam Vital Signs  I have reviewed the triage vital signs BP (!) 147/99 (BP Location: Right Arm)   Pulse 70   Temp 98.3 F (36.8 C) (Oral)   Resp 20   SpO2 92%  Physical Exam Vitals and nursing note reviewed.  Constitutional:      General: He is not in acute distress.    Appearance: Normal appearance.  HENT:     Mouth/Throat:     Mouth: Mucous membranes are moist.  Eyes:     Conjunctiva/sclera: Conjunctivae normal.  Cardiovascular:     Rate and Rhythm: Normal rate and regular rhythm.  Pulmonary:     Effort: Pulmonary effort is normal. No respiratory distress.     Breath sounds: Normal breath sounds.  Abdominal:     General: Abdomen is flat.     Palpations: Abdomen is soft.     Tenderness: There is no abdominal tenderness.  Musculoskeletal:     Right lower leg: No edema.     Left lower leg: No edema.  Skin:    General: Skin is warm and dry.     Capillary Refill: Capillary refill takes less than 2 seconds.  Neurological:     Mental Status: He is alert and oriented to person, place, and time. Mental status is at baseline.  Psychiatric:        Mood and Affect: Mood normal.        Behavior: Behavior normal.     ED Results and Treatments Labs (all labs ordered are listed, but only abnormal results are displayed) Labs Reviewed  BRAIN NATRIURETIC PEPTIDE - Abnormal; Notable for the following components:      Result Value   B Natriuretic Peptide 357.1 (*)    All other components within normal limits  BASIC METABOLIC PANEL - Abnormal; Notable for the following components:   BUN 25 (*)    Creatinine, Ser  1.45 (*)    GFR, Estimated 44 (*)    All other components within normal limits  CBC WITH DIFFERENTIAL/PLATELET - Abnormal; Notable for the following components:   RBC 3.29 (*)    Hemoglobin 10.7 (*)    HCT 31.4 (*)    Platelets 104 (*)    All other components within normal limits  D-DIMER, QUANTITATIVE - Abnormal; Notable for the following components:   D-Dimer, Quant 0.87 (*)    All other components within normal limits  URINALYSIS, ROUTINE W REFLEX MICROSCOPIC - Abnormal; Notable for the following components:   Color, Urine STRAW (*)    Nitrite POSITIVE (*)    Leukocytes,Ua LARGE (*)    Bacteria, UA RARE (*)    All other components within normal limits  I-STAT VENOUS BLOOD GAS, ED - Abnormal; Notable for the following components:   pH, Ven 7.449 (*)    pCO2, Ven 42.5 (*)    Bicarbonate 29.5 (*)    Acid-Base Excess 5.0 (*)    HCT 32.0 (*)    Hemoglobin 10.9 (*)    All other components within normal limits  TROPONIN I (HIGH SENSITIVITY) - Abnormal; Notable for the following components:   Troponin I (High Sensitivity) 30 (*)    All other components within normal limits  TROPONIN I (HIGH SENSITIVITY) - Abnormal; Notable  for the following components:   Troponin I (High Sensitivity) 30 (*)    All other components within normal limits  PROTIME-INR                                                                                                                          Radiology CT Angio Chest/Abd/Pel for Dissection W and/or W/WO  Result Date: 08/29/2022 CLINICAL DATA:  Acute aortic syndrome. EXAM: CT ANGIOGRAPHY CHEST, ABDOMEN AND PELVIS TECHNIQUE: Non-contrast CT of the chest was initially obtained. Multidetector CT imaging through the chest, abdomen and pelvis was performed using the standard protocol during bolus administration of intravenous contrast. Multiplanar reconstructed images and MIPs were obtained and reviewed to evaluate the vascular anatomy. RADIATION DOSE REDUCTION:  This exam was performed according to the departmental dose-optimization program which includes automated exposure control, adjustment of the mA and/or kV according to patient size and/or use of iterative reconstruction technique. CONTRAST:  37m OMNIPAQUE IOHEXOL 350 MG/ML SOLN COMPARISON:  April 26, 2022.  July 02, 2021. FINDINGS: CTA CHEST FINDINGS Cardiovascular: 5.1 cm ascending thoracic aortic aneurysm is noted. Great vessels are widely patent. No dissection is noted. Normal cardiac size. No pericardial effusion. Extensive coronary artery calcifications are noted. Mediastinum/Nodes: No enlarged mediastinal, hilar, or axillary lymph nodes. Thyroid gland, trachea, and esophagus demonstrate no significant findings. Lungs/Pleura: Lungs are clear. No pleural effusion or pneumothorax. Musculoskeletal: No chest wall abnormality. No acute or significant osseous findings. Review of the MIP images confirms the above findings. CTA ABDOMEN AND PELVIS FINDINGS VASCULAR Aorta: Atherosclerosis of abdominal aorta is noted without aneurysm or dissection. Celiac: Patent without evidence of aneurysm, dissection, vasculitis or significant stenosis. SMA: Patent without evidence of aneurysm, dissection, vasculitis or significant stenosis. Renals: Severe stenosis is seen involving the proximal portion of the left renal artery. Right renal artery is widely patent. IMA: Patent without evidence of aneurysm, dissection, vasculitis or significant stenosis. Inflow: Patent without evidence of aneurysm, dissection, vasculitis or significant stenosis. Veins: No obvious venous abnormality within the limitations of this arterial phase study. Review of the MIP images confirms the above findings. NON-VASCULAR Hepatobiliary: No gallstones or biliary dilatation is noted. Stable probable hepatic cysts. Pancreas: Unremarkable. No pancreatic ductal dilatation or surrounding inflammatory changes. Spleen: Normal in size without focal abnormality.  Adrenals/Urinary Tract: Adrenal glands appear normal. No hydronephrosis or renal obstruction is noted. No renal or ureteral calculi are noted. Urinary bladder is decompressed secondary to Foley catheter. Stomach/Bowel: Stomach appears normal. There is no evidence of bowel obstruction or inflammation. Sigmoid diverticulosis is noted without inflammation. Lymphatic: No significant adenopathy is noted. Reproductive: Prostatic calcifications are noted. Other: No abdominal wall hernia or abnormality. No abdominopelvic ascites. Musculoskeletal: No acute or significant osseous findings. Review of the MIP images confirms the above findings. IMPRESSION: No evidence of thoracic or abdominal aortic dissection. 5.1 cm ascending thoracic aortic aneurysm. Recommend semi-annual imaging followup by CTA or MRA and referral to cardiothoracic surgery if not already obtained. This recommendation follows  2010 ACCF/AHA/AATS/ACR/ASA/SCA/SCAI/SIR/STS/SVM Guidelines for the Diagnosis and Management of Patients With Thoracic Aortic Disease. Circulation. 2010; 121: Q947-M546. Aortic aneurysm NOS (ICD10-I71.9). Extensive coronary artery calcifications are noted suggesting coronary artery disease. Severe stenosis is noted involving the proximal portion of the left renal artery. Sigmoid diverticulosis without inflammation. Aortic Atherosclerosis (ICD10-I70.0). Electronically Signed   By: Marijo Conception M.D.   On: 08/29/2022 19:03   DG Chest 1 View  Result Date: 08/29/2022 CLINICAL DATA:  Chest pain EXAM: CHEST  1 VIEW COMPARISON:  10/08/2021 FINDINGS: Left-sided pacing device as before. Cardiomegaly with vascular congestion. Mild diffuse interstitial opacity. No pleural effusion or pneumothorax. Aortic atherosclerosis. IMPRESSION: Cardiomegaly with central vascular congestion and mild diffuse interstitial opacity suggestive of mild edema Electronically Signed   By: Donavan Foil M.D.   On: 08/29/2022 18:25    Pertinent labs & imaging  results that were available during my care of the patient were reviewed by me and considered in my medical decision making (see MDM for details).  Medications Ordered in ED Medications  iohexol (OMNIPAQUE) 350 MG/ML injection 80 mL (80 mLs Intravenous Contrast Given 08/29/22 1822)                                                                                                                                     Procedures Procedures  (including critical care time)  Medical Decision Making / ED Course   MDM:  88 year old male presenting to the emergency department with shortness of breath.  Patient overall well-appearing, vitals reassuring including normal oxygen, no tachypnea, no tachycardia.  Afebrile.  Lungs clear on exam.  No wheezing or focal sounds.  Unclear cause of shortness of breath.  Differential includes pneumonia, pneumothorax, pulmonary embolism, ACS, less likely CHF without signs of volume overload on exam, doubt COPD with no wheezing.  Will obtain work-up including chest x-ray, D-dimer, troponin, EKG.  Will obtain urinalysis given lower abdominal discomfort and history of urine infections, but no tenderness on exam and no nausea, vomiting, diarrhea to suggest intra-abdominal process.  Will monitor closely.  Clinical Course as of 08/30/22 0141  Thu Aug 29, 2022  2047 Discussed results with hospitalist.  Patient has elevated troponin although it is flat and his EKG appears similar to prior.  His D-dimer was elevated, no sign of dissection, patient is already on Eliquis and no pulmonary embolism was noted although the study is not properly timed to fully evaluate for pulmonary embolism.  He had no sign of pulmonary edema on CT scan and no sign of pneumonia or pneumothorax.  Given his overall reassuring work-up, along with his age, it is unlikely that inpatient cardiac work-up would lead to intervention.  Advise close follow-up with primary care physician.  The hospitalist felt that  symptoms might be explained by deconditioning on further discussion with the family. Will discharge patient to home. All questions answered. Family and patient comfortable with plan of discharge.  Return precautions discussed with patient and specified on the after visit summary.  [WS]    Clinical Course User Index [WS] Cristie Hem, MD     Additional history obtained: -Additional history obtained from ems -External records from outside source obtained and reviewed including: Chart review including previous notes, labs, imaging, consultation notes   Lab Tests: -I ordered, reviewed, and interpreted labs.   The pertinent results include:   Labs Reviewed  BRAIN NATRIURETIC PEPTIDE - Abnormal; Notable for the following components:      Result Value   B Natriuretic Peptide 357.1 (*)    All other components within normal limits  BASIC METABOLIC PANEL - Abnormal; Notable for the following components:   BUN 25 (*)    Creatinine, Ser 1.45 (*)    GFR, Estimated 44 (*)    All other components within normal limits  CBC WITH DIFFERENTIAL/PLATELET - Abnormal; Notable for the following components:   RBC 3.29 (*)    Hemoglobin 10.7 (*)    HCT 31.4 (*)    Platelets 104 (*)    All other components within normal limits  D-DIMER, QUANTITATIVE - Abnormal; Notable for the following components:   D-Dimer, Quant 0.87 (*)    All other components within normal limits  URINALYSIS, ROUTINE W REFLEX MICROSCOPIC - Abnormal; Notable for the following components:   Color, Urine STRAW (*)    Nitrite POSITIVE (*)    Leukocytes,Ua LARGE (*)    Bacteria, UA RARE (*)    All other components within normal limits  I-STAT VENOUS BLOOD GAS, ED - Abnormal; Notable for the following components:   pH, Ven 7.449 (*)    pCO2, Ven 42.5 (*)    Bicarbonate 29.5 (*)    Acid-Base Excess 5.0 (*)    HCT 32.0 (*)    Hemoglobin 10.9 (*)    All other components within normal limits  TROPONIN I (HIGH SENSITIVITY) -  Abnormal; Notable for the following components:   Troponin I (High Sensitivity) 30 (*)    All other components within normal limits  TROPONIN I (HIGH SENSITIVITY) - Abnormal; Notable for the following components:   Troponin I (High Sensitivity) 30 (*)    All other components within normal limits  PROTIME-INR      EKG   EKG Interpretation  Date/Time:  Thursday August 29 2022 15:35:00 EDT Ventricular Rate:  70 PR Interval:    QRS Duration: 122 QT Interval:  418 QTC Calculation: 451 R Axis:   18 Text Interpretation: Ventricular-paced rhythm Abnormal ECG When compared with ECG of 08-Oct-2021 22:01, No significant change since last tracing Confirmed by Garnette Gunner [phone]) on 08/29/2022 3:44:02 PM         Imaging Studies ordered: I ordered imaging studies including CXR, CTA chest/abdomen On my interpretation imaging demonstrates no acute process I independently visualized and interpreted imaging. I agree with the radiologist interpretation   Medicines ordered and prescription drug management: Meds ordered this encounter  Medications   iohexol (OMNIPAQUE) 350 MG/ML injection 80 mL    -I have reviewed the patients home medicines and have made adjustments as needed   Consultations Obtained: I requested consultation with the hospitalist,  and discussed lab and imaging findings as well as pertinent plan - they recommend: discharge    Social Determinants of Health:  Factors impacting patients care include: former smoker   Reevaluation: After the interventions noted above, I reevaluated the patient and found that they have improved  Co morbidities that complicate the patient  evaluation  Past Medical History:  Diagnosis Date   Arthritis    shoulders and back   Ascending aorta dilatation (HCC)    Cancer (HCC)    skin cancers   Chronic diastolic (congestive) heart failure (HCC)    Chronic low back pain 12/15/2017   CKD (chronic kidney disease) stage 3, GFR 30-59  ml/min (HCC)    Dysrhythmia     Paroxysmal atrial fibrillation; Tachycardia-bradycardia syndrome   Essential tremor 02/28/2016   GERD (gastroesophageal reflux disease)    Hernia    History of hiatal hernia    Hyperlipemia    Hypertension    Macular degeneration    Neuromuscular disorder (HCC)    neuropathy   Paroxysmal atrial fibrillation ([place]) 10/28/2013   Presence of permanent cardiac pacemaker 12/04/2018   Pulmonary hypertension ([place])    Stroke ([place]) 12/2020   Tachycardia-bradycardia syndrome ([place]) 09/13/2014   TIA (transient ischemic attack)       Dispostion: Discharge    Final Clinical Impression(s) / ED Diagnoses Final diagnoses:  SOB (shortness of breath)     This chart was dictated using voice recognition software.  Despite best efforts to proofread,  errors can occur which can change the documentation meaning.    Cristie Hem, MD 08/30/22 [phone]

## 2022-09-03 ENCOUNTER — Encounter (INDEPENDENT_AMBULATORY_CARE_PROVIDER_SITE_OTHER): Payer: Medicare Other | Admitting: Ophthalmology

## 2022-09-05 ENCOUNTER — Encounter (INDEPENDENT_AMBULATORY_CARE_PROVIDER_SITE_OTHER): Payer: Self-pay

## 2022-09-12 DIAGNOSIS — N400 Enlarged prostate without lower urinary tract symptoms: Secondary | ICD-10-CM | POA: Diagnosis not present

## 2022-09-12 DIAGNOSIS — I5042 Chronic combined systolic (congestive) and diastolic (congestive) heart failure: Secondary | ICD-10-CM | POA: Diagnosis not present

## 2022-09-12 DIAGNOSIS — Z8551 Personal history of malignant neoplasm of bladder: Secondary | ICD-10-CM | POA: Diagnosis not present

## 2022-09-12 DIAGNOSIS — E039 Hypothyroidism, unspecified: Secondary | ICD-10-CM | POA: Diagnosis not present

## 2022-09-12 DIAGNOSIS — F322 Major depressive disorder, single episode, severe without psychotic features: Secondary | ICD-10-CM | POA: Diagnosis not present

## 2022-09-12 DIAGNOSIS — K219 Gastro-esophageal reflux disease without esophagitis: Secondary | ICD-10-CM | POA: Diagnosis not present

## 2022-09-12 DIAGNOSIS — I10 Essential (primary) hypertension: Secondary | ICD-10-CM | POA: Diagnosis not present

## 2022-09-12 DIAGNOSIS — M25569 Pain in unspecified knee: Secondary | ICD-10-CM | POA: Diagnosis not present

## 2022-09-12 DIAGNOSIS — E782 Mixed hyperlipidemia: Secondary | ICD-10-CM | POA: Diagnosis not present

## 2022-09-12 DIAGNOSIS — H5711 Ocular pain, right eye: Secondary | ICD-10-CM | POA: Diagnosis not present

## 2022-09-12 DIAGNOSIS — I480 Paroxysmal atrial fibrillation: Secondary | ICD-10-CM | POA: Diagnosis not present

## 2022-09-12 DIAGNOSIS — Z9289 Personal history of other medical treatment: Secondary | ICD-10-CM | POA: Diagnosis not present

## 2022-09-12 DIAGNOSIS — D6869 Other thrombophilia: Secondary | ICD-10-CM | POA: Diagnosis not present

## 2022-09-18 ENCOUNTER — Ambulatory Visit (INDEPENDENT_AMBULATORY_CARE_PROVIDER_SITE_OTHER): Payer: Medicare Other | Admitting: Podiatry

## 2022-09-18 ENCOUNTER — Encounter: Payer: Self-pay | Admitting: Podiatry

## 2022-09-18 DIAGNOSIS — E1142 Type 2 diabetes mellitus with diabetic polyneuropathy: Secondary | ICD-10-CM

## 2022-09-18 DIAGNOSIS — B351 Tinea unguium: Secondary | ICD-10-CM

## 2022-09-18 DIAGNOSIS — D696 Thrombocytopenia, unspecified: Secondary | ICD-10-CM | POA: Diagnosis not present

## 2022-09-18 DIAGNOSIS — M79676 Pain in unspecified toe(s): Secondary | ICD-10-CM | POA: Diagnosis not present

## 2022-09-18 DIAGNOSIS — L608 Other nail disorders: Secondary | ICD-10-CM

## 2022-09-18 NOTE — Progress Notes (Signed)
This patient returns to my office for at risk foot care.  This patient requires this care by a professional since this patient will be at risk due to having peripheral neuropathy, CKD, Coagulation defect and thrombocytopenia.  This patient is unable to cut nails himself since the patient cannot reach his nails.These nails are painful walking and wearing shoes.  This patient presents for at risk foot care today.  He presents to the office with male son.  He is taking eliquis.  General Appearance  Alert, conversant and in no acute stress.  Vascular  Dorsalis pedis and posterior tibial  pulses are not  palpable  bilaterally.  Capillary return is within normal limits  bilaterally. Temperature is within normal limits  Bilaterally. Swelling feet/legs  Neurologic  Senn-Weinstein monofilament wire test within normal limits  bilaterally. Muscle power within normal limits bilaterally.  Nails Thick disfigured discolored nails with subungual debris  from hallux to fifth toes bilaterally. Pincer nails hallux  B/L. No evidence of bacterial infection or drainage bilaterally.  Orthopedic  No limitations of motion  feet .  No crepitus or effusions noted.  No bony pathology or digital deformities noted.  HAV  B/L.  Skin  normotropic skin with no porokeratosis noted bilaterally.  No signs of infections or ulcers noted.     Onychomycosis  Pain in right toes  Pain in left toes  Consent was obtained for treatment procedures.   Mechanical debridement of nails 1-5  bilaterally performed with a nail nipper.  Filed with dremel without incident. Cauterized second toe left foot at iatrogenic lesion.   Return office visit  10 weeks                   Told patient to return for periodic foot care and evaluation due to potential at risk complications.   Lucy Woolever DPM  

## 2022-09-23 ENCOUNTER — Ambulatory Visit: Payer: Medicare Other | Attending: Physician Assistant | Admitting: Physician Assistant

## 2022-09-23 ENCOUNTER — Encounter (INDEPENDENT_AMBULATORY_CARE_PROVIDER_SITE_OTHER): Payer: Self-pay | Admitting: Ophthalmology

## 2022-09-23 ENCOUNTER — Encounter: Payer: Self-pay | Admitting: Physician Assistant

## 2022-09-23 VITALS — BP 104/64 | HR 70 | Ht 69.0 in | Wt 183.0 lb

## 2022-09-23 DIAGNOSIS — I77810 Thoracic aortic ectasia: Secondary | ICD-10-CM | POA: Diagnosis not present

## 2022-09-23 DIAGNOSIS — I700 Atherosclerosis of aorta: Secondary | ICD-10-CM | POA: Insufficient documentation

## 2022-09-23 DIAGNOSIS — I959 Hypotension, unspecified: Secondary | ICD-10-CM | POA: Insufficient documentation

## 2022-09-23 DIAGNOSIS — I272 Pulmonary hypertension, unspecified: Secondary | ICD-10-CM | POA: Insufficient documentation

## 2022-09-23 DIAGNOSIS — I495 Sick sinus syndrome: Secondary | ICD-10-CM | POA: Insufficient documentation

## 2022-09-23 DIAGNOSIS — I351 Nonrheumatic aortic (valve) insufficiency: Secondary | ICD-10-CM | POA: Insufficient documentation

## 2022-09-23 DIAGNOSIS — I5032 Chronic diastolic (congestive) heart failure: Secondary | ICD-10-CM | POA: Insufficient documentation

## 2022-09-23 DIAGNOSIS — I4819 Other persistent atrial fibrillation: Secondary | ICD-10-CM | POA: Insufficient documentation

## 2022-09-23 DIAGNOSIS — I2510 Atherosclerotic heart disease of native coronary artery without angina pectoris: Secondary | ICD-10-CM | POA: Insufficient documentation

## 2022-09-23 MED ORDER — FUROSEMIDE 20 MG PO TABS: 40.0000 mg | ORAL_TABLET | Freq: Every day | ORAL | 3 refills | Status: DC

## 2022-09-23 NOTE — Patient Instructions (Signed)
Medication Instructions:  DECREASE LASIX TO 2 TABS DAILY AND YOU MAY TAKE AN ADDITIONAL TABLET DAILY AS NEEDED FOR SWELLING IF YOUR TOP NUMBER IS GREATER THAN 105 *If you need a refill on your cardiac medications before your next appointment, please call your pharmacy*   Lab Work: TODAY-BMET, CBC, TSH If you have labs (blood work) drawn today and your tests are completely normal, you will receive your results only by: [place] (if you have MyChart) OR A paper copy in the mail If you have any lab test that is abnormal or we need to change your treatment, we will call you to review the results.   Testing/Procedures: NONE ORDERED   Follow-Up: At [facility], you and your health needs are our priority.  As part of our continuing mission to provide you with exceptional heart care, we have created designated Provider Care Teams.  These Care Teams include your primary Cardiologist (physician) and Advanced Practice Providers (APPs -  Physician Assistants and Nurse Practitioners) who all work together to provide you with the care you need, when you need it.  We recommend signing up for the patient portal called "MyChart".  Sign up information is provided on this After Visit Summary.  MyChart is used to connect with patients for Virtual Visits (Telemedicine).  Patients are able to view lab/test results, encounter notes, upcoming appointments, etc.  Non-urgent messages can be sent to your provider as well.   To learn more about what you can do with MyChart, go to [email or web address].    Your next appointment:   3-4 month(s)  The format for your next appointment:   In Person  Provider:   Sinclair Grooms, MD  or Melina Copa, PA-C       Other Instructions   Important Information About Sugar

## 2022-09-23 NOTE — Progress Notes (Addendum)
Cardiology Office Note    Date:  09/23/2022   ID:  Douglas Walker, DOB 25-Sep-1925, MRN [id number]  PCP:  Douglas Huddle, MD  Cardiologist:  Douglas Grooms, MD  Electrophysiologist:  Douglas Peru, MD   Chief Complaint: f/u CHF amongst multiple other cardiac issues  History of Present Illness:   Douglas Walker is a 88 y.o. male with history of CKD 3b, anemia/thrombocytopenia by labs, atrial fibrillation, chronic diastolic CHF, pulmonary HTN, sinus node dysfunction s/p PPM, 5.1cm thoracic aortic aneurysm by CT 07/2022, mild AI, arthritis, chronic back pain, essential tremor, ?CAD, GERD, hiatal hernia, HLD, HTN, macular degeneration, stroke, peripheral neuropathy who presents for follow-up.   He has known history of atrial fibrillation and recurrent syncope. He was ultimately found to have tachy-brady syndrome. In 2019 he underwent [employer] PPM implantation which has been followed by Dr. Lovena Walker. CAD is listed under PMH but no further details available. No cath or nuc on file. Last echo 12/2020 EF 01-09%, diastolic function could not be evaluated, mildly reduced RV function, moderately elevated PASP, mod BAE, mild AI, moderate dilation of ascending aorta. He is on warfarin because of prior drug interaction between Eliquis and primidone. He last saw Dr. Lovena Walker 05/2021 and was felt reasonably stable. He was admitted 02/2354 with complicated UTI, 73/2202 with severe sepsis/bacteremia in setting of CAUTI/pyelonephritis with chronic foley catheter (blood cx + Proteus), and 07/2022 with severe right eye pain found to have choroidal hemorrhage (vision not salvagable) and hypertertensive urgency in setting of pain. He went on to have his eye eviscerated with resolution of pain. He was seen in the ED again 08/29/22 with SOB.  Labs showed troponin 30->30, BNP 357, Hgb 10.7 (similar to prior), d-dimer 0.87. Although CXR raised question of fluid, CTA (dissection study) showed clear lungs, 5.1cm ascending TAA,  extensive coronary calcifications, left RAS, aortic atherosclerosis. Per EDP, no PE seen though not timed for ful evaluation. EKG showed atrial fib with V pacing. Given his reassuring workup and age, he was not felt to require admission. In retrospect his son believes this was related to anxiety - the patient had told them that morning he thought he was dead (son thinks this is because he no longer had the eye pain).  He is seen for follow-up today. As in prior visit, his son contributes most of the history and interaction. Son reports that over the past 3 weeks things have been uneventful. No SOB, CP, palpitations, dizziness, syncope. He lost about 20lb over the course of the eye situation but appetite is now better. They are following with PCP closely. His BP was low on initial check by CMA (90/44) with recheck by me 102/60 on right arm and 104/64 on left arm. The recheck values are in line with what his son has been seeing at home. They clarify he has been on three '20mg'$  Lasix tablets daily for a while.   Labwork independently reviewed: 07/2022 trop peak 30, d-dimer 0.87, Hgb 10.7, plt 104, K 4.3, Cr 1.45 09/2021 LFTs ok 2021 TSH wnl 12/2020 LDL 126  Cardiology Studies:   Studies reviewed are outlined and summarized above. Reports included below if pertinent.   2D Echo 12/2020  1. Left ventricular ejection fraction, by estimation, is 50 to 55%. The  left ventricle has low normal function. The left ventricle has no regional  wall motion abnormalities. Left ventricular diastolic function could not  be evaluated.   2. Right ventricular systolic function is mildly reduced. The  right  ventricular size is normal. There is moderately elevated pulmonary artery  systolic pressure.   3. Left atrial size was moderately dilated.   4. Right atrial size was moderately dilated.   5. The mitral valve is normal in structure. No evidence of mitral valve  regurgitation.   6. The aortic valve is tricuspid.  Aortic valve regurgitation is mild.  Mild aortic valve sclerosis is present, with no evidence of aortic valve  stenosis.   7. Aortic dilatation noted. There is moderate dilatation of the ascending  aorta, measuring 47 mm.   8. The inferior vena cava is dilated in size with <50% respiratory  variability, suggesting right atrial pressure of 15 mmHg.   Comparison(s): Prior images unable to be directly viewed, comparison made  by report only. Ascending aorta dilation has worsened since 2019 (but  measurement is similar to CT angio report from may 2021).     Past Medical History:  Diagnosis Date   Anemia    Arthritis    shoulders and back   Cancer (HCC)    skin cancers   Chronic diastolic (congestive) heart failure (HCC)    Chronic low back pain 12/15/2017   CKD (chronic kidney disease) stage 3, GFR 30-59 ml/min (HCC)    Essential tremor 02/28/2016   GERD (gastroesophageal reflux disease)    Hernia    History of hiatal hernia    Hyperlipemia    Hypertension    Macular degeneration    Neuromuscular disorder (HCC)    neuropathy   Paroxysmal atrial fibrillation ([place]) 10/28/2013   Presence of permanent cardiac pacemaker 12/04/2018   Pulmonary hypertension ([place])    Stroke ([place]) 12/2020   Tachycardia-bradycardia syndrome ([place]) 09/13/2014   Thoracic aortic aneurysm (HCC)    Thrombocytopenia (HCC)    TIA (transient ischemic attack)     Past Surgical History:  Procedure Laterality Date   [place] / REPLACE / REMOVE PACEMAKER  12/04/2018   IRRIGATION AND DEBRIDEMENT ABSCESS Right 07/24/2020   Procedure: IRRIGATION AND DEBRIDEMENT HEMATOMA;  Surgeon: Coralie Keens, MD;  Location: [place];  Service: General;  Laterality: Right;   MASS EXCISION Right 07/20/2020   Procedure: EXCISION OF RIGHT CHEST WALL MASS;  Surgeon: Stark Klein, MD;  Location: [place];  Service: General;  Laterality: Right;   PACEMAKER IMPLANT N/A  12/04/2018   Procedure: PACEMAKER IMPLANT;  Surgeon: Evans Lance, MD;  Location: [place] [facility];  Service: Cardiovascular;  Laterality: N/A;    Current Medications: Current Meds  Medication Sig   acetaminophen (TYLENOL) 325 MG tablet Take 2 tablets (650 mg total) by mouth every 4 (four) hours as needed for mild pain (or temp > 37.5 C (99.5 F)).   apixaban (ELIQUIS) 5 MG TABS tablet Take 1 tablet (5 mg total) by mouth 2 (two) times daily.   Ascorbic Acid (VITAMIN C) 1000 MG tablet Take 1,000 mg by mouth daily.   DULoxetine (CYMBALTA) 60 MG capsule Take 1 capsule (60 mg total) by mouth daily.   erythromycin ophthalmic ointment SMARTSIG:In Eye(s)   furosemide (LASIX) 20 MG tablet Take 2 tablets (40 mg total) by mouth daily. (Patient taking differently: Take 60 mg by mouth daily.)   levothyroxine (SYNTHROID) 25 MCG tablet Take 1 tablet (25 mcg total) by mouth daily before breakfast.   loratadine (CLARITIN) 10 MG tablet Take 10 mg by mouth daily.  magnesium hydroxide (MILK OF MAGNESIA) 400 MG/5ML suspension Take 30 mLs by mouth daily as needed for mild constipation.   Multiple Vitamin (MULTIVITAMIN WITH MINERALS) TABS tablet Take 1 tablet by mouth daily.   Multiple Vitamins-Minerals (OCUVITE ADULT 50+) CAPS Take 1 capsule by mouth daily.   nitrofurantoin, macrocrystal-monohydrate, (MACROBID) 100 MG capsule Take 100 mg by mouth daily. Continuous course.   polyethylene glycol (MIRALAX / GLYCOLAX) 17 g packet Take 17 g by mouth daily. (Patient taking differently: Take 17 g by mouth daily as needed for mild constipation.)   predniSONE (DELTASONE) 5 MG tablet Take by mouth.   pregabalin (LYRICA) 50 MG capsule Take 50 mg by mouth at bedtime.   primidone (MYSOLINE) 250 MG tablet Take 1 tablet (250 mg total) by mouth 2 (two) times daily.   tobramycin-dexamethasone (TOBRADEX) ophthalmic solution Place into the right eye.   topiramate (TOPAMAX) 50 MG tablet Take 1 tablet (50 mg total) by mouth 2  (two) times daily.   vitamin B-12 (CYANOCOBALAMIN) 1000 MCG tablet Take 1 tablet (1,000 mcg total) by mouth every other day. (Patient taking differently: Take 1,000 mcg by mouth See admin instructions. 109mg oral daily on Sunday, Tuesday, Wednesday, Thursday, repeat.)    Allergies:   Sulfa antibiotics, Mysoline [primidone], and Zocor [simvastatin]   Social History   Socioeconomic History   Marital status: Widowed    Spouse name: Not on file   Number of children: 1   Years of education: 10   Highest education level: Not on file  Occupational History    Comment: retired  Tobacco Use   Smoking status: Former    Types: Cigarettes    Quit date: 12/30/1962    Years since quitting: 59.7   Smokeless tobacco: Never  Vaping Use   Vaping Use: Never used  Substance and Sexual Activity   Alcohol use: No   Drug use: No   Sexual activity: Not Currently  Other Topics Concern   Not on file  Social History Narrative   Lives alone   Right-handed   Caffeine use- caffeine free coffee, occas soda   Social Determinants of Health   Financial Resource Strain: Not on file  Food Insecurity: Not on file  Transportation Needs: Not on file  Physical Activity: Not on file  Stress: Not on file  Social Connections: Not on file     Family History:  The patient's family history includes Blindness in his sister; GI problems in his mother; Hearing loss in his sister; Other in his sister. There is no history of Heart attack or Stroke.  ROS:   Please see the history of present illness. All other systems are reviewed and otherwise negative.    EKG(s)/Additional Labs   EKG:  EKG is not ordered today but reviewed from recent tracing above  Recent Labs: 10/09/2021: ALT 58; Magnesium 1.9 08/29/2022: B Natriuretic Peptide 357.1; BUN 25; Creatinine, Ser 1.45; Hemoglobin 10.9; Platelets 104; Potassium 4.2; Sodium 137  Recent Lipid Panel    Component Value Date/Time   CHOL 188 01/29/2021 0237   TRIG  96 01/29/2021 0237   HDL 43 01/29/2021 0237   CHOLHDL 4.4 01/29/2021 0237   VLDL 19 01/29/2021 0237   LDLCALC 126 (H) 01/29/2021 0237    PHYSICAL EXAM:    VS:  BP 104/64 (BP Location: Left Arm)   Pulse 70   Ht '5\' 9"'$  (1.753 m)   Wt 183 lb (83 kg)   SpO2 97%   BMI 27.02 kg/m   BMI:  Body mass index is 27.02 kg/m.  GEN: Frail appearing WM male in no acute distress HEENT: normocephalic, atraumatic, s/p R eye evisceration Neck: no JVD, carotid bruits, or masses Cardiac: RRR; no murmurs, rubs, or gallops, no edema  Respiratory:  clear to auscultation bilaterally, normal work of breathing GI: soft, nontender, nondistended, + BS MS: no deformity or atrophy Skin: warm and dry, diffuse senile purpura Neuro:  Alert and Oriented to self, place, does not really contribute much to conversation, follows commands Psych: flat affect  Wt Readings from Last 3 Encounters:  09/23/22 183 lb (83 kg)  08/10/22 197 lb 8.5 oz (89.6 kg)  08/02/22 197 lb (89.4 kg)     ASSESSMENT & PLAN:   1. Hypotension - BP running slightly soft today which is similar at home. He was having issues with HTN before in the setting of eye pain which has resolved. Aside from furosemide, he is not on any other antihypertensives. His eye doctor has him taking a low dose of prednisone. He is asymptomatic today. His son has wondered if he's been on the dehydrated side with his weight loss as well. BNP was up in recent ER visit with possible fluid on CXR but f/u CT showed clear lungs. Will decrease Lasix to 2 tablets ('40mg'$  total) daily with permission to take an extra 1 tablet daily PRN swelling as long as SBP>105. Given advanced age and comorbidities, will follow conservatively, with instructions to call if he begins to develop any problems or symptoms. Follow up CBC/BMET/TSH today. Also has close fu with PCP.  2. Persistent atrial fibrillation, tachy brady syndrome s/p PPM - recent EKG suggestive of underlying Afib. At most prior  clinic visits he has been in atrial fib, though in NSR per Dr. Tanna Furry 08/02/22 visit. As he is asymptomatic, will continue strategy of rate control and anticoagulation. F/u CBC/BMET today to ensure stable for Eliquis dosing. Risk/benefit discussion again broached. For now, no specific contraindications to anticoagulation have been identified by outside teams but continue to re-evaluate at each visit. Continue usual EP followup as previously recommended. Will cc to Dr. Lovena Walker so he is aware.  3. Chronic diastolic CHF w/ pulm HTN by echo 2022 - will decrease Lasix dose as above and follow volume status.   4. Thoracic aortic aneurysm, mild AI - 5.1cm by recent CTA.  I will defer to primary cardiologist at next follow-up as to whether he plans to follow this. I anticipate a more conservative approach is most appropriate. Discussed with son avoidance of heavy lifting as well as screening in family members. BP is no longer high.  5. Aortic/coronary atherosclerosis - no recent angina. Had ER visit with SOB 08/29/22 which EDP felt workup was reassuring. Troponin minimally elevated and flat without other signs of ACS. Son felt this was provoked by anxiety. He was potentially on the dry side. Will continue to follow conservatively with symptom-based approach rather than invasive assessments where harm may do more than good. He is not on ASA with concomitant Eliquis. I will defer consideration of alternative statin therapy to his primary care team. Do not suspect long term benefit outweighs risk of polypharmacy in this case.    Disposition: F/u with Dr. Tamala Julian or me in 3-4 months.   Medication Adjustments/Labs and Tests Ordered: Current medicines are reviewed at length with the patient today.  Concerns regarding medicines are outlined above. Medication changes, Labs and Tests ordered today are summarized above and listed in the Patient Instructions accessible in Encounters.  Signed, Charlie Pitter, PA-C   09/23/2022 2:13 PM    [place] Phone: [phone]; Fax: [phone]

## 2022-09-24 ENCOUNTER — Other Ambulatory Visit: Payer: Self-pay

## 2022-09-24 DIAGNOSIS — I77810 Thoracic aortic ectasia: Secondary | ICD-10-CM

## 2022-09-24 DIAGNOSIS — I959 Hypotension, unspecified: Secondary | ICD-10-CM

## 2022-09-24 DIAGNOSIS — I4819 Other persistent atrial fibrillation: Secondary | ICD-10-CM

## 2022-09-24 DIAGNOSIS — I442 Atrioventricular block, complete: Secondary | ICD-10-CM

## 2022-09-24 DIAGNOSIS — I5032 Chronic diastolic (congestive) heart failure: Secondary | ICD-10-CM

## 2022-09-24 DIAGNOSIS — I495 Sick sinus syndrome: Secondary | ICD-10-CM

## 2022-09-24 LAB — BASIC METABOLIC PANEL
BUN/Creatinine Ratio: 19 (ref 10–24)
BUN: 34 mg/dL (ref 10–36)
CO2: 26 mmol/L (ref 20–29)
Calcium: 9.7 mg/dL (ref 8.6–10.2)
Chloride: 101 mmol/L (ref 96–106)
Creatinine, Ser: 1.82 mg/dL — ABNORMAL HIGH (ref 0.76–1.27)
Glucose: 94 mg/dL (ref 70–99)
Potassium: 4.1 mmol/L (ref 3.5–5.2)
Sodium: 140 mmol/L (ref 134–144)
eGFR: 34 mL/min/{1.73_m2} — ABNORMAL LOW (ref >59)

## 2022-09-24 LAB — CBC
Hematocrit: 35.8 % — ABNORMAL LOW (ref 37.5–51.0)
Hemoglobin: 11.8 g/dL — ABNORMAL LOW (ref 13.0–17.7)
MCH: 32.4 pg (ref 26.6–33.0)
MCHC: 33 g/dL (ref 31.5–35.7)
MCV: 98 fL — ABNORMAL HIGH (ref 79–97)
Platelets: 103 10*3/uL — ABNORMAL LOW (ref 150–450)
RBC: 3.64 x10E6/uL — ABNORMAL LOW (ref 4.14–5.80)
RDW: 13.9 % (ref 11.6–15.4)
WBC: 8 10*3/uL (ref 3.4–10.8)

## 2022-09-24 LAB — TSH: TSH: 4.78 u[IU]/mL — ABNORMAL HIGH (ref 0.450–4.500)

## 2022-09-24 MED ORDER — APIXABAN 2.5 MG PO TABS: 2.5000 mg | ORAL_TABLET | Freq: Two times a day (BID) | ORAL | 3 refills | Status: DC

## 2022-09-25 DIAGNOSIS — R338 Other retention of urine: Secondary | ICD-10-CM | POA: Diagnosis not present

## 2022-09-30 ENCOUNTER — Ambulatory Visit: Payer: Medicare Other | Attending: Internal Medicine

## 2022-09-30 DIAGNOSIS — I5032 Chronic diastolic (congestive) heart failure: Secondary | ICD-10-CM

## 2022-09-30 DIAGNOSIS — I442 Atrioventricular block, complete: Secondary | ICD-10-CM | POA: Diagnosis not present

## 2022-09-30 DIAGNOSIS — I4819 Other persistent atrial fibrillation: Secondary | ICD-10-CM | POA: Diagnosis not present

## 2022-09-30 DIAGNOSIS — I495 Sick sinus syndrome: Secondary | ICD-10-CM

## 2022-09-30 DIAGNOSIS — I77810 Thoracic aortic ectasia: Secondary | ICD-10-CM | POA: Diagnosis not present

## 2022-09-30 DIAGNOSIS — I959 Hypotension, unspecified: Secondary | ICD-10-CM

## 2022-09-30 LAB — BASIC METABOLIC PANEL
BUN/Creatinine Ratio: 17 (ref 10–24)
BUN: 27 mg/dL (ref 10–36)
CO2: 27 mmol/L (ref 20–29)
Calcium: 9.4 mg/dL (ref 8.6–10.2)
Chloride: 104 mmol/L (ref 96–106)
Creatinine, Ser: 1.57 mg/dL — ABNORMAL HIGH (ref 0.76–1.27)
Glucose: 146 mg/dL — ABNORMAL HIGH (ref 70–99)
Potassium: 3.8 mmol/L (ref 3.5–5.2)
Sodium: 144 mmol/L (ref 134–144)
eGFR: 40 mL/min/{1.73_m2} — ABNORMAL LOW (ref >59)

## 2022-10-13 ENCOUNTER — Other Ambulatory Visit: Payer: Self-pay

## 2022-10-13 ENCOUNTER — Inpatient Hospital Stay (HOSPITAL_COMMUNITY)
Admission: EM | Admit: 2022-10-13 | Discharge: 2022-10-22 | DRG: 698 | Disposition: A | Discharge status: Home Health | Payer: Medicare Other | Attending: Family Medicine | Admitting: Family Medicine

## 2022-10-13 ENCOUNTER — Encounter (HOSPITAL_COMMUNITY): Payer: Self-pay | Admitting: Emergency Medicine

## 2022-10-13 ENCOUNTER — Emergency Department (HOSPITAL_COMMUNITY): Payer: Medicare Other

## 2022-10-13 DIAGNOSIS — F0390 Unspecified dementia without behavioral disturbance: Secondary | ICD-10-CM | POA: Diagnosis present

## 2022-10-13 DIAGNOSIS — Z1612 Extended spectrum beta lactamase (ESBL) resistance: Secondary | ICD-10-CM | POA: Diagnosis not present

## 2022-10-13 DIAGNOSIS — E785 Hyperlipidemia, unspecified: Secondary | ICD-10-CM | POA: Diagnosis present

## 2022-10-13 DIAGNOSIS — N189 Chronic kidney disease, unspecified: Secondary | ICD-10-CM | POA: Diagnosis present

## 2022-10-13 DIAGNOSIS — C673 Malignant neoplasm of anterior wall of bladder: Secondary | ICD-10-CM | POA: Diagnosis present

## 2022-10-13 DIAGNOSIS — I480 Paroxysmal atrial fibrillation: Secondary | ICD-10-CM | POA: Diagnosis present

## 2022-10-13 DIAGNOSIS — Z87440 Personal history of urinary (tract) infections: Secondary | ICD-10-CM

## 2022-10-13 DIAGNOSIS — D696 Thrombocytopenia, unspecified: Secondary | ICD-10-CM | POA: Diagnosis not present

## 2022-10-13 DIAGNOSIS — I714 Abdominal aortic aneurysm, without rupture, unspecified: Secondary | ICD-10-CM | POA: Diagnosis present

## 2022-10-13 DIAGNOSIS — N1832 Chronic kidney disease, stage 3b: Secondary | ICD-10-CM | POA: Diagnosis present

## 2022-10-13 DIAGNOSIS — G629 Polyneuropathy, unspecified: Secondary | ICD-10-CM | POA: Diagnosis present

## 2022-10-13 DIAGNOSIS — Z7901 Long term (current) use of anticoagulants: Secondary | ICD-10-CM | POA: Diagnosis not present

## 2022-10-13 DIAGNOSIS — R55 Syncope and collapse: Secondary | ICD-10-CM | POA: Diagnosis not present

## 2022-10-13 DIAGNOSIS — I130 Hypertensive heart and chronic kidney disease with heart failure and stage 1 through stage 4 chronic kidney disease, or unspecified chronic kidney disease: Secondary | ICD-10-CM | POA: Diagnosis not present

## 2022-10-13 DIAGNOSIS — I959 Hypotension, unspecified: Secondary | ICD-10-CM | POA: Diagnosis not present

## 2022-10-13 DIAGNOSIS — Z87891 Personal history of nicotine dependence: Secondary | ICD-10-CM

## 2022-10-13 DIAGNOSIS — N3090 Cystitis, unspecified without hematuria: Secondary | ICD-10-CM | POA: Diagnosis not present

## 2022-10-13 DIAGNOSIS — G894 Chronic pain syndrome: Secondary | ICD-10-CM | POA: Diagnosis present

## 2022-10-13 DIAGNOSIS — R1111 Vomiting without nausea: Secondary | ICD-10-CM | POA: Diagnosis not present

## 2022-10-13 DIAGNOSIS — D61818 Other pancytopenia: Secondary | ICD-10-CM | POA: Diagnosis not present

## 2022-10-13 DIAGNOSIS — R9431 Abnormal electrocardiogram [ECG] [EKG]: Secondary | ICD-10-CM | POA: Diagnosis not present

## 2022-10-13 DIAGNOSIS — I712 Thoracic aortic aneurysm, without rupture, unspecified: Secondary | ICD-10-CM | POA: Diagnosis present

## 2022-10-13 DIAGNOSIS — Z66 Do not resuscitate: Secondary | ICD-10-CM | POA: Diagnosis present

## 2022-10-13 DIAGNOSIS — T83518A Infection and inflammatory reaction due to other urinary catheter, initial encounter: Principal | ICD-10-CM | POA: Diagnosis present

## 2022-10-13 DIAGNOSIS — I4819 Other persistent atrial fibrillation: Secondary | ICD-10-CM | POA: Diagnosis present

## 2022-10-13 DIAGNOSIS — Z8551 Personal history of malignant neoplasm of bladder: Secondary | ICD-10-CM

## 2022-10-13 DIAGNOSIS — R0902 Hypoxemia: Secondary | ICD-10-CM | POA: Diagnosis not present

## 2022-10-13 DIAGNOSIS — R6520 Severe sepsis without septic shock: Secondary | ICD-10-CM | POA: Diagnosis present

## 2022-10-13 DIAGNOSIS — M1990 Unspecified osteoarthritis, unspecified site: Secondary | ICD-10-CM | POA: Diagnosis present

## 2022-10-13 DIAGNOSIS — Z888 Allergy status to other drugs, medicaments and biological substances status: Secondary | ICD-10-CM

## 2022-10-13 DIAGNOSIS — I69351 Hemiplegia and hemiparesis following cerebral infarction affecting right dominant side: Secondary | ICD-10-CM

## 2022-10-13 DIAGNOSIS — D631 Anemia in chronic kidney disease: Secondary | ICD-10-CM | POA: Diagnosis present

## 2022-10-13 DIAGNOSIS — N401 Enlarged prostate with lower urinary tract symptoms: Secondary | ICD-10-CM | POA: Diagnosis present

## 2022-10-13 DIAGNOSIS — R1110 Vomiting, unspecified: Secondary | ICD-10-CM

## 2022-10-13 DIAGNOSIS — I272 Pulmonary hypertension, unspecified: Secondary | ICD-10-CM | POA: Diagnosis not present

## 2022-10-13 DIAGNOSIS — R338 Other retention of urine: Secondary | ICD-10-CM | POA: Diagnosis not present

## 2022-10-13 DIAGNOSIS — Z7189 Other specified counseling: Secondary | ICD-10-CM | POA: Diagnosis not present

## 2022-10-13 DIAGNOSIS — D649 Anemia, unspecified: Secondary | ICD-10-CM | POA: Diagnosis not present

## 2022-10-13 DIAGNOSIS — Z882 Allergy status to sulfonamides status: Secondary | ICD-10-CM

## 2022-10-13 DIAGNOSIS — Y846 Urinary catheterization as the cause of abnormal reaction of the patient, or of later complication, without mention of misadventure at the time of the procedure: Secondary | ICD-10-CM | POA: Diagnosis present

## 2022-10-13 DIAGNOSIS — I495 Sick sinus syndrome: Secondary | ICD-10-CM | POA: Diagnosis present

## 2022-10-13 DIAGNOSIS — R310 Gross hematuria: Secondary | ICD-10-CM | POA: Diagnosis present

## 2022-10-13 DIAGNOSIS — Z85828 Personal history of other malignant neoplasm of skin: Secondary | ICD-10-CM

## 2022-10-13 DIAGNOSIS — Z7952 Long term (current) use of systemic steroids: Secondary | ICD-10-CM

## 2022-10-13 DIAGNOSIS — Z978 Presence of other specified devices: Secondary | ICD-10-CM

## 2022-10-13 DIAGNOSIS — E872 Acidosis, unspecified: Secondary | ICD-10-CM | POA: Diagnosis present

## 2022-10-13 DIAGNOSIS — R0689 Other abnormalities of breathing: Secondary | ICD-10-CM | POA: Diagnosis not present

## 2022-10-13 DIAGNOSIS — N3289 Other specified disorders of bladder: Secondary | ICD-10-CM | POA: Diagnosis not present

## 2022-10-13 DIAGNOSIS — I5032 Chronic diastolic (congestive) heart failure: Secondary | ICD-10-CM | POA: Diagnosis present

## 2022-10-13 DIAGNOSIS — I9589 Other hypotension: Secondary | ICD-10-CM | POA: Diagnosis not present

## 2022-10-13 DIAGNOSIS — E039 Hypothyroidism, unspecified: Secondary | ICD-10-CM | POA: Diagnosis not present

## 2022-10-13 DIAGNOSIS — Z792 Long term (current) use of antibiotics: Secondary | ICD-10-CM

## 2022-10-13 DIAGNOSIS — R531 Weakness: Secondary | ICD-10-CM | POA: Diagnosis not present

## 2022-10-13 DIAGNOSIS — Z950 Presence of cardiac pacemaker: Secondary | ICD-10-CM

## 2022-10-13 DIAGNOSIS — Z515 Encounter for palliative care: Secondary | ICD-10-CM | POA: Diagnosis not present

## 2022-10-13 DIAGNOSIS — N390 Urinary tract infection, site not specified: Secondary | ICD-10-CM | POA: Diagnosis present

## 2022-10-13 DIAGNOSIS — A419 Sepsis, unspecified organism: Secondary | ICD-10-CM | POA: Diagnosis present

## 2022-10-13 DIAGNOSIS — N179 Acute kidney failure, unspecified: Secondary | ICD-10-CM | POA: Diagnosis not present

## 2022-10-13 DIAGNOSIS — Z821 Family history of blindness and visual loss: Secondary | ICD-10-CM

## 2022-10-13 DIAGNOSIS — R112 Nausea with vomiting, unspecified: Secondary | ICD-10-CM | POA: Diagnosis present

## 2022-10-13 DIAGNOSIS — Z79899 Other long term (current) drug therapy: Secondary | ICD-10-CM

## 2022-10-13 DIAGNOSIS — K219 Gastro-esophageal reflux disease without esophagitis: Secondary | ICD-10-CM | POA: Diagnosis present

## 2022-10-13 DIAGNOSIS — Z79890 Hormone replacement therapy (postmenopausal): Secondary | ICD-10-CM

## 2022-10-13 LAB — URINALYSIS, ROUTINE W REFLEX MICROSCOPIC
Bilirubin Urine: NEGATIVE
Glucose, UA: NEGATIVE mg/dL
Ketones, ur: NEGATIVE mg/dL
Nitrite: NEGATIVE
Protein, ur: 100 mg/dL — AB
RBC / HPF: >50 RBC/hpf — ABNORMAL HIGH (ref 0–5)
Specific Gravity, Urine: 1.02 (ref 1.005–1.030)
WBC, UA: >50 WBC/hpf — ABNORMAL HIGH (ref 0–5)
pH: 7 (ref 5.0–8.0)

## 2022-10-13 LAB — CBC WITH DIFFERENTIAL/PLATELET
Abs Immature Granulocytes: 1.57 10*3/uL — ABNORMAL HIGH (ref 0.00–0.07)
Basophils Absolute: 0.1 10*3/uL (ref 0.0–0.1)
Basophils Relative: 0 %
Eosinophils Absolute: 0 10*3/uL (ref 0.0–0.5)
Eosinophils Relative: 0 %
HCT: 35.7 % — ABNORMAL LOW (ref 39.0–52.0)
Hemoglobin: 11.8 g/dL — ABNORMAL LOW (ref 13.0–17.0)
Immature Granulocytes: 5 %
Lymphocytes Relative: 3 %
Lymphs Abs: 0.9 10*3/uL (ref 0.7–4.0)
MCH: 32.6 pg (ref 26.0–34.0)
MCHC: 33.1 g/dL (ref 30.0–36.0)
MCV: 98.6 fL (ref 80.0–100.0)
Monocytes Absolute: 2.9 10*3/uL — ABNORMAL HIGH (ref 0.1–1.0)
Monocytes Relative: 10 %
Neutro Abs: 23.8 10*3/uL — ABNORMAL HIGH (ref 1.7–7.7)
Neutrophils Relative %: 82 %
Platelets: 80 10*3/uL — ABNORMAL LOW (ref 150–400)
RBC: 3.62 MIL/uL — ABNORMAL LOW (ref 4.22–5.81)
RDW: 16.5 % — ABNORMAL HIGH (ref 11.5–15.5)
WBC: 29.3 10*3/uL — ABNORMAL HIGH (ref 4.0–10.5)
nRBC: 0 % (ref 0.0–0.2)

## 2022-10-13 LAB — COMPREHENSIVE METABOLIC PANEL
ALT: 24 U/L (ref 0–44)
AST: 24 U/L (ref 15–41)
Albumin: 3.3 g/dL — ABNORMAL LOW (ref 3.5–5.0)
Alkaline Phosphatase: 69 U/L (ref 38–126)
Anion gap: 13 (ref 5–15)
BUN: 47 mg/dL — ABNORMAL HIGH (ref 8–23)
CO2: 21 mmol/L — ABNORMAL LOW (ref 22–32)
Calcium: 8.8 mg/dL — ABNORMAL LOW (ref 8.9–10.3)
Chloride: 101 mmol/L (ref 98–111)
Creatinine, Ser: 2.77 mg/dL — ABNORMAL HIGH (ref 0.61–1.24)
GFR, Estimated: 20 mL/min — ABNORMAL LOW (ref >60)
Glucose, Bld: 110 mg/dL — ABNORMAL HIGH (ref 70–99)
Potassium: 3.9 mmol/L (ref 3.5–5.1)
Sodium: 135 mmol/L (ref 135–145)
Total Bilirubin: 0.8 mg/dL (ref 0.3–1.2)
Total Protein: 6.7 g/dL (ref 6.5–8.1)

## 2022-10-13 LAB — PROTIME-INR
INR: 1.1 (ref 0.8–1.2)
Prothrombin Time: 14.4 seconds (ref 11.4–15.2)

## 2022-10-13 LAB — LACTIC ACID, PLASMA
Lactic Acid, Venous: 2.4 mmol/L (ref 0.5–1.9)
Lactic Acid, Venous: 1.8 mmol/L (ref 0.5–1.9)

## 2022-10-13 LAB — APTT: aPTT: 35 seconds (ref 24–36)

## 2022-10-13 MED ORDER — TRAMADOL HCL 50 MG PO TABS
50.0000 mg | ORAL_TABLET | Freq: Every day | ORAL | Status: DC
  Administered 2022-10-13 – 2022-10-21 (×9): 50 mg via ORAL
  Filled 2022-10-13 (×9): qty 1

## 2022-10-13 MED ORDER — SODIUM CHLORIDE 0.9 % IV SOLN
1.0000 g | Freq: Once | INTRAVENOUS | Status: AC
  Administered 2022-10-13: 1 g via INTRAVENOUS
  Filled 2022-10-13: qty 20

## 2022-10-13 MED ORDER — PREGABALIN 50 MG PO CAPS
50.0000 mg | ORAL_CAPSULE | Freq: Every day | ORAL | Status: DC
  Administered 2022-10-13 – 2022-10-21 (×9): 50 mg via ORAL
  Filled 2022-10-13 (×6): qty 1
  Filled 2022-10-13: qty 2
  Filled 2022-10-13 (×2): qty 1

## 2022-10-13 MED ORDER — LACTATED RINGERS IV BOLUS (SEPSIS): 1000.0000 mL | Freq: Once | INTRAVENOUS | Status: DC

## 2022-10-13 MED ORDER — TOPIRAMATE 25 MG PO TABS
50.0000 mg | ORAL_TABLET | Freq: Two times a day (BID) | ORAL | Status: DC
  Administered 2022-10-13 – 2022-10-21 (×17): 50 mg via ORAL
  Filled 2022-10-13 (×20): qty 2

## 2022-10-13 MED ORDER — DULOXETINE HCL 60 MG PO CPEP
60.0000 mg | ORAL_CAPSULE | Freq: Every day | ORAL | Status: DC
  Administered 2022-10-13 – 2022-10-22 (×10): 60 mg via ORAL
  Filled 2022-10-13 (×11): qty 1

## 2022-10-13 MED ORDER — LACTATED RINGERS IV SOLN: INTRAVENOUS | Status: AC

## 2022-10-13 MED ORDER — ACETAMINOPHEN 325 MG PO TABS
650.0000 mg | ORAL_TABLET | Freq: Four times a day (QID) | ORAL | Status: DC | PRN
  Administered 2022-10-19 – 2022-10-21 (×2): 650 mg via ORAL
  Filled 2022-10-13 (×2): qty 2

## 2022-10-13 MED ORDER — SODIUM CHLORIDE 0.9 % IV BOLUS (SEPSIS)
1000.0000 mL | Freq: Once | INTRAVENOUS | Status: AC
  Administered 2022-10-13: 1000 mL via INTRAVENOUS

## 2022-10-13 MED ORDER — LEVOTHYROXINE SODIUM 25 MCG PO TABS
25.0000 ug | ORAL_TABLET | Freq: Every day | ORAL | Status: DC
  Administered 2022-10-14 – 2022-10-22 (×9): 25 ug via ORAL
  Filled 2022-10-13 (×10): qty 1

## 2022-10-13 MED ORDER — ALBUTEROL SULFATE (2.5 MG/3ML) 0.083% IN NEBU: 2.5000 mg | INHALATION_SOLUTION | Freq: Four times a day (QID) | RESPIRATORY_TRACT | Status: DC | PRN

## 2022-10-13 MED ORDER — APIXABAN 2.5 MG PO TABS
2.5000 mg | ORAL_TABLET | Freq: Two times a day (BID) | ORAL | Status: DC
  Administered 2022-10-13 – 2022-10-14 (×2): 2.5 mg via ORAL
  Filled 2022-10-13 (×2): qty 1

## 2022-10-13 MED ORDER — TRIMETHOBENZAMIDE HCL 100 MG/ML IM SOLN: 200.0000 mg | Freq: Four times a day (QID) | INTRAMUSCULAR | Status: DC | PRN

## 2022-10-13 MED ORDER — PREDNISONE 5 MG PO TABS
5.0000 mg | ORAL_TABLET | Freq: Every day | ORAL | Status: DC
  Administered 2022-10-13 – 2022-10-22 (×10): 5 mg via ORAL
  Filled 2022-10-13 (×11): qty 1

## 2022-10-13 MED ORDER — POLYETHYLENE GLYCOL 3350 17 G PO PACK: 17.0000 g | PACK | Freq: Every day | ORAL | Status: DC | PRN

## 2022-10-13 MED ORDER — ERYTHROMYCIN 5 MG/GM OP OINT: 1.0000 | TOPICAL_OINTMENT | Freq: Every day | OPHTHALMIC | Status: DC | PRN

## 2022-10-13 MED ORDER — ACETAMINOPHEN 650 MG RE SUPP: 650.0000 mg | Freq: Four times a day (QID) | RECTAL | Status: DC | PRN

## 2022-10-13 MED ORDER — SODIUM CHLORIDE 0.9 % IV SOLN
500.0000 mg | Freq: Two times a day (BID) | INTRAVENOUS | Status: AC
  Administered 2022-10-14 – 2022-10-16 (×6): 500 mg via INTRAVENOUS
  Filled 2022-10-13 (×9): qty 10

## 2022-10-13 MED ORDER — LORATADINE 10 MG PO TABS
10.0000 mg | ORAL_TABLET | Freq: Every day | ORAL | Status: DC
  Administered 2022-10-13 – 2022-10-22 (×10): 10 mg via ORAL
  Filled 2022-10-13 (×11): qty 1

## 2022-10-13 MED ORDER — SODIUM CHLORIDE 0.9% FLUSH
3.0000 mL | Freq: Two times a day (BID) | INTRAVENOUS | Status: DC
  Administered 2022-10-14 – 2022-10-21 (×11): 3 mL via INTRAVENOUS

## 2022-10-13 MED ORDER — LACTATED RINGERS IV SOLN: INTRAVENOUS | Status: DC

## 2022-10-13 MED ORDER — MAGNESIUM HYDROXIDE 400 MG/5ML PO SUSP: 30.0000 mL | Freq: Every day | ORAL | Status: DC | PRN

## 2022-10-13 MED ORDER — LACTATED RINGERS IV BOLUS (SEPSIS): 500.0000 mL | Freq: Once | INTRAVENOUS | Status: DC

## 2022-10-13 NOTE — ED Provider Notes (Signed)
[facility] EMERGENCY DEPARTMENT Provider Note   CSN: [id number] Arrival date & time: 10/13/22  1325     History  Chief Complaint  Patient presents with   Emesis   Fatigue    Douglas Walker is a 88 y.o. male who presents with a cc of hypotension.  He has an extensive past medical history including chronic indwelling catheter, history of recurrent urinary tract infections on Macrobid followed by infectious disease and urology, history of bacteremia and resistant/ESBL infections of the urine. History is gathered by review of EMR, patient's son at bedside, and EMS.  Patient also has a history of previous stroke with right-sided deficits.  He is apparently at cognitive baseline.  EMS reports that patient was picked up from the [facility] walk[facility]in [facility].  He was taken there today for fever.  He had a blood pressure systolic in the 40J noted by the EMS.  He was given 250 of fluid with improvement to systolic of 81X prior to arrival.  [place] he had several episodes of vomiting.  He had shaking chills and a fever up to 102 Fahrenheit treated with Tylenol.  Son states that he was concerned he might have aspirated due to episodes of vomiting last night and because he had some rhonchi in his chest today.  Patient is due for catheter change on 10/16/2022.    Emesis Associated symptoms: chills and fever        Home Medications Prior to Admission medications   Medication Sig Start Date End Date Taking? Authorizing Provider  acetaminophen (TYLENOL) 325 MG tablet Take 2 tablets (650 mg total) by mouth every 4 (four) hours as needed for mild pain (or temp > 37.5 C (99.5 F)). 02/14/21   Angiulli, Lavon Paganini, PA-C  apixaban (ELIQUIS) 2.5 MG TABS tablet Take 1 tablet (2.5 mg total) by mouth 2 (two) times daily. 09/24/22   Dunn, Nedra Hai, PA-C  Ascorbic Acid (VITAMIN C) 1000 MG tablet Take 1,000 mg by mouth daily.    [provider]  DULoxetine (CYMBALTA) 60 MG capsule Take 1  capsule (60 mg total) by mouth daily. 04/23/21   Lovorn, Jinny Blossom, MD  erythromycin ophthalmic ointment SMARTSIG:In Eye(s) 09/05/22   [provider]  furosemide (LASIX) 20 MG tablet Take 2 tablets (40 mg total) by mouth daily. You amy take 1 additional tablet daily as needed for swelling if top bp number over 105 09/23/22   Dunn, Nedra Hai, PA-C  levothyroxine (SYNTHROID) 25 MCG tablet Take 1 tablet (25 mcg total) by mouth daily before breakfast. 02/14/21   Angiulli, Lavon Paganini, PA-C  loratadine (CLARITIN) 10 MG tablet Take 10 mg by mouth daily.    [provider]  magnesium hydroxide (MILK OF MAGNESIA) 400 MG/5ML suspension Take 30 mLs by mouth daily as needed for mild constipation.    [provider]  Multiple Vitamin (MULTIVITAMIN WITH MINERALS) TABS tablet Take 1 tablet by mouth daily.    [provider]  Multiple Vitamins-Minerals (OCUVITE ADULT 50+) CAPS Take 1 capsule by mouth daily.    [provider]  nitrofurantoin, macrocrystal-monohydrate, (MACROBID) 100 MG capsule Take 100 mg by mouth daily. Continuous course.    [provider]  polyethylene glycol (MIRALAX / GLYCOLAX) 17 g packet Take 17 g by mouth daily. Patient taking differently: Take 17 g by mouth daily as needed for mild constipation. 07/08/21   Mariel Aloe, MD  predniSONE (DELTASONE) 5 MG tablet Take 5 mg by mouth daily. 09/12/22  [provider]  pregabalin (LYRICA) 50 MG capsule Take 50 mg by mouth at bedtime.    [provider]  primidone (MYSOLINE) 250 MG tablet Take 1 tablet (250 mg total) by mouth 2 (two) times daily. 02/14/21   Angiulli, Lavon Paganini, PA-C  tobramycin-dexamethasone [facility]) ophthalmic solution Place into the right eye. 09/07/22   [provider]  topiramate (TOPAMAX) 50 MG tablet Take 1 tablet (50 mg total) by mouth 2 (two) times daily. 02/27/22   Ward Givens, NP  vitamin B-12 (CYANOCOBALAMIN) 1000 MCG tablet Take 1 tablet (1,000 mcg  total) by mouth every other day. Patient taking differently: Take 1,000 mcg by mouth See admin instructions. 1082mg oral daily on Sunday, Tuesday, Wednesday, Thursday, repeat. 02/14/21   Angiulli, DLavon Paganini PA-C      Allergies    Sulfa antibiotics, Mysoline [primidone], and Zocor [simvastatin]    Review of Systems   Review of Systems  Constitutional:  Positive for chills and fever.  Gastrointestinal:  Positive for vomiting.    Physical Exam Updated Vital Signs BP (!) 101/59   Pulse 95   Temp 98.9 F (37.2 C) (Rectal)   Resp 20   Ht '5\' 9"'$  (1.753 m)   Wt 83 kg   SpO2 93%   BMI 27.02 kg/m  Physical Exam Vitals and nursing note reviewed.  Constitutional:      General: He is not in acute distress.    Appearance: He is well-developed. He is ill-appearing. He is not diaphoretic.  HENT:     Head: Normocephalic and atraumatic.  Eyes:     General: No scleral icterus.    Comments:    Cardiovascular:     Rate and Rhythm: Normal rate and regular rhythm.     Heart sounds: Normal heart sounds.  Pulmonary:     Effort: Pulmonary effort is normal. No respiratory distress.     Breath sounds: Normal breath sounds.  Abdominal:     Palpations: Abdomen is soft.     Tenderness: There is abdominal tenderness (suprapubic tenderness).  Musculoskeletal:     Cervical back: Normal range of motion and neck supple.  Skin:    General: Skin is warm and dry.  Neurological:     Mental Status: He is alert. Mental status is at baseline.     Cranial Nerves: No cranial nerve deficit.  Psychiatric:        Behavior: Behavior normal.     ED Results / Procedures / Treatments   Labs (all labs ordered are listed, but only abnormal results are displayed) Labs Reviewed  LACTIC ACID, PLASMA - Abnormal; Notable for the following components:      Result Value   Lactic Acid, Venous 2.4 (*)    All other components within normal limits  COMPREHENSIVE METABOLIC PANEL - Abnormal; Notable for the following  components:   CO2 21 (*)    Glucose, Bld 110 (*)    BUN 47 (*)    Creatinine, Ser 2.77 (*)    Calcium 8.8 (*)    Albumin 3.3 (*)    GFR, Estimated 20 (*)    All other components within normal limits  CBC WITH DIFFERENTIAL/PLATELET - Abnormal; Notable for the following components:   WBC 29.3 (*)    RBC 3.62 (*)    Hemoglobin 11.8 (*)    HCT 35.7 (*)    RDW 16.5 (*)    Platelets 80 (*)    Neutro Abs 23.8 (*)    Monocytes Absolute 2.9 (*)  Abs Immature Granulocytes 1.57 (*)    All other components within normal limits  URINALYSIS, ROUTINE W REFLEX MICROSCOPIC - Abnormal; Notable for the following components:   Color, Urine AMBER (*)    APPearance TURBID (*)    Hgb urine dipstick MODERATE (*)    Protein, ur 100 (*)    Leukocytes,Ua MODERATE (*)    RBC / HPF >50 (*)    WBC, UA >50 (*)    Bacteria, UA FEW (*)    All other components within normal limits  CULTURE, BLOOD (ROUTINE X 2)  CULTURE, BLOOD (ROUTINE X 2)  URINE CULTURE  PROTIME-INR  APTT  LACTIC ACID, PLASMA    EKG EKG Interpretation  Date/Time:  Sunday October 13 2022 13:29:04 EDT Ventricular Rate:  70 PR Interval:    QRS Duration: 126 QT Interval:  469 QTC Calculation: 507 R Axis:   19 Text Interpretation: VENTRICULAR PACING No significant change since last tracing Confirmed by Pattricia Boss [phone]) on 10/13/2022 3:23:17 PM  Radiology DG Chest Port 1 View  Result Date: 10/13/2022 CLINICAL DATA:  Sepsis EXAM: PORTABLE CHEST 1 VIEW COMPARISON:  08/29/2022 FINDINGS: Cardiomegaly with left chest multi lead pacer. Both lungs are clear. The visualized skeletal structures are unremarkable. IMPRESSION: Cardiomegaly without acute abnormality of the lungs in AP portable projection. Electronically Signed   By: Delanna Ahmadi M.D.   On: 10/13/2022 14:54    Procedures .Critical Care  Performed by: Margarita Mail, PA-C Authorized by: Margarita Mail, PA-C   Critical care provider statement:    Critical care time  (minutes):  75   Critical care time was exclusive of:  Separately billable procedures and treating other patients   Critical care was necessary to treat or prevent imminent or life-threatening deterioration of the following conditions:  Sepsis, shock and dehydration   Critical care was time spent personally by me on the following activities:  Development of treatment plan with patient or surrogate, discussions with consultants, evaluation of patient's response to treatment, examination of patient, ordering and review of laboratory studies, ordering and review of radiographic studies, ordering and performing treatments and interventions, pulse oximetry, re-evaluation of patient's condition and review of old charts     Medications Ordered in ED Medications  lactated ringers infusion (75 mL/hr Intravenous Rate/Dose Change 10/13/22 1612)  sodium chloride flush (NS) 0.9 % injection 3 mL (has no administration in time range)  acetaminophen (TYLENOL) tablet 650 mg (has no administration in time range)    Or  acetaminophen (TYLENOL) suppository 650 mg (has no administration in time range)  albuterol (PROVENTIL) (2.5 MG/3ML) 0.083% nebulizer solution 2.5 mg (has no administration in time range)  meropenem (MERREM) 500 mg in sodium chloride 0.9 % 100 mL IVPB (has no administration in time range)  sodium chloride 0.9 % bolus 1,000 mL (0 mLs Intravenous Stopped 10/13/22 1612)  meropenem (MERREM) 1 g in sodium chloride 0.9 % 100 mL IVPB (0 g Intravenous Stopped 10/13/22 1613)    ED Course/ Medical Decision Making/ A&P Clinical Course as of 10/13/22 1651  Sun Oct 13, 2022  1521 WBC(!): 29.3 Markedly elevated white blood cell count and thrombocytopenia [AH]  1521 Platelets(!): 80 [AH]  1522 Creatinine(!): 2.77 [AH]  1522 BUN(!): 47 AKI [AH]    Clinical Course User Index [AH] Margarita Mail, PA-C                           Medical Decision Making This patient presents to  the ED for concern of  fever and hypotension + vomiting, this involves an extensive number of treatment options, and is a complaint that carries with it a high risk of complications and morbidity.  The emergent differential diagnosis for vomiting includes, but is not limited to ACS/MI, DKA, Ischemic bowel, Meningitis, Sepsis, Acute gastric dilation, Adrenal insufficiency, Appendicitis,  Bowel obstruction/ileus, Carbon monoxide poisoning, Cholecystitis, Electrolyte abnormalities, Elevated ICP, Gastric outlet obstruction, Pancreatitis, Ruptured viscus, Biliary colic, Cannabinoid hyperemesis syndrome, Gastritis, Gastroenteritis, Gastroparesis,  Narcotic withdrawal, Peptic ulcer disease, and UTI    Co morbidities that complicate the patient evaluation       Chronic indwelling Foley catheter, you recurrent urinary tract infections with resistant bacteria is   Additional history obtained:  Additional history obtained from son at bedside Outside records reviewed include staying recent outpatient visits for oculoplastics    Lab Tests:  I Ordered, and personally interpreted labs.  The pertinent results include: As reviewed in ED course   Imaging Studies ordered:  I ordered imaging studies including chest x-ray I independently visualized and interpreted imaging which showed cardiomegaly without evidence of pulmonary edema I agree with the radiologist interpretation   Cardiac Monitoring:       The patient was maintained on a cardiac monitor.  I personally viewed and interpreted the cardiac monitored which showed an underlying rhythm of: Paced rhythm   Medicines ordered and prescription drug management:  I ordered medication including meropenem fluids for urosepsis Reevaluation of the patient after these medicines showed that the patient improved I have reviewed the patients home medicines and have made adjustments as needed   Test Considered:       CT renal stone study to rule out obstruction however  bladder scan negative   Critical Interventions:       Fluid resuscitation for hypotension   Consultations Obtained:  I requested consultation with the [facility] hospitalist, Dr. Tamala Julian,  and discussed lab and imaging findings as well as pertinent plan - they recommend: Admission   Problem List / ED Course:       (N17.9) AKI (acute kidney injury) ([place])  (primary encounter diagnosis)  (R11.10) Vomiting, unspecified vomiting type, unspecified whether nausea present  (A41.9,  N39.0) Sepsis secondary to UTI ([place])  (T83.518A,  N30.90) Catheter cystitis, initial encounter ([place])  (D69.6) Thrombocytopenia ([place])     Reevaluation:  After the interventions noted above, I reevaluated the patient and found that they have :improved   Social Determinants of Health:       lives with son   Dispostion:  After consideration of the diagnostic results and the patients response to treatment, I feel that the patent would benefit from admission .    Amount and/or Complexity of Data Reviewed Labs: ordered. Decision-making details documented in ED Course. Radiology: ordered. ECG/medicine tests: ordered.  Risk Prescription drug management.           Final Clinical Impression(s) / ED Diagnoses Final diagnoses:  AKI (acute kidney injury) ([place])  Vomiting, unspecified vomiting type, unspecified whether nausea present  Sepsis secondary to UTI [facility])  Catheter cystitis, initial encounter ([place])  Thrombocytopenia [facility])    Rx / DC Orders ED Discharge Orders     None         Margarita Mail, PA-C 10/13/22 1651    Pattricia Boss, MD 10/21/22 1208

## 2022-10-13 NOTE — Progress Notes (Signed)
Pharmacy Antibiotic Note  Douglas Walker is a 88 y.o. male admitted on 10/13/2022 presenting with fever, vomiting and hypotension with concern for sepsis.  Hx of chronic cath with UCx in past growing ESBL E coli and proteus.  Pharmacy has been consulted for Merrem dosing.  Plan: Merrem 1g IV x 1 then 500 mg IV q 12h Monitor renal function, UCx and clinical progression to narrow  Height: '5\' 9"'$  (175.3 cm) Weight: 83 kg (182 lb 15.7 oz) IBW/kg (Calculated) : 70.7  Temp (24hrs), Avg:98.9 F (37.2 C), Min:98.9 F (37.2 C), Max:98.9 F (37.2 C)  Recent Labs  Lab 10/13/22 1356  WBC 29.3*  CREATININE 2.77*  LATICACIDVEN 2.4*    Estimated Creatinine Clearance: 16 mL/min (A) (by C-G formula based on SCr of 2.77 mg/dL (H)).    Allergies  Allergen Reactions   Sulfa Antibiotics Other (See Comments)    Weakness Dizziness  Sweats    Mysoline [Primidone] Other (See Comments)    Sedation    Zocor [Simvastatin] Other (See Comments)    Arthralgias Fatigue    Bertis Ruddy, PharmD Clinical Pharmacist ED Pharmacist Phone # [phone] 10/13/2022 4:22 PM

## 2022-10-13 NOTE — ED Triage Notes (Signed)
Patient BIB [facility] from the [facility]. Patient's family reporting that yesterday he had several episodes of vomiting with a 102 degree fever. There was concern that maybe the patient had aspirated at sometime throughout the night as he has a hx of swallowing difficulties w/ a prior stroke. Per EMS BP at [facility] was 58 systolic w/ lethargy w/ slight improvement to 92/54 after 250 ccs of NS.   CBG 144 HR 70 92% on RA.

## 2022-10-13 NOTE — H&P (Signed)
History and Physical    Patient: Douglas Walker:[id number] DOB: 13-Sep-1925 DOA: 10/13/2022 DOS: the patient was seen and examined on 10/13/2022 PCP: Josetta Huddle, MD  Patient coming from: EMS from urgent care  Chief Complaint:  Chief Complaint  Patient presents with   Emesis   Fatigue   HPI: Douglas Walker is a 88 y.o. male with medical history significant of HTN, HLD, atrial fibrillation, chronic diastolic CHF, sinus node dysfunction s/p pacemaker, history of stroke, anemia, thrombocytopenia, CKD stage IIIb, AAA, hiatal hernia, and GERD who presents with complaints of fever and vomiting.  Patient is obtained mostly from his son and daughter-in-law with whom he lives.  Patient can get around with use of a walker and had been in his normal state of health until around 11:30 PM last night.  He had called them into the room and was noted to have shaking chills.  Over tomorrow and about hour later when the went to check on him he had vomited.  They kept checking on him and his temperature which went up to 102.1 F.  They given him 2 Tylenol.  This morning they taken him to urgent care where patient was noted to have systolic blood pressure in the 50s and he had vomited.  There was concern that he may have aspirated for which she had been brought here to the hospital.  Review of records note he had just recently been seen in the cardiology office on 9/25 and Lasix was decreased to 2 tablets daily due to soft blood pressures  Upon admission to the emergency department patient was noted to be afebrile with blood pressure 99/46 with improvement after IV fluid bolus to 111/57, and all other vital signs maintained.  Labs significant for WBC 29.3, hemoglobin 11.8, platelets 80, BUN 44, creatinine 2.77, and lactic acid 2.4.  Orders have been placed to give the patient  2.5 L of IV fluids, and meropenem.  Review of Systems: As mentioned in the history of present illness. All other systems  reviewed and are negative. Past Medical History:  Diagnosis Date   Anemia    Arthritis    shoulders and back   Cancer (HCC)    skin cancers   Chronic diastolic (congestive) heart failure (HCC)    Chronic low back pain 12/15/2017   CKD (chronic kidney disease) stage 3, GFR 30-59 ml/min (HCC)    Essential tremor 02/28/2016   GERD (gastroesophageal reflux disease)    Hernia    History of hiatal hernia    Hyperlipemia    Hypertension    Macular degeneration    Neuromuscular disorder (HCC)    neuropathy   Paroxysmal atrial fibrillation ([place]) 10/28/2013   Presence of permanent cardiac pacemaker 12/04/2018   Pulmonary hypertension ([place])    Stroke ([place]) 12/2020   Tachycardia-bradycardia syndrome ([place]) 09/13/2014   Thoracic aortic aneurysm (HCC)    Thrombocytopenia (HCC)    TIA (transient ischemic attack)    Past Surgical History:  Procedure Laterality Date   [place] / REPLACE / REMOVE PACEMAKER  12/04/2018   IRRIGATION AND DEBRIDEMENT ABSCESS Right 07/24/2020   Procedure: IRRIGATION AND DEBRIDEMENT HEMATOMA;  Surgeon: Coralie Keens, MD;  Location: [facility];  Service: General;  Laterality: Right;   MASS EXCISION Right 07/20/2020   Procedure: EXCISION OF RIGHT CHEST WALL MASS;  Surgeon: Stark Klein, MD;  Location:  [facility];  Service: General;  Laterality: Right;   PACEMAKER IMPLANT N/A 12/04/2018   Procedure: PACEMAKER IMPLANT;  Surgeon: Evans Lance, MD;  Location: [facility];  Service: Cardiovascular;  Laterality: N/A;   Social History:  reports that he quit smoking about 59 years ago. His smoking use included cigarettes. He has never used smokeless tobacco. He reports that he does not drink alcohol and does not use drugs.  Allergies  Allergen Reactions   Sulfa Antibiotics Other (See Comments)    Weakness Dizziness  Sweats    Mysoline [Primidone] Other (See Comments)    Sedation    Zocor  [Simvastatin] Other (See Comments)    Arthralgias Fatigue    Family History  Problem Relation Age of Onset   GI problems Mother    Other Sister        PAIN ISSUES   Hearing loss Sister    Blindness Sister    Heart attack Neg Hx    Stroke Neg Hx     Prior to Admission medications   Medication Sig Start Date End Date Taking? Authorizing Provider  acetaminophen (TYLENOL) 325 MG tablet Take 2 tablets (650 mg total) by mouth every 4 (four) hours as needed for mild pain (or temp > 37.5 C (99.5 F)). 02/14/21   Angiulli, Lavon Paganini, PA-C  apixaban (ELIQUIS) 2.5 MG TABS tablet Take 1 tablet (2.5 mg total) by mouth 2 (two) times daily. 09/24/22   Dunn, Nedra Hai, PA-C  Ascorbic Acid (VITAMIN C) 1000 MG tablet Take 1,000 mg by mouth daily.    [provider]  DULoxetine (CYMBALTA) 60 MG capsule Take 1 capsule (60 mg total) by mouth daily. 04/23/21   Lovorn, Jinny Blossom, MD  erythromycin ophthalmic ointment SMARTSIG:In Eye(s) 09/05/22   [provider]  furosemide (LASIX) 20 MG tablet Take 2 tablets (40 mg total) by mouth daily. You amy take 1 additional tablet daily as needed for swelling if top bp number over 105 09/23/22   Dunn, Nedra Hai, PA-C  levothyroxine (SYNTHROID) 25 MCG tablet Take 1 tablet (25 mcg total) by mouth daily before breakfast. 02/14/21   Angiulli, Lavon Paganini, PA-C  loratadine (CLARITIN) 10 MG tablet Take 10 mg by mouth daily.    [provider]  magnesium hydroxide (MILK OF MAGNESIA) 400 MG/5ML suspension Take 30 mLs by mouth daily as needed for mild constipation.    [provider]  Multiple Vitamin (MULTIVITAMIN WITH MINERALS) TABS tablet Take 1 tablet by mouth daily.    [provider]  Multiple Vitamins-Minerals (OCUVITE ADULT 50+) CAPS Take 1 capsule by mouth daily.    [provider]  nitrofurantoin, macrocrystal-monohydrate, (MACROBID) 100 MG capsule Take 100 mg by mouth daily. Continuous course.    [provider]  polyethylene  glycol (MIRALAX / GLYCOLAX) 17 g packet Take 17 g by mouth daily. Patient taking differently: Take 17 g by mouth daily as needed for mild constipation. 07/08/21   Mariel Aloe, MD  predniSONE (DELTASONE) 5 MG tablet Take 5 mg by mouth daily. 09/12/22   [provider]  pregabalin (LYRICA) 50 MG capsule Take 50 mg by mouth at bedtime.    [provider]  primidone (MYSOLINE) 250 MG tablet Take 1 tablet (250 mg total) by mouth 2 (two) times daily. 02/14/21   Angiulli, Lavon Paganini, PA-C  tobramycin-dexamethasone [facility]) ophthalmic solution Place into the right eye. 09/07/22   [provider]  topiramate (TOPAMAX) 50 MG tablet Take 1 tablet (50  mg total) by mouth 2 (two) times daily. 02/27/22   Ward Givens, NP  vitamin B-12 (CYANOCOBALAMIN) 1000 MCG tablet Take 1 tablet (1,000 mcg total) by mouth every other day. Patient taking differently: Take 1,000 mcg by mouth See admin instructions. 1032mg oral daily on Sunday, Tuesday, Wednesday, Thursday, repeat. 02/14/21   ACathlyn Parsons PA-C    Physical Exam: Vitals:   10/13/22 1408 10/13/22 1415 10/13/22 1423 10/13/22 1515  BP: (!) 99/46 (!) 99/52  (!) 111/57  Pulse: 70 71  69  Resp: '18 18  16  '$ Temp:      TempSrc:      SpO2: 100% 100%  97%  Weight:   83 kg   Height:   '5\' 9"'$  (1.753 m)    Exam  Constitutional: Elderly male who appears to be in no acute distress Eyes: abnormal right eye.  left eye pupil reactive to light without acute abnormality ENMT: Mucous membranes are moist.  Hard of hearing despite hearing aids in place bilaterally. Neck: normal, supple Respiratory: clear to auscultation bilaterally, no wheezing, no crackles.   Cardiovascular: Regular rate and rhythm No extremity edema. 2+ pedal pulses. No carotid bruits.  Abdomen: no tenderness, no masses palpated.   Bowel sounds positive.  Musculoskeletal:  No joint deformity upper and lower extremities Skin: Bruising noted on the bilateral upper  extremities Neurologic: CN 2-12 grossly intact.  Tremor present Psychiatric:  Alert and oriented times person and place, but not time  Data Reviewed:  EKG revealed paced rhythm at 70 bpm.  Labs, imaging, and pertinent records as noted above in HPI.  Assessment and Plan: Sepsis secondary to complicated UTI Patient reportedly fevers up to 102 F prior to arrival. WBC elevated at 29.3 with initial lactic acid elevated 2.4.  Urinalysis was positive for moderate leukocytes, few bacteria, greater than 50 RBCs, greater than 50 WBCs.  Patient with prior history of frequent UTIs and has a chronic indwelling catheter is due to be changed out on 10/18. -Admit to a  telemetry bed -Follow-up blood and urine cultures -Continue meropenem per pharmacy -Hold nitrofurantoin -Trend lactic acid levels -Patient likely needs to have coud Foley exchanged as the bag was only thing exchanged while in the ED -ID to see in a.m. discussed with Dr.Manandhar  Acute kidney injury superimposed on chronic kidney disease 3B Labs revealed creatinine 2.77 with BUN 47.  Baseline creatinine previously had been 1.5 in 10/2.  Has been given 1 L IV fluids. -Continue IV fluids at 75 mL/h -Hold nephrotoxic agents -Continue to monitor kidney function daily  Nausea and vomiting Patient has had at least 2 episodes vomiting since earlier this morning.  Question possibility of aspiration. -Aspiration precautions with elevation head of the bed  Hypotension Initial blood pressure 99/46, but reported to have systolic blood pressures as low as 58  at urgent care.  Blood improved after initial IV fluids.  Recently had Lasix dose decreased on 9/25. -Goal MAP greater than 65 -Held Lasix  Pancytopenia  chronic.  Hemoglobin 11.8 with platelet count 80 which appears moderate to priors.  -Continue to monitor  Diastolic congestive heart failure Patient appears to be euvolemic at this time.  Chest x-ray noted cardiomegaly without signs  of pulmonary edema. Last EF noted 60 to be 50-55% with indeterminate diastolic parameters. -Strict I&O's and daily weights -Nephrotoxic  Persistent atrial fibrillation on chronic anticoagulation  tachybradycardia syndrome s/p pacemaker -Continue Eliquis  Hypothyroidism TSH last noted to be 4.78 on 9/25. -Continue levothyroxine  Chronic pain syndrome -Continue Cymbalta  AAA  5.1 cm ascending thoracic aortic aneurysm noted last on CT on 08/29/2022. -Continue outpatient monitoring  Deconditioning -PT to evaluate   DVT prophylaxis: Eliquis Advance Care Planning:   Code Status: DNR  Consults: ID  Family Communication: Family updated at bedside  Severity of Illness: The appropriate patient status for this patient is INPATIENT. Inpatient status is judged to be reasonable and necessary in order to provide the required intensity of service to ensure the patient's safety. The patient's presenting symptoms, physical exam findings, and initial radiographic and laboratory data in the context of their chronic comorbidities is felt to place them at high risk for further clinical deterioration. Furthermore, it is not anticipated that the patient will be medically stable for discharge from the hospital within 2 midnights of admission.   * I certify that at the point of admission it is my clinical judgment that the patient will require inpatient hospital care spanning beyond 2 midnights from the point of admission due to high intensity of service, high risk for further deterioration and high frequency of surveillance required.*  Author: Norval Morton, MD 10/13/2022 3:52 PM  For on call review [email or web address].

## 2022-10-13 NOTE — Progress Notes (Signed)
Elink is monitoring this code sepsis 

## 2022-10-14 ENCOUNTER — Inpatient Hospital Stay (HOSPITAL_COMMUNITY): Payer: Medicare Other

## 2022-10-14 DIAGNOSIS — D649 Anemia, unspecified: Secondary | ICD-10-CM

## 2022-10-14 DIAGNOSIS — D696 Thrombocytopenia, unspecified: Secondary | ICD-10-CM

## 2022-10-14 DIAGNOSIS — N179 Acute kidney failure, unspecified: Secondary | ICD-10-CM | POA: Diagnosis not present

## 2022-10-14 DIAGNOSIS — R6520 Severe sepsis without septic shock: Secondary | ICD-10-CM | POA: Diagnosis not present

## 2022-10-14 DIAGNOSIS — N390 Urinary tract infection, site not specified: Secondary | ICD-10-CM | POA: Diagnosis not present

## 2022-10-14 DIAGNOSIS — A419 Sepsis, unspecified organism: Secondary | ICD-10-CM | POA: Diagnosis not present

## 2022-10-14 DIAGNOSIS — I9589 Other hypotension: Secondary | ICD-10-CM | POA: Diagnosis not present

## 2022-10-14 LAB — URINALYSIS, ROUTINE W REFLEX MICROSCOPIC
Bilirubin Urine: NEGATIVE
Glucose, UA: NEGATIVE mg/dL
Ketones, ur: NEGATIVE mg/dL
Nitrite: NEGATIVE
Protein, ur: 100 mg/dL — AB
RBC / HPF: >50 RBC/hpf — ABNORMAL HIGH (ref 0–5)
Specific Gravity, Urine: 1.012 (ref 1.005–1.030)
WBC, UA: >50 WBC/hpf — ABNORMAL HIGH (ref 0–5)
pH: 5 (ref 5.0–8.0)

## 2022-10-14 LAB — BASIC METABOLIC PANEL
Anion gap: 12 (ref 5–15)
BUN: 45 mg/dL — ABNORMAL HIGH (ref 8–23)
CO2: 21 mmol/L — ABNORMAL LOW (ref 22–32)
Calcium: 8.6 mg/dL — ABNORMAL LOW (ref 8.9–10.3)
Chloride: 104 mmol/L (ref 98–111)
Creatinine, Ser: 2.28 mg/dL — ABNORMAL HIGH (ref 0.61–1.24)
GFR, Estimated: 26 mL/min — ABNORMAL LOW (ref >60)
Glucose, Bld: 94 mg/dL (ref 70–99)
Potassium: 3.9 mmol/L (ref 3.5–5.1)
Sodium: 137 mmol/L (ref 135–145)

## 2022-10-14 LAB — CBC
HCT: 30 % — ABNORMAL LOW (ref 39.0–52.0)
Hemoglobin: 10.2 g/dL — ABNORMAL LOW (ref 13.0–17.0)
MCH: 33 pg (ref 26.0–34.0)
MCHC: 34 g/dL (ref 30.0–36.0)
MCV: 97.1 fL (ref 80.0–100.0)
Platelets: 50 10*3/uL — ABNORMAL LOW (ref 150–400)
RBC: 3.09 MIL/uL — ABNORMAL LOW (ref 4.22–5.81)
RDW: 16.7 % — ABNORMAL HIGH (ref 11.5–15.5)
WBC: 17.7 10*3/uL — ABNORMAL HIGH (ref 4.0–10.5)
nRBC: 0 % (ref 0.0–0.2)

## 2022-10-14 LAB — URINE CULTURE

## 2022-10-14 MED ORDER — MIDODRINE HCL 5 MG PO TABS
2.5000 mg | ORAL_TABLET | Freq: Three times a day (TID) | ORAL | Status: DC
  Administered 2022-10-14 – 2022-10-21 (×21): 2.5 mg via ORAL
  Filled 2022-10-14 (×21): qty 1

## 2022-10-14 MED ORDER — LACTATED RINGERS IV SOLN: INTRAVENOUS | Status: AC

## 2022-10-14 MED ORDER — IOHEXOL 9 MG/ML PO SOLN
500.0000 mL | ORAL | Status: AC
  Administered 2022-10-14: 500 mL via ORAL

## 2022-10-14 NOTE — Consult Note (Addendum)
I have seen and examined the patient. I have personally reviewed the clinical findings, laboratory findings, microbiological data and imaging studies. The assessment and treatment plan was discussed with the Nurse Practitioner Mauricio Po. I agree with her/his recommendations except following additions/corrections.  Exam - Elderly male lying in the bed, appears confused but follows some commands. Possibly has dementia.             Mucosa moist              Heart and lung sounds WNL              Abdomen - soft, suprapubic tenderness, non distended, BS+, foleys +              Extremities no pedal edema   Labs/micro/imaging reviewed  A/P Sepsis of unclear source likely urinary, less likely aspiration given unremarkable chest Xray  Recurrent UTI in the setting of BPH and chronic foley's and prior bladder ca on macrobid prophylaxis Sinus node dysfunction s/p PPM H/o ESBL E coli colonization   Agree with plan as noted below. CT abdomen/pelvis wo contrast ordered  to r/o occult abscesses, pyelonephritis given significantly elevated leukocytosis/sepsis, unlikely due to UTI alone.  Following   Rosiland Oz, MD Infectious Disease Physician [facility] [address] [place], [place] [address] Phone: [phone]  Fax: [facility]    Date of Admission:  10/13/2022     Total days of antibiotics 1               Reason for Consult: Complicated UTI   Referring Provider: Dr. Cyndia Skeeters Primary Care Provider: Corliss Blacker, MD   ASSESSMENT:  Douglas Walker is a 88 y/o male with LUTS/BPH with chronic indwelling catheter complicated by recurrent UTI with multidrug resistant organisms on Macrobid for prophylaxis admitted with fever, vomiting, and signs of sepsis. Blood and urine cultures are pending. Due for foley exchange on 10/18 and agree given current signs of infection will need to  changed. Agree with meropenem while awaiting culture results in the setting of previous multidrug resistant organisms.  Acute renal injury appears to be slowly improving with fluids and leukocytosis improved as well. No other overt signs of infection at present. Remaining medical and supportive care per primary team.   PLAN:  Continue meropenem  Monitor cultures for organisms and adjust antibiotics accordingly.  Change catheter when able. Remaining medical and supportive care per primary team.    Principal Problem:   Sepsis ([place]) Active Problems:   Paroxysmal atrial fibrillation (HCC)   Chronic anticoagulation   Tachycardia-bradycardia syndrome (HCC)   Hypotension   Chronic diastolic CHF (congestive heart failure) (HCC)   Chronic pain syndrome   Hypothyroidism   Complicated UTI (urinary tract infection)   Acute kidney injury superimposed on chronic kidney disease (HCC)   Nausea and vomiting   Pancytopenia (HCC)   AAA (abdominal aortic aneurysm) (HCC)    apixaban  2.5 mg Oral BID   DULoxetine  60 mg Oral Daily   levothyroxine  25 mcg Oral QAC breakfast   loratadine  10 mg Oral Daily   predniSONE  5 mg Oral Daily   pregabalin  50 mg Oral QHS   sodium chloride flush  3 mL Intravenous Q12H   topiramate  50 mg Oral BID   traMADol  50 mg Oral QHS     HPI: Douglas Walker is a 88 y.o.  male wit previous medical history of LUTS in setting of BP, chronic prostatitis, sinus node dysfunction s/p pacemaker placement, chronic kidney disease stage 3, atrial fibrillation, low-grade transitional cell carcinoma of the bladder in 2005 (no recurrence), and chronic bladder drainage with foley catheter presenting wit fevers and vomiting.   Douglas Walker is known to ID service having last seen Dr. Gale Journey on 07/24/22 for follow up of chronic UTI with foley catheter being changed every 3 weeks and had just started on Macrobid for prophylaxis. No signs of infection at the time and continued on Macrobid.  Now presenting from Urgent Care with hypotension, fever and rigors. He was due for catheter change on 10/16/22. Symptoms started on 10/14 and found to have shaking chills and fever of 102.5 F and vomited shortly thereafter. Afebrile on arrival to the hospital with leukocytosis of 29.3. Urine was turbid, with moderate leukocytes, moderate hemoglobin, and >50 WBC. Initial lactic acid was 2.4 and had acute renal injury with creatinine of 2.77. Chest x-ray with no acute abnormality. Started on meropenem for complicated UTI. Blood cultures are without growth in <24 hours. Urine culture pending. Douglas Walker is unable to contribute significantly to the HPI and information is obtained from chart review.   Previous culture results:  10/27 - [facility] urine cx >100k Proteus Mirabilis (R amp/sulb, ceftriaxone, cefazolin, cipro, cefotetan, nitrofurantoin, bactrim; S ertapenem, tobra, amikacin) 09/01 - [facility] urine cx > 100k pseudomonas (pan sensitive piptazo, meropenem, cipro, ceftaz, aztreonam, gent) 07/25 - [facility] ucx >100k E. Coli ESBL  (R cipro; s amikacin, ertapenem, nitrofurantoin, gent, bactrim) 06/09 -  Citrobacter murliniae (R ceftriaxone, cefotaxim, cipro; S nitrofurantoin, gent, amikacin, nitrofurantoin) 11/2020 - E. Coli (R Cipro, S gent, amikacin, nitrofurantoin, bactrim)    Review of Systems: Review of Systems  Unable to perform ROS: Acuity of condition     Past Medical History:  Diagnosis Date   Anemia    Arthritis    shoulders and back   Cancer ([place])    skin cancers   Chronic diastolic (congestive) heart failure (HCC)    Chronic low back pain 12/15/2017   CKD (chronic kidney disease) stage 3, GFR 30-59 ml/min (HCC)    Essential tremor 02/28/2016   GERD (gastroesophageal reflux disease)    Hernia    History of hiatal hernia    Hyperlipemia    Hypertension    Macular degeneration    Neuromuscular disorder (HCC)    neuropathy   Paroxysmal atrial  fibrillation ([place]) 10/28/2013   Presence of permanent cardiac pacemaker 12/04/2018   Pulmonary hypertension ([place])    Stroke ([place]) 12/2020   Tachycardia-bradycardia syndrome ([place]) 09/13/2014   Thoracic aortic aneurysm (HCC)    Thrombocytopenia (HCC)    TIA (transient ischemic attack)    Past Surgical History:  Procedure Laterality Date   [place] / REPLACE / REMOVE PACEMAKER  12/04/2018   IRRIGATION AND DEBRIDEMENT ABSCESS Right 07/24/2020   Procedure: IRRIGATION AND DEBRIDEMENT HEMATOMA;  Surgeon: Coralie Keens, MD;  Location: [facility];  Service: General;  Laterality: Right;   MASS EXCISION Right 07/20/2020   Procedure: EXCISION OF RIGHT CHEST WALL MASS;  Surgeon: Stark Klein, MD;  Location: [facility];  Service: General;  Laterality: Right;   PACEMAKER IMPLANT N/A 12/04/2018   Procedure: PACEMAKER IMPLANT;  Surgeon: Evans Lance, MD;  Location: [facility];  Service:  Cardiovascular;  Laterality: N/A;    Social History   Tobacco Use   Smoking status: Former    Types: Cigarettes    Quit date: 12/30/1962    Years since quitting: 59.8   Smokeless tobacco: Never  Vaping Use   Vaping Use: Never used  Substance Use Topics   Alcohol use: No   Drug use: No    Family History  Problem Relation Age of Onset   GI problems Mother    Other Sister        PAIN ISSUES   Hearing loss Sister    Blindness Sister    Heart attack Neg Hx    Stroke Neg Hx     Allergies  Allergen Reactions   Sulfa Antibiotics Other (See Comments)    Weakness Dizziness  Sweats    Mysoline [Primidone] Other (See Comments)    Sedation    Zocor [Simvastatin] Other (See Comments)    Arthralgias Fatigue    OBJECTIVE: Blood pressure (!) 100/46, pulse 70, temperature 99 F (37.2 C), resp. rate (!) 24, height '5\' 9"'$  (1.753 m), weight 83 kg, SpO2 98 %.  Physical Exam Constitutional:      General: He is not in acute distress.     Appearance: He is well-developed.     Comments: Lying in bed with head of bed elevated; confused.   Cardiovascular:     Rate and Rhythm: Normal rate and regular rhythm.     Heart sounds: Normal heart sounds.  Pulmonary:     Effort: Pulmonary effort is normal.     Breath sounds: Normal breath sounds.  Skin:    General: Skin is warm and dry.  Neurological:     Mental Status: He is alert and oriented to person, place, and time.     Lab Results Lab Results  Component Value Date   WBC 17.7 (H) 10/14/2022   HGB 10.2 (L) 10/14/2022   HCT 30.0 (L) 10/14/2022   MCV 97.1 10/14/2022   PLT 50 (L) 10/14/2022    Lab Results  Component Value Date   CREATININE 2.28 (H) 10/14/2022   BUN 45 (H) 10/14/2022   NA 137 10/14/2022   K 3.9 10/14/2022   CL 104 10/14/2022   CO2 21 (L) 10/14/2022    Lab Results  Component Value Date   ALT 24 10/13/2022   AST 24 10/13/2022   ALKPHOS 69 10/13/2022   BILITOT 0.8 10/13/2022     Microbiology: Recent Results (from the past 240 hour(s))  Blood Culture (routine x 2)     Status: None (Preliminary result)   Collection Time: 10/13/22  1:56 PM   Specimen: BLOOD  Result Value Ref Range Status   Specimen Description BLOOD LEFT ANTECUBITAL  Final   Special Requests   Final    BOTTLES DRAWN AEROBIC AND ANAEROBIC Blood Culture adequate volume   Culture   Final    NO GROWTH < 24 HOURS Performed at [facility], [address] [place], [place] [address]    Report Status PENDING  Incomplete  Blood Culture (routine x 2)     Status: None (Preliminary result)   Collection Time: 10/13/22  2:35 PM   Specimen: BLOOD  Result Value Ref Range Status   Specimen Description BLOOD LEFT  Final   Special Requests   Final    BOTTLES DRAWN AEROBIC ONLY Blood Culture results may not be optimal due to an inadequate volume of blood received in culture bottles   Culture  Final    NO GROWTH < 24 HOURS Performed at [facility], [place] [address]  [place], [place] [address]    Report Status PENDING  Incomplete   Imaging  DG Chest Port 1 View  Result Date: 10/13/2022 CLINICAL DATA:  Sepsis EXAM: PORTABLE CHEST 1 VIEW COMPARISON:  08/29/2022 FINDINGS: Cardiomegaly with left chest multi lead pacer. Both lungs are clear. The visualized skeletal structures are unremarkable. IMPRESSION: Cardiomegaly without acute abnormality of the lungs in AP portable projection. Electronically Signed   By: Delanna Ahmadi M.D.   On: 10/13/2022 14:54   B-Scan Ultrasound - OD - Right Eye  Result Date: 09/23/2022 Quality was good. Findings included choroidal detachment, extensive retinal detachment, vitreous hemorrhage. Notes Total retinal detachment secondary to spontaneous expulsion of hemorrhagic choroidal detachments,  B-Scan Ultrasound - OD - Right Eye  Result Date: 09/16/2022 Quality was good. Findings included choroidal detachment, vitreous hemorrhage. Notes Total hemorrhagic choroidal detachment, and hemorrhage    Terri Piedra, NP [facility]  10/14/2022  8:47 AM

## 2022-10-14 NOTE — Plan of Care (Signed)
  Problem: Activity: Goal: Risk for activity intolerance will decrease Outcome: Progressing   Problem: Safety: Goal: Ability to remain free from injury will improve Outcome: Progressing   

## 2022-10-14 NOTE — Progress Notes (Signed)
PROGRESS NOTE  Douglas Walker UXL:[id number] DOB: 1925-12-24   PCP: Corliss Blacker, MD  Patient is from: Home.  Lives with son and daughter.  Uses rolling walker at baseline.  DOA: 10/13/2022 LOS: 1  Chief complaints Chief Complaint  Patient presents with   Emesis   Fatigue     Brief Narrative / Interim history: 88 year old M with PMH of diastolic CHF, A-fib on Eliquis, SSS/PPM, CVA, CKD-3B, anemia, AAA, chronic Foley, OA on chronic prednisone and right eye vision loss presenting with acute fever, vomiting and rigors and admitted for severe sepsis due to complicated UTI in the setting of chronic Foley catheter, AKI and hypotension.  Cultures obtained.  Patient was started on IV meropenem.  Infectious disease consulted.  Subjective: Seen and examined earlier this morning.  No major events overnight of this morning.  No complaints.  Feels thirsty and asking for water.  He denies pain, shortness of breath, cough, nausea, vomiting or abdominal pain  Objective: Vitals:   10/14/22 0715 10/14/22 0716 10/14/22 1015 10/14/22 1130  BP: (!) 100/46  (!) 102/50   Pulse: 71 70 69   Resp: (!) 22 (!) 24 18   Temp:    98.9 F (37.2 C)  TempSrc:    Oral  SpO2: 96% 98% 96%   Weight:      Height:        Examination:  GENERAL: No apparent distress.  Nontoxic. HEENT: MMM.  Missing right eye. NECK: Supple.  No apparent JVD.  RESP:  No IWOB.  Fair aeration bilaterally. CVS:  RRR. Heart sounds normal.  ABD/GI/GU: BS+. Abd soft, NTND.  Indwelling Foley. MSK/EXT:  Moves extremities. No apparent deformity. No edema.  SKIN: no apparent skin lesion or wound NEURO: Awake.  Oriented to self and place but not time.  Follows commands.  No apparent focal neuro deficit. PSYCH: Calm. Normal affect.   Procedures:  None  Microbiology summarized: Blood cultures NGTD. Urine culture with multiple species.  Assessment and plan: Principal Problem:   Severe sepsis ([place]) Active Problems:    Complicated UTI (urinary tract infection)   Acute kidney injury superimposed on chronic kidney disease (HCC)   Nausea and vomiting   Hypotension   Normocytic anemia   Chronic diastolic CHF (congestive heart failure) (HCC)   Paroxysmal atrial fibrillation (HCC)   Chronic anticoagulation   SSS (sick sinus syndrome) (HCC)   Hypothyroidism   Chronic pain syndrome   Thrombocytopenia (HCC)   Malignant neoplasm of anterior wall of urinary bladder (HCC)   AAA (abdominal aortic aneurysm) (HCC)  Severe sepsis due to complicated UTI/catheter associated UTI and patient with chronic Foley catheter: POA.  Had fever, leukocytosis, hypotension, lactic acidosis and AKI on presentation.  UA concerning for UTI but obtained from chronic Foley.  Initial urine culture with multiple species.  Blood culture NGTD.  Sepsis physiology improving.  History of ESBL on 07/02/2021. -Appreciate input by ID -Continue IV meropenem -Exchange Foley and sending urine samples for urinalysis and urine culture   AKI on CKD-3B: Likely prerenal in the setting of above.  He is also on Lasix at home.  Improving. Recent Labs    08/09/22 2055 08/10/22 0453 08/11/22 0222 08/29/22 1545 09/23/22 1411 09/30/22 1128 10/13/22 1356 10/14/22 0500  BUN '22 20 16 '$ 25* 34 27 47* 45*  CREATININE 1.29* 1.27* 1.18 1.45* 1.82* 1.57* 2.77* 2.28*  -Continue gentle IV fluid -Avoid nephrotoxic meds -Check renal ultrasound   Nausea and vomiting: Likely due to UTI.  Seems  to have resolved. -Antiemetics as needed -Depression precaution   Hypotension: SBP remains low in 50s.  Patient is on low-dose prednisone.  At risk for adrenal insufficiency. -Continue holding home Lasix -Continue gentle IV fluid -May consider stress dose steroid or midodrine if no improvement.   Normocytic anemia/thrombocytopenia: Relatively stable. Recent Labs    08/09/22 2055 08/10/22 0453 08/11/22 0222 08/29/22 1545 08/29/22 1553 09/23/22 1411 10/13/22 1356  10/14/22 0500  HGB 11.2* 11.2* 10.5* 10.7* 10.9* 11.8* 11.8* 10.2*  -Monitor H&H  Chronic diastolic CHF: Appears euvolemic on exam now.  TTE in 12/2020 with LVEF of 50 to 55%, no RWMA, mod LAE and RAE, and moderately elevated PASP.  Lasix 40 mg daily at home.  No cardiopulmonary symptoms. -Continue holding Lasix in the setting of sepsis -Strict intake and output with daily weight   Persistent A-fib/SSS: Has pacemaker for SSS.  On Eliquis for A-fib.  Not on rate or rhythm control meds. -Continue Eliquis.  We may hold this if thrombocytopenia worsens.   Hypothyroidism: TSH last noted to be 4.78 on 9/25. -Continue levothyroxine   Chronic pain syndrome -Continue Cymbalta   AAA: 5.1 cm ascending thoracic aortic aneurysm noted last on CT on 08/29/2022. -Continue outpatient monitoring  Osteoarthritis -Continue home prednisone.   Deconditioning -PT/OT eval.  Goal of care: DNR/DNI.  Appropriate.  Body mass index is 27.02 kg/m.           DVT prophylaxis:  apixaban (ELIQUIS) tablet 2.5 mg Start: 10/13/22 2200 SCDs Start: 10/13/22 1604 apixaban (ELIQUIS) tablet 2.5 mg  Code Status: DNR/DNI Family Communication: Updated patient's son and daughter over the phone. Level of care: Telemetry Medical Status is: Inpatient Remains inpatient appropriate because: Severe sepsis due to complicated UTI, AKI   Final disposition: TBD Consultants:  Infectious disease  Sch Meds:  Scheduled Meds:  apixaban  2.5 mg Oral BID   DULoxetine  60 mg Oral Daily   levothyroxine  25 mcg Oral QAC breakfast   loratadine  10 mg Oral Daily   predniSONE  5 mg Oral Daily   pregabalin  50 mg Oral QHS   sodium chloride flush  3 mL Intravenous Q12H   topiramate  50 mg Oral BID   traMADol  50 mg Oral QHS   Continuous Infusions:  meropenem (MERREM) IV Stopped (10/14/22 0715)   PRN Meds:.acetaminophen **OR** acetaminophen, albuterol, erythromycin, magnesium hydroxide, polyethylene glycol,  trimethobenzamide  Antimicrobials: Anti-infectives (From admission, onward)    Start     Dose/Rate Route Frequency Ordered Stop   10/14/22 0500  meropenem (MERREM) 500 mg in sodium chloride 0.9 % 100 mL IVPB        500 mg 200 mL/hr over 30 Minutes Intravenous Every 12 hours 10/13/22 1625     10/13/22 1500  meropenem (MERREM) 1 g in sodium chloride 0.9 % 100 mL IVPB        1 g 200 mL/hr over 30 Minutes Intravenous  Once 10/13/22 1448 10/13/22 1613        I have personally reviewed the following labs and images: CBC: Recent Labs  Lab 10/13/22 1356 10/14/22 0500  WBC 29.3* 17.7*  NEUTROABS 23.8*  --   HGB 11.8* 10.2*  HCT 35.7* 30.0*  MCV 98.6 97.1  PLT 80* 50*   BMP &GFR Recent Labs  Lab 10/13/22 1356 10/14/22 0500  NA 135 137  K 3.9 3.9  CL 101 104  CO2 21* 21*  GLUCOSE 110* 94  BUN 47* 45*  CREATININE 2.77* 2.28*  CALCIUM 8.8* 8.6*   Estimated Creatinine Clearance: 19.4 mL/min (A) (by C-G formula based on SCr of 2.28 mg/dL (H)). Liver & Pancreas: Recent Labs  Lab 10/13/22 1356  AST 24  ALT 24  ALKPHOS 69  BILITOT 0.8  PROT 6.7  ALBUMIN 3.3*   No results for input(s): "LIPASE", "AMYLASE" in the last 168 hours. No results for input(s): "AMMONIA" in the last 168 hours. Diabetic: No results for input(s): "HGBA1C" in the last 72 hours. No results for input(s): "GLUCAP" in the last 168 hours. Cardiac Enzymes: No results for input(s): "CKTOTAL", "CKMB", "CKMBINDEX", "TROPONINI" in the last 168 hours. No results for input(s): "PROBNP" in the last 8760 hours. Coagulation Profile: Recent Labs  Lab 10/13/22 1356  INR 1.1   Thyroid Function Tests: No results for input(s): "TSH", "T4TOTAL", "FREET4", "T3FREE", "THYROIDAB" in the last 72 hours. Lipid Profile: No results for input(s): "CHOL", "HDL", "LDLCALC", "TRIG", "CHOLHDL", "LDLDIRECT" in the last 72 hours. Anemia Panel: No results for input(s): "VITAMINB12", "FOLATE", "FERRITIN", "TIBC", "IRON",  "RETICCTPCT" in the last 72 hours. Urine analysis:    Component Value Date/Time   COLORURINE AMBER (A) 10/13/2022 1410   APPEARANCEUR TURBID (A) 10/13/2022 1410   LABSPEC 1.020 10/13/2022 1410   PHURINE 7.0 10/13/2022 1410   GLUCOSEU NEGATIVE 10/13/2022 1410   HGBUR MODERATE (A) 10/13/2022 1410   BILIRUBINUR NEGATIVE 10/13/2022 1410   KETONESUR NEGATIVE 10/13/2022 1410   PROTEINUR 100 (A) 10/13/2022 1410   UROBILINOGEN 0.2 06/18/2015 0441   NITRITE NEGATIVE 10/13/2022 1410   LEUKOCYTESUR MODERATE (A) 10/13/2022 1410   Sepsis Labs: Invalid input(s): "PROCALCITONIN", "LACTICIDVEN"  Microbiology: Recent Results (from the past 240 hour(s))  Blood Culture (routine x 2)     Status: None (Preliminary result)   Collection Time: 10/13/22  1:56 PM   Specimen: BLOOD  Result Value Ref Range Status   Specimen Description BLOOD LEFT ANTECUBITAL  Final   Special Requests   Final    BOTTLES DRAWN AEROBIC AND ANAEROBIC Blood Culture adequate volume   Culture   Final    NO GROWTH < 24 HOURS Performed at [facility], [place] [address] [place], [place] [address]    Report Status PENDING  Incomplete  Urine Culture     Status: Abnormal   Collection Time: 10/13/22  2:30 PM   Specimen: In/Out Cath Urine  Result Value Ref Range Status   Specimen Description IN/OUT CATH URINE  Final   Special Requests   Final    NONE Performed at [facility], [place] [address] [place], [place] [address]    Culture MULTIPLE SPECIES PRESENT, SUGGEST RECOLLECTION (A)  Final   Report Status 10/14/2022 FINAL  Final  Blood Culture (routine x 2)     Status: None (Preliminary result)   Collection Time: 10/13/22  2:35 PM   Specimen: BLOOD  Result Value Ref Range Status   Specimen Description BLOOD LEFT  Final   Special Requests   Final    BOTTLES DRAWN AEROBIC ONLY Blood Culture results may not be optimal due to an inadequate volume of blood received in culture bottles   Culture   Final    NO GROWTH < 24  HOURS Performed at [facility], [place] [address] [place], [place] [address]    Report Status PENDING  Incomplete    Radiology Studies: DG Chest Port 1 View  Result Date: 10/13/2022 CLINICAL DATA:  Sepsis EXAM: PORTABLE CHEST 1 VIEW COMPARISON:  08/29/2022 FINDINGS: Cardiomegaly with left chest  multi lead pacer. Both lungs are clear. The visualized skeletal structures are unremarkable. IMPRESSION: Cardiomegaly without acute abnormality of the lungs in AP portable projection. Electronically Signed   By: Delanna Ahmadi M.D.   On: 10/13/2022 14:54      Arianny Pun T. Richards  If 7PM-7AM, please contact night-coverage [email or web address] 10/14/2022, 12:41 PM

## 2022-10-15 DIAGNOSIS — N179 Acute kidney failure, unspecified: Secondary | ICD-10-CM | POA: Diagnosis not present

## 2022-10-15 DIAGNOSIS — N390 Urinary tract infection, site not specified: Secondary | ICD-10-CM | POA: Diagnosis not present

## 2022-10-15 DIAGNOSIS — I9589 Other hypotension: Secondary | ICD-10-CM | POA: Diagnosis not present

## 2022-10-15 DIAGNOSIS — R310 Gross hematuria: Secondary | ICD-10-CM

## 2022-10-15 DIAGNOSIS — A419 Sepsis, unspecified organism: Secondary | ICD-10-CM | POA: Diagnosis not present

## 2022-10-15 LAB — COMPREHENSIVE METABOLIC PANEL
ALT: 24 U/L (ref 0–44)
AST: 19 U/L (ref 15–41)
Albumin: 2.7 g/dL — ABNORMAL LOW (ref 3.5–5.0)
Alkaline Phosphatase: 75 U/L (ref 38–126)
Anion gap: 8 (ref 5–15)
BUN: 44 mg/dL — ABNORMAL HIGH (ref 8–23)
CO2: 25 mmol/L (ref 22–32)
Calcium: 8.8 mg/dL — ABNORMAL LOW (ref 8.9–10.3)
Chloride: 102 mmol/L (ref 98–111)
Creatinine, Ser: 1.99 mg/dL — ABNORMAL HIGH (ref 0.61–1.24)
GFR, Estimated: 30 mL/min — ABNORMAL LOW (ref >60)
Glucose, Bld: 111 mg/dL — ABNORMAL HIGH (ref 70–99)
Potassium: 3.9 mmol/L (ref 3.5–5.1)
Sodium: 135 mmol/L (ref 135–145)
Total Bilirubin: 0.8 mg/dL (ref 0.3–1.2)
Total Protein: 5.8 g/dL — ABNORMAL LOW (ref 6.5–8.1)

## 2022-10-15 LAB — CBC
HCT: 29.3 % — ABNORMAL LOW (ref 39.0–52.0)
Hemoglobin: 10.1 g/dL — ABNORMAL LOW (ref 13.0–17.0)
MCH: 32.8 pg (ref 26.0–34.0)
MCHC: 34.5 g/dL (ref 30.0–36.0)
MCV: 95.1 fL (ref 80.0–100.0)
Platelets: 65 10*3/uL — ABNORMAL LOW (ref 150–400)
RBC: 3.08 MIL/uL — ABNORMAL LOW (ref 4.22–5.81)
RDW: 16.5 % — ABNORMAL HIGH (ref 11.5–15.5)
WBC: 12 10*3/uL — ABNORMAL HIGH (ref 4.0–10.5)
nRBC: 0 % (ref 0.0–0.2)

## 2022-10-15 LAB — MAGNESIUM: Magnesium: 2.2 mg/dL (ref 1.7–2.4)

## 2022-10-15 LAB — PHOSPHORUS: Phosphorus: 2.9 mg/dL (ref 2.5–4.6)

## 2022-10-15 MED ORDER — PANTOPRAZOLE SODIUM 40 MG PO TBEC
40.0000 mg | DELAYED_RELEASE_TABLET | Freq: Every day | ORAL | Status: DC
  Administered 2022-10-15 – 2022-10-22 (×8): 40 mg via ORAL
  Filled 2022-10-15 (×9): qty 1

## 2022-10-15 NOTE — Evaluation (Signed)
Occupational Therapy Evaluation Patient Details Name: Douglas Walker MRN: [id number] DOB: 11/03/1925 Today's Date: 10/15/2022   History of Present Illness Pt is a 7y M who presented 10/15 with acute fever, vomiting, rigors. Admitted with severe sepsis d/t complicated UTI in setting of chronic foley catheter. PMHx significant for A-fib, peripheral neuropathy, essential tremor, HTN, CKD stage IIIb, and Hx of falls.   Clinical Impression   Pt presents to OT following UTI and resultant sepsis, with deficits in dynamic balance and endurance, as well as cognition. After speaking with his son to confirm PLOF he endorses pt is more confused than his baseline and slightly weaker but was using the RW and mod I overall PTA. Pt would benefit from continued acute OT services to facilitate safe d/c home and optimize occupational performance. Recommend HHOT to return to maximal independence.       Recommendations for follow up therapy are one component of a multi-disciplinary discharge planning process, led by the attending physician.  Recommendations may be updated based on patient status, additional functional criteria and insurance authorization.   Follow Up Recommendations  Home health OT    Assistance Recommended at Discharge Frequent or constant Supervision/Assistance  Patient can return home with the following A little help with walking and/or transfers;A little help with bathing/dressing/bathroom;Direct supervision/assist for medications management;Direct supervision/assist for financial management    Functional Status Assessment  Patient has had a recent decline in their functional status and demonstrates the ability to make significant improvements in function in a reasonable and predictable amount of time.  Equipment Recommendations  None recommended by OT    Recommendations for Other Services       Precautions / Restrictions Precautions Precautions: Fall Precaution Comments: Very  HOH      Mobility Bed Mobility Overal bed mobility: Needs Assistance Bed Mobility: Supine to Sit     Supine to sit: Min guard     General bed mobility comments: Min guard for bed mobility with bed rail    Transfers Overall transfer level: Needs assistance Equipment used: Rolling walker (2 wheels) Transfers: Sit to/from Stand, Bed to chair/wheelchair/BSC Sit to Stand: Min guard Stand pivot transfers: Min guard         General transfer comment: with RW      Balance Overall balance assessment: Needs assistance, History of Falls Sitting-balance support: Feet supported Sitting balance-Leahy Scale: Good Sitting balance - Comments: Able to sustain posterior lean when taking medications   Standing balance support: During functional activity, Bilateral upper extremity supported Standing balance-Leahy Scale: Poor                             ADL either performed or assessed with clinical judgement   ADL Overall ADL's : Needs assistance/impaired Eating/Feeding: Minimal assistance Eating/Feeding Details (indicate cue type and reason): Does best with finger foods Grooming: Minimal assistance;Wash/dry hands;Oral care;Wash/dry face   Upper Body Bathing: Supervision/ safety;Sitting   Lower Body Bathing: Minimal assistance;Sit to/from stand   Upper Body Dressing : Supervision/safety;Sitting   Lower Body Dressing: Minimal assistance;Sit to/from stand   Toilet Transfer: Min guard;BSC/3in1;Rolling walker (2 wheels)   Toileting- Clothing Manipulation and Hygiene: Minimal assistance;Sit to/from stand       Functional mobility during ADLs: Min guard;Rolling walker (2 wheels) General ADL Comments: Required assist for hand placement 2/2 essential tremors on the RW     Vision Baseline Vision/History: 2 Legally blind Ability to See in Adequate Light: 3 Highly  impaired Patient Visual Report:  (R eye has atrophied, son reports L eye is severely impaired) Vision  Assessment?:  (Baseline)     Perception     Praxis      Pertinent Vitals/Pain Pain Assessment Pain Assessment: No/denies pain     Hand Dominance Right   Extremity/Trunk Assessment Upper Extremity Assessment Upper Extremity Assessment: Generalized weakness   Lower Extremity Assessment Lower Extremity Assessment: Defer to PT evaluation   Cervical / Trunk Assessment Cervical / Trunk Assessment: Normal   Communication Communication Communication: HOH   Cognition Arousal/Alertness: Awake/alert Behavior During Therapy: WFL for tasks assessed/performed Overall Cognitive Status: Impaired/Different from baseline Area of Impairment: Orientation, Memory, Attention, Following commands, Safety/judgement, Awareness, Problem solving                 Orientation Level: Time Current Attention Level: Selective Memory: Decreased short-term memory Following Commands: Follows multi-step commands inconsistently       General Comments: Pt's son reports his confusion is worse     General Comments  Spoke with his son to determine home set up and PLOF. He reports he is close to baseline with increased confusion    Exercises     Shoulder Instructions      Home Living Family/patient expects to be discharged to:: Private residence Living Arrangements: Children Available Help at Discharge: Available 24 hours/day Type of Home: House Home Access: Level entry     Home Layout: One level     Bathroom Shower/Tub: Occupational psychologist: Handicapped height     Home Equipment: Conservation officer, nature (2 wheels);Shower seat          Prior Functioning/Environment Prior Level of Function : Independent/Modified Independent;History of Falls (last six months)             Mobility Comments: Used RW at [employer] I ADLs Comments: mod I        OT Problem List: Pain;Impaired balance (sitting and/or standing);Impaired vision/perception;Decreased safety awareness;Decreased  cognition;Decreased knowledge of use of DME or AE;Decreased knowledge of precautions;Decreased coordination      OT Treatment/Interventions: Self-care/ADL training;Balance training;Patient/family education;Cognitive remediation/compensation;Therapeutic activities;Energy conservation;Neuromuscular education;Therapeutic exercise;DME and/or AE instruction    OT Goals(Current goals can be found in the care plan section) Acute Rehab OT Goals Patient Stated Goal: Get home OT Goal Formulation: With patient/family Time For Goal Achievement: 10/31/22 Potential to Achieve Goals: Good  OT Frequency: Min 3X/week    Co-evaluation              AM-PAC OT "6 Clicks" Daily Activity     Outcome Measure Help from another person eating meals?: A Little Help from another person taking care of personal grooming?: A Little Help from another person toileting, which includes using toliet, bedpan, or urinal?: A Little Help from another person bathing (including washing, rinsing, drying)?: A Little Help from another person to put on and taking off regular upper body clothing?: A Little Help from another person to put on and taking off regular lower body clothing?: A Little 6 Click Score: 18   End of Session Equipment Utilized During Treatment: Gait belt;Rolling walker (2 wheels) Nurse Communication: Mobility status  Activity Tolerance: Patient tolerated treatment well Patient left: in chair;with call bell/phone within reach;with chair alarm set  OT Visit Diagnosis: Repeated falls (R29.6);Muscle weakness (generalized) (M62.81)                Time: 8127-5170 OT Time Calculation (min): 47 min Charges:  OT General Charges $OT Visit: 1 Visit  OT Evaluation $OT Eval Low Complexity: 1 Low OT Treatments $Self Care/Home Management : 23-37 mins  Laverle Hobby, OTR/L, CBIS Acute Rehab Office: [phone]   Curtis Sites 10/15/2022, 9:40 AM

## 2022-10-15 NOTE — Progress Notes (Signed)
PROGRESS NOTE  PERLEY ARTHURS PYP:[id number] DOB: 11/03/1925   PCP: Corliss Blacker, MD  Patient is from: Home.  Lives with son and daughter.  Uses rolling walker at baseline.  DOA: 10/13/2022 LOS: 2  Chief complaints Chief Complaint  Patient presents with   Emesis   Fatigue     Brief Narrative / Interim history: 88 year old M with PMH of diastolic CHF, A-fib on Eliquis, SSS/PPM, CVA, CKD-3B, anemia, AAA, chronic Foley, OA on chronic prednisone and right eye vision loss presenting with acute fever, vomiting and rigors and admitted for severe sepsis due to complicated UTI in the setting of chronic Foley catheter, AKI and hypotension.  Cultures obtained.  Patient was started on IV meropenem.  Infectious disease consulted.  The next day, Foley catheter exchanged.  CT abdomen suggests UTI, trace bilateral effusion, reflux and degenerative disc disease.  Foley catheter exchanged.  Repeat UA and urine culture sent.  Subjective: Seen and examined earlier this morning.  No major events overnight of this morning.  Sitting on bedside commode.  Has no complaints other than some pain in his bladder.  Denies chest pain, dyspnea, cough or GI symptoms.  Objective: Vitals:   10/14/22 1722 10/14/22 2112 10/15/22 0626 10/15/22 0841  BP: 107/60 106/80  (!) 150/76  Pulse: 70 70  70  Resp: '18 20  18  '$ Temp: 99.1 F (37.3 C) 99 F (37.2 C)  99.1 F (37.3 C)  TempSrc: Oral Oral  Oral  SpO2: 100% 98%  92%  Weight: 88 kg  81.2 kg   Height:        Examination:  GENERAL: No apparent distress.  Nontoxic. HEENT: MMM.  Missing right eye. NECK: Supple.  No apparent JVD.  RESP:  No IWOB.  Fair aeration bilaterally. CVS:  RRR. Heart sounds normal.  ABD/GI/GU: BS+. Abd soft, NTND.  Foley catheter in place.  Light red urine without blood clot. MSK/EXT:  Moves extremities. No apparent deformity. No edema.  SKIN: no apparent skin lesion or wound NEURO: Awake and alert. Oriented appropriately.   No apparent focal neuro deficit other than facial tremor PSYCH: Calm. Normal affect.   Procedures:  None  Microbiology summarized: Blood cultures NGTD. Urine culture with multiple species. Repeat urine culture pending  Assessment and plan: Principal Problem:   Severe sepsis ([place]) Active Problems:   Complicated UTI (urinary tract infection)   Acute kidney injury superimposed on chronic kidney disease (HCC)   Nausea and vomiting   Hypotension   Normocytic anemia   Chronic diastolic CHF (congestive heart failure) (HCC)   Paroxysmal atrial fibrillation (HCC)   Chronic anticoagulation   SSS (sick sinus syndrome) (HCC)   Hypothyroidism   Chronic pain syndrome   Thrombocytopenia (HCC)   Chronic indwelling Foley catheter   AAA (abdominal aortic aneurysm) ([place])  Severe sepsis due to complicated UTI/catheter associated UTI and patient with chronic Foley catheter: POA.  Had fever, leukocytosis, hypotension, lactic acidosis and AKI on admission.  UA concerning for UTI but obtained from chronic Foley.  Initial urine culture with multiple species.  Blood culture NGTD.  CT abdomen and pelvis suggests cystitis.  Foley catheter exchanged on 10/16.  Sepsis physiology improving.  History of ESBL on 07/02/2021. -Appreciate input by ID -Continue IV meropenem -Follow repeat urine culture   AKI on CKD-3B: Likely prerenal in the setting of above.  He is also on Lasix at home.  No obstruction on imaging.  Improving. Recent Labs    08/09/22 2055 08/10/22 0453 08/11/22  0222 08/29/22 1545 09/23/22 1411 09/30/22 1128 10/13/22 1356 10/14/22 0500 10/15/22 0317  BUN '22 20 16 '$ 25* 34 27 47* 45* 44*  CREATININE 1.29* 1.27* 1.18 1.45* 1.82* 1.57* 2.77* 2.28* 1.99*  -Continue gentle IV fluid -Avoid nephrotoxic meds   Nausea and vomiting: Likely due to UTI.  Resolved. -Antiemetics as needed   Hypotension: Normotensive. -Continue holding home Lasix -Continue IV fluid and low-dose midodrine    Normocytic anemia/thrombocytopenia/gross hematuria: Relatively stable. Recent Labs    08/09/22 2055 08/10/22 0453 08/11/22 0222 08/29/22 1545 08/29/22 1553 09/23/22 1411 10/13/22 1356 10/14/22 0500 10/15/22 0317  HGB 11.2* 11.2* 10.5* 10.7* 10.9* 11.8* 11.8* 10.2* 10.1*  -Monitor H&H -Hold Eliquis.  Chronic diastolic CHF: Appears euvolemic on exam now.  TTE in 12/2020 with LVEF of 50 to 55%, no RWMA, mod LAE and RAE, and moderately elevated PASP.  Lasix 40 mg daily at home.  No cardiopulmonary symptoms. -Continue holding Lasix in the setting of sepsis -Strict intake and output with daily weight   Persistent A-fib/SSS: Has pacemaker for SSS.  On Eliquis for A-fib.  Not on rate or rhythm control meds. -Eliquis on hold due to gross hematuria after Foley exchange.   Hypothyroidism: TSH last noted to be 4.78 on 9/25. -Continue levothyroxine   Chronic pain syndrome -Continue Cymbalta   AAA: 5.1 cm ascending thoracic aortic aneurysm noted last on CT on 08/29/2022. -Continue outpatient monitoring  Osteoarthritis -Continue home prednisone.   Deconditioning -PT/OT eval.  Goal of care: DNR/DNI.  Appropriate.  Body mass index is 26.44 kg/m.           DVT prophylaxis:  Place and maintain sequential compression device Start: 10/14/22 1458 SCDs Start: 10/13/22 1604  Code Status: DNR/DNI Family Communication: None at bedside. Level of care: Telemetry Medical Status is: Inpatient Remains inpatient appropriate because: Severe sepsis due to complicated UTI, AKI   Final disposition: Home with home health once medically stable Consultants:  Infectious disease  Sch Meds:  Scheduled Meds:  DULoxetine  60 mg Oral Daily   levothyroxine  25 mcg Oral QAC breakfast   loratadine  10 mg Oral Daily   midodrine  2.5 mg Oral TID WC   pantoprazole  40 mg Oral Daily   predniSONE  5 mg Oral Daily   pregabalin  50 mg Oral QHS   sodium chloride flush  3 mL Intravenous Q12H    topiramate  50 mg Oral BID   traMADol  50 mg Oral QHS   Continuous Infusions:  meropenem (MERREM) IV 500 mg (10/15/22 0644)   PRN Meds:.acetaminophen **OR** acetaminophen, albuterol, erythromycin, magnesium hydroxide, polyethylene glycol, trimethobenzamide  Antimicrobials: Anti-infectives (From admission, onward)    Start     Dose/Rate Route Frequency Ordered Stop   10/14/22 0500  meropenem (MERREM) 500 mg in sodium chloride 0.9 % 100 mL IVPB        500 mg 200 mL/hr over 30 Minutes Intravenous Every 12 hours 10/13/22 1625     10/13/22 1500  meropenem (MERREM) 1 g in sodium chloride 0.9 % 100 mL IVPB        1 g 200 mL/hr over 30 Minutes Intravenous  Once 10/13/22 1448 10/13/22 1613        I have personally reviewed the following labs and images: CBC: Recent Labs  Lab 10/13/22 1356 10/14/22 0500 10/15/22 0317  WBC 29.3* 17.7* 12.0*  NEUTROABS 23.8*  --   --   HGB 11.8* 10.2* 10.1*  HCT 35.7* 30.0* 29.3*  MCV 98.6  97.1 95.1  PLT 80* 50* 65*   BMP &GFR Recent Labs  Lab 10/13/22 1356 10/14/22 0500 10/15/22 0317  NA 135 137 135  K 3.9 3.9 3.9  CL 101 104 102  CO2 21* 21* 25  GLUCOSE 110* 94 111*  BUN 47* 45* 44*  CREATININE 2.77* 2.28* 1.99*  CALCIUM 8.8* 8.6* 8.8*  MG  --   --  2.2  PHOS  --   --  2.9   Estimated Creatinine Clearance: 22.2 mL/min (A) (by C-G formula based on SCr of 1.99 mg/dL (H)). Liver & Pancreas: Recent Labs  Lab 10/13/22 1356 10/15/22 0317  AST 24 19  ALT 24 24  ALKPHOS 69 75  BILITOT 0.8 0.8  PROT 6.7 5.8*  ALBUMIN 3.3* 2.7*   No results for input(s): "LIPASE", "AMYLASE" in the last 168 hours. No results for input(s): "AMMONIA" in the last 168 hours. Diabetic: No results for input(s): "HGBA1C" in the last 72 hours. No results for input(s): "GLUCAP" in the last 168 hours. Cardiac Enzymes: No results for input(s): "CKTOTAL", "CKMB", "CKMBINDEX", "TROPONINI" in the last 168 hours. No results for input(s): "PROBNP" in the last  8760 hours. Coagulation Profile: Recent Labs  Lab 10/13/22 1356  INR 1.1   Thyroid Function Tests: No results for input(s): "TSH", "T4TOTAL", "FREET4", "T3FREE", "THYROIDAB" in the last 72 hours. Lipid Profile: No results for input(s): "CHOL", "HDL", "LDLCALC", "TRIG", "CHOLHDL", "LDLDIRECT" in the last 72 hours. Anemia Panel: No results for input(s): "VITAMINB12", "FOLATE", "FERRITIN", "TIBC", "IRON", "RETICCTPCT" in the last 72 hours. Urine analysis:    Component Value Date/Time   COLORURINE AMBER (A) 10/14/2022 1628   APPEARANCEUR CLOUDY (A) 10/14/2022 1628   LABSPEC 1.012 10/14/2022 1628   PHURINE 5.0 10/14/2022 1628   GLUCOSEU NEGATIVE 10/14/2022 1628   HGBUR LARGE (A) 10/14/2022 1628   BILIRUBINUR NEGATIVE 10/14/2022 1628   KETONESUR NEGATIVE 10/14/2022 1628   PROTEINUR 100 (A) 10/14/2022 1628   UROBILINOGEN 0.2 06/18/2015 0441   NITRITE NEGATIVE 10/14/2022 1628   LEUKOCYTESUR LARGE (A) 10/14/2022 1628   Sepsis Labs: Invalid input(s): "PROCALCITONIN", "LACTICIDVEN"  Microbiology: Recent Results (from the past 240 hour(s))  Blood Culture (routine x 2)     Status: None (Preliminary result)   Collection Time: 10/13/22  1:56 PM   Specimen: BLOOD  Result Value Ref Range Status   Specimen Description BLOOD LEFT ANTECUBITAL  Final   Special Requests   Final    BOTTLES DRAWN AEROBIC AND ANAEROBIC Blood Culture adequate volume   Culture   Final    NO GROWTH 2 DAYS Performed at [facility], [address], [place], [place] [address]    Report Status PENDING  Incomplete  Urine Culture     Status: Abnormal   Collection Time: 10/13/22  2:30 PM   Specimen: In/Out Cath Urine  Result Value Ref Range Status   Specimen Description IN/OUT CATH URINE  Final   Special Requests   Final    NONE Performed at [facility], [place] [address], [place], [place] [address]    Culture MULTIPLE SPECIES PRESENT, SUGGEST RECOLLECTION (A)  Final   Report Status 10/14/2022 FINAL   Final  Blood Culture (routine x 2)     Status: None (Preliminary result)   Collection Time: 10/13/22  2:35 PM   Specimen: BLOOD  Result Value Ref Range Status   Specimen Description BLOOD LEFT  Final   Special Requests   Final    BOTTLES DRAWN AEROBIC ONLY Blood  Culture results may not be optimal due to an inadequate volume of blood received in culture bottles   Culture   Final    NO GROWTH 2 DAYS Performed at [facility], [place] [address], [place], [place] [address]    Report Status PENDING  Incomplete    Radiology Studies: CT ABDOMEN PELVIS WO CONTRAST  Result Date: 10/14/2022 CLINICAL DATA:  Sepsis, abscess. History of recurrent urinary tract infection and pyelonephritis. Hypotension. Chronic indwelling catheter. EXAM: CT ABDOMEN AND PELVIS WITHOUT CONTRAST TECHNIQUE: Multidetector CT imaging of the abdomen and pelvis was performed following the standard protocol without IV contrast. RADIATION DOSE REDUCTION: This exam was performed according to the departmental dose-optimization program which includes automated exposure control, adjustment of the mA and/or kV according to patient size and/or use of iterative reconstruction technique. COMPARISON:  08/29/2022 FINDINGS: Lower chest: Trace bilateral pleural effusions with increase in dependent atelectasis in both lower lobes. Stable mild nodularity in the left lower lobe including a 4 mm left lower lobe nodule on image 26 series 5. Descending thoracic aortic and coronary atherosclerosis noted with pacer leads observed. Hepatobiliary: Stable sub-centimeter hypodense hepatic lesions, probably cysts although technically nonspecific on today's noncontrast CT. Gallbladder unremarkable. Old granulomatous disease involving the liver noted. Pancreas: Unremarkable Spleen: Unremarkable Adrenals/Urinary Tract: Both adrenal glands appear normal. Fluid density renal lesions favor cysts on today's noncontrast exam. These cystic lesions do not require  independent workup. No hydronephrosis or hydroureter. Calcifications along the renal hila are vascular in nature. Empty urinary bladder noted with a Foley catheter in the urinary bladder. There is some luminal gas in the urinary bladder particularly along what is thought to likely be anterior bladder diverticula, these are less likely to be small abscesses along the anterior margin of the urinary bladder but the lack of distention of the urinary bladder makes this a difficult assessment. There is some mild stranding in the adipose tissues anterior to the urinary bladder suggesting inflammation. Stomach/Bowel: Contrast medium in the distal esophagus suggest reflux or dysmotility. Borderline prominence of stool in the rectal vault. Notable sigmoid colon diverticulosis. No definite active diverticulitis. Vascular/Lymphatic: Atherosclerosis is present, including aortoiliac atherosclerotic disease. There is some atherosclerotic calcification in the superior mesenteric artery and in both renal arteries. No pathologic adenopathy is observed. Reproductive: Dense calcifications in the prostate gland. Other: Trace fluid in the right paracolic gutter. Musculoskeletal: Trace subcutaneous and mesenteric edema diffusely. Lumbar spondylosis and degenerative disc disease with resulting foraminal impingement bilaterally at L3-4 and L4-5, and on the left at L5-S1. IMPRESSION: 1. Foley catheter in the urinary bladder with luminal gas in the urinary bladder particularly within suspected anterior bladder diverticula, these are less likely to be small abscesses along the anterior margin of the urinary bladder but the lack of distention of the urinary bladder makes this a difficult differentiation. There is some mild stranding in the adipose tissues anterior to the urinary bladder suggesting inflammation/cystitis. 2. Trace bilateral pleural effusions with increase in dependent atelectasis in both lower lobes. 3. Contrast medium in the  distal esophagus suggest reflux or dysmotility. 4. Lumbar spondylosis and degenerative disc disease causing foraminal impingement bilaterally at L3-4 and L4-5 and on the left at L5-S1. 5. Aortic atherosclerosis. Aortic Atherosclerosis (ICD10-I70.0). Electronically Signed   By: Van Clines M.D.   On: 10/14/2022 17:08      Arieliz Latino T. [place]  If 7PM-7AM, please contact night-coverage [email or web address] 10/15/2022, 3:40 PM

## 2022-10-15 NOTE — TOC Initial Note (Signed)
Transition of Care [facility]) - Initial/Assessment Note    Patient Details  Name: Douglas Walker MRN: [id number] Date of Birth: 1925-02-10  Transition of Care [facility]) CM/SW Contact:    Ninfa Meeker, RN Phone Number: 10/15/2022, 9:33 AM  Clinical Narrative: Transition of Care Screening Note  Transition of Care Department [facility]) has reviewed patient and no TOC needs have been identified at this time. We will continue to monitor patient advancement through Interdisciplinary progressions. If new patient transition needs arise, please place a consult.                        Patient Goals and CMS Choice        Expected Discharge Plan and Services                                                Prior Living Arrangements/Services                       Activities of Daily Living Home Assistive Devices/Equipment: Other (Comment) ADL Screening (condition at time of admission) Patient's cognitive ability adequate to safely complete daily activities?: Yes Is the patient deaf or have difficulty hearing?: No Does the patient have difficulty seeing, even when wearing glasses/contacts?: Yes Does the patient have difficulty concentrating, remembering, or making decisions?: No Patient able to express need for assistance with ADLs?: Yes Does the patient have difficulty dressing or bathing?: Yes Independently performs ADLs?: No Does the patient have difficulty walking or climbing stairs?: Yes Weakness of Legs: Both Weakness of Arms/Hands: None  Permission Sought/Granted                  Emotional Assessment              Admission diagnosis:  Thrombocytopenia ([place]) [D69.6] AKI (acute kidney injury) ([place]) [N17.9] Sepsis secondary to UTI ([place]) [A41.9, N39.0] Sepsis ([place]) [A41.9] Catheter cystitis, initial encounter ([place]) [T83.518A, N30.90] Vomiting, unspecified vomiting type, unspecified whether nausea present [R11.10] Patient Active Problem List    Diagnosis Date Noted   Severe sepsis ([place]) 10/13/2022   Nausea and vomiting 10/13/2022   Normocytic anemia 10/13/2022   AAA (abdominal aortic aneurysm) ([place]) 10/13/2022   Physical deconditioning 08/29/2022   Eye pain, right 08/10/2022   Blind painful right eye 08/08/2022   Vitreous hemorrhage of right eye ([place]) 08/05/2022   Choroidal hemorrhage, right 08/05/2022   Secondary glaucoma due to combination mechanisms, right, moderate stage 08/05/2022   History of corneal transplant 05/16/2022   Recurrent UTI 01/31/2022   Chronic indwelling Foley catheter 01/31/2022   Benign prostatic hyperplasia with lower urinary tract symptoms 01/31/2022   Medication monitoring encounter 01/31/2022   Multiple drug resistant organism (MDRO) culture positive 01/31/2022   Bacteremia due to Proteus species 10/11/2021   Acute kidney injury superimposed on chronic kidney disease ([place]) 16/06/3709   Complicated UTI (urinary tract infection) 07/02/2021   Pain in both testicles    Right hemiparesis ([place]) 04/23/2021   Cerebrovascular accident (CVA) due to embolism of left middle cerebral artery ([place]) 04/23/2021   Neuropathy 04/23/2021   Abnormality of gait 04/23/2021   Abnormal results of thyroid function studies 04/04/2021   Acquired iron deficiency anemia due to decreased absorption 04/04/2021   Acquired thrombophilia ([place]) 04/04/2021   Cerebral atherosclerosis 04/04/2021   Basal cell carcinoma  of nose 04/04/2021   Benign hypertensive heart and renal disease, with heart and renal failure ([place]) 04/04/2021   Benign prostatic hyperplasia 04/04/2021   Obesity 04/04/2021   Bradycardia 04/04/2021   Closed fracture of one rib 04/04/2021   Constipation by delayed colonic transit 04/04/2021   Fatigue 04/04/2021   Gastro-esophageal reflux disease without esophagitis 04/04/2021   Hypothyroidism 04/04/2021   Left lower quadrant pain 04/04/2021   Lumbar radiculopathy 04/04/2021   Reactive depression 04/04/2021    Mass of chest wall 04/04/2021   Mixed hyperlipidemia 04/04/2021   Nocturia 04/04/2021   Other shoulder lesions, right shoulder 04/04/2021   Pain in limb 04/04/2021   Peripheral edema 04/04/2021   Personal history of malignant neoplasm of bladder 04/04/2021   Pruritus of genitalia 04/04/2021   Tremor 04/04/2021   Unilateral primary osteoarthritis, left knee 04/04/2021   Unspecified mononeuropathy of unspecified lower limb 04/04/2021   Urinary incontinence 04/04/2021   Dysphagia, post-stroke    Chronic diastolic congestive heart failure (HCC)    Chronic pain syndrome    Left middle cerebral artery stroke ([place]) 02/01/2021   Acute CVA (cerebrovascular accident) ([place]) 01/28/2021   Pacemaker 08/30/2020   Hematoma, chest wall 07/24/2020   Exudative age-related macular degeneration of right eye with inactive choroidal neovascularization ([place]) 05/10/2020   Exudative age-related macular degeneration of left eye with inactive choroidal neovascularization ([place]) 05/10/2020   Retinal hemorrhage of right eye 05/10/2020   Exposure keratopathy, bilateral 05/10/2020   Bilateral dry eyes 05/10/2020   Advanced nonexudative age-related macular degeneration of both eyes with subfoveal involvement 05/10/2020   Complete heart block ([place]) 12/04/2018   Chronic low back pain 12/15/2017   Hereditary and idiopathic peripheral neuropathy 07/01/2016   Essential tremor 02/28/2016   BPPV (benign paroxysmal positional vertigo) 06/19/2015   Dizziness 06/18/2015   Bacteria in urine 06/18/2015   Stage 3a chronic kidney disease (CKD) ([place]) 06/18/2015   Chronic diastolic CHF (congestive heart failure) ([place]) 05/30/2015   Fall    Syncope 05/19/2015   Lower urinary tract infectious disease 05/19/2015   Hypotension 05/19/2015   Dehydration 05/19/2015   Fracture of rib of left side 05/19/2015   Syncope and collapse 05/19/2015   Mobitz type II atrioventricular block 09/13/2014   Essential hypertension 09/13/2014    Chronic anticoagulation 09/13/2014   SSS (sick sinus syndrome) ([place]) 09/13/2014   Paroxysmal atrial fibrillation ([place]) 10/28/2013   Thrombocytopenia ([place]) 04/06/2013   PCP:  Corliss Blacker, MD Pharmacy:   [place], [place] [facility] [address] [place] [address] Phone: [phone] Fax: [phone]     Social Determinants of Health (SDOH) Interventions    Readmission Risk Interventions    10/15/2021   10:43 AM 07/26/2020   10:05 AM  Readmission Risk Prevention Plan  Transportation Screening Complete Complete  PCP or Specialist Appt within 5-7 Days  Not Complete  Not Complete comments  pending disposition  Home Care Screening  Complete  Medication Review (RN CM)  Referral to Pharmacy  Medication Review (RN Care Manager) Complete   PCP or Specialist appointment within 3-5 days of discharge Complete   HRI or Home Care Consult Complete   SW Recovery Care/Counseling Consult Complete   Palliative Care Screening Not Applicable   Skilled Nursing Facility Complete

## 2022-10-15 NOTE — Evaluation (Signed)
Physical Therapy Evaluation Patient Details Name: Douglas Walker MRN: [id number] DOB: 09-03-1925 Today's Date: 10/15/2022  History of Present Illness  Pt is a 33y M who presented 10/15 with acute fever, vomiting, rigors. Admitted with severe sepsis d/t complicated UTI in setting of chronic foley catheter. PMHx significant for A-fib, peripheral neuropathy, essential tremor, HTN, CKD stage IIIb, and Hx of falls.  Clinical Impression   Pt admitted secondary to problem above with deficits below. PTA patient was livng with son with 24/7 care. He was walking with RW modified independently.  Pt currently requires min assist for maneuvering RW in tight spaces (especially got confused and tangled up when trying to use BSC). Patient appears to have necessary assist to return home safely, primarily limited by cognition.  Anticipate patient will benefit from PT to address problems listed below.Will continue to follow acutely to maximize functional mobility independence and safety.          Recommendations for follow up therapy are one component of a multi-disciplinary discharge planning process, led by the attending physician.  Recommendations may be updated based on patient status, additional functional criteria and insurance authorization.  Follow Up Recommendations Home health PT      Assistance Recommended at Discharge Frequent or constant Supervision/Assistance  Patient can return home with the following  A little help with walking and/or transfers;Assistance with cooking/housework;Direct supervision/assist for medications management;Direct supervision/assist for financial management;Assist for transportation    Equipment Recommendations None recommended by PT  Recommendations for Other Services       Functional Status Assessment Patient has had a recent decline in their functional status and demonstrates the ability to make significant improvements in function in a reasonable and  predictable amount of time.     Precautions / Restrictions Precautions Precautions: Fall Precaution Comments: Very HOH      Mobility  Bed Mobility               General bed mobility comments: up in recliner on arrival    Transfers Overall transfer level: Needs assistance Equipment used: Rolling walker (2 wheels) Transfers: Sit to/from Stand, Bed to chair/wheelchair/BSC Sit to Stand: Min guard   Step pivot transfers: Min assist       General transfer comment: initial straight transfer recliner to [facility] with Rw without issue; as returnring from walk again wanted [facility] and had difficulty aligning himself and RW in front of BSC (catching RW on legs of BSC, then trying to park walker aside and reach around to armrest of BSC--required assist maneuvering RW)    Ambulation/Gait Ambulation/Gait assistance: Min guard Gait Distance (Feet): 40 Feet Assistive device: Rolling walker (2 wheels) Gait Pattern/deviations: Step-through pattern, Decreased stride length, Shuffle   Gait velocity interpretation: 1.31 - 2.62 ft/sec, indicative of limited community ambulator   General Gait Details: able to maneuver RW in turns in room (wide area); +UE tremors but no overt LOB  Stairs            Wheelchair Mobility    Modified Rankin (Stroke Patients Only)       Balance Overall balance assessment: Needs assistance, History of Falls Sitting-balance support: Feet supported Sitting balance-Leahy Scale: Good Sitting balance - Comments: Able to sustain posterior lean when taking medications   Standing balance support: During functional activity, Bilateral upper extremity supported Standing balance-Leahy Scale: Poor  Pertinent Vitals/Pain Pain Assessment Pain Assessment: No/denies pain    Home Living Family/patient expects to be discharged to:: Private residence Living Arrangements: Children Available Help at Discharge: Available 24  hours/day Type of Home: House Home Access: Level entry       Home Layout: One level Home Equipment: Conservation officer, nature (2 wheels);Shower seat;Grab bars - tub/shower      Prior Function Prior Level of Function : Independent/Modified Independent;History of Falls (last six months)             Mobility Comments: Used RW at [employer] I ADLs Comments: mod I     Hand Dominance   Dominant Hand: Right    Extremity/Trunk Assessment   Upper Extremity Assessment Upper Extremity Assessment: Generalized weakness (tremors)    Lower Extremity Assessment Lower Extremity Assessment: Generalized weakness    Cervical / Trunk Assessment Cervical / Trunk Assessment: Normal  Communication   Communication: HOH  Cognition Arousal/Alertness: Awake/alert Behavior During Therapy: WFL for tasks assessed/performed Overall Cognitive Status: Impaired/Different from baseline Area of Impairment: Orientation, Attention, Following commands, Safety/judgement, Awareness, Problem solving                 Orientation Level: Time Current Attention Level: Selective   Following Commands: Follows multi-step commands inconsistently                General Comments General comments (skin integrity, edema, etc.): Assisted on/off BSC x 2 during session. total assist for pericare s/p BM    Exercises     Assessment/Plan    PT Assessment Patient needs continued PT services  PT Problem List Decreased strength;Decreased balance;Decreased mobility;Decreased cognition;Decreased knowledge of use of DME;Decreased safety awareness;Impaired sensation       PT Treatment Interventions DME instruction;Gait training;Functional mobility training;Therapeutic activities;Therapeutic exercise;Balance training;Cognitive remediation;Patient/family education    PT Goals (Current goals can be found in the Care Plan section)  Acute Rehab PT Goals Patient Stated Goal: agrees to walk; otherwise unable due to decr  cognition PT Goal Formulation: With patient Time For Goal Achievement: 10/29/22 Potential to Achieve Goals: Good    Frequency Min 3X/week     Co-evaluation               AM-PAC PT "6 Clicks" Mobility  Outcome Measure Help needed turning from your back to your side while in a flat bed without using bedrails?: A Little Help needed moving from lying on your back to sitting on the side of a flat bed without using bedrails?: A Little Help needed moving to and from a bed to a chair (including a wheelchair)?: A Little Help needed standing up from a chair using your arms (e.g., wheelchair or bedside chair)?: A Little Help needed to walk in hospital room?: A Little Help needed climbing 3-5 steps with a railing? : A Little 6 Click Score: 18    End of Session Equipment Utilized During Treatment: Gait belt Activity Tolerance: Patient tolerated treatment well Patient left: in chair;with call bell/phone within reach;with chair alarm set Nurse Communication: Mobility status PT Visit Diagnosis: Unsteadiness on feet (R26.81);History of falling (Z91.81)    Time: 0272-5366 PT Time Calculation (min) (ACUTE ONLY): 31 min   Charges:   PT Evaluation $PT Eval Moderate Complexity: 1 Mod PT Treatments $Gait Training: 8-22 mins         Arby Barrette, PT Acute Rehabilitation Services  Office [phone]   Rexanne Mano 10/15/2022, 10:28 AM

## 2022-10-15 NOTE — Progress Notes (Signed)
Mobility Specialist Progress Note:   10/15/22 1610  Mobility  Activity Transferred to/from BSC;Ambulated with assistance in room  Level of Assistance Minimal assist, patient does 75% or more  Assistive Device Front wheel walker;BSC  Distance Ambulated (ft) 10 ft  Activity Response Tolerated well  Mobility Referral Yes  $Mobility charge 1 Mobility   Pt eager for mobility session, however limited by BM. Transferred to BSC with minA d/t impulsivity d/t BM urgency. Pt back in bed with all needs met, bed alarm on.    Acute Rehab Secure Chat or Office Phone: [phone]  

## 2022-10-16 DIAGNOSIS — A419 Sepsis, unspecified organism: Secondary | ICD-10-CM | POA: Diagnosis not present

## 2022-10-16 DIAGNOSIS — N179 Acute kidney failure, unspecified: Secondary | ICD-10-CM | POA: Diagnosis not present

## 2022-10-16 DIAGNOSIS — Z978 Presence of other specified devices: Secondary | ICD-10-CM

## 2022-10-16 DIAGNOSIS — I5032 Chronic diastolic (congestive) heart failure: Secondary | ICD-10-CM | POA: Diagnosis not present

## 2022-10-16 DIAGNOSIS — I714 Abdominal aortic aneurysm, without rupture, unspecified: Secondary | ICD-10-CM | POA: Diagnosis not present

## 2022-10-16 LAB — CBC
HCT: 30.4 % — ABNORMAL LOW (ref 39.0–52.0)
Hemoglobin: 10.6 g/dL — ABNORMAL LOW (ref 13.0–17.0)
MCH: 32.8 pg (ref 26.0–34.0)
MCHC: 34.9 g/dL (ref 30.0–36.0)
MCV: 94.1 fL (ref 80.0–100.0)
Platelets: 64 10*3/uL — ABNORMAL LOW (ref 150–400)
RBC: 3.23 MIL/uL — ABNORMAL LOW (ref 4.22–5.81)
RDW: 16.1 % — ABNORMAL HIGH (ref 11.5–15.5)
WBC: 9.7 10*3/uL (ref 4.0–10.5)
nRBC: 0 % (ref 0.0–0.2)

## 2022-10-16 LAB — URINE CULTURE: Culture: NO GROWTH

## 2022-10-16 LAB — RENAL FUNCTION PANEL
Albumin: 2.7 g/dL — ABNORMAL LOW (ref 3.5–5.0)
Anion gap: 9 (ref 5–15)
BUN: 40 mg/dL — ABNORMAL HIGH (ref 8–23)
CO2: 23 mmol/L (ref 22–32)
Calcium: 9.1 mg/dL (ref 8.9–10.3)
Chloride: 105 mmol/L (ref 98–111)
Creatinine, Ser: 1.71 mg/dL — ABNORMAL HIGH (ref 0.61–1.24)
GFR, Estimated: 36 mL/min — ABNORMAL LOW (ref >60)
Glucose, Bld: 103 mg/dL — ABNORMAL HIGH (ref 70–99)
Phosphorus: 2.6 mg/dL (ref 2.5–4.6)
Potassium: 3.7 mmol/L (ref 3.5–5.1)
Sodium: 137 mmol/L (ref 135–145)

## 2022-10-16 LAB — MAGNESIUM: Magnesium: 2.2 mg/dL (ref 1.7–2.4)

## 2022-10-16 MED ORDER — SODIUM CHLORIDE 0.9 % IV SOLN
500.0000 mg | INTRAVENOUS | Status: DC
  Administered 2022-10-17 – 2022-10-18 (×2): 500 mg via INTRAVENOUS
  Filled 2022-10-16 (×4): qty 0.5

## 2022-10-16 MED ORDER — SODIUM CHLORIDE 0.9% FLUSH
10.0000 mL | Freq: Two times a day (BID) | INTRAVENOUS | Status: DC
  Administered 2022-10-17 – 2022-10-21 (×5): 10 mL

## 2022-10-16 MED ORDER — CHLORHEXIDINE GLUCONATE CLOTH 2 % EX PADS
6.0000 | MEDICATED_PAD | Freq: Every day | CUTANEOUS | Status: DC
  Administered 2022-10-16 – 2022-10-22 (×7): 6 via TOPICAL

## 2022-10-16 MED ORDER — SODIUM CHLORIDE 0.9% FLUSH: 10.0000 mL | INTRAVENOUS | Status: DC | PRN

## 2022-10-16 NOTE — TOC Progression Note (Incomplete)
Transition of Care [facility]) - Progression Note    Patient Details  Name: Douglas Walker MRN: [id number] Date of Birth: Apr 07, 1925  [facility]) CM/SW Contact  Loletha Grayer Beverely Pace, RN Phone Number: 10/16/2022, 12:30 PM  Clinical Narrative:    Case Manager received call from patient's son,[name], who states  based on what they are seeing they are not going to be able to care for him at home. Especially since he will need PICC and antibiotics. Asking that we begin SNF process. Case manager        Expected Discharge Plan and Services                                                 Social Determinants of Health (SDOH) Interventions    Readmission Risk Interventions    10/15/2021   10:43 AM 07/26/2020   10:05 AM  Readmission Risk Prevention Plan  Transportation Screening Complete Complete  PCP or Specialist Appt within 5-7 Days  Not Complete  Not Complete comments  pending disposition  Home Care Screening  Complete  Medication Review (RN CM)  Referral to Pharmacy  Medication Review (RN Care Manager) Complete   PCP or Specialist appointment within 3-5 days of discharge Complete   HRI or Home Care Consult Complete   SW Recovery Care/Counseling Consult Complete   [place] Complete

## 2022-10-16 NOTE — Progress Notes (Signed)
PHARMACY CONSULT NOTE FOR:  OUTPATIENT  PARENTERAL ANTIBIOTIC THERAPY (OPAT)  Indication: complicated UTI with history of ESBL  Regimen: ertapenem 500 mg IV Q24H  End date: 10/22/22  IV antibiotic discharge orders are pended. To discharging provider:  please sign these orders via discharge navigator,  Select New Orders & click on the button choice - Manage This Unsigned Work.    Thank you for allowing pharmacy to be a part of this patient's care.  Adria Dill, PharmD PGY-2 Infectious Diseases Resident  10/16/2022 2:41 PM

## 2022-10-16 NOTE — Consult Note (Signed)
Urology Consult   Physician requesting consult: Dr. Aline August  Reason for consult: UTI, bladder abnormality  History of Present Illness: Douglas Walker is a 88 y.o. with a chronic indwelling catheter for urinary retention followed by Dr. Aleen Campi admitted with a UTI.  On Meropenem per ID for treatment.  CT imaging was performed.  No hydronephrosis.  Multiple bladder diverticuli noted and  air within bladder consistent with recent catheter exchange.  Pt is unable to provide additional history.  Asked by primary team to review CT scan per ID and offer any additional advice.   Past Medical History:  Diagnosis Date   Anemia    Arthritis    shoulders and back   Cancer (HCC)    skin cancers   Chronic diastolic (congestive) heart failure (HCC)    Chronic low back pain 12/15/2017   CKD (chronic kidney disease) stage 3, GFR 30-59 ml/min (HCC)    Essential tremor 02/28/2016   GERD (gastroesophageal reflux disease)    Hernia    History of hiatal hernia    Hyperlipemia    Hypertension    Macular degeneration    Neuromuscular disorder (HCC)    neuropathy   Paroxysmal atrial fibrillation ([place]) 10/28/2013   Presence of permanent cardiac pacemaker 12/04/2018   Pulmonary hypertension ([place])    Stroke ([place]) 12/2020   Tachycardia-bradycardia syndrome ([place]) 09/13/2014   Thoracic aortic aneurysm (HCC)    Thrombocytopenia (HCC)    TIA (transient ischemic attack)     Past Surgical History:  Procedure Laterality Date   [place] / REPLACE / REMOVE PACEMAKER  12/04/2018   IRRIGATION AND DEBRIDEMENT ABSCESS Right 07/24/2020   Procedure: IRRIGATION AND DEBRIDEMENT HEMATOMA;  Surgeon: Coralie Keens, MD;  Location: [place];  Service: General;  Laterality: Right;   MASS EXCISION Right 07/20/2020   Procedure: EXCISION OF RIGHT CHEST WALL MASS;  Surgeon: Stark Klein, MD;  Location: [place];  Service: General;  Laterality:  Right;   PACEMAKER IMPLANT N/A 12/04/2018   Procedure: PACEMAKER IMPLANT;  Surgeon: Evans Lance, MD;  Location: [place] [facility];  Service: Cardiovascular;  Laterality: N/A;    Current Hospital Medications:  Home Meds:  No current facility-administered medications on file prior to encounter.   Current Outpatient Medications on File Prior to Encounter  Medication Sig Dispense Refill   acetaminophen (TYLENOL) 325 MG tablet Take 2 tablets (650 mg total) by mouth every 4 (four) hours as needed for mild pain (or temp > 37.5 C (99.5 F)).     apixaban (ELIQUIS) 2.5 MG TABS tablet Take 1 tablet (2.5 mg total) by mouth 2 (two) times daily. 60 tablet 3   Ascorbic Acid (VITAMIN C) 1000 MG tablet Take 1,000 mg by mouth daily.     DULoxetine (CYMBALTA) 60 MG capsule Take 1 capsule (60 mg total) by mouth daily. 30 capsule 11   erythromycin ophthalmic ointment Place 1 Application into the right eye daily as needed (for plastic conformer).     furosemide (LASIX) 20 MG tablet Take 2 tablets (40 mg total) by mouth daily. You amy take 1 additional tablet daily as needed for swelling if top bp number over 105 60 tablet 3   levothyroxine (SYNTHROID) 25 MCG tablet Take 1 tablet (25 mcg total) by mouth daily before breakfast. 90 tablet 1   loratadine (CLARITIN) 10 MG tablet Take 10  mg by mouth daily.     magnesium hydroxide (MILK OF MAGNESIA) 400 MG/5ML suspension Take 30 mLs by mouth daily as needed for mild constipation.     Multiple Vitamin (MULTIVITAMIN WITH MINERALS) TABS tablet Take 1 tablet by mouth daily.     Multiple Vitamins-Minerals (OCUVITE ADULT 50+) CAPS Take 1 capsule by mouth daily.     nitrofurantoin, macrocrystal-monohydrate, (MACROBID) 100 MG capsule Take 100 mg by mouth daily. Continuous course.     polyethylene glycol (MIRALAX / GLYCOLAX) 17 g packet Take 17 g by mouth daily. (Patient taking differently: Take 17 g by mouth daily as needed for mild constipation.) 30 each 0   predniSONE  (DELTASONE) 5 MG tablet Take 5 mg by mouth daily.     pregabalin (LYRICA) 50 MG capsule Take 50 mg by mouth at bedtime.     topiramate (TOPAMAX) 50 MG tablet Take 1 tablet (50 mg total) by mouth 2 (two) times daily. 180 tablet 0   traMADol (ULTRAM) 50 MG tablet Take 50 mg by mouth at bedtime.     vitamin B-12 (CYANOCOBALAMIN) 1000 MCG tablet Take 1 tablet (1,000 mcg total) by mouth every other day. (Patient taking differently: Take 1,000 mcg by mouth See admin instructions. 1049mg oral daily on Sunday, Tuesday, Wednesday, Thursday, repeat.) 30 tablet 0     Scheduled Meds:  Chlorhexidine Gluconate Cloth  6 each Topical Daily   DULoxetine  60 mg Oral Daily   levothyroxine  25 mcg Oral QAC breakfast   loratadine  10 mg Oral Daily   midodrine  2.5 mg Oral TID WC   pantoprazole  40 mg Oral Daily   predniSONE  5 mg Oral Daily   pregabalin  50 mg Oral QHS   sodium chloride flush  3 mL Intravenous Q12H   topiramate  50 mg Oral BID   traMADol  50 mg Oral QHS   Continuous Infusions:  [START ON 10/17/2022] ertapenem     meropenem (MERREM) IV 500 mg (10/16/22 0443)   PRN Meds:.acetaminophen **OR** acetaminophen, albuterol, erythromycin, magnesium hydroxide, polyethylene glycol, trimethobenzamide  Allergies:  Allergies  Allergen Reactions   Sulfa Antibiotics Other (See Comments)    Weakness Dizziness  Sweats    Mysoline [Primidone] Other (See Comments)    Sedation    Zocor [Simvastatin] Other (See Comments)    Arthralgias Fatigue    Family History  Problem Relation Age of Onset   GI problems Mother    Other Sister        PAIN ISSUES   Hearing loss Sister    Blindness Sister    Heart attack Neg Hx    Stroke Neg Hx     Social History:  reports that he quit smoking about 59 years ago. His smoking use included cigarettes. He has never used smokeless tobacco. He reports that he does not drink alcohol and does not use drugs.  ROS: A complete review of systems was performed.  All  systems are negative except for pertinent findings as noted.  Physical Exam:  Vital signs in last 24 hours: Temp:  [98.2 F (36.8 C)-99.6 F (37.6 C)] 98.2 F (36.8 C) (10/18 1017) Pulse Rate:  [63-70] 70 (10/18 1017) Resp:  [16-18] 16 (10/18 0411) BP: (123-157)/(64-76) 123/65 (10/18 1017) SpO2:  [90 %-98 %] 98 % (10/18 0411) Weight:  [88.2 kg] 88.2 kg (10/18 0500) Constitutional: Resting comfortably, No acute distress Cardiovascular: No JVD Respiratory: Normal respiratory effort GI: Abdomen is soft, nontender, nondistended, no abdominal masses GU:  No CVA tenderness, urine draining well and clear Lymphatic: No lymphadenopathy Neurologic: Grossly intact, no focal deficits Psychiatric: Normal mood and affect  Laboratory Data:  Recent Labs    10/14/22 0500 10/15/22 0317 10/16/22 0232  WBC 17.7* 12.0* 9.7  HGB 10.2* 10.1* 10.6*  HCT 30.0* 29.3* 30.4*  PLT 50* 65* 64*    Recent Labs    10/14/22 0500 10/15/22 0317 10/16/22 0232  NA 137 135 137  K 3.9 3.9 3.7  CL 104 102 105  GLUCOSE 94 111* 103*  BUN 45* 44* 40*  CALCIUM 8.6* 8.8* 9.1  CREATININE 2.28* 1.99* 1.71*     Results for orders placed or performed during the hospital encounter of 10/13/22 (from the past 24 hour(s))  Magnesium     Status: None   Collection Time: 10/16/22  2:32 AM  Result Value Ref Range   Magnesium 2.2 1.7 - 2.4 mg/dL  CBC     Status: Abnormal   Collection Time: 10/16/22  2:32 AM  Result Value Ref Range   WBC 9.7 4.0 - 10.5 K/uL   RBC 3.23 (L) 4.22 - 5.81 MIL/uL   Hemoglobin 10.6 (L) 13.0 - 17.0 g/dL   HCT 30.4 (L) 39.0 - 52.0 %   MCV 94.1 80.0 - 100.0 fL   MCH 32.8 26.0 - 34.0 pg   MCHC 34.9 30.0 - 36.0 g/dL   RDW 16.1 (H) 11.5 - 15.5 %   Platelets 64 (L) 150 - 400 K/uL   nRBC 0.0 0.0 - 0.2 %  Renal function panel     Status: Abnormal   Collection Time: 10/16/22  2:32 AM  Result Value Ref Range   Sodium 137 135 - 145 mmol/L   Potassium 3.7 3.5 - 5.1 mmol/L   Chloride 105 98 -  111 mmol/L   CO2 23 22 - 32 mmol/L   Glucose, Bld 103 (H) 70 - 99 mg/dL   BUN 40 (H) 8 - 23 mg/dL   Creatinine, Ser 1.71 (H) 0.61 - 1.24 mg/dL   Calcium 9.1 8.9 - 10.3 mg/dL   Phosphorus 2.6 2.5 - 4.6 mg/dL   Albumin 2.7 (L) 3.5 - 5.0 g/dL   GFR, Estimated 36 (L) >60 mL/min   Anion gap 9 5 - 15   Recent Results (from the past 240 hour(s))  Blood Culture (routine x 2)     Status: None (Preliminary result)   Collection Time: 10/13/22  1:56 PM   Specimen: BLOOD  Result Value Ref Range Status   Specimen Description BLOOD LEFT ANTECUBITAL  Final   Special Requests   Final    BOTTLES DRAWN AEROBIC AND ANAEROBIC Blood Culture adequate volume   Culture   Final    NO GROWTH 3 DAYS Performed at [facility], [address] [place], [place] [address]    Report Status PENDING  Incomplete  Urine Culture     Status: Abnormal   Collection Time: 10/13/22  2:30 PM   Specimen: In/Out Cath Urine  Result Value Ref Range Status   Specimen Description IN/OUT CATH URINE  Final   Special Requests   Final    NONE Performed at [facility], [place] [address], [place], [place] [address]    Culture MULTIPLE SPECIES PRESENT, SUGGEST RECOLLECTION (A)  Final   Report Status 10/14/2022 FINAL  Final  Blood Culture (routine x 2)     Status: None (Preliminary result)   Collection Time: 10/13/22  2:35 PM   Specimen: BLOOD  Result Value Ref Range Status   Specimen Description BLOOD LEFT  Final   Special Requests   Final    BOTTLES DRAWN AEROBIC ONLY Blood Culture results may not be optimal due to an inadequate volume of blood received in culture bottles   Culture   Final    NO GROWTH 3 DAYS Performed at [facility] [address], [place], [place] [address]    Report Status PENDING  Incomplete  Remove and replace urinary cath (placed > 5 days) then obtain urine culture from new indwelling urinary catheter.     Status: None   Collection Time: 10/14/22 12:40 PM   Specimen: Urine,  Catheterized  Result Value Ref Range Status   Specimen Description URINE, CATHETERIZED  Final   Special Requests NONE  Final   Culture   Final    NO GROWTH Performed at [facility], [address] [place], [place] [address]    Report Status 10/16/2022 FINAL  Final    Renal Function: Recent Labs    10/13/22 1356 10/14/22 0500 10/15/22 0317 10/16/22 0232  CREATININE 2.77* 2.28* 1.99* 1.71*   Estimated Creatinine Clearance: 28.4 mL/min (A) (by C-G formula based on SCr of 1.71 mg/dL (H)).  Radiologic Imaging: CT ABDOMEN PELVIS WO CONTRAST  Result Date: 10/14/2022 CLINICAL DATA:  Sepsis, abscess. History of recurrent urinary tract infection and pyelonephritis. Hypotension. Chronic indwelling catheter. EXAM: CT ABDOMEN AND PELVIS WITHOUT CONTRAST TECHNIQUE: Multidetector CT imaging of the abdomen and pelvis was performed following the standard protocol without IV contrast. RADIATION DOSE REDUCTION: This exam was performed according to the departmental dose-optimization program which includes automated exposure control, adjustment of the mA and/or kV according to patient size and/or use of iterative reconstruction technique. COMPARISON:  08/29/2022 FINDINGS: Lower chest: Trace bilateral pleural effusions with increase in dependent atelectasis in both lower lobes. Stable mild nodularity in the left lower lobe including a 4 mm left lower lobe nodule on image 26 series 5. Descending thoracic aortic and coronary atherosclerosis noted with pacer leads observed. Hepatobiliary: Stable sub-centimeter hypodense hepatic lesions, probably cysts although technically nonspecific on today's noncontrast CT. Gallbladder unremarkable. Old granulomatous disease involving the liver noted. Pancreas: Unremarkable Spleen: Unremarkable Adrenals/Urinary Tract: Both adrenal glands appear normal. Fluid density renal lesions favor cysts on today's noncontrast exam. These cystic lesions do not require independent  workup. No hydronephrosis or hydroureter. Calcifications along the renal hila are vascular in nature. Empty urinary bladder noted with a Foley catheter in the urinary bladder. There is some luminal gas in the urinary bladder particularly along what is thought to likely be anterior bladder diverticula, these are less likely to be small abscesses along the anterior margin of the urinary bladder but the lack of distention of the urinary bladder makes this a difficult assessment. There is some mild stranding in the adipose tissues anterior to the urinary bladder suggesting inflammation. Stomach/Bowel: Contrast medium in the distal esophagus suggest reflux or dysmotility. Borderline prominence of stool in the rectal vault. Notable sigmoid colon diverticulosis. No definite active diverticulitis. Vascular/Lymphatic: Atherosclerosis is present, including aortoiliac atherosclerotic disease. There is some atherosclerotic calcification in the superior mesenteric artery and in both renal arteries. No pathologic adenopathy is observed. Reproductive: Dense calcifications in the prostate gland. Other: Trace fluid in the right paracolic gutter. Musculoskeletal: Trace subcutaneous and mesenteric edema diffusely. Lumbar spondylosis and degenerative disc disease with resulting foraminal impingement bilaterally at L3-4 and L4-5, and on the left at L5-S1. IMPRESSION: 1. Foley catheter in  the urinary bladder with luminal gas in the urinary bladder particularly within suspected anterior bladder diverticula, these are less likely to be small abscesses along the anterior margin of the urinary bladder but the lack of distention of the urinary bladder makes this a difficult differentiation. There is some mild stranding in the adipose tissues anterior to the urinary bladder suggesting inflammation/cystitis. 2. Trace bilateral pleural effusions with increase in dependent atelectasis in both lower lobes. 3. Contrast medium in the distal  esophagus suggest reflux or dysmotility. 4. Lumbar spondylosis and degenerative disc disease causing foraminal impingement bilaterally at L3-4 and L4-5 and on the left at L5-S1. 5. Aortic atherosclerosis. Aortic Atherosclerosis (ICD10-I70.0). Electronically Signed   By: Van Clines M.D.   On: 10/14/2022 17:08    I independently reviewed the above imaging studies.  Impression/Recommendation Urinary retention/UTI: Defer to ID for antibiotic recommendations.  Continue indwelling catheter.  CT findings suggest bladder diverticuli expected with history of BPH and urinary retention.  Do no suspect bladder wall abscess, etc.  No further recommendations.  Keep f/u with Dr. Lovena Neighbours in early November as scheduled.  Dutch Gray 10/16/2022, 3:19 PM    Pryor Curia MD   CC: Dr. Starla Link

## 2022-10-16 NOTE — Care Management Important Message (Signed)
Important Message  Patient Details  Name: Douglas Walker MRN: [id number] Date of Birth: 24-Mar-1925   Medicare Important Message Given:  Yes     Orbie Pyo 10/16/2022, 3:02 PM

## 2022-10-16 NOTE — Progress Notes (Addendum)
[facility] Infectious Diseases Follow Up Note  Patient Identification: Patient Name: Douglas Walker MRN: [id number] [place] Date: 10/13/2022  1:25 PM Age: 88 y.o.Today's Date: 10/16/2022  Reason for Visit: sepsis, recurrent UTI   Principal Problem:   Severe sepsis [facility]) Active Problems:   Thrombocytopenia (HCC)   Paroxysmal atrial fibrillation (HCC)   Chronic anticoagulation   SSS (sick sinus syndrome) (HCC)   Hypotension   Chronic diastolic CHF (congestive heart failure) (HCC)   Chronic pain syndrome   Hypothyroidism   Complicated UTI (urinary tract infection)   Acute kidney injury superimposed on chronic kidney disease (HCC)   Chronic indwelling Foley catheter   Nausea and vomiting   Normocytic anemia   AAA (abdominal aortic aneurysm) (HCC)   Antibiotics: Meropenem 10/15-c  Lines/Hardwares: PPM  Interval Events: remains afebrile, Leukcoytosis has resolved, lactic acidosis resolved. Hematuria after foley's change - stable  Assessment # Complicated UTI in the setting of BPH with chronic foley's with h/o recurrent UTI and ESBL infection + low grade transitional cell ca in remission, on macrobid for UTI prophylaxis  CT abdomen pelvis reviewed by Urology, possible anterior bladder diverticula and unlikely to be abscesses. Some mild stranding in the adipose tissue anterior to the urinary bladder  s/o cystitis S/p foleys removed 10/16 and replaced 10/18 Urine cx 10./16 after foley's removal NG ( final) 10/15 blood cx NG in 3 days and 10/15 urine cx with multiple organisms  Although urine cx negative still suspect urinary source for sepsis  # Thrombocytopenia - seems to be present since past with acute worsening in the setting of sepsis, monitor # CKD  Recommendations Complete 10 days of meropenem. EOT 10/22/22  OK to place [facility] for abtx   Fu in ID clinic with Dr Gale Journey arranged for recurrent UTI and consideration  for PO suppression.   It seems he has been on cephalexin suppression in the past and most recently macrobid. Do not see much choices based on the organisms he has grown in the past/resistance profile, age, CKD and sulfa allergy. Can consider fosfomycin although not strong data.   Fu with Urology as instructed   ID will sign off. Please call with questions  Rest of the management as per the primary team. Thank you for the consult. Please page with pertinent questions or concerns.  ______________________________________________________________________ Subjective patient seen and examined at the bedside.  He is confused per RN  Answers some questions  Also hard of hearing   Past Medical History:  Diagnosis Date   Anemia    Arthritis    shoulders and back   Cancer ([place])    skin cancers   Chronic diastolic (congestive) heart failure (HCC)    Chronic low back pain 12/15/2017   CKD (chronic kidney disease) stage 3, GFR 30-59 ml/min (HCC)    Essential tremor 02/28/2016   GERD (gastroesophageal reflux disease)    Hernia    History of hiatal hernia    Hyperlipemia    Hypertension    Macular degeneration    Neuromuscular disorder (HCC)    neuropathy   Paroxysmal atrial fibrillation ([place]) 10/28/2013   Presence of permanent cardiac pacemaker 12/04/2018   Pulmonary hypertension ([place])    Stroke ([place]) 12/2020   Tachycardia-bradycardia syndrome ([place]) 09/13/2014   Thoracic aortic aneurysm (HCC)    Thrombocytopenia (HCC)    TIA (transient ischemic attack)    Past Surgical History:  Procedure Laterality Date   APPENDECTOMY     EYE SURGERY     HERNIA  REPAIR     HERNIA REPAIR     INSERT / REPLACE / REMOVE PACEMAKER  12/04/2018   IRRIGATION AND DEBRIDEMENT ABSCESS Right 07/24/2020   Procedure: IRRIGATION AND DEBRIDEMENT HEMATOMA;  Surgeon: Coralie Keens, MD;  Location: [facility];  Service: General;  Laterality: Right;   MASS EXCISION Right 07/20/2020   Procedure: EXCISION OF RIGHT  CHEST WALL MASS;  Surgeon: Stark Klein, MD;  Location: [facility];  Service: General;  Laterality: Right;   PACEMAKER IMPLANT N/A 12/04/2018   Procedure: PACEMAKER IMPLANT;  Surgeon: Evans Lance, MD;  Location: [facility];  Service: Cardiovascular;  Laterality: N/A;     Vitals BP 123/65   Pulse 70   Temp 98.2 F (36.8 C) (Oral)   Resp 16   Ht '5\' 9"'$  (1.753 m)   Wt 88.2 kg   SpO2 98%   BMI 28.71 kg/m     Physical Exam Constitutional:  elderly male lying in the bed, not in acute distress    Comments: follows some commands on and off  Cardiovascular:     Rate and Rhythm: Normal rate and regular rhythm.     Heart sounds:   Pulmonary:     Effort: Pulmonary effort is normal on room air     Comments:   Abdominal:     Palpations: Abdomen is soft.     Tenderness: mild tenderness at the SP area, foleys+  Musculoskeletal:        General: No swelling or tenderness in peripheral joints   Skin:    Comments:   Neurological:     General: possible dementia and follows commands on and off.   Psychiatric:        Mood and Affect: Mood normal.   Pertinent Microbiology Results for orders placed or performed during the hospital encounter of 10/13/22  Blood Culture (routine x 2)     Status: None (Preliminary result)   Collection Time: 10/13/22  1:56 PM   Specimen: BLOOD  Result Value Ref Range Status   Specimen Description BLOOD LEFT ANTECUBITAL  Final   Special Requests   Final    BOTTLES DRAWN AEROBIC AND ANAEROBIC Blood Culture adequate volume   Culture   Final    NO GROWTH 3 DAYS Performed at [facility], [place] [address], [place], [place] [address]    Report Status PENDING  Incomplete  Urine Culture     Status: Abnormal   Collection Time: 10/13/22  2:30 PM   Specimen: In/Out Cath Urine  Result Value Ref Range Status   Specimen Description IN/OUT CATH URINE  Final   Special Requests   Final    NONE Performed at [facility], [place] [address]  [place], [place] [address]    Culture MULTIPLE SPECIES PRESENT, SUGGEST RECOLLECTION (A)  Final   Report Status 10/14/2022 FINAL  Final  Blood Culture (routine x 2)     Status: None (Preliminary result)   Collection Time: 10/13/22  2:35 PM   Specimen: BLOOD  Result Value Ref Range Status   Specimen Description BLOOD LEFT  Final   Special Requests   Final    BOTTLES DRAWN AEROBIC ONLY Blood Culture results may not be optimal due to an inadequate volume of blood received in culture bottles   Culture   Final    NO GROWTH 3 DAYS Performed at [facility], [place] [address] [place], [place] [address]    Report Status PENDING  Incomplete  Pertinent Lab.    Latest Ref Rng & Units 10/16/2022    2:32 AM 10/15/2022    3:17 AM 10/14/2022    5:00 AM  CBC  WBC 4.0 - 10.5 K/uL 9.7  12.0  17.7   Hemoglobin 13.0 - 17.0 g/dL 10.6  10.1  10.2   Hematocrit 39.0 - 52.0 % 30.4  29.3  30.0   Platelets 150 - 400 K/uL 64  65  50       Latest Ref Rng & Units 10/16/2022    2:32 AM 10/15/2022    3:17 AM 10/14/2022    5:00 AM  CMP  Glucose 70 - 99 mg/dL 103  111  94   BUN 8 - 23 mg/dL 40  44  45   Creatinine 0.61 - 1.24 mg/dL 1.71  1.99  2.28   Sodium 135 - 145 mmol/L 137  135  137   Potassium 3.5 - 5.1 mmol/L 3.7  3.9  3.9   Chloride 98 - 111 mmol/L 105  102  104   CO2 22 - 32 mmol/L '23  25  21   '$ Calcium 8.9 - 10.3 mg/dL 9.1  8.8  8.6   Total Protein 6.5 - 8.1 g/dL  5.8    Total Bilirubin 0.3 - 1.2 mg/dL  0.8    Alkaline Phos 38 - 126 U/L  75    AST 15 - 41 U/L  19    ALT 0 - 44 U/L  24       Pertinent Imaging today Plain films and CT images have been personally visualized and interpreted; radiology reports have been reviewed. Decision making incorporated into the Impression / Recommendations. CT ABDOMEN PELVIS WO CONTRAST  Result Date: 10/14/2022 CLINICAL DATA:  Sepsis, abscess. History of recurrent urinary tract infection and pyelonephritis. Hypotension. Chronic indwelling catheter.  EXAM: CT ABDOMEN AND PELVIS WITHOUT CONTRAST TECHNIQUE: Multidetector CT imaging of the abdomen and pelvis was performed following the standard protocol without IV contrast. RADIATION DOSE REDUCTION: This exam was performed according to the departmental dose-optimization program which includes automated exposure control, adjustment of the mA and/or kV according to patient size and/or use of iterative reconstruction technique. COMPARISON:  08/29/2022 FINDINGS: Lower chest: Trace bilateral pleural effusions with increase in dependent atelectasis in both lower lobes. Stable mild nodularity in the left lower lobe including a 4 mm left lower lobe nodule on image 26 series 5. Descending thoracic aortic and coronary atherosclerosis noted with pacer leads observed. Hepatobiliary: Stable sub-centimeter hypodense hepatic lesions, probably cysts although technically nonspecific on today's noncontrast CT. Gallbladder unremarkable. Old granulomatous disease involving the liver noted. Pancreas: Unremarkable Spleen: Unremarkable Adrenals/Urinary Tract: Both adrenal glands appear normal. Fluid density renal lesions favor cysts on today's noncontrast exam. These cystic lesions do not require independent workup. No hydronephrosis or hydroureter. Calcifications along the renal hila are vascular in nature. Empty urinary bladder noted with a Foley catheter in the urinary bladder. There is some luminal gas in the urinary bladder particularly along what is thought to likely be anterior bladder diverticula, these are less likely to be small abscesses along the anterior margin of the urinary bladder but the lack of distention of the urinary bladder makes this a difficult assessment. There is some mild stranding in the adipose tissues anterior to the urinary bladder suggesting inflammation. Stomach/Bowel: Contrast medium in the distal esophagus suggest reflux or dysmotility. Borderline prominence of stool in the rectal vault. Notable sigmoid  colon diverticulosis. No definite active diverticulitis. Vascular/Lymphatic: Atherosclerosis is  present, including aortoiliac atherosclerotic disease. There is some atherosclerotic calcification in the superior mesenteric artery and in both renal arteries. No pathologic adenopathy is observed. Reproductive: Dense calcifications in the prostate gland. Other: Trace fluid in the right paracolic gutter. Musculoskeletal: Trace subcutaneous and mesenteric edema diffusely. Lumbar spondylosis and degenerative disc disease with resulting foraminal impingement bilaterally at L3-4 and L4-5, and on the left at L5-S1. IMPRESSION: 1. Foley catheter in the urinary bladder with luminal gas in the urinary bladder particularly within suspected anterior bladder diverticula, these are less likely to be small abscesses along the anterior margin of the urinary bladder but the lack of distention of the urinary bladder makes this a difficult differentiation. There is some mild stranding in the adipose tissues anterior to the urinary bladder suggesting inflammation/cystitis. 2. Trace bilateral pleural effusions with increase in dependent atelectasis in both lower lobes. 3. Contrast medium in the distal esophagus suggest reflux or dysmotility. 4. Lumbar spondylosis and degenerative disc disease causing foraminal impingement bilaterally at L3-4 and L4-5 and on the left at L5-S1. 5. Aortic atherosclerosis. Aortic Atherosclerosis (ICD10-I70.0). Electronically Signed   By: Van Clines M.D.   On: 10/14/2022 17:08   US RENAL  Result Date: 10/14/2022 CLINICAL DATA:  Acute renal injury. EXAM: RENAL / URINARY TRACT ULTRASOUND COMPLETE COMPARISON:  CTA of the abdomen of 08/29/2022 FINDINGS: Right Kidney: Renal measurements: 10.0 x 4.5 x 4.8 cm = volume: 115 mL. Normal renal cortical thickness for age. 1.8 cm cyst. No hydronephrosis. Left Kidney: Renal measurements: 11.0 x 6.1 x 4.4 cm = volume: 155 mL. Normal cortical thickness for age. No  hydronephrosis. Lower pole 2.4 cm renal cyst. Bladder: Decompressed around a Foley catheter. Other: None. IMPRESSION: No acute process or explanation for acute renal insufficiency. Electronically Signed   By: Abigail Miyamoto M.D.   On: 10/14/2022 13:40     I spent 50 minutes for this patient encounter including review of prior medical records, coordination of care with primary/other specialist with greater than 50% of time being face to face/counseling and discussing diagnostics/treatment plan with the patient/family.  Electronically signed by:   Rosiland Oz, MD Infectious Disease Physician [facility] Infectious [facility] Pager: [phone]

## 2022-10-16 NOTE — Progress Notes (Signed)
PROGRESS NOTE    Douglas Walker  PXT:[id number] DOB: 1925/05/17 DOA: 10/13/2022 PCP: Corliss Blacker, MD   Brief Narrative:  88 year old M with PMH of diastolic CHF, A-fib on Eliquis, SSS/PPM, CVA, CKD-3B, anemia, AAA, chronic Foley, OA on chronic prednisone and right eye vision loss presented with fever, vomiting and admitted for severe sepsis due to complicated UTI in the setting of chronic Foley catheter, AKI and hypotension.  He was started on broad-spectrum antibiotics.  ID was consulted.  Foley catheter was exchanged the next day.   Assessment & Plan:   Severe sepsis: Present on admission Complicated UTI/catheter associated UTI in a patient with chronic Foley catheter: Present on admission -Currently on meropenem.  ID following.  Foley catheter has already been exchanged on 10/14/2022.  Repeat urine culture after Foley exchange is pending.  Initial urine culture from prior Foley grew multiple species -CT of the abdomen and pelvis concerning for?  Bladder abscess.  ID recommending urology evaluation.  I have consulted urology: I spoke to Dr. Alinda Money on phone who will see the patient in consultation -Blood cultures negative so far -Hemodynamically improving and currently afebrile  AKI on CKD stage IIIb -Creatinine 2.77 on presentation.  Much improved to 1.71 today.  Treated with IV fluids.  Repeat a.m. labs  Nausea and vomiting -Resolved  Glucose ptosis -Resolved  Anemia of chronic disease Hematuria -Hemoglobin currently stable.  Eliquis on hold.  Resume Eliquis on discharge if hemoglobin remains stable.   Diastolic CHF -Echo in 05/2702 had shown EF of 50 to 55%.  Currently compensated.  Strict input and output.  Daily weights.  Lasix on hold.  Outpatient follow-up with cardiology  Persistent A-fib/SSS status post pacemaker -Eliquis on hold as above due to gross hematuria after Foley exchange. -Currently controlled.  Outpatient follow-up with cardiology  Chronic pain  syndrome--continue Cymbalta  Hypothyroidism -Continue levothyroxine  Osteoarthritis-continue home prednisone  AAA - 5.1 cm ascending thoracic aortic aneurysm noted last on CT on 08/29/2022. -Continue outpatient monitoring  Physical deconditioning -Will need home and PT  DVT prophylaxis: SCDs Code Status: DNR Family Communication: None at bedside Disposition Plan: Status is: Inpatient Remains inpatient appropriate because: Of severity of illness    Consultants: ID/urology  Procedures: None  Antimicrobials: Meropenem from 10/13/2022 onwards   Subjective: Patient seen and examined at bedside.  Sleepy, poor historian.  No overnight fever, agitation or seizures reported.  Objective: Vitals:   10/15/22 2047 10/16/22 0411 10/16/22 0500 10/16/22 1017  BP: (!) 157/76 135/64  123/65  Pulse: 69 70  70  Resp: 18 16    Temp: 98.8 F (37.1 C) 98.9 F (37.2 C)  98.2 F (36.8 C)  TempSrc: Oral Oral  Oral  SpO2: 97% 98%    Weight:   88.2 kg   Height:        Intake/Output Summary (Last 24 hours) at 10/16/2022 1038 Last data filed at 10/16/2022 0700 Gross per 24 hour  Intake 1010 ml  Output 700 ml  Net 310 ml   Filed Weights   10/14/22 1722 10/15/22 0626 10/16/22 0500  Weight: 88 kg 81.2 kg 88.2 kg    Examination:  General exam: Appears calm and comfortable.  Currently on room air.  Elderly male lying in bed. Respiratory system: Bilateral decreased breath sounds at bases with some scattered crackles Cardiovascular system: S1 & S2 heard, Rate controlled Gastrointestinal system: Abdomen is nondistended, soft and nontender. Normal bowel sounds heard. Extremities: No cyanosis, clubbing; trace lower extremity edema Central  nervous system: Sleepy, wakes up slightly, poor historian.  No focal neurological deficits. Moving extremities Skin: No rashes, lesions or ulcers Psychiatry: Flat affect.  No signs of agitation Genitourinary: Foley catheter present    Data  Reviewed: I have personally reviewed following labs and imaging studies  CBC: Recent Labs  Lab 10/13/22 1356 10/14/22 0500 10/15/22 0317 10/16/22 0232  WBC 29.3* 17.7* 12.0* 9.7  NEUTROABS 23.8*  --   --   --   HGB 11.8* 10.2* 10.1* 10.6*  HCT 35.7* 30.0* 29.3* 30.4*  MCV 98.6 97.1 95.1 94.1  PLT 80* 50* 65* 64*   Basic Metabolic Panel: Recent Labs  Lab 10/13/22 1356 10/14/22 0500 10/15/22 0317 10/16/22 0232  NA 135 137 135 137  K 3.9 3.9 3.9 3.7  CL 101 104 102 105  CO2 21* 21* 25 23  GLUCOSE 110* 94 111* 103*  BUN 47* 45* 44* 40*  CREATININE 2.77* 2.28* 1.99* 1.71*  CALCIUM 8.8* 8.6* 8.8* 9.1  MG  --   --  2.2 2.2  PHOS  --   --  2.9 2.6   GFR: Estimated Creatinine Clearance: 28.4 mL/min (A) (by C-G formula based on SCr of 1.71 mg/dL (H)). Liver Function Tests: Recent Labs  Lab 10/13/22 1356 10/15/22 0317 10/16/22 0232  AST 24 19  --   ALT 24 24  --   ALKPHOS 69 75  --   BILITOT 0.8 0.8  --   PROT 6.7 5.8*  --   ALBUMIN 3.3* 2.7* 2.7*   No results for input(s): "LIPASE", "AMYLASE" in the last 168 hours. No results for input(s): "AMMONIA" in the last 168 hours. Coagulation Profile: Recent Labs  Lab 10/13/22 1356  INR 1.1   Cardiac Enzymes: No results for input(s): "CKTOTAL", "CKMB", "CKMBINDEX", "TROPONINI" in the last 168 hours. BNP (last 3 results) No results for input(s): "PROBNP" in the last 8760 hours. HbA1C: No results for input(s): "HGBA1C" in the last 72 hours. CBG: No results for input(s): "GLUCAP" in the last 168 hours. Lipid Profile: No results for input(s): "CHOL", "HDL", "LDLCALC", "TRIG", "CHOLHDL", "LDLDIRECT" in the last 72 hours. Thyroid Function Tests: No results for input(s): "TSH", "T4TOTAL", "FREET4", "T3FREE", "THYROIDAB" in the last 72 hours. Anemia Panel: No results for input(s): "VITAMINB12", "FOLATE", "FERRITIN", "TIBC", "IRON", "RETICCTPCT" in the last 72 hours. Sepsis Labs: Recent Labs  Lab 10/13/22 1356  10/13/22 1531  LATICACIDVEN 2.4* 1.8    Recent Results (from the past 240 hour(s))  Blood Culture (routine x 2)     Status: None (Preliminary result)   Collection Time: 10/13/22  1:56 PM   Specimen: BLOOD  Result Value Ref Range Status   Specimen Description BLOOD LEFT ANTECUBITAL  Final   Special Requests   Final    BOTTLES DRAWN AEROBIC AND ANAEROBIC Blood Culture adequate volume   Culture   Final    NO GROWTH 3 DAYS Performed at [facility], [address], [place], [place] [address]    Report Status PENDING  Incomplete  Urine Culture     Status: Abnormal   Collection Time: 10/13/22  2:30 PM   Specimen: In/Out Cath Urine  Result Value Ref Range Status   Specimen Description IN/OUT CATH URINE  Final   Special Requests   Final    NONE Performed at [facility], [place] [address], [place], [place] [address]    Culture MULTIPLE SPECIES PRESENT, SUGGEST RECOLLECTION (A)  Final   Report Status 10/14/2022 FINAL  Final  Blood Culture (routine x 2)     Status: None (Preliminary result)   Collection Time: 10/13/22  2:35 PM   Specimen: BLOOD  Result Value Ref Range Status   Specimen Description BLOOD LEFT  Final   Special Requests   Final    BOTTLES DRAWN AEROBIC ONLY Blood Culture results may not be optimal due to an inadequate volume of blood received in culture bottles   Culture   Final    NO GROWTH 3 DAYS Performed at [facility], [place] [address] [place], [place] [address]    Report Status PENDING  Incomplete         Radiology Studies: CT ABDOMEN PELVIS WO CONTRAST  Result Date: 10/14/2022 CLINICAL DATA:  Sepsis, abscess. History of recurrent urinary tract infection and pyelonephritis. Hypotension. Chronic indwelling catheter. EXAM: CT ABDOMEN AND PELVIS WITHOUT CONTRAST TECHNIQUE: Multidetector CT imaging of the abdomen and pelvis was performed following the standard protocol without IV contrast. RADIATION DOSE REDUCTION: This exam was performed  according to the departmental dose-optimization program which includes automated exposure control, adjustment of the mA and/or kV according to patient size and/or use of iterative reconstruction technique. COMPARISON:  08/29/2022 FINDINGS: Lower chest: Trace bilateral pleural effusions with increase in dependent atelectasis in both lower lobes. Stable mild nodularity in the left lower lobe including a 4 mm left lower lobe nodule on image 26 series 5. Descending thoracic aortic and coronary atherosclerosis noted with pacer leads observed. Hepatobiliary: Stable sub-centimeter hypodense hepatic lesions, probably cysts although technically nonspecific on today's noncontrast CT. Gallbladder unremarkable. Old granulomatous disease involving the liver noted. Pancreas: Unremarkable Spleen: Unremarkable Adrenals/Urinary Tract: Both adrenal glands appear normal. Fluid density renal lesions favor cysts on today's noncontrast exam. These cystic lesions do not require independent workup. No hydronephrosis or hydroureter. Calcifications along the renal hila are vascular in nature. Empty urinary bladder noted with a Foley catheter in the urinary bladder. There is some luminal gas in the urinary bladder particularly along what is thought to likely be anterior bladder diverticula, these are less likely to be small abscesses along the anterior margin of the urinary bladder but the lack of distention of the urinary bladder makes this a difficult assessment. There is some mild stranding in the adipose tissues anterior to the urinary bladder suggesting inflammation. Stomach/Bowel: Contrast medium in the distal esophagus suggest reflux or dysmotility. Borderline prominence of stool in the rectal vault. Notable sigmoid colon diverticulosis. No definite active diverticulitis. Vascular/Lymphatic: Atherosclerosis is present, including aortoiliac atherosclerotic disease. There is some atherosclerotic calcification in the superior mesenteric  artery and in both renal arteries. No pathologic adenopathy is observed. Reproductive: Dense calcifications in the prostate gland. Other: Trace fluid in the right paracolic gutter. Musculoskeletal: Trace subcutaneous and mesenteric edema diffusely. Lumbar spondylosis and degenerative disc disease with resulting foraminal impingement bilaterally at L3-4 and L4-5, and on the left at L5-S1. IMPRESSION: 1. Foley catheter in the urinary bladder with luminal gas in the urinary bladder particularly within suspected anterior bladder diverticula, these are less likely to be small abscesses along the anterior margin of the urinary bladder but the lack of distention of the urinary bladder makes this a difficult differentiation. There is some mild stranding in the adipose tissues anterior to the urinary bladder suggesting inflammation/cystitis. 2. Trace bilateral pleural effusions with increase in dependent atelectasis in both lower lobes. 3. Contrast medium in the distal esophagus suggest reflux or dysmotility. 4. Lumbar spondylosis and degenerative disc disease causing foraminal impingement bilaterally at L3-4  and L4-5 and on the left at L5-S1. 5. Aortic atherosclerosis. Aortic Atherosclerosis (ICD10-I70.0). Electronically Signed   By: Van Clines M.D.   On: 10/14/2022 17:08   US RENAL  Result Date: 10/14/2022 CLINICAL DATA:  Acute renal injury. EXAM: RENAL / URINARY TRACT ULTRASOUND COMPLETE COMPARISON:  CTA of the abdomen of 08/29/2022 FINDINGS: Right Kidney: Renal measurements: 10.0 x 4.5 x 4.8 cm = volume: 115 mL. Normal renal cortical thickness for age. 1.8 cm cyst. No hydronephrosis. Left Kidney: Renal measurements: 11.0 x 6.1 x 4.4 cm = volume: 155 mL. Normal cortical thickness for age. No hydronephrosis. Lower pole 2.4 cm renal cyst. Bladder: Decompressed around a Foley catheter. Other: None. IMPRESSION: No acute process or explanation for acute renal insufficiency. Electronically Signed   By: Abigail Miyamoto  M.D.   On: 10/14/2022 13:40        Scheduled Meds:  Chlorhexidine Gluconate Cloth  6 each Topical Daily   DULoxetine  60 mg Oral Daily   levothyroxine  25 mcg Oral QAC breakfast   loratadine  10 mg Oral Daily   midodrine  2.5 mg Oral TID WC   pantoprazole  40 mg Oral Daily   predniSONE  5 mg Oral Daily   pregabalin  50 mg Oral QHS   sodium chloride flush  3 mL Intravenous Q12H   topiramate  50 mg Oral BID   traMADol  50 mg Oral QHS   Continuous Infusions:  meropenem (MERREM) IV 500 mg (10/16/22 0443)          Aline August, MD [facility] 10/16/2022, 10:38 AM

## 2022-10-16 NOTE — Progress Notes (Addendum)
Pharmacy Antibiotic Note  Douglas Walker is a 88 y.o. male admitted on 10/13/2022 presenting with fever, vomiting and hypotension with concern for sepsis.  Hx of chronic cath with UCx in past growing ESBL E coli and proteus.  Pharmacy has been consulted for Merrem dosing.  Original urine culture on 10/15 taken from old catheter grew multiple species. Catheter was exchanged on 10/16 and repeat UA showed large leukocytes, rare bacteria, > 50 WBC. Repeat urine culture sent and is pending. CTAP suggesting cystitis. WBC 12 >> 9.7, afebrile. Scr 1.99 >> 1.71 (BL ~1.5-1.8)  Plan: Continue Merrem 500 mg IV q12h Monitor renal function, UCx and clinical progression to narrow  Height: '5\' 9"'$  (175.3 cm) Weight: 88.2 kg (194 lb 7.1 oz) IBW/kg (Calculated) : 70.7  Temp (24hrs), Avg:98.9 F (37.2 C), Min:98.2 F (36.8 C), Max:99.6 F (37.6 C)  Recent Labs  Lab 10/13/22 1356 10/13/22 1531 10/14/22 0500 10/15/22 0317 10/16/22 0232  WBC 29.3*  --  17.7* 12.0* 9.7  CREATININE 2.77*  --  2.28* 1.99* 1.71*  LATICACIDVEN 2.4* 1.8  --   --   --      Estimated Creatinine Clearance: 28.4 mL/min (A) (by C-G formula based on SCr of 1.71 mg/dL (H)).    Allergies  Allergen Reactions   Sulfa Antibiotics Other (See Comments)    Weakness Dizziness  Sweats    Mysoline [Primidone] Other (See Comments)    Sedation    Zocor [Simvastatin] Other (See Comments)    Arthralgias Fatigue   Antimicrobials this admission:  Merrem 10/15 >>   Dose adjustments this admission:   Microbiology results:  10/15 Bcx: NGTD 10/15 Ucx: mult species (taken from prev cath) 10/17 Ucx: ip   Dimple Nanas, PharmD, BCPS 10/16/2022 10:32 AM

## 2022-10-17 ENCOUNTER — Encounter: Payer: Self-pay | Admitting: Internal Medicine

## 2022-10-17 DIAGNOSIS — Z978 Presence of other specified devices: Secondary | ICD-10-CM | POA: Diagnosis not present

## 2022-10-17 DIAGNOSIS — Z7189 Other specified counseling: Secondary | ICD-10-CM | POA: Diagnosis not present

## 2022-10-17 DIAGNOSIS — N179 Acute kidney failure, unspecified: Secondary | ICD-10-CM | POA: Diagnosis not present

## 2022-10-17 DIAGNOSIS — Z515 Encounter for palliative care: Secondary | ICD-10-CM | POA: Diagnosis not present

## 2022-10-17 DIAGNOSIS — I5032 Chronic diastolic (congestive) heart failure: Secondary | ICD-10-CM | POA: Diagnosis not present

## 2022-10-17 DIAGNOSIS — A419 Sepsis, unspecified organism: Secondary | ICD-10-CM | POA: Diagnosis not present

## 2022-10-17 LAB — CBC WITH DIFFERENTIAL/PLATELET
Abs Immature Granulocytes: 0.06 10*3/uL (ref 0.00–0.07)
Basophils Absolute: 0 10*3/uL (ref 0.0–0.1)
Basophils Relative: 1 %
Eosinophils Absolute: 0.2 10*3/uL (ref 0.0–0.5)
Eosinophils Relative: 2 %
HCT: 28.5 % — ABNORMAL LOW (ref 39.0–52.0)
Hemoglobin: 9.8 g/dL — ABNORMAL LOW (ref 13.0–17.0)
Immature Granulocytes: 1 %
Lymphocytes Relative: 16 %
Lymphs Abs: 1 10*3/uL (ref 0.7–4.0)
MCH: 33 pg (ref 26.0–34.0)
MCHC: 34.4 g/dL (ref 30.0–36.0)
MCV: 96 fL (ref 80.0–100.0)
Monocytes Absolute: 0.9 10*3/uL (ref 0.1–1.0)
Monocytes Relative: 14 %
Neutro Abs: 4.4 10*3/uL (ref 1.7–7.7)
Neutrophils Relative %: 66 %
Platelets: 63 10*3/uL — ABNORMAL LOW (ref 150–400)
RBC: 2.97 MIL/uL — ABNORMAL LOW (ref 4.22–5.81)
RDW: 16 % — ABNORMAL HIGH (ref 11.5–15.5)
WBC: 6.5 10*3/uL (ref 4.0–10.5)
nRBC: 0 % (ref 0.0–0.2)

## 2022-10-17 NOTE — Progress Notes (Signed)
[facility]) Hospital Liaison note:  This patient has been referred to [place] program.   Unfortunately, the patient does not meet the criteria for our outpatient program at this time.  Thank you, Lorelee Market, LPN [facility] Liaison [phone]

## 2022-10-17 NOTE — Progress Notes (Signed)
Physical Therapy Treatment Patient Details Name: Douglas Walker MRN: [id number] DOB: [birth year] Today's Date: 10/17/2022   History of Present Illness Pt is a 70y M who presented 10/15 with acute fever, vomiting, rigors. Admitted with severe sepsis d/t complicated UTI in setting of chronic foley catheter. PMHx significant for A-fib, peripheral neuropathy, essential tremor, HTN, CKD stage IIIb, and Hx of falls.    PT Comments    Patient awake and alert on arrival. Agrees to OOB mobility and ambulation. Patient mobilizing with supervision for bed mobility, transfers and gait with RW. Recommendation for HHPT still remains appropriate, although could progress to his baseline and no longer need HH.     Recommendations for follow up therapy are one component of a multi-disciplinary discharge planning process, led by the attending physician.  Recommendations may be updated based on patient status, additional functional criteria and insurance authorization.  Follow Up Recommendations  Home health PT     Assistance Recommended at Discharge Frequent or constant Supervision/Assistance (due to ?cognition)  Patient can return home with the following A little help with walking and/or transfers;Assistance with cooking/housework;Direct supervision/assist for medications management;Direct supervision/assist for financial management;Assist for transportation   Equipment Recommendations  None recommended by PT    Recommendations for Other Services       Precautions / Restrictions Precautions Precautions: Fall Precaution Comments: Very HOH Restrictions Weight Bearing Restrictions: No     Mobility  Bed Mobility Overal bed mobility: Needs Assistance Bed Mobility: Supine to Sit     Supine to sit: Supervision     General bed mobility comments: HOB flat and no rail; no cues needed, supervision for safety but no dizziness    Transfers Overall transfer level: Needs assistance Equipment  used: Rolling walker (2 wheels) Transfers: Sit to/from Stand, Bed to chair/wheelchair/BSC Sit to Stand: Supervision           General transfer comment: proper sequencing with RW and hand placement, no cues needed for come to stand or to sit    Ambulation/Gait Ambulation/Gait assistance: Min guard, Supervision Gait Distance (Feet): 180 Feet Assistive device: Rolling walker (2 wheels) Gait Pattern/deviations: Step-through pattern, Decreased stride length, Shuffle   Gait velocity interpretation: >2.62 ft/sec, indicative of community ambulatory   General Gait Details: able to maneuver RW in turns in room; +UE tremors but no imbalance   Stairs             Wheelchair Mobility    Modified Rankin (Stroke Patients Only)       Balance Overall balance assessment: Needs assistance, History of Falls Sitting-balance support: Feet supported Sitting balance-Leahy Scale: Good     Standing balance support: During functional activity, Bilateral upper extremity supported Standing balance-Leahy Scale: Poor                              Cognition Arousal/Alertness: Awake/alert Behavior During Therapy: WFL for tasks assessed/performed Overall Cognitive Status: No family/caregiver present to determine baseline cognitive functioning Area of Impairment: Attention, Following commands                   Current Attention Level: Sustained   Following Commands: Follows multi-step commands inconsistently       General Comments: Pt easily distracted by nursing entering room as beginning to walk; required repeated cues to stay on task of walking, but once in hallway, no longer an issue        Exercises  General Comments        Pertinent Vitals/Pain Pain Assessment Pain Assessment: No/denies pain    Home Living                          Prior Function            PT Goals (current goals can now be found in the care plan section) Acute  Rehab PT Goals Patient Stated Goal: agrees to walk Time For Goal Achievement: 10/29/22 Potential to Achieve Goals: Good Progress towards PT goals: Progressing toward goals    Frequency    Min 3X/week      PT Plan Current plan remains appropriate    Co-evaluation              AM-PAC PT "6 Clicks" Mobility   Outcome Measure  Help needed turning from your back to your side while in a flat bed without using bedrails?: A Little Help needed moving from lying on your back to sitting on the side of a flat bed without using bedrails?: A Little Help needed moving to and from a bed to a chair (including a wheelchair)?: A Little Help needed standing up from a chair using your arms (e.g., wheelchair or bedside chair)?: A Little Help needed to walk in hospital room?: A Little Help needed climbing 3-5 steps with a railing? : A Little 6 Click Score: 18    End of Session Equipment Utilized During Treatment: Gait belt Activity Tolerance: Patient tolerated treatment well Patient left: in chair;with call bell/phone within reach;with chair alarm set Nurse Communication: Mobility status PT Visit Diagnosis: Unsteadiness on feet (R26.81);History of falling (Z91.81)     Time: 8250-0370 PT Time Calculation (min) (ACUTE ONLY): 19 min  Charges:  $Gait Training: 8-22 mins                      Arby Barrette, PT [place]  Office [phone]    Rexanne Mano 10/17/2022, 11:16 AM

## 2022-10-17 NOTE — Consult Note (Signed)
Palliative Medicine Inpatient Consult Note  Consulting Provider: Aline August, MD  Reason for consult:   [facility] Palliative Medicine Consult  Reason for Consult? Goals of care   10/17/2022  HPI:  Per intake H&P --> 88 year old M with PMH of diastolic CHF, A-fib on Eliquis, SSS/PPM, CVA, CKD-3B, anemia, AAA, chronic Foley, OA on chronic prednisone and right eye vision loss presented with fever, vomiting and admitted for severe sepsis due to complicated UTI in the setting of chronic Foley catheter, AKI and hypotension. On IV antibiotics. ID involved. Palliative care was asked to see to discuss goals of care in the setting of recurrent hospitalizations and multiple chronic comorbid conditions.  [name] was prior followed by [facility] palliative care though had been discharged from services due to clinical stability.  Clinical Assessment/Goals of Care:  *Please note that this is a verbal dictation therefore any spelling or grammatical errors are due to the "[place]" system interpretation.  I have reviewed medical records including EPIC notes, labs and imaging, received report from bedside RN, assessed the patient.    I met with Douglas Walker, his son Douglas Walker, and daughter-in-law Douglas Walker to further discuss diagnosis prognosis, GOC, EOL wishes, disposition and options.   I introduced Palliative Medicine as specialized medical care for people living with serious illness. It focuses on providing relief from the symptoms and stress of a serious illness. The goal is to improve quality of life for both the patient and the family.  Medical History Review and Understanding:  A review of patient's atrial fibrillation, diastolic heart failure, pacemaker insertion, prior stroke, stage III chronic kidney disease, and recurring urinary tract infections requiring a chronic Foley catheter was held.  Social History:  Douglas Walker lives with his family in [place], [place].  He is a widower as wife died 4-1/2 years ago.  He formerly was in [employer] and served in the [employer].  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  He enjoys listening to football.  He is a man of faith and practices within [employer] denomination.  Functional and Nutritional State:  Prior to hospitalization Douglas Walker was able to mobilize throughout his house with his front wheel walker minimal assist.  He was able to feed himself bathe himself and was continent.  Patient's son did observe him in the shower though for the most part patient was able to maintain a level of independence and self-care.  Douglas Walker has a decent appetite outside of the hospital setting.  Advance Directives:  A detailed discussion was had today regarding advanced directives.  Patient's son, Douglas Walker is his Air traffic controller.  Code Status:  Concepts specific to code status, artifical feeding and hydration, continued IV antibiotics and rehospitalization was had.  The difference between a aggressive medical intervention path  and a palliative comfort care path for this patient at this time was had.   I completed a MOST form today. The patient and family outlined their wishes for the following treatment decisions:  Cardiopulmonary Resuscitation: Do Not Attempt Resuscitation (DNR/No CPR)  Medical Interventions: Limited Additional Interventions: Use medical treatment, IV fluids and cardiac monitoring as indicated, DO NOT USE intubation or mechanical ventilation. May consider use of less invasive airway support such as BiPAP or CPAP. Also provide comfort measures. Transfer to the hospital if indicated. Avoid intensive care.   Antibiotics: Antibiotics if indicated  IV Fluids: IV fluids if indicated  Feeding Tube: No feeding tube    Discussion:  Patient's family shares  with me that they are very familiar with palliative care services as they were followed by [facility].  They share that  they really enjoyed these visits and having extra set of eyes on Douglas Walker and his health state.  They reviewed that the patient had been signed off of services given clinical stability.  They would be interested in enrollment in outpatient palliative support if that were to be offered again.  We reviewed the circumstances surrounding [name]'s hospitalization given his UTI.  Family was a bit thrown off as his symptoms were quite different than the last time he was hospitalized for urosepsis.  They shared that they had not checked his blood pressure therefore did not realize his situation was so critical at the time of admission.  Reviewed that they are doing the best job they can to care for him and have had experiences with other relatives enable him them to be the patient's best advocates.  Discussed hospitalization and how this contributes to delirium.  Reviewed the fears about patient going home and dislodging his midline which is in for IV antibiotics.  Patient's family would feel more comfortable if patient either went to a facility or remained in house for continuation of antibiotics-these will be completed on Tuesday.  Goals remain for patient to be as independent as possible and to remain living in his home.  Discussed the importance of continued conversation with family and their  medical providers regarding overall plan of care and treatment options, ensuring decisions are within the context of the patients values and GOCs.  Decision Maker: Douglas Walker [phone]   [phone]   SUMMARY OF RECOMMENDATIONS   DNAR/DNI  MOST Completed, paper copy placed onto the chart electric copy can be found in [facility]  DNR Form Completed, paper copy placed onto the chart electric copy can be found in [facility]  Patient's son would prefer if patient either goes to SNF for remains in the hospital until completion of IV antibiotics  OP Palliative support on discharge --> no longer meets criteria for  [facility]  Palliative Care will continue to check in incrementally  Code Status/Advance Care Planning: DNAR/DNI  Palliative Prophylaxis:  Aspiration, Bowel Regimen, Delirium Protocol, Frequent Pain Assessment, Oral Care, Palliative Wound Care, and Turn Reposition  Additional Recommendations (Limitations, Scope, Preferences): Continue current treatment  Psycho-social/Spiritual:  Desire for further Chaplaincy support: Not presently Additional Recommendations: Ongoing support - discussion of OP Palliative care services   Prognosis: Multiple chronic comorbidities  Discharge Planning: Discharge to home once medically optimized.  Vitals:   10/17/22 0545 10/17/22 0759  BP: 112/63 122/70  Pulse: 70 69  Resp: 18 17  Temp: 97.9 F (36.6 C) 97.6 F (36.4 C)  SpO2: 95% 94%    Intake/Output Summary (Last 24 hours) at 10/17/2022 1435 Last data filed at 10/17/2022 1300 Gross per 24 hour  Intake 420 ml  Output 2075 ml  Net -1655 ml   Last Weight  Most recent update: 10/16/2022  5:57 AM    Weight  88.2 kg (194 lb 7.1 oz)            Gen: Elder Caucasian male in no acute distress HEENT: moist mucous membranes CV: Regular rate and rhythm PULM: On room air breathing is even and nonlabored ABD: soft/nontender EXT: No edema Neuro: Alert and oriented x3 -hard of hearing delayed responses  PPS: 50%   This conversation/these recommendations were discussed with patient primary care team, Dr. Starla Link  Billing based on MDM: High  Problems Addressed: One acute or chronic illness or injury that poses a threat to life or bodily function  Amount and/or Complexity of Data: Category 3:Discussion of management or test interpretation with external physician/other qualified health care professional/appropriate source (not separately reported)  Risks: Decision regarding hospitalization or escalation of hospital care and Decision not to resuscitate or to de-escalate care because of poor  prognosis ______________________________________________________ [place] Team Team Cell Phone: [phone] Please utilize secure chat with additional questions, if there is no response within 30 minutes please call the above phone number  Palliative Medicine Team providers are available by phone from 7am to 7pm daily and can be reached through the team cell phone.  Should this patient require assistance outside of these hours, please call the patient's attending physician.

## 2022-10-17 NOTE — TOC Progression Note (Signed)
Transition of Care [facility]) - Initial/Assessment Note    Patient Details  Name: Douglas Walker MRN: [id number] Date of Birth: 12-01-1925  [facility] CM/SW Contact:    Milinda Antis, [place] Phone Number: 10/17/2022, 11:09 AM  Clinical Narrative:                LCSW reached out to the patient's family to discuss discharge planning.  The family reports that they have concerns for the patient coming home with a midline, due to the patient being confused.    The patient does not have skillable need for SNF and will only need antibiotics for 5 more days.    MD informed.         Patient Goals and CMS Choice        Expected Discharge Plan and Services                                                Prior Living Arrangements/Services                       Activities of Daily Living Home Assistive Devices/Equipment: Other (Comment) ADL Screening (condition at time of admission) Patient's cognitive ability adequate to safely complete daily activities?: Yes Is the patient deaf or have difficulty hearing?: No Does the patient have difficulty seeing, even when wearing glasses/contacts?: Yes Does the patient have difficulty concentrating, remembering, or making decisions?: No Patient able to express need for assistance with ADLs?: Yes Does the patient have difficulty dressing or bathing?: Yes Independently performs ADLs?: No Does the patient have difficulty walking or climbing stairs?: Yes Weakness of Legs: Both Weakness of Arms/Hands: None  Permission Sought/Granted                  Emotional Assessment              Admission diagnosis:  Thrombocytopenia ([place]) [D69.6] AKI (acute kidney injury) ([place]) [N17.9] Sepsis secondary to UTI ([place]) [A41.9, N39.0] Sepsis ([place]) [A41.9] Catheter cystitis, initial encounter ([place]) [T83.518A, N30.90] Vomiting, unspecified vomiting type, unspecified whether nausea present [R11.10] Patient Active  Problem List   Diagnosis Date Noted   Severe sepsis ([place]) 10/13/2022   Nausea and vomiting 10/13/2022   Normocytic anemia 10/13/2022   AAA (abdominal aortic aneurysm) ([place]) 10/13/2022   Physical deconditioning 08/29/2022   Eye pain, right 08/10/2022   Blind painful right eye 08/08/2022   Vitreous hemorrhage of right eye ([place]) 08/05/2022   Choroidal hemorrhage, right 08/05/2022   Secondary glaucoma due to combination mechanisms, right, moderate stage 08/05/2022   History of corneal transplant 05/16/2022   Recurrent UTI 01/31/2022   Chronic indwelling Foley catheter 01/31/2022   Benign prostatic hyperplasia with lower urinary tract symptoms 01/31/2022   Medication monitoring encounter 01/31/2022   Multiple drug resistant organism (MDRO) culture positive 01/31/2022   Bacteremia due to Proteus species 10/11/2021   Acute kidney injury superimposed on chronic kidney disease ([place]) 78/29/5621   Complicated UTI (urinary tract infection) 07/02/2021   Pain in both testicles    Right hemiparesis ([place]) 04/23/2021   Cerebrovascular accident (CVA) due to embolism of left middle cerebral artery ([place]) 04/23/2021   Neuropathy 04/23/2021   Abnormality of gait 04/23/2021   Abnormal results of thyroid function studies 04/04/2021   Acquired iron deficiency anemia due to decreased absorption 04/04/2021  Acquired thrombophilia ([place]) 04/04/2021   Cerebral atherosclerosis 04/04/2021   Basal cell carcinoma of nose 04/04/2021   Benign hypertensive heart and renal disease, with heart and renal failure ([place]) 04/04/2021   Benign prostatic hyperplasia 04/04/2021   Obesity 04/04/2021   Bradycardia 04/04/2021   Closed fracture of one rib 04/04/2021   Constipation by delayed colonic transit 04/04/2021   Fatigue 04/04/2021   Gastro-esophageal reflux disease without esophagitis 04/04/2021   Hypothyroidism 04/04/2021   Left lower quadrant pain 04/04/2021   Lumbar radiculopathy 04/04/2021   Reactive depression  04/04/2021   Mass of chest wall 04/04/2021   Mixed hyperlipidemia 04/04/2021   Nocturia 04/04/2021   Other shoulder lesions, right shoulder 04/04/2021   Pain in limb 04/04/2021   Peripheral edema 04/04/2021   Personal history of malignant neoplasm of bladder 04/04/2021   Pruritus of genitalia 04/04/2021   Tremor 04/04/2021   Unilateral primary osteoarthritis, left knee 04/04/2021   Unspecified mononeuropathy of unspecified lower limb 04/04/2021   Urinary incontinence 04/04/2021   Dysphagia, post-stroke    Chronic diastolic congestive heart failure (HCC)    Chronic pain syndrome    Left middle cerebral artery stroke ([place]) 02/01/2021   Acute CVA (cerebrovascular accident) ([place]) 01/28/2021   Pacemaker 08/30/2020   Hematoma, chest wall 07/24/2020   Exudative age-related macular degeneration of right eye with inactive choroidal neovascularization ([place]) 05/10/2020   Exudative age-related macular degeneration of left eye with inactive choroidal neovascularization ([place]) 05/10/2020   Retinal hemorrhage of right eye 05/10/2020   Exposure keratopathy, bilateral 05/10/2020   Bilateral dry eyes 05/10/2020   Advanced nonexudative age-related macular degeneration of both eyes with subfoveal involvement 05/10/2020   Complete heart block ([place]) 12/04/2018   Chronic low back pain 12/15/2017   Hereditary and idiopathic peripheral neuropathy 07/01/2016   Essential tremor 02/28/2016   BPPV (benign paroxysmal positional vertigo) 06/19/2015   Dizziness 06/18/2015   Bacteria in urine 06/18/2015   Stage 3a chronic kidney disease (CKD) ([place]) 06/18/2015   Chronic diastolic CHF (congestive heart failure) ([place]) 05/30/2015   Fall    Syncope 05/19/2015   Lower urinary tract infectious disease 05/19/2015   Hypotension 05/19/2015   Dehydration 05/19/2015   Fracture of rib of left side 05/19/2015   Syncope and collapse 05/19/2015   Mobitz type II atrioventricular block 09/13/2014   Essential hypertension  09/13/2014   Chronic anticoagulation 09/13/2014   SSS (sick sinus syndrome) ([place]) 09/13/2014   Paroxysmal atrial fibrillation ([place]) 10/28/2013   Thrombocytopenia ([place]) 04/06/2013   PCP:  Corliss Blacker, MD Pharmacy:   [facility] [place] [facility] [address] [place] [address] Phone: [phone] Fax: [phone]     Social Determinants of Health (SDOH) Interventions    Readmission Risk Interventions    10/15/2021   10:43 AM 07/26/2020   10:05 AM  Readmission Risk Prevention Plan  Transportation Screening Complete Complete  PCP or Specialist Appt within 5-7 Days  Not Complete  Not Complete comments  pending disposition  Home Care Screening  Complete  Medication Review (RN CM)  Referral to Pharmacy  Medication Review (RN Care Manager) Complete   PCP or Specialist appointment within 3-5 days of discharge Complete   HRI or Home Care Consult Complete   SW Recovery Care/Counseling Consult Complete   Palliative Care Screening Not Applicable   Skilled Nursing Facility Complete

## 2022-10-17 NOTE — Progress Notes (Addendum)
PROGRESS NOTE    Douglas Walker  WUJ:[id number] DOB: Jun 19, 1925 DOA: 10/13/2022 PCP: Corliss Blacker, MD   Brief Narrative:  88 year old M with PMH of diastolic CHF, A-fib on Eliquis, SSS/PPM, CVA, CKD-3B, anemia, AAA, chronic Foley, OA on chronic prednisone and right eye vision loss presented with fever, vomiting and admitted for severe sepsis due to complicated UTI in the setting of chronic Foley catheter, AKI and hypotension.  He was started on broad-spectrum antibiotics.  ID was consulted.  Foley catheter was exchanged the next day.  Subsequently, urology was added for abnormal CT of the abdomen and pelvis.  Assessment & Plan:   Severe sepsis: Present on admission Complicated UTI/catheter associated UTI in a patient with chronic Foley catheter: Present on admission -Currently on ertapenem.  ID recommended to complete 10 days of antibiotics with end date being 10/22/2022.  Midline catheter placed on 10/16/2022.  Outpatient follow-up with ID.   -Foley catheter has already been exchanged on 10/14/2022.  Repeat urine culture after Foley exchange was negative.  Initial urine culture from prior Foley grew multiple species -CT of the abdomen and pelvis concerning for?  Bladder abscess.  I urology evaluation appreciated: Urology does not think that patient has bladder wall abscess but thinks that CT findings suggest bladder diverticula.  Urology recommends outpatient follow-up with urology. -Blood cultures negative so far -Hemodynamically improving and currently afebrile  AKI on CKD stage IIIb -Creatinine 2.77 on presentation.  Much improved to 1.71 on 10/16/2022.  Treated with IV fluids.  Creatinine pending today.  Repeat a.m. labs  Nausea and vomiting -Resolved  Leukocytosis -Resolved  Anemia of chronic disease Hematuria -Hemoglobin currently stable.  Eliquis on hold.  Resume Eliquis on discharge if hemoglobin remains stable.   Diastolic CHF -Echo in 08/5620 had shown EF of 50  to 55%.  Currently compensated.  Strict input and output.  Daily weights.  Lasix on hold.  Outpatient follow-up with cardiology  Persistent A-fib/SSS status post pacemaker -Eliquis on hold as above due to gross hematuria after Foley exchange. -Currently controlled.  Outpatient follow-up with cardiology  Chronic pain syndrome--continue Cymbalta  Hypothyroidism -Continue levothyroxine  Osteoarthritis-continue home prednisone  AAA - 5.1 cm ascending thoracic aortic aneurysm noted last on CT on 08/29/2022. -Continue outpatient monitoring  Physical deconditioning -Family interested in SNF placement.  TOC following.  DVT prophylaxis: SCDs Code Status: DNR Family Communication: None at bedside Disposition Plan: Status is: Inpatient Remains inpatient appropriate because: Of severity of illness.  Medically stable for discharge on IV antibiotics to a facility.    Consultants: ID/urology  Procedures: None  Antimicrobials: Meropenem from 10/13/2022 onwards   Subjective: Patient seen and examined at bedside.  Awake, poor historian.  No seizures, fever, agitation or vomiting reported. Objective: Vitals:   10/16/22 1017 10/16/22 2238 10/17/22 0545 10/17/22 0759  BP: 123/65 136/67 112/63 122/70  Pulse: 70 70 70 69  Resp:  '16 18 17  '$ Temp: 98.2 F (36.8 C) 98 F (36.7 C) 97.9 F (36.6 C) 97.6 F (36.4 C)  TempSrc: Oral Oral Oral Oral  SpO2:  98% 95% 94%  Weight:      Height:        Intake/Output Summary (Last 24 hours) at 10/17/2022 1001 Last data filed at 10/17/2022 0900 Gross per 24 hour  Intake 280 ml  Output 1875 ml  Net -1595 ml    Filed Weights   10/14/22 1722 10/15/22 0626 10/16/22 0500  Weight: 88 kg 81.2 kg 88.2 kg  Examination:  General: On room air.  No distress.  Elderly male lying in bed.  Poor historian.  Hard of hearing. ENT/neck: No thyromegaly.  JVD is not elevated  respiratory: Decreased breath sounds at bases bilaterally with some crackles; no  wheezing  CVS: S1-S2 heard, rate controlled currently Abdominal: Soft, nontender, slightly distended; no organomegaly, bowel sounds are heard Extremities: Trace lower extremity edema; no cyanosis  CNS: Awake, extremely slow to respond.  No focal neurologic deficit.  Moves extremities Lymph: No obvious lymphadenopathy Skin: No obvious ecchymosis/lesions  psych: Flat affect currently.  Not agitated currently. Genitourinary: Still has Foley catheter     Data Reviewed: I have personally reviewed following labs and imaging studies  CBC: Recent Labs  Lab 10/13/22 1356 10/14/22 0500 10/15/22 0317 10/16/22 0232 10/17/22 0445  WBC 29.3* 17.7* 12.0* 9.7 6.5  NEUTROABS 23.8*  --   --   --  4.4  HGB 11.8* 10.2* 10.1* 10.6* 9.8*  HCT 35.7* 30.0* 29.3* 30.4* 28.5*  MCV 98.6 97.1 95.1 94.1 96.0  PLT 80* 50* 65* 64* 63*    Basic Metabolic Panel: Recent Labs  Lab 10/13/22 1356 10/14/22 0500 10/15/22 0317 10/16/22 0232  NA 135 137 135 137  K 3.9 3.9 3.9 3.7  CL 101 104 102 105  CO2 21* 21* 25 23  GLUCOSE 110* 94 111* 103*  BUN 47* 45* 44* 40*  CREATININE 2.77* 2.28* 1.99* 1.71*  CALCIUM 8.8* 8.6* 8.8* 9.1  MG  --   --  2.2 2.2  PHOS  --   --  2.9 2.6    GFR: Estimated Creatinine Clearance: 28.4 mL/min (A) (by C-G formula based on SCr of 1.71 mg/dL (H)). Liver Function Tests: Recent Labs  Lab 10/13/22 1356 10/15/22 0317 10/16/22 0232  AST 24 19  --   ALT 24 24  --   ALKPHOS 69 75  --   BILITOT 0.8 0.8  --   PROT 6.7 5.8*  --   ALBUMIN 3.3* 2.7* 2.7*    No results for input(s): "LIPASE", "AMYLASE" in the last 168 hours. No results for input(s): "AMMONIA" in the last 168 hours. Coagulation Profile: Recent Labs  Lab 10/13/22 1356  INR 1.1    Cardiac Enzymes: No results for input(s): "CKTOTAL", "CKMB", "CKMBINDEX", "TROPONINI" in the last 168 hours. BNP (last 3 results) No results for input(s): "PROBNP" in the last 8760 hours. HbA1C: No results for input(s):  "HGBA1C" in the last 72 hours. CBG: No results for input(s): "GLUCAP" in the last 168 hours. Lipid Profile: No results for input(s): "CHOL", "HDL", "LDLCALC", "TRIG", "CHOLHDL", "LDLDIRECT" in the last 72 hours. Thyroid Function Tests: No results for input(s): "TSH", "T4TOTAL", "FREET4", "T3FREE", "THYROIDAB" in the last 72 hours. Anemia Panel: No results for input(s): "VITAMINB12", "FOLATE", "FERRITIN", "TIBC", "IRON", "RETICCTPCT" in the last 72 hours. Sepsis Labs: Recent Labs  Lab 10/13/22 1356 10/13/22 1531  LATICACIDVEN 2.4* 1.8     Recent Results (from the past 240 hour(s))  Blood Culture (routine x 2)     Status: None (Preliminary result)   Collection Time: 10/13/22  1:56 PM   Specimen: BLOOD  Result Value Ref Range Status   Specimen Description BLOOD LEFT ANTECUBITAL  Final   Special Requests   Final    BOTTLES DRAWN AEROBIC AND ANAEROBIC Blood Culture adequate volume   Culture   Final    NO GROWTH 4 DAYS Performed at [facility], [address], [place], [place] [address]    Report  Status PENDING  Incomplete  Urine Culture     Status: Abnormal   Collection Time: 10/13/22  2:30 PM   Specimen: In/Out Cath Urine  Result Value Ref Range Status   Specimen Description IN/OUT CATH URINE  Final   Special Requests   Final    NONE Performed at [facility], [address] [place], [place] [address]    Culture MULTIPLE SPECIES PRESENT, SUGGEST RECOLLECTION (A)  Final   Report Status 10/14/2022 FINAL  Final  Blood Culture (routine x 2)     Status: None (Preliminary result)   Collection Time: 10/13/22  2:35 PM   Specimen: BLOOD  Result Value Ref Range Status   Specimen Description BLOOD LEFT  Final   Special Requests   Final    BOTTLES DRAWN AEROBIC ONLY Blood Culture results may not be optimal due to an inadequate volume of blood received in culture bottles   Culture   Final    NO GROWTH 4 DAYS Performed at [facility], [place] [address]  [place], [place] [address]    Report Status PENDING  Incomplete  Remove and replace urinary cath (placed > 5 days) then obtain urine culture from new indwelling urinary catheter.     Status: None   Collection Time: 10/14/22 12:40 PM   Specimen: Urine, Catheterized  Result Value Ref Range Status   Specimen Description URINE, CATHETERIZED  Final   Special Requests NONE  Final   Culture   Final    NO GROWTH Performed at [facility], [address], [place], [place] [address]    Report Status 10/16/2022 FINAL  Final         Radiology Studies: No results found.      Scheduled Meds:  Chlorhexidine Gluconate Cloth  6 each Topical Daily   DULoxetine  60 mg Oral Daily   levothyroxine  25 mcg Oral QAC breakfast   loratadine  10 mg Oral Daily   midodrine  2.5 mg Oral TID WC   pantoprazole  40 mg Oral Daily   predniSONE  5 mg Oral Daily   pregabalin  50 mg Oral QHS   sodium chloride flush  10-40 mL Intracatheter Q12H   sodium chloride flush  3 mL Intravenous Q12H   topiramate  50 mg Oral BID   traMADol  50 mg Oral QHS   Continuous Infusions:  ertapenem            Aline August, MD [facility] 10/17/2022, 10:01 AM

## 2022-10-18 DIAGNOSIS — A419 Sepsis, unspecified organism: Secondary | ICD-10-CM | POA: Diagnosis not present

## 2022-10-18 DIAGNOSIS — N390 Urinary tract infection, site not specified: Secondary | ICD-10-CM | POA: Diagnosis not present

## 2022-10-18 DIAGNOSIS — Z515 Encounter for palliative care: Secondary | ICD-10-CM | POA: Diagnosis not present

## 2022-10-18 DIAGNOSIS — R6520 Severe sepsis without septic shock: Secondary | ICD-10-CM | POA: Diagnosis not present

## 2022-10-18 DIAGNOSIS — N179 Acute kidney failure, unspecified: Secondary | ICD-10-CM | POA: Diagnosis not present

## 2022-10-18 LAB — CBC WITH DIFFERENTIAL/PLATELET
Abs Immature Granulocytes: 0.07 10*3/uL (ref 0.00–0.07)
Basophils Absolute: 0 10*3/uL (ref 0.0–0.1)
Basophils Relative: 1 %
Eosinophils Absolute: 0.2 10*3/uL (ref 0.0–0.5)
Eosinophils Relative: 3 %
HCT: 29.7 % — ABNORMAL LOW (ref 39.0–52.0)
Hemoglobin: 10 g/dL — ABNORMAL LOW (ref 13.0–17.0)
Immature Granulocytes: 1 %
Lymphocytes Relative: 26 %
Lymphs Abs: 1.5 10*3/uL (ref 0.7–4.0)
MCH: 32.4 pg (ref 26.0–34.0)
MCHC: 33.7 g/dL (ref 30.0–36.0)
MCV: 96.1 fL (ref 80.0–100.0)
Monocytes Absolute: 0.7 10*3/uL (ref 0.1–1.0)
Monocytes Relative: 12 %
Neutro Abs: 3.3 10*3/uL (ref 1.7–7.7)
Neutrophils Relative %: 57 %
Platelets: 74 10*3/uL — ABNORMAL LOW (ref 150–400)
RBC: 3.09 MIL/uL — ABNORMAL LOW (ref 4.22–5.81)
RDW: 16.2 % — ABNORMAL HIGH (ref 11.5–15.5)
WBC: 5.8 10*3/uL (ref 4.0–10.5)
nRBC: 0 % (ref 0.0–0.2)

## 2022-10-18 LAB — CULTURE, BLOOD (ROUTINE X 2)
Culture: NO GROWTH
Culture: NO GROWTH
Special Requests: ADEQUATE

## 2022-10-18 LAB — BASIC METABOLIC PANEL
Anion gap: 7 (ref 5–15)
BUN: 33 mg/dL — ABNORMAL HIGH (ref 8–23)
CO2: 25 mmol/L (ref 22–32)
Calcium: 8.8 mg/dL — ABNORMAL LOW (ref 8.9–10.3)
Chloride: 107 mmol/L (ref 98–111)
Creatinine, Ser: 1.48 mg/dL — ABNORMAL HIGH (ref 0.61–1.24)
GFR, Estimated: 43 mL/min — ABNORMAL LOW (ref >60)
Glucose, Bld: 96 mg/dL (ref 70–99)
Potassium: 3.7 mmol/L (ref 3.5–5.1)
Sodium: 139 mmol/L (ref 135–145)

## 2022-10-18 LAB — MAGNESIUM: Magnesium: 2.1 mg/dL (ref 1.7–2.4)

## 2022-10-18 NOTE — Progress Notes (Signed)
Occupational Therapy Treatment Patient Details Name: Douglas Walker MRN: [id number] DOB: [birth year] Today's Date: 10/18/2022   History of present illness Pt is a 66y M who presented 10/15 with acute fever, vomiting, rigors. Admitted with severe sepsis d/t complicated UTI in setting of chronic foley catheter. PMHx significant for A-fib, peripheral neuropathy, essential tremor, HTN, CKD stage IIIb, and Hx of falls.   OT comments  Patient needs generalized supervision, cues and light assist for lower body ADL.  OT reduced frequency, and HH OT can be considered if family wants it.     Recommendations for follow up therapy are one component of a multi-disciplinary discharge planning process, led by the attending physician.  Recommendations may be updated based on patient status, additional functional criteria and insurance authorization.    Follow Up Recommendations  No OT follow up    Assistance Recommended at Discharge Frequent or constant Supervision/Assistance  Patient can return home with the following  A little help with walking and/or transfers;A little help with bathing/dressing/bathroom;Direct supervision/assist for medications management;Direct supervision/assist for financial management   Equipment Recommendations  None recommended by OT    Recommendations for Other Services      Precautions / Restrictions Precautions Precautions: Fall Precaution Comments: Very HOH Restrictions Weight Bearing Restrictions: No       Mobility Bed Mobility Overal bed mobility: Needs Assistance Bed Mobility: Supine to Sit     Supine to sit: Supervision          Transfers                         Balance Overall balance assessment: Needs assistance, History of Falls Sitting-balance support: Feet supported Sitting balance-Leahy Scale: Good     Standing balance support: Reliant on assistive device for balance Standing balance-Leahy Scale: Poor                              ADL either performed or assessed with clinical judgement   ADL   Eating/Feeding: Set up;Sitting   Grooming: Set up;Standing           Upper Body Dressing : Supervision/safety;Sitting   Lower Body Dressing: Supervision/safety;Sit to/from stand   Toilet Transfer: Supervision/safety;Ambulation;Rolling walker (2 wheels)                  Extremity/Trunk Assessment              Vision       Perception     Praxis      Cognition Arousal/Alertness: Awake/alert Behavior During Therapy: Flat affect Overall Cognitive Status: History of cognitive impairments - at baseline                                          Exercises      Shoulder Instructions       General Comments      Pertinent Vitals/ Pain       Pain Assessment Pain Assessment: No/denies pain                                                          Frequency  Min  2X/week        Progress Toward Goals  OT Goals(current goals can now be found in the care plan section)  Progress towards OT goals: Progressing toward goals  Acute Rehab OT Goals OT Goal Formulation: With patient Time For Goal Achievement: 10/31/22 Potential to Achieve Goals: Good  Plan Discharge plan needs to be updated    Co-evaluation                 AM-PAC OT "6 Clicks" Daily Activity     Outcome Measure   Help from another person eating meals?: None Help from another person taking care of personal grooming?: None Help from another person toileting, which includes using toliet, bedpan, or urinal?: A Little Help from another person bathing (including washing, rinsing, drying)?: A Little Help from another person to put on and taking off regular upper body clothing?: None Help from another person to put on and taking off regular lower body clothing?: A Little 6 Click Score: 21    End of Session Equipment Utilized During Treatment: Rolling walker (2  wheels)  OT Visit Diagnosis: Repeated falls (R29.6);Muscle weakness (generalized) (M62.81)   Activity Tolerance Patient tolerated treatment well   Patient Left in chair;with call bell/phone within reach;with chair alarm set   Nurse Communication Mobility status        Time: 5009-3818 OT Time Calculation (min): 31 min  Charges: OT General Charges $OT Visit: 1 Visit OT Treatments $Self Care/Home Management : 23-37 mins  10/18/2022  RP, OTR/L  Acute Rehabilitation Services  Office:  [phone]   Douglas Walker 10/18/2022, 8:51 AM

## 2022-10-18 NOTE — Progress Notes (Signed)
Physical Therapy Treatment Patient Details Name: Douglas Walker MRN: [id number] DOB: 06/20/1925 Today's Date: 10/18/2022   History of Present Illness Pt is a 28y M who presented 10/15 with acute fever, vomiting, rigors. Admitted with severe sepsis d/t complicated UTI in setting of chronic foley catheter. PMHx significant for A-fib, peripheral neuropathy, essential tremor, HTN, CKD stage IIIb, and Hx of falls.    PT Comments    Pt continues to make good progress towards his goals, requiring supervision for all mobility today. Pt requires cuing to compensate for visual deficits. Daughter and son are in room and express concern about pt d/c home with IV antibiotics as pt with poor cognition and memory deficit. Family afraid that he will pull line out not knowing. RN in room at end of session addressing family concern. D/c plan remains appropriate at this time. PT will continue to follow acutely.    Recommendations for follow up therapy are one component of a multi-disciplinary discharge planning process, led by the attending physician.  Recommendations may be updated based on patient status, additional functional criteria and insurance authorization.  Follow Up Recommendations  Home health PT     Assistance Recommended at Discharge Frequent or constant Supervision/Assistance  Patient can return home with the following A little help with walking and/or transfers;Assistance with cooking/housework;Direct supervision/assist for medications management;Direct supervision/assist for financial management;Assist for transportation   Equipment Recommendations  None recommended by PT       Precautions / Restrictions Precautions Precautions: Fall Precaution Comments: Very HOH Restrictions Weight Bearing Restrictions: No     Mobility  Bed Mobility Overal bed mobility: Needs Assistance Bed Mobility: Supine to Sit, Sit to Supine     Supine to sit: Supervision Sit to supine: Supervision    General bed mobility comments: HoB flattened, vc for scooting along EoB prior to return to bed, supervision for safety getting up and lying down    Transfers Overall transfer level: Needs assistance Equipment used: Rolling walker (2 wheels) Transfers: Sit to/from Stand, Bed to chair/wheelchair/BSC Sit to Stand: Supervision           General transfer comment: supervision for safety, vc for hand placment for power up    Ambulation/Gait Ambulation/Gait assistance: Supervision Gait Distance (Feet): 220 Feet Assistive device: Rolling walker (2 wheels) Gait Pattern/deviations: Step-through pattern, Decreased stride length, Shuffle Gait velocity: WFL Gait velocity interpretation: >2.62 ft/sec, indicative of community ambulatory   General Gait Details: mild instability but no LoB, requires cuing for navigation around obstacles in hallway given his visual deficit       Balance Overall balance assessment: Needs assistance, History of Falls Sitting-balance support: Feet supported Sitting balance-Leahy Scale: Good     Standing balance support: Reliant on assistive device for balance Standing balance-Leahy Scale: Poor                              Cognition Arousal/Alertness: Awake/alert Behavior During Therapy: Flat affect Overall Cognitive Status: History of cognitive impairments - at baseline                                             General Comments General comments (skin integrity, edema, etc.): son and daughter in room, expressing concern about d/c home with Midline as pt picks at things and will forget that he has IV  Pertinent Vitals/Pain  No pain      PT Goals (current goals can now be found in the care plan section) Acute Rehab PT Goals PT Goal Formulation: With patient Time For Goal Achievement: 10/29/22 Potential to Achieve Goals: Good Progress towards PT goals: Progressing toward goals    Frequency    Min  3X/week      PT Plan Current plan remains appropriate       AM-PAC PT "6 Clicks" Mobility   Outcome Measure  Help needed turning from your back to your side while in a flat bed without using bedrails?: None Help needed moving from lying on your back to sitting on the side of a flat bed without using bedrails?: None Help needed moving to and from a bed to a chair (including a wheelchair)?: None Help needed standing up from a chair using your arms (e.g., wheelchair or bedside chair)?: None Help needed to walk in hospital room?: A Little Help needed climbing 3-5 steps with a railing? : A Little 6 Click Score: 22    End of Session Equipment Utilized During Treatment: Gait belt Activity Tolerance: Patient tolerated treatment well Patient left: in bed;with bed alarm set;with call bell/phone within reach;with nursing/sitter in room;with family/visitor present Nurse Communication: Mobility status PT Visit Diagnosis: Unsteadiness on feet (R26.81);History of falling (Z91.81)     Time: 0459-9774 PT Time Calculation (min) (ACUTE ONLY): 18 min  Charges:  $Therapeutic Exercise: 8-22 mins                     Burnis Kaser B. Migdalia Dk PT, DPT Acute Rehabilitation Services Please use secure chat or  Call Office [phone]    [place] 10/18/2022, 2:17 PM

## 2022-10-18 NOTE — Progress Notes (Addendum)
PROGRESS NOTE    Douglas Walker  GMW:[id number] DOB: April 15, 1925 DOA: 10/13/2022 PCP: Corliss Blacker, MD   Brief Narrative:  88 year old M with PMH of diastolic CHF, A-fib on Eliquis, SSS/PPM, CVA, CKD-3B, anemia, AAA, chronic Foley, OA on chronic prednisone and right eye vision loss presented with fever, vomiting and admitted for severe sepsis due to complicated UTI in the setting of chronic Foley catheter, AKI and hypotension.  He was started on broad-spectrum antibiotics.  ID was consulted.  Foley catheter was exchanged the next day.  Subsequently, urology was added for abnormal CT of the abdomen and pelvis.  Assessment & Plan:   Severe sepsis: Present on admission Complicated UTI/catheter associated UTI in a patient with chronic Foley catheter: Present on admission -Currently on ertapenem.  ID recommended to complete 10 days of antibiotics with end date being 10/22/2022.  Midline catheter placed on 10/16/2022.  Outpatient follow-up with ID.   -Foley catheter has already been exchanged on 10/14/2022.  Repeat urine culture after Foley exchange was negative.  Initial urine culture from prior Foley grew multiple species -CT of the abdomen and pelvis concerning for?  Bladder abscess.  I urology evaluation appreciated: Urology does not think that patient has bladder wall abscess but thinks that CT findings suggest bladder diverticula.  Urology recommends outpatient follow-up with urology. -Blood cultures negative so far -Hemodynamically improving and currently afebrile  AKI on CKD stage IIIb -Creatinine 2.77 on presentation.  Much improved to 1.71 on 10/16/2022.  Treated with IV fluids.  Creatinine 1.48 today.  Monitor intermittently.  Nausea and vomiting -Resolved  Leukocytosis -Resolved  Anemia of chronic disease Hematuria -Hemoglobin currently stable.  Eliquis on hold.  Resume Eliquis on discharge if hemoglobin remains stable.  Chronic thrombocytopenia -Questionable cause.   Monitor intermittently.  Chronic diastolic CHF -Echo in 03/4033 had shown EF of 50 to 55%.  Currently compensated.  Strict input and output.  Daily weights.  Lasix on hold.  Outpatient follow-up with cardiology  Persistent A-fib/SSS status post pacemaker -Eliquis on hold as above due to gross hematuria after Foley exchange. -Currently controlled.  Outpatient follow-up with cardiology  Chronic pain syndrome--continue Cymbalta  Hypothyroidism -Continue levothyroxine  Osteoarthritis-continue home prednisone  AAA - 5.1 cm ascending thoracic aortic aneurysm noted last on CT on 08/29/2022. -Continue outpatient monitoring  Physical deconditioning -PT recommended home with PT.  Family not willing to take him home with a midline since patient has intermittent confusion and patient might end up pulling the midline.  Family wants the patient to stay in the hospital to complete antibiotic treatment.  Goals of care -Palliative care evaluation appreciated.  Outpatient follow-up with palliative care  DVT prophylaxis: SCDs Code Status: DNR Family Communication: Son and wife on phone Disposition Plan: Status is: Inpatient Remains inpatient appropriate because: Of severity of illness.  Medically stable for discharge on IV antibiotics to home.  See discussion above.    Consultants: ID/urology  Procedures: None  Antimicrobials: Meropenem from 10/13/2022 onwards   Subjective: Patient seen and examined at bedside.  Awake, poor historian.  No agitation, fever, vomiting or seizures reported.   Objective: Vitals:   10/17/22 0759 10/17/22 1645 10/17/22 2200 10/18/22 0519  BP: 122/70 116/66 126/76 136/75  Pulse: 69 71 70 64  Resp: '17 18 18 18  '$ Temp: 97.6 F (36.4 C) 97.6 F (36.4 C) 98 F (36.7 C) 97.6 F (36.4 C)  TempSrc: Oral Oral Oral   SpO2: 94% 96% 96% 94%  Weight:    86.9  kg  Height:        Intake/Output Summary (Last 24 hours) at 10/18/2022 0748 Last data filed at 10/18/2022  0521 Gross per 24 hour  Intake 710 ml  Output 1475 ml  Net -765 ml    Filed Weights   10/15/22 0626 10/16/22 0500 10/18/22 0519  Weight: 81.2 kg 88.2 kg 86.9 kg    Examination:  General: On room air.  No distress.  Looks chronically ill and deconditioned.  Slow to respond.  Extremely poor historian.  Flat affect.  Slightly confused. respiratory: Decreased breath sounds at bases bilaterally with some crackles CVS: Currently rate controlled; S1-S2 heard  abdominal: Soft, nontender, slightly distended, no organomegaly; normal bowel sounds are heard  extremities: Trace lower extremity edema; no clubbing.   Genitourinary: Foley catheter present   Data Reviewed: I have personally reviewed following labs and imaging studies  CBC: Recent Labs  Lab 10/13/22 1356 10/14/22 0500 10/15/22 0317 10/16/22 0232 10/17/22 0445 10/18/22 0421  WBC 29.3* 17.7* 12.0* 9.7 6.5 5.8  NEUTROABS 23.8*  --   --   --  4.4 3.3  HGB 11.8* 10.2* 10.1* 10.6* 9.8* 10.0*  HCT 35.7* 30.0* 29.3* 30.4* 28.5* 29.7*  MCV 98.6 97.1 95.1 94.1 96.0 96.1  PLT 80* 50* 65* 64* 63* 74*    Basic Metabolic Panel: Recent Labs  Lab 10/13/22 1356 10/14/22 0500 10/15/22 0317 10/16/22 0232 10/18/22 0421  NA 135 137 135 137 139  K 3.9 3.9 3.9 3.7 3.7  CL 101 104 102 105 107  CO2 21* 21* '25 23 25  '$ GLUCOSE 110* 94 111* 103* 96  BUN 47* 45* 44* 40* 33*  CREATININE 2.77* 2.28* 1.99* 1.71* 1.48*  CALCIUM 8.8* 8.6* 8.8* 9.1 8.8*  MG  --   --  2.2 2.2 2.1  PHOS  --   --  2.9 2.6  --     GFR: Estimated Creatinine Clearance: 32.6 mL/min (A) (by C-G formula based on SCr of 1.48 mg/dL (H)). Liver Function Tests: Recent Labs  Lab 10/13/22 1356 10/15/22 0317 10/16/22 0232  AST 24 19  --   ALT 24 24  --   ALKPHOS 69 75  --   BILITOT 0.8 0.8  --   PROT 6.7 5.8*  --   ALBUMIN 3.3* 2.7* 2.7*    No results for input(s): "LIPASE", "AMYLASE" in the last 168 hours. No results for input(s): "AMMONIA" in the last 168  hours. Coagulation Profile: Recent Labs  Lab 10/13/22 1356  INR 1.1    Cardiac Enzymes: No results for input(s): "CKTOTAL", "CKMB", "CKMBINDEX", "TROPONINI" in the last 168 hours. BNP (last 3 results) No results for input(s): "PROBNP" in the last 8760 hours. HbA1C: No results for input(s): "HGBA1C" in the last 72 hours. CBG: No results for input(s): "GLUCAP" in the last 168 hours. Lipid Profile: No results for input(s): "CHOL", "HDL", "LDLCALC", "TRIG", "CHOLHDL", "LDLDIRECT" in the last 72 hours. Thyroid Function Tests: No results for input(s): "TSH", "T4TOTAL", "FREET4", "T3FREE", "THYROIDAB" in the last 72 hours. Anemia Panel: No results for input(s): "VITAMINB12", "FOLATE", "FERRITIN", "TIBC", "IRON", "RETICCTPCT" in the last 72 hours. Sepsis Labs: Recent Labs  Lab 10/13/22 1356 10/13/22 1531  LATICACIDVEN 2.4* 1.8     Recent Results (from the past 240 hour(s))  Blood Culture (routine x 2)     Status: None (Preliminary result)   Collection Time: 10/13/22  1:56 PM   Specimen: BLOOD  Result Value Ref Range Status   Specimen Description BLOOD LEFT ANTECUBITAL  Final   Special Requests   Final    BOTTLES DRAWN AEROBIC AND ANAEROBIC Blood Culture adequate volume   Culture   Final    NO GROWTH 4 DAYS Performed at [facility], [address] [place], [place] [address]    Report Status PENDING  Incomplete  Urine Culture     Status: Abnormal   Collection Time: 10/13/22  2:30 PM   Specimen: In/Out Cath Urine  Result Value Ref Range Status   Specimen Description IN/OUT CATH URINE  Final   Special Requests   Final    NONE Performed at [facility], [place] [address], [place], [place] [address]    Culture MULTIPLE SPECIES PRESENT, SUGGEST RECOLLECTION (A)  Final   Report Status 10/14/2022 FINAL  Final  Blood Culture (routine x 2)     Status: None (Preliminary result)   Collection Time: 10/13/22  2:35 PM   Specimen: BLOOD  Result Value Ref Range Status    Specimen Description BLOOD LEFT  Final   Special Requests   Final    BOTTLES DRAWN AEROBIC ONLY Blood Culture results may not be optimal due to an inadequate volume of blood received in culture bottles   Culture   Final    NO GROWTH 4 DAYS Performed at [facility], [place] [address], [place], [place] [address]    Report Status PENDING  Incomplete  Remove and replace urinary cath (placed > 5 days) then obtain urine culture from new indwelling urinary catheter.     Status: None   Collection Time: 10/14/22 12:40 PM   Specimen: Urine, Catheterized  Result Value Ref Range Status   Specimen Description URINE, CATHETERIZED  Final   Special Requests NONE  Final   Culture   Final    NO GROWTH Performed at [facility], [address], [place], [place] [address]    Report Status 10/16/2022 FINAL  Final         Radiology Studies: No results found.      Scheduled Meds:  Chlorhexidine Gluconate Cloth  6 each Topical Daily   DULoxetine  60 mg Oral Daily   levothyroxine  25 mcg Oral QAC breakfast   loratadine  10 mg Oral Daily   midodrine  2.5 mg Oral TID WC   pantoprazole  40 mg Oral Daily   predniSONE  5 mg Oral Daily   pregabalin  50 mg Oral QHS   sodium chloride flush  10-40 mL Intracatheter Q12H   sodium chloride flush  3 mL Intravenous Q12H   topiramate  50 mg Oral BID   traMADol  50 mg Oral QHS   Continuous Infusions:  ertapenem 500 mg (10/18/22 0530)          Aline August, MD [facility] 10/18/2022, 7:48 AM

## 2022-10-18 NOTE — Progress Notes (Signed)
   Palliative Medicine Inpatient Follow Up Note HPI:  88 year old M with PMH of diastolic CHF, A-fib on Eliquis, SSS/PPM, CVA, CKD-3B, anemia, AAA, chronic Foley, OA on chronic prednisone and right eye vision loss presented with fever, vomiting and admitted for severe sepsis due to complicated UTI in the setting of chronic Foley catheter, AKI and hypotension. On IV antibiotics. ID involved. Palliative care was asked to see to discuss goals of care in the setting of recurrent hospitalizations and multiple chronic comorbid conditions.   Douglas Walker was prior followed by [facility] palliative care though had been discharged from services due to clinical stability.  Today's Discussion 10/18/2022  *Please note that this is a verbal dictation therefore any spelling or grammatical errors are due to the "[place] [employer]" system interpretation.  Chart reviewed inclusive of vital signs, progress notes, laboratory results, and diagnostic images.   I met with Douglas Walker at bedside this morning.  He is resting in bed though easily arousable.  We discussed the plan in the setting of his urinary infection and the need for continued antibiotics.  He is oriented this morning to person and place.  He vocalizes understanding of the plan. Created space and opportunity for patient to explore thoughts feelings and fears regarding current medical situation.  He endorses his main concern this morning is having assistance with eating given his visual impairment.  I was able to meet with the nursing technician and relayed this information.  Patients personal goals are to go home. He is agreeable to mobility.  Questions and concerns addressed/Palliative Support Provided.   Objective Assessment: Vital Signs Vitals:   10/17/22 2200 10/18/22 0519  BP: 126/76 136/75  Pulse: 70 64  Resp: 18 18  Temp: 98 F (36.7 C) 97.6 F (36.4 C)  SpO2: 96% 94%    Intake/Output Summary (Last 24 hours) at 10/18/2022 0745 Last data  filed at 10/18/2022 1610 Gross per 24 hour  Intake 710 ml  Output 1475 ml  Net -765 ml   Last Weight  Most recent update: 10/18/2022  5:21 AM    Weight  86.9 kg (191 lb 9.3 oz)            Gen: Elder Caucasian male in no acute distress HEENT: moist mucous membranes CV: Regular rate and rhythm PULM: On room air breathing is even and nonlabored ABD: soft/nontender EXT: No edema Neuro: Alert and oriented x3 -hard of hearing delayed responses  SUMMARY OF RECOMMENDATIONS   DNAR/DNI   MOST/DNR Form Completed, paper copy placed onto the chart electric copy can be found in [facility]   Patient's son would prefer if patient either goes to SNF for remains in the hospital until completion of IV antibiotics --> Plan for completion on Tuesday   OP Palliative support on discharge --> no longer meets criteria for [facility] have made a referral to [facility] will continue to check in incrementally  Billing based on MDM: Moderate ______________________________________________________________________________________ [place] Team Team Cell Phone: [phone] Please utilize secure chat with additional questions, if there is no response within 30 minutes please call the above phone number  Palliative Medicine Team providers are available by phone from 7am to 7pm daily and can be reached through the team cell phone.  Should this patient require assistance outside of these hours, please call the patient's attending physician.

## 2022-10-19 DIAGNOSIS — I5032 Chronic diastolic (congestive) heart failure: Secondary | ICD-10-CM | POA: Diagnosis not present

## 2022-10-19 DIAGNOSIS — N390 Urinary tract infection, site not specified: Secondary | ICD-10-CM | POA: Diagnosis not present

## 2022-10-19 DIAGNOSIS — N179 Acute kidney failure, unspecified: Secondary | ICD-10-CM | POA: Diagnosis not present

## 2022-10-19 DIAGNOSIS — Z515 Encounter for palliative care: Secondary | ICD-10-CM | POA: Diagnosis not present

## 2022-10-19 DIAGNOSIS — A419 Sepsis, unspecified organism: Secondary | ICD-10-CM | POA: Diagnosis not present

## 2022-10-19 LAB — BASIC METABOLIC PANEL
Anion gap: 9 (ref 5–15)
BUN: 29 mg/dL — ABNORMAL HIGH (ref 8–23)
CO2: 20 mmol/L — ABNORMAL LOW (ref 22–32)
Calcium: 8 mg/dL — ABNORMAL LOW (ref 8.9–10.3)
Chloride: 112 mmol/L — ABNORMAL HIGH (ref 98–111)
Creatinine, Ser: 1.2 mg/dL (ref 0.61–1.24)
GFR, Estimated: 56 mL/min — ABNORMAL LOW (ref >60)
Glucose, Bld: 81 mg/dL (ref 70–99)
Potassium: 3.5 mmol/L (ref 3.5–5.1)
Sodium: 141 mmol/L (ref 135–145)

## 2022-10-19 MED ORDER — SODIUM CHLORIDE 0.9 % IV SOLN
1.0000 g | INTRAVENOUS | Status: AC
  Administered 2022-10-19 – 2022-10-22 (×4): 1000 mg via INTRAVENOUS
  Filled 2022-10-19 (×4): qty 1

## 2022-10-19 NOTE — Progress Notes (Signed)
84.7 PROGRESS NOTE    Douglas Walker  WUJ:[id number] DOB: 1925-03-30 DOA: 10/13/2022 PCP: Corliss Blacker, MD   Brief Narrative:  88 year old M with PMH of diastolic CHF, A-fib on Eliquis, SSS/PPM, CVA, CKD-3B, anemia, AAA, chronic Foley, OA on chronic prednisone and right eye vision loss presented with fever, vomiting and admitted for severe sepsis due to complicated UTI in the setting of chronic Foley catheter, AKI and hypotension.  He was started on broad-spectrum antibiotics.  ID was consulted.  Foley catheter was exchanged the next day.  Subsequently, urology was added for abnormal CT of the abdomen and pelvis.  Assessment & Plan:   Severe sepsis: Present on admission Complicated UTI/catheter associated UTI in a patient with chronic Foley catheter: Present on admission -Currently on ertapenem.  ID recommended to complete 10 days of antibiotics with end date being 10/22/2022.  Midline catheter placed on 10/16/2022.  Outpatient follow-up with ID.   -Foley catheter has already been exchanged on 10/14/2022.  Repeat urine culture after Foley exchange was negative.  Initial urine culture from prior Foley grew multiple species -CT of the abdomen and pelvis concerning for?  Bladder abscess.  I urology evaluation appreciated: Urology does not think that patient has bladder wall abscess but thinks that CT findings suggest bladder diverticula.  Urology recommends outpatient follow-up with urology. -Blood cultures negative so far -Hemodynamically improving and currently afebrile  AKI on CKD stage IIIb -Creatinine 2.77 on presentation.  Much improved to 1.71 on 10/16/2022.  Treated with IV fluids.  Creatinine 1.20 today.  Monitor intermittently.  Nausea and vomiting -Resolved  Leukocytosis -Resolved  Anemia of chronic disease Hematuria -Hemoglobin currently stable.  Eliquis on hold.  Resume Eliquis on discharge if hemoglobin remains stable.  Chronic thrombocytopenia -Questionable  cause.  Monitor intermittently.  Chronic diastolic CHF -Echo in 08/5620 had shown EF of 50 to 55%.  Currently compensated.  Strict input and output.  Daily weights.  Lasix on hold.  Outpatient follow-up with cardiology  Persistent A-fib/SSS status post pacemaker -Eliquis on hold as above due to gross hematuria after Foley exchange. -Currently controlled.  Outpatient follow-up with cardiology  Chronic pain syndrome--continue Cymbalta  Hypothyroidism -Continue levothyroxine  Osteoarthritis-continue home prednisone  AAA - 5.1 cm ascending thoracic aortic aneurysm noted last on CT on 08/29/2022. -Continue outpatient monitoring  Physical deconditioning -PT recommended home with PT.  Family not willing to take him home with a midline since patient has intermittent confusion and patient might end up pulling the midline.  Family wants the patient to stay in the hospital to complete antibiotic treatment.  Possible dementia -Possible undiagnosed dementia.  Has had episodes of confusion.  Fall precautions.  Outpatient follow-up.  Goals of care -Palliative care evaluation appreciated.  Outpatient follow-up with palliative care  DVT prophylaxis: SCDs Code Status: DNR Family Communication: Son and wife on phone on 10/18/2022 Disposition Plan: Status is: Inpatient Remains inpatient appropriate because: Of severity of illness.  Medically stable for discharge on IV antibiotics to home.  See discussion above.    Consultants: ID/urology  Procedures: None  Antimicrobials: Meropenem from 10/13/2022 onwards   Subjective: Patient seen and examined at bedside.  Awake, poor historian.  No seizures, agitation, fever reported.   Objective: Vitals:   10/18/22 2036 10/18/22 2200 10/19/22 0500 10/19/22 0547  BP: 134/79   (!) 140/76  Pulse: 70   70  Resp: 18   17  Temp: 98.3 F (36.8 C)   98.4 F (36.9 C)  TempSrc:  SpO2: 95%   94%  Weight:  88.9 kg 88.9 kg   Height:         Intake/Output Summary (Last 24 hours) at 10/19/2022 0813 Last data filed at 10/19/2022 0543 Gross per 24 hour  Intake 360 ml  Output 1700 ml  Net -1340 ml    Filed Weights   10/18/22 0519 10/18/22 2200 10/19/22 0500  Weight: 86.9 kg 88.9 kg 88.9 kg    Examination:  General: No acute distress.  Still on room air.  Looks chronically ill and deconditioned.  Slow to respond.  Extremely poor historian.  Flat affect.  Still slightly confused. respiratory: Bilateral decreased breath sounds at bases with scattered crackles CVS: Mostly rate controlled; S1 and S2 are heard  abdominal: Soft, nontender, still has some distention; no organomegaly; bowel sounds heard normally extremities: No cyanosis; bilateral lower extremity edema present. Genitourinary: Still has a Foley catheter  Data Reviewed: I have personally reviewed following labs and imaging studies  CBC: Recent Labs  Lab 10/13/22 1356 10/14/22 0500 10/15/22 0317 10/16/22 0232 10/17/22 0445 10/18/22 0421  WBC 29.3* 17.7* 12.0* 9.7 6.5 5.8  NEUTROABS 23.8*  --   --   --  4.4 3.3  HGB 11.8* 10.2* 10.1* 10.6* 9.8* 10.0*  HCT 35.7* 30.0* 29.3* 30.4* 28.5* 29.7*  MCV 98.6 97.1 95.1 94.1 96.0 96.1  PLT 80* 50* 65* 64* 63* 74*    Basic Metabolic Panel: Recent Labs  Lab 10/14/22 0500 10/15/22 0317 10/16/22 0232 10/18/22 0421 10/19/22 0111  NA 137 135 137 139 141  K 3.9 3.9 3.7 3.7 3.5  CL 104 102 105 107 112*  CO2 21* '25 23 25 '$ 20*  GLUCOSE 94 111* 103* 96 81  BUN 45* 44* 40* 33* 29*  CREATININE 2.28* 1.99* 1.71* 1.48* 1.20  CALCIUM 8.6* 8.8* 9.1 8.8* 8.0*  MG  --  2.2 2.2 2.1  --   PHOS  --  2.9 2.6  --   --     GFR: Estimated Creatinine Clearance: 40.6 mL/min (by C-G formula based on SCr of 1.2 mg/dL). Liver Function Tests: Recent Labs  Lab 10/13/22 1356 10/15/22 0317 10/16/22 0232  AST 24 19  --   ALT 24 24  --   ALKPHOS 69 75  --   BILITOT 0.8 0.8  --   PROT 6.7 5.8*  --   ALBUMIN 3.3* 2.7* 2.7*     No results for input(s): "LIPASE", "AMYLASE" in the last 168 hours. No results for input(s): "AMMONIA" in the last 168 hours. Coagulation Profile: Recent Labs  Lab 10/13/22 1356  INR 1.1    Cardiac Enzymes: No results for input(s): "CKTOTAL", "CKMB", "CKMBINDEX", "TROPONINI" in the last 168 hours. BNP (last 3 results) No results for input(s): "PROBNP" in the last 8760 hours. HbA1C: No results for input(s): "HGBA1C" in the last 72 hours. CBG: No results for input(s): "GLUCAP" in the last 168 hours. Lipid Profile: No results for input(s): "CHOL", "HDL", "LDLCALC", "TRIG", "CHOLHDL", "LDLDIRECT" in the last 72 hours. Thyroid Function Tests: No results for input(s): "TSH", "T4TOTAL", "FREET4", "T3FREE", "THYROIDAB" in the last 72 hours. Anemia Panel: No results for input(s): "VITAMINB12", "FOLATE", "FERRITIN", "TIBC", "IRON", "RETICCTPCT" in the last 72 hours. Sepsis Labs: Recent Labs  Lab 10/13/22 1356 10/13/22 1531  LATICACIDVEN 2.4* 1.8     Recent Results (from the past 240 hour(s))  Blood Culture (routine x 2)     Status: None   Collection Time: 10/13/22  1:56 PM   Specimen:  BLOOD  Result Value Ref Range Status   Specimen Description BLOOD LEFT ANTECUBITAL  Final   Special Requests   Final    BOTTLES DRAWN AEROBIC AND ANAEROBIC Blood Culture adequate volume   Culture   Final    NO GROWTH 5 DAYS Performed at [facility], [address], [place], [place] [address]    Report Status 10/18/2022 FINAL  Final  Urine Culture     Status: Abnormal   Collection Time: 10/13/22  2:30 PM   Specimen: In/Out Cath Urine  Result Value Ref Range Status   Specimen Description IN/OUT CATH URINE  Final   Special Requests   Final    NONE Performed at [facility], [place] [address], [place], [place] [address]    Culture MULTIPLE SPECIES PRESENT, SUGGEST RECOLLECTION (A)  Final   Report Status 10/14/2022 FINAL  Final  Blood Culture (routine x 2)     Status: None    Collection Time: 10/13/22  2:35 PM   Specimen: BLOOD  Result Value Ref Range Status   Specimen Description BLOOD LEFT  Final   Special Requests   Final    BOTTLES DRAWN AEROBIC ONLY Blood Culture results may not be optimal due to an inadequate volume of blood received in culture bottles   Culture   Final    NO GROWTH 5 DAYS Performed at [facility], [place] [address], [place], [place] [address]    Report Status 10/18/2022 FINAL  Final  Remove and replace urinary cath (placed > 5 days) then obtain urine culture from new indwelling urinary catheter.     Status: None   Collection Time: 10/14/22 12:40 PM   Specimen: Urine, Catheterized  Result Value Ref Range Status   Specimen Description URINE, CATHETERIZED  Final   Special Requests NONE  Final   Culture   Final    NO GROWTH Performed at [facility], [address] [place], [place] [address]    Report Status 10/16/2022 FINAL  Final         Radiology Studies: No results found.      Scheduled Meds:  Chlorhexidine Gluconate Cloth  6 each Topical Daily   DULoxetine  60 mg Oral Daily   levothyroxine  25 mcg Oral QAC breakfast   loratadine  10 mg Oral Daily   midodrine  2.5 mg Oral TID WC   pantoprazole  40 mg Oral Daily   predniSONE  5 mg Oral Daily   pregabalin  50 mg Oral QHS   sodium chloride flush  10-40 mL Intracatheter Q12H   sodium chloride flush  3 mL Intravenous Q12H   topiramate  50 mg Oral BID   traMADol  50 mg Oral QHS   Continuous Infusions:  ertapenem            Aline August, MD [facility] 10/19/2022, 8:13 AM

## 2022-10-19 NOTE — Progress Notes (Signed)
PHARMACY NOTE:  ANTIMICROBIAL RENAL DOSAGE ADJUSTMENT  Current antimicrobial regimen includes a mismatch between antimicrobial dosage and estimated renal function.  As per policy approved by the Pharmacy & Therapeutics and Medical Executive Committees, the antimicrobial dosage will be adjusted accordingly.  Current antimicrobial dosage:  ertapenem '500mg'$  IV q24 hours  Indication: ESBL infection  Renal Function:  Estimated Creatinine Clearance: 40.6 mL/min (by C-G formula based on SCr of 1.2 mg/dL). '[]'$      On intermittent HD, scheduled: '[]'$      On CRRT    Antimicrobial dosage has been changed to:  ertapenem '1000mg'$  IV q24 hours  Additional comments:   Thank you for allowing pharmacy to be a part of this patient's care.  Dimple Nanas, PharmD, BCPS 10/19/2022 7:08 AM

## 2022-10-19 NOTE — Progress Notes (Signed)
   Palliative Medicine Inpatient Follow Up Note HPI:  88 year old M with PMH of diastolic CHF, A-fib on Eliquis, SSS/PPM, CVA, CKD-3B, anemia, AAA, chronic Foley, OA on chronic prednisone and right eye vision loss presented with fever, vomiting and admitted for severe sepsis due to complicated UTI in the setting of chronic Foley catheter, AKI and hypotension. On IV antibiotics. ID involved. Palliative care was asked to see to discuss goals of care in the setting of recurrent hospitalizations and multiple chronic comorbid conditions.   Douglas Walker was prior followed by [facility] palliative care though had been discharged from services due to clinical stability.  Today's Discussion 10/19/2022  *Please note that this is a verbal dictation therefore any spelling or grammatical errors are due to the "[employer]" system interpretation.  Chart reviewed inclusive of vital signs, progress notes, laboratory results, and diagnostic images.   I met with Douglas Walker at bedside this morning. He was in good spirits and expressed that he was "trying to rest some". He denied pain, shortness of breath, or nausea this morning.  A brief review of the plan was held at bedside.   Offered to get Douglas Walker up though he declined stating he would mobilize later.  Reviewed the plan for continued antibiotics.   Questions and concerns addressed/Palliative Support Provided.   Objective Assessment: Vital Signs Vitals:   10/19/22 0547 10/19/22 1006  BP: (!) 140/76 135/72  Pulse: 70 70  Resp: 17 16  Temp: 98.4 F (36.9 C) 98.2 F (36.8 C)  SpO2: 94%     Intake/Output Summary (Last 24 hours) at 10/19/2022 1644 Last data filed at 10/19/2022 1300 Gross per 24 hour  Intake 360 ml  Output 1300 ml  Net -940 ml    Last Weight  Most recent update: 10/19/2022  6:13 AM    Weight  88.9 kg (195 lb 15.8 oz)            Gen: Elder Caucasian male in no acute distress HEENT: moist mucous membranes CV: Regular rate  and rhythm PULM: On room air breathing is even and nonlabored ABD: soft/nontender EXT: No edema Neuro: Alert and oriented x3 - hard of hearing delayed responses  SUMMARY OF RECOMMENDATIONS   DNAR/DNI   MOST/DNR Form Completed, paper copy placed onto the chart electric copy can be found in [facility]   Patient's son would prefer if patient either goes to SNF for remains in the hospital until completion of IV antibiotics --> Plan for completion on Tuesday   OP Palliative support on discharge --> no longer meets criteria for [facility] have made a referral to [facility] will continue to check in incrementally  Billing based on MDM: Moderate ______________________________________________________________________________________ [facility] Team Team Cell Phone: [phone] Please utilize secure chat with additional questions, if there is no response within 30 minutes please call the above phone number  Palliative Medicine Team providers are available by phone from 7am to 7pm daily and can be reached through the team cell phone.  Should this patient require assistance outside of these hours, please call the patient's attending physician.

## 2022-10-20 DIAGNOSIS — Z7901 Long term (current) use of anticoagulants: Secondary | ICD-10-CM | POA: Diagnosis not present

## 2022-10-20 DIAGNOSIS — Z515 Encounter for palliative care: Secondary | ICD-10-CM | POA: Diagnosis not present

## 2022-10-20 DIAGNOSIS — A419 Sepsis, unspecified organism: Secondary | ICD-10-CM | POA: Diagnosis not present

## 2022-10-20 DIAGNOSIS — N179 Acute kidney failure, unspecified: Secondary | ICD-10-CM | POA: Diagnosis not present

## 2022-10-20 DIAGNOSIS — I5032 Chronic diastolic (congestive) heart failure: Secondary | ICD-10-CM | POA: Diagnosis not present

## 2022-10-20 NOTE — Progress Notes (Signed)
84.7 PROGRESS NOTE    Douglas Walker  DVV:[id number] DOB: 03-11-1925 DOA: 10/13/2022 PCP: Corliss Blacker, MD   Brief Narrative:  88 year old M with PMH of diastolic CHF, A-fib on Eliquis, SSS/PPM, CVA, CKD-3B, anemia, AAA, chronic Foley, OA on chronic prednisone and right eye vision loss presented with fever, vomiting and admitted for severe sepsis due to complicated UTI in the setting of chronic Foley catheter, AKI and hypotension.  He was started on broad-spectrum antibiotics.  ID was consulted.  Foley catheter was exchanged the next day.  Subsequently, urology was consulted for abnormal CT of the abdomen and pelvis.  ID recommended to continue IV antibiotics till 10/22/2022.  Assessment & Plan:   Severe sepsis: Present on admission Complicated UTI/catheter associated UTI in a patient with chronic Foley catheter: Present on admission -Currently on ertapenem.  ID recommended to complete 10 days of antibiotics with end date being 10/22/2022.  Midline catheter placed on 10/16/2022.  Outpatient follow-up with ID.   -Foley catheter has already been exchanged on 10/14/2022.  Repeat urine culture after Foley exchange was negative.  Initial urine culture from prior Foley grew multiple species -CT of the abdomen and pelvis concerning for?  Bladder abscess.  I urology evaluation appreciated: Urology does not think that patient has bladder wall abscess but thinks that CT findings suggest bladder diverticula.  Urology recommends outpatient follow-up with urology. -Blood cultures negative so far -Hemodynamically improving and currently afebrile  AKI on CKD stage IIIb -Creatinine 2.77 on presentation.  Much improved to 1.71 on 10/16/2022.  Treated with IV fluids.  Creatinine 1.20 on 10/19/2022.  Monitor intermittently.  Nausea and vomiting -Resolved  Leukocytosis -Resolved  Anemia of chronic disease Hematuria -Hemoglobin currently stable.  Eliquis on hold.  Resume Eliquis on discharge if  hemoglobin remains stable.  Chronic thrombocytopenia -Questionable cause.  Monitor intermittently.  Chronic diastolic CHF -Echo in 05/2693 had shown EF of 50 to 55%.  Currently compensated.  Strict input and output.  Daily weights.  Lasix on hold.  Outpatient follow-up with cardiology  Persistent A-fib/SSS status post pacemaker -Eliquis on hold as above due to gross hematuria after Foley exchange. -Currently controlled.  Outpatient follow-up with cardiology  Chronic pain syndrome--continue Cymbalta  Hypothyroidism -Continue levothyroxine  Osteoarthritis-continue home prednisone  AAA - 5.1 cm ascending thoracic aortic aneurysm noted last on CT on 08/29/2022. -Continue outpatient monitoring  Physical deconditioning -PT recommended home with PT.  Family not willing to take him home with a midline since patient has intermittent confusion and patient might end up pulling the midline.  Family wants the patient to stay in the hospital to complete antibiotic treatment.  Possible dementia -Possible undiagnosed dementia.  Has had episodes of confusion.  Fall precautions.  Outpatient follow-up.  Goals of care -Palliative care following.  Outpatient follow-up with palliative care  DVT prophylaxis: SCDs Code Status: DNR Family Communication: Son and wife on phone on 10/18/2022 Disposition Plan: Status is: Inpatient Remains inpatient appropriate because: Of severity of illness.  Medically stable for discharge on IV antibiotics to home.  See discussion above.    Consultants: ID/urology  Procedures: None  Antimicrobials:  Anti-infectives (From admission, onward)    Start     Dose/Rate Route Frequency Ordered Stop   10/19/22 1000  ertapenem (INVANZ) 1,000 mg in sodium chloride 0.9 % 100 mL IVPB        1 g 200 mL/hr over 30 Minutes Intravenous Every 24 hours 10/19/22 0708 10/23/22 0959   10/17/22 0500  ertapenem (  INVANZ) 500 mg in sodium chloride 0.9 % 50 mL IVPB  Status:   Discontinued        500 mg 100 mL/hr over 30 Minutes Intravenous Every 24 hours 10/16/22 1440 10/19/22 0708   10/14/22 0500  meropenem (MERREM) 500 mg in sodium chloride 0.9 % 100 mL IVPB        500 mg 200 mL/hr over 30 Minutes Intravenous Every 12 hours 10/13/22 1625 10/16/22 1803   10/13/22 1500  meropenem (MERREM) 1 g in sodium chloride 0.9 % 100 mL IVPB        1 g 200 mL/hr over 30 Minutes Intravenous  Once 10/13/22 1448 10/13/22 1613        Subjective: Patient seen and examined at bedside.  Awake, poor historian.  No fever, agitation, seizures or vomiting reported.   Objective: Vitals:   10/19/22 1006 10/19/22 2053 10/20/22 0454 10/20/22 0500  BP: 135/72 126/73 (!) 150/84   Pulse: 70 70 70   Resp: '16 18 18   '$ Temp: 98.2 F (36.8 C) 98.2 F (36.8 C) 98.4 F (36.9 C)   TempSrc: Oral     SpO2:  96% 90%   Weight:    88.6 kg  Height:        Intake/Output Summary (Last 24 hours) at 10/20/2022 0744 Last data filed at 10/20/2022 0555 Gross per 24 hour  Intake 940 ml  Output 2750 ml  Net -1810 ml    Filed Weights   10/18/22 2200 10/19/22 0500 10/20/22 0500  Weight: 88.9 kg 88.9 kg 88.6 kg    Examination:  General: On room air.  No distress currently.  Looks chronically ill and deconditioned.  Slow to respond.  Extremely poor historian.  Flat affect.  Still slightly confused. respiratory: Decreased breath sounds at bases bilaterally with some crackles CVS: S1-S2 heard; rate controlled mostly abdominal: Soft, nontender, distended mildly, no organomegaly; normal bowel sounds heard  extremities: Mild lower extremity edema bilaterally; no clubbing Genitourinary: Foley catheter present  Data Reviewed: I have personally reviewed following labs and imaging studies  CBC: Recent Labs  Lab 10/13/22 1356 10/14/22 0500 10/15/22 0317 10/16/22 0232 10/17/22 0445 10/18/22 0421  WBC 29.3* 17.7* 12.0* 9.7 6.5 5.8  NEUTROABS 23.8*  --   --   --  4.4 3.3  HGB 11.8* 10.2*  10.1* 10.6* 9.8* 10.0*  HCT 35.7* 30.0* 29.3* 30.4* 28.5* 29.7*  MCV 98.6 97.1 95.1 94.1 96.0 96.1  PLT 80* 50* 65* 64* 63* 74*    Basic Metabolic Panel: Recent Labs  Lab 10/14/22 0500 10/15/22 0317 10/16/22 0232 10/18/22 0421 10/19/22 0111  NA 137 135 137 139 141  K 3.9 3.9 3.7 3.7 3.5  CL 104 102 105 107 112*  CO2 21* '25 23 25 '$ 20*  GLUCOSE 94 111* 103* 96 81  BUN 45* 44* 40* 33* 29*  CREATININE 2.28* 1.99* 1.71* 1.48* 1.20  CALCIUM 8.6* 8.8* 9.1 8.8* 8.0*  MG  --  2.2 2.2 2.1  --   PHOS  --  2.9 2.6  --   --     GFR: Estimated Creatinine Clearance: 40.6 mL/min (by C-G formula based on SCr of 1.2 mg/dL). Liver Function Tests: Recent Labs  Lab 10/13/22 1356 10/15/22 0317 10/16/22 0232  AST 24 19  --   ALT 24 24  --   ALKPHOS 69 75  --   BILITOT 0.8 0.8  --   PROT 6.7 5.8*  --   ALBUMIN 3.3* 2.7* 2.7*  No results for input(s): "LIPASE", "AMYLASE" in the last 168 hours. No results for input(s): "AMMONIA" in the last 168 hours. Coagulation Profile: Recent Labs  Lab 10/13/22 1356  INR 1.1    Cardiac Enzymes: No results for input(s): "CKTOTAL", "CKMB", "CKMBINDEX", "TROPONINI" in the last 168 hours. BNP (last 3 results) No results for input(s): "PROBNP" in the last 8760 hours. HbA1C: No results for input(s): "HGBA1C" in the last 72 hours. CBG: No results for input(s): "GLUCAP" in the last 168 hours. Lipid Profile: No results for input(s): "CHOL", "HDL", "LDLCALC", "TRIG", "CHOLHDL", "LDLDIRECT" in the last 72 hours. Thyroid Function Tests: No results for input(s): "TSH", "T4TOTAL", "FREET4", "T3FREE", "THYROIDAB" in the last 72 hours. Anemia Panel: No results for input(s): "VITAMINB12", "FOLATE", "FERRITIN", "TIBC", "IRON", "RETICCTPCT" in the last 72 hours. Sepsis Labs: Recent Labs  Lab 10/13/22 1356 10/13/22 1531  LATICACIDVEN 2.4* 1.8     Recent Results (from the past 240 hour(s))  Blood Culture (routine x 2)     Status: None   Collection  Time: 10/13/22  1:56 PM   Specimen: BLOOD  Result Value Ref Range Status   Specimen Description BLOOD LEFT ANTECUBITAL  Final   Special Requests   Final    BOTTLES DRAWN AEROBIC AND ANAEROBIC Blood Culture adequate volume   Culture   Final    NO GROWTH 5 DAYS Performed at [facility], [address], [place], [place] [address]    Report Status 10/18/2022 FINAL  Final  Urine Culture     Status: Abnormal   Collection Time: 10/13/22  2:30 PM   Specimen: In/Out Cath Urine  Result Value Ref Range Status   Specimen Description IN/OUT CATH URINE  Final   Special Requests   Final    NONE Performed at [facility], [place] [address] [place], [place] [address]    Culture MULTIPLE SPECIES PRESENT, SUGGEST RECOLLECTION (A)  Final   Report Status 10/14/2022 FINAL  Final  Blood Culture (routine x 2)     Status: None   Collection Time: 10/13/22  2:35 PM   Specimen: BLOOD  Result Value Ref Range Status   Specimen Description BLOOD LEFT  Final   Special Requests   Final    BOTTLES DRAWN AEROBIC ONLY Blood Culture results may not be optimal due to an inadequate volume of blood received in culture bottles   Culture   Final    NO GROWTH 5 DAYS Performed at [facility], [place] [address], [place], [place] [address]    Report Status 10/18/2022 FINAL  Final  Remove and replace urinary cath (placed > 5 days) then obtain urine culture from new indwelling urinary catheter.     Status: None   Collection Time: 10/14/22 12:40 PM   Specimen: Urine, Catheterized  Result Value Ref Range Status   Specimen Description URINE, CATHETERIZED  Final   Special Requests NONE  Final   Culture   Final    NO GROWTH Performed at [facility], [address], [place], [place] [address]    Report Status 10/16/2022 FINAL  Final         Radiology Studies: No results found.      Scheduled Meds:  Chlorhexidine Gluconate Cloth  6 each Topical Daily   DULoxetine  60 mg Oral Daily    levothyroxine  25 mcg Oral QAC breakfast   loratadine  10 mg Oral Daily   midodrine  2.5 mg Oral TID WC  pantoprazole  40 mg Oral Daily   predniSONE  5 mg Oral Daily   pregabalin  50 mg Oral QHS   sodium chloride flush  10-40 mL Intracatheter Q12H   sodium chloride flush  3 mL Intravenous Q12H   topiramate  50 mg Oral BID   traMADol  50 mg Oral QHS   Continuous Infusions:  ertapenem 1,000 mg (10/19/22 1143)          Aline August, MD [facility] 10/20/2022, 7:44 AM

## 2022-10-20 NOTE — Progress Notes (Signed)
   Palliative Medicine Inpatient Follow Up Note HPI:  88 year old M with PMH of diastolic CHF, A-fib on Eliquis, SSS/PPM, CVA, CKD-3B, anemia, AAA, chronic Foley, OA on chronic prednisone and right eye vision loss presented with fever, vomiting and admitted for severe sepsis due to complicated UTI in the setting of chronic Foley catheter, AKI and hypotension. On IV antibiotics. ID involved. Palliative care was asked to see to discuss goals of care in the setting of recurrent hospitalizations and multiple chronic comorbid conditions.   Douglas Walker was prior followed by [facility] palliative care though had been discharged from services due to clinical stability.  Today's Discussion 10/20/2022  *Please note that this is a verbal dictation therefore any spelling or grammatical errors are due to the "[place] [employer]" system interpretation.  Chart reviewed inclusive of vital signs, progress notes, laboratory results, and diagnostic images.   I met with Douglas Walker at bedside this morning, he was eating his breakfast. Endorses no complaints and is anxious to get home. Reiterated the plan for continued antibiotics and possible discharge per primary team early in the week.   Questions and concerns addressed/Palliative Support Provided.   Objective Assessment: Vital Signs Vitals:   10/20/22 0454 10/20/22 1401  BP: (!) 150/84 133/67  Pulse: 70 71  Resp: 18 16  Temp: 98.4 F (36.9 C) 98.9 F (37.2 C)  SpO2: 90% 96%    Intake/Output Summary (Last 24 hours) at 10/20/2022 1453 Last data filed at 10/20/2022 3212 Gross per 24 hour  Intake 820 ml  Output 2750 ml  Net -1930 ml    Last Weight  Most recent update: 10/20/2022  5:08 AM    Weight  88.6 kg (195 lb 5.2 oz)            Gen: Elder Caucasian male in no acute distress HEENT: moist mucous membranes CV: Regular rate and rhythm PULM: On room air breathing is even and nonlabored ABD: soft/nontender EXT: No edema Neuro: Alert and  oriented x3 - hard of hearing delayed responses  SUMMARY OF RECOMMENDATIONS   DNAR/DNI   MOST/DNR Form Completed   Patient's son would prefer if patient either goes to SNF for remains in the hospital until completion of IV antibiotics --> Plan for completion on Tuesday   OP Palliative support on discharge --> no longer meets criteria for [facility] have made a referral to [facility] will continue to check in incrementally  Billing based on MDM: Low ______________________________________________________________________________________ [place] Team Team Cell Phone: [phone] Please utilize secure chat with additional questions, if there is no response within 30 minutes please call the above phone number  Palliative Medicine Team providers are available by phone from 7am to 7pm daily and can be reached through the team cell phone.  Should this patient require assistance outside of these hours, please call the patient's attending physician.

## 2022-10-20 NOTE — Progress Notes (Signed)
Mobility Specialist Progress Note:   10/20/22 0930  Mobility  Activity Ambulated with assistance in hallway  Level of Assistance Contact guard assist, steadying assist  Assistive Device Front wheel walker  Distance Ambulated (ft) 290 ft  Activity Response Tolerated well  Mobility Referral Yes  $Mobility charge 1 Mobility   Pt agreeable to mobility session. Required only minG assist throughout session. C/o bilat knee pain during ambulation. Pt back in bed with all needs met, bed alarm on.   [facility] Numbers Acute Rehab Secure Chat or Office Phone: [phone]

## 2022-10-21 DIAGNOSIS — R6520 Severe sepsis without septic shock: Secondary | ICD-10-CM | POA: Diagnosis not present

## 2022-10-21 DIAGNOSIS — A419 Sepsis, unspecified organism: Secondary | ICD-10-CM | POA: Diagnosis not present

## 2022-10-21 NOTE — Progress Notes (Addendum)
Mobility Specialist Progress Note:   10/21/22 0915  Mobility  Activity Ambulated with assistance in hallway  Level of Assistance Minimal assist, patient does 75% or more  Assistive Device Front wheel walker  Distance Ambulated (ft) 160 ft  Activity Response Tolerated well  Mobility Referral Yes  $Mobility charge 1 Mobility   Pt agreeable to mobility session despite c/o BLE fatigue/soreness. Required minA at times for navigating in hallway. Pt back in bed with all needs met. Bed alarm on.  Nelta Numbers Acute Rehab Secure Chat or Office Phone: [phone]

## 2022-10-21 NOTE — TOC Progression Note (Signed)
Transition of Care [facility]) - Progression Note    Patient Details  Name: Douglas Walker MRN: [id number] Date of Birth: 05-Dec-1925  Transition of Care [facility] CM/SW Contact  Tom-Johnson, Renea Ee, RN Phone Number: 10/21/2022, 4:36 PM  Clinical Narrative:     CM spoke with patient, son, [name] and daughter in-law, [name] at bedside about discharge disposition. Patient is currently on IV abx and last dose s scheduled for tomorrow 10/22/22.  Home health PT/OT recommended and they states patient had recently used [facility] and request to resume care. CM called in referral to [facility] and acceptance voiced, info on AVS. CM will continue to follow as patient progresses towards discharge.        Expected Discharge Plan and Services                                                 Social Determinants of Health (SDOH) Interventions    Readmission Risk Interventions    10/15/2021   10:43 AM 07/26/2020   10:05 AM  Readmission Risk Prevention Plan  Transportation Screening Complete Complete  PCP or Specialist Appt within 5-7 Days  Not Complete  Not Complete comments  pending disposition  Home Care Screening  Complete  Medication Review (RN CM)  Referral to Pharmacy  Medication Review (RN Care Manager) Complete   PCP or Specialist appointment within 3-5 days of discharge Complete   HRI or Home Care Consult Complete   SW Recovery Care/Counseling Consult Complete   [place] Complete

## 2022-10-21 NOTE — Progress Notes (Signed)
Physical Therapy Treatment Patient Details Name: Douglas Walker MRN: [id number] DOB: 1925-03-25 Today's Date: 10/21/2022   History of Present Illness Pt is a 74y M who presented 10/15 with acute fever, vomiting, rigors. Admitted with severe sepsis d/t complicated UTI in setting of chronic foley catheter. PMHx significant for A-fib, peripheral neuropathy, essential tremor, HTN, CKD stage IIIb, and Hx of falls.    PT Comments    Contintuing to work on functional mobility. Session focused on gait endurance and coordination; Pt experienced loss of balance during R turn with RW likely d/t fatigue and R visual impairments, otherwise stable, ambulating with min A; Pt initially hesitant to participate in session, but responded well to mobility, with a slight decrease in gait speed at end of session. Plan for d/c home remains appropriate, pt making progress towards goals. PT will continue to follow.     Recommendations for follow up therapy are one component of a multi-disciplinary discharge planning process, led by the attending physician.  Recommendations may be updated based on patient status, additional functional criteria and insurance authorization.  Follow Up Recommendations  Home health PT     Assistance Recommended at Discharge Frequent or constant Supervision/Assistance  Patient can return home with the following A little help with walking and/or transfers;Assistance with cooking/housework;Direct supervision/assist for medications management;Direct supervision/assist for financial management;Assist for transportation   Equipment Recommendations  None recommended by PT    Recommendations for Other Services       Precautions / Restrictions Precautions Precautions: Fall Precaution Comments: Very HOH, and legally blind Restrictions Weight Bearing Restrictions: No     Mobility  Bed Mobility Overal bed mobility: Needs Assistance Bed Mobility: Supine to Sit, Sit to Supine      Supine to sit: Supervision Sit to supine: Supervision   General bed mobility comments: HoB flattened, vc for scooting along EoB prior to return to bed, supervision for safety getting up and lying down    Transfers Overall transfer level: Needs assistance Equipment used: Rolling walker (2 wheels) Transfers: Sit to/from Stand Sit to Stand: Supervision           General transfer comment: supervision for safety, vc for hand placment for power up    Ambulation/Gait Ambulation/Gait assistance: Min guard, Min assist Gait Distance (Feet): 250 Feet Assistive device: Rolling walker (2 wheels) Gait Pattern/deviations: Step-through pattern, Decreased stride length, Shuffle       General Gait Details: Early in walk managing well, noted more forward trunk flexion with incr distance, and one loss of balance during turn requireing min assist to regain footing   Stairs             Wheelchair Mobility    Modified Rankin (Stroke Patients Only)       Balance     Sitting balance-Leahy Scale: Good       Standing balance-Leahy Scale: Poor                              Cognition Arousal/Alertness: Awake/alert Behavior During Therapy: WFL for tasks assessed/performed Overall Cognitive Status: History of cognitive impairments - at baseline                                 General Comments: At times slow to answer questions        Exercises      General Comments  Pertinent Vitals/Pain Pain Assessment Pain Assessment: Faces Faces Pain Scale: Hurts little more Pain Location: bil knees Pain Descriptors / Indicators: Grimacing Pain Intervention(s): Monitored during session, Repositioned    Home Living                          Prior Function            PT Goals (current goals can now be found in the care plan section) Acute Rehab PT Goals Patient Stated Goal: agrees to walk PT Goal Formulation: With patient Time For  Goal Achievement: 10/29/22 Potential to Achieve Goals: Good Progress towards PT goals: Progressing toward goals    Frequency    Min 3X/week      PT Plan Current plan remains appropriate    Co-evaluation              AM-PAC PT "6 Clicks" Mobility   Outcome Measure  Help needed turning from your back to your side while in a flat bed without using bedrails?: None Help needed moving from lying on your back to sitting on the side of a flat bed without using bedrails?: None Help needed moving to and from a bed to a chair (including a wheelchair)?: A Little Help needed standing up from a chair using your arms (e.g., wheelchair or bedside chair)?: A Little Help needed to walk in hospital room?: A Little Help needed climbing 3-5 steps with a railing? : A Lot 6 Click Score: 19    End of Session Equipment Utilized During Treatment: Gait belt Activity Tolerance: Patient tolerated treatment well Patient left: in bed;with bed alarm set;with call bell/phone within reach;with nursing/sitter in room;with family/visitor present Nurse Communication: Mobility status PT Visit Diagnosis: Unsteadiness on feet (R26.81);History of falling (Z91.81)     Time: 6789-3810 PT Time Calculation (min) (ACUTE ONLY): 30 min  Charges:  $Gait Training: 23-37 mins                     Roney Marion, [place] Office [phone]    Colletta [place] 10/21/2022, 6:09 PM

## 2022-10-21 NOTE — Progress Notes (Signed)
PROGRESS NOTE    Douglas Walker  VZD:[id number] DOB: 07/30/1925 DOA: 10/13/2022 PCP: Corliss Blacker, MD   Brief Narrative:  This 88 year old Male with PMH of diastolic CHF, A-fib on Eliquis, SSS/PPM, CVA, CKD-3B, anemia, AAA, chronic Foley, OA on chronic prednisone and right eye vision loss presented with fever, vomiting and admitted for severe sepsis due to complicated UTI in the setting of chronic Foley catheter, AKI and hypotension.  He was started on broad-spectrum antibiotics.  ID was consulted.  Foley catheter was exchanged the next day. Subsequently, urology was consulted for abnormal CT of the abdomen and pelvis.  ID recommended to continue IV antibiotics till 10/22/2022.  Assessment & Plan:   Principal Problem:   Severe sepsis ([place]) Active Problems:   Complicated UTI (urinary tract infection)   Acute kidney injury superimposed on chronic kidney disease (HCC)   Nausea and vomiting   Hypotension   Normocytic anemia   Chronic diastolic CHF (congestive heart failure) (HCC)   Paroxysmal atrial fibrillation (HCC)   Chronic anticoagulation   SSS (sick sinus syndrome) (HCC)   Hypothyroidism   Chronic pain syndrome   Thrombocytopenia (HCC)   Chronic indwelling Foley catheter   AAA (abdominal aortic aneurysm) ([place])  Severe sepsis present on admission: Complicated UTI /CAUTI in patient with chronic Foley catheter POA. -Currently on ertapenem.  ID recommended to complete 10 days of antibiotics with end date being 10/22/2022.   -Midline catheter placed on 10/16/2022.  Outpatient follow-up with ID.   -Foley catheter has already been exchanged on 10/14/2022.   -Repeat urine culture after Foley exchange was negative.   -Initial urine culture from prior Foley grew multiple species -CT of the abdomen and pelvis concerning for  Bladder abscess. -Urology evaluation appreciated: Urology does not think that patient has bladder wall abscess but thinks that CT findings suggest bladder  diverticula.  Urology recommends outpatient follow-up with urology. -Blood cultures negative so far -Hemodynamically improving and currently afebrile.   AKI on CKD stage IIIb: -Creatinine 2.77 on presentation.  Much improved to 1.71 on 10/16/2022.   Treated with IV fluids.  Creatinine 1.20 on 10/19/2022.  Monitor intermittently.   Nausea and vomiting: Resolved.   Leukocytosis: Resolved.   Anemia of chronic disease: -Hemoglobin currently stable.  Eliquis on hold.   -Resume Eliquis on discharge if hemoglobin remains stable.   Chronic thrombocytopenia -Questionable cause.  Monitor intermittently.   Chronic diastolic CHF -Echo in 01/9517 had shown EF of 50 to 55%.  Currently compensated.   Strict input and output.  Daily weights.  Lasix on hold.   Outpatient follow-up with cardiology   Persistent A-fib /SSS status post pacemaker -Eliquis on hold as above due to gross hematuria after Foley exchange. -Currently controlled.  Outpatient follow-up with cardiology   Chronic pain syndrome: continue Cymbalta   Hypothyroidism: Continue levothyroxine   Osteoarthritis- Continue home prednisone   AAA: - 5.1 cm ascending thoracic aortic aneurysm noted last on CT on 08/29/2022. -Continue outpatient monitoring   Physical deconditioning -PT recommended home with PT.   Family not willing to take him home with a midline since patient has intermittent confusion and patient might end up pulling the midline.  Family wants the patient to stay in the hospital to complete antibiotic treatment.   Possible dementia -Possible undiagnosed dementia.   Has had episodes of confusion.  Fall precautions.  Outpatient follow-up.   Goals of care -Palliative care following.   Outpatient follow-up with palliative care    DVT prophylaxis:SCDs Code  Status: DNR Family Communication:No family at bed side Disposition Plan:   Status is: Inpatient Remains inpatient appropriate because: Severity of  illness. Medically stable for discharge on IV antibiotics to home.    Consultants:  ID-urology  Procedures: Urinary catheter exchange Antimicrobials:  Anti-infectives (From admission, onward)    Start     Dose/Rate Route Frequency Ordered Stop   10/19/22 1000  ertapenem (INVANZ) 1,000 mg in sodium chloride 0.9 % 100 mL IVPB        1 g 200 mL/hr over 30 Minutes Intravenous Every 24 hours 10/19/22 0708 10/23/22 0959   10/17/22 0500  ertapenem (INVANZ) 500 mg in sodium chloride 0.9 % 50 mL IVPB  Status:  Discontinued        500 mg 100 mL/hr over 30 Minutes Intravenous Every 24 hours 10/16/22 1440 10/19/22 0708   10/14/22 0500  meropenem (MERREM) 500 mg in sodium chloride 0.9 % 100 mL IVPB        500 mg 200 mL/hr over 30 Minutes Intravenous Every 12 hours 10/13/22 1625 10/16/22 1803   10/13/22 1500  meropenem (MERREM) 1 g in sodium chloride 0.9 % 100 mL IVPB        1 g 200 mL/hr over 30 Minutes Intravenous  Once 10/13/22 1448 10/13/22 1613       Subjective: Patient was seen and examined at bedside.  Overnight events noted.  Patient seems demented. He is following commands, intermittent confusion.  Objective: Vitals:   10/20/22 2152 10/21/22 0500 10/21/22 0526 10/21/22 0834  BP: 138/73  133/75 136/71  Pulse: 70  69 69  Resp: '18  18 19  '$ Temp: 98.2 F (36.8 C)  98.4 F (36.9 C) 98.2 F (36.8 C)  TempSrc:    Oral  SpO2: 97%  94% 97%  Weight:  85.7 kg    Height:        Intake/Output Summary (Last 24 hours) at 10/21/2022 1516 Last data filed at 10/21/2022 1400 Gross per 24 hour  Intake 800 ml  Output 2150 ml  Net -1350 ml   Filed Weights   10/19/22 0500 10/20/22 0500 10/21/22 0500  Weight: 88.9 kg 88.6 kg 85.7 kg    Examination:  General exam: Appears comfortable, not in any acute distress, chronically ill looking and deconditioned, Respiratory system: CTA bilaterally, no wheezing, no crackles, normal respiratory effort. Cardiovascular system: S1 & S2 heard,  regular rate and rhythm, no murmur. Gastrointestinal system: Abdomen is soft, non tender, non distended, BS+ Central nervous system: Alert and oriented X1. No focal neurological deficits. Extremities: Mild edema, no cyanosis, no clubbing Skin: No rashes, lesions or ulcers Psychiatry: Judgement and insight appear normal. Mood & affect appropriate.     Data Reviewed: I have personally reviewed following labs and imaging studies  CBC: Recent Labs  Lab 10/15/22 0317 10/16/22 0232 10/17/22 0445 10/18/22 0421  WBC 12.0* 9.7 6.5 5.8  NEUTROABS  --   --  4.4 3.3  HGB 10.1* 10.6* 9.8* 10.0*  HCT 29.3* 30.4* 28.5* 29.7*  MCV 95.1 94.1 96.0 96.1  PLT 65* 64* 63* 74*   Basic Metabolic Panel: Recent Labs  Lab 10/15/22 0317 10/16/22 0232 10/18/22 0421 10/19/22 0111  NA 135 137 139 141  K 3.9 3.7 3.7 3.5  CL 102 105 107 112*  CO2 '25 23 25 '$ 20*  GLUCOSE 111* 103* 96 81  BUN 44* 40* 33* 29*  CREATININE 1.99* 1.71* 1.48* 1.20  CALCIUM 8.8* 9.1 8.8* 8.0*  MG 2.2 2.2 2.1  --  PHOS 2.9 2.6  --   --    GFR: Estimated Creatinine Clearance: 39.9 mL/min (by C-G formula based on SCr of 1.2 mg/dL). Liver Function Tests: Recent Labs  Lab 10/15/22 0317 10/16/22 0232  AST 19  --   ALT 24  --   ALKPHOS 75  --   BILITOT 0.8  --   PROT 5.8*  --   ALBUMIN 2.7* 2.7*   No results for input(s): "LIPASE", "AMYLASE" in the last 168 hours. No results for input(s): "AMMONIA" in the last 168 hours. Coagulation Profile: No results for input(s): "INR", "PROTIME" in the last 168 hours. Cardiac Enzymes: No results for input(s): "CKTOTAL", "CKMB", "CKMBINDEX", "TROPONINI" in the last 168 hours. BNP (last 3 results) No results for input(s): "PROBNP" in the last 8760 hours. HbA1C: No results for input(s): "HGBA1C" in the last 72 hours. CBG: No results for input(s): "GLUCAP" in the last 168 hours. Lipid Profile: No results for input(s): "CHOL", "HDL", "LDLCALC", "TRIG", "CHOLHDL", "LDLDIRECT" in  the last 72 hours. Thyroid Function Tests: No results for input(s): "TSH", "T4TOTAL", "FREET4", "T3FREE", "THYROIDAB" in the last 72 hours. Anemia Panel: No results for input(s): "VITAMINB12", "FOLATE", "FERRITIN", "TIBC", "IRON", "RETICCTPCT" in the last 72 hours. Sepsis Labs: No results for input(s): "PROCALCITON", "LATICACIDVEN" in the last 168 hours.  Recent Results (from the past 240 hour(s))  Blood Culture (routine x 2)     Status: None   Collection Time: 10/13/22  1:56 PM   Specimen: BLOOD  Result Value Ref Range Status   Specimen Description BLOOD LEFT ANTECUBITAL  Final   Special Requests   Final    BOTTLES DRAWN AEROBIC AND ANAEROBIC Blood Culture adequate volume   Culture   Final    NO GROWTH 5 DAYS Performed at [facility], [address] [place], [place] [address]    Report Status 10/18/2022 FINAL  Final  Urine Culture     Status: Abnormal   Collection Time: 10/13/22  2:30 PM   Specimen: In/Out Cath Urine  Result Value Ref Range Status   Specimen Description IN/OUT CATH URINE  Final   Special Requests   Final    NONE Performed at [facility], [place] [address] [place], [place] [address]    Culture MULTIPLE SPECIES PRESENT, SUGGEST RECOLLECTION (A)  Final   Report Status 10/14/2022 FINAL  Final  Blood Culture (routine x 2)     Status: None   Collection Time: 10/13/22  2:35 PM   Specimen: BLOOD  Result Value Ref Range Status   Specimen Description BLOOD LEFT  Final   Special Requests   Final    BOTTLES DRAWN AEROBIC ONLY Blood Culture results may not be optimal due to an inadequate volume of blood received in culture bottles   Culture   Final    NO GROWTH 5 DAYS Performed at [facility], [place] [address], [place], [place] [address]    Report Status 10/18/2022 FINAL  Final  Remove and replace urinary cath (placed > 5 days) then obtain urine culture from new indwelling urinary catheter.     Status: None   Collection Time: 10/14/22 12:40 PM    Specimen: Urine, Catheterized  Result Value Ref Range Status   Specimen Description URINE, CATHETERIZED  Final   Special Requests NONE  Final   Culture   Final    NO GROWTH Performed at [facility], [address] [place], [place] [address]    Report Status 10/16/2022  FINAL  Final    Radiology Studies: No results found.  Scheduled Meds:  Chlorhexidine Gluconate Cloth  6 each Topical Daily   DULoxetine  60 mg Oral Daily   levothyroxine  25 mcg Oral QAC breakfast   loratadine  10 mg Oral Daily   midodrine  2.5 mg Oral TID WC   pantoprazole  40 mg Oral Daily   predniSONE  5 mg Oral Daily   pregabalin  50 mg Oral QHS   sodium chloride flush  10-40 mL Intracatheter Q12H   sodium chloride flush  3 mL Intravenous Q12H   topiramate  50 mg Oral BID   traMADol  50 mg Oral QHS   Continuous Infusions:  ertapenem 1,000 mg (10/21/22 0920)     LOS: 8 days    Time spent: 50 mins    Ruston Fedora, MD [facility]   If 7PM-7AM, please contact night-coverage

## 2022-10-22 DIAGNOSIS — A419 Sepsis, unspecified organism: Secondary | ICD-10-CM | POA: Diagnosis not present

## 2022-10-22 DIAGNOSIS — R6520 Severe sepsis without septic shock: Secondary | ICD-10-CM | POA: Diagnosis not present

## 2022-10-22 LAB — CBC
HCT: 30.1 % — ABNORMAL LOW (ref 39.0–52.0)
Hemoglobin: 10.1 g/dL — ABNORMAL LOW (ref 13.0–17.0)
MCH: 31.8 pg (ref 26.0–34.0)
MCHC: 33.6 g/dL (ref 30.0–36.0)
MCV: 94.7 fL (ref 80.0–100.0)
Platelets: 120 10*3/uL — ABNORMAL LOW (ref 150–400)
RBC: 3.18 MIL/uL — ABNORMAL LOW (ref 4.22–5.81)
RDW: 15.5 % (ref 11.5–15.5)
WBC: 6.9 10*3/uL (ref 4.0–10.5)
nRBC: 0 % (ref 0.0–0.2)

## 2022-10-22 LAB — MAGNESIUM: Magnesium: 2 mg/dL (ref 1.7–2.4)

## 2022-10-22 LAB — BASIC METABOLIC PANEL
Anion gap: 9 (ref 5–15)
BUN: 22 mg/dL (ref 8–23)
CO2: 22 mmol/L (ref 22–32)
Calcium: 9 mg/dL (ref 8.9–10.3)
Chloride: 106 mmol/L (ref 98–111)
Creatinine, Ser: 1.24 mg/dL (ref 0.61–1.24)
GFR, Estimated: 54 mL/min — ABNORMAL LOW (ref >60)
Glucose, Bld: 89 mg/dL (ref 70–99)
Potassium: 3.5 mmol/L (ref 3.5–5.1)
Sodium: 137 mmol/L (ref 135–145)

## 2022-10-22 LAB — PHOSPHORUS: Phosphorus: 3 mg/dL (ref 2.5–4.6)

## 2022-10-22 MED ORDER — MIDODRINE HCL 2.5 MG PO TABS: 2.5000 mg | ORAL_TABLET | Freq: Three times a day (TID) | ORAL | 1 refills | Status: DC

## 2022-10-22 NOTE — Discharge Summary (Signed)
Physician Discharge Summary  BRENTLEY LANDFAIR KNL:[id number] DOB: 12-15-1925 DOA: 10/13/2022  PCP: Corliss Blacker, MD  Admit date: 10/13/2022  Discharge date: 10/22/2022  Admitted From: Home.  Disposition:Home Health Services  Recommendations for Outpatient Follow-up:  Follow up with PCP in 1-2 weeks. Please obtain BMP/CBC in one week. Home with services been arranged. Patient has completed course of IV antibiotics for ESBL UTI.  Home Health: Home PT/OT Equipment/Devices: None  Discharge Condition: Stable CODE STATUS: DNR Diet recommendation: Heart Healthy   Brief Summary/ Hospital Course: This 88 year old Male with PMH of diastolic CHF, A-fib on Eliquis, SSS/PPM, CVA, CKD-3B, anemia, AAA, chronic Foley, OA on chronic prednisone and right eye vision loss presented with fever, vomiting and admitted for severe sepsis due to complicated UTI in the setting of chronic Foley catheter, AKI and hypotension.  He was started on broad-spectrum antibiotics.  ID was consulted.  Foley catheter was exchanged the next day. Subsequently, urology was consulted for abnormal CT of the abdomen and pelvis.  ID recommended to continue IV antibiotics till 10/22/2022.  Patient has completed 10-day course of antibiotics for ESBL UTI.  PT and OT recommended home health services.  Patient feels much better,  remains afebrile during hospitalization.  Patient is being discharged home today.  Home Health services been arranged.   Discharge Diagnoses:  Principal Problem:   Severe sepsis ([place]) Active Problems:   Complicated UTI (urinary tract infection)   Acute kidney injury superimposed on chronic kidney disease (HCC)   Nausea and vomiting   Hypotension   Normocytic anemia   Chronic diastolic CHF (congestive heart failure) (HCC)   Paroxysmal atrial fibrillation (HCC)   Chronic anticoagulation   SSS (sick sinus syndrome) (HCC)   Hypothyroidism   Chronic pain syndrome   Thrombocytopenia (HCC)    Chronic indwelling Foley catheter   AAA (abdominal aortic aneurysm) ([place])  Severe sepsis present on admission: Complicated UTI /CAUTI in patient with chronic Foley catheter POA. -Currently on ertapenem.  ID recommended to complete 10 days of antibiotics with end date being 10/22/2022.   -Midline catheter placed on 10/16/2022.  Outpatient follow-up with ID.   -Foley catheter has already been exchanged on 10/14/2022.   -Repeat urine culture after Foley exchange was negative.   -Initial urine culture from prior Foley grew multiple species -CT of the abdomen and pelvis concerning for  Bladder abscess. -Urology evaluation appreciated: Urology does not think that patient has bladder wall abscess but thinks that CT findings suggest bladder diverticula.   Urology recommends outpatient follow-up with urology. -Blood cultures negative so far -Hemodynamically improving and currently afebrile. -Completed 10-day course of IV antibiotics.  Patient being discharged home,  sepsis resolved.   AKI on CKD stage IIIb: -Creatinine 2.77 on presentation.  Much improved to 1.71 on 10/16/2022.   Treated with IV fluids.  Creatinine 1.20 on 10/19/2022.  Monitor intermittently.   Nausea and vomiting: Resolved.   Leukocytosis: Resolved.   Anemia of chronic disease: -Hemoglobin currently stable.  Eliquis on hold.   -Resume Eliquis on discharge if hemoglobin remains stable.   Chronic thrombocytopenia -Questionable cause.  Monitor intermittently.   Chronic diastolic CHF -Echo in 06/9023 had shown EF of 50 to 55%.  Currently compensated.   Strict input and output.  Daily weights.  Lasix on hold.   Outpatient follow-up with cardiology   Persistent A-fib /SSS status post pacemaker -Eliquis on hold as above due to gross hematuria after Foley exchange. -Currently controlled.  Outpatient follow-up with cardiology. Eliquis resumed  on discharge.   Chronic pain syndrome: continue Cymbalta    Hypothyroidism: Continue levothyroxine   Osteoarthritis- Continue home prednisone   AAA: - 5.1 cm ascending thoracic aortic aneurysm noted last on CT on 08/29/2022. -Continue outpatient monitoring.   Physical deconditioning -PT recommended home with PT.   Family not willing to take him home with a midline since patient has intermittent confusion and patient might end up pulling the midline.   Family wants the patient to stay in the hospital to complete antibiotic treatment.   Possible dementia -Possible undiagnosed dementia.   Has had episodes of confusion.  Fall precautions.  Outpatient follow-up.   Goals of care -Palliative care following.   Outpatient follow-up with palliative care.    Discharge Instructions  Discharge Instructions     Call MD for:  difficulty breathing, headache or visual disturbances   Complete by: As directed    Call MD for:  persistant dizziness or light-headedness   Complete by: As directed    Call MD for:  persistant nausea and vomiting   Complete by: As directed    Diet - low sodium heart healthy   Complete by: As directed    Discharge instructions   Complete by: As directed    Advised to follow-up with primary care physician in 1 week.   Home with services been arranged. Patient has completed course of IV antibiotics for UTI   Increase activity slowly   Complete by: As directed    No wound care   Complete by: As directed       Allergies as of 10/22/2022       Reactions   Sulfa Antibiotics Other (See Comments)   Weakness Dizziness  Sweats   Mysoline [primidone] Other (See Comments)   Sedation    Zocor [simvastatin] Other (See Comments)   Arthralgias Fatigue        Medication List     TAKE these medications    acetaminophen 325 MG tablet Commonly known as: TYLENOL Take 2 tablets (650 mg total) by mouth every 4 (four) hours as needed for mild pain (or temp > 37.5 C (99.5 F)).   apixaban 2.5 MG Tabs tablet Commonly  known as: ELIQUIS Take 1 tablet (2.5 mg total) by mouth 2 (two) times daily.   cyanocobalamin 1000 MCG tablet Commonly known as: VITAMIN B12 Take 1 tablet (1,000 mcg total) by mouth every other day. What changed:  when to take this additional instructions   DULoxetine 60 MG capsule Commonly known as: Cymbalta Take 1 capsule (60 mg total) by mouth daily.   erythromycin ophthalmic ointment Place 1 Application into the right eye daily as needed (for plastic conformer).   furosemide 20 MG tablet Commonly known as: LASIX Take 2 tablets (40 mg total) by mouth daily. You amy take 1 additional tablet daily as needed for swelling if top bp number over 105   levothyroxine 25 MCG tablet Commonly known as: SYNTHROID Take 1 tablet (25 mcg total) by mouth daily before breakfast.   loratadine 10 MG tablet Commonly known as: CLARITIN Take 10 mg by mouth daily.   midodrine 2.5 MG tablet Commonly known as: PROAMATINE Take 1 tablet (2.5 mg total) by mouth 3 (three) times daily with meals.   Milk of Magnesia 400 MG/5ML suspension Generic drug: magnesium hydroxide Take 30 mLs by mouth daily as needed for mild constipation.   multivitamin with minerals Tabs tablet Take 1 tablet by mouth daily.   nitrofurantoin (macrocrystal-monohydrate) 100 MG capsule Commonly  known as: MACROBID Take 100 mg by mouth daily. Continuous course.   Ocuvite Adult 50+ Caps Take 1 capsule by mouth daily.   polyethylene glycol 17 g packet Commonly known as: MIRALAX / GLYCOLAX Take 17 g by mouth daily. What changed:  when to take this reasons to take this   predniSONE 5 MG tablet Commonly known as: DELTASONE Take 5 mg by mouth daily.   pregabalin 50 MG capsule Commonly known as: LYRICA Take 50 mg by mouth at bedtime.   topiramate 50 MG tablet Commonly known as: TOPAMAX Take 1 tablet (50 mg total) by mouth 2 (two) times daily.   traMADol 50 MG tablet Commonly known as: ULTRAM Take 50 mg by mouth at  bedtime.   vitamin C 1000 MG tablet Take 1,000 mg by mouth daily.        Follow-up Information     [facility] Follow up.   Specialty: Home Health Services Why: Someone will call you to resume care. Contact information: [address] [place] [address] [phone]         Corliss Blacker, MD Follow up in 1 week(s).   Specialty: Internal Medicine Contact information: [address]  [place] [address] [phone]                Allergies  Allergen Reactions   Sulfa Antibiotics Other (See Comments)    Weakness Dizziness  Sweats    Mysoline [Primidone] Other (See Comments)    Sedation    Zocor [Simvastatin] Other (See Comments)    Arthralgias Fatigue    Consultations: None   Procedures/Studies: CT ABDOMEN PELVIS WO CONTRAST  Result Date: 10/14/2022 CLINICAL DATA:  Sepsis, abscess. History of recurrent urinary tract infection and pyelonephritis. Hypotension. Chronic indwelling catheter. EXAM: CT ABDOMEN AND PELVIS WITHOUT CONTRAST TECHNIQUE: Multidetector CT imaging of the abdomen and pelvis was performed following the standard protocol without IV contrast. RADIATION DOSE REDUCTION: This exam was performed according to the departmental dose-optimization program which includes automated exposure control, adjustment of the mA and/or kV according to patient size and/or use of iterative reconstruction technique. COMPARISON:  08/29/2022 FINDINGS: Lower chest: Trace bilateral pleural effusions with increase in dependent atelectasis in both lower lobes. Stable mild nodularity in the left lower lobe including a 4 mm left lower lobe nodule on image 26 series 5. Descending thoracic aortic and coronary atherosclerosis noted with pacer leads observed. Hepatobiliary: Stable sub-centimeter hypodense hepatic lesions, probably cysts although technically nonspecific on today's noncontrast CT. Gallbladder unremarkable. Old  granulomatous disease involving the liver noted. Pancreas: Unremarkable Spleen: Unremarkable Adrenals/Urinary Tract: Both adrenal glands appear normal. Fluid density renal lesions favor cysts on today's noncontrast exam. These cystic lesions do not require independent workup. No hydronephrosis or hydroureter. Calcifications along the renal hila are vascular in nature. Empty urinary bladder noted with a Foley catheter in the urinary bladder. There is some luminal gas in the urinary bladder particularly along what is thought to likely be anterior bladder diverticula, these are less likely to be small abscesses along the anterior margin of the urinary bladder but the lack of distention of the urinary bladder makes this a difficult assessment. There is some mild stranding in the adipose tissues anterior to the urinary bladder suggesting inflammation. Stomach/Bowel: Contrast medium in the distal esophagus suggest reflux or dysmotility. Borderline prominence of stool in the rectal vault. Notable sigmoid colon diverticulosis. No definite active diverticulitis. Vascular/Lymphatic: Atherosclerosis is present, including aortoiliac atherosclerotic disease. There  is some atherosclerotic calcification in the superior mesenteric artery and in both renal arteries. No pathologic adenopathy is observed. Reproductive: Dense calcifications in the prostate gland. Other: Trace fluid in the right paracolic gutter. Musculoskeletal: Trace subcutaneous and mesenteric edema diffusely. Lumbar spondylosis and degenerative disc disease with resulting foraminal impingement bilaterally at L3-4 and L4-5, and on the left at L5-S1. IMPRESSION: 1. Foley catheter in the urinary bladder with luminal gas in the urinary bladder particularly within suspected anterior bladder diverticula, these are less likely to be small abscesses along the anterior margin of the urinary bladder but the lack of distention of the urinary bladder makes this a difficult  differentiation. There is some mild stranding in the adipose tissues anterior to the urinary bladder suggesting inflammation/cystitis. 2. Trace bilateral pleural effusions with increase in dependent atelectasis in both lower lobes. 3. Contrast medium in the distal esophagus suggest reflux or dysmotility. 4. Lumbar spondylosis and degenerative disc disease causing foraminal impingement bilaterally at L3-4 and L4-5 and on the left at L5-S1. 5. Aortic atherosclerosis. Aortic Atherosclerosis (ICD10-I70.0). Electronically Signed   By: Van Clines M.D.   On: 10/14/2022 17:08   US RENAL  Result Date: 10/14/2022 CLINICAL DATA:  Acute renal injury. EXAM: RENAL / URINARY TRACT ULTRASOUND COMPLETE COMPARISON:  CTA of the abdomen of 08/29/2022 FINDINGS: Right Kidney: Renal measurements: 10.0 x 4.5 x 4.8 cm = volume: 115 mL. Normal renal cortical thickness for age. 1.8 cm cyst. No hydronephrosis. Left Kidney: Renal measurements: 11.0 x 6.1 x 4.4 cm = volume: 155 mL. Normal cortical thickness for age. No hydronephrosis. Lower pole 2.4 cm renal cyst. Bladder: Decompressed around a Foley catheter. Other: None. IMPRESSION: No acute process or explanation for acute renal insufficiency. Electronically Signed   By: Abigail Miyamoto M.D.   On: 10/14/2022 13:40   DG Chest Port 1 View  Result Date: 10/13/2022 CLINICAL DATA:  Sepsis EXAM: PORTABLE CHEST 1 VIEW COMPARISON:  08/29/2022 FINDINGS: Cardiomegaly with left chest multi lead pacer. Both lungs are clear. The visualized skeletal structures are unremarkable. IMPRESSION: Cardiomegaly without acute abnormality of the lungs in AP portable projection. Electronically Signed   By: Delanna Ahmadi M.D.   On: 10/13/2022 14:54   B-Scan Ultrasound - OD - Right Eye  Result Date: 09/23/2022 Quality was good. Findings included choroidal detachment, extensive retinal detachment, vitreous hemorrhage. Notes Total retinal detachment secondary to spontaneous expulsion of hemorrhagic  choroidal detachments,    Subjective: Patient was seen and examined at bedside.  Overnight events noted.   Patient seems much improved.  Patient has completed 10-day course of IV antibiotics.   Patient being discharged home today.  Home health services been arranged.  Discharge Exam: Vitals:   10/22/22 0608 10/22/22 0928  BP: (!) 147/85 137/71  Pulse: 70 69  Resp: 18 18  Temp: 97.8 F (36.6 C) 98.2 F (36.8 C)  SpO2: 97% 100%   Vitals:   10/21/22 1727 10/21/22 2134 10/22/22 0608 10/22/22 0928  BP: 138/72 135/73 (!) 147/85 137/71  Pulse: 70 69 70 69  Resp: '16 18 18 18  '$ Temp: 98 F (36.7 C) 97.9 F (36.6 C) 97.8 F (36.6 C) 98.2 F (36.8 C)  TempSrc: Oral  Oral Oral  SpO2: 97% 96% 97% 100%  Weight:      Height:        General: Pt is alert, awake, not in acute distress Cardiovascular: RRR, S1/S2 +, no rubs, no gallops Respiratory: CTA bilaterally, no wheezing, no rhonchi Abdominal: Soft, NT, ND,  bowel sounds + Extremities: no edema, no cyanosis    The results of significant diagnostics from this hospitalization (including imaging, microbiology, ancillary and laboratory) are listed below for reference.     Microbiology: Recent Results (from the past 240 hour(s))  Blood Culture (routine x 2)     Status: None   Collection Time: 10/13/22  1:56 PM   Specimen: BLOOD  Result Value Ref Range Status   Specimen Description BLOOD LEFT ANTECUBITAL  Final   Special Requests   Final    BOTTLES DRAWN AEROBIC AND ANAEROBIC Blood Culture adequate volume   Culture   Final    NO GROWTH 5 DAYS Performed at [facility], [address], [place], [place] [address]    Report Status 10/18/2022 FINAL  Final  Urine Culture     Status: Abnormal   Collection Time: 10/13/22  2:30 PM   Specimen: In/Out Cath Urine  Result Value Ref Range Status   Specimen Description IN/OUT CATH URINE  Final   Special Requests   Final    NONE Performed at [facility], [place] [address], [place], [place] [address]    Culture MULTIPLE SPECIES PRESENT, SUGGEST RECOLLECTION (A)  Final   Report Status 10/14/2022 FINAL  Final  Blood Culture (routine x 2)     Status: None   Collection Time: 10/13/22  2:35 PM   Specimen: BLOOD  Result Value Ref Range Status   Specimen Description BLOOD LEFT  Final   Special Requests   Final    BOTTLES DRAWN AEROBIC ONLY Blood Culture results may not be optimal due to an inadequate volume of blood received in culture bottles   Culture   Final    NO GROWTH 5 DAYS Performed at [facility], [place] [address] [place], [place] [address]    Report Status 10/18/2022 FINAL  Final  Remove and replace urinary cath (placed > 5 days) then obtain urine culture from new indwelling urinary catheter.     Status: None   Collection Time: 10/14/22 12:40 PM   Specimen: Urine, Catheterized  Result Value Ref Range Status   Specimen Description URINE, CATHETERIZED  Final   Special Requests NONE  Final   Culture   Final    NO GROWTH Performed at [facility], [address], [place], [place] [address]    Report Status 10/16/2022 FINAL  Final     Labs: BNP (last 3 results) Recent Labs    08/29/22 1524  BNP 335.4*   Basic Metabolic Panel: Recent Labs  Lab 10/16/22 0232 10/18/22 0421 10/19/22 0111 10/22/22 0454  NA 137 139 141 137  K 3.7 3.7 3.5 3.5  CL 105 107 112* 106  CO2 23 25 20* 22  GLUCOSE 103* 96 81 89  BUN 40* 33* 29* 22  CREATININE 1.71* 1.48* 1.20 1.24  CALCIUM 9.1 8.8* 8.0* 9.0  MG 2.2 2.1  --  2.0  PHOS 2.6  --   --  3.0   Liver Function Tests: Recent Labs  Lab 10/16/22 0232  ALBUMIN 2.7*   No results for input(s): "LIPASE", "AMYLASE" in the last 168 hours. No results for input(s): "AMMONIA" in the last 168 hours. CBC: Recent Labs  Lab 10/16/22 0232 10/17/22 0445 10/18/22 0421 10/22/22 0454  WBC 9.7 6.5 5.8 6.9  NEUTROABS  --  4.4 3.3  --   HGB 10.6* 9.8* 10.0* 10.1*  HCT 30.4* 28.5* 29.7* 30.1*  MCV 94.1  96.0 96.1  94.7  PLT 64* 63* 74* 120*   Cardiac Enzymes: No results for input(s): "CKTOTAL", "CKMB", "CKMBINDEX", "TROPONINI" in the last 168 hours. BNP: Invalid input(s): "POCBNP" CBG: No results for input(s): "GLUCAP" in the last 168 hours. D-Dimer No results for input(s): "DDIMER" in the last 72 hours. Hgb A1c No results for input(s): "HGBA1C" in the last 72 hours. Lipid Profile No results for input(s): "CHOL", "HDL", "LDLCALC", "TRIG", "CHOLHDL", "LDLDIRECT" in the last 72 hours. Thyroid function studies No results for input(s): "TSH", "T4TOTAL", "T3FREE", "THYROIDAB" in the last 72 hours.  Invalid input(s): "FREET3" Anemia work up No results for input(s): "VITAMINB12", "FOLATE", "FERRITIN", "TIBC", "IRON", "RETICCTPCT" in the last 72 hours. Urinalysis    Component Value Date/Time   COLORURINE AMBER (A) 10/14/2022 1628   APPEARANCEUR CLOUDY (A) 10/14/2022 1628   LABSPEC 1.012 10/14/2022 1628   PHURINE 5.0 10/14/2022 1628   GLUCOSEU NEGATIVE 10/14/2022 1628   HGBUR LARGE (A) 10/14/2022 1628   BILIRUBINUR NEGATIVE 10/14/2022 1628   KETONESUR NEGATIVE 10/14/2022 1628   PROTEINUR 100 (A) 10/14/2022 1628   UROBILINOGEN 0.2 06/18/2015 0441   NITRITE NEGATIVE 10/14/2022 1628   LEUKOCYTESUR LARGE (A) 10/14/2022 1628   Sepsis Labs Recent Labs  Lab 10/16/22 0232 10/17/22 0445 10/18/22 0421 10/22/22 0454  WBC 9.7 6.5 5.8 6.9   Microbiology Recent Results (from the past 240 hour(s))  Blood Culture (routine x 2)     Status: None   Collection Time: 10/13/22  1:56 PM   Specimen: BLOOD  Result Value Ref Range Status   Specimen Description BLOOD LEFT ANTECUBITAL  Final   Special Requests   Final    BOTTLES DRAWN AEROBIC AND ANAEROBIC Blood Culture adequate volume   Culture   Final    NO GROWTH 5 DAYS Performed at [facility], [place] [address] [place], [place] [address]    Report Status 10/18/2022 FINAL  Final  Urine Culture     Status: Abnormal   Collection Time:  10/13/22  2:30 PM   Specimen: In/Out Cath Urine  Result Value Ref Range Status   Specimen Description IN/OUT CATH URINE  Final   Special Requests   Final    NONE Performed at [facility], [place] [address], [place], [place] [address]    Culture MULTIPLE SPECIES PRESENT, SUGGEST RECOLLECTION (A)  Final   Report Status 10/14/2022 FINAL  Final  Blood Culture (routine x 2)     Status: None   Collection Time: 10/13/22  2:35 PM   Specimen: BLOOD  Result Value Ref Range Status   Specimen Description BLOOD LEFT  Final   Special Requests   Final    BOTTLES DRAWN AEROBIC ONLY Blood Culture results may not be optimal due to an inadequate volume of blood received in culture bottles   Culture   Final    NO GROWTH 5 DAYS Performed at [facility], [place] [address] [place], [place] [address]    Report Status 10/18/2022 FINAL  Final  Remove and replace urinary cath (placed > 5 days) then obtain urine culture from new indwelling urinary catheter.     Status: None   Collection Time: 10/14/22 12:40 PM   Specimen: Urine, Catheterized  Result Value Ref Range Status   Specimen Description URINE, CATHETERIZED  Final   Special Requests NONE  Final   Culture   Final    NO GROWTH Performed at [facility], [address] [place], [place] [address]    Report  Status 10/16/2022 FINAL  Final     Time coordinating discharge: Over 30 minutes  SIGNED:   Shawna Clamp, MD  [facility] 10/22/2022, 4:15 PM Pager   If 7PM-7AM, please contact night-coverage

## 2022-10-22 NOTE — TOC Transition Note (Addendum)
Transition of Care [facility]) - CM/SW Discharge Note   Patient Details  Name: AMON COSTILLA MRN: [id number] Date of Birth: [birth year]  Transition of [facility]) CM/SW Contact:  Tom-Johnson, Renea Ee, RN Phone Number: 10/22/2022, 1:09 PM   Clinical Narrative:     Patient is scheduled for discharge today. Home health info on AVS. Denies any other needs. Family to transport at discharge. No further TOC needs noted   Final next level of care: [place] Barriers to Discharge: Barriers Resolved   Patient Goals and CMS Choice Patient states their goals for this hospitalization and ongoing recovery are:: To return home CMS Medicare.gov Compare Post Acute Care list provided to:: Patient Choice offered to / list presented to : Patient, Adult Children  Discharge Placement                Patient to be transferred to facility by: Family      Discharge Plan and Services                DME Arranged: N/A DME Agency: NA       HH Arranged: PT, OT HH Agency: [place] Date [place]: 10/21/22 Time [place]: 7654 Representative spoke with at [place]: [place] ([place]) Interventions     Readmission Risk Interventions    10/15/2021   10:43 AM 07/26/2020   10:05 AM  Readmission Risk Prevention Plan  Transportation Screening Complete Complete  PCP or Specialist Appt within 5-7 Days  Not Complete  Not Complete comments  pending disposition  Home Care Screening  Complete  Medication Review (RN CM)  Referral to Pharmacy  Medication Review (RN Care Manager) Complete   PCP or Specialist appointment within 3-5 days of discharge Complete   HRI or Home Care Consult Complete   SW Recovery Care/Counseling Consult Complete   [place] Complete

## 2022-10-22 NOTE — Discharge Instructions (Signed)
Advised to follow-up with primary care physician in 1 week.   Home with services been arranged. Patient has completed course of IV antibiotics for UTI

## 2022-10-22 NOTE — Progress Notes (Signed)
DISCHARGE NOTE HOME Douglas Walker to be discharged Home per MD order. Discussed prescriptions and follow up appointments with the patient. Prescriptions given to patient; medication list explained in detail. Patient verbalized understanding.  Skin clean, dry and intact without evidence of skin break down, no evidence of skin tears noted. IV catheter discontinued intact. Site without signs and symptoms of complications. Dressing and pressure applied. Pt denies pain at the site currently. No complaints noted.  Patient free of lines, drains, and wounds.   An After Visit Summary (AVS) was printed and given to the patient. Patient escorted via wheelchair, and discharged home via private auto.  Arlyss Repress, RN

## 2022-10-23 ENCOUNTER — Ambulatory Visit (INDEPENDENT_AMBULATORY_CARE_PROVIDER_SITE_OTHER): Payer: Medicare Other

## 2022-10-23 DIAGNOSIS — I442 Atrioventricular block, complete: Secondary | ICD-10-CM

## 2022-10-24 LAB — CUP PACEART REMOTE DEVICE CHECK
Battery Remaining Longevity: 20 mo
Battery Voltage: 2.9 V
Brady Statistic AP VP Percent: 99.3 %
Brady Statistic AP VS Percent: 0 %
Brady Statistic AS VP Percent: 0 %
Brady Statistic AS VS Percent: 0.7 %
Brady Statistic RA Percent Paced: 99.84 %
Brady Statistic RV Percent Paced: 99.3 %
Date Time Interrogation Session: 20231025100512
Implantable Lead Connection Status: 753985
Implantable Lead Connection Status: 753985
Implantable Lead Implant Date: 20191206
Implantable Lead Implant Date: 20191206
Implantable Lead Location: 753860
Implantable Lead Location: 753860
Implantable Lead Model: 3830
Implantable Lead Model: 5076
Implantable Pulse Generator Implant Date: 20191206
Lead Channel Impedance Value: 285 Ohm
Lead Channel Impedance Value: 323 Ohm
Lead Channel Impedance Value: 380 Ohm
Lead Channel Impedance Value: 380 Ohm
Lead Channel Sensing Intrinsic Amplitude: 5.125 mV
Lead Channel Sensing Intrinsic Amplitude: 5.125 mV
Lead Channel Sensing Intrinsic Amplitude: 9.75 mV
Lead Channel Sensing Intrinsic Amplitude: 9.75 mV
Lead Channel Setting Pacing Amplitude: 2 V
Lead Channel Setting Pacing Amplitude: 2.5 V
Lead Channel Setting Pacing Pulse Width: 0.4 ms
Lead Channel Setting Sensing Sensitivity: 1.2 mV
Zone Setting Status: 755011
Zone Setting Status: 755011

## 2022-10-25 DIAGNOSIS — Z1612 Extended spectrum beta lactamase (ESBL) resistance: Secondary | ICD-10-CM | POA: Diagnosis not present

## 2022-10-25 DIAGNOSIS — Z23 Encounter for immunization: Secondary | ICD-10-CM | POA: Diagnosis not present

## 2022-10-25 DIAGNOSIS — N10 Acute tubulo-interstitial nephritis: Secondary | ICD-10-CM | POA: Diagnosis not present

## 2022-10-25 DIAGNOSIS — R410 Disorientation, unspecified: Secondary | ICD-10-CM | POA: Diagnosis not present

## 2022-10-25 DIAGNOSIS — R531 Weakness: Secondary | ICD-10-CM | POA: Diagnosis not present

## 2022-10-26 DIAGNOSIS — M5136 Other intervertebral disc degeneration, lumbar region: Secondary | ICD-10-CM | POA: Diagnosis not present

## 2022-10-26 DIAGNOSIS — I5032 Chronic diastolic (congestive) heart failure: Secondary | ICD-10-CM | POA: Diagnosis not present

## 2022-10-26 DIAGNOSIS — E039 Hypothyroidism, unspecified: Secondary | ICD-10-CM | POA: Diagnosis not present

## 2022-10-26 DIAGNOSIS — D631 Anemia in chronic kidney disease: Secondary | ICD-10-CM | POA: Diagnosis not present

## 2022-10-26 DIAGNOSIS — J9811 Atelectasis: Secondary | ICD-10-CM | POA: Diagnosis not present

## 2022-10-26 DIAGNOSIS — G629 Polyneuropathy, unspecified: Secondary | ICD-10-CM | POA: Diagnosis not present

## 2022-10-26 DIAGNOSIS — H547 Unspecified visual loss: Secondary | ICD-10-CM | POA: Diagnosis not present

## 2022-10-26 DIAGNOSIS — Z7901 Long term (current) use of anticoagulants: Secondary | ICD-10-CM | POA: Diagnosis not present

## 2022-10-26 DIAGNOSIS — T83511D Infection and inflammatory reaction due to indwelling urethral catheter, subsequent encounter: Secondary | ICD-10-CM | POA: Diagnosis not present

## 2022-10-26 DIAGNOSIS — Z8673 Personal history of transient ischemic attack (TIA), and cerebral infarction without residual deficits: Secondary | ICD-10-CM | POA: Diagnosis not present

## 2022-10-26 DIAGNOSIS — Z7952 Long term (current) use of systemic steroids: Secondary | ICD-10-CM | POA: Diagnosis not present

## 2022-10-26 DIAGNOSIS — M1990 Unspecified osteoarthritis, unspecified site: Secondary | ICD-10-CM | POA: Diagnosis not present

## 2022-10-26 DIAGNOSIS — Z87440 Personal history of urinary (tract) infections: Secondary | ICD-10-CM | POA: Diagnosis not present

## 2022-10-26 DIAGNOSIS — Z9181 History of falling: Secondary | ICD-10-CM | POA: Diagnosis not present

## 2022-10-26 DIAGNOSIS — Z950 Presence of cardiac pacemaker: Secondary | ICD-10-CM | POA: Diagnosis not present

## 2022-10-26 DIAGNOSIS — G894 Chronic pain syndrome: Secondary | ICD-10-CM | POA: Diagnosis not present

## 2022-10-26 DIAGNOSIS — I495 Sick sinus syndrome: Secondary | ICD-10-CM | POA: Diagnosis not present

## 2022-10-26 DIAGNOSIS — N1832 Chronic kidney disease, stage 3b: Secondary | ICD-10-CM | POA: Diagnosis not present

## 2022-10-26 DIAGNOSIS — I700 Atherosclerosis of aorta: Secondary | ICD-10-CM | POA: Diagnosis not present

## 2022-10-26 DIAGNOSIS — D696 Thrombocytopenia, unspecified: Secondary | ICD-10-CM | POA: Diagnosis not present

## 2022-10-26 DIAGNOSIS — M47816 Spondylosis without myelopathy or radiculopathy, lumbar region: Secondary | ICD-10-CM | POA: Diagnosis not present

## 2022-10-26 DIAGNOSIS — I130 Hypertensive heart and chronic kidney disease with heart failure and stage 1 through stage 4 chronic kidney disease, or unspecified chronic kidney disease: Secondary | ICD-10-CM | POA: Diagnosis not present

## 2022-10-26 DIAGNOSIS — N179 Acute kidney failure, unspecified: Secondary | ICD-10-CM | POA: Diagnosis not present

## 2022-10-26 DIAGNOSIS — I714 Abdominal aortic aneurysm, without rupture, unspecified: Secondary | ICD-10-CM | POA: Diagnosis not present

## 2022-10-26 DIAGNOSIS — I480 Paroxysmal atrial fibrillation: Secondary | ICD-10-CM | POA: Diagnosis not present

## 2022-10-28 DIAGNOSIS — D631 Anemia in chronic kidney disease: Secondary | ICD-10-CM | POA: Diagnosis not present

## 2022-10-28 DIAGNOSIS — I5032 Chronic diastolic (congestive) heart failure: Secondary | ICD-10-CM | POA: Diagnosis not present

## 2022-10-28 DIAGNOSIS — I130 Hypertensive heart and chronic kidney disease with heart failure and stage 1 through stage 4 chronic kidney disease, or unspecified chronic kidney disease: Secondary | ICD-10-CM | POA: Diagnosis not present

## 2022-10-28 DIAGNOSIS — N179 Acute kidney failure, unspecified: Secondary | ICD-10-CM | POA: Diagnosis not present

## 2022-10-28 DIAGNOSIS — N1832 Chronic kidney disease, stage 3b: Secondary | ICD-10-CM | POA: Diagnosis not present

## 2022-10-28 DIAGNOSIS — T83511D Infection and inflammatory reaction due to indwelling urethral catheter, subsequent encounter: Secondary | ICD-10-CM | POA: Diagnosis not present

## 2022-10-29 DIAGNOSIS — R338 Other retention of urine: Secondary | ICD-10-CM | POA: Diagnosis not present

## 2022-10-30 DIAGNOSIS — D631 Anemia in chronic kidney disease: Secondary | ICD-10-CM | POA: Diagnosis not present

## 2022-10-30 DIAGNOSIS — N1832 Chronic kidney disease, stage 3b: Secondary | ICD-10-CM | POA: Diagnosis not present

## 2022-10-30 DIAGNOSIS — N179 Acute kidney failure, unspecified: Secondary | ICD-10-CM | POA: Diagnosis not present

## 2022-10-30 DIAGNOSIS — I130 Hypertensive heart and chronic kidney disease with heart failure and stage 1 through stage 4 chronic kidney disease, or unspecified chronic kidney disease: Secondary | ICD-10-CM | POA: Diagnosis not present

## 2022-10-30 DIAGNOSIS — T83511D Infection and inflammatory reaction due to indwelling urethral catheter, subsequent encounter: Secondary | ICD-10-CM | POA: Diagnosis not present

## 2022-10-30 DIAGNOSIS — I5032 Chronic diastolic (congestive) heart failure: Secondary | ICD-10-CM | POA: Diagnosis not present

## 2022-11-01 ENCOUNTER — Telehealth: Payer: Self-pay | Admitting: Interventional Cardiology

## 2022-11-01 DIAGNOSIS — T83511D Infection and inflammatory reaction due to indwelling urethral catheter, subsequent encounter: Secondary | ICD-10-CM | POA: Diagnosis not present

## 2022-11-01 DIAGNOSIS — I5032 Chronic diastolic (congestive) heart failure: Secondary | ICD-10-CM | POA: Diagnosis not present

## 2022-11-01 DIAGNOSIS — I130 Hypertensive heart and chronic kidney disease with heart failure and stage 1 through stage 4 chronic kidney disease, or unspecified chronic kidney disease: Secondary | ICD-10-CM | POA: Diagnosis not present

## 2022-11-01 DIAGNOSIS — N179 Acute kidney failure, unspecified: Secondary | ICD-10-CM | POA: Diagnosis not present

## 2022-11-01 DIAGNOSIS — N1832 Chronic kidney disease, stage 3b: Secondary | ICD-10-CM | POA: Diagnosis not present

## 2022-11-01 DIAGNOSIS — D631 Anemia in chronic kidney disease: Secondary | ICD-10-CM | POA: Diagnosis not present

## 2022-11-01 NOTE — Telephone Encounter (Signed)
Pt c/o medication issue:  1. Name of Medication: midodrine (PROAMATINE) 2.5 MG tablet  furosemide (LASIX) 20 MG tablet  2. How are you currently taking this medication (dosage and times per day)?   3. Are you having a reaction (difficulty breathing--STAT)?   4. What is your medication issue? Clair Gulling from [facility] is requesting call back in regards to MyChart message sent earlier by family. He would like to know decision given on these meds and the parameter in which to take with values in BP, etc., change. Requesting fax with orders be sent to:  [phone]

## 2022-11-01 NOTE — Telephone Encounter (Signed)
Triage, you find out if the patient had repeat bloodwork after discharge? The discharge summary mentions recommendation for f/u labs in 1 week. The hospital records indicate Lasix was held while admission then listed on discharge medicine list. Please find out if he is back on this, and how he is doing since discharge along with his BP. Can offer appt next week for follow-up as well.

## 2022-11-04 DIAGNOSIS — T83511D Infection and inflammatory reaction due to indwelling urethral catheter, subsequent encounter: Secondary | ICD-10-CM | POA: Diagnosis not present

## 2022-11-04 DIAGNOSIS — N3020 Other chronic cystitis without hematuria: Secondary | ICD-10-CM | POA: Diagnosis not present

## 2022-11-04 DIAGNOSIS — N481 Balanitis: Secondary | ICD-10-CM | POA: Diagnosis not present

## 2022-11-04 DIAGNOSIS — I130 Hypertensive heart and chronic kidney disease with heart failure and stage 1 through stage 4 chronic kidney disease, or unspecified chronic kidney disease: Secondary | ICD-10-CM | POA: Diagnosis not present

## 2022-11-04 DIAGNOSIS — N1832 Chronic kidney disease, stage 3b: Secondary | ICD-10-CM | POA: Diagnosis not present

## 2022-11-04 DIAGNOSIS — N179 Acute kidney failure, unspecified: Secondary | ICD-10-CM | POA: Diagnosis not present

## 2022-11-04 DIAGNOSIS — D631 Anemia in chronic kidney disease: Secondary | ICD-10-CM | POA: Diagnosis not present

## 2022-11-04 DIAGNOSIS — I5032 Chronic diastolic (congestive) heart failure: Secondary | ICD-10-CM | POA: Diagnosis not present

## 2022-11-04 DIAGNOSIS — R338 Other retention of urine: Secondary | ICD-10-CM | POA: Diagnosis not present

## 2022-11-05 ENCOUNTER — Encounter: Payer: Self-pay | Admitting: Internal Medicine

## 2022-11-05 ENCOUNTER — Other Ambulatory Visit: Payer: Self-pay

## 2022-11-05 ENCOUNTER — Ambulatory Visit: Payer: Self-pay | Admitting: Internal Medicine

## 2022-11-05 ENCOUNTER — Ambulatory Visit (INDEPENDENT_AMBULATORY_CARE_PROVIDER_SITE_OTHER): Payer: Medicare Other | Admitting: Internal Medicine

## 2022-11-05 VITALS — BP 94/49 | HR 69 | Resp 16 | Ht 69.0 in | Wt 183.0 lb

## 2022-11-05 DIAGNOSIS — Z978 Presence of other specified devices: Secondary | ICD-10-CM

## 2022-11-05 DIAGNOSIS — Z79899 Other long term (current) drug therapy: Secondary | ICD-10-CM | POA: Diagnosis not present

## 2022-11-05 DIAGNOSIS — Z22358 Carrier of other enterobacterales: Secondary | ICD-10-CM

## 2022-11-05 DIAGNOSIS — N390 Urinary tract infection, site not specified: Secondary | ICD-10-CM | POA: Diagnosis not present

## 2022-11-05 NOTE — Progress Notes (Signed)
[facility] for Infectious Disease  Reason for Consult:MDRO uti  Referring Provider: Inda Merlin    Patient Active Problem List   Diagnosis Date Noted   Severe sepsis ([place]) 10/13/2022   Nausea and vomiting 10/13/2022   Normocytic anemia 10/13/2022   AAA (abdominal aortic aneurysm) ([place]) 10/13/2022   Physical deconditioning 08/29/2022   Eye pain, right 08/10/2022   Blind painful right eye 08/08/2022   Vitreous hemorrhage of right eye ([place]) 08/05/2022   Choroidal hemorrhage, right 08/05/2022   Secondary glaucoma due to combination mechanisms, right, moderate stage 08/05/2022   History of corneal transplant 05/16/2022   Recurrent UTI 01/31/2022   Chronic indwelling Foley catheter 01/31/2022   Benign prostatic hyperplasia with lower urinary tract symptoms 01/31/2022   Medication monitoring encounter 01/31/2022   Multiple drug resistant organism (MDRO) culture positive 01/31/2022   Bacteremia due to Proteus species 10/11/2021   Acute kidney injury superimposed on chronic kidney disease ([place]) 60/63/0160   Complicated UTI (urinary tract infection) 07/02/2021   Pain in both testicles    Right hemiparesis ([place]) 04/23/2021   Cerebrovascular accident (CVA) due to embolism of left middle cerebral artery ([place]) 04/23/2021   Neuropathy 04/23/2021   Abnormality of gait 04/23/2021   Abnormal results of thyroid function studies 04/04/2021   Acquired iron deficiency anemia due to decreased absorption 04/04/2021   Acquired thrombophilia ([place]) 04/04/2021   Cerebral atherosclerosis 04/04/2021   Basal cell carcinoma of nose 04/04/2021   Benign hypertensive heart and renal disease, with heart and renal failure ([place]) 04/04/2021   Benign prostatic hyperplasia 04/04/2021   Obesity 04/04/2021   Bradycardia 04/04/2021   Closed fracture of one rib 04/04/2021   Constipation by delayed colonic transit 04/04/2021   Fatigue 04/04/2021   Gastro-esophageal reflux disease without esophagitis  04/04/2021   Hypothyroidism 04/04/2021   Left lower quadrant pain 04/04/2021   Lumbar radiculopathy 04/04/2021   Reactive depression 04/04/2021   Mass of chest wall 04/04/2021   Mixed hyperlipidemia 04/04/2021   Nocturia 04/04/2021   Other shoulder lesions, right shoulder 04/04/2021   Pain in limb 04/04/2021   Peripheral edema 04/04/2021   Personal history of malignant neoplasm of bladder 04/04/2021   Pruritus of genitalia 04/04/2021   Tremor 04/04/2021   Unilateral primary osteoarthritis, left knee 04/04/2021   Unspecified mononeuropathy of unspecified lower limb 04/04/2021   Urinary incontinence 04/04/2021   Dysphagia, post-stroke    Chronic diastolic congestive heart failure (HCC)    Chronic pain syndrome    Left middle cerebral artery stroke ([place]) 02/01/2021   Acute CVA (cerebrovascular accident) ([place]) 01/28/2021   Pacemaker 08/30/2020   Hematoma, chest wall 07/24/2020   Exudative age-related macular degeneration of right eye with inactive choroidal neovascularization ([place]) 05/10/2020   Exudative age-related macular degeneration of left eye with inactive choroidal neovascularization ([place]) 05/10/2020   Retinal hemorrhage of right eye 05/10/2020   Exposure keratopathy, bilateral 05/10/2020   Bilateral dry eyes 05/10/2020   Advanced nonexudative age-related macular degeneration of both eyes with subfoveal involvement 05/10/2020   Complete heart block ([place]) 12/04/2018   Chronic low back pain 12/15/2017   Hereditary and idiopathic peripheral neuropathy 07/01/2016   Essential tremor 02/28/2016   BPPV (benign paroxysmal positional vertigo) 06/19/2015   Dizziness 06/18/2015   Bacteria in urine 06/18/2015   Stage 3a chronic kidney disease (CKD) ([place]) 06/18/2015   Chronic diastolic CHF (congestive heart failure) ([place]) 05/30/2015   Fall    Syncope 05/19/2015   Lower urinary tract infectious disease  05/19/2015   Hypotension 05/19/2015   Dehydration 05/19/2015   Fracture of rib of  left side 05/19/2015   Syncope and collapse 05/19/2015   Mobitz type II atrioventricular block 09/13/2014   Essential hypertension 09/13/2014   Chronic anticoagulation 09/13/2014   SSS (sick sinus syndrome) ([place]) 09/13/2014   Paroxysmal atrial fibrillation ([place]) 10/28/2013   Thrombocytopenia ([place]) 04/06/2013    Cc -- f/u recurrent uti/mdro   HPI: Douglas Walker is a 88 y.o. male hx pacemaker, neuropathy, LUTS in setting bph, hx chronic prostatitis, left MCA cva, chf with preserved ef, ckd3, pAfib, hx low-grade transitional cell carcinoma of bladder 2005 (no recurrence on subsequent cystoscopy), referred here to id clinic for hx of MDRO uti. Today patient is here for routine follow up  07/24/22 id clinic visit Patient doing well no uti since I last saw him, might be some gap in his macrobid as he "just started" a couple weeks ago Stable legs swelling Some sob with laying flat chronically Foley changed every 3 weeks  No complaint today    ----------- Reviewed outside record: He has been seen by [facility] urology who has recently recommended continuous bladder drainage with urinary catheter for bph/luts (at least 3 months prior to 11/01/2021 visit, to be changed every 3 weeks) Patient noted to have had 2 uti requiring hospital admission for sepsis. One in 06/2024 with esbl, and one 10/25/21 with proteus. Both given meropenem. He has imaging which showed no GU stone  He appears to have been placed on cephalexin chronic suppression  Previous cx: 10/27 [facility] urine cx >100k proteus mirabilis (R amp/sulb, ceftriaxone, cefazolin, cipro, cefotetan, nitrofurantoin, bactrim; S ertapenem, tobra, amikacin) 09/01 [facility] urine cx > 100k pseudomonas (pan sensitive piptazo, meropenem, cipro, ceftaz, aztreonam, gent) 07/25 [facility] ucx >100k e coli esbl (R cipro; s amikacin, ertapenem, nitrofurantoin, gent, bactrim) 06/09 ucx citrobacter murliniae (R ceftriaxone,  cefotaxim, cipro; S nitrofurantoin, gent, amikacin, nitrofurantoin) 11/2020 ucx esbl ecoli (R cipro; S gent, amikacin, nitrofurantoin, bactrim)     Of note, patient was on macrobid when his first sepsis (06/2021) occurred. Patient was on macrobid for about 3 months   He wasn't on any other prophylaxis at that time   He wasn't on methanamene before. But he is getting cranberry/vit c   ----------------- 04/30/22 id clinic f/u He saw my partner Dr West Bali for follow up on 01/31/22, doing well without recurrent uti on suppressive macrobid He had peri-foley urinary leakage at that time. He had a HH nurse trying to change the foley three times for 45 minutes per his son [name]'s report unsuccessfully. He then went to the urologist and had 1 placed in the clinic  He has had slight bloody drainage. He last have it changed again a week ago -- no gross blood.  He has been putting on neosporin. I had told him to stop neosporin via mychart the last 24-48 hours  He can see some bloody stain on the depend though   No fever/chill/flank pain, suprapubic pain  He has been complaining of discomfort in the urethral area  but no f/c, malaise, altered mentation, lethargy.    11/05/22 id clinic f/u Patient admitted 10/15-24 for sepsis thought to be uti Foley cateter exchanged. Urine cx with old foley polymicrobial; replaced foley ucx negative. Ct abd/pelv suggest bladder diverticula rather than abscess per urology evaluation Completed 10 days meropenem Blood cx negative Was on macrobid prior to that for prophylaxis  Reviewed cx again; prior esbl ecoli (S  bactrim; R cipro; I macrobid); proteus mirabilis (s Bactrim; R macrobid)  He was on macrobid for more than a year prior to this new infection.   Patient had visual hallucination when he was in hospital and a few days after discharge but no longer present. He is at baseline function now   He is on midodrine for hypotension. He is also on low dose  lasix. His heart doctor at this time wants to keep him on lasix He is also on pred 5 mg daily and topamax, lyrica, cymbalta He also takes tramadol 50 mg qhs scheduled   Reviewed chart from 09/2022 admission  Review of Systems: ROS All other ROS negative       Past Medical History:  Diagnosis Date   Anemia    Arthritis    shoulders and back   Cancer ([place])    skin cancers   Chronic diastolic (congestive) heart failure (HCC)    Chronic low back pain 12/15/2017   CKD (chronic kidney disease) stage 3, GFR 30-59 ml/min (HCC)    Essential tremor 02/28/2016   GERD (gastroesophageal reflux disease)    Hernia    History of hiatal hernia    Hyperlipemia    Hypertension    Macular degeneration    Neuromuscular disorder (HCC)    neuropathy   Paroxysmal atrial fibrillation ([place]) 10/28/2013   Presence of permanent cardiac pacemaker 12/04/2018   Pulmonary hypertension ([place])    Stroke ([place]) 12/2020   Tachycardia-bradycardia syndrome ([place]) 09/13/2014   Thoracic aortic aneurysm (HCC)    Thrombocytopenia (HCC)    TIA (transient ischemic attack)     Social History   Tobacco Use   Smoking status: Former    Types: Cigarettes    Quit date: 12/30/1962    Years since quitting: 59.8   Smokeless tobacco: Never  Vaping Use   Vaping Use: Never used  Substance Use Topics   Alcohol use: No   Drug use: No    Family History  Problem Relation Age of Onset   GI problems Mother    Other Sister        PAIN ISSUES   Hearing loss Sister    Blindness Sister    Heart attack Neg Hx    Stroke Neg Hx     Allergies  Allergen Reactions   Sulfa Antibiotics Other (See Comments)    Weakness Dizziness  Sweats    Mysoline [Primidone] Other (See Comments)    Sedation    Zocor [Simvastatin] Other (See Comments)    Arthralgias Fatigue    OBJECTIVE: Vitals:   11/05/22 1006  BP: (!) 94/49  Pulse: 69  Resp: 16  SpO2: 96%  Weight: 183 lb (83 kg)  Height: '5\' 9"'$  (1.753 m)   Body mass  index is 27.02 kg/m.   Physical Exam General/constitutional: no distress, pleasant; using front wheel walker HEENT: Normocephalic, right eye eviscerated; left conj clear Neck supple CV: rrr no mrg Lungs: clear to auscultation, normal respiratory effort Abd: Soft, Nontender Ext: no edema Skin: No Rash  Neuro: nonfocal; resting tremor; hard at hearing, wears hearing aides  Foley in place   Lab: Lab Results  Component Value Date   WBC 6.9 10/22/2022   HGB 10.1 (L) 10/22/2022   HCT 30.1 (L) 10/22/2022   MCV 94.7 10/22/2022   PLT 120 (L) 41/93/7902   Last metabolic panel Lab Results  Component Value Date   GLUCOSE 89 10/22/2022   NA 137 10/22/2022   K 3.5 10/22/2022  CL 106 10/22/2022   CO2 22 10/22/2022   BUN 22 10/22/2022   CREATININE 1.24 10/22/2022   GFRNONAA 54 (L) 10/22/2022   CALCIUM 9.0 10/22/2022   PHOS 3.0 10/22/2022   PROT 5.8 (L) 10/15/2022   ALBUMIN 2.7 (L) 10/16/2022   BILITOT 0.8 10/15/2022   ALKPHOS 75 10/15/2022   AST 19 10/15/2022   ALT 24 10/15/2022   ANIONGAP 9 10/22/2022    Microbiology:  Serology:  Imaging:   Assessment/plan: Problem List Items Addressed This Visit       Genitourinary   Recurrent UTI   Other Visit Diagnoses     ESBL E. coli carrier    -  Primary   Foley catheter in place           Recurrent UTI/MDR UTI  H/o Transitional cell ca of bladder on remission BPH with LUTS s/p chronic indwelling Foleys  As of 11/01/2021 per patient's son, he has had several's uti at least 23 a year  Discussed assymptomatic bacteriuria and risk of this with his bph, foley He does seem to have admissions related to uti/sepsis 06/2021 and 09/2021.  Discuss things to avoid looking at in terms of dx for uti such as odor  Advise patient and his son to call our clinic if concern for uti  Reviewed micros form the past year; majority is sensitive to macrobid. Reviewed current evidence of various gfr which doesn't affect macrobid if  >30 mL/min  Discuss with patient and his son also the failure is determined long term (over 6 months to a year) in terms of seeing reduction in uti frequency. I do not think he has failed macrobid  All his previous bacteria are resistant to cephalexin   ------------ 04/30/22 Doing well without clinical evidence of uti His son had discussed spc before but at this time not ready Likely having urethral/penile meatus irritation via trauma with recent repeated foley placement  -stop neosporin -use vaseline as needed for skin care -- if urology ok with lidocaine ointment could use for symptoms control at this time -look for sign of infection such as suprapubic pain/flank pain, fever, chill, lethargy/malaise and to call our office immediately  -continue macrobid for uti prophy -monitor for sign of lung pneumonitis  11/05/22 assessment Patient had done well for 1 year without uti on macrobid prior to 09/2022 and wants to continue that. We discuss potentially changing to bactrim if he has frequent recurrent uti  Chf/hyptension/polypharmacy can discuss with cards/neurology/pcp to cut down as needed as these can confound uti evaluation  I would advise at least today dc tramadol.    I spent more than 35 minute reviewing data/chart, and coordinating care and >50% direct face to face time providing counseling/discussing diagnostics/treatment plan with patient   Follow-up: Return in about 6 months (around 05/06/2023).  Jabier Mutton, [place] for [place] [phone] pager   [phone] cell 11/05/2022, 10:20 AM

## 2022-11-05 NOTE — Progress Notes (Signed)
Remote pacemaker transmission.   

## 2022-11-05 NOTE — Patient Instructions (Addendum)
Continue macrobid  Discuss with your regular doctor, heart doctor, and neurologist about the many medications  that you might not needed such as cymbalta, tramadol, lyrica  Topiramate is used to control tremor and might need to stay on  I agree stopping tramadol today   Follow up with me in 6 months

## 2022-11-06 DIAGNOSIS — N390 Urinary tract infection, site not specified: Secondary | ICD-10-CM | POA: Diagnosis not present

## 2022-11-06 DIAGNOSIS — I959 Hypotension, unspecified: Secondary | ICD-10-CM | POA: Diagnosis not present

## 2022-11-06 DIAGNOSIS — G629 Polyneuropathy, unspecified: Secondary | ICD-10-CM | POA: Diagnosis not present

## 2022-11-06 DIAGNOSIS — Z7901 Long term (current) use of anticoagulants: Secondary | ICD-10-CM | POA: Diagnosis not present

## 2022-11-06 DIAGNOSIS — I480 Paroxysmal atrial fibrillation: Secondary | ICD-10-CM | POA: Diagnosis not present

## 2022-11-06 DIAGNOSIS — F322 Major depressive disorder, single episode, severe without psychotic features: Secondary | ICD-10-CM | POA: Diagnosis not present

## 2022-11-07 ENCOUNTER — Telehealth: Payer: Self-pay | Admitting: Interventional Cardiology

## 2022-11-07 DIAGNOSIS — I5032 Chronic diastolic (congestive) heart failure: Secondary | ICD-10-CM | POA: Diagnosis not present

## 2022-11-07 DIAGNOSIS — T83511D Infection and inflammatory reaction due to indwelling urethral catheter, subsequent encounter: Secondary | ICD-10-CM | POA: Diagnosis not present

## 2022-11-07 DIAGNOSIS — I130 Hypertensive heart and chronic kidney disease with heart failure and stage 1 through stage 4 chronic kidney disease, or unspecified chronic kidney disease: Secondary | ICD-10-CM | POA: Diagnosis not present

## 2022-11-07 DIAGNOSIS — N179 Acute kidney failure, unspecified: Secondary | ICD-10-CM | POA: Diagnosis not present

## 2022-11-07 DIAGNOSIS — D631 Anemia in chronic kidney disease: Secondary | ICD-10-CM | POA: Diagnosis not present

## 2022-11-07 DIAGNOSIS — N1832 Chronic kidney disease, stage 3b: Secondary | ICD-10-CM | POA: Diagnosis not present

## 2022-11-07 NOTE — Telephone Encounter (Signed)
Douglas Walker with [name] calling to request an updated medication list. He states it seems like there have been some changes made via the chat function and he wants to make sure the list he receives is the newest and most accurate. Fax: [phone] Phone: [phone]

## 2022-11-07 NOTE — Telephone Encounter (Signed)
Returned call to [employer] with [place].  Patient to remain off of Lasix and only take PRN for SOB/edema. Patient to check BP once daily, mid-day. Take midodrine 2.'5mg'$  as needed (up to 3 times daily) for systolic BP less than 295, do not take if systolic BP is 284 or higher.  Douglas Walker verbalized understanding and expressed appreciation for call.

## 2022-11-11 DIAGNOSIS — N1832 Chronic kidney disease, stage 3b: Secondary | ICD-10-CM | POA: Diagnosis not present

## 2022-11-11 DIAGNOSIS — D631 Anemia in chronic kidney disease: Secondary | ICD-10-CM | POA: Diagnosis not present

## 2022-11-11 DIAGNOSIS — I5032 Chronic diastolic (congestive) heart failure: Secondary | ICD-10-CM | POA: Diagnosis not present

## 2022-11-11 DIAGNOSIS — N179 Acute kidney failure, unspecified: Secondary | ICD-10-CM | POA: Diagnosis not present

## 2022-11-11 DIAGNOSIS — T83511D Infection and inflammatory reaction due to indwelling urethral catheter, subsequent encounter: Secondary | ICD-10-CM | POA: Diagnosis not present

## 2022-11-11 DIAGNOSIS — I130 Hypertensive heart and chronic kidney disease with heart failure and stage 1 through stage 4 chronic kidney disease, or unspecified chronic kidney disease: Secondary | ICD-10-CM | POA: Diagnosis not present

## 2022-11-11 NOTE — Telephone Encounter (Signed)
See phone note from 11/07/22.

## 2022-11-14 ENCOUNTER — Telehealth: Payer: Self-pay | Admitting: Interventional Cardiology

## 2022-11-14 DIAGNOSIS — T83511D Infection and inflammatory reaction due to indwelling urethral catheter, subsequent encounter: Secondary | ICD-10-CM | POA: Diagnosis not present

## 2022-11-14 DIAGNOSIS — N179 Acute kidney failure, unspecified: Secondary | ICD-10-CM | POA: Diagnosis not present

## 2022-11-14 DIAGNOSIS — I130 Hypertensive heart and chronic kidney disease with heart failure and stage 1 through stage 4 chronic kidney disease, or unspecified chronic kidney disease: Secondary | ICD-10-CM | POA: Diagnosis not present

## 2022-11-14 DIAGNOSIS — D631 Anemia in chronic kidney disease: Secondary | ICD-10-CM | POA: Diagnosis not present

## 2022-11-14 DIAGNOSIS — N1832 Chronic kidney disease, stage 3b: Secondary | ICD-10-CM | POA: Diagnosis not present

## 2022-11-14 DIAGNOSIS — I5032 Chronic diastolic (congestive) heart failure: Secondary | ICD-10-CM | POA: Diagnosis not present

## 2022-11-14 NOTE — Telephone Encounter (Signed)
Discussed with Douglas Walker and pt is asymptomatic  Lasix was given today Will forward to Dr Tamala Julian for review .Adonis Housekeeper

## 2022-11-14 NOTE — Telephone Encounter (Signed)
Herbert Deaner, [name] PT called to give update on pt  - Daily weight: since yesterday he gained 2lb, last 7 days pt gained 9lbs.   - Swelling- trace, which is his normal   - No c/o SOB or significant fatigue  - Over last week bp has slowly increased but within normal limits, today they did give him lasix.Marland Kitchen

## 2022-11-15 DIAGNOSIS — R338 Other retention of urine: Secondary | ICD-10-CM | POA: Diagnosis not present

## 2022-11-18 MED ORDER — FUROSEMIDE 20 MG PO TABS: 20.0000 mg | ORAL_TABLET | ORAL | Status: DC

## 2022-11-18 NOTE — Telephone Encounter (Signed)
Left detailed message, OK per DPR.  Dr. Thompson Caul recommendation: Resume Lasix 20 mg on M, W, and Fridays. Continue to monitor BP and Midodrine use.   Provided office number for callback if any questions.  Med list updated.

## 2022-11-19 DIAGNOSIS — E039 Hypothyroidism, unspecified: Secondary | ICD-10-CM | POA: Diagnosis not present

## 2022-11-19 DIAGNOSIS — N1832 Chronic kidney disease, stage 3b: Secondary | ICD-10-CM | POA: Diagnosis not present

## 2022-11-19 DIAGNOSIS — I10 Essential (primary) hypertension: Secondary | ICD-10-CM | POA: Diagnosis not present

## 2022-11-19 DIAGNOSIS — N400 Enlarged prostate without lower urinary tract symptoms: Secondary | ICD-10-CM | POA: Diagnosis not present

## 2022-11-19 DIAGNOSIS — N179 Acute kidney failure, unspecified: Secondary | ICD-10-CM | POA: Diagnosis not present

## 2022-11-19 DIAGNOSIS — I5042 Chronic combined systolic (congestive) and diastolic (congestive) heart failure: Secondary | ICD-10-CM | POA: Diagnosis not present

## 2022-11-19 DIAGNOSIS — I5032 Chronic diastolic (congestive) heart failure: Secondary | ICD-10-CM | POA: Diagnosis not present

## 2022-11-19 DIAGNOSIS — I130 Hypertensive heart and chronic kidney disease with heart failure and stage 1 through stage 4 chronic kidney disease, or unspecified chronic kidney disease: Secondary | ICD-10-CM | POA: Diagnosis not present

## 2022-11-19 DIAGNOSIS — T83511D Infection and inflammatory reaction due to indwelling urethral catheter, subsequent encounter: Secondary | ICD-10-CM | POA: Diagnosis not present

## 2022-11-19 DIAGNOSIS — K219 Gastro-esophageal reflux disease without esophagitis: Secondary | ICD-10-CM | POA: Diagnosis not present

## 2022-11-19 DIAGNOSIS — I480 Paroxysmal atrial fibrillation: Secondary | ICD-10-CM | POA: Diagnosis not present

## 2022-11-19 DIAGNOSIS — E782 Mixed hyperlipidemia: Secondary | ICD-10-CM | POA: Diagnosis not present

## 2022-11-19 DIAGNOSIS — F322 Major depressive disorder, single episode, severe without psychotic features: Secondary | ICD-10-CM | POA: Diagnosis not present

## 2022-11-19 DIAGNOSIS — D631 Anemia in chronic kidney disease: Secondary | ICD-10-CM | POA: Diagnosis not present

## 2022-11-25 ENCOUNTER — Ambulatory Visit: Payer: Medicare Other | Admitting: Physician Assistant

## 2022-11-25 DIAGNOSIS — I700 Atherosclerosis of aorta: Secondary | ICD-10-CM | POA: Diagnosis not present

## 2022-11-25 DIAGNOSIS — E039 Hypothyroidism, unspecified: Secondary | ICD-10-CM | POA: Diagnosis not present

## 2022-11-25 DIAGNOSIS — J9811 Atelectasis: Secondary | ICD-10-CM | POA: Diagnosis not present

## 2022-11-25 DIAGNOSIS — N1832 Chronic kidney disease, stage 3b: Secondary | ICD-10-CM | POA: Diagnosis not present

## 2022-11-25 DIAGNOSIS — G629 Polyneuropathy, unspecified: Secondary | ICD-10-CM | POA: Diagnosis not present

## 2022-11-25 DIAGNOSIS — Z8673 Personal history of transient ischemic attack (TIA), and cerebral infarction without residual deficits: Secondary | ICD-10-CM | POA: Diagnosis not present

## 2022-11-25 DIAGNOSIS — Z7952 Long term (current) use of systemic steroids: Secondary | ICD-10-CM | POA: Diagnosis not present

## 2022-11-25 DIAGNOSIS — M5136 Other intervertebral disc degeneration, lumbar region: Secondary | ICD-10-CM | POA: Diagnosis not present

## 2022-11-25 DIAGNOSIS — I480 Paroxysmal atrial fibrillation: Secondary | ICD-10-CM | POA: Diagnosis not present

## 2022-11-25 DIAGNOSIS — Z87440 Personal history of urinary (tract) infections: Secondary | ICD-10-CM | POA: Diagnosis not present

## 2022-11-25 DIAGNOSIS — H547 Unspecified visual loss: Secondary | ICD-10-CM | POA: Diagnosis not present

## 2022-11-25 DIAGNOSIS — M47816 Spondylosis without myelopathy or radiculopathy, lumbar region: Secondary | ICD-10-CM | POA: Diagnosis not present

## 2022-11-25 DIAGNOSIS — I130 Hypertensive heart and chronic kidney disease with heart failure and stage 1 through stage 4 chronic kidney disease, or unspecified chronic kidney disease: Secondary | ICD-10-CM | POA: Diagnosis not present

## 2022-11-25 DIAGNOSIS — N179 Acute kidney failure, unspecified: Secondary | ICD-10-CM | POA: Diagnosis not present

## 2022-11-25 DIAGNOSIS — Z9181 History of falling: Secondary | ICD-10-CM | POA: Diagnosis not present

## 2022-11-25 DIAGNOSIS — T83511D Infection and inflammatory reaction due to indwelling urethral catheter, subsequent encounter: Secondary | ICD-10-CM | POA: Diagnosis not present

## 2022-11-25 DIAGNOSIS — D631 Anemia in chronic kidney disease: Secondary | ICD-10-CM | POA: Diagnosis not present

## 2022-11-25 DIAGNOSIS — Z950 Presence of cardiac pacemaker: Secondary | ICD-10-CM | POA: Diagnosis not present

## 2022-11-25 DIAGNOSIS — Z7901 Long term (current) use of anticoagulants: Secondary | ICD-10-CM | POA: Diagnosis not present

## 2022-11-25 DIAGNOSIS — M1990 Unspecified osteoarthritis, unspecified site: Secondary | ICD-10-CM | POA: Diagnosis not present

## 2022-11-25 DIAGNOSIS — D696 Thrombocytopenia, unspecified: Secondary | ICD-10-CM | POA: Diagnosis not present

## 2022-11-25 DIAGNOSIS — I714 Abdominal aortic aneurysm, without rupture, unspecified: Secondary | ICD-10-CM | POA: Diagnosis not present

## 2022-11-25 DIAGNOSIS — I5032 Chronic diastolic (congestive) heart failure: Secondary | ICD-10-CM | POA: Diagnosis not present

## 2022-11-25 DIAGNOSIS — I495 Sick sinus syndrome: Secondary | ICD-10-CM | POA: Diagnosis not present

## 2022-11-25 DIAGNOSIS — G894 Chronic pain syndrome: Secondary | ICD-10-CM | POA: Diagnosis not present

## 2022-11-27 ENCOUNTER — Telehealth: Payer: Self-pay | Admitting: Interventional Cardiology

## 2022-11-27 DIAGNOSIS — N179 Acute kidney failure, unspecified: Secondary | ICD-10-CM | POA: Diagnosis not present

## 2022-11-27 DIAGNOSIS — I130 Hypertensive heart and chronic kidney disease with heart failure and stage 1 through stage 4 chronic kidney disease, or unspecified chronic kidney disease: Secondary | ICD-10-CM | POA: Diagnosis not present

## 2022-11-27 DIAGNOSIS — N1832 Chronic kidney disease, stage 3b: Secondary | ICD-10-CM | POA: Diagnosis not present

## 2022-11-27 DIAGNOSIS — I5032 Chronic diastolic (congestive) heart failure: Secondary | ICD-10-CM | POA: Diagnosis not present

## 2022-11-27 DIAGNOSIS — I10 Essential (primary) hypertension: Secondary | ICD-10-CM

## 2022-11-27 DIAGNOSIS — Z79899 Other long term (current) drug therapy: Secondary | ICD-10-CM

## 2022-11-27 DIAGNOSIS — D631 Anemia in chronic kidney disease: Secondary | ICD-10-CM | POA: Diagnosis not present

## 2022-11-27 DIAGNOSIS — T83511D Infection and inflammatory reaction due to indwelling urethral catheter, subsequent encounter: Secondary | ICD-10-CM | POA: Diagnosis not present

## 2022-11-27 NOTE — Telephone Encounter (Signed)
Returned call to patient's son [name], [place] per DPR.  Patient began having cramps in leg/thigh after starting Lasix daily (increased from 3 days weekly by son).  [name] reports patient's SBP has been running in the 160s and this is why he started giving patient Lasix '20mg'$  daily instead on just M/W/F as prescribed.  Patient has not been taking any midodrine.  [name] states patient's SBP has come down to 130s/140s on daily Lasix, weight is stable.   Discussed with Dr. Tamala Julian, patient to come in for BMET tomorrow 11/28/2022.  [name] verbalized understanding and expressed appreciation for call.

## 2022-11-27 NOTE — Telephone Encounter (Signed)
Pt c/o medication issue:  1. Name of Medication:   furosemide (LASIX) 20 MG tablet   2. How are you currently taking this medication (dosage and times per day)? As prescribed  3. Are you having a reaction (difficulty breathing--STAT)?  No  4. What is your medication issue?    Caller reported patient has been prescribed this medication 3 days a week but patient's son started giving the patient Lasix 7 days a week. Patient reported on Sunday he had a cramp in left leg and thigh.  Patient reported slight pain on left outer thigh at rest and when moving around 5/10 with no known trauma. Caller requested the patient's son be called [phone]) regarding this medication issue.

## 2022-11-28 ENCOUNTER — Ambulatory Visit: Payer: Medicare Other | Attending: Interventional Cardiology

## 2022-11-28 DIAGNOSIS — Z79899 Other long term (current) drug therapy: Secondary | ICD-10-CM | POA: Diagnosis not present

## 2022-11-28 DIAGNOSIS — I10 Essential (primary) hypertension: Secondary | ICD-10-CM

## 2022-11-28 LAB — BASIC METABOLIC PANEL
BUN/Creatinine Ratio: 16 (ref 10–24)
BUN: 23 mg/dL (ref 10–36)
CO2: 29 mmol/L (ref 20–29)
Calcium: 9.4 mg/dL (ref 8.6–10.2)
Chloride: 105 mmol/L (ref 96–106)
Creatinine, Ser: 1.47 mg/dL — ABNORMAL HIGH (ref 0.76–1.27)
Glucose: 114 mg/dL — ABNORMAL HIGH (ref 70–99)
Potassium: 3.8 mmol/L (ref 3.5–5.2)
Sodium: 143 mmol/L (ref 134–144)
eGFR: 44 mL/min/{1.73_m2} — ABNORMAL LOW (ref >59)

## 2022-11-29 ENCOUNTER — Ambulatory Visit: Payer: Medicare Other | Admitting: Podiatry

## 2022-11-29 ENCOUNTER — Ambulatory Visit (INDEPENDENT_AMBULATORY_CARE_PROVIDER_SITE_OTHER): Payer: Medicare Other | Admitting: Podiatry

## 2022-11-29 ENCOUNTER — Other Ambulatory Visit: Payer: Self-pay | Admitting: Internal Medicine

## 2022-11-29 ENCOUNTER — Encounter: Payer: Self-pay | Admitting: Podiatry

## 2022-11-29 VITALS — BP 108/55

## 2022-11-29 DIAGNOSIS — D696 Thrombocytopenia, unspecified: Secondary | ICD-10-CM

## 2022-11-29 DIAGNOSIS — D689 Coagulation defect, unspecified: Secondary | ICD-10-CM | POA: Diagnosis not present

## 2022-11-29 DIAGNOSIS — L608 Other nail disorders: Secondary | ICD-10-CM

## 2022-11-29 DIAGNOSIS — E1142 Type 2 diabetes mellitus with diabetic polyneuropathy: Secondary | ICD-10-CM | POA: Diagnosis not present

## 2022-11-29 DIAGNOSIS — M79676 Pain in unspecified toe(s): Secondary | ICD-10-CM | POA: Diagnosis not present

## 2022-11-29 DIAGNOSIS — B351 Tinea unguium: Secondary | ICD-10-CM

## 2022-11-29 NOTE — Progress Notes (Signed)
This patient returns to my office for at risk foot care.  This patient requires this care by a professional since this patient will be at risk due to having peripheral neuropathy, CKD, Coagulation defect and thrombocytopenia.  This patient is unable to cut nails himself since the patient cannot reach his nails.These nails are painful walking and wearing shoes.  This patient presents for at risk foot care today.  He presents to the office with male son.  He is taking eliquis.  General Appearance  Alert, conversant and in no acute stress.  Vascular  Dorsalis pedis and posterior tibial  pulses are not  palpable  bilaterally.  Capillary return is within normal limits  bilaterally. Temperature is within normal limits  Bilaterally. Swelling feet/legs  Neurologic  Senn-Weinstein monofilament wire test within normal limits  bilaterally. Muscle power within normal limits bilaterally.  Nails Thick disfigured discolored nails with subungual debris  from hallux to fifth toes bilaterally. Pincer nails hallux  B/L. No evidence of bacterial infection or drainage bilaterally.  Orthopedic  No limitations of motion  feet .  No crepitus or effusions noted.  No bony pathology or digital deformities noted.  HAV  B/L.  Skin  normotropic skin with no porokeratosis noted bilaterally.  No signs of infections or ulcers noted.     Onychomycosis  Pain in right toes  Pain in left toes  Consent was obtained for treatment procedures.   Mechanical debridement of nails 1-5  bilaterally performed with a nail nipper.  Filed with dremel without incident. Cauterized second toe left foot at iatrogenic lesion.   Return office visit  10 weeks                   Told patient to return for periodic foot care and evaluation due to potential at risk complications.   Gardiner Barefoot DPM

## 2022-12-02 DIAGNOSIS — I130 Hypertensive heart and chronic kidney disease with heart failure and stage 1 through stage 4 chronic kidney disease, or unspecified chronic kidney disease: Secondary | ICD-10-CM | POA: Diagnosis not present

## 2022-12-02 DIAGNOSIS — T83511D Infection and inflammatory reaction due to indwelling urethral catheter, subsequent encounter: Secondary | ICD-10-CM | POA: Diagnosis not present

## 2022-12-02 DIAGNOSIS — N1832 Chronic kidney disease, stage 3b: Secondary | ICD-10-CM | POA: Diagnosis not present

## 2022-12-02 DIAGNOSIS — D631 Anemia in chronic kidney disease: Secondary | ICD-10-CM | POA: Diagnosis not present

## 2022-12-02 DIAGNOSIS — N179 Acute kidney failure, unspecified: Secondary | ICD-10-CM | POA: Diagnosis not present

## 2022-12-02 DIAGNOSIS — I5032 Chronic diastolic (congestive) heart failure: Secondary | ICD-10-CM | POA: Diagnosis not present

## 2022-12-09 DIAGNOSIS — R338 Other retention of urine: Secondary | ICD-10-CM | POA: Diagnosis not present

## 2022-12-16 DIAGNOSIS — N1832 Chronic kidney disease, stage 3b: Secondary | ICD-10-CM | POA: Diagnosis not present

## 2022-12-16 DIAGNOSIS — I5032 Chronic diastolic (congestive) heart failure: Secondary | ICD-10-CM | POA: Diagnosis not present

## 2022-12-16 DIAGNOSIS — I130 Hypertensive heart and chronic kidney disease with heart failure and stage 1 through stage 4 chronic kidney disease, or unspecified chronic kidney disease: Secondary | ICD-10-CM | POA: Diagnosis not present

## 2022-12-16 DIAGNOSIS — T83511D Infection and inflammatory reaction due to indwelling urethral catheter, subsequent encounter: Secondary | ICD-10-CM | POA: Diagnosis not present

## 2022-12-16 DIAGNOSIS — D631 Anemia in chronic kidney disease: Secondary | ICD-10-CM | POA: Diagnosis not present

## 2022-12-16 DIAGNOSIS — N179 Acute kidney failure, unspecified: Secondary | ICD-10-CM | POA: Diagnosis not present

## 2023-01-01 ENCOUNTER — Other Ambulatory Visit: Payer: Self-pay

## 2023-01-01 ENCOUNTER — Inpatient Hospital Stay (HOSPITAL_COMMUNITY)
Admission: EM | Admit: 2023-01-01 | Discharge: 2023-01-06 | DRG: 690 | Disposition: A | Discharge status: Home Health | Payer: Medicare Other | Attending: Internal Medicine | Admitting: Internal Medicine

## 2023-01-01 ENCOUNTER — Encounter (HOSPITAL_COMMUNITY): Payer: Self-pay

## 2023-01-01 DIAGNOSIS — R310 Gross hematuria: Secondary | ICD-10-CM

## 2023-01-01 DIAGNOSIS — Z881 Allergy status to other antibiotic agents status: Secondary | ICD-10-CM

## 2023-01-01 DIAGNOSIS — Z85828 Personal history of other malignant neoplasm of skin: Secondary | ICD-10-CM

## 2023-01-01 DIAGNOSIS — G250 Essential tremor: Secondary | ICD-10-CM | POA: Diagnosis present

## 2023-01-01 DIAGNOSIS — R918 Other nonspecific abnormal finding of lung field: Secondary | ICD-10-CM | POA: Diagnosis present

## 2023-01-01 DIAGNOSIS — N179 Acute kidney failure, unspecified: Secondary | ICD-10-CM | POA: Diagnosis present

## 2023-01-01 DIAGNOSIS — E785 Hyperlipidemia, unspecified: Secondary | ICD-10-CM | POA: Diagnosis present

## 2023-01-01 DIAGNOSIS — Z66 Do not resuscitate: Secondary | ICD-10-CM | POA: Diagnosis present

## 2023-01-01 DIAGNOSIS — Z79891 Long term (current) use of opiate analgesic: Secondary | ICD-10-CM

## 2023-01-01 DIAGNOSIS — I272 Pulmonary hypertension, unspecified: Secondary | ICD-10-CM | POA: Diagnosis present

## 2023-01-01 DIAGNOSIS — N3001 Acute cystitis with hematuria: Secondary | ICD-10-CM | POA: Diagnosis not present

## 2023-01-01 DIAGNOSIS — I130 Hypertensive heart and chronic kidney disease with heart failure and stage 1 through stage 4 chronic kidney disease, or unspecified chronic kidney disease: Secondary | ICD-10-CM | POA: Diagnosis present

## 2023-01-01 DIAGNOSIS — Z950 Presence of cardiac pacemaker: Secondary | ICD-10-CM

## 2023-01-01 DIAGNOSIS — A419 Sepsis, unspecified organism: Principal | ICD-10-CM

## 2023-01-01 DIAGNOSIS — M25569 Pain in unspecified knee: Secondary | ICD-10-CM | POA: Diagnosis not present

## 2023-01-01 DIAGNOSIS — N189 Chronic kidney disease, unspecified: Secondary | ICD-10-CM | POA: Diagnosis present

## 2023-01-01 DIAGNOSIS — N3000 Acute cystitis without hematuria: Secondary | ICD-10-CM

## 2023-01-01 DIAGNOSIS — W19XXXA Unspecified fall, initial encounter: Secondary | ICD-10-CM | POA: Diagnosis present

## 2023-01-01 DIAGNOSIS — N1831 Chronic kidney disease, stage 3a: Secondary | ICD-10-CM | POA: Diagnosis present

## 2023-01-01 DIAGNOSIS — I480 Paroxysmal atrial fibrillation: Secondary | ICD-10-CM | POA: Diagnosis present

## 2023-01-01 DIAGNOSIS — R296 Repeated falls: Secondary | ICD-10-CM | POA: Diagnosis present

## 2023-01-01 DIAGNOSIS — Z79890 Hormone replacement therapy (postmenopausal): Secondary | ICD-10-CM

## 2023-01-01 DIAGNOSIS — Z7901 Long term (current) use of anticoagulants: Secondary | ICD-10-CM

## 2023-01-01 DIAGNOSIS — Z888 Allergy status to other drugs, medicaments and biological substances status: Secondary | ICD-10-CM

## 2023-01-01 DIAGNOSIS — Z960 Presence of urogenital implants: Secondary | ICD-10-CM | POA: Diagnosis present

## 2023-01-01 DIAGNOSIS — Z87891 Personal history of nicotine dependence: Secondary | ICD-10-CM

## 2023-01-01 DIAGNOSIS — Z87440 Personal history of urinary (tract) infections: Secondary | ICD-10-CM

## 2023-01-01 DIAGNOSIS — I495 Sick sinus syndrome: Secondary | ICD-10-CM | POA: Diagnosis present

## 2023-01-01 DIAGNOSIS — E860 Dehydration: Secondary | ICD-10-CM | POA: Diagnosis present

## 2023-01-01 DIAGNOSIS — Z9181 History of falling: Secondary | ICD-10-CM

## 2023-01-01 DIAGNOSIS — R319 Hematuria, unspecified: Secondary | ICD-10-CM | POA: Diagnosis present

## 2023-01-01 DIAGNOSIS — N400 Enlarged prostate without lower urinary tract symptoms: Secondary | ICD-10-CM | POA: Diagnosis present

## 2023-01-01 DIAGNOSIS — R627 Adult failure to thrive: Secondary | ICD-10-CM

## 2023-01-01 DIAGNOSIS — Z8673 Personal history of transient ischemic attack (TIA), and cerebral infarction without residual deficits: Secondary | ICD-10-CM

## 2023-01-01 DIAGNOSIS — Z7952 Long term (current) use of systemic steroids: Secondary | ICD-10-CM

## 2023-01-01 DIAGNOSIS — H3530 Unspecified macular degeneration: Secondary | ICD-10-CM | POA: Diagnosis present

## 2023-01-01 DIAGNOSIS — S2241XA Multiple fractures of ribs, right side, initial encounter for closed fracture: Secondary | ICD-10-CM | POA: Diagnosis present

## 2023-01-01 DIAGNOSIS — G629 Polyneuropathy, unspecified: Secondary | ICD-10-CM | POA: Diagnosis present

## 2023-01-01 DIAGNOSIS — K219 Gastro-esophageal reflux disease without esophagitis: Secondary | ICD-10-CM | POA: Diagnosis present

## 2023-01-01 DIAGNOSIS — Z882 Allergy status to sulfonamides status: Secondary | ICD-10-CM

## 2023-01-01 DIAGNOSIS — Z821 Family history of blindness and visual loss: Secondary | ICD-10-CM

## 2023-01-01 DIAGNOSIS — D696 Thrombocytopenia, unspecified: Secondary | ICD-10-CM | POA: Diagnosis present

## 2023-01-01 DIAGNOSIS — Z1152 Encounter for screening for COVID-19: Secondary | ICD-10-CM

## 2023-01-01 DIAGNOSIS — Z79899 Other long term (current) drug therapy: Secondary | ICD-10-CM

## 2023-01-01 DIAGNOSIS — Z792 Long term (current) use of antibiotics: Secondary | ICD-10-CM

## 2023-01-01 DIAGNOSIS — I5032 Chronic diastolic (congestive) heart failure: Secondary | ICD-10-CM | POA: Diagnosis present

## 2023-01-01 LAB — RESP PANEL BY RT-PCR (RSV, FLU A&B, COVID)  RVPGX2
Influenza A by PCR: NEGATIVE
Influenza B by PCR: NEGATIVE
Resp Syncytial Virus by PCR: NEGATIVE
SARS Coronavirus 2 by RT PCR: NEGATIVE

## 2023-01-01 LAB — CBC
HCT: 32.2 % — ABNORMAL LOW (ref 39.0–52.0)
Hemoglobin: 10.5 g/dL — ABNORMAL LOW (ref 13.0–17.0)
MCH: 32.2 pg (ref 26.0–34.0)
MCHC: 32.6 g/dL (ref 30.0–36.0)
MCV: 98.8 fL (ref 80.0–100.0)
Platelets: 81 10*3/uL — ABNORMAL LOW (ref 150–400)
RBC: 3.26 MIL/uL — ABNORMAL LOW (ref 4.22–5.81)
RDW: 15.9 % — ABNORMAL HIGH (ref 11.5–15.5)
WBC: 19.3 10*3/uL — ABNORMAL HIGH (ref 4.0–10.5)
nRBC: 0 % (ref 0.0–0.2)

## 2023-01-01 NOTE — ED Triage Notes (Signed)
Pt BIB EMS with reports of hematuria. Pt had his foley changed yesterday and started having bright red blood. Blood is now brown.

## 2023-01-01 NOTE — ED Provider Triage Note (Signed)
Emergency Medicine Provider Triage Evaluation Note  Douglas Walker , a 89 y.o. male  was evaluated in triage.  Pt complains of Foley not draining.  Patient was seen here yesterday and had Foley changed.  Patient reports that he believes his Foley is now clogged, has not been able to urinate or have urine passed through it since this afternoon.  Patient complaining of suprapubic abdominal pain.  Patient also complaining of elevated temperature, has temperature of 99 in triage.  EMS reports this patient is prophylactically on antibiotics for UTI.  Review of Systems  Positive:  Negative:   Physical Exam  BP (!) 144/120 (BP Location: Left Arm)   Pulse 69   Temp 99 F (37.2 C) (Oral)   Resp 20   Ht '5\' 9"'$  (1.753 m)   Wt 83 kg   SpO2 96%   BMI 27.02 kg/m  Gen:   Awake, no distress   Resp:  Normal effort  MSK:   Moves extremities without difficulty  Other:    Medical Decision Making  Medically screening exam initiated at 8:40 PM.  Appropriate orders placed.  Douglas Walker was informed that the remainder of the evaluation will be completed by another provider, this initial triage assessment does not replace that evaluation, and the importance of remaining in the ED until their evaluation is complete.     Douglas Cecil, PA-C 01/01/23 2042

## 2023-01-01 NOTE — ED Notes (Addendum)
Dr green, Lt green and lav sent to lab.

## 2023-01-02 ENCOUNTER — Emergency Department (HOSPITAL_COMMUNITY): Payer: Medicare Other

## 2023-01-02 DIAGNOSIS — I495 Sick sinus syndrome: Secondary | ICD-10-CM | POA: Diagnosis present

## 2023-01-02 DIAGNOSIS — N179 Acute kidney failure, unspecified: Secondary | ICD-10-CM

## 2023-01-02 DIAGNOSIS — Z85828 Personal history of other malignant neoplasm of skin: Secondary | ICD-10-CM | POA: Diagnosis not present

## 2023-01-02 DIAGNOSIS — Z1152 Encounter for screening for COVID-19: Secondary | ICD-10-CM | POA: Diagnosis not present

## 2023-01-02 DIAGNOSIS — E785 Hyperlipidemia, unspecified: Secondary | ICD-10-CM | POA: Diagnosis present

## 2023-01-02 DIAGNOSIS — R296 Repeated falls: Secondary | ICD-10-CM | POA: Diagnosis present

## 2023-01-02 DIAGNOSIS — N3001 Acute cystitis with hematuria: Secondary | ICD-10-CM | POA: Diagnosis present

## 2023-01-02 DIAGNOSIS — R627 Adult failure to thrive: Secondary | ICD-10-CM

## 2023-01-02 DIAGNOSIS — I130 Hypertensive heart and chronic kidney disease with heart failure and stage 1 through stage 4 chronic kidney disease, or unspecified chronic kidney disease: Secondary | ICD-10-CM | POA: Diagnosis present

## 2023-01-02 DIAGNOSIS — N1831 Chronic kidney disease, stage 3a: Secondary | ICD-10-CM | POA: Diagnosis present

## 2023-01-02 DIAGNOSIS — R319 Hematuria, unspecified: Secondary | ICD-10-CM | POA: Diagnosis present

## 2023-01-02 DIAGNOSIS — S2241XA Multiple fractures of ribs, right side, initial encounter for closed fracture: Secondary | ICD-10-CM | POA: Diagnosis present

## 2023-01-02 DIAGNOSIS — Z66 Do not resuscitate: Secondary | ICD-10-CM | POA: Diagnosis present

## 2023-01-02 DIAGNOSIS — R918 Other nonspecific abnormal finding of lung field: Secondary | ICD-10-CM | POA: Diagnosis present

## 2023-01-02 DIAGNOSIS — N400 Enlarged prostate without lower urinary tract symptoms: Secondary | ICD-10-CM | POA: Diagnosis present

## 2023-01-02 DIAGNOSIS — G250 Essential tremor: Secondary | ICD-10-CM | POA: Diagnosis present

## 2023-01-02 DIAGNOSIS — I480 Paroxysmal atrial fibrillation: Secondary | ICD-10-CM | POA: Diagnosis present

## 2023-01-02 DIAGNOSIS — Z978 Presence of other specified devices: Secondary | ICD-10-CM

## 2023-01-02 DIAGNOSIS — G629 Polyneuropathy, unspecified: Secondary | ICD-10-CM | POA: Diagnosis present

## 2023-01-02 DIAGNOSIS — R310 Gross hematuria: Secondary | ICD-10-CM | POA: Diagnosis present

## 2023-01-02 DIAGNOSIS — I272 Pulmonary hypertension, unspecified: Secondary | ICD-10-CM | POA: Diagnosis present

## 2023-01-02 DIAGNOSIS — W19XXXA Unspecified fall, initial encounter: Secondary | ICD-10-CM | POA: Diagnosis present

## 2023-01-02 DIAGNOSIS — E860 Dehydration: Secondary | ICD-10-CM | POA: Diagnosis present

## 2023-01-02 DIAGNOSIS — D696 Thrombocytopenia, unspecified: Secondary | ICD-10-CM | POA: Diagnosis present

## 2023-01-02 DIAGNOSIS — I5032 Chronic diastolic (congestive) heart failure: Secondary | ICD-10-CM | POA: Diagnosis present

## 2023-01-02 DIAGNOSIS — Z950 Presence of cardiac pacemaker: Secondary | ICD-10-CM | POA: Diagnosis not present

## 2023-01-02 DIAGNOSIS — H3530 Unspecified macular degeneration: Secondary | ICD-10-CM | POA: Diagnosis present

## 2023-01-02 DIAGNOSIS — Z7901 Long term (current) use of anticoagulants: Secondary | ICD-10-CM | POA: Diagnosis not present

## 2023-01-02 LAB — URINALYSIS, ROUTINE W REFLEX MICROSCOPIC
Bilirubin Urine: NEGATIVE
Glucose, UA: NEGATIVE mg/dL
Ketones, ur: NEGATIVE mg/dL
Nitrite: NEGATIVE
Protein, ur: NEGATIVE mg/dL
RBC / HPF: >50 RBC/hpf — ABNORMAL HIGH (ref 0–5)
Specific Gravity, Urine: 1.017 (ref 1.005–1.030)
WBC, UA: >50 WBC/hpf — ABNORMAL HIGH (ref 0–5)
pH: 5 (ref 5.0–8.0)

## 2023-01-02 LAB — LACTIC ACID, PLASMA
Lactic Acid, Venous: 2 mmol/L (ref 0.5–1.9)
Lactic Acid, Venous: 0.9 mmol/L (ref 0.5–1.9)

## 2023-01-02 LAB — COMPREHENSIVE METABOLIC PANEL
ALT: 27 U/L (ref 0–44)
AST: 22 U/L (ref 15–41)
Albumin: 3.9 g/dL (ref 3.5–5.0)
Alkaline Phosphatase: 73 U/L (ref 38–126)
Anion gap: 10 (ref 5–15)
BUN: 35 mg/dL — ABNORMAL HIGH (ref 8–23)
CO2: 24 mmol/L (ref 22–32)
Calcium: 8.9 mg/dL (ref 8.9–10.3)
Chloride: 101 mmol/L (ref 98–111)
Creatinine, Ser: 1.95 mg/dL — ABNORMAL HIGH (ref 0.61–1.24)
GFR, Estimated: 31 mL/min — ABNORMAL LOW (ref >60)
Glucose, Bld: 110 mg/dL — ABNORMAL HIGH (ref 70–99)
Potassium: 4.1 mmol/L (ref 3.5–5.1)
Sodium: 135 mmol/L (ref 135–145)
Total Bilirubin: 1 mg/dL (ref 0.3–1.2)
Total Protein: 7.2 g/dL (ref 6.5–8.1)

## 2023-01-02 LAB — PROTIME-INR
INR: 1.2 (ref 0.8–1.2)
Prothrombin Time: 15 seconds (ref 11.4–15.2)

## 2023-01-02 LAB — APTT: aPTT: 34 seconds (ref 24–36)

## 2023-01-02 MED ORDER — VITAMIN B-12 1000 MCG PO TABS
1000.0000 ug | ORAL_TABLET | ORAL | Status: DC
  Administered 2023-01-02 – 2023-01-05 (×3): 1000 ug via ORAL
  Filled 2023-01-02 (×3): qty 1

## 2023-01-02 MED ORDER — SODIUM CHLORIDE 0.9 % IV SOLN
1.0000 g | Freq: Once | INTRAVENOUS | Status: AC
  Administered 2023-01-02: 1 g via INTRAVENOUS
  Filled 2023-01-02 (×2): qty 20

## 2023-01-02 MED ORDER — MIDODRINE HCL 5 MG PO TABS: 2.5000 mg | ORAL_TABLET | Freq: Three times a day (TID) | ORAL | Status: DC | PRN

## 2023-01-02 MED ORDER — SODIUM CHLORIDE 0.9% FLUSH
3.0000 mL | Freq: Two times a day (BID) | INTRAVENOUS | Status: DC
  Administered 2023-01-02 – 2023-01-06 (×8): 3 mL via INTRAVENOUS

## 2023-01-02 MED ORDER — SODIUM CHLORIDE 0.9 % IV SOLN
INTRAVENOUS | Status: AC
  Administered 2023-01-02: 75 mL/h via INTRAVENOUS

## 2023-01-02 MED ORDER — MIDODRINE HCL 5 MG PO TABS: 2.5000 mg | ORAL_TABLET | ORAL | Status: DC | PRN

## 2023-01-02 MED ORDER — VITAMIN C 500 MG PO TABS: 1000.0000 mg | ORAL_TABLET | Freq: Every day | ORAL | Status: DC

## 2023-01-02 MED ORDER — TOPIRAMATE 25 MG PO TABS: 50.0000 mg | ORAL_TABLET | Freq: Every day | ORAL | Status: DC

## 2023-01-02 MED ORDER — ONDANSETRON HCL 4 MG/2ML IJ SOLN: 4.0000 mg | Freq: Four times a day (QID) | INTRAMUSCULAR | Status: DC | PRN

## 2023-01-02 MED ORDER — SODIUM CHLORIDE 0.9 % IV SOLN
1.0000 g | Freq: Two times a day (BID) | INTRAVENOUS | Status: AC
  Administered 2023-01-02 – 2023-01-04 (×5): 1 g via INTRAVENOUS
  Filled 2023-01-02 (×5): qty 20

## 2023-01-02 MED ORDER — SODIUM CHLORIDE 0.9 % IV BOLUS
1000.0000 mL | Freq: Once | INTRAVENOUS | Status: AC
  Administered 2023-01-02: 1000 mL via INTRAVENOUS

## 2023-01-02 MED ORDER — DULOXETINE HCL 60 MG PO CPEP
60.0000 mg | ORAL_CAPSULE | Freq: Every day | ORAL | Status: DC
  Administered 2023-01-02 – 2023-01-06 (×5): 60 mg via ORAL
  Filled 2023-01-02: qty 2
  Filled 2023-01-02 (×4): qty 1

## 2023-01-02 MED ORDER — ADULT MULTIVITAMIN W/MINERALS CH
1.0000 | ORAL_TABLET | Freq: Every day | ORAL | Status: DC
  Administered 2023-01-02 – 2023-01-06 (×5): 1 via ORAL
  Filled 2023-01-02 (×5): qty 1

## 2023-01-02 MED ORDER — CHLORHEXIDINE GLUCONATE CLOTH 2 % EX PADS
6.0000 | MEDICATED_PAD | Freq: Every day | CUTANEOUS | Status: DC
  Administered 2023-01-02 – 2023-01-06 (×5): 6 via TOPICAL

## 2023-01-02 MED ORDER — PREGABALIN 25 MG PO CAPS
50.0000 mg | ORAL_CAPSULE | Freq: Every day | ORAL | Status: DC
  Administered 2023-01-02 – 2023-01-05 (×4): 50 mg via ORAL
  Filled 2023-01-02 (×4): qty 2

## 2023-01-02 MED ORDER — LORATADINE 10 MG PO TABS
10.0000 mg | ORAL_TABLET | Freq: Every day | ORAL | Status: DC
  Administered 2023-01-02 – 2023-01-06 (×5): 10 mg via ORAL
  Filled 2023-01-02 (×5): qty 1

## 2023-01-02 MED ORDER — TOPIRAMATE 25 MG PO TABS
50.0000 mg | ORAL_TABLET | Freq: Every day | ORAL | Status: DC
  Administered 2023-01-02 – 2023-01-06 (×5): 50 mg via ORAL
  Filled 2023-01-02 (×5): qty 2

## 2023-01-02 MED ORDER — LEVOTHYROXINE SODIUM 25 MCG PO TABS
25.0000 ug | ORAL_TABLET | Freq: Every day | ORAL | Status: DC
  Administered 2023-01-03 – 2023-01-06 (×4): 25 ug via ORAL
  Filled 2023-01-02 (×4): qty 1

## 2023-01-02 MED ORDER — VITAMIN B-12 1000 MCG PO TABS: 1000.0000 ug | ORAL_TABLET | ORAL | Status: DC

## 2023-01-02 MED ORDER — POLYETHYLENE GLYCOL 3350 17 G PO PACK: 17.0000 g | PACK | Freq: Every day | ORAL | Status: DC | PRN

## 2023-01-02 MED ORDER — VITAMIN C 500 MG PO TABS
1000.0000 mg | ORAL_TABLET | Freq: Every day | ORAL | Status: DC
  Administered 2023-01-03 – 2023-01-06 (×4): 1000 mg via ORAL
  Filled 2023-01-02 (×4): qty 2

## 2023-01-02 MED ORDER — ACETAMINOPHEN 325 MG PO TABS
650.0000 mg | ORAL_TABLET | ORAL | Status: DC | PRN
  Administered 2023-01-02 – 2023-01-05 (×6): 650 mg via ORAL
  Filled 2023-01-02 (×6): qty 2

## 2023-01-02 MED ORDER — ACETAMINOPHEN 325 MG PO TABS
650.0000 mg | ORAL_TABLET | Freq: Once | ORAL | Status: AC
  Administered 2023-01-02: 650 mg via ORAL
  Filled 2023-01-02: qty 2

## 2023-01-02 MED ORDER — ONDANSETRON HCL 4 MG PO TABS: 4.0000 mg | ORAL_TABLET | Freq: Four times a day (QID) | ORAL | Status: DC | PRN

## 2023-01-02 NOTE — Progress Notes (Signed)
Most all of admission completed with assistance of son, [name].

## 2023-01-02 NOTE — ED Provider Notes (Signed)
[place] DEPT Provider Note   CSN: [id number] Arrival date & time: 01/01/23  2027     History  Chief Complaint  Patient presents with   Hematuria    Douglas Walker is a 89 y.o. male.  HPI   Patient with medical history including A-fib currently on Eliquis, CHF, chronic Foley catheter presents emerged part complaints of hematuria.  Son is at bedside and provided the majority of the HPI he states that patient recently had his catheter exchanged yesterday, patient had been passing large amount of blood into his Foley, he was reseen at [facility] urology where they flushed it vigorously and it was draining without difficulty.  The patient went to bed and then woke up unable to urinate, I he is endorsing some abdominal pain, without any nausea or vomiting, some states that the father was turned about a temperature there is been no change in mental status, at baseline he ambulates with a walker, there is open recent falls they had discontinued his Eliquis per urology's recommendations starting yesterday.  I have reviewed patient's chart patient has been admitted in the past for acute AKI as well as sepsis, most recent admission was 10/15 at that time patient was seen by infectious disease, patient has a number of complicated UTIs in the setting of BPH chronic Foley's, as well as history of ESBL, it is recommended that patient was started on meropenem for total of 10 days.    Home Medications Prior to Admission medications   Medication Sig Start Date End Date Taking? Authorizing Provider  acetaminophen (TYLENOL) 325 MG tablet Take 2 tablets (650 mg total) by mouth every 4 (four) hours as needed for mild pain (or temp > 37.5 C (99.5 F)). 02/14/21   Angiulli, Lavon Paganini, PA-C  apixaban (ELIQUIS) 2.5 MG TABS tablet Take 1 tablet (2.5 mg total) by mouth 2 (two) times daily. 09/24/22   Dunn, Nedra Hai, PA-C  Ascorbic Acid (VITAMIN C) 1000 MG tablet Take 1,000 mg by mouth  daily.    [provider]  DULoxetine (CYMBALTA) 60 MG capsule Take 1 capsule (60 mg total) by mouth daily. 04/23/21   Lovorn, Jinny Blossom, MD  erythromycin ophthalmic ointment Place 1 Application into the right eye daily as needed (for plastic conformer). 09/05/22   [provider]  furosemide (LASIX) 20 MG tablet Take 1 tablet (20 mg total) by mouth 3 (three) times a week. Take on Mondays, Wednesdays, and Fridays. 11/18/22   Belva Crome, MD  levothyroxine (SYNTHROID) 25 MCG tablet Take 1 tablet (25 mcg total) by mouth daily before breakfast. 02/14/21   Angiulli, Lavon Paganini, PA-C  loratadine (CLARITIN) 10 MG tablet Take 10 mg by mouth daily.    [provider]  magnesium hydroxide (MILK OF MAGNESIA) 400 MG/5ML suspension Take 30 mLs by mouth daily as needed for mild constipation.    [provider]  midodrine (PROAMATINE) 2.5 MG tablet Take 1 tablet (2.5 mg total) by mouth 3 (three) times daily with meals. Patient taking differently: Take 2.5 mg by mouth as needed. Take when BP is low. 10/22/22   Shawna Clamp, MD  Multiple Vitamin (MULTIVITAMIN WITH MINERALS) TABS tablet Take 1 tablet by mouth daily.    [provider]  Multiple Vitamins-Minerals (OCUVITE ADULT 50+) CAPS Take 1 capsule by mouth daily.    [provider]  nitrofurantoin, macrocrystal-monohydrate, (MACROBID) 100 MG capsule Take 1 capsule by mouth at bedtime 12/02/22   Vu, Rockey Situ, MD  polyethylene glycol (MIRALAX / GLYCOLAX) 17 g packet Take 17 g by mouth daily. Patient taking differently: Take 17 g by mouth daily as needed for mild constipation. 07/08/21   Mariel Aloe, MD  predniSONE (DELTASONE) 5 MG tablet Take 5 mg by mouth daily. 09/12/22   [provider]  pregabalin (LYRICA) 50 MG capsule Take 50 mg by mouth at bedtime.    [provider]  topiramate (TOPAMAX) 50 MG tablet Take 1 tablet (50 mg total) by mouth 2 (two) times daily. 02/27/22   Ward Givens, NP   traMADol (ULTRAM) 50 MG tablet Take 50 mg by mouth at bedtime.    [provider]  vitamin B-12 (CYANOCOBALAMIN) 1000 MCG tablet Take 1 tablet (1,000 mcg total) by mouth every other day. Patient taking differently: Take 1,000 mcg by mouth See admin instructions. 1011mg oral daily on Sunday, Tuesday, Wednesday, Thursday, repeat. 02/14/21   Angiulli, DLavon Paganini PA-C      Allergies    Sulfa antibiotics, Mysoline [primidone], and Zocor [simvastatin]    Review of Systems   Review of Systems  Constitutional:  Negative for chills and fever.  Respiratory:  Negative for shortness of breath.   Cardiovascular:  Negative for chest pain.  Gastrointestinal:  Positive for abdominal pain. Negative for diarrhea and vomiting.  Genitourinary:  Positive for difficulty urinating.  Neurological:  Negative for headaches.    Physical Exam Updated Vital Signs BP (!) 109/56   Pulse 71   Temp 98.9 F (37.2 C) (Oral)   Resp 16   Ht '5\' 9"'$  (1.753 m)   Wt 83 kg   SpO2 100%   BMI 27.02 kg/m  Physical Exam Vitals and nursing note reviewed. Exam conducted with a chaperone present.  Constitutional:      General: He is not in acute distress.    Appearance: He is not ill-appearing.  HENT:     Head: Normocephalic and atraumatic.     Comments: There is no deformity of the head present no raccoon eyes or Battle sign noted.    Nose: No congestion.     Mouth/Throat:     Comments: No trismus no torticollis no oral trauma noted Eyes:     Extraocular Movements: Extraocular movements intact.     Conjunctiva/sclera: Conjunctivae normal.     Pupils: Pupils are equal, round, and reactive to light.  Cardiovascular:     Rate and Rhythm: Normal rate and regular rhythm.     Pulses: Normal pulses.     Heart sounds: No murmur heard.    No friction rub. No gallop.  Pulmonary:     Effort: No respiratory distress.     Breath sounds: No wheezing, rhonchi or rales.  Abdominal:     Palpations: Abdomen is soft.      Tenderness: There is abdominal tenderness. There is no right CVA tenderness or left CVA tenderness.     Comments: Abdomen nondistended, soft, he has noted suprapubic distention and is tender during my examination, there is no guarding potential peritoneal sign.  Genitourinary:    Penis: Normal.      Comments: Chaperone present patient has an indwelling Foley catheter present, some blood noted coming from the tip of the penis, Foley cath has blood within it and there appears to be no fluid in the bag. Musculoskeletal:     Comments: Spine was palpated was nontender to palpation no step-off deformities noted, no pelvis instability no leg shortening  Skin:    General: Skin is warm  and dry.  Neurological:     Mental Status: He is alert.     Comments: No facial asymmetry no difficulty with word finding, following two-step commands, there is no unilateral weakness present.  Psychiatric:        Mood and Affect: Mood normal.     ED Results / Procedures / Treatments   Labs (all labs ordered are listed, but only abnormal results are displayed) Labs Reviewed  CBC - Abnormal; Notable for the following components:      Result Value   WBC 19.3 (*)    RBC 3.26 (*)    Hemoglobin 10.5 (*)    HCT 32.2 (*)    RDW 15.9 (*)    Platelets 81 (*)    All other components within normal limits  LACTIC ACID, PLASMA - Abnormal; Notable for the following components:   Lactic Acid, Venous 2.0 (*)    All other components within normal limits  COMPREHENSIVE METABOLIC PANEL - Abnormal; Notable for the following components:   Glucose, Bld 110 (*)    BUN 35 (*)    Creatinine, Ser 1.95 (*)    GFR, Estimated 31 (*)    All other components within normal limits  URINALYSIS, ROUTINE W REFLEX MICROSCOPIC - Abnormal; Notable for the following components:   APPearance HAZY (*)    Hgb urine dipstick MODERATE (*)    Leukocytes,Ua LARGE (*)    RBC / HPF >50 (*)    WBC, UA >50 (*)    Bacteria, UA RARE (*)    All  other components within normal limits  RESP PANEL BY RT-PCR (RSV, FLU A&B, COVID)  RVPGX2  CULTURE, BLOOD (ROUTINE X 2)  CULTURE, BLOOD (ROUTINE X 2)  URINE CULTURE  LACTIC ACID, PLASMA  PROTIME-INR  APTT    EKG None  Radiology CT Head Wo Contrast  Result Date: 01/02/2023 CLINICAL DATA:  Rib fractures by earlier CT.  No known injury EXAM: CT HEAD WITHOUT CONTRAST CT CERVICAL SPINE WITHOUT CONTRAST TECHNIQUE: Multidetector CT imaging of the head and cervical spine was performed following the standard protocol without intravenous contrast. Multiplanar CT image reconstructions of the cervical spine were also generated. RADIATION DOSE REDUCTION: This exam was performed according to the departmental dose-optimization program which includes automated exposure control, adjustment of the mA and/or kV according to patient size and/or use of iterative reconstruction technique. COMPARISON:  06/18/2015 head CT FINDINGS: CT HEAD FINDINGS Brain: No evidence of acute infarction, hemorrhage, hydrocephalus, extra-axial collection or mass lesion/mass effect. Chronic left frontal cortex infarct. Chronic small vessel ischemia in the cerebral white matter with patchy low-density in the deep gray nuclei, especially bilateral thalamus, likely related to the same. No chart history to indicate acute infarct. Generalized brain atrophy. Vascular: No hyperdense vessel or unexpected calcification. Skull: Negative for fracture Sinuses/Orbits: No visible injury. Right enucleation and prosthesis. CT CERVICAL SPINE FINDINGS Alignment: Normal. Skull base and vertebrae: No acute fracture.  Subjective osteopenia Soft tissues and spinal canal: No prevertebral fluid or swelling. No visible canal hematoma. Disc levels:  Ordinary degenerative changes for age. Upper chest: Negative IMPRESSION: No evidence of acute intracranial or cervical spine injury. Chronic small vessel ischemia and remote left frontal cortex infarct. Electronically  Signed   By: Jorje Guild M.D.   On: 01/02/2023 05:44   CT Cervical Spine Wo Contrast  Result Date: 01/02/2023 CLINICAL DATA:  Rib fractures by earlier CT.  No known injury EXAM: CT HEAD WITHOUT CONTRAST CT CERVICAL SPINE WITHOUT CONTRAST TECHNIQUE: Multidetector  CT imaging of the head and cervical spine was performed following the standard protocol without intravenous contrast. Multiplanar CT image reconstructions of the cervical spine were also generated. RADIATION DOSE REDUCTION: This exam was performed according to the departmental dose-optimization program which includes automated exposure control, adjustment of the mA and/or kV according to patient size and/or use of iterative reconstruction technique. COMPARISON:  06/18/2015 head CT FINDINGS: CT HEAD FINDINGS Brain: No evidence of acute infarction, hemorrhage, hydrocephalus, extra-axial collection or mass lesion/mass effect. Chronic left frontal cortex infarct. Chronic small vessel ischemia in the cerebral white matter with patchy low-density in the deep gray nuclei, especially bilateral thalamus, likely related to the same. No chart history to indicate acute infarct. Generalized brain atrophy. Vascular: No hyperdense vessel or unexpected calcification. Skull: Negative for fracture Sinuses/Orbits: No visible injury. Right enucleation and prosthesis. CT CERVICAL SPINE FINDINGS Alignment: Normal. Skull base and vertebrae: No acute fracture.  Subjective osteopenia Soft tissues and spinal canal: No prevertebral fluid or swelling. No visible canal hematoma. Disc levels:  Ordinary degenerative changes for age. Upper chest: Negative IMPRESSION: No evidence of acute intracranial or cervical spine injury. Chronic small vessel ischemia and remote left frontal cortex infarct. Electronically Signed   By: Jorje Guild M.D.   On: 01/02/2023 05:44   CT ABDOMEN PELVIS WO CONTRAST  Result Date: 01/02/2023 CLINICAL DATA:  Sepsis.  Gross hematuria. EXAM: CT ABDOMEN  AND PELVIS WITHOUT CONTRAST TECHNIQUE: Multidetector CT imaging of the abdomen and pelvis was performed following the standard protocol without IV contrast. RADIATION DOSE REDUCTION: This exam was performed according to the departmental dose-optimization program which includes automated exposure control, adjustment of the mA and/or kV according to patient size and/or use of iterative reconstruction technique. COMPARISON:  10/14/2022. FINDINGS: Lower chest: Heart is enlarged and coronary artery calcifications are noted. Pacemaker leads are noted in the heart. Mild atelectasis is present at the lung bases. There is a 5 mm nodule in the left lower lobe, axial image 31, unchanged. A 4 mm nodule is present in the left lower lobe, axial image 35, unchanged. Hepatobiliary: Stable subcentimeter hypodense lesion in the left lobe of the liver. The gallbladder is without stones. No biliary ductal dilatation. Pancreas: Unremarkable. No pancreatic ductal dilatation or surrounding inflammatory changes. Spleen: Normal in size without focal abnormality. Adrenals/Urinary Tract: The adrenal glands are within normal limits. Bilateral renal cysts are noted. No renal calculus or hydronephrosis. A Foley catheter is present in the urinary bladder. There is a diverticulum anterior to the urinary bladder there is bladder wall thickening with perivesicular fat stranding. Stomach/Bowel: Stomach is within normal limits. Appendix is not seen. No evidence of bowel wall thickening, distention, or inflammatory changes. No free air or pneumatosis. Scattered diverticula are present along the colon without evidence of diverticulitis. Vascular/Lymphatic: Aortic atherosclerosis. No enlarged abdominal or pelvic lymph nodes. Reproductive: The prostate gland is normal in size. Other: No abdominopelvic ascites. There is a fat containing inguinal hernia on the left. Musculoskeletal: There are fractures of the T8 and T9 ribs on the right which is new from  the previous exam. Degenerative changes are present in the thoracolumbar spine. IMPRESSION: 1. Foley catheter in the urinary bladder. There is a bladder diverticulum anteriorly. Perivesicular fat stranding is present suggesting infectious or inflammatory cystitis. 2. No renal calculus or hydronephrosis hydronephrosis bilaterally. 3. Stable left lower lobe pulmonary nodules measuring 5 mm. No follow-up needed if patient is low-risk.This recommendation follows the consensus statement: Guidelines for Management of Incidental Pulmonary Nodules Detected  on CT Images: From the Fleischner Society 2017; Radiology 2017; 284:228-243. 4. Rib fractures at T8 and T9 on the right which are new from the previous exam. Clinical correlation is recommended. 5. Aortic atherosclerosis. Electronically Signed   By: Brett Fairy M.D.   On: 01/02/2023 04:11   DG Chest Port 1 View  Result Date: 01/02/2023 CLINICAL DATA:  Possible sepsis EXAM: PORTABLE CHEST 1 VIEW COMPARISON:  10/13/2022 FINDINGS: Cardiac shadow is enlarged. Pacing device is again seen. Tortuous thoracic aorta is noted with atherosclerotic calcifications. Vascular congestion is noted without definitive interstitial edema. No bony abnormality is noted. IMPRESSION: Mild CHF without significant edema. Electronically Signed   By: Inez Catalina M.D.   On: 01/02/2023 01:33    Procedures .Critical Care  Performed by: Marcello Fennel, PA-C Authorized by: Marcello Fennel, PA-C   Critical care provider statement:    Critical care time (minutes):  30   Critical care was necessary to treat or prevent imminent or life-threatening deterioration of the following conditions:  Sepsis   Critical care was time spent personally by me on the following activities:  Development of treatment plan with patient or surrogate, discussions with consultants, evaluation of patient's response to treatment, examination of patient, ordering and review of laboratory studies, ordering and  review of radiographic studies, ordering and performing treatments and interventions, pulse oximetry, re-evaluation of patient's condition and review of old charts   I assumed direction of critical care for this patient from another provider in my specialty: no     Care discussed with: admitting provider       Medications Ordered in ED Medications  meropenem (MERREM) 1 g in sodium chloride 0.9 % 100 mL IVPB (has no administration in time range)  sodium chloride 0.9 % bolus 1,000 mL (0 mLs Intravenous Stopped 01/02/23 0309)  acetaminophen (TYLENOL) tablet 650 mg (650 mg Oral Given 01/02/23 0207)    ED Course/ Medical Decision Making/ A&P                           Medical Decision Making Amount and/or Complexity of Data Reviewed Labs: ordered. Radiology: ordered. ECG/medicine tests: ordered.  Risk OTC drugs. Decision regarding hospitalization.   This patient presents to the ED for concern of hematuria, this involves an extensive number of treatment options, and is a complaint that carries with it a high risk of complications and morbidity.  The differential diagnosis includes UTI, sepsis, postrenal obstruction    Additional history obtained:  Additional history obtained from son at bedside External records from outside source obtained and reviewed including recent hospital patients, infection disease   Co morbidities that complicate the patient evaluation  CHF, currently on anticoag, chronic indwelling Foley  Social Determinants of Health:  N/A    Lab Tests:  I Ordered, and personally interpreted labs.  The pertinent results include: CBC shows leukocytosis with a white count 19.3, stable normocytic anemia hemoglobin 10.5, CMP shows glucose of 110 BUN 35 creatinine baseline at 1.9, prothrombin time INR unremarkable APTT unremarkable, lactic 2.0, respiratory panel negative   Imaging Studies ordered:  I ordered imaging studies including chest x-ray, CT AP I independently  visualized and interpreted imaging which showed chest x-ray shows mild CHF without Simeon's pleural effusions, CT imaging reveals Foley catheter in place with inflamed formation around the bladder concerning for cystitis, new rib fractures T8-T9 I agree with the radiologist interpretation   Cardiac Monitoring:  The patient was maintained  on a cardiac monitor.  I personally viewed and interpreted the cardiac monitored which showed an underlying rhythm of: Without signs of ischemia   Medicines ordered and prescription drug management:  I ordered medication including fluids I have reviewed the patients home medicines and have made adjustments as needed  Critical Interventions:  My evaluation patient was hypotensive with an oral temperature of 101, patient's had UTIs in the past I suspect this is likely source will activate code sepsis Patient is reassessed BP has stabilized we will continue to monitor Second lactic has improved to 0.9   Reevaluation:  Patient was able to flush patient's catheter, is flushed without difficulty, we are waiting for pharmacy to bring down meropenem.  Patient has received a 1 L of fluids, due to history of CHF we will not volume overload at this time.  Will await CT abdomen pelvis for further evaluation  CT imaging reveals new rib fractures, I assessed the patient he is nontender during my evaluation but with these new findings and being on a blood thinner I am concerned for possible head trauma/intracranial bleed will send down for CT head as well as C-spine for further evaluation his back was nontender and I do not feel CT imaging of the remainder of his back is necessary at this time  CT imaging of the head and C-spine both negative acute findings, patient has remained stable, will consult hospitalist for admission.   Consultations Obtained:  I requested consultation with the hospitalist Dr.  Alcario Drought,  and discussed lab and imaging findings as well as  pertinent plan - they recommend: He is currently capped this time and will have oncoming day team admit the patient.    Test Considered:  N/A    Rule out Suspicion for intracranial head bleed is low at this time as CT imaging is negative.  Doubt CVA  as he has no focal deficit present my exam.  Suspicion for bowel obstruction, volvulus, diverticulitis, kidney stone, AAA, cirrhosis, CT imaging is negative.  Doubt patient has posterior renal obstruction as his abdomen is soft nontender, there is no suprapubic distention present, Foley catheter changed this time.  My suspicion for new intrathoracic trauma is low at this time as his chest is nontender to palpation and there is no evident trauma on my exam.  I doubt spinal fracture spine was nontender to palpation he is moving upper and lower extremities without difficulty.    Dispostion and problem list  After consideration of the diagnostic results and the patients response to treatment, I feel that the patent would benefit from admission.   Urosepsis-patient was started on meropenem this is a history of ESBL and has complex UTIs with chronic indwelling catheter, patient recommended follow-up on urine cultures and possibly consult with ID for further recommendations Hematuria-suspect multifactorial previous trauma from changing catheter 2 days ago as well as acute cystitis, if patient continues to hematuria formal consultation by urology might be necessary.  Patient stopped his Eliquis due to hematuria Legrand [place]o need to be placed back on its upon discharge Rib fractures-suspect these are more subacute as on my exam he was nontender to palpation, but I would recommend incentive spirometry for decrease risk of pneumonia             Final Clinical Impression(s) / ED Diagnoses Final diagnoses:  Sepsis, due to unspecified organism, unspecified whether acute organ dysfunction present ([place])  Gross hematuria  Acute cystitis without hematuria   Closed fracture of multiple ribs of  right side, initial encounter    Rx / DC Orders ED Discharge Orders     None         Marcello Fennel, PA-C 01/02/23 [id number]    Maudie Flakes, MD 01/03/23 [phone]

## 2023-01-02 NOTE — Progress Notes (Signed)
A consult was received from an ED physician for meropenem per pharmacy dosing.  The patient's profile has been reviewed for ht/wt/allergies/indication/available labs.    A one time order has been placed for Meropenem 1gm IV.    Further antibiotics/pharmacy consults should be ordered by admitting physician if indicated.                       Thank you, Everette Rank, PharmD 01/02/2023  1:24 AM

## 2023-01-02 NOTE — ED Notes (Signed)
Unable to find bladder on scan

## 2023-01-02 NOTE — ED Notes (Signed)
Foley irrigated with return of saline noted Leg bag changed to a standard drainage bag.

## 2023-01-02 NOTE — ED Notes (Signed)
Unable to detect bladder with bladder scanner.

## 2023-01-02 NOTE — ED Notes (Signed)
Patient transported to CT 

## 2023-01-02 NOTE — Sepsis Progress Note (Signed)
Following for sepsis monitoring ?

## 2023-01-02 NOTE — H&P (Addendum)
History and Physical    Patient: Douglas Walker XBJ:[id number] DOB: 1925-11-12 DOA: 01/01/2023 DOS: the patient was seen and examined on 01/02/2023 PCP: Corliss Blacker, MD  Patient coming from: Home  Chief Complaint:  Chief Complaint  Patient presents with   Hematuria   HPI: Douglas Walker is a 89 y.o. male with medical history significant of chronic diastolic heart failure/pulmonary hypertension, atrial fibrillation previously on Eliquis, chronic kidney disease stage III, essential tremor primarily involving the lips/oral musculature, GERD and hiatal hernia, dyslipidemia, hypertension, neuropathy, history of prior CVA, history of tachybradycardia syndrome status post pacemaker, and BPH requiring chronic Foley catheter with a history of subsequent recurrent UTIs.  Occasionally some of these organisms have been multidrug-resistant and difficult to treat.  Patient has been followed by infectious disease service in the past noting he has been on Macrobid for 3 months in 2022.  Last admission was in October 2023 outpatient follow-up with ID on 11/05/22 which time a decision was made to continue chronic Macrobid.  Patient presented to the ER with significant gross hematuria seen in the Foley catheter drainage bag.  He initially was evaluated at the urology office where the catheter was irrigated vigorously.  He subsequently awakened in the middle of the night because he was unable to pass urine through the catheter.  This was associated with some abdominal pain and nausea.  Because of the hematuria and recent falls the urologist recommended discontinuing Eliquis which was done on 11/3.  Patient's urinalysis was concerning for another recurrent UTI and given his history of multidrug-resistant organisms including ESBL he was started on meropenem by the EDP.  Addition patient was treated as evolving sepsis and had a Tmax of 101 F and had suboptimal blood pressures with a mildly elevated lactic acid  but improved after IV fluid hydration.  Upon my evaluation of the patient it is noted that the urine has now clear to yellow in color.  He reports that he is not very hungry that he is no longer having any abdominal discomfort.  Plan is to admit the patient for further evaluation and treatment.   Review of Systems: As mentioned in the history of present illness. All other systems reviewed and are negative. Past Medical History:  Diagnosis Date   Anemia    Arthritis    shoulders and back   Cancer (HCC)    skin cancers   Chronic diastolic (congestive) heart failure (HCC)    Chronic low back pain 12/15/2017   CKD (chronic kidney disease) stage 3, GFR 30-59 ml/min (HCC)    Essential tremor 02/28/2016   GERD (gastroesophageal reflux disease)    Hernia    History of hiatal hernia    Hyperlipemia    Hypertension    Macular degeneration    Neuromuscular disorder (HCC)    neuropathy   Paroxysmal atrial fibrillation ([place]) 10/28/2013   Presence of permanent cardiac pacemaker 12/04/2018   Pulmonary hypertension ([place])    Stroke ([place]) 12/2020   Tachycardia-bradycardia syndrome ([place]) 09/13/2014   Thoracic aortic aneurysm (HCC)    Thrombocytopenia (HCC)    TIA (transient ischemic attack)    Past Surgical History:  Procedure Laterality Date   [place] / REPLACE / REMOVE PACEMAKER  12/04/2018   IRRIGATION AND DEBRIDEMENT ABSCESS Right 07/24/2020   Procedure: IRRIGATION AND DEBRIDEMENT HEMATOMA;  Surgeon: Coralie Keens, MD;  Location: [facility];  Service: General;  Laterality: Right;   MASS EXCISION Right 07/20/2020   Procedure: EXCISION OF RIGHT CHEST WALL MASS;  Surgeon: Stark Klein, MD;  Location: [facility];  Service: General;  Laterality: Right;   PACEMAKER IMPLANT N/A 12/04/2018   Procedure: PACEMAKER IMPLANT;  Surgeon: Evans Lance, MD;  Location: [facility];  Service: Cardiovascular;  Laterality: N/A;    Social History:  reports that he quit smoking about 60 years ago. His smoking use included cigarettes. He has never used smokeless tobacco. He reports that he does not drink alcohol and does not use drugs.  Allergies  Allergen Reactions   Sulfa Antibiotics Other (See Comments)    Weakness Dizziness  Sweats    Mysoline [Primidone] Other (See Comments)    Sedation    Zocor [Simvastatin] Other (See Comments)    Arthralgias Fatigue    Family History  Problem Relation Age of Onset   GI problems Mother    Other Sister        PAIN ISSUES   Hearing loss Sister    Blindness Sister    Heart attack Neg Hx    Stroke Neg Hx     Prior to Admission medications   Medication Sig Start Date End Date Taking? Authorizing Provider  acetaminophen (TYLENOL) 325 MG tablet Take 2 tablets (650 mg total) by mouth every 4 (four) hours as needed for mild pain (or temp > 37.5 C (99.5 F)). 02/14/21   Angiulli, Lavon Paganini, PA-C  apixaban (ELIQUIS) 2.5 MG TABS tablet Take 1 tablet (2.5 mg total) by mouth 2 (two) times daily. 09/24/22   Dunn, Nedra Hai, PA-C  Ascorbic Acid (VITAMIN C) 1000 MG tablet Take 1,000 mg by mouth daily.    [provider]  DULoxetine (CYMBALTA) 60 MG capsule Take 1 capsule (60 mg total) by mouth daily. 04/23/21   Lovorn, Jinny Blossom, MD  erythromycin ophthalmic ointment Place 1 Application into the right eye daily as needed (for plastic conformer). 09/05/22   [provider]  furosemide (LASIX) 20 MG tablet Take 1 tablet (20 mg total) by mouth 3 (three) times a week. Take on Mondays, Wednesdays, and Fridays. 11/18/22   Belva Crome, MD  levothyroxine (SYNTHROID) 25 MCG tablet Take 1 tablet (25 mcg total) by mouth daily before breakfast. 02/14/21   Angiulli, Lavon Paganini, PA-C  loratadine (CLARITIN) 10 MG tablet Take 10 mg by mouth daily.    [provider]  magnesium hydroxide (MILK OF MAGNESIA) 400 MG/5ML suspension Take 30 mLs by mouth daily as needed for mild  constipation.    [provider]  midodrine (PROAMATINE) 2.5 MG tablet Take 1 tablet (2.5 mg total) by mouth 3 (three) times daily with meals. Patient taking differently: Take 2.5 mg by mouth as needed. Take when BP is low. 10/22/22   Shawna Clamp, MD  Multiple Vitamin (MULTIVITAMIN WITH MINERALS) TABS tablet Take 1 tablet by mouth daily.    [provider]  Multiple Vitamins-Minerals (OCUVITE ADULT 50+) CAPS Take 1 capsule by mouth daily.    [provider]  nitrofurantoin, macrocrystal-monohydrate, (MACROBID) 100 MG capsule Take 1 capsule by mouth at bedtime 12/02/22   Vu, Trung T, MD  polyethylene glycol (MIRALAX / GLYCOLAX) 17 g packet Take 17 g by mouth daily. Patient taking differently: Take 17 g by mouth daily as needed for mild constipation. 07/08/21   Mariel Aloe, MD  predniSONE (DELTASONE) 5 MG tablet Take 5 mg by mouth daily.  09/12/22   [provider]  pregabalin (LYRICA) 50 MG capsule Take 50 mg by mouth at bedtime.    [provider]  topiramate (TOPAMAX) 50 MG tablet Take 1 tablet (50 mg total) by mouth 2 (two) times daily. 02/27/22   Ward Givens, NP  traMADol (ULTRAM) 50 MG tablet Take 50 mg by mouth at bedtime.    [provider]  vitamin B-12 (CYANOCOBALAMIN) 1000 MCG tablet Take 1 tablet (1,000 mcg total) by mouth every other day. Patient taking differently: Take 1,000 mcg by mouth See admin instructions. 1060mg oral daily on Sunday, Tuesday, Wednesday, Thursday, repeat. 02/14/21   ACathlyn Parsons PA-C    Physical Exam: Vitals:   01/02/23 1130 01/02/23 1142 01/02/23 1143 01/02/23 1200  BP: 129/69   125/65  Pulse: 69 70 69 70  Resp: (!) '21 20 18 16  '$ Temp:      TempSrc:      SpO2: 90% 95% 94% 93%  Weight:      Height:       Constitutional: NAD, calm, comfortable Eyes: PERRL, lids and conjunctivae normal ENMT: Mucous membranes are moist. Posterior pharynx clear of any exudate or lesions. Neck: normal,  supple, no masses, no thyromegaly Respiratory: clear to auscultation bilaterally, no wheezing, no crackles. Normal respiratory effort. No accessory muscle use.  Room air Cardiovascular: Regular rate and rhythm, no murmurs / rubs / gallops. No extremity edema. 2+ pedal pulses.   Abdomen: no tenderness, no masses palpated. No hepatosplenomegaly. Bowel sounds positive.  Genitourinary: Foley catheter in place draining clear yellow urine to the bedside bag Musculoskeletal: no clubbing / cyanosis. No joint deformity upper and lower extremities. Good ROM, no contractures. Normal muscle tone.  Skin: no rashes, lesions, ulcers. No induration Neurologic: CN 2-12 grossly intact except for notable hearing loss.  Patient does have significant benign tremor involving the lips.  Sensation intact, DTR normal. Strength 4/5 x all 4 extremities.  Psychiatric:Alert and oriented x name and place.  Normal mood.   Data Reviewed:  As per HPI. Sodium 135, potassium 4.1, BUN 35 creatinine 1.95, lactic acid 2.0 and after hydration decreased to 0.9, initial white count was 19,300 with a hemoglobin of 10.5 and platelets 81,000; COVID, flu, RSV PCR negative  Urinalysis significantly abnormal with moderate hemoglobin, large leukocytes, rare bacteria, greater than 50 RBC, greater than 50 WBC  Blood and urine cultures are pending  Portable chest x-ray CHF without significant edema  CT abdomen and pelvis: Foley catheter and urinary bladder with a bladder diverticulum anteriorly perivesicular fat stranding is present suggesting infectious or inflammatory cystitis.  Stable left lower lobe pulmonary nodule no follow-up needed if patient low risk.  CT head and cervical spine without contrast no acute intracranial cervical spine injury  Assessment and Plan: Gross hematuria/chronic Foley catheter Patient without any hematuria in urinary collection bag Continue to hold Eliquis If hematuria recurs will need to consult  urology  Abnormal urinalysis concerning for recurrent UTI History of multidrug-resistant organisms by ID both in the inpatient and outpatient settings Will begin empiric meropenem and follow-up on urine and blood cultures Consider ID consultation based on urine and or blood culture results  Chronic diastolic heart failure with associated pulmonary hypertension [place] demonstrated mild CHF without significant edema. Patient is stable on room air  Atrial fibrillation/history of TBS status post pacemaker Currently rate controlled noting was not on rate control medications prior to admission And recurrent falls and recent gross hematuria urology has stopped the Eliquis and  we will not order Eliquis or other anticoagulation at this time  Chronic thrombocytopenia Etiology unclear Utilizing SCDs currently given recent hematuria Previous range for platelets had been between 80 and 100,000 and recently have dropped to as low as 50,000 this past October but this was in the setting of sepsis physiology  Reported frequent falls/history of neuropathy Patient ambulates with a walker at home Obtain PT evaluation Continue Lyrica  Pulmonary nodules No follow-up indicated if low risk  Addendum: he is alert, oriented, some chronic tremor, poor eyesight due to macular degeneration, son reports he needs to be fed due to poor eyesight, he has no trouble swallowing  Ct scan showed right side rib fracture, he denies falls recently, reports some chronic right side pain, no now pain, no sob  Foley was last changed on 1/2 at [facility]rology Son reports patient has one day of hallucination at the end of meropenem infusion last hospitalization, will monitor, delirium precaution, appear dehydrated, continue ivf  Baseline lives at home walks with a walker at home, wheelchaiir when goes to the doctor's office, son desires Pt eval, order placed    Advance Care Planning:   Code Status: DNR   VTE prophylaxis:  SCDs  Consults: None  Family Communication: No one at bedside  Severity of Illness: The appropriate patient status for this patient is INPATIENT. Inpatient status is judged to be reasonable and necessary in order to provide the required intensity of service to ensure the patient's safety. The patient's presenting symptoms, physical exam findings, and initial radiographic and laboratory data in the context of their chronic comorbidities is felt to place them at high risk for further clinical deterioration. Furthermore, it is not anticipated that the patient will be medically stable for discharge from the hospital within 2 midnights of admission.   * I certify that at the point of admission it is my clinical judgment that the patient will require inpatient hospital care spanning beyond 2 midnights from the point of admission due to high intensity of service, high risk for further deterioration and high frequency of surveillance required.*  Author: Erin Hearing, NP 01/02/2023 12:12 PM  For on call review [email or web address].

## 2023-01-03 DIAGNOSIS — R310 Gross hematuria: Secondary | ICD-10-CM | POA: Diagnosis not present

## 2023-01-03 DIAGNOSIS — Z978 Presence of other specified devices: Secondary | ICD-10-CM | POA: Diagnosis not present

## 2023-01-03 DIAGNOSIS — N179 Acute kidney failure, unspecified: Secondary | ICD-10-CM | POA: Diagnosis not present

## 2023-01-03 LAB — COMPREHENSIVE METABOLIC PANEL
ALT: 26 U/L (ref 0–44)
AST: 20 U/L (ref 15–41)
Albumin: 3.5 g/dL (ref 3.5–5.0)
Alkaline Phosphatase: 72 U/L (ref 38–126)
Anion gap: 8 (ref 5–15)
BUN: 29 mg/dL — ABNORMAL HIGH (ref 8–23)
CO2: 23 mmol/L (ref 22–32)
Calcium: 8.5 mg/dL — ABNORMAL LOW (ref 8.9–10.3)
Chloride: 109 mmol/L (ref 98–111)
Creatinine, Ser: 1.56 mg/dL — ABNORMAL HIGH (ref 0.61–1.24)
GFR, Estimated: 41 mL/min — ABNORMAL LOW (ref >60)
Glucose, Bld: 93 mg/dL (ref 70–99)
Potassium: 3.9 mmol/L (ref 3.5–5.1)
Sodium: 140 mmol/L (ref 135–145)
Total Bilirubin: 0.6 mg/dL (ref 0.3–1.2)
Total Protein: 6.2 g/dL — ABNORMAL LOW (ref 6.5–8.1)

## 2023-01-03 LAB — CBC
HCT: 31.5 % — ABNORMAL LOW (ref 39.0–52.0)
Hemoglobin: 10.3 g/dL — ABNORMAL LOW (ref 13.0–17.0)
MCH: 32 pg (ref 26.0–34.0)
MCHC: 32.7 g/dL (ref 30.0–36.0)
MCV: 97.8 fL (ref 80.0–100.0)
Platelets: 66 10*3/uL — ABNORMAL LOW (ref 150–400)
RBC: 3.22 MIL/uL — ABNORMAL LOW (ref 4.22–5.81)
RDW: 16.2 % — ABNORMAL HIGH (ref 11.5–15.5)
WBC: 9.5 10*3/uL (ref 4.0–10.5)
nRBC: 0 % (ref 0.0–0.2)

## 2023-01-03 LAB — MAGNESIUM: Magnesium: 2.3 mg/dL (ref 1.7–2.4)

## 2023-01-03 LAB — PHOSPHORUS: Phosphorus: 3.2 mg/dL (ref 2.5–4.6)

## 2023-01-03 NOTE — Progress Notes (Addendum)
Foley switch will be delayed until morning time when urology may be available for assistance.   Per [facility], we can delay insertion.

## 2023-01-03 NOTE — Progress Notes (Addendum)
PROGRESS NOTE    Douglas Walker  BSJ:[id number] DOB: [birth year] DOA: 01/01/2023 PCP: Corliss Blacker, MD     Brief Narrative:  Douglas Walker is a 89 y.o. male with medical history significant of chronic diastolic heart failure/pulmonary hypertension, history of tachybradycardia syndrome status post pacemaker, h/o atrial fibrillation  off anticoagulation due to recurrent hematuria and fall risk per chart review, h/o CVA, essential tremor primarily involving the lips/oral musculature, neuropathy, ,chronic kidney disease stage III, GERD and hiatal hernia, and BPH requiring chronic Foley catheter with a history of subsequent recurrent UTIs.  Occasionally some of these organisms have been multidrug-resistant and difficult to treat. Last admission was in October 2023 outpatient follow-up with ID on 11/05/22 which time a decision was made to continue chronic Macrobid. He presents to the ED due to hematuria, Tmax of 101 F and had suboptimal blood pressures with a mildly elevated lactic acid   Son reports he had foley exchanged on 1/2 at urology's office where the catheter was irrigated vigorously. He subsequently awakened in the middle of the night because he was unable to pass urine through the catheter. This was associated with some abdominal pain and nausea.      Subjective:  Denies pain, no fever, he reports walked with Pt today Wbc and Cr are improving  Assessment & Plan:  Principal Problem:   Hematuria Active Problems:   AKI (acute kidney injury) ([place])   FTT (failure to thrive) in adult    Assessment and Plan:  Gross hematuria/chronic Foley catheter/Abnormal urinalysis concerning for recurrent UTI/AKI Patient without any hematuria in urinary collection bag Continue to hold Eliquis, on abx, f/u on urine culture  If hematuria recurs will need to consult urology    Chronic diastolic heart failure with associated pulmonary hypertension Cxr " Vascular congestion is noted  without definitive interstitial edema " Patient is stable on room air, no sob, no edema Close monitor volume status    Atrial fibrillation/history of TBS status post pacemaker Currently rate controlled noting was not on rate control medications prior to admission And recurrent falls and recent gross hematuria urology has stopped the Eliquis and we will not order Eliquis or other anticoagulation at this time   Chronic thrombocytopenia Etiology unclear Utilizing SCDs currently given recent hematuria Previous range for platelets had been between 80 and 100,000 and recently have dropped to as low as 50,000 this past October but this was in the setting of sepsis physiology   Reported frequent falls/history of neuropathy Patient ambulates with a walker at home Seen by PT evaluation who recommended home health Continue Lyrica   Pulmonary nodules No follow-up indicated if low risk  Poor vision, need to be fed, fall precaution        I have Reviewed nursing notes, Vitals, pain scores, I/o's, Lab results and  imaging results since pt's last encounter, details please see discussion above  I ordered the following labs:  Unresulted Labs (From admission, onward)     Start     Ordered   01/04/23 0500  CBC with Differential/Platelet  Tomorrow morning,   R       Question:  Specimen collection method  Answer:  Lab=Lab collect   01/03/23 1650   01/04/23 7654  Basic metabolic panel  Daily,   R     Question:  Specimen collection method  Answer:  Lab=Lab collect   01/03/23 1650             DVT prophylaxis: SCDs Start:  01/02/23 1159   Code Status:   Code Status: DNR  Family Communication: son at bedside on 1/4 Disposition:   Dispo: The patient is from: home              Anticipated d/c is to: home with home health              Anticipated d/c date is: 24-48hrs  Antimicrobials:    Anti-infectives (From admission, onward)    Start     Dose/Rate Route Frequency Ordered Stop    01/02/23 2200  meropenem (MERREM) 1 g in sodium chloride 0.9 % 100 mL IVPB        1 g 200 mL/hr over 30 Minutes Intravenous Every 12 hours 01/02/23 1637 01/05/23 0959   01/02/23 0130  meropenem (MERREM) 1 g in sodium chloride 0.9 % 100 mL IVPB        1 g 200 mL/hr over 30 Minutes Intravenous  Once 01/02/23 0124 01/02/23 0804          Objective: Vitals:   01/02/23 2215 01/03/23 0149 01/03/23 0447 01/03/23 0847  BP: (!) 158/79 114/64 136/70 (!) 153/71  Pulse: 70 70 70 70  Resp: '18 18 18 18  '$ Temp: 99 F (37.2 C) 98.4 F (36.9 C) 97.9 F (36.6 C) 98.3 F (36.8 C)  TempSrc: Oral Oral Oral Oral  SpO2: 96% 91% 92% 92%  Weight:      Height:        Intake/Output Summary (Last 24 hours) at 01/03/2023 1650 Last data filed at 01/03/2023 0930 Gross per 24 hour  Intake 946.97 ml  Output 1325 ml  Net -378.03 ml   Filed Weights   01/01/23 2036  Weight: 83 kg    Examination:  General exam: alert, awake, communicative, tremors Respiratory system: Clear to auscultation. Respiratory effort normal. Cardiovascular system:  RRR.  Gastrointestinal system: Abdomen is nondistended, soft and nontender.  Normal bowel sounds heard. Central nervous system: Alert and oriented. No focal neurological deficits. Extremities:  no edema Skin: No rashes, lesions or ulcers Psychiatry: Judgement and insight appear normal. Mood & affect appropriate.     Data Reviewed: I have personally reviewed  labs and visualized  imaging studies since the last encounter and formulate the plan        Scheduled Meds:  ascorbic acid  1,000 mg Oral Daily   Chlorhexidine Gluconate Cloth  6 each Topical Daily   cyanocobalamin  1,000 mcg Oral Once per day on Sun Tue Thu Sat   DULoxetine  60 mg Oral Daily   levothyroxine  25 mcg Oral QAC breakfast   loratadine  10 mg Oral Daily   multivitamin with minerals  1 tablet Oral Daily   pregabalin  50 mg Oral QHS   sodium chloride flush  3 mL Intravenous Q12H    topiramate  50 mg Oral Daily   Continuous Infusions:  sodium chloride 75 mL/hr at 01/03/23 0524   meropenem (MERREM) IV 1 g (01/03/23 0912)     LOS: 1 day     Florencia Reasons, MD PhD FACP [facility]  Available via Epic secure chat 7am-7pm for nonurgent issues Please page for urgent issues To page the attending provider between 7A-7P or the covering provider during after hours 7P-7A, please log into the web site [email or web address] and access using universal [place] password for that web site. If you do not have the password, please call the hospital operator.    01/03/2023, 4:50 PM

## 2023-01-03 NOTE — Evaluation (Signed)
Physical Therapy Evaluation Patient Details Name: Douglas Walker MRN: [id number] DOB: 06/23/1925 Today's Date: 01/03/2023  History of Present Illness  89 y.o. male with medical history significant of chronic diastolic heart failure/pulmonary hypertension, atrial fibrillation previously on Eliquis, chronic kidney disease stage III, essential tremor primarily involving the lips/oral musculature, GERD and hiatal hernia, dyslipidemia, hypertension, neuropathy, history of prior CVA, history of tachybradycardia syndrome status post pacemaker, and BPH requiring chronic Foley catheter with a history of subsequent recurrent UTIs. Patient presented to the ER with significant gross hematuria seen in the Foley catheter drainage bag, concern for recurrent UTI, and has had several falls.  Clinical Impression  Pt admitted with above diagnosis. Pt ambulated 69' with RW, no loss of balance, distance limited by fatigue. Pt reports he has 24 hour assist available at home, where he lives with his son. He requires assistance for feeding due to poor vision (blind R eye) and BUE tremors.  Pt currently with functional limitations due to the deficits listed below (see PT Problem List). Pt will benefit from skilled PT to increase their independence and safety with mobility to allow discharge to the venue listed below.          Recommendations for follow up therapy are one component of a multi-disciplinary discharge planning process, led by the attending physician.  Recommendations may be updated based on patient status, additional functional criteria and insurance authorization.  Follow Up Recommendations Home health PT      Assistance Recommended at Discharge Intermittent Supervision/Assistance  Patient can return home with the following  A little help with walking and/or transfers;A little help with bathing/dressing/bathroom;Assistance with cooking/housework;Assist for transportation;Direct supervision/assist for  medications management;Assistance with feeding    Equipment Recommendations None recommended by PT  Recommendations for Other Services       Functional Status Assessment Patient has had a recent decline in their functional status and demonstrates the ability to make significant improvements in function in a reasonable and predictable amount of time.     Precautions / Restrictions Precautions Precautions: Fall;Other (comment) Precaution Comments: h/o recent falls per H&P; poor vision, blind R eye, tremors (needs assist to eat) Restrictions Weight Bearing Restrictions: No      Mobility  Bed Mobility Overal bed mobility: Modified Independent Bed Mobility: Supine to Sit     Supine to sit: Modified independent (Device/Increase time), HOB elevated     General bed mobility comments: HOB up    Transfers Overall transfer level: Needs assistance   Transfers: Sit to/from Stand Sit to Stand: Min guard                Ambulation/Gait Ambulation/Gait assistance: Min guard Gait Distance (Feet): 90 Feet Assistive device: Rolling walker (2 wheels) Gait Pattern/deviations: Step-through pattern, Decreased stride length Gait velocity: decr     General Gait Details: min/guard for safety 2* h/o falls, no loss of balance  Stairs            Wheelchair Mobility    Modified Rankin (Stroke Patients Only)       Balance Overall balance assessment: Needs assistance, History of Falls   Sitting balance-Leahy Scale: Good     Standing balance support: Bilateral upper extremity supported, During functional activity, Reliant on assistive device for balance Standing balance-Leahy Scale: Fair                               Pertinent Vitals/Pain Pain Assessment Breathing: normal Negative  Vocalization: none Facial Expression: smiling or inexpressive Body Language: relaxed Consolability: no need to console PAINAD Score: 0    Home Living Family/patient expects  to be discharged to:: Private residence Living Arrangements: Children Available Help at Discharge: Available 24 hours/day Type of Home: House Home Access: Level entry       Home Layout: One level Home Equipment: Conservation officer, nature (2 wheels);Shower seat;Grab bars - tub/shower Additional Comments: lives with son, DIL, grandson. "someone is always there with me"    Prior Function Prior Level of Function : Independent/Modified Independent;History of Falls (last six months)             Mobility Comments: Used RW at [employer] I ADLs Comments: mod I     Hand Dominance   Dominant Hand: Right    Extremity/Trunk Assessment   Upper Extremity Assessment Upper Extremity Assessment: Overall WFL for tasks assessed    Lower Extremity Assessment Lower Extremity Assessment: Overall WFL for tasks assessed    Cervical / Trunk Assessment Cervical / Trunk Assessment: Kyphotic  Communication   Communication: HOH  Cognition Arousal/Alertness: Awake/alert Behavior During Therapy: WFL for tasks assessed/performed Overall Cognitive Status: Within Functional Limits for tasks assessed                                          General Comments      Exercises     Assessment/Plan    PT Assessment Patient needs continued PT services  PT Problem List Decreased activity tolerance;Decreased mobility;Decreased balance       PT Treatment Interventions Gait training;Therapeutic exercise;Patient/family education;Therapeutic activities;Balance training    PT Goals (Current goals can be found in the Care Plan section)  Acute Rehab PT Goals Patient Stated Goal: return home PT Goal Formulation: With patient Time For Goal Achievement: 01/17/23 Potential to Achieve Goals: Good    Frequency Min 3X/week     Co-evaluation               AM-PAC PT "6 Clicks" Mobility  Outcome Measure Help needed turning from your back to your side while in a flat bed without using bedrails?: A  Little Help needed moving from lying on your back to sitting on the side of a flat bed without using bedrails?: A Little Help needed moving to and from a bed to a chair (including a wheelchair)?: A Little Help needed standing up from a chair using your arms (e.g., wheelchair or bedside chair)?: A Little Help needed to walk in hospital room?: A Little Help needed climbing 3-5 steps with a railing? : A Little 6 Click Score: 18    End of Session Equipment Utilized During Treatment: Gait belt Activity Tolerance: Patient tolerated treatment well Patient left: in bed;with call bell/phone within reach;with bed alarm set (no recliner in room, none available on floor) Nurse Communication: Mobility status PT Visit Diagnosis: Difficulty in walking, not elsewhere classified (R26.2)    Time: 8295-6213 PT Time Calculation (min) (ACUTE ONLY): 31 min   Charges:   PT Evaluation $PT Eval Moderate Complexity: 1 Mod PT Treatments $Gait Training: 8-22 mins       Blondell Reveal Kistler PT 01/03/2023  Acute Rehabilitation Services  Office [phone]

## 2023-01-03 NOTE — Progress Notes (Signed)
PT Cancellation Note  Patient Details Name: VADHIR MCNAY MRN: [id number] DOB: 1925-06-09   Cancelled Treatment:    Reason Eval/Treat Not Completed: Fatigue/lethargy limiting ability to participate (pt stated he's fatigued because he just ambulated to the bathroom with a NT. He requested PT check back later today. Will follow.)   Philomena Doheny PT 01/03/2023  Acute Rehabilitation Services  Office [phone]

## 2023-01-04 DIAGNOSIS — Z978 Presence of other specified devices: Secondary | ICD-10-CM | POA: Diagnosis not present

## 2023-01-04 DIAGNOSIS — R310 Gross hematuria: Secondary | ICD-10-CM | POA: Diagnosis not present

## 2023-01-04 DIAGNOSIS — N179 Acute kidney failure, unspecified: Secondary | ICD-10-CM | POA: Diagnosis not present

## 2023-01-04 LAB — CBC WITH DIFFERENTIAL/PLATELET
Abs Immature Granulocytes: 0.06 10*3/uL (ref 0.00–0.07)
Basophils Absolute: 0 10*3/uL (ref 0.0–0.1)
Basophils Relative: 0 %
Eosinophils Absolute: 0.1 10*3/uL (ref 0.0–0.5)
Eosinophils Relative: 1 %
HCT: 30.9 % — ABNORMAL LOW (ref 39.0–52.0)
Hemoglobin: 10.1 g/dL — ABNORMAL LOW (ref 13.0–17.0)
Immature Granulocytes: 1 %
Lymphocytes Relative: 15 %
Lymphs Abs: 1.4 10*3/uL (ref 0.7–4.0)
MCH: 31.9 pg (ref 26.0–34.0)
MCHC: 32.7 g/dL (ref 30.0–36.0)
MCV: 97.5 fL (ref 80.0–100.0)
Monocytes Absolute: 1.5 10*3/uL — ABNORMAL HIGH (ref 0.1–1.0)
Monocytes Relative: 17 %
Neutro Abs: 6.1 10*3/uL (ref 1.7–7.7)
Neutrophils Relative %: 66 %
Platelets: 59 10*3/uL — ABNORMAL LOW (ref 150–400)
RBC: 3.17 MIL/uL — ABNORMAL LOW (ref 4.22–5.81)
RDW: 15.9 % — ABNORMAL HIGH (ref 11.5–15.5)
WBC: 9.2 10*3/uL (ref 4.0–10.5)
nRBC: 0 % (ref 0.0–0.2)

## 2023-01-04 LAB — BASIC METABOLIC PANEL
Anion gap: 7 (ref 5–15)
BUN: 31 mg/dL — ABNORMAL HIGH (ref 8–23)
CO2: 22 mmol/L (ref 22–32)
Calcium: 8.6 mg/dL — ABNORMAL LOW (ref 8.9–10.3)
Chloride: 109 mmol/L (ref 98–111)
Creatinine, Ser: 1.49 mg/dL — ABNORMAL HIGH (ref 0.61–1.24)
GFR, Estimated: 43 mL/min — ABNORMAL LOW (ref >60)
Glucose, Bld: 107 mg/dL — ABNORMAL HIGH (ref 70–99)
Potassium: 3.6 mmol/L (ref 3.5–5.1)
Sodium: 138 mmol/L (ref 135–145)

## 2023-01-04 LAB — URINE CULTURE: Culture: 20000 — AB

## 2023-01-04 MED ORDER — LACTATED RINGERS IV SOLN: INTRAVENOUS | Status: AC

## 2023-01-04 MED ORDER — POLYETHYLENE GLYCOL 3350 17 G PO PACK
17.0000 g | PACK | Freq: Every day | ORAL | Status: DC
  Administered 2023-01-04 – 2023-01-06 (×3): 17 g via ORAL
  Filled 2023-01-04 (×3): qty 1

## 2023-01-04 MED ORDER — SENNOSIDES-DOCUSATE SODIUM 8.6-50 MG PO TABS
1.0000 | ORAL_TABLET | Freq: Two times a day (BID) | ORAL | Status: DC
  Administered 2023-01-04 – 2023-01-06 (×5): 1 via ORAL
  Filled 2023-01-04 (×5): qty 1

## 2023-01-04 NOTE — Progress Notes (Signed)
PROGRESS NOTE    Douglas Walker  TKW:[id number] DOB: 1925/10/16 DOA: 01/01/2023 PCP: Corliss Blacker, MD     Brief Narrative:  Douglas Walker is a 89 y.o. male with medical history significant of chronic diastolic heart failure/pulmonary hypertension, history of tachybradycardia syndrome status post pacemaker, h/o atrial fibrillation  off anticoagulation due to recurrent hematuria and fall risk per chart review, h/o CVA, essential tremor primarily involving the lips/oral musculature, neuropathy, ,chronic kidney disease stage III, GERD and hiatal hernia, and BPH requiring chronic Foley catheter with a history of subsequent recurrent UTIs.  Occasionally some of these organisms have been multidrug-resistant and difficult to treat. Last admission was in October 2023 outpatient follow-up with ID on 11/05/22 which time a decision was made to continue chronic Macrobid. He presents to the ED due to hematuria, Tmax of 101 F and had suboptimal blood pressures with a mildly elevated lactic acid   Son reports he had foley exchanged on 1/2 at urology's office where the catheter was irrigated vigorously. He subsequently awakened in the middle of the night because he was unable to pass urine through the catheter. This was associated with some abdominal pain and nausea.      Subjective:  WBC normalized, creatinine  remain elevated but improved, no fever, urine Foley catheter  Son states he is weaker than normal  Assessment & Plan:  Principal Problem:   Hematuria Active Problems:   AKI (acute kidney injury) ([place])   FTT (failure to thrive) in adult    Assessment and Plan:  Gross hematuria/chronic Foley catheter/Abnormal urinalysis concerning for recurrent UTI/AKI Patient without any hematuria in urinary collection bag Continue to hold Eliquis, on imipenem , plan for total of 3 days, then change to oral abx, continue ivf for another 24hrs monitor cr If hematuria recurs will need to consult  urology    Chronic diastolic heart failure with associated pulmonary hypertension Cxr " Vascular congestion is noted without definitive interstitial edema " Patient is stable on room air, no sob, no edema Close monitor volume status    Atrial fibrillation/history of TBS status post pacemaker Currently rate controlled noting was not on rate control medications prior to admission And recurrent falls and recent gross hematuria urology has stopped the Eliquis and we will not order Eliquis or other anticoagulation at this time   Chronic thrombocytopenia Etiology unclear Utilizing SCDs currently given recent hematuria Previous range for platelets had been between 80 and 100,000 and recently have dropped to as low as 50,000 this past October but this was in the setting of sepsis physiology   Reported frequent falls/history of neuropathy Patient ambulates with a walker at home Seen by PT evaluation who recommended home health Continue Lyrica  CT AP shows 2 new rib fx, but did not reports new pain, no TTP   Pulmonary nodules No follow-up indicated if low risk  Poor vision, need to be fed, fall precaution        I have Reviewed nursing notes, Vitals, pain scores, I/o's, Lab results and  imaging results since pt's last encounter, details please see discussion above  I ordered the following labs:  Unresulted Labs (From admission, onward)     Start     Ordered   01/04/23 9242  Basic metabolic panel  Daily,   R     Question:  Specimen collection method  Answer:  Lab=Lab collect   01/03/23 1650             DVT prophylaxis:  SCDs Start: 01/02/23 1159   Code Status:   Code Status: DNR  Family Communication: son at bedside on 1/4 and 1/6 Disposition:   Dispo: The patient is from: home              Anticipated d/c is to: home with home health, f85fand HFlora Vistaorder placed               Anticipated d/c date is: possible Monday,  pending cr improvement  Antimicrobials:     Anti-infectives (From admission, onward)    Start     Dose/Rate Route Frequency Ordered Stop   01/02/23 2200  meropenem (MERREM) 1 g in sodium chloride 0.9 % 100 mL IVPB        1 g 200 mL/hr over 30 Minutes Intravenous Every 12 hours 01/02/23 1637 01/05/23 0959   01/02/23 0130  meropenem (MERREM) 1 g in sodium chloride 0.9 % 100 mL IVPB        1 g 200 mL/hr over 30 Minutes Intravenous  Once 01/02/23 0124 01/02/23 0804          Objective: Vitals:   01/03/23 0847 01/03/23 2050 01/04/23 0504 01/04/23 1233  BP: (!) 153/71 130/62 129/72 131/64  Pulse: 70 69 70 69  Resp: '18 16 16 18  '$ Temp: 98.3 F (36.8 C) 98.6 F (37 C) 98.9 F (37.2 C) 98.9 F (37.2 C)  TempSrc: Oral Oral Oral Oral  SpO2: 92% 92% 90% 95%  Weight:      Height:        Intake/Output Summary (Last 24 hours) at 01/04/2023 1358 Last data filed at 01/04/2023 1231 Gross per 24 hour  Intake 368 ml  Output 1165 ml  Net -797 ml   Filed Weights   01/01/23 2036  Weight: 83 kg    Examination:  General exam: alert, awake, communicative, tremors, urine is clear in foley  Respiratory system: Clear to auscultation. Respiratory effort normal. Cardiovascular system:  RRR.  Gastrointestinal system: Abdomen is nondistended, soft and nontender.  Normal bowel sounds heard. Central nervous system: Alert and oriented. No focal neurological deficits. Extremities:  no edema Skin: No rashes, lesions or ulcers Psychiatry: Judgement and insight appear normal. Mood & affect appropriate.     Data Reviewed: I have personally reviewed  labs and visualized  imaging studies since the last encounter and formulate the plan        Scheduled Meds:  ascorbic acid  1,000 mg Oral Daily   Chlorhexidine Gluconate Cloth  6 each Topical Daily   cyanocobalamin  1,000 mcg Oral Once per day on Sun Tue Thu Sat   DULoxetine  60 mg Oral Daily   levothyroxine  25 mcg Oral QAC breakfast   loratadine  10 mg Oral Daily   multivitamin with  minerals  1 tablet Oral Daily   polyethylene glycol  17 g Oral Daily   pregabalin  50 mg Oral QHS   senna-docusate  1 tablet Oral BID   sodium chloride flush  3 mL Intravenous Q12H   topiramate  50 mg Oral Daily   Continuous Infusions:  lactated ringers 75 mL/hr at 01/04/23 1132   meropenem (MERREM) IV 1 g (01/04/23 1134)     LOS: 2 days   Time spent: 335ms  FaFlorencia ReasonsMD PhD FACP [facility]  Available via Epic secure chat 7am-7pm for nonurgent issues Please page for urgent issues To page the attending provider between 7A-7P or the covering provider during after hours 7P-7A, please  log into the web site [email or web address] and access using universal [place] password for that web site. If you do not have the password, please call the hospital operator.    01/04/2023, 1:58 PM

## 2023-01-04 NOTE — Progress Notes (Signed)
CSW spoke with family, upon DC they would like to go with [place]. Patient was recently with [facility] and would like to go back.

## 2023-01-05 DIAGNOSIS — N179 Acute kidney failure, unspecified: Secondary | ICD-10-CM | POA: Diagnosis not present

## 2023-01-05 DIAGNOSIS — Z978 Presence of other specified devices: Secondary | ICD-10-CM | POA: Diagnosis not present

## 2023-01-05 DIAGNOSIS — R310 Gross hematuria: Secondary | ICD-10-CM | POA: Diagnosis not present

## 2023-01-05 LAB — BASIC METABOLIC PANEL
Anion gap: 7 (ref 5–15)
BUN: 28 mg/dL — ABNORMAL HIGH (ref 8–23)
CO2: 23 mmol/L (ref 22–32)
Calcium: 8.5 mg/dL — ABNORMAL LOW (ref 8.9–10.3)
Chloride: 107 mmol/L (ref 98–111)
Creatinine, Ser: 1.45 mg/dL — ABNORMAL HIGH (ref 0.61–1.24)
GFR, Estimated: 44 mL/min — ABNORMAL LOW (ref >60)
Glucose, Bld: 96 mg/dL (ref 70–99)
Potassium: 3.9 mmol/L (ref 3.5–5.1)
Sodium: 137 mmol/L (ref 135–145)

## 2023-01-05 MED ORDER — AMOXICILLIN-POT CLAVULANATE 500-125 MG PO TABS
1.0000 | ORAL_TABLET | Freq: Two times a day (BID) | ORAL | Status: DC
  Administered 2023-01-05 – 2023-01-06 (×2): 1 via ORAL
  Filled 2023-01-05 (×2): qty 1

## 2023-01-05 MED ORDER — NITROFURANTOIN MONOHYD MACRO 100 MG PO CAPS: 100.0000 mg | ORAL_CAPSULE | Freq: Every day | ORAL | Status: DC

## 2023-01-05 NOTE — Progress Notes (Signed)
PROGRESS NOTE    [name]  XVQ:[id number] DOB: 1925-02-11 DOA: 01/01/2023 PCP: Corliss Blacker, MD     Brief Narrative:  Douglas Walker is a 89 y.o. male with medical history significant of chronic diastolic heart failure/pulmonary hypertension, history of tachybradycardia syndrome status post pacemaker, h/o atrial fibrillation  off anticoagulation due to recurrent hematuria and fall risk per chart review, h/o CVA, essential tremor primarily involving the lips/oral musculature, neuropathy, ,chronic kidney disease stage III, GERD and hiatal hernia, and BPH requiring chronic Foley catheter with a history of subsequent recurrent UTIs.  Occasionally some of these organisms have been multidrug-resistant and difficult to treat. Last admission was in October 2023 outpatient follow-up with ID on 11/05/22 which time a decision was made to continue chronic Macrobid. He presents to the ED due to hematuria, Tmax of 101 F and had suboptimal blood pressures with a mildly elevated lactic acid   Son reports he had foley exchanged on 1/2 at urology's office where the catheter was irrigated vigorously. He subsequently awakened in the middle of the night because he was unable to pass urine through the catheter. This was associated with some abdominal pain and nausea.      Subjective:  No acute interval changes He reports knee pain ,tylenol helped   no fever, urine is clear in  Foley catheter  Son states he is weaker than normal  Assessment & Plan:  Principal Problem:   Hematuria Active Problems:   AKI (acute kidney injury) ([place])   FTT (failure to thrive) in adult    Assessment and Plan:  Gross hematuria/chronic Foley catheter/Abnormal urinalysis concerning for recurrent UTI/AKI -Patient without any hematuria in urinary collection bag -Continue to hold Eliquis,  - Urine culture 20,000 ESBL (sensitive to imipenem, macrobid, bactrim and amp/sulbactam)  , finished imipenem x 3 days,  appear on macrobid at home , discontinue due to ckd, h/o sulfa allergy, will do augmentin  -If hematuria recurs will need to consult urology, otherwise follow up with urology outpatient    Chronic diastolic heart failure with associated pulmonary hypertension Cxr " Vascular congestion is noted without definitive interstitial edema " Patient is stable on room air, no sob, no edema Close monitor volume status    Atrial fibrillation/history of TBS status post pacemaker Currently rate controlled noting was not on rate control medications prior to admission And recurrent falls and recent gross hematuria urology has stopped the Eliquis and we will not order Eliquis or other anticoagulation at this time   Chronic thrombocytopenia Etiology unclear Utilizing SCDs currently given recent hematuria Previous range for platelets had been between 80 and 100,000 and recently have dropped to as low as 50,000 this past October but this was in the setting of sepsis physiology   Reported frequent falls/history of neuropathy Patient ambulates with a walker at home Seen by PT evaluation who recommended home health Continue Lyrica  CT AP shows 2 new rib fx, but did not reports new pain, no TTP   Pulmonary nodules No follow-up indicated if low risk  Poor vision, need to be fed, fall precaution        I have Reviewed nursing notes, Vitals, pain scores, I/o's, Lab results and  imaging results since pt's last encounter, details please see discussion above  I ordered the following labs:  Unresulted Labs (From admission, onward)     Start     Ordered   01/06/23 0500  CBC with Differential/Platelet  Tomorrow morning,   R  Question:  Specimen collection method  Answer:  Lab=Lab collect   01/05/23 0811   01/06/23 7591  Basic metabolic panel  Tomorrow morning,   R       Question:  Specimen collection method  Answer:  Lab=Lab collect   01/05/23 0811             DVT prophylaxis: SCDs Start:  01/02/23 1159   Code Status:   Code Status: DNR  Family Communication: son at bedside on 1/4 and 1/6, son over the phone on 1/7 Disposition:   Dispo: The patient is from: home              Anticipated d/c is to: home with home health, f54fand HKerrorder placed               Anticipated d/c date is: home on 1/8  Antimicrobials:    Anti-infectives (From admission, onward)    Start     Dose/Rate Route Frequency Ordered Stop   01/02/23 2200  meropenem (MERREM) 1 g in sodium chloride 0.9 % 100 mL IVPB        1 g 200 mL/hr over 30 Minutes Intravenous Every 12 hours 01/02/23 1637 01/04/23 2148   01/02/23 0130  meropenem (MERREM) 1 g in sodium chloride 0.9 % 100 mL IVPB        1 g 200 mL/hr over 30 Minutes Intravenous  Once 01/02/23 0124 01/02/23 0804          Objective: Vitals:   01/04/23 1233 01/04/23 2051 01/05/23 0507 01/05/23 1313  BP: 131/64 136/64 137/74 (!) 140/63  Pulse: 69 70 70 69  Resp: '18 16 16 16  '$ Temp: 98.9 F (37.2 C) 100 F (37.8 C) 98.7 F (37.1 C) 98.5 F (36.9 C)  TempSrc: Oral Oral Oral Oral  SpO2: 95% 91% 92% 98%  Weight:      Height:        Intake/Output Summary (Last 24 hours) at 01/05/2023 1631 Last data filed at 01/05/2023 1500 Gross per 24 hour  Intake 2348.98 ml  Output 2681 ml  Net -332.02 ml   Filed Weights   01/01/23 2036  Weight: 83 kg    Examination:  General exam: alert, awake, communicative, tremors, urine is clear in foley  Respiratory system: Clear to auscultation. Respiratory effort normal. Cardiovascular system:  RRR.  Gastrointestinal system: Abdomen is nondistended, soft and nontender.  Normal bowel sounds heard. Central nervous system: Alert and oriented. No focal neurological deficits. Extremities:  no edema Skin: No rashes, lesions or ulcers Psychiatry: Judgement and insight appear normal. Mood & affect appropriate.     Data Reviewed: I have personally reviewed  labs and visualized  imaging studies since the last  encounter and formulate the plan        Scheduled Meds:  ascorbic acid  1,000 mg Oral Daily   Chlorhexidine Gluconate Cloth  6 each Topical Daily   cyanocobalamin  1,000 mcg Oral Once per day on Sun Tue Thu Sat   DULoxetine  60 mg Oral Daily   levothyroxine  25 mcg Oral QAC breakfast   loratadine  10 mg Oral Daily   multivitamin with minerals  1 tablet Oral Daily   polyethylene glycol  17 g Oral Daily   pregabalin  50 mg Oral QHS   senna-docusate  1 tablet Oral BID   sodium chloride flush  3 mL Intravenous Q12H   topiramate  50 mg Oral Daily   Continuous Infusions:  LOS: 3 days   Time spent: 32mns  FFlorencia Reasons MD PhD FACP [facility]  Available via Epic secure chat 7am-7pm for nonurgent issues Please page for urgent issues To page the attending provider between 7A-7P or the covering provider during after hours 7P-7A, please log into the web site [email or web address] and access using universal [place] password for that web site. If you do not have the password, please call the hospital operator.    01/05/2023, 4:31 PM

## 2023-01-05 NOTE — Progress Notes (Signed)
Physical Therapy Treatment Patient Details Name: Douglas Walker MRN: [id number] DOB: May 12, 1925 Today's Date: 01/05/2023   History of Present Illness 89 y.o. male with medical history significant of chronic diastolic heart failure/pulmonary hypertension, atrial fibrillation previously on Eliquis, chronic kidney disease stage III, essential tremor primarily involving the lips/oral musculature, GERD and hiatal hernia, dyslipidemia, hypertension, neuropathy, history of prior CVA, history of tachybradycardia syndrome status post pacemaker, and BPH requiring chronic Foley catheter with a history of subsequent recurrent UTIs. Patient presented to the ER with significant gross hematuria seen in the Foley catheter drainage bag, concern for recurrent UTI, and has had several falls.    PT Comments    Pt sleeping on arrival to room. Pt easily awakened and agreeable to mobilize.  Pt ambulated in hallway short distance.  Pt left OOB in recliner with chair alarm set prior to leaving room.   Recommendations for follow up therapy are one component of a multi-disciplinary discharge planning process, led by the attending physician.  Recommendations may be updated based on patient status, additional functional criteria and insurance authorization.  Follow Up Recommendations  Home health PT     Assistance Recommended at Discharge Intermittent Supervision/Assistance  Patient can return home with the following A little help with walking and/or transfers;A little help with bathing/dressing/bathroom;Assistance with cooking/housework;Assist for transportation;Direct supervision/assist for medications management;Assistance with feeding   Equipment Recommendations  None recommended by PT    Recommendations for Other Services       Precautions / Restrictions Precautions Precautions: Fall;Other (comment) Precaution Comments: h/o recent falls per H&P; poor vision, blind R eye, tremors (needs assist to eat)      Mobility  Bed Mobility Overal bed mobility: Modified Independent                  Transfers Overall transfer level: Needs assistance Equipment used: Rolling walker (2 wheels) Transfers: Sit to/from Stand Sit to Stand: Min guard                Ambulation/Gait Ambulation/Gait assistance: Min guard Gait Distance (Feet): 40 Feet Assistive device: Rolling walker (2 wheels) Gait Pattern/deviations: Step-through pattern, Decreased stride length Gait velocity: decr     General Gait Details: min/guard for safety, no loss of balance   Stairs             Wheelchair Mobility    Modified Rankin (Stroke Patients Only)       Balance Overall balance assessment: History of Falls                                          Cognition Arousal/Alertness: Awake/alert Behavior During Therapy: WFL for tasks assessed/performed Overall Cognitive Status: Within Functional Limits for tasks assessed                                          Exercises      General Comments        Pertinent Vitals/Pain Pain Assessment Pain Assessment: No/denies pain    Home Living                          Prior Function            PT Goals (current goals can now be found in  the care plan section) Progress towards PT goals: Progressing toward goals    Frequency    Min 3X/week      PT Plan Current plan remains appropriate    Co-evaluation              AM-PAC PT "6 Clicks" Mobility   Outcome Measure  Help needed turning from your back to your side while in a flat bed without using bedrails?: A Little Help needed moving from lying on your back to sitting on the side of a flat bed without using bedrails?: A Little Help needed moving to and from a bed to a chair (including a wheelchair)?: A Little Help needed standing up from a chair using your arms (e.g., wheelchair or bedside chair)?: A Little Help needed to walk in  hospital room?: A Little Help needed climbing 3-5 steps with a railing? : A Little 6 Click Score: 18    End of Session Equipment Utilized During Treatment: Gait belt Activity Tolerance: Patient tolerated treatment well Patient left: in chair;with call bell/phone within reach;with chair alarm set Nurse Communication: Mobility status PT Visit Diagnosis: Difficulty in walking, not elsewhere classified (R26.2)     Time: 8366-2947 PT Time Calculation (min) (ACUTE ONLY): 18 min  Charges:  $Gait Training: 8-22 mins                     Arlyce Dice, DPT Physical Therapist Acute Rehabilitation Services Preferred contact method: Secure Chat Weekend Pager Only: [phone] Office: [phone]    Myrtis Hopping [place] 01/05/2023, 3:43 PM

## 2023-01-06 ENCOUNTER — Ambulatory Visit: Payer: PRIVATE HEALTH INSURANCE | Admitting: Physician Assistant

## 2023-01-06 ENCOUNTER — Telehealth: Payer: Self-pay

## 2023-01-06 DIAGNOSIS — N179 Acute kidney failure, unspecified: Secondary | ICD-10-CM | POA: Diagnosis not present

## 2023-01-06 DIAGNOSIS — R310 Gross hematuria: Secondary | ICD-10-CM | POA: Diagnosis not present

## 2023-01-06 DIAGNOSIS — N1831 Chronic kidney disease, stage 3a: Secondary | ICD-10-CM

## 2023-01-06 DIAGNOSIS — W19XXXA Unspecified fall, initial encounter: Secondary | ICD-10-CM | POA: Diagnosis not present

## 2023-01-06 LAB — CBC WITH DIFFERENTIAL/PLATELET
Abs Immature Granulocytes: 0.03 10*3/uL (ref 0.00–0.07)
Basophils Absolute: 0 10*3/uL (ref 0.0–0.1)
Basophils Relative: 1 %
Eosinophils Absolute: 0.1 10*3/uL (ref 0.0–0.5)
Eosinophils Relative: 3 %
HCT: 31 % — ABNORMAL LOW (ref 39.0–52.0)
Hemoglobin: 10.2 g/dL — ABNORMAL LOW (ref 13.0–17.0)
Immature Granulocytes: 1 %
Lymphocytes Relative: 26 %
Lymphs Abs: 1.2 10*3/uL (ref 0.7–4.0)
MCH: 31.8 pg (ref 26.0–34.0)
MCHC: 32.9 g/dL (ref 30.0–36.0)
MCV: 96.6 fL (ref 80.0–100.0)
Monocytes Absolute: 0.8 10*3/uL (ref 0.1–1.0)
Monocytes Relative: 16 %
Neutro Abs: 2.5 10*3/uL (ref 1.7–7.7)
Neutrophils Relative %: 53 %
Platelets: 57 10*3/uL — ABNORMAL LOW (ref 150–400)
RBC: 3.21 MIL/uL — ABNORMAL LOW (ref 4.22–5.81)
RDW: 15.9 % — ABNORMAL HIGH (ref 11.5–15.5)
WBC: 4.7 10*3/uL (ref 4.0–10.5)
nRBC: 0 % (ref 0.0–0.2)

## 2023-01-06 LAB — BASIC METABOLIC PANEL
Anion gap: 8 (ref 5–15)
BUN: 27 mg/dL — ABNORMAL HIGH (ref 8–23)
CO2: 21 mmol/L — ABNORMAL LOW (ref 22–32)
Calcium: 8.5 mg/dL — ABNORMAL LOW (ref 8.9–10.3)
Chloride: 108 mmol/L (ref 98–111)
Creatinine, Ser: 1.23 mg/dL (ref 0.61–1.24)
GFR, Estimated: 54 mL/min — ABNORMAL LOW (ref >60)
Glucose, Bld: 93 mg/dL (ref 70–99)
Potassium: 4 mmol/L (ref 3.5–5.1)
Sodium: 137 mmol/L (ref 135–145)

## 2023-01-06 MED ORDER — NITROFURANTOIN MONOHYD MACRO 100 MG PO CAPS: 100.0000 mg | ORAL_CAPSULE | Freq: Every day | ORAL | 0 refills | Status: DC

## 2023-01-06 MED ORDER — AMOXICILLIN-POT CLAVULANATE 500-125 MG PO TABS: 1.0000 | ORAL_TABLET | Freq: Two times a day (BID) | ORAL | 0 refills | Status: AC

## 2023-01-06 NOTE — TOC Transition Note (Signed)
Transition of Care [facility]) - CM/SW Discharge Note   Patient Details  Name: Douglas Walker MRN: [id number] Date of Birth: 1925/03/07  [facility] of Care [facility] CM/SW Contact:  Lennart Pall, LCSW Phone Number: 01/06/2023, 10:21 AM   Clinical Narrative:     Have confirmed with [facility] that they have accepted pt for HHPT follow up.  No further TOC needs.  Final next level of care: [place] Barriers to Discharge: Barriers Resolved   Patient Goals and CMS Choice      Discharge Placement                         Discharge Plan and Services Additional resources added to the After Visit Summary for                  DME Arranged: N/A DME Agency: NA       HH Arranged: PT HH Agency: [place] Date [place]: 01/06/23 Time Foley: [id number] Representative spoke with at [place]: [place] Determinants of Health ([place]) Interventions [place]: No Food Insecurity (01/02/2023)  Housing: Low Risk  (01/02/2023)  Transportation Needs: No Transportation Needs (01/02/2023)  Utilities: Not At Risk (01/02/2023)  Depression (PHQ2-9): Low Risk  (11/05/2022)  Tobacco Use: Medium Risk (01/01/2023)     Readmission Risk Interventions    10/15/2021   10:43 AM 07/26/2020   10:05 AM  Readmission Risk Prevention Plan  Transportation Screening Complete Complete  PCP or Specialist Appt within 5-7 Days  Not Complete  Not Complete comments  pending disposition  Home Care Screening  Complete  Medication Review (RN CM)  Referral to Pharmacy  Medication Review (RN Care Manager) Complete   PCP or Specialist appointment within 3-5 days of discharge Complete   HRI or [place] Complete   SW Recovery Care/Counseling Consult Complete   [place] Complete

## 2023-01-06 NOTE — Telephone Encounter (Signed)
Thanks Shaquenia  I see in the hospital note they stopped his macrobid   Please tell him to get back on that  Thank you Merridy Pascoe

## 2023-01-06 NOTE — Progress Notes (Signed)
Assessment unchanged. Pt and family verbalized of dc instructions including medications and follow up care. Discharged via wc to front entrance accompanied by NT and son.

## 2023-01-06 NOTE — Evaluation (Signed)
Occupational Therapy Evaluation Patient Details Name: Douglas Walker MRN: [id number] DOB: 1925-06-23 Today's Date: 01/06/2023   History of Present Illness Patient is a 89 year old male who presented to the ER with significant gross hematuria seen in the Foley catheter drainage bag, concern for recurrent UTI, and has had several falls.TDV:VOHYWVP diastolic heart failure/pulmonary hypertension, atrial fibrillation previously on Eliquis, chronic kidney disease stage III, essential tremor primarily involving the lips/oral musculature, GERD and hiatal hernia, dyslipidemia, hypertension, neuropathy, history of prior CVA, history of tachybradycardia syndrome status post pacemaker, and BPH requiring chronic Foley catheter with a history of subsequent recurrent UTIs.   Clinical Impression   Patient is a 89 year old male who was admitted for above.  Patient was living at home with family support. Patient was able to complete standing at sink for grooming tasks for over 7 mins on this date with no LOB. Patient was able to participate in functional mobility in hallway with supervision with RW without rest break. Patient completed toileting without physical A and distant supervision. Patient hand no signs of instability during any tasks. At end of session patient did report " I felt like I was going to give out". Patients son was called and updated on session. Patient would continue to benefit from skilled OT services at this time while admitted and after d/c to address noted deficits in order to improve overall safety and independence in ADLs.       Recommendations for follow up therapy are one component of a multi-disciplinary discharge planning process, led by the attending physician.  Recommendations may be updated based on patient status, additional functional criteria and insurance authorization.   Follow Up Recommendations  Home health OT     Assistance Recommended at Discharge Frequent or constant  Supervision/Assistance  Patient can return home with the following Assistance with cooking/housework;Direct supervision/assist for medications management;Assist for transportation;Help with stairs or ramp for entrance;Direct supervision/assist for financial management    Functional Status Assessment  Patient has had a recent decline in their functional status and demonstrates the ability to make significant improvements in function in a reasonable and predictable amount of time.  Equipment Recommendations  None recommended by OT       Precautions / Restrictions Precautions Precautions: Fall;Other (comment) Precaution Comments: h/o recent falls per H&P; poor vision, blind R eye, tremors at baseline Restrictions Weight Bearing Restrictions: No      Mobility Bed Mobility Overal bed mobility: Modified Independent                  Transfers Overall transfer level: Needs assistance Equipment used: Rolling walker (2 wheels) Transfers: Sit to/from Stand Sit to Stand: Supervision                  Balance Overall balance assessment: History of Falls   Sitting balance-Leahy Scale: Good     Standing balance support: During functional activity, Single extremity supported Standing balance-Leahy Scale: Fair Standing balance comment: standing at sink to complete grooming tasks.       ADL either performed or assessed with clinical judgement   ADL Overall ADL's : Needs assistance/impaired Eating/Feeding: Supervision/ safety Eating/Feeding Details (indicate cue type and reason): tremors at baseline with patietn still able to pick up cup from tray and take sips with no spillage or coughing noted. Grooming: Dance movement psychotherapist;Wash/dry hands;Oral care;Supervision/safety;Standing Grooming Details (indicate cue type and reason): at sink for over 7 mins to manage dentures. Upper Body Bathing: Min guard;Sitting   Lower  Body Bathing: Min guard;Sitting/lateral leans Lower Body Bathing  Details (indicate cue type and reason): patient declined to participate in tasks reporting he did this already this am. patient declined to simulate task. Upper Body Dressing : Min guard;Sitting     Lower Body Dressing Details (indicate cue type and reason): patient declined to simulate task reporting his socks were already on Toilet Transfer: Supervision/safety;Rolling walker (2 wheels);Ambulation Toilet Transfer Details (indicate cue type and reason): patient was supervision to transfer from edge of bed to commode in bathroom with RW with no LOB and distant supervision. Toileting- Clothing Manipulation and Hygiene: Supervision/safety;Sit to/from stand Toileting - Clothing Manipulation Details (indicate cue type and reason): patient reported being unable to complete hygiene tasks at this time with IV in place on posterior R hand. patient demonstrated he could reach area but did not want to while IV was in place.     Functional mobility during ADLs: Supervision/safety;Rolling walker (2 wheels) General ADL Comments: patient was able to walk to window, then to nurse stand and back to room with steady pace with RW with no     Vision Baseline Vision/History: 1 Wears glasses Additional Comments: blind in R eye with poor vision. able to participate in oral care at sink with touch and feel            Pertinent Vitals/Pain Pain Assessment Pain Assessment: No/denies pain     Hand Dominance Right   Extremity/Trunk Assessment Upper Extremity Assessment Upper Extremity Assessment: Overall WFL for tasks assessed   Lower Extremity Assessment Lower Extremity Assessment: Defer to PT evaluation   Cervical / Trunk Assessment Cervical / Trunk Assessment: Kyphotic   Communication Communication Communication: HOH   Cognition Arousal/Alertness: Awake/alert Behavior During Therapy: WFL for tasks assessed/performed Overall Cognitive Status: Within Functional Limits for tasks assessed              General Comments  patient was noted to have brusing and multiple bandages on posterior hand, and forearm on L side and red discharge            Home Living Family/patient expects to be discharged to:: Private residence Living Arrangements: Children Available Help at Discharge: Available 24 hours/day Type of Home: House Home Access: Level entry     Home Layout: One level     Bathroom Shower/Tub: Occupational psychologist: Handicapped height     Home Equipment: Conservation officer, nature (2 wheels);Shower seat;Grab bars - tub/shower   Additional Comments: lives with son, DIL, grandson. "someone is always there with me"      Prior Functioning/Environment Prior Level of Function : Independent/Modified Independent;History of Falls (last six months)             Mobility Comments: Used RW at [employer] I ADLs Comments: mod I        OT Problem List: Decreased activity tolerance;Impaired balance (sitting and/or standing);Decreased coordination;Decreased safety awareness      OT Treatment/Interventions: Self-care/ADL training;Energy conservation;Therapeutic exercise;DME and/or AE instruction;Therapeutic activities;Patient/family education;Balance training    OT Goals(Current goals can be found in the care plan section) Acute Rehab OT Goals Patient Stated Goal: none stated OT Goal Formulation: With patient/family Time For Goal Achievement: 01/20/23 Potential to Achieve Goals: Fair  OT Frequency: Min 2X/week       AM-PAC OT "6 Clicks" Daily Activity     Outcome Measure Help from another person eating meals?: A Little (set up) Help from another person taking care of personal grooming?: A Little Help from another  person toileting, which includes using toliet, bedpan, or urinal?: A Little Help from another person bathing (including washing, rinsing, drying)?: A Little Help from another person to put on and taking off regular upper body clothing?: A Little Help from another  person to put on and taking off regular lower body clothing?: A Little 6 Click Score: 18   End of Session Equipment Utilized During Treatment: Gait belt;Rolling walker (2 wheels) Nurse Communication: Mobility status  Activity Tolerance: Patient tolerated treatment well Patient left: in bed;with call bell/phone within reach;with bed alarm set  OT Visit Diagnosis: Unsteadiness on feet (R26.81);Other abnormalities of gait and mobility (R26.89)                Time: 7062-3762 OT Time Calculation (min): 37 min Charges:  OT General Charges $OT Visit: 1 Visit OT Evaluation $OT Eval Moderate Complexity: 1 Mod OT Treatments $Self Care/Home Management : 8-22 mins  Rennie Plowman, MS Acute Rehabilitation Department Office# [phone]   Willa Rough 01/06/2023, 9:40 AM

## 2023-01-06 NOTE — Plan of Care (Signed)
  Problem: Education: Goal: Knowledge of General Education information will improve Description: Including pain rating scale, medication(s)/side effects and non-pharmacologic comfort measures Outcome: Adequate for Discharge   Problem: Health Behavior/Discharge Planning: Goal: Ability to manage health-related needs will improve Outcome: Adequate for Discharge   Problem: Clinical Measurements: Goal: Ability to maintain clinical measurements within normal limits will improve Outcome: Adequate for Discharge Goal: Will remain free from infection Outcome: Adequate for Discharge Goal: Diagnostic test results will improve Outcome: Adequate for Discharge Goal: Respiratory complications will improve Outcome: Adequate for Discharge Goal: Cardiovascular complication will be avoided Outcome: Adequate for Discharge   Problem: Activity: Goal: Risk for activity intolerance will decrease Outcome: Adequate for Discharge   Problem: Nutrition: Goal: Adequate nutrition will be maintained Outcome: Adequate for Discharge   Problem: Coping: Goal: Level of anxiety will decrease Outcome: Adequate for Discharge   Problem: Elimination: Goal: Will not experience complications related to bowel motility Outcome: Adequate for Discharge Goal: Will not experience complications related to urinary retention Outcome: Adequate for Discharge   Problem: Pain Managment: Goal: General experience of comfort will improve Outcome: Adequate for Discharge   Problem: Safety: Goal: Ability to remain free from injury will improve Outcome: Adequate for Discharge   Problem: Skin Integrity: Goal: Risk for impaired skin integrity will decrease Outcome: Adequate for Discharge   Problem: Acute Rehab PT Goals(only PT should resolve) Goal: Pt Will Go Sit To Supine/Side Outcome: Adequate for Discharge Goal: Patient Will Transfer Sit To/From Stand Outcome: Adequate for Discharge Goal: Pt Will Ambulate Outcome: Adequate  for Discharge Goal: Pt/caregiver will Perform Home Exercise Program Outcome: Adequate for Discharge   Problem: Acute Rehab OT Goals (only OT should resolve) Goal: Pt. Will Transfer To Toilet Outcome: Adequate for Discharge Goal: OT Additional ADL Goal #1 Outcome: Adequate for Discharge

## 2023-01-06 NOTE — Discharge Summary (Signed)
Discharge Summary  Douglas Walker:[id number] DOB: Jun 05, 1925  PCP: Corliss Blacker, MD  Admit date: 01/01/2023 Discharge date: 01/06/2023  30 Day Unplanned Readmission Risk Score    Flowsheet Row ED to Hosp-Admission (Current) from 01/01/2023 in [facility]  30 Day Unplanned Readmission Risk Score (%) 29.78 Filed at 01/06/2023 0801       This score is the patient's risk of an unplanned readmission within 30 days of being discharged (0 -100%). The score is based on dignosis, age, lab data, medications, orders, and past utilization.   Low:  0-14.9   Medium: 15-21.9   High: 22-29.9   Extreme: 30 and above         Time spent: 30mns, more than 50% time spent on coordination of care.   Recommendations for Outpatient Follow-up:  F/u with PCP within a week  for hospital discharge follow up, repeat cbc/bmp at follow up F/u with ID Dr VGale JourneyF/[facility] with [facility] health order placed     Discharge Diagnoses:  Active Hospital Problems   Diagnosis Date Noted   Hematuria 01/02/2023   [place]) [place]    Priority: 2.   FTT (failure to thrive) in adult 01/02/2023    Resolved Hospital Problems  No resolved problems to display.    Discharge Condition: stable  Diet recommendation: heart healthy  Filed Weights   01/01/23 2036  Weight: 83 kg    History of present illness:  HPI: LREG BIRCHERis a 89y.o. male with medical history significant of chronic diastolic heart failure/pulmonary hypertension, atrial fibrillation previously on Eliquis, chronic kidney disease stage III, essential tremor primarily involving the lips/oral musculature, GERD and hiatal hernia, dyslipidemia, hypertension, neuropathy, history of prior CVA, history of tachybradycardia syndrome status post pacemaker, and BPH requiring chronic Foley catheter with a history of subsequent recurrent UTIs.  Occasionally some of these organisms have been  multidrug-resistant and difficult to treat.  Patient has been followed by infectious disease service in the past noting he has been on Macrobid for 3 months in 2022.  Last admission was in October 2023 outpatient follow-up with ID on 11/05/22 which time a decision was made to continue chronic Macrobid.   Patient presented to the ER with significant gross hematuria seen in the Foley catheter drainage bag.  He initially was evaluated at the urology office where the catheter was irrigated vigorously.  He subsequently awakened in the middle of the night because he was unable to pass urine through the catheter.  This was associated with some abdominal pain and nausea.  Because of the hematuria and recent falls the urologist recommended discontinuing Eliquis which was done on 11/3.  Patient's urinalysis was concerning for another recurrent UTI and given his history of multidrug-resistant organisms including ESBL he was started on meropenem by the EDP.  Addition patient was treated as evolving sepsis and had a Tmax of 101 F and had suboptimal blood pressures with a mildly elevated lactic acid but improved after IV fluid hydration.   Upon my evaluation of the patient it is noted that the urine has now clear to yellow in color.  He reports that he is not very hungry that he is no longer having any abdominal discomfort.  Plan is to admit the patient for further evaluation and treatment.    Hospital Course:  Principal Problem:   Hematuria Active Problems:   [place]   FTT (failure to thrive)  in adult   Assessment and Plan:   Gross hematuria/chronic Foley catheter/Abnormal urinalysis concerning for recurrent UTI/AKI on CKDIIIa -appear on macrobid suppressive therapy at home -Patient without any hematuria in urinary collection bag since admission  -Continue to hold Eliquis,  - Urine culture 20,000 ESBL (sensitive to imipenem, macrobid, bactrim and amp/sulbactam)  , finished imipenem x  3 days,   h/o sulfa allergy, change to augmentin to finish total of 7 days treatment -cr has improved, can resume home macrobid suppressive therapy once finish augmentin, f/u with ID and urology     Chronic diastolic heart failure with associated pulmonary hypertension Cxr " Vascular congestion is noted without definitive interstitial edema " Patient is stable on room air, no sob, no edema  volume status stable    Atrial fibrillation/history of TBS status post pacemaker Currently rate controlled noting was not on rate control medications prior to admission And recurrent falls and recent gross hematuria urology has stopped the Eliquis and we will not order Eliquis or other anticoagulation at this time   Chronic thrombocytopenia Etiology unclear Utilizing SCDs currently given recent hematuria Previous range for platelets had been between 80 and 100,000 and recently have dropped to as low as 50,000 this past October but this was in the setting of sepsis physiology Appear on chronic prednisone    Reported frequent falls/history of neuropathy/Poor vision, need to be fed, fall precaution  Patient ambulates with a walker at home Seen by PT evaluation who recommended home health Continue Lyrica   CT AP shows 2 new rib fx, but did not reports new pain, no TTP   Pulmonary nodules No follow-up indicated if low risk        Discharge Exam: BP 132/76 (BP Location: Left Arm)   Pulse 69   Temp 97.8 F (36.6 C) (Oral)   Resp 16   Ht '5\' 9"'$  (1.753 m)   Wt 83 kg   SpO2 94%   BMI 27.02 kg/m   General: NAD, AAOx3, hard of hearing, poor vision, chronic tremor , urine is clear in foley bag Cardiovascular: RRR, no edema  Respiratory: normal respiratory effort     Discharge Instructions     Diet - low sodium heart healthy   Complete by: As directed    Increase activity slowly   Complete by: As directed       Allergies as of 01/06/2023       Reactions   Sulfa Antibiotics Other (See  Comments)   Weakness Dizziness  Sweats   Mysoline [primidone] Other (See Comments)   Sedation    Zocor [simvastatin] Other (See Comments)   Arthralgias Fatigue        Medication List     STOP taking these medications    levofloxacin 500 MG tablet Commonly known as: LEVAQUIN       TAKE these medications    acetaminophen 325 MG tablet Commonly known as: TYLENOL Take 2 tablets (650 mg total) by mouth every 4 (four) hours as needed for mild pain (or temp > 37.5 C (99.5 F)).   amoxicillin-clavulanate 500-125 MG tablet Commonly known as: AUGMENTIN Take 1 tablet by mouth 2 (two) times daily for 3 days.   apixaban 2.5 MG Tabs tablet Commonly known as: ELIQUIS Take 1 tablet (2.5 mg total) by mouth 2 (two) times daily.   cyanocobalamin 1000 MCG tablet Commonly known as: VITAMIN B12 Take 1 tablet (1,000 mcg total) by mouth every other day. What changed:  when to take this additional  instructions   DULoxetine 60 MG capsule Commonly known as: Cymbalta Take 1 capsule (60 mg total) by mouth daily.   furosemide 20 MG tablet Commonly known as: LASIX Take 1 tablet (20 mg total) by mouth 3 (three) times a week. Take on Mondays, Wednesdays, and Fridays. What changed:  when to take this additional instructions   levothyroxine 25 MCG tablet Commonly known as: SYNTHROID Take 1 tablet (25 mcg total) by mouth daily before breakfast.   loratadine 10 MG tablet Commonly known as: CLARITIN Take 10 mg by mouth daily.   midodrine 2.5 MG tablet Commonly known as: PROAMATINE Take 1 tablet (2.5 mg total) by mouth 3 (three) times daily with meals. What changed:  when to take this reasons to take this   multivitamin with minerals Tabs tablet Take 1 tablet by mouth daily.   nitrofurantoin (macrocrystal-monohydrate) 100 MG capsule Commonly known as: MACROBID Take 1 capsule (100 mg total) by mouth at bedtime. Start taking on: January 10, 2023 What changed: These instructions  start on January 10, 2023. If you are unsure what to do until then, ask your doctor or other care provider.   Ocuvite Adult 50+ Caps Take 1 capsule by mouth daily.   polyethylene glycol 17 g packet Commonly known as: MIRALAX / GLYCOLAX Take 17 g by mouth daily. What changed:  when to take this reasons to take this   predniSONE 5 MG tablet Commonly known as: DELTASONE Take 5 mg by mouth daily.   pregabalin 50 MG capsule Commonly known as: LYRICA Take 50 mg by mouth at bedtime.   topiramate 50 MG tablet Commonly known as: TOPAMAX Take 1 tablet (50 mg total) by mouth 2 (two) times daily. What changed: when to take this   vitamin C 1000 MG tablet Take 1,000 mg by mouth daily.       Allergies  Allergen Reactions   Sulfa Antibiotics Other (See Comments)    Weakness Dizziness  Sweats    Mysoline [Primidone] Other (See Comments)    Sedation    Zocor [Simvastatin] Other (See Comments)    Arthralgias Fatigue    Follow-up Information     Corliss Blacker, MD Follow up in 1 week(s).   Specialty: Internal Medicine Why: hospital discharge follow up, repeat basic lab works including cbc/bmp at follow up Contact information: [place] [address] [phone]         Jabier Mutton, MD Follow up.   Specialty: Infectious Diseases Contact information: [address] [place] [address] [phone]         [place] Follow up.   Contact information: [place] [phone]                 The results of significant diagnostics from this hospitalization (including imaging, microbiology, ancillary and laboratory) are listed below for reference.    Significant Diagnostic Studies: CT Head Wo Contrast  Result Date: 01/02/2023 CLINICAL DATA:  Rib fractures by earlier CT.  No known injury EXAM: CT HEAD WITHOUT CONTRAST CT CERVICAL SPINE WITHOUT CONTRAST  TECHNIQUE: Multidetector CT imaging of the head and cervical spine was performed following the standard protocol without intravenous contrast. Multiplanar CT image reconstructions of the cervical spine were also generated. RADIATION DOSE REDUCTION: This exam was performed according to the departmental dose-optimization program which includes automated exposure control, adjustment of the mA and/or kV according to patient size and/or  use of iterative reconstruction technique. COMPARISON:  06/18/2015 head CT FINDINGS: CT HEAD FINDINGS Brain: No evidence of acute infarction, hemorrhage, hydrocephalus, extra-axial collection or mass lesion/mass effect. Chronic left frontal cortex infarct. Chronic small vessel ischemia in the cerebral white matter with patchy low-density in the deep gray nuclei, especially bilateral thalamus, likely related to the same. No chart history to indicate acute infarct. Generalized brain atrophy. Vascular: No hyperdense vessel or unexpected calcification. Skull: Negative for fracture Sinuses/Orbits: No visible injury. Right enucleation and prosthesis. CT CERVICAL SPINE FINDINGS Alignment: Normal. Skull base and vertebrae: No acute fracture.  Subjective osteopenia Soft tissues and spinal canal: No prevertebral fluid or swelling. No visible canal hematoma. Disc levels:  Ordinary degenerative changes for age. Upper chest: Negative IMPRESSION: No evidence of acute intracranial or cervical spine injury. Chronic small vessel ischemia and remote left frontal cortex infarct. Electronically Signed   By: Jorje Guild M.D.   On: 01/02/2023 05:44   CT Cervical Spine Wo Contrast  Result Date: 01/02/2023 CLINICAL DATA:  Rib fractures by earlier CT.  No known injury EXAM: CT HEAD WITHOUT CONTRAST CT CERVICAL SPINE WITHOUT CONTRAST TECHNIQUE: Multidetector CT imaging of the head and cervical spine was performed following the standard protocol without intravenous contrast. Multiplanar CT image  reconstructions of the cervical spine were also generated. RADIATION DOSE REDUCTION: This exam was performed according to the departmental dose-optimization program which includes automated exposure control, adjustment of the mA and/or kV according to patient size and/or use of iterative reconstruction technique. COMPARISON:  06/18/2015 head CT FINDINGS: CT HEAD FINDINGS Brain: No evidence of acute infarction, hemorrhage, hydrocephalus, extra-axial collection or mass lesion/mass effect. Chronic left frontal cortex infarct. Chronic small vessel ischemia in the cerebral white matter with patchy low-density in the deep gray nuclei, especially bilateral thalamus, likely related to the same. No chart history to indicate acute infarct. Generalized brain atrophy. Vascular: No hyperdense vessel or unexpected calcification. Skull: Negative for fracture Sinuses/Orbits: No visible injury. Right enucleation and prosthesis. CT CERVICAL SPINE FINDINGS Alignment: Normal. Skull base and vertebrae: No acute fracture.  Subjective osteopenia Soft tissues and spinal canal: No prevertebral fluid or swelling. No visible canal hematoma. Disc levels:  Ordinary degenerative changes for age. Upper chest: Negative IMPRESSION: No evidence of acute intracranial or cervical spine injury. Chronic small vessel ischemia and remote left frontal cortex infarct. Electronically Signed   By: Jorje Guild M.D.   On: 01/02/2023 05:44   CT ABDOMEN PELVIS WO CONTRAST  Result Date: 01/02/2023 CLINICAL DATA:  Sepsis.  Gross hematuria. EXAM: CT ABDOMEN AND PELVIS WITHOUT CONTRAST TECHNIQUE: Multidetector CT imaging of the abdomen and pelvis was performed following the standard protocol without IV contrast. RADIATION DOSE REDUCTION: This exam was performed according to the departmental dose-optimization program which includes automated exposure control, adjustment of the mA and/or kV according to patient size and/or use of iterative reconstruction  technique. COMPARISON:  10/14/2022. FINDINGS: Lower chest: Heart is enlarged and coronary artery calcifications are noted. Pacemaker leads are noted in the heart. Mild atelectasis is present at the lung bases. There is a 5 mm nodule in the left lower lobe, axial image 31, unchanged. A 4 mm nodule is present in the left lower lobe, axial image 35, unchanged. Hepatobiliary: Stable subcentimeter hypodense lesion in the left lobe of the liver. The gallbladder is without stones. No biliary ductal dilatation. Pancreas: Unremarkable. No pancreatic ductal dilatation or surrounding inflammatory changes. Spleen: Normal in size without focal abnormality. Adrenals/Urinary Tract: The adrenal glands are within  normal limits. Bilateral renal cysts are noted. No renal calculus or hydronephrosis. A Foley catheter is present in the urinary bladder. There is a diverticulum anterior to the urinary bladder there is bladder wall thickening with perivesicular fat stranding. Stomach/Bowel: Stomach is within normal limits. Appendix is not seen. No evidence of bowel wall thickening, distention, or inflammatory changes. No free air or pneumatosis. Scattered diverticula are present along the colon without evidence of diverticulitis. Vascular/Lymphatic: Aortic atherosclerosis. No enlarged abdominal or pelvic lymph nodes. Reproductive: The prostate gland is normal in size. Other: No abdominopelvic ascites. There is a fat containing inguinal hernia on the left. Musculoskeletal: There are fractures of the T8 and T9 ribs on the right which is new from the previous exam. Degenerative changes are present in the thoracolumbar spine. IMPRESSION: 1. Foley catheter in the urinary bladder. There is a bladder diverticulum anteriorly. Perivesicular fat stranding is present suggesting infectious or inflammatory cystitis. 2. No renal calculus or hydronephrosis hydronephrosis bilaterally. 3. Stable left lower lobe pulmonary nodules measuring 5 mm. No follow-up  needed if patient is low-risk.This recommendation follows the consensus statement: Guidelines for Management of Incidental Pulmonary Nodules Detected on CT Images: From the Fleischner Society 2017; Radiology 2017; 284:228-243. 4. Rib fractures at T8 and T9 on the right which are new from the previous exam. Clinical correlation is recommended. 5. Aortic atherosclerosis. Electronically Signed   By: Brett Fairy M.D.   On: 01/02/2023 04:11   DG Chest Port 1 View  Result Date: 01/02/2023 CLINICAL DATA:  Possible sepsis EXAM: PORTABLE CHEST 1 VIEW COMPARISON:  10/13/2022 FINDINGS: Cardiac shadow is enlarged. Pacing device is again seen. Tortuous thoracic aorta is noted with atherosclerotic calcifications. Vascular congestion is noted without definitive interstitial edema. No bony abnormality is noted. IMPRESSION: Mild CHF without significant edema. Electronically Signed   By: Inez Catalina M.D.   On: 01/02/2023 01:33    Microbiology: Recent Results (from the past 240 hour(s))  Resp panel by RT-PCR (RSV, Flu A&B, Covid) Anterior Nasal Swab     Status: None   Collection Time: 01/01/23  8:42 PM   Specimen: Anterior Nasal Swab  Result Value Ref Range Status   SARS Coronavirus 2 by RT PCR NEGATIVE NEGATIVE Final    Comment: (NOTE) SARS-CoV-2 target nucleic acids are NOT DETECTED.  The SARS-CoV-2 RNA is generally detectable in upper respiratory specimens during the acute phase of infection. The lowest concentration of SARS-CoV-2 viral copies this assay can detect is 138 copies/mL. A negative result does not preclude SARS-Cov-2 infection and should not be used as the sole basis for treatment or other patient management decisions. A negative result may occur with  improper specimen collection/handling, submission of specimen other than nasopharyngeal swab, presence of viral mutation(s) within the areas targeted by this assay, and inadequate number of viral copies(<138 copies/mL). A negative result must be  combined with clinical observations, patient history, and epidemiological information. The expected result is Negative.  Fact Sheet for Patients:  EntrepreneurPulse.com.au  Fact Sheet for Healthcare Providers:  IncredibleEmployment.be  This test is no t yet approved or cleared by the [place] FDA and  has been authorized for detection and/or diagnosis of SARS-CoV-2 by FDA under an Emergency Use Authorization (EUA). This EUA will remain  in effect (meaning this test can be used) for the duration of the COVID-19 declaration under Section 564(b)(1) of the Act, 21 U.S.C.section 360bbb-3(b)(1), unless the authorization is terminated  or revoked sooner.       Influenza A by  PCR NEGATIVE NEGATIVE Final   Influenza B by PCR NEGATIVE NEGATIVE Final    Comment: (NOTE) The Xpert Xpress SARS-CoV-2/FLU/RSV plus assay is intended as an aid in the diagnosis of influenza from Nasopharyngeal swab specimens and should not be used as a sole basis for treatment. Nasal washings and aspirates are unacceptable for Xpert Xpress SARS-CoV-2/FLU/RSV testing.  Fact Sheet for Patients: EntrepreneurPulse.com.au  Fact Sheet for Healthcare Providers: IncredibleEmployment.be  This test is not yet approved or cleared by the [place] FDA and has been authorized for detection and/or diagnosis of SARS-CoV-2 by FDA under an Emergency Use Authorization (EUA). This EUA will remain in effect (meaning this test can be used) for the duration of the COVID-19 declaration under Section 564(b)(1) of the Act, 21 U.S.C. section 360bbb-3(b)(1), unless the authorization is terminated or revoked.     Resp Syncytial Virus by PCR NEGATIVE NEGATIVE Final    Comment: (NOTE) Fact Sheet for Patients: EntrepreneurPulse.com.au  Fact Sheet for Healthcare Providers: IncredibleEmployment.be  This test is not yet  approved or cleared by the [place] FDA and has been authorized for detection and/or diagnosis of SARS-CoV-2 by FDA under an Emergency Use Authorization (EUA). This EUA will remain in effect (meaning this test can be used) for the duration of the COVID-19 declaration under Section 564(b)(1) of the Act, 21 U.S.C. section 360bbb-3(b)(1), unless the authorization is terminated or revoked.  Performed at [facility], [place] [address] [place], [place] [address]   Blood Culture (routine x 2)     Status: None (Preliminary result)   Collection Time: 01/02/23  1:34 AM   Specimen: BLOOD  Result Value Ref Range Status   Specimen Description   Final    BLOOD LEFT ANTECUBITAL Performed at [place] [address] [place], [place] [address]    Special Requests   Final    BOTTLES DRAWN AEROBIC AND ANAEROBIC Blood Culture adequate volume Performed at [place] [address], [place], [place] [address]    Culture   Final    NO GROWTH 4 DAYS Performed at [facility], [place] [address] [place], [place] [address]    Report Status PENDING  Incomplete  Blood Culture (routine x 2)     Status: None (Preliminary result)   Collection Time: 01/02/23  1:34 AM   Specimen: BLOOD  Result Value Ref Range Status   Specimen Description   Final    BLOOD RIGHT ANTECUBITAL Performed at [place] [address] [place], [place] [address]    Special Requests   Final    BOTTLES DRAWN AEROBIC AND ANAEROBIC Blood Culture results may not be optimal due to an inadequate volume of blood received in culture bottles Performed at [place] [address] [place], [place] [address]    Culture   Final    NO GROWTH 4 DAYS Performed at [facility], [place] [address], [place], [place] [address]    Report Status PENDING  Incomplete  Urine Culture     Status: Abnormal   Collection Time: 01/02/23   4:27 AM   Specimen: In/Out Cath Urine  Result Value Ref Range Status   Specimen Description   Final    IN/OUT CATH URINE Performed at [place] [address], [place],  [address]    Special Requests   Final    NONE Performed at [facility], [place] [address] [place], [place]  [address]    Culture (A)  Final    20,000 COLONIES/mL ESCHERICHIA COLI Confirmed Extended Spectrum Beta-Lactamase Producer (ESBL).  In bloodstream infections from ESBL organisms, carbapenems are preferred over piperacillin/tazobactam. They are shown to have a lower risk of mortality.    Report Status 01/04/2023 FINAL  Final   Organism ID, Bacteria ESCHERICHIA COLI (A)  Final      Susceptibility   Escherichia coli - MIC*    AMPICILLIN >=32 RESISTANT Resistant     CEFAZOLIN >=64 RESISTANT Resistant     CEFEPIME 4 INTERMEDIATE Intermediate     CEFTRIAXONE >=64 RESISTANT Resistant     CIPROFLOXACIN >=4 RESISTANT Resistant     GENTAMICIN <=1 SENSITIVE Sensitive     IMIPENEM <=0.25 SENSITIVE Sensitive     NITROFURANTOIN <=16 SENSITIVE Sensitive     TRIMETH/SULFA <=20 SENSITIVE Sensitive     AMPICILLIN/SULBACTAM 4 SENSITIVE Sensitive     PIP/TAZO <=4 SENSITIVE Sensitive     * 20,000 COLONIES/mL ESCHERICHIA COLI     Labs: Basic Metabolic Panel: Recent Labs  Lab 01/02/23 0134 01/03/23 0443 01/04/23 0525 01/05/23 0514 01/06/23 0511  NA 135 140 138 137 137  K 4.1 3.9 3.6 3.9 4.0  CL 101 109 109 107 108  CO2 '24 23 22 23 '$ 21*  GLUCOSE 110* 93 107* 96 93  BUN 35* 29* 31* 28* 27*  CREATININE 1.95* 1.56* 1.49* 1.45* 1.23  CALCIUM 8.9 8.5* 8.6* 8.5* 8.5*  MG  --  2.3  --   --   --   PHOS  --  3.2  --   --   --    Liver Function Tests: Recent Labs  Lab 01/02/23 0134 01/03/23 0443  AST 22 20  ALT 27 26  ALKPHOS 73 72  BILITOT 1.0 0.6  PROT 7.2 6.2*  ALBUMIN 3.9 3.5   No results for input(s): "LIPASE", "AMYLASE" in the last 168 hours. No results for  input(s): "AMMONIA" in the last 168 hours. CBC: Recent Labs  Lab 01/01/23 2048 01/03/23 0443 01/04/23 0525 01/06/23 0511  WBC 19.3* 9.5 9.2 4.7  NEUTROABS  --   --  6.1 2.5  HGB 10.5* 10.3* 10.1* 10.2*  HCT 32.2* 31.5* 30.9* 31.0*  MCV 98.8 97.8 97.5 96.6  PLT 81* 66* 59* 57*   Cardiac Enzymes: No results for input(s): "CKTOTAL", "CKMB", "CKMBINDEX", "TROPONINI" in the last 168 hours. BNP: BNP (last 3 results) Recent Labs    08/29/22 1524  BNP 357.1*    ProBNP (last 3 results) No results for input(s): "PROBNP" in the last 8760 hours.  CBG: No results for input(s): "GLUCAP" in the last 168 hours.  FURTHER DISCHARGE INSTRUCTIONS:   Get Medicines reviewed and adjusted: Please take all your medications with you for your next visit with your Primary MD   Laboratory/radiological data: Please request your Primary MD to go over all hospital tests and procedure/radiological results at the follow up, please ask your Primary MD to get all Hospital records sent to his/her office.   In some cases, they will be blood work, cultures and biopsy results pending at the time of your discharge. Please request that your primary care M.D. goes through all the records of your hospital data and follows up on these results.   Also Note the following: If you experience worsening of your admission symptoms, develop shortness of breath, life threatening emergency, suicidal or homicidal thoughts you must seek medical attention immediately by calling 911 or calling your MD immediately  if symptoms  less severe.   You must read complete instructions/literature along with all the possible adverse reactions/side effects for all the Medicines you take and that have been prescribed to you. Take any new Medicines after you have completely understood and accpet all the possible adverse reactions/side effects.    Do not drive when taking Pain medications or sleeping medications (Benzodaizepines)   Do not  take more than prescribed Pain, Sleep and Anxiety Medications. It is not advisable to combine anxiety,sleep and pain medications without talking with your primary care practitioner   Special Instructions: If you have smoked or chewed Tobacco  in the last 2 yrs please stop smoking, stop any regular Alcohol  and or any Recreational drug use.   Wear Seat belts while driving.   Please note: You were cared for by a hospitalist during your hospital stay. Once you are discharged, your primary care physician will handle any further medical issues. Please note that NO REFILLS for any discharge medications will be authorized once you are discharged, as it is imperative that you return to your primary care physician (or establish a relationship with a primary care physician if you do not have one) for your post hospital discharge needs so that they can reassess your need for medications and monitor your lab values.     Signed:  Florencia Reasons MD, PhD, FACP  [facility] 01/06/2023, 9:51 AM

## 2023-01-06 NOTE — Telephone Encounter (Signed)
Patient's son called wanting to confirm if patient should follow up sooner with ID. Advised to keep current appointment unless otherwise advised from the provider. Routing to provider.  Eugenia Mcalpine, LPN

## 2023-01-07 LAB — CULTURE, BLOOD (ROUTINE X 2)
Culture: NO GROWTH
Culture: NO GROWTH
Special Requests: ADEQUATE

## 2023-01-08 NOTE — Telephone Encounter (Signed)
Patient made aware to go ahead and restart Macrobid.  Patient and daughter in law verbalized understanding.

## 2023-01-13 NOTE — Telephone Encounter (Signed)
Noted - please get more details on procedure and send to our preop pool to help guide the duration of holding Eliquis with our pharm team and who to fax info to. Would also recommend to r/s visit when they are able. Thank you!  For preop team to review when request comes through: from a medical clearance standpoint he has had a tumultuous course so is at higher risk for complications at baseline but I do not necessarily think at his age any additional cardiac testing would mitigate that risk. This seems like something that needs to get done from a urologic infection standpoint so would proceed, just needs the information on how long they need to hold Eliquis based on the expected procedure.

## 2023-01-17 ENCOUNTER — Ambulatory Visit
Admission: RE | Admit: 2023-01-17 | Discharge: 2023-01-17 | Disposition: A | Discharge status: Home / Self Care | Payer: Medicare Other | Source: Ambulatory Visit | Attending: Internal Medicine | Admitting: Internal Medicine

## 2023-01-17 ENCOUNTER — Other Ambulatory Visit: Payer: Self-pay | Admitting: Internal Medicine

## 2023-01-17 DIAGNOSIS — M25552 Pain in left hip: Secondary | ICD-10-CM

## 2023-01-17 DIAGNOSIS — M898X5 Other specified disorders of bone, thigh: Secondary | ICD-10-CM

## 2023-01-22 ENCOUNTER — Ambulatory Visit: Payer: PRIVATE HEALTH INSURANCE | Attending: Internal Medicine

## 2023-01-22 DIAGNOSIS — I442 Atrioventricular block, complete: Secondary | ICD-10-CM

## 2023-01-22 LAB — CUP PACEART REMOTE DEVICE CHECK
Battery Remaining Longevity: 19 mo
Battery Voltage: 2.89 V
Brady Statistic AP VP Percent: 99.41 %
Brady Statistic AP VS Percent: 0 %
Brady Statistic AS VP Percent: 0 %
Brady Statistic AS VS Percent: 0.59 %
Brady Statistic RA Percent Paced: 99.73 %
Brady Statistic RV Percent Paced: 99.41 %
Date Time Interrogation Session: 20240124032306
Implantable Lead Connection Status: 753985
Implantable Lead Connection Status: 753985
Implantable Lead Implant Date: 20191206
Implantable Lead Implant Date: 20191206
Implantable Lead Location: 753860
Implantable Lead Location: 753860
Implantable Lead Model: 3830
Implantable Lead Model: 5076
Implantable Pulse Generator Implant Date: 20191206
Lead Channel Impedance Value: 285 Ohm
Lead Channel Impedance Value: 342 Ohm
Lead Channel Impedance Value: 399 Ohm
Lead Channel Impedance Value: 399 Ohm
Lead Channel Sensing Intrinsic Amplitude: 5.125 mV
Lead Channel Sensing Intrinsic Amplitude: 5.125 mV
Lead Channel Sensing Intrinsic Amplitude: 8.875 mV
Lead Channel Sensing Intrinsic Amplitude: 8.875 mV
Lead Channel Setting Pacing Amplitude: 2 V
Lead Channel Setting Pacing Amplitude: 2.5 V
Lead Channel Setting Pacing Pulse Width: 0.4 ms
Lead Channel Setting Sensing Sensitivity: 1.2 mV
Zone Setting Status: 755011
Zone Setting Status: 755011

## 2023-01-28 ENCOUNTER — Ambulatory Visit (INDEPENDENT_AMBULATORY_CARE_PROVIDER_SITE_OTHER): Payer: Medicare Other | Admitting: Internal Medicine

## 2023-01-28 ENCOUNTER — Other Ambulatory Visit: Payer: Self-pay

## 2023-01-28 ENCOUNTER — Encounter: Payer: Self-pay | Admitting: Internal Medicine

## 2023-01-28 VITALS — BP 134/65 | HR 108 | Temp 97.5°F | Ht 69.0 in | Wt 190.0 lb

## 2023-01-28 DIAGNOSIS — N390 Urinary tract infection, site not specified: Secondary | ICD-10-CM | POA: Diagnosis not present

## 2023-01-28 DIAGNOSIS — Z978 Presence of other specified devices: Secondary | ICD-10-CM

## 2023-01-28 NOTE — Patient Instructions (Addendum)
Continue macrobid  See me in 6 months  Follow up urology as needed

## 2023-01-28 NOTE — Progress Notes (Signed)
[facility] for Infectious Disease  Reason for Consult:MDRO uti  Referring Provider: Inda Merlin    Patient Active Problem List   Diagnosis Date Noted   Hematuria 01/02/2023   FTT (failure to thrive) in adult 01/02/2023   Severe sepsis ([place]) 10/13/2022   Nausea and vomiting 10/13/2022   Normocytic anemia 10/13/2022   AAA (abdominal aortic aneurysm) ([place]) 10/13/2022   Physical deconditioning 08/29/2022   Eye pain, right 08/10/2022   Blind painful right eye 08/08/2022   Vitreous hemorrhage of right eye ([place]) 08/05/2022   Choroidal hemorrhage, right 08/05/2022   Secondary glaucoma due to combination mechanisms, right, moderate stage 08/05/2022   History of corneal transplant 05/16/2022   Recurrent UTI 01/31/2022   Chronic indwelling Foley catheter 01/31/2022   Benign prostatic hyperplasia with lower urinary tract symptoms 01/31/2022   Medication monitoring encounter 01/31/2022   Multiple drug resistant organism (MDRO) culture positive 01/31/2022   Bacteremia due to Proteus species 10/11/2021   AKI (acute kidney injury) ([place]) 28/31/5176   Complicated UTI (urinary tract infection) 07/02/2021   Pain in both testicles    Right hemiparesis ([place]) 04/23/2021   Cerebrovascular accident (CVA) due to embolism of left middle cerebral artery ([place]) 04/23/2021   Neuropathy 04/23/2021   Abnormality of gait 04/23/2021   Abnormal results of thyroid function studies 04/04/2021   Acquired iron deficiency anemia due to decreased absorption 04/04/2021   Acquired thrombophilia ([place]) 04/04/2021   Cerebral atherosclerosis 04/04/2021   Basal cell carcinoma of nose 04/04/2021   Benign hypertensive heart and renal disease, with heart and renal failure ([place]) 04/04/2021   Benign prostatic hyperplasia 04/04/2021   Obesity 04/04/2021   Bradycardia 04/04/2021   Closed fracture of one rib 04/04/2021   Constipation by delayed colonic transit 04/04/2021   Fatigue 04/04/2021   Gastro-esophageal  reflux disease without esophagitis 04/04/2021   Hypothyroidism 04/04/2021   Left lower quadrant pain 04/04/2021   Lumbar radiculopathy 04/04/2021   Reactive depression 04/04/2021   Mass of chest wall 04/04/2021   Mixed hyperlipidemia 04/04/2021   Nocturia 04/04/2021   Other shoulder lesions, right shoulder 04/04/2021   Pain in limb 04/04/2021   Peripheral edema 04/04/2021   Personal history of malignant neoplasm of bladder 04/04/2021   Pruritus of genitalia 04/04/2021   Tremor 04/04/2021   Unilateral primary osteoarthritis, left knee 04/04/2021   Unspecified mononeuropathy of unspecified lower limb 04/04/2021   Urinary incontinence 04/04/2021   Dysphagia, post-stroke    Chronic diastolic congestive heart failure (HCC)    Chronic pain syndrome    Left middle cerebral artery stroke ([place]) 02/01/2021   Acute CVA (cerebrovascular accident) ([place]) 01/28/2021   Pacemaker 08/30/2020   Hematoma, chest wall 07/24/2020   Exudative age-related macular degeneration of right eye with inactive choroidal neovascularization ([place]) 05/10/2020   Exudative age-related macular degeneration of left eye with inactive choroidal neovascularization ([place]) 05/10/2020   Retinal hemorrhage of right eye 05/10/2020   Exposure keratopathy, bilateral 05/10/2020   Bilateral dry eyes 05/10/2020   Advanced nonexudative age-related macular degeneration of both eyes with subfoveal involvement 05/10/2020   Complete heart block ([place]) 12/04/2018   Chronic low back pain 12/15/2017   Hereditary and idiopathic peripheral neuropathy 07/01/2016   Essential tremor 02/28/2016   BPPV (benign paroxysmal positional vertigo) 06/19/2015   Dizziness 06/18/2015   Bacteria in urine 06/18/2015   Stage 3a chronic kidney disease (CKD) ([place]) 06/18/2015   Chronic diastolic CHF (congestive heart failure) ([place]) 05/30/2015   Fall  Syncope 05/19/2015   Lower urinary tract infectious disease 05/19/2015   Hypotension 05/19/2015    Dehydration 05/19/2015   Fracture of rib of left side 05/19/2015   Syncope and collapse 05/19/2015   Mobitz type II atrioventricular block 09/13/2014   Essential hypertension 09/13/2014   Chronic anticoagulation 09/13/2014   SSS (sick sinus syndrome) ([place]) 09/13/2014   Paroxysmal atrial fibrillation ([place]) 10/28/2013   Thrombocytopenia ([place]) 04/06/2013    Cc -- f/u recurrent uti/mdro   HPI: Douglas Walker is a 89 y.o. male hx pacemaker, neuropathy, LUTS in setting bph, hx chronic prostatitis, left MCA cva, chf with preserved ef, ckd3, pAfib on eliquis, hx low-grade transitional cell carcinoma of bladder 2005 (no recurrence on subsequent cystoscopy), referred here to id clinic for hx of MDRO uti. Today patient is here for routine follow up   01/28/23 id clinic visit Patient had an admission 1/3-01/06/23 at [facility] for gross hematuria seen in his foley catheter bag. Saw urology the day before that and had catheter exchanged when hematuria happened immediately after, and had irrigation; subsequently had abd pain/nausea in setting ongoing hematuria. His urologist advised him to stop eliquis. He presented to the ed on 1/03 with sepsis. Ucx only with 20k esbl (remains sensitive to macrobid). Was given amox-clav (didn't discuss with id) after he was given a dose meropenem in the ed.  I called him subsequently and advise to restart macrobid as well after he discharged home. Back to baseline No cough No dyspnea No rash Back on eliquis when he was discharged Catheter last chagned 1/23 no further bleeding since admission     07/24/22 id clinic visit Patient doing well no uti since I last saw him, might be some gap in his macrobid as he "just started" a couple weeks ago Stable legs swelling Some sob with laying flat chronically Foley changed every 3 weeks  No complaint today    ----------- Reviewed outside record: He has been seen by [facility] urology who has recently recommended  continuous bladder drainage with urinary catheter for bph/luts (at least 3 months prior to 11/01/2021 visit, to be changed every 3 weeks) Patient noted to have had 2 uti requiring hospital admission for sepsis. One in 06/2024 with esbl, and one 10/25/21 with proteus. Both given meropenem. He has imaging which showed no GU stone  He appears to have been placed on cephalexin chronic suppression  Previous cx: 10/27 [facility] urine cx >100k proteus mirabilis (R amp/sulb, ceftriaxone, cefazolin, cipro, cefotetan, nitrofurantoin, bactrim; S ertapenem, tobra, amikacin) 09/01 [facility] urine cx > 100k pseudomonas (pan sensitive piptazo, meropenem, cipro, ceftaz, aztreonam, gent) 07/25 [facility] ucx >100k e coli esbl (R cipro; s amikacin, ertapenem, nitrofurantoin, gent, bactrim) 06/09 ucx citrobacter murliniae (R ceftriaxone, cefotaxim, cipro; S nitrofurantoin, gent, amikacin, nitrofurantoin) 11/2020 ucx esbl ecoli (R cipro; S gent, amikacin, nitrofurantoin, bactrim)     Of note, patient was on macrobid when his first sepsis (06/2021) occurred. Patient was on macrobid for about 3 months   He wasn't on any other prophylaxis at that time   He wasn't on methanamene before. But he is getting cranberry/vit c   ----------------- 04/30/22 id clinic f/u He saw my partner Dr West Bali for follow up on 01/31/22, doing well without recurrent uti on suppressive macrobid He had peri-foley urinary leakage at that time. He had a HH nurse trying to change the foley three times for 45 minutes per his son [name]'s report unsuccessfully. He then went to the urologist and  had 1 placed in the clinic  He has had slight bloody drainage. He last have it changed again a week ago -- no gross blood.  He has been putting on neosporin. I had told him to stop neosporin via mychart the last 24-48 hours  He can see some bloody stain on the depend though   No fever/chill/flank pain, suprapubic pain  He has  been complaining of discomfort in the urethral area  but no f/c, malaise, altered mentation, lethargy.    11/05/22 id clinic f/u Patient admitted 10/15-24 for sepsis thought to be uti Foley cateter exchanged. Urine cx with old foley polymicrobial; replaced foley ucx negative. Ct abd/pelv suggest bladder diverticula rather than abscess per urology evaluation Completed 10 days meropenem Blood cx negative Was on macrobid prior to that for prophylaxis  Reviewed cx again; prior esbl ecoli (S bactrim; R cipro; I macrobid); proteus mirabilis (s Bactrim; R macrobid)  He was on macrobid for more than a year prior to this new infection.   Patient had visual hallucination when he was in hospital and a few days after discharge but no longer present. He is at baseline function now   He is on midodrine for hypotension. He is also on low dose lasix. His heart doctor at this time wants to keep him on lasix He is also on pred 5 mg daily and topamax, lyrica, cymbalta He also takes tramadol 50 mg qhs scheduled   Reviewed chart from 09/2022 admission  Review of Systems: ROS All other ROS negative       Past Medical History:  Diagnosis Date   Anemia    Arthritis    shoulders and back   Cancer ([place])    skin cancers   Chronic diastolic (congestive) heart failure (HCC)    Chronic low back pain 12/15/2017   CKD (chronic kidney disease) stage 3, GFR 30-59 ml/min (HCC)    Essential tremor 02/28/2016   GERD (gastroesophageal reflux disease)    Hernia    History of hiatal hernia    Hyperlipemia    Hypertension    Macular degeneration    Neuromuscular disorder (HCC)    neuropathy   Paroxysmal atrial fibrillation ([place]) 10/28/2013   Presence of permanent cardiac pacemaker 12/04/2018   Pulmonary hypertension ([place])    Stroke ([place]) 12/2020   Tachycardia-bradycardia syndrome ([place]) 09/13/2014   Thoracic aortic aneurysm (HCC)    Thrombocytopenia (HCC)    TIA (transient ischemic attack)      Social History   Tobacco Use   Smoking status: Former    Types: Cigarettes    Quit date: 12/30/1962    Years since quitting: 60.1   Smokeless tobacco: Never  Vaping Use   Vaping Use: Never used  Substance Use Topics   Alcohol use: No   Drug use: No    Family History  Problem Relation Age of Onset   GI problems Mother    Other Sister        PAIN ISSUES   Hearing loss Sister    Blindness Sister    Heart attack Neg Hx    Stroke Neg Hx     Allergies  Allergen Reactions   Sulfa Antibiotics Other (See Comments)    Weakness Dizziness  Sweats    Mysoline [Primidone] Other (See Comments)    Sedation    Zocor [Simvastatin] Other (See Comments)    Arthralgias Fatigue    OBJECTIVE: Vitals:   01/28/23 1110  Weight: 190 lb (86.2 kg)  Body mass index is 28.06 kg/m.   Physical Exam General/constitutional: no distress, pleasant HEENT: Normocephalic, PER, Conj Clear, EOMI, Oropharynx clear Neck supple CV: rrr no mrg Lungs: clear to auscultation, normal respiratory effort Abd: Soft, Nontender Ext: no edema Skin: No Rash   Neuro: nonfocal; resting tremor; hard at hearing, wears hearing aides  Foley in place   Lab: Lab Results  Component Value Date   WBC 4.7 01/06/2023   HGB 10.2 (L) 01/06/2023   HCT 31.0 (L) 01/06/2023   MCV 96.6 01/06/2023   PLT 57 (L) 29/93/7169   Last metabolic panel Lab Results  Component Value Date   GLUCOSE 93 01/06/2023   NA 137 01/06/2023   K 4.0 01/06/2023   CL 108 01/06/2023   CO2 21 (L) 01/06/2023   BUN 27 (H) 01/06/2023   CREATININE 1.23 01/06/2023   GFRNONAA 54 (L) 01/06/2023   CALCIUM 8.5 (L) 01/06/2023   PHOS 3.2 01/03/2023   PROT 6.2 (L) 01/03/2023   ALBUMIN 3.5 01/03/2023   BILITOT 0.6 01/03/2023   ALKPHOS 72 01/03/2023   AST 20 01/03/2023   ALT 26 01/03/2023   ANIONGAP 8 01/06/2023    Microbiology:  Serology:  Imaging:   Assessment/plan: Problem List Items Addressed This Visit        Genitourinary   Recurrent UTI - Primary     Other   Chronic indwelling Foley catheter   Recurrent UTI/MDR UTI  H/o Transitional cell ca of bladder on remission BPH with LUTS s/p chronic indwelling Foleys  As of 11/01/2021 per patient's son, he has had several's uti at least 84 a year  Discussed assymptomatic bacteriuria and risk of this with his bph, foley He does seem to have admissions related to uti/sepsis 06/2021 and 09/2021.  Discuss things to avoid looking at in terms of dx for uti such as odor  Advise patient and his son to call our clinic if concern for uti  Reviewed micros form the past year; majority is sensitive to macrobid. Reviewed current evidence of various gfr which doesn't affect macrobid if >30 mL/min  Discuss with patient and his son also the failure is determined long term (over 6 months to a year) in terms of seeing reduction in uti frequency. I do not think he has failed macrobid  All his previous bacteria are resistant to cephalexin   ------------ 04/30/22 Doing well without clinical evidence of uti His son had discussed spc before but at this time not ready Likely having urethral/penile meatus irritation via trauma with recent repeated foley placement  -stop neosporin -use vaseline as needed for skin care -- if urology ok with lidocaine ointment could use for symptoms control at this time -look for sign of infection such as suprapubic pain/flank pain, fever, chill, lethargy/malaise and to call our office immediately  -continue macrobid for uti prophy -monitor for sign of lung pneumonitis  11/05/22 assessment Patient had done well for 1 year without uti on macrobid prior to 09/2022 and wants to continue that. We discuss potentially changing to bactrim if he has frequent recurrent uti  Chf/hyptension/polypharmacy can discuss with cards/neurology/pcp to cut down as needed as these can confound uti evaluation  I would advise at least today dc tramadol.     01/28/23 Had another uti in setting traumatic foley placement causing hematuria/requiring flushing not via CBI. Esbl grew again but resolved with amox-clav (after a dose meropenem 1/03) Back on macrobid doing well Continue macrobid F/u 6 months  I have spent a total  of 30 minutes of face-to-face and non-face-to-face time, excluding clinical staff time, preparing to see patient, ordering tests and/or medications, and provide counseling the patient   Follow-up: Return in about 6 months (around 07/29/2023).  Jabier Mutton, [place] [facility] [place] [phone] pager   [phone] cell 01/28/2023, 11:11 AM

## 2023-01-31 ENCOUNTER — Other Ambulatory Visit: Payer: Self-pay | Admitting: Internal Medicine

## 2023-01-31 ENCOUNTER — Other Ambulatory Visit: Payer: Self-pay

## 2023-01-31 MED ORDER — NITROFURANTOIN MONOHYD MACRO 100 MG PO CAPS: 100.0000 mg | ORAL_CAPSULE | Freq: Every day | ORAL | 0 refills | Status: DC

## 2023-02-04 ENCOUNTER — Encounter: Payer: Self-pay | Admitting: Internal Medicine

## 2023-02-04 ENCOUNTER — Ambulatory Visit: Payer: Medicare Other | Attending: Internal Medicine | Admitting: Internal Medicine

## 2023-02-04 VITALS — BP 130/60 | HR 70 | Ht 69.0 in | Wt 190.0 lb

## 2023-02-04 DIAGNOSIS — I480 Paroxysmal atrial fibrillation: Secondary | ICD-10-CM

## 2023-02-04 DIAGNOSIS — D6869 Other thrombophilia: Secondary | ICD-10-CM

## 2023-02-04 DIAGNOSIS — N1831 Chronic kidney disease, stage 3a: Secondary | ICD-10-CM

## 2023-02-04 DIAGNOSIS — I2584 Coronary atherosclerosis due to calcified coronary lesion: Secondary | ICD-10-CM

## 2023-02-04 DIAGNOSIS — I5032 Chronic diastolic (congestive) heart failure: Secondary | ICD-10-CM | POA: Diagnosis not present

## 2023-02-04 DIAGNOSIS — I495 Sick sinus syndrome: Secondary | ICD-10-CM | POA: Diagnosis not present

## 2023-02-04 DIAGNOSIS — I7121 Aneurysm of the ascending aorta, without rupture: Secondary | ICD-10-CM | POA: Insufficient documentation

## 2023-02-04 DIAGNOSIS — I2510 Atherosclerotic heart disease of native coronary artery without angina pectoris: Secondary | ICD-10-CM | POA: Insufficient documentation

## 2023-02-04 DIAGNOSIS — I700 Atherosclerosis of aorta: Secondary | ICD-10-CM | POA: Insufficient documentation

## 2023-02-04 MED ORDER — FUROSEMIDE 20 MG PO TABS: 20.0000 mg | ORAL_TABLET | Freq: Every day | ORAL | 11 refills | Status: DC | PRN

## 2023-02-04 NOTE — Patient Instructions (Addendum)
Medication Instructions:  Your physician has recommended you make the following change in your medication:  START: furosemide (Lasix) 20 mg by mouth once daily as needed for swelling. (This medication is in addition to daily dose of furosemide 20 mg )    *If you need a refill on your cardiac medications before your next appointment, please call your pharmacy*   Lab Work: TODAY: BNP, BMP  If you have labs (blood work) drawn today and your tests are completely normal, you will receive your results only by: [place] (if you have MyChart) OR A paper copy in the mail If you have any lab test that is abnormal or we need to change your treatment, we will call you to review the results.   Testing/Procedures: NONE   Follow-Up: At [facility], you and your health needs are our priority.  As part of our continuing mission to provide you with exceptional heart care, we have created designated Provider Care Teams.  These Care Teams include your primary Cardiologist (physician) and Advanced Practice Providers (APPs -  Physician Assistants and Nurse Practitioners) who all work together to provide you with the care you need, when you need it.     Your next appointment:   4 month(s)  Provider:   Melina Copa, PA-C

## 2023-02-04 NOTE — Progress Notes (Signed)
Cardiology Office Note:    Date:  02/04/2023   ID:  Douglas Walker, DOB 1925-06-23, MRN [id number]  PCP:  Corliss Blacker, MD   [facility] Prov[facility] Cardiologist:  Sinclair Grooms, MD (Inactive) Electrophysiologist:  [name], MD     Referring MD: Corliss Blacker, MD   CC: Transition to new cardiologist  History of Present Illness:    Douglas Walker is a 89 y.o. male with a hx of Extensive CAC with no prior CAD, Stable anemia and thromboctyopenia that has tolerated AC, Chronic HFpEF and PHT with hypotension that has required midodrine, TAA (51 mm) without plans for following due to age and comorbidity that precludes intervention, PAF and essential tremor tolerating DOAC, and prior history of stroke.  Patient notes that he is doing well.   Since last visit notes no complains . Son recounts his recent admission of hematuria and UTI and his follow up with ID.  No chest pain or pressure. Patient can no longer feel when he is or is not in a fib. Denies SOB. Denies orthopnea. Mild leg swelling. No syncope.  Son recounts his long history.  He notes that at home the patient notes he has lived a good life and does not want heroic measures.  Past Medical History:  Diagnosis Date   Anemia    Arthritis    shoulders and back   Cancer (HCC)    skin cancers   Chronic diastolic (congestive) heart failure (HCC)    Chronic low back pain 12/15/2017   CKD (chronic kidney disease) stage 3, GFR 30-59 ml/min (HCC)    Essential tremor 02/28/2016   GERD (gastroesophageal reflux disease)    Hernia    History of hiatal hernia    Hyperlipemia    Hypertension    Macular degeneration    Neuromuscular disorder (HCC)    neuropathy   Paroxysmal atrial fibrillation ([place]) 10/28/2013   Presence of permanent cardiac pacemaker 12/04/2018   Pulmonary hypertension ([place])    Stroke ([place]) 12/2020   Tachycardia-bradycardia syndrome ([place]) 09/13/2014   Thoracic aortic  aneurysm (HCC)    Thrombocytopenia (HCC)    TIA (transient ischemic attack)     Past Surgical History:  Procedure Laterality Date   [place] / REPLACE / REMOVE PACEMAKER  12/04/2018   IRRIGATION AND DEBRIDEMENT ABSCESS Right 07/24/2020   Procedure: IRRIGATION AND DEBRIDEMENT HEMATOMA;  Surgeon: Coralie Keens, MD;  Location: [facility];  Service: General;  Laterality: Right;   MASS EXCISION Right 07/20/2020   Procedure: EXCISION OF RIGHT CHEST WALL MASS;  Surgeon: Stark Klein, MD;  Location: [facility];  Service: General;  Laterality: Right;   PACEMAKER IMPLANT N/A 12/04/2018   Procedure: PACEMAKER IMPLANT;  Surgeon: Evans Lance, MD;  Location: [facility];  Service: Cardiovascular;  Laterality: N/A;    Current Medications: Current Meds  Medication Sig   acetaminophen (TYLENOL) 325 MG tablet Take 2 tablets (650 mg total) by mouth every 4 (four) hours as needed for mild pain (or temp > 37.5 C (99.5 F)).   apixaban (ELIQUIS) 2.5 MG TABS tablet Take 1 tablet (2.5 mg total) by mouth 2 (two) times daily.   Ascorbic Acid (VITAMIN C) 1000 MG tablet Take 1,000 mg by mouth daily.   DULoxetine (CYMBALTA) 60 MG capsule Take 1 capsule (60 mg total) by mouth daily.  furosemide (LASIX) 20 MG tablet Take 20 mg by mouth daily.   furosemide (LASIX) 20 MG tablet Take 1 tablet (20 mg total) by mouth daily as needed for fluid or edema.   levothyroxine (SYNTHROID) 25 MCG tablet Take 1 tablet (25 mcg total) by mouth daily before breakfast.   loratadine (CLARITIN) 10 MG tablet Take 10 mg by mouth daily.   midodrine (PROAMATINE) 2.5 MG tablet Take 1 tablet (2.5 mg total) by mouth 3 (three) times daily with meals. (Patient taking differently: Take 2.5 mg by mouth as needed (Low BP).)   Multiple Vitamin (MULTIVITAMIN WITH MINERALS) TABS tablet Take 1 tablet by mouth daily.   Multiple Vitamins-Minerals (OCUVITE ADULT 50+) CAPS Take 1  capsule by mouth daily.   nitrofurantoin, macrocrystal-monohydrate, (MACROBID) 100 MG capsule Take 1 capsule (100 mg total) by mouth at bedtime.   polyethylene glycol (MIRALAX / GLYCOLAX) 17 g packet Take 17 g by mouth daily. (Patient taking differently: Take 17 g by mouth daily as needed for mild constipation.)   predniSONE (DELTASONE) 5 MG tablet Take 5 mg by mouth daily.   pregabalin (LYRICA) 50 MG capsule Take 50 mg by mouth at bedtime.   topiramate (TOPAMAX) 50 MG tablet Take 1 tablet (50 mg total) by mouth 2 (two) times daily. (Patient taking differently: Take 50 mg by mouth daily.)   traMADol (ULTRAM) 50 MG tablet daily at 6 (six) AM.   vitamin B-12 (CYANOCOBALAMIN) 1000 MCG tablet Take 1 tablet (1,000 mcg total) by mouth every other day. (Patient taking differently: Take 1,000 mcg by mouth See admin instructions. 1076mg oral daily on Sunday, Tuesday, Thursday, and Saturday, repeat.)     Allergies:   Sulfa antibiotics, Mysoline [primidone], and Zocor [simvastatin]   Social History   Socioeconomic History   Marital status: Widowed    Spouse name: Not on file   Number of children: 1   Years of education: 10   Highest education level: Not on file  Occupational History    Comment: retired  Tobacco Use   Smoking status: Former    Types: Cigarettes    Quit date: 12/30/1962    Years since quitting: 60.1   Smokeless tobacco: Never  Vaping Use   Vaping Use: Never used  Substance and Sexual Activity   Alcohol use: No   Drug use: No   Sexual activity: Not Currently  Other Topics Concern   Not on file  Social History Narrative   Lives alone   Right-handed   Caffeine use- caffeine free coffee, occas soda   Social Determinants of Health   Financial Resource Strain: Not on file  Food Insecurity: No Food Insecurity (01/02/2023)   Hunger Vital Sign    Worried About Running Out of Food in the Last Year: Never true    Ran Out of Food in the Last Year: Never true  Transportation  Needs: No Transportation Needs (01/02/2023)   PRAPARE - THydrologist(Medical): No    Lack of Transportation (Non-Medical): No  Physical Activity: Not on file  Stress: Not on file  Social Connections: Not on file     Family History: The patient's family history includes Blindness in his sister; GI problems in his mother; Hearing loss in his sister; Other in his sister. There is no history of Heart attack or Stroke.  ROS:   Please see the history of present illness.     All other systems reviewed and are negative.  EKGs/Labs/Other Studies Reviewed:  The following studies were reviewed today:  Cardiac Studies & Procedures       ECHOCARDIOGRAM  ECHOCARDIOGRAM COMPLETE 01/28/2021  Narrative ECHOCARDIOGRAM REPORT    Patient Name:   ROBBIE RIDEAUX Date of Exam: 01/28/2021 Medical Rec #:  [id number]          Height:       70.5 in Accession #:    [id number]         Weight:       203.5 lb Date of Birth:  14-Apr-1925          BSA:          2.114 m Patient Age:    43 years           BP:           176/81 mmHg Patient Gender: M                  HR:           70 bpm. Exam Location:  Inpatient  Procedure: 2D Echo  Indications:    TIA 435.9  History:        Patient has prior history of Echocardiogram examinations, most recent 05/19/2015. Chronic kidney disease, Arrythmias:Atrial Fibrillation; Risk Factors:Hypertension.  Sonographer:    Johny Chess Referring Phys: [id number] PING T ZHANG   Sonographer Comments: Image acquisition challenging due to uncooperative patient. IMPRESSIONS   1. Left ventricular ejection fraction, by estimation, is 50 to 55%. The left ventricle has low normal function. The left ventricle has no regional wall motion abnormalities. Left ventricular diastolic function could not be evaluated. 2. Right ventricular systolic function is mildly reduced. The right ventricular size is normal. There is moderately elevated pulmonary  artery systolic pressure. 3. Left atrial size was moderately dilated. 4. Right atrial size was moderately dilated. 5. The mitral valve is normal in structure. No evidence of mitral valve regurgitation. 6. The aortic valve is tricuspid. Aortic valve regurgitation is mild. Mild aortic valve sclerosis is present, with no evidence of aortic valve stenosis. 7. Aortic dilatation noted. There is moderate dilatation of the ascending aorta, measuring 47 mm. 8. The inferior vena cava is dilated in size with <50% respiratory variability, suggesting right atrial pressure of 15 mmHg.  Comparison(s): Prior images unable to be directly viewed, comparison made by report only. Ascending aorta dilation has worsened since 2019 (but measurement is similar to CT angio report from may 2021).  FINDINGS Left Ventricle: Left ventricular ejection fraction, by estimation, is 50 to 55%. The left ventricle has low normal function. The left ventricle has no regional wall motion abnormalities. The left ventricular internal cavity size was normal in size. There is no left ventricular hypertrophy. Abnormal (paradoxical) septal motion, consistent with RV pacemaker. Left ventricular diastolic function could not be evaluated due to atrial fibrillation. Left ventricular diastolic function could not be evaluated.  Right Ventricle: The right ventricular size is normal. No increase in right ventricular wall thickness. Right ventricular systolic function is mildly reduced. There is moderately elevated pulmonary artery systolic pressure. The tricuspid regurgitant velocity is 2.76 m/s, and with an assumed right atrial pressure of 15 mmHg, the estimated right ventricular systolic pressure is 22.0 mmHg.  Left Atrium: Left atrial size was moderately dilated.  Right Atrium: Right atrial size was moderately dilated.  Pericardium: There is no evidence of pericardial effusion.  Mitral Valve: The mitral valve is normal in structure. No  evidence of mitral valve regurgitation.  Tricuspid Valve: The tricuspid  valve is normal in structure. Tricuspid valve regurgitation is mild.  Aortic Valve: The aortic valve is tricuspid. Aortic valve regurgitation is mild. Mild aortic valve sclerosis is present, with no evidence of aortic valve stenosis.  Pulmonic Valve: The pulmonic valve was not well visualized. Pulmonic valve regurgitation is not visualized.  Aorta: Aortic dilatation noted. There is moderate dilatation of the ascending aorta, measuring 47 mm.  Venous: The inferior vena cava is dilated in size with less than 50% respiratory variability, suggesting right atrial pressure of 15 mmHg.  IAS/Shunts: No atrial level shunt detected by color flow Doppler.  Additional Comments: A pacer wire is visualized.   LEFT VENTRICLE PLAX 2D LVIDd:         4.70 cm Diastology LVIDs:         3.40 cm LV e' medial:    5.87 cm/s LV PW:         1.30 cm LV E/e' medial:  21.0 LV IVS:        1.00 cm LV e' lateral:   10.40 cm/s LV E/e' lateral: 11.8   RIGHT VENTRICLE            IVC RV S prime:     8.38 cm/s  IVC diam: 2.40 cm TAPSE (M-mode): 1.1 cm  LEFT ATRIUM             Index       RIGHT ATRIUM           Index LA diam:        4.20 cm 1.99 cm/m  RA Area:     16.00 cm LA Vol (A2C):   63.8 ml 30.18 ml/m RA Volume:   41.30 ml  19.54 ml/m LA Vol (A4C):   67.7 ml 32.03 ml/m LA Biplane Vol: 65.7 ml 31.08 ml/m AORTIC VALVE LVOT Vmax:   97.90 cm/s LVOT Vmean:  62.500 cm/s LVOT VTI:    0.228 m  AORTA Ao Asc diam: 4.70 cm  MITRAL VALVE                TRICUSPID VALVE MV Area (PHT): 3.60 cm     TR Peak grad:   30.5 mmHg MV Decel Time: 211 msec     TR Vmax:        276.00 cm/s MV E velocity: 123.00 cm/s MV A velocity: 33.10 cm/s   SHUNTS MV E/A ratio:  3.72         Systemic VTI: 0.23 m  Mihai Croitoru MD Electronically signed by Sanda Klein MD Signature Date/Time: 01/28/2021/2:10:39 PM    Final    MONITORS  CARDIAC EVENT  MONITOR 07/11/2015  Narrative  The study is difficult to interpret because of wandering baseline and decreased P-wave amplitude  Bradycardia to normal sinus rhythm noted most frequently  Accelerated junctional rhythm is felt to be present at times.  Possible wandering atrial pacemaker rhythm  No excessive pauses or bradycardia less than 45 bpm  No definite instances of atrial fibrillation were identified.  Findings are compatible with sinus node dysfunction No pauses greater than 3 seconds. No tach arrhythmias. No definite atrial fibrillation            Recent Labs: 08/29/2022: B Natriuretic Peptide 357.1 09/23/2022: TSH 4.780 01/03/2023: ALT 26; Magnesium 2.3 01/06/2023: BUN 27; Creatinine, Ser 1.23; Hemoglobin 10.2; Platelets 57; Potassium 4.0; Sodium 137  Recent Lipid Panel    Component Value Date/Time   CHOL 188 01/29/2021 0237   TRIG 96 01/29/2021 0237   HDL  43 01/29/2021 0237   CHOLHDL 4.4 01/29/2021 0237   VLDL 19 01/29/2021 0237   LDLCALC 126 (H) 01/29/2021 0237     Physical Exam:    VS:  BP 130/60   Pulse 70   Ht '5\' 9"'$  (1.753 m)   Wt 190 lb (86.2 kg)   SpO2 94%   BMI 28.06 kg/m     Wt Readings from Last 3 Encounters:  02/04/23 190 lb (86.2 kg)  01/28/23 190 lb (86.2 kg)  01/01/23 182 lb 15.7 oz (83 kg)    GEN: Elderly male in no acute distress CARDIAC: RRR  with systolic and diastolic murmurs RESPIRATORY:  Clear to auscultation without rales, wheezing or rhonchi  ABDOMEN: Soft, non-tender, non-distended MUSCULOSKELETAL:  +1 bilateral edema; No deformity  SKIN: Warm and dry  ASSESSMENT:    1. Chronic heart failure with preserved ejection fraction (HCC)   2. Paroxysmal atrial fibrillation ([place])   3. SSS (sick sinus syndrome) ([place])   4. Chronic diastolic CHF (congestive heart failure) (HCC)   5. Stage 3a chronic kidney disease (CKD) ([place])   6. Aneurysm of ascending aorta without rupture ([place])   7. Coronary artery calcification   8. Aortic  atherosclerosis (HCC)    PLAN:    HFpEF Hypotension CKD stage IIIB - Will check BNP and BMP today - lasix to 20 mg PO daily with PRN  - with hypotension that, rarely requires midodrine I have not started SGLT2i or MRA; if he comes off midodrine would consider this as next medication  Thoracic aortic aneurysm - reviewed this with patient and son; given that he is not a surgical candidate will not engage in q6 month testing and will not refer to TCTS - gave support information through [facility]  Persistent atrial fibrillation Acquired thrombophilia Essential tremor SSS s/p PPM - continue low dose eliquis - device mgmt per EP  CAC and aortic atherosclerosis - given age and statin myopathy, no plans for therapy start or repeat lab testing  For months with Dayna in f/u     Time Spent Directly with Patient:   I have spent a total of 40 minutes with the patient reviewing notes, imaging, EKGs, labs and examining the patient as well as establishing an assessment and plan that was discussed personally with the patient.  > 50% of time was spent in direct patient care  family and reviewing imaging with patient.    Medication Adjustments/Labs and Tests Ordered: Current medicines are reviewed at length with the patient today.  Concerns regarding medicines are outlined above.  Orders Placed This Encounter  Procedures   Basic metabolic panel   Pro b natriuretic peptide (BNP)   Meds ordered this encounter  Medications   furosemide (LASIX) 20 MG tablet    Sig: Take 1 tablet (20 mg total) by mouth daily as needed for fluid or edema.    Dispense:  30 tablet    Refill:  11    Please do not fill pt will call when needed    Patient Instructions  Medication Instructions:  Your physician has recommended you make the following change in your medication:  START: furosemide (Lasix) 20 mg by mouth once daily as needed for swelling. (This medication is in addition to daily dose of furosemide  20 mg )    *If you need a refill on your cardiac medications before your next appointment, please call your pharmacy*   Lab Work: TODAY: BNP, BMP  If you have labs (blood work)  drawn today and your tests are completely normal, you will receive your results only by: MyChart Message (if you have MyChart) OR A paper copy in the mail If you have any lab test that is abnormal or we need to change your treatment, we will call you to review the results.   Testing/Procedures: NONE   Follow-Up: At [facility], you and your health needs are our priority.  As part of our continuing mission to provide you with exceptional heart care, we have created designated Provider Care Teams.  These Care Teams include your primary Cardiologist (physician) and Advanced Practice Providers (APPs -  Physician Assistants and Nurse Practitioners) who all work together to provide you with the care you need, when you need it.     Your next appointment:   4 month(s)  Provider:   Melina Copa, PA-C         Signed, Werner Lean, MD  02/04/2023 5:32 PM    [facility]

## 2023-02-05 LAB — BASIC METABOLIC PANEL
BUN/Creatinine Ratio: 17 (ref 10–24)
BUN: 25 mg/dL (ref 10–36)
CO2: 28 mmol/L (ref 20–29)
Calcium: 9.7 mg/dL (ref 8.6–10.2)
Chloride: 101 mmol/L (ref 96–106)
Creatinine, Ser: 1.43 mg/dL — ABNORMAL HIGH (ref 0.76–1.27)
Glucose: 90 mg/dL (ref 70–99)
Potassium: 4.6 mmol/L (ref 3.5–5.2)
Sodium: 143 mmol/L (ref 134–144)
eGFR: 45 mL/min/{1.73_m2} — ABNORMAL LOW (ref >59)

## 2023-02-05 LAB — PRO B NATRIURETIC PEPTIDE: NT-Pro BNP: 1402 pg/mL — ABNORMAL HIGH (ref 0–486)

## 2023-02-06 ENCOUNTER — Ambulatory Visit: Payer: Medicare Other | Admitting: Podiatry

## 2023-02-07 ENCOUNTER — Encounter: Payer: Self-pay | Admitting: Podiatry

## 2023-02-07 ENCOUNTER — Ambulatory Visit (INDEPENDENT_AMBULATORY_CARE_PROVIDER_SITE_OTHER): Payer: Medicare Other | Admitting: Podiatry

## 2023-02-07 ENCOUNTER — Telehealth: Payer: Self-pay | Admitting: Internal Medicine

## 2023-02-07 DIAGNOSIS — B351 Tinea unguium: Secondary | ICD-10-CM

## 2023-02-07 DIAGNOSIS — M79674 Pain in right toe(s): Secondary | ICD-10-CM | POA: Diagnosis not present

## 2023-02-07 DIAGNOSIS — I5032 Chronic diastolic (congestive) heart failure: Secondary | ICD-10-CM

## 2023-02-07 DIAGNOSIS — M79675 Pain in left toe(s): Secondary | ICD-10-CM | POA: Diagnosis not present

## 2023-02-07 DIAGNOSIS — E1142 Type 2 diabetes mellitus with diabetic polyneuropathy: Secondary | ICD-10-CM

## 2023-02-07 MED ORDER — FUROSEMIDE 20 MG PO TABS: ORAL_TABLET | ORAL | 3 refills | Status: DC

## 2023-02-07 MED ORDER — FUROSEMIDE 20 MG PO TABS: 20.0000 mg | ORAL_TABLET | ORAL | 3 refills | Status: DC

## 2023-02-07 NOTE — Telephone Encounter (Signed)
The patient has been notified of the result and verbalized understanding.  All questions (if any) were answered. Precious Gilding, RN 02/07/2023 5:14 PM   Pt scheduled for lab work on 02/24/23.

## 2023-02-07 NOTE — Progress Notes (Signed)
This patient returns to my office for at risk foot care.  This patient requires this care by a professional since this patient will be at risk due to having peripheral neuropathy, CKD, Coagulation defect and thrombocytopenia.  This patient is unable to cut nails himself since the patient cannot reach his nails.These nails are painful walking and wearing shoes.  This patient presents for at risk foot care today.  He presents to the office with male son.  He is taking eliquis.  General Appearance  Alert, conversant and in no acute stress.  Vascular  Dorsalis pedis and posterior tibial  pulses are not  palpable  bilaterally.  Capillary return is within normal limits  bilaterally. Temperature is within normal limits  Bilaterally. Swelling feet/legs  Neurologic  Senn-Weinstein monofilament wire test within normal limits  bilaterally. Muscle power within normal limits bilaterally.  Nails Thick disfigured discolored nails with subungual debris  from hallux to fifth toes bilaterally. Pincer nails hallux  B/L. No evidence of bacterial infection or drainage bilaterally.  Orthopedic  No limitations of motion  feet .  No crepitus or effusions noted.  No bony pathology or digital deformities noted.  HAV  B/L.  Skin  normotropic skin with no porokeratosis noted bilaterally.  No signs of infections or ulcers noted.     Onychomycosis  Pain in right toes  Pain in left toes  Consent was obtained for treatment procedures.   Mechanical debridement of nails 1-5  bilaterally performed with a nail nipper.  Filed with dremel without incident.    Return office visit  10 weeks                   Told patient to return for periodic foot care and evaluation due to potential at risk complications.   Gardiner Barefoot DPM

## 2023-02-07 NOTE — Telephone Encounter (Signed)
Follow Up:  Patient's son is returning Shamea's call from today.

## 2023-02-21 NOTE — Progress Notes (Signed)
Remote pacemaker transmission.   

## 2023-02-24 ENCOUNTER — Ambulatory Visit: Payer: Medicare Other | Attending: Internal Medicine

## 2023-02-24 DIAGNOSIS — I5032 Chronic diastolic (congestive) heart failure: Secondary | ICD-10-CM

## 2023-02-25 LAB — BASIC METABOLIC PANEL
BUN/Creatinine Ratio: 19 (ref 10–24)
BUN: 30 mg/dL (ref 10–36)
CO2: 26 mmol/L (ref 20–29)
Calcium: 9.8 mg/dL (ref 8.6–10.2)
Chloride: 101 mmol/L (ref 96–106)
Creatinine, Ser: 1.6 mg/dL — ABNORMAL HIGH (ref 0.76–1.27)
Glucose: 139 mg/dL — ABNORMAL HIGH (ref 70–99)
Potassium: 3.5 mmol/L (ref 3.5–5.2)
Sodium: 143 mmol/L (ref 134–144)
eGFR: 39 mL/min/{1.73_m2} — ABNORMAL LOW (ref >59)

## 2023-02-26 ENCOUNTER — Encounter: Payer: Self-pay | Admitting: Internal Medicine

## 2023-02-26 ENCOUNTER — Other Ambulatory Visit: Payer: Self-pay | Admitting: Physician Assistant

## 2023-02-26 ENCOUNTER — Ambulatory Visit
Admission: RE | Admit: 2023-02-26 | Discharge: 2023-02-26 | Disposition: A | Discharge status: Home / Self Care | Payer: Medicare Other | Source: Ambulatory Visit | Attending: Physician Assistant | Admitting: Physician Assistant

## 2023-02-26 DIAGNOSIS — R051 Acute cough: Secondary | ICD-10-CM

## 2023-02-28 MED ORDER — FUROSEMIDE 40 MG PO TABS: 40.0000 mg | ORAL_TABLET | Freq: Every day | ORAL | 3 refills | Status: DC

## 2023-03-04 ENCOUNTER — Telehealth: Payer: Self-pay | Admitting: Internal Medicine

## 2023-03-04 DIAGNOSIS — I5032 Chronic diastolic (congestive) heart failure: Secondary | ICD-10-CM

## 2023-03-04 NOTE — Telephone Encounter (Signed)
Please see previous telephone encounter for details.

## 2023-03-04 NOTE — Telephone Encounter (Signed)
Patient's son [name] called back stating he was speaking with Dr. Oralia Rud nurse and the phone got cut off.  He was calling back to speak with her.

## 2023-03-04 NOTE — Telephone Encounter (Signed)
Spoke with [name] ok per DPR.  Will bring pt in for labs on 03/12/23.  Reports PCP called in an antibiotic for pt cough.  Has no concerns regarding pt at this time. Advised to call back with concerns.

## 2023-03-04 NOTE — Telephone Encounter (Signed)
Patient's son states his medication was changed on Friday and he was advised to come back for repeat lab work in 2 weeks but no orders have been placed.

## 2023-03-12 ENCOUNTER — Ambulatory Visit: Payer: Medicare Other | Attending: Internal Medicine

## 2023-03-12 DIAGNOSIS — I5032 Chronic diastolic (congestive) heart failure: Secondary | ICD-10-CM

## 2023-03-13 LAB — BASIC METABOLIC PANEL
BUN/Creatinine Ratio: 18 (ref 10–24)
BUN: 29 mg/dL (ref 10–36)
CO2: 27 mmol/L (ref 20–29)
Calcium: 9.5 mg/dL (ref 8.6–10.2)
Chloride: 100 mmol/L (ref 96–106)
Creatinine, Ser: 1.61 mg/dL — ABNORMAL HIGH (ref 0.76–1.27)
Glucose: 131 mg/dL — ABNORMAL HIGH (ref 70–99)
Potassium: 3.5 mmol/L (ref 3.5–5.2)
Sodium: 143 mmol/L (ref 134–144)
eGFR: 39 mL/min/{1.73_m2} — ABNORMAL LOW (ref >59)

## 2023-03-30 ENCOUNTER — Other Ambulatory Visit: Payer: Self-pay | Admitting: Internal Medicine

## 2023-03-31 NOTE — Telephone Encounter (Signed)
Okay to refill? If so, how many refills? He has follow up with you 06/2023

## 2023-04-23 ENCOUNTER — Ambulatory Visit (INDEPENDENT_AMBULATORY_CARE_PROVIDER_SITE_OTHER): Payer: Medicare Other

## 2023-04-23 DIAGNOSIS — I495 Sick sinus syndrome: Secondary | ICD-10-CM

## 2023-04-23 LAB — CUP PACEART REMOTE DEVICE CHECK
Battery Remaining Longevity: 17 mo
Battery Voltage: 2.88 V
Brady Statistic AP VP Percent: 99.76 %
Brady Statistic AP VS Percent: 0 %
Brady Statistic AS VP Percent: 0 %
Brady Statistic AS VS Percent: 0.24 %
Brady Statistic RA Percent Paced: 99.99 %
Brady Statistic RV Percent Paced: 99.76 %
Date Time Interrogation Session: 20240423223433
Implantable Lead Connection Status: 753985
Implantable Lead Connection Status: 753985
Implantable Lead Implant Date: 20191206
Implantable Lead Implant Date: 20191206
Implantable Lead Location: 753860
Implantable Lead Location: 753860
Implantable Lead Model: 3830
Implantable Lead Model: 5076
Implantable Pulse Generator Implant Date: 20191206
Lead Channel Impedance Value: 304 Ohm
Lead Channel Impedance Value: 361 Ohm
Lead Channel Impedance Value: 399 Ohm
Lead Channel Impedance Value: 418 Ohm
Lead Channel Sensing Intrinsic Amplitude: 10.875 mV
Lead Channel Sensing Intrinsic Amplitude: 10.875 mV
Lead Channel Sensing Intrinsic Amplitude: 5.125 mV
Lead Channel Sensing Intrinsic Amplitude: 5.125 mV
Lead Channel Setting Pacing Amplitude: 2 V
Lead Channel Setting Pacing Amplitude: 2.5 V
Lead Channel Setting Pacing Pulse Width: 0.4 ms
Lead Channel Setting Sensing Sensitivity: 1.2 mV
Zone Setting Status: 755011
Zone Setting Status: 755011

## 2023-04-25 ENCOUNTER — Ambulatory Visit: Payer: PRIVATE HEALTH INSURANCE | Admitting: Podiatry

## 2023-04-28 ENCOUNTER — Ambulatory Visit (INDEPENDENT_AMBULATORY_CARE_PROVIDER_SITE_OTHER): Payer: Medicare Other | Admitting: Podiatry

## 2023-04-28 ENCOUNTER — Encounter: Payer: Self-pay | Admitting: Podiatry

## 2023-04-28 DIAGNOSIS — M79676 Pain in unspecified toe(s): Secondary | ICD-10-CM

## 2023-04-28 DIAGNOSIS — L608 Other nail disorders: Secondary | ICD-10-CM

## 2023-04-28 DIAGNOSIS — E1142 Type 2 diabetes mellitus with diabetic polyneuropathy: Secondary | ICD-10-CM

## 2023-04-28 DIAGNOSIS — B351 Tinea unguium: Secondary | ICD-10-CM | POA: Diagnosis not present

## 2023-04-28 NOTE — Progress Notes (Signed)
This patient returns to my office for at risk foot care.  This patient requires this care by a professional since this patient will be at risk due to having peripheral neuropathy, CKD, Coagulation defect and thrombocytopenia.  This patient is unable to cut nails himself since the patient cannot reach his nails.These nails are painful walking and wearing shoes.  This patient presents for at risk foot care today.  He presents to the office with male son.  He is taking eliquis.  General Appearance  Alert, conversant and in no acute stress.  Vascular  Dorsalis pedis and posterior tibial  pulses are not  palpable  bilaterally.  Capillary return is within normal limits  bilaterally. Temperature is within normal limits  Bilaterally. Swelling feet/legs  Neurologic  Senn-Weinstein monofilament wire test within normal limits  bilaterally. Muscle power within normal limits bilaterally.  Nails Thick disfigured discolored nails with subungual debris  from hallux to fifth toes bilaterally. Pincer nails hallux  B/L. No evidence of bacterial infection or drainage bilaterally.  Orthopedic  No limitations of motion  feet .  No crepitus or effusions noted.  No bony pathology or digital deformities noted.  HAV  B/L.  Skin  normotropic skin with no porokeratosis noted bilaterally.  No signs of infections or ulcers noted.     Onychomycosis  Pain in right toes  Pain in left toes  Consent was obtained for treatment procedures.   Mechanical debridement of nails 1-5  bilaterally performed with a nail nipper.  Filed with dremel without incident. Cauterization lateral border left hallux.   Return office visit  10 weeks                   Told patient to return for periodic foot care and evaluation due to potential at risk complications.   Helane Gunther DPM

## 2023-05-06 ENCOUNTER — Ambulatory Visit: Payer: Medicare Other | Admitting: Internal Medicine

## 2023-05-19 ENCOUNTER — Encounter (INDEPENDENT_AMBULATORY_CARE_PROVIDER_SITE_OTHER): Payer: Medicare Other | Admitting: Ophthalmology

## 2023-05-20 NOTE — Progress Notes (Signed)
Remote pacemaker transmission.   

## 2023-07-03 ENCOUNTER — Emergency Department (HOSPITAL_BASED_OUTPATIENT_CLINIC_OR_DEPARTMENT_OTHER)
Admission: EM | Admit: 2023-07-03 | Discharge: 2023-07-03 | Disposition: A | Discharge status: Home / Self Care | Payer: Medicare Other | Attending: Emergency Medicine | Admitting: Emergency Medicine

## 2023-07-03 ENCOUNTER — Other Ambulatory Visit: Payer: Self-pay

## 2023-07-03 ENCOUNTER — Encounter (HOSPITAL_BASED_OUTPATIENT_CLINIC_OR_DEPARTMENT_OTHER): Payer: Self-pay

## 2023-07-03 DIAGNOSIS — Z87891 Personal history of nicotine dependence: Secondary | ICD-10-CM | POA: Diagnosis not present

## 2023-07-03 DIAGNOSIS — Z7901 Long term (current) use of anticoagulants: Secondary | ICD-10-CM | POA: Insufficient documentation

## 2023-07-03 DIAGNOSIS — I130 Hypertensive heart and chronic kidney disease with heart failure and stage 1 through stage 4 chronic kidney disease, or unspecified chronic kidney disease: Secondary | ICD-10-CM | POA: Insufficient documentation

## 2023-07-03 DIAGNOSIS — Z950 Presence of cardiac pacemaker: Secondary | ICD-10-CM | POA: Diagnosis not present

## 2023-07-03 DIAGNOSIS — N189 Chronic kidney disease, unspecified: Secondary | ICD-10-CM | POA: Diagnosis not present

## 2023-07-03 DIAGNOSIS — I5032 Chronic diastolic (congestive) heart failure: Secondary | ICD-10-CM | POA: Insufficient documentation

## 2023-07-03 DIAGNOSIS — R339 Retention of urine, unspecified: Secondary | ICD-10-CM | POA: Diagnosis not present

## 2023-07-03 DIAGNOSIS — T839XXA Unspecified complication of genitourinary prosthetic device, implant and graft, initial encounter: Secondary | ICD-10-CM

## 2023-07-03 DIAGNOSIS — T83091A Other mechanical complication of indwelling urethral catheter, initial encounter: Secondary | ICD-10-CM | POA: Insufficient documentation

## 2023-07-03 MED ORDER — LIDOCAINE HCL URETHRAL/MUCOSAL 2 % EX GEL: 1.0000 | Freq: Once | CUTANEOUS | Status: DC | PRN

## 2023-07-03 NOTE — ED Notes (Signed)
Urine meter and bag switched to a leg bag. Pt has plenty extra at home and knows how to use them.

## 2023-07-03 NOTE — ED Provider Notes (Signed)
[place] [facility]ERGENCY DEPARTMENT AT [facility] Provider Note   CSN: [id number] Arrival date & time: 07/03/23  1841     History  Chief Complaint  Patient presents with   Urinary Retention    Douglas Walker is a 89 y.o. male.  Patient with history of longstanding indwelling Foley catheter.  Catheter is stopped draining properly.  And it is leaking around it.  Patient also with some increased abdominal discomfort.  Patient scheduled this follow-up with [facility] this week coming up to have it changed out.  No fevers no clinical evidence of urinary tract infection.  Patient cultures in the past have been somewhat polymicrobial.  Patient is also on Eliquis.  No bleeding.  Past medical history sniffer hypertension hyperlipidemia TIAs atrial fibrillation this was on the Eliquis for tachybradycardia syndrome.  Chronic low back pain chronic kidney disease pulmonary hypertension chronic diastolic heart failure thrombocytopenia.  Patient had pacemaker implant in 2019.  Appendectomy in the past patient former smoker quit 1964.  Patient's last seen in the emergency department in January for sepsis with admission to [facility] long.       Home Medications Prior to Admission medications   Medication Sig Start Date End Date Taking? Authorizing Provider  acetaminophen (TYLENOL) 325 MG tablet Take 2 tablets (650 mg total) by mouth every 4 (four) hours as needed for mild pain (or temp > 37.5 C (99.5 F)). 02/14/21   Angiulli, Mcarthur Rossetti, PA-C  apixaban (ELIQUIS) 2.5 MG TABS tablet Take 1 tablet (2.5 mg total) by mouth 2 (two) times daily. 09/24/22   Dunn, Tacey Ruiz, PA-C  Ascorbic Acid (VITAMIN C) 1000 MG tablet Take 1,000 mg by mouth daily.    [provider]  DULoxetine (CYMBALTA) 60 MG capsule Take 1 capsule (60 mg total) by mouth daily. 04/23/21   Lovorn, Aundra Millet, MD  furosemide (LASIX) 40 MG tablet Take 1 tablet (40 mg total) by mouth daily. 02/28/23   Christell Constant, MD   levothyroxine (SYNTHROID) 25 MCG tablet Take 1 tablet (25 mcg total) by mouth daily before breakfast. 02/14/21   Angiulli, Mcarthur Rossetti, PA-C  loratadine (CLARITIN) 10 MG tablet Take 10 mg by mouth daily.    [provider]  midodrine (PROAMATINE) 2.5 MG tablet Take 1 tablet (2.5 mg total) by mouth 3 (three) times daily with meals. Patient taking differently: Take 2.5 mg by mouth as needed (Low BP). 10/22/22   Willeen Niece, MD  Multiple Vitamin (MULTIVITAMIN WITH MINERALS) TABS tablet Take 1 tablet by mouth daily.    [provider]  Multiple Vitamins-Minerals (OCUVITE ADULT 50+) CAPS Take 1 capsule by mouth daily.    [provider]  nitrofurantoin, macrocrystal-monohydrate, (MACROBID) 100 MG capsule Take 1 capsule by mouth at bedtime 03/31/23   Vu, Trung T, MD  polyethylene glycol (MIRALAX / GLYCOLAX) 17 g packet Take 17 g by mouth daily. Patient taking differently: Take 17 g by mouth daily as needed for mild constipation. 07/08/21   Narda Bonds, MD  predniSONE (DELTASONE) 5 MG tablet Take 5 mg by mouth daily. 09/12/22   [provider]  pregabalin (LYRICA) 50 MG capsule Take 50 mg by mouth at bedtime.    [provider]  topiramate (TOPAMAX) 50 MG tablet Take 1 tablet (50 mg total) by mouth 2 (two) times daily. Patient taking differently: Take 50 mg by mouth daily. 02/27/22   Butch Penny, NP  traMADol (ULTRAM) 50 MG tablet daily at 6 (six) AM. 01/22/23  [provider]  vitamin B-12 (CYANOCOBALAMIN) 1000 MCG tablet Take 1 tablet (1,000 mcg total) by mouth every other day. Patient taking differently: Take 1,000 mcg by mouth See admin instructions. oral daily on Sunday, Tuesday, Thursday, and Saturday, repeat. 02/14/21   Angiulli, Mcarthur Rossetti, PA-C      Allergies    Sulfa antibiotics, Mysoline [primidone], and Zocor [simvastatin]    Review of Systems   Review of Systems  Constitutional:  Negative for chills and fever.  HENT:   Negative for ear pain and sore throat.   Eyes:  Negative for pain and visual disturbance.  Respiratory:  Negative for cough and shortness of breath.   Cardiovascular:  Negative for chest pain and palpitations.  Gastrointestinal:  Positive for abdominal pain. Negative for vomiting.  Genitourinary:  Positive for difficulty urinating. Negative for dysuria and hematuria.  Musculoskeletal:  Negative for arthralgias and back pain.  Skin:  Negative for color change and rash.  Neurological:  Negative for seizures and syncope.  All other systems reviewed and are negative.   Physical Exam Updated Vital Signs BP 101/62 (BP Location: Right Arm)   Pulse 66   Temp 98.6 F (37 C) (Oral)   Resp 18   SpO2 93%  Physical Exam Vitals and nursing note reviewed.  Constitutional:      General: He is not in acute distress.    Appearance: Normal appearance. He is well-developed.  HENT:     Head: Normocephalic and atraumatic.  Eyes:     Extraocular Movements: Extraocular movements intact.     Conjunctiva/sclera: Conjunctivae normal.     Pupils: Pupils are equal, round, and reactive to light.  Cardiovascular:     Rate and Rhythm: Normal rate and regular rhythm.     Heart sounds: No murmur heard. Pulmonary:     Effort: Pulmonary effort is normal. No respiratory distress.     Breath sounds: Normal breath sounds.  Abdominal:     Palpations: Abdomen is soft. There is mass.     Tenderness: There is no abdominal tenderness.     Comments: Suprapubic mass most likely distended bladder.  Genitourinary:    Penis: Normal.      Comments: Urine leaking around the Foley catheter.  No significant scrotal swelling. Musculoskeletal:        General: No swelling.     Cervical back: Normal range of motion and neck supple.  Skin:    General: Skin is warm and dry.     Capillary Refill: Capillary refill takes less than 2 seconds.  Neurological:     General: No focal deficit present.     Mental Status: He is alert.  Mental status is at baseline.  Psychiatric:        Mood and Affect: Mood normal.     ED Results / Procedures / Treatments   Labs (all labs ordered are listed, but only abnormal results are displayed) Labs Reviewed - No data to display  EKG None  Radiology No results found.  Procedures Procedures    Medications Ordered in ED Medications  lidocaine (XYLOCAINE) 2 % jelly 1 Application (has no administration in time range)    ED Course/ Medical Decision Making/ A&P                             Medical Decision Making Risk Prescription drug management.   Patient's catheter changed out here with good flow.  Patient feeling much better.  Urine looks fairly clear will not send for urinalysis is most likely will be contaminated.  Patient clinically without any evidence of urinary tract infection.  Switched over to a leg bag.  Patient has follow-up with [facility] this week. Final Clinical Impression(s) / ED Diagnoses Final diagnoses:  Urinary retention  Problem with Foley catheter, initial encounter [facility])    Rx / DC Orders ED Discharge Orders     None         Vanetta Mulders, MD 07/03/23 2341

## 2023-07-03 NOTE — ED Triage Notes (Signed)
Pt c/o blocked catheter, states it started slowing "a couple days ago." Appt next week w [facility]. Completely stopped flowing today. Pt c/o "a little" abd pain, "but not right now."

## 2023-07-03 NOTE — Discharge Instructions (Signed)
Follow-up with urology [facility] as scheduled.  Return for any new or worse symptoms.

## 2023-07-07 ENCOUNTER — Ambulatory Visit (INDEPENDENT_AMBULATORY_CARE_PROVIDER_SITE_OTHER): Payer: Medicare Other | Admitting: Podiatry

## 2023-07-07 DIAGNOSIS — M79676 Pain in unspecified toe(s): Secondary | ICD-10-CM

## 2023-07-07 DIAGNOSIS — B351 Tinea unguium: Secondary | ICD-10-CM

## 2023-07-07 DIAGNOSIS — E1142 Type 2 diabetes mellitus with diabetic polyneuropathy: Secondary | ICD-10-CM

## 2023-07-07 NOTE — Progress Notes (Signed)
This patient returns to my office for at risk foot care.  This patient requires this care by a professional since this patient will be at risk due to having peripheral neuropathy, CKD, Coagulation defect and thrombocytopenia.  This patient is unable to cut nails himself since the patient cannot reach his nails.These nails are painful walking and wearing shoes.  This patient presents for at risk foot care today.  He presents to the office with male son.  He is taking eliquis.  General Appearance  Alert, conversant and in no acute stress.  Vascular  Dorsalis pedis and posterior tibial  pulses are not  palpable  bilaterally.  Capillary return is within normal limits  bilaterally. Cold feet. Bilaterally. Swelling feet/legs  Neurologic  Senn-Weinstein monofilament wire test within normal limits  bilaterally. Muscle power within normal limits bilaterally.  Nails Thick disfigured discolored nails with subungual debris  from hallux to fifth toes bilaterally. Pincer nails hallux  B/L. No evidence of bacterial infection or drainage bilaterally.  Orthopedic  No limitations of motion  feet .  No crepitus or effusions noted.  No bony pathology or digital deformities noted.  HAV  B/L.  Skin  normotropic skin with no porokeratosis noted bilaterally.  No signs of infections or ulcers noted.     Onychomycosis  Pain in right toes  Pain in left toes  Consent was obtained for treatment procedures.   Mechanical debridement of nails 1-5  bilaterally performed with a nail nipper.  Filed with dremel without incident. Cauterization lateral border left hallux.  Patient says he is having severe pain in toes due to toenails.  No evidence of redness swelling  to nails.  I discussed the pain and believe it is due to vascular status.  Told the son and we will wait until later for vascular studies.   Return office visit  10 weeks                   Told patient to return for periodic foot care and evaluation due to potential at  risk complications.   Helane Gunther DPM

## 2023-07-23 ENCOUNTER — Ambulatory Visit (INDEPENDENT_AMBULATORY_CARE_PROVIDER_SITE_OTHER): Payer: Medicare Other

## 2023-07-23 DIAGNOSIS — I495 Sick sinus syndrome: Secondary | ICD-10-CM | POA: Diagnosis not present

## 2023-07-24 LAB — CUP PACEART REMOTE DEVICE CHECK
Battery Voltage: 2.86 V
Brady Statistic AP VP Percent: 99.72 %
Brady Statistic AP VS Percent: 0 %
Brady Statistic AS VP Percent: 0 %
Brady Statistic RV Percent Paced: 99.72 %
Date Time Interrogation Session: 20240723235540
Implantable Lead Connection Status: 753985
Implantable Lead Connection Status: 753985
Implantable Lead Implant Date: 20191206
Implantable Lead Implant Date: 20191206
Implantable Lead Location: 753860
Implantable Lead Location: 753860
Implantable Lead Model: 3830
Implantable Lead Model: 5076
Implantable Pulse Generator Implant Date: 20191206
Lead Channel Impedance Value: 304 Ohm
Lead Channel Impedance Value: 361 Ohm
Lead Channel Impedance Value: 418 Ohm
Lead Channel Sensing Intrinsic Amplitude: 15.875 mV
Lead Channel Sensing Intrinsic Amplitude: 15.875 mV
Lead Channel Sensing Intrinsic Amplitude: 5.125 mV
Lead Channel Sensing Intrinsic Amplitude: 5.125 mV
Lead Channel Setting Pacing Amplitude: 2 V
Lead Channel Setting Pacing Amplitude: 2.5 V
Lead Channel Setting Pacing Pulse Width: 0.4 ms
Lead Channel Setting Sensing Sensitivity: 1.2 mV
Zone Setting Status: 755011
Zone Setting Status: 755011

## 2023-07-29 ENCOUNTER — Ambulatory Visit: Payer: Medicare Other | Admitting: Internal Medicine

## 2023-07-29 ENCOUNTER — Encounter: Payer: Self-pay | Admitting: Internal Medicine

## 2023-07-29 ENCOUNTER — Other Ambulatory Visit: Payer: Self-pay

## 2023-07-29 ENCOUNTER — Ambulatory Visit (INDEPENDENT_AMBULATORY_CARE_PROVIDER_SITE_OTHER): Payer: Medicare Other | Admitting: Internal Medicine

## 2023-07-29 VITALS — BP 126/65 | HR 70 | Temp 97.6°F | Wt 184.0 lb

## 2023-07-29 DIAGNOSIS — N390 Urinary tract infection, site not specified: Secondary | ICD-10-CM | POA: Diagnosis not present

## 2023-07-29 NOTE — Patient Instructions (Signed)
No new antibiotics or urine testing unless patient have fever, persistent confusion not improving with suportive care (hydration, psychotropic meds withdrawal, pain/constipation...)    Continue once a day macrobid   See me in 1 year (6 months or sooner if desired)

## 2023-07-29 NOTE — Progress Notes (Signed)
[facility]  Cc- f/u MDRO recurrent uti      Patient Active Problem List   Diagnosis Date Noted   Aneurysm of ascending aorta without rupture (HCC) 02/04/2023   Coronary artery calcification 02/04/2023   Aortic atherosclerosis (HCC) 02/04/2023   Hematuria 01/02/2023   FTT (failure to thrive) in adult 01/02/2023   Severe sepsis (HCC) 10/13/2022   Nausea and vomiting 10/13/2022   Normocytic anemia 10/13/2022   AAA (abdominal aortic aneurysm) (HCC) 10/13/2022   Physical deconditioning 08/29/2022   Eye pain, right 08/10/2022   Blind painful right eye 08/08/2022   Vitreous hemorrhage of right eye (HCC) 08/05/2022   Choroidal hemorrhage, right 08/05/2022   Secondary glaucoma due to combination mechanisms, right, moderate stage 08/05/2022   History of corneal transplant 05/16/2022   Chronic indwelling Foley catheter 01/31/2022   Benign prostatic hyperplasia with lower urinary tract symptoms 01/31/2022   Medication monitoring encounter 01/31/2022   Multiple drug resistant organism (MDRO) culture positive 01/31/2022   Bacteremia due to Proteus species 10/11/2021   Complicated UTI (urinary tract infection) 07/02/2021   Pain in both testicles    Right hemiparesis (HCC) 04/23/2021   Cerebrovascular accident (CVA) due to embolism of left middle cerebral artery (HCC) 04/23/2021   Neuropathy 04/23/2021   Abnormality of gait 04/23/2021   Abnormal results of thyroid function studies 04/04/2021   Acquired iron deficiency anemia due to decreased absorption 04/04/2021   Acquired thrombophilia (HCC) 04/04/2021   Cerebral atherosclerosis 04/04/2021   Basal cell carcinoma of nose 04/04/2021   Benign hypertensive heart and renal disease, with heart and renal failure (HCC) 04/04/2021   Benign prostatic hyperplasia 04/04/2021   Obesity 04/04/2021   Bradycardia 04/04/2021   Closed fracture of one rib 04/04/2021   Constipation by delayed colonic transit  04/04/2021   Fatigue 04/04/2021   Gastro-esophageal reflux disease without esophagitis 04/04/2021   Hypothyroidism 04/04/2021   Left lower quadrant pain 04/04/2021   Lumbar radiculopathy 04/04/2021   Reactive depression 04/04/2021   Mass of chest wall 04/04/2021   Mixed hyperlipidemia 04/04/2021   Nocturia 04/04/2021   Other shoulder lesions, right shoulder 04/04/2021   Pain in limb 04/04/2021   Peripheral edema 04/04/2021   Personal history of malignant neoplasm of bladder 04/04/2021   Pruritus of genitalia 04/04/2021   Tremor 04/04/2021   Unilateral primary osteoarthritis, left knee 04/04/2021   Unspecified mononeuropathy of unspecified lower limb 04/04/2021   Urinary incontinence 04/04/2021   Dysphagia, post-stroke    Chronic diastolic congestive heart failure (HCC)    Chronic pain syndrome    Left middle cerebral artery stroke (HCC) 02/01/2021   Pacemaker 08/30/2020   Hematoma, chest wall 07/24/2020   Exudative age-related macular degeneration of right eye with inactive choroidal neovascularization (HCC) 05/10/2020   Exudative age-related macular degeneration of left eye with inactive choroidal neovascularization (HCC) 05/10/2020   Retinal hemorrhage of right eye 05/10/2020   Exposure keratopathy, bilateral 05/10/2020   Bilateral dry eyes 05/10/2020   Advanced nonexudative age-related macular degeneration of both eyes with subfoveal involvement 05/10/2020   Complete heart block (HCC) 12/04/2018   Chronic low back pain 12/15/2017   Hereditary and idiopathic peripheral neuropathy 07/01/2016   Essential tremor 02/28/2016   BPPV (benign paroxysmal positional vertigo) 06/19/2015   Dizziness 06/18/2015   Stage 3a chronic kidney disease (CKD) (HCC) 06/18/2015   Chronic diastolic CHF (congestive heart failure) (HCC) 05/30/2015   Fall    Syncope 05/19/2015   Lower urinary  tract infectious disease 05/19/2015   Hypotension 05/19/2015   Fracture of rib of left side 05/19/2015    Syncope and collapse 05/19/2015   Mobitz type II atrioventricular block 09/13/2014   Essential hypertension 09/13/2014   Chronic anticoagulation 09/13/2014   SSS (sick sinus syndrome) (HCC) 09/13/2014   Paroxysmal atrial fibrillation (HCC) 10/28/2013   Thrombocytopenia (HCC) 04/06/2013    Cc -- f/u recurrent uti/mdro   HPI: SHAHZAIB PRICHARD is a 89 y.o. male hx pacemaker (implanted 2019), neuropathy, LUTS in setting bph, hx chronic prostatitis, left MCA cva, chf with preserved ef, ckd3, pAfib on eliquis, hx low-grade transitional cell carcinoma of bladder 2005 (no recurrence on subsequent cystoscopy), referred here to id clinic for hx of MDRO uti. Today patient is here for routine follow up    07/29/23 id clinic f/u See a&p for details   01/28/23 id clinic visit Patient had an admission 1/3-01/06/23 at [facility] for gross hematuria seen in his foley catheter bag. Saw urology the day before that and had catheter exchanged when hematuria happened immediately after, and had irrigation; subsequently had abd pain/nausea in setting ongoing hematuria. His urologist advised him to stop eliquis. He presented to the ed on 1/03 with sepsis. Ucx only with 20k esbl (remains sensitive to macrobid). Was given amox-clav (didn't discuss with id) after he was given a dose meropenem in the ed.  I called him subsequently and advise to restart macrobid as well after he discharged home. Back to baseline No cough No dyspnea No rash Back on eliquis when he was discharged Catheter last chagned 1/23 no further bleeding since admission     07/24/22 id clinic visit Patient doing well no uti since I last saw him, might be some gap in his macrobid as he "just started" a couple weeks ago Stable legs swelling Some sob with laying flat chronically Foley changed every 3 weeks  No complaint today    ----------- Reviewed outside record: He has been seen by [facility] urology who has recently recommended  continuous bladder drainage with urinary catheter for bph/luts (at least 3 months prior to 11/01/2021 visit, to be changed every 3 weeks) Patient noted to have had 2 uti requiring hospital admission for sepsis. One in 06/2024 with esbl, and one 10/25/21 with proteus. Both given meropenem. He has imaging which showed no GU stone  He appears to have been placed on cephalexin chronic suppression  Previous cx: 10/27 [facility] urine cx >100k proteus mirabilis (R amp/sulb, ceftriaxone, cefazolin, cipro, cefotetan, nitrofurantoin, bactrim; S ertapenem, tobra, amikacin) 09/01 [facility] urine cx > 100k pseudomonas (pan sensitive piptazo, meropenem, cipro, ceftaz, aztreonam, gent) 07/25 [facility] ucx >100k e coli esbl (R cipro; s amikacin, ertapenem, nitrofurantoin, gent, bactrim) 06/09 [facility] citrobacter murliniae (R ceftriaxone, cefotaxim, cipro; S nitrofurantoin, gent, amikacin, nitrofurantoin) 11/2020 [facility] esbl ecoli (R cipro; S gent, amikacin, nitrofurantoin, bactrim)     Of note, patient was on macrobid when his first sepsis (06/2021) occurred. Patient was on macrobid for about 3 months   He wasn't on any other prophylaxis at that time   He wasn't on methanamene before. But he is getting cranberry/vit c   ----------------- 04/30/22 id clinic f/u He saw my partner Dr Elinor Parkinson for follow up on 01/31/22, doing well without recurrent uti on suppressive macrobid He had peri-foley urinary leakage at that time. He had a HH nurse trying to change the foley three times for 45 minutes per his son [name]'s report unsuccessfully. He then went to  the urologist and had 1 placed in the clinic  He has had slight bloody drainage. He last have it changed again a week ago -- no gross blood.  He has been putting on neosporin. I had told him to stop neosporin via mychart the last 24-48 hours  He can see some bloody stain on the depend though   No fever/chill/flank pain, suprapubic pain  He has  been complaining of discomfort in the urethral area  but no f/c, malaise, altered mentation, lethargy.    11/05/22 id clinic f/u Patient admitted 10/15-24 for sepsis thought to be uti Foley cateter exchanged. Urine cx with old foley polymicrobial; replaced foley ucx negative. Ct abd/pelv suggest bladder diverticula rather than abscess per urology evaluation Completed 10 days meropenem Blood cx negative Was on macrobid prior to that for prophylaxis  Reviewed cx again; prior esbl ecoli (S bactrim; R cipro; I macrobid); proteus mirabilis (s Bactrim; R macrobid)  He was on macrobid for more than a year prior to this new infection.   Patient had visual hallucination when he was in hospital and a few days after discharge but no longer present. He is at baseline function now   He is on midodrine for hypotension. He is also on low dose lasix. His heart doctor at this time wants to keep him on lasix He is also on pred 5 mg daily and topamax, lyrica, cymbalta He also takes tramadol 50 mg qhs scheduled   Reviewed chart from 09/2022 admission  Review of Systems: ROS All other ROS negative       Past Medical History:  Diagnosis Date   Anemia    Arthritis    shoulders and back   Cancer (HCC)    skin cancers   Chronic diastolic (congestive) heart failure (HCC)    Chronic low back pain 12/15/2017   CKD (chronic kidney disease) stage 3, GFR 30-59 ml/min (HCC)    Essential tremor 02/28/2016   GERD (gastroesophageal reflux disease)    Hernia    History of hiatal hernia    Hyperlipemia    Hypertension    Macular degeneration    Neuromuscular disorder (HCC)    neuropathy   Paroxysmal atrial fibrillation (HCC) 10/28/2013   Presence of permanent cardiac pacemaker 12/04/2018   Pulmonary hypertension (HCC)    Stroke (HCC) 12/2020   Tachycardia-bradycardia syndrome (HCC) 09/13/2014   Thoracic aortic aneurysm (HCC)    Thrombocytopenia (HCC)    TIA (transient ischemic attack)      Social History   Tobacco Use   Smoking status: Former    Current packs/day: 0.00    Types: Cigarettes    Quit date: 12/30/1962    Years since quitting: 60.6   Smokeless tobacco: Never  Vaping Use   Vaping status: Never Used  Substance Use Topics   Alcohol use: No   Drug use: No    Family History  Problem Relation Age of Onset   GI problems Mother    Other Sister        PAIN ISSUES   Hearing loss Sister    Blindness Sister    Heart attack Neg Hx    Stroke Neg Hx     Allergies  Allergen Reactions   Sulfa Antibiotics Other (See Comments)    Weakness Dizziness  Sweats    Mysoline [Primidone] Other (See Comments)    Sedation    Zocor [Simvastatin] Other (See Comments)    Arthralgias Fatigue    OBJECTIVE: There were no vitals  filed for this visit.  There is no height or weight on file to calculate BMI.   Physical Exam General/constitutional: no distress, pleasant HEENT: Normocephalic, PER, Conj Clear, EOMI, Oropharynx clear Neck supple CV: rrr no mrg Lungs: clear to auscultation, normal respiratory effort Abd: Soft, Nontender Ext: no edema Skin: No Rash   Neuro: nonfocal; resting tremor; hard at hearing, wears hearing aides  Foley in place   Lab: Lab Results  Component Value Date   WBC 4.7 01/06/2023   HGB 10.2 (L) 01/06/2023   HCT 31.0 (L) 01/06/2023   MCV 96.6 01/06/2023   PLT 57 (L) 01/06/2023   Last metabolic panel Lab Results  Component Value Date   GLUCOSE 131 (H) 03/12/2023   NA 143 03/12/2023   K 3.5 03/12/2023   CL 100 03/12/2023   CO2 27 03/12/2023   BUN 29 03/12/2023   CREATININE 1.61 (H) 03/12/2023   GFRNONAA 54 (L) 01/06/2023   CALCIUM 9.5 03/12/2023   PHOS 3.2 01/03/2023   PROT 6.2 (L) 01/03/2023   ALBUMIN 3.5 01/03/2023   BILITOT 0.6 01/03/2023   ALKPHOS 72 01/03/2023   AST 20 01/03/2023   ALT 26 01/03/2023   ANIONGAP 8 01/06/2023    Microbiology:  Serology:  Imaging:   Assessment/plan: Problem List  Items Addressed This Visit   None Visit Diagnoses     Recurrent UTI    -  Primary   Relevant Orders   CBC   COMPLETE METABOLIC PANEL WITH GFR       Recurrent UTI/MDR UTI  H/o Transitional cell ca of bladder on remission BPH with LUTS s/p chronic indwelling Foleys  As of 11/01/2021 per patient's son, he has had several's uti at least 8 a year  Discussed assymptomatic bacteriuria and risk of this with his bph, foley He does seem to have admissions related to uti/sepsis 06/2021 and 09/2021.  Discuss things to avoid looking at in terms of dx for uti such as odor  Advise patient and his son to call our clinic if concern for uti  Reviewed micros form the past year; majority is sensitive to macrobid. Reviewed current evidence of various gfr which doesn't affect macrobid if >30 mL/min  Discuss with patient and his son also the failure is determined long term (over 6 months to a year) in terms of seeing reduction in uti frequency. I do not think he has failed macrobid  All his previous bacteria are resistant to cephalexin   ------------ 04/30/22 Doing well without clinical evidence of uti His son had discussed spc before but at this time not ready Likely having urethral/penile meatus irritation via trauma with recent repeated foley placement  -stop neosporin -use vaseline as needed for skin care -- if urology ok with lidocaine ointment could use for symptoms control at this time -look for sign of infection such as suprapubic pain/flank pain, fever, chill, lethargy/malaise and to call our office immediately  -continue macrobid for uti prophy -monitor for sign of lung pneumonitis  11/05/22 assessment Patient had done well for 1 year without uti on macrobid prior to 09/2022 and wants to continue that. We discuss potentially changing to bactrim if he has frequent recurrent uti  Chf/hyptension/polypharmacy can discuss with cards/neurology/pcp to cut down as needed as these can confound  uti evaluation  I would advise at least today dc tramadol.    01/28/23 Had another uti in setting traumatic foley placement causing hematuria/requiring flushing not via CBI. Esbl grew again but resolved with amox-clav (after  a dose meropenem 1/03) Back on macrobid doing well Continue macrobid F/u 6 months    07/29/23 id clinic assessment 07/03/23 ed visit for lower abd discomfort in setting urine leak around foley. Catheter was changed and lower abdominal discomfort immediately improved. Not treated for uti and urine not tested He is tolerating macrobid one tablet once a day. No cough, chest pain, fever   York Spaniel he had routine foley change 05/2023 with new urologist and a ua was tested without patient's sx for uti and given levaquin  Advise patient to test if immediate history suggestive of uti only.     I have spent a total of 30 minutes of face-to-face and non-face-to-face time, excluding clinical staff time, preparing to see patient, ordering tests and/or medications, and provide counseling the patient   Follow-up: No follow-ups on file.  Raymondo Band, MD [facility] [phone] pager   [phone] cell 07/29/2023, 2:26 PM

## 2023-07-30 ENCOUNTER — Encounter: Payer: Self-pay | Admitting: Internal Medicine

## 2023-07-30 ENCOUNTER — Inpatient Hospital Stay (HOSPITAL_COMMUNITY)
Admission: EM | Admit: 2023-07-30 | Discharge: 2023-08-12 | DRG: 871 | Disposition: A | Discharge status: Skilled Nursing Facility | Payer: Medicare Other | Attending: Internal Medicine | Admitting: Internal Medicine

## 2023-07-30 ENCOUNTER — Other Ambulatory Visit: Payer: Self-pay

## 2023-07-30 ENCOUNTER — Emergency Department (HOSPITAL_COMMUNITY): Payer: Medicare Other

## 2023-07-30 ENCOUNTER — Encounter (HOSPITAL_COMMUNITY): Payer: Self-pay

## 2023-07-30 DIAGNOSIS — Z87891 Personal history of nicotine dependence: Secondary | ICD-10-CM

## 2023-07-30 DIAGNOSIS — R6521 Severe sepsis with septic shock: Secondary | ICD-10-CM | POA: Diagnosis present

## 2023-07-30 DIAGNOSIS — N1831 Chronic kidney disease, stage 3a: Secondary | ICD-10-CM | POA: Diagnosis present

## 2023-07-30 DIAGNOSIS — E785 Hyperlipidemia, unspecified: Secondary | ICD-10-CM | POA: Diagnosis present

## 2023-07-30 DIAGNOSIS — I5032 Chronic diastolic (congestive) heart failure: Secondary | ICD-10-CM | POA: Diagnosis present

## 2023-07-30 DIAGNOSIS — I495 Sick sinus syndrome: Secondary | ICD-10-CM | POA: Diagnosis present

## 2023-07-30 DIAGNOSIS — R21 Rash and other nonspecific skin eruption: Secondary | ICD-10-CM | POA: Diagnosis present

## 2023-07-30 DIAGNOSIS — Z8551 Personal history of malignant neoplasm of bladder: Secondary | ICD-10-CM

## 2023-07-30 DIAGNOSIS — N400 Enlarged prostate without lower urinary tract symptoms: Secondary | ICD-10-CM | POA: Diagnosis present

## 2023-07-30 DIAGNOSIS — I130 Hypertensive heart and chronic kidney disease with heart failure and stage 1 through stage 4 chronic kidney disease, or unspecified chronic kidney disease: Secondary | ICD-10-CM | POA: Diagnosis present

## 2023-07-30 DIAGNOSIS — R6520 Severe sepsis without septic shock: Secondary | ICD-10-CM

## 2023-07-30 DIAGNOSIS — N390 Urinary tract infection, site not specified: Secondary | ICD-10-CM | POA: Diagnosis present

## 2023-07-30 DIAGNOSIS — Z79899 Other long term (current) drug therapy: Secondary | ICD-10-CM

## 2023-07-30 DIAGNOSIS — Z7901 Long term (current) use of anticoagulants: Secondary | ICD-10-CM | POA: Diagnosis not present

## 2023-07-30 DIAGNOSIS — L89216 Pressure-induced deep tissue damage of right hip: Secondary | ICD-10-CM | POA: Diagnosis present

## 2023-07-30 DIAGNOSIS — J9601 Acute respiratory failure with hypoxia: Secondary | ICD-10-CM | POA: Diagnosis present

## 2023-07-30 DIAGNOSIS — Z1624 Resistance to multiple antibiotics: Secondary | ICD-10-CM | POA: Diagnosis present

## 2023-07-30 DIAGNOSIS — K219 Gastro-esophageal reflux disease without esophagitis: Secondary | ICD-10-CM | POA: Diagnosis present

## 2023-07-30 DIAGNOSIS — I480 Paroxysmal atrial fibrillation: Secondary | ICD-10-CM | POA: Diagnosis present

## 2023-07-30 DIAGNOSIS — J189 Pneumonia, unspecified organism: Secondary | ICD-10-CM | POA: Diagnosis present

## 2023-07-30 DIAGNOSIS — E039 Hypothyroidism, unspecified: Secondary | ICD-10-CM | POA: Diagnosis present

## 2023-07-30 DIAGNOSIS — I272 Pulmonary hypertension, unspecified: Secondary | ICD-10-CM | POA: Diagnosis present

## 2023-07-30 DIAGNOSIS — Z79890 Hormone replacement therapy (postmenopausal): Secondary | ICD-10-CM

## 2023-07-30 DIAGNOSIS — Z1152 Encounter for screening for COVID-19: Secondary | ICD-10-CM

## 2023-07-30 DIAGNOSIS — Z950 Presence of cardiac pacemaker: Secondary | ICD-10-CM

## 2023-07-30 DIAGNOSIS — Z821 Family history of blindness and visual loss: Secondary | ICD-10-CM

## 2023-07-30 DIAGNOSIS — G9341 Metabolic encephalopathy: Secondary | ICD-10-CM | POA: Diagnosis present

## 2023-07-30 DIAGNOSIS — A419 Sepsis, unspecified organism: Principal | ICD-10-CM | POA: Diagnosis present

## 2023-07-30 DIAGNOSIS — N179 Acute kidney failure, unspecified: Secondary | ICD-10-CM

## 2023-07-30 DIAGNOSIS — Z85528 Personal history of other malignant neoplasm of kidney: Secondary | ICD-10-CM

## 2023-07-30 DIAGNOSIS — G8929 Other chronic pain: Secondary | ICD-10-CM | POA: Diagnosis present

## 2023-07-30 DIAGNOSIS — F0390 Unspecified dementia without behavioral disturbance: Secondary | ICD-10-CM | POA: Diagnosis present

## 2023-07-30 DIAGNOSIS — Z882 Allergy status to sulfonamides status: Secondary | ICD-10-CM

## 2023-07-30 DIAGNOSIS — Z85828 Personal history of other malignant neoplasm of skin: Secondary | ICD-10-CM

## 2023-07-30 DIAGNOSIS — Z8673 Personal history of transient ischemic attack (TIA), and cerebral infarction without residual deficits: Secondary | ICD-10-CM

## 2023-07-30 DIAGNOSIS — Z888 Allergy status to other drugs, medicaments and biological substances status: Secondary | ICD-10-CM

## 2023-07-30 DIAGNOSIS — L89156 Pressure-induced deep tissue damage of sacral region: Secondary | ICD-10-CM | POA: Diagnosis present

## 2023-07-30 DIAGNOSIS — Z66 Do not resuscitate: Secondary | ICD-10-CM | POA: Diagnosis present

## 2023-07-30 DIAGNOSIS — Z751 Person awaiting admission to adequate facility elsewhere: Secondary | ICD-10-CM

## 2023-07-30 DIAGNOSIS — Z7952 Long term (current) use of systemic steroids: Secondary | ICD-10-CM

## 2023-07-30 LAB — COMPREHENSIVE METABOLIC PANEL
ALT: 28 U/L (ref 0–44)
AST: 30 U/L (ref 15–41)
Albumin: 3.5 g/dL (ref 3.5–5.0)
Alkaline Phosphatase: 97 U/L (ref 38–126)
Anion gap: 11 (ref 5–15)
BUN: 34 mg/dL — ABNORMAL HIGH (ref 8–23)
CO2: 26 mmol/L (ref 22–32)
Calcium: 9.4 mg/dL (ref 8.9–10.3)
Chloride: 98 mmol/L (ref 98–111)
Creatinine, Ser: 1.82 mg/dL — ABNORMAL HIGH (ref 0.61–1.24)
GFR, Estimated: 34 mL/min — ABNORMAL LOW (ref >60)
Glucose, Bld: 89 mg/dL (ref 70–99)
Potassium: 3.4 mmol/L — ABNORMAL LOW (ref 3.5–5.1)
Sodium: 135 mmol/L (ref 135–145)
Total Bilirubin: 0.7 mg/dL (ref 0.3–1.2)
Total Protein: 8.4 g/dL — ABNORMAL HIGH (ref 6.5–8.1)

## 2023-07-30 LAB — URINALYSIS, W/ REFLEX TO CULTURE (INFECTION SUSPECTED)
Bilirubin Urine: NEGATIVE
Glucose, UA: NEGATIVE mg/dL
Hgb urine dipstick: NEGATIVE
Ketones, ur: NEGATIVE mg/dL
Nitrite: NEGATIVE
Protein, ur: 100 mg/dL — AB
Specific Gravity, Urine: 1.017 (ref 1.005–1.030)
pH: 8 (ref 5.0–8.0)

## 2023-07-30 LAB — TSH: TSH: 3.068 u[IU]/mL (ref 0.350–4.500)

## 2023-07-30 LAB — CULTURE, BLOOD (ROUTINE X 2)
Culture: NO GROWTH
Culture: NO GROWTH

## 2023-07-30 LAB — CBC WITH DIFFERENTIAL/PLATELET
Abs Immature Granulocytes: 0.06 10*3/uL (ref 0.00–0.07)
Basophils Absolute: 0 10*3/uL (ref 0.0–0.1)
Basophils Relative: 0 %
Eosinophils Absolute: 0 10*3/uL (ref 0.0–0.5)
Eosinophils Relative: 0 %
HCT: 35.6 % — ABNORMAL LOW (ref 39.0–52.0)
Hemoglobin: 11.4 g/dL — ABNORMAL LOW (ref 13.0–17.0)
Immature Granulocytes: 1 %
Lymphocytes Relative: 2 %
Lymphs Abs: 0.2 10*3/uL — ABNORMAL LOW (ref 0.7–4.0)
MCH: 30 pg (ref 26.0–34.0)
MCHC: 32 g/dL (ref 30.0–36.0)
MCV: 93.7 fL (ref 80.0–100.0)
Monocytes Absolute: 0.1 10*3/uL (ref 0.1–1.0)
Monocytes Relative: 1 %
Neutro Abs: 9.2 10*3/uL — ABNORMAL HIGH (ref 1.7–7.7)
Neutrophils Relative %: 96 %
Platelets: 116 10*3/uL — ABNORMAL LOW (ref 150–400)
RBC: 3.8 MIL/uL — ABNORMAL LOW (ref 4.22–5.81)
RDW: 15.9 % — ABNORMAL HIGH (ref 11.5–15.5)
WBC: 9.6 10*3/uL (ref 4.0–10.5)
nRBC: 0 % (ref 0.0–0.2)

## 2023-07-30 LAB — AMMONIA: Ammonia: 12 umol/L (ref 9–35)

## 2023-07-30 LAB — RESP PANEL BY RT-PCR (RSV, FLU A&B, COVID)  RVPGX2
Influenza A by PCR: NEGATIVE
Influenza B by PCR: NEGATIVE
Resp Syncytial Virus by PCR: NEGATIVE
SARS Coronavirus 2 by RT PCR: NEGATIVE

## 2023-07-30 LAB — I-STAT CG4 LACTIC ACID, ED
Lactic Acid, Venous: 2.4 mmol/L (ref 0.5–1.9)
Lactic Acid, Venous: 2.3 mmol/L (ref 0.5–1.9)
Lactic Acid, Venous: 1.7 mmol/L (ref 0.5–1.9)

## 2023-07-30 LAB — PROTIME-INR
INR: 1.2 (ref 0.8–1.2)
Prothrombin Time: 15.3 seconds — ABNORMAL HIGH (ref 11.4–15.2)

## 2023-07-30 LAB — APTT: aPTT: 27 seconds (ref 24–36)

## 2023-07-30 MED ORDER — METRONIDAZOLE 500 MG/100ML IV SOLN: 500.0000 mg | Freq: Once | INTRAVENOUS | Status: DC

## 2023-07-30 MED ORDER — SODIUM CHLORIDE 0.9 % IV SOLN: 1.0000 g | INTRAVENOUS | Status: DC

## 2023-07-30 MED ORDER — ACETAMINOPHEN 325 MG PO TABS
650.0000 mg | ORAL_TABLET | Freq: Four times a day (QID) | ORAL | Status: DC | PRN
  Administered 2023-07-30 – 2023-08-12 (×13): 650 mg via ORAL
  Filled 2023-07-30 (×15): qty 2

## 2023-07-30 MED ORDER — TRAMADOL HCL 50 MG PO TABS: 50.0000 mg | ORAL_TABLET | Freq: Two times a day (BID) | ORAL | Status: DC | PRN

## 2023-07-30 MED ORDER — POLYETHYLENE GLYCOL 3350 17 G PO PACK: 17.0000 g | PACK | Freq: Every day | ORAL | Status: DC | PRN

## 2023-07-30 MED ORDER — SODIUM CHLORIDE 0.9 % IV BOLUS
500.0000 mL | Freq: Once | INTRAVENOUS | Status: AC
  Administered 2023-07-30: 500 mL via INTRAVENOUS

## 2023-07-30 MED ORDER — LEVOTHYROXINE SODIUM 25 MCG PO TABS
25.0000 ug | ORAL_TABLET | Freq: Every day | ORAL | Status: DC
  Administered 2023-07-30 – 2023-08-12 (×13): 25 ug via ORAL
  Filled 2023-07-30 (×13): qty 1

## 2023-07-30 MED ORDER — ONDANSETRON HCL 4 MG PO TABS: 4.0000 mg | ORAL_TABLET | Freq: Four times a day (QID) | ORAL | Status: DC | PRN

## 2023-07-30 MED ORDER — NOREPINEPHRINE 4 MG/250ML-% IV SOLN
2.0000 ug/min | INTRAVENOUS | Status: DC
  Administered 2023-07-30: 2 ug/min via INTRAVENOUS
  Filled 2023-07-30: qty 250

## 2023-07-30 MED ORDER — PREGABALIN 50 MG PO CAPS
50.0000 mg | ORAL_CAPSULE | Freq: Every day | ORAL | Status: DC
  Administered 2023-07-31 – 2023-08-12 (×13): 50 mg via ORAL
  Filled 2023-07-30: qty 1
  Filled 2023-07-30: qty 2
  Filled 2023-07-30: qty 1
  Filled 2023-07-30: qty 2
  Filled 2023-07-30 (×4): qty 1
  Filled 2023-07-30: qty 2
  Filled 2023-07-30 (×5): qty 1

## 2023-07-30 MED ORDER — LORATADINE 10 MG PO TABS
10.0000 mg | ORAL_TABLET | Freq: Every day | ORAL | Status: DC
  Administered 2023-07-30 – 2023-08-12 (×13): 10 mg via ORAL
  Filled 2023-07-30 (×13): qty 1

## 2023-07-30 MED ORDER — LACTATED RINGERS IV SOLN: INTRAVENOUS | Status: AC

## 2023-07-30 MED ORDER — SODIUM CHLORIDE 0.9 % IV SOLN
500.0000 mg | Freq: Two times a day (BID) | INTRAVENOUS | Status: DC
  Administered 2023-07-30 – 2023-08-01 (×5): 500 mg via INTRAVENOUS
  Filled 2023-07-30 (×6): qty 10

## 2023-07-30 MED ORDER — ALBUTEROL SULFATE (2.5 MG/3ML) 0.083% IN NEBU: 2.5000 mg | INHALATION_SOLUTION | RESPIRATORY_TRACT | Status: DC | PRN

## 2023-07-30 MED ORDER — MIDODRINE HCL 5 MG PO TABS
5.0000 mg | ORAL_TABLET | Freq: Three times a day (TID) | ORAL | Status: DC
  Administered 2023-07-30 – 2023-07-31 (×3): 5 mg via ORAL
  Filled 2023-07-30 (×4): qty 1

## 2023-07-30 MED ORDER — VANCOMYCIN HCL IN DEXTROSE 1-5 GM/200ML-% IV SOLN: 1000.0000 mg | Freq: Once | INTRAVENOUS | Status: DC

## 2023-07-30 MED ORDER — SODIUM CHLORIDE 0.9 % IV SOLN
2.0000 g | Freq: Once | INTRAVENOUS | Status: AC
  Administered 2023-07-30: 2 g via INTRAVENOUS
  Filled 2023-07-30: qty 12.5

## 2023-07-30 MED ORDER — VANCOMYCIN HCL 1750 MG/350ML IV SOLN
1750.0000 mg | Freq: Once | INTRAVENOUS | Status: AC
  Administered 2023-07-30: 1750 mg via INTRAVENOUS
  Filled 2023-07-30: qty 350

## 2023-07-30 MED ORDER — DULOXETINE HCL 60 MG PO CPEP
60.0000 mg | ORAL_CAPSULE | Freq: Every day | ORAL | Status: DC
  Administered 2023-07-30 – 2023-08-12 (×13): 60 mg via ORAL
  Filled 2023-07-30: qty 1
  Filled 2023-07-30: qty 2
  Filled 2023-07-30 (×5): qty 1
  Filled 2023-07-30 (×2): qty 2
  Filled 2023-07-30 (×4): qty 1

## 2023-07-30 MED ORDER — SODIUM CHLORIDE 0.9 % IV SOLN
250.0000 mL | INTRAVENOUS | Status: DC
  Administered 2023-07-30: 250 mL via INTRAVENOUS

## 2023-07-30 MED ORDER — CHLORHEXIDINE GLUCONATE CLOTH 2 % EX PADS
6.0000 | MEDICATED_PAD | Freq: Every day | CUTANEOUS | Status: DC
  Administered 2023-07-30 – 2023-08-12 (×14): 6 via TOPICAL

## 2023-07-30 MED ORDER — METRONIDAZOLE 500 MG/100ML IV SOLN
500.0000 mg | Freq: Once | INTRAVENOUS | Status: AC
  Administered 2023-07-30: 500 mg via INTRAVENOUS
  Filled 2023-07-30: qty 100

## 2023-07-30 MED ORDER — ORAL CARE MOUTH RINSE: 15.0000 mL | OROMUCOSAL | Status: DC | PRN

## 2023-07-30 MED ORDER — ACETAMINOPHEN 500 MG PO TABS
1000.0000 mg | ORAL_TABLET | Freq: Once | ORAL | Status: AC
  Administered 2023-07-30: 1000 mg via ORAL
  Filled 2023-07-30: qty 2

## 2023-07-30 MED ORDER — ACETAMINOPHEN 650 MG RE SUPP
650.0000 mg | Freq: Four times a day (QID) | RECTAL | Status: DC | PRN
  Filled 2023-07-30: qty 1

## 2023-07-30 MED ORDER — PREDNISONE 5 MG PO TABS
15.0000 mg | ORAL_TABLET | Freq: Every day | ORAL | Status: DC
  Administered 2023-07-30: 15 mg via ORAL
  Filled 2023-07-30: qty 3

## 2023-07-30 MED ORDER — SODIUM CHLORIDE 0.9 % IV SOLN
500.0000 mg | INTRAVENOUS | Status: DC
  Administered 2023-07-30 – 2023-08-01 (×3): 500 mg via INTRAVENOUS
  Filled 2023-07-30 (×3): qty 5

## 2023-07-30 MED ORDER — APIXABAN 2.5 MG PO TABS: 2.5000 mg | ORAL_TABLET | Freq: Two times a day (BID) | ORAL | Status: DC

## 2023-07-30 MED ORDER — SODIUM CHLORIDE 0.9 % IV SOLN: 2.0000 g | Freq: Once | INTRAVENOUS | Status: DC

## 2023-07-30 MED ORDER — ONDANSETRON HCL 4 MG/2ML IJ SOLN: 4.0000 mg | Freq: Four times a day (QID) | INTRAMUSCULAR | Status: DC | PRN

## 2023-07-30 MED ORDER — APIXABAN 2.5 MG PO TABS
2.5000 mg | ORAL_TABLET | Freq: Two times a day (BID) | ORAL | Status: DC
  Administered 2023-07-30: 2.5 mg via ORAL
  Filled 2023-07-30: qty 1

## 2023-07-30 MED ORDER — HYDROCORTISONE SOD SUC (PF) 100 MG IJ SOLR
100.0000 mg | Freq: Two times a day (BID) | INTRAMUSCULAR | Status: DC
  Administered 2023-07-30 – 2023-08-01 (×6): 100 mg via INTRAVENOUS
  Filled 2023-07-30 (×6): qty 2

## 2023-07-30 MED ORDER — DOCUSATE SODIUM 100 MG PO CAPS: 100.0000 mg | ORAL_CAPSULE | Freq: Two times a day (BID) | ORAL | Status: DC | PRN

## 2023-07-30 MED ORDER — SENNOSIDES-DOCUSATE SODIUM 8.6-50 MG PO TABS: 1.0000 | ORAL_TABLET | Freq: Every evening | ORAL | Status: DC | PRN

## 2023-07-30 NOTE — ED Notes (Signed)
Antibiotic and IV fluid administration delayed at this time, d/t no IV pumps available in ER. Secretary requested to call for them at this time.

## 2023-07-30 NOTE — Consult Note (Signed)
WOC Nurse Consult Note: Reason for Consult: pressure injury vs. MASD Wound type: Deep Tissue Pressure Injury; right trochanter Deep Tissue Pressure Injury; sacrum Pressure Injury POA: Yes Measurement: Right trochanter; 1cm x 1cm x0cm Sacrum; 8cm x 6cm x 0cm  Wound ZJI:RCVE purple, non blanchable tissue, trochanter has early evidence of evolution but not eschar at this time Drainage (amount, consistency, odor) none Periwound: intact  Dressing procedure/placement/frequency: Continue silicone foam per nursing skin care order set.  Low air loss mattress in place while in the ICU for moisture management and pressure redistribution.    Re consult if needed, will not follow at this time. Thanks  Pavan Bring [employer], RN,CWOCN, CNS, CWON-AP [phone])

## 2023-07-30 NOTE — ED Notes (Signed)
Md at bedside, is aware of patient current BP

## 2023-07-30 NOTE — Plan of Care (Addendum)
Discussed with Dr. Everardo All PCCM.  Gave total 1500cc fluid bolus per her recommendation but despite that and midodrine BP is 81/43 he is mentating but endorses some lightheadedness.  Will give another 500cc NS bolus, recheck Lactate, and Dr. Everardo All will consult.

## 2023-07-30 NOTE — Progress Notes (Signed)
PCCM Progress Note   Nurse contacted team that family communicates that they would not want to pursue central line and if pressor requirement worsens then would likely elect comfort measures. Family wishes to be contacted if that is the route.

## 2023-07-30 NOTE — ED Notes (Signed)
ICU MD at bedside

## 2023-07-30 NOTE — ED Notes (Signed)
Md at bedside

## 2023-07-30 NOTE — ED Notes (Signed)
Spoke with Md at bedside, will give pt Midodrine for BP and 1/2 L of IV fluids as per order

## 2023-07-30 NOTE — Plan of Care (Signed)

## 2023-07-30 NOTE — Progress Notes (Addendum)
NAME:  Douglas Walker, MRN:  [id number], DOB:  07/09/1925, LOS: 0 ADMISSION DATE:  07/30/2023, CONSULTATION DATE:  07/30/23 REFERRING MD: Teena Dunk, MD CHIEF COMPLAINT:  Hypotension   History of Present Illness:  89 year old with PMHx noted below admitted for sepsis secondary to CAP +/- recurrent UTI. Unable to obtain history due to dementia. No family at bedside. History obtained from chart review.   He was reported in usual state of health until fever 101 associated with confusion, weakness and shaking. Reported productive cough but unclear if this is baseline. EMS found patient with SpO2 80% on room air and started on nasal cannula. In the ED he was febrile to 103 and 89% on 3L. Blood pressure was initially 126/60. He received 500 cc IVF, vanc, cefepime and flagyl and admitted by [facility]  This morning he was hypotensive with SBP 80s. PCCM was consulted. Despite additional 1.5L he continued to be hypotensive with SBP in 80s. Will admit/transfer to ICU for peripheral pressors. Primary team communicated DNR/DNI was confirmed by family but ok with vasopressor support.   Pertinent  Medical History  Dementia, chronic diastolic congestive heart failure, GERD, essential tremor, hyperlipidemia, paroxysmal atrial fibrillation on Eliquis, pulmonary hypertension   Significant Hospital Events: Including procedures, antibiotic start and stop dates in addition to other pertinent events     Interim History / Subjective:  As above  Objective   Blood pressure (!) 81/43, pulse 70, temperature 99.8 F (37.7 C), temperature source Oral, resp. rate 20, SpO2 95%.        Intake/Output Summary (Last 24 hours) at 07/30/2023 1009 Last data filed at 07/30/2023 0932 Gross per 24 hour  Intake 1385.21 ml  Output --  Net 1385.21 ml   There were no vitals filed for this visit.  Physical Exam: General: Chronically ill-appearing, no acute distress HENT: [place], AT, OP clear, MMM Eyes: EOMI, no scleral  icterus Respiratory: Clear to auscultation bilaterally.  No crackles, wheezing or rales Cardiovascular: RRR, -M/R/G, no JVD GI: BS+, soft, nontender Extremities:-Edema,-tenderness Neuro: AAO x4, CNII-XII grossly intact Skin: Intact, no rashes or bruising Psych: Normal mood, normal affect GU: Foley in place  CXR - Diffuse interstitial airspace opacities  Resolved Hospital Problem list     Assessment & Plan:   Septic shock secondary to CAP vs recurrent UTI Hx MDR UTI on chronic macrobid Peripheral levophed ordered for MAP goal >65 S/p 1.5 L IVF. Restart LR 150 cc/hr Continue broad spectrum antibiotics. ID consulted and note pending F/u culture data Trend LA Start stress dose steroids Continue home midodrine Consider A-line  AoCKD III Monitor UOP/Cr Avoid nephrotoxic agents IVF/pressor support as above  Acute encephalopathy secondary to sepsis Dementia Reorient as able TSH, ammonia  Atrial fibrillation Tele Hold Eliquis for any pending procedures  Hypothyroidism Continue levothyroxine  Chronic pain Continue Cymbalta and Lyrica  GOC Son confirms DNR/DNI with PCCM team. Unsure if his father would want a central line. Would like to be included in decision for central line access if this is indicated in the future.  Best Practice (right click and "Reselect all SmartList Selections" daily)   Diet/type: NPO DVT prophylaxis: other GI prophylaxis: N/A Lines: N/A Foley:  N/A Code Status:  DNR Last date of multidisciplinary goals of care discussion [ 07/30/23] TRH confirmed DNR/DNI  Labs   CBC: Recent Labs  Lab 07/29/23 1445 07/30/23 0247  WBC 9.4 9.6  NEUTROABS  --  9.2*  HGB 11.0* 11.4*  HCT 33.7* 35.6*  MCV  93.6 93.7  PLT 102* 116*    Basic Metabolic Panel: Recent Labs  Lab 07/29/23 1445 07/30/23 0247  NA 140 135  K 3.8 3.4*  CL 100 98  CO2 32 26  GLUCOSE 94 89  BUN 31* 34*  CREATININE 1.76* 1.82*  CALCIUM 9.5 9.4   GFR: Estimated  Creatinine Clearance: 24.3 mL/min (A) (by C-G formula based on SCr of 1.82 mg/dL (H)). Recent Labs  Lab 07/29/23 1445 07/30/23 0247 07/30/23 0302 07/30/23 0457  WBC 9.4 9.6  --   --   LATICACIDVEN  --   --  2.4* 2.3*    Liver Function Tests: Recent Labs  Lab 07/29/23 1445 07/30/23 0247  AST 20 30  ALT 19 28  ALKPHOS  --  97  BILITOT 0.5 0.7  PROT 7.6 8.4*  ALBUMIN  --  3.5   No results for input(s): "LIPASE", "AMYLASE" in the last 168 hours. No results for input(s): "AMMONIA" in the last 168 hours.  ABG    Component Value Date/Time   HCO3 29.5 (H) 08/29/2022 1553   TCO2 31 08/29/2022 1553   O2SAT 80 08/29/2022 1553     Coagulation Profile: Recent Labs  Lab 07/30/23 0247  INR 1.2    Cardiac Enzymes: No results for input(s): "CKTOTAL", "CKMB", "CKMBINDEX", "TROPONINI" in the last 168 hours.  HbA1C: Hgb A1c MFr Bld  Date/Time Value Ref Range Status  01/29/2021 02:37 AM 5.3 4.8 - 5.6 % Final    Comment:    (NOTE) Pre diabetes:          5.7%-6.4%  Diabetes:              >6.4%  Glycemic control for   <7.0% adults with diabetes     CBG: No results for input(s): "GLUCAP" in the last 168 hours.  Review of Systems:   Unable to obtain  Past Medical History:  He,  has a past medical history of Anemia, Arthritis, Cancer (HCC), Chronic diastolic (congestive) heart failure (HCC), Chronic low back pain (12/15/2017), CKD (chronic kidney disease) stage 3, GFR 30-59 ml/min (HCC), Essential tremor (02/28/2016), GERD (gastroesophageal reflux disease), Hernia, History of hiatal hernia, Hyperlipemia, Hypertension, Macular degeneration, Neuromuscular disorder (HCC), Paroxysmal atrial fibrillation (HCC) (10/28/2013), Presence of permanent cardiac pacemaker (12/04/2018), Pulmonary hypertension (HCC), Stroke (HCC) (12/2020), Tachycardia-bradycardia syndrome (HCC) (09/13/2014), Thoracic aortic aneurysm (HCC), Thrombocytopenia (HCC), and TIA (transient ischemic attack).    Surgical History:   Past Surgical History:  Procedure Laterality Date   APPENDECTOMY     EYE SURGERY     HERNIA REPAIR     HERNIA REPAIR     INSERT / REPLACE / REMOVE PACEMAKER  12/04/2018   IRRIGATION AND DEBRIDEMENT ABSCESS Right 07/24/2020   Procedure: IRRIGATION AND DEBRIDEMENT HEMATOMA;  Surgeon: Abigail Miyamoto, MD;  Location: [facility] OR;  Service: General;  Laterality: Right;   MASS EXCISION Right 07/20/2020   Procedure: EXCISION OF RIGHT CHEST WALL MASS;  Surgeon: Almond Lint, MD;  Location: [facility] OR;  Service: General;  Laterality: Right;   PACEMAKER IMPLANT N/A 12/04/2018   Procedure: PACEMAKER IMPLANT;  Surgeon: Marinus Maw, MD;  Location: [facility] INVASIVE CV LAB;  Service: Cardiovascular;  Laterality: N/A;     Social History:   reports that he quit smoking about 60 years ago. His smoking use included cigarettes. He has never used smokeless tobacco. He reports that he does not drink alcohol and does not use drugs.   Family History:  His family history includes Blindness in his sister;  GI problems in his mother; Hearing loss in his sister; Other in his sister. There is no history of Heart attack or Stroke.   Allergies Allergies  Allergen Reactions   Sulfa Antibiotics Other (See Comments)    Weakness Dizziness  Sweats    Mysoline [Primidone] Other (See Comments)    Sedation    Zocor [Simvastatin] Other (See Comments)    Arthralgias Fatigue     Home Medications  Prior to Admission medications   Medication Sig Start Date End Date Taking? Authorizing Provider  apixaban (ELIQUIS) 2.5 MG TABS tablet Take 1 tablet (2.5 mg total) by mouth 2 (two) times daily. 09/24/22  Yes Dunn, Dayna N, PA-C  Ascorbic Acid (VITAMIN C) 1000 MG tablet Take 1,000 mg by mouth daily.   Yes [provider]  DULoxetine (CYMBALTA) 60 MG capsule Take 1 capsule (60 mg total) by mouth daily. 04/23/21  Yes Lovorn, Aundra Millet, MD  furosemide (LASIX) 40 MG tablet Take 1 tablet (40 mg total) by mouth  daily. 02/28/23  Yes Chandrasekhar, Mahesh A, MD  levothyroxine (SYNTHROID) 25 MCG tablet Take 1 tablet (25 mcg total) by mouth daily before breakfast. 02/14/21  Yes Angiulli, Mcarthur Rossetti, PA-C  loratadine (CLARITIN) 10 MG tablet Take 10 mg by mouth daily.   Yes [provider]  midodrine (PROAMATINE) 2.5 MG tablet Take 1 tablet (2.5 mg total) by mouth 3 (three) times daily with meals. Patient taking differently: Take 2.5 mg by mouth as needed (Low BP). 10/22/22  Yes Willeen Niece, MD  Multiple Vitamin (MULTIVITAMIN WITH MINERALS) TABS tablet Take 1 tablet by mouth daily.   Yes [provider]  Multiple Vitamins-Minerals (OCUVITE ADULT 50+) CAPS Take 1 capsule by mouth daily.   Yes [provider]  nitrofurantoin, macrocrystal-monohydrate, (MACROBID) 100 MG capsule Take 1 capsule by mouth at bedtime 03/31/23  Yes Vu, Gershon Mussel T, MD  polyethylene glycol (MIRALAX / GLYCOLAX) 17 g packet Take 17 g by mouth daily. Patient taking differently: Take 17 g by mouth daily as needed for mild constipation. 07/08/21  Yes Narda Bonds, MD  predniSONE (DELTASONE) 5 MG tablet Take 5 mg by mouth daily. 09/12/22  Yes [provider]  pregabalin (LYRICA) 50 MG capsule Take 50 mg by mouth at bedtime.   Yes [provider]  topiramate (TOPAMAX) 50 MG tablet Take 1 tablet (50 mg total) by mouth 2 (two) times daily. Patient taking differently: Take 50 mg by mouth daily. 02/27/22  Yes Butch Penny, NP  traMADol (ULTRAM) 50 MG tablet Take 50 mg by mouth 2 (two) times daily. 01/22/23  Yes [provider]  vitamin B-12 (CYANOCOBALAMIN) 1000 MCG tablet Take 1 tablet (1,000 mcg total) by mouth every other day. Patient taking differently: Take 1,000 mcg by mouth See admin instructions. oral daily on , Tuesday, Thursday, and Saturday, repeat. 02/14/21  Yes Angiulli, Mcarthur Rossetti, PA-C  acetaminophen (TYLENOL) 325 MG tablet Take 2 tablets (650 mg total) by mouth every 4 (four) hours  as needed for mild pain (or temp > 37.5 C (99.5 F)). Patient not taking: Reported on 07/30/2023 02/14/21   Charlton Amor, PA-C     Critical care time:  45 min    The patient is critically ill with multiple organ systems failure and requires high complexity decision making for assessment and support, frequent evaluation and titration of therapies, application of advanced monitoring technologies and extensive interpretation of multiple databases.  Independent Critical Care Time: 45 Minutes.   Mechele Collin, M.D.  [facility] 07/30/2023 10:33 AM   Please see Amion for pager number to reach on-call Pulmonary and Critical Care Team.

## 2023-07-30 NOTE — ED Triage Notes (Signed)
Pt arrives via EMS from home after family called d/t patient had a temperature of 101, was shaking and more weak than normal, and more confused than his baseline. EMS did not report orientation baseline at this time. Pt hypoxic on room air with SPO2 80%, and pt improved with 4L/min [place] to 86%. Pt has wet productive cough. Pt in NAD at this time. EMS also reports that pt chronic indwelling foley was recently changed but heavy amounts of sediment observed in tubing at this time. Per family to EMS pt "prone to UTI and becoming septic." Dr. Nicanor Alcon notified of sepsis criteria at this time.

## 2023-07-30 NOTE — Progress Notes (Signed)
Pharmacy Antibiotic Note  Douglas Walker is a 89 y.o. male admitted on 07/30/2023 with sepsis. Pharmacy has been consulted for meropenem dosing.  Patient has a history of sepsis with multidrug resistant organisms in the urine--most recent urine culture from 01/02/23 growing ESBL E Coli. Patient is on daily Macrobid outpatient for prophylaxis of recurrent UTI. On this admission, WBC 9.6, LA 2.3, Tmax 103.   Plan: Start meropenem 500mg  IV q12hrs  F/u cultures and de-escalate antibiotics as able  Monitor renal function and overall clinical picture    Temp (24hrs), Avg:100.3 F (37.9 C), Min:97.6 F (36.4 C), Max:103 F (39.4 C)  Recent Labs  Lab 07/29/23 1445 07/30/23 0247 07/30/23 0302 07/30/23 0457  WBC 9.4 9.6  --   --   CREATININE 1.76* 1.82*  --   --   LATICACIDVEN  --   --  2.4* 2.3*    Estimated Creatinine Clearance: 24.3 mL/min (A) (by C-G formula based on SCr of 1.82 mg/dL (H)).    Allergies  Allergen Reactions   Sulfa Antibiotics Other (See Comments)    Weakness Dizziness  Sweats    Mysoline [Primidone] Other (See Comments)    Sedation    Zocor [Simvastatin] Other (See Comments)    Arthralgias Fatigue    Antimicrobials this admission: 7/31 cefepime 2g x1 7/31 Flagyl 500mg  x1 7/31 vancomycin 1750mg  x1 7/31 azithromycin >> 7/31 meropenem >>  Dose adjustments this admission: N/A  Microbiology results: 7/31 BCx: in process 7/31 UCx: in process     Thank you for allowing pharmacy to be a part of this patient's care.  Cherylin Mylar, PharmD Clinical Pharmacist  7/31/20248:01 AM

## 2023-07-30 NOTE — Progress Notes (Signed)
Elink monitoring for the code sepsis protocol.  

## 2023-07-30 NOTE — Progress Notes (Signed)
A consult was received from an ED physician for Cefepime + Vancomycin per pharmacy dosing.  The patient's profile has been reviewed for ht/wt/allergies/indication/available labs.    A one time order has been placed for Cefepime 2gm + Vancomycin 1750mg  IV.    Further antibiotics/pharmacy consults should be ordered by admitting physician if indicated.                       Thank you, Junita Push PharmD 07/30/2023  2:53 AM

## 2023-07-30 NOTE — H&P (Addendum)
History and Physical  Douglas Walker GUR:[id number] DOB: 12-10-1925 DOA: 07/30/2023  PCP: Joya Martyr, MD   Chief Complaint: Fever, chills  HPI: Douglas Walker is a 89 y.o. male with medical history significant for chronic diastolic congestive heart failure, GERD, essential tremor, hyperlipidemia, paroxysmal atrial fibrillation on Eliquis, pulmonary hypertension being admitted to the hospital due to sepsis from suspected community-acquired pneumonia versus recurrent UTI.  History is provided from my review of the medical record, as well as discussion with the patient's son.  Patient is of advanced age and has dementia, unable to give me much history.  Apparently the patient was in his usual state of health, until he had a fever at home last evening of 101, some shaking, more weak than normal and more confused than his baseline.  He was reported to have a wet productive cough.  Per EMS, he was saturating 80% on room air, was placed on 4 L nasal cannula oxygen.  ED Course: On evaluation in the emergency department, found to be febrile temperature 103, blood pressure initially 126/60, saturating 89% on 3 L.  Oxygen saturation increased to 93% on 4 L nasal cannula oxygen, he received 500 cc fluid bolus, but did not receive any further due to his history of diastolic heart failure.  Patient has not defervesced, blood pressure is low about 94/44.  He remains slightly tachypneic aspiratory rate 22.  Blood cultures were obtained, he received empiric IV cefepime, Flagyl, vancomycin as sepsis antibiotics.  Orders were placed for inpatient admission to stepdown unit.  Only the patient is resting comfortably, in no distress.  Review of Systems: Please see HPI for pertinent positives and negatives. A complete 10 system review of systems are otherwise negative.  Past Medical History:  Diagnosis Date   Anemia    Arthritis    shoulders and back   Cancer (HCC)    skin cancers   Chronic diastolic  (congestive) heart failure (HCC)    Chronic low back pain 12/15/2017   CKD (chronic kidney disease) stage 3, GFR 30-59 ml/min (HCC)    Essential tremor 02/28/2016   GERD (gastroesophageal reflux disease)    Hernia    History of hiatal hernia    Hyperlipemia    Hypertension    Macular degeneration    Neuromuscular disorder (HCC)    neuropathy   Paroxysmal atrial fibrillation (HCC) 10/28/2013   Presence of permanent cardiac pacemaker 12/04/2018   Pulmonary hypertension (HCC)    Stroke (HCC) 12/2020   Tachycardia-bradycardia syndrome (HCC) 09/13/2014   Thoracic aortic aneurysm (HCC)    Thrombocytopenia (HCC)    TIA (transient ischemic attack)    Past Surgical History:  Procedure Laterality Date   APPENDECTOMY     EYE SURGERY     HERNIA REPAIR     HERNIA REPAIR     INSERT / REPLACE / REMOVE PACEMAKER  12/04/2018   IRRIGATION AND DEBRIDEMENT ABSCESS Right 07/24/2020   Procedure: IRRIGATION AND DEBRIDEMENT HEMATOMA;  Surgeon: Abigail Miyamoto, MD;  Location: [facility] OR;  Service: General;  Laterality: Right;   MASS EXCISION Right 07/20/2020   Procedure: EXCISION OF RIGHT CHEST WALL MASS;  Surgeon: Almond Lint, MD;  Location: [facility] OR;  Service: General;  Laterality: Right;   PACEMAKER IMPLANT N/A 12/04/2018   Procedure: PACEMAKER IMPLANT;  Surgeon: Marinus Maw, MD;  Location: [facility] INVASIVE CV LAB;  Service: Cardiovascular;  Laterality: N/A;    Social History:  reports that he quit smoking about 60 years ago. His  smoking use included cigarettes. He has never used smokeless tobacco. He reports that he does not drink alcohol and does not use drugs.   Allergies  Allergen Reactions   Sulfa Antibiotics Other (See Comments)    Weakness Dizziness  Sweats    Mysoline [Primidone] Other (See Comments)    Sedation    Zocor [Simvastatin] Other (See Comments)    Arthralgias Fatigue    Family History  Problem Relation Age of Onset   GI problems Mother    Other Sister        PAIN ISSUES    Hearing loss Sister    Blindness Sister    Heart attack Neg Hx    Stroke Neg Hx      Prior to Admission medications   Medication Sig Start Date End Date Taking? Authorizing Provider  apixaban (ELIQUIS) 2.5 MG TABS tablet Take 1 tablet (2.5 mg total) by mouth 2 (two) times daily. 09/24/22  Yes Dunn, Dayna N, PA-C  Ascorbic Acid (VITAMIN C) 1000 MG tablet Take 1,000 mg by mouth daily.   Yes [provider]  DULoxetine (CYMBALTA) 60 MG capsule Take 1 capsule (60 mg total) by mouth daily. 04/23/21  Yes Lovorn, Aundra Millet, MD  furosemide (LASIX) 40 MG tablet Take 1 tablet (40 mg total) by mouth daily. 02/28/23  Yes Chandrasekhar, Mahesh A, MD  levothyroxine (SYNTHROID) 25 MCG tablet Take 1 tablet (25 mcg total) by mouth daily before breakfast. 02/14/21  Yes Angiulli, Mcarthur Rossetti, PA-C  loratadine (CLARITIN) 10 MG tablet Take 10 mg by mouth daily.   Yes [provider]  midodrine (PROAMATINE) 2.5 MG tablet Take 1 tablet (2.5 mg total) by mouth 3 (three) times daily with meals. Patient taking differently: Take 2.5 mg by mouth as needed (Low BP). 10/22/22  Yes Willeen Niece, MD  Multiple Vitamin (MULTIVITAMIN WITH MINERALS) TABS tablet Take 1 tablet by mouth daily.   Yes [provider]  Multiple Vitamins-Minerals (OCUVITE ADULT 50+) CAPS Take 1 capsule by mouth daily.   Yes [provider]  nitrofurantoin, macrocrystal-monohydrate, (MACROBID) 100 MG capsule Take 1 capsule by mouth at bedtime 03/31/23  Yes Vu, Gershon Mussel T, MD  polyethylene glycol (MIRALAX / GLYCOLAX) 17 g packet Take 17 g by mouth daily. Patient taking differently: Take 17 g by mouth daily as needed for mild constipation. 07/08/21  Yes Narda Bonds, MD  predniSONE (DELTASONE) 5 MG tablet Take 5 mg by mouth daily. 09/12/22  Yes [provider]  pregabalin (LYRICA) 50 MG capsule Take 50 mg by mouth at bedtime.   Yes [provider]  topiramate (TOPAMAX) 50 MG tablet Take 1 tablet (50 mg total) by  mouth 2 (two) times daily. Patient taking differently: Take 50 mg by mouth daily. 02/27/22  Yes Butch Penny, NP  traMADol (ULTRAM) 50 MG tablet Take 50 mg by mouth 2 (two) times daily. 01/22/23  Yes [provider]  vitamin B-12 (CYANOCOBALAMIN) 1000 MCG tablet Take 1 tablet (1,000 mcg total) by mouth every other day. Patient taking differently: Take 1,000 mcg by mouth See admin instructions. oral daily on , Tuesday, Thursday, and Saturday, repeat. 02/14/21  Yes Angiulli, Mcarthur Rossetti, PA-C  acetaminophen (TYLENOL) 325 MG tablet Take 2 tablets (650 mg total) by mouth every 4 (four) hours as needed for mild pain (or temp > 37.5 C (99.5 F)). Patient not taking: Reported on 07/30/2023 02/14/21   Charlton Amor, PA-C    Physical Exam: BP (!) 94/44   Pulse 70  Temp 99.8 F (37.7 C) (Oral)   Resp (!) 22   SpO2 95%   General: Elderly gentleman sleeping in the ER, appears his stated age.  Easily arousable, pleasant and cooperative, but oriented only to self. Eyes: EOMI, clear conjuctivae, white sclerea Neck: supple, no masses, trachea mildline  Cardiovascular: RRR, no murmurs or rubs, no peripheral edema  Respiratory: clear to auscultation bilaterally anterior exam, no wheezes, no crackles  Abdomen: soft, nontender, nondistended, normal bowel tones heard  Skin: dry, no rashes  Musculoskeletal: no joint effusions, normal range of motion  Psychiatric: appropriate affect, normal clear speech  Neurologic: extraocular muscles intact, clear speech, moving all extremities with intact sensorium          Labs on Admission:  Basic Metabolic Panel: Recent Labs  Lab 07/29/23 1445 07/30/23 0247  NA 140 135  K 3.8 3.4*  CL 100 98  CO2 32 26  GLUCOSE 94 89  BUN 31* 34*  CREATININE 1.76* 1.82*  CALCIUM 9.5 9.4   Liver Function Tests: Recent Labs  Lab 07/29/23 1445 07/30/23 0247  AST 20 30  ALT 19 28  ALKPHOS  --  97  BILITOT 0.5 0.7  PROT 7.6 8.4*  ALBUMIN  --  3.5    No results for input(s): "LIPASE", "AMYLASE" in the last 168 hours. No results for input(s): "AMMONIA" in the last 168 hours. CBC: Recent Labs  Lab 07/29/23 1445 07/30/23 0247  WBC 9.4 9.6  NEUTROABS  --  9.2*  HGB 11.0* 11.4*  HCT 33.7* 35.6*  MCV 93.6 93.7  PLT 102* 116*   Cardiac Enzymes: No results for input(s): "CKTOTAL", "CKMB", "CKMBINDEX", "TROPONINI" in the last 168 hours.  BNP (last 3 results) Recent Labs    08/29/22 1524  BNP 357.1*    ProBNP (last 3 results) Recent Labs    02/04/23 1619  PROBNP 1,402*    CBG: No results for input(s): "GLUCAP" in the last 168 hours.  Radiological Exams on Admission: DG Chest Port 1 View  Result Date: 07/30/2023 CLINICAL DATA:  Questionable sepsis - evaluate for abnormality Confusion,shaking, ? Sepsis, hypoxia EXAM: PORTABLE CHEST 1 VIEW.  Patient rotated. COMPARISON:  Chest x-ray 02/26/2023 FINDINGS: Left chest wall 2 lead pacemaker. Limited evaluation of the heart and mediastinal contours due to patient rotation. Aortic calcification. Interval development of diffuse interstitial and airspace opacities. No pleural effusion. No pneumothorax. No acute osseous abnormality. IMPRESSION: 1. Limited evaluation of the heart and mediastinal contours due to patient rotation. Recommend repeat PA and lateral view of the chest for further evaluation. 2. Interval development of diffuse interstitial and airspace opacities. Finding may represent a combination of infection and/or inflammation. 3.  Aortic Atherosclerosis (ICD10-I70.0). Electronically Signed   By: Tish Frederickson M.D.   On: 07/30/2023 03:25    Assessment/Plan This is a 89 year old gentleman with a history of paroxysmal atrial fibrillation on Eliquis, heart failure with preserved EF, hypertension, hyperlipidemia, pulmonary hypertension, chronic Foley catheter with recurrent MDRO UTI being admitted to the hospital with sepsis likely due to community-acquired pneumonia versus  recurrent UTI.  Sepsis-meeting criteria with tachypnea, fever, initially did not receive fluids due to history of heart failure and was normotensive, however now he has significant hypotension, lactate 2.4.  Source is suspected community-acquired pneumonia versus recurrent UTI. -Inpatient admission to stepdown unit -he received only 500 cc fluid in the ER, given his history of heart failure -Was given empiric IV vancomycin, IV cefepime, IV Flagyl in the ER -Urine culture and blood cultures  were obtained in the ER -Goals of care discussed extensively with his son Douglas Walker this morning over the phone, confirms DNR but states would be okay with ICU admission and pressors if necessary -Given continued hypotension, will bolus another 500 cc now -Will request critical care consultation  Hypotension-presumably due to sepsis, resume the patient's home midodrine at increased dose of 5 mg p.o. 3 times daily, and bolus as above.  He also takes 5 mg prednisone chronically, will increase this to 15 mg p.o. daily for the next 3 days.  Community-acquired pneumonia-suspected due to acute hypoxic respiratory failure, and chest x-ray findings as above -Will treat empirically with IV azithromycin, along with IV meropenem for UTI -Continue supplemental oxygen as needed  History of MDRO UTI-on suppressive Macrobid therapy -Start empiric IV meropenem -ID consult requested, discussed with Dr. Cassie Freer  Hypothyroidism-continue home Synthroid  Chronic pain-continue Cymbalta, Lyrica at bedtime and as needed tramadol  Acute renal failure-presumably due to his sepsis and hypotension -Cautious IV fluids as above, avoid nephrotoxins -Follow renal function with daily labs  Paroxysmal atrial fibrillation-currently in sinus rhythm, continue home Eliquis  DVT prophylaxis: Eliquis 2.5 mg p.o. twice daily    Code Status: DNR-discussed in detail with patient's son Douglas Walker, confirmed DNR but would be okay with ICU  admission for pressors.  Consults called: Pulmonary critical care  Admission status: The appropriate patient status for this patient is INPATIENT. Inpatient status is judged to be reasonable and necessary in order to provide the required intensity of service to ensure the patient's safety. The patient's presenting symptoms, physical exam findings, and initial radiographic and laboratory data in the context of their chronic comorbidities is felt to place them at high risk for further clinical deterioration. Furthermore, it is not anticipated that the patient will be medically stable for discharge from the hospital within 2 midnights of admission.    I certify that at the point of admission it is my clinical judgment that the patient will require inpatient hospital care spanning beyond 2 midnights from the point of admission due to high intensity of service, high risk for further deterioration and high frequency of surveillance required  Time spent: 65 minutes  Elienai Gailey Sharlette Dense MD [facility] Pager [phone]  If 7PM-7AM, please contact night-coverage [email or web address] Password [facility]  07/30/2023, 7:42 AM

## 2023-07-30 NOTE — Consult Note (Signed)
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  UA showed negative nitrites and large leukocytes - Currently on meropenem and azithromycin.  Patient is unable to provide comprehensive history.  I reviewed [name] note from 7/30, he was appeared to be stable at that point.  As such I suspect his symptoms are secondary to pneumonia.  Recommendations:  -Continue meropenem, azithromycin to cover any atypicals - UDS positive for barbiturates on 01/28/2021 - Follow urine and blood cultures - Obtain strep and Legionella antigen - Sputum cultures - Foley exchange, last time Foley was exchanged appears to be on 7/4. Microbiology:   Antibiotics: Vancomycin, cefepime, metronidazole 7/30- Azithromycn , merrem 7/31-  Cultures: Blood 7/31 2/2 NG Urine 7/31 pending Other   HPI: Douglas Walker is a 89 y.o. male with history of chronic diastolic congestive heart failure, GERD, tremor, hyperlipidemia, PAF on Eliquis, pulmonary hypertension,Dementia, recurrent DRO UTI followed by infectious disease Dr. Renold Don last seen on 07/29/2023 in the setting of chronic indwelling Foley fluids in the setting of BPH, history of chronic prostatitis CKD stage III history of low-grade transitional cell carcinoma bladder 2005 no recurrence on subsequent cystoscopy advised patient to test of immediate history  suggestive of UTI only.  Last Foley exchange on 07/03/2023 visit for abdominal discomfort, discomfort improved previously with catheter exchange.  He is on Macrobid for chronic suppression.  To the ED as he had a temp of 101 evening prior to admission, on arrival temp of 103, 9.6K.  Chest x-ray showed interval development of interstitial and airspace opacity suspicious for inflammation versus infection.  UA showed negative nitrites and large leukocytes patient started on vancomycin Vanco metronidazole.  Admitted for sepsis secondary to UTI versus pneumonia.   Review of Systems: Review of Systems  All other systems reviewed and are negative.   Past Medical History:  Diagnosis Date   Anemia    Arthritis    shoulders and back   Cancer (HCC)    skin cancers   Chronic diastolic (congestive) heart failure (HCC)    Chronic low back pain 12/15/2017   CKD (chronic kidney disease) stage 3, GFR 30-59 ml/min (HCC)    Essential tremor 02/28/2016   GERD (gastroesophageal reflux disease)    Hernia    History of hiatal hernia    Hyperlipemia    Hypertension    Macular degeneration    Neuromuscular disorder (HCC)    neuropathy   Paroxysmal atrial fibrillation (HCC) 10/28/2013   Presence of permanent cardiac pacemaker 12/04/2018   Pulmonary hypertension (HCC)    Stroke (HCC) 12/2020   Tachycardia-bradycardia syndrome (HCC) 09/13/2014   Thoracic aortic aneurysm (HCC)    Thrombocytopenia (HCC)    TIA (transient ischemic attack)     Social History   Tobacco Use   Smoking status: Former    Current packs/day: 0.00    Types: Cigarettes    Quit date: 12/30/1962  Years since quitting: 60.6   Smokeless tobacco: Never  Vaping Use   Vaping status: Never Used  Substance Use Topics   Alcohol use: No   Drug use: No    Family History  Problem Relation Age of Onset   GI problems Mother    Other Sister        PAIN ISSUES   Hearing loss Sister    Blindness Sister    Heart attack Neg Hx     Stroke Neg Hx    Scheduled Meds:  apixaban  2.5 mg Oral BID   DULoxetine  60 mg Oral Daily   levothyroxine  25 mcg Oral QAC breakfast   loratadine  10 mg Oral Daily   midodrine  5 mg Oral TID WC   predniSONE  15 mg Oral Daily   pregabalin  50 mg Oral QHS   Continuous Infusions:  azithromycin Stopped (07/30/23 0900)   lactated ringers 150 mL/hr at 07/30/23 0805   meropenem (MERREM) IV 500 mg (07/30/23 1000)   metronidazole     PRN Meds:.acetaminophen **OR** acetaminophen, albuterol, ondansetron **OR** ondansetron (ZOFRAN) IV, senna-docusate, traMADol Allergies  Allergen Reactions   Sulfa Antibiotics Other (See Comments)    Weakness Dizziness  Sweats    Mysoline [Primidone] Other (See Comments)    Sedation    Zocor [Simvastatin] Other (See Comments)    Arthralgias Fatigue    OBJECTIVE: Blood pressure (!) 81/43, pulse 70, temperature 99 F (37.2 C), temperature source Oral, resp. rate 20, SpO2 95%.  Physical Exam Constitutional:      General: He is not in acute distress.    Appearance: He is normal weight. He is not toxic-appearing.  HENT:     Head: Normocephalic and atraumatic.     Right Ear: External ear normal.     Left Ear: External ear normal.     Nose: No congestion or rhinorrhea.     Mouth/Throat:     Mouth: Mucous membranes are moist.     Pharynx: Oropharynx is clear.  Eyes:     Extraocular Movements: Extraocular movements intact.     Conjunctiva/sclera: Conjunctivae normal.     Pupils: Pupils are equal, round, and reactive to light.  Cardiovascular:     Rate and Rhythm: Normal rate and regular rhythm.     Heart sounds: No murmur heard.    No friction rub. No gallop.  Pulmonary:     Effort: Pulmonary effort is normal.     Breath sounds: Normal breath sounds.  Abdominal:     General: Abdomen is flat. Bowel sounds are normal.     Palpations: Abdomen is soft.  Musculoskeletal:        General: No swelling.     Cervical back: Normal range of motion and  neck supple.  Skin:    General: Skin is warm and dry.  Neurological:     General: No focal deficit present.  Psychiatric:        Mood and Affect: Mood normal.     Lab Results Lab Results  Component Value Date   WBC 9.6 07/30/2023   HGB 11.4 (L) 07/30/2023   HCT 35.6 (L) 07/30/2023   MCV 93.7 07/30/2023   PLT 116 (L) 07/30/2023    Lab Results  Component Value Date   CREATININE 1.82 (H) 07/30/2023   BUN 34 (H) 07/30/2023   NA 135 07/30/2023   K 3.4 (L) 07/30/2023   CL 98 07/30/2023   CO2 26 07/30/2023    Lab Results  Component Value Date   ALT 28 07/30/2023   AST 30 07/30/2023   ALKPHOS 97 07/30/2023   BILITOT 0.7 07/30/2023       [name], MD [facility] 07/30/2023, 10:44 AM   I have personally spent 82 minutes involved in face-to-face and non-face-to-face activities for this patient on the day of the visit. Professional time spent includes the following activities: Preparing to see the patient (review of tests), Obtaining and/or reviewing separately obtained history (admission/discharge record), Performing a medically appropriate examination and/or evaluation , Ordering medications/tests/procedures, referring and communicating with other health care professionals, Documenting clinical information in the EMR, Independently interpreting results (not separately reported), Communicating results to the patient/family/caregiver, Counseling and educating the patient/family/caregiver and Care coordination (not separately reported).

## 2023-07-30 NOTE — ED Provider Notes (Signed)
[place] EMERGENCY DEPARTMENT AT [facility] Provider Note   CSN: [id number] Arrival date & time: 07/30/23  0235     History  Chief Complaint  Patient presents with   Code Sepsis   Weakness   Cough   Fever    CASHTON KUSTRA is a 89 y.o. male.  The history is provided by the EMS personnel. The history is limited by the condition of the patient.  Weakness Severity:  Unable to specify Onset quality:  Gradual Timing:  Constant Progression:  Worsening Context: urinary tract infection   Relieved by:  Nothing Worsened by:  Nothing Ineffective treatments:  None tried Associated symptoms: cough and fever   Associated symptoms: no vomiting   Risk factors: congestive heart failure   Fever Associated symptoms: cough   Associated symptoms: no vomiting   Patient with PAF and TIA presents with weakness, AMS, cough and UTI.      Past Medical History:  Diagnosis Date   Anemia    Arthritis    shoulders and back   Cancer (HCC)    skin cancers   Chronic diastolic (congestive) heart failure (HCC)    Chronic low back pain 12/15/2017   CKD (chronic kidney disease) stage 3, GFR 30-59 ml/min (HCC)    Essential tremor 02/28/2016   GERD (gastroesophageal reflux disease)    Hernia    History of hiatal hernia    Hyperlipemia    Hypertension    Macular degeneration    Neuromuscular disorder (HCC)    neuropathy   Paroxysmal atrial fibrillation (HCC) 10/28/2013   Presence of permanent cardiac pacemaker 12/04/2018   Pulmonary hypertension (HCC)    Stroke (HCC) 12/2020   Tachycardia-bradycardia syndrome (HCC) 09/13/2014   Thoracic aortic aneurysm (HCC)    Thrombocytopenia (HCC)    TIA (transient ischemic attack)      Home Medications Prior to Admission medications   Medication Sig Start Date End Date Taking? Authorizing Provider  apixaban (ELIQUIS) 2.5 MG TABS tablet Take 1 tablet (2.5 mg total) by mouth 2 (two) times daily. 09/24/22  Yes Dunn, Dayna N, PA-C   Ascorbic Acid (VITAMIN C) 1000 MG tablet Take 1,000 mg by mouth daily.   Yes [provider]  DULoxetine (CYMBALTA) 60 MG capsule Take 1 capsule (60 mg total) by mouth daily. 04/23/21  Yes Lovorn, Aundra Millet, MD  furosemide (LASIX) 40 MG tablet Take 1 tablet (40 mg total) by mouth daily. 02/28/23  Yes Chandrasekhar, Mahesh A, MD  levothyroxine (SYNTHROID) 25 MCG tablet Take 1 tablet (25 mcg total) by mouth daily before breakfast. 02/14/21  Yes Angiulli, Mcarthur Rossetti, PA-C  loratadine (CLARITIN) 10 MG tablet Take 10 mg by mouth daily.   Yes [provider]  midodrine (PROAMATINE) 2.5 MG tablet Take 1 tablet (2.5 mg total) by mouth 3 (three) times daily with meals. Patient taking differently: Take 2.5 mg by mouth as needed (Low BP). 10/22/22  Yes Willeen Niece, MD  Multiple Vitamin (MULTIVITAMIN WITH MINERALS) TABS tablet Take 1 tablet by mouth daily.   Yes [provider]  Multiple Vitamins-Minerals (OCUVITE ADULT 50+) CAPS Take 1 capsule by mouth daily.   Yes [provider]  nitrofurantoin, macrocrystal-monohydrate, (MACROBID) 100 MG capsule Take 1 capsule by mouth at bedtime 03/31/23  Yes Vu, Gershon Mussel T, MD  polyethylene glycol (MIRALAX / GLYCOLAX) 17 g packet Take 17 g by mouth daily. Patient taking differently: Take 17 g by mouth daily as needed for mild constipation. 07/08/21  Yes Narda Bonds,  MD  predniSONE (DELTASONE) 5 MG tablet Take 5 mg by mouth daily. 09/12/22  Yes [provider]  pregabalin (LYRICA) 50 MG capsule Take 50 mg by mouth at bedtime.   Yes [provider]  topiramate (TOPAMAX) 50 MG tablet Take 1 tablet (50 mg total) by mouth 2 (two) times daily. Patient taking differently: Take 50 mg by mouth daily. 02/27/22  Yes Butch Penny, NP  traMADol (ULTRAM) 50 MG tablet Take 50 mg by mouth 2 (two) times daily. 01/22/23  Yes [provider]  vitamin B-12 (CYANOCOBALAMIN) 1000 MCG tablet Take 1 tablet (1,000 mcg total) by mouth every  other day. Patient taking differently: Take 1,000 mcg by mouth See admin instructions. oral daily on , Tuesday, Thursday, and Saturday, repeat. 02/14/21  Yes Angiulli, Mcarthur Rossetti, PA-C  acetaminophen (TYLENOL) 325 MG tablet Take 2 tablets (650 mg total) by mouth every 4 (four) hours as needed for mild pain (or temp > 37.5 C (99.5 F)). Patient not taking: Reported on 07/30/2023 02/14/21   Angiulli, Mcarthur Rossetti, PA-C      Allergies    Sulfa antibiotics, Mysoline [primidone], and Zocor [simvastatin]    Review of Systems   Review of Systems  Unable to perform ROS: Acuity of condition  Constitutional:  Positive for fatigue and fever.  Respiratory:  Positive for cough.   Gastrointestinal:  Negative for vomiting.  Neurological:  Positive for weakness.    Physical Exam Updated Vital Signs BP 126/60 (BP Location: Left Arm)   Pulse 71   Temp (!) 103 F (39.4 C) (Oral)   Resp (!) 27   SpO2 (!) 87%  Physical Exam Vitals and nursing note reviewed.  Constitutional:      General: He is not in acute distress.    Appearance: He is well-developed. He is not diaphoretic.  HENT:     Head: Normocephalic and atraumatic.     Nose: Nose normal.  Eyes:     Conjunctiva/sclera: Conjunctivae normal.     Pupils: Pupils are equal, round, and reactive to light.  Cardiovascular:     Rate and Rhythm: Normal rate and regular rhythm.     Pulses: Normal pulses.     Heart sounds: Normal heart sounds.  Pulmonary:     Effort: Pulmonary effort is normal.     Breath sounds: Rhonchi and rales present. No wheezing.  Abdominal:     General: Bowel sounds are normal.     Palpations: Abdomen is soft.     Tenderness: There is no abdominal tenderness. There is no guarding or rebound.  Musculoskeletal:        General: Normal range of motion.     Cervical back: Normal range of motion and neck supple.  Skin:    General: Skin is warm and dry.     Capillary Refill: Capillary refill takes less than 2 seconds.   Neurological:     Mental Status: He is alert.     Deep Tendon Reflexes: Reflexes normal.  Psychiatric:        Mood and Affect: Mood normal.     ED Results / Procedures / Treatments   Labs (all labs ordered are listed, but only abnormal results are displayed) Results for orders placed or performed during the hospital encounter of 07/30/23  Resp panel by RT-PCR (RSV, Flu A&B, Covid) Anterior Nasal Swab   Specimen: Anterior Nasal Swab  Result Value Ref Range   SARS Coronavirus 2 by RT PCR NEGATIVE NEGATIVE   Influenza A  by PCR NEGATIVE NEGATIVE   Influenza B by PCR NEGATIVE NEGATIVE   Resp Syncytial Virus by PCR NEGATIVE NEGATIVE  Comprehensive metabolic panel  Result Value Ref Range   Sodium 135 135 - 145 mmol/L   Potassium 3.4 (L) 3.5 - 5.1 mmol/L   Chloride 98 98 - 111 mmol/L   CO2 26 22 - 32 mmol/L   Glucose, Bld 89 70 - 99 mg/dL   BUN 34 (H) 8 - 23 mg/dL   Creatinine, Ser 9.52 (H) 0.61 - 1.24 mg/dL   Calcium 9.4 8.9 - 84.1 mg/dL   Total Protein 8.4 (H) 6.5 - 8.1 g/dL   Albumin 3.5 3.5 - 5.0 g/dL   AST 30 15 - 41 U/L   ALT 28 0 - 44 U/L   Alkaline Phosphatase 97 38 - 126 U/L   Total Bilirubin 0.7 0.3 - 1.2 mg/dL   GFR, Estimated 34 (L) >60 mL/min   Anion gap 11 5 - 15  CBC with Differential  Result Value Ref Range   WBC 9.6 4.0 - 10.5 K/uL   RBC 3.80 (L) 4.22 - 5.81 MIL/uL   Hemoglobin 11.4 (L) 13.0 - 17.0 g/dL   HCT 32.4 (L) 40.1 - 02.7 %   MCV 93.7 80.0 - 100.0 fL   MCH 30.0 26.0 - 34.0 pg   MCHC 32.0 30.0 - 36.0 g/dL   RDW 25.3 (H) 66.4 - 40.3 %   Platelets 116 (L) 150 - 400 K/uL   nRBC 0.0 0.0 - 0.2 %   Neutrophils Relative % 96 %   Neutro Abs 9.2 (H) 1.7 - 7.7 K/uL   Lymphocytes Relative 2 %   Lymphs Abs 0.2 (L) 0.7 - 4.0 K/uL   Monocytes Relative 1 %   Monocytes Absolute 0.1 0.1 - 1.0 K/uL   Eosinophils Relative 0 %   Eosinophils Absolute 0.0 0.0 - 0.5 K/uL   Basophils Relative 0 %   Basophils Absolute 0.0 0.0 - 0.1 K/uL   Immature Granulocytes 1 %    Abs Immature Granulocytes 0.06 0.00 - 0.07 K/uL  Protime-INR  Result Value Ref Range   Prothrombin Time 15.3 (H) 11.4 - 15.2 seconds   INR 1.2 0.8 - 1.2  APTT  Result Value Ref Range   aPTT 27 24 - 36 seconds  I-Stat Lactic Acid, ED  Result Value Ref Range   Lactic Acid, Venous 2.4 (HH) 0.5 - 1.9 mmol/L   Comment NOTIFIED PHYSICIAN    DG Chest Port 1 View  Result Date: 07/30/2023 CLINICAL DATA:  Questionable sepsis - evaluate for abnormality Confusion,shaking, ? Sepsis, hypoxia EXAM: PORTABLE CHEST 1 VIEW.  Patient rotated. COMPARISON:  Chest x-ray 02/26/2023 FINDINGS: Left chest wall 2 lead pacemaker. Limited evaluation of the heart and mediastinal contours due to patient rotation. Aortic calcification. Interval development of diffuse interstitial and airspace opacities. No pleural effusion. No pneumothorax. No acute osseous abnormality. IMPRESSION: 1. Limited evaluation of the heart and mediastinal contours due to patient rotation. Recommend repeat PA and lateral view of the chest for further evaluation. 2. Interval development of diffuse interstitial and airspace opacities. Finding may represent a combination of infection and/or inflammation. 3.  Aortic Atherosclerosis (ICD10-I70.0). Electronically Signed   By: Tish Frederickson M.D.   On: 07/30/2023 03:25   CUP PACEART REMOTE DEVICE CHECK  Result Date: 07/24/2023 Scheduled remote reviewed. Normal device function.  There was one NSVT arrhythmia detected Next remote 91 days. Hassell Halim, RN, CCDS, [employer]   EKG EKG  Interpretation Date/Time:  Wednesday July 30 2023 02:45:06 EDT Ventricular Rate:  69 PR Interval:    QRS Duration:  125 QT Interval:  382 QTC Calculation: 410 R Axis:   -19  Text Interpretation: NSR Confirmed by Rocsi Hazelbaker ([id number]) on 07/30/2023 3:14:33 AM  Radiology DG Chest Port 1 View  Result Date: 07/30/2023 CLINICAL DATA:  Questionable sepsis - evaluate for abnormality Confusion,shaking, ?  Sepsis, hypoxia EXAM: PORTABLE CHEST 1 VIEW.  Patient rotated. COMPARISON:  Chest x-ray 02/26/2023 FINDINGS: Left chest wall 2 lead pacemaker. Limited evaluation of the heart and mediastinal contours due to patient rotation. Aortic calcification. Interval development of diffuse interstitial and airspace opacities. No pleural effusion. No pneumothorax. No acute osseous abnormality. IMPRESSION: 1. Limited evaluation of the heart and mediastinal contours due to patient rotation. Recommend repeat PA and lateral view of the chest for further evaluation. 2. Interval development of diffuse interstitial and airspace opacities. Finding may represent a combination of infection and/or inflammation. 3.  Aortic Atherosclerosis (ICD10-I70.0). Electronically Signed   By: Tish Frederickson M.D.   On: 07/30/2023 03:25    Procedures Procedures    Medications Ordered in ED Medications  lactated ringers infusion (has no administration in time range)  ceFEPIme (MAXIPIME) 2 g in sodium chloride 0.9 % 100 mL IVPB (has no administration in time range)  metroNIDAZOLE (FLAGYL) IVPB 500 mg (has no administration in time range)  vancomycin (VANCOREADY) IVPB 1750 mg/350 mL (has no administration in time range)  acetaminophen (TYLENOL) tablet 1,000 mg (has no administration in time range)    ED Course/ Medical Decision Making/ A&P                                 Medical Decision Making Patient with cough fever global weakness and sediment in urin   Amount and/or Complexity of Data Reviewed Independent Historian: EMS    Details: See above  External Data Reviewed: labs and notes.    Details: Previous ED visits reviewed  Labs: ordered.    Details: Negative covid and flu. Elevated lactate 2.4, normal sodium 135, potassium 3.4, elevated creatinine 1.82, normal LFTS, normal white count 9.6, hemoglobin low 11.4, low platelets 116  Radiology: ordered. Decision-making details documented in ED Course.    Details: CHF and PNA by  me  ECG/medicine tests: ordered and independent interpretation performed. Decision-making details documented in ED Course. Discussion of management or test interpretation with external provider(s): Admit to Dr. Margo Aye   Risk OTC drugs. Prescription drug management. Decision regarding hospitalization. Risk Details: Give EF cannot start IV boluses.  Normotensive   Critical Care Total time providing critical care: 60 minutes (Sepsis bundle initiated complexity of care, potential for serious outcomes )   Final Clinical Impression(s) / ED Diagnoses Final diagnoses:  Sepsis, due to unspecified organism, unspecified whether acute organ dysfunction present [facility])   The patient appears reasonably stabilized for admission considering the current resources, flow, and capabilities available in the ED at this time, and I doubt any other [facility] requiring further screening and/or treatment in the ED prior to admission.  Rx / DC Orders ED Discharge Orders     None         Sevana Grandinetti, MD 07/30/23 6045

## 2023-07-31 ENCOUNTER — Inpatient Hospital Stay (HOSPITAL_COMMUNITY): Payer: Medicare Other

## 2023-07-31 DIAGNOSIS — N179 Acute kidney failure, unspecified: Secondary | ICD-10-CM | POA: Diagnosis not present

## 2023-07-31 DIAGNOSIS — R6520 Severe sepsis without septic shock: Secondary | ICD-10-CM | POA: Diagnosis not present

## 2023-07-31 DIAGNOSIS — A419 Sepsis, unspecified organism: Secondary | ICD-10-CM | POA: Diagnosis not present

## 2023-07-31 LAB — HEPARIN LEVEL (UNFRACTIONATED): Heparin Unfractionated: >1.1 IU/mL — ABNORMAL HIGH (ref 0.30–0.70)

## 2023-07-31 LAB — APTT: aPTT: 79 seconds — ABNORMAL HIGH (ref 24–36)

## 2023-07-31 MED ORDER — HEPARIN (PORCINE) 25000 UT/250ML-% IV SOLN
1300.0000 [IU]/h | INTRAVENOUS | Status: DC
  Administered 2023-07-31: 1200 [IU]/h via INTRAVENOUS
  Administered 2023-08-01: 1300 [IU]/h via INTRAVENOUS
  Filled 2023-07-31 (×2): qty 250

## 2023-07-31 MED ORDER — HEPARIN BOLUS VIA INFUSION
2000.0000 [IU] | Freq: Once | INTRAVENOUS | Status: AC
  Administered 2023-07-31: 2000 [IU] via INTRAVENOUS
  Filled 2023-07-31: qty 2000

## 2023-07-31 MED ORDER — HYDRALAZINE HCL 20 MG/ML IJ SOLN: 10.0000 mg | Freq: Four times a day (QID) | INTRAMUSCULAR | Status: DC | PRN

## 2023-07-31 MED ORDER — LACTATED RINGERS IV SOLN: INTRAVENOUS | Status: AC

## 2023-07-31 NOTE — Progress Notes (Signed)
ANTICOAGULATION CONSULT NOTE - Initial Consult  Pharmacy Consult for Heparin Indication: atrial fibrillation, PTA Eliquis held  Allergies  Allergen Reactions   Sulfa Antibiotics Other (See Comments)    Weakness Dizziness  Sweats    Mysoline [Primidone] Other (See Comments)    Sedation    Zocor [Simvastatin] Other (See Comments)    Arthralgias Fatigue    Patient Measurements: Height: 5\' 9"  (175.3 cm) (Per son) Weight: 87.5 kg (192 lb 14.4 oz) IBW/kg (Calculated) : 70.7 Heparin Dosing Weight: TBW  Vital Signs: Temp: 98.2 F (36.8 C) (08/01 0700) Temp Source: Oral (08/01 0700) BP: 119/42 (08/01 0700) Pulse Rate: 67 (08/01 0800)  Labs: Recent Labs    07/29/23 1445 07/30/23 0247 07/31/23 0312  HGB 11.0* 11.4* 10.0*  HCT 33.7* 35.6* 30.9*  PLT 102* 116* 81*  APTT  --  27  --   LABPROT  --  15.3*  --   INR  --  1.2  --   CREATININE 1.76* 1.82* 2.05*    Estimated Creatinine Clearance: 23.6 mL/min (A) (by C-G formula based on SCr of 2.05 mg/dL (H)).   Medical History: Past Medical History:  Diagnosis Date   Anemia    Arthritis    shoulders and back   Cancer (HCC)    skin cancers   Chronic diastolic (congestive) heart failure (HCC)    Chronic low back pain 12/15/2017   CKD (chronic kidney disease) stage 3, GFR 30-59 ml/min (HCC)    Essential tremor 02/28/2016   GERD (gastroesophageal reflux disease)    Hernia    History of hiatal hernia    Hyperlipemia    Hypertension    Macular degeneration    Neuromuscular disorder (HCC)    neuropathy   Paroxysmal atrial fibrillation (HCC) 10/28/2013   Presence of permanent cardiac pacemaker 12/04/2018   Pulmonary hypertension (HCC)    Stroke (HCC) 12/2020   Tachycardia-bradycardia syndrome (HCC) 09/13/2014   Thoracic aortic aneurysm (HCC)    Thrombocytopenia (HCC)    TIA (transient ischemic attack)     Medications:  Scheduled:   apixaban  2.5 mg Oral BID   Chlorhexidine Gluconate Cloth  6 each Topical Daily    DULoxetine  60 mg Oral Daily   hydrocortisone sod succinate (SOLU-CORTEF) inj  100 mg Intravenous Q12H   levothyroxine  25 mcg Oral QAC breakfast   loratadine  10 mg Oral Daily   midodrine  5 mg Oral TID WC   pregabalin  50 mg Oral QHS   Infusions:   sodium chloride Stopped (07/30/23 2325)   azithromycin 500 mg (07/31/23 0846)   lactated ringers 75 mL/hr at 07/31/23 0846   meropenem (MERREM) IV Stopped (07/30/23 2208)   norepinephrine (LEVOPHED) Adult infusion Stopped (07/30/23 1848)    Assessment: 89 yoM admitted on 7/31 with sepsis.  PMH significant for afib on prior to admission Eliquis.  Pharmacy is consulted to dose Heparin while he is now NPO.    PTA apixaban initially resumed on admission - last dose given 7/31 at 10am.  Expect falsely elevated heparin level due to recent DOAC use.  Will use aPTT for dosing/titration until levels correlate.   SCr increasing, 2.05 CBC: Hgb decreased to 10, Plt down to 81k  No bleeding or complications reported.  Goal of Therapy:  Heparin level 0.3-0.7 units/ml aPTT 66-102 seconds Monitor platelets by anticoagulation protocol: Yes   Plan:  D/C apixaban Give heparin 2000 units bolus IV x 1 (partial bolus dose d/t DOAC, AKI, anemia)  Start  heparin IV infusion at 1200 units/hr Heparin level, aPTT in 8 hours after starting Daily heparin level and CBC   Lynann Beaver PharmD, BCPS [facility] main pharmacy [phone] 07/31/2023 9:34 AM

## 2023-07-31 NOTE — Evaluation (Signed)
Clinical/Bedside Swallow Evaluation Patient Details  Name: Douglas Walker MRN: [id number] Date of Birth: [birth year]  Today's Date: 07/31/2023 Time: SLP Start Time (ACUTE ONLY): 1455 SLP Stop Time (ACUTE ONLY): 1515 SLP Time Calculation (min) (ACUTE ONLY): 20 min  Past Medical History:  Past Medical History:  Diagnosis Date   Anemia    Arthritis    shoulders and back   Cancer (HCC)    skin cancers   Chronic diastolic (congestive) heart failure (HCC)    Chronic low back pain 12/15/2017   CKD (chronic kidney disease) stage 3, GFR 30-59 ml/min (HCC)    Essential tremor 02/28/2016   GERD (gastroesophageal reflux disease)    Hernia    History of hiatal hernia    Hyperlipemia    Hypertension    Macular degeneration    Neuromuscular disorder (HCC)    neuropathy   Paroxysmal atrial fibrillation (HCC) 10/28/2013   Presence of permanent cardiac pacemaker 12/04/2018   Pulmonary hypertension (HCC)    Stroke (HCC) 12/2020   Tachycardia-bradycardia syndrome (HCC) 09/13/2014   Thoracic aortic aneurysm (HCC)    Thrombocytopenia (HCC)    TIA (transient ischemic attack)    Past Surgical History:  Past Surgical History:  Procedure Laterality Date   APPENDECTOMY     EYE SURGERY     HERNIA REPAIR     HERNIA REPAIR     INSERT / REPLACE / REMOVE PACEMAKER  12/04/2018   IRRIGATION AND DEBRIDEMENT ABSCESS Right 07/24/2020   Procedure: IRRIGATION AND DEBRIDEMENT HEMATOMA;  Surgeon: Abigail Miyamoto, MD;  [facility];  Service: General;  Laterality: Right;   MASS EXCISION Right 07/20/2020   Procedure: EXCISION OF RIGHT CHEST WALL MASS;  Surgeon: Almond Lint, MD;  Location: [facility] OR;  Service: General;  Laterality: Right;   PACEMAKER IMPLANT N/A 12/04/2018   Procedure: PACEMAKER IMPLANT;  Surgeon: Marinus Maw, MD;  Location: [facility] INVASIVE CV LAB;  Service: Cardiovascular;  Laterality: N/A;   HPI:  Pt is a 89 yo male admitted 7/31 with sepsis from PNA +/- recurrent UTI. Pt has a h/o  dysphagia after stroke with silent aspiration of thin liquids, but on his most recent MBS (March 2022) showed mild impairment with bolus cohesion and piecemeal swallowing with solids. He had occasional penetration of thin liquids before the swallow that reached the vocal folds but was not aspirated (PAS 5). Dys 3 diet and thin liquids were recommended. PMH includes: dementia, GERD, HH, stroke, CHF, PAF, recurrent UTI, indwelling foley, chronic prostitis, low grade renal cell carcinoma of the bladder    Assessment / Plan / Recommendation  Clinical Impression  Pt has a h/o dysphagia with difficulty primarily with thin liquids (although recommended to have them on most recent MBS). Today he has occasional coughing after thin liquid trials only. He also cognitively has difficulty with bolus awareness especially as he transitions between different consistencies, but also with solids in general. Not sure how this compares to his baseline given his sensory deficits and cognitive status, but in light of acute infection and deconditioning, would favor starting more conservatively with PO diet. Will initiate Dys 1 (puree) diet and nectar thick liquids for now with additional SLP f/u warranted. SLP Visit Diagnosis: Dysphagia, unspecified (R13.10)    Aspiration Risk  Moderate aspiration risk;Risk for inadequate nutrition/hydration    Diet Recommendation Dysphagia 1 (Puree);Nectar-thick liquid    Liquid Administration via: Cup;Straw Medication Administration: Crushed with puree Supervision: Staff to assist with self feeding;Full supervision/cueing for compensatory strategies Compensations: Minimize  environmental distractions;Slow rate;Small sips/bites;Other (Comment) (help cue pt when transitioning between solids and liquids) Postural Changes: Seated upright at 90 degrees    Other  Recommendations Oral Care Recommendations: Oral care BID Caregiver Recommendations: Avoid jello, ice cream, thin soups,  popsicles;Remove water pitcher;Have oral suction available    Recommendations for follow up therapy are one component of a multi-disciplinary discharge planning process, led by the attending physician.  Recommendations may be updated based on patient status, additional functional criteria and insurance authorization.  Follow up Recommendations  (tba)      Assistance Recommended at Discharge    Functional Status Assessment Patient has had a recent decline in their functional status and demonstrates the ability to make significant improvements in function in a reasonable and predictable amount of time.  Frequency and Duration min 2x/week  2 weeks       Prognosis Prognosis for improved oropharyngeal function: Fair Barriers to Reach Goals: Cognitive deficits      Swallow Study   General HPI: Pt is a 89 yo male admitted 7/31 with sepsis from PNA +/- recurrent UTI. Pt has a h/o dysphagia after stroke with silent aspiration of thin liquids, but on his most recent MBS (March 2022) showed mild impairment with bolus cohesion and piecemeal swallowing with solids. He had occasional penetration of thin liquids before the swallow that reached the vocal folds but was not aspirated (PAS 5). Dys 3 diet and thin liquids were recommended. PMH includes: dementia, GERD, HH, stroke, CHF, PAF, recurrent UTI, indwelling foley, chronic prostitis, low grade renal cell carcinoma of the bladder Type of Study: Bedside Swallow Evaluation Previous Swallow Assessment: see HPI Diet Prior to this Study: NPO Temperature Spikes Noted: No Respiratory Status: Nasal cannula History of Recent Intubation: No Behavior/Cognition: Alert;Cooperative;Requires cueing Oral Cavity Assessment: Within Functional Limits Oral Care Completed by SLP: No Oral Cavity - Dentition: Dentures, top;Dentures, bottom Vision: Impaired for self-feeding Self-Feeding Abilities: Total assist Patient Positioning: Upright in bed Baseline Vocal  Quality: Normal Volitional Cough: Weak Volitional Swallow: Able to elicit    Oral/Motor/Sensory Function Overall Oral Motor/Sensory Function: Within functional limits   Ice Chips Ice chips: Within functional limits Presentation: Spoon   Thin Liquid Thin Liquid: Impaired Presentation: Cup;Spoon;Straw Oral Phase Impairments: Poor awareness of bolus Pharyngeal  Phase Impairments: Cough - Delayed    Nectar Thick Nectar Thick Liquid: Impaired Oral Phase Impairments: Poor awareness of bolus   Honey Thick Honey Thick Liquid: Not tested   Puree Puree: Impaired Presentation: Spoon Oral Phase Impairments: Poor awareness of bolus   Solid     Solid: Impaired Oral Phase Impairments: Impaired mastication;Poor awareness of bolus      Mahala Menghini., M.A. CCC-SLP Acute Rehabilitation Services Office [phone]  Secure chat preferred  07/31/2023,3:32 PM

## 2023-07-31 NOTE — Progress Notes (Signed)
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  UA showed negative nitrites and large leukocytes - Currently on meropenem and azithromycin.  Patient is unable to provide comprehensive history.  I reviewed [name] note from 7/30, he was appeared to be stable at that point.  As such I suspect his symptoms are secondary to pneumonia.   Recommendations:  -Continue meropenem, azithromycin to cover any atypicals.If cx remain negative, will consider transitioning to unasyn for possible aspiration PNA.  - UDS positive for barbiturates on 01/28/2021 - Follow urine(obtained form foley from 7/4) and blood cultures - F/U  Legionella antigen - Sputum cultures - Foley exchange today, last time Foley was exchanged appears to be on 7/4. Microbiology:   Antibiotics: Vancomycin, cefepime, metronidazole 7/30- Azithromycn , merrem 7/31-   Cultures: Blood 7/31 2/2 NG Urine 7/31 pending Other    SUBJECTIVE: Resting in bed.  Interval: Afebrile overnight. Wbc 24.7k  Review of Systems: Review of Systems  All other systems reviewed and are negative.    Scheduled Meds:  Chlorhexidine Gluconate Cloth  6 each Topical Daily   DULoxetine  60 mg Oral Daily   heparin  2,000 Units Intravenous Once   hydrocortisone sod succinate (SOLU-CORTEF) inj  100 mg Intravenous Q12H   levothyroxine  25 mcg Oral QAC  breakfast   loratadine  10 mg Oral Daily   midodrine  5 mg Oral TID WC   pregabalin  50 mg Oral QHS   Continuous Infusions:  sodium chloride Stopped (07/30/23 2325)   azithromycin Stopped (07/31/23 0948)   heparin     lactated ringers 75 mL/hr at 07/31/23 1028   meropenem (MERREM) IV Stopped (07/31/23 1040)   norepinephrine (LEVOPHED) Adult infusion Stopped (07/30/23 1848)   PRN Meds:.acetaminophen **OR** acetaminophen, albuterol, docusate sodium, ondansetron **OR** ondansetron (ZOFRAN) IV, mouth rinse, polyethylene glycol, senna-docusate, traMADol Allergies  Allergen Reactions   Sulfa Antibiotics Other (See Comments)    Weakness Dizziness  Sweats    Mysoline [Primidone] Other (See Comments)    Sedation    Zocor [Simvastatin] Other (See Comments)    Arthralgias Fatigue    OBJECTIVE: Vitals:   07/31/23 0815 07/31/23 0830 07/31/23 0845 07/31/23 0900  BP:   (!) 98/47 (!) 99/44  Pulse: 70 69 70 (!) 133  Resp: 19 (!) 23 (!) 27 (!) 23  Temp:      TempSrc:      SpO2: 99% 99% 100% 90%  Weight:      Height:       Body mass index is 28.49 kg/m.  Physical Exam Constitutional:      General: He is not in acute distress.    Appearance: He is normal weight. He is not toxic-appearing.  HENT:     Head: Normocephalic and atraumatic.     Right Ear: External ear normal.     Left Ear: External ear normal.     Nose: No congestion or rhinorrhea.  Mouth/Throat:     Mouth: Mucous membranes are moist.     Pharynx: Oropharynx is clear.  Eyes:     Extraocular Movements: Extraocular movements intact.     Conjunctiva/sclera: Conjunctivae normal.     Pupils: Pupils are equal, round, and reactive to light.  Cardiovascular:     Rate and Rhythm: Normal rate and regular rhythm.     Heart sounds: No murmur heard.    No friction rub. No gallop.  Pulmonary:     Effort: Pulmonary effort is normal.     Breath sounds: Normal breath sounds.  Abdominal:     General: Abdomen is flat. Bowel  sounds are normal.     Palpations: Abdomen is soft.  Musculoskeletal:        General: No swelling.     Cervical back: Normal range of motion and neck supple.  Skin:    General: Skin is warm and dry.  Neurological:     General: No focal deficit present.     Mental Status: He is oriented to person, place, and time.  Psychiatric:        Mood and Affect: Mood normal.       Lab Results Lab Results  Component Value Date   WBC 24.7 (H) 07/31/2023   HGB 10.0 (L) 07/31/2023   HCT 30.9 (L) 07/31/2023   MCV 94.5 07/31/2023   PLT 81 (L) 07/31/2023    Lab Results  Component Value Date   CREATININE 2.05 (H) 07/31/2023   BUN 41 (H) 07/31/2023   NA 138 07/31/2023   K 3.9 07/31/2023   CL 104 07/31/2023   CO2 24 07/31/2023    Lab Results  Component Value Date   ALT 21 07/31/2023   AST 24 07/31/2023   ALKPHOS 67 07/31/2023   BILITOT 0.9 07/31/2023        [name], MD [facility] 07/31/2023, 11:26 AM   I have personally spent 52 minutes involved in face-to-face and non-face-to-face activities for this patient on the day of the visit. Professional time spent includes the following activities: Preparing to see the patient (review of tests), Obtaining and/or reviewing separately obtained history (admission/discharge record), Performing a medically appropriate examination and/or evaluation , Ordering medications/tests/procedures, referring and communicating with other health care professionals, Documenting clinical information in the EMR, Independently interpreting results (not separately reported), Communicating results to the patient/family/caregiver, Counseling and educating the patient/family/caregiver and Care coordination (not separately reported).

## 2023-07-31 NOTE — Progress Notes (Signed)
eLink Physician-Brief Progress Note Patient Name: Douglas Walker DOB: [birth year] MRN: [id number]   Date of Service  07/31/2023  HPI/Events of Note  Patient BP trending up. LAst BP 161/79 (109). Bedside requesting PRN meds. Pt does have hx of HTN   Not on home antihypertensives. On home midodrine which is being continued. Also on hydrocortisone  eICU Interventions  Discontinue midodrine Consider discontinuing hydrocortisone if being given for shock  Pt BP trending up. LAst BP 161/79 (109). Bedside requesting PRN meds. Pt does have hx of HTN    Intervention Category Intermediate Interventions: Hypertension - evaluation and management  Darl Pikes 07/31/2023, 11:55 PM

## 2023-07-31 NOTE — Progress Notes (Addendum)
ANTICOAGULATION CONSULT NOTE - Initial Consult  Pharmacy Consult for Heparin Indication: atrial fibrillation, PTA Eliquis held  Allergies  Allergen Reactions   Sulfa Antibiotics Other (See Comments)    Weakness Dizziness  Sweats    Mysoline [Primidone] Other (See Comments)    Sedation    Zocor [Simvastatin] Other (See Comments)    Arthralgias Fatigue    Patient Measurements: Height: 5\' 9"  (175.3 cm) (Per son) Weight: 87.5 kg (192 lb 14.4 oz) IBW/kg (Calculated) : 70.7  Vital Signs: Temp: 98.6 F (37 C) (08/01 1548) Temp Source: Oral (08/01 1548) BP: 138/68 (08/01 1800) Pulse Rate: 70 (08/01 1800)  Labs: Recent Labs    07/29/23 1445 07/30/23 0247 07/31/23 0312 07/31/23 1853  HGB 11.0* 11.4* 10.0*  --   HCT 33.7* 35.6* 30.9*  --   PLT 102* 116* 81*  --   APTT  --  27  --  79*  LABPROT  --  15.3*  --   --   INR  --  1.2  --   --   HEPARINUNFRC  --   --   --  >1.10*  CREATININE 1.76* 1.82* 2.05*  --     Estimated Creatinine Clearance: 23.6 mL/min (A) (by C-G formula based on SCr of 2.05 mg/dL (H)).   Medical History: Past Medical History:  Diagnosis Date   Anemia    Arthritis    shoulders and back   Cancer (HCC)    skin cancers   Chronic diastolic (congestive) heart failure (HCC)    Chronic low back pain 12/15/2017   CKD (chronic kidney disease) stage 3, GFR 30-59 ml/min (HCC)    Essential tremor 02/28/2016   GERD (gastroesophageal reflux disease)    Hernia    History of hiatal hernia    Hyperlipemia    Hypertension    Macular degeneration    Neuromuscular disorder (HCC)    neuropathy   Paroxysmal atrial fibrillation (HCC) 10/28/2013   Presence of permanent cardiac pacemaker 12/04/2018   Pulmonary hypertension (HCC)    Stroke (HCC) 12/2020   Tachycardia-bradycardia syndrome (HCC) 09/13/2014   Thoracic aortic aneurysm (HCC)    Thrombocytopenia (HCC)    TIA (transient ischemic attack)     Medications:  Scheduled:   Chlorhexidine Gluconate  Cloth  6 each Topical Daily   DULoxetine  60 mg Oral Daily   hydrocortisone sod succinate (SOLU-CORTEF) inj  100 mg Intravenous Q12H   levothyroxine  25 mcg Oral QAC breakfast   loratadine  10 mg Oral Daily   midodrine  5 mg Oral TID WC   pregabalin  50 mg Oral QHS   Infusions:   sodium chloride Stopped (07/30/23 2325)   azithromycin Stopped (07/31/23 0948)   heparin 1,200 Units/hr (07/31/23 1225)   meropenem (MERREM) IV Stopped (07/31/23 1040)   norepinephrine (LEVOPHED) Adult infusion Stopped (07/30/23 1848)    Assessment: 89 yoM admitted on 7/31 with sepsis.  PMH significant for afib on prior to admission Eliquis.  Pharmacy is consulted to dose Heparin while he is now NPO.    PTA apixaban initially resumed on admission - last dose given 7/31 at 10am.  Expect falsely elevated heparin level due to recent DOAC use.     SCr increasing, 2.05 CBC: Hgb decreased to 10, Plt down to 81k   This evening,  18:53 HL falsely elevated at >1.1 due to recent apixaban use as expected 18:53 aPTT 79, therapeutic, with heparin infusing at 1200 units/hr No bleeding or complications reported.  Goal of  Therapy:  Heparin level 0.3-0.7 units/ml aPTT 66-102 seconds Monitor platelets by anticoagulation protocol: Yes   Plan:  Continue heparin IV infusion at 1200 units/hr Confirmatory 8 hour Heparin level & aPTT Monitor daily aPTT (until aPTT & heparin levels correlate), heparin level, CBC, signs/symptoms of bleeding    Thank you for allowing pharmacy to be a part of this patient's care.  Selinda Eon, PharmD, BCPS Clinical Pharmacist [facility] 07/31/2023 7:44 PM

## 2023-07-31 NOTE — Progress Notes (Addendum)
NAME:  VERNA HAMON, MRN:  [id number], DOB:  10-Oct-1925, LOS: 1 ADMISSION DATE:  07/30/2023, CONSULTATION DATE:  07/30/23 REFERRING MD: Teena Dunk, MD CHIEF COMPLAINT:  Hypotension   History of Present Illness:  89 year old with PMHx noted below admitted for sepsis secondary to CAP +/- recurrent UTI. Unable to obtain history due to dementia. No family at bedside. History obtained from chart review.   He was reported in usual state of health until fever 101 associated with confusion, weakness and shaking. Reported productive cough but unclear if this is baseline. EMS found patient with SpO2 80% on room air and started on nasal cannula. In the ED he was febrile to 103 and 89% on 3L. Blood pressure was initially 126/60. He received 500 cc IVF, vanc, cefepime and flagyl and admitted by [facility].  This morning he was hypotensive with SBP 80s. PCCM was consulted. Despite additional 1.5L he continued to be hypotensive with SBP in 80s. Will admit/transfer to ICU for peripheral pressors. Primary team communicated DNR/DNI was confirmed by family but ok with vasopressor support.   Pertinent  Medical History  Dementia, chronic diastolic congestive heart failure, s/p PM,  GERD, essential tremor, hyperlipidemia, paroxysmal atrial fibrillation on Eliquis, pulmonary hypertension   Significant Hospital Events: Including procedures, antibiotic start and stop dates in addition to other pertinent events     Interim History / Subjective:  Eyes open and answering some questions. Baseline unclear Weaned off levophed  Objective   Blood pressure (!) 119/42, pulse 66, temperature 98.2 F (36.8 C), temperature source Oral, resp. rate (!) 23, height 5\' 9"  (1.753 m), weight 87.5 kg, SpO2 94%.        Intake/Output Summary (Last 24 hours) at 07/31/2023 0820 Last data filed at 07/31/2023 0300 Gross per 24 hour  Intake 3358.25 ml  Output 1050 ml  Net 2308.25 ml   Filed Weights   07/30/23 1156 07/31/23 0324   Weight: 86.7 kg 87.5 kg   Physical Exam: General: Chronically ill-appearing, no acute distress HENT: [place], AT, OP clear, MMM Eyes: EOMI, no scleral icterus Respiratory: Clear to auscultation bilaterally.  No crackles, wheezing or rales Cardiovascular: RRR, -M/R/G, no JVD GI: BS+, soft, nontender Extremities:-Edema,-tenderness Neuro: Eyes open, more alert, answers simple questions, follows some commands GU: Foley in place  CXR - Diffuse interstitial airspace opacities  WBC increased 24 BUN/Cr 41/2.05 increased LA improved 1.7 TSH and ammonia wnl  Resolved Hospital Problem list     Assessment & Plan:   Septic shock secondary to CAP vs recurrent UTI Hx MDR UTI on chronic macrobid Peripheral levophed ordered for MAP goal >65 S/p 1.5 L IVF LA cleared Plan Off levophed Continue meropenem and azithro per ID F/u culture data. Strep pneumo neg. Legionella pending Consider weaning stress dose steroids tomorrow Continue home midodrine  AoCKD IIIB - slightly worsened. Poor po intake Plan Start LR @75  cc/hr x 10 hours Monitor UOP/Cr Avoid nephrotoxic agents  Acute encephalopathy secondary to sepsis - improving but ?aphasic vs difficulty hearing this morning Dementia TSH, ammonia wnl Plan Discussed with son. Baseline is patient can speak but not talkative from prior stroke, able bathe himself. Reports zoning out so this may be baseline. Will order CT head to r/o acute changes Speech for swallow eval Reorient as able  Atrial fibrillation Tele Start heparin gtt  Hypothyroidism Continue levothyroxine  Chronic pain Continue Cymbalta and Lyrica  GOC Son confirms DNR/DNI with PCCM team. Unsure if his father would want a central line. Would  like to be included in decision for central line access if this is indicated in the future.  Best Practice (right click and "Reselect all SmartList Selections" daily)   Diet/type: NPO DVT prophylaxis: other GI prophylaxis:  N/A Lines: N/A Foley:  N/A Code Status:  DNR Last date of multidisciplinary goals of care discussion [ 07/30/23] TRH confirmed DNR/DNI   Critical care time:  N/A    Care Time: 50 min  Mechele Collin, M.D. [facility] Pulmonary/Critical Care Medicine 07/31/2023 8:20 AM   See Amion for personal pager For hours between 7 PM to 7 AM, please call Elink for urgent questions

## 2023-08-01 DIAGNOSIS — A419 Sepsis, unspecified organism: Secondary | ICD-10-CM | POA: Diagnosis not present

## 2023-08-01 DIAGNOSIS — R6520 Severe sepsis without septic shock: Secondary | ICD-10-CM | POA: Diagnosis not present

## 2023-08-01 DIAGNOSIS — N179 Acute kidney failure, unspecified: Secondary | ICD-10-CM | POA: Diagnosis not present

## 2023-08-01 LAB — APTT: aPTT: 54 seconds — ABNORMAL HIGH (ref 24–36)

## 2023-08-01 MED ORDER — HYDRALAZINE HCL 20 MG/ML IJ SOLN
10.0000 mg | Freq: Four times a day (QID) | INTRAMUSCULAR | Status: DC | PRN
  Administered 2023-08-01 – 2023-08-02 (×2): 10 mg via INTRAVENOUS
  Filled 2023-08-01 (×2): qty 1

## 2023-08-01 MED ORDER — SODIUM CHLORIDE 0.9 % IV SOLN
3.0000 g | Freq: Two times a day (BID) | INTRAVENOUS | Status: DC
  Administered 2023-08-01 – 2023-08-03 (×4): 3 g via INTRAVENOUS
  Filled 2023-08-01 (×4): qty 8

## 2023-08-01 MED ORDER — HEPARIN (PORCINE) 25000 UT/250ML-% IV SOLN
1500.0000 [IU]/h | INTRAVENOUS | Status: DC
  Administered 2023-08-02 – 2023-08-03 (×3): 1500 [IU]/h via INTRAVENOUS
  Filled 2023-08-01 (×3): qty 250

## 2023-08-01 NOTE — Progress Notes (Signed)
Speech Language Pathology Treatment: Dysphagia  Patient Details Name: Douglas Walker MRN: [id number] DOB: 1925-10-02 Today's Date: 08/01/2023 Time: 4401-0272 SLP Time Calculation (min) (ACUTE ONLY): 14 min  Assessment / Plan / Recommendation Clinical Impression  Patient seen today for dysphagia management after his clinical swallow evaluation yesterday. RN reports he consumed 75% of his breakfast without coughing. Douglas Walker is fully alert and willing to consume p.o. with this SLP. Facilitated intake of 4 graham crackers, 4 ounces of apple sauce, 4 ounces of apple juice and 2 ounces of water. No clinical indication of aspiration across all p.o. intake, patient does have history of penetration of thin to the vocal cords without clearance and given current pneumonia recommend to continue nectar thick liquids allowing thin water between meals. Oral pocketing of solids on the left noted without patient full awareness however adequate clearance with cue. Patient does admit to pocketing of food at times. Recommend advance diet to dysphagia 2 nectar allowing thin water between meals with compensation strategies. Suspect he will be appropriate to advance back to his premorbid diet as medically stabilizes.     HPI HPI: Pt is a 89 yo male admitted 7/31 with sepsis from PNA +/- recurrent UTI. Pt has a h/o dysphagia after stroke with silent aspiration of thin liquids, but on his most recent MBS (March 2022) showed mild impairment with bolus cohesion and piecemeal swallowing with solids. He had occasional penetration of thin liquids before the swallow that reached the vocal folds but was not aspirated (PAS 5). Dys 3 diet and thin liquids were recommended. PMH includes: dementia, GERD, HH, stroke, CHF, PAF, recurrent UTI, indwelling foley, chronic prostitis, low grade renal cell carcinoma of the bladder.  Patient seen for follow-up for dysphagia management.      SLP Plan  Continue with current plan of care       Recommendations for follow up therapy are one component of a multi-disciplinary discharge planning process, led by the attending physician.  Recommendations may be updated based on patient status, additional functional criteria and insurance authorization.    Recommendations  Diet recommendations: Dysphagia 2 (fine chop);Nectar-thick liquid Liquids provided via: Cup;Straw Medication Administration: Whole meds with puree Supervision: Patient able to self feed;Full supervision/cueing for compensatory strategies Compensations: Minimize environmental distractions;Slow rate;Small sips/bites;Other (Comment);Lingual sweep for clearance of pocketing (Use pure to help transit solids that are masticated) Postural Changes and/or Swallow Maneuvers: Seated upright 90 degrees;Upright 30-60 min after meal                  Oral care BID     Dysphagia, unspecified (R13.10)     Continue with current plan of care    Rolena Infante, MS [facility] SLP Acute Rehab Services Office [phone]  Chales Abrahams  08/01/2023, 11:10 AM

## 2023-08-01 NOTE — Progress Notes (Addendum)
PHARMACY - PHYSICIAN COMMUNICATION CRITICAL VALUE ALERT - BLOOD CULTURE IDENTIFICATION (BCID)  Douglas Walker is an 89 y.o. male who presented to [facility] on 07/30/2023 with a chief complaint of fever  Assessment:  BCID: 1 of 4 bottles, anaerobic, GNR, no ID on BCID. ID changed meropenem/azith to [facility] today for Asp PNA  Name of physician (or Provider) Contacted: Jolene Schimke, NP via Secure Chat & Dr. Netta Corrigan via Secure chat  Current antibiotics: Unasyn 3 gm IV q12h  Changes to prescribed antibiotics recommended: per Dr Thedore Mins, continue Unasyn, it could be a contaminant.   Results for orders placed or performed during the hospital encounter of 07/30/23  Blood Culture ID Panel (Reflexed) (Collected: 07/30/2023  2:47 AM)  Result Value Ref Range   Enterococcus faecalis NOT DETECTED NOT DETECTED   Enterococcus Faecium NOT DETECTED NOT DETECTED   Listeria monocytogenes NOT DETECTED NOT DETECTED   Staphylococcus species NOT DETECTED NOT DETECTED   Staphylococcus aureus (BCID) NOT DETECTED NOT DETECTED   Staphylococcus epidermidis NOT DETECTED NOT DETECTED   Staphylococcus lugdunensis NOT DETECTED NOT DETECTED   Streptococcus species NOT DETECTED NOT DETECTED   Streptococcus agalactiae NOT DETECTED NOT DETECTED   Streptococcus pneumoniae NOT DETECTED NOT DETECTED   Streptococcus pyogenes NOT DETECTED NOT DETECTED   A.calcoaceticus-baumannii NOT DETECTED NOT DETECTED   Bacteroides fragilis NOT DETECTED NOT DETECTED   Enterobacterales NOT DETECTED NOT DETECTED   Enterobacter cloacae complex NOT DETECTED NOT DETECTED   Escherichia coli NOT DETECTED NOT DETECTED   Klebsiella aerogenes NOT DETECTED NOT DETECTED   Klebsiella oxytoca NOT DETECTED NOT DETECTED   Klebsiella pneumoniae NOT DETECTED NOT DETECTED   Proteus species NOT DETECTED NOT DETECTED   Salmonella species NOT DETECTED NOT DETECTED   Serratia marcescens NOT DETECTED NOT DETECTED   Haemophilus influenzae NOT DETECTED  NOT DETECTED   Neisseria meningitidis NOT DETECTED NOT DETECTED   Pseudomonas aeruginosa NOT DETECTED NOT DETECTED   Stenotrophomonas maltophilia NOT DETECTED NOT DETECTED   Candida albicans NOT DETECTED NOT DETECTED   Candida auris NOT DETECTED NOT DETECTED   Candida glabrata NOT DETECTED NOT DETECTED   Candida krusei NOT DETECTED NOT DETECTED   Candida parapsilosis NOT DETECTED NOT DETECTED   Candida tropicalis NOT DETECTED NOT DETECTED   Cryptococcus neoformans/gattii NOT DETECTED NOT DETECTED    Herby Abraham, Pharm.D Use secure chat for questions 08/01/2023 8:36 PM

## 2023-08-01 NOTE — Progress Notes (Signed)
ANTICOAGULATION CONSULT NOTE - follow up  Pharmacy Consult for Heparin Indication: atrial fibrillation, PTA Eliquis held  Allergies  Allergen Reactions   Sulfa Antibiotics Other (See Comments)    Weakness Dizziness  Sweats    Mysoline [Primidone] Other (See Comments)    Sedation    Zocor [Simvastatin] Other (See Comments)    Arthralgias Fatigue    Patient Measurements: Height: 5\' 9"  (175.3 cm) (Per son) Weight: 91.4 kg (201 lb 8 oz) IBW/kg (Calculated) : 70.7  Vital Signs: Temp: 98.1 F (36.7 C) (08/02 1146) Temp Source: Oral (08/02 1146) BP: 135/52 (08/02 1200) Pulse Rate: 63 (08/02 1200)  Labs: Recent Labs    07/29/23 1445 07/29/23 1445 07/30/23 0247 07/31/23 0312 07/31/23 1853 08/01/23 0319 08/01/23 1245  HGB 11.0*  --  11.4* 10.0*  --  10.8*  --   HCT 33.7*  --  35.6* 30.9*  --  32.9*  --   PLT 102*  --  116* 81*  --  84*  --   APTT  --    < > 27  --  79* 52* 54*  LABPROT  --   --  15.3*  --   --   --   --   INR  --   --  1.2  --   --   --   --   HEPARINUNFRC  --   --   --   --  >1.10* 1.10*  --   CREATININE 1.76*  --  1.82* 2.05*  --   --   --    < > = values in this interval not displayed.    Estimated Creatinine Clearance: 24.1 mL/min (A) (by C-G formula based on SCr of 2.05 mg/dL (H)).   Medications:  Scheduled:   Chlorhexidine Gluconate Cloth  6 each Topical Daily   DULoxetine  60 mg Oral Daily   hydrocortisone sod succinate (SOLU-CORTEF) inj  100 mg Intravenous Q12H   levothyroxine  25 mcg Oral QAC breakfast   loratadine  10 mg Oral Daily   pregabalin  50 mg Oral QHS   Infusions:   sodium chloride Stopped (07/30/23 2325)   ampicillin-sulbactam (UNASYN) IV     heparin 1,300 Units/hr (08/01/23 0802)    Assessment: 89 yoM admitted on 7/31 with sepsis.  PMH significant for afib on prior to admission Eliquis.  Pharmacy is consulted to dose Heparin while he is now NPO.    PTA apixaban initially resumed on admission - last dose given 7/31 at  10am.  Expect falsely elevated heparin level due to recent DOAC use.     Today, 08/01/2023: Heparin level remains elevated d/t recent DOAC use aPTT still SUBtherapeutic after rate increased from 1200 to 1300 units/hr Hgb and Plt both low but stable No bleeding or infusion issues reported SCr elevated and rising  Goal of Therapy:  Heparin level 0.3-0.7 units/ml aPTT 66-102 seconds Monitor platelets by anticoagulation protocol: Yes  Plan:  Increase heparin IV infusion from 1300 units/hr to 1500 units/hr Recheck aPTT 8 hours after rate increase Daily CBC and heparin level; aPTT as needed until DOAC effects have dissipated F/u for ability to resume DOAC   Thank you for allowing pharmacy to be a part of this patient's care.  Douglas Walker, PharmD, BCPS Secure Chat if ?s 08/01/2023 1:04 PM

## 2023-08-01 NOTE — Progress Notes (Signed)
PROGRESS NOTE    Douglas Walker  ZHY:[id number] DOB: December 02, 1925 DOA: 07/30/2023 PCP: Joya Martyr, MD   Brief Narrative: This 89 years old male with PMH significant for dementia, chronic diastolic heart failure, s/p pacemaker, essential tremor, hyperlipidemia, paroxysmal A-fib on Eliquis, pulmonary hypertension presented in the ED with fever, confusion, generalized weakness and shaking.  Patient also found to have productive cough and hypoxia requiring supplemental oxygen.  Patient was also hypotensive requiring Levophed support.  Patient was admitted in the ICU for sepsis secondary to community-acquired pneumonia  and recurrent UTI.  He was started on empiric antibiotics. TRH pickup 08/01/2023.  Assessment & Plan:   Principal Problem:   Sepsis (HCC) Active Problems:   Septic shock (HCC)  Septic shock sec. to community-acquired pneumonia /recurrent UTI: Patient does have history of multidrug-resistant UTI on chronic Macrobid therapy. Patient was hypotensive requiring Levophed support to keep MAP goal above 65. Lactic acid resolved with IV hydration.   Patient was initiated on meropenem and azithromycin as per ID. Blood pressure improved, Now off Levophed support. Consider weaning stress dose steroids. Discontinue midodrine as blood pressure is improved. Infectious disease recommended to de-escalate antibiotics to Unasyn.   Meropenem and azithromycin discontinued. Patient can be discharged on Augmentin to complete 7-day antibiotic for possible aspiration pneumonia. Sepsis physiology improving. Follow-up Legionella antigen,   Acute on chronic kidney disease stage IIIa. Baseline Serum creatinine 1.2- 1.3  slightly increased 2.08 Continue IV fluid resuscitation.  Avoid nephrotoxic medications. Monitor serum creatinine.  Acute encephalopathy secondary to sepsis: Mental status has been improving. Patient does have history of dementia. As per son patient at baseline can speak  but not talkative from prior stroke.   Obtain CT head to r/o acute changes. Speech and swallow eval.  Reorient as able to  Atrial fibrillation: Heart rate is well-controlled.   Patient is started on heparin drip. Will resume Eliquis if no intervention planned.  Hypothyroidism: continue levothyroxine.  Chronic pain: Continue Cymbalta and Lyrica.  Dementia: No behavioral problem.  Continue to monitor.   DVT prophylaxis: Heparin IV Code Status: DNR Family Communication: No family at bed side. Disposition Plan:  Status is: Inpatient Remains inpatient appropriate because: Admitted for septic shock secondary to community-acquired pneumonia and UTI.  Started on empiric antibiotics    Consultants:  Infectious diseases  Procedures: None Antimicrobials: Anti-infectives (From admission, onward)    Start     Dose/Rate Route Frequency Ordered Stop   08/01/23 2100  Ampicillin-Sulbactam (UNASYN) 3 g in sodium chloride 0.9 % 100 mL IVPB        3 g 200 mL/hr over 30 Minutes Intravenous Every 12 hours 08/01/23 1105     07/31/23 0400  cefTRIAXone (ROCEPHIN) 1 g in sodium chloride 0.9 % 100 mL IVPB  Status:  Discontinued        1 g 200 mL/hr over 30 Minutes Intravenous Every 24 hours 07/30/23 0736 07/30/23 0758   07/30/23 1045  ceFEPIme (MAXIPIME) 2 g in sodium chloride 0.9 % 100 mL IVPB  Status:  Discontinued        2 g 200 mL/hr over 30 Minutes Intravenous  Once 07/30/23 1030 07/30/23 1032   07/30/23 1045  metroNIDAZOLE (FLAGYL) IVPB 500 mg  Status:  Discontinued        500 mg 100 mL/hr over 60 Minutes Intravenous  Once 07/30/23 1030 07/30/23 1055   07/30/23 0900  meropenem (MERREM) 500 mg in sodium chloride 0.9 % 100 mL IVPB  Status:  Discontinued  500 mg 200 mL/hr over 30 Minutes Intravenous Every 12 hours 07/30/23 0759 08/01/23 1105   07/30/23 0800  azithromycin (ZITHROMAX) 500 mg in sodium chloride 0.9 % 250 mL IVPB  Status:  Discontinued        500 mg 250 mL/hr over 60  Minutes Intravenous Every 24 hours 07/30/23 0736 08/01/23 1105   07/30/23 0300  vancomycin (VANCOREADY) IVPB 1750 mg/350 mL        1,750 mg 175 mL/hr over 120 Minutes Intravenous  Once 07/30/23 0247 07/30/23 0617   07/30/23 0245  ceFEPIme (MAXIPIME) 2 g in sodium chloride 0.9 % 100 mL IVPB        2 g 200 mL/hr over 30 Minutes Intravenous  Once 07/30/23 0244 07/30/23 0442   07/30/23 0245  metroNIDAZOLE (FLAGYL) IVPB 500 mg        500 mg 100 mL/hr over 60 Minutes Intravenous  Once 07/30/23 0244 07/30/23 0715   07/30/23 0245  vancomycin (VANCOCIN) IVPB 1000 mg/200 mL premix  Status:  Discontinued        1,000 mg 200 mL/hr over 60 Minutes Intravenous  Once 07/30/23 0244 07/30/23 0247      Subjective: Patient was seen and examined at bedside. Overnight events noted. Patient is demented,  seems back to his baseline mental status.  He is following commands. He is oriented to self only.  Appears very deconditioned.   Objective: Vitals:   08/01/23 1000 08/01/23 1100 08/01/23 1146 08/01/23 1200  BP: (!) 140/65 132/72  (!) 135/52  Pulse: 69 60  63  Resp: 20 19  (!) 23  Temp:   98.1 F (36.7 C)   TempSrc:   Oral   SpO2: 100% 100%  100%  Weight:      Height:        Intake/Output Summary (Last 24 hours) at 08/01/2023 1333 Last data filed at 08/01/2023 0600 Gross per 24 hour  Intake 1013.16 ml  Output 975 ml  Net 38.16 ml   Filed Weights   07/30/23 1156 07/31/23 0324 08/01/23 0500  Weight: 86.7 kg 87.5 kg 91.4 kg    Examination:  General exam: Appears deconditioned, not in any acute distress.  Appears comfortable. Respiratory system: Clear to auscultation. Respiratory effort normal.  RR 15 Cardiovascular system: S1 & S2 heard, irregular rhythm, no murmur. Gastrointestinal system: Abdomen is soft, non tender, non distended, bowel sounds present. Central nervous system: Alert and oriented x 1. No focal neurological deficits. Extremities: No edema, no cyanosis, no clubbing Skin: No  rashes, lesions or ulcers Psychiatry:Mood & affect appropriate.     Data Reviewed: I have personally reviewed following labs and imaging studies  CBC: Recent Labs  Lab 07/29/23 1445 07/30/23 0247 07/31/23 0312 08/01/23 0319  WBC 9.4 9.6 24.7* 23.0*  NEUTROABS  --  9.2*  --   --   HGB 11.0* 11.4* 10.0* 10.8*  HCT 33.7* 35.6* 30.9* 32.9*  MCV 93.6 93.7 94.5 94.3  PLT 102* 116* 81* 84*   Basic Metabolic Panel: Recent Labs  Lab 07/29/23 1445 07/30/23 0247 07/31/23 0312  NA 140 135 138  K 3.8 3.4* 3.9  CL 100 98 104  CO2 32 26 24  GLUCOSE 94 89 115*  BUN 31* 34* 41*  CREATININE 1.76* 1.82* 2.05*  CALCIUM 9.5 9.4 8.5*  MG  --   --  2.1  PHOS  --   --  4.2   GFR: Estimated Creatinine Clearance: 24.1 mL/min (A) (by C-G formula based on SCr of  2.05 mg/dL (H)). Liver Function Tests: Recent Labs  Lab 07/29/23 1445 07/30/23 0247 07/31/23 0312  AST 20 30 24   ALT 19 28 21   ALKPHOS  --  97 67  BILITOT 0.5 0.7 0.9  PROT 7.6 8.4* 6.8  ALBUMIN  --  3.5 2.6*   No results for input(s): "LIPASE", "AMYLASE" in the last 168 hours. Recent Labs  Lab 07/30/23 1204  AMMONIA 12   Coagulation Profile: Recent Labs  Lab 07/30/23 0247  INR 1.2   Cardiac Enzymes: No results for input(s): "CKTOTAL", "CKMB", "CKMBINDEX", "TROPONINI" in the last 168 hours. BNP (last 3 results) Recent Labs    02/04/23 1619  PROBNP 1,402*   HbA1C: No results for input(s): "HGBA1C" in the last 72 hours. CBG: No results for input(s): "GLUCAP" in the last 168 hours. Lipid Profile: No results for input(s): "CHOL", "HDL", "LDLCALC", "TRIG", "CHOLHDL", "LDLDIRECT" in the last 72 hours. Thyroid Function Tests: Recent Labs    07/30/23 1204  TSH 3.068   Anemia Panel: No results for input(s): "VITAMINB12", "FOLATE", "FERRITIN", "TIBC", "IRON", "RETICCTPCT" in the last 72 hours. Sepsis Labs: Recent Labs  Lab 07/30/23 0302 07/30/23 0457 07/30/23 1150  LATICACIDVEN 2.4* 2.3* 1.7    Recent  Results (from the past 240 hour(s))  Blood Culture (routine x 2)     Status: None (Preliminary result)   Collection Time: 07/30/23  2:47 AM   Specimen: BLOOD  Result Value Ref Range Status   Specimen Description   Final    BLOOD RIGHT ANTECUBITAL Performed at [facility], [address], [place], [place] [address]    Special Requests   Final    BOTTLES DRAWN AEROBIC AND ANAEROBIC Blood Culture results may not be optimal due to an excessive volume of blood received in culture bottles Performed at [facility], [address], [place], [place] [address]    Culture   Final    NO GROWTH 2 DAYS Performed at [facility], [address], [place], [place] [address]    Report Status PENDING  Incomplete  Resp panel by RT-PCR (RSV, Flu A&B, Covid) Anterior Nasal Swab     Status: None   Collection Time: 07/30/23  2:54 AM   Specimen: Anterior Nasal Swab  Result Value Ref Range Status   SARS Coronavirus 2 by RT PCR NEGATIVE NEGATIVE Final    Comment: (NOTE) SARS-CoV-2 target nucleic acids are NOT DETECTED.  The SARS-CoV-2 RNA is generally detectable in upper respiratory specimens during the acute phase of infection. The lowest concentration of SARS-CoV-2 viral copies this assay can detect is 138 copies/mL. A negative result does not preclude SARS-Cov-2 infection and should not be used as the sole basis for treatment or other patient management decisions. A negative result may occur with  improper specimen collection/handling, submission of specimen other than nasopharyngeal swab, presence of viral mutation(s) within the areas targeted by this assay, and inadequate number of viral copies(<138 copies/mL). A negative result must be combined with clinical observations, patient history, and epidemiological information. The expected result is Negative.  Fact Sheet for Patients:  BloggerCourse.com  Fact Sheet for Healthcare  Providers:  SeriousBroker.it  This test is no t yet approved or cleared by the [place] FDA and  has been authorized for detection and/or diagnosis of SARS-CoV-2 by FDA under an Emergency Use Authorization (EUA). This EUA will remain  in effect (meaning this test can be used) for the duration of the COVID-19 declaration under Section 564(b)(1)  of the Act, 21 U.S.C.section 360bbb-3(b)(1), unless the authorization is terminated  or revoked sooner.       Influenza A by PCR NEGATIVE NEGATIVE Final   Influenza B by PCR NEGATIVE NEGATIVE Final    Comment: (NOTE) The Xpert Xpress SARS-CoV-2/FLU/RSV plus assay is intended as an aid in the diagnosis of influenza from Nasopharyngeal swab specimens and should not be used as a sole basis for treatment. Nasal washings and aspirates are unacceptable for Xpert Xpress SARS-CoV-2/FLU/RSV testing.  Fact Sheet for Patients: BloggerCourse.com  Fact Sheet for Healthcare Providers: SeriousBroker.it  This test is not yet approved or cleared by the [place] FDA and has been authorized for detection and/or diagnosis of SARS-CoV-2 by FDA under an Emergency Use Authorization (EUA). This EUA will remain in effect (meaning this test can be used) for the duration of the COVID-19 declaration under Section 564(b)(1) of the Act, 21 U.S.C. section 360bbb-3(b)(1), unless the authorization is terminated or revoked.     Resp Syncytial Virus by PCR NEGATIVE NEGATIVE Final    Comment: (NOTE) Fact Sheet for Patients: BloggerCourse.com  Fact Sheet for Healthcare Providers: SeriousBroker.it  This test is not yet approved or cleared by the [place] FDA and has been authorized for detection and/or diagnosis of SARS-CoV-2 by FDA under an Emergency Use Authorization (EUA). This EUA will remain in effect (meaning this test can be  used) for the duration of the COVID-19 declaration under Section 564(b)(1) of the Act, 21 U.S.C. section 360bbb-3(b)(1), unless the authorization is terminated or revoked.  Performed at [facility], [address] [place], [place] [address]   Blood Culture (routine x 2)     Status: None (Preliminary result)   Collection Time: 07/30/23  3:37 AM   Specimen: BLOOD  Result Value Ref Range Status   Specimen Description   Final    BLOOD BLOOD RIGHT FOREARM Performed at [facility], [address] [place], [place] [address]    Special Requests   Final    BOTTLES DRAWN AEROBIC AND ANAEROBIC Blood Culture results may not be optimal due to an excessive volume of blood received in culture bottles Performed at [facility], [address] [place], [place] [address]    Culture   Final    NO GROWTH 2 DAYS Performed at [facility], [address], [place], [place] [address]    Report Status PENDING  Incomplete  Urine Culture     Status: Abnormal   Collection Time: 07/30/23  4:17 AM   Specimen: Urine, Random  Result Value Ref Range Status   Specimen Description   Final    URINE, RANDOM Performed at [facility], [address], [place], [place] [address]    Special Requests   Final    NONE Reflexed from [id number] Performed at [facility], [address] [place], [place] [address]    Culture >=100,000 COLONIES/mL PROTEUS MIRABILIS (A)  Final   Report Status 08/01/2023 FINAL  Final   Organism ID, Bacteria PROTEUS MIRABILIS (A)  Final      Susceptibility   Proteus mirabilis - MIC*    AMPICILLIN <=2 SENSITIVE Sensitive     CEFAZOLIN >=64 RESISTANT Resistant     CEFEPIME <=0.12 SENSITIVE Sensitive     CEFTRIAXONE 32 RESISTANT Resistant     CIPROFLOXACIN <=0.25 SENSITIVE Sensitive     GENTAMICIN <=1 SENSITIVE Sensitive     IMIPENEM 1 SENSITIVE Sensitive  NITROFURANTOIN 64  INTERMEDIATE Intermediate     TRIMETH/SULFA <=20 SENSITIVE Sensitive     AMPICILLIN/SULBACTAM <=2 SENSITIVE Sensitive     PIP/TAZO <=4 SENSITIVE Sensitive     * >=100,000 COLONIES/mL PROTEUS MIRABILIS  MRSA Next Gen by PCR, Nasal     Status: None   Collection Time: 07/30/23  5:12 AM   Specimen: Nasal Mucosa; Nasal Swab  Result Value Ref Range Status   MRSA by PCR Next Gen NOT DETECTED NOT DETECTED Final    Comment: (NOTE) The GeneXpert MRSA Assay (FDA approved for NASAL specimens only), is one component of a comprehensive MRSA colonization surveillance program. It is not intended to diagnose MRSA infection nor to guide or monitor treatment for MRSA infections. Test performance is not FDA approved in patients less than 83 years old. Performed at [facility], [address] [place], [place] [address]     Radiology Studies: CT HEAD WO CONTRAST ( )  Result Date: 07/31/2023 CLINICAL DATA:  Altered mental status EXAM: CT HEAD WITHOUT CONTRAST TECHNIQUE: Contiguous axial images were obtained from the base of the skull through the vertex without intravenous contrast. RADIATION DOSE REDUCTION: This exam was performed according to the departmental dose-optimization program which includes automated exposure control, adjustment of the mA and/or kV according to patient size and/or use of iterative reconstruction technique. COMPARISON:  01/02/2023 FINDINGS: Brain: No evidence of acute infarction, hemorrhage, hydrocephalus, extra-axial collection or mass lesion/mass effect. Periventricular and deep white matter hypodensity. Unchanged left frontal encephalomalacia. Vascular: No hyperdense vessel or unexpected calcification. Skull: Normal. Negative for fracture or focal lesion. Sinuses/Orbits: No acute finding.  Right ocular prosthesis. Other: None. IMPRESSION: No acute intracranial pathology. Small-vessel white matter disease and unchanged left frontal encephalomalacia. Electronically  Signed   By: Jearld Lesch M.D.   On: 07/31/2023 10:33    Scheduled Meds:  Chlorhexidine Gluconate Cloth  6 each Topical Daily   DULoxetine  60 mg Oral Daily   hydrocortisone sod succinate (SOLU-CORTEF) inj  100 mg Intravenous Q12H   levothyroxine  25 mcg Oral QAC breakfast   loratadine  10 mg Oral Daily   pregabalin  50 mg Oral QHS   Continuous Infusions:  sodium chloride Stopped (07/30/23 2325)   ampicillin-sulbactam (UNASYN) IV     heparin       LOS: 2 days    Time spent: 50 mins    Willeen Niece, MD [facility]   If 7PM-7AM, please contact night-coverage

## 2023-08-01 NOTE — Progress Notes (Addendum)
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  UA showed negative nitrites and large leukocytes - Currently on meropenem and azithromycin. I reviewed [name] note from 7/30, he was appeared to be stable at that point.  As such I suspect his symptoms are secondary to aspiration PNA. He is doing much better today.    Recommendations:  - Discontinue meropenem, completed 3 days of azithromycin for possible CAP. - Transition to Unasyn can discharged on Augmentin to complete 7 days antibiotics for possible aspiration pneumonia(EOT 08/05/2023).  Patient has been afebrile x 48 hours. - Urine Cx + Proteus mirabilis, this is of unclear clinical significance as it was obtained from old Foley that was inserted on 7/4.  Patient underwent Foley exchange during this hospitalization.  Follow-up for Legionella antigen -Infectious disease will sign off Microbiology:   Antibiotics: Vancomycin, cefepime, metronidazole 7/30- Azithromycn , merrem 7/31-   Cultures: Blood 7/31 2/2 NG Urine 7/31 pending Other    SUBJECTIVE: Resting in bed. Able to answer some questions.  Interval: Afebrile overnight. Wbc 24.7k  Review of Systems: Review of Systems  All other systems reviewed and are negative.    Scheduled Meds:  Chlorhexidine Gluconate Cloth  6 each Topical Daily   DULoxetine  60 mg Oral Daily   hydrocortisone sod  succinate (SOLU-CORTEF) inj  100 mg Intravenous Q12H   levothyroxine  25 mcg Oral QAC breakfast   loratadine  10 mg Oral Daily   pregabalin  50 mg Oral QHS   Continuous Infusions:  sodium chloride Stopped (07/30/23 2325)   ampicillin-sulbactam (UNASYN) IV     heparin 1,300 Units/hr (08/01/23 0802)   PRN Meds:.acetaminophen **OR** acetaminophen, albuterol, docusate sodium, hydrALAZINE, ondansetron **OR** ondansetron (ZOFRAN) IV, mouth rinse, polyethylene glycol, senna-docusate, traMADol Allergies  Allergen Reactions   Sulfa Antibiotics Other (See Comments)    Weakness Dizziness  Sweats    Mysoline [Primidone] Other (See Comments)    Sedation    Zocor [Simvastatin] Other (See Comments)    Arthralgias Fatigue    OBJECTIVE: Vitals:   08/01/23 0700 08/01/23 0800 08/01/23 0900 08/01/23 0908  BP: (!) 154/75 (!) 142/70 (!) 155/79   Pulse: 69 70 69   Resp: 17 16 16    Temp:    98.4 F (36.9 C)  TempSrc:    Oral  SpO2: 100% 100% 100%   Weight:      Height:       Body mass index is 29.76 kg/m.  Physical Exam Constitutional:      General: He is not in acute distress.    Appearance: He is normal weight. He is not toxic-appearing.  HENT:     Head: Normocephalic and atraumatic.     Right Ear: External ear normal.     Left Ear: External ear normal.     Nose: No congestion or rhinorrhea.     Mouth/Throat:     Mouth: Mucous membranes are moist.  Pharynx: Oropharynx is clear.  Eyes:     Extraocular Movements: Extraocular movements intact.     Conjunctiva/sclera: Conjunctivae normal.     Pupils: Pupils are equal, round, and reactive to light.  Cardiovascular:     Rate and Rhythm: Normal rate and regular rhythm.     Heart sounds: No murmur heard.    No friction rub. No gallop.  Pulmonary:     Effort: Pulmonary effort is normal.     Breath sounds: Normal breath sounds.  Abdominal:     General: Abdomen is flat. Bowel sounds are normal.     Palpations: Abdomen is soft.   Musculoskeletal:        General: No swelling.     Cervical back: Normal range of motion and neck supple.  Skin:    General: Skin is warm and dry.  Neurological:     General: No focal deficit present.     Mental Status: He is oriented to person, place, and time.  Psychiatric:        Mood and Affect: Mood normal.       Lab Results Lab Results  Component Value Date   WBC 23.0 (H) 08/01/2023   HGB 10.8 (L) 08/01/2023   HCT 32.9 (L) 08/01/2023   MCV 94.3 08/01/2023   PLT 84 (L) 08/01/2023    Lab Results  Component Value Date   CREATININE 2.05 (H) 07/31/2023   BUN 41 (H) 07/31/2023   NA 138 07/31/2023   K 3.9 07/31/2023   CL 104 07/31/2023   CO2 24 07/31/2023    Lab Results  Component Value Date   ALT 21 07/31/2023   AST 24 07/31/2023   ALKPHOS 67 07/31/2023   BILITOT 0.9 07/31/2023        [name], MD [facility] 08/01/2023, 11:38 AM   I have personally spent involved in face-to-face and non-face-to-face activities for this patient on the day of the visit. Professional time spent includes the following activities: Preparing to see the patient (review of tests), Obtaining and/or reviewing separately obtained history (admission/discharge record), Performing a medically appropriate examination and/or evaluation , Ordering medications/tests/procedures, referring and communicating with other health care professionals, Documenting clinical information in the EMR, Independently interpreting results (not separately reported), Communicating results to the patient/family/caregiver, Counseling and educating the patient/family/caregiver and Care coordination (not separately reported).

## 2023-08-01 NOTE — Plan of Care (Signed)
  Problem: Clinical Measurements: Goal: Ability to maintain clinical measurements within normal limits will improve Outcome: Progressing Goal: Diagnostic test results will improve Outcome: Progressing Goal: Respiratory complications will improve Outcome: Progressing   Problem: Education: Goal: Knowledge of General Education information will improve Description: Including pain rating scale, medication(s)/side effects and non-pharmacologic comfort measures Outcome: Not Progressing   Problem: Health Behavior/Discharge Planning: Goal: Ability to manage health-related needs will improve Outcome: Not Progressing

## 2023-08-01 NOTE — Progress Notes (Signed)
eLink Physician-Brief Progress Note Patient Name: RITHWIK SCHMIEG DOB: 09-14-1925 MRN: [id number]   Date of Service  08/01/2023  HPI/Events of Note  Hypertension . Current BP 167/84. No distress. History of HTN but not on meds. Was on pressors and midrodrine so do not want to be aggressive with BP control since with no symptoms. HR is 70 on camera. Laying in bed  eICU Interventions  Wrote for PRN hydralazine if BP > 170.      Intervention Category Major Interventions: Hypertension - evaluation and management   G  08/01/2023, 1:17 AM

## 2023-08-01 NOTE — Progress Notes (Addendum)
ANTICOAGULATION CONSULT NOTE - Initial Consult  Pharmacy Consult for Heparin Indication: atrial fibrillation, PTA Eliquis held  Allergies  Allergen Reactions   Sulfa Antibiotics Other (See Comments)    Weakness Dizziness  Sweats    Mysoline [Primidone] Other (See Comments)    Sedation    Zocor [Simvastatin] Other (See Comments)    Arthralgias Fatigue    Patient Measurements: Height: 5\' 9"  (175.3 cm) (Per son) Weight: 87.5 kg (192 lb 14.4 oz) IBW/kg (Calculated) : 70.7  Vital Signs: Temp: 98.1 F (36.7 C) (08/02 0343) Temp Source: Oral (08/02 0343) BP: 162/82 (08/02 0200) Pulse Rate: 69 (08/02 0200)  Labs: Recent Labs    07/29/23 1445 07/30/23 0247 07/31/23 0312 07/31/23 1853 08/01/23 0319  HGB 11.0* 11.4* 10.0*  --  10.8*  HCT 33.7* 35.6* 30.9*  --  32.9*  PLT 102* 116* 81*  --  84*  APTT  --  27  --  79* 52*  LABPROT  --  15.3*  --   --   --   INR  --  1.2  --   --   --   HEPARINUNFRC  --   --   --  >1.10* 1.10*  CREATININE 1.76* 1.82* 2.05*  --   --     Estimated Creatinine Clearance: 23.6 mL/min (A) (by C-G formula based on SCr of 2.05 mg/dL (H)).   Medications:  Scheduled:   Chlorhexidine Gluconate Cloth  6 each Topical Daily   DULoxetine  60 mg Oral Daily   hydrocortisone sod succinate (SOLU-CORTEF) inj  100 mg Intravenous Q12H   levothyroxine  25 mcg Oral QAC breakfast   loratadine  10 mg Oral Daily   pregabalin  50 mg Oral QHS   Infusions:   sodium chloride Stopped (07/30/23 2325)   azithromycin Stopped (07/31/23 0948)   heparin 1,200 Units/hr (08/01/23 0000)   meropenem (MERREM) IV Stopped (07/31/23 2133)   norepinephrine (LEVOPHED) Adult infusion Stopped (07/30/23 1848)    Assessment: 89 yoM admitted on 7/31 with sepsis.  PMH significant for afib on prior to admission Eliquis.  Pharmacy is consulted to dose Heparin while he is now NPO.    PTA apixaban initially resumed on admission - last dose given 7/31 at 10am.  Expect falsely  elevated heparin level due to recent DOAC use.     Today, 08/01/2023: Heparin level remains elevated d/t recent DOAC use aPTT now slightly subtherapeutic on 1200 units/hr; previously therapeutic on this same rate Hgb and Plt both low but stable No bleeding or infusion issues reported SCr elevated and rising  Goal of Therapy:  Heparin level 0.3-0.7 units/ml aPTT 66-102 seconds Monitor platelets by anticoagulation protocol: Yes  Plan:  Increase heparin IV infusion to 1300 units/hr Confirmatory 8 hour & aPTT Daily CBC and heparin level; aPTT as needed until DOAC effects have dissipated F/u for ability to resume DOAC   Thank you for allowing pharmacy to be a part of this patient's care.  Bernadene Person, PharmD, BCPS [phone] 08/01/2023, 4:20 AM

## 2023-08-02 DIAGNOSIS — N179 Acute kidney failure, unspecified: Secondary | ICD-10-CM | POA: Diagnosis not present

## 2023-08-02 DIAGNOSIS — A419 Sepsis, unspecified organism: Secondary | ICD-10-CM | POA: Diagnosis not present

## 2023-08-02 DIAGNOSIS — R6520 Severe sepsis without septic shock: Secondary | ICD-10-CM | POA: Diagnosis not present

## 2023-08-02 LAB — HEPARIN LEVEL (UNFRACTIONATED): Heparin Unfractionated: 0.79 IU/mL — ABNORMAL HIGH (ref 0.30–0.70)

## 2023-08-02 LAB — APTT: aPTT: 90 seconds — ABNORMAL HIGH (ref 24–36)

## 2023-08-02 MED ORDER — POTASSIUM CHLORIDE CRYS ER 20 MEQ PO TBCR: 20.0000 meq | EXTENDED_RELEASE_TABLET | Freq: Once | ORAL | Status: DC

## 2023-08-02 MED ORDER — POTASSIUM CHLORIDE 20 MEQ PO PACK
40.0000 meq | PACK | Freq: Once | ORAL | Status: AC
  Administered 2023-08-02: 40 meq via ORAL
  Filled 2023-08-02: qty 2

## 2023-08-02 MED ORDER — HYDROCORTISONE SOD SUC (PF) 100 MG IJ SOLR
100.0000 mg | Freq: Every day | INTRAMUSCULAR | Status: DC
  Administered 2023-08-03 – 2023-08-04 (×2): 100 mg via INTRAVENOUS
  Filled 2023-08-02 (×2): qty 2

## 2023-08-02 NOTE — Plan of Care (Signed)
  Problem: Nutrition: Goal: Adequate nutrition will be maintained Outcome: Progressing   Problem: Coping: Goal: Level of anxiety will decrease Outcome: Progressing   Problem: Pain Managment: Goal: General experience of comfort will improve Outcome: Progressing   

## 2023-08-02 NOTE — Progress Notes (Signed)
ANTICOAGULATION CONSULT NOTE - follow up  Pharmacy Consult for Heparin Indication: atrial fibrillation, PTA Eliquis held  Allergies  Allergen Reactions   Sulfa Antibiotics Other (See Comments)    Weakness Dizziness  Sweats    Mysoline [Primidone] Other (See Comments)    Sedation    Zocor [Simvastatin] Other (See Comments)    Arthralgias Fatigue    Patient Measurements: Height: 5\' 9"  (175.3 cm) (Per son) Weight: 91.4 kg (201 lb 8 oz) IBW/kg (Calculated) : 70.7  Vital Signs: Temp: 97.8 F (36.6 C) (08/02 2346) Temp Source: Oral (08/02 2346) BP: 162/74 (08/03 0300) Pulse Rate: 69 (08/03 0300)  Labs: Recent Labs    07/31/23 0312 07/31/23 0312 07/31/23 1853 08/01/23 0319 08/01/23 1245 08/02/23 0053  HGB 10.0*  --   --  10.8*  --  10.3*  HCT 30.9*  --   --  32.9*  --  31.9*  PLT 81*  --   --  84*  --  83*  APTT  --    < > 79* 52* 54* 100*  HEPARINUNFRC  --   --  >1.10* 1.10*  --   --   CREATININE 2.05*  --   --   --   --  1.63*   < > = values in this interval not displayed.    Estimated Creatinine Clearance: 30.3 mL/min (A) (by C-G formula based on SCr of 1.63 mg/dL (H)).   Medications:  Scheduled:   Chlorhexidine Gluconate Cloth  6 each Topical Daily   DULoxetine  60 mg Oral Daily   hydrocortisone sod succinate (SOLU-CORTEF) inj  100 mg Intravenous Q12H   levothyroxine  25 mcg Oral QAC breakfast   loratadine  10 mg Oral Daily   pregabalin  50 mg Oral QHS   Infusions:   sodium chloride Stopped (07/30/23 2325)   ampicillin-sulbactam (UNASYN) IV Stopped (08/01/23 2053)   heparin 1,500 Units/hr (08/02/23 0224)    Assessment: 89 yoM admitted on 7/31 with sepsis.  PMH significant for afib on prior to admission Eliquis.  Pharmacy is consulted to dose Heparin while he is now NPO.    PTA apixaban initially resumed on admission - last dose given 7/31 at 10am.  Expect falsely elevated heparin level due to recent DOAC use.     Today, 08/02/2023: Heparin level  remains elevated d/t recent DOAC use aPTT= 100 sec (therapeutic) after rate increased to 1500 units/hr units/hr Hgb and Plt both low but stable No bleeding or infusion issues reported SCr elevated but decreased to 1.63  Goal of Therapy:  Heparin level 0.3-0.7 units/ml aPTT 66-102 seconds Monitor platelets by anticoagulation protocol: Yes  Plan:  Continue heparin IV infusion @ 1500 units/hr Recheck aPTT in 8 hours to confirm therapeutic dose.  Will check heparin level also at this time as it was not ordered with AM labs today. Daily CBC and heparin level; aPTT as needed until DOAC effects have dissipated F/u for ability to resume DOAC   Thank you for allowing pharmacy to be a part of this patient's care.  Terrilee Files, PharmD 08/02/2023 3:46 AM

## 2023-08-02 NOTE — Progress Notes (Signed)
ANTICOAGULATION CONSULT NOTE - follow up  Pharmacy Consult for Heparin Indication: atrial fibrillation, PTA Eliquis held  Allergies  Allergen Reactions   Sulfa Antibiotics Other (See Comments)    Weakness Dizziness  Sweats    Mysoline [Primidone] Other (See Comments)    Sedation    Zocor [Simvastatin] Other (See Comments)    Arthralgias Fatigue    Patient Measurements: Height: 5\' 9"  (175.3 cm) (Per son) Weight: 92.3 kg (203 lb 7.8 oz) IBW/kg (Calculated) : 70.7  Vital Signs: Temp: 98.1 F (36.7 C) (08/03 0837) Temp Source: Oral (08/03 0837) BP: 153/65 (08/03 0800) Pulse Rate: 51 (08/03 0800)  Labs: Recent Labs    07/31/23 0312 07/31/23 0312 07/31/23 1853 08/01/23 0319 08/01/23 1245 08/02/23 0053 08/02/23 1018  HGB 10.0*  --   --  10.8*  --  10.3*  --   HCT 30.9*  --   --  32.9*  --  31.9*  --   PLT 81*  --   --  84*  --  83*  --   APTT  --    < > 79* 52* 54* 100* 90*  HEPARINUNFRC  --   --  >1.10* 1.10*  --   --  0.79*  CREATININE 2.05*  --   --   --   --  1.63*  --    < > = values in this interval not displayed.    Estimated Creatinine Clearance: 30.4 mL/min (A) (by C-G formula based on SCr of 1.63 mg/dL (H)).   Medications:  Scheduled:   Chlorhexidine Gluconate Cloth  6 each Topical Daily   DULoxetine  60 mg Oral Daily   [START ON 08/03/2023] hydrocortisone sod succinate (SOLU-CORTEF) inj  100 mg Intravenous Daily   levothyroxine  25 mcg Oral QAC breakfast   loratadine  10 mg Oral Daily   pregabalin  50 mg Oral QHS   Infusions:   sodium chloride Stopped (07/30/23 2325)   ampicillin-sulbactam (UNASYN) IV Stopped (08/02/23 1035)   heparin 1,500 Units/hr (08/02/23 1118)    Assessment: 89 yoM admitted on 7/31 with sepsis.  PMH significant for afib on prior to admission Eliquis.  Pharmacy is consulted to dose Heparin while he is now NPO.    PTA apixaban initially resumed on admission - last dose given 7/31 at 10am.  Expect falsely elevated heparin  level due to recent DOAC use.     Today, 08/02/2023: Heparin level remains elevated d/t recent DOAC use aPTT continues to be therapeutic x 2 now on current IV heparin rate of 1500 units/hr Hgb and Plt both low but stable No bleeding or infusion issues reported SCr elevated but decreased to 1.63  Goal of Therapy:  Heparin level 0.3-0.7 units/ml aPTT 66-102 seconds Monitor platelets by anticoagulation protocol: Yes  Plan:  Continue heparin IV infusion @ current rate of 1500 units/hr Daily CBC, heparin level and aPTT, aPTT will continues as needed until DOAC effects have dissipated F/u for ability to resume DOAC   Thank you for allowing pharmacy to be a part of this patient's care.  [name], PharmD, BCPS Secure Chat if ?s 08/02/2023 12:06 PM

## 2023-08-02 NOTE — Progress Notes (Signed)
PROGRESS NOTE    Douglas Walker  QVZ:[id number] DOB: 1925-09-03 DOA: 07/30/2023 PCP: Joya Martyr, MD   Brief Narrative: This 89 years old male with PMH significant for dementia, chronic diastolic heart failure, s/p pacemaker, essential tremor, hyperlipidemia, paroxysmal A-fib on Eliquis, pulmonary hypertension presented in the ED with fever, confusion, generalized weakness and shaking.  Patient also found to have productive cough and hypoxia requiring supplemental oxygen.  Patient was also hypotensive requiring Levophed support.  Patient was admitted in the ICU for sepsis secondary to community-acquired pneumonia  and recurrent UTI.  He was started on empiric antibiotics. TRH pickup 08/01/2023.  Assessment & Plan:   Principal Problem:   Sepsis (HCC) Active Problems:   Septic shock (HCC)  Septic shock sec. to community-acquired pneumonia /recurrent UTI: Patient does have history of multidrug-resistant UTI on chronic Macrobid therapy. Patient was hypotensive requiring Levophed support to keep MAP goal above 65. Lactic acid resolved with IV hydration.   Patient was initiated on meropenem and azithromycin as per ID. Blood pressure has improved, Now off Levophed support. Wean stress dose steroids. Discontinue midodrine as blood pressure has improved. Infectious disease recommended to de-escalate antibiotics to Unasyn.   Meropenem and azithromycin discontinued. Patient can be discharged on Augmentin to complete 7-day antibiotics for possible aspiration pneumonia. Sepsis physiology improving. Follow-up Legionella antigen,   Acute on chronic kidney disease stage IIIa: Baseline Serum creatinine 1.2- 1.3  slightly increased 2.08 Continue IV fluid resuscitation.  Avoid nephrotoxic medications. Serum creatinine improving.  Acute encephalopathy secondary to sepsis: Mental status has been improving. Patient does have history of dementia. As per son patient at baseline can speak but  not talkative from prior stroke.   Obtain CT head to r/o acute change If mental status changes. Speech and swallow eval.  Reorient as able to  Atrial fibrillation: Heart rate is well-controlled.   Patient is started on heparin drip. Will resume Eliquis if no intervention planned.  Hypothyroidism: continue levothyroxine.  Chronic pain: Continue Cymbalta and Lyrica.  Dementia: No behavioral problem.  Continue to monitor.   DVT prophylaxis: Heparin IV Code Status: DNR Family Communication: No family at bed side. Disposition Plan:  Status is: Inpatient Remains inpatient appropriate because: Admitted for septic shock secondary to community-acquired pneumonia and UTI.  Started on empiric antibiotics    Consultants:  Infectious diseases  Procedures: None Antimicrobials: Anti-infectives (From admission, onward)    Start     Dose/Rate Route Frequency Ordered Stop   08/01/23 2100  Ampicillin-Sulbactam (UNASYN) 3 g in sodium chloride 0.9 % 100 mL IVPB        3 g 200 mL/hr over 30 Minutes Intravenous Every 12 hours 08/01/23 1105 08/05/23 2359   07/31/23 0400  cefTRIAXone (ROCEPHIN) 1 g in sodium chloride 0.9 % 100 mL IVPB  Status:  Discontinued        1 g 200 mL/hr over 30 Minutes Intravenous Every 24 hours 07/30/23 0736 07/30/23 0758   07/30/23 1045  ceFEPIme (MAXIPIME) 2 g in sodium chloride 0.9 % 100 mL IVPB  Status:  Discontinued        2 g 200 mL/hr over 30 Minutes Intravenous  Once 07/30/23 1030 07/30/23 1032   07/30/23 1045  metroNIDAZOLE (FLAGYL) IVPB 500 mg  Status:  Discontinued        500 mg 100 mL/hr over 60 Minutes Intravenous  Once 07/30/23 1030 07/30/23 1055   07/30/23 0900  meropenem (MERREM) 500 mg in sodium chloride 0.9 % 100 mL IVPB  Status:  Discontinued        500 mg 200 mL/hr over 30 Minutes Intravenous Every 12 hours 07/30/23 0759 08/01/23 1105   07/30/23 0800  azithromycin (ZITHROMAX) 500 mg in sodium chloride 0.9 % 250 mL IVPB  Status:  Discontinued         500 mg 250 mL/hr over 60 Minutes Intravenous Every 24 hours 07/30/23 0736 08/01/23 1105   07/30/23 0300  vancomycin (VANCOREADY) IVPB 1750 mg/350 mL        1,750 mg 175 mL/hr over 120 Minutes Intravenous  Once 07/30/23 0247 07/30/23 0617   07/30/23 0245  ceFEPIme (MAXIPIME) 2 g in sodium chloride 0.9 % 100 mL IVPB        2 g 200 mL/hr over 30 Minutes Intravenous  Once 07/30/23 0244 07/30/23 0442   07/30/23 0245  metroNIDAZOLE (FLAGYL) IVPB 500 mg        500 mg 100 mL/hr over 60 Minutes Intravenous  Once 07/30/23 0244 07/30/23 0715   07/30/23 0245  vancomycin (VANCOCIN) IVPB 1000 mg/200 mL premix  Status:  Discontinued        1,000 mg 200 mL/hr over 60 Minutes Intravenous  Once 07/30/23 0244 07/30/23 0247      Subjective: Patient was seen and examined at bedside. Overnight events noted. Patient is demented, seems back to his baseline mental status.   He is following commands. He is hard of hearing. He is oriented to self only.  Appears very deconditioned.   Objective: Vitals:   08/02/23 0508 08/02/23 0600 08/02/23 0800 08/02/23 0837  BP: (!) 182/78 137/67 (!) 153/65   Pulse:  (!) 54 (!) 51   Resp:  19 17   Temp:    98.1 F (36.7 C)  TempSrc:    Oral  SpO2:  97% 100%   Weight:      Height:        Intake/Output Summary (Last 24 hours) at 08/02/2023 1038 Last data filed at 08/02/2023 0600 Gross per 24 hour  Intake 419.67 ml  Output 1100 ml  Net -680.33 ml   Filed Weights   07/31/23 0324 08/01/23 0500 08/02/23 0500  Weight: 87.5 kg 91.4 kg 92.3 kg    Examination:  General exam: Appears deconditioned, not in any acute distress.  Appears comfortable. Respiratory system: CTA bilaterally. Respiratory effort normal.  RR 14 Cardiovascular system: S1 & S2 heard, irregular rhythm, no murmur. Gastrointestinal system: Abdomen is soft, non tender, non distended, bowel sounds present. Central nervous system: Alert and oriented x 1. No focal neurological deficits. Extremities:  No edema, no cyanosis, no clubbing Skin: No rashes, lesions or ulcers Psychiatry:Mood & affect appropriate.     Data Reviewed: I have personally reviewed following labs and imaging studies  CBC: Recent Labs  Lab 07/29/23 1445 07/30/23 0247 07/31/23 0312 08/01/23 0319 08/02/23 0053  WBC 9.4 9.6 24.7* 23.0* 15.3*  NEUTROABS  --  9.2*  --   --   --   HGB 11.0* 11.4* 10.0* 10.8* 10.3*  HCT 33.7* 35.6* 30.9* 32.9* 31.9*  MCV 93.6 93.7 94.5 94.3 92.7  PLT 102* 116* 81* 84* 83*   Basic Metabolic Panel: Recent Labs  Lab 07/29/23 1445 07/30/23 0247 07/31/23 0312 08/02/23 0053  NA 140 135 138 137  K 3.8 3.4* 3.9 3.3*  CL 100 98 104 107  CO2 32 26 24 23   GLUCOSE 94 89 115* 131*  BUN 31* 34* 41* 44*  CREATININE 1.76* 1.82* 2.05* 1.63*  CALCIUM 9.5 9.4 8.5*  8.4*  MG  --   --  2.1 2.3  PHOS  --   --  4.2 3.1   GFR: Estimated Creatinine Clearance: 30.4 mL/min (A) (by C-G formula based on SCr of 1.63 mg/dL (H)). Liver Function Tests: Recent Labs  Lab 07/29/23 1445 07/30/23 0247 07/31/23 0312  AST 20 30 24   ALT 19 28 21   ALKPHOS  --  97 67  BILITOT 0.5 0.7 0.9  PROT 7.6 8.4* 6.8  ALBUMIN  --  3.5 2.6*   No results for input(s): "LIPASE", "AMYLASE" in the last 168 hours. Recent Labs  Lab 07/30/23 1204  AMMONIA 12   Coagulation Profile: Recent Labs  Lab 07/30/23 0247  INR 1.2   Cardiac Enzymes: No results for input(s): "CKTOTAL", "CKMB", "CKMBINDEX", "TROPONINI" in the last 168 hours. BNP (last 3 results) Recent Labs    02/04/23 1619  PROBNP 1,402*   HbA1C: No results for input(s): "HGBA1C" in the last 72 hours. CBG: No results for input(s): "GLUCAP" in the last 168 hours. Lipid Profile: No results for input(s): "CHOL", "HDL", "LDLCALC", "TRIG", "CHOLHDL", "LDLDIRECT" in the last 72 hours. Thyroid Function Tests: Recent Labs    07/30/23 1204  TSH 3.068   Anemia Panel: No results for input(s): "VITAMINB12", "FOLATE", "FERRITIN", "TIBC", "IRON",  "RETICCTPCT" in the last 72 hours. Sepsis Labs: Recent Labs  Lab 07/30/23 0302 07/30/23 0457 07/30/23 1150  LATICACIDVEN 2.4* 2.3* 1.7    Recent Results (from the past 240 hour(s))  Blood Culture (routine x 2)     Status: None (Preliminary result)   Collection Time: 07/30/23  2:47 AM   Specimen: BLOOD  Result Value Ref Range Status   Specimen Description   Final    BLOOD RIGHT ANTECUBITAL Performed at [facility], [address], [place], [place] [address]    Special Requests   Final    BOTTLES DRAWN AEROBIC AND ANAEROBIC Blood Culture results may not be optimal due to an excessive volume of blood received in culture bottles Performed at [facility], [address] [place], [place] [address]    Culture  Setup Time   Final    GRAM NEGATIVE RODS ANAEROBIC BOTTLE ONLY CRITICAL RESULT CALLED TO, READ BACK BY AND VERIFIED WITH: PHARMD MICHELLE BELL ON 08/01/23 @ 1946 BY DRT Performed at [facility], [address], [place], [place] [address]    Culture GRAM NEGATIVE RODS  Final   Report Status PENDING  Incomplete  Blood Culture ID Panel (Reflexed)     Status: None   Collection Time: 07/30/23  2:47 AM  Result Value Ref Range Status   Enterococcus faecalis NOT DETECTED NOT DETECTED Final   Enterococcus Faecium NOT DETECTED NOT DETECTED Final   Listeria monocytogenes NOT DETECTED NOT DETECTED Final   Staphylococcus species NOT DETECTED NOT DETECTED Final   Staphylococcus aureus (BCID) NOT DETECTED NOT DETECTED Final   Staphylococcus epidermidis NOT DETECTED NOT DETECTED Final   Staphylococcus lugdunensis NOT DETECTED NOT DETECTED Final   Streptococcus species NOT DETECTED NOT DETECTED Final   Streptococcus agalactiae NOT DETECTED NOT DETECTED Final   Streptococcus pneumoniae NOT DETECTED NOT DETECTED Final   Streptococcus pyogenes NOT DETECTED NOT DETECTED Final   A.calcoaceticus-baumannii NOT DETECTED NOT DETECTED Final    Bacteroides fragilis NOT DETECTED NOT DETECTED Final   Enterobacterales NOT DETECTED NOT DETECTED Final   Enterobacter cloacae complex NOT DETECTED NOT DETECTED Final   Escherichia coli NOT DETECTED NOT DETECTED Final   Klebsiella  aerogenes NOT DETECTED NOT DETECTED Final   Klebsiella oxytoca NOT DETECTED NOT DETECTED Final   Klebsiella pneumoniae NOT DETECTED NOT DETECTED Final   Proteus species NOT DETECTED NOT DETECTED Final   Salmonella species NOT DETECTED NOT DETECTED Final   Serratia marcescens NOT DETECTED NOT DETECTED Final   Haemophilus influenzae NOT DETECTED NOT DETECTED Final   Neisseria meningitidis NOT DETECTED NOT DETECTED Final   Pseudomonas aeruginosa NOT DETECTED NOT DETECTED Final   Stenotrophomonas maltophilia NOT DETECTED NOT DETECTED Final   Candida albicans NOT DETECTED NOT DETECTED Final   Candida auris NOT DETECTED NOT DETECTED Final   Candida glabrata NOT DETECTED NOT DETECTED Final   Candida krusei NOT DETECTED NOT DETECTED Final   Candida parapsilosis NOT DETECTED NOT DETECTED Final   Candida tropicalis NOT DETECTED NOT DETECTED Final   Cryptococcus neoformans/gattii NOT DETECTED NOT DETECTED Final    Comment: Performed at [facility], [address], [place], [place] [address]  Resp panel by RT-PCR (RSV, Flu A&B, Covid) Anterior Nasal Swab     Status: None   Collection Time: 07/30/23  2:54 AM   Specimen: Anterior Nasal Swab  Result Value Ref Range Status   SARS Coronavirus 2 by RT PCR NEGATIVE NEGATIVE Final    Comment: (NOTE) SARS-CoV-2 target nucleic acids are NOT DETECTED.  The SARS-CoV-2 RNA is generally detectable in upper respiratory specimens during the acute phase of infection. The lowest concentration of SARS-CoV-2 viral copies this assay can detect is 138 copies/mL. A negative result does not preclude SARS-Cov-2 infection and should not be used as the sole basis for treatment or other patient management decisions. A negative result may  occur with  improper specimen collection/handling, submission of specimen other than nasopharyngeal swab, presence of viral mutation(s) within the areas targeted by this assay, and inadequate number of viral copies(<138 copies/mL). A negative result must be combined with clinical observations, patient history, and epidemiological information. The expected result is Negative.  Fact Sheet for Patients:  BloggerCourse.com  Fact Sheet for Healthcare Providers:  SeriousBroker.it  This test is no t yet approved or cleared by the [place] FDA and  has been authorized for detection and/or diagnosis of SARS-CoV-2 by FDA under an Emergency Use Authorization (EUA). This EUA will remain  in effect (meaning this test can be used) for the duration of the COVID-19 declaration under Section 564(b)(1) of the Act, 21 U.S.C.section 360bbb-3(b)(1), unless the authorization is terminated  or revoked sooner.       Influenza A by PCR NEGATIVE NEGATIVE Final   Influenza B by PCR NEGATIVE NEGATIVE Final    Comment: (NOTE) The Xpert Xpress SARS-CoV-2/FLU/RSV plus assay is intended as an aid in the diagnosis of influenza from Nasopharyngeal swab specimens and should not be used as a sole basis for treatment. Nasal washings and aspirates are unacceptable for Xpert Xpress SARS-CoV-2/FLU/RSV testing.  Fact Sheet for Patients: BloggerCourse.com  Fact Sheet for Healthcare Providers: SeriousBroker.it  This test is not yet approved or cleared by the [place] FDA and has been authorized for detection and/or diagnosis of SARS-CoV-2 by FDA under an Emergency Use Authorization (EUA). This EUA will remain in effect (meaning this test can be used) for the duration of the COVID-19 declaration under Section 564(b)(1) of the Act, 21 U.S.C. section 360bbb-3(b)(1), unless the authorization is terminated  or revoked.     Resp Syncytial Virus by PCR NEGATIVE NEGATIVE Final    Comment: (NOTE) Fact Sheet for Patients: BloggerCourse.com  Fact  Sheet for Healthcare Providers: SeriousBroker.it  This test is not yet approved or cleared by the [place] and has been authorized for detection and/or diagnosis of SARS-CoV-2 by FDA under an Emergency Use Authorization (EUA). This EUA will remain in effect (meaning this test can be used) for the duration of the COVID-19 declaration under Section 564(b)(1) of the Act, 21 U.S.C. section 360bbb-3(b)(1), unless the authorization is terminated or revoked.  Performed at [facility], [address], [place], [place] [address]   Blood Culture (routine x 2)     Status: None (Preliminary result)   Collection Time: 07/30/23  3:37 AM   Specimen: BLOOD  Result Value Ref Range Status   Specimen Description   Final    BLOOD BLOOD RIGHT FOREARM Performed at [facility], [address] [place], [place] [address]    Special Requests   Final    BOTTLES DRAWN AEROBIC AND ANAEROBIC Blood Culture results may not be optimal due to an excessive volume of blood received in culture bottles Performed at [facility], [address], [place], [place] [address]    Culture   Final    NO GROWTH 3 DAYS Performed at [facility], [address] [place], [place] [address]    Report Status PENDING  Incomplete  Urine Culture     Status: Abnormal   Collection Time: 07/30/23  4:17 AM   Specimen: Urine, Random  Result Value Ref Range Status   Specimen Description   Final    URINE, RANDOM Performed at [facility], [address], [place], [place] [address]    Special Requests   Final    NONE Reflexed from [id number] Performed at [facility], [address], [place], [place] [address]    Culture >=100,000  COLONIES/mL PROTEUS MIRABILIS (A)  Final   Report Status 08/01/2023 FINAL  Final   Organism ID, Bacteria PROTEUS MIRABILIS (A)  Final      Susceptibility   Proteus mirabilis - MIC*    AMPICILLIN <=2 SENSITIVE Sensitive     CEFAZOLIN >=64 RESISTANT Resistant     CEFEPIME <=0.12 SENSITIVE Sensitive     CEFTRIAXONE 32 RESISTANT Resistant     CIPROFLOXACIN <=0.25 SENSITIVE Sensitive     GENTAMICIN <=1 SENSITIVE Sensitive     IMIPENEM 1 SENSITIVE Sensitive     NITROFURANTOIN 64 INTERMEDIATE Intermediate     TRIMETH/SULFA <=20 SENSITIVE Sensitive     AMPICILLIN/SULBACTAM <=2 SENSITIVE Sensitive     PIP/TAZO <=4 SENSITIVE Sensitive     * >=100,000 COLONIES/mL PROTEUS MIRABILIS  MRSA Next Gen by PCR, Nasal     Status: None   Collection Time: 07/30/23  5:12 AM   Specimen: Nasal Mucosa; Nasal Swab  Result Value Ref Range Status   MRSA by PCR Next Gen NOT DETECTED NOT DETECTED Final    Comment: (NOTE) The GeneXpert MRSA Assay (FDA approved for NASAL specimens only), is one component of a comprehensive MRSA colonization surveillance program. It is not intended to diagnose MRSA infection nor to guide or monitor treatment for MRSA infections. Test performance is not FDA approved in patients less than 87 years old. Performed at [facility], [address] [place], [place] [address]     Radiology Studies: No results found.  Scheduled Meds:  Chlorhexidine Gluconate Cloth  6 each Topical Daily   DULoxetine  60 mg Oral Daily   [START ON 08/03/2023] hydrocortisone sod succinate (SOLU-CORTEF)  inj  100 mg Intravenous Daily   levothyroxine  25 mcg Oral QAC breakfast   loratadine  10 mg Oral Daily   pregabalin  50 mg Oral QHS   Continuous Infusions:  sodium chloride Stopped (07/30/23 2325)   ampicillin-sulbactam (UNASYN) IV 3 g (08/02/23 1005)   heparin 1,500 Units/hr (08/02/23 0430)     LOS: 3 days    Time spent: 35 mins    Willeen Niece, MD [facility]   If 7PM-7AM, please contact night-coverage

## 2023-08-02 NOTE — Plan of Care (Signed)

## 2023-08-03 DIAGNOSIS — R6520 Severe sepsis without septic shock: Secondary | ICD-10-CM | POA: Diagnosis not present

## 2023-08-03 DIAGNOSIS — A419 Sepsis, unspecified organism: Secondary | ICD-10-CM | POA: Diagnosis not present

## 2023-08-03 DIAGNOSIS — N179 Acute kidney failure, unspecified: Secondary | ICD-10-CM | POA: Diagnosis not present

## 2023-08-03 LAB — BASIC METABOLIC PANEL WITH GFR
Anion gap: 7 (ref 5–15)
BUN: 37 mg/dL — ABNORMAL HIGH (ref 8–23)
CO2: 25 mmol/L (ref 22–32)
Calcium: 8.4 mg/dL — ABNORMAL LOW (ref 8.9–10.3)
Chloride: 107 mmol/L (ref 98–111)
Creatinine, Ser: 1.51 mg/dL — ABNORMAL HIGH (ref 0.61–1.24)
GFR, Estimated: 42 mL/min — ABNORMAL LOW (ref >60)
Glucose, Bld: 89 mg/dL (ref 70–99)
Potassium: 3.3 mmol/L — ABNORMAL LOW (ref 3.5–5.1)
Sodium: 139 mmol/L (ref 135–145)

## 2023-08-03 LAB — APTT: aPTT: 111 s — ABNORMAL HIGH (ref 24–36)

## 2023-08-03 LAB — CBC
HCT: 31.4 % — ABNORMAL LOW (ref 39.0–52.0)
Hemoglobin: 10.1 g/dL — ABNORMAL LOW (ref 13.0–17.0)
MCH: 30 pg (ref 26.0–34.0)
MCHC: 32.2 g/dL (ref 30.0–36.0)
MCV: 93.2 fL (ref 80.0–100.0)
Platelets: 88 10*3/uL — ABNORMAL LOW (ref 150–400)
RBC: 3.37 MIL/uL — ABNORMAL LOW (ref 4.22–5.81)
RDW: 15.9 % — ABNORMAL HIGH (ref 11.5–15.5)
WBC: 7.5 10*3/uL (ref 4.0–10.5)
nRBC: 0 % (ref 0.0–0.2)

## 2023-08-03 LAB — HEPARIN LEVEL (UNFRACTIONATED): Heparin Unfractionated: 0.6 [IU]/mL (ref 0.30–0.70)

## 2023-08-03 LAB — MAGNESIUM: Magnesium: 2.2 mg/dL (ref 1.7–2.4)

## 2023-08-03 LAB — PHOSPHORUS: Phosphorus: 2.2 mg/dL — ABNORMAL LOW (ref 2.5–4.6)

## 2023-08-03 MED ORDER — APIXABAN 2.5 MG PO TABS
2.5000 mg | ORAL_TABLET | Freq: Two times a day (BID) | ORAL | Status: DC
  Administered 2023-08-03 – 2023-08-12 (×20): 2.5 mg via ORAL
  Filled 2023-08-03 (×20): qty 1

## 2023-08-03 MED ORDER — SODIUM CHLORIDE 0.9 % IV SOLN
3.0000 g | Freq: Four times a day (QID) | INTRAVENOUS | Status: AC
  Administered 2023-08-03 – 2023-08-05 (×8): 3 g via INTRAVENOUS
  Filled 2023-08-03 (×8): qty 8

## 2023-08-03 MED ORDER — POTASSIUM PHOSPHATES 15 MMOLE/5ML IV SOLN
30.0000 mmol | Freq: Once | INTRAVENOUS | Status: AC
  Administered 2023-08-03: 30 mmol via INTRAVENOUS
  Filled 2023-08-03: qty 10

## 2023-08-03 NOTE — Progress Notes (Signed)
PROGRESS NOTE    Douglas Walker  ZOX:[id number] DOB: 02-17-1925 DOA: 07/30/2023 PCP: Joya Martyr, MD   Brief Narrative: This 89 years old male with PMH significant for dementia, chronic diastolic heart failure, s/p pacemaker, essential tremor, hyperlipidemia, paroxysmal A-fib on Eliquis, pulmonary hypertension presented in the ED with fever, confusion, generalized weakness and shaking.  Patient also found to have productive cough and hypoxia requiring supplemental oxygen.  Patient was also hypotensive requiring Levophed support.  Patient was admitted in the ICU for sepsis secondary to community-acquired pneumonia  and recurrent UTI.  He was started on empiric antibiotics. TRH pickup 08/01/2023.  Assessment & Plan:   Principal Problem:   Sepsis (HCC) Active Problems:   Septic shock (HCC)  Septic shock sec. to community-acquired pneumonia /recurrent UTI: Patient does have history of multidrug-resistant UTI on chronic Macrobid therapy. Patient was hypotensive requiring Levophed support to keep MAP goal above 65. Lactic acid resolved with IV hydration.   Patient was initiated on meropenem and azithromycin as per ID. Blood pressure has improved, Now off Levophed support. Wean stress dose steroids. Discontinue midodrine as blood pressure has improved. Infectious disease recommended to de-escalate antibiotics to Unasyn.   Meropenem and azithromycin discontinued. Patient can be discharged on Augmentin to complete 7-day antibiotics for possible aspiration pneumonia. Sepsis physiology improving. Follow-up Legionella antigen,   Acute on chronic kidney disease stage IIIa: Baseline Serum creatinine 1.2- 1.3  slightly increased 2.08 Continue IV fluid resuscitation.  Avoid nephrotoxic medications. Serum creatinine improving.  Acute encephalopathy secondary to sepsis: Mental status has been improving. Patient does have history of dementia. As per son patient at baseline can speak but  not talkative from prior stroke.   Obtain CT head to r/o acute change If mental status changes. Speech and swallow eval.  Reorient as able to  Atrial fibrillation: Heart rate is well-controlled.   Patient is started on heparin drip. Will resume Eliquis today if no intervention planned.  Hypothyroidism: continue levothyroxine.  Chronic pain: Continue Cymbalta and Lyrica.  Dementia: No behavioral problem.  Continue to monitor.   DVT prophylaxis: Heparin IV Code Status: DNR Family Communication: No family at bed side. Disposition Plan:  Status is: Inpatient Remains inpatient appropriate because: Admitted for septic shock secondary to community-acquired pneumonia and UTI.  Started on empiric antibiotics    Consultants:  Infectious diseases  Procedures: None Antimicrobials: Anti-infectives (From admission, onward)    Start     Dose/Rate Route Frequency Ordered Stop   08/03/23 1600  Ampicillin-Sulbactam (UNASYN) 3 g in sodium chloride 0.9 % 100 mL IVPB        3 g 200 mL/hr over 30 Minutes Intravenous Every 6 hours 08/03/23 0843 08/05/23 1759   08/01/23 2100  Ampicillin-Sulbactam (UNASYN) 3 g in sodium chloride 0.9 % 100 mL IVPB  Status:  Discontinued        3 g 200 mL/hr over 30 Minutes Intravenous Every 12 hours 08/01/23 1105 08/03/23 0843   07/31/23 0400  cefTRIAXone (ROCEPHIN) 1 g in sodium chloride 0.9 % 100 mL IVPB  Status:  Discontinued        1 g 200 mL/hr over 30 Minutes Intravenous Every 24 hours 07/30/23 0736 07/30/23 0758   07/30/23 1045  ceFEPIme (MAXIPIME) 2 g in sodium chloride 0.9 % 100 mL IVPB  Status:  Discontinued        2 g 200 mL/hr over 30 Minutes Intravenous  Once 07/30/23 1030 07/30/23 1032   07/30/23 1045  metroNIDAZOLE (FLAGYL) IVPB 500  mg  Status:  Discontinued        500 mg 100 mL/hr over 60 Minutes Intravenous  Once 07/30/23 1030 07/30/23 1055   07/30/23 0900  meropenem (MERREM) 500 mg in sodium chloride 0.9 % 100 mL IVPB  Status:  Discontinued         500 mg 200 mL/hr over 30 Minutes Intravenous Every 12 hours 07/30/23 0759 08/01/23 1105   07/30/23 0800  azithromycin (ZITHROMAX) 500 mg in sodium chloride 0.9 % 250 mL IVPB  Status:  Discontinued        500 mg 250 mL/hr over 60 Minutes Intravenous Every 24 hours 07/30/23 0736 08/01/23 1105   07/30/23 0300  vancomycin (VANCOREADY) IVPB 1750 mg/350 mL        1,750 mg 175 mL/hr over 120 Minutes Intravenous  Once 07/30/23 0247 07/30/23 0617   07/30/23 0245  ceFEPIme (MAXIPIME) 2 g in sodium chloride 0.9 % 100 mL IVPB        2 g 200 mL/hr over 30 Minutes Intravenous  Once 07/30/23 0244 07/30/23 0442   07/30/23 0245  metroNIDAZOLE (FLAGYL) IVPB 500 mg        500 mg 100 mL/hr over 60 Minutes Intravenous  Once 07/30/23 0244 07/30/23 0715   07/30/23 0245  vancomycin (VANCOCIN) IVPB 1000 mg/200 mL premix  Status:  Discontinued        1,000 mg 200 mL/hr over 60 Minutes Intravenous  Once 07/30/23 0244 07/30/23 0247      Subjective: Patient was seen and examined at bedside. Overnight events noted. Patient is demented but seems at his baseline mental status. He is following commands. He is hard of hearing. He is oriented to self only.  Appears very deconditioned.   Objective: Vitals:   08/02/23 2209 08/03/23 0202 08/03/23 0500 08/03/23 0620  BP: 110/69 (!) 142/72  136/73  Pulse: 69 69  69  Resp: 20 17  16   Temp: 98 F (36.7 C) 98.8 F (37.1 C)  98.2 F (36.8 C)  TempSrc: Oral Oral  Oral  SpO2: 94% 91%  97%  Weight:   90.4 kg   Height:        Intake/Output Summary (Last 24 hours) at 08/03/2023 1059 Last data filed at 08/03/2023 1027 Gross per 24 hour  Intake 921.8 ml  Output 640 ml  Net 281.8 ml   Filed Weights   08/01/23 0500 08/02/23 0500 08/03/23 0500  Weight: 91.4 kg 92.3 kg 90.4 kg    Examination:  General exam: Appears deconditioned, not in any acute distress. Respiratory system: CTA bilaterally. Respiratory effort normal.  RR 16 Cardiovascular system: S1 & S2  heard, irregular rhythm, no murmur. Gastrointestinal system: Abdomen is soft, non tender, non distended, bowel sounds present. Central nervous system: Alert and oriented x 1. No focal neurological deficits. Extremities: No edema, no cyanosis, no clubbing Skin: No rashes, lesions or ulcers Psychiatry:Mood & affect appropriate.     Data Reviewed: I have personally reviewed following labs and imaging studies  CBC: Recent Labs  Lab 07/30/23 0247 07/31/23 0312 08/01/23 0319 08/02/23 0053 08/03/23 0603  WBC 9.6 24.7* 23.0* 15.3* 7.5  NEUTROABS 9.2*  --   --   --   --   HGB 11.4* 10.0* 10.8* 10.3* 10.1*  HCT 35.6* 30.9* 32.9* 31.9* 31.4*  MCV 93.7 94.5 94.3 92.7 93.2  PLT 116* 81* 84* 83* 88*   Basic Metabolic Panel: Recent Labs  Lab 07/29/23 1445 07/30/23 0247 07/31/23 0312 08/02/23 0053 08/03/23 0603  NA  140 135 138 137 139  K 3.8 3.4* 3.9 3.3* 3.3*  CL 100 98 104 107 107  CO2 32 26 24 23 25   GLUCOSE 94 89 115* 131* 89  BUN 31* 34* 41* 44* 37*  CREATININE 1.76* 1.82* 2.05* 1.63* 1.51*  CALCIUM 9.5 9.4 8.5* 8.4* 8.4*  MG  --   --  2.1 2.3 2.2  PHOS  --   --  4.2 3.1 2.2*   GFR: Estimated Creatinine Clearance: 32.5 mL/min (A) (by C-G formula based on SCr of 1.51 mg/dL (H)). Liver Function Tests: Recent Labs  Lab 07/29/23 1445 07/30/23 0247 07/31/23 0312  AST 20 30 24   ALT 19 28 21   ALKPHOS  --  97 67  BILITOT 0.5 0.7 0.9  PROT 7.6 8.4* 6.8  ALBUMIN  --  3.5 2.6*   No results for input(s): "LIPASE", "AMYLASE" in the last 168 hours. Recent Labs  Lab 07/30/23 1204  AMMONIA 12   Coagulation Profile: Recent Labs  Lab 07/30/23 0247  INR 1.2   Cardiac Enzymes: No results for input(s): "CKTOTAL", "CKMB", "CKMBINDEX", "TROPONINI" in the last 168 hours. BNP (last 3 results) Recent Labs    02/04/23 1619  PROBNP 1,402*   HbA1C: No results for input(s): "HGBA1C" in the last 72 hours. CBG: No results for input(s): "GLUCAP" in the last 168 hours. Lipid  Profile: No results for input(s): "CHOL", "HDL", "LDLCALC", "TRIG", "CHOLHDL", "LDLDIRECT" in the last 72 hours. Thyroid Function Tests: No results for input(s): "TSH", "T4TOTAL", "FREET4", "T3FREE", "THYROIDAB" in the last 72 hours.  Anemia Panel: No results for input(s): "VITAMINB12", "FOLATE", "FERRITIN", "TIBC", "IRON", "RETICCTPCT" in the last 72 hours. Sepsis Labs: Recent Labs  Lab 07/30/23 0302 07/30/23 0457 07/30/23 1150  LATICACIDVEN 2.4* 2.3* 1.7    Recent Results (from the past 240 hour(s))  Blood Culture (routine x 2)     Status: None (Preliminary result)   Collection Time: 07/30/23  2:47 AM   Specimen: BLOOD  Result Value Ref Range Status   Specimen Description   Final    BLOOD RIGHT ANTECUBITAL Performed at [facility], [address], [place], [place] [address]    Special Requests   Final    BOTTLES DRAWN AEROBIC AND ANAEROBIC Blood Culture results may not be optimal due to an excessive volume of blood received in culture bottles Performed at [facility], [address], [place], [place] [address]    Culture  Setup Time   Final    GRAM NEGATIVE RODS ANAEROBIC BOTTLE ONLY CRITICAL RESULT CALLED TO, READ BACK BY AND VERIFIED WITH: PHARMD MICHELLE BELL ON 08/01/23 @ 1946 BY DRT    Culture   Final    GRAM NEGATIVE RODS NO GROWTH ON PLATES REINC Performed at [facility], [address] [place], [place] [address]    Report Status PENDING  Incomplete  Blood Culture ID Panel (Reflexed)     Status: None   Collection Time: 07/30/23  2:47 AM  Result Value Ref Range Status   Enterococcus faecalis NOT DETECTED NOT DETECTED Final   Enterococcus Faecium NOT DETECTED NOT DETECTED Final   Listeria monocytogenes NOT DETECTED NOT DETECTED Final   Staphylococcus species NOT DETECTED NOT DETECTED Final   Staphylococcus aureus (BCID) NOT DETECTED NOT DETECTED Final   Staphylococcus epidermidis NOT DETECTED NOT DETECTED Final    Staphylococcus lugdunensis NOT DETECTED NOT DETECTED Final   Streptococcus species NOT DETECTED NOT DETECTED Final   Streptococcus agalactiae NOT DETECTED  NOT DETECTED Final   Streptococcus pneumoniae NOT DETECTED NOT DETECTED Final   Streptococcus pyogenes NOT DETECTED NOT DETECTED Final   A.calcoaceticus-baumannii NOT DETECTED NOT DETECTED Final   Bacteroides fragilis NOT DETECTED NOT DETECTED Final   Enterobacterales NOT DETECTED NOT DETECTED Final   Enterobacter cloacae complex NOT DETECTED NOT DETECTED Final   Escherichia coli NOT DETECTED NOT DETECTED Final   Klebsiella aerogenes NOT DETECTED NOT DETECTED Final   Klebsiella oxytoca NOT DETECTED NOT DETECTED Final   Klebsiella pneumoniae NOT DETECTED NOT DETECTED Final   Proteus species NOT DETECTED NOT DETECTED Final   Salmonella species NOT DETECTED NOT DETECTED Final   Serratia marcescens NOT DETECTED NOT DETECTED Final   Haemophilus influenzae NOT DETECTED NOT DETECTED Final   Neisseria meningitidis NOT DETECTED NOT DETECTED Final   Pseudomonas aeruginosa NOT DETECTED NOT DETECTED Final   Stenotrophomonas maltophilia NOT DETECTED NOT DETECTED Final   Candida albicans NOT DETECTED NOT DETECTED Final   Candida auris NOT DETECTED NOT DETECTED Final   Candida glabrata NOT DETECTED NOT DETECTED Final   Candida krusei NOT DETECTED NOT DETECTED Final   Candida parapsilosis NOT DETECTED NOT DETECTED Final   Candida tropicalis NOT DETECTED NOT DETECTED Final   Cryptococcus neoformans/gattii NOT DETECTED NOT DETECTED Final    Comment: Performed at [facility], [address], [place], [place] [address]  Resp panel by RT-PCR (RSV, Flu A&B, Covid) Anterior Nasal Swab     Status: None   Collection Time: 07/30/23  2:54 AM   Specimen: Anterior Nasal Swab  Result Value Ref Range Status   SARS Coronavirus 2 by RT PCR NEGATIVE NEGATIVE Final    Comment: (NOTE) SARS-CoV-2 target nucleic acids are NOT DETECTED.  The SARS-CoV-2 RNA is  generally detectable in upper respiratory specimens during the acute phase of infection. The lowest concentration of SARS-CoV-2 viral copies this assay can detect is 138 copies/mL. A negative result does not preclude SARS-Cov-2 infection and should not be used as the sole basis for treatment or other patient management decisions. A negative result may occur with  improper specimen collection/handling, submission of specimen other than nasopharyngeal swab, presence of viral mutation(s) within the areas targeted by this assay, and inadequate number of viral copies(<138 copies/mL). A negative result must be combined with clinical observations, patient history, and epidemiological information. The expected result is Negative.  Fact Sheet for Patients:  BloggerCourse.com  Fact Sheet for Healthcare Providers:  SeriousBroker.it  This test is no t yet approved or cleared by the [place] FDA and  has been authorized for detection and/or diagnosis of SARS-CoV-2 by FDA under an Emergency Use Authorization (EUA). This EUA will remain  in effect (meaning this test can be used) for the duration of the COVID-19 declaration under Section 564(b)(1) of the Act, 21 U.S.C.section 360bbb-3(b)(1), unless the authorization is terminated  or revoked sooner.       Influenza A by PCR NEGATIVE NEGATIVE Final   Influenza B by PCR NEGATIVE NEGATIVE Final    Comment: (NOTE) The Xpert Xpress SARS-CoV-2/FLU/RSV plus assay is intended as an aid in the diagnosis of influenza from Nasopharyngeal swab specimens and should not be used as a sole basis for treatment. Nasal washings and aspirates are unacceptable for Xpert Xpress SARS-CoV-2/FLU/RSV testing.  Fact Sheet for Patients: BloggerCourse.com  Fact Sheet for Healthcare Providers: SeriousBroker.it  This test is not yet approved or cleared by the [place] FDA and has been authorized for detection and/or diagnosis of SARS-CoV-2  by FDA under an Emergency Use Authorization (EUA). This EUA will remain in effect (meaning this test can be used) for the duration of the COVID-19 declaration under Section 564(b)(1) of the Act, 21 U.S.C. section 360bbb-3(b)(1), unless the authorization is terminated or revoked.     Resp Syncytial Virus by PCR NEGATIVE NEGATIVE Final    Comment: (NOTE) Fact Sheet for Patients: BloggerCourse.com  Fact Sheet for Healthcare Providers: SeriousBroker.it  This test is not yet approved or cleared by the [place] FDA and has been authorized for detection and/or diagnosis of SARS-CoV-2 by FDA under an Emergency Use Authorization (EUA). This EUA will remain in effect (meaning this test can be used) for the duration of the COVID-19 declaration under Section 564(b)(1) of the Act, 21 U.S.C. section 360bbb-3(b)(1), unless the authorization is terminated or revoked.  Performed at [facility], [address], [place], [place] [address]   Blood Culture (routine x 2)     Status: None (Preliminary result)   Collection Time: 07/30/23  3:37 AM   Specimen: BLOOD  Result Value Ref Range Status   Specimen Description   Final    BLOOD BLOOD RIGHT FOREARM Performed at [facility], [address], [place], [place] [address]    Special Requests   Final    BOTTLES DRAWN AEROBIC AND ANAEROBIC Blood Culture results may not be optimal due to an excessive volume of blood received in culture bottles Performed at [facility], [address] [place], [place] [address]    Culture   Final    NO GROWTH 4 DAYS Performed at [facility], [address], [place], [place] [address]    Report Status PENDING  Incomplete  Urine Culture     Status: Abnormal   Collection Time: 07/30/23  4:17 AM   Specimen: Urine, Random   Result Value Ref Range Status   Specimen Description   Final    URINE, RANDOM Performed at [facility], [address], [place], [place] [address]    Special Requests   Final    NONE Reflexed from [id number] Performed at [facility], [address], [place], [place] [address]    Culture >=100,000 COLONIES/mL PROTEUS MIRABILIS (A)  Final   Report Status 08/01/2023 FINAL  Final   Organism ID, Bacteria PROTEUS MIRABILIS (A)  Final      Susceptibility   Proteus mirabilis - MIC*    AMPICILLIN <=2 SENSITIVE Sensitive     CEFAZOLIN >=64 RESISTANT Resistant     CEFEPIME <=0.12 SENSITIVE Sensitive     CEFTRIAXONE 32 RESISTANT Resistant     CIPROFLOXACIN <=0.25 SENSITIVE Sensitive     GENTAMICIN <=1 SENSITIVE Sensitive     IMIPENEM 1 SENSITIVE Sensitive     NITROFURANTOIN 64 INTERMEDIATE Intermediate     TRIMETH/SULFA <=20 SENSITIVE Sensitive     AMPICILLIN/SULBACTAM <=2 SENSITIVE Sensitive     PIP/TAZO <=4 SENSITIVE Sensitive     * >=100,000 COLONIES/mL PROTEUS MIRABILIS  MRSA Next Gen by PCR, Nasal     Status: None   Collection Time: 07/30/23  5:12 AM   Specimen: Nasal Mucosa; Nasal Swab  Result Value Ref Range Status   MRSA by PCR Next Gen NOT DETECTED NOT DETECTED Final    Comment: (NOTE) The GeneXpert MRSA Assay (FDA approved for NASAL specimens only), is one component of a comprehensive MRSA colonization surveillance program. It is not intended to diagnose MRSA infection nor to guide or monitor  treatment for MRSA infections. Test performance is not FDA approved in patients less than 34 years old. Performed at [facility], [address] [place], [place] [address]     Radiology Studies: No results found.  Scheduled Meds:  Chlorhexidine Gluconate Cloth  6 each Topical Daily   DULoxetine  60 mg Oral Daily   hydrocortisone sod succinate (SOLU-CORTEF) inj  100 mg Intravenous Daily   levothyroxine  25 mcg Oral QAC  breakfast   loratadine  10 mg Oral Daily   pregabalin  50 mg Oral QHS   Continuous Infusions:  sodium chloride Stopped (07/30/23 2325)   ampicillin-sulbactam (UNASYN) IV     heparin 1,500 Units/hr (08/02/23 1907)   potassium PHOSPHATE IVPB (in mmol) 30 mmol (08/03/23 0935)     LOS: 4 days    Time spent: 35 mins    Willeen Niece, MD [facility]   If 7PM-7AM, please contact night-coverage

## 2023-08-03 NOTE — Progress Notes (Addendum)
ANTICOAGULATION CONSULT NOTE  Pharmacy Consult for Heparin Indication: atrial fibrillation, PTA Eliquis held  Allergies  Allergen Reactions   Sulfa Antibiotics Other (See Comments)    Weakness Dizziness  Sweats    Mysoline [Primidone] Other (See Comments)    Sedation    Zocor [Simvastatin] Other (See Comments)    Arthralgias Fatigue    Patient Measurements: Height: 5\' 9"  (175.3 cm) (Per son) Weight: 90.4 kg (199 lb 4.7 oz) IBW/kg (Calculated) : 70.7  Vital Signs: Temp: 98.2 F (36.8 C) (08/04 0620) Temp Source: Oral (08/04 0620) BP: 136/73 (08/04 0620) Pulse Rate: 69 (08/04 0620)  Labs: Recent Labs    08/01/23 0319 08/01/23 1245 08/02/23 0053 08/02/23 1018 08/03/23 0603  HGB 10.8*  --  10.3*  --  10.1*  HCT 32.9*  --  31.9*  --  31.4*  PLT 84*  --  83*  --  88*  APTT 52*   < > 100* 90* 111*  HEPARINUNFRC 1.10*  --   --  0.79* 0.60  CREATININE  --   --  1.63*  --  1.51*   < > = values in this interval not displayed.    Estimated Creatinine Clearance: 32.5 mL/min (A) (by C-G formula based on SCr of 1.51 mg/dL (H)).   Medications:  Scheduled:   Chlorhexidine Gluconate Cloth  6 each Topical Daily   DULoxetine  60 mg Oral Daily   hydrocortisone sod succinate (SOLU-CORTEF) inj  100 mg Intravenous Daily   levothyroxine  25 mcg Oral QAC breakfast   loratadine  10 mg Oral Daily   pregabalin  50 mg Oral QHS   Infusions:   sodium chloride Stopped (07/30/23 2325)   ampicillin-sulbactam (UNASYN) IV 3 g (08/03/23 0807)   heparin 1,500 Units/hr (08/02/23 1907)    Assessment: 89 yoM admitted on 7/31 with sepsis.  PMH significant for afib on prior to admission Eliquis.  Pharmacy is consulted to dose Heparin while he is now NPO.    PTA apixaban initially resumed on admission - last dose given 7/31 at 10am.  Expect falsely elevated heparin level due to recent DOAC use.     Today, 08/03/2023: Heparin level now therapeutic after previously being falsely elevated from  DOAC use - given aPTT now relatively higher than heparin level, safe to conclude DOAC effects have worn off Will ignore aPTT since heparin level (without DOAC interference) is the more accurate lab Hgb and Plt both remain low but stable No bleeding or infusion issues reported SCr elevated but decreasing; may have reached new baseline (lowest reading since Jan 2024)  Goal of Therapy:  Heparin level 0.3-0.7 units/ml aPTT 66-102 seconds Monitor platelets by anticoagulation protocol: Yes  Plan:  Continue heparin IV infusion @ current rate of 1500 units/hr Daily CBC & heparin level Monitor for s/s bleeding or thrombosis F/u for ability to resume DOAC  Will also adjust Unasyn to 3g IV q6 hr per standing Pharmacy antibiotic protocol, with CrCl now > 30 and improving   Thank you for allowing pharmacy to be a part of this patient's care.  Bernadene Person, PharmD, BCPS [phone] 08/03/2023, 8:28 AM

## 2023-08-03 NOTE — Evaluation (Addendum)
Physical Therapy Evaluation Patient Details Name: Douglas INGWERSEN MRN: [id number] DOB: 01-Apr-1925 Today's Date: 08/03/2023  History of Present Illness  89 years old male presented in the ED with fever, confusion, generalized weakness and shaking and admitted 07/30/23 for Septic shock secondary to community-acquired pneumonia /recurrent UTI.  PMH significant for but not limited to: dementia, chronic diastolic heart failure, s/p pacemaker, essential tremor primarily involving the lips/oral musculature, hyperlipidemia, paroxysmal A-fib on Eliquis, pulmonary hypertension, chronic foley catheter, CVA, neuropathy  Clinical Impression  Pt admitted with above diagnosis.  Pt currently with functional limitations due to the deficits listed below (see PT Problem List). Pt will benefit from acute skilled PT to increase their independence and safety with mobility to allow discharge.  Pt very HOH however agreeable to mobilize.  Pt overall requiring min assist for ambulation with LOB x2.  Pt from home with family.  Recommend initial assist available upon d/c for pt safety and HHPT.         If plan is discharge home, recommend the following: A little help with walking and/or transfers;A little help with bathing/dressing/bathroom;Help with stairs or ramp for entrance;Assistance with cooking/housework   Can travel by private vehicle        Equipment Recommendations None recommended by PT  Recommendations for Other Services       Functional Status Assessment Patient has had a recent decline in their functional status and demonstrates the ability to make significant improvements in function in a reasonable and predictable amount of time.     Precautions / Restrictions Precautions Precautions: Fall Precaution Comments: blind right eye, HOH even with hearing aides Restrictions Weight Bearing Restrictions: No      Mobility  Bed Mobility Overal bed mobility: Needs Assistance Bed Mobility: Supine to  Sit     Supine to sit: Min guard, HOB elevated     General bed mobility comments: increased time and effort    Transfers Overall transfer level: Needs assistance Equipment used: Rolling walker (2 wheels) Transfers: Sit to/from Stand Sit to Stand: Min assist           General transfer comment: assist to rise and steady, pt utilizes UEs to self assist    Ambulation/Gait Ambulation/Gait assistance: Min assist, Min guard Gait Distance (Feet): 100 Feet Assistive device: Rolling walker (2 wheels) Gait Pattern/deviations: Step-through pattern, Decreased stride length Gait velocity: decr     General Gait Details: mostly contact guard however mild LOB x2 requiring min assist to correct  Stairs            Wheelchair Mobility     Tilt Bed    Modified Rankin (Stroke Patients Only)       Balance Overall balance assessment: History of Falls                                           Pertinent Vitals/Pain Pain Assessment Pain Assessment: No/denies pain    Home Living Family/patient expects to be discharged to:: Private residence Living Arrangements: Children Available Help at Discharge: Available 24 hours/day Type of Home: House Home Access: Level entry       Home Layout: One level Home Equipment: Agricultural consultant (2 wheels);Shower seat;Grab bars - tub/shower Additional Comments: lives with son, DIL, grandson. "someone is always there with me"    Prior Function Prior Level of Function : Independent/Modified Independent;History of Falls (last six months)  Mobility Comments: Used RW at [employer] I ADLs Comments: mod I     Hand Dominance        Extremity/Trunk Assessment        Lower Extremity Assessment Lower Extremity Assessment: Generalized weakness    Cervical / Trunk Assessment Cervical / Trunk Assessment: Kyphotic  Communication   Communication: HOH  Cognition Arousal/Alertness: Awake/alert Behavior During  Therapy: WFL for tasks assessed/performed Overall Cognitive Status: History of cognitive impairments - at baseline                                 General Comments: hx of dementia, likely at baseline, follows simple commands        General Comments      Exercises     Assessment/Plan    PT Assessment Patient needs continued PT services  PT Problem List Decreased strength;Decreased activity tolerance;Decreased balance;Decreased mobility;Decreased knowledge of use of DME       PT Treatment Interventions DME instruction;Gait training;Functional mobility training;Therapeutic activities;Patient/family education;Therapeutic exercise;Balance training    PT Goals (Current goals can be found in the Care Plan section)  Acute Rehab PT Goals PT Goal Formulation: With patient Time For Goal Achievement: 08/17/23 Potential to Achieve Goals: Good    Frequency Min 1X/week     Co-evaluation               AM-PAC PT "6 Clicks" Mobility  Outcome Measure Help needed turning from your back to your side while in a flat bed without using bedrails?: A Little Help needed moving from lying on your back to sitting on the side of a flat bed without using bedrails?: A Little Help needed moving to and from a bed to a chair (including a wheelchair)?: A Little Help needed standing up from a chair using your arms (e.g., wheelchair or bedside chair)?: A Little Help needed to walk in hospital room?: A Little Help needed climbing 3-5 steps with a railing? : A Lot 6 Click Score: 17    End of Session Equipment Utilized During Treatment: Gait belt Activity Tolerance: Patient tolerated treatment well Patient left: in chair;with call bell/phone within reach;with chair alarm set Nurse Communication: Mobility status PT Visit Diagnosis: Difficulty in walking, not elsewhere classified (R26.2);Muscle weakness (generalized) (M62.81)    Time: 9562-1308 PT Time Calculation (min) (ACUTE ONLY):  20 min   Charges:   PT Evaluation $PT Eval Low Complexity: 1 Low   PT General Charges $$ ACUTE PT VISIT: 1 Visit       Thomasene Mohair PT, DPT Physical Therapist Acute Rehabilitation Services Office: [phone]   Janan Halter Payson 08/03/2023, 11:42 AM

## 2023-08-04 DIAGNOSIS — N179 Acute kidney failure, unspecified: Secondary | ICD-10-CM | POA: Diagnosis not present

## 2023-08-04 DIAGNOSIS — A419 Sepsis, unspecified organism: Secondary | ICD-10-CM | POA: Diagnosis not present

## 2023-08-04 DIAGNOSIS — R6520 Severe sepsis without septic shock: Secondary | ICD-10-CM | POA: Diagnosis not present

## 2023-08-04 MED ORDER — POTASSIUM CHLORIDE 20 MEQ PO PACK
40.0000 meq | PACK | Freq: Once | ORAL | Status: DC
  Filled 2023-08-04: qty 2

## 2023-08-04 MED ORDER — HYDROCORTISONE SOD SUC (PF) 100 MG IJ SOLR
50.0000 mg | Freq: Every day | INTRAMUSCULAR | Status: DC
  Administered 2023-08-05: 50 mg via INTRAVENOUS
  Filled 2023-08-04: qty 1

## 2023-08-04 MED ORDER — POTASSIUM CHLORIDE CRYS ER 20 MEQ PO TBCR
40.0000 meq | EXTENDED_RELEASE_TABLET | Freq: Once | ORAL | Status: AC
  Administered 2023-08-04: 40 meq via ORAL
  Filled 2023-08-04: qty 2

## 2023-08-04 NOTE — Care Management Important Message (Signed)
Important Message  Patient Details IM Letter placed in Patient's room. Name: Douglas Walker MRN: [id number] Date of Birth: 09-22-1925   Medicare Important Message Given:  Yes     Caren Macadam 08/04/2023, 12:35 PM

## 2023-08-04 NOTE — Plan of Care (Signed)

## 2023-08-04 NOTE — TOC Initial Note (Addendum)
Transition of Care [facility]) - Initial/Assessment Note    Patient Details  Name: Douglas Walker MRN: [id number] Date of Birth: September 14, 1925  Transition of Care [facility]) CM/SW Contact:    Adrian Prows, RN Phone Number: 08/04/2023, 9:37 AM  Clinical Narrative:                 [facility] consult for d/c planning; spoke w/ pt in room and he would like for me to speak w/ his son; contacted pt's son Douglas Walker ([phone]); son denies pt experiencing IPV, food/housing insecurity, and difficulty paying utilities; pt is blind, and has bilateral HA; he also has dentures; pt is from home w/ his son; however he declines recc HHPT and would like for pt to d/c to SNF for rehab; he says the family can not handle pt at home d/t their medical issues, and the pt's c/o pain with physical activity; [name] notified MD/PT will be notified of his concerns; he says he would like for pt to d/c to [facility]; explained SNF process and insurance auth needed; further explained this is contingent on recc per PT and MD; he wants bed search limited to [facility]; he says pt os continent of bowel/bladder, and he can feed himself; he also says pt has pressure ulcer on his back; Dr Arta Silence notified; awaiting PT/OT eval  Expected Discharge Plan: Home w Home Health Services Barriers to Discharge: Continued Medical Work up   Patient Goals and CMS Choice Patient states their goals for this hospitalization and ongoing recovery are:: patient's son Douglas Walker would like for pt to d/c to SNF          Expected Discharge Plan and Services   Discharge Planning Services: CM Consult Post Acute Care Choice: Skilled Nursing Facility Living arrangements for the past 2 months: Single Family Home                                      Prior Living Arrangements/Services Living arrangements for the past 2 months: Single Family Home Lives with:: Adult Children Patient language and need for interpreter reviewed::  Yes Do you feel safe going back to the place where you live?: Yes      Need for Family Participation in Patient Care: Yes (Comment) Care giver support system in place?: Yes (comment) Current home services: DME (Walker, shower chair)    Activities of Daily Living Home Assistive Devices/Equipment: Eyeglasses, Hearing aid, Scales, Walker (specify type), Bedside commode/3-in-1, Shower chair with back, Dentures (specify type), Grab bars in shower, Grab bars around toilet, Wheelchair ADL Screening (condition at time of admission) Patient's cognitive ability adequate to safely complete daily activities?: No Is the patient deaf or have difficulty hearing?: Yes Does the patient have difficulty seeing, even when wearing glasses/contacts?: Yes Does the patient have difficulty concentrating, remembering, or making decisions?: Yes Patient able to express need for assistance with ADLs?: No Does the patient have difficulty dressing or bathing?: Yes Independently performs ADLs?: No Communication: Needs assistance Is this a change from baseline?: Pre-admission baseline Dressing (OT): Independent with device (comment) Grooming: Independent with device (comment) Feeding: Independent with device (comment) Bathing: Independent with device (comment) Toileting: Independent with device (comment) In/Out Bed: Independent with device (comment) Walks in Home: Independent with device (comment) Dan Humphreys) Does the patient have difficulty walking or climbing stairs?: Yes (Patient's son states they only have one step in the whole house) Weakness  of Legs: None Weakness of Arms/Hands: None  Permission Sought/Granted Permission sought to share information with : Case Manager Permission granted to share information with : Yes, Verbal Permission Granted  Share Information with NAME: Case Manager     Permission granted to share info w Relationship: Douglas Walker (son) [phone]     Emotional  Assessment Appearance:: Appears stated age Attitude/Demeanor/Rapport: Gracious Affect (typically observed): Accepting Orientation: : Oriented to Self, Oriented to Place, Oriented to  Time, Oriented to Situation Alcohol / Substance Use: Not Applicable Psych Involvement: No (comment)  Admission diagnosis:  Septic shock (HCC) [A41.9, R65.21] Sepsis (HCC) [A41.9] Sepsis, due to unspecified organism, unspecified whether acute organ dysfunction present [facility]) [A41.9] Patient Active Problem List   Diagnosis Date Noted   Sepsis (HCC) 07/30/2023   Septic shock (HCC) 07/30/2023   Aneurysm of ascending aorta without rupture (HCC) 02/04/2023   Coronary artery calcification 02/04/2023   Aortic atherosclerosis (HCC) 02/04/2023   Hematuria 01/02/2023   FTT (failure to thrive) in adult 01/02/2023   Severe sepsis (HCC) 10/13/2022   Nausea and vomiting 10/13/2022   Normocytic anemia 10/13/2022   AAA (abdominal aortic aneurysm) (HCC) 10/13/2022   Physical deconditioning 08/29/2022   Eye pain, right 08/10/2022   Blind painful right eye 08/08/2022   Vitreous hemorrhage of right eye (HCC) 08/05/2022   Choroidal hemorrhage, right 08/05/2022   Secondary glaucoma due to combination mechanisms, right, moderate stage 08/05/2022   History of corneal transplant 05/16/2022   Chronic indwelling Foley catheter 01/31/2022   Benign prostatic hyperplasia with lower urinary tract symptoms 01/31/2022   Medication monitoring encounter 01/31/2022   Multiple drug resistant organism (MDRO) culture positive 01/31/2022   Bacteremia due to Proteus species 10/11/2021   Complicated UTI (urinary tract infection) 07/02/2021   Pain in both testicles    Right hemiparesis (HCC) 04/23/2021   Cerebrovascular accident (CVA) due to embolism of left middle cerebral artery (HCC) 04/23/2021   Neuropathy 04/23/2021   Abnormality of gait 04/23/2021   Abnormal results of thyroid function studies 04/04/2021   Acquired iron deficiency  anemia due to decreased absorption 04/04/2021   Acquired thrombophilia (HCC) 04/04/2021   Cerebral atherosclerosis 04/04/2021   Basal cell carcinoma of nose 04/04/2021   Benign hypertensive heart and renal disease, with heart and renal failure (HCC) 04/04/2021   Benign prostatic hyperplasia 04/04/2021   Obesity 04/04/2021   Bradycardia 04/04/2021   Closed fracture of one rib 04/04/2021   Constipation by delayed colonic transit 04/04/2021   Fatigue 04/04/2021   Gastro-esophageal reflux disease without esophagitis 04/04/2021   Hypothyroidism 04/04/2021   Left lower quadrant pain 04/04/2021   Lumbar radiculopathy 04/04/2021   Reactive depression 04/04/2021   Mass of chest wall 04/04/2021   Mixed hyperlipidemia 04/04/2021   Nocturia 04/04/2021   Other shoulder lesions, right shoulder 04/04/2021   Pain in limb 04/04/2021   Peripheral edema 04/04/2021   Personal history of malignant neoplasm of bladder 04/04/2021   Pruritus of genitalia 04/04/2021   Tremor 04/04/2021   Unilateral primary osteoarthritis, left knee 04/04/2021   Unspecified mononeuropathy of unspecified lower limb 04/04/2021   Urinary incontinence 04/04/2021   Dysphagia, post-stroke    Chronic diastolic congestive heart failure (HCC)    Chronic pain syndrome    Left middle cerebral artery stroke (HCC) 02/01/2021   Pacemaker 08/30/2020   Hematoma, chest wall 07/24/2020   Exudative age-related macular degeneration of right eye with inactive choroidal neovascularization (HCC) 05/10/2020   Exudative age-related macular degeneration of left eye with inactive choroidal  neovascularization (HCC) 05/10/2020   Retinal hemorrhage of right eye 05/10/2020   Exposure keratopathy, bilateral 05/10/2020   Bilateral dry eyes 05/10/2020   Advanced nonexudative age-related macular degeneration of both eyes with subfoveal involvement 05/10/2020   Complete heart block (HCC) 12/04/2018   Chronic low back pain 12/15/2017   Hereditary and  idiopathic peripheral neuropathy 07/01/2016   Essential tremor 02/28/2016   BPPV (benign paroxysmal positional vertigo) 06/19/2015   Dizziness 06/18/2015   Stage 3a chronic kidney disease (CKD) (HCC) 06/18/2015   Chronic diastolic CHF (congestive heart failure) (HCC) 05/30/2015   Fall    Syncope 05/19/2015   Lower urinary tract infectious disease 05/19/2015   Hypotension 05/19/2015   Fracture of rib of left side 05/19/2015   Syncope and collapse 05/19/2015   Mobitz type II atrioventricular block 09/13/2014   Essential hypertension 09/13/2014   Chronic anticoagulation 09/13/2014   SSS (sick sinus syndrome) (HCC) 09/13/2014   Paroxysmal atrial fibrillation (HCC) 10/28/2013   Thrombocytopenia (HCC) 04/06/2013   PCP:  Joya Martyr, MD Pharmacy:   [id number] - [place], [place] - [address] [place] [address] Phone: [phone] Fax: [phone]     Social Determinants of Health (SDOH) Social History: SDOH Screenings   Food Insecurity: No Food Insecurity (08/04/2023)  Housing: Low Risk  (08/04/2023)  Transportation Needs: No Transportation Needs (08/04/2023)  Utilities: Not At Risk (08/04/2023)  Depression (PHQ2-9): Low Risk  (07/29/2023)  Tobacco Use: Medium Risk (07/30/2023)   SDOH Interventions: Food Insecurity Interventions: Intervention Not Indicated, Inpatient TOC Housing Interventions: Intervention Not Indicated, Inpatient TOC Transportation Interventions: Intervention Not Indicated Utilities Interventions: Intervention Not Indicated, Inpatient TOC   Readmission Risk Interventions    08/04/2023    9:31 AM 10/15/2021   10:43 AM  Readmission Risk Prevention Plan  Transportation Screening Complete Complete  PCP or Specialist Appt within 3-5 Days Complete   HRI or Home Care Consult Complete   Social Work Consult for Recovery Care Planning/Counseling Complete   Palliative Care Screening Complete   Medication  Review Ocean[employer]) Complete Complete  PCP or Specialist appointment within 3-5 days of discharge  Complete  HRI or Home Care Consult  Complete  SW Recovery Care/Counseling Consult  Complete  Palliative Care Screening  Not Applicable  Skilled Nursing Facility  Complete

## 2023-08-04 NOTE — Evaluation (Signed)
Occupational Therapy Evaluation Patient Details Name: Douglas Walker MRN: [id number] DOB: Apr 05, 1925 Today's Date: 08/04/2023   History of Present Illness 89 years old male presented in the ED with fever, confusion, generalized weakness and shaking and admitted 07/30/23 for Septic shock secondary to community-acquired pneumonia /recurrent UTI.  PMH significant for but not limited to: dementia, chronic diastolic heart failure, s/p pacemaker, essential tremor primarily involving the lips/oral musculature, hyperlipidemia, paroxysmal A-fib on Eliquis, pulmonary hypertension, chronic foley catheter, CVA, neuropathy   Clinical Impression   Patient admitted for the diagnosis above.  PTA he lives with family, who state he is Mod I for mobility in his home at [facility] level, and is able to perform his own ADL with family there in case he needs help.  Son and daughter in law stating patient needs to be Mod I in order to return home, as they cannot provide any physical assist.  Currently he is needing up to Min A for basic mobility and ADL completion from a sit to stand level.  OT is indicated in the acute setting to address deficits, and Patient will benefit from continued inpatient follow up therapy, <3 hours/day       Recommendations for follow up therapy are one component of a multi-disciplinary discharge planning process, led by the attending physician.  Recommendations may be updated based on patient status, additional functional criteria and insurance authorization.   Assistance Recommended at Discharge Frequent or constant Supervision/Assistance  Patient can return home with the following A little help with bathing/dressing/bathroom;Assistance with cooking/housework;Assist for transportation;Direct supervision/assist for financial management;Direct supervision/assist for medications management;A little help with walking and/or transfers    Functional Status Assessment  Patient has had a recent decline  in their functional status and demonstrates the ability to make significant improvements in function in a reasonable and predictable amount of time.  Equipment Recommendations  None recommended by OT    Recommendations for Other Services       Precautions / Restrictions Precautions Precautions: Fall Precaution Comments: blind right eye, HOH even with hearing aides Restrictions Weight Bearing Restrictions: No      Mobility Bed Mobility Overal bed mobility: Needs Assistance Bed Mobility: Supine to Sit     Supine to sit: Min assist          Transfers Overall transfer level: Needs assistance Equipment used: Rolling walker (2 wheels) Transfers: Sit to/from Stand, Bed to chair/wheelchair/BSC Sit to Stand: Min assist     Step pivot transfers: Min guard            Balance Overall balance assessment: History of Falls, Needs assistance Sitting-balance support: Feet supported Sitting balance-Leahy Scale: Fair     Standing balance support: Reliant on assistive device for balance Standing balance-Leahy Scale: Poor                             ADL either performed or assessed with clinical judgement   ADL Overall ADL's : Needs assistance/impaired Eating/Feeding: Set up;Sitting   Grooming: Wash/dry hands;Wash/dry face;Set up;Sitting   Upper Body Bathing: Minimal assistance;Sitting   Lower Body Bathing: Minimal assistance;Sit to/from stand   Upper Body Dressing : Set up;Sitting   Lower Body Dressing: Minimal assistance;Sit to/from stand   Toilet Transfer: Minimal Holiday representative;Ambulation;Rolling walker (2 wheels)                   Vision Baseline Vision/History: 2 Legally blind Patient Visual Report: No change from  baseline       Perception     Praxis      Pertinent Vitals/Pain Pain Assessment Pain Assessment: No/denies pain     Hand Dominance Right   Extremity/Trunk Assessment Upper Extremity Assessment Upper  Extremity Assessment: Generalized weakness   Lower Extremity Assessment Lower Extremity Assessment: Defer to PT evaluation   Cervical / Trunk Assessment Cervical / Trunk Assessment: Kyphotic   Communication Communication Communication: HOH   Cognition Arousal/Alertness: Awake/alert Behavior During Therapy: WFL for tasks assessed/performed Overall Cognitive Status: History of cognitive impairments - at baseline                                 General Comments: Family stating Dementia is mild.     General Comments   VSS on RA    Exercises     Shoulder Instructions      Home Living Family/patient expects to be discharged to:: Private residence Living Arrangements: Children Available Help at Discharge: Available 24 hours/day Type of Home: House Home Access: Level entry     Home Layout: One level     Bathroom Shower/Tub: Producer, television/film/video: Handicapped height Bathroom Accessibility: Yes How Accessible: Accessible via walker Home Equipment: Agricultural consultant (2 wheels);Shower seat;Grab bars - tub/shower   Additional Comments: lives with son, DIL, grandson.  Family stating patient will accept any and all help offered to him.      Prior Functioning/Environment Prior Level of Function : Independent/Modified Independent;History of Falls (last six months)             Mobility Comments: Used RW at [employer] I ADLs Comments: mod I        OT Problem List: Decreased strength;Decreased activity tolerance;Impaired balance (sitting and/or standing)      OT Treatment/Interventions: Self-care/ADL training;Therapeutic activities;Patient/family education;Balance training;DME and/or AE instruction    OT Goals(Current goals can be found in the care plan section) Acute Rehab OT Goals Patient Stated Goal: Return home OT Goal Formulation: With patient Time For Goal Achievement: 08/18/23 Potential to Achieve Goals: Good ADL Goals Pt Will Perform  Grooming: with set-up;standing Pt Will Perform Lower Body Dressing: with set-up;sit to/from stand Pt Will Transfer to Toilet: with set-up;ambulating;regular height toilet  OT Frequency: Min 1X/week    Co-evaluation              AM-PAC OT "6 Clicks" Daily Activity     Outcome Measure Help from another person eating meals?: None Help from another person taking care of personal grooming?: A Little Help from another person toileting, which includes using toliet, bedpan, or urinal?: A Little Help from another person bathing (including washing, rinsing, drying)?: A Little Help from another person to put on and taking off regular upper body clothing?: None Help from another person to put on and taking off regular lower body clothing?: A Little 6 Click Score: 20   End of Session Equipment Utilized During Treatment: Rolling walker (2 wheels) Nurse Communication: Mobility status  Activity Tolerance: Patient tolerated treatment well Patient left: in chair;with call bell/phone within reach;with chair alarm set;with family/visitor present  OT Visit Diagnosis: Unsteadiness on feet (R26.81);Muscle weakness (generalized) (M62.81);History of falling (Z91.81)                Time: 9147-8295 OT Time Calculation (min): 22 min Charges:  OT General Charges $OT Visit: 1 Visit OT Evaluation $OT Eval Moderate Complexity: 1 Mod  08/04/2023  RP,  OTR/L  Acute Rehabilitation Services  Office:  [phone]   Douglas Walker 08/04/2023, 12:58 PM

## 2023-08-04 NOTE — Plan of Care (Signed)

## 2023-08-04 NOTE — Progress Notes (Signed)
Physical Therapy Treatment Patient Details Name: Douglas Walker MRN: [id number] DOB: [birth year] Today's Date: 08/04/2023   History of Present Illness 89 years old male presented in the ED with fever, confusion, generalized weakness and shaking and admitted 07/30/23 for Septic shock secondary to community-acquired pneumonia /recurrent UTI.  PMH significant for but not limited to: dementia, chronic diastolic heart failure, s/p pacemaker, essential tremor primarily involving the lips/oral musculature, hyperlipidemia, paroxysmal A-fib on Eliquis, pulmonary hypertension, chronic foley catheter, CVA, neuropathy    PT Comments  Pt agreeable to working with therapy with minimal encouragement. He participated well. One instance of unsteadiness during session, otherwise he performed well with use of RW. Per chart review, and reorder from MD, family is now requesting placement.    If plan is discharge home, recommend the following: A little help with walking and/or transfers;A little help with bathing/dressing/bathroom;Help with stairs or ramp for entrance;Assistance with cooking/housework   Can travel by private vehicle     Yes  Equipment Recommendations  None recommended by PT    Recommendations for Other Services       Precautions / Restrictions Precautions Precautions: Fall Precaution Comments: blind right eye, HOH even with hearing aides Restrictions Weight Bearing Restrictions: No     Mobility  Bed Mobility Overal bed mobility: Needs Assistance Bed Mobility: Supine to Sit, Sit to Supine     Supine to sit: Supervision, HOB elevated Sit to supine: Supervision, HOB elevated   General bed mobility comments: Supv for safety. Increased time.    Transfers Overall transfer level: Needs assistance Equipment used: Rolling walker (2 wheels) Transfers: Sit to/from Stand Sit to Stand: Min guard           General transfer comment: Min guard for safety. Increased time.     Ambulation/Gait Ambulation/Gait assistance: Min guard Gait Distance (Feet): 200 Feet Assistive device: Rolling walker (2 wheels) Gait Pattern/deviations: Step-through pattern, Decreased stride length       General Gait Details: Min guard for safety for the most part. Once instance, while turning/changing direction, of requiring a small amount of assist to steady.   Stairs             Wheelchair Mobility     Tilt Bed    Modified Rankin (Stroke Patients Only)       Balance Overall balance assessment: Needs assistance, History of Falls         Standing balance support: Bilateral upper extremity supported, Reliant on assistive device for balance, During functional activity Standing balance-Leahy Scale: Poor                              Cognition Arousal/Alertness: Awake/alert Behavior During Therapy: WFL for tasks assessed/performed Overall Cognitive Status: History of cognitive impairments - at baseline                                 General Comments: Family stating Dementia is mild.        Exercises      General Comments        Pertinent Vitals/Pain Pain Assessment Pain Assessment: No/denies pain    Home Living Family/patient expects to be discharged to:: Private residence Living Arrangements: Children Available Help at Discharge: Available 24 hours/day Type of Home: House Home Access: Level entry       Home Layout: One level Home Equipment: Agricultural consultant (2 wheels);Shower seat;Grab  bars - tub/shower Additional Comments: lives with son, DIL, grandson.  Family stating patient will accept any and all help offered to him.    Prior Function            PT Goals (current goals can now be found in the care plan section) Progress towards PT goals: Progressing toward goals    Frequency    Min 1X/week      PT Plan Current plan remains appropriate    Co-evaluation              AM-PAC PT "6 Clicks"  Mobility   Outcome Measure  Help needed turning from your back to your side while in a flat bed without using bedrails?: A Little Help needed moving from lying on your back to sitting on the side of a flat bed without using bedrails?: A Little Help needed moving to and from a bed to a chair (including a wheelchair)?: A Little Help needed standing up from a chair using your arms (e.g., wheelchair or bedside chair)?: A Little Help needed to walk in hospital room?: A Little Help needed climbing 3-5 steps with a railing? : A Little 6 Click Score: 18    End of Session   Activity Tolerance: Patient tolerated treatment well Patient left: with call bell/phone within reach;with bed alarm set;with family/visitor present   PT Visit Diagnosis: Difficulty in walking, not elsewhere classified (R26.2);Muscle weakness (generalized) (M62.81)     Time: 1610-9604 PT Time Calculation (min) (ACUTE ONLY): 13 min  Charges:    $Gait Training: 8-22 mins PT General Charges $$ ACUTE PT VISIT: 1 Visit                         Faye Ramsay, PT Acute Rehabilitation  Office: [phone]

## 2023-08-04 NOTE — Progress Notes (Signed)
PROGRESS NOTE    Douglas Walker  PXT:[id number] DOB: Jan 24, 1925 DOA: 07/30/2023 PCP: Joya Martyr, MD   Brief Narrative: This 89 years old male with PMH significant for dementia, chronic diastolic heart failure, s/p pacemaker, essential tremor, hyperlipidemia, paroxysmal A-fib on Eliquis, pulmonary hypertension presented in the ED with fever, confusion, generalized weakness and shaking.  Patient also found to have productive cough and hypoxia requiring supplemental oxygen.  Patient was also hypotensive requiring Levophed support.  Patient was admitted in the ICU for sepsis secondary to community-acquired pneumonia  and recurrent UTI.  He was started on empiric antibiotics. TRH pickup 08/01/2023.  Assessment & Plan:   Principal Problem:   Sepsis (HCC) Active Problems:   Septic shock (HCC)  Septic shock sec. to community-acquired pneumonia /recurrent UTI: Patient does have history of multidrug-resistant UTI on chronic Macrobid therapy. Patient was hypotensive requiring Levophed support to keep MAP goal above 65. Lactic acid resolved with IV hydration.   Patient was initiated on meropenem and azithromycin as per ID. Blood pressure has improved, Now off Levophed support. Wean stress dose steroids. Discontinue midodrine as blood pressure has improved. Infectious disease recommended to de-escalate antibiotics to Unasyn.   Meropenem and azithromycin discontinued. Patient can be discharged on Augmentin to complete 7-day antibiotics for possible aspiration pneumonia. Sepsis physiology improving. Legionella antigen, Strep Pneumo negative.  Acute on chronic kidney disease stage IIIa: Baseline Serum creatinine 1.2- 1.3  slightly increased 2.08 Continue IV fluid resuscitation.  Avoid nephrotoxic medications. Serum creatinine improving 2.05 >1.51 >1.43 >  Acute encephalopathy secondary to sepsis: Mental status has been improving. Patient does have history of dementia. As per son  patient at baseline can speak but not talkative from prior stroke.   Obtain CT head to r/o acute change If mental status changes. Speech and swallow eval.  Reorient as able to  Atrial fibrillation: Heart rate is well-controlled.   Patient is started on heparin drip. Heparin switched with Eliquis.  Hypothyroidism: continue levothyroxine.  Chronic pain: Continue Cymbalta and Lyrica.  Dementia: No behavioral problem.  Continue to monitor.   DVT prophylaxis: Heparin IV Code Status: DNR Family Communication: No family at bed side. Disposition Plan:  Status is: Inpatient Remains inpatient appropriate because: Admitted for septic shock secondary to community-acquired pneumonia and UTI.  Started on empiric antibiotics.  PT and OT recommended home health services. Patient's family wants skilled nursing facility as family unable to take care of him at home.    Consultants:  Infectious diseases  Procedures: None Antimicrobials: Anti-infectives (From admission, onward)    Start     Dose/Rate Route Frequency Ordered Stop   08/03/23 1600  Ampicillin-Sulbactam (UNASYN) 3 g in sodium chloride 0.9 % 100 mL IVPB        3 g 200 mL/hr over 30 Minutes Intravenous Every 6 hours 08/03/23 0843 08/05/23 1759   08/01/23 2100  Ampicillin-Sulbactam (UNASYN) 3 g in sodium chloride 0.9 % 100 mL IVPB  Status:  Discontinued        3 g 200 mL/hr over 30 Minutes Intravenous Every 12 hours 08/01/23 1105 08/03/23 0843   07/31/23 0400  cefTRIAXone (ROCEPHIN) 1 g in sodium chloride 0.9 % 100 mL IVPB  Status:  Discontinued        1 g 200 mL/hr over 30 Minutes Intravenous Every 24 hours 07/30/23 0736 07/30/23 0758   07/30/23 1045  ceFEPIme (MAXIPIME) 2 g in sodium chloride 0.9 % 100 mL IVPB  Status:  Discontinued  2 g 200 mL/hr over 30 Minutes Intravenous  Once 07/30/23 1030 07/30/23 1032   07/30/23 1045  metroNIDAZOLE (FLAGYL) IVPB 500 mg  Status:  Discontinued        500 mg 100 mL/hr over 60  Minutes Intravenous  Once 07/30/23 1030 07/30/23 1055   07/30/23 0900  meropenem (MERREM) 500 mg in sodium chloride 0.9 % 100 mL IVPB  Status:  Discontinued        500 mg 200 mL/hr over 30 Minutes Intravenous Every 12 hours 07/30/23 0759 08/01/23 1105   07/30/23 0800  azithromycin (ZITHROMAX) 500 mg in sodium chloride 0.9 % 250 mL IVPB  Status:  Discontinued        500 mg 250 mL/hr over 60 Minutes Intravenous Every 24 hours 07/30/23 0736 08/01/23 1105   07/30/23 0300  vancomycin (VANCOREADY) IVPB 1750 mg/350 mL        1,750 mg 175 mL/hr over 120 Minutes Intravenous  Once 07/30/23 0247 07/30/23 0617   07/30/23 0245  ceFEPIme (MAXIPIME) 2 g in sodium chloride 0.9 % 100 mL IVPB        2 g 200 mL/hr over 30 Minutes Intravenous  Once 07/30/23 0244 07/30/23 0442   07/30/23 0245  metroNIDAZOLE (FLAGYL) IVPB 500 mg        500 mg 100 mL/hr over 60 Minutes Intravenous  Once 07/30/23 0244 07/30/23 0715   07/30/23 0245  vancomycin (VANCOCIN) IVPB 1000 mg/200 mL premix  Status:  Discontinued        1,000 mg 200 mL/hr over 60 Minutes Intravenous  Once 07/30/23 0244 07/30/23 0247      Subjective: Patient was seen and examined at bedside. Overnight events noted. Patient is demented but appears at his baseline mental status. He is following commands. He is hard of hearing. He is oriented to self only.  Appears very deconditioned.   Objective: Vitals:   08/03/23 0500 08/03/23 0620 08/03/23 1333 08/03/23 2055  BP:  136/73 133/65 (!) 141/70  Pulse:  69 69 70  Resp:  16 14 15   Temp:  98.2 F (36.8 C) 98.8 F (37.1 C) 98.9 F (37.2 C)  TempSrc:  Oral Oral Oral  SpO2:  97% 96% 96%  Weight: 90.4 kg     Height:        Intake/Output Summary (Last 24 hours) at 08/04/2023 1035 Last data filed at 08/04/2023 0900 Gross per 24 hour  Intake 1070 ml  Output 1275 ml  Net -205 ml   Filed Weights   08/01/23 0500 08/02/23 0500 08/03/23 0500  Weight: 91.4 kg 92.3 kg 90.4 kg    Examination:  General  exam: Appears deconditioned, not in any acute distress.  Elderly male. Respiratory system: CTA bilaterally. Respiratory effort normal.  RR 14 Cardiovascular system: S1 & S2 heard, irregular rhythm, no murmur. Gastrointestinal system: Abdomen is soft, non tender, non distended, bowel sounds present. Central nervous system: Alert and oriented x 1. No focal neurological deficits. Extremities: No edema, no cyanosis, no clubbing Skin: No rashes, lesions or ulcers Psychiatry:Mood & affect appropriate.     Data Reviewed: I have personally reviewed following labs and imaging studies  CBC: Recent Labs  Lab 07/30/23 0247 07/31/23 0312 08/01/23 0319 08/02/23 0053 08/03/23 0603 08/04/23 0449  WBC 9.6 24.7* 23.0* 15.3* 7.5 7.2  NEUTROABS 9.2*  --   --   --   --   --   HGB 11.4* 10.0* 10.8* 10.3* 10.1* 10.1*  HCT 35.6* 30.9* 32.9* 31.9* 31.4* 30.7*  MCV 93.7 94.5 94.3 92.7 93.2 92.5  PLT 116* 81* 84* 83* 88* 99*   Basic Metabolic Panel: Recent Labs  Lab 07/30/23 0247 07/31/23 0312 08/02/23 0053 08/03/23 0603 08/04/23 0449  NA 135 138 137 139 138  K 3.4* 3.9 3.3* 3.3* 3.4*  CL 98 104 107 107 107  CO2 26 24 23 25 24   GLUCOSE 89 115* 131* 89 105*  BUN 34* 41* 44* 37* 36*  CREATININE 1.82* 2.05* 1.63* 1.51* 1.43*  CALCIUM 9.4 8.5* 8.4* 8.4* 8.5*  MG  --  2.1 2.3 2.2 2.2  PHOS  --  4.2 3.1 2.2* 3.1   GFR: Estimated Creatinine Clearance: 34.4 mL/min (A) (by C-G formula based on SCr of 1.43 mg/dL (H)). Liver Function Tests: Recent Labs  Lab 07/29/23 1445 07/30/23 0247 07/31/23 0312  AST 20 30 24   ALT 19 28 21   ALKPHOS  --  97 67  BILITOT 0.5 0.7 0.9  PROT 7.6 8.4* 6.8  ALBUMIN  --  3.5 2.6*   No results for input(s): "LIPASE", "AMYLASE" in the last 168 hours. Recent Labs  Lab 07/30/23 1204  AMMONIA 12   Coagulation Profile: Recent Labs  Lab 07/30/23 0247  INR 1.2   Cardiac Enzymes: No results for input(s): "CKTOTAL", "CKMB", "CKMBINDEX", "TROPONINI" in the last  168 hours. BNP (last 3 results) Recent Labs    02/04/23 1619  PROBNP 1,402*   HbA1C: No results for input(s): "HGBA1C" in the last 72 hours. CBG: No results for input(s): "GLUCAP" in the last 168 hours. Lipid Profile: No results for input(s): "CHOL", "HDL", "LDLCALC", "TRIG", "CHOLHDL", "LDLDIRECT" in the last 72 hours. Thyroid Function Tests: No results for input(s): "TSH", "T4TOTAL", "FREET4", "T3FREE", "THYROIDAB" in the last 72 hours.  Anemia Panel: No results for input(s): "VITAMINB12", "FOLATE", "FERRITIN", "TIBC", "IRON", "RETICCTPCT" in the last 72 hours. Sepsis Labs: Recent Labs  Lab 07/30/23 0302 07/30/23 0457 07/30/23 1150  LATICACIDVEN 2.4* 2.3* 1.7    Recent Results (from the past 240 hour(s))  Blood Culture (routine x 2)     Status: None (Preliminary result)   Collection Time: 07/30/23  2:47 AM   Specimen: BLOOD  Result Value Ref Range Status   Specimen Description   Final    BLOOD RIGHT ANTECUBITAL Performed at [facility], [address], [place], [place] [address]    Special Requests   Final    BOTTLES DRAWN AEROBIC AND ANAEROBIC Blood Culture results may not be optimal due to an excessive volume of blood received in culture bottles Performed at [facility], [address], [place], [place] [address]    Culture  Setup Time   Final    GRAM NEGATIVE RODS ANAEROBIC BOTTLE ONLY CRITICAL RESULT CALLED TO, READ BACK BY AND VERIFIED WITH: PHARMD MICHELLE BELL ON 08/01/23 @ 1946 BY DRT    Culture   Final    GRAM NEGATIVE RODS CULTURE REINCUBATED FOR BETTER GROWTH Performed at [facility], [address] [place], [place] [address]    Report Status PENDING  Incomplete  Blood Culture ID Panel (Reflexed)     Status: None   Collection Time: 07/30/23  2:47 AM  Result Value Ref Range Status   Enterococcus faecalis NOT DETECTED NOT DETECTED Final   Enterococcus Faecium NOT DETECTED NOT DETECTED Final   Listeria  monocytogenes NOT DETECTED NOT DETECTED Final   Staphylococcus species NOT DETECTED NOT DETECTED Final   Staphylococcus aureus (BCID) NOT DETECTED NOT DETECTED Final  Staphylococcus epidermidis NOT DETECTED NOT DETECTED Final   Staphylococcus lugdunensis NOT DETECTED NOT DETECTED Final   Streptococcus species NOT DETECTED NOT DETECTED Final   Streptococcus agalactiae NOT DETECTED NOT DETECTED Final   Streptococcus pneumoniae NOT DETECTED NOT DETECTED Final   Streptococcus pyogenes NOT DETECTED NOT DETECTED Final   A.calcoaceticus-baumannii NOT DETECTED NOT DETECTED Final   Bacteroides fragilis NOT DETECTED NOT DETECTED Final   Enterobacterales NOT DETECTED NOT DETECTED Final   Enterobacter cloacae complex NOT DETECTED NOT DETECTED Final   Escherichia coli NOT DETECTED NOT DETECTED Final   Klebsiella aerogenes NOT DETECTED NOT DETECTED Final   Klebsiella oxytoca NOT DETECTED NOT DETECTED Final   Klebsiella pneumoniae NOT DETECTED NOT DETECTED Final   Proteus species NOT DETECTED NOT DETECTED Final   Salmonella species NOT DETECTED NOT DETECTED Final   Serratia marcescens NOT DETECTED NOT DETECTED Final   Haemophilus influenzae NOT DETECTED NOT DETECTED Final   Neisseria meningitidis NOT DETECTED NOT DETECTED Final   Pseudomonas aeruginosa NOT DETECTED NOT DETECTED Final   Stenotrophomonas maltophilia NOT DETECTED NOT DETECTED Final   Candida albicans NOT DETECTED NOT DETECTED Final   Candida auris NOT DETECTED NOT DETECTED Final   Candida glabrata NOT DETECTED NOT DETECTED Final   Candida krusei NOT DETECTED NOT DETECTED Final   Candida parapsilosis NOT DETECTED NOT DETECTED Final   Candida tropicalis NOT DETECTED NOT DETECTED Final   Cryptococcus neoformans/gattii NOT DETECTED NOT DETECTED Final    Comment: Performed at [facility], [address], [place], [place] [address]  Resp panel by RT-PCR (RSV, Flu A&B, Covid) Anterior Nasal Swab     Status: None   Collection Time:  07/30/23  2:54 AM   Specimen: Anterior Nasal Swab  Result Value Ref Range Status   SARS Coronavirus 2 by RT PCR NEGATIVE NEGATIVE Final    Comment: (NOTE) SARS-CoV-2 target nucleic acids are NOT DETECTED.  The SARS-CoV-2 RNA is generally detectable in upper respiratory specimens during the acute phase of infection. The lowest concentration of SARS-CoV-2 viral copies this assay can detect is 138 copies/mL. A negative result does not preclude SARS-Cov-2 infection and should not be used as the sole basis for treatment or other patient management decisions. A negative result may occur with  improper specimen collection/handling, submission of specimen other than nasopharyngeal swab, presence of viral mutation(s) within the areas targeted by this assay, and inadequate number of viral copies(<138 copies/mL). A negative result must be combined with clinical observations, patient history, and epidemiological information. The expected result is Negative.  Fact Sheet for Patients:  BloggerCourse.com  Fact Sheet for Healthcare Providers:  SeriousBroker.it  This test is no t yet approved or cleared by the [place] FDA and  has been authorized for detection and/or diagnosis of SARS-CoV-2 by FDA under an Emergency Use Authorization (EUA). This EUA will remain  in effect (meaning this test can be used) for the duration of the COVID-19 declaration under Section 564(b)(1) of the Act, 21 U.S.C.section 360bbb-3(b)(1), unless the authorization is terminated  or revoked sooner.       Influenza A by PCR NEGATIVE NEGATIVE Final   Influenza B by PCR NEGATIVE NEGATIVE Final    Comment: (NOTE) The Xpert Xpress SARS-CoV-2/FLU/RSV plus assay is intended as an aid in the diagnosis of influenza from Nasopharyngeal swab specimens and should not be used as a sole basis for treatment. Nasal washings and aspirates are unacceptable for Xpert Xpress  SARS-CoV-2/FLU/RSV testing.  Fact Sheet for Patients: BloggerCourse.com  Fact Sheet for Healthcare Providers: SeriousBroker.it  This test is not yet approved or cleared by the [place] FDA and has been authorized for detection and/or diagnosis of SARS-CoV-2 by FDA under an Emergency Use Authorization (EUA). This EUA will remain in effect (meaning this test can be used) for the duration of the COVID-19 declaration under Section 564(b)(1) of the Act, 21 U.S.C. section 360bbb-3(b)(1), unless the authorization is terminated or revoked.     Resp Syncytial Virus by PCR NEGATIVE NEGATIVE Final    Comment: (NOTE) Fact Sheet for Patients: BloggerCourse.com  Fact Sheet for Healthcare Providers: SeriousBroker.it  This test is not yet approved or cleared by the [place] FDA and has been authorized for detection and/or diagnosis of SARS-CoV-2 by FDA under an Emergency Use Authorization (EUA). This EUA will remain in effect (meaning this test can be used) for the duration of the COVID-19 declaration under Section 564(b)(1) of the Act, 21 U.S.C. section 360bbb-3(b)(1), unless the authorization is terminated or revoked.  Performed at [facility], [address] [place], [place] [address]   Blood Culture (routine x 2)     Status: None   Collection Time: 07/30/23  3:37 AM   Specimen: BLOOD  Result Value Ref Range Status   Specimen Description   Final    BLOOD BLOOD RIGHT FOREARM Performed at [facility], [address], [place], [place] [address]    Special Requests   Final    BOTTLES DRAWN AEROBIC AND ANAEROBIC Blood Culture results may not be optimal due to an excessive volume of blood received in culture bottles Performed at [facility], [address] [place], [place] [address]    Culture   Final    NO GROWTH 5  DAYS Performed at [facility], [address] [place], [place] [address]    Report Status 08/04/2023 FINAL  Final  Urine Culture     Status: Abnormal   Collection Time: 07/30/23  4:17 AM   Specimen: Urine, Random  Result Value Ref Range Status   Specimen Description   Final    URINE, RANDOM Performed at [facility], [address], [place], [place] [address]    Special Requests   Final    NONE Reflexed from [id number] Performed at [facility], [address] [place], [place] [address]    Culture >=100,000 COLONIES/mL PROTEUS MIRABILIS (A)  Final   Report Status 08/01/2023 FINAL  Final   Organism ID, Bacteria PROTEUS MIRABILIS (A)  Final      Susceptibility   Proteus mirabilis - MIC*    AMPICILLIN <=2 SENSITIVE Sensitive     CEFAZOLIN >=64 RESISTANT Resistant     CEFEPIME <=0.12 SENSITIVE Sensitive     CEFTRIAXONE 32 RESISTANT Resistant     CIPROFLOXACIN <=0.25 SENSITIVE Sensitive     GENTAMICIN <=1 SENSITIVE Sensitive     IMIPENEM 1 SENSITIVE Sensitive     NITROFURANTOIN 64 INTERMEDIATE Intermediate     TRIMETH/SULFA <=20 SENSITIVE Sensitive     AMPICILLIN/SULBACTAM <=2 SENSITIVE Sensitive     PIP/TAZO <=4 SENSITIVE Sensitive     * >=100,000 COLONIES/mL PROTEUS MIRABILIS  MRSA Next Gen by PCR, Nasal     Status: None   Collection Time: 07/30/23  5:12 AM   Specimen: Nasal Mucosa; Nasal Swab  Result Value Ref Range Status   MRSA by PCR Next Gen NOT DETECTED NOT DETECTED Final    Comment: (NOTE) The GeneXpert MRSA Assay (  FDA approved for NASAL specimens only), is one component of a comprehensive MRSA colonization surveillance program. It is not intended to diagnose MRSA infection nor to guide or monitor treatment for MRSA infections. Test performance is not FDA approved in patients less than 59 years old. Performed at [facility], [address] [place], [place] [address]     Radiology Studies: No results  found.  Scheduled Meds:  apixaban  2.5 mg Oral BID   Chlorhexidine Gluconate Cloth  6 each Topical Daily   DULoxetine  60 mg Oral Daily   [START ON 08/05/2023] hydrocortisone sod succinate (SOLU-CORTEF) inj  50 mg Intravenous Daily   levothyroxine  25 mcg Oral QAC breakfast   loratadine  10 mg Oral Daily   potassium chloride  40 mEq Oral Once   pregabalin  50 mg Oral QHS   Continuous Infusions:  sodium chloride Stopped (07/30/23 2325)   ampicillin-sulbactam (UNASYN) IV 3 g (08/04/23 0525)     LOS: 5 days    Time spent: 35 mins    Willeen Niece, MD [facility]   If 7PM-7AM, please contact night-coverage

## 2023-08-05 DIAGNOSIS — R6520 Severe sepsis without septic shock: Secondary | ICD-10-CM | POA: Diagnosis not present

## 2023-08-05 DIAGNOSIS — A419 Sepsis, unspecified organism: Secondary | ICD-10-CM | POA: Diagnosis not present

## 2023-08-05 DIAGNOSIS — N179 Acute kidney failure, unspecified: Secondary | ICD-10-CM | POA: Diagnosis not present

## 2023-08-05 MED ORDER — POTASSIUM CHLORIDE CRYS ER 20 MEQ PO TBCR
40.0000 meq | EXTENDED_RELEASE_TABLET | Freq: Once | ORAL | Status: AC
  Administered 2023-08-05: 40 meq via ORAL
  Filled 2023-08-05: qty 2

## 2023-08-05 MED ORDER — HYDROCORTISONE SOD SUC (PF) 100 MG IJ SOLR
25.0000 mg | Freq: Every day | INTRAMUSCULAR | Status: DC
  Administered 2023-08-06 – 2023-08-10 (×5): 25 mg via INTRAVENOUS
  Filled 2023-08-05 (×5): qty 0.5

## 2023-08-05 MED ORDER — POTASSIUM CHLORIDE 20 MEQ PO PACK: 40.0000 meq | PACK | Freq: Once | ORAL | Status: DC

## 2023-08-05 NOTE — Progress Notes (Signed)
PROGRESS NOTE    Douglas Walker  EAV:[id number] DOB: 04-27-1925 DOA: 07/30/2023 PCP: Joya Martyr, MD   Brief Narrative: This 89 years old male with PMH significant for dementia, chronic diastolic heart failure, s/p pacemaker, essential tremor, hyperlipidemia, paroxysmal A-fib on Eliquis, pulmonary hypertension presented in the ED with fever, confusion, generalized weakness and shaking.  Patient also found to have productive cough and hypoxia requiring supplemental oxygen.  Patient was also hypotensive requiring Levophed support.  Patient was admitted in the ICU for sepsis secondary to community-acquired pneumonia  and recurrent UTI.  He was started on empiric antibiotics. Now off Levophed. TRH pickup 08/01/2023.  Assessment & Plan:   Principal Problem:   Sepsis (HCC) Active Problems:   Septic shock (HCC)  Septic shock sec. to community-acquired pneumonia /recurrent UTI: Patient does have history of multidrug-resistant UTI on chronic Macrobid therapy. Patient was hypotensive requiring Levophed support to keep MAP goal above 65. Lactic acid resolved with IV hydration.   Patient was initiated on meropenem and azithromycin as per ID. Blood pressure has improved, Now off Levophed support. Wean stress dose steroids. DC Solucortef tomorrow. Discontinue midodrine as blood pressure has improved. Infectious disease recommended to de-escalate antibiotics to Unasyn.   Meropenem and azithromycin discontinued. Patient can be discharged on Augmentin to complete 7-day antibiotics for possible aspiration pneumonia. Sepsis physiology improving. Legionella antigen, Strep Pneumo negative.  Acute on chronic kidney disease stage IIIa: Baseline Serum creatinine 1.2- 1.3  slightly increased 2.08 Continue IV fluid resuscitation.  Avoid nephrotoxic medications. Serum creatinine improving 2.05 >1.51 >1.43 >  Acute encephalopathy secondary to sepsis: Mental status has improved. Patient does have  history of dementia. As per son patient at baseline can speak but less talkative from prior stroke.   Obtain CT head to r/o acute change If mental status changes. Speech and swallow eval recommended nectar thick liquids.  Reorient as able to.  Atrial fibrillation: Heart rate is well-controlled.   Continue Eliquis.  Hypothyroidism: continue levothyroxine.  Chronic pain: Continue Cymbalta and Lyrica.  Dementia: No behavioral problem.  Continue to monitor.   DVT prophylaxis: Heparin IV Code Status: DNR Family Communication: No family at bed side. Disposition Plan:  Status is: Inpatient Remains inpatient appropriate because: Admitted for septic shock secondary to community-acquired pneumonia and UTI.  Started on empiric antibiotics.  PT and OT recommended home health services. Patient's family wants skilled nursing facility as family unable to take care of him at home. Medically clear,  awaiting SNF placement.  Consultants:  Infectious diseases  Procedures: None Antimicrobials: Anti-infectives (From admission, onward)    Start     Dose/Rate Route Frequency Ordered Stop   08/03/23 1600  Ampicillin-Sulbactam (UNASYN) 3 g in sodium chloride 0.9 % 100 mL IVPB        3 g 200 mL/hr over 30 Minutes Intravenous Every 6 hours 08/03/23 0843 08/05/23 1759   08/01/23 2100  Ampicillin-Sulbactam (UNASYN) 3 g in sodium chloride 0.9 % 100 mL IVPB  Status:  Discontinued        3 g 200 mL/hr over 30 Minutes Intravenous Every 12 hours 08/01/23 1105 08/03/23 0843   07/31/23 0400  cefTRIAXone (ROCEPHIN) 1 g in sodium chloride 0.9 % 100 mL IVPB  Status:  Discontinued        1 g 200 mL/hr over 30 Minutes Intravenous Every 24 hours 07/30/23 0736 07/30/23 0758   07/30/23 1045  ceFEPIme (MAXIPIME) 2 g in sodium chloride 0.9 % 100 mL IVPB  Status:  Discontinued  2 g 200 mL/hr over 30 Minutes Intravenous  Once 07/30/23 1030 07/30/23 1032   07/30/23 1045  metroNIDAZOLE (FLAGYL) IVPB 500 mg   Status:  Discontinued        500 mg 100 mL/hr over 60 Minutes Intravenous  Once 07/30/23 1030 07/30/23 1055   07/30/23 0900  meropenem (MERREM) 500 mg in sodium chloride 0.9 % 100 mL IVPB  Status:  Discontinued        500 mg 200 mL/hr over 30 Minutes Intravenous Every 12 hours 07/30/23 0759 08/01/23 1105   07/30/23 0800  azithromycin (ZITHROMAX) 500 mg in sodium chloride 0.9 % 250 mL IVPB  Status:  Discontinued        500 mg 250 mL/hr over 60 Minutes Intravenous Every 24 hours 07/30/23 0736 08/01/23 1105   07/30/23 0300  vancomycin (VANCOREADY) IVPB 1750 mg/350 mL        1,750 mg 175 mL/hr over 120 Minutes Intravenous  Once 07/30/23 0247 07/30/23 0617   07/30/23 0245  ceFEPIme (MAXIPIME) 2 g in sodium chloride 0.9 % 100 mL IVPB        2 g 200 mL/hr over 30 Minutes Intravenous  Once 07/30/23 0244 07/30/23 0442   07/30/23 0245  metroNIDAZOLE (FLAGYL) IVPB 500 mg        500 mg 100 mL/hr over 60 Minutes Intravenous  Once 07/30/23 0244 07/30/23 0715   07/30/23 0245  vancomycin (VANCOCIN) IVPB 1000 mg/200 mL premix  Status:  Discontinued        1,000 mg 200 mL/hr over 60 Minutes Intravenous  Once 07/30/23 0244 07/30/23 0247      Subjective: Patient was seen and examined at bedside. Overnight events noted. Patient is demented but appears at his baseline mental status. He is following commands. He is hard of hearing. He is oriented to self only.  Appears very deconditioned.   Objective: Vitals:   08/04/23 1148 08/04/23 1934 08/05/23 0319 08/05/23 0500  BP: (!) 164/78 131/71 (!) 166/82   Pulse: 69 70 69   Resp: 20 16 14    Temp: 98 F (36.7 C) 99 F (37.2 C) 98.1 F (36.7 C)   TempSrc: Oral Oral Oral   SpO2: 96% 94% 92%   Weight:    90.9 kg  Height:        Intake/Output Summary (Last 24 hours) at 08/05/2023 1059 Last data filed at 08/05/2023 0900 Gross per 24 hour  Intake 960 ml  Output 1450 ml  Net -490 ml   Filed Weights   08/02/23 0500 08/03/23 0500 08/05/23 0500  Weight:  92.3 kg 90.4 kg 90.9 kg    Examination:  General exam: Appears deconditioned, elderly male, not in any acute distress. Respiratory system: CTA bilaterally. Respiratory effort normal.  RR 15. Cardiovascular system: S1 & S2 heard, irregular rhythm, no murmur. Gastrointestinal system: Abdomen is soft, non tender, non distended, bowel sounds present. Central nervous system: Alert and oriented x 1. No focal neurological deficits. Extremities: No edema, no cyanosis, no clubbing Skin: No rashes, lesions or ulcers Psychiatry:Mood & affect appropriate.     Data Reviewed: I have personally reviewed following labs and imaging studies  CBC: Recent Labs  Lab 07/30/23 0247 07/31/23 0312 08/01/23 0319 08/02/23 0053 08/03/23 0603 08/04/23 0449  WBC 9.6 24.7* 23.0* 15.3* 7.5 7.2  NEUTROABS 9.2*  --   --   --   --   --   HGB 11.4* 10.0* 10.8* 10.3* 10.1* 10.1*  HCT 35.6* 30.9* 32.9* 31.9* 31.4* 30.7*  MCV 93.7 94.5 94.3 92.7 93.2 92.5  PLT 116* 81* 84* 83* 88* 99*   Basic Metabolic Panel: Recent Labs  Lab 07/30/23 0247 07/31/23 0312 08/02/23 0053 08/03/23 0603 08/04/23 0449  NA 135 138 137 139 138  K 3.4* 3.9 3.3* 3.3* 3.4*  CL 98 104 107 107 107  CO2 26 24 23 25 24   GLUCOSE 89 115* 131* 89 105*  BUN 34* 41* 44* 37* 36*  CREATININE 1.82* 2.05* 1.63* 1.51* 1.43*  CALCIUM 9.4 8.5* 8.4* 8.4* 8.5*  MG  --  2.1 2.3 2.2 2.2  PHOS  --  4.2 3.1 2.2* 3.1   GFR: Estimated Creatinine Clearance: 34.4 mL/min (A) (by C-G formula based on SCr of 1.43 mg/dL (H)). Liver Function Tests: Recent Labs  Lab 07/29/23 1445 07/30/23 0247 07/31/23 0312  AST 20 30 24   ALT 19 28 21   ALKPHOS  --  97 67  BILITOT 0.5 0.7 0.9  PROT 7.6 8.4* 6.8  ALBUMIN  --  3.5 2.6*   No results for input(s): "LIPASE", "AMYLASE" in the last 168 hours. Recent Labs  Lab 07/30/23 1204  AMMONIA 12   Coagulation Profile: Recent Labs  Lab 07/30/23 0247  INR 1.2   Cardiac Enzymes: No results for input(s):  "CKTOTAL", "CKMB", "CKMBINDEX", "TROPONINI" in the last 168 hours. BNP (last 3 results) Recent Labs    02/04/23 1619  PROBNP 1,402*   HbA1C: No results for input(s): "HGBA1C" in the last 72 hours. CBG: No results for input(s): "GLUCAP" in the last 168 hours. Lipid Profile: No results for input(s): "CHOL", "HDL", "LDLCALC", "TRIG", "CHOLHDL", "LDLDIRECT" in the last 72 hours. Thyroid Function Tests: No results for input(s): "TSH", "T4TOTAL", "FREET4", "T3FREE", "THYROIDAB" in the last 72 hours.  Anemia Panel: No results for input(s): "VITAMINB12", "FOLATE", "FERRITIN", "TIBC", "IRON", "RETICCTPCT" in the last 72 hours. Sepsis Labs: Recent Labs  Lab 07/30/23 0302 07/30/23 0457 07/30/23 1150  LATICACIDVEN 2.4* 2.3* 1.7    Recent Results (from the past 240 hour(s))  Blood Culture (routine x 2)     Status: Abnormal   Collection Time: 07/30/23  2:47 AM   Specimen: BLOOD  Result Value Ref Range Status   Specimen Description   Final    BLOOD RIGHT ANTECUBITAL Performed at [facility], [address] [place], [place] [address]    Special Requests   Final    BOTTLES DRAWN AEROBIC AND ANAEROBIC Blood Culture results may not be optimal due to an excessive volume of blood received in culture bottles Performed at [facility], [address], [place], [place] [address]    Culture  Setup Time   Final    GRAM NEGATIVE RODS ANAEROBIC BOTTLE ONLY CRITICAL RESULT CALLED TO, READ BACK BY AND VERIFIED WITH: PHARMD MICHELLE BELL ON 08/01/23 @ 1946 BY DRT    Culture (A)  Final    BACTEROIDES THETAIOTAOMICRON BETA LACTAMASE POSITIVE Performed at [facility], [address] [place], [place] [address]    Report Status 08/04/2023 FINAL  Final  Blood Culture ID Panel (Reflexed)     Status: None   Collection Time: 07/30/23  2:47 AM  Result Value Ref Range Status   Enterococcus faecalis NOT DETECTED NOT DETECTED Final   Enterococcus Faecium NOT  DETECTED NOT DETECTED Final   Listeria monocytogenes NOT DETECTED NOT DETECTED Final   Staphylococcus species NOT DETECTED NOT DETECTED Final   Staphylococcus aureus (BCID) NOT DETECTED NOT DETECTED Final   Staphylococcus epidermidis NOT  DETECTED NOT DETECTED Final   Staphylococcus lugdunensis NOT DETECTED NOT DETECTED Final   Streptococcus species NOT DETECTED NOT DETECTED Final   Streptococcus agalactiae NOT DETECTED NOT DETECTED Final   Streptococcus pneumoniae NOT DETECTED NOT DETECTED Final   Streptococcus pyogenes NOT DETECTED NOT DETECTED Final   A.calcoaceticus-baumannii NOT DETECTED NOT DETECTED Final   Bacteroides fragilis NOT DETECTED NOT DETECTED Final   Enterobacterales NOT DETECTED NOT DETECTED Final   Enterobacter cloacae complex NOT DETECTED NOT DETECTED Final   Escherichia coli NOT DETECTED NOT DETECTED Final   Klebsiella aerogenes NOT DETECTED NOT DETECTED Final   Klebsiella oxytoca NOT DETECTED NOT DETECTED Final   Klebsiella pneumoniae NOT DETECTED NOT DETECTED Final   Proteus species NOT DETECTED NOT DETECTED Final   Salmonella species NOT DETECTED NOT DETECTED Final   Serratia marcescens NOT DETECTED NOT DETECTED Final   Haemophilus influenzae NOT DETECTED NOT DETECTED Final   Neisseria meningitidis NOT DETECTED NOT DETECTED Final   Pseudomonas aeruginosa NOT DETECTED NOT DETECTED Final   Stenotrophomonas maltophilia NOT DETECTED NOT DETECTED Final   Candida albicans NOT DETECTED NOT DETECTED Final   Candida auris NOT DETECTED NOT DETECTED Final   Candida glabrata NOT DETECTED NOT DETECTED Final   Candida krusei NOT DETECTED NOT DETECTED Final   Candida parapsilosis NOT DETECTED NOT DETECTED Final   Candida tropicalis NOT DETECTED NOT DETECTED Final   Cryptococcus neoformans/gattii NOT DETECTED NOT DETECTED Final    Comment: Performed at [facility], [address], [place], [place] [address]  Resp panel by RT-PCR (RSV, Flu A&B, Covid) Anterior Nasal Swab      Status: None   Collection Time: 07/30/23  2:54 AM   Specimen: Anterior Nasal Swab  Result Value Ref Range Status   SARS Coronavirus 2 by RT PCR NEGATIVE NEGATIVE Final    Comment: (NOTE) SARS-CoV-2 target nucleic acids are NOT DETECTED.  The SARS-CoV-2 RNA is generally detectable in upper respiratory specimens during the acute phase of infection. The lowest concentration of SARS-CoV-2 viral copies this assay can detect is 138 copies/mL. A negative result does not preclude SARS-Cov-2 infection and should not be used as the sole basis for treatment or other patient management decisions. A negative result may occur with  improper specimen collection/handling, submission of specimen other than nasopharyngeal swab, presence of viral mutation(s) within the areas targeted by this assay, and inadequate number of viral copies(<138 copies/mL). A negative result must be combined with clinical observations, patient history, and epidemiological information. The expected result is Negative.  Fact Sheet for Patients:  BloggerCourse.com  Fact Sheet for Healthcare Providers:  SeriousBroker.it  This test is no t yet approved or cleared by the [place] FDA and  has been authorized for detection and/or diagnosis of SARS-CoV-2 by FDA under an Emergency Use Authorization (EUA). This EUA will remain  in effect (meaning this test can be used) for the duration of the COVID-19 declaration under Section 564(b)(1) of the Act, 21 U.S.C.section 360bbb-3(b)(1), unless the authorization is terminated  or revoked sooner.       Influenza A by PCR NEGATIVE NEGATIVE Final   Influenza B by PCR NEGATIVE NEGATIVE Final    Comment: (NOTE) The Xpert Xpress SARS-CoV-2/FLU/RSV plus assay is intended as an aid in the diagnosis of influenza from Nasopharyngeal swab specimens and should not be used as a sole basis for treatment. Nasal washings and aspirates are  unacceptable for Xpert Xpress SARS-CoV-2/FLU/RSV testing.  Fact Sheet for Patients: BloggerCourse.com  Fact Sheet  for Healthcare Providers: SeriousBroker.it  This test is not yet approved or cleared by the [place] and has been authorized for detection and/or diagnosis of SARS-CoV-2 by FDA under an Emergency Use Authorization (EUA). This EUA will remain in effect (meaning this test can be used) for the duration of the COVID-19 declaration under Section 564(b)(1) of the Act, 21 U.S.C. section 360bbb-3(b)(1), unless the authorization is terminated or revoked.     Resp Syncytial Virus by PCR NEGATIVE NEGATIVE Final    Comment: (NOTE) Fact Sheet for Patients: BloggerCourse.com  Fact Sheet for Healthcare Providers: SeriousBroker.it  This test is not yet approved or cleared by the [place] FDA and has been authorized for detection and/or diagnosis of SARS-CoV-2 by FDA under an Emergency Use Authorization (EUA). This EUA will remain in effect (meaning this test can be used) for the duration of the COVID-19 declaration under Section 564(b)(1) of the Act, 21 U.S.C. section 360bbb-3(b)(1), unless the authorization is terminated or revoked.  Performed at [facility], [address], [place], [place] [address]   Blood Culture (routine x 2)     Status: None   Collection Time: 07/30/23  3:37 AM   Specimen: BLOOD  Result Value Ref Range Status   Specimen Description   Final    BLOOD BLOOD RIGHT FOREARM Performed at [facility], [address] [place], [place] [address]    Special Requests   Final    BOTTLES DRAWN AEROBIC AND ANAEROBIC Blood Culture results may not be optimal due to an excessive volume of blood received in culture bottles Performed at [facility], [address] [place], [place] [address]     Culture   Final    NO GROWTH 5 DAYS Performed at [facility], [address] [place], [place] [address]    Report Status 08/04/2023 FINAL  Final  Urine Culture     Status: Abnormal   Collection Time: 07/30/23  4:17 AM   Specimen: Urine, Random  Result Value Ref Range Status   Specimen Description   Final    URINE, RANDOM Performed at [facility], [address] [place], [place] [address]    Special Requests   Final    NONE Reflexed from [id number] Performed at [facility], [address], [place], [place] [address]    Culture >=100,000 COLONIES/mL PROTEUS MIRABILIS (A)  Final   Report Status 08/01/2023 FINAL  Final   Organism ID, Bacteria PROTEUS MIRABILIS (A)  Final      Susceptibility   Proteus mirabilis - MIC*    AMPICILLIN <=2 SENSITIVE Sensitive     CEFAZOLIN >=64 RESISTANT Resistant     CEFEPIME <=0.12 SENSITIVE Sensitive     CEFTRIAXONE 32 RESISTANT Resistant     CIPROFLOXACIN <=0.25 SENSITIVE Sensitive     GENTAMICIN <=1 SENSITIVE Sensitive     IMIPENEM 1 SENSITIVE Sensitive     NITROFURANTOIN 64 INTERMEDIATE Intermediate     TRIMETH/SULFA <=20 SENSITIVE Sensitive     AMPICILLIN/SULBACTAM <=2 SENSITIVE Sensitive     PIP/TAZO <=4 SENSITIVE Sensitive     * >=100,000 COLONIES/mL PROTEUS MIRABILIS  MRSA Next Gen by PCR, Nasal     Status: None   Collection Time: 07/30/23  5:12 AM   Specimen: Nasal Mucosa; Nasal Swab  Result Value Ref Range Status   MRSA by PCR Next Gen NOT DETECTED NOT DETECTED Final    Comment: (NOTE) The GeneXpert MRSA Assay (FDA approved  for NASAL specimens only), is one component of a comprehensive MRSA colonization surveillance program. It is not intended to diagnose MRSA infection nor to guide or monitor treatment for MRSA infections. Test performance is not FDA approved in patients less than 66 years old. Performed at [facility], [address] [place], [place] [address]      Radiology Studies: No results found.  Scheduled Meds:  apixaban  2.5 mg Oral BID   Chlorhexidine Gluconate Cloth  6 each Topical Daily   DULoxetine  60 mg Oral Daily   [START ON 08/06/2023] hydrocortisone sod succinate (SOLU-CORTEF) inj  25 mg Intravenous Daily   levothyroxine  25 mcg Oral QAC breakfast   loratadine  10 mg Oral Daily   pregabalin  50 mg Oral QHS   Continuous Infusions:  sodium chloride Stopped (07/30/23 2325)   ampicillin-sulbactam (UNASYN) IV 3 g (08/05/23 0519)     LOS: 6 days    Time spent: 35 mins    Willeen Niece, MD [facility]   If 7PM-7AM, please contact night-coverage

## 2023-08-05 NOTE — Progress Notes (Signed)
Mobility Specialist - Progress Note   08/05/23 1506  Mobility  Activity Ambulated with assistance in hallway  Level of Assistance Contact guard assist, steadying assist  Assistive Device Front wheel walker  Distance Ambulated (ft) 250 ft  Range of Motion/Exercises Active  Activity Response Tolerated well  Mobility Referral Yes  $Mobility charge 1 Mobility  Mobility Specialist Start Time (ACUTE ONLY) 1451  Mobility Specialist Stop Time (ACUTE ONLY) 1506  Mobility Specialist Time Calculation (min) (ACUTE ONLY) 15 min   Pt was found in bed wanting to ambulate. No complaints with session. At EOS returned to bed with all needs met. Call bell in reach.  Billey Chang Mobility Specialist

## 2023-08-05 NOTE — Progress Notes (Signed)
   08/05/23 1653  Integumentary  Skin Integrity Rash  Rash Location Back;Face (right side of face, entire back)  Rash Location Orientation Right;Left;Bilateral  Provider Notified (First and Last Name) Dr. Idelle Leech

## 2023-08-05 NOTE — [place] FL2 (Signed)
[place] MEDICAID FL2 LEVEL OF CARE FORM     IDENTIFICATION  Patient Name: Douglas Walker Birthdate: [birth year] Sex: male Admission Date (Current Location): 07/30/2023  [facility] [employer] and [place] Number:  Producer, television/film/video and Address:  [facility],  [address], [place] [address]      Provider Number: [id number]  Attending Physician Name and Address:  Willeen Niece, MD  Relative Name and Phone Number:  [place]) [phone]    Current Level of Care: Hospital Recommended Level of Care: Skilled Nursing Facility Prior Approval Number:    Date Approved/Denied:   PASRR Number: [id number]  Discharge Plan: SNF    Current Diagnoses: Patient Active Problem List   Diagnosis Date Noted   Sepsis (HCC) 07/30/2023   Septic shock (HCC) 07/30/2023   Aneurysm of ascending aorta without rupture (HCC) 02/04/2023   Coronary artery calcification 02/04/2023   Aortic atherosclerosis (HCC) 02/04/2023   Hematuria 01/02/2023   FTT (failure to thrive) in adult 01/02/2023   Severe sepsis (HCC) 10/13/2022   Nausea and vomiting 10/13/2022   Normocytic anemia 10/13/2022   AAA (abdominal aortic aneurysm) (HCC) 10/13/2022   Physical deconditioning 08/29/2022   Eye pain, right 08/10/2022   Blind painful right eye 08/08/2022   Vitreous hemorrhage of right eye (HCC) 08/05/2022   Choroidal hemorrhage, right 08/05/2022   Secondary glaucoma due to combination mechanisms, right, moderate stage 08/05/2022   History of corneal transplant 05/16/2022   Chronic indwelling Foley catheter 01/31/2022   Benign prostatic hyperplasia with lower urinary tract symptoms 01/31/2022   Medication monitoring encounter 01/31/2022   Multiple drug resistant organism (MDRO) culture positive 01/31/2022   Bacteremia due to Proteus species 10/11/2021   Complicated UTI (urinary tract infection) 07/02/2021   Pain in both testicles    Right hemiparesis (HCC) 04/23/2021   Cerebrovascular  accident (CVA) due to embolism of left middle cerebral artery (HCC) 04/23/2021   Neuropathy 04/23/2021   Abnormality of gait 04/23/2021   Abnormal results of thyroid function studies 04/04/2021   Acquired iron deficiency anemia due to decreased absorption 04/04/2021   Acquired thrombophilia (HCC) 04/04/2021   Cerebral atherosclerosis 04/04/2021   Basal cell carcinoma of nose 04/04/2021   Benign hypertensive heart and renal disease, with heart and renal failure (HCC) 04/04/2021   Benign prostatic hyperplasia 04/04/2021   Obesity 04/04/2021   Bradycardia 04/04/2021   Closed fracture of one rib 04/04/2021   Constipation by delayed colonic transit 04/04/2021   Fatigue 04/04/2021   Gastro-esophageal reflux disease without esophagitis 04/04/2021   Hypothyroidism 04/04/2021   Left lower quadrant pain 04/04/2021   Lumbar radiculopathy 04/04/2021   Reactive depression 04/04/2021   Mass of chest wall 04/04/2021   Mixed hyperlipidemia 04/04/2021   Nocturia 04/04/2021   Other shoulder lesions, right shoulder 04/04/2021   Pain in limb 04/04/2021   Peripheral edema 04/04/2021   Personal history of malignant neoplasm of bladder 04/04/2021   Pruritus of genitalia 04/04/2021   Tremor 04/04/2021   Unilateral primary osteoarthritis, left knee 04/04/2021   Unspecified mononeuropathy of unspecified lower limb 04/04/2021   Urinary incontinence 04/04/2021   Dysphagia, post-stroke    Chronic diastolic congestive heart failure (HCC)    Chronic pain syndrome    Left middle cerebral artery stroke (HCC) 02/01/2021   Pacemaker 08/30/2020   Hematoma, chest wall 07/24/2020   Exudative age-related macular degeneration of right eye with inactive choroidal neovascularization (HCC) 05/10/2020   Exudative age-related macular degeneration of left eye with inactive choroidal neovascularization (  HCC) 05/10/2020   Retinal hemorrhage of right eye 05/10/2020   Exposure keratopathy, bilateral 05/10/2020   Bilateral  dry eyes 05/10/2020   Advanced nonexudative age-related macular degeneration of both eyes with subfoveal involvement 05/10/2020   Complete heart block (HCC) 12/04/2018   Chronic low back pain 12/15/2017   Hereditary and idiopathic peripheral neuropathy 07/01/2016   Essential tremor 02/28/2016   BPPV (benign paroxysmal positional vertigo) 06/19/2015   Dizziness 06/18/2015   Stage 3a chronic kidney disease (CKD) (HCC) 06/18/2015   Chronic diastolic CHF (congestive heart failure) (HCC) 05/30/2015   Fall    Syncope 05/19/2015   Lower urinary tract infectious disease 05/19/2015   Hypotension 05/19/2015   Fracture of rib of left side 05/19/2015   Syncope and collapse 05/19/2015   Mobitz type II atrioventricular block 09/13/2014   Essential hypertension 09/13/2014   Chronic anticoagulation 09/13/2014   SSS (sick sinus syndrome) (HCC) 09/13/2014   Paroxysmal atrial fibrillation (HCC) 10/28/2013   Thrombocytopenia (HCC) 04/06/2013    Orientation RESPIRATION BLADDER Height & Weight     Self    Incontinent Weight: 90.9 kg Height:  5\' 9"  (175.3 cm) (Per son)  BEHAVIORAL SYMPTOMS/MOOD NEUROLOGICAL BOWEL NUTRITION STATUS      Incontinent Diet (Dysphagia 2 nectar thick)  AMBULATORY STATUS COMMUNICATION OF NEEDS Skin   Limited Assist Verbally PU Stage and Appropriate Care   PU Stage 2 Dressing: Daily (trochanter, & sacrum wound-see MAR/d/c summary)                   Personal Care Assistance Level of Assistance  Bathing, Feeding, Dressing Bathing Assistance: Limited assistance Feeding assistance: Limited assistance Dressing Assistance: Limited assistance     Functional Limitations Info  Sight, Hearing, Speech Sight Info: Impaired (eyeglasses) Hearing Info: Adequate Speech Info: Adequate    SPECIAL CARE FACTORS FREQUENCY  PT (By licensed PT), OT (By licensed OT)     PT Frequency: 5x wk OT Frequency: 5x wk            Contractures Contractures Info: Not present     Additional Factors Info  Code Status, Allergies Code Status Info: DNR Allergies Info: Sulfa Antibiotics, Mysoline (Primidone), Zocor (Simvastatin)           Current Medications (08/05/2023):  This is the current hospital active medication list Current Facility-Administered Medications  Medication Dose Route Frequency Provider Last Rate Last Admin   0.9 %  sodium chloride infusion  250 mL Intravenous Continuous Luciano Cutter, MD   Stopped at 07/30/23 2325   acetaminophen (TYLENOL) tablet 650 mg  650 mg Oral Q6H PRN Maryln Gottron, MD   650 mg at 08/04/23 1959   Or   acetaminophen (TYLENOL) suppository 650 mg  650 mg Rectal Q6H PRN Kirby Crigler, Mir M, MD       albuterol (PROVENTIL) (2.5 MG/3ML) 0.083% nebulizer solution 2.5 mg  2.5 mg Nebulization Q2H PRN Kirby Crigler, Mir M, MD       apixaban Everlene Balls) tablet 2.5 mg  2.5 mg Oral BID Willeen Niece, MD   2.5 mg at 08/05/23 6045   Chlorhexidine Gluconate Cloth 2 % PADS 6 each  6 each Topical Daily Luciano Cutter, MD   6 each at 08/05/23 0955   docusate sodium (COLACE) capsule 100 mg  100 mg Oral BID PRN Luciano Cutter, MD       DULoxetine (CYMBALTA) DR capsule 60 mg  60 mg Oral Daily Kirby Crigler, Mir M, MD   60 mg at 08/05/23 [phone]  hydrALAZINE (APRESOLINE) injection 10 mg  10 mg Intravenous Q6H PRN Oretha Milch, MD   10 mg at 08/02/23 0508   [START ON 08/06/2023] hydrocortisone sodium succinate (SOLU-CORTEF) 100 MG injection 25 mg  25 mg Intravenous Daily Willeen Niece, MD       levothyroxine (SYNTHROID) tablet 25 mcg  25 mcg Oral QAC breakfast Kirby Crigler, Mir M, MD   25 mcg at 08/05/23 0524   loratadine (CLARITIN) tablet 10 mg  10 mg Oral Daily Kirby Crigler, Mir M, MD   10 mg at 08/05/23 0954   ondansetron (ZOFRAN) tablet 4 mg  4 mg Oral Q6H PRN Kirby Crigler, Mir M, MD       Or   ondansetron [facility]) injection 4 mg  4 mg Intravenous Q6H PRN Maryln Gottron, MD       Oral care mouth rinse  15 mL Mouth Rinse PRN Luciano Cutter, MD       polyethylene glycol (MIRALAX / GLYCOLAX) packet 17 g  17 g Oral Daily PRN Luciano Cutter, MD       pregabalin (LYRICA) capsule 50 mg  50 mg Oral QHS Kirby Crigler, Mir M, MD   50 mg at 08/04/23 2218   senna-docusate (Senokot-S) tablet 1 tablet  1 tablet Oral QHS PRN Maryln Gottron, MD       traMADol Janean Sark) tablet 50 mg  50 mg Oral Q12H PRN Maryln Gottron, MD         Discharge Medications: Please see discharge summary for a list of discharge medications.  Relevant Imaging Results:  Relevant Lab Results:   Additional Information SSN: [id number]  , Olegario Messier, RN

## 2023-08-05 NOTE — TOC Progression Note (Signed)
Transition of Care [facility]) - Progression Note    Patient Details  Name: Douglas Walker MRN: [id number] Date of Birth: April 14, 1925  [facility]) CM/SW Contact  , Olegario Messier, RN Phone Number: 08/05/2023, 3:29 PM  Clinical Narrative:  Faxed out await bed offers prior auth.     Expected Discharge Plan: Skilled Nursing Facility Barriers to Discharge: Continued Medical Work up  Expected Discharge Plan and Services   Discharge Planning Services: CM Consult Post Acute Care Choice: Skilled Nursing Facility Living arrangements for the past 2 months: Single Family Home                                       Social Determinants of Health (SDOH) Interventions SDOH Screenings   Food Insecurity: No Food Insecurity (08/04/2023)  Housing: Low Risk  (08/04/2023)  Transportation Needs: No Transportation Needs (08/04/2023)  Utilities: Not At Risk (08/04/2023)  Depression (PHQ2-9): Low Risk  (07/29/2023)  Tobacco Use: Medium Risk (07/30/2023)    Readmission Risk Interventions    08/04/2023    9:31 AM 10/15/2021   10:43 AM  Readmission Risk Prevention Plan  Transportation Screening Complete Complete  PCP or Specialist Appt within 3-5 Days Complete   HRI or Home Care Consult Complete   Social Work Consult for Recovery Care Planning/Counseling Complete   Palliative Care Screening Complete   Medication Review Oceanographer) Complete Complete  PCP or Specialist appointment within 3-5 days of discharge  Complete  HRI or Home Care Consult  Complete  SW Recovery Care/Counseling Consult  Complete  Palliative Care Screening  Not Applicable  Skilled Nursing Facility  Complete

## 2023-08-05 NOTE — Progress Notes (Signed)
   08/05/23 1700  SLP Visit Information  SLP Received On 08/05/23  General Information  Behavior/Cognition Alert;Cooperative;Pleasant mood  Patient Positioning Upright in bed  Oral care provided N/A  HPI Pt is a 89 yo male admitted 7/31 with sepsis from PNA +/- recurrent UTI. Pt has a h/o dysphagia after stroke with silent aspiration of thin liquids, but on his most recent MBS (March 2022) showed mild impairment with bolus cohesion and piecemeal swallowing with solids. He had occasional penetration of thin liquids before the swallow that reached the vocal folds but was not aspirated (PAS 5). Dys 3 diet and thin liquids were recommended. PMH includes: dementia, GERD, HH, stroke, CHF, PAF, recurrent UTI, indwelling foley, chronic prostitis, low grade renal cell carcinoma of the bladder.  Patient seen for follow-up for dysphagia management.  Treatment Provided  Treatment provided Dysphagia   Patient seen for dysphagia management today. He is fully alert, lying to his side due to discomfort on his bottom and right face around his eye.  RN alerted to rash.    Patient observed consuming intake including solids (entire pack of graham crackers) and thin liquids (approx 16 ounces).   Pt with timely swallow initiation with adequate mastication and not oral retention (as observed on evaluation).  His vitals appeared stable during all po intake.  No indication of aspiration.    WBC normalizing and pt with much improved mentation/swallow, thus recommend advance diet to dys3/thin.  Messaged MD regarding dietary advancement and obtained approval.      Dysphagia Treatment  Temperature Spikes Noted No  Respiratory Status Room air  Oral Cavity - Dentition Dentures, top  Treatment Methods Skilled observation;Upgraded PO texture trial  Patient observed directly with PO's Yes  Type of PO's observed Dysphagia 3 (soft);Thin liquids;Nectar-thick liquids  Feeding Fed self  Liquids provided via Straw;Cup  Oral  Phase Signs & Symptoms Other (comment) (WFL)  Type of cueing Verbal  Amount of cueing Modified independent  Pain Assessment  Pain Assessment Faces  Pain Score 2  Faces Pain Scale 2  Pain Location right face around eye and his bottom  Pain Descriptors / Indicators Burning;Tingling  Pain Intervention(s) Limited activity within patient's tolerance;Other (comment) (has rash on right high cheekbone area with evidence of him possibly scratching and ? Some blistering - pt does not recall if he had chicken pox as a child), alerted RN  SLP - End of Session  Patient left in bed;with call bell/phone within reach;with bed alarm set  Nurse Communication Swallow strategies reviewed;Diet recommendation;Aspiration precautions reviewed  Assessment / Recommendations / Plan  Plan Continue with current plan of care  Dysphagia Recommendations  Diet recommendations Dysphagia 3 (mechanical soft);Thin liquid  Liquids provided via Cup;Straw  Medication Administration Whole meds with puree  Supervision Intermittent supervision to cue for compensatory strategies  Compensations Minimize environmental distractions;Small sips/bites;Slow rate  Postural Changes and/or Swallow Maneuvers Seated upright 90 degrees;Upright 30-60 min after meal  Progression Toward Goals  Progression toward goals Progressing toward goals  Patient/Family Stated Goal "I don't like that drink" - pt has not been receiving water between his meals as ordered  SLP Time Calculation  SLP Start Time (ACUTE ONLY) 1607  SLP Stop Time (ACUTE ONLY) 1619  SLP Time Calculation (min) (ACUTE ONLY) 12 min  SLP Evaluations  $ SLP Speech Visit 1 Visit  SLP Evaluations  $Swallowing Treatment 1 Procedure   Rolena Infante, MS [facility] SLP Acute Rehab Services Office [phone]

## 2023-08-05 NOTE — Progress Notes (Signed)
Mobility Specialist - Progress Note   08/05/23 1143  Mobility  Activity Ambulated with assistance in hallway  Level of Assistance Standby assist, set-up cues, supervision of patient - no hands on  Assistive Device Front wheel walker  Distance Ambulated (ft) 250 ft  Range of Motion/Exercises Active  Activity Response Tolerated well  Mobility Referral Yes  $Mobility charge 1 Mobility  Mobility Specialist Start Time (ACUTE ONLY) 1130  Mobility Specialist Stop Time (ACUTE ONLY) 1143  Mobility Specialist Time Calculation (min) (ACUTE ONLY) 13 min   Pt was found in bed and agreeable to ambulate. Grew fatigued with session. At EOS returned to bed with all needs met. Call bell in reach and family in room.   Billey Chang Mobility Specialist

## 2023-08-05 NOTE — Progress Notes (Signed)
Assumed care of patient at 1500 from Phillis Knack, RN. Agree with previously documented assessment and will continue current plan of care.

## 2023-08-05 NOTE — Progress Notes (Signed)
Remote pacemaker transmission.   

## 2023-08-06 DIAGNOSIS — A419 Sepsis, unspecified organism: Secondary | ICD-10-CM | POA: Diagnosis not present

## 2023-08-06 NOTE — Progress Notes (Signed)
Physical Therapy Treatment Patient Details Name: Douglas Walker MRN: [id number] DOB: 1925-01-31 Today's Date: 08/06/2023   History of Present Illness 89 years old male presented in the ED with fever, confusion, generalized weakness and shaking and admitted 07/30/23 for Septic shock secondary to community-acquired pneumonia /recurrent UTI.  PMH significant for but not limited to: dementia, chronic diastolic heart failure, s/p pacemaker, essential tremor primarily involving the lips/oral musculature, hyperlipidemia, paroxysmal A-fib on Eliquis, pulmonary hypertension, chronic foley catheter, CVA, neuropathy    PT Comments  Pt received in the restroom, reports ambulated with nursing earlier today, agreeable to therapy. Pt tolerates seated BLE strengthening exercises well, no complaints. Pt ambulates with RW, good steadiness without overt LOB or assistance needed with completing 180 degree turns and navigating obstacles in room and hallway. Pt fatigues during session, requests return to bed at EOS.    If plan is discharge home, recommend the following: A little help with walking and/or transfers;A little help with bathing/dressing/bathroom;Help with stairs or ramp for entrance;Assistance with cooking/housework   Can travel by private vehicle     Yes  Equipment Recommendations  None recommended by PT    Recommendations for Other Services       Precautions / Restrictions Precautions Precautions: Fall Precaution Comments: blind right eye, HOH even with hearing aides Restrictions Weight Bearing Restrictions: No     Mobility  Bed Mobility Overal bed mobility: Needs Assistance Bed Mobility: Sit to Supine       Sit to supine: Supervision   General bed mobility comments: increased time and effort, able to lift BLE Back into bed without assist or cues, slowly repositions to comfort position    Transfers Overall transfer level: Needs assistance Equipment used: Rolling walker (2  wheels) Transfers: Sit to/from Stand Sit to Stand: Contact guard assist           General transfer comment: CGA for STS transfers from EOB, toilet and recliner, verbal cues for hand placement, good steadiness    Ambulation/Gait Ambulation/Gait assistance: Contact guard assist Gait Distance (Feet): 200 Feet Assistive device: Rolling walker (2 wheels) Gait Pattern/deviations: Step-through pattern, Decreased stride length Gait velocity: decreased     General Gait Details: step through gait pattern with trunk slightly flexed, good steadiness without overt LOB, able to clear past osbtacles and make 180 degree turns with slightly increased time   Stairs             Wheelchair Mobility     Tilt Bed    Modified Rankin (Stroke Patients Only)       Balance Overall balance assessment: Needs assistance, History of Falls         Standing balance support: Bilateral upper extremity supported, Reliant on assistive device for balance, During functional activity Standing balance-Leahy Scale: Poor                              Cognition Arousal: Alert Behavior During Therapy: WFL for tasks assessed/performed Overall Cognitive Status: History of cognitive impairments - at baseline                                 General Comments: pt pleasant, cooperative, follows commands, HOH needing repeat commands/cues at times        Exercises General Exercises - Lower Extremity Long Arc Quad: Seated, AROM, Strengthening, Both, 10 reps Hip ABduction/ADduction: Seated, AROM, Strengthening, Both, 10  reps Hip Flexion/Marching: Seated, AROM, Strengthening, Both, 10 reps Other Exercises Other Exercises: STS reps from recliner 5 reps, 2 sets    General Comments        Pertinent Vitals/Pain Pain Assessment Pain Assessment: Faces Faces Pain Scale: Hurts a little bit Pain Location: "nothing that isn't normal" Pain Intervention(s): Monitored during session     Home Living                          Prior Function            PT Goals (current goals can now be found in the care plan section) Acute Rehab PT Goals PT Goal Formulation: With patient Time For Goal Achievement: 08/17/23 Potential to Achieve Goals: Good Progress towards PT goals: Progressing toward goals    Frequency    Min 1X/week      PT Plan Current plan remains appropriate    Co-evaluation              AM-PAC PT "6 Clicks" Mobility   Outcome Measure  Help needed turning from your back to your side while in a flat bed without using bedrails?: A Little Help needed moving from lying on your back to sitting on the side of a flat bed without using bedrails?: A Little Help needed moving to and from a bed to a chair (including a wheelchair)?: A Little Help needed standing up from a chair using your arms (e.g., wheelchair or bedside chair)?: A Little Help needed to walk in hospital room?: A Little Help needed climbing 3-5 steps with a railing? : A Little 6 Click Score: 18    End of Session Equipment Utilized During Treatment: Gait belt Activity Tolerance: Patient tolerated treatment well Patient left: in bed;with call bell/phone within reach;with bed alarm set Nurse Communication: Mobility status PT Visit Diagnosis: Difficulty in walking, not elsewhere classified (R26.2);Muscle weakness (generalized) (M62.81)     Time: 7829-5621 PT Time Calculation (min) (ACUTE ONLY): 26 min  Charges:    $Gait Training: 8-22 mins $Therapeutic Exercise: 8-22 mins PT General Charges $$ ACUTE PT VISIT: 1 Visit                     Tori  PT, DPT 08/06/23, 12:11 PM

## 2023-08-06 NOTE — TOC Progression Note (Addendum)
Transition of Care [facility]) - Progression Note    Patient Details  Name: Douglas Walker MRN: [id number] Date of Birth: 1925/09/25  Transition of Care [facility]) CM/SW Contact  , Olegario Messier, RN Phone Number: 08/06/2023, 3:08 PM  Clinical Narrative:  Bed offers given await choice.  -3:20p-[name](son) chose [facility]-initiated Berkley Harvey pending Berkley Harvey [id number] auth.   Expected Discharge Plan: Skilled Nursing Facility Barriers to Discharge: Continued Medical Work up  Expected Discharge Plan and Services   Discharge Planning Services: CM Consult Post Acute Care Choice: Skilled Nursing Facility Living arrangements for the past 2 months: Single Family Home                                       Social Determinants of Health (SDOH) Interventions SDOH Screenings   Food Insecurity: No Food Insecurity (08/04/2023)  Housing: Low Risk  (08/04/2023)  Transportation Needs: No Transportation Needs (08/04/2023)  Utilities: Not At Risk (08/04/2023)  Depression (PHQ2-9): Low Risk  (07/29/2023)  Tobacco Use: Medium Risk (07/30/2023)    Readmission Risk Interventions    08/04/2023    9:31 AM 10/15/2021   10:43 AM  Readmission Risk Prevention Plan  Transportation Screening Complete Complete  PCP or Specialist Appt within 3-5 Days Complete   HRI or Home Care Consult Complete   Social Work Consult for Recovery Care Planning/Counseling Complete   Palliative Care Screening Complete   Medication Review Oceanographer) Complete Complete  PCP or Specialist appointment within 3-5 days of discharge  Complete  HRI or Home Care Consult  Complete  SW Recovery Care/Counseling Consult  Complete  Palliative Care Screening  Not Applicable  Skilled Nursing Facility  Complete

## 2023-08-06 NOTE — Progress Notes (Signed)
Speech Language Pathology Treatment: Dysphagia  Patient Details Name: Douglas Walker MRN: [id number] DOB: [birth year] Today's Date: 08/06/2023 Time: 1720-1730 SLP Time Calculation (min) (ACUTE ONLY): 10 min  Assessment / Plan / Recommendation Clinical Impression  Follow up today for dysphagia management given pt's dietary advancement yesterday to dys3/thin. Today pt greeted, in bed, requesting to be repositioned, SLP and NT repositioned with pt assistance. Per NT, pt with much improved intake today with his diet advancement and no indication of difficulties. Observed pt only consuming water today via straw as he politely declined to consume more.   Recommend continue diet with precautions. Pt recalled walking with Nurse Tech today and her providing him a bath - he relayed significant gratitude to her. At this time, no SLP follow up indicated as pt is managing diet advancement well at this time. Informed pt of need to remove and/or brush his dentures nightly - but he politely declined.   Pt likely at his baseline regarding his swallow function and advancement to regular/thin upon dc may be beneficial.      HPI HPI: Pt is a 89 yo male admitted 7/31 with sepsis from PNA +/- recurrent UTI. Pt has a h/o dysphagia after stroke with silent aspiration of thin liquids, but on his most recent MBS (March 2022) showed mild impairment with bolus cohesion and piecemeal swallowing with solids. He had occasional penetration of thin liquids before the swallow that reached the vocal folds but was not aspirated (PAS 5). Dys 3 diet and thin liquids were recommended. PMH includes: dementia, GERD, HH, stroke, CHF, PAF, recurrent UTI, indwelling foley, chronic prostitis, low grade renal cell carcinoma of the bladder.  Patient seen for follow-up for dysphagia management.  Diet was advanced to dys3/thin as pt with improved medical and cognitive status.   NT reports his intake was good today without evidence of difficulty  swallowing.      SLP Plan  All goals met      Recommendations for follow up therapy are one component of a multi-disciplinary discharge planning process, led by the attending physician.  Recommendations may be updated based on patient status, additional functional criteria and insurance authorization.    Recommendations  Diet recommendations: Dysphagia 3 (mechanical soft);Thin liquid Liquids provided via: Cup;Straw Medication Administration: Whole meds with puree Supervision: Intermittent supervision to cue for compensatory strategies Compensations: Minimize environmental distractions;Small sips/bites;Slow rate Postural Changes and/or Swallow Maneuvers: Seated upright 90 degrees;Upright 30-60 min after meal                  Oral care BID     Dysphagia, unspecified (R13.10)     All goals met    Rolena Infante, MS [facility] SLP Acute Rehab Services Office [phone]  Chales Abrahams  08/06/2023, 5:52 PM

## 2023-08-06 NOTE — Progress Notes (Signed)
  Progress Note   Patient: Douglas Walker WUX:[id number] DOB: 02/24/1925 DOA: 07/30/2023     7 DOS: the patient was seen and examined on 08/06/2023   Brief hospital course: 89 years old male with PMH significant for dementia, chronic diastolic heart failure, s/p pacemaker, essential tremor, hyperlipidemia, paroxysmal A-fib on Eliquis, pulmonary hypertension presented in the ED with fever, confusion, generalized weakness and shaking.  Patient also found to have productive cough and hypoxia requiring supplemental oxygen.  Patient was also hypotensive requiring Levophed support.  Patient was admitted in the ICU for sepsis secondary to community-acquired pneumonia  and recurrent UTI.  He was started on empiric antibiotics. Now off Levophed. TRH pickup 08/01/2023  Assessment and Plan: Septic shock sec. to community-acquired pneumonia /recurrent UTI: Patient does have history of multidrug-resistant UTI on chronic Macrobid therapy. Patient was hypotensive requiring Levophed support to keep MAP goal above 65. Lactic acid resolved with IV hydration.   Patient was initiated on meropenem and azithromycin as per ID. Blood pressure has improved and remains off pressors Weaning stress steroids, down to 25mg  solucortef IV Midodrine was discontinued Pt completed 7 days of uansyn per ID recs as of 8/6 Legionella antigen, Strep Pneumo negative.   Acute on chronic kidney disease stage IIIa: Baseline Serum creatinine 1.2- 1.3  slightly increased 2.08 Continue IV fluid resuscitation.  Avoid nephrotoxic medications. Serum creatinine had been improving   Acute encephalopathy secondary to sepsis: Mental status has improved. Patient does have history of dementia. As per son patient at baseline can speak but less talkative from prior stroke.   Obtain CT head to r/o acute change If mental status changes. Speech and swallow eval recommended nectar thick liquids.  Reorient as able to.   Atrial fibrillation: Heart  rate is well-controlled.   Continue Eliquis.   Hypothyroidism: continue levothyroxine.   Chronic pain: Continue Cymbalta and Lyrica.   Dementia: No behavioral problem.  Continue to monitor.      Subjective: Without complaints this AM  Physical Exam: Vitals:   08/05/23 2103 08/06/23 0453 08/06/23 0500 08/06/23 1154  BP: (!) 144/72 (!) 159/81  128/65  Pulse: 69 68  70  Resp: (!) 21 20  18   Temp: 98.3 F (36.8 C) 98.3 F (36.8 C)  98.4 F (36.9 C)  TempSrc: Oral Oral  Oral  SpO2: 94% 95%  95%  Weight:   88.8 kg   Height:       General exam: Awake, laying in bed, in nad Respiratory system: Normal respiratory effort, no wheezing Cardiovascular system: regular rate, s1, s2 Gastrointestinal system: Soft, nondistended, positive BS Central nervous system: CN2-12 grossly intact, strength intact Extremities: Perfused, no clubbing Skin: Normal skin turgor, no notable skin lesions seen Psychiatry: Mood normal // no visual hallucinations   Data Reviewed:  Labs reviewed: Na 138, K 3.7, Cr 1.26, WBC 10.5  Family Communication: Pt in room, family not at bedside  Disposition: Status is: Inpatient Remains inpatient appropriate because: severity of illness  Planned Discharge Destination: Skilled nursing facility    Author: Rickey Barbara, MD 08/06/2023 3:32 PM  For on call review [email or web address].

## 2023-08-06 NOTE — Hospital Course (Signed)
89 years old male with PMH significant for dementia, chronic diastolic heart failure, s/p pacemaker, essential tremor, hyperlipidemia, paroxysmal A-fib on Eliquis, pulmonary hypertension presented in the ED with fever, confusion, generalized weakness and shaking.  Patient also found to have productive cough and hypoxia requiring supplemental oxygen.  Patient was also hypotensive requiring Levophed support.  Patient was admitted in the ICU for sepsis secondary to community-acquired pneumonia  and recurrent UTI.  He was started on empiric antibiotics. Now off Levophed. TRH pickup 08/01/2023

## 2023-08-06 NOTE — Plan of Care (Signed)
  Problem: Education: Goal: Knowledge of General Education information will improve Description: Including pain rating scale, medication(s)/side effects and non-pharmacologic comfort measures Outcome: Adequate for Discharge   Problem: Health Behavior/Discharge Planning: Goal: Ability to manage health-related needs will improve Outcome: Progressing

## 2023-08-07 DIAGNOSIS — A419 Sepsis, unspecified organism: Secondary | ICD-10-CM | POA: Diagnosis not present

## 2023-08-07 LAB — CBC
HCT: 33.1 % — ABNORMAL LOW (ref 39.0–52.0)
Hemoglobin: 10.6 g/dL — ABNORMAL LOW (ref 13.0–17.0)
MCH: 30.2 pg (ref 26.0–34.0)
MCHC: 32 g/dL (ref 30.0–36.0)
MCV: 94.3 fL (ref 80.0–100.0)
Platelets: 143 10*3/uL — ABNORMAL LOW (ref 150–400)
RBC: 3.51 MIL/uL — ABNORMAL LOW (ref 4.22–5.81)
RDW: 16.3 % — ABNORMAL HIGH (ref 11.5–15.5)
WBC: 7.8 10*3/uL (ref 4.0–10.5)
nRBC: 0 % (ref 0.0–0.2)

## 2023-08-07 LAB — COMPREHENSIVE METABOLIC PANEL
ALT: 26 U/L (ref 0–44)
AST: 22 U/L (ref 15–41)
Albumin: 2.7 g/dL — ABNORMAL LOW (ref 3.5–5.0)
Alkaline Phosphatase: 51 U/L (ref 38–126)
Anion gap: 7 (ref 5–15)
BUN: 23 mg/dL (ref 8–23)
CO2: 25 mmol/L (ref 22–32)
Calcium: 8.5 mg/dL — ABNORMAL LOW (ref 8.9–10.3)
Chloride: 107 mmol/L (ref 98–111)
Creatinine, Ser: 1.34 mg/dL — ABNORMAL HIGH (ref 0.61–1.24)
GFR, Estimated: 49 mL/min — ABNORMAL LOW (ref >60)
Glucose, Bld: 128 mg/dL — ABNORMAL HIGH (ref 70–99)
Potassium: 3.9 mmol/L (ref 3.5–5.1)
Sodium: 139 mmol/L (ref 135–145)
Total Bilirubin: 0.7 mg/dL (ref 0.3–1.2)
Total Protein: 6.4 g/dL — ABNORMAL LOW (ref 6.5–8.1)

## 2023-08-07 NOTE — Progress Notes (Signed)
Report received from [name] RN. No changes noted from her assessment. Continue with plan of care

## 2023-08-07 NOTE — Plan of Care (Signed)
  Problem: Health Behavior/Discharge Planning: Goal: Ability to manage health-related needs will improve Outcome: Progressing   Problem: Clinical Measurements: Goal: Ability to maintain clinical measurements within normal limits will improve Outcome: Progressing   Problem: Clinical Measurements: Goal: Will remain free from infection Outcome: Progressing   Problem: Clinical Measurements: Goal: Diagnostic test results will improve Outcome: Progressing   Problem: Clinical Measurements: Goal: Respiratory complications will improve Outcome: Progressing   Problem: Activity: Goal: Risk for activity intolerance will decrease Outcome: Progressing   Problem: Nutrition: Goal: Adequate nutrition will be maintained Outcome: Progressing   Problem: Elimination: Goal: Will not experience complications related to bowel motility Outcome: Progressing

## 2023-08-07 NOTE — Progress Notes (Signed)
  Progress Note   Patient: Douglas Walker ION:[id number] DOB: 11-17-1925 DOA: 07/30/2023     8 DOS: the patient was seen and examined on 08/07/2023   Brief hospital course: 89 years old male with PMH significant for dementia, chronic diastolic heart failure, s/p pacemaker, essential tremor, hyperlipidemia, paroxysmal A-fib on Eliquis, pulmonary hypertension presented in the ED with fever, confusion, generalized weakness and shaking.  Patient also found to have productive cough and hypoxia requiring supplemental oxygen.  Patient was also hypotensive requiring Levophed support.  Patient was admitted in the ICU for sepsis secondary to community-acquired pneumonia  and recurrent UTI.  He was started on empiric antibiotics. Now off Levophed. TRH pickup 08/01/2023  Assessment and Plan: Septic shock sec. to community-acquired pneumonia /recurrent UTI: Patient does have history of multidrug-resistant UTI on chronic Macrobid therapy. Patient was hypotensive requiring Levophed support to keep MAP goal above 65. Lactic acid resolved with IV hydration.   Patient was initiated on meropenem and azithromycin as per ID. Blood pressure has improved and remains off pressors Weaning stress steroids, down to 25mg  solucortef IV Midodrine was discontinued Pt completed 7 days of uansyn per ID recs as of 8/6 Legionella antigen, Strep Pneumo negative. -Awaiting snf placement   Acute on chronic kidney disease stage IIIa: Baseline Serum creatinine 1.2- 1.3  slightly increased 2.08 Continue IV fluid resuscitation.  Avoid nephrotoxic medications. Serum creatinine improving   Acute encephalopathy secondary to sepsis: Mental status has improved. Patient does have history of dementia. As per son patient at baseline can speak but less talkative from prior stroke.   Obtain CT head to r/o acute change If mental status changes. Speech and swallow eval recommended nectar thick liquids.   -conversing appropriately this AM    Atrial fibrillation: Heart rate is well-controlled.   Continue Eliquis.   Hypothyroidism: continue levothyroxine.   Chronic pain: Continue Cymbalta and Lyrica.   Dementia: No behavioral problem.  Continue to monitor.      Subjective: Feeling cold and asking to get back to the bed this AM  Physical Exam: Vitals:   08/06/23 2011 08/06/23 2141 08/07/23 0500 08/07/23 1323  BP: (!) 140/75  (!) 169/82 133/62  Pulse: 70  64 69  Resp: (!) 22 18 20 19   Temp: 98.3 F (36.8 C)  97.7 F (36.5 C) 97.6 F (36.4 C)  TempSrc: Oral  Oral Axillary  SpO2: 95%  96% 98%  Weight:   90 kg   Height:       General exam: Conversant, in no acute distress Respiratory system: normal chest rise, clear, no audible wheezing Cardiovascular system: regular rhythm, s1-s2 Gastrointestinal system: Nondistended, nontender, pos BS Central nervous system: No seizures, no tremors Extremities: No cyanosis, no joint deformities Skin: No rashes, no pallor Psychiatry: Affect normal // no auditory hallucinations   Data Reviewed:  Labs reviewed: Na 139, K 3.9, Cr 1.34, WBC 7.8, Hgb 10.6, Plts 143  Family Communication: Pt in room, family not at bedside  Disposition: Status is: Inpatient Remains inpatient appropriate because: severity of illness  Planned Discharge Destination: Skilled nursing facility    Author: Rickey Barbara, MD 08/07/2023 5:40 PM  For on call review [email or web address].

## 2023-08-07 NOTE — Progress Notes (Signed)
Occupational Therapy Treatment Patient Details Name: Douglas Walker MRN: [id number] DOB: 18-Sep-1925 Today's Date: 08/07/2023   History of present illness 89 years old male presented in the ED with fever, confusion, generalized weakness and shaking and admitted 07/30/23 for Septic shock secondary to community-acquired pneumonia /recurrent UTI.  PMH significant for but not limited to: dementia, chronic diastolic heart failure, s/p pacemaker, essential tremor primarily involving the lips/oral musculature, hyperlipidemia, paroxysmal A-fib on Eliquis, pulmonary hypertension, chronic foley catheter, CVA, neuropathy   OT comments  Pt in bed upon therapy arrival. Agreeable to participate in OT treatment session. Session focused on bed mobility, standing balance, and safety awareness. Due pt's HOH, carry over and confirming understanding of education provided during session were difficult. Pt did not demonstrate any LOB although did not demonstrate safe RW management and will benefit from continued reinforcement.       If plan is discharge home, recommend the following:  A little help with bathing/dressing/bathroom;Assistance with cooking/housework;Assist for transportation;Direct supervision/assist for financial management;Direct supervision/assist for medications management;A little help with walking and/or transfers   Equipment Recommendations  None recommended by OT       Precautions / Restrictions Precautions Precautions: Fall Precaution Comments: blind right eye, HOH even with hearing aides Restrictions Weight Bearing Restrictions: No       Mobility Bed Mobility Overal bed mobility: Needs Assistance Bed Mobility: Supine to Sit     Supine to sit: Supervision, HOB elevated     General bed mobility comments: increased time and effort to complete. Required VC to initiate task. No physical assist needed to complete.    Transfers Overall transfer level: Needs assistance Equipment  used: Rolling walker (2 wheels) Transfers: Sit to/from Stand, Bed to chair/wheelchair/BSC Sit to Stand: Contact guard assist, From elevated surface     Step pivot transfers: Supervision     General transfer comment: CGA provided initially for safety during sit to stand. When approaching the sink to rinse off dentures, pt demonstrated decreased judgement and safety awareness during RW management. OT attempted to provided VC on proper approach and placement although pt did not hear or did not attempt to carry over.     Balance Overall balance assessment: History of Falls Sitting-balance support: Feet supported Sitting balance-Leahy Scale: Fair     Standing balance support: Bilateral upper extremity supported, Reliant on assistive device for balance, During functional activity Standing balance-Leahy Scale: Fair                             ADL either performed or assessed with clinical judgement   ADL Overall ADL's : Needs assistance/impaired     Grooming: Oral care;Supervision/safety;Standing Grooming Details (indicate cue type and reason): Stood at sink to rinse off dentures. VC provided to use toothbrush and toothpaste to scrub tongue and gums. Pt chuckled and stated, "i'm good."                            Cognition Arousal: Alert Behavior During Therapy: Flat affect Overall Cognitive Status: History of cognitive impairments - at baseline          General Comments: Pt is pleasant although has difficulty following commands and education d/t HOH.                   Pertinent Vitals/ Pain       Pain Assessment Pain Assessment: Faces Faces Pain Scale: Hurts a  little bit Pain Location: pt states he has a headache. And his buttocks hurts. Pain Descriptors / Indicators: Grimacing, Discomfort, Aching Pain Intervention(s): Repositioned, Patient requesting pain meds-RN notified         Frequency  Min 1X/week        Progress Toward Goals  OT  Goals(current goals can now be found in the care plan section)  Progress towards OT goals: Progressing toward goals  ADL Goals Pt Will Perform Grooming:  (goal met 8/8) Pt Will Transfer to Toilet:  (goal met 8/8)         AM-PAC OT "6 Clicks" Daily Activity     Outcome Measure   Help from another person eating meals?: None Help from another person taking care of personal grooming?: A Little Help from another person toileting, which includes using toliet, bedpan, or urinal?: A Little Help from another person bathing (including washing, rinsing, drying)?: A Little Help from another person to put on and taking off regular upper body clothing?: None Help from another person to put on and taking off regular lower body clothing?: A Little 6 Click Score: 20    End of Session Equipment Utilized During Treatment: Rolling walker (2 wheels);Gait belt  OT Visit Diagnosis: Unsteadiness on feet (R26.81);Muscle weakness (generalized) (M62.81);History of falling (Z91.81)   Activity Tolerance Patient tolerated treatment well   Patient Left in chair;with call bell/phone within reach;with chair alarm set;with nursing/sitter in room   Nurse Communication Patient requests pain meds        Time: 6387-5643 OT Time Calculation (min): 21 min  Charges: OT General Charges $OT Visit: 1 Visit OT Treatments $Therapeutic Activity: 8-22 mins  Douglas Walker, OTR/L,CBIS  Supplemental OT - [facility] and [facility] Secure Chat Preferred    , Charisse March 08/07/2023, 6:44 PM

## 2023-08-07 NOTE — TOC Progression Note (Signed)
Transition of Care [facility]) - Progression Note    Patient Details  Name: Douglas Walker MRN: [id number] Date of Birth: 1925-05-07  Transition of [facility]) CM/SW Contact  , Olegario Messier, RN Phone Number: 08/07/2023, 12:22 PM  Clinical Narrative:  initiated Berkley Harvey for [name] pending Berkley Harvey WU#[id number] Berkley Harvey.MD updated.     Expected Discharge Plan: Skilled Nursing Facility Barriers to Discharge: Insurance Authorization  Expected Discharge Plan and Services   Discharge Planning Services: CM Consult Post Acute Care Choice: Skilled Nursing Facility Living arrangements for the past 2 months: Single Family Home                                       Social Determinants of Health (SDOH) Interventions SDOH Screenings   Food Insecurity: No Food Insecurity (08/04/2023)  Housing: Low Risk  (08/04/2023)  Transportation Needs: No Transportation Needs (08/04/2023)  Utilities: Not At Risk (08/04/2023)  Depression (PHQ2-9): Low Risk  (07/29/2023)  Tobacco Use: Medium Risk (07/30/2023)    Readmission Risk Interventions    08/04/2023    9:31 AM 10/15/2021   10:43 AM  Readmission Risk Prevention Plan  Transportation Screening Complete Complete  PCP or Specialist Appt within 3-5 Days Complete   HRI or Home Care Consult Complete   Social Work Consult for Recovery Care Planning/Counseling Complete   Palliative Care Screening Complete   Medication Review Oceanographer) Complete Complete  PCP or Specialist appointment within 3-5 days of discharge  Complete  HRI or Home Care Consult  Complete  SW Recovery Care/Counseling Consult  Complete  Palliative Care Screening  Not Applicable  Skilled Nursing Facility  Complete

## 2023-08-08 DIAGNOSIS — A419 Sepsis, unspecified organism: Secondary | ICD-10-CM | POA: Diagnosis not present

## 2023-08-08 NOTE — Progress Notes (Signed)
Physical Therapy Treatment Patient Details Name: Douglas Walker MRN: [id number] DOB: April 21, 1925 Today's Date: 08/08/2023   History of Present Illness 89 years old male presented in the ED with fever, confusion, generalized weakness and shaking and admitted 07/30/23 for Septic shock secondary to community-acquired pneumonia /recurrent UTI.  PMH significant for but not limited to: dementia, chronic diastolic heart failure, s/p pacemaker, essential tremor primarily involving the lips/oral musculature, hyperlipidemia, paroxysmal A-fib on Eliquis, pulmonary hypertension, chronic foley catheter, CVA, neuropathy    PT Comments  Pt pleasant, needing CGA to supv with ambulation. Pt HOH, needing repeat cues for RW management with turns and around obstacles. Pt with increased speed at times then returns to baseline cadence, no overt LOB. Session ended early due to pt needing restroom; NT aware.    If plan is discharge home, recommend the following: A little help with walking and/or transfers;A little help with bathing/dressing/bathroom;Help with stairs or ramp for entrance;Assistance with cooking/housework   Can travel by private vehicle     Yes  Equipment Recommendations  None recommended by PT    Recommendations for Other Services       Precautions / Restrictions Precautions Precautions: Fall Precaution Comments: blind right eye, HOH even with hearing aides Restrictions Weight Bearing Restrictions: No     Mobility  Bed Mobility Overal bed mobility: Needs Assistance Bed Mobility: Supine to Sit     Supine to sit: Supervision, HOB elevated     General bed mobility comments: pt comes to sitting EOB with increased time and effort, no physical assist, therapist managing foley catheter    Transfers Overall transfer level: Needs assistance Equipment used: Rolling walker (2 wheels) Transfers: Sit to/from Stand Sit to Stand: Supervision           General transfer comment: verbal  cues for hand placement, good steadiness with powering up to stand    Ambulation/Gait Ambulation/Gait assistance: Contact guard assist, Supervision Gait Distance (Feet): 260 Feet Assistive device: Rolling walker (2 wheels) Gait Pattern/deviations: Step-through pattern, Decreased stride length, Trunk flexed Gait velocity: decreased     General Gait Details: step through gait pattern with trunk slightly flexed, able to clear past obstacles, needing increased time and steps wtih turns, intermittent quick steps then returns to more consistent step progression, CGA to supv for safety without overt LOB   Stairs             Wheelchair Mobility     Tilt Bed    Modified Rankin (Stroke Patients Only)       Balance Overall balance assessment: Needs assistance, History of Falls Sitting-balance support: Feet supported Sitting balance-Leahy Scale: Good Sitting balance - Comments: seated EOB   Standing balance support: Bilateral upper extremity supported, Reliant on assistive device for balance, During functional activity Standing balance-Leahy Scale: Poor                              Cognition Arousal: Alert Behavior During Therapy: WFL for tasks assessed/performed Overall Cognitive Status: History of cognitive impairments - at baseline                                 General Comments: pt pleasant, cooperative, follows commands, HOH needing repeat commands/cues at times        Exercises      General Comments        Pertinent Vitals/Pain Pain Assessment  Pain Assessment: No/denies pain    Home Living                          Prior Function            PT Goals (current goals can now be found in the care plan section) Acute Rehab PT Goals PT Goal Formulation: With patient Time For Goal Achievement: 08/17/23 Potential to Achieve Goals: Good Progress towards PT goals: Progressing toward goals    Frequency    Min  1X/week      PT Plan Current plan remains appropriate    Co-evaluation              AM-PAC PT "6 Clicks" Mobility   Outcome Measure  Help needed turning from your back to your side while in a flat bed without using bedrails?: A Little Help needed moving from lying on your back to sitting on the side of a flat bed without using bedrails?: A Little Help needed moving to and from a bed to a chair (including a wheelchair)?: A Little Help needed standing up from a chair using your arms (e.g., wheelchair or bedside chair)?: A Little Help needed to walk in hospital room?: A Little Help needed climbing 3-5 steps with a railing? : A Little 6 Click Score: 18    End of Session Equipment Utilized During Treatment: Gait belt Activity Tolerance: Patient tolerated treatment well Patient left: with call bell/phone within reach (in restroom on toilet with pull string in hand, NT aware) Nurse Communication: Mobility status (in restroom with pull string in hand) PT Visit Diagnosis: Difficulty in walking, not elsewhere classified (R26.2);Muscle weakness (generalized) (M62.81)     Time: 4782-9562 PT Time Calculation (min) (ACUTE ONLY): 15 min  Charges:    $Gait Training: 8-22 mins PT General Charges $$ ACUTE PT VISIT: 1 Visit                     Tori  PT, DPT 08/08/23, 11:23 AM

## 2023-08-08 NOTE — Progress Notes (Signed)
  Progress Note   Patient: Douglas Walker QIH:[id number] DOB: March 07, 1925 DOA: 07/30/2023     9 DOS: the patient was seen and examined on 08/08/2023   Brief hospital course: 89 years old male with PMH significant for dementia, chronic diastolic heart failure, s/p pacemaker, essential tremor, hyperlipidemia, paroxysmal A-fib on Eliquis, pulmonary hypertension presented in the ED with fever, confusion, generalized weakness and shaking.  Patient also found to have productive cough and hypoxia requiring supplemental oxygen.  Patient was also hypotensive requiring Levophed support.  Patient was admitted in the ICU for sepsis secondary to community-acquired pneumonia  and recurrent UTI.  He was started on empiric antibiotics. Now off Levophed. TRH pickup 08/01/2023  Assessment and Plan: Septic shock sec. to community-acquired pneumonia /recurrent UTI: Patient does have history of multidrug-resistant UTI on chronic Macrobid therapy. Patient was hypotensive requiring Levophed support to keep MAP goal above 65. Lactic acid resolved with IV hydration.   Patient was initiated on meropenem and azithromycin as per ID. Blood pressure has improved and remains off pressors Weaning stress steroids, down to 25mg  solucortef IV Midodrine was discontinued Pt completed 7 days of uansyn per ID recs as of 8/6 Legionella antigen, Strep Pneumo negative. -SNF placement remains pending   Acute on chronic kidney disease stage IIIa: Baseline Serum creatinine 1.2- 1.3  slightly increased 2.08 Continue IV fluid resuscitation.  Avoid nephrotoxic medications. Serum creatinine improving   Acute encephalopathy secondary to sepsis: Mental status has improved. Patient does have history of dementia. As per son patient at baseline can speak but less talkative from prior stroke.   Obtain CT head to r/o acute change If mental status changes. Speech and swallow eval recommended nectar thick liquids.   -conversing appropriately  this AM   Atrial fibrillation: Heart rate is well-controlled.   Continue Eliquis.   Hypothyroidism: continue levothyroxine.   Chronic pain: Continue Cymbalta and Lyrica.   Dementia: No behavioral problem.  Continue to monitor.      Subjective: Without complaints this AM  Physical Exam: Vitals:   08/07/23 2028 08/08/23 0500 08/08/23 0518 08/08/23 1325  BP: 131/74  (!) 143/74 132/64  Pulse: 70  67 70  Resp: 20     Temp: 98.1 F (36.7 C)  98.1 F (36.7 C) 98.2 F (36.8 C)  TempSrc: Oral  Oral Oral  SpO2: 95%  94% 97%  Weight:  90.1 kg    Height:       General exam: Awake, laying in bed, in nad Respiratory system: Normal respiratory effort, no wheezing Cardiovascular system: regular rate, s1, s2 Gastrointestinal system: Soft, nondistended, positive BS Central nervous system: CN2-12 grossly intact, strength intact Extremities: Perfused, no clubbing Skin: Normal skin turgor, no notable skin lesions seen Psychiatry: Mood normal // no visual hallucinations   Data Reviewed:  There are no new results to review at this time.  Family Communication: Pt in room, family not at bedside  Disposition: Status is: Inpatient Remains inpatient appropriate because: severity of illness  Planned Discharge Destination: Skilled nursing facility    Author: Rickey Barbara, MD 08/08/2023 4:24 PM  For on call review [email or web address].

## 2023-08-08 NOTE — TOC Progression Note (Signed)
Transition of Care [facility]) - Progression Note    Patient Details  Name: Douglas Walker MRN: [id number] Date of Birth: 01-Mar-1925  Transition of Care [facility]) CM/SW Contact  Howell Rucks, RN Phone Number: 08/08/2023, 5:33 PM  Clinical Narrative:   Per Camelia Eng assistant, phone call received from [facility], informing of denial of short term rehab-SNF request. NCM outreached to pt's son Douglas Walker), informed of denial and right to appeal, Douglas Walker states they will like to appeal the denial. Will present official denial letter to family when available to initiate denial. TOC will continue to follow.    Expected Discharge Plan: Skilled Nursing Facility Barriers to Discharge: Insurance Authorization  Expected Discharge Plan and Services   Discharge Planning Services: CM Consult Post Acute Care Choice: Skilled Nursing Facility Living arrangements for the past 2 months: Single Family Home                                       Social Determinants of Health (SDOH) Interventions SDOH Screenings   Food Insecurity: No Food Insecurity (08/04/2023)  Housing: Low Risk  (08/04/2023)  Transportation Needs: No Transportation Needs (08/04/2023)  Utilities: Not At Risk (08/04/2023)  Depression (PHQ2-9): Low Risk  (07/29/2023)  Tobacco Use: Medium Risk (07/30/2023)    Readmission Risk Interventions    08/04/2023    9:31 AM 10/15/2021   10:43 AM  Readmission Risk Prevention Plan  Transportation Screening Complete Complete  PCP or Specialist Appt within 3-5 Days Complete   HRI or Home Care Consult Complete   Social Work Consult for Recovery Care Planning/Counseling Complete   Palliative Care Screening Complete   Medication Review Oceanographer) Complete Complete  PCP or Specialist appointment within 3-5 days of discharge  Complete  HRI or Home Care Consult  Complete  SW Recovery Care/Counseling Consult  Complete  Palliative Care Screening  Not Applicable  Skilled Nursing Facility  Complete

## 2023-08-08 NOTE — Care Management Important Message (Signed)
Important Message  Patient Details IM Letter given Name: Douglas Walker MRN: [id number] Date of Birth: 02/15/1925   Medicare Important Message Given:  Yes     Caren Macadam 08/08/2023, 4:16 PM

## 2023-08-09 DIAGNOSIS — A419 Sepsis, unspecified organism: Secondary | ICD-10-CM | POA: Diagnosis not present

## 2023-08-09 MED ORDER — NYSTATIN 100000 UNIT/GM EX OINT
TOPICAL_OINTMENT | Freq: Two times a day (BID) | CUTANEOUS | Status: DC
  Filled 2023-08-09: qty 15

## 2023-08-09 NOTE — Progress Notes (Signed)
Mobility Specialist - Progress Note   08/09/23 0942  Mobility  Activity Ambulated with assistance in hallway;Ambulated with assistance to bathroom  Level of Assistance Standby assist, set-up cues, supervision of patient - no hands on  Assistive Device Front wheel walker  Distance Ambulated (ft) 500 ft  Activity Response Tolerated well  Mobility Referral Yes  $Mobility charge 1 Mobility  Mobility Specialist Start Time (ACUTE ONLY) 0920  Mobility Specialist Stop Time (ACUTE ONLY) 0940  Mobility Specialist Time Calculation (min) (ACUTE ONLY) 20 min   Pt received in bed and agreeable to mobility. No complaints during session. Pt fatigued at EOS requiring a seated rest break prior to ambulating to the bathroom. Instructed pt to pull call bell when finished. NT made aware.   [facility]

## 2023-08-09 NOTE — Plan of Care (Signed)
  Problem: Education: Goal: Knowledge of General Education information will improve Description: Including pain rating scale, medication(s)/side effects and non-pharmacologic comfort measures Outcome: Progressing   Problem: Activity: Goal: Risk for activity intolerance will decrease Outcome: Progressing   Problem: Elimination: Goal: Will not experience complications related to bowel motility Outcome: Progressing   Problem: Safety: Goal: Ability to remain free from injury will improve Outcome: Progressing   Problem: Skin Integrity: Goal: Risk for impaired skin integrity will decrease Outcome: Progressing   Problem: Health Behavior/Discharge Planning: Goal: Ability to manage health-related needs will improve Outcome: Adequate for Discharge   Problem: Clinical Measurements: Goal: Ability to maintain clinical measurements within normal limits will improve Outcome: Adequate for Discharge Goal: Will remain free from infection Outcome: Adequate for Discharge Goal: Diagnostic test results will improve Outcome: Adequate for Discharge Goal: Respiratory complications will improve Outcome: Adequate for Discharge Goal: Cardiovascular complication will be avoided Outcome: Adequate for Discharge   Problem: Nutrition: Goal: Adequate nutrition will be maintained Outcome: Adequate for Discharge   Problem: Coping: Goal: Level of anxiety will decrease Outcome: Adequate for Discharge   Problem: Elimination: Goal: Will not experience complications related to urinary retention Outcome: Adequate for Discharge   Problem: Pain Managment: Goal: General experience of comfort will improve Outcome: Adequate for Discharge

## 2023-08-09 NOTE — Progress Notes (Signed)
  Progress Note   Patient: Douglas Walker [id number]:[id number] DOB: 1925/08/06 DOA: 07/30/2023     10 DOS: the patient was seen and examined on 08/09/2023   Brief hospital course: 89 years old male with PMH significant for dementia, chronic diastolic heart failure, s/p pacemaker, essential tremor, hyperlipidemia, paroxysmal A-fib on Eliquis, pulmonary hypertension presented in the ED with fever, confusion, generalized weakness and shaking.  Patient also found to have productive cough and hypoxia requiring supplemental oxygen.  Patient was also hypotensive requiring Levophed support.  Patient was admitted in the ICU for sepsis secondary to community-acquired pneumonia  and recurrent UTI.  He was started on empiric antibiotics. Now off Levophed. TRH pickup 08/01/2023  Assessment and Plan: Septic shock sec. to community-acquired pneumonia /recurrent UTI: Patient does have history of multidrug-resistant UTI on chronic Macrobid therapy. Patient was hypotensive requiring Levophed support to keep MAP goal above 65. Lactic acid resolved with IV hydration.   Patient was initiated on meropenem and azithromycin as per ID. Blood pressure has improved and remains off pressors Weaning stress steroids, currently 25mg  solucortef IV Midodrine was discontinued Pt completed 7 days of uansyn per ID recs as of 8/6 Legionella antigen, Strep Pneumo negative. -SNF placement remains pending   Acute on chronic kidney disease stage IIIa: Baseline Serum creatinine 1.2- 1.3  slightly increased 2.08 Continue IV fluid resuscitation.  Avoid nephrotoxic medications. Serum creatinine improving   Acute encephalopathy secondary to sepsis: Mental status has improved. Patient does have history of dementia. As per son patient at baseline can speak but less talkative from prior stroke.   Obtain CT head to r/o acute change If mental status changes. Speech and swallow eval recommended nectar thick liquids.   -conversing  appropriately this AM   Atrial fibrillation: Heart rate is well-controlled.   Continue Eliquis.   Hypothyroidism: continue levothyroxine.   Chronic pain: Continue Cymbalta and Lyrica.   Dementia: No behavioral problem.  Continue to monitor.      Subjective: No complaints today  Physical Exam: Vitals:   08/08/23 2010 08/09/23 0450 08/09/23 0713 08/09/23 1229  BP: 133/65 (!) 168/80  (!) 149/68  Pulse: 70 70  69  Resp: 18 18  17   Temp: 98 F (36.7 C) 98.2 F (36.8 C)  98.3 F (36.8 C)  TempSrc: Oral Oral  Oral  SpO2: 97% 90%  100%  Weight:   90.8 kg   Height:       General exam: Conversant, in no acute distress Respiratory system: normal chest rise, clear, no audible wheezing Cardiovascular system: regular rhythm, s1-s2 Gastrointestinal system: Nondistended, nontender, pos BS Central nervous system: No seizures, no tremors Extremities: No cyanosis, no joint deformities Skin: No rashes, erythema over sacral region with localized patch of dark bluish skin Psychiatry: Affect normal // no auditory hallucinations   Data Reviewed:  There are no new results to review at this time.  Family Communication: Pt in room, family not at bedside  Disposition: Status is: Inpatient Remains inpatient appropriate because: severity of illness  Planned Discharge Destination: Skilled nursing facility    Author: Rickey Barbara, MD 08/09/2023 3:19 PM  For on call review [email or web address].

## 2023-08-10 DIAGNOSIS — A419 Sepsis, unspecified organism: Secondary | ICD-10-CM | POA: Diagnosis not present

## 2023-08-10 LAB — CBC
HCT: 29.6 % — ABNORMAL LOW (ref 39.0–52.0)
Hemoglobin: 9.8 g/dL — ABNORMAL LOW (ref 13.0–17.0)
MCH: 31.2 pg (ref 26.0–34.0)
MCHC: 33.1 g/dL (ref 30.0–36.0)
MCV: 94.3 fL (ref 80.0–100.0)
Platelets: 141 10*3/uL — ABNORMAL LOW (ref 150–400)
RBC: 3.14 MIL/uL — ABNORMAL LOW (ref 4.22–5.81)
RDW: 16.5 % — ABNORMAL HIGH (ref 11.5–15.5)
WBC: 8.1 10*3/uL (ref 4.0–10.5)
nRBC: 0 % (ref 0.0–0.2)

## 2023-08-10 LAB — COMPREHENSIVE METABOLIC PANEL WITH GFR
ALT: 22 U/L (ref 0–44)
AST: 18 U/L (ref 15–41)
Albumin: 2.6 g/dL — ABNORMAL LOW (ref 3.5–5.0)
Alkaline Phosphatase: 50 U/L (ref 38–126)
Anion gap: 10 (ref 5–15)
BUN: 19 mg/dL (ref 8–23)
CO2: 23 mmol/L (ref 22–32)
Calcium: 8.6 mg/dL — ABNORMAL LOW (ref 8.9–10.3)
Chloride: 105 mmol/L (ref 98–111)
Creatinine, Ser: 1.29 mg/dL — ABNORMAL HIGH (ref 0.61–1.24)
GFR, Estimated: 51 mL/min — ABNORMAL LOW (ref >60)
Glucose, Bld: 86 mg/dL (ref 70–99)
Potassium: 3.4 mmol/L — ABNORMAL LOW (ref 3.5–5.1)
Sodium: 138 mmol/L (ref 135–145)
Total Bilirubin: 0.6 mg/dL (ref 0.3–1.2)
Total Protein: 6.1 g/dL — ABNORMAL LOW (ref 6.5–8.1)

## 2023-08-10 MED ORDER — POTASSIUM CHLORIDE CRYS ER 20 MEQ PO TBCR
60.0000 meq | EXTENDED_RELEASE_TABLET | Freq: Once | ORAL | Status: AC
  Administered 2023-08-10: 60 meq via ORAL
  Filled 2023-08-10: qty 3

## 2023-08-10 MED ORDER — ZINC OXIDE 40 % EX OINT
TOPICAL_OINTMENT | Freq: Every day | CUTANEOUS | Status: DC
  Filled 2023-08-10: qty 57

## 2023-08-10 NOTE — Plan of Care (Signed)
  Problem: Skin Integrity: Goal: Risk for impaired skin integrity will decrease Outcome: Progressing   Problem: Education: Goal: Knowledge of General Education information will improve Description: Including pain rating scale, medication(s)/side effects and non-pharmacologic comfort measures Outcome: Adequate for Discharge   Problem: Clinical Measurements: Goal: Ability to maintain clinical measurements within normal limits will improve Outcome: Adequate for Discharge Goal: Will remain free from infection Outcome: Adequate for Discharge Goal: Respiratory complications will improve Outcome: Adequate for Discharge Goal: Cardiovascular complication will be avoided Outcome: Adequate for Discharge   Problem: Activity: Goal: Risk for activity intolerance will decrease Outcome: Adequate for Discharge   Problem: Coping: Goal: Level of anxiety will decrease Outcome: Adequate for Discharge   Problem: Elimination: Goal: Will not experience complications related to bowel motility Outcome: Adequate for Discharge Goal: Will not experience complications related to urinary retention Outcome: Adequate for Discharge   Problem: Pain Managment: Goal: General experience of comfort will improve Outcome: Adequate for Discharge   Problem: Safety: Goal: Ability to remain free from injury will improve Outcome: Adequate for Discharge

## 2023-08-10 NOTE — Progress Notes (Signed)
  Progress Note   Patient: Douglas Walker GNF:[id number] DOB: [birth year] DOA: 07/30/2023     11 DOS: the patient was seen and examined on 08/10/2023   Brief hospital course: 89 years old male with PMH significant for dementia, chronic diastolic heart failure, s/p pacemaker, essential tremor, hyperlipidemia, paroxysmal A-fib on Eliquis, pulmonary hypertension presented in the ED with fever, confusion, generalized weakness and shaking.  Patient also found to have productive cough and hypoxia requiring supplemental oxygen.  Patient was also hypotensive requiring Levophed support.  Patient was admitted in the ICU for sepsis secondary to community-acquired pneumonia  and recurrent UTI.  He was started on empiric antibiotics. Now off Levophed. TRH pickup 08/01/2023  Assessment and Plan: Septic shock sec. to community-acquired pneumonia /recurrent UTI: Patient does have history of multidrug-resistant UTI on chronic Macrobid therapy. Patient was hypotensive requiring Levophed support to keep MAP goal above 65. Lactic acid resolved with IV hydration.   Patient was initiated on meropenem and azithromycin as per ID. Blood pressure has improved and remains off pressors Weaning stress steroids, currently 25mg  solucortef IV Midodrine was discontinued Pt completed 7 days of uansyn per ID recs as of 8/6 Legionella antigen, Strep Pneumo negative. -SNF placement is pending   Acute on chronic kidney disease stage IIIa: Baseline Serum creatinine 1.2- 1.3  slightly increased 2.08 Continue IV fluid resuscitation.  Avoid nephrotoxic medications. Serum creatinine continues to improve   Acute encephalopathy secondary to sepsis: Mental status has improved. Patient does have history of dementia. As per son patient at baseline can speak but less talkative from prior stroke.   Obtain CT head to r/o acute change If mental status changes. Speech and swallow eval recommended nectar thick liquids.   -resolved    Atrial fibrillation: Heart rate is well-controlled.   Continue Eliquis.   Hypothyroidism: continue levothyroxine.   Chronic pain: Continue Cymbalta and Lyrica.   Dementia: No behavioral problem.  Continue to monitor.      Subjective: Without complaints. Very eager to be discharged  Physical Exam: Vitals:   08/09/23 2112 08/10/23 0432 08/10/23 0716 08/10/23 1230  BP: (!) 144/76 (!) 147/80  131/65  Pulse: 69 69  70  Resp: 18 18  18   Temp: 98.9 F (37.2 C) 98 F (36.7 C)  98.4 F (36.9 C)  TempSrc: Oral Oral  Oral  SpO2: 96% 93%  98%  Weight:   92.6 kg   Height:       General exam: Awake, laying in bed, in nad Respiratory system: Normal respiratory effort, no wheezing Cardiovascular system: regular rate, s1, s2 Gastrointestinal system: Soft, nondistended, positive BS Central nervous system: CN2-12 grossly intact, strength intact Extremities: Perfused, no clubbing Skin: Normal skin turgor, no notable skin lesions seen Psychiatry: Mood normal // no visual hallucinations   Data Reviewed:  Labs reviewed: Na 138, K 3.4, Cr 1.29, WBC 8.1, Hgb 9.8  Family Communication: Pt in room, family not at bedside  Disposition: Status is: Inpatient Remains inpatient appropriate because: severity of illness  Planned Discharge Destination: Skilled nursing facility    Author: Rickey Barbara, MD 08/10/2023 4:04 PM  For on call review [email or web address].

## 2023-08-11 DIAGNOSIS — A419 Sepsis, unspecified organism: Secondary | ICD-10-CM | POA: Diagnosis not present

## 2023-08-11 MED ORDER — ZINC OXIDE 40 % EX OINT
TOPICAL_OINTMENT | Freq: Two times a day (BID) | CUTANEOUS | Status: DC
  Administered 2023-08-12: 1 via TOPICAL
  Filled 2023-08-11: qty 57

## 2023-08-11 MED ORDER — MEDIHONEY WOUND/BURN DRESSING EX PSTE
1.0000 | PASTE | Freq: Every day | CUTANEOUS | Status: DC
  Administered 2023-08-11 – 2023-08-12 (×2): 1 via TOPICAL
  Filled 2023-08-11: qty 44

## 2023-08-11 NOTE — Progress Notes (Signed)
  Progress Note   Patient: Douglas Walker JOA:[id number] DOB: 08-Jan-1925 DOA: 07/30/2023     12 DOS: the patient was seen and examined on 08/11/2023   Brief hospital course: 89 years old male with PMH significant for dementia, chronic diastolic heart failure, s/p pacemaker, essential tremor, hyperlipidemia, paroxysmal A-fib on Eliquis, pulmonary hypertension presented in the ED with fever, confusion, generalized weakness and shaking.  Patient also found to have productive cough and hypoxia requiring supplemental oxygen.  Patient was also hypotensive requiring Levophed support.  Patient was admitted in the ICU for sepsis secondary to community-acquired pneumonia  and recurrent UTI.  He was started on empiric antibiotics. Now off Levophed. TRH pickup 08/01/2023  Assessment and Plan: Septic shock sec. to community-acquired pneumonia /recurrent UTI: Patient does have history of multidrug-resistant UTI on chronic Macrobid therapy. Patient was hypotensive requiring Levophed support to keep MAP goal above 65. Lactic acid resolved with IV hydration.   Patient was initiated on meropenem and azithromycin as per ID. Blood pressure has improved and remains off pressors Weaning stress steroids, currently 25mg  solucortef IV Midodrine was discontinued Pt completed 7 days of uansyn per ID recs as of 8/6 Legionella antigen, Strep Pneumo negative. -Insurance reportedly denied SNF, appeal underway   Acute on chronic kidney disease stage IIIa: Baseline Serum creatinine 1.2- 1.3  slightly increased 2.08 Continue IV fluid resuscitation.  Avoid nephrotoxic medications. Serum creatinine continues to improve   Acute encephalopathy secondary to sepsis: Mental status has improved. Patient does have history of dementia. As per son patient at baseline can speak but less talkative from prior stroke.   Obtain CT head to r/o acute change If mental status changes. Speech and swallow eval recommended nectar thick  liquids.   -resolved   Atrial fibrillation: Heart rate is well-controlled.   Continue Eliquis.   Hypothyroidism: continue levothyroxine.   Chronic pain: Continue Cymbalta and Lyrica.   Dementia: No behavioral problem.  Continue to monitor.      Subjective: No complaints today  Physical Exam: Vitals:   08/10/23 1230 08/10/23 1948 08/11/23 0513 08/11/23 1318  BP: 131/65 139/79 (!) 144/82 (!) 145/71  Pulse: 70 69 68 69  Resp: 18 13 13 17   Temp: 98.4 F (36.9 C) 98.7 F (37.1 C) 97.6 F (36.4 C) 98.1 F (36.7 C)  TempSrc: Oral Oral Oral Oral  SpO2: 98% 97% 94% 99%  Weight:      Height:       General exam: Conversant, in no acute distress Respiratory system: normal chest rise, clear, no audible wheezing Cardiovascular system: regular rhythm, s1-s2 Gastrointestinal system: Nondistended, nontender, pos BS Central nervous system: No seizures, no tremors Extremities: No cyanosis, no joint deformities Skin: No rashes, no pallor Psychiatry: Affect normal // no auditory hallucinations   Data Reviewed:  There are no new results to review at this time.  Family Communication: Pt in room, family not at bedside  Disposition: Status is: Inpatient Remains inpatient appropriate because: severity of illness  Planned Discharge Destination: Skilled nursing facility    Author: Rickey Barbara, MD 08/11/2023 5:10 PM  For on call review [email or web address]

## 2023-08-11 NOTE — Progress Notes (Signed)
Physical Therapy Treatment Patient Details Name: Douglas Walker MRN: [id number] DOB: 09-01-1925 Today's Date: 08/11/2023   History of Present Illness Pt is 89 years old male presented in the ED with fever, confusion, generalized weakness and shaking and admitted 07/30/23 for Septic shock secondary to community-acquired pneumonia /recurrent UTI.  PMH significant for but not limited to: dementia, chronic diastolic heart failure, s/p pacemaker, essential tremor primarily involving the lips/oral musculature, hyperlipidemia, paroxysmal A-fib on Eliquis, pulmonary hypertension, chronic foley catheter, CVA, neuropathy    PT Comments  Pt doing well with goals met and updated.  Noting that insurance denied SNF at d/c.  From PT perspective, pt progressing well, near baseline, met challenges with therapy today that family requested (see below), has 24 hr supervision at home (family confirmed).  Pt does have fall risk including 89 yo, low vision/blindness, neuropathy but appears to be near baseline and moving safely with supervision at this time. Considering this- updating recommendations to HHPT.     Spoke with Douglas Walker on phone per their request prior to treatment.  They expressed several concerns about pt returning home.  One concern is that pt will do well here b/c he knows what he has to do to go home and then not do well at home, barely walk 20', near falls, and fatigues.  Discussed he has been consistently doing well with nursing, therapy, and mobility (not just one day walking 200').  He has walked 500' with mobility and NT, goes to bathroom with supervision in room, and asking to walk multiple times daily.  They also expressed that he would walk with therapy then sleep rest of day earlier in admission - today pt ambulated 400' with NT earlier and was requesting to walk again with therapy this afternoon.  He worked 30 mins with therapy including another 400' walk.  Another concern from family was  that "yes pt can walk, but he has low standing tolerance for showers."  They requested having pt just stand at window or like taking shower.  During session had pt stand 5 mins at hallway window, stood 2 mins while therapy fixed bed, and stood 4-5 mins while acting like taking a shower.  Do advise shower chair which they have but he declines using.  They also report that he may have difficulty standing from chairs at home and request he try from low straight back chair in room without armrest - pt was able to do this at least 3 x with supervision.  When addressing concerns about returning home, asked them about options for ALF or aides, and they expressed concern over low vision/blindness in new environment.  This concern is understandable; pt was able to navigate in hospital room and around obstacles without assist/cues during session so potential could be able to learn new environment if needed.    If plan is discharge home, recommend the following: A little help with walking and/or transfers;A little help with bathing/dressing/bathroom;Help with stairs or ramp for entrance;Assistance with cooking/housework   Can travel by private vehicle     Yes  Equipment Recommendations  None recommended by PT    Recommendations for Other Services       Precautions / Restrictions Precautions Precautions: Fall Precaution Comments: blind right eye, HOH even with hearing aides     Mobility  Bed Mobility Overal bed mobility: Needs Assistance Bed Mobility: Supine to Sit, Sit to Supine     Supine to sit: Modified independent (Device/Increase time) Sit to supine: Modified  independent (Device/Increase time)   General bed mobility comments: Increased time but no assist and did not use rails; HOB at 10 degrees    Transfers Overall transfer level: Needs assistance Equipment used: Rolling walker (2 wheels) Transfers: Sit to/from Stand Sit to Stand: Supervision           General transfer comment: Pt  stood from bed (low height) and from the low height straight back chair in room no armrest x 3 with supervision (only difficulty he had was making sure his hand were not on his gown which he corrected himself).    Ambulation/Gait Ambulation/Gait assistance: Supervision Gait Distance (Feet): 400 Feet Assistive device: Rolling walker (2 wheels) Gait Pattern/deviations: Step-through pattern Gait velocity: normal     General Gait Details: Pt slightly flexed trunk.  Supervision for safety.  Pt was able to navigate around obstacles/furniture in room, crowded hallway, over uneven joint in floor, and around unexpected obstacles (someone stepped out of room, someone pushed back in desk chair) all without cues   Stairs Stairs:  (no stairs to enter home)           Wheelchair Mobility     Tilt Bed    Modified Rankin (Stroke Patients Only)       Balance Overall balance assessment: Needs assistance, History of Falls Sitting-balance support: Feet supported Sitting balance-Leahy Scale: Good     Standing balance support: Bilateral upper extremity supported, No upper extremity supported   Standing balance comment: Pt using RW to ambulate but could static stand wihtout AD.  Pt stood at window 5 mins (per family request to assess standing tolerance), additionally stood 2 mins while therapist fixed bed), acted out showering motions in standing without UE support(see below)(               High Level Balance Comments: Had pt stand in bathroom by sink.  Stood here 4-5 mins.  Had him move arms to scrub hair, body, back and down to lower legs like taking a shower, He did grab walker when leaning forward but reports having multiple grab bars in shower.  Had pt close eyes and stand without UE support - did well with minimal sway. Had him close eyes and act like scrubbing hair in shower -again minimal sway.            Cognition Arousal: Alert Behavior During Therapy: WFL for tasks  assessed/performed Overall Cognitive Status: History of cognitive impairments - at baseline                                 General Comments: Pt was pleasant with therapy and cooperative.  He followed all commands and demonstrated good safety awareness (waiting for walker, watching out for foley catheter line).  Pt became tearful when standing at window facing [facility] stating he used to take his wife there.        Exercises      General Comments        Pertinent Vitals/Pain Pain Assessment Pain Assessment: No/denies pain    Home Living                          Prior Function            PT Goals (current goals can now be found in the care plan section) Acute Rehab PT Goals PT Goal Formulation: With patient/family Time For Goal  Achievement: 08/25/23 Potential to Achieve Goals: Good Progress towards PT goals: Goals met and updated - see care plan    Frequency    Min 1X/week      PT Plan Discharge plan needs to be updated    Co-evaluation              AM-PAC PT "6 Clicks" Mobility   Outcome Measure  Help needed turning from your back to your side while in a flat bed without using bedrails?: None Help needed moving from lying on your back to sitting on the side of a flat bed without using bedrails?: None Help needed moving to and from a bed to a chair (including a wheelchair)?: A Little Help needed standing up from a chair using your arms (e.g., wheelchair or bedside chair)?: A Little Help needed to walk in hospital room?: A Little Help needed climbing 3-5 steps with a railing? : A Little 6 Click Score: 20    End of Session Equipment Utilized During Treatment: Gait belt Activity Tolerance: Patient tolerated treatment well Patient left: in bed;with call bell/phone within reach;with bed alarm set (declined SCD) Nurse Communication: Mobility status PT Visit Diagnosis: Difficulty in walking, not elsewhere classified  (R26.2);Muscle weakness (generalized) (M62.81)     Time: 1410-1440 PT Time Calculation (min) (ACUTE ONLY): 30 min  Charges:    $Gait Training: 8-22 mins $Therapeutic Activity: 8-22 mins PT General Charges $$ ACUTE PT VISIT: 1 Visit                     Anise Salvo, PT Acute Rehab Services [facility] [phone]    Rayetta Humphrey 08/11/2023, 3:22 PM

## 2023-08-11 NOTE — TOC Progression Note (Addendum)
Transition of Care [facility]) - Progression Note    Patient Details  Name: KENER CABAL MRN: [id number] Date of Birth: February 02, 1925  Transition of Care [facility]) CM/SW Contact  Howell Rucks, RN Phone Number: 08/11/2023, 10:20 AM  Clinical Narrative:  Joanne Chars, CM, will initiate insurance appeal, await determination.    -11:08pm, Teams chat received from PT, plan to see pt today, will follow for PT recommendation update   -11:09pm Call received from pt's son, Brett Canales, confirming he would like to purse Appeal for denial of short term rehab request. NCM explained to Brett Canales that the Appeal is on hold until PT session today with possible update on recommendation. Brett Canales reports family is currently unable to care for pt at home, inquired about out of pocket cost for short term rehab. NCM advised that payment is required upfront, Brett Canales voiced understanding. NCM will update Brett Canales after PT session today.   -3:35pm PT session completed, recommendation changed to [facility] PT. Call to pt's son, Brett Canales, discussed PT recommendation, reports they are interested in self pay for short term rehab, confirmed will still like [facility], NCM will reach out to [facility] to obtain information on the process for admission with self pay.   -3;41pm Call to [employer] main number [phone], requested to speak with admissions coordinator, operator Victorino Dike) took [facility] name and phone number to give to [facility], facility admissions coordinator, await cal back .   -4:11pm Call receive from [facility], admissions coordinator with [facility], informed pt's family interested in private pay for short term rehab, Alvino Chapel requested to have family contact her at [phone]. Call to pt's home phone, sw pt's spouse, provided  Ellen's name and phone at [facility] place to call regarding pvt pay for short term rehab, await family decision./      Expected Discharge Plan: Skilled Nursing Facility Barriers to Discharge: Insurance  Authorization  Expected Discharge Plan and Services   Discharge Planning Services: CM Consult Post Acute Care Choice: Skilled Nursing Facility Living arrangements for the past 2 months: Single Family Home                                       Social Determinants of Health (SDOH) Interventions SDOH Screenings   Food Insecurity: No Food Insecurity (08/04/2023)  Housing: Low Risk  (08/04/2023)  Transportation Needs: No Transportation Needs (08/04/2023)  Utilities: Not At Risk (08/04/2023)  Depression (PHQ2-9): Low Risk  (07/29/2023)  Tobacco Use: Medium Risk (07/30/2023)    Readmission Risk Interventions    08/04/2023    9:31 AM 10/15/2021   10:43 AM  Readmission Risk Prevention Plan  Transportation Screening Complete Complete  PCP or Specialist Appt within 3-5 Days Complete   HRI or Home Care Consult Complete   Social Work Consult for Recovery Care Planning/Counseling Complete   Palliative Care Screening Complete   Medication Review Oceanographer) Complete Complete  PCP or Specialist appointment within 3-5 days of discharge  Complete  HRI or Home Care Consult  Complete  SW Recovery Care/Counseling Consult  Complete  Palliative Care Screening  Not Applicable  Skilled Nursing Facility  Complete

## 2023-08-11 NOTE — Consult Note (Signed)
WOC Nurse Consult Note: Reason for Consult: R hip/sacrum Deep Tissue Pressure Injury  Wound type: 1.  Full thickness R hip of unknown etiology, not felt to be pressure  2.  Moisture Associated Skin Damage d/t moisture and friction intergluteal cleft/coccyx/buttocks  3.  Sacrum light red/purple discoloration that patient says is a birth mark, does not appear to be Deep Tissue Pressure Injury  4. Area of ? purpura to L upper back, scattered black purple bruises, skin intact (patient is on Eliquis)  ICD-10 CM Codes for Irritant Dermatitis L24A0 - Due to friction or contact with body fluids; unspecified  Pressure Injury POA: NA, not related to pressure  Measurement:1. Full thickness R hip 2 cm x 2 cm raised area with erythema with 0.5 cm x 0.5 cm area of tan slough in middle 2.  Coccyx/buttocks/intergluteal cleft 8 cm x 7 cm area of Moisture Associated Skin Damage erythema with scattered areas of partial thickness skin loss including linear partial thickness skin loss to intergluteal cleft 100% pink and moist   Drainage (amount, consistency, odor) minimal tan to R hip wound, serosanguinous to coccyx/buttocks/intergluteal cleft  Periwound: Dressing procedure/placement/frequency:  Clean R hip wound with NS, apply Medihoney to necrotic (tan/yellow) tissue daily, cover with dry gauze and silicone foam.  May lift foam daily to replace Medihoney.  Change foam dressing q3 days and prn soiling.  Clean buttocks/coccyx/intergluteal cleft with soap and water, dry and apply a thin layer of Desitin 2 times daily and prn soiling to dry and protect this area.    POC discussed with patient and bedside nurse.  WOC team will not follow at this time. Re-consult if further needs arise.   Thank you,    Priscella Mann MSN, RN-BC, [employer] [phone]

## 2023-08-12 DIAGNOSIS — A419 Sepsis, unspecified organism: Secondary | ICD-10-CM | POA: Diagnosis not present

## 2023-08-12 MED ORDER — MEDIHONEY WOUND/BURN DRESSING EX PSTE: 1.0000 | PASTE | Freq: Every day | CUTANEOUS | 0 refills | Status: DC

## 2023-08-12 MED ORDER — TRAMADOL HCL 50 MG PO TABS: 50.0000 mg | ORAL_TABLET | Freq: Two times a day (BID) | ORAL | 0 refills | Status: DC

## 2023-08-12 MED ORDER — NYSTATIN 100000 UNIT/GM EX OINT: TOPICAL_OINTMENT | Freq: Two times a day (BID) | CUTANEOUS | 0 refills | Status: DC

## 2023-08-12 MED ORDER — ZINC OXIDE 40 % EX OINT: TOPICAL_OINTMENT | Freq: Two times a day (BID) | CUTANEOUS | 0 refills | Status: DC

## 2023-08-12 NOTE — TOC Progression Note (Addendum)
Transition of Care [facility]) - Progression Note    Patient Details  Name: Douglas Walker MRN: [id number] Date of Birth: 08/29/1925  Transition of Care [facility]) CM/SW Contact  , Olegario Messier, RN Phone Number: 08/12/2023, 10:00 AM  Clinical Narrative:  [address]-left vm w/admissions/administrator rep Alvino Chapel [phone] await call back also left vm w/Michelle #[phone] rep await call back. Called main Alcide Clever tel#[phone] spoke to [facility] will contact Alvino Chapel for call back to [facility] & Carla(spouse) [phone]. Sent message to insurance for enial letter to be faxed to [phone]([facility] fax#). Albin Felling (spouse) aware of denial-they are in agreement to private pay @ [name]-awaitiing response from [address] MD updated. -1:49p left vm w/Ellen adm/administrator-await call back;spoke to Rashanda-who idnt have patient on her list-informed office famiy says they have paid the required fee. Called back to [employer] main tel# [phone]-Jennifer will try to contact SW to call me back & also inform Alvino Chapel to call me back. Awaiting if patient can d/c today before Dr does d/c summary.    Expected Discharge Plan: Skilled Nursing Facility Barriers to Discharge: No Barriers Identified  Expected Discharge Plan and Services   Discharge Planning Services: CM Consult Post Acute Care Choice: Skilled Nursing Facility Living arrangements for the past 2 months: Single Family Home                                       Social Determinants of Health (SDOH) Interventions SDOH Screenings   Food Insecurity: No Food Insecurity (08/04/2023)  Housing: Low Risk  (08/04/2023)  Transportation Needs: No Transportation Needs (08/04/2023)  Utilities: Not At Risk (08/04/2023)  Depression (PHQ2-9): Low Risk  (07/29/2023)  Tobacco Use: Medium Risk (07/30/2023)    Readmission Risk Interventions    08/04/2023    9:31 AM 10/15/2021   10:43 AM  Readmission Risk Prevention Plan  Transportation Screening  Complete Complete  PCP or Specialist Appt within 3-5 Days Complete   HRI or Home Care Consult Complete   Social Work Consult for Recovery Care Planning/Counseling Complete   Palliative Care Screening Complete   Medication Review Oceanographer) Complete Complete  PCP or Specialist appointment within 3-5 days of discharge  Complete  HRI or Home Care Consult  Complete  SW Recovery Care/Counseling Consult  Complete  Palliative Care Screening  Not Applicable  Skilled Nursing Facility  Complete

## 2023-08-12 NOTE — Discharge Summary (Addendum)
Physician Discharge Summary   Patient: Douglas Walker MRN: [id number] DOB: 08/20/1925  Admit date:     07/30/2023  Discharge date: 08/12/23  Discharge Physician: Rickey Barbara   PCP: Joya Martyr, MD   Recommendations at discharge:    Follow up with PCP in 1-2 weeks WOC recs as follows: Dressing procedure/placement/frequency:  Clean R hip wound with NS, apply Medihoney to necrotic (tan/yellow) tissue daily, cover with dry gauze and silicone foam.  May lift foam daily to replace Medihoney.  Change foam dressing q3 days and prn soiling.  Clean buttocks/coccyx/intergluteal cleft with soap and water, dry and apply a thin layer of Desitin 2 times daily and prn soiling to dry and protect this area.  Discharge Diagnoses: Principal Problem:   Sepsis (HCC) Active Problems:   Septic shock (HCC)  Resolved Problems:   * No resolved hospital problems. *  Hospital Course: 89 years old male with PMH significant for dementia, chronic diastolic heart failure, s/p pacemaker, essential tremor, hyperlipidemia, paroxysmal A-fib on Eliquis, pulmonary hypertension presented in the ED with fever, confusion, generalized weakness and shaking.  Patient also found to have productive cough and hypoxia requiring supplemental oxygen.  Patient was also hypotensive requiring Levophed support.  Patient was admitted in the ICU for sepsis secondary to community-acquired pneumonia  and recurrent UTI.  He was started on empiric antibiotics. Now off Levophed. TRH pickup 08/01/2023  Assessment and Plan: Septic shock sec. to community-acquired pneumonia /recurrent UTI: Patient does have history of multidrug-resistant UTI on chronic Macrobid therapy. Patient was initially hypotensive requiring Levophed support to keep MAP goal above 65. Lactic acid resolved with IV hydration.   Initially on meropenem and azithromycin as per ID Blood pressure has improved and off pressors Weaned off stress steroids Midodrine was  discontinued Pt completed 7 days of uansyn per ID recs as of 8/6 Legionella antigen, Strep Pneumo negative. Plan d/c to SNF   Acute on chronic kidney disease stage IIIa: Baseline Serum creatinine 1.2- 1.3  Serum creatinine continues to improve   Acute encephalopathy secondary to sepsis: Mental status has improved. Patient does have history of dementia. As per son patient at baseline can speak but less talkative from prior stroke.   Obtain CT head to r/o acute change If mental status changes. Speech and swallow eval recommended nectar thick liquids.   -resolved   Atrial fibrillation: Heart rate is well-controlled.   Continue Eliquis.   Hypothyroidism: continue levothyroxine.   Chronic pain: Continue Cymbalta and Lyrica.   Dementia: No behavioral problem.    R hip/sacrum Deep Tissue Pressure Injury POA -WOC recs as follows: Dressing procedure/placement/frequency:  Clean R hip wound with NS, apply Medihoney to necrotic (tan/yellow) tissue daily, cover with dry gauze and silicone foam.  May lift foam daily to replace Medihoney.  Change foam dressing q3 days and prn soiling.  Clean buttocks/coccyx/intergluteal cleft with soap and water, dry and apply a thin layer of Desitin 2 times daily and prn soiling to dry and protect this area.  R sided facial rash -suspect fungal rash -given trial of typical nystatin to this area, improving       Consultants: ID Procedures performed:   Disposition: Skilled nursing facility Diet recommendation:  Dysphagia type 3 Thin Liquid DISCHARGE MEDICATION: Allergies as of 08/12/2023       Reactions   Sulfa Antibiotics Other (See Comments)   Weakness Dizziness  Sweats   Mysoline [primidone] Other (See Comments)   Sedation    Zocor [simvastatin] Other (See  Comments)   Arthralgias Fatigue        Medication List     STOP taking these medications    furosemide 40 MG tablet Commonly known as: LASIX   midodrine 2.5 MG  tablet Commonly known as: PROAMATINE   nitrofurantoin (macrocrystal-monohydrate) 100 MG capsule Commonly known as: MACROBID   predniSONE 5 MG tablet Commonly known as: DELTASONE   topiramate 50 MG tablet Commonly known as: TOPAMAX       TAKE these medications    acetaminophen 325 MG tablet Commonly known as: TYLENOL Take 2 tablets (650 mg total) by mouth every 4 (four) hours as needed for mild pain (or temp > 37.5 C (99.5 F)).   apixaban 2.5 MG Tabs tablet Commonly known as: ELIQUIS Take 1 tablet (2.5 mg total) by mouth 2 (two) times daily.   cyanocobalamin 1000 MCG tablet Commonly known as: VITAMIN B12 Take 1 tablet (1,000 mcg total) by mouth every other day. What changed:  when to take this additional instructions   DULoxetine 60 MG capsule Commonly known as: Cymbalta Take 1 capsule (60 mg total) by mouth daily.   leptospermum manuka honey Pste paste Apply 1 Application topically daily. To hip wound Start taking on: August 13, 2023   levothyroxine 25 MCG tablet Commonly known as: SYNTHROID Take 1 tablet (25 mcg total) by mouth daily before breakfast.   liver oil-zinc oxide 40 % ointment Commonly known as: DESITIN Apply topically 2 (two) times daily. To sacral region   loratadine 10 MG tablet Commonly known as: CLARITIN Take 10 mg by mouth daily.   multivitamin with minerals Tabs tablet Take 1 tablet by mouth daily.   nystatin ointment Commonly known as: MYCOSTATIN Apply topically 2 (two) times daily. Apply to rash over R face   Ocuvite Adult 50+ Caps Take 1 capsule by mouth daily.   polyethylene glycol 17 g packet Commonly known as: MIRALAX / GLYCOLAX Take 17 g by mouth daily. What changed:  when to take this reasons to take this   pregabalin 50 MG capsule Commonly known as: LYRICA Take 50 mg by mouth at bedtime.   traMADol 50 MG tablet Commonly known as: ULTRAM Take 1 tablet (50 mg total) by mouth 2 (two) times daily.   vitamin C 1000 MG  tablet Take 1,000 mg by mouth daily.        Contact information for after-discharge care     Destination     [facility] Preferred SNF .   Service: Skilled Nursing Contact information: [address] [place] [address] [phone]                    Discharge Exam: Ceasar Mons Weights   08/09/23 0713 08/10/23 0716 08/12/23 0500  Weight: 90.8 kg 92.6 kg 88.3 kg   General exam: Awake, laying in bed, in nad Respiratory system: Normal respiratory effort, no wheezing Cardiovascular system: regular rate, s1, s2 Gastrointestinal system: Soft, nondistended, positive BS Central nervous system: CN2-12 grossly intact, strength intact Extremities: Perfused, no clubbing Skin: Normal skin turgor, no notable skin lesions seen Psychiatry: Mood normal // no visual hallucinations   Condition at discharge: fair  The results of significant diagnostics from this hospitalization (including imaging, microbiology, ancillary and laboratory) are listed below for reference.   Imaging Studies: CT HEAD WO CONTRAST ( )  Result Date: 07/31/2023 CLINICAL DATA:  Altered mental status EXAM: CT HEAD WITHOUT CONTRAST TECHNIQUE: Contiguous axial images were obtained from the base of  the skull through the vertex without intravenous contrast. RADIATION DOSE REDUCTION: This exam was performed according to the departmental dose-optimization program which includes automated exposure control, adjustment of the mA and/or kV according to patient size and/or use of iterative reconstruction technique. COMPARISON:  01/02/2023 FINDINGS: Brain: No evidence of acute infarction, hemorrhage, hydrocephalus, extra-axial collection or mass lesion/mass effect. Periventricular and deep white matter hypodensity. Unchanged left frontal encephalomalacia. Vascular: No hyperdense vessel or unexpected calcification. Skull: Normal. Negative for fracture or focal lesion. Sinuses/Orbits:  No acute finding.  Right ocular prosthesis. Other: None. IMPRESSION: No acute intracranial pathology. Small-vessel white matter disease and unchanged left frontal encephalomalacia. Electronically Signed   By: Jearld Lesch M.D.   On: 07/31/2023 10:33   DG Chest Port 1 View  Result Date: 07/30/2023 CLINICAL DATA:  Questionable sepsis - evaluate for abnormality Confusion,shaking, ? Sepsis, hypoxia EXAM: PORTABLE CHEST 1 VIEW.  Patient rotated. COMPARISON:  Chest x-ray 02/26/2023 FINDINGS: Left chest wall 2 lead pacemaker. Limited evaluation of the heart and mediastinal contours due to patient rotation. Aortic calcification. Interval development of diffuse interstitial and airspace opacities. No pleural effusion. No pneumothorax. No acute osseous abnormality. IMPRESSION: 1. Limited evaluation of the heart and mediastinal contours due to patient rotation. Recommend repeat PA and lateral view of the chest for further evaluation. 2. Interval development of diffuse interstitial and airspace opacities. Finding may represent a combination of infection and/or inflammation. 3.  Aortic Atherosclerosis (ICD10-I70.0). Electronically Signed   By: Tish Frederickson M.D.   On: 07/30/2023 03:25   CUP PACEART REMOTE DEVICE CHECK  Result Date: 07/24/2023 Scheduled remote reviewed. Normal device function.  There was one NSVT arrhythmia detected Next remote 91 days. Hassell Halim, RN, CCDS, [employer]   Microbiology: Results for orders placed or performed during the hospital encounter of 07/30/23  Blood Culture (routine x 2)     Status: Abnormal   Collection Time: 07/30/23  2:47 AM   Specimen: BLOOD  Result Value Ref Range Status   Specimen Description   Final    BLOOD RIGHT ANTECUBITAL Performed at [facility], [address], [place], [place] [address]    Special Requests   Final    BOTTLES DRAWN AEROBIC AND ANAEROBIC Blood Culture results may not be optimal due to an excessive volume of  blood received in culture bottles Performed at [facility], [address] [place], [place] [address]    Culture  Setup Time   Final    GRAM NEGATIVE RODS ANAEROBIC BOTTLE ONLY CRITICAL RESULT CALLED TO, READ BACK BY AND VERIFIED WITH: PHARMD MICHELLE BELL ON 08/01/23 @ 1946 BY DRT    Culture (A)  Final    BACTEROIDES THETAIOTAOMICRON BETA LACTAMASE POSITIVE Performed at [facility], [address], [place], [place] [address]    Report Status 08/04/2023 FINAL  Final  Blood Culture ID Panel (Reflexed)     Status: None   Collection Time: 07/30/23  2:47 AM  Result Value Ref Range Status   Enterococcus faecalis NOT DETECTED NOT DETECTED Final   Enterococcus Faecium NOT DETECTED NOT DETECTED Final   Listeria monocytogenes NOT DETECTED NOT DETECTED Final   Staphylococcus species NOT DETECTED NOT DETECTED Final   Staphylococcus aureus (BCID) NOT DETECTED NOT DETECTED Final   Staphylococcus epidermidis NOT DETECTED NOT DETECTED Final   Staphylococcus lugdunensis NOT DETECTED NOT DETECTED Final   Streptococcus species NOT DETECTED NOT DETECTED Final   Streptococcus agalactiae NOT DETECTED NOT DETECTED Final  Streptococcus pneumoniae NOT DETECTED NOT DETECTED Final   Streptococcus pyogenes NOT DETECTED NOT DETECTED Final   A.calcoaceticus-baumannii NOT DETECTED NOT DETECTED Final   Bacteroides fragilis NOT DETECTED NOT DETECTED Final   Enterobacterales NOT DETECTED NOT DETECTED Final   Enterobacter cloacae complex NOT DETECTED NOT DETECTED Final   Escherichia coli NOT DETECTED NOT DETECTED Final   Klebsiella aerogenes NOT DETECTED NOT DETECTED Final   Klebsiella oxytoca NOT DETECTED NOT DETECTED Final   Klebsiella pneumoniae NOT DETECTED NOT DETECTED Final   Proteus species NOT DETECTED NOT DETECTED Final   Salmonella species NOT DETECTED NOT DETECTED Final   Serratia marcescens NOT DETECTED NOT DETECTED Final   Haemophilus influenzae NOT DETECTED NOT DETECTED  Final   Neisseria meningitidis NOT DETECTED NOT DETECTED Final   Pseudomonas aeruginosa NOT DETECTED NOT DETECTED Final   Stenotrophomonas maltophilia NOT DETECTED NOT DETECTED Final   Candida albicans NOT DETECTED NOT DETECTED Final   Candida auris NOT DETECTED NOT DETECTED Final   Candida glabrata NOT DETECTED NOT DETECTED Final   Candida krusei NOT DETECTED NOT DETECTED Final   Candida parapsilosis NOT DETECTED NOT DETECTED Final   Candida tropicalis NOT DETECTED NOT DETECTED Final   Cryptococcus neoformans/gattii NOT DETECTED NOT DETECTED Final    Comment: Performed at [facility], [address], [place], [place] [address]  Resp panel by RT-PCR (RSV, Flu A&B, Covid) Anterior Nasal Swab     Status: None   Collection Time: 07/30/23  2:54 AM   Specimen: Anterior Nasal Swab  Result Value Ref Range Status   SARS Coronavirus 2 by RT PCR NEGATIVE NEGATIVE Final    Comment: (NOTE) SARS-CoV-2 target nucleic acids are NOT DETECTED.  The SARS-CoV-2 RNA is generally detectable in upper respiratory specimens during the acute phase of infection. The lowest concentration of SARS-CoV-2 viral copies this assay can detect is 138 copies/mL. A negative result does not preclude SARS-Cov-2 infection and should not be used as the sole basis for treatment or other patient management decisions. A negative result may occur with  improper specimen collection/handling, submission of specimen other than nasopharyngeal swab, presence of viral mutation(s) within the areas targeted by this assay, and inadequate number of viral copies(<138 copies/mL). A negative result must be combined with clinical observations, patient history, and epidemiological information. The expected result is Negative.  Fact Sheet for Patients:  BloggerCourse.com  Fact Sheet for Healthcare Providers:  SeriousBroker.it  This test is no t yet approved or cleared by the [place] FDA and  has been authorized for detection and/or diagnosis of SARS-CoV-2 by FDA under an Emergency Use Authorization (EUA). This EUA will remain  in effect (meaning this test can be used) for the duration of the COVID-19 declaration under Section 564(b)(1) of the Act, 21 U.S.C.section 360bbb-3(b)(1), unless the authorization is terminated  or revoked sooner.       Influenza A by PCR NEGATIVE NEGATIVE Final   Influenza B by PCR NEGATIVE NEGATIVE Final    Comment: (NOTE) The Xpert Xpress SARS-CoV-2/FLU/RSV plus assay is intended as an aid in the diagnosis of influenza from Nasopharyngeal swab specimens and should not be used as a sole basis for treatment. Nasal washings and aspirates are unacceptable for Xpert Xpress SARS-CoV-2/FLU/RSV testing.  Fact Sheet for Patients: BloggerCourse.com  Fact Sheet for Healthcare Providers: SeriousBroker.it  This test is not yet approved or cleared by the [place] FDA and has been authorized for detection and/or diagnosis of SARS-CoV-2 by FDA under an Emergency  Use Authorization (EUA). This EUA will remain in effect (meaning this test can be used) for the duration of the COVID-19 declaration under Section 564(b)(1) of the Act, 21 U.S.C. section 360bbb-3(b)(1), unless the authorization is terminated or revoked.     Resp Syncytial Virus by PCR NEGATIVE NEGATIVE Final    Comment: (NOTE) Fact Sheet for Patients: BloggerCourse.com  Fact Sheet for Healthcare Providers: SeriousBroker.it  This test is not yet approved or cleared by the [place] FDA and has been authorized for detection and/or diagnosis of SARS-CoV-2 by FDA under an Emergency Use Authorization (EUA). This EUA will remain in effect (meaning this test can be used) for the duration of the COVID-19 declaration under Section 564(b)(1) of the Act, 21 U.S.C. section  360bbb-3(b)(1), unless the authorization is terminated or revoked.  Performed at [facility], [address], [place], [place] [address]   Blood Culture (routine x 2)     Status: None   Collection Time: 07/30/23  3:37 AM   Specimen: BLOOD  Result Value Ref Range Status   Specimen Description   Final    BLOOD BLOOD RIGHT FOREARM Performed at [facility], [address] [place], [place] [address]    Special Requests   Final    BOTTLES DRAWN AEROBIC AND ANAEROBIC Blood Culture results may not be optimal due to an excessive volume of blood received in culture bottles Performed at [facility], [address], [place], [place] [address]    Culture   Final    NO GROWTH 5 DAYS Performed at [facility], [address] [place], [place] [address]    Report Status 08/04/2023 FINAL  Final  Urine Culture     Status: Abnormal   Collection Time: 07/30/23  4:17 AM   Specimen: Urine, Random  Result Value Ref Range Status   Specimen Description   Final    URINE, RANDOM Performed at [facility], [address], [place], [place] [address]    Special Requests   Final    NONE Reflexed from [id number] Performed at [facility], [address] [place], [place] [address]    Culture >=100,000 COLONIES/mL PROTEUS MIRABILIS (A)  Final   Report Status 08/01/2023 FINAL  Final   Organism ID, Bacteria PROTEUS MIRABILIS (A)  Final      Susceptibility   Proteus mirabilis - MIC*    AMPICILLIN <=2 SENSITIVE Sensitive     CEFAZOLIN >=64 RESISTANT Resistant     CEFEPIME <=0.12 SENSITIVE Sensitive     CEFTRIAXONE 32 RESISTANT Resistant     CIPROFLOXACIN <=0.25 SENSITIVE Sensitive     GENTAMICIN <=1 SENSITIVE Sensitive     IMIPENEM 1 SENSITIVE Sensitive     NITROFURANTOIN 64 INTERMEDIATE Intermediate     TRIMETH/SULFA <=20 SENSITIVE Sensitive     AMPICILLIN/SULBACTAM <=2 SENSITIVE Sensitive     PIP/TAZO  <=4 SENSITIVE Sensitive     * >=100,000 COLONIES/mL PROTEUS MIRABILIS  MRSA Next Gen by PCR, Nasal     Status: None   Collection Time: 07/30/23  5:12 AM   Specimen: Nasal Mucosa; Nasal Swab  Result Value Ref Range Status   MRSA by PCR Next Gen NOT DETECTED NOT DETECTED Final    Comment: (NOTE) The GeneXpert MRSA Assay (FDA approved for NASAL specimens only), is one component of a comprehensive MRSA colonization surveillance program. It is not intended to diagnose MRSA infection nor to guide or monitor treatment for MRSA infections. Test  performance is not FDA approved in patients less than 5 years old. Performed at [facility], [address], [place], [place] [address]     Labs: CBC: Recent Labs  Lab 08/06/23 0446 08/07/23 0949 08/10/23 0448  WBC 10.5 7.8 8.1  HGB 10.6* 10.6* 9.8*  HCT 32.3* 33.1* 29.6*  MCV 94.2 94.3 94.3  PLT 132* 143* 141*   Basic Metabolic Panel: Recent Labs  Lab 08/06/23 0446 08/07/23 0949 08/10/23 0448  NA 138 139 138  K 3.7 3.9 3.4*  CL 107 107 105  CO2 23 25 23   GLUCOSE 95 128* 86  BUN 26* 23 19  CREATININE 1.26* 1.34* 1.29*  CALCIUM 8.4* 8.5* 8.6*  MG 2.1  --   --   PHOS 2.7  --   --    Liver Function Tests: Recent Labs  Lab 08/06/23 0446 08/07/23 0949 08/10/23 0448  AST 26 22 18   ALT 26 26 22   ALKPHOS 49 51 50  BILITOT 0.7 0.7 0.6  PROT 6.2* 6.4* 6.1*  ALBUMIN 2.5* 2.7* 2.6*   CBG: No results for input(s): "GLUCAP" in the last 168 hours.  Discharge time spent: less than 30 minutes.  Signed: Rickey Barbara, MD [facility] 08/12/2023

## 2023-08-12 NOTE — Progress Notes (Signed)
Occupational Therapy Treatment Patient Details Name: Douglas Walker MRN: [id number] DOB: [birth year] Today's Date: 08/12/2023   History of present illness Pt is 89 years old male presented in the ED with fever, confusion, generalized weakness and shaking and admitted 07/30/23 for Septic shock secondary to community-acquired pneumonia /recurrent UTI.  PMH significant for but not limited to: dementia, chronic diastolic heart failure, s/p pacemaker, essential tremor primarily involving the lips/oral musculature, hyperlipidemia, paroxysmal A-fib on Eliquis, pulmonary hypertension, chronic foley catheter, CVA, neuropathy   OT comments  This 89 yo male seen today and was able to ambulate a around unit at contact guard A (I did not have to steady him or help him regain his balance; but I did not feel safe just letting him ambulate down the hallway watching from a distance--his low vision definitely had a part to play in this). He is able to move around in his hospital room from the RW level and shows safety in doing so with transfers. He will continue to benefit from acute OT with follow up from continued inpatient follow up therapy, <3 hours/day.       If plan is discharge home, recommend the following:  A little help with bathing/dressing/bathroom;Assistance with cooking/housework;Assist for transportation;Direct supervision/assist for financial management;Direct supervision/assist for medications management;A little help with walking and/or transfers;Assistance with feeding   Equipment Recommendations  None recommended by OT       Precautions / Restrictions Precautions Precautions: Fall Precaution Comments: blind right eye, HOH even with hearing aides Restrictions Weight Bearing Restrictions: No       Mobility Bed Mobility Overal bed mobility: Modified Independent Bed Mobility: Supine to Sit, Sit to Supine     Supine to sit: Modified independent (Device/Increase time), HOB  elevated Sit to supine: Modified independent (Device/Increase time)        Transfers Overall transfer level: Needs assistance Equipment used: Rolling walker (2 wheels) Transfers: Sit to/from Stand Sit to Stand: Contact guard assist           General transfer comment: Pt stood at window at end of hallway and we talked for about 3 minutes then he continued to walk to other side of hospital and back with RW with contatct guard A     Balance Overall balance assessment: Needs assistance, History of Falls Sitting-balance support: No upper extremity supported, Feet supported Sitting balance-Leahy Scale: Good     Standing balance support: Bilateral upper extremity supported Standing balance-Leahy Scale: Poor Standing balance comment: Pt using RW to ambulate but could static stand wihtout AD.  Pt stood at window 3 mins.                           ADL either performed or assessed with clinical judgement   ADL Overall ADL's : Needs assistance/impaired   Eating/Feeding Details (indicate cue type and reason): can drink from cups with straws well; but has tremors that greatly affect his ability to use utensils to eat                     Toilet Transfer: Contact guard assist;Rolling walker (2 wheels) Toilet Transfer Details (indicate cue type and reason): simulated bed>out door and down hallway>back to room>sit in recliner>then back into bed                Extremity/Trunk Assessment Upper Extremity Assessment Upper Extremity Assessment: Overall WFL for tasks assessed  Vision Baseline Vision/History: 2 Legally blind Patient Visual Report: No change from baseline            Cognition Arousal: Alert Behavior During Therapy: WFL for tasks assessed/performed Overall Cognitive Status: History of cognitive impairments - at baseline                                                     Pertinent Vitals/ Pain       Pain  Assessment Pain Assessment: No/denies pain         Frequency  Min 1X/week        Progress Toward Goals  OT Goals(current goals can now be found in the care plan section)  Progress towards OT goals: Progressing toward goals  Acute Rehab OT Goals Patient Stated Goal: to go home OT Goal Formulation: With patient Time For Goal Achievement: 08/18/23 Potential to Achieve Goals: Good         AM-PAC OT "6 Clicks" Daily Activity     Outcome Measure   Help from another person eating meals?: A Little Help from another person taking care of personal grooming?: A Little Help from another person toileting, which includes using toliet, bedpan, or urinal?: A Little Help from another person bathing (including washing, rinsing, drying)?: A Little Help from another person to put on and taking off regular upper body clothing?: A Little Help from another person to put on and taking off regular lower body clothing?: A Little 6 Click Score: 18    End of Session Equipment Utilized During Treatment: Rolling walker (2 wheels);Gait belt  OT Visit Diagnosis: Unsteadiness on feet (R26.81);Muscle weakness (generalized) (M62.81);History of falling (Z91.81)   Activity Tolerance Patient tolerated treatment well   Patient Left in bed;with call bell/phone within reach;with bed alarm set           Time: 1410-1440 OT Time Calculation (min): 30 min  Charges: OT General Charges $OT Visit: 1 Visit OT Treatments $Self Care/Home Management : 23-37 mins Lindon Romp OT Acute Rehabilitation Services Office [phone]    Evette Georges 08/12/2023, 3:49 PM

## 2023-08-12 NOTE — TOC Transition Note (Addendum)
Transition of Care [facility]) - CM/SW Discharge Note   Patient Details  Name: Douglas Walker MRN: [id number] Date of Birth: [birth year]  [facility] of [facility] CM/SW Contact:  Lanier Clam, RN Phone Number: 08/12/2023, 3:27 PM   Clinical Narrative:   d/c [address] rep Terri accepted-going to rm#[id number],tel# report [phone](if busy) call main tel#[phone].DNR. PTAR to be called once d/c summary sent. No further CM needs.PTAR called.    Final next level of care: Skilled Nursing Facility Barriers to Discharge: No Barriers Identified   Patient Goals and CMS Choice      Discharge Placement                Patient chooses bed at: [facility] Patient to be transferred to facility by: PTAR Name of family member notified: Carla(spouse) Patient and family notified of of transfer: 08/12/23  Discharge Plan and Services Additional resources added to the After Visit Summary for     Discharge Planning Services: CM Consult Post Acute Care Choice: Skilled Nursing Facility                               Social Determinants of Health (SDOH) Interventions SDOH Screenings   Food Insecurity: No Food Insecurity (08/04/2023)  Housing: Low Risk  (08/04/2023)  Transportation Needs: No Transportation Needs (08/04/2023)  Utilities: Not At Risk (08/04/2023)  Depression (PHQ2-9): Low Risk  (07/29/2023)  Tobacco Use: Medium Risk (07/30/2023)     Readmission Risk Interventions    08/04/2023    9:31 AM 10/15/2021   10:43 AM  Readmission Risk Prevention Plan  Transportation Screening Complete Complete  PCP or Specialist Appt within 3-5 Days Complete   HRI or Home Care Consult Complete   Social Work Consult for Recovery Care Planning/Counseling Complete   Palliative Care Screening Complete   Medication Review Oceanographer) Complete Complete  PCP or Specialist appointment within 3-5 days of discharge  Complete  HRI or Home Care Consult  Complete  SW Recovery Care/Counseling  Consult  Complete  Palliative Care Screening  Not Applicable  Skilled Nursing Facility  Complete

## 2023-08-12 NOTE — Plan of Care (Signed)
  Problem: Education: Goal: Knowledge of General Education information will improve Description: Including pain rating scale, medication(s)/side effects and non-pharmacologic comfort measures Outcome: Completed/Met   Problem: Health Behavior/Discharge Planning: Goal: Ability to manage health-related needs will improve Outcome: Completed/Met   Problem: Clinical Measurements: Goal: Ability to maintain clinical measurements within normal limits will improve Outcome: Completed/Met Goal: Will remain free from infection Outcome: Completed/Met Goal: Diagnostic test results will improve Outcome: Completed/Met Goal: Respiratory complications will improve Outcome: Completed/Met Goal: Cardiovascular complication will be avoided Outcome: Completed/Met   Problem: Activity: Goal: Risk for activity intolerance will decrease Outcome: Completed/Met   Problem: Coping: Goal: Level of anxiety will decrease Outcome: Completed/Met   Problem: Elimination: Goal: Will not experience complications related to bowel motility Outcome: Completed/Met Goal: Will not experience complications related to urinary retention Outcome: Completed/Met   Problem: Pain Managment: Goal: General experience of comfort will improve Outcome: Completed/Met   Problem: Safety: Goal: Ability to remain free from injury will improve Outcome: Completed/Met   Problem: Skin Integrity: Goal: Risk for impaired skin integrity will decrease Outcome: Completed/Met   

## 2023-08-13 ENCOUNTER — Encounter: Payer: Self-pay | Admitting: Internal Medicine

## 2023-08-21 ENCOUNTER — Encounter: Payer: Self-pay | Admitting: Internal Medicine

## 2023-08-21 NOTE — Telephone Encounter (Signed)
Called spoke with pt daughter in law Okey Regal to f/u.  Reports pt is still in facility [facility].  Reports pt has an OV with Dunn, PA at our office on 8/27 asked if pt can wait until then.  Advised as long as pt is not symptomatic with swelling, SOB or wt gain should be okay.  Reports she does not visit often d/t back issues.  Advised I will call facility for information. Called facility spoke with pt nurse Judeth Cornfield.  She reports pt is having some SOB does not know if pt has gained wt but will place on list for daily wt.  Asked if facility can draw a BMP and BNP and have results faxed to our office.  She reports will place orders and I do not need to fax an order to [facility].  Will fax results to our office.

## 2023-08-25 NOTE — Progress Notes (Unsigned)
Cardiology Office Note    Date:  08/26/2023  ID:  Douglas Walker, DOB 05-09-1925, MRN [id number] PCP:  Joya Martyr, MD  Cardiologist:  Christell Constant, MD  Electrophysiologist:  Lewayne Bunting, MD   Chief Complaint: f/u fluid retention  History of Present Illness: .    Douglas Walker is a 89 y.o. male with visit-pertinent history of CKD 3b, anemia/thrombocytopenia by labs, persistent atrial fibrillation, chronic diastolic CHF, pulmonary HTN, CKD 3a, sinus node dysfunction s/p PPM, 5.1cm thoracic aortic aneurysm by CT 07/2022, mild AI, coronary/aortic atherosclerosis, arthritis, chronic back pain, essential tremor, ?CAD, GERD, hiatal hernia, HLD, HTN, macular degeneration, stroke, dementia who presents for follow-up.   He has known history of atrial fibrillation and recurrent syncope. He was ultimately found to have tachy-brady syndrome. In 2019 he underwent [employer] PPM followed by Dr. Ladona Ridgel. CAD is listed under PMH but no further details available. No cath or nuc on file. Over the last several years he's had several admissions for various issues - 06/2021 with complicated UTI, 09/2021 with severe sepsis/bacteremia in setting of CAUTI/pyelonephritis with chronic foley catheter (blood cx + Proteus), 07/2022 with severe right eye pain found to have choroidal hemorrhage (vision not salvagable) requiring evisceration, 09/2022 with recurrent sepsis with complicated UTI + AKI, 12/2022 with gross hematuria concerning for recrurent UTI/ESBL. He was most recently admitted 7/31-8/13/24 due to septic shock secondary to CAP/recurrent UTI, AKI on CKD 3a, encephalopathy. His midodrine and furosemide were discontinued on AVS. Last echo 2022 showed EF 50-55%, moderately elevated PASP, mod BAE, mild AI, mild aortic sclerosis without stenosis, dilated ascending aorta.  He is seen back for follow-up with his son Brett Canales. He has had a fair amount of decline. He required discharge to [facility] for  rehab because his son is unable to lift him with his own shoulder problems. Mr. Hansford is not eating and drinking as regularly as he was at home. They hope to get him back home in a few months. He has not had any chest pain. He had an episode of panting/shortness of breath a few days go. He also complains of some focal medial left knee discomfort that he relates to a long walk followed by a PT session. He has not had any falls or syncope.   Labwork independently reviewed: 07/2023 Hgb 9.8, plt 141, K 3.4, alb 2.6, AST ALT OK, Cr 1.29, Mg 2.1  06/2023 TSH OK 2022 LDL 126, trig 96  ROS: .    Please see the history of present illness.  All other systems are reviewed and otherwise negative.  Studies Reviewed: Marland Kitchen    EKG:  EKG is not ordered today but reviewed from 07/30/23 suggestive of AF with nonspecific STTW changes  CV Studies: Cardiac studies reviewed are outlined and summarized above. Otherwise please see EMR for full report.   Current Reported Medications:.    Current Meds  Medication Sig   acetaminophen (TYLENOL) 325 MG tablet Take 2 tablets (650 mg total) by mouth every 4 (four) hours as needed for mild pain (or temp > 37.5 C (99.5 F)).   apixaban (ELIQUIS) 2.5 MG TABS tablet Take 1 tablet (2.5 mg total) by mouth 2 (two) times daily.   Ascorbic Acid (VITAMIN C) 1000 MG tablet Take 1,000 mg by mouth daily.   DULoxetine (CYMBALTA) 60 MG capsule Take 1 capsule (60 mg total) by mouth daily.   leptospermum manuka honey (MEDIHONEY) PSTE paste Apply 1 Application topically daily. To hip wound  levothyroxine (SYNTHROID) 25 MCG tablet Take 1 tablet (25 mcg total) by mouth daily before breakfast.   liver oil-zinc oxide (DESITIN) 40 % ointment Apply topically 2 (two) times daily. To sacral region   loratadine (CLARITIN) 10 MG tablet Take 10 mg by mouth daily.   Menthol, Topical Analgesic, 4 % GEL Apply topically in the morning and at bedtime.   Multiple Vitamin (MULTIVITAMIN WITH MINERALS)  TABS tablet Take 1 tablet by mouth daily.   Multiple Vitamins-Minerals (OCUVITE ADULT 50+) CAPS Take 1 capsule by mouth daily.   Nutritional Supplements (BOOST GLUCOSE CONTROL) LIQD Take by mouth 3 (three) times daily. With every meal   nystatin ointment (MYCOSTATIN) Apply topically 2 (two) times daily. Apply to rash over R face   polyethylene glycol (MIRALAX / GLYCOLAX) 17 g packet Take 17 g by mouth daily.   pregabalin (LYRICA) 50 MG capsule Take 50 mg by mouth at bedtime.   traMADol (ULTRAM) 50 MG tablet Take 1 tablet (50 mg total) by mouth 2 (two) times daily.   vitamin B-12 (CYANOCOBALAMIN) 1000 MCG tablet Take 1 tablet (1,000 mcg total) by mouth every other day.    Physical Exam:    VS:  BP (!) 142/64   Pulse 71   Ht 5\' 8"  (1.727 m)   Wt 185 lb (83.9 kg)   SpO2 94%   BMI 28.13 kg/m    Wt Readings from Last 3 Encounters:  08/26/23 185 lb (83.9 kg)  08/12/23 194 lb 10.7 oz (88.3 kg)  07/29/23 184 lb (83.5 kg)    GEN: Frail elderly WM in no acute distress essential tremor of mouth NECK: No JVD; No carotid bruits CARDIAC: RRR, no murmurs, rubs, gallops RESPIRATORY:  Clear to auscultation without rales, wheezing or rhonchi  ABDOMEN: Soft, non-tender, non-distended EXTREMITIES:  No edema; No acute deformity, senile purpura noted   Asessement and Plan:.    1. Chronic HFpEF complicated by CKD 3a - he was taken off diuretic in the hospital in setting of AKI and multiple medical issues. His volume status appears stable today. He's had a narrow therapeutic index in the past. We'll check bloodwork today to help guide recommendations for potential resumption, possibly PRN or few times a week.  2. Pulmonary HTN - no invasive workup planned given age and frailty. Favor focus on symptom management. Await labs as above.  3. Persistent atrial fibrillation with tachy-brady syndrome - he has been in and out of AF in recent years, frequently in AF at clinic visits. Would continue with strategy  of rate control. He is not requiring any AVN blocking agents. He remains on low dose Eliquis 2.5mg  BID. For now, no specific contraindications to anticoagulation have been identified by outside teams but continue to re-evaluate at each visit. I told his son Brett Canales if he had any falls I would definitively discontinue Eliquis. Fortunately he has not had any falls.  4. Ascending TAA - not a candidate for surgery therefore no surveillance recommended.  5. Aortic/coronary atherosclerosis - not on ASA with concomitant Eliquis. No recent angina. Would defer statin therapy in the setting of frailty and age.    Disposition: F/u with Dr. Ladona Ridgel with EP as scheduled 09/2023, and gen cards (me or Dr. Izora Ribas in 3-4 months). He notes some focal L knee pain. No acute abnormality identified on exam. Recommend f/u with primary care team at [facility]. Son will discuss with them tomorrow.  Signed, Laurann Montana, PA-C

## 2023-08-26 ENCOUNTER — Encounter: Payer: Self-pay | Admitting: Physician Assistant

## 2023-08-26 ENCOUNTER — Ambulatory Visit: Payer: Medicare Other | Attending: Physician Assistant | Admitting: Physician Assistant

## 2023-08-26 VITALS — BP 142/64 | HR 71 | Ht 68.0 in | Wt 185.0 lb

## 2023-08-26 DIAGNOSIS — I272 Pulmonary hypertension, unspecified: Secondary | ICD-10-CM | POA: Diagnosis not present

## 2023-08-26 DIAGNOSIS — I2510 Atherosclerotic heart disease of native coronary artery without angina pectoris: Secondary | ICD-10-CM

## 2023-08-26 DIAGNOSIS — I4819 Other persistent atrial fibrillation: Secondary | ICD-10-CM

## 2023-08-26 DIAGNOSIS — I5032 Chronic diastolic (congestive) heart failure: Secondary | ICD-10-CM | POA: Diagnosis not present

## 2023-08-26 DIAGNOSIS — I7121 Aneurysm of the ascending aorta, without rupture: Secondary | ICD-10-CM | POA: Diagnosis not present

## 2023-08-26 DIAGNOSIS — I2584 Coronary atherosclerosis due to calcified coronary lesion: Secondary | ICD-10-CM

## 2023-08-26 NOTE — Patient Instructions (Addendum)
Medication Instructions:  Your physician recommends that you continue on your current medications as directed. Please refer to the Current Medication list given to you today.  *If you need a refill on your cardiac medications before your next appointment, please call your pharmacy*  Lab Work: Your physician recommends that you have lab work CMET, CBC, BNP  If you have labs (blood work) drawn today and your tests are completely normal, you will receive your results only by: MyChart Message (if you have MyChart) OR A paper copy in the mail If you have any lab test that is abnormal or we need to change your treatment, we will call you to review the results.  Testing/Procedures: None ordered at this time.  Follow-Up: At [facility], you and your health needs are our priority.  As part of our continuing mission to provide you with exceptional heart care, we have created designated Provider Care Teams.  These Care Teams include your primary Cardiologist (physician) and Advanced Practice Providers (APPs -  Physician Assistants and Nurse Practitioners) who all work together to provide you with the care you need, when you need it.  We recommend signing up for the patient portal called "MyChart".  Sign up information is provided on this After Visit Summary.  MyChart is used to connect with patients for Virtual Visits (Telemedicine).  Patients are able to view lab/test results, encounter notes, upcoming appointments, etc.  Non-urgent messages can be sent to your provider as well.   To learn more about what you can do with MyChart, go to [email or web address].    Your next appointment:   3 to 4 months   Provider:   Christell Constant, MD   or   Ronie Spies PA

## 2023-08-27 ENCOUNTER — Other Ambulatory Visit: Payer: Self-pay

## 2023-08-27 ENCOUNTER — Telehealth: Payer: Self-pay

## 2023-08-27 DIAGNOSIS — N1831 Chronic kidney disease, stage 3a: Secondary | ICD-10-CM

## 2023-08-27 DIAGNOSIS — R7989 Other specified abnormal findings of blood chemistry: Secondary | ICD-10-CM

## 2023-08-27 DIAGNOSIS — Z79899 Other long term (current) drug therapy: Secondary | ICD-10-CM

## 2023-08-27 DIAGNOSIS — I5032 Chronic diastolic (congestive) heart failure: Secondary | ICD-10-CM

## 2023-08-27 LAB — CBC WITH DIFFERENTIAL/PLATELET
Basophils Absolute: 0 10*3/uL (ref 0.0–0.2)
Basos: 1 %
EOS (ABSOLUTE): 0.1 10*3/uL (ref 0.0–0.4)
Eos: 2 %
Hematocrit: 33 % — ABNORMAL LOW (ref 37.5–51.0)
Hemoglobin: 10.6 g/dL — ABNORMAL LOW (ref 13.0–17.7)
Immature Grans (Abs): 0 10*3/uL (ref 0.0–0.1)
Immature Granulocytes: 0 %
Lymphocytes Absolute: 1.5 10*3/uL (ref 0.7–3.1)
Lymphs: 25 %
MCH: 29.9 pg (ref 26.6–33.0)
MCHC: 32.1 g/dL (ref 31.5–35.7)
MCV: 93 fL (ref 79–97)
Monocytes Absolute: 0.8 10*3/uL (ref 0.1–0.9)
Monocytes: 13 %
Neutrophils Absolute: 3.6 10*3/uL (ref 1.4–7.0)
Neutrophils: 59 %
Platelets: 105 10*3/uL — ABNORMAL LOW (ref 150–450)
RBC: 3.55 x10E6/uL — ABNORMAL LOW (ref 4.14–5.80)
RDW: 14.9 % (ref 11.6–15.4)
WBC: 6.1 10*3/uL (ref 3.4–10.8)

## 2023-08-27 LAB — COMPREHENSIVE METABOLIC PANEL
ALT: 16 IU/L (ref 0–44)
AST: 17 IU/L (ref 0–40)
Albumin: 3.7 g/dL (ref 3.6–4.6)
Alkaline Phosphatase: 112 IU/L (ref 44–121)
BUN/Creatinine Ratio: 16 (ref 10–24)
BUN: 20 mg/dL (ref 10–36)
Bilirubin Total: 0.4 mg/dL (ref 0.0–1.2)
CO2: 27 mmol/L (ref 20–29)
Calcium: 9.2 mg/dL (ref 8.6–10.2)
Chloride: 104 mmol/L (ref 96–106)
Creatinine, Ser: 1.28 mg/dL — ABNORMAL HIGH (ref 0.76–1.27)
Globulin, Total: 3.1 g/dL (ref 1.5–4.5)
Glucose: 99 mg/dL (ref 70–99)
Potassium: 4.7 mmol/L (ref 3.5–5.2)
Sodium: 141 mmol/L (ref 134–144)
Total Protein: 6.8 g/dL (ref 6.0–8.5)
eGFR: 51 mL/min/{1.73_m2} — ABNORMAL LOW (ref >59)

## 2023-08-27 LAB — PRO B NATRIURETIC PEPTIDE: NT-Pro BNP: 1981 pg/mL — ABNORMAL HIGH (ref 0–486)

## 2023-08-27 MED ORDER — FUROSEMIDE 20 MG PO TABS
20.0000 mg | ORAL_TABLET | Freq: Every day | ORAL | 3 refills | Status: DC
Start: 2023-08-27 — End: 2023-08-27

## 2023-08-27 MED ORDER — FUROSEMIDE 20 MG PO TABS
20.0000 mg | ORAL_TABLET | ORAL | 3 refills | Status: DC
Start: 2023-08-27 — End: 2023-09-15

## 2023-08-27 NOTE — Telephone Encounter (Signed)
The patient/ Douglas Walker on DPR  has been notified of the result and verbalized understanding.  All questions (if any) were answered. Lawson Fiscal with [facility] the nursing supervisor [phone]... asked to have the labs, orders, and notes faxed to her at [phone].

## 2023-08-27 NOTE — Telephone Encounter (Signed)
-----   Message from Laurann Montana sent at 08/27/2023  8:25 AM EDT ----- Patient's son Brett Canales assists with care and he lives at [facility]. Please let them know that BNP remains elevated, suggestive of some fluid buildup, but kidney function is abnormal which can affect this level. The kidney function appears stable/improved from prior. It might actually be falsely lower than before because he's been off the Lasix. I would propose we restart Lasix in the form of 20mg  3 times a week with recheck BMET in 1-2 weeks. Regarding his Eliquis dosing, he continues to have some degree of anemia and low platelet count. He has been on the 2.5mg  dose since end of last year. He #technically# qualifies for the 5mg  BID dose presently, but his renal function historically is worse than this. Given his age, frailty, anemia and thrombocytopenia, I would favor continuing the 2.5mg  BID dose for now as he is borderline as it is. I will cc to Dr Izora Ribas to ensure he agrees. - Keep f/u 09/2023 with Dr. Ladona Ridgel and 11/2023 with Dr. Izora Ribas.

## 2023-08-27 NOTE — Addendum Note (Signed)
Addended by: Bertram Millard on: 08/27/2023 02:32 PM   Modules accepted: Orders

## 2023-09-04 NOTE — Telephone Encounter (Signed)
Pt had labs drawn at 08/26/23 OV. Never received results from facility.

## 2023-09-09 LAB — LAB REPORT - SCANNED: EGFR: 48

## 2023-09-15 ENCOUNTER — Ambulatory Visit (INDEPENDENT_AMBULATORY_CARE_PROVIDER_SITE_OTHER): Payer: Medicare Other | Admitting: Podiatry

## 2023-09-15 ENCOUNTER — Telehealth: Payer: Self-pay | Admitting: *Deleted

## 2023-09-15 ENCOUNTER — Encounter: Payer: Self-pay | Admitting: Podiatry

## 2023-09-15 DIAGNOSIS — E1142 Type 2 diabetes mellitus with diabetic polyneuropathy: Secondary | ICD-10-CM

## 2023-09-15 DIAGNOSIS — I5032 Chronic diastolic (congestive) heart failure: Secondary | ICD-10-CM

## 2023-09-15 DIAGNOSIS — M79676 Pain in unspecified toe(s): Secondary | ICD-10-CM

## 2023-09-15 DIAGNOSIS — N1831 Chronic kidney disease, stage 3a: Secondary | ICD-10-CM

## 2023-09-15 DIAGNOSIS — Z79899 Other long term (current) drug therapy: Secondary | ICD-10-CM

## 2023-09-15 DIAGNOSIS — B351 Tinea unguium: Secondary | ICD-10-CM

## 2023-09-15 DIAGNOSIS — R7989 Other specified abnormal findings of blood chemistry: Secondary | ICD-10-CM

## 2023-09-15 MED ORDER — FUROSEMIDE 20 MG PO TABS: 20.0000 mg | ORAL_TABLET | ORAL | 3 refills | Status: DC

## 2023-09-15 NOTE — Telephone Encounter (Signed)
Loa Socks, LPN 03/06/6577  4:69 AM EDT Back to Top    Called the pts Son Douglas Walker  (on [place] and pt is staying with him since discharged from [facility]) and informed him that Douglas Spies PA-C reviewed the pts labs on 9/10 from [facility] and overall they looked stable and she advised for him to continue his current regimen/follow-up as planned.  Dayna Dunn PA-C advised him on 8/28 of lasix 20 mg po three times weekly and have the repeat labs with [facility] on 9/10, which is what these results are from (also copied in this result note).   Endorsed all this to the pts Son.   Pts son states he will need a new rx of lasix 20 mg 3x weekly sent to the pts pharmacy [facility] on [address] to be sent in, for they only have 40 mg tablets on hand at this time.   Will send in a refill of lasix 20 mg 3 times a week to the pts confirmed pharmacy of choice.   Son is aware to have the pt follow-up as planned with our office.   Son verbalized understanding and agrees with this plan.   Loa Socks, LPN 06/28/5283  1:32 AM EDT     Message from Laurann Montana sent at 08/27/2023  8:25 AM EDT ----- Patient's son Douglas Walker assists with care and he lives at [facility]. Please let them know that BNP remains elevated, suggestive of some fluid buildup, but kidney function is abnormal which can affect this level. The kidney function appears stable/improved from prior. It might actually be falsely lower than before because he's been off the Lasix. I would propose we restart Lasix in the form of 20mg  3 times a week with recheck BMET in 1-2 weeks. Regarding his Eliquis dosing, he continues to have some degree of anemia and low platelet count. He has been on the 2.5mg  dose since end of last year. He #technically# qualifies for the 5mg  BID dose presently, but his renal function historically is worse than this. Given his age, frailty, anemia and thrombocytopenia, I would favor continuing the 2.5mg  BID dose for now as he is  borderline as it is. I will cc to Dr Izora Ribas to ensure he agrees. - Keep f/u 09/2023 with Dr. Ladona Ridgel and 11/2023 with Dr. Izora Ribas.

## 2023-09-15 NOTE — Progress Notes (Signed)
This patient returns to my office for at risk foot care.  This patient requires this care by a professional since this patient will be at risk due to having peripheral neuropathy, CKD, Coagulation defect and thrombocytopenia.  This patient is unable to cut nails himself since the patient cannot reach his nails.These nails are painful walking and wearing shoes.  This patient presents for at risk foot care today.  He presents to the office with male son.  He is taking eliquis.  General Appearance  Alert, conversant and in no acute stress.  Vascular  Dorsalis pedis and posterior tibial  pulses are not  palpable  bilaterally.  Capillary return is within normal limits  bilaterally. Cold feet. Bilaterally. Swelling feet/legs  Neurologic  Senn-Weinstein monofilament wire test within normal limits  bilaterally. Muscle power within normal limits bilaterally.  Nails Thick disfigured discolored nails with subungual debris  from hallux to fifth toes bilaterally. Pincer nails hallux  B/L. No evidence of bacterial infection or drainage bilaterally.  Orthopedic  No limitations of motion  feet .  No crepitus or effusions noted.  No bony pathology or digital deformities noted.  HAV  B/L.  Skin  normotropic skin with no porokeratosis noted bilaterally.  No signs of infections or ulcers noted.     Onychomycosis  Pain in right toes  Pain in left toes  Consent was obtained for treatment procedures.   Mechanical debridement of nails 1-5  bilaterally performed with a nail nipper.  Filed with dremel without incident. Patient says he is having  pain in toes due to toenails.  No evidence of redness swelling  to nails.    Return office visit  10 weeks                   Told patient to return for periodic foot care and evaluation due to potential at risk complications.   Helane Gunther DPM

## 2023-09-15 NOTE — Telephone Encounter (Signed)
-----   Message from Laurann Montana sent at 09/12/2023  5:12 PM EDT ----- Can let patient's son know that I reviewed labs from [facility] from 9/10 and they are overall stable. No change in plan.  (This part is documentation only: we are keeping apixaban at lower dose given age, bleeding risk, anemia, historically worse Cr per d/w Dr. Izora Ribas after last visit.)

## 2023-09-29 ENCOUNTER — Encounter: Payer: Self-pay | Admitting: Internal Medicine

## 2023-09-29 DIAGNOSIS — I5032 Chronic diastolic (congestive) heart failure: Secondary | ICD-10-CM

## 2023-09-29 DIAGNOSIS — Z79899 Other long term (current) drug therapy: Secondary | ICD-10-CM

## 2023-09-30 MED ORDER — FUROSEMIDE 20 MG PO TABS: 20.0000 mg | ORAL_TABLET | Freq: Every day | ORAL | 3 refills | Status: DC

## 2023-09-30 NOTE — Telephone Encounter (Signed)
Please let pt's son know we can increase Lasix to 20mg  daily for now as long as BP is running >120. Can write the rx as 20mg  daily, may take extra 1 tablet daily as needed for increased fluid retention. If this does not help, let us know. Recheck BMET 1 week. Move f/u up from December to October please. Thank you!

## 2023-10-02 ENCOUNTER — Ambulatory Visit: Payer: Medicare Other

## 2023-10-02 ENCOUNTER — Other Ambulatory Visit: Payer: Self-pay | Admitting: Physician Assistant

## 2023-10-02 ENCOUNTER — Ambulatory Visit: Payer: Medicare Other | Attending: Internal Medicine | Admitting: Internal Medicine

## 2023-10-02 ENCOUNTER — Encounter: Payer: Self-pay | Admitting: Internal Medicine

## 2023-10-02 VITALS — BP 116/58 | HR 68 | Ht 69.0 in | Wt 184.0 lb

## 2023-10-02 DIAGNOSIS — I441 Atrioventricular block, second degree: Secondary | ICD-10-CM

## 2023-10-02 DIAGNOSIS — I700 Atherosclerosis of aorta: Secondary | ICD-10-CM

## 2023-10-02 DIAGNOSIS — I495 Sick sinus syndrome: Secondary | ICD-10-CM

## 2023-10-02 DIAGNOSIS — I5032 Chronic diastolic (congestive) heart failure: Secondary | ICD-10-CM

## 2023-10-02 DIAGNOSIS — I2510 Atherosclerotic heart disease of native coronary artery without angina pectoris: Secondary | ICD-10-CM

## 2023-10-02 DIAGNOSIS — Z950 Presence of cardiac pacemaker: Secondary | ICD-10-CM

## 2023-10-02 MED ORDER — FUROSEMIDE 40 MG PO TABS: 40.0000 mg | ORAL_TABLET | Freq: Every day | ORAL | 3 refills | Status: DC

## 2023-10-02 NOTE — Progress Notes (Signed)
Cardiology Office Note:    Date:  10/02/2023   ID:  Douglas Walker, DOB Sep 14, 1925, MRN [id number]  PCP:  Georgann Housekeeper, MD   [facility] Providers Cardiologist:  Christell Constant, MD Electrophysiologist:  Lewayne Bunting, MD     Referring MD: Joya Martyr, MD   CC: DOD: SOB  History of Present Illness:    Douglas Walker is a 89 y.o. male with a hx of Extensive CAC with no prior CAD, Stable anemia and thromboctyopenia that has tolerated AC, Chronic HFpEF and PHT with hypotension that has required midodrine, TAA (51 mm) without plans for following due to age and comorbidity that precludes intervention, PAF and essential tremor tolerating DOAC, and prior history of stroke. 2024: Patient can no longer feel when he is or is not in a fib. Mild leg swelling. Noted that at home the patient notes he has lived a good life and does not want heroic measures.  Called in for worsening symptoms.  Patient notes that he is doing poorly.   Since last visit notes that he did not received the increased diuretics that we had ordered. Son and I are unclear of reason.  Son notes that his diuretics were decrease by others because of increase in creatinine.  Also notes that he has had worsening leg swelling, fatigue, and energy.  Son notes worsening essential tremor.  He has increased weight to 184-185 lbs.   Past Medical History:  Diagnosis Date   Anemia    Arthritis    shoulders and back   Cancer (HCC)    skin cancers   Chronic diastolic (congestive) heart failure (HCC)    Chronic low back pain 12/15/2017   CKD (chronic kidney disease) stage 3, GFR 30-59 ml/min (HCC)    Essential tremor 02/28/2016   GERD (gastroesophageal reflux disease)    Hernia    History of hiatal hernia    Hyperlipemia    Hypertension    Macular degeneration    Neuromuscular disorder (HCC)    neuropathy   Paroxysmal atrial fibrillation (HCC) 10/28/2013   Presence of permanent cardiac  pacemaker 12/04/2018   Pulmonary hypertension (HCC)    Stroke (HCC) 12/2020   Tachycardia-bradycardia syndrome (HCC) 09/13/2014   Thoracic aortic aneurysm (HCC)    Thrombocytopenia (HCC)    TIA (transient ischemic attack)     Past Surgical History:  Procedure Laterality Date   APPENDECTOMY     EYE SURGERY     HERNIA REPAIR     HERNIA REPAIR     INSERT / REPLACE / REMOVE PACEMAKER  12/04/2018   IRRIGATION AND DEBRIDEMENT ABSCESS Right 07/24/2020   Procedure: IRRIGATION AND DEBRIDEMENT HEMATOMA;  Surgeon: Abigail Miyamoto, MD;  Location: [facility] OR;  Service: General;  Laterality: Right;   MASS EXCISION Right 07/20/2020   Procedure: EXCISION OF RIGHT CHEST WALL MASS;  Surgeon: Almond Lint, MD;  Location: [facility] OR;  Service: General;  Laterality: Right;   PACEMAKER IMPLANT N/A 12/04/2018   Procedure: PACEMAKER IMPLANT;  Surgeon: Marinus Maw, MD;  Location: [facility] INVASIVE CV LAB;  Service: Cardiovascular;  Laterality: N/A;    Current Medications: Current Meds  Medication Sig   acetaminophen (TYLENOL) 325 MG tablet Take 2 tablets (650 mg total) by mouth every 4 (four) hours as needed for mild pain (or temp > 37.5 C (99.5 F)).   apixaban (ELIQUIS) 2.5 MG TABS tablet Take 1 tablet (2.5 mg total) by mouth 2 (two) times daily.   Ascorbic Acid (VITAMIN  Cardiology Office Note:    Date:  10/02/2023   ID:  Douglas Walker, DOB Sep 14, 1925, MRN [id number]  PCP:  Georgann Housekeeper, MD   [facility] Providers Cardiologist:  Christell Constant, MD Electrophysiologist:  Lewayne Bunting, MD     Referring MD: Joya Martyr, MD   CC: DOD: SOB  History of Present Illness:    Douglas Walker is a 89 y.o. male with a hx of Extensive CAC with no prior CAD, Stable anemia and thromboctyopenia that has tolerated AC, Chronic HFpEF and PHT with hypotension that has required midodrine, TAA (51 mm) without plans for following due to age and comorbidity that precludes intervention, PAF and essential tremor tolerating DOAC, and prior history of stroke. 2024: Patient can no longer feel when he is or is not in a fib. Mild leg swelling. Noted that at home the patient notes he has lived a good life and does not want heroic measures.  Called in for worsening symptoms.  Patient notes that he is doing poorly.   Since last visit notes that he did not received the increased diuretics that we had ordered. Son and I are unclear of reason.  Son notes that his diuretics were decrease by others because of increase in creatinine.  Also notes that he has had worsening leg swelling, fatigue, and energy.  Son notes worsening essential tremor.  He has increased weight to 184-185 lbs.   Past Medical History:  Diagnosis Date   Anemia    Arthritis    shoulders and back   Cancer (HCC)    skin cancers   Chronic diastolic (congestive) heart failure (HCC)    Chronic low back pain 12/15/2017   CKD (chronic kidney disease) stage 3, GFR 30-59 ml/min (HCC)    Essential tremor 02/28/2016   GERD (gastroesophageal reflux disease)    Hernia    History of hiatal hernia    Hyperlipemia    Hypertension    Macular degeneration    Neuromuscular disorder (HCC)    neuropathy   Paroxysmal atrial fibrillation (HCC) 10/28/2013   Presence of permanent cardiac  pacemaker 12/04/2018   Pulmonary hypertension (HCC)    Stroke (HCC) 12/2020   Tachycardia-bradycardia syndrome (HCC) 09/13/2014   Thoracic aortic aneurysm (HCC)    Thrombocytopenia (HCC)    TIA (transient ischemic attack)     Past Surgical History:  Procedure Laterality Date   APPENDECTOMY     EYE SURGERY     HERNIA REPAIR     HERNIA REPAIR     INSERT / REPLACE / REMOVE PACEMAKER  12/04/2018   IRRIGATION AND DEBRIDEMENT ABSCESS Right 07/24/2020   Procedure: IRRIGATION AND DEBRIDEMENT HEMATOMA;  Surgeon: Abigail Miyamoto, MD;  Location: [facility] OR;  Service: General;  Laterality: Right;   MASS EXCISION Right 07/20/2020   Procedure: EXCISION OF RIGHT CHEST WALL MASS;  Surgeon: Almond Lint, MD;  Location: [facility] OR;  Service: General;  Laterality: Right;   PACEMAKER IMPLANT N/A 12/04/2018   Procedure: PACEMAKER IMPLANT;  Surgeon: Marinus Maw, MD;  Location: [facility] INVASIVE CV LAB;  Service: Cardiovascular;  Laterality: N/A;    Current Medications: Current Meds  Medication Sig   acetaminophen (TYLENOL) 325 MG tablet Take 2 tablets (650 mg total) by mouth every 4 (four) hours as needed for mild pain (or temp > 37.5 C (99.5 F)).   apixaban (ELIQUIS) 2.5 MG TABS tablet Take 1 tablet (2.5 mg total) by mouth 2 (two) times daily.   Ascorbic Acid (VITAMIN  ROS:   Please see the history of present illness.     EKGs/Labs/Other Studies Reviewed:    The following studies were reviewed today:  Cardiac Studies & Procedures       ECHOCARDIOGRAM  ECHOCARDIOGRAM COMPLETE 01/28/2021  Narrative ECHOCARDIOGRAM REPORT    Patient Name:   Douglas Walker Date of Exam: 01/28/2021 Medical Rec #:  [id number]          Height:       70.5 in Accession #:    [id number]         Weight:       203.5 lb Date of Birth:  [birth year]          BSA:          2.114 m Patient Age:    89 years           BP:           176/81 mmHg Patient Gender: M                  HR:           70 bpm. Exam Location:  Inpatient  Procedure: 2D Echo  Indications:    TIA 435.9  History:        Patient has prior history of Echocardiogram examinations, most recent 05/19/2015. Chronic kidney disease, Arrythmias:Atrial Fibrillation; Risk Factors:Hypertension.  Sonographer:    Delcie Roch Referring Phys: [id number] PING T ZHANG   Sonographer Comments: Image acquisition challenging due to uncooperative patient. IMPRESSIONS   1. Left ventricular ejection fraction, by estimation, is 50 to 55%. The left ventricle has low normal function. The left ventricle has no regional wall motion abnormalities. Left ventricular diastolic function could not be evaluated. 2. Right  ventricular systolic function is mildly reduced. The right ventricular size is normal. There is moderately elevated pulmonary artery systolic pressure. 3. Left atrial size was moderately dilated. 4. Right atrial size was moderately dilated. 5. The mitral valve is normal in structure. No evidence of mitral valve regurgitation. 6. The aortic valve is tricuspid. Aortic valve regurgitation is mild. Mild aortic valve sclerosis is present, with no evidence of aortic valve stenosis. 7. Aortic dilatation noted. There is moderate dilatation of the ascending aorta, measuring 47 mm. 8. The inferior vena cava is dilated in size with <50% respiratory variability, suggesting right atrial pressure of 15 mmHg.  Comparison(s): Prior images unable to be directly viewed, comparison made by report only. Ascending aorta dilation has worsened since 2019 (but measurement is similar to CT angio report from may 2021).  FINDINGS Left Ventricle: Left ventricular ejection fraction, by estimation, is 50 to 55%. The left ventricle has low normal function. The left ventricle has no regional wall motion abnormalities. The left ventricular internal cavity size was normal in size. There is no left ventricular hypertrophy. Abnormal (paradoxical) septal motion, consistent with RV pacemaker. Left ventricular diastolic function could not be evaluated due to atrial fibrillation. Left ventricular diastolic function could not be evaluated.  Right Ventricle: The right ventricular size is normal. No increase in right ventricular wall thickness. Right ventricular systolic function is mildly reduced. There is moderately elevated pulmonary artery systolic pressure. The tricuspid regurgitant velocity is 2.76 m/s, and with an assumed right atrial pressure of 15 mmHg, the estimated right ventricular systolic pressure is 45.5 mmHg.  Left Atrium: Left atrial size was moderately dilated.  Right Atrium: Right atrial size was moderately  dilated.  Pericardium: There is no evidence of pericardial effusion.  Cardiology Office Note:    Date:  10/02/2023   ID:  Douglas Walker, DOB Sep 14, 1925, MRN [id number]  PCP:  Georgann Housekeeper, MD   [facility] Providers Cardiologist:  Christell Constant, MD Electrophysiologist:  Lewayne Bunting, MD     Referring MD: Joya Martyr, MD   CC: DOD: SOB  History of Present Illness:    Douglas Walker is a 89 y.o. male with a hx of Extensive CAC with no prior CAD, Stable anemia and thromboctyopenia that has tolerated AC, Chronic HFpEF and PHT with hypotension that has required midodrine, TAA (51 mm) without plans for following due to age and comorbidity that precludes intervention, PAF and essential tremor tolerating DOAC, and prior history of stroke. 2024: Patient can no longer feel when he is or is not in a fib. Mild leg swelling. Noted that at home the patient notes he has lived a good life and does not want heroic measures.  Called in for worsening symptoms.  Patient notes that he is doing poorly.   Since last visit notes that he did not received the increased diuretics that we had ordered. Son and I are unclear of reason.  Son notes that his diuretics were decrease by others because of increase in creatinine.  Also notes that he has had worsening leg swelling, fatigue, and energy.  Son notes worsening essential tremor.  He has increased weight to 184-185 lbs.   Past Medical History:  Diagnosis Date   Anemia    Arthritis    shoulders and back   Cancer (HCC)    skin cancers   Chronic diastolic (congestive) heart failure (HCC)    Chronic low back pain 12/15/2017   CKD (chronic kidney disease) stage 3, GFR 30-59 ml/min (HCC)    Essential tremor 02/28/2016   GERD (gastroesophageal reflux disease)    Hernia    History of hiatal hernia    Hyperlipemia    Hypertension    Macular degeneration    Neuromuscular disorder (HCC)    neuropathy   Paroxysmal atrial fibrillation (HCC) 10/28/2013   Presence of permanent cardiac  pacemaker 12/04/2018   Pulmonary hypertension (HCC)    Stroke (HCC) 12/2020   Tachycardia-bradycardia syndrome (HCC) 09/13/2014   Thoracic aortic aneurysm (HCC)    Thrombocytopenia (HCC)    TIA (transient ischemic attack)     Past Surgical History:  Procedure Laterality Date   APPENDECTOMY     EYE SURGERY     HERNIA REPAIR     HERNIA REPAIR     INSERT / REPLACE / REMOVE PACEMAKER  12/04/2018   IRRIGATION AND DEBRIDEMENT ABSCESS Right 07/24/2020   Procedure: IRRIGATION AND DEBRIDEMENT HEMATOMA;  Surgeon: Abigail Miyamoto, MD;  Location: [facility] OR;  Service: General;  Laterality: Right;   MASS EXCISION Right 07/20/2020   Procedure: EXCISION OF RIGHT CHEST WALL MASS;  Surgeon: Almond Lint, MD;  Location: [facility] OR;  Service: General;  Laterality: Right;   PACEMAKER IMPLANT N/A 12/04/2018   Procedure: PACEMAKER IMPLANT;  Surgeon: Marinus Maw, MD;  Location: [facility] INVASIVE CV LAB;  Service: Cardiovascular;  Laterality: N/A;    Current Medications: Current Meds  Medication Sig   acetaminophen (TYLENOL) 325 MG tablet Take 2 tablets (650 mg total) by mouth every 4 (four) hours as needed for mild pain (or temp > 37.5 C (99.5 F)).   apixaban (ELIQUIS) 2.5 MG TABS tablet Take 1 tablet (2.5 mg total) by mouth 2 (two) times daily.   Ascorbic Acid (VITAMIN  ROS:   Please see the history of present illness.     EKGs/Labs/Other Studies Reviewed:    The following studies were reviewed today:  Cardiac Studies & Procedures       ECHOCARDIOGRAM  ECHOCARDIOGRAM COMPLETE 01/28/2021  Narrative ECHOCARDIOGRAM REPORT    Patient Name:   Douglas Walker Date of Exam: 01/28/2021 Medical Rec #:  [id number]          Height:       70.5 in Accession #:    [id number]         Weight:       203.5 lb Date of Birth:  [birth year]          BSA:          2.114 m Patient Age:    89 years           BP:           176/81 mmHg Patient Gender: M                  HR:           70 bpm. Exam Location:  Inpatient  Procedure: 2D Echo  Indications:    TIA 435.9  History:        Patient has prior history of Echocardiogram examinations, most recent 05/19/2015. Chronic kidney disease, Arrythmias:Atrial Fibrillation; Risk Factors:Hypertension.  Sonographer:    Delcie Roch Referring Phys: [id number] PING T ZHANG   Sonographer Comments: Image acquisition challenging due to uncooperative patient. IMPRESSIONS   1. Left ventricular ejection fraction, by estimation, is 50 to 55%. The left ventricle has low normal function. The left ventricle has no regional wall motion abnormalities. Left ventricular diastolic function could not be evaluated. 2. Right  ventricular systolic function is mildly reduced. The right ventricular size is normal. There is moderately elevated pulmonary artery systolic pressure. 3. Left atrial size was moderately dilated. 4. Right atrial size was moderately dilated. 5. The mitral valve is normal in structure. No evidence of mitral valve regurgitation. 6. The aortic valve is tricuspid. Aortic valve regurgitation is mild. Mild aortic valve sclerosis is present, with no evidence of aortic valve stenosis. 7. Aortic dilatation noted. There is moderate dilatation of the ascending aorta, measuring 47 mm. 8. The inferior vena cava is dilated in size with <50% respiratory variability, suggesting right atrial pressure of 15 mmHg.  Comparison(s): Prior images unable to be directly viewed, comparison made by report only. Ascending aorta dilation has worsened since 2019 (but measurement is similar to CT angio report from may 2021).  FINDINGS Left Ventricle: Left ventricular ejection fraction, by estimation, is 50 to 55%. The left ventricle has low normal function. The left ventricle has no regional wall motion abnormalities. The left ventricular internal cavity size was normal in size. There is no left ventricular hypertrophy. Abnormal (paradoxical) septal motion, consistent with RV pacemaker. Left ventricular diastolic function could not be evaluated due to atrial fibrillation. Left ventricular diastolic function could not be evaluated.  Right Ventricle: The right ventricular size is normal. No increase in right ventricular wall thickness. Right ventricular systolic function is mildly reduced. There is moderately elevated pulmonary artery systolic pressure. The tricuspid regurgitant velocity is 2.76 m/s, and with an assumed right atrial pressure of 15 mmHg, the estimated right ventricular systolic pressure is 45.5 mmHg.  Left Atrium: Left atrial size was moderately dilated.  Right Atrium: Right atrial size was moderately  dilated.  Pericardium: There is no evidence of pericardial effusion.  ROS:   Please see the history of present illness.     EKGs/Labs/Other Studies Reviewed:    The following studies were reviewed today:  Cardiac Studies & Procedures       ECHOCARDIOGRAM  ECHOCARDIOGRAM COMPLETE 01/28/2021  Narrative ECHOCARDIOGRAM REPORT    Patient Name:   Douglas Walker Date of Exam: 01/28/2021 Medical Rec #:  [id number]          Height:       70.5 in Accession #:    [id number]         Weight:       203.5 lb Date of Birth:  [birth year]          BSA:          2.114 m Patient Age:    89 years           BP:           176/81 mmHg Patient Gender: M                  HR:           70 bpm. Exam Location:  Inpatient  Procedure: 2D Echo  Indications:    TIA 435.9  History:        Patient has prior history of Echocardiogram examinations, most recent 05/19/2015. Chronic kidney disease, Arrythmias:Atrial Fibrillation; Risk Factors:Hypertension.  Sonographer:    Delcie Roch Referring Phys: [id number] PING T ZHANG   Sonographer Comments: Image acquisition challenging due to uncooperative patient. IMPRESSIONS   1. Left ventricular ejection fraction, by estimation, is 50 to 55%. The left ventricle has low normal function. The left ventricle has no regional wall motion abnormalities. Left ventricular diastolic function could not be evaluated. 2. Right  ventricular systolic function is mildly reduced. The right ventricular size is normal. There is moderately elevated pulmonary artery systolic pressure. 3. Left atrial size was moderately dilated. 4. Right atrial size was moderately dilated. 5. The mitral valve is normal in structure. No evidence of mitral valve regurgitation. 6. The aortic valve is tricuspid. Aortic valve regurgitation is mild. Mild aortic valve sclerosis is present, with no evidence of aortic valve stenosis. 7. Aortic dilatation noted. There is moderate dilatation of the ascending aorta, measuring 47 mm. 8. The inferior vena cava is dilated in size with <50% respiratory variability, suggesting right atrial pressure of 15 mmHg.  Comparison(s): Prior images unable to be directly viewed, comparison made by report only. Ascending aorta dilation has worsened since 2019 (but measurement is similar to CT angio report from may 2021).  FINDINGS Left Ventricle: Left ventricular ejection fraction, by estimation, is 50 to 55%. The left ventricle has low normal function. The left ventricle has no regional wall motion abnormalities. The left ventricular internal cavity size was normal in size. There is no left ventricular hypertrophy. Abnormal (paradoxical) septal motion, consistent with RV pacemaker. Left ventricular diastolic function could not be evaluated due to atrial fibrillation. Left ventricular diastolic function could not be evaluated.  Right Ventricle: The right ventricular size is normal. No increase in right ventricular wall thickness. Right ventricular systolic function is mildly reduced. There is moderately elevated pulmonary artery systolic pressure. The tricuspid regurgitant velocity is 2.76 m/s, and with an assumed right atrial pressure of 15 mmHg, the estimated right ventricular systolic pressure is 45.5 mmHg.  Left Atrium: Left atrial size was moderately dilated.  Right Atrium: Right atrial size was moderately  dilated.  Pericardium: There is no evidence of pericardial effusion.

## 2023-10-02 NOTE — Patient Instructions (Signed)
Medication Instructions:  Your physician has recommended you make the following change in your medication:  START: furosemide 40 mg by mouth once daily  *If you need a refill on your cardiac medications before your next appointment, please call your pharmacy*   Lab Work: IN 2 WEEKS: BMP, BNP  If you have labs (blood work) drawn today and your tests are completely normal, you will receive your results only by: MyChart Message (if you have MyChart) OR A paper copy in the mail If you have any lab test that is abnormal or we need to change your treatment, we will call you to review the results.   Testing/Procedures: NONE   Follow-Up: At [facility] you and your health needs are our priority.  As part of our continuing mission to provide you with exceptional heart care, we have created designated Provider Care Teams.  These Care Teams include your primary Cardiologist (physician) and Advanced Practice Providers (APPs -  Physician Assistants and Nurse Practitioners) who all work together to provide you with the care you need, when you need it.   Your next appointment:   2 month(s)  Provider:   Christell Constant, MD  or Ronie Spies, PA-C

## 2023-10-03 ENCOUNTER — Other Ambulatory Visit: Payer: Self-pay

## 2023-10-03 ENCOUNTER — Inpatient Hospital Stay (HOSPITAL_COMMUNITY)
Admission: EM | Admit: 2023-10-03 | Discharge: 2023-10-06 | DRG: 177 | Disposition: A | Discharge status: Home Health | Payer: Medicare Other | Attending: Internal Medicine | Admitting: Internal Medicine

## 2023-10-03 ENCOUNTER — Emergency Department (HOSPITAL_COMMUNITY): Payer: Medicare Other

## 2023-10-03 ENCOUNTER — Encounter (HOSPITAL_COMMUNITY): Payer: Self-pay | Admitting: *Deleted

## 2023-10-03 DIAGNOSIS — Z87891 Personal history of nicotine dependence: Secondary | ICD-10-CM

## 2023-10-03 DIAGNOSIS — E785 Hyperlipidemia, unspecified: Secondary | ICD-10-CM | POA: Diagnosis present

## 2023-10-03 DIAGNOSIS — B964 Proteus (mirabilis) (morganii) as the cause of diseases classified elsewhere: Secondary | ICD-10-CM | POA: Diagnosis present

## 2023-10-03 DIAGNOSIS — I5032 Chronic diastolic (congestive) heart failure: Secondary | ICD-10-CM

## 2023-10-03 DIAGNOSIS — F0390 Unspecified dementia without behavioral disturbance: Secondary | ICD-10-CM | POA: Diagnosis present

## 2023-10-03 DIAGNOSIS — J690 Pneumonitis due to inhalation of food and vomit: Secondary | ICD-10-CM | POA: Diagnosis present

## 2023-10-03 DIAGNOSIS — E871 Hypo-osmolality and hyponatremia: Secondary | ICD-10-CM | POA: Diagnosis not present

## 2023-10-03 DIAGNOSIS — J189 Pneumonia, unspecified organism: Secondary | ICD-10-CM | POA: Diagnosis not present

## 2023-10-03 DIAGNOSIS — Z8673 Personal history of transient ischemic attack (TIA), and cerebral infarction without residual deficits: Secondary | ICD-10-CM

## 2023-10-03 DIAGNOSIS — Z66 Do not resuscitate: Secondary | ICD-10-CM | POA: Diagnosis present

## 2023-10-03 DIAGNOSIS — R1310 Dysphagia, unspecified: Secondary | ICD-10-CM | POA: Diagnosis present

## 2023-10-03 DIAGNOSIS — Z1152 Encounter for screening for COVID-19: Secondary | ICD-10-CM

## 2023-10-03 DIAGNOSIS — G250 Essential tremor: Secondary | ICD-10-CM | POA: Diagnosis present

## 2023-10-03 DIAGNOSIS — R0609 Other forms of dyspnea: Secondary | ICD-10-CM | POA: Diagnosis not present

## 2023-10-03 DIAGNOSIS — I130 Hypertensive heart and chronic kidney disease with heart failure and stage 1 through stage 4 chronic kidney disease, or unspecified chronic kidney disease: Secondary | ICD-10-CM | POA: Diagnosis present

## 2023-10-03 DIAGNOSIS — D631 Anemia in chronic kidney disease: Secondary | ICD-10-CM | POA: Diagnosis present

## 2023-10-03 DIAGNOSIS — Z882 Allergy status to sulfonamides status: Secondary | ICD-10-CM

## 2023-10-03 DIAGNOSIS — I480 Paroxysmal atrial fibrillation: Secondary | ICD-10-CM | POA: Diagnosis present

## 2023-10-03 DIAGNOSIS — Z8701 Personal history of pneumonia (recurrent): Secondary | ICD-10-CM

## 2023-10-03 DIAGNOSIS — G629 Polyneuropathy, unspecified: Secondary | ICD-10-CM | POA: Diagnosis present

## 2023-10-03 DIAGNOSIS — I77810 Thoracic aortic ectasia: Secondary | ICD-10-CM | POA: Diagnosis present

## 2023-10-03 DIAGNOSIS — G9341 Metabolic encephalopathy: Secondary | ICD-10-CM | POA: Diagnosis present

## 2023-10-03 DIAGNOSIS — I5033 Acute on chronic diastolic (congestive) heart failure: Secondary | ICD-10-CM | POA: Diagnosis present

## 2023-10-03 DIAGNOSIS — Z821 Family history of blindness and visual loss: Secondary | ICD-10-CM

## 2023-10-03 DIAGNOSIS — F03B0 Unspecified dementia, moderate, without behavioral disturbance, psychotic disturbance, mood disturbance, and anxiety: Secondary | ICD-10-CM | POA: Diagnosis not present

## 2023-10-03 DIAGNOSIS — Z79899 Other long term (current) drug therapy: Secondary | ICD-10-CM

## 2023-10-03 DIAGNOSIS — Z79890 Hormone replacement therapy (postmenopausal): Secondary | ICD-10-CM

## 2023-10-03 DIAGNOSIS — L89890 Pressure ulcer of other site, unstageable: Secondary | ICD-10-CM | POA: Diagnosis present

## 2023-10-03 DIAGNOSIS — Z950 Presence of cardiac pacemaker: Secondary | ICD-10-CM

## 2023-10-03 DIAGNOSIS — Z960 Presence of urogenital implants: Secondary | ICD-10-CM | POA: Diagnosis present

## 2023-10-03 DIAGNOSIS — J9601 Acute respiratory failure with hypoxia: Principal | ICD-10-CM

## 2023-10-03 DIAGNOSIS — I495 Sick sinus syndrome: Secondary | ICD-10-CM | POA: Diagnosis present

## 2023-10-03 DIAGNOSIS — N179 Acute kidney failure, unspecified: Secondary | ICD-10-CM | POA: Diagnosis present

## 2023-10-03 DIAGNOSIS — I10 Essential (primary) hypertension: Secondary | ICD-10-CM | POA: Diagnosis present

## 2023-10-03 DIAGNOSIS — G8929 Other chronic pain: Secondary | ICD-10-CM | POA: Diagnosis present

## 2023-10-03 DIAGNOSIS — N1831 Chronic kidney disease, stage 3a: Secondary | ICD-10-CM | POA: Diagnosis present

## 2023-10-03 DIAGNOSIS — A419 Sepsis, unspecified organism: Secondary | ICD-10-CM

## 2023-10-03 DIAGNOSIS — N183 Chronic kidney disease, stage 3 unspecified: Secondary | ICD-10-CM | POA: Diagnosis present

## 2023-10-03 DIAGNOSIS — D649 Anemia, unspecified: Secondary | ICD-10-CM | POA: Diagnosis present

## 2023-10-03 DIAGNOSIS — Z85828 Personal history of other malignant neoplasm of skin: Secondary | ICD-10-CM

## 2023-10-03 DIAGNOSIS — N400 Enlarged prostate without lower urinary tract symptoms: Secondary | ICD-10-CM | POA: Diagnosis present

## 2023-10-03 DIAGNOSIS — Z7901 Long term (current) use of anticoagulants: Secondary | ICD-10-CM

## 2023-10-03 DIAGNOSIS — R21 Rash and other nonspecific skin eruption: Secondary | ICD-10-CM | POA: Diagnosis present

## 2023-10-03 DIAGNOSIS — M159 Polyosteoarthritis, unspecified: Secondary | ICD-10-CM | POA: Diagnosis present

## 2023-10-03 DIAGNOSIS — Z888 Allergy status to other drugs, medicaments and biological substances status: Secondary | ICD-10-CM

## 2023-10-03 DIAGNOSIS — N390 Urinary tract infection, site not specified: Secondary | ICD-10-CM | POA: Diagnosis present

## 2023-10-03 DIAGNOSIS — L8990 Pressure ulcer of unspecified site, unspecified stage: Secondary | ICD-10-CM

## 2023-10-03 DIAGNOSIS — Z978 Presence of other specified devices: Secondary | ICD-10-CM

## 2023-10-03 DIAGNOSIS — K219 Gastro-esophageal reflux disease without esophagitis: Secondary | ICD-10-CM | POA: Diagnosis present

## 2023-10-03 DIAGNOSIS — E039 Hypothyroidism, unspecified: Secondary | ICD-10-CM | POA: Diagnosis present

## 2023-10-03 DIAGNOSIS — D696 Thrombocytopenia, unspecified: Secondary | ICD-10-CM | POA: Diagnosis present

## 2023-10-03 DIAGNOSIS — I272 Pulmonary hypertension, unspecified: Secondary | ICD-10-CM | POA: Diagnosis present

## 2023-10-03 LAB — URINALYSIS, W/ REFLEX TO CULTURE (INFECTION SUSPECTED)
Bilirubin Urine: NEGATIVE
Glucose, UA: NEGATIVE mg/dL
Ketones, ur: NEGATIVE mg/dL
Nitrite: NEGATIVE
Protein, ur: NEGATIVE mg/dL
Specific Gravity, Urine: 1.009 (ref 1.005–1.030)
WBC, UA: >50 WBC/hpf (ref 0–5)
pH: 7 (ref 5.0–8.0)

## 2023-10-03 LAB — BASIC METABOLIC PANEL
BUN/Creatinine Ratio: 15 (ref 10–24)
BUN: 24 mg/dL (ref 10–36)
CO2: 25 mmol/L (ref 20–29)
Calcium: 9.2 mg/dL (ref 8.6–10.2)
Chloride: 96 mmol/L (ref 96–106)
Creatinine, Ser: 1.61 mg/dL — ABNORMAL HIGH (ref 0.76–1.27)
Glucose: 84 mg/dL (ref 70–99)
Potassium: 4.6 mmol/L (ref 3.5–5.2)
Sodium: 138 mmol/L (ref 134–144)
eGFR: 39 mL/min/{1.73_m2} — ABNORMAL LOW (ref >59)

## 2023-10-03 LAB — CBC WITH DIFFERENTIAL/PLATELET
Abs Immature Granulocytes: 0.11 10*3/uL — ABNORMAL HIGH (ref 0.00–0.07)
Basophils Absolute: 0 10*3/uL (ref 0.0–0.1)
Basophils Relative: 0 %
Eosinophils Absolute: 0 10*3/uL (ref 0.0–0.5)
Eosinophils Relative: 0 %
HCT: 29 % — ABNORMAL LOW (ref 39.0–52.0)
Hemoglobin: 9.6 g/dL — ABNORMAL LOW (ref 13.0–17.0)
Immature Granulocytes: 1 %
Lymphocytes Relative: 8 %
Lymphs Abs: 0.9 10*3/uL (ref 0.7–4.0)
MCH: 30.1 pg (ref 26.0–34.0)
MCHC: 33.1 g/dL (ref 30.0–36.0)
MCV: 90.9 fL (ref 80.0–100.0)
Monocytes Absolute: 1.5 10*3/uL — ABNORMAL HIGH (ref 0.1–1.0)
Monocytes Relative: 13 %
Neutro Abs: 8.4 10*3/uL — ABNORMAL HIGH (ref 1.7–7.7)
Neutrophils Relative %: 78 %
Platelets: 106 10*3/uL — ABNORMAL LOW (ref 150–400)
RBC: 3.19 MIL/uL — ABNORMAL LOW (ref 4.22–5.81)
RDW: 16.7 % — ABNORMAL HIGH (ref 11.5–15.5)
WBC: 10.9 10*3/uL — ABNORMAL HIGH (ref 4.0–10.5)
nRBC: 0 % (ref 0.0–0.2)

## 2023-10-03 LAB — COMPREHENSIVE METABOLIC PANEL
ALT: 26 U/L (ref 0–44)
AST: 30 U/L (ref 15–41)
Albumin: 2.9 g/dL — ABNORMAL LOW (ref 3.5–5.0)
Alkaline Phosphatase: 155 U/L — ABNORMAL HIGH (ref 38–126)
Anion gap: 13 (ref 5–15)
BUN: 23 mg/dL (ref 8–23)
CO2: 25 mmol/L (ref 22–32)
Calcium: 8.8 mg/dL — ABNORMAL LOW (ref 8.9–10.3)
Chloride: 96 mmol/L — ABNORMAL LOW (ref 98–111)
Creatinine, Ser: 1.62 mg/dL — ABNORMAL HIGH (ref 0.61–1.24)
GFR, Estimated: 39 mL/min — ABNORMAL LOW (ref >60)
Glucose, Bld: 124 mg/dL — ABNORMAL HIGH (ref 70–99)
Potassium: 4.2 mmol/L (ref 3.5–5.1)
Sodium: 134 mmol/L — ABNORMAL LOW (ref 135–145)
Total Bilirubin: 0.8 mg/dL (ref 0.3–1.2)
Total Protein: 7.1 g/dL (ref 6.5–8.1)

## 2023-10-03 LAB — APTT: aPTT: 38 s — ABNORMAL HIGH (ref 24–36)

## 2023-10-03 LAB — I-STAT CG4 LACTIC ACID, ED: Lactic Acid, Venous: 1.4 mmol/L (ref 0.5–1.9)

## 2023-10-03 LAB — BRAIN NATRIURETIC PEPTIDE: B Natriuretic Peptide: 526.7 pg/mL — ABNORMAL HIGH (ref 0.0–100.0)

## 2023-10-03 LAB — PROTIME-INR
INR: 1.3 — ABNORMAL HIGH (ref 0.8–1.2)
Prothrombin Time: 16.1 s — ABNORMAL HIGH (ref 11.4–15.2)

## 2023-10-03 LAB — STREP PNEUMONIAE URINARY ANTIGEN: Strep Pneumo Urinary Antigen: NEGATIVE

## 2023-10-03 LAB — SARS CORONAVIRUS 2 BY RT PCR: SARS Coronavirus 2 by RT PCR: NEGATIVE

## 2023-10-03 MED ORDER — FUROSEMIDE 10 MG/ML IJ SOLN
40.0000 mg | Freq: Once | INTRAMUSCULAR | Status: AC
  Administered 2023-10-03: 40 mg via INTRAVENOUS
  Filled 2023-10-03: qty 4

## 2023-10-03 MED ORDER — SENNOSIDES-DOCUSATE SODIUM 8.6-50 MG PO TABS: 1.0000 | ORAL_TABLET | Freq: Every evening | ORAL | Status: DC | PRN

## 2023-10-03 MED ORDER — APIXABAN 2.5 MG PO TABS
2.5000 mg | ORAL_TABLET | Freq: Two times a day (BID) | ORAL | Status: DC
  Administered 2023-10-03 – 2023-10-06 (×6): 2.5 mg via ORAL
  Filled 2023-10-03 (×6): qty 1

## 2023-10-03 MED ORDER — DULOXETINE HCL 30 MG PO CPEP
60.0000 mg | ORAL_CAPSULE | Freq: Every day | ORAL | Status: DC
  Administered 2023-10-04 – 2023-10-06 (×3): 60 mg via ORAL
  Filled 2023-10-03 (×3): qty 2

## 2023-10-03 MED ORDER — SODIUM CHLORIDE 0.9 % IV SOLN: 500.0000 mg | INTRAVENOUS | Status: DC

## 2023-10-03 MED ORDER — TRAMADOL HCL 50 MG PO TABS
50.0000 mg | ORAL_TABLET | Freq: Two times a day (BID) | ORAL | Status: DC
  Administered 2023-10-03 – 2023-10-06 (×6): 50 mg via ORAL
  Filled 2023-10-03 (×6): qty 1

## 2023-10-03 MED ORDER — ACETAMINOPHEN 650 MG RE SUPP: 650.0000 mg | Freq: Four times a day (QID) | RECTAL | Status: DC | PRN

## 2023-10-03 MED ORDER — ACETAMINOPHEN 325 MG PO TABS: 650.0000 mg | ORAL_TABLET | Freq: Four times a day (QID) | ORAL | Status: DC | PRN

## 2023-10-03 MED ORDER — ONDANSETRON HCL 4 MG/2ML IJ SOLN: 4.0000 mg | Freq: Four times a day (QID) | INTRAMUSCULAR | Status: DC | PRN

## 2023-10-03 MED ORDER — SODIUM CHLORIDE 0.9 % IV SOLN: 2.0000 g | INTRAVENOUS | Status: DC

## 2023-10-03 MED ORDER — SODIUM CHLORIDE 0.9 % IV SOLN
1.0000 g | Freq: Once | INTRAVENOUS | Status: AC
  Administered 2023-10-03: 1 g via INTRAVENOUS
  Filled 2023-10-03: qty 10

## 2023-10-03 MED ORDER — SODIUM CHLORIDE 0.9% FLUSH
3.0000 mL | Freq: Two times a day (BID) | INTRAVENOUS | Status: DC
  Administered 2023-10-03 – 2023-10-06 (×6): 3 mL via INTRAVENOUS

## 2023-10-03 MED ORDER — ONDANSETRON HCL 4 MG PO TABS: 4.0000 mg | ORAL_TABLET | Freq: Four times a day (QID) | ORAL | Status: DC | PRN

## 2023-10-03 MED ORDER — LEVOTHYROXINE SODIUM 25 MCG PO TABS
25.0000 ug | ORAL_TABLET | Freq: Every day | ORAL | Status: DC
  Administered 2023-10-04 – 2023-10-06 (×3): 25 ug via ORAL
  Filled 2023-10-03 (×3): qty 1

## 2023-10-03 MED ORDER — PREGABALIN 25 MG PO CAPS
50.0000 mg | ORAL_CAPSULE | Freq: Every day | ORAL | Status: DC
  Administered 2023-10-03 – 2023-10-05 (×3): 50 mg via ORAL
  Filled 2023-10-03 (×3): qty 2

## 2023-10-03 MED ORDER — BISACODYL 5 MG PO TBEC: 5.0000 mg | DELAYED_RELEASE_TABLET | Freq: Every day | ORAL | Status: DC | PRN

## 2023-10-03 MED ORDER — SODIUM CHLORIDE 0.9 % IV SOLN
500.0000 mg | Freq: Once | INTRAVENOUS | Status: AC
  Administered 2023-10-03: 500 mg via INTRAVENOUS
  Filled 2023-10-03: qty 5

## 2023-10-03 NOTE — ED Provider Notes (Signed)
Care transferred to me.  Patient's family indicates patient's been having some low-grade fevers, cough, and a little bit of difficulty swallowing.  Given he is hypoxic this could be CHF but I am concerned about pneumonia so he will be started on antibiotics.  Urine also appears infected though it is also coming from a catheter.  His confusion/encephalopathy seems consistent with prior visits per family where he has been confused from an infection.  Discussed with Dr. Allena Katz for admission.   Pricilla Loveless, MD 10/03/23 [phone]

## 2023-10-03 NOTE — ED Triage Notes (Addendum)
Patient arrives by EMS from home with c/o altered mental status and tremors.   Patient has dementia at baseline.  Patient discharged from [facility] 09/10/23 for sepsis/UTI/pneumonia.  Per report home health nurse checked patient's oxygen level to be 84% on RA.  On arrival patient does have foley catheter to leg bag.    EMS VITALS BG 138/84 CO2 25 CBG 160

## 2023-10-03 NOTE — H&P (Signed)
History and Physical    [name] QIH:[id number] DOB: May 01, 1925 DOA: 10/03/2023  PCP: Georgann Housekeeper, MD  Patient coming from: Home  I have personally briefly reviewed patient's old medical records in [facility]  Chief Complaint: Altered mental status, hypoxia  HPI: Douglas Walker is a 89 y.o. male with medical history significant for PAF on Eliquis, HFpEF (EF 50-55%), tachy-brady syndrome s/p PPM, CKD stage IIIa, chronic anemia and thrombocytopenia, HTN, HLD, hypothyroidism, essential tremor, recurrent UTI on chronic Macrobid, BPH with chronic indwelling Foley catheter, dementia who presented to the ED for evaluation of altered mental status and hypoxia.  Patient lives at home with his son.  Ambulates with a walker.  Does also require supplemental O2 at baseline.  He has home health nursing following.  Over the last few days patient has had increased shortness of breath.  He has had new frequent cough productive of greenish/brown sputum.  He has had low-grade fevers at home.  He has had some increased confusion compared to baseline.  Today home health nurse checked patient's oxygen level and found that he was hypoxic to 84% on room air.  He was brought to the ED for further evaluation.  ED Course  Labs/Imaging on admission: I have personally reviewed following labs and imaging studies.  Initial vitals showed BP 149/70, pulse 70, RR 24, temp 9 9.1 F, SpO2 95% on 3 L O2 via [place].  Labs show WBC 10.9, hemoglobin 9.6, platelets 106,000, sodium 134, potassium 4.2, bicarb 25, BUN 23, creatinine 1.62, serum glucose 124, BNP 526.7, lactic acid 1.4.  Blood cultures in process.  UA shows negative nitrates, large leukocytes, 11-20 RBCs, >50 WBCs, many bacteria.  Urine culture in process.  SARS-CoV-2 PCR in process.  Portable chest x-ray shows worsening interstitial opacities and patchy bibasilar opacities suspicious for pulmonary edema or multifocal pneumonia.  Patient was  given IV Lasix 40 mg, IV ceftriaxone and azithromycin.  The hospitalist service was consulted to admit for further evaluation and management.  Review of Systems: All systems reviewed and are negative except as documented in history of present illness above.   Past Medical History:  Diagnosis Date   Anemia    Arthritis    shoulders and back   Cancer (HCC)    skin cancers   Chronic diastolic (congestive) heart failure (HCC)    Chronic low back pain 12/15/2017   CKD (chronic kidney disease) stage 3, GFR 30-59 ml/min (HCC)    Essential tremor 02/28/2016   GERD (gastroesophageal reflux disease)    Hernia    History of hiatal hernia    Hyperlipemia    Hypertension    Macular degeneration    Neuromuscular disorder (HCC)    neuropathy   Paroxysmal atrial fibrillation (HCC) 10/28/2013   Presence of permanent cardiac pacemaker 12/04/2018   Pulmonary hypertension (HCC)    Stroke (HCC) 12/2020   Tachycardia-bradycardia syndrome (HCC) 09/13/2014   Thoracic aortic aneurysm (HCC)    Thrombocytopenia (HCC)    TIA (transient ischemic attack)     Past Surgical History:  Procedure Laterality Date   APPENDECTOMY     EYE SURGERY     HERNIA REPAIR     HERNIA REPAIR     INSERT / REPLACE / REMOVE PACEMAKER  12/04/2018   IRRIGATION AND DEBRIDEMENT ABSCESS Right 07/24/2020   Procedure: IRRIGATION AND DEBRIDEMENT HEMATOMA;  Surgeon: Abigail Miyamoto, MD;  Location: [facility] OR;  Service: General;  Laterality: Right;   MASS EXCISION Right 07/20/2020  Procedure: EXCISION OF RIGHT CHEST WALL MASS;  Surgeon: Almond Lint, MD;  Location: [facility] OR;  Service: General;  Laterality: Right;   PACEMAKER IMPLANT N/A 12/04/2018   Procedure: PACEMAKER IMPLANT;  Surgeon: Marinus Maw, MD;  Location: [facility] INVASIVE CV LAB;  Service: Cardiovascular;  Laterality: N/A;    Social History:  reports that he quit smoking about 60 years ago. His smoking use included cigarettes. He has never used smokeless tobacco. He reports  that he does not drink alcohol and does not use drugs.  Allergies  Allergen Reactions   Sulfa Antibiotics Other (See Comments)    Weakness Dizziness  Sweats    Mysoline [Primidone] Other (See Comments)    Sedation    Zocor [Simvastatin] Other (See Comments)    Arthralgias Fatigue    Family History  Problem Relation Age of Onset   GI problems Mother    Other Sister        PAIN ISSUES   Hearing loss Sister    Blindness Sister    Heart attack Neg Hx    Stroke Neg Hx      Prior to Admission medications   Medication Sig Start Date End Date Taking? Authorizing Provider  acetaminophen (TYLENOL) 325 MG tablet Take 2 tablets (650 mg total) by mouth every 4 (four) hours as needed for mild pain (or temp > 37.5 C (99.5 F)). 02/14/21   Angiulli, Mcarthur Rossetti, PA-C  apixaban (ELIQUIS) 2.5 MG TABS tablet Take 1 tablet (2.5 mg total) by mouth 2 (two) times daily. 09/24/22   Dunn, Tacey Ruiz, PA-C  Ascorbic Acid (VITAMIN C) 1000 MG tablet Take 1,000 mg by mouth daily.    [provider]  DULoxetine (CYMBALTA) 60 MG capsule Take 1 capsule (60 mg total) by mouth daily. 04/23/21   Lovorn, Aundra Millet, MD  furosemide (LASIX) 40 MG tablet Take 1 tablet (40 mg total) by mouth daily. 10/02/23   Christell Constant, MD  levothyroxine (SYNTHROID) 25 MCG tablet Take 1 tablet (25 mcg total) by mouth daily before breakfast. 02/14/21   Angiulli, Mcarthur Rossetti, PA-C  liver oil-zinc oxide (DESITIN) 40 % ointment Apply topically 2 (two) times daily. To sacral region 08/12/23   Jerald Kief, MD  loratadine (CLARITIN) 10 MG tablet Take 10 mg by mouth daily.    [provider]  Multiple Vitamin (MULTIVITAMIN WITH MINERALS) TABS tablet Take 1 tablet by mouth daily.    [provider]  Multiple Vitamins-Minerals (OCUVITE ADULT 50+) CAPS Take 1 capsule by mouth daily.    [provider]  nitrofurantoin, macrocrystal-monohydrate, (MACROBID) 100 MG capsule Take 100 mg by mouth at bedtime. 09/15/23    [provider]  nystatin ointment (MYCOSTATIN) Apply topically 2 (two) times daily. Apply to rash over R face 08/12/23   Jerald Kief, MD  polyethylene glycol (MIRALAX / GLYCOLAX) 17 g packet Take 17 g by mouth daily. 07/08/21   Narda Bonds, MD  pregabalin (LYRICA) 50 MG capsule Take 50 mg by mouth at bedtime.    [provider]  traMADol (ULTRAM) 50 MG tablet Take 1 tablet (50 mg total) by mouth 2 (two) times daily. 08/12/23   Jerald Kief, MD  vitamin B-12 (CYANOCOBALAMIN) 1000 MCG tablet Take 1 tablet (1,000 mcg total) by mouth every other day. 02/14/21   Charlton Amor, PA-C    Physical Exam: Vitals:   10/03/23 1422  BP: (!) 149/70  Pulse: 69  Resp: (!) 24  Temp: 99.1 F (37.3  C)  TempSrc: Oral  SpO2: 95%  Weight: 83.5 kg  Height: 5\' 9"  (1.753 m)   Constitutional: Elderly man resting in bed, NAD, calm, comfortable Eyes: EOMI, lids and conjunctivae normal ENMT: Mucous membranes are moist. Posterior pharynx clear of any exudate or lesions.Normal dentition.  Neck: normal, supple, no masses. Respiratory: Bibasilar inspiratory crackles, no wheezing, no crackles. Normal respiratory effort while on 2.5 L via [place]. No accessory muscle use.  Cardiovascular: Regular rate and rhythm, no murmurs / rubs / gallops. No extremity edema. 2+ pedal pulses.  PPM in place left chest wall. Abdomen: no tenderness, no masses palpated.  Musculoskeletal: no clubbing / cyanosis. No joint deformity upper and lower extremities. Good ROM, no contractures. Normal muscle tone.  Skin: Scattered bruising upper extremities. No induration Neurologic: Tremor of the jaw, sensation intact. Strength 5/5 in all 4.  Psychiatric:  Alert and oriented x 3. Normal mood.   EKG: Personally reviewed.  V-paced rhythm.  Assessment/Plan Principal Problem:   Acute respiratory failure with hypoxia (HCC) Active Problems:   Acute kidney injury superimposed on chronic kidney disease (HCC)   Multifocal  pneumonia   Acute on chronic heart failure with preserved ejection fraction (HFpEF) (HCC)   Chronic indwelling Foley catheter   Normocytic anemia   Paroxysmal atrial fibrillation (HCC)   Hypothyroidism   Thrombocytopenia (HCC)   Douglas Walker is a 89 y.o. male with medical history significant for PAF on Eliquis, HFpEF (EF 50-55%), tachy-brady syndrome s/p PPM, CKD stage IIIa, chronic anemia and thrombocytopenia, HTN, HLD, hypothyroidism, essential tremor, recurrent UTI on chronic Macrobid, BPH with chronic indwelling Foley catheter, dementia who is admitted with acute hypoxic respiratory failure due to multifocal pneumonia and acute on chronic HFpEF.  Assessment and Plan: Acute hypoxic respiratory failure Multifocal pneumonia: Likely multifactorial due to multifocal pneumonia and CHF exacerbation.  Does not require supplemental O2 at baseline, on 2.5 L [place] on admission.  Family is concerned about potential aspiration events after eating recently. -Continue IV ceftriaxone and azithromycin -Follow blood cultures, strep pneumonia and Legionella urine antigens -Continue supplemental O2 and wean as able -IS, [facility] -SLP eval  Acute on chronic HFpEF: Likely some element of pulmonary edema contributing to hypoxia.  BNP 526.7. -S/p IV Lasix 40 mg in the ED, give additional dose tonight and monitor response -Strict I/O's and daily weights  Acute kidney injury superimposed on CKD stage IIIa: Creatinine 1.62 on admission compared to baseline 1.2-1.3.  Recheck a.m. labs after diuresis.  BPH with chronic indwelling Foley catheter Abnormal urinalysis: Chronic Foley in place on admission.  UA shows negative nitrates, large leukocytes, 11-20 RBCs, >50 WBCs, many bacteria.  Likely colonized although acute infection possible.  He is on ceftriaxone as above.  Follow urine culture.  Paroxysmal atrial fibrillation Tachybradycardia syndrome s/p PPM: Stable, continue Eliquis.  Not on rate/rhythm meds at  home.  Chronic anemia and thrombocytopenia: Chronic and stable.  Hypothyroidism: Continue Synthroid.  Chronic pain: Continue Lyrica and tramadol.  Dementia: Mood is stable.  Delirium precautions.   DVT prophylaxis: apixaban (ELIQUIS) tablet 2.5 mg Start: 10/03/23 2200 apixaban (ELIQUIS) tablet 2.5 mg   Code Status:   Code Status: Limited: Do not attempt resuscitation (DNR) -DNR-LIMITED -Do Not Intubate/DNI confirmed with son on admission.  ACP documents reviewed. Family Communication: Son at bedside Disposition Plan: From home, dispo pending clinical progress Consults called: None Severity of Illness: The appropriate patient status for this patient is INPATIENT. Inpatient status is judged to be reasonable and necessary in order  to provide the required intensity of service to ensure the patient's safety. The patient's presenting symptoms, physical exam findings, and initial radiographic and laboratory data in the context of their chronic comorbidities is felt to place them at high risk for further clinical deterioration. Furthermore, it is not anticipated that the patient will be medically stable for discharge from the hospital within 2 midnights of admission.   * I certify that at the point of admission it is my clinical judgment that the patient will require inpatient hospital care spanning beyond 2 midnights from the point of admission due to high intensity of service, high risk for further deterioration and high frequency of surveillance required.Darreld Mclean MD [facility]  If 7PM-7AM, please contact night-coverage [email or web address]  10/03/2023, 6:24 PM

## 2023-10-03 NOTE — ED Provider Notes (Signed)
[place] EMERGENCY DEPARTMENT AT [facility] Provider Note   CSN: [id number] Arrival date & time: 10/03/23  1403     History  Chief Complaint  Patient presents with   Altered Mental Status          Douglas Walker is a 89 y.o. male.  Patient is a 89 year old male with a past medical history of dementia, hypothyroidism, CHF, CKD, sick sinus syndrome with pacemaker in place and recent admission for pneumonia and UTI presenting to the emergency department with altered mental status.  Per EMS, the patient was discharged from the hospital yesterday.  He had a home health aide come today to evaluate him and he was more confused and weak than his usual.  They checked his vitals and found his oxygen to be 84% on room air and called 911.  They state he does not wear oxygen at baseline.  The patient has no complaints to me and denies any pain.  He states that he has had a cough but denies significant shortness of breath.  He denies any nausea, vomiting or diarrhea and states that he has been eating and drinking normally.  For records, the patient was seen in the cardiology clinic yesterday.  Does have a history of an essential tremor and A-fib.  Did appear to be volume overloaded and unclear if he has been taking his Lasix.  The history is provided by the patient, the EMS personnel and medical records. The history is limited by the condition of the patient (Level 5 caveat for dementia).  Altered Mental Status      Home Medications Prior to Admission medications   Medication Sig Start Date End Date Taking? Authorizing Provider  acetaminophen (TYLENOL) 325 MG tablet Take 2 tablets (650 mg total) by mouth every 4 (four) hours as needed for mild pain (or temp > 37.5 C (99.5 F)). 02/14/21   Angiulli, Mcarthur Rossetti, PA-C  apixaban (ELIQUIS) 2.5 MG TABS tablet Take 1 tablet (2.5 mg total) by mouth 2 (two) times daily. 09/24/22   Dunn, Tacey Ruiz, PA-C  Ascorbic Acid (VITAMIN C) 1000 MG tablet  Take 1,000 mg by mouth daily.    [provider]  DULoxetine (CYMBALTA) 60 MG capsule Take 1 capsule (60 mg total) by mouth daily. 04/23/21   Lovorn, Aundra Millet, MD  furosemide (LASIX) 40 MG tablet Take 1 tablet (40 mg total) by mouth daily. 10/02/23   Christell Constant, MD  levothyroxine (SYNTHROID) 25 MCG tablet Take 1 tablet (25 mcg total) by mouth daily before breakfast. 02/14/21   Angiulli, Mcarthur Rossetti, PA-C  liver oil-zinc oxide (DESITIN) 40 % ointment Apply topically 2 (two) times daily. To sacral region 08/12/23   Jerald Kief, MD  loratadine (CLARITIN) 10 MG tablet Take 10 mg by mouth daily.    [provider]  Multiple Vitamin (MULTIVITAMIN WITH MINERALS) TABS tablet Take 1 tablet by mouth daily.    [provider]  Multiple Vitamins-Minerals (OCUVITE ADULT 50+) CAPS Take 1 capsule by mouth daily.    [provider]  nitrofurantoin, macrocrystal-monohydrate, (MACROBID) 100 MG capsule Take 100 mg by mouth at bedtime. 09/15/23   [provider]  nystatin ointment (MYCOSTATIN) Apply topically 2 (two) times daily. Apply to rash over R face 08/12/23   Jerald Kief, MD  polyethylene glycol (MIRALAX / GLYCOLAX) 17 g packet Take 17 g by mouth daily. 07/08/21   Narda Bonds, MD  pregabalin (LYRICA) 50 MG capsule Take 50 mg  by mouth at bedtime.    [provider]  traMADol (ULTRAM) 50 MG tablet Take 1 tablet (50 mg total) by mouth 2 (two) times daily. 08/12/23   Jerald Kief, MD  vitamin B-12 (CYANOCOBALAMIN) 1000 MCG tablet Take 1 tablet (1,000 mcg total) by mouth every other day. 02/14/21   Angiulli, Mcarthur Rossetti, PA-C      Allergies    Sulfa antibiotics, Mysoline [primidone], and Zocor [simvastatin]    Review of Systems   Review of Systems  Physical Exam Updated Vital Signs BP (!) 149/70   Pulse 69   Temp 99.1 F (37.3 C) (Oral)   Resp (!) 24   Ht 5\' 9"  (1.753 m)   Wt 83.5 kg   SpO2 95%   BMI 27.18 kg/m  Physical Exam Vitals and  nursing note reviewed.  Constitutional:      General: He is not in acute distress.    Appearance: Normal appearance.  HENT:     Head: Normocephalic and atraumatic.     Nose: Nose normal.     Mouth/Throat:     Mouth: Mucous membranes are dry.     Pharynx: Oropharynx is clear.  Eyes:     Extraocular Movements: Extraocular movements intact.     Conjunctiva/sclera: Conjunctivae normal.  Cardiovascular:     Rate and Rhythm: Normal rate and regular rhythm.     Heart sounds: Normal heart sounds.  Pulmonary:     Effort: Pulmonary effort is normal.     Breath sounds: Rales (Bilateral bases) present.  Abdominal:     General: Abdomen is flat.     Palpations: Abdomen is soft.     Tenderness: There is no abdominal tenderness.  Musculoskeletal:        General: Normal range of motion.     Cervical back: Normal range of motion.  Skin:    General: Skin is warm and dry.  Neurological:     General: No focal deficit present.     Mental Status: He is alert. Mental status is at baseline.  Psychiatric:        Mood and Affect: Mood normal.        Behavior: Behavior normal.     ED Results / Procedures / Treatments   Labs (all labs ordered are listed, but only abnormal results are displayed) Labs Reviewed  PROTIME-INR - Abnormal; Notable for the following components:      Result Value   Prothrombin Time 16.1 (*)    INR 1.3 (*)    All other components within normal limits  APTT - Abnormal; Notable for the following components:   aPTT 38 (*)    All other components within normal limits  CULTURE, BLOOD (ROUTINE X 2)  CULTURE, BLOOD (ROUTINE X 2)  SARS CORONAVIRUS 2 BY RT PCR  COMPREHENSIVE METABOLIC PANEL  CBC WITH DIFFERENTIAL/PLATELET  URINALYSIS, W/ REFLEX TO CULTURE (INFECTION SUSPECTED)  BRAIN NATRIURETIC PEPTIDE  I-STAT CG4 LACTIC ACID, ED    EKG EKG Interpretation Date/Time:  Friday October 03 2023 14:16:33 EDT Ventricular Rate:  70 PR Interval:    QRS Duration:  132 QT  Interval:  426 QTC Calculation: 460 R Axis:   28  Text Interpretation: Accelerated junctional rhythm Ventricular preexcitation(WPW) No significant change since last tracing Confirmed by Elayne Snare ([id number]) on 10/03/2023 2:59:38 PM  Radiology No results found.  Procedures Procedures    Medications Ordered in ED Medications  furosemide (LASIX) injection 40 mg (has no administration in time range)  ED Course/ Medical Decision Making/ A&P Clinical Course as of 10/03/23 1543  Fri Oct 03, 2023  1542 Patient signed out to Dr. Criss Alvine pending labs with plan for admission. IV lasix ordered as CXR appears to have pulmonary edema. [VK]    Clinical Course User Index [VK] Rexford Maus, DO                                 Medical Decision Making This patient presents to the ED with chief complaint(s) of AMS, hypoxia with pertinent past medical history of CHF, A fib on Eliquis, HTN, CKD, dementia, essential tremor which further complicates the presenting complaint. The complaint involves an extensive differential diagnosis and also carries with it a high risk of complications and morbidity.    The differential diagnosis includes ACS, arrhythmia, anemia, pneumonia, pneumothorax, pulmonary edema, pleural effusion, dehydration, electrolyte abnormality, infection  Additional history obtained: Additional history obtained from EMS  Records reviewed outpatient cardiology records  ED Course and Reassessment: Patient's arrival to the emergency department he was initially on nonrebreather by EMS and transition to 3 L nasal cannula satting in the low 90s in no acute respiratory distress.  He does have crackles at bilateral bases of his lungs.  Reviewing his chart it does appear that he recently increased his diuretic and has not actually had a recent infection and all of workup to evaluate for cause of his hypoxia with possible CHF exacerbation.  Independent labs interpretation:  The  following labs were independently interpreted: pending  Independent visualization of imaging: - I independently visualized the following imaging with scope of interpretation limited to determining acute life threatening conditions related to emergency care: CXR, which revealed pulmonary edema, pending final radiology read     Amount and/or Complexity of Data Reviewed Labs: ordered. Radiology: ordered. ECG/medicine tests: ordered.  Risk Prescription drug management.          Final Clinical Impression(s) / ED Diagnoses Final diagnoses:  None    Rx / DC Orders ED Discharge Orders     None         Rexford Maus, DO 10/03/23 1543

## 2023-10-03 NOTE — Hospital Course (Signed)
[name] was admitted to the hospital with the working diagnosis of acute hypoxemic respiratory failure due to pneumonia and heart failure decompensation.    89 y.o. male with medical history significant for PAF, heart failure, tachy-brady syndrome s/p PPM, CKD stage IIIa, chronic anemia and thrombocytopenia, HTN, HLD, hypothyroidism, essential tremor, recurrent UTI, BPH with chronic indwelling Foley catheter, and dementia who presented with altered mental status. He was noted to have worsening dyspnea for the last few days prior to admission, positive productive cough, congestion and low grade fevers. Worsening confusion compared to his baseline. On the day of admission home health nurse found patient hypoxemic at 84% on room air, EMS was called and patient was transported to the ED.  Recent hospitalization 07/31 to 08/12/23 for septic shock due to community acquired pneumonia and recurrent UTI. He was discharged to SNF, from where he was discharged home on 09/11/23.   In the ED his blood pressure was 149/70, HR 70, RR 24, 02 saturation 95% on 3 L/min per [place], lungs with bibasilar rales, with no wheezing, heart with S1 and S2 present and regular, no gallops, rubs or murmurs, abdomen with no distention and no lower extremity edema.   Na 134, K 4,2 Cl 96, bicarbonate 25, glucose 124, bun 23, cr 1,62  AST 30, ALT 26  BNP 526  Lactic acid 1,4 Wbc 10,9 hgb 9,6 plt 106   Chest radiograph with cardiomegaly, bilateral patchy interstitial infiltrates at bases more dense at the left side, with less intense at the left upper lobe.   EKG 70 bpm, normal axis, qtc 460, left bundle branch, ventricular paced rhythm, with underlying atrial fibrillation, no significant ST segment changes, no significant T wave changes.   Patient was placed on antibiotic therapy and diuretics.   10/06 patient responding well to antibiotic therapy.

## 2023-10-03 NOTE — ED Notes (Signed)
ED TO INPATIENT HANDOFF REPORT  ED Nurse Name and Phone #: Ellyanna Holton [phone]  S Name/Age/Gender Douglas Walker 89 y.o. male Room/Bed: 029C/029C  Code Status   Code Status: Limited: Do not attempt resuscitation (DNR) -DNR-LIMITED -Do Not Intubate/DNI   Home/SNF/Other Home Patient oriented to: self Is this baseline? Yes   Triage Complete: Triage complete  Chief Complaint Acute respiratory failure with hypoxia (HCC) [J96.01]  Triage Note Patient arrives by EMS from home with c/o altered mental status and tremors.   Patient has dementia at baseline.  Patient discharged from [facility] 09/10/23 for sepsis/UTI/pneumonia.  Per report home health nurse checked patient's oxygen level to be 84% on RA.  On arrival patient does have foley catheter to leg bag.    EMS VITALS BG 138/84 CO2 25 CBG 160    Allergies Allergies  Allergen Reactions   Sulfa Antibiotics Other (See Comments)    Weakness Dizziness  Sweats    Mysoline [Primidone] Other (See Comments)    Sedation    Zocor [Simvastatin] Other (See Comments)    Arthralgias Fatigue    Level of Care/Admitting Diagnosis ED Disposition     ED Disposition  Admit   Condition  --   Comment  Hospital Area: [facility] [[id number]]  Level of Care: Telemetry Cardiac [[id number]]  May admit patient to [facility] or [facility] if equivalent level of care is available:: No  Covid Evaluation: Symptomatic Person Under Investigation (PUI) or recent exposure (last 10 days) *Testing Required*  Diagnosis: Acute respiratory failure with hypoxia [facility]) [[id number]]  Admitting Physician: Charlsie Quest [[id number]]  Attending Physician: Charlsie Quest [[id number]]  Certification:: I certify this patient will need inpatient services for at least 2 midnights  Expected Medical Readiness: 10/06/2023          B Medical/Surgery History Past Medical History:  Diagnosis Date   Anemia    Arthritis    shoulders and back   Cancer (HCC)     skin cancers   Chronic diastolic (congestive) heart failure (HCC)    Chronic low back pain 12/15/2017   CKD (chronic kidney disease) stage 3, GFR 30-59 ml/min (HCC)    Essential tremor 02/28/2016   GERD (gastroesophageal reflux disease)    Hernia    History of hiatal hernia    Hyperlipemia    Hypertension    Macular degeneration    Neuromuscular disorder (HCC)    neuropathy   Paroxysmal atrial fibrillation (HCC) 10/28/2013   Presence of permanent cardiac pacemaker 12/04/2018   Pulmonary hypertension (HCC)    Stroke (HCC) 12/2020   Tachycardia-bradycardia syndrome (HCC) 09/13/2014   Thoracic aortic aneurysm (HCC)    Thrombocytopenia (HCC)    TIA (transient ischemic attack)    Past Surgical History:  Procedure Laterality Date   APPENDECTOMY     EYE SURGERY     HERNIA REPAIR     HERNIA REPAIR     INSERT / REPLACE / REMOVE PACEMAKER  12/04/2018   IRRIGATION AND DEBRIDEMENT ABSCESS Right 07/24/2020   Procedure: IRRIGATION AND DEBRIDEMENT HEMATOMA;  Surgeon: Abigail Miyamoto, MD;  Location: [facility] OR;  Service: General;  Laterality: Right;   MASS EXCISION Right 07/20/2020   Procedure: EXCISION OF RIGHT CHEST WALL MASS;  Surgeon: Almond Lint, MD;  Location: [facility] OR;  Service: General;  Laterality: Right;   PACEMAKER IMPLANT N/A 12/04/2018   Procedure: PACEMAKER IMPLANT;  Surgeon: Marinus Maw, MD;  Location: [facility] INVASIVE CV LAB;  Service: Cardiovascular;  Laterality: N/A;  A IV Location/Drains/Wounds Patient Lines/Drains/Airways Status     Active Line/Drains/Airways     Name Placement date Placement time Site Days   Peripheral IV 10/03/23 18 G Left Forearm 10/03/23  --  Forearm  less than 1   Peripheral IV 10/03/23 20 G Anterior;Right Forearm 10/03/23  1455  Forearm  less than 1   Urethral Catheter Amber M, RN (coude-trained) Coude 20 Fr. 07/30/23  1650  Coude  65   Pressure Injury 07/30/23 Sacrum Deep Tissue Pressure Injury - Purple or maroon localized area of discolored  intact skin or blood-filled blister due to damage of underlying soft tissue from pressure and/or shear. 07/30/23  1156  -- 65   Pressure Injury 07/30/23 Hip Right Unstageable - Full thickness tissue loss in which the base of the injury is covered by slough (yellow, tan, gray, green or brown) and/or eschar (tan, brown or black) in the wound bed. 07/30/23  1156  -- 65   Wound / Incision (Open or Dehisced) 10/14/22 Skin tear Elbow Left;Posterior 10/14/22  1836  Elbow  354            Intake/Output Last 24 hours No intake or output data in the 24 hours ending 10/03/23 1830  Labs/Imaging Results for orders placed or performed during the hospital encounter of 10/03/23 (from the past 48 hour(s))  Comprehensive metabolic panel     Status: Abnormal   Collection Time: 10/03/23  2:30 PM  Result Value Ref Range   Sodium 134 (L) 135 - 145 mmol/L   Potassium 4.2 3.5 - 5.1 mmol/L   Chloride 96 (L) 98 - 111 mmol/L   CO2 25 22 - 32 mmol/L   Glucose, Bld 124 (H) 70 - 99 mg/dL    Comment: Glucose reference range applies only to samples taken after fasting for at least 8 hours.   BUN 23 8 - 23 mg/dL   Creatinine, Ser 3.71 (H) 0.61 - 1.24 mg/dL   Calcium 8.8 (L) 8.9 - 10.3 mg/dL   Total Protein 7.1 6.5 - 8.1 g/dL   Albumin 2.9 (L) 3.5 - 5.0 g/dL   AST 30 15 - 41 U/L   ALT 26 0 - 44 U/L   Alkaline Phosphatase 155 (H) 38 - 126 U/L   Total Bilirubin 0.8 0.3 - 1.2 mg/dL   GFR, Estimated 39 (L) >60 mL/min    Comment: (NOTE) Calculated using the CKD-EPI Creatinine Equation (2021)    Anion gap 13 5 - 15    Comment: Performed at [facility], [address] [place], [place] [address]  CBC with Differential     Status: Abnormal   Collection Time: 10/03/23  2:30 PM  Result Value Ref Range   WBC 10.9 (H) 4.0 - 10.5 K/uL   RBC 3.19 (L) 4.22 - 5.81 MIL/uL   Hemoglobin 9.6 (L) 13.0 - 17.0 g/dL   HCT 48.5 (L) 46.2 - 70.3 %   MCV 90.9 80.0 - 100.0 fL   MCH 30.1 26.0 - 34.0 pg   MCHC 33.1 30.0 - 36.0  g/dL   RDW 50.0 (H) 93.8 - 18.2 %   Platelets 106 (L) 150 - 400 K/uL    Comment: SPECIMEN CHECKED FOR CLOTS Immature Platelet Fraction may be clinically indicated, consider ordering this additional test [id number] REPEATED TO VERIFY PLATELET COUNT CONFIRMED BY SMEAR    nRBC 0.0 0.0 - 0.2 %   Neutrophils Relative % 78 %   Neutro Abs 8.4 (H) 1.7 - 7.7 K/uL  Lymphocytes Relative 8 %   Lymphs Abs 0.9 0.7 - 4.0 K/uL   Monocytes Relative 13 %   Monocytes Absolute 1.5 (H) 0.1 - 1.0 K/uL   Eosinophils Relative 0 %   Eosinophils Absolute 0.0 0.0 - 0.5 K/uL   Basophils Relative 0 %   Basophils Absolute 0.0 0.0 - 0.1 K/uL   Immature Granulocytes 1 %   Abs Immature Granulocytes 0.11 (H) 0.00 - 0.07 K/uL    Comment: Performed at [facility] Lab, [address], [place], [place] [address]  Protime-INR     Status: Abnormal   Collection Time: 10/03/23  2:30 PM  Result Value Ref Range   Prothrombin Time 16.1 (H) 11.4 - 15.2 seconds   INR 1.3 (H) 0.8 - 1.2    Comment: (NOTE) INR goal varies based on device and disease states. Performed at [facility], [address], [place], [place] [address]   APTT     Status: Abnormal   Collection Time: 10/03/23  2:30 PM  Result Value Ref Range   aPTT 38 (H) 24 - 36 seconds    Comment:        IF BASELINE aPTT IS ELEVATED, SUGGEST PATIENT RISK ASSESSMENT BE USED TO DETERMINE APPROPRIATE ANTICOAGULANT THERAPY. Performed at [facility], [address] [place], [place] [address]   Blood Culture (routine x 2)     Status: None (Preliminary result)   Collection Time: 10/03/23  2:55 PM   Specimen: BLOOD  Result Value Ref Range   Specimen Description BLOOD SITE NOT SPECIFIED    Special Requests      BOTTLES DRAWN AEROBIC AND ANAEROBIC Blood Culture results may not be optimal due to an excessive volume of blood received in culture bottles Performed at [facility], [address] [place], [place] [address]    Culture PENDING     Report Status PENDING   Brain natriuretic peptide     Status: Abnormal   Collection Time: 10/03/23  2:55 PM  Result Value Ref Range   B Natriuretic Peptide 526.7 (H) 0.0 - 100.0 pg/mL    Comment: Performed at [facility], [address], [place], [place] [address]  I-Stat Lactic Acid, ED     Status: None   Collection Time: 10/03/23  3:18 PM  Result Value Ref Range   Lactic Acid, Venous 1.4 0.5 - 1.9 mmol/L  SARS Coronavirus 2 by RT PCR (hospital order, performed in [facility] hospital lab) *cepheid single result test* Anterior Nasal Swab     Status: None   Collection Time: 10/03/23  3:24 PM   Specimen: Anterior Nasal Swab  Result Value Ref Range   SARS Coronavirus 2 by RT PCR NEGATIVE NEGATIVE    Comment: Performed at [facility], [address] [place], [place] [address]  Urinalysis, w/ Reflex to Culture (Infection Suspected) -Urine, Catheterized; Indwelling urinary catheter     Status: Abnormal   Collection Time: 10/03/23  3:30 PM  Result Value Ref Range   Specimen Source URINE, CATHETERIZED    Color, Urine AMBER (A) YELLOW    Comment: BIOCHEMICALS MAY BE AFFECTED BY COLOR   APPearance CLOUDY (A) CLEAR   Specific Gravity, Urine 1.009 1.005 - 1.030   pH 7.0 5.0 - 8.0   Glucose, UA NEGATIVE NEGATIVE mg/dL   Hgb urine dipstick SMALL (A) NEGATIVE   Bilirubin Urine NEGATIVE NEGATIVE   Ketones, ur NEGATIVE NEGATIVE mg/dL   Protein, ur NEGATIVE NEGATIVE  mg/dL   Nitrite NEGATIVE NEGATIVE   Leukocytes,Ua LARGE (A) NEGATIVE   RBC / HPF 11-20 0 - 5 RBC/hpf   WBC, UA >50 0 - 5 WBC/hpf    Comment:        Reflex urine culture not performed if WBC <=10, OR if Squamous epithelial cells >5. If Squamous epithelial cells >5 suggest recollection.    Bacteria, UA MANY (A) NONE SEEN   Squamous Epithelial / HPF 0-5 0 - 5 /HPF   WBC Clumps PRESENT    Mucus PRESENT    Amorphous Crystal PRESENT     Comment: Performed at [facility] Lab, [address], [place], [place]  [address]   DG Chest Port 1 View  Result Date: 10/03/2023 CLINICAL DATA:  Questionable sepsis - evaluate for abnormality EXAM: PORTABLE CHEST 1 VIEW COMPARISON:  07/30/2023 FINDINGS: Dual lead left-sided pacemaker in place. Similar cardiomegaly. Worsening interstitial opacities. Progressive patchy bibasilar opacities. Suspected small pleural effusions. No pneumothorax. IMPRESSION: Worsening interstitial opacities and patchy bibasilar opacities, suspicious for pulmonary edema or multifocal pneumonia. Suspected small pleural effusions. Electronically Signed   By: Narda Rutherford M.D.   On: 10/03/2023 16:34    Pending Labs Unresulted Labs (From admission, onward)     Start     Ordered   10/04/23 0500  Magnesium  Tomorrow morning,   R        10/03/23 1815   10/04/23 0500  CBC  Tomorrow morning,   R        10/03/23 1815   10/04/23 0500  Basic metabolic panel  Tomorrow morning,   R        10/03/23 1815   10/04/23 0500  Procalcitonin  Tomorrow morning,   R       References:    Procalcitonin Lower Respiratory Tract Infection AND Sepsis Procalcitonin Algorithm   10/03/23 1817   10/03/23 1813  Strep pneumoniae urinary antigen  (COPD / Pneumonia / Cellulitis / Lower Extremity Wound)  Once,   R        10/03/23 1815   10/03/23 1813  Legionella Pneumophila Serogp 1 Ur Ag  (COPD / Pneumonia / Cellulitis / Lower Extremity Wound)  Once,   R        10/03/23 1815   10/03/23 1530  Urine Culture  Once,   R        10/03/23 1530   10/03/23 1430  Blood Culture (routine x 2)  (Undifferentiated presentation (screening labs and basic nursing orders))  BLOOD CULTURE X 2,   STAT      10/03/23 1429            Vitals/Pain Today's Vitals   10/03/23 1422  BP: (!) 149/70  Pulse: 69  Resp: (!) 24  Temp: 99.1 F (37.3 C)  TempSrc: Oral  SpO2: 95%  Weight: 83.5 kg  Height: 5\' 9"  (1.753 m)  PainSc: 0-No pain    Isolation Precautions No active isolations  Medications Medications  cefTRIAXone (ROCEPHIN) 2  g in sodium chloride 0.9 % 100 mL IVPB (has no administration in time range)  azithromycin (ZITHROMAX) 500 mg in sodium chloride 0.9 % 250 mL IVPB (has no administration in time range)  sodium chloride flush (NS) 0.9 % injection 3 mL (has no administration in time range)  acetaminophen (TYLENOL) tablet 650 mg (has no administration in time range)    Or  acetaminophen (TYLENOL) suppository 650 mg (has no administration in time range)  ondansetron (ZOFRAN) tablet 4 mg (has  no administration in time range)    Or  ondansetron (ZOFRAN) injection 4 mg (has no administration in time range)  senna-docusate (Senokot-S) tablet 1 tablet (has no administration in time range)  bisacodyl (DULCOLAX) EC tablet 5 mg (has no administration in time range)  apixaban (ELIQUIS) tablet 2.5 mg (has no administration in time range)  DULoxetine (CYMBALTA) DR capsule 60 mg (has no administration in time range)  levothyroxine (SYNTHROID) tablet 25 mcg (has no administration in time range)  pregabalin (LYRICA) capsule 50 mg (has no administration in time range)  traMADol (ULTRAM) tablet 50 mg (has no administration in time range)  furosemide (LASIX) injection 40 mg (has no administration in time range)  furosemide (LASIX) injection 40 mg (40 mg Intravenous Given 10/03/23 1607)  cefTRIAXone (ROCEPHIN) 1 g in sodium chloride 0.9 % 100 mL IVPB (0 g Intravenous Stopped 10/03/23 1648)  azithromycin (ZITHROMAX) 500 mg in sodium chloride 0.9 % 250 mL IVPB (0 mg Intravenous Stopped 10/03/23 1722)    Mobility walks with device     Focused Assessments Pulmonary Assessment Handoff:  Lung sounds: L Breath Sounds: Diminished O2 Device: Nasal Cannula O2 Flow Rate (L/min): 3 L/min Not on oxygen at baseline.  Skin:  Small breakdown to right hip, redness and tenderness to sacrum.  GU:  Patient has foley in place per son for UTI? Neuro:  Patient does have history of Dementia, recognizes family member, and can states his name and  date of birth.  Vascular:   1-2+ pitting edema to BLE.      Patient has history of difficulty swallowing after a previous stroke, son reports had therapy to help with swallowing reports recently after eating has noticed patient clearing his throat.   R Recommendations: See Admitting Provider Note  Report given to:   Additional Notes:

## 2023-10-04 ENCOUNTER — Inpatient Hospital Stay (HOSPITAL_COMMUNITY): Payer: Medicare Other

## 2023-10-04 DIAGNOSIS — I10 Essential (primary) hypertension: Secondary | ICD-10-CM | POA: Diagnosis not present

## 2023-10-04 DIAGNOSIS — I480 Paroxysmal atrial fibrillation: Secondary | ICD-10-CM

## 2023-10-04 DIAGNOSIS — L8990 Pressure ulcer of unspecified site, unspecified stage: Secondary | ICD-10-CM

## 2023-10-04 DIAGNOSIS — J189 Pneumonia, unspecified organism: Secondary | ICD-10-CM | POA: Diagnosis not present

## 2023-10-04 DIAGNOSIS — F0390 Unspecified dementia without behavioral disturbance: Secondary | ICD-10-CM

## 2023-10-04 DIAGNOSIS — D649 Anemia, unspecified: Secondary | ICD-10-CM

## 2023-10-04 DIAGNOSIS — R0609 Other forms of dyspnea: Secondary | ICD-10-CM

## 2023-10-04 DIAGNOSIS — Z978 Presence of other specified devices: Secondary | ICD-10-CM

## 2023-10-04 DIAGNOSIS — E039 Hypothyroidism, unspecified: Secondary | ICD-10-CM

## 2023-10-04 DIAGNOSIS — N1831 Chronic kidney disease, stage 3a: Secondary | ICD-10-CM

## 2023-10-04 DIAGNOSIS — I5033 Acute on chronic diastolic (congestive) heart failure: Secondary | ICD-10-CM | POA: Diagnosis not present

## 2023-10-04 LAB — BASIC METABOLIC PANEL
Anion gap: 11 (ref 5–15)
BUN: 21 mg/dL (ref 8–23)
CO2: 30 mmol/L (ref 22–32)
Calcium: 8.7 mg/dL — ABNORMAL LOW (ref 8.9–10.3)
Chloride: 96 mmol/L — ABNORMAL LOW (ref 98–111)
Creatinine, Ser: 1.51 mg/dL — ABNORMAL HIGH (ref 0.61–1.24)
GFR, Estimated: 42 mL/min — ABNORMAL LOW (ref >60)
Glucose, Bld: 100 mg/dL — ABNORMAL HIGH (ref 70–99)
Potassium: 3.9 mmol/L (ref 3.5–5.1)
Sodium: 137 mmol/L (ref 135–145)

## 2023-10-04 LAB — ECHOCARDIOGRAM COMPLETE
AR max vel: 1.66 cm2
AV Area VTI: 1.84 cm2
AV Area mean vel: 1.61 cm2
AV Mean grad: 9 mm[Hg]
AV Peak grad: 17.5 mm[Hg]
Ao pk vel: 2.09 m/s
Area-P 1/2: 3.85 cm2
Height: 69 in
P 1/2 time: 416 ms
S' Lateral: 3.1 cm
Weight: 2885.38 [oz_av]

## 2023-10-04 LAB — CBC
HCT: 28.8 % — ABNORMAL LOW (ref 39.0–52.0)
Hemoglobin: 9.5 g/dL — ABNORMAL LOW (ref 13.0–17.0)
MCH: 30.6 pg (ref 26.0–34.0)
MCHC: 33 g/dL (ref 30.0–36.0)
MCV: 92.9 fL (ref 80.0–100.0)
Platelets: 100 10*3/uL — ABNORMAL LOW (ref 150–400)
RBC: 3.1 MIL/uL — ABNORMAL LOW (ref 4.22–5.81)
RDW: 16.6 % — ABNORMAL HIGH (ref 11.5–15.5)
WBC: 7.7 10*3/uL (ref 4.0–10.5)
nRBC: 0 % (ref 0.0–0.2)

## 2023-10-04 LAB — MAGNESIUM: Magnesium: 2.1 mg/dL (ref 1.7–2.4)

## 2023-10-04 LAB — PROCALCITONIN: Procalcitonin: 0.17 ng/mL

## 2023-10-04 MED ORDER — LORATADINE 10 MG PO TABS
10.0000 mg | ORAL_TABLET | Freq: Every day | ORAL | Status: DC
  Administered 2023-10-04 – 2023-10-06 (×3): 10 mg via ORAL
  Filled 2023-10-04 (×3): qty 1

## 2023-10-04 MED ORDER — ADULT MULTIVITAMIN W/MINERALS CH
1.0000 | ORAL_TABLET | Freq: Every day | ORAL | Status: DC
  Administered 2023-10-04 – 2023-10-05 (×2): 1 via ORAL
  Filled 2023-10-04 (×3): qty 1

## 2023-10-04 MED ORDER — CHLORHEXIDINE GLUCONATE CLOTH 2 % EX PADS
6.0000 | MEDICATED_PAD | Freq: Every day | CUTANEOUS | Status: DC
  Administered 2023-10-04 – 2023-10-06 (×3): 6 via TOPICAL

## 2023-10-04 MED ORDER — TOPIRAMATE 25 MG PO TABS
25.0000 mg | ORAL_TABLET | Freq: Every day | ORAL | Status: DC
  Administered 2023-10-04 – 2023-10-06 (×3): 25 mg via ORAL
  Filled 2023-10-04 (×3): qty 1

## 2023-10-04 MED ORDER — OCUVITE-LUTEIN PO CAPS
1.0000 | ORAL_CAPSULE | Freq: Every day | ORAL | Status: DC
  Filled 2023-10-04 (×2): qty 1

## 2023-10-04 MED ORDER — ORAL CARE MOUTH RINSE: 15.0000 mL | OROMUCOSAL | Status: DC | PRN

## 2023-10-04 MED ORDER — SODIUM CHLORIDE 0.9 % IV SOLN
1.5000 g | Freq: Two times a day (BID) | INTRAVENOUS | Status: DC
  Administered 2023-10-04 – 2023-10-06 (×5): 1.5 g via INTRAVENOUS
  Filled 2023-10-04 (×6): qty 4

## 2023-10-04 NOTE — Assessment & Plan Note (Signed)
Continue blood pressure monitoring, currently off antihypertensive agents.  He required midodrine on last  hospitalization.

## 2023-10-04 NOTE — Assessment & Plan Note (Signed)
Continue with levothyroxine  

## 2023-10-04 NOTE — Assessment & Plan Note (Addendum)
Acute hypoxemic respiratory failure.  Aspiration pneumonia, bilateral lower lobes and left upper lobe (present on admission).   02 saturation today 97 to 95% on 3 L.min per [place].   Plan to continue supplemental 02 per  [place] to keep 02 saturation 92% or greater.  Continue antibiotic therapy with Unasyn IV Bronchodilator therapy, and airway clearing techniques with flutter valve and incentive spirometer.  PT and OT Dysphagia 3 diet per speech recommendations.

## 2023-10-04 NOTE — Assessment & Plan Note (Signed)
Foley care per protocol ?

## 2023-10-04 NOTE — Assessment & Plan Note (Signed)
Continue anticoagulation with apixaban.  He is not on rate controlling agents, telemetry with ventricular paced rhythm.

## 2023-10-04 NOTE — Progress Notes (Addendum)
Progress Note   Patient: Douglas Walker ZOX:[id number] DOB: [birth year] DOA: 10/03/2023     1 DOS: the patient was seen and examined on 10/04/2023   Brief hospital course: Mr. HUMZAH FILLA was admitted to the hospital with the working diagnosis of acute hypoxemic respiratory failure due to pneumonia and heart failure decompensation.    89 y.o. male with medical history significant for PAF, heart failure, tachy-brady syndrome s/p PPM, CKD stage IIIa, chronic anemia and thrombocytopenia, HTN, HLD, hypothyroidism, essential tremor, recurrent UTI, BPH with chronic indwelling Foley catheter, and dementia who presented with altered mental status. He was noted to have worsening dyspnea for the last few days prior to admission, positive productive cough, congestion and low grade fevers. Worsening confusion compared to his baseline. On the day of admission home health nurse found patient hypoxemic at 84% on room air, EMS was called and patient was transported to the ED.  Recent hospitalization 07/31 to 08/12/23 for septic shock due to community acquired pneumonia and recurrent UTI. He was discharged to SNF, from where he was discharged home on 09/11/23.   In the ED his blood pressure was 149/70, HR 70, RR 24, 02 saturation 95% on 3 L/min per [place], lungs with bibasilar rales, with no wheezing, heart with S1 and S2 present and regular, no gallops, rubs or murmurs, abdomen with no distention and no lower extremity edema.   Na 134, K 4,2 Cl 96, bicarbonate 25, glucose 124, bun 23, cr 1,62  AST 30, ALT 26  BNP 526  Lactic acid 1,4 Wbc 10,9 hgb 9,6 plt 106   Chest radiograph with cardiomegaly, bilateral patchy interstitial infiltrates at bases more dense at the left side, with less intense at the left upper lobe.   EKG 70 bpm, normal axis, qtc 460, left bundle branch, ventricular paced rhythm, with underlying atrial fibrillation, no significant ST segment changes, no significant T wave changes.    Patient was placed on antibiotic therapy and diuretics.    Assessment and Plan: * Multifocal pneumonia Acute hypoxemic respiratory failure.  Aspiration pneumonia, bilateral lower lobes and left upper lobe (present on admission).   02 saturation today 97 to 95% on 3 L.min per [place].   Plan to continue supplemental 02 per [place] to keep 02 saturation 92% or greater.  Continue antibiotic therapy with Unasyn IV Bronchodilator therapy, and airway clearing techniques with flutter valve and incentive spirometer.  PT and OT Dysphagia 3 diet per speech recommendations.   Acute on chronic heart failure with preserved ejection fraction (HFpEF) (HCC) Echocardiogram from 2022 with preserved LV systolic function with EF 50 to 55%, RV systolic function with mild reduction, LA and RA with moderate dilatation, dilatated ascending aorta 47 mm. RVSP 45.5   Exacerbation has resolved. No clinical sings of volume overload today. Hold on loop diuretic therapy. Check new echocardiogram, for further work up, patient had recent hospitalization with septic shock.   Paroxysmal atrial fibrillation (HCC) Continue anticoagulation with apixaban.  He is not on rate controlling agents, telemetry with ventricular paced rhythm.    Essential hypertension Continue blood pressure monitoring, currently off antihypertensive agents.  He required midodrine on last  hospitalization.   Stage 3a chronic kidney disease (CKD) (HCC) AKI, hyponatremia.  Renal function with serum cr at 1,51 with K at 3,9 and serum bicarbonate at 30., Na 137. Mg 2.1   Hold on loop diuretic therapy. Close follow up renal function and electrolytes.   Chronic indwelling Foley catheter Foley care per protocol.  Hypothyroidism Continue with levothyroxine   Dementia (HCC) No agitation, continue neuro checks per unit protocol. Resume topamax, per his son he was placed back on this medication, but he was not sure about the dose. Will resume at  a low dose of 25 mg.  PT and OT for mobility.   Pressure ulcer {Tip this will not be part of the note when signed Body mass index is 26.63 kg/m. , ,  Active Pressure Injury/Wound(s)     Pressure Ulcer  Duration          , 66 days    66 days   Pressure Injury 10/03/23 Vertebral column Lower Deep Tissue Pressure Injury - Purple or maroon localized area of discolored intact skin or blood-filled blister due to damage of underlying soft tissue from pressure and/or shear. Area of purplish discolora <1 day             Subjective: Patient with improvement in dyspnea, no chest pain, tolerating po well.   Physical Exam: Vitals:   10/04/23 0430 10/04/23 0500 10/04/23 0906 10/04/23 1214  BP: 127/71  130/79 122/75  Pulse: 69 70 70 68  Resp: 15 16 15 18   Temp: (!) 97.3 F (36.3 C)  97.6 F (36.4 C) 98.2 F (36.8 C)  TempSrc: Oral  Oral Oral  SpO2: 95% 95% 93% 97%  Weight:  81.8 kg    Height: 5\' 9"  (1.753 m)      Neurology awake and alert, positive confusion but not agitation, mild disorientated ENT with mild pallor Cardiovascular with S1 and S2 present and regular, positive systolic murmur at the base, with no gallops or rubs Respiratory with scattered rales with no wheezing or rhonchi, mainly anterior auscultation, patient very weak not able to cooperate with examination.  Abdomen with no distention No lower extremity edema  Data Reviewed:    Family Communication: no family at the bedside.  I spoke with patient's son over the phone, we talked in detail about patient's condition, plan of care and prognosis and all questions were addressed.   Disposition: Status is: Inpatient Remains inpatient appropriate because: IV antibiotics   Planned Discharge Destination: home      Author: Coralie Keens, MD 10/04/2023 2:41 PM  For on call review [email or web address].

## 2023-10-04 NOTE — Assessment & Plan Note (Signed)
AKI, hyponatremia.  Today renal function with serum cr at 1,53 with K at 3,7 and serum bicarbonate at 31. Na 137.  Patient tolerating po well.   Close follow up renal function and electrolytes.

## 2023-10-04 NOTE — Assessment & Plan Note (Signed)
{  Tip this will not be part of the note when signed Body mass index is 26.63 kg/m. , ,  Active Pressure Injury/Wound(s)     Pressure Ulcer  Duration          , 66 days    66 days   Pressure Injury 10/03/23 Vertebral column Lower Deep Tissue Pressure Injury - Purple or maroon localized area of discolored intact skin or blood-filled blister due to damage of underlying soft tissue from pressure and/or shear. Area of purplish discolora <1 day

## 2023-10-04 NOTE — Progress Notes (Signed)
  Echocardiogram 2D Echocardiogram has been performed.  Douglas Walker 10/04/2023, 5:14 PM

## 2023-10-04 NOTE — Assessment & Plan Note (Addendum)
No agitation, continue neuro checks per unit protocol. Resume topamax, per his son he was placed back on this medication, but he was not sure about the dose. Will resume at a low dose of 25 mg.  PT and OT for mobility.

## 2023-10-04 NOTE — Progress Notes (Addendum)
Pharmacy Antibiotic Note  Douglas Walker is a 89 y.o. male admitted on 10/03/2023 with aspiration pneumonia.  Pharmacy has been consulted for ampicillin/sulbactam dosing.  ClCr 28 ml/min   Plan: Ampicillin/sulbactam 1.5g q12hr Monitor cultures, clinical status, renal function  Narrow abx as able and f/u duration    Height: 5\' 9"  (175.3 cm) Weight: 81.8 kg (180 lb 5.4 oz) IBW/kg (Calculated) : 70.7  Temp (24hrs), Avg:98.1 F (36.7 C), Min:97.3 F (36.3 C), Max:99.2 F (37.3 C)  Recent Labs  Lab 10/03/23 1430 10/03/23 1518 10/04/23 0303  WBC 10.9*  --  7.7  CREATININE 1.62*  --  1.51*  LATICACIDVEN  --  1.4  --     Estimated Creatinine Clearance: 28.6 mL/min (A) (by C-G formula based on SCr of 1.51 mg/dL (H)).    Allergies  Allergen Reactions   Sulfa Antibiotics Other (See Comments)    Weakness Dizziness  Sweats    Mysoline [Primidone] Other (See Comments)    Sedation    Zocor [Simvastatin] Other (See Comments)    Arthralgias Fatigue    Antimicrobials this admission: Azithro 10/4 CTX 10/4 Amp/sulb 10/5 >>     Microbiology results: 10/4 BCx: ngtd 10/4 UCx: ngtd   Thank you for allowing pharmacy to be a part of this patient's care.  Alphia Moh, PharmD, BCPS, BCCP Clinical Pharmacist  Please check AMION for all [facility] Pharmacy phone numbers After 10:00 PM, call Main Pharmacy [phone]

## 2023-10-04 NOTE — Assessment & Plan Note (Signed)
Echocardiogram with preserved LV systolic function with EF 50 to 55%, mild LVH, LA and RA with mild dilatation, mild aortic valve stenosis, severe dilatation of the ascending aorta 49 mm.   Patient was placed on IV furosemide for short course with improvement in volume status.  At the time of his discharge his volume balance is negative, -6,620 ml.   Plan to continue medical therapy with oral furosemide 40 mg daily.  Patient not candidate for SGLT 2 inh due to risk of urinary tract infection in the setting of chronic indwelling foley catheter. Holding on RAAS inhibition, until renal function more stable, to re assess as outpatient.

## 2023-10-04 NOTE — Evaluation (Signed)
Clinical/Bedside Swallow Evaluation Patient Details  Name: Douglas Walker MRN: [id number] Date of Birth: [birth year]  Today's Date: 10/04/2023 Time: SLP Start Time (ACUTE ONLY): 4098 SLP Stop Time (ACUTE ONLY): 0945 SLP Time Calculation (min) (ACUTE ONLY): 20 min  Past Medical History:  Past Medical History:  Diagnosis Date   Anemia    Arthritis    shoulders and back   Cancer (HCC)    skin cancers   Chronic diastolic (congestive) heart failure (HCC)    Chronic low back pain 12/15/2017   CKD (chronic kidney disease) stage 3, GFR 30-59 ml/min (HCC)    Essential tremor 02/28/2016   GERD (gastroesophageal reflux disease)    Hernia    History of hiatal hernia    Hyperlipemia    Hypertension    Macular degeneration    Neuromuscular disorder (HCC)    neuropathy   Paroxysmal atrial fibrillation (HCC) 10/28/2013   Presence of permanent cardiac pacemaker 12/04/2018   Pulmonary hypertension (HCC)    Stroke (HCC) 12/2020   Tachycardia-bradycardia syndrome (HCC) 09/13/2014   Thoracic aortic aneurysm (HCC)    Thrombocytopenia (HCC)    TIA (transient ischemic attack)    Past Surgical History:  Past Surgical History:  Procedure Laterality Date   APPENDECTOMY     EYE SURGERY     HERNIA REPAIR     HERNIA REPAIR     INSERT / REPLACE / REMOVE PACEMAKER  12/04/2018   IRRIGATION AND DEBRIDEMENT ABSCESS Right 07/24/2020   Procedure: IRRIGATION AND DEBRIDEMENT HEMATOMA;  Surgeon: Abigail Miyamoto, MD;  Location: [facility] OR;  Service: General;  Laterality: Right;   MASS EXCISION Right 07/20/2020   Procedure: EXCISION OF RIGHT CHEST WALL MASS;  Surgeon: Almond Lint, MD;  Location: [facility] OR;  Service: General;  Laterality: Right;   PACEMAKER IMPLANT N/A 12/04/2018   Procedure: PACEMAKER IMPLANT;  Surgeon: Marinus Maw, MD;  Location: [facility] INVASIVE CV LAB;  Service: Cardiovascular;  Laterality: N/A;   HPI:  Patient is a 89 y.o. male with PMH: dementia, HOH (bilateral HA's), GERD, essential  tremor, recurrent UTI, hiatal hernia, HTN, HLD, macular degeneration, CVA, TIA. He presented to the hospital on 10/03/23 for evaluation of AMS and hypoxia. At baseline, he lives at home with his son and requires supplemental oxygen. Several days preceding admission, patient was having increased SOB, frequent cough of greenish/brown sputum and low grade fevers. On day of admission, his home health RN had checked his vitals and found him to be hypoxic at 84% on room air. In ED, temp 99.1 F, SpO2 95% on 3 L O2 via [place]. Portable CXR showed Worsening interstitial opacities and patchy bibasilar opacities,  suspicious for pulmonary edema or multifocal pneumonia. He was started on Dys 1 (puree) solids, thin liquids diet and SLP ordered for swallow evaluation.    Assessment / Plan / Recommendation  Clinical Impression  Patient presents with clincal s/s of dysphagia as per this bedside swallow evaluation, however SLP suspects this to be his baseline as compared to previous swallow evaluations reports (bedside swallow evaluation in August of 2024 and MBS completed 2022). Patient is HOH but SLP was able to find replacement for battery of his left hearing aid which helped significantly with his ability to hear. Swallow was assesed with PO's of thin liquids (water) and regular solids (graham crackers). Patient able to self feed after SLP provided setup assistance. He exhibited mild, dry sounding cough after approximately 2 out of 8 straw sips of water. No change in voice or  vitals. He was able to masticate graham crackers without significant difficulty and with no oral residuals post swallows. SLP is recommending advance to Dys 3 (mechanical soft) solids and continue with thin liquids. SLP will follow to ensure toleration. SLP Visit Diagnosis: Dysphagia, oropharyngeal phase (R13.12)    Aspiration Risk  Mild aspiration risk    Diet Recommendation Dysphagia 3 (Mech soft);Thin liquid    Liquid Administration via:  Cup;Straw Medication Administration: Whole meds with puree Supervision: Patient able to self feed;Full supervision/cueing for compensatory strategies Compensations: Slow rate;Small sips/bites Postural Changes: Seated upright at 90 degrees    Other  Recommendations Oral Care Recommendations: Oral care BID    Recommendations for follow up therapy are one component of a multi-disciplinary discharge planning process, led by the attending physician.  Recommendations may be updated based on patient status, additional functional criteria and insurance authorization.  Follow up Recommendations Other (comment) (TBD)      Assistance Recommended at Discharge    Functional Status Assessment Patient has had a recent decline in their functional status and demonstrates the ability to make significant improvements in function in a reasonable and predictable amount of time.  Frequency and Duration min 2x/week  1 week       Prognosis Prognosis for improved oropharyngeal function: Good      Swallow Study   General Date of Onset: 10/03/23 HPI: Patient is a 89 y.o. male with PMH: dementia, HOH (bilateral HA's), GERD, essential tremor, recurrent UTI, hiatal hernia, HTN, HLD, macular degeneration, CVA, TIA. He presented to the hospital on 10/03/23 for evaluation of AMS and hypoxia. At baseline, he lives at home with his son and requires supplemental oxygen. Several days preceding admission, patient was having increased SOB, frequent cough of greenish/brown sputum and low grade fevers. On day of admission, his home health RN had checked his vitals and found him to be hypoxic at 84% on room air. In ED, temp 99.1 F, SpO2 95% on 3 L O2 via [place]. Portable CXR showed Worsening interstitial opacities and patchy bibasilar opacities,  suspicious for pulmonary edema or multifocal pneumonia. He was started on Dys 1 (puree) solids, thin liquids diet and SLP ordered for swallow evaluation. Type of Study: Bedside Swallow  Evaluation Previous Swallow Assessment: during previous admissions; has been on Dys 3 solids, thin liquids as per SLP evaluations in the past Diet Prior to this Study: Dysphagia 1 (pureed);Thin liquids (Level 0) Temperature Spikes Noted: No Respiratory Status: Nasal cannula History of Recent Intubation: No Behavior/Cognition: Alert;Cooperative;Pleasant mood Oral Cavity Assessment: Within Functional Limits Oral Care Completed by SLP: No Oral Cavity - Dentition: Dentures, top;Dentures, bottom Vision: Functional for self-feeding Self-Feeding Abilities: Able to feed self Patient Positioning: Upright in bed Baseline Vocal Quality: Normal;Low vocal intensity Volitional Cough: Strong Volitional Swallow: Able to elicit    Oral/Motor/Sensory Function Overall Oral Motor/Sensory Function: Within functional limits   Ice Chips     Thin Liquid Thin Liquid: Impaired Presentation: Straw;Self Fed Pharyngeal  Phase Impairments: Other (comments) Other Comments: subtle, dry cough observed 2 of 8 sips of water; consistent with prior MBS reporting penetration of thins    Nectar Thick     Honey Thick     Puree Puree: Not tested   Solid     Solid: Within functional limits Presentation: Self Fed      [name], MA, CCC-SLP Speech Therapy

## 2023-10-05 DIAGNOSIS — J189 Pneumonia, unspecified organism: Secondary | ICD-10-CM | POA: Diagnosis not present

## 2023-10-05 DIAGNOSIS — F03B0 Unspecified dementia, moderate, without behavioral disturbance, psychotic disturbance, mood disturbance, and anxiety: Secondary | ICD-10-CM

## 2023-10-05 DIAGNOSIS — I10 Essential (primary) hypertension: Secondary | ICD-10-CM | POA: Diagnosis not present

## 2023-10-05 DIAGNOSIS — I5033 Acute on chronic diastolic (congestive) heart failure: Secondary | ICD-10-CM | POA: Diagnosis not present

## 2023-10-05 LAB — CBC WITH DIFFERENTIAL/PLATELET
Abs Immature Granulocytes: 0.04 10*3/uL (ref 0.00–0.07)
Basophils Absolute: 0 10*3/uL (ref 0.0–0.1)
Basophils Relative: 1 %
Eosinophils Absolute: 0.1 10*3/uL (ref 0.0–0.5)
Eosinophils Relative: 2 %
HCT: 28.7 % — ABNORMAL LOW (ref 39.0–52.0)
Hemoglobin: 9.5 g/dL — ABNORMAL LOW (ref 13.0–17.0)
Immature Granulocytes: 1 %
Lymphocytes Relative: 18 %
Lymphs Abs: 1 10*3/uL (ref 0.7–4.0)
MCH: 30.7 pg (ref 26.0–34.0)
MCHC: 33.1 g/dL (ref 30.0–36.0)
MCV: 92.9 fL (ref 80.0–100.0)
Monocytes Absolute: 0.6 10*3/uL (ref 0.1–1.0)
Monocytes Relative: 11 %
Neutro Abs: 3.9 10*3/uL (ref 1.7–7.7)
Neutrophils Relative %: 67 %
Platelets: 101 10*3/uL — ABNORMAL LOW (ref 150–400)
RBC: 3.09 MIL/uL — ABNORMAL LOW (ref 4.22–5.81)
RDW: 16.3 % — ABNORMAL HIGH (ref 11.5–15.5)
WBC: 5.7 10*3/uL (ref 4.0–10.5)
nRBC: 0 % (ref 0.0–0.2)

## 2023-10-05 LAB — BASIC METABOLIC PANEL
Anion gap: 9 (ref 5–15)
BUN: 19 mg/dL (ref 8–23)
CO2: 31 mmol/L (ref 22–32)
Calcium: 8.7 mg/dL — ABNORMAL LOW (ref 8.9–10.3)
Chloride: 97 mmol/L — ABNORMAL LOW (ref 98–111)
Creatinine, Ser: 1.53 mg/dL — ABNORMAL HIGH (ref 0.61–1.24)
GFR, Estimated: 41 mL/min — ABNORMAL LOW (ref >60)
Glucose, Bld: 91 mg/dL (ref 70–99)
Potassium: 3.7 mmol/L (ref 3.5–5.1)
Sodium: 137 mmol/L (ref 135–145)

## 2023-10-05 MED ORDER — ORAL CARE MOUTH RINSE
15.0000 mL | OROMUCOSAL | Status: DC
  Administered 2023-10-05 – 2023-10-06 (×3): 15 mL via OROMUCOSAL

## 2023-10-05 MED ORDER — ORAL CARE MOUTH RINSE: 15.0000 mL | OROMUCOSAL | Status: DC | PRN

## 2023-10-05 MED ORDER — PROSIGHT PO TABS
1.0000 | ORAL_TABLET | Freq: Every day | ORAL | Status: DC
  Administered 2023-10-05 – 2023-10-06 (×2): 1 via ORAL
  Filled 2023-10-05 (×2): qty 1

## 2023-10-05 NOTE — Plan of Care (Signed)
  Problem: Activity: Goal: Ability to tolerate increased activity will improve Outcome: Progressing   Problem: Clinical Measurements: Goal: Ability to maintain a body temperature in the normal range will improve Outcome: Progressing   Problem: Respiratory: Goal: Ability to maintain adequate ventilation will improve Outcome: Progressing Goal: Ability to maintain a clear airway will improve Outcome: Progressing   

## 2023-10-05 NOTE — Assessment & Plan Note (Signed)
Hgb has been stable with hgb at 9,5 Chronic thrombocytopenia at 101

## 2023-10-05 NOTE — Progress Notes (Signed)
Progress Note   Patient: Douglas Walker ATF:[id number] DOB: 08-24-1925 DOA: 10/03/2023     2 DOS: the patient was seen and examined on 10/05/2023   Brief hospital course: [name] was admitted to the hospital with the working diagnosis of acute hypoxemic respiratory failure due to pneumonia and heart failure decompensation.    89 y.o. male with medical history significant for PAF, heart failure, tachy-brady syndrome s/p PPM, CKD stage IIIa, chronic anemia and thrombocytopenia, HTN, HLD, hypothyroidism, essential tremor, recurrent UTI, BPH with chronic indwelling Foley catheter, and dementia who presented with altered mental status. He was noted to have worsening dyspnea for the last few days prior to admission, positive productive cough, congestion and low grade fevers. Worsening confusion compared to his baseline. On the day of admission home health nurse found patient hypoxemic at 84% on room air, EMS was called and patient was transported to the ED.  Recent hospitalization 07/31 to 08/12/23 for septic shock due to community acquired pneumonia and recurrent UTI. He was discharged to SNF, from where he was discharged home on 09/11/23.   In the ED his blood pressure was 149/70, HR 70, RR 24, 02 saturation 95% on 3 L/min per [place], lungs with bibasilar rales, with no wheezing, heart with S1 and S2 present and regular, no gallops, rubs or murmurs, abdomen with no distention and no lower extremity edema.   Na 134, K 4,2 Cl 96, bicarbonate 25, glucose 124, bun 23, cr 1,62  AST 30, ALT 26  BNP 526  Lactic acid 1,4 Wbc 10,9 hgb 9,6 plt 106   Chest radiograph with cardiomegaly, bilateral patchy interstitial infiltrates at bases more dense at the left side, with less intense at the left upper lobe.   EKG 70 bpm, normal axis, qtc 460, left bundle branch, ventricular paced rhythm, with underlying atrial fibrillation, no significant ST segment changes, no significant T wave changes.    Patient was placed on antibiotic therapy and diuretics.   10/06 patient responding well to antibiotic therapy.    Assessment and Plan: * Multifocal pneumonia Acute hypoxemic respiratory failure.  Aspiration pneumonia, bilateral lower lobes and left upper lobe (present on admission).   02 saturation 98% on 3 L.min per [place].   Plan to continue supplemental 02 per [place] to keep 02 saturation 92% or greater.  Continue antibiotic therapy with Unasyn IV Bronchodilator therapy, and airway clearing techniques with flutter valve and incentive spirometer.  PT and OT Dysphagia 3 diet per speech recommendations.   Acute on chronic heart failure with preserved ejection fraction (HFpEF) (HCC) Echocardiogram with preserved LV systolic function with EF 50 to 55%, mild LVH, LA and RA with mild dilatation, mild aortic valve stenosis, severe dilatation of the ascending aorta 49 mm.   Exacerbation has resolved.  Continue to hold on diuretic therapy.   Paroxysmal atrial fibrillation (HCC) Continue anticoagulation with apixaban.  He is not on rate controlling agents, telemetry with ventricular paced rhythm.    Essential hypertension Continue blood pressure monitoring, currently off antihypertensive agents.  He required midodrine on last  hospitalization.   Stage 3a chronic kidney disease (CKD) (HCC) AKI, hyponatremia.  Today renal function with serum cr at 1,53 with K at 3,7 and serum bicarbonate at 31. Na 137.  Patient tolerating po well.   Close follow up renal function and electrolytes.   Chronic indwelling Foley catheter Foley care per protocol.   Hypothyroidism Continue with levothyroxine   Dementia (HCC) No agitation, continue neuro checks per  unit protocol. Continue with topamax, per his son he was placed back on this medication, but he was not sure about the dose. Will resume at a low dose of 25 mg.  PT and OT for mobility.   Normocytic anemia Hgb has been stable with hgb at  9,5 Chronic thrombocytopenia at 101   Pressure ulcer {Tip this will not be part of the note when signed Body mass index is 26.63 kg/m. , ,  Active Pressure Injury/Wound(s)     Pressure Ulcer  Duration          , 66 days    66 days   Pressure Injury 10/03/23 Vertebral column Lower Deep Tissue Pressure Injury - Purple or maroon localized area of discolored intact skin or blood-filled blister due to damage of underlying soft tissue from pressure and/or shear. Area of purplish discolora <1 day             Subjective: Patient is feeling better, dyspnea has improved, and tolerating po well.   Physical Exam: Vitals:   10/05/23 0308 10/05/23 0512 10/05/23 0800 10/05/23 1203  BP: (!) 153/76  (!) 140/75 (!) 146/75  Pulse: 70  69 71  Resp: 16  (!) 21 16  Temp:   98.5 F (36.9 C) 99.4 F (37.4 C)  TempSrc:   Oral Oral  SpO2: 96%  93% 98%  Weight:  77.9 kg    Height:       Neurology awake and alert ENT with mild pallor Cardiovascular with S1 and S2 present and regular with no gallops, or rubs, positive systolic murmur at the base. Respiratory with mid rales with no wheezing or rhonchi Abdomen with no distention  Data Reviewed:    Family Communication: no family at the bedside   Disposition: Status is: Inpatient Remains inpatient appropriate because: pneumonia   Planned Discharge Destination: Home     Author: Coralie Keens, MD 10/05/2023 1:37 PM  For on call review [email or web address].

## 2023-10-06 ENCOUNTER — Other Ambulatory Visit (HOSPITAL_COMMUNITY): Payer: Self-pay

## 2023-10-06 DIAGNOSIS — I10 Essential (primary) hypertension: Secondary | ICD-10-CM | POA: Diagnosis not present

## 2023-10-06 DIAGNOSIS — J189 Pneumonia, unspecified organism: Secondary | ICD-10-CM | POA: Diagnosis not present

## 2023-10-06 DIAGNOSIS — I480 Paroxysmal atrial fibrillation: Secondary | ICD-10-CM | POA: Diagnosis not present

## 2023-10-06 DIAGNOSIS — I5033 Acute on chronic diastolic (congestive) heart failure: Secondary | ICD-10-CM | POA: Diagnosis not present

## 2023-10-06 DIAGNOSIS — N390 Urinary tract infection, site not specified: Secondary | ICD-10-CM

## 2023-10-06 LAB — BASIC METABOLIC PANEL
Anion gap: 8 (ref 5–15)
BUN: 19 mg/dL (ref 8–23)
CO2: 30 mmol/L (ref 22–32)
Calcium: 8.7 mg/dL — ABNORMAL LOW (ref 8.9–10.3)
Chloride: 96 mmol/L — ABNORMAL LOW (ref 98–111)
Creatinine, Ser: 1.48 mg/dL — ABNORMAL HIGH (ref 0.61–1.24)
GFR, Estimated: 43 mL/min — ABNORMAL LOW (ref >60)
Glucose, Bld: 90 mg/dL (ref 70–99)
Potassium: 3.7 mmol/L (ref 3.5–5.1)
Sodium: 134 mmol/L — ABNORMAL LOW (ref 135–145)

## 2023-10-06 LAB — URINE CULTURE

## 2023-10-06 LAB — LEGIONELLA PNEUMOPHILA SEROGP 1 UR AG: L. pneumophila Serogp 1 Ur Ag: NEGATIVE

## 2023-10-06 MED ORDER — AMOXICILLIN-POT CLAVULANATE 500-125 MG PO TABS
1.0000 | ORAL_TABLET | Freq: Two times a day (BID) | ORAL | Status: DC
  Administered 2023-10-06: 1 via ORAL
  Filled 2023-10-06 (×2): qty 1

## 2023-10-06 MED ORDER — AMOXICILLIN-POT CLAVULANATE 500-125 MG PO TABS
1.0000 | ORAL_TABLET | Freq: Two times a day (BID) | ORAL | 0 refills | Status: DC
  Filled 2023-10-06: qty 10, 5d supply, fill #0

## 2023-10-06 MED ORDER — AMOXICILLIN-POT CLAVULANATE 875-125 MG PO TABS: 1.0000 | ORAL_TABLET | Freq: Two times a day (BID) | ORAL | Status: DC

## 2023-10-06 NOTE — Progress Notes (Signed)
Mobility Specialist Progress Note:    10/06/23 1414  Mobility  Activity Ambulated with assistance in hallway  Level of Assistance Minimal assist, patient does 75% or more  Assistive Device Front wheel walker  Distance Ambulated (ft) 250 ft  Activity Response Tolerated well  Mobility Referral Yes  $Mobility charge 1 Mobility  Mobility Specialist Start Time (ACUTE ONLY) 1130  Mobility Specialist Stop Time (ACUTE ONLY) 1147  Mobility Specialist Time Calculation (min) (ACUTE ONLY) 17 min   Received pt in bed having no complaints and agreeable to mobility. Pt was asymptomatic throughout ambulation and returned to room w/o fault. Left in chair w/ call bell in reach and all needs met.   Thompson Grayer Mobility Specialist  Please contact vis Secure Chat or  Rehab Office [phone]

## 2023-10-06 NOTE — Consult Note (Signed)
WOC Nurse Consult Note: Reason for Consult: sacrum, hip and lip wound Wound type: Unstageable pressure injury: right trochanter; 95% yellow/5% pink; cleanse with saline, pat dry. Apply Santyl and dry dressing. Change daily Stage 3 Pressure Injury: sacrum; full thickness, 100% pink and clean. Silicone foam dressings to the  sacrum,           change every 3 days. ASSESS UNDER dressings each shift for any acute changes in the wounds.   Pressure Injury POA: Yes Measurement: Right trochanter: 1cm x 1cm x 0.2cm  Sacrum; two small open areas; 0.4cm x 0.4cm x 0.2cm each Wound bed: see above  Drainage (amount, consistency, odor) scant, non purulent Periwound:intact  Dressing procedure/placement/frequency: See above   Discussed POC with patient and hostpitalist bedside nurse.  Re consult if needed, will not follow at this time. Thanks  Wealthy Danielski [employer], RN,CWOCN, CNS, CWON-AP [phone])

## 2023-10-06 NOTE — Evaluation (Signed)
Occupational Therapy Evaluation Patient Details Name: Douglas Walker MRN: [id number] DOB: 1925/08/20 Today's Date: 10/06/2023   History of Present Illness Pt is a 89 y/o M presenting to ED On 10/4 with AMS and tremors, found to be hypoxic on RA (84%) by [facility]. Recently d/c'd from [facility] with sepsis/UTI/PNA on 09/10/2023. Admitted for multifocal PNA. PMH includes dementia, PAF on Eliquis, HFpEF (EF 50-55%), tachy-brady syndrome s/p PPM, CKD IIIA, chronic anemia and thromocytopenia, HTN, HLD, hypothyroidism, essential tremor, recurrent UTI.   Clinical Impression   Pt reports living with son and daughter-in-law, is typically using RW for mobility and has set up A for ADLs, son does IADLs. Pt with decr vision, currently needing set up -mod A for ADLs, and CGA-min A for tranfers with RW.  SpO2 mid 80's-90's on RA with hallway distance ambulation, cues for PLB. Pt presenting with impairments listed below, will follow acutely. Recommend HHOT at d/c given pt has assist/supervision from family at d/c.       If plan is discharge home, recommend the following: A little help with walking and/or transfers;A lot of help with bathing/dressing/bathroom;Assistance with cooking/housework;Direct supervision/assist for medications management;Direct supervision/assist for financial management;Assist for transportation;Help with stairs or ramp for entrance;Supervision due to cognitive status    Functional Status Assessment  Patient has had a recent decline in their functional status and demonstrates the ability to make significant improvements in function in a reasonable and predictable amount of time.  Equipment Recommendations  None recommended by OT (pt has all needed DME)    Recommendations for Other Services PT consult     Precautions / Restrictions Precautions Precautions: Fall Precaution Comments: watch O2 Restrictions Weight Bearing Restrictions: No      Mobility Bed Mobility                General bed mobility comments: OOB in chair upon arrival and departure    Transfers Overall transfer level: Needs assistance Equipment used: Rolling walker (2 wheels) Transfers: Sit to/from Stand Sit to Stand: Contact guard assist, Min assist                  Balance Overall balance assessment: Needs assistance Sitting-balance support: Feet supported Sitting balance-Leahy Scale: Good     Standing balance support: During functional activity, Reliant on assistive device for balance Standing balance-Leahy Scale: Poor Standing balance comment: reliant on RW                           ADL either performed or assessed with clinical judgement   ADL Overall ADL's : Needs assistance/impaired Eating/Feeding: Set up   Grooming: Contact guard assist   Upper Body Bathing: Minimal assistance   Lower Body Bathing: Moderate assistance   Upper Body Dressing : Minimal assistance   Lower Body Dressing: Moderate assistance   Toilet Transfer: Minimal assistance;Contact guard assist;Ambulation;Rolling walker (2 wheels)   Toileting- Clothing Manipulation and Hygiene: Total assistance Toileting - Clothing Manipulation Details (indicate cue type and reason): chronic foley     Functional mobility during ADLs: Contact guard assist;Minimal assistance;Rolling walker (2 wheels)       Vision Baseline Vision/History: 6 Macular Degeneration Patient Visual Report: No change from baseline Additional Comments: R eye blind at baseline, L eye with macular degeneration     Perception Perception: Not tested       Praxis Praxis: Not tested       Pertinent Vitals/Pain Pain Assessment Pain Assessment: Faces Pain Score:  4  Faces Pain Scale: Hurts little more Pain Location: back, buttocks, knee Pain Descriptors / Indicators: Sore, Discomfort, Grimacing Pain Intervention(s): Limited activity within patient's tolerance     Extremity/Trunk Assessment Upper Extremity  Assessment Upper Extremity Assessment: Overall WFL for tasks assessed   Lower Extremity Assessment Lower Extremity Assessment: Defer to PT evaluation   Cervical / Trunk Assessment Cervical / Trunk Assessment: Normal   Communication Communication Communication: Hearing impairment Cueing Techniques: Verbal cues;Gestural cues   Cognition Arousal: Alert Behavior During Therapy: WFL for tasks assessed/performed Overall Cognitive Status: History of cognitive impairments - at baseline                                 General Comments: hx of dementia, following commands with incr time/repetition of instructions     General Comments  SpO2 mid 80's-mid 90's on RA,  increases quickly with cues for PLB    Exercises     Shoulder Instructions      Home Living Family/patient expects to be discharged to:: Private residence Living Arrangements: Children Available Help at Discharge: Available 24 hours/day Type of Home: House Home Access: Level entry     Home Layout: One level     Bathroom Shower/Tub: Producer, television/film/video: Handicapped height     Home Equipment: Agricultural consultant (2 wheels);Grab bars - tub/shower;Shower seat   Additional Comments: lives with son, DIL, grandson.  Family stating patient will accept any and all help offered to him.      Prior Functioning/Environment Prior Level of Function : Independent/Modified Independent;History of Falls (last six months)             Mobility Comments: Used RW at [employer] I ADLs Comments: son assists with medications, and lays out his clothes        OT Problem List: Decreased strength;Decreased range of motion;Decreased activity tolerance;Impaired balance (sitting and/or standing);Decreased cognition;Decreased coordination;Impaired vision/perception;Decreased safety awareness;Cardiopulmonary status limiting activity      OT Treatment/Interventions: Self-care/ADL training;Therapeutic exercise;Energy  conservation;DME and/or AE instruction;Therapeutic activities;Balance training;Patient/family education;Cognitive remediation/compensation;Visual/perceptual remediation/compensation    OT Goals(Current goals can be found in the care plan section) Acute Rehab OT Goals Patient Stated Goal: none stated OT Goal Formulation: With patient Time For Goal Achievement: 10/20/23 Potential to Achieve Goals: Good ADL Goals Pt Will Perform Upper Body Dressing: with supervision;sitting Pt Will Perform Lower Body Dressing: with supervision;sitting/lateral leans;sit to/from stand Pt Will Transfer to Toilet: with supervision;ambulating;regular height toilet Additional ADL Goal #1: pt will verbalize x3 low vision strategies in order to promote safety/ind with ADLs  OT Frequency: Min 1X/week    Co-evaluation PT/OT/SLP Co-Evaluation/Treatment: Yes Reason for Co-Treatment: Other (comment) (imminent dc)   OT goals addressed during session: ADL's and self-care;Strengthening/ROM      AM-PAC OT "6 Clicks" Daily Activity     Outcome Measure Help from another person eating meals?: A Little Help from another person taking care of personal grooming?: A Little Help from another person toileting, which includes using toliet, bedpan, or urinal?: Total Help from another person bathing (including washing, rinsing, drying)?: A Lot Help from another person to put on and taking off regular upper body clothing?: A Little Help from another person to put on and taking off regular lower body clothing?: A Lot 6 Click Score: 14   End of Session Equipment Utilized During Treatment: Rolling walker (2 wheels);Gait belt Nurse Communication: Mobility status  Activity Tolerance: Patient tolerated treatment  well Patient left: in chair;with call bell/phone within reach;with chair alarm set  OT Visit Diagnosis: Unsteadiness on feet (R26.81);Other abnormalities of gait and mobility (R26.89);Muscle weakness (generalized)  (M62.81);History of falling (Z91.81);Low vision, both eyes (H54.2)                Time: 5732-2025 OT Time Calculation (min): 28 min Charges:  OT General Charges $OT Visit: 1 Visit OT Evaluation $OT Eval Moderate Complexity: 1 Mod  Yolani Vo K, OTD, OTR/L SecureChat Preferred Acute Rehab [phone]   Amorita Vanrossum K Koonce 10/06/2023, 2:16 PM

## 2023-10-06 NOTE — TOC Transition Note (Addendum)
Transition of Care [facility]) - CM/SW Discharge Note   Patient Details  Name: Douglas Walker MRN: [id number] Date of Birth: 12-20-1925  Transition of Care [facility]) CM/SW Contact:  Leone Haven, RN Phone Number: 10/06/2023, 2:18 PM   Clinical Narrative:    Patient is for dc today, NCM notified his son, and notified [facility].  Son will be here to transport him home. Son states they have worked with [employer] before and would like them for outpatient palliative services.      Barriers to Discharge: Continued Medical Work up   Patient Goals and CMS Choice CMS Medicare.gov Compare Post Acute Care list provided to:: Patient Represenative (must comment) Choice offered to / list presented to : Adult Children  Discharge Placement                         Discharge Plan and Services Additional resources added to the After Visit Summary for   In-house Referral: NA Discharge Planning Services: CM Consult Post Acute Care Choice: Home Health          DME Arranged: N/A DME Agency: NA       HH Arranged: NA          Social Determinants of Health (SDOH) Interventions SDOH Screenings   Food Insecurity: No Food Insecurity (10/03/2023)  Housing: Low Risk  (10/03/2023)  Transportation Needs: No Transportation Needs (10/03/2023)  Utilities: Not At Risk (10/03/2023)  Depression (PHQ2-9): Low Risk  (07/29/2023)  Tobacco Use: Medium Risk (10/03/2023)     Readmission Risk Interventions    10/06/2023   12:21 PM 08/04/2023    9:31 AM 10/15/2021   10:43 AM  Readmission Risk Prevention Plan  Transportation Screening Complete Complete Complete  PCP or Specialist Appt within 3-5 Days  Complete   HRI or Home Care Consult  Complete   Social Work Consult for Recovery Care Planning/Counseling  Complete   Palliative Care Screening  Complete   Medication Review Oceanographer) Complete Complete Complete  PCP or Specialist appointment within 3-5 days of discharge Complete  Complete   HRI or Home Care Consult Complete  Complete  SW Recovery Care/Counseling Consult   Complete  Palliative Care Screening   Not Applicable  Skilled Nursing Facility Not Applicable  Complete

## 2023-10-06 NOTE — Plan of Care (Signed)
  Problem: Activity: Goal: Ability to tolerate increased activity will improve Outcome: Adequate for Discharge   Problem: Clinical Measurements: Goal: Ability to maintain a body temperature in the normal range will improve Outcome: Adequate for Discharge   Problem: Respiratory: Goal: Ability to maintain adequate ventilation will improve Outcome: Adequate for Discharge Goal: Ability to maintain a clear airway will improve Outcome: Adequate for Discharge   Problem: Education: Goal: Knowledge of General Education information will improve Description: Including pain rating scale, medication(s)/side effects and non-pharmacologic comfort measures Outcome: Adequate for Discharge   Problem: Health Behavior/Discharge Planning: Goal: Ability to manage health-related needs will improve Outcome: Adequate for Discharge   Problem: Clinical Measurements: Goal: Ability to maintain clinical measurements within normal limits will improve Outcome: Adequate for Discharge Goal: Will remain free from infection Outcome: Adequate for Discharge Goal: Diagnostic test results will improve Outcome: Adequate for Discharge Goal: Respiratory complications will improve Outcome: Adequate for Discharge Goal: Cardiovascular complication will be avoided Outcome: Adequate for Discharge   Problem: Activity: Goal: Risk for activity intolerance will decrease Outcome: Adequate for Discharge   Problem: Nutrition: Goal: Adequate nutrition will be maintained Outcome: Adequate for Discharge   Problem: Coping: Goal: Level of anxiety will decrease Outcome: Adequate for Discharge   Problem: Elimination: Goal: Will not experience complications related to bowel motility Outcome: Adequate for Discharge Goal: Will not experience complications related to urinary retention Outcome: Adequate for Discharge   Problem: Pain Managment: Goal: General experience of comfort will improve Outcome: Adequate for Discharge    Problem: Safety: Goal: Ability to remain free from injury will improve Outcome: Adequate for Discharge   Problem: Skin Integrity: Goal: Risk for impaired skin integrity will decrease Outcome: Adequate for Discharge   Problem: SLP Dysphagia Goals Goal: Misc Dysphagia Goal Outcome: Adequate for Discharge   Problem: Acute Rehab OT Goals (only OT should resolve) Goal: Pt. Will Perform Upper Body Dressing Outcome: Adequate for Discharge Goal: Pt. Will Perform Lower Body Dressing Outcome: Adequate for Discharge Goal: Pt. Will Transfer To Toilet Outcome: Adequate for Discharge Goal: OT Additional ADL Goal #1 Outcome: Adequate for Discharge   Problem: Acute Rehab PT Goals(only PT should resolve) Goal: Pt Will Go Supine/Side To Sit Outcome: Adequate for Discharge Goal: Patient Will Transfer Sit To/From Stand Outcome: Adequate for Discharge Goal: Pt Will Ambulate Outcome: Adequate for Discharge Goal: Pt Will Go Up/Down Stairs Outcome: Adequate for Discharge Goal: Pt/caregiver will Perform Home Exercise Program Outcome: Adequate for Discharge

## 2023-10-06 NOTE — TOC Initial Note (Signed)
Transition of Care [facility]) - Initial/Assessment Note    Patient Details  Name: Douglas Walker MRN: [id number] Date of Birth: Jun 27, 1925  Transition of Care [facility]) CM/SW Contact:    Leone Haven, RN Phone Number: 10/06/2023, 12:25 PM  Clinical Narrative:                 Brett Canales is patient's POA, NDM spoke with Brett Canales, he states From home with him and his wife, has PCP and insurance on file, states he is active with [facility] for [facility], HHPT, HHOT and they would like to continue if appropriate..  States he  will transport patient home at [employer] and family is support system, states gets medications from [place] on [address].  Pta self ambulatory with walker.  Expected Discharge Plan: Home w Home Health Services Barriers to Discharge: Continued Medical Work up   Patient Goals and CMS Choice Patient states their goals for this hospitalization and ongoing recovery are:: return home CMS Medicare.gov Compare Post Acute Care list provided to:: Patient Represenative (must comment) Choice offered to / list presented to : Adult Children      Expected Discharge Plan and Services In-house Referral: NA Discharge Planning Services: CM Consult Post Acute Care Choice: Home Health Living arrangements for the past 2 months: Single Family Home                 DME Arranged: N/A DME Agency: NA       HH Arranged: NA          Prior Living Arrangements/Services Living arrangements for the past 2 months: Single Family Home Lives with:: Adult Children Patient language and need for interpreter reviewed:: Yes Do you feel safe going back to the place where you live?: Yes      Need for Family Participation in Patient Care: Yes (Comment) Care giver support system in place?: Yes (comment) Current home services: DME (he has all the DME he needs) Criminal Activity/Legal Involvement Pertinent to Current Situation/Hospitalization: No - Comment as needed  Activities of Daily Living   ADL  Screening (condition at time of admission) Independently performs ADLs?: Yes (appropriate for developmental age) Is the patient deaf or have difficulty hearing?: No Does the patient have difficulty seeing, even when wearing glasses/contacts?: Yes (Blind right eye right eye prothesis) Does the patient have difficulty concentrating, remembering, or making decisions?: No  Permission Sought/Granted Permission sought to share information with : Case Manager Permission granted to share information with : Yes, Verbal Permission Granted              Emotional Assessment       Orientation: : Oriented to Self, Oriented to Place, Oriented to  Time, Oriented to Situation Alcohol / Substance Use: Not Applicable Psych Involvement: No (comment)  Admission diagnosis:  Acute respiratory failure with hypoxia (HCC) [J96.01] Patient Active Problem List   Diagnosis Date Noted   Dementia (HCC) 10/04/2023   Pressure ulcer 10/04/2023   Acute respiratory failure with hypoxia (HCC) 10/03/2023   Multifocal pneumonia 10/03/2023   Acute on chronic heart failure with preserved ejection fraction (HFpEF) (HCC) 10/03/2023   Aneurysm of ascending aorta without rupture (HCC) 02/04/2023   Coronary artery calcification 02/04/2023   Aortic atherosclerosis (HCC) 02/04/2023   Hematuria 01/02/2023   FTT (failure to thrive) in adult 01/02/2023   Nausea and vomiting 10/13/2022   Normocytic anemia 10/13/2022   AAA (abdominal aortic aneurysm) (HCC) 10/13/2022   Physical deconditioning 08/29/2022   Eye pain, right 08/10/2022  Blind painful right eye 08/08/2022   Vitreous hemorrhage of right eye (HCC) 08/05/2022   Choroidal hemorrhage, right 08/05/2022   Secondary glaucoma due to combination mechanisms, right, moderate stage 08/05/2022   History of corneal transplant 05/16/2022   Chronic indwelling Foley catheter 01/31/2022   Benign prostatic hyperplasia with lower urinary tract symptoms 01/31/2022   Medication  monitoring encounter 01/31/2022   Multiple drug resistant organism (MDRO) culture positive 01/31/2022   Bacteremia due to Proteus species 10/11/2021   Acute kidney injury superimposed on chronic kidney disease (HCC) 07/06/2021   Pain in both testicles    Right hemiparesis (HCC) 04/23/2021   Cerebrovascular accident (CVA) due to embolism of left middle cerebral artery (HCC) 04/23/2021   Neuropathy 04/23/2021   Abnormality of gait 04/23/2021   Abnormal results of thyroid function studies 04/04/2021   Acquired iron deficiency anemia due to decreased absorption 04/04/2021   Acquired thrombophilia (HCC) 04/04/2021   Cerebral atherosclerosis 04/04/2021   Basal cell carcinoma of nose 04/04/2021   Benign hypertensive heart and renal disease, with heart and renal failure (HCC) 04/04/2021   Benign prostatic hyperplasia 04/04/2021   Obesity 04/04/2021   Closed fracture of one rib 04/04/2021   Constipation by delayed colonic transit 04/04/2021   Gastro-esophageal reflux disease without esophagitis 04/04/2021   Hypothyroidism 04/04/2021   Left lower quadrant pain 04/04/2021   Lumbar radiculopathy 04/04/2021   Reactive depression 04/04/2021   Mass of chest wall 04/04/2021   Mixed hyperlipidemia 04/04/2021   Nocturia 04/04/2021   Other shoulder lesions, right shoulder 04/04/2021   Pain in limb 04/04/2021   Peripheral edema 04/04/2021   Personal history of malignant neoplasm of bladder 04/04/2021   Pruritus of genitalia 04/04/2021   Tremor 04/04/2021   Unilateral primary osteoarthritis, left knee 04/04/2021   Unspecified mononeuropathy of unspecified lower limb 04/04/2021   Urinary incontinence 04/04/2021   Dysphagia, post-stroke    Chronic diastolic congestive heart failure (HCC)    Chronic pain syndrome    Left middle cerebral artery stroke (HCC) 02/01/2021   Pacemaker 08/30/2020   Hematoma, chest wall 07/24/2020   Exudative age-related macular degeneration of right eye with inactive  choroidal neovascularization (HCC) 05/10/2020   Exudative age-related macular degeneration of left eye with inactive choroidal neovascularization (HCC) 05/10/2020   Retinal hemorrhage of right eye 05/10/2020   Exposure keratopathy, bilateral 05/10/2020   Bilateral dry eyes 05/10/2020   Advanced nonexudative age-related macular degeneration of both eyes with subfoveal involvement 05/10/2020   Complete heart block (HCC) 12/04/2018   Chronic low back pain 12/15/2017   Hereditary and idiopathic peripheral neuropathy 07/01/2016   Essential tremor 02/28/2016   BPPV (benign paroxysmal positional vertigo) 06/19/2015   Stage 3a chronic kidney disease (CKD) (HCC) 06/18/2015   Chronic diastolic CHF (congestive heart failure) (HCC) 05/30/2015   Fall    Hypotension 05/19/2015   Fracture of rib of left side 05/19/2015   Mobitz type II atrioventricular block 09/13/2014   Essential hypertension 09/13/2014   Chronic anticoagulation 09/13/2014   SSS (sick sinus syndrome) (HCC) 09/13/2014   Paroxysmal atrial fibrillation (HCC) 10/28/2013   Thrombocytopenia (HCC) 04/06/2013   PCP:  Georgann Housekeeper, MD Pharmacy:   [facility] [address] [place], [place] - [address] [place] [address] Phone: [phone] Fax: [phone]     Social Determinants of Health (SDOH) Social History: SDOH Screenings   Food Insecurity: No Food Insecurity (10/03/2023)  Housing: Low Risk  (10/03/2023)  Transportation Needs: No Transportation Needs (10/03/2023)  Utilities: Not At Risk (10/03/2023)  Depression (PHQ2-9): Low Risk  (07/29/2023)  Tobacco Use: Medium Risk (10/03/2023)   SDOH Interventions:     Readmission Risk Interventions    10/06/2023   12:21 PM 08/04/2023    9:31 AM 10/15/2021   10:43 AM  Readmission Risk Prevention Plan  Transportation Screening Complete Complete Complete  PCP or Specialist Appt within 3-5 Days  Complete   HRI or Home Care Consult   Complete   Social Work Consult for Recovery Care Planning/Counseling  Complete   Palliative Care Screening  Complete   Medication Review Oceanographer) Complete Complete Complete  PCP or Specialist appointment within 3-5 days of discharge Complete  Complete  HRI or Home Care Consult Complete  Complete  SW Recovery Care/Counseling Consult   Complete  Palliative Care Screening   Not Applicable  Skilled Nursing Facility Not Applicable  Complete

## 2023-10-06 NOTE — Discharge Summary (Addendum)
no gallops, rubs or murmurs Respiratory with no rales, wheezing or rhonchi Abdomen with no distention  No lower extremity edema.  Pressure ulcer not able to stage  right hip and stage 3 sacrum with no signs of local infection.         Condition at discharge: stable  The results of significant diagnostics from this hospitalization (including imaging, microbiology, ancillary and laboratory) are listed below for reference.   Imaging Studies: ECHOCARDIOGRAM COMPLETE  Result Date: 10/04/2023    ECHOCARDIOGRAM REPORT   Patient Name:   Douglas Walker Date of Exam: 10/04/2023 Medical Rec #:  [id number]          Height:       69.0 in Accession #:    [id number]         Weight:       180.3 lb Date of Birth:  1925-08-29          BSA:          1.977 m Patient Age:    89 years           BP:           122/75 mmHg Patient Gender: M                  HR:           70 bpm. Exam Location:  Inpatient Procedure: 2D Echo, Color Doppler and Cardiac Doppler Indications:    dyspnea  History:        Patient has prior history of Echocardiogram examinations, most                 recent 01/28/2021. Pacemaker, chronic kidney disease; Risk                 Factors:Hypertension.  Sonographer:    Delcie Roch RDCS Referring Phys: [id number] Leelyn Jasinski DANIEL Temika Sutphin IMPRESSIONS  1. Left ventricular ejection fraction, by estimation, is 50 to 55%. The left ventricle has low normal function. The left ventricle has no regional wall motion abnormalities. There is mild left ventricular hypertrophy. Left ventricular diastolic parameters are indeterminate.  2. Pacing wires in RA/RV . Right ventricular systolic function is normal. The right ventricular size is normal.  3. Left  atrial size was mildly dilated.  4. Right atrial size was mildly dilated.  5. The mitral valve is abnormal. Trivial mitral valve regurgitation.  6. The aortic valve is tricuspid. There is moderate calcification of the aortic valve. There is moderate thickening of the aortic valve. Aortic valve regurgitation is mild. Mild aortic valve stenosis.  7. Note aorta was also severely dilated on prior echo 4.7 cm 01/28/21 No obvious dissection noted consider CTA to further size. Aortic dilatation noted. There is severe dilatation of the ascending aorta, measuring 49 mm. FINDINGS  Left Ventricle: Left ventricular ejection fraction, by estimation, is 50 to 55%. The left ventricle has low normal function. The left ventricle has no regional wall motion abnormalities. The left ventricular internal cavity size was normal in size. There is mild left ventricular hypertrophy. Left ventricular diastolic parameters are indeterminate. Right Ventricle: Pacing wires in RA/RV. The right ventricular size is normal. Right vetricular wall thickness was not assessed. Right ventricular systolic function is normal. Left Atrium: Left atrial size was mildly dilated. Right Atrium: Right atrial size was mildly dilated. Pericardium: There is no evidence of pericardial effusion. Mitral Valve: The mitral valve is abnormal. There is mild thickening of the mitral valve leaflet(s). There is  Physician Discharge Summary   Patient: Douglas Walker MRN: [id number] DOB: Jan 04, 1925  Admit date:     10/03/2023  Discharge date: 10/06/23  Discharge Physician: York Ram Collins Kerby   PCP: Georgann Housekeeper, MD   Recommendations at discharge:    Patient will continue taking Augmentin for 5 more days. Dysphagia 3 diet with aspiration precautions.  Continue furosemide 40 mg daily. Follow up renal function and electrolytes in 7 to 10 days. Follow up with Dr Donette [name] in 7 to 10 days. Resume home health services.   Add palliative care services, frequent hospitalization, he may qualify for home hospice in the near future.   Discharge Diagnoses: Principal Problem:   Multifocal pneumonia Active Problems:   Acute on chronic heart failure with preserved ejection fraction (HFpEF) (HCC)   Paroxysmal atrial fibrillation (HCC)   Essential hypertension   Stage 3a chronic kidney disease (CKD) (HCC)   Chronic indwelling Foley catheter   Hypothyroidism   Dementia (HCC)   Normocytic anemia   Pressure ulcer  Resolved Problems:   * No resolved hospital problems. [facility] Course: [name] was admitted to the hospital with the working diagnosis of acute hypoxemic respiratory failure due to pneumonia and heart failure decompensation.    89 y.o. male with medical history significant for PAF, heart failure, tachy-brady syndrome s/p PPM, CKD stage IIIa, chronic anemia and thrombocytopenia, HTN, HLD, hypothyroidism, essential tremor, recurrent UTI, BPH with chronic indwelling Foley catheter, and dementia who presented with altered mental status.  He was noted to have worsening dyspnea for the last few days prior to admission, positive productive cough, congestion and low grade fevers. Worsening confusion compared to his baseline. On the day of admission home health nurse found patient hypoxemic at 84% on room air, EMS was called and patient was transported to the ED.  Recent  hospitalization 07/31 to 08/12/23 for septic shock due to community acquired pneumonia and recurrent UTI. He was discharged to SNF, from where he was discharged home on 09/11/23.   In the ED his blood pressure was 149/70, HR 70, RR 24, 02 saturation 95% on 3 L/min per [place], lungs with bibasilar rales, with no wheezing, heart with S1 and S2 present and regular, no gallops, rubs or murmurs, abdomen with no distention and no lower extremity edema.   Na 134, K 4,2 Cl 96, bicarbonate 25, glucose 124, bun 23, cr 1,62  AST 30, ALT 26  BNP 526  Lactic acid 1,4 Wbc 10,9 hgb 9,6 plt 106   Chest radiograph with cardiomegaly, bilateral patchy interstitial infiltrates at bases more dense at the left side, with less intense at the left upper lobe.   EKG 70 bpm, normal axis, qtc 460, left bundle branch, ventricular paced rhythm, with underlying atrial fibrillation, no significant ST segment changes, no significant T wave changes.   Patient was placed on antibiotic therapy and diuretics.   10/06 patient responding well to antibiotic therapy.  10/07 continue to improve, symptoms and oxygenation, plan to transition to oral antibiotic and have a close follow up as outpatient.   Assessment and Plan: * Multifocal pneumonia Acute hypoxemic respiratory failure.  Aspiration pneumonia, bilateral lower lobes and left upper lobe (present on admission).   02 saturation 97% on 3 L.min per [place].   Plan to continue supplemental 02 per  to keep 02 saturation 92% or greater.   Patient received IV antibiotic therapy, Unasyn with good toleration, he has been afebrile and with no leukocytosis.  Plan to  no gallops, rubs or murmurs Respiratory with no rales, wheezing or rhonchi Abdomen with no distention  No lower extremity edema.  Pressure ulcer not able to stage  right hip and stage 3 sacrum with no signs of local infection.         Condition at discharge: stable  The results of significant diagnostics from this hospitalization (including imaging, microbiology, ancillary and laboratory) are listed below for reference.   Imaging Studies: ECHOCARDIOGRAM COMPLETE  Result Date: 10/04/2023    ECHOCARDIOGRAM REPORT   Patient Name:   Douglas Walker Date of Exam: 10/04/2023 Medical Rec #:  [id number]          Height:       69.0 in Accession #:    [id number]         Weight:       180.3 lb Date of Birth:  1925-08-29          BSA:          1.977 m Patient Age:    89 years           BP:           122/75 mmHg Patient Gender: M                  HR:           70 bpm. Exam Location:  Inpatient Procedure: 2D Echo, Color Doppler and Cardiac Doppler Indications:    dyspnea  History:        Patient has prior history of Echocardiogram examinations, most                 recent 01/28/2021. Pacemaker, chronic kidney disease; Risk                 Factors:Hypertension.  Sonographer:    Delcie Roch RDCS Referring Phys: [id number] Leelyn Jasinski DANIEL Temika Sutphin IMPRESSIONS  1. Left ventricular ejection fraction, by estimation, is 50 to 55%. The left ventricle has low normal function. The left ventricle has no regional wall motion abnormalities. There is mild left ventricular hypertrophy. Left ventricular diastolic parameters are indeterminate.  2. Pacing wires in RA/RV . Right ventricular systolic function is normal. The right ventricular size is normal.  3. Left  atrial size was mildly dilated.  4. Right atrial size was mildly dilated.  5. The mitral valve is abnormal. Trivial mitral valve regurgitation.  6. The aortic valve is tricuspid. There is moderate calcification of the aortic valve. There is moderate thickening of the aortic valve. Aortic valve regurgitation is mild. Mild aortic valve stenosis.  7. Note aorta was also severely dilated on prior echo 4.7 cm 01/28/21 No obvious dissection noted consider CTA to further size. Aortic dilatation noted. There is severe dilatation of the ascending aorta, measuring 49 mm. FINDINGS  Left Ventricle: Left ventricular ejection fraction, by estimation, is 50 to 55%. The left ventricle has low normal function. The left ventricle has no regional wall motion abnormalities. The left ventricular internal cavity size was normal in size. There is mild left ventricular hypertrophy. Left ventricular diastolic parameters are indeterminate. Right Ventricle: Pacing wires in RA/RV. The right ventricular size is normal. Right vetricular wall thickness was not assessed. Right ventricular systolic function is normal. Left Atrium: Left atrial size was mildly dilated. Right Atrium: Right atrial size was mildly dilated. Pericardium: There is no evidence of pericardial effusion. Mitral Valve: The mitral valve is abnormal. There is mild thickening of the mitral valve leaflet(s). There is  no gallops, rubs or murmurs Respiratory with no rales, wheezing or rhonchi Abdomen with no distention  No lower extremity edema.  Pressure ulcer not able to stage  right hip and stage 3 sacrum with no signs of local infection.         Condition at discharge: stable  The results of significant diagnostics from this hospitalization (including imaging, microbiology, ancillary and laboratory) are listed below for reference.   Imaging Studies: ECHOCARDIOGRAM COMPLETE  Result Date: 10/04/2023    ECHOCARDIOGRAM REPORT   Patient Name:   Douglas Walker Date of Exam: 10/04/2023 Medical Rec #:  [id number]          Height:       69.0 in Accession #:    [id number]         Weight:       180.3 lb Date of Birth:  1925-08-29          BSA:          1.977 m Patient Age:    89 years           BP:           122/75 mmHg Patient Gender: M                  HR:           70 bpm. Exam Location:  Inpatient Procedure: 2D Echo, Color Doppler and Cardiac Doppler Indications:    dyspnea  History:        Patient has prior history of Echocardiogram examinations, most                 recent 01/28/2021. Pacemaker, chronic kidney disease; Risk                 Factors:Hypertension.  Sonographer:    Delcie Roch RDCS Referring Phys: [id number] Leelyn Jasinski DANIEL Temika Sutphin IMPRESSIONS  1. Left ventricular ejection fraction, by estimation, is 50 to 55%. The left ventricle has low normal function. The left ventricle has no regional wall motion abnormalities. There is mild left ventricular hypertrophy. Left ventricular diastolic parameters are indeterminate.  2. Pacing wires in RA/RV . Right ventricular systolic function is normal. The right ventricular size is normal.  3. Left  atrial size was mildly dilated.  4. Right atrial size was mildly dilated.  5. The mitral valve is abnormal. Trivial mitral valve regurgitation.  6. The aortic valve is tricuspid. There is moderate calcification of the aortic valve. There is moderate thickening of the aortic valve. Aortic valve regurgitation is mild. Mild aortic valve stenosis.  7. Note aorta was also severely dilated on prior echo 4.7 cm 01/28/21 No obvious dissection noted consider CTA to further size. Aortic dilatation noted. There is severe dilatation of the ascending aorta, measuring 49 mm. FINDINGS  Left Ventricle: Left ventricular ejection fraction, by estimation, is 50 to 55%. The left ventricle has low normal function. The left ventricle has no regional wall motion abnormalities. The left ventricular internal cavity size was normal in size. There is mild left ventricular hypertrophy. Left ventricular diastolic parameters are indeterminate. Right Ventricle: Pacing wires in RA/RV. The right ventricular size is normal. Right vetricular wall thickness was not assessed. Right ventricular systolic function is normal. Left Atrium: Left atrial size was mildly dilated. Right Atrium: Right atrial size was mildly dilated. Pericardium: There is no evidence of pericardial effusion. Mitral Valve: The mitral valve is abnormal. There is mild thickening of the mitral valve leaflet(s). There is  Physician Discharge Summary   Patient: Douglas Walker MRN: [id number] DOB: Jan 04, 1925  Admit date:     10/03/2023  Discharge date: 10/06/23  Discharge Physician: York Ram Collins Kerby   PCP: Georgann Housekeeper, MD   Recommendations at discharge:    Patient will continue taking Augmentin for 5 more days. Dysphagia 3 diet with aspiration precautions.  Continue furosemide 40 mg daily. Follow up renal function and electrolytes in 7 to 10 days. Follow up with Dr Donette [name] in 7 to 10 days. Resume home health services.   Add palliative care services, frequent hospitalization, he may qualify for home hospice in the near future.   Discharge Diagnoses: Principal Problem:   Multifocal pneumonia Active Problems:   Acute on chronic heart failure with preserved ejection fraction (HFpEF) (HCC)   Paroxysmal atrial fibrillation (HCC)   Essential hypertension   Stage 3a chronic kidney disease (CKD) (HCC)   Chronic indwelling Foley catheter   Hypothyroidism   Dementia (HCC)   Normocytic anemia   Pressure ulcer  Resolved Problems:   * No resolved hospital problems. [facility] Course: [name] was admitted to the hospital with the working diagnosis of acute hypoxemic respiratory failure due to pneumonia and heart failure decompensation.    89 y.o. male with medical history significant for PAF, heart failure, tachy-brady syndrome s/p PPM, CKD stage IIIa, chronic anemia and thrombocytopenia, HTN, HLD, hypothyroidism, essential tremor, recurrent UTI, BPH with chronic indwelling Foley catheter, and dementia who presented with altered mental status.  He was noted to have worsening dyspnea for the last few days prior to admission, positive productive cough, congestion and low grade fevers. Worsening confusion compared to his baseline. On the day of admission home health nurse found patient hypoxemic at 84% on room air, EMS was called and patient was transported to the ED.  Recent  hospitalization 07/31 to 08/12/23 for septic shock due to community acquired pneumonia and recurrent UTI. He was discharged to SNF, from where he was discharged home on 09/11/23.   In the ED his blood pressure was 149/70, HR 70, RR 24, 02 saturation 95% on 3 L/min per [place], lungs with bibasilar rales, with no wheezing, heart with S1 and S2 present and regular, no gallops, rubs or murmurs, abdomen with no distention and no lower extremity edema.   Na 134, K 4,2 Cl 96, bicarbonate 25, glucose 124, bun 23, cr 1,62  AST 30, ALT 26  BNP 526  Lactic acid 1,4 Wbc 10,9 hgb 9,6 plt 106   Chest radiograph with cardiomegaly, bilateral patchy interstitial infiltrates at bases more dense at the left side, with less intense at the left upper lobe.   EKG 70 bpm, normal axis, qtc 460, left bundle branch, ventricular paced rhythm, with underlying atrial fibrillation, no significant ST segment changes, no significant T wave changes.   Patient was placed on antibiotic therapy and diuretics.   10/06 patient responding well to antibiotic therapy.  10/07 continue to improve, symptoms and oxygenation, plan to transition to oral antibiotic and have a close follow up as outpatient.   Assessment and Plan: * Multifocal pneumonia Acute hypoxemic respiratory failure.  Aspiration pneumonia, bilateral lower lobes and left upper lobe (present on admission).   02 saturation 97% on 3 L.min per [place].   Plan to continue supplemental 02 per  to keep 02 saturation 92% or greater.   Patient received IV antibiotic therapy, Unasyn with good toleration, he has been afebrile and with no leukocytosis.  Plan to  no gallops, rubs or murmurs Respiratory with no rales, wheezing or rhonchi Abdomen with no distention  No lower extremity edema.  Pressure ulcer not able to stage  right hip and stage 3 sacrum with no signs of local infection.         Condition at discharge: stable  The results of significant diagnostics from this hospitalization (including imaging, microbiology, ancillary and laboratory) are listed below for reference.   Imaging Studies: ECHOCARDIOGRAM COMPLETE  Result Date: 10/04/2023    ECHOCARDIOGRAM REPORT   Patient Name:   Douglas Walker Date of Exam: 10/04/2023 Medical Rec #:  [id number]          Height:       69.0 in Accession #:    [id number]         Weight:       180.3 lb Date of Birth:  1925-08-29          BSA:          1.977 m Patient Age:    89 years           BP:           122/75 mmHg Patient Gender: M                  HR:           70 bpm. Exam Location:  Inpatient Procedure: 2D Echo, Color Doppler and Cardiac Doppler Indications:    dyspnea  History:        Patient has prior history of Echocardiogram examinations, most                 recent 01/28/2021. Pacemaker, chronic kidney disease; Risk                 Factors:Hypertension.  Sonographer:    Delcie Roch RDCS Referring Phys: [id number] Leelyn Jasinski DANIEL Temika Sutphin IMPRESSIONS  1. Left ventricular ejection fraction, by estimation, is 50 to 55%. The left ventricle has low normal function. The left ventricle has no regional wall motion abnormalities. There is mild left ventricular hypertrophy. Left ventricular diastolic parameters are indeterminate.  2. Pacing wires in RA/RV . Right ventricular systolic function is normal. The right ventricular size is normal.  3. Left  atrial size was mildly dilated.  4. Right atrial size was mildly dilated.  5. The mitral valve is abnormal. Trivial mitral valve regurgitation.  6. The aortic valve is tricuspid. There is moderate calcification of the aortic valve. There is moderate thickening of the aortic valve. Aortic valve regurgitation is mild. Mild aortic valve stenosis.  7. Note aorta was also severely dilated on prior echo 4.7 cm 01/28/21 No obvious dissection noted consider CTA to further size. Aortic dilatation noted. There is severe dilatation of the ascending aorta, measuring 49 mm. FINDINGS  Left Ventricle: Left ventricular ejection fraction, by estimation, is 50 to 55%. The left ventricle has low normal function. The left ventricle has no regional wall motion abnormalities. The left ventricular internal cavity size was normal in size. There is mild left ventricular hypertrophy. Left ventricular diastolic parameters are indeterminate. Right Ventricle: Pacing wires in RA/RV. The right ventricular size is normal. Right vetricular wall thickness was not assessed. Right ventricular systolic function is normal. Left Atrium: Left atrial size was mildly dilated. Right Atrium: Right atrial size was mildly dilated. Pericardium: There is no evidence of pericardial effusion. Mitral Valve: The mitral valve is abnormal. There is mild thickening of the mitral valve leaflet(s). There is  no gallops, rubs or murmurs Respiratory with no rales, wheezing or rhonchi Abdomen with no distention  No lower extremity edema.  Pressure ulcer not able to stage  right hip and stage 3 sacrum with no signs of local infection.         Condition at discharge: stable  The results of significant diagnostics from this hospitalization (including imaging, microbiology, ancillary and laboratory) are listed below for reference.   Imaging Studies: ECHOCARDIOGRAM COMPLETE  Result Date: 10/04/2023    ECHOCARDIOGRAM REPORT   Patient Name:   Douglas Walker Date of Exam: 10/04/2023 Medical Rec #:  [id number]          Height:       69.0 in Accession #:    [id number]         Weight:       180.3 lb Date of Birth:  1925-08-29          BSA:          1.977 m Patient Age:    89 years           BP:           122/75 mmHg Patient Gender: M                  HR:           70 bpm. Exam Location:  Inpatient Procedure: 2D Echo, Color Doppler and Cardiac Doppler Indications:    dyspnea  History:        Patient has prior history of Echocardiogram examinations, most                 recent 01/28/2021. Pacemaker, chronic kidney disease; Risk                 Factors:Hypertension.  Sonographer:    Delcie Roch RDCS Referring Phys: [id number] Leelyn Jasinski DANIEL Temika Sutphin IMPRESSIONS  1. Left ventricular ejection fraction, by estimation, is 50 to 55%. The left ventricle has low normal function. The left ventricle has no regional wall motion abnormalities. There is mild left ventricular hypertrophy. Left ventricular diastolic parameters are indeterminate.  2. Pacing wires in RA/RV . Right ventricular systolic function is normal. The right ventricular size is normal.  3. Left  atrial size was mildly dilated.  4. Right atrial size was mildly dilated.  5. The mitral valve is abnormal. Trivial mitral valve regurgitation.  6. The aortic valve is tricuspid. There is moderate calcification of the aortic valve. There is moderate thickening of the aortic valve. Aortic valve regurgitation is mild. Mild aortic valve stenosis.  7. Note aorta was also severely dilated on prior echo 4.7 cm 01/28/21 No obvious dissection noted consider CTA to further size. Aortic dilatation noted. There is severe dilatation of the ascending aorta, measuring 49 mm. FINDINGS  Left Ventricle: Left ventricular ejection fraction, by estimation, is 50 to 55%. The left ventricle has low normal function. The left ventricle has no regional wall motion abnormalities. The left ventricular internal cavity size was normal in size. There is mild left ventricular hypertrophy. Left ventricular diastolic parameters are indeterminate. Right Ventricle: Pacing wires in RA/RV. The right ventricular size is normal. Right vetricular wall thickness was not assessed. Right ventricular systolic function is normal. Left Atrium: Left atrial size was mildly dilated. Right Atrium: Right atrial size was mildly dilated. Pericardium: There is no evidence of pericardial effusion. Mitral Valve: The mitral valve is abnormal. There is mild thickening of the mitral valve leaflet(s). There is

## 2023-10-06 NOTE — TOC Transition Note (Addendum)
Transition of Care [facility]) - CM/SW    Patient Details  Name: Douglas Walker MRN: [id number] Date of Birth: [birth year]  Transition of Care [facility]) CM/SW Contact:  Leone Haven, RN Phone Number: 10/06/2023, 12:43 PM   Clinical Narrative:      Per MD , son, Brett Canales is interested in him going to SNF if he qualifies.  Will await for PT/OT eval recs.      Barriers to Discharge: Continued Medical Work up   Patient Goals and CMS Choice CMS Medicare.gov Compare Post Acute Care list provided to:: Patient Represenative (must comment) Choice offered to / list presented to : Adult Children  Discharge Placement                         Discharge Plan and Services Additional resources added to the After Visit Summary for   In-house Referral: NA Discharge Planning Services: CM Consult Post Acute Care Choice: Home Health          DME Arranged: N/A DME Agency: NA       HH Arranged: NA          Social Determinants of Health (SDOH) Interventions SDOH Screenings   Food Insecurity: No Food Insecurity (10/03/2023)  Housing: Low Risk  (10/03/2023)  Transportation Needs: No Transportation Needs (10/03/2023)  Utilities: Not At Risk (10/03/2023)  Depression (PHQ2-9): Low Risk  (07/29/2023)  Tobacco Use: Medium Risk (10/03/2023)     Readmission Risk Interventions    10/06/2023   12:21 PM 08/04/2023    9:31 AM 10/15/2021   10:43 AM  Readmission Risk Prevention Plan  Transportation Screening Complete Complete Complete  PCP or Specialist Appt within 3-5 Days  Complete   HRI or Home Care Consult  Complete   Social Work Consult for Recovery Care Planning/Counseling  Complete   Palliative Care Screening  Complete   Medication Review Oceanographer) Complete Complete Complete  PCP or Specialist appointment within 3-5 days of discharge Complete  Complete  HRI or Home Care Consult Complete  Complete  SW Recovery Care/Counseling Consult   Complete  Palliative Care Screening    Not Applicable  Skilled Nursing Facility Not Applicable  Complete

## 2023-10-06 NOTE — Progress Notes (Signed)
[facility] [phone] L[facility]  Notified by [facility] manager of patient/family request for [facility] Palliative services at home after discharge.   Referral entered for patient to receive palliative care at home after discharge.   Please call with any hospice or outpatient palliative care related questions.   Thank you for the opportunity to participate in this patient's care.   Glenna Fellows, BSN, RN, OCN [employer] [phone]

## 2023-10-06 NOTE — Progress Notes (Signed)
Changed pts foley bag to a leg bag for discharge.

## 2023-10-06 NOTE — Evaluation (Signed)
Physical Therapy Evaluation Patient Details Name: Douglas Walker MRN: [id number] DOB: September 25, 1925 Today's Date: 10/06/2023  History of Present Illness  Pt is a 89 y/o M presenting to ED On 10/4 with AMS and tremors, found to be hypoxic on RA (84%) by [facility]. Recently d/c'd from [facility] with sepsis/UTI/PNA on 09/10/2023. Admitted for multifocal PNA. PMH includes dementia, PAF on Eliquis, HFpEF (EF 50-55%), tachy-brady syndrome s/p PPM, CKD IIIA, chronic anemia and thromocytopenia, HTN, HLD, hypothyroidism, essential tremor, recurrent UTI.  Clinical Impression   Pt presents with generalized weakness, impaired balance, impaired activity tolerance. Pt to benefit from acute PT to address deficits. Pt ambulated hallway distance with close guard for safety, SPO2 89-95% on RA during gait, with brief drop to 85% but once pt stopped walking immediate return to 89% or greater. Pt lives at home with his son and daughter-in-law, has all needed equipment. PT recommending HH services at d/c. PT to progress mobility as tolerated, and will continue to follow acutely.     SATURATION QUALIFICATIONS: (This note is used to comply with regulatory documentation for home oxygen)  Patient Saturations on Room Air at Rest = 94%  Patient Saturations on Room Air while Ambulating = 85%, but quick return (seconds) to >89% with standing rest  Patient Saturations on -- Liters of oxygen while Ambulating = --%  Please briefly explain why patient needs home oxygen:---      If plan is discharge home, recommend the following: A little help with walking and/or transfers;A little help with bathing/dressing/bathroom   Can travel by private vehicle        Equipment Recommendations None recommended by PT  Recommendations for Other Services       Functional Status Assessment Patient has had a recent decline in their functional status and demonstrates the ability to make significant improvements in function in a reasonable  and predictable amount of time.     Precautions / Restrictions Precautions Precautions: Fall Precaution Comments: watch O2 Restrictions Weight Bearing Restrictions: No      Mobility  Bed Mobility Overal bed mobility: Needs Assistance             General bed mobility comments: OOB in chair upon arrival and departure    Transfers Overall transfer level: Needs assistance Equipment used: Rolling walker (2 wheels) Transfers: Sit to/from Stand Sit to Stand: Min assist, Contact guard assist           General transfer comment: light assist for initial rise, then CGA for safety. cues for hand placement when standing and sitting    Ambulation/Gait Ambulation/Gait assistance: Contact guard assist Gait Distance (Feet): 200 Feet Assistive device: Rolling walker (2 wheels) Gait Pattern/deviations: Step-through pattern, Decreased stride length, Trunk flexed Gait velocity: decr     General Gait Details: close guard for safety, cues for hallway navigation. SPO2 89-95% on RA during gait, with brief drop to 85% but once pt stopped walking immediate return to 89% or greater  Stairs            Wheelchair Mobility     Tilt Bed    Modified Rankin (Stroke Patients Only)       Balance Overall balance assessment: Needs assistance Sitting-balance support: Feet supported Sitting balance-Leahy Scale: Good     Standing balance support: During functional activity, Reliant on assistive device for balance Standing balance-Leahy Scale: Poor Standing balance comment: reliant on RW  Pertinent Vitals/Pain Pain Assessment Pain Assessment: Faces Faces Pain Scale: Hurts little more Pain Location: back, buttocks, knee Pain Descriptors / Indicators: Sore, Discomfort, Grimacing Pain Intervention(s): Limited activity within patient's tolerance, Monitored during session    Home Living Family/patient expects to be discharged to:: Private  residence Living Arrangements: Children Available Help at Discharge: Available 24 hours/day Type of Home: House Home Access: Level entry       Home Layout: One level Home Equipment: Agricultural consultant (2 wheels);Grab bars - tub/shower;Shower seat Additional Comments: lives with son, DIL, grandson.  Family stating patient will accept any and all help offered to him.    Prior Function Prior Level of Function : Independent/Modified Independent;History of Falls (last six months)             Mobility Comments: Used RW at [employer] I ADLs Comments: son assists with medications, and lays out his clothes     Extremity/Trunk Assessment   Upper Extremity Assessment Upper Extremity Assessment: Defer to OT evaluation    Lower Extremity Assessment Lower Extremity Assessment: Generalized weakness (4/5 throughout, functionally appears weak during gait)    Cervical / Trunk Assessment Cervical / Trunk Assessment: Kyphotic  Communication   Communication Communication: Hearing impairment Cueing Techniques: Verbal cues;Gestural cues  Cognition Arousal: Alert Behavior During Therapy: WFL for tasks assessed/performed Overall Cognitive Status: History of cognitive impairments - at baseline                                 General Comments: hx of dementia, following commands with incr time/repetition of instructions        General Comments General comments (skin integrity, edema, etc.): SpO2 mid 80's-mid 90's on RA,  increases quickly with cues for PLB    Exercises     Assessment/Plan    PT Assessment Patient needs continued PT services  PT Problem List Decreased strength;Decreased mobility;Decreased activity tolerance;Decreased balance;Decreased knowledge of use of DME;Pain;Decreased safety awareness       PT Treatment Interventions DME instruction;Therapeutic activities;Gait training;Therapeutic exercise;Patient/family education;Balance training;Stair training;Functional  mobility training;Neuromuscular re-education    PT Goals (Current goals can be found in the Care Plan section)  Acute Rehab PT Goals Patient Stated Goal: home PT Goal Formulation: With patient Time For Goal Achievement: 10/20/23 Potential to Achieve Goals: Good    Frequency Min 1X/week     Co-evaluation   Reason for Co-Treatment: For patient/therapist safety;To address functional/ADL transfers   OT goals addressed during session: ADL's and self-care;Strengthening/ROM       AM-PAC PT "6 Clicks" Mobility  Outcome Measure Help needed turning from your back to your side while in a flat bed without using bedrails?: A Little Help needed moving from lying on your back to sitting on the side of a flat bed without using bedrails?: A Little Help needed moving to and from a bed to a chair (including a wheelchair)?: A Little Help needed standing up from a chair using your arms (e.g., wheelchair or bedside chair)?: A Little Help needed to walk in hospital room?: A Little Help needed climbing 3-5 steps with a railing? : A Little 6 Click Score: 18    End of Session   Activity Tolerance: Patient tolerated treatment well Patient left: in chair;with call bell/phone within reach;with chair alarm set Nurse Communication: Mobility status;Other (comment) (SpO2 response to activity, now on RA) PT Visit Diagnosis: Other abnormalities of gait and mobility (R26.89);Muscle weakness (generalized) (M62.81)  Time: 1610-9604 PT Time Calculation (min) (ACUTE ONLY): 25 min   Charges:   PT Evaluation $PT Eval Low Complexity: 1 Low   PT General Charges $$ ACUTE PT VISIT: 1 Visit         Marye Round, PT DPT Acute Rehabilitation Services Secure Chat Preferred  Office [phone]   Douglas Walker 10/06/2023, 2:50 PM

## 2023-10-06 NOTE — Progress Notes (Signed)
Heart Failure Navigator Progress Note  Assessed for Heart & Vascular TOC clinic readiness.  Patient does not meet criteria due to per MD patient with Dementia..   Navigator will sign off at this time.   Rhae Hammock, BSN, Scientist, clinical (histocompatibility and immunogenetics) Only

## 2023-10-06 NOTE — Assessment & Plan Note (Signed)
Urine analysis SG 1,009, negative protein, large leukocytes. Many bacteria.  Rbc 11-20 and > 50 wbc.   Urine culture positive for proteus and morganella. >100,000 CFU.  Patient has been treated for pneumonia with IV Unasyn with improvement of his symptoms.  Considering his chronic indwelling foley catheter and more than one bacteria positive no clear if true infection.  He is being discharge with Augmentin for 5 more days, Plan to follow up as outpatient and continue foley care as per protocol.

## 2023-10-07 ENCOUNTER — Inpatient Hospital Stay (HOSPITAL_COMMUNITY)
Admission: EM | Admit: 2023-10-07 | Discharge: 2023-10-15 | DRG: 871 | Disposition: A | Discharge status: Home Health | Payer: Medicare Other | Attending: Family Medicine | Admitting: Family Medicine

## 2023-10-07 ENCOUNTER — Emergency Department (HOSPITAL_COMMUNITY): Payer: Medicare Other

## 2023-10-07 ENCOUNTER — Other Ambulatory Visit: Payer: Self-pay

## 2023-10-07 ENCOUNTER — Encounter (HOSPITAL_COMMUNITY): Payer: Self-pay

## 2023-10-07 DIAGNOSIS — Z821 Family history of blindness and visual loss: Secondary | ICD-10-CM

## 2023-10-07 DIAGNOSIS — I495 Sick sinus syndrome: Secondary | ICD-10-CM | POA: Diagnosis present

## 2023-10-07 DIAGNOSIS — D61818 Other pancytopenia: Secondary | ICD-10-CM | POA: Diagnosis present

## 2023-10-07 DIAGNOSIS — Y846 Urinary catheterization as the cause of abnormal reaction of the patient, or of later complication, without mention of misadventure at the time of the procedure: Secondary | ICD-10-CM | POA: Diagnosis present

## 2023-10-07 DIAGNOSIS — J9601 Acute respiratory failure with hypoxia: Secondary | ICD-10-CM | POA: Diagnosis present

## 2023-10-07 DIAGNOSIS — D696 Thrombocytopenia, unspecified: Secondary | ICD-10-CM | POA: Diagnosis present

## 2023-10-07 DIAGNOSIS — N400 Enlarged prostate without lower urinary tract symptoms: Secondary | ICD-10-CM | POA: Diagnosis present

## 2023-10-07 DIAGNOSIS — F03B0 Unspecified dementia, moderate, without behavioral disturbance, psychotic disturbance, mood disturbance, and anxiety: Secondary | ICD-10-CM | POA: Diagnosis not present

## 2023-10-07 DIAGNOSIS — T83518A Infection and inflammatory reaction due to other urinary catheter, initial encounter: Secondary | ICD-10-CM | POA: Diagnosis present

## 2023-10-07 DIAGNOSIS — Z882 Allergy status to sulfonamides status: Secondary | ICD-10-CM

## 2023-10-07 DIAGNOSIS — Z1152 Encounter for screening for COVID-19: Secondary | ICD-10-CM

## 2023-10-07 DIAGNOSIS — I480 Paroxysmal atrial fibrillation: Secondary | ICD-10-CM | POA: Diagnosis present

## 2023-10-07 DIAGNOSIS — I272 Pulmonary hypertension, unspecified: Secondary | ICD-10-CM | POA: Diagnosis present

## 2023-10-07 DIAGNOSIS — R6520 Severe sepsis without septic shock: Secondary | ICD-10-CM | POA: Diagnosis present

## 2023-10-07 DIAGNOSIS — Z888 Allergy status to other drugs, medicaments and biological substances status: Secondary | ICD-10-CM

## 2023-10-07 DIAGNOSIS — F0390 Unspecified dementia without behavioral disturbance: Secondary | ICD-10-CM | POA: Diagnosis present

## 2023-10-07 DIAGNOSIS — Z978 Presence of other specified devices: Secondary | ICD-10-CM

## 2023-10-07 DIAGNOSIS — A419 Sepsis, unspecified organism: Principal | ICD-10-CM | POA: Diagnosis present

## 2023-10-07 DIAGNOSIS — I5042 Chronic combined systolic (congestive) and diastolic (congestive) heart failure: Secondary | ICD-10-CM | POA: Diagnosis present

## 2023-10-07 DIAGNOSIS — K219 Gastro-esophageal reflux disease without esophagitis: Secondary | ICD-10-CM | POA: Diagnosis present

## 2023-10-07 DIAGNOSIS — I5032 Chronic diastolic (congestive) heart failure: Secondary | ICD-10-CM | POA: Diagnosis not present

## 2023-10-07 DIAGNOSIS — R0603 Acute respiratory distress: Secondary | ICD-10-CM | POA: Diagnosis present

## 2023-10-07 DIAGNOSIS — N390 Urinary tract infection, site not specified: Secondary | ICD-10-CM | POA: Diagnosis present

## 2023-10-07 DIAGNOSIS — L89152 Pressure ulcer of sacral region, stage 2: Secondary | ICD-10-CM | POA: Diagnosis present

## 2023-10-07 DIAGNOSIS — E039 Hypothyroidism, unspecified: Secondary | ICD-10-CM | POA: Diagnosis present

## 2023-10-07 DIAGNOSIS — Z950 Presence of cardiac pacemaker: Secondary | ICD-10-CM

## 2023-10-07 DIAGNOSIS — I130 Hypertensive heart and chronic kidney disease with heart failure and stage 1 through stage 4 chronic kidney disease, or unspecified chronic kidney disease: Secondary | ICD-10-CM | POA: Diagnosis present

## 2023-10-07 DIAGNOSIS — G894 Chronic pain syndrome: Secondary | ICD-10-CM | POA: Diagnosis present

## 2023-10-07 DIAGNOSIS — I69391 Dysphagia following cerebral infarction: Secondary | ICD-10-CM | POA: Diagnosis not present

## 2023-10-07 DIAGNOSIS — D539 Nutritional anemia, unspecified: Secondary | ICD-10-CM | POA: Diagnosis present

## 2023-10-07 DIAGNOSIS — J189 Pneumonia, unspecified organism: Secondary | ICD-10-CM | POA: Diagnosis present

## 2023-10-07 DIAGNOSIS — Z87891 Personal history of nicotine dependence: Secondary | ICD-10-CM

## 2023-10-07 DIAGNOSIS — L89896 Pressure-induced deep tissue damage of other site: Secondary | ICD-10-CM | POA: Diagnosis present

## 2023-10-07 DIAGNOSIS — G250 Essential tremor: Secondary | ICD-10-CM | POA: Diagnosis present

## 2023-10-07 DIAGNOSIS — Z79899 Other long term (current) drug therapy: Secondary | ICD-10-CM

## 2023-10-07 DIAGNOSIS — Z79890 Hormone replacement therapy (postmenopausal): Secondary | ICD-10-CM

## 2023-10-07 DIAGNOSIS — L89210 Pressure ulcer of right hip, unstageable: Secondary | ICD-10-CM | POA: Diagnosis present

## 2023-10-07 DIAGNOSIS — Z7901 Long term (current) use of anticoagulants: Secondary | ICD-10-CM

## 2023-10-07 DIAGNOSIS — Z85828 Personal history of other malignant neoplasm of skin: Secondary | ICD-10-CM

## 2023-10-07 DIAGNOSIS — N1832 Chronic kidney disease, stage 3b: Secondary | ICD-10-CM | POA: Diagnosis present

## 2023-10-07 DIAGNOSIS — Z66 Do not resuscitate: Secondary | ICD-10-CM | POA: Diagnosis present

## 2023-10-07 DIAGNOSIS — G8929 Other chronic pain: Secondary | ICD-10-CM | POA: Diagnosis present

## 2023-10-07 DIAGNOSIS — H9190 Unspecified hearing loss, unspecified ear: Secondary | ICD-10-CM | POA: Diagnosis present

## 2023-10-07 DIAGNOSIS — N183 Chronic kidney disease, stage 3 unspecified: Secondary | ICD-10-CM | POA: Diagnosis present

## 2023-10-07 DIAGNOSIS — E785 Hyperlipidemia, unspecified: Secondary | ICD-10-CM | POA: Diagnosis present

## 2023-10-07 LAB — I-STAT VENOUS BLOOD GAS, ED
Acid-Base Excess: 2 mmol/L (ref 0.0–2.0)
Bicarbonate: 28.4 mmol/L — ABNORMAL HIGH (ref 20.0–28.0)
Calcium, Ion: 1.2 mmol/L (ref 1.15–1.40)
HCT: 38 % — ABNORMAL LOW (ref 39.0–52.0)
Hemoglobin: 12.9 g/dL — ABNORMAL LOW (ref 13.0–17.0)
O2 Saturation: 37 %
Potassium: 3.8 mmol/L (ref 3.5–5.1)
Sodium: 139 mmol/L (ref 135–145)
TCO2: 30 mmol/L (ref 22–32)
pCO2, Ven: 50.9 mm[Hg] (ref 44–60)
pH, Ven: 7.355 (ref 7.25–7.43)
pO2, Ven: 23 mm[Hg] — CL (ref 32–45)

## 2023-10-07 LAB — COMPREHENSIVE METABOLIC PANEL
ALT: 20 U/L (ref 0–44)
AST: 24 U/L (ref 15–41)
Albumin: 3.1 g/dL — ABNORMAL LOW (ref 3.5–5.0)
Alkaline Phosphatase: 127 U/L — ABNORMAL HIGH (ref 38–126)
Anion gap: 13 (ref 5–15)
BUN: 23 mg/dL (ref 8–23)
CO2: 26 mmol/L (ref 22–32)
Calcium: 9.3 mg/dL (ref 8.9–10.3)
Chloride: 95 mmol/L — ABNORMAL LOW (ref 98–111)
Creatinine, Ser: 1.55 mg/dL — ABNORMAL HIGH (ref 0.61–1.24)
GFR, Estimated: 41 mL/min — ABNORMAL LOW (ref >60)
Glucose, Bld: 111 mg/dL — ABNORMAL HIGH (ref 70–99)
Potassium: 3.6 mmol/L (ref 3.5–5.1)
Sodium: 134 mmol/L — ABNORMAL LOW (ref 135–145)
Total Bilirubin: 1 mg/dL (ref 0.3–1.2)
Total Protein: 8.1 g/dL (ref 6.5–8.1)

## 2023-10-07 LAB — RESP PANEL BY RT-PCR (RSV, FLU A&B, COVID)  RVPGX2
Influenza A by PCR: NEGATIVE
Influenza B by PCR: NEGATIVE
Resp Syncytial Virus by PCR: NEGATIVE
SARS Coronavirus 2 by RT PCR: NEGATIVE

## 2023-10-07 LAB — URINALYSIS, ROUTINE W REFLEX MICROSCOPIC
Bilirubin Urine: NEGATIVE
Glucose, UA: NEGATIVE mg/dL
Ketones, ur: NEGATIVE mg/dL
Nitrite: NEGATIVE
Protein, ur: 30 mg/dL — AB
Specific Gravity, Urine: 1.02 (ref 1.005–1.030)
WBC, UA: >50 WBC/hpf (ref 0–5)
pH: 5 (ref 5.0–8.0)

## 2023-10-07 LAB — CBC
HCT: 36 % — ABNORMAL LOW (ref 39.0–52.0)
Hemoglobin: 11.9 g/dL — ABNORMAL LOW (ref 13.0–17.0)
MCH: 30.8 pg (ref 26.0–34.0)
MCHC: 33.1 g/dL (ref 30.0–36.0)
MCV: 93.3 fL (ref 80.0–100.0)
Platelets: 138 10*3/uL — ABNORMAL LOW (ref 150–400)
RBC: 3.86 MIL/uL — ABNORMAL LOW (ref 4.22–5.81)
RDW: 16 % — ABNORMAL HIGH (ref 11.5–15.5)
WBC: 18.1 10*3/uL — ABNORMAL HIGH (ref 4.0–10.5)
nRBC: 0 % (ref 0.0–0.2)

## 2023-10-07 LAB — PROCALCITONIN: Procalcitonin: 0.12 ng/mL

## 2023-10-07 LAB — LACTIC ACID, PLASMA
Lactic Acid, Venous: 1.8 mmol/L (ref 0.5–1.9)
Lactic Acid, Venous: 1.5 mmol/L (ref 0.5–1.9)

## 2023-10-07 LAB — BRAIN NATRIURETIC PEPTIDE: B Natriuretic Peptide: 249.6 pg/mL — ABNORMAL HIGH (ref 0.0–100.0)

## 2023-10-07 MED ORDER — SODIUM CHLORIDE 0.9 % IV BOLUS
500.0000 mL | Freq: Once | INTRAVENOUS | Status: AC
  Administered 2023-10-07: 500 mL via INTRAVENOUS

## 2023-10-07 MED ORDER — ALBUTEROL SULFATE (2.5 MG/3ML) 0.083% IN NEBU: 2.5000 mg | INHALATION_SOLUTION | RESPIRATORY_TRACT | Status: DC | PRN

## 2023-10-07 MED ORDER — ACETAMINOPHEN 650 MG RE SUPP: 650.0000 mg | Freq: Four times a day (QID) | RECTAL | Status: DC | PRN

## 2023-10-07 MED ORDER — SODIUM CHLORIDE 0.9 % IV SOLN
500.0000 mg | INTRAVENOUS | Status: AC
  Administered 2023-10-08 – 2023-10-11 (×4): 500 mg via INTRAVENOUS
  Filled 2023-10-07 (×4): qty 5

## 2023-10-07 MED ORDER — DULOXETINE HCL 60 MG PO CPEP
60.0000 mg | ORAL_CAPSULE | Freq: Every day | ORAL | Status: DC
  Administered 2023-10-07 – 2023-10-15 (×9): 60 mg via ORAL
  Filled 2023-10-07 (×3): qty 1
  Filled 2023-10-07 (×2): qty 2
  Filled 2023-10-07: qty 1
  Filled 2023-10-07: qty 2
  Filled 2023-10-07 (×2): qty 1

## 2023-10-07 MED ORDER — SODIUM CHLORIDE 0.9% FLUSH
3.0000 mL | Freq: Two times a day (BID) | INTRAVENOUS | Status: DC
  Administered 2023-10-07 – 2023-10-15 (×10): 3 mL via INTRAVENOUS

## 2023-10-07 MED ORDER — OCUVITE ADULT 50+ PO CAPS: 1.0000 | ORAL_CAPSULE | Freq: Every day | ORAL | Status: DC

## 2023-10-07 MED ORDER — SODIUM CHLORIDE 0.9 % IV SOLN
1.0000 g | Freq: Once | INTRAVENOUS | Status: AC
  Administered 2023-10-07: 1 g via INTRAVENOUS
  Filled 2023-10-07: qty 10

## 2023-10-07 MED ORDER — SODIUM CHLORIDE 0.9 % IV SOLN: 1.0000 g | Freq: Once | INTRAVENOUS | Status: DC

## 2023-10-07 MED ORDER — SODIUM CHLORIDE 0.9 % IV SOLN: 2.0000 g | INTRAVENOUS | Status: DC

## 2023-10-07 MED ORDER — SODIUM CHLORIDE 0.9 % IV BOLUS
250.0000 mL | Freq: Once | INTRAVENOUS | Status: AC
  Administered 2023-10-07: 250 mL via INTRAVENOUS

## 2023-10-07 MED ORDER — LEVOTHYROXINE SODIUM 25 MCG PO TABS
25.0000 ug | ORAL_TABLET | Freq: Every day | ORAL | Status: DC
  Administered 2023-10-08 – 2023-10-15 (×8): 25 ug via ORAL
  Filled 2023-10-07 (×9): qty 1

## 2023-10-07 MED ORDER — TRAMADOL HCL 50 MG PO TABS
50.0000 mg | ORAL_TABLET | Freq: Two times a day (BID) | ORAL | Status: DC
  Administered 2023-10-07 – 2023-10-15 (×16): 50 mg via ORAL
  Filled 2023-10-07 (×16): qty 1

## 2023-10-07 MED ORDER — SODIUM CHLORIDE 0.9 % IV SOLN
500.0000 mg | Freq: Once | INTRAVENOUS | Status: AC
  Administered 2023-10-07: 500 mg via INTRAVENOUS
  Filled 2023-10-07: qty 5

## 2023-10-07 MED ORDER — APIXABAN 2.5 MG PO TABS
2.5000 mg | ORAL_TABLET | Freq: Two times a day (BID) | ORAL | Status: DC
  Administered 2023-10-07 – 2023-10-15 (×16): 2.5 mg via ORAL
  Filled 2023-10-07 (×16): qty 1

## 2023-10-07 MED ORDER — ACETAMINOPHEN 325 MG PO TABS: 650.0000 mg | ORAL_TABLET | Freq: Once | ORAL | Status: DC

## 2023-10-07 MED ORDER — ACETAMINOPHEN 650 MG RE SUPP
650.0000 mg | Freq: Once | RECTAL | Status: AC
  Administered 2023-10-07: 650 mg via RECTAL
  Filled 2023-10-07: qty 1

## 2023-10-07 MED ORDER — SODIUM CHLORIDE 0.9 % IV SOLN: INTRAVENOUS | Status: DC

## 2023-10-07 MED ORDER — PIPERACILLIN-TAZOBACTAM 3.375 G IVPB 30 MIN
3.3750 g | Freq: Once | INTRAVENOUS | Status: AC
  Administered 2023-10-07: 3.375 g via INTRAVENOUS
  Filled 2023-10-07: qty 50

## 2023-10-07 MED ORDER — PIPERACILLIN-TAZOBACTAM 3.375 G IVPB
3.3750 g | Freq: Three times a day (TID) | INTRAVENOUS | Status: AC
  Administered 2023-10-07 – 2023-10-11 (×12): 3.375 g via INTRAVENOUS
  Filled 2023-10-07 (×14): qty 50

## 2023-10-07 MED ORDER — FUROSEMIDE 10 MG/ML IJ SOLN
40.0000 mg | Freq: Once | INTRAMUSCULAR | Status: AC
  Administered 2023-10-07: 40 mg via INTRAVENOUS
  Filled 2023-10-07: qty 4

## 2023-10-07 MED ORDER — ACETAMINOPHEN 325 MG PO TABS
650.0000 mg | ORAL_TABLET | Freq: Four times a day (QID) | ORAL | Status: DC | PRN
  Filled 2023-10-07: qty 2

## 2023-10-07 MED ORDER — NYSTATIN 100000 UNIT/GM EX OINT
1.0000 | TOPICAL_OINTMENT | Freq: Two times a day (BID) | CUTANEOUS | Status: DC
  Administered 2023-10-08 – 2023-10-15 (×12): 1 via TOPICAL
  Filled 2023-10-07: qty 15

## 2023-10-07 NOTE — ED Notes (Signed)
Patient is resting comfortably. 

## 2023-10-07 NOTE — H&P (Addendum)
History and Physical    Patient: Douglas Walker DOB: [birth year] DOA: 10/07/2023 DOS: the patient was seen and examined on 10/07/2023 PCP: Georgann Housekeeper, MD  Patient coming from: Home lives with son via EMS  Chief Complaint:  Chief Complaint  Patient presents with   Shortness of Breath   HPI: Douglas Walker is a 89 y.o. male with medical history significant of hypertension, hyperlipidemia, paroxysmal atrial fibrillation on Eliquis, heart failure with preserved EF (50 to 55%, tachybradycardia syndrome s/p PPM, CKD stage III, pancytopenia, hypothyroidism, essential tremor, recurrent UTIs on Macrobid, BPH with chronic indwelling Foley catheter, and dementia who presented due to shortness of breath.  History is obtained from patient's son present at bedside.  Patient had just recently been hospitalized 10/4-10/7 for acute respiratory failure with hypoxia due to multifocal pneumonia and decompensated heart failure.  Pneumonia was thought secondary to aspiration and patient was placed on a dysphagia 3 diet after being evaluated by speech.  He was discharged home yesterday and was recommended to continue Augmentin for 5 more days.  He seemed to be okay and was able to ambulate with his walker.  He sat watch TV and ate dinner.  However this morning around 6:45 AM called out stating he needed something before he vomited.  Son notes that he only vomited up a little bit at that time.  His son checked his vitals and noted that he was afebrile at that time and blood pressures were elevated up to 178/80, but pulse oximetry was noted to drop as low as 53 room air for which called EMS.  In the emergency department patient was noted to be febrile up to 101.4 F with tachypnea blood pressures elevated up to 171/93, and O2 saturation systolics 86% on room air for which patient was placed on BiPAP.  Labs significant for WBC 18.1, hemoglobin 11.9, platelets 138, sodium 134, BUN 23, creatinine 1.55,  and lactic acid 1.8.  Chest x-ray had noted slight interval worsening of congestive heart failure/pulmonary edema with possible superimposed pneumonia.  Influenza, COVID-19, and RSV screening were negative.  Blood cultures have been obtained.  Patient was given Lasix 40 mg IV x 1 dose, Rocephin, azithromycin, and acetaminophen.  Review of Systems: unable to review all systems due to the inability of the patient to answer questions. Past Medical History:  Diagnosis Date   Anemia    Arthritis    shoulders and back   Cancer (HCC)    skin cancers   Chronic diastolic (congestive) heart failure (HCC)    Chronic low back pain 12/15/2017   CKD (chronic kidney disease) stage 3, GFR 30-59 ml/min (HCC)    Essential tremor 02/28/2016   GERD (gastroesophageal reflux disease)    Hernia    History of hiatal hernia    Hyperlipemia    Hypertension    Macular degeneration    Neuromuscular disorder (HCC)    neuropathy   Paroxysmal atrial fibrillation (HCC) 10/28/2013   Presence of permanent cardiac pacemaker 12/04/2018   Pulmonary hypertension (HCC)    Stroke (HCC) 12/2020   Tachycardia-bradycardia syndrome (HCC) 09/13/2014   Thoracic aortic aneurysm (HCC)    Thrombocytopenia (HCC)    TIA (transient ischemic attack)    Past Surgical History:  Procedure Laterality Date   APPENDECTOMY     EYE SURGERY     HERNIA REPAIR     HERNIA REPAIR     INSERT / REPLACE / REMOVE PACEMAKER  12/04/2018   IRRIGATION AND DEBRIDEMENT ABSCESS  Right 07/24/2020   Procedure: IRRIGATION AND DEBRIDEMENT HEMATOMA;  Surgeon: Abigail Miyamoto, MD;  Location[facility];  Service: General;  Laterality: Right;   MASS EXCISION Right 07/20/2020   Procedure: EXCISION OF RIGHT CHEST WALL MASS;  Surgeon: Almond Lint, MD;  Location: [facility] OR;  Service: General;  Laterality: Right;   PACEMAKER IMPLANT N/A 12/04/2018   Procedure: PACEMAKER IMPLANT;  Surgeon: Marinus Maw, MD;  Location: [facility] INVASIVE CV LAB;  Service: Cardiovascular;   Laterality: N/A;   Social History:  reports that he quit smoking about 60 years ago. His smoking use included cigarettes. He has never used smokeless tobacco. He reports that he does not drink alcohol and does not use drugs.  Allergies  Allergen Reactions   Sulfa Antibiotics Other (See Comments)    Weakness Dizziness  Sweats    Mysoline [Primidone] Other (See Comments)    Sedation    Zocor [Simvastatin] Other (See Comments)    Arthralgias Fatigue    Family History  Problem Relation Age of Onset   GI problems Mother    Other Sister        PAIN ISSUES   Hearing loss Sister    Blindness Sister    Heart attack Neg Hx    Stroke Neg Hx     Prior to Admission medications   Medication Sig Start Date End Date Taking? Authorizing Provider  acetaminophen (TYLENOL) 325 MG tablet Take 2 tablets (650 mg total) by mouth every 4 (four) hours as needed for mild pain (or temp > 37.5 C (99.5 F)). 02/14/21   Angiulli, Mcarthur Rossetti, PA-C  amoxicillin-clavulanate (AUGMENTIN) 500-125 MG tablet Take 1 tablet by mouth 2 (two) times daily for 5 days. 10/06/23 10/11/23  Arrien, York Ram, MD  apixaban (ELIQUIS) 2.5 MG TABS tablet Take 1 tablet (2.5 mg total) by mouth 2 (two) times daily. 09/24/22   Dunn, Tacey Ruiz, PA-C  Ascorbic Acid (VITAMIN C) 1000 MG tablet Take 1,000 mg by mouth daily.    [provider]  DULoxetine (CYMBALTA) 60 MG capsule Take 1 capsule (60 mg total) by mouth daily. 04/23/21   Lovorn, Aundra Millet, MD  furosemide (LASIX) 40 MG tablet Take 1 tablet (40 mg total) by mouth daily. 10/02/23   Christell Constant, MD  levothyroxine (SYNTHROID) 25 MCG tablet Take 1 tablet (25 mcg total) by mouth daily before breakfast. 02/14/21   Angiulli, Mcarthur Rossetti, PA-C  loratadine (CLARITIN) 10 MG tablet Take 10 mg by mouth daily.    [provider]  Multiple Vitamin (MULTIVITAMIN WITH MINERALS) TABS tablet Take 1 tablet by mouth daily.    [provider]  Multiple Vitamins-Minerals  (OCUVITE ADULT 50+) CAPS Take 1 capsule by mouth daily.    [provider]  nitrofurantoin, macrocrystal-monohydrate, (MACROBID) 100 MG capsule Take 100 mg by mouth at bedtime. 09/15/23   [provider]  nystatin ointment (MYCOSTATIN) Apply topically 2 (two) times daily. Apply to rash over R face 08/12/23   Jerald Kief, MD  polyethylene glycol (MIRALAX / GLYCOLAX) 17 g packet Take 17 g by mouth daily. Patient taking differently: Take 17 g by mouth daily as needed for moderate constipation. 07/08/21   Narda Bonds, MD  pregabalin (LYRICA) 50 MG capsule Take 50 mg by mouth at bedtime.    [provider]  Topiramate (TOPAMAX PO) Take 1 tablet by mouth daily.    [provider]  traMADol (ULTRAM) 50 MG tablet Take 1 tablet (50 mg total) by mouth 2 (  two) times daily. 08/12/23   Jerald Kief, MD  vitamin B-12 (CYANOCOBALAMIN) 1000 MCG tablet Take 1 tablet (1,000 mcg total) by mouth every other day. 02/14/21   Charlton Amor, PA-C    Physical Exam: Vitals:   10/07/23 0830 10/07/23 0900 10/07/23 0918 10/07/23 0922  BP: (!) 159/63 (!) 150/60  (!) 150/60  Pulse: 70 70  70  Resp: (!) 25 (!) 23  (!) 23  Temp:   (!) 101.4 F (38.6 C)   TempSrc:   Oral   SpO2: (!) 86% 93%  94%    Constitutional: Elderly male who appears to be in no acute distress at this time. Eyes:  lids and conjunctivae normal ENMT: Mucous membranes are moist.  Hard of hearing.  Edentulous. Neck: normal, supple, no masses, no thyromegaly Respiratory: Decreased overall aeration currently on BiPAP. Cardiovascular: Regular rate and rhythm, no murmurs / rubs / gallops. No extremity edema. 2+ pedal pulses. No carotid bruits.  Abdomen: no tenderness, no masses palpated. No hepatosplenomegaly. Bowel sounds positive.  Musculoskeletal: no clubbing / cyanosis. No joint deformity upper and lower extremities. Good ROM, no contractures. Normal muscle tone.  Skin: no rashes, lesions, ulcers. No  induration Neurologic: CN 2-12 grossly intact. Sensation intact, DTR normal. Strength 5/5 in all 4.  Psychiatric:  Alert and oriented to self.  Normal mood.  Data Reviewed:  EKG noted paced  rhythm 70 bpm with RBBB and LAFB similar to priors.  Reviewed labs, imaging, and pertinent records as noted above in the HPI  Assessment and Plan:  Acute respiratory failure with hypoxia Sepsis secondary to pneumonia Patient was noted to be febrile up to 101.4 F with tachypnea and white blood cell count elevated at 18.1 meeting SIRS criteria. O2 saturations were noted to be as low as 86% on room air.  Patient had been placed on BiPAP for work of breathing.   Chest x-ray noted slight worsening concern for possible interstitial edema.  Patient also noted to have episodes of nausea and vomiting.  Suspecting pneumonia as a likely cause given recent hospitalization for the same, but other possible source includes urine. -Admit to a progressive bed -Follow-up blood cultures -Aspiration precautions with elevation head of bed -Continuous pulse oximetry with oxygen maintain O2 saturation greater than 92%. -Continue BiPAP and wean off once able -Check procalcitonin -Continue azithromycin  -Changed Rocephin to Zosyn for coverage of possibility of a urinary source of infection -Tylenol as needed  Heart failure with preserved EF Chronic.  Patient does not appear to be fluid overloaded at this time.  Last echocardiogram noted EF to be 50 to 55% LVH, LA and RA dilation, mild aortic stenosis, and severe dilation of the ascending aorta measuring 49 mm. Patient had been given Lasix 40 mg IV x 1 dose. -Strict I&Os and daily weights -Follow-up BNP(249.6 which is lower than priors).  Holding any additional IV diuresis at this time  Complicated urinary tract infection Chronic indwelling Foley catheter Patient has chronic indwelling Foley catheter.  Urine cultures from 9/4 hospitalization were positive for Proteus  mirabilis and Morganella morganii susceptible to Zosyn.  Patient manage report possibly related to colonization Foley catheter was changed while in the emergency department.  -Check urinalysis and urine culture(Urinalysis was positive for moderate hemoglobin, large leukocytes, many bacteria, 21-50 RBCs/hpf, and greater than 50 WBCs) -Antibiotics as noted above  Paroxysmal atrial fibrillation on chronic anticoagulation Tachybradycardia syndrome s/p PPM Patient is not on any significant rate controlling medications -Continue Eliquis once able  Chronic kidney disease stage IIIb Creatinine 1.5 with BUN 23.  Creatinine appears near patient's baseline. -Continue to monitor  Dysphagia Patient has been evaluated by speech therapy and recommended to be on a dysphagia 3 diet during last hospitalization. -N.p.o. for now while on BiPAP -Advance as able to a dysphagia 3 diet  Pancytopenia Chronic.  Hemoglobin 11.9 with platelets 138. -Continue to monitor  Hypothyroidism TSH last noted to be 3.068 on 07/30/2023. -Continue Synthroid  Chronic pain Patient has chronic issues with back pain. -Continue Lyrica and tramadol once able  Dementia Mood is stable at this time. -Delirium precautions  DVT prophylaxis:  Advance Care Planning:   Code Status: Limited: Do not attempt resuscitation (DNR) -DNR-LIMITED -Do Not Intubate/DNI    Consults: none  Family Communication: Son updated at bedside  Severity of Illness: The appropriate patient status for this patient is INPATIENT. Inpatient status is judged to be reasonable and necessary in order to provide the required intensity of service to ensure the patient's safety. The patient's presenting symptoms, physical exam findings, and initial radiographic and laboratory data in the context of their chronic comorbidities is felt to place them at high risk for further clinical deterioration. Furthermore, it is not anticipated that the patient will be  medically stable for discharge from the hospital within 2 midnights of admission.   * I certify that at the point of admission it is my clinical judgment that the patient will require inpatient hospital care spanning beyond 2 midnights from the point of admission due to high intensity of service, high risk for further deterioration and high frequency of surveillance required.*  Author: Clydie Braun, MD 10/07/2023 10:17 AM  For on call review [email or web address].

## 2023-10-07 NOTE — Progress Notes (Signed)
Patient taken off Bipap at 1520 and placed on [place] 5L with humidity. Patient has no increased WOB at this time. Family at bedside.

## 2023-10-07 NOTE — ED Triage Notes (Signed)
Pt BIB [facility] from home for complaints of SOB and hypoxia per family of 65%. Pt currently taking antibiotics for pneumonia and arrived with indwelling foley cath. Pt was placed on non-rebreather at 12L with O2 sats 92% at time of triage.

## 2023-10-07 NOTE — Progress Notes (Signed)
RT unable to assess patient for possible aspiration or obtain a sputum sample due to patient currently wearing Bipap. Physician notified.

## 2023-10-07 NOTE — ED Provider Notes (Signed)
[place] EMERGENCY DEPARTMENT AT [facility] Provider Note   CSN: [id number] Arrival date & time: 10/07/23  0757     History  Chief Complaint  Patient presents with   Shortness of Breath    Douglas Walker is a 89 y.o. male.  89 year old male presenting with acute onset hypoxia and shortness of breath.  He was recently hospitalized and discharged home yesterday on 10/7 for pneumonia.  Son reports he was called into his dad room around 7 AM this morning when he was complaining of nausea and vomited once.  Son checked his vital signs and found him to be hypoxic to the 50s and called EMS.  When EMS arrived to the scene patient was on nonrebreather satting in the high 80s.  When they tried to take him off nonrebreather he desatted down to the low 70s.  Per prior admission notes, patient was discharged on room air and Augmentin antibiotic to treat his pneumonia.  Son reports that he was given 1 dose of the antibiotic at home.  Per his son, patient is DNR/DNI wanting to avoid ICU admission.  However he would like to pursue workup to treat the treatable.   Shortness of Breath Associated symptoms: vomiting   Associated symptoms: no abdominal pain, no chest pain, no cough and no fever        Home Medications Prior to Admission medications   Medication Sig Start Date End Date Taking? Authorizing Provider  acetaminophen (TYLENOL) 325 MG tablet Take 2 tablets (650 mg total) by mouth every 4 (four) hours as needed for mild pain (or temp > 37.5 C (99.5 F)). 02/14/21   Angiulli, Mcarthur Rossetti, PA-C  amoxicillin-clavulanate (AUGMENTIN) 500-125 MG tablet Take 1 tablet by mouth 2 (two) times daily for 5 days. 10/06/23 10/11/23  Arrien, York Ram, MD  apixaban (ELIQUIS) 2.5 MG TABS tablet Take 1 tablet (2.5 mg total) by mouth 2 (two) times daily. 09/24/22   Dunn, Tacey Ruiz, PA-C  Ascorbic Acid (VITAMIN C) 1000 MG tablet Take 1,000 mg by mouth daily.    [provider]   DULoxetine (CYMBALTA) 60 MG capsule Take 1 capsule (60 mg total) by mouth daily. 04/23/21   Lovorn, Aundra Millet, MD  furosemide (LASIX) 40 MG tablet Take 1 tablet (40 mg total) by mouth daily. 10/02/23   Christell Constant, MD  levothyroxine (SYNTHROID) 25 MCG tablet Take 1 tablet (25 mcg total) by mouth daily before breakfast. 02/14/21   Angiulli, Mcarthur Rossetti, PA-C  loratadine (CLARITIN) 10 MG tablet Take 10 mg by mouth daily.    [provider]  Multiple Vitamin (MULTIVITAMIN WITH MINERALS) TABS tablet Take 1 tablet by mouth daily.    [provider]  Multiple Vitamins-Minerals (OCUVITE ADULT 50+) CAPS Take 1 capsule by mouth daily.    [provider]  nitrofurantoin, macrocrystal-monohydrate, (MACROBID) 100 MG capsule Take 100 mg by mouth at bedtime. 09/15/23   [provider]  nystatin ointment (MYCOSTATIN) Apply topically 2 (two) times daily. Apply to rash over R face 08/12/23   Jerald Kief, MD  polyethylene glycol (MIRALAX / GLYCOLAX) 17 g packet Take 17 g by mouth daily. Patient taking differently: Take 17 g by mouth daily as needed for moderate constipation. 07/08/21   Narda Bonds, MD  pregabalin (LYRICA) 50 MG capsule Take 50 mg by mouth at bedtime.    [provider]  Topiramate (TOPAMAX PO) Take 1 tablet by mouth daily.    [provider]  traMADol (  ULTRAM) 50 MG tablet Take 1 tablet (50 mg total) by mouth 2 (two) times daily. 08/12/23   Jerald Kief, MD  vitamin B-12 (CYANOCOBALAMIN) 1000 MCG tablet Take 1 tablet (1,000 mcg total) by mouth every other day. 02/14/21   Angiulli, Mcarthur Rossetti, PA-C      Allergies    Sulfa antibiotics, Mysoline [primidone], and Zocor [simvastatin]    Review of Systems   Review of Systems  Constitutional:  Negative for appetite change, chills and fever.  Respiratory:  Positive for apnea and shortness of breath. Negative for cough.   Cardiovascular:  Negative for chest pain and leg swelling.   Gastrointestinal:  Positive for nausea and vomiting. Negative for abdominal pain.  Genitourinary:  Negative for decreased urine volume, dysuria and flank pain.    Physical Exam Updated Vital Signs BP (!) 150/60   Pulse 70   Temp (!) 101.4 F (38.6 C) (Oral)   Resp (!) 23   SpO2 94%  Physical Exam Constitutional:      Appearance: He is normal weight. He is ill-appearing.  Cardiovascular:     Rate and Rhythm: Normal rate and regular rhythm.  Pulmonary:     Effort: Tachypnea present.     Breath sounds: Decreased breath sounds present.  Skin:    Capillary Refill: Capillary refill takes 2 to 3 seconds.  Neurological:     Mental Status: He is alert. He is disoriented.     Cranial Nerves: No cranial nerve deficit.     Motor: No weakness.  Psychiatric:        Mood and Affect: Mood normal.        Behavior: Behavior normal.     ED Results / Procedures / Treatments   Labs (all labs ordered are listed, but only abnormal results are displayed) Labs Reviewed  CBC - Abnormal; Notable for the following components:      Result Value   WBC 18.1 (*)    RBC 3.86 (*)    Hemoglobin 11.9 (*)    HCT 36.0 (*)    RDW 16.0 (*)    Platelets 138 (*)    All other components within normal limits  COMPREHENSIVE METABOLIC PANEL - Abnormal; Notable for the following components:   Sodium 134 (*)    Chloride 95 (*)    Glucose, Bld 111 (*)    Creatinine, Ser 1.55 (*)    Albumin 3.1 (*)    Alkaline Phosphatase 127 (*)    GFR, Estimated 41 (*)    All other components within normal limits  BRAIN NATRIURETIC PEPTIDE - Abnormal; Notable for the following components:   B Natriuretic Peptide 249.6 (*)    All other components within normal limits  I-STAT VENOUS BLOOD GAS, ED - Abnormal; Notable for the following components:   pO2, Ven 23 (*)    Bicarbonate 28.4 (*)    HCT 38.0 (*)    Hemoglobin 12.9 (*)    All other components within normal limits  RESP PANEL BY RT-PCR (RSV, FLU A&B, COVID)   [id number]  CULTURE, BLOOD (ROUTINE X 2)  CULTURE, BLOOD (ROUTINE X 2)  URINE CULTURE  LACTIC ACID, PLASMA  LACTIC ACID, PLASMA  URINALYSIS, ROUTINE W REFLEX MICROSCOPIC    EKG EKG Interpretation Date/Time:  Tuesday October 07 2023 08:15:55 EDT Ventricular Rate:  70 PR Interval:  40 QRS Duration:  124 QT Interval:  408 QTC Calculation: 441 R Axis:   -50  Text Interpretation: Failure to sense and/or capture (?magnet) Sinus or  ectopic atrial rhythm Short PR interval Consider right atrial enlargement RBBB and LAFB ST elevation, consider inferior injury Artifact in lead(s) I II aVR aVL aVF No significant change since last tracing Confirmed by Jacalyn Lefevre [phone]) on 10/07/2023 9:44:47 AM  Radiology DG CHEST PORT 1 VIEW  Result Date: 10/07/2023 CLINICAL DATA:  Shortness of breath. EXAM: PORTABLE CHEST 1 VIEW COMPARISON:  10/03/2023. FINDINGS: Redemonstration of diffuse increased interstitial markings with hilar and bibasilar predominance. There is probable slight interval worsening since the prior exam. There are bilateral small layering pleural effusions, left-greater-than-right. There is also right fissural effusion. No pneumothorax. Stable cardio-mediastinal silhouette. There is a left sided 2-lead pacemaker. No acute osseous abnormalities. The soft tissues are within normal limits. IMPRESSION: *Slight interval worsening of congestive heart failure/pulmonary edema. Correlate clinically to exclude superimposed pneumonia. Electronically Signed   By: Jules Schick M.D.   On: 10/07/2023 08:35    Procedures Procedures    Medications Ordered in ED Medications  cefTRIAXone (ROCEPHIN) 1 g in sodium chloride 0.9 % 100 mL IVPB (has no administration in time range)  azithromycin (ZITHROMAX) 500 mg in sodium chloride 0.9 % 250 mL IVPB (has no administration in time range)  acetaminophen (TYLENOL) suppository 650 mg (has no administration in time range)  furosemide (LASIX) injection 40 mg (40 mg  Intravenous Given 10/07/23 0912)    ED Course/ Medical Decision Making/ A&P                                 Medical Decision Making 89 year old male presenting with acute hypoxia.  On arrival vital signs significant for oxygen saturation high 80s on nonrebreather.  At home he was previously on room air.  On exam he is disoriented, extremely hard of hearing, with bilateral crackles in the lung bases.  He is able to follow commands however he does not know his name or current location which appears to be close to his baseline with dementia.  Differential for acute hypoxia includes worsening pneumonia, pulmonary edema secondary to volume overload, aortic dissection, aspiration.  Will begin workup with chest x-ray, VBG, CBC, CMP, viral testing, lactic acid.   Chest x-ray resulted with interval worsening of multifocal pneumonia and bilateral pleural effusions.  Placed patient on BiPAP due to hypoxia and pulmonary edema and given 40 mg IV Lasix.  VBG resulting with normal pH.  CBC showing increased leukocytosis from 10/7.  Blood cultures drawn 4 days ago showing no growth.  CMP showing slight worsening of kidney function from discharge without severe electrolyte derangement.  Patient meets SIRS criteria with leukocytosis and fever.  Will obtain blood cultures, UA and urine culture once Foley is exchanged, and start IV ceftriaxone and azithromycin.  Will plan to admit to hospital service for further management of acute hypoxia.  Amount and/or Complexity of Data Reviewed Independent Historian: caregiver    Details: Healthcare power of attorney son Brett Canales External Data Reviewed: labs, radiology and notes. Labs: ordered. Decision-making details documented in ED Course. Radiology: ordered. Decision-making details documented in ED Course. ECG/medicine tests: ordered. Decision-making details documented in ED Course.  Risk OTC drugs. Prescription drug management. Decision regarding  hospitalization.         Final Clinical Impression(s) / ED Diagnoses Final diagnoses:  Acute respiratory distress    Rx / DC Orders ED Discharge Orders     None         Glendale Chard, DO 10/07/23 1020  Jacalyn Lefevre, MD 10/07/23 1114

## 2023-10-08 ENCOUNTER — Encounter (HOSPITAL_COMMUNITY): Payer: Self-pay | Admitting: Internal Medicine

## 2023-10-08 DIAGNOSIS — J9601 Acute respiratory failure with hypoxia: Secondary | ICD-10-CM | POA: Diagnosis not present

## 2023-10-08 LAB — CULTURE, BLOOD (ROUTINE X 2): Culture: NO GROWTH

## 2023-10-08 LAB — BASIC METABOLIC PANEL
Anion gap: 10 (ref 5–15)
BUN: 25 mg/dL — ABNORMAL HIGH (ref 8–23)
CO2: 26 mmol/L (ref 22–32)
Calcium: 8.8 mg/dL — ABNORMAL LOW (ref 8.9–10.3)
Chloride: 102 mmol/L (ref 98–111)
Creatinine, Ser: 1.73 mg/dL — ABNORMAL HIGH (ref 0.61–1.24)
GFR, Estimated: 36 mL/min — ABNORMAL LOW (ref >60)
Glucose, Bld: 100 mg/dL — ABNORMAL HIGH (ref 70–99)
Potassium: 3.6 mmol/L (ref 3.5–5.1)
Sodium: 138 mmol/L (ref 135–145)

## 2023-10-08 LAB — CBC
HCT: 28.8 % — ABNORMAL LOW (ref 39.0–52.0)
Hemoglobin: 9.3 g/dL — ABNORMAL LOW (ref 13.0–17.0)
MCH: 30 pg (ref 26.0–34.0)
MCHC: 32.3 g/dL (ref 30.0–36.0)
MCV: 92.9 fL (ref 80.0–100.0)
Platelets: 117 10*3/uL — ABNORMAL LOW (ref 150–400)
RBC: 3.1 MIL/uL — ABNORMAL LOW (ref 4.22–5.81)
RDW: 16.1 % — ABNORMAL HIGH (ref 11.5–15.5)
WBC: 13 10*3/uL — ABNORMAL HIGH (ref 4.0–10.5)
nRBC: 0 % (ref 0.0–0.2)

## 2023-10-08 LAB — LEGIONELLA PNEUMOPHILA SEROGP 1 UR AG: L. pneumophila Serogp 1 Ur Ag: NEGATIVE

## 2023-10-08 LAB — STREP PNEUMONIAE URINARY ANTIGEN: Strep Pneumo Urinary Antigen: NEGATIVE

## 2023-10-08 LAB — GLUCOSE, CAPILLARY: Glucose-Capillary: 99 mg/dL (ref 70–99)

## 2023-10-08 MED ORDER — CHLORHEXIDINE GLUCONATE CLOTH 2 % EX PADS
6.0000 | MEDICATED_PAD | Freq: Every day | CUTANEOUS | Status: DC
  Administered 2023-10-08 – 2023-10-15 (×8): 6 via TOPICAL

## 2023-10-08 NOTE — Progress Notes (Signed)
FYI Notified by CCMD Pt is pacing on his QRS. Pt was also doing this during day shift. Notified doctor.

## 2023-10-08 NOTE — Progress Notes (Signed)
PROGRESS NOTE   Douglas Walker, is a 89 y.o. male, DOB - September 25, 1925, YQM:[id number]  Admit date - 10/07/2023   Admitting Physician Clydie Braun, MD  Outpatient Primary MD for the patient is Georgann Housekeeper, MD  LOS - 1  Chief Complaint  Patient presents with   Shortness of Breath      Brief Narrative:  89 y.o. male with medical history significant of hypertension, hyperlipidemia, paroxysmal atrial fibrillation on Eliquis, heart failure with preserved EF (50 to 55%, tachybradycardia syndrome s/p PPM, CKD stage III, pancytopenia, hypothyroidism, essential tremor, recurrent UTIs on Macrobid, BPH with chronic indwelling Foley catheter, and dementia admitted on 10/07/2023 with acute hypoxic respiratory failure secondary to sepsis due to pneumonia    -Assessment and Plan: 1) sepsis secondary to pneumonia and possible UTI--POA -Urine Legionella antigen negative -Currently on Zosyn and azithromycin pending culture data WBC 18.1 >> 13.0  2) acute hypoxic respiratory failure--initially required BiPAP -Currently weaned down to 5 L of oxygen via nasal cannula  3)??CAUTI--history of recurrent UTIs, with chronic indwelling Foley -Patient came here with Foley -Recent urine culture from 10/03/2023 with Morganella morganii and Proteus---both were sensitive to Zosyn -Zosyn as above pending urine and blood culture culture data  4)Paroxysmal atrial fibrillation on chronic anticoagulation Tachybradycardia syndrome s/p PPM -Patient is not on AV nodal blocking agents -Continue apixaban for stroke prophylaxis  5) chronic anemia/thrombocytopenia--indices appears close to recent baseline -No bleeding concerns continue to monitor  6) hypothyroidism--continue levothyroxine  7) dementia--- reorient, supportive care, delirium precautions  8) chronic pain syndrome--- continue Cymbalta, may use tramadol as needed  Status is: Inpatient   Disposition: The patient is from: Home               Anticipated d/c is to: SNF              Anticipated d/c date is: 2 days              Patient currently is not medically stable to d/c. Barriers: Not Clinically Stable-   Code Status :  -  Code Status: Limited: Do not attempt resuscitation (DNR) -DNR-LIMITED -Do Not Intubate/DNI    Family Communication:   None at bedside, son Brett Canales is primary contact  DVT Prophylaxis  :   - SCDs  apixaban (ELIQUIS) tablet 2.5 mg Start: 10/07/23 2200 apixaban (ELIQUIS) tablet 2.5 mg   Lab Results  Component Value Date   PLT 117 (L) 10/08/2023    Inpatient Medications  Scheduled Meds:  apixaban  2.5 mg Oral BID   Chlorhexidine Gluconate Cloth  6 each Topical Daily   DULoxetine  60 mg Oral Daily   levothyroxine  25 mcg Oral QAC breakfast   nystatin ointment  1 Application Topical BID   sodium chloride flush  3 mL Intravenous Q12H   traMADol  50 mg Oral BID   Continuous Infusions:  sodium chloride 75 mL/hr at 10/07/23 1615   azithromycin Stopped (10/08/23 1318)   piperacillin-tazobactam (ZOSYN)  IV Stopped (10/08/23 1049)   PRN Meds:.acetaminophen **OR** acetaminophen, albuterol   Anti-infectives (From admission, onward)    Start     Dose/Rate Route Frequency Ordered Stop   10/08/23 1000  cefTRIAXone (ROCEPHIN) 2 g in sodium chloride 0.9 % 100 mL IVPB  Status:  Discontinued        2 g 200 mL/hr over 30 Minutes Intravenous Every 24 hours 10/07/23 1045 10/07/23 1117   10/08/23 1000  azithromycin (ZITHROMAX) 500 mg in sodium chloride 0.9 % 250  mL IVPB        500 mg 250 mL/hr over 60 Minutes Intravenous Every 24 hours 10/07/23 1104 10/12/23 0959   10/07/23 2200  piperacillin-tazobactam (ZOSYN) IVPB 3.375 g        3.375 g 12.5 mL/hr over 240 Minutes Intravenous Every 8 hours 10/07/23 1121     10/07/23 1130  piperacillin-tazobactam (ZOSYN) IVPB 3.375 g        3.375 g 100 mL/hr over 30 Minutes Intravenous Once 10/07/23 1118 10/07/23 1311   10/07/23 1100  cefTRIAXone (ROCEPHIN) 1 g in sodium  chloride 0.9 % 100 mL IVPB  Status:  Discontinued       Note to Pharmacy: Should have been 2 g   1 g 200 mL/hr over 30 Minutes Intravenous  Once 10/07/23 1045 10/07/23 1117   10/07/23 0930  cefTRIAXone (ROCEPHIN) 1 g in sodium chloride 0.9 % 100 mL IVPB        1 g 200 mL/hr over 30 Minutes Intravenous  Once 10/07/23 0919 10/07/23 1136   10/07/23 0930  azithromycin (ZITHROMAX) 500 mg in sodium chloride 0.9 % 250 mL IVPB        500 mg 250 mL/hr over 60 Minutes Intravenous  Once 10/07/23 0919 10/07/23 1242         Subjective: Douglas Walker today has no fevers, no emesis,  No chest pain,   - Pleasantly confused and disoriented   Objective: Vitals:   10/08/23 1115 10/08/23 1237 10/08/23 1342 10/08/23 1516  BP: (!) 140/64  (!) 122/58 130/60  Pulse: 69  71   Resp: 19  (!) 22 (!) 22  Temp:  98.8 F (37.1 C) 98.6 F (37 C) 98.6 F (37 C)  TempSrc:  Oral Oral Oral  SpO2: 98%  100%     Intake/Output Summary (Last 24 hours) at 10/08/2023 1859 Last data filed at 10/08/2023 0210 Gross per 24 hour  Intake 50 ml  Output --  Net 50 ml   Physical Exam  Gen:- Awake Alert, not very cooperative with care at times HEENT:- [place].AT, No sclera icterus Ears--HOH Nose-[place] 5L/min Neck-Supple Neck,No JVD,.  Lungs-diminished breath sounds, bibasilar rales/rhonchi CV- S1, S2 normal, regular  Abd-  +ve B.Sounds, Abd Soft, No tenderness,    Extremity- pedal pulses present  Psych-affect is appropriate, oriented x3 Neuro-generalized weakness , no new focal deficits, no tremors GU-Foley with clear urine  Data Reviewed: I have personally reviewed following labs and imaging studies  CBC: Recent Labs  Lab 10/03/23 1430 10/04/23 0303 10/05/23 0242 10/07/23 0817 10/07/23 0823 10/08/23 0431  WBC 10.9* 7.7 5.7 18.1*  --  13.0*  NEUTROABS 8.4*  --  3.9  --   --   --   HGB 9.6* 9.5* 9.5* 11.9* 12.9* 9.3*  HCT 29.0* 28.8* 28.7* 36.0* 38.0* 28.8*  MCV 90.9 92.9 92.9 93.3  --  92.9  PLT 106*  100* 101* 138*  --  117*   Basic Metabolic Panel: Recent Labs  Lab 10/04/23 0303 10/05/23 0242 10/06/23 0323 10/07/23 0817 10/07/23 0823 10/08/23 0431  NA 137 137 134* 134* 139 138  K 3.9 3.7 3.7 3.6 3.8 3.6  CL 96* 97* 96* 95*  --  102  CO2 30 31 30 26   --  26  GLUCOSE 100* 91 90 111*  --  100*  BUN 21 19 19 23   --  25*  CREATININE 1.51* 1.53* 1.48* 1.55*  --  1.73*  CALCIUM 8.7* 8.7* 8.7* 9.3  --  8.8*  MG 2.1  --   --   --   --   --    GFR: Estimated Creatinine Clearance: 25 mL/min (A) (by C-G formula based on SCr of 1.73 mg/dL (H)). Liver Function Tests: Recent Labs  Lab 10/03/23 1430 10/07/23 0817  AST 30 24  ALT 26 20  ALKPHOS 155* 127*  BILITOT 0.8 1.0  PROT 7.1 8.1  ALBUMIN 2.9* 3.1*   BNP (last 3 results) Recent Labs    02/04/23 1619 08/26/23 1526  PROBNP 1,402* 1,981*   Recent Results (from the past 240 hour(s))  Blood Culture (routine x 2)     Status: None   Collection Time: 10/03/23  2:55 PM   Specimen: BLOOD  Result Value Ref Range Status   Specimen Description BLOOD SITE NOT SPECIFIED  Final   Special Requests   Final    BOTTLES DRAWN AEROBIC AND ANAEROBIC Blood Culture results may not be optimal due to an excessive volume of blood received in culture bottles   Culture   Final    NO GROWTH 5 DAYS Performed at [facility], [address] [place], [place] [address]    Report Status 10/08/2023 FINAL  Final  SARS Coronavirus 2 by RT PCR (hospital order, performed in [facility] hospital lab) *cepheid single result test* Anterior Nasal Swab     Status: None   Collection Time: 10/03/23  3:24 PM   Specimen: Anterior Nasal Swab  Result Value Ref Range Status   SARS Coronavirus 2 by RT PCR NEGATIVE NEGATIVE Final    Comment: Performed at [facility], [address], [place], [place] [address]  Blood Culture (routine x 2)     Status: None   Collection Time: 10/03/23  3:30 PM   Specimen: BLOOD  Result Value Ref Range Status   Specimen  Description BLOOD SITE NOT SPECIFIED  Final   Special Requests   Final    BOTTLES DRAWN AEROBIC AND ANAEROBIC Blood Culture results may not be optimal due to an excessive volume of blood received in culture bottles   Culture   Final    NO GROWTH 5 DAYS Performed at [facility], [address], [place], [place] [address]    Report Status 10/08/2023 FINAL  Final  Urine Culture     Status: Abnormal   Collection Time: 10/03/23  3:30 PM   Specimen: Urine, Catheterized  Result Value Ref Range Status   Specimen Description URINE, CATHETERIZED  Final   Special Requests   Final    NONE Reflexed from [id number] Performed at [facility], [address], [place], [place] [address]    Culture (A)  Final    >=100,000 COLONIES/mL PROTEUS MIRABILIS >=100,000 COLONIES/mL MORGANELLA MORGANII    Report Status 10/06/2023 FINAL  Final   Organism ID, Bacteria PROTEUS MIRABILIS (A)  Final   Organism ID, Bacteria MORGANELLA MORGANII (A)  Final      Susceptibility   Morganella morganii - MIC*    AMPICILLIN >=32 RESISTANT Resistant     CIPROFLOXACIN <=0.25 SENSITIVE Sensitive     GENTAMICIN <=1 SENSITIVE Sensitive     IMIPENEM 2 SENSITIVE Sensitive     NITROFURANTOIN >=512 RESISTANT Resistant     TRIMETH/SULFA <=20 SENSITIVE Sensitive     AMPICILLIN/SULBACTAM >=32 RESISTANT Resistant     PIP/TAZO <=4 SENSITIVE Sensitive ug/mL    * >=100,000 COLONIES/mL MORGANELLA MORGANII   Proteus mirabilis - MIC*    AMPICILLIN <=2 SENSITIVE Sensitive  CEFAZOLIN <=4 SENSITIVE Sensitive     CEFEPIME <=0.12 SENSITIVE Sensitive     CEFTRIAXONE <=0.25 SENSITIVE Sensitive     CIPROFLOXACIN <=0.25 SENSITIVE Sensitive     GENTAMICIN <=1 SENSITIVE Sensitive     IMIPENEM 2 SENSITIVE Sensitive     NITROFURANTOIN 128 RESISTANT Resistant     TRIMETH/SULFA <=20 SENSITIVE Sensitive     AMPICILLIN/SULBACTAM <=2 SENSITIVE Sensitive     PIP/TAZO <=4 SENSITIVE Sensitive ug/mL    * >=100,000 COLONIES/mL PROTEUS  MIRABILIS  Resp panel by RT-PCR (RSV, Flu A&B, Covid) Anterior Nasal Swab     Status: None   Collection Time: 10/07/23  8:33 AM   Specimen: Anterior Nasal Swab  Result Value Ref Range Status   SARS Coronavirus 2 by RT PCR NEGATIVE NEGATIVE Final   Influenza A by PCR NEGATIVE NEGATIVE Final   Influenza B by PCR NEGATIVE NEGATIVE Final    Comment: (NOTE) The Xpert Xpress SARS-CoV-2/FLU/RSV plus assay is intended as an aid in the diagnosis of influenza from Nasopharyngeal swab specimens and should not be used as a sole basis for treatment. Nasal washings and aspirates are unacceptable for Xpert Xpress SARS-CoV-2/FLU/RSV testing.  Fact Sheet for Patients: BloggerCourse.com  Fact Sheet for Healthcare Providers: SeriousBroker.it  This test is not yet approved or cleared by the [place] FDA and has been authorized for detection and/or diagnosis of SARS-CoV-2 by FDA under an Emergency Use Authorization (EUA). This EUA will remain in effect (meaning this test can be used) for the duration of the COVID-19 declaration under Section 564(b)(1) of the Act, 21 U.S.C. section 360bbb-3(b)(1), unless the authorization is terminated or revoked.     Resp Syncytial Virus by PCR NEGATIVE NEGATIVE Final    Comment: (NOTE) Fact Sheet for Patients: BloggerCourse.com  Fact Sheet for Healthcare Providers: SeriousBroker.it  This test is not yet approved or cleared by the [place] FDA and has been authorized for detection and/or diagnosis of SARS-CoV-2 by FDA under an Emergency Use Authorization (EUA). This EUA will remain in effect (meaning this test can be used) for the duration of the COVID-19 declaration under Section 564(b)(1) of the Act, 21 U.S.C. section 360bbb-3(b)(1), unless the authorization is terminated or revoked.  Performed at [facility], [address], [place],  [place] [address]   Blood culture (routine x 2)     Status: None (Preliminary result)   Collection Time: 10/07/23  9:18 AM   Specimen: BLOOD  Result Value Ref Range Status   Specimen Description BLOOD LEFT ANTECUBITAL  Final   Special Requests   Final    BOTTLES DRAWN AEROBIC ONLY Blood Culture adequate volume   Culture   Final    NO GROWTH < 24 HOURS Performed at [facility], [address] [place], [place] [address]    Report Status PENDING  Incomplete  Blood culture (routine x 2)     Status: None (Preliminary result)   Collection Time: 10/07/23  9:23 AM   Specimen: BLOOD  Result Value Ref Range Status   Specimen Description BLOOD RIGHT ANTECUBITAL  Final   Special Requests   Final    BOTTLES DRAWN AEROBIC AND ANAEROBIC Blood Culture adequate volume   Culture   Final    NO GROWTH < 24 HOURS Performed at [facility], [address], [place], [place] [address]    Report Status PENDING  Incomplete    Radiology Studies: DG CHEST PORT 1 VIEW  Result Date: 10/07/2023 CLINICAL DATA:  Shortness of breath. EXAM: PORTABLE CHEST 1 VIEW COMPARISON:  10/03/2023. FINDINGS: Redemonstration of diffuse increased interstitial markings with hilar and bibasilar predominance. There is probable slight interval worsening since the prior exam. There are bilateral small layering pleural effusions, left-greater-than-right. There is also right fissural effusion. No pneumothorax. Stable cardio-mediastinal silhouette. There is a left sided 2-lead pacemaker. No acute osseous abnormalities. The soft tissues are within normal limits. IMPRESSION: *Slight interval worsening of congestive heart failure/pulmonary edema. Correlate clinically to exclude superimposed pneumonia. Electronically Signed   By: Jules Schick M.D.   On: 10/07/2023 08:35    Scheduled Meds:  apixaban  2.5 mg Oral BID   Chlorhexidine Gluconate Cloth  6 each Topical Daily   DULoxetine  60 mg Oral Daily   levothyroxine  25 mcg Oral QAC  breakfast   nystatin ointment  1 Application Topical BID   sodium chloride flush  3 mL Intravenous Q12H   traMADol  50 mg Oral BID   Continuous Infusions:  sodium chloride 75 mL/hr at 10/07/23 1615   azithromycin Stopped (10/08/23 1318)   piperacillin-tazobactam (ZOSYN)  IV Stopped (10/08/23 1049)    LOS: 1 day   Shon Hale M.D on 10/08/2023 at 6:59 PM  Go to [email or web address] - for contact info  [facility] - Office  [phone]  If 7PM-7AM, please contact night-coverage [email or web address] 10/08/2023, 6:59 PM  \

## 2023-10-08 NOTE — Plan of Care (Signed)
Patient has been resting comfortable. No complaints of SOB or Chest Pain. Will turn q2h and will continue to monitor patient.

## 2023-10-08 NOTE — ED Notes (Signed)
Attempted to obtain pt's temp, pt stated repeatedly that he wanted to be left alone.

## 2023-10-09 DIAGNOSIS — J9601 Acute respiratory failure with hypoxia: Secondary | ICD-10-CM | POA: Diagnosis not present

## 2023-10-09 LAB — URINE CULTURE: Culture: <10000 — AB

## 2023-10-09 MED ORDER — VITAMIN B-12 1000 MCG PO TABS
1000.0000 ug | ORAL_TABLET | ORAL | Status: DC
  Administered 2023-10-09 – 2023-10-15 (×4): 1000 ug via ORAL
  Filled 2023-10-09 (×4): qty 1

## 2023-10-09 MED ORDER — PREGABALIN 50 MG PO CAPS
50.0000 mg | ORAL_CAPSULE | Freq: Every day | ORAL | Status: DC
  Administered 2023-10-09 – 2023-10-14 (×6): 50 mg via ORAL
  Filled 2023-10-09 (×6): qty 1

## 2023-10-09 MED ORDER — POLYETHYLENE GLYCOL 3350 17 G PO PACK
17.0000 g | PACK | Freq: Every day | ORAL | Status: DC
  Administered 2023-10-09 – 2023-10-15 (×7): 17 g via ORAL
  Filled 2023-10-09 (×7): qty 1

## 2023-10-09 MED ORDER — FUROSEMIDE 40 MG PO TABS
40.0000 mg | ORAL_TABLET | Freq: Every day | ORAL | Status: DC
  Administered 2023-10-09 – 2023-10-15 (×7): 40 mg via ORAL
  Filled 2023-10-09 (×7): qty 1

## 2023-10-09 NOTE — Care Management Important Message (Signed)
Important Message  Patient Details  Name: Douglas Walker MRN: [id number] Date of Birth: Mar 28, 1925   Important Message Given:  Other (see comment)     Sherilyn Banker 10/09/2023, 4:03 PM

## 2023-10-09 NOTE — Progress Notes (Addendum)
PROGRESS NOTE    Douglas Walker  ZOX:[id number] DOB: 1925-02-10 DOA: 10/07/2023 PCP: Georgann Housekeeper, MD   Brief Narrative:  89 y.o. male with medical history significant of hypertension, hyperlipidemia, paroxysmal atrial fibrillation on Eliquis, heart failure with preserved EF (50 to 55%, tachybradycardia syndrome s/p PPM, CKD stage III, pancytopenia, hypothyroidism, essential tremor, recurrent UTIs on Macrobid, BPH with chronic indwelling Foley catheter, and dementia admitted on 10/07/2023 with acute hypoxic respiratory failure secondary to sepsis due to pneumonia   Assessment & Plan:   Principal Problem:   Acute respiratory failure with hypoxia (HCC) Active Problems:   Sepsis due to pneumonia (HCC)   Chronic heart failure with preserved ejection fraction (HCC)   Chronic indwelling Foley catheter   Complicated urinary tract infection   Paroxysmal atrial fibrillation (HCC)   Chronic kidney disease, stage III (moderate) (HCC)   Dysphagia, post-stroke   Pancytopenia (HCC)   Hypothyroidism   Chronic pain   Dementia (HCC)  1) severe sepsis secondary to pneumonia and possible UTI--POA: Patient met criteria for severe sepsis based on fever 101.4, tachypnea and leukocytosis as well as acute hypoxic respiratory failure. Urine Legionella antigen negative -Currently on Zosyn and azithromycin pending culture data which is negative so far.  Leukocytosis is improving.  He is afebrile since admission.    2) acute hypoxic respiratory failure secondary to community-acquired pneumonia--initially required BiPAP -Currently weaned down to 5 L of oxygen via nasal cannula, saturating 98%, should wean further.  RN notified.   3)??CAUTI--history of recurrent UTIs, with chronic indwelling Foley -Patient came here with Foley -Recent urine culture from 10/03/2023 with Morganella morganii and Proteus---both were sensitive to Zosyn as well as ciprofloxacin.  However I am going to continue Zosyn for now.    4)Paroxysmal atrial fibrillation on chronic anticoagulation Tachybradycardia syndrome s/p PPM -Patient is not on AV nodal blocking agents -Continue apixaban for stroke prophylaxis   5) chronic anemia/thrombocytopenia--indices appears close to recent baseline -No bleeding concerns continue to monitor   6) hypothyroidism--continue levothyroxine   7) dementia--- patient very hard of hearing, unsure if he is aware where he is at.  Hard to communicate.  Reorient, supportive care, delirium precautions   8) chronic pain syndrome--- continue Cymbalta, may use tramadol as needed  Acute on chronic congestive heart failure with midrange ejection fraction: Echo done just 5 days ago shows EF of 50 to 55%, chest x-ray shows pulmonary edema but BNP almost at his baseline.  I did not hear any crackles.  Resume home dose of Lasix.  CKD stage IIIb: At baseline, creatinine 1.5-1.7.  DVT prophylaxis: apixaban (ELIQUIS) tablet 2.5 mg Start: 10/07/23 2200   Code Status: Limited: Do not attempt resuscitation (DNR) -DNR-LIMITED -Do Not Intubate/DNI   Family Communication:  None present at bedside.    Status is: Inpatient Remains inpatient appropriate because: Patient still on high amount of oxygen.   Estimated body mass index is 26.47 kg/m as calculated from the following:   Height as of 10/04/23: 5\' 9"  (1.753 m).   Weight as of this encounter: 81.3 kg.  Pressure Injury 07/30/23 Sacrum Stage 2 -  Partial thickness loss of dermis presenting as a shallow open injury with a red, pink wound bed without slough. (Active)  07/30/23 1156  Location: Sacrum  Location Orientation:   Staging: Stage 2 -  Partial thickness loss of dermis presenting as a shallow open injury with a red, pink wound bed without slough.  Wound Description (Comments):   Present on Admission: Yes  Dressing Type Foam - Lift dressing to assess site every shift 10/06/23 0958     Pressure Injury 07/30/23 Hip Right Unstageable - Full  thickness tissue loss in which the base of the injury is covered by slough (yellow, tan, gray, green or brown) and/or eschar (tan, brown or black) in the wound bed. (Active)  07/30/23 1156  Location: Hip  Location Orientation: Right  Staging: Unstageable - Full thickness tissue loss in which the base of the injury is covered by slough (yellow, tan, gray, green or brown) and/or eschar (tan, brown or black) in the wound bed.  Wound Description (Comments):   Present on Admission: Yes  Dressing Type Foam - Lift dressing to assess site every shift 10/06/23 0958     Pressure Injury 10/03/23 Vertebral column Lower Deep Tissue Pressure Injury - Purple or maroon localized area of discolored intact skin or blood-filled blister due to damage of underlying soft tissue from pressure and/or shear. Area of purplish discolora (Active)  10/03/23 2059  Location: Vertebral column  Location Orientation: Lower  Staging: Deep Tissue Pressure Injury - Purple or maroon localized area of discolored intact skin or blood-filled blister due to damage of underlying soft tissue from pressure and/or shear.  Wound Description (Comments): Area of purplish discoloration above stage 2 on sacrum  Present on Admission: Yes  Dressing Type Foam - Lift dressing to assess site every shift 10/06/23 0958   Nutritional Assessment: Body mass index is 26.47 kg/m.Marland Kitchen Seen by dietician.  I agree with the assessment and plan as outlined below: Nutrition Status:        . Skin Assessment: I have examined the patient's skin and I agree with the wound assessment as performed by the wound care RN as outlined below: Pressure Injury 07/30/23 Sacrum Stage 2 -  Partial thickness loss of dermis presenting as a shallow open injury with a red, pink wound bed without slough. (Active)  07/30/23 1156  Location: Sacrum  Location Orientation:   Staging: Stage 2 -  Partial thickness loss of dermis presenting as a shallow open injury with a red, pink  wound bed without slough.  Wound Description (Comments):   Present on Admission: Yes  Dressing Type Foam - Lift dressing to assess site every shift 10/06/23 0958     Pressure Injury 07/30/23 Hip Right Unstageable - Full thickness tissue loss in which the base of the injury is covered by slough (yellow, tan, gray, green or brown) and/or eschar (tan, brown or black) in the wound bed. (Active)  07/30/23 1156  Location: Hip  Location Orientation: Right  Staging: Unstageable - Full thickness tissue loss in which the base of the injury is covered by slough (yellow, tan, gray, green or brown) and/or eschar (tan, brown or black) in the wound bed.  Wound Description (Comments):   Present on Admission: Yes  Dressing Type Foam - Lift dressing to assess site every shift 10/06/23 0958     Pressure Injury 10/03/23 Vertebral column Lower Deep Tissue Pressure Injury - Purple or maroon localized area of discolored intact skin or blood-filled blister due to damage of underlying soft tissue from pressure and/or shear. Area of purplish discolora (Active)  10/03/23 2059  Location: Vertebral column  Location Orientation: Lower  Staging: Deep Tissue Pressure Injury - Purple or maroon localized area of discolored intact skin or blood-filled blister due to damage of underlying soft tissue from pressure and/or shear.  Wound Description (Comments): Area of purplish discoloration above stage 2 on sacrum  Present  on Admission: Yes  Dressing Type Foam - Lift dressing to assess site every shift 10/06/23 0958    Consultants:  None  Procedures:  None  Antimicrobials:  Anti-infectives (From admission, onward)    Start     Dose/Rate Route Frequency Ordered Stop   10/08/23 1000  cefTRIAXone (ROCEPHIN) 2 g in sodium chloride 0.9 % 100 mL IVPB  Status:  Discontinued        2 g 200 mL/hr over 30 Minutes Intravenous Every 24 hours 10/07/23 1045 10/07/23 1117   10/08/23 1000  azithromycin (ZITHROMAX) 500 mg in sodium  chloride 0.9 % 250 mL IVPB        500 mg 250 mL/hr over 60 Minutes Intravenous Every 24 hours 10/07/23 1104 10/12/23 0959   10/07/23 2200  piperacillin-tazobactam (ZOSYN) IVPB 3.375 g        3.375 g 12.5 mL/hr over 240 Minutes Intravenous Every 8 hours 10/07/23 1121     10/07/23 1130  piperacillin-tazobactam (ZOSYN) IVPB 3.375 g        3.375 g 100 mL/hr over 30 Minutes Intravenous Once 10/07/23 1118 10/07/23 1311   10/07/23 1100  cefTRIAXone (ROCEPHIN) 1 g in sodium chloride 0.9 % 100 mL IVPB  Status:  Discontinued       Note to Pharmacy: Should have been 2 g   1 g 200 mL/hr over 30 Minutes Intravenous  Once 10/07/23 1045 10/07/23 1117   10/07/23 0930  cefTRIAXone (ROCEPHIN) 1 g in sodium chloride 0.9 % 100 mL IVPB        1 g 200 mL/hr over 30 Minutes Intravenous  Once 10/07/23 0919 10/07/23 1136   10/07/23 0930  azithromycin (ZITHROMAX) 500 mg in sodium chloride 0.9 % 250 mL IVPB        500 mg 250 mL/hr over 60 Minutes Intravenous  Once 10/07/23 0919 10/07/23 1242         Subjective: Patient seen and examined.  Extremely hard of hearing which makes communication very challenging.  Denied any shortness of breath and appears comfortable.  Objective: Vitals:   10/08/23 1940 10/08/23 2318 10/09/23 0420 10/09/23 0726  BP: 125/61 121/60 132/66 139/68  Pulse: 70 70 70   Resp: 16 15 18  (!) 22  Temp: 97.8 F (36.6 C) 98.8 F (37.1 C)    TempSrc: Oral Oral    SpO2: 96% 95% 91%   Weight:   81.3 kg     Intake/Output Summary (Last 24 hours) at 10/09/2023 0846 Last data filed at 10/09/2023 0512 Gross per 24 hour  Intake 702.33 ml  Output 425 ml  Net 277.33 ml   Filed Weights   10/09/23 0420  Weight: 81.3 kg    Examination:  General exam: Appears calm and comfortable  Respiratory system: Clear to auscultation. Respiratory effort normal. Cardiovascular system: S1 & S2 heard, RRR. No JVD, murmurs, rubs, gallops or clicks. No pedal edema. Gastrointestinal system: Abdomen is  nondistended, soft and nontender. No organomegaly or masses felt. Normal bowel sounds heard. Central nervous system: Alert but oriented to self only.  No focal deficit. Extremities: Symmetric 5 x 5 power. Skin: No rashes, lesions or ulcers  Data Reviewed: I have personally reviewed following labs and imaging studies  CBC: Recent Labs  Lab 10/03/23 1430 10/04/23 0303 10/05/23 0242 10/07/23 0817 10/07/23 0823 10/08/23 0431  WBC 10.9* 7.7 5.7 18.1*  --  13.0*  NEUTROABS 8.4*  --  3.9  --   --   --   HGB 9.6* 9.5* 9.5*  11.9* 12.9* 9.3*  HCT 29.0* 28.8* 28.7* 36.0* 38.0* 28.8*  MCV 90.9 92.9 92.9 93.3  --  92.9  PLT 106* 100* 101* 138*  --  117*   Basic Metabolic Panel: Recent Labs  Lab 10/04/23 0303 10/05/23 0242 10/06/23 0323 10/07/23 0817 10/07/23 0823 10/08/23 0431  NA 137 137 134* 134* 139 138  K 3.9 3.7 3.7 3.6 3.8 3.6  CL 96* 97* 96* 95*  --  102  CO2 30 31 30 26   --  26  GLUCOSE 100* 91 90 111*  --  100*  BUN 21 19 19 23   --  25*  CREATININE 1.51* 1.53* 1.48* 1.55*  --  1.73*  CALCIUM 8.7* 8.7* 8.7* 9.3  --  8.8*  MG 2.1  --   --   --   --   --    GFR: Estimated Creatinine Clearance: 25 mL/min (A) (by C-G formula based on SCr of 1.73 mg/dL (H)). Liver Function Tests: Recent Labs  Lab 10/03/23 1430 10/07/23 0817  AST 30 24  ALT 26 20  ALKPHOS 155* 127*  BILITOT 0.8 1.0  PROT 7.1 8.1  ALBUMIN 2.9* 3.1*   No results for input(s): "LIPASE", "AMYLASE" in the last 168 hours. No results for input(s): "AMMONIA" in the last 168 hours. Coagulation Profile: Recent Labs  Lab 10/03/23 1430  INR 1.3*   Cardiac Enzymes: No results for input(s): "CKTOTAL", "CKMB", "CKMBINDEX", "TROPONINI" in the last 168 hours. BNP (last 3 results) Recent Labs    02/04/23 1619 08/26/23 1526  PROBNP 1,402* 1,981*   HbA1C: No results for input(s): "HGBA1C" in the last 72 hours. CBG: Recent Labs  Lab 10/08/23 1623  GLUCAP 99   Lipid Profile: No results for input(s):  "CHOL", "HDL", "LDLCALC", "TRIG", "CHOLHDL", "LDLDIRECT" in the last 72 hours. Thyroid Function Tests: No results for input(s): "TSH", "T4TOTAL", "FREET4", "T3FREE", "THYROIDAB" in the last 72 hours. Anemia Panel: No results for input(s): "VITAMINB12", "FOLATE", "FERRITIN", "TIBC", "IRON", "RETICCTPCT" in the last 72 hours. Sepsis Labs: Recent Labs  Lab 10/03/23 1518 10/04/23 0303 10/07/23 0817 10/07/23 0915 10/07/23 0945  PROCALCITON  --  0.17 0.12  --   --   LATICACIDVEN 1.4  --   --  1.8 1.5    Recent Results (from the past 240 hour(s))  Blood Culture (routine x 2)     Status: None   Collection Time: 10/03/23  2:55 PM   Specimen: BLOOD  Result Value Ref Range Status   Specimen Description BLOOD SITE NOT SPECIFIED  Final   Special Requests   Final    BOTTLES DRAWN AEROBIC AND ANAEROBIC Blood Culture results may not be optimal due to an excessive volume of blood received in culture bottles   Culture   Final    NO GROWTH 5 DAYS Performed at [facility], [address] [place], [place] [address]    Report Status 10/08/2023 FINAL  Final  SARS Coronavirus 2 by RT PCR (hospital order, performed in [facility] hospital lab) *cepheid single result test* Anterior Nasal Swab     Status: None   Collection Time: 10/03/23  3:24 PM   Specimen: Anterior Nasal Swab  Result Value Ref Range Status   SARS Coronavirus 2 by RT PCR NEGATIVE NEGATIVE Final    Comment: Performed at [facility], [address], [place], [place] [address]  Blood Culture (routine x 2)     Status: None   Collection Time: 10/03/23  3:30 PM  Specimen: BLOOD  Result Value Ref Range Status   Specimen Description BLOOD SITE NOT SPECIFIED  Final   Special Requests   Final    BOTTLES DRAWN AEROBIC AND ANAEROBIC Blood Culture results may not be optimal due to an excessive volume of blood received in culture bottles   Culture   Final    NO GROWTH 5 DAYS Performed at [facility], [address]  [place], [place] [address]    Report Status 10/08/2023 FINAL  Final  Urine Culture     Status: Abnormal   Collection Time: 10/03/23  3:30 PM   Specimen: Urine, Catheterized  Result Value Ref Range Status   Specimen Description URINE, CATHETERIZED  Final   Special Requests   Final    NONE Reflexed from [id number] Performed at [facility], [address], [place], [place] [address]    Culture (A)  Final    >=100,000 COLONIES/mL PROTEUS MIRABILIS >=100,000 COLONIES/mL MORGANELLA MORGANII    Report Status 10/06/2023 FINAL  Final   Organism ID, Bacteria PROTEUS MIRABILIS (A)  Final   Organism ID, Bacteria MORGANELLA MORGANII (A)  Final      Susceptibility   Morganella morganii - MIC*    AMPICILLIN >=32 RESISTANT Resistant     CIPROFLOXACIN <=0.25 SENSITIVE Sensitive     GENTAMICIN <=1 SENSITIVE Sensitive     IMIPENEM 2 SENSITIVE Sensitive     NITROFURANTOIN >=512 RESISTANT Resistant     TRIMETH/SULFA <=20 SENSITIVE Sensitive     AMPICILLIN/SULBACTAM >=32 RESISTANT Resistant     PIP/TAZO <=4 SENSITIVE Sensitive ug/mL    * >=100,000 COLONIES/mL MORGANELLA MORGANII   Proteus mirabilis - MIC*    AMPICILLIN <=2 SENSITIVE Sensitive     CEFAZOLIN <=4 SENSITIVE Sensitive     CEFEPIME <=0.12 SENSITIVE Sensitive     CEFTRIAXONE <=0.25 SENSITIVE Sensitive     CIPROFLOXACIN <=0.25 SENSITIVE Sensitive     GENTAMICIN <=1 SENSITIVE Sensitive     IMIPENEM 2 SENSITIVE Sensitive     NITROFURANTOIN 128 RESISTANT Resistant     TRIMETH/SULFA <=20 SENSITIVE Sensitive     AMPICILLIN/SULBACTAM <=2 SENSITIVE Sensitive     PIP/TAZO <=4 SENSITIVE Sensitive ug/mL    * >=100,000 COLONIES/mL PROTEUS MIRABILIS  Resp panel by RT-PCR (RSV, Flu A&B, Covid) Anterior Nasal Swab     Status: None   Collection Time: 10/07/23  8:33 AM   Specimen: Anterior Nasal Swab  Result Value Ref Range Status   SARS Coronavirus 2 by RT PCR NEGATIVE NEGATIVE Final   Influenza A by PCR NEGATIVE NEGATIVE Final   Influenza B by PCR  NEGATIVE NEGATIVE Final    Comment: (NOTE) The Xpert Xpress SARS-CoV-2/FLU/RSV plus assay is intended as an aid in the diagnosis of influenza from Nasopharyngeal swab specimens and should not be used as a sole basis for treatment. Nasal washings and aspirates are unacceptable for Xpert Xpress SARS-CoV-2/FLU/RSV testing.  Fact Sheet for Patients: BloggerCourse.com  Fact Sheet for Healthcare Providers: SeriousBroker.it  This test is not yet approved or cleared by the [place] FDA and has been authorized for detection and/or diagnosis of SARS-CoV-2 by FDA under an Emergency Use Authorization (EUA). This EUA will remain in effect (meaning this test can be used) for the duration of the COVID-19 declaration under Section 564(b)(1) of the Act, 21 U.S.C. section 360bbb-3(b)(1), unless the authorization is terminated or revoked.     Resp Syncytial Virus by PCR NEGATIVE NEGATIVE Final    Comment: (NOTE) Fact Sheet for Patients:  BloggerCourse.com  Fact Sheet for Healthcare Providers: SeriousBroker.it  This test is not yet approved or cleared by the [place] FDA and has been authorized for detection and/or diagnosis of SARS-CoV-2 by FDA under an Emergency Use Authorization (EUA). This EUA will remain in effect (meaning this test can be used) for the duration of the COVID-19 declaration under Section 564(b)(1) of the Act, 21 U.S.C. section 360bbb-3(b)(1), unless the authorization is terminated or revoked.  Performed at [facility], [address], [place], [place] [address]   Blood culture (routine x 2)     Status: None (Preliminary result)   Collection Time: 10/07/23  9:18 AM   Specimen: BLOOD  Result Value Ref Range Status   Specimen Description BLOOD LEFT ANTECUBITAL  Final   Special Requests   Final    BOTTLES DRAWN AEROBIC ONLY Blood Culture adequate volume    Culture   Final    NO GROWTH < 24 HOURS Performed at [facility], [address] [place], [place] [address]    Report Status PENDING  Incomplete  Blood culture (routine x 2)     Status: None (Preliminary result)   Collection Time: 10/07/23  9:23 AM   Specimen: BLOOD  Result Value Ref Range Status   Specimen Description BLOOD RIGHT ANTECUBITAL  Final   Special Requests   Final    BOTTLES DRAWN AEROBIC AND ANAEROBIC Blood Culture adequate volume   Culture   Final    NO GROWTH < 24 HOURS Performed at [facility], [address], [place], [place] [address]    Report Status PENDING  Incomplete     Radiology Studies: No results found.  Scheduled Meds:  apixaban  2.5 mg Oral BID   Chlorhexidine Gluconate Cloth  6 each Topical Daily   cyanocobalamin  1,000 mcg Oral QODAY   DULoxetine  60 mg Oral Daily   furosemide  40 mg Oral Daily   levothyroxine  25 mcg Oral QAC breakfast   nystatin ointment  1 Application Topical BID   polyethylene glycol  17 g Oral Daily   pregabalin  50 mg Oral QHS   sodium chloride flush  3 mL Intravenous Q12H   traMADol  50 mg Oral BID   Continuous Infusions:  azithromycin Stopped (10/08/23 1318)   piperacillin-tazobactam (ZOSYN)  IV 3.375 g (10/09/23 0254)     LOS: 2 days   Hughie Closs, MD [facility]  10/09/2023, 8:46 AM   *Please note that this is a verbal dictation therefore any spelling or grammatical errors are due to the "Dragon Medical One" system interpretation.  Please page via Amion and do not message via secure chat for urgent patient care matters. Secure chat can be used for non urgent patient care matters.  How to contact the [facility] Attending or Consulting provider 7A - 7P or covering provider during after hours 7P -7A, for this patient?  Check the care team in [facility] and look for a) attending/consulting [facility] provider listed and b) the [facility] team listed. Page or secure chat 7A-7P. Log into [email or web address] and use [facility]'s universal password to access. If you do not have the password, please contact the hospital operator. Locate the [facility] provider you are looking for under [facility] and page to a number that you can be directly reached. If you still have difficulty reaching the provider, please page the [facility] (Director on Call) for the Hospitalists listed on amion for assistance.

## 2023-10-09 NOTE — Progress Notes (Signed)
10/10 @ 12 I spoke with Douglas Walker son Douglas Walker) via phone and recv'd a verbal acknowledgement of the IMM Letter. Pt unable to sign for IMM Letter.

## 2023-10-10 DIAGNOSIS — J9601 Acute respiratory failure with hypoxia: Secondary | ICD-10-CM | POA: Diagnosis not present

## 2023-10-10 LAB — CBC WITH DIFFERENTIAL/PLATELET
Abs Immature Granulocytes: 0.08 10*3/uL — ABNORMAL HIGH (ref 0.00–0.07)
Basophils Absolute: 0 10*3/uL (ref 0.0–0.1)
Basophils Relative: 1 %
Eosinophils Absolute: 0.1 10*3/uL (ref 0.0–0.5)
Eosinophils Relative: 1 %
HCT: 26.5 % — ABNORMAL LOW (ref 39.0–52.0)
Hemoglobin: 8.6 g/dL — ABNORMAL LOW (ref 13.0–17.0)
Immature Granulocytes: 1 %
Lymphocytes Relative: 15 %
Lymphs Abs: 1.1 10*3/uL (ref 0.7–4.0)
MCH: 30 pg (ref 26.0–34.0)
MCHC: 32.5 g/dL (ref 30.0–36.0)
MCV: 92.3 fL (ref 80.0–100.0)
Monocytes Absolute: 0.8 10*3/uL (ref 0.1–1.0)
Monocytes Relative: 10 %
Neutro Abs: 5.6 10*3/uL (ref 1.7–7.7)
Neutrophils Relative %: 72 %
Platelets: 112 10*3/uL — ABNORMAL LOW (ref 150–400)
RBC: 2.87 MIL/uL — ABNORMAL LOW (ref 4.22–5.81)
RDW: 16.1 % — ABNORMAL HIGH (ref 11.5–15.5)
WBC: 7.6 10*3/uL (ref 4.0–10.5)
nRBC: 0 % (ref 0.0–0.2)

## 2023-10-10 LAB — BASIC METABOLIC PANEL
Anion gap: 11 (ref 5–15)
BUN: 17 mg/dL (ref 8–23)
CO2: 27 mmol/L (ref 22–32)
Calcium: 9 mg/dL (ref 8.9–10.3)
Chloride: 101 mmol/L (ref 98–111)
Creatinine, Ser: 1.56 mg/dL — ABNORMAL HIGH (ref 0.61–1.24)
GFR, Estimated: 40 mL/min — ABNORMAL LOW (ref >60)
Glucose, Bld: 99 mg/dL (ref 70–99)
Potassium: 3.7 mmol/L (ref 3.5–5.1)
Sodium: 139 mmol/L (ref 135–145)

## 2023-10-10 NOTE — Progress Notes (Signed)
   10/10/23 1941  BiPAP/CPAP/SIPAP  Reason BIPAP/CPAP not in use Non-compliant (pt refused)  BiPAP/CPAP /SiPAP Vitals  Pulse Rate 71  SpO2 93 %  MEWS Score/Color  MEWS Score 0  MEWS Score Color [place]

## 2023-10-10 NOTE — Plan of Care (Signed)
  Problem: Clinical Measurements: Goal: Will remain free from infection Outcome: Progressing Goal: Diagnostic test results will improve Outcome: Progressing Goal: Respiratory complications will improve Outcome: Progressing Goal: Cardiovascular complication will be avoided Outcome: Progressing   

## 2023-10-10 NOTE — Evaluation (Signed)
Occupational Therapy Evaluation Patient Details Name: Douglas Walker MRN: [id number] DOB: 1925/08/18 Today's Date: 10/10/2023   History of Present Illness Pt is a 89 yo malewho  presents to [facility] on 10/07/2023 with SOB. Pt recently discharged on 10/7 after management of PNA. Pt found to be hypoxic, with pulmonary edema and superimposed PNA. PMH includes AMS with tremors, dementia, hypothyroidism, HTN, macular degeneration, UTIs with indwelling catheter, dysphagia - post stroke, CHF, CKD, PPM.   Clinical Impression   At baseline, pt in largely Independent to Mod I with ADLs with assistance from family for laying out clothes and assistance for IADLs, including medication management. Pt now presents with decreased activity tolerance, decreased balance during functional tasks, cardiopulmonary status affecting functional level, and decreased safety and independence with functional tasks. Pt currently demonstrates ability to complete UB ADLs with Set up to Min assist, LB ADLs with Min to Max assist, and functional transfers/mobility with a RW with Contact guard to Min assist. Pt will benefit from acute skilled OT services to address deficits outlined below and increase safety and independence with functional tasks. Post acute discharge, pt will benefit from continued skilled OT services in the home to maximize rehab potential.       If plan is discharge home, recommend the following: A little help with walking and/or transfers;A lot of help with bathing/dressing/bathroom;Assistance with cooking/housework;Direct supervision/assist for medications management;Direct supervision/assist for financial management;Assist for transportation;Help with stairs or ramp for entrance;Supervision due to cognitive status (Set up for self feeding)    Functional Status Assessment  Patient has had a recent decline in their functional status and demonstrates the ability to make significant improvements in function in a  reasonable and predictable amount of time.  Equipment Recommendations  None recommended by OT (Pt already has needed equipment)    Recommendations for Other Services       Precautions / Restrictions Precautions Precautions: Fall Precaution Comments: watch O2 Restrictions Weight Bearing Restrictions: No      Mobility Bed Mobility Overal bed mobility: Needs Assistance Bed Mobility: Rolling, Sidelying to Sit, Sit to Supine Rolling: Supervision Sidelying to sit: Used rails, HOB elevated, Contact guard assist   Sit to supine: Supervision, HOB elevated, Used rails   General bed mobility comments: with mildly increased time and occasional assist for line management    Transfers Overall transfer level: Needs assistance Equipment used: Rolling walker (2 wheels) Transfers: Sit to/from Stand Sit to Stand: Contact guard assist                  Balance Overall balance assessment: Needs assistance Sitting-balance support: No upper extremity supported, Feet supported Sitting balance-Leahy Scale: Good     Standing balance support: Single extremity supported, Bilateral upper extremity supported, During functional activity, Reliant on assistive device for balance Standing balance-Leahy Scale: Poor                             ADL either performed or assessed with clinical judgement   ADL Overall ADL's : Needs assistance/impaired Eating/Feeding: Set up;Sitting Eating/Feeding Details (indicate cue type and reason): with orientation to tray due to vision deficits Grooming: Set up;Sitting Grooming Details (indicate cue type and reason): with orientation to supplies due to vision deficits Upper Body Bathing: Minimal assistance;Sitting   Lower Body Bathing: Minimal assistance;Sit to/from stand;Cueing for compensatory techniques (with increased time)   Upper Body Dressing : Minimal assistance;Sitting   Lower Body Dressing: Minimal assistance;Sit to/from stand  Toilet Transfer: Contact guard assist;Minimal assistance;Ambulation;Rolling walker (2 wheels);BSC/3in1   Toileting- Clothing Manipulation and Hygiene: Maximal assistance;Total assistance;Sit to/from stand Toileting - Clothing Manipulation Details (indicate cue type and reason): currently has indwelling catheter     Functional mobility during ADLs: Contact guard assist;Minimal assistance;Rolling walker (2 wheels) (with orientation to environment secondary to vision deficits) General ADL Comments: Min assist to power up then CGA once in standing for functional mobility/standing     Vision Baseline Vision/History: 2 Legally blind;6 Macular Degeneration (Legally blind in Right; Macular degeneration in Left) Ability to See in Adequate Light: 3 Highly impaired Patient Visual Report: No change from baseline       Perception         Praxis         Pertinent Vitals/Pain Pain Assessment Pain Assessment: Faces Faces Pain Scale: Hurts little more Pain Location: back, buttocks Pain Descriptors / Indicators: Aching, Discomfort, Grimacing, Sore Pain Intervention(s): Limited activity within patient's tolerance, Monitored during session, Repositioned     Extremity/Trunk Assessment Upper Extremity Assessment Upper Extremity Assessment: Right hand dominant;Overall [facility] for tasks assessed   Lower Extremity Assessment Lower Extremity Assessment: Defer to PT evaluation   Cervical / Trunk Assessment Cervical / Trunk Assessment: Kyphotic   Communication Communication Communication: Hearing impairment Cueing Techniques: Verbal cues;Tactile cues;Other (comments) (benefits from orientation to environment secondary to visual deficits)   Cognition Arousal: Alert Behavior During Therapy: WFL for tasks assessed/performed Overall Cognitive Status: History of cognitive impairments - at baseline                                 General Comments: hx of dementia, following commands with  incr time/repetition of instructions; oriented to self and place; pleasant throughout session     General Comments  pt on 3L [place] at rest, sats in low 90s. Pt desats when mobilizing however gripping walker and with unreliable pleth reading. When releasing walker at end of ambulation sats read 88%. Pt recovers to low 90s in ~2 mins but sats fluctuating from low 90s to high 80s    Exercises     Shoulder Instructions      Home Living Family/patient expects to be discharged to:: Private residence Living Arrangements: Children (son and daughter-in-law) Available Help at Discharge: Available 24 hours/day;Family Type of Home: House Home Access: Level entry     Home Layout: One level     Bathroom Shower/Tub: Producer, television/film/video: Handicapped height Bathroom Accessibility: Yes How Accessible: Accessible via walker Home Equipment: Rolling Walker (2 wheels);Grab bars - tub/shower;Shower seat   Additional Comments: lives with son, DIL, grandson.  Per chart review, family states patient will accept any and all help offered to him.      Prior Functioning/Environment Prior Level of Function : Independent/Modified Independent;History of Falls (last six months)             Mobility Comments: Used RW at [employer] I ADLs Comments: son assists with medications, and lays out his clothes, pt otherwise Independent to [place] I with ADLs at baseline        OT Problem List: Decreased activity tolerance;Impaired balance (sitting and/or standing);Cardiopulmonary status limiting activity      OT Treatment/Interventions: Self-care/ADL training;Therapeutic exercise;DME and/or AE instruction;Therapeutic activities;Patient/family education;Balance training    OT Goals(Current goals can be found in the care plan section) Acute Rehab OT Goals Patient Stated Goal: to return home with family support OT Goal  Formulation: With patient Time For Goal Achievement: 10/24/23 Potential to Achieve Goals:  Good ADL Goals Pt Will Perform Upper Body Bathing: with supervision;sitting Pt Will Perform Lower Body Bathing: with supervision;sit to/from stand Pt Will Perform Upper Body Dressing: sitting;with supervision Pt Will Perform Lower Body Dressing: with supervision;sit to/from stand Pt Will Transfer to Toilet: with supervision;regular height toilet Pt Will Perform Toileting - Clothing Manipulation and hygiene: with contact guard assist;sit to/from stand (for clothing management and peri-care)  OT Frequency: Min 1X/week    Co-evaluation PT/OT/SLP Co-Evaluation/Treatment: Yes Reason for Co-Treatment: For patient/therapist safety;To address functional/ADL transfers PT goals addressed during session: Mobility/safety with mobility;Balance;Strengthening/ROM OT goals addressed during session: ADL's and self-care      AM-PAC OT "6 Clicks" Daily Activity     Outcome Measure Help from another person eating meals?: A Little Help from another person taking care of personal grooming?: A Little Help from another person toileting, which includes using toliet, bedpan, or urinal?: A Lot Help from another person bathing (including washing, rinsing, drying)?: A Lot Help from another person to put on and taking off regular upper body clothing?: A Little Help from another person to put on and taking off regular lower body clothing?: A Little 6 Click Score: 16   End of Session Equipment Utilized During Treatment: Gait belt;Rolling walker (2 wheels);Oxygen Nurse Communication: Mobility status  Activity Tolerance: Patient tolerated treatment well Patient left: in bed;with call bell/phone within reach;with bed alarm set  OT Visit Diagnosis: Unsteadiness on feet (R26.81);Other (comment) (Decreased activity tolerance)                Time: 5409-8119 OT Time Calculation (min): 26 min Charges:  OT General Charges $OT Visit: 1 Visit OT Evaluation $OT Eval Low Complexity: 1 Low  Earlyn Sylvan "Kyle" M., OTR/L,  MA Acute Rehab [phone]   Lendon Colonel 10/10/2023, 11:27 AM

## 2023-10-10 NOTE — TOC Progression Note (Signed)
Transition of Care [facility]) - Progression Note    Patient Details  Name: Douglas Walker MRN: [id number] Date of Birth: 01/30/1925  Transition of Care [facility]) CM/SW Contact  Eduard Roux, [place] Phone Number: 10/10/2023, 3:54 PM  Clinical Narrative:     CSW received notice- family is agreeable to SNF- referrals faxed out - sent message to [facility] and [facility]- waiting on response.  TOC will provide bed offers once available. TOC will continue to follow and assist with discharge planning.  Antony Blackbird, MSW, LCSW Clinical Social Worker    Expected Discharge Plan: Home w Home Health Services Barriers to Discharge: Continued Medical Work up  Expected Discharge Plan and Services In-house Referral: Clinical Social Work Discharge Planning Services: CM Consult Post Acute Care Choice: Home Health, Resumption of Svcs/PTA Provider Living arrangements for the past 2 months: Single Family Home                           HH Arranged: PT, RN, Nurse's Aide HH Agency: [facility] Date [facility] Agency Contacted: 10/10/23 Time HH Agency Contacted: 1059 Representative spoke with at [facility] Agency: Kandee Keen   Social Determinants of Health (SDOH) Interventions SDOH Screenings   Food Insecurity: No Food Insecurity (10/07/2023)  Housing: Low Risk  (10/07/2023)  Transportation Needs: No Transportation Needs (10/07/2023)  Utilities: Not At Risk (10/07/2023)  Depression (PHQ2-9): Low Risk  (07/29/2023)  Tobacco Use: Medium Risk (10/08/2023)    Readmission Risk Interventions    10/10/2023   10:25 AM 10/06/2023   12:21 PM 08/04/2023    9:31 AM  Readmission Risk Prevention Plan  Transportation Screening Complete Complete Complete  PCP or Specialist Appt within 3-5 Days   Complete  HRI or Home Care Consult   Complete  Social Work Consult for Recovery Care Planning/Counseling   Complete  Palliative Care Screening   Complete  Medication Review [facility]) Referral to Pharmacy  Complete Complete  PCP or Specialist appointment within 3-5 days of discharge Complete Complete   HRI or Home Care Consult Complete Complete   SW Recovery Care/Counseling Consult Complete    Palliative Care Screening Not Applicable    Skilled Nursing Facility Complete Not Applicable

## 2023-10-10 NOTE — Progress Notes (Signed)
   10/10/23 0000  BiPAP/CPAP/SIPAP  Reason BIPAP/CPAP not in use Other(comment) (pt.refused)

## 2023-10-10 NOTE — TOC Initial Note (Addendum)
Transition of Care [facility]) - Initial/Assessment Note    Patient Details  Name: Douglas Walker MRN: [id number] Date of Birth: 07/20/1925  Transition of Care [facility]) CM/SW Contact:    Gala Lewandowsky, RN Phone Number: 10/10/2023, 10:26 AM  Clinical Narrative:  Risk for readmission assessment completed. Patient presented for acute respiratory failure. PTA patient was from home with son. Case Manager spoke with son and the patient lives with him. Patient has DME: rolling walker, shower chair, cane, wheelchair, and bedside commode. Patient is currently active with [facility] for RN/PT Services- if the plan continues to be home; son would like an aide added. Son feels that the patient will benefit from SNF once he discharges. If the patient qualifies for SNF- son's first choice is [facility]. PT/OT has been ordered and await recommendations. Case Manager will continue to follow for additional transition of care needs.        1053 10-10-23 Case Manager spoke with Physical Therapist and he feels that the patient mobilized well today and should be fine with the recommendations for home health. Case Manager called the son and relayed the information. Plan will be for the patient to return home with [facility] once medically stable. Son has concerns that the patient may have been discharged to quickly last admission and had to be readmitted. Patient will need resumption orders for RN/PT/Aide.      1330 10-10-23 Case Manager had the opportunity to meet with son and daughter-in-law in the room. Family wants the patient to go to rehab post hospital. Case Manager explained that insurance will have to provide authorization before transition. Case Manager did educate the family regarding other alternatives that the family will need to look into such as Assisted Living with Memory Care, Long term SNF-custodial care, vs personal care in the home. Patient is a veteran and family is aware to seek out [place]  resources as well. Case Manager spoke with Aram Beecham CSW and she will fax the patient out to [facility] and [employer]. No further needs identified from Case Manager.   Expected Discharge Plan: Home with Home Health Services Barriers to Discharge: Continued Medical Work up  Expected Discharge Plan and Services In-house Referral: Clinical Social Work Discharge Planning Services: CM Consult Post Acute Care Choice: Skilled Nursing Facility Living arrangements for the past 2 months: Single Family Home    Prior Living Arrangements/Services Living arrangements for the past 2 months: Single Family Home Lives with:: Adult Children Patient language and need for interpreter reviewed:: Yes        Need for Family Participation in Patient Care: Yes (Comment) Care giver support system in place?: Yes (comment) Current home services: DME (rolling walker, shower chair, cane, wheelchair, bedside commode.) Criminal Activity/Legal Involvement Pertinent to Current Situation/Hospitalization: No - Comment as needed  Activities of Daily Living   ADL Screening (condition at time of admission) Independently performs ADLs?: Yes (appropriate for developmental age) Is the patient deaf or have difficulty hearing?: Yes Does the patient have difficulty seeing, even when wearing glasses/contacts?: Yes Does the patient have difficulty concentrating, remembering, or making decisions?: Yes  Permission Sought/Granted Permission sought to share information with : Case Manager, Family Supports    Emotional Assessment Appearance:: Appears stated age  Alcohol / Substance Use: Not Applicable Psych Involvement: No (comment)  Admission diagnosis:  Acute respiratory distress [R06.03] Acute respiratory failure with hypoxia (HCC) [J96.01] Patient Active Problem List   Diagnosis Date Noted   Pancytopenia (HCC) 10/07/2023  Chronic pain 10/07/2023   Complicated urinary tract infection 10/06/2023   Dementia  (HCC) 10/04/2023   Pressure ulcer 10/04/2023   Acute respiratory failure with hypoxia (HCC) 10/03/2023   Sepsis due to pneumonia (HCC) 10/03/2023   Chronic heart failure with preserved ejection fraction (HCC) 10/03/2023   Aneurysm of ascending aorta without rupture (HCC) 02/04/2023   Coronary artery calcification 02/04/2023   Aortic atherosclerosis (HCC) 02/04/2023   Hematuria 01/02/2023   FTT (failure to thrive) in adult 01/02/2023   Nausea and vomiting 10/13/2022   Normocytic anemia 10/13/2022   AAA (abdominal aortic aneurysm) (HCC) 10/13/2022   Physical deconditioning 08/29/2022   Eye pain, right 08/10/2022   Blind painful right eye 08/08/2022   Vitreous hemorrhage of right eye (HCC) 08/05/2022   Choroidal hemorrhage, right 08/05/2022   Secondary glaucoma due to combination mechanisms, right, moderate stage 08/05/2022   History of corneal transplant 05/16/2022   Chronic indwelling Foley catheter 01/31/2022   Benign prostatic hyperplasia with lower urinary tract symptoms 01/31/2022   Medication monitoring encounter 01/31/2022   Multiple drug resistant organism (MDRO) culture positive 01/31/2022   Bacteremia due to Proteus species 10/11/2021   Acute kidney injury superimposed on chronic kidney disease (HCC) 07/06/2021   Pain in both testicles    Right hemiparesis (HCC) 04/23/2021   Cerebrovascular accident (CVA) due to embolism of left middle cerebral artery (HCC) 04/23/2021   Neuropathy 04/23/2021   Abnormality of gait 04/23/2021   Abnormal results of thyroid function studies 04/04/2021   Acquired iron deficiency anemia due to decreased absorption 04/04/2021   Acquired thrombophilia (HCC) 04/04/2021   Cerebral atherosclerosis 04/04/2021   Basal cell carcinoma of nose 04/04/2021   Benign hypertensive heart and renal disease, with heart and renal failure (HCC) 04/04/2021   Benign prostatic hyperplasia 04/04/2021   Obesity 04/04/2021   Closed fracture of one rib 04/04/2021    Constipation by delayed colonic transit 04/04/2021   Gastro-esophageal reflux disease without esophagitis 04/04/2021   Hypothyroidism 04/04/2021   Left lower quadrant pain 04/04/2021   Lumbar radiculopathy 04/04/2021   Reactive depression 04/04/2021   Mass of chest wall 04/04/2021   Mixed hyperlipidemia 04/04/2021   Nocturia 04/04/2021   Other shoulder lesions, right shoulder 04/04/2021   Pain in limb 04/04/2021   Peripheral edema 04/04/2021   Personal history of malignant neoplasm of bladder 04/04/2021   Pruritus of genitalia 04/04/2021   Tremor 04/04/2021   Unilateral primary osteoarthritis, left knee 04/04/2021   Unspecified mononeuropathy of unspecified lower limb 04/04/2021   Urinary incontinence 04/04/2021   Dysphagia, post-stroke    Chronic diastolic congestive heart failure (HCC)    Chronic pain syndrome    Left middle cerebral artery stroke (HCC) 02/01/2021   Pacemaker 08/30/2020   Hematoma, chest wall 07/24/2020   Exudative age-related macular degeneration of right eye with inactive choroidal neovascularization (HCC) 05/10/2020   Exudative age-related macular degeneration of left eye with inactive choroidal neovascularization (HCC) 05/10/2020   Retinal hemorrhage of right eye 05/10/2020   Exposure keratopathy, bilateral 05/10/2020   Bilateral dry eyes 05/10/2020   Advanced nonexudative age-related macular degeneration of both eyes with subfoveal involvement 05/10/2020   Complete heart block (HCC) 12/04/2018   Chronic low back pain 12/15/2017   Hereditary and idiopathic peripheral neuropathy 07/01/2016   Essential tremor 02/28/2016   BPPV (benign paroxysmal positional vertigo) 06/19/2015   Chronic kidney disease, stage III (moderate) (HCC) 06/18/2015   Chronic diastolic CHF (congestive heart failure) (HCC) 05/30/2015   Fall    Hypotension 05/19/2015  Fracture of rib of left side 05/19/2015   Mobitz type II atrioventricular block 09/13/2014   Essential hypertension  09/13/2014   Chronic anticoagulation 09/13/2014   SSS (sick sinus syndrome) (HCC) 09/13/2014   Paroxysmal atrial fibrillation (HCC) 10/28/2013   Thrombocytopenia (HCC) 04/06/2013   PCP:  Georgann Housekeeper, MD Pharmacy:   [facility] [address] [place] - [address] [place] [address] Phone: [phone] Fax: [phone]  [facility] [address] [place] [address] Phone: [phone] Fax: [phone]  Social Determinants of Health (SDOH) Social History: SDOH Screenings   Food Insecurity: No Food Insecurity (10/07/2023)  Housing: Low Risk  (10/07/2023)  Transportation Needs: No Transportation Needs (10/07/2023)  Utilities: Not At Risk (10/07/2023)  Depression (PHQ2-9): Low Risk  (07/29/2023)  Tobacco Use: Medium Risk (10/08/2023)   Readmission Risk Interventions    10/10/2023   10:25 AM 10/06/2023   12:21 PM 08/04/2023    9:31 AM  Readmission Risk Prevention Plan  Transportation Screening Complete Complete Complete  PCP or Specialist Appt within 3-5 Days   Complete  HRI or Home Care Consult   Complete  Social Work Consult for Recovery Care Planning/Counseling   Complete  Palliative Care Screening   Complete  Medication Review Ocean[facility]) Referral to Pharmacy Complete Complete  PCP or Specialist appointment within 3-5 days of discharge Complete Complete   HRI or Home Care Consult Complete Complete   SW Recovery Care/Counseling Consult Complete    Palliative Care Screening Not Applicable    Skilled Nursing Facility Complete Not Applicable

## 2023-10-10 NOTE — Progress Notes (Signed)
PROGRESS NOTE    Douglas Walker  MWN:[id number] DOB: 07/11/1925 DOA: 10/07/2023 PCP: Georgann Housekeeper, MD   Brief Narrative:  89 y.o. male with medical history significant of hypertension, hyperlipidemia, paroxysmal atrial fibrillation on Eliquis, heart failure with preserved EF (50 to 55%, tachybradycardia syndrome s/p PPM, CKD stage III, pancytopenia, hypothyroidism, essential tremor, recurrent UTIs on Macrobid, BPH with chronic indwelling Foley catheter, and dementia admitted on 10/07/2023 with acute hypoxic respiratory failure secondary to sepsis due to pneumonia   Assessment & Plan:   Principal Problem:   Acute respiratory failure with hypoxia (HCC) Active Problems:   Sepsis due to pneumonia (HCC)   Chronic heart failure with preserved ejection fraction (HCC)   Chronic indwelling Foley catheter   Complicated urinary tract infection   Paroxysmal atrial fibrillation (HCC)   Chronic kidney disease, stage III (moderate) (HCC)   Dysphagia, post-stroke   Pancytopenia (HCC)   Hypothyroidism   Chronic pain   Dementia (HCC)  1) severe sepsis secondary to pneumonia and possible UTI--POA: Patient met criteria for severe sepsis based on fever 101.4, tachypnea and leukocytosis as well as acute hypoxic respiratory failure. Urine Legionella antigen negative -Currently on Zosyn and azithromycin pending culture data which is negative so far.  Leukocytosis is improving.  He is afebrile since admission.    2) acute hypoxic respiratory failure secondary to community-acquired pneumonia--initially required BiPAP -Currently weaned down to 3 L of oxygen via nasal cannula, saturating 98%, should wean further.  RN notified.  Incentive spirometry ordered and I discussed with the nurse to provide that to the patient and educate and perhaps remind him as frequently as we can because he has dementia.   3)??CAUTI--history of recurrent UTIs, with chronic indwelling Foley -Patient came here with  Foley -Recent urine culture from 10/03/2023 with Morganella morganii and Proteus---both were sensitive to ciprofloxacin and Zosyn respectively.  Patient currently is on Zosyn and azithromycin.   4)Paroxysmal atrial fibrillation on chronic anticoagulation Tachybradycardia syndrome s/p PPM -Patient is not on AV nodal blocking agents -Continue apixaban for stroke prophylaxis   5) chronic anemia/thrombocytopenia--indices appears close to recent baseline -No bleeding concerns continue to monitor   6) hypothyroidism--continue levothyroxine   7) dementia--- patient very hard of hearing, unsure if he is aware where he is at.  Hard to communicate.  Reorient, supportive care, delirium precautions   8) chronic pain syndrome--- continue Cymbalta, may use tramadol as needed  Acute on chronic congestive heart failure with midrange ejection fraction: Echo done just 5 days ago shows EF of 50 to 55%, chest x-ray shows pulmonary edema but BNP almost at his baseline.  I did not hear any crackles.  Continue home dose of Lasix.  CKD stage IIIb: At baseline, creatinine 1.5-1.7.  Generalized weakness: Patient appears to be significantly weak.  Consulted PT OT this morning.  Assessed by PT OT, they recommended home health.  Discussed with RN, per my assessment and RN's assessment, patient is too weak to go home.  Patient's family/son and daughter-in-law also concerned about him returning home with this weakness.  Discussed with PT, they have now updated their recommendations to SNF.  TOC is working on Museum/gallery curator authorization in the bed.  DVT prophylaxis: apixaban (ELIQUIS) tablet 2.5 mg Start: 10/07/23 2200   Code Status: Limited: Do not attempt resuscitation (DNR) -DNR-LIMITED -Do Not Intubate/DNI   Family Communication:  None present at bedside.  Lengthy discussion with patient's son and daughter-in-law.  Status is: Inpatient Remains inpatient appropriate because: Patient improving, oxygen  being weaned  down.  Will need discharge to SNF.  Will be ready in next 1 to 2 days.   Estimated body mass index is 26.4 kg/m as calculated from the following:   Height as of 10/04/23: 5\' 9"  (1.753 m).   Weight as of this encounter: 81.1 kg.  Pressure Injury 07/30/23 Sacrum Stage 2 -  Partial thickness loss of dermis presenting as a shallow open injury with a red, pink wound bed without slough. (Active)  07/30/23 1156  Location: Sacrum  Location Orientation:   Staging: Stage 2 -  Partial thickness loss of dermis presenting as a shallow open injury with a red, pink wound bed without slough.  Wound Description (Comments):   Present on Admission: Yes  Dressing Type Foam - Lift dressing to assess site every shift 10/06/23 0958     Pressure Injury 07/30/23 Hip Right Unstageable - Full thickness tissue loss in which the base of the injury is covered by slough (yellow, tan, gray, green or brown) and/or eschar (tan, brown or black) in the wound bed. (Active)  07/30/23 1156  Location: Hip  Location Orientation: Right  Staging: Unstageable - Full thickness tissue loss in which the base of the injury is covered by slough (yellow, tan, gray, green or brown) and/or eschar (tan, brown or black) in the wound bed.  Wound Description (Comments):   Present on Admission: Yes  Dressing Type Foam - Lift dressing to assess site every shift 10/06/23 0958     Pressure Injury 10/03/23 Vertebral column Lower Deep Tissue Pressure Injury - Purple or maroon localized area of discolored intact skin or blood-filled blister due to damage of underlying soft tissue from pressure and/or shear. Area of purplish discolora (Active)  10/03/23 2059  Location: Vertebral column  Location Orientation: Lower  Staging: Deep Tissue Pressure Injury - Purple or maroon localized area of discolored intact skin or blood-filled blister due to damage of underlying soft tissue from pressure and/or shear.  Wound Description (Comments): Area of purplish  discoloration above stage 2 on sacrum  Present on Admission: Yes  Dressing Type Foam - Lift dressing to assess site every shift 10/06/23 0958   Nutritional Assessment: Body mass index is 26.4 kg/m.Marland Kitchen Seen by dietician.  I agree with the assessment and plan as outlined below: Nutrition Status:        . Skin Assessment: I have examined the patient's skin and I agree with the wound assessment as performed by the wound care RN as outlined below: Pressure Injury 07/30/23 Sacrum Stage 2 -  Partial thickness loss of dermis presenting as a shallow open injury with a red, pink wound bed without slough. (Active)  07/30/23 1156  Location: Sacrum  Location Orientation:   Staging: Stage 2 -  Partial thickness loss of dermis presenting as a shallow open injury with a red, pink wound bed without slough.  Wound Description (Comments):   Present on Admission: Yes  Dressing Type Foam - Lift dressing to assess site every shift 10/06/23 0958     Pressure Injury 07/30/23 Hip Right Unstageable - Full thickness tissue loss in which the base of the injury is covered by slough (yellow, tan, gray, green or brown) and/or eschar (tan, brown or black) in the wound bed. (Active)  07/30/23 1156  Location: Hip  Location Orientation: Right  Staging: Unstageable - Full thickness tissue loss in which the base of the injury is covered by slough (yellow, tan, gray, green or brown) and/or eschar (tan, brown or black)  in the wound bed.  Wound Description (Comments):   Present on Admission: Yes  Dressing Type Foam - Lift dressing to assess site every shift 10/06/23 0958     Pressure Injury 10/03/23 Vertebral column Lower Deep Tissue Pressure Injury - Purple or maroon localized area of discolored intact skin or blood-filled blister due to damage of underlying soft tissue from pressure and/or shear. Area of purplish discolora (Active)  10/03/23 2059  Location: Vertebral column  Location Orientation: Lower  Staging: Deep  Tissue Pressure Injury - Purple or maroon localized area of discolored intact skin or blood-filled blister due to damage of underlying soft tissue from pressure and/or shear.  Wound Description (Comments): Area of purplish discoloration above stage 2 on sacrum  Present on Admission: Yes  Dressing Type Foam - Lift dressing to assess site every shift 10/06/23 0958    Consultants:  None  Procedures:  None  Antimicrobials:  Anti-infectives (From admission, onward)    Start     Dose/Rate Route Frequency Ordered Stop   10/08/23 1000  cefTRIAXone (ROCEPHIN) 2 g in sodium chloride 0.9 % 100 mL IVPB  Status:  Discontinued        2 g 200 mL/hr over 30 Minutes Intravenous Every 24 hours 10/07/23 1045 10/07/23 1117   10/08/23 1000  azithromycin (ZITHROMAX) 500 mg in sodium chloride 0.9 % 250 mL IVPB        500 mg 250 mL/hr over 60 Minutes Intravenous Every 24 hours 10/07/23 1104 10/12/23 0959   10/07/23 2200  piperacillin-tazobactam (ZOSYN) IVPB 3.375 g        3.375 g 12.5 mL/hr over 240 Minutes Intravenous Every 8 hours 10/07/23 1121     10/07/23 1130  piperacillin-tazobactam (ZOSYN) IVPB 3.375 g        3.375 g 100 mL/hr over 30 Minutes Intravenous Once 10/07/23 1118 10/07/23 1311   10/07/23 1100  cefTRIAXone (ROCEPHIN) 1 g in sodium chloride 0.9 % 100 mL IVPB  Status:  Discontinued       Note to Pharmacy: Should have been 2 g   1 g 200 mL/hr over 30 Minutes Intravenous  Once 10/07/23 1045 10/07/23 1117   10/07/23 0930  cefTRIAXone (ROCEPHIN) 1 g in sodium chloride 0.9 % 100 mL IVPB        1 g 200 mL/hr over 30 Minutes Intravenous  Once 10/07/23 0919 10/07/23 1136   10/07/23 0930  azithromycin (ZITHROMAX) 500 mg in sodium chloride 0.9 % 250 mL IVPB        500 mg 250 mL/hr over 60 Minutes Intravenous  Once 10/07/23 0919 10/07/23 1242         Subjective: Patient seen and examined.  Extremely hard of hearing.  Denies any complaints and appears comfortable.  Objective: Vitals:    10/09/23 1935 10/09/23 2331 10/10/23 0353 10/10/23 0812  BP: (!) 141/70 (!) 145/73 135/70 (!) 152/71  Pulse: 70 68 66 80  Resp: 20 20 18 18   Temp: 98.6 F (37 C) 99.2 F (37.3 C) 98.9 F (37.2 C) 98.5 F (36.9 C)  TempSrc: Oral Oral Oral Oral  SpO2: 93% 93% 96% 96%  Weight:   81.1 kg     Intake/Output Summary (Last 24 hours) at 10/10/2023 1258 Last data filed at 10/10/2023 0700 Gross per 24 hour  Intake --  Output 925 ml  Net -925 ml   Filed Weights   10/09/23 0420 10/10/23 0353  Weight: 81.3 kg 81.1 kg    Examination:  General exam: Appears calm  and comfortable  Respiratory system: Clear to auscultation. Respiratory effort normal. Cardiovascular system: S1 & S2 heard, RRR. No JVD, murmurs, rubs, gallops or clicks. No pedal edema. Gastrointestinal system: Abdomen is nondistended, soft and nontender. No organomegaly or masses felt. Normal bowel sounds heard. Central nervous system: Alert and oriented. No focal neurological deficits. Extremities: Symmetric 5 x 5 power. Skin: No rashes, lesions or ulcers.    Data Reviewed: I have personally reviewed following labs and imaging studies  CBC: Recent Labs  Lab 10/03/23 1430 10/04/23 0303 10/05/23 0242 10/07/23 0817 10/07/23 0823 10/08/23 0431 10/10/23 0854  WBC 10.9* 7.7 5.7 18.1*  --  13.0* 7.6  NEUTROABS 8.4*  --  3.9  --   --   --  5.6  HGB 9.6* 9.5* 9.5* 11.9* 12.9* 9.3* 8.6*  HCT 29.0* 28.8* 28.7* 36.0* 38.0* 28.8* 26.5*  MCV 90.9 92.9 92.9 93.3  --  92.9 92.3  PLT 106* 100* 101* 138*  --  117* 112*   Basic Metabolic Panel: Recent Labs  Lab 10/04/23 0303 10/05/23 0242 10/06/23 0323 10/07/23 0817 10/07/23 0823 10/08/23 0431 10/10/23 0854  NA 137 137 134* 134* 139 138 139  K 3.9 3.7 3.7 3.6 3.8 3.6 3.7  CL 96* 97* 96* 95*  --  102 101  CO2 30 31 30 26   --  26 27  GLUCOSE 100* 91 90 111*  --  100* 99  BUN 21 19 19 23   --  25* 17  CREATININE 1.51* 1.53* 1.48* 1.55*  --  1.73* 1.56*  CALCIUM 8.7* 8.7*  8.7* 9.3  --  8.8* 9.0  MG 2.1  --   --   --   --   --   --    GFR: Estimated Creatinine Clearance: 27.7 mL/min (A) (by C-G formula based on SCr of 1.56 mg/dL (H)). Liver Function Tests: Recent Labs  Lab 10/03/23 1430 10/07/23 0817  AST 30 24  ALT 26 20  ALKPHOS 155* 127*  BILITOT 0.8 1.0  PROT 7.1 8.1  ALBUMIN 2.9* 3.1*   No results for input(s): "LIPASE", "AMYLASE" in the last 168 hours. No results for input(s): "AMMONIA" in the last 168 hours. Coagulation Profile: Recent Labs  Lab 10/03/23 1430  INR 1.3*   Cardiac Enzymes: No results for input(s): "CKTOTAL", "CKMB", "CKMBINDEX", "TROPONINI" in the last 168 hours. BNP (last 3 results) Recent Labs    02/04/23 1619 08/26/23 1526  PROBNP 1,402* 1,981*   HbA1C: No results for input(s): "HGBA1C" in the last 72 hours. CBG: Recent Labs  Lab 10/08/23 1623  GLUCAP 99   Lipid Profile: No results for input(s): "CHOL", "HDL", "LDLCALC", "TRIG", "CHOLHDL", "LDLDIRECT" in the last 72 hours. Thyroid Function Tests: No results for input(s): "TSH", "T4TOTAL", "FREET4", "T3FREE", "THYROIDAB" in the last 72 hours. Anemia Panel: No results for input(s): "VITAMINB12", "FOLATE", "FERRITIN", "TIBC", "IRON", "RETICCTPCT" in the last 72 hours. Sepsis Labs: Recent Labs  Lab 10/03/23 1518 10/04/23 0303 10/07/23 0817 10/07/23 0915 10/07/23 0945  PROCALCITON  --  0.17 0.12  --   --   LATICACIDVEN 1.4  --   --  1.8 1.5    Recent Results (from the past 240 hour(s))  Blood Culture (routine x 2)     Status: None   Collection Time: 10/03/23  2:55 PM   Specimen: BLOOD  Result Value Ref Range Status   Specimen Description BLOOD SITE NOT SPECIFIED  Final   Special Requests   Final    BOTTLES DRAWN AEROBIC AND  ANAEROBIC Blood Culture results may not be optimal due to an excessive volume of blood received in culture bottles   Culture   Final    NO GROWTH 5 DAYS Performed at [facility], [address] [place], [place]  [address]    Report Status 10/08/2023 FINAL  Final  SARS Coronavirus 2 by RT PCR (hospital order, performed in [facility] hospital lab) *cepheid single result test* Anterior Nasal Swab     Status: None   Collection Time: 10/03/23  3:24 PM   Specimen: Anterior Nasal Swab  Result Value Ref Range Status   SARS Coronavirus 2 by RT PCR NEGATIVE NEGATIVE Final    Comment: Performed at [facility], [address], [place], [place] [address]  Blood Culture (routine x 2)     Status: None   Collection Time: 10/03/23  3:30 PM   Specimen: BLOOD  Result Value Ref Range Status   Specimen Description BLOOD SITE NOT SPECIFIED  Final   Special Requests   Final    BOTTLES DRAWN AEROBIC AND ANAEROBIC Blood Culture results may not be optimal due to an excessive volume of blood received in culture bottles   Culture   Final    NO GROWTH 5 DAYS Performed at [facility], [address], [place], [place] [address]    Report Status 10/08/2023 FINAL  Final  Urine Culture     Status: Abnormal   Collection Time: 10/03/23  3:30 PM   Specimen: Urine, Catheterized  Result Value Ref Range Status   Specimen Description URINE, CATHETERIZED  Final   Special Requests   Final    NONE Reflexed from [id number] Performed at [facility], [address] [place], [place] [address]    Culture (A)  Final    >=100,000 COLONIES/mL PROTEUS MIRABILIS >=100,000 COLONIES/mL MORGANELLA MORGANII    Report Status 10/06/2023 FINAL  Final   Organism ID, Bacteria PROTEUS MIRABILIS (A)  Final   Organism ID, Bacteria MORGANELLA MORGANII (A)  Final      Susceptibility   Morganella morganii - MIC*    AMPICILLIN >=32 RESISTANT Resistant     CIPROFLOXACIN <=0.25 SENSITIVE Sensitive     GENTAMICIN <=1 SENSITIVE Sensitive     IMIPENEM 2 SENSITIVE Sensitive     NITROFURANTOIN >=512 RESISTANT Resistant     TRIMETH/SULFA <=20 SENSITIVE Sensitive     AMPICILLIN/SULBACTAM >=32 RESISTANT Resistant     PIP/TAZO <=4 SENSITIVE  Sensitive ug/mL    * >=100,000 COLONIES/mL MORGANELLA MORGANII   Proteus mirabilis - MIC*    AMPICILLIN <=2 SENSITIVE Sensitive     CEFAZOLIN <=4 SENSITIVE Sensitive     CEFEPIME <=0.12 SENSITIVE Sensitive     CEFTRIAXONE <=0.25 SENSITIVE Sensitive     CIPROFLOXACIN <=0.25 SENSITIVE Sensitive     GENTAMICIN <=1 SENSITIVE Sensitive     IMIPENEM 2 SENSITIVE Sensitive     NITROFURANTOIN 128 RESISTANT Resistant     TRIMETH/SULFA <=20 SENSITIVE Sensitive     AMPICILLIN/SULBACTAM <=2 SENSITIVE Sensitive     PIP/TAZO <=4 SENSITIVE Sensitive ug/mL    * >=100,000 COLONIES/mL PROTEUS MIRABILIS  Resp panel by RT-PCR (RSV, Flu A&B, Covid) Anterior Nasal Swab     Status: None   Collection Time: 10/07/23  8:33 AM   Specimen: Anterior Nasal Swab  Result Value Ref Range Status   SARS Coronavirus 2 by RT PCR NEGATIVE NEGATIVE Final   Influenza A by PCR NEGATIVE NEGATIVE Final   Influenza B by PCR  NEGATIVE NEGATIVE Final    Comment: (NOTE) The Xpert Xpress SARS-CoV-2/FLU/RSV plus assay is intended as an aid in the diagnosis of influenza from Nasopharyngeal swab specimens and should not be used as a sole basis for treatment. Nasal washings and aspirates are unacceptable for Xpert Xpress SARS-CoV-2/FLU/RSV testing.  Fact Sheet for Patients: BloggerCourse.com  Fact Sheet for Healthcare Providers: SeriousBroker.it  This test is not yet approved or cleared by the [place] FDA and has been authorized for detection and/or diagnosis of SARS-CoV-2 by FDA under an Emergency Use Authorization (EUA). This EUA will remain in effect (meaning this test can be used) for the duration of the COVID-19 declaration under Section 564(b)(1) of the Act, 21 U.S.C. section 360bbb-3(b)(1), unless the authorization is terminated or revoked.     Resp Syncytial Virus by PCR NEGATIVE NEGATIVE Final    Comment: (NOTE) Fact Sheet for  Patients: BloggerCourse.com  Fact Sheet for Healthcare Providers: SeriousBroker.it  This test is not yet approved or cleared by the [place] FDA and has been authorized for detection and/or diagnosis of SARS-CoV-2 by FDA under an Emergency Use Authorization (EUA). This EUA will remain in effect (meaning this test can be used) for the duration of the COVID-19 declaration under Section 564(b)(1) of the Act, 21 U.S.C. section 360bbb-3(b)(1), unless the authorization is terminated or revoked.  Performed at [facility], [address] [place], [place] [address]   Blood culture (routine x 2)     Status: None (Preliminary result)   Collection Time: 10/07/23  9:18 AM   Specimen: BLOOD  Result Value Ref Range Status   Specimen Description BLOOD LEFT ANTECUBITAL  Final   Special Requests   Final    BOTTLES DRAWN AEROBIC ONLY Blood Culture adequate volume   Culture   Final    NO GROWTH 2 DAYS Performed at [facility], [address], [place], [place] [address]    Report Status PENDING  Incomplete  Blood culture (routine x 2)     Status: None (Preliminary result)   Collection Time: 10/07/23  9:23 AM   Specimen: BLOOD  Result Value Ref Range Status   Specimen Description BLOOD RIGHT ANTECUBITAL  Final   Special Requests   Final    BOTTLES DRAWN AEROBIC AND ANAEROBIC Blood Culture adequate volume   Culture   Final    NO GROWTH 2 DAYS Performed at [facility], [address], [place], [place] [address]    Report Status PENDING  Incomplete  Urine Culture     Status: Abnormal   Collection Time: 10/07/23  9:28 AM   Specimen: Urine, Catheterized  Result Value Ref Range Status   Specimen Description URINE, CATHETERIZED  Final   Special Requests NONE  Final   Culture (A)  Final    <10,000 COLONIES/mL INSIGNIFICANT GROWTH Performed at [facility], [address], [place], [place] [address]    Report Status  10/09/2023 FINAL  Final     Radiology Studies: No results found.  Scheduled Meds:  apixaban  2.5 mg Oral BID   Chlorhexidine Gluconate Cloth  6 each Topical Daily   cyanocobalamin  1,000 mcg Oral QODAY   DULoxetine  60 mg Oral Daily   furosemide  40 mg Oral Daily   levothyroxine  25 mcg Oral QAC breakfast   nystatin ointment  1 Application Topical BID   polyethylene glycol  17 g Oral Daily   pregabalin  50 mg Oral QHS  sodium chloride flush  3 mL Intravenous Q12H   traMADol  50 mg Oral BID   Continuous Infusions:  azithromycin 500 mg (10/10/23 1029)   piperacillin-tazobactam (ZOSYN)  IV 3.375 g (10/10/23 0548)     LOS: 3 days   Hughie Closs, MD [facility]  10/10/2023, 12:58 PM   *Please note that this is a verbal dictation therefore any spelling or grammatical errors are due to the "Dragon Medical One" system interpretation.  Please page via Amion and do not message via secure chat for urgent patient care matters. Secure chat can be used for non urgent patient care matters.  How to contact the [facility] Attending or Consulting provider 7A - 7P or covering provider during after hours 7P -7A, for this patient?  Check the care team in [facility] and look for a) attending/consulting TR[facility] provider listed and b) the [facility] team listed. Page or secure chat 7A-7P. Log into [email or web address] and use 's universal password to access. If you do not have the password, please contact the hospital operator. Locate the [facility] provider you are looking for under [facility] and page to a number that you can be directly reached. If you still have difficulty reaching the provider, please page the [facility] (Director on Call) for the Hospitalists listed on amion for assistance.

## 2023-10-10 NOTE — [place] FL2 (Signed)
[place] MEDICAID FL2 LEVEL OF CARE FORM     IDENTIFICATION  Patient Name: Douglas Walker Birthdate: [birth year] Sex: male Admission Date (Current Location): 10/07/2023  [facility] and [place] Number:  Producer, television/film/video and Address:  [facility], [address] [place], [place] [address]      Provider Number: [id number]  Attending Physician Name and Address:  Hughie Closs, MD  Relative Name and Phone Number:       Current Level of Care: Hospital Recommended Level of Care: Skilled Nursing Facility Prior Approval Number:    Date Approved/Denied:   PASRR Number: [id number]  Discharge Plan: SNF    Current Diagnoses: Patient Active Problem List   Diagnosis Date Noted   Pancytopenia (HCC) 10/07/2023   Chronic pain 10/07/2023   Complicated urinary tract infection 10/06/2023   Dementia (HCC) 10/04/2023   Pressure ulcer 10/04/2023   Acute respiratory failure with hypoxia (HCC) 10/03/2023   Sepsis due to pneumonia (HCC) 10/03/2023   Chronic heart failure with preserved ejection fraction (HCC) 10/03/2023   Aneurysm of ascending aorta without rupture (HCC) 02/04/2023   Coronary artery calcification 02/04/2023   Aortic atherosclerosis (HCC) 02/04/2023   Hematuria 01/02/2023   FTT (failure to thrive) in adult 01/02/2023   Nausea and vomiting 10/13/2022   Normocytic anemia 10/13/2022   AAA (abdominal aortic aneurysm) (HCC) 10/13/2022   Physical deconditioning 08/29/2022   Eye pain, right 08/10/2022   Blind painful right eye 08/08/2022   Vitreous hemorrhage of right eye (HCC) 08/05/2022   Choroidal hemorrhage, right 08/05/2022   Secondary glaucoma due to combination mechanisms, right, moderate stage 08/05/2022   History of corneal transplant 05/16/2022   Chronic indwelling Foley catheter 01/31/2022   Benign prostatic hyperplasia with lower urinary tract symptoms 01/31/2022   Medication monitoring encounter 01/31/2022   Multiple drug resistant  organism (MDRO) culture positive 01/31/2022   Bacteremia due to Proteus species 10/11/2021   Acute kidney injury superimposed on chronic kidney disease (HCC) 07/06/2021   Pain in both testicles    Right hemiparesis (HCC) 04/23/2021   Cerebrovascular accident (CVA) due to embolism of left middle cerebral artery (HCC) 04/23/2021   Neuropathy 04/23/2021   Abnormality of gait 04/23/2021   Abnormal results of thyroid function studies 04/04/2021   Acquired iron deficiency anemia due to decreased absorption 04/04/2021   Acquired thrombophilia (HCC) 04/04/2021   Cerebral atherosclerosis 04/04/2021   Basal cell carcinoma of nose 04/04/2021   Benign hypertensive heart and renal disease, with heart and renal failure (HCC) 04/04/2021   Benign prostatic hyperplasia 04/04/2021   Obesity 04/04/2021   Closed fracture of one rib 04/04/2021   Constipation by delayed colonic transit 04/04/2021   Gastro-esophageal reflux disease without esophagitis 04/04/2021   Hypothyroidism 04/04/2021   Left lower quadrant pain 04/04/2021   Lumbar radiculopathy 04/04/2021   Reactive depression 04/04/2021   Mass of chest wall 04/04/2021   Mixed hyperlipidemia 04/04/2021   Nocturia 04/04/2021   Other shoulder lesions, right shoulder 04/04/2021   Pain in limb 04/04/2021   Peripheral edema 04/04/2021   Personal history of malignant neoplasm of bladder 04/04/2021   Pruritus of genitalia 04/04/2021   Tremor 04/04/2021   Unilateral primary osteoarthritis, left knee 04/04/2021   Unspecified mononeuropathy of unspecified lower limb 04/04/2021   Urinary incontinence 04/04/2021   Dysphagia, post-stroke    Chronic diastolic congestive heart failure (HCC)    Chronic pain syndrome    Left middle cerebral artery stroke (HCC) 02/01/2021   Pacemaker 08/30/2020  Hematoma, chest wall 07/24/2020   Exudative age-related macular degeneration of right eye with inactive choroidal neovascularization (HCC) 05/10/2020   Exudative  age-related macular degeneration of left eye with inactive choroidal neovascularization (HCC) 05/10/2020   Retinal hemorrhage of right eye 05/10/2020   Exposure keratopathy, bilateral 05/10/2020   Bilateral dry eyes 05/10/2020   Advanced nonexudative age-related macular degeneration of both eyes with subfoveal involvement 05/10/2020   Complete heart block (HCC) 12/04/2018   Chronic low back pain 12/15/2017   Hereditary and idiopathic peripheral neuropathy 07/01/2016   Essential tremor 02/28/2016   BPPV (benign paroxysmal positional vertigo) 06/19/2015   Chronic kidney disease, stage III (moderate) (HCC) 06/18/2015   Chronic diastolic CHF (congestive heart failure) (HCC) 05/30/2015   Fall    Hypotension 05/19/2015   Fracture of rib of left side 05/19/2015   Mobitz type II atrioventricular block 09/13/2014   Essential hypertension 09/13/2014   Chronic anticoagulation 09/13/2014   SSS (sick sinus syndrome) (HCC) 09/13/2014   Paroxysmal atrial fibrillation (HCC) 10/28/2013   Thrombocytopenia (HCC) 04/06/2013    Orientation RESPIRATION BLADDER Height & Weight     Self, Time, Situation  Normal Continent Weight: 178 lb 12.7 oz (81.1 kg) Height:     BEHAVIORAL SYMPTOMS/MOOD NEUROLOGICAL BOWEL NUTRITION STATUS      Incontinent Diet (please see discharge summary)  AMBULATORY STATUS COMMUNICATION OF NEEDS Skin   Limited Assist Verbally Other (Comment) (pressure injury vetebral column, right hip unstageable , press in sacrum stage 2)                       Personal Care Assistance Level of Assistance  Bathing, Feeding, Dressing Bathing Assistance: Limited assistance Feeding assistance: Independent Dressing Assistance: Limited assistance     Functional Limitations Info  Sight, Hearing, Speech Sight Info: Impaired Hearing Info: Impaired Speech Info: Adequate    SPECIAL CARE FACTORS FREQUENCY  PT (By licensed PT), OT (By licensed OT)     PT Frequency: 5x per week OT Frequency:  5x per week            Contractures Contractures Info: Not present    Additional Factors Info  Code Status, Allergies Code Status Info: DNR Allergies Info: Sulfa Antibiotics,Mysoline,Zocor           Current Medications (10/10/2023):  This is the current hospital active medication list Current Facility-Administered Medications  Medication Dose Route Frequency Provider Last Rate Last Admin   acetaminophen (TYLENOL) tablet 650 mg  650 mg Oral Q6H PRN Clydie Braun, MD       Or   acetaminophen (TYLENOL) suppository 650 mg  650 mg Rectal Q6H PRN Madelyn Flavors A, MD       albuterol (PROVENTIL) (2.5 MG/3ML) 0.083% nebulizer solution 2.5 mg  2.5 mg Nebulization Q2H PRN Madelyn Flavors A, MD       apixaban (ELIQUIS) tablet 2.5 mg  2.5 mg Oral BID Smith, Rondell A, MD   2.5 mg at 10/10/23 1024   azithromycin (ZITHROMAX) 500 mg in sodium chloride 0.9 % 250 mL IVPB  500 mg Intravenous Q24H Smith, Rondell A, MD 250 mL/hr at 10/10/23 1029 500 mg at 10/10/23 1029   Chlorhexidine Gluconate Cloth 2 % PADS 6 each  6 each Topical Daily Emokpae, Courage, MD   6 each at 10/09/23 1034   cyanocobalamin (VITAMIN B12) tablet 1,000 mcg  1,000 mcg Oral Mallie Snooks, Daleen Bo, MD   1,000 mcg at 10/09/23 1025   DULoxetine (CYMBALTA) DR capsule 60 mg  60 mg Oral Daily Katrinka Blazing, Rondell A, MD   60 mg at 10/10/23 1024   furosemide (LASIX) tablet 40 mg  40 mg Oral Daily Hughie Closs, MD   40 mg at 10/10/23 1024   levothyroxine (SYNTHROID) tablet 25 mcg  25 mcg Oral QAC breakfast Madelyn Flavors A, MD   25 mcg at 10/10/23 0547   nystatin ointment (MYCOSTATIN) 1 Application  1 Application Topical BID Madelyn Flavors A, MD   1 Application at 10/10/23 1029   piperacillin-tazobactam (ZOSYN) IVPB 3.375 g  3.375 g Intravenous Q8H Smith, Rondell A, MD 12.5 mL/hr at 10/10/23 1505 3.375 g at 10/10/23 1505   polyethylene glycol (MIRALAX / GLYCOLAX) packet 17 g  17 g Oral Daily Pahwani, Daleen Bo, MD   17 g at 10/10/23 1024    pregabalin (LYRICA) capsule 50 mg  50 mg Oral QHS Pahwani, Daleen Bo, MD   50 mg at 10/09/23 2203   sodium chloride flush (NS) 0.9 % injection 3 mL  3 mL Intravenous Q12H Smith, Rondell A, MD   3 mL at 10/10/23 1024   traMADol (ULTRAM) tablet 50 mg  50 mg Oral BID Madelyn Flavors A, MD   50 mg at 10/10/23 1024     Discharge Medications: Please see discharge summary for a list of discharge medications.  Relevant Imaging Results:  Relevant Lab Results:   Additional Information SSN [id number]  Eduard Roux, LCSW

## 2023-10-10 NOTE — Evaluation (Addendum)
Physical Therapy Evaluation Patient Details Name: Douglas Walker MRN: [id number] DOB: February 17, 1925 Today's Date: 10/10/2023  History of Present Illness  89 yo male presents to [facility] on 10/07/2023 with SOB. Pt recently discharged on 10/7 after management of PNA. Pt found to be hypoxic, with pulmonary edema and superimposed PNA. PMH includes AMS with tremors, dementia, hypothyroidism, HTN, macular degeneration, UTIs with indwelling catheter, dysphagia - post stroke, CHF, CKD, PPM.  Clinical Impression  Pt presents to PT with deficits in functional mobility, gait, balance, endurance, strength, power, cardiopulmonary function. Pt is able to ambulate for short household distances with support of RW. Pt reports fair tolerance for mobility but is requiring 3L of supplemental oxygen to maintain sats in high 80s when mobilizing, typically no on oxygen at home. PT encourages frequent mobilization with staff assistance in an effort to improve activity tolerance. Per discussion with MD and social worker the pt's caregivers feel he would benefit from a short term rehab stay, as he recently returned home and did not recover well. Family report the pt generally will tolerate therapy well but is completely exhausted after and unable to care for himself the rest of the day due to fatigue. Pt recommends short term inpatient PT services at this time. PT will follow and hope to progress in the acute setting.      If plan is discharge home, recommend the following: A little help with walking and/or transfers;A little help with bathing/dressing/bathroom   Can travel by private vehicle        Equipment Recommendations None recommended by PT  Recommendations for Other Services       Functional Status Assessment Patient has had a recent decline in their functional status and demonstrates the ability to make significant improvements in function in a reasonable and predictable amount of time.     Precautions /  Restrictions Precautions Precautions: Fall Restrictions Weight Bearing Restrictions: No      Mobility  Bed Mobility Overal bed mobility: Needs Assistance Bed Mobility: Rolling, Sidelying to Sit, Sit to Supine Rolling: Supervision Sidelying to sit: Used rails, HOB elevated, Contact guard assist   Sit to supine: Supervision        Transfers Overall transfer level: Needs assistance Equipment used: Rolling walker (2 wheels) Transfers: Sit to/from Stand Sit to Stand: Contact guard assist                Ambulation/Gait Ambulation/Gait assistance: Contact guard assist Gait Distance (Feet): 100 Feet Assistive device: Rolling walker (2 wheels) Gait Pattern/deviations: Step-through pattern Gait velocity: reduced Gait velocity interpretation: <1.8 ft/sec, indicate of risk for recurrent falls   General Gait Details: slowed step-through gait  Stairs            Wheelchair Mobility     Tilt Bed    Modified Rankin (Stroke Patients Only)       Balance Overall balance assessment: Needs assistance Sitting-balance support: No upper extremity supported, Feet supported Sitting balance-Leahy Scale: Good     Standing balance support: Single extremity supported, Reliant on assistive device for balance Standing balance-Leahy Scale: Poor                               Pertinent Vitals/Pain Pain Assessment Pain Assessment: No/denies pain    Home Living Family/patient expects to be discharged to:: Private residence Living Arrangements: Children Available Help at Discharge: Available 24 hours/day Type of Home: House Home Access: Level entry  Home Layout: One level Home Equipment: Rolling Walker (2 wheels);Grab bars - tub/shower;Shower seat      Prior Function Prior Level of Function : Independent/Modified Independent;History of Falls (last six months)             Mobility Comments: Used RW at [employer] I ADLs Comments: son assists with  medications, and lays out his clothes     Extremity/Trunk Assessment   Upper Extremity Assessment Upper Extremity Assessment: Defer to OT evaluation    Lower Extremity Assessment Lower Extremity Assessment: Generalized weakness    Cervical / Trunk Assessment Cervical / Trunk Assessment: Kyphotic  Communication   Communication Communication: Hearing impairment Cueing Techniques: Verbal cues  Cognition Arousal: Alert Behavior During Therapy: WFL for tasks assessed/performed Overall Cognitive Status: History of cognitive impairments - at baseline                                 General Comments: dementia noted at baseline, appropriate for this session, following commands well        General Comments General comments (skin integrity, edema, etc.): pt on 3L [place] at rest, sats in low 90s. Pt desats when mobilizing however gripping walker and with unreliable pleth reading. When releasing walker at end of ambulation sats read 88%. Pt recovers to low 90s in ~2 mins but sats fluctuating from low 90s to high 80s    Exercises     Assessment/Plan    PT Assessment Patient needs continued PT services  PT Problem List Decreased strength;Decreased activity tolerance;Decreased balance;Decreased mobility;Cardiopulmonary status limiting activity;Decreased knowledge of use of DME       PT Treatment Interventions DME instruction;Gait training;Functional mobility training;Therapeutic activities;Therapeutic exercise;Balance training;Neuromuscular re-education;Cognitive remediation;Patient/family education    PT Goals (Current goals can be found in the Care Plan section)  Acute Rehab PT Goals Patient Stated Goal: to return home PT Goal Formulation: With patient Time For Goal Achievement: 10/24/23 Potential to Achieve Goals: Good    Frequency Min 1X/week     Co-evaluation PT/OT/SLP Co-Evaluation/Treatment: Yes Reason for Co-Treatment: For patient/therapist safety;To address  functional/ADL transfers PT goals addressed during session: Mobility/safety with mobility;Balance;Strengthening/ROM         AM-PAC PT "6 Clicks" Mobility  Outcome Measure Help needed turning from your back to your side while in a flat bed without using bedrails?: A Little Help needed moving from lying on your back to sitting on the side of a flat bed without using bedrails?: A Little Help needed moving to and from a bed to a chair (including a wheelchair)?: A Little Help needed standing up from a chair using your arms (e.g., wheelchair or bedside chair)?: A Little Help needed to walk in hospital room?: A Little Help needed climbing 3-5 steps with a railing? : A Little 6 Click Score: 18    End of Session Equipment Utilized During Treatment: Oxygen Activity Tolerance: Patient limited by fatigue Patient left: in bed;with call bell/phone within reach;with bed alarm set Nurse Communication: Mobility status PT Visit Diagnosis: Other abnormalities of gait and mobility (R26.89);Muscle weakness (generalized) (M62.81)    Time: 8295-6213 PT Time Calculation (min) (ACUTE ONLY): 26 min   Charges:   PT Evaluation $PT Eval Low Complexity: 1 Low   PT General Charges $$ ACUTE PT VISIT: 1 Visit        Arlyss Gandy, PT, DPT Acute Rehabilitation Office [phone]   Arlyss Gandy 10/10/2023, 10:18 AM

## 2023-10-11 DIAGNOSIS — J9601 Acute respiratory failure with hypoxia: Secondary | ICD-10-CM | POA: Diagnosis not present

## 2023-10-11 NOTE — Progress Notes (Addendum)
Mobility Specialist Progress Note:    10/11/23 1426  Mobility  Activity Ambulated with assistance in hallway  Level of Assistance Contact guard assist, steadying assist  Assistive Device Front wheel walker  Distance Ambulated (ft) 100 ft  Activity Response Tolerated well  Mobility Referral Yes  $Mobility charge 1 Mobility  Mobility Specialist Start Time (ACUTE ONLY) 1335  Mobility Specialist Stop Time (ACUTE ONLY)  1350  Mobility Specialist Time Calculation (min) (ACUTE ONLY) 15 min   Pt received in bed agreeable to mobility. Pt needed no physical assistance during session just CG for support. No c/o throughout session. Ambulated on 2L/min, VSS, SPO2 90%. [facility] Call bell and personal belongings in reach. NT notifed. All needs met. Left on 1L/min, SPO2 92%.    Thompson Grayer Mobility Specialist  Please contact vis Secure Chat or  Rehab Office [phone]

## 2023-10-11 NOTE — Plan of Care (Signed)
  Problem: Respiratory: Goal: Ability to maintain adequate ventilation will improve Outcome: Progressing Goal: Ability to maintain a clear airway will improve Outcome: Progressing   Problem: Education: Goal: Knowledge of General Education information will improve Description: Including pain rating scale, medication(s)/side effects and non-pharmacologic comfort measures Outcome: Progressing   Problem: Health Behavior/Discharge Planning: Goal: Ability to manage health-related needs will improve Outcome: Progressing   Problem: Pain Managment: Goal: General experience of comfort will improve Outcome: Progressing   Problem: Activity: Goal: Risk for activity intolerance will decrease Outcome: Not Progressing

## 2023-10-11 NOTE — Plan of Care (Signed)
?  Problem: Respiratory: ?Goal: Ability to maintain adequate ventilation will improve ?Outcome: Progressing ?Goal: Ability to maintain a clear airway will improve ?Outcome: Progressing ?  ?

## 2023-10-11 NOTE — Progress Notes (Signed)
PROGRESS NOTE    Douglas Walker  KGM:[id number] DOB: 1925-02-09 DOA: 10/07/2023 PCP: Georgann Housekeeper, MD   Brief Narrative:  89 y.o. male with medical history significant of hypertension, hyperlipidemia, paroxysmal atrial fibrillation on Eliquis, heart failure with preserved EF (50 to 55%, tachybradycardia syndrome s/p PPM, CKD stage III, pancytopenia, hypothyroidism, essential tremor, recurrent UTIs on Macrobid, BPH with chronic indwelling Foley catheter, and dementia admitted on 10/07/2023 with acute hypoxic respiratory failure secondary to sepsis due to pneumonia   Assessment & Plan:   Principal Problem:   Acute respiratory failure with hypoxia (HCC) Active Problems:   Sepsis due to pneumonia (HCC)   Chronic heart failure with preserved ejection fraction (HCC)   Chronic indwelling Foley catheter   Complicated urinary tract infection   Paroxysmal atrial fibrillation (HCC)   Chronic kidney disease, stage III (moderate) (HCC)   Dysphagia, post-stroke   Pancytopenia (HCC)   Hypothyroidism   Chronic pain   Dementia (HCC)  1) severe sepsis secondary to pneumonia and possible UTI--POA: Patient met criteria for severe sepsis based on fever 101.4, tachypnea and leukocytosis as well as acute hypoxic respiratory failure. Urine Legionella antigen negative -Received Zosyn and azithromycin and completed course, all cultures returned negative.  Leukocytosis improving and he is afebrile since admission.    2) acute hypoxic respiratory failure secondary to community-acquired pneumonia--initially required BiPAP.  Currently on 1.5 L oxygen and saturating 94%, continue to wean, RN advised.  He needs reminder for using incentive spirometry.   3)??CAUTI--history of recurrent UTIs, with chronic indwelling Foley -Patient came here with Foley -Recent urine culture from 10/03/2023 with Morganella morganii and Proteus---both were sensitive to ciprofloxacin and Zosyn respectively.  Patient received Zosyn  and azithromycin during this hospitalization.   4)Paroxysmal atrial fibrillation on chronic anticoagulation Tachybradycardia syndrome s/p PPM -Patient is not on AV nodal blocking agents -Continue apixaban for stroke prophylaxis   5) chronic anemia/thrombocytopenia--indices appears close to recent baseline -No bleeding concerns continue to monitor   6) hypothyroidism--continue levothyroxine   7) dementia--- patient very hard of hearing, unsure if he is aware where he is at.  Hard to communicate.  Reorient, supportive care, delirium precautions   8) chronic pain syndrome--- continue Cymbalta, may use tramadol as needed  Acute on chronic congestive heart failure with midrange ejection fraction: Echo done just 5 days ago shows EF of 50 to 55%, chest x-ray shows pulmonary edema but BNP almost at his baseline.  I did not hear any crackles.  Continue home dose of Lasix.  CKD stage IIIb: At baseline, creatinine 1.5-1.7.  Generalized weakness: Evaluated by PT OT, SNF recommended, TOC working on placement.  DVT prophylaxis: apixaban (ELIQUIS) tablet 2.5 mg Start: 10/07/23 2200   Code Status: Limited: Do not attempt resuscitation (DNR) -DNR-LIMITED -Do Not Intubate/DNI   Family Communication:  None present at bedside.  Lengthy discussion with patient's son and daughter-in-law.  Status is: Inpatient Remains inpatient appropriate because: Patient is now medically stable, pending placement to SNF.   Estimated body mass index is 26.37 kg/m as calculated from the following:   Height as of 10/04/23: 5\' 9"  (1.753 m).   Weight as of this encounter: 81 kg.  Pressure Injury 07/30/23 Sacrum Stage 2 -  Partial thickness loss of dermis presenting as a shallow open injury with a red, pink wound bed without slough. (Active)  07/30/23 1156  Location: Sacrum  Location Orientation:   Staging: Stage 2 -  Partial thickness loss of dermis presenting as a shallow open injury  with a red, pink wound bed without  slough.  Wound Description (Comments):   Present on Admission: Yes  Dressing Type Foam - Lift dressing to assess site every shift 10/06/23 0958     Pressure Injury 07/30/23 Hip Right Unstageable - Full thickness tissue loss in which the base of the injury is covered by slough (yellow, tan, gray, green or brown) and/or eschar (tan, brown or black) in the wound bed. (Active)  07/30/23 1156  Location: Hip  Location Orientation: Right  Staging: Unstageable - Full thickness tissue loss in which the base of the injury is covered by slough (yellow, tan, gray, green or brown) and/or eschar (tan, brown or black) in the wound bed.  Wound Description (Comments):   Present on Admission: Yes  Dressing Type Foam - Lift dressing to assess site every shift 10/06/23 0958     Pressure Injury 10/03/23 Vertebral column Lower Deep Tissue Pressure Injury - Purple or maroon localized area of discolored intact skin or blood-filled blister due to damage of underlying soft tissue from pressure and/or shear. Area of purplish discolora (Active)  10/03/23 2059  Location: Vertebral column  Location Orientation: Lower  Staging: Deep Tissue Pressure Injury - Purple or maroon localized area of discolored intact skin or blood-filled blister due to damage of underlying soft tissue from pressure and/or shear.  Wound Description (Comments): Area of purplish discoloration above stage 2 on sacrum  Present on Admission: Yes  Dressing Type Foam - Lift dressing to assess site every shift 10/06/23 0958   Nutritional Assessment: Body mass index is 26.37 kg/m.Marland Kitchen Seen by dietician.  I agree with the assessment and plan as outlined below: Nutrition Status:        . Skin Assessment: I have examined the patient's skin and I agree with the wound assessment as performed by the wound care RN as outlined below: Pressure Injury 07/30/23 Sacrum Stage 2 -  Partial thickness loss of dermis presenting as a shallow open injury with a red,  pink wound bed without slough. (Active)  07/30/23 1156  Location: Sacrum  Location Orientation:   Staging: Stage 2 -  Partial thickness loss of dermis presenting as a shallow open injury with a red, pink wound bed without slough.  Wound Description (Comments):   Present on Admission: Yes  Dressing Type Foam - Lift dressing to assess site every shift 10/06/23 0958     Pressure Injury 07/30/23 Hip Right Unstageable - Full thickness tissue loss in which the base of the injury is covered by slough (yellow, tan, gray, green or brown) and/or eschar (tan, brown or black) in the wound bed. (Active)  07/30/23 1156  Location: Hip  Location Orientation: Right  Staging: Unstageable - Full thickness tissue loss in which the base of the injury is covered by slough (yellow, tan, gray, green or brown) and/or eschar (tan, brown or black) in the wound bed.  Wound Description (Comments):   Present on Admission: Yes  Dressing Type Foam - Lift dressing to assess site every shift 10/06/23 0958     Pressure Injury 10/03/23 Vertebral column Lower Deep Tissue Pressure Injury - Purple or maroon localized area of discolored intact skin or blood-filled blister due to damage of underlying soft tissue from pressure and/or shear. Area of purplish discolora (Active)  10/03/23 2059  Location: Vertebral column  Location Orientation: Lower  Staging: Deep Tissue Pressure Injury - Purple or maroon localized area of discolored intact skin or blood-filled blister due to damage of underlying soft tissue  from pressure and/or shear.  Wound Description (Comments): Area of purplish discoloration above stage 2 on sacrum  Present on Admission: Yes  Dressing Type Foam - Lift dressing to assess site every shift 10/06/23 0958    Consultants:  None  Procedures:  None  Antimicrobials:  Anti-infectives (From admission, onward)    Start     Dose/Rate Route Frequency Ordered Stop   10/08/23 1000  cefTRIAXone (ROCEPHIN) 2 g in  sodium chloride 0.9 % 100 mL IVPB  Status:  Discontinued        2 g 200 mL/hr over 30 Minutes Intravenous Every 24 hours 10/07/23 1045 10/07/23 1117   10/08/23 1000  azithromycin (ZITHROMAX) 500 mg in sodium chloride 0.9 % 250 mL IVPB        500 mg 250 mL/hr over 60 Minutes Intravenous Every 24 hours 10/07/23 1104 10/11/23 1031   10/07/23 2200  piperacillin-tazobactam (ZOSYN) IVPB 3.375 g        3.375 g 12.5 mL/hr over 240 Minutes Intravenous Every 8 hours 10/07/23 1121     10/07/23 1130  piperacillin-tazobactam (ZOSYN) IVPB 3.375 g        3.375 g 100 mL/hr over 30 Minutes Intravenous Once 10/07/23 1118 10/07/23 1311   10/07/23 1100  cefTRIAXone (ROCEPHIN) 1 g in sodium chloride 0.9 % 100 mL IVPB  Status:  Discontinued       Note to Pharmacy: Should have been 2 g   1 g 200 mL/hr over 30 Minutes Intravenous  Once 10/07/23 1045 10/07/23 1117   10/07/23 0930  cefTRIAXone (ROCEPHIN) 1 g in sodium chloride 0.9 % 100 mL IVPB        1 g 200 mL/hr over 30 Minutes Intravenous  Once 10/07/23 0919 10/07/23 1136   10/07/23 0930  azithromycin (ZITHROMAX) 500 mg in sodium chloride 0.9 % 250 mL IVPB        500 mg 250 mL/hr over 60 Minutes Intravenous  Once 10/07/23 0919 10/07/23 1242         Subjective: Patient seen and examined.  No complaints.  Appears comfortable.  Objective: Vitals:   10/10/23 0812 10/10/23 1941 10/10/23 1942 10/11/23 0442  BP: (!) 152/71  125/73 (!) 144/94  Pulse: 80 71 68 (!) 53  Resp: 18  16 19   Temp: 98.5 F (36.9 C)  98.8 F (37.1 C) 98 F (36.7 C)  TempSrc: Oral  Oral Oral  SpO2: 96% 93% 93% 94%  Weight:    81 kg    Intake/Output Summary (Last 24 hours) at 10/11/2023 1136 Last data filed at 10/11/2023 0447 Gross per 24 hour  Intake 360 ml  Output 1150 ml  Net -790 ml   Filed Weights   10/09/23 0420 10/10/23 0353 10/11/23 0442  Weight: 81.3 kg 81.1 kg 81 kg    Examination:  General exam: Appears calm and comfortable  Respiratory system: Clear to  auscultation. Respiratory effort normal. Cardiovascular system: S1 & S2 heard, RRR. No JVD, murmurs, rubs, gallops or clicks. No pedal edema. Gastrointestinal system: Abdomen is nondistended, soft and nontender. No organomegaly or masses felt. Normal bowel sounds heard. Central nervous system: Alert and orientedx1. No focal neurological deficits. Extremities: Symmetric 5 x 5 power. Skin: No rashes, lesions or ulcers.    Data Reviewed: I have personally reviewed following labs and imaging studies  CBC: Recent Labs  Lab 10/05/23 0242 10/07/23 0817 10/07/23 0823 10/08/23 0431 10/10/23 0854  WBC 5.7 18.1*  --  13.0* 7.6  NEUTROABS 3.9  --   --   --  5.6  HGB 9.5* 11.9* 12.9* 9.3* 8.6*  HCT 28.7* 36.0* 38.0* 28.8* 26.5*  MCV 92.9 93.3  --  92.9 92.3  PLT 101* 138*  --  117* 112*   Basic Metabolic Panel: Recent Labs  Lab 10/05/23 0242 10/06/23 0323 10/07/23 0817 10/07/23 0823 10/08/23 0431 10/10/23 0854  NA 137 134* 134* 139 138 139  K 3.7 3.7 3.6 3.8 3.6 3.7  CL 97* 96* 95*  --  102 101  CO2 31 30 26   --  26 27  GLUCOSE 91 90 111*  --  100* 99  BUN 19 19 23   --  25* 17  CREATININE 1.53* 1.48* 1.55*  --  1.73* 1.56*  CALCIUM 8.7* 8.7* 9.3  --  8.8* 9.0   GFR: Estimated Creatinine Clearance: 27.7 mL/min (A) (by C-G formula based on SCr of 1.56 mg/dL (H)). Liver Function Tests: Recent Labs  Lab 10/07/23 0817  AST 24  ALT 20  ALKPHOS 127*  BILITOT 1.0  PROT 8.1  ALBUMIN 3.1*   No results for input(s): "LIPASE", "AMYLASE" in the last 168 hours. No results for input(s): "AMMONIA" in the last 168 hours. Coagulation Profile: No results for input(s): "INR", "PROTIME" in the last 168 hours.  Cardiac Enzymes: No results for input(s): "CKTOTAL", "CKMB", "CKMBINDEX", "TROPONINI" in the last 168 hours. BNP (last 3 results) Recent Labs    02/04/23 1619 08/26/23 1526  PROBNP 1,402* 1,981*   HbA1C: No results for input(s): "HGBA1C" in the last 72 hours. CBG: Recent  Labs  Lab 10/08/23 1623  GLUCAP 99   Lipid Profile: No results for input(s): "CHOL", "HDL", "LDLCALC", "TRIG", "CHOLHDL", "LDLDIRECT" in the last 72 hours. Thyroid Function Tests: No results for input(s): "TSH", "T4TOTAL", "FREET4", "T3FREE", "THYROIDAB" in the last 72 hours. Anemia Panel: No results for input(s): "VITAMINB12", "FOLATE", "FERRITIN", "TIBC", "IRON", "RETICCTPCT" in the last 72 hours. Sepsis Labs: Recent Labs  Lab 10/07/23 0817 10/07/23 0915 10/07/23 0945  PROCALCITON 0.12  --   --   LATICACIDVEN  --  1.8 1.5    Recent Results (from the past 240 hour(s))  Blood Culture (routine x 2)     Status: None   Collection Time: 10/03/23  2:55 PM   Specimen: BLOOD  Result Value Ref Range Status   Specimen Description BLOOD SITE NOT SPECIFIED  Final   Special Requests   Final    BOTTLES DRAWN AEROBIC AND ANAEROBIC Blood Culture results may not be optimal due to an excessive volume of blood received in culture bottles   Culture   Final    NO GROWTH 5 DAYS Performed at [facility], [address] [place], [place] [address]    Report Status 10/08/2023 FINAL  Final  SARS Coronavirus 2 by RT PCR (hospital order, performed in [facility] hospital lab) *cepheid single result test* Anterior Nasal Swab     Status: None   Collection Time: 10/03/23  3:24 PM   Specimen: Anterior Nasal Swab  Result Value Ref Range Status   SARS Coronavirus 2 by RT PCR NEGATIVE NEGATIVE Final    Comment: Performed at [facility], [address], [place], [place] [address]  Blood Culture (routine x 2)     Status: None   Collection Time: 10/03/23  3:30 PM   Specimen: BLOOD  Result Value Ref Range Status   Specimen Description BLOOD SITE NOT SPECIFIED  Final   Special Requests   Final    BOTTLES DRAWN AEROBIC AND ANAEROBIC Blood  Culture results may not be optimal due to an excessive volume of blood received in culture bottles   Culture   Final    NO GROWTH 5 DAYS Performed at [facility], [address] [place], [place] [address]    Report Status 10/08/2023 FINAL  Final  Urine Culture     Status: Abnormal   Collection Time: 10/03/23  3:30 PM   Specimen: Urine, Catheterized  Result Value Ref Range Status   Specimen Description URINE, CATHETERIZED  Final   Special Requests   Final    NONE Reflexed from [id number] Performed at [facility], [address], [place], [place] [address]    Culture (A)  Final    >=100,000 COLONIES/mL PROTEUS MIRABILIS >=100,000 COLONIES/mL MORGANELLA MORGANII    Report Status 10/06/2023 FINAL  Final   Organism ID, Bacteria PROTEUS MIRABILIS (A)  Final   Organism ID, Bacteria MORGANELLA MORGANII (A)  Final      Susceptibility   Morganella morganii - MIC*    AMPICILLIN >=32 RESISTANT Resistant     CIPROFLOXACIN <=0.25 SENSITIVE Sensitive     GENTAMICIN <=1 SENSITIVE Sensitive     IMIPENEM 2 SENSITIVE Sensitive     NITROFURANTOIN >=512 RESISTANT Resistant     TRIMETH/SULFA <=20 SENSITIVE Sensitive     AMPICILLIN/SULBACTAM >=32 RESISTANT Resistant     PIP/TAZO <=4 SENSITIVE Sensitive ug/mL    * >=100,000 COLONIES/mL MORGANELLA MORGANII   Proteus mirabilis - MIC*    AMPICILLIN <=2 SENSITIVE Sensitive     CEFAZOLIN <=4 SENSITIVE Sensitive     CEFEPIME <=0.12 SENSITIVE Sensitive     CEFTRIAXONE <=0.25 SENSITIVE Sensitive     CIPROFLOXACIN <=0.25 SENSITIVE Sensitive     GENTAMICIN <=1 SENSITIVE Sensitive     IMIPENEM 2 SENSITIVE Sensitive     NITROFURANTOIN 128 RESISTANT Resistant     TRIMETH/SULFA <=20 SENSITIVE Sensitive     AMPICILLIN/SULBACTAM <=2 SENSITIVE Sensitive     PIP/TAZO <=4 SENSITIVE Sensitive ug/mL    * >=100,000 COLONIES/mL PROTEUS MIRABILIS  Resp panel by RT-PCR (RSV, Flu A&B, Covid) Anterior Nasal Swab     Status: None   Collection Time: 10/07/23  8:33 AM   Specimen: Anterior Nasal Swab  Result Value Ref Range Status   SARS Coronavirus 2 by RT PCR NEGATIVE NEGATIVE Final   Influenza A by PCR NEGATIVE  NEGATIVE Final   Influenza B by PCR NEGATIVE NEGATIVE Final    Comment: (NOTE) The Xpert Xpress SARS-CoV-2/FLU/RSV plus assay is intended as an aid in the diagnosis of influenza from Nasopharyngeal swab specimens and should not be used as a sole basis for treatment. Nasal washings and aspirates are unacceptable for Xpert Xpress SARS-CoV-2/FLU/RSV testing.  Fact Sheet for Patients: BloggerCourse.com  Fact Sheet for Healthcare Providers: SeriousBroker.it  This test is not yet approved or cleared by the [place] FDA and has been authorized for detection and/or diagnosis of SARS-CoV-2 by FDA under an Emergency Use Authorization (EUA). This EUA will remain in effect (meaning this test can be used) for the duration of the COVID-19 declaration under Section 564(b)(1) of the Act, 21 U.S.C. section 360bbb-3(b)(1), unless the authorization is terminated or revoked.     Resp Syncytial Virus by PCR NEGATIVE NEGATIVE Final    Comment: (NOTE) Fact Sheet for Patients: BloggerCourse.com  Fact Sheet for Healthcare Providers: SeriousBroker.it  This test is not yet approved or cleared by the [place] FDA and has been authorized for detection and/or diagnosis of SARS-CoV-2 by FDA  under an Emergency Use Authorization (EUA). This EUA will remain in effect (meaning this test can be used) for the duration of the COVID-19 declaration under Section 564(b)(1) of the Act, 21 U.S.C. section 360bbb-3(b)(1), unless the authorization is terminated or revoked.  Performed at [facility], [address], [place], [place] [address]   Blood culture (routine x 2)     Status: None (Preliminary result)   Collection Time: 10/07/23  9:18 AM   Specimen: BLOOD  Result Value Ref Range Status   Specimen Description BLOOD LEFT ANTECUBITAL  Final   Special Requests   Final    BOTTLES DRAWN AEROBIC ONLY  Blood Culture adequate volume   Culture   Final    NO GROWTH 4 DAYS Performed at [facility], [address] [place], [place] [address]    Report Status PENDING  Incomplete  Blood culture (routine x 2)     Status: None (Preliminary result)   Collection Time: 10/07/23  9:23 AM   Specimen: BLOOD  Result Value Ref Range Status   Specimen Description BLOOD RIGHT ANTECUBITAL  Final   Special Requests   Final    BOTTLES DRAWN AEROBIC AND ANAEROBIC Blood Culture adequate volume   Culture   Final    NO GROWTH 4 DAYS Performed at [facility], [address] [place], [place] [address]    Report Status PENDING  Incomplete  Urine Culture     Status: Abnormal   Collection Time: 10/07/23  9:28 AM   Specimen: Urine, Catheterized  Result Value Ref Range Status   Specimen Description URINE, CATHETERIZED  Final   Special Requests NONE  Final   Culture (A)  Final    <10,000 COLONIES/mL INSIGNIFICANT GROWTH Performed at [facility], [address] [place], [place] [address]    Report Status 10/09/2023 FINAL  Final     Radiology Studies: No results found.  Scheduled Meds:  apixaban  2.5 mg Oral BID   Chlorhexidine Gluconate Cloth  6 each Topical Daily   cyanocobalamin  1,000 mcg Oral QODAY   DULoxetine  60 mg Oral Daily   furosemide  40 mg Oral Daily   levothyroxine  25 mcg Oral QAC breakfast   nystatin ointment  1 Application Topical BID   polyethylene glycol  17 g Oral Daily   pregabalin  50 mg Oral QHS   sodium chloride flush  3 mL Intravenous Q12H   traMADol  50 mg Oral BID   Continuous Infusions:  piperacillin-tazobactam (ZOSYN)  IV 3.375 g (10/11/23 0524)     LOS: 4 days   Hughie Closs, MD [facility]  10/11/2023, 11:36 AM   *Please note that this is a verbal dictation therefore any spelling or grammatical errors are due to the "Dragon Medical One" system interpretation.  Please page via Amion and do not message via secure chat for urgent  patient care matters. Secure chat can be used for non urgent patient care matters.  How to contact the [facility] Attending or Consulting provider 7A - 7P or covering provider during after hours 7P -7A, for this patient?  Check the care team in [facility] and look for a) attending/consulting TRH provider listed and b) the [facility] team listed. Page or secure chat 7A-7P. Log into [email or web address] and use [place]'s universal password to access. If you do not have the password, please contact the hospital operator. Locate the [facility] provider you are looking for under [facility] and page  to a number that you can be directly reached. If you still have difficulty reaching the provider, please page the [facility] (Director on Call) for the Hospitalists listed on amion for assistance.

## 2023-10-12 DIAGNOSIS — J9601 Acute respiratory failure with hypoxia: Secondary | ICD-10-CM | POA: Diagnosis not present

## 2023-10-12 LAB — CULTURE, BLOOD (ROUTINE X 2)
Culture: NO GROWTH
Culture: NO GROWTH
Special Requests: ADEQUATE
Special Requests: ADEQUATE

## 2023-10-12 NOTE — Progress Notes (Signed)
Mobility Specialist Progress Note:    10/12/23 1428  Mobility  Activity Ambulated with assistance in hallway  Level of Assistance Contact guard assist, steadying assist  Assistive Device Front wheel walker  Distance Ambulated (ft) 200 ft  Activity Response Tolerated well  Mobility Referral Yes  $Mobility charge 1 Mobility  Mobility Specialist Start Time (ACUTE ONLY) 1345  Mobility Specialist Stop Time (ACUTE ONLY) 1400  Mobility Specialist Time Calculation (min) (ACUTE ONLY) 15 min   Received pt in bed having no complaints and agreeable to mobility. Pt was asymptomatic throughout ambulation. Ambulated on 2L/min, VSS, No SOB. Returned to room w/o fault. Left seated EOB w/ call bell in reach and all needs met. Bed alarm on.   Thompson Grayer Mobility Specialist  Please contact vis Secure Chat or  Rehab Office [phone]

## 2023-10-12 NOTE — TOC Progression Note (Signed)
Transition of Care [facility]) - Progression Note    Patient Details  Name: MARQUEST GUNKEL MRN: [id number] Date of Birth: 1925-09-28  Transition of Care [facility]) CM/SW Contact  Patrice Paradise, LCSW Phone Number: 10/12/2023, 11:49 AM  Clinical Narrative:     CSW spopke with pt's son Brett Canales and confirmed bed offer at [facility]. CSW attempted to call [facility] to confirm bed and had to leave voice mail.  TOC team will continue to assist with discharge planning needs.   Expected Discharge Plan: Home w Home Health Services Barriers to Discharge: Continued Medical Work up  Expected Discharge Plan and Services In-house Referral: Clinical Social Work Discharge Planning Services: CM Consult Post Acute Care Choice: Home Health, Resumption of Svcs/PTA Provider Living arrangements for the past 2 months: Single Family Home                           HH Arranged: PT, RN, Nurse's Aide HH Agency: [facility] Date [facility] Agency Contacted: 10/10/23 Time HH Agency Contacted: 1059 Representative spoke with at [facility] Agency: Kandee Keen   Social Determinants of Health (SDOH) Interventions SDOH Screenings   Food Insecurity: No Food Insecurity (10/07/2023)  Housing: Low Risk  (10/07/2023)  Transportation Needs: No Transportation Needs (10/07/2023)  Utilities: Not At Risk (10/07/2023)  Depression (PHQ2-9): Low Risk  (07/29/2023)  Tobacco Use: Medium Risk (10/08/2023)    Readmission Risk Interventions    10/10/2023   10:25 AM 10/06/2023   12:21 PM 08/04/2023    9:31 AM  Readmission Risk Prevention Plan  Transportation Screening Complete Complete Complete  PCP or Specialist Appt within 3-5 Days   Complete  HRI or Home Care Consult   Complete  Social Work Consult for Recovery Care Planning/Counseling   Complete  Palliative Care Screening   Complete  Medication Review Ocean[facility]) Referral to Pharmacy Complete Complete  PCP or Specialist appointment within 3-5 days of discharge Complete  Complete   HRI or Home Care Consult Complete Complete   SW Recovery Care/Counseling Consult Complete    Palliative Care Screening Not Applicable    Skilled Nursing Facility Complete Not Applicable

## 2023-10-12 NOTE — TOC Progression Note (Signed)
Transition of Care [facility]) - Progression Note    Patient Details  Name: Douglas Walker MRN: [id number] Date of Birth: [birth year]  Transition of Care [facility]) CM/SW Contact  Patrice Paradise, LCSW Phone Number: 10/12/2023, 12:35 PM  Clinical Narrative:     CSW received a calll from pt's DIL- Carla asking about pt's DC plan. CSW confirmed that pt should be discharged to [facility]. Carla asked if insurance authorization has been approved. CSW explained that Berkley Harvey will be started today therefore should hear something tomorrow.   Albin Felling explained that she is concerned that if pt needs to come home that he will not have proper equipment. Albin Felling states that a hospital bed would be needed and she is requesting a MD order for home O2. CSW explained that she would make note of request however pt has not been denied for SNF yet.  CSW started [place] for tomorrow reference # [id number]. Facility would need to be called again and bed confirmed.   TOC team will continue to assist with discharge planning needs.  Expected Discharge Plan: Home w Home Health Services Barriers to Discharge: Continued Medical Work up  Expected Discharge Plan and Services In-house Referral: Clinical Social Work Discharge Planning Services: CM Consult Post Acute Care Choice: Home Health, Resumption of Svcs/PTA Provider Living arrangements for the past 2 months: Single Family Home                           HH Arranged: PT, RN, Nurse's Aide HH Agency: [facility] Date [facility] Agency Contacted: 10/10/23 Time HH Agency Contacted: 1059 Representative spoke with at [facility] Agency: Kandee Keen   Social Determinants of Health (SDOH) Interventions SDOH Screenings   Food Insecurity: No Food Insecurity (10/07/2023)  Housing: Low Risk  (10/07/2023)  Transportation Needs: No Transportation Needs (10/07/2023)  Utilities: Not At Risk (10/07/2023)  Depression (PHQ2-9): Low Risk  (07/29/2023)  Tobacco Use: Medium Risk (10/08/2023)     Readmission Risk Interventions    10/10/2023   10:25 AM 10/06/2023   12:21 PM 08/04/2023    9:31 AM  Readmission Risk Prevention Plan  Transportation Screening Complete Complete Complete  PCP or Specialist Appt within 3-5 Days   Complete  HRI or Home Care Consult   Complete  Social Work Consult for Recovery Care Planning/Counseling   Complete  Palliative Care Screening   Complete  Medication Review Oceanographer) Referral to Pharmacy Complete Complete  PCP or Specialist appointment within 3-5 days of discharge Complete Complete   HRI or Home Care Consult Complete Complete   SW Recovery Care/Counseling Consult Complete    Palliative Care Screening Not Applicable    Skilled Nursing Facility Complete Not Applicable

## 2023-10-12 NOTE — Progress Notes (Signed)
PROGRESS NOTE    Douglas Walker  NUU:[id number] DOB: [birth year] DOA: 10/07/2023 PCP: Georgann Housekeeper, MD   Brief Narrative:  89 y.o. male with medical history significant of hypertension, hyperlipidemia, paroxysmal atrial fibrillation on Eliquis, heart failure with preserved EF (50 to 55%, tachybradycardia syndrome s/p PPM, CKD stage III, pancytopenia, hypothyroidism, essential tremor, recurrent UTIs on Macrobid, BPH with chronic indwelling Foley catheter, and dementia admitted on 10/07/2023 with acute hypoxic respiratory failure secondary to sepsis due to pneumonia   Assessment & Plan:   Principal Problem:   Acute respiratory failure with hypoxia (HCC) Active Problems:   Sepsis due to pneumonia (HCC)   Chronic heart failure with preserved ejection fraction (HCC)   Chronic indwelling Foley catheter   Complicated urinary tract infection   Paroxysmal atrial fibrillation (HCC)   Chronic kidney disease, stage III (moderate) (HCC)   Dysphagia, post-stroke   Pancytopenia (HCC)   Hypothyroidism   Chronic pain   Dementia (HCC)  1) severe sepsis secondary to pneumonia and possible UTI--POA: Patient met criteria for severe sepsis based on fever 101.4, tachypnea and leukocytosis as well as acute hypoxic respiratory failure. Urine Legionella antigen negative -Received Zosyn and azithromycin and completed course, all cultures returned negative.  Leukocytosis improving and he is afebrile since admission.    2) acute hypoxic respiratory failure secondary to community-acquired pneumonia--initially required BiPAP.  Currently on 1.5 L oxygen and saturating 94%, continue to wean, he needs frequent reminders to use incentive spirometry.  I have sent a message to RN again today.   3)??CAUTI--history of recurrent UTIs, with chronic indwelling Foley -Patient came here with Foley -Recent urine culture from 10/03/2023 with Morganella morganii and Proteus---both were sensitive to ciprofloxacin and Zosyn  respectively.  Patient received Zosyn and azithromycin during this hospitalization.   4)Paroxysmal atrial fibrillation on chronic anticoagulation Tachybradycardia syndrome s/p PPM -Patient is not on AV nodal blocking agents -Continue apixaban for stroke prophylaxis   5) chronic anemia/thrombocytopenia--indices appears close to recent baseline -No bleeding concerns continue to monitor   6) hypothyroidism--continue levothyroxine   7) dementia--- patient very hard of hearing, unsure if he is aware where he is at.  Hard to communicate.  Reorient, supportive care, delirium precautions   8) chronic pain syndrome--- continue Cymbalta, may use tramadol as needed  Acute on chronic congestive heart failure with midrange ejection fraction: Echo done just 5 days ago shows EF of 50 to 55%, chest x-ray shows pulmonary edema but BNP almost at his baseline.  I did not hear any crackles.  Continue home dose of Lasix.  CKD stage IIIb: At baseline, creatinine 1.5-1.7.  Generalized weakness: Evaluated by PT OT, SNF recommended, TOC working on placement.  DVT prophylaxis: apixaban (ELIQUIS) tablet 2.5 mg Start: 10/07/23 2200   Code Status: Limited: Do not attempt resuscitation (DNR) -DNR-LIMITED -Do Not Intubate/DNI   Family Communication:  None present at bedside.  Lengthy discussion with patient's son at the bedside in the afternoon during my second visit yesterday.  Status is: Inpatient Remains inpatient appropriate because: Patient is medically stable, pending placement to SNF.   Estimated body mass index is 25.85 kg/m as calculated from the following:   Height as of 10/04/23: 5\' 9"  (1.753 m).   Weight as of this encounter: 79.4 kg.  Pressure Injury 07/30/23 Sacrum Stage 2 -  Partial thickness loss of dermis presenting as a shallow open injury with a red, pink wound bed without slough. (Active)  07/30/23 1156  Location: Sacrum  Location Orientation:  Staging: Stage 2 -  Partial thickness loss  of dermis presenting as a shallow open injury with a red, pink wound bed without slough.  Wound Description (Comments):   Present on Admission: Yes  Dressing Type Foam - Lift dressing to assess site every shift 10/06/23 0958     Pressure Injury 07/30/23 Hip Right Unstageable - Full thickness tissue loss in which the base of the injury is covered by slough (yellow, tan, gray, green or brown) and/or eschar (tan, brown or black) in the wound bed. (Active)  07/30/23 1156  Location: Hip  Location Orientation: Right  Staging: Unstageable - Full thickness tissue loss in which the base of the injury is covered by slough (yellow, tan, gray, green or brown) and/or eschar (tan, brown or black) in the wound bed.  Wound Description (Comments):   Present on Admission: Yes  Dressing Type Foam - Lift dressing to assess site every shift 10/06/23 0958     Pressure Injury 10/03/23 Vertebral column Lower Deep Tissue Pressure Injury - Purple or maroon localized area of discolored intact skin or blood-filled blister due to damage of underlying soft tissue from pressure and/or shear. Area of purplish discolora (Active)  10/03/23 2059  Location: Vertebral column  Location Orientation: Lower  Staging: Deep Tissue Pressure Injury - Purple or maroon localized area of discolored intact skin or blood-filled blister due to damage of underlying soft tissue from pressure and/or shear.  Wound Description (Comments): Area of purplish discoloration above stage 2 on sacrum  Present on Admission: Yes  Dressing Type Foam - Lift dressing to assess site every shift 10/06/23 0958   Nutritional Assessment: Body mass index is 25.85 kg/m.Marland Kitchen Seen by dietician.  I agree with the assessment and plan as outlined below: Nutrition Status:        . Skin Assessment: I have examined the patient's skin and I agree with the wound assessment as performed by the wound care RN as outlined below: Pressure Injury 07/30/23 Sacrum Stage 2 -   Partial thickness loss of dermis presenting as a shallow open injury with a red, pink wound bed without slough. (Active)  07/30/23 1156  Location: Sacrum  Location Orientation:   Staging: Stage 2 -  Partial thickness loss of dermis presenting as a shallow open injury with a red, pink wound bed without slough.  Wound Description (Comments):   Present on Admission: Yes  Dressing Type Foam - Lift dressing to assess site every shift 10/06/23 0958     Pressure Injury 07/30/23 Hip Right Unstageable - Full thickness tissue loss in which the base of the injury is covered by slough (yellow, tan, gray, green or brown) and/or eschar (tan, brown or black) in the wound bed. (Active)  07/30/23 1156  Location: Hip  Location Orientation: Right  Staging: Unstageable - Full thickness tissue loss in which the base of the injury is covered by slough (yellow, tan, gray, green or brown) and/or eschar (tan, brown or black) in the wound bed.  Wound Description (Comments):   Present on Admission: Yes  Dressing Type Foam - Lift dressing to assess site every shift 10/06/23 0958     Pressure Injury 10/03/23 Vertebral column Lower Deep Tissue Pressure Injury - Purple or maroon localized area of discolored intact skin or blood-filled blister due to damage of underlying soft tissue from pressure and/or shear. Area of purplish discolora (Active)  10/03/23 2059  Location: Vertebral column  Location Orientation: Lower  Staging: Deep Tissue Pressure Injury - Purple or maroon  localized area of discolored intact skin or blood-filled blister due to damage of underlying soft tissue from pressure and/or shear.  Wound Description (Comments): Area of purplish discoloration above stage 2 on sacrum  Present on Admission: Yes  Dressing Type Foam - Lift dressing to assess site every shift 10/06/23 0958    Consultants:  None  Procedures:  None  Antimicrobials:  Anti-infectives (From admission, onward)    Start     Dose/Rate  Route Frequency Ordered Stop   10/08/23 1000  cefTRIAXone (ROCEPHIN) 2 g in sodium chloride 0.9 % 100 mL IVPB  Status:  Discontinued        2 g 200 mL/hr over 30 Minutes Intravenous Every 24 hours 10/07/23 1045 10/07/23 1117   10/08/23 1000  azithromycin (ZITHROMAX) 500 mg in sodium chloride 0.9 % 250 mL IVPB        500 mg 250 mL/hr over 60 Minutes Intravenous Every 24 hours 10/07/23 1104 10/11/23 1031   10/07/23 2200  piperacillin-tazobactam (ZOSYN) IVPB 3.375 g        3.375 g 12.5 mL/hr over 240 Minutes Intravenous Every 8 hours 10/07/23 1121 10/12/23 0559   10/07/23 1130  piperacillin-tazobactam (ZOSYN) IVPB 3.375 g        3.375 g 100 mL/hr over 30 Minutes Intravenous Once 10/07/23 1118 10/07/23 1311   10/07/23 1100  cefTRIAXone (ROCEPHIN) 1 g in sodium chloride 0.9 % 100 mL IVPB  Status:  Discontinued       Note to Pharmacy: Should have been 2 g   1 g 200 mL/hr over 30 Minutes Intravenous  Once 10/07/23 1045 10/07/23 1117   10/07/23 0930  cefTRIAXone (ROCEPHIN) 1 g in sodium chloride 0.9 % 100 mL IVPB        1 g 200 mL/hr over 30 Minutes Intravenous  Once 10/07/23 0919 10/07/23 1136   10/07/23 0930  azithromycin (ZITHROMAX) 500 mg in sodium chloride 0.9 % 250 mL IVPB        500 mg 250 mL/hr over 60 Minutes Intravenous  Once 10/07/23 0919 10/07/23 1242         Subjective: Patient seen and examined.  No complaints.  Objective: Vitals:   10/11/23 1148 10/11/23 1944 10/12/23 0337 10/12/23 1122  BP: (!) 145/73 (!) 144/68 (!) 148/70 (!) 142/70  Pulse: 68 69 (!) 52 63  Resp: 18 15 16 16   Temp: 98.2 F (36.8 C) 98.7 F (37.1 C) 97.9 F (36.6 C) 97.8 F (36.6 C)  TempSrc: Oral Oral Oral Oral  SpO2: 91% 94% 93% 91%  Weight:   79.4 kg     Intake/Output Summary (Last 24 hours) at 10/12/2023 1410 Last data filed at 10/12/2023 1324 Gross per 24 hour  Intake 68.47 ml  Output 2900 ml  Net -2831.53 ml   Filed Weights   10/10/23 0353 10/11/23 0442 10/12/23 0337  Weight: 81.1  kg 81 kg 79.4 kg    Examination:  General exam: Appears calm and comfortable, very hard of hearing Respiratory system: Clear to auscultation. Respiratory effort normal. Cardiovascular system: S1 & S2 heard, RRR. No JVD, murmurs, rubs, gallops or clicks. No pedal edema. Gastrointestinal system: Abdomen is nondistended, soft and nontender. No organomegaly or masses felt. Normal bowel sounds heard. Central nervous system: Alert and orientedx1. No focal neurological deficits. Extremities: Symmetric 5 x 5 power. Skin: No rashes, lesions or ulcers.     Data Reviewed: I have personally reviewed following labs and imaging studies  CBC: Recent Labs  Lab 10/07/23 0817 10/07/23  0865 10/08/23 0431 10/10/23 0854  WBC 18.1*  --  13.0* 7.6  NEUTROABS  --   --   --  5.6  HGB 11.9* 12.9* 9.3* 8.6*  HCT 36.0* 38.0* 28.8* 26.5*  MCV 93.3  --  92.9 92.3  PLT 138*  --  117* 112*   Basic Metabolic Panel: Recent Labs  Lab 10/06/23 0323 10/07/23 0817 10/07/23 0823 10/08/23 0431 10/10/23 0854  NA 134* 134* 139 138 139  K 3.7 3.6 3.8 3.6 3.7  CL 96* 95*  --  102 101  CO2 30 26  --  26 27  GLUCOSE 90 111*  --  100* 99  BUN 19 23  --  25* 17  CREATININE 1.48* 1.55*  --  1.73* 1.56*  CALCIUM 8.7* 9.3  --  8.8* 9.0   GFR: Estimated Creatinine Clearance: 27.7 mL/min (A) (by C-G formula based on SCr of 1.56 mg/dL (H)). Liver Function Tests: Recent Labs  Lab 10/07/23 0817  AST 24  ALT 20  ALKPHOS 127*  BILITOT 1.0  PROT 8.1  ALBUMIN 3.1*   No results for input(s): "LIPASE", "AMYLASE" in the last 168 hours. No results for input(s): "AMMONIA" in the last 168 hours. Coagulation Profile: No results for input(s): "INR", "PROTIME" in the last 168 hours.  Cardiac Enzymes: No results for input(s): "CKTOTAL", "CKMB", "CKMBINDEX", "TROPONINI" in the last 168 hours. BNP (last 3 results) Recent Labs    02/04/23 1619 08/26/23 1526  PROBNP 1,402* 1,981*   HbA1C: No results for input(s):  "HGBA1C" in the last 72 hours. CBG: Recent Labs  Lab 10/08/23 1623  GLUCAP 99   Lipid Profile: No results for input(s): "CHOL", "HDL", "LDLCALC", "TRIG", "CHOLHDL", "LDLDIRECT" in the last 72 hours. Thyroid Function Tests: No results for input(s): "TSH", "T4TOTAL", "FREET4", "T3FREE", "THYROIDAB" in the last 72 hours. Anemia Panel: No results for input(s): "VITAMINB12", "FOLATE", "FERRITIN", "TIBC", "IRON", "RETICCTPCT" in the last 72 hours. Sepsis Labs: Recent Labs  Lab 10/07/23 0817 10/07/23 0915 10/07/23 0945  PROCALCITON 0.12  --   --   LATICACIDVEN  --  1.8 1.5    Recent Results (from the past 240 hour(s))  Blood Culture (routine x 2)     Status: None   Collection Time: 10/03/23  2:55 PM   Specimen: BLOOD  Result Value Ref Range Status   Specimen Description BLOOD SITE NOT SPECIFIED  Final   Special Requests   Final    BOTTLES DRAWN AEROBIC AND ANAEROBIC Blood Culture results may not be optimal due to an excessive volume of blood received in culture bottles   Culture   Final    NO GROWTH 5 DAYS Performed at [facility], [address] [place], [place] [address]    Report Status 10/08/2023 FINAL  Final  SARS Coronavirus 2 by RT PCR (hospital order, performed in [facility] hospital lab) *cepheid single result test* Anterior Nasal Swab     Status: None   Collection Time: 10/03/23  3:24 PM   Specimen: Anterior Nasal Swab  Result Value Ref Range Status   SARS Coronavirus 2 by RT PCR NEGATIVE NEGATIVE Final    Comment: Performed at [facility], [address] [place], [place] [address]  Blood Culture (routine x 2)     Status: None   Collection Time: 10/03/23  3:30 PM   Specimen: BLOOD  Result Value Ref Range Status   Specimen Description BLOOD SITE NOT SPECIFIED  Final   Special Requests  Final    BOTTLES DRAWN AEROBIC AND ANAEROBIC Blood Culture results may not be optimal due to an excessive volume of blood received in culture bottles   Culture    Final    NO GROWTH 5 DAYS Performed at [facility], [address], [place], [place] [address]    Report Status 10/08/2023 FINAL  Final  Urine Culture     Status: Abnormal   Collection Time: 10/03/23  3:30 PM   Specimen: Urine, Catheterized  Result Value Ref Range Status   Specimen Description URINE, CATHETERIZED  Final   Special Requests   Final    NONE Reflexed from [id number] Performed at [facility], [address] [place], [place] [address]    Culture (A)  Final    >=100,000 COLONIES/mL PROTEUS MIRABILIS >=100,000 COLONIES/mL MORGANELLA MORGANII    Report Status 10/06/2023 FINAL  Final   Organism ID, Bacteria PROTEUS MIRABILIS (A)  Final   Organism ID, Bacteria MORGANELLA MORGANII (A)  Final      Susceptibility   Morganella morganii - MIC*    AMPICILLIN >=32 RESISTANT Resistant     CIPROFLOXACIN <=0.25 SENSITIVE Sensitive     GENTAMICIN <=1 SENSITIVE Sensitive     IMIPENEM 2 SENSITIVE Sensitive     NITROFURANTOIN >=512 RESISTANT Resistant     TRIMETH/SULFA <=20 SENSITIVE Sensitive     AMPICILLIN/SULBACTAM >=32 RESISTANT Resistant     PIP/TAZO <=4 SENSITIVE Sensitive ug/mL    * >=100,000 COLONIES/mL MORGANELLA MORGANII   Proteus mirabilis - MIC*    AMPICILLIN <=2 SENSITIVE Sensitive     CEFAZOLIN <=4 SENSITIVE Sensitive     CEFEPIME <=0.12 SENSITIVE Sensitive     CEFTRIAXONE <=0.25 SENSITIVE Sensitive     CIPROFLOXACIN <=0.25 SENSITIVE Sensitive     GENTAMICIN <=1 SENSITIVE Sensitive     IMIPENEM 2 SENSITIVE Sensitive     NITROFURANTOIN 128 RESISTANT Resistant     TRIMETH/SULFA <=20 SENSITIVE Sensitive     AMPICILLIN/SULBACTAM <=2 SENSITIVE Sensitive     PIP/TAZO <=4 SENSITIVE Sensitive ug/mL    * >=100,000 COLONIES/mL PROTEUS MIRABILIS  Resp panel by RT-PCR (RSV, Flu A&B, Covid) Anterior Nasal Swab     Status: None   Collection Time: 10/07/23  8:33 AM   Specimen: Anterior Nasal Swab  Result Value Ref Range Status   SARS Coronavirus 2 by RT PCR NEGATIVE  NEGATIVE Final   Influenza A by PCR NEGATIVE NEGATIVE Final   Influenza B by PCR NEGATIVE NEGATIVE Final    Comment: (NOTE) The Xpert Xpress SARS-CoV-2/FLU/RSV plus assay is intended as an aid in the diagnosis of influenza from Nasopharyngeal swab specimens and should not be used as a sole basis for treatment. Nasal washings and aspirates are unacceptable for Xpert Xpress SARS-CoV-2/FLU/RSV testing.  Fact Sheet for Patients: BloggerCourse.com  Fact Sheet for Healthcare Providers: SeriousBroker.it  This test is not yet approved or cleared by the [place] FDA and has been authorized for detection and/or diagnosis of SARS-CoV-2 by FDA under an Emergency Use Authorization (EUA). This EUA will remain in effect (meaning this test can be used) for the duration of the COVID-19 declaration under Section 564(b)(1) of the Act, 21 U.S.C. section 360bbb-3(b)(1), unless the authorization is terminated or revoked.     Resp Syncytial Virus by PCR NEGATIVE NEGATIVE Final    Comment: (NOTE) Fact Sheet for Patients: BloggerCourse.com  Fact Sheet for Healthcare Providers: SeriousBroker.it  This test is not yet approved or cleared by the [place] FDA and has  been authorized for detection and/or diagnosis of SARS-CoV-2 by FDA under an Emergency Use Authorization (EUA). This EUA will remain in effect (meaning this test can be used) for the duration of the COVID-19 declaration under Section 564(b)(1) of the Act, 21 U.S.C. section 360bbb-3(b)(1), unless the authorization is terminated or revoked.  Performed at [facility], [address], [place], [place] [address]   Blood culture (routine x 2)     Status: None   Collection Time: 10/07/23  9:18 AM   Specimen: BLOOD  Result Value Ref Range Status   Specimen Description BLOOD LEFT ANTECUBITAL  Final   Special Requests   Final     BOTTLES DRAWN AEROBIC ONLY Blood Culture adequate volume   Culture   Final    NO GROWTH 5 DAYS Performed at [facility], [address], [place], [place] [address]    Report Status 10/12/2023 FINAL  Final  Blood culture (routine x 2)     Status: None   Collection Time: 10/07/23  9:23 AM   Specimen: BLOOD  Result Value Ref Range Status   Specimen Description BLOOD RIGHT ANTECUBITAL  Final   Special Requests   Final    BOTTLES DRAWN AEROBIC AND ANAEROBIC Blood Culture adequate volume   Culture   Final    NO GROWTH 5 DAYS Performed at [facility], [address], [place], [place] [address]    Report Status 10/12/2023 FINAL  Final  Urine Culture     Status: Abnormal   Collection Time: 10/07/23  9:28 AM   Specimen: Urine, Catheterized  Result Value Ref Range Status   Specimen Description URINE, CATHETERIZED  Final   Special Requests NONE  Final   Culture (A)  Final    <10,000 COLONIES/mL INSIGNIFICANT GROWTH Performed at [facility], [address], [place], [place] [address]    Report Status 10/09/2023 FINAL  Final     Radiology Studies: No results found.  Scheduled Meds:  apixaban  2.5 mg Oral BID   Chlorhexidine Gluconate Cloth  6 each Topical Daily   cyanocobalamin  1,000 mcg Oral QODAY   DULoxetine  60 mg Oral Daily   furosemide  40 mg Oral Daily   levothyroxine  25 mcg Oral QAC breakfast   nystatin ointment  1 Application Topical BID   polyethylene glycol  17 g Oral Daily   pregabalin  50 mg Oral QHS   sodium chloride flush  3 mL Intravenous Q12H   traMADol  50 mg Oral BID   Continuous Infusions:     LOS: 5 days   Hughie Closs, MD [facility]  10/12/2023, 2:10 PM   *Please note that this is a verbal dictation therefore any spelling or grammatical errors are due to the "Dragon Medical One" system interpretation.  Please page via Amion and do not message via secure chat for urgent patient care matters. Secure chat can be used for  non urgent patient care matters.  How to contact the [facility] Attending or Consulting provider 7A - 7P or covering provider during after hours 7P -7A, for this patient?  Check the care team in [facility] and look for a) attending/consulting TRH provider listed and b) the [facility] team listed. Page or secure chat 7A-7P. Log into [email or web address] and use [facility] universal password to access. If you do not have the password, please contact the hospital operator. Locate the [facility] provider you are looking for under [facility] and page to  a number that you can be directly reached. If you still have difficulty reaching the provider, please page the [facility] (Director on Call) for the Hospitalists listed on amion for assistance.

## 2023-10-12 NOTE — Plan of Care (Signed)
?  Problem: Respiratory: ?Goal: Ability to maintain adequate ventilation will improve ?Outcome: Progressing ?Goal: Ability to maintain a clear airway will improve ?Outcome: Progressing ?  ?

## 2023-10-12 NOTE — Progress Notes (Signed)
  Per chart review patient has ESBL associated infection in July 2024. - Ordered contact precaution.   Tereasa Coop, MD [facility] 10/12/2023, 12:05 AM

## 2023-10-13 DIAGNOSIS — J9601 Acute respiratory failure with hypoxia: Secondary | ICD-10-CM | POA: Diagnosis not present

## 2023-10-13 NOTE — Progress Notes (Signed)
Occupational Therapy Treatment Patient Details Name: Douglas Walker MRN: [id number] DOB: 12-03-1925 Today's Date: 10/13/2023   History of present illness Pt is a 89 yo malewho  presents to [facility] on 10/07/2023 with SOB. Pt recently discharged on 10/7 after management of PNA. Pt found to be hypoxic, with pulmonary edema and superimposed PNA. PMH includes AMS with tremors, dementia, hypothyroidism, HTN, macular degeneration, UTIs with indwelling catheter, dysphagia - post stroke, CHF, CKD, PPM.   OT comments  Pt progressing toward established OT goals. RN reporting pt participatory in self feeding with cues this morning. Pt continues to be reliant on supplementary O2 this session and needing CGA and cues for basic mobility with RW. Pt performing seated ADL from recliner without back support to optimize strength and activity tolerance. Needing up to mod cues for visual scanning to the R to locate items and participate in reciprocal communication/collaboration with OT. Continue to recommend HHOT to optimize safety and independence with ADL and IADL.       If plan is discharge home, recommend the following:  A little help with walking and/or transfers;A lot of help with bathing/dressing/bathroom;Assistance with cooking/housework;Direct supervision/assist for medications management;Direct supervision/assist for financial management;Assist for transportation;Help with stairs or ramp for entrance;Supervision due to cognitive status (set-up for ADL and self feeding)   Equipment Recommendations  None recommended by OT    Recommendations for Other Services      Precautions / Restrictions Precautions Precautions: Fall Precaution Comments: watch O2 Restrictions Weight Bearing Restrictions: No       Mobility Bed Mobility Overal bed mobility: Needs Assistance Bed Mobility: Supine to Sit   Sidelying to sit: Supervision, Used rails       General bed mobility comments: for safety     Transfers Overall transfer level: Needs assistance Equipment used: Rolling walker (2 wheels) Transfers: Sit to/from Stand Sit to Stand: Contact guard assist           General transfer comment: cues for hand placement     Balance Overall balance assessment: Needs assistance Sitting-balance support: No upper extremity supported, Feet supported Sitting balance-Leahy Scale: Good     Standing balance support: Bilateral upper extremity supported, During functional activity, Reliant on assistive device for balance Standing balance-Leahy Scale: Poor Standing balance comment: reliant on RW                           ADL either performed or assessed with clinical judgement   ADL Overall ADL's : Needs assistance/impaired     Grooming: Oral care;Set up;Cueing for compensatory techniques;Sitting Grooming Details (indicate cue type and reason): Pt needing orientation for set up and signficantly increased time to comprhend where items are. Slowed speed of sequencing noted and BUE tremor present with active mobility of BUE. Pt needing cues throughout for scanning toward R                 Toilet Transfer: Contact guard assist;Minimal assistance;Rolling walker (2 wheels);BSC/3in1;Stand-pivot Toilet Transfer Details (indicate cue type and reason): SPT to chair with CGA; 1-2 cues for safety.         Functional mobility during ADLs: Contact guard assist;Rolling walker (2 wheels) General ADL Comments: cues for safety and sequecning throughout    Extremity/Trunk Assessment Upper Extremity Assessment Upper Extremity Assessment: Right hand dominant;Generalized weakness   Lower Extremity Assessment Lower Extremity Assessment: Defer to PT evaluation        Vision   Additional Comments: R eye  blindness at baseline. Needing cues for visually scanning to the R during ADL or set up on L, however, session focused on encouraging pt to scan R.   Perception  Perception Perception: Not tested   Praxis Praxis Praxis: Not tested    Cognition Arousal: Alert Behavior During Therapy: [facility] for tasks assessed/performed Overall Cognitive Status: History of cognitive impairments - at baseline                                 General Comments: hx of dementia. Following all one step commands during session with incresaed time. Pt with slow processing of commands and needing cues for problem solving secondary to R eye blindness during ADL despite fair awareness of visual difficulties.        Exercises      Shoulder Instructions       General Comments 3L [place] with SpO2 >90 during basic mobility. As low as 88 following mobility, but pt with quick recovery.    Pertinent Vitals/ Pain       Pain Assessment Pain Assessment: No/denies pain Pain Intervention(s): Limited activity within patient's tolerance, Monitored during session  Home Living                                          Prior Functioning/Environment              Frequency  Min 1X/week        Progress Toward Goals  OT Goals(current goals can now be found in the care plan section)  Progress towards OT goals: Progressing toward goals  Acute Rehab OT Goals Patient Stated Goal: go home OT Goal Formulation: With patient Time For Goal Achievement: 10/24/23 Potential to Achieve Goals: Good ADL Goals Pt Will Perform Upper Body Bathing: with supervision;sitting Pt Will Perform Lower Body Bathing: with supervision;sit to/from stand Pt Will Perform Upper Body Dressing: sitting;with supervision Pt Will Perform Lower Body Dressing: with supervision;sit to/from stand Pt Will Transfer to Toilet: with supervision;regular height toilet Pt Will Perform Toileting - Clothing Manipulation and hygiene: with contact guard assist;sit to/from stand  Plan      Co-evaluation                 AM-PAC OT "6 Clicks" Daily Activity     Outcome Measure    Help from another person eating meals?: A Little Help from another person taking care of personal grooming?: A Little Help from another person toileting, which includes using toliet, bedpan, or urinal?: A Lot Help from another person bathing (including washing, rinsing, drying)?: A Lot Help from another person to put on and taking off regular upper body clothing?: A Little Help from another person to put on and taking off regular lower body clothing?: A Little 6 Click Score: 16    End of Session Equipment Utilized During Treatment: Gait belt;Rolling walker (2 wheels);Oxygen  OT Visit Diagnosis: Unsteadiness on feet (R26.81);Other (comment);Low vision, both eyes (H54.2);Other symptoms and signs involving cognitive function   Activity Tolerance Patient tolerated treatment well   Patient Left in chair;with call bell/phone within reach;with chair alarm set   Nurse Communication Mobility status        Time: [id number] OT Time Calculation (min): 22 min  Charges: OT General Charges $OT Visit: 1 Visit OT Treatments $Self Care/Home Management : 8-22 mins  Tyler Deis, OTR/L [facility] Acute [facility] Office: [phone]   Myrla Halsted 10/13/2023, 9:45 AM

## 2023-10-13 NOTE — TOC Progression Note (Addendum)
Transition of Care [facility]) - Progression Note    Patient Details  Name: TYRIN HERBERS MRN: [id number] Date of Birth: 1925-03-04  [facility] CM/SW Contact  Delilah Shan, LCSWA Phone Number: 10/13/2023, 10:29 AM  Clinical Narrative:     CSW called patients insurance to check on insurance authorization for patient. Patients insurance informed CSW that patients insurance authorization is currently pending. Patient has SNF bed at [facility] when medically ready.  CSW updated patients son Brett Canales.CSW will continue to follow and assist with patients dc planning needs.  Update-4:44pm- CSW called patients insurance to check status of determination. Insurance confirmed patients insurance authorization for SNF still pending. CSW will continue to follow.  Expected Discharge Plan: Home w Home Health Services Barriers to Discharge: Continued Medical Work up  Expected Discharge Plan and Services In-house Referral: Clinical Social Work Discharge Planning Services: CM Consult Post Acute Care Choice: Home Health, Resumption of Svcs/PTA Provider Living arrangements for the past 2 months: Single Family Home                           HH Arranged: PT, RN, Nurse's Aide HH Agency: [facility] Date [facility] Agency Contacted: 10/10/23 Time HH Agency Contacted: 1059 Representative spoke with at [facility] Agency: Kandee Keen   Social Determinants of Health (SDOH) Interventions SDOH Screenings   Food Insecurity: No Food Insecurity (10/07/2023)  Housing: Low Risk  (10/07/2023)  Transportation Needs: No Transportation Needs (10/07/2023)  Utilities: Not At Risk (10/07/2023)  Depression (PHQ2-9): Low Risk  (07/29/2023)  Tobacco Use: Medium Risk (10/08/2023)    Readmission Risk Interventions    10/10/2023   10:25 AM 10/06/2023   12:21 PM 08/04/2023    9:31 AM  Readmission Risk Prevention Plan  Transportation Screening Complete Complete Complete  PCP or Specialist Appt within 3-5 Days    Complete  HRI or Home Care Consult   Complete  Social Work Consult for Recovery Care Planning/Counseling   Complete  Palliative Care Screening   Complete  Medication Review [employer]) Referral to Pharmacy Complete Complete  PCP or Specialist appointment within 3-5 days of discharge Complete Complete   HRI or Home Care Consult Complete Complete   SW Recovery Care/Counseling Consult Complete    Palliative Care Screening Not Applicable    Skilled Nursing Facility Complete Not Applicable

## 2023-10-13 NOTE — Progress Notes (Signed)
PROGRESS NOTE    Douglas Walker  MVH:[id number] DOB: [birth year] DOA: 10/07/2023 PCP: Georgann Housekeeper, MD   Brief Narrative:  89 y.o. male with medical history significant of hypertension, hyperlipidemia, paroxysmal atrial fibrillation on Eliquis, heart failure with preserved EF (50 to 55%, tachybradycardia syndrome s/p PPM, CKD stage III, pancytopenia, hypothyroidism, essential tremor, recurrent UTIs on Macrobid, BPH with chronic indwelling Foley catheter, and dementia admitted on 10/07/2023 with acute hypoxic respiratory failure secondary to sepsis due to pneumonia   Assessment & Plan:   Principal Problem:   Acute respiratory failure with hypoxia (HCC) Active Problems:   Sepsis due to pneumonia (HCC)   Chronic heart failure with preserved ejection fraction (HCC)   Chronic indwelling Foley catheter   Complicated urinary tract infection   Paroxysmal atrial fibrillation (HCC)   Chronic kidney disease, stage III (moderate) (HCC)   Dysphagia, post-stroke   Pancytopenia (HCC)   Hypothyroidism   Chronic pain   Dementia (HCC)  1) severe sepsis secondary to pneumonia and possible UTI--POA: Patient met criteria for severe sepsis based on fever 101.4, tachypnea and leukocytosis as well as acute hypoxic respiratory failure. Urine Legionella antigen negative -Received Zosyn and azithromycin and completed course, all cultures returned negative.  Leukocytosis improving and he is afebrile since admission.    2) acute hypoxic respiratory failure secondary to community-acquired pneumonia--initially required BiPAP.  Currently still requiring 2 L of oxygen with activity.  May need home oxygen.  He needs to use incentive spirometry more often.  Unfortunately, due to dementia, he needs frequent reminders.   3)??CAUTI--history of recurrent UTIs, with chronic indwelling Foley -Patient came here with Foley -Recent urine culture from 10/03/2023 with Morganella morganii and Proteus---both were sensitive to  ciprofloxacin and Zosyn respectively.  Patient received Zosyn and azithromycin during this hospitalization.   4)Paroxysmal atrial fibrillation on chronic anticoagulation Tachybradycardia syndrome s/p PPM -Patient is not on AV nodal blocking agents -Continue apixaban for stroke prophylaxis   5) chronic anemia/thrombocytopenia--indices appears close to recent baseline -No bleeding concerns continue to monitor   6) hypothyroidism--continue levothyroxine   7) dementia--- patient very hard of hearing, unsure if he is aware where he is at.  Hard to communicate.  Reorient, supportive care, delirium precautions   8) chronic pain syndrome--- continue Cymbalta, may use tramadol as needed  Acute on chronic congestive heart failure with midrange ejection fraction: Echo done just 5 days ago shows EF of 50 to 55%, chest x-ray shows pulmonary edema but BNP almost at his baseline.  I did not hear any crackles.  Continue home dose of Lasix.  CKD stage IIIb: At baseline, creatinine 1.5-1.7.  Generalized weakness: Evaluated by PT OT, SNF recommended, TOC working on placement.  Waiting for insurance authorization.  DVT prophylaxis: apixaban (ELIQUIS) tablet 2.5 mg Start: 10/07/23 2200   Code Status: Limited: Do not attempt resuscitation (DNR) -DNR-LIMITED -Do Not Intubate/DNI   Family Communication:  None present at bedside.    Status is: Inpatient Remains inpatient appropriate because: Patient is medically stable, pending placement to SNF.   Estimated body mass index is 25.78 kg/m as calculated from the following:   Height as of this encounter: 5\' 9"  (1.753 m).   Weight as of this encounter: 79.2 kg.  Pressure Injury 07/30/23 Sacrum Stage 2 -  Partial thickness loss of dermis presenting as a shallow open injury with a red, pink wound bed without slough. (Active)  07/30/23 1156  Location: Sacrum  Location Orientation:   Staging: Stage 2 -  Partial  thickness loss of dermis presenting as a shallow  open injury with a red, pink wound bed without slough.  Wound Description (Comments):   Present on Admission: Yes  Dressing Type Foam - Lift dressing to assess site every shift 10/06/23 0958     Pressure Injury 07/30/23 Hip Right Unstageable - Full thickness tissue loss in which the base of the injury is covered by slough (yellow, tan, gray, green or brown) and/or eschar (tan, brown or black) in the wound bed. (Active)  07/30/23 1156  Location: Hip  Location Orientation: Right  Staging: Unstageable - Full thickness tissue loss in which the base of the injury is covered by slough (yellow, tan, gray, green or brown) and/or eschar (tan, brown or black) in the wound bed.  Wound Description (Comments):   Present on Admission: Yes  Dressing Type Foam - Lift dressing to assess site every shift 10/06/23 0958     Pressure Injury 10/03/23 Vertebral column Lower Deep Tissue Pressure Injury - Purple or maroon localized area of discolored intact skin or blood-filled blister due to damage of underlying soft tissue from pressure and/or shear. Area of purplish discolora (Active)  10/03/23 2059  Location: Vertebral column  Location Orientation: Lower  Staging: Deep Tissue Pressure Injury - Purple or maroon localized area of discolored intact skin or blood-filled blister due to damage of underlying soft tissue from pressure and/or shear.  Wound Description (Comments): Area of purplish discoloration above stage 2 on sacrum  Present on Admission: Yes  Dressing Type Foam - Lift dressing to assess site every shift 10/06/23 0958   Nutritional Assessment: Body mass index is 25.78 kg/m.Marland Kitchen Seen by dietician.  I agree with the assessment and plan as outlined below: Nutrition Status:        . Skin Assessment: I have examined the patient's skin and I agree with the wound assessment as performed by the wound care RN as outlined below: Pressure Injury 07/30/23 Sacrum Stage 2 -  Partial thickness loss of dermis  presenting as a shallow open injury with a red, pink wound bed without slough. (Active)  07/30/23 1156  Location: Sacrum  Location Orientation:   Staging: Stage 2 -  Partial thickness loss of dermis presenting as a shallow open injury with a red, pink wound bed without slough.  Wound Description (Comments):   Present on Admission: Yes  Dressing Type Foam - Lift dressing to assess site every shift 10/06/23 0958     Pressure Injury 07/30/23 Hip Right Unstageable - Full thickness tissue loss in which the base of the injury is covered by slough (yellow, tan, gray, green or brown) and/or eschar (tan, brown or black) in the wound bed. (Active)  07/30/23 1156  Location: Hip  Location Orientation: Right  Staging: Unstageable - Full thickness tissue loss in which the base of the injury is covered by slough (yellow, tan, gray, green or brown) and/or eschar (tan, brown or black) in the wound bed.  Wound Description (Comments):   Present on Admission: Yes  Dressing Type Foam - Lift dressing to assess site every shift 10/06/23 0958     Pressure Injury 10/03/23 Vertebral column Lower Deep Tissue Pressure Injury - Purple or maroon localized area of discolored intact skin or blood-filled blister due to damage of underlying soft tissue from pressure and/or shear. Area of purplish discolora (Active)  10/03/23 2059  Location: Vertebral column  Location Orientation: Lower  Staging: Deep Tissue Pressure Injury - Purple or maroon localized area of discolored intact skin  or blood-filled blister due to damage of underlying soft tissue from pressure and/or shear.  Wound Description (Comments): Area of purplish discoloration above stage 2 on sacrum  Present on Admission: Yes  Dressing Type Foam - Lift dressing to assess site every shift 10/06/23 0958    Consultants:  None  Procedures:  None  Antimicrobials:  Anti-infectives (From admission, onward)    Start     Dose/Rate Route Frequency Ordered Stop    10/08/23 1000  cefTRIAXone (ROCEPHIN) 2 g in sodium chloride 0.9 % 100 mL IVPB  Status:  Discontinued        2 g 200 mL/hr over 30 Minutes Intravenous Every 24 hours 10/07/23 1045 10/07/23 1117   10/08/23 1000  azithromycin (ZITHROMAX) 500 mg in sodium chloride 0.9 % 250 mL IVPB        500 mg 250 mL/hr over 60 Minutes Intravenous Every 24 hours 10/07/23 1104 10/11/23 1031   10/07/23 2200  piperacillin-tazobactam (ZOSYN) IVPB 3.375 g        3.375 g 12.5 mL/hr over 240 Minutes Intravenous Every 8 hours 10/07/23 1121 10/12/23 0559   10/07/23 1130  piperacillin-tazobactam (ZOSYN) IVPB 3.375 g        3.375 g 100 mL/hr over 30 Minutes Intravenous Once 10/07/23 1118 10/07/23 1311   10/07/23 1100  cefTRIAXone (ROCEPHIN) 1 g in sodium chloride 0.9 % 100 mL IVPB  Status:  Discontinued       Note to Pharmacy: Should have been 2 g   1 g 200 mL/hr over 30 Minutes Intravenous  Once 10/07/23 1045 10/07/23 1117   10/07/23 0930  cefTRIAXone (ROCEPHIN) 1 g in sodium chloride 0.9 % 100 mL IVPB        1 g 200 mL/hr over 30 Minutes Intravenous  Once 10/07/23 0919 10/07/23 1136   10/07/23 0930  azithromycin (ZITHROMAX) 500 mg in sodium chloride 0.9 % 250 mL IVPB        500 mg 250 mL/hr over 60 Minutes Intravenous  Once 10/07/23 0919 10/07/23 1242         Subjective: Seen and examined.  He has no complaints.  Objective: Vitals:   10/13/23 0302 10/13/23 0707 10/13/23 1143 10/13/23 1206  BP: (!) 145/66   (!) 140/74  Pulse: 69   69  Resp: 16 18  18   Temp: 97.8 F (36.6 C) 97.6 F (36.4 C)  97.9 F (36.6 C)  TempSrc: Oral Oral  Oral  SpO2: 94%   94%  Weight: 79.2 kg     Height:   5\' 9"  (1.753 m)     Intake/Output Summary (Last 24 hours) at 10/13/2023 1450 Last data filed at 10/13/2023 0800 Gross per 24 hour  Intake 240 ml  Output 1250 ml  Net -1010 ml   Filed Weights   10/11/23 0442 10/12/23 0337 10/13/23 0302  Weight: 81 kg 79.4 kg 79.2 kg    Examination:  General exam: Appears calm  and comfortable  Respiratory system: Clear to auscultation. Respiratory effort normal. Cardiovascular system: S1 & S2 heard, RRR. No JVD, murmurs, rubs, gallops or clicks. No pedal edema. Gastrointestinal system: Abdomen is nondistended, soft and nontender. No organomegaly or masses felt. Normal bowel sounds heard. Central nervous system: Alert and oriented to self only. No focal neurological deficits. Extremities: Symmetric 5 x 5 power. Skin: No rashes, lesions or ulcers.    Data Reviewed: I have personally reviewed following labs and imaging studies  CBC: Recent Labs  Lab 10/07/23 0817 10/07/23 0823 10/08/23 0431  10/10/23 0854  WBC 18.1*  --  13.0* 7.6  NEUTROABS  --   --   --  5.6  HGB 11.9* 12.9* 9.3* 8.6*  HCT 36.0* 38.0* 28.8* 26.5*  MCV 93.3  --  92.9 92.3  PLT 138*  --  117* 112*   Basic Metabolic Panel: Recent Labs  Lab 10/07/23 0817 10/07/23 0823 10/08/23 0431 10/10/23 0854  NA 134* 139 138 139  K 3.6 3.8 3.6 3.7  CL 95*  --  102 101  CO2 26  --  26 27  GLUCOSE 111*  --  100* 99  BUN 23  --  25* 17  CREATININE 1.55*  --  1.73* 1.56*  CALCIUM 9.3  --  8.8* 9.0   GFR: Estimated Creatinine Clearance: 27.7 mL/min (A) (by C-G formula based on SCr of 1.56 mg/dL (H)). Liver Function Tests: Recent Labs  Lab 10/07/23 0817  AST 24  ALT 20  ALKPHOS 127*  BILITOT 1.0  PROT 8.1  ALBUMIN 3.1*   No results for input(s): "LIPASE", "AMYLASE" in the last 168 hours. No results for input(s): "AMMONIA" in the last 168 hours. Coagulation Profile: No results for input(s): "INR", "PROTIME" in the last 168 hours.  Cardiac Enzymes: No results for input(s): "CKTOTAL", "CKMB", "CKMBINDEX", "TROPONINI" in the last 168 hours. BNP (last 3 results) Recent Labs    02/04/23 1619 08/26/23 1526  PROBNP 1,402* 1,981*   HbA1C: No results for input(s): "HGBA1C" in the last 72 hours. CBG: Recent Labs  Lab 10/08/23 1623  GLUCAP 99   Lipid Profile: No results for  input(s): "CHOL", "HDL", "LDLCALC", "TRIG", "CHOLHDL", "LDLDIRECT" in the last 72 hours. Thyroid Function Tests: No results for input(s): "TSH", "T4TOTAL", "FREET4", "T3FREE", "THYROIDAB" in the last 72 hours. Anemia Panel: No results for input(s): "VITAMINB12", "FOLATE", "FERRITIN", "TIBC", "IRON", "RETICCTPCT" in the last 72 hours. Sepsis Labs: Recent Labs  Lab 10/07/23 0817 10/07/23 0915 10/07/23 0945  PROCALCITON 0.12  --   --   LATICACIDVEN  --  1.8 1.5    Recent Results (from the past 240 hour(s))  Blood Culture (routine x 2)     Status: None   Collection Time: 10/03/23  2:55 PM   Specimen: BLOOD  Result Value Ref Range Status   Specimen Description BLOOD SITE NOT SPECIFIED  Final   Special Requests   Final    BOTTLES DRAWN AEROBIC AND ANAEROBIC Blood Culture results may not be optimal due to an excessive volume of blood received in culture bottles   Culture   Final    NO GROWTH 5 DAYS Performed at [facility], [address], [place], [place] [address]    Report Status 10/08/2023 FINAL  Final  SARS Coronavirus 2 by RT PCR (hospital order, performed in [facility] hospital lab) *cepheid single result test* Anterior Nasal Swab     Status: None   Collection Time: 10/03/23  3:24 PM   Specimen: Anterior Nasal Swab  Result Value Ref Range Status   SARS Coronavirus 2 by RT PCR NEGATIVE NEGATIVE Final    Comment: Performed at [facility], [address] [place], [place] [address]  Blood Culture (routine x 2)     Status: None   Collection Time: 10/03/23  3:30 PM   Specimen: BLOOD  Result Value Ref Range Status   Specimen Description BLOOD SITE NOT SPECIFIED  Final   Special Requests   Final    BOTTLES DRAWN AEROBIC AND ANAEROBIC Blood Culture results may  not be optimal due to an excessive volume of blood received in culture bottles   Culture   Final    NO GROWTH 5 DAYS Performed at [facility], [address] [place], [place] [address]    Report  Status 10/08/2023 FINAL  Final  Urine Culture     Status: Abnormal   Collection Time: 10/03/23  3:30 PM   Specimen: Urine, Catheterized  Result Value Ref Range Status   Specimen Description URINE, CATHETERIZED  Final   Special Requests   Final    NONE Reflexed from [id number] Performed at [facility], [address] [place], [place] [address]    Culture (A)  Final    >=100,000 COLONIES/mL PROTEUS MIRABILIS >=100,000 COLONIES/mL MORGANELLA MORGANII    Report Status 10/06/2023 FINAL  Final   Organism ID, Bacteria PROTEUS MIRABILIS (A)  Final   Organism ID, Bacteria MORGANELLA MORGANII (A)  Final      Susceptibility   Morganella morganii - MIC*    AMPICILLIN >=32 RESISTANT Resistant     CIPROFLOXACIN <=0.25 SENSITIVE Sensitive     GENTAMICIN <=1 SENSITIVE Sensitive     IMIPENEM 2 SENSITIVE Sensitive     NITROFURANTOIN >=512 RESISTANT Resistant     TRIMETH/SULFA <=20 SENSITIVE Sensitive     AMPICILLIN/SULBACTAM >=32 RESISTANT Resistant     PIP/TAZO <=4 SENSITIVE Sensitive ug/mL    * >=100,000 COLONIES/mL MORGANELLA MORGANII   Proteus mirabilis - MIC*    AMPICILLIN <=2 SENSITIVE Sensitive     CEFAZOLIN <=4 SENSITIVE Sensitive     CEFEPIME <=0.12 SENSITIVE Sensitive     CEFTRIAXONE <=0.25 SENSITIVE Sensitive     CIPROFLOXACIN <=0.25 SENSITIVE Sensitive     GENTAMICIN <=1 SENSITIVE Sensitive     IMIPENEM 2 SENSITIVE Sensitive     NITROFURANTOIN 128 RESISTANT Resistant     TRIMETH/SULFA <=20 SENSITIVE Sensitive     AMPICILLIN/SULBACTAM <=2 SENSITIVE Sensitive     PIP/TAZO <=4 SENSITIVE Sensitive ug/mL    * >=100,000 COLONIES/mL PROTEUS MIRABILIS  Resp panel by RT-PCR (RSV, Flu A&B, Covid) Anterior Nasal Swab     Status: None   Collection Time: 10/07/23  8:33 AM   Specimen: Anterior Nasal Swab  Result Value Ref Range Status   SARS Coronavirus 2 by RT PCR NEGATIVE NEGATIVE Final   Influenza A by PCR NEGATIVE NEGATIVE Final   Influenza B by PCR NEGATIVE NEGATIVE Final     Comment: (NOTE) The Xpert Xpress SARS-CoV-2/FLU/RSV plus assay is intended as an aid in the diagnosis of influenza from Nasopharyngeal swab specimens and should not be used as a sole basis for treatment. Nasal washings and aspirates are unacceptable for Xpert Xpress SARS-CoV-2/FLU/RSV testing.  Fact Sheet for Patients: BloggerCourse.com  Fact Sheet for Healthcare Providers: SeriousBroker.it  This test is not yet approved or cleared by the [place] FDA and has been authorized for detection and/or diagnosis of SARS-CoV-2 by FDA under an Emergency Use Authorization (EUA). This EUA will remain in effect (meaning this test can be used) for the duration of the COVID-19 declaration under Section 564(b)(1) of the Act, 21 U.S.C. section 360bbb-3(b)(1), unless the authorization is terminated or revoked.     Resp Syncytial Virus by PCR NEGATIVE NEGATIVE Final    Comment: (NOTE) Fact Sheet for Patients: BloggerCourse.com  Fact Sheet for Healthcare Providers: SeriousBroker.it  This test is not yet approved or cleared by the [place] FDA and has been authorized for detection and/or diagnosis of SARS-CoV-2 by FDA under an Emergency  Use Authorization (EUA). This EUA will remain in effect (meaning this test can be used) for the duration of the COVID-19 declaration under Section 564(b)(1) of the Act, 21 U.S.C. section 360bbb-3(b)(1), unless the authorization is terminated or revoked.  Performed at [facility], [address], [place], [place] [address]   Blood culture (routine x 2)     Status: None   Collection Time: 10/07/23  9:18 AM   Specimen: BLOOD  Result Value Ref Range Status   Specimen Description BLOOD LEFT ANTECUBITAL  Final   Special Requests   Final    BOTTLES DRAWN AEROBIC ONLY Blood Culture adequate volume   Culture   Final    NO GROWTH 5 DAYS Performed at  [facility], [address] [place], [place] [address]    Report Status 10/12/2023 FINAL  Final  Blood culture (routine x 2)     Status: None   Collection Time: 10/07/23  9:23 AM   Specimen: BLOOD  Result Value Ref Range Status   Specimen Description BLOOD RIGHT ANTECUBITAL  Final   Special Requests   Final    BOTTLES DRAWN AEROBIC AND ANAEROBIC Blood Culture adequate volume   Culture   Final    NO GROWTH 5 DAYS Performed at [facility], [address] [place], [place] [address]    Report Status 10/12/2023 FINAL  Final  Urine Culture     Status: Abnormal   Collection Time: 10/07/23  9:28 AM   Specimen: Urine, Catheterized  Result Value Ref Range Status   Specimen Description URINE, CATHETERIZED  Final   Special Requests NONE  Final   Culture (A)  Final    <10,000 COLONIES/mL INSIGNIFICANT GROWTH Performed at [facility], [address] [place], [place] [address]    Report Status 10/09/2023 FINAL  Final     Radiology Studies: No results found.  Scheduled Meds:  apixaban  2.5 mg Oral BID   Chlorhexidine Gluconate Cloth  6 each Topical Daily   cyanocobalamin  1,000 mcg Oral QODAY   DULoxetine  60 mg Oral Daily   furosemide  40 mg Oral Daily   levothyroxine  25 mcg Oral QAC breakfast   nystatin ointment  1 Application Topical BID   polyethylene glycol  17 g Oral Daily   pregabalin  50 mg Oral QHS   sodium chloride flush  3 mL Intravenous Q12H   traMADol  50 mg Oral BID   Continuous Infusions:     LOS: 6 days   Hughie Closs, MD [facility]  10/13/2023, 2:50 PM   *Please note that this is a verbal dictation therefore any spelling or grammatical errors are due to the "Dragon Medical One" system interpretation.  Please page via Amion and do not message via secure chat for urgent patient care matters. Secure chat can be used for non urgent patient care matters.  How to contact the [facility] Attending or Consulting provider 7A - 7P or covering  provider during after hours 7P -7A, for this patient?  Check the care team in [facility] and look for a) attending/consulting [facility] provider listed and b) the [facility] team listed. Page or secure chat 7A-7P. Log into [email or web address] and use [place]'s universal password to access. If you do not have the password, please contact the hospital operator. Locate the [facility] provider you are looking for under [facility] and page to a number that you can be directly reached. If you still have difficulty  reaching the provider, please page the [facility] (Director on Call) for the Hospitalists listed on amion for assistance.

## 2023-10-13 NOTE — Progress Notes (Signed)
   10/13/23 2115  BiPAP/CPAP/SIPAP  BiPAP/CPAP/SIPAP Pt Type Adult  Reason BIPAP/CPAP not in use Non-compliant  BiPAP/CPAP /SiPAP Vitals  Bilateral Breath Sounds Diminished

## 2023-10-13 NOTE — Progress Notes (Signed)
Patient had a trial of being off his oxygen for 30 mins, as long as he was sitting still he was fine 91-93% on room air. The patient was getting up from the chair back to the bed and he destats to 80-83% put patient back on 2L of oxygen and he has recovered back to 91% at resting state, will continue to monitor patient.

## 2023-10-13 NOTE — Progress Notes (Signed)
Mobility Specialist Progress Note:   10/13/23 1400  Oxygen Therapy  O2 Device Nasal Cannula  O2 Flow Rate (L/min) 2 L/min  Mobility  Activity Ambulated with assistance in hallway  Level of Assistance Contact guard assist, steadying assist  Assistive Device Front wheel walker  Distance Ambulated (ft) 400 ft  Activity Response Tolerated well  Mobility Referral Yes  $Mobility charge 1 Mobility  Mobility Specialist Start Time (ACUTE ONLY) 1417  Mobility Specialist Stop Time (ACUTE ONLY) 1432  Mobility Specialist Time Calculation (min) (ACUTE ONLY) 15 min    Pre Mobility: 72 HR, 93% SpO2 Post Mobility:  70 HR,  90% SpO2  Pt received in bed, agreeable to mobility. Asymptomatic w/ no complaints throughout. Pt left in bed with call bell and all needs met. Bed alarm on.  D'Vante Earlene Plater Mobility Specialist Please contact via Special educational needs teacher or Rehab office at [phone]

## 2023-10-13 NOTE — Progress Notes (Signed)
Hughie Closs MD, wants the patient weaned from 2L of oxygen. The patient is oxygen stats dropped from 94% to 85%.

## 2023-10-13 NOTE — Plan of Care (Signed)
No acute change noted. Will continue to monitor patient .

## 2023-10-14 DIAGNOSIS — J9601 Acute respiratory failure with hypoxia: Secondary | ICD-10-CM | POA: Diagnosis not present

## 2023-10-14 LAB — CBC WITH DIFFERENTIAL/PLATELET
Abs Immature Granulocytes: 0.04 10*3/uL (ref 0.00–0.07)
Basophils Absolute: 0.1 10*3/uL (ref 0.0–0.1)
Basophils Relative: 1 %
Eosinophils Absolute: 0.2 10*3/uL (ref 0.0–0.5)
Eosinophils Relative: 2 %
HCT: 29.9 % — ABNORMAL LOW (ref 39.0–52.0)
Hemoglobin: 9.7 g/dL — ABNORMAL LOW (ref 13.0–17.0)
Immature Granulocytes: 1 %
Lymphocytes Relative: 20 %
Lymphs Abs: 1.5 10*3/uL (ref 0.7–4.0)
MCH: 29.8 pg (ref 26.0–34.0)
MCHC: 32.4 g/dL (ref 30.0–36.0)
MCV: 92 fL (ref 80.0–100.0)
Monocytes Absolute: 0.7 10*3/uL (ref 0.1–1.0)
Monocytes Relative: 9 %
Neutro Abs: 5 10*3/uL (ref 1.7–7.7)
Neutrophils Relative %: 67 %
Platelets: 190 10*3/uL (ref 150–400)
RBC: 3.25 MIL/uL — ABNORMAL LOW (ref 4.22–5.81)
RDW: 15.7 % — ABNORMAL HIGH (ref 11.5–15.5)
WBC: 7.5 10*3/uL (ref 4.0–10.5)
nRBC: 0 % (ref 0.0–0.2)

## 2023-10-14 LAB — BASIC METABOLIC PANEL
Anion gap: 9 (ref 5–15)
BUN: 18 mg/dL (ref 8–23)
CO2: 32 mmol/L (ref 22–32)
Calcium: 9.3 mg/dL (ref 8.9–10.3)
Chloride: 96 mmol/L — ABNORMAL LOW (ref 98–111)
Creatinine, Ser: 1.49 mg/dL — ABNORMAL HIGH (ref 0.61–1.24)
GFR, Estimated: 43 mL/min — ABNORMAL LOW (ref >60)
Glucose, Bld: 118 mg/dL — ABNORMAL HIGH (ref 70–99)
Potassium: 3.6 mmol/L (ref 3.5–5.1)
Sodium: 137 mmol/L (ref 135–145)

## 2023-10-14 MED ORDER — TRAMADOL HCL 50 MG PO TABS: 50.0000 mg | ORAL_TABLET | Freq: Two times a day (BID) | ORAL | 0 refills | Status: DC | PRN

## 2023-10-14 NOTE — Progress Notes (Signed)
Mobility Specialist Progress Note:    10/14/23 1453  Mobility  Activity Ambulated with assistance in hallway  Level of Assistance Contact guard assist, steadying assist  Assistive Device Front wheel walker  Distance Ambulated (ft) 220 ft  Range of Motion/Exercises Active;All extremities  Activity Response Tolerated well  Mobility Referral Yes  $Mobility charge 1 Mobility  Mobility Specialist Start Time (ACUTE ONLY) 1430  Mobility Specialist Stop Time (ACUTE ONLY) 1440  Mobility Specialist Time Calculation (min) (ACUTE ONLY) 10 min   Pt received in bed, family member at bedside. Ambulated in hallway with RW. Tolerated well, asx throughout. Returned pt to bed, all needs met.  Feliciana Rossetti Mobility Specialist Please contact via Special educational needs teacher or  Rehab office at [phone]

## 2023-10-14 NOTE — Care Management Important Message (Signed)
Important Message  Patient Details  Name: ELOHIM BRUNE MRN: [id number] Date of Birth: 04-Feb-1925   Important Message Given:  Yes - Medicare IM     Sherilyn Banker 10/14/2023, 10:26 AM

## 2023-10-14 NOTE — Progress Notes (Signed)
Physical Therapy Treatment Patient Details Name: Douglas Walker MRN: [id number] DOB: [birth year] Today's Date: 10/14/2023   History of Present Illness Pt is a 89 yo malewho  presents to [facility] on 10/07/2023 with SOB. Pt recently discharged on 10/7 after management of PNA. Pt found to be hypoxic, with pulmonary edema and superimposed PNA. PMH includes AMS with tremors, dementia, hypothyroidism, HTN, macular degeneration, UTIs with indwelling catheter, dysphagia - post stroke, CHF, CKD, PPM.    PT Comments  Pt admitted with above diagnosis. Pt was able to ambulate with RW with CGA and cues occasionally. No LOB and pt is aware of when he needs to rest. Sats >90% on 2LO2. Desaturates to 86% on RA.  Will continue to follow pt acutely.  Pt currently with functional limitations due to the deficits listed below (see PT Problem List). Pt will benefit from acute skilled PT to increase their independence and safety with mobility to allow discharge.       If plan is discharge home, recommend the following: A little help with walking and/or transfers;A little help with bathing/dressing/bathroom   Can travel by private vehicle     Yes  Equipment Recommendations  None recommended by PT    Recommendations for Other Services       Precautions / Restrictions Precautions Precautions: Fall Precaution Comments: watch O2 Restrictions Weight Bearing Restrictions: No     Mobility  Bed Mobility Overal bed mobility: Needs Assistance Bed Mobility: Supine to Sit Rolling: Supervision Sidelying to sit: Supervision, Used rails   Sit to supine: Supervision, HOB elevated, Used rails   General bed mobility comments: for safety    Transfers Overall transfer level: Needs assistance Equipment used: Rolling walker (2 wheels) Transfers: Sit to/from Stand Sit to Stand: Contact guard assist           General transfer comment: cues for hand placement    Ambulation/Gait Ambulation/Gait assistance:  Contact guard assist Gait Distance (Feet): 250 Feet Assistive device: Rolling walker (2 wheels) Gait Pattern/deviations: Step-through pattern Gait velocity: reduced Gait velocity interpretation: <1.31 ft/sec, indicative of household ambulator   General Gait Details: slowed step-through gait with slightly flexed posture.  No LOB with RW and aware of when he needed to return to room to rest. Could not tolerate sitting in recliner even with pillow under buttocks.   Stairs             Wheelchair Mobility     Tilt Bed    Modified Rankin (Stroke Patients Only)       Balance Overall balance assessment: Needs assistance Sitting-balance support: No upper extremity supported, Feet supported Sitting balance-Leahy Scale: Good     Standing balance support: Bilateral upper extremity supported, During functional activity, Reliant on assistive device for balance Standing balance-Leahy Scale: Poor Standing balance comment: reliant on RW                            Cognition Arousal: Alert Behavior During Therapy: WFL for tasks assessed/performed Overall Cognitive Status: History of cognitive impairments - at baseline                                 General Comments: hx of dementia. Following all one step commands during session with incresaed time. Pt with slow processing of commands and needing cues for problem solving secondary to R eye blindness  Exercises      General Comments General comments (skin integrity, edema, etc.): 2LO2 with O2 > 90%      Pertinent Vitals/Pain Pain Assessment Pain Assessment: Faces Faces Pain Scale: Hurts whole lot Breathing: normal Negative Vocalization: none Facial Expression: smiling or inexpressive Body Language: relaxed Consolability: no need to console PAINAD Score: 0 Pain Location: back, buttocks Pain Descriptors / Indicators: Aching, Discomfort, Grimacing, Sore Pain Intervention(s): Limited activity  within patient's tolerance, Monitored during session, Repositioned    Home Living                          Prior Function            PT Goals (current goals can now be found in the care plan section) Acute Rehab PT Goals Patient Stated Goal: to return home Progress towards PT goals: Progressing toward goals    Frequency    Min 1X/week      PT Plan      Co-evaluation              AM-PAC PT "6 Clicks" Mobility   Outcome Measure  Help needed turning from your back to your side while in a flat bed without using bedrails?: A Little Help needed moving from lying on your back to sitting on the side of a flat bed without using bedrails?: A Little Help needed moving to and from a bed to a chair (including a wheelchair)?: A Little Help needed standing up from a chair using your arms (e.g., wheelchair or bedside chair)?: A Little Help needed to walk in hospital room?: A Little Help needed climbing 3-5 steps with a railing? : A Little 6 Click Score: 18    End of Session Equipment Utilized During Treatment: Oxygen Activity Tolerance: Patient limited by fatigue Patient left: in bed;with call bell/phone within reach;with bed alarm set Nurse Communication: Mobility status PT Visit Diagnosis: Other abnormalities of gait and mobility (R26.89);Muscle weakness (generalized) (M62.81)     Time: 1610-9604 PT Time Calculation (min) (ACUTE ONLY): 22 min  Charges:    $Gait Training: 8-22 mins PT General Charges $$ ACUTE PT VISIT: 1 Visit                     Timoteo Carreiro M,PT Acute Rehab Services [phone]    Bevelyn Buckles 10/14/2023, 11:55 AM

## 2023-10-14 NOTE — Care Management (Cosign Needed)
    Durable Medical Equipment  (From admission, onward)           Start     Ordered   10/14/23 1525  For home use only DME Hospital bed  Once       Comments: Therapeutic mattress  Question Answer Comment  Length of Need Lifetime   Patient has (list medical condition): Acute respiratory failure with hypoxia, Chronic heart failure with preserved ejection fraction   The above medical condition requires: Patient requires the ability to reposition frequently   Head must be elevated greater than: 30 degrees   Bed type Semi-electric      10/14/23 1526   10/14/23 1506  For home use only DME oxygen  Once       Question Answer Comment  Length of Need Lifetime   Mode or (Route) Nasal cannula   Liters per Minute 2   Frequency Continuous (stationary and portable oxygen unit needed)   Oxygen delivery system Gas      10/14/23 1506

## 2023-10-14 NOTE — TOC Progression Note (Addendum)
Transition of Care [facility]) - Progression Note    Patient Details  Name: Douglas Walker MRN: [id number] Date of Birth: 1925/06/10  [facility]) CM/SW Contact  Delilah Shan, LCSWA Phone Number: 10/14/2023, 9:31 AM  Clinical Narrative:     Patients insurance went to peer to peer for SNF [phone] option 5. Deadline is today 10/15 by 12:30pm. CSW informed MD. CSW will continue to follow.  Update-11:24am- MD informed CSW that he was informed that [employer]  is down and they cannot pull patient information so they cannot complete peer-to-peer. Insurance informed MD that they will extend the deadline.  Update-2:49pm- CSW spoke with patients insurance who informed CSW that MD can try to call back to complete peer to peer tele# [phone] option 5 deadline today by 4:00pm for peer to peer. CSW informed MD.  Update-2:57pm- MD informed CSW he completed peer to peer. Peer to peer was denied. .CSW informed patients son Douglas Walker. CSW informed CM. CM will follow up for home needs.  Expected Discharge Plan: Home w Home Health Services Barriers to Discharge: Continued Medical Work up  Expected Discharge Plan and Services In-house Referral: Clinical Social Work Discharge Planning Services: CM Consult Post Acute Care Choice: Home Health, Resumption of Svcs/PTA Provider Living arrangements for the past 2 months: Single Family Home                           HH Arranged: PT, RN, Nurse's Aide HH Agency: [facility] Date [facility] Agency Contacted: 10/10/23 Time HH Agency Contacted: 1059 Representative spoke with at [facility] Agency: Kandee Keen   Social Determinants of Health (SDOH) Interventions SDOH Screenings   Food Insecurity: No Food Insecurity (10/07/2023)  Housing: Low Risk  (10/07/2023)  Transportation Needs: No Transportation Needs (10/07/2023)  Utilities: Not At Risk (10/07/2023)  Depression (PHQ2-9): Low Risk  (07/29/2023)  Tobacco Use: Medium Risk (10/08/2023)     Readmission Risk Interventions    10/10/2023   10:25 AM 10/06/2023   12:21 PM 08/04/2023    9:31 AM  Readmission Risk Prevention Plan  Transportation Screening Complete Complete Complete  PCP or Specialist Appt within 3-5 Days   Complete  HRI or Home Care Consult   Complete  Social Work Consult for Recovery Care Planning/Counseling   Complete  Palliative Care Screening   Complete  Medication Review Oceanographer) Referral to Pharmacy Complete Complete  PCP or Specialist appointment within 3-5 days of discharge Complete Complete   HRI or Home Care Consult Complete Complete   SW Recovery Care/Counseling Consult Complete    Palliative Care Screening Not Applicable    Skilled Nursing Facility Complete Not Applicable

## 2023-10-14 NOTE — TOC Progression Note (Signed)
Transition of Care [facility]) - Progression Note    Patient Details  Name: Douglas Walker MRN: [id number] Date of Birth: 05/13/1925  [facility] CM/SW Contact  Graves-Bigelow, Lamar Laundry, RN Phone Number: 10/14/2023, 3:29 PM  Clinical Narrative:  Case Manager spoke with son and the plan will be for home with [facility] RN/PT/OT/Aide with [facility]. Referral submitted to [facility] and start of care to begin within 24-48 hours post transition home. DME oxygen and hospital bed ordered via [employer] for delivery. Patient will need portable tanks for travel home. Case Manager will continue to follow for additional transition of care needs.     Expected Discharge Plan: Home w Home Health Services Barriers to Discharge: No Barriers Identified  Expected Discharge Plan and Services In-house Referral: Clinical Social Work Discharge Planning Services: CM Consult Post Acute Care Choice: Home Health, Resumption of Svcs/PTA Provider Living arrangements for the past 2 months: Single Family Home                 DME Arranged: Hospital bed DME Agency: [employer] Date DME Agency Contacted: 10/14/23 Time DME Agency Contacted: 1528 Representative spoke with at DME Agency: Zack HH Arranged: PT, OT, Nurse's Aide, RN HH Agency: [facility] Date [facility] Agency Contacted: 10/14/23 Time HH Agency Contacted: 1529 Representative spoke with at [facility] Agency: Kandee Keen  Social Determinants of Health (SDOH) Interventions SDOH Screenings   Food Insecurity: No Food Insecurity (10/07/2023)  Housing: Low Risk  (10/07/2023)  Transportation Needs: No Transportation Needs (10/07/2023)  Utilities: Not At Risk (10/07/2023)  Depression (PHQ2-9): Low Risk  (07/29/2023)  Tobacco Use: Medium Risk (10/08/2023)    Readmission Risk Interventions    10/10/2023   10:25 AM 10/06/2023   12:21 PM 08/04/2023    9:31 AM  Readmission Risk Prevention Plan  Transportation Screening Complete Complete Complete  PCP or Specialist Appt  within 3-5 Days   Complete  HRI or Home Care Consult   Complete  Social Work Consult for Recovery Care Planning/Counseling   Complete  Palliative Care Screening   Complete  Medication Review Oceanographer) Referral to Pharmacy Complete Complete  PCP or Specialist appointment within 3-5 days of discharge Complete Complete   HRI or Home Care Consult Complete Complete   SW Recovery Care/Counseling Consult Complete    Palliative Care Screening Not Applicable    Skilled Nursing Facility Complete Not Applicable

## 2023-10-14 NOTE — Plan of Care (Signed)
No change in patient status, will be discharged tomorrow.

## 2023-10-14 NOTE — Progress Notes (Signed)
PROGRESS NOTE    Douglas HAZEL  Walker:[id number] DOB: [birth year] DOA: 10/07/2023 PCP: Georgann Housekeeper, MD   Brief Narrative:  89 y.o. male with medical history significant of hypertension, hyperlipidemia, paroxysmal atrial fibrillation on Eliquis, heart failure with preserved EF (50 to 55%, tachybradycardia syndrome s/p PPM, CKD stage III, pancytopenia, hypothyroidism, essential tremor, recurrent UTIs on Macrobid, BPH with chronic indwelling Foley catheter, and dementia admitted on 10/07/2023 with acute hypoxic respiratory failure secondary to sepsis due to pneumonia   Assessment & Plan:   Principal Problem:   Acute respiratory failure with hypoxia (HCC) Active Problems:   Sepsis due to pneumonia (HCC)   Chronic heart failure with preserved ejection fraction (HCC)   Chronic indwelling Foley catheter   Complicated urinary tract infection   Paroxysmal atrial fibrillation (HCC)   Chronic kidney disease, stage III (moderate) (HCC)   Dysphagia, post-stroke   Pancytopenia (HCC)   Hypothyroidism   Chronic pain   Dementia (HCC)  1) severe sepsis secondary to pneumonia and possible UTI--POA: Patient met criteria for severe sepsis based on fever 101.4, tachypnea and leukocytosis as well as acute hypoxic respiratory failure. Urine Legionella antigen negative -Received Zosyn and azithromycin and completed course, all cultures returned negative.  Leukocytosis improving and he is afebrile since admission.    2) acute hypoxic respiratory failure secondary to community-acquired pneumonia--initially required BiPAP.  Currently still requiring 2 L of oxygen with activity.  May need home oxygen.  He needs to use incentive spirometry more often.  Unfortunately, due to dementia, he needs frequent reminders.   3)??CAUTI--history of recurrent UTIs, with chronic indwelling Foley -Patient came here with Foley -Recent urine culture from 10/03/2023 with Morganella morganii and Proteus---both were sensitive to  ciprofloxacin and Zosyn respectively.  Patient received Zosyn and azithromycin during this hospitalization.   4)Paroxysmal atrial fibrillation on chronic anticoagulation Tachybradycardia syndrome s/p PPM -Patient is not on AV nodal blocking agents -Continue apixaban for stroke prophylaxis   5) chronic anemia/thrombocytopenia--indices appears close to recent baseline -No bleeding concerns continue to monitor   6) hypothyroidism--continue levothyroxine   7) dementia--- patient very hard of hearing, unsure if he is aware where he is at.  Hard to communicate.  Reorient, supportive care, delirium precautions   8) chronic pain syndrome--- continue Cymbalta, may use tramadol as needed  Acute on chronic congestive heart failure with midrange ejection fraction: Echo done just 5 days ago shows EF of 50 to 55%, chest x-ray shows pulmonary edema but BNP almost at his baseline.  I did not hear any crackles.  Continue home dose of Lasix.  CKD stage IIIb: At baseline, creatinine 1.5-1.7.  Generalized weakness: Evaluated by PT OT, SNF recommended, TOC working on placement.  Waiting for insurance authorization.  DVT prophylaxis: apixaban (ELIQUIS) tablet 2.5 mg Start: 10/07/23 2200   Code Status: Limited: Do not attempt resuscitation (DNR) -DNR-LIMITED -Do Not Intubate/DNI   Family Communication:  None present at bedside.    Status is: Inpatient Remains inpatient appropriate because: Patient has been medically stable for last 3 days, we are waiting for insurance authorization, I called for peer to peer around 11 AM as I was informed earlier by [employer] but according to the insurance company, their system is down and they cannot access patient information so P2P Cannot happen until the system is up again.  TOC informed.  Estimated body mass index is 25.84 kg/m as calculated from the following:   Height as of this encounter: 5\' 9"  (1.753 m).   Weight as  of this encounter: 79.4 kg.  Pressure Injury 07/30/23  Sacrum Stage 2 -  Partial thickness loss of dermis presenting as a shallow open injury with a red, pink wound bed without slough. (Active)  07/30/23 1156  Location: Sacrum  Location Orientation:   Staging: Stage 2 -  Partial thickness loss of dermis presenting as a shallow open injury with a red, pink wound bed without slough.  Wound Description (Comments):   Present on Admission: Yes  Dressing Type Foam - Lift dressing to assess site every shift 10/06/23 0958     Pressure Injury 07/30/23 Hip Right Unstageable - Full thickness tissue loss in which the base of the injury is covered by slough (yellow, tan, gray, green or brown) and/or eschar (tan, brown or black) in the wound bed. (Active)  07/30/23 1156  Location: Hip  Location Orientation: Right  Staging: Unstageable - Full thickness tissue loss in which the base of the injury is covered by slough (yellow, tan, gray, green or brown) and/or eschar (tan, brown or black) in the wound bed.  Wound Description (Comments):   Present on Admission: Yes  Dressing Type Foam - Lift dressing to assess site every shift 10/14/23 0835     Pressure Injury 10/03/23 Vertebral column Lower Deep Tissue Pressure Injury - Purple or maroon localized area of discolored intact skin or blood-filled blister due to damage of underlying soft tissue from pressure and/or shear. Area of purplish discolora (Active)  10/03/23 2059  Location: Vertebral column  Location Orientation: Lower  Staging: Deep Tissue Pressure Injury - Purple or maroon localized area of discolored intact skin or blood-filled blister due to damage of underlying soft tissue from pressure and/or shear.  Wound Description (Comments): Area of purplish discoloration above stage 2 on sacrum  Present on Admission: Yes  Dressing Type Foam - Lift dressing to assess site every shift 10/14/23 0835   Nutritional Assessment: Body mass index is 25.84 kg/m.Marland Kitchen Seen by dietician.  I agree with the assessment and  plan as outlined below: Nutrition Status:        . Skin Assessment: I have examined the patient's skin and I agree with the wound assessment as performed by the wound care RN as outlined below: Pressure Injury 07/30/23 Sacrum Stage 2 -  Partial thickness loss of dermis presenting as a shallow open injury with a red, pink wound bed without slough. (Active)  07/30/23 1156  Location: Sacrum  Location Orientation:   Staging: Stage 2 -  Partial thickness loss of dermis presenting as a shallow open injury with a red, pink wound bed without slough.  Wound Description (Comments):   Present on Admission: Yes  Dressing Type Foam - Lift dressing to assess site every shift 10/06/23 0958     Pressure Injury 07/30/23 Hip Right Unstageable - Full thickness tissue loss in which the base of the injury is covered by slough (yellow, tan, gray, green or brown) and/or eschar (tan, brown or black) in the wound bed. (Active)  07/30/23 1156  Location: Hip  Location Orientation: Right  Staging: Unstageable - Full thickness tissue loss in which the base of the injury is covered by slough (yellow, tan, gray, green or brown) and/or eschar (tan, brown or black) in the wound bed.  Wound Description (Comments):   Present on Admission: Yes  Dressing Type Foam - Lift dressing to assess site every shift 10/14/23 0835     Pressure Injury 10/03/23 Vertebral column Lower Deep Tissue Pressure Injury - Purple or maroon localized  area of discolored intact skin or blood-filled blister due to damage of underlying soft tissue from pressure and/or shear. Area of purplish discolora (Active)  10/03/23 2059  Location: Vertebral column  Location Orientation: Lower  Staging: Deep Tissue Pressure Injury - Purple or maroon localized area of discolored intact skin or blood-filled blister due to damage of underlying soft tissue from pressure and/or shear.  Wound Description (Comments): Area of purplish discoloration above stage 2 on  sacrum  Present on Admission: Yes  Dressing Type Foam - Lift dressing to assess site every shift 10/14/23 0835    Consultants:  None  Procedures:  None  Antimicrobials:  Anti-infectives (From admission, onward)    Start     Dose/Rate Route Frequency Ordered Stop   10/08/23 1000  cefTRIAXone (ROCEPHIN) 2 g in sodium chloride 0.9 % 100 mL IVPB  Status:  Discontinued        2 g 200 mL/hr over 30 Minutes Intravenous Every 24 hours 10/07/23 1045 10/07/23 1117   10/08/23 1000  azithromycin (ZITHROMAX) 500 mg in sodium chloride 0.9 % 250 mL IVPB        500 mg 250 mL/hr over 60 Minutes Intravenous Every 24 hours 10/07/23 1104 10/11/23 1031   10/07/23 2200  piperacillin-tazobactam (ZOSYN) IVPB 3.375 g        3.375 g 12.5 mL/hr over 240 Minutes Intravenous Every 8 hours 10/07/23 1121 10/12/23 0559   10/07/23 1130  piperacillin-tazobactam (ZOSYN) IVPB 3.375 g        3.375 g 100 mL/hr over 30 Minutes Intravenous Once 10/07/23 1118 10/07/23 1311   10/07/23 1100  cefTRIAXone (ROCEPHIN) 1 g in sodium chloride 0.9 % 100 mL IVPB  Status:  Discontinued       Note to Pharmacy: Should have been 2 g   1 g 200 mL/hr over 30 Minutes Intravenous  Once 10/07/23 1045 10/07/23 1117   10/07/23 0930  cefTRIAXone (ROCEPHIN) 1 g in sodium chloride 0.9 % 100 mL IVPB        1 g 200 mL/hr over 30 Minutes Intravenous  Once 10/07/23 0919 10/07/23 1136   10/07/23 0930  azithromycin (ZITHROMAX) 500 mg in sodium chloride 0.9 % 250 mL IVPB        500 mg 250 mL/hr over 60 Minutes Intravenous  Once 10/07/23 0919 10/07/23 1242         Subjective: Patient seen and examined.  He is alert and oriented to self and place today.  Better than what he has been in last few days.  He has no complaints and appears comfortable.  Objective: Vitals:   10/13/23 1947 10/14/23 0518 10/14/23 0711 10/14/23 0858  BP: (!) 146/66 120/70 129/69 118/61  Pulse: 69 67 63 71  Resp: 19 18 18 18   Temp: 98.1 F (36.7 C) (!) 97.4 F  (36.3 C) 97.9 F (36.6 C) 98.5 F (36.9 C)  TempSrc: Oral Oral Oral Oral  SpO2: 95% 91% 92% 94%  Weight:  79.4 kg    Height:        Intake/Output Summary (Last 24 hours) at 10/14/2023 1307 Last data filed at 10/14/2023 0835 Gross per 24 hour  Intake 600 ml  Output 2000 ml  Net -1400 ml   Filed Weights   10/12/23 0337 10/13/23 0302 10/14/23 0518  Weight: 79.4 kg 79.2 kg 79.4 kg    Examination:  General exam: Appears calm and comfortable  Respiratory system: Clear to auscultation. Respiratory effort normal. Cardiovascular system: S1 & S2 heard, RRR. No  JVD, murmurs, rubs, gallops or clicks. No pedal edema. Gastrointestinal system: Abdomen is nondistended, soft and nontender. No organomegaly or masses felt. Normal bowel sounds heard. Central nervous system: Alert and oriented X 2. No focal neurological deficits. Extremities: Symmetric 5 x 5 power. Skin: No rashes, lesions or ulcers.    Data Reviewed: I have personally reviewed following labs and imaging studies  CBC: Recent Labs  Lab 10/08/23 0431 10/10/23 0854 10/14/23 0821  WBC 13.0* 7.6 7.5  NEUTROABS  --  5.6 5.0  HGB 9.3* 8.6* 9.7*  HCT 28.8* 26.5* 29.9*  MCV 92.9 92.3 92.0  PLT 117* 112* 190   Basic Metabolic Panel: Recent Labs  Lab 10/08/23 0431 10/10/23 0854 10/14/23 0821  NA 138 139 137  K 3.6 3.7 3.6  CL 102 101 96*  CO2 26 27 32  GLUCOSE 100* 99 118*  BUN 25* 17 18  CREATININE 1.73* 1.56* 1.49*  CALCIUM 8.8* 9.0 9.3   GFR: Estimated Creatinine Clearance: 29 mL/min (A) (by C-G formula based on SCr of 1.49 mg/dL (H)). Liver Function Tests: No results for input(s): "AST", "ALT", "ALKPHOS", "BILITOT", "PROT", "ALBUMIN" in the last 168 hours.  No results for input(s): "LIPASE", "AMYLASE" in the last 168 hours. No results for input(s): "AMMONIA" in the last 168 hours. Coagulation Profile: No results for input(s): "INR", "PROTIME" in the last 168 hours.  Cardiac Enzymes: No results for  input(s): "CKTOTAL", "CKMB", "CKMBINDEX", "TROPONINI" in the last 168 hours. BNP (last 3 results) Recent Labs    02/04/23 1619 08/26/23 1526  PROBNP 1,402* 1,981*   HbA1C: No results for input(s): "HGBA1C" in the last 72 hours. CBG: Recent Labs  Lab 10/08/23 1623  GLUCAP 99   Lipid Profile: No results for input(s): "CHOL", "HDL", "LDLCALC", "TRIG", "CHOLHDL", "LDLDIRECT" in the last 72 hours. Thyroid Function Tests: No results for input(s): "TSH", "T4TOTAL", "FREET4", "T3FREE", "THYROIDAB" in the last 72 hours. Anemia Panel: No results for input(s): "VITAMINB12", "FOLATE", "FERRITIN", "TIBC", "IRON", "RETICCTPCT" in the last 72 hours. Sepsis Labs: No results for input(s): "PROCALCITON", "LATICACIDVEN" in the last 168 hours.   Recent Results (from the past 240 hour(s))  Resp panel by RT-PCR (RSV, Flu A&B, Covid) Anterior Nasal Swab     Status: None   Collection Time: 10/07/23  8:33 AM   Specimen: Anterior Nasal Swab  Result Value Ref Range Status   SARS Coronavirus 2 by RT PCR NEGATIVE NEGATIVE Final   Influenza A by PCR NEGATIVE NEGATIVE Final   Influenza B by PCR NEGATIVE NEGATIVE Final    Comment: (NOTE) The Xpert Xpress SARS-CoV-2/FLU/RSV plus assay is intended as an aid in the diagnosis of influenza from Nasopharyngeal swab specimens and should not be used as a sole basis for treatment. Nasal washings and aspirates are unacceptable for Xpert Xpress SARS-CoV-2/FLU/RSV testing.  Fact Sheet for Patients: BloggerCourse.com  Fact Sheet for Healthcare Providers: SeriousBroker.it  This test is not yet approved or cleared by the [place] FDA and has been authorized for detection and/or diagnosis of SARS-CoV-2 by FDA under an Emergency Use Authorization (EUA). This EUA will remain in effect (meaning this test can be used) for the duration of the COVID-19 declaration under Section 564(b)(1) of the Act, 21  U.S.C. section 360bbb-3(b)(1), unless the authorization is terminated or revoked.     Resp Syncytial Virus by PCR NEGATIVE NEGATIVE Final    Comment: (NOTE) Fact Sheet for Patients: BloggerCourse.com  Fact Sheet for Healthcare Providers: SeriousBroker.it  This test is not yet approved or  cleared by the [place] and has been authorized for detection and/or diagnosis of SARS-CoV-2 by FDA under an Emergency Use Authorization (EUA). This EUA will remain in effect (meaning this test can be used) for the duration of the COVID-19 declaration under Section 564(b)(1) of the Act, 21 U.S.C. section 360bbb-3(b)(1), unless the authorization is terminated or revoked.  Performed at [facility], [address], [place], [place] [address]   Blood culture (routine x 2)     Status: None   Collection Time: 10/07/23  9:18 AM   Specimen: BLOOD  Result Value Ref Range Status   Specimen Description BLOOD LEFT ANTECUBITAL  Final   Special Requests   Final    BOTTLES DRAWN AEROBIC ONLY Blood Culture adequate volume   Culture   Final    NO GROWTH 5 DAYS Performed at [facility], [address], [place], [place] [address]    Report Status 10/12/2023 FINAL  Final  Blood culture (routine x 2)     Status: None   Collection Time: 10/07/23  9:23 AM   Specimen: BLOOD  Result Value Ref Range Status   Specimen Description BLOOD RIGHT ANTECUBITAL  Final   Special Requests   Final    BOTTLES DRAWN AEROBIC AND ANAEROBIC Blood Culture adequate volume   Culture   Final    NO GROWTH 5 DAYS Performed at [facility], [address] [place], [place] [address]    Report Status 10/12/2023 FINAL  Final  Urine Culture     Status: Abnormal   Collection Time: 10/07/23  9:28 AM   Specimen: Urine, Catheterized  Result Value Ref Range Status   Specimen Description URINE, CATHETERIZED  Final   Special Requests NONE  Final   Culture (A)   Final    <10,000 COLONIES/mL INSIGNIFICANT GROWTH Performed at [facility], [address], [place], [place] [address]    Report Status 10/09/2023 FINAL  Final     Radiology Studies: No results found.  Scheduled Meds:  apixaban  2.5 mg Oral BID   Chlorhexidine Gluconate Cloth  6 each Topical Daily   cyanocobalamin  1,000 mcg Oral QODAY   DULoxetine  60 mg Oral Daily   furosemide  40 mg Oral Daily   levothyroxine  25 mcg Oral QAC breakfast   nystatin ointment  1 Application Topical BID   polyethylene glycol  17 g Oral Daily   pregabalin  50 mg Oral QHS   sodium chloride flush  3 mL Intravenous Q12H   traMADol  50 mg Oral BID   Continuous Infusions:     LOS: 7 days   Hughie Closs, MD [facility]  10/14/2023, 1:07 PM   *Please note that this is a verbal dictation therefore any spelling or grammatical errors are due to the "Dragon Medical One" system interpretation.  Please page via Amion and do not message via secure chat for urgent patient care matters. Secure chat can be used for non urgent patient care matters.  How to contact the [facility] Attending or Consulting provider 7A - 7P or covering provider during after hours 7P -7A, for this patient?  Check the care team in [facility] and look for a) attending/consulting [facility] provider listed and b) the [facility] team listed. Page or secure chat 7A-7P. Log into [email or web address] and use [place]'s universal password to access. If you do not have the password, please contact the hospital operator. Locate the [facility] provider you are looking  for under [facility] and page to a number that you can be directly reached. If you still have difficulty reaching the provider, please page the [facility] (Director on Call) for the Hospitalists listed on amion for assistance.

## 2023-10-14 NOTE — Progress Notes (Signed)
SATURATION QUALIFICATIONS: (This note is used to comply with regulatory documentation for home oxygen)  Patient Saturations on Room Air at Rest = 86%  Patient Saturations on Room Air while Ambulating = NT as desats on RA at rest  Patient Saturations on 2 Liters of oxygen while Ambulating = 90%  Please briefly explain why patient needs home oxygen:Pt desaturates on RA at rest. Will need home O2.  Shanita Kanan M,PT Acute Rehab Services [phone]

## 2023-10-15 DIAGNOSIS — J9601 Acute respiratory failure with hypoxia: Secondary | ICD-10-CM | POA: Diagnosis not present

## 2023-10-15 NOTE — Progress Notes (Signed)
Mobility Specialist Progress Note:   10/15/23 1400  Oxygen Therapy  O2 Device Nasal Cannula  O2 Flow Rate (L/min) 2 L/min  Mobility  Activity Ambulated with assistance in hallway  Level of Assistance Minimal assist, patient does 75% or more  Assistive Device Front wheel walker  Distance Ambulated (ft) 100 ft  Activity Response Tolerated well  Mobility Referral Yes  $Mobility charge 1 Mobility  Mobility Specialist Start Time (ACUTE ONLY) 1343  Mobility Specialist Stop Time (ACUTE ONLY) 1359  Mobility Specialist Time Calculation (min) (ACUTE ONLY) 16 min   Pt received in bed, agreeable to mobility. Required minA throughout session, d/t pt being very confused today. No complaints throughout ambulation. Pt left on [facility] with call bell and family present. NT aware.  D'Vante Earlene Plater Mobility Specialist Please contact via Special educational needs teacher or Rehab office at [phone]

## 2023-10-15 NOTE — Discharge Summary (Signed)
Sedation    Zocor [Simvastatin] Other (See Comments)    Arthralgias Fatigue    Contact information for follow-up providers     [facility], [facility] Follow up.   Specialty: Home Health Services Why: Registered Nurse, Physical and Occupational Therapy-office to call with visit times. Contact information: [address] [place] [address] [phone]         Georgann Housekeeper, MD Follow up in 1 week(s).   Specialty: Internal Medicine Contact information: [address] [place] [address] [phone]              Contact information for after-discharge care     Destination     [facility] Preferred SNF .   Service: Skilled Nursing Contact information: [address] [place] [address] [phone]                      The results of significant diagnostics from this hospitalization (including imaging, microbiology, ancillary and laboratory) are listed below for reference.    Significant Diagnostic Studies: DG CHEST PORT 1 VIEW  Result Date: 10/07/2023 CLINICAL DATA:  Shortness of breath. EXAM: PORTABLE CHEST 1 VIEW COMPARISON:  10/03/2023. FINDINGS: Redemonstration of diffuse increased interstitial markings with hilar and bibasilar predominance. There is probable slight interval worsening since the prior exam. There are bilateral small layering pleural effusions, left-greater-than-right. There is also right fissural effusion. No pneumothorax. Stable cardio-mediastinal  silhouette. There is [name] left sided 2-lead pacemaker. No acute osseous abnormalities. The soft tissues are within normal limits. IMPRESSION: *Slight interval worsening of congestive heart failure/pulmonary edema. Correlate clinically to exclude superimposed pneumonia. Electronically Signed   By: Jules Schick M.D.   On: 10/07/2023 08:35   ECHOCARDIOGRAM COMPLETE  Result Date: 10/04/2023    ECHOCARDIOGRAM REPORT   Patient Name:   Douglas Walker Date of Exam: 10/04/2023 Medical Rec #:  [id number]          Height:       69.0 in Accession #:    [id number]         Weight:       180.3 lb Date of Birth:  [birth year]          BSA:          1.977 m Patient Age:    89 years           BP:           122/75 mmHg Patient Gender: M                  HR:           70 bpm. Exam Location:  Inpatient Procedure: 2D Echo, Color Doppler and Cardiac Doppler Indications:    dyspnea  History:        Patient has prior history of Echocardiogram examinations, most                 recent 01/28/2021. Pacemaker, chronic kidney disease; Risk                 Factors:Hypertension.  Sonographer:    Delcie Roch RDCS Referring Phys: [id number] MAURICIO DANIEL ARRIEN IMPRESSIONS  1. Left ventricular ejection fraction, by estimation, is 50 to 55%. The left ventricle has low normal function. The left ventricle has no regional wall motion abnormalities. There is mild left ventricular hypertrophy. Left ventricular diastolic parameters are indeterminate.  2. Pacing wires in RA/RV .  Sedation    Zocor [Simvastatin] Other (See Comments)    Arthralgias Fatigue    Contact information for follow-up providers     [facility], [facility] Follow up.   Specialty: Home Health Services Why: Registered Nurse, Physical and Occupational Therapy-office to call with visit times. Contact information: [address] [place] [address] [phone]         Georgann Housekeeper, MD Follow up in 1 week(s).   Specialty: Internal Medicine Contact information: [address] [place] [address] [phone]              Contact information for after-discharge care     Destination     [facility] Preferred SNF .   Service: Skilled Nursing Contact information: [address] [place] [address] [phone]                      The results of significant diagnostics from this hospitalization (including imaging, microbiology, ancillary and laboratory) are listed below for reference.    Significant Diagnostic Studies: DG CHEST PORT 1 VIEW  Result Date: 10/07/2023 CLINICAL DATA:  Shortness of breath. EXAM: PORTABLE CHEST 1 VIEW COMPARISON:  10/03/2023. FINDINGS: Redemonstration of diffuse increased interstitial markings with hilar and bibasilar predominance. There is probable slight interval worsening since the prior exam. There are bilateral small layering pleural effusions, left-greater-than-right. There is also right fissural effusion. No pneumothorax. Stable cardio-mediastinal  silhouette. There is [name] left sided 2-lead pacemaker. No acute osseous abnormalities. The soft tissues are within normal limits. IMPRESSION: *Slight interval worsening of congestive heart failure/pulmonary edema. Correlate clinically to exclude superimposed pneumonia. Electronically Signed   By: Jules Schick M.D.   On: 10/07/2023 08:35   ECHOCARDIOGRAM COMPLETE  Result Date: 10/04/2023    ECHOCARDIOGRAM REPORT   Patient Name:   Douglas Walker Date of Exam: 10/04/2023 Medical Rec #:  [id number]          Height:       69.0 in Accession #:    [id number]         Weight:       180.3 lb Date of Birth:  [birth year]          BSA:          1.977 m Patient Age:    89 years           BP:           122/75 mmHg Patient Gender: M                  HR:           70 bpm. Exam Location:  Inpatient Procedure: 2D Echo, Color Doppler and Cardiac Doppler Indications:    dyspnea  History:        Patient has prior history of Echocardiogram examinations, most                 recent 01/28/2021. Pacemaker, chronic kidney disease; Risk                 Factors:Hypertension.  Sonographer:    Delcie Roch RDCS Referring Phys: [id number] MAURICIO DANIEL ARRIEN IMPRESSIONS  1. Left ventricular ejection fraction, by estimation, is 50 to 55%. The left ventricle has low normal function. The left ventricle has no regional wall motion abnormalities. There is mild left ventricular hypertrophy. Left ventricular diastolic parameters are indeterminate.  2. Pacing wires in RA/RV .  Physician Discharge Summary  Douglas Walker WUJ:[id number] DOB: May 11, 1925 DOA: 10/07/2023  PCP: Georgann Housekeeper, MD  Admit date: 10/07/2023 Discharge date: 10/15/2023  Time spent: 40 minutes  Recommendations for Outpatient Follow-up:  Follow outpatient CBC/CMP  Follow O2 needs outpatient, wean as tolerated outpatient (discharged on 2 L) Follow aortic dilatation outpatient  Follow indwelling foley catheter outpatient   Discharge Diagnoses:  Principal Problem:   Acute respiratory failure with hypoxia (HCC) Active Problems:   Sepsis due to pneumonia (HCC)   Chronic heart failure with preserved ejection fraction (HCC)   Chronic indwelling Foley catheter   Complicated urinary tract infection   Paroxysmal atrial fibrillation (HCC)   Chronic kidney disease, stage III (moderate) (HCC)   Dysphagia, post-stroke   Pancytopenia (HCC)   Hypothyroidism   Chronic pain   Dementia (HCC)   Discharge Condition: stable  Diet recommendation: heart healthy  Filed Weights   10/13/23 0302 10/14/23 0518 10/15/23 0346  Weight: 79.2 kg 79.4 kg 78.3 kg    History of present illness:   89 y.o. male with medical history significant of hypertension, hyperlipidemia, paroxysmal atrial fibrillation on Eliquis, heart failure with preserved EF (50 to 55%, tachybradycardia syndrome s/p PPM, CKD stage III, pancytopenia, hypothyroidism, essential tremor, recurrent UTIs on Macrobid, BPH with chronic indwelling Foley catheter, and dementia admitted on 10/07/2023 with acute hypoxic respiratory failure secondary to sepsis due to pneumonia.   He's improved after antibiotics.    See below for additional details  Hospital Course:  Assessment and Plan:  1) severe sepsis secondary to pneumonia and possible UTI--POA: Patient met criteria for severe sepsis based on fever 101.4, tachypnea and leukocytosis as well as acute hypoxic respiratory failure. Urine Legionella antigen negative -Received Zosyn and  azithromycin and completed course, all cultures returned negative.  Leukocytosis improving and he is afebrile since admission.    2) acute hypoxic respiratory failure secondary to community-acquired pneumonia--initially required BiPAP.  Currently still requiring 2 L  to maintain sats.  Will discharge on 2 L continuously.  Follow with outpatient provider regarding weaning further.    3)??CAUTI--history of recurrent UTIs, with chronic indwelling Foley -Patient came here with Foley -Recent urine culture from 10/03/2023 with Morganella morganii and Proteus---both were sensitive to ciprofloxacin and Zosyn respectively.  Patient received Zosyn and azithromycin during this hospitalization.   4)Paroxysmal atrial fibrillation on chronic anticoagulation Tachybradycardia syndrome s/p PPM -Patient is not on AV nodal blocking agents -Continue apixaban for stroke prophylaxis   5) chronic anemia/thrombocytopenia--indices appears close to recent baseline -No bleeding concerns continue to monitor   6) hypothyroidism--continue levothyroxine   7) dementia--- patient very hard of hearing, which complicates communication.  Delirium precautions.      8) chronic pain syndrome--- continue Cymbalta, may use tramadol as needed   Acute on chronic congestive heart failure with midrange ejection fraction: Echo done just 5 days ago shows EF of 50 to 55%, chest x-ray shows pulmonary edema but BNP almost at his baseline.  Continue home dose of Lasix.   CKD stage IIIb: At baseline, creatinine 1.5-1.7.   Generalized weakness: Evaluated by PT OT, SNF recommended, P2P was recommended by insurance company. Dr. Jacqulyn Bath did peer to peer, but they declined stating that patient is at his baseline functional status and he walked 100 feet.       Procedures: Echo IMPRESSIONS     1. Left ventricular ejection fraction, by estimation, is 50 to 55%. The  left ventricle has low normal function. The left ventricle has no  Physician Discharge Summary  Douglas Walker WUJ:[id number] DOB: May 11, 1925 DOA: 10/07/2023  PCP: Georgann Housekeeper, MD  Admit date: 10/07/2023 Discharge date: 10/15/2023  Time spent: 40 minutes  Recommendations for Outpatient Follow-up:  Follow outpatient CBC/CMP  Follow O2 needs outpatient, wean as tolerated outpatient (discharged on 2 L) Follow aortic dilatation outpatient  Follow indwelling foley catheter outpatient   Discharge Diagnoses:  Principal Problem:   Acute respiratory failure with hypoxia (HCC) Active Problems:   Sepsis due to pneumonia (HCC)   Chronic heart failure with preserved ejection fraction (HCC)   Chronic indwelling Foley catheter   Complicated urinary tract infection   Paroxysmal atrial fibrillation (HCC)   Chronic kidney disease, stage III (moderate) (HCC)   Dysphagia, post-stroke   Pancytopenia (HCC)   Hypothyroidism   Chronic pain   Dementia (HCC)   Discharge Condition: stable  Diet recommendation: heart healthy  Filed Weights   10/13/23 0302 10/14/23 0518 10/15/23 0346  Weight: 79.2 kg 79.4 kg 78.3 kg    History of present illness:   89 y.o. male with medical history significant of hypertension, hyperlipidemia, paroxysmal atrial fibrillation on Eliquis, heart failure with preserved EF (50 to 55%, tachybradycardia syndrome s/p PPM, CKD stage III, pancytopenia, hypothyroidism, essential tremor, recurrent UTIs on Macrobid, BPH with chronic indwelling Foley catheter, and dementia admitted on 10/07/2023 with acute hypoxic respiratory failure secondary to sepsis due to pneumonia.   He's improved after antibiotics.    See below for additional details  Hospital Course:  Assessment and Plan:  1) severe sepsis secondary to pneumonia and possible UTI--POA: Patient met criteria for severe sepsis based on fever 101.4, tachypnea and leukocytosis as well as acute hypoxic respiratory failure. Urine Legionella antigen negative -Received Zosyn and  azithromycin and completed course, all cultures returned negative.  Leukocytosis improving and he is afebrile since admission.    2) acute hypoxic respiratory failure secondary to community-acquired pneumonia--initially required BiPAP.  Currently still requiring 2 L  to maintain sats.  Will discharge on 2 L continuously.  Follow with outpatient provider regarding weaning further.    3)??CAUTI--history of recurrent UTIs, with chronic indwelling Foley -Patient came here with Foley -Recent urine culture from 10/03/2023 with Morganella morganii and Proteus---both were sensitive to ciprofloxacin and Zosyn respectively.  Patient received Zosyn and azithromycin during this hospitalization.   4)Paroxysmal atrial fibrillation on chronic anticoagulation Tachybradycardia syndrome s/p PPM -Patient is not on AV nodal blocking agents -Continue apixaban for stroke prophylaxis   5) chronic anemia/thrombocytopenia--indices appears close to recent baseline -No bleeding concerns continue to monitor   6) hypothyroidism--continue levothyroxine   7) dementia--- patient very hard of hearing, which complicates communication.  Delirium precautions.      8) chronic pain syndrome--- continue Cymbalta, may use tramadol as needed   Acute on chronic congestive heart failure with midrange ejection fraction: Echo done just 5 days ago shows EF of 50 to 55%, chest x-ray shows pulmonary edema but BNP almost at his baseline.  Continue home dose of Lasix.   CKD stage IIIb: At baseline, creatinine 1.5-1.7.   Generalized weakness: Evaluated by PT OT, SNF recommended, P2P was recommended by insurance company. Dr. Jacqulyn Bath did peer to peer, but they declined stating that patient is at his baseline functional status and he walked 100 feet.       Procedures: Echo IMPRESSIONS     1. Left ventricular ejection fraction, by estimation, is 50 to 55%. The  left ventricle has low normal function. The left ventricle has no  Physician Discharge Summary  Douglas Walker WUJ:[id number] DOB: May 11, 1925 DOA: 10/07/2023  PCP: Georgann Housekeeper, MD  Admit date: 10/07/2023 Discharge date: 10/15/2023  Time spent: 40 minutes  Recommendations for Outpatient Follow-up:  Follow outpatient CBC/CMP  Follow O2 needs outpatient, wean as tolerated outpatient (discharged on 2 L) Follow aortic dilatation outpatient  Follow indwelling foley catheter outpatient   Discharge Diagnoses:  Principal Problem:   Acute respiratory failure with hypoxia (HCC) Active Problems:   Sepsis due to pneumonia (HCC)   Chronic heart failure with preserved ejection fraction (HCC)   Chronic indwelling Foley catheter   Complicated urinary tract infection   Paroxysmal atrial fibrillation (HCC)   Chronic kidney disease, stage III (moderate) (HCC)   Dysphagia, post-stroke   Pancytopenia (HCC)   Hypothyroidism   Chronic pain   Dementia (HCC)   Discharge Condition: stable  Diet recommendation: heart healthy  Filed Weights   10/13/23 0302 10/14/23 0518 10/15/23 0346  Weight: 79.2 kg 79.4 kg 78.3 kg    History of present illness:   89 y.o. male with medical history significant of hypertension, hyperlipidemia, paroxysmal atrial fibrillation on Eliquis, heart failure with preserved EF (50 to 55%, tachybradycardia syndrome s/p PPM, CKD stage III, pancytopenia, hypothyroidism, essential tremor, recurrent UTIs on Macrobid, BPH with chronic indwelling Foley catheter, and dementia admitted on 10/07/2023 with acute hypoxic respiratory failure secondary to sepsis due to pneumonia.   He's improved after antibiotics.    See below for additional details  Hospital Course:  Assessment and Plan:  1) severe sepsis secondary to pneumonia and possible UTI--POA: Patient met criteria for severe sepsis based on fever 101.4, tachypnea and leukocytosis as well as acute hypoxic respiratory failure. Urine Legionella antigen negative -Received Zosyn and  azithromycin and completed course, all cultures returned negative.  Leukocytosis improving and he is afebrile since admission.    2) acute hypoxic respiratory failure secondary to community-acquired pneumonia--initially required BiPAP.  Currently still requiring 2 L  to maintain sats.  Will discharge on 2 L continuously.  Follow with outpatient provider regarding weaning further.    3)??CAUTI--history of recurrent UTIs, with chronic indwelling Foley -Patient came here with Foley -Recent urine culture from 10/03/2023 with Morganella morganii and Proteus---both were sensitive to ciprofloxacin and Zosyn respectively.  Patient received Zosyn and azithromycin during this hospitalization.   4)Paroxysmal atrial fibrillation on chronic anticoagulation Tachybradycardia syndrome s/p PPM -Patient is not on AV nodal blocking agents -Continue apixaban for stroke prophylaxis   5) chronic anemia/thrombocytopenia--indices appears close to recent baseline -No bleeding concerns continue to monitor   6) hypothyroidism--continue levothyroxine   7) dementia--- patient very hard of hearing, which complicates communication.  Delirium precautions.      8) chronic pain syndrome--- continue Cymbalta, may use tramadol as needed   Acute on chronic congestive heart failure with midrange ejection fraction: Echo done just 5 days ago shows EF of 50 to 55%, chest x-ray shows pulmonary edema but BNP almost at his baseline.  Continue home dose of Lasix.   CKD stage IIIb: At baseline, creatinine 1.5-1.7.   Generalized weakness: Evaluated by PT OT, SNF recommended, P2P was recommended by insurance company. Dr. Jacqulyn Bath did peer to peer, but they declined stating that patient is at his baseline functional status and he walked 100 feet.       Procedures: Echo IMPRESSIONS     1. Left ventricular ejection fraction, by estimation, is 50 to 55%. The  left ventricle has low normal function. The left ventricle has no  Sedation    Zocor [Simvastatin] Other (See Comments)    Arthralgias Fatigue    Contact information for follow-up providers     [facility] Follow up.   Specialty: Home Health Services Why: Registered Nurse, Physical and Occupational Therapy-office to call with visit times. Contact information: [address] [place] [address] [phone]         Georgann Housekeeper, MD Follow up in 1 week(s).   Specialty: Internal Medicine Contact information: [address] [place] [address] [phone]              Contact information for after-discharge care     Destination     [facility] Preferred SNF .   Service: Skilled Nursing Contact information: [address] [place] [address] [phone]                      The results of significant diagnostics from this hospitalization (including imaging, microbiology, ancillary and laboratory) are listed below for reference.    Significant Diagnostic Studies: DG CHEST PORT 1 VIEW  Result Date: 10/07/2023 CLINICAL DATA:  Shortness of breath. EXAM: PORTABLE CHEST 1 VIEW COMPARISON:  10/03/2023. FINDINGS: Redemonstration of diffuse increased interstitial markings with hilar and bibasilar predominance. There is probable slight interval worsening since the prior exam. There are bilateral small layering pleural effusions, left-greater-than-right. There is also right fissural effusion. No pneumothorax. Stable cardio-mediastinal  silhouette. There is [name] left sided 2-lead pacemaker. No acute osseous abnormalities. The soft tissues are within normal limits. IMPRESSION: *Slight interval worsening of congestive heart failure/pulmonary edema. Correlate clinically to exclude superimposed pneumonia. Electronically Signed   By: Jules Schick M.D.   On: 10/07/2023 08:35   ECHOCARDIOGRAM COMPLETE  Result Date: 10/04/2023    ECHOCARDIOGRAM REPORT   Patient Name:   Douglas Walker Date of Exam: 10/04/2023 Medical Rec #:  [id number]          Height:       69.0 in Accession #:    [id number]         Weight:       180.3 lb Date of Birth:  [birth year]          BSA:          1.977 m Patient Age:    89 years           BP:           122/75 mmHg Patient Gender: M                  HR:           70 bpm. Exam Location:  Inpatient Procedure: 2D Echo, Color Doppler and Cardiac Doppler Indications:    dyspnea  History:        Patient has prior history of Echocardiogram examinations, most                 recent 01/28/2021. Pacemaker, chronic kidney disease; Risk                 Factors:Hypertension.  Sonographer:    Delcie Roch RDCS Referring Phys: [id number] MAURICIO DANIEL ARRIEN IMPRESSIONS  1. Left ventricular ejection fraction, by estimation, is 50 to 55%. The left ventricle has low normal function. The left ventricle has no regional wall motion abnormalities. There is mild left ventricular hypertrophy. Left ventricular diastolic parameters are indeterminate.  2. Pacing wires in RA/RV .  Physician Discharge Summary  Douglas Walker WUJ:[id number] DOB: May 11, 1925 DOA: 10/07/2023  PCP: Georgann Housekeeper, MD  Admit date: 10/07/2023 Discharge date: 10/15/2023  Time spent: 40 minutes  Recommendations for Outpatient Follow-up:  Follow outpatient CBC/CMP  Follow O2 needs outpatient, wean as tolerated outpatient (discharged on 2 L) Follow aortic dilatation outpatient  Follow indwelling foley catheter outpatient   Discharge Diagnoses:  Principal Problem:   Acute respiratory failure with hypoxia (HCC) Active Problems:   Sepsis due to pneumonia (HCC)   Chronic heart failure with preserved ejection fraction (HCC)   Chronic indwelling Foley catheter   Complicated urinary tract infection   Paroxysmal atrial fibrillation (HCC)   Chronic kidney disease, stage III (moderate) (HCC)   Dysphagia, post-stroke   Pancytopenia (HCC)   Hypothyroidism   Chronic pain   Dementia (HCC)   Discharge Condition: stable  Diet recommendation: heart healthy  Filed Weights   10/13/23 0302 10/14/23 0518 10/15/23 0346  Weight: 79.2 kg 79.4 kg 78.3 kg    History of present illness:   89 y.o. male with medical history significant of hypertension, hyperlipidemia, paroxysmal atrial fibrillation on Eliquis, heart failure with preserved EF (50 to 55%, tachybradycardia syndrome s/p PPM, CKD stage III, pancytopenia, hypothyroidism, essential tremor, recurrent UTIs on Macrobid, BPH with chronic indwelling Foley catheter, and dementia admitted on 10/07/2023 with acute hypoxic respiratory failure secondary to sepsis due to pneumonia.   He's improved after antibiotics.    See below for additional details  Hospital Course:  Assessment and Plan:  1) severe sepsis secondary to pneumonia and possible UTI--POA: Patient met criteria for severe sepsis based on fever 101.4, tachypnea and leukocytosis as well as acute hypoxic respiratory failure. Urine Legionella antigen negative -Received Zosyn and  azithromycin and completed course, all cultures returned negative.  Leukocytosis improving and he is afebrile since admission.    2) acute hypoxic respiratory failure secondary to community-acquired pneumonia--initially required BiPAP.  Currently still requiring 2 L  to maintain sats.  Will discharge on 2 L continuously.  Follow with outpatient provider regarding weaning further.    3)??CAUTI--history of recurrent UTIs, with chronic indwelling Foley -Patient came here with Foley -Recent urine culture from 10/03/2023 with Morganella morganii and Proteus---both were sensitive to ciprofloxacin and Zosyn respectively.  Patient received Zosyn and azithromycin during this hospitalization.   4)Paroxysmal atrial fibrillation on chronic anticoagulation Tachybradycardia syndrome s/p PPM -Patient is not on AV nodal blocking agents -Continue apixaban for stroke prophylaxis   5) chronic anemia/thrombocytopenia--indices appears close to recent baseline -No bleeding concerns continue to monitor   6) hypothyroidism--continue levothyroxine   7) dementia--- patient very hard of hearing, which complicates communication.  Delirium precautions.      8) chronic pain syndrome--- continue Cymbalta, may use tramadol as needed   Acute on chronic congestive heart failure with midrange ejection fraction: Echo done just 5 days ago shows EF of 50 to 55%, chest x-ray shows pulmonary edema but BNP almost at his baseline.  Continue home dose of Lasix.   CKD stage IIIb: At baseline, creatinine 1.5-1.7.   Generalized weakness: Evaluated by PT OT, SNF recommended, P2P was recommended by insurance company. Dr. Jacqulyn Bath did peer to peer, but they declined stating that patient is at his baseline functional status and he walked 100 feet.       Procedures: Echo IMPRESSIONS     1. Left ventricular ejection fraction, by estimation, is 50 to 55%. The  left ventricle has low normal function. The left ventricle has no

## 2023-10-15 NOTE — TOC Progression Note (Signed)
Transition of Care [facility]) - Progression Note    Patient Details  Name: EUSTACIO ELLEN MRN: [id number] Date of Birth: [birth year]  Transition of Care [facility]) CM/SW Contact  Delilah Shan, LCSWA Phone Number: 10/15/2023, 1:07 PM  Clinical Narrative:     CSW received a call from patients insurance confirming peer to peer denial for SNF. MD,CM, and patients son Brett Canales already informed. TOC will continue to follow.  Expected Discharge Plan: Home w Home Health Services Barriers to Discharge: No Barriers Identified  Expected Discharge Plan and Services In-house Referral: Clinical Social Work Discharge Planning Services: CM Consult Post Acute Care Choice: Home Health, Resumption of Svcs/PTA Provider Living arrangements for the past 2 months: Single Family Home                 DME Arranged: Hospital bed DME Agency: [employer] Date DME Agency Contacted: 10/14/23 Time DME Agency Contacted: 1528 Representative spoke with at DME Agency: Zack HH Arranged: PT, OT, Nurse's Aide, RN HH Agency: [facility] Date [facility] Agency Contacted: 10/14/23 Time HH Agency Contacted: 1529 Representative spoke with at [facility] Agency: Kandee Keen   Social Determinants of Health (SDOH) Interventions SDOH Screenings   Food Insecurity: No Food Insecurity (10/07/2023)  Housing: Low Risk  (10/07/2023)  Transportation Needs: No Transportation Needs (10/07/2023)  Utilities: Not At Risk (10/07/2023)  Depression (PHQ2-9): Low Risk  (07/29/2023)  Tobacco Use: Medium Risk (10/08/2023)    Readmission Risk Interventions    10/10/2023   10:25 AM 10/06/2023   12:21 PM 08/04/2023    9:31 AM  Readmission Risk Prevention Plan  Transportation Screening Complete Complete Complete  PCP or Specialist Appt within 3-5 Days   Complete  HRI or Home Care Consult   Complete  Social Work Consult for Recovery Care Planning/Counseling   Complete  Palliative Care Screening   Complete  Medication Review Oceanographer) Referral to  Pharmacy Complete Complete  PCP or Specialist appointment within 3-5 days of discharge Complete Complete   HRI or Home Care Consult Complete Complete   SW Recovery Care/Counseling Consult Complete    Palliative Care Screening Not Applicable    Skilled Nursing Facility Complete Not Applicable

## 2023-10-15 NOTE — Progress Notes (Signed)
Mobility Specialist Progress Note:   10/15/23 1413  Mobility  Activity Transferred to/from [facility]  Level of Assistance Minimal assist, patient does 75% or more  Assistive Device Front wheel walker  Distance Ambulated (ft) 4 ft  Activity Response Tolerated well  Mobility Referral Yes  $Mobility charge 1 Mobility  Mobility Specialist Start Time (ACUTE ONLY) 1405  Mobility Specialist Stop Time (ACUTE ONLY) 1416  Mobility Specialist Time Calculation (min) (ACUTE ONLY) 11 min    Pt received on BSC, void unsuccessful. Assisted in pericare. Asymptomatic w/ no complaints. Pt left in bed with call bell and all needs met. Family present and bed alarm on.  D'Vante Earlene Plater Mobility Specialist Please contact via Special educational needs teacher or Rehab office at [phone]

## 2023-10-15 NOTE — TOC Transition Note (Signed)
Transition of Care [facility]) - CM/SW Discharge Note   Patient Details  Name: Douglas Walker MRN: [id number] Date of Birth: 11-Aug-1925  Transition of Care [facility]) CM/SW Contact:  Gala Lewandowsky, RN Phone Number: 10/15/2023, 1:17 PM   Clinical Narrative:  Patient will transition home today. Case Manager spoke with son and oxygen was delivered to the home. Patient has portable tanks for travel home via private vehicle. Son states he will arrive between 2:00-2:30 pm for transport home. No further needs identified at this time.  Barriers to Discharge: No Barriers Identified   Discharge Plan and Services Additional resources added to the After Visit Summary for   In-house Referral: Clinical Social Work Discharge Planning Services: CM Consult Post Acute Care Choice: Home Health, Resumption of Svcs/PTA Provider          DME Arranged: Hospital bed DME Agency: [employer] Date DME Agency Contacted: 10/14/23 Time DME Agency Contacted: 1528 Representative spoke with at DME Agency: Zack HH Arranged: PT, OT, Nurse's Aide, RN HH Agency: [facility] Date [facility] Agency Contacted: 10/14/23 Time HH Agency Contacted: 1529 Representative spoke with at [facility] Agency: Kandee Keen  Social Determinants of Health (SDOH) Interventions SDOH Screenings   Food Insecurity: No Food Insecurity (10/07/2023)  Housing: Low Risk  (10/07/2023)  Transportation Needs: No Transportation Needs (10/07/2023)  Utilities: Not At Risk (10/07/2023)  Depression (PHQ2-9): Low Risk  (07/29/2023)  Tobacco Use: Medium Risk (10/08/2023)    Readmission Risk Interventions    10/10/2023   10:25 AM 10/06/2023   12:21 PM 08/04/2023    9:31 AM  Readmission Risk Prevention Plan  Transportation Screening Complete Complete Complete  PCP or Specialist Appt within 3-5 Days   Complete  HRI or Home Care Consult   Complete  Social Work Consult for Recovery Care Planning/Counseling   Complete  Palliative Care Screening   Complete   Medication Review Oceanographer) Referral to Pharmacy Complete Complete  PCP or Specialist appointment within 3-5 days of discharge Complete Complete   HRI or Home Care Consult Complete Complete   SW Recovery Care/Counseling Consult Complete    Palliative Care Screening Not Applicable    Skilled Nursing Facility Complete Not Applicable

## 2023-10-16 ENCOUNTER — Other Ambulatory Visit: Payer: Self-pay

## 2023-10-16 ENCOUNTER — Encounter (HOSPITAL_COMMUNITY): Payer: Self-pay | Admitting: Internal Medicine

## 2023-10-16 ENCOUNTER — Inpatient Hospital Stay (HOSPITAL_COMMUNITY)
Admission: EM | Admit: 2023-10-16 | Discharge: 2023-10-19 | DRG: 871 | Disposition: A | Discharge status: Hospice | Payer: Medicare Other | Attending: Internal Medicine | Admitting: Internal Medicine

## 2023-10-16 ENCOUNTER — Emergency Department (HOSPITAL_COMMUNITY): Payer: Medicare Other

## 2023-10-16 DIAGNOSIS — Z1152 Encounter for screening for COVID-19: Secondary | ICD-10-CM | POA: Diagnosis not present

## 2023-10-16 DIAGNOSIS — H9190 Unspecified hearing loss, unspecified ear: Secondary | ICD-10-CM | POA: Diagnosis present

## 2023-10-16 DIAGNOSIS — N1832 Chronic kidney disease, stage 3b: Secondary | ICD-10-CM | POA: Diagnosis present

## 2023-10-16 DIAGNOSIS — D696 Thrombocytopenia, unspecified: Secondary | ICD-10-CM | POA: Diagnosis present

## 2023-10-16 DIAGNOSIS — J180 Bronchopneumonia, unspecified organism: Secondary | ICD-10-CM | POA: Diagnosis present

## 2023-10-16 DIAGNOSIS — J189 Pneumonia, unspecified organism: Secondary | ICD-10-CM | POA: Diagnosis present

## 2023-10-16 DIAGNOSIS — Z79899 Other long term (current) drug therapy: Secondary | ICD-10-CM

## 2023-10-16 DIAGNOSIS — I480 Paroxysmal atrial fibrillation: Secondary | ICD-10-CM | POA: Diagnosis present

## 2023-10-16 DIAGNOSIS — Z888 Allergy status to other drugs, medicaments and biological substances status: Secondary | ICD-10-CM

## 2023-10-16 DIAGNOSIS — Z821 Family history of blindness and visual loss: Secondary | ICD-10-CM

## 2023-10-16 DIAGNOSIS — Z66 Do not resuscitate: Secondary | ICD-10-CM | POA: Diagnosis present

## 2023-10-16 DIAGNOSIS — I495 Sick sinus syndrome: Secondary | ICD-10-CM | POA: Diagnosis present

## 2023-10-16 DIAGNOSIS — Z79890 Hormone replacement therapy (postmenopausal): Secondary | ICD-10-CM | POA: Diagnosis not present

## 2023-10-16 DIAGNOSIS — D631 Anemia in chronic kidney disease: Secondary | ICD-10-CM | POA: Diagnosis present

## 2023-10-16 DIAGNOSIS — T83511D Infection and inflammatory reaction due to indwelling urethral catheter, subsequent encounter: Secondary | ICD-10-CM

## 2023-10-16 DIAGNOSIS — T83518A Infection and inflammatory reaction due to other urinary catheter, initial encounter: Secondary | ICD-10-CM | POA: Diagnosis present

## 2023-10-16 DIAGNOSIS — I69391 Dysphagia following cerebral infarction: Secondary | ICD-10-CM

## 2023-10-16 DIAGNOSIS — N400 Enlarged prostate without lower urinary tract symptoms: Secondary | ICD-10-CM | POA: Diagnosis present

## 2023-10-16 DIAGNOSIS — Z85828 Personal history of other malignant neoplasm of skin: Secondary | ICD-10-CM

## 2023-10-16 DIAGNOSIS — R6520 Severe sepsis without septic shock: Secondary | ICD-10-CM | POA: Diagnosis present

## 2023-10-16 DIAGNOSIS — I5022 Chronic systolic (congestive) heart failure: Secondary | ICD-10-CM | POA: Diagnosis not present

## 2023-10-16 DIAGNOSIS — Z515 Encounter for palliative care: Secondary | ICD-10-CM

## 2023-10-16 DIAGNOSIS — Y846 Urinary catheterization as the cause of abnormal reaction of the patient, or of later complication, without mention of misadventure at the time of the procedure: Secondary | ICD-10-CM | POA: Diagnosis present

## 2023-10-16 DIAGNOSIS — I5043 Acute on chronic combined systolic (congestive) and diastolic (congestive) heart failure: Secondary | ICD-10-CM | POA: Diagnosis present

## 2023-10-16 DIAGNOSIS — I130 Hypertensive heart and chronic kidney disease with heart failure and stage 1 through stage 4 chronic kidney disease, or unspecified chronic kidney disease: Secondary | ICD-10-CM | POA: Diagnosis present

## 2023-10-16 DIAGNOSIS — Z881 Allergy status to other antibiotic agents status: Secondary | ICD-10-CM

## 2023-10-16 DIAGNOSIS — N183 Chronic kidney disease, stage 3 unspecified: Secondary | ICD-10-CM | POA: Diagnosis present

## 2023-10-16 DIAGNOSIS — J690 Pneumonitis due to inhalation of food and vomit: Secondary | ICD-10-CM | POA: Diagnosis present

## 2023-10-16 DIAGNOSIS — E039 Hypothyroidism, unspecified: Secondary | ICD-10-CM | POA: Diagnosis present

## 2023-10-16 DIAGNOSIS — Z823 Family history of stroke: Secondary | ICD-10-CM

## 2023-10-16 DIAGNOSIS — Z950 Presence of cardiac pacemaker: Secondary | ICD-10-CM | POA: Diagnosis not present

## 2023-10-16 DIAGNOSIS — D649 Anemia, unspecified: Secondary | ICD-10-CM | POA: Diagnosis present

## 2023-10-16 DIAGNOSIS — J9601 Acute respiratory failure with hypoxia: Secondary | ICD-10-CM | POA: Diagnosis present

## 2023-10-16 DIAGNOSIS — Z87440 Personal history of urinary (tract) infections: Secondary | ICD-10-CM

## 2023-10-16 DIAGNOSIS — F03B0 Unspecified dementia, moderate, without behavioral disturbance, psychotic disturbance, mood disturbance, and anxiety: Secondary | ICD-10-CM | POA: Diagnosis not present

## 2023-10-16 DIAGNOSIS — Z8379 Family history of other diseases of the digestive system: Secondary | ICD-10-CM

## 2023-10-16 DIAGNOSIS — N390 Urinary tract infection, site not specified: Secondary | ICD-10-CM | POA: Diagnosis present

## 2023-10-16 DIAGNOSIS — Z7901 Long term (current) use of anticoagulants: Secondary | ICD-10-CM

## 2023-10-16 DIAGNOSIS — F0390 Unspecified dementia without behavioral disturbance: Secondary | ICD-10-CM | POA: Diagnosis present

## 2023-10-16 DIAGNOSIS — E785 Hyperlipidemia, unspecified: Secondary | ICD-10-CM | POA: Diagnosis present

## 2023-10-16 DIAGNOSIS — A419 Sepsis, unspecified organism: Principal | ICD-10-CM | POA: Diagnosis present

## 2023-10-16 DIAGNOSIS — Z8249 Family history of ischemic heart disease and other diseases of the circulatory system: Secondary | ICD-10-CM

## 2023-10-16 DIAGNOSIS — R531 Weakness: Secondary | ICD-10-CM

## 2023-10-16 DIAGNOSIS — Z822 Family history of deafness and hearing loss: Secondary | ICD-10-CM

## 2023-10-16 DIAGNOSIS — Z87891 Personal history of nicotine dependence: Secondary | ICD-10-CM

## 2023-10-16 LAB — I-STAT ARTERIAL BLOOD GAS, ED
Acid-Base Excess: 3 mmol/L — ABNORMAL HIGH (ref 0.0–2.0)
Bicarbonate: 28.1 mmol/L — ABNORMAL HIGH (ref 20.0–28.0)
Calcium, Ion: 1.28 mmol/L (ref 1.15–1.40)
HCT: 34 % — ABNORMAL LOW (ref 39.0–52.0)
Hemoglobin: 11.6 g/dL — ABNORMAL LOW (ref 13.0–17.0)
O2 Saturation: 94 %
Patient temperature: 36.9
Potassium: 3.3 mmol/L — ABNORMAL LOW (ref 3.5–5.1)
Sodium: 138 mmol/L (ref 135–145)
TCO2: 29 mmol/L (ref 22–32)
pCO2 arterial: 46.2 mm[Hg] (ref 32–48)
pH, Arterial: 7.391 (ref 7.35–7.45)
pO2, Arterial: 71 mm[Hg] — ABNORMAL LOW (ref 83–108)

## 2023-10-16 LAB — COMPREHENSIVE METABOLIC PANEL
ALT: 16 U/L (ref 0–44)
AST: 23 U/L (ref 15–41)
Albumin: 2.9 g/dL — ABNORMAL LOW (ref 3.5–5.0)
Alkaline Phosphatase: 96 U/L (ref 38–126)
Anion gap: 17 — ABNORMAL HIGH (ref 5–15)
BUN: 25 mg/dL — ABNORMAL HIGH (ref 8–23)
CO2: 26 mmol/L (ref 22–32)
Calcium: 10.4 mg/dL — ABNORMAL HIGH (ref 8.9–10.3)
Chloride: 95 mmol/L — ABNORMAL LOW (ref 98–111)
Creatinine, Ser: 1.57 mg/dL — ABNORMAL HIGH (ref 0.61–1.24)
GFR, Estimated: 40 mL/min — ABNORMAL LOW (ref >60)
Glucose, Bld: 111 mg/dL — ABNORMAL HIGH (ref 70–99)
Potassium: 3.7 mmol/L (ref 3.5–5.1)
Sodium: 138 mmol/L (ref 135–145)
Total Bilirubin: 0.7 mg/dL (ref 0.3–1.2)
Total Protein: 8.5 g/dL — ABNORMAL HIGH (ref 6.5–8.1)

## 2023-10-16 LAB — CBC WITH DIFFERENTIAL/PLATELET
Abs Immature Granulocytes: 0.15 10*3/uL — ABNORMAL HIGH (ref 0.00–0.07)
Basophils Absolute: 0.1 10*3/uL (ref 0.0–0.1)
Basophils Relative: 0 %
Eosinophils Absolute: 0 10*3/uL (ref 0.0–0.5)
Eosinophils Relative: 0 %
HCT: 33.5 % — ABNORMAL LOW (ref 39.0–52.0)
Hemoglobin: 10.9 g/dL — ABNORMAL LOW (ref 13.0–17.0)
Immature Granulocytes: 1 %
Lymphocytes Relative: 6 %
Lymphs Abs: 1 10*3/uL (ref 0.7–4.0)
MCH: 29.8 pg (ref 26.0–34.0)
MCHC: 32.5 g/dL (ref 30.0–36.0)
MCV: 91.5 fL (ref 80.0–100.0)
Monocytes Absolute: 0.7 10*3/uL (ref 0.1–1.0)
Monocytes Relative: 5 %
Neutro Abs: 13.6 10*3/uL — ABNORMAL HIGH (ref 1.7–7.7)
Neutrophils Relative %: 88 %
Platelets: 227 10*3/uL (ref 150–400)
RBC: 3.66 MIL/uL — ABNORMAL LOW (ref 4.22–5.81)
RDW: 15.9 % — ABNORMAL HIGH (ref 11.5–15.5)
WBC: 15.5 10*3/uL — ABNORMAL HIGH (ref 4.0–10.5)
nRBC: 0 % (ref 0.0–0.2)

## 2023-10-16 LAB — RESP PANEL BY RT-PCR (RSV, FLU A&B, COVID)  RVPGX2
Influenza A by PCR: NEGATIVE
Influenza B by PCR: NEGATIVE
Resp Syncytial Virus by PCR: NEGATIVE
SARS Coronavirus 2 by RT PCR: NEGATIVE

## 2023-10-16 LAB — I-STAT CG4 LACTIC ACID, ED
Lactic Acid, Venous: 2.2 mmol/L (ref 0.5–1.9)
Lactic Acid, Venous: 1.3 mmol/L (ref 0.5–1.9)

## 2023-10-16 LAB — PROTIME-INR
INR: 1.2 (ref 0.8–1.2)
Prothrombin Time: 15.8 s — ABNORMAL HIGH (ref 11.4–15.2)

## 2023-10-16 LAB — URINALYSIS, W/ REFLEX TO CULTURE (INFECTION SUSPECTED)
Bilirubin Urine: NEGATIVE
Glucose, UA: NEGATIVE mg/dL
Ketones, ur: 5 mg/dL — AB
Nitrite: NEGATIVE
Protein, ur: 30 mg/dL — AB
RBC / HPF: >50 RBC/hpf (ref 0–5)
Specific Gravity, Urine: 1.015 (ref 1.005–1.030)
WBC, UA: >50 WBC/hpf (ref 0–5)
pH: 5 (ref 5.0–8.0)

## 2023-10-16 LAB — APTT: aPTT: 30 s (ref 24–36)

## 2023-10-16 LAB — BRAIN NATRIURETIC PEPTIDE: B Natriuretic Peptide: 475.1 pg/mL — ABNORMAL HIGH (ref 0.0–100.0)

## 2023-10-16 LAB — PROCALCITONIN: Procalcitonin: 6.18 ng/mL

## 2023-10-16 MED ORDER — PIPERACILLIN-TAZOBACTAM 3.375 G IVPB
3.3750 g | Freq: Three times a day (TID) | INTRAVENOUS | Status: DC
  Administered 2023-10-16 – 2023-10-18 (×6): 3.375 g via INTRAVENOUS
  Filled 2023-10-16 (×6): qty 50

## 2023-10-16 MED ORDER — SODIUM CHLORIDE 0.9% FLUSH
3.0000 mL | Freq: Two times a day (BID) | INTRAVENOUS | Status: DC
  Administered 2023-10-16 – 2023-10-19 (×5): 3 mL via INTRAVENOUS

## 2023-10-16 MED ORDER — PIPERACILLIN-TAZOBACTAM 3.375 G IVPB 30 MIN
3.3750 g | Freq: Once | INTRAVENOUS | Status: AC
  Administered 2023-10-16: 3.375 g via INTRAVENOUS
  Filled 2023-10-16: qty 50

## 2023-10-16 MED ORDER — ALBUTEROL SULFATE (2.5 MG/3ML) 0.083% IN NEBU: 2.5000 mg | INHALATION_SOLUTION | RESPIRATORY_TRACT | Status: DC | PRN

## 2023-10-16 MED ORDER — VANCOMYCIN HCL 1500 MG/300ML IV SOLN
1500.0000 mg | Freq: Once | INTRAVENOUS | Status: AC
  Administered 2023-10-16: 1500 mg via INTRAVENOUS
  Filled 2023-10-16: qty 300

## 2023-10-16 MED ORDER — ACETAMINOPHEN 650 MG RE SUPP
650.0000 mg | Freq: Once | RECTAL | Status: AC
  Administered 2023-10-16: 650 mg via RECTAL
  Filled 2023-10-16: qty 1

## 2023-10-16 MED ORDER — GUAIFENESIN ER 600 MG PO TB12
600.0000 mg | ORAL_TABLET | Freq: Two times a day (BID) | ORAL | Status: DC
  Filled 2023-10-16: qty 1

## 2023-10-16 MED ORDER — PIPERACILLIN-TAZOBACTAM 3.375 G IVPB 30 MIN: 3.3750 g | Freq: Three times a day (TID) | INTRAVENOUS | Status: DC

## 2023-10-16 MED ORDER — VANCOMYCIN HCL 1500 MG/300ML IV SOLN: 1500.0000 mg | INTRAVENOUS | Status: DC

## 2023-10-16 MED ORDER — FUROSEMIDE 10 MG/ML IJ SOLN
40.0000 mg | Freq: Once | INTRAMUSCULAR | Status: AC
  Administered 2023-10-16: 40 mg via INTRAVENOUS
  Filled 2023-10-16: qty 4

## 2023-10-16 MED ORDER — ACETAMINOPHEN 650 MG RE SUPP: 650.0000 mg | Freq: Four times a day (QID) | RECTAL | Status: DC | PRN

## 2023-10-16 MED ORDER — PIPERACILLIN-TAZOBACTAM 3.375 G IVPB: 3.3750 g | Freq: Three times a day (TID) | INTRAVENOUS | Status: DC

## 2023-10-16 MED ORDER — ACETAMINOPHEN 325 MG PO TABS: 650.0000 mg | ORAL_TABLET | Freq: Four times a day (QID) | ORAL | Status: DC | PRN

## 2023-10-16 NOTE — ED Provider Notes (Signed)
[place] EMERGENCY DEPARTMENT AT [facility] Provider Note   CSN: [id number] Arrival date & time: 10/16/23  0435     History  Chief Complaint  Patient presents with   Shortness of Breath    Douglas Walker is a 89 y.o. male.  Patient brought to the emergency department for evaluation of difficulty breathing.  Patient hospitalized twice in the last couple of weeks with pneumonia.  He completed antibiotics in the hospital and was discharged yesterday.  Patient became short of breath overnight.  Patient severely hypoxic when EMS arrived on the scene, continued to be hypoxic despite nonrebreather during transport.       Home Medications Prior to Admission medications   Medication Sig Start Date End Date Taking? Authorizing Provider  acetaminophen (TYLENOL) 325 MG tablet Take 2 tablets (650 mg total) by mouth every 4 (four) hours as needed for mild pain (or temp > 37.5 C (99.5 F)). 02/14/21   Angiulli, Mcarthur Rossetti, PA-C  apixaban (ELIQUIS) 2.5 MG TABS tablet Take 1 tablet (2.5 mg total) by mouth 2 (two) times daily. 09/24/22   Dunn, Tacey Ruiz, PA-C  Ascorbic Acid (VITAMIN C) 1000 MG tablet Take 1,000 mg by mouth daily.    [provider]  cetirizine (ZYRTEC) 10 MG tablet Take 10 mg by mouth daily.    [provider]  DULoxetine (CYMBALTA) 60 MG capsule Take 1 capsule (60 mg total) by mouth daily. 04/23/21   Lovorn, Aundra Millet, MD  furosemide (LASIX) 40 MG tablet Take 1 tablet (40 mg total) by mouth daily. 10/02/23   Christell Constant, MD  levothyroxine (SYNTHROID) 25 MCG tablet Take 1 tablet (25 mcg total) by mouth daily before breakfast. 02/14/21   Angiulli, Mcarthur Rossetti, PA-C  Multiple Vitamin (MULTIVITAMIN WITH MINERALS) TABS tablet Take 1 tablet by mouth daily.    [provider]  Multiple Vitamins-Minerals (OCUVITE ADULT 50+) CAPS Take 1 capsule by mouth daily.    [provider]  nitrofurantoin, macrocrystal-monohydrate, (MACROBID) 100 MG  capsule Take 100 mg by mouth at bedtime. 09/15/23   [provider]  nystatin ointment (MYCOSTATIN) Apply topically 2 (two) times daily. Apply to rash over R face 08/12/23   Jerald Kief, MD  polyethylene glycol (MIRALAX / GLYCOLAX) 17 g packet Take 17 g by mouth daily. Patient taking differently: Take 17 g by mouth daily as needed for moderate constipation. 07/08/21   Narda Bonds, MD  pregabalin (LYRICA) 50 MG capsule Take 50 mg by mouth at bedtime.    [provider]  topiramate (TOPAMAX) 50 MG tablet Take 1 tablet by mouth daily.    [provider]  traMADol (ULTRAM) 50 MG tablet Take 1 tablet (50 mg total) by mouth every 12 (twelve) hours as needed. 10/14/23   Hughie Closs, MD  vitamin B-12 (CYANOCOBALAMIN) 1000 MCG tablet Take 1 tablet (1,000 mcg total) by mouth every other day. 02/14/21   Angiulli, Mcarthur Rossetti, PA-C      Allergies    Sulfa antibiotics, Mysoline [primidone], and Zocor [simvastatin]    Review of Systems   Review of Systems  Physical Exam Updated Vital Signs BP (!) 159/76   Pulse 70   Temp (!) 101.5 F (38.6 C) (Rectal)   Resp (!) 23   SpO2 95%  Physical Exam Vitals and nursing note reviewed.  Constitutional:      General: He is not in acute distress.    Appearance: He is well-developed.  HENT:  Head: Normocephalic and atraumatic.     Mouth/Throat:     Mouth: Mucous membranes are moist.  Eyes:     General: Vision grossly intact. Gaze aligned appropriately.     Extraocular Movements: Extraocular movements intact.     Conjunctiva/sclera: Conjunctivae normal.  Cardiovascular:     Rate and Rhythm: Normal rate and regular rhythm.     Pulses: Normal pulses.     Heart sounds: Normal heart sounds, S1 normal and S2 normal. No murmur heard.    No friction rub. No gallop.  Pulmonary:     Effort: Pulmonary effort is normal. Tachypnea present. No respiratory distress.     Breath sounds: Examination of the left-middle field reveals  decreased breath sounds. Examination of the left-lower field reveals decreased breath sounds. Decreased breath sounds and rhonchi present.  Abdominal:     Palpations: Abdomen is soft.     Tenderness: There is no abdominal tenderness. There is no guarding or rebound.     Hernia: No hernia is present.  Musculoskeletal:        General: No swelling.     Cervical back: Full passive range of motion without pain, normal range of motion and neck supple. No pain with movement, spinous process tenderness or muscular tenderness. Normal range of motion.     Right lower leg: No edema.     Left lower leg: No edema.  Skin:    General: Skin is warm and dry.     Capillary Refill: Capillary refill takes less than 2 seconds.     Findings: No ecchymosis, erythema, lesion or wound.  Neurological:     Mental Status: He is alert and oriented to person, place, and time.     GCS: GCS eye subscore is 4. GCS verbal subscore is 5. GCS motor subscore is 6.     Cranial Nerves: Cranial nerves 2-12 are intact.     Sensory: Sensation is intact.     Motor: Motor function is intact. No weakness or abnormal muscle tone.     Coordination: Coordination is intact.  Psychiatric:        Mood and Affect: Mood normal.        Speech: Speech normal.        Behavior: Behavior normal.     ED Results / Procedures / Treatments   Labs (all labs ordered are listed, but only abnormal results are displayed) Labs Reviewed  COMPREHENSIVE METABOLIC PANEL - Abnormal; Notable for the following components:      Result Value   Chloride 95 (*)    Glucose, Bld 111 (*)    BUN 25 (*)    Creatinine, Ser 1.57 (*)    Calcium 10.4 (*)    Total Protein 8.5 (*)    Albumin 2.9 (*)    GFR, Estimated 40 (*)    Anion gap 17 (*)    All other components within normal limits  CBC WITH DIFFERENTIAL/PLATELET - Abnormal; Notable for the following components:   WBC 15.5 (*)    RBC 3.66 (*)    Hemoglobin 10.9 (*)    HCT 33.5 (*)    RDW 15.9 (*)     Neutro Abs 13.6 (*)    Abs Immature Granulocytes 0.15 (*)    All other components within normal limits  PROTIME-INR - Abnormal; Notable for the following components:   Prothrombin Time 15.8 (*)    All other components within normal limits  URINALYSIS, W/ REFLEX TO CULTURE (INFECTION SUSPECTED) - Abnormal; Notable for  the following components:   Color, Urine AMBER (*)    APPearance TURBID (*)    Hgb urine dipstick MODERATE (*)    Ketones, ur 5 (*)    Protein, ur 30 (*)    Leukocytes,Ua LARGE (*)    Bacteria, UA MANY (*)    All other components within normal limits  I-STAT CG4 LACTIC ACID, ED - Abnormal; Notable for the following components:   Lactic Acid, Venous 2.2 (*)    All other components within normal limits  I-STAT ARTERIAL BLOOD GAS, ED - Abnormal; Notable for the following components:   pO2, Arterial 71 (*)    Bicarbonate 28.1 (*)    Acid-Base Excess 3.0 (*)    Potassium 3.3 (*)    HCT 34.0 (*)    Hemoglobin 11.6 (*)    All other components within normal limits  RESP PANEL BY RT-PCR (RSV, FLU A&B, COVID)  RVPGX2  URINE CULTURE  CULTURE, BLOOD (ROUTINE X 2)  CULTURE, BLOOD (ROUTINE X 2)  APTT    EKG EKG Interpretation Date/Time:  Thursday October 16 2023 04:46:24 EDT Ventricular Rate:  72 PR Interval:    QRS Duration:  114 QT Interval:  462 QTC Calculation: 506 R Axis:   -47  Text Interpretation: VENTRICULAR PACING Confirmed by Gilda Crease [phone]) on 10/16/2023 5:24:01 AM  Radiology No results found.  Chf vs pneumonia by my read  Procedures .Critical Care  Performed by: Gilda Crease, MD Authorized by: Gilda Crease, MD   Critical care provider statement:    Critical care time (minutes):  30   Critical care was necessary to treat or prevent imminent or life-threatening deterioration of the following conditions:  Respiratory failure   Critical care was time spent personally by me on the following activities:  Development of  treatment plan with patient or surrogate, discussions with consultants, evaluation of patient's response to treatment, examination of patient, ordering and review of laboratory studies, ordering and review of radiographic studies, ordering and performing treatments and interventions, pulse oximetry, re-evaluation of patient's condition and review of old charts     Medications Ordered in ED Medications  acetaminophen (TYLENOL) suppository 650 mg (has no administration in time range)    ED Course/ Medical Decision Making/ A&P                                 Medical Decision Making Amount and/or Complexity of Data Reviewed External Data Reviewed: labs, radiology, ECG and notes. Labs: ordered. Decision-making details documented in ED Course. Radiology: ordered and independent interpretation performed. Decision-making details documented in ED Course. ECG/medicine tests: ordered and independent interpretation performed. Decision-making details documented in ED Course.   Presents to the emergency department for evaluation of difficulty breathing.  Patient very hypoxic prior to arrival.  Patient placed on BiPAP.  Chest x-ray with likely congestive heart failure, left opacity greater than right, cannot rule out pneumonia, especially in febrile patient.  Second review reveals that he was treated for sepsis secondary to pneumonia when he was hospitalized, discharged yesterday.  He was treated with Zosyn during hospitalization.  Restart Zosyn, and vancomycin.  Patient with fever to 101.5, tachypnea, leukocytosis, very slight lactic acidosis (2.2).  Hypoxia improved on BiPAP.  No hypotension/septic shock.        Final Clinical Impression(s) / ED Diagnoses Final diagnoses:  Sepsis, due to unspecified organism, unspecified whether acute organ dysfunction present (HCC)  Rx / DC Orders ED Discharge Orders     None         Horace Wishon, Canary Brim, MD 10/16/23 [phone]

## 2023-10-16 NOTE — Progress Notes (Signed)
RT note. Patient placed on venti mask at this time due to being a mouth breather. Patient on 14L-55%, Patient sat 97%, no labored breathing noted. RT will continue to monitor. RN aware.    10/16/23 1743  Therapy Vitals  Pulse Rate 69  Resp 15  MEWS Score/Color  MEWS Score 1  MEWS Score Color Green  Respiratory Assessment  Assessment Type Assess only  Respiratory Pattern Regular;Unlabored  Chest Assessment Chest expansion symmetrical  Bilateral Breath Sounds Diminished  Oxygen Therapy/Pulse Ox  O2 Device (S)  Venturi Mask  O2 Therapy Oxygen  O2 Flow Rate (L/min) 14 L/min  FiO2 (%) (S)  55 %  SpO2 97 %

## 2023-10-16 NOTE — Progress Notes (Signed)
Pharmacy Antibiotic Note  Douglas Walker is a 89 y.o. male admitted on 10/16/2023 with pneumonia.  Pharmacy has been consulted for Vancomycin dosing.  Patient received Zosyn 3.375g IV x1 over 30 min and Vancomycin 1500mg  IV x1 in the ED  WBC 15.5, Tmax 101.7, LA 1.3 SCr 1.57 (baseline SCr ~1.2-1.3)  Plan: Vancomycin 1500mg  IV q48h (eAUC ~490)    > Goal AUC 400-550    > Check vancomycin levels at steady state  Continue Zosyn 3.375g IV q8h (4 hour infusion) per MD F/u MRSA PCR Monitor daily CBC, temp, SCr, and for clinical signs of improvement  F/u cultures and de-escalate antibiotics as able   Height: 5\' 9"  (175.3 cm) Weight: 78 kg (171 lb 15.3 oz) IBW/kg (Calculated) : 70.7  Temp (24hrs), Avg:100.6 F (38.1 C), Min:98.5 F (36.9 C), Max:101.7 F (38.7 C)  Recent Labs  Lab 10/10/23 0854 10/14/23 0821 10/16/23 0443 10/16/23 0453 10/16/23 0707  WBC 7.6 7.5 15.5*  --   --   CREATININE 1.56* 1.49* 1.57*  --   --   LATICACIDVEN  --   --   --  2.2* 1.3    Estimated Creatinine Clearance: 27.5 mL/min (A) (by C-G formula based on SCr of 1.57 mg/dL (H)).    Allergies  Allergen Reactions   Sulfa Antibiotics Other (See Comments)    Weakness Dizziness  Sweats    Mysoline [Primidone] Other (See Comments)    Sedation    Zocor [Simvastatin] Other (See Comments)    Arthralgias Fatigue    Antimicrobials this admission: Zosyn 10/17 >>  Vancomycin 10/17 >>   Dose adjustments this admission: N/A  Microbiology results: 10/17 BCx: sent 10/17 UCx: sent  10/17 Sputum: sent  10/17 MRSA PCR: sent   Thank you for allowing pharmacy to be a part of this patient's care.  [name], PharmD, BCPS Clinical Pharmacist 10/16/2023 8:27 AM   Please refer to AMION for pharmacy phone number

## 2023-10-16 NOTE — Progress Notes (Signed)
SLP Cancellation Note  Patient Details Name: Douglas Walker MRN: [id number] DOB: 12-11-1925   Cancelled treatment:       Reason Eval/Treat Not Completed: Other (comment);Medical issues which prohibited therapy. Pt on BiPAP. Long talk with son about dysphagia. Son wants instrumental testing if pt recovers to an ability to participate. He would want pt to try any feasible method to prevent aspiration. Will check in tomorrow for MBS readiness. Strong suspicion for night time postprandial aspiration events given report. Keep HOB elevated.    Tiani Stanbery, Riley Nearing 10/16/2023, 2:42 PM

## 2023-10-16 NOTE — H&P (Addendum)
History and Physical    Patient: Douglas Walker HYQ:[id number] DOB: 03/08/1925 DOA: 10/16/2023 DOS: the patient was seen and examined on 10/16/2023 PCP: Georgann Housekeeper, MD  Patient coming from: Home  Chief Complaint:  Chief Complaint  Patient presents with   Shortness of Breath   HPI: Douglas Walker is a 89 y.o. male with medical history significant of PAF on Eliquis, HFpEF (EF 50-55%), tachy-brady syndrome s/p PPM, CKD stage IIIa, chronic anemia and thrombocytopenia, HTN, HLD, hypothyroidism, essential tremor, dementia, hard of hearing, recurrent UTI, BPH with chronic indwelling Foley catheter who presents back to the hospital with shortness of breath.   Patient had recently been hospitalized 10/4-10/7 for acute respiratory failure secondary to multifocal pneumonia in the setting of suspected aspiration.  Patient was evaluated by speech therapy and advised a dysphagia 3 diet.  He received IV antibiotics of Unasyn and transition to complete 6 days of Augmentin.  During his hospital stay he had also been treated for acute on chronic systolic congestive heart failure with short course of IV Lasix.  Subsequently, hospitalized 10/8-10/16 with severe sepsis secondary to pneumonia with possibility of a UTI.  Patient had received complete course with antibiotics of Zosyn and azithromycin with all cultures noted to be negative.  He was discharged yesterday on 2 L nasal cannula oxygen. Was fine and eeat dinner and watched tv. He went to bed at 9:15p.m. At 2:30-3 a.m. son was awoken by the patient coughing. Son checked on him an oxygen saturations were at 50% on 4L so they called EMS.  On EMS arrival patient's O2 saturations were documented to be 55% on 4 L of oxygen.  Patient was placed on a nonrebreather at 15 L with O2 sats improved only up to 78%.  He was reported to be dusky, malted and had purple extremities.  Patient had DNR form present.  In the emergency department patient was noted to be  febrile up to 101.7 F with tachypnea, and O2 saturations as low as 84% for which patient was placed on BiPAP.  Labs significant for WBC 15.5, hemoglobin 10.9, CO2 26, BUN 25, creatinine 1.57, calcium 10.4, anion gap 17, and lactic acid 2.2.  ABG noted pH 7.391, pCO2 46.2, pO2 71.  Chest x-ray showed a severe multi lobar bilateral bronchopneumonia left greater than right with small to moderate left-sided pleural effusion.  Influenza, COVID-19, and RSV screening were negative.  Urinalysis sent for moderate hemoglobin, large leukocytes, many bacteria, 50 RBCs/hpf, and greater than 50 WBCs.  Blood cultures were obtained.  Patient was given Tylenol suppository, vancomycin, and Zosyn.   Review of Systems: As mentioned in the history of present illness. All other systems reviewed and are negative. Past Medical History:  Diagnosis Date   Anemia    Arthritis    shoulders and back   Cancer (HCC)    skin cancers   Chronic diastolic (congestive) heart failure (HCC)    Chronic low back pain 12/15/2017   CKD (chronic kidney disease) stage 3, GFR 30-59 ml/min (HCC)    Essential tremor 02/28/2016   GERD (gastroesophageal reflux disease)    Hernia    History of hiatal hernia    Hyperlipemia    Hypertension    Macular degeneration    Neuromuscular disorder (HCC)    neuropathy   Paroxysmal atrial fibrillation (HCC) 10/28/2013   Presence of permanent cardiac pacemaker 12/04/2018   Pulmonary hypertension (HCC)    Stroke (HCC) 12/2020   Tachycardia-bradycardia syndrome (HCC) 09/13/2014  Thoracic aortic aneurysm (HCC)    Thrombocytopenia (HCC)    TIA (transient ischemic attack)    Past Surgical History:  Procedure Laterality Date   APPENDECTOMY     EYE SURGERY     HERNIA REPAIR     HERNIA REPAIR     INSERT / REPLACE / REMOVE PACEMAKER  12/04/2018   IRRIGATION AND DEBRIDEMENT ABSCESS Right 07/24/2020   Procedure: IRRIGATION AND DEBRIDEMENT HEMATOMA;  Surgeon: Abigail Miyamoto, MD;  Location: [facility] OR;   Service: General;  Laterality: Right;   MASS EXCISION Right 07/20/2020   Procedure: EXCISION OF RIGHT CHEST WALL MASS;  Surgeon: Almond Lint, MD;  Location: [facility] OR;  Service: General;  Laterality: Right;   PACEMAKER IMPLANT N/A 12/04/2018   Procedure: PACEMAKER IMPLANT;  Surgeon: Marinus Maw, MD;  Location: [facility] INVASIVE CV LAB;  Service: Cardiovascular;  Laterality: N/A;   Social History:  reports that he quit smoking about 60 years ago. His smoking use included cigarettes. He has never used smokeless tobacco. He reports that he does not drink alcohol and does not use drugs.  Allergies  Allergen Reactions   Sulfa Antibiotics Other (See Comments)    Weakness Dizziness  Sweats    Mysoline [Primidone] Other (See Comments)    Sedation    Zocor [Simvastatin] Other (See Comments)    Arthralgias Fatigue    Family History  Problem Relation Age of Onset   GI problems Mother    Other Sister        PAIN ISSUES   Hearing loss Sister    Blindness Sister    Heart attack Neg Hx    Stroke Neg Hx     Prior to Admission medications   Medication Sig Start Date End Date Taking? Authorizing Provider  acetaminophen (TYLENOL) 325 MG tablet Take 2 tablets (650 mg total) by mouth every 4 (four) hours as needed for mild pain (or temp > 37.5 C (99.5 F)). 02/14/21   Angiulli, Mcarthur Rossetti, PA-C  apixaban (ELIQUIS) 2.5 MG TABS tablet Take 1 tablet (2.5 mg total) by mouth 2 (two) times daily. 09/24/22   Dunn, Tacey Ruiz, PA-C  Ascorbic Acid (VITAMIN C) 1000 MG tablet Take 1,000 mg by mouth daily.    [provider]  cetirizine (ZYRTEC) 10 MG tablet Take 10 mg by mouth daily.    [provider]  DULoxetine (CYMBALTA) 60 MG capsule Take 1 capsule (60 mg total) by mouth daily. 04/23/21   Lovorn, Aundra Millet, MD  furosemide (LASIX) 40 MG tablet Take 1 tablet (40 mg total) by mouth daily. 10/02/23   Christell Constant, MD  levothyroxine (SYNTHROID) 25 MCG tablet Take 1 tablet (25 mcg total) by mouth  daily before breakfast. 02/14/21   Angiulli, Mcarthur Rossetti, PA-C  Multiple Vitamin (MULTIVITAMIN WITH MINERALS) TABS tablet Take 1 tablet by mouth daily.    [provider]  Multiple Vitamins-Minerals (OCUVITE ADULT 50+) CAPS Take 1 capsule by mouth daily.    [provider]  nitrofurantoin, macrocrystal-monohydrate, (MACROBID) 100 MG capsule Take 100 mg by mouth at bedtime. 09/15/23   [provider]  nystatin ointment (MYCOSTATIN) Apply topically 2 (two) times daily. Apply to rash over R face 08/12/23   Jerald Kief, MD  polyethylene glycol (MIRALAX / GLYCOLAX) 17 g packet Take 17 g by mouth daily. Patient taking differently: Take 17 g by mouth daily as needed for moderate constipation. 07/08/21   Narda Bonds, MD  pregabalin (LYRICA) 50 MG capsule Take 50 mg by  mouth at bedtime.    [provider]  topiramate (TOPAMAX) 50 MG tablet Take 1 tablet by mouth daily.    [provider]  traMADol (ULTRAM) 50 MG tablet Take 1 tablet (50 mg total) by mouth every 12 (twelve) hours as needed. 10/14/23   Hughie Closs, MD  vitamin B-12 (CYANOCOBALAMIN) 1000 MCG tablet Take 1 tablet (1,000 mcg total) by mouth every other day. 02/14/21   Charlton Amor, PA-C    Physical Exam: Vitals:   10/16/23 0451 10/16/23 0451 10/16/23 0634 10/16/23 0652  BP:      Pulse:      Resp:      Temp: (!) 101.5 F (38.6 C)   (!) 101.7 F (38.7 C)  TempSrc: Rectal   Rectal  SpO2:  95%    Weight:   78 kg   Height:   5\' 9"  (1.753 m)    Exam  Constitutional: Elderly male who appears to be lethargic not easily aroused Eyes: PERRL, lids and conjunctivae normal ENMT: Mucous membranes are moist. Posterior pharynx clear of any exudate or lesions.Normal dentition.  Neck: normal, supple  Respiratory: Decreased overall breath sounds with rhonchi noted but laterally. Cardiovascular: Regular rate and rhythm,   No extremity edema. 2+ pedal pulses.  Abdomen: no tenderness, no masses  palpated.  Bowel sounds positive.  Musculoskeletal: no clubbing / cyanosis. No joint deformity upper and lower extremities. Good ROM, no contractures. Normal muscle tone.  Skin: no rashes, lesions, ulcers.  Neurologic: CN 2-12 grossly intact.  Appears able to move all extremities Psychiatric: Unable to assess at this time.  Data Reviewed:  EKG revealed ventricularly paced rhythm at 72 bpm.  Reviewed labs, imaging, and pertinent records as documented in this note.  Assessment and Plan:  Acute respiratory failure with hypoxia   Sepsis secondary to pneumonia Patient presented with cough and shortness of breath.  Initial O2 saturations noted to be as low as 55% on 4 L nasal cannula oxygen by EMS.  Switched to nonrebreather by EMS with mild improvement up to 78%.  ABG noted pH within normal limits with pCO2 71.  In the ED patient was switched to BiPAP with O2 saturations improved to 100%.  He was found to be febrile up to 101.7 F with tachypnea and white blood cell count elevated at 15.5 meeting SIRS criteria.  Chest x-ray noting worsening severe multi lobar bilateral bronchopneumonia left greater than right with small to moderate left-sided pleural effusion.  Lactic acid was elevated at 2.2.  Blood cultures were obtained.  Patient was started on empiric antibiotics of vancomycin and Zosyn.  Question if patient is aspirating as the cause of acute worsening in his shortness of breath given to prior admissions this month. -Admit to a progressive bed -Pneumonia order set utilized -Aspiration precautions with elevation head of the bed -Continuous pulse oximetry with oxygen maintain O2 saturation greater than 90% -Continue BiPAP.  Wean when medically appropriate -Incentive spirometry and flutter valve once able -N.p.o.  -Follow-up blood cultures -Check procalcitonin, MRSA screen, urine Legionella, urine strep -Continue empiric antibiotics of vancomycin and Zosyn -Mucinex -Tylenol as  needed  Possible catheter associated urinary tract infection Prior to arrival.  Urinalysis again positive for moderate hemoglobin, large leukocytes, many bacteria, greater than 50 WBCs, and greater than 50 RBCs/hpf.  Urine cultures from 10/4 had grown out Proteus mirabilis and Morganella Morganii that were sensitive to Zosyn.  Urine culture from 10/8 noted insignificant growth.  Suspect likely colonization. -Recheck urine culture -  Continue Zosyn -Consider need to formally consult ID once cultures result  Heart failure with mildly reduced EF Acute on chronic.  On physical exam patient with no significant lower extremity swelling.  Chest x-ray also noted small to moderate left-sided pleural effusion.  Last echocardiogram noted EF to be 50 to 55%. -Strict I/O's and daily weights -Check BNP (475.1 which is elevated when compared to prior hospitalist patient to 249.6.) -Lasix 40 mg IV x 1 dose.  Reevaluate in a.m. and determine need of further IV diuresis.  Paroxysmal atrial fibrillation on chronic anticoagulation Sick sinus syndrome s/p permanent pacemaker Patient is not on any AV nodal blocking agents. -Resume Eliquis once able  Dysphagia Patient had been noted during initial hospitalization having dysphagia and had been placed on dysphagia 3 diet after being seen by speech therapy. -Aspiration precautions with elevation head of the bed -Speech therapy to reevaluate once off BiPAP  Chronic kidney disease stage IIIb Creatinine 1.57 with BUN 25 which appears near patient's baseline creatinine. -Recheck kidney function in a.m.  Normocytic anemia Chronic.  Hemoglobin 10.9 g/dL which appears improved from prior. -Recheck CBC in a.m.  Dementia Patient has very hard of hearing -Delirium precautions  Hypothyroidism -Continue levothyroxine  Generalized weakness Patient had been evaluated during prior hospitalization and recommended to go to SNF, but despite peer-to-peer was declined by  insurance as patient noted to be at his baseline able to walk 100 feet. -PT/OT to evaluate and treat once able  DVT prophylaxis: Eliquis Advance Care Planning:   Code Status: Limited: Do not attempt resuscitation (DNR) -DNR-LIMITED -Do Not Intubate/DNI    Consults: None  Family Communication: Called son and updated over the phone  Severity of Illness: The appropriate patient status for this patient is INPATIENT. Inpatient status is judged to be reasonable and necessary in order to provide the required intensity of service to ensure the patient's safety. The patient's presenting symptoms, physical exam findings, and initial radiographic and laboratory data in the context of their chronic comorbidities is felt to place them at high risk for further clinical deterioration. Furthermore, it is not anticipated that the patient will be medically stable for discharge from the hospital within 2 midnights of admission.   * I certify that at the point of admission it is my clinical judgment that the patient will require inpatient hospital care spanning beyond 2 midnights from the point of admission due to high intensity of service, high risk for further deterioration and high frequency of surveillance required.*  Author: Clydie Braun, MD 10/16/2023 7:11 AM  For on call review [email or web address].

## 2023-10-16 NOTE — ED Triage Notes (Addendum)
Pt from home. EMS called bc pt was experiencing worsening breathing. Been hospitalized several times for pneumonia recently. EMS arrived and pt's sats 50%. Pt placed on nonrebreather. And sats still in 80s% . Pt appears dusky, mottled, and extremities purple. DNR form with patient.

## 2023-10-16 NOTE — Progress Notes (Signed)
ED Pharmacy Antibiotic Sign Off An antibiotic consult was received from an ED provider for Vancomycin/Zosyn per pharmacy dosing for sepsis/pneumonia. A chart review was completed to assess appropriateness.   The following one time order(s) were placed:  Vancomycin 1500 mg IV x 1 Zosyn 3.375g IV x 1  Further antibiotic and/or antibiotic pharmacy consults should be ordered by the admitting provider if indicated.   Abran Duke, PharmD, BCPS Clinical Pharmacist Phone: [phone]

## 2023-10-16 NOTE — ED Notes (Signed)
Pt's sats 85% on bipap.

## 2023-10-16 NOTE — ED Notes (Signed)
ED TO INPATIENT HANDOFF REPORT  ED Nurse Name and Phone #: Rip Hawes 77  S Name/Age/Gender Douglas Walker 89 y.o. male Room/Bed: 024C/024C  Code Status   Code Status: Limited: Do not attempt resuscitation (DNR) -DNR-LIMITED -Do Not Intubate/DNI   Home/SNF/Other Home Patient oriented to: self Is this baseline? Yes   Triage Complete: Triage complete  Chief Complaint Severe sepsis (HCC) [A41.9, R65.20]  Triage Note Pt from home. EMS called bc pt was experiencing worsening breathing. Been hospitalized several times for pneumonia recently. EMS arrived and pt's sats 50%. Pt placed on nonrebreather. And sats still in 80s% . Pt appears dusky, mottled, and extremities purple. DNR form with patient.    Allergies Allergies  Allergen Reactions   Sulfa Antibiotics Other (See Comments)    Weakness Dizziness  Sweats    Mysoline [Primidone] Other (See Comments)    Sedation    Zocor [Simvastatin] Other (See Comments)    Arthralgias Fatigue    Level of Care/Admitting Diagnosis ED Disposition     ED Disposition  Admit   Condition  --   Comment  Hospital Area: [facility] [[id number]]  Level of Care: Progressive [102]  Admit to Progressive based on following criteria: MULTISYSTEM THREATS such as stable sepsis, metabolic/electrolyte imbalance with or without encephalopathy that is responding to early treatment.  Admit to Progressive based on following criteria: RESPIRATORY PROBLEMS hypoxemic/hypercapnic respiratory failure that is responsive to NIPPV (BiPAP) or High Flow Nasal Cannula (6-80 lpm). Frequent assessment/intervention, no > Q2 hrs < Q4 hrs, to maintain oxygenation and pulmonary hygiene.  May admit patient to [facility] or [facility] if equivalent level of care is available:: Yes  Covid Evaluation: Asymptomatic - no recent exposure (last 10 days) testing not required  Diagnosis: Severe sepsis [facility]) [[id number]]  Admitting Physician: Darlin Drop  [[id number]]  Attending Physician: Darlin Drop [[id number]]  Certification:: I certify this patient will need inpatient services for at least 2 midnights  Expected Medical Readiness: 10/18/2023          B Medical/Surgery History Past Medical History:  Diagnosis Date   Anemia    Arthritis    shoulders and back   Cancer (HCC)    skin cancers   Chronic diastolic (congestive) heart failure (HCC)    Chronic low back pain 12/15/2017   CKD (chronic kidney disease) stage 3, GFR 30-59 ml/min (HCC)    Essential tremor 02/28/2016   GERD (gastroesophageal reflux disease)    Hernia    History of hiatal hernia    Hyperlipemia    Hypertension    Macular degeneration    Neuromuscular disorder (HCC)    neuropathy   Paroxysmal atrial fibrillation (HCC) 10/28/2013   Presence of permanent cardiac pacemaker 12/04/2018   Pulmonary hypertension (HCC)    Stroke (HCC) 12/2020   Tachycardia-bradycardia syndrome (HCC) 09/13/2014   Thoracic aortic aneurysm (HCC)    Thrombocytopenia (HCC)    TIA (transient ischemic attack)    Past Surgical History:  Procedure Laterality Date   APPENDECTOMY     EYE SURGERY     HERNIA REPAIR     HERNIA REPAIR     INSERT / REPLACE / REMOVE PACEMAKER  12/04/2018   IRRIGATION AND DEBRIDEMENT ABSCESS Right 07/24/2020   Procedure: IRRIGATION AND DEBRIDEMENT HEMATOMA;  Surgeon: Abigail Miyamoto, MD;  [facility]: [facility];  Service: General;  Laterality: Right;   MASS EXCISION Right 07/20/2020   Procedure: EXCISION OF RIGHT CHEST WALL MASS;  Surgeon: Almond Lint, MD;  Location: [facility] OR;  Service: General;  Laterality: Right;   PACEMAKER IMPLANT N/A 12/04/2018   Procedure: PACEMAKER IMPLANT;  Surgeon: Marinus Maw, MD;  Location: [facility] INVASIVE CV LAB;  Service: Cardiovascular;  Laterality: N/A;     A IV Location/Drains/Wounds Patient Lines/Drains/Airways Status     Active Line/Drains/Airways     Name Placement date Placement time Site Days   Peripheral IV 10/16/23 20 G  Right Antecubital 10/16/23  0440  Antecubital  less than 1   Urethral Catheter Huntley Dec RN Coude 20 Fr. 10/07/23  1137  Coude  9   Pressure Injury 07/30/23 Sacrum Stage 2 -  Partial thickness loss of dermis presenting as a shallow open injury with a red, pink wound bed without slough. 07/30/23  1156  -- 78   Pressure Injury 07/30/23 Hip Right Unstageable - Full thickness tissue loss in which the base of the injury is covered by slough (yellow, tan, gray, green or brown) and/or eschar (tan, brown or black) in the wound bed. 07/30/23  1156  -- 78   Pressure Injury 10/03/23 Vertebral column Lower Deep Tissue Pressure Injury - Purple or maroon localized area of discolored intact skin or blood-filled blister due to damage of underlying soft tissue from pressure and/or shear. Area of purplish discolora 10/03/23  2059  -- 13            Intake/Output Last 24 hours  Intake/Output Summary (Last 24 hours) at 10/16/2023 1401 Last data filed at 10/16/2023 0857 Gross per 24 hour  Intake 350.18 ml  Output --  Net 350.18 ml    Labs/Imaging Results for orders placed or performed during the hospital encounter of 10/16/23 (from the past 48 hour(s))  Resp panel by RT-PCR (RSV, Flu A&B, Covid) Anterior Nasal Swab     Status: None   Collection Time: 10/16/23  4:43 AM   Specimen: Anterior Nasal Swab  Result Value Ref Range   SARS Coronavirus 2 by RT PCR NEGATIVE NEGATIVE   Influenza A by PCR NEGATIVE NEGATIVE   Influenza B by PCR NEGATIVE NEGATIVE    Comment: (NOTE) The Xpert Xpress SARS-CoV-2/FLU/RSV plus assay is intended as an aid in the diagnosis of influenza from Nasopharyngeal swab specimens and should not be used as a sole basis for treatment. Nasal washings and aspirates are unacceptable for Xpert Xpress SARS-CoV-2/FLU/RSV testing.  Fact Sheet for Patients: BloggerCourse.com  Fact Sheet for Healthcare Providers: SeriousBroker.it  This test is  not yet approved or cleared by the [place] FDA and has been authorized for detection and/or diagnosis of SARS-CoV-2 by FDA under an Emergency Use Authorization (EUA). This EUA will remain in effect (meaning this test can be used) for the duration of the COVID-19 declaration under Section 564(b)(1) of the Act, 21 U.S.C. section 360bbb-3(b)(1), unless the authorization is terminated or revoked.     Resp Syncytial Virus by PCR NEGATIVE NEGATIVE    Comment: (NOTE) Fact Sheet for Patients: BloggerCourse.com  Fact Sheet for Healthcare Providers: SeriousBroker.it  This test is not yet approved or cleared by the [place] FDA and has been authorized for detection and/or diagnosis of SARS-CoV-2 by FDA under an Emergency Use Authorization (EUA). This EUA will remain in effect (meaning this test can be used) for the duration of the COVID-19 declaration under Section 564(b)(1) of the Act, 21 U.S.C. section 360bbb-3(b)(1), unless the authorization is terminated or revoked.  Performed at [facility], [address] [place], [place] [address]   Comprehensive metabolic panel  Status: Abnormal   Collection Time: 10/16/23  4:43 AM  Result Value Ref Range   Sodium 138 135 - 145 mmol/L   Potassium 3.7 3.5 - 5.1 mmol/L   Chloride 95 (L) 98 - 111 mmol/L   CO2 26 22 - 32 mmol/L   Glucose, Bld 111 (H) 70 - 99 mg/dL    Comment: Glucose reference range applies only to samples taken after fasting for at least 8 hours.   BUN 25 (H) 8 - 23 mg/dL   Creatinine, Ser 1.61 (H) 0.61 - 1.24 mg/dL   Calcium 09.6 (H) 8.9 - 10.3 mg/dL   Total Protein 8.5 (H) 6.5 - 8.1 g/dL   Albumin 2.9 (L) 3.5 - 5.0 g/dL   AST 23 15 - 41 U/L   ALT 16 0 - 44 U/L   Alkaline Phosphatase 96 38 - 126 U/L   Total Bilirubin 0.7 0.3 - 1.2 mg/dL   GFR, Estimated 40 (L) >60 mL/min    Comment: (NOTE) Calculated using the CKD-EPI Creatinine Equation (2021)     Anion gap 17 (H) 5 - 15    Comment: Performed at [facility], [address], [place], [place] [address]  CBC with Differential     Status: Abnormal   Collection Time: 10/16/23  4:43 AM  Result Value Ref Range   WBC 15.5 (H) 4.0 - 10.5 K/uL   RBC 3.66 (L) 4.22 - 5.81 MIL/uL   Hemoglobin 10.9 (L) 13.0 - 17.0 g/dL   HCT 98.1 (L) 19.1 - 47.8 %   MCV 91.5 80.0 - 100.0 fL   MCH 29.8 26.0 - 34.0 pg   MCHC 32.5 30.0 - 36.0 g/dL   RDW 29.5 (H) 62.1 - 30.8 %   Platelets 227 150 - 400 K/uL   nRBC 0.0 0.0 - 0.2 %   Neutrophils Relative % 88 %   Neutro Abs 13.6 (H) 1.7 - 7.7 K/uL   Lymphocytes Relative 6 %   Lymphs Abs 1.0 0.7 - 4.0 K/uL   Monocytes Relative 5 %   Monocytes Absolute 0.7 0.1 - 1.0 K/uL   Eosinophils Relative 0 %   Eosinophils Absolute 0.0 0.0 - 0.5 K/uL   Basophils Relative 0 %   Basophils Absolute 0.1 0.0 - 0.1 K/uL   Immature Granulocytes 1 %   Abs Immature Granulocytes 0.15 (H) 0.00 - 0.07 K/uL    Comment: Performed at [facility], [address], [place], [place] [address]  Protime-INR     Status: Abnormal   Collection Time: 10/16/23  4:43 AM  Result Value Ref Range   Prothrombin Time 15.8 (H) 11.4 - 15.2 seconds   INR 1.2 0.8 - 1.2    Comment: (NOTE) INR goal varies based on device and disease states. Performed at [facility], [address] [place], [place] [address]   APTT     Status: None   Collection Time: 10/16/23  4:43 AM  Result Value Ref Range   aPTT 30 24 - 36 seconds    Comment: Performed at [facility], [address] [place], [place] [address]  Urinalysis, w/ Reflex to Culture (Infection Suspected) -Urine, Catheterized; Indwelling urinary catheter     Status: Abnormal   Collection Time: 10/16/23  4:43 AM  Result Value Ref Range   Specimen Source URINE, CATHETERIZED    Color, Urine AMBER (A) YELLOW    Comment: BIOCHEMICALS MAY BE AFFECTED BY COLOR   APPearance TURBID (A) CLEAR  Specific Gravity, Urine 1.015 1.005 -  1.030   pH 5.0 5.0 - 8.0   Glucose, UA NEGATIVE NEGATIVE mg/dL   Hgb urine dipstick MODERATE (A) NEGATIVE   Bilirubin Urine NEGATIVE NEGATIVE   Ketones, ur 5 (A) NEGATIVE mg/dL   Protein, ur 30 (A) NEGATIVE mg/dL   Nitrite NEGATIVE NEGATIVE   Leukocytes,Ua LARGE (A) NEGATIVE   RBC / HPF >50 0 - 5 RBC/hpf   WBC, UA >50 0 - 5 WBC/hpf    Comment:        Reflex urine culture not performed if WBC <=10, OR if Squamous epithelial cells >5. If Squamous epithelial cells >5 suggest recollection.    Bacteria, UA MANY (A) NONE SEEN   Squamous Epithelial / HPF 0-5 0 - 5 /HPF   WBC Clumps PRESENT    Hyaline Casts, UA PRESENT     Comment: Performed at [facility], [address], [place], [place] [address]  Urine Culture     Status: None (Preliminary result)   Collection Time: 10/16/23  4:43 AM   Specimen: Urine, Catheterized  Result Value Ref Range   Specimen Description URINE, CATHETERIZED    Special Requests      NONE Reflexed from [id number] Performed at [facility], [address], [place], [place] [address]    Culture PENDING    Report Status PENDING   I-Stat arterial blood gas, ED     Status: Abnormal   Collection Time: 10/16/23  4:52 AM  Result Value Ref Range   pH, Arterial 7.391 7.35 - 7.45   pCO2 arterial 46.2 32 - 48 mmHg   pO2, Arterial 71 (L) 83 - 108 mmHg   Bicarbonate 28.1 (H) 20.0 - 28.0 mmol/L   TCO2 29 22 - 32 mmol/L   O2 Saturation 94 %   Acid-Base Excess 3.0 (H) 0.0 - 2.0 mmol/L   Sodium 138 135 - 145 mmol/L   Potassium 3.3 (L) 3.5 - 5.1 mmol/L   Calcium, Ion 1.28 1.15 - 1.40 mmol/L   HCT 34.0 (L) 39.0 - 52.0 %   Hemoglobin 11.6 (L) 13.0 - 17.0 g/dL   Patient temperature 65.7 C    Sample type ARTERIAL   I-Stat Lactic Acid, ED     Status: Abnormal   Collection Time: 10/16/23  4:53 AM  Result Value Ref Range   Lactic Acid, Venous 2.2 (HH) 0.5 - 1.9 mmol/L   Comment NOTIFIED PHYSICIAN   I-Stat Lactic Acid, ED     Status: None   Collection Time:  10/16/23  7:07 AM  Result Value Ref Range   Lactic Acid, Venous 1.3 0.5 - 1.9 mmol/L  Procalcitonin     Status: None   Collection Time: 10/16/23  8:45 AM  Result Value Ref Range   Procalcitonin 6.18 ng/mL    Comment:        Interpretation: PCT > 2 ng/mL: Systemic infection (sepsis) is likely, unless other causes are known. (NOTE)       Sepsis PCT Algorithm           Lower Respiratory Tract                                      Infection PCT Algorithm    ----------------------------     ----------------------------         PCT < 0.25 ng/mL  PCT < 0.10 ng/mL          Strongly encourage             Strongly discourage   discontinuation of antibiotics    initiation of antibiotics    ----------------------------     -----------------------------       PCT 0.25 - 0.50 ng/mL            PCT 0.10 - 0.25 ng/mL               OR       >80% decrease in PCT            Discourage initiation of                                            antibiotics      Encourage discontinuation           of antibiotics    ----------------------------     -----------------------------         PCT >= 0.50 ng/mL              PCT 0.26 - 0.50 ng/mL               AND       <80% decrease in PCT              Encourage initiation of                                             antibiotics       Encourage continuation           of antibiotics    ----------------------------     -----------------------------        PCT >= 0.50 ng/mL                  PCT > 0.50 ng/mL               AND         increase in PCT                  Strongly encourage                                      initiation of antibiotics    Strongly encourage escalation           of antibiotics                                     -----------------------------                                           PCT <= 0.25 ng/mL                                                 OR                                        >  80% decrease in PCT                                       Discontinue / Do not initiate                                             antibiotics  Performed at [facility], [address], [place], [place] [address]   Brain natriuretic peptide     Status: Abnormal   Collection Time: 10/16/23  8:45 AM  Result Value Ref Range   B Natriuretic Peptide 475.1 (H) 0.0 - 100.0 pg/mL    Comment: Performed at [facility], [address] [place], [place] [address]   DG Chest Port 1 View  Result Date: 10/16/2023 CLINICAL DATA:  89 year old male with shortness of breath. EXAM: PORTABLE CHEST 1 VIEW COMPARISON:  Chest x-ray 10/07/2023. FINDINGS: Diffuse interstitial prominence and widespread peribronchial cuffing with patchy multifocal airspace disease noted throughout the lungs bilaterally (left-greater-than-right), with worsened aeration, particularly in the left lung compared to the prior study. Small to moderate left pleural effusion. No definite right pleural effusion. No pneumothorax. Heart size is normal. Atherosclerotic calcifications in the thoracic aorta. Left-sided pacemaker device in place with lead tips projecting over the expected location of the right ventricle. IMPRESSION: 1. Severe multilobar bilateral bronchopneumonia (left-greater-than-right), worsened in the left lung compared to the prior study. 2. Small to moderate left pleural effusion. 3. Aortic atherosclerosis. Electronically Signed   By: Trudie Reed M.D.   On: 10/16/2023 06:58    Pending Labs Unresulted Labs (From admission, onward)     Start     Ordered   10/17/23 0500  CBC  Tomorrow morning,   R        10/16/23 0753   10/17/23 0500  Comprehensive metabolic panel  Tomorrow morning,   R        10/16/23 0753   10/16/23 0747  Legionella Pneumophila Serogp 1 Ur Ag  (COPD / Pneumonia / Cellulitis / Lower Extremity Wound)  Once,   R        10/16/23 0753   10/16/23 0747  Strep pneumoniae urinary antigen  (COPD / Pneumonia / Cellulitis / Lower  Extremity Wound)  Once,   R        10/16/23 0753   10/16/23 0747  Expectorated Sputum Assessment w Gram Stain, Rflx to Resp Cult  (COPD / Pneumonia / Cellulitis / Lower Extremity Wound)  Once,   R        10/16/23 0753   10/16/23 0747  MRSA Next Gen by PCR, Nasal  Once,   R        10/16/23 0753   10/16/23 0437  Blood Culture (routine x 2)  (Undifferentiated presentation (screening labs and basic nursing orders))  BLOOD CULTURE X 2,   STAT      10/16/23 0436            Vitals/Pain Today's Vitals   10/16/23 0930 10/16/23 1047 10/16/23 1100 10/16/23 1332  BP: (!) 94/53 (!) 92/53 104/61   Pulse: 70 70 70   Resp: 19 (!) 22 (!) 23   Temp:   98.8 F (37.1 C)   TempSrc:   Rectal   SpO2: 97% 96% 98%  Weight:      Height:      PainSc:  0-No pain 0-No pain 0-No pain    Isolation Precautions No active isolations  Medications Medications  sodium chloride flush (NS) 0.9 % injection 3 mL (3 mLs Intravenous Not Given 10/16/23 1046)  acetaminophen (TYLENOL) tablet 650 mg (has no administration in time range)    Or  acetaminophen (TYLENOL) suppository 650 mg (has no administration in time range)  albuterol (PROVENTIL) (2.5 MG/3ML) 0.083% nebulizer solution 2.5 mg (has no administration in time range)  guaiFENesin (MUCINEX) 12 hr tablet 600 mg (600 mg Oral Not Given 10/16/23 1046)  piperacillin-tazobactam (ZOSYN) IVPB 3.375 g (3.375 g Intravenous New Bag/Given 10/16/23 1330)  vancomycin (VANCOREADY) IVPB 1500 mg/300 mL (has no administration in time range)  acetaminophen (TYLENOL) suppository 650 mg (650 mg Rectal Given 10/16/23 0611)  vancomycin (VANCOREADY) IVPB 1500 mg/300 mL (0 mg Intravenous Stopped 10/16/23 0857)  piperacillin-tazobactam (ZOSYN) IVPB 3.375 g (0 g Intravenous Stopped 10/16/23 0641)  furosemide (LASIX) injection 40 mg (40 mg Intravenous Given 10/16/23 1328)    Mobility non-ambulatory     Focused Assessments Pulmonary Assessment Handoff:  Lung sounds:  Bilateral Breath Sounds: Clear L Breath Sounds: Diminished, Coarse crackles O2 Device: Bi-PAP O2 Flow Rate (L/min): 15 L/min    R Recommendations: See Admitting Provider Note  Report given to:   Additional Notes:  Remains on Bipap. Tolerating well. Foley catheter with small amount of dark amber urine. Will follow commands. Family left before I came in at 7 and haven't seen them yet.

## 2023-10-17 ENCOUNTER — Ambulatory Visit: Payer: Medicare Other

## 2023-10-17 DIAGNOSIS — A419 Sepsis, unspecified organism: Secondary | ICD-10-CM | POA: Diagnosis not present

## 2023-10-17 DIAGNOSIS — J9601 Acute respiratory failure with hypoxia: Secondary | ICD-10-CM | POA: Diagnosis not present

## 2023-10-17 DIAGNOSIS — J189 Pneumonia, unspecified organism: Secondary | ICD-10-CM | POA: Diagnosis not present

## 2023-10-17 LAB — COMPREHENSIVE METABOLIC PANEL
ALT: 13 U/L (ref 0–44)
AST: 25 U/L (ref 15–41)
Albumin: 2.3 g/dL — ABNORMAL LOW (ref 3.5–5.0)
Alkaline Phosphatase: 78 U/L (ref 38–126)
Anion gap: 12 (ref 5–15)
BUN: 34 mg/dL — ABNORMAL HIGH (ref 8–23)
CO2: 28 mmol/L (ref 22–32)
Calcium: 9.6 mg/dL (ref 8.9–10.3)
Chloride: 99 mmol/L (ref 98–111)
Creatinine, Ser: 2.04 mg/dL — ABNORMAL HIGH (ref 0.61–1.24)
GFR, Estimated: 29 mL/min — ABNORMAL LOW (ref >60)
Glucose, Bld: 105 mg/dL — ABNORMAL HIGH (ref 70–99)
Potassium: 3.8 mmol/L (ref 3.5–5.1)
Sodium: 139 mmol/L (ref 135–145)
Total Bilirubin: 1.3 mg/dL — ABNORMAL HIGH (ref 0.3–1.2)
Total Protein: 7.2 g/dL (ref 6.5–8.1)

## 2023-10-17 LAB — BLOOD CULTURE ID PANEL (REFLEXED) - BCID2

## 2023-10-17 LAB — CBC
HCT: 28.2 % — ABNORMAL LOW (ref 39.0–52.0)
Hemoglobin: 9.2 g/dL — ABNORMAL LOW (ref 13.0–17.0)
MCH: 30.3 pg (ref 26.0–34.0)
MCHC: 32.6 g/dL (ref 30.0–36.0)
MCV: 92.8 fL (ref 80.0–100.0)
Platelets: 173 10*3/uL (ref 150–400)
RBC: 3.04 MIL/uL — ABNORMAL LOW (ref 4.22–5.81)
RDW: 16.1 % — ABNORMAL HIGH (ref 11.5–15.5)
WBC: 20.5 10*3/uL — ABNORMAL HIGH (ref 4.0–10.5)
nRBC: 0 % (ref 0.0–0.2)

## 2023-10-17 LAB — LEGIONELLA PNEUMOPHILA SEROGP 1 UR AG: L. pneumophila Serogp 1 Ur Ag: NEGATIVE

## 2023-10-17 LAB — STREP PNEUMONIAE URINARY ANTIGEN: Strep Pneumo Urinary Antigen: NEGATIVE

## 2023-10-17 MED ORDER — ENOXAPARIN SODIUM 30 MG/0.3ML IJ SOSY: 30.0000 mg | PREFILLED_SYRINGE | INTRAMUSCULAR | Status: DC

## 2023-10-17 MED ORDER — ORAL CARE MOUTH RINSE
15.0000 mL | OROMUCOSAL | Status: DC
  Administered 2023-10-17 – 2023-10-19 (×9): 15 mL via OROMUCOSAL

## 2023-10-17 MED ORDER — MORPHINE SULFATE (PF) 2 MG/ML IV SOLN
1.0000 mg | INTRAVENOUS | Status: DC | PRN
  Administered 2023-10-17 (×2): 1 mg via INTRAVENOUS
  Administered 2023-10-18 (×2): 2 mg via INTRAVENOUS
  Administered 2023-10-18 – 2023-10-19 (×4): 1 mg via INTRAVENOUS
  Administered 2023-10-19: 2 mg via INTRAVENOUS
  Filled 2023-10-17 (×9): qty 1

## 2023-10-17 MED ORDER — LINEZOLID 600 MG/300ML IV SOLN
600.0000 mg | Freq: Two times a day (BID) | INTRAVENOUS | Status: DC
  Administered 2023-10-18: 600 mg via INTRAVENOUS
  Filled 2023-10-17: qty 300

## 2023-10-17 MED ORDER — ORAL CARE MOUTH RINSE: 15.0000 mL | OROMUCOSAL | Status: DC | PRN

## 2023-10-17 MED ORDER — ENOXAPARIN SODIUM 40 MG/0.4ML IJ SOSY
40.0000 mg | PREFILLED_SYRINGE | INTRAMUSCULAR | Status: DC
  Administered 2023-10-17: 40 mg via SUBCUTANEOUS
  Filled 2023-10-17: qty 0.4

## 2023-10-17 NOTE — Progress Notes (Signed)
SLP Cancellation Note  Patient Details Name: MAVERIK BUETI MRN: [id number] DOB: 06-Feb-1925   Cancelled treatment:       Reason Eval/Treat Not Completed: Medical issues which prohibited therapy. Pt not medically ready for diet or MBS. Prognosis poor. Discussed with MD. Will f/u    Cyanna Neace, Riley Nearing 10/17/2023, 10:19 AM

## 2023-10-17 NOTE — IPAL (Signed)
  Interdisciplinary Goals of Care Family Meeting   Date carried out: 10/17/2023  Location of the meeting: Bedside  Member's involved: Physician, Social Worker, and Family Member or next of kin  Durable Power of Attorney or acting medical decision maker: Son  Discussion: We discussed goals of care for [employer] .  Son understands multiple comorbidities, that progressive dysphagia, dementia, multiple hospitalization recently, he understands he is approaching end-of-life, at this point he would like escalation of care, and to proceed with hospice, and to initiate some comfort measures during hospital stay while continuing measures like antibiotics, oxygen, he [facility], he is currently comfort care, as needed morphine has been ordered.  Code status:   Code Status: Limited: Do not attempt resuscitation (DNR) -DNR-LIMITED -Do Not Intubate/DNI    Disposition: [facility]  Time spent for the meeting: 25 minutes    Huey Bienenstock, MD  10/17/2023, 1:41 PM

## 2023-10-17 NOTE — Evaluation (Addendum)
Occupational Therapy Evaluation Patient Details Name: Douglas Walker MRN: [id number] DOB: 1925-07-23 Today's Date: 10/17/2023   History of Present Illness 89 yo male presents to [facility] on 10/16/2023 with SOB. Pt recently discharged on 10/7 after management of PNA. Subsequently admitted 10/8-10/16 with sepsis secondary to PNA. PMH includes AMS with tremors, dementia, hypothyroidism, HTN, macular degeneration, UTIs with indwelling catheter, dysphagia - post stroke, CHF, CKD, PPM.   Clinical Impression   Pt admitted for above, on this IE he was hard to arouse but more alert once sitting EOB. He demonstrated very poor sitting balance needing Total A and needs significant assist for ADLs at this time. He remains confused and is only oriented to self, needs +2 assist for EOB/OOB. Pt would benefit from continued acute skilled OT services to  address deficits and help transition to next level of care. Patient would benefit from post acute skilled rehab facility with <3 hours of therapy and 24/7 support        If plan is discharge home, recommend the following: A lot of help with bathing/dressing/bathroom;Assistance with cooking/housework;Direct supervision/assist for medications management;Direct supervision/assist for financial management;Assist for transportation;Help with stairs or ramp for entrance;Supervision due to cognitive status;Two people to help with walking and/or transfers;Assistance with feeding    Functional Status Assessment  Patient has had a recent decline in their functional status and demonstrates the ability to make significant improvements in function in a reasonable and predictable amount of time.  Equipment Recommendations  None recommended by OT (defer)    Recommendations for Other Services       Precautions / Restrictions Precautions Precautions: Fall Precaution Comments: watch O2, venturi mask Restrictions Weight Bearing Restrictions: No      Mobility Bed  Mobility Overal bed mobility: Needs Assistance Bed Mobility: Supine to Sit, Sit to Supine     Supine to sit: +2 for physical assistance, Total assist Sit to supine: Total assist, +2 for physical assistance   General bed mobility comments: Pt with little initiation in BLEs    Transfers                   General transfer comment: deferred      Balance Overall balance assessment: Needs assistance   Sitting balance-Leahy Scale: Zero Sitting balance - Comments: Total A to maintain static sitting balance Postural control: Right lateral lean                                 ADL either performed or assessed with clinical judgement   ADL Overall ADL's : Needs assistance/impaired Eating/Feeding: Total assistance   Grooming: Total assistance   Upper Body Bathing: Total assistance   Lower Body Bathing: Total assistance   Upper Body Dressing : Total assistance   Lower Body Dressing: Total assistance   Toilet Transfer: Total assistance   Toileting- Clothing Manipulation and Hygiene: Total assistance   Tub/ Shower Transfer: Total assistance     General ADL Comments: Pt partially alert, needs significant assist for bed mobility and balance. Not tolerable to much at this time     Vision         Perception         Praxis         Pertinent Vitals/Pain Pain Assessment Pain Assessment: PAINAD Breathing: normal Negative Vocalization: none Facial Expression: smiling or inexpressive Body Language: relaxed Consolability: no need to console PAINAD Score: 0  Extremity/Trunk Assessment Upper Extremity Assessment Upper Extremity Assessment: Difficult to assess due to impaired cognition   Lower Extremity Assessment Lower Extremity Assessment: Difficult to assess due to impaired cognition       Communication Communication Communication: Other (comment) (hard to assess due to cognition)   Cognition Arousal: Lethargic Behavior During Therapy:  WFL for tasks assessed/performed Overall Cognitive Status: History of cognitive impairments - at baseline                                 General Comments: more alert once sitting EOB, intially needed a good amount of effort to arouse. A&O self only. Notes state hx of dementia     General Comments  Sp02 90-95% on 10L venturi mask    Exercises     Shoulder Instructions      Home Living Family/patient expects to be discharged to:: Unsure Living Arrangements: Children Available Help at Discharge: Available 24 hours/day;Family Type of Home: House Home Access: Level entry     Home Layout: One level     Bathroom Shower/Tub: Producer, television/film/video: Handicapped height Bathroom Accessibility: Yes How Accessible: Accessible via walker Home Equipment: Rolling Walker (2 wheels);Grab bars - tub/shower;Shower seat   Additional Comments: lives with son, DIL, grandson.  Per chart review, family states patient will accept any and all help offered to him.      Prior Functioning/Environment Prior Level of Function : Independent/Modified Independent;History of Falls (last six months)             Mobility Comments: Used RW at [employer] [facility] ADLs Comments: son assists with medications, and lays out his clothes, pt otherwise Independent to [place] with ADLs at baseline        OT Problem List: Cardiopulmonary status limiting activity;Impaired balance (sitting and/or standing);Decreased activity tolerance      OT Treatment/Interventions: Self-care/ADL training;Therapeutic exercise;DME and/or AE instruction;Therapeutic activities;Patient/family education;Balance training    OT Goals(Current goals can be found in the care plan section) Acute Rehab OT Goals OT Goal Formulation: Patient unable to participate in goal setting Time For Goal Achievement: 10/31/23 Potential to Achieve Goals: Fair ADL Goals Pt Will Perform Grooming: bed level;with set-up;with  supervision Additional ADL Goal #1: Pt will complete bed mobility with Min A in preparation for seated ADLs Additional ADL Goal #2: Pt will demonstrate independent use of energy conservation techniques prn to regulate Sp02 above 93%  OT Frequency: Min 1X/week    Co-evaluation PT/OT/SLP Co-Evaluation/Treatment: Yes Reason for Co-Treatment: Complexity of the patient's impairments (multi-system involvement);Necessary to address cognition/behavior during functional activity PT goals addressed during session: Mobility/safety with mobility OT goals addressed during session: ADL's and self-care      AM-PAC OT "6 Clicks" Daily Activity     Outcome Measure Help from another person eating meals?: Total Help from another person taking care of personal grooming?: Total Help from another person toileting, which includes using toliet, bedpan, or urinal?: Total Help from another person bathing (including washing, rinsing, drying)?: Total Help from another person to put on and taking off regular upper body clothing?: Total Help from another person to put on and taking off regular lower body clothing?: Total 6 Click Score: 6   End of Session Equipment Utilized During Treatment: Oxygen (10L Venturi) Nurse Communication: Mobility status  Activity Tolerance: Patient limited by fatigue Patient left: in bed;with call bell/phone within reach;with bed alarm set  OT Visit Diagnosis: Unsteadiness  on feet (R26.81);Other (comment);Other symptoms and signs involving cognitive function (SOB)                Time: 9528-4132 OT Time Calculation (min): 15 min Charges:  OT General Charges $OT Visit: 1 Visit OT Evaluation $OT Eval Moderate Complexity: 1 Mod  10/17/2023  AB, OTR/L  Acute Rehabilitation Services  Office: [phone]   Tristan Schroeder 10/17/2023, 10:08 AM

## 2023-10-17 NOTE — Progress Notes (Signed)
Pharmacy Antibiotic Note  Douglas Walker is a 89 y.o. male admitted on 10/16/2023 with pneumonia.  Pharmacy has been consulted for Vancomycin dosing.  D/t his CKD, we will change vanc to linezolid. The vanc load will last until tomorrow so we will start linezolid then.   SCr 2 (baseline SCr ~1.2-1.3)  Plan: Vancomycin>linezolid 600mg  IV q12 Continue Zosyn 3.375g IV q8h (4 hour infusion) per MD F/u MRSA PCR   Height: 5\' 9"  (175.3 cm) Weight: 78 kg (171 lb 15.3 oz) IBW/kg (Calculated) : 70.7  Temp (24hrs), Avg:99.1 F (37.3 C), Min:98.1 F (36.7 C), Max:99.7 F (37.6 C)  Recent Labs  Lab 10/14/23 0821 10/16/23 0443 10/16/23 0453 10/16/23 0707 10/17/23 0305  WBC 7.5 15.5*  --   --  20.5*  CREATININE 1.49* 1.57*  --   --  2.04*  LATICACIDVEN  --   --  2.2* 1.3  --     Estimated Creatinine Clearance: 21.2 mL/min (A) (by C-G formula based on SCr of 2.04 mg/dL (H)).    Allergies  Allergen Reactions   Sulfa Antibiotics Other (See Comments)    Weakness Dizziness  Sweats    Mysoline [Primidone] Other (See Comments)    Sedation    Zocor [Simvastatin] Other (See Comments)    Arthralgias Fatigue    Antimicrobials this admission: Zosyn 10/17 >>  Vancomycin 10/17 x1 Linezolid 10/19>>  Dose adjustments this admission: N/A  Microbiology results: 10/17 BCx: sent 10/17 UCx: sent  10/17 Sputum: sent  10/17 MRSA PCR: sent   Ulyses Southward, PharmD, BCIDP, AAHIVP, CPP Infectious Disease Pharmacist 10/17/2023 10:36 AM

## 2023-10-17 NOTE — Progress Notes (Addendum)
PHARMACY - PHYSICIAN COMMUNICATION CRITICAL VALUE ALERT - BLOOD CULTURE IDENTIFICATION (BCID)  Douglas Walker is an 89 y.o. male who presented to [facility] on 10/16/2023 with a chief complaint of shortness of breath   Assessment:  89 yo male presenting with SOB 2/2 pneumonia and possible catheter related UTI. Currently being treated with linezolid and zosyn.   Name of physician (or Provider) Contacted: Dr. Randol Kern  Current antibiotics: Linezolid and zosyn   Changes to prescribed antibiotics recommended:  Patient is on recommended antibiotics - No changes needed  Results for orders placed or performed during the hospital encounter of 10/16/23  Blood Culture ID Panel (Reflexed) (Collected: 10/16/2023  4:43 AM)  Result Value Ref Range   Enterococcus faecalis NOT DETECTED NOT DETECTED   Enterococcus Faecium NOT DETECTED NOT DETECTED   Listeria monocytogenes NOT DETECTED NOT DETECTED   Staphylococcus species DETECTED (A) NOT DETECTED   Staphylococcus aureus (BCID) NOT DETECTED NOT DETECTED   Staphylococcus epidermidis DETECTED (A) NOT DETECTED   Staphylococcus lugdunensis NOT DETECTED NOT DETECTED   Streptococcus species NOT DETECTED NOT DETECTED   Streptococcus agalactiae NOT DETECTED NOT DETECTED   Streptococcus pneumoniae NOT DETECTED NOT DETECTED   Streptococcus pyogenes NOT DETECTED NOT DETECTED   A.calcoaceticus-baumannii NOT DETECTED NOT DETECTED   Bacteroides fragilis NOT DETECTED NOT DETECTED   Enterobacterales NOT DETECTED NOT DETECTED   Enterobacter cloacae complex NOT DETECTED NOT DETECTED   Escherichia coli NOT DETECTED NOT DETECTED   Klebsiella aerogenes NOT DETECTED NOT DETECTED   Klebsiella oxytoca NOT DETECTED NOT DETECTED   Klebsiella pneumoniae NOT DETECTED NOT DETECTED   Proteus species NOT DETECTED NOT DETECTED   Salmonella species NOT DETECTED NOT DETECTED   Serratia marcescens NOT DETECTED NOT DETECTED   Haemophilus influenzae NOT DETECTED NOT DETECTED    Neisseria meningitidis NOT DETECTED NOT DETECTED   Pseudomonas aeruginosa NOT DETECTED NOT DETECTED   Stenotrophomonas maltophilia NOT DETECTED NOT DETECTED   Candida albicans NOT DETECTED NOT DETECTED   Candida auris NOT DETECTED NOT DETECTED   Candida glabrata NOT DETECTED NOT DETECTED   Candida krusei NOT DETECTED NOT DETECTED   Candida parapsilosis NOT DETECTED NOT DETECTED   Candida tropicalis NOT DETECTED NOT DETECTED   Cryptococcus neoformans/gattii NOT DETECTED NOT DETECTED   Methicillin resistance mecA/C DETECTED (A) NOT DETECTED    Douglas Walker 10/17/2023  3:19 PM

## 2023-10-17 NOTE — TOC Initial Note (Signed)
Transition of Care [facility]) - Initial/Assessment Note    Patient Details  Name: Douglas Walker MRN: [id number] Date of Birth: May 06, 1925  Transition of Care [facility]) CM/SW Contact:    Mearl Latin, LCSW Phone Number: 10/17/2023, 1:59 PM  Clinical Narrative:                 CSW spoke with patient's son, Brett Canales, as requested. He shared patient's illness progression and has decided to move forward with Hospice facility placement. He is requesting [facility] as first choice and then any of the other facilities that are not too far away if [facility] cannot accept patient. CSW discussed with MD and sent referral to [facility] for review.     Expected Discharge Plan: Hospice Medical Facility Barriers to Discharge: Hospice Bed not available, Continued Medical Work up   Patient Goals and CMS Choice Patient states their goals for this hospitalization and ongoing recovery are:: Comfort CMS Medicare.gov Compare Post Acute Care list provided to:: Patient Represenative (must comment) Choice offered to / list presented to : Adult Children      Expected Discharge Plan and Services In-house Referral: Clinical Social Work   Post Acute Care Choice: Residential Hospice Bed Living arrangements for the past 2 months: Single Family Home                                      Prior Living Arrangements/Services Living arrangements for the past 2 months: Single Family Home Lives with:: Adult Children Patient language and need for interpreter reviewed:: Yes Do you feel safe going back to the place where you live?: Yes      Need for Family Participation in Patient Care: Yes (Comment) Care giver support system in place?: Yes (comment) Current home services: DME (rolling walker, shower chair, cane, wheelchair, bedside commode, O2) Criminal Activity/Legal Involvement Pertinent to Current Situation/Hospitalization: No - Comment as needed  Activities of Daily Living   ADL Screening (condition at  time of admission) Independently performs ADLs?: No Does the patient have a NEW difficulty with bathing/dressing/toileting/self-feeding that is expected to last >3 days?: Yes (Initiates electronic notice to provider for possible OT consult) Does the patient have a NEW difficulty with getting in/out of bed, walking, or climbing stairs that is expected to last >3 days?: Yes (Initiates electronic notice to provider for possible PT consult) Does the patient have a NEW difficulty with communication that is expected to last >3 days?: No Is the patient deaf or have difficulty hearing?: Yes Does the patient have difficulty seeing, even when wearing glasses/contacts?: Yes Does the patient have difficulty concentrating, remembering, or making decisions?: Yes  Permission Sought/Granted Permission sought to share information with : Facility Medical sales representative, Family Supports Permission granted to share information with : No     Permission granted to share info w AGENCY: Hospice  Permission granted to share info w Relationship: Son     Emotional Assessment Appearance:: Appears stated age Attitude/Demeanor/Rapport: Unable to Assess Affect (typically observed): Unable to Assess Orientation: : Oriented to Self Alcohol / Substance Use: Not Applicable Psych Involvement: No (comment)  Admission diagnosis:  Severe sepsis (HCC) [A41.9, R65.20] Sepsis, due to unspecified organism, unspecified whether acute organ dysfunction present [facility]) [A41.9] Patient Active Problem List   Diagnosis Date Noted   Severe sepsis (HCC) 10/16/2023   Heart failure with mildly reduced ejection fraction (HCC) 10/16/2023   Pancytopenia (HCC) 10/07/2023  Chronic pain 10/07/2023   Catheter-associated urinary tract infection (HCC) 10/06/2023   Dementia (HCC) 10/04/2023   Pressure ulcer 10/04/2023   Acute respiratory failure with hypoxia (HCC) 10/03/2023   Sepsis due to pneumonia (HCC) 10/03/2023   Chronic heart failure  with preserved ejection fraction (HCC) 10/03/2023   Aneurysm of ascending aorta without rupture (HCC) 02/04/2023   Coronary artery calcification 02/04/2023   Aortic atherosclerosis (HCC) 02/04/2023   Hematuria 01/02/2023   FTT (failure to thrive) in adult 01/02/2023   Nausea and vomiting 10/13/2022   Normocytic anemia 10/13/2022   AAA (abdominal aortic aneurysm) (HCC) 10/13/2022   Physical deconditioning 08/29/2022   Eye pain, right 08/10/2022   Blind painful right eye 08/08/2022   Vitreous hemorrhage of right eye (HCC) 08/05/2022   Choroidal hemorrhage, right 08/05/2022   Secondary glaucoma due to combination mechanisms, right, moderate stage 08/05/2022   History of corneal transplant 05/16/2022   Chronic indwelling Foley catheter 01/31/2022   Benign prostatic hyperplasia with lower urinary tract symptoms 01/31/2022   Medication monitoring encounter 01/31/2022   Multiple drug resistant organism (MDRO) culture positive 01/31/2022   Bacteremia due to Proteus species 10/11/2021   Acute kidney injury superimposed on chronic kidney disease (HCC) 07/06/2021   Pain in both testicles    Right hemiparesis (HCC) 04/23/2021   Cerebrovascular accident (CVA) due to embolism of left middle cerebral artery (HCC) 04/23/2021   Neuropathy 04/23/2021   Abnormality of gait 04/23/2021   Abnormal results of thyroid function studies 04/04/2021   Acquired iron deficiency anemia due to decreased absorption 04/04/2021   Acquired thrombophilia (HCC) 04/04/2021   Cerebral atherosclerosis 04/04/2021   Basal cell carcinoma of nose 04/04/2021   Benign hypertensive heart and renal disease, with heart and renal failure (HCC) 04/04/2021   Benign prostatic hyperplasia 04/04/2021   Obesity 04/04/2021   Closed fracture of one rib 04/04/2021   Constipation by delayed colonic transit 04/04/2021   Generalized weakness 04/04/2021   Gastro-esophageal reflux disease without esophagitis 04/04/2021   Hypothyroidism  04/04/2021   Left lower quadrant pain 04/04/2021   Lumbar radiculopathy 04/04/2021   Reactive depression 04/04/2021   Mass of chest wall 04/04/2021   Mixed hyperlipidemia 04/04/2021   Nocturia 04/04/2021   Other shoulder lesions, right shoulder 04/04/2021   Pain in limb 04/04/2021   Peripheral edema 04/04/2021   Personal history of malignant neoplasm of bladder 04/04/2021   Pruritus of genitalia 04/04/2021   Tremor 04/04/2021   Unilateral primary osteoarthritis, left knee 04/04/2021   Unspecified mononeuropathy of unspecified lower limb 04/04/2021   Urinary incontinence 04/04/2021   Dysphagia, post-stroke    Chronic diastolic congestive heart failure (HCC)    Chronic pain syndrome    Left middle cerebral artery stroke (HCC) 02/01/2021   Pacemaker 08/30/2020   Hematoma, chest wall 07/24/2020   Exudative age-related macular degeneration of right eye with inactive choroidal neovascularization (HCC) 05/10/2020   Exudative age-related macular degeneration of left eye with inactive choroidal neovascularization (HCC) 05/10/2020   Retinal hemorrhage of right eye 05/10/2020   Exposure keratopathy, bilateral 05/10/2020   Bilateral dry eyes 05/10/2020   Advanced nonexudative age-related macular degeneration of both eyes with subfoveal involvement 05/10/2020   Complete heart block (HCC) 12/04/2018   Chronic low back pain 12/15/2017   Hereditary and idiopathic peripheral neuropathy 07/01/2016   Essential tremor 02/28/2016   BPPV (benign paroxysmal positional vertigo) 06/19/2015   Chronic kidney disease, stage III (moderate) (HCC) 06/18/2015   Chronic diastolic CHF (congestive heart failure) (HCC) 05/30/2015  Fall    Hypotension 05/19/2015   Fracture of rib of left side 05/19/2015   Mobitz type II atrioventricular block 09/13/2014   Essential hypertension 09/13/2014   Chronic anticoagulation 09/13/2014   SSS (sick sinus syndrome) (HCC) 09/13/2014   Paroxysmal atrial fibrillation (HCC)  10/28/2013   Thrombocytopenia (HCC) 04/06/2013   PCP:  Georgann Housekeeper, MD Pharmacy:   [facility] [address], [place] - [address] [place] [address] Phone: [phone] Fax: [phone]  [facility] [address] [place] [address] Phone: [phone] Fax: [phone]     Social Determinants of Health (SDOH) Social History: SDOH Screenings   Food Insecurity: No Food Insecurity (10/16/2023)  Housing: Low Risk  (10/16/2023)  Transportation Needs: No Transportation Needs (10/16/2023)  Utilities: Not At Risk (10/16/2023)  Depression (PHQ2-9): Low Risk  (07/29/2023)  Tobacco Use: Medium Risk (10/16/2023)   SDOH Interventions:     Readmission Risk Interventions    10/17/2023    1:56 PM 10/10/2023   10:25 AM 10/06/2023   12:21 PM  Readmission Risk Prevention Plan  Transportation Screening Complete Complete Complete  Medication Review (RN Care Manager) Complete Referral to Pharmacy Complete  PCP or Specialist appointment within 3-5 days of discharge Complete Complete Complete  HRI or Home Care Consult Complete Complete Complete  SW Recovery Care/Counseling Consult Complete Complete   Palliative Care Screening Complete Not Applicable   Skilled Nursing Facility Patient Refused Complete Not Applicable

## 2023-10-17 NOTE — Plan of Care (Signed)
  Problem: Activity: Goal: Ability to tolerate increased activity will improve Outcome: Progressing   Problem: Clinical Measurements: Goal: Ability to maintain a body temperature in the normal range will improve Outcome: Progressing   Problem: Respiratory: Goal: Ability to maintain adequate ventilation will improve Outcome: Progressing Goal: Ability to maintain a clear airway will improve Outcome: Progressing   Problem: Education: Goal: Understanding of post-operative needs will improve Outcome: Progressing Goal: Individualized Educational Video(s) Outcome: Progressing   Problem: Clinical Measurements: Goal: Postoperative complications will be avoided or minimized Outcome: Progressing   Problem: Respiratory: Goal: Will regain and/or maintain adequate ventilation Outcome: Progressing   Problem: Education: Goal: Knowledge of General Education information will improve Description: Including pain rating scale, medication(s)/side effects and non-pharmacologic comfort measures Outcome: Progressing   Problem: Health Behavior/Discharge Planning: Goal: Ability to manage health-related needs will improve Outcome: Progressing   Problem: Clinical Measurements: Goal: Ability to maintain clinical measurements within normal limits will improve Outcome: Progressing Goal: Will remain free from infection Outcome: Progressing Goal: Diagnostic test results will improve Outcome: Progressing Goal: Respiratory complications will improve Outcome: Progressing Goal: Cardiovascular complication will be avoided Outcome: Progressing   Problem: Activity: Goal: Risk for activity intolerance will decrease Outcome: Progressing   Problem: Nutrition: Goal: Adequate nutrition will be maintained Outcome: Progressing   Problem: Coping: Goal: Level of anxiety will decrease Outcome: Progressing   Problem: Elimination: Goal: Will not experience complications related to bowel motility Outcome:  Progressing Goal: Will not experience complications related to urinary retention Outcome: Progressing   Problem: Pain Managment: Goal: General experience of comfort will improve Outcome: Progressing   Problem: Safety: Goal: Ability to remain free from injury will improve Outcome: Progressing   Problem: Skin Integrity: Goal: Risk for impaired skin integrity will decrease Outcome: Progressing

## 2023-10-17 NOTE — Evaluation (Signed)
Physical Therapy Evaluation Patient Details Name: Douglas Walker MRN: [id number] DOB: Jan 23, 1925 Today's Date: 10/17/2023  History of Present Illness  Patient is a 89 year old male coming from home with shortness of breath. Medical history significant of PAF,, HFpEF (EF 50-55%), tachy-brady syndrome s/p PPM, CKD, anemia and thrombocytopenia, HTN, HLD, hypothyroidism, essential tremor, dementia, hard of hearing, recurrent UTI, BPH with chronic indwelling Foley catheter. Two recent hospital stays with penumonia.  Clinical Impression  PT evaluation completed. Patient unable to provide history but was ambulatory with assistance using rolling walker in the hallway with prior admission. He lives with son per chart. No family at the bedside today.  The patient has dyspnea at rest and with exertion today. No desaturation with seated level level activity with Sp02 in the 90's throughout session. Patient required extensive assistance for sitting up on edge of bed and is unable to maintain sitting balance without physical assistance. Patient is fatigued with minimal activity. PT will continue to follow to maximize independence and decrease caregiver burden. Anticipate patient will required physical assistance if going home. Consider rehabilitation < 3 hours/day after this hospital stay pending patient/family goals.       If plan is discharge home, recommend the following: Two people to help with walking and/or transfers;A lot of help with bathing/dressing/bathroom;Assistance with cooking/housework;Help with stairs or ramp for entrance;Supervision due to cognitive status;Assist for transportation   Can travel by private vehicle   No    Equipment Recommendations Other (comment) (to be determined at next level of care)  Recommendations for Other Services       Functional Status Assessment Patient has had a recent decline in their functional status and demonstrates the ability to make significant  improvements in function in a reasonable and predictable amount of time.     Precautions / Restrictions Precautions Precautions: Fall Precaution Comments: watch O2, venturi mask Restrictions Weight Bearing Restrictions: No      Mobility  Bed Mobility Overal bed mobility: Needs Assistance Bed Mobility: Supine to Sit, Sit to Supine     Supine to sit: Total assist, +2 for physical assistance Sit to supine: Total assist, +2 for physical assistance   General bed mobility comments: poor initiation with movement despite cues    Transfers                   General transfer comment: unsafe to attempt due to dyspnea and fatigue with minimal activity    Ambulation/Gait                  Stairs            Wheelchair Mobility     Tilt Bed    Modified Rankin (Stroke Patients Only)       Balance Overall balance assessment: Needs assistance Sitting-balance support: No upper extremity supported, Feet supported Sitting balance-Leahy Scale: Zero Sitting balance - Comments: Total A to maintain static sitting balance Postural control: Right lateral lean                                   Pertinent Vitals/Pain Pain Assessment Pain Assessment: No/denies pain    Home Living Family/patient expects to be discharged to:: Private residence Living Arrangements: Children Available Help at Discharge: Available 24 hours/day;Family Type of Home: House Home Access: Level entry       Home Layout: One level Home Equipment: Agricultural consultant (2 wheels);Grab bars -  tub/shower;Shower seat Additional Comments: liinformation is per prior hospital stay. patient unable to provide information at this time due to mental status    Prior Function Prior Level of Function : Independent/Modified Independent;History of Falls (last six months)             Mobility Comments: ambulation using RW ADLs Comments: assistance from family     Extremity/Trunk  Assessment   Upper Extremity Assessment Upper Extremity Assessment: Difficult to assess due to impaired cognition    Lower Extremity Assessment Lower Extremity Assessment: Difficult to assess due to impaired cognition (patient appears generally weak)       Communication   Communication Communication: Hearing impairment;Difficulty communicating thoughts/reduced clarity of speech;Difficulty following commands/understanding Following commands: Follows one step commands inconsistently Cueing Techniques: Verbal cues;Tactile cues;Visual cues  Cognition Arousal: Lethargic Behavior During Therapy: WFL for tasks assessed/performed Overall Cognitive Status: History of cognitive impairments - at baseline                                 General Comments: very HOH. patient required increased time for command following. increased alertness with mobility        General Comments General comments (skin integrity, edema, etc.): on desaturation noted with seated level activity. Sp02 in the 90's in venti mask on 10L02. discussed with respiratory therapy prior to evaluation    Exercises     Assessment/Plan    PT Assessment Patient needs continued PT services  PT Problem List Decreased strength;Decreased range of motion;Decreased activity tolerance;Decreased balance;Decreased mobility;Decreased cognition;Decreased knowledge of use of DME;Decreased safety awareness;Cardiopulmonary status limiting activity       PT Treatment Interventions DME instruction;Gait training;Stair training;Functional mobility training;Therapeutic activities;Therapeutic exercise;Balance training;Neuromuscular re-education;Cognitive remediation;Patient/family education;Wheelchair mobility training    PT Goals (Current goals can be found in the Care Plan section)  Acute Rehab PT Goals Patient Stated Goal: patient unable to participate with goal setting PT Goal Formulation: Patient unable to participate in goal  setting Time For Goal Achievement: 10/24/23 Potential to Achieve Goals: Poor    Frequency Min 1X/week     Co-evaluation PT/OT/SLP Co-Evaluation/Treatment: Yes Reason for Co-Treatment: Complexity of the patient's impairments (multi-system involvement);Necessary to address cognition/behavior during functional activity PT goals addressed during session: Mobility/safety with mobility OT goals addressed during session: ADL's and self-care       AM-PAC PT "6 Clicks" Mobility  Outcome Measure Help needed turning from your back to your side while in a flat bed without using bedrails?: A Lot Help needed moving from lying on your back to sitting on the side of a flat bed without using bedrails?: Total Help needed moving to and from a bed to a chair (including a wheelchair)?: Total Help needed standing up from a chair using your arms (e.g., wheelchair or bedside chair)?: Total Help needed to walk in hospital room?: Total Help needed climbing 3-5 steps with a railing? : Total 6 Click Score: 7    End of Session Equipment Utilized During Treatment: Oxygen Activity Tolerance: Patient limited by fatigue;Patient limited by lethargy Patient left: in bed;with call bell/phone within reach;with bed alarm set Nurse Communication: Mobility status PT Visit Diagnosis: Unsteadiness on feet (R26.81);Muscle weakness (generalized) (M62.81)    Time: 2841-3244 PT Time Calculation (min) (ACUTE ONLY): 15 min   Charges:   PT Evaluation $PT Eval Moderate Complexity: 1 Mod   PT General Charges $$ ACUTE PT VISIT: 1 Visit  Donna Bernard, PT, MPT   Ina Homes 10/17/2023, 11:16 AM

## 2023-10-17 NOTE — Progress Notes (Signed)
[facility] Liaison Note  Received request from [place], Transitions of Care Manager, for hospice services at inpatient hospice facility after discharge. Spoke with son, Brett Canales to initiate education related to hospice philosophy, services, and team approach to care, including inpatient hospice facility care and expectations. Family verbalized understanding of information given.   Patient is approved for inpatient hospice facility by Dr. Alphonsus Sias, Hospice Provider. Currently there is no bed available at either [facility] or [facility]. Will continue to monitor for availability.  [facility] information and contact numbers given to [place]. Above information shared with Osborne Casco, Transitions of Care Manager. Please call with any questions or concerns.   Thank you for the opportunity to participate in this patient's care.   Glenna Fellows BSN, Charity fundraiser, OCN [employer] [phone]

## 2023-10-17 NOTE — Progress Notes (Signed)
PROGRESS NOTE    Douglas Walker  ZOX:[id number] DOB: 01-29-1925 DOA: 10/16/2023 PCP: Georgann Housekeeper, MD    Chief Complaint  Patient presents with   Shortness of Breath    Brief Narrative:   Douglas Walker is a 89 y.o. male with medical history significant of PAF on Eliquis, HFpEF (EF 50-55%), tachy-brady syndrome s/p PPM, CKD stage IIIa, chronic anemia and thrombocytopenia, HTN, HLD, hypothyroidism, essential tremor, dementia, hard of hearing, recurrent UTI, BPH with chronic indwelling Foley catheter who presents back to the hospital with shortness of breath.  Patient with multiple hospitalization over the last few weeks, secondary to respiratory failure, aspiration pneumonia, he was just discharged 10/15/2023, he developed dyspnea, he was noted to be septic in ED, with hypoxia, with concern for aspiration pneumonia as well.   Assessment & Plan:   Active Problems:   Acute respiratory failure with hypoxia (HCC)   Sepsis due to pneumonia (HCC)   Catheter-associated urinary tract infection (HCC)   Heart failure with mildly reduced ejection fraction (HCC)   Paroxysmal atrial fibrillation (HCC)   SSS (sick sinus syndrome) (HCC)   Pacemaker   Dysphagia, post-stroke   Chronic kidney disease, stage III (moderate) (HCC)   Normocytic anemia   Dementia (HCC)   Hypothyroidism   Generalized weakness   Acute respiratory failure with hypoxia   Sepsis POA  secondary to pneumonia Patient presented with cough and shortness of breath.  Hypoxic 55% on 4 L initial O2 saturations. -Significant respiratory distress, requiring BiPAP initially, he is currently on Ventimask -Chest x-ray reviewed, showing extensive bilateral opacities concerning for multifocal-aspiration pneumonia. -He is on broad-spectrum antibiotic, including IV vancomycin and Zosyn, will transition vancomycin to Zyvox in the setting of CKD) -Continue with BiPAP as needed, but will try on vent T versus heated high flow for  now. -Please see discussion below under goals of care.   Possible catheter associated urinary tract infection Prior to arrival.  Urinalysis again positive for moderate hemoglobin, large leukocytes, many bacteria, greater than 50 WBCs, and greater than 50 RBCs/hpf.  Urine cultures from 10/4 had grown out Proteus mirabilis and Morganella Morganii that were sensitive to Zosyn.  Urine culture from 10/8 noted insignificant growth.  Suspect likely colonization. -Recheck urine culture -Continue Zosyn -Consider need to formally consult ID once cultures result   Acute on chronic heart failure with mildly reduced EF -With evidence of mild volume overload -Diurese as tolerated, this is limiting in the setting of his sepsis -Dose Lasix as needed, will hold on IV Lasix today. -  Chest x-ray also noted small to moderate left-sided pleural effusion.  Last echocardiogram noted EF to be 50 to 55%. -Strict I/O's and daily weights  Paroxysmal atrial fibrillation on chronic anticoagulation Sick sinus syndrome s/p permanent pacemaker -Patient is not on any AV nodal blocking agents. -Resume Eliquis once able   Dysphagia -Appears to be progressive problem plan evolving over last few weeks, this is his third admission this month secondary to aspiration -Now he will be kept n.p.o., as discussed with SLP, who will possibly need formal evaluation if his mentation and proves or he becomes more stable, but otherwise please see discussion below under goals of care..   Chronic kidney disease stage IIIb Creatinine 1.57 with BUN 25 which appears near patient's baseline creatinine.  Normocytic anemia Chronic.  Hemoglobin 10.9 g/dL which appears improved from prior.   Dementia Patient has very hard of hearing -Delirium precautions   Hypothyroidism -Continue levothyroxine   Goals of  care discussion I have discussed with son at length this morning, he understand overall significant deterioration over last few  weeks, and patient at high risk for recurrent aspiration, continue with current measures with no escalation of care, current measure includes antibiotics, oxygenation, will have palliative medicine involved to assist with goals of care and symptom management if he fails to respond to current measures, son report he understands patient's facility, deterioration, and he reports he had a good life, and patient never wished for aggressive measures including intubations, or artificial feeding.  DVT prophylaxis: Lovenox(liquids remains on hold as unsafe to swallow) Code Status: DNR Family Communication: D/W son by phone Disposition:   Status is: Inpatient    Consultants:  palliaive   Subjective:  This nonverbal, unable to provide any complaints, he was transitioned from BiPAP to Ventimask yesterday  Objective: Vitals:   10/17/23 0400 10/17/23 0500 10/17/23 0600 10/17/23 0834  BP: (!) 145/46   (!) 150/63  Pulse: 69 69 68 71  Resp: 20   20  Temp: 99.7 F (37.6 C)   99.2 F (37.3 C)  TempSrc: Oral   Axillary  SpO2: 95% (!) 86% 91% 96%  Weight:      Height:        Intake/Output Summary (Last 24 hours) at 10/17/2023 1030 Last data filed at 10/17/2023 0200 Gross per 24 hour  Intake 16.84 ml  Output 700 ml  Net -683.16 ml   Filed Weights   10/16/23 0634  Weight: 78 kg    Examination:  Somnolent, but open eyes, not following commands or answer any questions, ill-appearing Symmetrical Chest wall movement, diminished air entry bilaterally, tachypneic with scattered Rales and rhonchi RRR,No Gallops,Rubs or new Murmurs, No Parasternal Heave +ve B.Sounds, Abd Soft, No tenderness, No rebound - guarding or rigidity. No Cyanosis, Clubbing or edema, No new Rash or bruise      Data Reviewed: I have personally reviewed following labs and imaging studies  CBC: Recent Labs  Lab 10/14/23 0821 10/16/23 0443 10/16/23 0452 10/17/23 0305  WBC 7.5 15.5*  --  20.5*  NEUTROABS 5.0 13.6*   --   --   HGB 9.7* 10.9* 11.6* 9.2*  HCT 29.9* 33.5* 34.0* 28.2*  MCV 92.0 91.5  --  92.8  PLT 190 227  --  173    Basic Metabolic Panel: Recent Labs  Lab 10/14/23 0821 10/16/23 0443 10/16/23 0452 10/17/23 0305  NA 137 138 138 139  K 3.6 3.7 3.3* 3.8  CL 96* 95*  --  99  CO2 32 26  --  28  GLUCOSE 118* 111*  --  105*  BUN 18 25*  --  34*  CREATININE 1.49* 1.57*  --  2.04*  CALCIUM 9.3 10.4*  --  9.6    GFR: Estimated Creatinine Clearance: 21.2 mL/min (A) (by C-G formula based on SCr of 2.04 mg/dL (H)).  Liver Function Tests: Recent Labs  Lab 10/16/23 0443 10/17/23 0305  AST 23 25  ALT 16 13  ALKPHOS 96 78  BILITOT 0.7 1.3*  PROT 8.5* 7.2  ALBUMIN 2.9* 2.3*    CBG: No results for input(s): "GLUCAP" in the last 168 hours.   Recent Results (from the past 240 hour(s))  Resp panel by RT-PCR (RSV, Flu A&B, Covid) Anterior Nasal Swab     Status: None   Collection Time: 10/16/23  4:43 AM   Specimen: Anterior Nasal Swab  Result Value Ref Range Status   SARS Coronavirus 2 by RT PCR NEGATIVE  NEGATIVE Final   Influenza A by PCR NEGATIVE NEGATIVE Final   Influenza B by PCR NEGATIVE NEGATIVE Final    Comment: (NOTE) The Xpert Xpress SARS-CoV-2/FLU/RSV plus assay is intended as an aid in the diagnosis of influenza from Nasopharyngeal swab specimens and should not be used as a sole basis for treatment. Nasal washings and aspirates are unacceptable for Xpert Xpress SARS-CoV-2/FLU/RSV testing.  Fact Sheet for Patients: BloggerCourse.com  Fact Sheet for Healthcare Providers: SeriousBroker.it  This test is not yet approved or cleared by the [place] FDA and has been authorized for detection and/or diagnosis of SARS-CoV-2 by FDA under an Emergency Use Authorization (EUA). This EUA will remain in effect (meaning this test can be used) for the duration of the COVID-19 declaration under Section 564(b)(1) of the Act,  21 U.S.C. section 360bbb-3(b)(1), unless the authorization is terminated or revoked.     Resp Syncytial Virus by PCR NEGATIVE NEGATIVE Final    Comment: (NOTE) Fact Sheet for Patients: BloggerCourse.com  Fact Sheet for Healthcare Providers: SeriousBroker.it  This test is not yet approved or cleared by the [place] FDA and has been authorized for detection and/or diagnosis of SARS-CoV-2 by FDA under an Emergency Use Authorization (EUA). This EUA will remain in effect (meaning this test can be used) for the duration of the COVID-19 declaration under Section 564(b)(1) of the Act, 21 U.S.C. section 360bbb-3(b)(1), unless the authorization is terminated or revoked.  Performed at [facility], [address] [place], [place] [address]   Blood Culture (routine x 2)     Status: None (Preliminary result)   Collection Time: 10/16/23  4:43 AM   Specimen: BLOOD  Result Value Ref Range Status   Specimen Description BLOOD SITE NOT SPECIFIED  Final   Special Requests   Final    BOTTLES DRAWN AEROBIC AND ANAEROBIC Blood Culture adequate volume   Culture   Final    NO GROWTH 1 DAY Performed at [facility], [address], [place], [place] [address]    Report Status PENDING  Incomplete  Urine Culture     Status: Abnormal (Preliminary result)   Collection Time: 10/16/23  4:43 AM   Specimen: Urine, Catheterized  Result Value Ref Range Status   Specimen Description URINE, CATHETERIZED  Final   Special Requests   Final    NONE Reflexed from [id number] Performed at [facility], [address], [place], [place] [address]    Culture >=100,000 COLONIES/mL GRAM NEGATIVE RODS (A)  Final   Report Status PENDING  Incomplete  Blood Culture (routine x 2)     Status: None (Preliminary result)   Collection Time: 10/16/23  6:03 AM   Specimen: BLOOD LEFT ARM  Result Value Ref Range Status   Specimen Description BLOOD LEFT ARM   Final   Special Requests   Final    BOTTLES DRAWN AEROBIC AND ANAEROBIC Blood Culture adequate volume   Culture   Final    NO GROWTH 1 DAY Performed at [facility], [address] [place], [place] [address]    Report Status PENDING  Incomplete         Radiology Studies: DG Chest Port 1 View  Result Date: 10/16/2023 CLINICAL DATA:  89 year old male with shortness of breath. EXAM: PORTABLE CHEST 1 VIEW COMPARISON:  Chest x-ray 10/07/2023. FINDINGS: Diffuse interstitial prominence and widespread peribronchial cuffing with patchy multifocal airspace disease noted throughout the lungs bilaterally (left-greater-than-right), with worsened aeration, particularly in the  left lung compared to the prior study. Small to moderate left pleural effusion. No definite right pleural effusion. No pneumothorax. Heart size is normal. Atherosclerotic calcifications in the thoracic aorta. Left-sided pacemaker device in place with lead tips projecting over the expected location of the right ventricle. IMPRESSION: 1. Severe multilobar bilateral bronchopneumonia (left-greater-than-right), worsened in the left lung compared to the prior study. 2. Small to moderate left pleural effusion. 3. Aortic atherosclerosis. Electronically Signed   By: Trudie Reed M.D.   On: 10/16/2023 06:58        Scheduled Meds:  guaiFENesin  600 mg Oral BID   sodium chloride flush  3 mL Intravenous Q12H   Continuous Infusions:  piperacillin-tazobactam (ZOSYN)  IV 3.375 g (10/17/23 0511)   [START ON 10/18/2023] vancomycin       LOS: 1 day       Huey Bienenstock, MD [facility]   To contact the attending provider between 7A-7P or the covering provider during after hours 7P-7A, please log into the web site [email or web address] and access using universal [place] password for that web site. If you do not have the password, please call the hospital operator.  10/17/2023, 10:30 AM

## 2023-10-18 DIAGNOSIS — Z515 Encounter for palliative care: Secondary | ICD-10-CM

## 2023-10-18 LAB — URINE CULTURE: Culture: >=100000 — AB

## 2023-10-18 LAB — CBC
HCT: 27.7 % — ABNORMAL LOW (ref 39.0–52.0)
Hemoglobin: 9 g/dL — ABNORMAL LOW (ref 13.0–17.0)
MCH: 29.6 pg (ref 26.0–34.0)
MCHC: 32.5 g/dL (ref 30.0–36.0)
MCV: 91.1 fL (ref 80.0–100.0)
Platelets: 155 10*3/uL (ref 150–400)
RBC: 3.04 MIL/uL — ABNORMAL LOW (ref 4.22–5.81)
RDW: 16.3 % — ABNORMAL HIGH (ref 11.5–15.5)
WBC: 14.3 10*3/uL — ABNORMAL HIGH (ref 4.0–10.5)
nRBC: 0 % (ref 0.0–0.2)

## 2023-10-18 LAB — BASIC METABOLIC PANEL
Anion gap: 14 (ref 5–15)
BUN: 32 mg/dL — ABNORMAL HIGH (ref 8–23)
CO2: 28 mmol/L (ref 22–32)
Calcium: 10.3 mg/dL (ref 8.9–10.3)
Chloride: 99 mmol/L (ref 98–111)
Creatinine, Ser: 1.64 mg/dL — ABNORMAL HIGH (ref 0.61–1.24)
GFR, Estimated: 38 mL/min — ABNORMAL LOW (ref >60)
Glucose, Bld: 99 mg/dL (ref 70–99)
Potassium: 3.9 mmol/L (ref 3.5–5.1)
Sodium: 141 mmol/L (ref 135–145)

## 2023-10-18 MED ORDER — LORAZEPAM 2 MG/ML IJ SOLN: 1.0000 mg | INTRAMUSCULAR | Status: DC | PRN

## 2023-10-18 MED ORDER — ATROPINE SULFATE 1 % OP SOLN: 4.0000 [drp] | OPHTHALMIC | Status: DC | PRN

## 2023-10-18 MED ORDER — LORAZEPAM 2 MG/ML PO CONC: 1.0000 mg | ORAL | Status: DC | PRN

## 2023-10-18 MED ORDER — POLYVINYL ALCOHOL 1.4 % OP SOLN: 1.0000 [drp] | Freq: Four times a day (QID) | OPHTHALMIC | Status: DC | PRN

## 2023-10-18 MED ORDER — LORAZEPAM 1 MG PO TABS: 1.0000 mg | ORAL_TABLET | ORAL | Status: DC | PRN

## 2023-10-18 NOTE — Progress Notes (Signed)
Morphine given for increased work of breathing and comfort

## 2023-10-18 NOTE — Progress Notes (Signed)
SLP Cancellation Note  Patient Details Name: NEAL FOERST MRN: [id number] DOB: 11/03/1925   Cancelled treatment:       Reason Eval/Treat Not Completed: Other (comment). SLP reviewed chat and discussed via secure message EPIC chat with MD regarding this patient. Plan is for discharge to Hospice facility and patient has been made comfort care. SLP will s/o at this time.    [name], MA, CCC-SLP Speech Therapy

## 2023-10-18 NOTE — Progress Notes (Signed)
PROGRESS NOTE    Douglas Walker  OZH:[id number] DOB: [birth year] DOA: 10/16/2023 PCP: Georgann Housekeeper, MD    Chief Complaint  Patient presents with   Shortness of Breath    Brief Narrative:   Douglas Walker is a 89 y.o. male with medical history significant of PAF on Eliquis, HFpEF (EF 50-55%), tachy-brady syndrome s/p PPM, CKD stage IIIa, chronic anemia and thrombocytopenia, HTN, HLD, hypothyroidism, essential tremor, dementia, hard of hearing, recurrent UTI, BPH with chronic indwelling Foley catheter who presents back to the hospital with shortness of breath.  Patient with multiple hospitalization over the last few weeks, secondary to respiratory failure, aspiration pneumonia, he was just discharged 10/15/2023, he developed dyspnea, he was noted to be septic in ED, with hypoxia, with concern for aspiration pneumonia as well.  Patient with multiple admissions over the last month secondary to aspiration pneumonia, patient at end-of-life which I have discussed with the son, with significant deterioration over the last few weeks, decision has been made to transition for comfort measures.   Assessment & Plan:   Active Problems:   Acute respiratory failure with hypoxia (HCC)   Sepsis due to pneumonia (HCC)   Catheter-associated urinary tract infection (HCC)   Heart failure with mildly reduced ejection fraction (HCC)   Paroxysmal atrial fibrillation (HCC)   SSS (sick sinus syndrome) (HCC)   Pacemaker   Dysphagia, post-stroke   Chronic kidney disease, stage III (moderate) (HCC)   Normocytic anemia   Dementia (HCC)   Hypothyroidism   Generalized weakness   Acute respiratory failure with hypoxia   Sepsis POA  secondary to multifocal aspiration pneumonia catheter associated urinary tract infection Acute on chronic heart failure with mildly reduced EF Paroxysmal atrial fibrillation on chronic anticoagulation Sick sinus syndrome s/p permanent pacemaker Dysphagia Chronic kidney  disease stage IIIb Normocytic anemia Dementia Hypothyroidism    End of life care -At this point patient has been transitioned to full comfort measures, awaiting availability at [facility], continue with current measures including as needed morphine for dyspnea, restlessness, as well as add some as needed Ativan, glucose perianal, main goal currently is comfort, all meds and labs which are necessary has been discontinued, awaiting placement patient accepted at [facility], awaiting bed availability   DVT prophylaxis: Comfort care Code Status: DNR-comfort care Family Communication: D/W son by phone, and at bedside 10/18 Disposition:   Status is: Inpatient    Consultants:  None   Subjective:  Significant events overnight as discussed with staff, patient is unable to provide any complaints.  Objective: Vitals:   10/17/23 2218 10/18/23 0004 10/18/23 0400 10/18/23 0800  BP:    (!) 143/71  Pulse: 71 70 68 72  Resp: 16  18   Temp:    98.5 F (36.9 C)  TempSrc:    Oral  SpO2: 91% 94% 95% 92%  Weight:      Height:        Intake/Output Summary (Last 24 hours) at 10/18/2023 1131 Last data filed at 10/17/2023 2349 Gross per 24 hour  Intake --  Output 525 ml  Net -525 ml   Filed Weights   10/16/23 0634  Weight: 78 kg    Examination:  Somnolent, eyes open, but does not follow any commands or answer any questions, frail, ill-appearing Symmetrical Chest wall movement, diminished air entry bilaterally, tachypneic with scattered Rales and rhonchi RRR,No Gallops,Rubs or new Murmurs, No Parasternal Heave +ve B.Sounds, Abd Soft, No tenderness, No rebound - guarding or rigidity. No  Cyanosis, Clubbing or edema, No new Rash or bruise       Data Reviewed: I have personally reviewed following labs and imaging studies  CBC: Recent Labs  Lab 10/14/23 0821 10/16/23 0443 10/16/23 0452 10/17/23 0305 10/18/23 0431  WBC 7.5 15.5*  --  20.5* 14.3*  NEUTROABS  5.0 13.6*  --   --   --   HGB 9.7* 10.9* 11.6* 9.2* 9.0*  HCT 29.9* 33.5* 34.0* 28.2* 27.7*  MCV 92.0 91.5  --  92.8 91.1  PLT 190 227  --  173 155    Basic Metabolic Panel: Recent Labs  Lab 10/14/23 0821 10/16/23 0443 10/16/23 0452 10/17/23 0305 10/18/23 0431  NA 137 138 138 139 141  K 3.6 3.7 3.3* 3.8 3.9  CL 96* 95*  --  99 99  CO2 32 26  --  28 28  GLUCOSE 118* 111*  --  105* 99  BUN 18 25*  --  34* 32*  CREATININE 1.49* 1.57*  --  2.04* 1.64*  CALCIUM 9.3 10.4*  --  9.6 10.3    GFR: Estimated Creatinine Clearance: 26.3 mL/min (A) (by C-G formula based on SCr of 1.64 mg/dL (H)).  Liver Function Tests: Recent Labs  Lab 10/16/23 0443 10/17/23 0305  AST 23 25  ALT 16 13  ALKPHOS 96 78  BILITOT 0.7 1.3*  PROT 8.5* 7.2  ALBUMIN 2.9* 2.3*    CBG: No results for input(s): "GLUCAP" in the last 168 hours.   Recent Results (from the past 240 hour(s))  Resp panel by RT-PCR (RSV, Flu A&B, Covid) Anterior Nasal Swab     Status: None   Collection Time: 10/16/23  4:43 AM   Specimen: Anterior Nasal Swab  Result Value Ref Range Status   SARS Coronavirus 2 by RT PCR NEGATIVE NEGATIVE Final   Influenza A by PCR NEGATIVE NEGATIVE Final   Influenza B by PCR NEGATIVE NEGATIVE Final    Comment: (NOTE) The Xpert Xpress SARS-CoV-2/FLU/RSV plus assay is intended as an aid in the diagnosis of influenza from Nasopharyngeal swab specimens and should not be used as a sole basis for treatment. Nasal washings and aspirates are unacceptable for Xpert Xpress SARS-CoV-2/FLU/RSV testing.  Fact Sheet for Patients: BloggerCourse.com  Fact Sheet for Healthcare Providers: SeriousBroker.it  This test is not yet approved or cleared by the [place] FDA and has been authorized for detection and/or diagnosis of SARS-CoV-2 by FDA under an Emergency Use Authorization (EUA). This EUA will remain in effect (meaning this test can be used)  for the duration of the COVID-19 declaration under Section 564(b)(1) of the Act, 21 U.S.C. section 360bbb-3(b)(1), unless the authorization is terminated or revoked.     Resp Syncytial Virus by PCR NEGATIVE NEGATIVE Final    Comment: (NOTE) Fact Sheet for Patients: BloggerCourse.com  Fact Sheet for Healthcare Providers: SeriousBroker.it  This test is not yet approved or cleared by the [place] FDA and has been authorized for detection and/or diagnosis of SARS-CoV-2 by FDA under an Emergency Use Authorization (EUA). This EUA will remain in effect (meaning this test can be used) for the duration of the COVID-19 declaration under Section 564(b)(1) of the Act, 21 U.S.C. section 360bbb-3(b)(1), unless the authorization is terminated or revoked.  Performed at [facility], [address] [place], [place] [address]   Blood Culture (routine x 2)     Status: None (Preliminary result)   Collection Time: 10/16/23  4:43 AM   Specimen: BLOOD  Result  Value Ref Range Status   Specimen Description BLOOD SITE NOT SPECIFIED  Final   Special Requests   Final    BOTTLES DRAWN AEROBIC AND ANAEROBIC Blood Culture adequate volume   Culture  Setup Time   Final    GRAM POSITIVE COCCI ANAEROBIC BOTTLE ONLY CRITICAL RESULT CALLED TO, READ BACK BY AND VERIFIED WITH: Lorenda Cahill [id number] AT 1333 BY CM Performed at [facility], [address], [place], [place] [address]    Culture GRAM POSITIVE COCCI  Final   Report Status PENDING  Incomplete  Urine Culture     Status: Abnormal   Collection Time: 10/16/23  4:43 AM   Specimen: Urine, Catheterized  Result Value Ref Range Status   Specimen Description URINE, CATHETERIZED  Final   Special Requests   Final    NONE Reflexed from [id number] Performed at [facility], [address], [place], [place] [address]    Culture >=100,000 COLONIES/mL PSEUDOMONAS AERUGINOSA (A)  Final    Report Status 10/18/2023 FINAL  Final   Organism ID, Bacteria PSEUDOMONAS AERUGINOSA (A)  Final      Susceptibility   Pseudomonas aeruginosa - MIC*    CEFTAZIDIME 4 SENSITIVE Sensitive     CIPROFLOXACIN <=0.25 SENSITIVE Sensitive     GENTAMICIN 2 SENSITIVE Sensitive     IMIPENEM 1 SENSITIVE Sensitive     CEFEPIME 2 SENSITIVE Sensitive     * >=100,000 COLONIES/mL PSEUDOMONAS AERUGINOSA  Blood Culture ID Panel (Reflexed)     Status: Abnormal   Collection Time: 10/16/23  4:43 AM  Result Value Ref Range Status   Enterococcus faecalis NOT DETECTED NOT DETECTED Final   Enterococcus Faecium NOT DETECTED NOT DETECTED Final   Listeria monocytogenes NOT DETECTED NOT DETECTED Final   Staphylococcus species DETECTED (A) NOT DETECTED Final    Comment: CRITICAL RESULT CALLED TO, READ BACK BY AND VERIFIED WITH: PHARMD R WEINGARTEN [id number] AT 1333 BY CM    Staphylococcus aureus (BCID) NOT DETECTED NOT DETECTED Final   Staphylococcus epidermidis DETECTED (A) NOT DETECTED Final    Comment: Methicillin (oxacillin) resistant coagulase negative staphylococcus. Possible blood culture contaminant (unless isolated from more than one blood culture draw or clinical case suggests pathogenicity). No antibiotic treatment is indicated for blood  culture contaminants. CRITICAL RESULT CALLED TO, READ BACK BY AND VERIFIED WITH: PHARMD R WEINGARTEN [id number] AT 1333 BY CM    Staphylococcus lugdunensis NOT DETECTED NOT DETECTED Final   Streptococcus species NOT DETECTED NOT DETECTED Final   Streptococcus agalactiae NOT DETECTED NOT DETECTED Final   Streptococcus pneumoniae NOT DETECTED NOT DETECTED Final   Streptococcus pyogenes NOT DETECTED NOT DETECTED Final   A.calcoaceticus-baumannii NOT DETECTED NOT DETECTED Final   Bacteroides fragilis NOT DETECTED NOT DETECTED Final   Enterobacterales NOT DETECTED NOT DETECTED Final   Enterobacter cloacae complex NOT DETECTED NOT DETECTED Final   Escherichia coli NOT DETECTED  NOT DETECTED Final   Klebsiella aerogenes NOT DETECTED NOT DETECTED Final   Klebsiella oxytoca NOT DETECTED NOT DETECTED Final   Klebsiella pneumoniae NOT DETECTED NOT DETECTED Final   Proteus species NOT DETECTED NOT DETECTED Final   Salmonella species NOT DETECTED NOT DETECTED Final   Serratia marcescens NOT DETECTED NOT DETECTED Final   Haemophilus influenzae NOT DETECTED NOT DETECTED Final   Neisseria meningitidis NOT DETECTED NOT DETECTED Final   Pseudomonas aeruginosa NOT DETECTED NOT DETECTED Final   Stenotrophomonas maltophilia NOT DETECTED NOT DETECTED Final   Candida albicans NOT DETECTED NOT DETECTED  Final   Candida auris NOT DETECTED NOT DETECTED Final   Candida glabrata NOT DETECTED NOT DETECTED Final   Candida krusei NOT DETECTED NOT DETECTED Final   Candida parapsilosis NOT DETECTED NOT DETECTED Final   Candida tropicalis NOT DETECTED NOT DETECTED Final   Cryptococcus neoformans/gattii NOT DETECTED NOT DETECTED Final   Methicillin resistance mecA/C DETECTED (A) NOT DETECTED Final    Comment: CRITICAL RESULT CALLED TO, READ BACK BY AND VERIFIED WITH: Lorinda Creed [id number] AT 1333 BY CM Performed at [facility], [address] [place], [place] [address]   Blood Culture (routine x 2)     Status: None (Preliminary result)   Collection Time: 10/16/23  6:03 AM   Specimen: BLOOD LEFT ARM  Result Value Ref Range Status   Specimen Description BLOOD LEFT ARM  Final   Special Requests   Final    BOTTLES DRAWN AEROBIC AND ANAEROBIC Blood Culture adequate volume   Culture   Final    NO GROWTH 2 DAYS Performed at [facility], [address], [place], [place] [address]    Report Status PENDING  Incomplete         Radiology Studies: No results found.      Scheduled Meds:  guaiFENesin  600 mg Oral BID   mouth rinse  15 mL Mouth Rinse 4 times per day   sodium chloride flush  3 mL Intravenous Q12H   Continuous Infusions:     LOS: 2 days        Huey Bienenstock, MD [facility]   To contact the attending provider between 7A-7P or the covering provider during after hours 7P-7A, please log into the web site [email or web address] and access using universal [place] password for that web site. If you do not have the password, please call the hospital operator.  10/18/2023, 11:31 AM

## 2023-10-19 DIAGNOSIS — J189 Pneumonia, unspecified organism: Secondary | ICD-10-CM | POA: Diagnosis not present

## 2023-10-19 DIAGNOSIS — A419 Sepsis, unspecified organism: Secondary | ICD-10-CM | POA: Diagnosis not present

## 2023-10-19 DIAGNOSIS — J9601 Acute respiratory failure with hypoxia: Secondary | ICD-10-CM | POA: Diagnosis not present

## 2023-10-19 LAB — CULTURE, BLOOD (ROUTINE X 2): Special Requests: ADEQUATE

## 2023-10-19 MED ORDER — LORAZEPAM 1 MG PO TABS: 1.0000 mg | ORAL_TABLET | ORAL | Status: DC | PRN

## 2023-10-19 MED ORDER — MORPHINE SULFATE 20 MG/5ML PO SOLN
5.0000 mg | ORAL | Status: DC | PRN
Start: 2023-10-19 — End: 2023-10-28

## 2023-10-19 MED ORDER — PREGABALIN 50 MG PO CAPS: 50.0000 mg | ORAL_CAPSULE | Freq: Every day | ORAL | Status: DC

## 2023-10-19 MED ORDER — DULOXETINE HCL 60 MG PO CPEP: 60.0000 mg | ORAL_CAPSULE | Freq: Every day | ORAL | Status: DC

## 2023-10-19 MED ORDER — ACETAMINOPHEN 325 MG PO TABS: 650.0000 mg | ORAL_TABLET | Freq: Four times a day (QID) | ORAL | Status: DC | PRN

## 2023-10-19 MED ORDER — LORAZEPAM 2 MG/ML PO CONC: 1.0000 mg | ORAL | Status: DC | PRN

## 2023-10-19 MED ORDER — ACETAMINOPHEN 650 MG RE SUPP: 650.0000 mg | Freq: Four times a day (QID) | RECTAL | Status: DC | PRN

## 2023-10-19 NOTE — Discharge Summary (Addendum)
Arthralgias Fatigue    Consultations: None   Procedures/Studies: DG Chest Port 1 View  Result Date: 10/16/2023 CLINICAL DATA:  89 year old male with shortness of breath. EXAM: PORTABLE CHEST 1 VIEW COMPARISON:  Chest x-ray 10/07/2023. FINDINGS: Diffuse interstitial prominence and widespread peribronchial cuffing with patchy multifocal airspace disease noted throughout the lungs bilaterally (left-greater-than-right), with worsened aeration, particularly in the left lung compared to the prior study. Small to moderate left pleural effusion. No definite right pleural effusion. No pneumothorax. Heart size is normal. Atherosclerotic calcifications in the thoracic aorta. Left-sided pacemaker device in place with lead tips projecting over the expected location of the right ventricle. IMPRESSION: 1.  Severe multilobar bilateral bronchopneumonia (left-greater-than-right), worsened in the left lung compared to the prior study. 2. Small to moderate left pleural effusion. 3. Aortic atherosclerosis. Electronically Signed   By: Trudie Reed M.D.   On: 10/16/2023 06:58   DG CHEST PORT 1 VIEW  Result Date: 10/07/2023 CLINICAL DATA:  Shortness of breath. EXAM: PORTABLE CHEST 1 VIEW COMPARISON:  10/03/2023. FINDINGS: Redemonstration of diffuse increased interstitial markings with hilar and bibasilar predominance. There is probable slight interval worsening since the prior exam. There are bilateral small layering pleural effusions, left-greater-than-right. There is also right fissural effusion. No pneumothorax. Stable cardio-mediastinal silhouette. There is a left sided 2-lead pacemaker. No acute osseous abnormalities. The soft tissues are within normal limits. IMPRESSION: *Slight interval worsening of congestive heart failure/pulmonary edema. Correlate clinically to exclude superimposed pneumonia. Electronically Signed   By: Jules Schick M.D.   On: 10/07/2023 08:35   ECHOCARDIOGRAM COMPLETE  Result Date: 10/04/2023    ECHOCARDIOGRAM REPORT   Patient Name:   Douglas Walker Date of Exam: 10/04/2023 Medical Rec #:  [id number]          Height:       69.0 in Accession #:    [id number]         Weight:       180.3 lb Date of Birth:  [birth year]          BSA:          1.977 m Patient Age:    89 years           BP:           122/75 mmHg Patient Gender: M                  HR:           70 bpm. Exam Location:  Inpatient Procedure: 2D Echo, Color Doppler and Cardiac Doppler Indications:    dyspnea  History:        Patient has prior history of Echocardiogram examinations, most                 recent 01/28/2021. Pacemaker, chronic kidney disease; Risk                 Factors:Hypertension.  Sonographer:    Delcie Roch RDCS Referring Phys: [id number] MAURICIO DANIEL ARRIEN IMPRESSIONS  1. Left ventricular ejection  fraction, by estimation, is 50 to 55%. The left ventricle has low normal function. The left ventricle has no regional wall motion abnormalities. There is mild left ventricular hypertrophy. Left ventricular diastolic parameters are indeterminate.  2. Pacing wires in RA/RV . Right ventricular systolic function is normal. The right ventricular size is normal.  3. Left atrial size was mildly dilated.  4. Right atrial size was mildly dilated.  5. The mitral valve is abnormal. Trivial mitral  Physician Discharge Summary  Douglas Walker WUJ:[id number] DOB: 24-Apr-1925 DOA: 10/16/2023  PCP: Georgann Housekeeper, MD  Admit date: 10/16/2023 Discharge date: 10/19/2023  Disposition:  (Residential Hospice)  Recommendations for Outpatient Follow-up:  Management per hospice facility, patient on feeding for comfort.  Brief/Interim Summary:  Douglas Walker is a 89 y.o. male with medical history significant of PAF on Eliquis, HFpEF (EF 50-55%), tachy-brady syndrome s/p PPM, CKD stage IIIa, chronic anemia and thrombocytopenia, HTN, HLD, hypothyroidism, essential tremor, dementia, hard of hearing, recurrent UTI, BPH with chronic indwelling Foley catheter who presents back to the hospital with shortness of breath.  Patient with multiple hospitalization over the last few weeks, secondary to respiratory failure, aspiration pneumonia, he was just discharged 10/15/2023, he developed dyspnea, he was noted to be septic in ED, with hypoxia, with concern for aspiration pneumonia as well.  Patient with multiple admissions over the last month secondary to aspiration pneumonia, patient at end-of-life which I have discussed with the son, with significant deterioration over the last few weeks, decision has been made to transition for comfort measures.     Acute respiratory failure with hypoxia   Sepsis POA  secondary to multifocal aspiration pneumonia catheter associated urinary tract infection Acute on chronic heart failure with mildly reduced EF Paroxysmal atrial fibrillation on chronic anticoagulation Sick sinus syndrome s/p permanent pacemaker Dysphagia Chronic kidney disease stage IIIb Normocytic anemia Dementia Hypothyroidism Pressure ulcer     End of life care -At this point patient has been transitioned to full comfort measures, patient is being discharged to residential hospice facility at [facility], goals of care is comfort, he is on as needed Ativan, morphine as needed, feeding for  comfort.     Pressure Injury 07/30/23 Sacrum Stage 2 -  Partial thickness loss of dermis presenting as a shallow open injury with a red, pink wound bed without slough. (Active)  07/30/23 1156  Location: Sacrum  Location Orientation:   Staging: Stage 2 -  Partial thickness loss of dermis presenting as a shallow open injury with a red, pink wound bed without slough.  Wound Description (Comments):   Present on Admission: Yes     Pressure Injury 07/30/23 Hip Right Unstageable - Full thickness tissue loss in which the base of the injury is covered by slough (yellow, tan, gray, green or brown) and/or eschar (tan, brown or black) in the wound bed. (Active)  07/30/23 1156  Location: Hip  Location Orientation: Right  Staging: Unstageable - Full thickness tissue loss in which the base of the injury is covered by slough (yellow, tan, gray, green or brown) and/or eschar (tan, brown or black) in the wound bed.  Wound Description (Comments):   Present on Admission: Yes     Pressure Injury 10/03/23 Vertebral column Lower Deep Tissue Pressure Injury - Purple or maroon localized area of discolored intact skin or blood-filled blister due to damage of underlying soft tissue from pressure and/or shear. Area of purplish discolora (Active)  10/03/23 2059  Location: Vertebral column  Location Orientation: Lower  Staging: Deep Tissue Pressure Injury - Purple or maroon localized area of discolored intact skin or blood-filled blister due to damage of underlying soft tissue from pressure and/or shear.  Wound Description (Comments): Area of purplish discoloration above stage 2 on sacrum  Present on Admission: Yes     Discharge Diagnoses:  Active Problems:   Acute respiratory failure with hypoxia (HCC)   Sepsis due to pneumonia (HCC)   Catheter-associated urinary tract infection (HCC)  Arthralgias Fatigue    Consultations: None   Procedures/Studies: DG Chest Port 1 View  Result Date: 10/16/2023 CLINICAL DATA:  89 year old male with shortness of breath. EXAM: PORTABLE CHEST 1 VIEW COMPARISON:  Chest x-ray 10/07/2023. FINDINGS: Diffuse interstitial prominence and widespread peribronchial cuffing with patchy multifocal airspace disease noted throughout the lungs bilaterally (left-greater-than-right), with worsened aeration, particularly in the left lung compared to the prior study. Small to moderate left pleural effusion. No definite right pleural effusion. No pneumothorax. Heart size is normal. Atherosclerotic calcifications in the thoracic aorta. Left-sided pacemaker device in place with lead tips projecting over the expected location of the right ventricle. IMPRESSION: 1.  Severe multilobar bilateral bronchopneumonia (left-greater-than-right), worsened in the left lung compared to the prior study. 2. Small to moderate left pleural effusion. 3. Aortic atherosclerosis. Electronically Signed   By: Trudie Reed M.D.   On: 10/16/2023 06:58   DG CHEST PORT 1 VIEW  Result Date: 10/07/2023 CLINICAL DATA:  Shortness of breath. EXAM: PORTABLE CHEST 1 VIEW COMPARISON:  10/03/2023. FINDINGS: Redemonstration of diffuse increased interstitial markings with hilar and bibasilar predominance. There is probable slight interval worsening since the prior exam. There are bilateral small layering pleural effusions, left-greater-than-right. There is also right fissural effusion. No pneumothorax. Stable cardio-mediastinal silhouette. There is a left sided 2-lead pacemaker. No acute osseous abnormalities. The soft tissues are within normal limits. IMPRESSION: *Slight interval worsening of congestive heart failure/pulmonary edema. Correlate clinically to exclude superimposed pneumonia. Electronically Signed   By: Jules Schick M.D.   On: 10/07/2023 08:35   ECHOCARDIOGRAM COMPLETE  Result Date: 10/04/2023    ECHOCARDIOGRAM REPORT   Patient Name:   Douglas Walker Date of Exam: 10/04/2023 Medical Rec #:  [id number]          Height:       69.0 in Accession #:    [id number]         Weight:       180.3 lb Date of Birth:  [birth year]          BSA:          1.977 m Patient Age:    89 years           BP:           122/75 mmHg Patient Gender: M                  HR:           70 bpm. Exam Location:  Inpatient Procedure: 2D Echo, Color Doppler and Cardiac Doppler Indications:    dyspnea  History:        Patient has prior history of Echocardiogram examinations, most                 recent 01/28/2021. Pacemaker, chronic kidney disease; Risk                 Factors:Hypertension.  Sonographer:    Delcie Roch RDCS Referring Phys: [id number] MAURICIO DANIEL ARRIEN IMPRESSIONS  1. Left ventricular ejection  fraction, by estimation, is 50 to 55%. The left ventricle has low normal function. The left ventricle has no regional wall motion abnormalities. There is mild left ventricular hypertrophy. Left ventricular diastolic parameters are indeterminate.  2. Pacing wires in RA/RV . Right ventricular systolic function is normal. The right ventricular size is normal.  3. Left atrial size was mildly dilated.  4. Right atrial size was mildly dilated.  5. The mitral valve is abnormal. Trivial mitral  Arthralgias Fatigue    Consultations: None   Procedures/Studies: DG Chest Port 1 View  Result Date: 10/16/2023 CLINICAL DATA:  89 year old male with shortness of breath. EXAM: PORTABLE CHEST 1 VIEW COMPARISON:  Chest x-ray 10/07/2023. FINDINGS: Diffuse interstitial prominence and widespread peribronchial cuffing with patchy multifocal airspace disease noted throughout the lungs bilaterally (left-greater-than-right), with worsened aeration, particularly in the left lung compared to the prior study. Small to moderate left pleural effusion. No definite right pleural effusion. No pneumothorax. Heart size is normal. Atherosclerotic calcifications in the thoracic aorta. Left-sided pacemaker device in place with lead tips projecting over the expected location of the right ventricle. IMPRESSION: 1.  Severe multilobar bilateral bronchopneumonia (left-greater-than-right), worsened in the left lung compared to the prior study. 2. Small to moderate left pleural effusion. 3. Aortic atherosclerosis. Electronically Signed   By: Trudie Reed M.D.   On: 10/16/2023 06:58   DG CHEST PORT 1 VIEW  Result Date: 10/07/2023 CLINICAL DATA:  Shortness of breath. EXAM: PORTABLE CHEST 1 VIEW COMPARISON:  10/03/2023. FINDINGS: Redemonstration of diffuse increased interstitial markings with hilar and bibasilar predominance. There is probable slight interval worsening since the prior exam. There are bilateral small layering pleural effusions, left-greater-than-right. There is also right fissural effusion. No pneumothorax. Stable cardio-mediastinal silhouette. There is a left sided 2-lead pacemaker. No acute osseous abnormalities. The soft tissues are within normal limits. IMPRESSION: *Slight interval worsening of congestive heart failure/pulmonary edema. Correlate clinically to exclude superimposed pneumonia. Electronically Signed   By: Jules Schick M.D.   On: 10/07/2023 08:35   ECHOCARDIOGRAM COMPLETE  Result Date: 10/04/2023    ECHOCARDIOGRAM REPORT   Patient Name:   Douglas Walker Date of Exam: 10/04/2023 Medical Rec #:  [id number]          Height:       69.0 in Accession #:    [id number]         Weight:       180.3 lb Date of Birth:  [birth year]          BSA:          1.977 m Patient Age:    89 years           BP:           122/75 mmHg Patient Gender: M                  HR:           70 bpm. Exam Location:  Inpatient Procedure: 2D Echo, Color Doppler and Cardiac Doppler Indications:    dyspnea  History:        Patient has prior history of Echocardiogram examinations, most                 recent 01/28/2021. Pacemaker, chronic kidney disease; Risk                 Factors:Hypertension.  Sonographer:    Delcie Roch RDCS Referring Phys: [id number] MAURICIO DANIEL ARRIEN IMPRESSIONS  1. Left ventricular ejection  fraction, by estimation, is 50 to 55%. The left ventricle has low normal function. The left ventricle has no regional wall motion abnormalities. There is mild left ventricular hypertrophy. Left ventricular diastolic parameters are indeterminate.  2. Pacing wires in RA/RV . Right ventricular systolic function is normal. The right ventricular size is normal.  3. Left atrial size was mildly dilated.  4. Right atrial size was mildly dilated.  5. The mitral valve is abnormal. Trivial mitral  Physician Discharge Summary  Douglas Walker WUJ:[id number] DOB: 24-Apr-1925 DOA: 10/16/2023  PCP: Georgann Housekeeper, MD  Admit date: 10/16/2023 Discharge date: 10/19/2023  Disposition:  (Residential Hospice)  Recommendations for Outpatient Follow-up:  Management per hospice facility, patient on feeding for comfort.  Brief/Interim Summary:  Douglas Walker is a 89 y.o. male with medical history significant of PAF on Eliquis, HFpEF (EF 50-55%), tachy-brady syndrome s/p PPM, CKD stage IIIa, chronic anemia and thrombocytopenia, HTN, HLD, hypothyroidism, essential tremor, dementia, hard of hearing, recurrent UTI, BPH with chronic indwelling Foley catheter who presents back to the hospital with shortness of breath.  Patient with multiple hospitalization over the last few weeks, secondary to respiratory failure, aspiration pneumonia, he was just discharged 10/15/2023, he developed dyspnea, he was noted to be septic in ED, with hypoxia, with concern for aspiration pneumonia as well.  Patient with multiple admissions over the last month secondary to aspiration pneumonia, patient at end-of-life which I have discussed with the son, with significant deterioration over the last few weeks, decision has been made to transition for comfort measures.     Acute respiratory failure with hypoxia   Sepsis POA  secondary to multifocal aspiration pneumonia catheter associated urinary tract infection Acute on chronic heart failure with mildly reduced EF Paroxysmal atrial fibrillation on chronic anticoagulation Sick sinus syndrome s/p permanent pacemaker Dysphagia Chronic kidney disease stage IIIb Normocytic anemia Dementia Hypothyroidism Pressure ulcer     End of life care -At this point patient has been transitioned to full comfort measures, patient is being discharged to residential hospice facility at [facility], goals of care is comfort, he is on as needed Ativan, morphine as needed, feeding for  comfort.     Pressure Injury 07/30/23 Sacrum Stage 2 -  Partial thickness loss of dermis presenting as a shallow open injury with a red, pink wound bed without slough. (Active)  07/30/23 1156  Location: Sacrum  Location Orientation:   Staging: Stage 2 -  Partial thickness loss of dermis presenting as a shallow open injury with a red, pink wound bed without slough.  Wound Description (Comments):   Present on Admission: Yes     Pressure Injury 07/30/23 Hip Right Unstageable - Full thickness tissue loss in which the base of the injury is covered by slough (yellow, tan, gray, green or brown) and/or eschar (tan, brown or black) in the wound bed. (Active)  07/30/23 1156  Location: Hip  Location Orientation: Right  Staging: Unstageable - Full thickness tissue loss in which the base of the injury is covered by slough (yellow, tan, gray, green or brown) and/or eschar (tan, brown or black) in the wound bed.  Wound Description (Comments):   Present on Admission: Yes     Pressure Injury 10/03/23 Vertebral column Lower Deep Tissue Pressure Injury - Purple or maroon localized area of discolored intact skin or blood-filled blister due to damage of underlying soft tissue from pressure and/or shear. Area of purplish discolora (Active)  10/03/23 2059  Location: Vertebral column  Location Orientation: Lower  Staging: Deep Tissue Pressure Injury - Purple or maroon localized area of discolored intact skin or blood-filled blister due to damage of underlying soft tissue from pressure and/or shear.  Wound Description (Comments): Area of purplish discoloration above stage 2 on sacrum  Present on Admission: Yes     Discharge Diagnoses:  Active Problems:   Acute respiratory failure with hypoxia (HCC)   Sepsis due to pneumonia (HCC)   Catheter-associated urinary tract infection (HCC)  Physician Discharge Summary  Douglas Walker WUJ:[id number] DOB: 24-Apr-1925 DOA: 10/16/2023  PCP: Georgann Housekeeper, MD  Admit date: 10/16/2023 Discharge date: 10/19/2023  Disposition:  (Residential Hospice)  Recommendations for Outpatient Follow-up:  Management per hospice facility, patient on feeding for comfort.  Brief/Interim Summary:  Douglas Walker is a 89 y.o. male with medical history significant of PAF on Eliquis, HFpEF (EF 50-55%), tachy-brady syndrome s/p PPM, CKD stage IIIa, chronic anemia and thrombocytopenia, HTN, HLD, hypothyroidism, essential tremor, dementia, hard of hearing, recurrent UTI, BPH with chronic indwelling Foley catheter who presents back to the hospital with shortness of breath.  Patient with multiple hospitalization over the last few weeks, secondary to respiratory failure, aspiration pneumonia, he was just discharged 10/15/2023, he developed dyspnea, he was noted to be septic in ED, with hypoxia, with concern for aspiration pneumonia as well.  Patient with multiple admissions over the last month secondary to aspiration pneumonia, patient at end-of-life which I have discussed with the son, with significant deterioration over the last few weeks, decision has been made to transition for comfort measures.     Acute respiratory failure with hypoxia   Sepsis POA  secondary to multifocal aspiration pneumonia catheter associated urinary tract infection Acute on chronic heart failure with mildly reduced EF Paroxysmal atrial fibrillation on chronic anticoagulation Sick sinus syndrome s/p permanent pacemaker Dysphagia Chronic kidney disease stage IIIb Normocytic anemia Dementia Hypothyroidism Pressure ulcer     End of life care -At this point patient has been transitioned to full comfort measures, patient is being discharged to residential hospice facility at [facility], goals of care is comfort, he is on as needed Ativan, morphine as needed, feeding for  comfort.     Pressure Injury 07/30/23 Sacrum Stage 2 -  Partial thickness loss of dermis presenting as a shallow open injury with a red, pink wound bed without slough. (Active)  07/30/23 1156  Location: Sacrum  Location Orientation:   Staging: Stage 2 -  Partial thickness loss of dermis presenting as a shallow open injury with a red, pink wound bed without slough.  Wound Description (Comments):   Present on Admission: Yes     Pressure Injury 07/30/23 Hip Right Unstageable - Full thickness tissue loss in which the base of the injury is covered by slough (yellow, tan, gray, green or brown) and/or eschar (tan, brown or black) in the wound bed. (Active)  07/30/23 1156  Location: Hip  Location Orientation: Right  Staging: Unstageable - Full thickness tissue loss in which the base of the injury is covered by slough (yellow, tan, gray, green or brown) and/or eschar (tan, brown or black) in the wound bed.  Wound Description (Comments):   Present on Admission: Yes     Pressure Injury 10/03/23 Vertebral column Lower Deep Tissue Pressure Injury - Purple or maroon localized area of discolored intact skin or blood-filled blister due to damage of underlying soft tissue from pressure and/or shear. Area of purplish discolora (Active)  10/03/23 2059  Location: Vertebral column  Location Orientation: Lower  Staging: Deep Tissue Pressure Injury - Purple or maroon localized area of discolored intact skin or blood-filled blister due to damage of underlying soft tissue from pressure and/or shear.  Wound Description (Comments): Area of purplish discoloration above stage 2 on sacrum  Present on Admission: Yes     Discharge Diagnoses:  Active Problems:   Acute respiratory failure with hypoxia (HCC)   Sepsis due to pneumonia (HCC)   Catheter-associated urinary tract infection (HCC)  Arthralgias Fatigue    Consultations: None   Procedures/Studies: DG Chest Port 1 View  Result Date: 10/16/2023 CLINICAL DATA:  89 year old male with shortness of breath. EXAM: PORTABLE CHEST 1 VIEW COMPARISON:  Chest x-ray 10/07/2023. FINDINGS: Diffuse interstitial prominence and widespread peribronchial cuffing with patchy multifocal airspace disease noted throughout the lungs bilaterally (left-greater-than-right), with worsened aeration, particularly in the left lung compared to the prior study. Small to moderate left pleural effusion. No definite right pleural effusion. No pneumothorax. Heart size is normal. Atherosclerotic calcifications in the thoracic aorta. Left-sided pacemaker device in place with lead tips projecting over the expected location of the right ventricle. IMPRESSION: 1.  Severe multilobar bilateral bronchopneumonia (left-greater-than-right), worsened in the left lung compared to the prior study. 2. Small to moderate left pleural effusion. 3. Aortic atherosclerosis. Electronically Signed   By: Trudie Reed M.D.   On: 10/16/2023 06:58   DG CHEST PORT 1 VIEW  Result Date: 10/07/2023 CLINICAL DATA:  Shortness of breath. EXAM: PORTABLE CHEST 1 VIEW COMPARISON:  10/03/2023. FINDINGS: Redemonstration of diffuse increased interstitial markings with hilar and bibasilar predominance. There is probable slight interval worsening since the prior exam. There are bilateral small layering pleural effusions, left-greater-than-right. There is also right fissural effusion. No pneumothorax. Stable cardio-mediastinal silhouette. There is a left sided 2-lead pacemaker. No acute osseous abnormalities. The soft tissues are within normal limits. IMPRESSION: *Slight interval worsening of congestive heart failure/pulmonary edema. Correlate clinically to exclude superimposed pneumonia. Electronically Signed   By: Jules Schick M.D.   On: 10/07/2023 08:35   ECHOCARDIOGRAM COMPLETE  Result Date: 10/04/2023    ECHOCARDIOGRAM REPORT   Patient Name:   Douglas Walker Date of Exam: 10/04/2023 Medical Rec #:  [id number]          Height:       69.0 in Accession #:    [id number]         Weight:       180.3 lb Date of Birth:  [birth year]          BSA:          1.977 m Patient Age:    89 years           BP:           122/75 mmHg Patient Gender: M                  HR:           70 bpm. Exam Location:  Inpatient Procedure: 2D Echo, Color Doppler and Cardiac Doppler Indications:    dyspnea  History:        Patient has prior history of Echocardiogram examinations, most                 recent 01/28/2021. Pacemaker, chronic kidney disease; Risk                 Factors:Hypertension.  Sonographer:    Delcie Roch RDCS Referring Phys: [id number] MAURICIO DANIEL ARRIEN IMPRESSIONS  1. Left ventricular ejection  fraction, by estimation, is 50 to 55%. The left ventricle has low normal function. The left ventricle has no regional wall motion abnormalities. There is mild left ventricular hypertrophy. Left ventricular diastolic parameters are indeterminate.  2. Pacing wires in RA/RV . Right ventricular systolic function is normal. The right ventricular size is normal.  3. Left atrial size was mildly dilated.  4. Right atrial size was mildly dilated.  5. The mitral valve is abnormal. Trivial mitral  Physician Discharge Summary  Douglas Walker WUJ:[id number] DOB: 24-Apr-1925 DOA: 10/16/2023  PCP: Georgann Housekeeper, MD  Admit date: 10/16/2023 Discharge date: 10/19/2023  Disposition:  (Residential Hospice)  Recommendations for Outpatient Follow-up:  Management per hospice facility, patient on feeding for comfort.  Brief/Interim Summary:  Douglas Walker is a 89 y.o. male with medical history significant of PAF on Eliquis, HFpEF (EF 50-55%), tachy-brady syndrome s/p PPM, CKD stage IIIa, chronic anemia and thrombocytopenia, HTN, HLD, hypothyroidism, essential tremor, dementia, hard of hearing, recurrent UTI, BPH with chronic indwelling Foley catheter who presents back to the hospital with shortness of breath.  Patient with multiple hospitalization over the last few weeks, secondary to respiratory failure, aspiration pneumonia, he was just discharged 10/15/2023, he developed dyspnea, he was noted to be septic in ED, with hypoxia, with concern for aspiration pneumonia as well.  Patient with multiple admissions over the last month secondary to aspiration pneumonia, patient at end-of-life which I have discussed with the son, with significant deterioration over the last few weeks, decision has been made to transition for comfort measures.     Acute respiratory failure with hypoxia   Sepsis POA  secondary to multifocal aspiration pneumonia catheter associated urinary tract infection Acute on chronic heart failure with mildly reduced EF Paroxysmal atrial fibrillation on chronic anticoagulation Sick sinus syndrome s/p permanent pacemaker Dysphagia Chronic kidney disease stage IIIb Normocytic anemia Dementia Hypothyroidism Pressure ulcer     End of life care -At this point patient has been transitioned to full comfort measures, patient is being discharged to residential hospice facility at [facility], goals of care is comfort, he is on as needed Ativan, morphine as needed, feeding for  comfort.     Pressure Injury 07/30/23 Sacrum Stage 2 -  Partial thickness loss of dermis presenting as a shallow open injury with a red, pink wound bed without slough. (Active)  07/30/23 1156  Location: Sacrum  Location Orientation:   Staging: Stage 2 -  Partial thickness loss of dermis presenting as a shallow open injury with a red, pink wound bed without slough.  Wound Description (Comments):   Present on Admission: Yes     Pressure Injury 07/30/23 Hip Right Unstageable - Full thickness tissue loss in which the base of the injury is covered by slough (yellow, tan, gray, green or brown) and/or eschar (tan, brown or black) in the wound bed. (Active)  07/30/23 1156  Location: Hip  Location Orientation: Right  Staging: Unstageable - Full thickness tissue loss in which the base of the injury is covered by slough (yellow, tan, gray, green or brown) and/or eschar (tan, brown or black) in the wound bed.  Wound Description (Comments):   Present on Admission: Yes     Pressure Injury 10/03/23 Vertebral column Lower Deep Tissue Pressure Injury - Purple or maroon localized area of discolored intact skin or blood-filled blister due to damage of underlying soft tissue from pressure and/or shear. Area of purplish discolora (Active)  10/03/23 2059  Location: Vertebral column  Location Orientation: Lower  Staging: Deep Tissue Pressure Injury - Purple or maroon localized area of discolored intact skin or blood-filled blister due to damage of underlying soft tissue from pressure and/or shear.  Wound Description (Comments): Area of purplish discoloration above stage 2 on sacrum  Present on Admission: Yes     Discharge Diagnoses:  Active Problems:   Acute respiratory failure with hypoxia (HCC)   Sepsis due to pneumonia (HCC)   Catheter-associated urinary tract infection (HCC)  Arthralgias Fatigue    Consultations: None   Procedures/Studies: DG Chest Port 1 View  Result Date: 10/16/2023 CLINICAL DATA:  89 year old male with shortness of breath. EXAM: PORTABLE CHEST 1 VIEW COMPARISON:  Chest x-ray 10/07/2023. FINDINGS: Diffuse interstitial prominence and widespread peribronchial cuffing with patchy multifocal airspace disease noted throughout the lungs bilaterally (left-greater-than-right), with worsened aeration, particularly in the left lung compared to the prior study. Small to moderate left pleural effusion. No definite right pleural effusion. No pneumothorax. Heart size is normal. Atherosclerotic calcifications in the thoracic aorta. Left-sided pacemaker device in place with lead tips projecting over the expected location of the right ventricle. IMPRESSION: 1.  Severe multilobar bilateral bronchopneumonia (left-greater-than-right), worsened in the left lung compared to the prior study. 2. Small to moderate left pleural effusion. 3. Aortic atherosclerosis. Electronically Signed   By: Trudie Reed M.D.   On: 10/16/2023 06:58   DG CHEST PORT 1 VIEW  Result Date: 10/07/2023 CLINICAL DATA:  Shortness of breath. EXAM: PORTABLE CHEST 1 VIEW COMPARISON:  10/03/2023. FINDINGS: Redemonstration of diffuse increased interstitial markings with hilar and bibasilar predominance. There is probable slight interval worsening since the prior exam. There are bilateral small layering pleural effusions, left-greater-than-right. There is also right fissural effusion. No pneumothorax. Stable cardio-mediastinal silhouette. There is a left sided 2-lead pacemaker. No acute osseous abnormalities. The soft tissues are within normal limits. IMPRESSION: *Slight interval worsening of congestive heart failure/pulmonary edema. Correlate clinically to exclude superimposed pneumonia. Electronically Signed   By: Jules Schick M.D.   On: 10/07/2023 08:35   ECHOCARDIOGRAM COMPLETE  Result Date: 10/04/2023    ECHOCARDIOGRAM REPORT   Patient Name:   Douglas Walker Date of Exam: 10/04/2023 Medical Rec #:  [id number]          Height:       69.0 in Accession #:    [id number]         Weight:       180.3 lb Date of Birth:  [birth year]          BSA:          1.977 m Patient Age:    89 years           BP:           122/75 mmHg Patient Gender: M                  HR:           70 bpm. Exam Location:  Inpatient Procedure: 2D Echo, Color Doppler and Cardiac Doppler Indications:    dyspnea  History:        Patient has prior history of Echocardiogram examinations, most                 recent 01/28/2021. Pacemaker, chronic kidney disease; Risk                 Factors:Hypertension.  Sonographer:    Delcie Roch RDCS Referring Phys: [id number] MAURICIO DANIEL ARRIEN IMPRESSIONS  1. Left ventricular ejection  fraction, by estimation, is 50 to 55%. The left ventricle has low normal function. The left ventricle has no regional wall motion abnormalities. There is mild left ventricular hypertrophy. Left ventricular diastolic parameters are indeterminate.  2. Pacing wires in RA/RV . Right ventricular systolic function is normal. The right ventricular size is normal.  3. Left atrial size was mildly dilated.  4. Right atrial size was mildly dilated.  5. The mitral valve is abnormal. Trivial mitral  Physician Discharge Summary  Douglas Walker WUJ:[id number] DOB: 24-Apr-1925 DOA: 10/16/2023  PCP: Georgann Housekeeper, MD  Admit date: 10/16/2023 Discharge date: 10/19/2023  Disposition:  (Residential Hospice)  Recommendations for Outpatient Follow-up:  Management per hospice facility, patient on feeding for comfort.  Brief/Interim Summary:  Douglas Walker is a 89 y.o. male with medical history significant of PAF on Eliquis, HFpEF (EF 50-55%), tachy-brady syndrome s/p PPM, CKD stage IIIa, chronic anemia and thrombocytopenia, HTN, HLD, hypothyroidism, essential tremor, dementia, hard of hearing, recurrent UTI, BPH with chronic indwelling Foley catheter who presents back to the hospital with shortness of breath.  Patient with multiple hospitalization over the last few weeks, secondary to respiratory failure, aspiration pneumonia, he was just discharged 10/15/2023, he developed dyspnea, he was noted to be septic in ED, with hypoxia, with concern for aspiration pneumonia as well.  Patient with multiple admissions over the last month secondary to aspiration pneumonia, patient at end-of-life which I have discussed with the son, with significant deterioration over the last few weeks, decision has been made to transition for comfort measures.     Acute respiratory failure with hypoxia   Sepsis POA  secondary to multifocal aspiration pneumonia catheter associated urinary tract infection Acute on chronic heart failure with mildly reduced EF Paroxysmal atrial fibrillation on chronic anticoagulation Sick sinus syndrome s/p permanent pacemaker Dysphagia Chronic kidney disease stage IIIb Normocytic anemia Dementia Hypothyroidism Pressure ulcer     End of life care -At this point patient has been transitioned to full comfort measures, patient is being discharged to residential hospice facility at [facility], goals of care is comfort, he is on as needed Ativan, morphine as needed, feeding for  comfort.     Pressure Injury 07/30/23 Sacrum Stage 2 -  Partial thickness loss of dermis presenting as a shallow open injury with a red, pink wound bed without slough. (Active)  07/30/23 1156  Location: Sacrum  Location Orientation:   Staging: Stage 2 -  Partial thickness loss of dermis presenting as a shallow open injury with a red, pink wound bed without slough.  Wound Description (Comments):   Present on Admission: Yes     Pressure Injury 07/30/23 Hip Right Unstageable - Full thickness tissue loss in which the base of the injury is covered by slough (yellow, tan, gray, green or brown) and/or eschar (tan, brown or black) in the wound bed. (Active)  07/30/23 1156  Location: Hip  Location Orientation: Right  Staging: Unstageable - Full thickness tissue loss in which the base of the injury is covered by slough (yellow, tan, gray, green or brown) and/or eschar (tan, brown or black) in the wound bed.  Wound Description (Comments):   Present on Admission: Yes     Pressure Injury 10/03/23 Vertebral column Lower Deep Tissue Pressure Injury - Purple or maroon localized area of discolored intact skin or blood-filled blister due to damage of underlying soft tissue from pressure and/or shear. Area of purplish discolora (Active)  10/03/23 2059  Location: Vertebral column  Location Orientation: Lower  Staging: Deep Tissue Pressure Injury - Purple or maroon localized area of discolored intact skin or blood-filled blister due to damage of underlying soft tissue from pressure and/or shear.  Wound Description (Comments): Area of purplish discoloration above stage 2 on sacrum  Present on Admission: Yes     Discharge Diagnoses:  Active Problems:   Acute respiratory failure with hypoxia (HCC)   Sepsis due to pneumonia (HCC)   Catheter-associated urinary tract infection (HCC)

## 2023-10-19 NOTE — Discharge Instructions (Signed)
Management per hospice facility, patient on feeding for comfort.

## 2023-10-19 NOTE — Plan of Care (Signed)
  Problem: Activity: Goal: Ability to tolerate increased activity will improve Outcome: Adequate for Discharge   Problem: Clinical Measurements: Goal: Ability to maintain a body temperature in the normal range will improve Outcome: Adequate for Discharge   Problem: Respiratory: Goal: Ability to maintain adequate ventilation will improve Outcome: Adequate for Discharge Goal: Ability to maintain a clear airway will improve Outcome: Adequate for Discharge   Problem: Education: Goal: Knowledge of General Education information will improve Description: Including pain rating scale, medication(s)/side effects and non-pharmacologic comfort measures Outcome: Adequate for Discharge   Problem: Health Behavior/Discharge Planning: Goal: Ability to manage health-related needs will improve Outcome: Adequate for Discharge   Problem: Clinical Measurements: Goal: Ability to maintain clinical measurements within normal limits will improve Outcome: Adequate for Discharge Goal: Will remain free from infection Outcome: Adequate for Discharge Goal: Diagnostic test results will improve Outcome: Adequate for Discharge Goal: Respiratory complications will improve Outcome: Adequate for Discharge Goal: Cardiovascular complication will be avoided Outcome: Adequate for Discharge   Problem: Activity: Goal: Risk for activity intolerance will decrease Outcome: Adequate for Discharge   Problem: Nutrition: Goal: Adequate nutrition will be maintained Outcome: Adequate for Discharge   Problem: Coping: Goal: Level of anxiety will decrease Outcome: Adequate for Discharge   Problem: Elimination: Goal: Will not experience complications related to bowel motility Outcome: Adequate for Discharge Goal: Will not experience complications related to urinary retention Outcome: Adequate for Discharge   Problem: Pain Managment: Goal: General experience of comfort will improve Outcome: Adequate for Discharge    Problem: Safety: Goal: Ability to remain free from injury will improve Outcome: Adequate for Discharge   Problem: Skin Integrity: Goal: Risk for impaired skin integrity will decrease Outcome: Adequate for Discharge   Problem: Education: Goal: Knowledge of the prescribed therapeutic regimen will improve Outcome: Adequate for Discharge   Problem: Coping: Goal: Ability to identify and develop effective coping behavior will improve Outcome: Adequate for Discharge   Problem: Clinical Measurements: Goal: Quality of life will improve Outcome: Adequate for Discharge   Problem: Respiratory: Goal: Verbalizations of increased ease of respirations will increase Outcome: Adequate for Discharge   Problem: Role Relationship: Goal: Family's ability to cope with current situation will improve Outcome: Adequate for Discharge Goal: Ability to verbalize concerns, feelings, and thoughts to partner or family member will improve Outcome: Adequate for Discharge   Problem: Pain Management: Goal: Satisfaction with pain management regimen will improve Outcome: Adequate for Discharge

## 2023-10-21 LAB — CULTURE, BLOOD (ROUTINE X 2)
Culture: NO GROWTH
Special Requests: ADEQUATE

## 2023-10-22 ENCOUNTER — Telehealth: Payer: Self-pay | Admitting: Podiatry

## 2023-10-22 ENCOUNTER — Ambulatory Visit (INDEPENDENT_AMBULATORY_CARE_PROVIDER_SITE_OTHER): Payer: Medicare Other

## 2023-10-22 DIAGNOSIS — I495 Sick sinus syndrome: Secondary | ICD-10-CM | POA: Diagnosis not present

## 2023-10-22 NOTE — Telephone Encounter (Signed)
Pts daughter called to let you know pt has transitioned to hospice and we have cxled his appt.  I did tell her if they needed anything to let us know. She said thank you

## 2023-10-23 LAB — CUP PACEART REMOTE DEVICE CHECK
Battery Remaining Longevity: 12 mo
Battery Remaining Longevity: 12 mo
Battery Voltage: 2.84 V
Battery Voltage: 2.84 V
Brady Statistic AP VP Percent: 97.03 %
Brady Statistic AP VP Percent: 94.74 %
Brady Statistic AP VS Percent: 0 %
Brady Statistic AP VS Percent: 0 %
Brady Statistic AS VP Percent: 0 %
Brady Statistic AS VP Percent: 0 %
Brady Statistic AS VS Percent: 2.97 %
Brady Statistic AS VS Percent: 5.26 %
Brady Statistic RA Percent Paced: 98.88 %
Brady Statistic RA Percent Paced: 99.93 %
Brady Statistic RV Percent Paced: 97.03 %
Brady Statistic RV Percent Paced: 94.74 %
Date Time Interrogation Session: 20241021181724
Date Time Interrogation Session: 20241023054549
Implantable Lead Connection Status: 753985
Implantable Lead Connection Status: 753985
Implantable Lead Connection Status: 753985
Implantable Lead Connection Status: 753985
Implantable Lead Implant Date: 20191206
Implantable Lead Implant Date: 20191206
Implantable Lead Implant Date: 20191206
Implantable Lead Implant Date: 20191206
Implantable Lead Location: 753860
Implantable Lead Location: 753860
Implantable Lead Location: 753860
Implantable Lead Location: 753860
Implantable Lead Model: 3830
Implantable Lead Model: 5076
Implantable Lead Model: 3830
Implantable Lead Model: 5076
Implantable Pulse Generator Implant Date: 20191206
Implantable Pulse Generator Implant Date: 20191206
Lead Channel Impedance Value: 285 Ohm
Lead Channel Impedance Value: 342 Ohm
Lead Channel Impedance Value: 380 Ohm
Lead Channel Impedance Value: 399 Ohm
Lead Channel Impedance Value: 285 Ohm
Lead Channel Impedance Value: 342 Ohm
Lead Channel Impedance Value: 361 Ohm
Lead Channel Impedance Value: 380 Ohm
Lead Channel Sensing Intrinsic Amplitude: 14.5 mV
Lead Channel Sensing Intrinsic Amplitude: 14.5 mV
Lead Channel Sensing Intrinsic Amplitude: 3.625 mV
Lead Channel Sensing Intrinsic Amplitude: 3.625 mV
Lead Channel Sensing Intrinsic Amplitude: 14.5 mV
Lead Channel Sensing Intrinsic Amplitude: 14.5 mV
Lead Channel Sensing Intrinsic Amplitude: 3.625 mV
Lead Channel Sensing Intrinsic Amplitude: 3.625 mV
Lead Channel Setting Pacing Amplitude: 2 V
Lead Channel Setting Pacing Amplitude: 2.5 V
Lead Channel Setting Pacing Amplitude: 2 V
Lead Channel Setting Pacing Amplitude: 2.5 V
Lead Channel Setting Pacing Pulse Width: 0.4 ms
Lead Channel Setting Pacing Pulse Width: 0.4 ms
Lead Channel Setting Sensing Sensitivity: 1.2 mV
Lead Channel Setting Sensing Sensitivity: 1.2 mV
Zone Setting Status: 755011
Zone Setting Status: 755011
Zone Setting Status: 755011
Zone Setting Status: 755011

## 2023-10-28 ENCOUNTER — Telehealth: Payer: Self-pay | Admitting: Internal Medicine

## 2023-10-28 NOTE — Telephone Encounter (Signed)
Patient's son call to inform Douglas Walker passed Sunday, October 27th and requested to cancel upcoming appointment.

## 2023-10-30 ENCOUNTER — Encounter: Payer: Medicare Other | Admitting: Internal Medicine

## 2023-10-31 DEATH — deceased

## 2023-11-10 ENCOUNTER — Telehealth: Payer: Self-pay | Admitting: Family Medicine

## 2023-11-10 NOTE — Telephone Encounter (Signed)
Error

## 2023-11-11 NOTE — Progress Notes (Signed)
Remote pacemaker transmission.   

## 2023-11-24 ENCOUNTER — Ambulatory Visit: Payer: Medicare Other | Admitting: Podiatry

## 2023-12-09 ENCOUNTER — Ambulatory Visit: Payer: Medicare Other | Admitting: Physician Assistant

## 2023-12-09 ENCOUNTER — Ambulatory Visit: Payer: Medicare Other | Admitting: Internal Medicine

## 2023-12-11 ENCOUNTER — Ambulatory Visit: Payer: Medicare Other | Admitting: Physician Assistant

## 2024-01-21 ENCOUNTER — Ambulatory Visit: Payer: Medicare Other

## 2024-04-21 ENCOUNTER — Ambulatory Visit: Payer: Medicare Other

## 2024-07-22 ENCOUNTER — Ambulatory Visit: Payer: Medicare Other | Admitting: Internal Medicine
